# Patient Record
Sex: Male | Born: 1935
Health system: Southern US, Community
[De-identification: ages and names within clinical notes are randomized; demographics above are authoritative.]

## PROBLEM LIST (undated history)

## (undated) DIAGNOSIS — I5030 Unspecified diastolic (congestive) heart failure: Secondary | ICD-10-CM

## (undated) DIAGNOSIS — I10 Essential (primary) hypertension: Secondary | ICD-10-CM

## (undated) DIAGNOSIS — E119 Type 2 diabetes mellitus without complications: Secondary | ICD-10-CM

## (undated) DIAGNOSIS — G4733 Obstructive sleep apnea (adult) (pediatric): Secondary | ICD-10-CM

## (undated) DIAGNOSIS — E785 Hyperlipidemia, unspecified: Secondary | ICD-10-CM

## (undated) DIAGNOSIS — H269 Unspecified cataract: Secondary | ICD-10-CM

## (undated) DIAGNOSIS — N189 Chronic kidney disease, unspecified: Secondary | ICD-10-CM

## (undated) DIAGNOSIS — G4730 Sleep apnea, unspecified: Secondary | ICD-10-CM

## (undated) DIAGNOSIS — Z9989 Dependence on other enabling machines and devices: Secondary | ICD-10-CM

## (undated) DIAGNOSIS — H409 Unspecified glaucoma: Secondary | ICD-10-CM

## (undated) DIAGNOSIS — I639 Cerebral infarction, unspecified: Secondary | ICD-10-CM

## (undated) DIAGNOSIS — K219 Gastro-esophageal reflux disease without esophagitis: Secondary | ICD-10-CM

## (undated) DIAGNOSIS — I4510 Unspecified right bundle-branch block: Secondary | ICD-10-CM

## (undated) DIAGNOSIS — S88119A Complete traumatic amputation at level between knee and ankle, unspecified lower leg, initial encounter: Secondary | ICD-10-CM

## (undated) DIAGNOSIS — D649 Anemia, unspecified: Secondary | ICD-10-CM

## (undated) DIAGNOSIS — K5790 Diverticulosis of intestine, part unspecified, without perforation or abscess without bleeding: Secondary | ICD-10-CM

## (undated) DIAGNOSIS — E079 Disorder of thyroid, unspecified: Secondary | ICD-10-CM

## (undated) HISTORY — DX: Cerebral infarction, unspecified: I63.9

## (undated) HISTORY — DX: Hyperlipidemia, unspecified: E78.5

## (undated) HISTORY — DX: Essential (primary) hypertension: I10

## (undated) HISTORY — DX: Sleep apnea, unspecified: G47.30

## (undated) HISTORY — PX: EYE SURGERY: SHX253

## (undated) HISTORY — DX: Gastro-esophageal reflux disease without esophagitis: K21.9

## (undated) HISTORY — DX: Unspecified glaucoma: H40.9

## (undated) HISTORY — PX: LEG AMPUTATION BELOW KNEE: SHX694

## (undated) HISTORY — DX: Unspecified right bundle-branch block: I45.10

## (undated) HISTORY — DX: Disorder of thyroid, unspecified: E07.9

## (undated) HISTORY — DX: Type 2 diabetes mellitus without complications: E11.9

## (undated) HISTORY — DX: Anemia, unspecified: D64.9

## (undated) HISTORY — DX: Unspecified diastolic (congestive) heart failure: I50.30

## (undated) HISTORY — DX: Chronic kidney disease, unspecified: N18.9

## (undated) HISTORY — DX: Unspecified cataract: H26.9

## (undated) HISTORY — DX: Obstructive sleep apnea (adult) (pediatric): Z99.89

## (undated) HISTORY — DX: Obstructive sleep apnea (adult) (pediatric): G47.33

## (undated) HISTORY — DX: Complete traumatic amputation at level between knee and ankle, unspecified lower leg, initial encounter: S88.119A

---

## 1998-12-02 ENCOUNTER — Encounter: Admission: RE | Admit: 1998-12-02 | Discharge: 1998-12-31 | Payer: Self-pay | Admitting: Internal Medicine

## 1998-12-16 ENCOUNTER — Emergency Department (HOSPITAL_COMMUNITY): Admission: EM | Admit: 1998-12-16 | Discharge: 1998-12-16 | Payer: Self-pay | Admitting: Emergency Medicine

## 1998-12-18 ENCOUNTER — Inpatient Hospital Stay (HOSPITAL_COMMUNITY): Admission: EM | Admit: 1998-12-18 | Discharge: 1998-12-24 | Payer: Self-pay | Admitting: Emergency Medicine

## 1998-12-19 ENCOUNTER — Encounter: Payer: Self-pay | Admitting: General Surgery

## 1998-12-25 ENCOUNTER — Encounter: Admission: RE | Admit: 1998-12-25 | Discharge: 1999-03-25 | Payer: Self-pay

## 1999-01-01 ENCOUNTER — Encounter: Payer: Self-pay | Admitting: General Surgery

## 1999-01-01 ENCOUNTER — Inpatient Hospital Stay (HOSPITAL_COMMUNITY): Admission: RE | Admit: 1999-01-01 | Discharge: 1999-01-27 | Payer: Self-pay | Admitting: General Surgery

## 1999-01-05 ENCOUNTER — Encounter: Payer: Self-pay | Admitting: General Surgery

## 1999-01-13 ENCOUNTER — Encounter: Admission: RE | Admit: 1999-01-13 | Discharge: 1999-03-15 | Payer: Self-pay | Admitting: Internal Medicine

## 1999-01-26 ENCOUNTER — Encounter: Payer: Self-pay | Admitting: General Surgery

## 1999-03-01 ENCOUNTER — Inpatient Hospital Stay (HOSPITAL_COMMUNITY): Admission: RE | Admit: 1999-03-01 | Discharge: 1999-03-13 | Payer: Self-pay | Admitting: General Surgery

## 1999-03-09 ENCOUNTER — Encounter: Payer: Self-pay | Admitting: General Surgery

## 1999-03-10 ENCOUNTER — Encounter: Payer: Self-pay | Admitting: General Surgery

## 1999-03-29 ENCOUNTER — Encounter: Admission: RE | Admit: 1999-03-29 | Discharge: 1999-06-27 | Payer: Self-pay | Admitting: General Surgery

## 1999-03-31 ENCOUNTER — Encounter: Admission: RE | Admit: 1999-03-31 | Discharge: 1999-06-29 | Payer: Self-pay | Admitting: Orthopedic Surgery

## 1999-05-12 ENCOUNTER — Encounter: Admission: RE | Admit: 1999-05-12 | Discharge: 1999-08-10 | Payer: Self-pay | Admitting: Endocrinology

## 1999-05-27 ENCOUNTER — Encounter
Admission: RE | Admit: 1999-05-27 | Discharge: 1999-07-01 | Payer: Self-pay | Admitting: Physical Medicine and Rehabilitation

## 1999-06-29 ENCOUNTER — Encounter: Admission: RE | Admit: 1999-06-29 | Discharge: 1999-09-27 | Payer: Self-pay | Admitting: Internal Medicine

## 1999-09-28 ENCOUNTER — Encounter: Admission: RE | Admit: 1999-09-28 | Discharge: 1999-12-27 | Payer: Self-pay | Admitting: Internal Medicine

## 2000-01-05 ENCOUNTER — Encounter: Admission: RE | Admit: 2000-01-05 | Discharge: 2000-01-11 | Payer: Self-pay | Admitting: Internal Medicine

## 2000-04-12 ENCOUNTER — Encounter: Admission: RE | Admit: 2000-04-12 | Discharge: 2000-07-11 | Payer: Self-pay | Admitting: Orthopedic Surgery

## 2000-08-01 ENCOUNTER — Encounter: Admission: RE | Admit: 2000-08-01 | Discharge: 2000-08-07 | Payer: Self-pay | Admitting: Orthopedic Surgery

## 2000-08-09 ENCOUNTER — Encounter: Admission: RE | Admit: 2000-08-09 | Discharge: 2000-11-07 | Payer: Self-pay | Admitting: Endocrinology

## 2000-11-08 ENCOUNTER — Encounter: Admission: RE | Admit: 2000-11-08 | Discharge: 2000-11-13 | Payer: Self-pay | Admitting: Internal Medicine

## 2001-02-28 ENCOUNTER — Encounter: Admission: RE | Admit: 2001-02-28 | Discharge: 2001-03-05 | Payer: Self-pay | Admitting: Internal Medicine

## 2001-06-20 ENCOUNTER — Encounter: Admission: RE | Admit: 2001-06-20 | Discharge: 2001-06-25 | Payer: Self-pay | Admitting: Internal Medicine

## 2001-09-18 ENCOUNTER — Encounter (HOSPITAL_BASED_OUTPATIENT_CLINIC_OR_DEPARTMENT_OTHER): Admission: RE | Admit: 2001-09-18 | Discharge: 2001-09-24 | Payer: Self-pay | Admitting: Orthopedic Surgery

## 2001-12-21 ENCOUNTER — Encounter (HOSPITAL_BASED_OUTPATIENT_CLINIC_OR_DEPARTMENT_OTHER): Admission: RE | Admit: 2001-12-21 | Discharge: 2001-12-28 | Payer: Self-pay | Admitting: Internal Medicine

## 2002-03-28 ENCOUNTER — Encounter (HOSPITAL_BASED_OUTPATIENT_CLINIC_OR_DEPARTMENT_OTHER): Admission: RE | Admit: 2002-03-28 | Discharge: 2002-06-26 | Payer: Self-pay | Admitting: Internal Medicine

## 2002-07-16 ENCOUNTER — Encounter (HOSPITAL_BASED_OUTPATIENT_CLINIC_OR_DEPARTMENT_OTHER): Admission: RE | Admit: 2002-07-16 | Discharge: 2002-10-14 | Payer: Self-pay | Admitting: Internal Medicine

## 2002-10-17 ENCOUNTER — Encounter (HOSPITAL_BASED_OUTPATIENT_CLINIC_OR_DEPARTMENT_OTHER): Admission: RE | Admit: 2002-10-17 | Discharge: 2003-01-15 | Payer: Self-pay | Admitting: Internal Medicine

## 2003-01-17 ENCOUNTER — Encounter (HOSPITAL_BASED_OUTPATIENT_CLINIC_OR_DEPARTMENT_OTHER): Admission: RE | Admit: 2003-01-17 | Discharge: 2003-02-10 | Payer: Self-pay | Admitting: Internal Medicine

## 2003-05-15 ENCOUNTER — Encounter (HOSPITAL_BASED_OUTPATIENT_CLINIC_OR_DEPARTMENT_OTHER): Admission: RE | Admit: 2003-05-15 | Discharge: 2003-05-28 | Payer: Self-pay | Admitting: Internal Medicine

## 2003-08-21 ENCOUNTER — Encounter (HOSPITAL_BASED_OUTPATIENT_CLINIC_OR_DEPARTMENT_OTHER): Admission: RE | Admit: 2003-08-21 | Discharge: 2003-09-04 | Payer: Self-pay | Admitting: Internal Medicine

## 2003-11-25 ENCOUNTER — Encounter (HOSPITAL_BASED_OUTPATIENT_CLINIC_OR_DEPARTMENT_OTHER): Admission: RE | Admit: 2003-11-25 | Discharge: 2004-02-23 | Payer: Self-pay | Admitting: Internal Medicine

## 2004-03-03 ENCOUNTER — Encounter (HOSPITAL_BASED_OUTPATIENT_CLINIC_OR_DEPARTMENT_OTHER): Admission: RE | Admit: 2004-03-03 | Discharge: 2004-03-17 | Payer: Self-pay | Admitting: Internal Medicine

## 2004-06-16 ENCOUNTER — Encounter (HOSPITAL_BASED_OUTPATIENT_CLINIC_OR_DEPARTMENT_OTHER): Admission: RE | Admit: 2004-06-16 | Discharge: 2004-07-06 | Payer: Self-pay | Admitting: Internal Medicine

## 2009-09-18 HISTORY — PX: NM MYOCAR PERF WALL MOTION: HXRAD629

## 2010-06-13 ENCOUNTER — Emergency Department (HOSPITAL_COMMUNITY)
Admission: EM | Admit: 2010-06-13 | Discharge: 2010-06-13 | Payer: Self-pay | Source: Home / Self Care | Admitting: Emergency Medicine

## 2010-06-13 IMAGING — CR DG KNEE COMPLETE 4+V*L*
4 series · 4 of 4 positions shown · non-contrast
Comparison: None.

CLINICAL DATA: Left knee pain and swelling.

LEFT KNEE - COMPLETE 4+ VIEW

[t knee ap left]
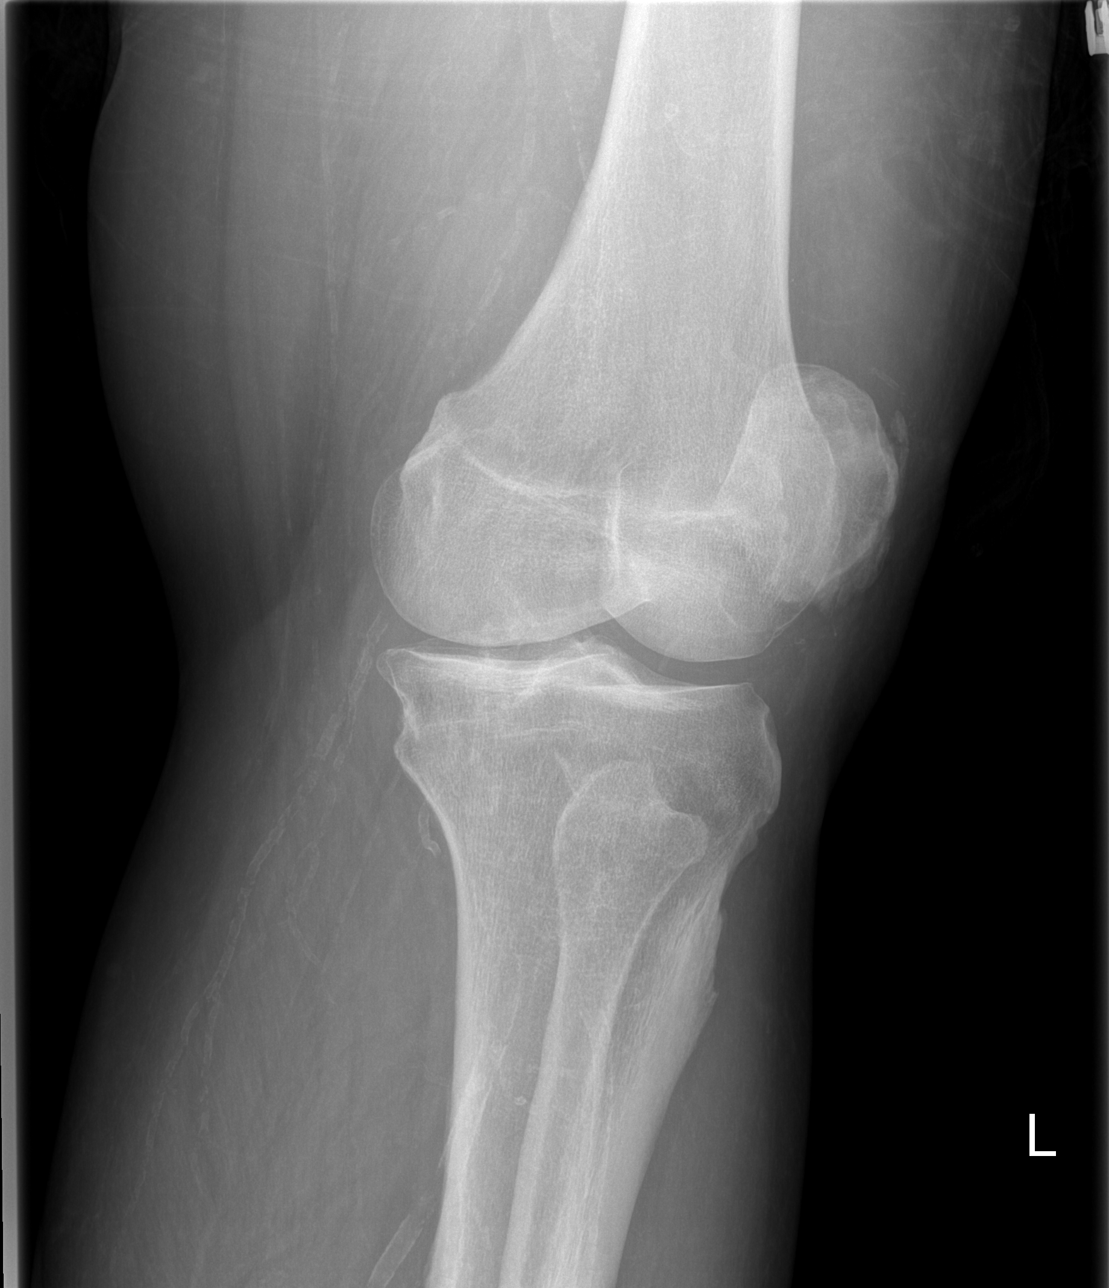

[t knee oblique left (1 of 2)]
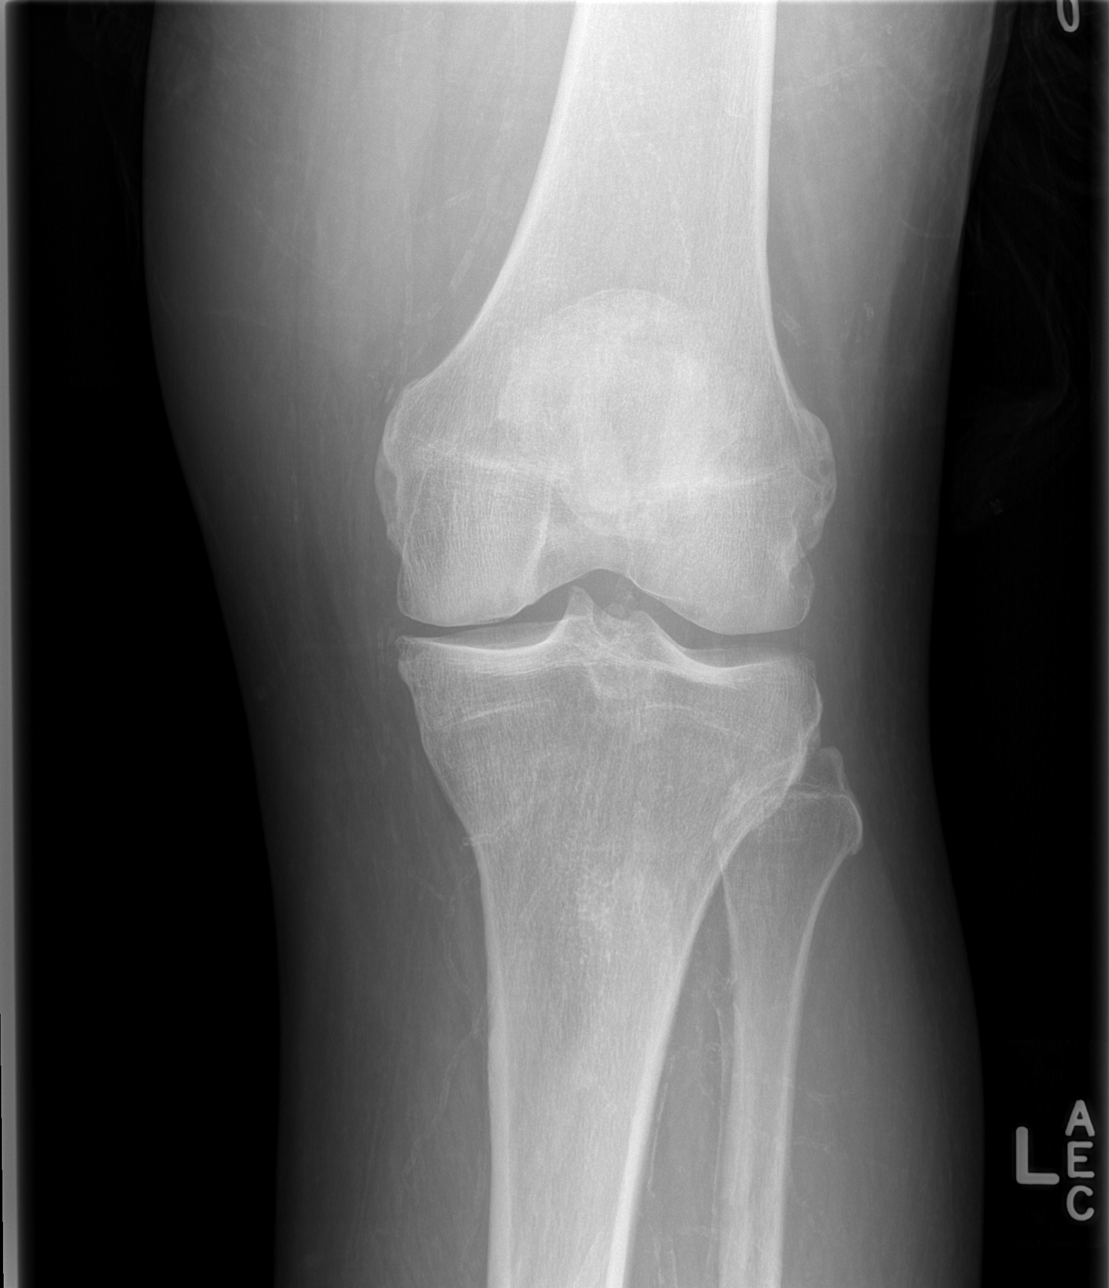

[t knee oblique left (2 of 2)]
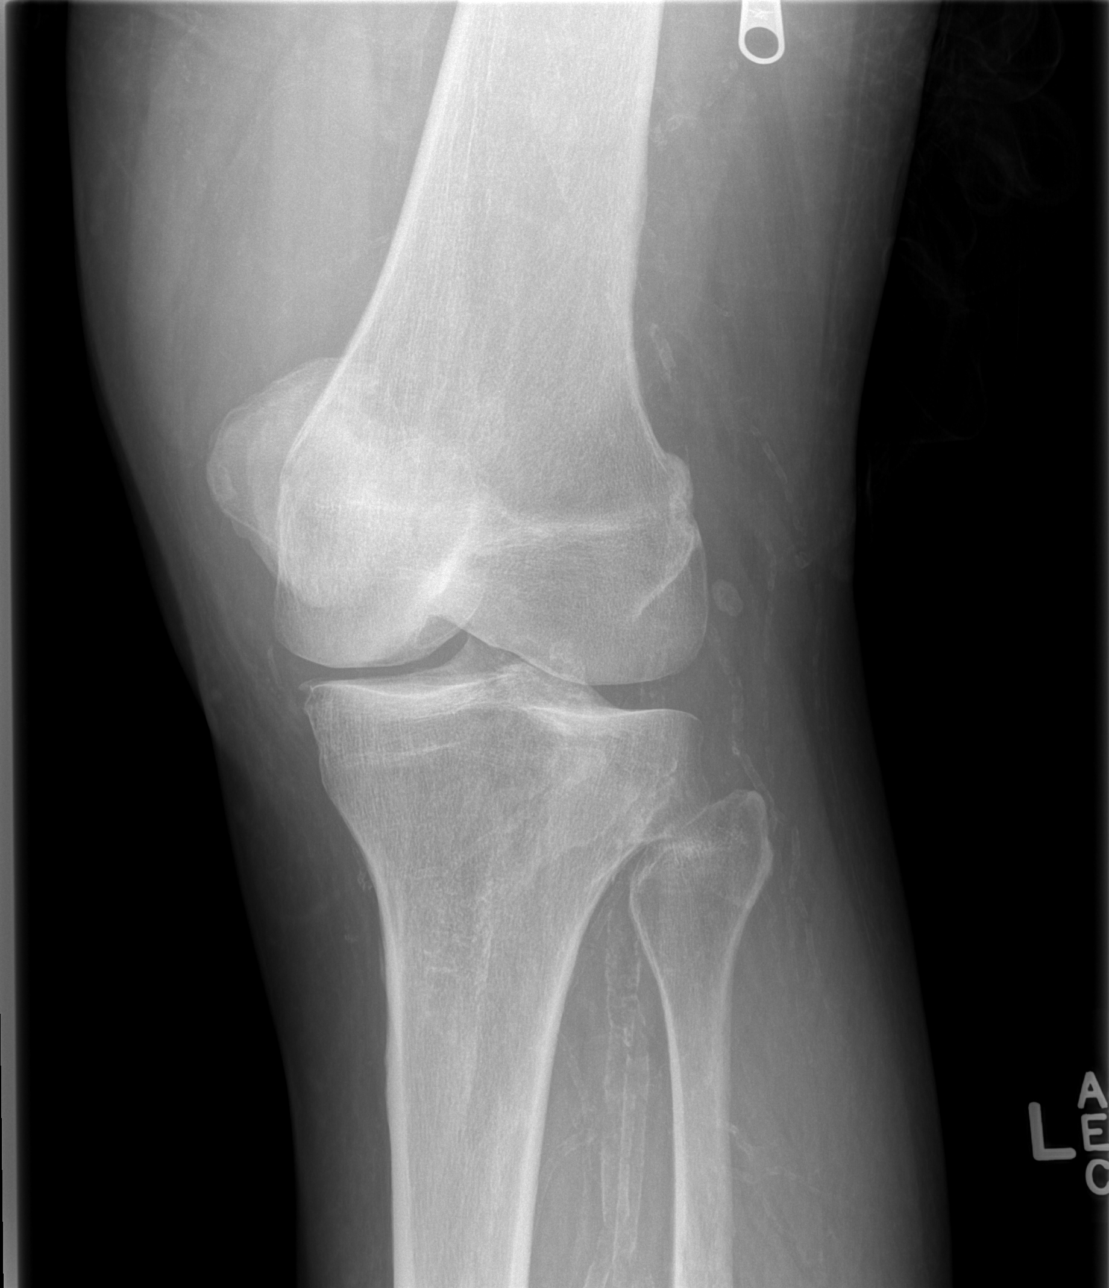

[view not recorded]
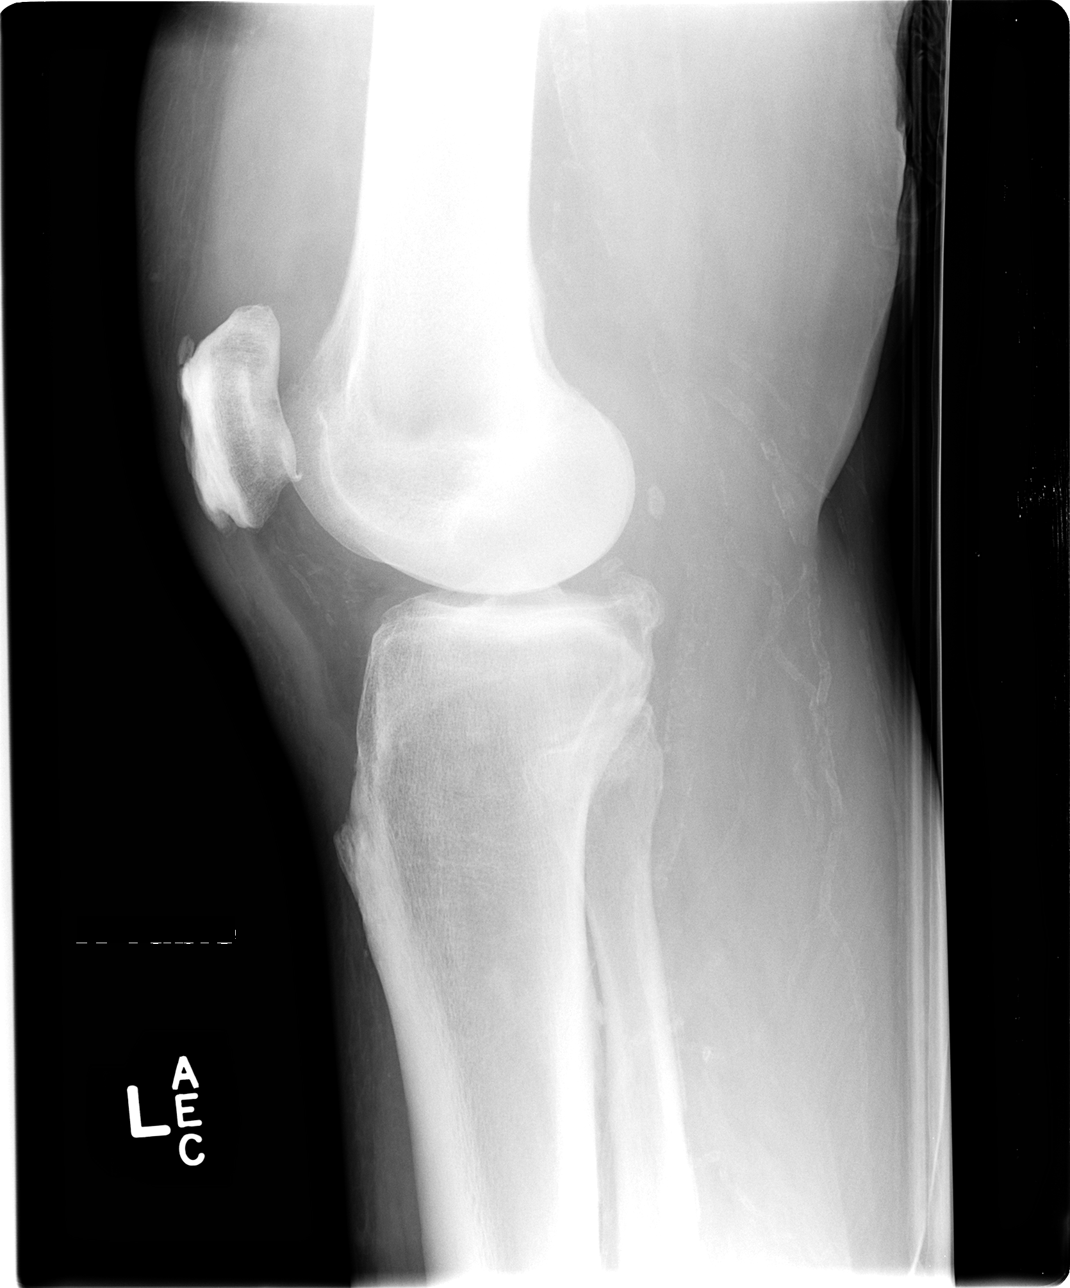

[4 of 4 positions shown; findings below may reference images not displayed]

FINDINGS: There is moderately severe medial joint space narrowing
with associated proliferative changes.  Patellofemoral
proliferative changes and proliferation of the tibial spines also
noted.  There is an associated suprapatellar joint effusion.
Findings are consistent with osteoarthritis.  No fracture,
dislocation or bony lesion is identified.  There is extensive
vascular calcification throughout the visualized soft tissues
likely consistent with calcifications related to underlying
diabetes.
IMPRESSION: Osteoarthritis primarily affecting the medial joint space.  There
also is patellofemoral disease and a suprapatellar joint effusion.

## 2011-01-08 ENCOUNTER — Inpatient Hospital Stay (HOSPITAL_COMMUNITY)
Admission: EM | Admit: 2011-01-08 | Discharge: 2011-01-14 | DRG: 811 | Disposition: A | Discharge status: Home / Self Care | Payer: Medicare Other | Attending: Internal Medicine | Admitting: Internal Medicine

## 2011-01-08 DIAGNOSIS — N179 Acute kidney failure, unspecified: Secondary | ICD-10-CM | POA: Diagnosis present

## 2011-01-08 DIAGNOSIS — R791 Abnormal coagulation profile: Secondary | ICD-10-CM | POA: Diagnosis present

## 2011-01-08 DIAGNOSIS — G4733 Obstructive sleep apnea (adult) (pediatric): Secondary | ICD-10-CM | POA: Diagnosis present

## 2011-01-08 DIAGNOSIS — T45515A Adverse effect of anticoagulants, initial encounter: Secondary | ICD-10-CM | POA: Diagnosis present

## 2011-01-08 DIAGNOSIS — D62 Acute posthemorrhagic anemia: Principal | ICD-10-CM | POA: Diagnosis present

## 2011-01-08 DIAGNOSIS — D126 Benign neoplasm of colon, unspecified: Secondary | ICD-10-CM | POA: Diagnosis present

## 2011-01-08 DIAGNOSIS — R195 Other fecal abnormalities: Secondary | ICD-10-CM | POA: Diagnosis present

## 2011-01-08 DIAGNOSIS — N183 Chronic kidney disease, stage 3 unspecified: Secondary | ICD-10-CM | POA: Diagnosis present

## 2011-01-08 DIAGNOSIS — M109 Gout, unspecified: Secondary | ICD-10-CM | POA: Diagnosis present

## 2011-01-08 DIAGNOSIS — K6381 Dieulafoy lesion of intestine: Secondary | ICD-10-CM | POA: Diagnosis present

## 2011-01-08 DIAGNOSIS — Z8673 Personal history of transient ischemic attack (TIA), and cerebral infarction without residual deficits: Secondary | ICD-10-CM

## 2011-01-08 DIAGNOSIS — R0602 Shortness of breath: Secondary | ICD-10-CM | POA: Diagnosis present

## 2011-01-08 DIAGNOSIS — I129 Hypertensive chronic kidney disease with stage 1 through stage 4 chronic kidney disease, or unspecified chronic kidney disease: Secondary | ICD-10-CM | POA: Diagnosis present

## 2011-01-08 DIAGNOSIS — E785 Hyperlipidemia, unspecified: Secondary | ICD-10-CM | POA: Diagnosis present

## 2011-01-08 HISTORY — DX: Diverticulosis of intestine, part unspecified, without perforation or abscess without bleeding: K57.90

## 2011-01-08 LAB — COMPREHENSIVE METABOLIC PANEL
ALT: 15 U/L (ref 0–53)
AST: 20 U/L (ref 0–37)
Albumin: 2.8 g/dL — ABNORMAL LOW (ref 3.5–5.2)
Alkaline Phosphatase: 43 U/L (ref 39–117)
BUN: 71 mg/dL — ABNORMAL HIGH (ref 6–23)
CO2: 23 mEq/L (ref 19–32)
Calcium: 9.3 mg/dL (ref 8.4–10.5)
Chloride: 107 mEq/L (ref 96–112)
Creatinine, Ser: 2.1 mg/dL — ABNORMAL HIGH (ref 0.50–1.35)
GFR calc Af Amer: 38 mL/min — ABNORMAL LOW (ref >60)
GFR calc non Af Amer: 31 mL/min — ABNORMAL LOW (ref >60)
Glucose, Bld: 254 mg/dL — ABNORMAL HIGH (ref 70–99)
Potassium: 4.1 mEq/L (ref 3.5–5.1)
Sodium: 137 mEq/L (ref 135–145)
Total Bilirubin: 0.2 mg/dL — ABNORMAL LOW (ref 0.3–1.2)
Total Protein: 6.3 g/dL (ref 6.0–8.3)

## 2011-01-08 LAB — URINALYSIS, ROUTINE W REFLEX MICROSCOPIC
Bilirubin Urine: NEGATIVE
Glucose, UA: 250 mg/dL — AB
Hgb urine dipstick: NEGATIVE
Ketones, ur: NEGATIVE mg/dL
Leukocytes, UA: NEGATIVE
Nitrite: NEGATIVE
Protein, ur: NEGATIVE mg/dL
Specific Gravity, Urine: 1.018 (ref 1.005–1.030)
Urobilinogen, UA: 0.2 mg/dL (ref 0.0–1.0)
pH: 5.5 (ref 5.0–8.0)

## 2011-01-08 LAB — ABO/RH: ABO/RH(D): B POS

## 2011-01-08 LAB — MRSA PCR SCREENING: MRSA by PCR: NEGATIVE

## 2011-01-08 LAB — CBC
HCT: 12.3 % — ABNORMAL LOW (ref 39.0–52.0)
Hemoglobin: 3.8 g/dL — CL (ref 13.0–17.0)
MCH: 26.4 pg (ref 26.0–34.0)
MCHC: 30.9 g/dL (ref 30.0–36.0)
MCV: 85.4 fL (ref 78.0–100.0)
Platelets: 197 10*3/uL (ref 150–400)
RBC: 1.44 MIL/uL — ABNORMAL LOW (ref 4.22–5.81)
RDW: 18.3 % — ABNORMAL HIGH (ref 11.5–15.5)
WBC: 8.9 10*3/uL (ref 4.0–10.5)

## 2011-01-08 LAB — DIFFERENTIAL
Basophils Absolute: 0 10*3/uL (ref 0.0–0.1)
Basophils Relative: 0 % (ref 0–1)
Eosinophils Absolute: 0.2 10*3/uL (ref 0.0–0.7)
Eosinophils Relative: 2 % (ref 0–5)
Lymphocytes Relative: 17 % (ref 12–46)
Lymphs Abs: 1.5 10*3/uL (ref 0.7–4.0)
Monocytes Absolute: 0.5 10*3/uL (ref 0.1–1.0)
Monocytes Relative: 6 % (ref 3–12)
Neutro Abs: 6.7 10*3/uL (ref 1.7–7.7)
Neutrophils Relative %: 75 % (ref 43–77)

## 2011-01-08 LAB — PROTIME-INR
INR: 6.09 (ref 0.00–1.49)
Prothrombin Time: 55 seconds — ABNORMAL HIGH (ref 11.6–15.2)

## 2011-01-09 ENCOUNTER — Inpatient Hospital Stay (HOSPITAL_COMMUNITY): Payer: Medicare Other

## 2011-01-09 DIAGNOSIS — I517 Cardiomegaly: Secondary | ICD-10-CM

## 2011-01-09 LAB — GLUCOSE, CAPILLARY
Glucose-Capillary: 194 mg/dL — ABNORMAL HIGH (ref 70–99)
Glucose-Capillary: 193 mg/dL — ABNORMAL HIGH (ref 70–99)

## 2011-01-09 LAB — CARDIAC PANEL(CRET KIN+CKTOT+MB+TROPI)
CK, MB: 3.6 ng/mL (ref 0.3–4.0)
CK, MB: 3.8 ng/mL (ref 0.3–4.0)
CK, MB: 4.7 ng/mL — ABNORMAL HIGH (ref 0.3–4.0)
Relative Index: 1.4 (ref 0.0–2.5)
Relative Index: 1.6 (ref 0.0–2.5)
Relative Index: 1.6 (ref 0.0–2.5)
Total CK: 240 U/L — ABNORMAL HIGH (ref 7–232)
Total CK: 255 U/L — ABNORMAL HIGH (ref 7–232)
Total CK: 296 U/L — ABNORMAL HIGH (ref 7–232)
Troponin I: <0.3 ng/mL (ref <0.30)
Troponin I: <0.3 ng/mL (ref <0.30)
Troponin I: <0.3 ng/mL (ref <0.30)

## 2011-01-09 LAB — CBC
HCT: 16.7 % — ABNORMAL LOW (ref 39.0–52.0)
HCT: 19.3 % — ABNORMAL LOW (ref 39.0–52.0)
Hemoglobin: 5.3 g/dL — CL (ref 13.0–17.0)
Hemoglobin: 6.1 g/dL — CL (ref 13.0–17.0)
MCH: 26.5 pg (ref 26.0–34.0)
MCH: 26.9 pg (ref 26.0–34.0)
MCHC: 31.6 g/dL (ref 30.0–36.0)
MCHC: 31.7 g/dL (ref 30.0–36.0)
MCV: 83.9 fL (ref 78.0–100.0)
MCV: 84.8 fL (ref 78.0–100.0)
Platelets: 177 10*3/uL (ref 150–400)
Platelets: 178 10*3/uL (ref 150–400)
RBC: 1.97 MIL/uL — ABNORMAL LOW (ref 4.22–5.81)
RBC: 2.3 MIL/uL — ABNORMAL LOW (ref 4.22–5.81)
RDW: 17.2 % — ABNORMAL HIGH (ref 11.5–15.5)
RDW: 17.3 % — ABNORMAL HIGH (ref 11.5–15.5)
WBC: 7.5 10*3/uL (ref 4.0–10.5)
WBC: 8 10*3/uL (ref 4.0–10.5)

## 2011-01-09 LAB — COMPREHENSIVE METABOLIC PANEL
ALT: 16 U/L (ref 0–53)
AST: 18 U/L (ref 0–37)
Albumin: 2.8 g/dL — ABNORMAL LOW (ref 3.5–5.2)
Alkaline Phosphatase: 41 U/L (ref 39–117)
BUN: 55 mg/dL — ABNORMAL HIGH (ref 6–23)
CO2: 25 mEq/L (ref 19–32)
Calcium: 9 mg/dL (ref 8.4–10.5)
Chloride: 109 mEq/L (ref 96–112)
Creatinine, Ser: 1.75 mg/dL — ABNORMAL HIGH (ref 0.50–1.35)
GFR calc Af Amer: 46 mL/min — ABNORMAL LOW (ref >60)
GFR calc non Af Amer: 38 mL/min — ABNORMAL LOW (ref >60)
Glucose, Bld: 170 mg/dL — ABNORMAL HIGH (ref 70–99)
Potassium: 3.6 mEq/L (ref 3.5–5.1)
Sodium: 141 mEq/L (ref 135–145)
Total Bilirubin: 0.4 mg/dL (ref 0.3–1.2)
Total Protein: 6.3 g/dL (ref 6.0–8.3)

## 2011-01-09 LAB — PREPARE RBC (CROSSMATCH)

## 2011-01-09 LAB — MAGNESIUM: Magnesium: 2 mg/dL (ref 1.5–2.5)

## 2011-01-09 LAB — PRO B NATRIURETIC PEPTIDE: Pro B Natriuretic peptide (BNP): 197.2 pg/mL — ABNORMAL HIGH (ref 0–125)

## 2011-01-09 LAB — HEMOGLOBIN AND HEMATOCRIT, BLOOD
HCT: 21.9 % — ABNORMAL LOW (ref 39.0–52.0)
Hemoglobin: 7.2 g/dL — ABNORMAL LOW (ref 13.0–17.0)

## 2011-01-09 LAB — HEMOGLOBIN A1C
Hgb A1c MFr Bld: 6.7 % — ABNORMAL HIGH (ref <5.7)
Mean Plasma Glucose: 146 mg/dL — ABNORMAL HIGH (ref <117)

## 2011-01-09 LAB — TSH: TSH: 2.817 u[IU]/mL (ref 0.350–4.500)

## 2011-01-09 LAB — PROTIME-INR
INR: 1.81 — ABNORMAL HIGH (ref 0.00–1.49)
Prothrombin Time: 21.3 seconds — ABNORMAL HIGH (ref 11.6–15.2)

## 2011-01-09 LAB — PHOSPHORUS: Phosphorus: 3.7 mg/dL (ref 2.3–4.6)

## 2011-01-09 IMAGING — CR DG CHEST 1V PORT
1 series · 1 of 1 positions shown · non-contrast
Comparison: None.

CLINICAL DATA: Shortness of breath

PORTABLE CHEST - 1 VIEW

[view not recorded]
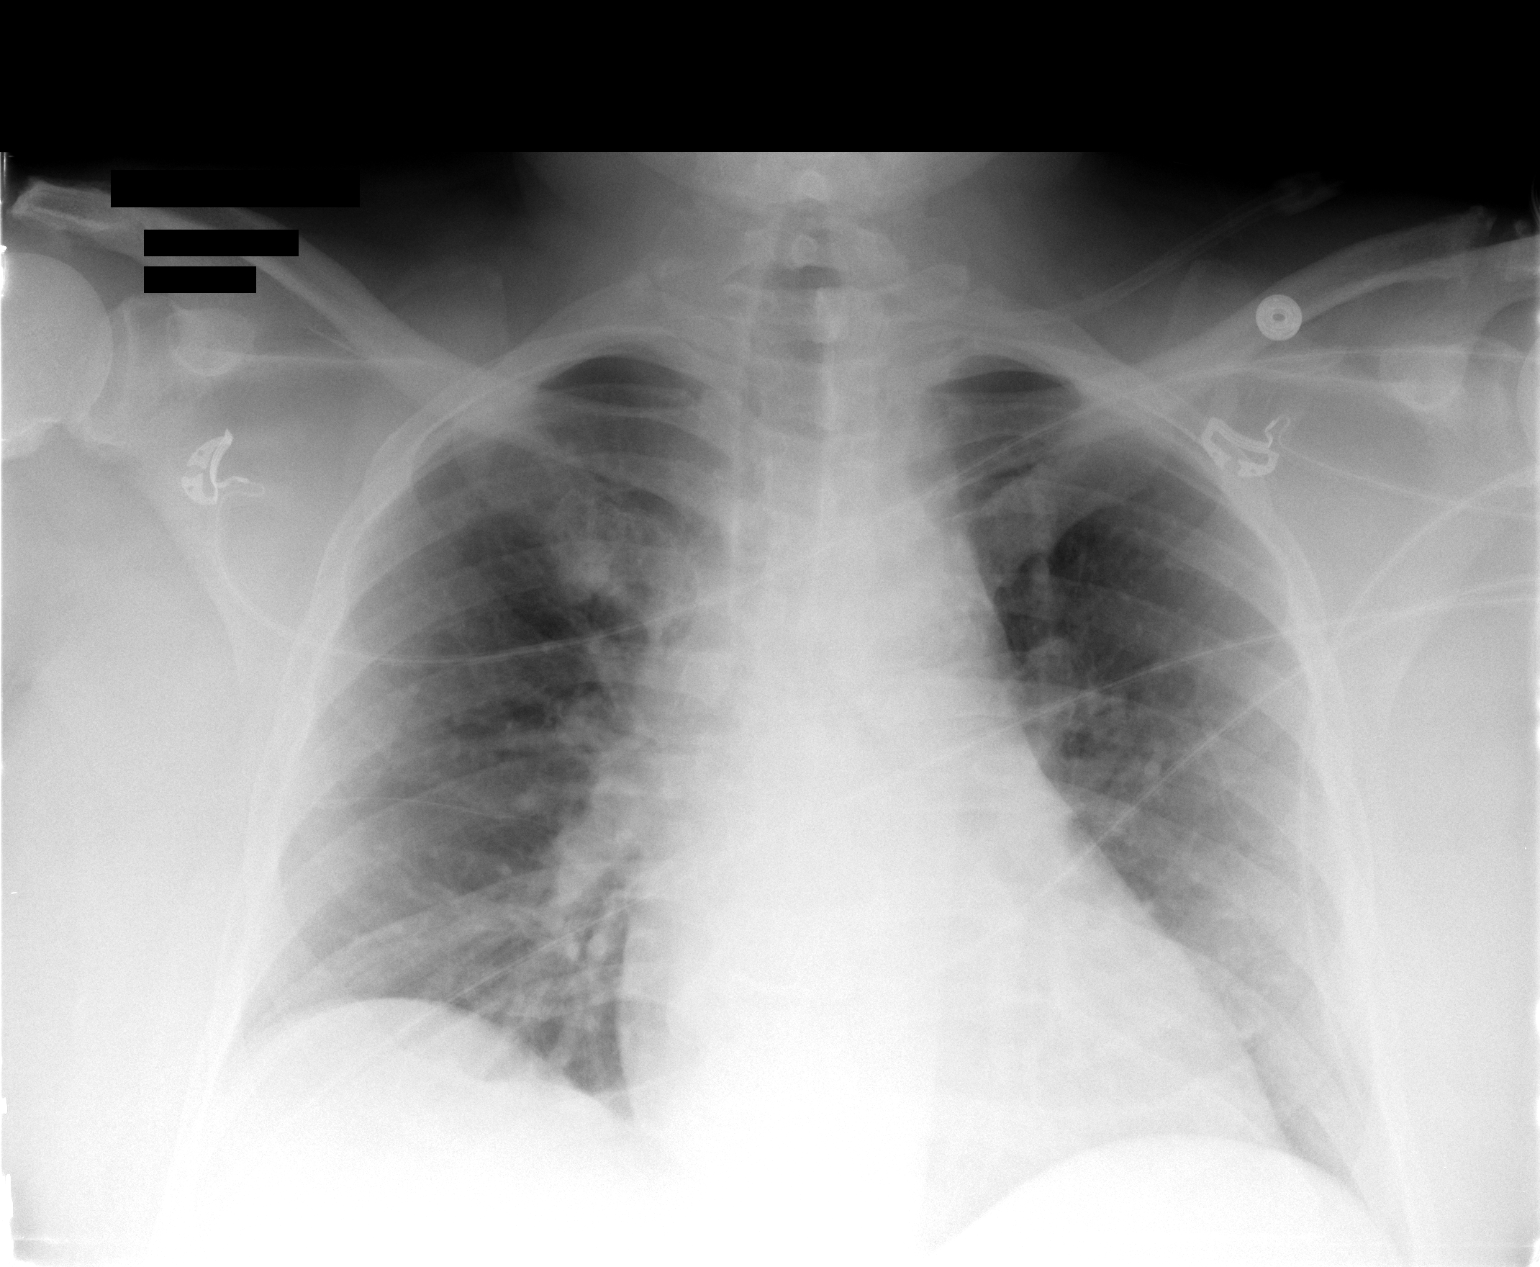

[1 of 1 positions shown; findings below may reference images not displayed]

FINDINGS: Borderline heart size and central vascular congestion.
Negative for CHF, definite pneumonia, significant effusion or
pneumothorax.
IMPRESSION: Negative for acute pneumonia or CHF.

## 2011-01-10 LAB — PREPARE FRESH FROZEN PLASMA
Unit division: 0
Unit division: 0

## 2011-01-10 LAB — TYPE AND SCREEN
ABO/RH(D): B POS
Antibody Screen: NEGATIVE
Unit division: 0
Unit division: 0
Unit division: 0
Unit division: 0

## 2011-01-10 LAB — BASIC METABOLIC PANEL
BUN: 40 mg/dL — ABNORMAL HIGH (ref 6–23)
CO2: 27 mEq/L (ref 19–32)
Calcium: 9 mg/dL (ref 8.4–10.5)
Chloride: 108 mEq/L (ref 96–112)
Creatinine, Ser: 1.53 mg/dL — ABNORMAL HIGH (ref 0.50–1.35)
GFR calc Af Amer: 54 mL/min — ABNORMAL LOW (ref >60)
GFR calc non Af Amer: 45 mL/min — ABNORMAL LOW (ref >60)
Glucose, Bld: 89 mg/dL (ref 70–99)
Potassium: 3.6 mEq/L (ref 3.5–5.1)
Sodium: 141 mEq/L (ref 135–145)

## 2011-01-10 LAB — GLUCOSE, CAPILLARY
Glucose-Capillary: 233 mg/dL — ABNORMAL HIGH (ref 70–99)
Glucose-Capillary: 137 mg/dL — ABNORMAL HIGH (ref 70–99)
Glucose-Capillary: 101 mg/dL — ABNORMAL HIGH (ref 70–99)
Glucose-Capillary: 122 mg/dL — ABNORMAL HIGH (ref 70–99)

## 2011-01-10 LAB — OCCULT BLOOD X 1 CARD TO LAB, STOOL: Fecal Occult Bld: POSITIVE

## 2011-01-10 LAB — HEMOGLOBIN AND HEMATOCRIT, BLOOD
HCT: 21.9 % — ABNORMAL LOW (ref 39.0–52.0)
HCT: 22.7 % — ABNORMAL LOW (ref 39.0–52.0)
Hemoglobin: 7.2 g/dL — ABNORMAL LOW (ref 13.0–17.0)
Hemoglobin: 7.3 g/dL — ABNORMAL LOW (ref 13.0–17.0)

## 2011-01-10 LAB — OCCULT BLOOD, POC DEVICE: Fecal Occult Bld: POSITIVE

## 2011-01-11 ENCOUNTER — Other Ambulatory Visit: Payer: Self-pay | Admitting: Gastroenterology

## 2011-01-11 LAB — GLUCOSE, CAPILLARY
Glucose-Capillary: 97 mg/dL (ref 70–99)
Glucose-Capillary: 99 mg/dL (ref 70–99)
Glucose-Capillary: 134 mg/dL — ABNORMAL HIGH (ref 70–99)
Glucose-Capillary: 130 mg/dL — ABNORMAL HIGH (ref 70–99)
Glucose-Capillary: 99 mg/dL (ref 70–99)

## 2011-01-11 LAB — CBC
HCT: 22.7 % — ABNORMAL LOW (ref 39.0–52.0)
Hemoglobin: 7.3 g/dL — ABNORMAL LOW (ref 13.0–17.0)
MCH: 27 pg (ref 26.0–34.0)
MCHC: 32.2 g/dL (ref 30.0–36.0)
MCV: 84.1 fL (ref 78.0–100.0)
Platelets: 199 10*3/uL (ref 150–400)
RBC: 2.7 MIL/uL — ABNORMAL LOW (ref 4.22–5.81)
RDW: 16.7 % — ABNORMAL HIGH (ref 11.5–15.5)
WBC: 8.5 10*3/uL (ref 4.0–10.5)

## 2011-01-11 LAB — BASIC METABOLIC PANEL
BUN: 23 mg/dL (ref 6–23)
CO2: 25 mEq/L (ref 19–32)
Calcium: 8.9 mg/dL (ref 8.4–10.5)
Chloride: 106 mEq/L (ref 96–112)
Creatinine, Ser: 1.42 mg/dL — ABNORMAL HIGH (ref 0.50–1.35)
GFR calc Af Amer: 59 mL/min — ABNORMAL LOW (ref >60)
GFR calc non Af Amer: 49 mL/min — ABNORMAL LOW (ref >60)
Glucose, Bld: 100 mg/dL — ABNORMAL HIGH (ref 70–99)
Potassium: 3.7 mEq/L (ref 3.5–5.1)
Sodium: 138 mEq/L (ref 135–145)

## 2011-01-12 ENCOUNTER — Encounter (HOSPITAL_COMMUNITY): Payer: Self-pay | Admitting: Radiology

## 2011-01-12 ENCOUNTER — Inpatient Hospital Stay (HOSPITAL_COMMUNITY): Payer: Medicare Other

## 2011-01-12 LAB — GLUCOSE, CAPILLARY
Glucose-Capillary: 112 mg/dL — ABNORMAL HIGH (ref 70–99)
Glucose-Capillary: 121 mg/dL — ABNORMAL HIGH (ref 70–99)
Glucose-Capillary: 236 mg/dL — ABNORMAL HIGH (ref 70–99)
Glucose-Capillary: 169 mg/dL — ABNORMAL HIGH (ref 70–99)

## 2011-01-12 LAB — BASIC METABOLIC PANEL
BUN: 27 mg/dL — ABNORMAL HIGH (ref 6–23)
CO2: 23 mEq/L (ref 19–32)
Calcium: 8.7 mg/dL (ref 8.4–10.5)
Chloride: 105 mEq/L (ref 96–112)
Creatinine, Ser: 1.87 mg/dL — ABNORMAL HIGH (ref 0.50–1.35)
GFR calc Af Amer: 43 mL/min — ABNORMAL LOW (ref >60)
GFR calc non Af Amer: 36 mL/min — ABNORMAL LOW (ref >60)
Glucose, Bld: 104 mg/dL — ABNORMAL HIGH (ref 70–99)
Potassium: 4 mEq/L (ref 3.5–5.1)
Sodium: 139 mEq/L (ref 135–145)

## 2011-01-12 LAB — CBC
HCT: 21.6 % — ABNORMAL LOW (ref 39.0–52.0)
Hemoglobin: 6.8 g/dL — CL (ref 13.0–17.0)
MCH: 26.5 pg (ref 26.0–34.0)
MCHC: 31.5 g/dL (ref 30.0–36.0)
MCV: 84 fL (ref 78.0–100.0)
Platelets: 173 10*3/uL (ref 150–400)
RBC: 2.57 MIL/uL — ABNORMAL LOW (ref 4.22–5.81)
RDW: 16.2 % — ABNORMAL HIGH (ref 11.5–15.5)
WBC: 10 10*3/uL (ref 4.0–10.5)

## 2011-01-12 LAB — HEMOGLOBIN AND HEMATOCRIT, BLOOD
HCT: 24.4 % — ABNORMAL LOW (ref 39.0–52.0)
Hemoglobin: 8.1 g/dL — ABNORMAL LOW (ref 13.0–17.0)

## 2011-01-12 IMAGING — CT CT ABD-PELV W/O CM
2 of 4 series · 16 of 46 positions shown, 18 images · non-contrast
Comparison: None.

CLINICAL DATA: 75-year-old with low hemoglobin and melanotic stool.
History of diverticulosis

CT ABDOMEN AND PELVIS WITHOUT CONTRAST
TECHNIQUE: Multidetector CT imaging of the abdomen and pelvis was
performed following the standard protocol without intravenous
contrast. Oral contrast was administered for this exam.

[Series 2: rtn ap without · axial · non-contrast · 0.85mm/px · z∈[+699,+1184]mm · 13 of 107 slices shown, 15 images]
[im 5/107  soft-tissue]
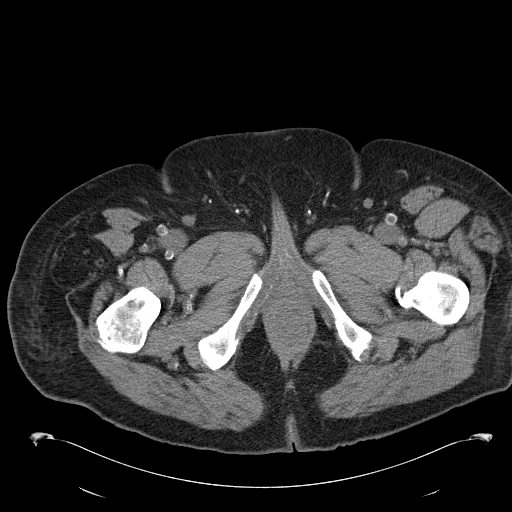
[im 5/107  bone]
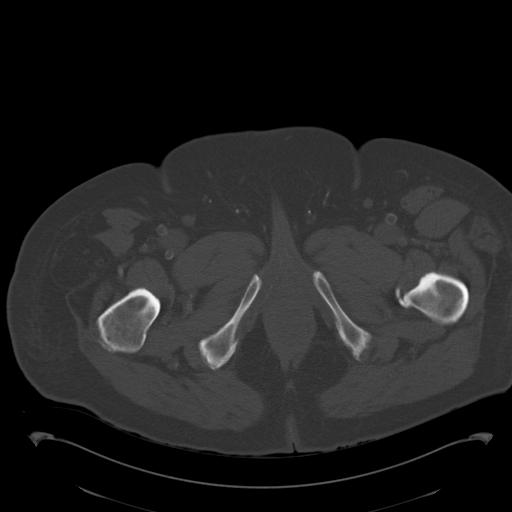
[im 13/107  soft-tissue]
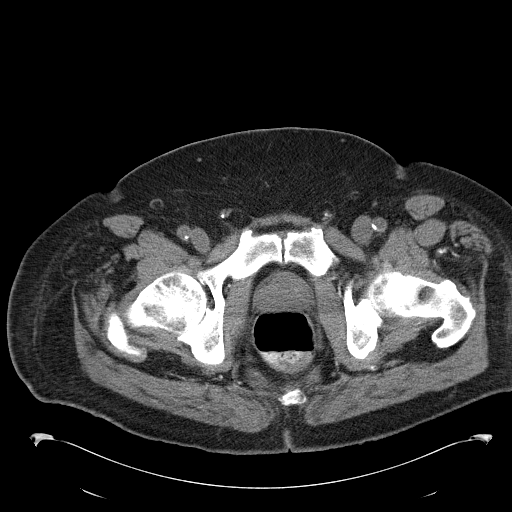
[im 22/107  soft-tissue]
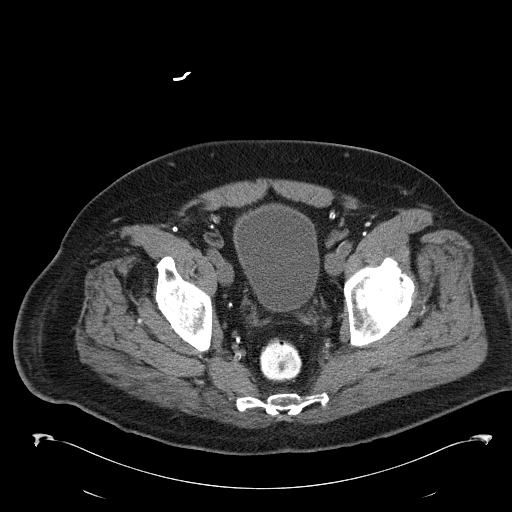
[im 30/107  soft-tissue]
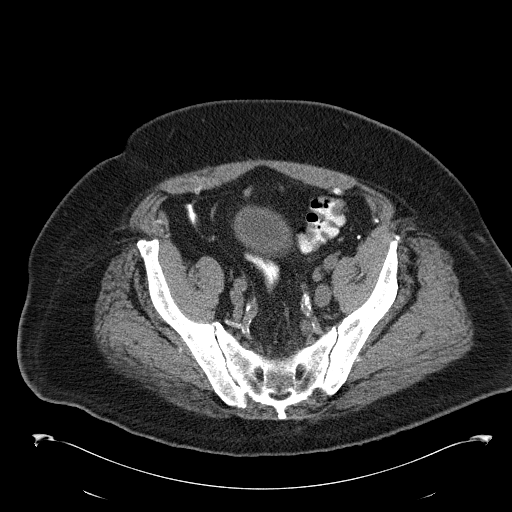
[im 39/107  soft-tissue]
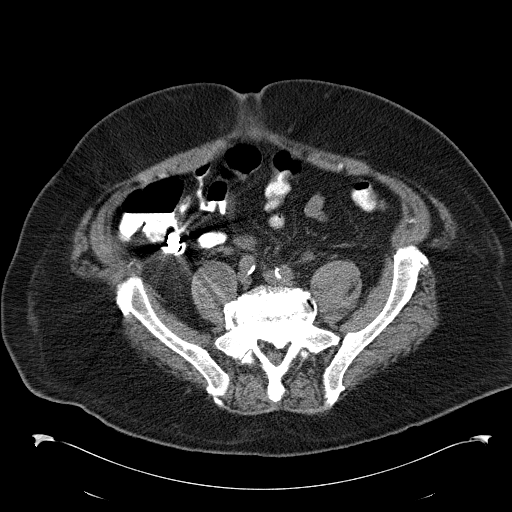
[im 47/107  soft-tissue]
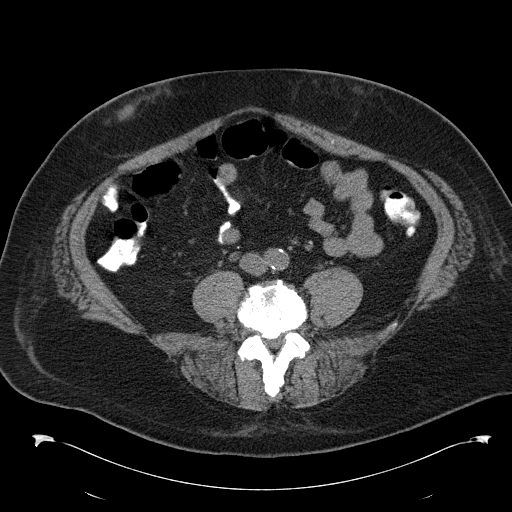
[im 56/107  soft-tissue]
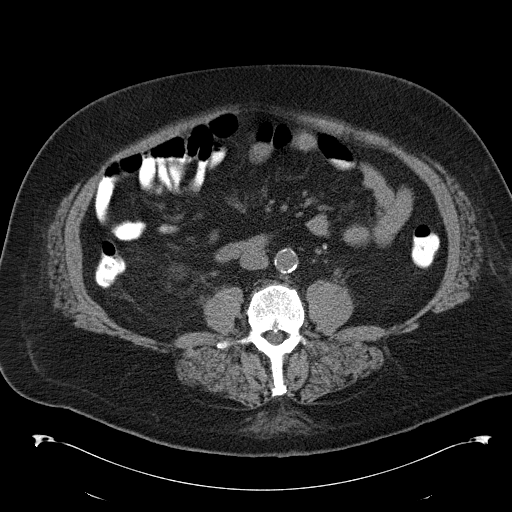
[im 60/107  soft-tissue]
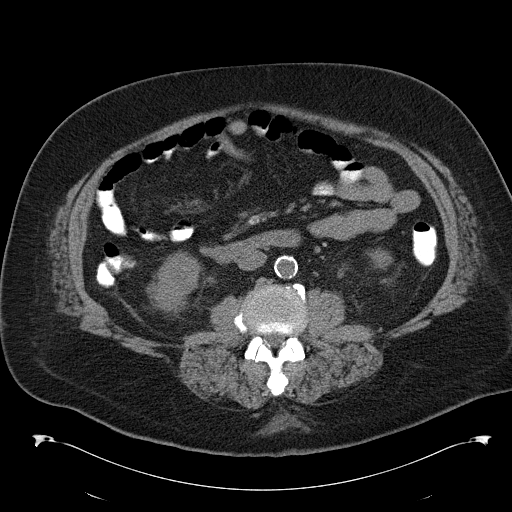
[im 68/107  soft-tissue]
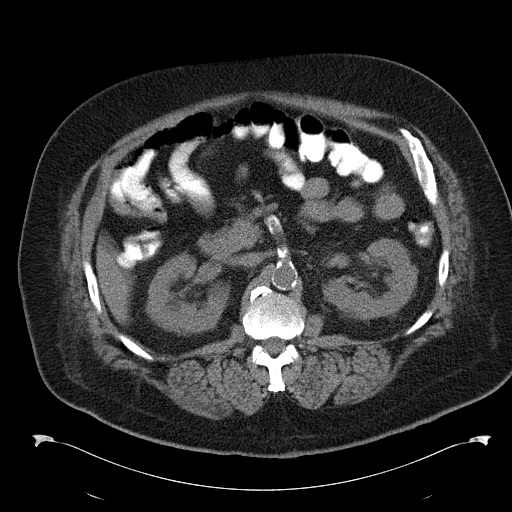
[im 68/107  bone]
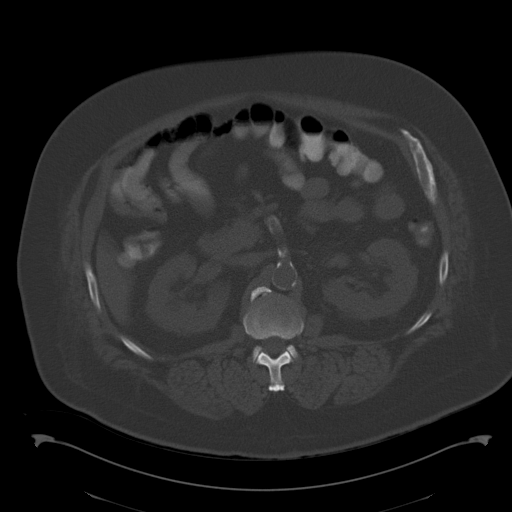
[im 77/107  soft-tissue]
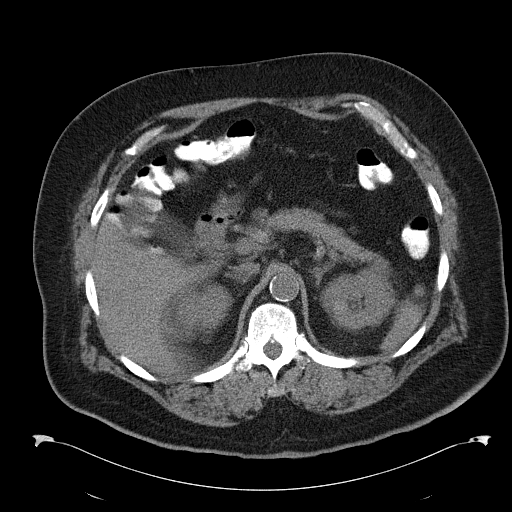
[im 85/107  soft-tissue]
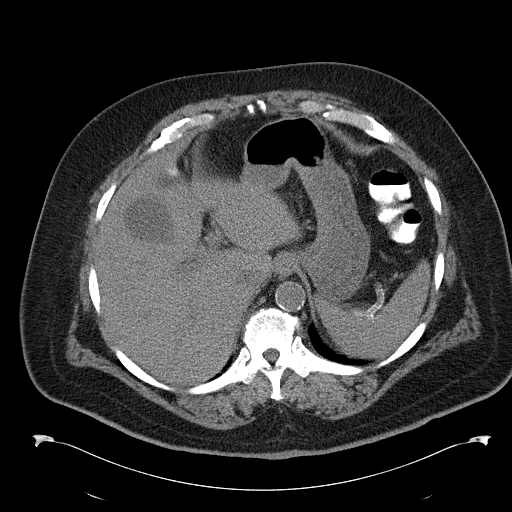
[im 94/107  soft-tissue]
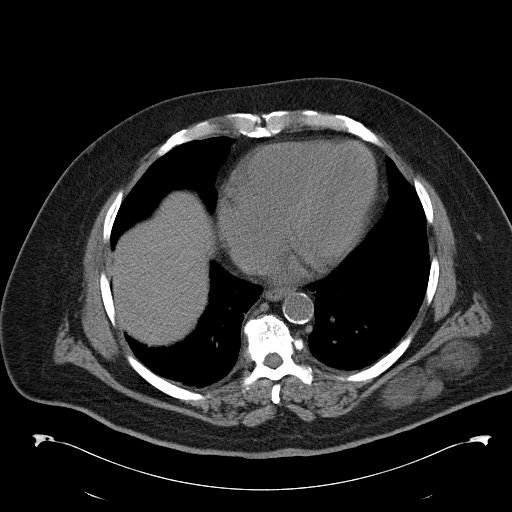
[im 102/107  soft-tissue]
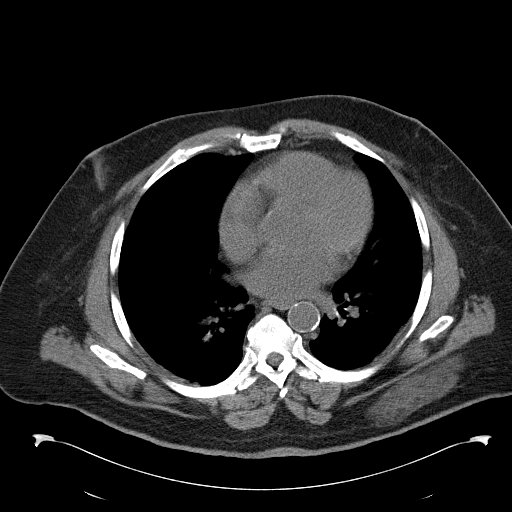

[Series 602: coronal images · coronal · 1.08mm/px · 3 of 111 slices shown]
[im 37/111  soft-tissue]
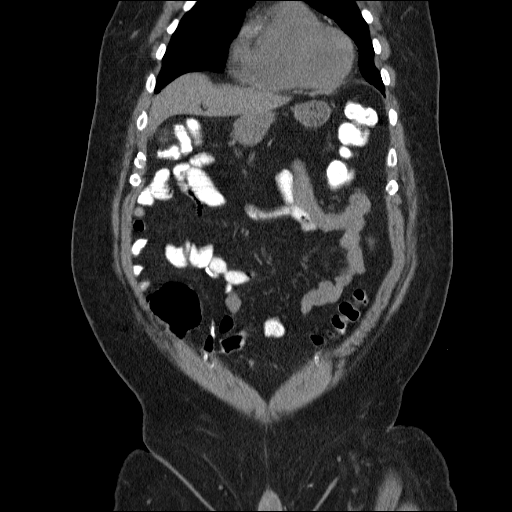
[im 49/111  soft-tissue]
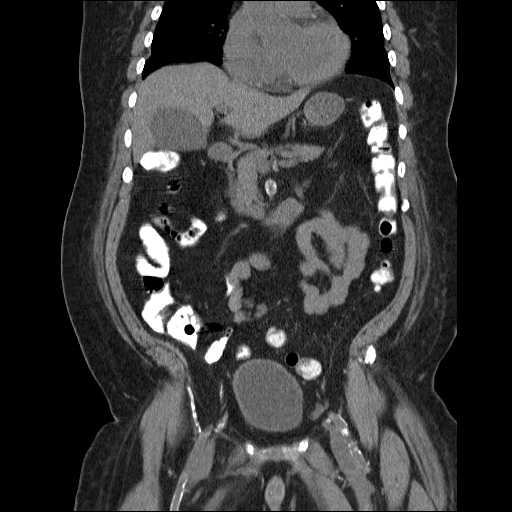
[im 62/111  soft-tissue]
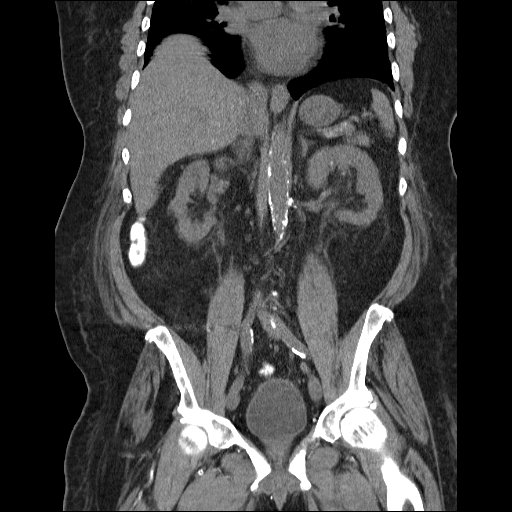

[16 of 46 positions shown; findings below may reference images not displayed]

FINDINGS: There is respiratory motion at the lung bases.  Minimal
atelectasis in the lower lobes.  No consolidation.

Heart size appears within normal limits.

Majority of the contrast is in the distal small bowel and the colon
at the time of the examination.  The stomach is decompressed and
does not contain oral contrast.  No focal abnormality the stomach
is identified on noncontrast imaging.  Small bowel loops are normal
in caliber and wall thickness.  The colon is normal in caliber and
wall thickness.  The rectum has normal wall thickness.  No
intraluminal masses are seen within the bowel loops. Evaluation for
intraluminal masses of the unopacified small bowel loops is
markedly limited.

No appendix is identified.  No evidence of acute appendicitis.
There is mild diverticulosis of the colon.  No evidence of acute
diverticulitis.

There is marked atherosclerotic calcification of the aorta, as well
as the superior mesenteric artery, inferior mesenteric artery,
splenic artery, internal iliac arteries systems, and origin of both
renal arteries, right greater than left.  There is mild scattered
atherosclerotic calcification of the external iliac arteries.
Extensive atherosclerotic calcification of the proximal femoral
arteries bilaterally. No evidence of aneurysm.

The noncontrast appearance of the liver, gallbladder, spleen,
adrenal glands, pancreas, and kidneys is within normal limits.  The
ureters are normal in caliber.  The urinary bladder, prostate
gland, and seminal vesicles appear within normal limits.

Negative for retroperitoneal or mesenteric lymphadenopathy.

Stranding in the anterior abdominal wall bilaterally likely
reflects injection sites.

Negative for free fluid or abscess.

Prominent posterior osseous spurring at L5-S1 causes spinal
stenosis, particularly on the left, where there is left neural
foraminal narrowing.  Vertebral bodies are normal in height and
alignment.  No suspicious bony abnormality.
IMPRESSION: 1. No mass, obstruction, or acute inflammatory changes of the
gastrointestinal tract identified on noncontrast CT
2.  Mild colonic diverticulosis.
3.  Extensive atherosclerosis as described above.
4.  Marked degenerative changes at L5-S1 with spinal stenosis and
narrowing of the left neural foramen.

## 2011-01-12 NOTE — Consult Note (Signed)
Micheal Walls, SUSMAN NO.:  [id number]  MEDICAL RECORD NO.:  [id number]  LOCATION:  52                         FACILITY:  [facility]  PHYSICIAN:  Ronald Lobo, M.D.   DATE OF BIRTH:  Jan 11, 1936  DATE OF CONSULTATION: DATE OF DISCHARGE:                                CONSULTATION   REFERRING PHYSICIAN:  Reyne Dumas, MD  Dr. [name] of the [facility] hospitalist asked [place] to see this 75 year old patient because of GI bleeding and profound anemia.  The patient is on aspirin and Coumadin, but has no prior history of GI bleeding.  He began having dark stools about a week ago and began feeling weak several days ago.  He never had any dyspeptic symptoms.  He came to the emergency room and was found to be profoundly anemic, with a hemoglobin of 3.8, MCV 85.  On top of that, his BUN was markedly elevated at 71 and his INR was out at 6.1.  In the emergency room, he received FFP 2 units, vitamin K 5 mg, and he is now on his second unit of blood.  He is hemodynamically stable.  His main symptom, other than weakness, was shortness of breath.  At this time, he does not appear to be short of breath nor is he having any pain.  PAST MEDICAL HISTORY:  No known allergies.  OUTPATIENT MEDICATIONS: 1. Aspirin 81 mg twice a day. 2. Coumadin. 3. Humulin insulin. 4. Diltiazem. 5. Ramipril. 6. Allopurinol. 7. Crestor. 8. Lasix.  OPERATIONS:  Right BKA and appendectomy.  CHRONIC MEDICAL ILLNESSES: 1. Diabetes of approximately 30 years' duration. 2. Hypertension. 3. History of CVA which is why he is on the Coumadin. 4. Gout. 5. Dyslipidemia. 6. No known cardiopulmonary disease.  HABITS:  Nonsmoker, nondrinker.  FAMILY HISTORY:  Negative for GI illnesses.  SOCIAL HISTORY:  The patient is married, but his wife lives up in [place].  A cousin lives with him.  He is retired from the [employer].  REVIEW OF SYSTEMS:  Note that the patient had a colonoscopy earlier  this year through our office and I believe it was negative.  He has good appetite and no active upper tract symptoms.  PHYSICAL EXAMINATION:  GENERAL:  Pertinent for strongly heme-positive stool.  A very pleasant, substantially overweight African American male, in no evident distress. HEENT:  He is anicteric. CHEST:  Clear. HEART:  Normal, without murmurs or arrhythmias. ABDOMEN:  Obese, but without guarding, mass effect, or tenderness. RECTAL:  Heme-positive stool in the emergency room by the admitting physician or the ER physician.  LABORATORY DATA:  White count 8900, hemoglobin 3.8, MCV 85, RDW elevated at 18, platelets 197,000.  Differential count normal.  Prothrombin time is 55 seconds with an INR of 6.1.  BUN 71, creatinine 2.0, glucose 254. Electrolytes normal.  Liver chemistries normal.  Albumin 2.8. Urinalysis clear.  IMPRESSION:  Subacute upper gastrointestinal bleed, characterized by melenic stools and rise in BUN, in a person on aspirin and Coumadin. The presumption is that he has an aspirin-induced ulcer or gastropathy, with bleeding magnified by over anticoagulation.  PLAN:  Endoscopic evaluation tomorrow after he has had further transfusion.  The hope is  that he will be medically stronger to undergo the procedure tomorrow.  The nature, purpose, and risks of the procedure were reviewed with the patient and his family and he is agreeable to proceed.          ______________________________ Ronald Lobo, M.D.     RB/MEDQ  D:  01/08/2011  T:  01/08/2011  Job:  [id number]  cc:   Theda Belfast. Baird Cancer, M.D. Fax: [phone]  Electronically Signed by Ronald Lobo M.D. on 01/12/2011 03:04:42 PM

## 2011-01-12 NOTE — Op Note (Signed)
Micheal Walls, Micheal Walls             ACCOUNT NO.:  [id number]  MEDICAL RECORD NO.:  [id number]  LOCATION:  4496                         FACILITY:  [facility]  PHYSICIAN:  Ronald Lobo, M.D.   DATE OF BIRTH:  [birth year]  DATE OF PROCEDURE:  01/10/2011 DATE OF DISCHARGE:                              OPERATIVE REPORT   PROCEDURE PERFORMED:  Colonoscopy with polypectomy.  INDICATIONS:  [age]-year-old gentleman, who was on aspirin and Coumadin and came into the hospital with melenic stool, weakness, and a hemoglobin of 3.8.  He underwent endoscopy, which was negative.  This is being done to look for an alternative source of bleeding, particularly in the proximal lower GI tract.  FINDINGS:  No blood or definite source of bleeding seen.  Diverticulosis and small polyps present.  DESCRIPTION OF PROCEDURE:  The nature, purpose and risks of the procedure had been discussed with the patient, who provided written consent.  The procedure was done at the bedside in the step-down unit. Sedation was fentanyl 25 mcg and Versed 5 mg IV without arrhythmias, desaturation or clinical instability during the course of the procedure. Digital exam of the prostate was normal.  The Pentax adult video colonoscope was advanced without significant difficulty around the colon to the area just above the cecum, using a little bit of external abdominal compression to get the tip of the scope to enter the base of the cecum, after which pullback was performed.  The terminal ileum was entered for short distance and appeared completely normal.  The quality of the prep was quite good and it is not felt that any significant lesions would have been missed, although small or focal lesions might have escaped detection due to the presence of small puddles of liquid stool.  There were two small cysts sessile or semipedunculated polyps identified and removed on this exam using cold snare technique, the first in  the proximal ascending colon, the second at about 20 cm from the external anal opening.  There was minimal blood loss with each snare.  The polyps were successfully retrieved for histologic analysis.  The patient had at least one diverticulum in the ascending colon and mild-to-moderate left-sided diverticulosis starting in the region of the splenic flexure.  Note that there was no blood whatsoever in the colonic lumen at the start of this exam nor was any prospective bleeding site identified. Retroflexion in the rectum was normal..  There was just a small amount of rectum obscured by liquid stool.  The patient tolerated the procedure well and there no apparent complications.  IMPRESSION: 1. No bleeding or blood in the colon at the time of this exam. 2. Two small polyps removed by cold snare technique. 3. Rare right-sided diverticulum and moderate left-sided     diverticulosis. 4. No definite source of the patient's recent gastrointestinal bleed     identified on this exam, although it is conceivable that it could     have been a diverticular hemorrhage, especially from the proximal     colon.  PLAN:  Await pathology on the polyps and proceed with capsule endoscopy in the morning to exclude a small bowel source of bleeding.  ______________________________ Ronald Lobo, M.D.     RB/MEDQ  D:  01/10/2011  T:  01/10/2011  Job:  [id number]  cc:   Theda Belfast. Baird Cancer, M.D. Fax: [phone]  Electronically Signed by Ronald Lobo M.D. on 01/12/2011 03:04:51 PM

## 2011-01-12 NOTE — Op Note (Signed)
  [name], Micheal Walls             ACCOUNT NO.:  [id number]  MEDICAL RECORD NO.:  [id number]  LOCATION:  1740                         FACILITY:  [facility]  PHYSICIAN:  Ronald Lobo, M.D.   DATE OF BIRTH:  Aug 24, 1935  DATE OF PROCEDURE:  01/09/2011 DATE OF DISCHARGE:                              OPERATIVE REPORT   PROCEDURE:  Upper endoscopy.  INDICATIONS:  A 75 year old gentleman on aspirin and Coumadin who presented to the hospital yesterday with a several-day history of dark stools and progressive shortness of breath and weakness and was strongly Hemoccult positive with a hemoglobin of 3.8 and an elevated BUN.  FINDINGS:  Normal exam.  PROCEDURE:  The nature, purpose, and risks of the procedure have been reviewed with the patient who provided written consent.  The procedure was done at the bedside in the step-down unit.  Sedation was Versed 6 mg IV (no fentanyl) with no problems with sleep apnea, desaturation, or clinical instability.  The Pentax adult video endoscope was passed under direct vision.  The vocal cords and larynx looked normal.  The esophagus was easily entered and was normal and its entirety.  Specifically, no Mallory-Weiss tear, reflux esophagitis, neoplasia, infection, varices, ring stricture or hiatal hernia were appreciated.  The stomach was entered.  It contained a small bilious residual, with absolutely no coffee-ground material or blood present.  The stomach was remarkably normal despite the patient's history of aspirin exposure.  There were a few dots of punctate erythema in the fundic region, not felt to be pathologic.  I did not see any gastritis, erosions, ulcers, polyps, or masses; and there was no evidence of pathology on retroflex viewing, including the absence of gastric varices or hiatal hernia.  The pylorus, duodenal bulb, and second duodenum looked normal.  The normal findings were somewhat of a surprise, so I reinspected all areas,  again encountering no pathology or source of bleeding.  The scope was then removed from the patient.  He tolerated the procedure well and without apparent complication.  IMPRESSION:  Normal upper endoscopy, without source of significant blood loss identified.  DISCUSSION:  The differential diagnosis of GI bleeding with a negative endoscopy would include a missed lesion, an evanescent lesion such as a Dieulafoy ulcer which has subsequently resolved, hemosuccus pancreaticus, hemobilia, bleeding from the small bowel such as vascular ectasia or diverticular change, or a proximal colonic source of bleeding, keeping in mind that the patient had a negative colonoscopy through our office earlier this year.  PLAN:  We will proceed for colonoscopic evaluation tomorrow to confirm the absence of proximal colonic pathology, such as a cecal ulceration from his aspirin.  If that is negative, I would then consider a small bowel capsule endoscopy and/or an abdominal CT scan.          ______________________________ Ronald Lobo, M.D.     RB/MEDQ  D:  01/09/2011  T:  01/09/2011  Job:  [id number]  cc:   Theda Belfast. Baird Cancer, M.D. Fax: [phone]  Electronically Signed by Ronald Lobo M.D. on 01/12/2011 03:04:46 PM

## 2011-01-13 LAB — CBC
HCT: 25.2 % — ABNORMAL LOW (ref 39.0–52.0)
Hemoglobin: 8.1 g/dL — ABNORMAL LOW (ref 13.0–17.0)
MCH: 26.6 pg (ref 26.0–34.0)
MCHC: 32.1 g/dL (ref 30.0–36.0)
MCV: 82.9 fL (ref 78.0–100.0)
Platelets: 184 10*3/uL (ref 150–400)
RBC: 3.04 MIL/uL — ABNORMAL LOW (ref 4.22–5.81)
RDW: 15.6 % — ABNORMAL HIGH (ref 11.5–15.5)
WBC: 10.4 10*3/uL (ref 4.0–10.5)

## 2011-01-13 LAB — CROSSMATCH
ABO/RH(D): B POS
Antibody Screen: NEGATIVE
Unit division: 0
Unit division: 0

## 2011-01-13 LAB — BASIC METABOLIC PANEL
BUN: 30 mg/dL — ABNORMAL HIGH (ref 6–23)
CO2: 25 mEq/L (ref 19–32)
Calcium: 8.5 mg/dL (ref 8.4–10.5)
Chloride: 105 mEq/L (ref 96–112)
Creatinine, Ser: 1.79 mg/dL — ABNORMAL HIGH (ref 0.50–1.35)
GFR calc Af Amer: 45 mL/min — ABNORMAL LOW (ref >60)
GFR calc non Af Amer: 37 mL/min — ABNORMAL LOW (ref >60)
Glucose, Bld: 138 mg/dL — ABNORMAL HIGH (ref 70–99)
Potassium: 4 mEq/L (ref 3.5–5.1)
Sodium: 137 mEq/L (ref 135–145)

## 2011-01-13 LAB — GLUCOSE, CAPILLARY
Glucose-Capillary: 135 mg/dL — ABNORMAL HIGH (ref 70–99)
Glucose-Capillary: 182 mg/dL — ABNORMAL HIGH (ref 70–99)
Glucose-Capillary: 171 mg/dL — ABNORMAL HIGH (ref 70–99)
Glucose-Capillary: 157 mg/dL — ABNORMAL HIGH (ref 70–99)

## 2011-01-14 LAB — GLUCOSE, CAPILLARY
Glucose-Capillary: 140 mg/dL — ABNORMAL HIGH (ref 70–99)
Glucose-Capillary: 198 mg/dL — ABNORMAL HIGH (ref 70–99)

## 2011-01-14 LAB — BASIC METABOLIC PANEL
BUN: 36 mg/dL — ABNORMAL HIGH (ref 6–23)
CO2: 23 mEq/L (ref 19–32)
Calcium: 8.4 mg/dL (ref 8.4–10.5)
Chloride: 106 mEq/L (ref 96–112)
Creatinine, Ser: 1.83 mg/dL — ABNORMAL HIGH (ref 0.50–1.35)
GFR calc Af Amer: 44 mL/min — ABNORMAL LOW (ref >60)
GFR calc non Af Amer: 36 mL/min — ABNORMAL LOW (ref >60)
Glucose, Bld: 139 mg/dL — ABNORMAL HIGH (ref 70–99)
Potassium: 4.1 mEq/L (ref 3.5–5.1)
Sodium: 138 mEq/L (ref 135–145)

## 2011-01-14 LAB — CBC
HCT: 24.7 % — ABNORMAL LOW (ref 39.0–52.0)
Hemoglobin: 7.8 g/dL — ABNORMAL LOW (ref 13.0–17.0)
MCH: 26.4 pg (ref 26.0–34.0)
MCHC: 31.6 g/dL (ref 30.0–36.0)
MCV: 83.4 fL (ref 78.0–100.0)
Platelets: 196 10*3/uL (ref 150–400)
RBC: 2.96 MIL/uL — ABNORMAL LOW (ref 4.22–5.81)
RDW: 15.6 % — ABNORMAL HIGH (ref 11.5–15.5)
WBC: 10.3 10*3/uL (ref 4.0–10.5)

## 2011-01-25 NOTE — Discharge Summary (Signed)
Micheal Walls, Micheal Walls             ACCOUNT NO.:  [id number]  MEDICAL RECORD NO.:  [id number]  LOCATION:  8242                         FACILITY:  [facility]  PHYSICIAN:  Kathie Dike, MD     DATE OF BIRTH:  Jan 03, 1936  DATE OF ADMISSION:  01/08/2011 DATE OF DISCHARGE:  01/14/2011                              DISCHARGE SUMMARY   PRIMARY CARE PHYSICIAN:  Micheal N. Baird Cancer M.D.  CARDIOLOGIST:  Micheal Klein MD from [facility].  NEPHROLOGIST:  Micheal Walls, M.D.  DISCHARGE DIAGNOSES: 1. Acute blood loss anemia, stable. 2. Dyspnea secondary to acute blood loss anemia, stable, improved. 3. Heme-positive stools with negative upper endoscopy, capsule     endoscopy and colonoscopy, possible source though to be transient     lesions such as Dieulafoy's ulcer. 4. Chronic kidney disease, stage III with baseline creatinine of 1.7     to 2. 5. Obstructive sleep apnea, on CPAP. 6. History of CVA in the past. 7. Coagulopathy secondary to Coumadin, reversed. 8. Hypertension. 9. Insulin-dependent type 2 diabetes. 10.Acute gout, improved. 11.Dyslipidemia.  DISCHARGE MEDICATIONS: 1. Protonix 40 mg p.o. daily. 2. Colchicine 0.6 mg p.o. b.i.d. 3. Allopurinol 100 mg p.o. daily. 4. Diltiazem CD XT 300 mg 1 capsule p.o. daily. 5. Crestor 10 mg p.o. daily. 6. Niaspan 500 mg 2 tablets p.o. q.h.s. 7. Humulin 70/30, 35 units subcutaneous b.i.d. 8. Lasix 40 mg p.o. b.i.d. 9. Enteric-coated aspirin 81 mg p.o. daily. 10.Multivitamins 1 tablet p.o. daily.  Medication stopped in the hospital: 1. Ramipril 2. Coumadin due to bleeding.  ADMISSION HISTORY:  This is a 75 year old African American gentleman who was brought to the hospital with shortness of breath and having dark stools for approximately 10 days prior to admission.  On ER evaluation, he was noted to have hemoglobin of 4.3, from baseline of 12.7 and in March 2012 he was asked to the ER for evaluation.  The  patient was on Coumadin due to his history of CVA, although he does not report any history of atrial fibrillation in the past.  In the ER he was evaluated and found to have an INR of 6 and hemoglobin of 3.8 and was subsequently admitted for further treatment.  For details, please refer to the history and physical per Dr. [name] on July 28.  HOSPITAL COURSE: 1. Acute blood loss anemia:  The patient's stool was positive for     occult blood.  He was seen in consultation by Dr. [name] from the     gastroenterology service.  He underwent transfusion of PRBCs     requiring a total of 5 units of PRBCs during his hospitalization.     He also received vitamin K to reverse his INR.  He underwent upper     endoscopy which was essentially normal without source of     significant blood loss identified.  Subsequently a colonoscopy was     done which showed no bleeding or blood in the colon at the time of     the exam.  Subsequently a capsule endoscopy was done which was     unremarkable as well.  The patient did not have any recurrence of  melena.  His hemoglobin has remained stable, most recent value is     7.8.  The patient is asymptomatic at this time.  Regarding the     source per Dr. [name], the patient may have had a transient lesion     such as a Dieulafoy's ulcer.  Due to lack of significant findings     on endoscopic evaluation, it was difficult to do a risk/benefit     assessment for resumption of Coumadin.  At this time, we will     restart aspirin for stroke prophylaxis with proton pump inhibitor     indefinitely.  The patient does not have a history of atrial     fibrillation and is currently in sinus rhythm.  This can of course     be reevaluated by the patient's primary care     physician/cardiologist, but again at this time due to his very     significant anemia and lack of cardiac arrhythmias, we will not     restart his Coumadin at this time.  The patient does understand his      risk for stroke and is agreeable. 2. Acute on chronic kidney disease:  The patient's hemoglobin ranges     from 1.7 to 2.  He is followed by Dr. Moshe Cipro and he is taking     Lasix as an outpatient.  We will resume this.  He did receive some     IV Lasix while here in the hospital.  He did have a significant     prerenal azotemia which was thought to be due to significant blood     loss that has since improved.  He may follow up with Dr.     Moshe Cipro as previously scheduled. 3. Diabetes:  This is controlled on his current diabetic regimen.  His     A1c was in good range. 4. The patient is appropriate for discharge at this time.  He is     asymptomatic and can follow up with his primary care for further     management.  CONSULTATIONS:  Gastroenterology, Dr. [name].  DIAGNOSTIC IMAGING:  CT scan of the abdomen and pelvis on August 1st, shows no mass obstruction or acute inflammatory change of the gastrointestinal track identified on noncontrast CT.  Colonic diverticulosis, extensive atherosclerosis as described above.  Marked degenerative changes at L5-S1.  There is mild stenosis and narrowing of the left neural foramen.  Chest x-ray on July 29th shows negative for acute pneumonia or CHF.  2-D echocardiogram done on July 29th, shows ejection fraction of 65%.  No regional wall motion abnormalities.  Left atriums was mildly dilated.  PROCEDURES: 1. Upper endoscopy done by Dr. [name] on July 29th, shows normal     upper endoscopy without source of significant blood loss     identified. 2. Colonoscopy on July 30th, shows no bleeding or blood in the colon     at the time of this exam.  2 small polyps removed by cold snare     technique.  Rare right-sided diverticulum and moderate left-sided     diverticulosis.  No definite source of the patient's recent     gastrointestinal bleed identified on this exam, although it is     conceivable that it could be diverticular hemorrhage  especially     from the proximal colon. 3. Capsule endoscopy on August 1st is unremarkable for small bowel     video capsule endoscopy without source of recent major  gastrointestinal bleed event.  DISCHARGE INSTRUCTIONS:  The patient will need to follow up with his primary care physician in the next 1 week and have a repeat CBC and basic metabolic panel drawn at that time.  He will also need his stools recheck for occult blood.  He may follow with Dr. [name] as needed and can follow up with Dr. Moshe Cipro and Dr. Sallyanne Kuster as previously scheduled.  He should continue on a heart-healthy low-calorie diet, conduct his activity as tolerated.  Condition at the time of discharge is  improved.     Kathie Dike, MD     JM/MEDQ  D:  01/14/2011  T:  01/15/2011  Job:  [id number]  cc:   Micheal Walls, M.D. Fax: [phone]  Theda Belfast. Baird Cancer, M.D. Fax: [phone]  Micheal Klein, MD Fax: [phone]  Electronically Signed by Kathie Dike  on 01/25/2011 02:11:54 PM

## 2011-01-25 NOTE — Op Note (Signed)
  NAMEJARICK, HARKINS             ACCOUNT NO.:  [id number]  MEDICAL RECORD NO.:  [id number]  LOCATION:  2035                         FACILITY:  [facility]  PHYSICIAN:  Ronald Lobo, M.D.   DATE OF BIRTH:  Apr 02, 1936  DATE OF PROCEDURE: DATE OF DISCHARGE:                              OPERATIVE REPORT   PROCEDURE:  Video small bowel capsule endoscopy.  INDICATIONS:  This gentleman presented with a GI bleed characterized by melenic stool and a hemoglobin of 3.8 several days ago.  Endoscopy at that time was negative.  Despite the fact, he had an elevated BUN suggesting an upper tract source.  Colonoscopy showed some scattered diverticulosis.  This is being done to exclude a small bowel source of the GI bleed.  FINDINGS:  Essentially normal exam.  PROCEDURE IN DETAIL:  The capsule was followed orally as an inpatient at [place] time to the duodenum was about 10 minutes, and to the cecum, transit time was about 4 hours.  This was essentially normal examination, with no blood present in the small intestinal lumen or in the observed portions of the colon during this several hours that the capsule to well on 12th in the colon prior to the completion of the study.  No small bowel diverticula, masses, polyps, inflammation, vascular ectasia, or other highly suspicious lesions were seen.  There were several areas of focal mucosal abnormality, characterized either by vascular prominence or the hint of the focal erosion with perhaps even a somewhat hemorrhagic base, but nothing that really looked pathologic or is thought to explain the patient's recent severe GI bleed.  IMPRESSION:  Unremarkable small bowel video capsule endoscopy, without source of recent major gastrointestinal bleed evident.  RECOMMENDATIONS:  The patient has now had a basically unrevealing endoscopy, colonoscopy, abdominal and pelvic CT, and small bowel capsule endoscopy.  My suspicion is that  the patient had an evanescent lesion such as a Dieulafoy ulcer to account for his bleeding, but we will probably never know for sure.  I do not think that any further workup is indicated at this time, as long as he remains free of further bleeding.          ______________________________ Ronald Lobo, M.D.     RB/MEDQ  D:  01/12/2011  T:  01/13/2011  Job:  [id number]  cc:   Theda Belfast. Baird Cancer, M.D. Fax: [phone]  Electronically Signed by Ronald Lobo M.D. on 01/25/2011 10:57:47 AM

## 2011-02-01 NOTE — H&P (Signed)
Micheal Walls, Micheal Walls             ACCOUNT NO.:  [id number]  MEDICAL RECORD NO.:  [id number]  LOCATION:  WLED                         FACILITY:  [facility]  PHYSICIAN:  Reyne Dumas, MD       DATE OF BIRTH:  01-Dec-1935  DATE OF ADMISSION:  01/08/2011 DATE OF DISCHARGE:                             HISTORY & PHYSICAL   PRIMARY CARE PHYSICIAN:  [name] Cancer, M.D.  CHIEF COMPLAINT:  Shortness of breath.  SUBJECTIVE:  This is a 75 year old male with a history of diabetes, who presents to the ER with a chief complaint of weakness for about a week, dizziness for about a week and shortness of breath for about one and half weeks.  He has also noticed grossly melanotic stools for about 10 days.  The patient was told that he had a hemoglobin of 4.3 from a baseline of 12.7 on August 23, 2010 and he was asked to come to the ER for further evaluation.  He also had a negative colonoscopy in February of this year.  The patient is on Coumadin because of a history of CVA. I am unaware if he has a history of atrial fibrillation or not.  The patient also denies any history of atrial fibrillation.  He denies any chest pain, but does complain of dyspnea on exertion.  He has also noticed left lower extremity edema.  He denies any recent weight loss. He denies any epigastric pain.  He denies any hematemesis.  Denies any orthopnea, paroxysmal nocturnal dyspnea.  He was found to have a hemoglobin of 3.8 and an INR of 6.0 in the ER.  PAST MEDICAL HISTORY: 1. History of diabetes. 2. Hypertension. 3. Cerebrovascular accident. 4. Gout. 5. Dyslipidemia.  PAST SURGICAL HISTORY: 1. Right below-knee amputation. 2. Appendectomy.  FAMILY HISTORY:  Father died of heart attack at the age of 3.  Mother died of diabetes mellitus.  SOCIAL HISTORY:  Denies any history of smoking or alcohol use.  Denies any history of drug use and currently retired.  ALLERGIES:  No known drug allergies.  HOME  MEDICATIONS: 1. Multivitamin. 2. Aspirin. 3. Lasix. 4. Humulin. 5. Niaspan. 6. Crestor. 7. Coumadin. 8. Ramipril. 9. Diltiazem. 10.Allopurinol.  PHYSICAL EXAMINATION:  VITAL SIGNS:  Blood pressure 122/54, pulse of 99, respirations 20, temperature 97.8. GENERAL:  Comfortable, in no acute cardiopulmonary distress. HEENT:  Pupils equal and reactive.  Extraocular movements intact. NECK:  Supple.  No JVD. LUNGS:  Clear to auscultation bilaterally.  No wheezes or crackles or rhonchi. CARDIOVASCULAR:  Regular rate and rhythm.  No murmurs, rubs or gallops. ABDOMEN:  Obese, soft, nontender, nondistended. EXTREMITIES:  Without cyanosis, clubbing or edema. NEUROLOGIC:  Cranial nerves II through XII grossly intact. PSYCHIATRIC:  Appropriate mood and affect.  LABORATORY DATA:  CBC:  WBC 8.9, hemoglobin 3.8, hematocrit 12.3 and platelet count of 197,000.  INR of 6.09.  Urinalysis negative. Comprehensive metabolic panel shows sodium of 137, potassium 4.1, chloride 107, bicarb 23, glucose 254, BUN 71, creatinine 2.1, total bilirubin of 0.2, AST 20, ALT 15, total protein of 6.3, calcium 9.3.  ASSESSMENT AND PLAN: 1. Shortness of breath, multifactorial, could be secondary to anemia     versus congestive  heart failure exacerbation secondary to anemia. 2. Chronic kidney disease, stage III baseline creatinine of 2, on     anticoagulation with supratherapeutic INR. 3. History of cerebrovascular accident.  PLAN:  The patient will be admitted to step-down unit.  We will transfuse him with 2 units, if the patient's hemoglobin remains low then the patient will be transfused with a third unit.  He has already received 2 units of fresh frozen plasma and vitamin K in the ER.  The patient received 10 units of IV vitamin K, 40 of IV Protonix in the ED. He is being transfused with first unit of packed red blood cells.  We will monitor his CBC q.8h. and transfuse him with a hemoglobin greater than 6 to  be able to stabilize him enough for the endoscopy Dr. [name] has been consulted and the patient will receive Carafate and will place him on the Protonix drip.  We will also give him some Lasix to help him with his CHF symptoms, which are most likely secondary to his underlying anemia.  We will obtain a 2-D echo, thyroid function tests.  He is a full code.     Reyne Dumas, MD     NA/MEDQ  D:  01/08/2011  T:  01/08/2011  Job:  [id number]  Electronically Signed by Reyne Dumas MD on 02/01/2011 10:45:17 PM

## 2011-07-28 DIAGNOSIS — E11339 Type 2 diabetes mellitus with moderate nonproliferative diabetic retinopathy without macular edema: Secondary | ICD-10-CM | POA: Diagnosis not present

## 2011-07-28 DIAGNOSIS — H35049 Retinal micro-aneurysms, unspecified, unspecified eye: Secondary | ICD-10-CM | POA: Diagnosis not present

## 2011-07-28 DIAGNOSIS — H31009 Unspecified chorioretinal scars, unspecified eye: Secondary | ICD-10-CM | POA: Diagnosis not present

## 2011-07-28 DIAGNOSIS — H4011X0 Primary open-angle glaucoma, stage unspecified: Secondary | ICD-10-CM | POA: Diagnosis not present

## 2011-08-01 DIAGNOSIS — B351 Tinea unguium: Secondary | ICD-10-CM | POA: Diagnosis not present

## 2011-08-01 DIAGNOSIS — M79609 Pain in unspecified limb: Secondary | ICD-10-CM | POA: Diagnosis not present

## 2011-08-26 DIAGNOSIS — N2581 Secondary hyperparathyroidism of renal origin: Secondary | ICD-10-CM | POA: Diagnosis not present

## 2011-08-26 DIAGNOSIS — N183 Chronic kidney disease, stage 3 unspecified: Secondary | ICD-10-CM | POA: Diagnosis not present

## 2011-08-26 DIAGNOSIS — I129 Hypertensive chronic kidney disease with stage 1 through stage 4 chronic kidney disease, or unspecified chronic kidney disease: Secondary | ICD-10-CM | POA: Diagnosis not present

## 2011-08-26 DIAGNOSIS — E119 Type 2 diabetes mellitus without complications: Secondary | ICD-10-CM | POA: Diagnosis not present

## 2011-08-26 DIAGNOSIS — D649 Anemia, unspecified: Secondary | ICD-10-CM | POA: Diagnosis not present

## 2011-09-20 DIAGNOSIS — E1129 Type 2 diabetes mellitus with other diabetic kidney complication: Secondary | ICD-10-CM | POA: Diagnosis not present

## 2011-09-20 DIAGNOSIS — E119 Type 2 diabetes mellitus without complications: Secondary | ICD-10-CM | POA: Diagnosis not present

## 2011-09-20 DIAGNOSIS — Z79899 Other long term (current) drug therapy: Secondary | ICD-10-CM | POA: Diagnosis not present

## 2011-09-20 DIAGNOSIS — N183 Chronic kidney disease, stage 3 unspecified: Secondary | ICD-10-CM | POA: Diagnosis not present

## 2011-11-03 DIAGNOSIS — B351 Tinea unguium: Secondary | ICD-10-CM | POA: Diagnosis not present

## 2011-11-03 DIAGNOSIS — M79609 Pain in unspecified limb: Secondary | ICD-10-CM | POA: Diagnosis not present

## 2012-01-20 DIAGNOSIS — Z79899 Other long term (current) drug therapy: Secondary | ICD-10-CM | POA: Diagnosis not present

## 2012-01-20 DIAGNOSIS — I10 Essential (primary) hypertension: Secondary | ICD-10-CM | POA: Diagnosis not present

## 2012-01-20 DIAGNOSIS — N183 Chronic kidney disease, stage 3 unspecified: Secondary | ICD-10-CM | POA: Diagnosis not present

## 2012-01-20 DIAGNOSIS — E1129 Type 2 diabetes mellitus with other diabetic kidney complication: Secondary | ICD-10-CM | POA: Diagnosis not present

## 2012-01-20 DIAGNOSIS — R0989 Other specified symptoms and signs involving the circulatory and respiratory systems: Secondary | ICD-10-CM | POA: Diagnosis not present

## 2012-01-20 DIAGNOSIS — E119 Type 2 diabetes mellitus without complications: Secondary | ICD-10-CM | POA: Diagnosis not present

## 2012-01-26 DIAGNOSIS — H35049 Retinal micro-aneurysms, unspecified, unspecified eye: Secondary | ICD-10-CM | POA: Diagnosis not present

## 2012-01-26 DIAGNOSIS — E1139 Type 2 diabetes mellitus with other diabetic ophthalmic complication: Secondary | ICD-10-CM | POA: Diagnosis not present

## 2012-01-26 DIAGNOSIS — H31009 Unspecified chorioretinal scars, unspecified eye: Secondary | ICD-10-CM | POA: Diagnosis not present

## 2012-01-26 DIAGNOSIS — E11339 Type 2 diabetes mellitus with moderate nonproliferative diabetic retinopathy without macular edema: Secondary | ICD-10-CM | POA: Diagnosis not present

## 2012-01-26 DIAGNOSIS — H4011X0 Primary open-angle glaucoma, stage unspecified: Secondary | ICD-10-CM | POA: Diagnosis not present

## 2012-02-02 DIAGNOSIS — B351 Tinea unguium: Secondary | ICD-10-CM | POA: Diagnosis not present

## 2012-02-17 DIAGNOSIS — D649 Anemia, unspecified: Secondary | ICD-10-CM | POA: Diagnosis not present

## 2012-02-17 DIAGNOSIS — E1142 Type 2 diabetes mellitus with diabetic polyneuropathy: Secondary | ICD-10-CM | POA: Diagnosis not present

## 2012-02-17 DIAGNOSIS — I129 Hypertensive chronic kidney disease with stage 1 through stage 4 chronic kidney disease, or unspecified chronic kidney disease: Secondary | ICD-10-CM | POA: Diagnosis not present

## 2012-02-17 DIAGNOSIS — N183 Chronic kidney disease, stage 3 unspecified: Secondary | ICD-10-CM | POA: Diagnosis not present

## 2012-03-07 DIAGNOSIS — N183 Chronic kidney disease, stage 3 unspecified: Secondary | ICD-10-CM | POA: Diagnosis not present

## 2012-03-07 DIAGNOSIS — I10 Essential (primary) hypertension: Secondary | ICD-10-CM | POA: Diagnosis not present

## 2012-03-07 DIAGNOSIS — E1129 Type 2 diabetes mellitus with other diabetic kidney complication: Secondary | ICD-10-CM | POA: Diagnosis not present

## 2012-03-07 DIAGNOSIS — Z8249 Family history of ischemic heart disease and other diseases of the circulatory system: Secondary | ICD-10-CM | POA: Diagnosis not present

## 2012-03-15 DIAGNOSIS — I10 Essential (primary) hypertension: Secondary | ICD-10-CM | POA: Diagnosis not present

## 2012-03-15 DIAGNOSIS — I509 Heart failure, unspecified: Secondary | ICD-10-CM | POA: Diagnosis not present

## 2012-03-28 DIAGNOSIS — H04219 Epiphora due to excess lacrimation, unspecified lacrimal gland: Secondary | ICD-10-CM | POA: Diagnosis not present

## 2012-03-28 DIAGNOSIS — H4011X0 Primary open-angle glaucoma, stage unspecified: Secondary | ICD-10-CM | POA: Diagnosis not present

## 2012-03-28 DIAGNOSIS — H409 Unspecified glaucoma: Secondary | ICD-10-CM | POA: Diagnosis not present

## 2012-03-28 DIAGNOSIS — H1045 Other chronic allergic conjunctivitis: Secondary | ICD-10-CM | POA: Diagnosis not present

## 2012-03-28 DIAGNOSIS — Z961 Presence of intraocular lens: Secondary | ICD-10-CM | POA: Diagnosis not present

## 2012-04-12 DIAGNOSIS — I2510 Atherosclerotic heart disease of native coronary artery without angina pectoris: Secondary | ICD-10-CM | POA: Diagnosis not present

## 2012-04-12 DIAGNOSIS — N183 Chronic kidney disease, stage 3 unspecified: Secondary | ICD-10-CM | POA: Diagnosis not present

## 2012-04-12 DIAGNOSIS — E1129 Type 2 diabetes mellitus with other diabetic kidney complication: Secondary | ICD-10-CM | POA: Diagnosis not present

## 2012-04-12 DIAGNOSIS — Z23 Encounter for immunization: Secondary | ICD-10-CM | POA: Diagnosis not present

## 2012-04-12 DIAGNOSIS — I10 Essential (primary) hypertension: Secondary | ICD-10-CM | POA: Diagnosis not present

## 2012-05-03 DIAGNOSIS — M79609 Pain in unspecified limb: Secondary | ICD-10-CM | POA: Diagnosis not present

## 2012-05-03 DIAGNOSIS — B351 Tinea unguium: Secondary | ICD-10-CM | POA: Diagnosis not present

## 2012-06-01 DIAGNOSIS — N183 Chronic kidney disease, stage 3 unspecified: Secondary | ICD-10-CM | POA: Diagnosis not present

## 2012-06-01 DIAGNOSIS — I10 Essential (primary) hypertension: Secondary | ICD-10-CM | POA: Diagnosis not present

## 2012-06-01 DIAGNOSIS — E1129 Type 2 diabetes mellitus with other diabetic kidney complication: Secondary | ICD-10-CM | POA: Diagnosis not present

## 2012-06-01 DIAGNOSIS — Z794 Long term (current) use of insulin: Secondary | ICD-10-CM | POA: Diagnosis not present

## 2012-06-07 DIAGNOSIS — H04129 Dry eye syndrome of unspecified lacrimal gland: Secondary | ICD-10-CM | POA: Diagnosis not present

## 2012-06-07 DIAGNOSIS — H4011X0 Primary open-angle glaucoma, stage unspecified: Secondary | ICD-10-CM | POA: Diagnosis not present

## 2012-06-07 DIAGNOSIS — H409 Unspecified glaucoma: Secondary | ICD-10-CM | POA: Diagnosis not present

## 2012-07-18 DIAGNOSIS — H409 Unspecified glaucoma: Secondary | ICD-10-CM | POA: Diagnosis not present

## 2012-07-18 DIAGNOSIS — H04129 Dry eye syndrome of unspecified lacrimal gland: Secondary | ICD-10-CM | POA: Diagnosis not present

## 2012-07-18 DIAGNOSIS — H4011X0 Primary open-angle glaucoma, stage unspecified: Secondary | ICD-10-CM | POA: Diagnosis not present

## 2012-07-18 DIAGNOSIS — H04209 Unspecified epiphora, unspecified lacrimal gland: Secondary | ICD-10-CM | POA: Diagnosis not present

## 2012-08-06 DIAGNOSIS — B351 Tinea unguium: Secondary | ICD-10-CM | POA: Diagnosis not present

## 2012-08-06 DIAGNOSIS — M79609 Pain in unspecified limb: Secondary | ICD-10-CM | POA: Diagnosis not present

## 2012-08-14 DIAGNOSIS — E1139 Type 2 diabetes mellitus with other diabetic ophthalmic complication: Secondary | ICD-10-CM | POA: Diagnosis not present

## 2012-08-14 DIAGNOSIS — E11339 Type 2 diabetes mellitus with moderate nonproliferative diabetic retinopathy without macular edema: Secondary | ICD-10-CM | POA: Diagnosis not present

## 2012-08-14 DIAGNOSIS — H4011X0 Primary open-angle glaucoma, stage unspecified: Secondary | ICD-10-CM | POA: Diagnosis not present

## 2012-08-14 DIAGNOSIS — E11349 Type 2 diabetes mellitus with severe nonproliferative diabetic retinopathy without macular edema: Secondary | ICD-10-CM | POA: Diagnosis not present

## 2012-10-02 DIAGNOSIS — H04209 Unspecified epiphora, unspecified lacrimal gland: Secondary | ICD-10-CM | POA: Diagnosis not present

## 2012-10-02 DIAGNOSIS — H04569 Stenosis of unspecified lacrimal punctum: Secondary | ICD-10-CM | POA: Insufficient documentation

## 2012-10-15 DIAGNOSIS — H4011X0 Primary open-angle glaucoma, stage unspecified: Secondary | ICD-10-CM | POA: Diagnosis not present

## 2012-10-15 DIAGNOSIS — H04209 Unspecified epiphora, unspecified lacrimal gland: Secondary | ICD-10-CM | POA: Diagnosis not present

## 2012-10-15 DIAGNOSIS — H409 Unspecified glaucoma: Secondary | ICD-10-CM | POA: Diagnosis not present

## 2012-10-15 DIAGNOSIS — H04129 Dry eye syndrome of unspecified lacrimal gland: Secondary | ICD-10-CM | POA: Diagnosis not present

## 2012-10-29 DIAGNOSIS — M79609 Pain in unspecified limb: Secondary | ICD-10-CM | POA: Diagnosis not present

## 2012-10-29 DIAGNOSIS — B351 Tinea unguium: Secondary | ICD-10-CM | POA: Diagnosis not present

## 2012-11-19 DIAGNOSIS — H4011X0 Primary open-angle glaucoma, stage unspecified: Secondary | ICD-10-CM | POA: Diagnosis not present

## 2012-11-19 DIAGNOSIS — H04129 Dry eye syndrome of unspecified lacrimal gland: Secondary | ICD-10-CM | POA: Diagnosis not present

## 2012-11-26 DIAGNOSIS — K219 Gastro-esophageal reflux disease without esophagitis: Secondary | ICD-10-CM | POA: Diagnosis not present

## 2012-11-26 DIAGNOSIS — N183 Chronic kidney disease, stage 3 unspecified: Secondary | ICD-10-CM | POA: Diagnosis not present

## 2012-11-26 DIAGNOSIS — Z0000 Encounter for general adult medical examination without abnormal findings: Secondary | ICD-10-CM | POA: Diagnosis not present

## 2012-11-26 DIAGNOSIS — E1129 Type 2 diabetes mellitus with other diabetic kidney complication: Secondary | ICD-10-CM | POA: Diagnosis not present

## 2012-12-03 DIAGNOSIS — R339 Retention of urine, unspecified: Secondary | ICD-10-CM | POA: Diagnosis not present

## 2012-12-03 DIAGNOSIS — I10 Essential (primary) hypertension: Secondary | ICD-10-CM | POA: Diagnosis not present

## 2012-12-03 DIAGNOSIS — E119 Type 2 diabetes mellitus without complications: Secondary | ICD-10-CM | POA: Diagnosis not present

## 2012-12-03 DIAGNOSIS — R062 Wheezing: Secondary | ICD-10-CM | POA: Diagnosis not present

## 2012-12-17 DIAGNOSIS — E1129 Type 2 diabetes mellitus with other diabetic kidney complication: Secondary | ICD-10-CM | POA: Diagnosis not present

## 2012-12-17 DIAGNOSIS — I10 Essential (primary) hypertension: Secondary | ICD-10-CM | POA: Diagnosis not present

## 2012-12-17 DIAGNOSIS — R339 Retention of urine, unspecified: Secondary | ICD-10-CM | POA: Diagnosis not present

## 2012-12-17 DIAGNOSIS — N183 Chronic kidney disease, stage 3 unspecified: Secondary | ICD-10-CM | POA: Diagnosis not present

## 2013-01-21 DIAGNOSIS — H409 Unspecified glaucoma: Secondary | ICD-10-CM | POA: Diagnosis not present

## 2013-01-21 DIAGNOSIS — H04129 Dry eye syndrome of unspecified lacrimal gland: Secondary | ICD-10-CM | POA: Diagnosis not present

## 2013-01-21 DIAGNOSIS — H4011X0 Primary open-angle glaucoma, stage unspecified: Secondary | ICD-10-CM | POA: Diagnosis not present

## 2013-01-21 DIAGNOSIS — H04229 Epiphora due to insufficient drainage, unspecified lacrimal gland: Secondary | ICD-10-CM | POA: Diagnosis not present

## 2013-01-28 DIAGNOSIS — B351 Tinea unguium: Secondary | ICD-10-CM | POA: Diagnosis not present

## 2013-01-28 DIAGNOSIS — M79609 Pain in unspecified limb: Secondary | ICD-10-CM | POA: Diagnosis not present

## 2013-02-04 DIAGNOSIS — I2510 Atherosclerotic heart disease of native coronary artery without angina pectoris: Secondary | ICD-10-CM | POA: Diagnosis not present

## 2013-02-04 DIAGNOSIS — E1129 Type 2 diabetes mellitus with other diabetic kidney complication: Secondary | ICD-10-CM | POA: Diagnosis not present

## 2013-02-04 DIAGNOSIS — R945 Abnormal results of liver function studies: Secondary | ICD-10-CM | POA: Diagnosis not present

## 2013-02-04 DIAGNOSIS — I10 Essential (primary) hypertension: Secondary | ICD-10-CM | POA: Diagnosis not present

## 2013-02-04 DIAGNOSIS — Z0000 Encounter for general adult medical examination without abnormal findings: Secondary | ICD-10-CM | POA: Diagnosis not present

## 2013-02-04 DIAGNOSIS — E7800 Pure hypercholesterolemia, unspecified: Secondary | ICD-10-CM | POA: Diagnosis not present

## 2013-02-04 DIAGNOSIS — K219 Gastro-esophageal reflux disease without esophagitis: Secondary | ICD-10-CM | POA: Diagnosis not present

## 2013-02-04 DIAGNOSIS — N183 Chronic kidney disease, stage 3 unspecified: Secondary | ICD-10-CM | POA: Diagnosis not present

## 2013-02-04 DIAGNOSIS — E119 Type 2 diabetes mellitus without complications: Secondary | ICD-10-CM | POA: Diagnosis not present

## 2013-02-04 DIAGNOSIS — R339 Retention of urine, unspecified: Secondary | ICD-10-CM | POA: Diagnosis not present

## 2013-02-21 DIAGNOSIS — I129 Hypertensive chronic kidney disease with stage 1 through stage 4 chronic kidney disease, or unspecified chronic kidney disease: Secondary | ICD-10-CM | POA: Diagnosis not present

## 2013-02-21 DIAGNOSIS — E119 Type 2 diabetes mellitus without complications: Secondary | ICD-10-CM | POA: Diagnosis not present

## 2013-02-21 DIAGNOSIS — E785 Hyperlipidemia, unspecified: Secondary | ICD-10-CM | POA: Diagnosis not present

## 2013-02-21 DIAGNOSIS — N183 Chronic kidney disease, stage 3 unspecified: Secondary | ICD-10-CM | POA: Diagnosis not present

## 2013-02-26 DIAGNOSIS — I129 Hypertensive chronic kidney disease with stage 1 through stage 4 chronic kidney disease, or unspecified chronic kidney disease: Secondary | ICD-10-CM | POA: Diagnosis not present

## 2013-02-26 DIAGNOSIS — I2510 Atherosclerotic heart disease of native coronary artery without angina pectoris: Secondary | ICD-10-CM | POA: Diagnosis not present

## 2013-02-26 DIAGNOSIS — N183 Chronic kidney disease, stage 3 unspecified: Secondary | ICD-10-CM | POA: Diagnosis not present

## 2013-02-26 DIAGNOSIS — E1129 Type 2 diabetes mellitus with other diabetic kidney complication: Secondary | ICD-10-CM | POA: Diagnosis not present

## 2013-03-05 DIAGNOSIS — E1139 Type 2 diabetes mellitus with other diabetic ophthalmic complication: Secondary | ICD-10-CM | POA: Diagnosis not present

## 2013-03-05 DIAGNOSIS — H353190 Nonexudative age-related macular degeneration, unspecified eye, stage unspecified: Secondary | ICD-10-CM | POA: Diagnosis not present

## 2013-03-05 DIAGNOSIS — H35379 Puckering of macula, unspecified eye: Secondary | ICD-10-CM | POA: Diagnosis not present

## 2013-03-05 DIAGNOSIS — E11349 Type 2 diabetes mellitus with severe nonproliferative diabetic retinopathy without macular edema: Secondary | ICD-10-CM | POA: Diagnosis not present

## 2013-03-10 ENCOUNTER — Encounter: Payer: Self-pay | Admitting: *Deleted

## 2013-03-14 ENCOUNTER — Encounter: Payer: Self-pay | Admitting: Cardiovascular Disease

## 2013-03-14 ENCOUNTER — Ambulatory Visit (INDEPENDENT_AMBULATORY_CARE_PROVIDER_SITE_OTHER): Payer: Medicare Other | Admitting: Cardiovascular Disease

## 2013-03-14 VITALS — BP 137/66 | HR 70 | Resp 16 | Ht 73.0 in | Wt 286.3 lb

## 2013-03-14 DIAGNOSIS — I10 Essential (primary) hypertension: Secondary | ICD-10-CM

## 2013-03-14 DIAGNOSIS — S88119A Complete traumatic amputation at level between knee and ankle, unspecified lower leg, initial encounter: Secondary | ICD-10-CM | POA: Diagnosis not present

## 2013-03-14 DIAGNOSIS — I798 Other disorders of arteries, arterioles and capillaries in diseases classified elsewhere: Secondary | ICD-10-CM

## 2013-03-14 DIAGNOSIS — K5730 Diverticulosis of large intestine without perforation or abscess without bleeding: Secondary | ICD-10-CM

## 2013-03-14 DIAGNOSIS — G4733 Obstructive sleep apnea (adult) (pediatric): Secondary | ICD-10-CM

## 2013-03-14 DIAGNOSIS — E1059 Type 1 diabetes mellitus with other circulatory complications: Secondary | ICD-10-CM | POA: Diagnosis not present

## 2013-03-14 DIAGNOSIS — D509 Iron deficiency anemia, unspecified: Secondary | ICD-10-CM

## 2013-03-14 DIAGNOSIS — M109 Gout, unspecified: Secondary | ICD-10-CM

## 2013-03-14 DIAGNOSIS — E1051 Type 1 diabetes mellitus with diabetic peripheral angiopathy without gangrene: Secondary | ICD-10-CM

## 2013-03-14 DIAGNOSIS — Z89511 Acquired absence of right leg below knee: Secondary | ICD-10-CM

## 2013-03-14 DIAGNOSIS — E1065 Type 1 diabetes mellitus with hyperglycemia: Secondary | ICD-10-CM

## 2013-03-14 DIAGNOSIS — E785 Hyperlipidemia, unspecified: Secondary | ICD-10-CM

## 2013-03-14 DIAGNOSIS — N183 Chronic kidney disease, stage 3 unspecified: Secondary | ICD-10-CM

## 2013-03-14 DIAGNOSIS — I4510 Unspecified right bundle-branch block: Secondary | ICD-10-CM

## 2013-03-14 NOTE — Patient Instructions (Addendum)
Your physician recommends that you schedule a follow-up appointment in: One year.  

## 2013-03-17 ENCOUNTER — Encounter: Payer: Self-pay | Admitting: Cardiovascular Disease

## 2013-03-17 DIAGNOSIS — I4510 Unspecified right bundle-branch block: Secondary | ICD-10-CM | POA: Insufficient documentation

## 2013-03-17 DIAGNOSIS — E782 Mixed hyperlipidemia: Secondary | ICD-10-CM | POA: Insufficient documentation

## 2013-03-17 DIAGNOSIS — Z89519 Acquired absence of unspecified leg below knee: Secondary | ICD-10-CM | POA: Insufficient documentation

## 2013-03-17 DIAGNOSIS — E1169 Type 2 diabetes mellitus with other specified complication: Secondary | ICD-10-CM | POA: Insufficient documentation

## 2013-03-17 DIAGNOSIS — D509 Iron deficiency anemia, unspecified: Secondary | ICD-10-CM | POA: Insufficient documentation

## 2013-03-17 DIAGNOSIS — I10 Essential (primary) hypertension: Secondary | ICD-10-CM | POA: Insufficient documentation

## 2013-03-17 DIAGNOSIS — G4733 Obstructive sleep apnea (adult) (pediatric): Secondary | ICD-10-CM | POA: Insufficient documentation

## 2013-03-17 DIAGNOSIS — E669 Obesity, unspecified: Secondary | ICD-10-CM | POA: Insufficient documentation

## 2013-03-17 DIAGNOSIS — K5730 Diverticulosis of large intestine without perforation or abscess without bleeding: Secondary | ICD-10-CM | POA: Insufficient documentation

## 2013-03-17 DIAGNOSIS — M109 Gout, unspecified: Secondary | ICD-10-CM | POA: Insufficient documentation

## 2013-03-17 DIAGNOSIS — N183 Chronic kidney disease, stage 3 unspecified: Secondary | ICD-10-CM | POA: Insufficient documentation

## 2013-03-17 NOTE — Progress Notes (Signed)
Patient ID: Micheal Walls, male   DOB: 1935-11-07, 77 y.o.   MRN: [id number]     Reason for office visit PAD, DM, Hyperlipidemia, OSA  Mr. Kulish returns for routine followup. There has been little, if any change in his medical status. He has a long-standing history of obesity, diabetes mellitus, hyperlipidemia and hypertension complicated by moderate chronic renal insufficiency and gangrene of the right lower extremity requiring a below the knee amputation.  He is wheelchair-bound. He has never been able to find a balance with a prosthesis.  He is compliant with CPAP therapy for sleep apnea in his blood pressure is usually well controlled.  His diabetes control is mediocre with a hemoglobin A1c that as usual is around 7.5%.  He has never had a cardiac catheterization. By noninvasive imaging has no evidence of nuclear perfusion abnormalities and has a normal left ventricular systolic function. Heart failure has not been an issue to date. He has chronic problems with iron deficiency anemia and has previously undergone upper endoscopy, colonoscopy and capsule enteroscopy without a clear source of bleeding other than some degree of right-sided colonic diverticulosis.  He has no complaints today.    No Known Allergies  Current Outpatient Prescriptions  Medication Sig Dispense Refill  . allopurinol (ZYLOPRIM) 100 MG tablet Take 100 mg by mouth daily.      Marland Kitchen aspirin 81 MG tablet Take 162 mg by mouth daily.      . colchicine 0.6 MG tablet Take 0.6 mg by mouth as needed.       . diltiazem (TIAZAC) 300 MG 24 hr capsule Take 300 mg by mouth daily.      . ferrous sulfate 325 (65 FE) MG tablet Take 325 mg by mouth daily with breakfast.      . fish oil-omega-3 fatty acids 1000 MG capsule Take 2 g by mouth daily.      . furosemide (LASIX) 40 MG tablet Take 40 mg by mouth daily.       . Insulin Detemir (LEVEMIR FLEXTOUCH) 100 UNIT/ML SOPN Inject 20 Units into the skin at bedtime.      Marland Kitchen losartan  (COZAAR) 50 MG tablet Take 50 mg by mouth daily.      . Multiple Vitamin (MULTIVITAMIN) tablet Take 1 tablet by mouth daily.      . niacin (NIASPAN) 500 MG CR tablet Take 1,000 mg by mouth at bedtime.      . pantoprazole (PROTONIX) 40 MG tablet Take 40 mg by mouth daily.      . rosuvastatin (CRESTOR) 10 MG tablet Take 10 mg by mouth daily.        No current facility-administered medications for this visit.    Past Medical History  Diagnosis Date  . Diverticulosis   . Gout   . OSA on CPAP   . Diastolic heart failure   . DM (diabetes mellitus)   . Hyperlipemia   . Systemic hypertension   . Amputated below knee     right  . Anemia   . RBBB     Past Surgical History  Procedure Laterality Date  . Leg amputation below knee      right  . Nm myocar perf wall motion  09/18/2009    no ischemia    Family History  Problem Relation Age of Onset  . Diabetes Mother   . Heart attack Father   . Cancer Sister     History   Social History  . Marital Status: Married  Spouse Name: N/A    Number of Children: N/A  . Years of Education: N/A   Occupational History  . Not on file.   Social History Main Topics  . Smoking status: Former Smoker    Types: Cigars    Quit date: 06/12/2001  . Smokeless tobacco: Not on file  . Alcohol Use: No  . Drug Use: No  . Sexual Activity: Not on file   Other Topics Concern  . Not on file   Social History Narrative  . No narrative on file    Review of systems: The patient specifically denies any chest pain at rest or with exertion, dyspnea at rest or with exertion, orthopnea, paroxysmal nocturnal dyspnea, syncope, palpitations, focal neurological deficits, intermittent claudication, lower extremity edema, unexplained weight gain, cough, hemoptysis or wheezing.  The patient also denies abdominal pain, nausea, vomiting, dysphagia, diarrhea, constipation, polyuria, polydipsia, dysuria, hematuria, frequency, urgency, abnormal bleeding or bruising,  fever, chills, unexpected weight changes, mood swings, change in skin or hair texture, change in voice quality, auditory or visual problems, allergic reactions or rashes, new musculoskeletal complaints other than usual "aches and pains".   PHYSICAL EXAM BP 156/68  Pulse 70  Resp 16  Ht $R'6\' 1"'ga$  (1.854 m)  Wt 286 lb 4.8 oz (129.865 kg)  BMI 37.78 kg/m2 Recheck BP 137/66  General: Alert, oriented x3, no distress Head: no evidence of trauma, PERRL, EOMI, no exophtalmos or lid lag, no myxedema, no xanthelasma; normal ears, nose and oropharynx Neck: normal jugular venous pulsations and no hepatojugular reflux; brisk carotid pulses without delay and no carotid bruits Chest: clear to auscultation, no signs of consolidation by percussion or palpation, normal fremitus, symmetrical and full respiratory excursions Cardiovascular: normal position and quality of the apical impulse, regular rhythm, normal first and widely split second heart sounds, no murmurs, rubs or gallops Abdomen: no tenderness or distention, no masses by palpation, no abnormal pulsatility or arterial bruits, normal bowel sounds, no hepatosplenomegaly Extremities: Status post right below-the-knee amputation; no clubbing, cyanosis or edema; 2+ radial, ulnar and brachial pulses bilaterally; 2+ right femoral,  pulses; 2+ left femoral, posterior tibial and dorsalis pedis pulses; no subclavian or femoral bruits Neurological: grossly nonfocal   EKG: Sinus rhythm with first degree AV block (260 ms) and chronic Right bundle branch block  Lipid Panel  Most recent results are not available  BMET    Component Value Date/Time   NA 138 01/14/2011 0512   K 4.1 01/14/2011 0512   CL 106 01/14/2011 0512   CO2 23 01/14/2011 0512   GLUCOSE 139* 01/14/2011 0512   BUN 36* 01/14/2011 0512   CREATININE 1.83* 01/14/2011 0512   CALCIUM 8.4 01/14/2011 0512   GFRNONAA 36* 01/14/2011 0512   GFRAA 44* 01/14/2011 0512     ASSESSMENT AND PLAN We will try to obtain  Everetts most recent labs from Dr. Tye Savoy and Dr. Moshe Cipro. He does not have any cardiovascular complaints at this time but he is at very high risk of serious coronary and peripheral vascular complications secondary to multiple risk factors, some of which are not well controlled, especially his diabetes. There is a high likelihood that he may have extensive coronary disease, but additional evaluation in the absence of symptoms and with a normal left ventricular ejection fraction does not appear to be justified. He is on appropriate medications for his hyperlipidemia. His target LDL cholesterol should be less than 100 mg/dL, preferably less than 70 mg/dL. His blood pressure control is good and he  takes an angiotensin receptor blocker in the setting of moderate chronic kidney disease likely due to diabetic nephropathy and hypertensive nephrosclerosis. He is on chronic iron supplements and we have not been able to identify the cause of his bleeding loss. He is able to tolerate low-dose aspirin without overt gastrointestinal bleeding.   Meds ordered this encounter  Medications  . Insulin Detemir (LEVEMIR FLEXTOUCH) 100 UNIT/ML SOPN    Sig: Inject 20 Units into the skin at bedtime.  . Multiple Vitamin (MULTIVITAMIN) tablet    Sig: Take 1 tablet by mouth daily.    Holli Humbles, MD, [place] [facility] [place] [phone] office [phone] pager

## 2013-03-18 ENCOUNTER — Encounter: Payer: Self-pay | Admitting: Cardiovascular Disease

## 2013-03-20 ENCOUNTER — Other Ambulatory Visit: Payer: Self-pay | Admitting: Cardiovascular Disease

## 2013-03-20 NOTE — Telephone Encounter (Signed)
Rx was sent to pharmacy electronically. 

## 2013-03-21 ENCOUNTER — Encounter: Payer: Self-pay | Admitting: Internal Medicine

## 2013-04-09 DIAGNOSIS — H04569 Stenosis of unspecified lacrimal punctum: Secondary | ICD-10-CM | POA: Diagnosis not present

## 2013-04-09 DIAGNOSIS — Z8679 Personal history of other diseases of the circulatory system: Secondary | ICD-10-CM | POA: Insufficient documentation

## 2013-04-09 DIAGNOSIS — D649 Anemia, unspecified: Secondary | ICD-10-CM | POA: Insufficient documentation

## 2013-04-09 DIAGNOSIS — E1139 Type 2 diabetes mellitus with other diabetic ophthalmic complication: Secondary | ICD-10-CM | POA: Diagnosis not present

## 2013-04-09 DIAGNOSIS — E11329 Type 2 diabetes mellitus with mild nonproliferative diabetic retinopathy without macular edema: Secondary | ICD-10-CM | POA: Diagnosis not present

## 2013-04-09 DIAGNOSIS — Z8673 Personal history of transient ischemic attack (TIA), and cerebral infarction without residual deficits: Secondary | ICD-10-CM | POA: Insufficient documentation

## 2013-04-09 DIAGNOSIS — E119 Type 2 diabetes mellitus without complications: Secondary | ICD-10-CM | POA: Insufficient documentation

## 2013-04-15 DIAGNOSIS — H4011X0 Primary open-angle glaucoma, stage unspecified: Secondary | ICD-10-CM | POA: Diagnosis not present

## 2013-04-15 DIAGNOSIS — H43819 Vitreous degeneration, unspecified eye: Secondary | ICD-10-CM | POA: Diagnosis not present

## 2013-04-15 DIAGNOSIS — H409 Unspecified glaucoma: Secondary | ICD-10-CM | POA: Diagnosis not present

## 2013-04-15 DIAGNOSIS — H35039 Hypertensive retinopathy, unspecified eye: Secondary | ICD-10-CM | POA: Diagnosis not present

## 2013-04-15 DIAGNOSIS — E11319 Type 2 diabetes mellitus with unspecified diabetic retinopathy without macular edema: Secondary | ICD-10-CM | POA: Diagnosis not present

## 2013-04-15 DIAGNOSIS — E119 Type 2 diabetes mellitus without complications: Secondary | ICD-10-CM | POA: Diagnosis not present

## 2013-04-15 DIAGNOSIS — H04129 Dry eye syndrome of unspecified lacrimal gland: Secondary | ICD-10-CM | POA: Diagnosis not present

## 2013-04-19 DIAGNOSIS — H04209 Unspecified epiphora, unspecified lacrimal gland: Secondary | ICD-10-CM | POA: Diagnosis not present

## 2013-04-19 DIAGNOSIS — Z87891 Personal history of nicotine dependence: Secondary | ICD-10-CM | POA: Diagnosis not present

## 2013-04-19 DIAGNOSIS — N189 Chronic kidney disease, unspecified: Secondary | ICD-10-CM | POA: Diagnosis not present

## 2013-04-19 DIAGNOSIS — R609 Edema, unspecified: Secondary | ICD-10-CM | POA: Diagnosis not present

## 2013-04-19 DIAGNOSIS — H04569 Stenosis of unspecified lacrimal punctum: Secondary | ICD-10-CM | POA: Diagnosis not present

## 2013-04-19 DIAGNOSIS — I129 Hypertensive chronic kidney disease with stage 1 through stage 4 chronic kidney disease, or unspecified chronic kidney disease: Secondary | ICD-10-CM | POA: Diagnosis not present

## 2013-04-19 DIAGNOSIS — G4733 Obstructive sleep apnea (adult) (pediatric): Secondary | ICD-10-CM | POA: Diagnosis not present

## 2013-04-19 DIAGNOSIS — H02109 Unspecified ectropion of unspecified eye, unspecified eyelid: Secondary | ICD-10-CM | POA: Diagnosis not present

## 2013-04-19 DIAGNOSIS — E119 Type 2 diabetes mellitus without complications: Secondary | ICD-10-CM | POA: Diagnosis not present

## 2013-04-19 DIAGNOSIS — K219 Gastro-esophageal reflux disease without esophagitis: Secondary | ICD-10-CM | POA: Diagnosis not present

## 2013-04-19 DIAGNOSIS — D649 Anemia, unspecified: Secondary | ICD-10-CM | POA: Diagnosis not present

## 2013-04-19 DIAGNOSIS — E785 Hyperlipidemia, unspecified: Secondary | ICD-10-CM | POA: Diagnosis not present

## 2013-04-22 ENCOUNTER — Encounter: Payer: Self-pay | Admitting: Podiatry

## 2013-04-22 ENCOUNTER — Ambulatory Visit (INDEPENDENT_AMBULATORY_CARE_PROVIDER_SITE_OTHER): Payer: Medicare Other | Admitting: Podiatry

## 2013-04-22 VITALS — BP 164/54 | HR 78 | Resp 16

## 2013-04-22 DIAGNOSIS — B351 Tinea unguium: Secondary | ICD-10-CM

## 2013-04-22 DIAGNOSIS — M79609 Pain in unspecified limb: Secondary | ICD-10-CM

## 2013-04-23 DIAGNOSIS — Z006 Encounter for examination for normal comparison and control in clinical research program: Secondary | ICD-10-CM | POA: Diagnosis not present

## 2013-04-23 DIAGNOSIS — D508 Other iron deficiency anemias: Secondary | ICD-10-CM | POA: Diagnosis not present

## 2013-04-23 DIAGNOSIS — E119 Type 2 diabetes mellitus without complications: Secondary | ICD-10-CM | POA: Diagnosis not present

## 2013-04-23 DIAGNOSIS — E559 Vitamin D deficiency, unspecified: Secondary | ICD-10-CM | POA: Diagnosis not present

## 2013-04-23 DIAGNOSIS — K219 Gastro-esophageal reflux disease without esophagitis: Secondary | ICD-10-CM | POA: Diagnosis not present

## 2013-04-23 DIAGNOSIS — R339 Retention of urine, unspecified: Secondary | ICD-10-CM | POA: Diagnosis not present

## 2013-04-23 DIAGNOSIS — N058 Unspecified nephritic syndrome with other morphologic changes: Secondary | ICD-10-CM | POA: Diagnosis not present

## 2013-04-23 DIAGNOSIS — N183 Chronic kidney disease, stage 3 unspecified: Secondary | ICD-10-CM | POA: Diagnosis not present

## 2013-04-23 DIAGNOSIS — I2510 Atherosclerotic heart disease of native coronary artery without angina pectoris: Secondary | ICD-10-CM | POA: Diagnosis not present

## 2013-04-23 DIAGNOSIS — I129 Hypertensive chronic kidney disease with stage 1 through stage 4 chronic kidney disease, or unspecified chronic kidney disease: Secondary | ICD-10-CM | POA: Diagnosis not present

## 2013-04-23 DIAGNOSIS — Z79899 Other long term (current) drug therapy: Secondary | ICD-10-CM | POA: Diagnosis not present

## 2013-04-23 DIAGNOSIS — E1129 Type 2 diabetes mellitus with other diabetic kidney complication: Secondary | ICD-10-CM | POA: Diagnosis not present

## 2013-04-23 DIAGNOSIS — E7800 Pure hypercholesterolemia, unspecified: Secondary | ICD-10-CM | POA: Diagnosis not present

## 2013-04-23 DIAGNOSIS — M109 Gout, unspecified: Secondary | ICD-10-CM | POA: Diagnosis not present

## 2013-04-23 NOTE — Progress Notes (Signed)
Subjective:     Patient ID: Micheal Walls, male   DOB: 06-16-1935, 77 y.o.   MRN: [id number]  HPI patient states cut my toenails I cannot do myself and they bother me and I already have lost one leg   Review of Systems     Objective:   Physical Exam Neurovascular status intact.   nail disease 1-5 left foot with pain Assessment:     Chronic nails and at risk vascular compromise patient    Plan:     Debrided nailbeds 1-5 left with no iatrogenic bleeding noted

## 2013-04-24 DIAGNOSIS — H04209 Unspecified epiphora, unspecified lacrimal gland: Secondary | ICD-10-CM | POA: Insufficient documentation

## 2013-04-24 DIAGNOSIS — H0259 Other disorders affecting eyelid function: Secondary | ICD-10-CM | POA: Insufficient documentation

## 2013-05-15 DIAGNOSIS — E1039 Type 1 diabetes mellitus with other diabetic ophthalmic complication: Secondary | ICD-10-CM | POA: Diagnosis not present

## 2013-05-15 DIAGNOSIS — H31019 Macula scars of posterior pole (postinflammatory) (post-traumatic), unspecified eye: Secondary | ICD-10-CM | POA: Diagnosis not present

## 2013-05-15 DIAGNOSIS — E11329 Type 2 diabetes mellitus with mild nonproliferative diabetic retinopathy without macular edema: Secondary | ICD-10-CM | POA: Diagnosis not present

## 2013-05-23 DIAGNOSIS — N182 Chronic kidney disease, stage 2 (mild): Secondary | ICD-10-CM | POA: Diagnosis not present

## 2013-05-23 DIAGNOSIS — E1129 Type 2 diabetes mellitus with other diabetic kidney complication: Secondary | ICD-10-CM | POA: Diagnosis not present

## 2013-05-23 DIAGNOSIS — I129 Hypertensive chronic kidney disease with stage 1 through stage 4 chronic kidney disease, or unspecified chronic kidney disease: Secondary | ICD-10-CM | POA: Diagnosis not present

## 2013-05-23 DIAGNOSIS — N058 Unspecified nephritic syndrome with other morphologic changes: Secondary | ICD-10-CM | POA: Diagnosis not present

## 2013-07-05 DIAGNOSIS — H2510 Age-related nuclear cataract, unspecified eye: Secondary | ICD-10-CM | POA: Diagnosis not present

## 2013-07-05 DIAGNOSIS — H409 Unspecified glaucoma: Secondary | ICD-10-CM | POA: Diagnosis not present

## 2013-07-05 DIAGNOSIS — H04129 Dry eye syndrome of unspecified lacrimal gland: Secondary | ICD-10-CM | POA: Diagnosis not present

## 2013-07-05 DIAGNOSIS — H04209 Unspecified epiphora, unspecified lacrimal gland: Secondary | ICD-10-CM | POA: Diagnosis not present

## 2013-07-05 DIAGNOSIS — H4011X0 Primary open-angle glaucoma, stage unspecified: Secondary | ICD-10-CM | POA: Diagnosis not present

## 2013-07-22 ENCOUNTER — Ambulatory Visit (INDEPENDENT_AMBULATORY_CARE_PROVIDER_SITE_OTHER): Payer: Medicare Other | Admitting: Podiatry

## 2013-07-22 VITALS — BP 174/54 | HR 76 | Resp 16 | Ht 72.0 in | Wt 285.0 lb

## 2013-07-22 DIAGNOSIS — B351 Tinea unguium: Secondary | ICD-10-CM

## 2013-07-22 DIAGNOSIS — M79609 Pain in unspecified limb: Secondary | ICD-10-CM | POA: Diagnosis not present

## 2013-07-23 NOTE — Progress Notes (Signed)
Subjective:     Patient ID: Micheal Walls, male   DOB: 10/22/1935, 78 y.o.   MRN: [id number]  HPI patient is found to have nail disease 1-5 left foot with significant risk factors and loss of right leg   Review of Systems     Objective:   Physical Exam Neurovascular status unchanged left with thick painful nail bed 1-5 left foot    Assessment:     Chronic mycotic nail infection 1-5 left    Plan:     Debridement painful nailbeds 1-5 left no iatrogenic bleeding noted

## 2013-07-24 DIAGNOSIS — N184 Chronic kidney disease, stage 4 (severe): Secondary | ICD-10-CM | POA: Diagnosis not present

## 2013-07-24 DIAGNOSIS — I129 Hypertensive chronic kidney disease with stage 1 through stage 4 chronic kidney disease, or unspecified chronic kidney disease: Secondary | ICD-10-CM | POA: Diagnosis not present

## 2013-07-24 DIAGNOSIS — Z79899 Other long term (current) drug therapy: Secondary | ICD-10-CM | POA: Diagnosis not present

## 2013-07-24 DIAGNOSIS — N182 Chronic kidney disease, stage 2 (mild): Secondary | ICD-10-CM | POA: Diagnosis not present

## 2013-07-24 DIAGNOSIS — R5381 Other malaise: Secondary | ICD-10-CM | POA: Diagnosis not present

## 2013-07-24 DIAGNOSIS — R5383 Other fatigue: Secondary | ICD-10-CM | POA: Diagnosis not present

## 2013-07-24 DIAGNOSIS — D508 Other iron deficiency anemias: Secondary | ICD-10-CM | POA: Diagnosis not present

## 2013-07-24 DIAGNOSIS — E1129 Type 2 diabetes mellitus with other diabetic kidney complication: Secondary | ICD-10-CM | POA: Diagnosis not present

## 2013-07-24 DIAGNOSIS — N058 Unspecified nephritic syndrome with other morphologic changes: Secondary | ICD-10-CM | POA: Diagnosis not present

## 2013-07-24 DIAGNOSIS — E7800 Pure hypercholesterolemia, unspecified: Secondary | ICD-10-CM | POA: Diagnosis not present

## 2013-10-03 DIAGNOSIS — E1139 Type 2 diabetes mellitus with other diabetic ophthalmic complication: Secondary | ICD-10-CM | POA: Diagnosis not present

## 2013-10-03 DIAGNOSIS — E11349 Type 2 diabetes mellitus with severe nonproliferative diabetic retinopathy without macular edema: Secondary | ICD-10-CM | POA: Diagnosis not present

## 2013-10-21 ENCOUNTER — Encounter: Payer: Self-pay | Admitting: Podiatry

## 2013-10-21 ENCOUNTER — Ambulatory Visit (INDEPENDENT_AMBULATORY_CARE_PROVIDER_SITE_OTHER): Payer: Medicare Other | Admitting: Podiatry

## 2013-10-21 DIAGNOSIS — B351 Tinea unguium: Secondary | ICD-10-CM

## 2013-10-21 DIAGNOSIS — M79609 Pain in unspecified limb: Secondary | ICD-10-CM | POA: Diagnosis not present

## 2013-10-21 NOTE — Patient Instructions (Signed)
Diabetes and Foot Care Diabetes may cause you to have problems because of poor blood supply (circulation) to your feet and legs. This may cause the skin on your feet to become thinner, break easier, and heal more slowly. Your skin may become dry, and the skin may peel and crack. You may also have nerve damage in your legs and feet causing decreased feeling in them. You may not notice minor injuries to your feet that could lead to infections or more serious problems. Taking care of your feet is one of the most important things you can do for yourself.  HOME CARE INSTRUCTIONS  Wear shoes at all times, even in the house. Do not go barefoot. Bare feet are easily injured.  Check your feet daily for blisters, cuts, and redness. If you cannot see the bottom of your feet, use a mirror or ask someone for help.  Wash your feet with warm water (do not use hot water) and mild soap. Then pat your feet and the areas between your toes until they are completely dry. Do not soak your feet as this can dry your skin.  Apply a moisturizing lotion or petroleum jelly (that does not contain alcohol and is unscented) to the skin on your feet and to dry, brittle toenails. Do not apply lotion between your toes.  Trim your toenails straight across. Do not dig under them or around the cuticle. File the edges of your nails with an emery board or nail file.  Do not cut corns or calluses or try to remove them with medicine.  Wear clean socks or stockings every day. Make sure they are not too tight. Do not wear knee-high stockings since they may decrease blood flow to your legs.  Wear shoes that fit properly and have enough cushioning. To break in new shoes, wear them for just a few hours a day. This prevents you from injuring your feet. Always look in your shoes before you put them on to be sure there are no objects inside.  Do not cross your legs. This may decrease the blood flow to your feet.  If you find a minor scrape,  cut, or break in the skin on your feet, keep it and the skin around it clean and dry. These areas may be cleansed with mild soap and water. Do not cleanse the area with peroxide, alcohol, or iodine.  When you remove an adhesive bandage, be sure not to damage the skin around it.  If you have a wound, look at it several times a day to make sure it is healing.  Do not use heating pads or hot water bottles. They may burn your skin. If you have lost feeling in your feet or legs, you may not know it is happening until it is too late.  Make sure your health care provider performs a complete foot exam at least annually or more often if you have foot problems. Report any cuts, sores, or bruises to your health care provider immediately. SEEK MEDICAL CARE IF:   You have an injury that is not healing.  You have cuts or breaks in the skin.  You have an ingrown nail.  You notice redness on your legs or feet.  You feel burning or tingling in your legs or feet.  You have pain or cramps in your legs and feet.  Your legs or feet are numb.  Your feet always feel cold. SEEK IMMEDIATE MEDICAL CARE IF:   There is increasing redness,   swelling, or pain in or around a wound.  There is a red line that goes up your leg.  Pus is coming from a wound.  You develop a fever or as directed by your health care provider.  You notice a bad smell coming from an ulcer or wound. Document Released: 05/27/2000 Document Revised: 01/30/2013 Document Reviewed: 11/06/2012 Exit[employer] Patient Information 2014 [employer].  

## 2013-10-21 NOTE — Progress Notes (Signed)
Subjective:     Patient ID: Micheal Walls, male   DOB: Jan 30, 1936, 78 y.o.   MRN: [id number]  HPI long-term diabetic with loss right leg and nail disease 1-5 left foot that are painful   Review of Systems     Objective:   Physical Exam Neurovascular unchanged with thick painful nailbeds 1-5 left foot    Assessment:     Mycotic nail infection with pain 1-5 left foot    Plan:     Debridement painful nailbeds 1-5 left foot no bleeding noted

## 2013-10-28 DIAGNOSIS — N058 Unspecified nephritic syndrome with other morphologic changes: Secondary | ICD-10-CM | POA: Diagnosis not present

## 2013-10-28 DIAGNOSIS — E1129 Type 2 diabetes mellitus with other diabetic kidney complication: Secondary | ICD-10-CM | POA: Diagnosis not present

## 2013-10-28 DIAGNOSIS — N183 Chronic kidney disease, stage 3 unspecified: Secondary | ICD-10-CM | POA: Diagnosis not present

## 2013-10-28 DIAGNOSIS — E7800 Pure hypercholesterolemia, unspecified: Secondary | ICD-10-CM | POA: Diagnosis not present

## 2013-10-28 DIAGNOSIS — M109 Gout, unspecified: Secondary | ICD-10-CM | POA: Diagnosis not present

## 2013-10-28 DIAGNOSIS — Z79899 Other long term (current) drug therapy: Secondary | ICD-10-CM | POA: Diagnosis not present

## 2013-10-28 DIAGNOSIS — I129 Hypertensive chronic kidney disease with stage 1 through stage 4 chronic kidney disease, or unspecified chronic kidney disease: Secondary | ICD-10-CM | POA: Diagnosis not present

## 2014-01-27 ENCOUNTER — Ambulatory Visit (INDEPENDENT_AMBULATORY_CARE_PROVIDER_SITE_OTHER): Payer: Medicare Other | Admitting: Podiatry

## 2014-01-27 DIAGNOSIS — M79673 Pain in unspecified foot: Secondary | ICD-10-CM

## 2014-01-27 DIAGNOSIS — M79609 Pain in unspecified limb: Secondary | ICD-10-CM | POA: Diagnosis not present

## 2014-01-27 DIAGNOSIS — B351 Tinea unguium: Secondary | ICD-10-CM

## 2014-01-27 NOTE — Patient Instructions (Signed)
Diabetes and Foot Care Diabetes may cause you to have problems because of poor blood supply (circulation) to your feet and legs. This may cause the skin on your feet to become thinner, break easier, and heal more slowly. Your skin may become dry, and the skin may peel and crack. You may also have nerve damage in your legs and feet causing decreased feeling in them. You may not notice minor injuries to your feet that could lead to infections or more serious problems. Taking care of your feet is one of the most important things you can do for yourself.  HOME CARE INSTRUCTIONS  Wear shoes at all times, even in the house. Do not go barefoot. Bare feet are easily injured.  Check your feet daily for blisters, cuts, and redness. If you cannot see the bottom of your feet, use a mirror or ask someone for help.  Wash your feet with warm water (do not use hot water) and mild soap. Then pat your feet and the areas between your toes until they are completely dry. Do not soak your feet as this can dry your skin.  Apply a moisturizing lotion or petroleum jelly (that does not contain alcohol and is unscented) to the skin on your feet and to dry, brittle toenails. Do not apply lotion between your toes.  Trim your toenails straight across. Do not dig under them or around the cuticle. File the edges of your nails with an emery board or nail file.  Do not cut corns or calluses or try to remove them with medicine.  Wear clean socks or stockings every day. Make sure they are not too tight. Do not wear knee-high stockings since they may decrease blood flow to your legs.  Wear shoes that fit properly and have enough cushioning. To break in new shoes, wear them for just a few hours a day. This prevents you from injuring your feet. Always look in your shoes before you put them on to be sure there are no objects inside.  Do not cross your legs. This may decrease the blood flow to your feet.  If you find a minor scrape,  cut, or break in the skin on your feet, keep it and the skin around it clean and dry. These areas may be cleansed with mild soap and water. Do not cleanse the area with peroxide, alcohol, or iodine.  When you remove an adhesive bandage, be sure not to damage the skin around it.  If you have a wound, look at it several times a day to make sure it is healing.  Do not use heating pads or hot water bottles. They may burn your skin. If you have lost feeling in your feet or legs, you may not know it is happening until it is too late.  Make sure your health care provider performs a complete foot exam at least annually or more often if you have foot problems. Report any cuts, sores, or bruises to your health care provider immediately. SEEK MEDICAL CARE IF:   You have an injury that is not healing.  You have cuts or breaks in the skin.  You have an ingrown nail.  You notice redness on your legs or feet.  You feel burning or tingling in your legs or feet.  You have pain or cramps in your legs and feet.  Your legs or feet are numb.  Your feet always feel cold. SEEK IMMEDIATE MEDICAL CARE IF:   There is increasing redness,   swelling, or pain in or around a wound.  There is a red line that goes up your leg.  Pus is coming from a wound.  You develop a fever or as directed by your health care provider.  You notice a bad smell coming from an ulcer or wound. Document Released: 05/27/2000 Document Revised: 01/30/2013 Document Reviewed: 11/06/2012 Exit[employer] Patient Information 2015 [employer]. This information is not intended to replace advice given to you by your health care provider. Make sure you discuss any questions you have with your health care provider.  

## 2014-01-27 NOTE — Progress Notes (Signed)
   Subjective:    Patient ID: Micheal Walls, male    DOB: [birth year], 78 y.o.   MRN: [id number]  HPI   Pt presents for debridement of nails 1-5 Review of Systems     Objective:   Physical Exam        Assessment & Plan:

## 2014-01-28 DIAGNOSIS — N058 Unspecified nephritic syndrome with other morphologic changes: Secondary | ICD-10-CM | POA: Diagnosis not present

## 2014-01-28 DIAGNOSIS — I129 Hypertensive chronic kidney disease with stage 1 through stage 4 chronic kidney disease, or unspecified chronic kidney disease: Secondary | ICD-10-CM | POA: Diagnosis not present

## 2014-01-28 DIAGNOSIS — E1129 Type 2 diabetes mellitus with other diabetic kidney complication: Secondary | ICD-10-CM | POA: Diagnosis not present

## 2014-01-28 DIAGNOSIS — Z79899 Other long term (current) drug therapy: Secondary | ICD-10-CM | POA: Diagnosis not present

## 2014-01-28 DIAGNOSIS — N183 Chronic kidney disease, stage 3 unspecified: Secondary | ICD-10-CM | POA: Diagnosis not present

## 2014-01-28 DIAGNOSIS — E1165 Type 2 diabetes mellitus with hyperglycemia: Secondary | ICD-10-CM | POA: Diagnosis not present

## 2014-01-29 NOTE — Progress Notes (Signed)
Subjective:     Patient ID: Micheal Walls, male   DOB: September 26, 1935, 78 y.o.   MRN: [id number]  HPI at risk patient with nail disease 1-5 left which are thick and painful when pressed after losing right leg   Review of Systems     Objective:   Physical Exam Neurovascular status left unchanged with nail disease thickness and incurvation 1-5 left    Assessment:     At risk patient with painful mycotic nailbeds 1-5 left foot    Plan:     Debridement nails 1-5 left and reappoint for regular appointment and let us any issues should occur

## 2014-03-05 ENCOUNTER — Ambulatory Visit (INDEPENDENT_AMBULATORY_CARE_PROVIDER_SITE_OTHER): Payer: Medicare Other | Admitting: Podiatry

## 2014-03-05 ENCOUNTER — Other Ambulatory Visit: Payer: Medicare Other

## 2014-03-05 ENCOUNTER — Encounter: Payer: Self-pay | Admitting: Podiatry

## 2014-03-05 VITALS — BP 186/78 | HR 83 | Resp 18

## 2014-03-05 DIAGNOSIS — B351 Tinea unguium: Secondary | ICD-10-CM

## 2014-03-05 DIAGNOSIS — M79609 Pain in unspecified limb: Secondary | ICD-10-CM

## 2014-03-05 DIAGNOSIS — M79675 Pain in left toe(s): Secondary | ICD-10-CM

## 2014-03-05 NOTE — Patient Instructions (Addendum)
Monitor for any signs/symptoms of infection. Call the office immediately if any occur or go directly to the emergency room. Call with any questions/concerns.  

## 2014-03-05 NOTE — Progress Notes (Signed)
Patient ID: SANTINO KINSELLA, male   DOB: 1935/06/18, 78 y.o.   MRN: [id number]  Subjective: Mr. Nilsson is into the office today for evaluation of left second digit nail. Patient states that he previously had some discomfort to the left second digit nail however he currently denies any pain around the site. States that the area has since resolved and is having no discomfort. Denies any redness surgery in around the nail site. No other complaints at this time. Since that he is diabetic his blood sugar typically run around 160.  Objective: AAO x3, NAD DP/PT pulse decreased Decreased protective sensation with SWMF S/p R BKA Left second digit nail with mild elongation and slight ingrowing along the very distal lateral border of the nail. No surrounding erythema, drainage, edema. Mild tenderness along the distal nail border with no pain along the lateral/proximal borders.  All nails with mild incurvation but no surrounding erythema or drainage. No open skin lesions or pre-ulcerative lesions. No leg pain, swelling, warmth.  Assessment: 78 year old male with mild distal lateral nail incurvation, no signs of infection  Plan: -Various treatment options discussed including alternatives, risks, complications. -Left second digit nail sharply debrided without complications to patient comfort. Upon debridement patient had relief of symptoms. Discussed signs and symptoms of infection and directed to call the office immediately if any are to occur or go directly to the emergency room. -Follow up in 2 months for nail debridement. In the meantime, call with any questions/concerns/change in symptoms.

## 2014-03-12 DIAGNOSIS — E118 Type 2 diabetes mellitus with unspecified complications: Secondary | ICD-10-CM | POA: Diagnosis not present

## 2014-03-12 DIAGNOSIS — N183 Chronic kidney disease, stage 3 unspecified: Secondary | ICD-10-CM | POA: Diagnosis not present

## 2014-03-12 DIAGNOSIS — I129 Hypertensive chronic kidney disease with stage 1 through stage 4 chronic kidney disease, or unspecified chronic kidney disease: Secondary | ICD-10-CM | POA: Diagnosis not present

## 2014-03-12 DIAGNOSIS — N3289 Other specified disorders of bladder: Secondary | ICD-10-CM | POA: Diagnosis not present

## 2014-04-03 DIAGNOSIS — E11359 Type 2 diabetes mellitus with proliferative diabetic retinopathy without macular edema: Secondary | ICD-10-CM | POA: Diagnosis not present

## 2014-04-03 DIAGNOSIS — H4011X3 Primary open-angle glaucoma, severe stage: Secondary | ICD-10-CM | POA: Diagnosis not present

## 2014-04-03 DIAGNOSIS — E11349 Type 2 diabetes mellitus with severe nonproliferative diabetic retinopathy without macular edema: Secondary | ICD-10-CM | POA: Diagnosis not present

## 2014-05-05 ENCOUNTER — Other Ambulatory Visit: Payer: Medicare Other

## 2014-05-07 ENCOUNTER — Ambulatory Visit (INDEPENDENT_AMBULATORY_CARE_PROVIDER_SITE_OTHER): Payer: Medicare Other | Admitting: Podiatry

## 2014-05-07 ENCOUNTER — Encounter: Payer: Self-pay | Admitting: Podiatry

## 2014-05-07 VITALS — BP 125/53 | HR 88 | Resp 18

## 2014-05-07 DIAGNOSIS — M79675 Pain in left toe(s): Secondary | ICD-10-CM

## 2014-05-07 DIAGNOSIS — B351 Tinea unguium: Secondary | ICD-10-CM

## 2014-05-07 DIAGNOSIS — L600 Ingrowing nail: Secondary | ICD-10-CM | POA: Diagnosis not present

## 2014-05-07 DIAGNOSIS — L988 Other specified disorders of the skin and subcutaneous tissue: Secondary | ICD-10-CM | POA: Diagnosis not present

## 2014-05-07 MED ORDER — CEPHALEXIN 500 MG PO CAPS: 500.0000 mg | ORAL_CAPSULE | Freq: Three times a day (TID) | ORAL | Status: DC

## 2014-05-07 NOTE — Patient Instructions (Signed)
Monitor the left big toe. Monitor for any signs/symptoms of infection. Call the office immediately if any occur or go directly to the emergency room. Call with any questions/concerns. Start Antibiotics.

## 2014-05-12 ENCOUNTER — Encounter: Payer: Self-pay | Admitting: Podiatry

## 2014-05-12 DIAGNOSIS — E780 Pure hypercholesterolemia: Secondary | ICD-10-CM | POA: Diagnosis not present

## 2014-05-12 DIAGNOSIS — I129 Hypertensive chronic kidney disease with stage 1 through stage 4 chronic kidney disease, or unspecified chronic kidney disease: Secondary | ICD-10-CM | POA: Diagnosis not present

## 2014-05-12 DIAGNOSIS — R635 Abnormal weight gain: Secondary | ICD-10-CM | POA: Diagnosis not present

## 2014-05-12 DIAGNOSIS — N183 Chronic kidney disease, stage 3 (moderate): Secondary | ICD-10-CM | POA: Diagnosis not present

## 2014-05-12 DIAGNOSIS — E1121 Type 2 diabetes mellitus with diabetic nephropathy: Secondary | ICD-10-CM | POA: Diagnosis not present

## 2014-05-12 DIAGNOSIS — I69059 Hemiplegia and hemiparesis following nontraumatic subarachnoid hemorrhage affecting unspecified side: Secondary | ICD-10-CM | POA: Diagnosis not present

## 2014-05-12 NOTE — Progress Notes (Signed)
Patient ID: Micheal Walls, male   DOB: Oct 20, 1935, 78 y.o.   MRN: [id number]  Subjective: 78 year old male returns the office today for diabetic risk assessment and for painful elongated nails. He states the nails are painful particularly with shoe gear. He denies any acute changes since last appointment. Denies any systemic complaints as fevers, chills, nausea, vomiting. No other complaints at this time.  Objective: AAO 3, NAD DP/PT pulses decreased Protective sensation decreased with Simms Weinstein monofilament Previous right below-knee amputation. Nails hypertrophic, dystrophic, elongated, brittle 5. The left hallux nail with mild incurvation distally on both the medial and lateral nail borders. Along the medial aspect of the hallux there is localized erythema and edema. There is no drainage or purulence identified. No ascending cellulitis. No tenderness to palpation over the area (patient states he has not noticed the redness/swelling and is unsure how long this has been present).  There is maceration within the first interspace on the left foot. Upon debridement there was no specific ulceration noted although the skin was raw and pink. No clinical signs of infection. No pain with calf compression, swelling, warmth, erythema.  Assessment: 78 year old male symptomatic onychomycosis; ingrown toenail left hallux with erythema  Plan: -Treatment options were discussed including alternatives, risks, complications. -Nail sharply debrided 5 without complications. -Hallux nail was sharply debrided of the offending nail borders. There is no drainage or purulence identified. Due to the erythema started the patient on Keflex. Given the history patients vascular disease we'll hold off on partial nail avulsion at this time. If symptoms continue will likely need to remove at least portion of the hallux nail. Either way, discussed with the patient that he is at high risk of amputation should the  infection worsen, or if we remove the nail and it does not heal.  -The first interspace maceration the area was debrided of nonviable tissue. After debridement there is raw, new skin in the interspace without any clinical signs of infection. Discussed with the patient to keep this area dry/clean. Monitor for any skin breakdown. -Follow-up in 1 week. In the meantime, call the office with any questions, concerns, change in symptoms. Monitoring clinical signs or symptoms of infection and directed to call the office immediately if any are to occur or go to the emergency room.

## 2014-05-14 ENCOUNTER — Encounter: Payer: Self-pay | Admitting: Podiatry

## 2014-05-14 ENCOUNTER — Ambulatory Visit (INDEPENDENT_AMBULATORY_CARE_PROVIDER_SITE_OTHER): Payer: Medicare Other | Admitting: Podiatry

## 2014-05-14 VITALS — BP 132/63 | HR 86 | Resp 18

## 2014-05-14 DIAGNOSIS — L600 Ingrowing nail: Secondary | ICD-10-CM

## 2014-05-14 NOTE — Patient Instructions (Signed)
Finish course of antibiotics.  Monitor for any signs/symptoms of infection. Call the office immediately if any occur or go directly to the emergency room. Call with any questions/concerns.

## 2014-05-14 NOTE — Progress Notes (Signed)
Patient ID: Micheal Walls, male   DOB: Mar 08, 1936, 78 y.o.   MRN: [id number]  Subjective: 78 year old male returns the office they for evaluation of erythema to the left hallux. He states that he has started the antibiotics he is continued on them. He doesn't the toe looks better compared to last appointment and there is no drainage from the nail. She's of the redness the swelling has decreased. He denies any systemic complaints as fevers, chills, nausea, vomiting. No other complaints at this time. No acute changes since last appointment.  Objective: AAO 3, NAD DP/PT pulses decreased Protective sensation decreased with Simms Weinstein monofilament,. Previous below-knee amputation right lower extremity There is decreased erythema and edema to the left hallux medial aspect. There is no areas of fluctuance or crepitus. There is mild ingrowing to the distal medial aspect of the nail nail. There is no drainage or purulence identified. No tenderness to palpation. No ascending cellulitis.  No pain with calf compression/swelling/warmth/erythema   Assessment: 78 year old male with resolving erythema left hallux.  Plan: -Treatment options discussed including alternatives, risks, complications. -At this time there is decreased erythema and edema after certain antibiotic. Recommend continue and finish the course of antibiotics. Continue monitor area for any increase in symptoms. There is any signs worsening signs or symptoms of infection and directed to go directly to the emergency room or call the office. The nail was again debrided to remove any offending nail borders which may be the source source however there is no significant ingrowing or drainage from around the nail. -Continue monitor area. -Patient directed to follow-up in 2 weeks of the area has not completely healed or to call sooner if there is any worsening symptoms. Otherwise I'll see him back in 3 months for his regular scheduled  appointment. Call any questions, concerns, change in symptoms.

## 2014-05-20 DIAGNOSIS — H4011X2 Primary open-angle glaucoma, moderate stage: Secondary | ICD-10-CM | POA: Diagnosis not present

## 2014-05-20 DIAGNOSIS — Z961 Presence of intraocular lens: Secondary | ICD-10-CM | POA: Diagnosis not present

## 2014-05-20 DIAGNOSIS — E119 Type 2 diabetes mellitus without complications: Secondary | ICD-10-CM | POA: Diagnosis not present

## 2014-05-20 DIAGNOSIS — E11319 Type 2 diabetes mellitus with unspecified diabetic retinopathy without macular edema: Secondary | ICD-10-CM | POA: Diagnosis not present

## 2014-06-04 DIAGNOSIS — E11341 Type 2 diabetes mellitus with severe nonproliferative diabetic retinopathy with macular edema: Secondary | ICD-10-CM | POA: Diagnosis not present

## 2014-06-10 DIAGNOSIS — E11341 Type 2 diabetes mellitus with severe nonproliferative diabetic retinopathy with macular edema: Secondary | ICD-10-CM | POA: Diagnosis not present

## 2014-07-15 DIAGNOSIS — D1809 Hemangioma of other sites: Secondary | ICD-10-CM | POA: Diagnosis not present

## 2014-07-25 DIAGNOSIS — H4011X2 Primary open-angle glaucoma, moderate stage: Secondary | ICD-10-CM | POA: Diagnosis not present

## 2014-08-13 ENCOUNTER — Encounter: Payer: Self-pay | Admitting: Podiatry

## 2014-08-13 ENCOUNTER — Ambulatory Visit (INDEPENDENT_AMBULATORY_CARE_PROVIDER_SITE_OTHER): Payer: Medicare Other | Admitting: Podiatry

## 2014-08-13 VITALS — BP 182/60 | HR 77 | Resp 16

## 2014-08-13 DIAGNOSIS — L97921 Non-pressure chronic ulcer of unspecified part of left lower leg limited to breakdown of skin: Secondary | ICD-10-CM

## 2014-08-13 DIAGNOSIS — E1149 Type 2 diabetes mellitus with other diabetic neurological complication: Secondary | ICD-10-CM

## 2014-08-13 DIAGNOSIS — I739 Peripheral vascular disease, unspecified: Secondary | ICD-10-CM | POA: Diagnosis not present

## 2014-08-13 DIAGNOSIS — E1140 Type 2 diabetes mellitus with diabetic neuropathy, unspecified: Secondary | ICD-10-CM

## 2014-08-13 DIAGNOSIS — B351 Tinea unguium: Secondary | ICD-10-CM | POA: Diagnosis not present

## 2014-08-13 NOTE — Patient Instructions (Signed)
Continue daily dressing changes to the wound on your right leg. If it has not healed in 2 weeks, or if there are any problems, call the office Monitor for any signs/symptoms of infection. Call the office immediately if any occur or go directly to the emergency room. Call with any questions/concerns.

## 2014-08-14 NOTE — Progress Notes (Signed)
Patient ID: Micheal Walls, male   DOB: 02/25/1936, 79 y.o.   MRN: [id number]  Subjective: 79 y.o.-year-old male returns the office today for elongated, thickened toenails which he is unable to trim himself. States he notices his nails rubbing on his shoes. Denies any redness or drainage around the nails. He states he has noticed a small wound over the last week form on the front of his left leg. Denies any drainage or purulence from the area and denies any surrounding redness or red streaking. Denies any acute changes since last appointment and no new complaints today. Denies any systemic complaints such as fevers, chills, nausea, vomiting.   Objective: R BKA AAO 3, NAD DP/PT pulses decreased Protective sensation decreased with Simms Weinstein monofilament Nails hypertrophic, dystrophic, elongated, brittle, discolored 5. There is no surrounding erythema or drainage along the nail sites. On the anterior aspect the left distal leg there is a small superficial wound measuring 1.0 x 0.3 cm with a granular wound base. Periwound is intact. There is no surrounding erythema, ascending cellulitis, fluctuance, crepitus, drainage/purulence, malodor. No other open lesions or pre-ulcerative lesions.  No pain with calf compression, swelling, warmth, erythema.  Assessment: Patient presents with symptomatic onychomycosis, left anterior leg ulcer  Plan: -Treatment options including alternatives, risks, complications were discussed -Nails sharply debrided 5 without complication/bleeding. -For the wound on the anterior aspect the left leg recommended continues to apply a small amount of a lack ointment and a Band-Aid over that area. If the wound is not healed within 2 weeks to call the office for follow-up appointment or sooner if there is any problems. -Discussed daily foot inspection. If there are any changes, to call the office immediately.  -Follow-up in 3 months or sooner if any problems are to arise.  In the meantime, encouraged to call the office with any questions, concerns, changes symptoms.

## 2014-08-18 DIAGNOSIS — E780 Pure hypercholesterolemia: Secondary | ICD-10-CM | POA: Diagnosis not present

## 2014-08-18 DIAGNOSIS — E1165 Type 2 diabetes mellitus with hyperglycemia: Secondary | ICD-10-CM | POA: Diagnosis not present

## 2014-08-18 DIAGNOSIS — N183 Chronic kidney disease, stage 3 (moderate): Secondary | ICD-10-CM | POA: Diagnosis not present

## 2014-08-18 DIAGNOSIS — N08 Glomerular disorders in diseases classified elsewhere: Secondary | ICD-10-CM | POA: Diagnosis not present

## 2014-08-18 DIAGNOSIS — I129 Hypertensive chronic kidney disease with stage 1 through stage 4 chronic kidney disease, or unspecified chronic kidney disease: Secondary | ICD-10-CM | POA: Diagnosis not present

## 2014-08-28 DIAGNOSIS — I129 Hypertensive chronic kidney disease with stage 1 through stage 4 chronic kidney disease, or unspecified chronic kidney disease: Secondary | ICD-10-CM | POA: Diagnosis not present

## 2014-08-28 DIAGNOSIS — N3289 Other specified disorders of bladder: Secondary | ICD-10-CM | POA: Diagnosis not present

## 2014-08-28 DIAGNOSIS — N183 Chronic kidney disease, stage 3 (moderate): Secondary | ICD-10-CM | POA: Diagnosis not present

## 2014-08-28 DIAGNOSIS — M109 Gout, unspecified: Secondary | ICD-10-CM | POA: Diagnosis not present

## 2014-09-29 DIAGNOSIS — E11341 Type 2 diabetes mellitus with severe nonproliferative diabetic retinopathy with macular edema: Secondary | ICD-10-CM | POA: Diagnosis not present

## 2014-09-29 DIAGNOSIS — E11359 Type 2 diabetes mellitus with proliferative diabetic retinopathy without macular edema: Secondary | ICD-10-CM | POA: Diagnosis not present

## 2014-09-29 DIAGNOSIS — H4011X3 Primary open-angle glaucoma, severe stage: Secondary | ICD-10-CM | POA: Diagnosis not present

## 2014-11-18 DIAGNOSIS — N08 Glomerular disorders in diseases classified elsewhere: Secondary | ICD-10-CM | POA: Diagnosis not present

## 2014-11-18 DIAGNOSIS — I69059 Hemiplegia and hemiparesis following nontraumatic subarachnoid hemorrhage affecting unspecified side: Secondary | ICD-10-CM | POA: Diagnosis not present

## 2014-11-18 DIAGNOSIS — E1121 Type 2 diabetes mellitus with diabetic nephropathy: Secondary | ICD-10-CM | POA: Diagnosis not present

## 2014-11-18 DIAGNOSIS — E780 Pure hypercholesterolemia: Secondary | ICD-10-CM | POA: Diagnosis not present

## 2014-11-18 DIAGNOSIS — N183 Chronic kidney disease, stage 3 (moderate): Secondary | ICD-10-CM | POA: Diagnosis not present

## 2014-11-18 DIAGNOSIS — I129 Hypertensive chronic kidney disease with stage 1 through stage 4 chronic kidney disease, or unspecified chronic kidney disease: Secondary | ICD-10-CM | POA: Diagnosis not present

## 2014-11-19 ENCOUNTER — Ambulatory Visit (INDEPENDENT_AMBULATORY_CARE_PROVIDER_SITE_OTHER): Payer: Medicare Other | Admitting: Podiatry

## 2014-11-19 ENCOUNTER — Encounter: Payer: Self-pay | Admitting: Podiatry

## 2014-11-19 DIAGNOSIS — B353 Tinea pedis: Secondary | ICD-10-CM | POA: Diagnosis not present

## 2014-11-19 DIAGNOSIS — I739 Peripheral vascular disease, unspecified: Secondary | ICD-10-CM | POA: Diagnosis not present

## 2014-11-19 DIAGNOSIS — B351 Tinea unguium: Secondary | ICD-10-CM | POA: Diagnosis not present

## 2014-11-19 MED ORDER — ECONAZOLE NITRATE 1 % EX CREA: TOPICAL_CREAM | Freq: Every day | CUTANEOUS | Status: DC

## 2014-11-19 NOTE — Patient Instructions (Signed)
Apply and I fungal cream between the first and second toes and second and third toes daily 30 days  Diabetes and Foot Care Diabetes may cause you to have problems because of poor blood supply (circulation) to your feet and legs. This may cause the skin on your feet to become thinner, break easier, and heal more slowly. Your skin may become dry, and the skin may peel and crack. You may also have nerve damage in your legs and feet causing decreased feeling in them. You may not notice minor injuries to your feet that could lead to infections or more serious problems. Taking care of your feet is one of the most important things you can do for yourself.  HOME CARE INSTRUCTIONS  Wear shoes at all times, even in the house. Do not go barefoot. Bare feet are easily injured.  Check your feet daily for blisters, cuts, and redness. If you cannot see the bottom of your feet, use a mirror or ask someone for help.  Wash your feet with warm water (do not use hot water) and mild soap. Then pat your feet and the areas between your toes until they are completely dry. Do not soak your feet as this can dry your skin.  Apply a moisturizing lotion or petroleum jelly (that does not contain alcohol and is unscented) to the skin on your feet and to dry, brittle toenails. Do not apply lotion between your toes.  Trim your toenails straight across. Do not dig under them or around the cuticle. File the edges of your nails with an emery board or nail file.  Do not cut corns or calluses or try to remove them with medicine.  Wear clean socks or stockings every day. Make sure they are not too tight. Do not wear knee-high stockings since they may decrease blood flow to your legs.  Wear shoes that fit properly and have enough cushioning. To break in new shoes, wear them for just a few hours a day. This prevents you from injuring your feet. Always look in your shoes before you put them on to be sure there are no objects  inside.  Do not cross your legs. This may decrease the blood flow to your feet.  If you find a minor scrape, cut, or break in the skin on your feet, keep it and the skin around it clean and dry. These areas may be cleansed with mild soap and water. Do not cleanse the area with peroxide, alcohol, or iodine.  When you remove an adhesive bandage, be sure not to damage the skin around it.  If you have a wound, look at it several times a day to make sure it is healing.  Do not use heating pads or hot water bottles. They may burn your skin. If you have lost feeling in your feet or legs, you may not know it is happening until it is too late.  Make sure your health care provider performs a complete foot exam at least annually or more often if you have foot problems. Report any cuts, sores, or bruises to your health care provider immediately. SEEK MEDICAL CARE IF:   You have an injury that is not healing.  You have cuts or breaks in the skin.  You have an ingrown nail.  You notice redness on your legs or feet.  You feel burning or tingling in your legs or feet.  You have pain or cramps in your legs and feet.  Your legs or  feet are numb.  Your feet always feel cold. SEEK IMMEDIATE MEDICAL CARE IF:   There is increasing redness, swelling, or pain in or around a wound.  There is a red line that goes up your leg.  Pus is coming from a wound.  You develop a fever or as directed by your health care provider.  You notice a bad smell coming from an ulcer or wound. Document Released: 05/27/2000 Document Revised: 01/30/2013 Document Reviewed: 11/06/2012 [facility] Patient Information 2015 [place], [place]. This information is not intended to replace advice given to you by your health care provider. Make sure you discuss any questions you have with your health care provider.

## 2014-11-20 NOTE — Progress Notes (Signed)
Patient ID: Micheal Walls, male   DOB: 20-Mar-1936, 79 y.o.   MRN: [id number]   Subjective: This patient presents requesting nail debridement on the left foot. He is unaware of any new problem since the visit of 08/13/2014  Objective: BK amputation right Diminished DP and PT pulse left First and second webspace left macerated, moist without any active drainage Toenails 1-5 are elongated, incurvated, discolored  Assessment: Tinea pedis first and second webspace left (new problem) Mycotic toenails 1-5 left Peripheral arterial disease  Plan: Debridement toenails 5 left without any bleeding(associated with peripheral arterial disease) Patient advised that he has a tinea infection on the right foot and to apply and I fungal cream to the web spaces 1-2 daily 30 days  Rx econazole 1% cream dispensed 30 g tube Apply to webspace first and second toes daily 30 days

## 2014-12-31 ENCOUNTER — Encounter: Payer: Self-pay | Admitting: Cardiovascular Disease

## 2015-01-12 ENCOUNTER — Encounter: Payer: Self-pay | Admitting: Cardiovascular Disease

## 2015-02-17 DIAGNOSIS — N183 Chronic kidney disease, stage 3 (moderate): Secondary | ICD-10-CM | POA: Diagnosis not present

## 2015-02-17 DIAGNOSIS — I129 Hypertensive chronic kidney disease with stage 1 through stage 4 chronic kidney disease, or unspecified chronic kidney disease: Secondary | ICD-10-CM | POA: Diagnosis not present

## 2015-02-17 DIAGNOSIS — N08 Glomerular disorders in diseases classified elsewhere: Secondary | ICD-10-CM | POA: Diagnosis not present

## 2015-02-17 DIAGNOSIS — R635 Abnormal weight gain: Secondary | ICD-10-CM | POA: Diagnosis not present

## 2015-02-17 DIAGNOSIS — E1121 Type 2 diabetes mellitus with diabetic nephropathy: Secondary | ICD-10-CM | POA: Diagnosis not present

## 2015-02-23 ENCOUNTER — Emergency Department (HOSPITAL_COMMUNITY)
Admission: EM | Admit: 2015-02-23 | Discharge: 2015-02-23 | Disposition: A | Discharge status: Home / Self Care | Payer: Medicare Other | Attending: Emergency Medicine | Admitting: Emergency Medicine

## 2015-02-23 ENCOUNTER — Encounter (HOSPITAL_COMMUNITY): Payer: Self-pay

## 2015-02-23 DIAGNOSIS — L988 Other specified disorders of the skin and subcutaneous tissue: Secondary | ICD-10-CM | POA: Diagnosis not present

## 2015-02-23 DIAGNOSIS — G4733 Obstructive sleep apnea (adult) (pediatric): Secondary | ICD-10-CM | POA: Diagnosis not present

## 2015-02-23 DIAGNOSIS — Z89511 Acquired absence of right leg below knee: Secondary | ICD-10-CM | POA: Diagnosis not present

## 2015-02-23 DIAGNOSIS — E669 Obesity, unspecified: Secondary | ICD-10-CM | POA: Insufficient documentation

## 2015-02-23 DIAGNOSIS — Z87891 Personal history of nicotine dependence: Secondary | ICD-10-CM | POA: Diagnosis not present

## 2015-02-23 DIAGNOSIS — I5030 Unspecified diastolic (congestive) heart failure: Secondary | ICD-10-CM | POA: Diagnosis not present

## 2015-02-23 DIAGNOSIS — D649 Anemia, unspecified: Secondary | ICD-10-CM | POA: Diagnosis not present

## 2015-02-23 DIAGNOSIS — Z79899 Other long term (current) drug therapy: Secondary | ICD-10-CM | POA: Insufficient documentation

## 2015-02-23 DIAGNOSIS — Z8719 Personal history of other diseases of the digestive system: Secondary | ICD-10-CM | POA: Insufficient documentation

## 2015-02-23 DIAGNOSIS — Z7982 Long term (current) use of aspirin: Secondary | ICD-10-CM | POA: Diagnosis not present

## 2015-02-23 DIAGNOSIS — M109 Gout, unspecified: Secondary | ICD-10-CM | POA: Diagnosis not present

## 2015-02-23 DIAGNOSIS — Z794 Long term (current) use of insulin: Secondary | ICD-10-CM | POA: Diagnosis not present

## 2015-02-23 DIAGNOSIS — L989 Disorder of the skin and subcutaneous tissue, unspecified: Secondary | ICD-10-CM | POA: Diagnosis not present

## 2015-02-23 DIAGNOSIS — I10 Essential (primary) hypertension: Secondary | ICD-10-CM | POA: Insufficient documentation

## 2015-02-23 DIAGNOSIS — Z9981 Dependence on supplemental oxygen: Secondary | ICD-10-CM | POA: Insufficient documentation

## 2015-02-23 DIAGNOSIS — E785 Hyperlipidemia, unspecified: Secondary | ICD-10-CM | POA: Insufficient documentation

## 2015-02-23 NOTE — ED Notes (Signed)
Pt alert x4 respirations easy non labored.  

## 2015-02-23 NOTE — ED Provider Notes (Signed)
CSN: [id number]     Arrival date & time 02/23/15  2725 History   First MD Initiated Contact with Patient 02/23/15 1002     Chief Complaint  Patient presents with  . skin discoloration      (Consider location/radiation/quality/duration/timing/severity/associated sxs/prior Treatment) HPI..... Patient presents with skin discoloration on his right anterior thigh for the past 24 hours. Status post right BKA in year 2000. No fever, chills, tenderness, erythema, swelling to stump.  He does not feel poorly.  Past Medical History  Diagnosis Date  . Diverticulosis   . Gout   . OSA on CPAP   . Diastolic heart failure   . DM (diabetes mellitus)   . Hyperlipemia   . Systemic hypertension   . Amputated below knee     right  . Anemia   . RBBB    Past Surgical History  Procedure Laterality Date  . Leg amputation below knee      right  . Nm myocar perf wall motion  09/18/2009    no ischemia   Family History  Problem Relation Age of Onset  . Diabetes Mother   . Heart attack Father   . Cancer Sister    Social History  Substance Use Topics  . Smoking status: Former Smoker    Types: Cigars    Quit date: 06/12/2001  . Smokeless tobacco: Never Used  . Alcohol Use: No    Review of Systems  All other systems reviewed and are negative.     Allergies  Review of patient's allergies indicates no known allergies.  Home Medications   Prior to Admission medications   Medication Sig Start Date End Date Taking? Authorizing Provider  allopurinol (ZYLOPRIM) 100 MG tablet Take 100 mg by mouth daily.   Yes Historical Provider, MD  aspirin 81 MG tablet Take 162 mg by mouth daily.   Yes Historical Provider, MD  colchicine 0.6 MG tablet Take 0.6 mg by mouth as needed (gout flare up).    Yes Historical Provider, MD  diltiazem (TIAZAC) 300 MG 24 hr capsule Take 300 mg by mouth daily.   Yes Historical Provider, MD  ferrous sulfate 325 (65 FE) MG tablet Take 325 mg by mouth daily with breakfast.    Yes Historical Provider, MD  fish oil-omega-3 fatty acids 1000 MG capsule Take 2 g by mouth daily.   Yes Historical Provider, MD  furosemide (LASIX) 40 MG tablet Take 1 tablet (40 mg total) by mouth 2 (two) times daily. 03/20/13  Yes [name], MD  Insulin Aspart (NOVOLOG [place]) Inject 42-48 Units into the skin 2 (two) times daily. 48 in the am and 42 at night.   Yes Historical Provider, MD  Insulin Detemir (LEVEMIR FLEXTOUCH) 100 UNIT/ML SOPN Inject 20 Units into the skin at bedtime.   Yes Historical Provider, MD  ketotifen (ALAWAY) 0.025 % ophthalmic solution Place 1 drop into both eyes 2 (two) times daily.   Yes Historical Provider, MD  losartan (COZAAR) 50 MG tablet Take 50 mg by mouth daily.   Yes Historical Provider, MD  Multiple Vitamin (MULTIVITAMIN) tablet Take 1 tablet by mouth daily.   Yes Historical Provider, MD  niacin (NIASPAN) 500 MG CR tablet Take 1,000 mg by mouth at bedtime.   Yes Historical Provider, MD  pantoprazole (PROTONIX) 40 MG tablet Take 40 mg by mouth daily.   Yes Historical Provider, MD  Polyvinyl Alcohol-Povidone (REFRESH OP) Apply 1 drop to eye 2 (two) times daily.   Yes Historical Provider, MD  PRESCRIPTION MEDICATION Place 1 drop into both eyes 2 (two) times daily. For glucoma   Yes Historical Provider, MD  rosuvastatin (CRESTOR) 10 MG tablet Take 10 mg by mouth daily.    Yes Historical Provider, MD  cephALEXin (KEFLEX) 500 MG capsule Take 1 capsule (500 mg total) by mouth 3 (three) times daily. Patient not taking: Reported on 02/23/2015 05/07/14   Trula Slade, DPM  econazole nitrate 1 % cream Apply topically daily. Apply daily to the labs of the first and second toes 30 days Patient not taking: Reported on 02/23/2015 11/19/14   Richard C Tuchman, DPM   BP 144/53 mmHg  Pulse 74  Temp(Src) 98 F (36.7 C) (Oral)  Resp 18  SpO2 97% Physical Exam  Constitutional: He is oriented to person, place, and time. He appears well-developed and well-nourished.  Obese,  no acute distress  HENT:  Head: Normocephalic and atraumatic.  Musculoskeletal:  Right BKA  Neurological: He is alert and oriented to person, place, and time.  Skin:  Right lower extremity: Status post BKA. Skin exam shows a macular light brown discoloration on his anterior thigh. No petechiae  Psychiatric: He has a normal mood and affect. His behavior is normal.    ED Course  Procedures (including critical care time) Labs Review Labs Reviewed - No data to display  Imaging Review No results found. I have personally reviewed and evaluated these images and lab results as part of my medical decision-making.   EKG Interpretation None      MDM   Final diagnoses:  Skin abnormalities    Patient is nontoxic. There is no evidence of cellulitis. No ecchymosis or petechiae.    Nat Christen, MD 02/26/15 1230

## 2015-02-23 NOTE — Discharge Instructions (Signed)
No treatment necessary at this time. Follow-up your primary care doctor.

## 2015-02-23 NOTE — ED Notes (Signed)
Pt with rt bka from 2000.  Noticing purple splotches to upper thigh and skin tissue at stump.  Started yesterday.  No pain.  Does take daily aspirin.  No new injury

## 2015-02-25 ENCOUNTER — Ambulatory Visit (INDEPENDENT_AMBULATORY_CARE_PROVIDER_SITE_OTHER): Payer: Medicare Other | Admitting: Podiatry

## 2015-02-25 ENCOUNTER — Encounter: Payer: Self-pay | Admitting: Podiatry

## 2015-02-25 DIAGNOSIS — B351 Tinea unguium: Secondary | ICD-10-CM

## 2015-02-25 DIAGNOSIS — M79675 Pain in left toe(s): Secondary | ICD-10-CM | POA: Diagnosis not present

## 2015-02-25 DIAGNOSIS — B353 Tinea pedis: Secondary | ICD-10-CM

## 2015-02-25 MED ORDER — ECONAZOLE NITRATE 1 % EX CREA: TOPICAL_CREAM | Freq: Two times a day (BID) | CUTANEOUS | Status: DC

## 2015-02-26 NOTE — Progress Notes (Signed)
Patient ID: SOLOMON SKOWRONEK, male   DOB: [birth year], 79 y.o.   MRN: [id number]  Subjective: This patient presents for scheduled visit of nail debridement on the left foot. On the visit of 11/19/2014 econazole cream was prescribed to apply to the web spaces daily for 30 days. Patient said he did use his cream on a daily basis  Objective: Mild residual scaling and maceration is first and second fourth left web spaces. Toenails 1-5 are elongated, brittle, incurvated BK amputation right  Assessment: Improving but residual tinea pedis webspaces left Mycotic toenails 1-5 left Peripheral arterial disease  Plan: Debridement toenails 5 mechanically and electrically without any bleeding Refill econazole 1% cream applied twice a day webspaces on the left foot  Reappoint 3 month

## 2015-03-10 DIAGNOSIS — N3289 Other specified disorders of bladder: Secondary | ICD-10-CM | POA: Diagnosis not present

## 2015-03-10 DIAGNOSIS — N183 Chronic kidney disease, stage 3 (moderate): Secondary | ICD-10-CM | POA: Diagnosis not present

## 2015-03-10 DIAGNOSIS — E118 Type 2 diabetes mellitus with unspecified complications: Secondary | ICD-10-CM | POA: Diagnosis not present

## 2015-03-10 DIAGNOSIS — I129 Hypertensive chronic kidney disease with stage 1 through stage 4 chronic kidney disease, or unspecified chronic kidney disease: Secondary | ICD-10-CM | POA: Diagnosis not present

## 2015-03-30 DIAGNOSIS — H35043 Retinal micro-aneurysms, unspecified, bilateral: Secondary | ICD-10-CM | POA: Diagnosis not present

## 2015-03-30 DIAGNOSIS — E113493 Type 2 diabetes mellitus with severe nonproliferative diabetic retinopathy without macular edema, bilateral: Secondary | ICD-10-CM | POA: Diagnosis not present

## 2015-03-30 DIAGNOSIS — H43813 Vitreous degeneration, bilateral: Secondary | ICD-10-CM | POA: Diagnosis not present

## 2015-05-14 DIAGNOSIS — K649 Unspecified hemorrhoids: Secondary | ICD-10-CM | POA: Diagnosis not present

## 2015-05-14 DIAGNOSIS — N08 Glomerular disorders in diseases classified elsewhere: Secondary | ICD-10-CM | POA: Diagnosis not present

## 2015-05-14 DIAGNOSIS — N183 Chronic kidney disease, stage 3 (moderate): Secondary | ICD-10-CM | POA: Diagnosis not present

## 2015-05-14 DIAGNOSIS — E1121 Type 2 diabetes mellitus with diabetic nephropathy: Secondary | ICD-10-CM | POA: Diagnosis not present

## 2015-05-27 ENCOUNTER — Ambulatory Visit (INDEPENDENT_AMBULATORY_CARE_PROVIDER_SITE_OTHER): Payer: Medicare Other | Admitting: Podiatry

## 2015-05-27 ENCOUNTER — Encounter: Payer: Self-pay | Admitting: Podiatry

## 2015-05-27 DIAGNOSIS — B351 Tinea unguium: Secondary | ICD-10-CM

## 2015-05-27 DIAGNOSIS — M79675 Pain in left toe(s): Secondary | ICD-10-CM

## 2015-05-27 NOTE — Patient Instructions (Signed)
Diabetes and Foot Care Diabetes may cause you to have problems because of poor blood supply (circulation) to your feet and legs. This may cause the skin on your feet to become thinner, break easier, and heal more slowly. Your skin may become dry, and the skin may peel and crack. You may also have nerve damage in your legs and feet causing decreased feeling in them. You may not notice minor injuries to your feet that could lead to infections or more serious problems. Taking care of your feet is one of the most important things you can do for yourself.  HOME CARE INSTRUCTIONS  Wear shoes at all times, even in the house. Do not go barefoot. Bare feet are easily injured.  Check your feet daily for blisters, cuts, and redness. If you cannot see the bottom of your feet, use a mirror or ask someone for help.  Wash your feet with warm water (do not use hot water) and mild soap. Then pat your feet and the areas between your toes until they are completely dry. Do not soak your feet as this can dry your skin.  Apply a moisturizing lotion or petroleum jelly (that does not contain alcohol and is unscented) to the skin on your feet and to dry, brittle toenails. Do not apply lotion between your toes.  Trim your toenails straight across. Do not dig under them or around the cuticle. File the edges of your nails with an emery board or nail file.  Do not cut corns or calluses or try to remove them with medicine.  Wear clean socks or stockings every day. Make sure they are not too tight. Do not wear knee-high stockings since they may decrease blood flow to your legs.  Wear shoes that fit properly and have enough cushioning. To break in new shoes, wear them for just a few hours a day. This prevents you from injuring your feet. Always look in your shoes before you put them on to be sure there are no objects inside.  Do not cross your legs. This may decrease the blood flow to your feet.  If you find a minor scrape,  cut, or break in the skin on your feet, keep it and the skin around it clean and dry. These areas may be cleansed with mild soap and water. Do not cleanse the area with peroxide, alcohol, or iodine.  When you remove an adhesive bandage, be sure not to damage the skin around it.  If you have a wound, look at it several times a day to make sure it is healing.  Do not use heating pads or hot water bottles. They may burn your skin. If you have lost feeling in your feet or legs, you may not know it is happening until it is too late.  Make sure your health care provider performs a complete foot exam at least annually or more often if you have foot problems. Report any cuts, sores, or bruises to your health care provider immediately. SEEK MEDICAL CARE IF:   You have an injury that is not healing.  You have cuts or breaks in the skin.  You have an ingrown nail.  You notice redness on your legs or feet.  You feel burning or tingling in your legs or feet.  You have pain or cramps in your legs and feet.  Your legs or feet are numb.  Your feet always feel cold. SEEK IMMEDIATE MEDICAL CARE IF:   There is increasing redness,   swelling, or pain in or around a wound.  There is a red line that goes up your leg.  Pus is coming from a wound.  You develop a fever or as directed by your health care provider.  You notice a bad smell coming from an ulcer or wound.   This information is not intended to replace advice given to you by your health care provider. Make sure you discuss any questions you have with your health care provider.   Document Released: 05/27/2000 Document Revised: 01/30/2013 Document Reviewed: 11/06/2012 [employer] Interactive Patient Education 2016 [employer]  

## 2015-05-28 NOTE — Progress Notes (Signed)
Patient ID: Micheal Walls, male   DOB: [birth year], 79 y.o.   MRN: [id number]   Subjective: This patient presents again for a scheduled visit complaining of thickened and elongated toenails that are uncomfortable with shoe wearing walking and request nail debridement  Objective: BK amputation right Toenails 1-5 right are elongated, brittle, deformed No open skin lesions right Resolve skin lesion fourth and second right webspace  Assessment: Mycotic toenails 5 left Peripheral arterial disease Diabetic  Plan: Debrided toenails 5 mechanically electrically without any bleeding  Reappoint 3 months

## 2015-07-03 DIAGNOSIS — G4733 Obstructive sleep apnea (adult) (pediatric): Secondary | ICD-10-CM | POA: Diagnosis not present

## 2015-07-03 DIAGNOSIS — K029 Dental caries, unspecified: Secondary | ICD-10-CM | POA: Diagnosis not present

## 2015-07-29 DIAGNOSIS — H35043 Retinal micro-aneurysms, unspecified, bilateral: Secondary | ICD-10-CM | POA: Diagnosis not present

## 2015-07-29 DIAGNOSIS — E113412 Type 2 diabetes mellitus with severe nonproliferative diabetic retinopathy with macular edema, left eye: Secondary | ICD-10-CM | POA: Diagnosis not present

## 2015-07-29 DIAGNOSIS — H35372 Puckering of macula, left eye: Secondary | ICD-10-CM | POA: Diagnosis not present

## 2015-07-29 DIAGNOSIS — E113411 Type 2 diabetes mellitus with severe nonproliferative diabetic retinopathy with macular edema, right eye: Secondary | ICD-10-CM | POA: Diagnosis not present

## 2015-08-12 DIAGNOSIS — E113412 Type 2 diabetes mellitus with severe nonproliferative diabetic retinopathy with macular edema, left eye: Secondary | ICD-10-CM | POA: Diagnosis not present

## 2015-08-13 DIAGNOSIS — I129 Hypertensive chronic kidney disease with stage 1 through stage 4 chronic kidney disease, or unspecified chronic kidney disease: Secondary | ICD-10-CM | POA: Diagnosis not present

## 2015-08-13 DIAGNOSIS — G4733 Obstructive sleep apnea (adult) (pediatric): Secondary | ICD-10-CM | POA: Diagnosis not present

## 2015-08-13 DIAGNOSIS — E1121 Type 2 diabetes mellitus with diabetic nephropathy: Secondary | ICD-10-CM | POA: Diagnosis not present

## 2015-08-13 DIAGNOSIS — N183 Chronic kidney disease, stage 3 (moderate): Secondary | ICD-10-CM | POA: Diagnosis not present

## 2015-08-13 DIAGNOSIS — Z79899 Other long term (current) drug therapy: Secondary | ICD-10-CM | POA: Diagnosis not present

## 2015-08-20 DIAGNOSIS — E113411 Type 2 diabetes mellitus with severe nonproliferative diabetic retinopathy with macular edema, right eye: Secondary | ICD-10-CM | POA: Diagnosis not present

## 2015-08-26 ENCOUNTER — Ambulatory Visit: Payer: Medicare Other | Admitting: Podiatry

## 2015-08-28 DIAGNOSIS — M109 Gout, unspecified: Secondary | ICD-10-CM | POA: Diagnosis not present

## 2015-08-28 DIAGNOSIS — N3289 Other specified disorders of bladder: Secondary | ICD-10-CM | POA: Diagnosis not present

## 2015-08-28 DIAGNOSIS — I129 Hypertensive chronic kidney disease with stage 1 through stage 4 chronic kidney disease, or unspecified chronic kidney disease: Secondary | ICD-10-CM | POA: Diagnosis not present

## 2015-08-28 DIAGNOSIS — N183 Chronic kidney disease, stage 3 (moderate): Secondary | ICD-10-CM | POA: Diagnosis not present

## 2015-08-28 DIAGNOSIS — E118 Type 2 diabetes mellitus with unspecified complications: Secondary | ICD-10-CM | POA: Diagnosis not present

## 2015-08-28 DIAGNOSIS — E785 Hyperlipidemia, unspecified: Secondary | ICD-10-CM | POA: Diagnosis not present

## 2015-09-16 ENCOUNTER — Ambulatory Visit (INDEPENDENT_AMBULATORY_CARE_PROVIDER_SITE_OTHER): Payer: Medicare Other | Admitting: Podiatry

## 2015-09-16 ENCOUNTER — Encounter: Payer: Self-pay | Admitting: Podiatry

## 2015-09-16 DIAGNOSIS — B351 Tinea unguium: Secondary | ICD-10-CM

## 2015-09-16 DIAGNOSIS — M79675 Pain in left toe(s): Secondary | ICD-10-CM

## 2015-09-16 NOTE — Patient Instructions (Signed)
Diabetes and Foot Care Diabetes may cause you to have problems because of poor blood supply (circulation) to your feet and legs. This may cause the skin on your feet to become thinner, break easier, and heal more slowly. Your skin may become dry, and the skin may peel and crack. You may also have nerve damage in your legs and feet causing decreased feeling in them. You may not notice minor injuries to your feet that could lead to infections or more serious problems. Taking care of your feet is one of the most important things you can do for yourself.  HOME CARE INSTRUCTIONS  Wear shoes at all times, even in the house. Do not go barefoot. Bare feet are easily injured.  Check your feet daily for blisters, cuts, and redness. If you cannot see the bottom of your feet, use a mirror or ask someone for help.  Wash your feet with warm water (do not use hot water) and mild soap. Then pat your feet and the areas between your toes until they are completely dry. Do not soak your feet as this can dry your skin.  Apply a moisturizing lotion or petroleum jelly (that does not contain alcohol and is unscented) to the skin on your feet and to dry, brittle toenails. Do not apply lotion between your toes.  Trim your toenails straight across. Do not dig under them or around the cuticle. File the edges of your nails with an emery board or nail file.  Do not cut corns or calluses or try to remove them with medicine.  Wear clean socks or stockings every day. Make sure they are not too tight. Do not wear knee-high stockings since they may decrease blood flow to your legs.  Wear shoes that fit properly and have enough cushioning. To break in new shoes, wear them for just a few hours a day. This prevents you from injuring your feet. Always look in your shoes before you put them on to be sure there are no objects inside.  Do not cross your legs. This may decrease the blood flow to your feet.  If you find a minor scrape,  cut, or break in the skin on your feet, keep it and the skin around it clean and dry. These areas may be cleansed with mild soap and water. Do not cleanse the area with peroxide, alcohol, or iodine.  When you remove an adhesive bandage, be sure not to damage the skin around it.  If you have a wound, look at it several times a day to make sure it is healing.  Do not use heating pads or hot water bottles. They may burn your skin. If you have lost feeling in your feet or legs, you may not know it is happening until it is too late.  Make sure your health care provider performs a complete foot exam at least annually or more often if you have foot problems. Report any cuts, sores, or bruises to your health care provider immediately. SEEK MEDICAL CARE IF:   You have an injury that is not healing.  You have cuts or breaks in the skin.  You have an ingrown nail.  You notice redness on your legs or feet.  You feel burning or tingling in your legs or feet.  You have pain or cramps in your legs and feet.  Your legs or feet are numb.  Your feet always feel cold. SEEK IMMEDIATE MEDICAL CARE IF:   There is increasing redness,   swelling, or pain in or around a wound.  There is a red line that goes up your leg.  Pus is coming from a wound.  You develop a fever or as directed by your health care provider.  You notice a bad smell coming from an ulcer or wound.   This information is not intended to replace advice given to you by your health care provider. Make sure you discuss any questions you have with your health care provider.   Document Released: 05/27/2000 Document Revised: 01/30/2013 Document Reviewed: 11/06/2012 [employer] Interactive Patient Education 2016 [employer]  

## 2015-09-17 ENCOUNTER — Encounter: Payer: Self-pay | Admitting: Cardiovascular Disease

## 2015-09-17 ENCOUNTER — Ambulatory Visit (INDEPENDENT_AMBULATORY_CARE_PROVIDER_SITE_OTHER): Payer: Medicare Other | Admitting: Cardiovascular Disease

## 2015-09-17 VITALS — BP 130/60 | HR 82 | Ht 71.0 in | Wt 264.4 lb

## 2015-09-17 DIAGNOSIS — N183 Chronic kidney disease, stage 3 unspecified: Secondary | ICD-10-CM

## 2015-09-17 DIAGNOSIS — G4733 Obstructive sleep apnea (adult) (pediatric): Secondary | ICD-10-CM | POA: Diagnosis not present

## 2015-09-17 DIAGNOSIS — E1051 Type 1 diabetes mellitus with diabetic peripheral angiopathy without gangrene: Secondary | ICD-10-CM

## 2015-09-17 DIAGNOSIS — I10 Essential (primary) hypertension: Secondary | ICD-10-CM | POA: Diagnosis not present

## 2015-09-17 DIAGNOSIS — Z6835 Body mass index (BMI) 35.0-35.9, adult: Secondary | ICD-10-CM

## 2015-09-17 DIAGNOSIS — E1065 Type 1 diabetes mellitus with hyperglycemia: Secondary | ICD-10-CM

## 2015-09-17 DIAGNOSIS — Z9989 Dependence on other enabling machines and devices: Secondary | ICD-10-CM

## 2015-09-17 DIAGNOSIS — Z89511 Acquired absence of right leg below knee: Secondary | ICD-10-CM

## 2015-09-17 DIAGNOSIS — IMO0002 Reserved for concepts with insufficient information to code with codable children: Secondary | ICD-10-CM

## 2015-09-17 DIAGNOSIS — E785 Hyperlipidemia, unspecified: Secondary | ICD-10-CM

## 2015-09-17 NOTE — Progress Notes (Signed)
Patient ID: Micheal Walls, male   DOB: [birth year], 80 y.o.   MRN: [id number]   Subjective: This patient presents again for a scheduled visit complaining of thickened and elongated toenails that are uncomfortable with shoe wearing walking and request nail debridement  Objective: BK amputation right Toenails 1-5 right are elongated, brittle, deformed No open skin lesions right Resolve skin lesion fourth and second right webspace  DP and PT pulses 1/l4eft  Patient 10 g monofilament wire 0/5 ref Vibratory sensation nonreactive left Ankle reflex is weak reactive left    Assessment: Mycotic toenails 5 left Peripheral arterial disease Peripheral neuropathy Diabetic  Plan: Debrided toenails 5 mechanically electrically without any bleeding  Reappoint 3 months

## 2015-09-17 NOTE — Patient Instructions (Signed)
Your physician recommends that you continue on your current medications as directed. Please refer to the Current Medication list given to you today.  Dr [name] recommends that you schedule a follow-up appointment in 1 year. You will receive a reminder letter in the mail two months in advance. If you don't receive a letter, please call our office to schedule the follow-up appointment.  If you need a refill on your cardiac medications before your next appointment, please call your pharmacy. 

## 2015-09-17 NOTE — Progress Notes (Signed)
Patient ID: Micheal Walls, male   DOB: 1936-02-25, 80 y.o.   MRN: [id number]    Cardiology Office Note    Date:  09/17/2015   ID:  Micheal Walls, DOB 09-27-1935, MRN [id number]  PCP:  Maximino Greenland, MD  Cardiologist:   [name], MD   Chief Complaint  Patient presents with  . Follow-up    no chest pain, no shortness or breath, edema, no pain cramping in legs, has cramping in hands, no lightheaded or dizziness    History of Present Illness:  Micheal Walls is a 80 y.o. male with a history of previous stroke, right below the knee amputation for diabetes-related peripheral arterial disease, insulin-requiring type 2 diabetes mellitus, hyperlipidemia, hypertension and chronic kidney disease as well as obstructive sleep apnea on chronic CPAP who returns for routine follow-up.  I'm not sure why, but his previous durable medical equipment provider is no longer helping with his CPAP. His most recent sleep study was performed in 2011 and confirmed sleep apnea. Will try to find a new equipment provider since he states he needs new tubing, mask and filters.  He reports labs performed with Dr. Baird Cancer not long ago showing that his glucose control lipids were "okay".  He denies angina and dyspnea, but is of course very sedentary. He has not had palpitations, syncope, leg edema or pain in his left leg. He wears a prosthesis on the right side. He has not had any new focal neurological problems. He denies daytime hypersomnolence.  He has never had a cardiac catheterization. By noninvasive imaging has no evidence of nuclear perfusion abnormalities and has a normal left ventricular systolic function. Heart failure has not been an issue to date. He has chronic problems with iron deficiency anemia and has previously undergone upper endoscopy, colonoscopy and capsule enteroscopy without a clear source of bleeding other than some degree of right-sided colonic diverticulosis.   Past Medical History   Diagnosis Date  . Diverticulosis   . Gout   . OSA on CPAP   . Diastolic heart failure ([place])   . DM (diabetes mellitus) ([place])   . Hyperlipemia   . Systemic hypertension   . Amputated below knee ([place])     right  . Anemia   . RBBB     Past Surgical History  Procedure Laterality Date  . Leg amputation below knee      right  . Nm myocar perf wall motion  09/18/2009    no ischemia    Current Medications: Outpatient Prescriptions Prior to Visit  Medication Sig Dispense Refill  . allopurinol (ZYLOPRIM) 100 MG tablet Take 100 mg by mouth daily.    Marland Kitchen aspirin 81 MG tablet Take 162 mg by mouth daily.    . cephALEXin (KEFLEX) 500 MG capsule Take 1 capsule (500 mg total) by mouth 3 (three) times daily. 30 capsule 2  . colchicine 0.6 MG tablet Take 0.6 mg by mouth as needed (gout flare up).     . diltiazem (TIAZAC) 300 MG 24 hr capsule Take 300 mg by mouth daily.    Marland Kitchen econazole nitrate 1 % cream Apply topically 2 (two) times daily. Apply twice daily to web spaces toes 30 days 30 g 0  . ferrous sulfate 325 (65 FE) MG tablet Take 325 mg by mouth daily with breakfast.    . fish oil-omega-3 fatty acids 1000 MG capsule Take 2 g by mouth daily.    . furosemide (LASIX) 40 MG tablet Take 1 tablet (  40 mg total) by mouth 2 (two) times daily. 180 tablet 3  . Insulin Aspart (NOVOLOG [place]) Inject 42-48 Units into the skin 2 (two) times daily. 48 in the am and 42 at night.    . Insulin Detemir (LEVEMIR FLEXTOUCH) 100 UNIT/ML SOPN Inject 20 Units into the skin at bedtime.    Marland Kitchen ketotifen (ALAWAY) 0.025 % ophthalmic solution Place 1 drop into both eyes 2 (two) times daily.    . Multiple Vitamin (MULTIVITAMIN) tablet Take 1 tablet by mouth daily.    . niacin (NIASPAN) 500 MG CR tablet Take 1,000 mg by mouth at bedtime.    . pantoprazole (PROTONIX) 40 MG tablet Take 40 mg by mouth daily.    . Polyvinyl Alcohol-Povidone (REFRESH OP) Apply 1 drop to eye 2 (two) times daily.    Marland Kitchen PRESCRIPTION MEDICATION Place 1  drop into both eyes 2 (two) times daily. For glucoma    . rosuvastatin (CRESTOR) 10 MG tablet Take 10 mg by mouth daily.     Marland Kitchen losartan (COZAAR) 50 MG tablet Take 50 mg by mouth daily.     No facility-administered medications prior to visit.     Allergies:   Review of patient's allergies indicates no known allergies.   Social History   Social History  . Marital Status: Married    Spouse Name: N/A  . Number of Children: N/A  . Years of Education: N/A   Social History Main Topics  . Smoking status: Former Smoker    Types: Cigars    Quit date: 06/12/2001  . Smokeless tobacco: Never Used  . Alcohol Use: No  . Drug Use: No  . Sexual Activity: Not Asked   Other Topics Concern  . None   Social History Narrative     Family History:  The patient's family history includes Cancer in his sister; Diabetes in his mother; Heart attack in his father.   ROS:   Please see the history of present illness.    ROS All other systems reviewed and are negative.   PHYSICAL EXAM:   VS:  BP 130/60 mmHg  Pulse 82  Ht $R'5\' 11"'YR$  (1.803 m)  Wt 119.92 kg (264 lb 6 oz)  BMI 36.89 kg/m2   General: Alert, oriented x3, no distress Head: no evidence of trauma, PERRL, EOMI, no exophtalmos or lid lag, no myxedema, no xanthelasma; normal ears, nose and oropharynx Neck: normal jugular venous pulsations and no hepatojugular reflux; brisk carotid pulses without delay and no carotid bruits Chest: clear to auscultation, no signs of consolidation by percussion or palpation, normal fremitus, symmetrical and full respiratory excursions Cardiovascular: normal position and quality of the apical impulse, regular rhythm, normal first and widely split second heart sounds, no murmurs, rubs or gallops Abdomen: no tenderness or distention, no masses by palpation, no abnormal pulsatility or arterial bruits, normal bowel sounds, no hepatosplenomegaly Extremities: Status post right below-the-knee amputation; no clubbing,  cyanosis or edema; 2+ radial, ulnar and brachial pulses bilaterally; 2+ right femoral, pulses; 2+ left femoral, posterior tibial and dorsalis pedis pulses; no subclavian or femoral bruits Neurological: grossly nonfocal  Wt Readings from Last 3 Encounters:  09/17/15 119.92 kg (264 lb 6 oz)  07/22/13 129.275 kg (285 lb)  03/14/13 129.865 kg (286 lb 4.8 oz)      Studies/Labs Reviewed:   EKG:  EKG is ordered today.  The ekg ordered today demonstrates Sinus rhythm with first-degree AV block and right bundle branch block (old)  Recent Labs: No results found  for requested labs within last 365 days.   Lipid Panel No results found for: CHOL, TRIG, HDL, CHOLHDL, VLDL, LDLCALC, LDLDIRECT   ASSESSMENT:    1. Essential hypertension   2. OSA on CPAP   3. Uncontrolled type 1 diabetes mellitus with peripheral vascular disease ([place])   4. CKD (chronic kidney disease) stage 3, GFR 30-59 ml/min   5. Severe obesity (BMI 35.0-39.9) with comorbidity ([place])   6. Hyperlipidemia   7. S/P unilateral BKA (below knee amputation), right ([place])      PLAN:  In order of problems listed above:  1. HTN: Well-controlled 2. OSA: Need to switch him from his previous durable medical equipment provider to [facility]. Try to submit the necessary paperwork today. 3. DM: Need to get hemoglobin A1c report from Dr. Baird Cancer 4. CKD: Followed by Dr. Moshe Cipro 5. Obesity: He has made no progress with weight loss 6. HLP: Labs from Dr. Baird Cancer 7. PAD s/p R BKA   Medication Adjustments/Labs and Tests Ordered: Current medicines are reviewed at length with the patient today.  Concerns regarding medicines are outlined above.  Medication changes, Labs and Tests ordered today are listed in the Patient Instructions below. Patient Instructions  Your physician recommends that you continue on your current medications as directed. Please refer to the Current Medication list given to you today.  Dr Sallyanne Kuster recommends  that you schedule a follow-up appointment in 1 year. You will receive a reminder letter in the mail two months in advance. If you don't receive a letter, please call our office to schedule the follow-up appointment.  If you need a refill on your cardiac medications before your next appointment, please call your pharmacy.    Mikael Spray, MD  09/17/2015 10:01 AM    [facility] [place], [place], [place]  [address] Phone: [phone]; Fax: [phone]

## 2015-09-21 ENCOUNTER — Other Ambulatory Visit: Payer: Self-pay | Admitting: *Deleted

## 2015-09-21 DIAGNOSIS — G4733 Obstructive sleep apnea (adult) (pediatric): Secondary | ICD-10-CM

## 2015-09-21 DIAGNOSIS — Z9989 Dependence on other enabling machines and devices: Principal | ICD-10-CM

## 2015-09-28 ENCOUNTER — Telehealth: Payer: Self-pay | Admitting: Cardiovascular Disease

## 2015-09-28 NOTE — Telephone Encounter (Signed)
New message     patient calling checking on CPAP orders.

## 2015-09-28 NOTE — Telephone Encounter (Signed)
SPOKE TO PATIENT  INFORMED PATIENT ( SPOKE TO Micheal Walls) STILL WORKING ON GETTING EQUIPMENT  FOR CPAP  PATIENT AWARE AND VERBALIZED UNDERSTANDING.

## 2015-09-30 ENCOUNTER — Telehealth: Payer: Self-pay | Admitting: *Deleted

## 2015-09-30 NOTE — Telephone Encounter (Signed)
Called patient to give him update on his CPAP supply request. I informed him that i will need to get his paper chart if the needed notes are not in the old computer system. If the notes are not available then he will need to have another sleep study.  Patient voiced his understanding of the issue with him getting supplies and is not against having another sleep study if needed. Records and/or chart has been requested from medical records.

## 2015-11-18 DIAGNOSIS — R202 Paresthesia of skin: Secondary | ICD-10-CM | POA: Diagnosis not present

## 2015-11-18 DIAGNOSIS — E1121 Type 2 diabetes mellitus with diabetic nephropathy: Secondary | ICD-10-CM | POA: Diagnosis not present

## 2015-11-18 DIAGNOSIS — N183 Chronic kidney disease, stage 3 (moderate): Secondary | ICD-10-CM | POA: Diagnosis not present

## 2015-11-18 DIAGNOSIS — I129 Hypertensive chronic kidney disease with stage 1 through stage 4 chronic kidney disease, or unspecified chronic kidney disease: Secondary | ICD-10-CM | POA: Diagnosis not present

## 2015-12-17 DIAGNOSIS — H35372 Puckering of macula, left eye: Secondary | ICD-10-CM | POA: Diagnosis not present

## 2015-12-17 DIAGNOSIS — H35361 Drusen (degenerative) of macula, right eye: Secondary | ICD-10-CM | POA: Diagnosis not present

## 2015-12-17 DIAGNOSIS — H35043 Retinal micro-aneurysms, unspecified, bilateral: Secondary | ICD-10-CM | POA: Diagnosis not present

## 2015-12-17 DIAGNOSIS — E113411 Type 2 diabetes mellitus with severe nonproliferative diabetic retinopathy with macular edema, right eye: Secondary | ICD-10-CM | POA: Diagnosis not present

## 2015-12-23 ENCOUNTER — Ambulatory Visit (INDEPENDENT_AMBULATORY_CARE_PROVIDER_SITE_OTHER): Payer: Medicare Other | Admitting: Podiatry

## 2015-12-23 ENCOUNTER — Encounter: Payer: Self-pay | Admitting: Podiatry

## 2015-12-23 DIAGNOSIS — M79675 Pain in left toe(s): Secondary | ICD-10-CM | POA: Diagnosis not present

## 2015-12-23 DIAGNOSIS — B351 Tinea unguium: Secondary | ICD-10-CM | POA: Diagnosis not present

## 2015-12-23 NOTE — Patient Instructions (Signed)
Diabetes and Foot Care Diabetes may cause you to have problems because of poor blood supply (circulation) to your feet and legs. This may cause the skin on your feet to become thinner, break easier, and heal more slowly. Your skin may become dry, and the skin may peel and crack. You may also have nerve damage in your legs and feet causing decreased feeling in them. You may not notice minor injuries to your feet that could lead to infections or more serious problems. Taking care of your feet is one of the most important things you can do for yourself.  HOME CARE INSTRUCTIONS  Wear shoes at all times, even in the house. Do not go barefoot. Bare feet are easily injured.  Check your feet daily for blisters, cuts, and redness. If you cannot see the bottom of your feet, use a mirror or ask someone for help.  Wash your feet with warm water (do not use hot water) and mild soap. Then pat your feet and the areas between your toes until they are completely dry. Do not soak your feet as this can dry your skin.  Apply a moisturizing lotion or petroleum jelly (that does not contain alcohol and is unscented) to the skin on your feet and to dry, brittle toenails. Do not apply lotion between your toes.  Trim your toenails straight across. Do not dig under them or around the cuticle. File the edges of your nails with an emery board or nail file.  Do not cut corns or calluses or try to remove them with medicine.  Wear clean socks or stockings every day. Make sure they are not too tight. Do not wear knee-high stockings since they may decrease blood flow to your legs.  Wear shoes that fit properly and have enough cushioning. To break in new shoes, wear them for just a few hours a day. This prevents you from injuring your feet. Always look in your shoes before you put them on to be sure there are no objects inside.  Do not cross your legs. This may decrease the blood flow to your feet.  If you find a minor scrape,  cut, or break in the skin on your feet, keep it and the skin around it clean and dry. These areas may be cleansed with mild soap and water. Do not cleanse the area with peroxide, alcohol, or iodine.  When you remove an adhesive bandage, be sure not to damage the skin around it.  If you have a wound, look at it several times a day to make sure it is healing.  Do not use heating pads or hot water bottles. They may burn your skin. If you have lost feeling in your feet or legs, you may not know it is happening until it is too late.  Make sure your health care provider performs a complete foot exam at least annually or more often if you have foot problems. Report any cuts, sores, or bruises to your health care provider immediately. SEEK MEDICAL CARE IF:   You have an injury that is not healing.  You have cuts or breaks in the skin.  You have an ingrown nail.  You notice redness on your legs or feet.  You feel burning or tingling in your legs or feet.  You have pain or cramps in your legs and feet.  Your legs or feet are numb.  Your feet always feel cold. SEEK IMMEDIATE MEDICAL CARE IF:   There is increasing redness,   swelling, or pain in or around a wound.  There is a red line that goes up your leg.  Pus is coming from a wound.  You develop a fever or as directed by your health care provider.  You notice a bad smell coming from an ulcer or wound.   This information is not intended to replace advice given to you by your health care provider. Make sure you discuss any questions you have with your health care provider.   Document Released: 05/27/2000 Document Revised: 01/30/2013 Document Reviewed: 11/06/2012 [employer] Interactive Patient Education 2016 [employer]  

## 2015-12-23 NOTE — Progress Notes (Signed)
Patient ID: Micheal Walls, male   DOB: [birth year], 80 y.o.   MRN: [id number]  Subjective: This patient presents again for a scheduled visit complaining of thickened and elongated toenails that are uncomfortable with shoe wearing walking and request nail debridement  Objective: Orientated 3 Patient transfers from motorized chair to treatment table BK amputation right Toenails 1-5 right are elongated, brittle, deformed No open skin lesions right Resolve skin lesion fourth and second right webspace DP and PT pulses 1/l4eft  Patient 10 g monofilament wire 0/5 ref Vibratory sensation nonreactive left Ankle reflex is weak reactive left    Assessment: Mycotic toenails 5 left Peripheral arterial disease Peripheral neuropathy Diabetic  Plan: Debrided toenails 5 mechanically electrically without any bleeding  Reappoint 3 months

## 2016-01-21 DIAGNOSIS — H401112 Primary open-angle glaucoma, right eye, moderate stage: Secondary | ICD-10-CM | POA: Diagnosis not present

## 2016-01-21 DIAGNOSIS — E113492 Type 2 diabetes mellitus with severe nonproliferative diabetic retinopathy without macular edema, left eye: Secondary | ICD-10-CM | POA: Diagnosis not present

## 2016-01-21 DIAGNOSIS — H401122 Primary open-angle glaucoma, left eye, moderate stage: Secondary | ICD-10-CM | POA: Diagnosis not present

## 2016-01-21 DIAGNOSIS — E113491 Type 2 diabetes mellitus with severe nonproliferative diabetic retinopathy without macular edema, right eye: Secondary | ICD-10-CM | POA: Diagnosis not present

## 2016-02-22 DIAGNOSIS — Z79899 Other long term (current) drug therapy: Secondary | ICD-10-CM | POA: Diagnosis not present

## 2016-02-22 DIAGNOSIS — E1121 Type 2 diabetes mellitus with diabetic nephropathy: Secondary | ICD-10-CM | POA: Diagnosis not present

## 2016-02-22 DIAGNOSIS — G4733 Obstructive sleep apnea (adult) (pediatric): Secondary | ICD-10-CM | POA: Diagnosis not present

## 2016-02-22 DIAGNOSIS — N183 Chronic kidney disease, stage 3 (moderate): Secondary | ICD-10-CM | POA: Diagnosis not present

## 2016-02-22 DIAGNOSIS — N08 Glomerular disorders in diseases classified elsewhere: Secondary | ICD-10-CM | POA: Diagnosis not present

## 2016-02-22 DIAGNOSIS — I129 Hypertensive chronic kidney disease with stage 1 through stage 4 chronic kidney disease, or unspecified chronic kidney disease: Secondary | ICD-10-CM | POA: Diagnosis not present

## 2016-02-22 DIAGNOSIS — Z0000 Encounter for general adult medical examination without abnormal findings: Secondary | ICD-10-CM | POA: Diagnosis not present

## 2016-03-03 DIAGNOSIS — M109 Gout, unspecified: Secondary | ICD-10-CM | POA: Diagnosis not present

## 2016-03-03 DIAGNOSIS — I129 Hypertensive chronic kidney disease with stage 1 through stage 4 chronic kidney disease, or unspecified chronic kidney disease: Secondary | ICD-10-CM | POA: Diagnosis not present

## 2016-03-03 DIAGNOSIS — N3289 Other specified disorders of bladder: Secondary | ICD-10-CM | POA: Diagnosis not present

## 2016-03-03 DIAGNOSIS — E118 Type 2 diabetes mellitus with unspecified complications: Secondary | ICD-10-CM | POA: Diagnosis not present

## 2016-03-03 DIAGNOSIS — N183 Chronic kidney disease, stage 3 (moderate): Secondary | ICD-10-CM | POA: Diagnosis not present

## 2016-03-03 DIAGNOSIS — Z6841 Body Mass Index (BMI) 40.0 and over, adult: Secondary | ICD-10-CM | POA: Diagnosis not present

## 2016-03-03 DIAGNOSIS — E785 Hyperlipidemia, unspecified: Secondary | ICD-10-CM | POA: Diagnosis not present

## 2016-03-29 ENCOUNTER — Encounter: Payer: Self-pay | Admitting: Podiatry

## 2016-03-29 ENCOUNTER — Ambulatory Visit (INDEPENDENT_AMBULATORY_CARE_PROVIDER_SITE_OTHER): Payer: Medicare Other | Admitting: Podiatry

## 2016-03-29 VITALS — BP 196/80 | HR 82 | Resp 16

## 2016-03-29 DIAGNOSIS — M79675 Pain in left toe(s): Secondary | ICD-10-CM | POA: Diagnosis not present

## 2016-03-29 DIAGNOSIS — B351 Tinea unguium: Secondary | ICD-10-CM

## 2016-03-29 NOTE — Progress Notes (Signed)
Patient ID: Micheal Walls, male   DOB: 11-16-1935, 80 y.o.   MRN: [id number]     Subjective: This patient presents again for a scheduled visit complaining of thickened and elongated toenails that are uncomfortable with shoe wearing walking and request nail debridement  Objective: Orientated 3 Patient transfers from motorized chair to treatment table BK amputation right Toenails 1-5 right are elongated, brittle, deformed No open skin lesions right Resolve skin lesion fourth and second right webspace DP and PT pulses 1/l4eft  Patient 10 g monofilament wire 0/5 ref Vibratory sensation nonreactive left Ankle reflex is weak reactive left    Assessment: Mycotic toenails 5 left Peripheral arterial disease Peripheral neuropathy Diabetic  Plan: Debrided toenails 5 mechanically electrically without any bleeding  Reappoint 3 months

## 2016-03-29 NOTE — Patient Instructions (Signed)
Diabetes and Foot Care Diabetes may cause you to have problems because of poor blood supply (circulation) to your feet and legs. This may cause the skin on your feet to become thinner, break easier, and heal more slowly. Your skin may become dry, and the skin may peel and crack. You may also have nerve damage in your legs and feet causing decreased feeling in them. You may not notice minor injuries to your feet that could lead to infections or more serious problems. Taking care of your feet is one of the most important things you can do for yourself.  HOME CARE INSTRUCTIONS  Wear shoes at all times, even in the house. Do not go barefoot. Bare feet are easily injured.  Check your feet daily for blisters, cuts, and redness. If you cannot see the bottom of your feet, use a mirror or ask someone for help.  Wash your feet with warm water (do not use hot water) and mild soap. Then pat your feet and the areas between your toes until they are completely dry. Do not soak your feet as this can dry your skin.  Apply a moisturizing lotion or petroleum jelly (that does not contain alcohol and is unscented) to the skin on your feet and to dry, brittle toenails. Do not apply lotion between your toes.  Trim your toenails straight across. Do not dig under them or around the cuticle. File the edges of your nails with an emery board or nail file.  Do not cut corns or calluses or try to remove them with medicine.  Wear clean socks or stockings every day. Make sure they are not too tight. Do not wear knee-high stockings since they may decrease blood flow to your legs.  Wear shoes that fit properly and have enough cushioning. To break in new shoes, wear them for just a few hours a day. This prevents you from injuring your feet. Always look in your shoes before you put them on to be sure there are no objects inside.  Do not cross your legs. This may decrease the blood flow to your feet.  If you find a minor scrape,  cut, or break in the skin on your feet, keep it and the skin around it clean and dry. These areas may be cleansed with mild soap and water. Do not cleanse the area with peroxide, alcohol, or iodine.  When you remove an adhesive bandage, be sure not to damage the skin around it.  If you have a wound, look at it several times a day to make sure it is healing.  Do not use heating pads or hot water bottles. They may burn your skin. If you have lost feeling in your feet or legs, you may not know it is happening until it is too late.  Make sure your health care provider performs a complete foot exam at least annually or more often if you have foot problems. Report any cuts, sores, or bruises to your health care provider immediately. SEEK MEDICAL CARE IF:   You have an injury that is not healing.  You have cuts or breaks in the skin.  You have an ingrown nail.  You notice redness on your legs or feet.  You feel burning or tingling in your legs or feet.  You have pain or cramps in your legs and feet.  Your legs or feet are numb.  Your feet always feel cold. SEEK IMMEDIATE MEDICAL CARE IF:   There is increasing redness,   swelling, or pain in or around a wound.  There is a red line that goes up your leg.  Pus is coming from a wound.  You develop a fever or as directed by your health care provider.  You notice a bad smell coming from an ulcer or wound.   This information is not intended to replace advice given to you by your health care provider. Make sure you discuss any questions you have with your health care provider.   Document Released: 05/27/2000 Document Revised: 01/30/2013 Document Reviewed: 11/06/2012 [employer] Interactive Patient Education 2016 [employer]  

## 2016-05-19 DIAGNOSIS — H43813 Vitreous degeneration, bilateral: Secondary | ICD-10-CM | POA: Diagnosis not present

## 2016-05-19 DIAGNOSIS — H35361 Drusen (degenerative) of macula, right eye: Secondary | ICD-10-CM | POA: Diagnosis not present

## 2016-05-19 DIAGNOSIS — H35043 Retinal micro-aneurysms, unspecified, bilateral: Secondary | ICD-10-CM | POA: Diagnosis not present

## 2016-05-19 DIAGNOSIS — E113411 Type 2 diabetes mellitus with severe nonproliferative diabetic retinopathy with macular edema, right eye: Secondary | ICD-10-CM | POA: Diagnosis not present

## 2016-05-19 DIAGNOSIS — E113412 Type 2 diabetes mellitus with severe nonproliferative diabetic retinopathy with macular edema, left eye: Secondary | ICD-10-CM | POA: Diagnosis not present

## 2016-05-30 DIAGNOSIS — E1121 Type 2 diabetes mellitus with diabetic nephropathy: Secondary | ICD-10-CM | POA: Diagnosis not present

## 2016-05-30 DIAGNOSIS — N183 Chronic kidney disease, stage 3 (moderate): Secondary | ICD-10-CM | POA: Diagnosis not present

## 2016-05-30 DIAGNOSIS — R252 Cramp and spasm: Secondary | ICD-10-CM | POA: Diagnosis not present

## 2016-05-30 DIAGNOSIS — I129 Hypertensive chronic kidney disease with stage 1 through stage 4 chronic kidney disease, or unspecified chronic kidney disease: Secondary | ICD-10-CM | POA: Diagnosis not present

## 2016-06-28 ENCOUNTER — Encounter: Payer: Self-pay | Admitting: Podiatry

## 2016-06-28 ENCOUNTER — Ambulatory Visit (INDEPENDENT_AMBULATORY_CARE_PROVIDER_SITE_OTHER): Payer: Medicare Other | Admitting: Podiatry

## 2016-06-28 VITALS — BP 174/84 | HR 84 | Resp 18

## 2016-06-28 DIAGNOSIS — I739 Peripheral vascular disease, unspecified: Secondary | ICD-10-CM | POA: Diagnosis not present

## 2016-06-28 DIAGNOSIS — B351 Tinea unguium: Secondary | ICD-10-CM

## 2016-06-28 DIAGNOSIS — E1149 Type 2 diabetes mellitus with other diabetic neurological complication: Secondary | ICD-10-CM

## 2016-06-28 NOTE — Patient Instructions (Signed)

## 2016-06-28 NOTE — Progress Notes (Signed)
Patient ID: Micheal Walls, male   DOB: [birth year], 81 y.o.   MRN: [id number]    Subjective: This patient presents again for a scheduled visit complaining of thickened and elongated toenails that are uncomfortable with shoe wearing walking and request nail debridement  Objective: Orientated 3 Patient transfers from motorized chair to treatment table BK amputation right Toenails 1-5 right are elongated, brittle, deformed No open skin lesions right Resolve skin lesion fourth and second right webspace DP and PT pulses 1/l4eft  Patient 10 g monofilament wire 0/5 ref Vibratory sensation nonreactive left Ankle reflex is weak reactive left    Assessment: Mycotic toenails 5 left Peripheral arterial disease Peripheral neuropathy Diabetic  Plan: Debrided toenails 5 mechanically electrically without any bleeding  Reappoint 3 months

## 2016-07-27 DIAGNOSIS — H01003 Unspecified blepharitis right eye, unspecified eyelid: Secondary | ICD-10-CM | POA: Diagnosis not present

## 2016-07-27 DIAGNOSIS — H04123 Dry eye syndrome of bilateral lacrimal glands: Secondary | ICD-10-CM | POA: Diagnosis not present

## 2016-07-27 DIAGNOSIS — H40052 Ocular hypertension, left eye: Secondary | ICD-10-CM | POA: Diagnosis not present

## 2016-07-27 DIAGNOSIS — H401132 Primary open-angle glaucoma, bilateral, moderate stage: Secondary | ICD-10-CM | POA: Diagnosis not present

## 2016-08-29 DIAGNOSIS — E1121 Type 2 diabetes mellitus with diabetic nephropathy: Secondary | ICD-10-CM | POA: Diagnosis not present

## 2016-08-29 DIAGNOSIS — N183 Chronic kidney disease, stage 3 (moderate): Secondary | ICD-10-CM | POA: Diagnosis not present

## 2016-08-29 DIAGNOSIS — N08 Glomerular disorders in diseases classified elsewhere: Secondary | ICD-10-CM | POA: Diagnosis not present

## 2016-08-29 DIAGNOSIS — I129 Hypertensive chronic kidney disease with stage 1 through stage 4 chronic kidney disease, or unspecified chronic kidney disease: Secondary | ICD-10-CM | POA: Diagnosis not present

## 2016-10-04 ENCOUNTER — Ambulatory Visit (INDEPENDENT_AMBULATORY_CARE_PROVIDER_SITE_OTHER): Payer: Medicare Other | Admitting: Podiatry

## 2016-10-04 ENCOUNTER — Encounter: Payer: Self-pay | Admitting: Podiatry

## 2016-10-04 DIAGNOSIS — B351 Tinea unguium: Secondary | ICD-10-CM

## 2016-10-04 NOTE — Progress Notes (Signed)
Patient ID: Micheal Walls, male   DOB: [birth year], 81 y.o.   MRN: [id number]   Patient left room and discharged himself prior to treatment

## 2016-10-05 DIAGNOSIS — M109 Gout, unspecified: Secondary | ICD-10-CM | POA: Diagnosis not present

## 2016-10-05 DIAGNOSIS — E785 Hyperlipidemia, unspecified: Secondary | ICD-10-CM | POA: Diagnosis not present

## 2016-10-05 DIAGNOSIS — N3289 Other specified disorders of bladder: Secondary | ICD-10-CM | POA: Diagnosis not present

## 2016-10-05 DIAGNOSIS — Z6841 Body Mass Index (BMI) 40.0 and over, adult: Secondary | ICD-10-CM | POA: Diagnosis not present

## 2016-10-05 DIAGNOSIS — I129 Hypertensive chronic kidney disease with stage 1 through stage 4 chronic kidney disease, or unspecified chronic kidney disease: Secondary | ICD-10-CM | POA: Diagnosis not present

## 2016-10-05 DIAGNOSIS — E118 Type 2 diabetes mellitus with unspecified complications: Secondary | ICD-10-CM | POA: Diagnosis not present

## 2016-10-05 DIAGNOSIS — N183 Chronic kidney disease, stage 3 (moderate): Secondary | ICD-10-CM | POA: Diagnosis not present

## 2016-10-11 DIAGNOSIS — Z1379 Encounter for other screening for genetic and chromosomal anomalies: Secondary | ICD-10-CM | POA: Diagnosis not present

## 2016-10-11 DIAGNOSIS — Z800 Family history of malignant neoplasm of digestive organs: Secondary | ICD-10-CM | POA: Diagnosis not present

## 2016-10-11 DIAGNOSIS — Z8041 Family history of malignant neoplasm of ovary: Secondary | ICD-10-CM | POA: Diagnosis not present

## 2016-10-27 DIAGNOSIS — H35361 Drusen (degenerative) of macula, right eye: Secondary | ICD-10-CM | POA: Diagnosis not present

## 2016-10-27 DIAGNOSIS — E113493 Type 2 diabetes mellitus with severe nonproliferative diabetic retinopathy without macular edema, bilateral: Secondary | ICD-10-CM | POA: Diagnosis not present

## 2016-10-27 DIAGNOSIS — H353132 Nonexudative age-related macular degeneration, bilateral, intermediate dry stage: Secondary | ICD-10-CM | POA: Diagnosis not present

## 2016-10-27 DIAGNOSIS — H35372 Puckering of macula, left eye: Secondary | ICD-10-CM | POA: Diagnosis not present

## 2016-11-08 ENCOUNTER — Ambulatory Visit (INDEPENDENT_AMBULATORY_CARE_PROVIDER_SITE_OTHER): Payer: Medicare Other | Admitting: Podiatry

## 2016-11-08 ENCOUNTER — Encounter: Payer: Self-pay | Admitting: Podiatry

## 2016-11-08 DIAGNOSIS — I739 Peripheral vascular disease, unspecified: Secondary | ICD-10-CM

## 2016-11-08 DIAGNOSIS — E1142 Type 2 diabetes mellitus with diabetic polyneuropathy: Secondary | ICD-10-CM

## 2016-11-08 DIAGNOSIS — B351 Tinea unguium: Secondary | ICD-10-CM | POA: Diagnosis not present

## 2016-11-08 NOTE — Patient Instructions (Signed)

## 2016-11-08 NOTE — Progress Notes (Signed)
Patient ID: Micheal Walls, male   DOB: 1936/05/09, 81 y.o.   MRN: [id number]    Subjective: This patient presents again for a scheduled visit complaining of thickened and elongated toenails that are uncomfortable. The patient's grandson is present to treatment room  Objective: Orientated 3 Patient transfers from motorized chair to treatment table BK amputation right Atrophic skin with absent hair growth bilaterally Toenails 1-5 right are elongated, brittle, deformed No open skin lesions right Resolve skin lesion fourth and second right webspace Mild peripheral pitting edema left DP and PT pulses 0/4 left Capillary reflex delayed left Patient 10 g monofilament wire 0/5 ref Vibratory sensation nonreactive left Ankle reflex is weak reactive left Dorsi flexion, plantar flexion 5/5 left    Assessment: Mycotic toenails 5 left Diabetic Peripheral arterial disease Diabetic Peripheral neuropathy   Plan: Debrided toenails 5 mechanically electrically without any bleeding  Reappoint 3 months

## 2016-11-28 DIAGNOSIS — N08 Glomerular disorders in diseases classified elsewhere: Secondary | ICD-10-CM | POA: Diagnosis not present

## 2016-11-28 DIAGNOSIS — Z794 Long term (current) use of insulin: Secondary | ICD-10-CM | POA: Diagnosis not present

## 2016-11-28 DIAGNOSIS — R42 Dizziness and giddiness: Secondary | ICD-10-CM | POA: Diagnosis not present

## 2016-11-28 DIAGNOSIS — E1121 Type 2 diabetes mellitus with diabetic nephropathy: Secondary | ICD-10-CM | POA: Diagnosis not present

## 2016-11-28 DIAGNOSIS — I129 Hypertensive chronic kidney disease with stage 1 through stage 4 chronic kidney disease, or unspecified chronic kidney disease: Secondary | ICD-10-CM | POA: Diagnosis not present

## 2016-11-28 DIAGNOSIS — Z993 Dependence on wheelchair: Secondary | ICD-10-CM | POA: Diagnosis not present

## 2017-02-02 DIAGNOSIS — H31013 Macula scars of posterior pole (postinflammatory) (post-traumatic), bilateral: Secondary | ICD-10-CM | POA: Diagnosis not present

## 2017-02-02 DIAGNOSIS — H35372 Puckering of macula, left eye: Secondary | ICD-10-CM | POA: Diagnosis not present

## 2017-02-02 DIAGNOSIS — H401132 Primary open-angle glaucoma, bilateral, moderate stage: Secondary | ICD-10-CM | POA: Diagnosis not present

## 2017-02-02 DIAGNOSIS — E113493 Type 2 diabetes mellitus with severe nonproliferative diabetic retinopathy without macular edema, bilateral: Secondary | ICD-10-CM | POA: Diagnosis not present

## 2017-02-06 ENCOUNTER — Encounter: Payer: Self-pay | Admitting: Podiatry

## 2017-02-06 ENCOUNTER — Ambulatory Visit (INDEPENDENT_AMBULATORY_CARE_PROVIDER_SITE_OTHER): Payer: Medicare Other | Admitting: Podiatry

## 2017-02-06 DIAGNOSIS — E1065 Type 1 diabetes mellitus with hyperglycemia: Secondary | ICD-10-CM | POA: Diagnosis not present

## 2017-02-06 DIAGNOSIS — E1051 Type 1 diabetes mellitus with diabetic peripheral angiopathy without gangrene: Secondary | ICD-10-CM

## 2017-02-06 DIAGNOSIS — B351 Tinea unguium: Secondary | ICD-10-CM | POA: Diagnosis not present

## 2017-02-06 DIAGNOSIS — E1142 Type 2 diabetes mellitus with diabetic polyneuropathy: Secondary | ICD-10-CM | POA: Diagnosis not present

## 2017-02-06 DIAGNOSIS — IMO0002 Reserved for concepts with insufficient information to code with codable children: Secondary | ICD-10-CM

## 2017-02-06 DIAGNOSIS — E1149 Type 2 diabetes mellitus with other diabetic neurological complication: Secondary | ICD-10-CM

## 2017-02-06 NOTE — Progress Notes (Signed)
Patient ID: Micheal Walls, male   DOB: 09-16-1935, 81 y.o.   MRN: [id number]    Subjective: This patient presents again for a scheduled visit complaining of thickened and elongated toenails that are uncomfortable. The patient's grandson is present to treatment room  Objective: Orientated 3 Patient transfers from motorized chair to treatment table BK amputation right Atrophic skin with absent hair growth bilaterally Toenails 1-5 right are elongated, brittle, deformed No open skin lesions right Resolve skin lesion fourth and second right webspace Mild peripheral pitting edema left DP and PT pulses 0/4 left Capillary reflex delayed left Patient 10 g monofilament wire 0/5 ref Vibratory sensation nonreactive left Ankle reflex is weak reactive left Dorsi flexion, plantar flexion 5/5 left    Assessment: Mycotic toenails 5 left Diabetic Peripheral arterial disease Diabetic Peripheral neuropathy   Plan: Debrided toenails 5 mechanically electrically without any bleeding  Reappoint 3 months

## 2017-02-06 NOTE — Patient Instructions (Signed)

## 2017-02-08 ENCOUNTER — Ambulatory Visit: Payer: Medicare Other | Admitting: Podiatry

## 2017-02-27 DIAGNOSIS — N183 Chronic kidney disease, stage 3 (moderate): Secondary | ICD-10-CM | POA: Diagnosis not present

## 2017-02-27 DIAGNOSIS — Z0000 Encounter for general adult medical examination without abnormal findings: Secondary | ICD-10-CM | POA: Diagnosis not present

## 2017-02-27 DIAGNOSIS — G4733 Obstructive sleep apnea (adult) (pediatric): Secondary | ICD-10-CM | POA: Diagnosis not present

## 2017-02-27 DIAGNOSIS — Z79899 Other long term (current) drug therapy: Secondary | ICD-10-CM | POA: Diagnosis not present

## 2017-02-27 DIAGNOSIS — I129 Hypertensive chronic kidney disease with stage 1 through stage 4 chronic kidney disease, or unspecified chronic kidney disease: Secondary | ICD-10-CM | POA: Diagnosis not present

## 2017-02-27 DIAGNOSIS — H3500 Unspecified background retinopathy: Secondary | ICD-10-CM | POA: Diagnosis not present

## 2017-02-27 DIAGNOSIS — Z899 Acquired absence of limb, unspecified: Secondary | ICD-10-CM | POA: Diagnosis not present

## 2017-02-27 DIAGNOSIS — I739 Peripheral vascular disease, unspecified: Secondary | ICD-10-CM | POA: Diagnosis not present

## 2017-02-27 DIAGNOSIS — E1121 Type 2 diabetes mellitus with diabetic nephropathy: Secondary | ICD-10-CM | POA: Diagnosis not present

## 2017-02-27 DIAGNOSIS — R351 Nocturia: Secondary | ICD-10-CM | POA: Diagnosis not present

## 2017-03-13 DIAGNOSIS — E118 Type 2 diabetes mellitus with unspecified complications: Secondary | ICD-10-CM | POA: Diagnosis not present

## 2017-03-13 DIAGNOSIS — I129 Hypertensive chronic kidney disease with stage 1 through stage 4 chronic kidney disease, or unspecified chronic kidney disease: Secondary | ICD-10-CM | POA: Diagnosis not present

## 2017-03-13 DIAGNOSIS — Z6841 Body Mass Index (BMI) 40.0 and over, adult: Secondary | ICD-10-CM | POA: Diagnosis not present

## 2017-03-13 DIAGNOSIS — E785 Hyperlipidemia, unspecified: Secondary | ICD-10-CM | POA: Diagnosis not present

## 2017-03-13 DIAGNOSIS — N3289 Other specified disorders of bladder: Secondary | ICD-10-CM | POA: Diagnosis not present

## 2017-03-13 DIAGNOSIS — N183 Chronic kidney disease, stage 3 (moderate): Secondary | ICD-10-CM | POA: Diagnosis not present

## 2017-03-13 DIAGNOSIS — M109 Gout, unspecified: Secondary | ICD-10-CM | POA: Diagnosis not present

## 2017-04-24 DIAGNOSIS — H353132 Nonexudative age-related macular degeneration, bilateral, intermediate dry stage: Secondary | ICD-10-CM | POA: Diagnosis not present

## 2017-04-24 DIAGNOSIS — H35372 Puckering of macula, left eye: Secondary | ICD-10-CM | POA: Diagnosis not present

## 2017-04-24 DIAGNOSIS — E113493 Type 2 diabetes mellitus with severe nonproliferative diabetic retinopathy without macular edema, bilateral: Secondary | ICD-10-CM | POA: Diagnosis not present

## 2017-04-24 DIAGNOSIS — H35043 Retinal micro-aneurysms, unspecified, bilateral: Secondary | ICD-10-CM | POA: Diagnosis not present

## 2017-05-08 ENCOUNTER — Ambulatory Visit (INDEPENDENT_AMBULATORY_CARE_PROVIDER_SITE_OTHER): Payer: Medicare Other | Admitting: Podiatry

## 2017-05-08 ENCOUNTER — Encounter: Payer: Self-pay | Admitting: Podiatry

## 2017-05-08 DIAGNOSIS — I739 Peripheral vascular disease, unspecified: Secondary | ICD-10-CM | POA: Diagnosis not present

## 2017-05-08 DIAGNOSIS — E1142 Type 2 diabetes mellitus with diabetic polyneuropathy: Secondary | ICD-10-CM | POA: Diagnosis not present

## 2017-05-08 DIAGNOSIS — B351 Tinea unguium: Secondary | ICD-10-CM | POA: Diagnosis not present

## 2017-05-08 NOTE — Patient Instructions (Signed)

## 2017-05-08 NOTE — Progress Notes (Signed)
Patient ID: Micheal Walls, male   DOB: 1936/04/11, 81 y.o.   MRN: [id number]    Subjective: This patient presents again for a scheduled visit complaining of thickened and elongated toenails that are uncomfortable.The patient's grandson is present to treatment room  Objective: Orientated 3 Patient transfers from motorized chair to treatment table BK amputation right Atrophic skin with absent hair growth bilaterally Toenails 1-5 right are elongated, brittle, deformed No open skin lesions right Resolve skin lesion fourth and second right webspace Mild peripheral pitting edema left DP and PT pulses 0/4 left Capillary reflex delayed left Patient 10 g monofilament wire 0/5 ref Vibratory sensation nonreactive left Ankle reflex is weak reactive left Dorsi flexion, plantar flexion 5/5 left    Assessment: Mycotic toenails 5 left Diabetic Peripheral arterial disease Diabetic Peripheral neuropathy   Plan: Debrided toenails 5 mechanically electrically without any bleeding  Reappoint 3 months

## 2017-05-16 ENCOUNTER — Telehealth: Payer: Self-pay | Admitting: Cardiovascular Disease

## 2017-05-16 NOTE — Telephone Encounter (Signed)
New message   Pt verbalized that he is calling to speak tot the rn he said that Dr.C handles pt sleep and his C-pap is not working properly
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
## 2017-05-17 NOTE — Telephone Encounter (Signed)
Spoke with [name] at [facility] does NOT need need sleep study, but will need OV to get new machine.    Called patient, machine is not working at all now.  Patient scheduled for tomorrow 12/6 with Dr. Claiborne Billings.

## 2017-05-18 ENCOUNTER — Ambulatory Visit (INDEPENDENT_AMBULATORY_CARE_PROVIDER_SITE_OTHER): Payer: Medicare Other | Admitting: Cardiovascular Disease

## 2017-05-18 ENCOUNTER — Encounter: Payer: Self-pay | Admitting: Cardiovascular Disease

## 2017-05-18 VITALS — BP 136/60 | HR 76 | Ht 73.0 in | Wt 301.0 lb

## 2017-05-18 DIAGNOSIS — Z89511 Acquired absence of right leg below knee: Secondary | ICD-10-CM | POA: Diagnosis not present

## 2017-05-18 DIAGNOSIS — N183 Chronic kidney disease, stage 3 unspecified: Secondary | ICD-10-CM

## 2017-05-18 DIAGNOSIS — Z9989 Dependence on other enabling machines and devices: Secondary | ICD-10-CM

## 2017-05-18 DIAGNOSIS — G4733 Obstructive sleep apnea (adult) (pediatric): Secondary | ICD-10-CM | POA: Diagnosis not present

## 2017-05-18 DIAGNOSIS — I10 Essential (primary) hypertension: Secondary | ICD-10-CM

## 2017-05-18 NOTE — Patient Instructions (Signed)
Medication Instructions:  Your physician recommends that you continue on your current medications as directed. Please refer to the Current Medication list given to you today.  Follow-Up: Your physician recommends that you schedule a follow-up appointment in: 3 months with Dr. Claiborne Billings (sleep clinic)   Any Other Special Instructions Will Be Listed Below (If Applicable).  We will fax orders to [facility] for a new CPAP machine-they will contact you to get you set up   If you need a refill on your cardiac medications before your next appointment, please call your pharmacy.

## 2017-05-19 ENCOUNTER — Encounter: Payer: Self-pay | Admitting: Cardiovascular Disease

## 2017-05-19 NOTE — Progress Notes (Signed)
Cardiology Office Note    Date:  05/19/2017   ID:  Micheal Walls, DOB 02/09/1936, MRN [id number]  PCP:  Glendale Chard, MD  Cardiologist:  Shelva Majestic, MD (sleep); Cr. Croituru  Chief Complaint  Patient presents with  . Follow-up   Initial sleep evaluation  History of Present Illness:  Micheal Walls is a 81 y.o. male who presents to the office today for evaluation of his sleep apnea and consideration for a new machine.  Micheal Walls is a 81 year old Afro-American male who has a history of hypertension, diastolic heart failure, diabetes mellitus, and hyperlipidemia.  In 2011.  He was referred for a sleep study due to concerns for obstructive sleep apnea.  At that time, he had loud snoring, witnessed apnea, gasping for breath, and nonrestorative sleep.  A diagnostic polysomnogram done on 09/22/2009 showed moderate obstructive sleep apnea overall with an AHI of 23.46 per hour.  Sleep apnea was severe during rim sleep at 57.39 per hour.  He had significant oxygen desaturation to 82% with non-REM sleep and 50% with rems sleep.  There was moderate snoring.  He underwent CPAP titration and was titrated up to 11 7 m water pressure on 10/19/2009.  He has been utilizing CPAP therapy ever since and admits to 100% compliance with use.  Over the last several months, his machine had begun to make intermittent noises and started to intermittently malfunction.  Apparently, on May 31, 2017, the machine completely died and is no longer turns on for CPAP therapy.  His DME company is [facility].  His old machine is arrest Brontex CPAP unit.  His start date was 12/02/2009.  Presently, he goes to bed at 11 PM and wakes up at 9 AM.  He has 2-3 episodes of nocturia per night.  He is added onto my schedule today since his machine is no longer functional and he is in need for a new CPAP machine as soon as possible.   An Epworth Sleepiness Scale score was recalculated in the office today and this was  elevated at 14 and was consistent with daytime sleepiness, particularly since his machine has been nonfunctional.  He denies any chest pain.  He denies any PND orthopnea.  He status post right BKA.  He has a history of chronic renal insufficiency.  Past Medical History:  Diagnosis Date  . Amputated below knee ([place])    right  . Anemia   . Diastolic heart failure ([place])   . Diverticulosis   . DM (diabetes mellitus) ([place])   . Gout   . Hyperlipemia   . OSA on CPAP   . RBBB   . Systemic hypertension     Past Surgical History:  Procedure Laterality Date  . LEG AMPUTATION BELOW KNEE     right  . NM MYOCAR PERF WALL MOTION  09/18/2009   no ischemia    Current Medications: Outpatient Medications Prior to Visit  Medication Sig Dispense Refill  . allopurinol (ZYLOPRIM) 100 MG tablet Take 100 mg by mouth daily.    Marland Kitchen aspirin 81 MG tablet Take 162 mg by mouth daily.    . cephALEXin (KEFLEX) 500 MG capsule Take 1 capsule (500 mg total) by mouth 3 (three) times daily. 30 capsule 2  . colchicine 0.6 MG tablet Take 0.6 mg by mouth as needed (gout flare up).     . diltiazem (TIAZAC) 300 MG 24 hr capsule Take 300 mg by mouth daily.    Marland Kitchen econazole nitrate 1 %  cream Apply topically 2 (two) times daily. Apply twice daily to web spaces toes 30 days 30 g 0  . ferrous sulfate 325 (65 FE) MG tablet Take 325 mg by mouth daily with breakfast.    . fish oil-omega-3 fatty acids 1000 MG capsule Take 2 g by mouth daily.    . furosemide (LASIX) 40 MG tablet Take 1 tablet (40 mg total) by mouth 2 (two) times daily. 180 tablet 3  . Insulin Aspart (NOVOLOG [place]) Inject 42-48 Units into the skin 2 (two) times daily. 48 in the am and 42 at night.    . Insulin Detemir (LEVEMIR FLEXTOUCH) 100 UNIT/ML SOPN Inject 20 Units into the skin at bedtime.    . ketotifen (ALAWAY) 0.025 % ophthalmic solution Place 1 drop into both eyes 2 (two) times daily.    . losartan (COZAAR) 100 MG tablet Take 1 tablet by mouth daily.    .  Multiple Vitamin (MULTIVITAMIN) tablet Take 1 tablet by mouth daily.    . niacin (NIASPAN) 500 MG CR tablet Take 1,000 mg by mouth at bedtime.    . pantoprazole (PROTONIX) 40 MG tablet Take 40 mg by mouth daily.    . Polyvinyl Alcohol-Povidone (REFRESH OP) Apply 1 drop to eye 2 (two) times daily.    . PRESCRIPTION MEDICATION Place 1 drop into both eyes 2 (two) times daily. For glucoma    . rosuvastatin (CRESTOR) 10 MG tablet Take 10 mg by mouth daily.     . tamsulosin (FLOMAX) 0.4 MG CAPS capsule Take 1 capsule by mouth daily.     No facility-administered medications prior to visit.      Allergies:   Patient has no known allergies.   Social History   Socioeconomic History  . Marital status: Married    Spouse name: None  . Number of children: None  . Years of education: None  . Highest education level: None  Social Needs  . Financial resource strain: None  . Food insecurity - worry: None  . Food insecurity - inability: None  . Transportation needs - medical: None  . Transportation needs - non-medical: None  Occupational History  . None  Tobacco Use  . Smoking status: Former Smoker    Types: Cigars    Last attempt to quit: 06/12/2001    Years since quitting: 15.9  . Smokeless tobacco: Never Used  Substance and Sexual Activity  . Alcohol use: No    Alcohol/week: 0.0 oz  . Drug use: No  . Sexual activity: None  Other Topics Concern  . None  Social History Narrative  . None     Family History:  The patient's family history includes Cancer in his sister; Diabetes in his mother; Heart attack in his father.   ROS General: Negative; No fevers, chills, or night sweats;  HEENT: Negative; No changes in vision or hearing, sinus congestion, difficulty swallowing Pulmonary: Negative; No cough, wheezing, shortness of breath, hemoptysis Cardiovascular: Negative; No chest pain, presyncope, syncope, palpitations GI: Negative; No nausea, vomiting, diarrhea, or abdominal pain GU:  Negative; No dysuria, hematuria, or difficulty voiding Musculoskeletal: Positive for right below the knee amputation Hematologic/Oncology: Negative; no easy bruising, bleeding Endocrine: Positive for diabetes mellitus Neuro: Negative; no changes in balance, headaches Skin: Negative; No rashes or skin lesions Psychiatric: Negative; No behavioral problems, depression Sleep: Positive for OSA, snoring, daytime sleepiness, hypersomnolence; No bruxism, restless legs, hypnogognic hallucinations, no cataplexy Other comprehensive 14 point system review is negative.   PHYSICAL EXAM:     VS:  BP 136/60   Pulse 76   Ht 6' 1" (1.854 m)   Wt (!) 301 lb (136.5 kg)   BMI 39.71 kg/m    Wt Readings from Last 3 Encounters:  05/18/17 (!) 301 lb (136.5 kg)  09/17/15 264 lb 6 oz (119.9 kg)  07/22/13 285 lb (129.3 kg)    General: Alert, oriented, no distress.  Skin: normal turgor, no rashes, warm and dry HEENT: Normocephalic, atraumatic. Pupils equal round and reactive to light; sclera anicteric; extraocular muscles intact; arcus senilis Nose without nasal septal hypertrophy Mouth/Parynx benign; Mallinpatti scale 3/4 Neck: No JVD, no carotid bruits; normal carotid upstroke Lungs: clear to ausculatation and percussion; no wheezing or rales Chest wall: without tenderness to palpitation Heart: PMI not displaced, RRR, s1 s2 normal, 1/6 systolic murmur, no diastolic murmur, no rubs, gallops, thrills, or heaves Abdomen: soft, nontender; no hepatosplenomehaly, BS+; abdominal aorta nontender and not dilated by palpation. Back: no CVA tenderness Pulses 2+ Musculoskeletal: Right below the knee amputation; 1+ edema, left ankle Extremities: no clubbing cyanosis or edema, Homan's sign negative  Neurologic: grossly nonfocal; Cranial nerves grossly wnl Psychologic: Normal mood and affect   Studies/Labs Reviewed:   EKG:  EKG is not ordered today.   Recent Labs: BMP Latest Ref Rng & Units 01/14/2011 01/13/2011  01/12/2011  Glucose 70 - 99 mg/dL 139(H) 138(H) 104(H)  BUN 6 - 23 mg/dL 36(H) 30(H) 27(H)  Creatinine 0.50 - 1.35 mg/dL 1.83(H) 1.79(H) 1.87(H)  Sodium 135 - 145 mEq/L 138 137 139  Potassium 3.5 - 5.1 mEq/L 4.1 4.0 4.0  Chloride 96 - 112 mEq/L 106 105 105  CO2 19 - 32 mEq/L 23 25 23  Calcium 8.4 - 10.5 mg/dL 8.4 8.5 8.7     Hepatic Function Latest Ref Rng & Units 01/09/2011 01/08/2011  Total Protein 6.0 - 8.3 g/dL 6.3 6.3  Albumin 3.5 - 5.2 g/dL 2.8(L) 2.8(L)  AST 0 - 37 U/L 18 20  ALT 0 - 53 U/L 16 15  Alk Phosphatase 39 - 117 U/L 41 43  Total Bilirubin 0.3 - 1.2 mg/dL 0.4 0.2(L)    CBC Latest Ref Rng & Units 01/14/2011 01/13/2011 01/12/2011  WBC 4.0 - 10.5 K/uL 10.3 10.4 -  Hemoglobin 13.0 - 17.0 g/dL 7.8(L) 8.1(L) 8.1(L)  Hematocrit 39.0 - 52.0 % 24.7(L) 25.2(L) 24.4(L)  Platelets 150 - 400 K/uL 196 184 -   Lab Results  Component Value Date   MCV 83.4 01/14/2011   MCV 82.9 01/13/2011   MCV 84.0 01/12/2011   Lab Results  Component Value Date   TSH 2.817 01/09/2011   Lab Results  Component Value Date   HGBA1C 6.7 (H) 01/09/2011     BNP No results found for: BNP  ProBNP    Component Value Date/Time   PROBNP 197.2 (H) 01/09/2011 0745     Lipid Panel  No results found for: CHOL, TRIG, HDL, CHOLHDL, VLDL, LDLCALC, LDLDIRECT   RADIOLOGY: No results found.   Additional studies/ records that were reviewed today include:  I reviewed the patient's diagnostic polysomnogram and CPAP titration trials from 2011.  I reviewed office records from Dr. [name].   ASSESSMENT:    1. OSA on CPAP   2. Essential hypertension   3. CKD (chronic kidney disease) stage 3, GFR 30-59 ml/min (HCC)   4. Severe obesity (BMI 35.0-39.9) with comorbidity (HCC)   5. S/P unilateral BKA (below knee amputation), right (HCC)      PLAN:  Micheal Walls is   a 81-year-old African-American male who has significant cardiovascular comorbidities including systemic hypertension, diastolic  heart failure, diabetes mellitus, hyperlipidemia, and is status post right BKA amputation.  He is been documented to have obstructive sleep apnea since his initial diagnostic polysomnogram in April 2011.  He has been on CPAP therapy since his set up date on 12/02/2009.  Overall, his AHI was 23.46 per hour but during REM sleep events were severe at 57.39 per hour.  He had significant oxygen desaturation to a nadir of 50% during REM sleep.  He admits to 100% compliance and has used CPAP consistently ever since.  His machine is now malfunction and over the past week is now completely nonfunctional.  I have recommended new CPAP ResMed AirSence 10 auto unit.  I will initiate pressure with a range from 8-20 cm of water.  Will try to expedite getting a new machine and if this could not be done within a week, possibly a loaner machine, given to him in the interim.  [facility] is his DME company.  I will see him in a sleep clinic within 90 days of CPAP reinstitution with his new machine.   Medication Adjustments/Labs and Tests Ordered: Current medicines are reviewed at length with the patient today.  Concerns regarding medicines are outlined above.  Medication changes, Labs and Tests ordered today are listed in the Patient Instructions below. Patient Instructions  Medication Instructions:  Your physician recommends that you continue on your current medications as directed. Please refer to the Current Medication list given to you today.  Follow-Up: Your physician recommends that you schedule a follow-up appointment in: 3 months with Dr.  (sleep clinic)   Any Other Special Instructions Will Be Listed Below (If Applicable).  We will fax orders to [facility] for a new CPAP machine-they will contact you to get you set up   If you need a refill on your cardiac medications before your next appointment, please call your pharmacy.      Signed,  , MD  05/19/2017 12:07 PM    [facility] [address], [address], [place], [place]  [address] Phone: [phone]    

## 2017-05-25 ENCOUNTER — Telehealth: Payer: Self-pay | Admitting: Cardiovascular Disease

## 2017-05-25 NOTE — Telephone Encounter (Signed)
New Message     Barbaraann Rondo from [employer] is f/u on fax that he sent requiring Dr. Claiborne Billings signature for a CPAP machine. He states that the portion unsigned is for the diagnosis. He stated he faxed in yesterday please fax [phone].

## 2017-05-25 NOTE — Telephone Encounter (Signed)
Called Micheal Walls at [facility] not receive fax.   Request to refax for Dr. Claiborne Billings to sign.   Advised Dr. Claiborne Billings out of office until next week.   Sleep study from 2011 is not signed (media).

## 2017-05-29 DIAGNOSIS — E1121 Type 2 diabetes mellitus with diabetic nephropathy: Secondary | ICD-10-CM | POA: Diagnosis not present

## 2017-05-29 DIAGNOSIS — N08 Glomerular disorders in diseases classified elsewhere: Secondary | ICD-10-CM | POA: Diagnosis not present

## 2017-05-29 DIAGNOSIS — I129 Hypertensive chronic kidney disease with stage 1 through stage 4 chronic kidney disease, or unspecified chronic kidney disease: Secondary | ICD-10-CM | POA: Diagnosis not present

## 2017-05-29 DIAGNOSIS — R05 Cough: Secondary | ICD-10-CM | POA: Diagnosis not present

## 2017-05-29 DIAGNOSIS — N183 Chronic kidney disease, stage 3 (moderate): Secondary | ICD-10-CM | POA: Diagnosis not present

## 2017-05-29 NOTE — Telephone Encounter (Signed)
Study signed by Dr. Claiborne Billings and returned to [facility].

## 2017-06-03 DIAGNOSIS — I129 Hypertensive chronic kidney disease with stage 1 through stage 4 chronic kidney disease, or unspecified chronic kidney disease: Secondary | ICD-10-CM | POA: Diagnosis not present

## 2017-06-03 DIAGNOSIS — E559 Vitamin D deficiency, unspecified: Secondary | ICD-10-CM | POA: Diagnosis not present

## 2017-06-03 DIAGNOSIS — E7800 Pure hypercholesterolemia, unspecified: Secondary | ICD-10-CM | POA: Diagnosis not present

## 2017-06-03 DIAGNOSIS — G4733 Obstructive sleep apnea (adult) (pediatric): Secondary | ICD-10-CM | POA: Diagnosis not present

## 2017-06-03 DIAGNOSIS — Z7982 Long term (current) use of aspirin: Secondary | ICD-10-CM | POA: Diagnosis not present

## 2017-06-03 DIAGNOSIS — N183 Chronic kidney disease, stage 3 (moderate): Secondary | ICD-10-CM | POA: Diagnosis not present

## 2017-06-03 DIAGNOSIS — Z89511 Acquired absence of right leg below knee: Secondary | ICD-10-CM | POA: Diagnosis not present

## 2017-06-03 DIAGNOSIS — I69354 Hemiplegia and hemiparesis following cerebral infarction affecting left non-dominant side: Secondary | ICD-10-CM | POA: Diagnosis not present

## 2017-06-03 DIAGNOSIS — Z794 Long term (current) use of insulin: Secondary | ICD-10-CM | POA: Diagnosis not present

## 2017-06-03 DIAGNOSIS — I2510 Atherosclerotic heart disease of native coronary artery without angina pectoris: Secondary | ICD-10-CM | POA: Diagnosis not present

## 2017-06-03 DIAGNOSIS — K219 Gastro-esophageal reflux disease without esophagitis: Secondary | ICD-10-CM | POA: Diagnosis not present

## 2017-06-03 DIAGNOSIS — M109 Gout, unspecified: Secondary | ICD-10-CM | POA: Diagnosis not present

## 2017-06-03 DIAGNOSIS — E1122 Type 2 diabetes mellitus with diabetic chronic kidney disease: Secondary | ICD-10-CM | POA: Diagnosis not present

## 2017-06-07 ENCOUNTER — Telehealth: Payer: Self-pay | Admitting: *Deleted

## 2017-06-07 NOTE — Telephone Encounter (Signed)
Notification from [facility]  Set up date: 06/02/17  Airsense 10 CPAP Auto 8-20 cm h20 Airfit P10 Nasal Pillow system, small  OV 3/28-needs to be within 90 days of set up date.  Will have scheduler reschedule at sooner date

## 2017-06-13 DIAGNOSIS — E7800 Pure hypercholesterolemia, unspecified: Secondary | ICD-10-CM | POA: Diagnosis not present

## 2017-06-13 DIAGNOSIS — I2510 Atherosclerotic heart disease of native coronary artery without angina pectoris: Secondary | ICD-10-CM | POA: Diagnosis not present

## 2017-06-13 DIAGNOSIS — I129 Hypertensive chronic kidney disease with stage 1 through stage 4 chronic kidney disease, or unspecified chronic kidney disease: Secondary | ICD-10-CM | POA: Diagnosis not present

## 2017-06-13 DIAGNOSIS — E1122 Type 2 diabetes mellitus with diabetic chronic kidney disease: Secondary | ICD-10-CM | POA: Diagnosis not present

## 2017-06-13 DIAGNOSIS — I69354 Hemiplegia and hemiparesis following cerebral infarction affecting left non-dominant side: Secondary | ICD-10-CM | POA: Diagnosis not present

## 2017-06-13 DIAGNOSIS — N183 Chronic kidney disease, stage 3 (moderate): Secondary | ICD-10-CM | POA: Diagnosis not present

## 2017-06-14 DIAGNOSIS — N183 Chronic kidney disease, stage 3 (moderate): Secondary | ICD-10-CM | POA: Diagnosis not present

## 2017-06-14 DIAGNOSIS — E1122 Type 2 diabetes mellitus with diabetic chronic kidney disease: Secondary | ICD-10-CM | POA: Diagnosis not present

## 2017-06-14 DIAGNOSIS — I69354 Hemiplegia and hemiparesis following cerebral infarction affecting left non-dominant side: Secondary | ICD-10-CM | POA: Diagnosis not present

## 2017-06-14 DIAGNOSIS — I129 Hypertensive chronic kidney disease with stage 1 through stage 4 chronic kidney disease, or unspecified chronic kidney disease: Secondary | ICD-10-CM | POA: Diagnosis not present

## 2017-06-14 DIAGNOSIS — E7800 Pure hypercholesterolemia, unspecified: Secondary | ICD-10-CM | POA: Diagnosis not present

## 2017-06-14 DIAGNOSIS — I2510 Atherosclerotic heart disease of native coronary artery without angina pectoris: Secondary | ICD-10-CM | POA: Diagnosis not present

## 2017-06-21 DIAGNOSIS — I2510 Atherosclerotic heart disease of native coronary artery without angina pectoris: Secondary | ICD-10-CM | POA: Diagnosis not present

## 2017-06-21 DIAGNOSIS — E7800 Pure hypercholesterolemia, unspecified: Secondary | ICD-10-CM | POA: Diagnosis not present

## 2017-06-21 DIAGNOSIS — I129 Hypertensive chronic kidney disease with stage 1 through stage 4 chronic kidney disease, or unspecified chronic kidney disease: Secondary | ICD-10-CM | POA: Diagnosis not present

## 2017-06-21 DIAGNOSIS — I69354 Hemiplegia and hemiparesis following cerebral infarction affecting left non-dominant side: Secondary | ICD-10-CM | POA: Diagnosis not present

## 2017-06-21 DIAGNOSIS — N183 Chronic kidney disease, stage 3 (moderate): Secondary | ICD-10-CM | POA: Diagnosis not present

## 2017-06-21 DIAGNOSIS — E1122 Type 2 diabetes mellitus with diabetic chronic kidney disease: Secondary | ICD-10-CM | POA: Diagnosis not present

## 2017-06-27 DIAGNOSIS — I2510 Atherosclerotic heart disease of native coronary artery without angina pectoris: Secondary | ICD-10-CM | POA: Diagnosis not present

## 2017-06-27 DIAGNOSIS — I129 Hypertensive chronic kidney disease with stage 1 through stage 4 chronic kidney disease, or unspecified chronic kidney disease: Secondary | ICD-10-CM | POA: Diagnosis not present

## 2017-06-27 DIAGNOSIS — I69354 Hemiplegia and hemiparesis following cerebral infarction affecting left non-dominant side: Secondary | ICD-10-CM | POA: Diagnosis not present

## 2017-06-27 DIAGNOSIS — N183 Chronic kidney disease, stage 3 (moderate): Secondary | ICD-10-CM | POA: Diagnosis not present

## 2017-06-27 DIAGNOSIS — E7800 Pure hypercholesterolemia, unspecified: Secondary | ICD-10-CM | POA: Diagnosis not present

## 2017-06-27 DIAGNOSIS — E1122 Type 2 diabetes mellitus with diabetic chronic kidney disease: Secondary | ICD-10-CM | POA: Diagnosis not present

## 2017-07-06 DIAGNOSIS — N183 Chronic kidney disease, stage 3 (moderate): Secondary | ICD-10-CM | POA: Diagnosis not present

## 2017-07-06 DIAGNOSIS — E7800 Pure hypercholesterolemia, unspecified: Secondary | ICD-10-CM | POA: Diagnosis not present

## 2017-07-06 DIAGNOSIS — E1122 Type 2 diabetes mellitus with diabetic chronic kidney disease: Secondary | ICD-10-CM | POA: Diagnosis not present

## 2017-07-06 DIAGNOSIS — I129 Hypertensive chronic kidney disease with stage 1 through stage 4 chronic kidney disease, or unspecified chronic kidney disease: Secondary | ICD-10-CM | POA: Diagnosis not present

## 2017-07-06 DIAGNOSIS — I69354 Hemiplegia and hemiparesis following cerebral infarction affecting left non-dominant side: Secondary | ICD-10-CM | POA: Diagnosis not present

## 2017-07-06 DIAGNOSIS — I2510 Atherosclerotic heart disease of native coronary artery without angina pectoris: Secondary | ICD-10-CM | POA: Diagnosis not present

## 2017-07-14 DIAGNOSIS — E1122 Type 2 diabetes mellitus with diabetic chronic kidney disease: Secondary | ICD-10-CM | POA: Diagnosis not present

## 2017-07-14 DIAGNOSIS — N183 Chronic kidney disease, stage 3 (moderate): Secondary | ICD-10-CM | POA: Diagnosis not present

## 2017-07-14 DIAGNOSIS — E7800 Pure hypercholesterolemia, unspecified: Secondary | ICD-10-CM | POA: Diagnosis not present

## 2017-07-14 DIAGNOSIS — I129 Hypertensive chronic kidney disease with stage 1 through stage 4 chronic kidney disease, or unspecified chronic kidney disease: Secondary | ICD-10-CM | POA: Diagnosis not present

## 2017-07-14 DIAGNOSIS — I2510 Atherosclerotic heart disease of native coronary artery without angina pectoris: Secondary | ICD-10-CM | POA: Diagnosis not present

## 2017-07-14 DIAGNOSIS — I69354 Hemiplegia and hemiparesis following cerebral infarction affecting left non-dominant side: Secondary | ICD-10-CM | POA: Diagnosis not present

## 2017-07-17 ENCOUNTER — Encounter: Payer: Self-pay | Admitting: Cardiovascular Disease

## 2017-07-17 ENCOUNTER — Ambulatory Visit (INDEPENDENT_AMBULATORY_CARE_PROVIDER_SITE_OTHER): Payer: Medicare Other | Admitting: Cardiovascular Disease

## 2017-07-17 VITALS — BP 154/73 | HR 83 | Ht 73.0 in | Wt 300.0 lb

## 2017-07-17 DIAGNOSIS — Z6839 Body mass index (BMI) 39.0-39.9, adult: Secondary | ICD-10-CM | POA: Diagnosis not present

## 2017-07-17 DIAGNOSIS — I739 Peripheral vascular disease, unspecified: Secondary | ICD-10-CM | POA: Insufficient documentation

## 2017-07-17 DIAGNOSIS — I10 Essential (primary) hypertension: Secondary | ICD-10-CM

## 2017-07-17 DIAGNOSIS — G4733 Obstructive sleep apnea (adult) (pediatric): Secondary | ICD-10-CM

## 2017-07-17 DIAGNOSIS — E669 Obesity, unspecified: Secondary | ICD-10-CM

## 2017-07-17 DIAGNOSIS — N184 Chronic kidney disease, stage 4 (severe): Secondary | ICD-10-CM

## 2017-07-17 DIAGNOSIS — E782 Mixed hyperlipidemia: Secondary | ICD-10-CM

## 2017-07-17 DIAGNOSIS — Z9989 Dependence on other enabling machines and devices: Secondary | ICD-10-CM

## 2017-07-17 DIAGNOSIS — E1169 Type 2 diabetes mellitus with other specified complication: Secondary | ICD-10-CM | POA: Diagnosis not present

## 2017-07-17 NOTE — Progress Notes (Signed)
Patient ID: Micheal Walls, male   DOB: 08-17-1935, 82 y.o.   MRN: [id number]    Cardiology Office Note    Date:  07/17/2017   ID:  Micheal Walls, DOB 1935-09-29, MRN [id number]  PCP:  Glendale Chard, MD  Cardiologist:   [name], MD   Follow up PAD and multiple vascular risk factors.   History of Present Illness:  Micheal Walls is a 82 y.o. male with a history of previous stroke, right below the knee amputation for diabetes-related peripheral arterial disease, insulin-requiring type 2 diabetes mellitus, hyperlipidemia, hypertension and chronic kidney disease as well as obstructive sleep apnea on chronic CPAP who returns for routine follow-up.  He has been well since his last appointment without major new health challenges.  He remains very obese.  He reports compliance with CPAP 100% of the time and benefits from this treatment.  He denies angina or dyspnea at rest.  He is very sedentary and does not walk since his amputation.  He occasionally has left ankle swelling but this always resolves by the morning after lying in bed.  He does not have orthopnea or PND.  He does not have daytime hypersomnolence.  He has not had any focal neurological complaints.  A visiting nurse checks his blood pressure at home weekly and it is "always good".  Most recent labs (May 29, 2017) show good control of his diabetes mellitus with a hemoglobin A1c of 6.8% and excellent cholesterol parameters with the exception of a mildly decreased HDL of 32.  She has significant chronic kidney disease with most recent creatinine of 2.7 (estimated GFR approximately 25).  He has never had a cardiac catheterization. By noninvasive imaging has no evidence of nuclear perfusion abnormalities and has a normal left ventricular systolic function. Heart failure has not been an issue to date. He has chronic problems with iron deficiency anemia and has previously undergone upper endoscopy, colonoscopy and capsule  enteroscopy without a clear source of bleeding other than some degree of right-sided colonic diverticulosis.   Past Medical History:  Diagnosis Date  . Amputated below knee ([place])    right  . Anemia   . Diastolic heart failure ([place])   . Diverticulosis   . DM (diabetes mellitus) ([place])   . Gout   . Hyperlipemia   . OSA on CPAP   . RBBB   . Systemic hypertension     Past Surgical History:  Procedure Laterality Date  . LEG AMPUTATION BELOW KNEE     right  . NM MYOCAR PERF WALL MOTION  09/18/2009   no ischemia    Current Medications: Outpatient Medications Prior to Visit  Medication Sig Dispense Refill  . allopurinol (ZYLOPRIM) 100 MG tablet Take 100 mg by mouth daily.    Marland Kitchen aspirin 81 MG tablet Take 162 mg by mouth daily.    . cephALEXin (KEFLEX) 500 MG capsule Take 1 capsule (500 mg total) by mouth 3 (three) times daily. 30 capsule 2  . colchicine 0.6 MG tablet Take 0.6 mg by mouth as needed (gout flare up).     . diltiazem (TIAZAC) 300 MG 24 hr capsule Take 300 mg by mouth daily.    Marland Kitchen econazole nitrate 1 % cream Apply topically 2 (two) times daily. Apply twice daily to web spaces toes 30 days 30 g 0  . ferrous sulfate 325 (65 FE) MG tablet Take 325 mg by mouth daily with breakfast.    . fish oil-omega-3 fatty acids 1000 MG  capsule Take 2 g by mouth daily.    . furosemide (LASIX) 40 MG tablet Take 1 tablet (40 mg total) by mouth 2 (two) times daily. 180 tablet 3  . Insulin Aspart (NOVOLOG [place]) Inject 42-48 Units into the skin 2 (two) times daily. 48 in the am and 42 at night.    . Insulin Detemir (LEVEMIR FLEXTOUCH) 100 UNIT/ML SOPN Inject 20 Units into the skin at bedtime.    Marland Kitchen ketotifen (ALAWAY) 0.025 % ophthalmic solution Place 1 drop into both eyes 2 (two) times daily.    Marland Kitchen losartan (COZAAR) 100 MG tablet Take 1 tablet by mouth daily.    . Multiple Vitamin (MULTIVITAMIN) tablet Take 1 tablet by mouth daily.    . niacin (NIASPAN) 500 MG CR tablet Take 1,000 mg by mouth at  bedtime.    . pantoprazole (PROTONIX) 40 MG tablet Take 40 mg by mouth daily.    . Polyvinyl Alcohol-Povidone (REFRESH [place]) Apply 1 drop to eye 2 (two) times daily.    Marland Kitchen PRESCRIPTION MEDICATION Place 1 drop into both eyes 2 (two) times daily. For glucoma    . rosuvastatin (CRESTOR) 10 MG tablet Take 10 mg by mouth daily.     . tamsulosin (FLOMAX) 0.4 MG CAPS capsule Take 1 capsule by mouth daily.     No facility-administered medications prior to visit.      Allergies:   Patient has no known allergies.   Social History   Socioeconomic History  . Marital status: Married    Spouse name: None  . Number of children: None  . Years of education: None  . Highest education level: None  Social Needs  . Financial resource strain: None  . Food insecurity - worry: None  . Food insecurity - inability: None  . Transportation needs - medical: None  . Transportation needs - non-medical: None  Occupational History  . None  Tobacco Use  . Smoking status: Former Smoker    Types: Cigars    Last attempt to quit: 06/12/2001    Years since quitting: 16.1  . Smokeless tobacco: Never Used  Substance and Sexual Activity  . Alcohol use: No    Alcohol/week: 0.0 oz  . Drug use: No  . Sexual activity: None  Other Topics Concern  . None  Social History Narrative  . None     Family History:  The patient's family history includes Cancer in his sister; Diabetes in his mother; Heart attack in his father.   ROS:   Please see the history of present illness.    ROS All other systems reviewed and are negative.   PHYSICAL EXAM:   VS:  BP (!) 154/73   Pulse 83   Ht $R'6\' 1"'qW$  (1.854 m)   Wt 300 lb (136.1 kg)   BMI 39.58 kg/m    Recheck blood pressure 128/71 mmHg  General: Alert, oriented x3, no distress, morbidly obese Head: no evidence of trauma, PERRL, EOMI, no exophtalmos or lid lag, no myxedema, no xanthelasma; normal ears, nose and oropharynx Neck: normal jugular venous pulsations and no  hepatojugular reflux; brisk carotid pulses without delay and no carotid bruits Chest: clear to auscultation, no signs of consolidation by percussion or palpation, normal fremitus, symmetrical and full respiratory excursions Cardiovascular: normal position and quality of the apical impulse, regular rhythm, normal first and widely split second heart sounds, no murmurs, rubs or gallops Abdomen: no tenderness or distention, no masses by palpation, no abnormal pulsatility or arterial bruits, normal bowel  sounds, no hepatosplenomegaly Extremities: no clubbing, cyanosis or edema; right below the knee amputation; 2+ radial, ulnar and brachial pulses bilaterally; ; 2+ left femoral, weak but present posterior tibial and dorsalis pedis pulses; no subclavian or femoral bruits Neurological: grossly nonfocal Psych: Normal mood and affect   Wt Readings from Last 3 Encounters:  07/17/17 300 lb (136.1 kg)  05/18/17 (!) 301 lb (136.5 kg)  09/17/15 264 lb 6 oz (119.9 kg)      Studies/Labs Reviewed:   EKG:  EKG is not ordered today.    Recent Labs: Most recent labs (May 29, 2017) show good control of his diabetes mellitus with a hemoglobin A1c of 6.8% and excellent cholesterol parameters with the exception of a mildly decreased HDL of 32.  She has significant chronic kidney disease with most recent creatinine of 2.7 (estimated GFR approximately 25).   ASSESSMENT:    1. Essential hypertension   2. OSA on CPAP   3. Diabetes mellitus type 2 in obese ([place])   4. CKD (chronic kidney disease) stage 4, GFR 15-29 ml/min (HCC)   5. Class 2 severe obesity due to excess calories with serious comorbidity and body mass index (BMI) of 39.0 to 39.9 in adult ([place])   6. Mixed hyperlipidemia   7. PAD (peripheral artery disease) (HCC)      PLAN:  In order of problems listed above:  1. HTN: Well-controlled.  Initial blood pressure was high, but after resting for several minutes it was well within target range of  less than 130/80 2. OSA: Compliant with CPAP, saw Dr. Claiborne Billings last December 3. DM: Good glycemic control 4. CKD: Followed by Dr. Moshe Cipro.  Recent labs showed GFR around 25 mL/minute 5. Obesity: He has made no progress with weight loss, but at least has not gained any weight since last year 6. HLP: All recent parameters within target range by recent labs, with the exception of the HDL cholesterol, which will not improve without substantial weight loss. 7. PAD s/p R BKA, very sedentary because of this   Medication Adjustments/Labs and Tests Ordered: Current medicines are reviewed at length with the patient today.  Concerns regarding medicines are outlined above.  Medication changes, Labs and Tests ordered today are listed in the Patient Instructions below. Patient Instructions  Dr Sallyanne Kuster recommends that you schedule a follow-up appointment in 12 months. You will receive a reminder letter in the mail two months in advance. If you don't receive a letter, please call our office to schedule the follow-up appointment.  If you need a refill on your cardiac medications before your next appointment, please call your pharmacy.    Signed, [name], MD  07/17/2017 12:12 PM    [address] [place], [place], [place]  [address] Phone: [phone]; Fax: [phone]

## 2017-07-17 NOTE — Patient Instructions (Signed)
Dr [name] recommends that you schedule a follow-up appointment in 12 months. You will receive a reminder letter in the mail two months in advance. If you don't receive a letter, please call our office to schedule the follow-up appointment.  If you need a refill on your cardiac medications before your next appointment, please call your pharmacy. 

## 2017-07-19 DIAGNOSIS — I129 Hypertensive chronic kidney disease with stage 1 through stage 4 chronic kidney disease, or unspecified chronic kidney disease: Secondary | ICD-10-CM | POA: Diagnosis not present

## 2017-07-19 DIAGNOSIS — E1122 Type 2 diabetes mellitus with diabetic chronic kidney disease: Secondary | ICD-10-CM | POA: Diagnosis not present

## 2017-07-19 DIAGNOSIS — I2510 Atherosclerotic heart disease of native coronary artery without angina pectoris: Secondary | ICD-10-CM | POA: Diagnosis not present

## 2017-07-19 DIAGNOSIS — E7800 Pure hypercholesterolemia, unspecified: Secondary | ICD-10-CM | POA: Diagnosis not present

## 2017-07-19 DIAGNOSIS — I69354 Hemiplegia and hemiparesis following cerebral infarction affecting left non-dominant side: Secondary | ICD-10-CM | POA: Diagnosis not present

## 2017-07-19 DIAGNOSIS — N183 Chronic kidney disease, stage 3 (moderate): Secondary | ICD-10-CM | POA: Diagnosis not present

## 2017-07-20 ENCOUNTER — Ambulatory Visit: Payer: Medicare Other | Admitting: Cardiovascular Disease

## 2017-07-25 DIAGNOSIS — H353132 Nonexudative age-related macular degeneration, bilateral, intermediate dry stage: Secondary | ICD-10-CM | POA: Diagnosis not present

## 2017-07-25 DIAGNOSIS — E113493 Type 2 diabetes mellitus with severe nonproliferative diabetic retinopathy without macular edema, bilateral: Secondary | ICD-10-CM | POA: Diagnosis not present

## 2017-07-25 DIAGNOSIS — H35372 Puckering of macula, left eye: Secondary | ICD-10-CM | POA: Diagnosis not present

## 2017-07-25 DIAGNOSIS — H35043 Retinal micro-aneurysms, unspecified, bilateral: Secondary | ICD-10-CM | POA: Diagnosis not present

## 2017-08-01 DIAGNOSIS — E7800 Pure hypercholesterolemia, unspecified: Secondary | ICD-10-CM | POA: Diagnosis not present

## 2017-08-01 DIAGNOSIS — E1122 Type 2 diabetes mellitus with diabetic chronic kidney disease: Secondary | ICD-10-CM | POA: Diagnosis not present

## 2017-08-01 DIAGNOSIS — I129 Hypertensive chronic kidney disease with stage 1 through stage 4 chronic kidney disease, or unspecified chronic kidney disease: Secondary | ICD-10-CM | POA: Diagnosis not present

## 2017-08-01 DIAGNOSIS — N183 Chronic kidney disease, stage 3 (moderate): Secondary | ICD-10-CM | POA: Diagnosis not present

## 2017-08-01 DIAGNOSIS — I2510 Atherosclerotic heart disease of native coronary artery without angina pectoris: Secondary | ICD-10-CM | POA: Diagnosis not present

## 2017-08-01 DIAGNOSIS — I69354 Hemiplegia and hemiparesis following cerebral infarction affecting left non-dominant side: Secondary | ICD-10-CM | POA: Diagnosis not present

## 2017-08-07 ENCOUNTER — Encounter: Payer: Self-pay | Admitting: Podiatry

## 2017-08-07 ENCOUNTER — Ambulatory Visit: Payer: Medicare Other | Admitting: Cardiovascular Disease

## 2017-08-07 ENCOUNTER — Ambulatory Visit (INDEPENDENT_AMBULATORY_CARE_PROVIDER_SITE_OTHER): Payer: Medicare Other | Admitting: Podiatry

## 2017-08-07 DIAGNOSIS — M79675 Pain in left toe(s): Secondary | ICD-10-CM | POA: Diagnosis not present

## 2017-08-07 DIAGNOSIS — E1142 Type 2 diabetes mellitus with diabetic polyneuropathy: Secondary | ICD-10-CM

## 2017-08-08 NOTE — Progress Notes (Signed)
Subjective:   Patient ID: Micheal Walls, male   DOB: 82 y.o.   MRN: [id number]   HPI Patient presents with nails 1-5 left that are incurvated with history of loss of right leg   ROS      Objective:  Physical Exam  At risk patient with incurvated nailbeds 1-5 left with loss of right leg secondary to infection     Assessment:  Mycotic nail infection with pain 1-5 left     Plan:  Debride painful nailbeds 1-5 left with no iatrogenic bleeding noted

## 2017-08-11 DIAGNOSIS — H01003 Unspecified blepharitis right eye, unspecified eyelid: Secondary | ICD-10-CM | POA: Diagnosis not present

## 2017-08-11 DIAGNOSIS — H04123 Dry eye syndrome of bilateral lacrimal glands: Secondary | ICD-10-CM | POA: Diagnosis not present

## 2017-08-11 DIAGNOSIS — H04213 Epiphora due to excess lacrimation, bilateral lacrimal glands: Secondary | ICD-10-CM | POA: Diagnosis not present

## 2017-08-11 DIAGNOSIS — H401132 Primary open-angle glaucoma, bilateral, moderate stage: Secondary | ICD-10-CM | POA: Diagnosis not present

## 2017-08-28 DIAGNOSIS — I129 Hypertensive chronic kidney disease with stage 1 through stage 4 chronic kidney disease, or unspecified chronic kidney disease: Secondary | ICD-10-CM | POA: Diagnosis not present

## 2017-08-28 DIAGNOSIS — E1121 Type 2 diabetes mellitus with diabetic nephropathy: Secondary | ICD-10-CM | POA: Diagnosis not present

## 2017-08-28 DIAGNOSIS — N184 Chronic kidney disease, stage 4 (severe): Secondary | ICD-10-CM | POA: Diagnosis not present

## 2017-08-28 DIAGNOSIS — Z89519 Acquired absence of unspecified leg below knee: Secondary | ICD-10-CM | POA: Diagnosis not present

## 2017-08-28 DIAGNOSIS — I739 Peripheral vascular disease, unspecified: Secondary | ICD-10-CM | POA: Diagnosis not present

## 2017-08-28 DIAGNOSIS — Z993 Dependence on wheelchair: Secondary | ICD-10-CM | POA: Diagnosis not present

## 2017-08-28 DIAGNOSIS — G4733 Obstructive sleep apnea (adult) (pediatric): Secondary | ICD-10-CM | POA: Diagnosis not present

## 2017-09-07 ENCOUNTER — Ambulatory Visit: Payer: Medicare Other | Admitting: Cardiovascular Disease

## 2017-09-07 ENCOUNTER — Ambulatory Visit (INDEPENDENT_AMBULATORY_CARE_PROVIDER_SITE_OTHER): Payer: Medicare Other | Admitting: Cardiovascular Disease

## 2017-09-07 ENCOUNTER — Encounter: Payer: Self-pay | Admitting: Cardiovascular Disease

## 2017-09-07 VITALS — BP 140/62 | HR 73 | Ht 73.0 in | Wt 300.0 lb

## 2017-09-07 DIAGNOSIS — I10 Essential (primary) hypertension: Secondary | ICD-10-CM

## 2017-09-07 DIAGNOSIS — Z9989 Dependence on other enabling machines and devices: Secondary | ICD-10-CM

## 2017-09-07 DIAGNOSIS — E669 Obesity, unspecified: Secondary | ICD-10-CM

## 2017-09-07 DIAGNOSIS — G4733 Obstructive sleep apnea (adult) (pediatric): Secondary | ICD-10-CM

## 2017-09-07 DIAGNOSIS — Z89511 Acquired absence of right leg below knee: Secondary | ICD-10-CM

## 2017-09-07 DIAGNOSIS — E1169 Type 2 diabetes mellitus with other specified complication: Secondary | ICD-10-CM

## 2017-09-07 NOTE — Patient Instructions (Signed)
  Follow-Up: Your physician wants you to follow-up in: 12 months with Dr. Claiborne Billings (sleep clinic). You will receive a reminder letter in the mail two months in advance. If you don't receive a letter, please call our office to schedule the follow-up appointment.

## 2017-09-07 NOTE — Progress Notes (Signed)
Cardiology Office Note    Date:  09/09/2017   ID:  Micheal Walls, DOB 03/10/1936, MRN [id number]  PCP:  Glendale Chard, MD  Cardiologist:  Shelva Majestic, MD (sleep); Cr. [place]  Chief Complaint  Patient presents with  . Follow-up    3 months   F/U sleep evaluation  History of Present Illness:  Micheal Walls is a 82 y.o. male who presents to the office today for re-evaluation of his sleep apnea after receiving a new CPAP machine.  Mr. Michael is a 82 year old Afro-American male who has a history of hypertension, diastolic heart failure, diabetes mellitus, and hyperlipidemia.  In 2011.  He was referred for a sleep study due to concerns for obstructive sleep apnea.  At that time, he had loud snoring, witnessed apnea, gasping for breath, and nonrestorative sleep.  A diagnostic polysomnogram done on 09/22/2009 showed moderate obstructive sleep apnea overall with an AHI of 23.46 per hour.  Sleep apnea was severe during rim sleep at 57.39 per hour.  He had significant oxygen desaturation to 82% with non-REM sleep and 50% with rems sleep.  There was moderate snoring.  He underwent CPAP titration and was titrated up to 11 7 m water pressure on 10/19/2009.  He has been utilizing CPAP therapy ever since and admits to 100% compliance with use.  Over the last several months, his machine had begun to make intermittent noises and started to intermittently malfunction.  Apparently, on May 20, 2017, the machine completely died and is no longer turns on for CPAP therapy.  His DME company is [employer].  His old machine is arrest Brontex CPAP unit.  His start date was 12/02/2009.  Presently, he goes to bed at 11 PM and wakes up at 9 AM.  He has 2-3 episodes of nocturia per night.  He is added onto my schedule today since his machine is no longer functional and he is in need for a new CPAP machine as soon as possible.   When I saw him in December 2018 an Epworth Sleepiness Scale score was  recalculated in the office today and this was elevated at 14 and was consistent with daytime sleepiness, particularly since his machine has been nonfunctional. He denied any chest pain, PND orthopnea.  He is  status post right BKA.  He has a history of chronic renal insufficiency.  He received a new ResMed air sense 10 AutoSet CPAP unit on June 02, 2017.  [employer] [facility] [employer] is his DME company.  Since receiving the new machine, he has resumed CPAP therapy with 100% compliance.  A download was obtained in the office today from August 07, 2017 through September 05, 2017.  Usage is 100% as is usage greater than 4 hours.  He is averaging 7 hours and 23 minutes of CPAP use per night.  AHI is 4.  His minimum pressure is set at 8 with a maximum of 20.  Review of his download indicates that he is having significant mask leak which may also be increasing his AHI.  He had been using very old nasal pillows.  As of yesterday he had received a replacement which is not reflective in this data.  He is unaware of residual snoring.  He denies bruxism.  He cannot exercise.  He often stays in a wheelchair.  He has a BKA.  As result at times he is sleepy during the day.  He calculated an Epworth score which shows sleepiness but I am not certain if  it was  completely accurate.  Past Medical History:  Diagnosis Date  . Amputated below knee ([place])    right  . Anemia   . Diastolic heart failure ([place])   . Diverticulosis   . DM (diabetes mellitus) ([place])   . Gout   . Hyperlipemia   . OSA on CPAP   . RBBB   . Systemic hypertension     Past Surgical History:  Procedure Laterality Date  . LEG AMPUTATION BELOW KNEE     right  . NM MYOCAR PERF WALL MOTION  09/18/2009   no ischemia    Current Medications: Outpatient Medications Prior to Visit  Medication Sig Dispense Refill  . allopurinol (ZYLOPRIM) 100 MG tablet Take 100 mg by mouth daily.    Marland Kitchen aspirin 81 MG tablet Take 162 mg by mouth daily.    . cephALEXin (KEFLEX)  500 MG capsule Take 1 capsule (500 mg total) by mouth 3 (three) times daily. 30 capsule 2  . colchicine 0.6 MG tablet Take 0.6 mg by mouth as needed (gout flare up).     . diltiazem (TIAZAC) 300 MG 24 hr capsule Take 300 mg by mouth daily.    Marland Kitchen econazole nitrate 1 % cream Apply topically 2 (two) times daily. Apply twice daily to web spaces toes 30 days 30 g 0  . ferrous sulfate 325 (65 FE) MG tablet Take 325 mg by mouth daily with breakfast.    . fish oil-omega-3 fatty acids 1000 MG capsule Take 2 g by mouth daily.    . furosemide (LASIX) 40 MG tablet Take 1 tablet (40 mg total) by mouth 2 (two) times daily. 180 tablet 3  . Insulin Aspart (NOVOLOG [place]) Inject 42-48 Units into the skin 2 (two) times daily. 48 in the am and 42 at night.    . Insulin Detemir (LEVEMIR FLEXTOUCH) 100 UNIT/ML SOPN Inject 20 Units into the skin at bedtime.    Marland Kitchen ketotifen (ALAWAY) 0.025 % ophthalmic solution Place 1 drop into both eyes 2 (two) times daily.    Marland Kitchen losartan (COZAAR) 100 MG tablet Take 1 tablet by mouth daily.    . Multiple Vitamin (MULTIVITAMIN) tablet Take 1 tablet by mouth daily.    . niacin (NIASPAN) 500 MG CR tablet Take 1,000 mg by mouth at bedtime.    . pantoprazole (PROTONIX) 40 MG tablet Take 40 mg by mouth daily.    . Polyvinyl Alcohol-Povidone (REFRESH OP) Apply 1 drop to eye 2 (two) times daily.    Marland Kitchen PRESCRIPTION MEDICATION Place 1 drop into both eyes 2 (two) times daily. For glucoma    . rosuvastatin (CRESTOR) 10 MG tablet Take 10 mg by mouth daily.     . tamsulosin (FLOMAX) 0.4 MG CAPS capsule Take 1 capsule by mouth daily.     No facility-administered medications prior to visit.      Allergies:   Patient has no known allergies.   Social History   Socioeconomic History  . Marital status: Married    Spouse name: Not on file  . Number of children: Not on file  . Years of education: Not on file  . Highest education level: Not on file  Occupational History  . Not on file  Social Needs    . Financial resource strain: Not on file  . Food insecurity:    Worry: Not on file    Inability: Not on file  . Transportation needs:    Medical: Not on file    Non-medical:  Not on file  Tobacco Use  . Smoking status: Former Smoker    Types: Cigars    Last attempt to quit: 06/12/2001    Years since quitting: 16.2  . Smokeless tobacco: Never Used  Substance and Sexual Activity  . Alcohol use: No    Alcohol/week: 0.0 oz  . Drug use: No  . Sexual activity: Not on file  Lifestyle  . Physical activity:    Days per week: Not on file    Minutes per session: Not on file  . Stress: Not on file  Relationships  . Social connections:    Talks on phone: Not on file    Gets together: Not on file    Attends religious service: Not on file    Active member of club or organization: Not on file    Attends meetings of clubs or organizations: Not on file    Relationship status: Not on file  Other Topics Concern  . Not on file  Social History Narrative  . Not on file     Family History:  The patient's family history includes Cancer in his sister; Diabetes in his mother; Heart attack in his father.   ROS General: Negative; No fevers, chills, or night sweats;  HEENT: Negative; No changes in vision or hearing, sinus congestion, difficulty swallowing Pulmonary: Negative; No cough, wheezing, shortness of breath, hemoptysis Cardiovascular: Negative; No chest pain, presyncope, syncope, palpitations GI: Negative; No nausea, vomiting, diarrhea, or abdominal pain GU: Negative; No dysuria, hematuria, or difficulty voiding Musculoskeletal: Positive for right below the knee amputation Hematologic/Oncology: Negative; no easy bruising, bleeding Endocrine: Positive for diabetes mellitus Neuro: Negative; no changes in balance, headaches Skin: Negative; No rashes or skin lesions Psychiatric: Negative; No behavioral problems, depression Sleep: Positive for OSA, snoring, daytime sleepiness,  hypersomnolence; No bruxism, restless legs, hypnogognic hallucinations, no cataplexy Other comprehensive 14 point system review is negative.   PHYSICAL EXAM:   VS:  BP 140/62 (BP Location: Left Arm, Patient Position: Sitting, Cuff Size: Large)   Pulse 73   Ht 6' 1"  (1.854 m)   Wt 300 lb (136.1 kg)   BMI 39.58 kg/m     Repeat blood pressure by me was 140/70.  Wt Readings from Last 3 Encounters:  09/07/17 300 lb (136.1 kg)  07/17/17 300 lb (136.1 kg)  05/18/17 (!) 301 lb (136.5 kg)    .General: Alert, oriented, no distress.  Skin: normal turgor, no rashes, warm and dry HEENT: Normocephalic, atraumatic. Pupils equal round and reactive to light; sclera anicteric; extraocular muscles intact; arcus senilis Nose without nasal septal hypertrophy Mouth/Parynx benign; Mallinpatti scale 4;  Neck: Thick neck ; no JVD, no carotid bruits; normal carotid upstroke Lungs: clear to ausculatation and percussion; no wheezing or rales Chest wall: without tenderness to palpitation Heart: PMI not displaced, RRR, s1 s2 normal, 1/6 systolic murmur, no diastolic murmur, no rubs, gallops, thrills, or heaves Abdomen: soft, nontender; no hepatosplenomehaly, BS+; abdominal aorta nontender and not dilated by palpation. Back: no CVA tenderness Pulses 2+ Musculoskeletal: full range of motion, normal strength, no joint deformities Extremities: Right BKA; trace edema left ankle no clubbing cyanosis, Homan's sign negative  Neurologic: grossly nonfocal; Cranial nerves grossly wnl Psychologic: Normal mood and affect    Studies/Labs Reviewed:   ECG (independently read by me): Normal sinus rhythm at 73 bpm.  Right bundle branch block with repolarization changes.  First-degree AV block with a PR interval at 276 ms.  Recent Labs: BMP Latest Ref Rng & Units  01/14/2011 01/13/2011 01/12/2011  Glucose 70 - 99 mg/dL 139(H) 138(H) 104(H)  BUN 6 - 23 mg/dL 36(H) 30(H) 27(H)  Creatinine 0.50 - 1.35 mg/dL 1.83(H) 1.79(H)  1.87(H)  Sodium 135 - 145 mEq/L 138 137 139  Potassium 3.5 - 5.1 mEq/L 4.1 4.0 4.0  Chloride 96 - 112 mEq/L 106 105 105  CO2 19 - 32 mEq/L 23 25 23   Calcium 8.4 - 10.5 mg/dL 8.4 8.5 8.7     Hepatic Function Latest Ref Rng & Units 01/09/2011 01/08/2011  Total Protein 6.0 - 8.3 g/dL 6.3 6.3  Albumin 3.5 - 5.2 g/dL 2.8(L) 2.8(L)  AST 0 - 37 U/L 18 20  ALT 0 - 53 U/L 16 15  Alk Phosphatase 39 - 117 U/L 41 43  Total Bilirubin 0.3 - 1.2 mg/dL 0.4 0.2(L)    CBC Latest Ref Rng & Units 01/14/2011 01/13/2011 01/12/2011  WBC 4.0 - 10.5 K/uL 10.3 10.4 -  Hemoglobin 13.0 - 17.0 g/dL 7.8(L) 8.1(L) 8.1(L)  Hematocrit 39.0 - 52.0 % 24.7(L) 25.2(L) 24.4(L)  Platelets 150 - 400 K/uL 196 184 -   Lab Results  Component Value Date   MCV 83.4 01/14/2011   MCV 82.9 01/13/2011   MCV 84.0 01/12/2011   Lab Results  Component Value Date   TSH 2.817 01/09/2011   Lab Results  Component Value Date   HGBA1C 6.7 (H) 01/09/2011     BNP No results found for: BNP  ProBNP    Component Value Date/Time   PROBNP 197.2 (H) 01/09/2011 0745     Lipid Panel  No results found for: CHOL, TRIG, HDL, CHOLHDL, VLDL, LDLCALC, LDLDIRECT   RADIOLOGY: No results found.   Additional studies/ records that were reviewed today include:  I reviewed the patient's diagnostic polysomnogram and CPAP titration trials from 2011.  I reviewed office records from Dr. Sallyanne Kuster.   I reviewed his most recent download.  ASSESSMENT:    1. OSA on CPAP   2. Essential hypertension   3. Diabetes mellitus type 2 in obese ([place])   4. Severe obesity (BMI 35.0-39.9) with comorbidity ([place])   5. S/P unilateral BKA (below knee amputation), right [facility])      PLAN:  Mr. Townsend Cudworth is a 82 year old African-American male who has significant cardiovascular comorbidities including systemic hypertension, diastolic heart failure, diabetes mellitus, hyperlipidemia, and is status post right BKA amputation.  He is been documented to have  obstructive sleep apnea since his initial diagnostic polysomnogram in April 2011.  He has been on CPAP therapy since his set up date on 12/02/2009.  On his initial evaluation, his overall AHI was 23.46/h but during REM sleep he had severe sleep apnea with an AHI of 57.39/h.  He had significant oxygen desaturation to a nadir of 50% during REM sleep.  His old machine had malfunction.  He recently received a new ResMed air sense 10 AutoSet CPAP unit with set up date June 02, 2017.  He has noticed marked improvement with his new machine compared to his old one.  I reviewed his download after obtaining this in the office.  He is 100% compliant.  AHI is good at 4 but I suspect this may be artifactually elevated due to his daily mask leak.   His old nasal pillows were extremely soft and undoubtedly contributed to a significant leak.  He just received new pillows yesterday.  A new download will be obtained in 1 month to see if there is improvement.  Of just about 40 he  is diabetic.  His blood pressure today is controlled on losartan 100 mg.  He continues to be on rosuvastatin and niacin for hyperlipidemia.  I stressed the importance of weight loss as well as opportunity to try to get some type of activity.  He remains fairly sedentary.   He is morbidly obese with a BMI at approximately 40.  He is diabetic.  His blood pressure is controlled on diltiazem 300 mg, losartan 100 mg.  He is on combination therapy with rosuvastatin in addition to niacin.  I will see him in 1 year or sooner problems arise.   Evaluate medication Adjustments/Labs and Tests Ordered: Current medicines are reviewed at length with the patient today.  Concerns regarding medicines are outlined above.  Medication changes, Labs and Tests ordered today are listed in the Patient Instructions below. Patient Instructions   Follow-Up: Your physician wants you to follow-up in: 12 months with Dr. Claiborne Billings (sleep clinic). You will receive a reminder letter in  the mail two months in advance. If you don't receive a letter, please call our office to schedule the follow-up appointment.       Signed, Shelva Majestic, MD  09/09/2017 5:08 PM    [facility] [address] [place], [place], [place]  [address] Phone: [phone]

## 2017-09-08 ENCOUNTER — Other Ambulatory Visit: Payer: Self-pay | Admitting: Cardiovascular Disease

## 2017-09-08 DIAGNOSIS — Z9989 Dependence on other enabling machines and devices: Principal | ICD-10-CM

## 2017-09-08 DIAGNOSIS — G4733 Obstructive sleep apnea (adult) (pediatric): Secondary | ICD-10-CM

## 2017-09-09 ENCOUNTER — Encounter: Payer: Self-pay | Admitting: Cardiovascular Disease

## 2017-09-20 ENCOUNTER — Telehealth: Payer: Self-pay | Admitting: *Deleted

## 2017-09-20 NOTE — Telephone Encounter (Deleted)
Faxed nasal pioow

## 2017-09-20 NOTE — Telephone Encounter (Signed)
Faxed nasal pillow order to [facility].

## 2017-10-25 ENCOUNTER — Ambulatory Visit: Payer: Medicare Other | Attending: Internal Medicine | Admitting: Physical Therapy

## 2017-10-25 DIAGNOSIS — M6281 Muscle weakness (generalized): Secondary | ICD-10-CM | POA: Diagnosis not present

## 2017-10-25 DIAGNOSIS — R2689 Other abnormalities of gait and mobility: Secondary | ICD-10-CM | POA: Diagnosis not present

## 2017-10-25 DIAGNOSIS — R2681 Unsteadiness on feet: Secondary | ICD-10-CM | POA: Insufficient documentation

## 2017-10-26 ENCOUNTER — Other Ambulatory Visit: Payer: Self-pay

## 2017-10-26 ENCOUNTER — Encounter: Payer: Self-pay | Admitting: Physical Therapy

## 2017-10-26 NOTE — Therapy (Signed)
[facility] [address] [place], [place], [address] Phone: [phone]   Fax:  [phone]  Physical Therapy Evaluation  Patient Details  Name: Micheal Walls MRN: [id number] Date of Birth: 04/19/1936 Referring Provider: [name], [name]   Encounter Date: 10/25/2017  PT End of Session - 10/26/17 1139    Visit Number  1    Authorization Type  Medicare/ [employer]    Authorization Time Period  10-25-17 - 11-25-17    PT Start Time  1355    PT Stop Time  1506    PT Time Calculation (min)  71 min       Past Medical History:  Diagnosis Date  . Amputated below knee ([place])    right  . Anemia   . Diastolic heart failure ([place])   . Diverticulosis   . DM (diabetes mellitus) ([place])   . Gout   . Hyperlipemia   . OSA on CPAP   . RBBB   . Systemic hypertension     Past Surgical History:  Procedure Laterality Date  . LEG AMPUTATION BELOW KNEE     right  . NM MYOCAR PERF WALL MOTION  09/18/2009   no ischemia    There were no vitals filed for this visit.   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  No/denies         [facility] PT Assessment - 10/26/17 0001      Assessment   Medical Diagnosis  s/p Rt Transtibial amputation; s/p CVA with Lt hemiparesis    Referring Provider  [name], [name]    Onset Date/Surgical Date  02/26/13 for CVA:      Precautions   Precautions  Fall      Balance Screen   Has the patient fallen in the past 6 months  No    Has the patient had a decrease in activity level because of a fear of falling?   No    Is the patient reluctant to leave their home because of a fear  of falling?   No      Prior Function   Level of Independence  Independent with basic ADLs;Other (comment) uses power wheelchair in home for mobility                Objective measurements completed on examination: See above findings.                           Plan - 10/26/17 1151    Clinical Impression Statement  Pt is an 66 yr gentleman s/p Rt BKA, h/o CVA, and type 2 IDDM who presents for power wheelchair evaluation.  [employer], [employer] with [employer] present for eval. He has recommended power wheelchair with power tilt and recline for independence with pressure relief and to assist in decreasing edema in LLE.      History and Personal Factors relevant to plan of care:  h/o CVA; Rt BKA 2000    Clinical Presentation due to:  Rt BKA 2000    PT Frequency  One time visit w/chair eval  only    PT Treatment/Interventions  Other (comment) wheelchair management    Consulted and Agree with Plan of Care  Patient       Patient will benefit from skilled therapeutic intervention in order to improve the following deficits and impairments:  Difficulty walking, Decreased balance, Dizziness, Hypomobility  Visit Diagnosis: Unsteadiness on feet - Plan: PT plan of care cert/re-cert  Muscle weakness (generalized) - Plan: PT plan of care cert/re-cert  Other abnormalities of gait and mobility - Plan: PT plan of care cert/re-cert     Problem List Patient Active Problem List   Diagnosis Date Noted  . CKD (chronic kidney disease) stage 4, GFR 15-29 ml/min (HCC) 07/17/2017  . PAD (peripheral artery disease) ([place]) 07/17/2017  . 110.1 10/21/2013  . S/P unilateral BKA (below knee amputation) ([place]) 03/17/2013  . Diabetes mellitus type 2 in obese ([place]) 03/17/2013  . Hyperlipidemia 03/17/2013  . HTN (hypertension) 03/17/2013  . Gout 03/17/2013  . CKD (chronic kidney disease) stage 3, GFR 30-59 ml/min (HCC) 03/17/2013  . OSA on CPAP 03/17/2013  . Obesity with serious  comorbidity 03/17/2013  . Anemia, iron deficiency 03/17/2013  . Diverticulosis of colon 03/17/2013  . RBBB 03/17/2013    Alda Lea, PT 10/26/2017, 12:07 PM  [facility] [address] [place], [place], [address] Phone: [phone]   Fax:  [phone]  Name: Micheal Walls MRN: [id number] Date of Birth: 1935/07/05

## 2017-11-07 ENCOUNTER — Other Ambulatory Visit: Payer: 59

## 2017-11-10 ENCOUNTER — Ambulatory Visit (INDEPENDENT_AMBULATORY_CARE_PROVIDER_SITE_OTHER): Payer: Medicare Other | Admitting: Podiatry

## 2017-11-10 ENCOUNTER — Encounter: Payer: Self-pay | Admitting: Podiatry

## 2017-11-10 DIAGNOSIS — M79675 Pain in left toe(s): Secondary | ICD-10-CM

## 2017-11-10 DIAGNOSIS — B351 Tinea unguium: Secondary | ICD-10-CM | POA: Diagnosis not present

## 2017-11-13 NOTE — Progress Notes (Signed)
Subjective:   Patient ID: Micheal Walls, male   DOB: 82 y.o.   MRN: [id number]   HPI Patient presents with thickened nails 1-5 left after having had amputation of the right leg with high risk patient   ROS      Objective:  Physical Exam  Thick yellow brittle nailbeds 1-5 left that are incurvated and tender with history of amputation right leg     Assessment:  Chronic mycotic nail infection 1-5 left     Plan:  Debrided nailbeds 1-5 left with no iatrogenic bleeding noted

## 2017-11-17 DIAGNOSIS — H04123 Dry eye syndrome of bilateral lacrimal glands: Secondary | ICD-10-CM | POA: Diagnosis not present

## 2017-11-17 DIAGNOSIS — H04213 Epiphora due to excess lacrimation, bilateral lacrimal glands: Secondary | ICD-10-CM | POA: Diagnosis not present

## 2017-11-17 DIAGNOSIS — H401132 Primary open-angle glaucoma, bilateral, moderate stage: Secondary | ICD-10-CM | POA: Diagnosis not present

## 2017-11-17 DIAGNOSIS — H01003 Unspecified blepharitis right eye, unspecified eyelid: Secondary | ICD-10-CM | POA: Diagnosis not present

## 2017-11-21 DIAGNOSIS — H35372 Puckering of macula, left eye: Secondary | ICD-10-CM | POA: Diagnosis not present

## 2017-11-21 DIAGNOSIS — E113493 Type 2 diabetes mellitus with severe nonproliferative diabetic retinopathy without macular edema, bilateral: Secondary | ICD-10-CM | POA: Diagnosis not present

## 2017-11-21 DIAGNOSIS — H353132 Nonexudative age-related macular degeneration, bilateral, intermediate dry stage: Secondary | ICD-10-CM | POA: Diagnosis not present

## 2017-11-21 DIAGNOSIS — H35361 Drusen (degenerative) of macula, right eye: Secondary | ICD-10-CM | POA: Diagnosis not present

## 2017-11-27 DIAGNOSIS — E1121 Type 2 diabetes mellitus with diabetic nephropathy: Secondary | ICD-10-CM | POA: Diagnosis not present

## 2017-11-27 DIAGNOSIS — Z89519 Acquired absence of unspecified leg below knee: Secondary | ICD-10-CM | POA: Diagnosis not present

## 2017-11-27 DIAGNOSIS — N184 Chronic kidney disease, stage 4 (severe): Secondary | ICD-10-CM | POA: Diagnosis not present

## 2017-11-27 DIAGNOSIS — R209 Unspecified disturbances of skin sensation: Secondary | ICD-10-CM | POA: Diagnosis not present

## 2017-11-27 DIAGNOSIS — I129 Hypertensive chronic kidney disease with stage 1 through stage 4 chronic kidney disease, or unspecified chronic kidney disease: Secondary | ICD-10-CM | POA: Diagnosis not present

## 2017-11-27 DIAGNOSIS — N08 Glomerular disorders in diseases classified elsewhere: Secondary | ICD-10-CM | POA: Diagnosis not present

## 2017-11-27 DIAGNOSIS — Z1389 Encounter for screening for other disorder: Secondary | ICD-10-CM | POA: Diagnosis not present

## 2017-12-18 DIAGNOSIS — H04123 Dry eye syndrome of bilateral lacrimal glands: Secondary | ICD-10-CM | POA: Diagnosis not present

## 2017-12-18 DIAGNOSIS — H401132 Primary open-angle glaucoma, bilateral, moderate stage: Secondary | ICD-10-CM | POA: Diagnosis not present

## 2017-12-18 DIAGNOSIS — H40053 Ocular hypertension, bilateral: Secondary | ICD-10-CM | POA: Diagnosis not present

## 2017-12-18 DIAGNOSIS — H04213 Epiphora due to excess lacrimation, bilateral lacrimal glands: Secondary | ICD-10-CM | POA: Diagnosis not present

## 2017-12-18 DIAGNOSIS — H40051 Ocular hypertension, right eye: Secondary | ICD-10-CM | POA: Diagnosis not present

## 2017-12-18 DIAGNOSIS — H401112 Primary open-angle glaucoma, right eye, moderate stage: Secondary | ICD-10-CM | POA: Diagnosis not present

## 2017-12-20 DIAGNOSIS — H401132 Primary open-angle glaucoma, bilateral, moderate stage: Secondary | ICD-10-CM | POA: Diagnosis not present

## 2017-12-20 DIAGNOSIS — H40053 Ocular hypertension, bilateral: Secondary | ICD-10-CM | POA: Diagnosis not present

## 2017-12-20 DIAGNOSIS — H04213 Epiphora due to excess lacrimation, bilateral lacrimal glands: Secondary | ICD-10-CM | POA: Diagnosis not present

## 2017-12-20 DIAGNOSIS — H04123 Dry eye syndrome of bilateral lacrimal glands: Secondary | ICD-10-CM | POA: Diagnosis not present

## 2017-12-25 DIAGNOSIS — N183 Chronic kidney disease, stage 3 (moderate): Secondary | ICD-10-CM | POA: Diagnosis not present

## 2017-12-25 DIAGNOSIS — I129 Hypertensive chronic kidney disease with stage 1 through stage 4 chronic kidney disease, or unspecified chronic kidney disease: Secondary | ICD-10-CM | POA: Diagnosis not present

## 2017-12-25 DIAGNOSIS — Z6841 Body Mass Index (BMI) 40.0 and over, adult: Secondary | ICD-10-CM | POA: Diagnosis not present

## 2017-12-25 DIAGNOSIS — E118 Type 2 diabetes mellitus with unspecified complications: Secondary | ICD-10-CM | POA: Diagnosis not present

## 2017-12-25 DIAGNOSIS — M109 Gout, unspecified: Secondary | ICD-10-CM | POA: Diagnosis not present

## 2017-12-25 DIAGNOSIS — E785 Hyperlipidemia, unspecified: Secondary | ICD-10-CM | POA: Diagnosis not present

## 2017-12-25 DIAGNOSIS — N3289 Other specified disorders of bladder: Secondary | ICD-10-CM | POA: Diagnosis not present

## 2018-01-01 DIAGNOSIS — H40052 Ocular hypertension, left eye: Secondary | ICD-10-CM | POA: Diagnosis not present

## 2018-01-01 DIAGNOSIS — H401122 Primary open-angle glaucoma, left eye, moderate stage: Secondary | ICD-10-CM | POA: Diagnosis not present

## 2018-01-11 ENCOUNTER — Ambulatory Visit (INDEPENDENT_AMBULATORY_CARE_PROVIDER_SITE_OTHER): Payer: Medicare Other | Admitting: Neurology

## 2018-01-11 ENCOUNTER — Encounter (INDEPENDENT_AMBULATORY_CARE_PROVIDER_SITE_OTHER): Payer: Self-pay | Admitting: Neurology

## 2018-01-11 DIAGNOSIS — Z0289 Encounter for other administrative examinations: Secondary | ICD-10-CM

## 2018-01-11 DIAGNOSIS — M79673 Pain in unspecified foot: Secondary | ICD-10-CM

## 2018-01-16 NOTE — Progress Notes (Signed)
Patient with HTN PAD, DM2, CKD, right left BKA here for evaluation of sensory changes in his left foot which is significantly swollen, erythema. Could not perform emg/ncs, leg extremely edematous. Would be technically difficult to get accurrate NCS, and EMG needle exam would be difficult due to excessive fluid leakage on needle exam and would place patient at risk for infections given erythema and skin findings. Discussed with patient's referring physician, may consider his edema and chronic medical conditions as causes for his sensory changes in his foot which doesn't need an emg/ncs to verify. Also patient denied radicular symptoms or pain.

## 2018-02-02 DIAGNOSIS — H40053 Ocular hypertension, bilateral: Secondary | ICD-10-CM | POA: Diagnosis not present

## 2018-02-02 DIAGNOSIS — E113493 Type 2 diabetes mellitus with severe nonproliferative diabetic retinopathy without macular edema, bilateral: Secondary | ICD-10-CM | POA: Diagnosis not present

## 2018-02-02 DIAGNOSIS — H35372 Puckering of macula, left eye: Secondary | ICD-10-CM | POA: Diagnosis not present

## 2018-02-02 DIAGNOSIS — H401132 Primary open-angle glaucoma, bilateral, moderate stage: Secondary | ICD-10-CM | POA: Diagnosis not present

## 2018-02-02 LAB — HM DIABETES EYE EXAM

## 2018-02-09 ENCOUNTER — Ambulatory Visit: Payer: 59 | Admitting: Podiatry

## 2018-02-16 ENCOUNTER — Ambulatory Visit: Payer: 59 | Admitting: Podiatry

## 2018-02-27 LAB — CBC AND DIFFERENTIAL
Neutrophils Absolute: 3
Platelets: 156 (ref 150–399)
WBC: 6.1

## 2018-02-27 LAB — BASIC METABOLIC PANEL
BUN: 67 — AB (ref 4–21)
Creatinine: 2.8 — AB (ref 0.6–1.3)
Glucose: 158
Potassium: 5.2 (ref 3.4–5.3)
Sodium: 144 (ref 137–147)

## 2018-02-27 LAB — HEPATIC FUNCTION PANEL
ALT: 20 (ref 10–40)
AST: 22 (ref 14–40)
Alkaline Phosphatase: 64 (ref 25–125)

## 2018-02-27 LAB — VITAMIN B12: Vitamin B-12: 957

## 2018-02-27 LAB — LIPID PANEL
Cholesterol: 102 (ref 0–200)
HDL: 36 (ref 35–70)
LDL Cholesterol: 54
LDl/HDL Ratio: 1.5
Triglycerides: 61 (ref 40–160)

## 2018-02-27 LAB — HEMOGLOBIN A1C: Hgb A1c MFr Bld: 6.7 — AB (ref 4.0–6.0)

## 2018-02-27 LAB — TSH: TSH: 3.24 (ref 0.41–5.90)

## 2018-02-28 ENCOUNTER — Encounter: Payer: Self-pay | Admitting: Nurse Practitioner

## 2018-02-28 DIAGNOSIS — R209 Unspecified disturbances of skin sensation: Secondary | ICD-10-CM | POA: Insufficient documentation

## 2018-02-28 DIAGNOSIS — N289 Disorder of kidney and ureter, unspecified: Secondary | ICD-10-CM | POA: Insufficient documentation

## 2018-03-15 ENCOUNTER — Ambulatory Visit (INDEPENDENT_AMBULATORY_CARE_PROVIDER_SITE_OTHER): Payer: Medicare Other

## 2018-03-15 ENCOUNTER — Other Ambulatory Visit: Payer: Self-pay

## 2018-03-15 ENCOUNTER — Encounter: Payer: Self-pay | Admitting: Nurse Practitioner

## 2018-03-15 ENCOUNTER — Ambulatory Visit (INDEPENDENT_AMBULATORY_CARE_PROVIDER_SITE_OTHER): Payer: Medicare Other | Admitting: Nurse Practitioner

## 2018-03-15 ENCOUNTER — Other Ambulatory Visit: Payer: Self-pay | Admitting: Nurse Practitioner

## 2018-03-15 VITALS — BP 158/62 | HR 83 | Temp 97.9°F | Ht 73.0 in | Wt 300.0 lb

## 2018-03-15 VITALS — BP 158/62 | HR 83 | Temp 97.9°F | Ht 73.0 in

## 2018-03-15 DIAGNOSIS — N183 Chronic kidney disease, stage 3 unspecified: Secondary | ICD-10-CM

## 2018-03-15 DIAGNOSIS — E669 Obesity, unspecified: Secondary | ICD-10-CM

## 2018-03-15 DIAGNOSIS — G4733 Obstructive sleep apnea (adult) (pediatric): Secondary | ICD-10-CM

## 2018-03-15 DIAGNOSIS — Z79899 Other long term (current) drug therapy: Secondary | ICD-10-CM | POA: Diagnosis not present

## 2018-03-15 DIAGNOSIS — E782 Mixed hyperlipidemia: Secondary | ICD-10-CM

## 2018-03-15 DIAGNOSIS — I739 Peripheral vascular disease, unspecified: Secondary | ICD-10-CM

## 2018-03-15 DIAGNOSIS — N289 Disorder of kidney and ureter, unspecified: Secondary | ICD-10-CM | POA: Diagnosis not present

## 2018-03-15 DIAGNOSIS — Z9989 Dependence on other enabling machines and devices: Secondary | ICD-10-CM

## 2018-03-15 DIAGNOSIS — Z0000 Encounter for general adult medical examination without abnormal findings: Secondary | ICD-10-CM

## 2018-03-15 DIAGNOSIS — I10 Essential (primary) hypertension: Secondary | ICD-10-CM | POA: Diagnosis not present

## 2018-03-15 DIAGNOSIS — M1A40X0 Other secondary chronic gout, unspecified site, without tophus (tophi): Secondary | ICD-10-CM

## 2018-03-15 DIAGNOSIS — I129 Hypertensive chronic kidney disease with stage 1 through stage 4 chronic kidney disease, or unspecified chronic kidney disease: Secondary | ICD-10-CM | POA: Diagnosis not present

## 2018-03-15 DIAGNOSIS — E1169 Type 2 diabetes mellitus with other specified complication: Secondary | ICD-10-CM

## 2018-03-15 DIAGNOSIS — Z89511 Acquired absence of right leg below knee: Secondary | ICD-10-CM | POA: Diagnosis not present

## 2018-03-15 DIAGNOSIS — Z89519 Acquired absence of unspecified leg below knee: Secondary | ICD-10-CM

## 2018-03-15 LAB — POCT URINALYSIS DIPSTICK
Bilirubin, UA: NEGATIVE
Blood, UA: NEGATIVE
Glucose, UA: NEGATIVE
Ketones, UA: NEGATIVE
Leukocytes, UA: NEGATIVE
Nitrite, UA: NEGATIVE
Protein, UA: NEGATIVE
Spec Grav, UA: 1.015 (ref 1.010–1.025)
Urobilinogen, UA: 0.2 E.U./dL
pH, UA: 5.5 (ref 5.0–8.0)

## 2018-03-15 LAB — POCT UA - MICROALBUMIN
Albumin/Creatinine Ratio, Urine, POC: <30
Creatinine, POC: 100 mg/dL
Microalbumin Ur, POC: 30 mg/L

## 2018-03-15 MED ORDER — PANTOPRAZOLE SODIUM 40 MG PO TBEC: 40.0000 mg | DELAYED_RELEASE_TABLET | Freq: Every day | ORAL | 1 refills | Status: DC

## 2018-03-15 MED ORDER — ROSUVASTATIN CALCIUM 10 MG PO TABS: 10.0000 mg | ORAL_TABLET | Freq: Every day | ORAL | 1 refills | Status: DC

## 2018-03-15 NOTE — Patient Instructions (Signed)
Micheal Walls , Thank you for taking time to come for your Medicare Wellness Visit. I appreciate your ongoing commitment to your health goals. Please review the following plan we discussed and let me know if I can assist you in the future.   Screening recommendations/referrals: Colonoscopy: no longer required Recommended yearly ophthalmology/optometry visit for glaucoma screening and checkup Recommended yearly dental visit for hygiene and checkup  Vaccinations: Influenza vaccine: declined Pneumococcal vaccine: up to date Tdap vaccine: up to date, due 01/2023 Shingles vaccine: declined    Advanced directives: Yes. Asked to bring in copy   Conditions/risks identified: Obesity. Non-ambulatory  Next appointment: 03/15/2018 at 9:30a  Preventive Care 65 Years and Older, Male Preventive care refers to lifestyle choices and visits with your health care provider that can promote health and wellness. What does preventive care include?  A yearly physical exam. This is also called an annual well check.  Dental exams once or twice a year.  Routine eye exams. Ask your health care provider how often you should have your eyes checked.  Personal lifestyle choices, including:  Daily care of your teeth and gums.  Regular physical activity.  Eating a healthy diet.  Avoiding tobacco and drug use.  Limiting alcohol use.  Practicing safe sex.  Taking low doses of aspirin every day.  Taking vitamin and mineral supplements as recommended by your health care provider. What happens during an annual well check? The services and screenings done by your health care provider during your annual well check will depend on your age, overall health, lifestyle risk factors, and family history of disease. Counseling  Your health care provider may ask you questions about your:  Alcohol use.  Tobacco use.  Drug use.  Emotional well-being.  Home and relationship well-being.  Sexual  activity.  Eating habits.  History of falls.  Memory and ability to understand (cognition).  Work and work Statistician. Screening  You may have the following tests or measurements:  Height, weight, and BMI.  Blood pressure.  Lipid and cholesterol levels. These may be checked every 5 years, or more frequently if you are over 66 years old.  Skin check.  Lung cancer screening. You may have this screening every year starting at age 74 if you have a 30-pack-year history of smoking and currently smoke or have quit within the past 15 years.  Fecal occult blood test (FOBT) of the stool. You may have this test every year starting at age 84.  Flexible sigmoidoscopy or colonoscopy. You may have a sigmoidoscopy every 5 years or a colonoscopy every 10 years starting at age 67.  Prostate cancer screening. Recommendations will vary depending on your family history and other risks.  Hepatitis C blood test.  Hepatitis B blood test.  Sexually transmitted disease (STD) testing.  Diabetes screening. This is done by checking your blood sugar (glucose) after you have not eaten for a while (fasting). You may have this done every 1-3 years.  Abdominal aortic aneurysm (AAA) screening. You may need this if you are a current or former smoker.  Osteoporosis. You may be screened starting at age 12 if you are at high risk. Talk with your health care provider about your test results, treatment options, and if necessary, the need for more tests. Vaccines  Your health care provider may recommend certain vaccines, such as:  Influenza vaccine. This is recommended every year.  Tetanus, diphtheria, and acellular pertussis (Tdap, Td) vaccine. You may need a Td booster every 10 years.  Zoster vaccine. You may need this after age 1.  Pneumococcal 13-valent conjugate (PCV13) vaccine. One dose is recommended after age 105.  Pneumococcal polysaccharide (PPSV23) vaccine. One dose is recommended after age  40. Talk to your health care provider about which screenings and vaccines you need and how often you need them. This information is not intended to replace advice given to you by your health care provider. Make sure you discuss any questions you have with your health care provider. Document Released: 06/26/2015 Document Revised: 02/17/2016 Document Reviewed: 03/31/2015 Elsevier Interactive Patient Education  2017 [place] Prevention in the Home Falls can cause injuries. They can happen to people of all ages. There are many things you can do to make your home safe and to help prevent falls. What can I do on the outside of my home?  Regularly fix the edges of walkways and driveways and fix any cracks.  Remove anything that might make you trip as you walk through a door, such as a raised step or threshold.  Trim any bushes or trees on the path to your home.  Use bright outdoor lighting.  Clear any walking paths of anything that might make someone trip, such as rocks or tools.  Regularly check to see if handrails are loose or broken. Make sure that both sides of any steps have handrails.  Any raised decks and porches should have guardrails on the edges.  Have any leaves, snow, or ice cleared regularly.  Use sand or salt on walking paths during winter.  Clean up any spills in your garage right away. This includes oil or grease spills. What can I do in the bathroom?  Use night lights.  Install grab bars by the toilet and in the tub and shower. Do not use towel bars as grab bars.  Use non-skid mats or decals in the tub or shower.  If you need to sit down in the shower, use a plastic, non-slip stool.  Keep the floor dry. Clean up any water that spills on the floor as soon as it happens.  Remove soap buildup in the tub or shower regularly.  Attach bath mats securely with double-sided non-slip rug tape.  Do not have throw rugs and other things on the floor that can make  you trip. What can I do in the bedroom?  Use night lights.  Make sure that you have a light by your bed that is easy to reach.  Do not use any sheets or blankets that are too big for your bed. They should not hang down onto the floor.  Have a firm chair that has side arms. You can use this for support while you get dressed.  Do not have throw rugs and other things on the floor that can make you trip. What can I do in the kitchen?  Clean up any spills right away.  Avoid walking on wet floors.  Keep items that you use a lot in easy-to-reach places.  If you need to reach something above you, use a strong step stool that has a grab bar.  Keep electrical cords out of the way.  Do not use floor polish or wax that makes floors slippery. If you must use wax, use non-skid floor wax.  Do not have throw rugs and other things on the floor that can make you trip. What can I do with my stairs?  Do not leave any items on the stairs.  Make sure that there are  handrails on both sides of the stairs and use them. Fix handrails that are broken or loose. Make sure that handrails are as long as the stairways.  Check any carpeting to make sure that it is firmly attached to the stairs. Fix any carpet that is loose or worn.  Avoid having throw rugs at the top or bottom of the stairs. If you do have throw rugs, attach them to the floor with carpet tape.  Make sure that you have a light switch at the top of the stairs and the bottom of the stairs. If you do not have them, ask someone to add them for you. What else can I do to help prevent falls?  Wear shoes that:  Do not have high heels.  Have rubber bottoms.  Are comfortable and fit you well.  Are closed at the toe. Do not wear sandals.  If you use a stepladder:  Make sure that it is fully opened. Do not climb a closed stepladder.  Make sure that both sides of the stepladder are locked into place.  Ask someone to hold it for you, if  possible.  Clearly mark and make sure that you can see:  Any grab bars or handrails.  First and last steps.  Where the edge of each step is.  Use tools that help you move around (mobility aids) if they are needed. These include:  Canes.  Walkers.  Scooters.  Crutches.  Turn on the lights when you go into a dark area. Replace any light bulbs as soon as they burn out.  Set up your furniture so you have a clear path. Avoid moving your furniture around.  If any of your floors are uneven, fix them.  If there are any pets around you, be aware of where they are.  Review your medicines with your doctor. Some medicines can make you feel dizzy. This can increase your chance of falling. Ask your doctor what other things that you can do to help prevent falls. This information is not intended to replace advice given to you by your health care provider. Make sure you discuss any questions you have with your health care provider. Document Released: 03/26/2009 Document Revised: 11/05/2015 Document Reviewed: 07/04/2014 Elsevier Interactive Patient Education  2017 [employer].

## 2018-03-15 NOTE — Progress Notes (Signed)
Subjective:     Patient ID: Micheal Walls , male    DOB: 1935-10-03 , 82 y.o.   MRN: [id number]   Diabetes  He presents for his follow-up diabetic visit. He has type 2 diabetes mellitus. No MedicAlert identification noted. Pertinent negatives for diabetes include no chest pain. Diabetic complications include PVD. He is following a diabetic diet. When asked about meal planning, he reported none. He has not had a previous visit with a dietitian. He rarely participates in exercise. His bedtime blood glucose range is generally 90-110 mg/dl. His overall blood glucose range is 110-130 mg/dl. (This am blood sugar was 77 and 90 last pm) He sees a podiatrist (Next appt tomorrow).Eye exam is current (Last eye exam in July - signed for medical release for copy of records.).  Hypertension  This is a chronic problem. The current episode started more than 1 year ago. The problem is unchanged. The problem is controlled. Pertinent negatives include no chest pain or peripheral edema. There are no associated agents to hypertension. Risk factors for coronary artery disease include diabetes mellitus, dyslipidemia, obesity and sedentary lifestyle. Past treatments include beta blockers. There are no compliance problems.  Hypertensive end-organ damage includes PVD. There is no history of angina. Identifiable causes of hypertension include chronic renal disease and sleep apnea.     Past Medical History:  Diagnosis Date  . Amputated below knee ([place])    right  . Anemia   . Diastolic heart failure ([place])   . Diverticulosis   . DM (diabetes mellitus) ([place])   . Gout   . Hyperlipemia   . OSA on CPAP   . RBBB   . Systemic hypertension       Current Outpatient Medications:  .  allopurinol (ZYLOPRIM) 100 MG tablet, Take 100 mg by mouth daily., Disp: , Rfl:  .  antiseptic oral rinse (BIOTENE) LIQD, 15 mLs by Mouth Rinse route as needed for dry mouth., Disp: , Rfl:  .  aspirin 81 MG tablet, Take 162 mg by mouth  daily., Disp: , Rfl:  .  colchicine 0.6 MG tablet, Take 0.6 mg by mouth as needed (gout flare up). , Disp: , Rfl:  .  diltiazem (TIAZAC) 300 MG 24 hr capsule, Take 300 mg by mouth daily., Disp: , Rfl:  .  econazole nitrate 1 % cream, Apply topically 2 (two) times daily. Apply twice daily to web spaces toes 30 days, Disp: 30 g, Rfl: 0 .  ferrous sulfate 325 (65 FE) MG tablet, Take 325 mg by mouth daily with breakfast., Disp: , Rfl:  .  fish oil-omega-3 fatty acids 1000 MG capsule, Take 2 g by mouth daily., Disp: , Rfl:  .  furosemide (LASIX) 40 MG tablet, Take 1 tablet (40 mg total) by mouth 2 (two) times daily., Disp: 180 tablet, Rfl: 3 .  hydrocortisone (ANUSOL-HC) 25 MG suppository, Place 25 mg rectally 2 (two) times daily as needed for hemorrhoids or anal itching., Disp: , Rfl:  .  Insulin Aspart (NOVOLOG [place]), Inject 42-48 Units into the skin 2 (two) times daily. 48 in the am and 42 at night., Disp: , Rfl:  .  Insulin Detemir (LEVEMIR FLEXTOUCH) 100 UNIT/ML SOPN, Inject 20 Units into the skin at bedtime., Disp: , Rfl:  .  ketotifen (ALAWAY) 0.025 % ophthalmic solution, Place 1 drop into both eyes 2 (two) times daily., Disp: , Rfl:  .  Lancet Devices (MICROLET NEXT LANCING DEVICE) MISC, by Does not apply route. Check blood sugar  3 times per day, Disp: , Rfl:  .  losartan (COZAAR) 100 MG tablet, Take 1 tablet by mouth daily., Disp: , Rfl:  .  Multiple Vitamin (MULTIVITAMIN) tablet, Take 1 tablet by mouth daily., Disp: , Rfl:  .  niacin (NIASPAN) 500 MG CR tablet, Take 1,000 mg by mouth at bedtime., Disp: , Rfl:  .  pantoprazole (PROTONIX) 40 MG tablet, Take 40 mg by mouth daily., Disp: , Rfl:  .  Polyvinyl Alcohol-Povidone (REFRESH OP), Apply 1 drop to eye 2 (two) times daily., Disp: , Rfl:  .  pramoxine-hydrocortisone (PRAMOSONE) cream, Apply topically 3 (three) times daily. Apply topically to hemorrhoids four times daily as needed after bowel movements, for external use only, Disp: , Rfl:  .   PRESCRIPTION MEDICATION, Place 1 drop into both eyes 2 (two) times daily. For glucoma, Disp: , Rfl:  .  rosuvastatin (CRESTOR) 10 MG tablet, Take 10 mg by mouth daily. , Disp: , Rfl:  .  Semaglutide,0.25 or 0.5MG/DOS, (OZEMPIC, 0.25 OR 0.5 MG/DOSE,) 2 MG/1.5ML SOPN, Inject into the skin. Inject 0.10m by subcutaneous route every week on the same day of each week in the abdomen,thighs or upper arm sites, Disp: , Rfl:  .  tamsulosin (FLOMAX) 0.4 MG CAPS capsule, Take 1 capsule by mouth daily., Disp: , Rfl:    Review of Systems  Constitutional: Negative.   HENT: Positive for hearing loss.        Wears hearing aid to right ear and unable to hear anything in left ear thought to be related to work.  Respiratory: Negative.   Cardiovascular: Negative.  Negative for chest pain.  Gastrointestinal: Negative.   Endocrine: Negative.   Genitourinary: Negative.   Musculoskeletal: Positive for arthralgias.       Left shoulder pain with reaching out.    Skin: Negative.   Allergic/Immunologic: Negative.   Neurological: Negative.   Hematological: Negative.   Psychiatric/Behavioral: Negative.      Today's Vitals   03/15/18 1015  BP: (!) 158/62  Pulse: 83  Temp: 97.9 F (36.6 C)  TempSrc: Oral  SpO2: 94%  Height: 6' 1"  (1.854 m)  PainSc: 0-No pain   Body mass index is 39.58 kg/m.   Objective:  Physical Exam  Constitutional: He appears well-developed and well-nourished.  Morbid obese  HENT:  Head: Normocephalic.  Right Ear: External ear normal. No decreased hearing is noted.  Left Ear: External ear normal. No decreased hearing is noted.  Right ear with hearing aid present  Eyes: Lids are normal.  Neck: Neck supple. Carotid bruit is not present. Edema present.  Cardiovascular: Normal rate, regular rhythm, S1 normal, S2 normal and normal pulses.  Pulmonary/Chest: Effort normal and breath sounds normal. No respiratory distress.  Abdominal: Soft. Normal appearance and bowel sounds are normal.   Musculoskeletal:       Left shoulder: He exhibits decreased range of motion, tenderness and pain. He exhibits no swelling, no crepitus and normal strength.  Feet:  Right Foot: amputated       Assessment And Plan:     1. Diabetes mellitus type 2 in obese ([facility]  Chronic  Doing well with ozempic, tolerating well  Blood sugars are improved per patient  Continue current medications  Patient declined influenza vaccination at this time. Patient is aware that influenza vaccine prevents illness in 70% of healthy people, and reduces hospitalizations to 30-70% in elderly. This vaccine is recommended annually. Pt is willing to accept risk associated with refusing vaccination. - CBC with  Differential/Platelet - MBO4+QTTC - Liver Profile - Hemoglobin A1c - Lipid Profile - TSH  2. Mixed hyperlipidemia  Chronic, stable  Continue current medications - CBC with Differential/Platelet - BMP8+eGFR - Liver Profile - Hemoglobin A1c - Lipid Profile - TSH  3. Essential hypertension  Chronic, fair control  Continue current medications - CBC with Differential/Platelet - BMP8+eGFR - Liver Profile - Hemoglobin A1c - Lipid Profile - TSH  4. CKD (chronic kidney disease) stage 3, GFR 30-59 ml/min ([facility])  Likely related to long term hypertension and diabetes - CBC with Differential/Platelet - BMP8+eGFR - Liver Profile - Hemoglobin A1c - Lipid Profile - TSH  5. 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 OSA on CPAP  Chronic, wears nightly and benefits from using CPAP  - CBC with Differential/Platelet - BMP8+eGFR - Liver Profile - Hemoglobin A1c - Lipid Profile - TSH  8. S/P unilateral BKA  (below knee amputation) (HCC)  Related to PAD - CBC with Differential/Platelet - BMP8+eGFR - Liver Profile - Hemoglobin A1c - Lipid Profile - TSH  9. PAD (peripheral artery disease) ([facility])  Taking ASA daily, this is likely what causes his foot discomfort - CBC with Differential/Platelet - BMP8+eGFR - Liver Profile - Hemoglobin A1c - Lipid Profile - TSH  10. Encounter for long-term (current) use of medications  - CBC with Differential/Platelet - BMP8+eGFR - Liver Profile - Hemoglobin A1c - Lipid Profile - TSH    [name], [name]

## 2018-03-15 NOTE — Patient Instructions (Signed)
1. Patient declined influenza vaccination at this time. Patient is aware that influenza vaccine prevents illness in 70% of healthy people, and reduces hospitalizations to 30-70% in elderly. This vaccine is recommended annually. Pt is willing to accept risk associated with refusing vaccination.

## 2018-03-15 NOTE — Progress Notes (Signed)
Subjective:   Micheal Walls is a 82 y.o. male who presents for Medicare Annual/Subsequent preventive examination.  Review of Systems:  N/A Cardiac Risk Factors include: advanced age (>83men, >84 women);diabetes mellitus;hypertension;sedentary lifestyle;obesity (BMI >30kg/m2)     Objective:    Vitals: BP (!) 158/62 (BP Location: Left Arm)   Pulse 83   Temp 97.9 F (36.6 C)   Ht $R'6\' 1"'Od$  (1.854 m)   Wt 300 lb (136.1 kg)   SpO2 94%   BMI 39.58 kg/m   Body mass index is 39.58 kg/m.  Advanced Directives 03/15/2018 02/23/2015  Does Patient Have a Medical Advance Directive? Yes No  Type of Advance Directive Living will -  Does patient want to make changes to medical advance directive? No - Patient declined -  Would patient like information on creating a medical advance directive? - No - patient declined information    Tobacco Social History   Tobacco Use  Smoking Status Former Smoker  . Types: Cigars  . Last attempt to quit: 06/12/2001  . Years since quitting: 16.7  Smokeless Tobacco Never Used     Counseling given: Not Answered   Clinical Intake:  Pre-visit preparation completed: Yes  Pain : No/denies pain Pain Score: 0-No pain     Nutritional Status: BMI > 30  Obese Nutritional Risks: None Diabetes: Yes CBG done?: No Did pt. bring in CBG monitor from home?: No  How often do you need to have someone help you when you read instructions, pamphlets, or other written materials from your doctor or pharmacy?: 1 - Never What is the last grade level you completed in school?: 12 th grade  Interpreter Needed?: No  Information entered by :: NAllen LPN  Past Medical History:  Diagnosis Date  . 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 Anemia   . Diastolic heart failure ([place])   . Diverticulosis   . DM (diabetes mellitus) ([place])   . Gout   . Hyperlipemia   . OSA on CPAP   . RBBB   . Systemic hypertension    Past Surgical History:  Procedure Laterality Date  .  LEG AMPUTATION BELOW KNEE     right  . NM MYOCAR PERF WALL MOTION  09/18/2009   no ischemia   Family History  Problem Relation Age of Onset  . Diabetes Mother   . Heart attack Father   . Cancer Sister    Social History   Socioeconomic History  . Marital status: Married    Spouse name: Not on file  . Number of children: Not on file  . Years of education: Not on file  . Highest education level: Not on file  Occupational History  . Occupation: retired  Scientific laboratory technician  . Financial resource strain: Not hard at all  . Food insecurity:    Worry: Never true    Inability: Never true  . Transportation needs:    Medical: No    Non-medical: No  Tobacco Use  . Smoking status: Former Smoker    Types: Cigars    Last attempt to quit: 06/12/2001    Years since quitting: 16.7  . Smokeless tobacco: Never Used  Substance and Sexual Activity  . Alcohol use: No    Alcohol/week: 0.0 standard drinks  . Drug use: No  . Sexual activity: Yes  Lifestyle  . Physical activity:    Days per week: 0 days    Minutes per session: 0 min  . Stress: Not at  all  Relationships  . Social connections:    Talks on phone: Not on file    Gets together: Not on file    Attends religious service: Not on file    Active member of club or organization: Not on file    Attends meetings of clubs or organizations: Not on file    Relationship status: Not on file  Other Topics Concern  . Not on file  Social History Narrative  . Not on file    Outpatient Encounter Medications as of 03/15/2018  Medication Sig  . allopurinol (ZYLOPRIM) 100 MG tablet Take 100 mg by mouth daily.  Marland Kitchen antiseptic oral rinse (BIOTENE) LIQD 15 mLs by Mouth Rinse route as needed for dry mouth.  Marland Kitchen aspirin 81 MG tablet Take 162 mg by mouth daily.  . colchicine 0.6 MG tablet Take 0.6 mg by mouth as needed (gout flare up).   . diltiazem (TIAZAC) 300 MG 24 hr capsule Take 300 mg by mouth daily.  Marland Kitchen econazole nitrate 1 % cream Apply topically 2  (two) times daily. Apply twice daily to web spaces toes 30 days  . ferrous sulfate 325 (65 FE) MG tablet Take 325 mg by mouth daily with breakfast.  . fish oil-omega-3 fatty acids 1000 MG capsule Take 2 g by mouth daily.  . furosemide (LASIX) 40 MG tablet Take 1 tablet (40 mg total) by mouth 2 (two) times daily.  . hydrocortisone (ANUSOL-HC) 25 MG suppository Place 25 mg rectally 2 (two) times daily as needed for hemorrhoids or anal itching.  . Insulin Aspart (NOVOLOG [place]) Inject 42-48 Units into the skin 2 (two) times daily. 48 in the am and 42 at night.  . Insulin Detemir (LEVEMIR FLEXTOUCH) 100 UNIT/ML SOPN Inject 20 Units into the skin at bedtime.  Marland Kitchen ketotifen (ALAWAY) 0.025 % ophthalmic solution Place 1 drop into both eyes 2 (two) times daily.  Elmore Guise Devices ([place] NEXT LANCING DEVICE) MISC by Does not apply route. Check blood sugar 3 times per day  . losartan (COZAAR) 100 MG tablet Take 1 tablet by mouth daily.  . Multiple Vitamin (MULTIVITAMIN) tablet Take 1 tablet by mouth daily.  . niacin (NIASPAN) 500 MG CR tablet Take 1,000 mg by mouth at bedtime.  . Polyvinyl Alcohol-Povidone (REFRESH OP) Apply 1 drop to eye 2 (two) times daily.  Marland Kitchen PRESCRIPTION MEDICATION Place 1 drop into both eyes 2 (two) times daily. For glucoma  . Semaglutide,0.25 or 0.5MG /DOS, (OZEMPIC, 0.25 OR 0.5 MG/DOSE,) 2 MG/1.5ML SOPN Inject into the skin. Inject 0.5mg  by subcutaneous route every week on the same day of each week in the abdomen,thighs or upper arm sites  . tamsulosin (FLOMAX) 0.4 MG CAPS capsule Take 1 capsule by mouth daily.  . [DISCONTINUED] cephALEXin (KEFLEX) 500 MG capsule Take 1 capsule (500 mg total) by mouth 3 (three) times daily.  . [DISCONTINUED] pantoprazole (PROTONIX) 40 MG tablet Take 40 mg by mouth daily.  . [DISCONTINUED] rosuvastatin (CRESTOR) 10 MG tablet Take 10 mg by mouth daily.   . pramoxine-hydrocortisone (PRAMOSONE) cream Apply topically 3 (three) times daily. Apply topically to  hemorrhoids four times daily as needed after bowel movements, for external use only   No facility-administered encounter medications on file as of 03/15/2018.     Activities of Daily Living In your present state of health, do you have any difficulty performing the following activities: 03/15/2018  Hearing? N  Vision? Y  Comment Trouble seeing far away  Difficulty concentrating or making decisions? N  Walking or climbing stairs? Y  Comment Non Ambulatory. uses wheel chair  Dressing or bathing? N  Doing errands, shopping? N  Preparing Food and eating ? N  Using the Toilet? N  In the past six months, have you accidently leaked urine? N  Do you have problems with loss of bowel control? N  Managing your Medications? N  Managing your Finances? N  Housekeeping or managing your Housekeeping? N  Some recent data might be hidden    Patient Care Team: Glendale Chard, MD as PCP - General (Internal Medicine)   Assessment:   This is a routine wellness examination for [place].  Exercise Activities and Dietary recommendations Current Exercise Habits: The patient does not participate in regular exercise at present, Exercise limited by: orthopedic condition(s)  Goals   None     Fall Risk Fall Risk  03/15/2018  Falls in the past year? Exclusion - non ambulatory  Risk for fall due to : Impaired mobility;Impaired balance/gait;Medication side effect   Is the patient's home free of loose throw rugs in walkways, pet beds, electrical cords, etc?   yes      Grab bars in the bathroom? no      Handrails on the stairs?   n/a      Adequate lighting?   yes  Timed Get Up and Go Performed: N/A  Depression Screen PHQ 2/9 Scores 03/15/2018  PHQ - 2 Score 0  PHQ- 9 Score 2    Cognitive Function     6CIT Screen 03/15/2018  What Year? 0 points  What month? 0 points  What time? 0 points  Count back from 20 0 points  Months in reverse 0 points  Repeat phrase 2 points  Total Score 2     Immunization History  Administered Date(s) Administered  . Tdap 02/04/2013    Qualifies for Shingles Vaccine? declines  Screening Tests Health Maintenance  Topic Date Due  . OPHTHALMOLOGY EXAM  10/25/1945  . PNA vac Low Risk Adult (1 of 2 - PCV13) 10/25/2002  . INFLUENZA VACCINE  03/16/2019 (Originally 01/11/2018)  . HEMOGLOBIN A1C  08/28/2018  . FOOT EXAM  03/16/2019  . TETANUS/TDAP  02/05/2023   Cancer Screenings: Lung: Low Dose CT Chest recommended if Age 49-80 years, 30 pack-year currently smoking OR have quit w/in 15years. Patient does not qualify. Colorectal: up to date  Additional Screenings:  Hepatitis C Screening:n/a      Plan:    Patient had an eye exam in July. Patient does not have any goals for himself.  I have personally reviewed and noted the following in the patient's chart:   . Medical and social history . Use of alcohol, tobacco or illicit drugs  . Current medications and supplements . Functional ability and status . Nutritional status . Physical activity . Advanced directives . List of other physicians . Hospitalizations, surgeries, and ER visits in previous 12 months . Vitals . Screenings to include cognitive, depression, and falls . Referrals and appointments  In addition, I have reviewed and discussed with patient certain preventive protocols, quality metrics, and best practice recommendations. A written personalized care plan for preventive services as well as general preventive health recommendations were provided to patient.     Kellie Simmering, LPN  16/06/958

## 2018-03-16 ENCOUNTER — Encounter: Payer: Self-pay | Admitting: Podiatry

## 2018-03-16 ENCOUNTER — Ambulatory Visit (INDEPENDENT_AMBULATORY_CARE_PROVIDER_SITE_OTHER): Payer: Medicare Other | Admitting: Podiatry

## 2018-03-16 DIAGNOSIS — B351 Tinea unguium: Secondary | ICD-10-CM

## 2018-03-16 DIAGNOSIS — B353 Tinea pedis: Secondary | ICD-10-CM | POA: Diagnosis not present

## 2018-03-16 DIAGNOSIS — E1151 Type 2 diabetes mellitus with diabetic peripheral angiopathy without gangrene: Secondary | ICD-10-CM

## 2018-03-16 DIAGNOSIS — E1142 Type 2 diabetes mellitus with diabetic polyneuropathy: Secondary | ICD-10-CM

## 2018-03-16 LAB — TSH: TSH: 2.64 u[IU]/mL (ref 0.450–4.500)

## 2018-03-16 LAB — HEPATIC FUNCTION PANEL
ALT: 23 IU/L (ref 0–44)
AST: 23 IU/L (ref 0–40)
Albumin: 3.7 g/dL (ref 3.5–4.7)
Alkaline Phosphatase: 63 IU/L (ref 39–117)
Bilirubin Total: 0.4 mg/dL (ref 0.0–1.2)
Bilirubin, Direct: 0.17 mg/dL (ref 0.00–0.40)
Total Protein: 6.7 g/dL (ref 6.0–8.5)

## 2018-03-16 LAB — BMP8+EGFR
BUN/Creatinine Ratio: 26 — ABNORMAL HIGH (ref 10–24)
BUN: 54 mg/dL — ABNORMAL HIGH (ref 8–27)
CO2: 21 mmol/L (ref 20–29)
Calcium: 10 mg/dL (ref 8.6–10.2)
Chloride: 104 mmol/L (ref 96–106)
Creatinine, Ser: 2.11 mg/dL — ABNORMAL HIGH (ref 0.76–1.27)
GFR calc Af Amer: 33 mL/min/{1.73_m2} — ABNORMAL LOW (ref >59)
GFR calc non Af Amer: 29 mL/min/{1.73_m2} — ABNORMAL LOW (ref >59)
Glucose: 91 mg/dL (ref 65–99)
Potassium: 4 mmol/L (ref 3.5–5.2)
Sodium: 140 mmol/L (ref 134–144)

## 2018-03-16 LAB — CBC WITH DIFFERENTIAL/PLATELET
Basophils Absolute: 0 10*3/uL (ref 0.0–0.2)
Basos: 0 %
EOS (ABSOLUTE): 0.2 10*3/uL (ref 0.0–0.4)
Eos: 4 %
Hematocrit: 32.5 % — ABNORMAL LOW (ref 37.5–51.0)
Hemoglobin: 10.6 g/dL — ABNORMAL LOW (ref 13.0–17.7)
Immature Grans (Abs): 0 10*3/uL (ref 0.0–0.1)
Immature Granulocytes: 0 %
Lymphocytes Absolute: 1.8 10*3/uL (ref 0.7–3.1)
Lymphs: 38 %
MCH: 29.4 pg (ref 26.6–33.0)
MCHC: 32.6 g/dL (ref 31.5–35.7)
MCV: 90 fL (ref 79–97)
Monocytes Absolute: 0.3 10*3/uL (ref 0.1–0.9)
Monocytes: 7 %
Neutrophils Absolute: 2.5 10*3/uL (ref 1.4–7.0)
Neutrophils: 51 %
Platelets: 139 10*3/uL — ABNORMAL LOW (ref 150–450)
RBC: 3.61 x10E6/uL — ABNORMAL LOW (ref 4.14–5.80)
RDW: 16.5 % — ABNORMAL HIGH (ref 12.3–15.4)
WBC: 4.8 10*3/uL (ref 3.4–10.8)

## 2018-03-16 LAB — LIPID PANEL
Chol/HDL Ratio: 2 ratio (ref 0.0–5.0)
Cholesterol, Total: 81 mg/dL — ABNORMAL LOW (ref 100–199)
HDL: 40 mg/dL (ref >39)
LDL Calculated: 33 mg/dL (ref 0–99)
Triglycerides: 39 mg/dL (ref 0–149)
VLDL Cholesterol Cal: 8 mg/dL (ref 5–40)

## 2018-03-16 LAB — HEMOGLOBIN A1C
Est. average glucose Bld gHb Est-mCnc: 128 mg/dL
Hgb A1c MFr Bld: 6.1 % — ABNORMAL HIGH (ref 4.8–5.6)

## 2018-03-16 MED ORDER — CASTELLANI PAINT MODIFIED 1.5 % EX LIQD: CUTANEOUS | 0 refills | Status: DC

## 2018-03-16 NOTE — Patient Instructions (Signed)
Athlete's Foot Athlete's foot (tinea pedis) is a fungal infection of the skin on the feet. It often occurs on the skin that is between or underneath the toes. It can also occur on the soles of the feet. The infection can spread from person to person (is contagious). What are the causes? Athlete's foot is caused by a fungus. This fungus grows in warm, moist places. Most people get athlete's foot by sharing shower stalls, towels, and wet floors with someone who is infected. Not washing your feet or changing your socks often enough can contribute to athlete's foot. What increases the risk? This condition is more likely to develop in:  Men.  People who have a weak body defense system (immune system).  People who have diabetes.  People who use public showers, such as at a gym.  People who wear heavy-duty shoes, such as Environmental manager.  Seasons with warm, humid weather.  What are the signs or symptoms? Symptoms of this condition include:  Itchy areas between the toes or on the soles of the feet.  White, flaky, or scaly areas between the toes or on the soles of the feet.  Very itchy small blisters between the toes or on the soles of the feet.  Small cuts on the skin. These cuts can become infected.  Thick or discolored toenails.  How is this diagnosed? This condition is diagnosed with a medical history and physical exam. Your health care provider may also take a skin or toenail sample to be examined. How is this treated? Treatment for this condition includes antifungal medicines. These may be applied as powders, ointments, or creams. In severe cases, an oral antifungal medicine may be given. Follow these instructions at home:  Apply or take over-the-counter and prescription medicines only as told by your health care provider.  Keep all follow-up visits as told by your health care provider. This is important.  Do not scratch your feet.  Keep your feet dry: ? Wear  cotton or wool socks. Change your socks every day or if they become wet. ? Wear shoes that allow air to circulate, such as sandals or canvas tennis shoes.  Wash and dry your feet: ? Every day or as told by your health care provider. ? After exercising. ? Including the area between your toes.  Do not share towels, nail clippers, or other personal items that touch your feet with others.  If you have diabetes, keep your blood sugar under control. How is this prevented?  Do not share towels.  Wear sandals in wet areas, such as locker rooms and shared showers.  Keep your feet dry: ? Wear cotton or wool socks. Change your socks every day or if they become wet. ? Wear shoes that allow air to circulate, such as sandals or canvas tennis shoes.  Wash and dry your feet after exercising. Pay attention to the area between your toes. Contact a health care provider if:  You have a fever.  You have swelling, soreness, warmth, or redness in your foot.  You are not getting better with treatment.  Your symptoms get worse.  You have new symptoms. This information is not intended to replace advice given to you by your health care provider. Make sure you discuss any questions you have with your health care provider. Document Released: 05/27/2000 Document Revised: 11/05/2015 Document Reviewed: 12/01/2014 Elsevier Interactive Patient Education  2018 [employer].

## 2018-03-16 NOTE — Progress Notes (Signed)
Subjective: Micheal Walls presents to clinic today for at-risk diabetic foot care. He has h/o below knee amputation right LE with diabetes, diabetic neuropathy and PAD .  He is here for preventative foot care  with cc of painful, discolored, thick toenails left foot. Pain is relieved with periodic professional debridement.   Patient also states he is out of "cream" for moist area between his toes on the left foot. He denies any breaks in skin, drainage, redness or pain. Denies any open wounds.   Objective: Vascular Examination: Capillary refill time delayed left foot Dorsalis pedis and Posterior tibial pulses absent left LE No digital hair x 5 digits left foot Skin temperature warm to cool b/l  Dermatological Examination: Skin thin,  atrophic left LE Toenails 1-5 left foot discolored, thick, dystrophic with subungual debris and pain with palpation to nailbeds due to thickness of nails. Interdigital maceration noted webspaces 2, 3, 4 left foot. No breaks in skin, no drainage, no blistering, no peeling. No open wounds nor signs of deep space infection.    Musculoskeletal: Utilized motorized chair for mobility. Able to self transfer. Muscle strength 5/5 to all LE muscle groups  Neurological: Sensation diminished with 10 gram monofilament. Vibratory sensation diminished  Assessment: 1. Painful onychomycosis toenails 1-5 left foot 2. Interdigital tinea pedis left foot 3. NIDDM with Peripheral arterial disease 4. Diabetic neuropathy  Plan: 1. Discuss tinea pedis and treatment options. Educational Literature dispensed to patient. Castellanis Paint applied to webspaces 2, 3, 4 left foot to assist in drying macerated webspaces.  Rx sent to pharmacy for modified Castellani's Paint to be applied between toes 2, 3, 4, 5 left foot once daily. 2. Toenails 1-5 left foot were debrided in length and girth without iatrogenic bleeding. 3. Patient to continue soft, supportive shoe  gear 4. Patient to report any pedal injuries to medical professional. 5.  Follow up 3 weels. Patient/POA to call should there be a concern in the interim.

## 2018-03-20 DIAGNOSIS — E113493 Type 2 diabetes mellitus with severe nonproliferative diabetic retinopathy without macular edema, bilateral: Secondary | ICD-10-CM | POA: Diagnosis not present

## 2018-03-20 DIAGNOSIS — H35372 Puckering of macula, left eye: Secondary | ICD-10-CM | POA: Diagnosis not present

## 2018-03-20 DIAGNOSIS — H353132 Nonexudative age-related macular degeneration, bilateral, intermediate dry stage: Secondary | ICD-10-CM | POA: Diagnosis not present

## 2018-03-20 DIAGNOSIS — H35361 Drusen (degenerative) of macula, right eye: Secondary | ICD-10-CM | POA: Diagnosis not present

## 2018-04-05 ENCOUNTER — Other Ambulatory Visit: Payer: Self-pay | Admitting: Nurse Practitioner

## 2018-04-09 ENCOUNTER — Ambulatory Visit: Payer: Medicare Other | Admitting: Podiatry

## 2018-04-12 ENCOUNTER — Encounter: Payer: Self-pay | Admitting: Podiatry

## 2018-04-12 ENCOUNTER — Ambulatory Visit (INDEPENDENT_AMBULATORY_CARE_PROVIDER_SITE_OTHER): Payer: Medicare Other | Admitting: Podiatry

## 2018-04-12 DIAGNOSIS — Z89511 Acquired absence of right leg below knee: Secondary | ICD-10-CM

## 2018-04-12 DIAGNOSIS — B353 Tinea pedis: Secondary | ICD-10-CM | POA: Diagnosis not present

## 2018-04-12 DIAGNOSIS — B351 Tinea unguium: Secondary | ICD-10-CM

## 2018-04-12 DIAGNOSIS — E1151 Type 2 diabetes mellitus with diabetic peripheral angiopathy without gangrene: Secondary | ICD-10-CM | POA: Diagnosis not present

## 2018-04-12 MED ORDER — CASTELLANI PAINT MODIFIED 1.5 % EX LIQD: CUTANEOUS | 1 refills | Status: DC

## 2018-04-12 NOTE — Patient Instructions (Signed)
Athlete's Foot Athlete's foot (tinea pedis) is a fungal infection of the skin on the feet. It often occurs on the skin that is between or underneath the toes. It can also occur on the soles of the feet. The infection can spread from person to person (is contagious). What are the causes? Athlete's foot is caused by a fungus. This fungus grows in warm, moist places. Most people get athlete's foot by sharing shower stalls, towels, and wet floors with someone who is infected. Not washing your feet or changing your socks often enough can contribute to athlete's foot. What increases the risk? This condition is more likely to develop in:  Men.  People who have a weak body defense system (immune system).  People who have diabetes.  People who use public showers, such as at a gym.  People who wear heavy-duty shoes, such as Environmental manager.  Seasons with warm, humid weather.  What are the signs or symptoms? Symptoms of this condition include:  Itchy areas between the toes or on the soles of the feet.  White, flaky, or scaly areas between the toes or on the soles of the feet.  Very itchy small blisters between the toes or on the soles of the feet.  Small cuts on the skin. These cuts can become infected.  Thick or discolored toenails.  How is this diagnosed? This condition is diagnosed with a medical history and physical exam. Your health care provider may also take a skin or toenail sample to be examined. How is this treated? Treatment for this condition includes antifungal medicines. These may be applied as powders, ointments, or creams. In severe cases, an oral antifungal medicine may be given. Follow these instructions at home:  Apply or take over-the-counter and prescription medicines only as told by your health care provider.  Keep all follow-up visits as told by your health care provider. This is important.  Do not scratch your feet.  Keep your feet dry: ? Wear  cotton or wool socks. Change your socks every day or if they become wet. ? Wear shoes that allow air to circulate, such as sandals or canvas tennis shoes.  Wash and dry your feet: ? Every day or as told by your health care provider. ? After exercising. ? Including the area between your toes.  Do not share towels, nail clippers, or other personal items that touch your feet with others.  If you have diabetes, keep your blood sugar under control. How is this prevented?  Do not share towels.  Wear sandals in wet areas, such as locker rooms and shared showers.  Keep your feet dry: ? Wear cotton or wool socks. Change your socks every day or if they become wet. ? Wear shoes that allow air to circulate, such as sandals or canvas tennis shoes.  Wash and dry your feet after exercising. Pay attention to the area between your toes. Contact a health care provider if:  You have a fever.  You have swelling, soreness, warmth, or redness in your foot.  You are not getting better with treatment.  Your symptoms get worse.  You have new symptoms. This information is not intended to replace advice given to you by your health care provider. Make sure you discuss any questions you have with your health care provider. Document Released: 05/27/2000 Document Revised: 11/05/2015 Document Reviewed: 12/01/2014 Elsevier Interactive Patient Education  2018 [employer].

## 2018-04-20 ENCOUNTER — Other Ambulatory Visit: Payer: Self-pay

## 2018-04-20 MED ORDER — INSULIN DETEMIR 100 UNIT/ML FLEXPEN: 20.0000 [IU] | PEN_INJECTOR | Freq: Every day | SUBCUTANEOUS | 1 refills | Status: DC

## 2018-04-23 ENCOUNTER — Other Ambulatory Visit: Payer: Self-pay | Admitting: Nurse Practitioner

## 2018-04-24 DIAGNOSIS — H04123 Dry eye syndrome of bilateral lacrimal glands: Secondary | ICD-10-CM | POA: Diagnosis not present

## 2018-04-24 DIAGNOSIS — H40053 Ocular hypertension, bilateral: Secondary | ICD-10-CM | POA: Diagnosis not present

## 2018-04-24 DIAGNOSIS — H04213 Epiphora due to excess lacrimation, bilateral lacrimal glands: Secondary | ICD-10-CM | POA: Diagnosis not present

## 2018-04-24 DIAGNOSIS — H401132 Primary open-angle glaucoma, bilateral, moderate stage: Secondary | ICD-10-CM | POA: Diagnosis not present

## 2018-04-26 ENCOUNTER — Other Ambulatory Visit: Payer: Self-pay

## 2018-04-30 NOTE — Progress Notes (Signed)
Subjective: Micheal Walls presents today for preventative foot care follow-up.  He has history of below-knee amputation of the right lower extremity, diabetic neuropathy, and PAD.  He is seen routinely for debridement of painful, mycotic toenails.  Pain is aggravated when wearing enclosed shoe gear and relieved with periodic professional debridement.   Objective: . 82 year old male in no acute distress.  Alert awake and oriented x3  Vascular Examination: Capillary refill time noted to be delayed to the left foot Dorsalis pedis and posterior tibial pulses are absent left lower extremity no digital hair x5 digits of the left lower extremity.  Skin temperature gradient warm to cool left lower extremity  Dermatological Examination: Skin of the left lower extremity is noted to be thin and atrophic. Toenails 1-5 left foot discolored, thick, dystrophic with subungual debris and pain with palpation to nailbeds due to thickness of nails. Interdigital macerations noted which is slightly improved from last visit webspaces 2, 3, 4 left foot.  No breaks in skin, no drainage, no blistering, no peeling.  No open wounds or signs of deep space infection noted  Musculoskeletal: Muscle strength 5/5, muscle groups of left lower extremity Utilizes motorized chair for mobility.  He is able to transfer on his own.  Neurological: Sensation with 10 gram monofilament is diminished Vibratory sensation is diminished  Assessment: 1. Painful onychomycosis toenails 1 through 5 left foot 2. Interdigital tinea pedis slightly improved left foot 3. NIDDM with peripheral arterial disease and peripheral neuropathy 4. Status post right below-knee amputation  Plan: 1. Discussed tinea pedis.  He has had some improvement in the degree of his interdigital maceration.  I have refilled his Castellani's pain and he is to continue to apply between the toes of the left foot once daily. 2. Toenails 1-5 left foot were debrided  in length and girth without iatrogenic bleeding. 3. Patient to continue soft, supportive shoe gear 4. Patient to report any pedal injuries to medical professional  5. Follow up 3 months. Patient/POA to call should there be a concern in the interim.

## 2018-05-08 ENCOUNTER — Telehealth: Payer: Self-pay

## 2018-05-08 MED ORDER — NIACIN ER (ANTIHYPERLIPIDEMIC) 500 MG PO TBCR: 1000.0000 mg | EXTENDED_RELEASE_TABLET | Freq: Every day | ORAL | 1 refills | Status: DC

## 2018-05-08 MED ORDER — FUROSEMIDE 40 MG PO TABS: 40.0000 mg | ORAL_TABLET | Freq: Two times a day (BID) | ORAL | 1 refills | Status: DC

## 2018-05-09 NOTE — Telephone Encounter (Signed)
Patient refills faxed to [facility].

## 2018-05-22 ENCOUNTER — Other Ambulatory Visit: Payer: Self-pay | Admitting: Nurse Practitioner

## 2018-05-23 ENCOUNTER — Other Ambulatory Visit: Payer: Self-pay

## 2018-06-18 ENCOUNTER — Encounter: Payer: Self-pay | Admitting: Nurse Practitioner

## 2018-06-18 ENCOUNTER — Other Ambulatory Visit: Payer: Self-pay

## 2018-06-18 ENCOUNTER — Ambulatory Visit (INDEPENDENT_AMBULATORY_CARE_PROVIDER_SITE_OTHER): Payer: Medicare Other | Admitting: Nurse Practitioner

## 2018-06-18 VITALS — BP 158/70 | HR 86 | Temp 98.0°F | Ht 73.0 in | Wt 300.0 lb

## 2018-06-18 DIAGNOSIS — Z79899 Other long term (current) drug therapy: Secondary | ICD-10-CM

## 2018-06-18 DIAGNOSIS — Z89511 Acquired absence of right leg below knee: Secondary | ICD-10-CM

## 2018-06-18 DIAGNOSIS — Z794 Long term (current) use of insulin: Secondary | ICD-10-CM

## 2018-06-18 DIAGNOSIS — E782 Mixed hyperlipidemia: Secondary | ICD-10-CM

## 2018-06-18 DIAGNOSIS — R7989 Other specified abnormal findings of blood chemistry: Secondary | ICD-10-CM | POA: Diagnosis not present

## 2018-06-18 DIAGNOSIS — I739 Peripheral vascular disease, unspecified: Secondary | ICD-10-CM

## 2018-06-18 DIAGNOSIS — E1159 Type 2 diabetes mellitus with other circulatory complications: Secondary | ICD-10-CM | POA: Diagnosis not present

## 2018-06-18 DIAGNOSIS — Z7982 Long term (current) use of aspirin: Secondary | ICD-10-CM | POA: Diagnosis not present

## 2018-06-18 DIAGNOSIS — I10 Essential (primary) hypertension: Secondary | ICD-10-CM

## 2018-06-18 DIAGNOSIS — G629 Polyneuropathy, unspecified: Secondary | ICD-10-CM

## 2018-06-18 LAB — COMPREHENSIVE METABOLIC PANEL
ALT: 23 IU/L (ref 0–44)
AST: 26 IU/L (ref 0–40)
Albumin/Globulin Ratio: 1.4 (ref 1.2–2.2)
Albumin: 4.1 g/dL (ref 3.5–4.7)
Alkaline Phosphatase: 72 IU/L (ref 39–117)
BUN/Creatinine Ratio: 29 — ABNORMAL HIGH (ref 10–24)
BUN: 61 mg/dL — ABNORMAL HIGH (ref 8–27)
Bilirubin Total: 0.5 mg/dL (ref 0.0–1.2)
CO2: 22 mmol/L (ref 20–29)
Calcium: 10.2 mg/dL (ref 8.6–10.2)
Chloride: 105 mmol/L (ref 96–106)
Creatinine, Ser: 2.1 mg/dL — ABNORMAL HIGH (ref 0.76–1.27)
GFR calc Af Amer: 33 mL/min/{1.73_m2} — ABNORMAL LOW (ref >59)
GFR calc non Af Amer: 29 mL/min/{1.73_m2} — ABNORMAL LOW (ref >59)
Globulin, Total: 3 g/dL (ref 1.5–4.5)
Glucose: 93 mg/dL (ref 65–99)
Potassium: 4.5 mmol/L (ref 3.5–5.2)
Sodium: 139 mmol/L (ref 134–144)
Total Protein: 7.1 g/dL (ref 6.0–8.5)

## 2018-06-18 LAB — LIPID PANEL
Chol/HDL Ratio: 2.2 ratio (ref 0.0–5.0)
Cholesterol, Total: 91 mg/dL — ABNORMAL LOW (ref 100–199)
HDL: 42 mg/dL (ref >39)
LDL Calculated: 38 mg/dL (ref 0–99)
Triglycerides: 53 mg/dL (ref 0–149)
VLDL Cholesterol Cal: 11 mg/dL (ref 5–40)

## 2018-06-18 LAB — HEMOGLOBIN A1C
Est. average glucose Bld gHb Est-mCnc: 117 mg/dL
Hgb A1c MFr Bld: 5.7 % — ABNORMAL HIGH (ref 4.8–5.6)

## 2018-06-18 MED ORDER — GABAPENTIN 100 MG PO CAPS: 100.0000 mg | ORAL_CAPSULE | Freq: Three times a day (TID) | ORAL | 2 refills | Status: DC

## 2018-06-18 MED ORDER — PANTOPRAZOLE SODIUM 40 MG PO TBEC: 40.0000 mg | DELAYED_RELEASE_TABLET | Freq: Every day | ORAL | 1 refills | Status: DC

## 2018-06-18 MED ORDER — LOSARTAN POTASSIUM 100 MG PO TABS: 100.0000 mg | ORAL_TABLET | Freq: Every day | ORAL | 1 refills | Status: DC

## 2018-06-18 MED ORDER — FREESTYLE LIBRE READER DEVI: 1.0000 | Freq: Three times a day (TID) | 0 refills | Status: DC

## 2018-06-18 MED ORDER — ALLOPURINOL 100 MG PO TABS: 100.0000 mg | ORAL_TABLET | Freq: Every day | ORAL | 1 refills | Status: DC

## 2018-06-18 MED ORDER — FREESTYLE LIBRE 14 DAY SENSOR MISC: 1.0000 | 6 refills | Status: DC

## 2018-06-18 MED ORDER — ROSUVASTATIN CALCIUM 10 MG PO TABS: 10.0000 mg | ORAL_TABLET | Freq: Every day | ORAL | 1 refills | Status: DC

## 2018-06-18 NOTE — Progress Notes (Addendum)
Subjective:     Patient ID: Micheal Walls , male    DOB: December 07, 1935 , 84 y.o.   MRN: [id number]   Chief Complaint  Patient presents with  . Diabetes    HPI  Diabetes  He presents for his follow-up diabetic visit. He has type 2 diabetes mellitus. His disease course has been stable. Pertinent negatives for hypoglycemia include no confusion, dizziness, headaches or nervousness/anxiousness. Pertinent negatives for diabetes include no blurred vision, no polydipsia, no polyphagia and no polyuria. Symptoms are stable. There are no known risk factors for coronary artery disease. Current diabetic treatment includes oral agent (dual therapy). He is compliant with treatment all of the time. His weight is stable. He has not had a previous visit with a dietitian. His overall blood glucose range is 110-130 mg/dl. An ACE inhibitor/angiotensin II receptor blocker is being taken. He does not see a podiatrist.Eye exam is current.  Hypertension  This is a chronic problem. The current episode started more than 1 year ago. The problem is unchanged. The problem is controlled. Pertinent negatives include no blurred vision or headaches. There are no associated agents to hypertension. There are no known risk factors for coronary artery disease. Past treatments include ACE inhibitors. The current treatment provides no improvement. There are no compliance problems.  There is no history of CAD/MI.     Past Medical History:  Diagnosis Date  . 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 Anemia   . Diastolic heart failure ([place])   . Diverticulosis   . DM (diabetes mellitus) ([place])   . Gout   . Hyperlipemia   . OSA on CPAP   . RBBB   . Systemic hypertension      Family History  Problem Relation Age of Onset  . Diabetes Mother   . Heart attack Father   . Cancer Sister      Current Outpatient Medications:  .  allopurinol (ZYLOPRIM) 100 MG tablet, Take 100 mg by mouth daily., Disp: , Rfl:  .  antiseptic oral  rinse (BIOTENE) LIQD, 15 mLs by Mouth Rinse route as needed for dry mouth., Disp: , Rfl:  .  aspirin 81 MG tablet, Take 162 mg by mouth daily., Disp: , Rfl:  .  Castellani Paint Modified 1.5 % LIQD, Use a cotton tip applicator. Paint between toes 2, 3, 4, 5 both feet once daily, Disp: 1 Bottle, Rfl: 1 .  colchicine 0.6 MG tablet, Take 0.6 mg by mouth as needed (gout flare up). , Disp: , Rfl:  .  diltiazem (TIAZAC) 300 MG 24 hr capsule, Take 300 mg by mouth daily., Disp: , Rfl:  .  ferrous sulfate 325 (65 FE) MG tablet, Take 325 mg by mouth daily with breakfast., Disp: , Rfl:  .  fish oil-omega-3 fatty acids 1000 MG capsule, Take 2 g by mouth daily., Disp: , Rfl:  .  furosemide (LASIX) 40 MG tablet, Take 1 tablet by mouth twice a day, Disp: 180 tablet, Rfl: 0 .  hydrocortisone (ANUSOL-HC) 25 MG suppository, Place 25 mg rectally 2 (two) times daily as needed for hemorrhoids or anal itching., Disp: , Rfl:  .  Insulin Aspart (NOVOLOG [place]), Inject 42-48 Units into the skin 2 (two) times daily. 48 in the am and 42 at night., Disp: , Rfl:  .  Insulin Detemir (LEVEMIR FLEXTOUCH) 100 UNIT/ML Pen, Inject 20 Units into the skin at bedtime., Disp: 30 mL, Rfl: 1 .  ketotifen (ALAWAY) 0.025 % ophthalmic solution,  Place 1 drop into both eyes 2 (two) times daily., Disp: , Rfl:  .  Lancet Devices (MICROLET NEXT LANCING DEVICE) MISC, by Does not apply route. Check blood sugar 3 times per day, Disp: , Rfl:  .  losartan (COZAAR) 100 MG tablet, Take 1 tablet by mouth every day, Disp: 90 tablet, Rfl: 0 .  Multiple Vitamin (MULTIVITAMIN) tablet, Take 1 tablet by mouth daily., Disp: , Rfl:  .  niacin (NIASPAN) 500 MG CR tablet, Take 2 tablets (1,000 mg total) by mouth at bedtime., Disp: 180 tablet, Rfl: 1 .  NOVOLOG FLEXPEN 100 UNIT/ML FlexPen, Inject subcutaneously 42 units every morning and 42 units every evening (max dose), Disp: 5 pen, Rfl: 1 .  pantoprazole (PROTONIX) 40 MG tablet, Take 1 tablet (40 mg total) by mouth  daily., Disp: 90 tablet, Rfl: 1 .  Polyvinyl Alcohol-Povidone (REFRESH OP), Apply 1 drop to eye 2 (two) times daily., Disp: , Rfl:  .  pramoxine-hydrocortisone (PRAMOSONE) cream, Apply topically 3 (three) times daily. Apply topically to hemorrhoids four times daily as needed after bowel movements, for external use only, Disp: , Rfl:  .  PRESCRIPTION MEDICATION, Place 1 drop into both eyes 2 (two) times daily. For glucoma, Disp: , Rfl:  .  rosuvastatin (CRESTOR) 10 MG tablet, Take 1 tablet (10 mg total) by mouth daily., Disp: 90 tablet, Rfl: 1 .  Semaglutide,0.25 or 0.5MG /DOS, (OZEMPIC, 0.25 OR 0.5 MG/DOSE,) 2 MG/1.5ML SOPN, Inject into the skin. Inject 0.5mg  by subcutaneous route every week on the same day of each week in the abdomen,thighs or upper arm sites, Disp: , Rfl:  .  tamsulosin (FLOMAX) 0.4 MG CAPS capsule, Take 1 capsule by mouth daily., Disp: , Rfl:    No Known Allergies   Review of Systems  Constitutional: Negative.   Eyes: Negative for blurred vision.  Respiratory: Negative.   Cardiovascular: Negative.   Gastrointestinal: Negative.   Endocrine: Negative for polydipsia, polyphagia and polyuria.  Skin: Negative.   Neurological: Negative for dizziness, syncope, light-headedness and headaches.  Psychiatric/Behavioral: Negative for confusion. The patient is not nervous/anxious.      Today's Vitals   06/18/18 1007  BP: (!) 158/70  Pulse: 86  Temp: 98 F (36.7 C)  TempSrc: Oral  SpO2: 96%  Weight: 300 lb (136.1 kg)  Height: 6\' 1"  (1.854 m)  PainSc: 0-No pain   Body mass index is 39.58 kg/m.   Objective:  Physical Exam Vitals signs reviewed.  Constitutional:      Appearance: Normal appearance.  Cardiovascular:     Rate and Rhythm: Normal rate and regular rhythm.     Pulses: Normal pulses.     Heart sounds: No murmur.  Pulmonary:     Effort: Pulmonary effort is normal.     Breath sounds: Normal breath sounds.  Musculoskeletal:     Left ankle: He exhibits  swelling and abnormal pulse (PAD). No tenderness.     Comments: Right BKA  Skin:    General: Skin is warm and dry.     Capillary Refill: Capillary refill takes less than 2 seconds.  Neurological:     General: No focal deficit present.     Mental Status: He is alert.  Psychiatric:        Mood and Affect: Mood normal.         Assessment And Plan:     1. PAD (peripheral artery disease) (HCC)  History of PAD taking ASA daily - gabapentin (NEURONTIN) 100 MG capsule; Take 1 capsule (  100 mg total) by mouth 3 (three) times daily.  Dispense: 90 capsule; Refill: 2  2. Neuropathy  Will treat with gabapentin as needed  Decreased sensation to left foot - gabapentin (NEURONTIN) 100 MG capsule; Take 1 capsule (100 mg total) by mouth 3 (three) times daily.  Dispense: 90 capsule; Refill: 2  3. Type 2 diabetes mellitus with other circulatory complication, with long-term current use of insulin (HCC)  Chronic, better controlled  I would like to have him to decrease his Novolog since he is stabilizing, I will set him up with the Diabetic Educator Colvin Caroli  The goal is to get him transitioned to [place] and decrease the Novolog.   Continue with current medications  Encouraged to limit intake of sugary foods and drinks  Encouraged to increase physical activity to 150 minutes per week - Comprehensive metabolic panel - Hemoglobin A1c - Diabetes foot exam - Continuous Blood Gluc Receiver (FREESTYLE LIBRE READER) DEVI; 1 each by Does not apply route 4 (four) times daily -  before meals and at bedtime.  Dispense: 1 Device; Refill: 0 - Continuous Blood Gluc Sensor (FREESTYLE LIBRE 14 DAY SENSOR) MISC; 1 each by Does not apply route every 14 (fourteen) days.  Dispense: 2 each; Refill: 6  4. Mixed hyperlipidemia  Chronic, controlled  Continue with current medications - Lipid panel  5. Elevated TSH  Elevated at last visit will recheck today  No previous history of elevated TSH - TSH  6.  Other long term (current) drug therapy  - TSH  7. Essential hypertension . B/P is fairly controlled, slightly elevated today . CMP ordered to check renal function.  . The importance of regular exercise and dietary modification was stressed to the patient.  . Stressed importance of losing ten percent of her body weight to help with B/P control.  . The weight loss would help with decreasing cardiac and cancer risk as well.      [name], [name]

## 2018-06-18 NOTE — Patient Instructions (Addendum)
Diabetic Neuropathy Diabetic neuropathy refers to nerve damage that is caused by diabetes (diabetes mellitus). Over time, people with diabetes can develop nerve damage throughout the body. There are several types of diabetic neuropathy:  Peripheral neuropathy. This is the most common type of diabetic neuropathy. It causes damage to nerves that carry signals between the spinal cord and other parts of the body (peripheral nerves). This usually affects nerves in the feet and legs first, and may eventually affect the hands and arms. The damage affects the ability to sense touch or temperature.  Autonomic neuropathy. This type causes damage to nerves that control involuntary functions (autonomic nerves). These nerves carry signals that control: ? Heartbeat. ? Body temperature. ? Blood pressure. ? Urination. ? Digestion. ? Sweating. ? Sexual function. ? Response to changing blood sugar (glucose) levels.  Focal neuropathy. This type of nerve damage affects one area of the body, such as an arm, a leg, or the face. The injury may involve one nerve or a small group of nerves. Focal neuropathy can be painful and unpredictable, and occurs most often in older adults with diabetes. This often develops suddenly, but usually improves over time and does not cause long-term problems.  Proximal neuropathy. This type of nerve damage affects the nerves of the thighs, hips, buttocks, or legs. It causes severe pain, weakness, and muscle death (atrophy), usually in the thigh muscles. It is more common among older men and people who have type 2 diabetes. The length of recovery time may vary. What are the causes? Peripheral, autonomic, and focal neuropathies are caused by diabetes that is not well controlled with treatment. The cause of proximal neuropathy is not known, but it may be caused by inflammation related to uncontrolled blood glucose levels. What are the signs or symptoms? Peripheral neuropathy Peripheral  neuropathy develops slowly over time. When the nerves of the feet and legs no longer work, you may experience:  Burning, stabbing, or aching pain in the legs or feet.  Pain or cramping in the legs or feet.  Loss of feeling (numbness) and inability to feel pressure or pain in the feet. This can lead to: ? Thick calluses or sores on areas of constant pressure. ? Ulcers. ? Reduced ability to feel temperature changes.  Foot deformities.  Muscle weakness.  Loss of balance or coordination. Autonomic neuropathy The symptoms of autonomic neuropathy vary depending on which nerves are affected. Symptoms may include:  Problems with digestion, such as: ? Nausea or vomiting. ? Poor appetite. ? Bloating. ? Diarrhea or constipation. ? Trouble swallowing. ? Losing weight without trying to.  Problems with the heart, blood and lungs, such as: ? Dizziness, especially when standing up. ? Fainting. ? Shortness of breath. ? Irregular heartbeat.  Bladder problems, such as: ? Trouble starting or stopping urination. ? Leaking urine. ? Trouble emptying the bladder. ? Urinary tract infections (UTIs).  Problems with other body functions, such as: ? Sweat. You may sweat too much or too little. ? Temperature. You might get hot easily. Or, you might feel cold more than usual. ? Sexual function. Men may not be able to get or maintain an erection. Women may have vaginal dryness and difficulty with arousal. Focal neuropathy Symptoms affect only one area of the body. Common symptoms include:  Numbness.  Tingling.  Burning pain.  Prickling feeling.  Very sensitive skin.  Weakness.  Inability to move (paralysis).  Muscle twitching.  Muscles getting smaller (wasting).  Poor coordination.  Double or blurred vision.  Proximal neuropathy  Sudden, severe pain in the hip, thigh, or buttocks. Pain may spread from the back into the legs (sciatica).  Pain and numbness in the arms and  legs.  Tingling.  Loss of bladder control or bowel control.  Weakness and wasting of thigh muscles.  Difficulty getting up from a seated position.  Abdominal swelling.  Unexplained weight loss. How is this diagnosed? Diagnosis usually involves reviewing your medical history and any symptoms you have. Diagnosis varies depending on the type of neuropathy your health care provider suspects. Peripheral neuropathy Your health care provider will check areas that are affected by your nervous system (neurologic exam), such as your reflexes, how you move, and what you can feel. You may have other tests, such as:  Blood tests.  Removal and examination of fluid that surrounds the spinal cord (lumbar puncture).  CT scan.  MRI.  A test to check the nerves that control muscles (electromyogram, EMG).  Tests of how quickly messages pass through your nerves (nerve conduction velocity tests).  Removal of a small piece of nerve to be examined under a microscope (biopsy). Autonomic neuropathy You may have tests, such as:  Tests to measure your blood pressure and heart rate. This may include monitoring you while you are safely secured to an exam table that moves you from a lying position to an upright position (table tilt test).  Breathing tests to check your lungs.  Tests to check how food moves through the digestive system (gastric emptying tests).  Blood, sweat, or urine tests.  Ultrasound of your bladder.  Spinal fluid tests. Focal neuropathy This condition may be diagnosed with:  A neurologic exam.  CT scan.  MRI.  EMG.  Nerve conduction velocity tests. Proximal neuropathy There is no test to diagnose this type of neuropathy. You may have tests to rule out other possible causes of this type of neuropathy. Tests may include:  X-rays of your spine and lumbar region.  Lumbar puncture.  MRI. How is this treated? The goal of treatment is to keep nerve damage from getting  worse. The most important part of treatment is keeping your blood glucose level and your A1C level within your target range by following your diabetes management plan. Over time, maintaining lower blood glucose levels helps lessen symptoms. In some cases, you may need prescription pain medicine. Follow these instructions at home:  Lifestyle   Do not use any products that contain nicotine or tobacco, such as cigarettes and e-cigarettes. If you need help quitting, ask your health care provider.  Be physically active every day. Include strength training and balance exercises.  Follow a healthy meal plan.  Work with your health care provider to manage your blood pressure. General instructions  Follow your diabetes management plan as directed. ? Check your blood glucose levels as directed by your health care provider. ? Keep your blood glucose in your target range as directed by your health care provider. ? Have your A1C level checked at least two times a year, or as often as told by your health care provider.  Take over the counter and prescription medicines only as told by your health care provider. This includes insulin and diabetes medicine.  Do not drive or use heavy machinery while taking prescription pain medicines.  Check your skin and feet every day for cuts, bruises, redness, blisters, or sores.  Keep all follow up visits as told by your health care provider. This is important. Contact a health care provider if:  You have burning, stabbing, or aching pain in your legs or feet.  You are unable to feel pressure or pain in your feet.  You develop problems with digestion, such as: ? Nausea. ? Vomiting. ? Bloating. ? Constipation. ? Diarrhea. ? Abdominal pain.  You have difficulty with urination, such as inability: ? To control when you urinate (incontinence). ? To completely empty the bladder (retention).  You have palpitations.  You feel dizzy, weak, or faint when you  stand up. Get help right away if:  You cannot urinate.  You have sudden weakness or loss of coordination.  You have trouble speaking.  You have pain or pressure in your chest.  You have an irregular heart beat.  You have sudden inability to move a part of your body. Summary  Diabetic neuropathy refers to nerve damage that is caused by diabetes. It can affect nerves throughout the entire body, causing numbness and pain in the arms, legs, digestive tract, heart, and other body systems.  Keep your blood glucose level and your blood pressure in your target range, as directed by your health care provider. This can help prevent neuropathy from getting worse.  Check your skin and feet every day for cuts, bruises, redness, blisters, or sores.  Do not use any products that contain nicotine or tobacco, such as cigarettes and e-cigarettes. If you need help quitting, ask your health care provider. This information is not intended to replace advice given to you by your health care provider. Make sure you discuss any questions you have with your health care provider. Document Released: 08/08/2001 Document Revised: 07/12/2017 Document Reviewed: 07/04/2016 Elsevier Interactive Patient Education  2019 [place].  Diabetes Basics  Diabetes (diabetes mellitus) is a long-term (chronic) disease. It occurs when the body does not properly use sugar (glucose) that is released from food after you eat. Diabetes may be caused by one or both of these problems:  Your pancreas does not make enough of a hormone called insulin.  Your body does not react in a normal way to insulin that it makes. Insulin lets sugars (glucose) go into cells in your body. This gives you energy. If you have diabetes, sugars cannot get into cells. This causes high blood sugar (hyperglycemia). Follow these instructions at home: How is diabetes treated? You may need to take insulin or other diabetes medicines daily to keep your  blood sugar in balance. Take your diabetes medicines every day as told by your doctor. List your diabetes medicines here: Diabetes medicines  Name of medicine: ______________________________ ? Amount (dose): _______________ Time (a.m./p.m.): _______________ Notes: ___________________________________  Name of medicine: ______________________________ ? Amount (dose): _______________ Time (a.m./p.m.): _______________ Notes: ___________________________________  Name of medicine: ______________________________ ? Amount (dose): _______________ Time (a.m./p.m.): _______________ Notes: ___________________________________ If you use insulin, you will learn how to give yourself insulin by injection. You may need to adjust the amount based on the food that you eat. List the types of insulin you use here: Insulin  Insulin type: ______________________________ ? Amount (dose): _______________ Time (a.m./p.m.): _______________ Notes: ___________________________________  Insulin type: ______________________________ ? Amount (dose): _______________ Time (a.m./p.m.): _______________ Notes: ___________________________________  Insulin type: ______________________________ ? Amount (dose): _______________ Time (a.m./p.m.): _______________ Notes: ___________________________________  Insulin type: ______________________________ ? Amount (dose): _______________ Time (a.m./p.m.): _______________ Notes: ___________________________________  Insulin type: ______________________________ ? Amount (dose): _______________ Time (a.m./p.m.): _______________ Notes: ___________________________________ How do I manage my blood sugar?  Check your blood sugar levels using a blood glucose monitor as directed by your doctor. Your doctor  will set treatment goals for you. Generally, you should have these blood sugar levels:  Before meals (preprandial): 80-130 mg/dL (4.3-0.4 mmol/L).  After meals (postprandial): below 180  mg/dL (10 mmol/L).  A1c level: less than 7%. Write down the times that you will check your blood sugar levels: Blood sugar checks  Time: _______________ Notes: ___________________________________  Time: _______________ Notes: ___________________________________  Time: _______________ Notes: ___________________________________  Time: _______________ Notes: ___________________________________  Time: _______________ Notes: ___________________________________  Time: _______________ Notes: ___________________________________  What do I need to know about low blood sugar? Low blood sugar is called hypoglycemia. This is when blood sugar is at or below 70 mg/dL (3.9 mmol/L). Symptoms may include:  Feeling: ? Hungry. ? Worried or nervous (anxious). ? Sweaty and clammy. ? Confused. ? Dizzy. ? Sleepy. ? Sick to your stomach (nauseous).  Having: ? A fast heartbeat. ? A headache. ? A change in your vision. ? Tingling or no feeling (numbness) around the mouth, lips, or tongue. ? Jerky movements that you cannot control (seizure).  Having trouble with: ? Moving (coordination). ? Sleeping. ? Passing out (fainting). ? Getting upset easily (irritability). Treating low blood sugar To treat low blood sugar, eat or drink something sugary right away. If you can think clearly and swallow safely, follow the 15:15 rule:  Take 15 grams of a fast-acting carb (carbohydrate). Talk with your doctor about how much you should take.  Some fast-acting carbs are: ? Sugar tablets (glucose pills). Take 3-4 glucose pills. ? 6-8 pieces of hard candy. ? 4-6 oz (120-150 mL) of fruit juice. ? 4-6 oz (120-150 mL) of regular (not diet) soda. ? 1 Tbsp (15 mL) honey or sugar.  Check your blood sugar 15 minutes after you take the carb.  If your blood sugar is still at or below 70 mg/dL (3.9 mmol/L), take 15 grams of a carb again.  If your blood sugar does not go above 70 mg/dL (3.9 mmol/L) after 3 tries,  get help right away.  After your blood sugar goes back to normal, eat a meal or a snack within 1 hour. Treating very low blood sugar If your blood sugar is at or below 54 mg/dL (3 mmol/L), you have very low blood sugar (severe hypoglycemia). This is an emergency. Do not wait to see if the symptoms will go away. Get medical help right away. Call your local emergency services (911 in the [place] Do not drive yourself to the hospital. Questions to ask your health care provider  Do I need to meet with a diabetes educator?  What equipment will I need to care for myself at home?  What diabetes medicines do I need? When should I take them?  How often do I need to check my blood sugar?  What number can I call if I have questions?  When is my next doctor's visit?  Where can I find a support group for people with diabetes? Where to find more information  American Diabetes Association: www.diabetes.org  American Association of Diabetes Educators: www.diabeteseducator.org/patient-resources Contact a doctor if:  Your blood sugar is at or above 240 mg/dL (10.2 mmol/L) for 2 days in a row.  You have been sick or have had a fever for 2 days or more, and you are not getting better.  You have any of these problems for more than 6 hours: ? You cannot eat or drink. ? You feel sick to your stomach (nauseous). ? You throw up (vomit). ? You have watery poop (diarrhea). Get help  right away if:  Your blood sugar is lower than 54 mg/dL (3 mmol/L).  You get confused.  You have trouble: ? Thinking clearly. ? Breathing. Summary  Diabetes (diabetes mellitus) is a long-term (chronic) disease. It occurs when the body does not properly use sugar (glucose) that is released from food after digestion.  Take insulin and diabetes medicines as told.  Check your blood sugar every day, as often as told.  Keep all follow-up visits as told by your doctor. This is important. This information is not intended  to replace advice given to you by your health care provider. Make sure you discuss any questions you have with your health care provider. Document Released: 09/01/2017 Document Revised: 11/20/2017 Document Reviewed: 09/01/2017 Elsevier Interactive Patient Education  2019 [employer].

## 2018-06-20 ENCOUNTER — Other Ambulatory Visit: Payer: Self-pay

## 2018-06-20 DIAGNOSIS — E1159 Type 2 diabetes mellitus with other circulatory complications: Secondary | ICD-10-CM

## 2018-06-20 DIAGNOSIS — Z794 Long term (current) use of insulin: Secondary | ICD-10-CM

## 2018-06-20 MED ORDER — FREESTYLE LIBRE 14 DAY SENSOR MISC: 1.0000 | 3 refills | Status: DC

## 2018-06-27 LAB — TSH: TSH: 4.94 u[IU]/mL — ABNORMAL HIGH (ref 0.450–4.500)

## 2018-07-03 DIAGNOSIS — E113493 Type 2 diabetes mellitus with severe nonproliferative diabetic retinopathy without macular edema, bilateral: Secondary | ICD-10-CM | POA: Diagnosis not present

## 2018-07-06 ENCOUNTER — Encounter: Payer: Self-pay | Admitting: Nurse Practitioner

## 2018-07-06 ENCOUNTER — Ambulatory Visit (INDEPENDENT_AMBULATORY_CARE_PROVIDER_SITE_OTHER): Payer: Medicare Other | Admitting: Nurse Practitioner

## 2018-07-06 ENCOUNTER — Other Ambulatory Visit: Payer: Self-pay

## 2018-07-06 VITALS — BP 146/82 | HR 92 | Temp 98.0°F | Resp 18 | Ht 73.0 in | Wt 300.0 lb

## 2018-07-06 DIAGNOSIS — E1169 Type 2 diabetes mellitus with other specified complication: Secondary | ICD-10-CM

## 2018-07-06 DIAGNOSIS — E669 Obesity, unspecified: Secondary | ICD-10-CM

## 2018-07-06 DIAGNOSIS — I739 Peripheral vascular disease, unspecified: Secondary | ICD-10-CM | POA: Diagnosis not present

## 2018-07-06 DIAGNOSIS — Z87891 Personal history of nicotine dependence: Secondary | ICD-10-CM

## 2018-07-06 DIAGNOSIS — Z89519 Acquired absence of unspecified leg below knee: Secondary | ICD-10-CM

## 2018-07-06 DIAGNOSIS — Z89511 Acquired absence of right leg below knee: Secondary | ICD-10-CM

## 2018-07-06 DIAGNOSIS — Z7982 Long term (current) use of aspirin: Secondary | ICD-10-CM | POA: Diagnosis not present

## 2018-07-06 NOTE — Progress Notes (Addendum)
Subjective:     Patient ID: Micheal Walls , male    DOB: 12-15-1935 , 83 y.o.   MRN: [id number]   Chief Complaint  Patient presents with  . Peripheral Artery Disease    3 week follow-up     HPI  Here for evaluation of his prosthesis, has been going to the [facility].  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  Previous history of smoking for 40 years, quit smoking in 1997.  Also has a history of diabetes which is controlled at this time. Having the prosthesis allows him to move, shower, get in and out of car.  Ambulation, some meal prep and basic house care needs.  No recent falls.    He lives with 2 grandsons.  Denies pain to his leg.      Past Medical History:  Diagnosis Date  . Amputated below knee ([place])    right  . Anemia   . Diastolic heart failure ([place])   . Diverticulosis   . DM (diabetes mellitus) ([place])   . Gout   . Hyperlipemia   . OSA on CPAP   . RBBB   . Systemic hypertension      Family History  Problem Relation Age of Onset  . Diabetes Mother   . Heart attack Father   . Cancer Sister      Current Outpatient Medications:  .  acetaminophen-codeine (TYLENOL #3) 300-30 MG tablet, TAKE 1 TABLET BY MOUTH EVERY 6 HOURS AS NEEDED FOR DISCOMFORT, Disp: , Rfl:  .  allopurinol (ZYLOPRIM) 100 MG tablet, Take 1 tablet (100 mg total) by mouth daily., Disp: 90 tablet, Rfl: 1 .  antiseptic oral rinse (BIOTENE) LIQD, 15 mLs by Mouth Rinse route as needed for dry mouth., Disp: , Rfl:  .  aspirin 81 MG tablet, Take 162 mg by mouth daily., Disp: , Rfl:  .  Castellani Paint Modified 1.5 % LIQD, Use a cotton tip applicator. Paint between toes 2, 3, 4, 5 both feet once daily, Disp: 1 Bottle, Rfl: 1 .  colchicine 0.6 MG tablet, Take 0.6 mg by mouth as needed (gout flare up). , Disp: , Rfl:  .  Continuous Blood Gluc Receiver (FREESTYLE LIBRE READER) DEVI, 1 each by Does not apply route 4 (four) times daily  -  before meals and at bedtime., Disp: 1 Device, Rfl: 0 .  Continuous Blood Gluc Sensor (FREESTYLE LIBRE 14 DAY SENSOR) MISC, 1 each by Does not apply route every 14 (fourteen) days., Disp: 6 each, Rfl: 3 .  diltiazem (TIAZAC) 300 MG 24 hr capsule, Take 300 mg by mouth daily., Disp: , Rfl:  .  dorzolamide (TRUSOPT) 2 % ophthalmic solution, Place 1 drop into both eyes 2 (two) times daily., Disp: , Rfl:  .  ferrous sulfate 325 (65 FE) MG tablet, Take 325 mg by mouth daily with breakfast., Disp: , Rfl:  .  fish oil-omega-3 fatty acids 1000 MG capsule, Take 2 g by mouth daily., Disp: , Rfl:  .  furosemide (LASIX) 40 MG tablet, Take 1 tablet by mouth twice a day, Disp: 180 tablet, Rfl: 0 .  gabapentin (NEURONTIN) 100 MG capsule, Take 1 capsule (100 mg total) by mouth 3 (three) times daily., Disp: 90 capsule, Rfl: 2 .  hydrocortisone (ANUSOL-HC) 25 MG suppository, Place 25 mg rectally 2 (two) times daily as needed for hemorrhoids or anal itching., Disp: , Rfl:  .  Insulin  Aspart (NOVOLOG [place]), Inject 42-48 Units into the skin 2 (two) times daily. 48 in the am and 42 at night., Disp: , Rfl:  .  Insulin Detemir (LEVEMIR FLEXTOUCH) 100 UNIT/ML Pen, Inject 20 Units into the skin at bedtime., Disp: 30 mL, Rfl: 1 .  ketotifen (ALAWAY) 0.025 % ophthalmic solution, Place 1 drop into both eyes 2 (two) times daily., Disp: , Rfl:  .  Lancet Devices (MICROLET NEXT LANCING DEVICE) MISC, by Does not apply route. Check blood sugar 3 times per day, Disp: , Rfl:  .  losartan (COZAAR) 100 MG tablet, Take 1 tablet (100 mg total) by mouth daily., Disp: 90 tablet, Rfl: 1 .  Multiple Vitamin (MULTIVITAMIN) tablet, Take 1 tablet by mouth daily., Disp: , Rfl:  .  niacin (NIASPAN) 500 MG CR tablet, Take 2 tablets (1,000 mg total) by mouth at bedtime., Disp: 180 tablet, Rfl: 1 .  NOVOLOG FLEXPEN 100 UNIT/ML FlexPen, Inject subcutaneously 42 units every morning and 42 units every evening (max dose), Disp: 5 pen, Rfl: 1 .   pantoprazole (PROTONIX) 40 MG tablet, Take 1 tablet (40 mg total) by mouth daily., Disp: 90 tablet, Rfl: 1 .  Polyvinyl Alcohol-Povidone (REFRESH OP), Apply 1 drop to eye 2 (two) times daily., Disp: , Rfl:  .  pramoxine-hydrocortisone (PRAMOSONE) cream, Apply topically 3 (three) times daily. Apply topically to hemorrhoids four times daily as needed after bowel movements, for external use only, Disp: , Rfl:  .  PRESCRIPTION MEDICATION, Place 1 drop into both eyes 2 (two) times daily. For glucoma, Disp: , Rfl:  .  rosuvastatin (CRESTOR) 10 MG tablet, Take 1 tablet (10 mg total) by mouth daily., Disp: 90 tablet, Rfl: 1 .  Semaglutide,0.25 or 0.5MG /DOS, (OZEMPIC, 0.25 OR 0.5 MG/DOSE,) 2 MG/1.5ML SOPN, Inject into the skin. Inject 0.5mg  by subcutaneous route every week on the same day of each week in the abdomen,thighs or upper arm sites, Disp: , Rfl:  .  tamsulosin (FLOMAX) 0.4 MG CAPS capsule, Take 1 capsule by mouth daily., Disp: , Rfl:    No Known Allergies   Review of Systems  Constitutional: Negative for fatigue.  Respiratory: Negative.   Cardiovascular: Negative.   Musculoskeletal: Negative.        Right prosthesis for right above the knee amputation  Neurological: Negative.      Today's Vitals   07/06/18 1411  BP: (!) 146/82  Pulse: 92  Resp: 18  Temp: 98 F (36.7 C)  TempSrc: Oral  SpO2: 94%  Weight: 300 lb (136.1 kg)  Height: 6\' 1"  (1.854 m)   Body mass index is 39.58 kg/m.   Objective:  Physical Exam Cardiovascular:     Pulses: Normal pulses.     Heart sounds: Normal heart sounds. No murmur.  Pulmonary:     Effort: Pulmonary effort is normal.     Breath sounds: Normal breath sounds.  Musculoskeletal:     Comments: Right below the knee amputation.  Left lower extremity with 1+ -2+ nonpitting edema.  Skin:    General: Skin is warm and dry.  Neurological:     General: No focal deficit present.  Psychiatric:        Mood and Affect: Mood normal.          Assessment And Plan:    1. PAD (peripheral artery disease) ([place])  Has a long history of PAD which resulted in right lower extremity below the knee amputation  2. Diabetes mellitus type 2 in obese ([place])  Chronic,improving.  Continue with current medications  Encouraged to limit intake of sugary foods and drinks  Encouraged to increase physical activity (chair exercises) to 150 minutes per week as tolerated.  Discussed his labs during this office visit  3. S/P unilateral BKA (below knee amputation) ([facility])  Right BKA prosthesis  He uses a prosthesis - he is a limited community ambulation - this patient is limited community ambulator living in a home that has entrance stairs as well as stairs, steps and floor rugs causing uneven surfaces inside the home.     [name], [name]

## 2018-07-06 NOTE — Patient Instructions (Signed)
° ° ° °  If you have lab work done today you will be contacted with your lab results within the next 2 weeks.  If you have not heard from us then please contact us. The fastest way to get your results is to register for My Chart. ° ° °IF you received an x-ray today, you will receive an invoice from [facility]. Please contact [facility] at [phone] with questions or concerns regarding your invoice.  ° °IF you received labwork today, you will receive an invoice from [facility]. Please contact [facility] at [phone] with questions or concerns regarding your invoice.  ° °Our billing staff will not be able to assist you with questions regarding bills from these companies. ° °You will be contacted with the lab results as soon as they are available. The fastest way to get your results is to activate your My Chart account. Instructions are located on the last page of this paperwork. If you have not heard from us regarding the results in 2 weeks, please contact this office. °  ° ° ° °

## 2018-07-09 DIAGNOSIS — H04123 Dry eye syndrome of bilateral lacrimal glands: Secondary | ICD-10-CM | POA: Diagnosis not present

## 2018-07-09 DIAGNOSIS — H01003 Unspecified blepharitis right eye, unspecified eyelid: Secondary | ICD-10-CM | POA: Diagnosis not present

## 2018-07-09 DIAGNOSIS — H04213 Epiphora due to excess lacrimation, bilateral lacrimal glands: Secondary | ICD-10-CM | POA: Diagnosis not present

## 2018-07-09 DIAGNOSIS — H401132 Primary open-angle glaucoma, bilateral, moderate stage: Secondary | ICD-10-CM | POA: Diagnosis not present

## 2018-07-10 ENCOUNTER — Encounter: Payer: Self-pay | Admitting: Podiatry

## 2018-07-10 ENCOUNTER — Ambulatory Visit (INDEPENDENT_AMBULATORY_CARE_PROVIDER_SITE_OTHER): Payer: Medicare Other | Admitting: Podiatry

## 2018-07-10 DIAGNOSIS — E1151 Type 2 diabetes mellitus with diabetic peripheral angiopathy without gangrene: Secondary | ICD-10-CM

## 2018-07-10 DIAGNOSIS — B353 Tinea pedis: Secondary | ICD-10-CM | POA: Diagnosis not present

## 2018-07-10 DIAGNOSIS — B351 Tinea unguium: Secondary | ICD-10-CM

## 2018-07-10 DIAGNOSIS — M79675 Pain in left toe(s): Secondary | ICD-10-CM

## 2018-07-10 DIAGNOSIS — R7989 Other specified abnormal findings of blood chemistry: Secondary | ICD-10-CM | POA: Insufficient documentation

## 2018-07-11 NOTE — Progress Notes (Signed)
Subjective: Micheal Walls presents today for preventative foot care follow-up.  He has history of below-knee amputation of the right lower extremity, diabetic neuropathy, and PAD.    He relates he has picked up his last refill of modified Castellani's paint.   Objective: . 83 year old male in no acute distress.  Alert awake and oriented x3  Vascular Examination: Capillary refill time noted to be delayed to the left foot  Dorsalis pedis and posterior tibial pulses are absent left lower extremity no digital hair x5 digits of the left lower extremity.  Skin temperature gradient warm to cool left lower extremity  Dermatological Examination: Skin of the left lower extremity is noted to be thin and atrophic.  Toenails 1-5 left foot discolored, thick, dystrophic with subungual debris and pain with palpation to nailbeds due to thickness of nails.   Interdigital macerations noted which is resolved webspaces 2, 3 left foot.  He still has some mild maceration noted to the fourth webspace left foot.  From last visit webspaces 2, 3, 4 left foot.  No breaks in skin, no drainage, no blistering, no peeling.  No open wounds or signs of deep space infection noted.  Musculoskeletal: Muscle strength 5/5, muscle groups of left lower extremity  Utilizes motorized chair for mobility.  He is able to transfer on his own.  Neurological: Sensation with 10 gram monofilament is diminished Vibratory sensation is diminished  Assessment: 1. Painful onychomycosis toenails 1 through 5 left foot 2. Interdigital tinea pedis resolved webspaces 2 and 3 left foot 3. Interdigital tinea pedis remains fourth webspace left foot 4. NIDDM with peripheral arterial disease and peripheral neuropathy 5. Status post right below-knee amputation  Plan: 1. Discussed tinea pedis.  He has resolution webspaces 2 or 3 left foot.  He is only to apply the modified Castellani's Paint to the fourth webspace of the left foot once  daily.  Toenails 1-5 left foot were debrided in length and girth without iatrogenic bleeding. 2. Patient to continue soft, supportive shoe gear. 3. Patient to report any pedal injuries to medical professional immediately. 4. Follow up 3 months. Patient/POA to call should there be a concern in the interim.

## 2018-07-12 ENCOUNTER — Ambulatory Visit: Payer: 59 | Admitting: Podiatry

## 2018-07-13 ENCOUNTER — Other Ambulatory Visit: Payer: Self-pay | Admitting: Nurse Practitioner

## 2018-07-17 ENCOUNTER — Encounter: Payer: Self-pay | Admitting: Podiatry

## 2018-07-17 ENCOUNTER — Ambulatory Visit: Payer: Medicare Other | Admitting: Cardiovascular Disease

## 2018-07-17 DIAGNOSIS — E113492 Type 2 diabetes mellitus with severe nonproliferative diabetic retinopathy without macular edema, left eye: Secondary | ICD-10-CM | POA: Diagnosis not present

## 2018-07-17 DIAGNOSIS — E113491 Type 2 diabetes mellitus with severe nonproliferative diabetic retinopathy without macular edema, right eye: Secondary | ICD-10-CM | POA: Diagnosis not present

## 2018-07-18 ENCOUNTER — Ambulatory Visit: Payer: Medicare Other | Admitting: Cardiovascular Disease

## 2018-07-19 ENCOUNTER — Ambulatory Visit (INDEPENDENT_AMBULATORY_CARE_PROVIDER_SITE_OTHER): Payer: Medicare Other | Admitting: Physician Assistant

## 2018-07-19 VITALS — BP 126/60 | HR 88 | Ht 73.0 in | Wt 300.0 lb

## 2018-07-19 DIAGNOSIS — Z9989 Dependence on other enabling machines and devices: Secondary | ICD-10-CM | POA: Diagnosis not present

## 2018-07-19 DIAGNOSIS — R600 Localized edema: Secondary | ICD-10-CM | POA: Diagnosis not present

## 2018-07-19 DIAGNOSIS — N184 Chronic kidney disease, stage 4 (severe): Secondary | ICD-10-CM | POA: Diagnosis not present

## 2018-07-19 DIAGNOSIS — Z794 Long term (current) use of insulin: Secondary | ICD-10-CM | POA: Diagnosis not present

## 2018-07-19 DIAGNOSIS — G4733 Obstructive sleep apnea (adult) (pediatric): Secondary | ICD-10-CM | POA: Diagnosis not present

## 2018-07-19 DIAGNOSIS — Z8673 Personal history of transient ischemic attack (TIA), and cerebral infarction without residual deficits: Secondary | ICD-10-CM

## 2018-07-19 DIAGNOSIS — Z89511 Acquired absence of right leg below knee: Secondary | ICD-10-CM

## 2018-07-19 DIAGNOSIS — E119 Type 2 diabetes mellitus without complications: Secondary | ICD-10-CM

## 2018-07-19 DIAGNOSIS — E785 Hyperlipidemia, unspecified: Secondary | ICD-10-CM

## 2018-07-19 DIAGNOSIS — I10 Essential (primary) hypertension: Secondary | ICD-10-CM

## 2018-07-19 NOTE — Progress Notes (Signed)
Cardiology Office Note    Date:  07/21/2018   ID:  Micheal Walls, DOB 1936-03-27, MRN [id number]  PCP:  Glendale Chard, MD  Cardiologist: Dr. Sallyanne Kuster  Chief Complaint  Patient presents with  . Follow-up    Patient says that his hands hurt and cramp up at times.    History of Present Illness:  Micheal Walls is a 83 y.o. male with past medical history of CVA, right BKA for diabetes related PAD, DM II on insulin, HLD, HTN, OSA on CPAP and CKD followed by Dr. Corliss Parish.  He does not have prior history of CAD.  Myoview performed on 09/18/2009 showed EF 68%, normal perfusion, overall low risk study.  Patient presents today for cardiology office visit.  He is still compliant with his CPAP therapy.  He has been noticing some increased left lower extremity edema for the past few weeks, on physical exam, it is hard to tell how much edema he has given his body size.  His renal function is stable with last creatinine around 2.1.  I am hesitant to aggressively increase his diuretic dosage, therefore I asked him to take 80 mg a.m. and 40 mg p.m. of Lasix for the next 3 days before going back to 40 mg twice daily.  Note, despite the fact he is not on any potassium supplement, his last potassium level seems to be quite normal.  He has upcoming visit with Dr. Corliss Parish who is his nephrologist.   Past Medical History:  Diagnosis Date  . Amputated below knee ([place])    right  . Anemia   . Diastolic heart failure ([place])   . Diverticulosis   . DM (diabetes mellitus) ([place])   . Gout   . Hyperlipemia   . OSA on CPAP   . RBBB   . Systemic hypertension     Past Surgical History:  Procedure Laterality Date  . LEG AMPUTATION BELOW KNEE     right  . NM MYOCAR PERF WALL MOTION  09/18/2009   no ischemia    Current Medications: Outpatient Medications Prior to Visit  Medication Sig Dispense Refill  . allopurinol (ZYLOPRIM) 100 MG tablet Take 1 tablet (100 mg total) by mouth  daily. (Patient taking differently: Take 100 mg by mouth 2 (two) times daily. ) 90 tablet 1  . antiseptic oral rinse (BIOTENE) LIQD 15 mLs by Mouth Rinse route as needed for dry mouth.    Marland Kitchen aspirin 81 MG tablet Take 162 mg by mouth daily.    [name] Use a cotton tip applicator. Paint between toes 2, 3, 4, 5 both feet once daily 1 Bottle 1  . colchicine 0.6 MG tablet Take 0.6 mg by mouth as needed (gout flare up).     . Continuous Blood Gluc Receiver (FREESTYLE LIBRE READER) DEVI 1 each by Does not apply route 4 (four) times daily -  before meals and at bedtime. 1 Device 0  . Continuous Blood Gluc Sensor (FREESTYLE LIBRE 14 DAY SENSOR) MISC 1 each by Does not apply route every 14 (fourteen) days. 6 each 3  . diltiazem (CARDIZEM CD) 300 MG 24 hr capsule TAKE ONE CAPSULE EACH DAY 90 capsule 1  . dorzolamide (TRUSOPT) 2 % ophthalmic solution Place 1 drop into both eyes 2 (two) times daily.    . ferrous sulfate 325 (65 FE) MG tablet Take 325 mg by mouth daily with breakfast.    . fish oil-omega-3 fatty acids  1000 MG capsule Take 2 g by mouth daily.    . furosemide (LASIX) 40 MG tablet Take 1 tablet by mouth twice a day 180 tablet 0  . gabapentin (NEURONTIN) 100 MG capsule Take 1 capsule (100 mg total) by mouth 3 (three) times daily. 90 capsule 2  . hydrocortisone (ANUSOL-HC) 25 MG suppository Place 25 mg rectally 2 (two) times daily as needed for hemorrhoids or anal itching.    . Insulin Aspart (NOVOLOG [place]) Inject 42-48 Units into the skin 2 (two) times daily. 48 in the am and 42 at night.    . Insulin Detemir (LEVEMIR FLEXTOUCH) 100 UNIT/ML Pen Inject 20 Units into the skin at bedtime. 30 mL 1  . ketotifen (ALAWAY) 0.025 % ophthalmic solution Place 1 drop into both eyes 2 (two) times daily.    Elmore Guise Devices ([place] NEXT LANCING DEVICE) MISC by Does not apply route. Check blood sugar 3 times per day    . losartan (COZAAR) 100 MG tablet Take 1 tablet (100 mg total) by  mouth daily. 90 tablet 1  . Multiple Vitamin (MULTIVITAMIN) tablet Take 1 tablet by mouth daily.    Marland Kitchen NOVOLOG FLEXPEN 100 UNIT/ML FlexPen Inject subcutaneously 42 units every morning and 42 units every evening (max dose) 5 pen 1  . pantoprazole (PROTONIX) 40 MG tablet Take 1 tablet (40 mg total) by mouth daily. 90 tablet 1  . Polyvinyl Alcohol-Povidone (REFRESH OP) Apply 1 drop to eye 2 (two) times daily.    . pramoxine-hydrocortisone (PRAMOSONE) cream Apply topically 3 (three) times daily. Apply topically to hemorrhoids four times daily as needed after bowel movements, for external use only    . PRESCRIPTION MEDICATION Place 1 drop into both eyes 2 (two) times daily. For glucoma    . rosuvastatin (CRESTOR) 10 MG tablet Take 1 tablet (10 mg total) by mouth daily. 90 tablet 1  . Semaglutide,0.25 or 0.5MG /DOS, (OZEMPIC, 0.25 OR 0.5 MG/DOSE,) 2 MG/1.5ML SOPN Inject into the skin. Inject 0.5mg  by subcutaneous route every week on the same day of each week in the abdomen,thighs or upper arm sites    . acetaminophen-codeine (TYLENOL #3) 300-30 MG tablet TAKE 1 TABLET BY MOUTH EVERY 6 HOURS AS NEEDED FOR DISCOMFORT    . diltiazem (TIAZAC) 300 MG 24 hr capsule Take 300 mg by mouth daily.    . niacin (NIASPAN) 500 MG CR tablet Take 2 tablets (1,000 mg total) by mouth at bedtime. 180 tablet 1  . tamsulosin (FLOMAX) 0.4 MG CAPS capsule Take 1 capsule by mouth daily.     No facility-administered medications prior to visit.      Allergies:   Patient has no known allergies.   Social History   Socioeconomic History  . Marital status: Married    Spouse name: Not on file  . Number of children: Not on file  . Years of education: Not on file  . Highest education level: Not on file  Occupational History  . Occupation: retired  Scientific laboratory technician  . Financial resource strain: Not hard at all  . Food insecurity:    Worry: Never true    Inability: Never true  . Transportation needs:    Medical: No     Non-medical: No  Tobacco Use  . Smoking status: Former Smoker    Types: Cigars    Last attempt to quit: 06/12/2001    Years since quitting: 17.1  . Smokeless tobacco: Never Used  Substance and Sexual Activity  . Alcohol use: No  Alcohol/week: 0.0 standard drinks  . Drug use: No  . Sexual activity: Yes  Lifestyle  . Physical activity:    Days per week: 0 days    Minutes per session: 0 min  . Stress: Not at all  Relationships  . Social connections:    Talks on phone: Not on file    Gets together: Not on file    Attends religious service: Not on file    Active member of club or organization: Not on file    Attends meetings of clubs or organizations: Not on file    Relationship status: Not on file  Other Topics Concern  . Not on file  Social History Narrative  . Not on file     Family History:  The patient's family history includes Cancer in his sister; Diabetes in his mother; Heart attack in his father.   ROS:   Please see the history of present illness.    ROS All other systems reviewed and are negative.   PHYSICAL EXAM:   VS:  BP 126/60 (BP Location: Left Arm, Patient Position: Sitting, Cuff Size: Large)   Pulse 88   Ht $R'6\' 1"'lp$  (1.854 m)   Wt 300 lb (136.1 kg) Comment: PER PATIENT.  BMI 39.58 kg/m    GEN: Well nourished, well developed, in no acute distress  HEENT: normal  Neck: no JVD, carotid bruits, or masses Cardiac: RRR; no murmurs, rubs, or gallops. 1+ pitting edema  Respiratory:  clear to auscultation bilaterally, normal work of breathing GI: soft, nontender, nondistended, + BS MS: R BKA Skin: warm and dry, no rash Neuro:  Alert and Oriented x 3, Strength and sensation are intact Psych: euthymic mood, full affect  Wt Readings from Last 3 Encounters:  07/19/18 300 lb (136.1 kg)  07/06/18 300 lb (136.1 kg)  06/18/18 300 lb (136.1 kg)      Studies/Labs Reviewed:   EKG:  EKG is ordered today.  The ekg ordered today demonstrates normal sinus rhythm  with right bundle branch block  Recent Labs: 03/15/2018: Hemoglobin 10.6; Platelets 139 06/18/2018: ALT 23; BUN 61; Creatinine, Ser 2.10; Potassium 4.5; Sodium 139; TSH 4.940   Lipid Panel    Component Value Date/Time   CHOL 91 (L) 06/18/2018 1048   TRIG 53 06/18/2018 1048   HDL 42 06/18/2018 1048   CHOLHDL 2.2 06/18/2018 1048   LDLCALC 38 06/18/2018 1048    Additional studies/ records that were reviewed today include:   Myoview 09/18/2009     ASSESSMENT:    1. Leg edema   2. Essential hypertension   3. OSA on CPAP   4. H/O: CVA (cerebrovascular accident)   5. Hx of right BKA ([place])   6. Controlled type 2 diabetes mellitus without complication, with long-term current use of insulin ([place])   7. Hyperlipidemia LDL goal <70   8. Chronic kidney disease (CKD), stage IV (severe) ([place])   9. Morbid obesity ([place])      PLAN:  In order of problems listed above:  1. Bilateral lower extremity edema: He is currently on 40 mg twice daily of Lasix.  Despite the fact that he is not on any potassium supplement, his renal function is quite stable.  I asked him to increase the Lasix to 80 mg a.m. and 40 mg p.m. for the next 3 days before going back to 40 mg twice daily again.  I am hesitant to permanently increase his diuretic given his poor renal function  2. Morbid obesity: Weight  loss is quite important in controlling his swelling, however his functional ability is limited by right BKA.  Functional ability is extremely limited despite leg prosthesis  3. Hypertension: Blood pressure well controlled  4. Obstructive sleep apnea: He has been compliant with CPAP therapy  5. Stage IV CKD: Followed by nephrology service.  6. DM2: Managed by primary care provider  7. History of CVA: No recurrence.  8. Hyperlipidemia: On Crestor 10 mg daily    Medication Adjustments/Labs and Tests Ordered: Current medicines are reviewed at length with the patient today.  Concerns regarding medicines are  outlined above.  Medication changes, Labs and Tests ordered today are listed in the Patient Instructions below. Patient Instructions  Medication Instructions:   INCREASE LASIX 80 MG in the  MORNINGS AND 40 MG in the EVENINGS FOR 3 DAYS-STARTING Friday 07-20-2018 then back to 40 mg 2 times a day  If you need a refill on your cardiac medications before your next appointment, please call your pharmacy.   Lab work:  NONE  If you have labs (blood work) drawn today and your tests are completely normal, you will receive your results only by: Marland Kitchen MyChart Message (if you have MyChart) OR . A paper copy in the mail If you have any lab test that is abnormal or we need to change your treatment, we will call you to review the results.  Testing/Procedures:  NONE  Follow-Up:  Your physician recommends that you follow-up in April 2020 with Dr. Shelva Majestic for CPAP management and to follow-up with Dr. Sallyanne Kuster in 12 months (February 2021) please call our office in December 2020 to get that appointment scheduled.        Hilbert Corrigan, [place]  07/21/2018 11:34 PM    [facility] [address], [place], [place]  [address] Phone: [phone]; Fax: [phone]

## 2018-07-19 NOTE — Patient Instructions (Signed)
Medication Instructions:   INCREASE LASIX 80 MG in the  MORNINGS AND 40 MG in the EVENINGS FOR 3 DAYS-STARTING Friday 07-20-2018 then back to 40 mg 2 times a day  If you need a refill on your cardiac medications before your next appointment, please call your pharmacy.   Lab work:  NONE  If you have labs (blood work) drawn today and your tests are completely normal, you will receive your results only by: Marland Kitchen MyChart Message (if you have MyChart) OR . A paper copy in the mail If you have any lab test that is abnormal or we need to change your treatment, we will call you to review the results.  Testing/Procedures:  NONE  Follow-Up:  Your physician recommends that you follow-up in April 2020 with Dr. Shelva Majestic for CPAP management and to follow-up with Dr. Sallyanne Kuster in 12 months (February 2021) please call our office in December 2020 to get that appointment scheduled.

## 2018-07-21 ENCOUNTER — Encounter: Payer: Self-pay | Admitting: Physician Assistant

## 2018-07-22 ENCOUNTER — Encounter: Payer: Self-pay | Admitting: Nurse Practitioner

## 2018-07-24 DIAGNOSIS — E118 Type 2 diabetes mellitus with unspecified complications: Secondary | ICD-10-CM | POA: Diagnosis not present

## 2018-07-24 DIAGNOSIS — Z6841 Body Mass Index (BMI) 40.0 and over, adult: Secondary | ICD-10-CM | POA: Diagnosis not present

## 2018-07-24 DIAGNOSIS — E785 Hyperlipidemia, unspecified: Secondary | ICD-10-CM | POA: Diagnosis not present

## 2018-07-24 DIAGNOSIS — N183 Chronic kidney disease, stage 3 (moderate): Secondary | ICD-10-CM | POA: Diagnosis not present

## 2018-07-24 DIAGNOSIS — M109 Gout, unspecified: Secondary | ICD-10-CM | POA: Diagnosis not present

## 2018-07-24 DIAGNOSIS — I129 Hypertensive chronic kidney disease with stage 1 through stage 4 chronic kidney disease, or unspecified chronic kidney disease: Secondary | ICD-10-CM | POA: Diagnosis not present

## 2018-07-24 DIAGNOSIS — N3289 Other specified disorders of bladder: Secondary | ICD-10-CM | POA: Diagnosis not present

## 2018-08-07 DIAGNOSIS — E113491 Type 2 diabetes mellitus with severe nonproliferative diabetic retinopathy without macular edema, right eye: Secondary | ICD-10-CM | POA: Diagnosis not present

## 2018-08-13 ENCOUNTER — Telehealth: Payer: Self-pay | Admitting: Nurse Practitioner

## 2018-08-13 NOTE — Telephone Encounter (Signed)
Faxed copy of notes and prescription to [facility], fax number [phone]

## 2018-08-27 DIAGNOSIS — E113492 Type 2 diabetes mellitus with severe nonproliferative diabetic retinopathy without macular edema, left eye: Secondary | ICD-10-CM | POA: Diagnosis not present

## 2018-09-11 ENCOUNTER — Telehealth: Payer: Self-pay | Admitting: Cardiovascular Disease

## 2018-09-12 ENCOUNTER — Emergency Department (HOSPITAL_COMMUNITY)
Admission: EM | Admit: 2018-09-12 | Discharge: 2018-09-12 | Disposition: A | Discharge status: Home / Self Care | Payer: Medicare Other | Attending: Emergency Medicine | Admitting: Emergency Medicine

## 2018-09-12 ENCOUNTER — Encounter (HOSPITAL_COMMUNITY): Payer: Self-pay | Admitting: Emergency Medicine

## 2018-09-12 ENCOUNTER — Other Ambulatory Visit: Payer: Self-pay

## 2018-09-12 DIAGNOSIS — Z7982 Long term (current) use of aspirin: Secondary | ICD-10-CM | POA: Diagnosis not present

## 2018-09-12 DIAGNOSIS — N183 Chronic kidney disease, stage 3 (moderate): Secondary | ICD-10-CM | POA: Diagnosis not present

## 2018-09-12 DIAGNOSIS — Z89511 Acquired absence of right leg below knee: Secondary | ICD-10-CM | POA: Diagnosis not present

## 2018-09-12 DIAGNOSIS — L97909 Non-pressure chronic ulcer of unspecified part of unspecified lower leg with unspecified severity: Secondary | ICD-10-CM | POA: Diagnosis not present

## 2018-09-12 DIAGNOSIS — Z79899 Other long term (current) drug therapy: Secondary | ICD-10-CM | POA: Insufficient documentation

## 2018-09-12 DIAGNOSIS — L03116 Cellulitis of left lower limb: Secondary | ICD-10-CM | POA: Insufficient documentation

## 2018-09-12 DIAGNOSIS — I5030 Unspecified diastolic (congestive) heart failure: Secondary | ICD-10-CM | POA: Insufficient documentation

## 2018-09-12 DIAGNOSIS — E1122 Type 2 diabetes mellitus with diabetic chronic kidney disease: Secondary | ICD-10-CM | POA: Diagnosis not present

## 2018-09-12 DIAGNOSIS — Z87891 Personal history of nicotine dependence: Secondary | ICD-10-CM | POA: Insufficient documentation

## 2018-09-12 DIAGNOSIS — I130 Hypertensive heart and chronic kidney disease with heart failure and stage 1 through stage 4 chronic kidney disease, or unspecified chronic kidney disease: Secondary | ICD-10-CM | POA: Diagnosis not present

## 2018-09-12 DIAGNOSIS — N184 Chronic kidney disease, stage 4 (severe): Secondary | ICD-10-CM | POA: Insufficient documentation

## 2018-09-12 DIAGNOSIS — Z794 Long term (current) use of insulin: Secondary | ICD-10-CM | POA: Insufficient documentation

## 2018-09-12 DIAGNOSIS — R2242 Localized swelling, mass and lump, left lower limb: Secondary | ICD-10-CM | POA: Diagnosis present

## 2018-09-12 DIAGNOSIS — Z8673 Personal history of transient ischemic attack (TIA), and cerebral infarction without residual deficits: Secondary | ICD-10-CM | POA: Insufficient documentation

## 2018-09-12 DIAGNOSIS — E119 Type 2 diabetes mellitus without complications: Secondary | ICD-10-CM | POA: Diagnosis not present

## 2018-09-12 DIAGNOSIS — Z6839 Body mass index (BMI) 39.0-39.9, adult: Secondary | ICD-10-CM | POA: Diagnosis not present

## 2018-09-12 LAB — CBC WITH DIFFERENTIAL/PLATELET
Abs Immature Granulocytes: 0.01 10*3/uL (ref 0.00–0.07)
Basophils Absolute: 0 10*3/uL (ref 0.0–0.1)
Basophils Relative: 0 %
Eosinophils Absolute: 0.2 10*3/uL (ref 0.0–0.5)
Eosinophils Relative: 4 %
HCT: 35.8 % — ABNORMAL LOW (ref 39.0–52.0)
Hemoglobin: 11.4 g/dL — ABNORMAL LOW (ref 13.0–17.0)
Immature Granulocytes: 0 %
Lymphocytes Relative: 34 %
Lymphs Abs: 2 10*3/uL (ref 0.7–4.0)
MCH: 30.6 pg (ref 26.0–34.0)
MCHC: 31.8 g/dL (ref 30.0–36.0)
MCV: 96 fL (ref 80.0–100.0)
Monocytes Absolute: 0.6 10*3/uL (ref 0.1–1.0)
Monocytes Relative: 10 %
Neutro Abs: 3.1 10*3/uL (ref 1.7–7.7)
Neutrophils Relative %: 52 %
Platelets: 150 10*3/uL (ref 150–400)
RBC: 3.73 MIL/uL — ABNORMAL LOW (ref 4.22–5.81)
RDW: 15.6 % — ABNORMAL HIGH (ref 11.5–15.5)
WBC: 6 10*3/uL (ref 4.0–10.5)
nRBC: 0 % (ref 0.0–0.2)

## 2018-09-12 LAB — BASIC METABOLIC PANEL
Anion gap: 11 (ref 5–15)
BUN: 64 mg/dL — ABNORMAL HIGH (ref 8–23)
CO2: 22 mmol/L (ref 22–32)
Calcium: 10.1 mg/dL (ref 8.9–10.3)
Chloride: 106 mmol/L (ref 98–111)
Creatinine, Ser: 2.71 mg/dL — ABNORMAL HIGH (ref 0.61–1.24)
GFR calc Af Amer: 25 mL/min — ABNORMAL LOW (ref >60)
GFR calc non Af Amer: 21 mL/min — ABNORMAL LOW (ref >60)
Glucose, Bld: 126 mg/dL — ABNORMAL HIGH (ref 70–99)
Potassium: 4.3 mmol/L (ref 3.5–5.1)
Sodium: 139 mmol/L (ref 135–145)

## 2018-09-12 MED ORDER — CEPHALEXIN 250 MG PO CAPS
500.0000 mg | ORAL_CAPSULE | Freq: Once | ORAL | Status: AC
  Administered 2018-09-12: 500 mg via ORAL
  Filled 2018-09-12: qty 2

## 2018-09-12 MED ORDER — CEPHALEXIN 500 MG PO CAPS: 500.0000 mg | ORAL_CAPSULE | Freq: Two times a day (BID) | ORAL | 0 refills | Status: DC

## 2018-09-12 MED ORDER — DOXYCYCLINE HYCLATE 100 MG PO TABS
100.0000 mg | ORAL_TABLET | Freq: Once | ORAL | Status: AC
  Administered 2018-09-12: 100 mg via ORAL
  Filled 2018-09-12: qty 1

## 2018-09-12 MED ORDER — DOXYCYCLINE HYCLATE 100 MG PO CAPS: 100.0000 mg | ORAL_CAPSULE | Freq: Two times a day (BID) | ORAL | 0 refills | Status: DC

## 2018-09-12 NOTE — ED Triage Notes (Signed)
Pt reports 2 wounds to L lower anterior leg x 2 weeks. Pt denies fever, drainage from wound, pain. Pt was seen by UC today who told him to come here. Pt is diabetic with prosthetic to R leg.

## 2018-09-12 NOTE — ED Provider Notes (Signed)
[place] EMERGENCY DEPARTMENT Provider Note   CSN: [id number] Arrival date & time: 09/12/18  1715    History   Chief Complaint Chief Complaint  Patient presents with  . Wound Check    HPI Micheal Walls is a 84 y.o. male.     The history is provided by the patient.  Wound Check  This is a new problem. The current episode started more than 1 week ago. The problem occurs daily. The problem has been gradually worsening. Pertinent negatives include no chest pain, no abdominal pain and no shortness of breath. Nothing aggravates the symptoms. Nothing relieves the symptoms. He has tried nothing for the symptoms.  Patient with history of right AKA, diabetes, obesity, hyperlipidemia presents with wounds to his left lower leg.  He reports this started approximately 2 weeks ago.  No known trauma.  Denies fever/vomiting.  He has had some drainage from the wound. Patient reports his blood sugars have been ranging from 100-200. Denies Cough or shortness of breath He uses a wheelchair at baseline Seen in an urgent care, reports he had an x-ray and labs done and sent here for evaluation for possible infection. Does not know the results of that testing.  Patient reports he has had swelling in the leg for over a month, it is improving with recent start of furosemide Past Medical History:  Diagnosis Date  . Amputated below knee ([place])    right  . Anemia   . Diastolic heart failure ([place])   . Diverticulosis   . DM (diabetes mellitus) ([place])   . Gout   . Hyperlipemia   . OSA on CPAP   . RBBB   . Systemic hypertension     Patient Active Problem List   Diagnosis Date Noted  . Elevated TSH 07/10/2018  . Nephropathy 02/28/2018  . Disturbance of skin sensation 02/28/2018  . CKD (chronic kidney disease) stage 4, GFR 15-29 ml/min (HCC) 07/17/2017  . PAD (peripheral artery disease) ([place]) 07/17/2017  . 110.1 10/21/2013  . Epiphora 04/24/2013  . Laxity of eyelid 04/24/2013   . Anemia 04/09/2013  . DM (diabetes mellitus) ([place]) 04/09/2013  . History of peripheral vascular disease 04/09/2013  . History of stroke 04/09/2013  . S/P unilateral BKA (below knee amputation) ([place]) 03/17/2013  . Diabetes mellitus type 2 in obese ([place]) 03/17/2013  . Hyperlipidemia 03/17/2013  . Essential hypertension 03/17/2013  . Gout 03/17/2013  . CKD (chronic kidney disease) stage 3, GFR 30-59 ml/min (HCC) 03/17/2013  . OSA on CPAP 03/17/2013  . Obesity with serious comorbidity 03/17/2013  . Anemia, iron deficiency 03/17/2013  . Diverticulosis of colon 03/17/2013  . RBBB 03/17/2013  . Punctal stenosis, acquired 10/02/2012    Past Surgical History:  Procedure Laterality Date  . LEG AMPUTATION BELOW KNEE     right  . NM MYOCAR PERF WALL MOTION  09/18/2009   no ischemia        Home Medications    Prior to Admission medications   Medication Sig Start Date End Date Taking? Authorizing Provider  allopurinol (ZYLOPRIM) 100 MG tablet Take 1 tablet (100 mg total) by mouth daily. Patient taking differently: Take 100 mg by mouth 2 (two) times daily.  06/18/18   [name], [name]  antiseptic oral rinse (BIOTENE) LIQD 15 mLs by Mouth Rinse route as needed for dry mouth.    [provider]  aspirin 81 MG tablet Take 162 mg by mouth daily.    [provider]  Castellani Paint Modified 1.5 % LIQD Use a cotton tip applicator. Paint between toes 2, 3, 4, 5 both feet once daily 04/12/18   [name], DPM  colchicine 0.6 MG tablet Take 0.6 mg by mouth as needed (gout flare up).     [provider]  Continuous Blood Gluc Receiver (FREESTYLE LIBRE READER) DEVI 1 each by Does not apply route 4 (four) times daily -  before meals and at bedtime. 06/18/18   [name], [name]  Continuous Blood Gluc Sensor (FREESTYLE LIBRE 14 DAY SENSOR) MISC 1 each by Does not apply route every 14 (fourteen) days. 06/20/18   [name], [name]  diltiazem (CARDIZEM CD) 300 MG 24 hr  capsule TAKE ONE CAPSULE EACH DAY 07/16/18   [name], [name]  dorzolamide (TRUSOPT) 2 % ophthalmic solution Place 1 drop into both eyes 2 (two) times daily. 06/20/18   [provider]  ferrous sulfate 325 (65 FE) MG tablet Take 325 mg by mouth daily with breakfast.    [provider]  fish oil-omega-3 fatty acids 1000 MG capsule Take 2 g by mouth daily.    [provider]  furosemide (LASIX) 40 MG tablet Take 1 tablet by mouth twice a day 05/23/18   [name], [name]  gabapentin (NEURONTIN) 100 MG capsule Take 1 capsule (100 mg total) by mouth 3 (three) times daily. 06/18/18 06/18/19  [name], [name]  hydrocortisone (ANUSOL-HC) 25 MG suppository Place 25 mg rectally 2 (two) times daily as needed for hemorrhoids or anal itching.    [provider]  Insulin Aspart (NOVOLOG [place]) Inject 42-48 Units into the skin 2 (two) times daily. 48 in the am and 42 at night.    [provider]  Insulin Detemir (LEVEMIR FLEXTOUCH) 100 UNIT/ML Pen Inject 20 Units into the skin at bedtime. 04/20/18   [name], [name]  ketotifen (ALAWAY) 0.025 % ophthalmic solution Place 1 drop into both eyes 2 (two) times daily.    [provider]  Lancet Devices ([place] NEXT LANCING DEVICE) MISC by Does not apply route. Check blood sugar 3 times per day    [provider]  losartan (COZAAR) 100 MG tablet Take 1 tablet (100 mg total) by mouth daily. 06/18/18   [name], [name]  Multiple Vitamin (MULTIVITAMIN) tablet Take 1 tablet by mouth daily.    [provider]  NOVOLOG FLEXPEN 100 UNIT/ML FlexPen Inject subcutaneously 42 units every morning and 42 units every evening (max dose) 04/17/18   [name], [name]  pantoprazole (PROTONIX) 40 MG tablet Take 1 tablet (40 mg total) by mouth daily. 06/18/18   [name], [name]  Polyvinyl Alcohol-Povidone (REFRESH OP) Apply 1 drop to eye 2 (two) times daily.    [provider]  pramoxine-hydrocortisone (PRAMOSONE)  cream Apply topically 3 (three) times daily. Apply topically to hemorrhoids four times daily as needed after bowel movements, for external use only    [provider]  PRESCRIPTION MEDICATION Place 1 drop into both eyes 2 (two) times daily. For glucoma    [provider]  rosuvastatin (CRESTOR) 10 MG tablet Take 1 tablet (10 mg total) by mouth daily. 06/18/18   [name], [name]  Semaglutide,0.25 or 0.5MG /DOS, (OZEMPIC, 0.25 OR 0.5 MG/DOSE,) 2 MG/1.5ML SOPN Inject into the skin. Inject 0.5mg  by subcutaneous route every week on the same day of each week in the abdomen,thighs or upper arm sites    [provider]    Family History Family History  Problem  Relation Age of Onset  . Diabetes Mother   . Heart attack Father   . Cancer Sister     Social History Social History   Tobacco Use  . Smoking status: Former Smoker    Types: Cigars    Last attempt to quit: 06/12/2001    Years since quitting: 17.2  . Smokeless tobacco: Never Used  Substance Use Topics  . Alcohol use: No    Alcohol/week: 0.0 standard drinks  . Drug use: No     Allergies   Patient has no known allergies.   Review of Systems Review of Systems  Constitutional: Negative for fever.  Respiratory: Negative for cough and shortness of breath.   Cardiovascular: Negative for chest pain.  Gastrointestinal: Negative for abdominal pain.  Skin: Positive for wound.  All other systems reviewed and are negative.    Physical Exam Updated Vital Signs BP (!) 152/78   Pulse (!) 102   Temp 98.1 F (36.7 C) (Oral)   Resp 15   SpO2 97%   Physical Exam CONSTITUTIONAL: Elderly, no acute distress HEAD: Normocephalic/atraumatic EYES: EOMI ENMT: Mucous membranes moist NECK: supple no meningeal signs SPINE/BACK:entire spine nontender CV: S1/S2 noted LUNGS: Lungs are clear to auscultation bilaterally, no apparent distress ABDOMEN: soft, nontender, no rebound or guarding, bowel sounds noted  throughout abdomen GU:no cva tenderness NEURO: Pt is awake/alert/appropriate, moves all extremitiesx4.  No facial droop.   EXTREMITIES: Right BKA/ prosthetic noted Left leg-see photos below.  No crepitus.  No calf tenderness.  No wounds noted to left foot on plantar surface or in the webspaces. He has full range of motion of left knee and left ankle without difficulty. No Signs of trauma. SKIN: warm, color normal PSYCH: no abnormalities of mood noted, alert and oriented to situation      Patient gave verbal permission to utilize photo for medical documentation only The image was not stored on any personal device ED Treatments / Results  Labs (all labs ordered are listed, but only abnormal results are displayed) Labs Reviewed  BASIC METABOLIC PANEL - Abnormal; Notable for the following components:      Result Value   Glucose, Bld 126 (*)    BUN 64 (*)    Creatinine, Ser 2.71 (*)    GFR calc non Af Amer 21 (*)    GFR calc Af Amer 25 (*)    All other components within normal limits  CBC WITH DIFFERENTIAL/PLATELET - Abnormal; Notable for the following components:   RBC 3.73 (*)    Hemoglobin 11.4 (*)    HCT 35.8 (*)    RDW 15.6 (*)    All other components within normal limits    EKG None  Radiology No results found.  Procedures Procedures   Medications Ordered in ED Medications  cephALEXin (KEFLEX) capsule 500 mg (500 mg Oral Given 09/12/18 1909)  doxycycline (VIBRA-TABS) tablet 100 mg (100 mg Oral Given 09/12/18 1909)     Initial Impression / Assessment and Plan / ED Course  I have reviewed the triage vital signs and the nursing notes.  Pertinent labs  results that were available during my care of the patient were reviewed by me and considered in my medical decision making (see chart for details).        6:37 PM Patient stable and not septic appearing. Plan to check labs and reassess.  No signs of trauma.  I do not see any indication to do any imaging.  Low  suspicion for DVT as  he had swelling for a while and has been improving with furosemide Attempted to call family member at his request, but they did not answer and voicemail full 7:26 PM Labs Near baseline.  Patient is otherwise in no acute distress. BP 128/70   Pulse 95   Temp 98.1 F (36.7 C) (Oral)   Resp 13   SpO2 94%  He is appropriate for outpatient management. Discussed the case with his goddaughter via phone with patient permission. She reports the urgent care indicated he may have an issue with his blood vessels in his leg.  I am assuming that means a DVT, but there is no corroborating paperwork from the urgent care. I have low suspicion for DVT as this is been ongoing for over a month.  However will order a next-day ultrasound study for the patient. Patient and goddaughter understand to arrive tomorrow morning for an outpatient DVT study We will also place patient on Keflex and doxycycline for potential cellulitis with MRSA involvement No signs of any joint involvement.  No crepitus.  Patient is nontoxic and well-appearing.   Final Clinical Impressions(s) / ED Diagnoses   Final diagnoses:  Cellulitis of left leg    ED Discharge Orders         Ordered    LE VENOUS     09/12/18 1920    cephALEXin (KEFLEX) 500 MG capsule  2 times daily     09/12/18 1922    doxycycline (VIBRAMYCIN) 100 MG capsule  2 times daily     09/12/18 Okey Regal, MD 09/12/18 1927

## 2018-09-13 ENCOUNTER — Ambulatory Visit (HOSPITAL_COMMUNITY)
Admission: RE | Admit: 2018-09-13 | Discharge: 2018-09-13 | Disposition: A | Discharge status: Home / Self Care | Payer: Medicare Other | Source: Ambulatory Visit | Attending: Emergency Medicine | Admitting: Emergency Medicine

## 2018-09-13 DIAGNOSIS — R52 Pain, unspecified: Secondary | ICD-10-CM

## 2018-09-13 DIAGNOSIS — M79605 Pain in left leg: Secondary | ICD-10-CM | POA: Insufficient documentation

## 2018-09-13 DIAGNOSIS — M7989 Other specified soft tissue disorders: Secondary | ICD-10-CM | POA: Insufficient documentation

## 2018-09-13 NOTE — Progress Notes (Signed)
Left lower extremity venous duplex has been completed. Preliminary results can be found in CV Proc through chart review.   09/13/18 9:43 AM Carlos Levering RVT

## 2018-09-14 ENCOUNTER — Ambulatory Visit: Payer: Medicare Other | Admitting: Cardiovascular Disease

## 2018-09-17 ENCOUNTER — Ambulatory Visit (INDEPENDENT_AMBULATORY_CARE_PROVIDER_SITE_OTHER): Payer: Medicare Other | Admitting: Nurse Practitioner

## 2018-09-17 ENCOUNTER — Encounter: Payer: Self-pay | Admitting: Nurse Practitioner

## 2018-09-17 ENCOUNTER — Ambulatory Visit: Payer: Medicare Other | Admitting: Nurse Practitioner

## 2018-09-17 ENCOUNTER — Other Ambulatory Visit: Payer: Self-pay

## 2018-09-17 VITALS — BP 150/82 | HR 88 | Temp 98.3°F

## 2018-09-17 DIAGNOSIS — E782 Mixed hyperlipidemia: Secondary | ICD-10-CM

## 2018-09-17 DIAGNOSIS — Z794 Long term (current) use of insulin: Secondary | ICD-10-CM

## 2018-09-17 DIAGNOSIS — I10 Essential (primary) hypertension: Secondary | ICD-10-CM | POA: Diagnosis not present

## 2018-09-17 DIAGNOSIS — I739 Peripheral vascular disease, unspecified: Secondary | ICD-10-CM

## 2018-09-17 DIAGNOSIS — E1159 Type 2 diabetes mellitus with other circulatory complications: Secondary | ICD-10-CM

## 2018-09-17 DIAGNOSIS — Z89511 Acquired absence of right leg below knee: Secondary | ICD-10-CM | POA: Diagnosis not present

## 2018-09-17 NOTE — Progress Notes (Addendum)
Subjective:     Patient ID: Micheal Walls , male    DOB: 06-11-1936 , 83 y.o.   MRN: [id number]   Chief Complaint  Patient presents with  . Diabetes    HPI  Novolog - 32 units twice a day and Levemir 20 units at bedtime.     Left lower extremity ulcer.  Seen at ER last week for   Diabetes  He presents for his follow-up diabetic visit. He has type 2 diabetes mellitus. His disease course has been improving. Pertinent negatives for hypoglycemia include no confusion, dizziness, headaches or nervousness/anxiousness. Pertinent negatives for diabetes include no blurred vision, no fatigue, no polydipsia, no polyphagia and no polyuria. There are no hypoglycemic complications. Symptoms are improving. Current diabetic treatment includes oral agent (dual therapy) (he is using [place]). He is compliant with treatment most of the time. He is following a diabetic diet. He rarely participates in exercise. His home blood glucose trend is decreasing steadily. (Blood sugar ranging 70- 186 (only once) otherwise less than 130.  ) An ACE inhibitor/angiotensin II receptor blocker is being taken. He does not see a podiatrist.Eye exam is current (Dr. Radene Ou - [place]).  Hypertension  This is a chronic problem. The current episode started more than 1 year ago. The problem is unchanged. The problem is uncontrolled. Associated symptoms include peripheral edema (left lower extremity). Pertinent negatives include no anxiety, blurred vision or headaches. There are no associated agents to hypertension. Risk factors for coronary artery disease include obesity and sedentary lifestyle. Past treatments include ACE inhibitors. There are no compliance problems.  Hypertensive end-organ damage includes kidney disease. There is no history of angina. There is no history of chronic renal disease.     Past Medical History:  Diagnosis Date  . Amputated below knee ([place])    right  . Anemia   . Diastolic heart failure ([place])    . Diverticulosis   . DM (diabetes mellitus) ([place])   . Gout   . Hyperlipemia   . OSA on CPAP   . RBBB   . Systemic hypertension      Family History  Problem Relation Age of Onset  . Diabetes Mother   . Heart attack Father   . Cancer Sister      Current Outpatient Medications:  .  allopurinol (ZYLOPRIM) 100 MG tablet, Take 1 tablet (100 mg total) by mouth daily. (Patient taking differently: Take 100 mg by mouth 2 (two) times daily. ), Disp: 90 tablet, Rfl: 1 .  antiseptic oral rinse (BIOTENE) LIQD, 15 mLs by Mouth Rinse route as needed for dry mouth., Disp: , Rfl:  .  aspirin 81 MG tablet, Take 162 mg by mouth daily., Disp: , Rfl:  .  Castellani Paint Modified 1.5 % LIQD, Use a cotton tip applicator. Paint between toes 2, 3, 4, 5 both feet once daily, Disp: 1 Bottle, Rfl: 1 .  cephALEXin (KEFLEX) 500 MG capsule, Take 1 capsule (500 mg total) by mouth 2 (two) times daily., Disp: 14 capsule, Rfl: 0 .  colchicine 0.6 MG tablet, Take 0.6 mg by mouth as needed (gout flare up). , Disp: , Rfl:  .  Continuous Blood Gluc Receiver (FREESTYLE LIBRE READER) DEVI, 1 each by Does not apply route 4 (four) times daily -  before meals and at bedtime., Disp: 1 Device, Rfl: 0 .  Continuous Blood Gluc Sensor (FREESTYLE LIBRE 14 DAY SENSOR) MISC, 1 each by Does not apply route every 14 (fourteen) days., Disp:  6 each, Rfl: 3 .  diltiazem (CARDIZEM CD) 300 MG 24 hr capsule, TAKE ONE CAPSULE EACH DAY, Disp: 90 capsule, Rfl: 1 .  dorzolamide (TRUSOPT) 2 % ophthalmic solution, Place 1 drop into both eyes 2 (two) times daily., Disp: , Rfl:  .  doxycycline (VIBRAMYCIN) 100 MG capsule, Take 1 capsule (100 mg total) by mouth 2 (two) times daily. One po bid x 7 days, Disp: 14 capsule, Rfl: 0 .  ferrous sulfate 325 (65 FE) MG tablet, Take 325 mg by mouth daily with breakfast., Disp: , Rfl:  .  fish oil-omega-3 fatty acids 1000 MG capsule, Take 2 g by mouth daily., Disp: , Rfl:  .  furosemide (LASIX) 40 MG tablet,  Take 1 tablet by mouth twice a day, Disp: 180 tablet, Rfl: 0 .  gabapentin (NEURONTIN) 100 MG capsule, Take 1 capsule (100 mg total) by mouth 3 (three) times daily., Disp: 90 capsule, Rfl: 2 .  hydrocortisone (ANUSOL-HC) 25 MG suppository, Place 25 mg rectally 2 (two) times daily as needed for hemorrhoids or anal itching., Disp: , Rfl:  .  Insulin Aspart (NOVOLOG ), Inject 42-48 Units into the skin 2 (two) times daily. 48 in the am and 42 at night., Disp: , Rfl:  .  Insulin Detemir (LEVEMIR FLEXTOUCH) 100 UNIT/ML Pen, Inject 20 Units into the skin at bedtime., Disp: 30 mL, Rfl: 1 .  ketotifen (ALAWAY) 0.025 % ophthalmic solution, Place 1 drop into both eyes 2 (two) times daily., Disp: , Rfl:  .  Lancet Devices ([place] NEXT LANCING DEVICE) MISC, by Does not apply route. Check blood sugar 3 times per day, Disp: , Rfl:  .  losartan (COZAAR) 100 MG tablet, Take 1 tablet (100 mg total) by mouth daily., Disp: 90 tablet, Rfl: 1 .  Multiple Vitamin (MULTIVITAMIN) tablet, Take 1 tablet by mouth daily., Disp: , Rfl:  .  NOVOLOG FLEXPEN 100 UNIT/ML FlexPen, Inject subcutaneously 42 units every morning and 42 units every evening (max dose), Disp: 5 pen, Rfl: 1 .  pantoprazole (PROTONIX) 40 MG tablet, Take 1 tablet (40 mg total) by mouth daily., Disp: 90 tablet, Rfl: 1 .  Polyvinyl Alcohol-Povidone (REFRESH OP), Apply 1 drop to eye 2 (two) times daily., Disp: , Rfl:  .  pramoxine-hydrocortisone (PRAMOSONE) cream, Apply topically 3 (three) times daily. Apply topically to hemorrhoids four times daily as needed after bowel movements, for external use only, Disp: , Rfl:  .  PRESCRIPTION MEDICATION, Place 1 drop into both eyes 2 (two) times daily. For glucoma, Disp: , Rfl:  .  rosuvastatin (CRESTOR) 10 MG tablet, Take 1 tablet (10 mg total) by mouth daily., Disp: 90 tablet, Rfl: 1 .  Semaglutide,0.25 or 0.5MG /DOS, (OZEMPIC, 0.25 OR 0.5 MG/DOSE,) 2 MG/1.5ML SOPN, Inject into the skin. Inject 0.5mg  by subcutaneous  route every week on the same day of each week in the abdomen,thighs or upper arm sites, Disp: , Rfl:    No Known Allergies   Review of Systems  Constitutional: Negative.  Negative for fatigue.  Eyes: Negative for blurred vision.  Respiratory: Negative.   Cardiovascular: Negative.   Gastrointestinal: Negative.   Endocrine: Negative for polydipsia, polyphagia and polyuria.  Musculoskeletal: Negative.        Right prosthesis for right above the knee amputation  Skin: Negative.   Neurological: Negative.  Negative for dizziness, syncope, light-headedness and headaches.  Psychiatric/Behavioral: Negative for confusion. The patient is not nervous/anxious.      Today's Vitals   09/17/18 0902  BP: Marland Kitchen)  150/82  Pulse: 88  Temp: 98.3 F (36.8 C)  TempSrc: Oral  SpO2: 96%   There is no height or weight on file to calculate BMI.   Objective:  Physical Exam Cardiovascular:     Rate and Rhythm: Normal rate and regular rhythm.     Pulses: Normal pulses.     Heart sounds: Normal heart sounds. No murmur.  Pulmonary:     Effort: Pulmonary effort is normal.     Breath sounds: Normal breath sounds.  Musculoskeletal:        General: No tenderness.     Comments: Right above the knee amputation.  Left lower extremity with 1+ nonpitting edema.  Skin:    General: Skin is warm and dry.     Capillary Refill: Capillary refill takes less than 2 seconds.     Findings: Lesion (2 open lesions to left lower extremity) present.  Neurological:     General: No focal deficit present.  Psychiatric:        Mood and Affect: Mood normal.         Assessment And Plan:     1. Type 2 diabetes mellitus with other circulatory complication, with long-term current use of insulin (HCC)  Chronic, blood sugars are improving controlled  Continue with current medications  Encouraged to limit intake of sugary foods and drinks  Encouraged to increase physical activity to 150 minutes per week - Lipid panel -  CMP14 + Anion Gap - Hemoglobin A1c - TSH  2. Mixed hyperlipidemia  Chronic, controlled  Continue with current medications - Lipid panel - CMP14 + Anion Gap - Hemoglobin A1c - TSH  3. Essential hypertension . B/P is poorly controlled, slightly elevated today . Encouraged to make sure he is staying well hydrated  . CMP ordered to check renal function.  . The importance of regular exercise and dietary modification was stressed to the patient.  . Stressed importance of losing ten percent of her body weight to help with B/P control.  . The weight loss would help with decreasing cardiac and cancer risk as well.   4. PAD (peripheral artery disease) ([place])  Will refer back vein and vascular  He has 2 open vascular wounds to left lower extremity, cleansed with normal saline, dressed with non stick dressing.        [name], [name]    THE PATIENT IS ENCOURAGED TO PRACTICE SOCIAL DISTANCING DUE TO THE COVID-19 PANDEMIC.

## 2018-09-18 LAB — HEMOGLOBIN A1C
Est. average glucose Bld gHb Est-mCnc: 126 mg/dL
Hgb A1c MFr Bld: 6 % — ABNORMAL HIGH (ref 4.8–5.6)

## 2018-09-18 LAB — CMP14 + ANION GAP
ALT: 20 IU/L (ref 0–44)
AST: 24 IU/L (ref 0–40)
Albumin/Globulin Ratio: 1.4 (ref 1.2–2.2)
Albumin: 3.9 g/dL (ref 3.7–4.7)
Alkaline Phosphatase: 71 IU/L (ref 39–117)
Anion Gap: 14 mmol/L (ref 10.0–18.0)
BUN/Creatinine Ratio: 24 (ref 10–24)
BUN: 60 mg/dL — ABNORMAL HIGH (ref 8–27)
Bilirubin Total: 0.5 mg/dL (ref 0.0–1.2)
CO2: 22 mmol/L (ref 20–29)
Calcium: 10.2 mg/dL (ref 8.6–10.2)
Chloride: 103 mmol/L (ref 96–106)
Creatinine, Ser: 2.51 mg/dL — ABNORMAL HIGH (ref 0.76–1.27)
GFR calc Af Amer: 27 mL/min/{1.73_m2} — ABNORMAL LOW (ref >59)
GFR calc non Af Amer: 23 mL/min/{1.73_m2} — ABNORMAL LOW (ref >59)
Globulin, Total: 2.8 g/dL (ref 1.5–4.5)
Glucose: 135 mg/dL — ABNORMAL HIGH (ref 65–99)
Potassium: 4.8 mmol/L (ref 3.5–5.2)
Sodium: 139 mmol/L (ref 134–144)
Total Protein: 6.7 g/dL (ref 6.0–8.5)

## 2018-09-18 LAB — LIPID PANEL
Chol/HDL Ratio: 2.3 ratio (ref 0.0–5.0)
Cholesterol, Total: 84 mg/dL — ABNORMAL LOW (ref 100–199)
HDL: 36 mg/dL — ABNORMAL LOW (ref >39)
LDL Calculated: 37 mg/dL (ref 0–99)
Triglycerides: 57 mg/dL (ref 0–149)
VLDL Cholesterol Cal: 11 mg/dL (ref 5–40)

## 2018-09-18 LAB — TSH: TSH: 4.31 u[IU]/mL (ref 0.450–4.500)

## 2018-09-19 NOTE — Progress Notes (Signed)
Please fax them a copy of the labs thank you

## 2018-09-26 DIAGNOSIS — N183 Chronic kidney disease, stage 3 (moderate): Secondary | ICD-10-CM | POA: Diagnosis not present

## 2018-09-26 DIAGNOSIS — L089 Local infection of the skin and subcutaneous tissue, unspecified: Secondary | ICD-10-CM | POA: Diagnosis not present

## 2018-09-26 DIAGNOSIS — Z6838 Body mass index (BMI) 38.0-38.9, adult: Secondary | ICD-10-CM | POA: Diagnosis not present

## 2018-09-26 DIAGNOSIS — Z89619 Acquired absence of unspecified leg above knee: Secondary | ICD-10-CM | POA: Diagnosis not present

## 2018-09-26 DIAGNOSIS — E119 Type 2 diabetes mellitus without complications: Secondary | ICD-10-CM | POA: Diagnosis not present

## 2018-09-26 DIAGNOSIS — L97909 Non-pressure chronic ulcer of unspecified part of unspecified lower leg with unspecified severity: Secondary | ICD-10-CM | POA: Diagnosis not present

## 2018-09-30 ENCOUNTER — Encounter: Payer: Self-pay | Admitting: Nurse Practitioner

## 2018-10-03 ENCOUNTER — Ambulatory Visit: Payer: Self-pay

## 2018-10-03 DIAGNOSIS — I10 Essential (primary) hypertension: Secondary | ICD-10-CM

## 2018-10-03 DIAGNOSIS — Z794 Long term (current) use of insulin: Secondary | ICD-10-CM

## 2018-10-03 DIAGNOSIS — N183 Chronic kidney disease, stage 3 unspecified: Secondary | ICD-10-CM

## 2018-10-03 DIAGNOSIS — E782 Mixed hyperlipidemia: Secondary | ICD-10-CM

## 2018-10-03 DIAGNOSIS — E1159 Type 2 diabetes mellitus with other circulatory complications: Secondary | ICD-10-CM

## 2018-10-03 NOTE — Patient Instructions (Signed)
Social Worker Visit Information    Materials provided: No: Patient declined  Mr. Odwyer was given information about Chronic Care Management services today including:  1. CCM service includes personalized support from designated clinical staff supervised by his physician, including individualized plan of care and coordination with other care providers 2. 24/7 contact phone numbers for assistance for urgent and routine care needs. 3. Service will only be billed when office clinical staff spend 20 minutes or more in a month to coordinate care. 4. Only one practitioner may furnish and bill the service in a calendar month. 5. The patient may stop CCM services at any time (effective at the end of the month) by phone call to the office staff. 6. The patient will be responsible for cost sharing (co-pay) of up to 20% of the service fee (after annual deductible is met).  Patient did not agree to services and does not wish to consider at this time.  The patient verbalized understanding of instructions provided today and declined a print copy of patient instruction materials.   Follow up plan: No further follow up needed at this time.   [name], [name], CDP [facility] Care Management Social Worker [phone]

## 2018-10-03 NOTE — Chronic Care Management (AMB) (Signed)
  Chronic Care Management   Telephone Outreach Note  10/03/2018 Name: Micheal Walls MRN: [id number] DOB: 04-07-1936  Referred by: patient's health plan.   I reached out to Mr. [name] today by phone in response to a referral sent by Mr. Earna Coder Pattillo's health plan. Mr. FIDEL CAGGIANO and I briefly discussed care management needs related to DMII.  Mr. Hlavaty was given information about Chronic Care Management services today including:  1. CCM service includes personalized support from designated clinical staff supervised by his physician, including individualized plan of care and coordination with other care providers 2. 24/7 contact phone numbers for assistance for urgent and routine care needs. 3. Service will only be billed when office clinical staff spend 20 minutes or more in a month to coordinate care. 4. Only one practitioner may furnish and bill the service in a calendar month. 5. The patient may stop CCM services at any time (effective at the end of the month) by phone call to the office staff. 6. The patient will be responsible for cost sharing (co-pay) of up to 20% of the service fee (after annual deductible is met).  Patient did not agree to services and does not wish to consider at this time. The patient reports "I am already enrolled in a program for my diabetes".  No further follow up required: SW encouraged the patient to discuss future referral to the CCM program if desired.   [name], [name] has been notified of this outreach and Mr. Ernst Cumpston [facility] decision and plan.   [name], [name], CDP [facility] Care Management Social Worker [phone]  Total time spent performing care coordination and/or care management activities with the patient by phone or face to face = 10 minutes.

## 2018-10-05 ENCOUNTER — Other Ambulatory Visit: Payer: Self-pay

## 2018-10-05 DIAGNOSIS — I739 Peripheral vascular disease, unspecified: Secondary | ICD-10-CM

## 2018-10-08 ENCOUNTER — Other Ambulatory Visit: Payer: Self-pay

## 2018-10-08 DIAGNOSIS — I739 Peripheral vascular disease, unspecified: Secondary | ICD-10-CM

## 2018-10-09 ENCOUNTER — Ambulatory Visit (INDEPENDENT_AMBULATORY_CARE_PROVIDER_SITE_OTHER): Payer: Medicare Other | Admitting: Podiatry

## 2018-10-09 ENCOUNTER — Other Ambulatory Visit: Payer: Self-pay

## 2018-10-09 ENCOUNTER — Encounter: Payer: Self-pay | Admitting: Podiatry

## 2018-10-09 DIAGNOSIS — M79676 Pain in unspecified toe(s): Secondary | ICD-10-CM

## 2018-10-09 DIAGNOSIS — Z89511 Acquired absence of right leg below knee: Secondary | ICD-10-CM

## 2018-10-09 DIAGNOSIS — B351 Tinea unguium: Secondary | ICD-10-CM | POA: Diagnosis not present

## 2018-10-09 DIAGNOSIS — E1151 Type 2 diabetes mellitus with diabetic peripheral angiopathy without gangrene: Secondary | ICD-10-CM

## 2018-10-09 DIAGNOSIS — B353 Tinea pedis: Secondary | ICD-10-CM

## 2018-10-09 MED ORDER — CASTELLANI PAINT MODIFIED 1.5 % EX LIQD: CUTANEOUS | 1 refills | Status: DC

## 2018-10-09 NOTE — Patient Instructions (Addendum)
Athlete's Foot  Athlete's foot (tinea pedis) is a fungal infection of the skin on your feet. It often occurs on the skin that is between or underneath the toes. It can also occur on the soles of your feet. The infection can spread from person to person (is contagious). It can also spread when a person's bare feet come in contact with the fungus on shower floors or on items such as shoes. What are the causes? This condition is caused by a fungus that grows in warm, moist places. You can get athlete's foot by sharing shoes, shower stalls, towels, and wet floors with someone who is infected. Not washing your feet or changing your socks often enough can also lead to athlete's foot. What increases the risk? This condition is more likely to develop in:  Men.  People who have a weak body defense system (immune system).  People who have diabetes.  People who use public showers, such as at a gym.  People who wear heavy-duty shoes, such as Environmental manager.  Seasons with warm, humid weather. What are the signs or symptoms? Symptoms of this condition include:  Itchy areas between your toes or on the soles of your feet.  White, flaky, or scaly areas between your toes or on the soles of your feet.  Very itchy small blisters between your toes or on the soles of your feet.  Small cuts in your skin. These cuts can become infected.  Thick or discolored toenails. How is this diagnosed? This condition may be diagnosed with a physical exam and a review of your medical history. Your health care provider may also take a skin or toenail sample to examine under a microscope. How is this treated? This condition is treated with antifungal medicines. These may be applied as powders, ointments, or creams. In severe cases, an oral antifungal medicine may be given. Follow these instructions at home: Medicines  Apply or take over-the-counter and prescription medicines only as told by your health  care provider.  Apply your antifungal medicine as told by your health care provider. Do not stop using the antifungal even if your condition improves. Foot care  Do not scratch your feet.  Keep your feet dry: ? Wear cotton or wool socks. Change your socks every day or if they become wet. ? Wear shoes that allow air to flow, such as sandals or canvas tennis shoes.  Wash and dry your feet, including the area between your toes. Also, wash and dry your feet: ? Every day or as told by your health care provider. ? After exercising. General instructions  Do not let others use towels, shoes, nail clippers, or other personal items that touch your feet.  Protect your feet by wearing sandals in wet areas, such as locker rooms and shared showers.  Keep all follow-up visits as told by your health care provider. This is important.  If you have diabetes, keep your blood sugar under control. Contact a health care provider if:  You have a fever.  You have swelling, soreness, warmth, or redness in your foot.  Your feet are not getting better with treatment.  Your symptoms get worse.  You have new symptoms. Summary  Athlete's foot (tinea pedis) is a fungal infection of the skin on your feet. It often occurs on skin that is between or underneath the toes.  This condition is caused by a fungus that grows in warm, moist places.  Symptoms include white, flaky, or scaly areas between  your toes or on the soles of your feet.  This condition is treated with antifungal medicines.  Keep your feet clean. Always dry them thoroughly. This information is not intended to replace advice given to you by your health care provider. Make sure you discuss any questions you have with your health care provider. Document Released: 05/27/2000 Document Revised: 03/20/2017 Document Reviewed: 03/20/2017 Elsevier Interactive Patient Education  2019 [employer]   Onychomycosis/Fungal Toenails  WHAT IS IT? An  infection that lies within the keratin of your nail plate that is caused by a fungus.  WHY ME? Fungal infections affect all ages, sexes, races, and creeds.  There may be many factors that predispose you to a fungal infection such as age, coexisting medical conditions such as diabetes, or an autoimmune disease; stress, medications, fatigue, genetics, etc.  Bottom line: fungus thrives in a warm, moist environment and your shoes offer such a location.  IS IT CONTAGIOUS? Theoretically, yes.  You do not want to share shoes, nail clippers or files with someone who has fungal toenails.  Walking around barefoot in the same room or sleeping in the same bed is unlikely to transfer the organism.  It is important to realize, however, that fungus can spread easily from one nail to the next on the same foot.  HOW DO WE TREAT THIS?  There are several ways to treat this condition.  Treatment may depend on many factors such as age, medications, pregnancy, liver and kidney conditions, etc.  It is best to ask your doctor which options are available to you.  1. No treatment.   Unlike many other medical concerns, you can live with this condition.  However for many people this can be a painful condition and may lead to ingrown toenails or a bacterial infection.  It is recommended that you keep the nails cut short to help reduce the amount of fungal nail. 2. Topical treatment.  These range from herbal remedies to prescription strength nail lacquers.  About 40-50% effective, topicals require twice daily application for approximately 9 to 12 months or until an entirely new nail has grown out.  The most effective topicals are medical grade medications available through physicians offices. 3. Oral antifungal medications.  With an 80-90% cure rate, the most common oral medication requires 3 to 4 months of therapy and stays in your system for a year as the new nail grows out.  Oral antifungal medications do require blood work to make  sure it is a safe drug for you.  A liver function panel will be performed prior to starting the medication and after the first month of treatment.  It is important to have the blood work performed to avoid any harmful side effects.  In general, this medication safe but blood work is required. 4. Laser Therapy.  This treatment is performed by applying a specialized laser to the affected nail plate.  This therapy is noninvasive, fast, and non-painful.  It is not covered by insurance and is therefore, out of pocket.  The results have been very good with a 80-95% cure rate.  [facility] is the only practice in the area to offer this therapy. 5. Permanent Nail Avulsion.  Removing the entire nail so that a new nail will not grow back.  Diabetes Mellitus and Foot Care Foot care is an important part of your health, especially when you have diabetes. Diabetes may cause you to have problems because of poor blood flow (circulation) to your  feet and legs, which can cause your skin to:  Become thinner and drier.  Break more easily.  Heal more slowly.  Peel and crack. You may also have nerve damage (neuropathy) in your legs and feet, causing decreased feeling in them. This means that you may not notice minor injuries to your feet that could lead to more serious problems. Noticing and addressing any potential problems early is the best way to prevent future foot problems. How to care for your feet Foot hygiene  Wash your feet daily with warm water and mild soap. Do not use hot water. Then, pat your feet and the areas between your toes until they are completely dry. Do not soak your feet as this can dry your skin.  Trim your toenails straight across. Do not dig under them or around the cuticle. File the edges of your nails with an emery board or nail file.  Apply a moisturizing lotion or petroleum jelly to the skin on your feet and to dry, brittle toenails. Use lotion that does not contain alcohol  and is unscented. Do not apply lotion between your toes. Shoes and socks  Wear clean socks or stockings every day. Make sure they are not too tight. Do not wear knee-high stockings since they may decrease blood flow to your legs.  Wear shoes that fit properly and have enough cushioning. Always look in your shoes before you put them on to be sure there are no objects inside.  To break in new shoes, wear them for just a few hours a day. This prevents injuries on your feet. Wounds, scrapes, corns, and calluses  Check your feet daily for blisters, cuts, bruises, sores, and redness. If you cannot see the bottom of your feet, use a mirror or ask someone for help.  Do not cut corns or calluses or try to remove them with medicine.  If you find a minor scrape, cut, or break in the skin on your feet, keep it and the skin around it clean and dry. You may clean these areas with mild soap and water. Do not clean the area with peroxide, alcohol, or iodine.  If you have a wound, scrape, corn, or callus on your foot, look at it several times a day to make sure it is healing and not infected. Check for: ? Redness, swelling, or pain. ? Fluid or blood. ? Warmth. ? Pus or a bad smell. General instructions  Do not cross your legs. This may decrease blood flow to your feet.  Do not use heating pads or hot water bottles on your feet. They may burn your skin. If you have lost feeling in your feet or legs, you may not know this is happening until it is too late.  Protect your feet from hot and cold by wearing shoes, such as at the beach or on hot pavement.  Schedule a complete foot exam at least once a year (annually) or more often if you have foot problems. If you have foot problems, report any cuts, sores, or bruises to your health care provider immediately. Contact a health care provider if:  You have a medical condition that increases your risk of infection and you have any cuts, sores, or bruises on your  feet.  You have an injury that is not healing.  You have redness on your legs or feet.  You feel burning or tingling in your legs or feet.  You have pain or cramps in your legs and feet.  Your legs or feet are numb.  Your feet always feel cold.  You have pain around a toenail. Get help right away if:  You have a wound, scrape, corn, or callus on your foot and: ? You have pain, swelling, or redness that gets worse. ? You have fluid or blood coming from the wound, scrape, corn, or callus. ? Your wound, scrape, corn, or callus feels warm to the touch. ? You have pus or a bad smell coming from the wound, scrape, corn, or callus. ? You have a fever. ? You have a red line going up your leg. Summary  Check your feet every day for cuts, sores, red spots, swelling, and blisters.  Moisturize feet and legs daily.  Wear shoes that fit properly and have enough cushioning.  If you have foot problems, report any cuts, sores, or bruises to your health care provider immediately.  Schedule a complete foot exam at least once a year (annually) or more often if you have foot problems. This information is not intended to replace advice given to you by your health care provider. Make sure you discuss any questions you have with your health care provider. Document Released: 05/27/2000 Document Revised: 07/12/2017 Document Reviewed: 07/01/2016 Elsevier Interactive Patient Education  2019 [employer]  Diabetic Neuropathy Diabetic neuropathy refers to nerve damage that is caused by diabetes (diabetes mellitus). Over time, people with diabetes can develop nerve damage throughout the body. There are several types of diabetic neuropathy:  Peripheral neuropathy. This is the most common type of diabetic neuropathy. It causes damage to nerves that carry signals between the spinal cord and other parts of the body (peripheral nerves). This usually affects nerves in the feet and legs first, and may  eventually affect the hands and arms. The damage affects the ability to sense touch or temperature.  Autonomic neuropathy. This type causes damage to nerves that control involuntary functions (autonomic nerves). These nerves carry signals that control: ? Heartbeat. ? Body temperature. ? Blood pressure. ? Urination. ? Digestion. ? Sweating. ? Sexual function. ? Response to changing blood sugar (glucose) levels.  Focal neuropathy. This type of nerve damage affects one area of the body, such as an arm, a leg, or the face. The injury may involve one nerve or a small group of nerves. Focal neuropathy can be painful and unpredictable, and occurs most often in older adults with diabetes. This often develops suddenly, but usually improves over time and does not cause long-term problems.  Proximal neuropathy. This type of nerve damage affects the nerves of the thighs, hips, buttocks, or legs. It causes severe pain, weakness, and muscle death (atrophy), usually in the thigh muscles. It is more common among older men and people who have type 2 diabetes. The length of recovery time may vary. What are the causes? Peripheral, autonomic, and focal neuropathies are caused by diabetes that is not well controlled with treatment. The cause of proximal neuropathy is not known, but it may be caused by inflammation related to uncontrolled blood glucose levels. What are the signs or symptoms? Peripheral neuropathy Peripheral neuropathy develops slowly over time. When the nerves of the feet and legs no longer work, you may experience:  Burning, stabbing, or aching pain in the legs or feet.  Pain or cramping in the legs or feet.  Loss of feeling (numbness) and inability to feel pressure or pain in the feet. This can lead to: ? Thick calluses or sores on areas of constant pressure. ? Ulcers. ?  Reduced ability to feel temperature changes.  Foot deformities.  Muscle weakness.  Loss of balance or coordination.  Autonomic neuropathy The symptoms of autonomic neuropathy vary depending on which nerves are affected. Symptoms may include:  Problems with digestion, such as: ? Nausea or vomiting. ? Poor appetite. ? Bloating. ? Diarrhea or constipation. ? Trouble swallowing. ? Losing weight without trying to.  Problems with the heart, blood and lungs, such as: ? Dizziness, especially when standing up. ? Fainting. ? Shortness of breath. ? Irregular heartbeat.  Bladder problems, such as: ? Trouble starting or stopping urination. ? Leaking urine. ? Trouble emptying the bladder. ? Urinary tract infections (UTIs).  Problems with other body functions, such as: ? Sweat. You may sweat too much or too little. ? Temperature. You might get hot easily. Or, you might feel cold more than usual. ? Sexual function. Men may not be able to get or maintain an erection. Women may have vaginal dryness and difficulty with arousal. Focal neuropathy Symptoms affect only one area of the body. Common symptoms include:  Numbness.  Tingling.  Burning pain.  Prickling feeling.  Very sensitive skin.  Weakness.  Inability to move (paralysis).  Muscle twitching.  Muscles getting smaller (wasting).  Poor coordination.  Double or blurred vision. Proximal neuropathy  Sudden, severe pain in the hip, thigh, or buttocks. Pain may spread from the back into the legs (sciatica).  Pain and numbness in the arms and legs.  Tingling.  Loss of bladder control or bowel control.  Weakness and wasting of thigh muscles.  Difficulty getting up from a seated position.  Abdominal swelling.  Unexplained weight loss. How is this diagnosed? Diagnosis usually involves reviewing your medical history and any symptoms you have. Diagnosis varies depending on the type of neuropathy your health care provider suspects. Peripheral neuropathy Your health care provider will check areas that are affected by your nervous system  (neurologic exam), such as your reflexes, how you move, and what you can feel. You may have other tests, such as:  Blood tests.  Removal and examination of fluid that surrounds the spinal cord (lumbar puncture).  CT scan.  MRI.  A test to check the nerves that control muscles (electromyogram, EMG).  Tests of how quickly messages pass through your nerves (nerve conduction velocity tests).  Removal of a small piece of nerve to be examined under a microscope (biopsy). Autonomic neuropathy You may have tests, such as:  Tests to measure your blood pressure and heart rate. This may include monitoring you while you are safely secured to an exam table that moves you from a lying position to an upright position (table tilt test).  Breathing tests to check your lungs.  Tests to check how food moves through the digestive system (gastric emptying tests).  Blood, sweat, or urine tests.  Ultrasound of your bladder.  Spinal fluid tests. Focal neuropathy This condition may be diagnosed with:  A neurologic exam.  CT scan.  MRI.  EMG.  Nerve conduction velocity tests. Proximal neuropathy There is no test to diagnose this type of neuropathy. You may have tests to rule out other possible causes of this type of neuropathy. Tests may include:  X-rays of your spine and lumbar region.  Lumbar puncture.  MRI. How is this treated? The goal of treatment is to keep nerve damage from getting worse. The most important part of treatment is keeping your blood glucose level and your A1C level within your target range by following your diabetes  management plan. Over time, maintaining lower blood glucose levels helps lessen symptoms. In some cases, you may need prescription pain medicine. Follow these instructions at home:  Lifestyle   Do not use any products that contain nicotine or tobacco, such as cigarettes and e-cigarettes. If you need help quitting, ask your health care provider.  Be  physically active every day. Include strength training and balance exercises.  Follow a healthy meal plan.  Work with your health care provider to manage your blood pressure. General instructions  Follow your diabetes management plan as directed. ? Check your blood glucose levels as directed by your health care provider. ? Keep your blood glucose in your target range as directed by your health care provider. ? Have your A1C level checked at least two times a year, or as often as told by your health care provider.  Take over the counter and prescription medicines only as told by your health care provider. This includes insulin and diabetes medicine.  Do not drive or use heavy machinery while taking prescription pain medicines.  Check your skin and feet every day for cuts, bruises, redness, blisters, or sores.  Keep all follow up visits as told by your health care provider. This is important. Contact a health care provider if:  You have burning, stabbing, or aching pain in your legs or feet.  You are unable to feel pressure or pain in your feet.  You develop problems with digestion, such as: ? Nausea. ? Vomiting. ? Bloating. ? Constipation. ? Diarrhea. ? Abdominal pain.  You have difficulty with urination, such as inability: ? To control when you urinate (incontinence). ? To completely empty the bladder (retention).  You have palpitations.  You feel dizzy, weak, or faint when you stand up. Get help right away if:  You cannot urinate.  You have sudden weakness or loss of coordination.  You have trouble speaking.  You have pain or pressure in your chest.  You have an irregular heart beat.  You have sudden inability to move a part of your body. Summary  Diabetic neuropathy refers to nerve damage that is caused by diabetes. It can affect nerves throughout the entire body, causing numbness and pain in the arms, legs, digestive tract, heart, and other body systems.   Keep your blood glucose level and your blood pressure in your target range, as directed by your health care provider. This can help prevent neuropathy from getting worse.  Check your skin and feet every day for cuts, bruises, redness, blisters, or sores.  Do not use any products that contain nicotine or tobacco, such as cigarettes and e-cigarettes. If you need help quitting, ask your health care provider. This information is not intended to replace advice given to you by your health care provider. Make sure you discuss any questions you have with your health care provider. Document Released: 08/08/2001 Document Revised: 07/12/2017 Document Reviewed: 07/04/2016 [employer]vier Interactive Patient Education  2019 [employer].

## 2018-10-10 ENCOUNTER — Encounter: Payer: Self-pay | Admitting: Vascular Surgery

## 2018-10-10 ENCOUNTER — Ambulatory Visit (INDEPENDENT_AMBULATORY_CARE_PROVIDER_SITE_OTHER): Payer: Medicare Other | Admitting: Vascular Surgery

## 2018-10-10 ENCOUNTER — Other Ambulatory Visit: Payer: Self-pay

## 2018-10-10 ENCOUNTER — Ambulatory Visit (HOSPITAL_COMMUNITY)
Admission: RE | Admit: 2018-10-10 | Discharge: 2018-10-10 | Disposition: A | Discharge status: Home / Self Care | Payer: Medicare Other | Source: Ambulatory Visit | Attending: Vascular Surgery | Admitting: Vascular Surgery

## 2018-10-10 VITALS — BP 128/59 | HR 89 | Resp 20 | Ht 73.0 in | Wt 300.0 lb

## 2018-10-10 DIAGNOSIS — I70299 Other atherosclerosis of native arteries of extremities, unspecified extremity: Secondary | ICD-10-CM

## 2018-10-10 DIAGNOSIS — I739 Peripheral vascular disease, unspecified: Secondary | ICD-10-CM | POA: Insufficient documentation

## 2018-10-10 DIAGNOSIS — L97909 Non-pressure chronic ulcer of unspecified part of unspecified lower leg with unspecified severity: Secondary | ICD-10-CM | POA: Diagnosis not present

## 2018-10-10 NOTE — Progress Notes (Signed)
REASON FOR CONSULT:    Peripheral vascular disease with a left lower extremity ulcer.  The consult is requested by [name], [name].  ASSESSMENT & PLAN:   PERIPHERAL VASCULAR DISEASE WITH ULCERATION: This patient has a healing venous ulcer of the left leg with underlying infrainguinal arterial occlusive disease.  Given that the toe pressure is 60 mmHg, I think that the patient does have adequate circulation to heal this wound although the perfusion is somewhat borderline.  As long as the wound is gradually getting better I would not recommend arteriography.  In addition, he has stage IV chronic kidney disease and thus we would likely have to do a CO2 arteriogram which gives poor visualization.  Fortunately, a toe pressure of greater than 50 is generally adequate for healing.  Fortunately he is not a smoker.  He is on aspirin and is on a statin.  I will be happy to see him back at any time if the ulcer fails to heal.  CHRONIC VENOUS INSUFFICIENCY: This patient has CEAP C5 venous disease with an essentially healed venous ulcer of the left leg.  Patient has moderate left lower extremity swelling.  I have discussed with him the importance of intermittent leg elevation and the proper positioning for this.  Once the swelling is down some I have also written him a prescription for knee-high compression stockings with a mild gradient of 15 to 20 mmHg.  I think it would be harder for him to get on a tighter stocking.  We have also discussed the importance of keeping the skin well lubricated.  I will be happy to see him back at any time if the ulcers fail to heal.   Deitra Mayo, MD, FACS Bee[place] [phone] Office: [phone]   HPI:   Micheal Walls is a pleasant 83 y.o. male, who noted and some ulceration on the anterior aspect of his left leg in early April.  He does not remember any specific injury to the leg but he has noted swelling in the left leg over the last several months.  He appears to  have chronic venous insufficiency.  He denies any previous history of DVT or phlebitis.  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  He had a stroke associated with left-sided weakness in 2002 also and for this reason he is nonambulatory.  Thus I do not get any history of claudication.  He denies any history of rest pain.  He does elevate his legs some and this does not make his pain worse.  He denies any fever or chills.  I have reviewed the records from the referring office.  The patient was seen on 09/17/2018 with a left lower extremity ulcer.  The patient has type 2 diabetes.  His diabetes has been under reasonably good control.  Patient also has hypertension which is a chronic problem.  The problem has not been under good control.  In addition the patient has mixed hyperlipidemia.  At the time of this visit the patient was noted to have 2 open wounds on the left leg and therefore sent for vascular consultation.  Past Medical History:  Diagnosis Date  . Amputated below knee ([place])    right  . Anemia   . Diastolic heart failure ([place])   . Diverticulosis   . DM (diabetes mellitus) ([place])   . Gout   . Hyperlipemia   . OSA on CPAP   . RBBB   .  Systemic hypertension     Family History  Problem Relation Age of Onset  . Diabetes Mother   . Heart attack Father   . Cancer Sister     SOCIAL HISTORY: Social History   Socioeconomic History  . Marital status: Married    Spouse name: Not on file  . Number of children: Not on file  . Years of education: Not on file  . Highest education level: Not on file  Occupational History  . Occupation: retired  Scientific laboratory technician  . Financial resource strain: Not hard at all  . Food insecurity:    Worry: Never true    Inability: Never true  . Transportation needs:    Medical: No    Non-medical: No  Tobacco Use  . Smoking status: Former Smoker    Types: Cigars    Last attempt to quit: 06/12/2001    Years  since quitting: 17.3  . Smokeless tobacco: Never Used  Substance and Sexual Activity  . Alcohol use: No    Alcohol/week: 0.0 standard drinks  . Drug use: No  . Sexual activity: Yes  Lifestyle  . Physical activity:    Days per week: 0 days    Minutes per session: 0 min  . Stress: Not at all  Relationships  . Social connections:    Talks on phone: Not on file    Gets together: Not on file    Attends religious service: Not on file    Active member of club or organization: Not on file    Attends meetings of clubs or organizations: Not on file    Relationship status: Not on file  . Intimate partner violence:    Fear of current or ex partner: Not on file    Emotionally abused: No    Physically abused: No    Forced sexual activity: No  Other Topics Concern  . Not on file  Social History Narrative  . Not on file    No Known Allergies  Current Outpatient Medications  Medication Sig Dispense Refill  . allopurinol (ZYLOPRIM) 100 MG tablet Take 1 tablet (100 mg total) by mouth daily. (Patient taking differently: Take 100 mg by mouth 2 (two) times daily. ) 90 tablet 1  . antiseptic oral rinse (BIOTENE) LIQD 15 mLs by Mouth Rinse route as needed for dry mouth.    Marland Kitchen aspirin 81 MG tablet Take 162 mg by mouth daily.    [name] Use a cotton tip applicator. Paint between toes of left foot as needed once daily. 1 Bottle 1  . cephALEXin (KEFLEX) 500 MG capsule Take 1 capsule (500 mg total) by mouth 2 (two) times daily. 14 capsule 0  . colchicine 0.6 MG tablet Take 0.6 mg by mouth as needed (gout flare up).     . Continuous Blood Gluc Receiver (FREESTYLE LIBRE READER) DEVI 1 each by Does not apply route 4 (four) times daily -  before meals and at bedtime. 1 Device 0  . Continuous Blood Gluc Sensor (FREESTYLE LIBRE 14 DAY SENSOR) MISC 1 each by Does not apply route every 14 (fourteen) days. 6 each 3  . diltiazem (CARDIZEM CD) 300 MG 24 hr capsule TAKE ONE CAPSULE  EACH DAY 90 capsule 1  . dorzolamide (TRUSOPT) 2 % ophthalmic solution Place 1 drop into both eyes 2 (two) times daily.    . ferrous sulfate 325 (65 FE) MG tablet Take 325 mg by mouth daily with breakfast.    .  fish oil-omega-3 fatty acids 1000 MG capsule Take 2 g by mouth daily.    . furosemide (LASIX) 40 MG tablet Take 1 tablet by mouth twice a day 180 tablet 0  . gabapentin (NEURONTIN) 100 MG capsule Take 1 capsule (100 mg total) by mouth 3 (three) times daily. 90 capsule 2  . hydrocortisone (ANUSOL-HC) 25 MG suppository Place 25 mg rectally 2 (two) times daily as needed for hemorrhoids or anal itching.    . Insulin Aspart (NOVOLOG [place]) Inject 42-48 Units into the skin 2 (two) times daily. 48 in the am and 42 at night.    . Insulin Detemir (LEVEMIR FLEXTOUCH) 100 UNIT/ML Pen Inject 20 Units into the skin at bedtime. 30 mL 1  . ketotifen (ALAWAY) 0.025 % ophthalmic solution Place 1 drop into both eyes 2 (two) times daily.    Elmore Guise Devices ([place] NEXT LANCING DEVICE) MISC by Does not apply route. Check blood sugar 3 times per day    . losartan (COZAAR) 100 MG tablet Take 1 tablet (100 mg total) by mouth daily. 90 tablet 1  . Multiple Vitamin (MULTIVITAMIN) tablet Take 1 tablet by mouth daily.    Marland Kitchen NOVOLOG FLEXPEN 100 UNIT/ML FlexPen Inject subcutaneously 42 units every morning and 42 units every evening (max dose) 5 pen 1  . pantoprazole (PROTONIX) 40 MG tablet Take 1 tablet (40 mg total) by mouth daily. 90 tablet 1  . Polyvinyl Alcohol-Povidone (REFRESH OP) Apply 1 drop to eye 2 (two) times daily.    . pramoxine-hydrocortisone (PRAMOSONE) cream Apply topically 3 (three) times daily. Apply topically to hemorrhoids four times daily as needed after bowel movements, for external use only    . PRESCRIPTION MEDICATION Place 1 drop into both eyes 2 (two) times daily. For glucoma    . rosuvastatin (CRESTOR) 10 MG tablet Take 1 tablet (10 mg total) by mouth daily. 90 tablet 1  . Semaglutide,0.25 or  0.$Rem'5MG'obwl$ /DOS, (OZEMPIC, 0.25 OR 0.5 MG/DOSE,) 2 MG/1.5ML SOPN Inject into the skin. Inject 0.$RemoveBefore'5mg'kowlyJrHAJtvh$  by subcutaneous route every week on the same day of each week in the abdomen,thighs or upper arm sites     No current facility-administered medications for this visit.     REVIEW OF SYSTEMS:  $RemoveB'[X]'uexltedQ$  denotes positive finding, $RemoveBeforeDEI'[ ]'PhzWwwQAFhKitoJp$  denotes negative finding Cardiac  Comments:  Chest pain or chest pressure:    Shortness of breath upon exertion:    Short of breath when lying flat:    Irregular heart rhythm:        Vascular    Pain in calf, thigh, or hip brought on by ambulation:    Pain in feet at night that wakes you up from your sleep:  x   Blood clot in your veins:    Leg swelling:  x       Pulmonary    Oxygen at home: x   Productive cough:     Wheezing:         Neurologic    Sudden weakness in arms or legs:     Sudden numbness in arms or legs:     Sudden onset of difficulty speaking or slurred speech:    Temporary loss of vision in one eye:  x   Problems with dizziness:  x       Gastrointestinal    Blood in stool:     Vomited blood:         Genitourinary    Burning when urinating:     Blood in urine:  Psychiatric    Major depression:         Hematologic    Bleeding problems:    Problems with blood clotting too easily:        Skin    Rashes or ulcers: x       Constitutional    Fever or chills:     PHYSICAL EXAM:   Vitals:   10/10/18 1238  BP: (!) 128/59  Pulse: 89  Resp: 20  Weight: 300 lb (136.1 kg)  Height: $Remove'6\' 1"'CCKRPrY$  (1.854 m)   Body mass index is 39.58 kg/m.  GENERAL: The patient is a well-nourished male, in no acute distress. The vital signs are documented above. CARDIAC: There is a regular rate and rhythm.  VASCULAR: I do not detect carotid bruits. He is in a wheelchair and was unable to get up on the bed but with some difficulty I was able to palpate femoral pulses. On the left side he has a monophasic posterior tibial signal with a biphasic dorsalis  pedis signal. He has moderate left lower extremity swelling with hyperpigmentation consistent with chronic venous insufficiency. PULMONARY: There is good air exchange bilaterally without wheezing or rales. ABDOMEN: Soft and non-tender with normal pitched bowel sounds.  MUSCULOSKELETAL: There are no major deformities or cyanosis. NEUROLOGIC: No focal weakness or paresthesias are detected. SKIN: He has an essentially healed venous ulcer on the anterior aspect of his left leg which measures about 5 mm in diameter.   PSYCHIATRIC: The patient has a normal affect.  DATA:    ARTERIAL DOPPLER STUDY: I have independently interpreted his arterial Doppler study today.  On the left side he has a monophasic posterior tibial signal with a biphasic dorsalis pedis signal.  The arteries are calcified and noncompressible thus an ABI could not be obtained.  However his toe pressure was 60 mmHg.  LABS: His GFR is 27.  Creatinine is 2.5.

## 2018-10-11 ENCOUNTER — Other Ambulatory Visit: Payer: Self-pay

## 2018-10-11 MED ORDER — SEMAGLUTIDE(0.25 OR 0.5MG/DOS) 2 MG/1.5ML ~~LOC~~ SOPN: 1.0000 mg | PEN_INJECTOR | SUBCUTANEOUS | 1 refills | Status: DC

## 2018-10-11 MED ORDER — SEMAGLUTIDE (1 MG/DOSE) 2 MG/1.5ML ~~LOC~~ SOPN: 1.0000 mg | PEN_INJECTOR | SUBCUTANEOUS | 1 refills | Status: DC

## 2018-10-11 MED ORDER — INSULIN DEGLUDEC 100 UNIT/ML ~~LOC~~ SOPN: 10.0000 [IU] | PEN_INJECTOR | Freq: Every day | SUBCUTANEOUS | 1 refills | Status: DC

## 2018-10-12 ENCOUNTER — Telehealth: Payer: Self-pay

## 2018-10-12 NOTE — Telephone Encounter (Signed)
Pharmacy called to see if we could change his rx for ozempic to a 90 day supply. Gave them the verbal ok. YRL,RMA

## 2018-10-15 ENCOUNTER — Encounter: Payer: Self-pay | Admitting: Podiatry

## 2018-10-15 NOTE — Progress Notes (Signed)
Subjective: Patient presents today for at risk foot care. Micheal Walls has h/o right BKA which requires periodic preventative treatment.  Micheal Brine, [name] is his PCP and last visit was 09/17/2018.   Current Outpatient Medications:  .  allopurinol (ZYLOPRIM) 100 MG tablet, Take 1 tablet (100 mg total) by mouth daily. (Patient taking differently: Take 100 mg by mouth 2 (two) times daily. ), Disp: 90 tablet, Rfl: 1 .  antiseptic oral rinse (BIOTENE) LIQD, 15 mLs by Mouth Rinse route as needed for dry mouth., Disp: , Rfl:  .  aspirin 81 MG tablet, Take 162 mg by mouth daily., Disp: , Rfl:  .  Castellani Paint Modified 1.5 % LIQD, Use a cotton tip applicator. Paint between toes of left foot as needed once daily., Disp: 1 Bottle, Rfl: 1 .  cephALEXin (KEFLEX) 500 MG capsule, Take 1 capsule (500 mg total) by mouth 2 (two) times daily., Disp: 14 capsule, Rfl: 0 .  colchicine 0.6 MG tablet, Take 0.6 mg by mouth as needed (gout flare up). , Disp: , Rfl:  .  Continuous Blood Gluc Receiver (FREESTYLE LIBRE READER) DEVI, 1 each by Does not apply route 4 (four) times daily -  before meals and at bedtime., Disp: 1 Device, Rfl: 0 .  Continuous Blood Gluc Sensor (FREESTYLE LIBRE 14 DAY SENSOR) MISC, 1 each by Does not apply route every 14 (fourteen) days., Disp: 6 each, Rfl: 3 .  diltiazem (CARDIZEM CD) 300 MG 24 hr capsule, TAKE ONE CAPSULE EACH DAY, Disp: 90 capsule, Rfl: 1 .  dorzolamide (TRUSOPT) 2 % ophthalmic solution, Place 1 drop into both eyes 2 (two) times daily., Disp: , Rfl:  .  ferrous sulfate 325 (65 FE) MG tablet, Take 325 mg by mouth daily with breakfast., Disp: , Rfl:  .  fish oil-omega-3 fatty acids 1000 MG capsule, Take 2 g by mouth daily., Disp: , Rfl:  .  furosemide (LASIX) 40 MG tablet, Take 1 tablet by mouth twice a day, Disp: 180 tablet, Rfl: 0 .  gabapentin (NEURONTIN) 100 MG capsule, Take 1 capsule (100 mg total) by mouth 3 (three) times daily., Disp: 90 capsule, Rfl: 2 .   hydrocortisone (ANUSOL-HC) 25 MG suppository, Place 25 mg rectally 2 (two) times daily as needed for hemorrhoids or anal itching., Disp: , Rfl:  .  Insulin Aspart (NOVOLOG [place]), Inject 42-48 Units into the skin 2 (two) times daily. 48 in the am and 42 at night., Disp: , Rfl:  .  Insulin Detemir (LEVEMIR FLEXTOUCH) 100 UNIT/ML Pen, Inject 20 Units into the skin at bedtime., Disp: 30 mL, Rfl: 1 .  ketotifen (ALAWAY) 0.025 % ophthalmic solution, Place 1 drop into both eyes 2 (two) times daily., Disp: , Rfl:  .  Lancet Devices ([place] NEXT LANCING DEVICE) MISC, by Does not apply route. Check blood sugar 3 times per day, Disp: , Rfl:  .  losartan (COZAAR) 100 MG tablet, Take 1 tablet (100 mg total) by mouth daily., Disp: 90 tablet, Rfl: 1 .  Multiple Vitamin (MULTIVITAMIN) tablet, Take 1 tablet by mouth daily., Disp: , Rfl:  .  NOVOLOG FLEXPEN 100 UNIT/ML FlexPen, Inject subcutaneously 42 units every morning and 42 units every evening (max dose), Disp: 5 pen, Rfl: 1 .  pantoprazole (PROTONIX) 40 MG tablet, Take 1 tablet (40 mg total) by mouth daily., Disp: 90 tablet, Rfl: 1 .  Polyvinyl Alcohol-Povidone (REFRESH OP), Apply 1 drop to eye 2 (two) times daily., Disp: , Rfl:  .  pramoxine-hydrocortisone (PRAMOSONE)  cream, Apply topically 3 (three) times daily. Apply topically to hemorrhoids four times daily as needed after bowel movements, for external use only, Disp: , Rfl:  .  PRESCRIPTION MEDICATION, Place 1 drop into both eyes 2 (two) times daily. For glucoma, Disp: , Rfl:  .  rosuvastatin (CRESTOR) 10 MG tablet, Take 1 tablet (10 mg total) by mouth daily., Disp: 90 tablet, Rfl: 1 .  insulin degludec (TRESIBA FLEXTOUCH) 100 UNIT/ML SOPN FlexTouch Pen, Inject 0.1 mLs (10 Units total) into the skin daily., Disp: 15 pen, Rfl: 1 .  Semaglutide, [name], (OZEMPIC, [name],) 2 MG/1.5ML SOPN, Inject 1 mg into the skin once a week., Disp: 6 pen, Rfl: 1   No Known Allergies   Objective: Oral temperature  97.5 F Vascular Examination: Capillary refill time delay x 5 digit left foot.   Dorsalis pedis pulse nonpalpable left lower extremity.  Posterior tibial pulses nonpalpable left lower extremity.  Digital hair absent x5 digits left foot.  Skin temperature gradient warm to cool left lower extremity.  Dermatological Examination: Skin thin, shiny and atrophic left lower extremity.  Toenails 1-5 left foot discolored, thick, dystrophic with subungual debris and pain with palpation to nailbeds due to thickness of nails.  No interdigital macerations left foot.  No open wounds noted left foot.  Musculoskeletal: Muscle strength 5/5 to all LE muscle groups left lower extremity.  Below-knee amputation right lower extremity.  Patient uses motorized chair for mobility.  He is still able to transfer on his own.  Neurological: Sensation diminished with 10 gram monofilament left lower extremity.  Assessment: 1. Painful onychomycosis toenails 1-5 left foot  2. NIDDM with Peripheral arterial disease 3. Status post right below-knee amputation  Plan: 1. Toenails 1-5 left foot were debrided in length and girth without iatrogenic bleeding. 2. Interdigital tinea pedis is resolved left foot.  I did refill his modified Castellani paint and he is to use it as needed should the macerations reappear. 3. Patient to continue soft, supportive shoe gear daily. 4. Patient to report any pedal injuries to medical professional immediately. 5. Follow up 3 months. 6. Patient/POA to call should there be a concern in the interim.

## 2018-10-23 ENCOUNTER — Telehealth: Payer: Self-pay

## 2018-10-23 ENCOUNTER — Other Ambulatory Visit: Payer: Self-pay

## 2018-10-23 MED ORDER — PEN NEEDLES 32G X 4 MM MISC: 1.0000 | Freq: Two times a day (BID) | 2 refills | Status: DC

## 2018-10-23 NOTE — Telephone Encounter (Signed)
I called pt to see if he would like to do Cardiovascular generic screening and pt stated no he is being followed by his a heart doctor and he is doing well. YRL,RMA

## 2018-10-24 ENCOUNTER — Other Ambulatory Visit: Payer: Self-pay

## 2018-10-24 MED ORDER — INSULIN PEN NEEDLE 32G X 6 MM MISC: 1 refills | Status: DC

## 2018-10-24 NOTE — Telephone Encounter (Signed)
Okay; thanks.

## 2018-10-29 DIAGNOSIS — H04123 Dry eye syndrome of bilateral lacrimal glands: Secondary | ICD-10-CM | POA: Diagnosis not present

## 2018-10-29 DIAGNOSIS — H0102B Squamous blepharitis left eye, upper and lower eyelids: Secondary | ICD-10-CM | POA: Diagnosis not present

## 2018-10-29 DIAGNOSIS — H401132 Primary open-angle glaucoma, bilateral, moderate stage: Secondary | ICD-10-CM | POA: Diagnosis not present

## 2018-10-29 DIAGNOSIS — H0102A Squamous blepharitis right eye, upper and lower eyelids: Secondary | ICD-10-CM | POA: Diagnosis not present

## 2018-10-31 ENCOUNTER — Other Ambulatory Visit: Payer: Self-pay | Admitting: Nurse Practitioner

## 2018-11-14 ENCOUNTER — Other Ambulatory Visit: Payer: Self-pay | Admitting: Nurse Practitioner

## 2018-11-20 ENCOUNTER — Telehealth: Payer: Self-pay | Admitting: *Deleted

## 2018-11-20 NOTE — Telephone Encounter (Signed)
Received request from [place] [facility] for insulin needles prescription clarification. Faxed form back to pharmacy with signed note from Dr. Jaynee Eagles to have pt contact PCP. Received a receipt of confirmation.  Dr. Jaynee Eagles does not prescribe medications for this patient.

## 2018-11-26 ENCOUNTER — Encounter: Payer: Self-pay | Admitting: Nurse Practitioner

## 2018-11-26 DIAGNOSIS — H35372 Puckering of macula, left eye: Secondary | ICD-10-CM | POA: Diagnosis not present

## 2018-11-26 DIAGNOSIS — H43813 Vitreous degeneration, bilateral: Secondary | ICD-10-CM | POA: Diagnosis not present

## 2018-11-26 DIAGNOSIS — H353132 Nonexudative age-related macular degeneration, bilateral, intermediate dry stage: Secondary | ICD-10-CM | POA: Diagnosis not present

## 2018-11-26 DIAGNOSIS — E113593 Type 2 diabetes mellitus with proliferative diabetic retinopathy without macular edema, bilateral: Secondary | ICD-10-CM | POA: Diagnosis not present

## 2018-11-26 LAB — HM DIABETES EYE EXAM

## 2018-12-13 ENCOUNTER — Encounter: Payer: Self-pay | Admitting: Cardiovascular Disease

## 2018-12-13 ENCOUNTER — Other Ambulatory Visit: Payer: Self-pay

## 2018-12-13 ENCOUNTER — Ambulatory Visit (INDEPENDENT_AMBULATORY_CARE_PROVIDER_SITE_OTHER): Payer: Medicare Other | Admitting: Cardiovascular Disease

## 2018-12-13 VITALS — BP 145/74 | HR 87 | Temp 97.0°F | Wt 290.4 lb

## 2018-12-13 DIAGNOSIS — R600 Localized edema: Secondary | ICD-10-CM | POA: Diagnosis not present

## 2018-12-13 DIAGNOSIS — E782 Mixed hyperlipidemia: Secondary | ICD-10-CM | POA: Diagnosis not present

## 2018-12-13 DIAGNOSIS — N184 Chronic kidney disease, stage 4 (severe): Secondary | ICD-10-CM

## 2018-12-13 DIAGNOSIS — Z9989 Dependence on other enabling machines and devices: Secondary | ICD-10-CM | POA: Diagnosis not present

## 2018-12-13 DIAGNOSIS — I739 Peripheral vascular disease, unspecified: Secondary | ICD-10-CM | POA: Diagnosis not present

## 2018-12-13 DIAGNOSIS — G4733 Obstructive sleep apnea (adult) (pediatric): Secondary | ICD-10-CM | POA: Diagnosis not present

## 2018-12-13 DIAGNOSIS — I10 Essential (primary) hypertension: Secondary | ICD-10-CM

## 2018-12-13 DIAGNOSIS — Z89511 Acquired absence of right leg below knee: Secondary | ICD-10-CM

## 2018-12-13 NOTE — Progress Notes (Signed)
Cardiology Office Note    Date:  12/15/2018   ID:  KIVON APREA, DOB 1936-03-16, MRN [id number]  PCP:  [name], [name]  Cardiologist:  Shelva Majestic, MD (sleep); Cr. Croituru  No chief complaint on file.  F/U sleep evaluation  History of Present Illness:  SYLVANUS TELFORD is a 83 y.o. male who presents to the office today for a 2-monthfollow-up sleep evaluation.  Mr. HCarpenterhas a history of hypertension, diastolic heart failure, diabetes mellitus, and hyperlipidemia.  In 2011 he was referred for a sleep study due to concerns for obstructive sleep apnea.  At that time, he had loud snoring, witnessed apnea, gasping for breath, and nonrestorative sleep.  A diagnostic polysomnogram done on 09/22/2009 showed moderate obstructive sleep apnea overall with an AHI of 23.46 per hour.  Sleep apnea was severe during rim sleep at 57.39 per hour.  He had significant oxygen desaturation to 82% with non-REM sleep and 50% with rems sleep.  There was moderate snoring.  He underwent CPAP titration and was titrated up to 11 7 m water pressure on 10/19/2009.  He has been utilizing CPAP therapy ever since and admits to 100% compliance with use.  In 2018 his machine  started to intermittently malfunction.  Apparently, on 112/02/2017 the machine completely died and is no longer turns on for CPAP therapy.  His DME company: [facility] [employer].  His old machine was arrest Brontex CPAP unit.  His start date was 12/02/2009.  He goes to bed at 11 PM and wakes up at 9 AM.  He has 2-3 episodes of nocturia per night.  He is added onto my schedule in December 2018 since his machine was no longer functional and he was  in need for a new CPAP machine as soon as possible.   When I saw him in December 2018 an Epworth Sleepiness Scale score was was elevated at 14, consistent with daytime sleepiness, particularly since his machine has been nonfunctional. He denied any chest pain, PND orthopnea.  He is  status post right BKA.   He has a history of chronic renal insufficiency.  He received a new ResMed air sense 10 AutoSet CPAP unit on June 02, 2017.  [facility] is his DME company.  Since receiving the new machine, he resumed CPAP therapy with 100% compliance.  I saw him for follow-up evaluation in March 2019.  A download was obtained in the office  from August 07, 2017 through September 05, 2017.  Usage 100% as was usage greater than 4 hours; averaging 7 hours and 23 minutes of CPAP use per night.  AHI  4.  His minimum pressure is set at 8 with a maximum of 20.  Review of his download indicates that he is having significant mask leak which may also be increasing his AHI.  He had been using very old nasal pillows.   I have not seen him since March 2019 although he was seen by HMammie Russian in February 2020.  He is wheelchair-bound.  He has a past history of CVA, right BKA with diabetes related peripheral vascular disease, hypertension, hyperlipidemia, and is chronic kidney disease followed by Dr. GMoshe Cipro  Presently, he denies any chest pain.  He continues to be compliant CPAP therapy.  [employer] is now his new DME company who recently purchased [facility].  He typically goes to bed between midnight and 1 AM and wakes up between 10 and 11 AM.  A download from  June 1 through December 11, 2018 was obtained.  Compliance was 93% of usage days the 2 days he did not use treatment was because he had significant nasal irritation from his nasal pillows.  Apparently he has been on a minimum pressure ranging from 8 to a maximum pressure of 20.  95th percentile pressure is 12 with a maximum average pressure of 13.  AHI is 7.4 with an apnea index of 5.5.  He continues to be sleepy during the day.  An Epworth scale was recalculated and this endorsed to 23.  He sees Dr. Orene Desanctis for his cardiology care.  He denies any anginal symptoms.  Last creatinine was 2.5 in April 2020.  He presents for evaluation.  Past Medical History:   Diagnosis Date  . Amputated below knee ([place])    right  . Anemia   . Diastolic heart failure ([place])   . Diverticulosis   . DM (diabetes mellitus) ([place])   . Gout   . Hyperlipemia   . OSA on CPAP   . RBBB   . Systemic hypertension     Past Surgical History:  Procedure Laterality Date  . LEG AMPUTATION BELOW KNEE     right  . NM MYOCAR PERF WALL MOTION  09/18/2009   no ischemia    Current Medications: Outpatient Medications Prior to Visit  Medication Sig Dispense Refill  . allopurinol (ZYLOPRIM) 100 MG tablet Take 1 tablet (100 mg total) by mouth daily. (Patient taking differently: Take 100 mg by mouth 2 (two) times daily. ) 90 tablet 1  . antiseptic oral rinse (BIOTENE) LIQD 15 mLs by Mouth Rinse route as needed for dry mouth.    Marland Kitchen aspirin 81 MG tablet Take 162 mg by mouth daily.    [name] Use a cotton tip applicator. Paint between toes of left foot as needed once daily. 1 Bottle 1  . cephALEXin (KEFLEX) 500 MG capsule Take 1 capsule (500 mg total) by mouth 2 (two) times daily. 14 capsule 0  . colchicine 0.6 MG tablet Take 0.6 mg by mouth as needed (gout flare up).     . Continuous Blood Gluc Receiver (FREESTYLE LIBRE READER) DEVI 1 each by Does not apply route 4 (four) times daily -  before meals and at bedtime. 1 Device 0  . Continuous Blood Gluc Sensor (FREESTYLE LIBRE 14 DAY SENSOR) MISC 1 each by Does not apply route every 14 (fourteen) days. 6 each 3  . diltiazem (CARDIZEM CD) 300 MG 24 hr capsule TAKE ONE CAPSULE EACH DAY 90 capsule 1  . dorzolamide (TRUSOPT) 2 % ophthalmic solution Place 1 drop into both eyes 2 (two) times daily.    . ferrous sulfate 325 (65 FE) MG tablet Take 325 mg by mouth daily with breakfast.    . fish oil-omega-3 fatty acids 1000 MG capsule Take 2 g by mouth daily.    . furosemide (LASIX) 40 MG tablet Take 1 tablet by mouth twice a day 180 tablet 0  . gabapentin (NEURONTIN) 100 MG capsule Take 1 capsule (100 mg total) by  mouth 3 (three) times daily. 90 capsule 2  . hydrocortisone (ANUSOL-HC) 25 MG suppository Place 25 mg rectally 2 (two) times daily as needed for hemorrhoids or anal itching.    . insulin degludec (TRESIBA FLEXTOUCH) 100 UNIT/ML SOPN FlexTouch Pen Inject 0.1 mLs (10 Units total) into the skin daily. 15 pen 1  . Insulin Pen Needle (NOVOFINE) 32G X 6 MM MISC ADMINISTER  INSULIN 3 TIMES DAILY BEFORE MEALS AND AT BEDTIME AS DIRECTED 300 each 1  . ketotifen (ALAWAY) 0.025 % ophthalmic solution Place 1 drop into both eyes 2 (two) times daily.    Elmore Guise Devices ([place] NEXT LANCING DEVICE) MISC by Does not apply route. Check blood sugar 3 times per day    . losartan (COZAAR) 100 MG tablet Take 1 tablet (100 mg total) by mouth daily. 90 tablet 1  . Multiple Vitamin (MULTIVITAMIN) tablet Take 1 tablet by mouth daily.    . niacin (NIASPAN) 500 MG CR tablet TAKE 2 TABLETS AT BEDTIME 180 tablet 1  . NOVOLOG FLEXPEN 100 UNIT/ML FlexPen Inject subcutaneously 42 units every morning and 42 units every evening (max dose) 5 pen 1  . pantoprazole (PROTONIX) 40 MG tablet Take 1 tablet (40 mg total) by mouth daily. 90 tablet 1  . Polyvinyl Alcohol-Povidone (REFRESH OP) Apply 1 drop to eye 2 (two) times daily.    . pramoxine-hydrocortisone (PRAMOSONE) cream Apply topically 3 (three) times daily. Apply topically to hemorrhoids four times daily as needed after bowel movements, for external use only    . PRESCRIPTION MEDICATION Place 1 drop into both eyes 2 (two) times daily. For glucoma    . rosuvastatin (CRESTOR) 10 MG tablet Take 1 tablet (10 mg total) by mouth daily. 90 tablet 1  . Semaglutide, [name], (OZEMPIC, [name],) 2 MG/1.5ML SOPN Inject 1 mg into the skin once a week. 6 pen 1  . Insulin Aspart (NOVOLOG [place]) Inject 42-48 Units into the skin 2 (two) times daily. 48 in the am and 42 at night.    . Insulin Detemir (LEVEMIR FLEXTOUCH) 100 UNIT/ML Pen Inject 20 Units into the skin at bedtime. (Patient not  taking: Reported on 12/13/2018) 30 mL 1   No facility-administered medications prior to visit.      Allergies:   Patient has no known allergies.   Social History   Socioeconomic History  . Marital status: Married    Spouse name: Not on file  . Number of children: Not on file  . Years of education: Not on file  . Highest education level: Not on file  Occupational History  . Occupation: retired  Scientific laboratory technician  . Financial resource strain: Not hard at all  . Food insecurity    Worry: Never true    Inability: Never true  . Transportation needs    Medical: No    Non-medical: No  Tobacco Use  . Smoking status: Former Smoker    Types: Cigars    Quit date: 06/12/2001    Years since quitting: 17.5  . Smokeless tobacco: Never Used  Substance and Sexual Activity  . Alcohol use: No    Alcohol/week: 0.0 standard drinks  . Drug use: No  . Sexual activity: Yes  Lifestyle  . Physical activity    Days per week: 0 days    Minutes per session: 0 min  . Stress: Not at all  Relationships  . Social Herbalist on phone: Not on file    Gets together: Not on file    Attends religious service: Not on file    Active member of club or organization: Not on file    Attends meetings of clubs or organizations: Not on file    Relationship status: Not on file  Other Topics Concern  . Not on file  Social History Narrative  . Not on file     Family History:  The  patient's family history includes Cancer in his sister; Diabetes in his mother; Heart attack in his father.   ROS General: Negative; No fevers, chills, or night sweats;  HEENT: Negative; No changes in vision or hearing, sinus congestion, difficulty swallowing Pulmonary: Negative; No cough, wheezing, shortness of breath, hemoptysis Cardiovascular: Negative; No chest pain, presyncope, syncope, palpitations GI: Negative; No nausea, vomiting, diarrhea, or abdominal pain GU: Negative; No dysuria, hematuria, or difficulty voiding  Musculoskeletal: Positive for right below the knee amputation Hematologic/Oncology: Negative; no easy bruising, bleeding Endocrine: Positive for diabetes mellitus Neuro: Negative; no changes in balance, headaches Skin: Negative; No rashes or skin lesions Psychiatric: Negative; No behavioral problems, depression Sleep: Positive for OSA, snoring, daytime sleepiness, hypersomnolence; No bruxism, restless legs, hypnogognic hallucinations, no cataplexy Other comprehensive 14 point system review is negative.   PHYSICAL EXAM:   VS:  BP (!) 145/74   Pulse 87   Temp (!) 97 F (36.1 C)   Wt 290 lb 6.4 oz (131.7 kg)   SpO2 99%   BMI 38.31 kg/m     Repeat blood pressure by me 138/76  Wt Readings from Last 3 Encounters:  12/13/18 290 lb 6.4 oz (131.7 kg)  10/10/18 300 lb (136.1 kg)  07/19/18 300 lb (136.1 kg)   General: Alert, oriented, no distress.  Skin: normal turgor, no rashes, warm and dry HEENT: Normocephalic, atraumatic. Pupils equal round and reactive to light; sclera anicteric; extraocular muscles intact;  Nose without nasal septal hypertrophy Mouth/Parynx benign; Mallinpatti scale previously noted to be 4 Neck: No JVD, no carotid bruits; normal carotid upstroke Lungs: clear to ausculatation and percussion; no wheezing or rales Chest wall: without tenderness to palpitation Heart: PMI not displaced, RRR, s1 s2 normal, 1/6 systolic murmur, no diastolic murmur, no rubs, gallops, thrills, or heaves Abdomen: soft, nontender; no hepatosplenomehaly, BS+; abdominal aorta nontender and not dilated by palpation. Back: no CVA tenderness Pulses 2+ Musculoskeletal: Right BKA Extremities: no clubbing cyanosis; 1-2+ left ankle edema, Homan's sign negative  Neurologic: grossly nonfocal; Cranial nerves grossly wnl Psychologic: Normal mood and affect   Studies/Labs Reviewed:   ECG (independently read by me): Normal sinus rhythm 85 bpm, first-degree AV block with a PR interval 236 ms.  Right  bundle branch block with repolarization changes.  Isolated PVC.  September 07, 2017 ECG (independently read by me): Normal sinus rhythm at 73 bpm.  Right bundle branch block with repolarization changes.  First-degree AV block with a PR interval at 276 ms.  Recent Labs: BMP Latest Ref Rng & Units 09/17/2018 09/12/2018 06/18/2018  Glucose 65 - 99 mg/dL 135(H) 126(H) 93  BUN 8 - 27 mg/dL 60(H) 64(H) 61(H)  Creatinine 0.76 - 1.27 mg/dL 2.51(H) 2.71(H) 2.10(H)  BUN/Creat Ratio 10 - 24 24 - 29(H)  Sodium 134 - 144 mmol/L 139 139 139  Potassium 3.5 - 5.2 mmol/L 4.8 4.3 4.5  Chloride 96 - 106 mmol/L 103 106 105  CO2 20 - 29 mmol/L 22 22 22   Calcium 8.6 - 10.2 mg/dL 10.2 10.1 10.2     Hepatic Function Latest Ref Rng & Units 09/17/2018 06/18/2018 03/15/2018  Total Protein 6.0 - 8.5 g/dL 6.7 7.1 6.7  Albumin 3.7 - 4.7 g/dL 3.9 4.1 3.7  AST 0 - 40 IU/L 24 26 23   ALT 0 - 44 IU/L 20 23 23   Alk Phosphatase 39 - 117 IU/L 71 72 63  Total Bilirubin 0.0 - 1.2 mg/dL 0.5 0.5 0.4  Bilirubin, Direct 0.00 - 0.40 mg/dL - - 0.17  CBC Latest Ref Rng & Units 09/12/2018 03/15/2018 02/27/2018  WBC 4.0 - 10.5 K/uL 6.0 4.8 6.1  Hemoglobin 13.0 - 17.0 g/dL 11.4(L) 10.6(L) -  Hematocrit 39.0 - 52.0 % 35.8(L) 32.5(L) -  Platelets 150 - 400 K/uL 150 139(L) 156   Lab Results  Component Value Date   MCV 96.0 09/12/2018   MCV 90 03/15/2018   MCV 83.4 01/14/2011   Lab Results  Component Value Date   TSH 4.310 09/17/2018   Lab Results  Component Value Date   HGBA1C 6.0 (H) 09/17/2018     BNP No results found for: BNP  ProBNP    Component Value Date/Time   PROBNP 197.2 (H) 01/09/2011 0745     Lipid Panel     Component Value Date/Time   CHOL 84 (L) 09/17/2018 0949   TRIG 57 09/17/2018 0949   HDL 36 (L) 09/17/2018 0949   CHOLHDL 2.3 09/17/2018 0949   LDLCALC 37 09/17/2018 0949     RADIOLOGY: No results found.   Additional studies/ records that were reviewed today include:  I reviewed the patient's  diagnostic polysomnogram and CPAP titration trials from 2011.  I reviewed office records from Dr. Sallyanne Kuster.    Download was obtained from November 12, 2018 through December 11, 2018  ASSESSMENT:    1. OSA on CPAP   2. Essential hypertension   3. PAD (peripheral artery disease) ([place])   4. Hx of right BKA ([place])   5. Chronic kidney disease (CKD), stage IV (severe) ([place])   6. Morbid obesity ([facility])   7. Leg edema   8. Mixed hyperlipidemia     PLAN:  Mr. Ella Guillotte is a 83 year old African-American male who has significant cardiovascular comorbidities including systemic hypertension, diastolic heart failure, diabetes mellitus, hyperlipidemia, and is status post right BKA amputation.  He is been documented to have obstructive sleep apnea since his initial diagnostic polysomnogram in April 2011.  He has been on CPAP therapy since his set up date on 12/02/2009.  On his initial evaluation, his overall AHI was 23.46/h but during REM sleep he had severe sleep apnea with an AHI of 57.39/h.  He had significant oxygen desaturation to a nadir of 50% during REM sleep.  After his initial machine had malfunctioned, he received a new ResMed air sense 10 AutoSet CPAP unit with set up date June 02, 2017.  He has noticed marked improvement with his new machine compared to his old one.  He continues to feel significantly improved since initiating therapy.  He has been using nasal pillows but sometimes this creates significant irritation to his nares and on 2 days over the past month he was unable to use therapy.  His most recent download shows an AHI of 7.4 with his minimum pressure set at 8 with maximum up to 20.  95th percentile pressure was 12.0 with a maximum average pressure of 13.0.  I am changing his settings to increase his minimal pressure set up at 10 and changes maximum up to 18.  I am changing his mask to a ResMed air fit N 30i which should not cause nasal irritation and the tubing will come from the crown of  his head.  His blood pressure based on new hypertensive guidelines was elevated.  He has continued to be on furosemide 40 mg twice a day, diltiazem 300 mg daily and losartan 100 mg daily for blood pressure control.  He is on rosuvastatin for hyperlipidemia and is tolerating this.  He is diabetic  on insulin.  He has a history of gout on allopurinol but is not had recent episodes.  He will follow-up with Dr. Sallyanne Kuster for his cardiology care with potential medication adjustment if necessary.  I will obtain a download in 2 to 3 months in follow-up of these CPAP changes.  I will see him in 1 year per Medicare requirements or sooner if necessary.   Evaluate medication Adjustments/Labs and Tests Ordered: Current medicines are reviewed at length with the patient today.  Concerns regarding medicines are outlined above.  Medication changes, Labs and Tests ordered today are listed in the Patient Instructions below. Patient Instructions  Medication Instructions:  The current medical regimen is effective;  continue present plan and medications.  If you need a refill on your cardiac medications before your next appointment, please call your pharmacy.   Follow-Up: At [facility], you and your health needs are our priority.  As part of our continuing mission to provide you with exceptional heart care, we have created designated Provider Care Teams.  These Care Teams include your primary Cardiologist (physician) and Advanced Practice Providers (APPs -  Physician Assistants and Nurse Practitioners) who all work together to provide you with the care you need, when you need it. You will need a follow up appointment in 12 months.  Please call our office 2 months in advance to schedule this appointment.  You may see Dr.Aza Dantes (sleep) or one of the following Advanced Practice Providers on your designated Care Team: Almyra Deforest, [place] . Fabian Sharp, PA-C       Signed, Shelva Majestic, MD  12/15/2018 1:27 PM    [facility] [address], [place], [place], [place]  [address] Phone: [phone]

## 2018-12-13 NOTE — Patient Instructions (Signed)
Medication Instructions:  The current medical regimen is effective;  continue present plan and medications.  If you need a refill on your cardiac medications before your next appointment, please call your pharmacy.   Follow-Up: At [facility], you and your health needs are our priority.  As part of our continuing mission to provide you with exceptional heart care, we have created designated Provider Care Teams.  These Care Teams include your primary Cardiologist (physician) and Advanced Practice Providers (APPs -  Physician Assistants and Nurse Practitioners) who all work together to provide you with the care you need, when you need it. You will need a follow up appointment in 12 months.  Please call our office 2 months in advance to schedule this appointment.  You may see Dr.Kelly (sleep) or one of the following Advanced Practice Providers on your designated Care Team: Hao Meng, PA-C . Angela Duke, PA-C    

## 2018-12-15 ENCOUNTER — Encounter: Payer: Self-pay | Admitting: Cardiovascular Disease

## 2018-12-17 ENCOUNTER — Encounter: Payer: Self-pay | Admitting: Nurse Practitioner

## 2018-12-17 ENCOUNTER — Ambulatory Visit: Payer: Medicare Other | Admitting: Nurse Practitioner

## 2018-12-17 ENCOUNTER — Other Ambulatory Visit: Payer: Self-pay

## 2018-12-17 ENCOUNTER — Ambulatory Visit (INDEPENDENT_AMBULATORY_CARE_PROVIDER_SITE_OTHER): Payer: Medicare Other | Admitting: Nurse Practitioner

## 2018-12-17 VITALS — BP 122/74 | HR 81 | Temp 98.2°F

## 2018-12-17 DIAGNOSIS — N183 Chronic kidney disease, stage 3 unspecified: Secondary | ICD-10-CM

## 2018-12-17 DIAGNOSIS — Z794 Long term (current) use of insulin: Secondary | ICD-10-CM

## 2018-12-17 DIAGNOSIS — I739 Peripheral vascular disease, unspecified: Secondary | ICD-10-CM | POA: Diagnosis not present

## 2018-12-17 DIAGNOSIS — I10 Essential (primary) hypertension: Secondary | ICD-10-CM | POA: Diagnosis not present

## 2018-12-17 DIAGNOSIS — I129 Hypertensive chronic kidney disease with stage 1 through stage 4 chronic kidney disease, or unspecified chronic kidney disease: Secondary | ICD-10-CM

## 2018-12-17 DIAGNOSIS — G629 Polyneuropathy, unspecified: Secondary | ICD-10-CM

## 2018-12-17 DIAGNOSIS — Z89611 Acquired absence of right leg above knee: Secondary | ICD-10-CM

## 2018-12-17 DIAGNOSIS — E782 Mixed hyperlipidemia: Secondary | ICD-10-CM | POA: Diagnosis not present

## 2018-12-17 DIAGNOSIS — E1159 Type 2 diabetes mellitus with other circulatory complications: Secondary | ICD-10-CM

## 2018-12-17 MED ORDER — PANTOPRAZOLE SODIUM 40 MG PO TBEC: 40.0000 mg | DELAYED_RELEASE_TABLET | Freq: Every day | ORAL | 1 refills | Status: DC

## 2018-12-17 MED ORDER — LOSARTAN POTASSIUM 100 MG PO TABS: 100.0000 mg | ORAL_TABLET | Freq: Every day | ORAL | 1 refills | Status: DC

## 2018-12-17 MED ORDER — ROSUVASTATIN CALCIUM 10 MG PO TABS: 10.0000 mg | ORAL_TABLET | Freq: Every day | ORAL | 1 refills | Status: DC

## 2018-12-17 MED ORDER — GABAPENTIN 100 MG PO CAPS: 100.0000 mg | ORAL_CAPSULE | Freq: Three times a day (TID) | ORAL | 0 refills | Status: DC

## 2018-12-17 NOTE — Progress Notes (Signed)
Subjective:     Patient ID: Micheal Walls , male    DOB: [birth year] , 83 y.o.   MRN: [id number]   Chief Complaint  Patient presents with  . Diabetes    HPI  He feels like he has lost about 4 lbs.   Diabetes He presents for his follow-up diabetic visit. He has type 2 diabetes mellitus. His disease course has been improving. Pertinent negatives for hypoglycemia include no confusion, dizziness, headaches or nervousness/anxiousness. Pertinent negatives for diabetes include no blurred vision, no fatigue, no polydipsia, no polyphagia and no polyuria. There are no hypoglycemic complications. Symptoms are improving. Current diabetic treatment includes oral agent (dual therapy) and insulin injections (he is using Elenor Legato it has fallen off early). He is compliant with treatment most of the time. He is following a diabetic diet. He rarely participates in exercise. His home blood glucose trend is decreasing steadily. (Blood sugar ranging 66 - 130 average.  He did have some lows below 70 with some shakiness.  Continues to take ozempic $RemoveBefo'1mg'mtmmxASyfVF$  and 10 units of Tresiba.  ) An ACE inhibitor/angiotensin II receptor blocker is being taken. He does not see a podiatrist.Eye exam is current (Dr. Radene Ou - March 2020).  Hypertension This is a chronic problem. The current episode started more than 1 year ago. The problem is unchanged. The problem is uncontrolled. Associated symptoms include peripheral edema (left lower extremity). Pertinent negatives include no anxiety, blurred vision or headaches. There are no associated agents to hypertension. Risk factors for coronary artery disease include obesity and sedentary lifestyle. Past treatments include ACE inhibitors. There are no compliance problems.  Hypertensive end-organ damage includes kidney disease. There is no history of angina. Identifiable causes of hypertension include chronic renal disease.     Past Medical History:  Diagnosis Date  . 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 Anemia   . Diastolic heart failure ([place])   . Diverticulosis   . DM (diabetes mellitus) ([place])   . Gout   . Hyperlipemia   . OSA on CPAP   . RBBB   . Systemic hypertension      Family History  Problem Relation Age of Onset  . Diabetes Mother   . Heart attack Father   . Cancer Sister      Current Outpatient Medications:  .  allopurinol (ZYLOPRIM) 100 MG tablet, Take 1 tablet (100 mg total) by mouth daily. (Patient taking differently: Take 100 mg by mouth 2 (two) times daily. ), Disp: 90 tablet, Rfl: 1 .  antiseptic oral rinse (BIOTENE) LIQD, 15 mLs by Mouth Rinse route as needed for dry mouth., Disp: , Rfl:  .  aspirin 81 MG tablet, Take 162 mg by mouth daily., Disp: , Rfl:  .  Castellani Paint Modified 1.5 % LIQD, Use a cotton tip applicator. Paint between toes of left foot as needed once daily., Disp: 1 Bottle, Rfl: 1 .  cephALEXin (KEFLEX) 500 MG capsule, Take 1 capsule (500 mg total) by mouth 2 (two) times daily., Disp: 14 capsule, Rfl: 0 .  colchicine 0.6 MG tablet, Take 0.6 mg by mouth as needed (gout flare up). , Disp: , Rfl:  .  Continuous Blood Gluc Receiver (FREESTYLE LIBRE READER) DEVI, 1 each by Does not apply route 4 (four) times daily -  before meals and at bedtime., Disp: 1 Device, Rfl: 0 .  Continuous Blood Gluc Sensor (FREESTYLE LIBRE 14 DAY SENSOR) MISC, 1 each by Does not apply route every 14 (  fourteen) days., Disp: 6 each, Rfl: 3 .  diltiazem (CARDIZEM CD) 300 MG 24 hr capsule, TAKE ONE CAPSULE EACH DAY, Disp: 90 capsule, Rfl: 1 .  dorzolamide (TRUSOPT) 2 % ophthalmic solution, Place 1 drop into both eyes 2 (two) times daily., Disp: , Rfl:  .  ferrous sulfate 325 (65 FE) MG tablet, Take 325 mg by mouth daily with breakfast., Disp: , Rfl:  .  fish oil-omega-3 fatty acids 1000 MG capsule, Take 2 g by mouth daily., Disp: , Rfl:  .  furosemide (LASIX) 40 MG tablet, Take 1 tablet by mouth twice a day, Disp: 180 tablet, Rfl: 0 .  hydrocortisone (ANUSOL-HC) 25  MG suppository, Place 25 mg rectally 2 (two) times daily as needed for hemorrhoids or anal itching., Disp: , Rfl:  .  insulin degludec (TRESIBA FLEXTOUCH) 100 UNIT/ML SOPN FlexTouch Pen, Inject 0.1 mLs (10 Units total) into the skin daily., Disp: 15 pen, Rfl: 1 .  ketotifen (ALAWAY) 0.025 % ophthalmic solution, Place 1 drop into both eyes 2 (two) times daily., Disp: , Rfl:  .  Lancet Devices (MICROLET NEXT LANCING DEVICE) MISC, by Does not apply route. Check blood sugar 3 times per day, Disp: , Rfl:  .  Multiple Vitamin (MULTIVITAMIN) tablet, Take 1 tablet by mouth daily., Disp: , Rfl:  .  NOVOLOG FLEXPEN 100 UNIT/ML FlexPen, Inject subcutaneously 42 units every morning and 42 units every evening (max dose), Disp: 5 pen, Rfl: 1 .  Polyvinyl Alcohol-Povidone (REFRESH OP), Apply 1 drop to eye 2 (two) times daily., Disp: , Rfl:  .  pramoxine-hydrocortisone (PRAMOSONE) cream, Apply topically 3 (three) times daily. Apply topically to hemorrhoids four times daily as needed after bowel movements, for external use only, Disp: , Rfl:  .  PRESCRIPTION MEDICATION, Place 1 drop into both eyes 2 (two) times daily. For glucoma, Disp: , Rfl:  .  Semaglutide, [name], (OZEMPIC, [name],) 2 MG/1.5ML SOPN, Inject 1 mg into the skin once a week., Disp: 6 pen, Rfl: 1 .  gabapentin (NEURONTIN) 100 MG capsule, Take 1 capsule (100 mg total) by mouth 3 (three) times daily., Disp: 90 capsule, Rfl: 0 .  Insulin Aspart (NOVOLOG [place]), Inject 42-48 Units into the skin 2 (two) times daily. 48 in the am and 42 at night., Disp: , Rfl:  .  Insulin Detemir (LEVEMIR FLEXTOUCH) 100 UNIT/ML Pen, Inject 20 Units into the skin at bedtime. (Patient not taking: Reported on 12/13/2018), Disp: 30 mL, Rfl: 1 .  Insulin Pen Needle (NOVOFINE) 32G X 6 MM MISC, ADMINISTER INSULIN 3 TIMES DAILY BEFORE MEALS AND AT BEDTIME AS DIRECTED (Patient not taking: Reported on 12/17/2018), Disp: 300 each, Rfl: 1 .  losartan (COZAAR) 100 MG tablet, Take 1 tablet  (100 mg total) by mouth daily., Disp: 90 tablet, Rfl: 1 .  niacin (NIASPAN) 500 MG CR tablet, TAKE 2 TABLETS AT BEDTIME (Patient not taking: Reported on 12/17/2018), Disp: 180 tablet, Rfl: 1 .  pantoprazole (PROTONIX) 40 MG tablet, Take 1 tablet (40 mg total) by mouth daily., Disp: 90 tablet, Rfl: 1 .  rosuvastatin (CRESTOR) 10 MG tablet, Take 1 tablet (10 mg total) by mouth daily., Disp: 90 tablet, Rfl: 1   No Known Allergies   Review of Systems  Constitutional: Negative.  Negative for fatigue.  Eyes: Negative for blurred vision.  Respiratory: Negative.   Cardiovascular: Negative.   Gastrointestinal: Negative.   Endocrine: Negative for polydipsia, polyphagia and polyuria.  Musculoskeletal: Negative.  Right prosthesis for right above the knee amputation  Skin: Negative.   Neurological: Negative.  Negative for dizziness, syncope, light-headedness and headaches.  Psychiatric/Behavioral: Negative for confusion. The patient is not nervous/anxious.      Today's Vitals   12/17/18 0847  BP: 122/74  Pulse: 81  Temp: 98.2 F (36.8 C)  TempSrc: Oral  PainSc: 0-No pain   There is no height or weight on file to calculate BMI.   Objective:  Physical Exam Cardiovascular:     Rate and Rhythm: Normal rate and regular rhythm.     Pulses: Normal pulses.     Heart sounds: Normal heart sounds. No murmur.  Pulmonary:     Effort: Pulmonary effort is normal.     Breath sounds: Normal breath sounds.  Musculoskeletal:        General: No tenderness.     Left lower leg: Edema present.     Comments: Right above the knee amputation.  Left lower extremity with trace - 1+ edema  Skin:    General: Skin is warm and dry.     Capillary Refill: Capillary refill takes less than 2 seconds.     Findings: No lesion (open areas are healed, small reddened area to left mediall and anterior calf).  Neurological:     General: No focal deficit present.  Psychiatric:        Mood and Affect: Mood normal.         Behavior: Behavior normal.        Thought Content: Thought content normal.        Judgment: Judgment normal.         Assessment And Plan:     1. Type 2 diabetes mellitus with other circulatory complication, with long-term current use of insulin (HCC)  Chronic, blood sugars are improving controlled  Continue with current medications, he is on Tresiba 10 units daily and Ozempic $RemoveBef'1mg'JvJvxSlyJV$  weekly  Encouraged to limit intake of sugary foods and drinks  He has been in contact with Genelle, diabetic educator from [facility] - Lipid panel - CMP14 + Anion Gap - Hemoglobin A1c - TSH  2. Mixed hyperlipidemia  Chronic, controlled  Continue with current medications - Lipid panel - CMP14 + Anion Gap - Hemoglobin A1c - TSH  3. Essential hypertension . B/P is poorly controlled, slightly elevated today . Encouraged to make sure he is staying well hydrated  . CMP ordered to check renal function.  . The importance of regular exercise and dietary modification was stressed to the patient.  . Stressed importance of losing ten percent of her body weight to help with B/P control.  . The weight loss would help with decreasing cardiac and cancer risk as well.   4. PAD (peripheral artery disease) ([place])  Has seen Vascular and the wounds have healed to left lower extremity  Will bring a copy of his pneumonia vaccine [facility]      [name], [name]    THE PATIENT IS ENCOURAGED TO PRACTICE SOCIAL DISTANCING DUE TO THE COVID-19 PANDEMIC.

## 2018-12-17 NOTE — Patient Instructions (Signed)
Bring a copy of your immunization record from [facility] with pneumonia vaccine.

## 2018-12-18 LAB — CMP14 + ANION GAP
ALT: 26 IU/L (ref 0–44)
AST: 25 IU/L (ref 0–40)
Albumin/Globulin Ratio: 1.1 — ABNORMAL LOW (ref 1.2–2.2)
Albumin: 4 g/dL (ref 3.6–4.6)
Alkaline Phosphatase: 86 IU/L (ref 39–117)
Anion Gap: 15 mmol/L (ref 10.0–18.0)
BUN/Creatinine Ratio: 26 — ABNORMAL HIGH (ref 10–24)
BUN: 52 mg/dL — ABNORMAL HIGH (ref 8–27)
Bilirubin Total: 0.3 mg/dL (ref 0.0–1.2)
CO2: 23 mmol/L (ref 20–29)
Calcium: 10.2 mg/dL (ref 8.6–10.2)
Chloride: 103 mmol/L (ref 96–106)
Creatinine, Ser: 2 mg/dL — ABNORMAL HIGH (ref 0.76–1.27)
GFR calc Af Amer: 35 mL/min/{1.73_m2} — ABNORMAL LOW (ref >59)
GFR calc non Af Amer: 30 mL/min/{1.73_m2} — ABNORMAL LOW (ref >59)
Globulin, Total: 3.6 g/dL (ref 1.5–4.5)
Glucose: 143 mg/dL — ABNORMAL HIGH (ref 65–99)
Potassium: 4.3 mmol/L (ref 3.5–5.2)
Sodium: 141 mmol/L (ref 134–144)
Total Protein: 7.6 g/dL (ref 6.0–8.5)

## 2018-12-18 LAB — LIPID PANEL
Chol/HDL Ratio: 2.1 ratio (ref 0.0–5.0)
Cholesterol, Total: 73 mg/dL — ABNORMAL LOW (ref 100–199)
HDL: 35 mg/dL — ABNORMAL LOW (ref >39)
LDL Calculated: 26 mg/dL (ref 0–99)
Triglycerides: 58 mg/dL (ref 0–149)
VLDL Cholesterol Cal: 12 mg/dL (ref 5–40)

## 2018-12-18 LAB — HEMOGLOBIN A1C
Est. average glucose Bld gHb Est-mCnc: 151 mg/dL
Hgb A1c MFr Bld: 6.9 % — ABNORMAL HIGH (ref 4.8–5.6)

## 2018-12-19 ENCOUNTER — Other Ambulatory Visit: Payer: Self-pay

## 2018-12-19 DIAGNOSIS — G629 Polyneuropathy, unspecified: Secondary | ICD-10-CM

## 2018-12-19 DIAGNOSIS — I739 Peripheral vascular disease, unspecified: Secondary | ICD-10-CM

## 2018-12-19 MED ORDER — GABAPENTIN 100 MG PO CAPS: 100.0000 mg | ORAL_CAPSULE | Freq: Three times a day (TID) | ORAL | 1 refills | Status: DC

## 2018-12-24 DIAGNOSIS — H31013 Macula scars of posterior pole (postinflammatory) (post-traumatic), bilateral: Secondary | ICD-10-CM | POA: Diagnosis not present

## 2018-12-24 DIAGNOSIS — H401132 Primary open-angle glaucoma, bilateral, moderate stage: Secondary | ICD-10-CM | POA: Diagnosis not present

## 2018-12-24 DIAGNOSIS — E103293 Type 1 diabetes mellitus with mild nonproliferative diabetic retinopathy without macular edema, bilateral: Secondary | ICD-10-CM | POA: Diagnosis not present

## 2018-12-24 DIAGNOSIS — H35372 Puckering of macula, left eye: Secondary | ICD-10-CM | POA: Diagnosis not present

## 2018-12-24 LAB — HM DIABETES EYE EXAM

## 2018-12-25 ENCOUNTER — Encounter: Payer: Self-pay | Admitting: Nurse Practitioner

## 2019-01-08 ENCOUNTER — Telehealth: Payer: Self-pay | Admitting: *Deleted

## 2019-01-08 ENCOUNTER — Ambulatory Visit: Payer: Medicare Other

## 2019-01-08 ENCOUNTER — Encounter: Payer: Self-pay | Admitting: Podiatry

## 2019-01-08 ENCOUNTER — Other Ambulatory Visit: Payer: Self-pay

## 2019-01-08 ENCOUNTER — Ambulatory Visit (INDEPENDENT_AMBULATORY_CARE_PROVIDER_SITE_OTHER): Payer: Medicare Other

## 2019-01-08 ENCOUNTER — Ambulatory Visit (INDEPENDENT_AMBULATORY_CARE_PROVIDER_SITE_OTHER): Payer: Medicare Other | Admitting: Podiatry

## 2019-01-08 VITALS — Temp 97.6°F

## 2019-01-08 DIAGNOSIS — E1151 Type 2 diabetes mellitus with diabetic peripheral angiopathy without gangrene: Secondary | ICD-10-CM | POA: Diagnosis not present

## 2019-01-08 DIAGNOSIS — L97529 Non-pressure chronic ulcer of other part of left foot with unspecified severity: Secondary | ICD-10-CM | POA: Diagnosis not present

## 2019-01-08 DIAGNOSIS — E11621 Type 2 diabetes mellitus with foot ulcer: Secondary | ICD-10-CM

## 2019-01-08 DIAGNOSIS — Z89511 Acquired absence of right leg below knee: Secondary | ICD-10-CM | POA: Diagnosis not present

## 2019-01-08 MED ORDER — CEPHALEXIN 500 MG PO CAPS: 500.0000 mg | ORAL_CAPSULE | Freq: Two times a day (BID) | ORAL | 0 refills | Status: DC

## 2019-01-08 NOTE — Progress Notes (Signed)
Subjective: Patient presents today for at risk foot care.  He has history of below-knee amputation of the right lower extremity.  He relates he has had an episode of gout since his last visit.  He relates his foot feels better now.  [name], [name] is his PCP.  Medications reviewed.  No Known Allergies   Objective: Vitals:   01/08/19 0857  Temp: 97.6 F (36.4 C)    Vascular Examination: Capillary refill time delayed x5 digits left foot.  Dorsalis pedis pulse nonpalpable left lower extremity.  Posterior tibial pulse nonpalpable left lower extremity.  Digital hair absent on left lower extremity.  Skin temperature gradient within normal limits left lower extremity.     Dermatological Examination: Skin thin, shiny and atrophic b/l.  Toenails 2-5 left foot noted to be discolored, thick, dystrophic with subungual debris and pain with palpation to nailbeds due to thickness of nails.   Left hallux nail plate noted to be loose at the distal one half of the nail bed.  There is sanguinous drainage expressed from this area.  There is a superficial ulceration noted at the medial aspect of the nail bed.  Ulceration measures 1.0 x 1.0 cm and is superficial in nature.  There is mild malodor noted.  There is mild erythema noted distal to IPJ,  no edema, no drainage, no flocculence noted.  He has a healing abrasion noted on the medial aspect of his hallux.  There is no edema, no erythema, no drainage, no flocculence noted with this area.      Musculoskeletal: Muscle strength 5/5 to all LE left LE.  Below-knee amputation right lower extremity.  Motorized wheelchair use for mobility.  Neurological: Sensation diminished with 10 g monofilament left lower extremity  X-ray left foot reveals no evidence of bone erosion of the hallux no gas in tissues.  Assessment: 1. Onychomycosis toenails 1-5 b/l 2. NIDDM with Peripheral arterial disease 3.   Diabetic ulceration left hallux   Plan: 1.  Toenails 1-5 left foot were debrided in length and girth without iatrogenic bleeding. 2. Ulcer was cleansed with wound cleanser.  Povidone ointment was applied to base of wound with light dressing. 3. Xray of the left foot performed and reviewed with patient   4. Prescription written for Keflex 500 mg.  Patient is to take 1 capsule by mouth twice daily. 5. Patient was given instructions on daily dressing changes.  He is to apply povidone ointment to the area once daily and cover with a Band-Aid.  6. We will refer him back to his vascular physician for assessment of the digit.  We will also refer him to wound care for evaluation and treatment. 7. Patient instructed to report to emergency department with worsening appearance of ulcer/toe/foot, increased pain, foul odor, increased redness, swelling, drainage, fever, chills, nightsweats, nausea, vomiting, increased blood sugar.  8. Patient/POA related understanding. 9. Patient/POA to call should there be a concern in the interim.

## 2019-01-08 NOTE — Telephone Encounter (Signed)
Dr. Elisha Ponder assistant, Alric Quan, CMA placed referral for pt with [facility]. Required form, demographics and clinicals faxed to [facility].

## 2019-01-08 NOTE — Patient Instructions (Signed)
DRESSING CHANGES LEFT FOOT:  WEAR SURGICAL SHOE AT ALL TIMES    1. KEEP LEFT  FOOT DRY AT ALL TIMES!!!!  2. CLEANSE ULCER WITH SALINE.  3. DAB DRY WITH GAUZE SPONGE.  4. APPLY A LIGHT AMOUNT OF POVIDINE OINTMENT TO BASE OF ULCER.  5. APPLY OUTER DRESSING/BAND-AID AS INSTRUCTED.  6. WEAR SURGICAL SHOE DAILY AT ALL TIMES.  7. DO NOT WALK BAREFOOT!!!  8.  IF YOU EXPERIENCE ANY FEVER, CHILLS, NIGHTSWEATS, NAUSEA OR VOMITING, ELEVATED OR LOW BLOOD SUGARS, REPORT TO EMERGENCY ROOM.  9. IF YOU EXPERIENCE INCREASED REDNESS, PAIN, SWELLING, DISCOLORATION, ODOR, PUS, DRAINAGE OR WARMTH OF YOUR FOOT, REPORT TO EMERGENCY ROOM.

## 2019-01-10 ENCOUNTER — Telehealth: Payer: Self-pay

## 2019-01-10 NOTE — Telephone Encounter (Signed)
Lizzie from [facility] called regarding pt she needed a call back she stated it was urgent.[phone]  I RETURNED HER CALL AND SPOKE WITH SOMEONE SINCE SHE WAS UNAVAILABLE AND SHE STATED IT WAS REGARDING THE PAPERWORK AND I HAVE NOTIFIED HER THAT WE HAVE FAXED THEM OVER. YRL,RMA

## 2019-01-17 ENCOUNTER — Other Ambulatory Visit: Payer: Self-pay | Admitting: Nurse Practitioner

## 2019-01-23 DIAGNOSIS — E118 Type 2 diabetes mellitus with unspecified complications: Secondary | ICD-10-CM | POA: Diagnosis not present

## 2019-01-23 DIAGNOSIS — N183 Chronic kidney disease, stage 3 (moderate): Secondary | ICD-10-CM | POA: Diagnosis not present

## 2019-01-23 DIAGNOSIS — E785 Hyperlipidemia, unspecified: Secondary | ICD-10-CM | POA: Diagnosis not present

## 2019-01-23 DIAGNOSIS — M109 Gout, unspecified: Secondary | ICD-10-CM | POA: Diagnosis not present

## 2019-01-23 DIAGNOSIS — Z6841 Body Mass Index (BMI) 40.0 and over, adult: Secondary | ICD-10-CM | POA: Diagnosis not present

## 2019-01-23 DIAGNOSIS — I129 Hypertensive chronic kidney disease with stage 1 through stage 4 chronic kidney disease, or unspecified chronic kidney disease: Secondary | ICD-10-CM | POA: Diagnosis not present

## 2019-01-23 DIAGNOSIS — N3289 Other specified disorders of bladder: Secondary | ICD-10-CM | POA: Diagnosis not present

## 2019-02-01 ENCOUNTER — Other Ambulatory Visit: Payer: Self-pay

## 2019-02-01 DIAGNOSIS — I739 Peripheral vascular disease, unspecified: Secondary | ICD-10-CM

## 2019-02-06 ENCOUNTER — Ambulatory Visit (HOSPITAL_COMMUNITY)
Admission: RE | Admit: 2019-02-06 | Discharge: 2019-02-06 | Disposition: A | Discharge status: Home / Self Care | Payer: Medicare Other | Source: Ambulatory Visit | Attending: Vascular Surgery | Admitting: Vascular Surgery

## 2019-02-06 ENCOUNTER — Other Ambulatory Visit: Payer: Self-pay

## 2019-02-06 ENCOUNTER — Encounter: Payer: Self-pay | Admitting: Vascular Surgery

## 2019-02-06 ENCOUNTER — Ambulatory Visit (INDEPENDENT_AMBULATORY_CARE_PROVIDER_SITE_OTHER): Payer: Medicare Other | Admitting: Vascular Surgery

## 2019-02-06 VITALS — BP 130/69 | HR 81 | Temp 97.6°F | Resp 20 | Ht 73.0 in | Wt 290.0 lb

## 2019-02-06 DIAGNOSIS — I739 Peripheral vascular disease, unspecified: Secondary | ICD-10-CM

## 2019-02-06 DIAGNOSIS — L97909 Non-pressure chronic ulcer of unspecified part of unspecified lower leg with unspecified severity: Secondary | ICD-10-CM | POA: Diagnosis not present

## 2019-02-06 DIAGNOSIS — I70299 Other atherosclerosis of native arteries of extremities, unspecified extremity: Secondary | ICD-10-CM

## 2019-02-06 DIAGNOSIS — I872 Venous insufficiency (chronic) (peripheral): Secondary | ICD-10-CM

## 2019-02-06 NOTE — Progress Notes (Signed)
Patient name: Micheal Walls MRN: [id number] DOB: [birth year] Sex: male  REASON FOR VISIT:   Follow-up of peripheral vascular disease and left lower extremity ulcer.  HPI:   Micheal Walls is a pleasant 83 y.o. male who I saw in consultation on 10/10/2018 with peripheral vascular disease and a left lower extremity ulcer.  The patient had combined peripheral vascular disease and chronic venous insufficiency.  At the time of the last visit the toe pressure was 60 suggesting adequate circulation to heal the wound.  I felt that as long as the wound was gradually healing we would not need to proceed with arteriography.  The patient also has stage IV chronic kidney disease and so arteriography would have to be done with CO2.  He comes in for a follow-up visit.  Since I saw him last he denies significant pain in the left leg.  Of note he has a below the knee amputation on the right.  His activity is very limited.  He spends most of the time in a wheelchair.  He tells me that the wound on the left leg has healed.  He denies rest pain.  He has been elevating his leg.  Past Medical History:  Diagnosis Date  . Amputated below knee ([place])    right  . Anemia   . Diastolic heart failure ([place])   . Diverticulosis   . DM (diabetes mellitus) ([place])   . Gout   . Hyperlipemia   . OSA on CPAP   . RBBB   . Systemic hypertension     Family History  Problem Relation Age of Onset  . Diabetes Mother   . Heart attack Father   . Cancer Sister     SOCIAL HISTORY: Social History   Tobacco Use  . Smoking status: Former Smoker    Types: Cigars    Quit date: 06/12/2001    Years since quitting: 17.6  . Smokeless tobacco: Never Used  Substance Use Topics  . Alcohol use: No    Alcohol/week: 0.0 standard drinks    No Known Allergies  Current Outpatient Medications  Medication Sig Dispense Refill  . allopurinol (ZYLOPRIM) 100 MG tablet Take 1 tablet (100 mg total) by mouth daily. (Patient taking  differently: Take 100 mg by mouth 2 (two) times daily. ) 90 tablet 1  . aspirin 81 MG tablet Take 162 mg by mouth daily.    [name] Use a cotton tip applicator. Paint between toes of left foot as needed once daily. 1 Bottle 1  . cephALEXin (KEFLEX) 500 MG capsule Take 1 capsule (500 mg total) by mouth 2 (two) times daily. 20 capsule 0  . colchicine 0.6 MG tablet Take 0.6 mg by mouth as needed (gout flare up).     . Continuous Blood Gluc Receiver (FREESTYLE LIBRE READER) DEVI 1 each by Does not apply route 4 (four) times daily -  before meals and at bedtime. 1 Device 0  . Continuous Blood Gluc Sensor (FREESTYLE LIBRE 14 DAY SENSOR) MISC 1 each by Does not apply route every 14 (fourteen) days. 6 each 3  . diltiazem (CARDIZEM CD) 300 MG 24 hr capsule TAKE ONE CAPSULE EACH DAY 90 capsule 1  . diltiazem (CARDIZEM) 120 MG tablet diltiazem 120 mg tablet  Take 1 tablet 3 times a day by oral route.    . dorzolamide (TRUSOPT) 2 % ophthalmic solution Place 1 drop into both eyes 2 (two) times daily.    Marland Kitchen  doxycycline (VIBRAMYCIN) 100 MG capsule doxycycline hyclate 100 mg capsule  Take 1 capsule twice a day by oral route with meals.    . ferrous sulfate 325 (65 FE) MG tablet Take 325 mg by mouth daily with breakfast.    . fish oil-omega-3 fatty acids 1000 MG capsule Take 2 g by mouth daily.    . furosemide (LASIX) 40 MG tablet Take 1 tablet by mouth twice a day 180 tablet 0  . gabapentin (NEURONTIN) 100 MG capsule Take 1 capsule (100 mg total) by mouth 3 (three) times daily. 270 capsule 1  . hydrocortisone (ANUSOL-HC) 25 MG suppository Place 25 mg rectally 2 (two) times daily as needed for hemorrhoids or anal itching.    . Insulin Aspart (NOVOLOG [place]) Inject 42-48 Units into the skin 2 (two) times daily. 48 in the am and 42 at night.    . insulin degludec (TRESIBA FLEXTOUCH) 100 UNIT/ML SOPN FlexTouch Pen Inject 0.1 mLs (10 Units total) into the skin daily. 15 pen 1  . Insulin Pen  Needle (NOVOFINE) 32G X 6 MM MISC ADMINISTER INSULIN 3 TIMES DAILY BEFORE MEALS AND AT BEDTIME AS DIRECTED 300 each 1  . ketotifen (ALAWAY) 0.025 % ophthalmic solution Place 1 drop into both eyes 2 (two) times daily.    Elmore Guise Devices ([place] NEXT LANCING DEVICE) MISC by Does not apply route. Check blood sugar 3 times per day    . latanoprost (XALATAN) 0.005 % ophthalmic solution     . losartan (COZAAR) 100 MG tablet Take 1 tablet (100 mg total) by mouth daily. 90 tablet 1  . Multiple Vitamin (MULTIVITAMIN) tablet Take 1 tablet by mouth daily.    . niacin (NIASPAN) 500 MG CR tablet TAKE 2 TABLETS AT BEDTIME 180 tablet 1  . pantoprazole (PROTONIX) 40 MG tablet Take 1 tablet (40 mg total) by mouth daily. 90 tablet 1  . Polyvinyl Alcohol-Povidone (REFRESH OP) Apply 1 drop to eye 2 (two) times daily.    . pramoxine-hydrocortisone (PRAMOSONE) cream Apply topically 3 (three) times daily. Apply topically to hemorrhoids four times daily as needed after bowel movements, for external use only    . PRESCRIPTION MEDICATION Place 1 drop into both eyes 2 (two) times daily. For glucoma    . rosuvastatin (CRESTOR) 10 MG tablet Take 1 tablet (10 mg total) by mouth daily. 90 tablet 1  . Semaglutide, [name], (OZEMPIC, [name],) 2 MG/1.5ML SOPN Inject 1 mg into the skin once a week. 6 pen 1  . tamsulosin (FLOMAX) 0.4 MG CAPS capsule     . antiseptic oral rinse (BIOTENE) LIQD 15 mLs by Mouth Rinse route as needed for dry mouth.    . Insulin Detemir (LEVEMIR FLEXTOUCH) 100 UNIT/ML Pen Inject 20 Units into the skin at bedtime. (Patient not taking: Reported on 02/06/2019) 30 mL 1  . NOVOLOG FLEXPEN 100 UNIT/ML FlexPen Inject subcutaneously 42 units every morning and 42 units every evening (max dose) (Patient not taking: Reported on 02/06/2019) 5 pen 1   No current facility-administered medications for this visit.     REVIEW OF SYSTEMS:  $RemoveB'[X]'McCygQDg$  denotes positive finding, $RemoveBeforeDEI'[ ]'UqVBzSJnPSdoNPxL$  denotes negative finding Cardiac   Comments:  Chest pain or chest pressure:    Shortness of breath upon exertion:    Short of breath when lying flat:    Irregular heart rhythm:        Vascular    Pain in calf, thigh, or hip brought on by ambulation:    Pain in  feet at night that wakes you up from your sleep:     Blood clot in your veins:    Leg swelling:         Pulmonary    Oxygen at home: x   Productive cough:     Wheezing:         Neurologic    Sudden weakness in arms or legs:     Sudden numbness in arms or legs:     Sudden onset of difficulty speaking or slurred speech:    Temporary loss of vision in one eye:     Problems with dizziness:  x       Gastrointestinal    Blood in stool:     Vomited blood:         Genitourinary    Burning when urinating:     Blood in urine:        Psychiatric    Major depression:         Hematologic    Bleeding problems:    Problems with blood clotting too easily:        Skin    Rashes or ulcers:        Constitutional    Fever or chills:     PHYSICAL EXAM:   Vitals:   02/06/19 1501  BP: 130/69  Pulse: 81  Resp: 20  Temp: 97.6 F (36.4 C)  SpO2: 97%  Weight: 290 lb (131.5 kg)  Height: $Remove'6\' 1"'BeDqbyy$  (1.854 m)    GENERAL: The patient is a well-nourished male, in no acute distress. The vital signs are documented above. CARDIAC: There is a regular rate and rhythm.  VASCULAR: I do not detect carotid bruits. The wound on the left leg has healed.    In the left leg he has significant left lower extremity swelling which is chronic. He has a monophasic posterior tibial signal with the Doppler and a biphasic dorsalis pedis signal. PULMONARY: There is good air exchange bilaterally without wheezing or rales. ABDOMEN: Soft and non-tender with normal pitched bowel sounds.  MUSCULOSKELETAL: He has a right below the knee amputation. NEUROLOGIC: No focal weakness or paresthesias are detected. SKIN:   PSYCHIATRIC: The patient has a normal affect.  DATA:    ARTERIAL  DUPLEX: I have independently interpreted the arterial duplex of the left lower extremity.  There is triphasic flow noted throughout the common femoral artery, superficial femoral artery and popliteal artery.  There is monophasic flow noted in the tibial peroneal trunk anterior tibial artery and distal posterior tibial artery.  The proximal posterior tibial artery is occluded in the peroneal artery is not visualized.  MEDICAL ISSUES:   COMBINED PERIPHERAL VASCULAR DISEASE AND CHRONIC VENOUS INSUFFICIENCY: This patient has evidence of infrainguinal arterial occlusive disease on the left and also chronic venous insufficiency.  Fortunately he is healed the wound on the left leg.  I have instructed him to continue to elevate his leg to help with the swelling.  I suggested 1-2 pillows given that he has underlying peripheral vascular disease and may develop breast pain if he elevates his leg significantly higher than that.  I have instructed him to keep the skin well lubricated especially in the winter when the air is dry.  I have ordered follow-up ABIs in 1 year and I will see him back at that time.  He knows to call sooner if he has problems.  Deitra Mayo [facility] [phone]

## 2019-03-18 ENCOUNTER — Telehealth: Payer: Self-pay | Admitting: Nurse Practitioner

## 2019-03-18 NOTE — Telephone Encounter (Signed)
I spoke with the patient to remind him of his appointment with Janece on 10/8 and to reschedule his AWV w/ Pamala Hurry.  He asked if he will be able to get the 65+ flu shot when he comes on Thursday.  I told him that I would send a note stating that he would like to have it. VDM (DD)

## 2019-03-18 NOTE — Telephone Encounter (Signed)
Thank you we will ask him when he comes in for his visit

## 2019-03-21 ENCOUNTER — Other Ambulatory Visit: Payer: Self-pay

## 2019-03-21 ENCOUNTER — Ambulatory Visit: Payer: 59

## 2019-03-21 ENCOUNTER — Ambulatory Visit (INDEPENDENT_AMBULATORY_CARE_PROVIDER_SITE_OTHER): Payer: Medicare Other | Admitting: Nurse Practitioner

## 2019-03-21 ENCOUNTER — Encounter: Payer: Self-pay | Admitting: Nurse Practitioner

## 2019-03-21 VITALS — BP 122/60 | HR 86 | Temp 98.4°F | Wt 290.0 lb

## 2019-03-21 DIAGNOSIS — I739 Peripheral vascular disease, unspecified: Secondary | ICD-10-CM

## 2019-03-21 DIAGNOSIS — Z79899 Other long term (current) drug therapy: Secondary | ICD-10-CM

## 2019-03-21 DIAGNOSIS — E1159 Type 2 diabetes mellitus with other circulatory complications: Secondary | ICD-10-CM

## 2019-03-21 DIAGNOSIS — Z23 Encounter for immunization: Secondary | ICD-10-CM | POA: Diagnosis not present

## 2019-03-21 DIAGNOSIS — Z89611 Acquired absence of right leg above knee: Secondary | ICD-10-CM

## 2019-03-21 DIAGNOSIS — I10 Essential (primary) hypertension: Secondary | ICD-10-CM

## 2019-03-21 DIAGNOSIS — E782 Mixed hyperlipidemia: Secondary | ICD-10-CM

## 2019-03-21 DIAGNOSIS — Z794 Long term (current) use of insulin: Secondary | ICD-10-CM

## 2019-03-21 NOTE — Progress Notes (Signed)
Subjective:     Patient ID: Micheal Walls , male    DOB: [birth year] , 83 y.o.   MRN: [id number]   Chief Complaint  Patient presents with  . Diabetes    HPI  Diabetes He presents for his follow-up diabetic visit. He has type 2 diabetes mellitus. His disease course has been improving. Pertinent negatives for hypoglycemia include no confusion, dizziness, headaches or nervousness/anxiousness. Pertinent negatives for diabetes include no blurred vision, no fatigue, no polydipsia, no polyphagia and no polyuria. There are no hypoglycemic complications. Symptoms are improving. There are no diabetic complications. Risk factors for coronary artery disease include sedentary lifestyle and obesity. Current diabetic treatment includes oral agent (dual therapy) and insulin injections (He is administering 10 units of Tresiba). He is compliant with treatment all of the time. He is following a diabetic diet. He rarely participates in exercise. His home blood glucose trend is decreasing steadily. (Blood sugars are ranging in the 120's to 130's.  ) An ACE inhibitor/angiotensin II receptor blocker is being taken. He does not see a podiatrist.Eye exam is current (Dr. Barbaraann Barthel - March 2020).  Hypertension This is a chronic problem. The current episode started more than 1 year ago. The problem is unchanged. The problem is uncontrolled. Associated symptoms include peripheral edema (left lower extremity). Pertinent negatives include no anxiety, blurred vision or headaches. There are no associated agents to hypertension. Risk factors for coronary artery disease include obesity and sedentary lifestyle. Past treatments include ACE inhibitors. There are no compliance problems.  Hypertensive end-organ damage includes kidney disease. There is no history of angina. Identifiable causes of hypertension include chronic renal disease.     Past Medical History:  Diagnosis Date  . Amputated below knee (HCC)    right  . Anemia   .  Diastolic heart failure (HCC)   . Diverticulosis   . DM (diabetes mellitus) (HCC)   . Gout   . Hyperlipemia   . OSA on CPAP   . RBBB   . Systemic hypertension      Family History  Problem Relation Age of Onset  . Diabetes Mother   . Heart attack Father   . Cancer Sister      Current Outpatient Medications:  .  allopurinol (ZYLOPRIM) 100 MG tablet, Take 1 tablet (100 mg total) by mouth daily. (Patient taking differently: Take 100 mg by mouth 2 (two) times daily. ), Disp: 90 tablet, Rfl: 1 .  antiseptic oral rinse (BIOTENE) LIQD, 15 mLs by Mouth Rinse route as needed for dry mouth., Disp: , Rfl:  .  aspirin 81 MG tablet, Take 162 mg by mouth daily., Disp: , Rfl:  .  [place] Paint Modified 1.5 % LIQD, Use a cotton tip applicator. Paint between toes of left foot as needed once daily., Disp: 1 Bottle, Rfl: 1 .  cephALEXin (KEFLEX) 500 MG capsule, Take 1 capsule (500 mg total) by mouth 2 (two) times daily., Disp: 20 capsule, Rfl: 0 .  colchicine 0.6 MG tablet, Take 0.6 mg by mouth as needed (gout flare up). , Disp: , Rfl:  .  Continuous Blood Gluc Receiver (FREESTYLE LIBRE READER) DEVI, 1 each by Does not apply route 4 (four) times daily -  before meals and at bedtime., Disp: 1 Device, Rfl: 0 .  Continuous Blood Gluc Sensor (FREESTYLE LIBRE 14 DAY SENSOR) MISC, 1 each by Does not apply route every 14 (fourteen) days., Disp: 6 each, Rfl: 3 .  diltiazem (CARDIZEM CD) 300 MG 24 hr capsule,  TAKE ONE CAPSULE EACH DAY, Disp: 90 capsule, Rfl: 1 .  diltiazem (CARDIZEM) 120 MG tablet, diltiazem 120 mg tablet  Take 1 tablet 3 times a day by oral route., Disp: , Rfl:  .  dorzolamide (TRUSOPT) 2 % ophthalmic solution, Place 1 drop into both eyes 2 (two) times daily., Disp: , Rfl:  .  doxycycline (VIBRAMYCIN) 100 MG capsule, doxycycline hyclate 100 mg capsule  Take 1 capsule twice a day by oral route with meals., Disp: , Rfl:  .  ferrous sulfate 325 (65 FE) MG tablet, Take 325 mg by mouth daily with  breakfast., Disp: , Rfl:  .  fish oil-omega-3 fatty acids 1000 MG capsule, Take 2 g by mouth daily., Disp: , Rfl:  .  furosemide (LASIX) 40 MG tablet, Take 1 tablet by mouth twice a day, Disp: 180 tablet, Rfl: 0 .  gabapentin (NEURONTIN) 100 MG capsule, Take 1 capsule (100 mg total) by mouth 3 (three) times daily., Disp: 270 capsule, Rfl: 1 .  hydrocortisone (ANUSOL-HC) 25 MG suppository, Place 25 mg rectally 2 (two) times daily as needed for hemorrhoids or anal itching., Disp: , Rfl:  .  Insulin Aspart (NOVOLOG [place]), Inject 42-48 Units into the skin 2 (two) times daily. 48 in the am and 42 at night., Disp: , Rfl:  .  insulin degludec (TRESIBA FLEXTOUCH) 100 UNIT/ML SOPN FlexTouch Pen, Inject 0.1 mLs (10 Units total) into the skin daily., Disp: 15 pen, Rfl: 1 .  Insulin Detemir (LEVEMIR FLEXTOUCH) 100 UNIT/ML Pen, Inject 20 Units into the skin at bedtime., Disp: 30 mL, Rfl: 1 .  Insulin Pen Needle (NOVOFINE) 32G X 6 MM MISC, ADMINISTER INSULIN 3 TIMES DAILY BEFORE MEALS AND AT BEDTIME AS DIRECTED, Disp: 300 each, Rfl: 1 .  ketotifen (ALAWAY) 0.025 % ophthalmic solution, Place 1 drop into both eyes 2 (two) times daily., Disp: , Rfl:  .  Lancet Devices (MICROLET NEXT LANCING DEVICE) MISC, by Does not apply route. Check blood sugar 3 times per day, Disp: , Rfl:  .  latanoprost (XALATAN) 0.005 % ophthalmic solution, , Disp: , Rfl:  .  losartan (COZAAR) 100 MG tablet, Take 1 tablet (100 mg total) by mouth daily., Disp: 90 tablet, Rfl: 1 .  Multiple Vitamin (MULTIVITAMIN) tablet, Take 1 tablet by mouth daily., Disp: , Rfl:  .  niacin (NIASPAN) 500 MG CR tablet, TAKE 2 TABLETS AT BEDTIME, Disp: 180 tablet, Rfl: 1 .  NOVOLOG FLEXPEN 100 UNIT/ML FlexPen, Inject subcutaneously 42 units every morning and 42 units every evening (max dose), Disp: 5 pen, Rfl: 1 .  pantoprazole (PROTONIX) 40 MG tablet, Take 1 tablet (40 mg total) by mouth daily., Disp: 90 tablet, Rfl: 1 .  Polyvinyl Alcohol-Povidone (REFRESH OP),  Apply 1 drop to eye 2 (two) times daily., Disp: , Rfl:  .  pramoxine-hydrocortisone (PRAMOSONE) cream, Apply topically 3 (three) times daily. Apply topically to hemorrhoids four times daily as needed after bowel movements, for external use only, Disp: , Rfl:  .  PRESCRIPTION MEDICATION, Place 1 drop into both eyes 2 (two) times daily. For glucoma, Disp: , Rfl:  .  rosuvastatin (CRESTOR) 10 MG tablet, Take 1 tablet (10 mg total) by mouth daily., Disp: 90 tablet, Rfl: 1 .  Semaglutide, [name], (OZEMPIC, [name],) 2 MG/1.5ML SOPN, Inject 1 mg into the skin once a week., Disp: 6 pen, Rfl: 1 .  tamsulosin (FLOMAX) 0.4 MG CAPS capsule, , Disp: , Rfl:    No Known Allergies   Review  of Systems  Constitutional: Negative.  Negative for fatigue.  Eyes: Negative.  Negative for blurred vision.  Respiratory: Negative.   Cardiovascular: Negative.   Gastrointestinal: Negative.   Endocrine: Negative for polydipsia, polyphagia and polyuria.  Musculoskeletal: Negative.        Right prosthesis for right above the knee amputation  Skin: Negative.   Neurological: Negative.  Negative for dizziness, syncope, light-headedness and headaches.  Psychiatric/Behavioral: Negative for confusion. The patient is not nervous/anxious.      Today's Vitals   03/21/19 0902  BP: 122/60  Pulse: 86  Temp: 98.4 F (36.9 C)  TempSrc: Oral  Weight: 290 lb (131.5 kg)  PainSc: 0-No pain   Body mass index is 38.26 kg/m.   Objective:  Physical Exam Constitutional:      General: He is not in acute distress.    Appearance: Normal appearance. He is obese.  Cardiovascular:     Rate and Rhythm: Normal rate and regular rhythm.     Pulses: Normal pulses.     Heart sounds: Normal heart sounds. No murmur.  Pulmonary:     Effort: Pulmonary effort is normal. No respiratory distress.     Breath sounds: Normal breath sounds.  Musculoskeletal:        General: No tenderness.     Left lower leg: Edema present.     Comments:  Right above the knee amputation.  Left lower extremity with trace - 1+ edema  Skin:    General: Skin is warm and dry.     Capillary Refill: Capillary refill takes less than 2 seconds.     Findings: No erythema or lesion (healed).  Neurological:     General: No focal deficit present.     Mental Status: He is alert.  Psychiatric:        Mood and Affect: Mood normal.        Behavior: Behavior normal.        Thought Content: Thought content normal.        Judgment: Judgment normal.         Assessment And Plan:     1. Type 2 diabetes mellitus with other circulatory complication, with long-term current use of insulin (HCC)  Chronic, blood sugars are improving controlled  He continues to take [place] 10 units daily and Ozempic $RemoveBef'1mg'[id number]$  weekly  Encouraged to limit intake of sugary foods and drinks  He has been in contact with [place], diabetic educator from [place] - Lipid panel - CMP14 + Anion Gap - Hemoglobin A1c - TSH  2. Mixed hyperlipidemia  Chronic, controlled  Continue with current medications - Lipid panel - CMP14 + Anion Gap - Hemoglobin A1c - TSH  3. Essential hypertension . B/P is poorly controlled, slightly elevated today . Encouraged to make sure he is staying well hydrated  . CMP ordered to check renal function.  . The importance of regular exercise and dietary modification was stressed to the patient.  . Stressed importance of losing ten percent of her body weight to help with B/P control.  . The weight loss would help with decreasing cardiac and cancer risk as well.   4. PAD (peripheral artery disease) ([place])  Has seen Vascular and the wounds have healed to left lower extremity  5. Other long term (current) drug therapy  - TSH  6. Need for influenza vaccination  Influenza vaccine given in office  Advised to take Tylenol as needed for muscle aches or fever - Flu vaccine HIGH DOSE PF (Fluzone High  dose)       [name], [name]    THE PATIENT  IS ENCOURAGED TO PRACTICE SOCIAL DISTANCING DUE TO THE COVID-19 PANDEMIC.

## 2019-03-22 LAB — CMP14 + ANION GAP
ALT: 20 IU/L (ref 0–44)
AST: 22 IU/L (ref 0–40)
Albumin/Globulin Ratio: 1.3 (ref 1.2–2.2)
Albumin: 3.9 g/dL (ref 3.6–4.6)
Alkaline Phosphatase: 85 IU/L (ref 39–117)
Anion Gap: 15 mmol/L (ref 10.0–18.0)
BUN/Creatinine Ratio: 24 (ref 10–24)
BUN: 60 mg/dL — ABNORMAL HIGH (ref 8–27)
Bilirubin Total: 0.4 mg/dL (ref 0.0–1.2)
CO2: 21 mmol/L (ref 20–29)
Calcium: 9.8 mg/dL (ref 8.6–10.2)
Chloride: 103 mmol/L (ref 96–106)
Creatinine, Ser: 2.53 mg/dL — ABNORMAL HIGH (ref 0.76–1.27)
GFR calc Af Amer: 26 mL/min/{1.73_m2} — ABNORMAL LOW (ref >59)
GFR calc non Af Amer: 23 mL/min/{1.73_m2} — ABNORMAL LOW (ref >59)
Globulin, Total: 2.9 g/dL (ref 1.5–4.5)
Glucose: 160 mg/dL — ABNORMAL HIGH (ref 65–99)
Potassium: 4.6 mmol/L (ref 3.5–5.2)
Sodium: 139 mmol/L (ref 134–144)
Total Protein: 6.8 g/dL (ref 6.0–8.5)

## 2019-03-22 LAB — LIPID PANEL
Chol/HDL Ratio: 2.3 ratio (ref 0.0–5.0)
Cholesterol, Total: 79 mg/dL — ABNORMAL LOW (ref 100–199)
HDL: 34 mg/dL — ABNORMAL LOW (ref >39)
LDL Chol Calc (NIH): 32 mg/dL (ref 0–99)
Triglycerides: 49 mg/dL (ref 0–149)
VLDL Cholesterol Cal: 13 mg/dL (ref 5–40)

## 2019-03-22 LAB — TSH: TSH: 2.76 u[IU]/mL (ref 0.450–4.500)

## 2019-03-22 LAB — HEMOGLOBIN A1C
Est. average glucose Bld gHb Est-mCnc: 166 mg/dL
Hgb A1c MFr Bld: 7.4 % — ABNORMAL HIGH (ref 4.8–5.6)

## 2019-04-10 ENCOUNTER — Other Ambulatory Visit: Payer: Self-pay

## 2019-04-10 ENCOUNTER — Encounter: Payer: Self-pay | Admitting: Podiatry

## 2019-04-10 ENCOUNTER — Ambulatory Visit (INDEPENDENT_AMBULATORY_CARE_PROVIDER_SITE_OTHER): Payer: Medicare Other | Admitting: Podiatry

## 2019-04-10 ENCOUNTER — Ambulatory Visit: Payer: 59

## 2019-04-10 DIAGNOSIS — Z89511 Acquired absence of right leg below knee: Secondary | ICD-10-CM

## 2019-04-10 DIAGNOSIS — B353 Tinea pedis: Secondary | ICD-10-CM

## 2019-04-10 DIAGNOSIS — B351 Tinea unguium: Secondary | ICD-10-CM | POA: Diagnosis not present

## 2019-04-10 DIAGNOSIS — E1151 Type 2 diabetes mellitus with diabetic peripheral angiopathy without gangrene: Secondary | ICD-10-CM

## 2019-04-10 MED ORDER — OZEMPIC (1 MG/DOSE) 2 MG/1.5ML ~~LOC~~ SOPN: 1.0000 mg | PEN_INJECTOR | SUBCUTANEOUS | 1 refills | Status: DC

## 2019-04-10 NOTE — Patient Instructions (Signed)
Diabetes Mellitus and Foot Care Foot care is an important part of your health, especially when you have diabetes. Diabetes may cause you to have problems because of poor blood flow (circulation) to your feet and legs, which can cause your skin to:  Become thinner and drier.  Break more easily.  Heal more slowly.  Peel and crack. You may also have nerve damage (neuropathy) in your legs and feet, causing decreased feeling in them. This means that you may not notice minor injuries to your feet that could lead to more serious problems. Noticing and addressing any potential problems early is the best way to prevent future foot problems. How to care for your feet Foot hygiene  Wash your feet daily with warm water and mild soap. Do not use hot water. Then, pat your feet and the areas between your toes until they are completely dry. Do not soak your feet as this can dry your skin.  Trim your toenails straight across. Do not dig under them or around the cuticle. File the edges of your nails with an emery board or nail file.  Apply a moisturizing lotion or petroleum jelly to the skin on your feet and to dry, brittle toenails. Use lotion that does not contain alcohol and is unscented. Do not apply lotion between your toes. Shoes and socks  Wear clean socks or stockings every day. Make sure they are not too tight. Do not wear knee-high stockings since they may decrease blood flow to your legs.  Wear shoes that fit properly and have enough cushioning. Always look in your shoes before you put them on to be sure there are no objects inside.  To break in new shoes, wear them for just a few hours a day. This prevents injuries on your feet. Wounds, scrapes, corns, and calluses  Check your feet daily for blisters, cuts, bruises, sores, and redness. If you cannot see the bottom of your feet, use a mirror or ask someone for help.  Do not cut corns or calluses or try to remove them with medicine.  If you  find a minor scrape, cut, or break in the skin on your feet, keep it and the skin around it clean and dry. You may clean these areas with mild soap and water. Do not clean the area with peroxide, alcohol, or iodine.  If you have a wound, scrape, corn, or callus on your foot, look at it several times a day to make sure it is healing and not infected. Check for: ? Redness, swelling, or pain. ? Fluid or blood. ? Warmth. ? Pus or a bad smell. General instructions  Do not cross your legs. This may decrease blood flow to your feet.  Do not use heating pads or hot water bottles on your feet. They may burn your skin. If you have lost feeling in your feet or legs, you may not know this is happening until it is too late.  Protect your feet from hot and cold by wearing shoes, such as at the beach or on hot pavement.  Schedule a complete foot exam at least once a year (annually) or more often if you have foot problems. If you have foot problems, report any cuts, sores, or bruises to your health care provider immediately. Contact a health care provider if:  You have a medical condition that increases your risk of infection and you have any cuts, sores, or bruises on your feet.  You have an injury that is not   healing.  You have redness on your legs or feet.  You feel burning or tingling in your legs or feet.  You have pain or cramps in your legs and feet.  Your legs or feet are numb.  Your feet always feel cold.  You have pain around a toenail. Get help right away if:  You have a wound, scrape, corn, or callus on your foot and: ? You have pain, swelling, or redness that gets worse. ? You have fluid or blood coming from the wound, scrape, corn, or callus. ? Your wound, scrape, corn, or callus feels warm to the touch. ? You have pus or a bad smell coming from the wound, scrape, corn, or callus. ? You have a fever. ? You have a red line going up your leg. Summary  Check your feet every day  for cuts, sores, red spots, swelling, and blisters.  Moisturize feet and legs daily.  Wear shoes that fit properly and have enough cushioning.  If you have foot problems, report any cuts, sores, or bruises to your health care provider immediately.  Schedule a complete foot exam at least once a year (annually) or more often if you have foot problems. This information is not intended to replace advice given to you by your health care provider. Make sure you discuss any questions you have with your health care provider. Document Released: 05/27/2000 Document Revised: 07/12/2017 Document Reviewed: 07/01/2016 Elsevier Patient Education  2020 [employer]  

## 2019-04-12 NOTE — Progress Notes (Signed)
Subjective: Micheal Walls is a 83 y.o. y.o. male who presents today for follow up  of mycotic toenails which interfere with daily activities. Pain is aggravated when wearing enclosed shoe gear and relieved with periodic professional debridement.  Micheal Walls has h/o BKA of RLE.  He relates his left great toe has healed.  Micheal Brine, [name] is his PCP.   He states he still has a full bottle of Nystatin Powder left.  Current Outpatient Medications on File Prior to Visit  Medication Sig Dispense Refill  . allopurinol (ZYLOPRIM) 100 MG tablet Take 1 tablet (100 mg total) by mouth daily. (Patient taking differently: Take 100 mg by mouth 2 (two) times daily. ) 90 tablet 1  . antiseptic oral rinse (BIOTENE) LIQD 15 mLs by Mouth Rinse route as needed for dry mouth.    Mar[place] Kitchen aspirin 81 MG tablet Take 162 mg by mouth daily.    [name] Use a cotton tip applicator. Paint between toes of left foot as needed once daily. 1 Bottle 1  . colchicine 0.6 MG tablet Take 0.6 mg by mouth as needed (gout flare up).     . Continuous Blood Gluc Receiver (FREESTYLE LIBRE READER) DEVI 1 each by Does not apply route 4 (four) times daily -  before meals and at bedtime. 1 Device 0  . Continuous Blood Gluc Sensor (FREESTYLE LIBRE 14 DAY SENSOR) MISC 1 each by Does not apply route every 14 (fourteen) days. 6 each 3  . diltiazem (CARDIZEM CD) 300 MG 24 hr capsule TAKE ONE CAPSULE EACH DAY 90 capsule 1  . diltiazem (CARDIZEM) 120 MG tablet diltiazem 120 mg tablet  Take 1 tablet 3 times a day by oral route.    . diltiazem (TIAZAC) 300 MG 24 hr capsule Take 300 mg by mouth as directed.    . dorzolamide (TRUSOPT) 2 % ophthalmic solution Place 1 drop into both eyes 2 (two) times daily.    . ferrous sulfate 325 (65 FE) MG tablet Take 325 mg by mouth daily with breakfast.    . fish oil-omega-3 fatty acids 1000 MG capsule Take 2 g by mouth daily.    . furosemide (LASIX) 40 MG tablet Take 1 tablet by  mouth twice a day 180 tablet 0  . gabapentin (NEURONTIN) 100 MG capsule Take 1 capsule (100 mg total) by mouth 3 (three) times daily. 270 capsule 1  . hydrocortisone (ANUSOL-HC) 25 MG suppository Place 25 mg rectally 2 (two) times daily as needed for hemorrhoids or anal itching.    . Insulin Aspart (NOVOLOG [place]) Inject 42-48 Units into the skin 2 (two) times daily. 48 in the am and 42 at night.    . insulin degludec (TRESIBA FLEXTOUCH) 100 UNIT/ML SOPN FlexTouch Pen Inject 0.1 mLs (10 Units total) into the skin daily. 15 pen 1  . Insulin Pen Needle (NOVOFINE) 32G X 6 MM MISC ADMINISTER INSULIN 3 TIMES DAILY BEFORE MEALS AND AT BEDTIME AS DIRECTED 300 each 1  . ketotifen (ALAWAY) 0.025 % ophthalmic solution Place 1 drop into both eyes 2 (two) times daily.    [place]more Guise Devices ([place] NEXT LANCING DEVICE) MISC by Does not apply route. Check blood sugar 3 times per day    . latanoprost (XALATAN) 0.005 % ophthalmic solution     . losartan (COZAAR) 100 MG tablet Take 1 tablet (100 mg total) by mouth daily. 90 tablet 1  . losartan (COZAAR) 50 MG tablet Take 50 mg by  mouth as directed.    . Multiple Vitamin (MULTIVITAMIN) tablet Take 1 tablet by mouth daily.    . niacin (NIASPAN) 500 MG CR tablet TAKE 2 TABLETS AT BEDTIME 180 tablet 1  . pantoprazole (PROTONIX) 40 MG tablet Take 1 tablet (40 mg total) by mouth daily. 90 tablet 1  . Polyvinyl Alcohol-Povidone (REFRESH OP) Apply 1 drop to eye 2 (two) times daily.    . pramoxine-hydrocortisone (PRAMOSONE) cream Apply topically 3 (three) times daily. Apply topically to hemorrhoids four times daily as needed after bowel movements, for external use only    . PRESCRIPTION MEDICATION Place 1 drop into both eyes 2 (two) times daily. For glucoma    . REFRESH PLUS 0.5 % SOLN Apply 1 drop to eye 2 (two) times daily.    . rosuvastatin (CRESTOR) 10 MG tablet Take 1 tablet (10 mg total) by mouth daily. 90 tablet 1  . tamsulosin (FLOMAX) 0.4 MG CAPS capsule      No  current facility-administered medications on file prior to visit.     No Known Allergies  Objective: There were no vitals filed for this visit.  Vascular Examination: Capillary refill time less than 3 seconds left foot.   Dorsalis pedis pulses nonpalpable b/l.  Posterior tibial pulses nonpalpable b/l.  Digital hair absent left foot.  Skin temperature gradient WNL left LE.  Dermatological Examination: Skin with normal turgor, texture and tone left LE.  Toenails 1-5 left foot discolored, thick, dystrophic with subungual debris.  Left great toe ulceration healed.    +Interdigital maceration noted webspaces 1-4 left foot. No open wounds, no erythema, no edema, no drainage. No signs of deep space infection.  Musculoskeletal: Muscle strength 5/5 to all LE muscle groups of LLE.  BKA of RLE.  Neurological: Sensation diminished LLE with 10 gram monofilament.  Vibratory sensation diminished LLE.  Assessment: 1. Onychomycosis toenails 1-5 left foot 2. Healed ulceration left great toe 3. S/p BKA RLE 4.  Interdigital tinea pedis left foot 5. NIDDM with PAD  Plan: 1. Continue diabetic foot care principles. Literature dispensed on today. 2. Toenails 1-5 left foot were debrided in length and girth without iatrogenic bleeding. 3. Restart Nystatin Powder to webspaces daily for interdigital tinea pedis.  4. Patient to continue soft, supportive shoe gear daily. 5. Patient to report any pedal injuries to medical professional immediately. 6. Follow up 3 months.  7. Patient/POA to call should there be a concern in the interim.

## 2019-04-29 DIAGNOSIS — E113593 Type 2 diabetes mellitus with proliferative diabetic retinopathy without macular edema, bilateral: Secondary | ICD-10-CM | POA: Diagnosis not present

## 2019-04-29 DIAGNOSIS — H353132 Nonexudative age-related macular degeneration, bilateral, intermediate dry stage: Secondary | ICD-10-CM | POA: Diagnosis not present

## 2019-04-29 DIAGNOSIS — H35043 Retinal micro-aneurysms, unspecified, bilateral: Secondary | ICD-10-CM | POA: Diagnosis not present

## 2019-04-29 DIAGNOSIS — H35372 Puckering of macula, left eye: Secondary | ICD-10-CM | POA: Diagnosis not present

## 2019-05-06 DIAGNOSIS — H401132 Primary open-angle glaucoma, bilateral, moderate stage: Secondary | ICD-10-CM | POA: Diagnosis not present

## 2019-05-07 ENCOUNTER — Other Ambulatory Visit: Payer: Self-pay | Admitting: Nurse Practitioner

## 2019-05-15 ENCOUNTER — Other Ambulatory Visit: Payer: Self-pay

## 2019-05-15 ENCOUNTER — Ambulatory Visit (INDEPENDENT_AMBULATORY_CARE_PROVIDER_SITE_OTHER): Payer: Medicare Other

## 2019-05-15 VITALS — BP 138/64 | HR 86 | Temp 98.8°F

## 2019-05-15 DIAGNOSIS — E1169 Type 2 diabetes mellitus with other specified complication: Secondary | ICD-10-CM

## 2019-05-15 DIAGNOSIS — Z0000 Encounter for general adult medical examination without abnormal findings: Secondary | ICD-10-CM

## 2019-05-15 DIAGNOSIS — Z23 Encounter for immunization: Secondary | ICD-10-CM | POA: Diagnosis not present

## 2019-05-15 DIAGNOSIS — E669 Obesity, unspecified: Secondary | ICD-10-CM | POA: Diagnosis not present

## 2019-05-15 LAB — POCT URINALYSIS DIPSTICK
Bilirubin, UA: NEGATIVE
Blood, UA: NEGATIVE
Glucose, UA: NEGATIVE
Ketones, UA: NEGATIVE
Leukocytes, UA: NEGATIVE
Nitrite, UA: NEGATIVE
Protein, UA: POSITIVE — AB
Spec Grav, UA: 1.015 (ref 1.010–1.025)
Urobilinogen, UA: 0.2 E.U./dL
pH, UA: 7 (ref 5.0–8.0)

## 2019-05-15 LAB — POCT UA - MICROALBUMIN
Creatinine, POC: 50 mg/dL
Microalbumin Ur, POC: 80 mg/L

## 2019-05-15 NOTE — Progress Notes (Signed)
This visit occurred during the SARS-CoV-2 public health emergency.  Safety protocols were in place, including screening questions prior to the visit, additional usage of staff PPE, and extensive cleaning of exam room while observing appropriate contact time as indicated for disinfecting solutions.  Subjective:   Micheal Walls is a 83 y.o. male who presents for Medicare Annual/Subsequent preventive examination.  Review of Systems:  n/a Cardiac Risk Factors include: advanced age (>48men, >51 women);diabetes mellitus;hypertension;male gender;sedentary lifestyle;obesity (BMI >30kg/m2)     Objective:    Vitals: BP 138/64 (BP Location: Left Arm, Patient Position: Sitting, Cuff Size: Large)   Pulse 86   Temp 98.8 F (37.1 C) (Oral)   SpO2 96%   There is no height or weight on file to calculate BMI.  Advanced Directives 05/15/2019 02/06/2019 10/10/2018 03/15/2018 02/23/2015  Does Patient Have a Medical Advance Directive? No No No Yes No  Type of Advance Directive - - - Living will -  Does patient want to make changes to medical advance directive? - - - No - Patient declined -  Would patient like information on creating a medical advance directive? - No - Patient declined No - Patient declined - No - patient declined information    Tobacco Social History   Tobacco Use  Smoking Status Former Smoker  . Types: Cigars  . Quit date: 06/12/2001  . Years since quitting: 17.9  Smokeless Tobacco Never Used     Counseling given: Not Answered   Clinical Intake:  Pre-visit preparation completed: Yes  Pain : No/denies pain     Diabetes: Yes CBG done?: No Did pt. bring in CBG monitor from home?: No  How often do you need to have someone help you when you read instructions, pamphlets, or other written materials from your doctor or pharmacy?: 1 - Never What is the last grade level you completed in school?: 12th grade  Interpreter Needed?: No  Information entered by :: NAllen LPN   Past Medical History:  Diagnosis Date  . 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 Anemia   . Diastolic heart failure ([place])   . Diverticulosis   . DM (diabetes mellitus) ([place])   . Gout   . Hyperlipemia   . OSA on CPAP   . RBBB   . Systemic hypertension    Past Surgical History:  Procedure Laterality Date  . LEG AMPUTATION BELOW KNEE     right  . NM MYOCAR PERF WALL MOTION  09/18/2009   no ischemia   Family History  Problem Relation Age of Onset  . Diabetes Mother   . Heart attack Father   . Cancer Sister    Social History   Socioeconomic History  . Marital status: Married    Spouse name: Not on file  . Number of children: Not on file  . Years of education: Not on file  . Highest education level: Not on file  Occupational History  . Occupation: retired  Scientific laboratory technician  . Financial resource strain: Not hard at all  . Food insecurity    Worry: Never true    Inability: Never true  . Transportation needs    Medical: No    Non-medical: No  Tobacco Use  . Smoking status: Former Smoker    Types: Cigars    Quit date: 06/12/2001    Years since quitting: 17.9  . Smokeless tobacco: Never Used  Substance and Sexual Activity  . Alcohol use: No    Alcohol/week: 0.0 standard  drinks  . Drug use: No  . Sexual activity: Not Currently  Lifestyle  . Physical activity    Days per week: 2 days    Minutes per session: 10 min  . Stress: Not at all  Relationships  . Social Herbalist on phone: Not on file    Gets together: Not on file    Attends religious service: Not on file    Active member of club or organization: Not on file    Attends meetings of clubs or organizations: Not on file    Relationship status: Not on file  Other Topics Concern  . Not on file  Social History Narrative  . Not on file    Outpatient Encounter Medications as of 05/15/2019  Medication Sig  . allopurinol (ZYLOPRIM) 100 MG tablet Take 1 tablet (100 mg total) by mouth daily. (Patient  taking differently: Take 100 mg by mouth 2 (two) times daily. )  . antiseptic oral rinse (BIOTENE) LIQD 15 mLs by Mouth Rinse route as needed for dry mouth.  Marland Kitchen aspirin 81 MG tablet Take 162 mg by mouth daily.  [name] Use a cotton tip applicator. Paint between toes of left foot as needed once daily.  . colchicine 0.6 MG tablet Take 0.6 mg by mouth as needed (gout flare up).   . Continuous Blood Gluc Receiver (FREESTYLE LIBRE READER) DEVI 1 each by Does not apply route 4 (four) times daily -  before meals and at bedtime.  . Continuous Blood Gluc Sensor (FREESTYLE LIBRE 14 DAY SENSOR) MISC 1 each by Does not apply route every 14 (fourteen) days.  Marland Kitchen diltiazem (CARDIZEM CD) 300 MG 24 hr capsule TAKE ONE CAPSULE EACH DAY  . dorzolamide (TRUSOPT) 2 % ophthalmic solution Place 1 drop into both eyes 2 (two) times daily.  . ferrous sulfate 325 (65 FE) MG tablet Take 325 mg by mouth daily with breakfast.  . fish oil-omega-3 fatty acids 1000 MG capsule Take 2 g by mouth daily.  . furosemide (LASIX) 40 MG tablet Take 1 tablet by mouth twice a day  . gabapentin (NEURONTIN) 100 MG capsule Take 1 capsule (100 mg total) by mouth 3 (three) times daily.  . hydrocortisone (ANUSOL-HC) 25 MG suppository Place 25 mg rectally 2 (two) times daily as needed for hemorrhoids or anal itching.  . insulin degludec (TRESIBA FLEXTOUCH) 100 UNIT/ML SOPN FlexTouch Pen Inject 0.1 mLs (10 Units total) into the skin daily.  . Insulin Pen Needle (NOVOFINE) 32G X 6 MM MISC ADMINISTER INSULIN 3 TIMES DAILY BEFORE MEALS AND AT BEDTIME AS DIRECTED  . ketotifen (ALAWAY) 0.025 % ophthalmic solution Place 1 drop into both eyes 2 (two) times daily.  Elmore Guise Devices ([place] NEXT LANCING DEVICE) MISC by Does not apply route. Check blood sugar 3 times per day  . latanoprost (XALATAN) 0.005 % ophthalmic solution   . losartan (COZAAR) 100 MG tablet Take 1 tablet (100 mg total) by mouth daily.  . Multiple Vitamin  (MULTIVITAMIN) tablet Take 1 tablet by mouth daily.  . niacin (NIASPAN) 500 MG CR tablet TAKE 2 TABLETS AT BEDTIME  . pantoprazole (PROTONIX) 40 MG tablet Take 1 tablet (40 mg total) by mouth daily.  . Polyvinyl Alcohol-Povidone (REFRESH OP) Apply 1 drop to eye 2 (two) times daily.  Marland Kitchen PRESCRIPTION MEDICATION Place 1 drop into both eyes 2 (two) times daily. For glucoma  . REFRESH PLUS 0.5 % SOLN Apply 1 drop to eye 2 (  two) times daily.  . rosuvastatin (CRESTOR) 10 MG tablet Take 1 tablet (10 mg total) by mouth daily.  . Semaglutide, [name], (OZEMPIC, [name],) 2 MG/1.5ML SOPN Inject 1 mg into the skin once a week.  . diltiazem (CARDIZEM) 120 MG tablet diltiazem 120 mg tablet  Take 1 tablet 3 times a day by oral route.  . diltiazem (TIAZAC) 300 MG 24 hr capsule Take 300 mg by mouth as directed.  . Insulin Aspart (NOVOLOG [place]) Inject 42-48 Units into the skin 2 (two) times daily. 48 in the am and 42 at night.  . losartan (COZAAR) 50 MG tablet Take 50 mg by mouth as directed.  . pramoxine-hydrocortisone (PRAMOSONE) cream Apply topically 3 (three) times daily. Apply topically to hemorrhoids four times daily as needed after bowel movements, for external use only  . tamsulosin (FLOMAX) 0.4 MG CAPS capsule   . [DISCONTINUED] niacin (NIASPAN) 500 MG CR tablet TAKE 2 TABLETS AT BEDTIME  . [DISCONTINUED] Semaglutide, [name], (OZEMPIC, [name],) 2 MG/1.5ML SOPN Inject 1 mg into the skin once a week.   No facility-administered encounter medications on file as of 05/15/2019.     Activities of Daily Living In your present state of health, do you have any difficulty performing the following activities: 05/15/2019  Hearing? Y  Comment hard of hearing left ear, wears a hearing aide  Vision? Y  Comment blurry  Difficulty concentrating or making decisions? Y  Comment some remembering  Walking or climbing stairs? Y  Dressing or bathing? N  Doing errands, shopping? N  Preparing Food and eating ? N   Using the Toilet? N  In the past six months, have you accidently leaked urine? N  Do you have problems with loss of bowel control? Y  Comment once ina a awhile  Managing your Medications? N  Managing your Finances? N  Housekeeping or managing your Housekeeping? N  Some recent data might be hidden    Patient Care Team: [name], [name] as PCP - General (General Practice)   Assessment:   This is a routine wellness examination for [place].  Exercise Activities and Dietary recommendations Current Exercise Habits: Home exercise routine, Time (Minutes): 10, Frequency (Times/Week): 2, Weekly Exercise (Minutes/Week): 20  Goals    . Patient Stated     05/15/2019, no goals       Fall Risk Fall Risk  05/15/2019 03/21/2019 12/17/2018 09/17/2018 07/06/2018  Falls in the past year? 0 0 0 0 0  Injury with Fall? - - - - 0  Risk for fall due to : Medication side effect;Impaired mobility;Impaired balance/gait - - - -   Is the patient's home free of loose throw rugs in walkways, pet beds, electrical cords, etc?   yes      Grab bars in the bathroom? no      Handrails on the stairs?   n/a      Adequate lighting?   yes  Timed Get Up and Go Performed: n/a  Depression Screen PHQ 2/9 Scores 05/15/2019 03/21/2019 12/17/2018 09/17/2018  PHQ - 2 Score 0 0 0 0  PHQ- 9 Score 3 - - -    Cognitive Function     6CIT Screen 05/15/2019 03/15/2018  What Year? 0 points 0 points  What month? 0 points 0 points  What time? 0 points 0 points  Count back from 20 0 points 0 points  Months in reverse 0 points 0 points  Repeat phrase 2 points 2 points  Total  Score 2 2    Immunization History  Administered Date(s) Administered  . Influenza, High Dose Seasonal PF 03/21/2019  . Pneumococcal Conjugate-13 02/23/2016  . Pneumococcal Polysaccharide-23 05/15/2019  . Tdap 02/04/2013    Qualifies for Shingles Vaccine? yes  Screening Tests Health Maintenance  Topic Date Due  . FOOT EXAM  06/19/2019  . HEMOGLOBIN  A1C  09/19/2019  . OPHTHALMOLOGY EXAM  12/24/2019  . TETANUS/TDAP  02/05/2023  . INFLUENZA VACCINE  Completed  . PNA vac Low Risk Adult  Completed   Cancer Screenings: Lung: Low Dose CT Chest recommended if Age 20-80 years, 30 pack-year currently smoking OR have quit w/in 15years. Patient does not qualify. Colorectal: not required  Additional Screenings:  Hepatitis C Screening:n/a      Plan:    patient has no goals set at this time. Patient is unable to stand so height and weight were not captured.  I have personally reviewed and noted the following in the patient's chart:   . Medical and social history . Use of alcohol, tobacco or illicit drugs  . Current medications and supplements . Functional ability and status . Nutritional status . Physical activity . Advanced directives . List of other physicians . Hospitalizations, surgeries, and ER visits in previous 12 months . Vitals . Screenings to include cognitive, depression, and falls . Referrals and appointments  In addition, I have reviewed and discussed with patient certain preventive protocols, quality metrics, and best practice recommendations. A written personalized care plan for preventive services as well as general preventive health recommendations were provided to patient.     Kellie Simmering, LPN  [id number]/12/2548

## 2019-05-15 NOTE — Patient Instructions (Signed)
Micheal Walls , Thank you for taking time to come for your Medicare Wellness Visit. I appreciate your ongoing commitment to your health goals. Please review the following plan we discussed and let me know if I can assist you in the future.   Screening recommendations/referrals: Colonoscopy: not required Recommended yearly ophthalmology/optometry visit for glaucoma screening and checkup Recommended yearly dental visit for hygiene and checkup  Vaccinations: Influenza vaccine: 03/2019 Pneumococcal vaccine: today Tdap vaccine: 01/2013 Shingles vaccine: discussed    Advanced directives: Advance directive discussed with you today. Even though you declined this today please call our office should you change your mind and we can give you the proper paperwork for you to fill out.   Conditions/risks identified: HTN, non ambulatory  Next appointment: 06/25/2019 at 10:00  Preventive Care 65 Years and Older, Male Preventive care refers to lifestyle choices and visits with your health care provider that can promote health and wellness. What does preventive care include?  A yearly physical exam. This is also called an annual well check.  Dental exams once or twice a year.  Routine eye exams. Ask your health care provider how often you should have your eyes checked.  Personal lifestyle choices, including:  Daily care of your teeth and gums.  Regular physical activity.  Eating a healthy diet.  Avoiding tobacco and drug use.  Limiting alcohol use.  Practicing safe sex.  Taking low doses of aspirin every day.  Taking vitamin and mineral supplements as recommended by your health care provider. What happens during an annual well check? The services and screenings done by your health care provider during your annual well check will depend on your age, overall health, lifestyle risk factors, and family history of disease. Counseling  Your health care provider may ask you questions about your:   Alcohol use.  Tobacco use.  Drug use.  Emotional well-being.  Home and relationship well-being.  Sexual activity.  Eating habits.  History of falls.  Memory and ability to understand (cognition).  Work and work Statistician. Screening  You may have the following tests or measurements:  Height, weight, and BMI.  Blood pressure.  Lipid and cholesterol levels. These may be checked every 5 years, or more frequently if you are over 23 years old.  Skin check.  Lung cancer screening. You may have this screening every year starting at age 52 if you have a 30-pack-year history of smoking and currently smoke or have quit within the past 15 years.  Fecal occult blood test (FOBT) of the stool. You may have this test every year starting at age 62.  Flexible sigmoidoscopy or colonoscopy. You may have a sigmoidoscopy every 5 years or a colonoscopy every 10 years starting at age 75.  Prostate cancer screening. Recommendations will vary depending on your family history and other risks.  Hepatitis C blood test.  Hepatitis B blood test.  Sexually transmitted disease (STD) testing.  Diabetes screening. This is done by checking your blood sugar (glucose) after you have not eaten for a while (fasting). You may have this done every 1-3 years.  Abdominal aortic aneurysm (AAA) screening. You may need this if you are a current or former smoker.  Osteoporosis. You may be screened starting at age 91 if you are at high risk. Talk with your health care provider about your test results, treatment options, and if necessary, the need for more tests. Vaccines  Your health care provider may recommend certain vaccines, such as:  Influenza vaccine. This is  recommended every year.  Tetanus, diphtheria, and acellular pertussis (Tdap, Td) vaccine. You may need a Td booster every 10 years.  Zoster vaccine. You may need this after age 28.  Pneumococcal 13-valent conjugate (PCV13) vaccine. One dose  is recommended after age 78.  Pneumococcal polysaccharide (PPSV23) vaccine. One dose is recommended after age 44. Talk to your health care provider about which screenings and vaccines you need and how often you need them. This information is not intended to replace advice given to you by your health care provider. Make sure you discuss any questions you have with your health care provider. Document Released: 06/26/2015 Document Revised: 02/17/2016 Document Reviewed: 03/31/2015 Elsevier Interactive Patient Education  2017 [place] Prevention in the Home Falls can cause injuries. They can happen to people of all ages. There are many things you can do to make your home safe and to help prevent falls. What can I do on the outside of my home?  Regularly fix the edges of walkways and driveways and fix any cracks.  Remove anything that might make you trip as you walk through a door, such as a raised step or threshold.  Trim any bushes or trees on the path to your home.  Use bright outdoor lighting.  Clear any walking paths of anything that might make someone trip, such as rocks or tools.  Regularly check to see if handrails are loose or broken. Make sure that both sides of any steps have handrails.  Any raised decks and porches should have guardrails on the edges.  Have any leaves, snow, or ice cleared regularly.  Use sand or salt on walking paths during winter.  Clean up any spills in your garage right away. This includes oil or grease spills. What can I do in the bathroom?  Use night lights.  Install grab bars by the toilet and in the tub and shower. Do not use towel bars as grab bars.  Use non-skid mats or decals in the tub or shower.  If you need to sit down in the shower, use a plastic, non-slip stool.  Keep the floor dry. Clean up any water that spills on the floor as soon as it happens.  Remove soap buildup in the tub or shower regularly.  Attach bath mats  securely with double-sided non-slip rug tape.  Do not have throw rugs and other things on the floor that can make you trip. What can I do in the bedroom?  Use night lights.  Make sure that you have a light by your bed that is easy to reach.  Do not use any sheets or blankets that are too big for your bed. They should not hang down onto the floor.  Have a firm chair that has side arms. You can use this for support while you get dressed.  Do not have throw rugs and other things on the floor that can make you trip. What can I do in the kitchen?  Clean up any spills right away.  Avoid walking on wet floors.  Keep items that you use a lot in easy-to-reach places.  If you need to reach something above you, use a strong step stool that has a grab bar.  Keep electrical cords out of the way.  Do not use floor polish or wax that makes floors slippery. If you must use wax, use non-skid floor wax.  Do not have throw rugs and other things on the floor that can make you trip.  What can I do with my stairs?  Do not leave any items on the stairs.  Make sure that there are handrails on both sides of the stairs and use them. Fix handrails that are broken or loose. Make sure that handrails are as long as the stairways.  Check any carpeting to make sure that it is firmly attached to the stairs. Fix any carpet that is loose or worn.  Avoid having throw rugs at the top or bottom of the stairs. If you do have throw rugs, attach them to the floor with carpet tape.  Make sure that you have a light switch at the top of the stairs and the bottom of the stairs. If you do not have them, ask someone to add them for you. What else can I do to help prevent falls?  Wear shoes that:  Do not have high heels.  Have rubber bottoms.  Are comfortable and fit you well.  Are closed at the toe. Do not wear sandals.  If you use a stepladder:  Make sure that it is fully opened. Do not climb a closed  stepladder.  Make sure that both sides of the stepladder are locked into place.  Ask someone to hold it for you, if possible.  Clearly mark and make sure that you can see:  Any grab bars or handrails.  First and last steps.  Where the edge of each step is.  Use tools that help you move around (mobility aids) if they are needed. These include:  Canes.  Walkers.  Scooters.  Crutches.  Turn on the lights when you go into a dark area. Replace any light bulbs as soon as they burn out.  Set up your furniture so you have a clear path. Avoid moving your furniture around.  If any of your floors are uneven, fix them.  If there are any pets around you, be aware of where they are.  Review your medicines with your doctor. Some medicines can make you feel dizzy. This can increase your chance of falling. Ask your doctor what other things that you can do to help prevent falls. This information is not intended to replace advice given to you by your health care provider. Make sure you discuss any questions you have with your health care provider. Document Released: 03/26/2009 Document Revised: 11/05/2015 Document Reviewed: 07/04/2014 Elsevier Interactive Patient Education  2017 [employer].

## 2019-05-29 ENCOUNTER — Other Ambulatory Visit: Payer: Self-pay

## 2019-05-29 DIAGNOSIS — E1159 Type 2 diabetes mellitus with other circulatory complications: Secondary | ICD-10-CM

## 2019-05-29 DIAGNOSIS — Z794 Long term (current) use of insulin: Secondary | ICD-10-CM

## 2019-05-29 MED ORDER — FREESTYLE LIBRE 14 DAY SENSOR MISC: 1.0000 | 3 refills | Status: DC

## 2019-06-25 ENCOUNTER — Other Ambulatory Visit: Payer: Self-pay

## 2019-06-25 ENCOUNTER — Encounter: Payer: Self-pay | Admitting: Nurse Practitioner

## 2019-06-25 ENCOUNTER — Ambulatory Visit (INDEPENDENT_AMBULATORY_CARE_PROVIDER_SITE_OTHER): Payer: Medicare Other | Admitting: Nurse Practitioner

## 2019-06-25 VITALS — BP 130/70 | HR 84 | Temp 98.2°F

## 2019-06-25 DIAGNOSIS — I739 Peripheral vascular disease, unspecified: Secondary | ICD-10-CM

## 2019-06-25 DIAGNOSIS — Z89611 Acquired absence of right leg above knee: Secondary | ICD-10-CM | POA: Diagnosis not present

## 2019-06-25 DIAGNOSIS — E782 Mixed hyperlipidemia: Secondary | ICD-10-CM | POA: Diagnosis not present

## 2019-06-25 DIAGNOSIS — Z6839 Body mass index (BMI) 39.0-39.9, adult: Secondary | ICD-10-CM | POA: Diagnosis not present

## 2019-06-25 DIAGNOSIS — I129 Hypertensive chronic kidney disease with stage 1 through stage 4 chronic kidney disease, or unspecified chronic kidney disease: Secondary | ICD-10-CM

## 2019-06-25 DIAGNOSIS — Z794 Long term (current) use of insulin: Secondary | ICD-10-CM

## 2019-06-25 DIAGNOSIS — I10 Essential (primary) hypertension: Secondary | ICD-10-CM

## 2019-06-25 DIAGNOSIS — G629 Polyneuropathy, unspecified: Secondary | ICD-10-CM

## 2019-06-25 DIAGNOSIS — N184 Chronic kidney disease, stage 4 (severe): Secondary | ICD-10-CM | POA: Diagnosis not present

## 2019-06-25 DIAGNOSIS — E1159 Type 2 diabetes mellitus with other circulatory complications: Secondary | ICD-10-CM | POA: Diagnosis not present

## 2019-06-25 MED ORDER — LOSARTAN POTASSIUM 50 MG PO TABS: 50.0000 mg | ORAL_TABLET | ORAL | 1 refills | Status: DC

## 2019-06-25 MED ORDER — GABAPENTIN 100 MG PO CAPS: 100.0000 mg | ORAL_CAPSULE | Freq: Three times a day (TID) | ORAL | 1 refills | Status: DC

## 2019-06-25 MED ORDER — PANTOPRAZOLE SODIUM 40 MG PO TBEC: 40.0000 mg | DELAYED_RELEASE_TABLET | Freq: Every day | ORAL | 1 refills | Status: DC

## 2019-06-25 NOTE — Progress Notes (Addendum)
Subjective:     Patient ID: Micheal Walls , male    DOB: July 07, 1935 , 84 y.o.   MRN: [id number]   Chief Complaint  Patient presents with  . Diabetes    HPI  Diabetes He presents for his follow-up diabetic visit. He has type 2 diabetes mellitus. His disease course has been improving. Pertinent negatives for hypoglycemia include no confusion, dizziness, headaches or nervousness/anxiousness. Pertinent negatives for diabetes include no blurred vision, no fatigue, no polydipsia, no polyphagia and no polyuria. There are no hypoglycemic complications. Symptoms are improving. There are no diabetic complications. Risk factors for coronary artery disease include sedentary lifestyle and obesity. Current diabetic treatment includes oral agent (monotherapy) (stopped [place] around the Dec 7th.  ). He is compliant with treatment all of the time. He is following a diabetic diet. When asked about meal planning, he reported none. He rarely participates in exercise. His home blood glucose trend is decreasing steadily. (States "it has been good") An ACE inhibitor/angiotensin II receptor blocker is being taken. He does not see a podiatrist.Eye exam is current (Dr. Radene Ou - March 2020).  Hypertension This is a chronic problem. The current episode started more than 1 year ago. The problem is unchanged. The problem is uncontrolled. Associated symptoms include peripheral edema (left lower extremity). Pertinent negatives include no anxiety, blurred vision or headaches. There are no associated agents to hypertension. Risk factors for coronary artery disease include obesity and sedentary lifestyle. Past treatments include ACE inhibitors. There are no compliance problems.  Hypertensive end-organ damage includes kidney disease. There is no history of angina. Identifiable causes of hypertension include chronic renal disease.     Past Medical History:  Diagnosis Date  . Amputated below knee ([place])    right  . Anemia   .  Diastolic heart failure ([place])   . Diverticulosis   . DM (diabetes mellitus) ([place])   . Gout   . Hyperlipemia   . OSA on CPAP   . RBBB   . Systemic hypertension      Family History  Problem Relation Age of Onset  . Diabetes Mother   . Heart attack Father   . Cancer Sister      Current Outpatient Medications:  .  allopurinol (ZYLOPRIM) 100 MG tablet, Take 1 tablet (100 mg total) by mouth daily. (Patient taking differently: Take 100 mg by mouth 2 (two) times daily. ), Disp: 90 tablet, Rfl: 1 .  antiseptic oral rinse (BIOTENE) LIQD, 15 mLs by Mouth Rinse route as needed for dry mouth., Disp: , Rfl:  .  aspirin 81 MG tablet, Take 162 mg by mouth daily., Disp: , Rfl:  .  Castellani Paint Modified 1.5 % LIQD, Use a cotton tip applicator. Paint between toes of left foot as needed once daily., Disp: 1 Bottle, Rfl: 1 .  colchicine 0.6 MG tablet, Take 0.6 mg by mouth as needed (gout flare up). , Disp: , Rfl:  .  Continuous Blood Gluc Receiver (FREESTYLE LIBRE READER) DEVI, 1 each by Does not apply route 4 (four) times daily -  before meals and at bedtime., Disp: 1 Device, Rfl: 0 .  Continuous Blood Gluc Sensor (FREESTYLE LIBRE 14 DAY SENSOR) MISC, 1 each by Does not apply route every 14 (fourteen) days., Disp: 6 each, Rfl: 3 .  diltiazem (CARDIZEM CD) 300 MG 24 hr capsule, TAKE ONE CAPSULE EACH DAY, Disp: 90 capsule, Rfl: 1 .  diltiazem (CARDIZEM) 120 MG tablet, diltiazem 120 mg tablet  Take 1 tablet  3 times a day by oral route., Disp: , Rfl:  .  diltiazem (TIAZAC) 300 MG 24 hr capsule, Take 300 mg by mouth as directed., Disp: , Rfl:  .  dorzolamide (TRUSOPT) 2 % ophthalmic solution, Place 1 drop into both eyes 2 (two) times daily., Disp: , Rfl:  .  ferrous sulfate 325 (65 FE) MG tablet, Take 325 mg by mouth daily with breakfast., Disp: , Rfl:  .  fish oil-omega-3 fatty acids 1000 MG capsule, Take 2 g by mouth daily., Disp: , Rfl:  .  furosemide (LASIX) 40 MG tablet, Take 1 tablet by mouth twice  a day, Disp: 180 tablet, Rfl: 0 .  gabapentin (NEURONTIN) 100 MG capsule, Take 1 capsule (100 mg total) by mouth 3 (three) times daily., Disp: 270 capsule, Rfl: 1 .  hydrocortisone (ANUSOL-HC) 25 MG suppository, Place 25 mg rectally 2 (two) times daily as needed for hemorrhoids or anal itching., Disp: , Rfl:  .  Insulin Aspart (NOVOLOG [place]), Inject 42-48 Units into the skin 2 (two) times daily. 48 in the am and 42 at night., Disp: , Rfl:  .  Insulin Pen Needle (NOVOFINE) 32G X 6 MM MISC, ADMINISTER INSULIN 3 TIMES DAILY BEFORE MEALS AND AT BEDTIME AS DIRECTED, Disp: 300 each, Rfl: 1 .  ketotifen (ALAWAY) 0.025 % ophthalmic solution, Place 1 drop into both eyes 2 (two) times daily., Disp: , Rfl:  .  Lancet Devices (MICROLET NEXT LANCING DEVICE) MISC, by Does not apply route. Check blood sugar 3 times per day, Disp: , Rfl:  .  latanoprost (XALATAN) 0.005 % ophthalmic solution, , Disp: , Rfl:  .  losartan (COZAAR) 100 MG tablet, Take 1 tablet (100 mg total) by mouth daily., Disp: 90 tablet, Rfl: 1 .  losartan (COZAAR) 50 MG tablet, Take 50 mg by mouth as directed., Disp: , Rfl:  .  Multiple Vitamin (MULTIVITAMIN) tablet, Take 1 tablet by mouth daily., Disp: , Rfl:  .  niacin (NIASPAN) 500 MG CR tablet, TAKE 2 TABLETS AT BEDTIME, Disp: 180 tablet, Rfl: 1 .  pantoprazole (PROTONIX) 40 MG tablet, Take 1 tablet (40 mg total) by mouth daily., Disp: 90 tablet, Rfl: 1 .  Polyvinyl Alcohol-Povidone (REFRESH OP), Apply 1 drop to eye 2 (two) times daily., Disp: , Rfl:  .  pramoxine-hydrocortisone (PRAMOSONE) cream, Apply topically 3 (three) times daily. Apply topically to hemorrhoids four times daily as needed after bowel movements, for external use only, Disp: , Rfl:  .  PRESCRIPTION MEDICATION, Place 1 drop into both eyes 2 (two) times daily. For glucoma, Disp: , Rfl:  .  REFRESH PLUS 0.5 % SOLN, Apply 1 drop to eye 2 (two) times daily., Disp: , Rfl:  .  rosuvastatin (CRESTOR) 10 MG tablet, Take 1 tablet (10 mg  total) by mouth daily., Disp: 90 tablet, Rfl: 1 .  Semaglutide, [name], (OZEMPIC, [name],) 2 MG/1.5ML SOPN, Inject 1 mg into the skin once a week., Disp: 6 pen, Rfl: 1 .  tamsulosin (FLOMAX) 0.4 MG CAPS capsule, , Disp: , Rfl:  .  insulin degludec (TRESIBA FLEXTOUCH) 100 UNIT/ML SOPN FlexTouch Pen, Inject 0.1 mLs (10 Units total) into the skin daily. (Patient not taking: Reported on 06/25/2019), Disp: 15 pen, Rfl: 1   No Known Allergies   Review of Systems  Constitutional: Negative.  Negative for fatigue.  Eyes: Negative.  Negative for blurred vision.  Respiratory: Negative.   Cardiovascular: Negative.   Gastrointestinal: Negative.   Endocrine: Negative for polydipsia, polyphagia and  polyuria.  Musculoskeletal: Negative.        Right prosthesis for right above the knee amputation  Skin: Negative.   Neurological: Negative.  Negative for dizziness, syncope, light-headedness and headaches.  Psychiatric/Behavioral: Negative for confusion. The patient is not nervous/anxious.      Today's Vitals   06/25/19 0914  BP: 130/70  Pulse: 84  Temp: 98.2 F (36.8 C)  TempSrc: Oral  PainSc: 0-No pain   There is no height or weight on file to calculate BMI.   Objective:  Physical Exam Constitutional:      General: He is not in acute distress.    Appearance: Normal appearance. He is obese.  Cardiovascular:     Rate and Rhythm: Normal rate and regular rhythm.     Pulses: Normal pulses.     Heart sounds: Normal heart sounds. No murmur.  Pulmonary:     Effort: Pulmonary effort is normal. No respiratory distress.     Breath sounds: Normal breath sounds.  Musculoskeletal:        General: No tenderness.     Left lower leg: Edema present.     Comments: Right above the knee amputation.  Left lower extremity with 1+ edema  Skin:    General: Skin is warm and dry.     Capillary Refill: Capillary refill takes less than 2 seconds.     Findings: No erythema or lesion (healed).  Neurological:      General: No focal deficit present.     Mental Status: He is alert.  Psychiatric:        Mood and Affect: Mood normal.        Behavior: Behavior normal.        Thought Content: Thought content normal.        Judgment: Judgment normal.         Assessment And Plan:     1. Type 2 diabetes mellitus with other circulatory complication, with long-term current use of insulin (HCC)  Chronic, blood sugars are improving controlled  No longer taking tresiba will check HgbA1c. - Lipid panel - CMP14 + Anion Gap - Hemoglobin A1c - TSH  2. Mixed hyperlipidemia  Chronic, controlled  Continue with current medications - Lipid panel - CMP14 + Anion Gap - Hemoglobin A1c - TSH  3. Essential hypertension . B/P is well controlled, slightly elevated today . Encouraged to make sure he is staying well hydrated  . CMP ordered to check renal function.  . The importance of regular exercise and dietary modification was stressed to the patient.   4. PAD (peripheral artery disease) ([place])  Has seen Vascular and the wounds have healed to left lower extremity  4. Stage 4 chronic kidney disease (HCC) Stable, continue with follow up with nephrology  5. PAD (peripheral artery disease) (HCC)  Will check lipid profile  Continue with aspirin daily - Lipid Profile  6. Class 2 severe obesity due to excess calories with serious comorbidity and body mass index (BMI) of 39.0 to 39.9 in adult [facility])  Chronic  Discussed healthy diet and regular exercise options   Encouraged to exercise at least 150 minutes per week with 2 days of strength training  7.Right above knee amputee  Doing well uses motorized wheel chair       [name], [name]    THE PATIENT IS ENCOURAGED TO PRACTICE SOCIAL DISTANCING DUE TO THE COVID-19 PANDEMIC.

## 2019-06-26 LAB — LIPID PANEL
Chol/HDL Ratio: 3.5 ratio (ref 0.0–5.0)
Cholesterol, Total: 123 mg/dL (ref 100–199)
HDL: 35 mg/dL — ABNORMAL LOW (ref >39)
LDL Chol Calc (NIH): 67 mg/dL (ref 0–99)
Triglycerides: 111 mg/dL (ref 0–149)
VLDL Cholesterol Cal: 21 mg/dL (ref 5–40)

## 2019-06-26 LAB — CMP14+EGFR
ALT: 24 IU/L (ref 0–44)
AST: 25 IU/L (ref 0–40)
Albumin/Globulin Ratio: 1.4 (ref 1.2–2.2)
Albumin: 3.8 g/dL (ref 3.6–4.6)
Alkaline Phosphatase: 73 IU/L (ref 39–117)
BUN/Creatinine Ratio: 29 — ABNORMAL HIGH (ref 10–24)
BUN: 56 mg/dL — ABNORMAL HIGH (ref 8–27)
Bilirubin Total: 0.4 mg/dL (ref 0.0–1.2)
CO2: 24 mmol/L (ref 20–29)
Calcium: 10.3 mg/dL — ABNORMAL HIGH (ref 8.6–10.2)
Chloride: 102 mmol/L (ref 96–106)
Creatinine, Ser: 1.91 mg/dL — ABNORMAL HIGH (ref 0.76–1.27)
GFR calc Af Amer: 37 mL/min/{1.73_m2} — ABNORMAL LOW (ref >59)
GFR calc non Af Amer: 32 mL/min/{1.73_m2} — ABNORMAL LOW (ref >59)
Globulin, Total: 2.8 g/dL (ref 1.5–4.5)
Glucose: 145 mg/dL — ABNORMAL HIGH (ref 65–99)
Potassium: 3.8 mmol/L (ref 3.5–5.2)
Sodium: 140 mmol/L (ref 134–144)
Total Protein: 6.6 g/dL (ref 6.0–8.5)

## 2019-06-26 LAB — HEMOGLOBIN A1C
Est. average glucose Bld gHb Est-mCnc: 151 mg/dL
Hgb A1c MFr Bld: 6.9 % — ABNORMAL HIGH (ref 4.8–5.6)

## 2019-07-03 ENCOUNTER — Encounter: Payer: Self-pay | Admitting: Nurse Practitioner

## 2019-07-10 ENCOUNTER — Telehealth: Payer: Self-pay | Admitting: *Deleted

## 2019-07-10 NOTE — Telephone Encounter (Signed)
Called patient to inform him that per CPAP download dated 06/09/20 to 07/09/19 shows his AHI is 5.6.  It is also showing a mask leak. Patient states when asked he uses nasal pillows. When asked if he keeps his facial hair shaved he replied he does not keep his mustache shaved because when he does the nasal mask irritates his nose, patient informed that if he doesn't shave it off completely he will still need to keep it trimmed. He voiced verbal understanding. Pressure changes were made as ordered remotely as ordered by Dr Claiborne Billings.

## 2019-07-15 ENCOUNTER — Encounter: Payer: Self-pay | Admitting: Podiatry

## 2019-07-15 ENCOUNTER — Ambulatory Visit (INDEPENDENT_AMBULATORY_CARE_PROVIDER_SITE_OTHER): Payer: Medicare Other | Admitting: Podiatry

## 2019-07-15 ENCOUNTER — Other Ambulatory Visit: Payer: Self-pay

## 2019-07-15 DIAGNOSIS — E1151 Type 2 diabetes mellitus with diabetic peripheral angiopathy without gangrene: Secondary | ICD-10-CM

## 2019-07-15 DIAGNOSIS — Z89511 Acquired absence of right leg below knee: Secondary | ICD-10-CM

## 2019-07-15 DIAGNOSIS — B351 Tinea unguium: Secondary | ICD-10-CM

## 2019-07-15 NOTE — Progress Notes (Signed)
Subjective: Micheal Walls presents today for follow up of at risk foot care. Patient has h/o below knee amputation right lower extremity.:  No Known Allergies   Objective: There were no vitals filed for this visit.  Vascular Examination:  Nonpalpable pedal pulses left lower extremity, pedal hair absent LLE, skin temperature gradient within normal limits LLE and nonpitting edema noted LLE  Dermatological Examination: Pedal skin is thin shiny, atrophic LLE no open wounds LLE,  no interdigital macerations LLE and toenails 2-5 LLE elongated, dystrophic, thickened, crumbly with subungual debris. There is noted onchyolysis of entire nailplate of the left hallux.  The nailbed remains intact. There is no erythema, no edema, no drainage, no underlying flocculence.  Musculoskeletal: Normal muscle strength 5/5 to all lower extremity muscle groups LLE,  no gross bony deformities LLE, no pain crepitus or joint limitation noted with ROM LLE and there is a  below knee amputation of the RLE.  Shoe Inspection: White leather extra depth shoes without insoles which do not accommodate his edema. He will need new shoes.  Neurological: Protective sensation absent with 10g monofilament LLE and vibratory sensation decreased LLE  Assessment: 1. Onychomycosis   2. Status post below-knee amputation of right lower extremity (Micheal Walls)   3. Type II diabetes mellitus with peripheral circulatory disorder (HCC)      Plan: -Toenails 1-5 LLE were debrided in length and girth without iatrogenic bleeding. -Patient to continue soft, supportive shoe gear daily. Start procedure for diabetic shoes. Patient qualifies based on diagnoses. Measured for shoes on today's visit. Micheal Walls will need to see Micheal Walls for foot examination for certification of his diabetic shoes. Liliane Channel or [employer] for our O+P Dept will call him when his shoes arrive. -Patient to report any pedal injuries to medical professional immediately. -Patient/POA  to call should there be question/concern in the interim.  Return in about 3 months (around 10/12/2019) for diabetic nail trim.

## 2019-07-15 NOTE — Patient Instructions (Signed)
Diabetes Mellitus and Foot Care Foot care is an important part of your health, especially when you have diabetes. Diabetes may cause you to have problems because of poor blood flow (circulation) to your feet and legs, which can cause your skin to:  Become thinner and drier.  Break more easily.  Heal more slowly.  Peel and crack. You may also have nerve damage (neuropathy) in your legs and feet, causing decreased feeling in them. This means that you may not notice minor injuries to your feet that could lead to more serious problems. Noticing and addressing any potential problems early is the best way to prevent future foot problems. How to care for your feet Foot hygiene  Wash your feet daily with warm water and mild soap. Do not use hot water. Then, pat your feet and the areas between your toes until they are completely dry. Do not soak your feet as this can dry your skin.  Trim your toenails straight across. Do not dig under them or around the cuticle. File the edges of your nails with an emery board or nail file.  Apply a moisturizing lotion or petroleum jelly to the skin on your feet and to dry, brittle toenails. Use lotion that does not contain alcohol and is unscented. Do not apply lotion between your toes. Shoes and socks  Wear clean socks or stockings every day. Make sure they are not too tight. Do not wear knee-high stockings since they may decrease blood flow to your legs.  Wear shoes that fit properly and have enough cushioning. Always look in your shoes before you put them on to be sure there are no objects inside.  To break in new shoes, wear them for just a few hours a day. This prevents injuries on your feet. Wounds, scrapes, corns, and calluses  Check your feet daily for blisters, cuts, bruises, sores, and redness. If you cannot see the bottom of your feet, use a mirror or ask someone for help.  Do not cut corns or calluses or try to remove them with medicine.  If you  find a minor scrape, cut, or break in the skin on your feet, keep it and the skin around it clean and dry. You may clean these areas with mild soap and water. Do not clean the area with peroxide, alcohol, or iodine.  If you have a wound, scrape, corn, or callus on your foot, look at it several times a day to make sure it is healing and not infected. Check for: ? Redness, swelling, or pain. ? Fluid or blood. ? Warmth. ? Pus or a bad smell. General instructions  Do not cross your legs. This may decrease blood flow to your feet.  Do not use heating pads or hot water bottles on your feet. They may burn your skin. If you have lost feeling in your feet or legs, you may not know this is happening until it is too late.  Protect your feet from hot and cold by wearing shoes, such as at the beach or on hot pavement.  Schedule a complete foot exam at least once a year (annually) or more often if you have foot problems. If you have foot problems, report any cuts, sores, or bruises to your health care provider immediately. Contact a health care provider if:  You have a medical condition that increases your risk of infection and you have any cuts, sores, or bruises on your feet.  You have an injury that is not   healing.  You have redness on your legs or feet.  You feel burning or tingling in your legs or feet.  You have pain or cramps in your legs and feet.  Your legs or feet are numb.  Your feet always feel cold.  You have pain around a toenail. Get help right away if:  You have a wound, scrape, corn, or callus on your foot and: ? You have pain, swelling, or redness that gets worse. ? You have fluid or blood coming from the wound, scrape, corn, or callus. ? Your wound, scrape, corn, or callus feels warm to the touch. ? You have pus or a bad smell coming from the wound, scrape, corn, or callus. ? You have a fever. ? You have a red line going up your leg. Summary  Check your feet every day  for cuts, sores, red spots, swelling, and blisters.  Moisturize feet and legs daily.  Wear shoes that fit properly and have enough cushioning.  If you have foot problems, report any cuts, sores, or bruises to your health care provider immediately.  Schedule a complete foot exam at least once a year (annually) or more often if you have foot problems. This information is not intended to replace advice given to you by your health care provider. Make sure you discuss any questions you have with your health care provider. Document Revised: 02/20/2019 Document Reviewed: 07/01/2016 [employer] Patient Education  2020 [employer]  

## 2019-07-19 ENCOUNTER — Ambulatory Visit: Payer: Medicare Other | Admitting: Orthotics

## 2019-07-19 ENCOUNTER — Other Ambulatory Visit: Payer: Self-pay

## 2019-07-24 ENCOUNTER — Encounter: Payer: Self-pay | Admitting: Cardiovascular Disease

## 2019-07-24 ENCOUNTER — Ambulatory Visit (INDEPENDENT_AMBULATORY_CARE_PROVIDER_SITE_OTHER): Payer: Medicare Other | Admitting: Cardiovascular Disease

## 2019-07-24 ENCOUNTER — Other Ambulatory Visit: Payer: Self-pay

## 2019-07-24 VITALS — BP 124/68 | HR 90 | Ht 73.0 in | Wt 291.0 lb

## 2019-07-24 DIAGNOSIS — I739 Peripheral vascular disease, unspecified: Secondary | ICD-10-CM | POA: Diagnosis not present

## 2019-07-24 DIAGNOSIS — N1832 Chronic kidney disease, stage 3b: Secondary | ICD-10-CM

## 2019-07-24 DIAGNOSIS — E669 Obesity, unspecified: Secondary | ICD-10-CM

## 2019-07-24 DIAGNOSIS — E785 Hyperlipidemia, unspecified: Secondary | ICD-10-CM

## 2019-07-24 DIAGNOSIS — G4733 Obstructive sleep apnea (adult) (pediatric): Secondary | ICD-10-CM | POA: Diagnosis not present

## 2019-07-24 DIAGNOSIS — I10 Essential (primary) hypertension: Secondary | ICD-10-CM | POA: Diagnosis not present

## 2019-07-24 DIAGNOSIS — Z89511 Acquired absence of right leg below knee: Secondary | ICD-10-CM | POA: Diagnosis not present

## 2019-07-24 DIAGNOSIS — E1169 Type 2 diabetes mellitus with other specified complication: Secondary | ICD-10-CM

## 2019-07-24 NOTE — Patient Instructions (Signed)

## 2019-07-24 NOTE — Progress Notes (Signed)
Patient ID: Micheal Walls, male   DOB: 01-May-1936, 84 y.o.   MRN: [id number]    Cardiology Office Note    Date:  07/31/2019   ID:  Micheal Walls, DOB 1935/07/21, MRN [id number]  PCP:  [name], [name]  Cardiologist:   [name], MD   Follow up PAD and multiple vascular risk factors.   History of Present Illness:  Micheal Walls is a 84 y.o. male with a history of previous stroke, right below the knee amputation for diabetes-related peripheral arterial disease, insulin-requiring type 2 diabetes mellitus, hyperlipidemia, hypertension and chronic kidney disease as well as obstructive sleep apnea on chronic CPAP who returns for routine follow-up.  He is generally doing well and does not have any cardiovascular complaints other than mild swelling in his left ankle.  He does not have angina or dyspnea with his light activity and denies focal neurological events, syncope or palpitations.  He is unaware of ectopy that I hear on today's exam.  He has had an amputation on the right.    He reports 100% compliance with CPAP and he denies daytime hypersomnolence.  Although he does have a prosthesis, he remains very sedentary and is severely obese. He is now seeing Dr. Moshe Cipro at [facility].  A recent creatinine was 1.91, which is actually a significant improvement.  Glycemic control is good with a hemoglobin A1c of 6.9% and his most recent LDL cholesterol was 26, but as always his HDL is low at 35.  He has never had a cardiac catheterization. By noninvasive imaging has no evidence of nuclear perfusion abnormalities and has a normal left ventricular systolic function. Heart failure has not been an issue to date. He has chronic problems with iron deficiency anemia and has previously undergone upper endoscopy, colonoscopy and capsule enteroscopy without a clear source of bleeding other than some degree of right-sided colonic diverticulosis.  His renal dysfunction and anemia have been a  disincentive to perform any angiography based procedures.   Past Medical History:  Diagnosis Date  . Amputated below knee ([place])    right  . Anemia   . Diastolic heart failure ([place])   . Diverticulosis   . DM (diabetes mellitus) ([place])   . Gout   . Hyperlipemia   . OSA on CPAP   . RBBB   . Systemic hypertension     Past Surgical History:  Procedure Laterality Date  . LEG AMPUTATION BELOW KNEE     right  . NM MYOCAR PERF WALL MOTION  09/18/2009   no ischemia    Current Medications: Outpatient Medications Prior to Visit  Medication Sig Dispense Refill  . allopurinol (ZYLOPRIM) 100 MG tablet Take 1 tablet (100 mg total) by mouth daily. (Patient taking differently: Take 100 mg by mouth 2 (two) times daily. ) 90 tablet 1  . antiseptic oral rinse (BIOTENE) LIQD 15 mLs by Mouth Rinse route as needed for dry mouth.    Marland Kitchen aspirin 81 MG tablet Take 162 mg by mouth daily.    [name] Use a cotton tip applicator. Paint between toes of left foot as needed once daily. 1 Bottle 1  . colchicine 0.6 MG tablet Take 0.6 mg by mouth as needed (gout flare up).     . diltiazem (CARDIZEM CD) 300 MG 24 hr capsule TAKE ONE CAPSULE EACH DAY 90 capsule 1  . dorzolamide (TRUSOPT) 2 % ophthalmic solution Place 1 drop into both eyes 2 (two) times  daily.    . ferrous sulfate 325 (65 FE) MG tablet Take 325 mg by mouth daily with breakfast.    . fish oil-omega-3 fatty acids 1000 MG capsule Take 2 g by mouth daily.    . furosemide (LASIX) 40 MG tablet Take 1 tablet by mouth twice a day 180 tablet 0  . gabapentin (NEURONTIN) 100 MG capsule Take 1 capsule (100 mg total) by mouth 3 (three) times daily. 270 capsule 1  . hydrocortisone (ANUSOL-HC) 25 MG suppository Place 25 mg rectally 2 (two) times daily as needed for hemorrhoids or anal itching.    Marland Kitchen ketotifen (ALAWAY) 0.025 % ophthalmic solution Place 1 drop into both eyes 2 (two) times daily.    Marland Kitchen latanoprost (XALATAN) 0.005 %  ophthalmic solution     . losartan (COZAAR) 100 MG tablet Take 1 tablet (100 mg total) by mouth daily. 90 tablet 1  . Multiple Vitamin (MULTIVITAMIN) tablet Take 1 tablet by mouth daily.    . pantoprazole (PROTONIX) 40 MG tablet Take 1 tablet (40 mg total) by mouth daily. 90 tablet 1  . Polyvinyl Alcohol-Povidone (REFRESH OP) Apply 1 drop to eye 2 (two) times daily.    . pramoxine-hydrocortisone (PRAMOSONE) cream Apply topically 3 (three) times daily. Apply topically to hemorrhoids four times daily as needed after bowel movements, for external use only    . PRESCRIPTION MEDICATION Place 1 drop into both eyes 2 (two) times daily. For glucoma    . REFRESH PLUS 0.5 % SOLN Apply 1 drop to eye 2 (two) times daily.    . rosuvastatin (CRESTOR) 10 MG tablet Take 1 tablet (10 mg total) by mouth daily. (Patient taking differently: Take 10 mg by mouth once a week. ) 90 tablet 1  . Semaglutide, [name], (OZEMPIC, [name],) 2 MG/1.5ML SOPN Inject 1 mg into the skin once a week. 6 pen 1  . Continuous Blood Gluc Receiver (FREESTYLE LIBRE READER) DEVI 1 each by Does not apply route 4 (four) times daily -  before meals and at bedtime. 1 Device 0  . Continuous Blood Gluc Sensor (FREESTYLE LIBRE 14 DAY SENSOR) MISC 1 each by Does not apply route every 14 (fourteen) days. 6 each 3  . diltiazem (CARDIZEM) 120 MG tablet diltiazem 120 mg tablet  Take 1 tablet 3 times a day by oral route.    . diltiazem (TIAZAC) 300 MG 24 hr capsule Take 300 mg by mouth as directed.    . Insulin Aspart (NOVOLOG [place]) Inject 42-48 Units into the skin 2 (two) times daily. 48 in the am and 42 at night.    . insulin degludec (TRESIBA FLEXTOUCH) 100 UNIT/ML SOPN FlexTouch Pen Inject 0.1 mLs (10 Units total) into the skin daily. (Patient not taking: Reported on 06/25/2019) 15 pen 1  . Insulin Pen Needle (NOVOFINE) 32G X 6 MM MISC ADMINISTER INSULIN 3 TIMES DAILY BEFORE MEALS AND AT BEDTIME AS DIRECTED 300 each 1  . Lancet Devices (MICROLET  NEXT LANCING DEVICE) MISC by Does not apply route. Check blood sugar 3 times per day    . losartan (COZAAR) 50 MG tablet Take 1 tablet (50 mg total) by mouth as directed. (Patient not taking: Reported on 07/24/2019) 90 tablet 1  . niacin (NIASPAN) 500 MG CR tablet TAKE 2 TABLETS AT BEDTIME 180 tablet 1  . senna (SENOKOT) 8.6 MG tablet Take by mouth.    . tamsulosin (FLOMAX) 0.4 MG CAPS capsule      No facility-administered medications prior to  visit.     Allergies:   Patient has no known allergies.   Social History   Socioeconomic History  . Marital status: Married    Spouse name: Not on file  . Number of children: Not on file  . Years of education: Not on file  . Highest education level: Not on file  Occupational History  . Occupation: retired  Tobacco Use  . Smoking status: Former Smoker    Types: Cigars    Quit date: 06/12/2001    Years since quitting: 18.1  . Smokeless tobacco: Never Used  Substance and Sexual Activity  . Alcohol use: No    Alcohol/week: 0.0 standard drinks  . Drug use: No  . Sexual activity: Not Currently  Other Topics Concern  . Not on file  Social History Narrative  . Not on file   Social Determinants of Health   Financial Resource Strain:   . Difficulty of Paying Living Expenses: Not on file  Food Insecurity:   . Worried About Charity fundraiser in the Last Year: Not on file  . Ran Out of Food in the Last Year: Not on file  Transportation Needs:   . Lack of Transportation (Medical): Not on file  . Lack of Transportation (Non-Medical): Not on file  Physical Activity: Insufficiently Active  . Days of Exercise per Week: 2 days  . Minutes of Exercise per Session: 10 min  Stress:   . Feeling of Stress : Not on file  Social Connections:   . Frequency of Communication with Friends and Family: Not on file  . Frequency of Social Gatherings with Friends and Family: Not on file  . Attends Religious Services: Not on file  . Active Member of Clubs  or Organizations: Not on file  . Attends Archivist Meetings: Not on file  . Marital Status: Not on file     Family History:  The patient's family history includes Cancer in his sister; Diabetes in his mother; Heart attack in his father.   ROS:   Please see the history of present illness.    ROS All other systems are reviewed and are negative.   PHYSICAL EXAM:   VS:  BP 124/68   Pulse 90   Ht $R'6\' 1"'bO$  (1.854 m)   Wt 291 lb (132 kg)   BMI 38.39 kg/m    Recheck blood pressure 124/68, was 144/66 on presentation   General: Alert, oriented x3, no distress, severely obese (if he had not had a BKA he probably be morbidly obese by BMI) Head: no evidence of trauma, PERRL, EOMI, no exophtalmos or lid lag, no myxedema, no xanthelasma; normal ears, nose and oropharynx Neck: normal jugular venous pulsations and no hepatojugular reflux; brisk carotid pulses without delay and no carotid bruits Chest: clear to auscultation, no signs of consolidation by percussion or palpation, normal fremitus, symmetrical and full respiratory excursions Cardiovascular: normal position and quality of the apical impulse, regular rhythm, normal first and widely split second heart sounds, no murmurs, rubs or gallops Abdomen: no tenderness or distention, no masses by palpation, no abnormal pulsatility or arterial bruits, normal bowel sounds, no hepatosplenomegaly Extremities: no clubbing, cyanosis or edema; 2+ radial, ulnar and brachial pulses bilaterally; right BKA, 1+ left ankle edema, thready pulses in the left foot neurological: grossly nonfocal Psych: Normal mood and affect    Wt Readings from Last 3 Encounters:  07/24/19 291 lb (132 kg)  03/21/19 290 lb (131.5 kg)  02/06/19 290 lb (131.5 kg)  Studies/Labs Reviewed:   EKG:  EKG is ordered today.  It shows sinus rhythm with first-degree AV block, right bundle branch block (old).  No acute repolarization abnormalities.  QTc 486 ms  Recent  Labs: Lipid Panel     Component Value Date/Time   CHOL 123 06/25/2019 1012   TRIG 111 06/25/2019 1012   HDL 35 (L) 06/25/2019 1012   CHOLHDL 3.5 06/25/2019 1012   LDLCALC 67 06/25/2019 1012   LABVLDL 21 06/25/2019 1012   BMET    Component Value Date/Time   NA 140 06/25/2019 1012   K 3.8 06/25/2019 1012   CL 102 06/25/2019 1012   CO2 24 06/25/2019 1012   GLUCOSE 145 (H) 06/25/2019 1012   GLUCOSE 126 (H) 09/12/2018 1803   BUN 56 (H) 06/25/2019 1012   CREATININE 1.91 (H) 06/25/2019 1012   CALCIUM 10.3 (H) 06/25/2019 1012   GFRNONAA 32 (L) 06/25/2019 1012   GFRAA 37 (L) 06/25/2019 1012      ASSESSMENT:    1. Essential hypertension   2. OSA (obstructive sleep apnea)   3. Diabetes mellitus type 2 in obese (HCC)   4. Stage 3b chronic kidney disease   5. Severe obesity (BMI 35.0-39.9) with comorbidity ([place])   6. Dyslipidemia (high LDL; low HDL)   7. PAD (peripheral artery disease) ([place])   8. Status post below-knee amputation of right lower extremity (HCC)      PLAN:  In order of problems listed above:  1. HTN: Adequate control, within target less than 130/80 2. OSA: Reports compliance with CPAP and denies daytime hypersomnolence 3. DM: Excellent glycemic control 4. CKD: Followed by Dr. Moshe Cipro.  Improved renal function with a GFR now around 35. 5. Obesity: Borders on morbid obesity.  Very sedentary. 6. HLP: Excellent LDL cholesterol.  Do not expect improvement in HDL without substantial weight loss which would mean a big change in his lifestyle.. 7. PAD s/p R BKA, remains very sedentary although he does have a prosthesis   Medication Adjustments/Labs and Tests Ordered: Current medicines are reviewed at length with the patient today.  Concerns regarding medicines are outlined above.  Medication changes, Labs and Tests ordered today are listed in the Patient Instructions below. Patient Instructions  Medication Instructions:  No changes *If you need a refill on  your cardiac medications before your next appointment, please call your pharmacy*  Lab Work: None ordered If you have labs (blood work) drawn today and your tests are completely normal, you will receive your results only by: Marland Kitchen MyChart Message (if you have MyChart) OR . A paper copy in the mail If you have any lab test that is abnormal or we need to change your treatment, we will call you to review the results.  Testing/Procedures: None ordered  Follow-Up: At [facility], you and your health needs are our priority.  As part of our continuing mission to provide you with exceptional heart care, we have created designated Provider Care Teams.  These Care Teams include your primary Cardiologist (physician) and Advanced Practice Providers (APPs -  Physician Assistants and Nurse Practitioners) who all work together to provide you with the care you need, when you need it.  Your next appointment:   12 month(s)  The format for your next appointment:   In Person  Provider:   [name], MD      Signed, [name], MD  07/31/2019 6:38 PM    [address] [place], [place]  [address] Phone: [phone]; Fax: [phone]

## 2019-07-30 ENCOUNTER — Other Ambulatory Visit: Payer: Self-pay | Admitting: Nurse Practitioner

## 2019-07-30 DIAGNOSIS — E1159 Type 2 diabetes mellitus with other circulatory complications: Secondary | ICD-10-CM

## 2019-07-30 DIAGNOSIS — Z794 Long term (current) use of insulin: Secondary | ICD-10-CM

## 2019-07-30 MED ORDER — FREESTYLE PRECISION NEO TEST VI STRP: ORAL_STRIP | 12 refills | Status: AC

## 2019-07-31 ENCOUNTER — Encounter: Payer: Self-pay | Admitting: Cardiovascular Disease

## 2019-08-05 ENCOUNTER — Telehealth: Payer: Self-pay

## 2019-08-05 NOTE — Telephone Encounter (Signed)
I called patient to notify him his placard form is ready for pick up. Micheal Walls,Micheal Walls

## 2019-08-09 ENCOUNTER — Other Ambulatory Visit: Payer: Self-pay | Admitting: Nurse Practitioner

## 2019-08-31 ENCOUNTER — Ambulatory Visit: Payer: Medicare Other | Attending: Internal Medicine

## 2019-08-31 DIAGNOSIS — Z23 Encounter for immunization: Secondary | ICD-10-CM

## 2019-08-31 NOTE — Progress Notes (Signed)
   Covid-19 Vaccination Clinic  Name:  [name]    MRN: [id number] DOB: 1935/11/16  08/31/2019  Micheal Walls was observed post Covid-19 immunization for 15 minutes without incident. He was provided with Vaccine Information Sheet and instruction to access the V-Safe system.   Micheal Walls was instructed to call 911 with any severe reactions post vaccine: Marland Kitchen Difficulty breathing  . Swelling of face and throat  . A fast heartbeat  . A bad rash all over body  . Dizziness and weakness   Immunizations Administered    Name Date Dose VIS Date Route   Pfizer COVID-19 Vaccine 08/31/2019 12:30 PM 0.3 mL 05/24/2019 Intramuscular   Manufacturer: [place]   Lot: [id number]   [place]: [address]-2

## 2019-09-02 DIAGNOSIS — H401132 Primary open-angle glaucoma, bilateral, moderate stage: Secondary | ICD-10-CM | POA: Diagnosis not present

## 2019-09-09 DIAGNOSIS — E118 Type 2 diabetes mellitus with unspecified complications: Secondary | ICD-10-CM | POA: Diagnosis not present

## 2019-09-09 DIAGNOSIS — M109 Gout, unspecified: Secondary | ICD-10-CM | POA: Diagnosis not present

## 2019-09-09 DIAGNOSIS — I129 Hypertensive chronic kidney disease with stage 1 through stage 4 chronic kidney disease, or unspecified chronic kidney disease: Secondary | ICD-10-CM | POA: Diagnosis not present

## 2019-09-09 DIAGNOSIS — Z6841 Body Mass Index (BMI) 40.0 and over, adult: Secondary | ICD-10-CM | POA: Diagnosis not present

## 2019-09-09 DIAGNOSIS — N183 Chronic kidney disease, stage 3 unspecified: Secondary | ICD-10-CM | POA: Diagnosis not present

## 2019-09-09 DIAGNOSIS — N3289 Other specified disorders of bladder: Secondary | ICD-10-CM | POA: Diagnosis not present

## 2019-09-09 DIAGNOSIS — E785 Hyperlipidemia, unspecified: Secondary | ICD-10-CM | POA: Diagnosis not present

## 2019-09-24 ENCOUNTER — Other Ambulatory Visit: Payer: Self-pay

## 2019-09-24 ENCOUNTER — Ambulatory Visit (INDEPENDENT_AMBULATORY_CARE_PROVIDER_SITE_OTHER): Payer: Medicare Other | Admitting: Nurse Practitioner

## 2019-09-24 ENCOUNTER — Encounter: Payer: Self-pay | Admitting: Nurse Practitioner

## 2019-09-24 VITALS — BP 140/82 | HR 86 | Temp 97.7°F | Wt 285.0 lb

## 2019-09-24 DIAGNOSIS — I10 Essential (primary) hypertension: Secondary | ICD-10-CM

## 2019-09-24 DIAGNOSIS — E1159 Type 2 diabetes mellitus with other circulatory complications: Secondary | ICD-10-CM

## 2019-09-24 DIAGNOSIS — E782 Mixed hyperlipidemia: Secondary | ICD-10-CM | POA: Diagnosis not present

## 2019-09-24 DIAGNOSIS — Z794 Long term (current) use of insulin: Secondary | ICD-10-CM | POA: Diagnosis not present

## 2019-09-24 DIAGNOSIS — N184 Chronic kidney disease, stage 4 (severe): Secondary | ICD-10-CM | POA: Diagnosis not present

## 2019-09-24 MED ORDER — OZEMPIC (1 MG/DOSE) 2 MG/1.5ML ~~LOC~~ SOPN: 1.0000 mg | PEN_INJECTOR | SUBCUTANEOUS | 1 refills | Status: DC

## 2019-09-24 NOTE — Progress Notes (Addendum)
Subjective:     Patient ID: Micheal Walls , male    DOB: 1935-07-28 , 84 y.o.   MRN: [id number]   Chief Complaint  Patient presents with  . Diabetes  . Hypertension    HPI  He will get his second covid vaccine tomorrow.  Diabetes He presents for his follow-up diabetic visit. He has type 2 diabetes mellitus. His disease course has been improving. Pertinent negatives for hypoglycemia include no confusion, dizziness, headaches or nervousness/anxiousness. Pertinent negatives for diabetes include no blurred vision, no fatigue, no polydipsia, no polyphagia and no polyuria. There are no hypoglycemic complications. Symptoms are improving. There are no diabetic complications. Risk factors for coronary artery disease include sedentary lifestyle and obesity. Current diabetic treatment includes oral agent (monotherapy) (no longer on Tresiba since December). He is compliant with treatment all of the time. He is following a diabetic diet. When asked about meal planning, he reported none. He has not had a previous visit with a dietitian. He rarely participates in exercise. His home blood glucose trend is decreasing steadily. (Blood sugars range 119-178) An ACE inhibitor/angiotensin II receptor blocker is being taken. He sees a podiatrist (Dr Elisha Ponder).Eye exam is current (Dr. Radene Ou - March 2020).  Hypertension This is a chronic problem. The current episode started more than 1 year ago. The problem is unchanged. The problem is uncontrolled. Associated symptoms include peripheral edema (left lower extremity). Pertinent negatives include no anxiety, blurred vision or headaches. There are no associated agents to hypertension. Risk factors for coronary artery disease include obesity and sedentary lifestyle. Past treatments include ACE inhibitors. There are no compliance problems.  Hypertensive end-organ damage includes kidney disease. There is no history of angina. Identifiable causes of hypertension include  chronic renal disease.     Past Medical History:  Diagnosis Date  . Amputated below knee ([place])    right  . Anemia   . Diastolic heart failure ([place])   . Diverticulosis   . DM (diabetes mellitus) ([place])   . Gout   . Hyperlipemia   . OSA on CPAP   . RBBB   . Systemic hypertension      Family History  Problem Relation Age of Onset  . Diabetes Mother   . Heart attack Father   . Cancer Sister      Current Outpatient Medications:  .  allopurinol (ZYLOPRIM) 100 MG tablet, Take 1 tablet (100 mg total) by mouth daily. (Patient taking differently: Take 100 mg by mouth 2 (two) times daily. ), Disp: 90 tablet, Rfl: 1 .  antiseptic oral rinse (BIOTENE) LIQD, 15 mLs by Mouth Rinse route as needed for dry mouth., Disp: , Rfl:  .  aspirin 81 MG tablet, Take 162 mg by mouth daily., Disp: , Rfl:  .  [place] Paint Modified 1.5 % LIQD, Use a cotton tip applicator. Paint between toes of left foot as needed once daily., Disp: 1 Bottle, Rfl: 1 .  colchicine 0.6 MG tablet, Take 0.6 mg by mouth as needed (gout flare up). , Disp: , Rfl:  .  diltiazem (CARDIZEM CD) 300 MG 24 hr capsule, TAKE ONE CAPSULE EACH DAY, Disp: 90 capsule, Rfl: 1 .  ferrous sulfate 325 (65 FE) MG tablet, Take 325 mg by mouth daily with breakfast., Disp: , Rfl:  .  fish oil-omega-3 fatty acids 1000 MG capsule, Take 2 g by mouth daily., Disp: , Rfl:  .  furosemide (LASIX) 40 MG tablet, Take 1 tablet by mouth twice a day, Disp: 180  tablet, Rfl: 0 .  gabapentin (NEURONTIN) 100 MG capsule, Take 1 capsule (100 mg total) by mouth 3 (three) times daily., Disp: 270 capsule, Rfl: 1 .  glucose blood (FREESTYLE PRECISION NEO TEST) test strip, Use as instructed, Disp: 100 each, Rfl: 12 .  insulin degludec (TRESIBA FLEXTOUCH) 100 UNIT/ML FlexTouch Pen, Inject 10 Units into the skin at bedtime., Disp: , Rfl:  .  ketotifen (ALAWAY) 0.025 % ophthalmic solution, Place 1 drop into both eyes 2 (two) times daily., Disp: , Rfl:  .  latanoprost  (XALATAN) 0.005 % ophthalmic solution, , Disp: , Rfl:  .  losartan (COZAAR) 100 MG tablet, Take 1 tablet (100 mg total) by mouth daily., Disp: 90 tablet, Rfl: 1 .  Multiple Vitamin (MULTIVITAMIN) tablet, Take 1 tablet by mouth daily., Disp: , Rfl:  .  pantoprazole (PROTONIX) 40 MG tablet, Take 1 tablet (40 mg total) by mouth daily., Disp: 90 tablet, Rfl: 1 .  Polyvinyl Alcohol-Povidone (REFRESH OP), Apply 1 drop to eye 2 (two) times daily., Disp: , Rfl:  .  PRESCRIPTION MEDICATION, Place 1 drop into both eyes 2 (two) times daily. For glucoma, Disp: , Rfl:  .  REFRESH PLUS 0.5 % SOLN, Apply 1 drop to eye 2 (two) times daily., Disp: , Rfl:  .  rosuvastatin (CRESTOR) 10 MG tablet, Take 1 tablet (10 mg total) by mouth daily. (Patient taking differently: Take 10 mg by mouth once a week. ), Disp: 90 tablet, Rfl: 1 .  Semaglutide, [name], (OZEMPIC, [name],) 2 MG/1.5ML SOPN, Inject 1 mg into the skin once a week., Disp: 6 pen, Rfl: 1 .  dorzolamide (TRUSOPT) 2 % ophthalmic solution, Place 1 drop into both eyes 2 (two) times daily., Disp: , Rfl:  .  hydrocortisone (ANUSOL-HC) 25 MG suppository, Place 25 mg rectally 2 (two) times daily as needed for hemorrhoids or anal itching., Disp: , Rfl:  .  pramoxine-hydrocortisone (PRAMOSONE) cream, Apply topically 3 (three) times daily. Apply topically to hemorrhoids four times daily as needed after bowel movements, for external use only, Disp: , Rfl:    No Known Allergies   Review of Systems  Constitutional: Negative.  Negative for fatigue.  Eyes: Negative.  Negative for blurred vision.  Respiratory: Negative.   Cardiovascular: Negative.   Gastrointestinal: Negative.   Endocrine: Negative for polydipsia, polyphagia and polyuria.  Musculoskeletal: Negative.        Right prosthesis for right above the knee amputation  Uses motorized chair  Skin: Negative.   Neurological: Negative.  Negative for dizziness, syncope, light-headedness and headaches.   Psychiatric/Behavioral: Negative for confusion. The patient is not nervous/anxious.      Today's Vitals   09/24/19 0846  BP: 140/82  Pulse: 86  Temp: 97.7 F (36.5 C)  TempSrc: Oral  Weight: 285 lb (129.3 kg)  PainSc: 0-No pain   Body mass index is 37.6 kg/m.   Objective:  Physical Exam Constitutional:      General: He is not in acute distress.    Appearance: Normal appearance. He is obese.  Cardiovascular:     Rate and Rhythm: Normal rate and regular rhythm.     Pulses: Normal pulses.     Heart sounds: Normal heart sounds. No murmur.  Pulmonary:     Effort: Pulmonary effort is normal. No respiratory distress.     Breath sounds: Normal breath sounds.  Musculoskeletal:     Left lower leg: Edema (1+ left lower extremity) present.     Comments: Right above the  knee amputation.  Left lower extremity with 1+ edema  Skin:    General: Skin is warm and dry.     Capillary Refill: Capillary refill takes less than 2 seconds.     Findings: No erythema (medial left toe appears to be from pressure) or lesion.  Neurological:     General: No focal deficit present.     Mental Status: He is alert and oriented to person, place, and time.     Cranial Nerves: No cranial nerve deficit.  Psychiatric:        Mood and Affect: Mood normal.        Behavior: Behavior normal.        Thought Content: Thought content normal.        Judgment: Judgment normal.       Assessment And Plan:     1. Type 2 diabetes mellitus with other circulatory complication, with long-term current use of insulin (HCC)  Chronic, doing well  Diabetic foot exam done decreased sensation to left foot with monofilament. He has a right BKA. - Lipid panel - CMP14 + Anion Gap - Hemoglobin A1c - TSH  2. Mixed hyperlipidemia  Chronic, controlled  Continue with current medications - Lipid panel - CMP14 + Anion Gap - Hemoglobin A1c - TSH  3. Essential hypertension . B/P is well controlled, slightly elevated  today . Encouraged to make sure he is staying well hydrated  . CMP ordered to check renal function.   4. Stage 4 chronic kidney disease ([place])  Stable, continue with follow up with nephrology      [name], [name]    THE PATIENT IS ENCOURAGED TO PRACTICE SOCIAL DISTANCING DUE TO THE COVID-19 PANDEMIC.

## 2019-09-25 ENCOUNTER — Ambulatory Visit: Payer: Medicare Other | Attending: Internal Medicine

## 2019-09-25 DIAGNOSIS — Z23 Encounter for immunization: Secondary | ICD-10-CM

## 2019-09-25 LAB — BMP8+EGFR
BUN/Creatinine Ratio: 21 (ref 10–24)
BUN: 52 mg/dL — ABNORMAL HIGH (ref 8–27)
CO2: 21 mmol/L (ref 20–29)
Calcium: 10.3 mg/dL — ABNORMAL HIGH (ref 8.6–10.2)
Chloride: 105 mmol/L (ref 96–106)
Creatinine, Ser: 2.46 mg/dL — ABNORMAL HIGH (ref 0.76–1.27)
GFR calc Af Amer: 27 mL/min/{1.73_m2} — ABNORMAL LOW (ref >59)
GFR calc non Af Amer: 24 mL/min/{1.73_m2} — ABNORMAL LOW (ref >59)
Glucose: 142 mg/dL — ABNORMAL HIGH (ref 65–99)
Potassium: 4.5 mmol/L (ref 3.5–5.2)
Sodium: 142 mmol/L (ref 134–144)

## 2019-09-25 LAB — HEMOGLOBIN A1C
Est. average glucose Bld gHb Est-mCnc: 151 mg/dL
Hgb A1c MFr Bld: 6.9 % — ABNORMAL HIGH (ref 4.8–5.6)

## 2019-09-25 LAB — LIPID PANEL
Chol/HDL Ratio: 3.9 ratio (ref 0.0–5.0)
Cholesterol, Total: 138 mg/dL (ref 100–199)
HDL: 35 mg/dL — ABNORMAL LOW (ref >39)
LDL Chol Calc (NIH): 85 mg/dL (ref 0–99)
Triglycerides: 96 mg/dL (ref 0–149)
VLDL Cholesterol Cal: 18 mg/dL (ref 5–40)

## 2019-09-25 NOTE — Progress Notes (Signed)
   Covid-19 Vaccination Clinic  Name:  KAIEN PEZZULLO    MRN: [id number] DOB: 1935-11-20  09/25/2019  Mr. Dearmas was observed post Covid-19 immunization for 15 minutes without incident. He was provided with Vaccine Information Sheet and instruction to access the V-Safe system. This Probation officer called patient to verify that he was ok following his vaccine, he understands S&S of adverse reactions.  Mr. Dralle was instructed to call 911 with any severe reactions post vaccine: Marland Kitchen Difficulty breathing  . Swelling of face and throat  . A fast heartbeat  . A bad rash all over body  . Dizziness and weakness   Immunizations Administered    Name Date Dose VIS Date Route   Pfizer COVID-19 Vaccine 09/25/2019  3:45 PM 0.3 mL 05/24/2019 Intramuscular   Manufacturer: [employer]   Lot: [id number]   [place]: [id number]

## 2019-10-08 NOTE — Progress Notes (Signed)
Does he have a kidney specialist?

## 2019-10-14 ENCOUNTER — Ambulatory Visit (INDEPENDENT_AMBULATORY_CARE_PROVIDER_SITE_OTHER): Payer: Medicare Other | Admitting: Podiatry

## 2019-10-14 ENCOUNTER — Other Ambulatory Visit: Payer: Self-pay

## 2019-10-14 ENCOUNTER — Encounter: Payer: Self-pay | Admitting: Podiatry

## 2019-10-14 VITALS — Temp 97.8°F

## 2019-10-14 DIAGNOSIS — E1151 Type 2 diabetes mellitus with diabetic peripheral angiopathy without gangrene: Secondary | ICD-10-CM | POA: Diagnosis not present

## 2019-10-14 DIAGNOSIS — B351 Tinea unguium: Secondary | ICD-10-CM

## 2019-10-14 DIAGNOSIS — T148XXA Other injury of unspecified body region, initial encounter: Secondary | ICD-10-CM | POA: Diagnosis not present

## 2019-10-14 DIAGNOSIS — Z89511 Acquired absence of right leg below knee: Secondary | ICD-10-CM | POA: Diagnosis not present

## 2019-10-14 NOTE — Progress Notes (Signed)
Subjective: Micheal Walls presents today for follow up of at risk foot care. 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 Pain interferes with ambulation. Aggravating factors include wearing enclosed shoe gear. Pain is relieved with periodic professional debridement.   Pt is inquiring about the status of his diabetic shoes.   No Known Allergies   Objective: Vitals:   10/14/19 0939  Temp: 97.8 F (36.6 C)    Pt is a pleasant 84 y.o. year old AA male morbidly obese in NAD. AAO x 3.   Vascular Examination:  Capillary refill time to remaining digits <3 seconds. DP pulse nonpalpable left foot. PT pulse nonpalpable left foot. Pedal hair absent LLE. Skin temperature gradient WNL LLE. Unilateral edema left LE.  Dermatological Examination: Pedal skin with normal turgor, texture and tone bilaterally. No open wounds bilaterally. No interdigital macerations bilaterally. Toenails L 2nd toe, L 3rd toe, L 4th toe and L 5th toe elongated, dystrophic, thickened, and crumbly with subungual debris and tenderness to dorsal palpation. Healing blood blister noted at IPJ of left hallux. No surrounding erythema, no edema, no drainage, no flocculence. Left hallux nailplate adequate length.     Musculoskeletal: Normal muscle strength 5/5 to all lower extremity muscle groups of LLE. Hammertoes noted to the L hallux, L 2nd toe, L 3rd toe, L 4th toe and L 5th toe. Utilizes motorized chair for mobility assistance. BKA RLE.  Neurological: Protective sensation diminished with 10 gram monofilament left foot. Vibratory sensation diminished left foot.  Assessment: 1. Onychomycosis   2. Blood blister   3. Status post below knee amputation, right ([place])   4. Type II diabetes mellitus with peripheral circulatory disorder (HCC)    Plan: -Continue diabetic foot care principles. Literature dispensed on today.  -Toenails L 2nd  toe, L 3rd toe, L 4th toe and L 5th toe debrided in length and girth without iatrogenic bleeding with sterile nail nipper and dremel.  -Patient instructed to protect blister area with band-aid daily. Remove when bathing/showering. Call office for immediate appointment if he notices any adverse changes in digit such as redness, swelling, drainage, pus.  -Patient to continue soft, supportive shoe gear daily. -Patient to report any pedal injuries to medical professional immediately. -Patient/POA to call should there be question/concern in the interim.  Return in about 3 months (around 01/14/2020) for diabetic nail trim.  [name], DPM

## 2019-10-14 NOTE — Patient Instructions (Signed)
Diabetes Mellitus and Foot Care Foot care is an important part of your health, especially when you have diabetes. Diabetes may cause you to have problems because of poor blood flow (circulation) to your feet and legs, which can cause your skin to:  Become thinner and drier.  Break more easily.  Heal more slowly.  Peel and crack. You may also have nerve damage (neuropathy) in your legs and feet, causing decreased feeling in them. This means that you may not notice minor injuries to your feet that could lead to more serious problems. Noticing and addressing any potential problems early is the best way to prevent future foot problems. How to care for your feet Foot hygiene  Wash your feet daily with warm water and mild soap. Do not use hot water. Then, pat your feet and the areas between your toes until they are completely dry. Do not soak your feet as this can dry your skin.  Trim your toenails straight across. Do not dig under them or around the cuticle. File the edges of your nails with an emery board or nail file.  Apply a moisturizing lotion or petroleum jelly to the skin on your feet and to dry, brittle toenails. Use lotion that does not contain alcohol and is unscented. Do not apply lotion between your toes. Shoes and socks  Wear clean socks or stockings every day. Make sure they are not too tight. Do not wear knee-high stockings since they may decrease blood flow to your legs.  Wear shoes that fit properly and have enough cushioning. Always look in your shoes before you put them on to be sure there are no objects inside.  To break in new shoes, wear them for just a few hours a day. This prevents injuries on your feet. Wounds, scrapes, corns, and calluses  Check your feet daily for blisters, cuts, bruises, sores, and redness. If you cannot see the bottom of your feet, use a mirror or ask someone for help.  Do not cut corns or calluses or try to remove them with medicine.  If you  find a minor scrape, cut, or break in the skin on your feet, keep it and the skin around it clean and dry. You may clean these areas with mild soap and water. Do not clean the area with peroxide, alcohol, or iodine.  If you have a wound, scrape, corn, or callus on your foot, look at it several times a day to make sure it is healing and not infected. Check for: ? Redness, swelling, or pain. ? Fluid or blood. ? Warmth. ? Pus or a bad smell. General instructions  Do not cross your legs. This may decrease blood flow to your feet.  Do not use heating pads or hot water bottles on your feet. They may burn your skin. If you have lost feeling in your feet or legs, you may not know this is happening until it is too late.  Protect your feet from hot and cold by wearing shoes, such as at the beach or on hot pavement.  Schedule a complete foot exam at least once a year (annually) or more often if you have foot problems. If you have foot problems, report any cuts, sores, or bruises to your health care provider immediately. Contact a health care provider if:  You have a medical condition that increases your risk of infection and you have any cuts, sores, or bruises on your feet.  You have an injury that is not  healing.  You have redness on your legs or feet.  You feel burning or tingling in your legs or feet.  You have pain or cramps in your legs and feet.  Your legs or feet are numb.  Your feet always feel cold.  You have pain around a toenail. Get help right away if:  You have a wound, scrape, corn, or callus on your foot and: ? You have pain, swelling, or redness that gets worse. ? You have fluid or blood coming from the wound, scrape, corn, or callus. ? Your wound, scrape, corn, or callus feels warm to the touch. ? You have pus or a bad smell coming from the wound, scrape, corn, or callus. ? You have a fever. ? You have a red line going up your leg. Summary  Check your feet every day  for cuts, sores, red spots, swelling, and blisters.  Moisturize feet and legs daily.  Wear shoes that fit properly and have enough cushioning.  If you have foot problems, report any cuts, sores, or bruises to your health care provider immediately.  Schedule a complete foot exam at least once a year (annually) or more often if you have foot problems. This information is not intended to replace advice given to you by your health care provider. Make sure you discuss any questions you have with your health care provider. Document Revised: 02/20/2019 Document Reviewed: 07/01/2016 Elsevier Patient Education  [employer].  Peripheral Vascular Disease Peripheral vascular disease (PVD) is a disease of the blood vessels. A simple term for PVD is poor circulation. In most cases, PVD narrows the blood vessels that carry blood from your heart to the rest of your body. This can result in a decreased supply of blood to your arms, legs, and internal organs, like your stomach or kidneys. However, it most often affects a person's lower legs and feet. There are two types of PVD.  Organic PVD. This is the more common type. It is caused by damage to the structure of blood vessels.  Functional PVD. This is caused by conditions that make blood vessels contract and tighten (spasm). Without treatment, PVD tends to get worse over time. PVD can also lead to acute limb ischemia. This is when an arm or leg suddenly has trouble getting enough blood. This is a medical emergency. What are the causes?  Each type of PVD has many different causes. The most common cause of PVD is buildup of a fatty material (plaque) inside your arteries (atherosclerosis). Small amounts of plaque can break off from the walls of the blood vessels and become lodged in a smaller artery. This blocks blood flow and can cause acute limb ischemia. Other common causes of PVD include:  Blood clots that form inside of blood vessels.  Injuries  to blood vessels.  Diseases that cause inflammation of blood vessels or cause blood vessel spasms.  Health behaviors and health history that increase your risk of developing PVD. What increases the risk? You are more likely to develop this condition if:  You have a family history of PVD.  You have certain medical conditions, including: ? High cholesterol. ? Diabetes. ? High blood pressure (hypertension). ? Coronary heart disease. ? Past problems with blood clots. ? Past injury, such as burns or a broken bone. These may have damaged blood vessels in your limbs. ? Buerger disease. This is caused by inflamed blood vessels in your hands and feet. ? Some forms of arthritis. ? Rare birth defects  that affect the arteries in your legs. ? Kidney disease.  You use tobacco or smoke.  You do not get enough exercise.  You are obese.  You are age 82 or older. What are the signs or symptoms? This condition may cause different symptoms. Your symptoms depend on what part of your body is not getting enough blood. Some common signs and symptoms include:  Cramps in your lower legs. This may be a symptom of poor leg circulation (claudication).  Pain and weakness in your legs. This happens while you are physically active but goes away when you rest (intermittent claudication).  Leg pain when at rest.  Leg numbness, tingling, or weakness.  Coldness in a leg or foot, especially when compared with the other leg.  Skin or hair changes. These can include: ? Hair loss. ? Shiny skin. ? Pale or bluish skin. ? Thick toenails.  Inability to get or maintain an erection (erectile dysfunction).  Fatigue. People with PVD are more likely to develop ulcers and sores on their toes, feet, or legs. These may take longer than normal to heal. How is this diagnosed? This condition is diagnosed based on:  Your signs and symptoms.  A physical exam and your medical history.  Other tests to find out what  is causing your PVD and to determine its severity. Tests may include: ? Blood pressure recordings from your arms and legs and measurements of the strength of your pulses (pulse volume recordings). ? Imaging studies using sound waves to take pictures of the blood flow through your blood vessels (Doppler ultrasound). ? Injecting a dye into your blood vessels before having imaging studies using:  X-rays (angiogram or arteriogram).  Computer-generated X-rays (CT angiogram).  A powerful electromagnetic field and a computer (magnetic resonance angiogram or MRA). How is this treated? Treatment for PVD depends on the cause of your condition and how severe your symptoms are. It also depends on your age. Underlying causes need to be treated and controlled. These include long-term (chronic) conditions, such as diabetes, high cholesterol, and high blood pressure. Treatment includes:  Lifestyle changes, such as: ? Quitting smoking. ? Exercising regularly. ? Following a low-fat, low-cholesterol diet.  Taking medicines, such as: ? Blood thinners to prevent blood clots. ? Medicines to improve blood flow. ? Medicines to improve your blood cholesterol levels.  Surgical procedures, such as: ? A procedure that uses an inflated balloon to open a blocked artery and improve blood flow (angioplasty). ? A procedure to put in a wire mesh tube to keep a blocked artery open (stent implant). ? Surgery to reroute blood flow around a blocked artery (peripheral bypass surgery). ? Surgery to remove dead tissue from an infected wound on the affected limb. ? Amputation. This is surgical removal of the affected limb. It may be necessary in cases of acute limb ischemia where there has been no improvement through medical or surgical treatments. Follow these instructions at home: Lifestyle  Do not use any products that contain nicotine or tobacco, such as cigarettes and e-cigarettes. If you need help quitting, ask your  health care provider.  Lose weight if you are overweight, and maintain a healthy weight as discussed by your health care provider.  Eat a diet that is low in fat and cholesterol. If you need help, ask your health care provider.  Exercise regularly. Ask your health care provider to suggest some good activities for you. General instructions  Take over-the-counter and prescription medicines only as told by your health  care provider.  Take good care of your feet: ? Wear comfortable shoes that fit well. ? Check your feet often for any cuts or sores.  Keep all follow-up visits as told by your health care provider. This is important. Contact a health care provider if:  You have cramps in your legs while walking.  You have leg pain when you are at rest.  You have coldness in a leg or foot.  Your skin changes.  You have erectile dysfunction.  You have cuts or sores on your feet that are not healing. Get help right away if:  Your arm or leg turns cold, numb, and blue.  Your arms or legs become red, warm, swollen, painful, or numb.  You have chest pain or trouble breathing.  You suddenly have weakness in your face, arm, or leg.  You become very confused or lose the ability to speak.  You suddenly have a very bad headache or lose your vision. Summary  Peripheral vascular disease (PVD) is a disease of the blood vessels.  In most cases, PVD narrows the blood vessels that carry blood from your heart to the rest of your body.  PVD may cause different symptoms. Your symptoms depend on what part of your body is not getting enough blood.  Treatment for PVD depends on the cause of your condition and how severe your symptoms are. This information is not intended to replace advice given to you by your health care provider. Make sure you discuss any questions you have with your health care provider. Document Revised: 05/12/2017 Document Reviewed: 07/07/2016 Elsevier Patient Education   2020 [employer].

## 2019-10-29 ENCOUNTER — Other Ambulatory Visit: Payer: Self-pay | Admitting: Nurse Practitioner

## 2019-10-31 ENCOUNTER — Ambulatory Visit: Payer: Medicare Other | Admitting: Cardiovascular Disease

## 2019-10-31 ENCOUNTER — Other Ambulatory Visit: Payer: Self-pay

## 2019-10-31 ENCOUNTER — Encounter: Payer: Self-pay | Admitting: Cardiovascular Disease

## 2019-11-02 ENCOUNTER — Other Ambulatory Visit: Payer: Self-pay

## 2019-11-02 ENCOUNTER — Emergency Department (HOSPITAL_COMMUNITY): Payer: Medicare Other

## 2019-11-02 ENCOUNTER — Inpatient Hospital Stay (HOSPITAL_COMMUNITY)
Admission: EM | Admit: 2019-11-02 | Discharge: 2019-11-08 | DRG: 871 | Disposition: A | Discharge status: Home / Self Care | Payer: Medicare Other | Attending: Internal Medicine | Admitting: Internal Medicine

## 2019-11-02 ENCOUNTER — Inpatient Hospital Stay (HOSPITAL_COMMUNITY): Payer: Medicare Other

## 2019-11-02 DIAGNOSIS — E669 Obesity, unspecified: Secondary | ICD-10-CM | POA: Diagnosis present

## 2019-11-02 DIAGNOSIS — R531 Weakness: Secondary | ICD-10-CM

## 2019-11-02 DIAGNOSIS — Z20822 Contact with and (suspected) exposure to covid-19: Secondary | ICD-10-CM | POA: Diagnosis present

## 2019-11-02 DIAGNOSIS — Z7401 Bed confinement status: Secondary | ICD-10-CM | POA: Diagnosis not present

## 2019-11-02 DIAGNOSIS — A419 Sepsis, unspecified organism: Secondary | ICD-10-CM | POA: Diagnosis not present

## 2019-11-02 DIAGNOSIS — R6520 Severe sepsis without septic shock: Secondary | ICD-10-CM | POA: Diagnosis present

## 2019-11-02 DIAGNOSIS — R000 Tachycardia, unspecified: Secondary | ICD-10-CM | POA: Diagnosis not present

## 2019-11-02 DIAGNOSIS — Z89511 Acquired absence of right leg below knee: Secondary | ICD-10-CM

## 2019-11-02 DIAGNOSIS — L03032 Cellulitis of left toe: Secondary | ICD-10-CM | POA: Diagnosis present

## 2019-11-02 DIAGNOSIS — I4510 Unspecified right bundle-branch block: Secondary | ICD-10-CM | POA: Diagnosis present

## 2019-11-02 DIAGNOSIS — Z809 Family history of malignant neoplasm, unspecified: Secondary | ICD-10-CM | POA: Diagnosis not present

## 2019-11-02 DIAGNOSIS — I5032 Chronic diastolic (congestive) heart failure: Secondary | ICD-10-CM | POA: Diagnosis present

## 2019-11-02 DIAGNOSIS — Z833 Family history of diabetes mellitus: Secondary | ICD-10-CM | POA: Diagnosis not present

## 2019-11-02 DIAGNOSIS — Z6837 Body mass index (BMI) 37.0-37.9, adult: Secondary | ICD-10-CM

## 2019-11-02 DIAGNOSIS — E782 Mixed hyperlipidemia: Secondary | ICD-10-CM | POA: Diagnosis present

## 2019-11-02 DIAGNOSIS — N289 Disorder of kidney and ureter, unspecified: Secondary | ICD-10-CM | POA: Diagnosis not present

## 2019-11-02 DIAGNOSIS — I110 Hypertensive heart disease with heart failure: Secondary | ICD-10-CM | POA: Diagnosis present

## 2019-11-02 DIAGNOSIS — E785 Hyperlipidemia, unspecified: Secondary | ICD-10-CM | POA: Diagnosis present

## 2019-11-02 DIAGNOSIS — E1151 Type 2 diabetes mellitus with diabetic peripheral angiopathy without gangrene: Secondary | ICD-10-CM | POA: Diagnosis present

## 2019-11-02 DIAGNOSIS — J690 Pneumonitis due to inhalation of food and vomit: Secondary | ICD-10-CM | POA: Diagnosis present

## 2019-11-02 DIAGNOSIS — I130 Hypertensive heart and chronic kidney disease with heart failure and stage 1 through stage 4 chronic kidney disease, or unspecified chronic kidney disease: Secondary | ICD-10-CM | POA: Diagnosis present

## 2019-11-02 DIAGNOSIS — E1122 Type 2 diabetes mellitus with diabetic chronic kidney disease: Secondary | ICD-10-CM | POA: Diagnosis present

## 2019-11-02 DIAGNOSIS — N179 Acute kidney failure, unspecified: Secondary | ICD-10-CM | POA: Diagnosis not present

## 2019-11-02 DIAGNOSIS — L0390 Cellulitis, unspecified: Secondary | ICD-10-CM | POA: Diagnosis not present

## 2019-11-02 DIAGNOSIS — I959 Hypotension, unspecified: Secondary | ICD-10-CM | POA: Diagnosis not present

## 2019-11-02 DIAGNOSIS — K5790 Diverticulosis of intestine, part unspecified, without perforation or abscess without bleeding: Secondary | ICD-10-CM | POA: Diagnosis present

## 2019-11-02 DIAGNOSIS — Z8249 Family history of ischemic heart disease and other diseases of the circulatory system: Secondary | ICD-10-CM | POA: Diagnosis not present

## 2019-11-02 DIAGNOSIS — R0902 Hypoxemia: Secondary | ICD-10-CM | POA: Diagnosis not present

## 2019-11-02 DIAGNOSIS — M2550 Pain in unspecified joint: Secondary | ICD-10-CM | POA: Diagnosis not present

## 2019-11-02 DIAGNOSIS — M19072 Primary osteoarthritis, left ankle and foot: Secondary | ICD-10-CM | POA: Diagnosis not present

## 2019-11-02 DIAGNOSIS — W19XXXA Unspecified fall, initial encounter: Secondary | ICD-10-CM | POA: Diagnosis not present

## 2019-11-02 DIAGNOSIS — E872 Acidosis: Secondary | ICD-10-CM | POA: Diagnosis present

## 2019-11-02 DIAGNOSIS — N1832 Chronic kidney disease, stage 3b: Secondary | ICD-10-CM | POA: Diagnosis present

## 2019-11-02 DIAGNOSIS — Z794 Long term (current) use of insulin: Secondary | ICD-10-CM

## 2019-11-02 DIAGNOSIS — Z87891 Personal history of nicotine dependence: Secondary | ICD-10-CM

## 2019-11-02 DIAGNOSIS — R918 Other nonspecific abnormal finding of lung field: Secondary | ICD-10-CM | POA: Diagnosis not present

## 2019-11-02 DIAGNOSIS — J189 Pneumonia, unspecified organism: Secondary | ICD-10-CM | POA: Diagnosis not present

## 2019-11-02 DIAGNOSIS — Z7982 Long term (current) use of aspirin: Secondary | ICD-10-CM

## 2019-11-02 DIAGNOSIS — G4733 Obstructive sleep apnea (adult) (pediatric): Secondary | ICD-10-CM | POA: Diagnosis present

## 2019-11-02 DIAGNOSIS — M109 Gout, unspecified: Secondary | ICD-10-CM | POA: Diagnosis present

## 2019-11-02 HISTORY — DX: Sepsis, unspecified organism: A41.9

## 2019-11-02 HISTORY — DX: Pneumonitis due to inhalation of food and vomit: J69.0

## 2019-11-02 LAB — CBC WITH DIFFERENTIAL/PLATELET
Abs Immature Granulocytes: 0.29 10*3/uL — ABNORMAL HIGH (ref 0.00–0.07)
Basophils Absolute: 0 10*3/uL (ref 0.0–0.1)
Basophils Relative: 0 %
Eosinophils Absolute: 0 10*3/uL (ref 0.0–0.5)
Eosinophils Relative: 0 %
HCT: 35.1 % — ABNORMAL LOW (ref 39.0–52.0)
Hemoglobin: 11.2 g/dL — ABNORMAL LOW (ref 13.0–17.0)
Immature Granulocytes: 2 %
Lymphocytes Relative: 3 %
Lymphs Abs: 0.6 10*3/uL — ABNORMAL LOW (ref 0.7–4.0)
MCH: 31.7 pg (ref 26.0–34.0)
MCHC: 31.9 g/dL (ref 30.0–36.0)
MCV: 99.4 fL (ref 80.0–100.0)
Monocytes Absolute: 0.8 10*3/uL (ref 0.1–1.0)
Monocytes Relative: 4 %
Neutro Abs: 17.2 10*3/uL — ABNORMAL HIGH (ref 1.7–7.7)
Neutrophils Relative %: 91 %
Platelets: 146 10*3/uL — ABNORMAL LOW (ref 150–400)
RBC: 3.53 MIL/uL — ABNORMAL LOW (ref 4.22–5.81)
RDW: 15.9 % — ABNORMAL HIGH (ref 11.5–15.5)
WBC: 18.9 10*3/uL — ABNORMAL HIGH (ref 4.0–10.5)
nRBC: 0 % (ref 0.0–0.2)

## 2019-11-02 LAB — PROTIME-INR
INR: 1.2 (ref 0.8–1.2)
Prothrombin Time: 14.3 seconds (ref 11.4–15.2)

## 2019-11-02 LAB — COMPREHENSIVE METABOLIC PANEL
ALT: 23 U/L (ref 0–44)
AST: 44 U/L — ABNORMAL HIGH (ref 15–41)
Albumin: 3 g/dL — ABNORMAL LOW (ref 3.5–5.0)
Alkaline Phosphatase: 47 U/L (ref 38–126)
Anion gap: 12 (ref 5–15)
BUN: 62 mg/dL — ABNORMAL HIGH (ref 8–23)
CO2: 21 mmol/L — ABNORMAL LOW (ref 22–32)
Calcium: 9.9 mg/dL (ref 8.9–10.3)
Chloride: 105 mmol/L (ref 98–111)
Creatinine, Ser: 3.71 mg/dL — ABNORMAL HIGH (ref 0.61–1.24)
GFR calc Af Amer: 17 mL/min — ABNORMAL LOW (ref >60)
GFR calc non Af Amer: 14 mL/min — ABNORMAL LOW (ref >60)
Glucose, Bld: 222 mg/dL — ABNORMAL HIGH (ref 70–99)
Potassium: 5.2 mmol/L — ABNORMAL HIGH (ref 3.5–5.1)
Sodium: 138 mmol/L (ref 135–145)
Total Bilirubin: 0.9 mg/dL (ref 0.3–1.2)
Total Protein: 6.1 g/dL — ABNORMAL LOW (ref 6.5–8.1)

## 2019-11-02 LAB — CBG MONITORING, ED: Glucose-Capillary: 174 mg/dL — ABNORMAL HIGH (ref 70–99)

## 2019-11-02 LAB — APTT: aPTT: 28 seconds (ref 24–36)

## 2019-11-02 LAB — LACTIC ACID, PLASMA
Lactic Acid, Venous: 4.4 mmol/L (ref 0.5–1.9)
Lactic Acid, Venous: 2 mmol/L (ref 0.5–1.9)

## 2019-11-02 LAB — SARS CORONAVIRUS 2 BY RT PCR (HOSPITAL ORDER, PERFORMED IN ~~LOC~~ HOSPITAL LAB): SARS Coronavirus 2: NEGATIVE

## 2019-11-02 IMAGING — US US RENAL
1 series · 14 of 25 positions shown · non-contrast
Comparison: Prior CT from [DATE].

CLINICAL DATA: Initial evaluation for acute renal insufficiency.

EXAM:
RENAL / URINARY TRACT ULTRASOUND COMPLETE

[Series 1: us renal · 14 of 29 slices shown]
[im 1/29]
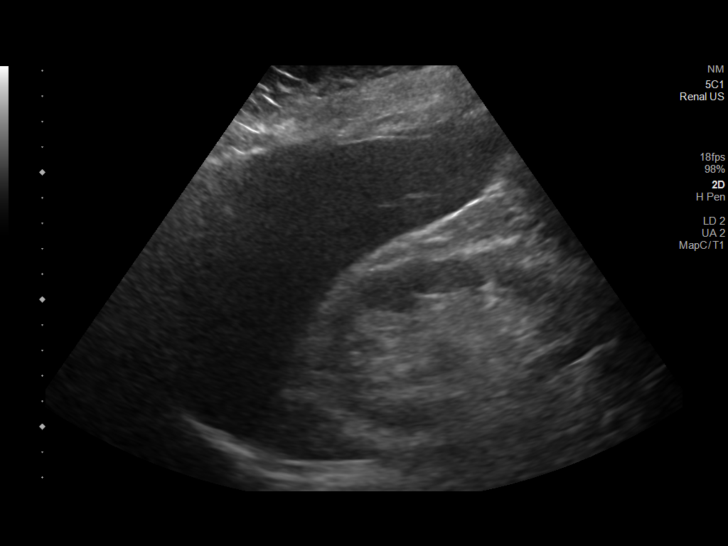
[im 3/29]
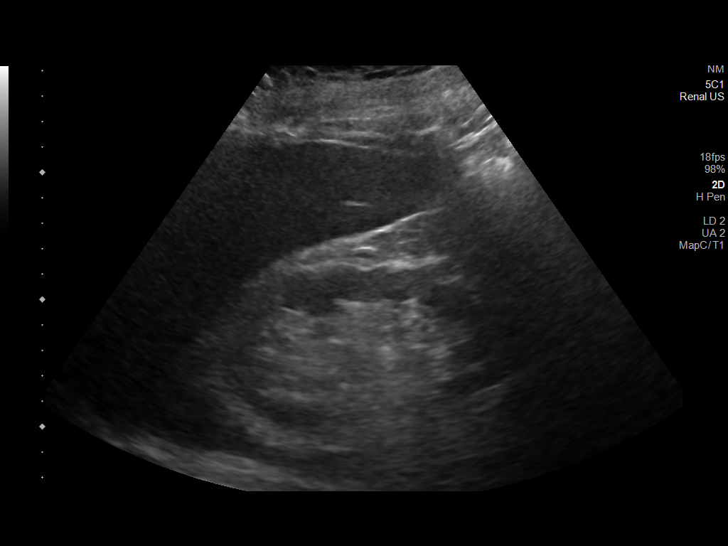
[im 5/29]
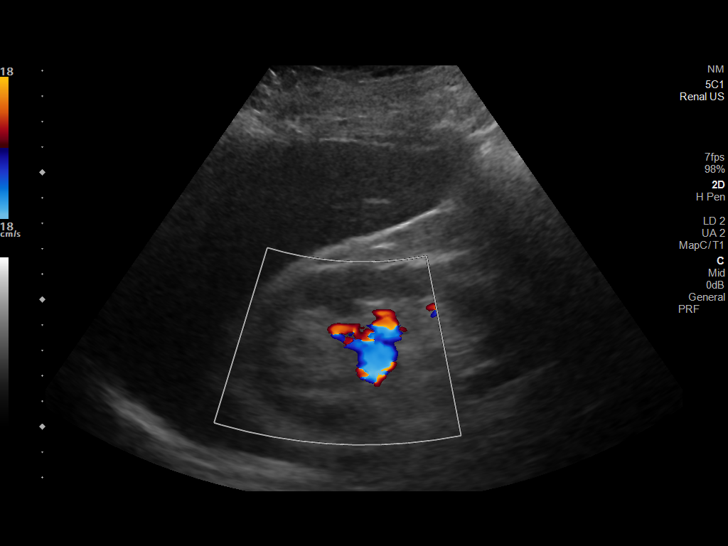
[im 8/29]
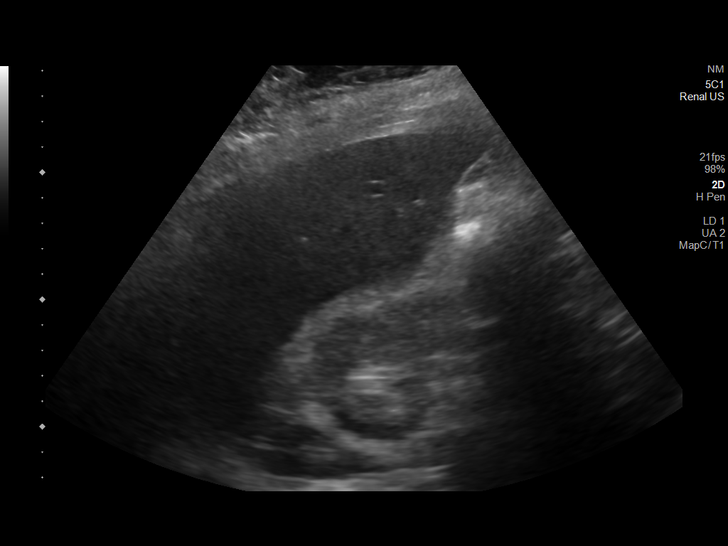
[im 10/29]
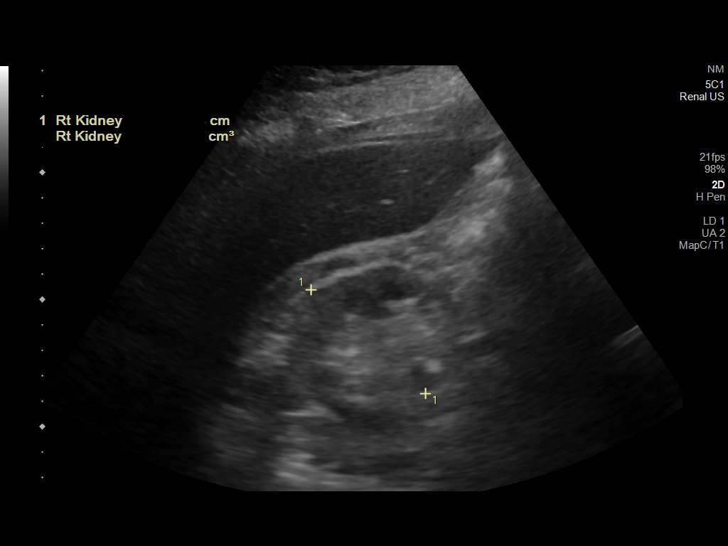
[im 11/29]
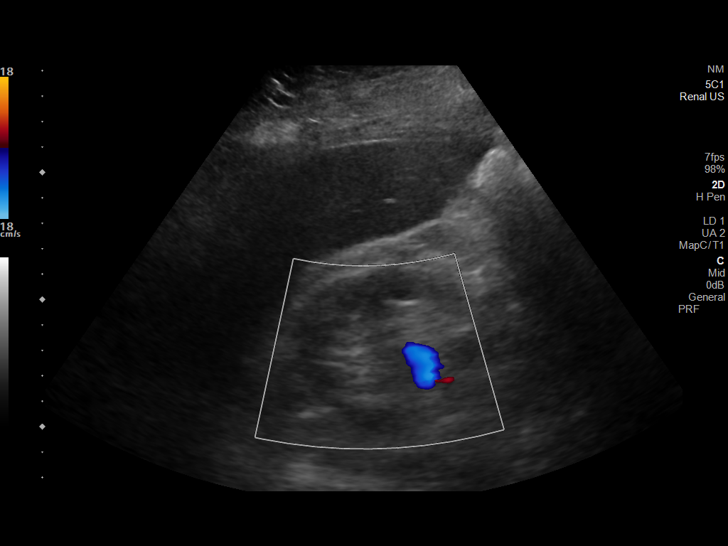
[im 13/29]
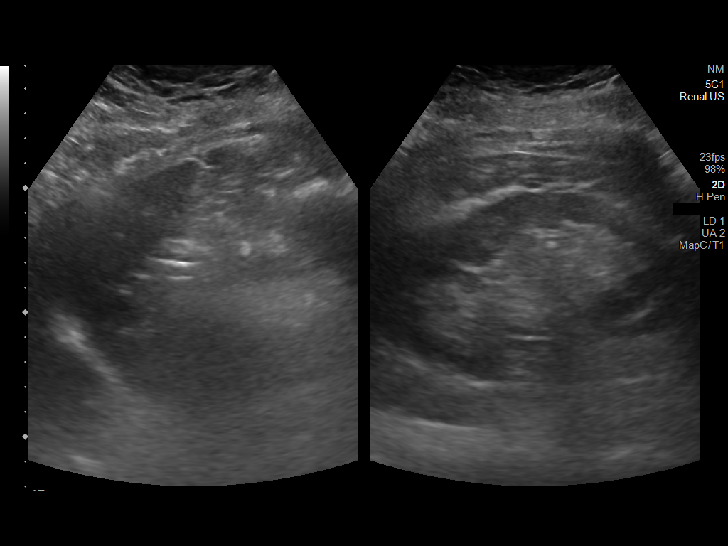
[im 16/29]
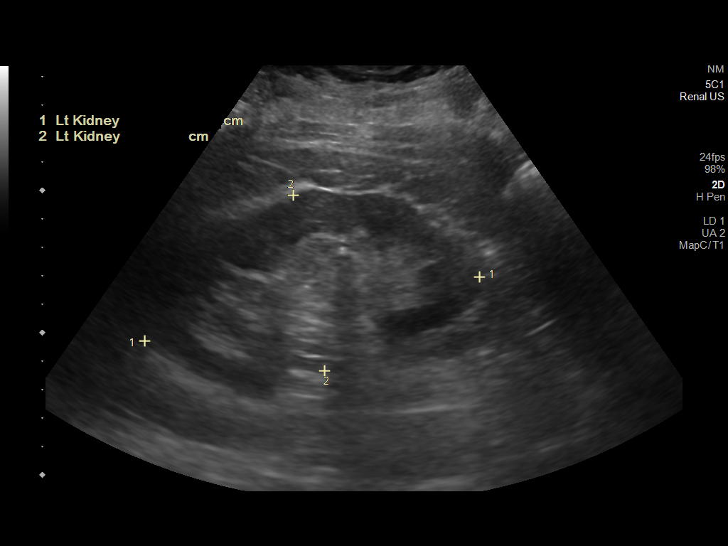
[im 18/29]
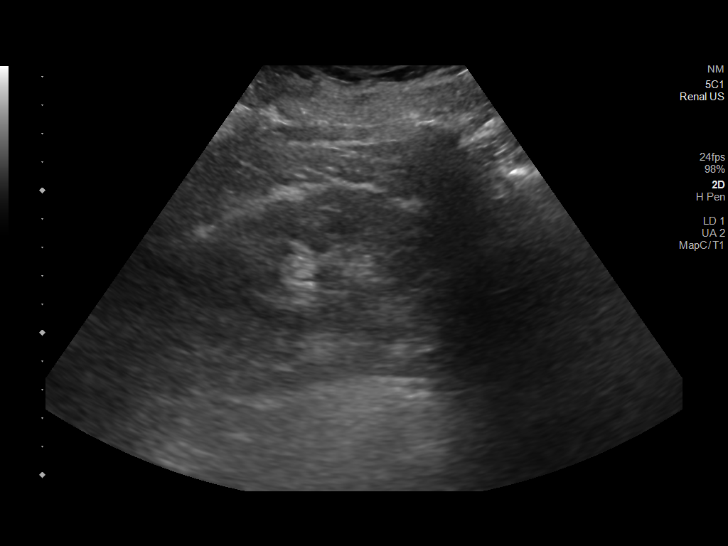
[im 19/29]
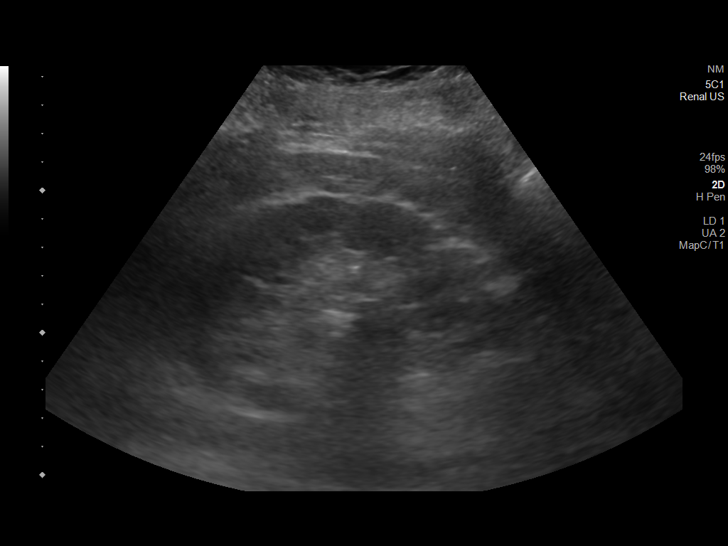
[im 22/29]
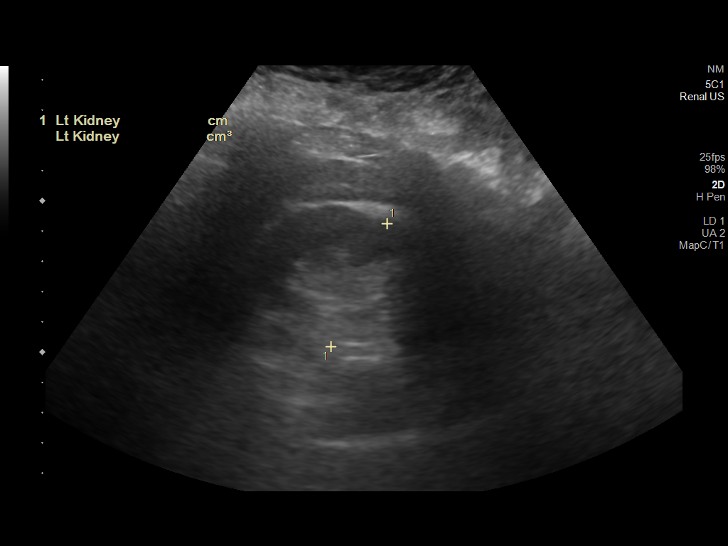
[im 24/29]
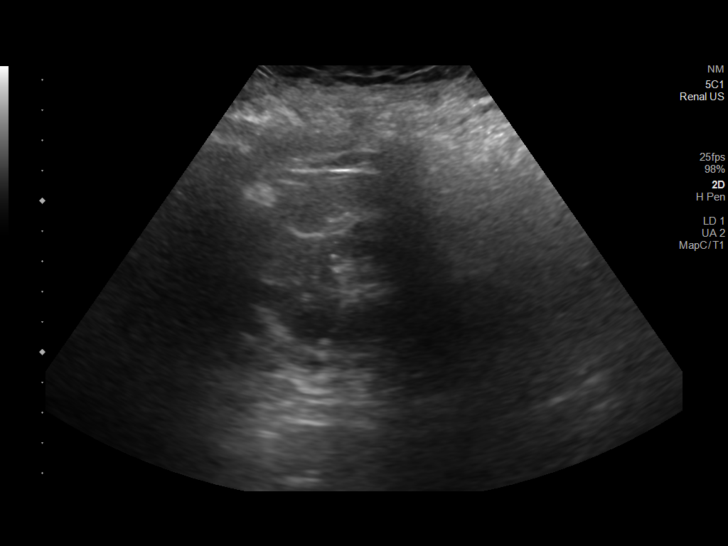
[im 26/29]
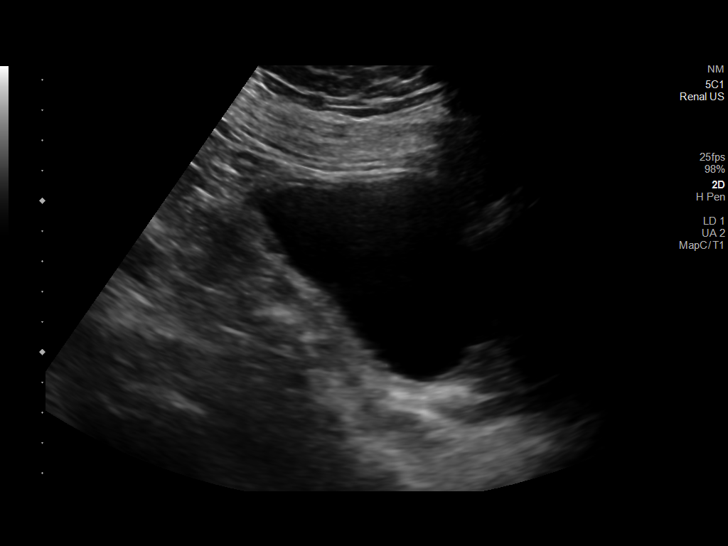
[im 29/29]
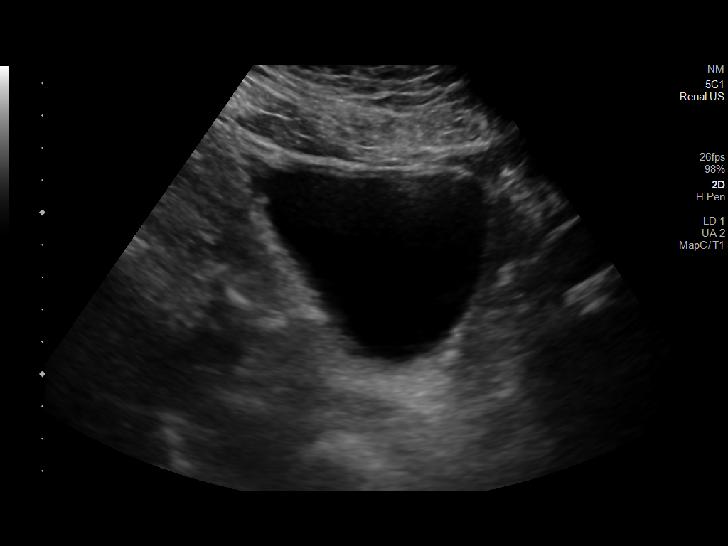

[14 of 25 positions shown; findings below may reference images not displayed]

FINDINGS: Right Kidney:

Renal measurements: 10.8 x 5.9 x 6.1 cm = volume: 200.5 mL. Mild
diffuse cortical thinning seen about the right kidney. Renal
echogenicity mildly increased. No nephrolithiasis or hydronephrosis.
No focal renal mass.

Left Kidney:

Renal measurements: 12.0 x 6.3 x 4.5 cm = volume: 175.8 mL. Diffuse
cortical thinning with mildly increased echogenicity within the
renal parenchyma. No nephrolithiasis or hydronephrosis. No focal
renal mass.

Bladder:

Small amount of layering echogenic debris noted within the bladder
lumen.

Other:

None.
IMPRESSION: 1. Diffuse cortical thinning with mildly increased echogenicity
within the renal parenchyma, consistent with chronic medical renal
disease.
2. No hydronephrosis.
3. Small amount of layering echogenic material/debris within the
bladder lumen. Correlation with urinalysis recommended.

## 2019-11-02 IMAGING — DX DG FOOT COMPLETE 3+V*L*
3 series · 3 of 3 positions shown · non-contrast
Comparison: None.

CLINICAL DATA: Left foot and left ankle swelling.

EXAM:
LEFT FOOT - COMPLETE 3+ VIEW

[foot ap]
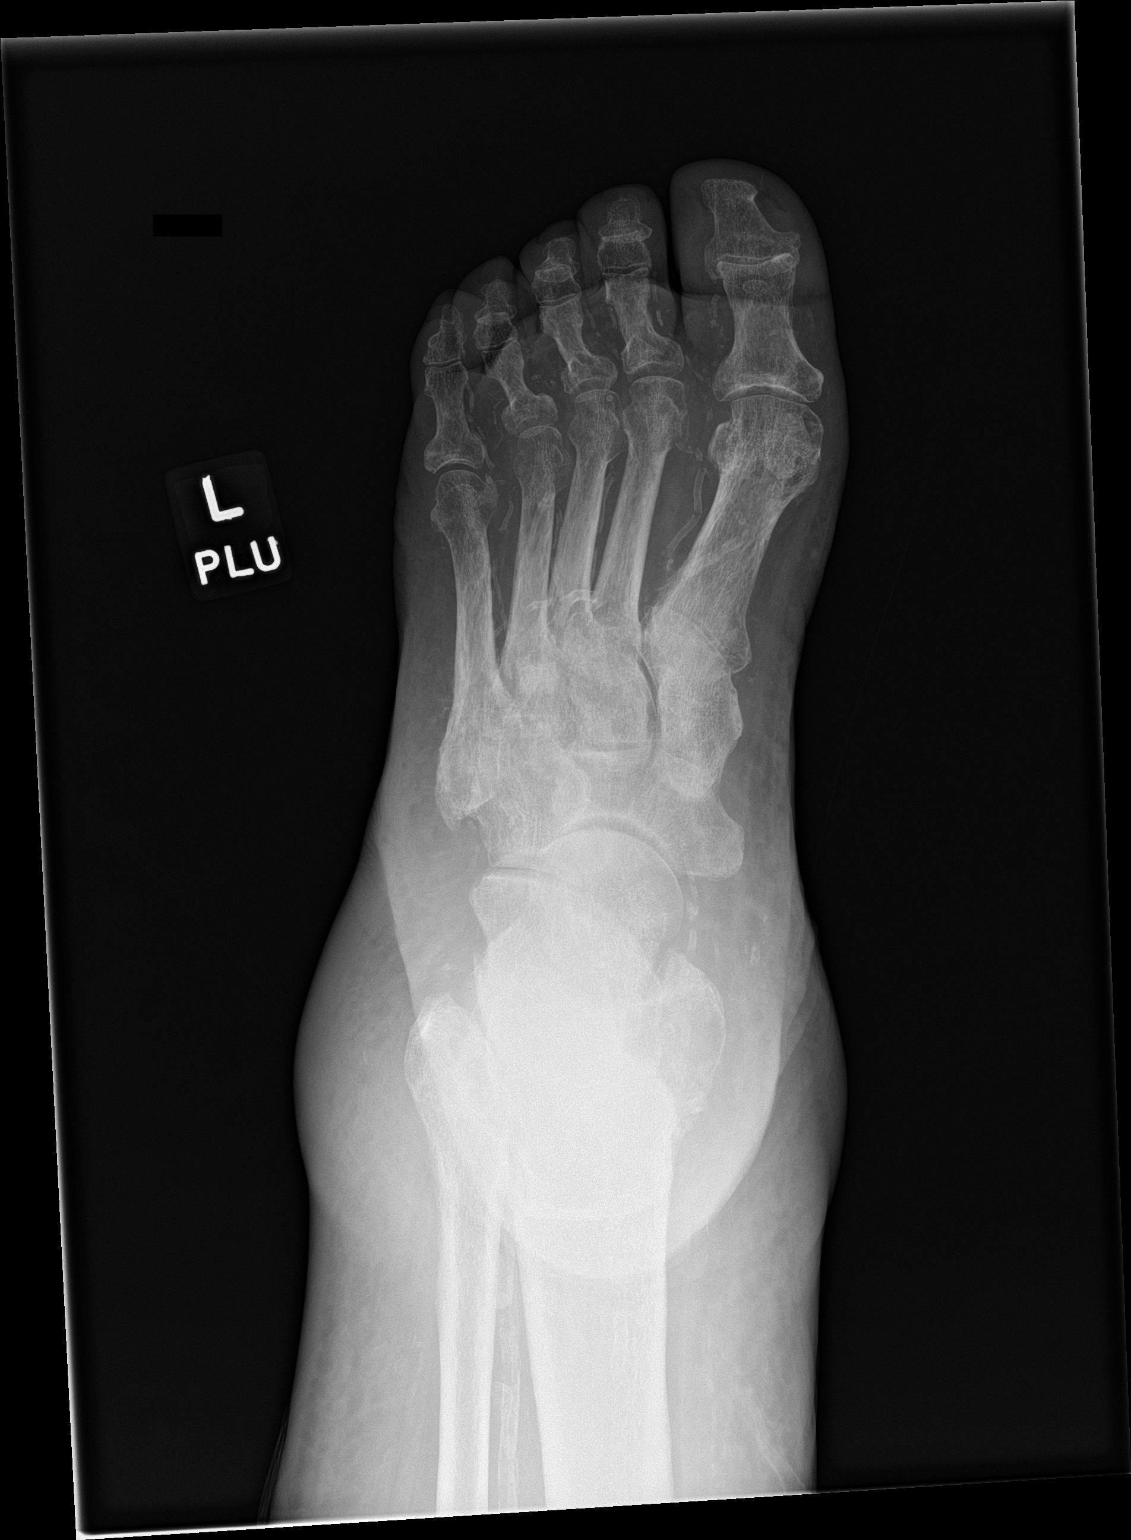

[foot obl]
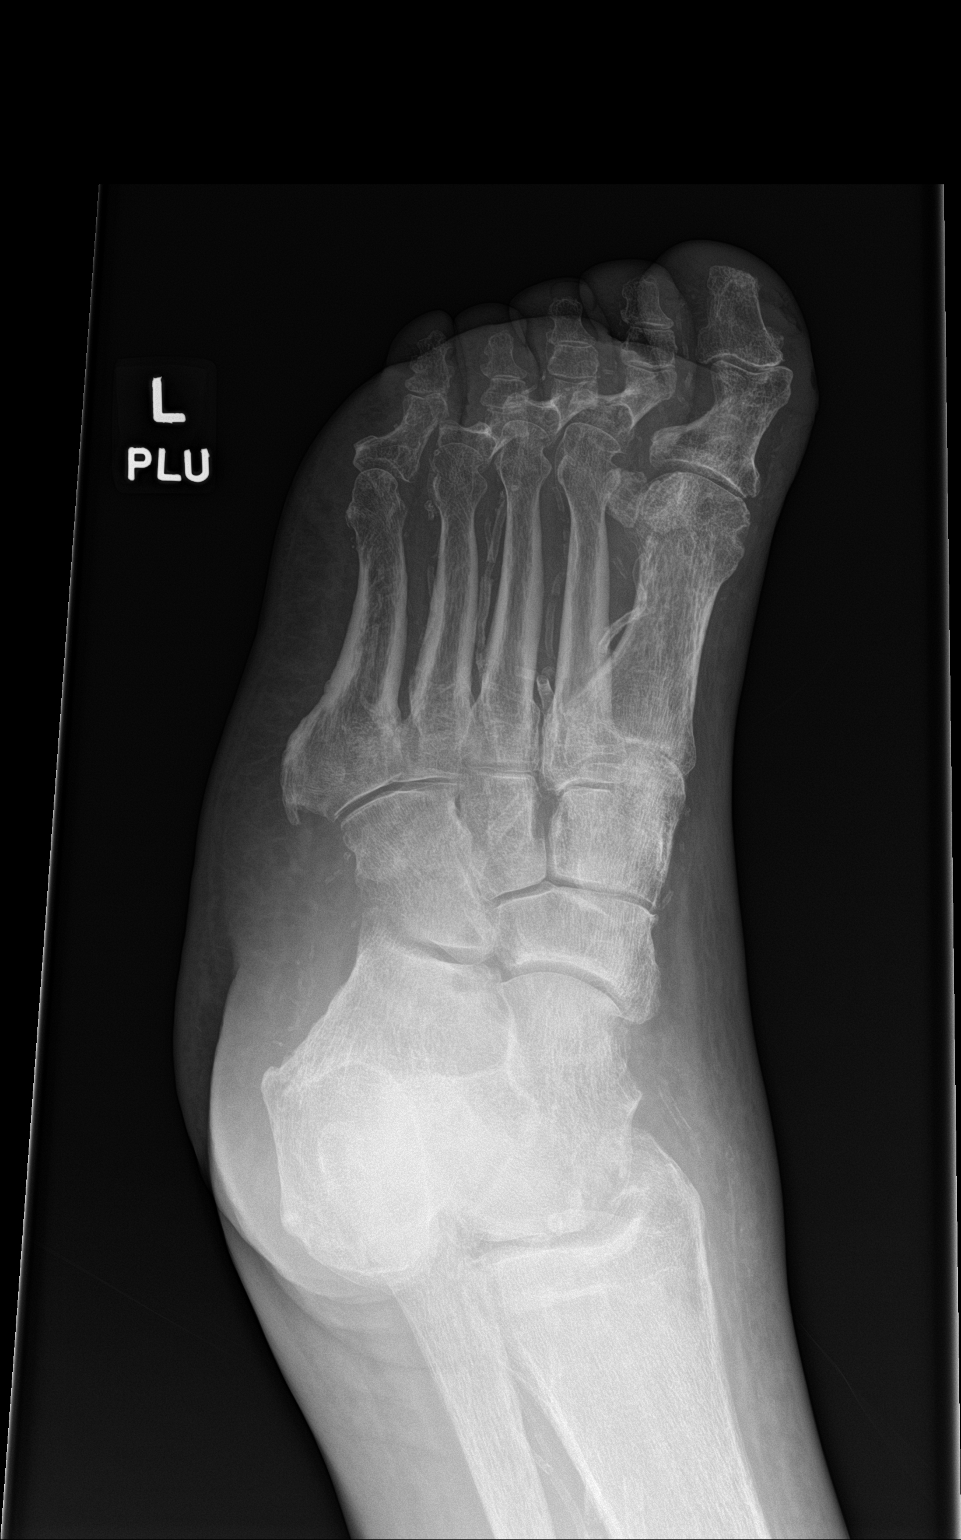

[foot lat]
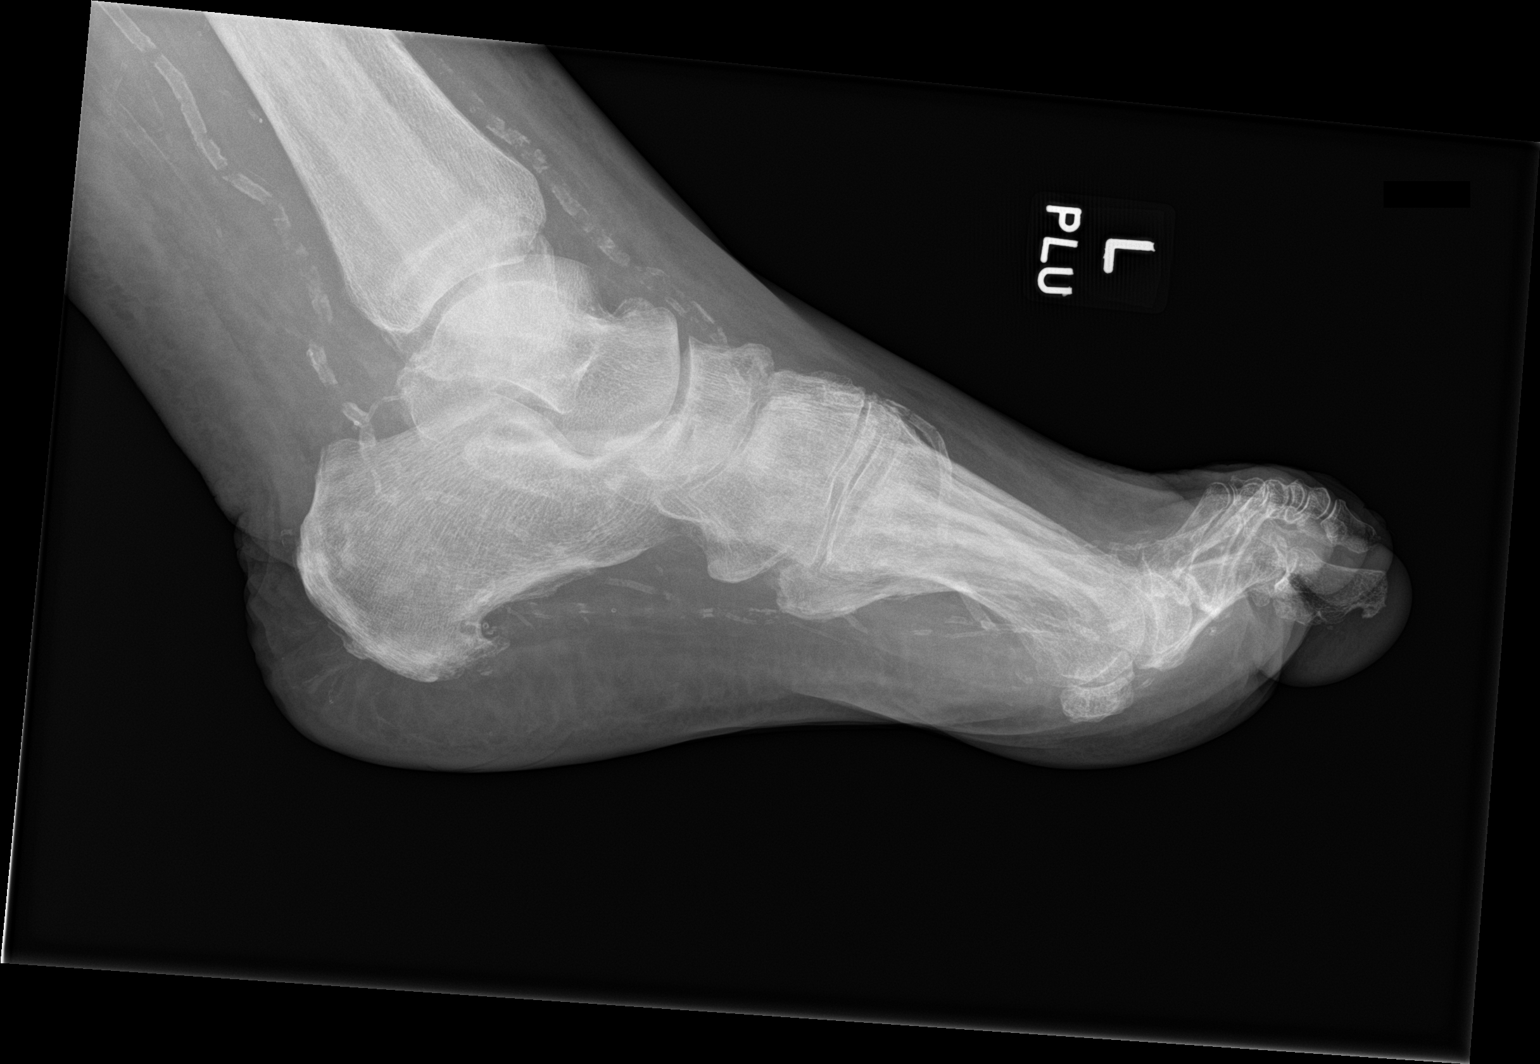

[3 of 3 positions shown; findings below may reference images not displayed]

FINDINGS: There is no evidence of acute fracture or dislocation. Moderate
severity degenerative changes seen along the dorsal aspect of the
mid left foot. A moderate to large plantar calcaneal spur is seen.
Marked severity vascular calcification is noted. Mild to moderate
severity diffuse soft tissue swelling is seen.
IMPRESSION: Moderate severity degenerative changes, with diffuse soft tissue
swelling.

## 2019-11-02 IMAGING — DX DG CHEST 1V PORT
1 series · 1 of 1 positions shown · non-contrast
Comparison: [DATE]

CLINICAL DATA: Fever.

EXAM:
PORTABLE CHEST 1 VIEW

[chest ap]
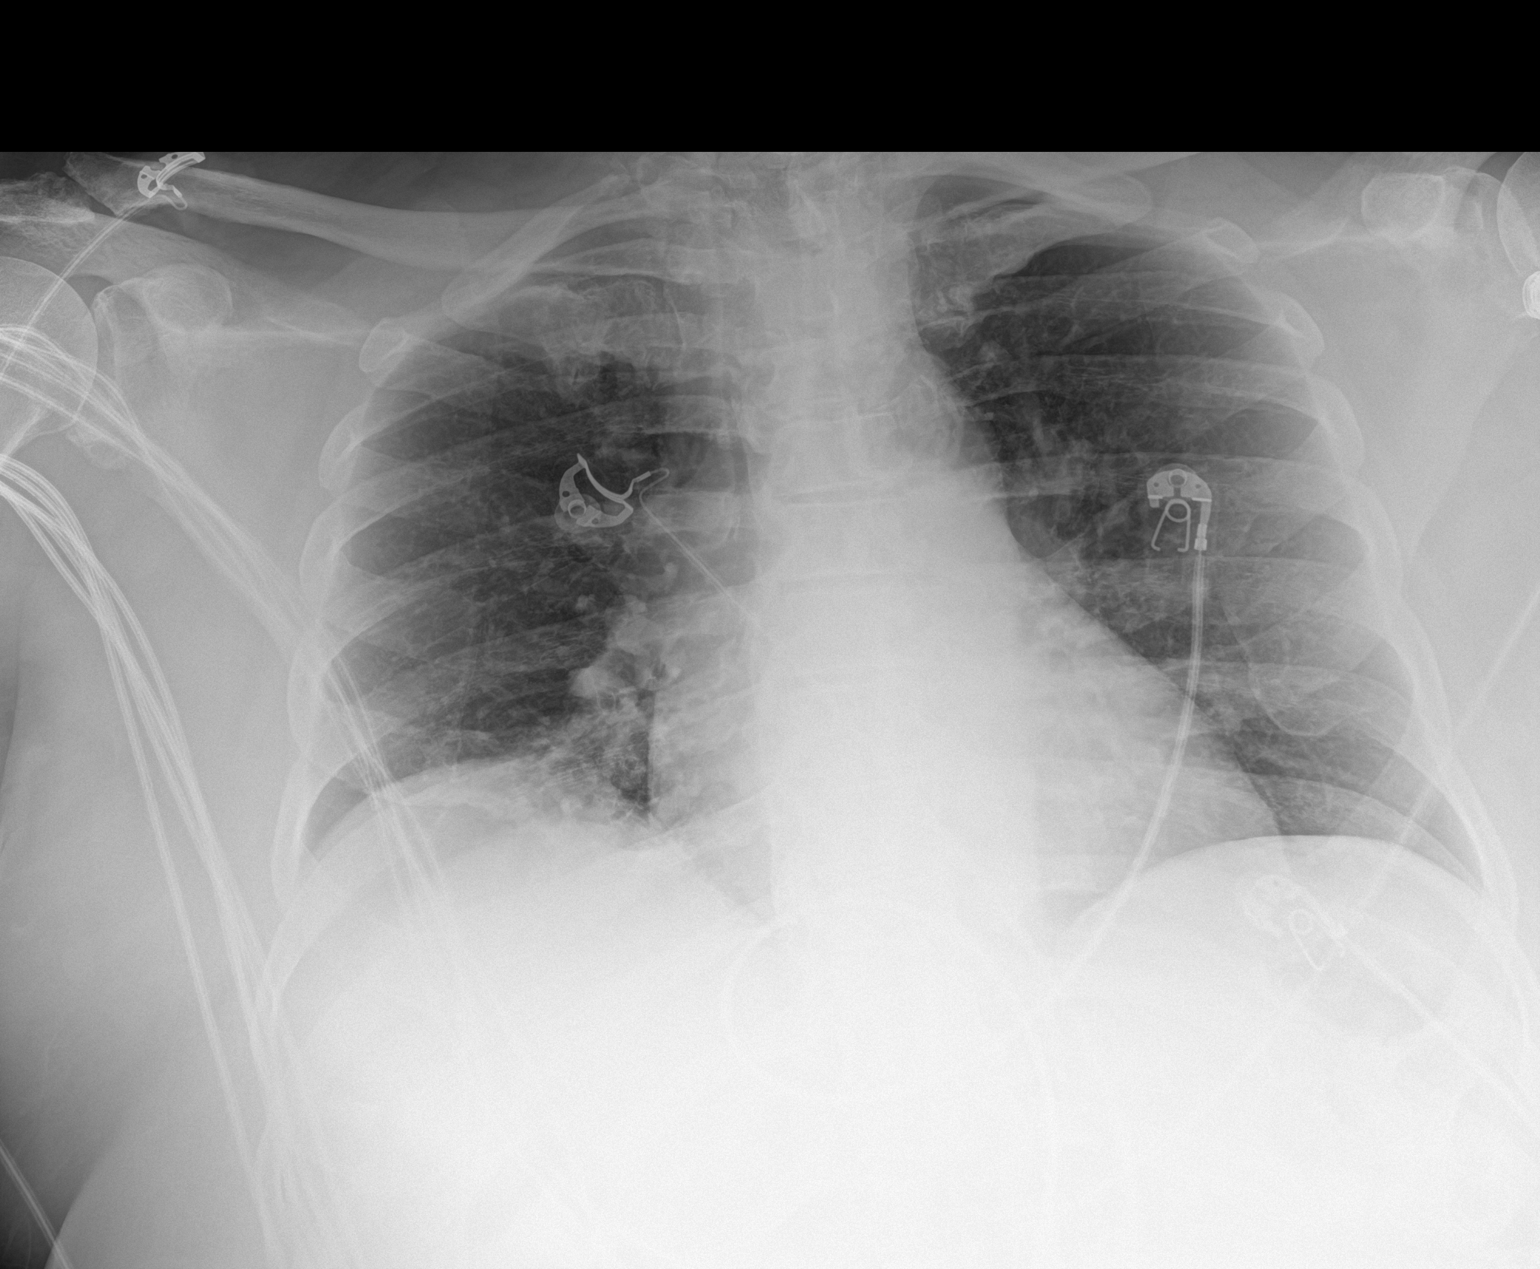

[1 of 1 positions shown; findings below may reference images not displayed]

FINDINGS: There is a patchy airspace opacity involving the medial right lung
base. There is no pneumothorax. No large pleural effusion. The heart
size is stable from prior study. There is no acute osseous
abnormality. Aortic calcifications are noted.
IMPRESSION: Patchy airspace opacity involving the medial right lung base,
suspicious for pneumonia.

## 2019-11-02 MED ORDER — ONDANSETRON HCL 4 MG/2ML IJ SOLN: 4.0000 mg | Freq: Four times a day (QID) | INTRAMUSCULAR | Status: DC | PRN

## 2019-11-02 MED ORDER — ACETAMINOPHEN 650 MG RE SUPP: 650.0000 mg | Freq: Four times a day (QID) | RECTAL | Status: DC | PRN

## 2019-11-02 MED ORDER — SODIUM CHLORIDE 0.9 % IV BOLUS
500.0000 mL | Freq: Once | INTRAVENOUS | Status: AC
  Administered 2019-11-02: 500 mL via INTRAVENOUS

## 2019-11-02 MED ORDER — ACETAMINOPHEN 325 MG PO TABS: 650.0000 mg | ORAL_TABLET | Freq: Four times a day (QID) | ORAL | Status: DC | PRN

## 2019-11-02 MED ORDER — HEPARIN SODIUM (PORCINE) 5000 UNIT/ML IJ SOLN
5000.0000 [IU] | Freq: Three times a day (TID) | INTRAMUSCULAR | Status: DC
  Administered 2019-11-02 – 2019-11-07 (×15): 5000 [IU] via SUBCUTANEOUS
  Filled 2019-11-02 (×16): qty 1

## 2019-11-02 MED ORDER — SODIUM CHLORIDE 0.9 % IV SOLN: 500.0000 mg | INTRAVENOUS | Status: DC

## 2019-11-02 MED ORDER — INSULIN ASPART 100 UNIT/ML ~~LOC~~ SOLN
0.0000 [IU] | Freq: Three times a day (TID) | SUBCUTANEOUS | Status: DC
  Administered 2019-11-03: 3 [IU] via SUBCUTANEOUS
  Administered 2019-11-04: 5 [IU] via SUBCUTANEOUS
  Administered 2019-11-04 – 2019-11-05 (×4): 2 [IU] via SUBCUTANEOUS
  Administered 2019-11-05: 3 [IU] via SUBCUTANEOUS
  Administered 2019-11-06: 2 [IU] via SUBCUTANEOUS
  Administered 2019-11-06 – 2019-11-07 (×4): 3 [IU] via SUBCUTANEOUS
  Administered 2019-11-07: 5 [IU] via SUBCUTANEOUS
  Administered 2019-11-08: 3 [IU] via SUBCUTANEOUS

## 2019-11-02 MED ORDER — SODIUM CHLORIDE 0.9 % IV SOLN
1.0000 g | Freq: Once | INTRAVENOUS | Status: AC
  Administered 2019-11-02: 1 g via INTRAVENOUS
  Filled 2019-11-02: qty 10

## 2019-11-02 MED ORDER — LINEZOLID 600 MG/300ML IV SOLN
600.0000 mg | Freq: Two times a day (BID) | INTRAVENOUS | Status: DC
  Filled 2019-11-02: qty 300

## 2019-11-02 MED ORDER — SODIUM CHLORIDE 0.9 % IV SOLN
500.0000 mg | Freq: Once | INTRAVENOUS | Status: AC
  Administered 2019-11-02: 500 mg via INTRAVENOUS
  Filled 2019-11-02: qty 500

## 2019-11-02 MED ORDER — INSULIN ASPART 100 UNIT/ML ~~LOC~~ SOLN
0.0000 [IU] | Freq: Every day | SUBCUTANEOUS | Status: DC
  Administered 2019-11-07: 2 [IU] via SUBCUTANEOUS

## 2019-11-02 MED ORDER — SODIUM CHLORIDE 0.9 % IV SOLN: 2.0000 g | INTRAVENOUS | Status: DC

## 2019-11-02 MED ORDER — ACETAMINOPHEN 325 MG PO TABS
650.0000 mg | ORAL_TABLET | Freq: Once | ORAL | Status: AC
  Administered 2019-11-02: 650 mg via ORAL
  Filled 2019-11-02: qty 2

## 2019-11-02 MED ORDER — SODIUM CHLORIDE 0.9 % IV BOLUS
1000.0000 mL | Freq: Once | INTRAVENOUS | Status: AC
  Administered 2019-11-02: 1000 mL via INTRAVENOUS

## 2019-11-02 MED ORDER — SODIUM CHLORIDE 0.9 % IV SOLN
3.0000 g | Freq: Two times a day (BID) | INTRAVENOUS | Status: DC
  Administered 2019-11-03 – 2019-11-06 (×8): 3 g via INTRAVENOUS
  Filled 2019-11-02 (×4): qty 3
  Filled 2019-11-02: qty 8
  Filled 2019-11-02 (×4): qty 3

## 2019-11-02 NOTE — ED Provider Notes (Signed)
[facility] EMERGENCY DEPARTMENT Provider Note   CSN: [id number] Arrival date & time: 11/02/19  1847     History Chief Complaint  Patient presents with  . Weakness    Micheal Walls is a 84 y.o. male.  The history is provided by the patient.  Weakness Severity:  Mild Onset quality:  Gradual Timing:  Constant Progression:  Unchanged Chronicity:  New Context comment:  Diarrhea and nausea today, left leg gave out on him today after using bathroom. Having chills as well. Fever with ems. No cough, no UTI symtpoms. Vaccinated from covid. No recent abx. One episode of loose stools. Blood pressure in 80s with EMS. GIven 500cc Relieved by:  Rest Worsened by:  Nothing Associated symptoms: diarrhea and nausea   Associated symptoms: no abdominal pain, no arthralgias, no chest pain, no cough, no dysuria, no fever, no seizures, no shortness of breath and no vomiting   Risk factors: congestive heart failure and diabetes        Past Medical History:  Diagnosis Date  . Amputated below knee ([place])    right  . Anemia   . Diastolic heart failure ([place])   . Diverticulosis   . DM (diabetes mellitus) ([place])   . Gout   . Hyperlipemia   . OSA on CPAP   . RBBB   . Systemic hypertension     Patient Active Problem List   Diagnosis Date Noted  . Elevated TSH 07/10/2018  . Nephropathy 02/28/2018  . Disturbance of skin sensation 02/28/2018  . PAD (peripheral artery disease) ([place]) 07/17/2017  . 110.1 10/21/2013  . Epiphora 04/24/2013  . Laxity of eyelid 04/24/2013  . Anemia 04/09/2013  . History of peripheral vascular disease 04/09/2013  . History of stroke 04/09/2013  . S/P unilateral BKA (below knee amputation) ([place]) 03/17/2013  . Diabetes mellitus type 2 in obese ([place]) 03/17/2013  . Mixed hyperlipidemia 03/17/2013  . Essential hypertension 03/17/2013  . Gout 03/17/2013  . CKD (chronic kidney disease) stage 3, GFR 30-59 ml/min 03/17/2013  . OSA on CPAP 03/17/2013   . Morbid obesity ([place]) 03/17/2013  . Anemia, iron deficiency 03/17/2013  . Diverticulosis of colon 03/17/2013  . RBBB 03/17/2013  . Punctal stenosis, acquired 10/02/2012    Past Surgical History:  Procedure Laterality Date  . LEG AMPUTATION BELOW KNEE     right  . NM MYOCAR PERF WALL MOTION  09/18/2009   no ischemia       Family History  Problem Relation Age of Onset  . Diabetes Mother   . Heart attack Father   . Cancer Sister     Social History   Tobacco Use  . Smoking status: Former Smoker    Types: Cigars    Quit date: 06/12/2001    Years since quitting: 18.4  . Smokeless tobacco: Never Used  Substance Use Topics  . Alcohol use: No    Alcohol/week: 0.0 standard drinks  . Drug use: No    Home Medications Prior to Admission medications   Medication Sig Start Date End Date Taking? Authorizing Provider  allopurinol (ZYLOPRIM) 100 MG tablet Take 1 tablet (100 mg total) by mouth daily. Patient taking differently: Take 100 mg by mouth 2 (two) times daily.  06/18/18   [name], [name]  antiseptic oral rinse (BIOTENE) LIQD 15 mLs by Mouth Rinse route as needed for dry mouth.    [provider]  aspirin 81 MG tablet Take 162 mg by mouth daily.    [provider]  [name] Use a cotton tip applicator. Paint between toes of left foot as needed once daily. 10/09/18   [name], DPM  colchicine 0.6 MG tablet Take 0.6 mg by mouth as needed (gout flare up).     [provider]  diltiazem (CARDIZEM CD) 300 MG 24 hr capsule TAKE ONE CAPSULE EACH DAY 08/12/19   [name], [name]  diltiazem (TIAZAC) 300 MG 24 hr capsule Take 300 mg by mouth as directed. 09/02/19   [provider]  dorzolamide (TRUSOPT) 2 % ophthalmic solution Place 1 drop into both eyes 2 (two) times daily. 06/20/18   [provider]  ferrous sulfate 325 (65 FE) MG tablet Take 325 mg by mouth daily with breakfast.    [provider]  fish oil-omega-3 fatty acids 1000 MG capsule Take 2 g by mouth daily.    [provider]  furosemide (LASIX) 40 MG tablet Take 1 tablet by mouth twice a day 05/23/18   [name], [name]  gabapentin (NEURONTIN) 100 MG capsule Take 1 capsule (100 mg total) by mouth 3 (three) times daily. 06/25/19 06/24/20  [name], [name]  glucose blood (FREESTYLE PRECISION NEO TEST) test strip Use as instructed 07/30/19   [name], [name]  hydrocortisone (ANUSOL-HC) 25 MG suppository Place 25 mg rectally 2 (two) times daily as needed for hemorrhoids or anal itching.    [provider]  insulin degludec (TRESIBA FLEXTOUCH) 100 UNIT/ML FlexTouch Pen Inject 10 Units into the skin at bedtime.    [provider]  ketotifen (ALAWAY) 0.025 % ophthalmic solution Place 1 drop into both eyes 2 (two) times daily.    [provider]  latanoprost (XALATAN) 0.005 % ophthalmic solution  10/12/18   [provider]  losartan (COZAAR) 100 MG tablet Take 1 tablet (100 mg total) by mouth daily. 12/17/18   [name], [name]  losartan (COZAAR) 50 MG tablet SMARTSIG:1 Tablet(s) By Mouth Every Evening 09/02/19   [provider]  Multiple Vitamin (MULTIVITAMIN) tablet Take 1 tablet by mouth daily.    [provider]  niacin (NIASPAN) 500 MG CR tablet TAKE 2 TABLETS AT BEDTIME 10/29/19   [name], [name]  pantoprazole (PROTONIX) 40 MG tablet Take 1 tablet (40 mg total) by mouth daily. 06/25/19   [name], [name]  Polyvinyl Alcohol-Povidone (REFRESH OP) Apply 1 drop to eye 2 (two) times daily.    [provider]  pramoxine-hydrocortisone (PRAMOSONE) cream Apply topically 3 (three) times daily. Apply topically to hemorrhoids four times daily as needed after bowel movements, for external use only    [provider]  PRESCRIPTION MEDICATION Place 1 drop into both eyes 2 (two) times daily. For glucoma    [provider]  REFRESH PLUS 0.5 % SOLN Apply 1  drop to eye 2 (two) times daily. 12/24/18   [provider]  rosuvastatin (CRESTOR) 10 MG tablet Take 1 tablet (10 mg total) by mouth daily. Patient taking differently: Take 10 mg by mouth once a week.  12/17/18   [name], [name]  Semaglutide, [name], (OZEMPIC, [name],) 2 MG/1.5ML SOPN Inject 1 mg into the skin once a week. 09/24/19   [name], [name]    Allergies    Patient has no known allergies.  Review of Systems   Review of Systems  Constitutional: Negative for chills and fever.  HENT: Negative for ear pain and sore throat.   Eyes: Negative for pain  and visual disturbance.  Respiratory: Negative for cough and shortness of breath.   Cardiovascular: Negative for chest pain and palpitations.  Gastrointestinal: Positive for diarrhea and nausea. Negative for abdominal pain and vomiting.  Genitourinary: Negative for dysuria and hematuria.  Musculoskeletal: Negative for arthralgias and back pain.  Skin: Negative for color change and rash.  Neurological: Positive for weakness. Negative for seizures and syncope.  All other systems reviewed and are negative.   Physical Exam Updated Vital Signs  ED Triage Vitals  Enc Vitals Group     BP 11/02/19 1900 (!) 113/54     Pulse Rate 11/02/19 1900 (!) 105     Resp 11/02/19 1900 (!) 27     Temp 11/02/19 1901 100.1 F (37.8 C)     Temp Source 11/02/19 1901 Oral     SpO2 11/02/19 1900 96 %     Weight 11/02/19 1915 285 lb (129.3 kg)     Height 11/02/19 1915 $RemoveBefor'6\' 1"'[id number]$  (1.854 m)     Head Circumference --      Peak Flow --      Pain Score 11/02/19 1903 0     Pain Loc --      Pain Edu? --      Excl. in [place]? --     Physical Exam Vitals and nursing note reviewed.  Constitutional:      General: He is not in acute distress.    Appearance: He is well-developed. He is not ill-appearing.  HENT:     Head: Normocephalic and atraumatic.     Nose: Nose normal.     Mouth/Throat:     Mouth: Mucous membranes are moist.  Eyes:      Extraocular Movements: Extraocular movements intact.     Conjunctiva/sclera: Conjunctivae normal.     Pupils: Pupils are equal, round, and reactive to light.  Cardiovascular:     Rate and Rhythm: Normal rate and regular rhythm.     Pulses: Normal pulses.     Heart sounds: Normal heart sounds. No murmur.  Pulmonary:     Effort: Pulmonary effort is normal. No respiratory distress.     Breath sounds: Normal breath sounds.  Abdominal:     Palpations: Abdomen is soft.     Tenderness: There is no abdominal tenderness.  Musculoskeletal:     Cervical back: Normal range of motion and neck supple.     Comments: Right BKA  Skin:    General: Skin is warm and dry.     Capillary Refill: Capillary refill takes less than 2 seconds.     Comments: Some skin breakdown to the left big toe with area of ulceration and mild purulence but no significant erythema or crepitus around this area  Neurological:     General: No focal deficit present.     Mental Status: He is alert.  Psychiatric:        Mood and Affect: Mood normal.     ED Results / Procedures / Treatments   Labs (all labs ordered are listed, but only abnormal results are displayed) Labs Reviewed  LACTIC ACID, PLASMA - Abnormal; Notable for the following components:      Result Value   Lactic Acid, Venous 4.4 (*)    All other components within normal limits  COMPREHENSIVE METABOLIC PANEL - Abnormal; Notable for the following components:   Potassium 5.2 (*)    CO2 21 (*)    Glucose, Bld 222 (*)    BUN 62 (*)    Creatinine,  Ser 3.71 (*)    Total Protein 6.1 (*)    Albumin 3.0 (*)    AST 44 (*)    GFR calc non Af Amer 14 (*)    GFR calc Af Amer 17 (*)    All other components within normal limits  CBC WITH DIFFERENTIAL/PLATELET - Abnormal; Notable for the following components:   WBC 18.9 (*)    RBC 3.53 (*)    Hemoglobin 11.2 (*)    HCT 35.1 (*)    RDW 15.9 (*)    Platelets 146 (*)    Neutro Abs 17.2 (*)    Lymphs Abs 0.6 (*)     Abs Immature Granulocytes 0.29 (*)    All other components within normal limits  CULTURE, BLOOD (ROUTINE X 2)  CULTURE, BLOOD (ROUTINE X 2)  URINE CULTURE  SARS CORONAVIRUS 2 BY RT PCR (HOSPITAL ORDER, [facility])  APTT  PROTIME-INR  LACTIC ACID, PLASMA  URINALYSIS, ROUTINE W REFLEX MICROSCOPIC    EKG EKG Interpretation  Date/Time:  Saturday Nov 02 2019 18:54:30 EDT Ventricular Rate:  107 PR Interval:    QRS Duration: 96 QT Interval:  336 QTC Calculation: 449 R Axis:   171 Text Interpretation: Sinus or ectopic atrial tachycardia Prolonged PR interval Right ventricular hypertrophy No significant change was found Confirmed by Lennice Sites [phone]) on 11/02/2019 6:57:12 PM   Radiology DG Chest Port 1 View  Result Date: 11/02/2019 CLINICAL DATA:  Fever. EXAM: PORTABLE CHEST 1 VIEW COMPARISON:  January 09, 2011 FINDINGS: There is a patchy airspace opacity involving the medial right lung base. There is no pneumothorax. No large pleural effusion. The heart size is stable from prior study. There is no acute osseous abnormality. Aortic calcifications are noted. IMPRESSION: Patchy airspace opacity involving the medial right lung base, suspicious for pneumonia. Electronically Signed   By: Constance Holster M.D.   On: 11/02/2019 19:59   DG Foot Complete Left  Result Date: 11/02/2019 CLINICAL DATA:  Left foot and left ankle swelling. EXAM: LEFT FOOT - COMPLETE 3+ VIEW COMPARISON:  None. FINDINGS: There is no evidence of acute fracture or dislocation. Moderate severity degenerative changes seen along the dorsal aspect of the mid left foot. A moderate to large plantar calcaneal spur is seen. Marked severity vascular calcification is noted. Mild to moderate severity diffuse soft tissue swelling is seen. IMPRESSION: Moderate severity degenerative changes, with diffuse soft tissue swelling. Electronically Signed   By: Virgina Norfolk M.D.   On: 11/02/2019 20:21     Procedures .Critical Care Performed by: Lennice Sites, DO Authorized by: Lennice Sites, DO   Critical care provider statement:    Critical care time (minutes):  40   Critical care was necessary to treat or prevent imminent or life-threatening deterioration of the following conditions:  Sepsis   Critical care was time spent personally by me on the following activities:  Blood draw for specimens, development of treatment plan with patient or surrogate, discussions with primary provider, evaluation of patient's response to treatment, examination of patient, obtaining history from patient or surrogate, ordering and performing treatments and interventions, ordering and review of laboratory studies, ordering and review of radiographic studies, pulse oximetry, re-evaluation of patient's condition and review of old charts   I assumed direction of critical care for this patient from another provider in my specialty: no     (including critical care time)  Medications Ordered in ED Medications  linezolid (ZYVOX) IVPB 600 mg (has no administration  in time range)  acetaminophen (TYLENOL) tablet 650 mg (650 mg Oral Given 11/02/19 1921)  sodium chloride 0.9 % bolus 500 mL (500 mLs Intravenous New Bag/Given 11/02/19 1938)  azithromycin (ZITHROMAX) 500 mg in sodium chloride 0.9 % 250 mL IVPB (0 mg Intravenous Stopped 11/02/19 2052)  cefTRIAXone (ROCEPHIN) 1 g in sodium chloride 0.9 % 100 mL IVPB (0 g Intravenous Stopped 11/02/19 2005)  sodium chloride 0.9 % bolus 1,000 mL (1,000 mLs Intravenous New Bag/Given 11/02/19 2006)    ED Course  I have reviewed the triage vital signs and the nursing notes.  Pertinent labs & imaging results that were available during my care of the patient were reviewed by me and considered in my medical decision making (see chart for details).    MDM Rules/Calculators/A&P                      EVANS LEVEE is an 84 year old male with history of diabetes, high  cholesterol, heart failure who presents to the ED with generalized weakness.  Had some nausea earlier today and had one episode of diarrhea.  He states that he had a chronic cough.  Denies any pain with urination.  No abdominal pain.  Upon arrival patient has fever and tachycardia although very mild.  Blood pressure however with EMS was in the 80s and he got 500 cc of fluid and blood pressure is now normal.  Felt okay yesterday.  Has been following with podiatry for left chronic wound of his left big toe.  Has a BKA on the right.  Overall he appears well.  No abdominal tenderness on exam.  Left big toe has area of ulceration with some swelling around but no obvious thick purulent drainage.  X-ray showed some soft tissue swelling but no evidence of osteomyelitis.  Possibly a source for his infection.  Chest x-ray showed possible pneumonia.  Overall possibly could have this is a source of infection as well but denies any sputum production.  Cough seems mostly chronic.  He is vaccinated against coronavirus.  White count was about 19, lactic acid was 4.4.  Patient was given 2 L of normal saline given his heart failure history and given that his vital signs were stable after 500 cc of fluid in the field.  Broad-spectrum IV antibiotics were started.  Creatinine mildly elevated above baseline at 3.71.  Otherwise no significant anemia.  Blood sugar is 222.  Overall suspect sepsis from possible pneumonia versus cellulitis.  Urinalysis has not been provided yet.  Hemodynamically patient is stable at this time.  To be admitted to medicine for further sepsis care.  This chart was dictated using voice recognition software.  Despite best efforts to proofread,  errors can occur which can change the documentation meaning.   Final Clinical Impression(s) / ED Diagnoses Final diagnoses:  Sepsis, due to unspecified organism, unspecified whether acute organ dysfunction present [facility])  Community acquired pneumonia, unspecified  laterality  Cellulitis, unspecified cellulitis site    Rx / DC Orders ED Discharge Orders    None       Lennice Sites, DO 11/02/19 2107

## 2019-11-02 NOTE — H&P (Addendum)
History and Physical    Micheal Walls TMA:[id number] DOB: [birth year] DOA: 11/02/2019  PCP: [name], [name] Patient coming from: Home  Chief Complaint: Generalized weakness, hypotension  HPI: Micheal Walls is a 84 y.o. 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  His blood pressure was 82/48 with EMS.  Improved with a 500 cc fluid bolus.  Febrile with temperature 100.3 F.  Patient states he has been having chills since last night.  He vomited twice yesterday evening and once this morning.  No abdominal pain or diarrhea.  Today while in the bathroom he was so weak that he fell.  States his son was there at that time and he did not fall hard or injure himself.  Denies hitting his head or sustaining any other injuries from the fall.  Reports having a chronic cough.  Denies shortness of breath or chest pain.  States he has had an infection of his left big toe for a month for which he is seen by podiatry.  Patient states he has received both doses of his Covid vaccine.  ED Course: Blood pressure 113/54 on arrival to the ED.  T-max 100.7 F.  Slightly tachycardic and tachypneic.  Labs showing leukocytosis with WBC count 18.9.  Lactic acid 4.4.  Hemoglobin 11.2, stable compared to prior labs.  BUN 62, creatinine 3.7.  Creatinine previously ranging between 1.9-2.4.  UA and urine culture pending.  Blood cultures pending.  SARS-CoV-2 PCR test pending.  Left big toe noted to have an area of ulceration with some swelling.  This is felt to be a possible source of infection.  X-ray showing soft tissue swelling but no evidence of osteomyelitis.  Chest x-ray showing patchy airspace opacity involving the medial right lung base suspicious for pneumonia.    Patient was given ceftriaxone, azithromycin, 1.5 L fluid boluses, and Tylenol.  Blood pressure improved to 112/64 after fluid boluses.  Review of Systems:  All systems reviewed and apart from history of presenting illness, are negative.  Past Medical History:  Diagnosis Date  . Amputated below knee ([place])    right  . Anemia   . Diastolic heart failure ([place])   . Diverticulosis   . DM (diabetes mellitus) ([place])   . Gout   . Hyperlipemia   . OSA on CPAP   . RBBB   . Systemic hypertension     Past Surgical History:  Procedure Laterality Date  . LEG AMPUTATION BELOW KNEE     right  . NM MYOCAR PERF WALL MOTION  09/18/2009   no ischemia     reports that he quit smoking about 18 years ago. His smoking use included cigars. He has never used smokeless tobacco. He reports that he does not drink alcohol or use drugs.  No Known Allergies  Family History  Problem Relation Age of Onset  . Diabetes Mother   . Heart attack Father   . Cancer Sister     Prior to Admission medications   Medication Sig Start Date End Date Taking? Authorizing Provider  allopurinol (ZYLOPRIM) 100 MG tablet Take 1 tablet (100 mg total) by mouth daily. Patient taking differently: Take 100 mg by mouth 2 (two) times daily.  06/18/18   [name], [name]  antiseptic oral rinse (BIOTENE) LIQD 15 mLs by Mouth Rinse  route as needed for dry mouth.    [provider]  aspirin 81 MG tablet Take 162 mg by mouth daily.    [provider]  [name] Use a cotton tip applicator. Paint between toes of left foot as needed once daily. 10/09/18   [name], DPM  colchicine 0.6 MG tablet Take 0.6 mg by mouth as needed (gout flare up).     [provider]  diltiazem (CARDIZEM CD) 300 MG 24 hr capsule TAKE ONE CAPSULE EACH DAY 08/12/19   [name], [name]  diltiazem (TIAZAC) 300 MG 24 hr capsule Take 300 mg by mouth as directed. 09/02/19   [provider]   dorzolamide (TRUSOPT) 2 % ophthalmic solution Place 1 drop into both eyes 2 (two) times daily. 06/20/18   [provider]  ferrous sulfate 325 (65 FE) MG tablet Take 325 mg by mouth daily with breakfast.    [provider]  fish oil-omega-3 fatty acids 1000 MG capsule Take 2 g by mouth daily.    [provider]  furosemide (LASIX) 40 MG tablet Take 1 tablet by mouth twice a day 05/23/18   [name], [name]  gabapentin (NEURONTIN) 100 MG capsule Take 1 capsule (100 mg total) by mouth 3 (three) times daily. 06/25/19 06/24/20  [name], [name]  glucose blood (FREESTYLE PRECISION NEO TEST) test strip Use as instructed 07/30/19   [name], [name]  hydrocortisone (ANUSOL-HC) 25 MG suppository Place 25 mg rectally 2 (two) times daily as needed for hemorrhoids or anal itching.    [provider]  insulin degludec (TRESIBA FLEXTOUCH) 100 UNIT/ML FlexTouch Pen Inject 10 Units into the skin at bedtime.    [provider]  ketotifen (ALAWAY) 0.025 % ophthalmic solution Place 1 drop into both eyes 2 (two) times daily.    [provider]  latanoprost (XALATAN) 0.005 % ophthalmic solution  10/12/18   [provider]  losartan (COZAAR) 100 MG tablet Take 1 tablet (100 mg total) by mouth daily. 12/17/18   [name], [name]  losartan (COZAAR) 50 MG tablet SMARTSIG:1 Tablet(s) By Mouth Every Evening 09/02/19   [provider]  Multiple Vitamin (MULTIVITAMIN) tablet Take 1 tablet by mouth daily.    [provider]  niacin (NIASPAN) 500 MG CR tablet TAKE 2 TABLETS AT BEDTIME 10/29/19   [name], [name]  pantoprazole (PROTONIX) 40 MG tablet Take 1 tablet (40 mg total) by mouth daily. 06/25/19   [name], [name]  Polyvinyl Alcohol-Povidone (REFRESH OP) Apply 1 drop to eye 2 (two) times daily.    [provider]  pramoxine-hydrocortisone (PRAMOSONE) cream Apply topically 3 (three) times daily. Apply topically to hemorrhoids four times  daily as needed after bowel movements, for external use only    [provider]  PRESCRIPTION MEDICATION Place 1 drop into both eyes 2 (two) times daily. For glucoma    [provider]  REFRESH PLUS 0.5 % SOLN Apply 1 drop to eye 2 (two) times daily. 12/24/18   [provider]  rosuvastatin (CRESTOR) 10 MG tablet Take 1 tablet (10 mg total) by mouth daily. Patient taking differently: Take 10 mg by mouth once a week.  12/17/18   [name], [name]  Semaglutide, [name], (OZEMPIC, [name],) 2 MG/1.5ML SOPN Inject 1 mg into the skin once a week. 09/24/19   [name], [name]    Physical Exam: Vitals:   11/02/19 1915 11/02/19 1930 11/02/19 2000 11/02/19 2100  BP:  (!) 115/52 112/64 (!) 129/57  Pulse:  (!) 102 98 94  Resp:  (!) 21 18 (!) 22  Temp:      TempSrc:      SpO2:  94% 96% 96%  Weight: 129.3 kg     Height: $Remove'6\' 1"'yriRHYY$  (1.854 m)       Physical Exam  Constitutional: He is oriented to person, place, and time. He appears well-developed and well-nourished. No distress.  HENT:  Head: Normocephalic.  Eyes: Right eye exhibits no discharge. Left eye exhibits no discharge.  Cardiovascular: Normal rate, regular rhythm and intact distal pulses.  Pulmonary/Chest: Effort normal and breath sounds normal. No respiratory distress. He has no wheezes. He has no rales.  Abdominal: Soft. Bowel sounds are normal. He exhibits no distension. There is no abdominal tenderness. There is no guarding.  Musculoskeletal:        General: Edema present.     Cervical back: Neck supple.     Comments: +4 pitting edema of the left lower extremity Right BKA  Neurological: He is alert and oriented to person, place, and time.  Skin: Skin is warm and dry. He is not diaphoretic.  Left great toe appears swollen with an ulcer on the dorsum with no obvious purulent drainage and very mild surrounding erythema.    Labs on Admission: I have personally reviewed following labs and imaging  studies  CBC: Recent Labs  Lab 11/02/19 1900  WBC 18.9*  NEUTROABS 17.2*  HGB 11.2*  HCT 35.1*  MCV 99.4  PLT 765*   Basic Metabolic Panel: Recent Labs  Lab 11/02/19 1900  NA 138  K 5.2*  CL 105  CO2 21*  GLUCOSE 222*  BUN 62*  CREATININE 3.71*  CALCIUM 9.9   GFR: Estimated Creatinine Clearance: 21.6 mL/min (A) (by C-G formula based on SCr of 3.71 mg/dL (H)). Liver Function Tests: Recent Labs  Lab 11/02/19 1900  AST 44*  ALT 23  ALKPHOS 47  BILITOT 0.9  PROT 6.1*  ALBUMIN 3.0*   No results for input(s): LIPASE, AMYLASE in the last 168 hours. No results for input(s): AMMONIA in the last 168 hours. Coagulation Profile: Recent Labs  Lab 11/02/19 1900  INR 1.2   Cardiac Enzymes: No results for input(s): CKTOTAL, CKMB, CKMBINDEX, TROPONINI in the last 168 hours. BNP (last 3 results) No results for input(s): PROBNP in the last 8760 hours. HbA1C: No results for input(s): HGBA1C in the last 72 hours. CBG: Recent Labs  Lab 11/02/19 2144  GLUCAP 174*   Lipid Profile: No results for input(s): CHOL, HDL, LDLCALC, TRIG, CHOLHDL, LDLDIRECT in the last 72 hours. Thyroid Function Tests: No results for input(s): TSH, T4TOTAL, FREET4, T3FREE, THYROIDAB in the last 72 hours. Anemia Panel: No results for input(s): VITAMINB12, FOLATE, FERRITIN, TIBC, IRON, RETICCTPCT in the last 72 hours. Urine analysis:    Component Value Date/Time   COLORURINE YELLOW 01/08/2011 1558   APPEARANCEUR CLEAR 01/08/2011 1558   LABSPEC 1.018 01/08/2011 1558   PHURINE 5.5 01/08/2011 1558   GLUCOSEU 250 (A) 01/08/2011 1558   HGBUR NEGATIVE 01/08/2011 1558   BILIRUBINUR negative 05/15/2019 1029   KETONESUR NEGATIVE 01/08/2011 1558   PROTEINUR Positive (A) 05/15/2019 1029   PROTEINUR NEGATIVE 01/08/2011 1558   UROBILINOGEN 0.2 05/15/2019 1029   UROBILINOGEN 0.2 01/08/2011 1558   NITRITE negative 05/15/2019 1029   NITRITE NEGATIVE 01/08/2011 1558   LEUKOCYTESUR Negative 05/15/2019  1029    Radiological Exams on Admission: DG Chest [facility]  Result Date: 11/02/2019 CLINICAL DATA:  Fever. EXAM: PORTABLE CHEST 1 VIEW COMPARISON:  January 09, 2011 FINDINGS: There is a patchy airspace opacity involving the medial right lung base. There is no pneumothorax. No large pleural effusion. The heart size is stable from prior study. There is no acute osseous abnormality. Aortic calcifications are noted. IMPRESSION: Patchy airspace opacity involving the medial right lung base, suspicious for pneumonia. Electronically Signed   By: Constance Holster M.D.   On: 11/02/2019 19:59   DG Foot Complete Left  Result Date: 11/02/2019 CLINICAL DATA:  Left foot and left ankle swelling. EXAM: LEFT FOOT - COMPLETE 3+ VIEW COMPARISON:  None. FINDINGS: There is no evidence of acute fracture or dislocation. Moderate severity degenerative changes seen along the dorsal aspect of the mid left foot. A moderate to large plantar calcaneal spur is seen. Marked severity vascular calcification is noted. Mild to moderate severity diffuse soft tissue swelling is seen. IMPRESSION: Moderate severity degenerative changes, with diffuse soft tissue swelling. Electronically Signed   By: Virgina Norfolk M.D.   On: 11/02/2019 20:21    EKG: Independently reviewed.  Sinus or ectopic atrial tachycardia.  Rate increased compared to prior tracing.  Assessment/Plan Principal Problem:   Aspiration pneumonia (HCC) Active Problems:   Severe sepsis (HCC)   AKI (acute kidney injury) ([place])   Cellulitis   Generalized weakness   Severe sepsis secondary to suspected aspiration pneumonia: Febrile, slightly tachycardic, and tachypneic on arrival.  Hypotensive with EMS.  Now improved after a total of 2 L fluid boluses.  Labs showing leukocytosis and lactic acidosis.  Chest x-ray showing patchy airspace opacity involving the medial right lung base suspicious for pneumonia.  Concern for aspiration as patient reports vomiting.  SARS-CoV-2  PCR test negative.  No increased work of breathing on exam at present and not hypoxic. -Continue antibiotic coverage with Unasyn.  Tylenol as needed for fevers.  Check procalcitonin level.  Blood cultures pending.  Continue to monitor WBC count and trend lactate.  Continuous pulse ox, supplemental oxygen as needed.  Mild cellulitis of the left great toe: On exam, has left great toe appears swollen with an ulcer on the dorsum with no obvious purulent drainage and very mild surrounding erythema.  X-ray showing soft tissue swelling without evidence of osteomyelitis.  Feel severe sepsis is most likely due to pneumonia. -Continue Unasyn  AKI on CKD stage III: Likely prerenal due to severe sepsis/hypotension. BUN 62, creatinine 3.7.  Creatinine previously ranging between 1.9-2.4. -Patient received 2 L fluid boluses. Will avoid giving additional fluid at this time given significant peripheral edema.  Repeat BMP in a.m. to assess renal function and order additional fluid if needed.  Avoid nephrotoxic agents/contrast.  Monitor urine output.  Check urine sodium and creatinine.  Order renal ultrasound. ADDENDUM: On Lasix and losartan at home which is likely contributing.  Hold at this time.  OSA: Continue CPAP at night  Well-controlled insulin-dependent diabetes: A1c 6.9 on 09/24/2019 -Order sliding scale insulin and CBG checks.  Resume home basal insulin after pharmacy med rec is done.  Generalized weakness: Suspect related to acute illness. -PT evaluation  Emesis: Patient reports 3 episodes of emesis since yesterday.  No abdominal pain or diarrhea.  Abdominal exam benign.  No significant elevation of LFTs.  He has not vomited since he has been in the ED and denies any nausea at present. -Check lipase level.  Antiemetic as needed.  Chronic diastolic CHF: Lungs clear on exam.  Status post right BKA  and has significant pitting edema of the left lower extremity which is also likely related to chronic venous  stasis/immobility. -Hold giving diuretic at this time in the setting of severe sepsis/hypotension/AKI  Pharmacy med rec pending.  DVT prophylaxis: Subcutaneous heparin Code Status: Patient wishes to be full code. Family Communication: No family available at this time. Disposition Plan: Status is: Inpatient  Remains inpatient appropriate because:IV treatments appropriate due to intensity of illness or inability to take PO and Inpatient level of care appropriate due to severity of illness   Dispo: The patient is from: Home              Anticipated d/c is to: SNF              Anticipated d/c date is: 3 days              Patient currently is not medically stable to d/c.  The medical decision making on this patient was of high complexity and the patient is at high risk for clinical deterioration, therefore this is a level 3 visit.  Shela Leff MD [facility]  If 7PM-7AM, please contact night-coverage [email or web address]  11/02/2019, 10:37 PM

## 2019-11-02 NOTE — ED Notes (Signed)
Unable to obtain second set of cultures. MD notified.

## 2019-11-02 NOTE — ED Triage Notes (Signed)
EMS called  To Pt home because Pt was to weak to get off toilet . Pt reports several days of loose stools and chills. Pt uses WC but is able to transfer indepedently from W/C to bed. EMS reported Pt initial BP was 82/48. EMS inserted 18 g. IV and gave 513ml NS IV. BP rechecked  102/64  ,CBG 212 and Temp 100.3

## 2019-11-03 ENCOUNTER — Encounter (HOSPITAL_COMMUNITY): Payer: Self-pay | Admitting: Internal Medicine

## 2019-11-03 DIAGNOSIS — L0390 Cellulitis, unspecified: Secondary | ICD-10-CM

## 2019-11-03 LAB — PROCALCITONIN: Procalcitonin: 68.6 ng/mL

## 2019-11-03 LAB — CBC
HCT: 33.1 % — ABNORMAL LOW (ref 39.0–52.0)
Hemoglobin: 10.7 g/dL — ABNORMAL LOW (ref 13.0–17.0)
MCH: 31.5 pg (ref 26.0–34.0)
MCHC: 32.3 g/dL (ref 30.0–36.0)
MCV: 97.4 fL (ref 80.0–100.0)
Platelets: 125 10*3/uL — ABNORMAL LOW (ref 150–400)
RBC: 3.4 MIL/uL — ABNORMAL LOW (ref 4.22–5.81)
RDW: 15.8 % — ABNORMAL HIGH (ref 11.5–15.5)
WBC: 17.8 10*3/uL — ABNORMAL HIGH (ref 4.0–10.5)
nRBC: 0 % (ref 0.0–0.2)

## 2019-11-03 LAB — BASIC METABOLIC PANEL
Anion gap: 8 (ref 5–15)
BUN: 64 mg/dL — ABNORMAL HIGH (ref 8–23)
CO2: 23 mmol/L (ref 22–32)
Calcium: 9.3 mg/dL (ref 8.9–10.3)
Chloride: 110 mmol/L (ref 98–111)
Creatinine, Ser: 3.16 mg/dL — ABNORMAL HIGH (ref 0.61–1.24)
GFR calc Af Amer: 20 mL/min — ABNORMAL LOW (ref >60)
GFR calc non Af Amer: 17 mL/min — ABNORMAL LOW (ref >60)
Glucose, Bld: 151 mg/dL — ABNORMAL HIGH (ref 70–99)
Potassium: 3.6 mmol/L (ref 3.5–5.1)
Sodium: 141 mmol/L (ref 135–145)

## 2019-11-03 LAB — GLUCOSE, CAPILLARY
Glucose-Capillary: 160 mg/dL — ABNORMAL HIGH (ref 70–99)
Glucose-Capillary: 140 mg/dL — ABNORMAL HIGH (ref 70–99)
Glucose-Capillary: 122 mg/dL — ABNORMAL HIGH (ref 70–99)
Glucose-Capillary: 116 mg/dL — ABNORMAL HIGH (ref 70–99)

## 2019-11-03 LAB — LIPASE, BLOOD: Lipase: 27 U/L (ref 11–51)

## 2019-11-03 MED ORDER — ASPIRIN 81 MG PO CHEW
162.0000 mg | CHEWABLE_TABLET | Freq: Every day | ORAL | Status: DC
  Administered 2019-11-03 – 2019-11-08 (×6): 162 mg via ORAL
  Filled 2019-11-03 (×7): qty 2

## 2019-11-03 MED ORDER — GABAPENTIN 100 MG PO CAPS
100.0000 mg | ORAL_CAPSULE | Freq: Three times a day (TID) | ORAL | Status: DC
  Administered 2019-11-03 – 2019-11-08 (×15): 100 mg via ORAL
  Filled 2019-11-03 (×14): qty 1

## 2019-11-03 MED ORDER — DORZOLAMIDE HCL 2 % OP SOLN
1.0000 [drp] | Freq: Two times a day (BID) | OPHTHALMIC | Status: DC
  Administered 2019-11-04 – 2019-11-08 (×9): 1 [drp] via OPHTHALMIC
  Filled 2019-11-03: qty 10

## 2019-11-03 MED ORDER — LATANOPROST 0.005 % OP SOLN
1.0000 [drp] | Freq: Every day | OPHTHALMIC | Status: DC
  Administered 2019-11-04 – 2019-11-07 (×4): 1 [drp] via OPHTHALMIC
  Filled 2019-11-03: qty 2.5

## 2019-11-03 MED ORDER — ROSUVASTATIN CALCIUM 5 MG PO TABS
10.0000 mg | ORAL_TABLET | Freq: Every day | ORAL | Status: DC
  Administered 2019-11-03 – 2019-11-08 (×6): 10 mg via ORAL
  Filled 2019-11-03 (×7): qty 2

## 2019-11-03 MED ORDER — DILTIAZEM HCL ER COATED BEADS 180 MG PO CP24
300.0000 mg | ORAL_CAPSULE | Freq: Every day | ORAL | Status: DC
  Administered 2019-11-04 – 2019-11-08 (×5): 300 mg via ORAL
  Filled 2019-11-03 (×7): qty 1

## 2019-11-03 MED ORDER — PANTOPRAZOLE SODIUM 40 MG PO TBEC
40.0000 mg | DELAYED_RELEASE_TABLET | Freq: Every day | ORAL | Status: DC
  Administered 2019-11-03 – 2019-11-08 (×6): 40 mg via ORAL
  Filled 2019-11-03 (×6): qty 1

## 2019-11-03 MED ORDER — ALLOPURINOL 100 MG PO TABS
100.0000 mg | ORAL_TABLET | Freq: Every day | ORAL | Status: DC
  Administered 2019-11-03 – 2019-11-08 (×6): 100 mg via ORAL
  Filled 2019-11-03 (×6): qty 1

## 2019-11-03 MED ORDER — OMEGA-3-ACID ETHYL ESTERS 1 G PO CAPS
2.0000 g | ORAL_CAPSULE | Freq: Every day | ORAL | Status: DC
  Administered 2019-11-03 – 2019-11-08 (×6): 2 g via ORAL
  Filled 2019-11-03 (×6): qty 2

## 2019-11-03 NOTE — Progress Notes (Signed)
RT placed patient on CPAP. Patient tolerating well at this time. RT will monitor as needed. 

## 2019-11-03 NOTE — NC FL2 (Signed)
[place] MEDICAID FL2 LEVEL OF CARE SCREENING TOOL     IDENTIFICATION  Patient Name: Micheal Walls Birthdate: 1935-08-17 Sex: male Admission Date (Current Location): 11/02/2019  [facility] and [place] Number:  Herbalist and Address:  [facility] [place] [place] [address], [place], [place] [address]      Provider Number: [id number]  Attending Physician Name and Address:  Elgergawy, Silver Huguenin, MD  Relative Name and Phone Number:  [name]    Current Level of Care: Hospital Recommended Level of Care: [place] Prior Approval Number:    Date Approved/Denied:   PASRR Number: [id number]  Discharge Plan: SNF    Current Diagnoses: Patient Active Problem List   Diagnosis Date Noted  . Aspiration pneumonia ([place]) 11/02/2019  . Severe sepsis ([place]) 11/02/2019  . AKI (acute kidney injury) ([place]) 11/02/2019  . Cellulitis 11/02/2019  . Generalized weakness 11/02/2019  . Elevated TSH 07/10/2018  . Nephropathy 02/28/2018  . Disturbance of skin sensation 02/28/2018  . PAD (peripheral artery disease) ([place]) 07/17/2017  . 110.1 10/21/2013  . Epiphora 04/24/2013  . Laxity of eyelid 04/24/2013  . Anemia 04/09/2013  . History of peripheral vascular disease 04/09/2013  . History of stroke 04/09/2013  . S/P unilateral BKA (below knee amputation) ([place]) 03/17/2013  . Diabetes mellitus type 2 in obese ([place]) 03/17/2013  . Mixed hyperlipidemia 03/17/2013  . Essential hypertension 03/17/2013  . Gout 03/17/2013  . CKD (chronic kidney disease) stage 3, GFR 30-59 ml/min 03/17/2013  . OSA on CPAP 03/17/2013  . Morbid obesity ([place]) 03/17/2013  . Anemia, iron deficiency 03/17/2013  . Diverticulosis of colon 03/17/2013  . RBBB 03/17/2013  . Punctal stenosis, acquired 10/02/2012    Orientation RESPIRATION BLADDER Height & Weight     Self, Time, Situation, Place  Normal Continent Weight: 285 lb (129.3 kg) Height:  $Remove'6\' 1"'FSHjOBR$  (185.4 cm)  BEHAVIORAL  SYMPTOMS/MOOD NEUROLOGICAL BOWEL NUTRITION STATUS      Continent Diet(See discharge summary)  AMBULATORY STATUS COMMUNICATION OF NEEDS Skin   Extensive Assist Verbally Normal                       Personal Care Assistance Level of Assistance  Bathing, Dressing, Feeding Bathing Assistance: Limited assistance Feeding assistance: Limited assistance Dressing Assistance: Limited assistance     Functional Limitations Info  Sight, Hearing, Speech Sight Info: Adequate Hearing Info: Impaired Speech Info: Adequate    SPECIAL CARE FACTORS FREQUENCY  PT (By licensed PT), OT (By licensed OT)     PT Frequency: 5x a week OT Frequency: 5x a week            Contractures Contractures Info: Not present    Additional Factors Info  Code Status, Allergies Code Status Info: Full Allergies Info: NKA           Current Medications (11/03/2019):  This is the current hospital active medication list Current Facility-Administered Medications  Medication Dose Route Frequency Provider Last Rate Last Admin  . acetaminophen (TYLENOL) tablet 650 mg  650 mg Oral Q6H PRN Shela Leff, MD       Or  . acetaminophen (TYLENOL) suppository 650 mg  650 mg Rectal Q6H PRN Shela Leff, MD      . allopurinol (ZYLOPRIM) tablet 100 mg  100 mg Oral Daily Elgergawy, Silver Huguenin, MD   100 mg at 11/03/19 1719  . Ampicillin-Sulbactam (UNASYN) 3 g in sodium chloride 0.9 % 100 mL IVPB  3 g  Intravenous Q12H Shela Leff, MD 200 mL/hr at 11/03/19 1230 3 g at 11/03/19 1230  . aspirin chewable tablet 162 mg  162 mg Oral Daily Elgergawy, Silver Huguenin, MD   162 mg at 11/03/19 1719  . [START ON 11/04/2019] diltiazem (CARDIZEM CD) 24 hr capsule 300 mg  300 mg Oral Daily Elgergawy, Dawood S, MD      . dorzolamide (TRUSOPT) 2 % ophthalmic solution 1 drop  1 drop Both Eyes BID Elgergawy, Silver Huguenin, MD      . gabapentin (NEURONTIN) capsule 100 mg  100 mg Oral TID Elgergawy, Silver Huguenin, MD   100 mg at 11/03/19 1719  .  heparin injection 5,000 Units  5,000 Units Subcutaneous Q8H Shela Leff, MD   5,000 Units at 11/03/19 1231  . insulin aspart (novoLOG) injection 0-15 Units  0-15 Units Subcutaneous TID WC Shela Leff, MD   3 Units at 11/03/19 1103  . insulin aspart (novoLOG) injection 0-5 Units  0-5 Units Subcutaneous QHS Shela Leff, MD      . latanoprost (XALATAN) 0.005 % ophthalmic solution 1 drop  1 drop Both Eyes QHS Elgergawy, Dawood S, MD      . omega-3 acid ethyl esters (LOVAZA) capsule 2 g  2 g Oral Daily Elgergawy, Silver Huguenin, MD   2 g at 11/03/19 1720  . ondansetron (ZOFRAN) injection 4 mg  4 mg Intravenous Q6H PRN Shela Leff, MD      . pantoprazole (PROTONIX) EC tablet 40 mg  40 mg Oral Daily Elgergawy, Silver Huguenin, MD   40 mg at 11/03/19 1719  . rosuvastatin (CRESTOR) tablet 10 mg  10 mg Oral Daily Elgergawy, Silver Huguenin, MD   10 mg at 11/03/19 1719     Discharge Medications: Please see discharge summary for a list of discharge medications.  Relevant Imaging Results:  Relevant Lab Results:   Additional Information SSN [id number]  Neysa Hotter [place], [place]

## 2019-11-03 NOTE — Progress Notes (Signed)
PROGRESS NOTE                                                                                                                                                                                                             Patient Demographics:    Micheal Walls, is a 84 y.o. male, DOB - [birth year], JIZ:[id number]  Admit date - 11/02/2019   Admitting Physician Shela Leff, MD  Outpatient Primary MD for the patient is [name], [place]  LOS - 1   Chief Complaint  Patient presents with  . Weakness       Brief Narrative    Micheal Walls is a 84 y.o. 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  His blood pressure was 82/48 with EMS.  Improved with a 500 cc fluid bolus.  Febrile with temperature 100.3 F.  Patient states he has been having chills since last night.  He vomited twice yesterday evening and once this morning.  No abdominal pain or diarrhea.  Today while in the bathroom he was so weak that he fell.  States his son was there at that time and he did not fall hard or injure himself.  Denies hitting his head or sustaining any other injuries from the fall.  Reports having a chronic cough.  Denies shortness of breath or chest pain.  States he has had an infection of his left big toe for a month for which he is seen by podiatry.  Patient states he has received both doses of his Covid vaccine.  ED Course: Blood pressure 113/54 on arrival to the ED.  T-max 100.7 F.  Slightly tachycardic and tachypneic.  Labs showing leukocytosis with WBC count 18.9.  Lactic acid 4.4.  Hemoglobin 11.2, stable compared to prior labs.  BUN 62, creatinine 3.7.  Creatinine previously ranging between 1.9-2.4.  UA and urine  culture pending.  Blood cultures pending.  SARS-CoV-2 PCR test pending.  Left big toe noted to have an area of ulceration with some swelling.  This is felt to be a possible source of infection.  X-ray showing soft tissue swelling but no evidence of osteomyelitis.  Chest x-ray showing  patchy airspace opacity involving the medial right lung base suspicious for pneumonia.   Patient was given ceftriaxone, azithromycin, 1.5 L fluid boluses, and Tylenol.  Blood pressure improved to 112/64 after fluid boluses   Subjective:    Micheal Walls today has, No headache, No chest pain, No abdominal pain - No Nausea, he does report some generalized weakness and fatigue, as well he reports cough but overall this is more chronic than acute, he does report dyspnea .   Assessment  & Plan :    Principal Problem:   Aspiration pneumonia (HCC) Active Problems:   Severe sepsis (HCC)   AKI (acute kidney injury) (HCC)   Cellulitis   Generalized weakness  Severe sepsis secondary to suspected aspiration pneumonia:  - Febrile, slightly tachycardic, and tachypneic on arrival.  Hypotensive with EMS.  improved after a total of 2 L fluid boluses.  Labs showing leukocytosis and lactic acidosis.  Chest x-ray showing patchy airspace opacity involving the medial right lung base suspicious for pneumonia.  -Given its right lung opacity, concern for aspiration pneumonia even though patient denies any dysphagia or aspiration symptoms, will continue with IV Unasyn for now given his sepsis. - SARS-CoV-2 PCR test negative.    Mild cellulitis of the left great toe:  -Used to be mild, x-ray showing no evidence of osteomyelitis, should be covered with current regimen .  AKI on CKD stage III:  -Likely prerenal due to severe sepsis/hypotension. BUN 62, creatinine 3.7.  Creatinine previously ranging between 1.9-2.4. -Has improved with IV fluids, -Continue to hold Lasix and losartan.  OSA: Continue CPAP at  night  Well-controlled insulin-dependent diabetes: A1c 6.9 on 09/24/2019 -on  sliding scale insulin and CBG checks.  Resume home basal insulin after pharmacy med rec is done.  Generalized weakness: Suspect related to acute illness. -PT evaluation  Emesis:  -Patient reports 3 episodes of emesis since yesterday.  No abdominal pain or diarrhea.  Abdominal exam benign.  No significant elevation of LFTs.,  No recurrence since admission, continue with as needed antiemetics.  Chronic diastolic CHF: Lungs clear on exam.  Status post right BKA and has significant pitting edema of the left lower extremity which is also likely related to chronic venous stasis/immobility. -Hold giving diuretic at this time in the setting of severe sepsis/hypotension/AKI  Still awaiting medication reconciliation to be done to resume home meds  COVID-19 Labs  No results for input(s): DDIMER, FERRITIN, LDH, CRP in the last 72 hours.  Lab Results  Component Value Date   SARSCOV2NAA NEGATIVE 11/02/2019     Code Status : Full  Family Communication  : None at bedside  Disposition Plan  :  Status is: Inpatient  Remains inpatient appropriate because:IV treatments appropriate due to intensity of illness or inability to take PO   Dispo: The patient is from: SNF              Anticipated d/c is to: SNF              Anticipated d/c date is: 3 days              Patient currently is not medically stable to d/c.         Consults  :  None  Procedures  : None  DVT Prophylaxis  :  [place]  Lab Results  Component Value Date   PLT 125 (L) 11/03/2019    Antibiotics  :    Anti-infectives (From admission, onward)   Start  Dose/Rate Route Frequency Ordered Stop   11/03/19 1915  azithromycin (ZITHROMAX) 500 mg in sodium chloride 0.9 % 250 mL IVPB  Status:  Discontinued     500 mg 250 mL/hr over 60 Minutes Intravenous Every 24 hours 11/02/19 2139 11/02/19 2208   11/03/19 1915  cefTRIAXone  (ROCEPHIN) 2 g in sodium chloride 0.9 % 100 mL IVPB  Status:  Discontinued     2 g 200 mL/hr over 30 Minutes Intravenous Every 24 hours 11/02/19 2139 11/02/19 2208   11/02/19 2300  Ampicillin-Sulbactam (UNASYN) 3 g in sodium chloride 0.9 % 100 mL IVPB     3 g 200 mL/hr over 30 Minutes Intravenous Every 12 hours 11/02/19 2223     11/02/19 2100  linezolid (ZYVOX) IVPB 600 mg  Status:  Discontinued     600 mg 300 mL/hr over 60 Minutes Intravenous Every 12 hours 11/02/19 2046 11/02/19 2125   11/02/19 1915  azithromycin (ZITHROMAX) 500 mg in sodium chloride 0.9 % 250 mL IVPB     500 mg 250 mL/hr over 60 Minutes Intravenous  Once 11/02/19 1907 11/02/19 2052   11/02/19 1915  cefTRIAXone (ROCEPHIN) 1 g in sodium chloride 0.9 % 100 mL IVPB     1 g 200 mL/hr over 30 Minutes Intravenous  Once 11/02/19 1907 11/02/19 2005        Objective:   Vitals:   11/02/19 2300 11/02/19 2338 11/03/19 0019 11/03/19 0728  BP: 118/61 (!) 117/57  138/62  Pulse: 92 88 86 90  Resp: $Remo'14 16 16 18  'TKETZ$ Temp:  98.3 F (36.8 C)  97.6 F (36.4 C)  TempSrc:  Oral    SpO2: 96% 97% 95%   Weight:      Height:        Wt Readings from Last 3 Encounters:  11/02/19 129.3 kg  10/31/19 129.3 kg  09/24/19 129.3 kg     Intake/Output Summary (Last 24 hours) at 11/03/2019 1331 Last data filed at 11/03/2019 0300 Gross per 24 hour  Intake 1650 ml  Output --  Net 1650 ml     Physical Exam  Awake Alert, Oriented X 3, No new F.N deficits, Normal affect Symmetrical Chest wall movement, Good air movement bilaterally, CTAB RRR,No Gallops,Rubs or new Murmurs, No Parasternal Heave +ve B.Sounds, Abd Soft, No tenderness, No rebound - guarding or rigidity. No Cyanosis, Clubbing, right BKA, Left great toe mild erythema.    Data Review:    CBC Recent Labs  Lab 11/02/19 1900 11/03/19 0625  WBC 18.9* 17.8*  HGB 11.2* 10.7*  HCT 35.1* 33.1*  PLT 146* 125*  MCV 99.4 97.4  MCH 31.7 31.5  MCHC 31.9 32.3  RDW 15.9* 15.8*   LYMPHSABS 0.6*  --   MONOABS 0.8  --   EOSABS 0.0  --   BASOSABS 0.0  --     Chemistries  Recent Labs  Lab 11/02/19 1900 11/03/19 0625  NA 138 141  K 5.2* 3.6  CL 105 110  CO2 21* 23  GLUCOSE 222* 151*  BUN 62* 64*  CREATININE 3.71* 3.16*  CALCIUM 9.9 9.3  AST 44*  --   ALT 23  --   ALKPHOS 47  --   BILITOT 0.9  --    ------------------------------------------------------------------------------------------------------------------ No results for input(s): CHOL, HDL, LDLCALC, TRIG, CHOLHDL, LDLDIRECT in the last 72 hours.  Lab Results  Component Value Date   HGBA1C 6.9 (H) 09/24/2019   ------------------------------------------------------------------------------------------------------------------ No results for input(s): TSH, T4TOTAL, T3FREE, THYROIDAB in the last  72 hours.  Invalid input(s): FREET3 ------------------------------------------------------------------------------------------------------------------ No results for input(s): VITAMINB12, FOLATE, FERRITIN, TIBC, IRON, RETICCTPCT in the last 72 hours.  Coagulation profile Recent Labs  Lab 11/02/19 1900  INR 1.2    No results for input(s): DDIMER in the last 72 hours.  Cardiac Enzymes No results for input(s): CKMB, TROPONINI, MYOGLOBIN in the last 168 hours.  Invalid input(s): CK ------------------------------------------------------------------------------------------------------------------ No results found for: BNP  Inpatient Medications  Scheduled Meds: . heparin  5,000 Units Subcutaneous Q8H  . insulin aspart  0-15 Units Subcutaneous TID WC  . insulin aspart  0-5 Units Subcutaneous QHS   Continuous Infusions: . ampicillin-sulbactam (UNASYN) IV 3 g (11/03/19 1230)   PRN Meds:.acetaminophen **OR** acetaminophen, ondansetron (ZOFRAN) IV  Micro Results Recent Results (from the past 240 hour(s))  Blood Culture (routine x 2)     Status: None (Preliminary result)   Collection Time:  11/02/19  7:00 PM   Specimen: BLOOD RIGHT HAND  Result Value Ref Range Status   Specimen Description BLOOD RIGHT HAND  Final   Special Requests   Final    BOTTLES DRAWN AEROBIC AND ANAEROBIC Blood Culture results may not be optimal due to an excessive volume of blood received in culture bottles   Culture   Final    NO GROWTH < 24 HOURS Performed at [facility], [place] [address], [place], [place] [address]    Report Status PENDING  Incomplete  SARS Coronavirus 2 by RT PCR (hospital order, performed in [facility] hospital lab) Nasopharyngeal Nasopharyngeal Swab     Status: None   Collection Time: 11/02/19  8:03 PM   Specimen: Nasopharyngeal Swab  Result Value Ref Range Status   SARS Coronavirus 2 NEGATIVE NEGATIVE Final    Comment: (NOTE) SARS-CoV-2 target nucleic acids are NOT DETECTED. The SARS-CoV-2 RNA is generally detectable in upper and lower respiratory specimens during the acute phase of infection. The lowest concentration of SARS-CoV-2 viral copies this assay can detect is 250 copies / mL. A negative result does not preclude SARS-CoV-2 infection and should not be used as the sole basis for treatment or other patient management decisions.  A negative result may occur with improper specimen collection / handling, submission of specimen other than nasopharyngeal swab, presence of viral mutation(s) within the areas targeted by this assay, and inadequate number of viral copies (<250 copies / mL). A negative result must be combined with clinical observations, patient history, and epidemiological information. Fact Sheet for Patients:   StrictlyIdeas.no Fact Sheet for Healthcare Providers: BankingDealers.co.za This test is not yet approved or cleared  by the [place] FDA and has been authorized for detection and/or diagnosis of SARS-CoV-2 by FDA under an Emergency Use Authorization (EUA).  This EUA will remain in effect  (meaning this test can be used) for the duration of the COVID-19 declaration under Section 564(b)(1) of the Act, 21 U.S.C. section 360bbb-3(b)(1), unless the authorization is terminated or revoked sooner. Performed at [facility], [place] [address], [place], [place] [address]   Blood Culture (routine x 2)     Status: None (Preliminary result)   Collection Time: 11/02/19 10:05 PM   Specimen: BLOOD  Result Value Ref Range Status   Specimen Description BLOOD SITE NOT SPECIFIED  Final   Special Requests   Final    BOTTLES DRAWN AEROBIC AND ANAEROBIC Blood Culture adequate volume   Culture   Final    NO GROWTH < 24 HOURS Performed at [facility],  [address], [place], [place] [address]    Report Status PENDING  Incomplete    Radiology Reports US RENAL  Result Date: 11/02/2019 CLINICAL DATA:  Initial evaluation for acute renal insufficiency. EXAM: RENAL / URINARY TRACT ULTRASOUND COMPLETE COMPARISON:  Prior CT from 01/12/2011. FINDINGS: Right Kidney: Renal measurements: 10.8 x 5.9 x 6.1 cm = volume: 200.5 mL. Mild diffuse cortical thinning seen about the right kidney. Renal echogenicity mildly increased. No nephrolithiasis or hydronephrosis. No focal renal mass. Left Kidney: Renal measurements: 12.0 x 6.3 x 4.5 cm = volume: 175.8 mL. Diffuse cortical thinning with mildly increased echogenicity within the renal parenchyma. No nephrolithiasis or hydronephrosis. No focal renal mass. Bladder: Small amount of layering echogenic debris noted within the bladder lumen. Other: None. IMPRESSION: 1. Diffuse cortical thinning with mildly increased echogenicity within the renal parenchyma, consistent with chronic medical renal disease. 2. No hydronephrosis. 3. Small amount of layering echogenic material/debris within the bladder lumen. Correlation with urinalysis recommended. Electronically Signed   By: Jeannine Boga M.D.   On: 11/02/2019 22:45   DG Chest Port 1 View  Result Date:  11/02/2019 CLINICAL DATA:  Fever. EXAM: PORTABLE CHEST 1 VIEW COMPARISON:  January 09, 2011 FINDINGS: There is a patchy airspace opacity involving the medial right lung base. There is no pneumothorax. No large pleural effusion. The heart size is stable from prior study. There is no acute osseous abnormality. Aortic calcifications are noted. IMPRESSION: Patchy airspace opacity involving the medial right lung base, suspicious for pneumonia. Electronically Signed   By: Constance Holster M.D.   On: 11/02/2019 19:59   DG Foot Complete Left  Result Date: 11/02/2019 CLINICAL DATA:  Left foot and left ankle swelling. EXAM: LEFT FOOT - COMPLETE 3+ VIEW COMPARISON:  None. FINDINGS: There is no evidence of acute fracture or dislocation. Moderate severity degenerative changes seen along the dorsal aspect of the mid left foot. A moderate to large plantar calcaneal spur is seen. Marked severity vascular calcification is noted. Mild to moderate severity diffuse soft tissue swelling is seen. IMPRESSION: Moderate severity degenerative changes, with diffuse soft tissue swelling. Electronically Signed   By: Virgina Norfolk M.D.   On: 11/02/2019 20:21     Phillips Climes M.D on 11/03/2019 at 1:31 PM  Between 7am to 7pm - Pager - [phone]  After 7pm go to [email or web address] - password [facility]  [facility] -  Office  [phone]

## 2019-11-03 NOTE — TOC Initial Note (Signed)
Transition of Care [facility]) - Initial/Assessment Note    Patient Details  Name: Micheal Walls MRN: [id number] Date of Birth: 1936-02-22  Transition of Care [facility] CM/SW Contact:    Jacquelynn Cree Phone Number: 11/03/2019, 5:40 PM  Clinical Narrative:                 CSW received consult for possible SNF placement at time of discharge. CSW met bedside with patient and his son Tannor Pyon. who was also present. Patient provided permission to speak with his son and daughter [name]. Patient's son contacted patient's daughter on speaker phone.   CSW went over PT recommendation with daughter. Daughter expressed she believes that is the best option for patient and agreed to have patient faxed out. CSW emailed daughter SNF ratings for review. TOC team will continue to follow.    Expected Discharge Plan: Skilled Nursing Facility Barriers to Discharge: Continued Medical Work up   Patient Goals and CMS Choice   CMS Medicare.gov Compare Post Acute Care list provided to:: Patient Represenative (must comment) Choice offered to / list presented to : Adult Children(Marilyn [name])  Expected Discharge Plan and Services Expected Discharge Plan: [place] In-house Referral: Clinical Social Work     Living arrangements for the past 2 months: Single Family Home                                      Prior Living Arrangements/Services Living arrangements for the past 2 months: Single Family Home Lives with:: Self Patient language and need for interpreter reviewed:: Yes Do you feel safe going back to the place where you live?: Yes      Need for Family Participation in Patient Care: No (Comment) Care giver support system in place?: Yes (comment)   Criminal Activity/Legal Involvement Pertinent to Current Situation/Hospitalization: No - Comment as needed  Activities of Daily Living Home Assistive Devices/Equipment: CPAP, Wheelchair ADL Screening (condition at  time of admission) Patient's cognitive ability adequate to safely complete daily activities?: Yes Is the patient deaf or have difficulty hearing?: Yes Does the patient have difficulty seeing, even when wearing glasses/contacts?: No Does the patient have difficulty concentrating, remembering, or making decisions?: No Patient able to express need for assistance with ADLs?: Yes Does the patient have difficulty dressing or bathing?: Yes Independently performs ADLs?: Yes (appropriate for developmental age) Does the patient have difficulty walking or climbing stairs?: Yes Weakness of Legs: None Weakness of Arms/Hands: None  Permission Sought/Granted Permission sought to share information with : Facility Sport and exercise psychologist, Family Supports Permission granted to share information with : Yes, Verbal Permission Granted  Share Information with NAME: [name]  Permission granted to share info w AGENCY: SNFs  Permission granted to share info w Relationship: Daughter  Permission granted to share info w Contact Information: [phone]  Emotional Assessment Appearance:: Appears stated age   Affect (typically observed): Unable to Assess Orientation: : Oriented to Self, Oriented to Place, Oriented to  Time, Oriented to Situation Alcohol / Substance Use: Not Applicable Psych Involvement: No (comment)  Admission diagnosis:  CAP (community acquired pneumonia) [J18.9] AKI (acute kidney injury) ([place]) [N17.9] Cellulitis, unspecified cellulitis site [L03.90] Community acquired pneumonia, unspecified laterality [J18.9] Sepsis, due to unspecified organism, unspecified whether acute organ dysfunction present [facility]) [A41.9] Patient Active Problem List   Diagnosis Date Noted  . Aspiration pneumonia ([place]) 11/02/2019  . Severe sepsis ([place])  11/02/2019  . AKI (acute kidney injury) ([place]) 11/02/2019  . Cellulitis 11/02/2019  . Generalized weakness 11/02/2019  . Elevated TSH 07/10/2018  . Nephropathy  02/28/2018  . Disturbance of skin sensation 02/28/2018  . PAD (peripheral artery disease) ([place]) 07/17/2017  . 110.1 10/21/2013  . Epiphora 04/24/2013  . Laxity of eyelid 04/24/2013  . Anemia 04/09/2013  . History of peripheral vascular disease 04/09/2013  . History of stroke 04/09/2013  . S/P unilateral BKA (below knee amputation) ([place]) 03/17/2013  . Diabetes mellitus type 2 in obese ([place]) 03/17/2013  . Mixed hyperlipidemia 03/17/2013  . Essential hypertension 03/17/2013  . Gout 03/17/2013  . CKD (chronic kidney disease) stage 3, GFR 30-59 ml/min 03/17/2013  . OSA on CPAP 03/17/2013  . Morbid obesity ([place]) 03/17/2013  . Anemia, iron deficiency 03/17/2013  . Diverticulosis of colon 03/17/2013  . RBBB 03/17/2013  . Punctal stenosis, acquired 10/02/2012   PCP:  [name], [facility]:   [place], [place] - [address] [place] [address] Phone: [phone] Fax: [phone]  [place] ([place], [place] [address] Phone: [phone] Fax: [phone]     Social Determinants of Health (SDOH) Interventions    Readmission Risk Interventions No flowsheet data found.

## 2019-11-03 NOTE — Evaluation (Signed)
Physical Therapy Evaluation Patient Details Name: Micheal Walls MRN: [id number] DOB: 1936-03-17 Today's Date: 11/03/2019   History of Present Illness  84 y.o. 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    Clinical Impression  Pt admitted with above diagnosis. PTA pt lived at home with his grandson. Pt modified independent mobility at w/c level using RLE prosthesis. On eval, he required min assist bed mobility and mod assist sit to stand. Stedy utilized for transfer bed <> BSC. Assist required EOB for don/doff RLE prosthesis. Pt presenting with deficits in strength and activity tolerance. Pt currently with functional limitations due to the deficits listed below (see PT Problem List). Pt will benefit from skilled PT to increase their independence and safety with mobility to allow discharge to the venue listed below.       Follow Up Recommendations SNF;Supervision/Assistance - 24 hour    Equipment Recommendations  None recommended by PT    Recommendations for Other Services       Precautions / Restrictions Precautions Precautions: Fall      Mobility  Bed Mobility Overal bed mobility: Needs Assistance Bed Mobility: Supine to Sit;Sit to Supine     Supine to sit: Min assist;HOB elevated Sit to supine: Min assist;HOB elevated   General bed mobility comments: +rail, assist to elevate trunk and for LLE back to bed  Transfers Overall transfer level: Needs assistance Equipment used: Rolling walker (2 wheeled) Transfers: Sit to/from Stand Sit to Stand: Mod assist         General transfer comment: Sit to stand with RW from EOB. Pt unable to safely take steps for pivot transfer. Pt, therefore, returned to sitting EOB. Stedy retrieved  and utilized for bed <> BSC transfer. Mod assist to power up to stand in stedy x 4 trials.  Ambulation/Gait             General Gait Details: nonambulatory at baseline  Stairs            Wheelchair Mobility    Modified Rankin (Stroke Patients Only)       Balance Overall balance assessment: Needs assistance Sitting-balance support: No upper extremity supported;Feet supported Sitting balance-Leahy Scale: Good     Standing balance support: Bilateral upper extremity supported;During functional activity Standing balance-Leahy Scale: Poor Standing balance comment: reliant on UE support                             Pertinent Vitals/Pain Pain Assessment: No/denies pain    Home Living Family/patient expects to be discharged to:: Private residence Living Arrangements: Other relatives(grandson) Available Help at Discharge: Family;Available PRN/intermittently Type of Home: House Home Access: Ramped entrance     Home Layout: One level Home Equipment: Wheelchair - manual;Shower seat      Prior Function Level of Independence: Independent with assistive device(s)         Comments: Independent at wheelchair level. Dons RLE prosthesis for transfers. Reports hasn't ambulated since 2002. Mod I ADLs (per pt).     Hand Dominance        Extremity/Trunk Assessment   Upper Extremity Assessment Upper Extremity Assessment: Generalized weakness    Lower Extremity Assessment Lower Extremity Assessment: Generalized weakness;RLE deficits/detail;LLE deficits/detail RLE Deficits / Details: h/o BKA, wears prosthesis LLE Deficits /  Details: pitting edema noted       Communication   Communication: HOH  Cognition Arousal/Alertness: Awake/alert Behavior During Therapy: WFL for tasks assessed/performed Overall Cognitive Status: Within Functional Limits for tasks assessed                                 General Comments: Very pleasant and  cooperative.      General Comments General comments (skin integrity, edema, etc.): max HR 111 during mobility    Exercises     Assessment/Plan    PT Assessment Patient needs continued PT services  PT Problem List Decreased strength;Decreased mobility;Decreased activity tolerance;Decreased balance       PT Treatment Interventions DME instruction;Therapeutic activities;Therapeutic exercise;Patient/family education;Balance training;Functional mobility training    PT Goals (Current goals can be found in the Care Plan section)  Acute Rehab PT Goals Patient Stated Goal: get stronger PT Goal Formulation: With patient Time For Goal Achievement: 11/17/19 Potential to Achieve Goals: Good    Frequency Min 2X/week   Barriers to discharge        Co-evaluation               AM-PAC PT "6 Clicks" Mobility  Outcome Measure Help needed turning from your back to your side while in a flat bed without using bedrails?: A Little Help needed moving from lying on your back to sitting on the side of a flat bed without using bedrails?: A Little Help needed moving to and from a bed to a chair (including a wheelchair)?: Total Help needed standing up from a chair using your arms (e.g., wheelchair or bedside chair)?: A Lot Help needed to walk in hospital room?: Total Help needed climbing 3-5 steps with a railing? : Total 6 Click Score: 11    End of Session Equipment Utilized During Treatment: Gait belt Activity Tolerance: Patient tolerated treatment well Patient left: in bed;with call bell/phone within reach;with bed alarm set Nurse Communication: Mobility status;Need for lift equipment PT Visit Diagnosis: Other abnormalities of gait and mobility (R26.89);Muscle weakness (generalized) (M62.81)    Time: 5436-0677 PT Time Calculation (min) (ACUTE ONLY): 41 min   Charges:   PT Evaluation $PT Eval Moderate Complexity: 1 Mod PT Treatments $Therapeutic Activity: 23-37 mins         Lorrin Goodell, PT  Office # [phone] Pager [phone]   Lorriane Shire 11/03/2019, 10:44 AM

## 2019-11-04 ENCOUNTER — Telehealth: Payer: Self-pay | Admitting: Podiatry

## 2019-11-04 LAB — BASIC METABOLIC PANEL
Anion gap: 10 (ref 5–15)
BUN: 54 mg/dL — ABNORMAL HIGH (ref 8–23)
CO2: 24 mmol/L (ref 22–32)
Calcium: 9.9 mg/dL (ref 8.9–10.3)
Chloride: 110 mmol/L (ref 98–111)
Creatinine, Ser: 2.57 mg/dL — ABNORMAL HIGH (ref 0.61–1.24)
GFR calc Af Amer: 26 mL/min — ABNORMAL LOW (ref >60)
GFR calc non Af Amer: 22 mL/min — ABNORMAL LOW (ref >60)
Glucose, Bld: 117 mg/dL — ABNORMAL HIGH (ref 70–99)
Potassium: 3.9 mmol/L (ref 3.5–5.1)
Sodium: 144 mmol/L (ref 135–145)

## 2019-11-04 LAB — CBC
HCT: 33.7 % — ABNORMAL LOW (ref 39.0–52.0)
Hemoglobin: 11 g/dL — ABNORMAL LOW (ref 13.0–17.0)
MCH: 31.6 pg (ref 26.0–34.0)
MCHC: 32.6 g/dL (ref 30.0–36.0)
MCV: 96.8 fL (ref 80.0–100.0)
Platelets: 136 10*3/uL — ABNORMAL LOW (ref 150–400)
RBC: 3.48 MIL/uL — ABNORMAL LOW (ref 4.22–5.81)
RDW: 15.9 % — ABNORMAL HIGH (ref 11.5–15.5)
WBC: 15 10*3/uL — ABNORMAL HIGH (ref 4.0–10.5)
nRBC: 0 % (ref 0.0–0.2)

## 2019-11-04 LAB — GLUCOSE, CAPILLARY
Glucose-Capillary: 121 mg/dL — ABNORMAL HIGH (ref 70–99)
Glucose-Capillary: 148 mg/dL — ABNORMAL HIGH (ref 70–99)
Glucose-Capillary: 201 mg/dL — ABNORMAL HIGH (ref 70–99)
Glucose-Capillary: 124 mg/dL — ABNORMAL HIGH (ref 70–99)

## 2019-11-04 MED ORDER — FUROSEMIDE 40 MG PO TABS
40.0000 mg | ORAL_TABLET | Freq: Two times a day (BID) | ORAL | Status: DC
  Administered 2019-11-05 – 2019-11-07 (×5): 40 mg via ORAL
  Filled 2019-11-04 (×5): qty 1

## 2019-11-04 MED ORDER — NIACIN ER (ANTIHYPERLIPIDEMIC) 500 MG PO TBCR
1000.0000 mg | EXTENDED_RELEASE_TABLET | Freq: Every day | ORAL | Status: DC
  Administered 2019-11-04 – 2019-11-07 (×4): 1000 mg via ORAL
  Filled 2019-11-04 (×4): qty 2

## 2019-11-04 MED ORDER — HYDROCORTISONE ACETATE 25 MG RE SUPP
25.0000 mg | Freq: Two times a day (BID) | RECTAL | Status: DC | PRN
  Filled 2019-11-04: qty 1

## 2019-11-04 MED ORDER — FERROUS SULFATE 325 (65 FE) MG PO TABS
325.0000 mg | ORAL_TABLET | Freq: Every day | ORAL | Status: DC
  Administered 2019-11-05 – 2019-11-08 (×4): 325 mg via ORAL
  Filled 2019-11-04 (×4): qty 1

## 2019-11-04 MED ORDER — KETOTIFEN FUMARATE 0.025 % OP SOLN
1.0000 [drp] | Freq: Two times a day (BID) | OPHTHALMIC | Status: DC
  Administered 2019-11-04 – 2019-11-08 (×8): 1 [drp] via OPHTHALMIC
  Filled 2019-11-04: qty 5

## 2019-11-04 NOTE — Telephone Encounter (Signed)
Pt left message Friday asking about diabetic shoes.   Returned call and left message that we have not received the paperwork back from Dr Baird [facility] and that is what is holding the shoes/inserts up.. I did refax it today

## 2019-11-04 NOTE — Progress Notes (Signed)
PROGRESS NOTE                                                                                                                                                                                                             Patient Demographics:    Micheal Walls, is a 84 y.o. male, DOB - [birth year], QVZ:[id number]  Admit date - 11/02/2019   Admitting Physician Shela Leff, MD  Outpatient Primary MD for the patient is [name], [place]  LOS - 2   Chief Complaint  Patient presents with  . Weakness       Brief Narrative    Micheal Walls is a 84 y.o. 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  His blood pressure was 82/48 with EMS.  Improved with a 500 cc fluid bolus.  Febrile with temperature 100.3 F.  Patient states he has been having chills since last night.  He vomited twice yesterday evening and once this morning.  No abdominal pain or diarrhea.  Today while in the bathroom he was so weak that he fell.  States his son was there at that time and he did not fall hard or injure himself.  Denies hitting his head or sustaining any other injuries from the fall.  Reports having a chronic cough.  Denies shortness of breath or chest pain.  States he has had an infection of his left big toe for a month for which he is seen by podiatry.  Patient states he has received both doses of his Covid vaccine.  ED Course: Blood pressure 113/54 on arrival to the ED.  T-max 100.7 F.  Slightly tachycardic and tachypneic.  Labs showing leukocytosis with WBC count 18.9.  Lactic acid 4.4.  Hemoglobin 11.2, stable compared to prior labs.  BUN 62, creatinine 3.7.  Creatinine previously ranging between 1.9-2.4.  UA and urine  culture pending.  Blood cultures pending.  SARS-CoV-2 PCR test pending.  Left big toe noted to have an area of ulceration with some swelling.  This is felt to be a possible source of infection.  X-ray showing soft tissue swelling but no evidence of osteomyelitis.  Chest x-ray showing  patchy airspace opacity involving the medial right lung base suspicious for pneumonia.   Patient was given ceftriaxone, azithromycin, 1.5 L fluid boluses, and Tylenol.  Blood pressure improved to 112/64 after fluid boluses   Subjective:    [name] today denies any chest pain, headache, abdominal pain or nausea, reports still some generalized weakness and fatigue, as well reports some dyspnea, but overall feeling some improvement.   Assessment  & Plan :    Principal Problem:   Aspiration pneumonia ([place]) Active Problems:   Severe sepsis ([place])   AKI (acute kidney injury) ([place])   Cellulitis   Generalized weakness  Severe sepsis secondary to suspected aspiration pneumonia:  - Febrile, slightly tachycardic, and tachypneic on arrival.  Hypotensive with EMS.  improved after a total of 2 L fluid boluses.  Labs showing leukocytosis and lactic acidosis.  Chest x-ray showing patchy airspace opacity involving the medial right lung base suspicious for pneumonia.  -Given its right lung opacity, concern for aspiration pneumonia even though patient denies any dysphagia or aspiration symptoms, will continue with IV Unasyn for now given his sepsis. - SARS-CoV-2 PCR test negative.    Mild cellulitis of the left great toe:  -appears to be mild, x-ray showing no evidence of osteomyelitis, should be covered with current regimen .  AKI on CKD stage III:  -Likely prerenal due to severe sepsis/hypotension. BUN 62, creatinine 3.7.  Creatinine previously ranging between 1.9-2.4. -Has improved with IV fluids, -Continue to hold Lasix and losartan.  OSA: Continue CPAP at night  Well-controlled insulin-dependent  diabetes: A1c 6.9 on 09/24/2019 -on  sliding scale insulin and CBG checks.  Resume home basal insulin after pharmacy med rec is done.  Generalized weakness: Suspect related to acute illness. -PT evaluation  Emesis:  -Patient reports 3 episodes of emesis since yesterday.  No abdominal pain or diarrhea.  Abdominal exam benign.  No significant elevation of LFTs.,  No recurrence since admission, continue with as needed antiemetics.  Chronic diastolic CHF: Lungs clear on exam.  Status post right BKA and has significant pitting edema of the left lower extremity which is also likely related to chronic venous stasis/immobility. -Hold giving diuretic at this time in the setting of severe sepsis/hypotension/AKI  Hypertension -Continue to hold losartan, will resume Lasix tomorrow  COVID-19 Labs  No results for input(s): DDIMER, FERRITIN, LDH, CRP in the last 72 hours.  Lab Results  Component Value Date   [place] NEGATIVE 11/02/2019     Code Status : Full  Family Communication  : None at bedside  Disposition Plan  :  Status is: Inpatient  Remains inpatient appropriate because:IV treatments appropriate due to intensity of illness or inability to take PO   Dispo: The patient is from: SNF              Anticipated d/c is to: SNF              Anticipated d/c date is: 2 days              Patient currently is not medically stable to d/c.         Consults  :  None  Procedures  : None  DVT Prophylaxis  :   heaprin  Lab Results  Component Value Date   PLT 136 (L) 11/04/2019    Antibiotics  :    Anti-infectives (From admission, onward)   Start     Dose/Rate Route Frequency Ordered Stop   11/03/19 1915  azithromycin (ZITHROMAX)  500 mg in sodium chloride 0.9 % 250 mL IVPB  Status:  Discontinued     500 mg 250 mL/hr over 60 Minutes Intravenous Every 24 hours 11/02/19 2139 11/02/19 2208   11/03/19 1915  cefTRIAXone (ROCEPHIN) 2 g in sodium chloride 0.9 % 100 mL IVPB   Status:  Discontinued     2 g 200 mL/hr over 30 Minutes Intravenous Every 24 hours 11/02/19 2139 11/02/19 2208   11/02/19 2300  Ampicillin-Sulbactam (UNASYN) 3 g in sodium chloride 0.9 % 100 mL IVPB     3 g 200 mL/hr over 30 Minutes Intravenous Every 12 hours 11/02/19 2223     11/02/19 2100  linezolid (ZYVOX) IVPB 600 mg  Status:  Discontinued     600 mg 300 mL/hr over 60 Minutes Intravenous Every 12 hours 11/02/19 2046 11/02/19 2125   11/02/19 1915  azithromycin (ZITHROMAX) 500 mg in sodium chloride 0.9 % 250 mL IVPB     500 mg 250 mL/hr over 60 Minutes Intravenous  Once 11/02/19 1907 11/02/19 2052   11/02/19 1915  cefTRIAXone (ROCEPHIN) 1 g in sodium chloride 0.9 % 100 mL IVPB     1 g 200 mL/hr over 30 Minutes Intravenous  Once 11/02/19 1907 11/02/19 2005        Objective:   Vitals:   11/03/19 2029 11/03/19 2219 11/04/19 0750 11/04/19 1600  BP: (!) 146/65  (!) 141/68 (!) 137/54  Pulse: 96 95 95 78  Resp: $Remo'20 16 19 19  'xmxzJ$ Temp: 98.4 F (36.9 C)  97.9 F (36.6 C) 97.7 F (36.5 C)  TempSrc:      SpO2: 97% 99% 97% 99%  Weight:      Height:        Wt Readings from Last 3 Encounters:  11/02/19 129.3 kg  10/31/19 129.3 kg  09/24/19 129.3 kg     Intake/Output Summary (Last 24 hours) at 11/04/2019 1710 Last data filed at 11/04/2019 1050 Gross per 24 hour  Intake --  Output 1050 ml  Net -1050 ml     Physical Exam  Awake Alert, Oriented X 3, No new F.N deficits, Normal affect Symmetrical Chest wall movement, Good air movement bilaterally, CTAB RRR,No Gallops,Rubs or new Murmurs, No Parasternal Heave +ve B.Sounds, Abd Soft, No tenderness, No rebound - guarding or rigidity. No Cyanosis, Clubbing, right BKA, Left great toe mild erythema.    Data Review:    CBC Recent Labs  Lab 11/02/19 1900 11/03/19 0625 11/04/19 0312  WBC 18.9* 17.8* 15.0*  HGB 11.2* 10.7* 11.0*  HCT 35.1* 33.1* 33.7*  PLT 146* 125* 136*  MCV 99.4 97.4 96.8  MCH 31.7 31.5 31.6  MCHC 31.9 32.3  32.6  RDW 15.9* 15.8* 15.9*  LYMPHSABS 0.6*  --   --   MONOABS 0.8  --   --   EOSABS 0.0  --   --   BASOSABS 0.0  --   --     Chemistries  Recent Labs  Lab 11/02/19 1900 11/03/19 0625 11/04/19 0312  NA 138 141 144  K 5.2* 3.6 3.9  CL 105 110 110  CO2 21* 23 24  GLUCOSE 222* 151* 117*  BUN 62* 64* 54*  CREATININE 3.71* 3.16* 2.57*  CALCIUM 9.9 9.3 9.9  AST 44*  --   --   ALT 23  --   --   ALKPHOS 47  --   --   BILITOT 0.9  --   --    ------------------------------------------------------------------------------------------------------------------ No results for input(s): CHOL,  HDL, LDLCALC, TRIG, CHOLHDL, LDLDIRECT in the last 72 hours.  Lab Results  Component Value Date   HGBA1C 6.9 (H) 09/24/2019   ------------------------------------------------------------------------------------------------------------------ No results for input(s): TSH, T4TOTAL, T3FREE, THYROIDAB in the last 72 hours.  Invalid input(s): FREET3 ------------------------------------------------------------------------------------------------------------------ No results for input(s): VITAMINB12, FOLATE, FERRITIN, TIBC, IRON, RETICCTPCT in the last 72 hours.  Coagulation profile Recent Labs  Lab 11/02/19 1900  INR 1.2    No results for input(s): DDIMER in the last 72 hours.  Cardiac Enzymes No results for input(s): CKMB, TROPONINI, MYOGLOBIN in the last 168 hours.  Invalid input(s): CK ------------------------------------------------------------------------------------------------------------------ No results found for: BNP  Inpatient Medications  Scheduled Meds: . allopurinol  100 mg Oral Daily  . aspirin  162 mg Oral Daily  . diltiazem  300 mg Oral Daily  . dorzolamide  1 drop Both Eyes BID  . gabapentin  100 mg Oral TID  . heparin  5,000 Units Subcutaneous Q8H  . insulin aspart  0-15 Units Subcutaneous TID WC  . insulin aspart  0-5 Units Subcutaneous QHS  . latanoprost  1 drop  Both Eyes QHS  . omega-3 acid ethyl esters  2 g Oral Daily  . pantoprazole  40 mg Oral Daily  . rosuvastatin  10 mg Oral Daily   Continuous Infusions: . ampicillin-sulbactam (UNASYN) IV 3 g (11/04/19 1008)   PRN Meds:.acetaminophen **OR** acetaminophen, ondansetron (ZOFRAN) IV  Micro Results Recent Results (from the past 240 hour(s))  Blood Culture (routine x 2)     Status: None (Preliminary result)   Collection Time: 11/02/19  7:00 PM   Specimen: BLOOD RIGHT HAND  Result Value Ref Range Status   Specimen Description BLOOD RIGHT HAND  Final   Special Requests   Final    BOTTLES DRAWN AEROBIC AND ANAEROBIC Blood Culture results may not be optimal due to an excessive volume of blood received in culture bottles   Culture   Final    NO GROWTH 2 DAYS Performed at [facility], [place] [address], [place], [place] [address]    Report Status PENDING  Incomplete  SARS Coronavirus 2 by RT PCR (hospital order, performed in [facility] hospital lab) Nasopharyngeal Nasopharyngeal Swab     Status: None   Collection Time: 11/02/19  8:03 PM   Specimen: Nasopharyngeal Swab  Result Value Ref Range Status   SARS Coronavirus 2 NEGATIVE NEGATIVE Final    Comment: (NOTE) SARS-CoV-2 target nucleic acids are NOT DETECTED. The SARS-CoV-2 RNA is generally detectable in upper and lower respiratory specimens during the acute phase of infection. The lowest concentration of SARS-CoV-2 viral copies this assay can detect is 250 copies / mL. A negative result does not preclude SARS-CoV-2 infection and should not be used as the sole basis for treatment or other patient management decisions.  A negative result may occur with improper specimen collection / handling, submission of specimen other than nasopharyngeal swab, presence of viral mutation(s) within the areas targeted by this assay, and inadequate number of viral copies (<250 copies / mL). A negative result must be combined with clinical observations,  patient history, and epidemiological information. Fact Sheet for Patients:   StrictlyIdeas.no Fact Sheet for Healthcare Providers: BankingDealers.co.za This test is not yet approved or cleared  by the [place] FDA and has been authorized for detection and/or diagnosis of SARS-CoV-2 by FDA under an Emergency Use Authorization (EUA).  This EUA will remain in effect (meaning this test can be used) for the duration of the COVID-19  declaration under Section 564(b)(1) of the Act, 21 U.S.C. section 360bbb-3(b)(1), unless the authorization is terminated or revoked sooner. Performed at [facility], [place] [address] [place], [place] [address]   Blood Culture (routine x 2)     Status: None (Preliminary result)   Collection Time: 11/02/19 10:05 PM   Specimen: BLOOD  Result Value Ref Range Status   Specimen Description BLOOD SITE NOT SPECIFIED  Final   Special Requests   Final    BOTTLES DRAWN AEROBIC AND ANAEROBIC Blood Culture adequate volume   Culture   Final    NO GROWTH 2 DAYS Performed at [place] [address], [name], [place] [address]    Report Status PENDING  Incomplete    Radiology Reports US RENAL  Result Date: 11/02/2019 CLINICAL DATA:  Initial evaluation for acute renal insufficiency. EXAM: RENAL / URINARY TRACT ULTRASOUND COMPLETE COMPARISON:  Prior CT from 01/12/2011. FINDINGS: Right Kidney: Renal measurements: 10.8 x 5.9 x 6.1 cm = volume: 200.5 mL. Mild diffuse cortical thinning seen about the right kidney. Renal echogenicity mildly increased. No nephrolithiasis or hydronephrosis. No focal renal mass. Left Kidney: Renal measurements: 12.0 x 6.3 x 4.5 cm = volume: 175.8 mL. Diffuse cortical thinning with mildly increased echogenicity within the renal parenchyma. No nephrolithiasis or hydronephrosis. No focal renal mass. Bladder: Small amount of layering echogenic debris noted within the bladder lumen. Other:  None. IMPRESSION: 1. Diffuse cortical thinning with mildly increased echogenicity within the renal parenchyma, consistent with chronic medical renal disease. 2. No hydronephrosis. 3. Small amount of layering echogenic material/debris within the bladder lumen. Correlation with urinalysis recommended. Electronically Signed   By: Jeannine Boga M.D.   On: 11/02/2019 22:45   DG Chest Port 1 View  Result Date: 11/02/2019 CLINICAL DATA:  Fever. EXAM: PORTABLE CHEST 1 VIEW COMPARISON:  January 09, 2011 FINDINGS: There is a patchy airspace opacity involving the medial right lung base. There is no pneumothorax. No large pleural effusion. The heart size is stable from prior study. There is no acute osseous abnormality. Aortic calcifications are noted. IMPRESSION: Patchy airspace opacity involving the medial right lung base, suspicious for pneumonia. Electronically Signed   By: Constance Holster M.D.   On: 11/02/2019 19:59   DG Foot Complete Left  Result Date: 11/02/2019 CLINICAL DATA:  Left foot and left ankle swelling. EXAM: LEFT FOOT - COMPLETE 3+ VIEW COMPARISON:  None. FINDINGS: There is no evidence of acute fracture or dislocation. Moderate severity degenerative changes seen along the dorsal aspect of the mid left foot. A moderate to large plantar calcaneal spur is seen. Marked severity vascular calcification is noted. Mild to moderate severity diffuse soft tissue swelling is seen. IMPRESSION: Moderate severity degenerative changes, with diffuse soft tissue swelling. Electronically Signed   By: Virgina Norfolk M.D.   On: 11/02/2019 20:21     Phillips Climes M.D on 11/04/2019 at 5:10 PM  Between 7am to 7pm - Pager - [phone]  After 7pm go to [email or web address] - password [facility]  [facility] -  Office  [phone]

## 2019-11-04 NOTE — TOC Progression Note (Signed)
Transition of Care [facility]) - Progression Note    Patient Details  Name: Micheal Walls MRN: [id number] Date of Birth: Oct 20, 1935  Transition of Care [facility]) CM/SW [place], [place] Phone Number: 11/04/2019, 4:58 PM  Clinical Narrative:     Met with pt and presented SNF bed offers. Pt chooses [facility] because it is close to his home. CSW attempted to call pts daughter [name] at [phone] but unable to reach. Pt states he will call his daughter and let her know of his choice. Will follow up with [facility].  Expected Discharge Plan: [place] Barriers to Discharge: Continued Medical Work up  Expected Discharge Plan and Services Expected Discharge Plan: [place] In-house Referral: Clinical Social Work     Living arrangements for the past 2 months: Single Family Home                                       Social Determinants of Health (SDOH) Interventions    Readmission Risk Interventions No flowsheet data found.

## 2019-11-05 ENCOUNTER — Telehealth: Payer: Self-pay

## 2019-11-05 DIAGNOSIS — R531 Weakness: Secondary | ICD-10-CM

## 2019-11-05 LAB — BASIC METABOLIC PANEL
Anion gap: 8 (ref 5–15)
BUN: 44 mg/dL — ABNORMAL HIGH (ref 8–23)
CO2: 23 mmol/L (ref 22–32)
Calcium: 10.3 mg/dL (ref 8.9–10.3)
Chloride: 112 mmol/L — ABNORMAL HIGH (ref 98–111)
Creatinine, Ser: 2.03 mg/dL — ABNORMAL HIGH (ref 0.61–1.24)
GFR calc Af Amer: 34 mL/min — ABNORMAL LOW (ref >60)
GFR calc non Af Amer: 30 mL/min — ABNORMAL LOW (ref >60)
Glucose, Bld: 134 mg/dL — ABNORMAL HIGH (ref 70–99)
Potassium: 4.5 mmol/L (ref 3.5–5.1)
Sodium: 143 mmol/L (ref 135–145)

## 2019-11-05 LAB — GLUCOSE, CAPILLARY
Glucose-Capillary: 150 mg/dL — ABNORMAL HIGH (ref 70–99)
Glucose-Capillary: 152 mg/dL — ABNORMAL HIGH (ref 70–99)
Glucose-Capillary: 171 mg/dL — ABNORMAL HIGH (ref 70–99)
Glucose-Capillary: 148 mg/dL — ABNORMAL HIGH (ref 70–99)

## 2019-11-05 LAB — CBC
HCT: 34.7 % — ABNORMAL LOW (ref 39.0–52.0)
Hemoglobin: 11.3 g/dL — ABNORMAL LOW (ref 13.0–17.0)
MCH: 31.4 pg (ref 26.0–34.0)
MCHC: 32.6 g/dL (ref 30.0–36.0)
MCV: 96.4 fL (ref 80.0–100.0)
Platelets: 125 10*3/uL — ABNORMAL LOW (ref 150–400)
RBC: 3.6 MIL/uL — ABNORMAL LOW (ref 4.22–5.81)
RDW: 15.8 % — ABNORMAL HIGH (ref 11.5–15.5)
WBC: 9.3 10*3/uL (ref 4.0–10.5)
nRBC: 0 % (ref 0.0–0.2)

## 2019-11-05 MED ORDER — GUAIFENESIN-DM 100-10 MG/5ML PO SYRP
5.0000 mL | ORAL_SOLUTION | ORAL | Status: DC | PRN
  Administered 2019-11-05 – 2019-11-08 (×2): 5 mL via ORAL
  Filled 2019-11-05 (×2): qty 5

## 2019-11-05 MED ORDER — LOSARTAN POTASSIUM 50 MG PO TABS
50.0000 mg | ORAL_TABLET | Freq: Every day | ORAL | Status: DC
  Administered 2019-11-05 – 2019-11-07 (×3): 50 mg via ORAL
  Filled 2019-11-05 (×3): qty 1

## 2019-11-05 NOTE — Telephone Encounter (Signed)
Pt called stating he was suppose to receive Diabetic Shoes and has not yet to receive them. Per JM form was sent over but has to be signed by RS as well. Another form was sent over 11/04/2019 Last note was printed, JM is currently working on it .

## 2019-11-05 NOTE — Telephone Encounter (Signed)
Notes/forms faxed to [place] and [place] [phone] conformation received

## 2019-11-05 NOTE — TOC Progression Note (Signed)
Transition of Care [facility]) - Progression Note    Patient Details  Name: Micheal Walls MRN: [id number] Date of Birth: 1936/01/01  Transition of Care [facility]) CM/SW [place], [place] Phone Number:  11/05/2019, 1:39 PM  Clinical Narrative:    CSW spoke with pt daughter Micheal Walls at [phone]. Introduced self, role, reason for call. Pt daughter had contacted me to let me know that previous CSW calls had been blocked by her spam filters on her phone. We discussed recommendations, SNF vs CIR and CSW reviewed list of SNF offers. Pt daughter expressed she does not want [facility] but would like to talk to liaison from [facility]. All information sent to [facility], admissions liaison with [facility] and she will be in touch with pt daughter. New COVID requested from Dr. [facility], hand off left for CSW covering pt rest of the week to text pt daughter cell so she can call them.   Expected Discharge Plan: [place] Barriers to Discharge: Continued Medical Work up  Expected Discharge Plan and Services Expected Discharge Plan: [place] In-house Referral: Clinical Social Work Living arrangements for the past 2 months: Single Family Home      Readmission Risk Interventions No flowsheet data found.

## 2019-11-05 NOTE — Progress Notes (Signed)
PROGRESS NOTE                                                                                                                                                                                                             Patient Demographics:    Micheal Walls, is a 84 y.o. male, DOB - 11-Dec-1935, NAT:[id number]  Admit date - 11/02/2019   Admitting Physician Shela Leff, MD  Outpatient Primary MD for the patient is [name], [place]  LOS - 3   Chief Complaint  Patient presents with  . Weakness       Brief Narrative    Micheal Walls is a 84 y.o. 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    As well he was noted to be febrile 100.7, with leukocytosis, work-up significant for pneumonia, he was admitted for further work-up .    Subjective:    Micheal Walls today denies any chest pain, headache, abdominal pain or nausea, poor generalized weakness has improved, reports dyspnea has improved as well, but reports some persistent cough .   Assessment  & Plan :    Principal Problem:   Aspiration pneumonia ([place]) Active Problems:   Severe sepsis ([place])   AKI (acute kidney injury) ([place])   Cellulitis   Generalized weakness  Severe sepsis secondary to suspected aspiration pneumonia:  - Febrile, slightly tachycardic, and tachypneic on arrival.  Hypotensive with EMS.  improved after a total of 2 L fluid boluses.  Labs showing leukocytosis and lactic acidosis.  Chest x-ray showing patchy airspace opacity involving the medial right lung base suspicious for pneumonia.  -Given its right lung opacity, concern for aspiration pneumonia even though patient denies any dysphagia or aspiration symptoms, will  continue with IV Unasyn for now given his sepsis. -Patient was encouraged use incentive spirometry, flutter valve, discussed with staff, they will try to get out of bed to chair as well. - SARS-CoV-2 PCR test negative.    Mild cellulitis of the left great toe:  -appears to be mild, x-ray showing no evidence of osteomyelitis, should be covered with current regimen .  AKI on CKD stage III:  -Likely prerenal  due to severe sepsis/hypotension. BUN 62, creatinine 3.7.  Creatinine previously ranging between 1.9-2.4. -Resolved, it is back to baseline -Continue to hold Lasix and losartan.  OSA: Continue CPAP at night  Well-controlled insulin-dependent diabetes: A1c 6.9 on 09/24/2019 -BG's are controlled on insulin sliding scale.  Generalized weakness: -  Suspect related to acute illness.  As well he is with right BKA, PT has seen patient, recommendation for SNF.  Emesis:  -Patient reports 3 episodes of emesis since yesterday.  No abdominal pain or diarrhea.  Abdominal exam benign.  No significant elevation of LFTs.,  No recurrence since admission, continue with as needed antiemetics.  Chronic diastolic CHF: Lungs clear on exam.  Status post right BKA and has significant pitting edema of the left lower extremity which is also likely related to chronic venous stasis/immobility. -Hold giving diuretic at this time in the setting of severe sepsis/hypotension/AKI  Hypertension -Back on Lasix and Cardizem blood pressure is acceptable, given blood pressure started to increase and stable renal function will resume back on home dose losartan.  COVID-19 Labs  No results for input(s): DDIMER, FERRITIN, LDH, CRP in the last 72 hours.  Lab Results  Component Value Date   [place] NEGATIVE 11/02/2019     Code Status : Full  Family Communication  : None at bedside,Tried to call wife, unable to leave a voicemail  Disposition Plan  :  Status is: Inpatient  Remains inpatient appropriate  because:IV treatments appropriate due to intensity of illness or inability to take PO   Dispo: The patient is from: SNF              Anticipated d/c is to: SNF              Anticipated d/c date is: 2 days              Patient currently is not medically stable to d/c.   will be ready for discharge in 1 to 2 days to SNF on oral ABX.         Consults  :  None  Procedures  : None  DVT Prophylaxis  :  Berino heaprin  Lab Results  Component Value Date   PLT 125 (L) 11/05/2019    Antibiotics  :    Anti-infectives (From admission, onward)   Start     Dose/Rate Walls Frequency Ordered Stop   11/03/19 1915  azithromycin (ZITHROMAX) 500 mg in sodium chloride 0.9 % 250 mL IVPB  Status:  Discontinued     500 mg 250 mL/hr over 60 Minutes Intravenous Every 24 hours 11/02/19 2139 11/02/19 2208   11/03/19 1915  cefTRIAXone (ROCEPHIN) 2 g in sodium chloride 0.9 % 100 mL IVPB  Status:  Discontinued     2 g 200 mL/hr over 30 Minutes Intravenous Every 24 hours 11/02/19 2139 11/02/19 2208   11/02/19 2300  Ampicillin-Sulbactam (UNASYN) 3 g in sodium chloride 0.9 % 100 mL IVPB     3 g 200 mL/hr over 30 Minutes Intravenous Every 12 hours 11/02/19 2223     11/02/19 2100  linezolid (ZYVOX) IVPB 600 mg  Status:  Discontinued     600 mg 300 mL/hr over 60 Minutes Intravenous Every 12 hours 11/02/19 2046 11/02/19 2125   11/02/19 1915  azithromycin (ZITHROMAX) 500 mg in sodium chloride 0.9 % 250 mL IVPB     500 mg 250 mL/hr over 60 Minutes Intravenous  Once 11/02/19 1907 11/02/19 2052   11/02/19 1915  cefTRIAXone (ROCEPHIN) 1  g in sodium chloride 0.9 % 100 mL IVPB     1 g 200 mL/hr over 30 Minutes Intravenous  Once 11/02/19 1907 11/02/19 2005        Objective:   Vitals:   11/04/19 1600 11/04/19 2153 11/04/19 2243 11/05/19 0729  BP: (!) 137/54  (!) 159/65 (!) 153/78  Pulse: 78 76 83 79  Resp: $Remo'19 16 20 17  'OwECS$ Temp: 97.7 F (36.5 C)  98.3 F (36.8 C) 98.2 F (36.8 C)  TempSrc:      SpO2: 99% 96%  99% 94%  Weight:      Height:        Wt Readings from Last 3 Encounters:  11/02/19 129.3 kg  10/31/19 129.3 kg  09/24/19 129.3 kg     Intake/Output Summary (Last 24 hours) at 11/05/2019 1337 Last data filed at 11/05/2019 0900 Gross per 24 hour  Intake 540 ml  Output 1000 ml  Net -460 ml     Physical Exam  Awake Alert, Oriented X 3, No new F.N deficits, Normal affect Symmetrical Chest wall movement, Good air movement bilaterally, CTAB RRR,No Gallops,Rubs or new Murmurs, No Parasternal Heave +ve B.Sounds, Abd Soft, No tenderness, No rebound - guarding or rigidity. No Cyanosis, Clubbing, right BKA, Left great toe mild erythema.    Data Review:    CBC Recent Labs  Lab 11/02/19 1900 11/03/19 0625 11/04/19 0312 11/05/19 0337  WBC 18.9* 17.8* 15.0* 9.3  HGB 11.2* 10.7* 11.0* 11.3*  HCT 35.1* 33.1* 33.7* 34.7*  PLT 146* 125* 136* 125*  MCV 99.4 97.4 96.8 96.4  MCH 31.7 31.5 31.6 31.4  MCHC 31.9 32.3 32.6 32.6  RDW 15.9* 15.8* 15.9* 15.8*  LYMPHSABS 0.6*  --   --   --   MONOABS 0.8  --   --   --   EOSABS 0.0  --   --   --   BASOSABS 0.0  --   --   --     Chemistries  Recent Labs  Lab 11/02/19 1900 11/03/19 0625 11/04/19 0312 11/05/19 0337  NA 138 141 144 143  K 5.2* 3.6 3.9 4.5  CL 105 110 110 112*  CO2 21* $Remov'23 24 23  'BOIcYk$ GLUCOSE 222* 151* 117* 134*  BUN 62* 64* 54* 44*  CREATININE 3.71* 3.16* 2.57* 2.03*  CALCIUM 9.9 9.3 9.9 10.3  AST 44*  --   --   --   ALT 23  --   --   --   ALKPHOS 47  --   --   --   BILITOT 0.9  --   --   --    ------------------------------------------------------------------------------------------------------------------ No results for input(s): CHOL, HDL, LDLCALC, TRIG, CHOLHDL, LDLDIRECT in the last 72 hours.  Lab Results  Component Value Date   HGBA1C 6.9 (H) 09/24/2019   ------------------------------------------------------------------------------------------------------------------ No results for input(s): TSH, T4TOTAL,  T3FREE, THYROIDAB in the last 72 hours.  Invalid input(s): FREET3 ------------------------------------------------------------------------------------------------------------------ No results for input(s): VITAMINB12, FOLATE, FERRITIN, TIBC, IRON, RETICCTPCT in the last 72 hours.  Coagulation profile Recent Labs  Lab 11/02/19 1900  INR 1.2    No results for input(s): DDIMER in the last 72 hours.  Cardiac Enzymes No results for input(s): CKMB, TROPONINI, MYOGLOBIN in the last 168 hours.  Invalid input(s): CK ------------------------------------------------------------------------------------------------------------------ No results found for: BNP  Inpatient Medications  Scheduled Meds: . allopurinol  100 mg Oral Daily  . aspirin  162 mg Oral Daily  . diltiazem  300 mg Oral Daily  .  dorzolamide  1 drop Both Eyes BID  . ferrous sulfate  325 mg Oral Q breakfast  . furosemide  40 mg Oral BID  . gabapentin  100 mg Oral TID  . heparin  5,000 Units Subcutaneous Q8H  . insulin aspart  0-15 Units Subcutaneous TID WC  . insulin aspart  0-5 Units Subcutaneous QHS  . ketotifen  1 drop Both Eyes BID  . latanoprost  1 drop Both Eyes QHS  . niacin  1,000 mg Oral QHS  . omega-3 acid ethyl esters  2 g Oral Daily  . pantoprazole  40 mg Oral Daily  . rosuvastatin  10 mg Oral Daily   Continuous Infusions: . ampicillin-sulbactam (UNASYN) IV 3 g (11/05/19 1114)   PRN Meds:.acetaminophen **OR** acetaminophen, guaiFENesin-dextromethorphan, hydrocortisone, ondansetron (ZOFRAN) IV  Micro Results Recent Results (from the past 240 hour(s))  Blood Culture (routine x 2)     Status: None (Preliminary result)   Collection Time: 11/02/19  7:00 PM   Specimen: BLOOD RIGHT HAND  Result Value Ref Range Status   Specimen Description BLOOD RIGHT HAND  Final   Special Requests   Final    BOTTLES DRAWN AEROBIC AND ANAEROBIC Blood Culture results may not be optimal due to an excessive volume of blood  received in culture bottles   Culture   Final    NO GROWTH 2 DAYS Performed at [facility], [place] [address], [place], [place] [address]    Report Status PENDING  Incomplete  SARS Coronavirus 2 by RT PCR (hospital order, performed in [facility] hospital lab) Nasopharyngeal Nasopharyngeal Swab     Status: None   Collection Time: 11/02/19  8:03 PM   Specimen: Nasopharyngeal Swab  Result Value Ref Range Status   SARS Coronavirus 2 NEGATIVE NEGATIVE Final    Comment: (NOTE) SARS-CoV-2 target nucleic acids are NOT DETECTED. The SARS-CoV-2 RNA is generally detectable in upper and lower respiratory specimens during the acute phase of infection. The lowest concentration of SARS-CoV-2 viral copies this assay can detect is 250 copies / mL. A negative result does not preclude SARS-CoV-2 infection and should not be used as the sole basis for treatment or other patient management decisions.  A negative result may occur with improper specimen collection / handling, submission of specimen other than nasopharyngeal swab, presence of viral mutation(s) within the areas targeted by this assay, and inadequate number of viral copies (<250 copies / mL). A negative result must be combined with clinical observations, patient history, and epidemiological information. Fact Sheet for Patients:   StrictlyIdeas.no Fact Sheet for Healthcare Providers: BankingDealers.co.za This test is not yet approved or cleared  by the [place] FDA and has been authorized for detection and/or diagnosis of SARS-CoV-2 by FDA under an Emergency Use Authorization (EUA).  This EUA will remain in effect (meaning this test can be used) for the duration of the COVID-19 declaration under Section 564(b)(1) of the Act, 21 U.S.C. section 360bbb-3(b)(1), unless the authorization is terminated or revoked sooner. Performed at [facility], [place] [address], [place],  [place] [address]   Blood Culture (routine x 2)     Status: None (Preliminary result)   Collection Time: 11/02/19 10:05 PM   Specimen: BLOOD  Result Value Ref Range Status   Specimen Description BLOOD SITE NOT SPECIFIED  Final   Special Requests   Final    BOTTLES DRAWN AEROBIC AND ANAEROBIC Blood Culture adequate volume   Culture   Final    NO  GROWTH 2 DAYS Performed at [facility], [place] [address], [place], [place] [address]    Report Status PENDING  Incomplete    Radiology Reports US RENAL  Result Date: 11/02/2019 CLINICAL DATA:  Initial evaluation for acute renal insufficiency. EXAM: RENAL / URINARY TRACT ULTRASOUND COMPLETE COMPARISON:  Prior CT from 01/12/2011. FINDINGS: Right Kidney: Renal measurements: 10.8 x 5.9 x 6.1 cm = volume: 200.5 mL. Mild diffuse cortical thinning seen about the right kidney. Renal echogenicity mildly increased. No nephrolithiasis or hydronephrosis. No focal renal mass. Left Kidney: Renal measurements: 12.0 x 6.3 x 4.5 cm = volume: 175.8 mL. Diffuse cortical thinning with mildly increased echogenicity within the renal parenchyma. No nephrolithiasis or hydronephrosis. No focal renal mass. Bladder: Small amount of layering echogenic debris noted within the bladder lumen. Other: None. IMPRESSION: 1. Diffuse cortical thinning with mildly increased echogenicity within the renal parenchyma, consistent with chronic medical renal disease. 2. No hydronephrosis. 3. Small amount of layering echogenic material/debris within the bladder lumen. Correlation with urinalysis recommended. Electronically Signed   By: Jeannine Boga M.D.   On: 11/02/2019 22:45   DG Chest Port 1 View  Result Date: 11/02/2019 CLINICAL DATA:  Fever. EXAM: PORTABLE CHEST 1 VIEW COMPARISON:  January 09, 2011 FINDINGS: There is a patchy airspace opacity involving the medial right lung base. There is no pneumothorax. No large pleural effusion. The heart size is stable from prior study. There is no acute  osseous abnormality. Aortic calcifications are noted. IMPRESSION: Patchy airspace opacity involving the medial right lung base, suspicious for pneumonia. Electronically Signed   By: Constance Holster M.D.   On: 11/02/2019 19:59   DG Foot Complete Left  Result Date: 11/02/2019 CLINICAL DATA:  Left foot and left ankle swelling. EXAM: LEFT FOOT - COMPLETE 3+ VIEW COMPARISON:  None. FINDINGS: There is no evidence of acute fracture or dislocation. Moderate severity degenerative changes seen along the dorsal aspect of the mid left foot. A moderate to large plantar calcaneal spur is seen. Marked severity vascular calcification is noted. Mild to moderate severity diffuse soft tissue swelling is seen. IMPRESSION: Moderate severity degenerative changes, with diffuse soft tissue swelling. Electronically Signed   By: Virgina Norfolk M.D.   On: 11/02/2019 20:21     Phillips Climes M.D on 11/05/2019 at 1:37 PM  Between 7am to 7pm - Pager - [phone]  After 7pm go to [email or web address] - password [facility]  [facility] -  Office  [phone]

## 2019-11-05 NOTE — Progress Notes (Signed)
Physical Therapy Treatment Patient Details Name: Micheal Walls MRN: [id number] DOB: 04/07/1936 Today's Date: 11/05/2019    History of Present Illness 84 y.o. 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    PT Comments    Pt's family wishes pt to put more effort into walking, but pt is generally satisfied with mobility at a w/c level and will likely not put forth the effort or allow himself to work toward gaining comfort in standing/walking.  Emphasis though was on standing activity.   Follow Up Recommendations  SNF;Supervision/Assistance - 24 hour     Equipment Recommendations  None recommended by PT    Recommendations for Other Services       Precautions / Restrictions Precautions Precautions: Fall    Mobility  Bed Mobility               General bed mobility comments: up in the chair on arrival  Transfers Overall transfer level: Needs assistance Equipment used: Rolling walker (2 wheeled) Transfers: Sit to/from Stand Sit to Stand: Mod assist;+2 safety/equipment         General transfer comment: x3 with cues for hand placement and assist to come forward before assis to boost.  Ambulation/Gait Ambulation/Gait assistance: Mod assist;+2 safety/equipment Gait Distance (Feet): 3 Feet(then 5' x2 with rest in between.) Assistive device: Rolling walker (2 wheeled) Gait Pattern/deviations: Step-through pattern;Decreased step length - right;Decreased step length - left;Decreased stance time - right;Decreased stance time - left;Decreased stride length   Gait velocity interpretation: <1.31 ft/sec, indicative of household ambulator General Gait Details: worked on w/shift and stepping, stay upright and sequencing with the RW.   Pt needing stability and w/shift assist plus help with stay at correct proximity to the RW.Marland Kitchen  Pt's R LE in prosthesis with ER imbalance causing pt some difficulty   Stairs             Wheelchair Mobility    Modified Rankin (Stroke Patients Only)       Balance Overall balance assessment: Needs assistance Sitting-balance support: No upper extremity supported;Feet supported Sitting balance-Leahy Scale: Good     Standing balance support: Bilateral upper extremity supported;During functional activity Standing balance-Leahy Scale: Poor Standing balance comment: reliant on UE support                            Cognition Arousal/Alertness: Awake/alert Behavior During Therapy: WFL for tasks assessed/performed Overall Cognitive Status: Within Functional Limits for tasks assessed                                 General Comments: Very pleasant and cooperative.      Exercises      General Comments        Pertinent Vitals/Pain Pain Assessment: No/denies pain    Home Living                      Prior Function            PT Goals (current goals can now be found in the care plan section) Acute Rehab PT Goals Patient Stated Goal: get stronger PT Goal Formulation: With patient Time For Goal Achievement: 11/17/19 Potential to Achieve Goals: Good  Progress towards PT goals: Progressing toward goals    Frequency    Min 2X/week      PT Plan Current plan remains appropriate    Co-evaluation              AM-PAC PT "6 Clicks" Mobility   Outcome Measure  Help needed turning from your back to your side while in a flat bed without using bedrails?: A Little Help needed moving from lying on your back to sitting on the side of a flat bed without using bedrails?: A Little Help needed moving to and from a bed to a chair (including a wheelchair)?: A Lot Help needed standing up from a chair using your arms (e.g., wheelchair or bedside  chair)?: A Lot Help needed to walk in hospital room?: Total Help needed climbing 3-5 steps with a railing? : Total 6 Click Score: 12    End of Session   Activity Tolerance: Patient tolerated treatment well Patient left: in chair;with call bell/phone within reach Nurse Communication: Mobility status PT Visit Diagnosis: Other abnormalities of gait and mobility (R26.89);Difficulty in walking, not elsewhere classified (R26.2);Muscle weakness (generalized) (M62.81)     Time: 2026-6916 PT Time Calculation (min) (ACUTE ONLY): 33 min  Charges:  $Gait Training: 8-22 mins $Therapeutic Activity: 8-22 mins                     11/05/2019  Micheal Walls., PT Acute Rehabilitation Services [phone]  (pager) [phone]  (office)   Micheal Walls Micheal Walls 11/05/2019, 2:42 PM

## 2019-11-06 DIAGNOSIS — N179 Acute kidney failure, unspecified: Secondary | ICD-10-CM

## 2019-11-06 LAB — CBC
HCT: 35.5 % — ABNORMAL LOW (ref 39.0–52.0)
Hemoglobin: 11.5 g/dL — ABNORMAL LOW (ref 13.0–17.0)
MCH: 31.3 pg (ref 26.0–34.0)
MCHC: 32.4 g/dL (ref 30.0–36.0)
MCV: 96.7 fL (ref 80.0–100.0)
Platelets: 127 10*3/uL — ABNORMAL LOW (ref 150–400)
RBC: 3.67 MIL/uL — ABNORMAL LOW (ref 4.22–5.81)
RDW: 15.8 % — ABNORMAL HIGH (ref 11.5–15.5)
WBC: 7.8 10*3/uL (ref 4.0–10.5)
nRBC: 0 % (ref 0.0–0.2)

## 2019-11-06 LAB — BASIC METABOLIC PANEL
Anion gap: 9 (ref 5–15)
BUN: 41 mg/dL — ABNORMAL HIGH (ref 8–23)
CO2: 23 mmol/L (ref 22–32)
Calcium: 10.6 mg/dL — ABNORMAL HIGH (ref 8.9–10.3)
Chloride: 111 mmol/L (ref 98–111)
Creatinine, Ser: 1.97 mg/dL — ABNORMAL HIGH (ref 0.61–1.24)
GFR calc Af Amer: 36 mL/min — ABNORMAL LOW (ref >60)
GFR calc non Af Amer: 31 mL/min — ABNORMAL LOW (ref >60)
Glucose, Bld: 134 mg/dL — ABNORMAL HIGH (ref 70–99)
Potassium: 4.7 mmol/L (ref 3.5–5.1)
Sodium: 143 mmol/L (ref 135–145)

## 2019-11-06 LAB — GLUCOSE, CAPILLARY
Glucose-Capillary: 168 mg/dL — ABNORMAL HIGH (ref 70–99)
Glucose-Capillary: 137 mg/dL — ABNORMAL HIGH (ref 70–99)
Glucose-Capillary: 172 mg/dL — ABNORMAL HIGH (ref 70–99)
Glucose-Capillary: 149 mg/dL — ABNORMAL HIGH (ref 70–99)

## 2019-11-06 MED ORDER — AMOXICILLIN-POT CLAVULANATE 875-125 MG PO TABS: 1.0000 | ORAL_TABLET | Freq: Two times a day (BID) | ORAL | Status: DC

## 2019-11-06 MED ORDER — AMOXICILLIN-POT CLAVULANATE 875-125 MG PO TABS
1.0000 | ORAL_TABLET | Freq: Two times a day (BID) | ORAL | Status: DC
  Administered 2019-11-06 – 2019-11-08 (×4): 1 via ORAL
  Filled 2019-11-06 (×4): qty 1

## 2019-11-06 NOTE — Progress Notes (Signed)
PROGRESS NOTE                                                                                                                                                                                                             Patient Demographics:    Micheal Walls, is a 84 y.o. male, DOB - 1936/01/30, IHK:[id number]  Admit date - 11/02/2019   Admitting Physician Shela Leff, MD  Outpatient Primary MD for the patient is [name], [place]  LOS - 4   Chief Complaint  Patient presents with  . Weakness       Brief Narrative    Micheal Walls is a 84 y.o. 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    As well he was noted to be febrile 100.7, with leukocytosis, work-up significant for pneumonia, he was admitted for further work-up .    Subjective:   Patient mentions that he is feeling better.  Still has a cough with only minimal expectoration.  Denies any shortness of breath.  No chest pain.  Denies any nausea or vomiting.   Assessment  & Plan :    Severe sepsis secondary to suspected aspiration pneumonia:  - Febrile, slightly tachycardic, and tachypneic on arrival.  Hypotensive with EMS.  improved after a total of 2 L fluid boluses.  Labs showing leukocytosis and lactic acidosis.  Chest x-ray showing patchy airspace opacity involving the medial right lung base suspicious for pneumonia.  -Given its right lung opacity, concern for aspiration pneumonia even though patient denies any dysphagia or aspiration symptoms, will continue with IV Unasyn for now given his sepsis. -Patient was encouraged use incentive spirometry, flutter valve, discussed with staff, they will try to get out of bed to chair as  well. - SARS-CoV-2 PCR test negative.   Patient seems to be improving.  Will change to oral antibiotics today.  Mild cellulitis of the left great toe:  -appears to be mild, x-ray showing no evidence of osteomyelitis, should be covered with current regimen .  AKI on CKD stage III:  -Likely prerenal due to severe sepsis/hypotension. BUN 62, creatinine 3.7.  Creatinine previously ranging between 1.9-2.4. -Resolved  with IV hydration. -Continue to hold losartan.  OSA: Continue CPAP at night  Well-controlled insulin-dependent diabetes: A1c 6.9 on 09/24/2019 -BG's are controlled on insulin sliding scale.  Generalized weakness: -  Suspect related to acute illness.  As well he is with right BKA, PT has seen patient, recommendation for SNF.  Emesis:  Now resolved.  Chronic diastolic CHF:  Stable.  Well compensated.  Back on diuretics.  Essential hypertension Stable currently.  Occasional high readings noted.   Code Status : Full Family Communication  : None at bedside,Tried to call wife, unable to leave a voicemail Disposition Plan  :  Status is: Inpatient  Remains inpatient appropriate because:IV treatments appropriate due to intensity of illness or inability to take PO   Dispo: The patient is from: SNF              Anticipated d/c is to: SNF              Anticipated d/c date is: May 27              Patient currently is not medically stable to d/c.   will be ready for discharge in 1 to 2 days to SNF on oral ABX.      Consults  :  None  Procedures  : None  DVT Prophylaxis  :  Stanton heaprin  Lab Results  Component Value Date   PLT 127 (L) 11/06/2019    Antibiotics  :    Anti-infectives (From admission, onward)   Start     Dose/Rate Route Frequency Ordered Stop   11/03/19 1915  azithromycin (ZITHROMAX) 500 mg in sodium chloride 0.9 % 250 mL IVPB  Status:  Discontinued     500 mg 250 mL/hr over 60 Minutes Intravenous Every 24 hours 11/02/19 2139 11/02/19 2208    11/03/19 1915  cefTRIAXone (ROCEPHIN) 2 g in sodium chloride 0.9 % 100 mL IVPB  Status:  Discontinued     2 g 200 mL/hr over 30 Minutes Intravenous Every 24 hours 11/02/19 2139 11/02/19 2208   11/02/19 2300  Ampicillin-Sulbactam (UNASYN) 3 g in sodium chloride 0.9 % 100 mL IVPB     3 g 200 mL/hr over 30 Minutes Intravenous Every 12 hours 11/02/19 2223     11/02/19 2100  linezolid (ZYVOX) IVPB 600 mg  Status:  Discontinued     600 mg 300 mL/hr over 60 Minutes Intravenous Every 12 hours 11/02/19 2046 11/02/19 2125   11/02/19 1915  azithromycin (ZITHROMAX) 500 mg in sodium chloride 0.9 % 250 mL IVPB     500 mg 250 mL/hr over 60 Minutes Intravenous  Once 11/02/19 1907 11/02/19 2052   11/02/19 1915  cefTRIAXone (ROCEPHIN) 1 g in sodium chloride 0.9 % 100 mL IVPB     1 g 200 mL/hr over 30 Minutes Intravenous  Once 11/02/19 1907 11/02/19 2005        Objective:   Vitals:   11/05/19 0729 11/05/19 1642 11/05/19 2242 11/06/19 0852  BP: (!) 153/78 (!) 141/74 (!) 180/79 131/69  Pulse: 79 74 80 87  Resp: $Remo'17 16  20  'wcUuR$ Temp: 98.2 F (36.8 C) 97.9 F (36.6 C) 98.3 F (36.8 C) 97.9 F (36.6 C)  TempSrc:      SpO2: 94% 97% 97% 99%  Weight:      Height:        Wt Readings from Last 3 Encounters:  11/02/19 129.3 kg  10/31/19 129.3 kg  09/24/19 129.3 kg  Intake/Output Summary (Last 24 hours) at 11/06/2019 1300 Last data filed at 11/06/2019 0700 Gross per 24 hour  Intake 200 ml  Output 1700 ml  Net -1500 ml     Physical Exam  General appearance: Awake alert.  In no distress Resp: Clear to auscultation bilaterally.  Normal effort Cardio: S1-S2 is normal regular.  No S3-S4.  No rubs murmurs or bruit GI: Abdomen is soft.  Nontender nondistended.  Bowel sounds are present normal.  No masses organomegaly Extremities: No edema.  Full range of motion of lower extremities. Neurologic: Alert and oriented x3.  No focal neurological deficits.      Data Review:    CBC Recent Labs  Lab  11/02/19 1900 11/03/19 0625 11/04/19 0312 11/05/19 0337 11/06/19 0314  WBC 18.9* 17.8* 15.0* 9.3 7.8  HGB 11.2* 10.7* 11.0* 11.3* 11.5*  HCT 35.1* 33.1* 33.7* 34.7* 35.5*  PLT 146* 125* 136* 125* 127*  MCV 99.4 97.4 96.8 96.4 96.7  MCH 31.7 31.5 31.6 31.4 31.3  MCHC 31.9 32.3 32.6 32.6 32.4  RDW 15.9* 15.8* 15.9* 15.8* 15.8*  LYMPHSABS 0.6*  --   --   --   --   MONOABS 0.8  --   --   --   --   EOSABS 0.0  --   --   --   --   BASOSABS 0.0  --   --   --   --     Chemistries  Recent Labs  Lab 11/02/19 1900 11/03/19 0625 11/04/19 0312 11/05/19 0337 11/06/19 0314  NA 138 141 144 143 143  K 5.2* 3.6 3.9 4.5 4.7  CL 105 110 110 112* 111  CO2 21* $Remov'23 24 23 23  'aACbhU$ GLUCOSE 222* 151* 117* 134* 134*  BUN 62* 64* 54* 44* 41*  CREATININE 3.71* 3.16* 2.57* 2.03* 1.97*  CALCIUM 9.9 9.3 9.9 10.3 10.6*  AST 44*  --   --   --   --   ALT 23  --   --   --   --   ALKPHOS 47  --   --   --   --   BILITOT 0.9  --   --   --   --     Coagulation profile Recent Labs  Lab 11/02/19 1900  INR 1.2     Inpatient Medications  Scheduled Meds: . allopurinol  100 mg Oral Daily  . aspirin  162 mg Oral Daily  . diltiazem  300 mg Oral Daily  . dorzolamide  1 drop Both Eyes BID  . ferrous sulfate  325 mg Oral Q breakfast  . furosemide  40 mg Oral BID  . gabapentin  100 mg Oral TID  . heparin  5,000 Units Subcutaneous Q8H  . insulin aspart  0-15 Units Subcutaneous TID WC  . insulin aspart  0-5 Units Subcutaneous QHS  . ketotifen  1 drop Both Eyes BID  . latanoprost  1 drop Both Eyes QHS  . losartan  50 mg Oral Daily  . niacin  1,000 mg Oral QHS  . omega-3 acid ethyl esters  2 g Oral Daily  . pantoprazole  40 mg Oral Daily  . rosuvastatin  10 mg Oral Daily   Continuous Infusions: . ampicillin-sulbactam (UNASYN) IV 3 g (11/06/19 0940)   PRN Meds:.acetaminophen **OR** acetaminophen, guaiFENesin-dextromethorphan, hydrocortisone, ondansetron (ZOFRAN) IV  Micro Results Recent Results (from  the past 240 hour(s))  Blood Culture (routine x 2)     Status: None (Preliminary result)  Collection Time: 11/02/19  7:00 PM   Specimen: BLOOD RIGHT HAND  Result Value Ref Range Status   Specimen Description BLOOD RIGHT HAND  Final   Special Requests   Final    BOTTLES DRAWN AEROBIC AND ANAEROBIC Blood Culture results may not be optimal due to an excessive volume of blood received in culture bottles   Culture   Final    NO GROWTH 3 DAYS Performed at [facility], [place] [address] [place], [place] [address]    Report Status PENDING  Incomplete  SARS Coronavirus 2 by RT PCR (hospital order, performed in [facility] hospital lab) Nasopharyngeal Nasopharyngeal Swab     Status: None   Collection Time: 11/02/19  8:03 PM   Specimen: Nasopharyngeal Swab  Result Value Ref Range Status   SARS Coronavirus 2 NEGATIVE NEGATIVE Final    Comment: (NOTE) SARS-CoV-2 target nucleic acids are NOT DETECTED. The SARS-CoV-2 RNA is generally detectable in upper and lower respiratory specimens during the acute phase of infection. The lowest concentration of SARS-CoV-2 viral copies this assay can detect is 250 copies / mL. A negative result does not preclude SARS-CoV-2 infection and should not be used as the sole basis for treatment or other patient management decisions.  A negative result may occur with improper specimen collection / handling, submission of specimen other than nasopharyngeal swab, presence of viral mutation(s) within the areas targeted by this assay, and inadequate number of viral copies (<250 copies / mL). A negative result must be combined with clinical observations, patient history, and epidemiological information. Fact Sheet for Patients:   StrictlyIdeas.no Fact Sheet for Healthcare Providers: BankingDealers.co.za This test is not yet approved or cleared  by the [place] FDA and has been authorized for detection and/or  diagnosis of SARS-CoV-2 by FDA under an Emergency Use Authorization (EUA).  This EUA will remain in effect (meaning this test can be used) for the duration of the COVID-19 declaration under Section 564(b)(1) of the Act, 21 U.S.C. section 360bbb-3(b)(1), unless the authorization is terminated or revoked sooner. Performed at [facility], [place] [address], [place], [place] [address]   Blood Culture (routine x 2)     Status: None (Preliminary result)   Collection Time: 11/02/19 10:05 PM   Specimen: BLOOD  Result Value Ref Range Status   Specimen Description BLOOD SITE NOT SPECIFIED  Final   Special Requests   Final    BOTTLES DRAWN AEROBIC AND ANAEROBIC Blood Culture adequate volume   Culture   Final    NO GROWTH 3 DAYS Performed at [facility], [address] [place], [place] [address]    Report Status PENDING  Incomplete    Radiology Reports US RENAL  Result Date: 11/02/2019 CLINICAL DATA:  Initial evaluation for acute renal insufficiency. EXAM: RENAL / URINARY TRACT ULTRASOUND COMPLETE COMPARISON:  Prior CT from 01/12/2011. FINDINGS: Right Kidney: Renal measurements: 10.8 x 5.9 x 6.1 cm = volume: 200.5 mL. Mild diffuse cortical thinning seen about the right kidney. Renal echogenicity mildly increased. No nephrolithiasis or hydronephrosis. No focal renal mass. Left Kidney: Renal measurements: 12.0 x 6.3 x 4.5 cm = volume: 175.8 mL. Diffuse cortical thinning with mildly increased echogenicity within the renal parenchyma. No nephrolithiasis or hydronephrosis. No focal renal mass. Bladder: Small amount of layering echogenic debris noted within the bladder lumen. Other: None. IMPRESSION: 1. Diffuse cortical thinning with mildly increased echogenicity within the renal parenchyma, consistent with chronic medical renal disease. 2. No hydronephrosis. 3.  Small amount of layering echogenic material/debris within the bladder lumen. Correlation with urinalysis recommended. Electronically  Signed   By: Jeannine Boga M.D.   On: 11/02/2019 22:45   DG Chest Port 1 View  Result Date: 11/02/2019 CLINICAL DATA:  Fever. EXAM: PORTABLE CHEST 1 VIEW COMPARISON:  January 09, 2011 FINDINGS: There is a patchy airspace opacity involving the medial right lung base. There is no pneumothorax. No large pleural effusion. The heart size is stable from prior study. There is no acute osseous abnormality. Aortic calcifications are noted. IMPRESSION: Patchy airspace opacity involving the medial right lung base, suspicious for pneumonia. Electronically Signed   By: Constance Holster M.D.   On: 11/02/2019 19:59   DG Foot Complete Left  Result Date: 11/02/2019 CLINICAL DATA:  Left foot and left ankle swelling. EXAM: LEFT FOOT - COMPLETE 3+ VIEW COMPARISON:  None. FINDINGS: There is no evidence of acute fracture or dislocation. Moderate severity degenerative changes seen along the dorsal aspect of the mid left foot. A moderate to large plantar calcaneal spur is seen. Marked severity vascular calcification is noted. Mild to moderate severity diffuse soft tissue swelling is seen. IMPRESSION: Moderate severity degenerative changes, with diffuse soft tissue swelling. Electronically Signed   By: Virgina Norfolk M.D.   On: 11/02/2019 20:21     Bonnielee Haff M.D on 11/06/2019 at 1:00 PM  Pager: [email or web address]   [facility] -  Office  [phone]

## 2019-11-06 NOTE — TOC Progression Note (Addendum)
Transition of Care [facility]) - Progression Note    Patient Details  Name: Micheal Walls MRN: [id number] Date of Birth: 22-Apr-1936  Transition of Care [facility]) CM/SW [place], [place] Phone Number: 11/06/2019, 8:58 AM  Clinical Narrative:     Spoke with pt daughter Micheal Walls. Her phone is still blocking calls and she states texting her will still come through and she can call back. Family is choosing between [facility] and [facility]. Daughter is still waiting on info from [facility] and will call back with decision later in the day.   1555: Called daughter. States she still needs to discuss with her brother and that they will decide later in the evening.   Expected Discharge Plan: [place] Barriers to Discharge: Continued Medical Work up  Expected Discharge Plan and Services Expected Discharge Plan: [place] In-house Referral: Clinical Social Work     Living arrangements for the past 2 months: Single Family Home                                       Social Determinants of Health (SDOH) Interventions    Readmission Risk Interventions No flowsheet data found.

## 2019-11-07 LAB — CBC
HCT: 35.4 % — ABNORMAL LOW (ref 39.0–52.0)
Hemoglobin: 11.4 g/dL — ABNORMAL LOW (ref 13.0–17.0)
MCH: 31.1 pg (ref 26.0–34.0)
MCHC: 32.2 g/dL (ref 30.0–36.0)
MCV: 96.7 fL (ref 80.0–100.0)
Platelets: 142 10*3/uL — ABNORMAL LOW (ref 150–400)
RBC: 3.66 MIL/uL — ABNORMAL LOW (ref 4.22–5.81)
RDW: 15.7 % — ABNORMAL HIGH (ref 11.5–15.5)
WBC: 7 10*3/uL (ref 4.0–10.5)
nRBC: 0 % (ref 0.0–0.2)

## 2019-11-07 LAB — CULTURE, BLOOD (ROUTINE X 2)
Culture: NO GROWTH
Culture: NO GROWTH
Special Requests: ADEQUATE

## 2019-11-07 LAB — BASIC METABOLIC PANEL
Anion gap: 11 (ref 5–15)
BUN: 45 mg/dL — ABNORMAL HIGH (ref 8–23)
CO2: 22 mmol/L (ref 22–32)
Calcium: 10.3 mg/dL (ref 8.9–10.3)
Chloride: 107 mmol/L (ref 98–111)
Creatinine, Ser: 2.15 mg/dL — ABNORMAL HIGH (ref 0.61–1.24)
GFR calc Af Amer: 32 mL/min — ABNORMAL LOW (ref >60)
GFR calc non Af Amer: 28 mL/min — ABNORMAL LOW (ref >60)
Glucose, Bld: 142 mg/dL — ABNORMAL HIGH (ref 70–99)
Potassium: 4.5 mmol/L (ref 3.5–5.1)
Sodium: 140 mmol/L (ref 135–145)

## 2019-11-07 LAB — GLUCOSE, CAPILLARY
Glucose-Capillary: 168 mg/dL — ABNORMAL HIGH (ref 70–99)
Glucose-Capillary: 162 mg/dL — ABNORMAL HIGH (ref 70–99)
Glucose-Capillary: 155 mg/dL — ABNORMAL HIGH (ref 70–99)
Glucose-Capillary: 232 mg/dL — ABNORMAL HIGH (ref 70–99)

## 2019-11-07 LAB — SARS CORONAVIRUS 2 BY RT PCR (HOSPITAL ORDER, PERFORMED IN ~~LOC~~ HOSPITAL LAB): SARS Coronavirus 2: NEGATIVE

## 2019-11-07 MED ORDER — AMOXICILLIN-POT CLAVULANATE 875-125 MG PO TABS: 1.0000 | ORAL_TABLET | Freq: Two times a day (BID) | ORAL | 0 refills | Status: AC

## 2019-11-07 MED ORDER — FUROSEMIDE 40 MG PO TABS
40.0000 mg | ORAL_TABLET | Freq: Every day | ORAL | Status: DC
  Administered 2019-11-08: 40 mg via ORAL
  Filled 2019-11-07: qty 1

## 2019-11-07 MED ORDER — FUROSEMIDE 40 MG PO TABS: 40.0000 mg | ORAL_TABLET | Freq: Every day | ORAL | 0 refills | Status: DC

## 2019-11-07 MED ORDER — ROSUVASTATIN CALCIUM 10 MG PO TABS: 10.0000 mg | ORAL_TABLET | Freq: Every day | ORAL | 1 refills | Status: DC

## 2019-11-07 NOTE — TOC Transition Note (Signed)
Transition of Care [facility]) - CM/SW Discharge Note   Patient Details  Name: Micheal Walls MRN: [id number] Date of Birth: [birth year]  Transition of Care [facility]) CM/SW Contact:  Bethann Berkshire, [place] Phone Number: 11/07/2019, 4:01 PM   Clinical Narrative:     Patient will DC to: [facility] Anticipated DC date: 11/07/19 Family notified: [name]  Transport by: Corey Harold   Per MD patient ready for DC to [facility] . RN, patient, patient's family, and facility notified of DC. Discharge Summary and FL2 sent to facility. RN to call report prior to discharge (Room 117 [phone]). DC packet on chart. Ambulance transport to be requested for patient.   CSW will sign off for now as social work intervention is no longer needed. Please consult [place] again if new needs arise.   Final next level of care: Skilled Nursing Facility Barriers to Discharge: No Barriers Identified   Patient Goals and CMS Choice   CMS Medicare.gov Compare Post Acute Care list provided to:: Patient Choice offered to / list presented to : Adult Children(Marilyn [name])  Discharge Placement              Patient chooses bed at: [facility] Patient to be transferred to facility by: [place] Name of family member notified: [name] Patient and family notified of of transfer: 11/07/19  Discharge Plan and Services In-house Referral: Clinical Social Work                                   Social Determinants of Health ([place]) Interventions     Readmission Risk Interventions No flowsheet data found.

## 2019-11-07 NOTE — Progress Notes (Signed)
RT placed pt on CPAP dream station for the night on his home setting of 8 cmH2O w/no oxygen bled into the unit. Pt respiratory status is stable at this time. No distress noted. RT will continue to monitor.

## 2019-11-07 NOTE — Discharge Instructions (Signed)
Aspiration Pneumonia Aspiration pneumonia is an infection in the lungs. It occurs when saliva or liquid contaminated with bacteria is inhaled (aspirated) into the lungs. When these things get into the lungs, swelling (inflammation) and infection can occur. This can make it difficult to breathe. Aspiration pneumonia is a serious condition and can be life threatening. What are the causes? This condition is caused when saliva or liquid from the mouth, throat, or stomach is inhaled into the lungs, and when those fluids are contaminated with bacteria. What increases the risk? The following factors may make you more likely to develop this condition:  A narrowing of the tube that carries food to the stomach (esophageal narrowing).  Having gastroesophageal reflux disease (GERD).  Having a weak immune system.  Having diabetes.  Having poor oral hygiene.  Being malnourished. The condition is more likely to occur when a person's cough (gag) reflex, or ability to swallow, has decreased. Some things that can cause this decrease include:  Having a brain injury or disease, such as stroke, seizures, Parkinson disease, dementia, or amyotrophic lateral sclerosis (ALS).  Being given a general anesthetic for procedures.  Drinking too much alcohol. If a person passes out and vomits, vomit can be inhaled into the lungs.  Taking certain medicines, such as tranquilizers or sedatives. What are the signs or symptoms? Symptoms of this condition include:  Fever.  A cough with secretions that are yellow, tan, or green.  Breathing problems, such as wheezing or shortness of breath.  Chest pain.  Being more tired than usual (fatigue).  Having a history of coughing while eating or drinking.  Bad breath.  Bluish color to the lips, skin, or fingers. How is this diagnosed? This condition may be diagnosed based on:  A physical exam.  Tests, such as: ? Chest X-ray. ? Sputum culture. Saliva and mucus  (sputum) are collected from the lungs or the tubes that carry air to the lungs (bronchi). The sputum is then tested for bacteria. ? Oximetry. A sensor or clip is placed on areas such as a finger, earlobe, or toe to measure the oxygen level in your blood. ? Blood tests. ? Swallowing study. This test looks at how food is swallowed and whether it goes into your breathing tube (trachea) or esophagus. ? Bronchoscopy. This test uses a flexible tube (bronchoscope) to see inside the lungs. How is this treated? This condition may be treated with:  Medicines. Antibiotic medicine will be given to kill the pneumonia bacteria. Other medicines may also be used to reduce fever or pain.  Breathing assistance and oxygen therapy. Depending on how well you are breathing, you may need to be given oxygen, or you may need breathing support from a breathing machine (ventilator).  Thoracentesis. This is a procedure to remove fluid that has built up in the space between the linings of the chest wall and the lungs.  Feeding tube and diet change. For people who have difficulty swallowing, a feeding tube might be placed in the stomach, or they may be asked to avoid certain food textures or liquids when eating. Follow these instructions at home: Medicines  Take over-the-counter and prescription medicines only as told by your health care provider. ? If you were prescribed an antibiotic medicine, take it as told by your health care provider. Do not stop taking the antibiotic even if you start to feel better. ? Take cough medicine only if you are losing sleep. Cough medicine can prevent your body's natural ability to remove mucus  from your lungs. General instructions  Carefully follow any eating instructions you were given, such as avoiding certain food textures or thickening your liquids. Thickening liquids reduces the risk of developing aspiration pneumonia again.  Use breathing exercises such as postural drainage, deep  breathing, and incentive spirometry to help expel secretions.  Rest as instructed by your health care provider.  Sleep in a semi-upright position at night. Try to sleep in a reclining chair, or place a few pillows under your head.  Do not use any products that contain nicotine or tobacco, such as cigarettes and e-cigarettes. If you need help quitting, ask your health care provider.  Keep all follow-up visits as told by your health care provider. This is important. Contact a health care provider if:  You have a fever.  You have a worsening cough with yellow, tan, or green secretions.  You have coughing while eating or drinking. Get help right away if:  You have worsening shortness of breath, wheezing, or difficulty breathing.  You have chest pain. Summary  Aspiration pneumonia is an infection in the lungs. It is caused when saliva or liquid from the mouth, throat, or stomach is inhaled into the lungs.  Aspiration pneumonia is more likely to occur when a person's cough reflex or ability to swallow has decreased.  Symptoms of aspiration pneumonia include coughing, breathing problems, fever, and chest pain.  Aspiration pneumonia may be treated with antibiotic medicine, other medicines to reduce pain or fever, and breathing assistance or oxygen therapy. This information is not intended to replace advice given to you by your health care provider. Make sure you discuss any questions you have with your health care provider. Document Revised: 05/12/2017 Document Reviewed: 07/05/2016 Elsevier Patient Education  2020 [employer].

## 2019-11-07 NOTE — Discharge Summary (Signed)
[facility]  Physician Discharge Summary   Patient ID: Micheal Walls MRN: [id number] DOB/AGE: 82-Sep-1937 82 y.o.  Admit date: 11/02/2019 Discharge date: 11/07/2019  PCP: [name], [name]  DISCHARGE DIAGNOSES:  Aspiration pneumonia Severe sepsis, resolved Mild cellulitis of the left great toe, resolved Chronic kidney disease stage III Acute kidney injury, resolved Obstructive sleep apnea Insulin-dependent diabetes mellitus, controlled Chronic diastolic CHF, well compensated Essential hypertension  RECOMMENDATIONS FOR OUTPATIENT FOLLOW UP: 1. Please continue with CPAP nightly 2. Check CBC and basic metabolic panel in 1 week 3. Monitor CBGs    CODE STATUS: Full code  DISCHARGE CONDITION: fair  Diet recommendation: Modified carbohydrate  INITIAL HISTORY: Micheal Bathe Huntleyis a 84 y.o.malewith medical history significant ofanemia, chronic diastolic CHF, insulin-dependent diabetes, CKD 3, gout, hyperlipidemia, diverticulosis, OSA on CPAP, chronic left big toe wound followed by podiatry, history of right BKA presenting for evaluation of generalized weakness and hypotension.  As well he was noted to be febrile 100.7, with leukocytosis, work-up significant for pneumonia, he was admitted for further work-up .  Consultations:  None  Procedures:  None  HOSPITAL COURSE:   Severe sepsis secondary tosuspected aspiration pneumonia: - Febrile, slightly tachycardic, and tachypneic on arrival. Hypotensive with EMS.  improved after a total of 2 L fluid boluses. Labs showing leukocytosis and lactic acidosis. Chest x-ray showing patchy airspace opacity involving the medial right lung base suspicious for pneumonia.  -Given its right lung opacity, concern for aspiration pneumonia even though patient denies any dysphagia or aspiration symptoms, will continue with IV Unasyn for now given his sepsis. -Patient was encouraged use incentive spirometry, flutter valve,  discussed with staff, they will try to get out of bed to chair as well. -SARS-CoV-2 PCR test negative.  Patient was transitioned to oral antibiotics.  Patient has improved.  Saturating normal on room air.  Mildcellulitisof the left great toe: -appears to be mild, x-ray showing no evidence of osteomyelitis, should be covered with current regimen .  AKI on CKD stage III:  -Likely prerenal due to severe sepsis/hypotension.BUN 62, creatinine 3.7. Creatinine previously ranging between 1.9-2.4. -Resolved with IV hydration. -Continue to hold losartan.  Check renal function at the skilled nursing facility in 1 week. Diuretics being continued at a lower dose.  OSA: Continue CPAP at night  Well-controlled insulin-dependent diabetes:  A1c 6.9 on 09/24/2019 Continue home medication regimen.  Generalized weakness: -  Suspect related to acute illness.  He is also status post right BKA, PT has seen patient, recommendation for SNF.  Emesis:  Now resolved.  Chronic diastolic CHF: Stable.  Well compensated.  Back on diuretics at a lower dose.  Essential hypertension Stable currently.  Occasional high readings noted.  Obesity Estimated body mass index is 37.6 kg/m as calculated from the following:   Height as of this encounter: $RemoveBeforeD'6\' 1"'LSLuhUhyoyFTeU$  (1.660 m).   Weight as of this encounter: 129.3 kg.   Overall stable.  Okay for discharge to skilled nursing facility.   PERTINENT LABS:  The results of significant diagnostics from this hospitalization (including imaging, microbiology, ancillary and laboratory) are listed below for reference.    Microbiology: Recent Results (from the past 240 hour(s))  Blood Culture (routine x 2)     Status: None   Collection Time: 11/02/19  7:00 PM   Specimen: BLOOD RIGHT HAND  Result Value Ref Range Status   Specimen Description BLOOD RIGHT HAND  Final   Special Requests   Final    BOTTLES DRAWN AEROBIC AND ANAEROBIC Blood  Culture results may not be  optimal due to an excessive volume of blood received in culture bottles   Culture   Final    NO GROWTH 5 DAYS Performed at [facility], [place] [address], [place], [place] [address]    Report Status 11/07/2019 FINAL  Final  SARS Coronavirus 2 by RT PCR (hospital order, performed in [facility] hospital lab) Nasopharyngeal Nasopharyngeal Swab     Status: None   Collection Time: 11/02/19  8:03 PM   Specimen: Nasopharyngeal Swab  Result Value Ref Range Status   SARS Coronavirus 2 NEGATIVE NEGATIVE Final    Comment: (NOTE) SARS-CoV-2 target nucleic acids are NOT DETECTED. The SARS-CoV-2 RNA is generally detectable in upper and lower respiratory specimens during the acute phase of infection. The lowest concentration of SARS-CoV-2 viral copies this assay can detect is 250 copies / mL. A negative result does not preclude SARS-CoV-2 infection and should not be used as the sole basis for treatment or other patient management decisions.  A negative result may occur with improper specimen collection / handling, submission of specimen other than nasopharyngeal swab, presence of viral mutation(s) within the areas targeted by this assay, and inadequate number of viral copies (<250 copies / mL). A negative result must be combined with clinical observations, patient history, and epidemiological information. Fact Sheet for Patients:   StrictlyIdeas.no Fact Sheet for Healthcare Providers: BankingDealers.co.za This test is not yet approved or cleared  by the [place] FDA and has been authorized for detection and/or diagnosis of SARS-CoV-2 by FDA under an Emergency Use Authorization (EUA).  This EUA will remain in effect (meaning this test can be used) for the duration of the COVID-19 declaration under Section 564(b)(1) of the Act, 21 U.S.C. section 360bbb-3(b)(1), unless the authorization is terminated or revoked sooner. Performed at [facility], [place] [address] [place], [place] [address]   Blood Culture (routine x 2)     Status: None   Collection Time: 11/02/19 10:05 PM   Specimen: BLOOD  Result Value Ref Range Status   Specimen Description BLOOD SITE NOT SPECIFIED  Final   Special Requests   Final    BOTTLES DRAWN AEROBIC AND ANAEROBIC Blood Culture adequate volume   Culture   Final    NO GROWTH 5 DAYS Performed at [facility], [address], [place], [place] [address]    Report Status 11/07/2019 FINAL  Final     Labs:    Basic Metabolic Panel: Recent Labs  Lab 11/03/19 0625 11/04/19 0312 11/05/19 0337 11/06/19 0314 11/07/19 0410  NA 141 144 143 143 140  K 3.6 3.9 4.5 4.7 4.5  CL 110 110 112* 111 107  CO2 $Re'23 24 23 23 22  'hAJ$ GLUCOSE 151* 117* 134* 134* 142*  BUN 64* 54* 44* 41* 45*  CREATININE 3.16* 2.57* 2.03* 1.97* 2.15*  CALCIUM 9.3 9.9 10.3 10.6* 10.3   Liver Function Tests: Recent Labs  Lab 11/02/19 1900  AST 44*  ALT 23  ALKPHOS 47  BILITOT 0.9  PROT 6.1*  ALBUMIN 3.0*   Recent Labs  Lab 11/03/19 0625  LIPASE 27   CBC: Recent Labs  Lab 11/02/19 1900 11/02/19 1900 11/03/19 0625 11/04/19 0312 11/05/19 0337 11/06/19 0314 11/07/19 0410  WBC 18.9*   < > 17.8* 15.0* 9.3 7.8 7.0  NEUTROABS 17.2*  --   --   --   --   --   --   HGB 11.2*   < >  10.7* 11.0* 11.3* 11.5* 11.4*  HCT 35.1*   < > 33.1* 33.7* 34.7* 35.5* 35.4*  MCV 99.4   < > 97.4 96.8 96.4 96.7 96.7  PLT 146*   < > 125* 136* 125* 127* 142*   < > = values in this interval not displayed.    CBG: Recent Labs  Lab 11/06/19 0850 11/06/19 1204 11/06/19 1608 11/06/19 2239 11/07/19 0826  GLUCAP 168* 137* 172* 149* 168*     IMAGING STUDIES US RENAL  Result Date: 11/02/2019 CLINICAL DATA:  Initial evaluation for acute renal insufficiency. EXAM: RENAL / URINARY TRACT ULTRASOUND COMPLETE COMPARISON:  Prior CT from 01/12/2011. FINDINGS: Right Kidney: Renal measurements: 10.8 x 5.9 x 6.1 cm = volume: 200.5 mL.  Mild diffuse cortical thinning seen about the right kidney. Renal echogenicity mildly increased. No nephrolithiasis or hydronephrosis. No focal renal mass. Left Kidney: Renal measurements: 12.0 x 6.3 x 4.5 cm = volume: 175.8 mL. Diffuse cortical thinning with mildly increased echogenicity within the renal parenchyma. No nephrolithiasis or hydronephrosis. No focal renal mass. Bladder: Small amount of layering echogenic debris noted within the bladder lumen. Other: None. IMPRESSION: 1. Diffuse cortical thinning with mildly increased echogenicity within the renal parenchyma, consistent with chronic medical renal disease. 2. No hydronephrosis. 3. Small amount of layering echogenic material/debris within the bladder lumen. Correlation with urinalysis recommended. Electronically Signed   By: Jeannine Boga M.D.   On: 11/02/2019 22:45   DG Chest Port 1 View  Result Date: 11/02/2019 CLINICAL DATA:  Fever. EXAM: PORTABLE CHEST 1 VIEW COMPARISON:  January 09, 2011 FINDINGS: There is a patchy airspace opacity involving the medial right lung base. There is no pneumothorax. No large pleural effusion. The heart size is stable from prior study. There is no acute osseous abnormality. Aortic calcifications are noted. IMPRESSION: Patchy airspace opacity involving the medial right lung base, suspicious for pneumonia. Electronically Signed   By: Constance Holster M.D.   On: 11/02/2019 19:59   DG Foot Complete Left  Result Date: 11/02/2019 CLINICAL DATA:  Left foot and left ankle swelling. EXAM: LEFT FOOT - COMPLETE 3+ VIEW COMPARISON:  None. FINDINGS: There is no evidence of acute fracture or dislocation. Moderate severity degenerative changes seen along the dorsal aspect of the mid left foot. A moderate to large plantar calcaneal spur is seen. Marked severity vascular calcification is noted. Mild to moderate severity diffuse soft tissue swelling is seen. IMPRESSION: Moderate severity degenerative changes, with diffuse soft  tissue swelling. Electronically Signed   By: Virgina Norfolk M.D.   On: 11/02/2019 20:21    DISCHARGE EXAMINATION: Vitals:   11/06/19 0852 11/06/19 1612 11/06/19 2237 11/07/19 0856  BP: 131/69 118/60 (!) 150/48 112/65  Pulse: 87 75 79 82  Resp: $Remo'20 18 20 16  'OUshE$ Temp: 97.9 F (36.6 C) 98 F (36.7 C) 97.8 F (36.6 C) 97.8 F (36.6 C)  TempSrc:   Oral Oral  SpO2: 99% 98% 99% 97%  Weight:      Height:       General appearance: Awake alert.  In no distress Resp: Clear to auscultation bilaterally.  Normal effort Cardio: S1-S2 is normal regular.  No S3-S4.  No rubs murmurs or bruit GI: Abdomen is soft.  Nontender nondistended.  Bowel sounds are present normal.  No masses organomegaly    DISPOSITION: SNF  Discharge Instructions    Call MD for:  difficulty breathing, headache or visual disturbances   Complete by: As directed    Call MD for:  extreme fatigue   Complete by: As directed    Call MD for:  persistant dizziness or light-headedness   Complete by: As directed    Call MD for:  persistant nausea and vomiting   Complete by: As directed    Call MD for:  severe uncontrolled pain   Complete by: As directed    Call MD for:  temperature >100.4   Complete by: As directed    Discharge instructions   Complete by: As directed    Please review instructions on the discharge summary.  You were cared for by a hospitalist during your hospital stay. If you have any questions about your discharge medications or the care you received while you were in the hospital after you are discharged, you can call the unit and asked to speak with the hospitalist on call if the hospitalist that took care of you is not available. Once you are discharged, your primary care physician will handle any further medical issues. Please note that NO REFILLS for any discharge medications will be authorized once you are discharged, as it is imperative that you return to your primary care physician (or establish a  relationship with a primary care physician if you do not have one) for your aftercare needs so that they can reassess your need for medications and monitor your lab values. If you do not have a primary care physician, you can call [phone] for a physician referral.   Increase activity slowly   Complete by: As directed         Allergies as of 11/07/2019   No Known Allergies     Medication List    STOP taking these medications   losartan 100 MG tablet Commonly known as: COZAAR   losartan 50 MG tablet Commonly known as: COZAAR     TAKE these medications   Alaway 0.025 % ophthalmic solution Generic drug: ketotifen Place 1 drop into both eyes 2 (two) times daily.   allopurinol 100 MG tablet Commonly known as: ZYLOPRIM Take 1 tablet (100 mg total) by mouth daily. What changed: when to take this   amoxicillin-clavulanate 875-125 MG tablet Commonly known as: AUGMENTIN Take 1 tablet by mouth every 12 (twelve) hours for 3 days.   antiseptic oral rinse Liqd 15 mLs by Mouth Rinse route as needed for dry mouth.   aspirin 81 MG tablet Take 162 mg by mouth daily.   Castellani Paint Modified 1.5 % Liqd Use a cotton tip applicator. Paint between toes of left foot as needed once daily.   colchicine 0.6 MG tablet Take 0.6 mg by mouth as needed (gout flare up).   diltiazem 300 MG 24 hr capsule Commonly known as: CARDIZEM CD TAKE ONE CAPSULE EACH DAY What changed: See the new instructions.   dorzolamide 2 % ophthalmic solution Commonly known as: TRUSOPT Place 1 drop into both eyes 2 (two) times daily.   ferrous sulfate 325 (65 FE) MG tablet Take 325 mg by mouth daily with breakfast.   fish oil-omega-3 fatty acids 1000 MG capsule Take 2 g by mouth daily.   FreeStyle Precision Neo Test test strip Generic drug: glucose blood Use as instructed   furosemide 40 MG tablet Commonly known as: LASIX Take 1 tablet (40 mg total) by mouth daily. What changed: when to take this     gabapentin 100 MG capsule Commonly known as: Neurontin Take 1 capsule (100 mg total) by mouth 3 (three) times daily.   hydrocortisone 25 MG suppository Commonly known  as: ANUSOL-HC Place 25 mg rectally 2 (two) times daily as needed for hemorrhoids or anal itching.   latanoprost 0.005 % ophthalmic solution Commonly known as: XALATAN Place 1 drop into both eyes at bedtime.   multivitamin tablet Take 1 tablet by mouth daily.   niacin 500 MG CR tablet Commonly known as: NIASPAN TAKE 2 TABLETS AT BEDTIME   Ozempic ([name]) 2 MG/1.5ML Sopn Generic drug: Semaglutide ([name]) Inject 1 mg into the skin once a week.   pantoprazole 40 MG tablet Commonly known as: PROTONIX Take 1 tablet (40 mg total) by mouth daily.   Pramosone cream Generic drug: pramoxine-hydrocortisone Apply topically 3 (three) times daily. Apply topically to hemorrhoids four times daily as needed after bowel movements, for external use only   REFRESH OP Apply 1 drop to eye 2 (two) times daily.   rosuvastatin 10 MG tablet Commonly known as: CRESTOR Take 1 tablet (10 mg total) by mouth daily. What changed: when to take this        Follow-up Information    [name], [place]. Schedule an appointment as soon as possible for a visit in 2 week(s).   Specialty: General Practice Contact information: [address] [place] [address] [phone]           TOTAL DISCHARGE TIME: 46 minutes  [place]  [facility] Pager on [email or web address]  11/07/2019, 10:29 AM

## 2019-11-07 NOTE — Progress Notes (Signed)
Physical Therapy Treatment Patient Details Name: Micheal Walls MRN: [id number] DOB: 04/06/1936 Today's Date: 11/07/2019    History of Present Illness 84 y.o. 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    PT Comments    Pt able to follow cues and direction to work on sequencing and gait stability in the RW much more successfully today.    Follow Up Recommendations  SNF;Supervision/Assistance - 24 hour     Equipment Recommendations  None recommended by PT    Recommendations for Other Services       Precautions / Restrictions Precautions Precautions: Fall Restrictions Weight Bearing Restrictions: Yes LUE Weight Bearing: Weight bearing as tolerated    Mobility  Bed Mobility               General bed mobility comments: up in the chair on arrival  Transfers Overall transfer level: Needs assistance Equipment used: Rolling walker (2 wheeled) Transfers: Sit to/from Stand Sit to Stand: Mod assist;+2 safety/equipment         General transfer comment: cues for hand placement,  assist to come forward and boost.  Ambulation/Gait Ambulation/Gait assistance: Mod assist;+2 safety/equipment Gait Distance (Feet): 8 Feet(then 10' and 12' with rest in between) Assistive device: Rolling walker (2 wheeled) Gait Pattern/deviations: Step-through pattern;Decreased step length - right;Decreased step length - left;Decreased stance time - left;Decreased stance time - right;Decreased stride length     General Gait Details: worked on w/shift and stepping, stay upright and sequencing with the RW.  Pt needing stability and w/shift assist.  Pt was more easily assisted to sequence and stay upright.   Stairs             Wheelchair  Mobility    Modified Rankin (Stroke Patients Only)       Balance     Sitting balance-Leahy Scale: Good       Standing balance-Leahy Scale: Poor                              Cognition Arousal/Alertness: Awake/alert Behavior During Therapy: WFL for tasks assessed/performed Overall Cognitive Status: Within Functional Limits for tasks assessed                                 General Comments: Very pleasant and cooperative.      Exercises      General Comments        Pertinent Vitals/Pain Pain Assessment: No/denies pain    Home Living                      Prior Function            PT Goals (current goals can now be found in the care plan section) Acute Rehab PT Goals PT Goal Formulation: With patient Time For Goal Achievement: 11/17/19 Potential to Achieve Goals: Good Progress towards PT goals: Progressing toward goals    Frequency    Min 2X/week      PT Plan Current plan remains appropriate    Co-evaluation              AM-PAC PT "6 Clicks" Mobility   Outcome Measure  Help needed turning from your  back to your side while in a flat bed without using bedrails?: A Little Help needed moving from lying on your back to sitting on the side of a flat bed without using bedrails?: A Little Help needed moving to and from a bed to a chair (including a wheelchair)?: A Lot Help needed standing up from a chair using your arms (e.g., wheelchair or bedside chair)?: A Lot Help needed to walk in hospital room?: A Lot Help needed climbing 3-5 steps with a railing? : Total 6 Click Score: 13    End of Session   Activity Tolerance: Patient tolerated treatment well Patient left: in chair;with call bell/phone within reach Nurse Communication: Mobility status PT Visit Diagnosis: Other abnormalities of gait and mobility (R26.89);Difficulty in walking, not elsewhere classified (R26.2);Muscle weakness (generalized) (M62.81)      Time: 1597-3312 PT Time Calculation (min) (ACUTE ONLY): 22 min  Charges:  $Gait Training: 8-22 mins                     11/07/2019  Ginger Carne., PT Acute Rehabilitation Services [phone]  (pager) [phone]  (office)   [name] 11/07/2019, 3:05 PM

## 2019-11-07 NOTE — Progress Notes (Signed)
Unable to discharge patient or provide transport through [facility] due to patient medical equipment (CPAP) was not delivered to facility by family. Spoke with [name] (wife) via phone. She stated that family is  unable to provide needed equipment this evening. Dr Sidney Ace notified. Patient remains in hospital to receive CPAP tx overnight.

## 2019-11-08 LAB — GLUCOSE, CAPILLARY: Glucose-Capillary: 156 mg/dL — ABNORMAL HIGH (ref 70–99)

## 2019-11-08 LAB — CBC
HCT: 35.5 % — ABNORMAL LOW (ref 39.0–52.0)
Hemoglobin: 11.5 g/dL — ABNORMAL LOW (ref 13.0–17.0)
MCH: 31.9 pg (ref 26.0–34.0)
MCHC: 32.4 g/dL (ref 30.0–36.0)
MCV: 98.3 fL (ref 80.0–100.0)
Platelets: 127 10*3/uL — ABNORMAL LOW (ref 150–400)
RBC: 3.61 MIL/uL — ABNORMAL LOW (ref 4.22–5.81)
RDW: 15.9 % — ABNORMAL HIGH (ref 11.5–15.5)
WBC: 7.2 10*3/uL (ref 4.0–10.5)
nRBC: 0 % (ref 0.0–0.2)

## 2019-11-08 LAB — BASIC METABOLIC PANEL
Anion gap: 9 (ref 5–15)
BUN: 53 mg/dL — ABNORMAL HIGH (ref 8–23)
CO2: 25 mmol/L (ref 22–32)
Calcium: 10.7 mg/dL — ABNORMAL HIGH (ref 8.9–10.3)
Chloride: 109 mmol/L (ref 98–111)
Creatinine, Ser: 2.34 mg/dL — ABNORMAL HIGH (ref 0.61–1.24)
GFR calc Af Amer: 29 mL/min — ABNORMAL LOW (ref >60)
GFR calc non Af Amer: 25 mL/min — ABNORMAL LOW (ref >60)
Glucose, Bld: 138 mg/dL — ABNORMAL HIGH (ref 70–99)
Potassium: 4.8 mmol/L (ref 3.5–5.1)
Sodium: 143 mmol/L (ref 135–145)

## 2019-11-08 NOTE — TOC Transition Note (Signed)
Transition of Care [facility]) - CM/SW Discharge Note   Patient Details  Name: Micheal Walls MRN: [id number] Date of Birth: [birth year]  [facility]) CM/SW Contact:  Bethann Berkshire, [place] Phone Number: 11/08/2019, 4:13 PM   Clinical Narrative:     CSW informed that pt did not transfer previous night due to facility not having CPAP and unable to accept CPAP from pt family.   CSW coordinated with Juliann Pulse at [facility] and pt daughter and son to have CPAP and belongings delivered. Once belongings were delivered, [facility] called [facility] transport for pt to [facility].   TOC Sign off.   Final next level of care: Skilled Nursing Facility Barriers to Discharge: No Barriers Identified   Patient Goals and CMS Choice   CMS Medicare.gov Compare Post Acute Care list provided to:: Patient Choice offered to / list presented to : Adult Children(Marilyn [name])  Discharge Placement              Patient chooses bed at: [facility] Patient to be transferred to facility by: [facility] Name of family member notified: [name] Patient and family notified of of transfer: 11/07/19  Discharge Plan and Services In-house Referral: Clinical Social Work                                   Social Determinants of Health ([place]) Interventions     Readmission Risk Interventions No flowsheet data found.

## 2019-11-08 NOTE — Progress Notes (Signed)
Patient could not go to his skilled nursing facility yesterday as family could not transport his CPAP machine in time.  So he stayed here overnight.  No new complaints this morning.  Continues to have some cough.  He was reassured.  Otherwise he is noted to be stable.  Feels well.  Ready to go to rehab.  See discharge summary from yesterday.  No changes.  No charge note.  Bonnielee Haff 11/08/2019

## 2019-11-08 NOTE — Care Management Important Message (Signed)
Important Message  Patient Details  Name: Micheal Walls MRN: [id number] Date of Birth: Jan 24, 1936   Medicare Important Message Given:  Yes  Patient left prior to IM delivery.  IM mailed to the patient home.    [name] 11/08/2019, 2:56 PM

## 2019-11-18 ENCOUNTER — Ambulatory Visit: Payer: Medicare Other | Admitting: Cardiovascular Disease

## 2019-11-25 DIAGNOSIS — I5032 Chronic diastolic (congestive) heart failure: Secondary | ICD-10-CM | POA: Diagnosis not present

## 2019-11-25 DIAGNOSIS — L03116 Cellulitis of left lower limb: Secondary | ICD-10-CM | POA: Diagnosis not present

## 2019-11-25 DIAGNOSIS — E119 Type 2 diabetes mellitus without complications: Secondary | ICD-10-CM | POA: Diagnosis not present

## 2019-11-25 DIAGNOSIS — H409 Unspecified glaucoma: Secondary | ICD-10-CM | POA: Diagnosis not present

## 2019-11-25 DIAGNOSIS — M1A3721 Chronic gout due to renal impairment, left ankle and foot, with tophus (tophi): Secondary | ICD-10-CM | POA: Diagnosis not present

## 2019-11-25 DIAGNOSIS — N1831 Chronic kidney disease, stage 3a: Secondary | ICD-10-CM | POA: Diagnosis not present

## 2019-11-25 DIAGNOSIS — I10 Essential (primary) hypertension: Secondary | ICD-10-CM | POA: Diagnosis not present

## 2019-11-25 DIAGNOSIS — M6281 Muscle weakness (generalized): Secondary | ICD-10-CM | POA: Diagnosis not present

## 2019-11-25 DIAGNOSIS — G4733 Obstructive sleep apnea (adult) (pediatric): Secondary | ICD-10-CM | POA: Diagnosis not present

## 2019-11-25 DIAGNOSIS — D649 Anemia, unspecified: Secondary | ICD-10-CM | POA: Diagnosis not present

## 2019-11-27 ENCOUNTER — Ambulatory Visit: Payer: Medicare Other | Admitting: Orthotics

## 2019-11-27 ENCOUNTER — Other Ambulatory Visit: Payer: Self-pay

## 2019-11-27 DIAGNOSIS — E1151 Type 2 diabetes mellitus with diabetic peripheral angiopathy without gangrene: Secondary | ICD-10-CM

## 2019-11-27 DIAGNOSIS — Z89431 Acquired absence of right foot: Secondary | ICD-10-CM

## 2019-11-28 DIAGNOSIS — J690 Pneumonitis due to inhalation of food and vomit: Secondary | ICD-10-CM | POA: Diagnosis not present

## 2019-12-02 ENCOUNTER — Telehealth: Payer: Self-pay

## 2019-12-02 ENCOUNTER — Encounter (INDEPENDENT_AMBULATORY_CARE_PROVIDER_SITE_OTHER): Payer: Medicare Other | Admitting: Ophthalmology

## 2019-12-02 NOTE — Telephone Encounter (Signed)
Micheal Walls with [facility] called requesting verbal orders for 1 time a week for 1 week and 2 times a week for 7 weeks to work on balance,gait,strength,endurance  and fall prevention ok to leave v/m [phone]  I returned his call and gave verbal orders ok per JM DNP,[name]-BC. Tyler Deis

## 2019-12-02 NOTE — Telephone Encounter (Signed)
Joelene Millin occupational therapist with [name] called requesting verbal orders for home health OT 1 week 1, 2 week 3 and 1 week 2. [phone]  I RETURNED HER CALL AND GAVE VERBAL ORDERS OK. Tyler Deis

## 2019-12-10 ENCOUNTER — Telehealth: Payer: Self-pay

## 2019-12-10 ENCOUNTER — Telehealth (INDEPENDENT_AMBULATORY_CARE_PROVIDER_SITE_OTHER): Payer: Medicare Other | Admitting: Nurse Practitioner

## 2019-12-10 ENCOUNTER — Other Ambulatory Visit: Payer: Self-pay

## 2019-12-10 ENCOUNTER — Encounter: Payer: Self-pay | Admitting: Nurse Practitioner

## 2019-12-10 DIAGNOSIS — Z89611 Acquired absence of right leg above knee: Secondary | ICD-10-CM | POA: Diagnosis not present

## 2019-12-10 DIAGNOSIS — E11621 Type 2 diabetes mellitus with foot ulcer: Secondary | ICD-10-CM | POA: Diagnosis not present

## 2019-12-10 DIAGNOSIS — J690 Pneumonitis due to inhalation of food and vomit: Secondary | ICD-10-CM | POA: Diagnosis not present

## 2019-12-10 DIAGNOSIS — L97529 Non-pressure chronic ulcer of other part of left foot with unspecified severity: Secondary | ICD-10-CM

## 2019-12-10 NOTE — Telephone Encounter (Signed)
Patient consented to a virtual appointment. YL,RMA

## 2019-12-10 NOTE — Progress Notes (Addendum)
Virtual Visit via Video using email   This visit type was conducted due to national recommendations for restrictions regarding the COVID-19 Pandemic (e.g. social distancing) in an effort to limit this patient's exposure and mitigate transmission in our community.  Due to his co-morbid illnesses, this patient is at least at moderate risk for complications without adequate follow up.  This format is felt to be most appropriate for this patient at this time.  All issues noted in this document were discussed and addressed.  A limited physical exam was performed with this format.    This visit type was conducted due to national recommendations for restrictions regarding the COVID-19 Pandemic (e.g. social distancing) in an effort to limit this patient's exposure and mitigate transmission in our community.  Patients identity confirmed using two different identifiers.  This format is felt to be most appropriate for this patient at this time.  All issues noted in this document were discussed and addressed.  No physical exam was performed (except for noted visual exam findings with Video Visits).    Date:  01/21/2020   ID:  Micheal Walls, DOB 08/24/1935, MRN [id number]  Patient Location: Home - spoke with Micheal Walls   Provider location:   Office    Chief Complaint: Hospital admission follow up    History of Present Illness:    Micheal Walls is a 84 y.o. male who presents via video conferencing for a telehealth visit today.    The patient does not have symptoms concerning for COVID-19 infection (fever, chills, cough, or new shortness of breath).   Hospital follow up admission after having generalized weakness and hypotension, he was diagnosed with Sepsis and has a chronic left big toe wound followed by podiatry.  He was noted to be febrile of 100.7, with leukocytosis and a significant work up significant for pneumonia.  He was discharged to a rehab facility ([facility])for 2 weeks from  5/28 - 6/14 and is now at home.  He does have episodes of coughing after eating.    His daughter reports oozing pus on Sunday, the nurse will come on Wednesday or Thursday.  Has OT mon and wed, PT Tue and Thu.   His blood sugar has been up to 160 since being home with the lowest of 98.  He is taking the Ozempic regularly.    The only time his blood pressure was elevated was when he had a toothache. His wife and grandson are at home with him.  He is taking furosemide 2 tabs in morning and 1 at night.  Does improve with the swelling after he wakes up in the morning. His daughter is looking into meal plans for diabetics.  He is losing the weight and doing good. His daughter is encouraging.    Micheal Walls - [phone] - daughter lives in [place]    Past Medical History:  Diagnosis Date  . 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 Anemia   . Diastolic heart failure ([place])   . Diverticulosis   . DM (diabetes mellitus) ([place])   . Gout   . Hyperlipemia   . OSA on CPAP   . RBBB   . Systemic hypertension    Past Surgical History:  Procedure Laterality Date  . LEG AMPUTATION BELOW KNEE     right  . NM MYOCAR PERF WALL MOTION  09/18/2009   no ischemia     Current Meds  Medication Sig  . allopurinol (ZYLOPRIM) 100 MG  tablet Take 1 tablet (100 mg total) by mouth daily. (Patient taking differently: Take 100 mg by mouth 2 (two) times daily. )  . antiseptic oral rinse (BIOTENE) LIQD 15 mLs by Mouth Rinse Walls as needed for dry mouth.  Marland Kitchen aspirin 81 MG tablet Take 162 mg by mouth daily.  [name] Use a cotton tip applicator. Paint between toes of left foot as needed once daily. (Patient taking differently: See admin instructions. Use a cotton tip applicator. Paint between toes of left foot once daily.)  . colchicine 0.6 MG tablet Take 0.6 mg by mouth daily.   Marland Kitchen diltiazem (CARDIZEM CD) 300 MG 24 hr capsule TAKE ONE CAPSULE EACH DAY (Patient taking differently: Take  300 mg by mouth daily. )  . dorzolamide (TRUSOPT) 2 % ophthalmic solution Place 1 drop into both eyes 2 (two) times daily.  . ferrous sulfate 325 (65 FE) MG tablet Take 325 mg by mouth daily with breakfast.  . fish oil-omega-3 fatty acids 1000 MG capsule Take 1 g by mouth 2 (two) times daily.   . furosemide (LASIX) 40 MG tablet Take 1 tablet (40 mg total) by mouth daily. (Patient taking differently: Take 40 mg by mouth 2 (two) times daily. )  . glucose blood (FREESTYLE PRECISION NEO TEST) test strip Use as instructed  . hydrocortisone (ANUSOL-HC) 25 MG suppository Place 25 mg rectally 2 (two) times daily as needed for hemorrhoids or anal itching.  Marland Kitchen ketotifen (ALAWAY) 0.025 % ophthalmic solution Place 1 drop into both eyes 2 (two) times daily.  Marland Kitchen latanoprost (XALATAN) 0.005 % ophthalmic solution Place 1 drop into both eyes at bedtime.   . Multiple Vitamin (MULTIVITAMIN) tablet Take 1 tablet by mouth daily.  . niacin (NIASPAN) 500 MG CR tablet TAKE 2 TABLETS AT BEDTIME  . pramoxine-hydrocortisone (PRAMOSONE) cream Apply topically 3 (three) times daily. Apply topically to hemorrhoids four times daily as needed after bowel movements, for external use only  . rosuvastatin (CRESTOR) 10 MG tablet Take 1 tablet (10 mg total) by mouth daily. (Patient taking differently: Take 10 mg by mouth every 7 (seven) days. )  . Semaglutide, [name], (OZEMPIC, [name],) 2 MG/1.5ML SOPN Inject 1 mg into the skin once a week.  . [DISCONTINUED] gabapentin (NEURONTIN) 100 MG capsule Take 1 capsule (100 mg total) by mouth 3 (three) times daily.  . [DISCONTINUED] pantoprazole (PROTONIX) 40 MG tablet Take 1 tablet (40 mg total) by mouth daily.  . [DISCONTINUED] Polyvinyl Alcohol-Povidone (REFRESH OP) Apply 1 drop to eye 2 (two) times daily. (Patient not taking: Reported on 01/16/2020)     Allergies:   Patient has no known allergies.   Social History   Tobacco Use  . Smoking status: Former Smoker    Types: Cigars     Quit date: 06/12/2001    Years since quitting: 18.6  . Smokeless tobacco: Never Used  Vaping Use  . Vaping Use: Former  Substance Use Topics  . Alcohol use: No    Alcohol/week: 0.0 standard drinks  . Drug use: No     Family Hx: The patient's family history includes Cancer in his sister; Diabetes in his mother; Heart attack in his father.  ROS:   Please see the history of present illness.    Review of Systems  Constitutional: Negative.   Respiratory: Negative.   Cardiovascular: Negative.   Skin:       Reports he has the open wound on his left toe.  Neurological: Negative for dizziness and tingling.  Psychiatric/Behavioral: Negative.     All other systems reviewed and are negative.   Labs/Other Tests and Data Reviewed:    Recent Labs: 03/21/2019: TSH 2.760 01/17/2020: ALT 20 01/19/2020: BUN 39; Creatinine, Ser 1.80; Hemoglobin 10.3; Platelets 161; Potassium 4.0; Sodium 140   Recent Lipid Panel Lab Results  Component Value Date/Time   CHOL 138 09/24/2019 09:36 AM   TRIG 96 09/24/2019 09:36 AM   HDL 35 (L) 09/24/2019 09:36 AM   CHOLHDL 3.9 09/24/2019 09:36 AM   LDLCALC 85 09/24/2019 09:36 AM    Wt Readings from Last 3 Encounters:  01/20/20 278 lb 3.5 oz (126.2 kg)  11/02/19 285 lb (129.3 kg)  10/31/19 285 lb (129.3 kg)     Exam:    Vital Signs:  There were no vitals taken for this visit.    Physical Exam Vitals reviewed.  Constitutional:      General: He is not in acute distress.    Appearance: Normal appearance. He is obese.  Pulmonary:     Effort: Pulmonary effort is normal. No respiratory distress.  Skin:    Comments: Virtual visit  Neurological:     General: No focal deficit present.     Mental Status: He is alert and oriented to person, place, and time.     Cranial Nerves: No cranial nerve deficit.  Psychiatric:        Mood and Affect: Mood normal.        Behavior: Behavior normal.        Thought Content: Thought content normal.        Judgment:  Judgment normal.     ASSESSMENT & PLAN:    1. Aspiration pneumonia of right lower lobe, unspecified aspiration pneumonia type [facility]) He was admitted from 5/22-5/27 for severe sepsis which has resolved.  TCM Performed. A member of the clinical team spoke with the patient upon dischare. Discharge summary was reviewed in full detail during the visit. Meds reconciled and compared to discharge meds. Medication list is updated and reviewed with the patient.  Greater than 50% face to face time was spent in counseling an coordination of care.  All questions were answered to the satisfaction of the patient.  He is to go for a repeat CXR in 4 weeks - DG Chest 2 View; Future  2. Diabetic ulcer of left great toe ([place])  Continue follow up with podiatry and home health will continue to dress and monitor.  Addendum  3. Right above-knee amputee [facility])  He has lost approximately 30 lbs and is now having pain and discomfort to his stump.  Will send order to [facility] clinic for re-fitting.  Also, due to his left foot ulcer this can affect his mobility and increase his risk for falls.    COVID-19 Education: The signs and symptoms of COVID-19 were discussed with the patient and how to seek care for testing (follow up with PCP or arrange E-visit).  The importance of social distancing was discussed today.  Patient Risk:   After full review of this patients clinical status, I feel that they are at least moderate risk at this time.  Time:   Today, I have spent 23 minutes/ seconds with the patient with telehealth technology discussing above diagnoses.     Medication Adjustments/Labs and Tests Ordered: Current medicines are reviewed at length with the patient today.  Concerns regarding medicines are outlined above.   Tests Ordered: Orders Placed This Encounter  Procedures  .  DG Chest 2 View    Medication Changes: No orders of the defined types were placed in this encounter.   Disposition:  Follow up  prn  Signed, [name], [name]

## 2019-12-12 ENCOUNTER — Other Ambulatory Visit: Payer: Self-pay

## 2019-12-12 ENCOUNTER — Encounter: Payer: Self-pay | Admitting: Podiatry

## 2019-12-12 ENCOUNTER — Telehealth: Payer: Self-pay

## 2019-12-12 ENCOUNTER — Ambulatory Visit (INDEPENDENT_AMBULATORY_CARE_PROVIDER_SITE_OTHER): Payer: Medicare Other | Admitting: Podiatry

## 2019-12-12 DIAGNOSIS — L97522 Non-pressure chronic ulcer of other part of left foot with fat layer exposed: Secondary | ICD-10-CM

## 2019-12-12 DIAGNOSIS — Z89511 Acquired absence of right leg below knee: Secondary | ICD-10-CM | POA: Diagnosis not present

## 2019-12-12 DIAGNOSIS — E1151 Type 2 diabetes mellitus with diabetic peripheral angiopathy without gangrene: Secondary | ICD-10-CM | POA: Diagnosis not present

## 2019-12-12 DIAGNOSIS — E1142 Type 2 diabetes mellitus with diabetic polyneuropathy: Secondary | ICD-10-CM | POA: Diagnosis not present

## 2019-12-12 NOTE — Telephone Encounter (Signed)
Joelene Millin with [facility] called wanting verbal orders for social work eval. [phone]  I returned her call and gave verbal order ok. YL,RMA

## 2019-12-12 NOTE — Patient Instructions (Addendum)
DRESSING CHANGES LEFT FOOT:  WEAR SURGICAL SHOE AT ALL TIMES    1. AFTER BATHING WITH ANTIBACTERIAL OR GENTLE SOAP (KEEP WOUND COVERED), DRY COMPLETELY WITH CLEAN GAUZE  2. CLEANSE ULCER WITH SALINE.  3. DAB DRY WITH GAUZE SPONGE.  4. APPLY A LIGHT AMOUNT OF IODOSORB OINTMENT TO BASE OF ULCER.  5. APPLY OUTER DRESSING AS INSTRUCTED.  6. WEAR SURGICAL SHOE DAILY AT ALL TIMES.  7. DO NOT WALK BAREFOOT!!!  8.  IF YOU EXPERIENCE ANY FEVER, CHILLS, NIGHTSWEATS, NAUSEA OR VOMITING, ELEVATED OR LOW BLOOD SUGARS, REPORT TO EMERGENCY ROOM.  9. IF YOU EXPERIENCE INCREASED REDNESS, PAIN, SWELLING, DISCOLORATION, ODOR, PUS, DRAINAGE OR WARMTH OF YOUR FOOT, REPORT TO EMERGENCY ROOM.    Diabetes Mellitus and Foot Care Foot care is an important part of your health, especially when you have diabetes. Diabetes may cause you to have problems because of poor blood flow (circulation) to your feet and legs, which can cause your skin to:  Become thinner and drier.  Break more easily.  Heal more slowly.  Peel and crack. You may also have nerve damage (neuropathy) in your legs and feet, causing decreased feeling in them. This means that you may not notice minor injuries to your feet that could lead to more serious problems. Noticing and addressing any potential problems early is the best way to prevent future foot problems. How to care for your feet Foot hygiene  Wash your feet daily with warm water and mild soap. Do not use hot water. Then, pat your feet and the areas between your toes until they are completely dry. Do not soak your feet as this can dry your skin.  Trim your toenails straight across. Do not dig under them or around the cuticle. File the edges of your nails with an emery board or nail file.  Apply a moisturizing lotion or petroleum jelly to the skin on your feet and to dry, brittle toenails. Use lotion that does not contain alcohol and is unscented. Do not apply lotion between your  toes. Shoes and socks  Wear clean socks or stockings every day. Make sure they are not too tight. Do not wear knee-high stockings since they may decrease blood flow to your legs.  Wear shoes that fit properly and have enough cushioning. Always look in your shoes before you put them on to be sure there are no objects inside.  To break in new shoes, wear them for just a few hours a day. This prevents injuries on your feet. Wounds, scrapes, corns, and calluses  Check your feet daily for blisters, cuts, bruises, sores, and redness. If you cannot see the bottom of your feet, use a mirror or ask someone for help.  Do not cut corns or calluses or try to remove them with medicine.  If you find a minor scrape, cut, or break in the skin on your feet, keep it and the skin around it clean and dry. You may clean these areas with mild soap and water. Do not clean the area with peroxide, alcohol, or iodine.  If you have a wound, scrape, corn, or callus on your foot, look at it several times a day to make sure it is healing and not infected. Check for: ? Redness, swelling, or pain. ? Fluid or blood. ? Warmth. ? Pus or a bad smell. General instructions  Do not cross your legs. This may decrease blood flow to your feet.  Do not use heating pads or hot water  bottles on your feet. They may burn your skin. If you have lost feeling in your feet or legs, you may not know this is happening until it is too late.  Protect your feet from hot and cold by wearing shoes, such as at the beach or on hot pavement.  Schedule a complete foot exam at least once a year (annually) or more often if you have foot problems. If you have foot problems, report any cuts, sores, or bruises to your health care provider immediately. Contact a health care provider if:  You have a medical condition that increases your risk of infection and you have any cuts, sores, or bruises on your feet.  You have an injury that is not  healing.  You have redness on your legs or feet.  You feel burning or tingling in your legs or feet.  You have pain or cramps in your legs and feet.  Your legs or feet are numb.  Your feet always feel cold.  You have pain around a toenail. Get help right away if:  You have a wound, scrape, corn, or callus on your foot and: ? You have pain, swelling, or redness that gets worse. ? You have fluid or blood coming from the wound, scrape, corn, or callus. ? Your wound, scrape, corn, or callus feels warm to the touch. ? You have pus or a bad smell coming from the wound, scrape, corn, or callus. ? You have a fever. ? You have a red line going up your leg. Summary  Check your feet every day for cuts, sores, red spots, swelling, and blisters.  Moisturize feet and legs daily.  Wear shoes that fit properly and have enough cushioning.  If you have foot problems, report any cuts, sores, or bruises to your health care provider immediately.  Schedule a complete foot exam at least once a year (annually) or more often if you have foot problems. This information is not intended to replace advice given to you by your health care provider. Make sure you discuss any questions you have with your health care provider. Document Revised: 02/20/2019 Document Reviewed: 07/01/2016 Elsevier Patient Education  [place].

## 2019-12-12 NOTE — Progress Notes (Signed)
  Subjective:  Patient ID: Micheal Walls, male    DOB: 1936-04-14,  MRN: [id number]  Chief Complaint  Patient presents with  . Nail Problem    LFT GREAT TOE OOZING, SWEELING //DIABETIC// GALAWAY PT     84 y.o. male presents with the above complaint. History confirmed with patient.  He is here today with his daughter.  At the last visit he had a blood blister from his shoe rubbing which had healed.  He noticed worsening drainage from a new ulceration over the last couple weeks.  He has a history of a below-knee amputation on the right side  Objective:  Physical Exam: warm, good capillary refill, DP reduced left, PT reduced left, reduced sensation at distal toe pulps and soles of feet and +3 pitting edema left lower extremity. Left Foot: 8 mm x 6 mm x 2 mm full-thickness ulceration over the dorsal medial distal phalanx left hallux.  Fibrotic base.  No exposed fascia, tendon, bone no purulence, no cellulitis, no malodor      Assessment:   1. Skin ulcer of second toe of left foot with fat layer exposed ([place])   2. Type II diabetes mellitus with peripheral circulatory disorder (HCC)   3. Diabetic peripheral neuropathy associated with type 2 diabetes mellitus ([place])   4. Status post below-knee amputation of right lower extremity ([place])      Plan:  Patient was evaluated and treated and all questions answered.  Neuropathic ulceration left hallux -Dressing applied consisting of  -Offload ulcer with surgical shoe -Surgical shoe dispensed -Wound cleansed and debrided -Follow-up with me in 1 week -Dressing instructions provided.  They will apply Iodosorb and a dry gauze dressing.  Okay to bathe with wound covered and then apply clean bandage after.  Procedure: Excisional Debridement of Wound Indication: Removal of non-viable soft tissue from the wound to promote healing.  Anesthesia: none Pre-Debridement Wound Measurements: 0.6 cm x 0.5 cm x 0.1 cm  Post-Debridement Wound Measurements:  0.8 cm x 0.6 cm x 0.2 cm  Type of Debridement: Sharp Excisional Tissue Removed: Non-viable soft tissue Instrumentation: 15 blade and tissue nipper Depth of Debridement: subcutaneous tissue. Technique: Sharp excisional debridement to bleeding, viable wound base.  Dressing: Dry, sterile, compression dressing. Disposition: Patient tolerated procedure well. Patient to return in 1 week for follow-up.    Return in about 1 week (around 12/19/2019) for diabetic ulcer left.

## 2019-12-17 ENCOUNTER — Ambulatory Visit
Admission: RE | Admit: 2019-12-17 | Discharge: 2019-12-17 | Disposition: A | Discharge status: Home / Self Care | Payer: Medicare Other | Source: Ambulatory Visit | Attending: Nurse Practitioner | Admitting: Nurse Practitioner

## 2019-12-17 ENCOUNTER — Other Ambulatory Visit: Payer: Self-pay

## 2019-12-17 DIAGNOSIS — R918 Other nonspecific abnormal finding of lung field: Secondary | ICD-10-CM | POA: Diagnosis not present

## 2019-12-17 DIAGNOSIS — M47814 Spondylosis without myelopathy or radiculopathy, thoracic region: Secondary | ICD-10-CM | POA: Diagnosis not present

## 2019-12-17 DIAGNOSIS — M419 Scoliosis, unspecified: Secondary | ICD-10-CM | POA: Diagnosis not present

## 2019-12-17 DIAGNOSIS — J690 Pneumonitis due to inhalation of food and vomit: Secondary | ICD-10-CM

## 2019-12-17 IMAGING — CR DG CHEST 2V
2 series · 2 of 2 positions shown · non-contrast
Comparison: [DATE].

CLINICAL DATA: Pneumonia follow-up.

EXAM:
CHEST - 2 VIEW

[w chest lat]
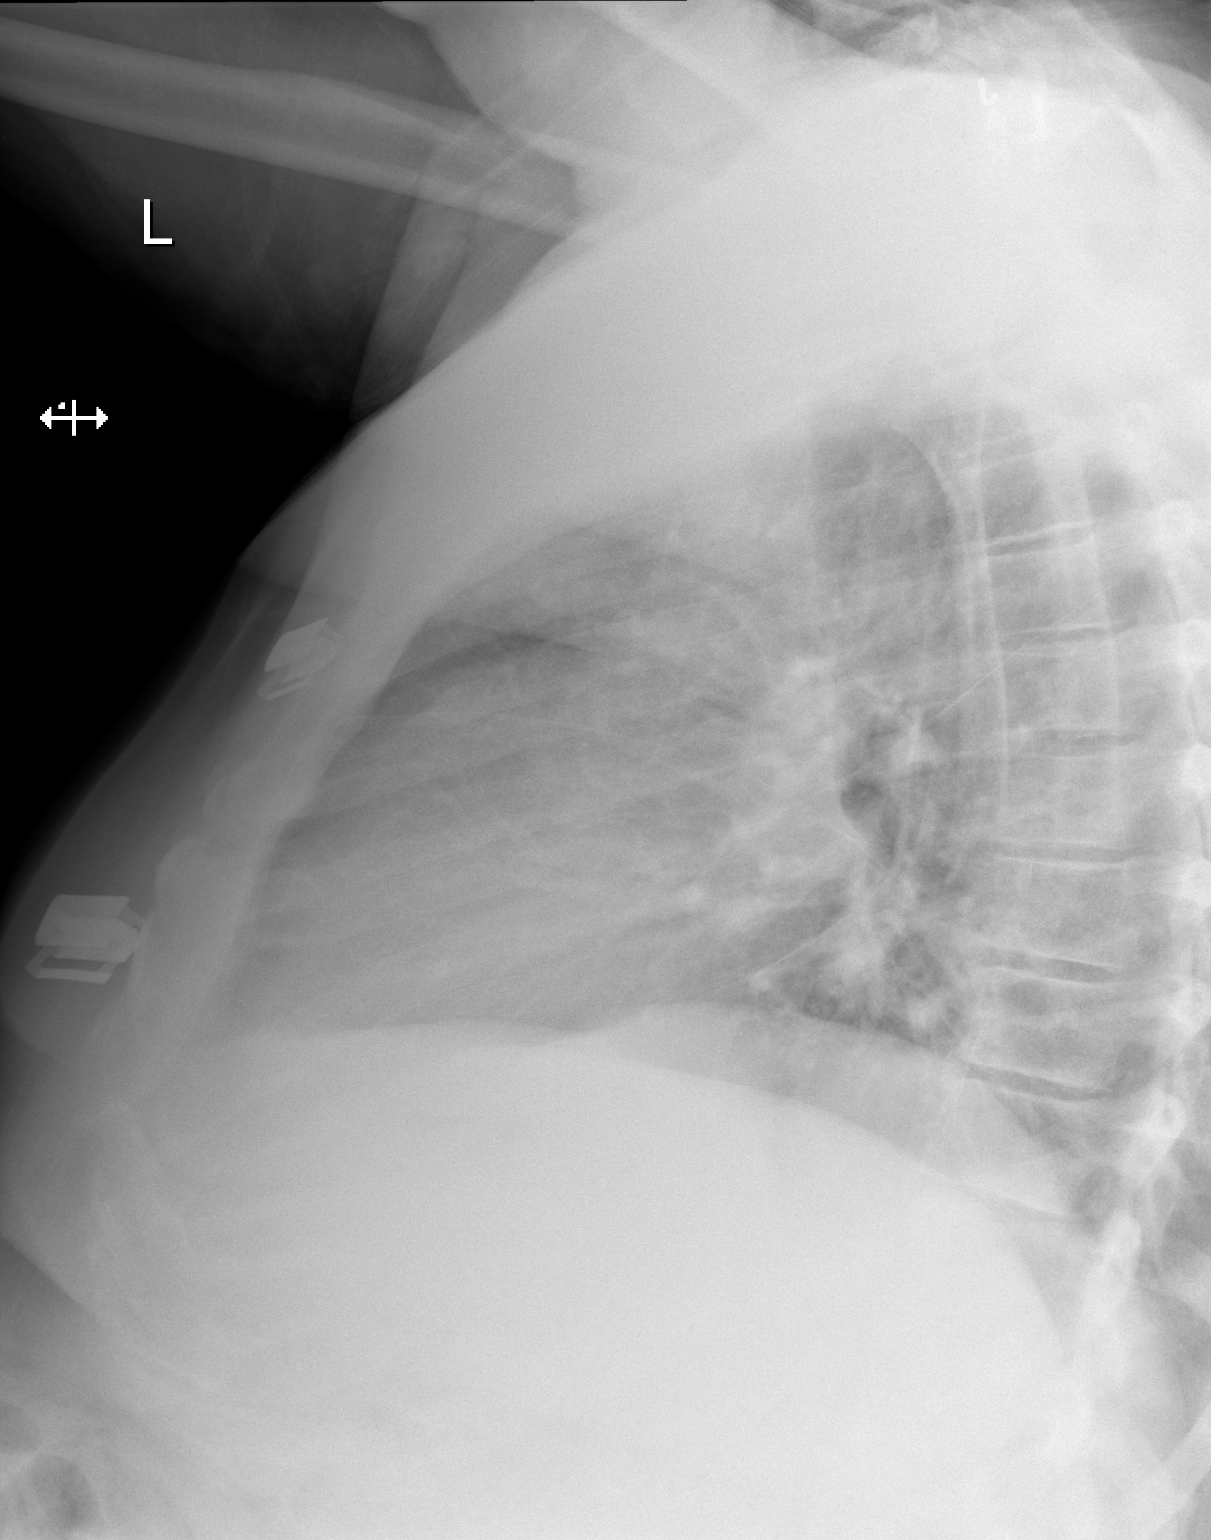

[x chest ap]
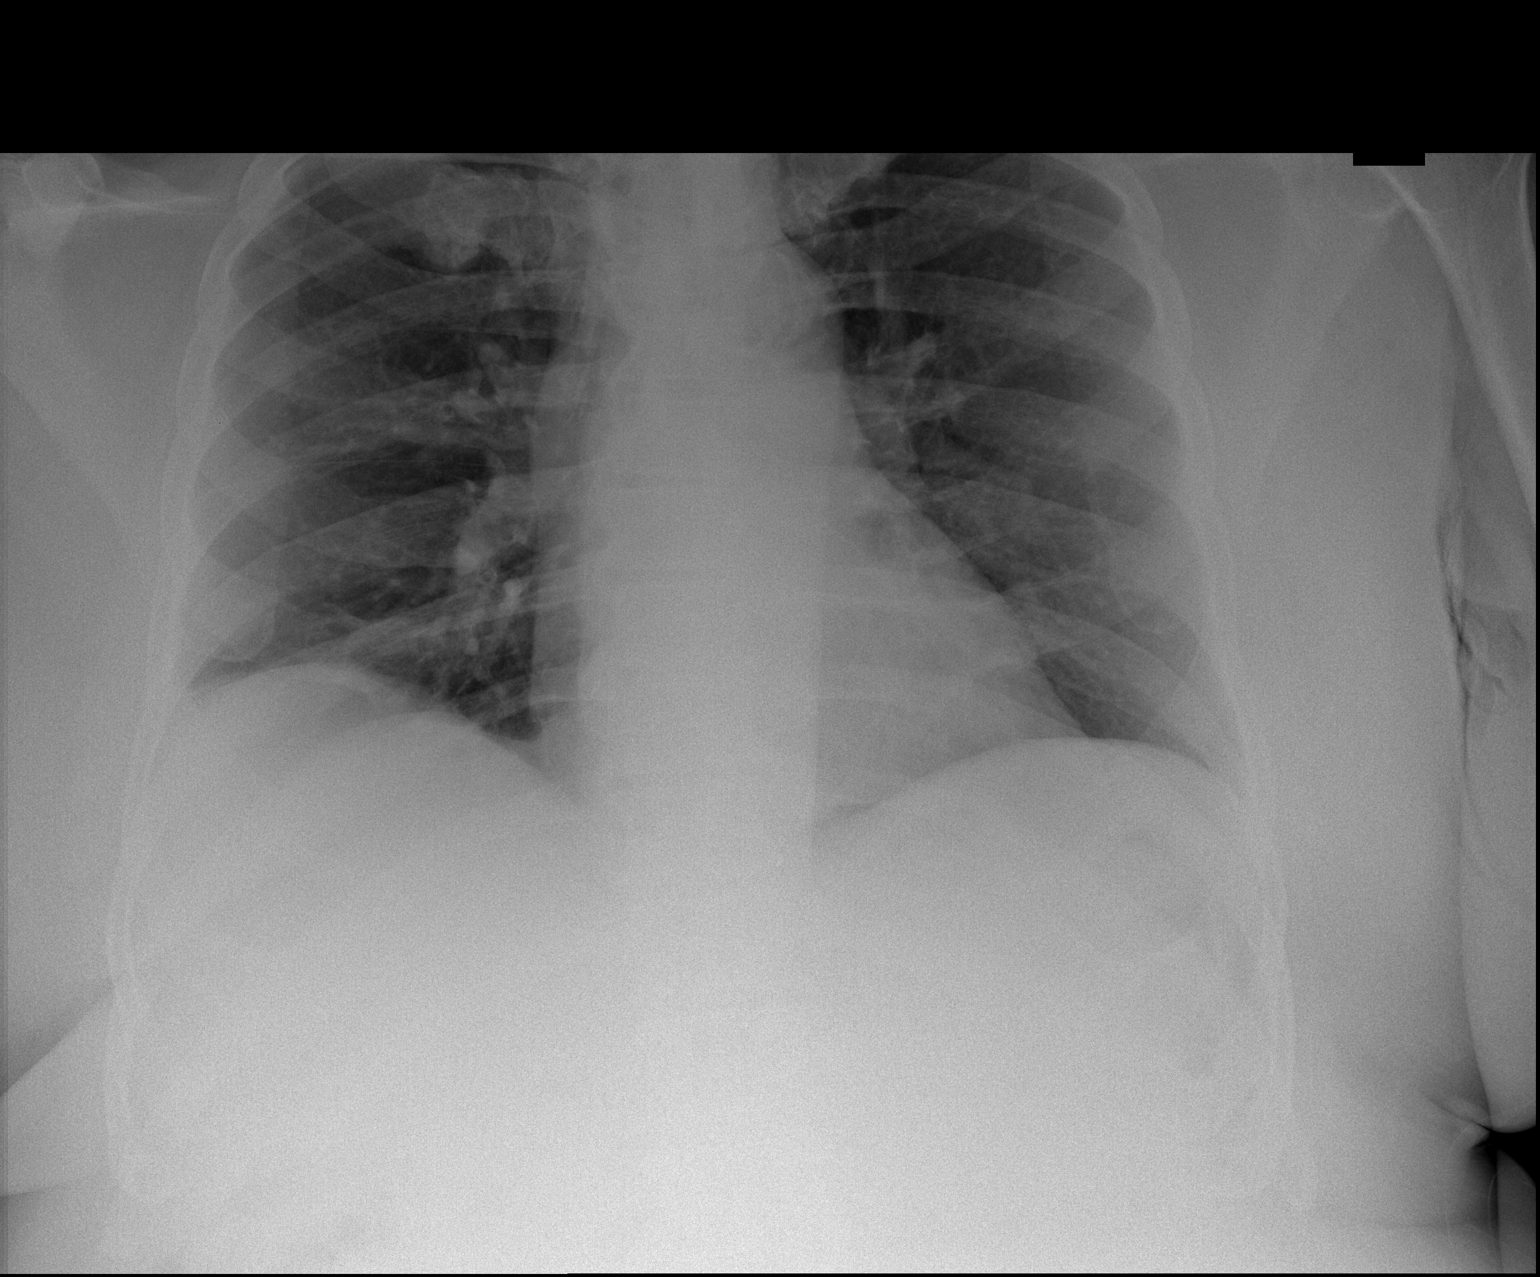

[2 of 2 positions shown; findings below may reference images not displayed]

FINDINGS: Mediastinum and hilar structures normal. Heart size normal. Low lung
volumes. No focal infiltrate. No pleural effusion or pneumothorax.
Degenerative changes scoliosis thoracic spine.
IMPRESSION: No acute cardiopulmonary disease.

## 2019-12-19 DIAGNOSIS — Z89519 Acquired absence of unspecified leg below knee: Secondary | ICD-10-CM | POA: Diagnosis not present

## 2019-12-19 DIAGNOSIS — L97522 Non-pressure chronic ulcer of other part of left foot with fat layer exposed: Secondary | ICD-10-CM | POA: Diagnosis not present

## 2019-12-19 DIAGNOSIS — E1159 Type 2 diabetes mellitus with other circulatory complications: Secondary | ICD-10-CM | POA: Diagnosis not present

## 2019-12-19 DIAGNOSIS — E1042 Type 1 diabetes mellitus with diabetic polyneuropathy: Secondary | ICD-10-CM | POA: Diagnosis not present

## 2019-12-20 ENCOUNTER — Other Ambulatory Visit: Payer: Self-pay

## 2019-12-20 ENCOUNTER — Ambulatory Visit: Payer: Medicare Other | Admitting: Podiatry

## 2019-12-26 ENCOUNTER — Ambulatory Visit: Payer: Medicare Other | Admitting: Nurse Practitioner

## 2019-12-26 ENCOUNTER — Telehealth: Payer: Self-pay

## 2019-12-26 NOTE — Telephone Encounter (Signed)
Micheal Walls called from [place] called regarding pt. His prosthetic leg socket is too big given he has lost 30-40lbs these last few months. They called hangers and they told him that pt needs a note with documentation from a office visit so a new socket can be made for him. Patient doesn't have a tight fit or stability. He wants to know if the pt needs to come in for a visit. [place]

## 2019-12-27 ENCOUNTER — Other Ambulatory Visit: Payer: Self-pay

## 2019-12-27 ENCOUNTER — Ambulatory Visit (INDEPENDENT_AMBULATORY_CARE_PROVIDER_SITE_OTHER): Payer: Medicare Other | Admitting: Podiatry

## 2019-12-27 ENCOUNTER — Encounter: Payer: Self-pay | Admitting: Podiatry

## 2019-12-27 DIAGNOSIS — Z89511 Acquired absence of right leg below knee: Secondary | ICD-10-CM | POA: Diagnosis not present

## 2019-12-27 DIAGNOSIS — L97522 Non-pressure chronic ulcer of other part of left foot with fat layer exposed: Secondary | ICD-10-CM | POA: Diagnosis not present

## 2019-12-27 DIAGNOSIS — B353 Tinea pedis: Secondary | ICD-10-CM | POA: Diagnosis not present

## 2019-12-27 DIAGNOSIS — B351 Tinea unguium: Secondary | ICD-10-CM | POA: Diagnosis not present

## 2019-12-27 DIAGNOSIS — E1151 Type 2 diabetes mellitus with diabetic peripheral angiopathy without gangrene: Secondary | ICD-10-CM

## 2019-12-27 NOTE — Progress Notes (Signed)
  Subjective:  Patient ID: Micheal Walls, male    DOB: April 12, 1936,  MRN: [id number]  Chief Complaint  Patient presents with  . Foot Ulcer    Pt states wound is doing a bit better, has been applying iodosorb. Denies fever/chills/nausea/vomiting.    84 y.o. male presents with the above complaint. History confirmed with patient.  Was unable to see him last week as his transportation company had to take him back home early.  States he has been doing well, his daughter has returned to her home and he is taking care of the wound himself.  Denies fevers, chills, nausea, vomiting.  Objective:  Physical Exam: warm, good capillary refill, normal DP and PT pulses and ulceration at left hallux as noted below.  +3 pitting edema throughout the left lower extremity. Left Foot: Hallux ulceration dorsal medial 5 mm x 5 mm x 4 mm.  Surrounding hyperkeratosis and fibrotic base.  No signs of infection Right Foot: Previous BKA     Assessment:   1. Skin ulcer of second toe of left foot with fat layer exposed ([place])   2. Type II diabetes mellitus with peripheral circulatory disorder (HCC)   3. Status post below-knee amputation of right lower extremity ([place])   4. Onychomycosis   5. Tinea pedis of left foot      Plan:  Patient was evaluated and treated and all questions answered.  -Reviewed treatment options for edema again today including elevating the legs while seated at home  -Tinea pedis stable  -His onychomycosis is stable and he will be returning in 2 weeks with Dr. Adah Perl for debridement.  I will examine his wound at that time as well.  He can then see me again in 2 weeks for further wound care  -Dressing applied consisting of prisma -Wound cleansed and debrided Procedure: Selective Debridement of Wound Rationale: Removal of devitalized tissue from the wound to promote healing.  Pre-Debridement Wound Measurements: 0.5 cm x 0.5 cm x 0.4 cm  Post-Debridement Wound Measurements: same as  pre-debridement. Type of Debridement: sharp selective Tissue Removed: Devitalized soft-tissue Dressing: Dry, sterile, compression dressing. Disposition: Patient tolerated procedure well. Patient to return in 1 week for follow-up.   No follow-ups on file.

## 2020-01-02 DIAGNOSIS — E1042 Type 1 diabetes mellitus with diabetic polyneuropathy: Secondary | ICD-10-CM | POA: Diagnosis not present

## 2020-01-02 DIAGNOSIS — E1159 Type 2 diabetes mellitus with other circulatory complications: Secondary | ICD-10-CM | POA: Diagnosis not present

## 2020-01-02 DIAGNOSIS — Z79899 Other long term (current) drug therapy: Secondary | ICD-10-CM | POA: Diagnosis not present

## 2020-01-06 ENCOUNTER — Ambulatory Visit (INDEPENDENT_AMBULATORY_CARE_PROVIDER_SITE_OTHER): Payer: Medicare Other | Admitting: Ophthalmology

## 2020-01-06 ENCOUNTER — Other Ambulatory Visit: Payer: Self-pay

## 2020-01-06 ENCOUNTER — Encounter (INDEPENDENT_AMBULATORY_CARE_PROVIDER_SITE_OTHER): Payer: Self-pay | Admitting: Ophthalmology

## 2020-01-06 DIAGNOSIS — E113553 Type 2 diabetes mellitus with stable proliferative diabetic retinopathy, bilateral: Secondary | ICD-10-CM | POA: Insufficient documentation

## 2020-01-06 DIAGNOSIS — H353132 Nonexudative age-related macular degeneration, bilateral, intermediate dry stage: Secondary | ICD-10-CM | POA: Diagnosis not present

## 2020-01-06 DIAGNOSIS — H35361 Drusen (degenerative) of macula, right eye: Secondary | ICD-10-CM

## 2020-01-06 DIAGNOSIS — H35372 Puckering of macula, left eye: Secondary | ICD-10-CM

## 2020-01-06 DIAGNOSIS — E113593 Type 2 diabetes mellitus with proliferative diabetic retinopathy without macular edema, bilateral: Secondary | ICD-10-CM | POA: Diagnosis not present

## 2020-01-06 DIAGNOSIS — E113591 Type 2 diabetes mellitus with proliferative diabetic retinopathy without macular edema, right eye: Secondary | ICD-10-CM | POA: Insufficient documentation

## 2020-01-06 NOTE — Progress Notes (Signed)
01/06/2020     CHIEF COMPLAINT Patient presents for Retina Follow Up   HISTORY OF PRESENT ILLNESS: Micheal Walls is a 84 y.o. male who presents to the clinic today for:   HPI    Retina Follow Up    Patient presents with  Diabetic Retinopathy.  In both eyes.  Severity is moderate.  Duration of 8 months.  Since onset it is stable.  I, the attending physician,  performed the HPI with the patient and updated documentation appropriately.          Comments    8 Month Daibetic f\u OU. OCT  Pt states no changes in vision. Using gtts as directed. BGL: 118 A1C: 6.9       Last edited by Tilda Franco on 01/06/2020  9:13 AM. (History)      Referring physician: [name], [place] [address] [place],  [place] [address]  HISTORICAL INFORMATION:   Selected notes from the MEDICAL RECORD NUMBER    Lab Results  Component Value Date   HGBA1C 6.9 (H) 09/24/2019     CURRENT MEDICATIONS: Current Outpatient Medications (Ophthalmic Drugs)  Medication Sig  . dorzolamide (TRUSOPT) 2 % ophthalmic solution Place 1 drop into both eyes 2 (two) times daily.  Marland Kitchen ketotifen (ALAWAY) 0.025 % ophthalmic solution Place 1 drop into both eyes 2 (two) times daily.  Marland Kitchen latanoprost (XALATAN) 0.005 % ophthalmic solution Place 1 drop into both eyes at bedtime.   . Polyvinyl Alcohol-Povidone (REFRESH OP) Apply 1 drop to eye 2 (two) times daily.   No current facility-administered medications for this visit. (Ophthalmic Drugs)   Current Outpatient Medications (Other)  Medication Sig  . allopurinol (ZYLOPRIM) 100 MG tablet Take 1 tablet (100 mg total) by mouth daily. (Patient taking differently: Take 100 mg by mouth 2 (two) times daily. )  . antiseptic oral rinse (BIOTENE) LIQD 15 mLs by Mouth Rinse route as needed for dry mouth.  Marland Kitchen aspirin 81 MG tablet Take 162 mg by mouth daily.  [name] Use a cotton tip applicator. Paint between toes of left foot as  needed once daily.  . colchicine 0.6 MG tablet Take 0.6 mg by mouth as needed (gout flare up).   . diltiazem (CARDIZEM CD) 300 MG 24 hr capsule TAKE ONE CAPSULE EACH DAY (Patient taking differently: Take 300 mg by mouth daily. )  . ferrous sulfate 325 (65 FE) MG tablet Take 325 mg by mouth daily with breakfast.  . fish oil-omega-3 fatty acids 1000 MG capsule Take 2 g by mouth daily.  . furosemide (LASIX) 40 MG tablet Take 1 tablet (40 mg total) by mouth daily.  Marland Kitchen gabapentin (NEURONTIN) 100 MG capsule Take 1 capsule (100 mg total) by mouth 3 (three) times daily.  Marland Kitchen glucose blood (FREESTYLE PRECISION NEO TEST) test strip Use as instructed  . hydrocortisone (ANUSOL-HC) 25 MG suppository Place 25 mg rectally 2 (two) times daily as needed for hemorrhoids or anal itching.  . Multiple Vitamin (MULTIVITAMIN) tablet Take 1 tablet by mouth daily.  . niacin (NIASPAN) 500 MG CR tablet TAKE 2 TABLETS AT BEDTIME  . pantoprazole (PROTONIX) 40 MG tablet Take 1 tablet (40 mg total) by mouth daily.  . pramoxine-hydrocortisone (PRAMOSONE) cream Apply topically 3 (three) times daily. Apply topically to hemorrhoids four times daily as needed after bowel movements, for external use only  . rosuvastatin (CRESTOR) 10 MG tablet Take 1 tablet (10 mg total) by mouth daily.  Marland Kitchen  Semaglutide, [name], (OZEMPIC, [name],) 2 MG/1.5ML SOPN Inject 1 mg into the skin once a week.   No current facility-administered medications for this visit. (Other)      REVIEW OF SYSTEMS:    ALLERGIES No Known Allergies  PAST MEDICAL HISTORY Past Medical History:  Diagnosis Date  . 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 Anemia   . Diastolic heart failure ([place])   . Diverticulosis   . DM (diabetes mellitus) ([place])   . Gout   . Hyperlipemia   . OSA on CPAP   . RBBB   . Systemic hypertension    Past Surgical History:  Procedure Laterality Date  . LEG AMPUTATION BELOW KNEE     right  . NM MYOCAR PERF WALL MOTION  09/18/2009     no ischemia    FAMILY HISTORY Family History  Problem Relation Age of Onset  . Diabetes Mother   . Heart attack Father   . Cancer Sister     SOCIAL HISTORY Social History   Tobacco Use  . Smoking status: Former Smoker    Types: Cigars    Quit date: 06/12/2001    Years since quitting: 18.5  . Smokeless tobacco: Never Used  Vaping Use  . Vaping Use: Former  Substance Use Topics  . Alcohol use: No    Alcohol/week: 0.0 standard drinks  . Drug use: No         OPHTHALMIC EXAM:  Base Eye Exam    Visual Acuity (Snellen - Linear)      Right Left   Dist cc 20/40 -2 20/80   Dist ph cc 20/30 20/60       Tonometry (Tonopen, 9:18 AM)      Right Left   Pressure 19 20       Pupils      Pupils Dark Light Shape React APD   Right PERRL 2.5 2 Round Brisk None   Left PERRL 2.5 2 Round Brisk None       Visual Fields (Counting fingers)      Left Right    Full Full       Neuro/Psych    Oriented x3: Yes   Mood/Affect: Normal       Dilation    Both eyes: 1.0% Mydriacyl, 2.5% Phenylephrine @ 9:18 AM        Slit Lamp and Fundus Exam    External Exam      Right Left   External Normal Normal       Slit Lamp Exam      Right Left   Lens Centered posterior chamber intraocular lens Centered posterior chamber intraocular lens          IMAGING AND PROCEDURES  Imaging and Procedures for 01/06/20  OCT, Retina - OU - Both Eyes       Right Eye Quality was good. Scan locations included subfoveal. Central Foveal Thickness: 206. Progression has been stable. Findings include abnormal foveal contour, central retinal atrophy, outer retinal atrophy.   Left Eye Quality was good. Scan locations included subfoveal. Central Foveal Thickness: 212. Progression has been stable. Findings include abnormal foveal contour, central retinal atrophy, outer retinal atrophy.   Notes No active CSME OU                ASSESSMENT/PLAN:  Controlled type 2 diabetes mellitus  with stable proliferative retinopathy of both eyes, with long-term current use of insulin (HCC) The nature of regressed proliferative diabetic retinopathy  was discussed with the patient. The patient was advised to maintain good glucose, blood pressure, monitor kidney function and serum lipid control as advised by personal physician. Rare risk for reactivation of progression exist with untreated severe anemia, untreated renal failure, untreated heart failure, and smoking. Complete avoidance of smoking was recommended. The chance of recurrent proliferative diabetic retinopathy was discussed as well as the chance of vitreous hemorrhage for which further treatments may be necessary.   Explained to the patient that the quiescent  proliferative diabetic retinopathy disease is unlikely to ever worsen.  Worsening factors would include however severe anemia, hypertension out-of-control or impending renal failure.      ICD-10-CM   1. Controlled type 2 diabetes mellitus with both eyes affected by proliferative retinopathy without macular edema, unspecified whether long term insulin use (HCC)  E11.3593 OCT, Retina - OU - Both Eyes  2. Intermediate stage nonexudative age-related macular degeneration of both eyes  H35.3132 OCT, Retina - OU - Both Eyes  3. Left epiretinal membrane  H35.372   4. [place] of right macula  H35.361     1.  Previous PRP OU has stabilized ocular condition.  No active maculopathy.  Acuity stable.  2.  3.  Ophthalmic Meds Ordered this visit:  No orders of the defined types were placed in this encounter.      Return in about 8 months (around 09/05/2020) for DILATE OU, COLOR FP.  There are no Patient Instructions on file for this visit.   Explained the diagnoses, plan, and follow up with the patient and they expressed understanding.  Patient expressed understanding of the importance of proper follow up care.   Clent Demark Aleta Manternach M.D. Diseases & Surgery of the Retina and  Vitreous Retina & Diabetic [place] 01/06/20     Abbreviations: M myopia (nearsighted); A astigmatism; H hyperopia (farsighted); P presbyopia; Mrx spectacle prescription;  CTL contact lenses; OD right eye; OS left eye; OU both eyes  XT exotropia; ET esotropia; PEK punctate epithelial keratitis; PEE punctate epithelial erosions; DES dry eye syndrome; MGD meibomian gland dysfunction; ATs artificial tears; PFAT's preservative free artificial tears; [place] nuclear sclerotic cataract; PSC posterior subcapsular cataract; ERM epi-retinal membrane; PVD posterior vitreous detachment; RD retinal detachment; DM diabetes mellitus; DR diabetic retinopathy; NPDR non-proliferative diabetic retinopathy; PDR proliferative diabetic retinopathy; CSME clinically significant macular edema; DME diabetic macular edema; dbh dot blot hemorrhages; CWS cotton wool spot; POAG primary open angle glaucoma; C/D cup-to-disc ratio; HVF humphrey visual field; GVF goldmann visual field; OCT optical coherence tomography; IOP intraocular pressure; BRVO Branch retinal vein occlusion; CRVO central retinal vein occlusion; CRAO central retinal artery occlusion; BRAO branch retinal artery occlusion; RT retinal tear; SB scleral buckle; PPV pars plana vitrectomy; VH Vitreous hemorrhage; PRP panretinal laser photocoagulation; IVK intravitreal kenalog; VMT vitreomacular traction; MH Macular hole;  NVD neovascularization of the disc; NVE neovascularization elsewhere; AREDS age related eye disease study; ARMD age related macular degeneration; POAG primary open angle glaucoma; EBMD epithelial/anterior basement membrane dystrophy; ACIOL anterior chamber intraocular lens; IOL intraocular lens; PCIOL posterior chamber intraocular lens; Phaco/IOL phacoemulsification with intraocular lens placement; [place] photorefractive keratectomy; LASIK laser assisted in situ keratomileusis; HTN hypertension; DM diabetes mellitus; COPD chronic obstructive pulmonary disease

## 2020-01-06 NOTE — Assessment & Plan Note (Signed)

## 2020-01-08 ENCOUNTER — Ambulatory Visit: Payer: Medicare Other | Admitting: Cardiovascular Disease

## 2020-01-08 DIAGNOSIS — H35372 Puckering of macula, left eye: Secondary | ICD-10-CM | POA: Diagnosis not present

## 2020-01-08 DIAGNOSIS — E103293 Type 1 diabetes mellitus with mild nonproliferative diabetic retinopathy without macular edema, bilateral: Secondary | ICD-10-CM | POA: Diagnosis not present

## 2020-01-08 DIAGNOSIS — H31013 Macula scars of posterior pole (postinflammatory) (post-traumatic), bilateral: Secondary | ICD-10-CM | POA: Diagnosis not present

## 2020-01-08 DIAGNOSIS — H401132 Primary open-angle glaucoma, bilateral, moderate stage: Secondary | ICD-10-CM | POA: Diagnosis not present

## 2020-01-08 DIAGNOSIS — E119 Type 2 diabetes mellitus without complications: Secondary | ICD-10-CM | POA: Diagnosis not present

## 2020-01-08 DIAGNOSIS — R208 Other disturbances of skin sensation: Secondary | ICD-10-CM | POA: Diagnosis not present

## 2020-01-08 LAB — HM DIABETES EYE EXAM

## 2020-01-09 ENCOUNTER — Encounter: Payer: Self-pay | Admitting: Nurse Practitioner

## 2020-01-09 ENCOUNTER — Telehealth: Payer: Self-pay | Admitting: *Deleted

## 2020-01-09 ENCOUNTER — Telehealth: Payer: Self-pay

## 2020-01-09 DIAGNOSIS — R1312 Dysphagia, oropharyngeal phase: Secondary | ICD-10-CM | POA: Diagnosis not present

## 2020-01-09 NOTE — Telephone Encounter (Signed)
Change daily with iodosorb and adhesive bandage. If you need an Epic order I can place one. Thanks!

## 2020-01-09 NOTE — Telephone Encounter (Signed)
Micheal Walls from [facility] called requesting verbal orders for 1 week 1, 2 week 2 for dysphagia speech therapy [phone]  I called her and was unable to leave a v/m due to it being full. YL,RMA

## 2020-01-09 NOTE — Telephone Encounter (Signed)
Allyne Gee requested wound care orders and last office note.

## 2020-01-10 NOTE — Telephone Encounter (Signed)
I spoke with Laureen Ochs and she recommended orders to be faxed to [phone]. Faxed Dr. Maxie Barb 01/09/2020 6:10pm orders to [facility].

## 2020-01-14 ENCOUNTER — Encounter: Payer: Self-pay | Admitting: Podiatry

## 2020-01-14 ENCOUNTER — Telehealth: Payer: Self-pay

## 2020-01-14 ENCOUNTER — Other Ambulatory Visit: Payer: Self-pay

## 2020-01-14 ENCOUNTER — Ambulatory Visit (INDEPENDENT_AMBULATORY_CARE_PROVIDER_SITE_OTHER): Payer: Medicare Other | Admitting: Podiatry

## 2020-01-14 ENCOUNTER — Other Ambulatory Visit: Payer: Self-pay | Admitting: Nurse Practitioner

## 2020-01-14 DIAGNOSIS — E1142 Type 2 diabetes mellitus with diabetic polyneuropathy: Secondary | ICD-10-CM | POA: Diagnosis not present

## 2020-01-14 DIAGNOSIS — G629 Polyneuropathy, unspecified: Secondary | ICD-10-CM

## 2020-01-14 DIAGNOSIS — I739 Peripheral vascular disease, unspecified: Secondary | ICD-10-CM

## 2020-01-14 DIAGNOSIS — Z89511 Acquired absence of right leg below knee: Secondary | ICD-10-CM

## 2020-01-14 DIAGNOSIS — B351 Tinea unguium: Secondary | ICD-10-CM | POA: Diagnosis not present

## 2020-01-14 NOTE — Telephone Encounter (Signed)
Patient notified that his kidney functions and A1C are both stable and he can continue his current meds per JM. YL,RMA

## 2020-01-14 NOTE — Progress Notes (Addendum)
Subjective: Micheal Walls presents today for follow up of at risk foot care. 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 Pain interferes with ambulation. Aggravating factors include wearing enclosed shoe gear. Pain is relieved with periodic professional debridement.   He is currently seeing Dr. Sherryle Lis for ulceration of left hallux.   No Known Allergies   Objective: There were no vitals filed for this visit.  Pt is a pleasant 84 y.o. year old AA male morbidly obese in NAD. AAO x 3.   Vascular Examination:  Capillary refill time to remaining digits <3 seconds. DP pulse nonpalpable left foot. PT pulse nonpalpable left foot. Pedal hair absent LLE. Skin temperature gradient WNL LLE. Unilateral edema left LE.  Dermatological Examination: Pedal skin with normal turgor, texture and tone bilaterally. No open wounds bilaterally. No interdigital macerations bilaterally. Toenails L 2nd toe, L 3rd toe, L 4th toe and L 5th toe elongated, dystrophic, thickened, and crumbly with subungual debris and tenderness to dorsal palpation. Healing blood blister noted at IPJ of left hallux. No surrounding erythema, no edema, no drainage, no flocculence. Left hallux nailplate adequate length.          Musculoskeletal: Normal muscle strength 5/5 to all lower extremity muscle groups of LLE. Hammertoes noted to the L hallux, L 2nd toe, L 3rd toe, L 4th toe and L 5th toe. Utilizes motorized chair for mobility assistance. BKA RLE.  Neurological: Protective sensation diminished with 10 gram monofilament left foot. Vibratory sensation diminished left foot.  Assessment: 1. Onychomycosis   2. Status post below knee amputation, right ([place])   3. Diabetic peripheral neuropathy associated with type 2 diabetes mellitus ([place])     Plan: -Continue diabetic foot care principles. -Toenails L 2nd toe, L 3rd toe, L 4th toe and L  5th toe debrided in length and girth without iatrogenic bleeding with sterile nail nipper and dremel.  -Patient currently seeing Dr. Sherryle Lis for left hallux ulceration. Will see Dr. Sherryle Lis today as well for follow-up of ulcer left hallux. -Patient to continue Darco shoe per Dr. Sherryle Lis. -Patient to report any pedal injuries to medical professional immediately. -Patient/POA to call should there be question/concern in the interim.  Return in about 3 months (around 04/15/2020) for 3 months for Dr. Elisha Ponder for nail care.  [name], DPM   Ulcer left hallux -Agree with Dr. Heber Arthur plan and assessment above -Debridement as below. -Dressed with Prisma, DSD. -Continue off-loading with surgical shoe.  Procedure: Excisional Debridement of Wound Rationale: Removal of non-viable soft tissue from the wound to promote healing.  Anesthesia: none Pre-Debridement Wound Measurements: 0.3 cm x 0.3 cm x 0.3 cm  Post-Debridement Wound Measurements: 0.3 cm x 0.3 cm x 0.3 cm  Type of Debridement: Sharp Excisional Tissue Removed: Non-viable soft tissue Depth of Debridement: subcutaneous tissue. Technique: Sharp excisional debridement to bleeding, viable wound base.  Dressing: Dry, sterile, compression dressing. Disposition: Patient tolerated procedure well. Patient to return in 2 weeks with myself for follow-up wound care.  [name], DPM 01/14/2020    Return in about 3 months (around 04/15/2020) for 3 months for Dr. Elisha Ponder for nail care.

## 2020-01-16 ENCOUNTER — Encounter (HOSPITAL_COMMUNITY): Payer: Self-pay | Admitting: Emergency Medicine

## 2020-01-16 ENCOUNTER — Inpatient Hospital Stay (HOSPITAL_COMMUNITY)
Admission: EM | Admit: 2020-01-16 | Discharge: 2020-01-20 | DRG: 871 | Disposition: A | Discharge status: Home Health | Payer: Medicare Other | Attending: Family Medicine | Admitting: Family Medicine

## 2020-01-16 ENCOUNTER — Inpatient Hospital Stay (HOSPITAL_COMMUNITY): Payer: Medicare Other

## 2020-01-16 ENCOUNTER — Emergency Department (HOSPITAL_COMMUNITY): Payer: Medicare Other

## 2020-01-16 ENCOUNTER — Other Ambulatory Visit: Payer: Self-pay

## 2020-01-16 DIAGNOSIS — E1151 Type 2 diabetes mellitus with diabetic peripheral angiopathy without gangrene: Secondary | ICD-10-CM | POA: Diagnosis present

## 2020-01-16 DIAGNOSIS — M109 Gout, unspecified: Secondary | ICD-10-CM | POA: Diagnosis present

## 2020-01-16 DIAGNOSIS — L03116 Cellulitis of left lower limb: Secondary | ICD-10-CM | POA: Diagnosis present

## 2020-01-16 DIAGNOSIS — R6520 Severe sepsis without septic shock: Secondary | ICD-10-CM | POA: Diagnosis present

## 2020-01-16 DIAGNOSIS — G9341 Metabolic encephalopathy: Secondary | ICD-10-CM

## 2020-01-16 DIAGNOSIS — Z87891 Personal history of nicotine dependence: Secondary | ICD-10-CM | POA: Diagnosis not present

## 2020-01-16 DIAGNOSIS — G4733 Obstructive sleep apnea (adult) (pediatric): Secondary | ICD-10-CM | POA: Diagnosis present

## 2020-01-16 DIAGNOSIS — G9340 Encephalopathy, unspecified: Secondary | ICD-10-CM

## 2020-01-16 DIAGNOSIS — E11621 Type 2 diabetes mellitus with foot ulcer: Secondary | ICD-10-CM | POA: Diagnosis present

## 2020-01-16 DIAGNOSIS — R6510 Systemic inflammatory response syndrome (SIRS) of non-infectious origin without acute organ dysfunction: Secondary | ICD-10-CM | POA: Diagnosis present

## 2020-01-16 DIAGNOSIS — E1122 Type 2 diabetes mellitus with diabetic chronic kidney disease: Secondary | ICD-10-CM | POA: Diagnosis present

## 2020-01-16 DIAGNOSIS — R4182 Altered mental status, unspecified: Secondary | ICD-10-CM | POA: Diagnosis not present

## 2020-01-16 DIAGNOSIS — A419 Sepsis, unspecified organism: Secondary | ICD-10-CM | POA: Diagnosis present

## 2020-01-16 DIAGNOSIS — E1165 Type 2 diabetes mellitus with hyperglycemia: Secondary | ICD-10-CM | POA: Diagnosis not present

## 2020-01-16 DIAGNOSIS — I213 ST elevation (STEMI) myocardial infarction of unspecified site: Secondary | ICD-10-CM | POA: Diagnosis not present

## 2020-01-16 DIAGNOSIS — Z8249 Family history of ischemic heart disease and other diseases of the circulatory system: Secondary | ICD-10-CM | POA: Diagnosis not present

## 2020-01-16 DIAGNOSIS — Z79899 Other long term (current) drug therapy: Secondary | ICD-10-CM

## 2020-01-16 DIAGNOSIS — I10 Essential (primary) hypertension: Secondary | ICD-10-CM | POA: Diagnosis present

## 2020-01-16 DIAGNOSIS — L97509 Non-pressure chronic ulcer of other part of unspecified foot with unspecified severity: Secondary | ICD-10-CM

## 2020-01-16 DIAGNOSIS — L97522 Non-pressure chronic ulcer of other part of left foot with fat layer exposed: Secondary | ICD-10-CM | POA: Diagnosis present

## 2020-01-16 DIAGNOSIS — I5032 Chronic diastolic (congestive) heart failure: Secondary | ICD-10-CM | POA: Diagnosis present

## 2020-01-16 DIAGNOSIS — A4152 Sepsis due to Pseudomonas: Principal | ICD-10-CM | POA: Diagnosis present

## 2020-01-16 DIAGNOSIS — E669 Obesity, unspecified: Secondary | ICD-10-CM | POA: Diagnosis present

## 2020-01-16 DIAGNOSIS — R404 Transient alteration of awareness: Secondary | ICD-10-CM | POA: Diagnosis not present

## 2020-01-16 DIAGNOSIS — Z89511 Acquired absence of right leg below knee: Secondary | ICD-10-CM | POA: Diagnosis not present

## 2020-01-16 DIAGNOSIS — Z7982 Long term (current) use of aspirin: Secondary | ICD-10-CM

## 2020-01-16 DIAGNOSIS — E785 Hyperlipidemia, unspecified: Secondary | ICD-10-CM | POA: Diagnosis present

## 2020-01-16 DIAGNOSIS — Z6836 Body mass index (BMI) 36.0-36.9, adult: Secondary | ICD-10-CM

## 2020-01-16 DIAGNOSIS — R0689 Other abnormalities of breathing: Secondary | ICD-10-CM | POA: Diagnosis not present

## 2020-01-16 DIAGNOSIS — N183 Chronic kidney disease, stage 3 unspecified: Secondary | ICD-10-CM | POA: Diagnosis present

## 2020-01-16 DIAGNOSIS — Z794 Long term (current) use of insulin: Secondary | ICD-10-CM

## 2020-01-16 DIAGNOSIS — Z833 Family history of diabetes mellitus: Secondary | ICD-10-CM

## 2020-01-16 DIAGNOSIS — R0902 Hypoxemia: Secondary | ICD-10-CM | POA: Diagnosis not present

## 2020-01-16 DIAGNOSIS — I130 Hypertensive heart and chronic kidney disease with heart failure and stage 1 through stage 4 chronic kidney disease, or unspecified chronic kidney disease: Secondary | ICD-10-CM | POA: Diagnosis present

## 2020-01-16 DIAGNOSIS — N184 Chronic kidney disease, stage 4 (severe): Secondary | ICD-10-CM | POA: Diagnosis present

## 2020-01-16 DIAGNOSIS — E113591 Type 2 diabetes mellitus with proliferative diabetic retinopathy without macular edema, right eye: Secondary | ICD-10-CM

## 2020-01-16 DIAGNOSIS — E113553 Type 2 diabetes mellitus with stable proliferative diabetic retinopathy, bilateral: Secondary | ICD-10-CM | POA: Diagnosis present

## 2020-01-16 DIAGNOSIS — M19072 Primary osteoarthritis, left ankle and foot: Secondary | ICD-10-CM | POA: Diagnosis not present

## 2020-01-16 DIAGNOSIS — Z20822 Contact with and (suspected) exposure to covid-19: Secondary | ICD-10-CM | POA: Diagnosis present

## 2020-01-16 DIAGNOSIS — R600 Localized edema: Secondary | ICD-10-CM | POA: Diagnosis not present

## 2020-01-16 DIAGNOSIS — L97529 Non-pressure chronic ulcer of other part of left foot with unspecified severity: Secondary | ICD-10-CM | POA: Diagnosis present

## 2020-01-16 DIAGNOSIS — E113593 Type 2 diabetes mellitus with proliferative diabetic retinopathy without macular edema, bilateral: Secondary | ICD-10-CM | POA: Diagnosis present

## 2020-01-16 HISTORY — DX: Metabolic encephalopathy: G93.41

## 2020-01-16 LAB — CBC WITH DIFFERENTIAL/PLATELET
Abs Immature Granulocytes: 0.08 10*3/uL — ABNORMAL HIGH (ref 0.00–0.07)
Basophils Absolute: 0 10*3/uL (ref 0.0–0.1)
Basophils Relative: 0 %
Eosinophils Absolute: 0 10*3/uL (ref 0.0–0.5)
Eosinophils Relative: 0 %
HCT: 37.9 % — ABNORMAL LOW (ref 39.0–52.0)
Hemoglobin: 12.2 g/dL — ABNORMAL LOW (ref 13.0–17.0)
Immature Granulocytes: 1 %
Lymphocytes Relative: 4 %
Lymphs Abs: 0.5 10*3/uL — ABNORMAL LOW (ref 0.7–4.0)
MCH: 31 pg (ref 26.0–34.0)
MCHC: 32.2 g/dL (ref 30.0–36.0)
MCV: 96.4 fL (ref 80.0–100.0)
Monocytes Absolute: 0.7 10*3/uL (ref 0.1–1.0)
Monocytes Relative: 6 %
Neutro Abs: 10.7 10*3/uL — ABNORMAL HIGH (ref 1.7–7.7)
Neutrophils Relative %: 89 %
Platelets: 163 10*3/uL (ref 150–400)
RBC: 3.93 MIL/uL — ABNORMAL LOW (ref 4.22–5.81)
RDW: 15.1 % (ref 11.5–15.5)
WBC: 12 10*3/uL — ABNORMAL HIGH (ref 4.0–10.5)
nRBC: 0 % (ref 0.0–0.2)

## 2020-01-16 LAB — PROTIME-INR
INR: 1.1 (ref 0.8–1.2)
Prothrombin Time: 14 seconds (ref 11.4–15.2)

## 2020-01-16 LAB — APTT: aPTT: 24 seconds (ref 24–36)

## 2020-01-16 LAB — COMPREHENSIVE METABOLIC PANEL
ALT: 23 U/L (ref 0–44)
AST: 29 U/L (ref 15–41)
Albumin: 3.7 g/dL (ref 3.5–5.0)
Alkaline Phosphatase: 66 U/L (ref 38–126)
Anion gap: 11 (ref 5–15)
BUN: 56 mg/dL — ABNORMAL HIGH (ref 8–23)
CO2: 22 mmol/L (ref 22–32)
Calcium: 10.4 mg/dL — ABNORMAL HIGH (ref 8.9–10.3)
Chloride: 102 mmol/L (ref 98–111)
Creatinine, Ser: 2.8 mg/dL — ABNORMAL HIGH (ref 0.61–1.24)
GFR calc Af Amer: 23 mL/min — ABNORMAL LOW (ref >60)
GFR calc non Af Amer: 20 mL/min — ABNORMAL LOW (ref >60)
Glucose, Bld: 239 mg/dL — ABNORMAL HIGH (ref 70–99)
Potassium: 4.2 mmol/L (ref 3.5–5.1)
Sodium: 135 mmol/L (ref 135–145)
Total Bilirubin: 0.8 mg/dL (ref 0.3–1.2)
Total Protein: 7 g/dL (ref 6.5–8.1)

## 2020-01-16 LAB — SARS CORONAVIRUS 2 BY RT PCR (HOSPITAL ORDER, PERFORMED IN ~~LOC~~ HOSPITAL LAB): SARS Coronavirus 2: NEGATIVE

## 2020-01-16 LAB — PROCALCITONIN: Procalcitonin: 7.73 ng/mL

## 2020-01-16 LAB — CBG MONITORING, ED: Glucose-Capillary: 202 mg/dL — ABNORMAL HIGH (ref 70–99)

## 2020-01-16 LAB — LACTIC ACID, PLASMA: Lactic Acid, Venous: 1.9 mmol/L (ref 0.5–1.9)

## 2020-01-16 IMAGING — CR DG FOOT 2V*L*
2 series · 2 of 2 positions shown · non-contrast
Comparison: Foot radiograph [DATE]

CLINICAL DATA: Left foot swelling and ulcers.

EXAM:
LEFT FOOT - 2 VIEW

[foot lat]
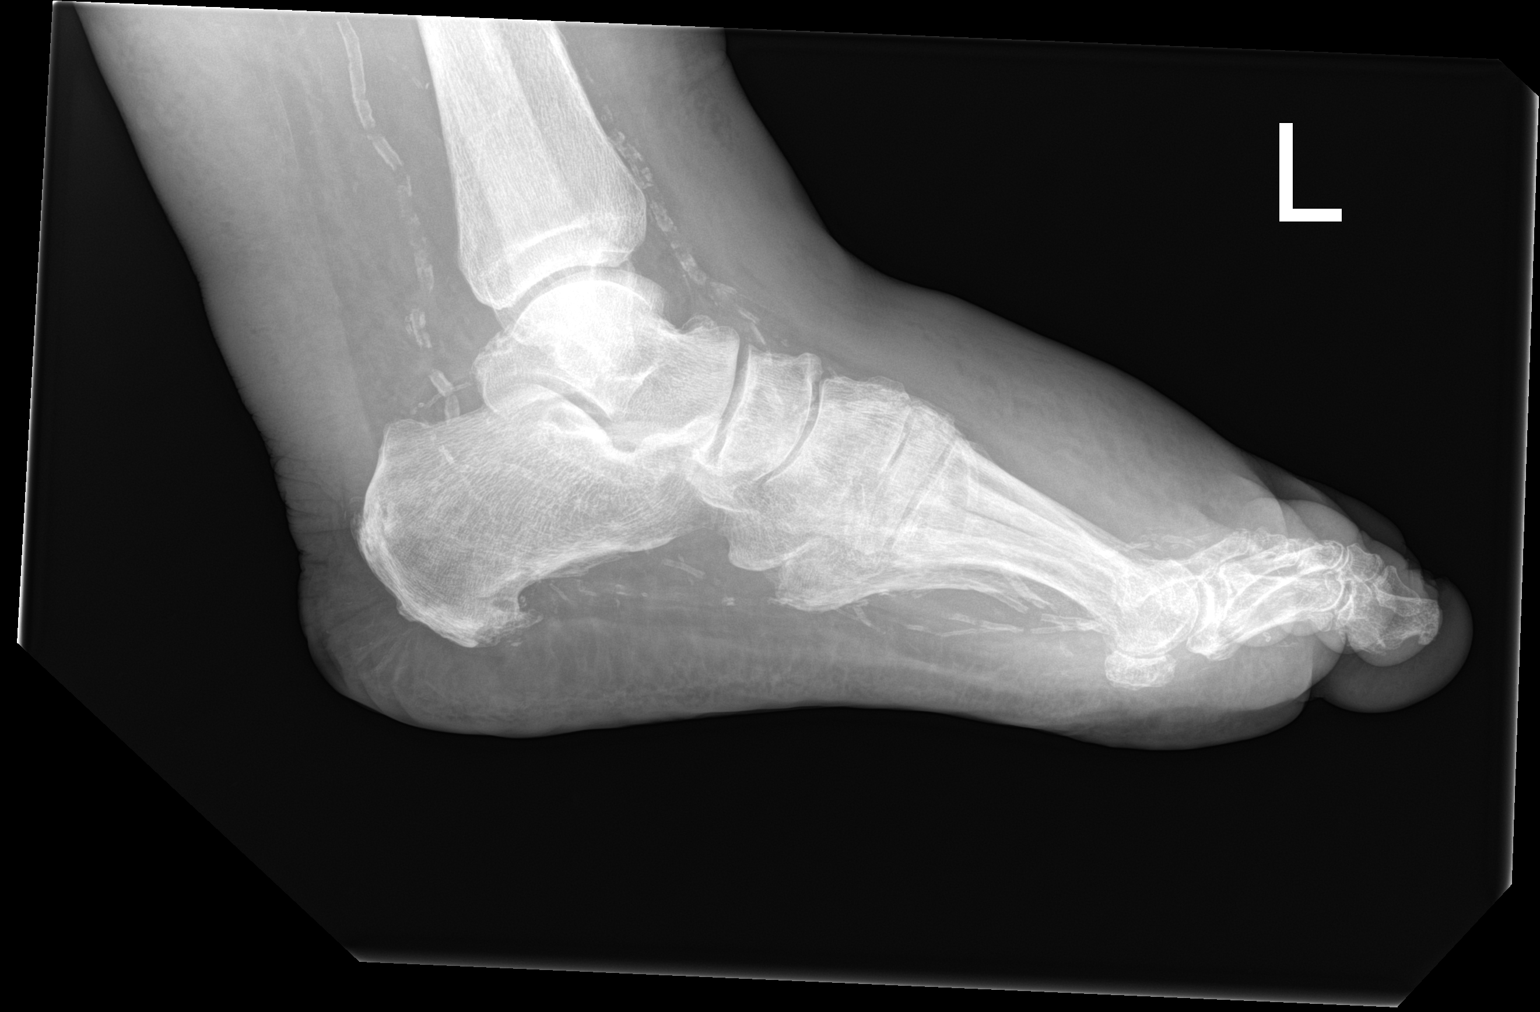

[foot ap]
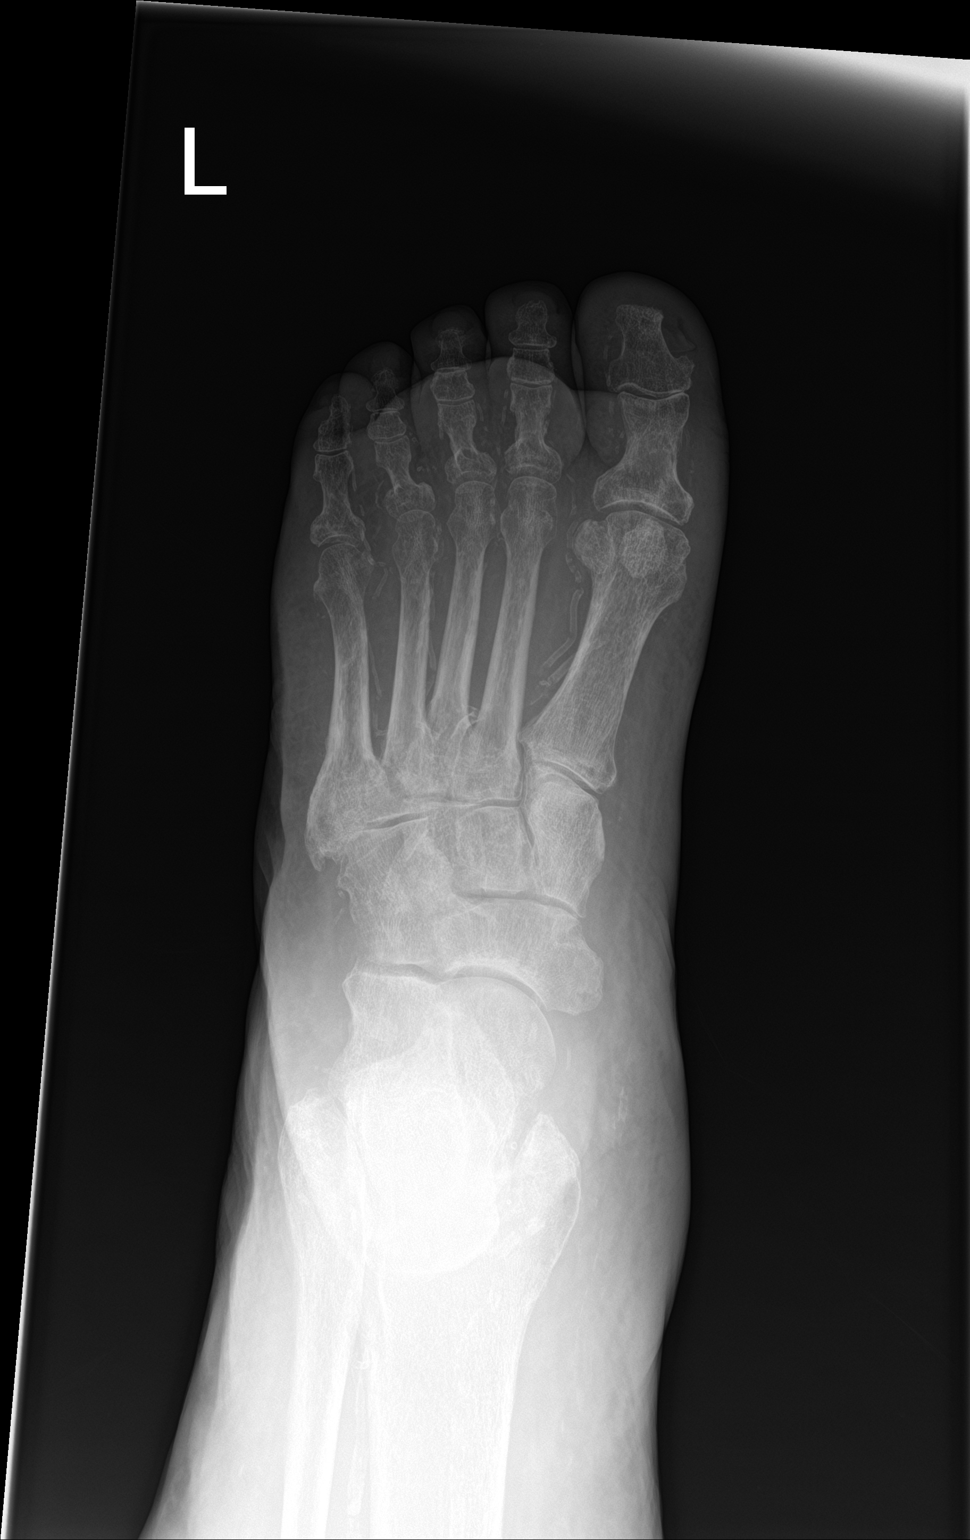

[2 of 2 positions shown; findings below may reference images not displayed]

FINDINGS: Prominent dorsal soft tissue edema that is progressed from prior
exam. No visualized deep soft tissue ulcer. No bony destruction or
periosteal reaction to suggest osteomyelitis. Unchanged mid and
hindfoot osteoarthritis and plantar calcaneal spur. No osseous
erosions. Advanced vascular calcifications.
IMPRESSION: 1. Prominent dorsal soft tissue edema that has progressed from
radiographs and may. No visualized deep soft tissue ulcer. No
evidence of osteomyelitis.
2. Unchanged mid and hindfoot osteoarthritis.
3. Advanced vascular calcifications.

## 2020-01-16 IMAGING — DX DG CHEST 1V PORT
1 series · 1 of 1 positions shown · non-contrast
Comparison: [DATE]

CLINICAL DATA: Altered mental status.

EXAM:
PORTABLE CHEST 1 VIEW

[chest ap]
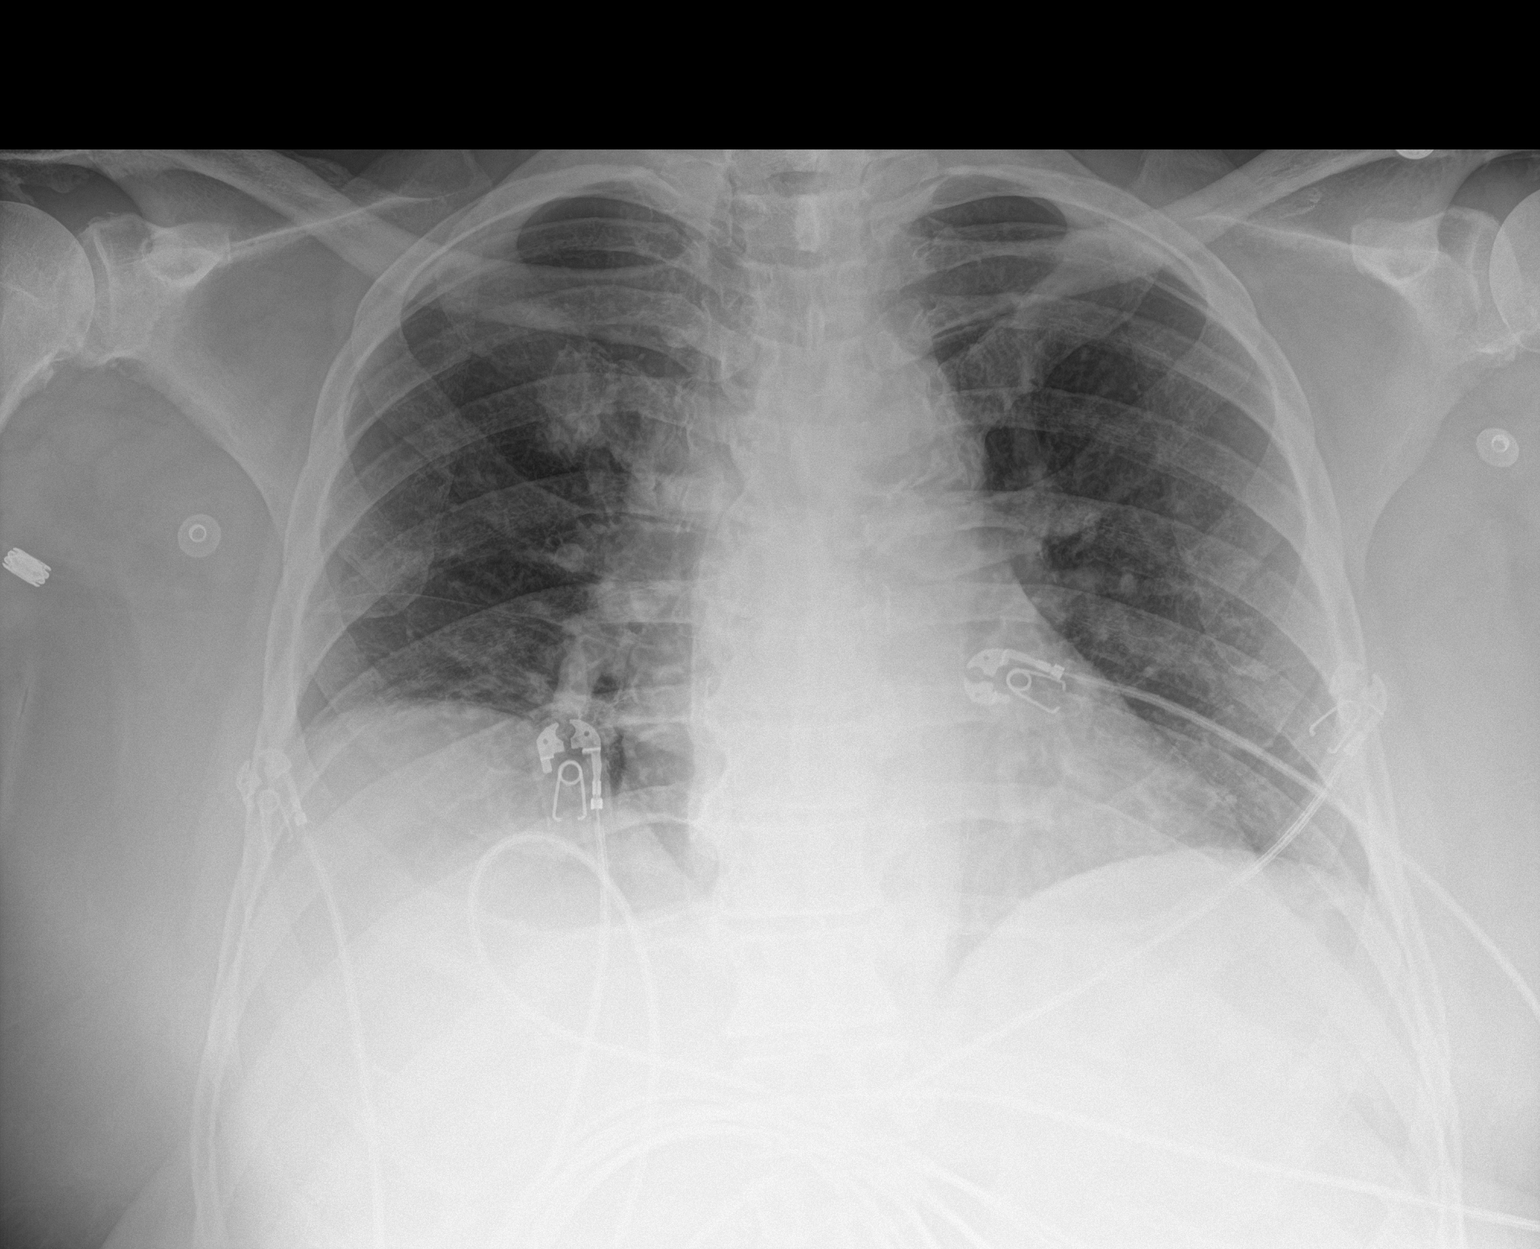

[1 of 1 positions shown; findings below may reference images not displayed]

FINDINGS: There is no evidence of acute infiltrate, pleural effusion or
pneumothorax. The heart size and mediastinal contours are within
normal limits. There is mild to moderate severity calcification of
the aortic arch. The visualized skeletal structures are
unremarkable.
IMPRESSION: No active disease.

## 2020-01-16 MED ORDER — INSULIN ASPART 100 UNIT/ML ~~LOC~~ SOLN
0.0000 [IU] | Freq: Every day | SUBCUTANEOUS | Status: DC
  Administered 2020-01-16 – 2020-01-18 (×2): 2 [IU] via SUBCUTANEOUS

## 2020-01-16 MED ORDER — NIACIN ER (ANTIHYPERLIPIDEMIC) 500 MG PO TBCR
1000.0000 mg | EXTENDED_RELEASE_TABLET | Freq: Every day | ORAL | Status: DC
  Administered 2020-01-16 – 2020-01-19 (×4): 1000 mg via ORAL
  Filled 2020-01-16 (×4): qty 2

## 2020-01-16 MED ORDER — LATANOPROST 0.005 % OP SOLN
1.0000 [drp] | Freq: Every day | OPHTHALMIC | Status: DC
  Administered 2020-01-17 – 2020-01-19 (×3): 1 [drp] via OPHTHALMIC
  Filled 2020-01-16: qty 2.5

## 2020-01-16 MED ORDER — ACETAMINOPHEN 325 MG PO TABS: 650.0000 mg | ORAL_TABLET | Freq: Four times a day (QID) | ORAL | Status: DC | PRN

## 2020-01-16 MED ORDER — ACETAMINOPHEN 500 MG PO TABS
1000.0000 mg | ORAL_TABLET | Freq: Once | ORAL | Status: AC
  Administered 2020-01-16: 1000 mg via ORAL
  Filled 2020-01-16: qty 2

## 2020-01-16 MED ORDER — ASPIRIN 81 MG PO CHEW
162.0000 mg | CHEWABLE_TABLET | Freq: Every day | ORAL | Status: DC
  Administered 2020-01-17 – 2020-01-20 (×4): 162 mg via ORAL
  Filled 2020-01-16 (×4): qty 2

## 2020-01-16 MED ORDER — PANTOPRAZOLE SODIUM 40 MG PO TBEC
40.0000 mg | DELAYED_RELEASE_TABLET | Freq: Every day | ORAL | Status: DC
  Administered 2020-01-16 – 2020-01-20 (×5): 40 mg via ORAL
  Filled 2020-01-16 (×5): qty 1

## 2020-01-16 MED ORDER — DORZOLAMIDE HCL 2 % OP SOLN
1.0000 [drp] | Freq: Two times a day (BID) | OPHTHALMIC | Status: DC
  Administered 2020-01-17 – 2020-01-20 (×7): 1 [drp] via OPHTHALMIC
  Filled 2020-01-16 (×2): qty 10

## 2020-01-16 MED ORDER — ONDANSETRON HCL 4 MG/2ML IJ SOLN: 4.0000 mg | Freq: Four times a day (QID) | INTRAMUSCULAR | Status: DC | PRN

## 2020-01-16 MED ORDER — VANCOMYCIN HCL 1500 MG/300ML IV SOLN: 1500.0000 mg | INTRAVENOUS | Status: DC

## 2020-01-16 MED ORDER — ACETAMINOPHEN 650 MG RE SUPP: 650.0000 mg | Freq: Four times a day (QID) | RECTAL | Status: DC | PRN

## 2020-01-16 MED ORDER — LACTATED RINGERS IV SOLN: INTRAVENOUS | Status: DC

## 2020-01-16 MED ORDER — INSULIN ASPART 100 UNIT/ML ~~LOC~~ SOLN
0.0000 [IU] | Freq: Three times a day (TID) | SUBCUTANEOUS | Status: DC
  Administered 2020-01-17 – 2020-01-19 (×9): 2 [IU] via SUBCUTANEOUS

## 2020-01-16 MED ORDER — SODIUM CHLORIDE 0.9 % IV SOLN
2.0000 g | Freq: Once | INTRAVENOUS | Status: AC
  Administered 2020-01-16: 2 g via INTRAVENOUS
  Filled 2020-01-16: qty 2

## 2020-01-16 MED ORDER — FERROUS SULFATE 325 (65 FE) MG PO TABS
325.0000 mg | ORAL_TABLET | Freq: Every day | ORAL | Status: DC
  Administered 2020-01-17 – 2020-01-20 (×4): 325 mg via ORAL
  Filled 2020-01-16 (×4): qty 1

## 2020-01-16 MED ORDER — VANCOMYCIN HCL IN DEXTROSE 1-5 GM/200ML-% IV SOLN
1000.0000 mg | Freq: Once | INTRAVENOUS | Status: AC
  Administered 2020-01-16: 1000 mg via INTRAVENOUS
  Filled 2020-01-16: qty 200

## 2020-01-16 MED ORDER — ALLOPURINOL 100 MG PO TABS
100.0000 mg | ORAL_TABLET | Freq: Two times a day (BID) | ORAL | Status: DC
  Administered 2020-01-16 – 2020-01-20 (×8): 100 mg via ORAL
  Filled 2020-01-16 (×8): qty 1

## 2020-01-16 MED ORDER — CASTELLANI PAINT MODIFIED 1.5 % EX LIQD: CUTANEOUS | Status: DC

## 2020-01-16 MED ORDER — SODIUM CHLORIDE 0.9 % IV SOLN
2.0000 g | INTRAVENOUS | Status: DC
  Administered 2020-01-17: 2 g via INTRAVENOUS
  Filled 2020-01-16 (×2): qty 2

## 2020-01-16 MED ORDER — LACTATED RINGERS IV BOLUS
1000.0000 mL | Freq: Once | INTRAVENOUS | Status: AC
  Administered 2020-01-16: 1000 mL via INTRAVENOUS

## 2020-01-16 MED ORDER — OMEGA-3-ACID ETHYL ESTERS 1 G PO CAPS
1.0000 g | ORAL_CAPSULE | Freq: Two times a day (BID) | ORAL | Status: DC
  Administered 2020-01-16 – 2020-01-20 (×8): 1 g via ORAL
  Filled 2020-01-16 (×8): qty 1

## 2020-01-16 MED ORDER — ENOXAPARIN SODIUM 40 MG/0.4ML ~~LOC~~ SOLN
40.0000 mg | Freq: Every day | SUBCUTANEOUS | Status: DC
  Administered 2020-01-16 – 2020-01-19 (×4): 40 mg via SUBCUTANEOUS
  Filled 2020-01-16 (×4): qty 0.4

## 2020-01-16 MED ORDER — OMEGA-3 FATTY ACIDS 1000 MG PO CAPS: 1.0000 g | ORAL_CAPSULE | Freq: Two times a day (BID) | ORAL | Status: DC

## 2020-01-16 MED ORDER — METRONIDAZOLE IN NACL 5-0.79 MG/ML-% IV SOLN
500.0000 mg | Freq: Three times a day (TID) | INTRAVENOUS | Status: DC
  Administered 2020-01-16 – 2020-01-17 (×3): 500 mg via INTRAVENOUS
  Filled 2020-01-16 (×3): qty 100

## 2020-01-16 MED ORDER — COLCHICINE 0.6 MG PO TABS
0.6000 mg | ORAL_TABLET | Freq: Every day | ORAL | Status: DC
  Administered 2020-01-17 – 2020-01-20 (×4): 0.6 mg via ORAL
  Filled 2020-01-16 (×4): qty 1

## 2020-01-16 MED ORDER — GABAPENTIN 100 MG PO CAPS
100.0000 mg | ORAL_CAPSULE | Freq: Three times a day (TID) | ORAL | Status: DC
  Administered 2020-01-16 – 2020-01-20 (×11): 100 mg via ORAL
  Filled 2020-01-16 (×11): qty 1

## 2020-01-16 MED ORDER — KETOTIFEN FUMARATE 0.025 % OP SOLN
1.0000 [drp] | Freq: Two times a day (BID) | OPHTHALMIC | Status: DC
  Administered 2020-01-17 – 2020-01-20 (×7): 1 [drp] via OPHTHALMIC
  Filled 2020-01-16 (×2): qty 5

## 2020-01-16 NOTE — ED Provider Notes (Signed)
[facility] EMERGENCY DEPARTMENT Provider Note   CSN: [id number] Arrival date & time: 01/16/20  1806     History Chief Complaint  Patient presents with  . Altered Mental Status    Micheal Walls is a 84 y.o. male.  HPI   84yM with altered mental status. Coming from home. Family called EMS because pt confused and glucose elevated. Unsure when exact onset was. Febrile for EMS. EMS concerned that pre-hospital EKG may be a STEMI so Code STEMI activated. He denies and CP or dyspnea though. He is awake but somewhat slow to respond to questioning. His main complaint is fatigue. Subjective fevers. Pain in L foot but this is not acute. Swelling in LLE is chronic as well. No dysuria. Occasional cough.   Past Medical History:  Diagnosis Date  . Amputated below knee ([place])    right  . Anemia   . Diastolic heart failure ([place])   . Diverticulosis   . DM (diabetes mellitus) ([place])   . Gout   . Hyperlipemia   . OSA on CPAP   . RBBB   . Systemic hypertension    Patient Active Problem List   Diagnosis Date Noted  . Controlled type 2 diabetes mellitus with stable proliferative retinopathy of both eyes, with long-term current use of insulin ([place]) 01/06/2020  . Intermediate stage nonexudative age-related macular degeneration of both eyes 01/06/2020  . Left epiretinal membrane 01/06/2020  . [place] of right macula 01/06/2020  . Aspiration pneumonia ([place]) 11/02/2019  . Severe sepsis ([place]) 11/02/2019  . AKI (acute kidney injury) ([place]) 11/02/2019  . Cellulitis 11/02/2019  . Generalized weakness 11/02/2019  . Elevated TSH 07/10/2018  . Nephropathy 02/28/2018  . Disturbance of skin sensation 02/28/2018  . PAD (peripheral artery disease) ([place]) 07/17/2017  . 110.1 10/21/2013  . Epiphora 04/24/2013  . Laxity of eyelid 04/24/2013  . Anemia 04/09/2013  . History of peripheral vascular disease 04/09/2013  . History of stroke 04/09/2013  . S/P unilateral BKA (below knee  amputation) ([place]) 03/17/2013  . Diabetes mellitus type 2 in obese ([place]) 03/17/2013  . Mixed hyperlipidemia 03/17/2013  . Essential hypertension 03/17/2013  . Gout 03/17/2013  . CKD (chronic kidney disease) stage 3, GFR 30-59 ml/min 03/17/2013  . OSA on CPAP 03/17/2013  . Morbid obesity ([place]) 03/17/2013  . Anemia, iron deficiency 03/17/2013  . Diverticulosis of colon 03/17/2013  . RBBB 03/17/2013  . Punctal stenosis, acquired 10/02/2012   Past Surgical History:  Procedure Laterality Date  . LEG AMPUTATION BELOW KNEE     right  . NM MYOCAR PERF WALL MOTION  09/18/2009   no ischemia     Family History  Problem Relation Age of Onset  . Diabetes Mother   . Heart attack Father   . Cancer Sister    Social History   Tobacco Use  . Smoking status: Former Smoker    Types: Cigars    Quit date: 06/12/2001    Years since quitting: 18.6  . Smokeless tobacco: Never Used  Vaping Use  . Vaping Use: Former  Substance Use Topics  . Alcohol use: No    Alcohol/week: 0.0 standard drinks  . Drug use: No   Home Medications Prior to Admission medications   Medication Sig Start Date End Date Taking? Authorizing Provider  allopurinol (ZYLOPRIM) 100 MG tablet Take 1 tablet (100 mg total) by mouth daily. Patient taking differently: Take 100 mg by mouth 2 (two) times daily.  06/18/18   [name], [place]  antiseptic oral rinse (BIOTENE) LIQD 15 mLs by Mouth Rinse route as needed for dry mouth.    [provider]  aspirin 81 MG tablet Take 162 mg by mouth daily.    [provider]  [name] Use a cotton tip applicator. Paint between toes of left foot as needed once daily. 10/09/18   [name], DPM  colchicine 0.6 MG tablet Take 0.6 mg by mouth as needed (gout flare up).     [provider]  diltiazem (CARDIZEM CD) 300 MG 24 hr capsule TAKE ONE CAPSULE EACH DAY Patient taking differently: Take 300 mg by mouth daily.  08/12/19   [name], [name]  dorzolamide (TRUSOPT) 2 % ophthalmic solution Place 1 drop into both eyes 2 (two) times daily. 06/20/18   [provider]  ferrous sulfate 325 (65 FE) MG tablet Take 325 mg by mouth daily with breakfast.    [provider]  fish oil-omega-3 fatty acids 1000 MG capsule Take 2 g by mouth daily.    [provider]  furosemide (LASIX) 40 MG tablet Take 1 tablet (40 mg total) by mouth daily. 11/07/19   Bonnielee Haff, MD  gabapentin (NEURONTIN) 100 MG capsule Take 1 capsule  by mouth 3  times daily. 01/14/20   [name], [name]  glucose blood (FREESTYLE PRECISION NEO TEST) test strip Use as instructed 07/30/19   [name], [name]  hydrocortisone (ANUSOL-HC) 25 MG suppository Place 25 mg rectally 2 (two) times daily as needed for hemorrhoids or anal itching.    [provider]  ketotifen (ALAWAY) 0.025 % ophthalmic solution Place 1 drop into both eyes 2 (two) times daily.    [provider]  latanoprost (XALATAN) 0.005 % ophthalmic solution Place 1 drop into both eyes at bedtime.  10/12/18   [provider]  losartan (COZAAR) 50 MG tablet Take 1 tablet  by mouth as directed. 01/14/20   [name], [name]  Multiple Vitamin (MULTIVITAMIN) tablet Take 1 tablet by mouth daily.    [provider]  niacin (NIASPAN) 500 MG CR tablet TAKE 2 TABLETS AT BEDTIME 10/29/19   [name], [name]  pantoprazole (PROTONIX) 40 MG tablet Take 1 tablet  by mouth daily. 01/14/20   [name], [name]  Polyvinyl Alcohol-Povidone (REFRESH OP) Apply 1 drop to eye 2 (two) times daily.    [provider]  pramoxine-hydrocortisone (PRAMOSONE) cream Apply topically 3 (three) times daily. Apply topically to hemorrhoids four times daily as needed after bowel movements, for external use only    [provider]  rosuvastatin (CRESTOR) 10 MG tablet Take 1 tablet (10 mg total) by mouth daily. 11/07/19   Bonnielee Haff, MD  Semaglutide, [name], (OZEMPIC, 1  MG/DOSE,) 2 MG/1.5ML SOPN Inject 1 mg into the skin once a week. 09/24/19   [name], [name]    Allergies    Patient has no known allergies.  Review of Systems   Review of Systems All systems reviewed and negative, other than as noted in HPI.  Physical Exam Updated Vital Signs BP (!) 113/52 (BP Location: Right Arm)   Pulse (!) 114   Temp (!) 103.1 F (39.5 C) (Oral)   Resp 18   SpO2 95%   Physical Exam Vitals and nursing note reviewed.  Constitutional:      General: He is not in acute distress.    Appearance: He is well-developed. He is obese.     Comments: Sitting in bed.  Appears tired but not distressed.   HENT:     Head: Normocephalic and atraumatic.  Eyes:     General:        Right eye: No discharge.        Left eye: No discharge.     Conjunctiva/sclera: Conjunctivae normal.  Cardiovascular:     Rate and Rhythm: Regular rhythm. Tachycardia present.     Heart sounds: Normal heart sounds. No murmur heard.  No friction rub. No gallop.   Pulmonary:     Effort: Pulmonary effort is normal. No respiratory distress.     Breath sounds: Normal breath sounds.  Abdominal:     General: There is no distension.     Palpations: Abdomen is soft.     Tenderness: There is no abdominal tenderness.  Musculoskeletal:        General: Swelling present.     Cervical back: Neck supple.     Comments: Healing ulceration L big toe. Swelling and erythema of foot extending into lower leg.   Skin:    General: Skin is warm and dry.  Neurological:     Mental Status: He is alert.  Psychiatric:        Behavior: Behavior normal.        Thought Content: Thought content normal.    ED Results / Procedures / Treatments   Labs (all labs ordered are listed, but only abnormal results are displayed) Labs Reviewed  CBC WITH DIFFERENTIAL/PLATELET - Abnormal; Notable for the following components:      Result Value   WBC 12.0 (*)    RBC 3.93 (*)    Hemoglobin 12.2 (*)    HCT 37.9 (*)    Neutro  Abs 10.7 (*)    Lymphs Abs 0.5 (*)    Abs Immature Granulocytes 0.08 (*)    All other components within normal limits  COMPREHENSIVE METABOLIC PANEL - Abnormal; Notable for the following components:   Glucose, Bld 239 (*)    BUN 56 (*)    Creatinine, Ser 2.80 (*)    Calcium 10.4 (*)    GFR calc non Af Amer 20 (*)    GFR calc Af Amer 23 (*)    All other components within normal limits  CULTURE, BLOOD (ROUTINE X 2)  CULTURE, BLOOD (ROUTINE X 2)  URINE CULTURE  SARS CORONAVIRUS 2 BY RT PCR (HOSPITAL ORDER, [place] [facility])  LACTIC ACID, PLASMA  PROTIME-INR  APTT  PROCALCITONIN  LACTIC ACID, PLASMA  URINALYSIS, COMPLETE (UACMP) WITH MICROSCOPIC   EKG EKG Interpretation  Date/Time:  Thursday January 16 2020 18:10:24 EDT Ventricular Rate:  123 PR Interval:    QRS Duration: 156 QT Interval:  396 QTC Calculation: 567 R Axis:   -63 Text Interpretation: sinus tachcyardia Right bundle branch block Confirmed by Virgel Manifold [phone]) on 01/16/2020 6:58:43 PM  Radiology DG Chest Port 1 View  Result Date: 01/16/2020 CLINICAL DATA:  Altered mental status. EXAM: PORTABLE CHEST 1 VIEW COMPARISON:  December 17, 2019 FINDINGS: There is no evidence of acute infiltrate, pleural effusion or pneumothorax. The heart size and mediastinal contours are within normal limits. There is mild to moderate severity calcification of the aortic arch. The visualized skeletal structures are unremarkable. IMPRESSION: No active disease. Electronically Signed   By: Virgina Norfolk M.D.   On: 01/16/2020 18:52   Procedures Procedures (including critical care time)  Medications Ordered in ED Medications  metroNIDAZOLE (FLAGYL) IVPB 500 mg (500 mg Intravenous New Bag/Given 01/16/20 1926)  vancomycin (VANCOCIN) IVPB 1000 mg/200 mL premix (1,000 mg Intravenous New Bag/Given 01/16/20 1910)  vancomycin (VANCOCIN) IVPB 1000 mg/200 mL premix (has no administration in time range)  vancomycin (VANCOREADY) IVPB  1500 mg/300 mL (has no administration in time range)  ceFEPIme (MAXIPIME) 2 g in sodium chloride 0.9 % 100 mL IVPB (has no administration in time range)  ceFEPIme (MAXIPIME) 2 g in sodium chloride 0.9 % 100 mL IVPB (0 g Intravenous Stopped 01/16/20 1945)  lactated ringers bolus 1,000 mL (1,000 mLs Intravenous New Bag/Given 01/16/20 1906)   ED Course  I have reviewed the triage vital signs and the nursing notes.  Pertinent labs & imaging results that were available during my care of the patient were reviewed by me and considered in my medical decision making (see chart for details).    MDM Rules/Calculators/A&P  84yM with encephalopathy. Likely infectious although I don't have a clear source. Possibly cellulitis of LLE versus venous stasis. Abdomen benign. CXR clear. UA and COVID pending.    Final Clinical Impression(s) / ED Diagnoses Final diagnoses:  Sepsis [facility])    Rx / DC Orders ED Discharge Orders    None       Virgel Manifold, MD 01/20/20 0005

## 2020-01-16 NOTE — ED Notes (Signed)
Pt states he sleeps with a CPAP machine, placed on 2L [place] for comfort.

## 2020-01-16 NOTE — Progress Notes (Signed)
Pharmacy Antibiotic Note  Micheal Walls is a 84 y.o. male admitted on 01/16/2020 with sepsis.  Pharmacy has been consulted for cefepime and vancomycin dosing. The patient is also on metronidazole per MD.  Plan: - Vancomycin IV 2000 mg x1 dose then 1500 q48h thereafter - Obtain vancomycin troughs at steady state when necessary  - Goal trough of 15-20 mcg/mL - Cefepime IV 2 g q24h - Monitor renal function, clinical status, cultures and length of therapy - Deescalate therapy as clinically indicated  Temp (24hrs), Avg:103.1 F (39.5 C), Min:103.1 F (39.5 C), Max:103.1 F (39.5 C)  Recent Labs  Lab 01/16/20 1821 01/16/20 1822  WBC 12.0*  --   CREATININE 2.80*  --   LATICACIDVEN  --  1.9    CrCl cannot be calculated (Unknown ideal weight.).    No Known Allergies  Antimicrobials this admission: 8/5 vancomycin >> 8/5 cefepime >> 8/5 metronidazole >>  Microbiology results: 8/5 UCx: sent 8/5 BCx: sent  Thank you for allowing pharmacy to be a part of this patient's care.  Shauna Hugh, PharmD, [place]  PGY-1 Pharmacy Resident 01/16/2020 7:21 PM  Please check [email or web address] for unit-specific pharmacy phone numbers.

## 2020-01-16 NOTE — ED Notes (Signed)
Called to cancel stemi per dr Wilson Singer call by dee

## 2020-01-16 NOTE — ED Notes (Addendum)
Micheal Walls, son, is leaving bedside & would like updates as able. Phone number: [phone]

## 2020-01-16 NOTE — ED Triage Notes (Signed)
Pt BIB [facility] from home, family called EMS due to pt being altered from his baseline and that his blood sugars have been elevated. Pt also found to be febrile, being treated for an infection. Pt alert, answering questions at this time. Pt activated as a code STEMI in the field, cancelled by Dr. Wilson Singer on arrival to ED.

## 2020-01-16 NOTE — H&P (Signed)
History and Physical    Micheal Walls MVE:[id number] DOB: [birth year] DOA: 01/16/2020  PCP: [name], [name]  Patient coming from: Home  I have personally briefly reviewed patient's old medical records in [place]  Chief Complaint: AMS  HPI: Micheal Walls is a 84 y.o. male with medical history significant of DM2, HTN, gout, R BKA.  Pt presents to ED with 1 day h/o AMS, confusion, lethargy.  Unclear when exact onset was though presumably with in past day or two as nothing of this was mentioned on 8/3 at podiatry office visit.  Pt awake but somewhat slow to respond to questioning.  Main complaint is fatigue.  Subjective fevers, pain in L foot but this isnt acute.  Has chronic swelling in LLE.  No dysuria, occasional cough, no SOB, no CP, no headache nor meningismus.  No abd pain, no N/V/D.   ED Course: Tm 103.1, HR 106, WBC 12.  Nl lactate.  Creat 2.8 (runs in the mid 2s at baseline)  BP 118/57.  BGL 239.   Review of Systems: As per HPI, otherwise all review of systems negative.  Past Medical History:  Diagnosis Date  . Amputated below knee ([place])    right  . Anemia   . Diastolic heart failure ([place])   . Diverticulosis   . DM (diabetes mellitus) ([place])   . Gout   . Hyperlipemia   . OSA on CPAP   . RBBB   . Systemic hypertension     Past Surgical History:  Procedure Laterality Date  . LEG AMPUTATION BELOW KNEE     right  . NM MYOCAR PERF WALL MOTION  09/18/2009   no ischemia     reports that he quit smoking about 18 years ago. His smoking use included cigars. He has never used smokeless tobacco. He reports that he does not drink alcohol and does not use drugs.  No Known Allergies  Family History  Problem Relation Age of Onset  . Diabetes Mother   . Heart attack Father   . Cancer Sister      Prior to Admission medications   Medication Sig Start Date End Date Taking? Authorizing Provider  allopurinol (ZYLOPRIM) 100 MG tablet Take 1 tablet  (100 mg total) by mouth daily. Patient taking differently: Take 100 mg by mouth 2 (two) times daily.  06/18/18  Yes [name], [name]  aspirin 81 MG tablet Take 162 mg by mouth daily.   Yes [provider]  [name] Use a cotton tip applicator. Paint between toes of left foot as needed once daily. Patient taking differently: See admin instructions. Use a cotton tip applicator. Paint between toes of left foot once daily. 10/09/18  Yes [name], DPM  colchicine 0.6 MG tablet Take 0.6 mg by mouth daily.    Yes [provider]  diltiazem (CARDIZEM CD) 300 MG 24 hr capsule TAKE ONE CAPSULE EACH DAY Patient taking differently: Take 300 mg by mouth daily.  08/12/19  Yes [name], [name]  dorzolamide (TRUSOPT) 2 % ophthalmic solution Place 1 drop into both eyes 2 (two) times daily. 06/20/18  Yes [provider]  ferrous sulfate 325 (65 FE) MG tablet Take 325 mg by mouth daily with breakfast.   Yes [provider]  fish oil-omega-3 fatty acids 1000 MG capsule Take 1 g by mouth 2 (two) times daily.    Yes [provider]  furosemide (LASIX) 40 MG tablet Take 1  tablet (40 mg total) by mouth daily. Patient taking differently: Take 40 mg by mouth 2 (two) times daily.  11/07/19  Yes Bonnielee Haff, MD  gabapentin (NEURONTIN) 100 MG capsule Take 1 capsule  by mouth 3  times daily. Patient taking differently: Take 100 mg by mouth 3 (three) times daily.  01/14/20  Yes [name], [name]  Glycerin-Polysorbate 80 (REFRESH DRY EYE THERAPY OP) Place 1 drop into both eyes in the morning and at bedtime.   Yes [provider]  Insulin Degludec (TRESIBA FLEXTOUCH [employer]) Inject 10 mg into the skin at bedtime.   Yes [provider]  ketotifen (ALAWAY) 0.025 % ophthalmic solution Place 1 drop into both eyes 2 (two) times daily.   Yes [provider]  latanoprost (XALATAN) 0.005 % ophthalmic solution Place 1 drop into both  eyes at bedtime.  10/12/18  Yes [provider]  losartan (COZAAR) 100 MG tablet Take 100 mg by mouth at bedtime.   Yes [provider]  pantoprazole (PROTONIX) 40 MG tablet Take 1 tablet  by mouth daily. Patient taking differently: Take 40 mg by mouth daily.  01/14/20  Yes [name], [name]  rosuvastatin (CRESTOR) 10 MG tablet Take 1 tablet (10 mg total) by mouth daily. Patient taking differently: Take 10 mg by mouth every 7 (seven) days.  11/07/19  Yes Bonnielee Haff, MD  Semaglutide, [name], (OZEMPIC, [name],) 2 MG/1.5ML SOPN Inject 1 mg into the skin once a week. 09/24/19  Yes [name], [name]  antiseptic oral rinse (BIOTENE) LIQD 15 mLs by Mouth Rinse route as needed for dry mouth.    [provider]  glucose blood (FREESTYLE PRECISION NEO TEST) test strip Use as instructed 07/30/19   [name], [name]  hydrocortisone (ANUSOL-HC) 25 MG suppository Place 25 mg rectally 2 (two) times daily as needed for hemorrhoids or anal itching.    [provider]  losartan (COZAAR) 50 MG tablet Take 1 tablet  by mouth as directed. Patient not taking: Reported on 01/16/2020 01/14/20   [name], [name]  Multiple Vitamin (MULTIVITAMIN) tablet Take 1 tablet by mouth daily.    [provider]  niacin (NIASPAN) 500 MG CR tablet TAKE 2 TABLETS AT BEDTIME 10/29/19   [name], [name]  Polyvinyl Alcohol-Povidone (REFRESH OP) Apply 1 drop to eye 2 (two) times daily. Patient not taking: Reported on 01/16/2020    [provider]  pramoxine-hydrocortisone (PRAMOSONE) cream Apply topically 3 (three) times daily. Apply topically to hemorrhoids four times daily as needed after bowel movements, for external use only    [provider]    Physical Exam: Vitals:   01/16/20 1810 01/16/20 1910 01/16/20 1930 01/16/20 2045  BP:  (!) 113/52 117/62 (!) 118/57  Pulse:  (!) 114 (!) 110 (!) 106  Resp:  $Remo'18 16 19  'ZwoFr$ Temp: (!) 103.1 F (39.5 C)     TempSrc: Oral       SpO2:  95% 94% 94%    Constitutional: NAD, calm, comfortable Eyes: PERRL, lids and conjunctivae normal ENMT: Mucous membranes are moist. Posterior pharynx clear of any exudate or lesions.Normal dentition.  Neck: normal, supple, no masses, no thyromegaly Respiratory: clear to auscultation bilaterally, no wheezing, no crackles. Normal respiratory effort. No accessory muscle use.  Cardiovascular: Regular rate and rhythm, no murmurs / rubs / gallops. No extremity edema. 2+ pedal pulses. No carotid bruits.  Abdomen: no tenderness, no masses palpated. No hepatosplenomegaly. Bowel sounds positive.  Musculoskeletal: RLE amputation Skin: Ulcer  to L great toe actually looks improved compared to images from podiatry office earlier in July.  L foot looks more edematous though than images. Neurologic: CN 2-12 grossly intact. Sensation intact, DTR normal. Strength 5/5 in all 4.  Psychiatric: Sleepy, slow to wake up and answer questions.   Labs on Admission: I have personally reviewed following labs and imaging studies  CBC: Recent Labs  Lab 01/16/20 1821  WBC 12.0*  NEUTROABS 10.7*  HGB 12.2*  HCT 37.9*  MCV 96.4  PLT 833   Basic Metabolic Panel: Recent Labs  Lab 01/16/20 1821  NA 135  K 4.2  CL 102  CO2 22  GLUCOSE 239*  BUN 56*  CREATININE 2.80*  CALCIUM 10.4*   GFR: CrCl cannot be calculated (Unknown ideal weight.). Liver Function Tests: Recent Labs  Lab 01/16/20 1821  AST 29  ALT 23  ALKPHOS 66  BILITOT 0.8  PROT 7.0  ALBUMIN 3.7   No results for input(s): LIPASE, AMYLASE in the last 168 hours. No results for input(s): AMMONIA in the last 168 hours. Coagulation Profile: Recent Labs  Lab 01/16/20 1821  INR 1.1   Cardiac Enzymes: No results for input(s): CKTOTAL, CKMB, CKMBINDEX, TROPONINI in the last 168 hours. BNP (last 3 results) No results for input(s): PROBNP in the last 8760 hours. HbA1C: No results for input(s): HGBA1C in the last 72 hours. CBG: No  results for input(s): GLUCAP in the last 168 hours. Lipid Profile: No results for input(s): CHOL, HDL, LDLCALC, TRIG, CHOLHDL, LDLDIRECT in the last 72 hours. Thyroid Function Tests: No results for input(s): TSH, T4TOTAL, FREET4, T3FREE, THYROIDAB in the last 72 hours. Anemia Panel: No results for input(s): VITAMINB12, FOLATE, FERRITIN, TIBC, IRON, RETICCTPCT in the last 72 hours. Urine analysis:    Component Value Date/Time   COLORURINE YELLOW 01/08/2011 1558   APPEARANCEUR CLEAR 01/08/2011 1558   LABSPEC 1.018 01/08/2011 1558   PHURINE 5.5 01/08/2011 1558   GLUCOSEU 250 (A) 01/08/2011 1558   HGBUR NEGATIVE 01/08/2011 1558   BILIRUBINUR negative 05/15/2019 1029   KETONESUR NEGATIVE 01/08/2011 1558   PROTEINUR Positive (A) 05/15/2019 1029   PROTEINUR NEGATIVE 01/08/2011 1558   UROBILINOGEN 0.2 05/15/2019 1029   UROBILINOGEN 0.2 01/08/2011 1558   NITRITE negative 05/15/2019 1029   NITRITE NEGATIVE 01/08/2011 1558   LEUKOCYTESUR Negative 05/15/2019 1029    Radiological Exams on Admission: DG Chest Port 1 View  Result Date: 01/16/2020 CLINICAL DATA:  Altered mental status. EXAM: PORTABLE CHEST 1 VIEW COMPARISON:  December 17, 2019 FINDINGS: There is no evidence of acute infiltrate, pleural effusion or pneumothorax. The heart size and mediastinal contours are within normal limits. There is mild to moderate severity calcification of the aortic arch. The visualized skeletal structures are unremarkable. IMPRESSION: No active disease. Electronically Signed   By: Virgina Norfolk M.D.   On: 01/16/2020 18:52    EKG: Independently reviewed.  Assessment/Plan Principal Problem:   Sepsis ([place]) Active Problems:   Essential hypertension   CKD (chronic kidney disease) stage 4, GFR 15-29 ml/min (HCC)   Controlled type 2 diabetes mellitus with stable proliferative retinopathy of both eyes, with long-term current use of insulin (HCC)   Diabetic ulcer of left great toe ([place])    1. Sepsis - with  acute encephalopathy 1. ? Foot as source ? 1. Ulcer looks better, but foot very edematous, ? cellulitis 2. Getting x ray of foot 3. BCx pending 4. Sepsis pathway 5. IVF: 1L LR bolus and LR at 125 cc/hr for  now 6. Tele monitor 7. Repeat CBC/CMP in AM 8. Procalcitonin is elevated 9. Lactate 1.9 10. Cefepime, flagyl, vanc 2. Diabetic ulcer of L great toe - 1. Very small at this point, mostly healed, but now foot swelling noted and sepsis.  ? Cellulitis 3. CKD 4 - 1. Chronic, at or near baseline 4. HTN - 1. Holding home BP meds in setting of sepsis 5. DM2 - 1. Holding home hypoglycemics 2. Mod scale SSI AC/HS  DVT prophylaxis: Lovenox Code Status: Full Family Communication: Family at bedside Disposition Plan: Home after sepsis resolved Consults called: None Admission status: Admit to inpatient  Severity of Illness: The appropriate patient status for this patient is INPATIENT. Inpatient status is judged to be reasonable and necessary in order to provide the required intensity of service to ensure the patient's safety. The patient's presenting symptoms, physical exam findings, and initial radiographic and laboratory data in the context of their chronic comorbidities is felt to place them at high risk for further clinical deterioration. Furthermore, it is not anticipated that the patient will be medically stable for discharge from the hospital within 2 midnights of admission. The following factors support the patient status of inpatient.   IP status due to sepsis with associated AMS.   * I certify that at the point of admission it is my clinical judgment that the patient will require inpatient hospital care spanning beyond 2 midnights from the point of admission due to high intensity of service, high risk for further deterioration and high frequency of surveillance required.*    Kiondra Caicedo M. DO [facility]  How to contact the [facility] Attending or Consulting provider [place]  or covering provider during after hours [place], for this patient?  1. Check the care team in [facility] and look for a) attending/consulting [facility] provider listed and b) the [facility] team listed 2. Log into [email or web address]  Amion Physician Scheduling and messaging for groups and whole hospitals  On call and physician scheduling software for group practices, residents, hospitalists and other medical providers for call, clinic, rotation and shift schedules. On[employer] is a hospital-wide system for scheduling doctors and paging doctors on call. EasyPlot is for scientific plotting and data analysis.  [email or web address]  and use [place]'s universal password to access. If you do not have the password, please contact the hospital operator.  3. Locate the [facility] provider you are looking for under [facility] and page to a number that you can be directly reached. 4. If you still have difficulty reaching the provider, please page the [facility] (Director on Call) for the Hospitalists listed on amion for assistance.  01/16/2020, 9:01 PM

## 2020-01-16 NOTE — ED Notes (Signed)
Pt transported to Xray via stretcher

## 2020-01-17 LAB — COMPREHENSIVE METABOLIC PANEL
ALT: 20 U/L (ref 0–44)
AST: 26 U/L (ref 15–41)
Albumin: 3.2 g/dL — ABNORMAL LOW (ref 3.5–5.0)
Alkaline Phosphatase: 61 U/L (ref 38–126)
Anion gap: 11 (ref 5–15)
BUN: 53 mg/dL — ABNORMAL HIGH (ref 8–23)
CO2: 24 mmol/L (ref 22–32)
Calcium: 10 mg/dL (ref 8.9–10.3)
Chloride: 102 mmol/L (ref 98–111)
Creatinine, Ser: 2.65 mg/dL — ABNORMAL HIGH (ref 0.61–1.24)
GFR calc Af Amer: 25 mL/min — ABNORMAL LOW (ref >60)
GFR calc non Af Amer: 21 mL/min — ABNORMAL LOW (ref >60)
Glucose, Bld: 174 mg/dL — ABNORMAL HIGH (ref 70–99)
Potassium: 4.1 mmol/L (ref 3.5–5.1)
Sodium: 137 mmol/L (ref 135–145)
Total Bilirubin: 0.9 mg/dL (ref 0.3–1.2)
Total Protein: 6.8 g/dL (ref 6.5–8.1)

## 2020-01-17 LAB — BLOOD CULTURE ID PANEL (REFLEXED) - BCID2
A.calcoaceticus-baumannii: NOT DETECTED
A.calcoaceticus-baumannii: NOT DETECTED
Bacteroides fragilis: NOT DETECTED
Bacteroides fragilis: NOT DETECTED
CTX-M ESBL: NOT DETECTED
CTX-M ESBL: NOT DETECTED
Candida albicans: NOT DETECTED
Candida albicans: NOT DETECTED
Candida auris: NOT DETECTED
Candida auris: NOT DETECTED
Candida glabrata: NOT DETECTED
Candida glabrata: NOT DETECTED
Candida krusei: NOT DETECTED
Candida krusei: NOT DETECTED
Candida parapsilosis: NOT DETECTED
Candida parapsilosis: NOT DETECTED
Candida tropicalis: NOT DETECTED
Candida tropicalis: NOT DETECTED
Carbapenem resistance IMP: NOT DETECTED
Carbapenem resistance IMP: NOT DETECTED
Carbapenem resistance KPC: NOT DETECTED
Carbapenem resistance KPC: NOT DETECTED
Carbapenem resistance NDM: NOT DETECTED
Carbapenem resistance NDM: NOT DETECTED
Carbapenem resistance VIM: NOT DETECTED
Carbapenem resistance VIM: NOT DETECTED
Cryptococcus neoformans/gattii: NOT DETECTED
Cryptococcus neoformans/gattii: NOT DETECTED
Enterobacter cloacae complex: NOT DETECTED
Enterobacter cloacae complex: NOT DETECTED
Enterobacterales: NOT DETECTED
Enterobacterales: NOT DETECTED
Enterococcus Faecium: NOT DETECTED
Enterococcus Faecium: NOT DETECTED
Enterococcus faecalis: NOT DETECTED
Enterococcus faecalis: NOT DETECTED
Escherichia coli: NOT DETECTED
Escherichia coli: NOT DETECTED
Haemophilus influenzae: NOT DETECTED
Haemophilus influenzae: NOT DETECTED
Klebsiella aerogenes: NOT DETECTED
Klebsiella aerogenes: NOT DETECTED
Klebsiella oxytoca: NOT DETECTED
Klebsiella oxytoca: NOT DETECTED
Klebsiella pneumoniae: NOT DETECTED
Klebsiella pneumoniae: NOT DETECTED
Listeria monocytogenes: NOT DETECTED
Listeria monocytogenes: NOT DETECTED
Neisseria meningitidis: NOT DETECTED
Neisseria meningitidis: NOT DETECTED
Proteus species: NOT DETECTED
Proteus species: NOT DETECTED
Pseudomonas aeruginosa: DETECTED — AB
Pseudomonas aeruginosa: DETECTED — AB
Salmonella species: NOT DETECTED
Salmonella species: NOT DETECTED
Serratia marcescens: NOT DETECTED
Serratia marcescens: NOT DETECTED
Staphylococcus aureus (BCID): NOT DETECTED
Staphylococcus aureus (BCID): NOT DETECTED
Staphylococcus epidermidis: NOT DETECTED
Staphylococcus epidermidis: NOT DETECTED
Staphylococcus lugdunensis: NOT DETECTED
Staphylococcus lugdunensis: NOT DETECTED
Staphylococcus species: NOT DETECTED
Staphylococcus species: DETECTED — AB
Stenotrophomonas maltophilia: NOT DETECTED
Stenotrophomonas maltophilia: NOT DETECTED
Streptococcus agalactiae: NOT DETECTED
Streptococcus agalactiae: NOT DETECTED
Streptococcus pneumoniae: NOT DETECTED
Streptococcus pneumoniae: NOT DETECTED
Streptococcus pyogenes: NOT DETECTED
Streptococcus pyogenes: NOT DETECTED
Streptococcus species: NOT DETECTED
Streptococcus species: NOT DETECTED

## 2020-01-17 LAB — URINALYSIS, COMPLETE (UACMP) WITH MICROSCOPIC
Bilirubin Urine: NEGATIVE
Glucose, UA: NEGATIVE mg/dL
Hgb urine dipstick: NEGATIVE
Ketones, ur: NEGATIVE mg/dL
Leukocytes,Ua: NEGATIVE
Nitrite: NEGATIVE
Protein, ur: 30 mg/dL — AB
Specific Gravity, Urine: 1.009 (ref 1.005–1.030)
pH: 6 (ref 5.0–8.0)

## 2020-01-17 LAB — CBC
HCT: 37 % — ABNORMAL LOW (ref 39.0–52.0)
Hemoglobin: 12 g/dL — ABNORMAL LOW (ref 13.0–17.0)
MCH: 30.8 pg (ref 26.0–34.0)
MCHC: 32.4 g/dL (ref 30.0–36.0)
MCV: 95.1 fL (ref 80.0–100.0)
Platelets: 151 10*3/uL (ref 150–400)
RBC: 3.89 MIL/uL — ABNORMAL LOW (ref 4.22–5.81)
RDW: 15.3 % (ref 11.5–15.5)
WBC: 15.1 10*3/uL — ABNORMAL HIGH (ref 4.0–10.5)
nRBC: 0 % (ref 0.0–0.2)

## 2020-01-17 LAB — GLUCOSE, CAPILLARY
Glucose-Capillary: 143 mg/dL — ABNORMAL HIGH (ref 70–99)
Glucose-Capillary: 147 mg/dL — ABNORMAL HIGH (ref 70–99)
Glucose-Capillary: 150 mg/dL — ABNORMAL HIGH (ref 70–99)
Glucose-Capillary: 158 mg/dL — ABNORMAL HIGH (ref 70–99)

## 2020-01-17 LAB — LACTIC ACID, PLASMA: Lactic Acid, Venous: 1.4 mmol/L (ref 0.5–1.9)

## 2020-01-17 LAB — PROTIME-INR
INR: 1.1 (ref 0.8–1.2)
Prothrombin Time: 13.8 seconds (ref 11.4–15.2)

## 2020-01-17 LAB — HEMOGLOBIN A1C
Hgb A1c MFr Bld: 6.8 % — ABNORMAL HIGH (ref 4.8–5.6)
Mean Plasma Glucose: 148.46 mg/dL

## 2020-01-17 LAB — PROCALCITONIN: Procalcitonin: 15.96 ng/mL

## 2020-01-17 LAB — CORTISOL-AM, BLOOD: Cortisol - AM: 19 ug/dL (ref 6.7–22.6)

## 2020-01-17 MED ORDER — INSULIN GLARGINE 100 UNIT/ML ~~LOC~~ SOLN
10.0000 [IU] | Freq: Every day | SUBCUTANEOUS | Status: DC
  Administered 2020-01-17 – 2020-01-19 (×3): 10 [IU] via SUBCUTANEOUS
  Filled 2020-01-17 (×5): qty 0.1

## 2020-01-17 MED ORDER — DILTIAZEM HCL ER COATED BEADS 300 MG PO CP24
300.0000 mg | ORAL_CAPSULE | Freq: Every day | ORAL | Status: DC
  Administered 2020-01-17 – 2020-01-20 (×4): 300 mg via ORAL
  Filled 2020-01-17 (×4): qty 1

## 2020-01-17 MED ORDER — HYDROCORTISONE ACETATE 25 MG RE SUPP: 25.0000 mg | Freq: Two times a day (BID) | RECTAL | Status: DC | PRN

## 2020-01-17 MED ORDER — ROSUVASTATIN CALCIUM 5 MG PO TABS
10.0000 mg | ORAL_TABLET | ORAL | Status: DC
  Administered 2020-01-20: 10 mg via ORAL
  Filled 2020-01-17 (×2): qty 2

## 2020-01-17 MED ORDER — PRAMOXINE-HC 1-2.5 % EX CREA: TOPICAL_CREAM | Freq: Three times a day (TID) | CUTANEOUS | Status: DC

## 2020-01-17 MED ORDER — HYDROCORTISONE (PERIANAL) 2.5 % EX CREA
TOPICAL_CREAM | Freq: Four times a day (QID) | CUTANEOUS | Status: DC | PRN
  Filled 2020-01-17: qty 28.35

## 2020-01-17 MED ORDER — FUROSEMIDE 40 MG PO TABS
40.0000 mg | ORAL_TABLET | Freq: Every day | ORAL | Status: DC
  Administered 2020-01-17 – 2020-01-20 (×4): 40 mg via ORAL
  Filled 2020-01-17 (×3): qty 1

## 2020-01-17 MED ORDER — INSULIN DEGLUDEC 100 UNIT/ML ~~LOC~~ SOPN: 10.0000 [IU] | PEN_INJECTOR | Freq: Every day | SUBCUTANEOUS | Status: DC

## 2020-01-17 MED ORDER — SODIUM CHLORIDE 0.9 % IV SOLN
INTRAVENOUS | Status: DC | PRN
  Administered 2020-01-17 – 2020-01-19 (×2): 1000 mL via INTRAVENOUS

## 2020-01-17 NOTE — Progress Notes (Signed)
PHARMACY - PHYSICIAN COMMUNICATION CRITICAL VALUE ALERT - BLOOD CULTURE IDENTIFICATION (BCID)  Micheal Walls is an 84 y.o. male who presented to [facility] on 01/16/2020 with a chief complaint of sepsis secondary to cellulitis  Assessment:  1 aerobic bottle positive for GNR - Pseudamonas and 1 anaerobic bottle positive for GPC - Staph species no resistance Likely source cellilitis  Name of physician (or Provider) Contacted: Dr. Doristine Bosworth  Current antibiotics: Cefepime  Changes to prescribed antibiotics recommended:  Patient is on recommended antibiotics - No changes needed  Results for orders placed or performed during the hospital encounter of 01/16/20  Blood Culture ID Panel (Reflexed) (Collected: 01/16/2020  6:21 PM)  Result Value Ref Range   Enterococcus faecalis NOT DETECTED NOT DETECTED   Enterococcus Faecium NOT DETECTED NOT DETECTED   Listeria monocytogenes NOT DETECTED NOT DETECTED   Staphylococcus species NOT DETECTED NOT DETECTED   Staphylococcus aureus (BCID) NOT DETECTED NOT DETECTED   Staphylococcus epidermidis NOT DETECTED NOT DETECTED   Staphylococcus lugdunensis NOT DETECTED NOT DETECTED   Streptococcus species NOT DETECTED NOT DETECTED   Streptococcus agalactiae NOT DETECTED NOT DETECTED   Streptococcus pneumoniae NOT DETECTED NOT DETECTED   Streptococcus pyogenes NOT DETECTED NOT DETECTED   A.calcoaceticus-baumannii NOT DETECTED NOT DETECTED   Bacteroides fragilis NOT DETECTED NOT DETECTED   Enterobacterales NOT DETECTED NOT DETECTED   Enterobacter cloacae complex NOT DETECTED NOT DETECTED   Escherichia coli NOT DETECTED NOT DETECTED   Klebsiella aerogenes NOT DETECTED NOT DETECTED   Klebsiella oxytoca NOT DETECTED NOT DETECTED   Klebsiella pneumoniae NOT DETECTED NOT DETECTED   Proteus species NOT DETECTED NOT DETECTED   Salmonella species NOT DETECTED NOT DETECTED   Serratia marcescens NOT DETECTED NOT DETECTED   Haemophilus influenzae NOT DETECTED NOT  DETECTED   Neisseria meningitidis NOT DETECTED NOT DETECTED   Pseudomonas aeruginosa DETECTED (A) NOT DETECTED   Stenotrophomonas maltophilia NOT DETECTED NOT DETECTED   Candida albicans NOT DETECTED NOT DETECTED   Candida auris NOT DETECTED NOT DETECTED   Candida glabrata NOT DETECTED NOT DETECTED   Candida krusei NOT DETECTED NOT DETECTED   Candida parapsilosis NOT DETECTED NOT DETECTED   Candida tropicalis NOT DETECTED NOT DETECTED   Cryptococcus neoformans/gattii NOT DETECTED NOT DETECTED   CTX-M ESBL NOT DETECTED NOT DETECTED   Carbapenem resistance IMP NOT DETECTED NOT DETECTED   Carbapenem resistance KPC NOT DETECTED NOT DETECTED   Carbapenem resistance NDM NOT DETECTED NOT DETECTED   Carbapenem resistance VIM NOT DETECTED NOT DETECTED    [name] 01/17/2020  6:20 PM

## 2020-01-17 NOTE — Progress Notes (Signed)
Patient's daughter, Dwana Melena called this RN requesting and update on patient. Dwana Melena was updated on plan of care. All questions and concerns were answered.

## 2020-01-17 NOTE — Progress Notes (Signed)
PROGRESS NOTE    Micheal Walls  EUM:[id number] DOB: Dec 05, 1935 DOA: 01/16/2020 PCP: [name], [name]   Brief Narrative:  HPI: Micheal Walls is a 84 y.o. male with medical history significant of DM2, HTN, gout, R BKA.  Pt presents to ED with 1 day h/o AMS, confusion, lethargy.  Unclear when exact onset was though presumably with in past day or two as nothing of this was mentioned on 8/3 at podiatry office visit.  Pt awake but somewhat slow to respond to questioning.  Main complaint is fatigue.  Subjective fevers, pain in L foot but this isnt acute.  Has chronic swelling in LLE.  No dysuria, occasional cough, no SOB, no CP, no headache nor meningismus.  No abd pain, no N/V/D.   ED Course: Tm 103.1, HR 106, WBC 12.  Nl lactate.  Creat 2.8 (runs in the mid 2s at baseline)  BP 118/57.  BGL 239.   Assessment & Plan:   Principal Problem:   Sepsis ([place]) Active Problems:   Essential hypertension   CKD (chronic kidney disease) stage 4, GFR 15-29 ml/min (HCC)   Controlled type 2 diabetes mellitus with stable proliferative retinopathy of both eyes, with long-term current use of insulin (HCC)   Acute metabolic encephalopathy   Diabetic ulcer of left great toe (HCC)  Sepsis secondary to left lower extremity cellulitis, POA: Patient met sepsis criteria based on fever 103.1, leukocytosis, tachycardia.  He was presumed to have sepsis secondary to foot ulcer however on my examination, he seems to have left lower extremity cellulitis.  His toe ulcer has not changed.  Is not edematous and nontender with no erythema.  He has elevated temperature and tenderness of the left lower extremity.  Picture as below.  No evidence of osteomyelitis on x-ray.  Nonpurulent.  Do not suspect MRSA.  Discontinue vancomycin and Flagyl and continue only cefepime.  Follow labs.  Lactic acid normal.  CKD stage IV: At baseline.  Monitor.  Essential hypertension: Blood pressure controlled.  Resume  diltiazem but hold losartan.  Type 2 diabetes mellitus: Hold oral hypoglycemic agents.  Resume long-acting insulin at night from home and continue SSI.  DVT prophylaxis: enoxaparin (LOVENOX) injection 40 mg Start: 01/16/20 2200   Code Status: Full Code  Family Communication: None present at bedside.  Plan of care discussed with patient in length and he verbalized understanding and agreed with it.  Status is: Inpatient  Remains inpatient appropriate because:Inpatient level of care appropriate due to severity of illness   Dispo: The patient is from: Home              Anticipated d/c is to: Home              Anticipated d/c date is: 1 day              Patient currently is not medically stable to d/c.        Estimated body mass index is 35.78 kg/m as calculated from the following:   Height as of this encounter: 6' 1" (1.854 m).   Weight as of this encounter: 123 kg.      Nutritional status:               Consultants:   None  Procedures:   None  Antimicrobials:  Anti-infectives (From admission, onward)   Start     Dose/Rate Route Frequency Ordered Stop   01/18/20 1900  vancomycin (VANCOREADY) IVPB 1500 mg/300 mL     Discontinue  1,500 mg 150 mL/hr over 120 Minutes Intravenous Every 48 hours 01/16/20 1929     01/17/20 1900  ceFEPIme (MAXIPIME) 2 g in sodium chloride 0.9 % 100 mL IVPB     Discontinue     2 g 200 mL/hr over 30 Minutes Intravenous Every 24 hours 01/16/20 1929     01/16/20 2000  vancomycin (VANCOCIN) IVPB 1000 mg/200 mL premix        1,000 mg 200 mL/hr over 60 Minutes Intravenous  Once 01/16/20 1929 01/16/20 2129   01/16/20 1830  ceFEPIme (MAXIPIME) 2 g in sodium chloride 0.9 % 100 mL IVPB        2 g 200 mL/hr over 30 Minutes Intravenous  Once 01/16/20 1819 01/16/20 1945   01/16/20 1830  metroNIDAZOLE (FLAGYL) IVPB 500 mg     Discontinue     500 mg 100 mL/hr over 60 Minutes Intravenous Every 8 hours 01/16/20 1819     01/16/20 1830   vancomycin (VANCOCIN) IVPB 1000 mg/200 mL premix        1,000 mg 200 mL/hr over 60 Minutes Intravenous  Once 01/16/20 1819 01/16/20 2021         Subjective: Patient seen and examined.  Feels better.  No specific complaint.  Objective: Vitals:   01/17/20 0059 01/17/20 0435 01/17/20 0754 01/17/20 1113  BP: (!) 119/53 (!) 134/59 135/68 116/64  Pulse: 88 92 (!) 110 96  Resp: _0 Temp: 98.3 F (36.8 C) 97.9 F (36.6 C) (!) 97.5 F (36.4 C) 98.2 F (36.8 C)  TempSrc: Oral Oral Oral Oral  SpO2: 98% 99% 100% 100%  Weight: 123 kg     Height: 6' 1" (1.854 m)       Intake/Output Summary (Last 24 hours) at 01/17/2020 1249 Last data filed at 01/17/2020 1200 Gross per 24 hour  Intake 910.62 ml  Output 1500 ml  Net -589.38 ml   Filed Weights   01/17/20 0059  Weight: 123 kg    Examination:  General exam: Appears calm and comfortable  Respiratory system: Clear to auscultation. Respiratory effort normal. Cardiovascular system: S1 & S2 heard, RRR. No JVD, murmurs, rubs, gallops or clicks. No pedal edema. Gastrointestinal system: Abdomen is nondistended, soft and nontender. No organomegaly or masses felt. Normal bowel sounds heard. Central nervous system: Alert and oriented. No focal neurological deficits. Extremities: Symmetric 5 x 5 power. Skin: Erythema involving left lower extremity, pictured below.  Dry diabetic foot ulcer on the toe left foot Psychiatry: Judgement and insight appear normal. Mood & affect appropriate.       Data Reviewed: I have personally reviewed following labs and imaging studies  CBC: Recent Labs  Lab 01/16/20 1821 01/17/20 0624  WBC 12.0* 15.1*  NEUTROABS 10.7*  --   HGB 12.2* 12.0*  HCT 37.9* 37.0*  MCV 96.4 95.1  PLT 163 638   Basic Metabolic Panel: Recent Labs  Lab 01/16/20 1821 01/17/20 0624  NA 135 137  K 4.2 4.1  CL 102 102  CO2 22 24  GLUCOSE 239* 174*  BUN 56* 53*  CREATININE 2.80* 2.65*  CALCIUM 10.4* 10.0    GFR: Estimated Creatinine Clearance: 29.5 mL/min (A) (by C-G formula based on SCr of 2.65 mg/dL (H)). Liver Function Tests: Recent Labs  Lab 01/16/20 1821 01/17/20 0624  AST 29 26  ALT 23 20  ALKPHOS 66 61  BILITOT 0.8 0.9  PROT 7.0 6.8  ALBUMIN 3.7 3.2*   No results for input(s): LIPASE, AMYLASE  in the last 168 hours. No results for input(s): AMMONIA in the last 168 hours. Coagulation Profile: Recent Labs  Lab 01/16/20 1821 01/17/20 0624  INR 1.1 1.1   Cardiac Enzymes: No results for input(s): CKTOTAL, CKMB, CKMBINDEX, TROPONINI in the last 168 hours. BNP (last 3 results) No results for input(s): PROBNP in the last 8760 hours. HbA1C: Recent Labs    01/17/20 0624  HGBA1C 6.8*   CBG: Recent Labs  Lab 01/16/20 2205 01/17/20 0640 01/17/20 1110  GLUCAP 202* 143* 147*   Lipid Profile: No results for input(s): CHOL, HDL, LDLCALC, TRIG, CHOLHDL, LDLDIRECT in the last 72 hours. Thyroid Function Tests: No results for input(s): TSH, T4TOTAL, FREET4, T3FREE, THYROIDAB in the last 72 hours. Anemia Panel: No results for input(s): VITAMINB12, FOLATE, FERRITIN, TIBC, IRON, RETICCTPCT in the last 72 hours. Sepsis Labs: Recent Labs  Lab 01/16/20 1821 01/16/20 1822 01/17/20 0624  PROCALCITON 7.73  --  15.96  LATICACIDVEN  --  1.9 1.4    Recent Results (from the past 240 hour(s))  SARS Coronavirus 2 by RT PCR (hospital order, performed in [facility] hospital lab) Nasopharyngeal Nasopharyngeal Swab     Status: None   Collection Time: 01/16/20  6:23 PM   Specimen: Nasopharyngeal Swab  Result Value Ref Range Status   SARS Coronavirus 2 NEGATIVE NEGATIVE Final    Comment: (NOTE) SARS-CoV-2 target nucleic acids are NOT DETECTED.  The SARS-CoV-2 RNA is generally detectable in upper and lower respiratory specimens during the acute phase of infection. The lowest concentration of SARS-CoV-2 viral copies this assay can detect is 250 copies / mL. A negative result does not  preclude SARS-CoV-2 infection and should not be used as the sole basis for treatment or other patient management decisions.  A negative result may occur with improper specimen collection / handling, submission of specimen other than nasopharyngeal swab, presence of viral mutation(s) within the areas targeted by this assay, and inadequate number of viral copies (<250 copies / mL). A negative result must be combined with clinical observations, patient history, and epidemiological information.  Fact Sheet for Patients:   StrictlyIdeas.no  Fact Sheet for Healthcare Providers: BankingDealers.co.za  This test is not yet approved or  cleared by the [place] FDA and has been authorized for detection and/or diagnosis of SARS-CoV-2 by FDA under an Emergency Use Authorization (EUA).  This EUA will remain in effect (meaning this test can be used) for the duration of the COVID-19 declaration under Section 564(b)(1) of the Act, 21 U.S.C. section 360bbb-3(b)(1), unless the authorization is terminated or revoked sooner.  Performed at [facility], [place] [address] [place], [place] [address]       Radiology Studies: DG Chest Port 1 View  Result Date: 01/16/2020 CLINICAL DATA:  Altered mental status. EXAM: PORTABLE CHEST 1 VIEW COMPARISON:  December 17, 2019 FINDINGS: There is no evidence of acute infiltrate, pleural effusion or pneumothorax. The heart size and mediastinal contours are within normal limits. There is mild to moderate severity calcification of the aortic arch. The visualized skeletal structures are unremarkable. IMPRESSION: No active disease. Electronically Signed   By: Virgina Norfolk M.D.   On: 01/16/2020 18:52   DG Foot 2 Views Left  Result Date: 01/16/2020 CLINICAL DATA:  Left foot swelling and ulcers. EXAM: LEFT FOOT - 2 VIEW COMPARISON:  Foot radiograph 11/02/2019 FINDINGS: Prominent dorsal soft tissue edema that is progressed  from prior exam. No visualized deep soft tissue ulcer. No bony destruction or periosteal reaction  to suggest osteomyelitis. Unchanged mid and hindfoot osteoarthritis and plantar calcaneal spur. No osseous erosions. Advanced vascular calcifications. IMPRESSION: 1. Prominent dorsal soft tissue edema that has progressed from radiographs and may. No visualized deep soft tissue ulcer. No evidence of osteomyelitis. 2. Unchanged mid and hindfoot osteoarthritis. 3. Advanced vascular calcifications. Electronically Signed   By: Keith Rake M.D.   On: 01/16/2020 21:17    Scheduled Meds: . allopurinol  100 mg Oral BID  . aspirin  162 mg Oral Daily  . colchicine  0.6 mg Oral Daily  . dorzolamide  1 drop Both Eyes BID  . enoxaparin (LOVENOX) injection  40 mg Subcutaneous QHS  . ferrous sulfate  325 mg Oral Q breakfast  . gabapentin  100 mg Oral TID  . insulin aspart  0-15 Units Subcutaneous TID WC  . insulin aspart  0-5 Units Subcutaneous QHS  . ketotifen  1 drop Both Eyes BID  . latanoprost  1 drop Both Eyes QHS  . niacin  1,000 mg Oral QHS  . omega-3 acid ethyl esters  1 g Oral BID  . pantoprazole  40 mg Oral Daily   Continuous Infusions: . ceFEPime (MAXIPIME) IV    . lactated ringers 125 mL/hr at 01/16/20 2218  . metronidazole 500 mg (01/17/20 1021)  . [START ON 01/18/2020] vancomycin       LOS: 1 day   Time spent: 35 minutes   Darliss Cheney, MD [facility]  01/17/2020, 12:49 PM   To contact the attending provider between 7A-7P or the covering provider during after hours 7P-7A, please log into the web site [email or web address].

## 2020-01-17 NOTE — Consult Note (Signed)
   [facility] Inpatient Consult   01/17/2020  Micheal Walls February 18, 1936 [id number]  [employer] [ACO] Patient: Medicare  Patient was screened for [facility] services. Patient will have the transition of care call conducted by the primary care provider. This patient is also in an Embedded practice which has a chronic disease management Embedded Care Management team.  Plan:Will follow for progress and check for needs for Embedded Chronic Care Management with [facility] if appropriate.  Please contact for further questions,  Natividad Brood, RN BSN [facility] Liaison  [phone] business mobile phone Toll free office [phone]  Fax number: [phone] [email or web address]

## 2020-01-17 NOTE — Progress Notes (Signed)
RT note. Pt. Refused CPAP tonight. Pt. On 2LNC sat 99%. RT will continue to monitor.

## 2020-01-17 NOTE — Progress Notes (Signed)
RT note. Pt. Refused CPAP tonight. Pt. On 2LNC sat 98%. RT will continue to monitor.

## 2020-01-18 LAB — GLUCOSE, CAPILLARY
Glucose-Capillary: 142 mg/dL — ABNORMAL HIGH (ref 70–99)
Glucose-Capillary: 139 mg/dL — ABNORMAL HIGH (ref 70–99)
Glucose-Capillary: 150 mg/dL — ABNORMAL HIGH (ref 70–99)
Glucose-Capillary: 204 mg/dL — ABNORMAL HIGH (ref 70–99)

## 2020-01-18 LAB — URINE CULTURE: Culture: NO GROWTH

## 2020-01-18 LAB — CBC WITH DIFFERENTIAL/PLATELET
Abs Immature Granulocytes: 0.11 10*3/uL — ABNORMAL HIGH (ref 0.00–0.07)
Basophils Absolute: 0 10*3/uL (ref 0.0–0.1)
Basophils Relative: 0 %
Eosinophils Absolute: 0.2 10*3/uL (ref 0.0–0.5)
Eosinophils Relative: 1 %
HCT: 31.1 % — ABNORMAL LOW (ref 39.0–52.0)
Hemoglobin: 10 g/dL — ABNORMAL LOW (ref 13.0–17.0)
Immature Granulocytes: 1 %
Lymphocytes Relative: 11 %
Lymphs Abs: 1.4 10*3/uL (ref 0.7–4.0)
MCH: 30.5 pg (ref 26.0–34.0)
MCHC: 32.2 g/dL (ref 30.0–36.0)
MCV: 94.8 fL (ref 80.0–100.0)
Monocytes Absolute: 0.7 10*3/uL (ref 0.1–1.0)
Monocytes Relative: 6 %
Neutro Abs: 10.3 10*3/uL — ABNORMAL HIGH (ref 1.7–7.7)
Neutrophils Relative %: 81 %
Platelets: 122 10*3/uL — ABNORMAL LOW (ref 150–400)
RBC: 3.28 MIL/uL — ABNORMAL LOW (ref 4.22–5.81)
RDW: 15.3 % (ref 11.5–15.5)
WBC: 12.7 10*3/uL — ABNORMAL HIGH (ref 4.0–10.5)
nRBC: 0 % (ref 0.0–0.2)

## 2020-01-18 LAB — BASIC METABOLIC PANEL
Anion gap: 8 (ref 5–15)
BUN: 49 mg/dL — ABNORMAL HIGH (ref 8–23)
CO2: 24 mmol/L (ref 22–32)
Calcium: 9.4 mg/dL (ref 8.9–10.3)
Chloride: 104 mmol/L (ref 98–111)
Creatinine, Ser: 2.16 mg/dL — ABNORMAL HIGH (ref 0.61–1.24)
GFR calc Af Amer: 32 mL/min — ABNORMAL LOW (ref >60)
GFR calc non Af Amer: 28 mL/min — ABNORMAL LOW (ref >60)
Glucose, Bld: 145 mg/dL — ABNORMAL HIGH (ref 70–99)
Potassium: 3.9 mmol/L (ref 3.5–5.1)
Sodium: 136 mmol/L (ref 135–145)

## 2020-01-18 MED ORDER — GUAIFENESIN-DM 100-10 MG/5ML PO SYRP
5.0000 mL | ORAL_SOLUTION | ORAL | Status: DC | PRN
  Administered 2020-01-18: 5 mL via ORAL
  Filled 2020-01-18: qty 5

## 2020-01-18 MED ORDER — SODIUM CHLORIDE 0.9 % IV SOLN
2.0000 g | Freq: Two times a day (BID) | INTRAVENOUS | Status: DC
  Administered 2020-01-18 – 2020-01-20 (×5): 2 g via INTRAVENOUS
  Filled 2020-01-18 (×6): qty 2

## 2020-01-18 NOTE — Progress Notes (Signed)
PROGRESS NOTE    [name]  QPR:[id number] DOB: Oct 12, 1935 DOA: 01/16/2020 PCP: [name], [name]   Brief Narrative:  Micheal Walls is a 84 y.o. male with medical history significant of DM2, HTN, gout, R BKA who presented to ED with 1 day h/o AMS, confusion, lethargy.  Unclear when exact onset was though presumably with in past day or two as nothing of this was mentioned on 8/3 at podiatry office visit. Has chronic swelling in LLE.  No dysuria, occasional cough, no SOB, no CP, no headache nor meningismus.  No abd pain, no N/V/D.  Upon arrival to ED his temperature was 103.1, heart rate 106 and white blood cells 12.  Lactic acid was normal.  Creatinine was 2.8.  Blood pressure was normal.  Patient was diagnosed with sepsis initially presumed to be due to chronic left foot ulcer however later he was diagnosed with left lower extremity cellulitis.  Assessment & Plan:   Principal Problem:   Sepsis ([place]) Active Problems:   Essential hypertension   CKD (chronic kidney disease) stage 4, GFR 15-29 ml/min (HCC)   Controlled type 2 diabetes mellitus with stable proliferative retinopathy of both eyes, with long-term current use of insulin (HCC)   Acute metabolic encephalopathy   Diabetic ulcer of left great toe (HCC)  Sepsis secondary to left lower extremity cellulitis/gram-positive and gram-negative bacteremia, POA: Patient met sepsis criteria based on fever 103.1, leukocytosis, tachycardia.  He was presumed to have sepsis secondary to foot ulcer however on my examination on 01/17/2020, he seemed to have left lower extremity cellulitis.  His toe ulcer has not changed.  Is not edematous and nontender with no erythema. No evidence of osteomyelitis on x-ray.  Nonpurulent.  Do not suspect MRSA.  Blood culture now growing gram-positive cocci and Pseudomonas.  Cefepime will cover both of them.  We will continue this and follow final sensitivities.  Blood culture repeated yesterday.  CKD stage IV:  At baseline.  Monitor.  Essential hypertension: Blood pressure controlled.  Continue diltiazem but hold losartan.  Type 2 diabetes mellitus: Hold oral hypoglycemic agents.  Blood sugar controlled.  Continue current Lantus of 10 units and SSI.    DVT prophylaxis: enoxaparin (LOVENOX) injection 40 mg Start: 01/16/20 2200   Code Status: Full Code  Family Communication: None present at bedside.  Plan of care discussed with patient in length and he verbalized understanding and agreed with it.  I also tried to call patient's daughter Micheal Walls and left a voicemail.  Status is: Inpatient  Remains inpatient appropriate because:Inpatient level of care appropriate due to severity of illness   Dispo: The patient is from: Home              Anticipated d/c is to: Home              Anticipated d/c date is: 1 day              Patient currently is not medically stable to d/c.        Estimated body mass index is 35.78 kg/m as calculated from the following:   Height as of this encounter: _0  (1.854 m).   Weight as of this encounter: 123 kg.      Nutritional status:               Consultants:   None  Procedures:   None  Antimicrobials:  Anti-infectives (From admission, onward)   Start     Dose/Rate Route Frequency Ordered  Stop   01/18/20 1900  vancomycin (VANCOREADY) IVPB 1500 mg/300 mL  Status:  Discontinued        1,500 mg 150 mL/hr over 120 Minutes Intravenous Every 48 hours 01/16/20 1929 01/17/20 1252   01/18/20 1100  ceFEPIme (MAXIPIME) 2 g in sodium chloride 0.9 % 100 mL IVPB     Discontinue     2 g 200 mL/hr over 30 Minutes Intravenous Every 12 hours 01/18/20 1030     01/17/20 1800  ceFEPIme (MAXIPIME) 2 g in sodium chloride 0.9 % 100 mL IVPB  Status:  Discontinued        2 g 200 mL/hr over 30 Minutes Intravenous Every 24 hours 01/16/20 1929 01/18/20 1030   01/16/20 2000  vancomycin (VANCOCIN) IVPB 1000 mg/200 mL premix        1,000 mg 200 mL/hr over 60 Minutes  Intravenous  Once 01/16/20 1929 01/16/20 2129   01/16/20 1830  ceFEPIme (MAXIPIME) 2 g in sodium chloride 0.9 % 100 mL IVPB        2 g 200 mL/hr over 30 Minutes Intravenous  Once 01/16/20 1819 01/16/20 1945   01/16/20 1830  metroNIDAZOLE (FLAGYL) IVPB 500 mg  Status:  Discontinued        500 mg 100 mL/hr over 60 Minutes Intravenous Every 8 hours 01/16/20 1819 01/17/20 1252   01/16/20 1830  vancomycin (VANCOCIN) IVPB 1000 mg/200 mL premix        1,000 mg 200 mL/hr over 60 Minutes Intravenous  Once 01/16/20 1819 01/16/20 2021         Subjective: Patient seen and examined.  He states that he feels better than yesterday.  No complaints.  Objective: Vitals:   01/17/20 2018 01/18/20 0136 01/18/20 0427 01/18/20 0831  BP: (!) 111/55 (!) 106/50 (!) 117/52 (!) 118/48  Pulse: 83 90 88 85  Resp: _0 Temp: 98.8 F (37.1 C) 98.8 F (37.1 C) 98.9 F (37.2 C) 98.4 F (36.9 C)  TempSrc: Oral Oral Oral Oral  SpO2: 100% 99% 100% 98%  Weight:      Height:        Intake/Output Summary (Last 24 hours) at 01/18/2020 1247 Last data filed at 01/18/2020 0600 Gross per 24 hour  Intake 3287.41 ml  Output 1525 ml  Net 1762.41 ml   Filed Weights   01/17/20 0059  Weight: 123 kg    Examination:  General exam: Appears calm and comfortable  Respiratory system: Clear to auscultation. Respiratory effort normal. Cardiovascular system: S1 & S2 heard, RRR. No JVD, murmurs, rubs, gallops or clicks. No pedal edema. Gastrointestinal system: Abdomen is nondistended, soft and nontender. No organomegaly or masses felt. Normal bowel sounds heard. Central nervous system: Alert and oriented. No focal neurological deficits. Extremities: Symmetric 5 x 5 power. Skin: Erythema in the left lower extremity has improved compared to yesterday. Psychiatry: Judgement and insight appear normal. Mood & affect appropriate.        Data Reviewed: I have personally reviewed following labs and imaging  studies  CBC: Recent Labs  Lab 01/16/20 1821 01/17/20 0624 01/18/20 0640  WBC 12.0* 15.1* 12.7*  NEUTROABS 10.7*  --  10.3*  HGB 12.2* 12.0* 10.0*  HCT 37.9* 37.0* 31.1*  MCV 96.4 95.1 94.8  PLT 163 151 638*   Basic Metabolic Panel: Recent Labs  Lab 01/16/20 1821 01/17/20 0624 01/18/20 0640  NA 135 137 136  K 4.2 4.1 3.9  CL 102 102 104  CO2 22 24 24  GLUCOSE 239* 174* 145*  BUN 56* 53* 49*  CREATININE 2.80* 2.65* 2.16*  CALCIUM 10.4* 10.0 9.4   GFR: Estimated Creatinine Clearance: 36.2 mL/min (A) (by C-G formula based on SCr of 2.16 mg/dL (H)). Liver Function Tests: Recent Labs  Lab 01/16/20 1821 01/17/20 0624  AST 29 26  ALT 23 20  ALKPHOS 66 61  BILITOT 0.8 0.9  PROT 7.0 6.8  ALBUMIN 3.7 3.2*   No results for input(s): LIPASE, AMYLASE in the last 168 hours. No results for input(s): AMMONIA in the last 168 hours. Coagulation Profile: Recent Labs  Lab 01/16/20 1821 01/17/20 0624  INR 1.1 1.1   Cardiac Enzymes: No results for input(s): CKTOTAL, CKMB, CKMBINDEX, TROPONINI in the last 168 hours. BNP (last 3 results) No results for input(s): PROBNP in the last 8760 hours. HbA1C: Recent Labs    01/17/20 0624  HGBA1C 6.8*   CBG: Recent Labs  Lab 01/17/20 1110 01/17/20 1642 01/17/20 2144 01/18/20 0614 01/18/20 1159  GLUCAP 147* 150* 158* 142* 139*   Lipid Profile: No results for input(s): CHOL, HDL, LDLCALC, TRIG, CHOLHDL, LDLDIRECT in the last 72 hours. Thyroid Function Tests: No results for input(s): TSH, T4TOTAL, FREET4, T3FREE, THYROIDAB in the last 72 hours. Anemia Panel: No results for input(s): VITAMINB12, FOLATE, FERRITIN, TIBC, IRON, RETICCTPCT in the last 72 hours. Sepsis Labs: Recent Labs  Lab 01/16/20 1821 01/16/20 1822 01/17/20 0624  PROCALCITON 7.73  --  15.96  LATICACIDVEN  --  1.9 1.4    Recent Results (from the past 240 hour(s))  Culture, blood (x 2)     Status: Abnormal (Preliminary result)   Collection Time:  01/16/20  6:21 PM   Specimen: BLOOD  Result Value Ref Range Status   Specimen Description BLOOD RIGHT ANTECUBITAL  Final   Special Requests   Final    BOTTLES DRAWN AEROBIC AND ANAEROBIC Blood Culture adequate volume   Culture  Setup Time   Final    GRAM NEGATIVE RODS AEROBIC BOTTLE ONLY Organism ID to follow CRITICAL RESULT CALLED TO, READ BACK BY AND VERIFIED WITH: Thelma Comp PharmD 16:00 01/27/20 ([email or web address]) GRAM POSITIVE COCCI GRAM NEGATIVE RODS ANAEROBIC BOTTLE ONLY CRITICAL RESULT CALLED TO, READ BACK BY AND VERIFIED WITH: K,PIERCE _0  01/17/20 EB    Culture (A)  Final    PSEUDOMONAS AERUGINOSA SUSCEPTIBILITIES TO FOLLOW STAPHYLOCOCCUS SPECIES (COAGULASE NEGATIVE) THE SIGNIFICANCE OF ISOLATING THIS ORGANISM FROM A SINGLE SET OF BLOOD CULTURES WHEN MULTIPLE SETS ARE DRAWN IS UNCERTAIN. PLEASE NOTIFY THE MICROBIOLOGY DEPARTMENT WITHIN ONE WEEK IF SPECIATION AND SENSITIVITIES ARE REQUIRED. Performed at [facility], [place] [address] [place], [place] [address]    Report Status PENDING  Incomplete  Blood Culture ID Panel (Reflexed)     Status: Abnormal   Collection Time: 01/16/20  6:21 PM  Result Value Ref Range Status   Enterococcus faecalis NOT DETECTED NOT DETECTED Final   Enterococcus Faecium NOT DETECTED NOT DETECTED Final   Listeria monocytogenes NOT DETECTED NOT DETECTED Final   Staphylococcus species DETECTED (A) NOT DETECTED Final    Comment: RESULT CALLED TO, READ BACK BY AND VERIFIED WITH: K,PIERCE _1  01/17/20 EB    Staphylococcus aureus (BCID) NOT DETECTED NOT DETECTED Final   Staphylococcus epidermidis NOT DETECTED NOT DETECTED Final   Staphylococcus lugdunensis NOT DETECTED NOT DETECTED Final   Streptococcus species NOT DETECTED NOT DETECTED Final   Streptococcus agalactiae NOT DETECTED NOT DETECTED Final   Streptococcus pneumoniae NOT DETECTED NOT DETECTED Final   Streptococcus pyogenes NOT  DETECTED NOT DETECTED Final   A.calcoaceticus-baumannii NOT  DETECTED NOT DETECTED Final   Bacteroides fragilis NOT DETECTED NOT DETECTED Final   Enterobacterales NOT DETECTED NOT DETECTED Final   Enterobacter cloacae complex NOT DETECTED NOT DETECTED Final   Escherichia coli NOT DETECTED NOT DETECTED Final   Klebsiella aerogenes NOT DETECTED NOT DETECTED Final   Klebsiella oxytoca NOT DETECTED NOT DETECTED Final   Klebsiella pneumoniae NOT DETECTED NOT DETECTED Final   Proteus species NOT DETECTED NOT DETECTED Final   Salmonella species NOT DETECTED NOT DETECTED Final   Serratia marcescens NOT DETECTED NOT DETECTED Final   Haemophilus influenzae NOT DETECTED NOT DETECTED Final   Neisseria meningitidis NOT DETECTED NOT DETECTED Final   Pseudomonas aeruginosa DETECTED (A) NOT DETECTED Final    Comment: RESULT CALLED TO, READ BACK BY AND VERIFIED WITH: K,PIERCE _0  01/17/20 EB    Stenotrophomonas maltophilia NOT DETECTED NOT DETECTED Final   Candida albicans NOT DETECTED NOT DETECTED Final   Candida auris NOT DETECTED NOT DETECTED Final   Candida glabrata NOT DETECTED NOT DETECTED Final   Candida krusei NOT DETECTED NOT DETECTED Final   Candida parapsilosis NOT DETECTED NOT DETECTED Final   Candida tropicalis NOT DETECTED NOT DETECTED Final   Cryptococcus neoformans/gattii NOT DETECTED NOT DETECTED Final   CTX-M ESBL NOT DETECTED NOT DETECTED Final   Carbapenem resistance IMP NOT DETECTED NOT DETECTED Final   Carbapenem resistance KPC NOT DETECTED NOT DETECTED Final   Carbapenem resistance NDM NOT DETECTED NOT DETECTED Final   Carbapenem resistance VIM NOT DETECTED NOT DETECTED Final    Comment: Performed at [facility], [address], [place], [place] [address]  Blood Culture ID Panel (Reflexed)     Status: Abnormal   Collection Time: 01/16/20  6:21 PM  Result Value Ref Range Status   Enterococcus faecalis NOT DETECTED NOT DETECTED Final   Enterococcus Faecium NOT DETECTED NOT DETECTED Final   Listeria monocytogenes NOT DETECTED NOT  DETECTED Final   Staphylococcus species NOT DETECTED NOT DETECTED Final   Staphylococcus aureus (BCID) NOT DETECTED NOT DETECTED Final   Staphylococcus epidermidis NOT DETECTED NOT DETECTED Final   Staphylococcus lugdunensis NOT DETECTED NOT DETECTED Final   Streptococcus species NOT DETECTED NOT DETECTED Final   Streptococcus agalactiae NOT DETECTED NOT DETECTED Final   Streptococcus pneumoniae NOT DETECTED NOT DETECTED Final   Streptococcus pyogenes NOT DETECTED NOT DETECTED Final   A.calcoaceticus-baumannii NOT DETECTED NOT DETECTED Final   Bacteroides fragilis NOT DETECTED NOT DETECTED Final   Enterobacterales NOT DETECTED NOT DETECTED Final   Enterobacter cloacae complex NOT DETECTED NOT DETECTED Final   Escherichia coli NOT DETECTED NOT DETECTED Final   Klebsiella aerogenes NOT DETECTED NOT DETECTED Final   Klebsiella oxytoca NOT DETECTED NOT DETECTED Final   Klebsiella pneumoniae NOT DETECTED NOT DETECTED Final   Proteus species NOT DETECTED NOT DETECTED Final   Salmonella species NOT DETECTED NOT DETECTED Final   Serratia marcescens NOT DETECTED NOT DETECTED Final   Haemophilus influenzae NOT DETECTED NOT DETECTED Final   Neisseria meningitidis NOT DETECTED NOT DETECTED Final   Pseudomonas aeruginosa DETECTED (A) NOT DETECTED Final    Comment: CRITICAL RESULT CALLED TO, READ BACK BY AND VERIFIED WITH: Thelma Comp PharmD 16:00 01/17/20 ([email or web address])    Stenotrophomonas maltophilia NOT DETECTED NOT DETECTED Final   Candida albicans NOT DETECTED NOT DETECTED Final   Candida auris NOT DETECTED NOT DETECTED Final   Candida glabrata NOT DETECTED NOT DETECTED Final   Candida krusei NOT  DETECTED NOT DETECTED Final   Candida parapsilosis NOT DETECTED NOT DETECTED Final   Candida tropicalis NOT DETECTED NOT DETECTED Final   Cryptococcus neoformans/gattii NOT DETECTED NOT DETECTED Final   CTX-M ESBL NOT DETECTED NOT DETECTED Final   Carbapenem resistance IMP NOT DETECTED NOT DETECTED  Final   Carbapenem resistance KPC NOT DETECTED NOT DETECTED Final   Carbapenem resistance NDM NOT DETECTED NOT DETECTED Final   Carbapenem resistance VIM NOT DETECTED NOT DETECTED Final    Comment: Performed at [facility], [place] [address], [place], [place] [address]  SARS Coronavirus 2 by RT PCR (hospital order, performed in [facility] hospital lab) Nasopharyngeal Nasopharyngeal Swab     Status: None   Collection Time: 01/16/20  6:23 PM   Specimen: Nasopharyngeal Swab  Result Value Ref Range Status   SARS Coronavirus 2 NEGATIVE NEGATIVE Final    Comment: (NOTE) SARS-CoV-2 target nucleic acids are NOT DETECTED.  The SARS-CoV-2 RNA is generally detectable in upper and lower respiratory specimens during the acute phase of infection. The lowest concentration of SARS-CoV-2 viral copies this assay can detect is 250 copies / mL. A negative result does not preclude SARS-CoV-2 infection and should not be used as the sole basis for treatment or other patient management decisions.  A negative result may occur with improper specimen collection / handling, submission of specimen other than nasopharyngeal swab, presence of viral mutation(s) within the areas targeted by this assay, and inadequate number of viral copies (<250 copies / mL). A negative result must be combined with clinical observations, patient history, and epidemiological information.  Fact Sheet for Patients:   StrictlyIdeas.no  Fact Sheet for Healthcare Providers: BankingDealers.co.za  This test is not yet approved or  cleared by the [place] FDA and has been authorized for detection and/or diagnosis of SARS-CoV-2 by FDA under an Emergency Use Authorization (EUA).  This EUA will remain in effect (meaning this test can be used) for the duration of the COVID-19 declaration under Section 564(b)(1) of the Act, 21 U.S.C. section 360bbb-3(b)(1), unless the authorization is  terminated or revoked sooner.  Performed at [facility], [place] [address], [place], [place] [address]   Urine culture     Status: None   Collection Time: 01/17/20  5:01 AM   Specimen: Urine, Catheterized  Result Value Ref Range Status   Specimen Description URINE, CATHETERIZED  Final   Special Requests NONE  Final   Culture   Final    NO GROWTH Performed at [place] [address] [place], [place] [address]    Report Status 01/18/2020 FINAL  Final  Culture, blood (Routine X 2) w Reflex to ID Panel     Status: None (Preliminary result)   Collection Time: 01/17/20  6:32 AM   Specimen: BLOOD  Result Value Ref Range Status   Specimen Description BLOOD LEFT ANTECUBITAL  Final   Special Requests   Final    BOTTLES DRAWN AEROBIC AND ANAEROBIC Blood Culture results may not be optimal due to an excessive volume of blood received in culture bottles   Culture   Final    NO GROWTH 1 DAY Performed at [facility], [place] [address] [place], [place] [address]    Report Status PENDING  Incomplete      Radiology Studies: DG Chest Port 1 View  Result Date: 01/16/2020 CLINICAL DATA:  Altered mental status. EXAM: PORTABLE CHEST 1 VIEW COMPARISON:  December 17, 2019 FINDINGS: There is  no evidence of acute infiltrate, pleural effusion or pneumothorax. The heart size and mediastinal contours are within normal limits. There is mild to moderate severity calcification of the aortic arch. The visualized skeletal structures are unremarkable. IMPRESSION: No active disease. Electronically Signed   By: Virgina Norfolk M.D.   On: 01/16/2020 18:52   DG Foot 2 Views Left  Result Date: 01/16/2020 CLINICAL DATA:  Left foot swelling and ulcers. EXAM: LEFT FOOT - 2 VIEW COMPARISON:  Foot radiograph 11/02/2019 FINDINGS: Prominent dorsal soft tissue edema that is progressed from prior exam. No visualized deep soft tissue ulcer. No bony destruction or periosteal reaction to suggest osteomyelitis.  Unchanged mid and hindfoot osteoarthritis and plantar calcaneal spur. No osseous erosions. Advanced vascular calcifications. IMPRESSION: 1. Prominent dorsal soft tissue edema that has progressed from radiographs and may. No visualized deep soft tissue ulcer. No evidence of osteomyelitis. 2. Unchanged mid and hindfoot osteoarthritis. 3. Advanced vascular calcifications. Electronically Signed   By: Keith Rake M.D.   On: 01/16/2020 21:17    Scheduled Meds: . allopurinol  100 mg Oral BID  . aspirin  162 mg Oral Daily  . colchicine  0.6 mg Oral Daily  . diltiazem  300 mg Oral Daily  . dorzolamide  1 drop Both Eyes BID  . enoxaparin (LOVENOX) injection  40 mg Subcutaneous QHS  . ferrous sulfate  325 mg Oral Q breakfast  . furosemide  40 mg Oral Daily  . gabapentin  100 mg Oral TID  . insulin aspart  0-15 Units Subcutaneous TID WC  . insulin aspart  0-5 Units Subcutaneous QHS  . insulin glargine  10 Units Subcutaneous QHS  . ketotifen  1 drop Both Eyes BID  . latanoprost  1 drop Both Eyes QHS  . niacin  1,000 mg Oral QHS  . omega-3 acid ethyl esters  1 g Oral BID  . pantoprazole  40 mg Oral Daily  . [START ON 01/20/2020] rosuvastatin  10 mg Oral Q7 days   Continuous Infusions: . sodium chloride 1,000 mL (01/17/20 1655)  . ceFEPime (MAXIPIME) IV 2 g (01/18/20 1231)  . lactated ringers 125 mL/hr at 01/18/20 0854     LOS: 2 days   Time spent: 30 minutes   Darliss Cheney, MD [facility]  01/18/2020, 12:47 PM   To contact the attending provider between 7A-7P or the covering provider during after hours 7P-7A, please log into the web site [email or web address].

## 2020-01-18 NOTE — Plan of Care (Signed)
  Problem: Health Behavior/Discharge Planning: Goal: Ability to manage health-related needs will improve Outcome: Progressing   Problem: Clinical Measurements: Goal: Ability to maintain clinical measurements within normal limits will improve Outcome: Progressing   Problem: Elimination: Goal: Will not experience complications related to urinary retention Outcome: Progressing

## 2020-01-18 NOTE — Progress Notes (Signed)
Pharmacy Antibiotic Note  Micheal Walls is a 84 y.o. male admitted on 01/16/2020 with sepsis.  Pharmacy has been consulted for cefepime dosing.   Renal function improving, meets criteria for increased dosing frequency. 2 of 2 blood cultures (8/5) positive for pseudomonas. Cefepime is clinically appropriate. 1 of 2 blood cultures (8/5) staph species likely contaminant.   Plan: - Cefepime IV 2 g q12h - Monitor renal function, clinical status, cultures and length of therapy - Deescalate therapy as clinically indicated  Temp (24hrs), Avg:98.6 F (37 C), Min:98.2 F (36.8 C), Max:98.9 F (37.2 C)  Recent Labs  Lab 01/16/20 1821 01/16/20 1822 01/17/20 0624 01/18/20 0640  WBC 12.0*  --  15.1* 12.7*  CREATININE 2.80*  --  2.65* 2.16*  LATICACIDVEN  --  1.9 1.4  --     Estimated Creatinine Clearance: 36.2 mL/min (A) (by C-G formula based on SCr of 2.16 mg/dL (H)).    No Known Allergies  Antimicrobials this admission: 8/5 cefepime >> 8/5 vancomycin >> 8/5 8/5 metronidazole >> 8/6  Microbiology results: 8/5 UCx: sent 8/5 BCx: 2/2 Pseudomonas, 1/2 staph species.  8/6 BCx: pending, no growth to date  Thank you for allowing pharmacy to be a part of this patient's care.  Norina Buzzard, PharmD PGY1 Pharmacy Resident 01/18/2020 10:34 AM  Please check [email or web address] for unit-specific pharmacy phone numbers.

## 2020-01-19 LAB — CBC WITH DIFFERENTIAL/PLATELET
Abs Immature Granulocytes: 0.05 10*3/uL (ref 0.00–0.07)
Basophils Absolute: 0 10*3/uL (ref 0.0–0.1)
Basophils Relative: 0 %
Eosinophils Absolute: 0.3 10*3/uL (ref 0.0–0.5)
Eosinophils Relative: 3 %
HCT: 31.4 % — ABNORMAL LOW (ref 39.0–52.0)
Hemoglobin: 10.3 g/dL — ABNORMAL LOW (ref 13.0–17.0)
Immature Granulocytes: 1 %
Lymphocytes Relative: 12 %
Lymphs Abs: 1.2 10*3/uL (ref 0.7–4.0)
MCH: 31.2 pg (ref 26.0–34.0)
MCHC: 32.8 g/dL (ref 30.0–36.0)
MCV: 95.2 fL (ref 80.0–100.0)
Monocytes Absolute: 0.6 10*3/uL (ref 0.1–1.0)
Monocytes Relative: 6 %
Neutro Abs: 7.4 10*3/uL (ref 1.7–7.7)
Neutrophils Relative %: 78 %
Platelets: 161 10*3/uL (ref 150–400)
RBC: 3.3 MIL/uL — ABNORMAL LOW (ref 4.22–5.81)
RDW: 15.3 % (ref 11.5–15.5)
WBC: 9.4 10*3/uL (ref 4.0–10.5)
nRBC: 0 % (ref 0.0–0.2)

## 2020-01-19 LAB — BASIC METABOLIC PANEL
Anion gap: 12 (ref 5–15)
BUN: 39 mg/dL — ABNORMAL HIGH (ref 8–23)
CO2: 23 mmol/L (ref 22–32)
Calcium: 9.5 mg/dL (ref 8.9–10.3)
Chloride: 105 mmol/L (ref 98–111)
Creatinine, Ser: 1.8 mg/dL — ABNORMAL HIGH (ref 0.61–1.24)
GFR calc Af Amer: 40 mL/min — ABNORMAL LOW (ref >60)
GFR calc non Af Amer: 34 mL/min — ABNORMAL LOW (ref >60)
Glucose, Bld: 166 mg/dL — ABNORMAL HIGH (ref 70–99)
Potassium: 4 mmol/L (ref 3.5–5.1)
Sodium: 140 mmol/L (ref 135–145)

## 2020-01-19 LAB — CULTURE, BLOOD (ROUTINE X 2): Special Requests: ADEQUATE

## 2020-01-19 LAB — GLUCOSE, CAPILLARY
Glucose-Capillary: 145 mg/dL — ABNORMAL HIGH (ref 70–99)
Glucose-Capillary: 178 mg/dL — ABNORMAL HIGH (ref 70–99)
Glucose-Capillary: 131 mg/dL — ABNORMAL HIGH (ref 70–99)
Glucose-Capillary: 190 mg/dL — ABNORMAL HIGH (ref 70–99)

## 2020-01-19 MED ORDER — BISACODYL 10 MG RE SUPP
10.0000 mg | Freq: Once | RECTAL | Status: AC
  Administered 2020-01-19: 10 mg via RECTAL
  Filled 2020-01-19: qty 1

## 2020-01-19 MED ORDER — INSULIN ASPART 100 UNIT/ML ~~LOC~~ SOLN
0.0000 [IU] | Freq: Three times a day (TID) | SUBCUTANEOUS | Status: DC
  Administered 2020-01-20: 3 [IU] via SUBCUTANEOUS

## 2020-01-19 NOTE — Progress Notes (Signed)
PROGRESS NOTE    Micheal Walls  CWU:[id number] DOB: Jan 19, 1936 DOA: 01/16/2020 PCP: [name], [name]   Brief Narrative:  Micheal Walls is a 84 y.o. male with medical history significant of DM2, HTN, gout, R BKA who presented to ED with 1 day h/o AMS, confusion, lethargy.  Unclear when exact onset was though presumably with in past day or two as nothing of this was mentioned on 8/3 at podiatry office visit. Has chronic swelling in LLE.  No dysuria, occasional cough, no SOB, no CP, no headache nor meningismus.  No abd pain, no N/V/D.  Upon arrival to ED his temperature was 103.1, heart rate 106 and white blood cells 12.  Lactic acid was normal.  Creatinine was 2.8.  Blood pressure was normal.  Patient was diagnosed with sepsis initially presumed to be due to chronic left foot ulcer however later he was diagnosed with left lower extremity cellulitis.  Assessment & Plan:   Principal Problem:   Sepsis ([place]) Active Problems:   Essential hypertension   CKD (chronic kidney disease) stage 4, GFR 15-29 ml/min (HCC)   Controlled type 2 diabetes mellitus with stable proliferative retinopathy of both eyes, with long-term current use of insulin (HCC)   Acute metabolic encephalopathy   Diabetic ulcer of left great toe (HCC)  Sepsis secondary to left lower extremity cellulitis/gram-positive and gram-negative bacteremia, POA: Patient met sepsis criteria based on fever 103.1, leukocytosis, tachycardia.  He was presumed to have sepsis secondary to foot ulcer however on my examination on 01/17/2020, he seemed to have left lower extremity cellulitis.  His toe ulcer has not changed.  Is not edematous and nontender with no erythema. No evidence of osteomyelitis on x-ray.  Nonpurulent.  Do not suspect MRSA.  Blood culture now growing coagulase-negative gram-positive cocci (which is likely contaminant ) and Pseudomonas.  Final sensitivities still pending.  Continue cefepime.  Repeat blood culture from 01/17/2020  are negative so far  CKD stage IV: At baseline.  Monitor.  Essential hypertension: Blood pressure controlled.  Continue diltiazem but hold losartan.  Type 2 diabetes mellitus: Hold oral hypoglycemic agents.  Blood sugar controlled.  Continue current Lantus of 10 units and SSI.    DVT prophylaxis: enoxaparin (LOVENOX) injection 40 mg Start: 01/16/20 2200   Code Status: Full Code  Family Communication: None present at bedside.  Plan of care discussed with patient in length and he verbalized understanding and agreed with it.  I called his daughter [name] yesterday and today and left voicemail both times.  Status is: Inpatient  Remains inpatient appropriate because:Inpatient level of care appropriate due to severity of illness   Dispo: The patient is from: Home              Anticipated d/c is to: Home              Anticipated d/c date is: 1 day              Patient currently is not medically stable to d/c.        Estimated body mass index is 36.5 kg/m as calculated from the following:   Height as of this encounter: 6' 1"  (1.854 m).   Weight as of this encounter: 125.5 kg.      Nutritional status:               Consultants:   None  Procedures:   None  Antimicrobials:  Anti-infectives (From admission, onward)   Start  Dose/Rate Route Frequency Ordered Stop   01/18/20 1900  vancomycin (VANCOREADY) IVPB 1500 mg/300 mL  Status:  Discontinued        1,500 mg 150 mL/hr over 120 Minutes Intravenous Every 48 hours 01/16/20 1929 01/17/20 1252   01/18/20 1100  ceFEPIme (MAXIPIME) 2 g in sodium chloride 0.9 % 100 mL IVPB     Discontinue     2 g 200 mL/hr over 30 Minutes Intravenous Every 12 hours 01/18/20 1030     01/17/20 1800  ceFEPIme (MAXIPIME) 2 g in sodium chloride 0.9 % 100 mL IVPB  Status:  Discontinued        2 g 200 mL/hr over 30 Minutes Intravenous Every 24 hours 01/16/20 1929 01/18/20 1030   01/16/20 2000  vancomycin (VANCOCIN) IVPB 1000 mg/200 mL  premix        1,000 mg 200 mL/hr over 60 Minutes Intravenous  Once 01/16/20 1929 01/16/20 2129   01/16/20 1830  ceFEPIme (MAXIPIME) 2 g in sodium chloride 0.9 % 100 mL IVPB        2 g 200 mL/hr over 30 Minutes Intravenous  Once 01/16/20 1819 01/16/20 1945   01/16/20 1830  metroNIDAZOLE (FLAGYL) IVPB 500 mg  Status:  Discontinued        500 mg 100 mL/hr over 60 Minutes Intravenous Every 8 hours 01/16/20 1819 01/17/20 1252   01/16/20 1830  vancomycin (VANCOCIN) IVPB 1000 mg/200 mL premix        1,000 mg 200 mL/hr over 60 Minutes Intravenous  Once 01/16/20 1819 01/16/20 2021         Subjective: Patient seen and examined.  No complaints.  Objective: Vitals:   01/18/20 1657 01/18/20 2023 01/19/20 0507 01/19/20 0723  BP: (!) 120/55 (!) 127/53 139/62 (!) 109/58  Pulse: 76 79 76 74  Resp: 20 18 18 20   Temp: 98 F (36.7 C) 98.5 F (36.9 C) 98.4 F (36.9 C) 98.1 F (36.7 C)  TempSrc: Oral Oral Oral Oral  SpO2: 99% 98% 99% 99%  Weight:   125.5 kg   Height:        Intake/Output Summary (Last 24 hours) at 01/19/2020 1054 Last data filed at 01/19/2020 7824 Gross per 24 hour  Intake 2730.44 ml  Output 2425 ml  Net 305.44 ml   Filed Weights   01/17/20 0059 01/19/20 0507  Weight: 123 kg 125.5 kg    Examination:  General exam: Appears calm and comfortable  Respiratory system: Clear to auscultation. Respiratory effort normal. Cardiovascular system: S1 & S2 heard, RRR. No JVD, murmurs, rubs, gallops or clicks. No pedal edema. Gastrointestinal system: Abdomen is nondistended, soft and nontender. No organomegaly or masses felt. Normal bowel sounds heard. Central nervous system: Alert and oriented. No focal neurological deficits. Extremities: Right BKA Skin: Minimal erythema left lower extremity, significantly improved. Psychiatry: Judgement and insight appear poor. Mood & affect appropriate.        Data Reviewed: I have personally reviewed following labs and imaging  studies  CBC: Recent Labs  Lab 01/16/20 1821 01/17/20 0624 01/18/20 0640 01/19/20 0925  WBC 12.0* 15.1* 12.7* 9.4  NEUTROABS 10.7*  --  10.3* 7.4  HGB 12.2* 12.0* 10.0* 10.3*  HCT 37.9* 37.0* 31.1* 31.4*  MCV 96.4 95.1 94.8 95.2  PLT 163 151 122* 235   Basic Metabolic Panel: Recent Labs  Lab 01/16/20 1821 01/17/20 0624 01/18/20 0640 01/19/20 0925  NA 135 137 136 140  K 4.2 4.1 3.9 4.0  CL 102 102 104 105  CO2 22 24 24 23   GLUCOSE 239* 174* 145* 166*  BUN 56* 53* 49* 39*  CREATININE 2.80* 2.65* 2.16* 1.80*  CALCIUM 10.4* 10.0 9.4 9.5   GFR: Estimated Creatinine Clearance: 43.9 mL/min (A) (by C-G formula based on SCr of 1.8 mg/dL (H)). Liver Function Tests: Recent Labs  Lab 01/16/20 1821 01/17/20 0624  AST 29 26  ALT 23 20  ALKPHOS 66 61  BILITOT 0.8 0.9  PROT 7.0 6.8  ALBUMIN 3.7 3.2*   No results for input(s): LIPASE, AMYLASE in the last 168 hours. No results for input(s): AMMONIA in the last 168 hours. Coagulation Profile: Recent Labs  Lab 01/16/20 1821 01/17/20 0624  INR 1.1 1.1   Cardiac Enzymes: No results for input(s): CKTOTAL, CKMB, CKMBINDEX, TROPONINI in the last 168 hours. BNP (last 3 results) No results for input(s): PROBNP in the last 8760 hours. HbA1C: Recent Labs    01/17/20 0624  HGBA1C 6.8*   CBG: Recent Labs  Lab 01/18/20 0614 01/18/20 1159 01/18/20 1651 01/18/20 2119 01/19/20 0609  GLUCAP 142* 139* 150* 204* 145*   Lipid Profile: No results for input(s): CHOL, HDL, LDLCALC, TRIG, CHOLHDL, LDLDIRECT in the last 72 hours. Thyroid Function Tests: No results for input(s): TSH, T4TOTAL, FREET4, T3FREE, THYROIDAB in the last 72 hours. Anemia Panel: No results for input(s): VITAMINB12, FOLATE, FERRITIN, TIBC, IRON, RETICCTPCT in the last 72 hours. Sepsis Labs: Recent Labs  Lab 01/16/20 1821 01/16/20 1822 01/17/20 0624  PROCALCITON 7.73  --  15.96  LATICACIDVEN  --  1.9 1.4    Recent Results (from the past 240  hour(s))  Culture, blood (x 2)     Status: Abnormal   Collection Time: 01/16/20  6:21 PM   Specimen: BLOOD  Result Value Ref Range Status   Specimen Description BLOOD RIGHT ANTECUBITAL  Final   Special Requests   Final    BOTTLES DRAWN AEROBIC AND ANAEROBIC Blood Culture adequate volume   Culture  Setup Time   Final    GRAM NEGATIVE RODS AEROBIC BOTTLE ONLY Organism ID to follow CRITICAL RESULT CALLED TO, READ BACK BY AND VERIFIED WITH: Thelma Comp PharmD 16:00 01/27/20 ([email or web address]) GRAM POSITIVE COCCI GRAM NEGATIVE RODS ANAEROBIC BOTTLE ONLY CRITICAL RESULT CALLED TO, READ BACK BY AND VERIFIED WITH: K,PIERCE @1751  01/17/20 EB    Culture (A)  Final    PSEUDOMONAS AERUGINOSA STAPHYLOCOCCUS SPECIES (COAGULASE NEGATIVE) THE SIGNIFICANCE OF ISOLATING THIS ORGANISM FROM A SINGLE SET OF BLOOD CULTURES WHEN MULTIPLE SETS ARE DRAWN IS UNCERTAIN. PLEASE NOTIFY THE MICROBIOLOGY DEPARTMENT WITHIN ONE WEEK IF SPECIATION AND SENSITIVITIES ARE REQUIRED. Performed at [facility], [place] [address] [place], [place] [address]    Report Status 01/19/2020 FINAL  Final   Organism ID, Bacteria PSEUDOMONAS AERUGINOSA  Final      Susceptibility   Pseudomonas aeruginosa - MIC*    CEFTAZIDIME <=1 SENSITIVE Sensitive     CIPROFLOXACIN <=0.25 SENSITIVE Sensitive     GENTAMICIN <=1 SENSITIVE Sensitive     IMIPENEM 8 INTERMEDIATE Intermediate     PIP/TAZO 8 SENSITIVE Sensitive     CEFEPIME 2 SENSITIVE Sensitive     * PSEUDOMONAS AERUGINOSA  Blood Culture ID Panel (Reflexed)     Status: Abnormal   Collection Time: 01/16/20  6:21 PM  Result Value Ref Range Status   Enterococcus faecalis NOT DETECTED NOT DETECTED Final   Enterococcus Faecium NOT DETECTED NOT DETECTED Final   Listeria monocytogenes NOT DETECTED NOT DETECTED Final   Staphylococcus species DETECTED (A)  NOT DETECTED Final    Comment: RESULT CALLED TO, READ BACK BY AND VERIFIED WITH: K,PIERCE @1751  01/17/20 EB    Staphylococcus aureus (BCID)  NOT DETECTED NOT DETECTED Final   Staphylococcus epidermidis NOT DETECTED NOT DETECTED Final   Staphylococcus lugdunensis NOT DETECTED NOT DETECTED Final   Streptococcus species NOT DETECTED NOT DETECTED Final   Streptococcus agalactiae NOT DETECTED NOT DETECTED Final   Streptococcus pneumoniae NOT DETECTED NOT DETECTED Final   Streptococcus pyogenes NOT DETECTED NOT DETECTED Final   A.calcoaceticus-baumannii NOT DETECTED NOT DETECTED Final   Bacteroides fragilis NOT DETECTED NOT DETECTED Final   Enterobacterales NOT DETECTED NOT DETECTED Final   Enterobacter cloacae complex NOT DETECTED NOT DETECTED Final   Escherichia coli NOT DETECTED NOT DETECTED Final   Klebsiella aerogenes NOT DETECTED NOT DETECTED Final   Klebsiella oxytoca NOT DETECTED NOT DETECTED Final   Klebsiella pneumoniae NOT DETECTED NOT DETECTED Final   Proteus species NOT DETECTED NOT DETECTED Final   Salmonella species NOT DETECTED NOT DETECTED Final   Serratia marcescens NOT DETECTED NOT DETECTED Final   Haemophilus influenzae NOT DETECTED NOT DETECTED Final   Neisseria meningitidis NOT DETECTED NOT DETECTED Final   Pseudomonas aeruginosa DETECTED (A) NOT DETECTED Final    Comment: RESULT CALLED TO, READ BACK BY AND VERIFIED WITH: K,PIERCE @1751  01/17/20 EB    Stenotrophomonas maltophilia NOT DETECTED NOT DETECTED Final   Candida albicans NOT DETECTED NOT DETECTED Final   Candida auris NOT DETECTED NOT DETECTED Final   Candida glabrata NOT DETECTED NOT DETECTED Final   Candida krusei NOT DETECTED NOT DETECTED Final   Candida parapsilosis NOT DETECTED NOT DETECTED Final   Candida tropicalis NOT DETECTED NOT DETECTED Final   Cryptococcus neoformans/gattii NOT DETECTED NOT DETECTED Final   CTX-M ESBL NOT DETECTED NOT DETECTED Final   Carbapenem resistance IMP NOT DETECTED NOT DETECTED Final   Carbapenem resistance KPC NOT DETECTED NOT DETECTED Final   Carbapenem resistance NDM NOT DETECTED NOT DETECTED Final    Carbapenem resistance VIM NOT DETECTED NOT DETECTED Final    Comment: Performed at [facility], [address] [place], [place] [address]  Blood Culture ID Panel (Reflexed)     Status: Abnormal   Collection Time: 01/16/20  6:21 PM  Result Value Ref Range Status   Enterococcus faecalis NOT DETECTED NOT DETECTED Final   Enterococcus Faecium NOT DETECTED NOT DETECTED Final   Listeria monocytogenes NOT DETECTED NOT DETECTED Final   Staphylococcus species NOT DETECTED NOT DETECTED Final   Staphylococcus aureus (BCID) NOT DETECTED NOT DETECTED Final   Staphylococcus epidermidis NOT DETECTED NOT DETECTED Final   Staphylococcus lugdunensis NOT DETECTED NOT DETECTED Final   Streptococcus species NOT DETECTED NOT DETECTED Final   Streptococcus agalactiae NOT DETECTED NOT DETECTED Final   Streptococcus pneumoniae NOT DETECTED NOT DETECTED Final   Streptococcus pyogenes NOT DETECTED NOT DETECTED Final   A.calcoaceticus-baumannii NOT DETECTED NOT DETECTED Final   Bacteroides fragilis NOT DETECTED NOT DETECTED Final   Enterobacterales NOT DETECTED NOT DETECTED Final   Enterobacter cloacae complex NOT DETECTED NOT DETECTED Final   Escherichia coli NOT DETECTED NOT DETECTED Final   Klebsiella aerogenes NOT DETECTED NOT DETECTED Final   Klebsiella oxytoca NOT DETECTED NOT DETECTED Final   Klebsiella pneumoniae NOT DETECTED NOT DETECTED Final   Proteus species NOT DETECTED NOT DETECTED Final   Salmonella species NOT DETECTED NOT DETECTED Final   Serratia marcescens NOT DETECTED NOT DETECTED Final   Haemophilus influenzae NOT DETECTED NOT DETECTED  Final   Neisseria meningitidis NOT DETECTED NOT DETECTED Final   Pseudomonas aeruginosa DETECTED (A) NOT DETECTED Final    Comment: CRITICAL RESULT CALLED TO, READ BACK BY AND VERIFIED WITH: Thelma Comp PharmD 16:00 01/17/20 ([email or web address])    Stenotrophomonas maltophilia NOT DETECTED NOT DETECTED Final   Candida albicans NOT DETECTED NOT DETECTED Final    Candida auris NOT DETECTED NOT DETECTED Final   Candida glabrata NOT DETECTED NOT DETECTED Final   Candida krusei NOT DETECTED NOT DETECTED Final   Candida parapsilosis NOT DETECTED NOT DETECTED Final   Candida tropicalis NOT DETECTED NOT DETECTED Final   Cryptococcus neoformans/gattii NOT DETECTED NOT DETECTED Final   CTX-M ESBL NOT DETECTED NOT DETECTED Final   Carbapenem resistance IMP NOT DETECTED NOT DETECTED Final   Carbapenem resistance KPC NOT DETECTED NOT DETECTED Final   Carbapenem resistance NDM NOT DETECTED NOT DETECTED Final   Carbapenem resistance VIM NOT DETECTED NOT DETECTED Final    Comment: Performed at [facility], [place] [address] [place], [place] [address]  SARS Coronavirus 2 by RT PCR (hospital order, performed in [facility] hospital lab) Nasopharyngeal Nasopharyngeal Swab     Status: None   Collection Time: 01/16/20  6:23 PM   Specimen: Nasopharyngeal Swab  Result Value Ref Range Status   SARS Coronavirus 2 NEGATIVE NEGATIVE Final    Comment: (NOTE) SARS-CoV-2 target nucleic acids are NOT DETECTED.  The SARS-CoV-2 RNA is generally detectable in upper and lower respiratory specimens during the acute phase of infection. The lowest concentration of SARS-CoV-2 viral copies this assay can detect is 250 copies / mL. A negative result does not preclude SARS-CoV-2 infection and should not be used as the sole basis for treatment or other patient management decisions.  A negative result may occur with improper specimen collection / handling, submission of specimen other than nasopharyngeal swab, presence of viral mutation(s) within the areas targeted by this assay, and inadequate number of viral copies (<250 copies / mL). A negative result must be combined with clinical observations, patient history, and epidemiological information.  Fact Sheet for Patients:   StrictlyIdeas.no  Fact Sheet for Healthcare  Providers: BankingDealers.co.za  This test is not yet approved or  cleared by the [place] FDA and has been authorized for detection and/or diagnosis of SARS-CoV-2 by FDA under an Emergency Use Authorization (EUA).  This EUA will remain in effect (meaning this test can be used) for the duration of the COVID-19 declaration under Section 564(b)(1) of the Act, 21 U.S.C. section 360bbb-3(b)(1), unless the authorization is terminated or revoked sooner.  Performed at [facility], [place] [address] [place], [place] [address]   Urine culture     Status: None   Collection Time: 01/17/20  5:01 AM   Specimen: Urine, Catheterized  Result Value Ref Range Status   Specimen Description URINE, CATHETERIZED  Final   Special Requests NONE  Final   Culture   Final    NO GROWTH Performed at [place] [address] [place], [place] [address]    Report Status 01/18/2020 FINAL  Final  Culture, blood (Routine X 2) w Reflex to ID Panel     Status: None (Preliminary result)   Collection Time: 01/17/20  6:32 AM   Specimen: BLOOD  Result Value Ref Range Status   Specimen Description BLOOD LEFT ANTECUBITAL  Final   Special Requests   Final    BOTTLES DRAWN AEROBIC AND ANAEROBIC Blood Culture results may not be  optimal due to an excessive volume of blood received in culture bottles   Culture   Final    NO GROWTH 2 DAYS Performed at [facility], [place] [address] [place], [place] [address]    Report Status PENDING  Incomplete      Radiology Studies: No results found.  Scheduled Meds: . allopurinol  100 mg Oral BID  . aspirin  162 mg Oral Daily  . bisacodyl  10 mg Rectal Once  . colchicine  0.6 mg Oral Daily  . diltiazem  300 mg Oral Daily  . dorzolamide  1 drop Both Eyes BID  . enoxaparin (LOVENOX) injection  40 mg Subcutaneous QHS  . ferrous sulfate  325 mg Oral Q breakfast  . furosemide  40 mg Oral Daily  . gabapentin  100 mg Oral TID  .  insulin aspart  0-15 Units Subcutaneous TID WC  . insulin aspart  0-5 Units Subcutaneous QHS  . insulin glargine  10 Units Subcutaneous QHS  . ketotifen  1 drop Both Eyes BID  . latanoprost  1 drop Both Eyes QHS  . niacin  1,000 mg Oral QHS  . omega-3 acid ethyl esters  1 g Oral BID  . pantoprazole  40 mg Oral Daily  . [START ON 01/20/2020] rosuvastatin  10 mg Oral Q7 days   Continuous Infusions: . sodium chloride 1,000 mL (01/17/20 1655)  . ceFEPime (MAXIPIME) IV 2 g (01/18/20 2156)     LOS: 3 days   Time spent: 28 minutes   Darliss Cheney, MD [facility]  01/19/2020, 10:54 AM   To contact the attending provider between 7A-7P or the covering provider during after hours 7P-7A, please log into the web site [email or web address].

## 2020-01-19 NOTE — Evaluation (Signed)
Physical Therapy Evaluation Patient Details Name: Micheal Walls MRN: [id number] DOB: 1935/12/09 Today's Date: 01/19/2020   History of Present Illness  Patient is a 84 y/o male who presents with  AMS, confusion, lethargy. Found to have sepsis secondary to LLE cellulitis and gram-positive and gram-negative bacteremia. PMH includes chronic diastolic CHF. DM, CKD, gout, HLD, OSA on CPAP, Right BKA.  Clinical Impression  Patient presents with generalized weakness, impaired cognition and impaired mobility s/p above. Pt lives at home with wife and grandson and reports being Mod I for transfers and a w/c level PTA. Does his own ADLs and reports he works with PT/OT and has been initiating gait at home. Today, pt with heavy reliance on bed rails and elevated HOB to get to EOB and Min A for lateral scoot transfers towards right per pt request. Increased time and difficulty due to weakness and with perfect circumstances- downhill using bed. Recommend maxi move to transfer back to bed, RN aware. Pt adamant about wanting to return home- "when I don't have these lines, I will be fine." Would benefit from SNF to maximize independence and mobility prior to return home. Will follow acutely.     Follow Up Recommendations SNF;Supervision for mobility/OOB;Supervision/Assistance - 24 hour    Equipment Recommendations  None recommended by PT    Recommendations for Other Services       Precautions / Restrictions Precautions Precautions: Fall Precaution Comments: prosthesis in room Restrictions Weight Bearing Restrictions: No      Mobility  Bed Mobility Overal bed mobility: Needs Assistance Bed Mobility: Rolling;Sidelying to Sit Rolling: Min guard Sidelying to sit: HOB elevated;Min guard       General bed mobility comments: Increased time and heavy use of rail to get to EOB.  Transfers Overall transfer level: Needs assistance Equipment used: None Transfers: Lateral/Scoot Transfers           Lateral/Scoot Transfers: Min assist;From elevated surface General transfer comment: Increased time, performed from elevated bed height and towards right per pt request. Difficulty scooting due to weakness and pain in left hand from IV.  Ambulation/Gait             General Gait Details: Unable  Stairs            Wheelchair Mobility    Modified Rankin (Stroke Patients Only)       Balance Overall balance assessment: Needs assistance Sitting-balance support: Feet supported;Single extremity supported (foot supported) Sitting balance-Leahy Scale: Good Sitting balance - Comments: Able to donn prosthesis with increased time and some difficulty, needs Min A. "it does not want to cooperate today."                                     Pertinent Vitals/Pain Pain Assessment: No/denies pain    Home Living Family/patient expects to be discharged to:: Private residence Living Arrangements:  (grandson) Available Help at Discharge: Family;Available PRN/intermittently Type of Home: House Home Access: Ramped entrance     Home Layout: One level Home Equipment: Wheelchair - Insurance claims handler - 2 wheels      Prior Function Level of Independence: Independent with assistive device(s)         Comments: Independent at wheelchair level. Dons RLE prosthesis for transfers. Reports hasn't ambulated since 2002. Mod I ADLs (per pt).  Does cooking. Has been getting HHPT/OT and reports has started gait training again.     Hand Dominance  Dominant Hand: Right    Extremity/Trunk Assessment   Upper Extremity Assessment Upper Extremity Assessment: Defer to OT evaluation    Lower Extremity Assessment Lower Extremity Assessment: RLE deficits/detail;Generalized weakness;LLE deficits/detail RLE Deficits / Details: BKA. LLE Deficits / Details: erythema, redness and swelling distal LLE. LLE Sensation: decreased light touch       Communication   Communication:  HOH  Cognition Arousal/Alertness: Awake/alert Behavior During Therapy: WFL for tasks assessed/performed Overall Cognitive Status: No family/caregiver present to determine baseline cognitive functioning Area of Impairment: Safety/judgement                         Safety/Judgement: Decreased awareness of deficits;Decreased awareness of safety     General Comments: poor awareness of deficits/weakness. "without these lines, i will be good at home" with regards to moving around. HOH.      General Comments      Exercises     Assessment/Plan    PT Assessment Patient needs continued PT services  PT Problem List Decreased strength;Decreased mobility;Decreased safety awareness;Obesity;Decreased skin integrity;Decreased cognition       PT Treatment Interventions Therapeutic activities;Therapeutic exercise;Patient/family education;Wheelchair mobility training;Functional mobility training    PT Goals (Current goals can be found in the Care Plan section)  Acute Rehab PT Goals Patient Stated Goal: to go home tomorrow PT Goal Formulation: With patient Time For Goal Achievement: 02/02/20 Potential to Achieve Goals: Fair    Frequency Min 3X/week   Barriers to discharge Decreased caregiver support      Co-evaluation               AM-PAC PT "6 Clicks" Mobility  Outcome Measure Help needed turning from your back to your side while in a flat bed without using bedrails?: A Little Help needed moving from lying on your back to sitting on the side of a flat bed without using bedrails?: A Little Help needed moving to and from a bed to a chair (including a wheelchair)?: A Little Help needed standing up from a chair using your arms (e.g., wheelchair or bedside chair)?: Total Help needed to walk in hospital room?: Total Help needed climbing 3-5 steps with a railing? : Total 6 Click Score: 12    End of Session   Activity Tolerance: Patient tolerated treatment well Patient  left: in chair;with call bell/phone within reach Nurse Communication: Mobility status;Need for lift equipment PT Visit Diagnosis: Muscle weakness (generalized) (M62.81)    Time: 1020-1049 PT Time Calculation (min) (ACUTE ONLY): 29 min   Charges:   PT Evaluation $PT Eval Moderate Complexity: 1 Mod PT Treatments $Therapeutic Activity: 8-22 mins        Marisa Severin, PT, DPT Acute Rehabilitation Services Pager [phone] Office [place] 01/19/2020, 10:57 AM

## 2020-01-19 NOTE — TOC Initial Note (Addendum)
Transition of Care [facility]) - Initial/Assessment Note    Patient Details  Name: Micheal Walls MRN: [id number] Date of Birth: 17-Aug-1935  Transition of Care [facility]) CM/SW Contact:    Norina Buzzard, RN Phone Number: 01/19/2020, 4:38 PM  Clinical Narrative:  Pt admitted with AMS, confusion, lethargy. Found to have sepsis secondary to LLE cellulitis and gram-positive and gram-negative bacteremia. PMH includes chronic diastolic CHF, DM, CKD, gout, HLD, OSA on CPAP, and R BKA.  PT is recommending SNF vs HH.  Met with pt. He is alert and oriented x 3. Discussed PT recommendations. He declined SNF. He reports that he lives with his wife and grandson and he has good family support. He has W/C, electrical W/C, elevated toilet set, and a RW (old walker from 2002). He reports that he needs a RW. Contacted Keon at [employer] for DME referral. Bertrum Sol reports that pt declined the RW because he wants a  heavy duty, but he doesn't qualify for a heavy duty RW due to his weight. His weight has to be at least 300 lbs.  He reports that he is currently receiving [place] services (RN, PT, OT) with [facility] Contacted Drew with [facility] and he confirmed that pt is active with them for nursing, PT and OT.  Expected Discharge Plan: [place] Barriers to Discharge: No Barriers Identified   Patient Goals and CMS Choice        Expected Discharge Plan and Services Expected Discharge Plan: [place]   Discharge Planning Services: CM Consult   Living arrangements for the past 2 months: Single Family Home                 DME Arranged: Walker rolling DME Agency: [employer] Date DME Agency Contacted: 01/19/20 Time DME Agency Contacted: [phone] Representative spoke with at DME Agency: [place]            Prior Living Arrangements/Services Living arrangements for the past 2 months: [place] with:: Spouse, Other (Comment) Patient  language and need for interpreter reviewed:: Yes Do you feel safe going back to the place where you live?: Yes          Current home services: Home OT, Home PT, Home RN    Activities of Daily Living Home Assistive Devices/Equipment: Prosthesis ADL Screening (condition at time of admission) Patient's cognitive ability adequate to safely complete daily activities?: Yes Is the patient deaf or have difficulty hearing?: Yes Does the patient have difficulty seeing, even when wearing glasses/contacts?: No Does the patient have difficulty concentrating, remembering, or making decisions?: No Patient able to express need for assistance with ADLs?: Yes Does the patient have difficulty dressing or bathing?: No Independently performs ADLs?: Yes (appropriate for developmental age) Does the patient have difficulty walking or climbing stairs?: Yes Weakness of Legs: Both Weakness of Arms/Hands: Both  Permission Sought/Granted Permission sought to share information with : Case Manager                Emotional Assessment Appearance:: Appears stated age, Well-Groomed Attitude/Demeanor/Rapport: Self-Confident, Engaged Affect (typically observed): Pleasant, Calm, Appropriate Orientation: : Oriented to Self, Oriented to Place, Oriented to  Time, Oriented to Situation      Admission diagnosis:  Toe ulcer ([place]) [L97.509] Sepsis ([place]) [A41.9] Patient Active Problem List   Diagnosis Date Noted  . Acute metabolic encephalopathy 48/88/9169  . Diabetic ulcer of left great toe ([place]) 01/16/2020  . Controlled type 2 diabetes mellitus  with stable proliferative retinopathy of both eyes, with long-term current use of insulin ([place]) 01/06/2020  . Intermediate stage nonexudative age-related macular degeneration of both eyes 01/06/2020  . Left epiretinal membrane 01/06/2020  . [place] of right macula 01/06/2020  . Aspiration pneumonia ([place]) 11/02/2019  . Sepsis ([place]) 11/02/2019  . AKI (acute kidney injury)  ([place]) 11/02/2019  . Cellulitis 11/02/2019  . Generalized weakness 11/02/2019  . Elevated TSH 07/10/2018  . Nephropathy 02/28/2018  . Disturbance of skin sensation 02/28/2018  . CKD (chronic kidney disease) stage 4, GFR 15-29 ml/min (HCC) 07/17/2017  . PAD (peripheral artery disease) ([place]) 07/17/2017  . 110.1 10/21/2013  . Epiphora 04/24/2013  . Laxity of eyelid 04/24/2013  . Anemia 04/09/2013  . History of peripheral vascular disease 04/09/2013  . History of stroke 04/09/2013  . S/P unilateral BKA (below knee amputation) ([place]) 03/17/2013  . Diabetes mellitus type 2 in obese ([place]) 03/17/2013  . Mixed hyperlipidemia 03/17/2013  . Essential hypertension 03/17/2013  . Gout 03/17/2013  . OSA on CPAP 03/17/2013  . Morbid obesity ([place]) 03/17/2013  . Anemia, iron deficiency 03/17/2013  . Diverticulosis of colon 03/17/2013  . RBBB 03/17/2013  . Punctal stenosis, acquired 10/02/2012   PCP:  [name], [facility]:   [place], [place] - [address] [place] [address] Phone: [phone] Fax: [phone]  [place] ([place], [place] [address] Phone: [phone] Fax: [phone]  [id number] [place] [address]Phone: [phone]: [phone]    Social Determinants of Health (SDOH) Interventions    Readmission Risk Interventions No flowsheet data found.

## 2020-01-19 NOTE — Evaluation (Signed)
Occupational Therapy Evaluation Patient Details Name: Micheal Walls MRN: [id number] DOB: Nov 06, 1935 Today's Date: 01/19/2020    History of Present Illness Patient is a 84 y/o male who presents with  AMS, confusion, lethargy. Found to have sepsis secondary to LLE cellulitis and gram-positive and gram-negative bacteremia. PMH includes chronic diastolic CHF. DM, CKD, gout, HLD, OSA on CPAP, Right BKA.   Clinical Impression   Pt admitted with above. He demonstrates the below listed deficits and will benefit from continued OT to maximize safety and independence with BADLs.  Pt presents to OT with generalized weakness, decreased activity tolerance, decreased safety awareness.  He requires mod A, overall for ADLs.  He reports he lives with his wife, and adult grandson, and that he is mod I with ADLs at home at w/c level.  IV location in bil. UEs impedes functional use of his arms to assist with transfers and ADLs.  Anticipate he will require SNF level rehab, although he may refuse..       Follow Up Recommendations  SNF;Supervision/Assistance - 24 hour    Equipment Recommendations  None recommended by OT    Recommendations for Other Services       Precautions / Restrictions Precautions Precautions: Fall Precaution Comments: prosthesis in room Restrictions Weight Bearing Restrictions: No      Mobility Bed Mobility Overal bed mobility: Needs Assistance Bed Mobility: Rolling;Sidelying to Sit Rolling: Min guard Sidelying to sit: HOB elevated;Min guard       General bed mobility comments: pt sititng in chair   Transfers Overall transfer level: Needs assistance Equipment used: None Transfers: Lateral/Scoot Transfers          Lateral/Scoot Transfers: Min assist;From elevated surface General transfer comment: attempted to move sit to partial stand, but unable to do so as pt unable tolerate pushing through UEs due to IV placement LT hand limiting wrist extension    Balance  Overall balance assessment: Needs assistance Sitting-balance support: Feet supported;Single extremity supported Sitting balance-Leahy Scale: Good Sitting balance - Comments: Able to donn prosthesis with increased time and some difficulty, needs Min A. "it does not want to cooperate today."                                   ADL either performed or assessed with clinical judgement   ADL Overall ADL's : Needs assistance/impaired Eating/Feeding: Independent   Grooming: Wash/dry hands;Wash/dry face;Oral care;Brushing hair;Set up;Sitting   Upper Body Bathing: Set up;Sitting   Lower Body Bathing: Sitting/lateral leans;Bed level;Moderate assistance   Upper Body Dressing : Minimal assistance;Sitting   Lower Body Dressing: Moderate assistance;Sitting/lateral leans Lower Body Dressing Details (indicate cue type and reason): Pt able to don/doff sock in sitting  Toilet Transfer: Moderate assistance;+2 for safety/equipment;Squat-pivot (scoot )   Toileting- Clothing Manipulation and Hygiene: Maximal assistance;Sitting/lateral lean       Functional mobility during ADLs: Moderate assistance;+2 for safety/equipment General ADL Comments: Pt limited due to edema, weakness, and IV placement bil. UEs which limits his ability to push through UEs to assist with transferws      Vision Baseline Vision/History: Wears glasses Wears Glasses: At all times Patient Visual Report: No change from baseline Vision Assessment?: No apparent visual deficits     Perception     Praxis      Pertinent Vitals/Pain Pain Assessment: No/denies pain     Hand Dominance Right   Extremity/Trunk Assessment Upper Extremity Assessment Upper Extremity Assessment:  Generalized weakness   Lower Extremity Assessment Lower Extremity Assessment: Defer to PT evaluation RLE Deficits / Details: BKA. LLE Deficits / Details: erythema, redness and swelling distal LLE. LLE Sensation: decreased light touch        Communication Communication Communication: HOH   Cognition Arousal/Alertness: Awake/alert Behavior During Therapy: WFL for tasks assessed/performed Overall Cognitive Status: No family/caregiver present to determine baseline cognitive functioning Area of Impairment: Safety/judgement                         Safety/Judgement: Decreased awareness of deficits;Decreased awareness of safety     General Comments: poor awareness of deficits/weakness. "without these lines, i will be good at home" with regards to moving around. HOH.   General Comments       Exercises     Shoulder Instructions      Home Living Family/patient expects to be discharged to:: Private residence Living Arrangements: Spouse/significant other;Children;Other (Comment) (grandson ) Available Help at Discharge: Family;Available PRN/intermittently Type of Home: House Home Access: Ramped entrance     Home Layout: One level     Bathroom Shower/Tub: Producer, television/film/video: Handicapped height Bathroom Accessibility: Yes   Home Equipment: Wheelchair - Economist - 2 wheels          Prior Functioning/Environment Level of Independence: Independent with assistive device(s)        Comments: Independent at wheelchair level. Dons RLE prosthesis for transfers. Reports hasn't ambulated since 2002. Mod I ADLs (per pt).  Does cooking. Has been getting HHPT/OT and reports has started gait training again.        OT Problem List: Decreased strength;Decreased activity tolerance;Impaired balance (sitting and/or standing);Decreased cognition;Decreased safety awareness;Decreased knowledge of use of DME or AE;Impaired UE functional use;Obesity;Pain      OT Treatment/Interventions: Self-care/ADL training;DME and/or AE instruction;Therapeutic activities;Cognitive remediation/compensation;Patient/family education;Balance training    OT Goals(Current goals can be found in the care plan  section) Acute Rehab OT Goals Patient Stated Goal: to go home  OT Goal Formulation: With patient Time For Goal Achievement: 02/02/20 Potential to Achieve Goals: Good ADL Goals Pt Will Perform Lower Body Bathing: (P) with min assist;sitting/lateral leans Pt Will Perform Lower Body Dressing: (P) with min assist;sitting/lateral leans Pt Will Transfer to Toilet: (P) with min assist;squat pivot transfer;bedside commode;grab bars Pt Will Perform Toileting - Clothing Manipulation and hygiene: (P) with min assist;sitting/lateral leans  OT Frequency: Min 2X/week   Barriers to D/C:            Co-evaluation              AM-PAC OT "6 Clicks" Daily Activity     Outcome Measure Help from another person eating meals?: None Help from another person taking care of personal grooming?: A Little Help from another person toileting, which includes using toliet, bedpan, or urinal?: A Lot Help from another person bathing (including washing, rinsing, drying)?: A Lot Help from another person to put on and taking off regular upper body clothing?: A Little Help from another person to put on and taking off regular lower body clothing?: A Lot 6 Click Score: 16   End of Session    Activity Tolerance: Patient tolerated treatment well Patient left: in chair;with call bell/phone within reach  OT Visit Diagnosis: Unsteadiness on feet (R26.81);Muscle weakness (generalized) (M62.81)                Time: 1499-6924 OT Time Calculation (min): 11 min Charges:  OT General Charges $OT Visit: 1 Visit  Nilsa Nutting., OTR/L Acute Rehabilitation Services Pager [phone] Office [phone]   Lucille Passy M 01/19/2020, 12:13 PM

## 2020-01-20 ENCOUNTER — Other Ambulatory Visit (HOSPITAL_COMMUNITY): Payer: Medicare Other

## 2020-01-20 ENCOUNTER — Encounter: Payer: Self-pay | Admitting: Nurse Practitioner

## 2020-01-20 LAB — GLUCOSE, CAPILLARY: Glucose-Capillary: 170 mg/dL — ABNORMAL HIGH (ref 70–99)

## 2020-01-20 MED ORDER — LEVOFLOXACIN 750 MG PO TABS
750.0000 mg | ORAL_TABLET | ORAL | 0 refills | Status: AC
Start: 2020-01-20 — End: 2020-01-27

## 2020-01-20 NOTE — Progress Notes (Signed)
OT Cancellation Note  Patient Details Name: Micheal Walls MRN: [id number] DOB: 1936-02-14   Cancelled Treatment:    Reason Eval/Treat Not Completed: Other (comment) (Patient d/c'd home). Attempted to see patient but upon entry to room, patient was dressed and getting into transport chair in prep for d/c.   Gloris Manchester OTR/L Supplemental OT, Department of rehab services [phone]  Kathleen Likins R H. 01/20/2020, 10:37 AM

## 2020-01-20 NOTE — Care Management Important Message (Signed)
Important Message  Patient Details  Name: NOVAH GOZA MRN: [id number] Date of Birth: 1936-06-07   Medicare Important Message Given:  Yes     Shelda Altes 01/20/2020, 9:59 AM

## 2020-01-20 NOTE — Discharge Instructions (Signed)
Sepsis, Diagnosis, Adult Sepsis is a serious bodily reaction to an infection. The infection that triggers sepsis may be from a bacteria, virus, or fungus. Sepsis can result from an infection in any part of your body. Infections that commonly lead to sepsis include skin, lung, and urinary tract infections. Sepsis is a medical emergency that must be treated right away in a hospital. In severe cases, it can lead to septic shock. Septic shock can weaken your heart and cause your blood pressure to drop. This can cause your central nervous system and your body's organs to stop working. What are the causes? This condition is caused by a severe reaction to infections from bacteria, viruses, or fungus. The germs that most often lead to sepsis include:  Escherichia coli (E. coli) bacteria.  Staphylococcus aureus (staph) bacteria.  Some types of Streptococcus bacteria. The most common infections affect these organs:  The lung (pneumonia).  The kidneys or bladder (urinary tract infection).  The skin (cellulitis).  The bowel, gallbladder, or pancreas. What increases the risk? You are more likely to develop this condition if:  Your body's disease-fighting system (immune system) is weakened.  You are age 92 or older.  You are male.  You had surgery or you have been hospitalized.  You have these devices inserted into your body: ? A small, thin tube (catheter). ? IV line. ? Breathing tube. ? Drainage tube.  You are not getting enough nutrients from food (malnourished).  You have a long-term (chronic) disease, such as cancer, lung disease, kidney disease, or diabetes.  You are African American. What are the signs or symptoms? Symptoms of this condition may include:  Fever.  Chills or feeling very cold.  Confusion or anxiety.  Fatigue.  Muscle aches.  Shortness of breath.  Nausea and vomiting.  Urinating much less than usual.  Fast heart rate (tachycardia).  Rapid  breathing (hyperventilation).  Changes in skin color. Your skin may look blotchy, pale, or blue.  Cool, clammy, or sweaty skin.  Skin rash. Other symptoms depend on the source of your infection. How is this diagnosed? This condition is diagnosed based on:  Your symptoms.  Your medical history.  A physical exam. Other tests may also be done to find out the cause of the infection and how severe the sepsis is. These tests may include:  Blood tests.  Urine tests.  Swabs from other areas of your body that may have an infection. These samples may be tested (cultured) to find out what type of bacteria is causing the infection.  Chest X-ray to check for pneumonia. Other imaging tests, such as a CT scan, may also be done.  Lumbar puncture. This removes a small amount of the fluid that surrounds your brain and spinal cord. The fluid is then examined for infection. How is this treated? This condition must be treated in a hospital. Based on the cause of your infection, you may be given an antibiotic, antiviral, or antifungal medicine. You may also receive:  Fluids through an IV.  Oxygen and breathing assistance.  Medicines to increase your blood pressure.  Kidney dialysis. This process cleans your blood if your kidneys have failed.  Surgery to remove infected tissue.  Blood transfusion if needed.  Medicine to prevent blood clots.  Nutrients to correct imbalances in basic body function (metabolism). You may: ? Receive important salts and minerals (electrolytes) through an IV. ? Have your blood sugar level adjusted. Follow these instructions at home: Medicines   Take over-the-counter and  prescription medicines only as told by your health care provider.  If you were prescribed an antibiotic, antiviral, or antifungal medicine, take it as told by your health care provider. Do not stop taking the medicine even if you start to feel better. General instructions  If you have a  catheter or other indwelling device, ask to have it removed as soon as possible.  Keep all follow-up visits as told by your health care provider. This is important. Contact a health care provider if:  You do not feel like you are getting better or regaining strength.  You are having trouble coping with your recovery.  You frequently feel tired.  You feel worse or do not seem to get better after surgery.  You think you may have an infection after surgery. Get help right away if:  You have any symptoms of sepsis.  You have difficulty breathing.  You have a rapid or skipping heartbeat.  You become confused or disoriented.  You have a high fever.  Your skin becomes blotchy, pale, or blue.  You have an infection that is getting worse or not getting better. These symptoms may represent a serious problem that is an emergency. Do not wait to see if the symptoms will go away. Get medical help right away. Call your local emergency services (911 in the [place] Do not drive yourself to the hospital. Summary  Sepsis is a medical emergency that requires immediate treatment in a hospital.  This condition is caused by a severe reaction to infections from bacteria, viruses, or fungus.  Based on the cause of your infection, you may be given an antibiotic, antiviral, or antifungal medicine.  Treatment may also include IV fluids, breathing assistance, and kidney dialysis. This information is not intended to replace advice given to you by your health care provider. Make sure you discuss any questions you have with your health care provider. Document Revised: 01/05/2018 Document Reviewed: 01/05/2018 [employer] Patient Education  [place].

## 2020-01-20 NOTE — Progress Notes (Signed)
D/C instructions given and reviewed. No questions voiced at this time. Encourage to call for any questions. Tele and IV's removed. Tolerated well. Calling family for transport at this time.

## 2020-01-20 NOTE — Discharge Summary (Signed)
Physician Discharge Summary  DAHLTON HINDE EKC:[id number] DOB: 01/13/1936 DOA: 01/16/2020  PCP: [name], [name]  Admit date: 01/16/2020 Discharge date: 01/20/2020  Admitted From: Home Disposition: Home  Recommendations for Outpatient Follow-up:  1. Follow up with PCP in 1-2 weeks 2. Please obtain BMP/CBC in one week 3. Please follow up with your PCP on the following pending results: Unresulted Labs (From admission, onward) Comment          Start     Ordered   01/21/20 0500  Creatinine, serum  Tomorrow morning,   R       Question:  Specimen collection method  Answer:  Lab=Lab collect   01/20/20 0847           Home Health: Yes Equipment/Devices: Walker  Discharge Condition: Stable CODE STATUS: Full code Diet recommendation: Cardiac  Subjective: Seen and examined.  No complaints.  Brief/Interim Summary: Warnell Rasnic Huntleyis a [age] y.o.malewith medical history significant ofDM2, HTN, gout, R BKA who presented to ED with 1 day h/o AMS, confusion, lethargy. Unclear when exact onset was though presumably with in past day or two as nothing of this was mentioned on 8/3 at podiatry office visit. Has chronic swelling in LLE. No dysuria, occasional cough, no SOB, no CP, no headache nor meningismus. No abd pain, no N/V/D.  Upon arrival to ED his temperature was 103.1, heart rate 106 and white blood cells 12.  Lactic acid was normal.  Creatinine was 2.8.  Blood pressure was normal.  Foot x-ray was negative for any osteomyelitis. Patient was diagnosed with sepsis initially presumed to be due to chronic left foot ulcer however later, next morning when I saw this patient for the first time, he was diagnosed with left lower extremity cellulitis.  Broad-spectrum antibiotics were continued.  Patient quickly defervesced after starting antibiotics.  He remained afebrile for the rest of his hospitalization except presenting febrile initially.  Patient then grew Pseudomonas in his blood culture as  well as coagulase-negative gram-positive cocci which was likely contaminant.  Patient CKD and blood pressure as well as diabetes remained under control.  He was continued on cefepime.  recent blood culture drawn on 01/17/2020 have remained negative thus far.  Pseudomonas was mostly sensitive and based on that he is going to be discharged on Levaquin 750 mg p.o. every 48 hours based on his renal function for total 4 doses and that will he will complete at least 10 days of total antibiotic therapy.  He is being discharged in stable condition.  Discharge Diagnoses:  Principal Problem:   Sepsis ([place]) Active Problems:   Essential hypertension   CKD (chronic kidney disease) stage 4, GFR 15-29 ml/min (HCC)   Controlled type 2 diabetes mellitus with stable proliferative retinopathy of both eyes, with long-term current use of insulin (HCC)   Acute metabolic encephalopathy   Diabetic ulcer of left great toe [facility])    Discharge Instructions   Allergies as of 01/20/2020   No Known Allergies     Medication List    TAKE these medications   Alaway 0.025 % ophthalmic solution Generic drug: ketotifen Place 1 drop into both eyes 2 (two) times daily.   allopurinol 100 MG tablet Commonly known as: ZYLOPRIM Take 1 tablet (100 mg total) by mouth daily. What changed: when to take this   antiseptic oral rinse Liqd 15 mLs by Mouth Rinse route as needed for dry mouth.   aspirin 81 MG tablet Take 162 mg by mouth daily.   [name]  Paint Modified 1.5 % Liqd Use a cotton tip applicator. Paint between toes of left foot as needed once daily. What changed:   when to take this  additional instructions   colchicine 0.6 MG tablet Take 0.6 mg by mouth daily.   diltiazem 300 MG 24 hr capsule Commonly known as: CARDIZEM CD TAKE ONE CAPSULE EACH DAY What changed: See the new instructions.   dorzolamide 2 % ophthalmic solution Commonly known as: TRUSOPT Place 1 drop into both eyes 2 (two) times daily.    ferrous sulfate 325 (65 FE) MG tablet Take 325 mg by mouth daily with breakfast.   fish oil-omega-3 fatty acids 1000 MG capsule Take 1 g by mouth 2 (two) times daily.   FreeStyle Precision Neo Test test strip Generic drug: glucose blood Use as instructed   furosemide 40 MG tablet Commonly known as: LASIX Take 1 tablet (40 mg total) by mouth daily. What changed: when to take this   gabapentin 100 MG capsule Commonly known as: NEURONTIN Take 1 capsule  by mouth 3  times daily.   hydrocortisone 25 MG suppository Commonly known as: ANUSOL-HC Place 25 mg rectally 2 (two) times daily as needed for hemorrhoids or anal itching.   latanoprost 0.005 % ophthalmic solution Commonly known as: XALATAN Place 1 drop into both eyes at bedtime.   levofloxacin 750 MG tablet Commonly known as: Levaquin Take 1 tablet (750 mg total) by mouth every other day for 4 doses.   losartan 100 MG tablet Commonly known as: COZAAR Take 100 mg by mouth at bedtime.   multivitamin tablet Take 1 tablet by mouth daily.   niacin 500 MG CR tablet Commonly known as: NIASPAN TAKE 2 TABLETS AT BEDTIME   Ozempic ([name]) 2 MG/1.5ML Sopn Generic drug: Semaglutide ([name]) Inject 1 mg into the skin once a week.   pantoprazole 40 MG tablet Commonly known as: PROTONIX Take 1 tablet  by mouth daily.   Pramosone cream Generic drug: pramoxine-hydrocortisone Apply topically 3 (three) times daily. Apply topically to hemorrhoids four times daily as needed after bowel movements, for external use only   PRESCRIPTION MEDICATION See admin instructions. CPAP- At bedtime   REFRESH DRY EYE THERAPY OP Place 1 drop into both eyes in the morning and at bedtime.   rosuvastatin 10 MG tablet Commonly known as: CRESTOR Take 1 tablet (10 mg total) by mouth daily. What changed: when to take this   TRESIBA FLEXTOUCH [place] Inject 10 mg into the skin at bedtime.            Durable Medical Equipment  (From  admission, onward)         Start     Ordered   01/19/20 1550  For home use only DME Walker rolling  Once       Question Answer Comment  Walker: With 5 Inch Wheels   Patient needs a walker to treat with the following condition Balance problem      01/19/20 1549          Follow-up Information    [name], [name].   Specialty: General Practice Why: The office will call patient Contact information: [address] [place] [address] [phone]              No Known Allergies  Consultations: None   Procedures/Studies: DG Chest Port 1 View  Result Date: 01/16/2020 CLINICAL DATA:  Altered mental status. EXAM: PORTABLE CHEST 1 VIEW COMPARISON:  December 17, 2019 FINDINGS:  There is no evidence of acute infiltrate, pleural effusion or pneumothorax. The heart size and mediastinal contours are within normal limits. There is mild to moderate severity calcification of the aortic arch. The visualized skeletal structures are unremarkable. IMPRESSION: No active disease. Electronically Signed   By: Virgina Norfolk M.D.   On: 01/16/2020 18:52   DG Foot 2 Views Left  Result Date: 01/16/2020 CLINICAL DATA:  Left foot swelling and ulcers. EXAM: LEFT FOOT - 2 VIEW COMPARISON:  Foot radiograph 11/02/2019 FINDINGS: Prominent dorsal soft tissue edema that is progressed from prior exam. No visualized deep soft tissue ulcer. No bony destruction or periosteal reaction to suggest osteomyelitis. Unchanged mid and hindfoot osteoarthritis and plantar calcaneal spur. No osseous erosions. Advanced vascular calcifications. IMPRESSION: 1. Prominent dorsal soft tissue edema that has progressed from radiographs and may. No visualized deep soft tissue ulcer. No evidence of osteomyelitis. 2. Unchanged mid and hindfoot osteoarthritis. 3. Advanced vascular calcifications. Electronically Signed   By: Keith Rake M.D.   On: 01/16/2020 21:17   OCT, Retina - OU - Both Eyes  Result Date:  01/06/2020 Right Eye Quality was good. Scan locations included subfoveal. Central Foveal Thickness: 206. Progression has been stable. Findings include abnormal foveal contour, central retinal atrophy, outer retinal atrophy. Left Eye Quality was good. Scan locations included subfoveal. Central Foveal Thickness: 212. Progression has been stable. Findings include abnormal foveal contour, central retinal atrophy, outer retinal atrophy. Notes No active CSME OU     Discharge Exam: Vitals:   01/19/20 2031 01/20/20 0444  BP: (!) 128/56 135/61  Pulse: 79 82  Resp: 19 18  Temp: 98.4 F (36.9 C) 99.1 F (37.3 C)  SpO2: 98% 97%   Vitals:   01/19/20 1353 01/19/20 2031 01/20/20 0124 01/20/20 0444  BP: (!) 143/57 (!) 128/56  135/61  Pulse: 83 79  82  Resp: $Remo'20 19  18  'cCAzv$ Temp: 97.9 F (36.6 C) 98.4 F (36.9 C)  99.1 F (37.3 C)  TempSrc: Oral Oral  Oral  SpO2: 96% 98%  97%  Weight:   126.2 kg   Height:        General: Pt is alert, awake, not in acute distress Cardiovascular: RRR, S1/S2 +, no rubs, no gallops Respiratory: CTA bilaterally, no wheezing, no rhonchi Abdominal: Soft, NT, ND, bowel sounds + Extremities: no edema, no cyanosis, very minimal to none erythema left lower extremity.  No open sores.  Chronic toe ulcer on the left foot.  Which is dry.    The results of significant diagnostics from this hospitalization (including imaging, microbiology, ancillary and laboratory) are listed below for reference.     Microbiology: Recent Results (from the past 240 hour(s))  Culture, blood (x 2)     Status: Abnormal   Collection Time: 01/16/20  6:21 PM   Specimen: BLOOD  Result Value Ref Range Status   Specimen Description BLOOD RIGHT ANTECUBITAL  Final   Special Requests   Final    BOTTLES DRAWN AEROBIC AND ANAEROBIC Blood Culture adequate volume   Culture  Setup Time   Final    GRAM NEGATIVE RODS AEROBIC BOTTLE ONLY Organism ID to follow CRITICAL RESULT CALLED TO, READ BACK BY AND  VERIFIED WITH: Thelma Comp PharmD 16:00 01/27/20 ([email or web address]) GRAM POSITIVE COCCI GRAM NEGATIVE RODS ANAEROBIC BOTTLE ONLY CRITICAL RESULT CALLED TO, READ BACK BY AND VERIFIED WITH: K,PIERCE $RemoveBefore'@1751'[id number]$  01/17/20 EB    Culture (A)  Final    PSEUDOMONAS AERUGINOSA STAPHYLOCOCCUS SPECIES (COAGULASE NEGATIVE) THE SIGNIFICANCE OF ISOLATING  THIS ORGANISM FROM A SINGLE SET OF BLOOD CULTURES WHEN MULTIPLE SETS ARE DRAWN IS UNCERTAIN. PLEASE NOTIFY THE MICROBIOLOGY DEPARTMENT WITHIN ONE WEEK IF SPECIATION AND SENSITIVITIES ARE REQUIRED. Performed at [facility], [place] [address] [place], [place] [address]    Report Status 01/19/2020 FINAL  Final   Organism ID, Bacteria PSEUDOMONAS AERUGINOSA  Final      Susceptibility   Pseudomonas aeruginosa - MIC*    CEFTAZIDIME <=1 SENSITIVE Sensitive     CIPROFLOXACIN <=0.25 SENSITIVE Sensitive     GENTAMICIN <=1 SENSITIVE Sensitive     IMIPENEM 8 INTERMEDIATE Intermediate     PIP/TAZO 8 SENSITIVE Sensitive     CEFEPIME 2 SENSITIVE Sensitive     * PSEUDOMONAS AERUGINOSA  Blood Culture ID Panel (Reflexed)     Status: Abnormal   Collection Time: 01/16/20  6:21 PM  Result Value Ref Range Status   Enterococcus faecalis NOT DETECTED NOT DETECTED Final   Enterococcus Faecium NOT DETECTED NOT DETECTED Final   Listeria monocytogenes NOT DETECTED NOT DETECTED Final   Staphylococcus species DETECTED (A) NOT DETECTED Final    Comment: RESULT CALLED TO, READ BACK BY AND VERIFIED WITH: K,PIERCE $RemoveBefore'@1751'[email or web address]$  01/17/20 EB    Staphylococcus aureus (BCID) NOT DETECTED NOT DETECTED Final   Staphylococcus epidermidis NOT DETECTED NOT DETECTED Final   Staphylococcus lugdunensis NOT DETECTED NOT DETECTED Final   Streptococcus species NOT DETECTED NOT DETECTED Final   Streptococcus agalactiae NOT DETECTED NOT DETECTED Final   Streptococcus pneumoniae NOT DETECTED NOT DETECTED Final   Streptococcus pyogenes NOT DETECTED NOT DETECTED Final   A.calcoaceticus-baumannii NOT DETECTED NOT  DETECTED Final   Bacteroides fragilis NOT DETECTED NOT DETECTED Final   Enterobacterales NOT DETECTED NOT DETECTED Final   Enterobacter cloacae complex NOT DETECTED NOT DETECTED Final   Escherichia coli NOT DETECTED NOT DETECTED Final   Klebsiella aerogenes NOT DETECTED NOT DETECTED Final   Klebsiella oxytoca NOT DETECTED NOT DETECTED Final   Klebsiella pneumoniae NOT DETECTED NOT DETECTED Final   Proteus species NOT DETECTED NOT DETECTED Final   Salmonella species NOT DETECTED NOT DETECTED Final   Serratia marcescens NOT DETECTED NOT DETECTED Final   Haemophilus influenzae NOT DETECTED NOT DETECTED Final   Neisseria meningitidis NOT DETECTED NOT DETECTED Final   Pseudomonas aeruginosa DETECTED (A) NOT DETECTED Final    Comment: RESULT CALLED TO, READ BACK BY AND VERIFIED WITH: K,PIERCE $RemoveBefore'@1751'[email or web address]$  01/17/20 EB    Stenotrophomonas maltophilia NOT DETECTED NOT DETECTED Final   Candida albicans NOT DETECTED NOT DETECTED Final   Candida auris NOT DETECTED NOT DETECTED Final   Candida glabrata NOT DETECTED NOT DETECTED Final   Candida krusei NOT DETECTED NOT DETECTED Final   Candida parapsilosis NOT DETECTED NOT DETECTED Final   Candida tropicalis NOT DETECTED NOT DETECTED Final   Cryptococcus neoformans/gattii NOT DETECTED NOT DETECTED Final   CTX-M ESBL NOT DETECTED NOT DETECTED Final   Carbapenem resistance IMP NOT DETECTED NOT DETECTED Final   Carbapenem resistance KPC NOT DETECTED NOT DETECTED Final   Carbapenem resistance NDM NOT DETECTED NOT DETECTED Final   Carbapenem resistance VIM NOT DETECTED NOT DETECTED Final    Comment: Performed at [facility], [place] [address] [place], [place] [address]  Blood Culture ID Panel (Reflexed)     Status: Abnormal   Collection Time: 01/16/20  6:21 PM  Result Value Ref Range Status   Enterococcus faecalis NOT DETECTED NOT DETECTED Final   Enterococcus Faecium NOT DETECTED NOT DETECTED Final   Listeria  monocytogenes NOT DETECTED NOT DETECTED  Final   Staphylococcus species NOT DETECTED NOT DETECTED Final   Staphylococcus aureus (BCID) NOT DETECTED NOT DETECTED Final   Staphylococcus epidermidis NOT DETECTED NOT DETECTED Final   Staphylococcus lugdunensis NOT DETECTED NOT DETECTED Final   Streptococcus species NOT DETECTED NOT DETECTED Final   Streptococcus agalactiae NOT DETECTED NOT DETECTED Final   Streptococcus pneumoniae NOT DETECTED NOT DETECTED Final   Streptococcus pyogenes NOT DETECTED NOT DETECTED Final   A.calcoaceticus-baumannii NOT DETECTED NOT DETECTED Final   Bacteroides fragilis NOT DETECTED NOT DETECTED Final   Enterobacterales NOT DETECTED NOT DETECTED Final   Enterobacter cloacae complex NOT DETECTED NOT DETECTED Final   Escherichia coli NOT DETECTED NOT DETECTED Final   Klebsiella aerogenes NOT DETECTED NOT DETECTED Final   Klebsiella oxytoca NOT DETECTED NOT DETECTED Final   Klebsiella pneumoniae NOT DETECTED NOT DETECTED Final   Proteus species NOT DETECTED NOT DETECTED Final   Salmonella species NOT DETECTED NOT DETECTED Final   Serratia marcescens NOT DETECTED NOT DETECTED Final   Haemophilus influenzae NOT DETECTED NOT DETECTED Final   Neisseria meningitidis NOT DETECTED NOT DETECTED Final   Pseudomonas aeruginosa DETECTED (A) NOT DETECTED Final    Comment: CRITICAL RESULT CALLED TO, READ BACK BY AND VERIFIED WITH: Thelma Comp PharmD 16:00 01/17/20 ([email or web address])    Stenotrophomonas maltophilia NOT DETECTED NOT DETECTED Final   Candida albicans NOT DETECTED NOT DETECTED Final   Candida auris NOT DETECTED NOT DETECTED Final   Candida glabrata NOT DETECTED NOT DETECTED Final   Candida krusei NOT DETECTED NOT DETECTED Final   Candida parapsilosis NOT DETECTED NOT DETECTED Final   Candida tropicalis NOT DETECTED NOT DETECTED Final   Cryptococcus neoformans/gattii NOT DETECTED NOT DETECTED Final   CTX-M ESBL NOT DETECTED NOT DETECTED Final   Carbapenem resistance IMP NOT DETECTED NOT DETECTED Final    Carbapenem resistance KPC NOT DETECTED NOT DETECTED Final   Carbapenem resistance NDM NOT DETECTED NOT DETECTED Final   Carbapenem resistance VIM NOT DETECTED NOT DETECTED Final    Comment: Performed at [facility], [address] [place], [place] [address]  SARS Coronavirus 2 by RT PCR (hospital order, performed in [facility] hospital lab) Nasopharyngeal Nasopharyngeal Swab     Status: None   Collection Time: 01/16/20  6:23 PM   Specimen: Nasopharyngeal Swab  Result Value Ref Range Status   SARS Coronavirus 2 NEGATIVE NEGATIVE Final    Comment: (NOTE) SARS-CoV-2 target nucleic acids are NOT DETECTED.  The SARS-CoV-2 RNA is generally detectable in upper and lower respiratory specimens during the acute phase of infection. The lowest concentration of SARS-CoV-2 viral copies this assay can detect is 250 copies / mL. A negative result does not preclude SARS-CoV-2 infection and should not be used as the sole basis for treatment or other patient management decisions.  A negative result may occur with improper specimen collection / handling, submission of specimen other than nasopharyngeal swab, presence of viral mutation(s) within the areas targeted by this assay, and inadequate number of viral copies (<250 copies / mL). A negative result must be combined with clinical observations, patient history, and epidemiological information.  Fact Sheet for Patients:   StrictlyIdeas.no  Fact Sheet for Healthcare Providers: BankingDealers.co.za  This test is not yet approved or  cleared by the [place] FDA and has been authorized for detection and/or diagnosis of SARS-CoV-2 by FDA under an Emergency Use Authorization (EUA).  This EUA will remain in effect (meaning this test can  be used) for the duration of the COVID-19 declaration under Section 564(b)(1) of the Act, 21 U.S.C. section 360bbb-3(b)(1), unless the authorization is terminated  or revoked sooner.  Performed at [facility], [place] [address] [place], [place] [address]   Urine culture     Status: None   Collection Time: 01/17/20  5:01 AM   Specimen: Urine, Catheterized  Result Value Ref Range Status   Specimen Description URINE, CATHETERIZED  Final   Special Requests NONE  Final   Culture   Final    NO GROWTH Performed at [place] [address], [place], [place] [address]    Report Status 01/18/2020 FINAL  Final  Culture, blood (Routine X 2) w Reflex to ID Panel     Status: None (Preliminary result)   Collection Time: 01/17/20  6:32 AM   Specimen: BLOOD  Result Value Ref Range Status   Specimen Description BLOOD LEFT ANTECUBITAL  Final   Special Requests   Final    BOTTLES DRAWN AEROBIC AND ANAEROBIC Blood Culture results may not be optimal due to an excessive volume of blood received in culture bottles   Culture   Final    NO GROWTH 3 DAYS Performed at [facility], [place] [address] [place], [place] [address]    Report Status PENDING  Incomplete     Labs: BNP (last 3 results) No results for input(s): BNP in the last 8760 hours. Basic Metabolic Panel: Recent Labs  Lab 01/16/20 1821 01/17/20 0624 01/18/20 0640 01/19/20 0925  NA 135 137 136 140  K 4.2 4.1 3.9 4.0  CL 102 102 104 105  CO2 $Re'22 24 24 23  'Qjx$ GLUCOSE 239* 174* 145* 166*  BUN 56* 53* 49* 39*  CREATININE 2.80* 2.65* 2.16* 1.80*  CALCIUM 10.4* 10.0 9.4 9.5   Liver Function Tests: Recent Labs  Lab 01/16/20 1821 01/17/20 0624  AST 29 26  ALT 23 20  ALKPHOS 66 61  BILITOT 0.8 0.9  PROT 7.0 6.8  ALBUMIN 3.7 3.2*   No results for input(s): LIPASE, AMYLASE in the last 168 hours. No results for input(s): AMMONIA in the last 168 hours. CBC: Recent Labs  Lab 01/16/20 1821 01/17/20 0624 01/18/20 0640 01/19/20 0925  WBC 12.0* 15.1* 12.7* 9.4  NEUTROABS 10.7*  --  10.3* 7.4  HGB 12.2* 12.0* 10.0* 10.3*  HCT 37.9* 37.0* 31.1* 31.4*  MCV 96.4 95.1 94.8 95.2   PLT 163 151 122* 161   Cardiac Enzymes: No results for input(s): CKTOTAL, CKMB, CKMBINDEX, TROPONINI in the last 168 hours. BNP: Invalid input(s): POCBNP CBG: Recent Labs  Lab 01/19/20 0609 01/19/20 1136 01/19/20 1630 01/19/20 2139 01/20/20 0559  GLUCAP 145* 178* 131* 190* 170*   D-Dimer No results for input(s): DDIMER in the last 72 hours. Hgb A1c No results for input(s): HGBA1C in the last 72 hours. Lipid Profile No results for input(s): CHOL, HDL, LDLCALC, TRIG, CHOLHDL, LDLDIRECT in the last 72 hours. Thyroid function studies No results for input(s): TSH, T4TOTAL, T3FREE, THYROIDAB in the last 72 hours.  Invalid input(s): FREET3 Anemia work up No results for input(s): VITAMINB12, FOLATE, FERRITIN, TIBC, IRON, RETICCTPCT in the last 72 hours. Urinalysis    Component Value Date/Time   COLORURINE YELLOW 01/17/2020 [place] 01/17/2020 0452   LABSPEC 1.009 01/17/2020 0452   PHURINE 6.0 01/17/2020 [place] 01/17/2020 0452   HGBUR NEGATIVE 01/17/2020 [place] 01/17/2020 9147  BILIRUBINUR negative 05/15/2019 1029   KETONESUR NEGATIVE 01/17/2020 0452   PROTEINUR 30 (A) 01/17/2020 0452   UROBILINOGEN 0.2 05/15/2019 1029   UROBILINOGEN 0.2 01/08/2011 1558   NITRITE NEGATIVE 01/17/2020 0452   LEUKOCYTESUR NEGATIVE 01/17/2020 0452   Sepsis Labs Invalid input(s): PROCALCITONIN,  WBC,  LACTICIDVEN Microbiology Recent Results (from the past 240 hour(s))  Culture, blood (x 2)     Status: Abnormal   Collection Time: 01/16/20  6:21 PM   Specimen: BLOOD  Result Value Ref Range Status   Specimen Description BLOOD RIGHT ANTECUBITAL  Final   Special Requests   Final    BOTTLES DRAWN AEROBIC AND ANAEROBIC Blood Culture adequate volume   Culture  Setup Time   Final    GRAM NEGATIVE RODS AEROBIC BOTTLE ONLY Organism ID to follow CRITICAL RESULT CALLED TO, READ BACK BY AND VERIFIED WITH: Thelma Comp PharmD 16:00 01/27/20  ([email or web address]) GRAM POSITIVE COCCI GRAM NEGATIVE RODS ANAEROBIC BOTTLE ONLY CRITICAL RESULT CALLED TO, READ BACK BY AND VERIFIED WITH: K,PIERCE $RemoveBefore'@1751'[id number]$  01/17/20 EB    Culture (A)  Final    PSEUDOMONAS AERUGINOSA STAPHYLOCOCCUS SPECIES (COAGULASE NEGATIVE) THE SIGNIFICANCE OF ISOLATING THIS ORGANISM FROM A SINGLE SET OF BLOOD CULTURES WHEN MULTIPLE SETS ARE DRAWN IS UNCERTAIN. PLEASE NOTIFY THE MICROBIOLOGY DEPARTMENT WITHIN ONE WEEK IF SPECIATION AND SENSITIVITIES ARE REQUIRED. Performed at [facility], [place] [address] [place], [place] [address]    Report Status 01/19/2020 FINAL  Final   Organism ID, Bacteria PSEUDOMONAS AERUGINOSA  Final      Susceptibility   Pseudomonas aeruginosa - MIC*    CEFTAZIDIME <=1 SENSITIVE Sensitive     CIPROFLOXACIN <=0.25 SENSITIVE Sensitive     GENTAMICIN <=1 SENSITIVE Sensitive     IMIPENEM 8 INTERMEDIATE Intermediate     PIP/TAZO 8 SENSITIVE Sensitive     CEFEPIME 2 SENSITIVE Sensitive     * PSEUDOMONAS AERUGINOSA  Blood Culture ID Panel (Reflexed)     Status: Abnormal   Collection Time: 01/16/20  6:21 PM  Result Value Ref Range Status   Enterococcus faecalis NOT DETECTED NOT DETECTED Final   Enterococcus Faecium NOT DETECTED NOT DETECTED Final   Listeria monocytogenes NOT DETECTED NOT DETECTED Final   Staphylococcus species DETECTED (A) NOT DETECTED Final    Comment: RESULT CALLED TO, READ BACK BY AND VERIFIED WITH: K,PIERCE $RemoveBefore'@1751'[email or web address]$  01/17/20 EB    Staphylococcus aureus (BCID) NOT DETECTED NOT DETECTED Final   Staphylococcus epidermidis NOT DETECTED NOT DETECTED Final   Staphylococcus lugdunensis NOT DETECTED NOT DETECTED Final   Streptococcus species NOT DETECTED NOT DETECTED Final   Streptococcus agalactiae NOT DETECTED NOT DETECTED Final   Streptococcus pneumoniae NOT DETECTED NOT DETECTED Final   Streptococcus pyogenes NOT DETECTED NOT DETECTED Final   A.calcoaceticus-baumannii NOT DETECTED NOT DETECTED Final   Bacteroides fragilis NOT DETECTED  NOT DETECTED Final   Enterobacterales NOT DETECTED NOT DETECTED Final   Enterobacter cloacae complex NOT DETECTED NOT DETECTED Final   Escherichia coli NOT DETECTED NOT DETECTED Final   Klebsiella aerogenes NOT DETECTED NOT DETECTED Final   Klebsiella oxytoca NOT DETECTED NOT DETECTED Final   Klebsiella pneumoniae NOT DETECTED NOT DETECTED Final   Proteus species NOT DETECTED NOT DETECTED Final   Salmonella species NOT DETECTED NOT DETECTED Final   Serratia marcescens NOT DETECTED NOT DETECTED Final   Haemophilus influenzae NOT DETECTED NOT DETECTED Final   Neisseria meningitidis NOT DETECTED NOT DETECTED Final   Pseudomonas aeruginosa DETECTED (A) NOT DETECTED Final    Comment:  RESULT CALLED TO, READ BACK BY AND VERIFIED WITH: K,PIERCE $RemoveBefore'@1751'[id number]$  01/17/20 EB    Stenotrophomonas maltophilia NOT DETECTED NOT DETECTED Final   Candida albicans NOT DETECTED NOT DETECTED Final   Candida auris NOT DETECTED NOT DETECTED Final   Candida glabrata NOT DETECTED NOT DETECTED Final   Candida krusei NOT DETECTED NOT DETECTED Final   Candida parapsilosis NOT DETECTED NOT DETECTED Final   Candida tropicalis NOT DETECTED NOT DETECTED Final   Cryptococcus neoformans/gattii NOT DETECTED NOT DETECTED Final   CTX-M ESBL NOT DETECTED NOT DETECTED Final   Carbapenem resistance IMP NOT DETECTED NOT DETECTED Final   Carbapenem resistance KPC NOT DETECTED NOT DETECTED Final   Carbapenem resistance NDM NOT DETECTED NOT DETECTED Final   Carbapenem resistance VIM NOT DETECTED NOT DETECTED Final    Comment: Performed at [facility] [address] [place], [place] [address]  Blood Culture ID Panel (Reflexed)     Status: Abnormal   Collection Time: 01/16/20  6:21 PM  Result Value Ref Range Status   Enterococcus faecalis NOT DETECTED NOT DETECTED Final   Enterococcus Faecium NOT DETECTED NOT DETECTED Final   Listeria monocytogenes NOT DETECTED NOT DETECTED Final   Staphylococcus species NOT DETECTED NOT  DETECTED Final   Staphylococcus aureus (BCID) NOT DETECTED NOT DETECTED Final   Staphylococcus epidermidis NOT DETECTED NOT DETECTED Final   Staphylococcus lugdunensis NOT DETECTED NOT DETECTED Final   Streptococcus species NOT DETECTED NOT DETECTED Final   Streptococcus agalactiae NOT DETECTED NOT DETECTED Final   Streptococcus pneumoniae NOT DETECTED NOT DETECTED Final   Streptococcus pyogenes NOT DETECTED NOT DETECTED Final   A.calcoaceticus-baumannii NOT DETECTED NOT DETECTED Final   Bacteroides fragilis NOT DETECTED NOT DETECTED Final   Enterobacterales NOT DETECTED NOT DETECTED Final   Enterobacter cloacae complex NOT DETECTED NOT DETECTED Final   Escherichia coli NOT DETECTED NOT DETECTED Final   Klebsiella aerogenes NOT DETECTED NOT DETECTED Final   Klebsiella oxytoca NOT DETECTED NOT DETECTED Final   Klebsiella pneumoniae NOT DETECTED NOT DETECTED Final   Proteus species NOT DETECTED NOT DETECTED Final   Salmonella species NOT DETECTED NOT DETECTED Final   Serratia marcescens NOT DETECTED NOT DETECTED Final   Haemophilus influenzae NOT DETECTED NOT DETECTED Final   Neisseria meningitidis NOT DETECTED NOT DETECTED Final   Pseudomonas aeruginosa DETECTED (A) NOT DETECTED Final    Comment: CRITICAL RESULT CALLED TO, READ BACK BY AND VERIFIED WITH: Thelma Comp PharmD 16:00 01/17/20 ([email or web address])    Stenotrophomonas maltophilia NOT DETECTED NOT DETECTED Final   Candida albicans NOT DETECTED NOT DETECTED Final   Candida auris NOT DETECTED NOT DETECTED Final   Candida glabrata NOT DETECTED NOT DETECTED Final   Candida krusei NOT DETECTED NOT DETECTED Final   Candida parapsilosis NOT DETECTED NOT DETECTED Final   Candida tropicalis NOT DETECTED NOT DETECTED Final   Cryptococcus neoformans/gattii NOT DETECTED NOT DETECTED Final   CTX-M ESBL NOT DETECTED NOT DETECTED Final   Carbapenem resistance IMP NOT DETECTED NOT DETECTED Final   Carbapenem resistance KPC NOT DETECTED NOT DETECTED  Final   Carbapenem resistance NDM NOT DETECTED NOT DETECTED Final   Carbapenem resistance VIM NOT DETECTED NOT DETECTED Final    Comment: Performed at [facility], [address] [place], [place] [address]  SARS Coronavirus 2 by RT PCR (hospital order, performed in [facility] hospital lab) Nasopharyngeal Nasopharyngeal Swab     Status: None   Collection Time: 01/16/20  6:23 PM   Specimen: Nasopharyngeal  Swab  Result Value Ref Range Status   SARS Coronavirus 2 NEGATIVE NEGATIVE Final    Comment: (NOTE) SARS-CoV-2 target nucleic acids are NOT DETECTED.  The SARS-CoV-2 RNA is generally detectable in upper and lower respiratory specimens during the acute phase of infection. The lowest concentration of SARS-CoV-2 viral copies this assay can detect is 250 copies / mL. A negative result does not preclude SARS-CoV-2 infection and should not be used as the sole basis for treatment or other patient management decisions.  A negative result may occur with improper specimen collection / handling, submission of specimen other than nasopharyngeal swab, presence of viral mutation(s) within the areas targeted by this assay, and inadequate number of viral copies (<250 copies / mL). A negative result must be combined with clinical observations, patient history, and epidemiological information.  Fact Sheet for Patients:   StrictlyIdeas.no  Fact Sheet for Healthcare Providers: BankingDealers.co.za  This test is not yet approved or  cleared by the [place] FDA and has been authorized for detection and/or diagnosis of SARS-CoV-2 by FDA under an Emergency Use Authorization (EUA).  This EUA will remain in effect (meaning this test can be used) for the duration of the COVID-19 declaration under Section 564(b)(1) of the Act, 21 U.S.C. section 360bbb-3(b)(1), unless the authorization is terminated or revoked sooner.  Performed at [facility], [place] [address] [place], [place] [address]   Urine culture     Status: None   Collection Time: 01/17/20  5:01 AM   Specimen: Urine, Catheterized  Result Value Ref Range Status   Specimen Description URINE, CATHETERIZED  Final   Special Requests NONE  Final   Culture   Final    NO GROWTH Performed at [facility] [address], [place], [place] [address]    Report Status 01/18/2020 FINAL  Final  Culture, blood (Routine X 2) w Reflex to ID Panel     Status: None (Preliminary result)   Collection Time: 01/17/20  6:32 AM   Specimen: BLOOD  Result Value Ref Range Status   Specimen Description BLOOD LEFT ANTECUBITAL  Final   Special Requests   Final    BOTTLES DRAWN AEROBIC AND ANAEROBIC Blood Culture results may not be optimal due to an excessive volume of blood received in culture bottles   Culture   Final    NO GROWTH 3 DAYS Performed at [facility], [place] [address], [place],  [address]    Report Status PENDING  Incomplete     Time coordinating discharge: Over 30 minutes  SIGNED:   Darliss Cheney, MD  [facility] 01/20/2020, 9:16 AM  If 7PM-7AM, please contact night-coverage [email or web address]

## 2020-01-20 NOTE — TOC Transition Note (Signed)
Transition of Care [facility]) - CM/SW Discharge Note   Patient Details  Name: Micheal Walls MRN: [id number] Date of Birth: 04/13/1936  [facility]) CM/SW Contact:  Bartholomew Crews, RN Phone Number: [phone] 01/20/2020, 9:03 AM   Clinical Narrative:     Notified by nursing of patient readiness to transition home. [place] orders updated to include RN, PT, OT per previous NCM note - MD notified of update for resumption of services. [facility] notified of transition home today. No further TOC needs.    Final next level of care: Home w Home Health Services Barriers to Discharge: No Barriers Identified   Patient Goals and CMS Choice   CMS Medicare.gov Compare Post Acute Care list provided to:: Patient    Discharge Placement                       Discharge Plan and Services   Discharge Planning Services: CM Consult            DME Arranged: N/A DME Agency: NA Date DME Agency Contacted: 01/19/20 Time DME Agency Contacted: [id number] Representative spoke with at DME Agency: [place]: RN, PT, OT [place] Agency: [place] Date [place]: 01/20/20 Time [place]: [phone] Representative spoke with at [place]: [place] ([place]) Interventions     Readmission Risk Interventions No flowsheet data found.

## 2020-01-21 ENCOUNTER — Ambulatory Visit: Payer: Medicare Other

## 2020-01-21 ENCOUNTER — Encounter: Payer: Self-pay | Admitting: Nurse Practitioner

## 2020-01-21 ENCOUNTER — Telehealth: Payer: Self-pay

## 2020-01-21 DIAGNOSIS — E1159 Type 2 diabetes mellitus with other circulatory complications: Secondary | ICD-10-CM

## 2020-01-21 DIAGNOSIS — I10 Essential (primary) hypertension: Secondary | ICD-10-CM

## 2020-01-21 DIAGNOSIS — Z794 Long term (current) use of insulin: Secondary | ICD-10-CM

## 2020-01-21 NOTE — Chronic Care Management (AMB) (Signed)
Chronic Care Management   Telephone Outreach Note  01/21/2020 Name: Micheal Walls MRN: [id number] DOB: 04-13-1936  Referred by: Walls Liaison   I reached out to Micheal Walls today by phone in response to a referral sent by Walls liaison. Micheal Walls and I briefly discussed care management needs related to HTN and DMII  SDOH (Social Determinants of Health) assessments performed: No See Care Plan activities for detailed interventions related to Micheal Walls)      Outpatient Encounter Medications as of 01/21/2020  Medication Sig  . allopurinol (ZYLOPRIM) 100 MG tablet Take 1 tablet (100 mg total) by mouth daily. (Patient taking differently: Take 100 mg by mouth 2 (two) times daily. )  . antiseptic oral rinse (BIOTENE) LIQD 15 mLs by Mouth Rinse route as needed for dry mouth.  Marland Kitchen aspirin 81 MG tablet Take 162 mg by mouth daily.  [name] Use a cotton tip applicator. Paint between toes of left foot as needed once daily. (Patient taking differently: See admin instructions. Use a cotton tip applicator. Paint between toes of left foot once daily.)  . colchicine 0.6 MG tablet Take 0.6 mg by mouth daily.   Marland Kitchen diltiazem (CARDIZEM CD) 300 MG 24 hr capsule TAKE ONE CAPSULE EACH DAY (Patient taking differently: Take 300 mg by mouth daily. )  . dorzolamide (TRUSOPT) 2 % ophthalmic solution Place 1 drop into both eyes 2 (two) times daily.  . ferrous sulfate 325 (65 FE) MG tablet Take 325 mg by mouth daily with breakfast.  . fish oil-omega-3 fatty acids 1000 MG capsule Take 1 g by mouth 2 (two) times daily.   . furosemide (LASIX) 40 MG tablet Take 1 tablet (40 mg total) by mouth daily. (Patient taking differently: Take 40 mg by mouth 2 (two) times daily. )  . gabapentin (NEURONTIN) 100 MG capsule Take 1 capsule  by mouth 3  times daily. (Patient taking differently: Take 100 mg by mouth 3 (three) times daily. )  . glucose blood (FREESTYLE PRECISION NEO TEST)  test strip Use as instructed  . Glycerin-Polysorbate 80 (REFRESH DRY EYE THERAPY OP) Place 1 drop into both eyes in the morning and at bedtime.  . hydrocortisone (ANUSOL-HC) 25 MG suppository Place 25 mg rectally 2 (two) times daily as needed for hemorrhoids or anal itching.  . Insulin Degludec (TRESIBA FLEXTOUCH [place]) Inject 10 mg into the skin at bedtime.  Marland Kitchen ketotifen (ALAWAY) 0.025 % ophthalmic solution Place 1 drop into both eyes 2 (two) times daily.  Marland Kitchen latanoprost (XALATAN) 0.005 % ophthalmic solution Place 1 drop into both eyes at bedtime.   Marland Kitchen levofloxacin (LEVAQUIN) 750 MG tablet Take 1 tablet (750 mg total) by mouth every other day for 4 doses.  Marland Kitchen losartan (COZAAR) 100 MG tablet Take 100 mg by mouth at bedtime.  . Multiple Vitamin (MULTIVITAMIN) tablet Take 1 tablet by mouth daily.  . niacin (NIASPAN) 500 MG CR tablet TAKE 2 TABLETS AT BEDTIME  . pantoprazole (PROTONIX) 40 MG tablet Take 1 tablet  by mouth daily. (Patient taking differently: Take 40 mg by mouth daily. )  . pramoxine-hydrocortisone (PRAMOSONE) cream Apply topically 3 (three) times daily. Apply topically to hemorrhoids four times daily as needed after bowel movements, [name], [name], (OZEMPIC, [name],) 2 MG/1.5ML SOPN Inject 1 mg into the skin once a week.   No facility-administered encounter medications on file as of 01/21/2020.    Micheal Walls was given information about Chronic Care Management services today including:  1. CCM service includes personalized support from designated clinical staff supervised by his physician, [name], [name] has been notified of this outreach and Micheal Walls Micheal Walls decision and plan.   [name], [name], [name], [name]

## 2020-01-21 NOTE — Telephone Encounter (Signed)
Transition Care Management Follow-up Telephone Call  Date of discharge and from where: [facility] 8/9/2  How have you been since you were released from the hospital? He is doing ok  Any questions or concerns? no  Items Reviewed:  Did the pt receive and understand the discharge instructions provided? yes  Medications obtained and verified? yes  Any new allergies since your discharge? no  Dietary orders reviewed? no  Do you have support at home? yes  Other (ie: DME, Home Health, etc) yes   Functional Questionnaire: (I = Independent and D = Dependent)  Bathing/Dressing- I   Meal Prep- I  Eating- I  Maintaining continence- I  Transferring/Ambulation- D  Managing Meds- I   Follow up appointments reviewed:    PCP Hospital f/u appt confirmed? scheduled to see [name] DNP,[name]-BC on 01/23/20 at [facility] f/u appt confirmed?  no  Are transportation arrangements needed? He is going to get a family member to bring him  If their condition worsens, is the pt aware to call  their PCP or go to the ED? yes  Was the patient provided with contact information for the PCP's office or ED? yes  Was the pt encouraged to call back with questions or concerns? yes

## 2020-01-22 LAB — CULTURE, BLOOD (ROUTINE X 2): Culture: NO GROWTH

## 2020-01-23 ENCOUNTER — Encounter: Payer: Self-pay | Admitting: Nurse Practitioner

## 2020-01-23 ENCOUNTER — Ambulatory Visit (INDEPENDENT_AMBULATORY_CARE_PROVIDER_SITE_OTHER): Payer: Medicare Other | Admitting: Nurse Practitioner

## 2020-01-23 ENCOUNTER — Other Ambulatory Visit: Payer: Self-pay

## 2020-01-23 VITALS — BP 140/76 | HR 91 | Temp 98.7°F

## 2020-01-23 DIAGNOSIS — I10 Essential (primary) hypertension: Secondary | ICD-10-CM

## 2020-01-23 DIAGNOSIS — M79605 Pain in left leg: Secondary | ICD-10-CM

## 2020-01-23 DIAGNOSIS — Z794 Long term (current) use of insulin: Secondary | ICD-10-CM

## 2020-01-23 DIAGNOSIS — E1159 Type 2 diabetes mellitus with other circulatory complications: Secondary | ICD-10-CM

## 2020-01-23 DIAGNOSIS — Z89611 Acquired absence of right leg above knee: Secondary | ICD-10-CM

## 2020-01-23 DIAGNOSIS — R05 Cough: Secondary | ICD-10-CM | POA: Diagnosis not present

## 2020-01-23 DIAGNOSIS — R6889 Other general symptoms and signs: Secondary | ICD-10-CM

## 2020-01-23 DIAGNOSIS — N184 Chronic kidney disease, stage 4 (severe): Secondary | ICD-10-CM

## 2020-01-23 DIAGNOSIS — R059 Cough, unspecified: Secondary | ICD-10-CM

## 2020-01-23 MED ORDER — DICLOFENAC SODIUM 1 % EX GEL: 2.0000 g | Freq: Four times a day (QID) | CUTANEOUS | 2 refills | Status: DC

## 2020-01-23 MED ORDER — BENZONATATE 100 MG PO CAPS: 100.0000 mg | ORAL_CAPSULE | Freq: Four times a day (QID) | ORAL | 1 refills | Status: DC | PRN

## 2020-01-23 MED ORDER — FUROSEMIDE 40 MG PO TABS: ORAL_TABLET | ORAL | 0 refills | Status: DC

## 2020-01-23 NOTE — Progress Notes (Signed)
I,Yamilka Roman [employer] as a Education administrator for [employer], [name].,have documented all relevant documentation on the behalf of [name], [name],as directed by  [name], [name] while in the presence of [name], [place]. This visit occurred during the SARS-CoV-2 public health emergency.  Safety protocols were in place, including screening questions prior to the visit, additional usage of staff PPE, and extensive cleaning of exam room while observing appropriate contact time as indicated for disinfecting solutions.  Subjective:     Patient ID: Micheal Walls , male    DOB: 07-Jun-1936 , 84 y.o.   MRN: [id number]   Chief Complaint  Patient presents with  . Hospitalization Follow-up    HPI  He is here today for hospital admission for sepsis related to foot wound.  He reports the wound has healed.    He does sit at home with his left leg on a foot stool. He reports at night he will elevate his foot on a pillow and the leg will go down.  The home health nurse is coming to see him at home and obtaining blood two times a week. She was there Wednesday. He is starting back with his physical therapy tomorrow.  Denies falls since being home.    Diabetes He presents for his follow-up diabetic visit. He has type 2 diabetes mellitus. His disease course has been improving. Pertinent negatives for hypoglycemia include no confusion, dizziness, headaches or nervousness/anxiousness. There are no diabetic associated symptoms. Pertinent negatives for diabetes include no blurred vision, no fatigue, no polydipsia, no polyphagia and no polyuria. There are no hypoglycemic complications. Symptoms are improving. There are no diabetic complications. Risk factors for coronary artery disease include sedentary lifestyle and obesity. Current diabetic treatment includes oral agent (monotherapy) (no longer on Tresiba since December). He is compliant with treatment all of the time. He is following a diabetic diet. When asked  about meal planning, he reported none. He has not had a previous visit with a dietitian. He rarely participates in exercise. His home blood glucose trend is decreasing steadily. (Blood sugar was 135 this morning. ) An ACE inhibitor/angiotensin II receptor blocker is being taken. He sees a podiatrist (Dr Elisha Ponder).Eye exam is current (Dr. Radene Ou - March 2020).  Hypertension This is a chronic problem. The current episode started more than 1 year ago. The problem is unchanged. The problem is uncontrolled. Associated symptoms include peripheral edema (left lower extremity). Pertinent negatives include no anxiety, blurred vision or headaches. There are no associated agents to hypertension. Risk factors for coronary artery disease include obesity and sedentary lifestyle. Past treatments include ACE inhibitors. There are no compliance problems.  Hypertensive end-organ damage includes kidney disease. There is no history of angina. Identifiable causes of hypertension include chronic renal disease.     Past Medical History:  Diagnosis Date  . Amputated below knee ([place])    right  . Anemia   . Diastolic heart failure ([place])   . Diverticulosis   . DM (diabetes mellitus) ([place])   . Gout   . Hyperlipemia   . OSA on CPAP   . RBBB   . Systemic hypertension      Family History  Problem Relation Age of Onset  . Diabetes Mother   . Heart attack Father   . Cancer Sister      Current Outpatient Medications:  .  allopurinol (ZYLOPRIM) 100 MG tablet, Take 1 tablet (100 mg total) by mouth daily. (Patient taking differently: Take 100 mg by mouth 2 (two)  times daily. ), Disp: 90 tablet, Rfl: 1 .  antiseptic oral rinse (BIOTENE) LIQD, 15 mLs by Mouth Rinse route as needed for dry mouth., Disp: , Rfl:  .  aspirin 81 MG tablet, Take 162 mg by mouth daily., Disp: , Rfl:  .  Castellani Paint Modified 1.5 % LIQD, Use a cotton tip applicator. Paint between toes of left foot as needed once daily. (Patient taking  differently: See admin instructions. Use a cotton tip applicator. Paint between toes of left foot once daily.), Disp: 1 Bottle, Rfl: 1 .  colchicine 0.6 MG tablet, Take 0.6 mg by mouth daily. , Disp: , Rfl:  .  diltiazem (CARDIZEM CD) 300 MG 24 hr capsule, TAKE ONE CAPSULE EACH DAY (Patient taking differently: Take 300 mg by mouth daily. ), Disp: 90 capsule, Rfl: 1 .  dorzolamide (TRUSOPT) 2 % ophthalmic solution, Place 1 drop into both eyes 2 (two) times daily., Disp: , Rfl:  .  ferrous sulfate 325 (65 FE) MG tablet, Take 325 mg by mouth daily with breakfast., Disp: , Rfl:  .  fish oil-omega-3 fatty acids 1000 MG capsule, Take 1 g by mouth 2 (two) times daily. , Disp: , Rfl:  .  furosemide (LASIX) 40 MG tablet, Take 2 tabs in morning and 1 tab evening, Disp: 180 tablet, Rfl: 0 .  gabapentin (NEURONTIN) 100 MG capsule, Take 1 capsule  by mouth 3  times daily. (Patient taking differently: Take 100 mg by mouth 3 (three) times daily. ), Disp: 270 capsule, Rfl: 0 .  glucose blood (FREESTYLE PRECISION NEO TEST) test strip, Use as instructed, Disp: 100 each, Rfl: 12 .  Glycerin-Polysorbate 80 (REFRESH DRY EYE THERAPY OP), Place 1 drop into both eyes in the morning and at bedtime., Disp: , Rfl:  .  hydrocortisone (ANUSOL-HC) 25 MG suppository, Place 25 mg rectally 2 (two) times daily as needed for hemorrhoids or anal itching., Disp: , Rfl:  .  Insulin Degludec (TRESIBA FLEXTOUCH ), Inject 10 mg into the skin at bedtime., Disp: , Rfl:  .  ketotifen (ALAWAY) 0.025 % ophthalmic solution, Place 1 drop into both eyes 2 (two) times daily., Disp: , Rfl:  .  latanoprost (XALATAN) 0.005 % ophthalmic solution, Place 1 drop into both eyes at bedtime. , Disp: , Rfl:  .  losartan (COZAAR) 100 MG tablet, Take 100 mg by mouth at bedtime., Disp: , Rfl:  .  Multiple Vitamin (MULTIVITAMIN) tablet, Take 1 tablet by mouth daily., Disp: , Rfl:  .  niacin (NIASPAN) 500 MG CR tablet, TAKE 2 TABLETS AT BEDTIME, Disp: 180 tablet,  Rfl: 1 .  pantoprazole (PROTONIX) 40 MG tablet, Take 1 tablet  by mouth daily. (Patient taking differently: Take 40 mg by mouth daily. ), Disp: 90 tablet, Rfl: 0 .  pramoxine-hydrocortisone (PRAMOSONE) cream, Apply topically 3 (three) times daily. Apply topically to hemorrhoids four times daily as needed after bowel movements, for external use only, Disp: , Rfl:  .  PRESCRIPTION MEDICATION, See admin instructions. CPAP- At bedtime, Disp: , Rfl:  .  rosuvastatin (CRESTOR) 10 MG tablet, Take 1 tablet (10 mg total) by mouth daily. (Patient taking differently: Take 10 mg by mouth once a week. ), Disp: 90 tablet, Rfl: 1 .  Semaglutide, [name], (OZEMPIC, [name],) 2 MG/1.5ML SOPN, Inject 1 mg into the skin once a week., Disp: 6 pen, Rfl: 1 .  benzonatate (TESSALON PERLES) 100 MG capsule, Take 1 capsule (100 mg total) by mouth every 6 (six) hours as  needed., Disp: 30 capsule, Rfl: 1 .  diclofenac Sodium (VOLTAREN) 1 % GEL, Apply 2 g topically 4 (four) times daily., Disp: 100 g, Rfl: 2 .  furosemide (LASIX) 20 MG tablet, Take 1 tab midday for 3 days only., Disp: 30 tablet, Rfl: 0   No Known Allergies   Review of Systems  Constitutional: Negative for fatigue.  Eyes: Negative for blurred vision.  Endocrine: Negative for polydipsia, polyphagia and polyuria.  Musculoskeletal:       Right stump having pain from his prosthesis.  Neurological: Negative for dizziness and headaches.  Psychiatric/Behavioral: Negative for confusion. The patient is not nervous/anxious.      Today's Vitals   01/23/20 1411  BP: 140/76  Pulse: 91  Temp: 98.7 F (37.1 C)  TempSrc: Oral  PainSc: 0-No pain   There is no height or weight on file to calculate BMI.   Objective:  Physical Exam Constitutional:      General: He is not in acute distress.    Appearance: Normal appearance.  Cardiovascular:     Rate and Rhythm: Normal rate and regular rhythm.     Pulses: Normal pulses.     Heart sounds: Normal heart  sounds. No murmur heard.   Pulmonary:     Effort: Pulmonary effort is normal. No respiratory distress.     Breath sounds: Normal breath sounds.  Skin:    Capillary Refill: Capillary refill takes less than 2 seconds.  Neurological:     General: No focal deficit present.     Mental Status: He is alert and oriented to person, place, and time.     Cranial Nerves: No cranial nerve deficit.  Psychiatric:        Mood and Affect: Mood normal.        Behavior: Behavior normal.        Thought Content: Thought content normal.        Judgment: Judgment normal.         Assessment And Plan:     1. Type 2 diabetes mellitus with other circulatory complication, with long-term current use of insulin (HCC)  Chronic, controlled  Continue with current medications  Encouraged to limit intake of sugary foods and drinks - Hemoglobin A1c  2. Essential hypertension . B/P is fairly controlled.  Marland Kitchen BMP ordered to check renal function.  . The importance of regular exercise and dietary modification was stressed to the patient.  - BMP8+eGFR  3. Stage 4 chronic kidney disease ([place]) - CBC  4. Cough - benzonatate (TESSALON PERLES) 100 MG capsule; Take 1 capsule (100 mg total) by mouth every 6 (six) hours as needed.  Dispense: 30 capsule; Refill: 1  5. Right above-knee amputee ([place])  6. Sensation of feeling cold  Will check metabolic causes, feet and legs are not cold to touch - TSH  7. Left leg pain  He has significant swelling to his lower extremity and concern about intermittent wounds likely vascular related TCM Performed. A member of the clinical team spoke with the patient upon dischare. Discharge summary was reviewed in full detail during the visit. Meds reconciled and compared to discharge meds. Medication list is updated and reviewed with the patient.  Greater than 50% face to face time was spent in counseling an coordination of care.  All questions were answered to the satisfaction of the  patient.   - diclofenac Sodium (VOLTAREN) 1 % GEL; Apply 2 g topically 4 (four) times daily.  Dispense: 100 g; Refill: 2  Patient was given opportunity to ask questions. Patient verbalized understanding of the plan and was able to repeat key elements of the plan. All questions were answered to their satisfaction.  [name], [name]   I, [name], [name], have reviewed all documentation for this visit. The documentation on 02/26/20 for the exam, diagnosis, procedures, and orders are all accurate and complete.  THE PATIENT IS ENCOURAGED TO PRACTICE SOCIAL DISTANCING DUE TO THE COVID-19 PANDEMIC.

## 2020-01-24 LAB — TSH: TSH: 2.34 u[IU]/mL (ref 0.450–4.500)

## 2020-01-24 LAB — BMP8+EGFR
BUN/Creatinine Ratio: 18 (ref 10–24)
BUN: 35 mg/dL — ABNORMAL HIGH (ref 8–27)
CO2: 24 mmol/L (ref 20–29)
Calcium: 10.3 mg/dL — ABNORMAL HIGH (ref 8.6–10.2)
Chloride: 101 mmol/L (ref 96–106)
Creatinine, Ser: 1.97 mg/dL — ABNORMAL HIGH (ref 0.76–1.27)
GFR calc Af Amer: 36 mL/min/{1.73_m2} — ABNORMAL LOW (ref >59)
GFR calc non Af Amer: 31 mL/min/{1.73_m2} — ABNORMAL LOW (ref >59)
Glucose: 141 mg/dL — ABNORMAL HIGH (ref 65–99)
Potassium: 4.3 mmol/L (ref 3.5–5.2)
Sodium: 136 mmol/L (ref 134–144)

## 2020-01-24 LAB — CBC
Hematocrit: 32.8 % — ABNORMAL LOW (ref 37.5–51.0)
Hemoglobin: 10.8 g/dL — ABNORMAL LOW (ref 13.0–17.7)
MCH: 29.8 pg (ref 26.6–33.0)
MCHC: 32.9 g/dL (ref 31.5–35.7)
MCV: 91 fL (ref 79–97)
Platelets: 146 10*3/uL — ABNORMAL LOW (ref 150–450)
RBC: 3.62 x10E6/uL — ABNORMAL LOW (ref 4.14–5.80)
RDW: 14.3 % (ref 11.6–15.4)
WBC: 5.8 10*3/uL (ref 3.4–10.8)

## 2020-01-24 LAB — HEMOGLOBIN A1C
Est. average glucose Bld gHb Est-mCnc: 148 mg/dL
Hgb A1c MFr Bld: 6.8 % — ABNORMAL HIGH (ref 4.8–5.6)

## 2020-01-27 ENCOUNTER — Telehealth: Payer: Self-pay

## 2020-01-27 DIAGNOSIS — A4152 Sepsis due to Pseudomonas: Secondary | ICD-10-CM | POA: Diagnosis not present

## 2020-01-27 NOTE — Telephone Encounter (Signed)
Per JM that will be fine   kimberly from [facility] called requesting verbal orders for [facility] 1 week 1, 2 week 2 and 1 week 2. [phone]

## 2020-01-28 ENCOUNTER — Telehealth: Payer: Self-pay

## 2020-01-28 NOTE — Telephone Encounter (Signed)
Micheal Walls PT with [facility] called requesting verbal orders for 2 times a week for 6 weeks and once a week for 2 weeks to continue his progress with strength,balance, gait and endurance. [phone]   I returned his call and gave verbal orders okay. YL,RMA

## 2020-01-30 ENCOUNTER — Ambulatory Visit (INDEPENDENT_AMBULATORY_CARE_PROVIDER_SITE_OTHER): Payer: Medicare Other | Admitting: Podiatry

## 2020-01-30 ENCOUNTER — Other Ambulatory Visit: Payer: Self-pay

## 2020-01-30 DIAGNOSIS — M7989 Other specified soft tissue disorders: Secondary | ICD-10-CM

## 2020-01-30 DIAGNOSIS — E1142 Type 2 diabetes mellitus with diabetic polyneuropathy: Secondary | ICD-10-CM

## 2020-01-30 DIAGNOSIS — L97522 Non-pressure chronic ulcer of other part of left foot with fat layer exposed: Secondary | ICD-10-CM

## 2020-01-30 DIAGNOSIS — M79662 Pain in left lower leg: Secondary | ICD-10-CM

## 2020-01-30 NOTE — Patient Instructions (Addendum)
Your ultrasound is tomorrow 8/20 at 10:00AM at :  [address], [place], [place] [address]

## 2020-01-30 NOTE — Progress Notes (Signed)
  Subjective:  Patient ID: Micheal Walls, male    DOB: 18-Feb-1936,  MRN: [id number]  Chief Complaint  Patient presents with  . Diabetic Ulcer    left great toe, s/p BKA right, A1C  6.8  . Chronic Kidney Disease    stage 4  . PAD    84 y.o. male presents with the above complaint. History confirmed with patient.  He was recently hospitalized with sepsis and Pseudomonas bacteremia, he was discharged on Levaquin.  He notes increased leg swelling today  Objective:  Physical Exam: warm, good capillary refill, normal DP and PT pulses and ulceration at left hallux as noted below.  Severe pitting edema, the worst I have seen him with weeping blisters on the lower medial and lateral leg, he has pain with calf compression calf is warm and red, and he has a positive Homans' sign Left Foot: Hallux ulceration dorsal medial 3 mm x 3 mm x 3 mm.  Surrounding hyperkeratosis and fibrotic base.  No signs of infection.  His ulcer has improved Right Foot: Previous BKA    Assessment:   1. Pain and swelling of left lower leg   2. Diabetic peripheral neuropathy associated with type 2 diabetes mellitus (Micheal Walls)   3. Skin ulcer of second toe of left foot with fat layer exposed (Micheal Walls)      Plan:  Patient was evaluated and treated and all questions answered.   Today he has severe leg swelling, lymphedema and weeping blisters on the leg.  He is at risk for developing venous ulceration.  My primary concern right now is that he has a high risk and symptoms of deep venous thrombosis.  I recommended he get a stat DVT ultrasound study, this to be scheduled at [facility] for 10 AM tomorrow and care of this was coordinated with the imaging center and the patient.   -Dressing applied consisting of antibiotic ointment and adhesive bandage -Wound cleansed and debrided Procedure: Selective Debridement of Wound Rationale: Removal of devitalized tissue from the wound to promote  healing.  Pre-Debridement Wound Measurements: 0. 3 cm x 0. 3 cm x 0. 3 cm  Post-Debridement Wound Measurements: same as pre-debridement. Type of Debridement: sharp selective Tissue Removed: Devitalized soft-tissue Dressing: Dry, sterile, compression dressing. Disposition: Patient tolerated procedure well. Patient to return in 1 week for follow-up.   Return in about 1 month (around 03/01/2020) for wound re-check.

## 2020-01-31 ENCOUNTER — Ambulatory Visit (HOSPITAL_COMMUNITY)
Admission: RE | Admit: 2020-01-31 | Discharge: 2020-01-31 | Disposition: A | Discharge status: Home / Self Care | Payer: Medicare Other | Source: Ambulatory Visit | Attending: Podiatry | Admitting: Podiatry

## 2020-01-31 DIAGNOSIS — M7989 Other specified soft tissue disorders: Secondary | ICD-10-CM | POA: Diagnosis not present

## 2020-01-31 DIAGNOSIS — M79662 Pain in left lower leg: Secondary | ICD-10-CM | POA: Insufficient documentation

## 2020-02-03 ENCOUNTER — Telehealth: Payer: Self-pay

## 2020-02-03 NOTE — Telephone Encounter (Signed)
Pt has been commenting to the nurse about pain in left lower leg. Nurse would like to see if the pt could get something for prn. Pain is 7/10. Please advise.

## 2020-02-04 ENCOUNTER — Telehealth: Payer: Self-pay

## 2020-02-04 NOTE — Telephone Encounter (Signed)
Tylenol '1000mg'$  q6h would be best, he can't take NSAIDs and I'd prefer to not prescribe a narcotic for edema/arthritis pain he's been having. He's on gabapentin managed by his PCP and should discuss with them about further management of his neuropathic pain. Thanks!

## 2020-02-04 NOTE — Telephone Encounter (Signed)
Micheal Walls from [facility] called concerned about pt wound. The wound is much larger and has drainage. Pt is now completing of pain and she would like for this patient to be seen sooner than 02/27/20. Please advise. schedulers ae attached if you would like to get this pt ASAP.

## 2020-02-05 ENCOUNTER — Encounter: Payer: Self-pay | Admitting: Nurse Practitioner

## 2020-02-05 ENCOUNTER — Other Ambulatory Visit: Payer: Self-pay | Admitting: Cardiovascular Disease

## 2020-02-05 ENCOUNTER — Ambulatory Visit (INDEPENDENT_AMBULATORY_CARE_PROVIDER_SITE_OTHER): Payer: Medicare Other | Admitting: Cardiovascular Disease

## 2020-02-05 ENCOUNTER — Telehealth: Payer: Self-pay | Admitting: *Deleted

## 2020-02-05 ENCOUNTER — Ambulatory Visit (INDEPENDENT_AMBULATORY_CARE_PROVIDER_SITE_OTHER): Payer: Medicare Other | Admitting: Nurse Practitioner

## 2020-02-05 ENCOUNTER — Other Ambulatory Visit: Payer: Self-pay

## 2020-02-05 VITALS — BP 120/78 | HR 69 | Temp 98.1°F

## 2020-02-05 VITALS — BP 134/66 | HR 87 | Wt 275.8 lb

## 2020-02-05 DIAGNOSIS — Z89511 Acquired absence of right leg below knee: Secondary | ICD-10-CM

## 2020-02-05 DIAGNOSIS — E1169 Type 2 diabetes mellitus with other specified complication: Secondary | ICD-10-CM | POA: Diagnosis not present

## 2020-02-05 DIAGNOSIS — G4733 Obstructive sleep apnea (adult) (pediatric): Secondary | ICD-10-CM | POA: Diagnosis not present

## 2020-02-05 DIAGNOSIS — N184 Chronic kidney disease, stage 4 (severe): Secondary | ICD-10-CM

## 2020-02-05 DIAGNOSIS — I739 Peripheral vascular disease, unspecified: Secondary | ICD-10-CM | POA: Diagnosis not present

## 2020-02-05 DIAGNOSIS — Z9989 Dependence on other enabling machines and devices: Secondary | ICD-10-CM

## 2020-02-05 DIAGNOSIS — L03116 Cellulitis of left lower limb: Secondary | ICD-10-CM | POA: Diagnosis not present

## 2020-02-05 DIAGNOSIS — S81802A Unspecified open wound, left lower leg, initial encounter: Secondary | ICD-10-CM | POA: Diagnosis not present

## 2020-02-05 DIAGNOSIS — R600 Localized edema: Secondary | ICD-10-CM

## 2020-02-05 DIAGNOSIS — I10 Essential (primary) hypertension: Secondary | ICD-10-CM

## 2020-02-05 DIAGNOSIS — E669 Obesity, unspecified: Secondary | ICD-10-CM | POA: Diagnosis not present

## 2020-02-05 MED ORDER — FUROSEMIDE 20 MG PO TABS: ORAL_TABLET | ORAL | 0 refills | Status: DC

## 2020-02-05 MED ORDER — CEFTRIAXONE SODIUM 500 MG IJ SOLR
500.0000 mg | Freq: Once | INTRAMUSCULAR | Status: AC
  Administered 2020-02-05: 500 mg via INTRAMUSCULAR

## 2020-02-05 NOTE — Telephone Encounter (Signed)
Called pt and care taker @ [facility] to call and schedule appt with mcdonald on friday

## 2020-02-05 NOTE — Telephone Encounter (Signed)
I can see him Friday AM if she's talking about his foot wound. He's developing lymphedema and a leg ulcer as well but that's going to be beyond our ability to treat effectively. I can refer him to the wound care center but it may be a while before they can get him in

## 2020-02-05 NOTE — Patient Instructions (Signed)
Medication Instructions:  CONTINUE WITH CURRENT MEDICATIONS. NO CHANGES.  *If you need a refill on your cardiac medications before your next appointment, please call your pharmacy*    Follow-Up: At [facility], you and your health needs are our priority.  As part of our continuing mission to provide you with exceptional heart care, we have created designated Provider Care Teams.  These Care Teams include your primary Cardiologist (physician) and Advanced Practice Providers (APPs -  Physician Assistants and Nurse Practitioners) who all work together to provide you with the care you need, when you need it.  We recommend signing up for the patient portal called "MyChart".  Sign up information is provided on this After Visit Summary.  MyChart is used to connect with patients for Virtual Visits (Telemedicine).  Patients are able to view lab/test results, encounter notes, upcoming appointments, etc.  Non-urgent messages can be sent to your provider as well.   To learn more about what you can do with MyChart, go to [email or web address].    Your next appointment:   12 month(s)  The format for your next appointment:   In Person  Provider:   Shelva Majestic, MD   New mask: [id number] mask

## 2020-02-05 NOTE — Progress Notes (Signed)
I,Yamilka Roman [employer] as a Education administrator for [employer], [name].,have documented all relevant documentation on the behalf of [name], [name],as directed by  [name], [name] while in the presence of [name], [place].  This visit occurred during the SARS-CoV-2 public health emergency.  Safety protocols were in place, including screening questions prior to the visit, additional usage of staff PPE, and extensive cleaning of exam room while observing appropriate contact time as indicated for disinfecting solutions.  Subjective:     Patient ID: Micheal Walls , male    DOB: 04/20/1936 , 84 y.o.   MRN: [id number]   Chief Complaint  Patient presents with  . Leg Pain    patient stated he has been hurting him    HPI  Here today due to having redness to his left lower extremity and has 2 superficial wounds with dried skin. He feels the are is worse.     Past Medical History:  Diagnosis Date  . Amputated below knee ([place])    right  . Anemia   . Diastolic heart failure ([place])   . Diverticulosis   . DM (diabetes mellitus) ([place])   . Gout   . Hyperlipemia   . OSA on CPAP   . RBBB   . Systemic hypertension      Family History  Problem Relation Age of Onset  . Diabetes Mother   . Heart attack Father   . Cancer Sister      Current Outpatient Medications:  .  allopurinol (ZYLOPRIM) 100 MG tablet, Take 1 tablet (100 mg total) by mouth daily. (Patient taking differently: Take 100 mg by mouth 2 (two) times daily. ), Disp: 90 tablet, Rfl: 1 .  antiseptic oral rinse (BIOTENE) LIQD, 15 mLs by Mouth Rinse route as needed for dry mouth., Disp: , Rfl:  .  aspirin 81 MG tablet, Take 162 mg by mouth daily., Disp: , Rfl:  .  benzonatate (TESSALON PERLES) 100 MG capsule, Take 1 capsule (100 mg total) by mouth every 6 (six) hours as needed., Disp: 30 capsule, Rfl: 1 .  Castellani Paint Modified 1.5 % LIQD, Use a cotton tip applicator. Paint between toes of left foot as needed once daily. (Patient  taking differently: See admin instructions. Use a cotton tip applicator. Paint between toes of left foot once daily.), Disp: 1 Bottle, Rfl: 1 .  colchicine 0.6 MG tablet, Take 0.6 mg by mouth daily. , Disp: , Rfl:  .  diclofenac Sodium (VOLTAREN) 1 % GEL, Apply 2 g topically 4 (four) times daily., Disp: 100 g, Rfl: 2 .  diltiazem (CARDIZEM CD) 300 MG 24 hr capsule, TAKE ONE CAPSULE EACH DAY (Patient taking differently: Take 300 mg by mouth daily. ), Disp: 90 capsule, Rfl: 1 .  dorzolamide (TRUSOPT) 2 % ophthalmic solution, Place 1 drop into both eyes 2 (two) times daily., Disp: , Rfl:  .  ferrous sulfate 325 (65 FE) MG tablet, Take 325 mg by mouth daily with breakfast., Disp: , Rfl:  .  fish oil-omega-3 fatty acids 1000 MG capsule, Take 1 g by mouth 2 (two) times daily. , Disp: , Rfl:  .  furosemide (LASIX) 40 MG tablet, Take 2 tabs in morning and 1 tab evening, Disp: 180 tablet, Rfl: 0 .  gabapentin (NEURONTIN) 100 MG capsule, Take 1 capsule  by mouth 3  times daily. (Patient taking differently: Take 100 mg by mouth 3 (three) times daily. ), Disp: 270 capsule, Rfl: 0 .  glucose blood (FREESTYLE PRECISION  NEO TEST) test strip, Use as instructed, Disp: 100 each, Rfl: 12 .  Glycerin-Polysorbate 80 (REFRESH DRY EYE THERAPY OP), Place 1 drop into both eyes in the morning and at bedtime., Disp: , Rfl:  .  hydrocortisone (ANUSOL-HC) 25 MG suppository, Place 25 mg rectally 2 (two) times daily as needed for hemorrhoids or anal itching., Disp: , Rfl:  .  Insulin Degludec (TRESIBA FLEXTOUCH [place]), Inject 10 mg into the skin at bedtime., Disp: , Rfl:  .  ketotifen (ALAWAY) 0.025 % ophthalmic solution, Place 1 drop into both eyes 2 (two) times daily., Disp: , Rfl:  .  latanoprost (XALATAN) 0.005 % ophthalmic solution, Place 1 drop into both eyes at bedtime. , Disp: , Rfl:  .  losartan (COZAAR) 100 MG tablet, Take 100 mg by mouth at bedtime., Disp: , Rfl:  .  Multiple Vitamin (MULTIVITAMIN) tablet, Take 1 tablet by  mouth daily., Disp: , Rfl:  .  niacin (NIASPAN) 500 MG CR tablet, TAKE 2 TABLETS AT BEDTIME, Disp: 180 tablet, Rfl: 1 .  pantoprazole (PROTONIX) 40 MG tablet, Take 1 tablet  by mouth daily. (Patient taking differently: Take 40 mg by mouth daily. ), Disp: 90 tablet, Rfl: 0 .  pramoxine-hydrocortisone (PRAMOSONE) cream, Apply topically 3 (three) times daily. Apply topically to hemorrhoids four times daily as needed after bowel movements, for external use only, Disp: , Rfl:  .  PRESCRIPTION MEDICATION, See admin instructions. CPAP- At bedtime, Disp: , Rfl:  .  rosuvastatin (CRESTOR) 10 MG tablet, Take 1 tablet (10 mg total) by mouth daily. (Patient taking differently: Take 10 mg by mouth once a week. ), Disp: 90 tablet, Rfl: 1 .  Semaglutide, [name], (OZEMPIC, [name],) 2 MG/1.5ML SOPN, Inject 1 mg into the skin once a week., Disp: 6 pen, Rfl: 1 .  furosemide (LASIX) 20 MG tablet, Take 1 tab midday for 3 days only., Disp: 30 tablet, Rfl: 0   No Known Allergies   Review of Systems  Constitutional: Negative.   Respiratory: Negative.   Cardiovascular: Negative.   Gastrointestinal: Negative.   Skin: Positive for rash.  Psychiatric/Behavioral: Negative.      Today's Vitals   02/05/20 1503  BP: 120/78  Pulse: 69  Temp: 98.1 F (36.7 C)  TempSrc: Oral  PainSc: 0-No pain   There is no height or weight on file to calculate BMI.   Objective:  Physical Exam Constitutional:      Appearance: Normal appearance.  Cardiovascular:     Rate and Rhythm: Normal rate.  Skin:    General: Skin is warm and dry.     Capillary Refill: Capillary refill takes less than 2 seconds.     Comments: Wound superficial present to right lower extremity, no drainage. Also has erythema around his leg  Neurological:     General: No focal deficit present.     Mental Status: He is alert and oriented to person, place, and time.     Cranial Nerves: No cranial nerve deficit.  Psychiatric:        Mood and Affect:  Mood normal.        Behavior: Behavior normal.        Thought Content: Thought content normal.        Judgment: Judgment normal.         Assessment And Plan:     1. Wound of left lower extremity, initial encounter  Healing vascular wound present to left lower extremity  Will refer to pain clinic and  vascular surgery for further evaluation, history of PAD - Ambulatory referral to Pain Clinic - Ambulatory referral to Vascular Surgery  2. Cellulitis of left lower extremity  Erythema to left lower extremity will treat with ceftriaxone - Ambulatory referral to Pain Clinic - cefTRIAXone (ROCEPHIN) injection 500 mg  3. Localized edema  He is to continue his furosemide as directed by nephrology  Encouraged to keep left lower extremity elevated. - Ambulatory referral to Pain Clinic - furosemide (LASIX) 20 MG tablet; Take 1 tab midday for 3 days only.  Dispense: 30 tablet; Refill: 0  4. PAD (peripheral artery disease) ([place]) - Ambulatory referral to Vascular Surgery     Patient was given opportunity to ask questions. Patient verbalized understanding of the plan and was able to repeat key elements of the plan. All questions were answered to their satisfaction.   Teola Bradley, [name], have reviewed all documentation for this visit. The documentation on 02/25/20 for the exam, diagnosis, procedures, and orders are all accurate and complete.   THE PATIENT IS ENCOURAGED TO PRACTICE SOCIAL DISTANCING DUE TO THE COVID-19 PANDEMIC.

## 2020-02-05 NOTE — Telephone Encounter (Signed)
Can he come in the AM? I don't have surgery this Friday so I'm available if you have staff/space for me. In fact if its possible to move more of the afternoon to the AM that would be great if possible. [place] [facility] [place] walkthrough but if I can get out sooner I'll have a very happy fiancee

## 2020-02-05 NOTE — Telephone Encounter (Signed)
I scheduled the pt for an appointment for this Friday 02/07/20 at 2:15 pm.

## 2020-02-05 NOTE — Telephone Encounter (Signed)
Hey we do not have any available space in the office on Friday morning. He has another appointment in the am.

## 2020-02-05 NOTE — Progress Notes (Signed)
Cardiology Office Note    Date:  02/07/2020   ID:  Micheal Walls, DOB September 26, 1935, MRN [id number]  PCP:  [name], [name]  Cardiologist:  Shelva Majestic, MD (sleep); Cr. Croituru  No chief complaint on file.  F/U sleep evaluation  History of Present Illness:  Micheal Walls is a 84 y.o. male who presents to the office today for a 72-monthfollow-up sleep evaluation.  Micheal Walls a history of hypertension, diastolic heart failure, diabetes mellitus, and hyperlipidemia.  In 2011 he was referred for a sleep study due to concerns for obstructive sleep apnea.  At that time, he had loud snoring, witnessed apnea, gasping for breath, and nonrestorative sleep.  A diagnostic polysomnogram done on 09/22/2009 showed moderate obstructive sleep apnea overall with an AHI of 23.46 per hour.  Sleep apnea was severe during rim sleep at 57.39 per hour.  He had significant oxygen desaturation to 82% with non-REM sleep and 50% with rems sleep.  There was moderate snoring.  He underwent CPAP titration and was titrated up to 11 7 m water pressure on 10/19/2009.  He has been utilizing CPAP therapy ever since and admits to 100% compliance with use.  In 2018 his machine  started to intermittently malfunction.  Apparently, on 112/03/2017 the machine completely died and is no longer turns on for CPAP therapy.  His DME company: [facility].  His old machine was arrest Brontex CPAP unit.  His start date was 12/02/2009.  He goes to bed at 11 PM and wakes up at 9 AM.  He has 2-3 episodes of nocturia per night.  He is added onto my schedule in December 2018 since his machine was no longer functional and he was  in need for a new CPAP machine as soon as possible.   When I saw him in December 2018 an Epworth Sleepiness Scale score was was elevated at 14, consistent with daytime sleepiness, particularly since his machine has been nonfunctional. He denied any chest pain, PND orthopnea.  He is  status post right BKA.   He has a history of chronic renal insufficiency.  He received a new ResMed air sense 10 AutoSet CPAP unit on June 02, 2017.  [facility] is his DME company.  Since receiving the new machine, he resumed CPAP therapy with 100% compliance.  I saw him for follow-up evaluation in March 2019.  A download was obtained in the office  from August 07, 2017 through September 05, 2017.  Usage 100% as was usage greater than 4 hours; averaging 7 hours and 23 minutes of CPAP use per night.  AHI  4.  His minimum pressure is set at 8 with a maximum of 20.  Review of his download indicates that he is having significant mask leak which may also be increasing his AHI.  He had been using very old nasal pillows.   I had not seen him since March 2019 although he was seen by [place] February 2020.  I last saw him in July 2020.  He is wheelchair-bound.  He has a past history of CVA, right BKA with diabetes related peripheral vascular disease, hypertension, hyperlipidemia, and is chronic kidney disease followed by Dr. GMoshe Cipro  Presently, he denies any chest pain.  He continues to be compliant CPAP therapy.  [facility] [employer] is now his new DME company who recently purchased [facility].  He typically goes to bed between midnight and 1 AM and wakes up  between 10 and 11 AM.  A download from June 1 through December 11, 2018 was obtained.  Compliance was 93% of usage days the 2 days he did not use treatment was because he had significant nasal irritation from his nasal pillows.  Apparently he has been on a minimum pressure ranging from 8 to a maximum pressure of 20.  95th percentile pressure is 12 with a maximum average pressure of 13.  AHI is 7.4 with an apnea index of 5.5.  He continued to be sleepy during the day.  An Epworth scale was recalculated and this endorsed to 23.  During that evaluation, I changed his CPAP settings and increase his minimal pressure up to 10 and maximum pressure up to 18 cm of water.  I  recommended changing his mask to a ResMed AirFit N 30i which should not cause nasal irritation with the tubing coming from the crown of his head.  His blood pressure at that time was elevated.  Over the past year, Micheal Walls has continued to use CPAP therapy.  [employer] is now his DME company.  I obtained a new download from January 05, 2020 through February 03, 2020.  He is meeting compliance standards with average usage at 6 hours and 35 minutes.  Apparently his settings are now at a minimum of 7 with a maximum of 14 and is 95th percentile pressure was 10.8.  AHI is 3.7.  Typically goes to bed between 11 and midnight and wakes up around 9 AM.  Sometimes he gets up at 7 AM with home care.  He presents for evaluation.  Past Medical History:  Diagnosis Date  . Amputated below knee ([place])    right  . Anemia   . Diastolic heart failure ([place])   . Diverticulosis   . DM (diabetes mellitus) ([place])   . Gout   . Hyperlipemia   . OSA on CPAP   . RBBB   . Systemic hypertension     Past Surgical History:  Procedure Laterality Date  . LEG AMPUTATION BELOW KNEE     right  . NM MYOCAR PERF WALL MOTION  09/18/2009   no ischemia    Current Medications: Outpatient Medications Prior to Visit  Medication Sig Dispense Refill  . allopurinol (ZYLOPRIM) 100 MG tablet Take 1 tablet (100 mg total) by mouth daily. (Patient taking differently: Take 100 mg by mouth 2 (two) times daily. ) 90 tablet 1  . antiseptic oral rinse (BIOTENE) LIQD 15 mLs by Mouth Rinse route as needed for dry mouth.    Marland Kitchen aspirin 81 MG tablet Take 162 mg by mouth daily.    . benzonatate (TESSALON PERLES) 100 MG capsule Take 1 capsule (100 mg total) by mouth every 6 (six) hours as needed. 30 capsule 1  . Castellani Paint Modified 1.5 % LIQD Use a cotton tip applicator. Paint between toes of left foot as needed once daily. (Patient taking differently: See admin instructions. Use a cotton tip applicator. Paint between toes of left foot once daily.) 1  Bottle 1  . colchicine 0.6 MG tablet Take 0.6 mg by mouth daily.     . diclofenac Sodium (VOLTAREN) 1 % GEL Apply 2 g topically 4 (four) times daily. 100 g 2  . diltiazem (CARDIZEM CD) 300 MG 24 hr capsule TAKE ONE CAPSULE EACH DAY (Patient taking differently: Take 300 mg by mouth daily. ) 90 capsule 1  . dorzolamide (TRUSOPT) 2 % ophthalmic solution Place 1 drop into both eyes 2 (  two) times daily.    . ferrous sulfate 325 (65 FE) MG tablet Take 325 mg by mouth daily with breakfast.    . fish oil-omega-3 fatty acids 1000 MG capsule Take 1 g by mouth 2 (two) times daily.     . furosemide (LASIX) 40 MG tablet Take 2 tabs in morning and 1 tab evening 180 tablet 0  . gabapentin (NEURONTIN) 100 MG capsule Take 1 capsule  by mouth 3  times daily. (Patient taking differently: Take 100 mg by mouth 3 (three) times daily. ) 270 capsule 0  . glucose blood (FREESTYLE PRECISION NEO TEST) test strip Use as instructed 100 each 12  . Glycerin-Polysorbate 80 (REFRESH DRY EYE THERAPY OP) Place 1 drop into both eyes in the morning and at bedtime.    . hydrocortisone (ANUSOL-HC) 25 MG suppository Place 25 mg rectally 2 (two) times daily as needed for hemorrhoids or anal itching.    . Insulin Degludec (TRESIBA FLEXTOUCH [place]) Inject 10 mg into the skin at bedtime.    Marland Kitchen ketotifen (ALAWAY) 0.025 % ophthalmic solution Place 1 drop into both eyes 2 (two) times daily.    Marland Kitchen latanoprost (XALATAN) 0.005 % ophthalmic solution Place 1 drop into both eyes at bedtime.     Marland Kitchen losartan (COZAAR) 100 MG tablet Take 100 mg by mouth at bedtime.    . Multiple Vitamin (MULTIVITAMIN) tablet Take 1 tablet by mouth daily.    . niacin (NIASPAN) 500 MG CR tablet TAKE 2 TABLETS AT BEDTIME 180 tablet 1  . pantoprazole (PROTONIX) 40 MG tablet Take 1 tablet  by mouth daily. (Patient taking differently: Take 40 mg by mouth daily. ) 90 tablet 0  . pramoxine-hydrocortisone (PRAMOSONE) cream Apply topically 3 (three) times daily. Apply topically to  hemorrhoids four times daily as needed after bowel movements, for external use only    . PRESCRIPTION MEDICATION See admin instructions. CPAP- At bedtime    . rosuvastatin (CRESTOR) 10 MG tablet Take 1 tablet (10 mg total) by mouth daily. (Patient taking differently: Take 10 mg by mouth once a week. ) 90 tablet 1  . Semaglutide, [name], (OZEMPIC, [name],) 2 MG/1.5ML SOPN Inject 1 mg into the skin once a week. 6 pen 1   No facility-administered medications prior to visit.     Allergies:   Patient has no known allergies.   Social History   Socioeconomic History  . Marital status: Married    Spouse name: Not on file  . Number of children: Not on file  . Years of education: Not on file  . Highest education level: Not on file  Occupational History  . Occupation: retired  Tobacco Use  . Smoking status: Former Smoker    Types: Cigars    Quit date: 06/12/2001    Years since quitting: 18.6  . Smokeless tobacco: Never Used  Vaping Use  . Vaping Use: Former  Substance and Sexual Activity  . Alcohol use: No    Alcohol/week: 0.0 standard drinks  . Drug use: No  . Sexual activity: Not Currently  Other Topics Concern  . Not on file  Social History Narrative  . Not on file   Social Determinants of Health   Financial Resource Strain:   . Difficulty of Paying Living Expenses: Not on file  Food Insecurity:   . Worried About Charity fundraiser in the Last Year: Not on file  . Ran Out of Food in the Last Year: Not on file  Transportation  Needs:   . Lack of Transportation (Medical): Not on file  . Lack of Transportation (Non-Medical): Not on file  Physical Activity: Insufficiently Active  . Days of Exercise per Week: 2 days  . Minutes of Exercise per Session: 10 min  Stress:   . Feeling of Stress : Not on file  Social Connections:   . Frequency of Communication with Friends and Family: Not on file  . Frequency of Social Gatherings with Friends and Family: Not on file  .  Attends Religious Services: Not on file  . Active Member of Clubs or Organizations: Not on file  . Attends Archivist Meetings: Not on file  . Marital Status: Not on file     Family History:  The patient's family history includes Cancer in his sister; Diabetes in his mother; Heart attack in his father.   ROS General: Negative; No fevers, chills, or night sweats;  HEENT: Negative; No changes in vision or hearing, sinus congestion, difficulty swallowing Pulmonary: Recent pneumonia in May 2021 Cardiovascular: Negative; No chest pain, presyncope, syncope, palpitations GI: Negative; No nausea, vomiting, diarrhea, or abdominal pain GU: Negative; No dysuria, hematuria, or difficulty voiding Musculoskeletal: Positive for right below the knee amputation Hematologic/Oncology: Negative; no easy bruising, bleeding Endocrine: Positive for diabetes mellitus Neuro: Negative; no changes in balance, headaches Skin: Negative; No rashes or skin lesions Psychiatric: Negative; No behavioral problems, depression Sleep: Positive for OSA, snoring, daytime sleepiness, hypersomnolence; No bruxism, restless legs, hypnogognic hallucinations, no cataplexy Other comprehensive 14 point system review is negative.   PHYSICAL EXAM:   VS:  BP 134/66   Pulse 87   Wt 275 lb 12.8 oz (125.1 kg)   SpO2 98%   BMI 36.39 kg/m     Repeat blood pressure was 130/62  Wt Readings from Last 3 Encounters:  02/07/20 275 lb (124.7 kg)  02/05/20 275 lb 12.8 oz (125.1 kg)  01/20/20 278 lb 3.5 oz (126.2 kg)   General: Alert, oriented, no distress.  Skin: normal turgor, no rashes, warm and dry HEENT: Normocephalic, atraumatic. Pupils equal round and reactive to light; sclera anicteric; extraocular muscles intact;  Nose without nasal septal hypertrophy Mouth/Parynx benign; Mallinpatti scale 4 Neck: No JVD, no carotid bruits; normal carotid upstroke Lungs: clear to ausculatation and percussion; no wheezing or  rales Chest wall: without tenderness to palpitation Heart: PMI not displaced, RRR, s1 s2 normal, 1/6 systolic murmur, no diastolic murmur, no rubs, gallops, thrills, or heaves Abdomen: soft, nontender; no hepatosplenomehaly, BS+; abdominal aorta nontender and not dilated by palpation. Back: no CVA tenderness Pulses 2+ Musculoskeletal: Right BKA;  full range of motion, normal strength, no joint deformities Extremities: Left leg swelling;no clubbing cyanosis , Homan's sign negative  Neurologic: grossly nonfocal; Cranial nerves grossly wnl Psychologic: Normal mood and affect    Studies/Labs Reviewed:   July 2020 ECG (independently read by me): Normal sinus rhythm 85 bpm, first-degree AV block with a PR interval 236 ms.  Right bundle branch block with repolarization changes.  Isolated PVC.  September 07, 2017 ECG (independently read by me): Normal sinus rhythm at 73 bpm.  Right bundle branch block with repolarization changes.  First-degree AV block with a PR interval at 276 ms.  Recent Labs: BMP Latest Ref Rng & Units 01/23/2020 01/19/2020 01/18/2020  Glucose 65 - 99 mg/dL 141(H) 166(H) 145(H)  BUN 8 - 27 mg/dL 35(H) 39(H) 49(H)  Creatinine 0.76 - 1.27 mg/dL 1.97(H) 1.80(H) 2.16(H)  BUN/Creat Ratio 10 - 24 18 - -  Sodium 134 - 144 mmol/L 136 140 136  Potassium 3.5 - 5.2 mmol/L 4.3 4.0 3.9  Chloride 96 - 106 mmol/L 101 105 104  CO2 20 - 29 mmol/L 24 23 24   Calcium 8.6 - 10.2 mg/dL 10.3(H) 9.5 9.4     Hepatic Function Latest Ref Rng & Units 01/17/2020 01/16/2020 11/02/2019  Total Protein 6.5 - 8.1 g/dL 6.8 7.0 6.1(L)  Albumin 3.5 - 5.0 g/dL 3.2(L) 3.7 3.0(L)  AST 15 - 41 U/L 26 29 44(H)  ALT 0 - 44 U/L 20 23 23   Alk Phosphatase 38 - 126 U/L 61 66 47  Total Bilirubin 0.3 - 1.2 mg/dL 0.9 0.8 0.9  Bilirubin, Direct 0.00 - 0.40 mg/dL - - -    CBC Latest Ref Rng & Units 01/23/2020 01/19/2020 01/18/2020  WBC 3.4 - 10.8 x10E3/uL 5.8 9.4 12.7(H)  Hemoglobin 13.0 - 17.7 g/dL 10.8(L) 10.3(L) 10.0(L)   Hematocrit 37.5 - 51.0 % 32.8(L) 31.4(L) 31.1(L)  Platelets 150 - 450 x10E3/uL 146(L) 161 122(L)   Lab Results  Component Value Date   MCV 91 01/23/2020   MCV 95.2 01/19/2020   MCV 94.8 01/18/2020   Lab Results  Component Value Date   TSH 2.340 01/23/2020   Lab Results  Component Value Date   HGBA1C 6.8 (H) 01/23/2020     BNP No results found for: BNP  ProBNP    Component Value Date/Time   PROBNP 197.2 (H) 01/09/2011 0745     Lipid Panel     Component Value Date/Time   CHOL 138 09/24/2019 0936   TRIG 96 09/24/2019 0936   HDL 35 (L) 09/24/2019 0936   CHOLHDL 3.9 09/24/2019 0936   LDLCALC 85 09/24/2019 0936     RADIOLOGY: DG Chest Port 1 View  Result Date: 01/16/2020 CLINICAL DATA:  Altered mental status. EXAM: PORTABLE CHEST 1 VIEW COMPARISON:  December 17, 2019 FINDINGS: There is no evidence of acute infiltrate, pleural effusion or pneumothorax. The heart size and mediastinal contours are within normal limits. There is mild to moderate severity calcification of the aortic arch. The visualized skeletal structures are unremarkable. IMPRESSION: No active disease. Electronically Signed   By: Virgina Norfolk M.D.   On: 01/16/2020 18:52   DG Foot 2 Views Left  Result Date: 01/16/2020 CLINICAL DATA:  Left foot swelling and ulcers. EXAM: LEFT FOOT - 2 VIEW COMPARISON:  Foot radiograph 11/02/2019 FINDINGS: Prominent dorsal soft tissue edema that is progressed from prior exam. No visualized deep soft tissue ulcer. No bony destruction or periosteal reaction to suggest osteomyelitis. Unchanged mid and hindfoot osteoarthritis and plantar calcaneal spur. No osseous erosions. Advanced vascular calcifications. IMPRESSION: 1. Prominent dorsal soft tissue edema that has progressed from radiographs and may. No visualized deep soft tissue ulcer. No evidence of osteomyelitis. 2. Unchanged mid and hindfoot osteoarthritis. 3. Advanced vascular calcifications. Electronically Signed   By: Keith Rake M.D.   On: 01/16/2020 21:17   VAS [place] LOWER EXTREMITY VENOUS (DVT)  Result Date: 01/31/2020  Lower Venous DVTStudy Indications: Pain, and Swelling. Other Indications: Patient complains of left calf swelling for seven months. He                    states over the past few weeks his calf has been more tender.                    He denies any shortness of breath. Risk Factors: None identified. Performing Technologist: Wilkie Aye RVT  Examination  Guidelines: A complete evaluation includes B-mode imaging, spectral Doppler, color Doppler, and power Doppler as needed of all accessible portions of each vessel. Bilateral testing is considered an integral part of a complete examination. Limited examinations for reoccurring indications may be performed as noted. The reflux portion of the exam is performed with the patient in reverse Trendelenburg.  +-----+---------------+---------+-----------+----------+--------------+ RIGHTCompressibilityPhasicitySpontaneityPropertiesThrombus Aging +-----+---------------+---------+-----------+----------+--------------+ CFV  Full           Yes      Yes                                 +-----+---------------+---------+-----------+----------+--------------+   +---------+---------------+---------+-----------+----------+--------------+ LEFT     CompressibilityPhasicitySpontaneityPropertiesThrombus Aging +---------+---------------+---------+-----------+----------+--------------+ CFV      Full           Yes      Yes                                 +---------+---------------+---------+-----------+----------+--------------+ SFJ      Full           Yes      Yes                                 +---------+---------------+---------+-----------+----------+--------------+ FV Prox  Full           Yes      Yes                                 +---------+---------------+---------+-----------+----------+--------------+ FV Mid   Full           Yes      Yes                                  +---------+---------------+---------+-----------+----------+--------------+ FV DistalFull           Yes      Yes                                 +---------+---------------+---------+-----------+----------+--------------+ PFV      Full                                                        +---------+---------------+---------+-----------+----------+--------------+ POP      Full           Yes      Yes                                 +---------+---------------+---------+-----------+----------+--------------+ PTV      Full           Yes      Yes                                 +---------+---------------+---------+-----------+----------+--------------+ PERO     Full           Yes  Yes                                 +---------+---------------+---------+-----------+----------+--------------+ Soleal                           Yes                                 +---------+---------------+---------+-----------+----------+--------------+ Gastroc  Full                                                        +---------+---------------+---------+-----------+----------+--------------+ GSV      Full           Yes      Yes                                 +---------+---------------+---------+-----------+----------+--------------+  Summary: RIGHT: - No evidence of common femoral vein obstruction.  LEFT: - No evidence of deep vein thrombosis in the lower extremity. No indirect evidence of obstruction proximal to the inguinal ligament. - No cystic structure found in the popliteal fossa. Interstitial fluid throughout the calf.  *See table(s) above for measurements and observations. Electronically signed by Quay Burow MD on 01/31/2020 at 12:36:20 PM.    Final      Additional studies/ records that were reviewed today include:  I reviewed the patient's diagnostic polysomnogram and CPAP titration trials from 2011.  I reviewed office records  from Dr. Sallyanne Kuster.    Download was obtained from November 12, 2018 through December 11, 2018  ASSESSMENT:    1. OSA (obstructive sleep apnea)   2. Essential hypertension   3. Diabetes mellitus type 2 in obese ([place])   4. Hx of right BKA ([place])   5. Chronic kidney disease (CKD), stage IV (severe) ([facility])   6. Leg edema     PLAN:  Micheal Walls is a 84 year old African-American male who has significant cardiovascular comorbidities including systemic hypertension, diastolic heart failure, diabetes mellitus, hyperlipidemia, and is status post right BKA amputation.  He is been documented to have obstructive sleep apnea since his initial diagnostic polysomnogram in April 2011.  He has been on CPAP therapy since his set up date on 12/02/2009.  On his initial evaluation, his overall AHI was 23.46/h but during REM sleep he had severe sleep apnea with an AHI of 57.39/h.  He had significant oxygen desaturation to a nadir of 50% during REM sleep.  After his initial machine had malfunctioned, he received a new ResMed air sense 10 AutoSet CPAP unit with set up date June 02, 2017.  He noticed marked improvement with his new machine compared to his old one.  He continues to feel significantly improved since initiating therapy.  He has been using nasal pillows but sometimes this creates significant irritation to his nares and on 2 days over the past month he was unable to use therapy.  Past year, he has continued to use CPAP therapy and is meeting compliance standards.  Current AHI is 3.7 and apparently he has been at a minimum pressure of 7 with maximum pressure 14 but  his 95th percentile pressure was 10.8 with maximum average pressure of 11.8.  I have changed his settings to a minimum pressure of 9 and maximum pressure of 16.  I have recommended a new mask with a written prescription for ResMed air fit N 30i.  He still has residual daytime sleepiness and I recalculated Epworth scale was still elevated at 18.  I  discussed the importance of improved sleep duration with ideal CPAP use 8 hours per night.  His blood pressure today on repeat by me is well controlled on his regimen consisting of diltiazem 300 mg, furosemide and losartan 100 mg daily.  Apparently has been taking furosemide 80 mg in the morning and 40 mg at night.  I have recommended he take change his afternoon dose to 4 PM to reduce evening nocturia.  He continues to be on rosuvastatin for hyperlipidemia.  He is diabetic on therapy.  He will follow up with Dr. Sallyanne Kuster for his primary cardiology care.  I will see him in 1 year for follow-up sleep evaluation or sooner as needed.    Evaluate medication Adjustments/Labs and Tests Ordered: Current medicines are reviewed at length with the patient today.  Concerns regarding medicines are outlined above.  Medication changes, Labs and Tests ordered today are listed in the Patient Instructions below. Patient Instructions  Medication Instructions:  CONTINUE WITH CURRENT MEDICATIONS. NO CHANGES.  *If you need a refill on your cardiac medications before your next appointment, please call your pharmacy*    Follow-Up: At [facility], you and your health needs are our priority.  As part of our continuing mission to provide you with exceptional heart care, we have created designated Provider Care Teams.  These Care Teams include your primary Cardiologist (physician) and Advanced Practice Providers (APPs -  Physician Assistants and Nurse Practitioners) who all work together to provide you with the care you need, when you need it.  We recommend signing up for the patient portal called "MyChart".  Sign up information is provided on this After Visit Summary.  MyChart is used to connect with patients for Virtual Visits (Telemedicine).  Patients are able to view lab/test results, encounter notes, upcoming appointments, etc.  Non-urgent messages can be sent to your provider as well.   To learn more about what you  can do with MyChart, go to [email or web address].    Your next appointment:   12 month(s)  The format for your next appointment:   In Person  Provider:   Shelva Majestic, MD   New mask: N30i mask     Signed, Shelva Majestic, MD  02/07/2020 6:31 PM    [facility] [address] [place], [place], [place]  [address] Phone: [phone]

## 2020-02-05 NOTE — Patient Instructions (Addendum)
You can purchase normal saline at the store if needed and clean around the wound

## 2020-02-05 NOTE — Telephone Encounter (Signed)
Order for ResMed Airfit N30i mask ordered to [employer] via Epic per Dr Claiborne Billings. Community message sent to [employer] and [place] New order submitted.

## 2020-02-07 ENCOUNTER — Encounter: Payer: Self-pay | Admitting: Cardiovascular Disease

## 2020-02-07 ENCOUNTER — Ambulatory Visit (INDEPENDENT_AMBULATORY_CARE_PROVIDER_SITE_OTHER): Payer: Medicare Other | Admitting: Cardiovascular Disease

## 2020-02-07 ENCOUNTER — Telehealth: Payer: Self-pay | Admitting: Podiatry

## 2020-02-07 ENCOUNTER — Other Ambulatory Visit: Payer: Self-pay

## 2020-02-07 ENCOUNTER — Ambulatory Visit (INDEPENDENT_AMBULATORY_CARE_PROVIDER_SITE_OTHER): Payer: Medicare Other | Admitting: Podiatry

## 2020-02-07 VITALS — BP 129/69 | HR 80 | Ht 73.0 in | Wt 275.0 lb

## 2020-02-07 DIAGNOSIS — N184 Chronic kidney disease, stage 4 (severe): Secondary | ICD-10-CM | POA: Diagnosis not present

## 2020-02-07 DIAGNOSIS — M79662 Pain in left lower leg: Secondary | ICD-10-CM | POA: Diagnosis not present

## 2020-02-07 DIAGNOSIS — E1169 Type 2 diabetes mellitus with other specified complication: Secondary | ICD-10-CM

## 2020-02-07 DIAGNOSIS — E1142 Type 2 diabetes mellitus with diabetic polyneuropathy: Secondary | ICD-10-CM | POA: Diagnosis not present

## 2020-02-07 DIAGNOSIS — I739 Peripheral vascular disease, unspecified: Secondary | ICD-10-CM | POA: Diagnosis not present

## 2020-02-07 DIAGNOSIS — Z9989 Dependence on other enabling machines and devices: Secondary | ICD-10-CM | POA: Diagnosis not present

## 2020-02-07 DIAGNOSIS — Z6835 Body mass index (BMI) 35.0-35.9, adult: Secondary | ICD-10-CM

## 2020-02-07 DIAGNOSIS — L98499 Non-pressure chronic ulcer of skin of other sites with unspecified severity: Secondary | ICD-10-CM | POA: Diagnosis not present

## 2020-02-07 DIAGNOSIS — E11621 Type 2 diabetes mellitus with foot ulcer: Secondary | ICD-10-CM | POA: Diagnosis not present

## 2020-02-07 DIAGNOSIS — G4733 Obstructive sleep apnea (adult) (pediatric): Secondary | ICD-10-CM

## 2020-02-07 DIAGNOSIS — L97529 Non-pressure chronic ulcer of other part of left foot with unspecified severity: Secondary | ICD-10-CM

## 2020-02-07 DIAGNOSIS — I890 Lymphedema, not elsewhere classified: Secondary | ICD-10-CM | POA: Diagnosis not present

## 2020-02-07 DIAGNOSIS — E785 Hyperlipidemia, unspecified: Secondary | ICD-10-CM

## 2020-02-07 DIAGNOSIS — M7989 Other specified soft tissue disorders: Secondary | ICD-10-CM | POA: Diagnosis not present

## 2020-02-07 DIAGNOSIS — Z89519 Acquired absence of unspecified leg below knee: Secondary | ICD-10-CM | POA: Diagnosis not present

## 2020-02-07 DIAGNOSIS — I10 Essential (primary) hypertension: Secondary | ICD-10-CM | POA: Diagnosis not present

## 2020-02-07 DIAGNOSIS — E669 Obesity, unspecified: Secondary | ICD-10-CM

## 2020-02-07 NOTE — Patient Instructions (Signed)
Medication Instructions:  No changes *If you need a refill on your cardiac medications before your next appointment, please call your pharmacy*   Lab Work: None ordered If you have labs (blood work) drawn today and your tests are completely normal, you will receive your results only by: Marland Kitchen MyChart Message (if you have MyChart) OR . A paper copy in the mail If you have any lab test that is abnormal or we need to change your treatment, we will call you to review the results.   Testing/Procedures: None ordered   Follow-Up: At [facility], you and your health needs are our priority.  As part of our continuing mission to provide you with exceptional heart care, we have created designated Provider Care Teams.  These Care Teams include your primary Cardiologist (physician) and Advanced Practice Providers (APPs -  Physician Assistants and Nurse Practitioners) who all work together to provide you with the care you need, when you need it.  We recommend signing up for the patient portal called "MyChart".  Sign up information is provided on this After Visit Summary.  MyChart is used to connect with patients for Virtual Visits (Telemedicine).  Patients are able to view lab/test results, encounter notes, upcoming appointments, etc.  Non-urgent messages can be sent to your provider as well.   To learn more about what you can do with MyChart, go to [email or web address].    Your next appointment:   12 month(s)  The format for your next appointment:   In Person  Provider:   You may see [name], MD or one of the following Advanced Practice Providers on your designated Care Team:    Almyra Deforest, PA-C  Fabian Sharp, [place] or   Roby Lofts, [place]    Other Instructions A referral has been placed to the [facility]. They will be in touch to make the appointment.

## 2020-02-07 NOTE — Patient Instructions (Addendum)
Keep the leg elevated as much as possible. Wear the ACE wrap when out and about or the leg is hanging down  Go to your wound care appointment on 02/28/20. They will be able to control the swelling best  Monitor for signs of infection of the foot or the leg

## 2020-02-07 NOTE — Progress Notes (Signed)
Patient ID: Micheal Walls, male   DOB: [birth year], 84 y.o.   MRN: [id number]    Cardiology Office Note    Date:  02/09/2020   ID:  Micheal Walls, DOB Jun 13, 1936, MRN [id number]  PCP:  [name], [name]  Cardiologist:   [name], MD   Chief complaint: Edema   History of Present Illness:  Micheal Walls is a 84 y.o. male with a history of previous stroke, right below the knee amputation for diabetes-related peripheral arterial disease, insulin-requiring type 2 diabetes mellitus, hyperlipidemia, hypertension and chronic kidney disease as well as obstructive sleep apnea on chronic CPAP.  He was hospitalized from August 5 to August 9 due to cellulitis of the left lower extremity.  He has a couple of large nonhealing ulcers on the pretibial area, one anterior, above the medial malleolus.  His blood cultures actually grew Pseudomonas.  He has completed antibiotic therapy.  He continues to have 3+ edema of the left lower extremity (amputation on the right).    He denies any problems with shortness of breath at rest or with activity, orthopnea, PND, abdominal distention, palpitations, syncope, dizziness, chest pain at rest or with activity.  He saw Dr. Claiborne Billings in clinic on August 25. He reports 100% compliance with CPAP and he denies daytime hypersomnolence.    Although he does have a prosthesis, he remains very sedentary and is severely obese. He is now seeing Dr. Moshe Cipro at [facility].  His most recent creatinine was 1.97 on August 12, after discharge from the hospital.    Glycemic control is good with a hemoglobin A1c of 6.8 %.  His LDL cholesterol is in desirable range, but he continues have a low HDL at 35.  He has never had a cardiac catheterization. By noninvasive imaging has no evidence of nuclear perfusion abnormalities and has a normal left ventricular systolic function. Heart failure has not been an issue to date. He has chronic problems with iron deficiency anemia  and has previously undergone upper endoscopy, colonoscopy and capsule enteroscopy without a clear source of bleeding other than some degree of right-sided colonic diverticulosis.  His renal dysfunction and anemia have been a disincentive to perform any angiography based procedures.   Past Medical History:  Diagnosis Date  . Amputated below knee ([place])    right  . Anemia   . Diastolic heart failure ([place])   . Diverticulosis   . DM (diabetes mellitus) ([place])   . Gout   . Hyperlipemia   . OSA on CPAP   . RBBB   . Systemic hypertension     Past Surgical History:  Procedure Laterality Date  . LEG AMPUTATION BELOW KNEE     right  . NM MYOCAR PERF WALL MOTION  09/18/2009   no ischemia    Current Medications: Outpatient Medications Prior to Visit  Medication Sig Dispense Refill  . allopurinol (ZYLOPRIM) 100 MG tablet Take 1 tablet (100 mg total) by mouth daily. (Patient taking differently: Take 100 mg by mouth 2 (two) times daily. ) 90 tablet 1  . antiseptic oral rinse (BIOTENE) LIQD 15 mLs by Mouth Rinse route as needed for dry mouth.    Marland Kitchen aspirin 81 MG tablet Take 162 mg by mouth daily.    . benzonatate (TESSALON PERLES) 100 MG capsule Take 1 capsule (100 mg total) by mouth every 6 (six) hours as needed. 30 capsule 1  . Castellani Paint Modified 1.5 % LIQD Use a cotton tip applicator. Paint between toes  of left foot as needed once daily. (Patient taking differently: See admin instructions. Use a cotton tip applicator. Paint between toes of left foot once daily.) 1 Bottle 1  . colchicine 0.6 MG tablet Take 0.6 mg by mouth daily.     . diclofenac Sodium (VOLTAREN) 1 % GEL Apply 2 g topically 4 (four) times daily. 100 g 2  . diltiazem (CARDIZEM CD) 300 MG 24 hr capsule TAKE ONE CAPSULE EACH DAY (Patient taking differently: Take 300 mg by mouth daily. ) 90 capsule 1  . dorzolamide (TRUSOPT) 2 % ophthalmic solution Place 1 drop into both eyes 2 (two) times daily.    . ferrous sulfate 325 (65  FE) MG tablet Take 325 mg by mouth daily with breakfast.    . fish oil-omega-3 fatty acids 1000 MG capsule Take 1 g by mouth 2 (two) times daily.     . furosemide (LASIX) 20 MG tablet Take 1 tab midday for 3 days only. 30 tablet 0  . furosemide (LASIX) 40 MG tablet Take 2 tabs in morning and 1 tab evening 180 tablet 0  . gabapentin (NEURONTIN) 100 MG capsule Take 1 capsule  by mouth 3  times daily. (Patient taking differently: Take 100 mg by mouth 3 (three) times daily. ) 270 capsule 0  . glucose blood (FREESTYLE PRECISION NEO TEST) test strip Use as instructed 100 each 12  . Glycerin-Polysorbate 80 (REFRESH DRY EYE THERAPY OP) Place 1 drop into both eyes in the morning and at bedtime.    . hydrocortisone (ANUSOL-HC) 25 MG suppository Place 25 mg rectally 2 (two) times daily as needed for hemorrhoids or anal itching.    . Insulin Degludec (TRESIBA FLEXTOUCH [place]) Inject 10 mg into the skin at bedtime.    Marland Kitchen ketotifen (ALAWAY) 0.025 % ophthalmic solution Place 1 drop into both eyes 2 (two) times daily.    Marland Kitchen latanoprost (XALATAN) 0.005 % ophthalmic solution Place 1 drop into both eyes at bedtime.     Marland Kitchen losartan (COZAAR) 100 MG tablet Take 100 mg by mouth at bedtime.    . Multiple Vitamin (MULTIVITAMIN) tablet Take 1 tablet by mouth daily.    . niacin (NIASPAN) 500 MG CR tablet TAKE 2 TABLETS AT BEDTIME 180 tablet 1  . pantoprazole (PROTONIX) 40 MG tablet Take 1 tablet  by mouth daily. (Patient taking differently: Take 40 mg by mouth daily. ) 90 tablet 0  . pramoxine-hydrocortisone (PRAMOSONE) cream Apply topically 3 (three) times daily. Apply topically to hemorrhoids four times daily as needed after bowel movements, for external use only    . PRESCRIPTION MEDICATION See admin instructions. CPAP- At bedtime    . rosuvastatin (CRESTOR) 10 MG tablet Take 1 tablet (10 mg total) by mouth daily. (Patient taking differently: Take 10 mg by mouth once a week. ) 90 tablet 1  . Semaglutide, [name], (OZEMPIC, 1  MG/DOSE,) 2 MG/1.5ML SOPN Inject 1 mg into the skin once a week. 6 pen 1   No facility-administered medications prior to visit.     Allergies:   Patient has no known allergies.   Social History   Socioeconomic History  . Marital status: Married    Spouse name: Not on file  . Number of children: Not on file  . Years of education: Not on file  . Highest education level: Not on file  Occupational History  . Occupation: retired  Tobacco Use  . Smoking status: Former Smoker    Types: Cigars    Quit  date: 06/12/2001    Years since quitting: 18.6  . Smokeless tobacco: Never Used  Vaping Use  . Vaping Use: Former  Substance and Sexual Activity  . Alcohol use: No    Alcohol/week: 0.0 standard drinks  . Drug use: No  . Sexual activity: Not Currently  Other Topics Concern  . Not on file  Social History Narrative  . Not on file   Social Determinants of Health   Financial Resource Strain:   . Difficulty of Paying Living Expenses: Not on file  Food Insecurity:   . Worried About Programme researcher, broadcasting/film/video in the Last Year: Not on file  . Ran Out of Food in the Last Year: Not on file  Transportation Needs:   . Lack of Transportation (Medical): Not on file  . Lack of Transportation (Non-Medical): Not on file  Physical Activity: Insufficiently Active  . Days of Exercise per Week: 2 days  . Minutes of Exercise per Session: 10 min  Stress:   . Feeling of Stress : Not on file  Social Connections:   . Frequency of Communication with Friends and Family: Not on file  . Frequency of Social Gatherings with Friends and Family: Not on file  . Attends Religious Services: Not on file  . Active Member of Clubs or Organizations: Not on file  . Attends Banker Meetings: Not on file  . Marital Status: Not on file     Family History:  The patient's family history includes Cancer in his sister; Diabetes in his mother; Heart attack in his father.   ROS:   Please see the history of  present illness.    ROS  All other systems are reviewed and are negative.   PHYSICAL EXAM:   VS:  BP 129/69   Pulse 80   Ht 6\' 1"  (1.854 m)   Wt 275 lb (124.7 kg)   SpO2 99%   BMI 36.28 kg/m      General: Alert, oriented x3, no distress, obese Head: no evidence of trauma, PERRL, EOMI, no exophtalmos or lid lag, no myxedema, no xanthelasma; normal ears, nose and oropharynx Neck: normal jugular venous pulsations and no hepatojugular reflux; brisk carotid pulses without delay and no carotid bruits Chest: clear to auscultation, no signs of consolidation by percussion or palpation, normal fremitus, symmetrical and full respiratory excursions Cardiovascular: normal position and quality of the apical impulse, regular rhythm, normal first and widely split second heart sounds, no murmurs, rubs or gallops Abdomen: no tenderness or distention, no masses by palpation, no abnormal pulsatility or arterial bruits, normal bowel sounds, no hepatosplenomegaly Extremities: s/p R bKA with prosthesis, 3+ pitting edema of the left lower extremity almost to the knee, 2 large superficial ulcerations with scabbing crusting, each about 7-8 cm in diameter, 1 just above the medial malleolus, the other one anterior just above the ankle joint Neurological: grossly nonfocal Psych: Normal mood and affect  Wt Readings from Last 3 Encounters:  02/07/20 275 lb (124.7 kg)  02/05/20 275 lb 12.8 oz (125.1 kg)  01/20/20 278 lb 3.5 oz (126.2 kg)      Studies/Labs Reviewed:   EKG:  EKG is ordered today.  It has not changed from previous tracings.  Show sinus rhythm with first-degree AV block, right bundle branch block is old.  No ischemic repolarization abnormalities  Arterial duplex of the left lower extremity October 10, 2018:  Unable to obtain ABI (noncompressible vessels) Left: Patent common femoral, superficial femoral, profunda femoral, and  popliteal arteries without evidence of stenosis.  Tibial vessels  difficult to identify and evaluate due to edema, however,  numerous collateralls noted throughout the calf, suggesting tibial  occlusive disease.   Recent Labs: Lipid Panel     Component Value Date/Time   CHOL 138 09/24/2019 0936   TRIG 96 09/24/2019 0936   HDL 35 (L) 09/24/2019 0936   CHOLHDL 3.9 09/24/2019 0936   LDLCALC 85 09/24/2019 0936   LABVLDL 18 09/24/2019 0936   BMET    Component Value Date/Time   NA 136 01/23/2020 1454   K 4.3 01/23/2020 1454   CL 101 01/23/2020 1454   CO2 24 01/23/2020 1454   GLUCOSE 141 (H) 01/23/2020 1454   GLUCOSE 166 (H) 01/19/2020 0925   BUN 35 (H) 01/23/2020 1454   CREATININE 1.97 (H) 01/23/2020 1454   CALCIUM 10.3 (H) 01/23/2020 1454   GFRNONAA 31 (L) 01/23/2020 1454   GFRAA 36 (L) 01/23/2020 1454      ASSESSMENT:    1. Nonhealing skin ulcer, unspecified ulcer stage (HCC)   2. Pain and swelling of left lower leg   3. Essential hypertension   4. OSA on CPAP   5. Diabetes mellitus type 2 in obese ([place])   6. CKD (chronic kidney disease) stage 4, GFR 15-29 ml/min (HCC)   7. Severe obesity (BMI 35.0-35.9 with comorbidity) ([place])   8. Dyslipidemia (high LDL; low HDL)   9. PAD (peripheral artery disease) ([place])   10. S/P unilateral BKA (below knee amputation) ([place])      PLAN:  In order of problems listed above:  1. Venous stasis ulcers: Recently complicated by cellulitis.  Leg edema is probably worsened by the presence of right heart failure, but I suspect that the peripheral venous insufficiency is the major cause.  Encouraged him to keep the legs elevated as much as possible.  Wear compression stockings on the left leg.  Referred him to the wound center.   2. HTN: Adequate control, within target less than 130/80 3. OSA: He reports compliance with CPAP.  He denies symptoms of daytime hypersomnolence. 4. DM: Hemoglobin A1c in target range.  He has retinopathy, nephropathy and PAD. 5. CKD: Followed by Dr. Moshe Cipro.  Stable renal  function with creatinine around 1.9 and GFR just under 30. 6. Obesity: Probably is morbidly obese, once one accounts for the missing weight from his amputation. 7. HLP: Surprising increase in LDL cholesterol from last year when it was only 35.  Recommended continued compliance with statin on a daily basis.Marland Kitchen 8. PAD s/p R BKA, remains very sedentary although he does have a prosthesis, he does not have claudication in the opposite limb.  He does not have obstruction in the major vessels in the pelvis or thigh, but appears to have occlusive disease in the tibial arteries.   Medication Adjustments/Labs and Tests Ordered: Current medicines are reviewed at length with the patient today.  Concerns regarding medicines are outlined above.  Medication changes, Labs and Tests ordered today are listed in the Patient Instructions below. Patient Instructions  Medication Instructions:  No changes *If you need a refill on your cardiac medications before your next appointment, please call your pharmacy*   Lab Work: None ordered If you have labs (blood work) drawn today and your tests are completely normal, you will receive your results only by: Marland Kitchen MyChart Message (if you have MyChart) OR . A paper copy in the mail If you have any lab test that is abnormal or we need  to change your treatment, we will call you to review the results.   Testing/Procedures: None ordered   Follow-Up: At [facility], you and your health needs are our priority.  As part of our continuing mission to provide you with exceptional heart care, we have created designated Provider Care Teams.  These Care Teams include your primary Cardiologist (physician) and Advanced Practice Providers (APPs -  Physician Assistants and Nurse Practitioners) who all work together to provide you with the care you need, when you need it.  We recommend signing up for the patient portal called "MyChart".  Sign up information is provided on this After Visit  Summary.  MyChart is used to connect with patients for Virtual Visits (Telemedicine).  Patients are able to view lab/test results, encounter notes, upcoming appointments, etc.  Non-urgent messages can be sent to your provider as well.   To learn more about what you can do with MyChart, go to [email or web address].    Your next appointment:   12 month(s)  The format for your next appointment:   In Person  Provider:   You may see [name], MD or one of the following Advanced Practice Providers on your designated Care Team:    Almyra Deforest, PA-C  Fabian Sharp, [place] or   Roby Lofts, [place]    Other Instructions A referral has been placed to the [facility]. They will be in touch to make the appointment.      Signed, [name], MD  02/09/2020 4:46 PM    [facility] [address], [place], [place]  [address] Phone: [phone]; Fax: [phone]

## 2020-02-07 NOTE — Telephone Encounter (Signed)
Patients nurse called Anissa [phone]) asking for verbal orders for how to take care of the wound. I read the instructions from the AVS but apparently there were detailed orders for the actual ulcer. Can you please call her to give verbal orders? Thanks.

## 2020-02-08 NOTE — Progress Notes (Signed)
  Subjective:  Patient ID: Micheal Walls, male    DOB: May 23, 1936,  MRN: [id number]  Chief Complaint  Patient presents with  . Wound Check    Pt states no new concerns, denies fever/nausea/vomiting "I do usually have chills". Pt states clear drainage,    84 y.o. male presents with the above complaint. History confirmed with patient.  His wound care nurse was concerned that he had an infection and the leg was worsening and sent him in for evaluation.  Objective:  Physical Exam: warm, good capillary refill, normal DP and PT pulses and ulceration at left hallux as noted below.  Blistering on leg with weeping, no signs of infection Left Foot: hallux ulceration has improved Right Foot: Previous BKA  US DVT: no clot noted  Assessment:   1. Pain and swelling of left lower leg   2. Diabetic peripheral neuropathy associated with type 2 diabetes mellitus ([place])   3. Diabetic ulcer of left great toe ([place])   4. Lymphedema      Plan:  Patient was evaluated and treated and all questions answered.   Today his hallux ulcer has healed. His primary issue now is edema and weeping blisters on the left leg. This is beyond what we can care for here at his office and luckily he has a wound care appointment upcoming on 9/17, I encouraged him to make this appointment and they can care for this further as well as the hallux ulceration should it recur. Continue to monitor for S/S of DVT.  [name], DPM 02/08/2020      No follow-ups on file.

## 2020-02-09 ENCOUNTER — Encounter: Payer: Self-pay | Admitting: Cardiovascular Disease

## 2020-02-10 DIAGNOSIS — N183 Chronic kidney disease, stage 3 unspecified: Secondary | ICD-10-CM | POA: Diagnosis not present

## 2020-02-10 DIAGNOSIS — I130 Hypertensive heart and chronic kidney disease with heart failure and stage 1 through stage 4 chronic kidney disease, or unspecified chronic kidney disease: Secondary | ICD-10-CM | POA: Diagnosis not present

## 2020-02-10 LAB — COMPREHENSIVE METABOLIC PANEL
Albumin: 3.9 (ref 3.5–5.0)
Calcium: 10.4 (ref 8.7–10.7)
GFR calc Af Amer: 42
GFR calc non Af Amer: 36
Globulin: 3.1

## 2020-02-10 LAB — HEPATIC FUNCTION PANEL
ALT: 17 (ref 10–40)
AST: 21 (ref 14–40)
Alkaline Phosphatase: 84 (ref 25–125)
Bilirubin, Total: 0.3

## 2020-02-10 LAB — BASIC METABOLIC PANEL
BUN: 49 — AB (ref 4–21)
Chloride: 104 (ref 99–108)
Creatinine: 1.7 — AB (ref 0.6–1.3)
Glucose: 167
Potassium: 4.7 (ref 3.4–5.3)
Sodium: 140 (ref 137–147)

## 2020-02-11 ENCOUNTER — Ambulatory Visit: Payer: Medicare Other

## 2020-02-11 DIAGNOSIS — I10 Essential (primary) hypertension: Secondary | ICD-10-CM

## 2020-02-11 DIAGNOSIS — E1159 Type 2 diabetes mellitus with other circulatory complications: Secondary | ICD-10-CM

## 2020-02-11 DIAGNOSIS — N184 Chronic kidney disease, stage 4 (severe): Secondary | ICD-10-CM

## 2020-02-11 DIAGNOSIS — Z794 Long term (current) use of insulin: Secondary | ICD-10-CM

## 2020-02-11 NOTE — Chronic Care Management (AMB) (Signed)
Care Management Note   Micheal Walls is a 84 y.o. year old male who is a primary care patient of [name], [place] . The CM team was consulted for assistance with HTN, DM II, CKD.   Review of patient status, including review of consultants reports, rand collaboration with appropriate care team members and the patient's provider was performed as part of comprehensive patient evaluation and provision of care management services. Telephone outreach to patient today to introduce CM services.   SDOH (Social Determinants of Health) assessments performed: No    Outpatient Encounter Medications as of 02/11/2020  Medication Sig  . allopurinol (ZYLOPRIM) 100 MG tablet Take 1 tablet (100 mg total) by mouth daily. (Patient taking differently: Take 100 mg by mouth 2 (two) times daily. )  . antiseptic oral rinse (BIOTENE) LIQD 15 mLs by Mouth Rinse route as needed for dry mouth.  Marland Kitchen aspirin 81 MG tablet Take 162 mg by mouth daily.  . benzonatate (TESSALON PERLES) 100 MG capsule Take 1 capsule (100 mg total) by mouth every 6 (six) hours as needed.  [name] Use a cotton tip applicator. Paint between toes of left foot as needed once daily. (Patient taking differently: See admin instructions. Use a cotton tip applicator. Paint between toes of left foot once daily.)  . colchicine 0.6 MG tablet Take 0.6 mg by mouth daily.   . diclofenac Sodium (VOLTAREN) 1 % GEL Apply 2 g topically 4 (four) times daily.  Marland Kitchen diltiazem (CARDIZEM CD) 300 MG 24 hr capsule TAKE ONE CAPSULE EACH DAY (Patient taking differently: Take 300 mg by mouth daily. )  . dorzolamide (TRUSOPT) 2 % ophthalmic solution Place 1 drop into both eyes 2 (two) times daily.  . ferrous sulfate 325 (65 FE) MG tablet Take 325 mg by mouth daily with breakfast.  . fish oil-omega-3 fatty acids 1000 MG capsule Take 1 g by mouth 2 (two) times daily.   . furosemide (LASIX) 20 MG tablet Take 1 tab midday for 3 days only.  .  furosemide (LASIX) 40 MG tablet Take 2 tabs in morning and 1 tab evening  . gabapentin (NEURONTIN) 100 MG capsule Take 1 capsule  by mouth 3  times daily. (Patient taking differently: Take 100 mg by mouth 3 (three) times daily. )  . glucose blood (FREESTYLE PRECISION NEO TEST) test strip Use as instructed  . Glycerin-Polysorbate 80 (REFRESH DRY EYE THERAPY OP) Place 1 drop into both eyes in the morning and at bedtime.  . hydrocortisone (ANUSOL-HC) 25 MG suppository Place 25 mg rectally 2 (two) times daily as needed for hemorrhoids or anal itching.  . Insulin Degludec (TRESIBA FLEXTOUCH [place]) Inject 10 mg into the skin at bedtime.  Marland Kitchen ketotifen (ALAWAY) 0.025 % ophthalmic solution Place 1 drop into both eyes 2 (two) times daily.  Marland Kitchen latanoprost (XALATAN) 0.005 % ophthalmic solution Place 1 drop into both eyes at bedtime.   Marland Kitchen losartan (COZAAR) 100 MG tablet Take 100 mg by mouth at bedtime.  . Multiple Vitamin (MULTIVITAMIN) tablet Take 1 tablet by mouth daily.  . niacin (NIASPAN) 500 MG CR tablet TAKE 2 TABLETS AT BEDTIME  . pantoprazole (PROTONIX) 40 MG tablet Take 1 tablet  by mouth daily. (Patient taking differently: Take 40 mg by mouth daily. )  . pramoxine-hydrocortisone (PRAMOSONE) cream Apply topically 3 (three) times daily. Apply topically to hemorrhoids four times daily as needed after bowel movements, for external use only  . PRESCRIPTION MEDICATION See admin  instructions. CPAP- At bedtime  . rosuvastatin (CRESTOR) 10 MG tablet Take 1 tablet (10 mg total) by mouth daily. (Patient taking differently: Take 10 mg by mouth once a week. )  . Semaglutide, [name], (OZEMPIC, [name],) 2 MG/1.5ML SOPN Inject 1 mg into the skin once a week.   No facility-administered encounter medications on file as of 02/11/2020.    I reached out to [name] by phone today.   Micheal Walls was given information about Chronic Care Management services today including:  1. CCM service includes personalized  support from designated clinical staff supervised by his physician, including individualized plan of care and coordination with other care providers 2. 24/7 contact phone numbers for assistance for urgent and routine care needs. 3. Service will only be billed when office clinical staff spend 20 minutes or more in a month to coordinate care. 4. Only one practitioner may furnish and bill the service in a calendar month. 5. The patient may stop CCM services at any time (effective at the end of the month) by phone call to the office staff. 6. The patient will be responsible for cost sharing (co-pay) of up to 20% of the service fee (after annual deductible is met).   Patient did not agree to enrollment in care management services and does not wish to consider at this time.   Follow Up Plan: No SW follow up planned at this time. The patient has been contacted twice over the last 4 weeks regarding enrollment and has declined.   [name], [name], [name], Certified Dementia Practitioner [place] [facility] [phone]

## 2020-02-19 ENCOUNTER — Encounter (HOSPITAL_BASED_OUTPATIENT_CLINIC_OR_DEPARTMENT_OTHER): Payer: Medicare Other | Attending: Internal Medicine | Admitting: Physician Assistant

## 2020-02-19 DIAGNOSIS — N184 Chronic kidney disease, stage 4 (severe): Secondary | ICD-10-CM | POA: Insufficient documentation

## 2020-02-19 DIAGNOSIS — E1169 Type 2 diabetes mellitus with other specified complication: Secondary | ICD-10-CM | POA: Insufficient documentation

## 2020-02-19 DIAGNOSIS — Z89511 Acquired absence of right leg below knee: Secondary | ICD-10-CM | POA: Insufficient documentation

## 2020-02-19 DIAGNOSIS — I7389 Other specified peripheral vascular diseases: Secondary | ICD-10-CM | POA: Diagnosis not present

## 2020-02-19 DIAGNOSIS — I130 Hypertensive heart and chronic kidney disease with heart failure and stage 1 through stage 4 chronic kidney disease, or unspecified chronic kidney disease: Secondary | ICD-10-CM | POA: Diagnosis not present

## 2020-02-19 DIAGNOSIS — I890 Lymphedema, not elsewhere classified: Secondary | ICD-10-CM | POA: Diagnosis not present

## 2020-02-19 DIAGNOSIS — E1122 Type 2 diabetes mellitus with diabetic chronic kidney disease: Secondary | ICD-10-CM | POA: Insufficient documentation

## 2020-02-19 DIAGNOSIS — E1151 Type 2 diabetes mellitus with diabetic peripheral angiopathy without gangrene: Secondary | ICD-10-CM | POA: Diagnosis not present

## 2020-02-19 DIAGNOSIS — I5032 Chronic diastolic (congestive) heart failure: Secondary | ICD-10-CM | POA: Insufficient documentation

## 2020-02-19 DIAGNOSIS — I739 Peripheral vascular disease, unspecified: Secondary | ICD-10-CM | POA: Diagnosis not present

## 2020-02-19 LAB — IRON AND TIBC
Iron Saturation: 12 % — ABNORMAL LOW (ref 15–55)
Iron: 24 ug/dL — ABNORMAL LOW (ref 38–169)
Total Iron Binding Capacity: 202 ug/dL — ABNORMAL LOW (ref 250–450)
UIBC: 178 ug/dL (ref 111–343)

## 2020-02-19 LAB — FERRITIN: Ferritin: 434 ng/mL — ABNORMAL HIGH (ref 30–400)

## 2020-02-19 LAB — SPECIMEN STATUS REPORT

## 2020-02-19 NOTE — Progress Notes (Signed)
Micheal Walls, Micheal Walls ([id number]) Visit Report for 02/19/2020 Allergy List Details Patient Name: Date of Service: Micheal Walls, Micheal Walls 02/19/2020 1:15 PM Medical Record Number: [id number] Patient Account Number: [id number] Date of Birth/Sex: Treating RN: 09-Jul-1935 (84 y.o. Hessie Diener Primary Care Keilen Kahl: [name] Other Clinician: Referring Gretna Bergin: Treating Nichoals Heyde/Extender: Moody Bruins, Doreene Burke Weeks in Treatment: 0 Allergies Active Allergies No Known Drug Allergies Allergy Notes Electronic Signature(s) Signed: 02/19/2020 5:39:04 PM By: Deon Pilling Entered By: Deon Pilling on 02/19/2020 13:28:19 -------------------------------------------------------------------------------- Arrival Information Details Patient Name: Date of Service: Micheal Quails D. 02/19/2020 1:15 PM Medical Record Number: [id number] Patient Account Number: [id number] Date of Birth/Sex: Treating RN: [birth year] (84 y.o. Hessie Diener Primary Care Mileydi Milsap: [name] Other Clinician: Referring Kemoni Quesenberry: Treating Aiya Keach/Extender: Edythe Lynn in Treatment: 0 Visit Information Patient Arrived: Wheel Chair Arrival Time: 13:27 Accompanied By: daughter Transfer Assistance: None Patient Identification Verified: Yes Secondary Verification Process Completed: Yes Patient Requires Transmission-Based Precautions: No Patient Has Alerts: No Electronic Signature(s) Signed: 02/19/2020 5:39:04 PM By: Deon Pilling Entered By: Deon Pilling on 02/19/2020 13:28:09 -------------------------------------------------------------------------------- Clinic Level of Care Assessment Details Patient Name: Date of Service: Micheal Walls, Micheal Walls 02/19/2020 1:15 PM Medical Record Number: [id number] Patient Account Number: [id number] Date of Birth/Sex: Treating RN: October 01, 1935 (84 y.o. [name] Primary Care Salima Rumer: [name] Other Clinician: Referring Mattia Liford: Treating  Yassir Enis/Extender: Brett Albino Weeks in Treatment: 0 Clinic Level of Care Assessment Items TOOL 1 Quantity Score []  - 0 Use when EandM and Procedure is performed on INITIAL visit ASSESSMENTS - Nursing Assessment / Reassessment X- 1 20 General Physical Exam (combine w/ comprehensive assessment (listed just below) when performed on new pt. evals) X- 1 25 Comprehensive Assessment (HX, ROS, Risk Assessments, Wounds Hx, etc.) ASSESSMENTS - Wound and Skin Assessment / Reassessment []  - 0 Dermatologic / Skin Assessment (not related to wound area) ASSESSMENTS - Ostomy and/or Continence Assessment and Care []  - 0 Incontinence Assessment and Management []  - 0 Ostomy Care Assessment and Management (repouching, etc.) PROCESS - Coordination of Care X - Simple Patient / Family Education for ongoing care 1 15 []  - 0 Complex (extensive) Patient / Family Education for ongoing care X- 1 10 Staff obtains Programmer, systems, Records, T Results / Process Orders est []  - 0 Staff telephones HHA, Nursing Homes / Clarify orders / etc []  - 0 Routine Transfer to another Facility (non-emergent condition) []  - 0 Routine Hospital Admission (non-emergent condition) X- 1 15 New Admissions / Biomedical engineer / Ordering NPWT Apligraf, etc. , []  - 0 Emergency Hospital Admission (emergent condition) PROCESS - Special Needs []  - 0 Pediatric / Minor Patient Management []  - 0 Isolation Patient Management []  - 0 Hearing / Language / Visual special needs []  - 0 Assessment of Community assistance (transportation, D/C planning, etc.) []  - 0 Additional assistance / Altered mentation []  - 0 Support Surface(s) Assessment (bed, cushion, seat, etc.) INTERVENTIONS - Miscellaneous []  - 0 External ear exam []  - 0 Patient Transfer (multiple staff / Civil Service fast streamer / Similar devices) []  - 0 Simple Staple / Suture removal (25 or less) []  - 0 Complex Staple / Suture removal (26 or more) []  -  0 Hypo/Hyperglycemic Management (do not check if billed separately) []  - 0 Ankle / Brachial Index (ABI) - do not check if billed separately Has the patient been seen at the hospital within the last three years: Yes Total Score: 85 Level Of Care: New/Established -  Level 3 Electronic Signature(s) Signed: 02/19/2020 5:28:47 PM By: Baruch Gouty RN, BSN Entered By: Baruch Gouty on 02/19/2020 14:34:47 -------------------------------------------------------------------------------- Compression Therapy Details Patient Name: Date of Service: Micheal Quails D. 02/19/2020 1:15 PM Medical Record Number: [id number] Patient Account Number: [id number] Date of Birth/Sex: Treating RN: 24-Jan-1936 (84 y.o. [name] Primary Care Annarae Macnair: [name] Other Clinician: Referring Marlin Brys: Treating Sameerah Nachtigal/Extender: Brett Albino Weeks in Treatment: 0 Compression Therapy Performed for Wound Assessment: NonWound Condition Lymphedema - Left Leg Performed By: Clinician Carlene Coria, RN Compression Type: Three Layer Post Procedure Diagnosis Same as Pre-procedure Electronic Signature(s) Signed: 02/19/2020 5:28:47 PM By: Baruch Gouty RN, BSN Entered By: Baruch Gouty on 02/19/2020 14:37:11 -------------------------------------------------------------------------------- Encounter Discharge Information Details Patient Name: Date of Service: Micheal Quails D. 02/19/2020 1:15 PM Medical Record Number: [id number] Patient Account Number: [id number] Date of Birth/Sex: Treating RN: 05-Mar-1936 (84 y.o. Hessie Diener Primary Care Brewer Hitchman: [name] Other Clinician: Referring Aranza Geddes: Treating Torez Beauregard/Extender: Edythe Lynn in Treatment: 0 Encounter Discharge Information Items Discharge Condition: Stable Ambulatory Status: Wheelchair Discharge Destination: Home Transportation: Private Auto Accompanied By: daughter Schedule Follow-up  Appointment: Yes Clinical Summary of Care: Electronic Signature(s) Signed: 02/19/2020 5:39:04 PM By: Deon Pilling Entered By: Deon Pilling on 02/19/2020 14:53:24 -------------------------------------------------------------------------------- Lower Extremity Assessment Details Patient Name: Date of Service: Micheal Walls, Micheal Walls 02/19/2020 1:15 PM Medical Record Number: [id number] Patient Account Number: [id number] Date of Birth/Sex: Treating RN: 1936-03-17 (84 y.o. Hessie Diener Primary Care Yuuki Skeens: [name] Other Clinician: Referring Savien Mamula: Treating Lorijean Husser/Extender: Brett Albino Weeks in Treatment: 0 Edema Assessment Assessed: [Left: Yes] [Right: No] Edema: [Left: Ye] [Right: s] Calf Left: Right: Point of Measurement: 29 cm From Medial Instep 44 cm cm Ankle Left: Right: Point of Measurement: 10 cm From Medial Instep 34 cm cm Vascular Assessment Pulses: Dorsalis Pedis Palpable: [Left:Yes] Electronic Signature(s) Signed: 02/19/2020 5:39:04 PM By: Deon Pilling Entered By: Deon Pilling on 02/19/2020 13:44:51 -------------------------------------------------------------------------------- Multi-Disciplinary Care Plan Details Patient Name: Date of Service: Micheal Quails D. 02/19/2020 1:15 PM Medical Record Number: [id number] Patient Account Number: [id number] Date of Birth/Sex: Treating RN: [birth year] (84 y.o. [name] Primary Care Couper Juncaj: [name] Other Clinician: Referring Breyer Tejera: Treating Hermine Feria/Extender: Brett Albino Weeks in Treatment: 0 Active Inactive Venous Leg Ulcer Nursing Diagnoses: Knowledge deficit related to disease process and management Goals: Patient will maintain optimal edema control Date Initiated: 02/19/2020 Target Resolution Date: 03/18/2020 Goal Status: Active Interventions: Assess peripheral edema status every visit. Compression as ordered Provide education on venous  insufficiency Treatment Activities: Therapeutic compression applied : 02/19/2020 Notes: Electronic Signature(s) Signed: 02/19/2020 5:28:47 PM By: Baruch Gouty RN, BSN Entered By: Baruch Gouty on 02/19/2020 14:33:32 -------------------------------------------------------------------------------- Pain Assessment Details Patient Name: Date of Service: Micheal Quails D. 02/19/2020 1:15 PM Medical Record Number: [id number] Patient Account Number: [id number] Date of Birth/Sex: Treating RN: [birth year] (84 y.o. Hessie Diener Primary Care Angelise Petrich: [name] Other Clinician: Referring Cayson Kalb: Treating Kamaiyah Uselton/Extender: Brett Albino Weeks in Treatment: 0 Active Problems Location of Pain Severity and Description of Pain Patient Has Paino No Site Locations Rate the pain. Current Pain Level: 0 Pain Management and Medication Current Pain Management: Medication: No Cold Application: No Rest: No Massage: No Activity: No T.E.N.S.: No Heat Application: No Leg drop or elevation: No Is the Current Pain Management Adequate: Adequate How does your wound impact your activities of daily livingo Sleep: No Bathing: No Appetite: No Relationship  With Others: No Bladder Continence: No Emotions: No Bowel Continence: No Work: No Toileting: No Drive: No Dressing: No Hobbies: No Electronic Signature(s) Signed: 02/19/2020 5:39:04 PM By: Deon Pilling Entered By: Deon Pilling on 02/19/2020 13:33:14 -------------------------------------------------------------------------------- Patient/Caregiver Education Details Patient Name: Date of Service: Eugenie Birks 9/8/2021andnbsp1:15 PM Medical Record Number: [id number] Patient Account Number: [id number] Date of Birth/Gender: Treating RN: [birth year] (84 y.o. [name] Primary Care Physician: [name] Other Clinician: Referring Physician: Treating Physician/Extender: Edythe Lynn in Treatment: 0 Education Assessment Education Provided To: Patient Education Topics Provided Venous: Handouts: Controlling Swelling with Compression Stockings Methods: Explain/Verbal, Printed Responses: Reinforcements needed, State content correctly [place]: o Handouts: Welcome T [facility] [place] o Methods: Explain/Verbal, Printed Responses: Reinforcements needed, State content correctly Wound/Skin Impairment: Methods: Explain/Verbal Responses: Reinforcements needed, State content correctly Electronic Signature(s) Signed: 02/19/2020 5:28:47 PM By: Baruch Gouty RN, BSN Entered By: Baruch Gouty on 02/19/2020 14:29:24 -------------------------------------------------------------------------------- [facility] Details Patient Name: Date of Service: Micheal Quails D. 02/19/2020 1:15 PM Medical Record Number: [id number] Patient Account Number: [id number] Date of Birth/Sex: Treating RN: 07-31-1935 (84 y.o. Hessie Diener Primary Care Marquelle Balow: [name] Other Clinician: Referring Miking Usrey: Treating Yug Loria/Extender: Brett Albino Weeks in Treatment: 0 Vital Signs Time Taken: 13:30 Temperature (F): 97.7 Height (in): 73 Pulse (bpm): 93 Source: Stated Respiratory Rate (breaths/min): 20 Weight (lbs): 276 Blood Pressure (mmHg): 150/73 Source: Stated Reference Range: 80 - 120 mg / dl Body Mass Index (BMI): 36.4 Electronic Signature(s) Signed: 02/19/2020 5:39:04 PM By: Deon Pilling Entered By: Deon Pilling on 02/19/2020 13:33:56

## 2020-02-19 NOTE — Progress Notes (Signed)
JACQUEL, MCCAMISH ([id number]) Visit Report for 02/19/2020 Chief Complaint Document Details Patient Name: Date of Service: Micheal Walls, Micheal Walls 02/19/2020 1:15 PM Medical Record Number: [id number] Patient Account Number: [id number] Date of Birth/Sex: Treating RN: [birth year] (84 y.o. Micheal Walls Primary Care Provider: [name] Other Clinician: Referring Provider: Treating Provider/Extender: Brett Albino Weeks in Treatment: 0 Information Obtained from: Patient Chief Complaint Bilateral LE Lymphedema Electronic Signature(s) Signed: 02/19/2020 2:17:56 PM By: Worthy Keeler PA-C Entered By: Worthy Keeler on 02/19/2020 14:17:56 -------------------------------------------------------------------------------- HPI Details Patient Name: Date of Service: Micheal Quails D. 02/19/2020 1:15 PM Medical Record Number: [id number] Patient Account Number: [id number] Date of Birth/Sex: Treating RN: February 23, 1936 (84 y.o. Micheal Walls Primary Care Provider: [name] Other Clinician: Referring Provider: Treating Provider/Extender: Brett Albino Weeks in Treatment: 0 History of Present Illness HPI Description: 02/19/2020 upon evaluation today patient presents today for initial evaluation in clinic concerning issues that has been having with his bilateral lower extremities. He apparently also had a wound on his great toe. With that being said he does have a history of diabetes mellitus type 2, lymphedema, peripheral vascular disease, congestive heart failure, hypertension, right below-knee amputation, and chronic kidney disease stage IV according to records reviewed with his creatinine clearance being below 30. With that being said the legs have been actually worse than what they are currently he does not seem to have any open wounds at this point significantly he does have a small leaking area on the left leg at this point. With that being said the majority  of what I see is a lot of skin changes secondary to the chronic lymphedema to be honest. There does not appear to be any signs of systemic infection or local infection based on what I see today. The patient is doing much better with regard to the wound on his toe as well according to the daughter. He has been seeing podiatry. They do have questions about whether or not he can perform physical therapy to have no limitations on physical therapy with regard to his wounds to be honest. With that being said he questions having some pain in the ankle region which again I am not really the specialist to comment on that therefore I recommended that he probably needs to discuss this with his podiatrist. Electronic Signature(s) Signed: 02/19/2020 5:33:59 PM By: Worthy Keeler PA-C Entered By: Worthy Keeler on 02/19/2020 17:33:58 -------------------------------------------------------------------------------- Physical Exam Details Patient Name: Date of Service: Micheal Quails D. 02/19/2020 1:15 PM Medical Record Number: [id number] Patient Account Number: [id number] Date of Birth/Sex: Treating RN: December 16, 1935 (84 y.o. Micheal Walls Primary Care Provider: [name] Other Clinician: Referring Provider: Treating Provider/Extender: Brett Albino Weeks in Treatment: 0 Constitutional patient is hypertensive.. pulse regular and within target range for patient.Marland Kitchen respirations regular, non-labored and within target range for patient.Marland Kitchen temperature within target range for patient.. Well-nourished and well-hydrated in no acute distress. Eyes conjunctiva clear no eyelid edema noted. pupils equal round and reactive to light and accommodation. Ears, Nose, Mouth, and Throat no gross abnormality of ear auricles or external auditory canals. normal hearing noted during conversation. mucus membranes moist. Respiratory normal breathing without difficulty. Cardiovascular 1+ dorsalis  pedis/posterior tibialis pulses. Patient has bilateral stage III lymphedema. Musculoskeletal normal gait and posture. no significant deformity or arthritic changes, no loss or range of motion, no clubbing. Psychiatric this patient is able to make decisions and demonstrates good insight  into disease process. Alert and Oriented x 3. pleasant and cooperative. Notes Upon inspection patient's wound bed actually showed signs of fairly good granulation at this time and epithelization which I see minimal opening actually there was just a small pinpoint area on the left leg which is weeping just a little bit from the spot. Nonetheless I think we can probably get this under control fairly quickly with a compression wrap for short amount of time may be a week or so. Also think that he really needs to have some compression socks which are newer as his a year and a half old he tells me. This is something he really should be wearing every day to keep his edema under good control. Electronic Signature(s) Signed: 02/19/2020 5:35:35 PM By: Worthy Keeler PA-C Entered By: Worthy Keeler on 02/19/2020 17:35:34 -------------------------------------------------------------------------------- Physician Orders Details Patient Name: Date of Service: Micheal Quails D. 02/19/2020 1:15 PM Medical Record Number: [id number] Patient Account Number: [id number] Date of Birth/Sex: Treating RN: April 02, 1936 (84 y.o. Micheal Walls Primary Care Provider: [name] Other Clinician: Referring Provider: Treating Provider/Extender: Brett Albino Weeks in Treatment: 0 Verbal / Phone Orders: No Diagnosis Coding ICD-10 Coding Code Description E11.69 Type 2 diabetes mellitus with other specified complication W38.8 Lymphedema, not elsewhere classified I73.89 Other specified peripheral vascular diseases E28.00 Chronic diastolic (congestive) heart failure I10 Essential (primary) hypertension Z89.511  Acquired absence of right leg below knee N18.4 Chronic kidney disease, stage 4 (severe) Follow-up Appointments Return Appointment in 1 week. Dressing Change Frequency Do not change entire dressing for one week. Skin Barriers/Peri-Wound Care Moisturizing lotion - to left leg under wrap Wound Cleansing May shower with protection. Secondary Dressing Dry Gauze - to weeping area left lower leg Edema Control 3 Layer Compression System - Left Lower Extremity Avoid standing for long periods of time Elevate legs to the level of the heart or above for 30 minutes daily and/or when sitting, a frequency of: - throughout the day Exercise regularly - check with podiatry if able to resume Physical therapy Support Garment 20-30 mm/Hg pressure to: [facility] for wound care. [name] Electronic Signature(s) Signed: 02/19/2020 5:28:47 PM By: Baruch Gouty RN, BSN Signed: 02/19/2020 5:46:37 PM By: Worthy Keeler PA-C Entered By: Baruch Gouty on 02/19/2020 14:42:01 -------------------------------------------------------------------------------- Problem List Details Patient Name: Date of Service: Micheal Quails D. 02/19/2020 1:15 PM Medical Record Number: [id number] Patient Account Number: [id number] Date of Birth/Sex: Treating RN: 1936-06-06 (84 y.o. Micheal Walls Primary Care Provider: [name] Other Clinician: Referring Provider: Treating Provider/Extender: Brett Albino Weeks in Treatment: 0 Active Problems ICD-10 Encounter Code Description Active Date MDM Diagnosis E11.69 Type 2 diabetes mellitus with other specified complication 10/16/9792 No Yes I89.0 Lymphedema, not elsewhere classified 02/19/2020 No Yes I73.89 Other specified peripheral vascular diseases 02/19/2020 No Yes I01.65 Chronic diastolic (congestive) heart failure 02/19/2020 No Yes I10 Essential (primary) hypertension 02/19/2020 No Yes Z89.511 Acquired absence of  right leg below knee 02/19/2020 No Yes N18.4 Chronic kidney disease, stage 4 (severe) 02/19/2020 No Yes Inactive Problems Resolved Problems Electronic Signature(s) Signed: 02/19/2020 2:17:31 PM By: Worthy Keeler PA-C Entered By: Worthy Keeler on 02/19/2020 14:17:30 -------------------------------------------------------------------------------- Progress Note Details Patient Name: Date of Service: Micheal Quails D. 02/19/2020 1:15 PM Medical Record Number: [id number] Patient Account Number: [id number] Date of Birth/Sex: Treating RN: Jan 26, 1936 (84 y.o. Micheal Walls Primary Care Provider: [name] Other Clinician: Referring Provider: Treating  Provider/Extender: Brett Albino Weeks in Treatment: 0 Subjective Chief Complaint Information obtained from Patient Bilateral LE Lymphedema History of Present Illness (HPI) 02/19/2020 upon evaluation today patient presents today for initial evaluation in clinic concerning issues that has been having with his bilateral lower extremities. He apparently also had a wound on his great toe. With that being said he does have a history of diabetes mellitus type 2, lymphedema, peripheral vascular disease, congestive heart failure, hypertension, right below-knee amputation, and chronic kidney disease stage IV according to records reviewed with his creatinine clearance being below 30. With that being said the legs have been actually worse than what they are currently he does not seem to have any open wounds at this point significantly he does have a small leaking area on the left leg at this point. With that being said the majority of what I see is a lot of skin changes secondary to the chronic lymphedema to be honest. There does not appear to be any signs of systemic infection or local infection based on what I see today. The patient is doing much better with regard to the wound on his toe as well according to the daughter. He has been  seeing podiatry. They do have questions about whether or not he can perform physical therapy to have no limitations on physical therapy with regard to his wounds to be honest. With that being said he questions having some pain in the ankle region which again I am not really the specialist to comment on that therefore I recommended that he probably needs to discuss this with his podiatrist. Patient History Information obtained from Patient. Allergies No Known Drug Allergies Family History Cancer - Siblings, Diabetes - Mother,Siblings, Hypertension - Siblings, No family history of Heart Disease, Hereditary Spherocytosis, Kidney Disease, Lung Disease, Seizures, Stroke, Thyroid Problems, Tuberculosis. Social History Never smoker, Marital Status - Married, Alcohol Use - Rarely, Drug Use - No History, Caffeine Use - Never. Medical History Eyes Patient has history of Cataracts - removed Denies history of Glaucoma, Optic Neuritis Ear/Nose/Mouth/Throat Denies history of Chronic sinus problems/congestion, Middle ear problems Hematologic/Lymphatic Patient has history of Anemia, Lymphedema Denies history of Hemophilia, Human Immunodeficiency Virus, Sickle Cell Disease Respiratory Patient has history of Sleep Apnea - CPAP Denies history of Aspiration, Asthma, Chronic Obstructive Pulmonary Disease (COPD), Pneumothorax, Tuberculosis Cardiovascular Patient has history of Congestive Heart Failure, Hypertension, Peripheral Venous Disease Denies history of Angina, Arrhythmia, Hypotension, Myocardial Infarction, Peripheral Arterial Disease, Phlebitis, Vasculitis Gastrointestinal Denies history of Cirrhosis , Colitis, Crohnoos, Hepatitis A, Hepatitis B, Hepatitis C Endocrine Patient has history of Type II Diabetes Denies history of Type I Diabetes Genitourinary Patient has history of 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 Blood sugar is tested. Hospitalization/Surgery History - Stroke 2002. - right BKA 2000. - Anemia 2012. - Fall 10/2019. Medical A Surgical History Notes nd Constitutional Symptoms (General Health) Stroke 2002 Ear/Nose/Mouth/Throat coughing attacks at times related to swallowing issues at times. Review of Systems (ROS) Constitutional Symptoms (General Health) Denies complaints or symptoms of Fatigue, Fever, Chills, Marked Weight Change. Eyes Complains or has symptoms of Glasses /  Contacts - glasses. Denies complaints or symptoms of Dry Eyes, Vision Changes. Ear/Nose/Mouth/Throat Denies complaints or symptoms of Chronic sinus problems or rhinitis. Respiratory Denies complaints or symptoms of Chronic or frequent coughs, Shortness of Breath. Cardiovascular Denies complaints or symptoms of Chest pain. Gastrointestinal Denies complaints or symptoms of Frequent diarrhea, Nausea, Vomiting. Genitourinary Denies complaints or symptoms of Frequent urination. Integumentary (Skin) Denies complaints or symptoms of Wounds. Musculoskeletal Denies complaints or symptoms of Muscle Pain, Muscle Weakness. Neurologic Denies complaints or symptoms of Numbness/parasthesias. Psychiatric Denies complaints or symptoms of Claustrophobia, Suicidal. Objective Constitutional patient is hypertensive.. pulse regular and within target range for patient.Marland Kitchen respirations regular, non-labored and within target range for patient.Marland Kitchen temperature within  target range for patient.. Well-nourished and well-hydrated in no acute distress. Vitals Time Taken: 1:30 PM, Height: 73 in, Source: Stated, Weight: 276 lbs, Source: Stated, BMI: 36.4, Temperature: 97.7 F, Pulse: 93 bpm, Respiratory Rate: 20 breaths/min, Blood Pressure: 150/73 mmHg. Eyes conjunctiva clear no eyelid edema noted. pupils equal round and reactive to light and accommodation. Ears, Nose, Mouth, and Throat no gross abnormality of ear auricles or external auditory canals. normal hearing noted during conversation. mucus membranes moist. Respiratory normal breathing without difficulty. Cardiovascular 1+ dorsalis pedis/posterior tibialis pulses. Patient has bilateral stage III lymphedema. Musculoskeletal normal gait and posture. no significant deformity or arthritic changes, no loss or range of motion, no clubbing. Psychiatric this patient is able to make decisions and demonstrates good insight into disease process. Alert and Oriented x 3. pleasant and cooperative. General Notes: Upon inspection patient's wound bed actually showed signs of fairly good granulation at this time and epithelization which I see minimal opening actually there was just a small pinpoint area on the left leg which is weeping just a little bit from the spot. Nonetheless I think we can probably get this under control fairly quickly with a compression wrap for short amount of time may be a week or so. Also think that he really needs to have some compression socks which are newer as his a year and a half old he tells me. This is something he really should be wearing every day to keep his edema under good control. Assessment Active Problems ICD-10 Type 2 diabetes mellitus with other specified complication Lymphedema, not elsewhere classified Other specified peripheral vascular diseases Chronic diastolic (congestive) heart failure Essential (primary) hypertension Acquired absence of right leg below knee Chronic  kidney disease, stage 4 (severe) Procedures There was a Three Layer Compression Therapy Procedure by Carlene Coria, RN. Post procedure Diagnosis Wound #: Same as Pre-Procedure Plan Follow-up Appointments: Return Appointment in 1 week. Dressing Change Frequency: Do not change entire dressing for one week. Skin Barriers/Peri-Wound Care: Moisturizing lotion - to left leg under wrap Wound Cleansing: May shower with protection. Secondary Dressing: Dry Gauze - to weeping area left lower leg Edema Control: 3 Layer Compression System - Left Lower Extremity Avoid standing for long periods of time Elevate legs to the level of the heart or above for 30 minutes daily and/or when sitting, a frequency of: - throughout the day Exercise regularly - check with podiatry if able to resume Physical therapy Support Garment 20-30 mm/Hg pressure to: Home Health: [facility] for wound care. - [place] [facility]. I would recommend at this point that we go ahead and initiate a compression wrap for the patient. I would recommend a 3 layer compression wrap to left lower extremity try to keep the edema under control and help seal up the  last small leaking area at this point. 2. I am also can recommend at this time that the patient elevate his legs is much as possible to the level of the heart. Obviously the more that he can elevate the better. 3. I am also going to suggest that the patient contact podiatry to inquire about physical therapy with regard to the pain in his left ankle and proximal foot region. Again I explained I am not the specialist to be inquired about as far as whether he can resume physical therapy or not in that regard. I have no limitations in physical therapy for him as far as the wounds are concerned. We will see patient back for reevaluation in 1 week here in the clinic. If anything worsens or changes patient will contact our office for  additional recommendations. Electronic Signature(s) Signed: 02/19/2020 5:38:57 PM By: Worthy Keeler PA-C Entered By: Worthy Keeler on 02/19/2020 17:38:56 -------------------------------------------------------------------------------- HxROS Details Patient Name: Date of Service: Micheal Quails D. 02/19/2020 1:15 PM Medical Record Number: [id number] Patient Account Number: [id number] Date of Birth/Sex: Treating RN: Jul 31, 1935 (84 y.o. Hessie Diener Primary Care Provider: [name] Other Clinician: Referring Provider: Treating Provider/Extender: Brett Albino Weeks in Treatment: 0 Information Obtained From Patient Constitutional Symptoms (General Health) Complaints and Symptoms: Negative for: Fatigue; Fever; Chills; Marked Weight Change Medical History: Past Medical History Notes: Stroke 2002 Eyes Complaints and Symptoms: Positive for: Glasses / Contacts - glasses Negative for: Dry Eyes; Vision Changes Medical History: Positive for: Cataracts - removed Negative for: Glaucoma; Optic Neuritis Ear/Nose/Mouth/Throat Complaints and Symptoms: Negative for: Chronic sinus problems or rhinitis Medical History: Negative for: Chronic sinus problems/congestion; Middle ear problems Past Medical History Notes: coughing attacks at times related to swallowing issues at times. Respiratory Complaints and Symptoms: Negative for: Chronic or frequent coughs; Shortness of Breath Medical History: Positive for: Sleep Apnea - CPAP Negative for: Aspiration; Asthma; Chronic Obstructive Pulmonary Disease (COPD); Pneumothorax; Tuberculosis Cardiovascular Complaints and Symptoms: Negative for: Chest pain Medical History: Positive for: Congestive Heart Failure; Hypertension; Peripheral Venous Disease Negative for: Angina; Arrhythmia; Hypotension; Myocardial Infarction; Peripheral Arterial Disease; Phlebitis; Vasculitis Gastrointestinal Complaints and Symptoms: Negative  for: Frequent diarrhea; Nausea; Vomiting Medical History: Negative for: Cirrhosis ; Colitis; Crohns; Hepatitis A; Hepatitis B; Hepatitis C Genitourinary Complaints and Symptoms: Negative for: Frequent urination Medical History: Positive for: End Stage Renal Disease - stage IIII Integumentary (Skin) Complaints and Symptoms: Negative for: Wounds Medical History: Negative for: History of Burn Musculoskeletal Complaints and Symptoms: Negative for: Muscle Pain; Muscle Weakness Medical History: Positive for: Gout Negative for: Rheumatoid Arthritis; Osteoarthritis; Osteomyelitis Neurologic Complaints and Symptoms: Negative for: Numbness/parasthesias Medical History: Positive for: Neuropathy Negative for: Dementia; Quadriplegia; Paraplegia; Seizure Disorder Psychiatric Complaints and Symptoms: Negative for: Claustrophobia; Suicidal Medical History: Negative for: Anorexia/bulimia; Confinement Anxiety Hematologic/Lymphatic Medical History: Positive for: Anemia; Lymphedema Negative for: Hemophilia; Human Immunodeficiency Virus; Sickle Cell Disease Endocrine Medical History: Positive for: Type II Diabetes Negative for: Type I Diabetes Time with diabetes: 1980s Treated with: Insulin Blood sugar tested every day: Yes Tested : Immunological Medical History: Negative for: Lupus Erythematosus; Raynauds; Scleroderma Oncologic Medical History: Negative for: Received Chemotherapy; Received Radiation HBO Extended History Items Eyes: Cataracts Immunizations Pneumococcal Vaccine: Received Pneumococcal Vaccination: Yes Implantable Devices None Hospitalization / Surgery History Type of Hospitalization/Surgery Stroke 2002 right BKA 2000 Anemia 2012 Fall 10/2019 Family and Social History Cancer: Yes - Siblings; Diabetes: Yes - Mother,Siblings; Heart Disease: No; Hereditary Spherocytosis: No; Hypertension: Yes - Siblings; Kidney Disease:  No; Lung Disease: No; Seizures: No; Stroke:  No; Thyroid Problems: No; Tuberculosis: No; Never smoker; Marital Status - Married; Alcohol Use: Rarely; Drug Use: No History; Caffeine Use: Never; Financial Concerns: No; Food, Clothing or Shelter Needs: No; Support System Lacking: No; Transportation Concerns: No Electronic Signature(s) Signed: 02/19/2020 5:39:04 PM By: Deon Pilling Signed: 02/19/2020 5:46:37 PM By: Worthy Keeler PA-C Entered By: Deon Pilling on 02/19/2020 14:27:38 -------------------------------------------------------------------------------- SuperBill Details Patient Name: Date of Service: Micheal Quails D. 02/19/2020 Medical Record Number: [id number] Patient Account Number: [id number] Date of Birth/Sex: Treating RN: November 29, 1935 (84 y.o. Micheal Walls Primary Care Provider: [name] Other Clinician: Referring Provider: Treating Provider/Extender: Brett Albino Weeks in Treatment: 0 Diagnosis Coding ICD-10 Codes Code Description E11.69 Type 2 diabetes mellitus with other specified complication U07.2 Lymphedema, not elsewhere classified I73.89 Other specified peripheral vascular diseases T82.88 Chronic diastolic (congestive) heart failure I10 Essential (primary) hypertension Z89.511 Acquired absence of right leg below knee N18.4 Chronic kidney disease, stage 4 (severe) Facility Procedures CPT4 Code: [id number] Description: 60479 - WOUND CARE VISIT-LEV 3 EST PT Modifier: 25 Quantity: 1 CPT4 Code: [id number] Description: (Facility Use Only) 29581LT - Helper LWR LT LEG Modifier: Quantity: 1 Physician Procedures : CPT4 Code Description Modifier [id number] H. Rivera Colon PHYS LEVEL 3 NEW PT ICD-10 Diagnosis Description E11.69 Type 2 diabetes mellitus with other specified complication T92.7 Lymphedema, not elsewhere classified I73.89 Other specified peripheral vascular diseases  G39.43 Chronic diastolic (congestive) heart failure Quantity: 1 Electronic Signature(s) Signed: 02/19/2020 5:39:55  PM By: Worthy Keeler PA-C Previous Signature: 02/19/2020 5:28:47 PM Version By: Baruch Gouty RN, BSN Entered By: Worthy Keeler on 02/19/2020 17:39:55

## 2020-02-19 NOTE — Progress Notes (Signed)
AVETT, REINECK ([id number]) Visit Report for 02/19/2020 Abuse/Suicide Risk Screen Details Patient Name: Date of Service: Micheal Walls, Micheal Walls 02/19/2020 1:15 PM Medical Record Number: [id number] Patient Account Number: [id number] Date of Birth/Sex: Treating RN: 29-May-1936 (84 y.o. Hessie Diener Primary Care Aiza Vollrath: [name] Other Clinician: Referring Janece Laidlaw: Treating Jacson Rapaport/Extender: Brett Albino Weeks in Treatment: 0 Abuse/Suicide Risk Screen Items Answer ABUSE RISK SCREEN: Has anyone close to you tried to hurt or harm you recentlyo No Do you feel uncomfortable with anyone in your familyo No Has anyone forced you do things that you didnt want to doo No Electronic Signature(s) Signed: 02/19/2020 5:39:04 PM By: Deon Pilling Entered By: Deon Pilling on 02/19/2020 13:28:28 -------------------------------------------------------------------------------- Activities of Daily Living Details Patient Name: Date of Service: Micheal Walls, Micheal Walls 02/19/2020 1:15 PM Medical Record Number: [id number] Patient Account Number: [id number] Date of Birth/Sex: Treating RN: September 30, 1935 (84 y.o. Hessie Diener Primary Care Shalika Arntz: [name] Other Clinician: Referring Dewan Emond: Treating Louella Medaglia/Extender: Brett Albino Weeks in Treatment: 0 Activities of Daily Living Items Answer Activities of Daily Living (Please select one for each item) Drive Automobile Not Able T Medications ake Completely Able Use T elephone Completely Able Care for Appearance Completely Able Use T oilet Completely Able Bath / Shower Completely Able Dress Self Completely Able Feed Self Completely Able Walk Not Able Get In / Out Bed Completely Able Housework Completely Able Prepare Meals Completely [place] for Self Need Assistance Electronic Signature(s) Signed: 02/19/2020 5:39:04 PM By: Deon Pilling Entered By: Deon Pilling on 02/19/2020  13:29:04 -------------------------------------------------------------------------------- Education Screening Details Patient Name: Date of Service: Micheal Quails D. 02/19/2020 1:15 PM Medical Record Number: [id number] Patient Account Number: [id number] Date of Birth/Sex: Treating RN: Dec 16, 1935 (84 y.o. Hessie Diener Primary Care Kwaku Mostafa: [name] Other Clinician: Referring Leviathan Macera: Treating Paulanthony Gleaves/Extender: Edythe Lynn in Treatment: 0 Primary Learner Assessed: Patient Learning Preferences/Education Level/Primary Language Learning Preference: Explanation, Demonstration, Printed Material Highest Education Level: High School Preferred Language: English Cognitive Barrier Language Barrier: No Translator Needed: No Memory Deficit: No Emotional Barrier: No Cultural/Religious Beliefs Affecting Medical Care: No Physical Barrier Impaired Vision: Yes Glasses Impaired Hearing: Yes Hearing Aid, right hearing aid Decreased Hand dexterity: No Knowledge/Comprehension Knowledge Level: High Comprehension Level: High Ability to understand written instructions: High Ability to understand verbal instructions: High Motivation Anxiety Level: Calm Cooperation: Cooperative Education Importance: Acknowledges Need Interest in Health Problems: Asks Questions Perception: Coherent Willingness to Engage in Self-Management High Activities: Readiness to Engage in Self-Management High Activities: Electronic Signature(s) Signed: 02/19/2020 5:39:04 PM By: Deon Pilling Entered By: Deon Pilling on 02/19/2020 13:30:01 -------------------------------------------------------------------------------- Fall Risk Assessment Details Patient Name: Date of Service: Micheal Quails D. 02/19/2020 1:15 PM Medical Record Number: [id number] Patient Account Number: [id number] Date of Birth/Sex: Treating RN: [birth year] (84 y.o. Hessie Diener Primary Care Wilfredo Canterbury: [name] Other Clinician: Referring Kemesha Mosey: Treating Nikitta Sobiech/Extender: Brett Albino Weeks in Treatment: 0 Fall Risk Assessment Items Have you had 2 or more falls in the last 12 monthso 0 No Have you had any fall that resulted in injury in the last 12 monthso 0 No FALLS RISK SCREEN History of falling - immediate or within 3 months 0 No Secondary diagnosis (Do you have 2 or more medical diagnoseso) 0 No Ambulatory aid None/bed rest/wheelchair/nurse 0 Yes Crutches/cane/walker 0 No Furniture 0 No Intravenous therapy Access/Saline/Heparin Lock 0 No Gait/Transferring Normal/ bed rest/ wheelchair 0 Yes  Weak (short steps with or without shuffle, stooped but able to lift head while walking, may seek 0 No support from furniture) Impaired (short steps with shuffle, may have difficulty arising from chair, head down, impaired 0 No balance) Mental Status Oriented to own ability 0 Yes Electronic Signature(s) Signed: 02/19/2020 5:39:04 PM By: Deon Pilling Entered By: Deon Pilling on 02/19/2020 13:31:29 -------------------------------------------------------------------------------- Foot Assessment Details Patient Name: Date of Service: Micheal Quails D. 02/19/2020 1:15 PM Medical Record Number: [id number] Patient Account Number: [id number] Date of Birth/Sex: Treating RN: Oct 12, 1935 (84 y.o. Hessie Diener Primary Care Aliha Diedrich: [name] Other Clinician: Referring Wadsworth Skolnick: Treating Evanee Lubrano/Extender: Brett Albino Weeks in Treatment: 0 Foot Assessment Items Site Locations + = Sensation present, - = Sensation absent, C = Callus, U = Ulcer R = Redness, W = Warmth, M = Maceration, PU = Pre-ulcerative lesion F = Fissure, S = Swelling, D = Dryness Assessment Right: Left: Other Deformity: No No Prior Foot Ulcer: No No Prior Amputation: Yes No Charcot Joint: No No Ambulatory Status: Non-ambulatory Assistance Device: Wheelchair Gait:  Administrator, arts) Signed: 02/19/2020 5:39:04 PM By: Deon Pilling Entered By: Deon Pilling on 02/19/2020 13:44:34 -------------------------------------------------------------------------------- Nutrition Risk Screening Details Patient Name: Date of Service: Micheal Walls, Micheal Walls 02/19/2020 1:15 PM Medical Record Number: [id number] Patient Account Number: [id number] Date of Birth/Sex: Treating RN: 05-28-1936 (84 y.o. Hessie Diener Primary Care Nurah Petrides: [name] Other Clinician: Referring Lovie Agresta: Treating Creedon Danielski/Extender: Brett Albino Weeks in Treatment: 0 Height (in): Weight (lbs): Body Mass Index (BMI): Nutrition Risk Screening Items Score Screening NUTRITION RISK SCREEN: I have an illness or condition that made me change the kind and/or amount of food I eat 2 Yes I eat fewer than two meals per day 0 No I eat few fruits and vegetables, or milk products 0 No I have three or more drinks of beer, liquor or wine almost every day 0 No I have tooth or mouth problems that make it hard for me to eat 0 No I don't always have enough money to buy the food I need 0 No I eat alone most of the time 0 No I take three or more different prescribed or over-the-counter drugs a day 1 Yes Without wanting to, I have lost or gained 10 pounds in the last six months 0 No I am not always physically able to shop, cook and/or feed myself 0 No Nutrition Protocols Good Risk Protocol Provide education on elevated blood Moderate Risk Protocol 0 sugars and impact on wound healing, as applicable High Risk Proctocol Risk Level: Moderate Risk Score: 3 Electronic Signature(s) Signed: 02/19/2020 5:39:04 PM By: Deon Pilling Entered By: Deon Pilling on 02/19/2020 13:32:38

## 2020-02-25 ENCOUNTER — Telehealth: Payer: Self-pay | Admitting: Podiatry

## 2020-02-25 ENCOUNTER — Encounter: Payer: Self-pay | Admitting: Nurse Practitioner

## 2020-02-25 NOTE — Telephone Encounter (Signed)
Returned call, left HIPAA compliant VM. If he calls back, from our standpoint he can continue/start PT at any time. He is being seen at the [facility] now for his leg issues and if they have further recommendations he should discuss with them as well.

## 2020-02-25 NOTE — Telephone Encounter (Signed)
Micheal Walls and stated that he had a pending PT order and would like to know when to continue. Please call

## 2020-02-26 ENCOUNTER — Other Ambulatory Visit: Payer: Self-pay

## 2020-02-26 ENCOUNTER — Encounter (HOSPITAL_BASED_OUTPATIENT_CLINIC_OR_DEPARTMENT_OTHER): Payer: Medicare Other | Admitting: Physician Assistant

## 2020-02-26 DIAGNOSIS — I130 Hypertensive heart and chronic kidney disease with heart failure and stage 1 through stage 4 chronic kidney disease, or unspecified chronic kidney disease: Secondary | ICD-10-CM | POA: Diagnosis not present

## 2020-02-26 DIAGNOSIS — E1151 Type 2 diabetes mellitus with diabetic peripheral angiopathy without gangrene: Secondary | ICD-10-CM | POA: Diagnosis not present

## 2020-02-26 DIAGNOSIS — E1122 Type 2 diabetes mellitus with diabetic chronic kidney disease: Secondary | ICD-10-CM | POA: Diagnosis not present

## 2020-02-26 DIAGNOSIS — I5032 Chronic diastolic (congestive) heart failure: Secondary | ICD-10-CM | POA: Diagnosis not present

## 2020-02-26 DIAGNOSIS — E1169 Type 2 diabetes mellitus with other specified complication: Secondary | ICD-10-CM | POA: Diagnosis not present

## 2020-02-26 DIAGNOSIS — A4152 Sepsis due to Pseudomonas: Secondary | ICD-10-CM | POA: Diagnosis not present

## 2020-02-26 DIAGNOSIS — L97819 Non-pressure chronic ulcer of other part of right lower leg with unspecified severity: Secondary | ICD-10-CM | POA: Diagnosis not present

## 2020-02-26 DIAGNOSIS — I890 Lymphedema, not elsewhere classified: Secondary | ICD-10-CM | POA: Diagnosis not present

## 2020-02-26 DIAGNOSIS — L97829 Non-pressure chronic ulcer of other part of left lower leg with unspecified severity: Secondary | ICD-10-CM | POA: Diagnosis not present

## 2020-02-26 NOTE — Progress Notes (Addendum)
Micheal Walls ([id number]) Visit Report for 02/26/2020 Chief Complaint Document Details Patient Name: Date of Service: Micheal Walls 02/26/2020 9:45 A M Medical Record Number: [id number] Patient Account Number: [id number] Date of Birth/Sex: Treating RN: [birth year] (84 y.o. [name] Primary Care Provider: [name] Other Clinician: Referring Provider: Treating Provider/Extender: Brett Albino Weeks in Treatment: 1 Information Obtained from: Patient Chief Complaint Bilateral LE Lymphedema Electronic Signature(s) Signed: 02/26/2020 10:43:21 AM By: Worthy Keeler PA-C Entered By: Worthy Keeler on 02/26/2020 10:43:21 -------------------------------------------------------------------------------- HPI Details Patient Name: Date of Service: Micheal Quails D. 02/26/2020 9:45 A M Medical Record Number: [id number] Patient Account Number: [id number] Date of Birth/Sex: Treating RN: [birth year] (84 y.o. [name] Primary Care Provider: [name] Other Clinician: Referring Provider: Treating Provider/Extender: Brett Albino Weeks in Treatment: 1 History of Present Illness HPI Description: 02/19/2020 upon evaluation today patient presents today for initial evaluation in clinic concerning issues that has been having with his bilateral lower extremities. He apparently also had a wound on his great toe. With that being said he does have a history of diabetes mellitus type 2, lymphedema, peripheral vascular disease, congestive heart failure, hypertension, right below-knee amputation, and chronic kidney disease stage IV according to records reviewed with his creatinine clearance being below 30. With that being said the legs have been actually worse than what they are currently he does not seem to have any open wounds at this point significantly he does have a small leaking area on the left leg at this point. With that being said the  majority of what I see is a lot of skin changes secondary to the chronic lymphedema to be honest. There does not appear to be any signs of systemic infection or local infection based on what I see today. The patient is doing much better with regard to the wound on his toe as well according to the daughter. He has been seeing podiatry. They do have questions about whether or not he can perform physical therapy to have no limitations on physical therapy with regard to his wounds to be honest. With that being said he questions having some pain in the ankle region which again I am not really the specialist to comment on that therefore I recommended that he probably needs to discuss this with his podiatrist. 02/26/2020 upon evaluation today patient appears to actually be doing extremely well he is completely healed and overall is looking excellent. They did get the compression stockings ordered yesterday so they should be on the way hopefully arriving if not tomorrow than Friday. In the meantime we may just utilize a Ace wrap to try to keep the edema under as much control as possible until he gets his compression stockings. Electronic Signature(s) Signed: 02/26/2020 11:00:43 AM By: Worthy Keeler PA-C Entered By: Worthy Keeler on 02/26/2020 11:00:43 -------------------------------------------------------------------------------- Physical Exam Details Patient Name: Date of Service: [name], [name] 02/26/2020 9:45 A M Medical Record Number: [id number] Patient Account Number: [id number] Date of Birth/Sex: Treating RN: 11-06-1935 (84 y.o. [name] Primary Care Provider: [name] Other Clinician: Referring Provider: Treating Provider/Extender: Brett Albino Weeks in Treatment: 1 Constitutional Well-nourished and well-hydrated in no acute distress. Respiratory normal breathing without difficulty. Psychiatric this patient is able to make decisions and demonstrates  good insight into disease process. Alert and Oriented x 3. pleasant and cooperative. Notes Upon inspection today patient's wounds actually are completely closed there  is no weeping or leaking and overall I feel like he is doing quite well. I think that the compression socks are really what he needs fortunately there on the way and should be to him shortly. Electronic Signature(s) Signed: 02/26/2020 11:01:03 AM By: Worthy Keeler PA-C Entered By: Worthy Keeler on 02/26/2020 11:01:03 -------------------------------------------------------------------------------- Physician Orders Details Patient Name: Date of Service: Micheal Quails D. 02/26/2020 9:45 A M Medical Record Number: [id number] Patient Account Number: [id number] Date of Birth/Sex: Treating RN: 1936/05/06 (84 y.o. [name] Primary Care Provider: [name] Other Clinician: Referring Provider: Treating Provider/Extender: Edythe Lynn in Treatment: 1 Verbal / Phone Orders: No Diagnosis Coding ICD-10 Coding Code Description E11.69 Type 2 diabetes mellitus with other specified complication K93.2 Lymphedema, not elsewhere classified I73.89 Other specified peripheral vascular diseases I71.24 Chronic diastolic (congestive) heart failure I10 Essential (primary) hypertension Z89.511 Acquired absence of right leg below knee N18.4 Chronic kidney disease, stage 4 (severe) Discharge From [facility] Discharge from [place] Moisturizing lotion - to left leg daily Wound Cleansing May shower and wash wound with soap and water. Edema Control Avoid standing for long periods of time Elevate legs to the level of the heart or above for 30 minutes daily and/or when sitting, a frequency of: - throughout the day Exercise regularly Support Garment 20-30 mm/Hg pressure to: - compression stockings to both legs daily Other: - ace wrap left leg, change daily until  stockings available [facility] for wound care. [name] Electronic Signature(s) Signed: 02/26/2020 6:07:28 PM By: Baruch Gouty RN, BSN Signed: 02/26/2020 6:26:36 PM By: Worthy Keeler PA-C Entered By: Baruch Gouty on 02/26/2020 11:00:45 -------------------------------------------------------------------------------- Problem List Details Patient Name: Date of Service: Micheal Quails D. 02/26/2020 9:45 A M Medical Record Number: [id number] Patient Account Number: [id number] Date of Birth/Sex: Treating RN: 1935/08/16 (84 y.o. [name] Primary Care Provider: [name] Other Clinician: Referring Provider: Treating Provider/Extender: Brett Albino Weeks in Treatment: 1 Active Problems ICD-10 Encounter Code Description Active Date MDM Diagnosis E11.69 Type 2 diabetes mellitus with other specified complication 07/18/537 No Yes I89.0 Lymphedema, not elsewhere classified 02/19/2020 No Yes I73.89 Other specified peripheral vascular diseases 02/19/2020 No Yes J67.34 Chronic diastolic (congestive) heart failure 02/19/2020 No Yes I10 Essential (primary) hypertension 02/19/2020 No Yes Z89.511 Acquired absence of right leg below knee 02/19/2020 No Yes N18.4 Chronic kidney disease, stage 4 (severe) 02/19/2020 No Yes Inactive Problems Resolved Problems Electronic Signature(s) Signed: 02/26/2020 10:43:15 AM By: Worthy Keeler PA-C Entered By: Worthy Keeler on 02/26/2020 10:43:15 -------------------------------------------------------------------------------- Progress Note Details Patient Name: Date of Service: Micheal Quails D. 02/26/2020 9:45 A M Medical Record Number: [id number] Patient Account Number: [id number] Date of Birth/Sex: Treating RN: [birth year] (84 y.o. [name] Primary Care Provider: [name] Other Clinician: Referring Provider: Treating Provider/Extender: Brett Albino Weeks  in Treatment: 1 Subjective Chief Complaint Information obtained from Patient Bilateral LE Lymphedema History of Present Illness (HPI) 02/19/2020 upon evaluation today patient presents today for initial evaluation in clinic concerning issues that has been having with his bilateral lower extremities. He apparently also had a wound on his great toe. With that being said he does have a history of diabetes mellitus type 2, lymphedema, peripheral vascular disease, congestive heart failure, hypertension, right below-knee amputation, and chronic kidney disease stage IV according to records reviewed with his creatinine clearance being below 30. With  that being said the legs have been actually worse than what they are currently he does not seem to have any open wounds at this point significantly he does have a small leaking area on the left leg at this point. With that being said the majority of what I see is a lot of skin changes secondary to the chronic lymphedema to be honest. There does not appear to be any signs of systemic infection or local infection based on what I see today. The patient is doing much better with regard to the wound on his toe as well according to the daughter. He has been seeing podiatry. They do have questions about whether or not he can perform physical therapy to have no limitations on physical therapy with regard to his wounds to be honest. With that being said he questions having some pain in the ankle region which again I am not really the specialist to comment on that therefore I recommended that he probably needs to discuss this with his podiatrist. 02/26/2020 upon evaluation today patient appears to actually be doing extremely well he is completely healed and overall is looking excellent. They did get the compression stockings ordered yesterday so they should be on the way hopefully arriving if not tomorrow than Friday. In the meantime we may just utilize a Ace wrap to try to  keep the edema under as much control as possible until he gets his compression stockings. Objective Constitutional Well-nourished and well-hydrated in no acute distress. Vitals Time Taken: 9:56 AM, Height: 73 in, Weight: 276 lbs, BMI: 36.4, Temperature: 97.9 F, Pulse: 83 bpm, Respiratory Rate: 18 breaths/min, Blood Pressure: 164/73 mmHg. Respiratory normal breathing without difficulty. Psychiatric this patient is able to make decisions and demonstrates good insight into disease process. Alert and Oriented x 3. pleasant and cooperative. General Notes: Upon inspection today patient's wounds actually are completely closed there is no weeping or leaking and overall I feel like he is doing quite well. I think that the compression socks are really what he needs fortunately there on the way and should be to him shortly. Assessment Active Problems ICD-10 Type 2 diabetes mellitus with other specified complication Lymphedema, not elsewhere classified Other specified peripheral vascular diseases Chronic diastolic (congestive) heart failure Essential (primary) hypertension Acquired absence of right leg below knee Chronic kidney disease, stage 4 (severe) Plan Discharge From [facility]: Discharge from [place] Skin Barriers/Peri-Wound Care: Moisturizing lotion - to left leg daily Wound Cleansing: May shower and wash wound with soap and water. Edema Control: Avoid standing for long periods of time Elevate legs to the level of the heart or above for 30 minutes daily and/or when sitting, a frequency of: - throughout the day Exercise regularly Support Garment 20-30 mm/Hg pressure to: - compression stockings to both legs daily Other: - ace wrap left leg, change daily until stockings available Home Health: [facility] for wound care. - [facility] 1. I would recommend currently that we go ahead and continue with the wound care measures as before specifically with  regard to compression obviously he has no open wounds but he still needs compression we can use an Ace wrap for now also does not get his compression socks that is good to be the thing to put on. 2. I do recommend he elevate his legs much as possible try to keep the edema under good control he should be sleeping in the bed at night as well as it is he gets  his compression socks and start using those and discontinue the Ace wrap. We will see him back for follow-up visit as needed. Electronic Signature(s) Signed: 02/26/2020 11:01:46 AM By: Worthy Keeler PA-C Entered By: Worthy Keeler on 02/26/2020 11:01:45 -------------------------------------------------------------------------------- SuperBill Details Patient Name: Date of Service: Micheal Quails D. 02/26/2020 Medical Record Number: [id number] Patient Account Number: [id number] Date of Birth/Sex: Treating RN: 1936/01/31 (84 y.o. [name] Primary Care Provider: [name] Other Clinician: Referring Provider: Treating Provider/Extender: Brett Albino Weeks in Treatment: 1 Diagnosis Coding ICD-10 Codes Code Description E11.69 Type 2 diabetes mellitus with other specified complication O99.6 Lymphedema, not elsewhere classified I73.89 Other specified peripheral vascular diseases L24.93 Chronic diastolic (congestive) heart failure I10 Essential (primary) hypertension Z89.511 Acquired absence of right leg below knee N18.4 Chronic kidney disease, stage 4 (severe) Facility Procedures CPT4 Code: [id number] Description: 45848 - WOUND CARE VISIT-LEV 2 EST PT Modifier: Quantity: 1 Physician Procedures Electronic Signature(s) Signed: 02/26/2020 11:01:57 AM By: Worthy Keeler PA-C Entered By: Worthy Keeler on 02/26/2020 11:01:56

## 2020-02-27 ENCOUNTER — Ambulatory Visit: Payer: Medicare Other | Admitting: Podiatry

## 2020-02-27 DIAGNOSIS — Z794 Long term (current) use of insulin: Secondary | ICD-10-CM

## 2020-02-27 DIAGNOSIS — E113553 Type 2 diabetes mellitus with stable proliferative diabetic retinopathy, bilateral: Secondary | ICD-10-CM

## 2020-02-27 DIAGNOSIS — N179 Acute kidney failure, unspecified: Secondary | ICD-10-CM

## 2020-02-27 DIAGNOSIS — N184 Chronic kidney disease, stage 4 (severe): Secondary | ICD-10-CM

## 2020-02-27 DIAGNOSIS — J690 Pneumonitis due to inhalation of food and vomit: Secondary | ICD-10-CM

## 2020-02-28 ENCOUNTER — Encounter (HOSPITAL_BASED_OUTPATIENT_CLINIC_OR_DEPARTMENT_OTHER): Payer: Medicare Other | Admitting: Internal Medicine

## 2020-03-02 DIAGNOSIS — N183 Chronic kidney disease, stage 3 unspecified: Secondary | ICD-10-CM | POA: Diagnosis not present

## 2020-03-02 DIAGNOSIS — I129 Hypertensive chronic kidney disease with stage 1 through stage 4 chronic kidney disease, or unspecified chronic kidney disease: Secondary | ICD-10-CM | POA: Diagnosis not present

## 2020-03-02 DIAGNOSIS — N3289 Other specified disorders of bladder: Secondary | ICD-10-CM | POA: Diagnosis not present

## 2020-03-02 DIAGNOSIS — Z6841 Body Mass Index (BMI) 40.0 and over, adult: Secondary | ICD-10-CM | POA: Diagnosis not present

## 2020-03-02 DIAGNOSIS — M109 Gout, unspecified: Secondary | ICD-10-CM | POA: Diagnosis not present

## 2020-03-02 DIAGNOSIS — E785 Hyperlipidemia, unspecified: Secondary | ICD-10-CM | POA: Diagnosis not present

## 2020-03-02 DIAGNOSIS — E118 Type 2 diabetes mellitus with unspecified complications: Secondary | ICD-10-CM | POA: Diagnosis not present

## 2020-03-02 NOTE — Progress Notes (Signed)
Micheal, Walls ([id number]) Visit Report for 02/26/2020 Arrival Information Details Patient Name: Date of Service: Micheal, Walls 02/26/2020 9:45 A M Medical Record Number: [id number] Patient Account Number: [id number] Date of Birth/Sex: Treating RN: 1935/08/07 (84 y.o. Micheal Walls) Carlene Coria Primary Care Micheal Walls: [name] Other Clinician: Referring Remigio Mcmillon: Treating Ronie Fleeger/Extender: Brett Albino Weeks in Treatment: 1 Visit Information History Since Last Visit All ordered tests and consults were completed: No Patient Arrived: Wheel Chair Added or deleted any medications: No Arrival Time: 09:55 Any new allergies or adverse reactions: No Accompanied By: grandson Had a fall or experienced change in No Transfer Assistance: None activities of daily living that may affect Patient Identification Verified: Yes risk of falls: Secondary Verification Process Completed: Yes Signs or symptoms of abuse/neglect since last visito No Patient Requires Transmission-Based Precautions: No Hospitalized since last visit: No Patient Has Alerts: No Implantable device outside of the clinic excluding No cellular tissue based products placed in the center since last visit: Has Dressing in Place as Prescribed: Yes Has Compression in Place as Prescribed: Yes Pain Present Now: No Electronic Signature(s) Signed: 03/02/2020 1:15:48 PM By: Carlene Coria RN Entered By: Carlene Coria on 02/26/2020 09:56:08 -------------------------------------------------------------------------------- Clinic Level of Care Assessment Details Patient Name: Date of Service: Micheal Walls 02/26/2020 9:45 A M Medical Record Number: [id number] Patient Account Number: [id number] Date of Birth/Sex: Treating RN: 1936/05/20 (84 y.o. Micheal Walls Primary Care Karmel Patricelli: [name] Other Clinician: Referring Micheal Walls: Treating Micheal Walls/Extender: Edythe Lynn in Treatment:  1 Clinic Level of Care Assessment Items TOOL 4 Quantity Score []  - 0 Use when only an EandM is performed on FOLLOW-UP visit ASSESSMENTS - Nursing Assessment / Reassessment X- 1 10 Reassessment of Co-morbidities (includes updates in patient status) X- 1 5 Reassessment of Adherence to Treatment Plan ASSESSMENTS - Wound and Skin A ssessment / Reassessment []  - 0 Simple Wound Assessment / Reassessment - one wound []  - 0 Complex Wound Assessment / Reassessment - multiple wounds X- 1 10 Dermatologic / Skin Assessment (not related to wound area) ASSESSMENTS - Focused Assessment X- 1 5 Circumferential Edema Measurements - multi extremities []  - 0 Nutritional Assessment / Counseling / Intervention X- 1 5 Lower Extremity Assessment (monofilament, tuning fork, pulses) []  - 0 Peripheral Arterial Disease Assessment (using hand held doppler) ASSESSMENTS - Ostomy and/or Continence Assessment and Care []  - 0 Incontinence Assessment and Management []  - 0 Ostomy Care Assessment and Management (repouching, etc.) PROCESS - Coordination of Care []  - 0 Simple Patient / Family Education for ongoing care []  - 0 Complex (extensive) Patient / Family Education for ongoing care X- 1 10 Staff obtains Programmer, systems, Records, T Results / Process Orders est []  - 0 Staff telephones HHA, Nursing Homes / Clarify orders / etc []  - 0 Routine Transfer to another Facility (non-emergent condition) []  - 0 Routine Hospital Admission (non-emergent condition) []  - 0 New Admissions / Biomedical engineer / Ordering NPWT Apligraf, etc. , []  - 0 Emergency Hospital Admission (emergent condition) X- 1 10 Simple Discharge Coordination []  - 0 Complex (extensive) Discharge Coordination PROCESS - Special Needs []  - 0 Pediatric / Minor Patient Management []  - 0 Isolation Patient Management []  - 0 Hearing / Language / Visual special needs []  - 0 Assessment of Community assistance (transportation, D/C planning,  etc.) []  - 0 Additional assistance / Altered mentation []  - 0 Support Surface(s) Assessment (bed, cushion, seat, etc.) INTERVENTIONS - Wound Cleansing / Measurement []  -  0 Simple Wound Cleansing - one wound $RemoveB'[]'mNrUSDXe$  - 0 Complex Wound Cleansing - multiple wounds $RemoveBeforeD'[]'MCLqraDieraFeu$  - 0 Wound Imaging (photographs - any number of wounds) $RemoveBe'[]'zsdGIOrAB$  - 0 Wound Tracing (instead of photographs) $RemoveBeforeD'[]'ndSmywMqlyWGxH$  - 0 Simple Wound Measurement - one wound $RemoveB'[]'xGzOwWpg$  - 0 Complex Wound Measurement - multiple wounds INTERVENTIONS - Wound Dressings X - Small Wound Dressing one or multiple wounds 1 10 $Re'[]'Ama$  - 0 Medium Wound Dressing one or multiple wounds $RemoveBeforeD'[]'OXkyHsbUCFoxkX$  - 0 Large Wound Dressing one or multiple wounds $RemoveBeforeD'[]'yKpBJwuBsUJoOY$  - 0 Application of Medications - topical $RemoveB'[]'eiZIsbtk$  - 0 Application of Medications - injection INTERVENTIONS - Miscellaneous $RemoveBeforeD'[]'FusZVtYwUJqSHx$  - 0 External ear exam $Remove'[]'IRpBOiF$  - 0 Specimen Collection (cultures, biopsies, blood, body fluids, etc.) $RemoveBefor'[]'bPixeDswIaXu$  - 0 Specimen(s) / Culture(s) sent or taken to Lab for analysis $RemoveBefo'[]'NXtPhYzWaHp$  - 0 Patient Transfer (multiple staff / Civil Service fast streamer / Similar devices) $RemoveBeforeDE'[]'AwqhbTFLbaTgpGI$  - 0 Simple Staple / Suture removal (25 or less) $Remove'[]'eMUYTMO$  - 0 Complex Staple / Suture removal (26 or more) $Remove'[]'jYCiBQm$  - 0 Hypo / Hyperglycemic Management (close monitor of Blood Glucose) $RemoveBefore'[]'HNRtUuqPOdObF$  - 0 Ankle / Brachial Index (ABI) - do not check if billed separately X- 1 5 Vital Signs Has the patient been seen at the hospital within the last three years: Yes Total Score: 70 Level Of Care: New/Established - Level 2 Electronic Signature(s) Signed: 02/26/2020 6:07:28 PM By: Baruch Gouty RN, BSN Entered By: Baruch Gouty on 02/26/2020 10:58:15 -------------------------------------------------------------------------------- Encounter Discharge Information Details Patient Name: Date of Service: Micheal Quails D. 02/26/2020 9:45 A M Medical Record Number: [id number] Patient Account Number: [id number] Date of Birth/Sex: Treating RN: [birth year] (84 y.o. Micheal Walls Primary Care Lynnea Vandervoort: [name] Other Clinician: Referring Homer Miller: Treating Windsor Goeken/Extender: Brett Albino Weeks in Treatment: 1 Encounter Discharge Information Items Discharge Condition: Stable Ambulatory Status: Wheelchair Discharge Destination: Home Transportation: Private Auto Accompanied By: grandson Schedule Follow-up Appointment: Yes Clinical Summary of Care: Patient Declined Electronic Signature(s) Signed: 03/02/2020 1:15:48 PM By: Carlene Coria RN Entered By: Carlene Coria on 02/26/2020 11:08:31 -------------------------------------------------------------------------------- Lower Extremity Assessment Details Patient Name: Date of Service: Micheal, Walls 02/26/2020 9:45 A M Medical Record Number: [id number] Patient Account Number: [id number] Date of Birth/Sex: Treating RN: 05-17-1936 (84 y.o. Micheal Walls Primary Care Sitlali Koerner: [name] Other Clinician: Referring Angelo Caroll: Treating Addysyn Fern/Extender: Brett Albino Weeks in Treatment: 1 Edema Assessment Assessed: [Left: Yes] [Right: No] Edema: [Left: Ye] [Right: s] Calf Left: Right: Point of Measurement: 29 cm From Medial Instep 42 cm cm Ankle Left: Right: Point of Measurement: 10 cm From Medial Instep 32.5 cm cm Vascular Assessment Pulses: Dorsalis Pedis Palpable: [Left:Yes] Electronic Signature(s) Signed: 02/26/2020 5:18:19 PM By: Deon Pilling Entered By: Deon Pilling on 02/26/2020 10:01:32 -------------------------------------------------------------------------------- [place] Details Patient Name: Date of Service: Micheal Quails D. 02/26/2020 9:45 A M Medical Record Number: [id number] Patient Account Number: [id number] Date of Birth/Sex: Treating RN: 08/28/1935 (84 y.o. Micheal Walls Primary Care Crystin Lechtenberg: [name] Other Clinician: Referring Brendaliz Kuk: Treating Orvis Stann/Extender: Brett Albino Weeks in Treatment: 1 Active  Inactive Electronic Signature(s) Signed: 02/26/2020 6:07:28 PM By: Baruch Gouty RN, BSN Entered By: Baruch Gouty on 02/26/2020 11:03:09 -------------------------------------------------------------------------------- Pain Assessment Details Patient Name: Date of Service: Micheal Quails D. 02/26/2020 9:45 A M Medical Record Number: [id number] Patient Account Number: [id number] Date of Birth/Sex: Treating RN: 1935-06-19 (84 y.o. Micheal Walls Primary Care Marishka Rentfrow: [name] Other Clinician: Referring Keamber Macfadden: Treating Iyana Topor/Extender: Moody Bruins, Doreene Burke  Weeks in Treatment: 1 Active Problems Location of Pain Severity and Description of Pain Patient Has Paino No Site Locations Pain Management and Medication Current Pain Management: Electronic Signature(s) Signed: 03/02/2020 1:15:48 PM By: Carlene Coria RN Entered By: Carlene Coria on 02/26/2020 09:56:38 -------------------------------------------------------------------------------- Patient/Caregiver Education Details Patient Name: Date of Service: Micheal Walls 9/15/2021andnbsp9:45 A M Medical Record Number: [id number] Patient Account Number: [id number] Date of Birth/Gender: Treating RN: [birth year] (84 y.o. Micheal Walls Primary Care Physician: [name] Other Clinician: Referring Physician: Treating Physician/Extender: Edythe Lynn in Treatment: 1 Education Assessment Education Provided To: Patient Education Topics Provided Venous: Methods: Explain/Verbal Responses: Reinforcements needed, State content correctly Electronic Signature(s) Signed: 02/26/2020 6:07:28 PM By: Baruch Gouty RN, BSN Entered By: Baruch Gouty on 02/26/2020 10:57:23 -------------------------------------------------------------------------------- [place] Details Patient Name: Date of Service: Micheal Quails D. 02/26/2020 9:45 A M Medical Record Number: [id number] Patient  Account Number: [id number] Date of Birth/Sex: Treating RN: 02-07-1936 (84 y.o. Micheal Walls) Carlene Coria Primary Care Dillard Pascal: [name] Other Clinician: Referring Sami Froh: Treating Leor Whyte/Extender: Brett Albino Weeks in Treatment: 1 Vital Signs Time Taken: 09:56 Temperature (F): 97.9 Height (in): 73 Pulse (bpm): 83 Weight (lbs): 276 Respiratory Rate (breaths/min): 18 Body Mass Index (BMI): 36.4 Blood Pressure (mmHg): 164/73 Reference Range: 80 - 120 mg / dl Electronic Signature(s) Signed: 03/02/2020 1:15:48 PM By: Carlene Coria RN Entered By: Carlene Coria on 02/26/2020 09:56:32

## 2020-03-10 ENCOUNTER — Telehealth: Payer: Self-pay

## 2020-03-10 NOTE — Telephone Encounter (Signed)
Shanon Brow PT with [facility] called requesting an extension to pt orders for 2 times a week for 3 weeks. Pt is getting his new prothesis on Thurs and he is hoping to progress ambulation with that. [phone]  I returned his call and gave verbal orders ok per JM. YL,RMA

## 2020-03-25 ENCOUNTER — Other Ambulatory Visit: Payer: Self-pay | Admitting: Nurse Practitioner

## 2020-04-03 ENCOUNTER — Encounter (HOSPITAL_BASED_OUTPATIENT_CLINIC_OR_DEPARTMENT_OTHER): Payer: Medicare Other | Attending: Internal Medicine | Admitting: Internal Medicine

## 2020-04-03 ENCOUNTER — Other Ambulatory Visit: Payer: Self-pay

## 2020-04-03 DIAGNOSIS — Z23 Encounter for immunization: Secondary | ICD-10-CM | POA: Diagnosis not present

## 2020-04-03 DIAGNOSIS — L97521 Non-pressure chronic ulcer of other part of left foot limited to breakdown of skin: Secondary | ICD-10-CM | POA: Diagnosis not present

## 2020-04-03 DIAGNOSIS — Z89511 Acquired absence of right leg below knee: Secondary | ICD-10-CM | POA: Insufficient documentation

## 2020-04-03 DIAGNOSIS — E11621 Type 2 diabetes mellitus with foot ulcer: Secondary | ICD-10-CM | POA: Insufficient documentation

## 2020-04-03 DIAGNOSIS — E1151 Type 2 diabetes mellitus with diabetic peripheral angiopathy without gangrene: Secondary | ICD-10-CM | POA: Diagnosis not present

## 2020-04-03 DIAGNOSIS — L97522 Non-pressure chronic ulcer of other part of left foot with fat layer exposed: Secondary | ICD-10-CM | POA: Diagnosis not present

## 2020-04-03 NOTE — Progress Notes (Signed)
KEANEN, DOHSE ([id number]) Visit Report for 04/03/2020 Allergy List Details Patient Name: Date of Service: Micheal Walls, Micheal Walls 04/03/2020 9:00 A M Medical Record Number: [id number] Patient Account Number: [id number] Date of Birth/Sex: Treating RN: [birth year] (84 y.o. Micheal Walls Primary Care Lenah Messenger: [name] Other Clinician: Referring Kaileen Bronkema: Treating Beryl Balz/Extender: Orlie Pollen Weeks in Treatment: 0 Allergies Active Allergies No Known Drug Allergies Allergy Notes Electronic Signature(s) Signed: 04/03/2020 5:00:52 PM By: Kela Millin Entered By: Kela Millin on 04/03/2020 09:22:34 -------------------------------------------------------------------------------- Arrival Information Details Patient Name: Date of Service: Micheal Quails D. 04/03/2020 9:00 A M Medical Record Number: [id number] Patient Account Number: [id number] Date of Birth/Sex: Treating RN: 1936-06-11 (84 y.o. Micheal Walls Primary Care Kiley Torrence: [name] Other Clinician: Referring Leaha Cuervo: Treating Saray Capasso/Extender: Lynden Ang in Treatment: 0 Visit Information Patient Arrived: Wheel Chair Arrival Time: 09:10 Accompanied By: self Transfer Assistance: None Patient Identification Verified: Yes Secondary Verification Process Completed: Yes History Since Last Visit Electronic Signature(s) Signed: 04/03/2020 5:00:52 PM By: Kela Millin Entered By: Kela Millin on 04/03/2020 09:21:38 -------------------------------------------------------------------------------- Clinic Level of Care Assessment Details Patient Name: Date of Service: Micheal Walls, Micheal D. 04/03/2020 9:00 A M Medical Record Number: [id number] Patient Account Number: [id number] Date of Birth/Sex: Treating RN: 21-Jun-1935 (84 y.o. Micheal Walls Primary Care Quinta Eimer: [name] Other Clinician: Referring Ilyana Manuele: Treating  Donica Derouin/Extender: Lynden Ang in Treatment: 0 Clinic Level of Care Assessment Items TOOL 1 Quantity Score []  - 0 Use when EandM and Procedure is performed on INITIAL visit ASSESSMENTS - Nursing Assessment / Reassessment X- 1 20 General Physical Exam (combine w/ comprehensive assessment (listed just below) when performed on new pt. evals) X- 1 25 Comprehensive Assessment (HX, ROS, Risk Assessments, Wounds Hx, etc.) ASSESSMENTS - Wound and Skin Assessment / Reassessment []  - 0 Dermatologic / Skin Assessment (not related to wound area) ASSESSMENTS - Ostomy and/or Continence Assessment and Care []  - 0 Incontinence Assessment and Management []  - 0 Ostomy Care Assessment and Management (repouching, etc.) PROCESS - Coordination of Care X - Simple Patient / Family Education for ongoing care 1 15 []  - 0 Complex (extensive) Patient / Family Education for ongoing care X- 1 10 Staff obtains Programmer, systems, Records, T Results / Process Orders est []  - 0 Staff telephones HHA, Nursing Homes / Clarify orders / etc []  - 0 Routine Transfer to another Facility (non-emergent condition) []  - 0 Routine Hospital Admission (non-emergent condition) X- 1 15 New Admissions / Biomedical engineer / Ordering NPWT Apligraf, etc. , []  - 0 Emergency Hospital Admission (emergent condition) PROCESS - Special Needs []  - 0 Pediatric / Minor Patient Management []  - 0 Isolation Patient Management []  - 0 Hearing / Language / Visual special needs []  - 0 Assessment of Community assistance (transportation, D/C planning, etc.) []  - 0 Additional assistance / Altered mentation []  - 0 Support Surface(s) Assessment (bed, cushion, seat, etc.) INTERVENTIONS - Miscellaneous []  - 0 External ear exam []  - 0 Patient Transfer (multiple staff / Civil Service fast streamer / Similar devices) []  - 0 Simple Staple / Suture removal (25 or less) []  - 0 Complex Staple / Suture removal (26 or more) []  -  0 Hypo/Hyperglycemic Management (do not check if billed separately) X- 1 15 Ankle / Brachial Index (ABI) - do not check if billed separately Has the patient been seen at the hospital within the last three years: Yes Total Score: 100 Level Of Care: New/Established - Level 3 Electronic Signature(s) Signed:  04/03/2020 5:21:31 PM By: Baruch Gouty RN, BSN Entered By: Baruch Gouty on 04/03/2020 31:49:70 -------------------------------------------------------------------------------- Encounter Discharge Information Details Patient Name: Date of Service: Micheal Quails D. 04/03/2020 9:00 A M Medical Record Number: [id number] Patient Account Number: [id number] Date of Birth/Sex: Treating RN: December 27, 1935 (84 y.o. Micheal Walls Primary Care Hyde Sires: [name] Other Clinician: Referring Auri Jahnke: Treating Javione Gunawan/Extender: Lynden Ang in Treatment: 0 Encounter Discharge Information Items Post Procedure Vitals Discharge Condition: Stable Temperature (F): 98.1 Ambulatory Status: Wheelchair Pulse (bpm): 111 Discharge Destination: Home Respiratory Rate (breaths/min): 19 Transportation: Private Auto Blood Pressure (mmHg): 138/73 Accompanied By: self Schedule Follow-up Appointment: Yes Clinical Summary of Care: Patient Declined Electronic Signature(s) Signed: 04/03/2020 5:00:52 PM By: Kela Millin Entered By: Kela Millin on 04/03/2020 10:27:14 -------------------------------------------------------------------------------- Lower Extremity Assessment Details Patient Name: Date of Service: Micheal Quails D. 04/03/2020 9:00 A M Medical Record Number: [id number] Patient Account Number: [id number] Date of Birth/Sex: Treating RN: 25-Nov-1935 (84 y.o. Micheal Walls Primary Care Chasity Outten: [name] Other Clinician: Referring Alayla Dethlefs: Treating Laryn Venning/Extender: Lynden Ang in Treatment: 0 Edema  Assessment Assessed: Shirlyn Goltz: No] [Right: No] E[Left: dema] [Right: :] Calf Left: Right: Point of Measurement: 40 cm From Medial Instep 44.5 cm Ankle Left: Right: Point of Measurement: 13 cm From Medial Instep 33.5 cm Vascular Assessment Pulses: Dorsalis Pedis Palpable: [Left:No] Blood Pressure: Brachial: [Left:138] Ankle: [Left:Dorsalis Pedis: 72 0.52] Electronic Signature(s) Signed: 04/03/2020 5:00:52 PM By: Kela Millin Entered By: Kela Millin on 04/03/2020 09:45:16 -------------------------------------------------------------------------------- Multi Wound Chart Details Patient Name: Date of Service: Micheal Quails D. 04/03/2020 9:00 A M Medical Record Number: [id number] Patient Account Number: [id number] Date of Birth/Sex: Treating RN: 12-08-1935 (84 y.o. Micheal Walls Primary Care Kenzlee Fishburn: [name] Other Clinician: Referring Frankie Zito: Treating Olanna Percifield/Extender: Lynden Ang in Treatment: 0 Vital Signs Height(in): 73 Capillary Blood Glucose(mg/dl): 98 Weight(lbs): 275 Pulse(bpm): 111 Body Mass Index(BMI): 36 Blood Pressure(mmHg): 138/73 Temperature(F): 98.1 Respiratory Rate(breaths/min): 19 Photos: [1:No Photos Left T Great oe] [N/A:N/A N/A] Wound Location: [1:Blister] [N/A:N/A] Wounding Event: [1:Diabetic Wound/Ulcer of the Lower] [N/A:N/A] Primary Etiology: [1:Extremity Cataracts, Anemia, Lymphedema,] [N/A:N/A] Comorbid History: [1:Sleep Apnea, Congestive Heart Failure, Hypertension, Peripheral Arterial Disease, Peripheral Venous Disease, Type II Diabetes, End Stage Renal Disease, Gout, Neuropathy 03/27/2020] [N/A:N/A] Date Acquired: [1:0] [N/A:N/A] Weeks of Treatment: [1:Open] [N/A:N/A] Wound Status: [1:0.2x0.2x0.6] [N/A:N/A] Measurements L x W x D (cm) [1:0.031] [N/A:N/A] A (cm) : rea [1:0.019] [N/A:N/A] Volume (cm) : [1:12] Starting Position 1 (o'clock): [1:12] Ending Position 1 (o'clock):  [1:0.8] Maximum Distance 1 (cm): [1:Yes] [N/A:N/A] Undermining: [1:Grade 2] [N/A:N/A] Classification: [1:Medium] [N/A:N/A] Exudate A mount: [1:Serosanguineous] [N/A:N/A] Exudate Type: [1:red, brown] [N/A:N/A] Exudate Color: [1:Well defined, not attached] [N/A:N/A] Wound Margin: [1:Large (67-100%)] [N/A:N/A] Granulation A mount: [1:Pink] [N/A:N/A] Granulation Quality: [1:Small (1-33%)] [N/A:N/A] Necrotic A mount: [1:Fat Layer (Subcutaneous Tissue): Yes N/A] Exposed Structures: [1:Fascia: No Tendon: No Muscle: No Joint: No Bone: No None] [N/A:N/A] Epithelialization: [1:Debridement - Excisional] [N/A:N/A] Debridement: Pre-procedure Verification/Time Out 10:15 [N/A:N/A] Taken: [1:Subcutaneous, Slough] [N/A:N/A] Tissue Debrided: [1:Skin/Subcutaneous Tissue] [N/A:N/A] Level: [1:0.42] [N/A:N/A] Debridement A (sq cm): [1:rea Curette] [N/A:N/A] Instrument: [1:Minimum] [N/A:N/A] Bleeding: [1:Pressure] [N/A:N/A] Hemostasis A chieved: [1:0] [N/A:N/A] Procedural Pain: [1:0] [N/A:N/A] Post Procedural Pain: [1:Procedure was tolerated well] [N/A:N/A] Debridement Treatment Response: [1:0.4x0.5x0.1] [N/A:N/A] Post Debridement Measurements L x W x D (cm) [1:0.016] [N/A:N/A] Post Debridement Volume: (cm) [1:Debridement] [N/A:N/A] Treatment Notes Electronic Signature(s) Signed: 04/03/2020 4:52:36 PM By: Linton Ham MD Signed: 04/03/2020 5:21:31 PM By: Baruch Gouty RN, BSN Entered  By: Linton Ham on 04/03/2020 10:24:06 -------------------------------------------------------------------------------- Multi-Disciplinary Care Plan Details Patient Name: Date of Service: Micheal Walls, Micheal Walls 04/03/2020 9:00 A M Medical Record Number: [id number] Patient Account Number: [id number] Date of Birth/Sex: Treating RN: 1936-04-08 (84 y.o. Micheal Walls Primary Care Sukhraj Esquivias: [name] Other Clinician: Referring Savannah Morford: Treating Grason Brailsford/Extender: Lynden Ang  in Treatment: 0 Active Inactive Nutrition Nursing Diagnoses: Impaired glucose control: actual or potential Potential for alteratiion in Nutrition/Potential for imbalanced nutrition Goals: Patient/caregiver will maintain therapeutic glucose control Date Initiated: 04/03/2020 Target Resolution Date: 05/01/2020 Goal Status: Active Interventions: Assess HgA1c results as ordered upon admission and as needed Provide education on elevated blood sugars and impact on wound healing Treatment Activities: Patient referred to Primary Care Physician for further nutritional evaluation : 04/03/2020 Notes: Wound/Skin Impairment Nursing Diagnoses: Impaired tissue integrity Knowledge deficit related to ulceration/compromised skin integrity Goals: Patient/caregiver will verbalize understanding of skin care regimen Date Initiated: 04/03/2020 Target Resolution Date: 05/01/2020 Goal Status: Active Ulcer/skin breakdown will have a volume reduction of 50% by week 8 Date Initiated: 04/03/2020 Target Resolution Date: 05/01/2020 Goal Status: Active Interventions: Assess patient/caregiver ability to obtain necessary supplies Assess patient/caregiver ability to perform ulcer/skin care regimen upon admission and as needed Assess ulceration(s) every visit Provide education on ulcer and skin care Treatment Activities: Skin care regimen initiated : 04/03/2020 Topical wound management initiated : 04/03/2020 Notes: Electronic Signature(s) Signed: 04/03/2020 5:21:31 PM By: Baruch Gouty RN, BSN Entered By: Baruch Gouty on 04/03/2020 10:14:38 -------------------------------------------------------------------------------- Pain Assessment Details Patient Name: Date of Service: Micheal Quails D. 04/03/2020 9:00 [place] Record Number: [id number] Patient Account Number: [id number] Date of Birth/Sex: Treating RN: 1935-10-19 (84 y.o. Micheal Walls Primary Care Arbutus Nelligan: [name] Other  Clinician: Referring Nelva Hauk: Treating Elad Macphail/Extender: Lynden Ang in Treatment: 0 Active Problems Location of Pain Severity and Description of Pain Patient Has Paino No Site Locations Pain Management and Medication Current Pain Management: Electronic Signature(s) Signed: 04/03/2020 5:00:52 PM By: Kela Millin Entered By: Kela Millin on 04/03/2020 09:33:40 -------------------------------------------------------------------------------- Patient/Caregiver Education Details Patient Name: Date of Service: Micheal Walls 10/22/2021andnbsp9:00 A M Medical Record Number: [id number] Patient Account Number: [id number] Date of Birth/Gender: Treating RN: 1936/06/13 (84 y.o. Micheal Walls Primary Care Physician: [name] Other Clinician: Referring Physician: Treating Physician/Extender: Lynden Ang in Treatment: 0 Education Assessment Education Provided To: Patient Education Topics Provided Elevated Blood Sugar/ Impact on Healing: Methods: Explain/Verbal Responses: Reinforcements needed, State content correctly Wound/Skin Impairment: Methods: Explain/Verbal Responses: Reinforcements needed, State content correctly Electronic Signature(s) Signed: 04/03/2020 5:21:31 PM By: Baruch Gouty RN, BSN Entered By: Baruch Gouty on 04/03/2020 10:16:02 -------------------------------------------------------------------------------- Wound Assessment Details Patient Name: Date of Service: Micheal Quails D. 04/03/2020 9:00 A M Medical Record Number: [id number] Patient Account Number: [id number] Date of Birth/Sex: Treating RN: Jun 15, 1935 (84 y.o. Micheal Walls Primary Care Elic Vencill: [name] Other Clinician: Referring Nance Mccombs: Treating Nyesha Cliff/Extender: Lynden Ang in Treatment: 0 Wound Status Wound Number: 1 Primary Diabetic Wound/Ulcer of the Lower  Extremity Etiology: Wound Location: Left T Great oe Wound Open Wounding Event: Blister Status: Date Acquired: 03/27/2020 Comorbid Cataracts, Anemia, Lymphedema, Sleep Apnea, Congestive Heart Weeks Of Treatment: 0 History: Failure, Hypertension, Peripheral Arterial Disease, Peripheral Venous Clustered Wound: No Disease, Type II Diabetes, End Stage Renal Disease, Gout, Neuropathy Wound Measurements Length: (cm) 0.2 Width: (cm) 0.2 Depth: (cm) 0.6 Area: (cm) 0.031 Volume: (cm) 0.019 % Reduction in Area: % Reduction in Volume: Epithelialization: None  Tunneling: No Undermining: Yes Starting Position (o'clock): 12 Ending Position (o'clock): 12 Maximum Distance: (cm) 0.8 Wound Description Classification: Grade 2 Wound Margin: Well defined, not attached Exudate Amount: Medium Exudate Type: Serosanguineous Exudate Color: red, brown Foul Odor After Cleansing: No Slough/Fibrino Yes Wound Bed Granulation Amount: Large (67-100%) Exposed Structure Granulation Quality: Pink Fascia Exposed: No Necrotic Amount: Small (1-33%) Fat Layer (Subcutaneous Tissue) Exposed: Yes Necrotic Quality: Adherent Slough Tendon Exposed: No Muscle Exposed: No Joint Exposed: No Bone Exposed: No Treatment Notes Wound #1 (Left Toe Great) 1. Cleanse With Wound Cleanser 2. Periwound Care Skin Prep 3. Primary Dressing Applied Collegen [employer] 4. Secondary Dressing Dry Gauze Roll Gauze 5. Secured With Tape Notes moisten collagen with saline. netting Electronic Signature(s) Signed: 04/03/2020 5:00:52 PM By: Kela Millin Entered By: Kela Millin on 04/03/2020 09:33:30 -------------------------------------------------------------------------------- Vitals Details Patient Name: Date of Service: Micheal Quails D. 04/03/2020 9:00 A M Medical Record Number: [id number] Patient Account Number: [id number] Date of Birth/Sex: Treating RN: 1936-04-24 (84 y.o. Micheal Walls Primary Care  Kara Mierzejewski: [name] Other Clinician: Referring Keontay Vora: Treating Delaynie Stetzer/Extender: Lynden Ang in Treatment: 0 Vital Signs Time Taken: 09:20 Temperature (F): 98.1 Height (in): 73 Pulse (bpm): 111 Source: Stated Respiratory Rate (breaths/min): 19 Weight (lbs): 275 Blood Pressure (mmHg): 138/73 Source: Stated Capillary Blood Glucose (mg/dl): 98 Body Mass Index (BMI): 36.3 Reference Range: 80 - 120 mg / dl Notes patient stated CBG this morning was 98 Electronic Signature(s) Signed: 04/03/2020 5:00:52 PM By: Kela Millin Entered By: Kela Millin on 04/03/2020 09:22:26

## 2020-04-03 NOTE — Progress Notes (Signed)
KINNEY, SACKMANN ([id number]) Visit Report for 04/03/2020 Abuse/Suicide Risk Screen Details Patient Name: Date of Service: Micheal Walls, Micheal Walls 04/03/2020 9:00 A M Medical Record Number: [id number] Patient Account Number: [id number] Date of Birth/Sex: Treating RN: 1936-04-15 (84 y.o. Marvis Repress Primary Care Jaeden Westbay: [name] Other Clinician: Referring Evangelos Paulino: Treating Kashmere Staffa/Extender: Lynden Ang in Treatment: 0 Abuse/Suicide Risk Screen Items Answer ABUSE RISK SCREEN: Has anyone close to you tried to hurt or harm you recentlyo No Do you feel uncomfortable with anyone in your familyo No Has anyone forced you do things that you didnt want to doo No Electronic Signature(s) Signed: 04/03/2020 5:00:52 PM By: Kela Millin Entered By: Kela Millin on 04/03/2020 09:26:01 -------------------------------------------------------------------------------- Activities of Daily Living Details Patient Name: Date of Service: Micheal, Walls 04/03/2020 9:00 A M Medical Record Number: [id number] Patient Account Number: [id number] Date of Birth/Sex: Treating RN: 23-Sep-1935 (84 y.o. Marvis Repress Primary Care Hermilo Dutter: [name] Other Clinician: Referring Zissel [name]: Treating Vipul Cafarelli/Extender: Lynden Ang in Treatment: 0 Activities of Daily Living Items Answer Activities of Daily Living (Please select one for each item) Drive Automobile Not Able T Medications ake Completely Able Use T elephone Completely Able Care for Appearance Need Assistance Use T oilet Need Assistance Bath / Shower Need Assistance Dress Self Need Assistance Feed Self Completely Able Walk Need Assistance Get In / Out Bed Need Assistance Housework Need Assistance Prepare Meals Need Assistance Handle Money Completely Able Shop for Self Need Assistance Electronic Signature(s) Signed: 04/03/2020 5:00:52 PM By: Kela Millin Entered By: Kela Millin on 04/03/2020 09:27:09 -------------------------------------------------------------------------------- Education Screening Details Patient Name: Date of Service: Micheal Quails D. 04/03/2020 9:00 A M Medical Record Number: [id number] Patient Account Number: [id number] Date of Birth/Sex: Treating RN: 1935-08-24 (84 y.o. Marvis Repress Primary Care Kamarri Lovvorn: [name] Other Clinician: Referring Sui Kasparek: Treating Jaque Dacy/Extender: Lynden Ang in Treatment: 0 Primary Learner Assessed: Patient Learning Preferences/Education Level/Primary Language Learning Preference: Explanation Preferred Language: English Cognitive Barrier Language Barrier: No Translator Needed: No Memory Deficit: No Emotional Barrier: No Cultural/Religious Beliefs Affecting Medical Care: No Physical Barrier Impaired Vision: No Impaired Hearing: No Decreased Hand dexterity: No Knowledge/Comprehension Knowledge Level: High Comprehension Level: High Ability to understand written instructions: High Ability to understand verbal instructions: High Motivation Anxiety Level: Calm Cooperation: Cooperative Education Importance: Acknowledges Need Interest in Health Problems: Asks Questions Perception: Coherent Willingness to Engage in Self-Management High Activities: Readiness to Engage in Self-Management High Activities: Electronic Signature(s) Signed: 04/03/2020 5:00:52 PM By: Kela Millin Entered By: Kela Millin on 04/03/2020 09:27:36 -------------------------------------------------------------------------------- Fall Risk Assessment Details Patient Name: Date of Service: Micheal Quails D. 04/03/2020 9:00 A M Medical Record Number: [id number] Patient Account Number: [id number] Date of Birth/Sex: Treating RN: 09-May-1936 (84 y.o. Marvis Repress Primary Care Kyrin Gratz: [name] Other Clinician: Referring  Jeyla Bulger: Treating Ruford Dudzinski/Extender: Lynden Ang in Treatment: 0 Fall Risk Assessment Items Have you had 2 or more falls in the last 12 monthso 0 No Have you had any fall that resulted in injury in the last 12 monthso 0 No FALLS RISK SCREEN History of falling - immediate or within 3 months 0 No Secondary diagnosis (Do you have 2 or more medical diagnoseso) 15 Yes Ambulatory aid None/bed rest/wheelchair/nurse 0 Yes Crutches/cane/walker 0 No Furniture 0 No Intravenous therapy Access/Saline/Heparin Lock 0 No Gait/Transferring Normal/ bed rest/ wheelchair 0 Yes Weak (short steps with or without shuffle, stooped but able to lift head while  walking, may seek 0 No support from furniture) Impaired (short steps with shuffle, may have difficulty arising from chair, head down, impaired 0 No balance) Mental Status Oriented to own ability 0 Yes Electronic Signature(s) Signed: 04/03/2020 5:00:52 PM By: Kela Millin Entered By: Kela Millin on 04/03/2020 09:28:04 -------------------------------------------------------------------------------- Foot Assessment Details Patient Name: Date of Service: Micheal Quails D. 04/03/2020 9:00 A M Medical Record Number: [id number] Patient Account Number: [id number] Date of Birth/Sex: Treating RN: 1936/05/26 (84 y.o. Marvis Repress Primary Care Ellison Rieth: [name] Other Clinician: Referring Tyshauna Finkbiner: Treating Magon Croson/Extender: Lynden Ang in Treatment: 0 Foot Assessment Items Site Locations + = Sensation present, - = Sensation absent, C = Callus, U = Ulcer R = Redness, W = Warmth, M = Maceration, PU = Pre-ulcerative lesion F = Fissure, S = Swelling, D = Dryness Assessment Right: Left: Other Deformity: No No Prior Foot Ulcer: No Yes Prior Amputation: Yes No Charcot Joint: No No Ambulatory Status: Ambulatory With Help Assistance Device: Wheelchair Gait: Buyer, retail  Signature(s) Signed: 04/03/2020 5:00:52 PM By: Kela Millin Entered By: Kela Millin on 04/03/2020 09:29:33 -------------------------------------------------------------------------------- Nutrition Risk Screening Details Patient Name: Date of Service: Micheal, Walls D. 04/03/2020 9:00 A M Medical Record Number: [id number] Patient Account Number: [id number] Date of Birth/Sex: Treating RN: 1936/03/23 (84 y.o. Marvis Repress Primary Care Eldo Umanzor: [name] Other Clinician: Referring Decari Duggar: Treating Essie Lagunes/Extender: Lynden Ang in Treatment: 0 Height (in): 73 Weight (lbs): 275 Body Mass Index (BMI): 36.3 Nutrition Risk Screening Items Score Screening NUTRITION RISK SCREEN: I have an illness or condition that made me change the kind and/or amount of food I eat 2 Yes I eat fewer than two meals per day 0 No I eat few fruits and vegetables, or milk products 0 No I have three or more drinks of beer, liquor or wine almost every day 0 No I have tooth or mouth problems that make it hard for me to eat 0 No I don't always have enough money to buy the food I need 0 No I eat alone most of the time 0 No I take three or more different prescribed or over-the-counter drugs a day 1 Yes Without wanting to, I have lost or gained 10 pounds in the last six months 0 No I am not always physically able to shop, cook and/or feed myself 0 No Nutrition Protocols Good Risk Protocol Moderate Risk Protocol 0 Provide education on nutrition High Risk Proctocol Risk Level: Moderate Risk Score: 3 Electronic Signature(s) Signed: 04/03/2020 5:00:52 PM By: Kela Millin Entered By: Kela Millin on 04/03/2020 09:32:67

## 2020-04-06 NOTE — Progress Notes (Signed)
Micheal Walls, [place] ([id number]) Visit Report for 04/03/2020 Chief Complaint Document Details Patient Name: Date of Service: DRYDEN, TAPLEY 04/03/2020 9:00 A M Medical Record Number: [id number] Patient Account Number: [id number] Date of Birth/Sex: Treating RN: 11-30-1935 (84 y.o. [name] Primary Care Provider: [name] Other Clinician: Referring Provider: Treating Provider/Extender: Lynden Ang in Treatment: 0 Information Obtained from: Patient Chief Complaint Bilateral LE Lymphedema 04/03/2020 patient is here for review of wound over the inter phalangeal joint of the left great toe Electronic Signature(s) Signed: 04/03/2020 4:52:36 PM By: Linton Ham MD Entered By: Linton Ham on 04/03/2020 10:24:34 -------------------------------------------------------------------------------- Debridement Details Patient Name: Date of Service: [name] 04/03/2020 9:00 A M Medical Record Number: [id number] Patient Account Number: [id number] Date of Birth/Sex: Treating RN: 23-Feb-1936 (84 y.o. Ulyses Amor, Vaughan Basta Primary Care Provider: [name] Other Clinician: Referring Provider: Treating Provider/Extender: Lynden Ang in Treatment: 0 Debridement Performed for Assessment: Wound #1 Left T Great oe Performed By: Physician Ricard Dillon., MD Debridement Type: Debridement Severity of Tissue Pre Debridement: Fat layer exposed Level of Consciousness (Pre-procedure): Awake and Alert Pre-procedure Verification/Time Out Yes - 10:15 Taken: Start Time: 10:15 T Area Debrided (L x W): otal 0.6 (cm) x 0.7 (cm) = 0.42 (cm) Tissue and other material debrided: Viable, Non-Viable, Slough, Subcutaneous, Skin: Epidermis, Slough Level: Skin/Subcutaneous Tissue Debridement Description: Excisional Instrument: Curette Bleeding: Minimum Hemostasis Achieved: Pressure End Time: 10:17 Procedural Pain: 0 Post  Procedural Pain: 0 Response to Treatment: Procedure was tolerated well Level of Consciousness (Post- Awake and Alert procedure): Post Debridement Measurements of Total Wound Length: (cm) 0.4 Width: (cm) 0.5 Depth: (cm) 0.1 Volume: (cm) 0.016 Character of Wound/Ulcer Post Debridement: Improved Severity of Tissue Post Debridement: Fat layer exposed Post Procedure Diagnosis Same as Pre-procedure Electronic Signature(s) Signed: 04/03/2020 4:52:36 PM By: Linton Ham MD Signed: 04/03/2020 5:21:31 PM By: Baruch Gouty RN, BSN Entered By: Baruch Gouty on 04/03/2020 10:19:20 -------------------------------------------------------------------------------- HPI Details Patient Name: Date of Service: [name] 04/03/2020 9:00 A M Medical Record Number: [id number] Patient Account Number: [id number] Date of Birth/Sex: Treating RN: [birth year] (84 y.o. [name] Primary Care Provider: [name] Other Clinician: Referring Provider: Treating Provider/Extender: Lynden Ang in Treatment: 0 History of Present Illness HPI Description: 02/19/2020 upon evaluation today patient presents today for initial evaluation in clinic concerning issues that has been having with his bilateral lower extremities. He apparently also had a wound on his great toe. With that being said he does have a history of diabetes mellitus type 2, lymphedema, peripheral vascular disease, congestive heart failure, hypertension, right below-knee amputation, and chronic kidney disease stage IV according to records reviewed with his creatinine clearance being below 30. With that being said the legs have been actually worse than what they are currently he does not seem to have any open wounds at this point significantly he does have a small leaking area on the left leg at this point. With that being said the majority of what I see is a lot of skin changes secondary to the chronic  lymphedema to be honest. There does not appear to be any signs of systemic infection or local infection based on what I see today. The patient is doing much better with regard to the wound on his toe as well according to the daughter. He has been seeing podiatry. They do have questions about whether or not he can perform physical therapy to have no limitations  on physical therapy with regard to his wounds to be honest. With that being said he questions having some pain in the ankle region which again I am not really the specialist to comment on that therefore I recommended that he probably needs to discuss this with his podiatrist. 02/26/2020 upon evaluation today patient appears to actually be doing extremely well he is completely healed and overall is looking excellent. They did get the compression stockings ordered yesterday so they should be on the way hopefully arriving if not tomorrow than Friday. In the meantime we may just utilize a Ace wrap to try to keep the edema under as much control as possible until he gets his compression stockings. READMISSION 04/03/2021 This is an 84 year old man we had for 2 visits in the clinic in September seen by Jeri Cos. Patient has chronic venous insufficiency with secondary lymphedema. He had wounds on the left anterior leg these healed out. He is also known to Dr. Doren Custard last seen in August 2020. He is known to have an occluded PTA with monophasic waveforms has an 8 occluded tibioperoneal trunk. At the time he was not felt to be a candidate for angiography because of his stage IV chronic renal failure and his wounds were closed. If he was to require angiography he would need a CO2 angiogram. His last arterial studies were in August 2020 as well. He had triphasic waveforms down to the mid popliteal biphasic distally and then monophasic at the dorsalis of the tibioperoneal trunk, ATA occluded PTA monophasic distal PTA his peroneal was not visualized. He wears  compression stockings but did not bring 1 in today He thinks the wound currently we are looking at started about 2 weeks ago. He thinks it is because of abrasion on his foot wear. He has not been wearing his shoe he switched into a left surgical sandal. He has been cleaning this with saline and wrapping it with gauze. ABI in our clinic was 0.52. Please visit previous arterial work-up by Dr. Doren Custard in August 2020 as noted. 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 04/03/2020 9:00 A M Medical Record Number: [id number] Patient Account Number: [id number] Date of Birth/Sex: Treating RN: [birth year] (84 y.o. [name] Primary Care Provider: [name] Other Clinician: Referring Provider: Treating Provider/Extender: Lynden Ang in Treatment: 0 Constitutional Sitting or standing Blood Pressure is within target range for patient.. Pulse regular and within target range for patient.Marland Kitchen Respirations regular, non-labored and within target range.. Temperature is normal and within the target range for the patient.Marland Kitchen Appears in no distress. Cardiovascular Popliteal pulses briskly palpable on the left.. Pedal pulses his dorsalis pedis may be faintly palpable. I could not feel a posterior tibial.. Significant left leg nonpitting edema with hemosiderin staining. Musculoskeletal . Neurological Diabetic insensate neuropathy. Notes Wound exam; he has a small punched out area on the inner phalangeal joint of the left great toe. This is a dorsal wound. Some callus around the circumference and undermining. I remove this with a #3 curette there was minimal to  no bleeding but the wound surface does clean up quite nicely seems to be a dry surface there is no evidence of infection no tenderness over the inner phalangeal joint although he is reasonably insensate Electronic Signature(s) Signed: 04/03/2020  4:52:36 PM By: Linton Ham MD Entered By: Linton Ham on 04/03/2020 10:30:16 -------------------------------------------------------------------------------- Physician Orders Details Patient Name: Date of Service: [name] 04/03/2020 9:00 A M Medical Record Number: [id number] Patient Account Number: [id number] Date of Birth/Sex: Treating RN: Apr 20, 1936 (84 y.o. [name] Primary Care Provider: [name] Other Clinician: Referring Provider: Treating Provider/Extender: Lynden Ang in Treatment: 0 Verbal / Phone Orders: No Diagnosis Coding Follow-up Appointments Return Appointment in 2 weeks. Dressing Change Frequency Wound #1 Left T Great oe Change Dressing every other day. Skin Barriers/Peri-Wound Care Moisturizing lotion - to leg daily Wound Cleansing May shower with protection. May shower and wash wound with soap and water. - on days dressing is changed Primary Wound Dressing Wound #1 Left T Great oe Silver Collagen - moisten with saline Secondary Dressing Wound #1 Left T Great oe Kerlix/Rolled Gauze Dry Gauze Edema Control Patient to wear own compression stockings - left leg daily Elevate legs to the level of the heart or above for 30 minutes daily and/or when sitting, a frequency of: - throughout the day Off-Loading Open toe surgical shoe to: - left foot daily Electronic Signature(s) Signed: 04/03/2020 4:52:36 PM By: Linton Ham MD Signed: 04/03/2020 5:21:31 PM By: Baruch Gouty RN, BSN Entered By: Baruch Gouty on 04/03/2020 10:21:37 -------------------------------------------------------------------------------- Problem List Details Patient Name: Date of  Service: [name] 04/03/2020 9:00 A M Medical Record Number: [id number] Patient Account Number: [id number] Date of Birth/Sex: Treating RN: 1936/03/09 (84 y.o. [name] Primary Care Provider: [name] Other Clinician: Referring Provider: Treating Provider/Extender: Lynden Ang in Treatment: 0 Active Problems ICD-10 Encounter Code Description Active Date MDM Diagnosis E11.621 Type 2 diabetes mellitus with foot ulcer 04/03/2020 No Yes E11.51 Type 2 diabetes mellitus with diabetic peripheral angiopathy without gangrene 04/03/2020 No Yes L97.521 Non-pressure chronic ulcer of other part of left foot limited to breakdown of 04/03/2020 No Yes skin Inactive Problems Resolved Problems Electronic Signature(s) Signed: 04/03/2020 4:52:36 PM By: Linton Ham MD Entered By: Linton Ham on 04/03/2020 10:23:54 -------------------------------------------------------------------------------- Progress Note Details Patient Name: Date of Service: [name] 04/03/2020 9:00 A M Medical Record Number: [id number] Patient Account Number: [id number] Date of Birth/Sex: Treating RN: [birth year] (84 y.o. [name] Primary Care Provider: [name] Other Clinician: Referring Provider: Treating Provider/Extender: Lynden Ang in Treatment: 0 Subjective Chief Complaint Information obtained from Patient Bilateral LE Lymphedema 04/03/2020 patient is here for review of wound over the inter phalangeal joint of the left great toe History of Present Illness (HPI) 02/19/2020 upon evaluation today patient presents today for initial evaluation in clinic concerning issues that has been having with his bilateral lower extremities. He apparently also had a wound on his great toe. With that being said he does have a history of diabetes mellitus type 2, lymphedema, peripheral vascular disease, congestive heart failure,  hypertension, right below-knee amputation, and chronic kidney disease stage IV according to records reviewed with his creatinine clearance being below 30. With that being said the legs have been actually worse than what they are currently he does not seem to have any open wounds at this point significantly he does have a small leaking area on the left leg at this point. With that being said the majority of what I see is a lot of skin changes secondary to the chronic lymphedema to be honest. There does not appear to be any signs of systemic infection or local infection based  on what I see today. The patient is doing much better with regard to the wound on his toe as well according to the daughter. He has been seeing podiatry. They do have questions about whether or not he can perform physical therapy to have no limitations on physical therapy with regard to his wounds to be honest. With that being said he questions having some pain in the ankle region which again I am not really the specialist to comment on that therefore I recommended that he probably needs to discuss this with his podiatrist. 02/26/2020 upon evaluation today patient appears to actually be doing extremely well he is completely healed and overall is looking excellent. They did get the compression stockings ordered yesterday so they should be on the way hopefully arriving if not tomorrow than Friday. In the meantime we may just utilize a Ace wrap to try to keep the edema under as much control as possible until he gets his compression stockings. READMISSION 04/03/2021 This is an 84 year old man we had for 2 visits in the clinic in September seen by Jeri Cos. Patient has chronic venous insufficiency with secondary lymphedema. He had wounds on the left anterior leg these healed out. He is also known to Dr. Doren Custard last seen in August 2020. He is known to have an occluded PTA with monophasic waveforms has an 8 occluded tibioperoneal trunk. At  the time he was not felt to be a candidate for angiography because of his stage IV chronic renal failure and his wounds were closed. If he was to require angiography he would need a CO2 angiogram. His last arterial studies were in August 2020 as well. He had triphasic waveforms down to the mid popliteal biphasic distally and then monophasic at the dorsalis of the tibioperoneal trunk, ATA occluded PTA monophasic distal PTA his peroneal was not visualized. He wears compression stockings but did not bring 1 in today He thinks the wound currently we are looking at started about 2 weeks ago. He thinks it is because of abrasion on his foot wear. He has not been wearing his shoe he switched into a left surgical sandal. He has been cleaning this with saline and wrapping it with gauze. ABI in our clinic was 0.52. Please visit previous arterial work-up by Dr. Doren Custard in August 2020 as noted. 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 Allergies No Known Drug Allergies Family History Cancer - Siblings, Diabetes - Mother,Siblings, Hypertension - Siblings, No family history of Heart Disease, Hereditary Spherocytosis, Kidney Disease, Lung Disease, Seizures, Stroke, Thyroid Problems, Tuberculosis. Social History Never smoker, Marital Status - Married, Alcohol Use - Rarely, Drug Use - No History, Caffeine Use - Never. Medical History Eyes Patient has history of Cataracts - removed Denies history of Glaucoma, Optic Neuritis Ear/Nose/Mouth/Throat Denies history of Chronic sinus problems/congestion, Middle ear problems Hematologic/Lymphatic Patient has history of Anemia, Lymphedema Denies history of Hemophilia, Human Immunodeficiency Virus, Sickle Cell Disease Respiratory Patient has history of Sleep Apnea - CPAP Denies history of Aspiration, Asthma, Chronic Obstructive Pulmonary Disease (COPD), Pneumothorax,  Tuberculosis Cardiovascular Patient has history of Congestive Heart Failure, Hypertension, Peripheral Arterial Disease, Peripheral Venous Disease Denies history of Angina, Arrhythmia, Hypotension, Myocardial Infarction, Phlebitis, Vasculitis Gastrointestinal Denies history of Cirrhosis , Colitis, Crohnoos, Hepatitis A, Hepatitis B, Hepatitis C Endocrine Patient has history of Type II Diabetes Denies history of Type I Diabetes Genitourinary 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 - right BKA 2000. - Anemia 2012. - Fall 10/2019. Medical A Surgical History Notes nd Constitutional Symptoms (General Health) Stroke 2002 Ear/Nose/Mouth/Throat coughing attacks at times related to swallowing issues at times. Review of Systems (ROS) Gastrointestinal Denies complaints or symptoms of Frequent diarrhea, Nausea, Vomiting. Integumentary (Skin) Denies complaints or symptoms of Wounds. Psychiatric Denies complaints or symptoms of Claustrophobia, Suicidal. Objective Constitutional Sitting or standing Blood Pressure is within target range for patient.. Pulse regular and within target range for patient.Marland Kitchen Respirations regular, non-labored and within target range.. Temperature is normal and within the target range for the patient.Marland Kitchen Appears in no distress. Vitals Time Taken: 9:20 AM, Height: 73 in, Source: Stated, Weight: 275 lbs, Source:  Stated, BMI: 36.3, Temperature: 98.1 F, Pulse: 111 bpm, Respiratory Rate: 19 breaths/min, Blood Pressure: 138/73 mmHg, Capillary Blood Glucose: 98 mg/dl. General Notes: patient stated CBG this morning was 98 Cardiovascular Popliteal pulses briskly palpable on the left.. Pedal pulses his dorsalis pedis may be faintly palpable. I could not feel a posterior tibial.. Significant left leg nonpitting edema with hemosiderin staining. Neurological Diabetic insensate neuropathy. General Notes: Wound exam; he has a small punched out area on the inner phalangeal joint of the left great toe. This is a dorsal wound. Some callus around the circumference and undermining. I remove this with a #3 curette there was minimal to no bleeding but the wound surface does clean up quite nicely seems to be a dry surface there is no evidence of infection no tenderness over the inner phalangeal joint although he is reasonably insensate Integumentary (Hair, Skin) Wound #1 status is Open. Original cause of wound was Blister. The wound is located on the Left T Great. The wound measures 0.2cm length x 0.2cm width x oe 0.6cm depth; 0.031cm^2 area and 0.019cm^3 volume. There is Fat Layer (Subcutaneous Tissue) exposed. There is no tunneling noted, however, there is undermining starting at 12:00 and ending at 12:00 with a maximum distance of 0.8cm. There is a medium amount of serosanguineous drainage noted. The wound margin is well defined and not attached to the wound base. There is large (67-100%) pink granulation within the wound bed. There is a small (1-33%) amount of necrotic tissue within the wound bed including Adherent Slough. Assessment Active Problems ICD-10 Type 2 diabetes mellitus with foot ulcer Type 2 diabetes mellitus with diabetic peripheral angiopathy without gangrene Non-pressure chronic ulcer of other part of left foot limited to breakdown of skin Procedures Wound #1 Pre-procedure diagnosis of Wound #1 is  a Diabetic Wound/Ulcer of the Lower Extremity located on the Left T Great .Severity of Tissue Pre Debridement is: oe Fat layer exposed. There was a Excisional Skin/Subcutaneous Tissue Debridement with a total area of 0.42 sq cm performed by Ricard Dillon., MD. With the following instrument(s): Curette to remove Viable and Non-Viable tissue/material. Material removed includes Subcutaneous Tissue, Slough, and Skin: Epidermis. No specimens were taken. A time out was conducted at 10:15, prior to the start of the procedure. A Minimum amount of bleeding was controlled with Pressure. The procedure was tolerated well with a pain level of 0 throughout and a pain  level of 0 following the procedure. Post Debridement Measurements: 0.4cm length x 0.5cm width x 0.1cm depth; 0.016cm^3 volume. Character of Wound/Ulcer Post Debridement is improved. Severity of Tissue Post Debridement is: Fat layer exposed. Post procedure Diagnosis Wound #1: Same as Pre-Procedure Plan Follow-up Appointments: Return Appointment in 2 weeks. Dressing Change Frequency: Wound #1 Left T Great: oe Change Dressing every other day. Skin Barriers/Peri-Wound Care: Moisturizing lotion - to leg daily Wound Cleansing: May shower with protection. May shower and wash wound with soap and water. - on days dressing is changed Primary Wound Dressing: Wound #1 Left T Great: oe Silver Collagen - moisten with saline Secondary Dressing: Wound #1 Left T Great: oe Kerlix/Rolled Gauze Dry Gauze Edema Control: Patient to wear own compression stockings - left leg daily Elevate legs to the level of the heart or above for 30 minutes daily and/or when sitting, a frequency of: - throughout the day Off-Loading: Open toe surgical shoe to: - left foot daily 1. Dry wound surface therefore I elected to use silver collagen change every 2 2. I like the idea of him wearing his surgical shoe and I told him this. Fortunately does not seem to have the  usual balance issues of somebody you as of BKA on the other side and is wearing a running shoe 3. I see no evidence of infection and for now no cultures or x-rays were done 4. The patient does have severe PAD with tibial artery disease. I am hopeful to get this wound to close without involving vein and vascular but if this deteriorates of course that may be necessary. The comment Dr. Doren Custard made was he would need a CO2 angiogram if it came to that. I spent 35 minutes in review of this patient's past medical history face-to-face evaluation and preparation of this record Electronic Signature(s) Signed: 04/03/2020 4:52:36 PM By: Linton Ham MD Entered By: Linton Ham on 04/03/2020 10:31:54 -------------------------------------------------------------------------------- HxROS Details Patient Name: Date of Service: [name] 04/03/2020 9:00 A M Medical Record Number: [id number] Patient Account Number: [id number] Date of Birth/Sex: Treating RN: 01-Jan-1936 (84 y.o. Marvis Repress Primary Care Provider: [name] Other Clinician: Referring Provider: Treating Provider/Extender: Lynden Ang in Treatment: 0 Information Obtained From Patient Gastrointestinal Complaints and Symptoms: Negative for: Frequent diarrhea; Nausea; Vomiting Medical History: Negative for: Cirrhosis ; Colitis; Crohns; Hepatitis A; Hepatitis B; Hepatitis C Integumentary (Skin) Complaints and Symptoms: Negative for: Wounds Medical History: Negative for: History of Burn Psychiatric Complaints and Symptoms: Negative for: Claustrophobia; Suicidal Medical History: Negative for: Anorexia/bulimia; Confinement Anxiety Constitutional Symptoms (General Health) Medical History: Past Medical History Notes: Stroke 2002 Eyes Medical History: Positive for: Cataracts - removed Negative for: Glaucoma; Optic Neuritis Ear/Nose/Mouth/Throat Medical History: Negative for: Chronic  sinus problems/congestion; Middle ear problems Past Medical History Notes: coughing attacks at times related to swallowing issues at times. Hematologic/Lymphatic Medical History: Positive for: Anemia; Lymphedema Negative for: Hemophilia; Human Immunodeficiency Virus; Sickle Cell Disease Respiratory Medical History: Positive for: Sleep Apnea - CPAP Negative for: Aspiration; Asthma; Chronic Obstructive Pulmonary Disease (COPD); Pneumothorax; Tuberculosis Cardiovascular Medical History: Positive for: Congestive Heart Failure; Hypertension; Peripheral Arterial Disease; Peripheral Venous Disease Negative for: Angina; Arrhythmia; Hypotension; Myocardial Infarction; Phlebitis; Vasculitis Endocrine Medical History: Positive for: Type II Diabetes Negative for: Type I Diabetes Time with diabetes: 1980s Treated with: Insulin Blood sugar tested every day: Yes Tested : Genitourinary Medical History: Positive for: End Stage Renal Disease - stage IIII Immunological Medical History: Negative for: Lupus Erythematosus; Raynauds;  Scleroderma Musculoskeletal Medical History: Positive for: Gout Negative for: Rheumatoid Arthritis; Osteoarthritis; Osteomyelitis Neurologic Medical History: Positive for: Neuropathy Negative for: Dementia; Quadriplegia; Paraplegia; Seizure Disorder Oncologic Medical History: Negative for: Received Chemotherapy; Received Radiation HBO Extended History Items Eyes: Cataracts Immunizations Pneumococcal Vaccine: Received Pneumococcal Vaccination: Yes Implantable Devices None Hospitalization / Surgery History Type of Hospitalization/Surgery Stroke 2002 right BKA 2000 Anemia 2012 Fall 10/2019 Family and Social History Cancer: Yes - Siblings; Diabetes: Yes - Mother,Siblings; Heart Disease: No; Hereditary Spherocytosis: No; Hypertension: Yes - Siblings; Kidney Disease: No; Lung Disease: No; Seizures: No; Stroke: No; Thyroid Problems: No; Tuberculosis: No; Never  smoker; Marital Status - Married; Alcohol Use: Rarely; Drug Use: No History; Caffeine Use: Never; Financial Concerns: No; Food, Clothing or Shelter Needs: No; Support System Lacking: No; Transportation Concerns: No Electronic Signature(s) Signed: 04/03/2020 4:52:36 PM By: Linton Ham MD Signed: 04/03/2020 5:00:52 PM By: Kela Millin Entered By: Kela Millin on 04/03/2020 09:23:42 -------------------------------------------------------------------------------- SuperBill Details Patient Name: Date of Service: [name] 04/03/2020 Medical Record Number: [id number] Patient Account Number: [id number] Date of Birth/Sex: Treating RN: 01-03-1936 (84 y.o. [name] Primary Care Provider: [name] Other Clinician: Referring Provider: Treating Provider/Extender: Lynden Ang in Treatment: 0 Diagnosis Coding ICD-10 Codes Code Description [phone] Type 2 diabetes mellitus with foot ulcer E11.51 Type 2 diabetes mellitus with diabetic peripheral angiopathy without gangrene L97.521 Non-pressure chronic ulcer of other part of left foot limited to breakdown of skin Facility Procedures CPT4 Code: [id number] Description: [facility] VISIT-LEV 3 EST PT Modifier: 25 Quantity: 1 CPT4 Code: [id number] Description: 11042 - DEB SUBQ TISSUE 20 SQ CM/< ICD-10 Diagnosis Description E11.621 Type 2 diabetes mellitus with foot ulcer E11.51 Type 2 diabetes mellitus with diabetic peripheral angiopathy without gangrene L97.521 Non-pressure chronic ulcer of other  part of left foot limited to breakdown of sk Modifier: in Quantity: 1 Physician Procedures : CPT4 Code Description Modifier [id number] - WC PHYS LEVEL 4 - EST PT ICD-10 Diagnosis Description E11.621 Type 2 diabetes mellitus with foot ulcer E11.51 Type 2 diabetes mellitus with diabetic peripheral angiopathy without gangrene L97.521  Non-pressure chronic ulcer of other part of left foot  limited to breakdown of skin Quantity: 1 Electronic Signature(s) Signed: 04/03/2020 5:21:31 PM By: Baruch Gouty RN, BSN Signed: 04/06/2020 7:47:50 AM By: Linton Ham MD Previous Signature: 04/03/2020 4:52:36 PM Version By: Linton Ham MD Entered By: Baruch Gouty on 04/03/2020 17:06:47

## 2020-04-15 ENCOUNTER — Ambulatory Visit (INDEPENDENT_AMBULATORY_CARE_PROVIDER_SITE_OTHER): Payer: Medicare Other | Admitting: Podiatry

## 2020-04-15 ENCOUNTER — Other Ambulatory Visit: Payer: Self-pay

## 2020-04-15 ENCOUNTER — Encounter: Payer: Self-pay | Admitting: Podiatry

## 2020-04-15 DIAGNOSIS — E1142 Type 2 diabetes mellitus with diabetic polyneuropathy: Secondary | ICD-10-CM | POA: Diagnosis not present

## 2020-04-15 DIAGNOSIS — Z89511 Acquired absence of right leg below knee: Secondary | ICD-10-CM

## 2020-04-15 DIAGNOSIS — B351 Tinea unguium: Secondary | ICD-10-CM | POA: Diagnosis not present

## 2020-04-17 ENCOUNTER — Other Ambulatory Visit: Payer: Self-pay

## 2020-04-17 ENCOUNTER — Encounter (HOSPITAL_BASED_OUTPATIENT_CLINIC_OR_DEPARTMENT_OTHER): Payer: Medicare Other | Attending: Internal Medicine | Admitting: Internal Medicine

## 2020-04-17 DIAGNOSIS — I890 Lymphedema, not elsewhere classified: Secondary | ICD-10-CM | POA: Diagnosis not present

## 2020-04-17 DIAGNOSIS — L97521 Non-pressure chronic ulcer of other part of left foot limited to breakdown of skin: Secondary | ICD-10-CM | POA: Diagnosis not present

## 2020-04-17 DIAGNOSIS — I872 Venous insufficiency (chronic) (peripheral): Secondary | ICD-10-CM | POA: Insufficient documentation

## 2020-04-17 DIAGNOSIS — E11621 Type 2 diabetes mellitus with foot ulcer: Secondary | ICD-10-CM | POA: Insufficient documentation

## 2020-04-17 DIAGNOSIS — E1151 Type 2 diabetes mellitus with diabetic peripheral angiopathy without gangrene: Secondary | ICD-10-CM | POA: Diagnosis not present

## 2020-04-17 DIAGNOSIS — Z89511 Acquired absence of right leg below knee: Secondary | ICD-10-CM | POA: Insufficient documentation

## 2020-04-17 DIAGNOSIS — L97522 Non-pressure chronic ulcer of other part of left foot with fat layer exposed: Secondary | ICD-10-CM | POA: Diagnosis not present

## 2020-04-18 NOTE — Progress Notes (Signed)
Subjective: Micheal Walls presents today for follow up of at risk foot care. 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   Patient states he is now going to the wound care clinic for the left great toe wound.    No Known Allergies   Objective: There were no vitals filed for this visit.  Pt is a pleasant 84 y.o. year old AA male morbidly obese in NAD. AAO x 3.   Vascular Examination:  Capillary refill time to remaining digits <3 seconds. DP pulse nonpalpable left foot. PT pulse nonpalpable left foot. Pedal hair absent LLE. Skin temperature gradient WNL LLE. Unilateral edema left LE.  Dermatological Examination: Pedal skin warm and supple.  No interdigital macerations left foot. Toenails L 2nd toe, L 3rd toe, L 4th toe and L 5th toe elongated, dystrophic, thickened, and crumbly with subungual debris and tenderness to dorsal palpation. Left 2nd digit with healing abrasion medial border. No erythema, no edema, no drainage, no fluctuance. Dressing noted on left hallux and it is clean, dry and intact.    Musculoskeletal: Normal muscle strength 5/5 to all lower extremity muscle groups of LLE. Hammertoes noted to the L hallux, L 2nd toe, L 3rd toe, L 4th toe and L 5th toe. Utilizes motorized chair for mobility assistance. BKA RLE.  Neurological: Protective sensation diminished with 10 gram monofilament left foot. Vibratory sensation diminished left foot.  Assessment: 1. Onychomycosis   2. Diabetic peripheral neuropathy associated with type 2 diabetes mellitus ([place])   3. Status post below knee amputation, right ([place])     Plan: -Continue diabetic foot care principles. -Toenails L 2nd toe, L 3rd toe, L 4th toe and L 5th toe debrided in length and girth without iatrogenic bleeding with sterile nail nipper and dremel.  -Patient currently seeing Wound Care for  left hallux ulceration.  -Patient to  continue Darco shoe per Dr. Sherryle Lis. -Patient to report any pedal injuries to medical professional immediately. -Patient/POA to call should there be question/concern in the interim.  Return in about 3 months (around 07/16/2020).  [name], DPM

## 2020-04-20 NOTE — Progress Notes (Signed)
Micheal Walls, Micheal Walls ([id number]) Visit Report for 04/17/2020 Arrival Information Details Patient Name: Date of Service: Micheal Walls, Micheal Walls 04/17/2020 9:30 A M Medical Record Number: [id number] Patient Account Number: [id number] Date of Birth/Sex: Treating RN: [birth year] (84 y.o. [name] Primary Care Debbie Bellucci: [name] Other Clinician: Referring Kupono Marling: Treating Ephram Kornegay/Extender: Lynden Ang in Treatment: 2 Visit Information History Since Last Visit Added or deleted any medications: No Patient Arrived: Wheel Chair Any new allergies or adverse reactions: No Arrival Time: 09:41 Had a fall or experienced change in No Accompanied By: self activities of daily living that may affect Transfer Assistance: None risk of falls: Patient Identification Verified: Yes Signs or symptoms of abuse/neglect since last visito No Secondary Verification Process Completed: Yes Hospitalized since last visit: No Implantable device outside of the clinic excluding No cellular tissue based products placed in the center since last visit: Has Dressing in Place as Prescribed: Yes Pain Present Now: No Electronic Signature(s) Signed: 04/17/2020 2:44:54 PM By: Sandre Kitty Entered By: Sandre Kitty on 04/17/2020 09:43:29 -------------------------------------------------------------------------------- Encounter Discharge Information Details Patient Name: Date of Service: Micheal Quails D. 04/17/2020 9:30 A M Medical Record Number: [id number] Patient Account Number: [id number] Date of Birth/Sex: Treating RN: 09-25-1935 (84 y.o. [name] Primary Care Dontavius Keim: [name] Other Clinician: Referring Kristion Holifield: Treating Gennie Eisinger/Extender: Lynden Ang in Treatment: 2 Encounter Discharge Information Items Post Procedure Vitals Discharge Condition: Stable Temperature (F): 97.7 Ambulatory Status: Wheelchair Pulse (bpm):  91 Discharge Destination: Home Respiratory Rate (breaths/min): 19 Transportation: Private Auto Blood Pressure (mmHg): 120/66 Accompanied By: alone Schedule Follow-up Appointment: Yes Clinical Summary of Care: Patient Declined Electronic Signature(s) Signed: 04/20/2020 6:16:40 PM By: Levan Hurst RN, BSN Entered By: Levan Hurst on 04/17/2020 14:38:34 -------------------------------------------------------------------------------- Lower Extremity Assessment Details Patient Name: Date of Service: Micheal Quails D. 04/17/2020 9:30 A M Medical Record Number: [id number] Patient Account Number: [id number] Date of Birth/Sex: Treating RN: [birth year] (84 y.o. Hessie Diener Primary Care Keyonta Madrid: [name] Other Clinician: Referring Meshach Perry: Treating Raya Mckinstry/Extender: Lynden Ang in Treatment: 2 Edema Assessment Assessed: Shirlyn Goltz: Yes] Patrice Paradise: No] Edema: [Left: Ye] [Right: s] Calf Left: Right: Point of Measurement: 40 cm From Medial Instep 44.5 cm Ankle Left: Right: Point of Measurement: 13 cm From Medial Instep 33.5 cm Vascular Assessment Pulses: Dorsalis Pedis Palpable: [Left:Yes] Electronic Signature(s) Signed: 04/17/2020 5:41:13 PM By: Deon Pilling Entered By: Deon Pilling on 04/17/2020 09:46:45 -------------------------------------------------------------------------------- Multi Wound Chart Details Patient Name: Date of Service: Micheal Quails D. 04/17/2020 9:30 A M Medical Record Number: [id number] Patient Account Number: [id number] Date of Birth/Sex: Treating RN: Sep 27, 1935 (84 y.o. [name] Primary Care Kyanna Mahrt: [name] Other Clinician: Referring Destine Zirkle: Treating Aldric Wenzler/Extender: Lynden Ang in Treatment: 2 Vital Signs Height(in): 34 Pulse(bpm): 45 Weight(lbs): 275 Blood Pressure(mmHg): 120/66 Body Mass Index(BMI): 36 Temperature(F): 97.7 Respiratory Rate(breaths/min):  19 Photos: [1:No Photos Left T Great oe] [N/A:N/A N/A] Wound Location: [1:Blister] [N/A:N/A] Wounding Event: [1:Diabetic Wound/Ulcer of the Lower] [N/A:N/A] Primary Etiology: [1:Extremity Cataracts, Anemia, Lymphedema,] [N/A:N/A] Comorbid History: [1:Sleep Apnea, Congestive Heart Failure, Hypertension, Peripheral Arterial Disease, Peripheral Venous Disease, Type II Diabetes, End Stage Renal Disease, Gout, Neuropathy 03/27/2020] [N/A:N/A] Date Acquired: [1:2] [N/A:N/A] Weeks of Treatment: [1:Open] [N/A:N/A] Wound Status: [1:0.5x0.8x0.3] [N/A:N/A] Measurements L x W x D (cm) [1:0.314] [N/A:N/A] A (cm) : rea [1:0.094] [N/A:N/A] Volume (cm) : [1:-912.90%] [N/A:N/A] % Reduction in A [1:rea: -394.70%] [N/A:N/A] % Reduction in Volume: [1:Grade 2] [N/A:N/A] Classification: [1:Medium] [N/A:N/A] Exudate  A mount: [1:Serosanguineous] [N/A:N/A] Exudate Type: [1:red, brown] [N/A:N/A] Exudate Color: [1:Well defined, not attached] [N/A:N/A] Wound Margin: [1:Large (67-100%)] [N/A:N/A] Granulation A mount: [1:Pink] [N/A:N/A] Granulation Quality: [1:Small (1-33%)] [N/A:N/A] Necrotic A mount: [1:Fat Layer (Subcutaneous Tissue): Yes N/A] Exposed Structures: [1:Fascia: No Tendon: No Muscle: No Joint: No Bone: No Small (1-33%)] [N/A:N/A] Epithelialization: [1:Debridement - Excisional] [N/A:N/A] Debridement: Pre-procedure Verification/Time Out 10:33 [N/A:N/A] Taken: [1:Other] [N/A:N/A] Pain Control: [1:Subcutaneous, Slough] [N/A:N/A] Tissue Debrided: [1:Skin/Subcutaneous Tissue] [N/A:N/A] Level: [1:0.48] [N/A:N/A] Debridement A (sq cm): [1:rea Curette] [N/A:N/A] Instrument: [1:Minimum] [N/A:N/A] Bleeding: [1:Pressure] [N/A:N/A] Hemostasis A chieved: [1:0] [N/A:N/A] Procedural Pain: [1:0] [N/A:N/A] Post Procedural Pain: [1:Procedure was tolerated well] [N/A:N/A] Debridement Treatment Response: [1:0.6x0.8x0.3] [N/A:N/A] Post Debridement Measurements L x W x D (cm) [1:0.113] [N/A:N/A] Post  Debridement Volume: (cm) [1:Debridement] [N/A:N/A] Treatment Notes Electronic Signature(s) Signed: 04/17/2020 6:06:29 PM By: Baruch Gouty RN, BSN Signed: 04/20/2020 1:46:42 PM By: Linton Ham MD Entered By: Linton Ham on 04/17/2020 10:38:19 -------------------------------------------------------------------------------- Multi-Disciplinary Care Plan Details Patient Name: Date of Service: Micheal Quails D. 04/17/2020 9:30 A M Medical Record Number: [id number] Patient Account Number: [id number] Date of Birth/Sex: Treating RN: 11-16-1935 (84 y.o. [name] Primary Care Haze Antillon: [name] Other Clinician: Referring Myiah Petkus: Treating Malloree Raboin/Extender: Lynden Ang in Treatment: 2 Active Inactive Nutrition Nursing Diagnoses: Impaired glucose control: actual or potential Potential for alteratiion in Nutrition/Potential for imbalanced nutrition Goals: Patient/caregiver will maintain therapeutic glucose control Date Initiated: 04/03/2020 Target Resolution Date: 05/01/2020 Goal Status: Active Interventions: Assess HgA1c results as ordered upon admission and as needed Provide education on elevated blood sugars and impact on wound healing Treatment Activities: Patient referred to Primary Care Physician for further nutritional evaluation : 04/03/2020 Notes: Wound/Skin Impairment Nursing Diagnoses: Impaired tissue integrity Knowledge deficit related to ulceration/compromised skin integrity Goals: Patient/caregiver will verbalize understanding of skin care regimen Date Initiated: 04/03/2020 Target Resolution Date: 05/01/2020 Goal Status: Active Ulcer/skin breakdown will have a volume reduction of 50% by week 8 Date Initiated: 04/03/2020 Target Resolution Date: 05/01/2020 Goal Status: Active Interventions: Assess patient/caregiver ability to obtain necessary supplies Assess patient/caregiver ability to perform ulcer/skin care  regimen upon admission and as needed Assess ulceration(s) every visit Provide education on ulcer and skin care Treatment Activities: Skin care regimen initiated : 04/03/2020 Topical wound management initiated : 04/03/2020 Notes: Electronic Signature(s) Signed: 04/17/2020 6:06:29 PM By: Baruch Gouty RN, BSN Entered By: Baruch Gouty on 04/17/2020 09:39:01 -------------------------------------------------------------------------------- Pain Assessment Details Patient Name: Date of Service: Micheal Quails D. 04/17/2020 9:30 A M Medical Record Number: [id number] Patient Account Number: [id number] Date of Birth/Sex: Treating RN: [birth year] (84 y.o. [name] Primary Care Reganne Messerschmidt: [name] Other Clinician: Referring Markeya Mincy: Treating Eldine Rencher/Extender: Lynden Ang in Treatment: 2 Active Problems Location of Pain Severity and Description of Pain Patient Has Paino No Site Locations Pain Management and Medication Current Pain Management: Electronic Signature(s) Signed: 04/17/2020 2:44:54 PM By: Sandre Kitty Signed: 04/17/2020 6:06:29 PM By: Baruch Gouty RN, BSN Entered By: Sandre Kitty on 04/17/2020 09:44:02 -------------------------------------------------------------------------------- Patient/Caregiver Education Details Patient Name: Date of Service: Micheal Walls 11/5/2021andnbsp9:30 A M Medical Record Number: [id number] Patient Account Number: [id number] Date of Birth/Gender: Treating RN: 07/29/1935 (84 y.o. [name] Primary Care Physician: [name] Other Clinician: Referring Physician: Treating Physician/Extender: Lynden Ang in Treatment: 2 Education Assessment Education Provided To: Patient Education Topics Provided Elevated Blood Sugar/ Impact on Healing: Methods: Explain/Verbal Responses: Reinforcements needed, State content correctly Wound/Skin  Impairment: Methods: Explain/Verbal Responses: Reinforcements  needed, State content correctly Electronic Signature(s) Signed: 04/17/2020 6:06:29 PM By: Baruch Gouty RN, BSN Entered By: Baruch Gouty on 04/17/2020 09:39:32 -------------------------------------------------------------------------------- Wound Assessment Details Patient Name: Date of Service: Micheal Quails D. 04/17/2020 9:30 A M Medical Record Number: [id number] Patient Account Number: [id number] Date of Birth/Sex: Treating RN: 1935/07/26 (84 y.o. Hessie Diener Primary Care Fredricka Kohrs: [name] Other Clinician: Referring Tarik Teixeira: Treating Shihab States/Extender: Lynden Ang in Treatment: 2 Wound Status Wound Number: 1 Primary Diabetic Wound/Ulcer of the Lower Extremity Etiology: Wound Location: Left T Great oe Wound Open Wounding Event: Blister Status: Date Acquired: 03/27/2020 Comorbid Cataracts, Anemia, Lymphedema, Sleep Apnea, Congestive Heart Weeks Of Treatment: 2 History: Failure, Hypertension, Peripheral Arterial Disease, Peripheral Venous Clustered Wound: No Disease, Type II Diabetes, End Stage Renal Disease, Gout, Neuropathy Wound Measurements Length: (cm) 0.5 Width: (cm) 0.8 Depth: (cm) 0.3 Area: (cm) 0.314 Volume: (cm) 0.094 % Reduction in Area: -912.9% % Reduction in Volume: -394.7% Epithelialization: Small (1-33%) Tunneling: No Wound Description Classification: Grade 2 Wound Margin: Well defined, not attached Exudate Amount: Medium Exudate Type: Serosanguineous Exudate Color: red, brown Foul Odor After Cleansing: No Slough/Fibrino Yes Wound Bed Granulation Amount: Large (67-100%) Exposed Structure Granulation Quality: Pink Fascia Exposed: No Necrotic Amount: Small (1-33%) Fat Layer (Subcutaneous Tissue) Exposed: Yes Necrotic Quality: Adherent Slough Tendon Exposed: No Muscle Exposed: No Joint Exposed: No Bone Exposed: No Treatment Notes Wound  #1 (Left Toe Great) 1. Cleanse With Wound Cleanser 3. Primary Dressing Applied [employer] 4. Secondary Dressing Dry Gauze Roll Gauze 5. Secured With Recruitment consultant) Signed: 04/17/2020 5:41:13 PM By: Deon Pilling Entered By: Deon Pilling on 04/17/2020 09:47:13 -------------------------------------------------------------------------------- Vitals Details Patient Name: Date of Service: Micheal Quails D. 04/17/2020 9:30 A M Medical Record Number: [id number] Patient Account Number: [id number] Date of Birth/Sex: Treating RN: 07/20/1935 (84 y.o. [name] Primary Care Florentine Diekman: [name] Other Clinician: Referring Arlee Bossard: Treating Elanor Cale/Extender: Lynden Ang in Treatment: 2 Vital Signs Time Taken: 09:43 Temperature (F): 97.7 Height (in): 73 Pulse (bpm): 91 Weight (lbs): 275 Respiratory Rate (breaths/min): 19 Body Mass Index (BMI): 36.3 Blood Pressure (mmHg): 120/66 Reference Range: 80 - 120 mg / dl Electronic Signature(s) Signed: 04/17/2020 2:44:54 PM By: Sandre Kitty Entered By: Sandre Kitty on 04/17/2020 09:43:56

## 2020-04-20 NOTE — Progress Notes (Signed)
Micheal Walls, Micheal Walls ([id number]) Visit Report for 04/17/2020 Debridement Details Patient Name: Date of Service: Micheal Walls, Micheal Walls 04/17/2020 9:30 A M Medical Record Number: [id number] Patient Account Number: [id number] Date of Birth/Sex: Treating RN: May 05, 1936 (84 y.o. Micheal Walls Primary Care Provider: [name] Other Clinician: Referring Provider: Treating Provider/Extender: Lynden Ang in Treatment: 2 Debridement Performed for Assessment: Wound #1 Left T Great oe Performed By: Physician Ricard Dillon., MD Debridement Type: Debridement Severity of Tissue Pre Debridement: Fat layer exposed Level of Consciousness (Pre-procedure): Awake and Alert Pre-procedure Verification/Time Out Yes - 10:33 Taken: Start Time: 10:33 Pain Control: Other : benzocaine 20% spray T Area Debrided (L x W): otal 0.6 (cm) x 0.8 (cm) = 0.48 (cm) Tissue and other material debrided: Viable, Non-Viable, Slough, Subcutaneous, Slough Level: Skin/Subcutaneous Tissue Debridement Description: Excisional Instrument: Curette Bleeding: Minimum Hemostasis Achieved: Pressure Procedural Pain: 0 Post Procedural Pain: 0 Response to Treatment: Procedure was tolerated well Level of Consciousness (Post- Awake and Alert procedure): Post Debridement Measurements of Total Wound Length: (cm) 0.6 Width: (cm) 0.8 Depth: (cm) 0.3 Volume: (cm) 0.113 Character of Wound/Ulcer Post Debridement: Improved Severity of Tissue Post Debridement: Fat layer exposed Post Procedure Diagnosis Same as Pre-procedure Electronic Signature(s) Signed: 04/17/2020 6:06:29 PM By: Baruch Gouty RN, BSN Signed: 04/20/2020 1:46:42 PM By: Linton Ham MD Entered By: Linton Ham on 04/17/2020 10:39:47 -------------------------------------------------------------------------------- HPI Details Patient Name: Date of Service: Micheal Walls D. 04/17/2020 9:30 A M Medical Record Number:  [id number] Patient Account Number: [id number] Date of Birth/Sex: Treating RN: 1935-10-13 (84 y.o. Micheal Walls Primary Care Provider: [name] Other Clinician: Referring Provider: Treating Provider/Extender: Lynden Ang in Treatment: 2 History of Present Illness HPI Description: 02/19/2020 upon evaluation today patient presents today for initial evaluation in clinic concerning issues that has been having with his bilateral lower extremities. He apparently also had a wound on his great toe. With that being said he does have a history of diabetes mellitus type 2, lymphedema, peripheral vascular disease, congestive heart failure, hypertension, right below-knee amputation, and chronic kidney disease stage IV according to records reviewed with his creatinine clearance being below 30. With that being said the legs have been actually worse than what they are currently he does not seem to have any open wounds at this point significantly he does have a small leaking area on the left leg at this point. With that being said the majority of what I see is a lot of skin changes secondary to the chronic lymphedema to be honest. There does not appear to be any signs of systemic infection or local infection based on what I see today. The patient is doing much better with regard to the wound on his toe as well according to the daughter. He has been seeing podiatry. They do have questions about whether or not he can perform physical therapy to have no limitations on physical therapy with regard to his wounds to be honest. With that being said he questions having some pain in the ankle region which again I am not really the specialist to comment on that therefore I recommended that he probably needs to discuss this with his podiatrist. 02/26/2020 upon evaluation today patient appears to actually be doing extremely well he is completely healed and overall is looking excellent. They did  get the compression stockings ordered yesterday so they should be on the way hopefully arriving if not tomorrow than Friday. In the meantime we may just  utilize a Ace wrap to try to keep the edema under as much control as possible until he gets his compression stockings. READMISSION 04/03/2021 This is an 84 year old man we had for 2 visits in the clinic in September seen by Jeri Cos. Patient has chronic venous insufficiency with secondary lymphedema. He had wounds on the left anterior leg these healed out. He is also known to Dr. Doren Custard last seen in August 2020. He is known to have an occluded PTA with monophasic waveforms has an 8 occluded tibioperoneal trunk. At the time he was not felt to be a candidate for angiography because of his stage IV chronic renal failure and his wounds were closed. If he was to require angiography he would need a CO2 angiogram. His last arterial studies were in August 2020 as well. He had triphasic waveforms down to the mid popliteal biphasic distally and then monophasic at the dorsalis of the tibioperoneal trunk, ATA occluded PTA monophasic distal PTA his peroneal was not visualized. He wears compression stockings but did not bring 1 in today He thinks the wound currently we are looking at started about 2 weeks ago. He thinks it is because of abrasion on his foot wear. He has not been wearing his shoe he switched into a left surgical sandal. He has been cleaning this with saline and wrapping it with gauze. ABI in our clinic was 0.52. Please visit previous arterial work-up by Dr. Doren Custard in August 2020 as noted. 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 We use silver collagen. He has known PAD and very significant lymphedema. I have not ordered arterial studies or vascular consult unless this area  worsens Electronic Signature(s) Signed: 04/20/2020 1:46:42 PM By: Linton Ham MD Entered By: Linton Ham on 04/17/2020 10:41:05 -------------------------------------------------------------------------------- Physical Exam Details Patient Name: Date of Service: Micheal Walls D. 04/17/2020 9:30 A M Medical Record Number: [id number] Patient Account Number: [id number] Date of Birth/Sex: Treating RN: 1935/11/18 (84 y.o. Micheal Walls Primary Care Provider: [name] Other Clinician: Referring Provider: Treating Provider/Extender: Lynden Ang in Treatment: 2 Notes Wound exam; small punched-out area on the interphalangeal joint of the left great toe dorsally. This has a probing sinus distally. I could see this when I first went in the room I removed callus and overhanging skin and subcutaneous tissue to fully expose the wound. There is a deeper area here proximally although it does not go to bone. Bleeding was actually quite brisk which in this case is gratifying to see Electronic Signature(s) Signed: 04/20/2020 1:46:42 PM By: Linton Ham MD Entered By: Linton Ham on 04/17/2020 10:42:15 -------------------------------------------------------------------------------- Physician Orders Details Patient Name: Date of Service: Micheal Walls D. 04/17/2020 9:30 A M Medical Record Number: [id number] Patient Account Number: [id number] Date of Birth/Sex: Treating RN: 1935/09/11 (84 y.o. Micheal Walls Primary Care Provider: [name] Other Clinician: Referring Provider: Treating Provider/Extender: Lynden Ang in Treatment: 2 Verbal / Phone Orders: No Diagnosis Coding ICD-10 Coding Code Description E11.621 Type 2 diabetes mellitus with foot ulcer E11.51 Type 2 diabetes mellitus with diabetic peripheral angiopathy without gangrene L97.521 Non-pressure chronic ulcer of other part of left foot limited to breakdown  of skin Follow-up Appointments Return Appointment in 1 week. Dressing Change Frequency Wound #1 Left T Great oe Change Dressing every other day.  Skin Barriers/Peri-Wound Care Moisturizing lotion - to leg daily Wound Cleansing May shower with protection. May shower and wash wound with soap and water. - on days dressing is changed Primary Wound Dressing Wound #1 Left T Great oe Silver Collagen - moisten with saline Secondary Dressing Wound #1 Left T Great oe Kerlix/Rolled Gauze Dry Gauze Edema Control Patient to wear own compression stockings - left leg daily Elevate legs to the level of the heart or above for 30 minutes daily and/or when sitting, a frequency of: - throughout the day Exercise regularly Support Garment 20-30 mm/Hg pressure to: - compression stocking to left leg daily Off-Loading Open toe surgical shoe to: - left foot daily Electronic Signature(s) Signed: 04/17/2020 6:06:29 PM By: Baruch Gouty RN, BSN Signed: 04/20/2020 1:46:42 PM By: Linton Ham MD Entered By: Baruch Gouty on 04/17/2020 10:37:23 -------------------------------------------------------------------------------- Problem List Details Patient Name: Date of Service: Micheal Walls D. 04/17/2020 9:30 A M Medical Record Number: [id number] Patient Account Number: [id number] Date of Birth/Sex: Treating RN: 1935-10-31 (84 y.o. Micheal Walls Primary Care Provider: [name] Other Clinician: Referring Provider: Treating Provider/Extender: Lynden Ang in Treatment: 2 Active Problems ICD-10 Encounter Code Description Active Date MDM Diagnosis E11.621 Type 2 diabetes mellitus with foot ulcer 04/03/2020 No Yes E11.51 Type 2 diabetes mellitus with diabetic peripheral angiopathy without gangrene 04/03/2020 No Yes L97.521 Non-pressure chronic ulcer of other part of left foot limited to breakdown of 04/03/2020 No Yes skin Inactive Problems Resolved  Problems Electronic Signature(s) Signed: 04/20/2020 1:46:42 PM By: Linton Ham MD Entered By: Linton Ham on 04/17/2020 10:38:14 -------------------------------------------------------------------------------- Progress Note Details Patient Name: Date of Service: Micheal Walls D. 04/17/2020 9:30 A M Medical Record Number: [id number] Patient Account Number: [id number] Date of Birth/Sex: Treating RN: Dec 12, 1935 (84 y.o. Micheal Walls Primary Care Provider: [name] Other Clinician: Referring Provider: Treating Provider/Extender: Lynden Ang in Treatment: 2 Subjective History of Present Illness (HPI) 02/19/2020 upon evaluation today patient presents today for initial evaluation in clinic concerning issues that has been having with his bilateral lower extremities. He apparently also had a wound on his great toe. With that being said he does have a history of diabetes mellitus type 2, lymphedema, peripheral vascular disease, congestive heart failure, hypertension, right below-knee amputation, and chronic kidney disease stage IV according to records reviewed with his creatinine clearance being below 30. With that being said the legs have been actually worse than what they are currently he does not seem to have any open wounds at this point significantly he does have a small leaking area on the left leg at this point. With that being said the majority of what I see is a lot of skin changes secondary to the chronic lymphedema to be honest. There does not appear to be any signs of systemic infection or local infection based on what I see today. The patient is doing much better with regard to the wound on his toe as well according to the daughter. He has been seeing podiatry. They do have questions about whether or not he can perform physical therapy to have no limitations on physical therapy with regard to his wounds to be honest. With that being said he  questions having some pain in the ankle region which again I am not really the specialist to comment on that therefore I recommended that he probably needs to discuss this with his podiatrist. 02/26/2020 upon evaluation today patient appears to actually be doing extremely well  he is completely healed and overall is looking excellent. They did get the compression stockings ordered yesterday so they should be on the way hopefully arriving if not tomorrow than Friday. In the meantime we may just utilize a Ace wrap to try to keep the edema under as much control as possible until he gets his compression stockings. READMISSION 04/03/2021 This is an 84 year old man we had for 2 visits in the clinic in September seen by Jeri Cos. Patient has chronic venous insufficiency with secondary lymphedema. He had wounds on the left anterior leg these healed out. He is also known to Dr. Doren Custard last seen in August 2020. He is known to have an occluded PTA with monophasic waveforms has an 8 occluded tibioperoneal trunk. At the time he was not felt to be a candidate for angiography because of his stage IV chronic renal failure and his wounds were closed. If he was to require angiography he would need a CO2 angiogram. His last arterial studies were in August 2020 as well. He had triphasic waveforms down to the mid popliteal biphasic distally and then monophasic at the dorsalis of the tibioperoneal trunk, ATA occluded PTA monophasic distal PTA his peroneal was not visualized. He wears compression stockings but did not bring 1 in today He thinks the wound currently we are looking at started about 2 weeks ago. He thinks it is because of abrasion on his foot wear. He has not been wearing his shoe he switched into a left surgical sandal. He has been cleaning this with saline and wrapping it with gauze. ABI in our clinic was 0.52. Please visit previous arterial work-up by Dr. Doren Custard in August 2020 as noted. He was felt to have  tibial vessel disease with numerous collaterals throughout the calf 11/5; patient we readmitted the clinic last week he has an area over the inner phalangeal joint of his left great toe dorsally. We use silver collagen. He has known PAD and very significant lymphedema. I have not ordered arterial studies or vascular consult unless this area worsens Objective Constitutional Vitals Time Taken: 9:43 AM, Height: 73 in, Weight: 275 lbs, BMI: 36.3, Temperature: 97.7 F, Pulse: 91 bpm, Respiratory Rate: 19 breaths/min, Blood Pressure: 120/66 mmHg. Integumentary (Hair, Skin) Wound #1 status is Open. Original cause of wound was Blister. The wound is located on the Left T Great. The wound measures 0.5cm length x 0.8cm width x oe 0.3cm depth; 0.314cm^2 area and 0.094cm^3 volume. There is Fat Layer (Subcutaneous Tissue) exposed. There is no tunneling noted. There is a medium amount of serosanguineous drainage noted. The wound margin is well defined and not attached to the wound base. There is large (67-100%) pink granulation within the wound bed. There is a small (1-33%) amount of necrotic tissue within the wound bed including Adherent Slough. Assessment Active Problems ICD-10 Type 2 diabetes mellitus with foot ulcer Type 2 diabetes mellitus with diabetic peripheral angiopathy without gangrene Non-pressure chronic ulcer of other part of left foot limited to breakdown of skin Procedures Wound #1 Pre-procedure diagnosis of Wound #1 is a Diabetic Wound/Ulcer of the Lower Extremity located on the Left T Great .Severity of Tissue Pre Debridement is: oe Fat layer exposed. There was a Excisional Skin/Subcutaneous Tissue Debridement with a total area of 0.48 sq cm performed by Ricard Dillon., MD. With the following instrument(s): Curette to remove Viable and Non-Viable tissue/material. Material removed includes Subcutaneous Tissue and Slough and after achieving pain control using Other (benzocaine 20%  spray). No specimens  were taken. A time out was conducted at 10:33, prior to the start of the procedure. A Minimum amount of bleeding was controlled with Pressure. The procedure was tolerated well with a pain level of 0 throughout and a pain level of 0 following the procedure. Post Debridement Measurements: 0.6cm length x 0.8cm width x 0.3cm depth; 0.113cm^3 volume. Character of Wound/Ulcer Post Debridement is improved. Severity of Tissue Post Debridement is: Fat layer exposed. Post procedure Diagnosis Wound #1: Same as Pre-Procedure Plan Follow-up Appointments: Return Appointment in 1 week. Dressing Change Frequency: Wound #1 Left T Great: oe Change Dressing every other day. Skin Barriers/Peri-Wound Care: Moisturizing lotion - to leg daily Wound Cleansing: May shower with protection. May shower and wash wound with soap and water. - on days dressing is changed Primary Wound Dressing: Wound #1 Left T Great: oe Silver Collagen - moisten with saline Secondary Dressing: Wound #1 Left T Great: oe Kerlix/Rolled Gauze Dry Gauze Edema Control: Patient to wear own compression stockings - left leg daily Elevate legs to the level of the heart or above for 30 minutes daily and/or when sitting, a frequency of: - throughout the day Exercise regularly Support Garment 20-30 mm/Hg pressure to: - compression stocking to left leg daily Off-Loading: Open toe surgical shoe to: - left foot daily 1. I continued with the silver collagen post debridement 2. I keep hoping that we will not have to involve vascular surgery in this. With the debridement he actually bled quite briskly Electronic Signature(s) Signed: 04/20/2020 1:46:42 PM By: Linton Ham MD Entered By: Linton Ham on 04/17/2020 10:42:46 -------------------------------------------------------------------------------- SuperBill Details Patient Name: Date of Service: Micheal Walls D. 04/17/2020 Medical Record Number:  [id number] Patient Account Number: [id number] Date of Birth/Sex: Treating RN: 01/22/1936 (84 y.o. Ulyses Amor, Vaughan Basta Primary Care Provider: [name] Other Clinician: Referring Provider: Treating Provider/Extender: Lynden Ang in Treatment: 2 Diagnosis Coding ICD-10 Codes Code Description [id number] Type 2 diabetes mellitus with foot ulcer E11.51 Type 2 diabetes mellitus with diabetic peripheral angiopathy without gangrene L97.521 Non-pressure chronic ulcer of other part of left foot limited to breakdown of skin Facility Procedures CPT4 Code: [id number] Description: 15868 - DEB SUBQ TISSUE 20 SQ CM/< ICD-10 Diagnosis Description L97.521 Non-pressure chronic ulcer of other part of left foot limited to breakdown of Modifier: skin Quantity: 1 Physician Procedures : CPT4 Code Description Modifier [id number] - WC PHYS SUBQ TISS 20 SQ CM ICD-10 Diagnosis Description L97.521 Non-pressure chronic ulcer of other part of left foot limited to breakdown of skin Quantity: 1 Electronic Signature(s) Signed: 04/20/2020 1:46:42 PM By: Linton Ham MD Entered By: Linton Ham on 04/17/2020 10:42:57

## 2020-04-21 ENCOUNTER — Other Ambulatory Visit: Payer: Self-pay

## 2020-04-21 DIAGNOSIS — G629 Polyneuropathy, unspecified: Secondary | ICD-10-CM

## 2020-04-21 DIAGNOSIS — I739 Peripheral vascular disease, unspecified: Secondary | ICD-10-CM

## 2020-04-21 MED ORDER — GABAPENTIN 100 MG PO CAPS: 100.0000 mg | ORAL_CAPSULE | Freq: Three times a day (TID) | ORAL | 0 refills | Status: DC

## 2020-04-22 ENCOUNTER — Encounter (HOSPITAL_BASED_OUTPATIENT_CLINIC_OR_DEPARTMENT_OTHER): Payer: Medicare Other | Admitting: Physician Assistant

## 2020-04-24 ENCOUNTER — Other Ambulatory Visit: Payer: Self-pay | Admitting: Podiatry

## 2020-04-24 ENCOUNTER — Encounter (HOSPITAL_BASED_OUTPATIENT_CLINIC_OR_DEPARTMENT_OTHER): Payer: Medicare Other | Admitting: Internal Medicine

## 2020-04-24 ENCOUNTER — Other Ambulatory Visit: Payer: Self-pay

## 2020-04-24 DIAGNOSIS — E1151 Type 2 diabetes mellitus with diabetic peripheral angiopathy without gangrene: Secondary | ICD-10-CM | POA: Diagnosis not present

## 2020-04-24 DIAGNOSIS — B353 Tinea pedis: Secondary | ICD-10-CM

## 2020-04-24 DIAGNOSIS — E11621 Type 2 diabetes mellitus with foot ulcer: Secondary | ICD-10-CM | POA: Diagnosis not present

## 2020-04-24 DIAGNOSIS — I872 Venous insufficiency (chronic) (peripheral): Secondary | ICD-10-CM | POA: Diagnosis not present

## 2020-04-24 DIAGNOSIS — L97521 Non-pressure chronic ulcer of other part of left foot limited to breakdown of skin: Secondary | ICD-10-CM | POA: Diagnosis not present

## 2020-04-24 DIAGNOSIS — I890 Lymphedema, not elsewhere classified: Secondary | ICD-10-CM | POA: Diagnosis not present

## 2020-04-24 DIAGNOSIS — Z89511 Acquired absence of right leg below knee: Secondary | ICD-10-CM | POA: Diagnosis not present

## 2020-04-24 NOTE — Telephone Encounter (Signed)
Please advise 

## 2020-04-27 NOTE — Progress Notes (Signed)
Micheal Walls, Micheal Walls ([id number]) Visit Report for 04/24/2020 HPI Details Patient Name: Date of Service: Micheal Walls, Micheal Walls 04/24/2020 9:00 A M Medical Record Number: [id number] Patient Account Number: [id number] Date of Birth/Sex: Treating RN: 05/26/1936 (84 y.o. [name] Primary Care Provider: [name] Other Clinician: Referring Provider: Treating Provider/Extender: Lynden Ang in Treatment: 3 History of Present Illness HPI Description: 02/19/2020 upon evaluation today patient presents today for initial evaluation in clinic concerning issues that has been having with his bilateral lower extremities. He apparently also had a wound on his great toe. With that being said he does have a history of diabetes mellitus type 2, lymphedema, peripheral vascular disease, congestive heart failure, hypertension, right below-knee amputation, and chronic kidney disease stage IV according to records reviewed with his creatinine clearance being below 30. With that being said the legs have been actually worse than what they are currently he does not seem to have any open wounds at this point significantly he does have a small leaking area on the left leg at this point. With that being said the majority of what I see is a lot of skin changes secondary to the chronic lymphedema to be honest. There does not appear to be any signs of systemic infection or local infection based on what I see today. The patient is doing much better with regard to the wound on his toe as well according to the daughter. He has been seeing podiatry. They do have questions about whether or not he can perform physical therapy to have no limitations on physical therapy with regard to his wounds to be honest. With that being said he questions having some pain in the ankle region which again I am not really the specialist to comment on that therefore I recommended that he probably needs to discuss this  with his podiatrist. 02/26/2020 upon evaluation today patient appears to actually be doing extremely well he is completely healed and overall is looking excellent. They did get the compression stockings ordered yesterday so they should be on the way hopefully arriving if not tomorrow than Friday. In the meantime we may just utilize a Ace wrap to try to keep the edema under as much control as possible until he gets his compression stockings. READMISSION 04/03/2021 This is an 84 year old man we had for 2 visits in the clinic in September seen by Jeri Cos. Patient has chronic venous insufficiency with secondary lymphedema. He had wounds on the left anterior leg these healed out. He is also known to Dr. Doren Custard last seen in August 2020. He is known to have an occluded PTA with monophasic waveforms has an 8 occluded tibioperoneal trunk. At the time he was not felt to be a candidate for angiography because of his stage IV chronic renal failure and his wounds were closed. If he was to require angiography he would need a CO2 angiogram. His last arterial studies were in August 2020 as well. He had triphasic waveforms down to the mid popliteal biphasic distally and then monophasic at the dorsalis of the tibioperoneal trunk, ATA occluded PTA monophasic distal PTA his peroneal was not visualized. He wears compression stockings but did not bring 1 in today He thinks the wound currently we are looking at started about 2 weeks ago. He thinks it is because of abrasion on his foot wear. He has not been wearing his shoe he switched into a left surgical sandal. He has been cleaning this with saline and wrapping it  with gauze. ABI in our clinic was 0.52. Please visit previous arterial work-up by Dr. Doren Custard in August 2020 as noted. 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 We use silver collagen. He has known PAD and very significant lymphedema. I have not ordered arterial studies or vascular consult unless this area worsens 11/12; this is a patient with a punched-out wound over the interphalangeal joint of his left great toe dorsally. He has severe PAD which is not listed is being amenable to revascularization or angiography because of his stage IV chronic renal failure. We have been using collagen Electronic Signature(s) Signed: 04/27/2020 9:07:52 AM By: Linton Ham MD Entered By: Linton Ham on 04/24/2020 10:12:30 -------------------------------------------------------------------------------- Physical Exam Details Patient Name: Date of Service: Micheal Quails D. 04/24/2020 9:00 A M Medical Record Number: [id number] Patient Account Number: [id number] Date of Birth/Sex: Treating RN: Aug 05, 1935 (84 y.o. [name] Primary Care Provider: [name] Other Clinician: Referring Provider: Treating Provider/Extender: Lynden Ang in Treatment: 3 Constitutional Sitting or standing Blood Pressure is within target range for patient.. Pulse regular and within target range for patient.Marland Kitchen Respirations regular, non-labored and within target range.. Temperature is normal and within the target range for the patient.Marland Kitchen Appears in no distress. Notes Wound exam; this is a small punched-out area on the inner phalangeal joint of the left great toe. Although the orifice is small. This goes down 0.3 cm and probes laterally. I could not identify any palpable bone. I removed callus and overhanging skin and subcutaneous tissue last time he was here. This does not seem to have helped. Electronic Signature(s) Signed: 04/27/2020 9:07:52 AM By: Linton Ham MD Entered By: Linton Ham on 04/24/2020 10:13:26 -------------------------------------------------------------------------------- Physician Orders  Details Patient Name: Date of Service: Micheal Quails D. 04/24/2020 9:00 A M Medical Record Number: [id number] Patient Account Number: [id number] Date of Birth/Sex: Treating RN: 08/22/1935 (84 y.o. [name] Primary Care Provider: [name] Other Clinician: Referring Provider: Treating Provider/Extender: Lynden Ang in Treatment: 3 Verbal / Phone Orders: No Diagnosis Coding ICD-10 Coding Code Description E11.621 Type 2 diabetes mellitus with foot ulcer E11.51 Type 2 diabetes mellitus with diabetic peripheral angiopathy without gangrene L97.521 Non-pressure chronic ulcer of other part of left foot limited to breakdown of skin Follow-up Appointments Return appointment in 3 weeks. Dressing Change Frequency Wound #1 Left T Great oe Change Dressing every other day. Skin Barriers/Peri-Wound Care Moisturizing lotion - to leg daily Wound Cleansing May shower with protection. May shower and wash wound with soap and water. - on days dressing is changed Primary Wound Dressing Wound #1 Left T Great oe Endoform - be sure to tuck into undermining, moisten with saline Secondary Dressing Wound #1 Left T Great oe Kerlix/Rolled Gauze Dry Gauze Edema Control Patient to wear own compression stockings - left leg daily Elevate legs to the level of the heart or above for 30 minutes daily and/or when sitting, a frequency of: - throughout the day Exercise regularly Support Garment 20-30 mm/Hg pressure to: - compression stocking to left leg daily Off-Loading Open toe surgical shoe to: - left foot daily Electronic Signature(s) Signed: 04/24/2020 5:58:22 PM By: Baruch Gouty RN, BSN Signed: 04/27/2020 9:07:52 AM By: Linton Ham MD Entered By: Baruch Gouty on 04/24/2020 10:08:36 -------------------------------------------------------------------------------- Problem List Details Patient Name: Date  of Service: Micheal Walls, Micheal D. 04/24/2020  9:00 A M Medical Record Number: [id number] Patient Account Number: [id number] Date of Birth/Sex: Treating RN: [birth year] (84 y.o. [name] Primary Care Provider: [name] Other Clinician: Referring Provider: Treating Provider/Extender: Lynden Ang in Treatment: 3 Active Problems ICD-10 Encounter Code Description Active Date MDM Diagnosis E11.621 Type 2 diabetes mellitus with foot ulcer 04/03/2020 No Yes E11.51 Type 2 diabetes mellitus with diabetic peripheral angiopathy without gangrene 04/03/2020 No Yes L97.521 Non-pressure chronic ulcer of other part of left foot limited to breakdown of 04/03/2020 No Yes skin Inactive Problems Resolved Problems Electronic Signature(s) Signed: 04/27/2020 9:07:52 AM By: Linton Ham MD Entered By: Linton Ham on 04/24/2020 10:10:16 -------------------------------------------------------------------------------- Progress Note Details Patient Name: Date of Service: Micheal Quails D. 04/24/2020 9:00 A M Medical Record Number: [id number] Patient Account Number: [id number] Date of Birth/Sex: Treating RN: December 14, 1935 (84 y.o. [name] Primary Care Provider: [name] Other Clinician: Referring Provider: Treating Provider/Extender: Lynden Ang in Treatment: 3 Subjective History of Present Illness (HPI) 02/19/2020 upon evaluation today patient presents today for initial evaluation in clinic concerning issues that has been having with his bilateral lower extremities. He apparently also had a wound on his great toe. With that being said he does have a history of diabetes mellitus type 2, lymphedema, peripheral vascular disease, congestive heart failure, hypertension, right below-knee amputation, and chronic kidney disease stage IV according to records reviewed with his creatinine clearance being below 30. With that being said the legs have been actually worse than what they  are currently he does not seem to have any open wounds at this point significantly he does have a small leaking area on the left leg at this point. With that being said the majority of what I see is a lot of skin changes secondary to the chronic lymphedema to be honest. There does not appear to be any signs of systemic infection or local infection based on what I see today. The patient is doing much better with regard to the wound on his toe as well according to the daughter. He has been seeing podiatry. They do have questions about whether or not he can perform physical therapy to have no limitations on physical therapy with regard to his wounds to be honest. With that being said he questions having some pain in the ankle region which again I am not really the specialist to comment on that therefore I recommended that he probably needs to discuss this with his podiatrist. 02/26/2020 upon evaluation today patient appears to actually be doing extremely well he is completely healed and overall is looking excellent. They did get the compression stockings ordered yesterday so they should be on the way hopefully arriving if not tomorrow than Friday. In the meantime we may just utilize a Ace wrap to try to keep the edema under as much control as possible until he gets his compression stockings. READMISSION 04/03/2021 This is an 84 year old man we had for 2 visits in the clinic in September seen by Jeri Cos. Patient has chronic venous insufficiency with secondary lymphedema. He had wounds on the left anterior leg these healed out. He is also known to Dr. Doren Custard last seen in August 2020. He is known to have an occluded PTA with monophasic waveforms has an 8 occluded tibioperoneal trunk. At the time he was not felt to be a candidate for angiography because of his stage IV chronic renal failure and his  wounds were closed. If he was to require angiography he would need a CO2 angiogram. His last arterial studies  were in August 2020 as well. He had triphasic waveforms down to the mid popliteal biphasic distally and then monophasic at the dorsalis of the tibioperoneal trunk, ATA occluded PTA monophasic distal PTA his peroneal was not visualized. He wears compression stockings but did not bring 1 in today He thinks the wound currently we are looking at started about 2 weeks ago. He thinks it is because of abrasion on his foot wear. He has not been wearing his shoe he switched into a left surgical sandal. He has been cleaning this with saline and wrapping it with gauze. ABI in our clinic was 0.52. Please visit previous arterial work-up by Dr. Doren Custard in August 2020 as noted. 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 We use silver collagen. He has known PAD and very significant lymphedema. I have not ordered arterial studies or vascular consult unless this area worsens 11/12; this is a patient with a punched-out wound over the interphalangeal joint of his left great toe dorsally. He has severe PAD which is not listed is being amenable to revascularization or angiography because of his stage IV chronic renal failure. We have been using collagen Objective Constitutional Sitting or standing Blood Pressure is within target range for patient.. Pulse regular and within target range for patient.Marland Kitchen Respirations regular, non-labored and within target range.. Temperature is normal and within the target range for the patient.Marland Kitchen Appears in no distress. Vitals Time Taken: 9:50 AM, Height: 73 in, Weight: 275 lbs, BMI: 36.3, Temperature: 97.8 F, Pulse: 85 bpm, Respiratory Rate: 19 breaths/min, Blood Pressure: 124/67 mmHg. General Notes: Wound exam; this is a small punched-out area on the inner phalangeal joint of the left great toe. Although the orifice is  small. This goes down 0.3 cm and probes laterally. I could not identify any palpable bone. I removed callus and overhanging skin and subcutaneous tissue last time he was here. This does not seem to have helped. Integumentary (Hair, Skin) Wound #1 status is Open. Original cause of wound was Blister. The wound is located on the Left T Great. The wound measures 0.3cm length x 0.3cm width x oe 0.3cm depth; 0.071cm^2 area and 0.021cm^3 volume. There is Fat Layer (Subcutaneous Tissue) exposed. There is no tunneling noted, however, there is undermining starting at 12:00 and ending at 12:00 with a maximum distance of 0.3cm. There is a small amount of serosanguineous drainage noted. The wound margin is well defined and not attached to the wound base. There is large (67-100%) pink, pale granulation within the wound bed. There is a small (1-33%) amount of necrotic tissue within the wound bed including Adherent Slough. Assessment Active Problems ICD-10 Type 2 diabetes mellitus with foot ulcer Type 2 diabetes mellitus with diabetic peripheral angiopathy without gangrene Non-pressure chronic ulcer of other part of left foot limited to breakdown of skin Plan Follow-up Appointments: Return appointment in 3 weeks. Dressing Change Frequency: Wound #1 Left T Great: oe Change Dressing every other day. Skin Barriers/Peri-Wound Care: Moisturizing lotion - to leg daily Wound Cleansing: May shower with protection. May shower and wash wound with soap and water. - on days dressing is changed Primary Wound Dressing: Wound #1 Left T Great: oe Endoform - be sure to tuck  into undermining, moisten with saline Secondary Dressing: Wound #1 Left T Great: oe Kerlix/Rolled Gauze Dry Gauze Edema Control: Patient to wear own compression stockings - left leg daily Elevate legs to the level of the heart or above for 30 minutes daily and/or when sitting, a frequency of: - throughout the day Exercise  regularly Support Garment 20-30 mm/Hg pressure to: - compression stocking to left leg daily Off-Loading: Open toe surgical shoe to: - left foot daily 1. I change the primary dressing to endoform change every 2 2. I'm hopeful to stimulate some granulation before doing any more debridement of the overhanging skin and subcutaneous tissue 3. This does not go to bone 4. He was reviewed by Dr. Doren Custard in 2020 with regards to his arterial status. If we cannot stimulate any healing here we may have to return him there. I also wondered about [employer]) Signed: 04/27/2020 9:07:52 AM By: Linton Ham MD Entered By: Linton Ham on 04/24/2020 10:14:32 -------------------------------------------------------------------------------- SuperBill Details Patient Name: Date of Service: Micheal Quails D. 04/24/2020 Medical Record Number: [id number] Patient Account Number: [id number] Date of Birth/Sex: Treating RN: 06-Aug-1935 (84 y.o. [name] Primary Care Provider: [name] Other Clinician: Referring Provider: Treating Provider/Extender: Lynden Ang in Treatment: 3 Diagnosis Coding ICD-10 Codes Code Description [id number] Type 2 diabetes mellitus with foot ulcer E11.51 Type 2 diabetes mellitus with diabetic peripheral angiopathy without gangrene L97.521 Non-pressure chronic ulcer of other part of left foot limited to breakdown of skin Facility Procedures CPT4 Code: [id number] Description: 99213 - WOUND CARE VISIT-LEV 3 EST PT Modifier: Quantity: 1 Physician Procedures : CPT4 Code Description Modifier [id number] - WC PHYS LEVEL 3 - EST PT ICD-10 Diagnosis Description E11.621 Type 2 diabetes mellitus with foot ulcer E11.51 Type 2 diabetes mellitus with diabetic peripheral angiopathy without gangrene L97.521  Non-pressure chronic ulcer of other part of left foot limited to breakdown of skin Quantity: 1 Electronic Signature(s) Signed:  04/27/2020 9:07:52 AM By: Linton Ham MD Entered By: Linton Ham on 04/24/2020 10:14:52

## 2020-04-27 NOTE — Progress Notes (Signed)
Micheal Walls ([id number]) Visit Report for 04/24/2020 Arrival Information Details Patient Name: Date of Service: Micheal Walls, Micheal Walls 04/24/2020 9:00 A M Medical Record Number: [id number] Patient Account Number: [id number] Date of Birth/Sex: Treating RN: 02/19/1936 (84 y.o. [name] Primary Care Anae Hams: [name] Other Clinician: Referring Antinio Sanderfer: Treating Latana Colin/Extender: Lynden Ang in Treatment: 3 Visit Information History Since Last Visit Added or deleted any medications: No Patient Arrived: Wheel Chair Any new allergies or adverse reactions: No Arrival Time: 09:50 Had a fall or experienced change in No Accompanied By: grandson activities of daily living that may affect Transfer Assistance: None risk of falls: Patient Identification Verified: Yes Signs or symptoms of abuse/neglect since last visito No Secondary Verification Process Completed: Yes Hospitalized since last visit: No Implantable device outside of the clinic excluding No cellular tissue based products placed in the center since last visit: Has Dressing in Place as Prescribed: Yes Pain Present Now: No Electronic Signature(s) Signed: 04/24/2020 10:09:56 AM By: Sandre Kitty Entered By: Sandre Kitty on 04/24/2020 09:50:47 -------------------------------------------------------------------------------- Encounter Discharge Information Details Patient Name: Date of Service: Micheal Quails D. 04/24/2020 9:00 A M Medical Record Number: [id number] Patient Account Number: [id number] Date of Birth/Sex: Treating RN: 1935-09-07 (84 y.o. Hessie Diener Primary Care Steward Sames: [name] Other Clinician: Referring Tersa Fotopoulos: Treating Paras Kreider/Extender: Lynden Ang in Treatment: 3 Encounter Discharge Information Items Discharge Condition: Stable Ambulatory Status: Wheelchair Discharge Destination: Home Transportation: Private  Auto Accompanied By: self Schedule Follow-up Appointment: Yes Clinical Summary of Care: Electronic Signature(s) Signed: 04/24/2020 5:09:42 PM By: Deon Pilling Entered By: Deon Pilling on 04/24/2020 12:49:34 -------------------------------------------------------------------------------- Lower Extremity Assessment Details Patient Name: Date of Service: Micheal Walls, Micheal D. 04/24/2020 9:00 A M Medical Record Number: [id number] Patient Account Number: [id number] Date of Birth/Sex: Treating RN: [birth year] (84 y.o. [name] Primary Care Micah Galeno: [name] Other Clinician: Referring Yazen Rosko: Treating Noel Henandez/Extender: Lynden Ang in Treatment: 3 Edema Assessment Assessed: [Left: No] [Right: No] Edema: [Left: Ye] [Right: s] Calf Left: Right: Point of Measurement: 40 cm From Medial Instep 42.5 cm Ankle Left: Right: Point of Measurement: 13 cm From Medial Instep 34.7 cm Vascular Assessment Pulses: Dorsalis Pedis Palpable: [Left:No] Electronic Signature(s) Signed: 04/24/2020 5:58:22 PM By: Baruch Gouty RN, BSN Entered By: Baruch Gouty on 04/24/2020 09:59:55 -------------------------------------------------------------------------------- Multi Wound Chart Details Patient Name: Date of Service: Micheal Quails D. 04/24/2020 9:00 A M Medical Record Number: [id number] Patient Account Number: [id number] Date of Birth/Sex: Treating RN: 1935/07/16 (84 y.o. [name] Primary Care Benicia Bergevin: [name] Other Clinician: Referring Gurshan Settlemire: Treating Helton Oleson/Extender: Lynden Ang in Treatment: 3 Vital Signs Height(in): 38 Pulse(bpm): 45 Weight(lbs): 275 Blood Pressure(mmHg): 124/67 Body Mass Index(BMI): 36 Temperature(F): 97.8 Respiratory Rate(breaths/min): 19 Photos: [1:No Photos Left T Great oe] [N/A:N/A N/A] Wound Location: [1:Blister] [N/A:N/A] Wounding Event: [1:Diabetic Wound/Ulcer of  the Lower] [N/A:N/A] Primary Etiology: [1:Extremity Cataracts, Anemia, Lymphedema,] [N/A:N/A] Comorbid History: [1:Sleep Apnea, Congestive Heart Failure, Hypertension, Peripheral Arterial Disease, Peripheral Venous Disease, Type II Diabetes, End Stage Renal Disease, Gout, Neuropathy 03/27/2020] [N/A:N/A] Date Acquired: [1:3] [N/A:N/A] Weeks of Treatment: [1:Open] [N/A:N/A] Wound Status: [1:0.3x0.3x0.3] [N/A:N/A] Measurements L x W x D (cm) [1:0.071] [N/A:N/A] A (cm) : rea [1:0.021] [N/A:N/A] Volume (cm) : [1:-129.00%] [N/A:N/A] % Reduction in A rea: [1:-10.50%] [N/A:N/A] % Reduction in Volume: [1:12] Starting Position 1 (o'clock): [1:12] Ending Position 1 (o'clock): [1:0.3] Maximum Distance 1 (cm): [1:Yes] [N/A:N/A] Undermining: [1:Grade 2] [N/A:N/A] Classification: [1:Small] [N/A:N/A] Exudate A  mount: [1:Serosanguineous] [N/A:N/A] Exudate Type: [1:red, brown] [N/A:N/A] Exudate Color: [1:Well defined, not attached] [N/A:N/A] Wound Margin: [1:Large (67-100%)] [N/A:N/A] Granulation A mount: [1:Pink, Pale] [N/A:N/A] Granulation Quality: [1:Small (1-33%)] [N/A:N/A] Necrotic A mount: [1:Fat Layer (Subcutaneous Tissue): Yes N/A] Exposed Structures: [1:Fascia: No Tendon: No Muscle: No Joint: No Bone: No Small (1-33%)] [N/A:N/A] Treatment Notes Electronic Signature(s) Signed: 04/24/2020 5:58:22 PM By: Baruch Gouty RN, BSN Signed: 04/27/2020 9:07:52 AM By: Linton Ham MD Entered By: Linton Ham on 04/24/2020 10:11:30 -------------------------------------------------------------------------------- Multi-Disciplinary Care Plan Details Patient Name: Date of Service: Micheal Quails D. 04/24/2020 9:00 A M Medical Record Number: [id number] Patient Account Number: [id number] Date of Birth/Sex: Treating RN: [birth year] (84 y.o. [name] Primary Care Naftula Donahue: [name] Other Clinician: Referring Cailyn Houdek: Treating Evelena Masci/Extender: Lynden Ang in Treatment: 3 Active Inactive Nutrition Nursing Diagnoses: Impaired glucose control: actual or potential Potential for alteratiion in Nutrition/Potential for imbalanced nutrition Goals: Patient/caregiver will maintain therapeutic glucose control Date Initiated: 04/03/2020 Target Resolution Date: 05/01/2020 Goal Status: Active Interventions: Assess HgA1c results as ordered upon admission and as needed Provide education on elevated blood sugars and impact on wound healing Treatment Activities: Patient referred to Primary Care Physician for further nutritional evaluation : 04/03/2020 Notes: Wound/Skin Impairment Nursing Diagnoses: Impaired tissue integrity Knowledge deficit related to ulceration/compromised skin integrity Goals: Patient/caregiver will verbalize understanding of skin care regimen Date Initiated: 04/03/2020 Target Resolution Date: 05/01/2020 Goal Status: Active Ulcer/skin breakdown will have a volume reduction of 50% by week 8 Date Initiated: 04/03/2020 Target Resolution Date: 05/01/2020 Goal Status: Active Interventions: Assess patient/caregiver ability to obtain necessary supplies Assess patient/caregiver ability to perform ulcer/skin care regimen upon admission and as needed Assess ulceration(s) every visit Provide education on ulcer and skin care Treatment Activities: Skin care regimen initiated : 04/03/2020 Topical wound management initiated : 04/03/2020 Notes: Electronic Signature(s) Signed: 04/24/2020 5:58:22 PM By: Baruch Gouty RN, BSN Entered By: Baruch Gouty on 04/24/2020 10:00:41 -------------------------------------------------------------------------------- Pain Assessment Details Patient Name: Date of Service: Micheal Quails D. 04/24/2020 9:00 A M Medical Record Number: [id number] Patient Account Number: [id number] Date of Birth/Sex: Treating RN: 1935-08-27 (84 y.o. [name] Primary Care Falicity Sheets: [name] Other Clinician: Referring Mairely Foxworth: Treating Anjelica Gorniak/Extender: Lynden Ang in Treatment: 3 Active Problems Location of Pain Severity and Description of Pain Patient Has Paino No Site Locations Pain Management and Medication Current Pain Management: Electronic Signature(s) Signed: 04/24/2020 10:09:56 AM By: Sandre Kitty Signed: 04/24/2020 5:58:22 PM By: Baruch Gouty RN, BSN Entered By: Sandre Kitty on 04/24/2020 09:51:09 -------------------------------------------------------------------------------- Patient/Caregiver Education Details Patient Name: Date of Service: Micheal Walls 11/12/2021andnbsp9:00 A M Medical Record Number: [id number] Patient Account Number: [id number] Date of Birth/Gender: Treating RN: 1935/12/10 (84 y.o. [name] Primary Care Physician: [name] Other Clinician: Referring Physician: Treating Physician/Extender: Lynden Ang in Treatment: 3 Education Assessment Education Provided To: Patient Education Topics Provided Elevated Blood Sugar/ Impact on Healing: Methods: Explain/Verbal Responses: Reinforcements needed, State content correctly Wound/Skin Impairment: Methods: Explain/Verbal Responses: Reinforcements needed, State content correctly Electronic Signature(s) Signed: 04/24/2020 5:58:22 PM By: Baruch Gouty RN, BSN Entered By: Baruch Gouty on 04/24/2020 10:00:59 -------------------------------------------------------------------------------- Wound Assessment Details Patient Name: Date of Service: Micheal Quails D. 04/24/2020 9:00 A M Medical Record Number: [id number] Patient Account Number: [id number] Date of Birth/Sex: Treating RN: 11-07-1935 (84 y.o. [name] Primary Care Nicoles Sedlacek: [name] Other Clinician: Referring Keyontae Huckeby: Treating Marlaine Arey/Extender: Lynden Ang in Treatment: 3 Wound Status Wound  Number: 1 Primary Diabetic Wound/Ulcer of the Lower Extremity Etiology: Wound Location: Left T Great oe Wound Open Wounding Event: Blister Status: Date Acquired: 03/27/2020 Comorbid Cataracts, Anemia, Lymphedema, Sleep Apnea, Congestive Heart Weeks Of Treatment: 3 History: Failure, Hypertension, Peripheral Arterial Disease, Peripheral Venous Clustered Wound: No Disease, Type II Diabetes, End Stage Renal Disease, Gout, Neuropathy Wound Measurements Length: (cm) 0.3 Width: (cm) 0.3 Depth: (cm) 0.3 Area: (cm) 0.071 Volume: (cm) 0.021 % Reduction in Area: -129% % Reduction in Volume: -10.5% Epithelialization: Small (1-33%) Tunneling: No Undermining: Yes Starting Position (o'clock): 12 Ending Position (o'clock): 12 Maximum Distance: (cm) 0.3 Wound Description Classification: Grade 2 Wound Margin: Well defined, not attached Exudate Amount: Small Exudate Type: Serosanguineous Exudate Color: red, brown Foul Odor After Cleansing: No Slough/Fibrino Yes Wound Bed Granulation Amount: Large (67-100%) Exposed Structure Granulation Quality: Pink, Pale Fascia Exposed: No Necrotic Amount: Small (1-33%) Fat Layer (Subcutaneous Tissue) Exposed: Yes Necrotic Quality: Adherent Slough Tendon Exposed: No Muscle Exposed: No Joint Exposed: No Bone Exposed: No Treatment Notes Wound #1 (Left Toe Great) 1. Cleanse With Wound Cleanser Soap and water 3. Primary Dressing Applied Endoform 4. Secondary Dressing Dry Gauze Roll Gauze 5. Secured With Medipore tape 7. Footwear/Offloading device applied Surgical shoe Electronic Signature(s) Signed: 04/24/2020 5:58:22 PM By: Baruch Gouty RN, BSN Entered By: Baruch Gouty on 04/24/2020 10:00:23 -------------------------------------------------------------------------------- Vitals Details Patient Name: Date of Service: Micheal Quails D. 04/24/2020 9:00 A M Medical Record Number: [id number] Patient Account Number: [id number] Date  of Birth/Sex: Treating RN: Sep 25, 1935 (84 y.o. [name] Primary Care Indalecio Malmstrom: [name] Other Clinician: Referring Arminta Gamm: Treating Carsin Randazzo/Extender: Lynden Ang in Treatment: 3 Vital Signs Time Taken: 09:50 Temperature (F): 97.8 Height (in): 73 Pulse (bpm): 85 Weight (lbs): 275 Respiratory Rate (breaths/min): 19 Body Mass Index (BMI): 36.3 Blood Pressure (mmHg): 124/67 Reference Range: 80 - 120 mg / dl Electronic Signature(s) Signed: 04/24/2020 10:09:56 AM By: Sandre Kitty Entered By: Sandre Kitty on 04/24/2020 09:51:03

## 2020-04-30 ENCOUNTER — Other Ambulatory Visit: Payer: Self-pay

## 2020-04-30 MED ORDER — FREESTYLE LIBRE 14 DAY SENSOR MISC: 2 refills | Status: DC

## 2020-04-30 MED ORDER — LOSARTAN POTASSIUM 100 MG PO TABS
50.0000 mg | ORAL_TABLET | Freq: Every day | ORAL | 0 refills | Status: DC
Start: 2020-04-30 — End: 2020-07-15

## 2020-05-11 ENCOUNTER — Other Ambulatory Visit: Payer: Self-pay

## 2020-05-11 MED ORDER — FREESTYLE LIBRE 14 DAY SENSOR MISC: 2 refills | Status: DC

## 2020-05-13 ENCOUNTER — Other Ambulatory Visit: Payer: Self-pay | Admitting: Nurse Practitioner

## 2020-05-13 ENCOUNTER — Other Ambulatory Visit: Payer: Self-pay

## 2020-05-13 MED ORDER — FREESTYLE LIBRE 14 DAY SENSOR MISC: 1 refills | Status: DC

## 2020-05-15 ENCOUNTER — Ambulatory Visit (HOSPITAL_COMMUNITY)
Admission: RE | Admit: 2020-05-15 | Discharge: 2020-05-15 | Disposition: A | Discharge status: Home / Self Care | Payer: Medicare Other | Source: Ambulatory Visit | Attending: Internal Medicine | Admitting: Internal Medicine

## 2020-05-15 ENCOUNTER — Other Ambulatory Visit: Payer: Self-pay

## 2020-05-15 ENCOUNTER — Emergency Department (HOSPITAL_COMMUNITY)
Admission: EM | Admit: 2020-05-15 | Discharge: 2020-05-15 | Discharge status: Against Medical Advice | Payer: Medicare Other

## 2020-05-15 ENCOUNTER — Other Ambulatory Visit (HOSPITAL_COMMUNITY): Payer: Self-pay | Admitting: Internal Medicine

## 2020-05-15 ENCOUNTER — Encounter (HOSPITAL_BASED_OUTPATIENT_CLINIC_OR_DEPARTMENT_OTHER): Payer: Medicare Other | Attending: Internal Medicine | Admitting: Internal Medicine

## 2020-05-15 ENCOUNTER — Other Ambulatory Visit (HOSPITAL_COMMUNITY)
Admission: RE | Admit: 2020-05-15 | Discharge: 2020-05-15 | Disposition: A | Discharge status: Home / Self Care | Payer: Medicare Other | Attending: Internal Medicine | Admitting: Internal Medicine

## 2020-05-15 DIAGNOSIS — N186 End stage renal disease: Secondary | ICD-10-CM | POA: Insufficient documentation

## 2020-05-15 DIAGNOSIS — L97522 Non-pressure chronic ulcer of other part of left foot with fat layer exposed: Secondary | ICD-10-CM | POA: Diagnosis not present

## 2020-05-15 DIAGNOSIS — E1140 Type 2 diabetes mellitus with diabetic neuropathy, unspecified: Secondary | ICD-10-CM | POA: Insufficient documentation

## 2020-05-15 DIAGNOSIS — E1151 Type 2 diabetes mellitus with diabetic peripheral angiopathy without gangrene: Secondary | ICD-10-CM | POA: Diagnosis not present

## 2020-05-15 DIAGNOSIS — L97521 Non-pressure chronic ulcer of other part of left foot limited to breakdown of skin: Secondary | ICD-10-CM | POA: Insufficient documentation

## 2020-05-15 DIAGNOSIS — I872 Venous insufficiency (chronic) (peripheral): Secondary | ICD-10-CM | POA: Insufficient documentation

## 2020-05-15 DIAGNOSIS — I890 Lymphedema, not elsewhere classified: Secondary | ICD-10-CM | POA: Insufficient documentation

## 2020-05-15 DIAGNOSIS — Z89511 Acquired absence of right leg below knee: Secondary | ICD-10-CM | POA: Diagnosis not present

## 2020-05-15 DIAGNOSIS — E1122 Type 2 diabetes mellitus with diabetic chronic kidney disease: Secondary | ICD-10-CM | POA: Insufficient documentation

## 2020-05-15 DIAGNOSIS — I132 Hypertensive heart and chronic kidney disease with heart failure and with stage 5 chronic kidney disease, or end stage renal disease: Secondary | ICD-10-CM | POA: Diagnosis not present

## 2020-05-15 DIAGNOSIS — Z8249 Family history of ischemic heart disease and other diseases of the circulatory system: Secondary | ICD-10-CM | POA: Diagnosis not present

## 2020-05-15 DIAGNOSIS — L089 Local infection of the skin and subcutaneous tissue, unspecified: Secondary | ICD-10-CM | POA: Insufficient documentation

## 2020-05-15 DIAGNOSIS — E11621 Type 2 diabetes mellitus with foot ulcer: Secondary | ICD-10-CM | POA: Diagnosis not present

## 2020-05-15 DIAGNOSIS — I10 Essential (primary) hypertension: Secondary | ICD-10-CM | POA: Diagnosis not present

## 2020-05-15 DIAGNOSIS — I6350 Cerebral infarction due to unspecified occlusion or stenosis of unspecified cerebral artery: Secondary | ICD-10-CM | POA: Diagnosis not present

## 2020-05-15 DIAGNOSIS — I509 Heart failure, unspecified: Secondary | ICD-10-CM | POA: Insufficient documentation

## 2020-05-15 DIAGNOSIS — I429 Cardiomyopathy, unspecified: Secondary | ICD-10-CM | POA: Diagnosis not present

## 2020-05-15 DIAGNOSIS — L97529 Non-pressure chronic ulcer of other part of left foot with unspecified severity: Secondary | ICD-10-CM | POA: Diagnosis not present

## 2020-05-15 IMAGING — CR DG FOOT COMPLETE 3+V*L*
3 series · 3 of 3 positions shown · non-contrast
Comparison: None.

CLINICAL DATA: Diabetic foot ulcer on left great toe. The ulcers
along the medial aspect of the great toe for 2 months. History of
diabetes.

EXAM:
LEFT FOOT - COMPLETE 3+ VIEW

[x foot ap left]
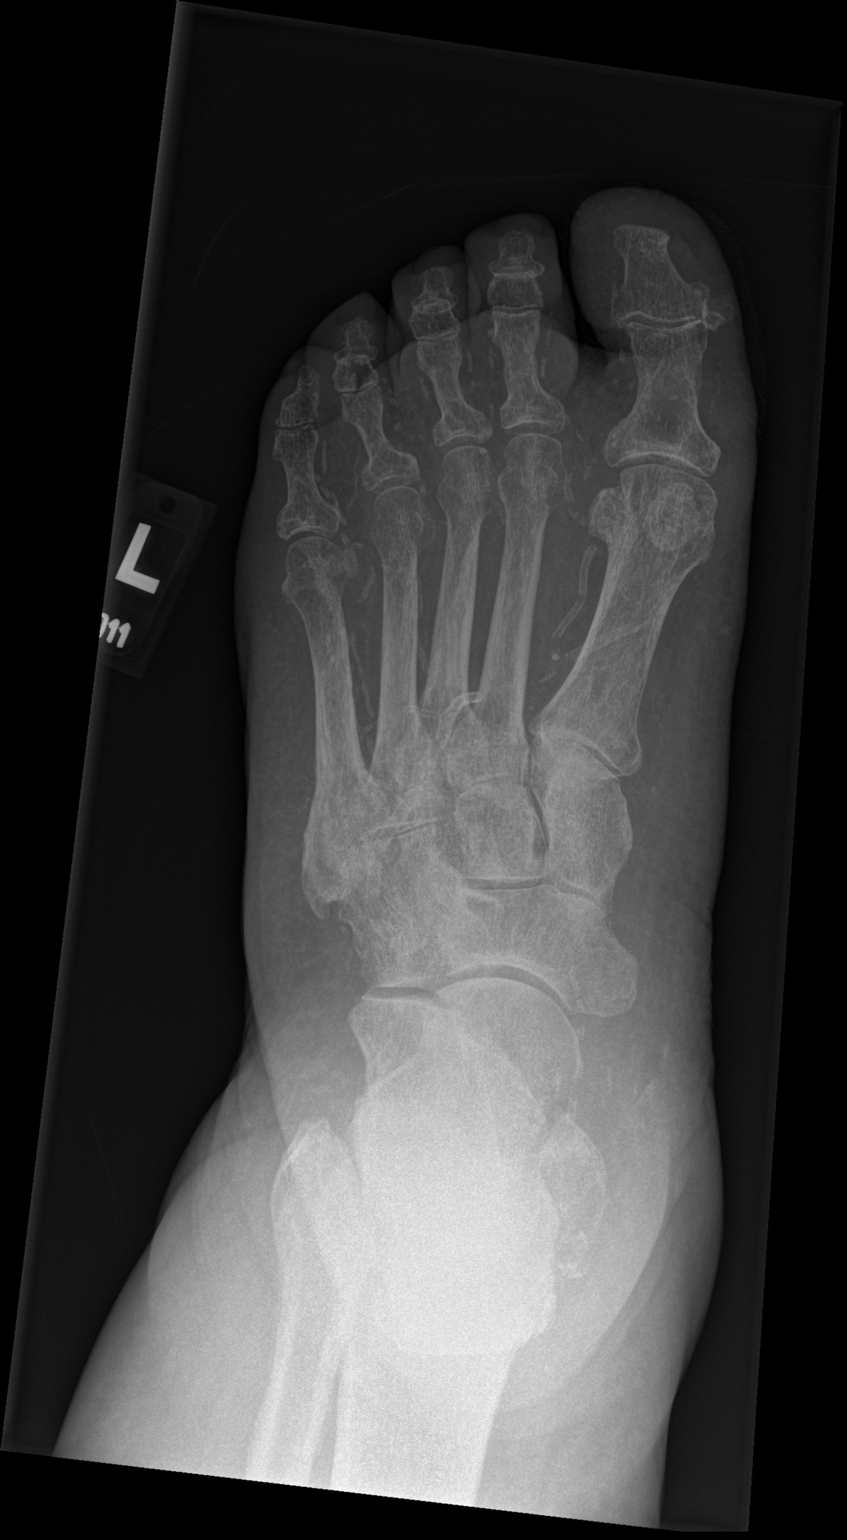

[x foot obl left]
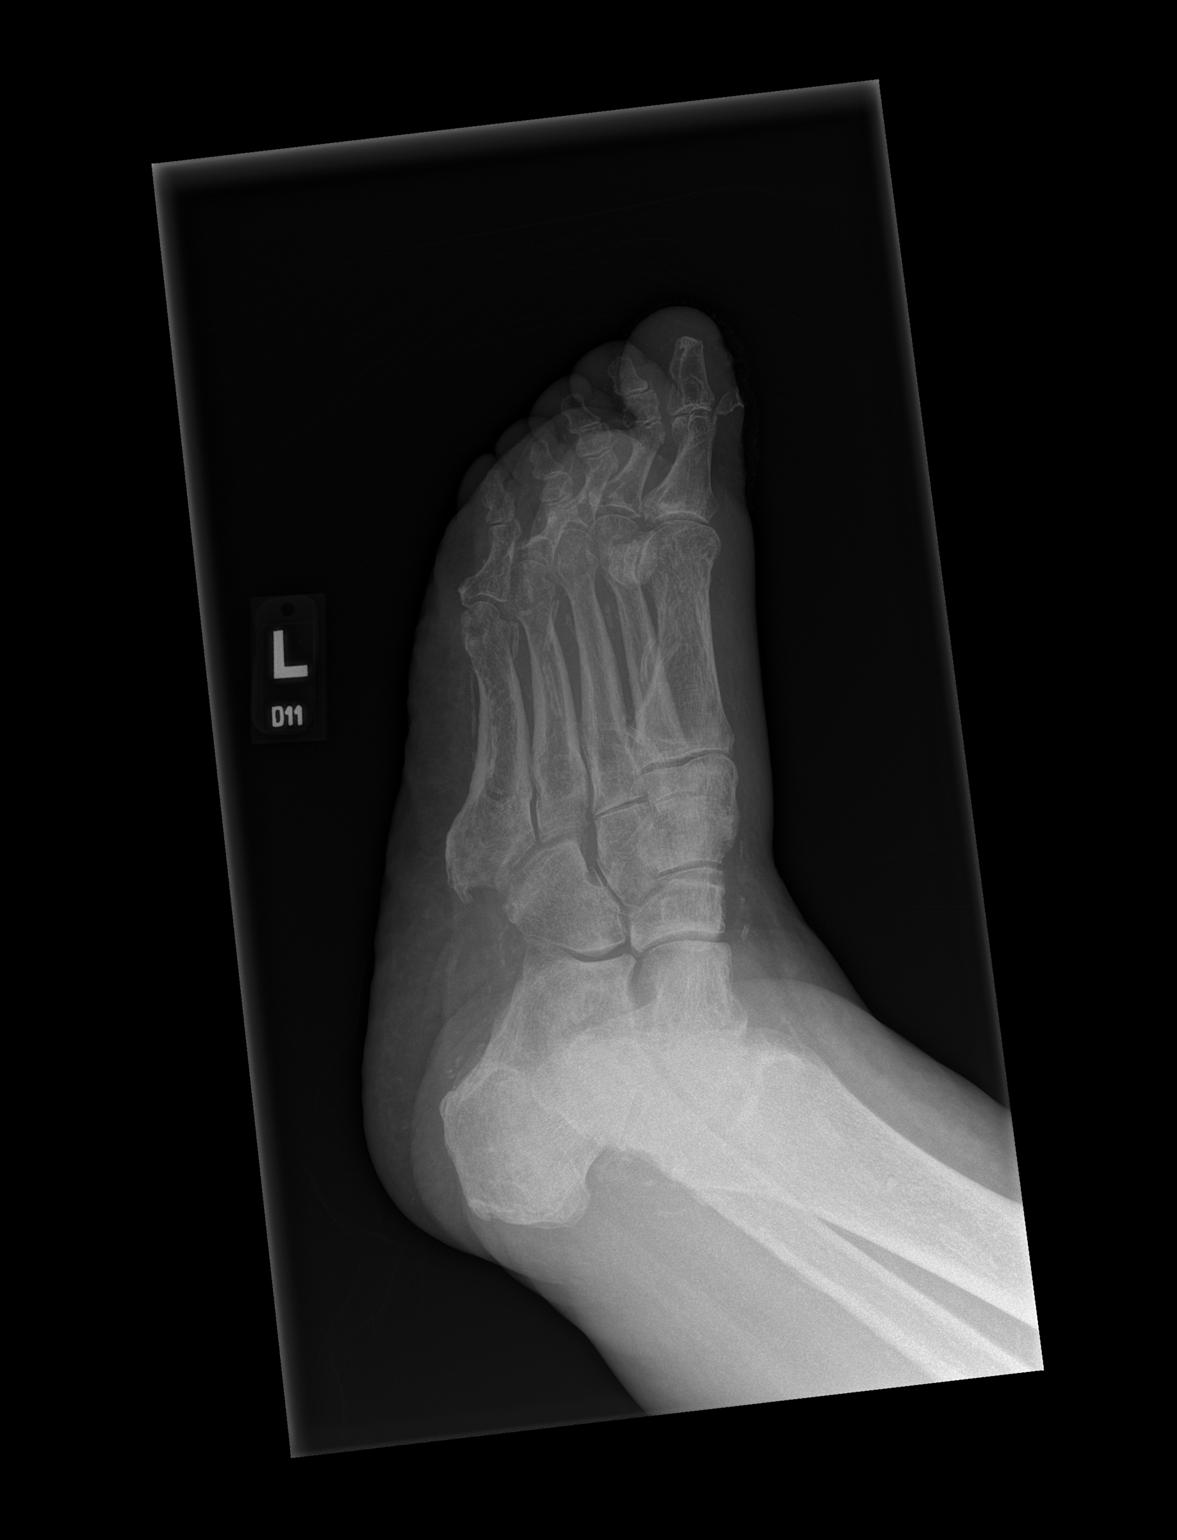

[x foot lat left]
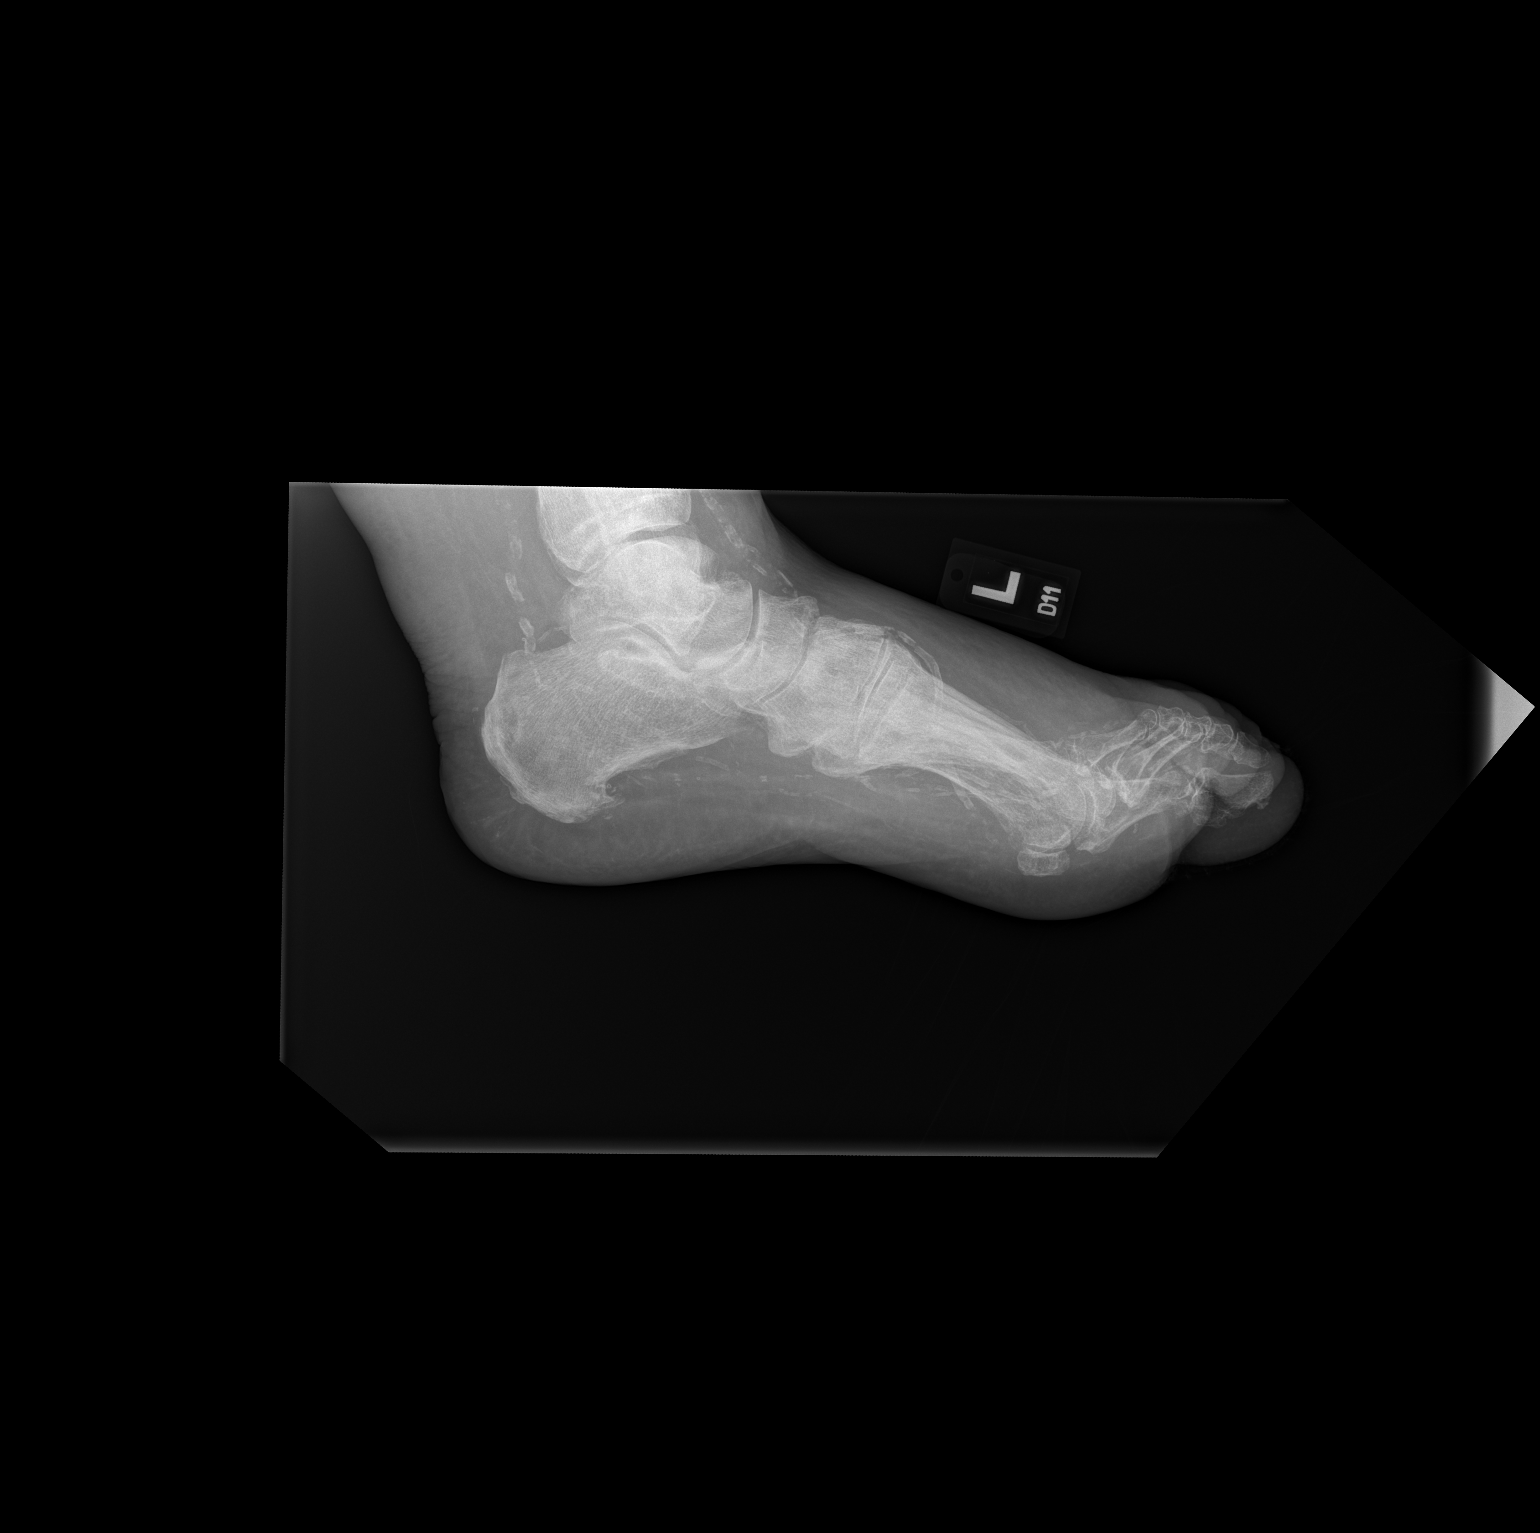

[3 of 3 positions shown; findings below may reference images not displayed]

FINDINGS: Vascular calcifications. Degenerative changes. No soft tissue gas
identified. No fractures. No evidence of osteomyelitis.
IMPRESSION: No evidence of osteomyelitis.  Vascular calcifications.

## 2020-05-15 NOTE — Progress Notes (Signed)
Micheal Walls ([id number]) Visit Report for 05/15/2020 Debridement Details Patient Name: Date of Service: Micheal Walls, Micheal Walls 05/15/2020 9:00 A M Medical Record Number: [id number] Patient Account Number: [id number] Date of Birth/Sex: Treating RN: [birth year] (84 y.o. Micheal Walls Primary Care Provider: [name] Other Clinician: Referring Provider: Treating Provider/Extender: Lynden Ang in Treatment: 6 Debridement Performed for Assessment: Wound #1 Left T Great oe Performed By: Physician Ricard Dillon., MD Debridement Type: Debridement Severity of Tissue Pre Debridement: Fat layer exposed Level of Consciousness (Pre-procedure): Awake and Alert Pre-procedure Verification/Time Out Yes - 09:55 Taken: Start Time: 09:56 Pain Control: Other : benzocaine 20% spray.5 T Area Debrided (L x W): otal 0.5 (cm) x 1 (cm) = 0.5 (cm) Tissue and other material debrided: Viable, Non-Viable, Subcutaneous, Skin: Epidermis Level: Skin/Subcutaneous Tissue Debridement Description: Excisional Instrument: Blade, Forceps Specimen: Swab, Number of Specimens T aken: 1 Bleeding: Minimum Hemostasis Achieved: Silver Nitrate End Time: 09:59 Procedural Pain: 0 Post Procedural Pain: 0 Response to Treatment: Procedure was tolerated well Level of Consciousness (Post- Awake and Alert procedure): Post Debridement Measurements of Total Wound Length: (cm) 0.5 Width: (cm) 0.4 Depth: (cm) 0.4 Volume: (cm) 0.063 Character of Wound/Ulcer Post Debridement: Improved Severity of Tissue Post Debridement: Fat layer exposed Post Procedure Diagnosis Same as Pre-procedure Electronic Signature(s) Signed: 05/15/2020 12:44:20 PM By: Linton Ham MD Signed: 05/15/2020 5:01:53 PM By: Baruch Gouty RN, BSN Entered By: Linton Ham on 05/15/2020 10:07:32 -------------------------------------------------------------------------------- HPI Details Patient Name: Date of  Service: Micheal Quails D. 05/15/2020 9:00 A M Medical Record Number: [id number] Patient Account Number: [id number] Date of Birth/Sex: Treating RN: 13-Jun-1936 (84 y.o. Micheal Walls Primary Care Provider: [name] Other Clinician: Referring Provider: Treating Provider/Extender: Lynden Ang in Treatment: 6 History of Present Illness HPI Description: 02/19/2020 upon evaluation today patient presents today for initial evaluation in clinic concerning issues that has been having with his bilateral lower extremities. He apparently also had a wound on his great toe. With that being said he does have a history of diabetes mellitus type 2, lymphedema, peripheral vascular disease, congestive heart failure, hypertension, right below-knee amputation, and chronic kidney disease stage IV according to records reviewed with his creatinine clearance being below 30. With that being said the legs have been actually worse than what they are currently he does not seem to have any open wounds at this point significantly he does have a small leaking area on the left leg at this point. With that being said the majority of what I see is a lot of skin changes secondary to the chronic lymphedema to be honest. There does not appear to be any signs of systemic infection or local infection based on what I see today. The patient is doing much better with regard to the wound on his toe as well according to the daughter. He has been seeing podiatry. They do have questions about whether or not he can perform physical therapy to have no limitations on physical therapy with regard to his wounds to be honest. With that being said he questions having some pain in the ankle region which again I am not really the specialist to comment on that therefore I recommended that he probably needs to discuss this with his podiatrist. 02/26/2020 upon evaluation today patient appears to actually be doing extremely  well he is completely healed and overall is looking excellent. They did get the compression stockings ordered yesterday so they should be on the way  hopefully arriving if not tomorrow than Friday. In the meantime we may just utilize a Ace wrap to try to keep the edema under as much control as possible until he gets his compression stockings. READMISSION 04/03/2021 This is an 84 year old man we had for 2 visits in the clinic in September seen by Jeri Cos. Patient has chronic venous insufficiency with secondary lymphedema. He had wounds on the left anterior leg these healed out. He is also known to Dr. Doren Custard last seen in August 2020. He is known to have an occluded PTA with monophasic waveforms has an 8 occluded tibioperoneal trunk. At the time he was not felt to be a candidate for angiography because of his stage IV chronic renal failure and his wounds were closed. If he was to require angiography he would need a CO2 angiogram. His last arterial studies were in August 2020 as well. He had triphasic waveforms down to the mid popliteal biphasic distally and then monophasic at the dorsalis of the tibioperoneal trunk, ATA occluded PTA monophasic distal PTA his peroneal was not visualized. He wears compression stockings but did not bring 1 in today He thinks the wound currently we are looking at started about 2 weeks ago. He thinks it is because of abrasion on his foot wear. He has not been wearing his shoe he switched into a left surgical sandal. He has been cleaning this with saline and wrapping it with gauze. ABI in our clinic was 0.52. Please visit previous arterial work-up by Dr. Doren Custard in August 2020 as noted. 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 We use silver collagen. He has known PAD and very significant lymphedema. I  have not ordered arterial studies or vascular consult unless this area worsens 11/12; this is a patient with a punched-out wound over the interphalangeal joint of his left great toe dorsally. He has severe PAD which is not listed is being amenable to revascularization or angiography because of his stage IV chronic renal failure. We have been using collagen 12/3; punched-out area over the inner phalangeal joint of the left great toe in the setting of known PAD. He was not felt previously to be a good revascularization candidate because of renal insufficiency. We have been using collagen. The patient states he noticed this draining about 2 to 3 days ago. When he arrived in clinic our intake nurse noted purulent drainage and wound a lot larger especially in depth of 0.7 cm Electronic Signature(s) Signed: 05/15/2020 12:44:20 PM By: Linton Ham MD Entered By: Linton Ham on 05/15/2020 10:10:03 -------------------------------------------------------------------------------- Physical Exam Details Patient Name: Date of Service: Micheal Quails D. 05/15/2020 9:00 A M Medical Record Number: [id number] Patient Account Number: [id number] Date of Birth/Sex: Treating RN: [birth year] (84 y.o. Micheal Walls Primary Care Provider: [name] Other Clinician: Referring Provider: Treating Provider/Extender: Lynden Ang in Treatment: 6 Constitutional Sitting or standing Blood Pressure is within target range for patient.. Pulse regular and within target range for patient.Marland Kitchen Respirations regular, non-labored and within target range.. Temperature is normal and within the target range for the patient.Marland Kitchen Appears in no distress. Cardiovascular Pedal pulses are absent. Notes Wound exam; there is certainly more depth here. Wound bed necrotic tissue and overhanging skin. I removed the overhanging skin and subcutaneous tissue with pickups and a scalpel. This has 0.7  cm of direct  depth I cannot exactly feel bone but it can make it cannot be far off this. There is not obviously a joint effusion over the inner phalangeal joint clinically. He has no pain but I think the area is neuropathy. Some swelling of the toe Electronic Signature(s) Signed: 05/15/2020 12:44:20 PM By: Linton Ham MD Entered By: Linton Ham on 05/15/2020 10:11:51 -------------------------------------------------------------------------------- Physician Orders Details Patient Name: Date of Service: Micheal Quails D. 05/15/2020 9:00 A M Medical Record Number: [id number] Patient Account Number: [id number] Date of Birth/Sex: Treating RN: 26-Dec-1935 (84 y.o. Micheal Walls Primary Care Provider: [name] Other Clinician: Referring Provider: Treating Provider/Extender: Lynden Ang in Treatment: 6 Verbal / Phone Orders: No Diagnosis Coding ICD-10 Coding Code Description E11.621 Type 2 diabetes mellitus with foot ulcer E11.51 Type 2 diabetes mellitus with diabetic peripheral angiopathy without gangrene L97.521 Non-pressure chronic ulcer of other part of left foot limited to breakdown of skin Follow-up Appointments Return Appointment in 1 week. Bathing/ Shower/ Hygiene May shower and wash wound with soap and water. Edema Control - Lymphedema / SCD / Other Bilateral Lower Extremities Elevate legs to the level of the heart or above for 30 minutes daily and/or when sitting, a frequency of: Avoid standing for long periods of time. Patient to wear own compression stockings every day. - both legs Exercise regularly Moisturize legs daily. Wound Treatment Wound #1 - T Great oe Wound Laterality: Left Cleanser: Soap and Water Every Other Day/15 Days Discharge Instructions: May shower and wash wound with dial antibacterial soap and water prior to dressing change. Prim Dressing: KerraCel Ag Gelling Fiber Dressing, 2x2 in (silver alginate) Every Other Day/15  Days ary Discharge Instructions: Apply silver alginate to wound bed pack lightly into hole. Secondary Dressing: Woven Gauze Sponges 2x2 in Every Other Day/15 Days Discharge Instructions: Apply over primary dressing as directed. Secured With: Child psychotherapist, Sterile 2x75 (in/in) Every Other Day/15 Days Discharge Instructions: Secure with stretch gauze as directed. Laboratory naerobe culture (MICRO) - left great toe Bacteria identified in Unspecified specimen by A LOINC Code: 409-8 Convenience Name: Anerobic culture Radiology X-ray, foot left complete view - diabetic foot ulcer left great toe, r/o osteomyolitis CPT 73630 - (ICD10 L97.521 - Non-pressure chronic ulcer of other part of left foot limited to breakdown of skin) Patient Medications llergies: No Known Drug Allergies A Notifications Medication Indication Start End wound infection 05/15/2020 doxycycline monohydrate DOSE oral 100 mg capsule - 1 capsule oral bid for 7 days Electronic Signature(s) Signed: 05/15/2020 10:14:05 AM By: Linton Ham MD Entered By: Linton Ham on 05/15/2020 10:14:04 Prescription 05/15/2020 -------------------------------------------------------------------------------- Roma Kayser MD Patient Name: Provider: Aug 21, 1935 [id number] Date of Birth: NPI#[id number] Mages [id number] Sex: DEA #: [phone] [id number] Phone #: License #: [place] Patient Address: [address] [place], [place] [address] [place], [place] [address] [phone] Allergies No Known Drug Allergies Provider's Orders X-ray, foot left complete view - ICD10: L97.521 - diabetic foot ulcer left great toe, r/o osteomyolitis CPT 73630 Hand Signature: Date(s): Electronic Signature(s) Signed: 05/15/2020 12:44:20 PM By: Linton Ham MD Entered By: Linton Ham on 05/15/2020  10:14:06 -------------------------------------------------------------------------------- Problem List Details Patient Name: Date of Service: Micheal Quails D. 05/15/2020 9:00 A M Medical Record Number: [id number] Patient Account Number: [id number] Date of Birth/Sex: Treating RN: [birth year] (84 y.o. Micheal Walls Primary Care Provider: [name] Other Clinician: Referring Provider: Treating Provider/Extender: Dellia Nims  Ileene Rubens, Doreene Burke Weeks in Treatment: 6 Active Problems ICD-10 Encounter Code Description Active Date MDM Diagnosis E11.621 Type 2 diabetes mellitus with foot ulcer 04/03/2020 No Yes E11.51 Type 2 diabetes mellitus with diabetic peripheral angiopathy without gangrene 04/03/2020 No Yes L97.521 Non-pressure chronic ulcer of other part of left foot limited to breakdown of 04/03/2020 No Yes skin Inactive Problems Resolved Problems Electronic Signature(s) Signed: 05/15/2020 12:44:20 PM By: Linton Ham MD Entered By: Linton Ham on 05/15/2020 10:07:13 -------------------------------------------------------------------------------- Progress Note Details Patient Name: Date of Service: Micheal Quails D. 05/15/2020 9:00 A M Medical Record Number: [id number] Patient Account Number: [id number] Date of Birth/Sex: Treating RN: 08-30-1935 (84 y.o. Micheal Walls Primary Care Provider: [name] Other Clinician: Referring Provider: Treating Provider/Extender: Lynden Ang in Treatment: 6 Subjective History of Present Illness (HPI) 02/19/2020 upon evaluation today patient presents today for initial evaluation in clinic concerning issues that has been having with his bilateral lower extremities. He apparently also had a wound on his great toe. With that being said he does have a history of diabetes mellitus type 2, lymphedema, peripheral vascular disease, congestive heart failure, hypertension, right below-knee amputation, and  chronic kidney disease stage IV according to records reviewed with his creatinine clearance being below 30. With that being said the legs have been actually worse than what they are currently he does not seem to have any open wounds at this point significantly he does have a small leaking area on the left leg at this point. With that being said the majority of what I see is a lot of skin changes secondary to the chronic lymphedema to be honest. There does not appear to be any signs of systemic infection or local infection based on what I see today. The patient is doing much better with regard to the wound on his toe as well according to the daughter. He has been seeing podiatry. They do have questions about whether or not he can perform physical therapy to have no limitations on physical therapy with regard to his wounds to be honest. With that being said he questions having some pain in the ankle region which again I am not really the specialist to comment on that therefore I recommended that he probably needs to discuss this with his podiatrist. 02/26/2020 upon evaluation today patient appears to actually be doing extremely well he is completely healed and overall is looking excellent. They did get the compression stockings ordered yesterday so they should be on the way hopefully arriving if not tomorrow than Friday. In the meantime we may just utilize a Ace wrap to try to keep the edema under as much control as possible until he gets his compression stockings. READMISSION 04/03/2021 This is an 84 year old man we had for 2 visits in the clinic in September seen by Jeri Cos. Patient has chronic venous insufficiency with secondary lymphedema. He had wounds on the left anterior leg these healed out. He is also known to Dr. Doren Custard last seen in August 2020. He is known to have an occluded PTA with monophasic waveforms has an 8 occluded tibioperoneal trunk. At the time he was not felt to be a candidate for  angiography because of his stage IV chronic renal failure and his wounds were closed. If he was to require angiography he would need a CO2 angiogram. His last arterial studies were in August 2020 as well. He had triphasic waveforms down to the mid popliteal biphasic distally and then monophasic at the  dorsalis of the tibioperoneal trunk, ATA occluded PTA monophasic distal PTA his peroneal was not visualized. He wears compression stockings but did not bring 1 in today He thinks the wound currently we are looking at started about 2 weeks ago. He thinks it is because of abrasion on his foot wear. He has not been wearing his shoe he switched into a left surgical sandal. He has been cleaning this with saline and wrapping it with gauze. ABI in our clinic was 0.52. Please visit previous arterial work-up by Dr. Doren Custard in August 2020 as noted. 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 We use silver collagen. He has known PAD and very significant lymphedema. I have not ordered arterial studies or vascular consult unless this area worsens 11/12; this is a patient with a punched-out wound over the interphalangeal joint of his left great toe dorsally. He has severe PAD which is not listed is being amenable to revascularization or angiography because of his stage IV chronic renal failure. We have been using collagen 12/3; punched-out area over the inner phalangeal joint of the left great toe in the setting of known PAD. He was not felt previously to be a good revascularization candidate because of renal insufficiency. We have been using collagen. The patient states he noticed this draining about 2 to 3 days ago. When he arrived in clinic our intake nurse noted purulent drainage and wound a lot larger especially in depth of 0.7  cm Objective Constitutional Sitting or standing Blood Pressure is within target range for patient.. Pulse regular and within target range for patient.Marland Kitchen Respirations regular, non-labored and within target range.. Temperature is normal and within the target range for the patient.Marland Kitchen Appears in no distress. Vitals Time Taken: 9:26 AM, Height: 73 in, Weight: 275 lbs, BMI: 36.3, Temperature: 98.4 F, Pulse: 85 bpm, Respiratory Rate: 18 breaths/min, Blood Pressure: 145/69 mmHg. Cardiovascular Pedal pulses are absent. General Notes: Wound exam; there is certainly more depth here. Wound bed necrotic tissue and overhanging skin. I removed the overhanging skin and subcutaneous tissue with pickups and a scalpel. This has 0.7 cm of direct depth I cannot exactly feel bone but it can make it cannot be far off this. There is not obviously a joint effusion over the inner phalangeal joint clinically. He has no pain but I think the area is neuropathy. Some swelling of the toe Integumentary (Hair, Skin) Wound #1 status is Open. Original cause of wound was Blister. The wound is located on the Left T Great. The wound measures 0.6cm length x 0.4cm width x oe 0.4cm depth; 0.188cm^2 area and 0.075cm^3 volume. There is Fat Layer (Subcutaneous Tissue) exposed. There is no tunneling noted, however, there is undermining starting at 9:00 and ending at 3:00 with a maximum distance of 0.7cm. There is a medium amount of purulent drainage noted. The wound margin is well defined and not attached to the wound base. There is medium (34-66%) pink, pale granulation within the wound bed. There is a medium (34-66%) amount of necrotic tissue within the wound bed including Adherent Slough. Assessment Active Problems ICD-10 Type 2 diabetes mellitus with foot ulcer Type 2 diabetes mellitus with diabetic peripheral angiopathy without gangrene Non-pressure chronic ulcer of other part of left foot limited to breakdown of  skin Procedures Wound #1 Pre-procedure diagnosis of Wound #1 is  a Diabetic Wound/Ulcer of the Lower Extremity located on the Left T Great .Severity of Tissue Pre Debridement is: oe Fat layer exposed. There was a Excisional Skin/Subcutaneous Tissue Debridement with a total area of 0.5 sq cm performed by Ricard Dillon., MD. With the following instrument(s): Blade, and Forceps to remove Viable and Non-Viable tissue/material. Material removed includes Subcutaneous Tissue and Skin: Epidermis and after achieving pain control using Other (benzocaine 20% spray.5). 1 specimen was taken by a Swab and sent to the lab per facility protocol. A time out was conducted at 09:55, prior to the start of the procedure. A Minimum amount of bleeding was controlled with Silver Nitrate. The procedure was tolerated well with a pain level of 0 throughout and a pain level of 0 following the procedure. Post Debridement Measurements: 0.5cm length x 0.4cm width x 0.4cm depth; 0.063cm^3 volume. Character of Wound/Ulcer Post Debridement is improved. Severity of Tissue Post Debridement is: Fat layer exposed. Post procedure Diagnosis Wound #1: Same as Pre-Procedure Plan Follow-up Appointments: Return Appointment in 1 week. Bathing/ Shower/ Hygiene: May shower and wash wound with soap and water. Edema Control - Lymphedema / SCD / Other: Elevate legs to the level of the heart or above for 30 minutes daily and/or when sitting, a frequency of: Avoid standing for long periods of time. Patient to wear own compression stockings every day. - both legs Exercise regularly Moisturize legs daily. Laboratory ordered were: Anerobic culture - left great toe Radiology ordered were: X-ray, foot left complete view - diabetic foot ulcer left great toe, r/o osteomyolitis CPT [phone] The following medication(s) was prescribed: doxycycline monohydrate oral 100 mg capsule 1 capsule oral bid for 7 days for wound infection starting  05/15/2020 WOUND #1: - T Great Wound Laterality: Left oe Cleanser: Soap and Water Every Other Day/15 Days Discharge Instructions: May shower and wash wound with dial antibacterial soap and water prior to dressing change. Prim Dressing: KerraCel Ag Gelling Fiber Dressing, 2x2 in (silver alginate) Every Other Day/15 Days ary Discharge Instructions: Apply silver alginate to wound bed pack lightly into hole. Secondary Dressing: Woven Gauze Sponges 2x2 in Every Other Day/15 Days Discharge Instructions: Apply over primary dressing as directed. Secured With: Child psychotherapist, Sterile 2x75 (in/in) Every Other Day/15 Days Discharge Instructions: Secure with stretch gauze as directed. 1 I put him on doxycycline 100 twice daily while I wait for the culture result 2. Change the primary dressing to silver alginate 3. X-ray of the foot. I compared this with the one in August that certainly did not show osteomyelitis 4. The last creatinine I see was 1.7 with an estimated GFR of 43 I am not sure that this qualifies as stage IV chronic renal insufficiency perhaps stage III. Not sure this would be an absolute contraindication but again I may not have complete information. 5. This will marks a marked deterioration in this wound today. Electronic Signature(s) Signed: 05/15/2020 12:44:20 PM By: Linton Ham MD Entered By: Linton Ham on 05/15/2020 10:15:54 -------------------------------------------------------------------------------- SuperBill Details Patient Name: Date of Service: Micheal Quails D. 05/15/2020 Medical Record Number: [id number] Patient Account Number: [id number] Date of Birth/Sex: Treating RN: 31-Mar-1936 (84 y.o. Micheal Walls Primary Care Provider: [name] Other Clinician: Referring Provider: Treating Provider/Extender: Lynden Ang in Treatment: 6 Diagnosis Coding ICD-10 Codes Code Description [phone] Type 2 diabetes  mellitus with foot ulcer E11.51 Type 2 diabetes mellitus with diabetic peripheral angiopathy without gangrene L97.521 Non-pressure chronic ulcer of other part of left  foot limited to breakdown of skin Facility Procedures CPT4 Code: [id number] Description: 50158 - DEB SUBQ TISSUE 20 SQ CM/< ICD-10 Diagnosis Description L97.521 Non-pressure chronic ulcer of other part of left foot limited to breakdown of Modifier: skin Quantity: 1 Physician Procedures : CPT4 Code Description Modifier [id number] 11042 - WC PHYS SUBQ TISS 20 SQ CM ICD-10 Diagnosis Description L97.521 Non-pressure chronic ulcer of other part of left foot limited to breakdown of skin Quantity: 1 Electronic Signature(s) Signed: 05/15/2020 12:44:20 PM By: Linton Ham MD Entered By: Linton Ham on 05/15/2020 10:16:08

## 2020-05-15 NOTE — Progress Notes (Signed)
FAROUK, VIVERO ([id number]) Visit Report for 05/15/2020 Arrival Information Details Patient Name: Date of Service: Micheal Walls, Micheal Walls 05/15/2020 9:00 A M Medical Record Number: [id number] Patient Account Number: [id number] Date of Birth/Sex: Treating RN: 11-Nov-1935 (84 y.o. Jerilynn Mages) Carlene Coria Primary Care Onyekachi Gathright: [name] Other Clinician: Referring Roni Friberg: Treating Jenesis Martin/Extender: Lynden Ang in Treatment: 6 Visit Information History Since Last Visit All ordered tests and consults were completed: No Patient Arrived: Other Added or deleted any medications: No Arrival Time: 09:26 Any new allergies or adverse reactions: No Accompanied By: self Had a fall or experienced change in No Transfer Assistance: None activities of daily living that may affect Patient Identification Verified: Yes risk of falls: Secondary Verification Process Completed: Yes Signs or symptoms of abuse/neglect since last visito No Patient Requires Transmission-Based Precautions: No Hospitalized since last visit: No Patient Has Alerts: No Implantable device outside of the clinic excluding No cellular tissue based products placed in the center since last visit: Has Dressing in Place as Prescribed: Yes Pain Present Now: No Electronic Signature(s) Signed: 05/15/2020 5:16:12 PM By: Carlene Coria RN Entered By: Carlene Coria on 05/15/2020 09:26:50 -------------------------------------------------------------------------------- Encounter Discharge Information Details Patient Name: Date of Service: Micheal Quails D. 05/15/2020 9:00 A M Medical Record Number: [id number] Patient Account Number: [id number] Date of Birth/Sex: Treating RN: 28-Nov-1935 (84 y.o. Hessie Diener Primary Care Stephonie Wilcoxen: [name] Other Clinician: Referring Shamar Engelmann: Treating Ryett Hamman/Extender: Lynden Ang in Treatment: 6 Encounter Discharge Information Items Post Procedure  Vitals Discharge Condition: Stable Temperature (F): 98.4 Ambulatory Status: Wheelchair Pulse (bpm): 85 Discharge Destination: Home Respiratory Rate (breaths/min): 18 Transportation: Private Auto Blood Pressure (mmHg): 145/69 Accompanied By: self Schedule Follow-up Appointment: Yes Clinical Summary of Care: Electronic Signature(s) Signed: 05/15/2020 5:19:29 PM By: Deon Pilling Entered By: Deon Pilling on 05/15/2020 10:23:24 -------------------------------------------------------------------------------- Lower Extremity Assessment Details Patient Name: Date of Service: Micheal Quails D. 05/15/2020 9:00 A M Medical Record Number: [id number] Patient Account Number: [id number] Date of Birth/Sex: Treating RN: 1936/04/06 (84 y.o. Jerilynn Mages) Carlene Coria Primary Care Finnean Cerami: [name] Other Clinician: Referring Leonce Bale: Treating Adilynne Fitzwater/Extender: Orlie Pollen Weeks in Treatment: 6 Edema Assessment Assessed: Shirlyn Goltz: No] [Right: No] Edema: [Left: Ye] [Right: s] Calf Left: Right: Point of Measurement: 40 cm From Medial Instep 42 cm Ankle Left: Right: Point of Measurement: 13 cm From Medial Instep 34 cm Electronic Signature(s) Signed: 05/15/2020 5:16:12 PM By: Carlene Coria RN Entered By: Carlene Coria on 05/15/2020 09:33:31 -------------------------------------------------------------------------------- Multi Wound Chart Details Patient Name: Date of Service: Micheal Quails D. 05/15/2020 9:00 A M Medical Record Number: [id number] Patient Account Number: [id number] Date of Birth/Sex: Treating RN: Sep 10, 1935 (84 y.o. [name] Primary Care Kyro Joswick: [name] Other Clinician: Referring Nieko Clarin: Treating Dontea Corlew/Extender: Lynden Ang in Treatment: 6 Vital Signs Height(in): 71 Pulse(bpm): 45 Weight(lbs): 275 Blood Pressure(mmHg): 145/69 Body Mass Index(BMI): 36 Temperature(F): 98.4 Respiratory Rate(breaths/min):  18 Photos: [1:No Photos Left T Great oe] [N/A:N/A N/A] Wound Location: [1:Blister] [N/A:N/A] Wounding Event: [1:Diabetic Wound/Ulcer of the Lower] [N/A:N/A] Primary Etiology: [1:Extremity Cataracts, Anemia, Lymphedema,] [N/A:N/A] Comorbid History: [1:Sleep Apnea, Congestive Heart Failure, Hypertension, Peripheral Arterial Disease, Peripheral Venous Disease, Type II Diabetes, End Stage Renal Disease, Gout, Neuropathy 03/27/2020] [N/A:N/A] Date Acquired: [1:6] [N/A:N/A] Weeks of Treatment: [1:Open] [N/A:N/A] Wound Status: [1:0.6x0.4x0.4] [N/A:N/A] Measurements L x W x D (cm) [1:0.188] [N/A:N/A] A (cm) : rea [1:0.075] [N/A:N/A] Volume (cm) : [1:-506.50%] [N/A:N/A] % Reduction in Area: [1:-294.70%] [N/A:N/A] % Reduction in Volume: [1:9]  Starting Position 1 (o'clock): [1:3] Ending Position 1 (o'clock): [1:0.7] Maximum Distance 1 (cm): [1:Yes] [N/A:N/A] Undermining: [1:Grade 2] [N/A:N/A] Classification: [1:Medium] [N/A:N/A] Exudate A mount: [1:Purulent] [N/A:N/A] Exudate Type: [1:yellow, brown, green] [N/A:N/A] Exudate Color: [1:Well defined, not attached] [N/A:N/A] Wound Margin: [1:Medium (34-66%)] [N/A:N/A] Granulation A mount: [1:Pink, Pale] [N/A:N/A] Granulation Quality: [1:Medium (34-66%)] [N/A:N/A] Necrotic A mount: [1:Fat Layer (Subcutaneous Tissue): Yes N/A] Exposed Structures: [1:Fascia: No Tendon: No Muscle: No Joint: No Bone: No Small (1-33%)] [N/A:N/A] Epithelialization: [1:Debridement - Excisional] [N/A:N/A] Debridement: Pre-procedure Verification/Time Out 09:55 [N/A:N/A] Taken: [1:Other] [N/A:N/A] Pain Control: [1:Subcutaneous] [N/A:N/A] Tissue Debrided: [1:Skin/Subcutaneous Tissue] [N/A:N/A] Level: [1:0.5] [N/A:N/A] Debridement A (sq cm): [1:rea Blade, Forceps] [N/A:N/A] Instrument: [1:Swab] [N/A:N/A] Specimen: [1:1] [N/A:N/A] Number of Specimens Taken: [1:Minimum] [N/A:N/A] Bleeding: [1:Silver Nitrate] [N/A:N/A] Hemostasis A chieved: [1:0] [N/A:N/A] Procedural  Pain: [1:0] [N/A:N/A] Post Procedural Pain: [1:Procedure was tolerated well] [N/A:N/A] Debridement Treatment Response: [1:0.5x0.4x0.4] [N/A:N/A] Post Debridement Measurements L x W x D (cm) [1:0.063] [N/A:N/A] Post Debridement Volume: (cm) [1:Debridement] [N/A:N/A] Treatment Notes Electronic Signature(s) Signed: 05/15/2020 12:44:20 PM By: Linton Ham MD Signed: 05/15/2020 5:01:53 PM By: Baruch Gouty RN, BSN Entered By: Linton Ham on 05/15/2020 10:07:22 -------------------------------------------------------------------------------- Multi-Disciplinary Care Plan Details Patient Name: Date of Service: Micheal Quails D. 05/15/2020 9:00 A M Medical Record Number: [id number] Patient Account Number: [id number] Date of Birth/Sex: Treating RN: 03/13/1936 (84 y.o. [name] Primary Care Ross Bender: [name] Other Clinician: Referring Truitt Cruey: Treating Akua Blethen/Extender: Lynden Ang in Treatment: 6 Active Inactive Nutrition Nursing Diagnoses: Impaired glucose control: actual or potential Potential for alteratiion in Nutrition/Potential for imbalanced nutrition Goals: Patient/caregiver will maintain therapeutic glucose control Date Initiated: 04/03/2020 Target Resolution Date: 05/29/2020 Goal Status: Active Interventions: Assess HgA1c results as ordered upon admission and as needed Provide education on elevated blood sugars and impact on wound healing Treatment Activities: Patient referred to Primary Care Physician for further nutritional evaluation : 04/03/2020 Notes: Wound/Skin Impairment Nursing Diagnoses: Impaired tissue integrity Knowledge deficit related to ulceration/compromised skin integrity Goals: Patient/caregiver will verbalize understanding of skin care regimen Date Initiated: 04/03/2020 Target Resolution Date: 05/29/2020 Goal Status: Active Ulcer/skin breakdown will have a volume reduction of 50% by week 8 Date  Initiated: 04/03/2020 Date Inactivated: 05/15/2020 Target Resolution Date: 05/01/2020 Goal Status: Unmet Unmet Reason: offloading issue Ulcer/skin breakdown will have a volume reduction of 80% by week 12 Date Initiated: 05/15/2020 Target Resolution Date: 05/29/2020 Goal Status: Active Interventions: Assess patient/caregiver ability to obtain necessary supplies Assess patient/caregiver ability to perform ulcer/skin care regimen upon admission and as needed Assess ulceration(s) every visit Provide education on ulcer and skin care Treatment Activities: Skin care regimen initiated : 04/03/2020 Topical wound management initiated : 04/03/2020 Notes: Electronic Signature(s) Signed: 05/15/2020 5:01:53 PM By: Baruch Gouty RN, BSN Entered By: Baruch Gouty on 05/15/2020 09:41:05 -------------------------------------------------------------------------------- Pain Assessment Details Patient Name: Date of Service: Micheal Quails D. 05/15/2020 9:00 A M Medical Record Number: [id number] Patient Account Number: [id number] Date of Birth/Sex: Treating RN: 21-Jan-1936 (84 y.o. Oval Linsey Primary Care Zafira Munos: [name] Other Clinician: Referring Buel Molder: Treating Audine Mangione/Extender: Orlie Pollen Weeks in Treatment: 6 Active Problems Location of Pain Severity and Description of Pain Patient Has Paino No Site Locations Pain Management and Medication Current Pain Management: Electronic Signature(s) Signed: 05/15/2020 5:16:12 PM By: Carlene Coria RN Entered By: Carlene Coria on 05/15/2020 09:31:10 -------------------------------------------------------------------------------- Patient/Caregiver Education Details Patient Name: Date of Service: Micheal Walls 12/3/2021andnbsp9:00 [place] Record Number: [id number] Patient Account Number: [id number] Date of Birth/Gender:  Treating RN: [birth year] (84 y.o. [name] Primary Care Physician: [name] Other Clinician: Referring Physician: Treating Physician/Extender: Lynden Ang in Treatment: 6 Education Assessment Education Provided To: Patient Education Topics Provided Elevated Blood Sugar/ Impact on Healing: Methods: Explain/Verbal Responses: Reinforcements needed, State content correctly Infection: Methods: Explain/Verbal Responses: Reinforcements needed, State content correctly Wound/Skin Impairment: Methods: Explain/Verbal Responses: Reinforcements needed, State content correctly Electronic Signature(s) Signed: 05/15/2020 5:01:53 PM By: Baruch Gouty RN, BSN Entered By: Baruch Gouty on 05/15/2020 09:56:03 -------------------------------------------------------------------------------- Wound Assessment Details Patient Name: Date of Service: Micheal Quails D. 05/15/2020 9:00 A M Medical Record Number: [id number] Patient Account Number: [id number] Date of Birth/Sex: Treating RN: 01-23-1936 (84 y.o. Jerilynn Mages) Carlene Coria Primary Care Ege Muckey: [name] Other Clinician: Referring Taelon Bendorf: Treating Amee Boothe/Extender: Lynden Ang in Treatment: 6 Wound Status Wound Number: 1 Primary Diabetic Wound/Ulcer of the Lower Extremity Etiology: Wound Location: Left T Great oe Wound Open Wounding Event: Blister Status: Date Acquired: 03/27/2020 Comorbid Cataracts, Anemia, Lymphedema, Sleep Apnea, Congestive Heart Weeks Of Treatment: 6 History: Failure, Hypertension, Peripheral Arterial Disease, Peripheral Venous Clustered Wound: No Disease, Type II Diabetes, End Stage Renal Disease, Gout, Neuropathy Wound Measurements Length: (cm) 0.6 Width: (cm) 0.4 Depth: (cm) 0.4 Area: (cm) 0.188 Volume: (cm) 0.075 % Reduction in Area: -506.5% % Reduction in Volume: -294.7% Epithelialization: Small (1-33%) Tunneling: No Undermining: Yes Starting Position (o'clock): 9 Ending Position (o'clock): 3 Maximum Distance:  (cm) 0.7 Wound Description Classification: Grade 2 Wound Margin: Well defined, not attached Exudate Amount: Medium Exudate Type: Purulent Exudate Color: yellow, brown, green Foul Odor After Cleansing: No Slough/Fibrino Yes Wound Bed Granulation Amount: Medium (34-66%) Exposed Structure Granulation Quality: Pink, Pale Fascia Exposed: No Necrotic Amount: Medium (34-66%) Fat Layer (Subcutaneous Tissue) Exposed: Yes Necrotic Quality: Adherent Slough Tendon Exposed: No Muscle Exposed: No Joint Exposed: No Bone Exposed: No Treatment Notes Wound #1 (Toe Great) Wound Laterality: Left Cleanser Soap and Water Discharge Instruction: May shower and wash wound with dial antibacterial soap and water prior to dressing change. Peri-Wound Care Topical Primary Dressing KerraCel Ag Gelling Fiber Dressing, 2x2 in (silver alginate) Discharge Instruction: Apply silver alginate to wound bed pack lightly into hole. Secondary Dressing Woven Gauze Sponges 2x2 in Discharge Instruction: Apply over primary dressing as directed. Secured With Conforming Stretch Gauze Bandage, Sterile 2x75 (in/in) Discharge Instruction: Secure with stretch gauze as directed. Compression Wrap Compression Stockings Add-Ons Electronic Signature(s) Signed: 05/15/2020 5:16:12 PM By: Carlene Coria RN Entered By: Carlene Coria on 05/15/2020 09:33:18 -------------------------------------------------------------------------------- Vitals Details Patient Name: Date of Service: Micheal Quails D. 05/15/2020 9:00 A M Medical Record Number: [id number] Patient Account Number: [id number] Date of Birth/Sex: Treating RN: 07/03/1935 (84 y.o. Jerilynn Mages) Carlene Coria Primary Care Kebrina Friend: [name] Other Clinician: Referring Lynnmarie Lovett: Treating Keelyn Monjaras/Extender: Lynden Ang in Treatment: 6 Vital Signs Time Taken: 09:26 Temperature (F): 98.4 Height (in): 73 Pulse (bpm): 85 Weight (lbs): 275 Respiratory  Rate (breaths/min): 18 Body Mass Index (BMI): 36.3 Blood Pressure (mmHg): 145/69 Reference Range: 80 - 120 mg / dl Electronic Signature(s) Signed: 05/15/2020 5:16:12 PM By: Carlene Coria RN Entered By: Carlene Coria on 05/15/2020 09:31:03

## 2020-05-19 LAB — AEROBIC CULTURE W GRAM STAIN (SUPERFICIAL SPECIMEN): Gram Stain: NONE SEEN

## 2020-05-20 ENCOUNTER — Other Ambulatory Visit: Payer: Self-pay

## 2020-05-20 ENCOUNTER — Ambulatory Visit (INDEPENDENT_AMBULATORY_CARE_PROVIDER_SITE_OTHER): Payer: Medicare Other

## 2020-05-20 ENCOUNTER — Ambulatory Visit (INDEPENDENT_AMBULATORY_CARE_PROVIDER_SITE_OTHER): Payer: Medicare Other | Admitting: Nurse Practitioner

## 2020-05-20 ENCOUNTER — Encounter: Payer: Self-pay | Admitting: Nurse Practitioner

## 2020-05-20 VITALS — BP 130/72 | HR 81 | Temp 97.8°F

## 2020-05-20 DIAGNOSIS — H9193 Unspecified hearing loss, bilateral: Secondary | ICD-10-CM

## 2020-05-20 DIAGNOSIS — E1159 Type 2 diabetes mellitus with other circulatory complications: Secondary | ICD-10-CM | POA: Diagnosis not present

## 2020-05-20 DIAGNOSIS — S91102D Unspecified open wound of left great toe without damage to nail, subsequent encounter: Secondary | ICD-10-CM | POA: Diagnosis not present

## 2020-05-20 DIAGNOSIS — Z974 Presence of external hearing-aid: Secondary | ICD-10-CM | POA: Diagnosis not present

## 2020-05-20 DIAGNOSIS — Z23 Encounter for immunization: Secondary | ICD-10-CM

## 2020-05-20 DIAGNOSIS — I129 Hypertensive chronic kidney disease with stage 1 through stage 4 chronic kidney disease, or unspecified chronic kidney disease: Secondary | ICD-10-CM | POA: Diagnosis not present

## 2020-05-20 DIAGNOSIS — Z0000 Encounter for general adult medical examination without abnormal findings: Secondary | ICD-10-CM | POA: Diagnosis not present

## 2020-05-20 DIAGNOSIS — E782 Mixed hyperlipidemia: Secondary | ICD-10-CM | POA: Diagnosis not present

## 2020-05-20 DIAGNOSIS — Z794 Long term (current) use of insulin: Secondary | ICD-10-CM | POA: Diagnosis not present

## 2020-05-20 DIAGNOSIS — I739 Peripheral vascular disease, unspecified: Secondary | ICD-10-CM

## 2020-05-20 DIAGNOSIS — N184 Chronic kidney disease, stage 4 (severe): Secondary | ICD-10-CM

## 2020-05-20 LAB — CMP14+EGFR
ALT: 16 IU/L (ref 0–44)
AST: 18 IU/L (ref 0–40)
Albumin/Globulin Ratio: 1.3 (ref 1.2–2.2)
Albumin: 3.9 g/dL (ref 3.6–4.6)
Alkaline Phosphatase: 75 IU/L (ref 44–121)
BUN/Creatinine Ratio: 29 — ABNORMAL HIGH (ref 10–24)
BUN: 50 mg/dL — ABNORMAL HIGH (ref 8–27)
Bilirubin Total: 0.5 mg/dL (ref 0.0–1.2)
CO2: 22 mmol/L (ref 20–29)
Calcium: 10.7 mg/dL — ABNORMAL HIGH (ref 8.6–10.2)
Chloride: 104 mmol/L (ref 96–106)
Creatinine, Ser: 1.73 mg/dL — ABNORMAL HIGH (ref 0.76–1.27)
GFR calc Af Amer: 42 mL/min/{1.73_m2} — ABNORMAL LOW (ref >59)
GFR calc non Af Amer: 36 mL/min/{1.73_m2} — ABNORMAL LOW (ref >59)
Globulin, Total: 3 g/dL (ref 1.5–4.5)
Glucose: 113 mg/dL — ABNORMAL HIGH (ref 65–99)
Potassium: 4 mmol/L (ref 3.5–5.2)
Sodium: 140 mmol/L (ref 134–144)
Total Protein: 6.9 g/dL (ref 6.0–8.5)

## 2020-05-20 LAB — LIPID PANEL
Chol/HDL Ratio: 3.2 ratio (ref 0.0–5.0)
Cholesterol, Total: 129 mg/dL (ref 100–199)
HDL: 40 mg/dL (ref >39)
LDL Chol Calc (NIH): 70 mg/dL (ref 0–99)
Triglycerides: 102 mg/dL (ref 0–149)
VLDL Cholesterol Cal: 19 mg/dL (ref 5–40)

## 2020-05-20 LAB — HEMOGLOBIN A1C
Est. average glucose Bld gHb Est-mCnc: 131 mg/dL
Hgb A1c MFr Bld: 6.2 % — ABNORMAL HIGH (ref 4.8–5.6)

## 2020-05-20 NOTE — Progress Notes (Signed)
This visit occurred during the SARS-CoV-2 public health emergency.  Safety protocols were in place, including screening questions prior to the visit, additional usage of staff PPE, and extensive cleaning of exam room while observing appropriate contact time as indicated for disinfecting solutions.  Subjective:   Micheal Walls is a 84 y.o. male who presents for Medicare Annual/Subsequent preventive examination.  Review of Systems     Cardiac Risk Factors include: advanced age (>18men, >35 women);diabetes mellitus;male gender;sedentary lifestyle     Objective:    Today's Vitals   05/20/20 0902  BP: 130/72  Pulse: 81  Temp: 97.8 F (36.6 C)  TempSrc: Oral   There is no height or weight on file to calculate BMI.  Advanced Directives 05/20/2020 01/17/2020 11/02/2019 05/15/2019 02/06/2019 10/10/2018 03/15/2018  Does Patient Have a Medical Advance Directive? Yes No No No No No Yes  Type of Paramedic of [place];Living will - - - - - Living will  Does patient want to make changes to medical advance directive? - - - - - - No - Patient declined  Copy of [place] in Chart? No - copy requested - - - - - -  Would patient like information on creating a medical advance directive? - No - Patient declined Yes (ED - Information included in AVS) - No - Patient declined No - Patient declined -    Current Medications (verified) Outpatient Encounter Medications as of 05/20/2020  Medication Sig  . allopurinol (ZYLOPRIM) 100 MG tablet Take 1 tablet (100 mg total) by mouth daily. (Patient taking differently: Take 100 mg by mouth 2 (two) times daily. )  . antiseptic oral rinse (BIOTENE) LIQD 15 mLs by Mouth Rinse route as needed for dry mouth.  Marland Kitchen aspirin 81 MG tablet Take 162 mg by mouth daily.  . benzonatate (TESSALON PERLES) 100 MG capsule Take 1 capsule (100 mg total) by mouth every 6 (six) hours as needed.  [name] USE A COTTON  TIP APPLICATOR- PAINT BETWEEN TOES OF LEFT FOOT ONCE A DAY AS NEEDED  . colchicine 0.6 MG tablet Take 0.6 mg by mouth daily.   . Continuous Blood Gluc Sensor (FREESTYLE LIBRE 14 DAY SENSOR) MISC Use as directed to check blood sugars  . diclofenac Sodium (VOLTAREN) 1 % GEL Apply 2 g topically 4 (four) times daily.  Marland Kitchen diltiazem (CARDIZEM CD) 300 MG 24 hr capsule Take 1 capsule (300 mg total) by mouth daily.  . dorzolamide (TRUSOPT) 2 % ophthalmic solution Place 1 drop into both eyes 2 (two) times daily.  . ferrous sulfate 325 (65 FE) MG tablet Take 325 mg by mouth daily with breakfast.  . fish oil-omega-3 fatty acids 1000 MG capsule Take 1 g by mouth 2 (two) times daily.   . furosemide (LASIX) 20 MG tablet Take 1 tab midday for 3 days only.  . furosemide (LASIX) 40 MG tablet Take 2 tabs in morning and 1 tab evening  . gabapentin (NEURONTIN) 100 MG capsule Take 1 capsule (100 mg total) by mouth 3 (three) times daily.  Marland Kitchen glucose blood (FREESTYLE PRECISION NEO TEST) test strip Use as instructed  . Glycerin-Polysorbate 80 (REFRESH DRY EYE THERAPY OP) Place 1 drop into both eyes in the morning and at bedtime.  . hydrocortisone (ANUSOL-HC) 25 MG suppository Place 25 mg rectally 2 (two) times daily as needed for hemorrhoids or anal itching.  . Insulin Degludec (TRESIBA FLEXTOUCH [place]) Inject 10 mg into the skin  at bedtime.  Marland Kitchen ketotifen (ALAWAY) 0.025 % ophthalmic solution Place 1 drop into both eyes 2 (two) times daily.  Marland Kitchen latanoprost (XALATAN) 0.005 % ophthalmic solution Place 1 drop into both eyes at bedtime.   Marland Kitchen losartan (COZAAR) 100 MG tablet Take 0.5 tablets (50 mg total) by mouth at bedtime.  . Multiple Vitamin (MULTIVITAMIN) tablet Take 1 tablet by mouth daily.  . niacin (NIASPAN) 500 MG CR tablet TAKE 2 TABLETS AT BEDTIME  . pantoprazole (PROTONIX) 40 MG tablet Take 1 tablet  by mouth daily. (Patient taking differently: Take 40 mg by mouth daily. )  . pramoxine-hydrocortisone (PRAMOSONE) cream  Apply topically 3 (three) times daily. Apply topically to hemorrhoids four times daily as needed after bowel movements, for external use only  . PRESCRIPTION MEDICATION See admin instructions. CPAP- At bedtime  . rosuvastatin (CRESTOR) 10 MG tablet Take 1 tablet (10 mg total) by mouth daily. (Patient taking differently: Take 10 mg by mouth once a week. )  . Semaglutide, [name], (OZEMPIC, [name],) 2 MG/1.5ML SOPN Inject 1 mg into the skin once a week.   No facility-administered encounter medications on file as of 05/20/2020.    Allergies (verified) Patient has no known allergies.   History: Past Medical History:  Diagnosis Date  . Acute metabolic encephalopathy 01/11/8562  . 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 Anemia   . Aspiration pneumonia ([place]) 11/02/2019  . Diastolic heart failure ([place])   . Diverticulosis   . DM (diabetes mellitus) ([place])   . Gout   . Hyperlipemia   . OSA on CPAP   . RBBB   . Sepsis ([place]) 11/02/2019  . Systemic hypertension    Past Surgical History:  Procedure Laterality Date  . LEG AMPUTATION BELOW KNEE     right  . NM MYOCAR PERF WALL MOTION  09/18/2009   no ischemia   Family History  Problem Relation Age of Onset  . Diabetes Mother   . Heart attack Father   . Cancer Sister    Social History   Socioeconomic History  . Marital status: Married    Spouse name: Not on file  . Number of children: Not on file  . Years of education: Not on file  . Highest education level: Not on file  Occupational History  . Occupation: retired  Tobacco Use  . Smoking status: Former Smoker    Types: Cigars    Quit date: 06/12/2001    Years since quitting: 18.9  . Smokeless tobacco: Never Used  Vaping Use  . Vaping Use: Former  Substance and Sexual Activity  . Alcohol use: No    Alcohol/week: 0.0 standard drinks  . Drug use: No  . Sexual activity: Not Currently  Other Topics Concern  . Not on file  Social History Narrative  . Not on file   Social  Determinants of Health   Financial Resource Strain: Low Risk   . Difficulty of Paying Living Expenses: Not hard at all  Food Insecurity: No Food Insecurity  . Worried About Charity fundraiser in the Last Year: Never true  . Ran Out of Food in the Last Year: Never true  Transportation Needs: No Transportation Needs  . Lack of Transportation (Medical): No  . Lack of Transportation (Non-Medical): No  Physical Activity: Inactive  . Days of Exercise per Week: 0 days  . Minutes of Exercise per Session: 0 min  Stress: No Stress Concern Present  . Feeling of Stress :  Not at all  Social Connections:   . Frequency of Communication with Friends and Family: Not on file  . Frequency of Social Gatherings with Friends and Family: Not on file  . Attends Religious Services: Not on file  . Active Member of Clubs or Organizations: Not on file  . Attends Archivist Meetings: Not on file  . Marital Status: Not on file    Tobacco Counseling Counseling given: Not Answered   Clinical Intake:  Pre-visit preparation completed: Yes  Pain : No/denies pain     Nutritional Risks: None Diabetes: Yes  How often do you need to have someone help you when you read instructions, pamphlets, or other written materials from your doctor or pharmacy?: 1 - Never What is the last grade level you completed in school?: 12th grade  Diabetic? Yes Nutrition Risk Assessment:  Has the patient had any N/V/D within the last 2 months?  No  Does the patient have any non-healing wounds?  Yes  Has the patient had any unintentional weight loss or weight gain?  Yes   Diabetes:  Is the patient diabetic?  Yes  If diabetic, was a CBG obtained today?  No  Did the patient bring in their glucometer from home?  No  How often do you monitor your CBG's? twice daily.   Financial Strains and Diabetes Management:  Are you having any financial strains with the device, your supplies or your medication? No .  Does the  patient want to be seen by Chronic Care Management for management of their diabetes?  No  Would the patient like to be referred to a Nutritionist or for Diabetic Management?  No   Diabetic Exams:  Diabetic Eye Exam: Completed 01/08/2020 Diabetic Foot Exam: Completed 09/24/2019   Interpreter Needed?: No  Information entered by :: NAllen LPN   Activities of Daily Living In your present state of health, do you have any difficulty performing the following activities: 05/20/2020 01/17/2020  Hearing? Y Y  Comment hearing aide in right ear -  Vision? Y N  Comment some blurriness -  Difficulty concentrating or making decisions? N N  Walking or climbing stairs? Y Y  Dressing or bathing? N N  Doing errands, shopping? Aggie Moats  Comment grand son shops -  Conservation officer, nature and eating ? N -  Using the Toilet? N -  In the past six months, have you accidently leaked urine? N -  Do you have problems with loss of bowel control? N -  Managing your Medications? N -  Managing your Finances? N -  Housekeeping or managing your Housekeeping? N -  Some recent data might be hidden    Patient Care Team: [name], [name] as PCP - General ([place]) [name], [name], MD as PCP - Cardiology (Cardiology)  Indicate any recent Medical Services you may have received from other than [facility] providers in the past year (date may be approximate).     Assessment:   This is a routine wellness examination for [place].  Hearing/Vision screen  Hearing Screening   '125Hz'$  $Remo'250Hz'mLvcX$'500Hz'$'1000Hz'$'2000Hz'$'3000Hz'$'4000Hz'$'6000Hz'$'8000Hz'$   Right ear:           Left ear:           Vision Screening Comments: Regular eye exams, Dr. Zadie Rhine, Dr. Herbert Deaner  Dietary issues and exercise activities discussed: Current Exercise Habits: The patient does not participate in regular exercise at present  Goals    . Patient Stated  05/15/2019, no goals    . Patient Stated     05/20/2020, no goals      Depression Screen PHQ 2/9 Scores  05/20/2020 09/24/2019 06/25/2019 05/15/2019 03/21/2019 12/17/2018 09/17/2018  PHQ - 2 Score 0 0 0 0 0 0 0  PHQ- 9 Score - - - 3 - - -    Fall Risk Fall Risk  05/20/2020 09/24/2019 06/25/2019 05/15/2019 03/21/2019  Falls in the past year? 1 0 0 0 0  Comment had no energy - - - -  Number falls in past yr: 0 - - - -  Injury with Fall? 1 - - - -  Risk for fall due to : Impaired mobility;Medication side effect - - Medication side effect;Impaired mobility;Impaired balance/gait -  Follow up Falls evaluation completed;Education provided;Falls prevention discussed - - - -    FALL RISK PREVENTION PERTAINING TO THE HOME:  Any stairs in or around the home? No  If so, are there any without handrails? n/a Home free of loose throw rugs in walkways, pet beds, electrical cords, etc? Yes  Adequate lighting in your home to reduce risk of falls? Yes   ASSISTIVE DEVICES UTILIZED TO PREVENT FALLS:  Life alert? No  Use of a cane, walker or w/c? Yes  Grab bars in the bathroom? Yes  Shower chair or bench in shower? Yes  Elevated toilet seat or a handicapped toilet? Yes   TIMED UP AND GO:  Was the test performed? No .     Cognitive Function:     6CIT Screen 05/20/2020 05/15/2019 03/15/2018  What Year? 0 points 0 points 0 points  What month? 0 points 0 points 0 points  What time? 0 points 0 points 0 points  Count back from 20 2 points 0 points 0 points  Months in reverse 0 points 0 points 0 points  Repeat phrase 0 points 2 points 2 points  Total Score $RemoveBef'2 2 2    'CodFqHvSLe$ Immunizations Immunization History  Administered Date(s) Administered  . Influenza, High Dose Seasonal PF 03/21/2019  . PFIZER SARS-COV-2 Vaccination 08/31/2019, 09/25/2019  . Pneumococcal Conjugate-13 02/23/2016  . Pneumococcal Polysaccharide-23 05/15/2019  . Tdap 02/04/2013    TDAP status: Up to date  Flu Vaccine status: Completed at today's visit  Pneumococcal vaccine status: Up to date  Covid-19 vaccine status: Completed  vaccines  Qualifies for Shingles Vaccine? Yes   Zostavax completed Yes   Shingrix Completed?: No.    Education has been provided regarding the importance of this vaccine. Patient has been advised to call insurance company to determine out of pocket expense if they have not yet received this vaccine. Advised may also receive vaccine at local pharmacy or Health Dept. Verbalized acceptance and understanding.  Screening Tests Health Maintenance  Topic Date Due  . INFLUENZA VACCINE  01/12/2020  . HEMOGLOBIN A1C  07/25/2020  . FOOT EXAM  09/23/2020  . OPHTHALMOLOGY EXAM  01/07/2021  . TETANUS/TDAP  02/05/2023  . COVID-19 Vaccine  Completed  . PNA vac Low Risk Adult  Completed    Health Maintenance  Health Maintenance Due  Topic Date Due  . INFLUENZA VACCINE  01/12/2020    Colorectal cancer screening: No longer required.   Lung Cancer Screening: (Low Dose CT Chest recommended if Age 43-80 years, 30 pack-year currently smoking OR have quit w/in 15years.) does not qualify.   Lung Cancer Screening Referral: no  Additional Screening:  Hepatitis C Screening: does not qualify;  Vision Screening: Recommended annual ophthalmology exams for early  detection of glaucoma and other disorders of the eye. Is the patient up to date with their annual eye exam?  Yes  Who is the provider or what is the name of the office in which the patient attends annual eye exams? Dr. Zadie Rhine, Dr. Herbert Deaner If pt is not established with a provider, would they like to be referred to a provider to establish care? No .   Dental Screening: Recommended annual dental exams for proper oral hygiene  Community Resource Referral / Chronic Care Management: CRR required this visit?  No   CCM required this visit?  No      Plan:     I have personally reviewed and noted the following in the patient's chart:   . Medical and social history . Use of alcohol, tobacco or illicit drugs  . Current medications and  supplements . Functional ability and status . Nutritional status . Physical activity . Advanced directives . List of other physicians . Hospitalizations, surgeries, and ER visits in previous 12 months . Vitals . Screenings to include cognitive, depression, and falls . Referrals and appointments  In addition, I have reviewed and discussed with patient certain preventive protocols, quality metrics, and best practice recommendations. A written personalized care plan for preventive services as well as general preventive health recommendations were provided to patient.     Kellie Simmering, LPN   [id number]   Nurse Notes:

## 2020-05-20 NOTE — Patient Instructions (Signed)
[name] , Thank you for taking time to come for your Medicare Wellness Visit. I appreciate your ongoing commitment to your health goals. Please review the following plan we discussed and let me know if I can assist you in the future.   Screening recommendations/referrals: Colonoscopy: not required Recommended yearly ophthalmology/optometry visit for glaucoma screening and checkup Recommended yearly dental visit for hygiene and checkup  Vaccinations: Influenza vaccine: today Pneumococcal vaccine: completed 05/15/2019 Tdap vaccine: completed 02/04/2013 Shingles vaccine: discussed   Covid-19:  09/25/2019, 08/31/2019  Advanced directives: Please bring a copy of your POA (Power of Attorney) and/or Living Will to your next appointment.   Conditions/risks identified: none  Next appointment: Follow up in one year for your annual wellness visit.   Preventive Care 82 Years and Older, Male Preventive care refers to lifestyle choices and visits with your health care provider that can promote health and wellness. What does preventive care include?  A yearly physical exam. This is also called an annual well check.  Dental exams once or twice a year.  Routine eye exams. Ask your health care provider how often you should have your eyes checked.  Personal lifestyle choices, including:  Daily care of your teeth and gums.  Regular physical activity.  Eating a healthy diet.  Avoiding tobacco and drug use.  Limiting alcohol use.  Practicing safe sex.  Taking low doses of aspirin every day.  Taking vitamin and mineral supplements as recommended by your health care provider. What happens during an annual well check? The services and screenings done by your health care provider during your annual well check will depend on your age, overall health, lifestyle risk factors, and family history of disease. Counseling  Your health care provider may ask you questions about your:  Alcohol  use.  Tobacco use.  Drug use.  Emotional well-being.  Home and relationship well-being.  Sexual activity.  Eating habits.  History of falls.  Memory and ability to understand (cognition).  Work and work Statistician. Screening  You may have the following tests or measurements:  Height, weight, and BMI.  Blood pressure.  Lipid and cholesterol levels. These may be checked every 5 years, or more frequently if you are over 82 years old.  Skin check.  Lung cancer screening. You may have this screening every year starting at age 82 if you have a 30-pack-year history of smoking and currently smoke or have quit within the past 15 years.  Fecal occult blood test (FOBT) of the stool. You may have this test every year starting at age 5.  Flexible sigmoidoscopy or colonoscopy. You may have a sigmoidoscopy every 5 years or a colonoscopy every 10 years starting at age 82.  Prostate cancer screening. Recommendations will vary depending on your family history and other risks.  Hepatitis C blood test.  Hepatitis B blood test.  Sexually transmitted disease (STD) testing.  Diabetes screening. This is done by checking your blood sugar (glucose) after you have not eaten for a while (fasting). You may have this done every 1-3 years.  Abdominal aortic aneurysm (AAA) screening. You may need this if you are a current or former smoker.  Osteoporosis. You may be screened starting at age 6 if you are at high risk. Talk with your health care provider about your test results, treatment options, and if necessary, the need for more tests. Vaccines  Your health care provider may recommend certain vaccines, such as:  Influenza vaccine. This is recommended every year.  Tetanus,  diphtheria, and acellular pertussis (Tdap, Td) vaccine. You may need a Td booster every 10 years.  Zoster vaccine. You may need this after age 82.  Pneumococcal 13-valent conjugate (PCV13) vaccine. One dose is  recommended after age 82.  Pneumococcal polysaccharide (PPSV23) vaccine. One dose is recommended after age 82. Talk to your health care provider about which screenings and vaccines you need and how often you need them. This information is not intended to replace advice given to you by your health care provider. Make sure you discuss any questions you have with your health care provider. Document Released: 06/26/2015 Document Revised: 02/17/2016 Document Reviewed: 03/31/2015 Elsevier Interactive Patient Education  2017 [place] Prevention in the Home Falls can cause injuries. They can happen to people of all ages. There are many things you can do to make your home safe and to help prevent falls. What can I do on the outside of my home?  Regularly fix the edges of walkways and driveways and fix any cracks.  Remove anything that might make you trip as you walk through a door, such as a raised step or threshold.  Trim any bushes or trees on the path to your home.  Use bright outdoor lighting.  Clear any walking paths of anything that might make someone trip, such as rocks or tools.  Regularly check to see if handrails are loose or broken. Make sure that both sides of any steps have handrails.  Any raised decks and porches should have guardrails on the edges.  Have any leaves, snow, or ice cleared regularly.  Use sand or salt on walking paths during winter.  Clean up any spills in your garage right away. This includes oil or grease spills. What can I do in the bathroom?  Use night lights.  Install grab bars by the toilet and in the tub and shower. Do not use towel bars as grab bars.  Use non-skid mats or decals in the tub or shower.  If you need to sit down in the shower, use a plastic, non-slip stool.  Keep the floor dry. Clean up any water that spills on the floor as soon as it happens.  Remove soap buildup in the tub or shower regularly.  Attach bath mats  securely with double-sided non-slip rug tape.  Do not have throw rugs and other things on the floor that can make you trip. What can I do in the bedroom?  Use night lights.  Make sure that you have a light by your bed that is easy to reach.  Do not use any sheets or blankets that are too big for your bed. They should not hang down onto the floor.  Have a firm chair that has side arms. You can use this for support while you get dressed.  Do not have throw rugs and other things on the floor that can make you trip. What can I do in the kitchen?  Clean up any spills right away.  Avoid walking on wet floors.  Keep items that you use a lot in easy-to-reach places.  If you need to reach something above you, use a strong step stool that has a grab bar.  Keep electrical cords out of the way.  Do not use floor polish or wax that makes floors slippery. If you must use wax, use non-skid floor wax.  Do not have throw rugs and other things on the floor that can make you trip. What can I do with  my stairs?  Do not leave any items on the stairs.  Make sure that there are handrails on both sides of the stairs and use them. Fix handrails that are broken or loose. Make sure that handrails are as long as the stairways.  Check any carpeting to make sure that it is firmly attached to the stairs. Fix any carpet that is loose or worn.  Avoid having throw rugs at the top or bottom of the stairs. If you do have throw rugs, attach them to the floor with carpet tape.  Make sure that you have a light switch at the top of the stairs and the bottom of the stairs. If you do not have them, ask someone to add them for you. What else can I do to help prevent falls?  Wear shoes that:  Do not have high heels.  Have rubber bottoms.  Are comfortable and fit you well.  Are closed at the toe. Do not wear sandals.  If you use a stepladder:  Make sure that it is fully opened. Do not climb a closed  stepladder.  Make sure that both sides of the stepladder are locked into place.  Ask someone to hold it for you, if possible.  Clearly mark and make sure that you can see:  Any grab bars or handrails.  First and last steps.  Where the edge of each step is.  Use tools that help you move around (mobility aids) if they are needed. These include:  Canes.  Walkers.  Scooters.  Crutches.  Turn on the lights when you go into a dark area. Replace any light bulbs as soon as they burn out.  Set up your furniture so you have a clear path. Avoid moving your furniture around.  If any of your floors are uneven, fix them.  If there are any pets around you, be aware of where they are.  Review your medicines with your doctor. Some medicines can make you feel dizzy. This can increase your chance of falling. Ask your doctor what other things that you can do to help prevent falls. This information is not intended to replace advice given to you by your health care provider. Make sure you discuss any questions you have with your health care provider. Document Released: 03/26/2009 Document Revised: 11/05/2015 Document Reviewed: 07/04/2014 Elsevier Interactive Patient Education  2017 [employer].

## 2020-05-20 NOTE — Progress Notes (Signed)
I,Yamilka Roman [employer] as a Education administrator for [employer], [name].,have documented all relevant documentation on the behalf of [name], [name],as directed by  [name], [name] while in the presence of [name], [place]. This visit occurred during the SARS-CoV-2 public health emergency.  Safety protocols were in place, including screening questions prior to the visit, additional usage of staff PPE, and extensive cleaning of exam room while observing appropriate contact time as indicated for disinfecting solutions.  Subjective:     Patient ID: Micheal Walls , male    DOB: 1936/03/19 , 84 y.o.   MRN: [id number]   Chief Complaint  Patient presents with  . Diabetes  . Hypertension    HPI  Patient here for a f/u on his blood pressure and diabetes. Reports his blood sugars have been doing good. He is currently being treated for a wound to his left foot with doxycycline last Friday. He is going once a week for treatment.   Diabetes He presents for his follow-up diabetic visit. He has type 2 diabetes mellitus. His disease course has been improving. Pertinent negatives for hypoglycemia include no confusion, dizziness, headaches or nervousness/anxiousness. There are no diabetic associated symptoms. Pertinent negatives for diabetes include no blurred vision, no fatigue, no polydipsia, no polyphagia and no polyuria. There are no hypoglycemic complications. Symptoms are improving. There are no diabetic complications. Risk factors for coronary artery disease include sedentary lifestyle and obesity. Current diabetic treatment includes oral agent (monotherapy) (no longer on Tresiba since December). He is compliant with treatment all of the time. His weight is stable. He is following a diabetic diet. When asked about meal planning, he reported none. He has not had a previous visit with a dietitian. He rarely participates in exercise. His home blood glucose trend is decreasing steadily. (Blood sugar was 103  this morning.  After review of his meter ranges 92-141.  ) An ACE inhibitor/angiotensin II receptor blocker is being taken. He sees a podiatrist (Dr Adah Perl).Eye exam is current (Dr. Radene Ou - 01/01/2020).  Hypertension This is a chronic problem. The current episode started more than 1 year ago. The problem is unchanged. The problem is uncontrolled. Associated symptoms include peripheral edema (left lower extremity). Pertinent negatives include no anxiety, blurred vision or headaches. There are no associated agents to hypertension. Risk factors for coronary artery disease include obesity and sedentary lifestyle. Past treatments include ACE inhibitors. There are no compliance problems.  Hypertensive end-organ damage includes kidney disease. There is no history of angina. Identifiable causes of hypertension include chronic renal disease.     Past Medical History:  Diagnosis Date  . Acute metabolic encephalopathy 07/18/537  . 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 Anemia   . Aspiration pneumonia ([place]) 11/02/2019  . Diastolic heart failure ([place])   . Diverticulosis   . DM (diabetes mellitus) ([place])   . Gout   . Hyperlipemia   . OSA on CPAP   . RBBB   . Sepsis ([place]) 11/02/2019  . Systemic hypertension      Family History  Problem Relation Age of Onset  . Diabetes Mother   . Heart attack Father   . Cancer Sister      Current Outpatient Medications:  .  allopurinol (ZYLOPRIM) 100 MG tablet, Take 1 tablet (100 mg total) by mouth daily. (Patient taking differently: Take 100 mg by mouth 2 (two) times daily. ), Disp: 90 tablet, Rfl: 1 .  antiseptic oral rinse (BIOTENE) LIQD, 15 mLs by Mouth  Rinse route as needed for dry mouth., Disp: , Rfl:  .  aspirin 81 MG tablet, Take 162 mg by mouth daily., Disp: , Rfl:  .  benzonatate (TESSALON PERLES) 100 MG capsule, Take 1 capsule (100 mg total) by mouth every 6 (six) hours as needed., Disp: 30 capsule, Rfl: 1 .  Castellani Paint Modified 1.5 % LIQD, USE A  COTTON TIP APPLICATOR- PAINT BETWEEN TOES OF LEFT FOOT ONCE A DAY AS NEEDED, Disp: 29.57 mL, Rfl: 0 .  colchicine 0.6 MG tablet, Take 0.6 mg by mouth daily. , Disp: , Rfl:  .  Continuous Blood Gluc Sensor (FREESTYLE LIBRE 14 DAY SENSOR) MISC, Use as directed to check blood sugars, Disp: 6 each, Rfl: 1 .  diclofenac Sodium (VOLTAREN) 1 % GEL, Apply 2 g topically 4 (four) times daily., Disp: 100 g, Rfl: 2 .  diltiazem (CARDIZEM CD) 300 MG 24 hr capsule, Take 1 capsule (300 mg total) by mouth daily., Disp: 90 capsule, Rfl: 1 .  dorzolamide (TRUSOPT) 2 % ophthalmic solution, Place 1 drop into both eyes 2 (two) times daily., Disp: , Rfl:  .  ferrous sulfate 325 (65 FE) MG tablet, Take 325 mg by mouth daily with breakfast., Disp: , Rfl:  .  fish oil-omega-3 fatty acids 1000 MG capsule, Take 1 g by mouth 2 (two) times daily. , Disp: , Rfl:  .  furosemide (LASIX) 20 MG tablet, Take 1 tab midday for 3 days only., Disp: 30 tablet, Rfl: 0 .  furosemide (LASIX) 40 MG tablet, Take 2 tabs in morning and 1 tab evening, Disp: 180 tablet, Rfl: 0 .  gabapentin (NEURONTIN) 100 MG capsule, Take 1 capsule (100 mg total) by mouth 3 (three) times daily., Disp: 270 capsule, Rfl: 0 .  glucose blood (FREESTYLE PRECISION NEO TEST) test strip, Use as instructed, Disp: 100 each, Rfl: 12 .  Glycerin-Polysorbate 80 (REFRESH DRY EYE THERAPY OP), Place 1 drop into both eyes in the morning and at bedtime., Disp: , Rfl:  .  hydrocortisone (ANUSOL-HC) 25 MG suppository, Place 25 mg rectally 2 (two) times daily as needed for hemorrhoids or anal itching., Disp: , Rfl:  .  Insulin Degludec (TRESIBA FLEXTOUCH [place]), Inject 10 mg into the skin at bedtime., Disp: , Rfl:  .  ketotifen (ALAWAY) 0.025 % ophthalmic solution, Place 1 drop into both eyes 2 (two) times daily., Disp: , Rfl:  .  latanoprost (XALATAN) 0.005 % ophthalmic solution, Place 1 drop into both eyes at bedtime. , Disp: , Rfl:  .  losartan (COZAAR) 100 MG tablet, Take 0.5  tablets (50 mg total) by mouth at bedtime., Disp: 90 tablet, Rfl: 0 .  Multiple Vitamin (MULTIVITAMIN) tablet, Take 1 tablet by mouth daily., Disp: , Rfl:  .  niacin (NIASPAN) 500 MG CR tablet, TAKE 2 TABLETS AT BEDTIME, Disp: 180 tablet, Rfl: 1 .  pantoprazole (PROTONIX) 40 MG tablet, Take 1 tablet  by mouth daily. (Patient taking differently: Take 40 mg by mouth daily. ), Disp: 90 tablet, Rfl: 0 .  pramoxine-hydrocortisone (PRAMOSONE) cream, Apply topically 3 (three) times daily. Apply topically to hemorrhoids four times daily as needed after bowel movements, for external use only, Disp: , Rfl:  .  PRESCRIPTION MEDICATION, See admin instructions. CPAP- At bedtime, Disp: , Rfl:  .  rosuvastatin (CRESTOR) 10 MG tablet, Take 1 tablet (10 mg total) by mouth daily. (Patient taking differently: Take 10 mg by mouth once a week. ), Disp: 90 tablet, Rfl: 1 .  Semaglutide, 1  MG/DOSE, (OZEMPIC, [name],) 2 MG/1.5ML SOPN, Inject 1 mg into the skin once a week., Disp: 6 pen, Rfl: 1   No Known Allergies   Review of Systems  Constitutional: Negative for fatigue.  Eyes: Negative for blurred vision.  Respiratory: Negative.   Cardiovascular: Negative.   Endocrine: Negative for polydipsia, polyphagia and polyuria.  Skin:       Wound left great toe being managed at [facility] weekly - currently taking an antibiotic  Neurological: Negative for dizziness and headaches.  Psychiatric/Behavioral: Negative for confusion. The patient is not nervous/anxious.      Today's Vitals   05/20/20 0849  BP: 130/72  Pulse: 81  Temp: 97.8 F (36.6 C)  PainSc: 0-No pain   There is no height or weight on file to calculate BMI.   Objective:  Physical Exam Vitals reviewed.  Constitutional:      General: He is not in acute distress.    Appearance: Normal appearance.  HENT:     Ears:     Comments: Hearing aid is present to right ear. Reports left ear is completely deaf Cardiovascular:     Rate and Rhythm: Normal  rate and regular rhythm.     Pulses: Normal pulses.     Heart sounds: Normal heart sounds. No murmur heard.   Pulmonary:     Effort: Pulmonary effort is normal. No respiratory distress.     Breath sounds: Normal breath sounds.  Skin:    Capillary Refill: Capillary refill takes less than 2 seconds.     Comments: He has a dressing to his left foot that is being managed by the wound center  Neurological:     General: No focal deficit present.     Mental Status: He is alert and oriented to person, place, and time.     Cranial Nerves: No cranial nerve deficit.  Psychiatric:        Mood and Affect: Mood normal.        Behavior: Behavior normal.        Thought Content: Thought content normal.        Judgment: Judgment normal.         Assessment And Plan:     1. Type 2 diabetes mellitus with other circulatory complication, with long-term current use of insulin (HCC)  Chronic, fair control  Continue with current medications  Blood sugars are mostly in good range - CMP14+EGFR - Hemoglobin A1c  2. 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 Need for influenza vaccination  Influenza vaccine administered  Encouraged to take Tylenol as needed for fever or muscle aches. - Flu Vaccine QUAD High Dose(Fluad)  4. PAD (peripheral artery disease) (HCC)  Chronic, stable  5. Mixed hyperlipidemia  Chronic, stable,  Continue with current medications, which is tolerating well - Lipid panel  6. Bilateral hearing loss, unspecified hearing loss type  Wears hearing aid to right ear  7. Does use hearing aid  Right ear  8. Open wound of left great toe, subsequent encounter  Dressing is intact and is being followed by [facility]  Currently taking antibiotic for his wound    Patient was given opportunity to ask questions. Patient verbalized understanding of  the plan and was able to repeat key elements of the plan. All questions were answered to their satisfaction.    Teola Bradley, [name], have reviewed all documentation  for this visit. The documentation on 05/20/20 for the exam, diagnosis, procedures, and orders are all accurate and complete.  THE PATIENT IS ENCOURAGED TO PRACTICE SOCIAL DISTANCING DUE TO THE COVID-19 PANDEMIC.

## 2020-05-20 NOTE — Patient Instructions (Signed)

## 2020-05-22 ENCOUNTER — Encounter (HOSPITAL_BASED_OUTPATIENT_CLINIC_OR_DEPARTMENT_OTHER): Payer: Medicare Other | Admitting: Internal Medicine

## 2020-05-22 ENCOUNTER — Other Ambulatory Visit: Payer: Self-pay

## 2020-05-22 DIAGNOSIS — E1151 Type 2 diabetes mellitus with diabetic peripheral angiopathy without gangrene: Secondary | ICD-10-CM | POA: Diagnosis not present

## 2020-05-22 DIAGNOSIS — L97521 Non-pressure chronic ulcer of other part of left foot limited to breakdown of skin: Secondary | ICD-10-CM | POA: Diagnosis not present

## 2020-05-22 DIAGNOSIS — E11621 Type 2 diabetes mellitus with foot ulcer: Secondary | ICD-10-CM | POA: Diagnosis not present

## 2020-05-22 DIAGNOSIS — E1122 Type 2 diabetes mellitus with diabetic chronic kidney disease: Secondary | ICD-10-CM | POA: Diagnosis not present

## 2020-05-22 DIAGNOSIS — L97526 Non-pressure chronic ulcer of other part of left foot with bone involvement without evidence of necrosis: Secondary | ICD-10-CM | POA: Diagnosis not present

## 2020-05-22 DIAGNOSIS — I890 Lymphedema, not elsewhere classified: Secondary | ICD-10-CM | POA: Diagnosis not present

## 2020-05-22 DIAGNOSIS — I872 Venous insufficiency (chronic) (peripheral): Secondary | ICD-10-CM | POA: Diagnosis not present

## 2020-05-28 NOTE — Progress Notes (Signed)
Micheal Walls ([id number]) Visit Report for 05/22/2020 Debridement Details Patient Name: Date of Service: Micheal Walls 05/22/2020 9:15 A M Medical Record Number: [id number] Patient Account Number: [id number] Date of Birth/Sex: Treating RN: 08/02/1935 (84 y.o. Micheal Walls Primary Care Provider: [name] Other Clinician: Referring Provider: Treating Provider/Extender: Lynden Ang in Treatment: 7 Debridement Performed for Assessment: Wound #1 Left T Great oe Performed By: Physician Ricard Dillon., MD Debridement Type: Debridement Severity of Tissue Pre Debridement: Bone involvement without necrosis Level of Consciousness (Pre-procedure): Awake and Alert Pre-procedure Verification/Time Out Yes - 10:30 Taken: Start Time: 10:31 Pain Control: Other : benzocaine 20% spray T Area Debrided (L x W): otal 0.5 (cm) x 0.5 (cm) = 0.25 (cm) Tissue and other material debrided: Viable, Non-Viable, Slough, Subcutaneous, Slough Level: Skin/Subcutaneous Tissue Debridement Description: Excisional Instrument: Curette Bleeding: Minimum Hemostasis Achieved: Pressure End Time: 10:33 Procedural Pain: 0 Post Procedural Pain: 0 Response to Treatment: Procedure was tolerated well Level of Consciousness (Post- Awake and Alert procedure): Post Debridement Measurements of Total Wound Length: (cm) 0.5 Width: (cm) 0.5 Depth: (cm) 0.4 Volume: (cm) 0.079 Character of Wound/Ulcer Post Debridement: Improved Severity of Tissue Post Debridement: Bone involvement without necrosis Post Procedure Diagnosis Same as Pre-procedure Electronic Signature(s) Signed: 05/22/2020 5:15:15 PM By: Baruch Gouty RN, BSN Signed: 05/28/2020 8:09:40 AM By: Linton Ham MD Entered By: Baruch Gouty on 05/22/2020 10:33:05 -------------------------------------------------------------------------------- HPI Details Patient Name: Date of Service: Micheal Walls D.  05/22/2020 9:15 A M Medical Record Number: [id number] Patient Account Number: [id number] Date of Birth/Sex: Treating RN: Feb 20, 1936 (84 y.o. Micheal Walls Primary Care Provider: [name] Other Clinician: Referring Provider: Treating Provider/Extender: Lynden Ang in Treatment: 7 History of Present Illness HPI Description: 02/19/2020 upon evaluation today patient presents today for initial evaluation in clinic concerning issues that has been having with his bilateral lower extremities. He apparently also had a wound on his great toe. With that being said he does have a history of diabetes mellitus type 2, lymphedema, peripheral vascular disease, congestive heart failure, hypertension, right below-knee amputation, and chronic kidney disease stage IV according to records reviewed with his creatinine clearance being below 30. With that being said the legs have been actually worse than what they are currently he does not seem to have any open wounds at this point significantly he does have a small leaking area on the left leg at this point. With that being said the majority of what I see is a lot of skin changes secondary to the chronic lymphedema to be honest. There does not appear to be any signs of systemic infection or local infection based on what I see today. The patient is doing much better with regard to the wound on his toe as well according to the daughter. He has been seeing podiatry. They do have questions about whether or not he can perform physical therapy to have no limitations on physical therapy with regard to his wounds to be honest. With that being said he questions having some pain in the ankle region which again I am not really the specialist to comment on that therefore I recommended that he probably needs to discuss this with his podiatrist. 02/26/2020 upon evaluation today patient appears to actually be doing extremely well he is completely healed  and overall is looking excellent. They did get the compression stockings ordered yesterday so they should be on the way hopefully arriving if not tomorrow than Friday. In  the meantime we may just utilize a Ace wrap to try to keep the edema under as much control as possible until he gets his compression stockings. READMISSION 04/03/2021 This is an 84 year old man we had for 2 visits in the clinic in September seen by Jeri Cos. Patient has chronic venous insufficiency with secondary lymphedema. He had wounds on the left anterior leg these healed out. He is also known to Dr. Doren Custard last seen in August 2020. He is known to have an occluded PTA with monophasic waveforms has an 8 occluded tibioperoneal trunk. At the time he was not felt to be a candidate for angiography because of his stage IV chronic renal failure and his wounds were closed. If he was to require angiography he would need a CO2 angiogram. His last arterial studies were in August 2020 as well. He had triphasic waveforms down to the mid popliteal biphasic distally and then monophasic at the dorsalis of the tibioperoneal trunk, ATA occluded PTA monophasic distal PTA his peroneal was not visualized. He wears compression stockings but did not bring 1 in today He thinks the wound currently we are looking at started about 2 weeks ago. He thinks it is because of abrasion on his foot wear. He has not been wearing his shoe he switched into a left surgical sandal. He has been cleaning this with saline and wrapping it with gauze. ABI in our clinic was 0.52. Please visit previous arterial work-up by Dr. Doren Custard in August 2020 as noted. 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 We use silver collagen. He has known PAD and very significant lymphedema. I have not ordered arterial  studies or vascular consult unless this area worsens 11/12; this is a patient with a punched-out wound over the interphalangeal joint of his left great toe dorsally. He has severe PAD which is not listed is being amenable to revascularization or angiography because of his stage IV chronic renal failure. We have been using collagen 12/3; punched-out area over the inner phalangeal joint of the left great toe in the setting of known PAD. He was not felt previously to be a good revascularization candidate because of renal insufficiency. We have been using collagen. The patient states he noticed this draining about 2 to 3 days ago. When he arrived in clinic our intake nurse noted purulent drainage and wound a lot larger especially in depth of 0.7 cm 12/10; patient with a wound on the dorsal part in the inner phalangeal joint of the left great toe. This deteriorated quite a bit the last time he was here. A culture I did showed Staphylococcus lugdunensis. I put him on doxycycline and I will extend this today. X-ray showed no evidence of osteomyelitis. Really not any better today there is exposed bone at the bottom of this undermining that is hard to measure Electronic Signature(s) Signed: 05/28/2020 8:09:40 AM By: Linton Ham MD Entered By: Linton Ham on 05/22/2020 10:52:02 -------------------------------------------------------------------------------- Physical Exam Details Patient Name: Date of Service: Micheal Walls D. 05/22/2020 9:15 A M Medical Record Number: [id number] Patient Account Number: [id number] Date of Birth/Sex: Treating RN: 07-19-1935 (84 y.o. Micheal Walls Primary Care Provider: [name] Other Clinician: Referring Provider: Treating Provider/Extender: Lynden Ang in Treatment: 7 Constitutional Sitting or standing Blood Pressure is within target range for patient.. Pulse  regular and within target range for patient.Marland Kitchen Respirations regular,  non-labored and within target range.. Temperature is normal and within the target range for the patient.Marland Kitchen Appears in no distress. Notes Wound exam; this goes to bone. I used a #3 curette to remove necrotic debris from the surface hemostasis with direct pressure this undermines but it is a small hole and hard to measure. There is no erythema surrounding the wound. No obvious effusion in the inner phalangeal joint Electronic Signature(s) Signed: 05/28/2020 8:09:40 AM By: Linton Ham MD Entered By: Linton Ham on 05/22/2020 10:52:55 -------------------------------------------------------------------------------- Physician Orders Details Patient Name: Date of Service: Micheal Walls D. 05/22/2020 9:15 A M Medical Record Number: [id number] Patient Account Number: [id number] Date of Birth/Sex: Treating RN: 1936/03/25 (84 y.o. Micheal Walls Primary Care Provider: [name] Other Clinician: Referring Provider: Treating Provider/Extender: Lynden Ang in Treatment: 7 Verbal / Phone Orders: No Diagnosis Coding ICD-10 Coding Code Description E11.621 Type 2 diabetes mellitus with foot ulcer E11.51 Type 2 diabetes mellitus with diabetic peripheral angiopathy without gangrene L97.521 Non-pressure chronic ulcer of other part of left foot limited to breakdown of skin Follow-up Appointments Return Appointment in 1 week. Bathing/ Shower/ Hygiene May shower and wash wound with soap and water. Edema Control - Lymphedema / SCD / Other Bilateral Lower Extremities Elevate legs to the level of the heart or above for 30 minutes daily and/or when sitting, a frequency of: Avoid standing for long periods of time. Patient to wear own compression stockings every day. - both legs Exercise regularly Moisturize legs daily. Wound Treatment Wound #1 - T Great oe Wound Laterality: Left Cleanser: Soap and Water Every Other Day/15 Days Discharge Instructions: May  shower and wash wound with dial antibacterial soap and water prior to dressing change. Prim Dressing: Promogran Prisma Matrix, 4.34 (sq in) (silver collagen) (DME) (Dispense As Written) Every Other Day/15 Days ary Discharge Instructions: Moisten collagen with saline or hydrogel Secondary Dressing: Woven Gauze Sponges 2x2 in Every Other Day/15 Days Discharge Instructions: Apply over primary dressing as directed. Secured With: Child psychotherapist, Sterile 2x75 (in/in) Every Other Day/15 Days Discharge Instructions: Secure with stretch gauze as directed. Patient Medications llergies: No Known Drug Allergies A Notifications Medication Indication Start End prior to debridement 05/22/2020 benzocaine DOSE topical 20 % aerosol - aerosol topical wound infection doxycycline monohydrate DOSE oral 75 mg capsule - 1 capsule oral bid for 7 days (continuing rx) Electronic Signature(s) Signed: 05/22/2020 10:57:21 AM By: Linton Ham MD Entered By: Linton Ham on 05/22/2020 10:57:20 -------------------------------------------------------------------------------- Problem List Details Patient Name: Date of Service: Micheal Walls D. 05/22/2020 9:15 A M Medical Record Number: [id number] Patient Account Number: [id number] Date of Birth/Sex: Treating RN: 1936/02/13 (84 y.o. Micheal Walls Primary Care Provider: [name] Other Clinician: Referring Provider: Treating Provider/Extender: Lynden Ang in Treatment: 7 Active Problems ICD-10 Encounter Code Description Active Date MDM Diagnosis E11.621 Type 2 diabetes mellitus with foot ulcer 04/03/2020 No Yes E11.51 Type 2 diabetes mellitus with diabetic peripheral angiopathy without gangrene 04/03/2020 No Yes L97.521 Non-pressure chronic ulcer of other part of left foot limited to breakdown of 04/03/2020 No Yes skin Inactive Problems Resolved Problems Electronic Signature(s) Signed: 05/28/2020  8:09:40 AM By: Linton Ham MD Entered By: Linton Ham on 05/22/2020 10:50:48 -------------------------------------------------------------------------------- Progress Note Details Patient Name: Date of Service: Micheal Walls D. 05/22/2020 9:15 A M Medical Record Number: [id number] Patient Account Number: [id number] Date of Birth/Sex: Treating RN: 1936-05-20 (84 y.o. M)  Baruch Gouty Primary Care Provider: [name] Other Clinician: Referring Provider: Treating Provider/Extender: Lynden Ang in Treatment: 7 Subjective History of Present Illness (HPI) 02/19/2020 upon evaluation today patient presents today for initial evaluation in clinic concerning issues that has been having with his bilateral lower extremities. He apparently also had a wound on his great toe. With that being said he does have a history of diabetes mellitus type 2, lymphedema, peripheral vascular disease, congestive heart failure, hypertension, right below-knee amputation, and chronic kidney disease stage IV according to records reviewed with his creatinine clearance being below 30. With that being said the legs have been actually worse than what they are currently he does not seem to have any open wounds at this point significantly he does have a small leaking area on the left leg at this point. With that being said the majority of what I see is a lot of skin changes secondary to the chronic lymphedema to be honest. There does not appear to be any signs of systemic infection or local infection based on what I see today. The patient is doing much better with regard to the wound on his toe as well according to the daughter. He has been seeing podiatry. They do have questions about whether or not he can perform physical therapy to have no limitations on physical therapy with regard to his wounds to be honest. With that being said he questions having some pain in the ankle region which again  I am not really the specialist to comment on that therefore I recommended that he probably needs to discuss this with his podiatrist. 02/26/2020 upon evaluation today patient appears to actually be doing extremely well he is completely healed and overall is looking excellent. They did get the compression stockings ordered yesterday so they should be on the way hopefully arriving if not tomorrow than Friday. In the meantime we may just utilize a Ace wrap to try to keep the edema under as much control as possible until he gets his compression stockings. READMISSION 04/03/2021 This is an 84 year old man we had for 2 visits in the clinic in September seen by Jeri Cos. Patient has chronic venous insufficiency with secondary lymphedema. He had wounds on the left anterior leg these healed out. He is also known to Dr. Doren Custard last seen in August 2020. He is known to have an occluded PTA with monophasic waveforms has an 8 occluded tibioperoneal trunk. At the time he was not felt to be a candidate for angiography because of his stage IV chronic renal failure and his wounds were closed. If he was to require angiography he would need a CO2 angiogram. His last arterial studies were in August 2020 as well. He had triphasic waveforms down to the mid popliteal biphasic distally and then monophasic at the dorsalis of the tibioperoneal trunk, ATA occluded PTA monophasic distal PTA his peroneal was not visualized. He wears compression stockings but did not bring 1 in today He thinks the wound currently we are looking at started about 2 weeks ago. He thinks it is because of abrasion on his foot wear. He has not been wearing his shoe he switched into a left surgical sandal. He has been cleaning this with saline and wrapping it with gauze. ABI in our clinic was 0.52. Please visit previous arterial work-up by Dr. Doren Custard in August 2020 as noted. He was felt to have tibial vessel disease with numerous collaterals throughout the  calf 11/5; patient we readmitted  the clinic last week he has an area over the inner phalangeal joint of his left great toe dorsally. We use silver collagen. He has known PAD and very significant lymphedema. I have not ordered arterial studies or vascular consult unless this area worsens 11/12; this is a patient with a punched-out wound over the interphalangeal joint of his left great toe dorsally. He has severe PAD which is not listed is being amenable to revascularization or angiography because of his stage IV chronic renal failure. We have been using collagen 12/3; punched-out area over the inner phalangeal joint of the left great toe in the setting of known PAD. He was not felt previously to be a good revascularization candidate because of renal insufficiency. We have been using collagen. The patient states he noticed this draining about 2 to 3 days ago. When he arrived in clinic our intake nurse noted purulent drainage and wound a lot larger especially in depth of 0.7 cm 12/10; patient with a wound on the dorsal part in the inner phalangeal joint of the left great toe. This deteriorated quite a bit the last time he was here. A culture I did showed Staphylococcus lugdunensis. I put him on doxycycline and I will extend this today. X-ray showed no evidence of osteomyelitis. Really not any better today there is exposed bone at the bottom of this undermining that is hard to measure Objective Constitutional Sitting or standing Blood Pressure is within target range for patient.. Pulse regular and within target range for patient.Marland Kitchen Respirations regular, non-labored and within target range.. Temperature is normal and within the target range for the patient.Marland Kitchen Appears in no distress. Vitals Time Taken: 9:45 AM, Height: 73 in, Weight: 275 lbs, BMI: 36.3, Temperature: 97.4 F, Pulse: 80 bpm, Respiratory Rate: 18 breaths/min, Blood Pressure: 119/64 mmHg. General Notes: Wound exam; this goes to bone. I used a  #3 curette to remove necrotic debris from the surface hemostasis with direct pressure this undermines but it is a small hole and hard to measure. There is no erythema surrounding the wound. No obvious effusion in the inner phalangeal joint Integumentary (Hair, Skin) Wound #1 status is Open. Original cause of wound was Blister. The wound is located on the Left T Great. The wound measures 0.5cm length x 0.5cm width x oe 0.4cm depth; 0.196cm^2 area and 0.079cm^3 volume. There is Fat Layer (Subcutaneous Tissue) exposed. There is no tunneling or undermining noted. There is a medium amount of serosanguineous drainage noted. The wound margin is well defined and not attached to the wound base. There is medium (34-66%) pink, pale granulation within the wound bed. There is a medium (34-66%) amount of necrotic tissue within the wound bed including Adherent Slough. Assessment Active Problems ICD-10 Type 2 diabetes mellitus with foot ulcer Type 2 diabetes mellitus with diabetic peripheral angiopathy without gangrene Non-pressure chronic ulcer of other part of left foot limited to breakdown of skin Procedures Wound #1 Pre-procedure diagnosis of Wound #1 is a Diabetic Wound/Ulcer of the Lower Extremity located on the Left T Great .Severity of Tissue Pre Debridement is: oe Bone involvement without necrosis. There was a Excisional Skin/Subcutaneous Tissue Debridement with a total area of 0.25 sq cm performed by Ricard Dillon., MD. With the following instrument(s): Curette to remove Viable and Non-Viable tissue/material. Material removed includes Subcutaneous Tissue and Slough and after achieving pain control using Other (benzocaine 20% spray). No specimens were taken. A time out was conducted at 10:30, prior to the start of the procedure. A  Minimum amount of bleeding was controlled with Pressure. The procedure was tolerated well with a pain level of 0 throughout and a pain level of 0 following the  procedure. Post Debridement Measurements: 0.5cm length x 0.5cm width x 0.4cm depth; 0.079cm^3 volume. Character of Wound/Ulcer Post Debridement is improved. Severity of Tissue Post Debridement is: Bone involvement without necrosis. Post procedure Diagnosis Wound #1: Same as Pre-Procedure Plan Follow-up Appointments: Return Appointment in 1 week. Bathing/ Shower/ Hygiene: May shower and wash wound with soap and water. Edema Control - Lymphedema / SCD / Other: Elevate legs to the level of the heart or above for 30 minutes daily and/or when sitting, a frequency of: Avoid standing for long periods of time. Patient to wear own compression stockings every day. - both legs Exercise regularly Moisturize legs daily. The following medication(s) was prescribed: benzocaine topical 20 % aerosol aerosol topical for prior to debridement was prescribed at facility doxycycline monohydrate oral 75 mg capsule 1 capsule oral bid for 7 days (continuing rx) for wound infection WOUND #1: - T Great Wound Laterality: Left oe Cleanser: Soap and Water Every Other Day/15 Days Discharge Instructions: May shower and wash wound with dial antibacterial soap and water prior to dressing change. Prim Dressing: Promogran Prisma Matrix, 4.34 (sq in) (silver collagen) (DME) (Dispense As Written) Every Other Day/15 Days ary Discharge Instructions: Moisten collagen with saline or hydrogel Secondary Dressing: Woven Gauze Sponges 2x2 in Every Other Day/15 Days Discharge Instructions: Apply over primary dressing as directed. Secured With: Child psychotherapist, Sterile 2x75 (in/in) Every Other Day/15 Days Discharge Instructions: Secure with stretch gauze as directed. 1. I am going to change the dressing to silver collagen here we had been using silver alginate 2. Another week of doxycycline 3. I think he is going to need an MRI of this toe. We have not been able to make any progress 4. He has PAD and is previously  seen vascular. They did not wish to proceed with a standard angiogram because of the severity of his chronic renal failure. Truthfully with debridement there is a lot of bleeding here so I am not really sure of the status of the vascular supply to this area. Electronic Signature(s) Signed: 05/22/2020 10:57:40 AM By: Linton Ham MD Entered By: Linton Ham on 05/22/2020 10:57:39 -------------------------------------------------------------------------------- SuperBill Details Patient Name: Date of Service: Micheal Walls D. 05/22/2020 Medical Record Number: [id number] Patient Account Number: [id number] Date of Birth/Sex: Treating RN: 08-31-1935 (84 y.o. Micheal Walls Primary Care Provider: [name] Other Clinician: Referring Provider: Treating Provider/Extender: Lynden Ang in Treatment: 7 Diagnosis Coding ICD-10 Codes Code Description E11.621 Type 2 diabetes mellitus with foot ulcer E11.51 Type 2 diabetes mellitus with diabetic peripheral angiopathy without gangrene L97.521 Non-pressure chronic ulcer of other part of left foot limited to breakdown of skin Facility Procedures CPT4 Code: [id number] Description: 11552 - DEB SUBQ TISSUE 20 SQ CM/< ICD-10 Diagnosis Description L97.521 Non-pressure chronic ulcer of other part of left foot limited to breakdown of Modifier: skin Quantity: 1 Physician Procedures Electronic Signature(s) Signed: 05/28/2020 8:09:40 AM By: Linton Ham MD Entered By: Linton Ham on 05/22/2020 10:55:28

## 2020-05-28 NOTE — Progress Notes (Signed)
Micheal Walls, Micheal Walls ([id number]) Visit Report for 05/22/2020 Arrival Information Details Patient Name: Date of Service: Micheal Walls, Micheal Walls 05/22/2020 9:15 A M Medical Record Number: [id number] Patient Account Number: [id number] Date of Birth/Sex: Treating RN: 1935-12-21 (84 y.o. Ulyses Amor, Vaughan Basta Primary Care Kinsleigh Ludolph: [name] Other Clinician: Referring Courtney Bellizzi: Treating Annelise Mccoy/Extender: Lynden Ang in Treatment: 7 Visit Information History Since Last Visit Added or deleted any medications: No Patient Arrived: Ambulatory Any new allergies or adverse reactions: No Arrival Time: 09:43 Had a fall or experienced change in No Accompanied By: self activities of daily living that may affect Transfer Assistance: None risk of falls: Patient Identification Verified: Yes Signs or symptoms of abuse/neglect since last visito No Secondary Verification Process Completed: Yes Hospitalized since last visit: No Patient Requires Transmission-Based Precautions: No Implantable device outside of the clinic excluding No Patient Has Alerts: No cellular tissue based products placed in the center since last visit: Has Dressing in Place as Prescribed: Yes Pain Present Now: No Electronic Signature(s) Signed: 05/22/2020 9:52:05 AM By: Sandre Kitty Entered By: Sandre Kitty on 05/22/2020 09:45:46 -------------------------------------------------------------------------------- Encounter Discharge Information Details Patient Name: Date of Service: Micheal Quails D. 05/22/2020 9:15 A M Medical Record Number: [id number] Patient Account Number: [id number] Date of Birth/Sex: Treating RN: 09/30/1935 (84 y.o. Hessie Diener Primary Care Reegan Mctighe: [name] Other Clinician: Referring Georgie Eduardo: Treating Jung Yurchak/Extender: Lynden Ang in Treatment: 7 Encounter Discharge Information Items Post Procedure Vitals Discharge Condition:  Stable Temperature (F): 97.4 Ambulatory Status: Wheelchair Pulse (bpm): 80 Discharge Destination: Home Respiratory Rate (breaths/min): 18 Transportation: Private Auto Blood Pressure (mmHg): 119/64 Accompanied By: self Schedule Follow-up Appointment: Yes Clinical Summary of Care: Electronic Signature(s) Signed: 05/22/2020 2:12:11 PM By: Deon Pilling Entered By: Deon Pilling on 05/22/2020 11:54:19 -------------------------------------------------------------------------------- Lower Extremity Assessment Details Patient Name: Date of Service: Micheal Walls, Micheal Walls 05/22/2020 9:15 A M Medical Record Number: [id number] Patient Account Number: [id number] Date of Birth/Sex: Treating RN: [birth year] (84 y.o. Jerilynn Mages) Carlene Coria Primary Care Anaia Frith: [name] Other Clinician: Referring Kieley Akter: Treating Silus Lanzo/Extender: Orlie Pollen Weeks in Treatment: 7 Edema Assessment Assessed: Shirlyn Goltz: No] [Right: No] Edema: [Left: Ye] [Right: s] Calf Left: Right: Point of Measurement: 40 cm From Medial Instep 42 cm Ankle Left: Right: Point of Measurement: 13 cm From Medial Instep 34 cm Electronic Signature(s) Signed: 05/22/2020 2:11:24 PM By: Carlene Coria RN Entered By: Carlene Coria on 05/22/2020 10:05:33 -------------------------------------------------------------------------------- Multi Wound Chart Details Patient Name: Date of Service: Micheal Quails D. 05/22/2020 9:15 A M Medical Record Number: [id number] Patient Account Number: [id number] Date of Birth/Sex: Treating RN: September 16, 1935 (84 y.o. [name] Primary Care Jaiceon Collister: [name] Other Clinician: Referring Willamina Grieshop: Treating Shabrea Weldin/Extender: Lynden Ang in Treatment: 7 Vital Signs Height(in): 80 Pulse(bpm): 91 Weight(lbs): 275 Blood Pressure(mmHg): 119/64 Body Mass Index(BMI): 36 Temperature(F): 97.4 Respiratory Rate(breaths/min): 18 Photos: [1:No Photos  Left T Great oe] [N/A:N/A N/A] Wound Location: [1:Blister] [N/A:N/A] Wounding Event: [1:Diabetic Wound/Ulcer of the Lower] [N/A:N/A] Primary Etiology: [1:Extremity Cataracts, Anemia, Lymphedema,] [N/A:N/A] Comorbid History: [1:Sleep Apnea, Congestive Heart Failure, Hypertension, Peripheral Arterial Disease, Peripheral Venous Disease, Type II Diabetes, End Stage Renal Disease, Gout, Neuropathy 03/27/2020] [N/A:N/A] Date Acquired: [1:7] [N/A:N/A] Weeks of Treatment: [1:Open] [N/A:N/A] Wound Status: [1:0.5x0.5x0.4] [N/A:N/A] Measurements L x W x D (cm) [1:0.196] [N/A:N/A] A (cm) : rea [1:0.079] [N/A:N/A] Volume (cm) : [1:-532.30%] [N/A:N/A] % Reduction in A rea: [1:-315.80%] [N/A:N/A] % Reduction in Volume: [1:Grade 2] [N/A:N/A] Classification: [1:Medium] [N/A:N/A] Exudate A mount: [  1:Serosanguineous] [N/A:N/A] Exudate Type: [1:red, brown] [N/A:N/A] Exudate Color: [1:Well defined, not attached] [N/A:N/A] Wound Margin: [1:Medium (34-66%)] [N/A:N/A] Granulation A mount: [1:Pink, Pale] [N/A:N/A] Granulation Quality: [1:Medium (34-66%)] [N/A:N/A] Necrotic A mount: [1:Fat Layer (Subcutaneous Tissue): Yes N/A] Exposed Structures: [1:Fascia: No Tendon: No Muscle: No Joint: No Bone: No Small (1-33%)] [N/A:N/A] Epithelialization: [1:Debridement - Excisional] [N/A:N/A] Debridement: Pre-procedure Verification/Time Out 10:30 [N/A:N/A] Taken: [1:Other] [N/A:N/A] Pain Control: [1:Subcutaneous, Slough] [N/A:N/A] Tissue Debrided: [1:Skin/Subcutaneous Tissue] [N/A:N/A] Level: [1:0.25] [N/A:N/A] Debridement A (sq cm): [1:rea Curette] [N/A:N/A] Instrument: [1:Minimum] [N/A:N/A] Bleeding: [1:Pressure] [N/A:N/A] Hemostasis A chieved: [1:0] [N/A:N/A] Procedural Pain: [1:0] [N/A:N/A] Post Procedural Pain: [1:Procedure was tolerated well] [N/A:N/A] Debridement Treatment Response: [1:0.5x0.5x0.4] [N/A:N/A] Post Debridement Measurements L x W x D (cm) [1:0.079] [N/A:N/A] Post Debridement Volume: (cm)  [1:Debridement] [N/A:N/A] Treatment Notes Electronic Signature(s) Signed: 05/22/2020 5:15:15 PM By: Baruch Gouty RN, BSN Signed: 05/28/2020 8:09:40 AM By: Linton Ham MD Entered By: Linton Ham on 05/22/2020 10:50:55 -------------------------------------------------------------------------------- Multi-Disciplinary Care Plan Details Patient Name: Date of Service: Micheal Quails D. 05/22/2020 9:15 A M Medical Record Number: [id number] Patient Account Number: [id number] Date of Birth/Sex: Treating RN: 05-30-1936 (84 y.o. [name] Primary Care Isaiahs Chancy: [name] Other Clinician: Referring Steele Ledonne: Treating Adalind Weitz/Extender: Lynden Ang in Treatment: 7 Active Inactive Nutrition Nursing Diagnoses: Impaired glucose control: actual or potential Potential for alteratiion in Nutrition/Potential for imbalanced nutrition Goals: Patient/caregiver will maintain therapeutic glucose control Date Initiated: 04/03/2020 Target Resolution Date: 05/29/2020 Goal Status: Active Interventions: Assess HgA1c results as ordered upon admission and as needed Provide education on elevated blood sugars and impact on wound healing Treatment Activities: Patient referred to Primary Care Physician for further nutritional evaluation : 04/03/2020 Notes: Wound/Skin Impairment Nursing Diagnoses: Impaired tissue integrity Knowledge deficit related to ulceration/compromised skin integrity Goals: Patient/caregiver will verbalize understanding of skin care regimen Date Initiated: 04/03/2020 Target Resolution Date: 05/29/2020 Goal Status: Active Ulcer/skin breakdown will have a volume reduction of 50% by week 8 Date Initiated: 04/03/2020 Date Inactivated: 05/15/2020 Target Resolution Date: 05/01/2020 Goal Status: Unmet Unmet Reason: offloading issue Ulcer/skin breakdown will have a volume reduction of 80% by week 12 Date Initiated: 05/15/2020 Target  Resolution Date: 05/29/2020 Goal Status: Active Interventions: Assess patient/caregiver ability to obtain necessary supplies Assess patient/caregiver ability to perform ulcer/skin care regimen upon admission and as needed Assess ulceration(s) every visit Provide education on ulcer and skin care Treatment Activities: Skin care regimen initiated : 04/03/2020 Topical wound management initiated : 04/03/2020 Notes: Electronic Signature(s) Signed: 05/22/2020 5:15:15 PM By: Baruch Gouty RN, BSN Entered By: Baruch Gouty on 05/22/2020 10:29:26 -------------------------------------------------------------------------------- Pain Assessment Details Patient Name: Date of Service: Micheal Quails D. 05/22/2020 9:15 A M Medical Record Number: [id number] Patient Account Number: [id number] Date of Birth/Sex: Treating RN: [birth year] (84 y.o. [name] Primary Care Onis Markoff: [name] Other Clinician: Referring Maleyah Evans: Treating Brinly Maietta/Extender: Lynden Ang in Treatment: 7 Active Problems Location of Pain Severity and Description of Pain Patient Has Paino No Site Locations Pain Management and Medication Current Pain Management: Electronic Signature(s) Signed: 05/22/2020 9:52:05 AM By: Sandre Kitty Signed: 05/22/2020 5:15:15 PM By: Baruch Gouty RN, BSN Entered By: Sandre Kitty on 05/22/2020 09:46:08 -------------------------------------------------------------------------------- Patient/Caregiver Education Details Patient Name: Date of Service: Micheal Walls 12/10/2021andnbsp9:15 A M Medical Record Number: [id number] Patient Account Number: [id number] Date of Birth/Gender: Treating RN: 07/26/1935 (84 y.o. [name] Primary Care Physician: [name] Other Clinician: Referring Physician: Treating Physician/Extender: Lynden Ang in Treatment: 7 Education Assessment  Education Provided  To: Patient Education Topics Provided Elevated Blood Sugar/ Impact on Healing: Methods: Explain/Verbal Responses: Reinforcements needed, State content correctly Offloading: Methods: Explain/Verbal Responses: Reinforcements needed, State content correctly Wound/Skin Impairment: Methods: Explain/Verbal Responses: Reinforcements needed, State content correctly Electronic Signature(s) Signed: 05/22/2020 5:15:15 PM By: Baruch Gouty RN, BSN Entered By: Baruch Gouty on 05/22/2020 10:29:56 -------------------------------------------------------------------------------- Wound Assessment Details Patient Name: Date of Service: Micheal Quails D. 05/22/2020 9:15 A M Medical Record Number: [id number] Patient Account Number: [id number] Date of Birth/Sex: Treating RN: [birth year] (84 y.o. [name] Primary Care Velencia Lenart: [name] Other Clinician: Referring Koy Lamp: Treating Olean Sangster/Extender: Lynden Ang in Treatment: 7 Wound Status Wound Number: 1 Primary Diabetic Wound/Ulcer of the Lower Extremity Etiology: Wound Location: Left T Great oe Wound Open Wounding Event: Blister Status: Date Acquired: 03/27/2020 Date Acquired: 03/27/2020 Comorbid Cataracts, Anemia, Lymphedema, Sleep Apnea, Congestive Heart Weeks Of Treatment: 7 History: Failure, Hypertension, Peripheral Arterial Disease, Peripheral Venous Clustered Wound: No Disease, Type II Diabetes, End Stage Renal Disease, Gout, Neuropathy Photos Photo Uploaded By: Mikeal Hawthorne on 05/27/2020 13:16:52 Wound Measurements Length: (cm) 0.5 Width: (cm) 0.5 Depth: (cm) 0.4 Area: (cm) 0.196 Volume: (cm) 0.079 % Reduction in Area: -532.3% % Reduction in Volume: -315.8% Epithelialization: Small (1-33%) Tunneling: No Undermining: No Wound Description Classification: Grade 2 Wound Margin: Well defined, not attached Exudate Amount: Medium Exudate Type: Serosanguineous Exudate Color:  red, brown Foul Odor After Cleansing: No Slough/Fibrino Yes Wound Bed Granulation Amount: Medium (34-66%) Exposed Structure Granulation Quality: Pink, Pale Fascia Exposed: No Necrotic Amount: Medium (34-66%) Fat Layer (Subcutaneous Tissue) Exposed: Yes Necrotic Quality: Adherent Slough Tendon Exposed: No Muscle Exposed: No Joint Exposed: No Bone Exposed: No Treatment Notes Wound #1 (Toe Great) Wound Laterality: Left Cleanser Soap and Water Discharge Instruction: May shower and wash wound with dial antibacterial soap and water prior to dressing change. Peri-Wound Care Topical Primary Dressing Promogran Prisma Matrix, 4.34 (sq in) (silver collagen) Discharge Instruction: Moisten collagen with saline or hydrogel Secondary Dressing Woven Gauze Sponges 2x2 in Discharge Instruction: Apply over primary dressing as directed. Secured With Conforming Stretch Gauze Bandage, Sterile 2x75 (in/in) Discharge Instruction: Secure with stretch gauze as directed. Compression Wrap Compression Stockings Add-Ons Electronic Signature(s) Signed: 05/22/2020 2:11:24 PM By: Carlene Coria RN Signed: 05/22/2020 5:15:15 PM By: Baruch Gouty RN, BSN Signed: 05/22/2020 5:15:15 PM By: Baruch Gouty RN, BSN Previous Signature: 05/22/2020 9:52:05 AM Version By: Sandre Kitty Entered By: Carlene Coria on 05/22/2020 10:05:19 -------------------------------------------------------------------------------- Vitals Details Patient Name: Date of Service: Micheal Quails D. 05/22/2020 9:15 A M Medical Record Number: [id number] Patient Account Number: [id number] Date of Birth/Sex: Treating RN: 10/03/1935 (84 y.o. [name] Primary Care Danyah Guastella: [name] Other Clinician: Referring Brayleigh Rybacki: Treating Tyion Boylen/Extender: Lynden Ang in Treatment: 7 Vital Signs Time Taken: 09:45 Temperature (F): 97.4 Height (in): 73 Pulse (bpm): 80 Weight (lbs):  275 Respiratory Rate (breaths/min): 18 Body Mass Index (BMI): 36.3 Blood Pressure (mmHg): 119/64 Reference Range: 80 - 120 mg / dl Electronic Signature(s) Signed: 05/22/2020 9:52:05 AM By: Sandre Kitty Entered By: Sandre Kitty on 05/22/2020 09:46:02

## 2020-05-29 ENCOUNTER — Encounter (HOSPITAL_BASED_OUTPATIENT_CLINIC_OR_DEPARTMENT_OTHER): Payer: Medicare Other | Admitting: Internal Medicine

## 2020-05-29 ENCOUNTER — Other Ambulatory Visit: Payer: Self-pay

## 2020-05-29 DIAGNOSIS — I890 Lymphedema, not elsewhere classified: Secondary | ICD-10-CM | POA: Diagnosis not present

## 2020-05-29 DIAGNOSIS — E11621 Type 2 diabetes mellitus with foot ulcer: Secondary | ICD-10-CM | POA: Diagnosis not present

## 2020-05-29 DIAGNOSIS — E1122 Type 2 diabetes mellitus with diabetic chronic kidney disease: Secondary | ICD-10-CM | POA: Diagnosis not present

## 2020-05-29 DIAGNOSIS — L97522 Non-pressure chronic ulcer of other part of left foot with fat layer exposed: Secondary | ICD-10-CM | POA: Diagnosis not present

## 2020-05-29 DIAGNOSIS — E1151 Type 2 diabetes mellitus with diabetic peripheral angiopathy without gangrene: Secondary | ICD-10-CM | POA: Diagnosis not present

## 2020-05-29 DIAGNOSIS — I872 Venous insufficiency (chronic) (peripheral): Secondary | ICD-10-CM | POA: Diagnosis not present

## 2020-05-29 DIAGNOSIS — L97521 Non-pressure chronic ulcer of other part of left foot limited to breakdown of skin: Secondary | ICD-10-CM | POA: Diagnosis not present

## 2020-05-29 NOTE — Progress Notes (Signed)
Micheal Walls ([id number]) Visit Report for 05/29/2020 Arrival Information Details Patient Name: Date of Service: Micheal Walls, Micheal Walls 05/29/2020 10:00 A M Medical Record Number: [id number] Patient Account Number: [id number] Date of Birth/Sex: Treating RN: Oct 31, 1935 (84 y.o. Micheal Walls) Carlene Coria Primary Care Patton Swisher: [name] Other Clinician: Referring Gizel Riedlinger: Treating Ladaja Yusupov/Extender: Lynden Ang in Treatment: 8 Visit Information History Since Last Visit All ordered tests and consults were completed: No Patient Arrived: Wheel Chair Added or deleted any medications: No Arrival Time: 10:25 Any new allergies or adverse reactions: No Accompanied By: self Had a fall or experienced change in No Transfer Assistance: None activities of daily living that may affect Patient Identification Verified: Yes risk of falls: Secondary Verification Process Completed: Yes Signs or symptoms of abuse/neglect since last visito No Patient Requires Transmission-Based Precautions: No Hospitalized since last visit: No Patient Has Alerts: No Implantable device outside of the clinic excluding No cellular tissue based products placed in the center since last visit: Has Dressing in Place as Prescribed: Yes Pain Present Now: No Electronic Signature(s) Signed: 05/29/2020 5:23:39 PM By: Carlene Coria RN Entered By: Carlene Coria on 05/29/2020 10:25:26 -------------------------------------------------------------------------------- Clinic Level of Care Assessment Details Patient Name: Date of Service: Micheal Walls 05/29/2020 10:00 A M Medical Record Number: [id number] Patient Account Number: [id number] Date of Birth/Sex: Treating RN: 1935/08/17 (84 y.o. Micheal Walls Primary Care Singleton Hickox: [name] Other Clinician: Referring Paeton Studer: Treating Micheal Walls/Extender: Lynden Ang in Treatment: 8 Clinic Level of Care Assessment  Items TOOL 4 Quantity Score X- 1 0 Use when only an EandM is performed on FOLLOW-UP visit ASSESSMENTS - Nursing Assessment / Reassessment X- 1 10 Reassessment of Co-morbidities (includes updates in patient status) X- 1 5 Reassessment of Adherence to Treatment Plan ASSESSMENTS - Wound and Skin A ssessment / Reassessment X - Simple Wound Assessment / Reassessment - one wound 1 5 []  - 0 Complex Wound Assessment / Reassessment - multiple wounds []  - 0 Dermatologic / Skin Assessment (not related to wound area) ASSESSMENTS - Focused Assessment []  - 0 Circumferential Edema Measurements - multi extremities []  - 0 Nutritional Assessment / Counseling / Intervention X- 1 5 Lower Extremity Assessment (monofilament, tuning fork, pulses) []  - 0 Peripheral Arterial Disease Assessment (using hand held doppler) ASSESSMENTS - Ostomy and/or Continence Assessment and Care []  - 0 Incontinence Assessment and Management []  - 0 Ostomy Care Assessment and Management (repouching, etc.) PROCESS - Coordination of Care X - Simple Patient / Family Education for ongoing care 1 15 []  - 0 Complex (extensive) Patient / Family Education for ongoing care X- 1 10 Staff obtains Programmer, systems, Records, T Results / Process Orders est []  - 0 Staff telephones HHA, Nursing Homes / Clarify orders / etc []  - 0 Routine Transfer to another Facility (non-emergent condition) []  - 0 Routine Hospital Admission (non-emergent condition) []  - 0 New Admissions / Biomedical engineer / Ordering NPWT Apligraf, etc. , []  - 0 Emergency Hospital Admission (emergent condition) X- 1 10 Simple Discharge Coordination []  - 0 Complex (extensive) Discharge Coordination PROCESS - Special Needs []  - 0 Pediatric / Minor Patient Management []  - 0 Isolation Patient Management []  - 0 Hearing / Language / Visual special needs []  - 0 Assessment of Community assistance (transportation, D/C planning, etc.) []  - 0 Additional  assistance / Altered mentation []  - 0 Support Surface(s) Assessment (bed, cushion, seat, etc.) INTERVENTIONS - Wound Cleansing / Measurement X - Simple Wound Cleansing - one wound 1  5 '[]'$  - 0 Complex Wound Cleansing - multiple wounds X- 1 5 Wound Imaging (photographs - any number of wounds) $RemoveBe'[]'znmDkcszt$  - 0 Wound Tracing (instead of photographs) X- 1 5 Simple Wound Measurement - one wound $RemoveB'[]'XjVqaFSu$  - 0 Complex Wound Measurement - multiple wounds INTERVENTIONS - Wound Dressings X - Small Wound Dressing one or multiple wounds 1 10 $Re'[]'xls$  - 0 Medium Wound Dressing one or multiple wounds $RemoveBeforeD'[]'fhuIvlEZAFxIAZ$  - 0 Large Wound Dressing one or multiple wounds $RemoveBeforeD'[]'WKNJIGSGmTMSqh$  - 0 Application of Medications - topical $RemoveB'[]'dlVKUqCf$  - 0 Application of Medications - injection INTERVENTIONS - Miscellaneous $RemoveBeforeD'[]'wGWRngBudmExcZ$  - 0 External ear exam $Remove'[]'Eyjueqk$  - 0 Specimen Collection (cultures, biopsies, blood, body fluids, etc.) $RemoveBefor'[]'DCyghkpqnSAv$  - 0 Specimen(s) / Culture(s) sent or taken to Lab for analysis $RemoveBefo'[]'gJvTbDRhMkd$  - 0 Patient Transfer (multiple staff / Civil Service fast streamer / Similar devices) $RemoveBeforeDE'[]'zMYqCGHFSpyIVBP$  - 0 Simple Staple / Suture removal (25 or less) $Remove'[]'gTYNzxw$  - 0 Complex Staple / Suture removal (26 or more) $Remove'[]'wTESjCt$  - 0 Hypo / Hyperglycemic Management (close monitor of Blood Glucose) $RemoveBefore'[]'KQecKtrUbTXGd$  - 0 Ankle / Brachial Index (ABI) - do not check if billed separately X- 1 5 Vital Signs Has the patient been seen at the hospital within the last three years: Yes Total Score: 90 Level Of Care: New/Established - Level 3 Electronic Signature(s) Signed: 05/29/2020 5:46:53 PM By: Levan Hurst RN, BSN Entered By: Levan Hurst on 05/29/2020 17:29:38 -------------------------------------------------------------------------------- Encounter Discharge Information Details Patient Name: Date of Service: Micheal Quails D. 05/29/2020 10:00 A M Medical Record Number: [id number] Patient Account Number: [id number] Date of Birth/Sex: Treating RN: 1935-06-30 (84 y.o. [name], [name] Primary Care Eivan Gallina: [name] Other  Clinician: Referring Davontae Prusinski: Treating Tela Kotecki/Extender: Lynden Ang in Treatment: 8 Encounter Discharge Information Items Discharge Condition: Stable Ambulatory Status: Wheelchair Discharge Destination: Home Transportation: Private Auto Accompanied By: self Schedule Follow-up Appointment: Yes Clinical Summary of Care: Patient Declined Electronic Signature(s) Signed: 05/29/2020 5:28:37 PM By: Rhae Hammock RN Entered By: Rhae Hammock on 05/29/2020 14:27:43 -------------------------------------------------------------------------------- Lower Extremity Assessment Details Patient Name: Date of Service: Micheal Quails D. 05/29/2020 10:00 A M Medical Record Number: [id number] Patient Account Number: [id number] Date of Birth/Sex: Treating RN: [birth year] (84 y.o. Micheal Walls) Carlene Coria Primary Care Tyrianna Lightle: [name] Other Clinician: Referring Pervis Macintyre: Treating Lubna Stegeman/Extender: Orlie Pollen Weeks in Treatment: 8 Edema Assessment Assessed: Shirlyn Goltz: No] [Right: No] Edema: [Left: Ye] [Right: s] Calf Left: Right: Point of Measurement: 40 cm From Medial Instep 42 cm Ankle Left: Right: Point of Measurement: 13 cm From Medial Instep 34 cm Electronic Signature(s) Signed: 05/29/2020 5:23:39 PM By: Carlene Coria RN Entered By: Carlene Coria on 05/29/2020 10:26:01 -------------------------------------------------------------------------------- Multi Wound Chart Details Patient Name: Date of Service: Micheal Quails D. 05/29/2020 10:00 A M Medical Record Number: [id number] Patient Account Number: [id number] Date of Birth/Sex: Treating RN: 12/31/1935 (84 y.o. Micheal Walls [facility] Care [facility]: [name] Other Clinician: Referring Emryn Flanery: Treating Rakan Soffer/Extender: Lynden Ang in Treatment: 8 Vital Signs Height(in): 25 Pulse(bpm): 24 Weight(lbs): 275 Blood Pressure(mmHg): 113/66 Body Mass  Index(BMI): 36 Temperature(F): 98.3 Respiratory Rate(breaths/min): 18 Photos: [1:No Photos Left T Great oe] [N/A:N/A N/A] Wound Location: [1:Blister] [N/A:N/A] Wounding Event: [1:Diabetic Wound/Ulcer of the Lower] [N/A:N/A] Primary Etiology: [1:Extremity Cataracts, Anemia, Lymphedema,] [N/A:N/A] Comorbid History: [1:Sleep Apnea, Congestive Heart Failure, Hypertension, Peripheral Arterial Disease, Peripheral Venous Disease, Type II Diabetes, End Stage Renal Disease, Gout, Neuropathy 03/27/2020] [N/A:N/A] Date Acquired: [1:8] [N/A:N/A] Weeks of Treatment: [1:Open] [N/A:N/A] Wound Status: [1:0.3x0.7x0.2] [N/A:N/A] Measurements L x  W x D (cm) [1:0.165] [N/A:N/A] A (cm) : rea [1:0.033] [N/A:N/A] Volume (cm) : [1:-432.30%] [N/A:N/A] % Reduction in A rea: [1:-73.70%] [N/A:N/A] % Reduction in Volume: [1:12] Starting Position 1 (o'clock): [1:12] Ending Position 1 (o'clock): [1:0.2] Maximum Distance 1 (cm): [1:Yes] [N/A:N/A] Undermining: [1:Grade 2] [N/A:N/A] Classification: [1:Medium] [N/A:N/A] Exudate A mount: [1:Serosanguineous] [N/A:N/A] Exudate Type: [1:red, brown] [N/A:N/A] Exudate Color: [1:Well defined, not attached] [N/A:N/A] Wound Margin: [1:Medium (34-66%)] [N/A:N/A] Granulation A mount: [1:Pink, Pale] [N/A:N/A] Granulation Quality: [1:Medium (34-66%)] [N/A:N/A] Necrotic A mount: [1:Fat Layer (Subcutaneous Tissue): Yes N/A] Exposed Structures: [1:Fascia: No Tendon: No Muscle: No Joint: No Bone: No Small (1-33%)] [N/A:N/A] Treatment Notes Electronic Signature(s) Signed: 05/29/2020 4:58:57 PM By: Linton Ham MD Signed: 05/29/2020 5:46:53 PM By: Levan Hurst RN, BSN Entered By: Linton Ham on 05/29/2020 11:23:39 -------------------------------------------------------------------------------- Multi-Disciplinary Care Plan Details Patient Name: Date of Service: Micheal Quails D. 05/29/2020 10:00 A M Medical Record Number: [id number] Patient Account Number:  [id number] Date of Birth/Sex: Treating RN: 25-May-1936 (84 y.o. Micheal Walls Primary Care Lisa-Marie Rueger: [name] Other Clinician: Referring Mordecai Tindol: Treating Lawerance Matsuo/Extender: Lynden Ang in Treatment: 8 Active Inactive Wound/Skin Impairment Nursing Diagnoses: Impaired tissue integrity Knowledge deficit related to ulceration/compromised skin integrity Goals: Patient/caregiver will verbalize understanding of skin care regimen Date Initiated: 04/03/2020 Target Resolution Date: 06/26/2020 Goal Status: Active Ulcer/skin breakdown will have a volume reduction of 50% by week 8 Date Initiated: 04/03/2020 Date Inactivated: 05/15/2020 Target Resolution Date: 05/01/2020 Goal Status: Unmet Unmet Reason: offloading issue Ulcer/skin breakdown will have a volume reduction of 80% by week 12 Date Initiated: 05/15/2020 Target Resolution Date: 06/26/2020 Goal Status: Active Interventions: Assess patient/caregiver ability to obtain necessary supplies Assess patient/caregiver ability to perform ulcer/skin care regimen upon admission and as needed Assess ulceration(s) every visit Provide education on ulcer and skin care Treatment Activities: Skin care regimen initiated : 04/03/2020 Topical wound management initiated : 04/03/2020 Notes: Electronic Signature(s) Signed: 05/29/2020 5:46:53 PM By: Levan Hurst RN, BSN Entered By: Levan Hurst on 05/29/2020 17:29:06 -------------------------------------------------------------------------------- Pain Assessment Details Patient Name: Date of Service: Micheal Quails D. 05/29/2020 10:00 A M Medical Record Number: [id number] Patient Account Number: [id number] Date of Birth/Sex: Treating RN: 03-Sep-1935 (84 y.o. Micheal Walls) Carlene Coria Primary Care Cashe Gatt: [name] Other Clinician: Referring Nyaisha Simao: Treating Inga Noller/Extender: Orlie Pollen Weeks in Treatment: 8 Active Problems Location of Pain  Severity and Description of Pain Patient Has Paino No Site Locations Pain Management and Medication Current Pain Management: Electronic Signature(s) Signed: 05/29/2020 5:23:39 PM By: Carlene Coria RN Entered By: Carlene Coria on 05/29/2020 10:25:56 -------------------------------------------------------------------------------- Patient/Caregiver Education Details Patient Name: Date of Service: Goings, EV ERETT D. 12/17/2021andnbsp10:00 A M Medical Record Number: [id number] Patient Account Number: [id number] Date of Birth/Gender: Treating RN: Nov 22, 1935 (84 y.o. Micheal Walls Primary Care Physician: [name] Other Clinician: Referring Physician: Treating Physician/Extender: Lynden Ang in Treatment: 8 Education Assessment Education Provided To: Patient Education Topics Provided Wound/Skin Impairment: Methods: Explain/Verbal Responses: State content correctly Electronic Signature(s) Signed: 05/29/2020 5:46:53 PM By: Levan Hurst RN, BSN Entered By: Levan Hurst on 05/29/2020 17:29:16 -------------------------------------------------------------------------------- Wound Assessment Details Patient Name: Date of Service: Micheal Quails D. 05/29/2020 10:00 A M Medical Record Number: [id number] Patient Account Number: [id number] Date of Birth/Sex: Treating RN: [birth year] (84 y.o. Oval Linsey Primary Care Khalia Gong: [name] Other Clinician: Referring Ottavio Norem: Treating Kadiatou Oplinger/Extender: Lynden Ang in Treatment: 8 Wound Status Wound Number: 1 Primary Diabetic Wound/Ulcer of the Lower Extremity Etiology: Wound Location:  Left T Great oe Wound Open Wounding Event: Blister Status: Date Acquired: 03/27/2020 Comorbid Cataracts, Anemia, Lymphedema, Sleep Apnea, Congestive Heart Weeks Of Treatment: 8 History: Failure, Hypertension, Peripheral Arterial Disease, Peripheral Venous Clustered Wound: No  Disease, Type II Diabetes, End Stage Renal Disease, Gout, Neuropathy Wound Measurements Length: (cm) 0.3 Width: (cm) 0.7 Depth: (cm) 0.2 Area: (cm) 0.165 Volume: (cm) 0.033 % Reduction in Area: -432.3% % Reduction in Volume: -73.7% Epithelialization: Small (1-33%) Tunneling: No Undermining: Yes Starting Position (o'clock): 12 Ending Position (o'clock): 12 Maximum Distance: (cm) 0.2 Wound Description Classification: Grade 2 Wound Margin: Well defined, not attached Exudate Amount: Medium Exudate Type: Serosanguineous Exudate Color: red, brown Foul Odor After Cleansing: No Slough/Fibrino Yes Wound Bed Granulation Amount: Medium (34-66%) Exposed Structure Granulation Quality: Pink, Pale Fascia Exposed: No Necrotic Amount: Medium (34-66%) Fat Layer (Subcutaneous Tissue) Exposed: Yes Necrotic Quality: Adherent Slough Tendon Exposed: No Muscle Exposed: No Joint Exposed: No Bone Exposed: No Treatment Notes Wound #1 (Toe Great) Wound Laterality: Left Cleanser Soap and Water Discharge Instruction: May shower and wash wound with dial antibacterial soap and water prior to dressing change. Peri-Wound Care Topical Primary Dressing Promogran Prisma Matrix, 4.34 (sq in) (silver collagen) Discharge Instruction: Moisten collagen with saline or hydrogel Secondary Dressing Woven Gauze Sponges 2x2 in Discharge Instruction: Apply over primary dressing as directed. Secured With Conforming Stretch Gauze Bandage, Sterile 2x75 (in/in) Discharge Instruction: Secure with stretch gauze as directed. Paper Tape, 1x10 (in/yd) Discharge Instruction: Secure dressing with tape as directed. Compression Wrap Compression Stockings Add-Ons Electronic Signature(s) Signed: 05/29/2020 5:23:39 PM By: Carlene Coria RN Entered By: Carlene Coria on 05/29/2020 10:26:27 -------------------------------------------------------------------------------- Vitals Details Patient Name: Date of  Service: Micheal Quails D. 05/29/2020 10:00 A M Medical Record Number: [id number] Patient Account Number: [id number] Date of Birth/Sex: Treating RN: Mar 13, 1936 (84 y.o. Micheal Walls) Carlene Coria Primary Care Macarena Langseth: [name] Other Clinician: Referring Chrsitopher Wik: Treating Jezebel Pollet/Extender: Lynden Ang in Treatment: 8 Vital Signs Time Taken: 10:25 Temperature (F): 98.3 Height (in): 73 Pulse (bpm): 80 Weight (lbs): 275 Respiratory Rate (breaths/min): 18 Body Mass Index (BMI): 36.3 Blood Pressure (mmHg): 113/66 Reference Range: 80 - 120 mg / dl Electronic Signature(s) Signed: 05/29/2020 5:23:39 PM By: Carlene Coria RN Entered By: Carlene Coria on 05/29/2020 10:25:49

## 2020-06-01 NOTE — Progress Notes (Signed)
Micheal Walls ([id number]) Visit Report for 05/29/2020 HPI Details Patient Name: Date of Service: Micheal Walls, Micheal Walls 05/29/2020 10:00 A M Medical Record Number: [id number] Patient Account Number: [id number] Date of Birth/Sex: Treating RN: Micheal Walls (84 y.o. Micheal Walls Primary Care Provider: [name] Other Clinician: Referring Provider: Treating Provider/Extender: Lynden Ang in Treatment: 8 History of Present Illness HPI Description: 02/19/2020 upon evaluation today patient presents today for initial evaluation in clinic concerning issues that has been having with his bilateral lower extremities. He apparently also had a wound on his great toe. With that being said he does have a history of diabetes mellitus type 2, lymphedema, peripheral vascular disease, congestive heart failure, hypertension, right below-knee amputation, and chronic kidney disease stage IV according to records reviewed with his creatinine clearance being below 30. With that being said the legs have been actually worse than what they are currently he does not seem to have any open wounds at this point significantly he does have a small leaking area on the left leg at this point. With that being said the majority of what I see is a lot of skin changes secondary to the chronic lymphedema to be honest. There does not appear to be any signs of systemic infection or local infection based on what I see today. The patient is doing much better with regard to the wound on his toe as well according to the daughter. He has been seeing podiatry. They do have questions about whether or not he can perform physical therapy to have no limitations on physical therapy with regard to his wounds to be honest. With that being said he questions having some pain in the ankle region which again I am not really the specialist to comment on that therefore I recommended that he probably needs to discuss this  with his podiatrist. 02/26/2020 upon evaluation today patient appears to actually be doing extremely well he is completely healed and overall is looking excellent. They did get the compression stockings ordered yesterday so they should be on the way hopefully arriving if not tomorrow than Friday. In the meantime we may just utilize a Ace wrap to try to keep the edema under as much control as possible until he gets his compression stockings. READMISSION 04/03/2021 This is an 84 year old man we had for 2 visits in the clinic in September seen by Jeri Cos. Patient has chronic venous insufficiency with secondary lymphedema. He had wounds on the left anterior leg these healed out. He is also known to Dr. Doren Custard last seen in August 2020. He is known to have an occluded PTA with monophasic waveforms has an 8 occluded tibioperoneal trunk. At the time he was not felt to be a candidate for angiography because of his stage IV chronic renal failure and his wounds were closed. If he was to require angiography he would need a CO2 angiogram. His last arterial studies were in August 2020 as well. He had triphasic waveforms down to the mid popliteal biphasic distally and then monophasic at the dorsalis of the tibioperoneal trunk, ATA occluded PTA monophasic distal PTA his peroneal was not visualized. He wears compression stockings but did not bring 1 in today He thinks the wound currently we are looking at started about 2 weeks ago. He thinks it is because of abrasion on his foot wear. He has not been wearing his shoe he switched into a left surgical sandal. He has been cleaning this with saline and wrapping it  with gauze. ABI in our clinic was 0.52. Please visit previous arterial work-up by Dr. Doren Custard in August 2020 as noted. 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 We use silver collagen. He has known PAD and very significant lymphedema. I have not ordered arterial studies or vascular consult unless this area worsens 11/12; this is a patient with a punched-out wound over the interphalangeal joint of his left great toe dorsally. He has severe PAD which is not listed is being amenable to revascularization or angiography because of his stage IV chronic renal failure. We have been using collagen 12/3; punched-out area over the inner phalangeal joint of the left great toe in the setting of known PAD. He was not felt previously to be a good revascularization candidate because of renal insufficiency. We have been using collagen. The patient states he noticed this draining about 2 to 3 days ago. When he arrived in clinic our intake nurse noted purulent drainage and wound a lot larger especially in depth of 0.7 cm 12/10; patient with a wound on the dorsal part in the inner phalangeal joint of the left great toe. This deteriorated quite a bit the last time he was here. A culture I did showed Staphylococcus lugdunensis. I put him on doxycycline and I will extend this today. X-ray showed no evidence of osteomyelitis. Really not any better today there is exposed bone at the bottom of this undermining that is hard to measure 12/17; is a patient with a wound on the dorsal part of the anterior phalangeal joint of his left great toe. I have him on antibiotics and I gave him a 2-week course of doxycycline this seems to have close down the wound quite a bit and there is no longer palpable bone. Electronic Signature(s) Signed: 05/29/2020 4:58:57 PM By: Micheal Ham MD Entered By: Micheal Walls on 05/29/2020 11:24:33 -------------------------------------------------------------------------------- Physical Exam Details Patient Name: Date of Service: Micheal Quails D. 05/29/2020 10:00 A M Medical Record Number: [id number] Patient Account Number:  [id number] Date of Birth/Sex: Treating RN: [birth year] (84 y.o. Micheal Walls Primary Care Provider: [name] Other Clinician: Referring Provider: Treating Provider/Extender: Lynden Ang in Treatment: 8 Constitutional Sitting or standing Blood Pressure is within target range for patient.. Pulse regular and within target range for patient.Marland Kitchen Respirations regular, non-labored and within target range.. Temperature is normal and within the target range for the patient.Marland Kitchen Appears in no distress. Notes Wound exam; this did not go to bone today versus last week. There is undermining circumferentially but no palpable bone. There is no surrounding erythema Electronic Signature(s) Signed: 05/29/2020 4:58:57 PM By: Micheal Ham MD Entered By: Micheal Walls on 05/29/2020 11:25:37 -------------------------------------------------------------------------------- Physician Orders Details Patient Name: Date of Service: Micheal Quails D. 05/29/2020 10:00 A M Medical Record Number: [id number] Patient Account Number: [id number] Date of Birth/Sex: Treating RN: [birth year] (84 y.o. Micheal Walls Primary Care Provider: [name] Other Clinician: Referring Provider: Treating Provider/Extender: Lynden Ang in Treatment: 8 Verbal / Phone Orders: No Diagnosis Coding ICD-10 Coding Code Description E11.621 Type 2 diabetes mellitus with foot ulcer E11.51 Type 2 diabetes mellitus with diabetic peripheral angiopathy without gangrene L97.521 Non-pressure chronic ulcer of other part of left foot limited to breakdown of skin Follow-up Appointments ppointment in 2 weeks. - Tuesday 12/28 Return  A Bathing/ Shower/ Hygiene May shower and wash wound with soap and water. Edema Control - Lymphedema / SCD / Other Bilateral Lower Extremities Elevate legs to the level of the heart or above for 30 minutes daily and/or when sitting, a frequency of: -  throughout the day Avoid standing for long periods of time. Patient to wear own compression stockings every day. - both legs Exercise regularly Moisturize legs daily. Wound Treatment Wound #1 - T Great oe Wound Laterality: Left Cleanser: Soap and Water Every Other Day/15 Days Discharge Instructions: May shower and wash wound with dial antibacterial soap and water prior to dressing change. Prim Dressing: Promogran Prisma Matrix, 4.34 (sq in) (silver collagen) (Dispense As Written) Every Other Day/15 Days ary Discharge Instructions: Moisten collagen with saline or hydrogel Secondary Dressing: Woven Gauze Sponges 2x2 in Every Other Day/15 Days Discharge Instructions: Apply over primary dressing as directed. Secured With: Child psychotherapist, Sterile 2x75 (in/in) Every Other Day/15 Days Discharge Instructions: Secure with stretch gauze as directed. Secured With: Paper Tape, 1x10 (in/yd) Every Other Day/15 Days Discharge Instructions: Secure dressing with tape as directed. Electronic Signature(s) Signed: 05/29/2020 4:58:57 PM By: Micheal Ham MD Signed: 05/29/2020 5:46:53 PM By: Levan Hurst RN, BSN Entered By: Levan Hurst on 05/29/2020 11:07:03 -------------------------------------------------------------------------------- Problem List Details Patient Name: Date of Service: Micheal Quails D. 05/29/2020 10:00 A M Medical Record Number: [id number] Patient Account Number: [id number] Date of Birth/Sex: Treating RN: [birth year]Walls (84 y.o. Micheal Walls Primary Care Provider: [name] Other Clinician: Referring Provider: Treating Provider/Extender: Lynden Ang in Treatment: 8 Active Problems ICD-10 Encounter Code Description Active Date MDM Diagnosis E11.621 Type 2 diabetes mellitus with foot ulcer 04/03/2020 No Yes E11.51 Type 2 diabetes mellitus with diabetic peripheral angiopathy without gangrene 04/03/2020 No Yes L97.521  Non-pressure chronic ulcer of other part of left foot limited to breakdown of 04/03/2020 No Yes skin Inactive Problems Resolved Problems Electronic Signature(s) Signed: 05/29/2020 4:58:57 PM By: Micheal Ham MD Entered By: Micheal Walls on 05/29/2020 11:23:29 -------------------------------------------------------------------------------- Progress Note Details Patient Name: Date of Service: Micheal Quails D. 05/29/2020 10:00 A M Medical Record Number: [id number] Patient Account Number: [id number] Date of Birth/Sex: Treating RN: [birth year] Walls (84 y.o. Micheal Walls Primary Care Provider: [name] Other Clinician: Referring Provider: Treating Provider/Extender: Lynden Ang in Treatment: 8 Subjective History of Present Illness (HPI) 02/19/2020 upon evaluation today patient presents today for initial evaluation in clinic concerning issues that has been having with his bilateral lower extremities. He apparently also had a wound on his great toe. With that being said he does have a history of diabetes mellitus type 2, lymphedema, peripheral vascular disease, congestive heart failure, hypertension, right below-knee amputation, and chronic kidney disease stage IV according to records reviewed with his creatinine clearance being below 30. With that being said the legs have been actually worse than what they are currently he does not seem to have any open wounds at this point significantly he does have a small leaking area on the left leg at this point. With that being said the majority of what I see is a lot of skin changes secondary to the chronic lymphedema to be honest. There does not appear to be any signs of systemic infection or local infection based on what I see today. The patient is doing much better with regard to the wound on his toe as well according to the daughter. He has been seeing podiatry. They do have questions about  whether or not he can  perform physical therapy to have no limitations on physical therapy with regard to his wounds to be honest. With that being said he questions having some pain in the ankle region which again I am not really the specialist to comment on that therefore I recommended that he probably needs to discuss this with his podiatrist. 02/26/2020 upon evaluation today patient appears to actually be doing extremely well he is completely healed and overall is looking excellent. They did get the compression stockings ordered yesterday so they should be on the way hopefully arriving if not tomorrow than Friday. In the meantime we may just utilize a Ace wrap to try to keep the edema under as much control as possible until he gets his compression stockings. READMISSION 04/03/2021 This is an 84 year old man we had for 2 visits in the clinic in September seen by Jeri Cos. Patient has chronic venous insufficiency with secondary lymphedema. He had wounds on the left anterior leg these healed out. He is also known to Dr. Doren Custard last seen in August 2020. He is known to have an occluded PTA with monophasic waveforms has an 8 occluded tibioperoneal trunk. At the time he was not felt to be a candidate for angiography because of his stage IV chronic renal failure and his wounds were closed. If he was to require angiography he would need a CO2 angiogram. His last arterial studies were in August 2020 as well. He had triphasic waveforms down to the mid popliteal biphasic distally and then monophasic at the dorsalis of the tibioperoneal trunk, ATA occluded PTA monophasic distal PTA his peroneal was not visualized. He wears compression stockings but did not bring 1 in today He thinks the wound currently we are looking at started about 2 weeks ago. He thinks it is because of abrasion on his foot wear. He has not been wearing his shoe he switched into a left surgical sandal. He has been cleaning this with saline and wrapping it with  gauze. ABI in our clinic was 0.52. Please visit previous arterial work-up by Dr. Doren Custard in August 2020 as noted. 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 We use silver collagen. He has known PAD and very significant lymphedema. I have not ordered arterial studies or vascular consult unless this area worsens 11/12; this is a patient with a punched-out wound over the interphalangeal joint of his left great toe dorsally. He has severe PAD which is not listed is being amenable to revascularization or angiography because of his stage IV chronic renal failure. We have been using collagen 12/3; punched-out area over the inner phalangeal joint of the left great toe in the setting of known PAD. He was not felt previously to be a good revascularization candidate because of renal insufficiency. We have been using collagen. The patient states he noticed this draining about 2 to 3 days ago. When he arrived in clinic our intake nurse noted purulent drainage and wound a lot larger especially in depth of 0.7 cm 12/10; patient with a wound on the dorsal part in the inner phalangeal joint of the left great toe. This deteriorated quite a bit the last time he was here. A culture I did showed Staphylococcus lugdunensis. I put him on doxycycline and I will extend this today. X-ray showed no evidence of  osteomyelitis. Really not any better today there is exposed bone at the bottom of this undermining that is hard to measure 12/17; is a patient with a wound on the dorsal part of the anterior phalangeal joint of his left great toe. I have him on antibiotics and I gave him a 2-week course of doxycycline this seems to have close down the wound quite a bit and there is no longer palpable bone. Objective Constitutional Sitting or standing Blood Pressure is  within target range for patient.. Pulse regular and within target range for patient.Marland Kitchen Respirations regular, non-labored and within target range.. Temperature is normal and within the target range for the patient.Marland Kitchen Appears in no distress. Vitals Time Taken: 10:25 AM, Height: 73 in, Weight: 275 lbs, BMI: 36.3, Temperature: 98.3 F, Pulse: 80 bpm, Respiratory Rate: 18 breaths/min, Blood Pressure: 113/66 mmHg. General Notes: Wound exam; this did not go to bone today versus last week. There is undermining circumferentially but no palpable bone. There is no surrounding erythema Integumentary (Hair, Skin) Wound #1 status is Open. Original cause of wound was Blister. The wound is located on the Left T Great. The wound measures 0.3cm length x 0.7cm width x oe 0.2cm depth; 0.165cm^2 area and 0.033cm^3 volume. There is Fat Layer (Subcutaneous Tissue) exposed. There is no tunneling noted, however, there is undermining starting at 12:00 and ending at 12:00 with a maximum distance of 0.2cm. There is a medium amount of serosanguineous drainage noted. The wound margin is well defined and not attached to the wound base. There is medium (34-66%) pink, pale granulation within the wound bed. There is a medium (34- 66%) amount of necrotic tissue within the wound bed including Adherent Slough. Assessment Active Problems ICD-10 Type 2 diabetes mellitus with foot ulcer Type 2 diabetes mellitus with diabetic peripheral angiopathy without gangrene Non-pressure chronic ulcer of other part of left foot limited to breakdown of skin Plan Follow-up Appointments: Return Appointment in 2 weeks. - Tuesday 12/28 Bathing/ Shower/ Hygiene: May shower and wash wound with soap and water. Edema Control - Lymphedema / SCD / Other: Elevate legs to the level of the heart or above for 30 minutes daily and/or when sitting, a frequency of: - throughout the day Avoid standing for long periods of time. Patient to wear own compression  stockings every day. - both legs Exercise regularly Moisturize legs daily. WOUND #1: - T Great Wound Laterality: Left oe Cleanser: Soap and Water Every Other Day/15 Days Discharge Instructions: May shower and wash wound with dial antibacterial soap and water prior to dressing change. Prim Dressing: Promogran Prisma Matrix, 4.34 (sq in) (silver collagen) (Dispense As Written) Every Other Day/15 Days ary Discharge Instructions: Moisten collagen with saline or hydrogel Secondary Dressing: Woven Gauze Sponges 2x2 in Every Other Day/15 Days Discharge Instructions: Apply over primary dressing as directed. Secured With: Child psychotherapist, Sterile 2x75 (in/in) Every Other Day/15 Days Discharge Instructions: Secure with stretch gauze as directed. Secured With: Paper T ape, 1x10 (in/yd) Every Other Day/15 Days Discharge Instructions: Secure dressing with tape as directed. 1. The patient has completed 2 weeks of doxycycline and things look a little better. There is no exposed bone 2. Undermining is present but I did not debride this today I will monitor the progress next week. 3. Continue with silver collagen as the primary dressing surprisingly the patient is changing this himself 4. No evidence of worrisome infection 5. He does have known PAD. I am considering referral back if this wound deteriorates again  Electronic Signature(s) Signed: 05/29/2020 4:58:57 PM By: Micheal Ham MD Entered By: Micheal Walls on 05/29/2020 11:28:24 -------------------------------------------------------------------------------- SuperBill Details Patient Name: Date of Service: Micheal Quails D. 05/29/2020 Medical Record Number: [id number] Patient Account Number: [id number] Date of Birth/Sex: Treating RN: [birth year] Walls (84 y.o. Micheal Walls Primary Care Provider: [name] Other Clinician: Referring Provider: Treating Provider/Extender: Lynden Ang in  Treatment: 8 Diagnosis Coding ICD-10 Codes Code Description [phone] Type 2 diabetes mellitus with foot ulcer E11.51 Type 2 diabetes mellitus with diabetic peripheral angiopathy without gangrene L97.521 Non-pressure chronic ulcer of other part of left foot limited to breakdown of skin Facility Procedures CPT4 Code: [id number] Description: 99213 - WOUND CARE VISIT-LEV 3 EST PT Modifier: Quantity: 1 Physician Procedures : CPT4 Code Description Modifier [id number] - WC PHYS LEVEL 3 - EST PT ICD-10 Diagnosis Description E11.621 Type 2 diabetes mellitus with foot ulcer E11.51 Type 2 diabetes mellitus with diabetic peripheral angiopathy without gangrene L97.521  Non-pressure chronic ulcer of other part of left foot limited to breakdown of skin Quantity: 1 Electronic Signature(s) Signed: 05/29/2020 5:46:53 PM By: Levan Hurst RN, BSN Signed: 06/01/2020 7:23:52 PM By: Micheal Ham MD Previous Signature: 05/29/2020 4:58:57 PM Version By: Micheal Ham MD Entered By: Levan Hurst on 05/29/2020 17:29:46

## 2020-06-09 ENCOUNTER — Other Ambulatory Visit: Payer: Self-pay

## 2020-06-09 ENCOUNTER — Encounter (HOSPITAL_BASED_OUTPATIENT_CLINIC_OR_DEPARTMENT_OTHER): Payer: Medicare Other | Admitting: Internal Medicine

## 2020-06-09 DIAGNOSIS — E11621 Type 2 diabetes mellitus with foot ulcer: Secondary | ICD-10-CM | POA: Diagnosis not present

## 2020-06-09 DIAGNOSIS — L97522 Non-pressure chronic ulcer of other part of left foot with fat layer exposed: Secondary | ICD-10-CM | POA: Diagnosis not present

## 2020-06-09 DIAGNOSIS — E1151 Type 2 diabetes mellitus with diabetic peripheral angiopathy without gangrene: Secondary | ICD-10-CM | POA: Diagnosis not present

## 2020-06-09 DIAGNOSIS — I890 Lymphedema, not elsewhere classified: Secondary | ICD-10-CM | POA: Diagnosis not present

## 2020-06-09 DIAGNOSIS — E1122 Type 2 diabetes mellitus with diabetic chronic kidney disease: Secondary | ICD-10-CM | POA: Diagnosis not present

## 2020-06-09 DIAGNOSIS — L97521 Non-pressure chronic ulcer of other part of left foot limited to breakdown of skin: Secondary | ICD-10-CM | POA: Diagnosis not present

## 2020-06-09 DIAGNOSIS — I872 Venous insufficiency (chronic) (peripheral): Secondary | ICD-10-CM | POA: Diagnosis not present

## 2020-06-10 NOTE — Progress Notes (Signed)
Micheal Walls ([id number]) Visit Report for 06/09/2020 Debridement Details Patient Name: Date of Service: Micheal Walls, Micheal Walls 06/09/2020 10:00 A M Medical Record Number: [id number] Patient Account Number: [id number] Date of Birth/Sex: Treating RN: 03-14-1936 (84 y.o. [name] Primary Care Provider: [name] Other Clinician: Referring Provider: Treating Provider/Extender: Lynden Ang in Treatment: 9 Debridement Performed for Assessment: Wound #1 Left T Great oe Performed By: Physician Ricard Dillon., MD Debridement Type: Debridement Severity of Tissue Pre Debridement: Fat layer exposed Level of Consciousness (Pre-procedure): Awake and Alert Pre-procedure Verification/Time Out Yes - 10:00 Taken: Start Time: 10:02 Pain Control: Other : benzocaine 20% spray T Area Debrided (L x W): otal 1.5 (cm) x 1.1 (cm) = 1.65 (cm) Tissue and other material debrided: Viable, Non-Viable, Callus, Slough, Subcutaneous, Skin: Epidermis, Slough Level: Skin/Subcutaneous Tissue Debridement Description: Excisional Instrument: Curette Bleeding: Minimum Hemostasis Achieved: Pressure End Time: 10:05 Procedural Pain: 0 Post Procedural Pain: 0 Response to Treatment: Procedure was tolerated well Level of Consciousness (Post- Awake and Alert procedure): Post Debridement Measurements of Total Wound Length: (cm) 1.5 Width: (cm) 1.1 Depth: (cm) 0.2 Volume: (cm) 0.259 Character of Wound/Ulcer Post Debridement: Improved Severity of Tissue Post Debridement: Fat layer exposed Post Procedure Diagnosis Same as Pre-procedure Electronic Signature(s) Signed: 06/09/2020 6:35:32 PM By: Baruch Gouty RN, BSN Signed: 06/10/2020 12:07:59 PM By: Linton Ham MD Entered By: Linton Ham on 06/09/2020 10:09:05 -------------------------------------------------------------------------------- HPI Details Patient Name: Date of Service: Micheal Quails D.  06/09/2020 10:00 A M Medical Record Number: [id number] Patient Account Number: [id number] Date of Birth/Sex: Treating RN: 1935/08/24 (84 y.o. [name] Primary Care Provider: [name] Other Clinician: Referring Provider: Treating Provider/Extender: Lynden Ang in Treatment: 9 History of Present Illness HPI Description: 02/19/2020 upon evaluation today patient presents today for initial evaluation in clinic concerning issues that has been having with his bilateral lower extremities. He apparently also had a wound on his great toe. With that being said he does have a history of diabetes mellitus type 2, lymphedema, peripheral vascular disease, congestive heart failure, hypertension, right below-knee amputation, and chronic kidney disease stage IV according to records reviewed with his creatinine clearance being below 30. With that being said the legs have been actually worse than what they are currently he does not seem to have any open wounds at this point significantly he does have a small leaking area on the left leg at this point. With that being said the majority of what I see is a lot of skin changes secondary to the chronic lymphedema to be honest. There does not appear to be any signs of systemic infection or local infection based on what I see today. The patient is doing much better with regard to the wound on his toe as well according to the daughter. He has been seeing podiatry. They do have questions about whether or not he can perform physical therapy to have no limitations on physical therapy with regard to his wounds to be honest. With that being said he questions having some pain in the ankle region which again I am not really the specialist to comment on that therefore I recommended that he probably needs to discuss this with his podiatrist. 02/26/2020 upon evaluation today patient appears to actually be doing extremely well he is completely healed  and overall is looking excellent. They did get the compression stockings ordered yesterday so they should be on the way hopefully arriving if not tomorrow than Friday.  In the meantime we may just utilize a Ace wrap to try to keep the edema under as much control as possible until he gets his compression stockings. READMISSION 04/03/2021 This is an 84 year old man we had for 2 visits in the clinic in September seen by Jeri Cos. Patient has chronic venous insufficiency with secondary lymphedema. He had wounds on the left anterior leg these healed out. He is also known to Dr. Doren Custard last seen in August 2020. He is known to have an occluded PTA with monophasic waveforms has an 8 occluded tibioperoneal trunk. At the time he was not felt to be a candidate for angiography because of his stage IV chronic renal failure and his wounds were closed. If he was to require angiography he would need a CO2 angiogram. His last arterial studies were in August 2020 as well. He had triphasic waveforms down to the mid popliteal biphasic distally and then monophasic at the dorsalis of the tibioperoneal trunk, ATA occluded PTA monophasic distal PTA his peroneal was not visualized. He wears compression stockings but did not bring 1 in today He thinks the wound currently we are looking at started about 2 weeks ago. He thinks it is because of abrasion on his foot wear. He has not been wearing his shoe he switched into a left surgical sandal. He has been cleaning this with saline and wrapping it with gauze. ABI in our clinic was 0.52. Please visit previous arterial work-up by Dr. Doren Custard in August 2020 as noted. 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 We use silver collagen. He has known PAD and very significant lymphedema. I have not ordered arterial  studies or vascular consult unless this area worsens 11/12; this is a patient with a punched-out wound over the interphalangeal joint of his left great toe dorsally. He has severe PAD which is not listed is being amenable to revascularization or angiography because of his stage IV chronic renal failure. We have been using collagen 12/3; punched-out area over the inner phalangeal joint of the left great toe in the setting of known PAD. He was not felt previously to be a good revascularization candidate because of renal insufficiency. We have been using collagen. The patient states he noticed this draining about 2 to 3 days ago. When he arrived in clinic our intake nurse noted purulent drainage and wound a lot larger especially in depth of 0.7 cm 12/10; patient with a wound on the dorsal part in the inner phalangeal joint of the left great toe. This deteriorated quite a bit the last time he was here. A culture I did showed Staphylococcus lugdunensis. I put him on doxycycline and I will extend this today. X-ray showed no evidence of osteomyelitis. Really not any better today there is exposed bone at the bottom of this undermining that is hard to measure 12/17; is a patient with a wound on the dorsal part of the inter phalangeal joint of his left great toe. I have him on antibiotics and I gave him a 2-week course of doxycycline this seems to have close down the wound quite a bit and there is no longer palpable bone. 12/28; dorsal part of the interphalangeal joint of the left great toe. There is no palpable bone which is improvement. He completed antibiotics. Has been using silver collagen Electronic Signature(s) Signed:  06/10/2020 12:07:59 PM By: Linton Ham MD Entered By: Linton Ham on 06/09/2020 10:10:11 -------------------------------------------------------------------------------- Physical Exam Details Patient Name: Date of Service: Micheal Quails D. 06/09/2020 10:00 A M Medical  Record Number: [id number] Patient Account Number: [id number] Date of Birth/Sex: Treating RN: 1935/07/17 (84 y.o. [name] Primary Care Provider: [name] Other Clinician: Referring Provider: Treating Provider/Extender: Lynden Ang in Treatment: 9 Constitutional Sitting or standing Blood Pressure is within target range for patient.. Pulse regular and within target range for patient.Marland Kitchen Respirations regular, non-labored and within target range.. Temperature is normal and within the target range for the patient.Marland Kitchen Appears in no distress. Cardiovascular Patient has lymphedema in the left leg and dorsal foot making his pedal pulses difficult to feel. Notes Wound exam; came in today with callus skin and subcutaneous tissue and significant undermining around the wound edge itself. I used a #5 curette to remove all of the overhanging tissue. Minimal bleeding noted which in itself is a bit of a problem. There is no exposed bone Electronic Signature(s) Signed: 06/10/2020 12:07:59 PM By: Linton Ham MD Entered By: Linton Ham on 06/09/2020 10:12:04 -------------------------------------------------------------------------------- Physician Orders Details Patient Name: Date of Service: Micheal Quails D. 06/09/2020 10:00 A M Medical Record Number: [id number] Patient Account Number: [id number] Date of Birth/Sex: Treating RN: Dec 29, 1935 (84 y.o. [name] Primary Care Provider: [name] Other Clinician: Referring Provider: Treating Provider/Extender: Lynden Ang in Treatment: 9 Verbal / Phone Orders: No Diagnosis Coding ICD-10 Coding Code Description E11.621 Type 2 diabetes mellitus with foot ulcer E11.51 Type 2 diabetes mellitus with diabetic peripheral angiopathy without gangrene L97.521 Non-pressure chronic ulcer of other part of left foot limited to breakdown of skin Follow-up Appointments Return Appointment  in 2 weeks. Bathing/ Shower/ Hygiene May shower and wash wound with soap and water. Edema Control - Lymphedema / SCD / Other Bilateral Lower Extremities Elevate legs to the level of the heart or above for 30 minutes daily and/or when sitting, a frequency of: - throughout the day Avoid standing for long periods of time. Patient to wear own compression stockings every day. - both legs Exercise regularly Moisturize legs daily. Wound Treatment Wound #1 - T Great oe Wound Laterality: Left Cleanser: Soap and Water Every Other Day/15 Days Discharge Instructions: May shower and wash wound with dial antibacterial soap and water prior to dressing change. Prim Dressing: Hydrofera Blue Ready Foam, 4x5 in Every Other Day/15 Days ary Discharge Instructions: Apply to wound bed as instructed Secondary Dressing: Woven Gauze Sponges 2x2 in Every Other Day/15 Days Discharge Instructions: Apply over primary dressing as directed. Secured With: Child psychotherapist, Sterile 2x75 (in/in) Every Other Day/15 Days Discharge Instructions: Secure with stretch gauze as directed. Secured With: Paper Tape, 1x10 (in/yd) Every Other Day/15 Days Discharge Instructions: Secure dressing with tape as directed. Patient Medications llergies: No Known Drug Allergies A Notifications Medication Indication Start End prior to debridement 06/09/2020 benzocaine DOSE topical 20 % aerosol - aerosol topical Electronic Signature(s) Signed: 06/09/2020 6:35:32 PM By: Baruch Gouty RN, BSN Signed: 06/10/2020 12:07:59 PM By: Linton Ham MD Entered By: Baruch Gouty on 06/09/2020 10:10:25 -------------------------------------------------------------------------------- Problem List Details Patient Name: Date of Service: Micheal Quails D. 06/09/2020 10:00 A M Medical Record Number: [id number] Patient Account Number: [id number] Date of Birth/Sex: Treating RN: 1935/08/16 (84 y.o. [name] Primary Care  Provider: [name] Other Clinician: Referring Provider: Treating Provider/Extender: Lynden Ang in Treatment: 9 Active Problems ICD-10  Encounter Code Description Active Date MDM Diagnosis E11.621 Type 2 diabetes mellitus with foot ulcer 04/03/2020 No Yes E11.51 Type 2 diabetes mellitus with diabetic peripheral angiopathy without gangrene 04/03/2020 No Yes L97.521 Non-pressure chronic ulcer of other part of left foot limited to breakdown of 04/03/2020 No Yes skin Inactive Problems Resolved Problems Electronic Signature(s) Signed: 06/10/2020 12:07:59 PM By: Linton Ham MD Entered By: Linton Ham on 06/09/2020 10:08:37 -------------------------------------------------------------------------------- Progress Note Details Patient Name: Date of Service: Micheal Quails D. 06/09/2020 10:00 A M Medical Record Number: [id number] Patient Account Number: [id number] Date of Birth/Sex: Treating RN: Nov 14, 1935 (84 y.o. [name] Primary Care Provider: [name] Other Clinician: Referring Provider: Treating Provider/Extender: Lynden Ang in Treatment: 9 Subjective History of Present Illness (HPI) 02/19/2020 upon evaluation today patient presents today for initial evaluation in clinic concerning issues that has been having with his bilateral lower extremities. He apparently also had a wound on his great toe. With that being said he does have a history of diabetes mellitus type 2, lymphedema, peripheral vascular disease, congestive heart failure, hypertension, right below-knee amputation, and chronic kidney disease stage IV according to records reviewed with his creatinine clearance being below 30. With that being said the legs have been actually worse than what they are currently he does not seem to have any open wounds at this point significantly he does have a small leaking area on the left leg at this point. With that  being said the majority of what I see is a lot of skin changes secondary to the chronic lymphedema to be honest. There does not appear to be any signs of systemic infection or local infection based on what I see today. The patient is doing much better with regard to the wound on his toe as well according to the daughter. He has been seeing podiatry. They do have questions about whether or not he can perform physical therapy to have no limitations on physical therapy with regard to his wounds to be honest. With that being said he questions having some pain in the ankle region which again I am not really the specialist to comment on that therefore I recommended that he probably needs to discuss this with his podiatrist. 02/26/2020 upon evaluation today patient appears to actually be doing extremely well he is completely healed and overall is looking excellent. They did get the compression stockings ordered yesterday so they should be on the way hopefully arriving if not tomorrow than Friday. In the meantime we may just utilize a Ace wrap to try to keep the edema under as much control as possible until he gets his compression stockings. READMISSION 04/03/2021 This is an 84 year old man we had for 2 visits in the clinic in September seen by Jeri Cos. Patient has chronic venous insufficiency with secondary lymphedema. He had wounds on the left anterior leg these healed out. He is also known to Dr. Doren Custard last seen in August 2020. He is known to have an occluded PTA with monophasic waveforms has an 8 occluded tibioperoneal trunk. At the time he was not felt to be a candidate for angiography because of his stage IV chronic renal failure and his wounds were closed. If he was to require angiography he would need a CO2 angiogram. His last arterial studies were in August 2020 as well. He had triphasic waveforms down to the mid popliteal biphasic distally and then monophasic at the dorsalis of the tibioperoneal  trunk, ATA occluded PTA monophasic distal  PTA his peroneal was not visualized. He wears compression stockings but did not bring 1 in today He thinks the wound currently we are looking at started about 2 weeks ago. He thinks it is because of abrasion on his foot wear. He has not been wearing his shoe he switched into a left surgical sandal. He has been cleaning this with saline and wrapping it with gauze. ABI in our clinic was 0.52. Please visit previous arterial work-up by Dr. Doren Custard in August 2020 as noted. 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 We use silver collagen. He has known PAD and very significant lymphedema. I have not ordered arterial studies or vascular consult unless this area worsens 11/12; this is a patient with a punched-out wound over the interphalangeal joint of his left great toe dorsally. He has severe PAD which is not listed is being amenable to revascularization or angiography because of his stage IV chronic renal failure. We have been using collagen 12/3; punched-out area over the inner phalangeal joint of the left great toe in the setting of known PAD. He was not felt previously to be a good revascularization candidate because of renal insufficiency. We have been using collagen. The patient states he noticed this draining about 2 to 3 days ago. When he arrived in clinic our intake nurse noted purulent drainage and wound a lot larger especially in depth of 0.7 cm 12/10; patient with a wound on the dorsal part in the inner phalangeal joint of the left great toe. This deteriorated quite a bit the last time he was here. A culture I did showed Staphylococcus lugdunensis. I put him on doxycycline and I will extend this today. X-ray showed no evidence of osteomyelitis. Really not any better today there is  exposed bone at the bottom of this undermining that is hard to measure 12/17; is a patient with a wound on the dorsal part of the inter phalangeal joint of his left great toe. I have him on antibiotics and I gave him a 2-week course of doxycycline this seems to have close down the wound quite a bit and there is no longer palpable bone. 12/28; dorsal part of the interphalangeal joint of the left great toe. There is no palpable bone which is improvement. He completed antibiotics. Has been using silver collagen Objective Constitutional Sitting or standing Blood Pressure is within target range for patient.. Pulse regular and within target range for patient.Marland Kitchen Respirations regular, non-labored and within target range.. Temperature is normal and within the target range for the patient.Marland Kitchen Appears in no distress. Vitals Time Taken: 9:43 AM, Height: 73 in, Weight: 275 lbs, BMI: 36.3, Temperature: 98.1 F, Pulse: 94 bpm, Respiratory Rate: 18 breaths/min, Blood Pressure: 146/77 mmHg. Cardiovascular Patient has lymphedema in the left leg and dorsal foot making his pedal pulses difficult to feel. General Notes: Wound exam; came in today with callus skin and subcutaneous tissue and significant undermining around the wound edge itself. I used a #5 curette to remove all of the overhanging tissue. Minimal bleeding noted which in itself is a bit of a problem. There is no exposed bone Integumentary (Hair, Skin) Wound #1 status is Open. Original cause of wound was Blister. The wound is located on the Left T Great. The wound measures 0.3cm length x 0.6cm width x oe 0.2cm depth; 0.141cm^2  area and 0.028cm^3 volume. There is Fat Layer (Subcutaneous Tissue) exposed. There is no tunneling noted, however, there is undermining starting at 12:00 and ending at 1:00 with a maximum distance of 0.5cm. There is a medium amount of serosanguineous drainage noted. The wound margin is well defined and not attached to the wound base.  There is large (67-100%) pink, pale granulation within the wound bed. There is a small (1-33%) amount of necrotic tissue within the wound bed including Adherent Slough. Assessment Active Problems ICD-10 Type 2 diabetes mellitus with foot ulcer Type 2 diabetes mellitus with diabetic peripheral angiopathy without gangrene Non-pressure chronic ulcer of other part of left foot limited to breakdown of skin Procedures Wound #1 Pre-procedure diagnosis of Wound #1 is a Diabetic Wound/Ulcer of the Lower Extremity located on the Left T Great .Severity of Tissue Pre Debridement is: oe Fat layer exposed. There was a Excisional Skin/Subcutaneous Tissue Debridement with a total area of 1.65 sq cm performed by Ricard Dillon., MD. With the following instrument(s): Curette to remove Viable and Non-Viable tissue/material. Material removed includes Callus, Subcutaneous Tissue, Slough, and Skin: Epidermis after achieving pain control using Other (benzocaine 20% spray). No specimens were taken. A time out was conducted at 10:00, prior to the start of the procedure. A Minimum amount of bleeding was controlled with Pressure. The procedure was tolerated well with a pain level of 0 throughout and a pain level of 0 following the procedure. Post Debridement Measurements: 1.5cm length x 1.1cm width x 0.2cm depth; 0.259cm^3 volume. Character of Wound/Ulcer Post Debridement is improved. Severity of Tissue Post Debridement is: Fat layer exposed. Post procedure Diagnosis Wound #1: Same as Pre-Procedure Plan Follow-up Appointments: Return Appointment in 2 weeks. Bathing/ Shower/ Hygiene: May shower and wash wound with soap and water. Edema Control - Lymphedema / SCD / Other: Elevate legs to the level of the heart or above for 30 minutes daily and/or when sitting, a frequency of: - throughout the day Avoid standing for long periods of time. Patient to wear own compression stockings every day. - both legs Exercise  regularly Moisturize legs daily. The following medication(s) was prescribed: benzocaine topical 20 % aerosol aerosol topical for prior to debridement was prescribed at facility WOUND #1: - T Great Wound Laterality: Left oe Cleanser: Soap and Water Every Other Day/15 Days Discharge Instructions: May shower and wash wound with dial antibacterial soap and water prior to dressing change. Prim Dressing: Hydrofera Blue Ready Foam, 4x5 in Every Other Day/15 Days ary Discharge Instructions: Apply to wound bed as instructed Secondary Dressing: Woven Gauze Sponges 2x2 in Every Other Day/15 Days Discharge Instructions: Apply over primary dressing as directed. Secured With: Child psychotherapist, Sterile 2x75 (in/in) Every Other Day/15 Days Discharge Instructions: Secure with stretch gauze as directed. Secured With: Paper T ape, 1x10 (in/yd) Every Other Day/15 Days Discharge Instructions: Secure dressing with tape as directed. 1. I change the dressing to Hydrofera Blue from silver collagen #2 May need to send him back to vascular in the new year for repeat arterial studies. Electronic Signature(s) Signed: 06/10/2020 12:07:59 PM By: Linton Ham MD Entered By: Linton Ham on 06/09/2020 10:12:56 -------------------------------------------------------------------------------- SuperBill Details Patient Name: Date of Service: Micheal Quails D. 06/09/2020 Medical Record Number: [id number] Patient Account Number: [id number] Date of Birth/Sex: Treating RN: Apr 15, 1936 (84 y.o. [name] Primary Care Provider: [name] Other Clinician: Referring Provider: Treating Provider/Extender: Lynden Ang in Treatment: 9 Diagnosis Coding ICD-10 Codes Code Description [phone] Type  2 diabetes mellitus with foot ulcer E11.51 Type 2 diabetes mellitus with diabetic peripheral angiopathy without gangrene L97.521 Non-pressure chronic ulcer of other part of  left foot limited to breakdown of skin Facility Procedures CPT4 Code: [id number] Description: 92763 - DEB SUBQ TISSUE 20 SQ CM/< ICD-10 Diagnosis Description L97.521 Non-pressure chronic ulcer of other part of left foot limited to breakdown of Modifier: skin Quantity: 1 Physician Procedures : CPT4 Code Description Modifier [id number] - WC PHYS SUBQ TISS 20 SQ CM 1 ICD-10 Diagnosis Description L97.521 Non-pressure chronic ulcer of other part of left foot limited to breakdown of skin Quantity: Electronic Signature(s) Signed: 06/10/2020 12:07:59 PM By: Linton Ham MD Entered By: Linton Ham on 06/09/2020 10:13:09

## 2020-06-10 NOTE — Progress Notes (Signed)
ULISSES, VONDRAK ([id number]) Visit Report for 06/09/2020 Arrival Information Details Patient Name: Date of Service: Micheal Walls, Micheal Walls 06/09/2020 10:00 A M Medical Record Number: [id number] Patient Account Number: [id number] Date of Birth/Sex: Treating RN: April 24, 1936 (84 y.o. [name], [name] Primary Care Diamante Rubin: [name] Other Clinician: Referring Massimiliano Rohleder: Treating Demeco Ducksworth/Extender: Lynden Ang in Treatment: 9 Visit Information History Since Last Visit Added or deleted any medications: No Patient Arrived: Wheel Chair Any new allergies or adverse reactions: No Arrival Time: 09:41 Had a fall or experienced change in No Accompanied By: self activities of daily living that may affect Transfer Assistance: None risk of falls: Patient Identification Verified: Yes Signs or symptoms of abuse/neglect since last visito No Secondary Verification Process Completed: Yes Hospitalized since last visit: No Patient Requires Transmission-Based Precautions: No Implantable device outside of the clinic excluding No Patient Has Alerts: No cellular tissue based products placed in the center since last visit: Has Dressing in Place as Prescribed: Yes Pain Present Now: No Electronic Signature(s) Signed: 06/09/2020 12:40:32 PM By: Sandre Kitty Entered By: Sandre Kitty on 06/09/2020 09:43:20 -------------------------------------------------------------------------------- Encounter Discharge Information Details Patient Name: Date of Service: Micheal Quails D. 06/09/2020 10:00 A M Medical Record Number: [id number] Patient Account Number: [id number] Date of Birth/Sex: Treating RN: 28-Nov-1935 (84 y.o. Hessie Diener Primary Care Cristofer Yaffe: [name] Other Clinician: Referring Navi Erber: Treating Jere Bostrom/Extender: Lynden Ang in Treatment: 9 Encounter Discharge Information Items Post Procedure Vitals Discharge Condition:  Stable Temperature (F): 98.1 Ambulatory Status: Wheelchair Pulse (bpm): 94 Discharge Destination: Home Respiratory Rate (breaths/min): 18 Transportation: Private Auto Blood Pressure (mmHg): 146/77 Accompanied By: self Schedule Follow-up Appointment: Yes Clinical Summary of Care: Electronic Signature(s) Signed: 06/09/2020 6:26:47 PM By: Deon Pilling Entered By: Deon Pilling on 06/09/2020 10:22:38 -------------------------------------------------------------------------------- Lower Extremity Assessment Details Patient Name: Date of Service: Micheal Quails D. 06/09/2020 10:00 A M Medical Record Number: [id number] Patient Account Number: [id number] Date of Birth/Sex: Treating RN: 06-26-1935 (84 y.o. Hessie Diener Primary Care Tristram Milian: [name] Other Clinician: Referring Chiyoko Torrico: Treating Mitsuye Schrodt/Extender: Lynden Ang in Treatment: 9 Edema Assessment Assessed: Shirlyn Goltz: Yes] Patrice Paradise: No] Edema: [Left: Ye] [Right: s] Calf Left: Right: Point of Measurement: 40 cm From Medial Instep 40 cm Ankle Left: Right: Point of Measurement: 13 cm From Medial Instep 35 cm Vascular Assessment Pulses: Dorsalis Pedis Palpable: [Left:Yes] Electronic Signature(s) Signed: 06/09/2020 6:26:47 PM By: Deon Pilling Entered By: Deon Pilling on 06/09/2020 09:52:44 -------------------------------------------------------------------------------- Multi Wound Chart Details Patient Name: Date of Service: Micheal Quails D. 06/09/2020 10:00 A M Medical Record Number: [id number] Patient Account Number: [id number] Date of Birth/Sex: Treating RN: 1935-12-21 (84 y.o. [name] Primary Care Charonda Hefter: [name] Other Clinician: Referring Carnell Casamento: Treating Kinnick Maus/Extender: Lynden Ang in Treatment: 9 Vital Signs Height(in): 72 Pulse(bpm): 86 Weight(lbs): 275 Blood Pressure(mmHg): 146/77 Body Mass Index(BMI):  36 Temperature(F): 98.1 Respiratory Rate(breaths/min): 18 Photos: [1:No Photos Left T Great oe] [N/A:N/A N/A] Wound Location: [1:Blister] [N/A:N/A] Wounding Event: [1:Diabetic Wound/Ulcer of the Lower] [N/A:N/A] Primary Etiology: [1:Extremity Cataracts, Anemia, Lymphedema,] [N/A:N/A] Comorbid History: [1:Sleep Apnea, Congestive Heart Failure, Hypertension, Peripheral Arterial Disease, Peripheral Venous Disease, Type II Diabetes, End Stage Renal Disease, Gout, Neuropathy 03/27/2020] [N/A:N/A] Date Acquired: [1:9] [N/A:N/A] Weeks of Treatment: [1:Open] [N/A:N/A] Wound Status: [1:0.3x0.6x0.2] [N/A:N/A] Measurements L x W x D (cm) [1:0.141] [N/A:N/A] A (cm) : rea [1:0.028] [N/A:N/A] Volume (cm) : [1:-354.80%] [N/A:N/A] % Reduction in A [1:rea: -47.40%] [N/A:N/A] % Reduction in Volume: [1:12] Starting  Position 1 (o'clock): [1:1] Ending Position 1 (o'clock): [1:0.5] Maximum Distance 1 (cm): [1:Yes] [N/A:N/A] Undermining: [1:Grade 2] [N/A:N/A] Classification: [1:Medium] [N/A:N/A] Exudate A mount: [1:Serosanguineous] [N/A:N/A] Exudate Type: [1:red, brown] [N/A:N/A] Exudate Color: [1:Well defined, not attached] [N/A:N/A] Wound Margin: [1:Large (67-100%)] [N/A:N/A] Granulation A mount: [1:Pink, Pale] [N/A:N/A] Granulation Quality: [1:Small (1-33%)] [N/A:N/A] Necrotic A mount: [1:Fat Layer (Subcutaneous Tissue): Yes N/A] Exposed Structures: [1:Fascia: No Tendon: No Muscle: No Joint: No Bone: No Small (1-33%)] [N/A:N/A] Epithelialization: [1:Debridement - Excisional] [N/A:N/A] Debridement: Pre-procedure Verification/Time Out 10:00 [N/A:N/A] Taken: [1:Other] [N/A:N/A] Pain Control: [1:Callus, Subcutaneous, Slough] [N/A:N/A] Tissue Debrided: [1:Skin/Subcutaneous Tissue] [N/A:N/A] Level: [1:1.65] [N/A:N/A] Debridement A (sq cm): [1:rea Curette] [N/A:N/A] Instrument: [1:Minimum] [N/A:N/A] Bleeding: [1:Pressure] [N/A:N/A] Hemostasis A chieved: [1:0] [N/A:N/A] Procedural Pain: [1:0]  [N/A:N/A] Post Procedural Pain: [1:Procedure was tolerated well] [N/A:N/A] Debridement Treatment Response: [1:1.5x1.1x0.2] [N/A:N/A] Post Debridement Measurements L x W x D (cm) [1:0.259] [N/A:N/A] Post Debridement Volume: (cm) [1:Debridement] [N/A:N/A] Treatment Notes Electronic Signature(s) Signed: 06/09/2020 6:35:32 PM By: Baruch Gouty RN, BSN Signed: 06/10/2020 12:07:59 PM By: Linton Ham MD Entered By: Linton Ham on 06/09/2020 10:08:49 -------------------------------------------------------------------------------- Multi-Disciplinary Care Plan Details Patient Name: Date of Service: Micheal Quails D. 06/09/2020 10:00 A M Medical Record Number: [id number] Patient Account Number: [id number] Date of Birth/Sex: Treating RN: 1935-10-23 (84 y.o. [name] Primary Care Lashawnda Hancox: [name] Other Clinician: Referring Saadiq Poche: Treating Messi Twedt/Extender: Lynden Ang in Treatment: 9 Active Inactive Wound/Skin Impairment Nursing Diagnoses: Impaired tissue integrity Knowledge deficit related to ulceration/compromised skin integrity Goals: Patient/caregiver will verbalize understanding of skin care regimen Date Initiated: 04/03/2020 Target Resolution Date: 06/26/2020 Goal Status: Active Ulcer/skin breakdown will have a volume reduction of 50% by week 8 Date Initiated: 04/03/2020 Date Inactivated: 05/15/2020 Target Resolution Date: 05/01/2020 Goal Status: Unmet Unmet Reason: offloading issue Ulcer/skin breakdown will have a volume reduction of 80% by week 12 Date Initiated: 05/15/2020 Target Resolution Date: 06/26/2020 Goal Status: Active Interventions: Assess patient/caregiver ability to obtain necessary supplies Assess patient/caregiver ability to perform ulcer/skin care regimen upon admission and as needed Assess ulceration(s) every visit Provide education on ulcer and skin care Treatment Activities: Skin care regimen  initiated : 04/03/2020 Topical wound management initiated : 04/03/2020 Notes: Electronic Signature(s) Signed: 06/09/2020 6:35:32 PM By: Baruch Gouty RN, BSN Entered By: Baruch Gouty on 06/09/2020 10:03:51 -------------------------------------------------------------------------------- Pain Assessment Details Patient Name: Date of Service: Micheal Quails D. 06/09/2020 10:00 A M Medical Record Number: [id number] Patient Account Number: [id number] Date of Birth/Sex: Treating RN: 1935/10/23 (84 y.o. [name], [name] Primary Care Pammie Chirino: [name] Other Clinician: Referring Lanijah Warzecha: Treating Deaven Urwin/Extender: Lynden Ang in Treatment: 9 Active Problems Location of Pain Severity and Description of Pain Patient Has Paino No Site Locations Pain Management and Medication Current Pain Management: Electronic Signature(s) Signed: 06/09/2020 12:40:32 PM By: Sandre Kitty Signed: 06/10/2020 2:09:35 PM By: Rhae Hammock RN Entered By: Sandre Kitty on 06/09/2020 09:43:42 -------------------------------------------------------------------------------- Patient/Caregiver Education Details Patient Name: Date of Service: Micheal Walls, Micheal ERETT D. 12/28/2021andnbsp10:00 A M Medical Record Number: [id number] Patient Account Number: [id number] Date of Birth/Gender: Treating RN: 1935-09-24 (84 y.o. [name] Primary Care Physician: [name] Other Clinician: Referring Physician: Treating Physician/Extender: Lynden Ang in Treatment: 9 Education Assessment Education Provided To: Patient Education Topics Provided Wound/Skin Impairment: Methods: Explain/Verbal Responses: Reinforcements needed, State content correctly [employer]) Signed: 06/09/2020 6:35:32 PM By: Baruch Gouty RN, BSN Entered By: Baruch Gouty on 06/09/2020  10:04:06 -------------------------------------------------------------------------------- Wound Assessment Details Patient Name: Date of Service: Micheal Walls,  Micheal ERETT D. 06/09/2020 10:00 A M Medical Record Number: [id number] Patient Account Number: [id number] Date of Birth/Sex: Treating RN: 21-Sep-1935 (84 y.o. [name], [name] Primary Care Eliyah Mcshea: [name] Other Clinician: Referring Klaudia Beirne: Treating Zakk Borgen/Extender: Lynden Ang in Treatment: 9 Wound Status Wound Number: 1 Primary Diabetic Wound/Ulcer of the Lower Extremity Etiology: Wound Location: Left T Great oe Wound Open Wounding Event: Blister Status: Date Acquired: 03/27/2020 Comorbid Cataracts, Anemia, Lymphedema, Sleep Apnea, Congestive Heart Weeks Of Treatment: 9 History: Failure, Hypertension, Peripheral Arterial Disease, Peripheral Venous Clustered Wound: No Disease, Type II Diabetes, End Stage Renal Disease, Gout, Neuropathy Wound Measurements Length: (cm) 0.3 Width: (cm) 0.6 Depth: (cm) 0.2 Area: (cm) 0.141 Volume: (cm) 0.028 Wound Description Classification: Grade 2 Wound Margin: Well defined, not attached Exudate Amount: Medium Exudate Type: Serosanguineous Exudate Color: red, brown Foul Odor After Cleansing: N Slough/Fibrino Y % Reduction in Area: -354.8% % Reduction in Volume: -47.4% Epithelialization: Small (1-33%) Tunneling: No Undermining: Yes Starting Position (o'clock): 12 Ending Position (o'clock): 1 Maximum Distance: (cm) 0.5 o es Wound Bed Granulation Amount: Large (67-100%) Exposed Structure Granulation Quality: Pink, Pale Fascia Exposed: No Necrotic Amount: Small (1-33%) Fat Layer (Subcutaneous Tissue) Exposed: Yes Necrotic Quality: Adherent Slough Tendon Exposed: No Muscle Exposed: No Joint Exposed: No Bone Exposed: No Treatment Notes Wound #1 (Toe Great) Wound Laterality: Left Cleanser Soap and Water Discharge Instruction: May shower  and wash wound with dial antibacterial soap and water prior to dressing change. Peri-Wound Care Topical Primary Dressing Hydrofera Blue Ready Foam, 4x5 in Discharge Instruction: Apply to wound bed as instructed Secondary Dressing Woven Gauze Sponges 2x2 in Discharge Instruction: Apply over primary dressing as directed. Secured With Conforming Stretch Gauze Bandage, Sterile 2x75 (in/in) Discharge Instruction: Secure with stretch gauze as directed. Paper Tape, 1x10 (in/yd) Discharge Instruction: Secure dressing with tape as directed. Compression Wrap Compression Stockings Add-Ons Electronic Signature(s) Signed: 06/09/2020 6:26:47 PM By: Deon Pilling Signed: 06/10/2020 2:09:35 PM By: Rhae Hammock RN Entered By: Deon Pilling on 06/09/2020 09:53:10 -------------------------------------------------------------------------------- Vitals Details Patient Name: Date of Service: Micheal Quails D. 06/09/2020 10:00 A M Medical Record Number: [id number] Patient Account Number: [id number] Date of Birth/Sex: Treating RN: 07/23/1935 (84 y.o. [name], [name] Primary Care Fredi Geiler: [name] Other Clinician: Referring Annalis Kaczmarczyk: Treating Tyaisha Cullom/Extender: Lynden Ang in Treatment: 9 Vital Signs Time Taken: 09:43 Temperature (F): 98.1 Height (in): 73 Pulse (bpm): 94 Weight (lbs): 275 Respiratory Rate (breaths/min): 18 Body Mass Index (BMI): 36.3 Blood Pressure (mmHg): 146/77 Reference Range: 80 - 120 mg / dl Electronic Signature(s) Signed: 06/09/2020 12:40:32 PM By: Sandre Kitty Entered By: Sandre Kitty on 06/09/2020 09:43:35

## 2020-06-23 ENCOUNTER — Other Ambulatory Visit: Payer: Self-pay

## 2020-06-23 ENCOUNTER — Encounter (HOSPITAL_BASED_OUTPATIENT_CLINIC_OR_DEPARTMENT_OTHER): Payer: Medicare Other | Attending: Internal Medicine | Admitting: Internal Medicine

## 2020-06-23 DIAGNOSIS — Z89511 Acquired absence of right leg below knee: Secondary | ICD-10-CM | POA: Insufficient documentation

## 2020-06-23 DIAGNOSIS — N184 Chronic kidney disease, stage 4 (severe): Secondary | ICD-10-CM | POA: Insufficient documentation

## 2020-06-23 DIAGNOSIS — L97521 Non-pressure chronic ulcer of other part of left foot limited to breakdown of skin: Secondary | ICD-10-CM | POA: Diagnosis not present

## 2020-06-23 DIAGNOSIS — E11621 Type 2 diabetes mellitus with foot ulcer: Secondary | ICD-10-CM | POA: Diagnosis not present

## 2020-06-23 DIAGNOSIS — I509 Heart failure, unspecified: Secondary | ICD-10-CM | POA: Insufficient documentation

## 2020-06-23 DIAGNOSIS — E1122 Type 2 diabetes mellitus with diabetic chronic kidney disease: Secondary | ICD-10-CM | POA: Diagnosis not present

## 2020-06-23 DIAGNOSIS — I130 Hypertensive heart and chronic kidney disease with heart failure and stage 1 through stage 4 chronic kidney disease, or unspecified chronic kidney disease: Secondary | ICD-10-CM | POA: Insufficient documentation

## 2020-06-23 DIAGNOSIS — E1151 Type 2 diabetes mellitus with diabetic peripheral angiopathy without gangrene: Secondary | ICD-10-CM | POA: Diagnosis not present

## 2020-06-23 DIAGNOSIS — Z1611 Resistance to penicillins: Secondary | ICD-10-CM | POA: Diagnosis not present

## 2020-06-23 DIAGNOSIS — E1140 Type 2 diabetes mellitus with diabetic neuropathy, unspecified: Secondary | ICD-10-CM | POA: Diagnosis not present

## 2020-06-23 DIAGNOSIS — L97529 Non-pressure chronic ulcer of other part of left foot with unspecified severity: Secondary | ICD-10-CM | POA: Diagnosis not present

## 2020-06-23 NOTE — Progress Notes (Signed)
Micheal Walls ([id number]) Visit Report for 06/23/2020 Arrival Information Details Patient Name: Date of Service: Micheal Walls, Micheal Walls 06/23/2020 10:00 A M Medical Record Number: [id number] Patient Account Number: [id number] Date of Birth/Sex: Treating RN: 1936/05/29 (85 y.o. Micheal Walls Primary Care Micheal Walls: [name] Other Clinician: Referring Raney Antwine: Treating Nusaiba Guallpa/Extender: Lynden Ang in Treatment: 11 Visit Information History Since Last Visit Added or deleted any medications: No Patient Arrived: Wheel Chair Any new allergies or adverse reactions: No Arrival Time: 10:01 Had a fall or experienced change in No Accompanied By: self activities of daily living that may affect Transfer Assistance: None risk of falls: Patient Identification Verified: Yes Signs or symptoms of abuse/neglect since No Secondary Verification Process Completed: Yes last visito Patient Requires Transmission-Based Precautions: No Hospitalized since last visit: No Patient Has Alerts: No Implantable device outside of the clinic No excluding cellular tissue based products placed in the center since last visit: Has Dressing in Place as Prescribed: Yes Has Footwear/Offloading in Place as Yes Prescribed: Left: Surgical Shoe with Pressure Relief Insole Pain Present Now: No Electronic Signature(s) Signed: 06/23/2020 6:04:09 PM By: Baruch Gouty RN, BSN Entered By: Baruch Gouty on 06/23/2020 10:03:55 -------------------------------------------------------------------------------- Encounter Discharge Information Details Patient Name: Date of Service: Micheal Quails D. 06/23/2020 10:00 A M Medical Record Number: [id number] Patient Account Number: [id number] Date of Birth/Sex: Treating RN: 11-21-1935 (85 y.o. Hessie Diener Primary Care Kalley Nicholl: [name] Other Clinician: Referring Sherrise Liberto: Treating Myca Perno/Extender: Lynden Ang in Treatment: 11 Encounter Discharge Information Items Post Procedure Vitals Discharge Condition: Stable Temperature (F): 97.9 Ambulatory Status: Wheelchair Pulse (bpm): 81 Discharge Destination: Home Respiratory Rate (breaths/min): 18 Transportation: Private Auto Blood Pressure (mmHg): 142/58 Accompanied By: self Schedule Follow-up Appointment: Yes Clinical Summary of Care: Electronic Signature(s) Signed: 06/23/2020 2:51:26 PM By: Deon Pilling Entered By: Deon Pilling on 06/23/2020 11:24:50 -------------------------------------------------------------------------------- Lower Extremity Assessment Details Patient Name: Date of Service: [name], [name] 06/23/2020 10:00 A M Medical Record Number: [id number] Patient Account Number: [id number] Date of Birth/Sex: Treating RN: 1936/01/23 (85 y.o. Micheal Walls Primary Care Dquan Cortopassi: [name] Other Clinician: Referring Boston Cookson: Treating Mariyah Upshaw/Extender: Lynden Ang in Treatment: 11 Edema Assessment Assessed: Shirlyn Goltz: No] Patrice Paradise: No] Edema: [Left: Ye] [Right: s] Calf Left: Right: Point of Measurement: 40 cm From Medial Instep 42 cm Ankle Left: Right: Point of Measurement: 13 cm From Medial Instep 38.3 cm Vascular Assessment Pulses: Dorsalis Pedis Palpable: [Left:Yes] Electronic Signature(s) Signed: 06/23/2020 6:04:09 PM By: Baruch Gouty RN, BSN Entered By: Baruch Gouty on 06/23/2020 10:10:32 -------------------------------------------------------------------------------- Multi Wound Chart Details Patient Name: Date of Service: Micheal Quails D. 06/23/2020 10:00 A M Medical Record Number: [id number] Patient Account Number: [id number] Date of Birth/Sex: Treating RN: 03/25/1936 (85 y.o. [name], [name] Primary Care Ailanie Ruttan: [name] Other Clinician: Referring Choua Ikner: Treating Brycelynn Stampley/Extender: Lynden Ang in Treatment:  11 Vital Signs Height(in): 73 Capillary Blood Glucose(mg/dl): 126 Weight(lbs): 275 Pulse(bpm): 17 Body Mass Index(BMI): 36 Blood Pressure(mmHg): 142/58 Temperature(F): 97.9 Respiratory Rate(breaths/min): 18 Photos: [1:No Photos Left T Great oe] [N/A:N/A N/A] Wound Location: [1:Blister] [N/A:N/A] Wounding Event: [1:Diabetic Wound/Ulcer of the Lower] [N/A:N/A] Primary Etiology: [1:Extremity Cataracts, Anemia, Lymphedema,] [N/A:N/A] Comorbid History: [1:Sleep Apnea, Congestive Heart Failure, Hypertension, Peripheral Arterial Disease, Peripheral Venous Disease, Type II Diabetes, End Stage Renal Disease, Gout, Neuropathy 03/27/2020] [N/A:N/A] Date Acquired: [1:11] [N/A:N/A] Weeks of Treatment: [1:Open] [N/A:N/A] Wound Status: [1:0.6x0.6x0.3] [N/A:N/A] Measurements L x W x D (cm) [1:0.283] [N/A:N/A] A (cm) :  rea [1:0.085] [N/A:N/A] Volume (cm) : [1:-812.90%] [N/A:N/A] % Reduction in A [1:rea: -347.40%] [N/A:N/A] % Reduction in Volume: [1:12] Starting Position 1 (o'clock): [1:6] Ending Position 1 (o'clock): [1:0.2] Maximum Distance 1 (cm): [1:Yes] [N/A:N/A] Undermining: [1:Grade 2] [N/A:N/A] Classification: [1:Small] [N/A:N/A] Exudate A mount: [1:Serosanguineous] [N/A:N/A] Exudate Type: [1:red, brown] [N/A:N/A] Exudate Color: [1:Well defined, not attached] [N/A:N/A] Wound Margin: [1:Medium (34-66%)] [N/A:N/A] Granulation A mount: [1:Pink, Pale] [N/A:N/A] Granulation Quality: [1:Small (1-33%)] [N/A:N/A] Necrotic A mount: [1:Fat Layer (Subcutaneous Tissue): Yes N/A] Exposed Structures: [1:Bone: Yes Fascia: No Tendon: No Muscle: No Joint: No None] [N/A:N/A] Epithelialization: [1:Debridement - Excisional] [N/A:N/A] Debridement: Pre-procedure Verification/Time Out 10:31 [N/A:N/A] Taken: [1:Lidocaine] [N/A:N/A] Pain Control: [1:Bone, Subcutaneous] [N/A:N/A] Tissue Debrided: [1:Skin/Subcutaneous] [N/A:N/A] Level: [1:Tissue/Muscle/Bone 0.36] [N/A:N/A] Debridement A (sq cm): [1:rea  Curette] [N/A:N/A] Instrument: [1:Minimum] [N/A:N/A] Bleeding: [1:Pressure] [N/A:N/A] Hemostasis Achieved: [1:0] [N/A:N/A] Procedural Pain: [1:0] [N/A:N/A] Post Procedural Pain: Debridement Treatment Response: Procedure was tolerated well [N/A:N/A] Post Debridement Measurements L x 0.6x0.6x0.3 [N/A:N/A] W x D (cm) [1:0.085] [N/A:N/A] Post Debridement Volume: (cm) [1:Debridement] [N/A:N/A] Treatment Notes Electronic Signature(s) Signed: 06/23/2020 5:07:05 PM By: Linton Ham MD Signed: 06/23/2020 5:35:07 PM By: Rhae Hammock RN Entered By: Linton Ham on 06/23/2020 10:38:07 -------------------------------------------------------------------------------- Multi-Disciplinary Care Plan Details Patient Name: Date of Service: Micheal Quails D. 06/23/2020 10:00 A M Medical Record Number: [id number] Patient Account Number: [id number] Date of Birth/Sex: Treating RN: 05-30-1936 (85 y.o. [name], [name] Primary Care Kajal Scalici: [name] Other Clinician: Referring Porfiria Heinrich: Treating Maryetta Shafer/Extender: Lynden Ang in Treatment: 11 Active Inactive Wound/Skin Impairment Nursing Diagnoses: Impaired tissue integrity Knowledge deficit related to ulceration/compromised skin integrity Goals: Patient/caregiver will verbalize understanding of skin care regimen Date Initiated: 04/03/2020 Target Resolution Date: 06/26/2020 Goal Status: Active Ulcer/skin breakdown will have a volume reduction of 50% by week 8 Date Initiated: 04/03/2020 Date Inactivated: 05/15/2020 Target Resolution Date: 05/01/2020 Goal Status: Unmet Unmet Reason: offloading issue Ulcer/skin breakdown will have a volume reduction of 80% by week 12 Date Initiated: 05/15/2020 Target Resolution Date: 06/26/2020 Goal Status: Active Interventions: Assess patient/caregiver ability to obtain necessary supplies Assess patient/caregiver ability to perform ulcer/skin care regimen upon admission  and as needed Assess ulceration(s) every visit Provide education on ulcer and skin care Treatment Activities: Skin care regimen initiated : 04/03/2020 Topical wound management initiated : 04/03/2020 Notes: Electronic Signature(s) Signed: 06/23/2020 5:35:07 PM By: Rhae Hammock RN Entered By: Rhae Hammock on 06/23/2020 10:34:48 -------------------------------------------------------------------------------- Pain Assessment Details Patient Name: Date of Service: Micheal Quails D. 06/23/2020 10:00 A M Medical Record Number: [id number] Patient Account Number: [id number] Date of Birth/Sex: Treating RN: Aug 10, 1935 (85 y.o. Micheal Walls Primary Care Carma Dwiggins: [name] Other Clinician: Referring Amelio Brosky: Treating Jesselyn Rask/Extender: Lynden Ang in Treatment: 11 Active Problems Location of Pain Severity and Description of Pain Patient Has Paino No Site Locations Rate the pain. Current Pain Level: 0 Pain Management and Medication Current Pain Management: Electronic Signature(s) Signed: 06/23/2020 6:04:09 PM By: Baruch Gouty RN, BSN Entered By: Baruch Gouty on 06/23/2020 10:09:14 -------------------------------------------------------------------------------- Patient/Caregiver Education Details Patient Name: Date of Service: Micheal Walls, Micheal ERETT D. 1/11/2022andnbsp10:00 [place] Record Number: [id number] Patient Account Number: [id number] Date of Birth/Gender: Treating RN: Nov 09, 1935 (84 y.o. [name] Primary Care Physician: [name] Other Clinician: Referring Physician: Treating Physician/Extender: Lynden Ang in Treatment: 11 Education Assessment Education Provided To: Patient Education Topics Provided Wound/Skin Impairment: Methods: Explain/Verbal Responses: State content correctly [employer]) Signed: 06/23/2020 5:35:07 PM By: Rhae Hammock RN Entered By:  Hollie Salk  [name] on 06/23/2020 10:35:07 -------------------------------------------------------------------------------- Wound Assessment Details Patient Name: Date of Service: Micheal Walls, HYLAND. 06/23/2020 10:00 A M Medical Record Number: [id number] Patient Account Number: [id number] Date of Birth/Sex: Treating RN: 1935/11/23 (85 y.o. Micheal Walls Primary Care Shley Dolby: [name] Other Clinician: Referring Ellah Otte: Treating Eniyah Eastmond/Extender: Lynden Ang in Treatment: 11 Wound Status Wound Number: 1 Primary Diabetic Wound/Ulcer of the Lower Extremity Etiology: Wound Location: Left T Great oe Wound Open Wounding Event: Blister Status: Date Acquired: 03/27/2020 Comorbid Cataracts, Anemia, Lymphedema, Sleep Apnea, Congestive Heart Weeks Of Treatment: 11 History: Failure, Hypertension, Peripheral Arterial Disease, Peripheral Venous Clustered Wound: No Disease, Type II Diabetes, End Stage Renal Disease, Gout, Neuropathy Wound Measurements Length: (cm) 0.6 Width: (cm) 0.6 Depth: (cm) 0.3 Area: (cm) 0.283 Volume: (cm) 0.085 Wound Description Classification: Grade 2 Wound Margin: Well defined, not attached Exudate Amount: Small Exudate Type: Serosanguineous Exudate Color: red, brown Foul Odor After Cleansing: No Slough/Fibrino Yes % Reduction in Area: -812.9% % Reduction in Volume: -347.4% Epithelialization: None Tunneling: No Undermining: Yes Starting Position (o'clock): 12 Ending Position (o'clock): 6 Maximum Distance: (cm) 0.2 Wound Bed Granulation Amount: Medium (34-66%) Exposed Structure Granulation Quality: Pink, Pale Fascia Exposed: No Necrotic Amount: Small (1-33%) Fat Layer (Subcutaneous Tissue) Exposed: Yes Necrotic Quality: Adherent Slough Tendon Exposed: No Muscle Exposed: No Joint Exposed: No Bone Exposed: Yes Treatment Notes Wound #1 (Toe Great) Wound Laterality: Left Cleanser Soap and Water Discharge  Instruction: May shower and wash wound with dial antibacterial soap and water prior to dressing change. Peri-Wound Care Topical Primary Dressing Hydrofera Blue Ready Foam, 4x5 in Discharge Instruction: Apply to wound bed as instructed Secondary Dressing Woven Gauze Sponges 2x2 in Discharge Instruction: Apply over primary dressing as directed. Secured With Conforming Stretch Gauze Bandage, Sterile 2x75 (in/in) Discharge Instruction: Secure with stretch gauze as directed. Paper Tape, 1x10 (in/yd) Discharge Instruction: Secure dressing with tape as directed. Compression Wrap Compression Stockings Add-Ons Notes explained the orders, tests, and referral to patient. patient in agreement. Electronic Signature(s) Signed: 06/23/2020 6:04:09 PM By: Baruch Gouty RN, BSN Entered By: Baruch Gouty on 06/23/2020 10:11:29 -------------------------------------------------------------------------------- Vitals Details Patient Name: Date of Service: Micheal Quails D. 06/23/2020 10:00 A M Medical Record Number: [id number] Patient Account Number: [id number] Date of Birth/Sex: Treating RN: Oct 12, 1935 (85 y.o. Micheal Walls Primary Care Lidiya Reise: [name] Other Clinician: Referring Sye Schroepfer: Treating Abigayl Hor/Extender: Lynden Ang in Treatment: 11 Vital Signs Time Taken: 10:08 Temperature (F): 97.9 Height (in): 73 Pulse (bpm): 81 Source: Stated Respiratory Rate (breaths/min): 18 Weight (lbs): 275 Blood Pressure (mmHg): 142/58 Source: Stated Capillary Blood Glucose (mg/dl): 126 Body Mass Index (BMI): 36.3 Reference Range: 80 - 120 mg / dl Notes glucose per pt report this am Electronic Signature(s) Signed: 06/23/2020 6:04:09 PM By: Baruch Gouty RN, BSN Entered By: Baruch Gouty on 06/23/2020 10:09:06

## 2020-06-23 NOTE — Progress Notes (Signed)
Micheal Walls, Micheal Walls ([id number]) Visit Report for 06/23/2020 Debridement Details Patient Name: Date of Service: Micheal, Walls 06/23/2020 10:00 A M Medical Record Number: [id number] Patient Account Number: [id number] Date of Birth/Sex: Treating RN: Dec 03, 1935 (85 y.o. Micheal Walls, Micheal Walls Primary Care Provider: [name] Other Clinician: Referring Provider: Treating Provider/Extender: Lynden Ang in Treatment: 11 Debridement Performed for Assessment: Wound #1 Left T Great oe Performed By: Physician Ricard Dillon., MD Debridement Type: Debridement Severity of Tissue Pre Debridement: Bone involvement without necrosis Level of Consciousness (Pre-procedure): Awake and Alert Pre-procedure Verification/Time Out Yes - 10:31 Taken: Start Time: 10:31 Pain Control: Lidocaine T Area Debrided (L x W): otal 0.6 (cm) x 0.6 (cm) = 0.36 (cm) Tissue and other material debrided: Viable, Non-Viable, Bone, Subcutaneous Level: Skin/Subcutaneous Tissue/Muscle/Bone Debridement Description: Excisional Instrument: Curette Specimen: Tissue Culture Number of Specimens T aken: 1 Bleeding: Minimum Hemostasis Achieved: Pressure End Time: 10:32 Procedural Pain: 0 Post Procedural Pain: 0 Response to Treatment: Procedure was tolerated well Level of Consciousness (Post- Awake and Alert procedure): Post Debridement Measurements of Total Wound Length: (cm) 0.6 Width: (cm) 0.6 Depth: (cm) 0.3 Volume: (cm) 0.085 Character of Wound/Ulcer Post Debridement: Improved Severity of Tissue Post Debridement: Bone involvement without necrosis Post Procedure Diagnosis Same as Pre-procedure Electronic Signature(s) Signed: 06/23/2020 5:07:05 PM By: Linton Ham MD Signed: 06/23/2020 5:35:07 PM By: Rhae Hammock RN Entered By: Linton Ham on 06/23/2020 10:38:32 -------------------------------------------------------------------------------- HPI Details Patient Name: Date  of Service: Micheal Walls D. 06/23/2020 10:00 A M Medical Record Number: [id number] Patient Account Number: [id number] Date of Birth/Sex: Treating RN: 26-Dec-1935 (85 y.o. Micheal Walls Primary Care Provider: [name] Other Clinician: Referring Provider: Treating Provider/Extender: Lynden Ang in Treatment: 11 History of Present Illness HPI Description: 02/19/2020 upon evaluation today patient presents today for initial evaluation in clinic concerning issues that has been having with his bilateral lower extremities. He apparently also had a wound on his great toe. With that being said he does have a history of diabetes mellitus type 2, lymphedema, peripheral vascular disease, congestive heart failure, hypertension, right below-knee amputation, and chronic kidney disease stage IV according to records reviewed with his creatinine clearance being below 30. With that being said the legs have been actually worse than what they are currently he does not seem to have any open wounds at this point significantly he does have a small leaking area on the left leg at this point. With that being said the majority of what I see is a lot of skin changes secondary to the chronic lymphedema to be honest. There does not appear to be any signs of systemic infection or local infection based on what I see today. The patient is doing much better with regard to the wound on his toe as well according to the daughter. He has been seeing podiatry. They do have questions about whether or not he can perform physical therapy to have no limitations on physical therapy with regard to his wounds to be honest. With that being said he questions having some pain in the ankle region which again I am not really the specialist to comment on that therefore I recommended that he probably needs to discuss this with his podiatrist. 02/26/2020 upon evaluation today patient appears to actually be doing  extremely well he is completely healed and overall is looking excellent. They did get the compression stockings ordered yesterday so they should be on the way hopefully arriving if not tomorrow  than Friday. In the meantime we may just utilize a Ace wrap to try to keep the edema under as much control as possible until he gets his compression stockings. READMISSION 04/03/2021 This is an 85 year old man we had for 2 visits in the clinic in September seen by Jeri Cos. Patient has chronic venous insufficiency with secondary lymphedema. He had wounds on the left anterior leg these healed out. He is also known to Dr. Doren Custard last seen in August 2020. He is known to have an occluded PTA with monophasic waveforms has an 8 occluded tibioperoneal trunk. At the time he was not felt to be a candidate for angiography because of his stage IV chronic renal failure and his wounds were closed. If he was to require angiography he would need a CO2 angiogram. His last arterial studies were in August 2020 as well. He had triphasic waveforms down to the mid popliteal biphasic distally and then monophasic at the dorsalis of the tibioperoneal trunk, ATA occluded PTA monophasic distal PTA his peroneal was not visualized. He wears compression stockings but did not bring 1 in today He thinks the wound currently we are looking at started about 2 weeks ago. He thinks it is because of abrasion on his foot wear. He has not been wearing his shoe he switched into a left surgical sandal. He has been cleaning this with saline and wrapping it with gauze. ABI in our clinic was 0.52. Please visit previous arterial work-up by Dr. Doren Custard in August 2020 as noted. 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 We use silver collagen. He has known PAD and very significant  lymphedema. I have not ordered arterial studies or vascular consult unless this area worsens 11/12; this is a patient with a punched-out wound over the interphalangeal joint of his left great toe dorsally. He has severe PAD which is not listed is being amenable to revascularization or angiography because of his stage IV chronic renal failure. We have been using collagen 12/3; punched-out area over the inner phalangeal joint of the left great toe in the setting of known PAD. He was not felt previously to be a good revascularization candidate because of renal insufficiency. We have been using collagen. The patient states he noticed this draining about 2 to 3 days ago. When he arrived in clinic our intake nurse noted purulent drainage and wound a lot larger especially in depth of 0.7 cm 12/10; patient with a wound on the dorsal part in the inner phalangeal joint of the left great toe. This deteriorated quite a bit the last time he was here. A culture I did showed Staphylococcus lugdunensis. I put him on doxycycline and I will extend this today. X-ray showed no evidence of osteomyelitis. Really not any better today there is exposed bone at the bottom of this undermining that is hard to measure 12/17; is a patient with a wound on the dorsal part of the inter phalangeal joint of his left great toe. I have him on antibiotics and I gave him a 2-week course of doxycycline this seems to have close down the wound quite a bit and there is no longer palpable bone. 12/28; dorsal part of the interphalangeal joint of the left great toe. There is no palpable bone which is improvement. He completed antibiotics. Has been using silver collagen 1/11;  dorsal part of the interphalangeal joint of the left great toe. Again today there was exposed bone. This is not particularly new but had not been obvious over the last 2 weeks. We have been using Hydrofera Blue. X-ray I did last month was negative for osteomyelitis Electronic  Signature(s) Signed: 06/23/2020 5:07:05 PM By: Linton Ham MD Entered By: Linton Ham on 06/23/2020 10:41:44 -------------------------------------------------------------------------------- Physical Exam Details Patient Name: Date of Service: Micheal Walls D. 06/23/2020 10:00 A M Medical Record Number: [id number] Patient Account Number: [id number] Date of Birth/Sex: Treating RN: 10-Sep-1935 (85 y.o. Micheal Walls Primary Care Provider: [name] Other Clinician: Referring Provider: Treating Provider/Extender: Lynden Ang in Treatment: 11 Constitutional Sitting or standing Blood Pressure is within target range for patient.. Pulse regular and within target range for patient.Marland Kitchen Respirations regular, non-labored and within target range.. Temperature is normal and within the target range for the patient.Marland Kitchen Appears in no distress. Cardiovascular Pedal pulses are not palpable nor have they been since he has been coming here. Notes Wound exam; open wound on the anterior phalangeal joint of the left great toe. Easily exposed bone today which is a deterioration I used a #3 curette to do a bone scraping for PCR. There is no evidence of surrounding infection. I am concerned about the condition of the toe itself looks somewhat dusky at the tip especially medially Electronic Signature(s) Signed: 06/23/2020 5:07:05 PM By: Linton Ham MD Entered By: Linton Ham on 06/23/2020 10:43:14 -------------------------------------------------------------------------------- Physician Orders Details Patient Name: Date of Service: Micheal Walls D. 06/23/2020 10:00 A M Medical Record Number: [id number] Patient Account Number: [id number] Date of Birth/Sex: Treating RN: 03/11/1936 (85 y.o. Micheal Walls Primary Care Provider: [name] Other Clinician: Referring Provider: Treating Provider/Extender: Lynden Ang in Treatment:  11 Verbal / Phone Orders: No Diagnosis Coding Follow-up Appointments Return Appointment in 1 week. Bathing/ Shower/ Hygiene May shower and wash wound with soap and water. Edema Control - Lymphedema / SCD / Other Bilateral Lower Extremities Elevate legs to the level of the heart or above for 30 minutes daily and/or when sitting, a frequency of: - throughout the day Avoid standing for long periods of time. Patient to wear own compression stockings every day. - both legs Exercise regularly Moisturize legs daily. Wound Treatment Wound #1 - T Great oe Wound Laterality: Left Cleanser: Soap and Water Every Other Day/15 Days Discharge Instructions: May shower and wash wound with dial antibacterial soap and water prior to dressing change. Prim Dressing: Hydrofera Blue Ready Foam, 4x5 in Every Other Day/15 Days ary Discharge Instructions: Apply to wound bed as instructed Secondary Dressing: Woven Gauze Sponges 2x2 in Every Other Day/15 Days Discharge Instructions: Apply over primary dressing as directed. Secured With: Child psychotherapist, Sterile 2x75 (in/in) Every Other Day/15 Days Discharge Instructions: Secure with stretch gauze as directed. Secured With: Paper Tape, 1x10 (in/yd) Every Other Day/15 Days Discharge Instructions: Secure dressing with tape as directed. Consults Vascular - vascular consult ordered Laboratory naerobe culture (MICRO) - PCR culture with bone Bacteria identified in Unspecified specimen by A LOINC Code: 643-3 Convenience Name: Anerobic culture Electronic Signature(s) Signed: 06/23/2020 5:07:05 PM By: Linton Ham MD Signed: 06/23/2020 5:35:07 PM By: Rhae Hammock RN Entered By: Rhae Hammock on 06/23/2020 10:35:57 -------------------------------------------------------------------------------- Problem List Details Patient Name: Date of Service: Micheal Walls D. 06/23/2020 10:00 A M Medical Record Number: [id number] Patient Account  Number: [id number] Date of Birth/Sex: Treating RN: 01/18/1936 (85 y.o. Micheal Walls,  Micheal Walls Primary Care Provider: [name] Other Clinician: Referring Provider: Treating Provider/Extender: Lynden Ang in Treatment: 11 Active Problems ICD-10 Encounter Code Description Active Date MDM Diagnosis E11.621 Type 2 diabetes mellitus with foot ulcer 04/03/2020 No Yes E11.51 Type 2 diabetes mellitus with diabetic peripheral angiopathy without gangrene 04/03/2020 No Yes L97.521 Non-pressure chronic ulcer of other part of left foot limited to breakdown of 04/03/2020 No Yes skin Inactive Problems Resolved Problems Electronic Signature(s) Signed: 06/23/2020 5:07:05 PM By: Linton Ham MD Entered By: Linton Ham on 06/23/2020 10:37:59 -------------------------------------------------------------------------------- Progress Note Details Patient Name: Date of Service: Micheal Walls D. 06/23/2020 10:00 A M Medical Record Number: [id number] Patient Account Number: [id number] Date of Birth/Sex: Treating RN: 1935/10/02 (85 y.o. Micheal Walls, Micheal Walls Primary Care Provider: [name] Other Clinician: Referring Provider: Treating Provider/Extender: Lynden Ang in Treatment: 11 Subjective History of Present Illness (HPI) 02/19/2020 upon evaluation today patient presents today for initial evaluation in clinic concerning issues that has been having with his bilateral lower extremities. He apparently also had a wound on his great toe. With that being said he does have a history of diabetes mellitus type 2, lymphedema, peripheral vascular disease, congestive heart failure, hypertension, right below-knee amputation, and chronic kidney disease stage IV according to records reviewed with his creatinine clearance being below 30. With that being said the legs have been actually worse than what they are currently he does not seem to have any open wounds  at this point significantly he does have a small leaking area on the left leg at this point. With that being said the majority of what I see is a lot of skin changes secondary to the chronic lymphedema to be honest. There does not appear to be any signs of systemic infection or local infection based on what I see today. The patient is doing much better with regard to the wound on his toe as well according to the daughter. He has been seeing podiatry. They do have questions about whether or not he can perform physical therapy to have no limitations on physical therapy with regard to his wounds to be honest. With that being said he questions having some pain in the ankle region which again I am not really the specialist to comment on that therefore I recommended that he probably needs to discuss this with his podiatrist. 02/26/2020 upon evaluation today patient appears to actually be doing extremely well he is completely healed and overall is looking excellent. They did get the compression stockings ordered yesterday so they should be on the way hopefully arriving if not tomorrow than Friday. In the meantime we may just utilize a Ace wrap to try to keep the edema under as much control as possible until he gets his compression stockings. READMISSION 04/03/2021 This is an 85 year old man we had for 2 visits in the clinic in September seen by Jeri Cos. Patient has chronic venous insufficiency with secondary lymphedema. He had wounds on the left anterior leg these healed out. He is also known to Dr. Doren Custard last seen in August 2020. He is known to have an occluded PTA with monophasic waveforms has an 8 occluded tibioperoneal trunk. At the time he was not felt to be a candidate for angiography because of his stage IV chronic renal failure and his wounds were closed. If he was to require angiography he would need a CO2 angiogram. His last arterial studies were in August 2020 as well. He had triphasic waveforms  down to the mid popliteal biphasic distally and then monophasic at the dorsalis of the tibioperoneal trunk, ATA occluded PTA monophasic distal PTA his peroneal was not visualized. He wears compression stockings but did not bring 1 in today He thinks the wound currently we are looking at started about 2 weeks ago. He thinks it is because of abrasion on his foot wear. He has not been wearing his shoe he switched into a left surgical sandal. He has been cleaning this with saline and wrapping it with gauze. ABI in our clinic was 0.52. Please visit previous arterial work-up by Dr. Doren Custard in August 2020 as noted. 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 We use silver collagen. He has known PAD and very significant lymphedema. I have not ordered arterial studies or vascular consult unless this area worsens 11/12; this is a patient with a punched-out wound over the interphalangeal joint of his left great toe dorsally. He has severe PAD which is not listed is being amenable to revascularization or angiography because of his stage IV chronic renal failure. We have been using collagen 12/3; punched-out area over the inner phalangeal joint of the left great toe in the setting of known PAD. He was not felt previously to be a good revascularization candidate because of renal insufficiency. We have been using collagen. The patient states he noticed this draining about 2 to 3 days ago. When he arrived in clinic our intake nurse noted purulent drainage and wound a lot larger especially in depth of 0.7 cm 12/10; patient with a wound on the dorsal part in the inner phalangeal joint of the left great toe. This deteriorated quite a bit the last time he was here. A culture I did showed Staphylococcus lugdunensis. I put him on doxycycline and I will  extend this today. X-ray showed no evidence of osteomyelitis. Really not any better today there is exposed bone at the bottom of this undermining that is hard to measure 12/17; is a patient with a wound on the dorsal part of the inter phalangeal joint of his left great toe. I have him on antibiotics and I gave him a 2-week course of doxycycline this seems to have close down the wound quite a bit and there is no longer palpable bone. 12/28; dorsal part of the interphalangeal joint of the left great toe. There is no palpable bone which is improvement. He completed antibiotics. Has been using silver collagen 1/11; dorsal part of the interphalangeal joint of the left great toe. Again today there was exposed bone. This is not particularly new but had not been obvious over the last 2 weeks. We have been using Hydrofera Blue. X-ray I did last month was negative for osteomyelitis Objective Constitutional Sitting or standing Blood Pressure is within target range for patient.. Pulse regular and within target range for patient.Marland Kitchen Respirations regular, non-labored and within target range.. Temperature is normal and within the target range for the patient.Marland Kitchen Appears in no distress. Vitals Time Taken: 10:08 AM, Height: 73 in, Source: Stated, Weight: 275 lbs, Source: Stated, BMI: 36.3, Temperature: 97.9 F, Pulse: 81 bpm, Respiratory Rate: 18 breaths/min, Blood Pressure: 142/58 mmHg, Capillary Blood Glucose: 126 mg/dl. General Notes: glucose per pt report this am Cardiovascular Pedal pulses are not palpable nor have they been since he has been coming here. General  Notes: Wound exam; open wound on the anterior phalangeal joint of the left great toe. Easily exposed bone today which is a deterioration I used a #3 curette to do a bone scraping for PCR. There is no evidence of surrounding infection. I am concerned about the condition of the toe itself looks somewhat dusky at the tip especially medially Integumentary  (Hair, Skin) Wound #1 status is Open. Original cause of wound was Blister. The wound is located on the Left T Great. The wound measures 0.6cm length x 0.6cm width x oe 0.3cm depth; 0.283cm^2 area and 0.085cm^3 volume. There is bone and Fat Layer (Subcutaneous Tissue) exposed. There is no tunneling noted, however, there is undermining starting at 12:00 and ending at 6:00 with a maximum distance of 0.2cm. There is a small amount of serosanguineous drainage noted. The wound margin is well defined and not attached to the wound base. There is medium (34-66%) pink, pale granulation within the wound bed. There is a small (1-33%) amount of necrotic tissue within the wound bed including Adherent Slough. Assessment Active Problems ICD-10 Type 2 diabetes mellitus with foot ulcer Type 2 diabetes mellitus with diabetic peripheral angiopathy without gangrene Non-pressure chronic ulcer of other part of left foot limited to breakdown of skin Procedures Wound #1 Pre-procedure diagnosis of Wound #1 is a Diabetic Wound/Ulcer of the Lower Extremity located on the Left T Great .Severity of Tissue Pre Debridement is: oe Bone involvement without necrosis. There was a Excisional Skin/Subcutaneous Tissue/Muscle/Bone Debridement with a total area of 0.36 sq cm performed by Ricard Dillon., MD. With the following instrument(s): Curette to remove Viable and Non-Viable tissue/material. Material removed includes Bone,Subcutaneous Tissue and after achieving pain control using Lidocaine. 1 specimen was taken by a Tissue Culture and sent to the lab per facility protocol. A time out was conducted at 10:31, prior to the start of the procedure. A Minimum amount of bleeding was controlled with Pressure. The procedure was tolerated well with a pain level of 0 throughout and a pain level of 0 following the procedure. Post Debridement Measurements: 0.6cm length x 0.6cm width x 0.3cm depth; 0.085cm^3 volume. Character of  Wound/Ulcer Post Debridement is improved. Severity of Tissue Post Debridement is: Bone involvement without necrosis. Post procedure Diagnosis Wound #1: Same as Pre-Procedure Plan Follow-up Appointments: Return Appointment in 1 week. Bathing/ Shower/ Hygiene: May shower and wash wound with soap and water. Edema Control - Lymphedema / SCD / Other: Elevate legs to the level of the heart or above for 30 minutes daily and/or when sitting, a frequency of: - throughout the day Avoid standing for long periods of time. Patient to wear own compression stockings every day. - both legs Exercise regularly Moisturize legs daily. Laboratory ordered were: Anerobic culture - PCR culture with bone Consults ordered were: Vascular - vascular consult ordered WOUND #1: - T Great Wound Laterality: Left oe Cleanser: Soap and Water Every Other Day/15 Days Discharge Instructions: May shower and wash wound with dial antibacterial soap and water prior to dressing change. Prim Dressing: Hydrofera Blue Ready Foam, 4x5 in Every Other Day/15 Days ary Discharge Instructions: Apply to wound bed as instructed Secondary Dressing: Woven Gauze Sponges 2x2 in Every Other Day/15 Days Discharge Instructions: Apply over primary dressing as directed. Secured With: Child psychotherapist, Sterile 2x75 (in/in) Every Other Day/15 Days Discharge Instructions: Secure with stretch gauze as directed. Secured With: Paper T ape, 1x10 (in/yd) Every Other Day/15 Days Discharge Instructions: Secure dressing with tape as directed. 1.  Await PCR culture, no empiric antibiotics 2. Continue with the [facility] for now 3. Not really happy with the condition of the left great toe especially at the tip. We know he has some ongoing ischemia. I am going to refer him back to vein and vascular to have another look at this part of the equation. He has chronic renal insufficiency. He was last worked up by Dr. Doren Custard in August 2020.  ABIs were done at that point. He was not felt to be a candidate for angiography based on the chronic renal failure also the lack of a distal wound at that time. Electronic Signature(s) Signed: 06/23/2020 5:07:05 PM By: Linton Ham MD Entered By: Linton Ham on 06/23/2020 10:46:10 -------------------------------------------------------------------------------- SuperBill Details Patient Name: Date of Service: Micheal Walls D. 06/23/2020 Medical Record Number: [id number] Patient Account Number: [id number] Date of Birth/Sex: Treating RN: 1935-07-06 (85 y.o. 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

## 2020-06-30 ENCOUNTER — Encounter (HOSPITAL_BASED_OUTPATIENT_CLINIC_OR_DEPARTMENT_OTHER): Payer: Medicare Other | Admitting: Internal Medicine

## 2020-07-07 ENCOUNTER — Other Ambulatory Visit: Payer: Self-pay

## 2020-07-07 ENCOUNTER — Encounter (HOSPITAL_BASED_OUTPATIENT_CLINIC_OR_DEPARTMENT_OTHER): Payer: Medicare Other | Admitting: Internal Medicine

## 2020-07-07 DIAGNOSIS — I130 Hypertensive heart and chronic kidney disease with heart failure and stage 1 through stage 4 chronic kidney disease, or unspecified chronic kidney disease: Secondary | ICD-10-CM | POA: Diagnosis not present

## 2020-07-07 DIAGNOSIS — L97521 Non-pressure chronic ulcer of other part of left foot limited to breakdown of skin: Secondary | ICD-10-CM | POA: Diagnosis not present

## 2020-07-07 DIAGNOSIS — E1122 Type 2 diabetes mellitus with diabetic chronic kidney disease: Secondary | ICD-10-CM | POA: Diagnosis not present

## 2020-07-07 DIAGNOSIS — N184 Chronic kidney disease, stage 4 (severe): Secondary | ICD-10-CM | POA: Diagnosis not present

## 2020-07-07 DIAGNOSIS — E11621 Type 2 diabetes mellitus with foot ulcer: Secondary | ICD-10-CM | POA: Diagnosis not present

## 2020-07-07 DIAGNOSIS — E1151 Type 2 diabetes mellitus with diabetic peripheral angiopathy without gangrene: Secondary | ICD-10-CM | POA: Diagnosis not present

## 2020-07-07 DIAGNOSIS — L97528 Non-pressure chronic ulcer of other part of left foot with other specified severity: Secondary | ICD-10-CM | POA: Diagnosis not present

## 2020-07-08 NOTE — Progress Notes (Signed)
Micheal Walls, Micheal Walls ([id number]) Visit Report for 07/07/2020 Arrival Information Details Patient Name: Date of Service: Micheal Walls, Micheal Walls 07/07/2020 9:30 A M Medical Record Number: [id number] Patient Account Number: [id number] Date of Birth/Sex: Treating RN: 1936-06-12 (85 y.o. [name], [name] Primary Care Naheim Burgen: [name] Other Clinician: Referring Raquel Racey: Treating Hydia Copelin/Extender: Lynden Ang in Treatment: 2 Visit Information History Since Last Visit Added or deleted any medications: No Patient Arrived: Wheel Chair Any new allergies or adverse reactions: No Arrival Time: 09:20 Had a fall or experienced change in No Accompanied By: self activities of daily living that may affect Transfer Assistance: Manual risk of falls: Patient Identification Verified: Yes Signs or symptoms of abuse/neglect since last visito No Secondary Verification Process Completed: Yes Hospitalized since last visit: No Patient Requires Transmission-Based Precautions: No Implantable device outside of the clinic excluding No Patient Has Alerts: No cellular tissue based products placed in the center since last visit: Has Dressing in Place as Prescribed: Yes Pain Present Now: No Electronic Signature(s) Signed: 07/07/2020 11:41:47 AM By: Sandre Kitty Entered By: Sandre Kitty on 07/07/2020 09:22:19 -------------------------------------------------------------------------------- Clinic Level of Care Assessment Details Patient Name: Date of Service: Micheal Walls, Micheal Walls 07/07/2020 9:30 A M Medical Record Number: [id number] Patient Account Number: [id number] Date of Birth/Sex: Treating RN: 1935-08-29 (85 y.o. [name], [name] Primary Care Carlinda Ohlson: [name] Other Clinician: Referring Khaliya Golinski: Treating Mayline Dragon/Extender: Lynden Ang in Treatment: 13 Clinic Level of Care Assessment Items TOOL 4 Quantity Score X- 1 0 Use when  only an EandM is performed on FOLLOW-UP visit ASSESSMENTS - Nursing Assessment / Reassessment X- 1 10 Reassessment of Co-morbidities (includes updates in patient status) X- 1 5 Reassessment of Adherence to Treatment Plan ASSESSMENTS - Wound and Skin A ssessment / Reassessment X - Simple Wound Assessment / Reassessment - one wound 1 5 []  - 0 Complex Wound Assessment / Reassessment - multiple wounds X- 1 10 Dermatologic / Skin Assessment (not related to wound area) ASSESSMENTS - Focused Assessment X- 1 5 Circumferential Edema Measurements - multi extremities X- 1 10 Nutritional Assessment / Counseling / Intervention X- 1 5 Lower Extremity Assessment (monofilament, tuning fork, pulses) []  - 0 Peripheral Arterial Disease Assessment (using hand held doppler) ASSESSMENTS - Ostomy and/or Continence Assessment and Care []  - 0 Incontinence Assessment and Management []  - 0 Ostomy Care Assessment and Management (repouching, etc.) PROCESS - Coordination of Care X - Simple Patient / Family Education for ongoing care 1 15 []  - 0 Complex (extensive) Patient / Family Education for ongoing care X- 1 10 Staff obtains Programmer, systems, Records, T Results / Process Orders est []  - 0 Staff telephones HHA, Nursing Homes / Clarify orders / etc []  - 0 Routine Transfer to another Facility (non-emergent condition) []  - 0 Routine Hospital Admission (non-emergent condition) []  - 0 New Admissions / Biomedical engineer / Ordering NPWT Apligraf, etc. , []  - 0 Emergency Hospital Admission (emergent condition) X- 1 10 Simple Discharge Coordination []  - 0 Complex (extensive) Discharge Coordination PROCESS - Special Needs []  - 0 Pediatric / Minor Patient Management []  - 0 Isolation Patient Management []  - 0 Hearing / Language / Visual special needs []  - 0 Assessment of Community assistance (transportation, D/C planning, etc.) []  - 0 Additional assistance / Altered mentation []  - 0 Support  Surface(s) Assessment (bed, cushion, seat, etc.) INTERVENTIONS - Wound Cleansing / Measurement X - Simple Wound Cleansing - one wound 1 5 []  - 0 Complex Wound Cleansing - multiple  wounds X- 1 5 Wound Imaging (photographs - any number of wounds) $RemoveBe'[]'sKkuMqTWA$  - 0 Wound Tracing (instead of photographs) X- 1 5 Simple Wound Measurement - one wound $RemoveB'[]'CxmUPkIy$  - 0 Complex Wound Measurement - multiple wounds INTERVENTIONS - Wound Dressings X - Small Wound Dressing one or multiple wounds 1 10 $Re'[]'tnh$  - 0 Medium Wound Dressing one or multiple wounds $RemoveBeforeD'[]'hIzMqorlrnYmlX$  - 0 Large Wound Dressing one or multiple wounds X- 1 5 Application of Medications - topical $RemoveB'[]'PseLNadN$  - 0 Application of Medications - injection INTERVENTIONS - Miscellaneous $RemoveBeforeD'[]'JQywSiuSjupEog$  - 0 External ear exam $Remove'[]'hNGcmBj$  - 0 Specimen Collection (cultures, biopsies, blood, body fluids, etc.) $RemoveBefor'[]'ZpouFwxjuzhc$  - 0 Specimen(s) / Culture(s) sent or taken to Lab for analysis $RemoveBefo'[]'qbSIfECQngq$  - 0 Patient Transfer (multiple staff / Harrel Lemon Lift / Similar devices) $RemoveBeforeDE'[]'bJbppASRiMdcGmt$  - 0 Simple Staple / Suture removal (25 or less) $Remove'[]'yPAbxiU$  - 0 Complex Staple / Suture removal (26 or more) $Remove'[]'fCQGIWP$  - 0 Hypo / Hyperglycemic Management (close monitor of Blood Glucose) $RemoveBefore'[]'DywXyRyUArOiW$  - 0 Ankle / Brachial Index (ABI) - do not check if billed separately X- 1 5 Vital Signs Has the patient been seen at the hospital within the last three years: Yes Total Score: 120 Level Of Care: New/Established - Level 4 Electronic Signature(s) Signed: 07/08/2020 5:56:51 PM By: Rhae Hammock RN Entered By: Rhae Hammock on 07/07/2020 10:19:55 -------------------------------------------------------------------------------- Encounter Discharge Information Details Patient Name: Date of Service: Micheal Quails D. 07/07/2020 9:30 A M Medical Record Number: [id number] Patient Account Number: [id number] Date of Birth/Sex: Treating RN: May 28, 1936 (85 y.o. Hessie Diener Primary Care Darinda [place]: [name] Other Clinician: Referring Dorian Duval: Treating Landon Bassford/Extender:  Lynden Ang in Treatment: 13 Encounter Discharge Information Items Discharge Condition: Stable Ambulatory Status: Wheelchair Discharge Destination: Home Transportation: Private Auto Accompanied By: self Schedule Follow-up Appointment: Yes Clinical Summary of Care: Electronic Signature(s) Signed: 07/07/2020 6:32:06 PM By: Deon Pilling Entered By: Deon Pilling on 07/07/2020 17:41:12 -------------------------------------------------------------------------------- Lower Extremity Assessment Details Patient Name: Date of Service: Micheal Walls, Micheal Walls 07/07/2020 9:30 A M Medical Record Number: [id number] Patient Account Number: [id number] Date of Birth/Sex: Treating RN: Nov 06, 1935 (85 y.o. [name] Primary Care Exavier Lina: [name] Other Clinician: Referring Undrea Archbold: Treating Xyla Leisner/Extender: Lynden Ang in Treatment: 13 Edema Assessment Assessed: Shirlyn Goltz: No] Patrice Paradise: No] Edema: [Left: Ye] [Right: s] Calf Left: Right: Point of Measurement: 40 cm From Medial Instep 45.5 cm Ankle Left: Right: Point of Measurement: 13 cm From Medial Instep 36 cm Vascular Assessment Pulses: Dorsalis Pedis Palpable: [Left:No] Electronic Signature(s) Signed: 07/07/2020 5:42:27 PM By: Baruch Gouty RN, BSN Entered By: Baruch Gouty on 07/07/2020 09:41:28 -------------------------------------------------------------------------------- Multi Wound Chart Details Patient Name: Date of Service: Micheal Quails D. 07/07/2020 9:30 A M Medical Record Number: [id number] Patient Account Number: [id number] Date of Birth/Sex: Treating RN: 1935-07-31 (85 y.o. [name], [name] Primary Care Ellanie Oppedisano: [name] Other Clinician: Referring Sophie Quiles: Treating Brittley Regner/Extender: Lynden Ang in Treatment: 13 Vital Signs Height(in): 54 Capillary Blood Glucose(mg/dl): 130 Weight(lbs): 275 Pulse(bpm): 35 Body Mass  Index(BMI): 48 Blood Pressure(mmHg): 113/62 Temperature(F): 97.6 Respiratory Rate(breaths/min): 18 Photos: [1:No Photos Left T Great oe] [N/A:N/A N/A] Wound Location: [1:Blister] [N/A:N/A] Wounding Event: [1:Diabetic Wound/Ulcer of the Lower] [N/A:N/A] Primary Etiology: [1:Extremity Cataracts, Anemia, Lymphedema,] [N/A:N/A] Comorbid History: [1:Sleep Apnea, Congestive Heart Failure, Hypertension, Peripheral Arterial Disease, Peripheral Venous Disease, Type II Diabetes, End Stage Renal Disease, Gout, Neuropathy 03/27/2020] [N/A:N/A] Date Acquired: [1:13] [N/A:N/A] Weeks of Treatment: [1:Open] [N/A:N/A] Wound Status: [1:0.4x0.3x0.3] [N/A:N/A] Measurements L x W  x D (cm) [1:0.094] [N/A:N/A] A (cm) : rea [1:0.028] [N/A:N/A] Volume (cm) : [1:-203.20%] [N/A:N/A] % Reduction in A rea: [1:-47.40%] [N/A:N/A] % Reduction in Volume: [1:12] Starting Position 1 (o'clock): [1:12] Ending Position 1 (o'clock): [1:0.3] Maximum Distance 1 (cm): [1:Yes] [N/A:N/A] Undermining: [1:Grade 2] [N/A:N/A] Classification: [1:Small] [N/A:N/A] Exudate A mount: [1:Serosanguineous] [N/A:N/A] Exudate Type: [1:red, brown] [N/A:N/A] Exudate Color: [1:Well defined, not attached] [N/A:N/A] Wound Margin: [1:Medium (34-66%)] [N/A:N/A] Granulation A mount: [1:Red] [N/A:N/A] Granulation Quality: [1:Small (1-33%)] [N/A:N/A] Necrotic A mount: [1:Fat Layer (Subcutaneous Tissue): Yes N/A] Exposed Structures: [1:Bone: Yes Fascia: No Tendon: No Muscle: No Joint: No None] [N/A:N/A] Treatment Notes Electronic Signature(s) Signed: 07/07/2020 5:14:25 PM By: Linton Ham MD Signed: 07/08/2020 5:56:51 PM By: Rhae Hammock RN Entered By: Linton Ham on 07/07/2020 10:18:33 -------------------------------------------------------------------------------- Multi-Disciplinary Care Plan Details Patient Name: Date of Service: Micheal Quails D. 07/07/2020 9:30 A M Medical Record Number: [id number] Patient Account Number:  [id number] Date of Birth/Sex: Treating RN: [birth year] (85 y.o. [name], [name] Primary Care Zell Doucette: [name] Other Clinician: Referring Hawkins Seaman: Treating Taeshawn Helfman/Extender: Lynden Ang in Treatment: 13 Active Inactive Wound/Skin Impairment Nursing Diagnoses: Impaired tissue integrity Knowledge deficit related to ulceration/compromised skin integrity Goals: Patient/caregiver will verbalize understanding of skin care regimen Date Initiated: 04/03/2020 Target Resolution Date: 08/14/2020 Goal Status: Active Ulcer/skin breakdown will have a volume reduction of 50% by week 8 Date Initiated: 04/03/2020 Date Inactivated: 05/15/2020 Target Resolution Date: 05/01/2020 Goal Status: Unmet Unmet Reason: offloading issue Ulcer/skin breakdown will have a volume reduction of 80% by week 12 Date Initiated: 05/15/2020 Target Resolution Date: 08/14/2020 Goal Status: Active Interventions: Assess patient/caregiver ability to obtain necessary supplies Assess patient/caregiver ability to perform ulcer/skin care regimen upon admission and as needed Assess ulceration(s) every visit Provide education on ulcer and skin care Treatment Activities: Skin care regimen initiated : 04/03/2020 Topical wound management initiated : 04/03/2020 Notes: Electronic Signature(s) Signed: 07/08/2020 5:56:51 PM By: Rhae Hammock RN Entered By: Rhae Hammock on 07/07/2020 10:18:31 -------------------------------------------------------------------------------- Pain Assessment Details Patient Name: Date of Service: Micheal Quails D. 07/07/2020 9:30 A M Medical Record Number: [id number] Patient Account Number: [id number] Date of Birth/Sex: Treating RN: [birth year] (85 y.o. [name], [name] Primary Care Meagon Duskin: [name] Other Clinician: Referring Renold Kozar: Treating Shalah Estelle/Extender: Lynden Ang in Treatment: 13 Active Problems Location of  Pain Severity and Description of Pain Patient Has Paino No Site Locations Pain Management and Medication Current Pain Management: Electronic Signature(s) Signed: 07/07/2020 11:41:47 AM By: Sandre Kitty Signed: 07/08/2020 5:56:51 PM By: Rhae Hammock RN Entered By: Sandre Kitty on 07/07/2020 09:22:47 -------------------------------------------------------------------------------- Patient/Caregiver Education Details Patient Name: Date of Service: Micheal Walls 1/25/2022andnbsp9:30 A M Medical Record Number: [id number] Patient Account Number: [id number] Date of Birth/Gender: Treating RN: 20-Jun-1935 (85 y.o. Micheal Walls Primary Care Physician: [name] Other Clinician: Referring Physician: Treating Physician/Extender: Lynden Ang in Treatment: 13 Education Assessment Education Provided To: Patient Education Topics Provided Wound/Skin Impairment: Handouts: Caring for Your Ulcer Methods: Explain/Verbal Responses: State content correctly Electronic Signature(s) Signed: 07/08/2020 5:56:51 PM By: Rhae Hammock RN Entered By: Rhae Hammock on 07/07/2020 10:18:49 -------------------------------------------------------------------------------- Wound Assessment Details Patient Name: Date of Service: Micheal Quails D. 07/07/2020 9:30 A M Medical Record Number: [id number] Patient Account Number: [id number] Date of Birth/Sex: Treating RN: 1935/11/15 (85 y.o. Micheal Walls Primary Care Dail Lerew: [name] Other Clinician: Referring Nadira Single: Treating Sloka Volante/Extender: Lynden Ang in Treatment: 13 Wound Status Wound Number: 1 Primary Diabetic Wound/Ulcer of  the Lower Extremity Etiology: Wound Location: Left T Great oe Wound Open Wounding Event: Blister Status: Date Acquired: 03/27/2020 Comorbid Cataracts, Anemia, Lymphedema, Sleep Apnea, Congestive Heart Weeks Of Treatment:  13 History: Failure, Hypertension, Peripheral Arterial Disease, Peripheral Venous Clustered Wound: No Disease, Type II Diabetes, End Stage Renal Disease, Gout, Neuropathy Wound Measurements Length: (cm) 0.4 Width: (cm) 0.3 Depth: (cm) 0.3 Area: (cm) 0.094 Volume: (cm) 0.028 % Reduction in Area: -203.2% % Reduction in Volume: -47.4% Epithelialization: None Tunneling: No Undermining: Yes Starting Position (o'clock): 12 Ending Position (o'clock): 12 Maximum Distance: (cm) 0.3 Wound Description Classification: Grade 2 Wound Margin: Well defined, not attached Exudate Amount: Small Exudate Type: Serosanguineous Exudate Color: red, brown Foul Odor After Cleansing: No Slough/Fibrino Yes Wound Bed Granulation Amount: Medium (34-66%) Exposed Structure Granulation Quality: Red Fascia Exposed: No Necrotic Amount: Small (1-33%) Fat Layer (Subcutaneous Tissue) Exposed: Yes Necrotic Quality: Adherent Slough Tendon Exposed: No Muscle Exposed: No Joint Exposed: No Bone Exposed: Yes Treatment Notes Wound #1 (Toe Great) Wound Laterality: Left Cleanser Normal Saline Discharge Instruction: Cleanse the wound with Normal Saline prior to applying a clean dressing using gauze sponges, not tissue or cotton balls. Soap and Water Discharge Instruction: May shower and wash wound with dial antibacterial soap and water prior to dressing change. Peri-Wound Care Topical Primary Dressing Promogran Prisma Matrix, 4.34 (sq in) (silver collagen) Discharge Instruction: Moisten collagen with saline or hydrogel Secondary Dressing Woven Gauze Sponge, Non-Sterile 4x4 in Discharge Instruction: Apply over primary dressing as directed. Woven Gauze Sponges 2x2 in Discharge Instruction: Apply over primary dressing as directed. Secured With Conforming Stretch Gauze Bandage, Sterile 2x75 (in/in) Discharge Instruction: Secure with stretch gauze as directed. Paper Tape, 1x10 (in/yd) Discharge Instruction:  Secure dressing with tape as directed. Compression Wrap Compression Stockings Add-Ons Electronic Signature(s) Signed: 07/07/2020 5:42:27 PM By: Baruch Gouty RN, BSN Signed: 07/08/2020 5:56:51 PM By: Rhae Hammock RN Entered By: Baruch Gouty on 07/07/2020 09:46:37 -------------------------------------------------------------------------------- Vitals Details Patient Name: Date of Service: Micheal Quails D. 07/07/2020 9:30 A M Medical Record Number: [id number] Patient Account Number: [id number] Date of Birth/Sex: Treating RN: [birth year] (85 y.o. [name], [name] Primary Care Bowman Higbie: [name] Other Clinician: Referring Jurline Folger: Treating Magaby Rumberger/Extender: Lynden Ang in Treatment: 13 Vital Signs Time Taken: 09:22 Temperature (F): 97.6 Height (in): 73 Pulse (bpm): 93 Weight (lbs): 275 Respiratory Rate (breaths/min): 18 Body Mass Index (BMI): 36.3 Blood Pressure (mmHg): 113/62 Capillary Blood Glucose (mg/dl): 130 Reference Range: 80 - 120 mg / dl Electronic Signature(s) Signed: 07/07/2020 11:41:47 AM By: Sandre Kitty Entered By: Sandre Kitty on 07/07/2020 09:22:38

## 2020-07-08 NOTE — Progress Notes (Signed)
MAXIE, SLOVACEK ([id number]) Visit Report for 07/07/2020 HPI Details Patient Name: Date of Service: Micheal Walls, Micheal Walls 07/07/2020 9:30 A M Medical Record Number: [id number] Patient Account Number: [id number] Date of Birth/Sex: Treating RN: 1936/03/16 (85 y.o. [name] Primary Care Provider: [name] Other Clinician: Referring Provider: Treating Provider/Extender: Lynden Ang in Treatment: 13 History of Present Illness HPI Description: 02/19/2020 upon evaluation today patient presents today for initial evaluation in clinic concerning issues that has been having with his bilateral lower extremities. He apparently also had a wound on his great toe. With that being said he does have a history of diabetes mellitus type 2, lymphedema, peripheral vascular disease, congestive heart failure, hypertension, right below-knee amputation, and chronic kidney disease stage IV according to records reviewed with his creatinine clearance being below 30. With that being said the legs have been actually worse than what they are currently he does not seem to have any open wounds at this point significantly he does have a small leaking area on the left leg at this point. With that being said the majority of what I see is a lot of skin changes secondary to the chronic lymphedema to be honest. There does not appear to be any signs of systemic infection or local infection based on what I see today. The patient is doing much better with regard to the wound on his toe as well according to the daughter. He has been seeing podiatry. They do have questions about whether or not he can perform physical therapy to have no limitations on physical therapy with regard to his wounds to be honest. With that being said he questions having some pain in the ankle region which again I am not really the specialist to comment on that therefore I recommended that he probably needs to discuss this  with his podiatrist. 02/26/2020 upon evaluation today patient appears to actually be doing extremely well he is completely healed and overall is looking excellent. They did get the compression stockings ordered yesterday so they should be on the way hopefully arriving if not tomorrow than Friday. In the meantime we may just utilize a Ace wrap to try to keep the edema under as much control as possible until he gets his compression stockings. READMISSION 04/03/2021 This is an 85 year old man we had for 2 visits in the clinic in September seen by Jeri Cos. Patient has chronic venous insufficiency with secondary lymphedema. He had wounds on the left anterior leg these healed out. He is also known to Dr. Doren Custard last seen in August 2020. He is known to have an occluded PTA with monophasic waveforms has an 8 occluded tibioperoneal trunk. At the time he was not felt to be a candidate for angiography because of his stage IV chronic renal failure and his wounds were closed. If he was to require angiography he would need a CO2 angiogram. His last arterial studies were in August 2020 as well. He had triphasic waveforms down to the mid popliteal biphasic distally and then monophasic at the dorsalis of the tibioperoneal trunk, ATA occluded PTA monophasic distal PTA his peroneal was not visualized. He wears compression stockings but did not bring 1 in today He thinks the wound currently we are looking at started about 2 weeks ago. He thinks it is because of abrasion on his foot wear. He has not been wearing his shoe he switched into a left surgical sandal. He has been cleaning this with saline and wrapping it  with gauze. ABI in our clinic was 0.52. Please visit previous arterial work-up by Dr. Doren Custard in August 2020 as noted. 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 We use silver collagen. He has known PAD and very significant lymphedema. I have not ordered arterial studies or vascular consult unless this area worsens 11/12; this is a patient with a punched-out wound over the interphalangeal joint of his left great toe dorsally. He has severe PAD which is not listed is being amenable to revascularization or angiography because of his stage IV chronic renal failure. We have been using collagen 12/3; punched-out area over the inner phalangeal joint of the left great toe in the setting of known PAD. He was not felt previously to be a good revascularization candidate because of renal insufficiency. We have been using collagen. The patient states he noticed this draining about 2 to 3 days ago. When he arrived in clinic our intake nurse noted purulent drainage and wound a lot larger especially in depth of 0.7 cm 12/10; patient with a wound on the dorsal part in the inner phalangeal joint of the left great toe. This deteriorated quite a bit the last time he was here. A culture I did showed Staphylococcus lugdunensis. I put him on doxycycline and I will extend this today. X-ray showed no evidence of osteomyelitis. Really not any better today there is exposed bone at the bottom of this undermining that is hard to measure 12/17; is a patient with a wound on the dorsal part of the inter phalangeal joint of his left great toe. I have him on antibiotics and I gave him a 2-week course of doxycycline this seems to have close down the wound quite a bit and there is no longer palpable bone. 12/28; dorsal part of the interphalangeal joint of the left great toe. There is no palpable bone which is improvement. He completed antibiotics. Has been using silver collagen 1/11; dorsal part of the interphalangeal joint of the left great toe. Again today there was exposed bone. This is not particularly new but had not been obvious over the last 2 weeks. We have been  using Hydrofera Blue. X-ray I did last month was negative for osteomyelitis 1/25; dorsal part of the inner phalangeal joint of the left great toe. BONE SCRAPING for PCR I did last time was completely negative, no pathogens identified. We have been using Hydrofera Blue with no improvement Electronic Signature(s) Signed: 07/07/2020 5:14:25 PM By: Linton Ham MD Entered By: Linton Ham on 07/07/2020 10:21:11 -------------------------------------------------------------------------------- Physical Exam Details Patient Name: Date of Service: Micheal Quails D. 07/07/2020 9:30 A M Medical Record Number: [id number] Patient Account Number: [id number] Date of Birth/Sex: Treating RN: Jul 30, 1935 (85 y.o. [name] Primary Care Provider: [name] Other Clinician: Referring Provider: Treating Provider/Extender: Lynden Ang in Treatment: 13 Constitutional Sitting or standing Blood Pressure is within target range for patient.. Pulse regular and within target range for patient.Marland Kitchen Respirations regular, non-labored and within target range.. Temperature is normal and within the target range for the patient.Marland Kitchen Appears in no distress. Cardiovascular Pulses in the left foot are not palpable. Bilateral lymphedema. Notes Wound exam; open wound on the dorsal interphalangeal joint of the left great toe. This does not probe to bone may be slight improvement but the surface does not  look particularly healthy. I palpated carefully over the interphalangeal joint he does not complain of pain but he also has neuropathy. Electronic Signature(s) Signed: 07/07/2020 5:14:25 PM By: Linton Ham MD Entered By: Linton Ham on 07/07/2020 10:23:09 -------------------------------------------------------------------------------- Physician Orders Details Patient Name: Date of Service: Micheal Quails D. 07/07/2020 9:30 A M Medical Record Number: [id number] Patient Account  Number: [id number] Date of Birth/Sex: Treating RN: 1935-11-12 (85 y.o. [name] Primary Care Provider: [name] Other Clinician: Referring Provider: Treating Provider/Extender: Lynden Ang in Treatment: [phone] Verbal / Phone Orders: No Diagnosis Coding Follow-up Appointments Return Appointment in 1 week. Bathing/ Shower/ Hygiene May shower and wash wound with soap and water. Edema Control - Lymphedema / SCD / Other Bilateral Lower Extremities Elevate legs to the level of the heart or above for 30 minutes daily and/or when sitting, a frequency of: - throughout the day Avoid standing for long periods of time. Patient to wear own compression stockings every day. - both legs Exercise regularly Moisturize legs daily. Wound Treatment Wound #1 - T Great oe Wound Laterality: Left Cleanser: Normal Saline (DME) (Generic) Every Other Day/15 Days Discharge Instructions: Cleanse the wound with Normal Saline prior to applying a clean dressing using gauze sponges, not tissue or cotton balls. Cleanser: Soap and Water Every Other Day/15 Days Discharge Instructions: May shower and wash wound with dial antibacterial soap and water prior to dressing change. Prim Dressing: Promogran Prisma Matrix, 4.34 (sq in) (silver collagen) (DME) (Generic) Every Other Day/15 Days ary Discharge Instructions: Moisten collagen with saline or hydrogel Secondary Dressing: Woven Gauze Sponge, Non-Sterile 4x4 in (DME) (Generic) Every Other Day/15 Days Discharge Instructions: Apply over primary dressing as directed. Secondary Dressing: Woven Gauze Sponges 2x2 in (DME) (Generic) Every Other Day/15 Days Discharge Instructions: Apply over primary dressing as directed. Secured With: Child psychotherapist, Sterile 2x75 (in/in) (DME) (Generic) Every Other Day/15 Days Discharge Instructions: Secure with stretch gauze as directed. Secured With: Paper Tape, 1x10 (in/yd) (DME)  (Generic) Every Other Day/15 Days Discharge Instructions: Secure dressing with tape as directed. Electronic Signature(s) Signed: 07/07/2020 5:14:25 PM By: Linton Ham MD Signed: 07/08/2020 5:56:51 PM By: Rhae Hammock RN Entered By: Rhae Hammock on 07/07/2020 10:18:16 -------------------------------------------------------------------------------- Problem List Details Patient Name: Date of Service: Micheal Quails D. 07/07/2020 9:30 A M Medical Record Number: [id number] Patient Account Number: [id number] Date of Birth/Sex: Treating RN: Dec 27, 1935 (85 y.o. [name], [name] Primary Care Provider: [name] Other Clinician: Referring Provider: Treating Provider/Extender: Lynden Ang in Treatment: 13 Active Problems ICD-10 Encounter Code Description Active Date MDM Diagnosis E11.621 Type 2 diabetes mellitus with foot ulcer 04/03/2020 No Yes E11.51 Type 2 diabetes mellitus with diabetic peripheral angiopathy without gangrene 04/03/2020 No Yes L97.528 Non-pressure chronic ulcer of other part of left foot with other specified 07/07/2020 No Yes severity Inactive Problems Resolved Problems Electronic Signature(s) Signed: 07/07/2020 5:14:25 PM By: Linton Ham MD Entered By: Linton Ham on 07/07/2020 10:18:26 -------------------------------------------------------------------------------- Progress Note Details Patient Name: Date of Service: Micheal Quails D. 07/07/2020 9:30 A M Medical Record Number: [id number] Patient Account Number: [id number] Date of Birth/Sex: Treating RN: 01-Jan-1936 (85 y.o. [name], [name] Primary Care Provider: [name] Other Clinician: Referring Provider: Treating Provider/Extender: Lynden Ang in Treatment: 13 Subjective History of Present Illness (HPI) 02/19/2020 upon evaluation today patient presents today for initial evaluation in clinic concerning issues that has been  having with his bilateral lower extremities. He apparently also had a  wound on his great toe. With that being said he does have a history of diabetes mellitus type 2, lymphedema, peripheral vascular disease, congestive heart failure, hypertension, right below-knee amputation, and chronic kidney disease stage IV according to records reviewed with his creatinine clearance being below 30. With that being said the legs have been actually worse than what they are currently he does not seem to have any open wounds at this point significantly he does have a small leaking area on the left leg at this point. With that being said the majority of what I see is a lot of skin changes secondary to the chronic lymphedema to be honest. There does not appear to be any signs of systemic infection or local infection based on what I see today. The patient is doing much better with regard to the wound on his toe as well according to the daughter. He has been seeing podiatry. They do have questions about whether or not he can perform physical therapy to have no limitations on physical therapy with regard to his wounds to be honest. With that being said he questions having some pain in the ankle region which again I am not really the specialist to comment on that therefore I recommended that he probably needs to discuss this with his podiatrist. 02/26/2020 upon evaluation today patient appears to actually be doing extremely well he is completely healed and overall is looking excellent. They did get the compression stockings ordered yesterday so they should be on the way hopefully arriving if not tomorrow than Friday. In the meantime we may just utilize a Ace wrap to try to keep the edema under as much control as possible until he gets his compression stockings. READMISSION 04/03/2021 This is an 85 year old man we had for 2 visits in the clinic in September seen by Jeri Cos. Patient has chronic venous insufficiency with  secondary lymphedema. He had wounds on the left anterior leg these healed out. He is also known to Dr. Doren Custard last seen in August 2020. He is known to have an occluded PTA with monophasic waveforms has an 8 occluded tibioperoneal trunk. At the time he was not felt to be a candidate for angiography because of his stage IV chronic renal failure and his wounds were closed. If he was to require angiography he would need a CO2 angiogram. His last arterial studies were in August 2020 as well. He had triphasic waveforms down to the mid popliteal biphasic distally and then monophasic at the dorsalis of the tibioperoneal trunk, ATA occluded PTA monophasic distal PTA his peroneal was not visualized. He wears compression stockings but did not bring 1 in today He thinks the wound currently we are looking at started about 2 weeks ago. He thinks it is because of abrasion on his foot wear. He has not been wearing his shoe he switched into a left surgical sandal. He has been cleaning this with saline and wrapping it with gauze. ABI in our clinic was 0.52. Please visit previous arterial work-up by Dr. Doren Custard in August 2020 as noted. 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 We use silver collagen. He has known PAD and very significant lymphedema. I have not ordered arterial studies or vascular consult unless this area worsens 11/12; this is a patient with a punched-out wound over the interphalangeal  joint of his left great toe dorsally. He has severe PAD which is not listed is being amenable to revascularization or angiography because of his stage IV chronic renal failure. We have been using collagen 12/3; punched-out area over the inner phalangeal joint of the left great toe in the setting of known PAD. He was not felt previously to be a  good revascularization candidate because of renal insufficiency. We have been using collagen. The patient states he noticed this draining about 2 to 3 days ago. When he arrived in clinic our intake nurse noted purulent drainage and wound a lot larger especially in depth of 0.7 cm 12/10; patient with a wound on the dorsal part in the inner phalangeal joint of the left great toe. This deteriorated quite a bit the last time he was here. A culture I did showed Staphylococcus lugdunensis. I put him on doxycycline and I will extend this today. X-ray showed no evidence of osteomyelitis. Really not any better today there is exposed bone at the bottom of this undermining that is hard to measure 12/17; is a patient with a wound on the dorsal part of the inter phalangeal joint of his left great toe. I have him on antibiotics and I gave him a 2-week course of doxycycline this seems to have close down the wound quite a bit and there is no longer palpable bone. 12/28; dorsal part of the interphalangeal joint of the left great toe. There is no palpable bone which is improvement. He completed antibiotics. Has been using silver collagen 1/11; dorsal part of the interphalangeal joint of the left great toe. Again today there was exposed bone. This is not particularly new but had not been obvious over the last 2 weeks. We have been using Hydrofera Blue. X-ray I did last month was negative for osteomyelitis 1/25; dorsal part of the inner phalangeal joint of the left great toe. BONE SCRAPING for PCR I did last time was completely negative, no pathogens identified. We have been using Hydrofera Blue with no improvement Objective Constitutional Sitting or standing Blood Pressure is within target range for patient.. Pulse regular and within target range for patient.Marland Kitchen Respirations regular, non-labored and within target range.. Temperature is normal and within the target range for the patient.Marland Kitchen Appears in no distress. Vitals  Time Taken: 9:22 AM, Height: 73 in, Weight: 275 lbs, BMI: 36.3, Temperature: 97.6 F, Pulse: 93 bpm, Respiratory Rate: 18 breaths/min, Blood Pressure: 113/62 mmHg, Capillary Blood Glucose: 130 mg/dl. Cardiovascular Pulses in the left foot are not palpable. Bilateral lymphedema. General Notes: Wound exam; open wound on the dorsal interphalangeal joint of the left great toe. This does not probe to bone may be slight improvement but the surface does not look particularly healthy. I palpated carefully over the interphalangeal joint he does not complain of pain but he also has neuropathy. Integumentary (Hair, Skin) Wound #1 status is Open. Original cause of wound was Blister. The wound is located on the Left T Great. The wound measures 0.4cm length x 0.3cm width x oe 0.3cm depth; 0.094cm^2 area and 0.028cm^3 volume. There is bone and Fat Layer (Subcutaneous Tissue) exposed. There is no tunneling noted, however, there is undermining starting at 12:00 and ending at 12:00 with a maximum distance of 0.3cm. There is a small amount of serosanguineous drainage noted. The wound margin is well defined and not attached to the wound base. There is medium (34-66%) red granulation within the wound bed. There is a small (1-33%) amount of necrotic tissue  within the wound bed including Adherent Slough. Assessment Active Problems ICD-10 Type 2 diabetes mellitus with foot ulcer Type 2 diabetes mellitus with diabetic peripheral angiopathy without gangrene Non-pressure chronic ulcer of other part of left foot with other specified severity Plan Follow-up Appointments: Return Appointment in 1 week. Bathing/ Shower/ Hygiene: May shower and wash wound with soap and water. Edema Control - Lymphedema / SCD / Other: Elevate legs to the level of the heart or above for 30 minutes daily and/or when sitting, a frequency of: - throughout the day Avoid standing for long periods of time. Patient to wear own compression  stockings every day. - both legs Exercise regularly Moisturize legs daily. WOUND #1: - T Great Wound Laterality: Left oe Cleanser: Normal Saline (DME) (Generic) Every Other Day/15 Days Discharge Instructions: Cleanse the wound with Normal Saline prior to applying a clean dressing using gauze sponges, not tissue or cotton balls. Cleanser: Soap and Water Every Other Day/15 Days Discharge Instructions: May shower and wash wound with dial antibacterial soap and water prior to dressing change. Prim Dressing: Promogran Prisma Matrix, 4.34 (sq in) (silver collagen) (DME) (Generic) Every Other Day/15 Days ary Discharge Instructions: Moisten collagen with saline or hydrogel Secondary Dressing: Woven Gauze Sponge, Non-Sterile 4x4 in (DME) (Generic) Every Other Day/15 Days Discharge Instructions: Apply over primary dressing as directed. Secondary Dressing: Woven Gauze Sponges 2x2 in (DME) (Generic) Every Other Day/15 Days Discharge Instructions: Apply over primary dressing as directed. Secured With: Child psychotherapist, Sterile 2x75 (in/in) (DME) (Generic) Every Other Day/15 Days Discharge Instructions: Secure with stretch gauze as directed. Secured With: Paper T ape, 1x10 (in/yd) (DME) (Generic) Every Other Day/15 Days Discharge Instructions: Secure dressing with tape as directed. 1. I changed his primary dressing to moistened silver collagen change every 2 2. Has a follow-up with vascular on March 2 at our request 3. Consider an MRI without contrast of the left foot. 4. A bone scraping for PCR that I did last time was completely negative, this was surprising. I wonder today whether there is some connection with this wound with the inner phalangeal joint but I could not prove that at the bedside Electronic Signature(s) Signed: 07/07/2020 5:14:25 PM By: Linton Ham MD Entered By: Linton Ham on 07/07/2020  10:24:40 -------------------------------------------------------------------------------- SuperBill Details Patient Name: Date of Service: Micheal Quails D. 07/07/2020 Medical Record Number: [id number] Patient Account Number: [id number] Date of Birth/Sex: Treating RN: [birth year] (85 y.o. [name], [name] Primary Care Provider: [name] Other Clinician: Referring Provider: Treating Provider/Extender: Lynden Ang in Treatment: 13 Diagnosis Coding ICD-10 Codes Code Description E11.621 Type 2 diabetes mellitus with foot ulcer E11.51 Type 2 diabetes mellitus with diabetic peripheral angiopathy without gangrene L97.528 Non-pressure chronic ulcer of other part of left foot with other specified severity Facility Procedures Physician Procedures : CPT4 Code Description Modifier [id number] - WC PHYS LEVEL 4 - EST PT ICD-10 Diagnosis Description E11.621 Type 2 diabetes mellitus with foot ulcer E11.51 Type 2 diabetes mellitus with diabetic peripheral angiopathy without gangrene L97.528  Non-pressure chronic ulcer of other part of left foot with other specified severity Quantity: 1 Electronic Signature(s) Signed: 07/07/2020 5:14:25 PM By: Linton Ham MD Entered By: Linton Ham on 07/07/2020 10:25:04

## 2020-07-10 ENCOUNTER — Encounter (INDEPENDENT_AMBULATORY_CARE_PROVIDER_SITE_OTHER): Payer: Self-pay

## 2020-07-10 DIAGNOSIS — H401132 Primary open-angle glaucoma, bilateral, moderate stage: Secondary | ICD-10-CM | POA: Diagnosis not present

## 2020-07-10 LAB — HM DIABETES EYE EXAM

## 2020-07-13 ENCOUNTER — Encounter: Payer: Self-pay | Admitting: Nurse Practitioner

## 2020-07-14 ENCOUNTER — Encounter (HOSPITAL_BASED_OUTPATIENT_CLINIC_OR_DEPARTMENT_OTHER): Payer: Medicare Other | Attending: Internal Medicine | Admitting: Internal Medicine

## 2020-07-14 ENCOUNTER — Other Ambulatory Visit: Payer: Self-pay

## 2020-07-14 ENCOUNTER — Other Ambulatory Visit: Payer: Self-pay | Admitting: Nurse Practitioner

## 2020-07-14 DIAGNOSIS — N186 End stage renal disease: Secondary | ICD-10-CM | POA: Diagnosis not present

## 2020-07-14 DIAGNOSIS — E1122 Type 2 diabetes mellitus with diabetic chronic kidney disease: Secondary | ICD-10-CM | POA: Diagnosis not present

## 2020-07-14 DIAGNOSIS — E1140 Type 2 diabetes mellitus with diabetic neuropathy, unspecified: Secondary | ICD-10-CM | POA: Insufficient documentation

## 2020-07-14 DIAGNOSIS — E11621 Type 2 diabetes mellitus with foot ulcer: Secondary | ICD-10-CM | POA: Diagnosis not present

## 2020-07-14 DIAGNOSIS — I509 Heart failure, unspecified: Secondary | ICD-10-CM | POA: Diagnosis not present

## 2020-07-14 DIAGNOSIS — I872 Venous insufficiency (chronic) (peripheral): Secondary | ICD-10-CM | POA: Insufficient documentation

## 2020-07-14 DIAGNOSIS — Z833 Family history of diabetes mellitus: Secondary | ICD-10-CM | POA: Insufficient documentation

## 2020-07-14 DIAGNOSIS — Z89511 Acquired absence of right leg below knee: Secondary | ICD-10-CM | POA: Insufficient documentation

## 2020-07-14 DIAGNOSIS — E1151 Type 2 diabetes mellitus with diabetic peripheral angiopathy without gangrene: Secondary | ICD-10-CM | POA: Insufficient documentation

## 2020-07-14 DIAGNOSIS — Z8673 Personal history of transient ischemic attack (TIA), and cerebral infarction without residual deficits: Secondary | ICD-10-CM | POA: Insufficient documentation

## 2020-07-14 DIAGNOSIS — I890 Lymphedema, not elsewhere classified: Secondary | ICD-10-CM | POA: Insufficient documentation

## 2020-07-14 DIAGNOSIS — I132 Hypertensive heart and chronic kidney disease with heart failure and with stage 5 chronic kidney disease, or end stage renal disease: Secondary | ICD-10-CM | POA: Insufficient documentation

## 2020-07-14 DIAGNOSIS — Z8249 Family history of ischemic heart disease and other diseases of the circulatory system: Secondary | ICD-10-CM | POA: Diagnosis not present

## 2020-07-14 DIAGNOSIS — I739 Peripheral vascular disease, unspecified: Secondary | ICD-10-CM

## 2020-07-14 DIAGNOSIS — L97522 Non-pressure chronic ulcer of other part of left foot with fat layer exposed: Secondary | ICD-10-CM | POA: Diagnosis not present

## 2020-07-14 DIAGNOSIS — G629 Polyneuropathy, unspecified: Secondary | ICD-10-CM

## 2020-07-18 ENCOUNTER — Emergency Department (HOSPITAL_COMMUNITY): Payer: Medicare Other

## 2020-07-18 ENCOUNTER — Encounter (HOSPITAL_COMMUNITY): Payer: Self-pay

## 2020-07-18 ENCOUNTER — Inpatient Hospital Stay (HOSPITAL_COMMUNITY)
Admission: EM | Admit: 2020-07-18 | Discharge: 2020-07-22 | DRG: 871 | Disposition: A | Discharge status: Home Health | Payer: Medicare Other | Attending: Internal Medicine | Admitting: Internal Medicine

## 2020-07-18 ENCOUNTER — Other Ambulatory Visit: Payer: Self-pay

## 2020-07-18 DIAGNOSIS — N179 Acute kidney failure, unspecified: Secondary | ICD-10-CM | POA: Diagnosis not present

## 2020-07-18 DIAGNOSIS — Z794 Long term (current) use of insulin: Secondary | ICD-10-CM | POA: Diagnosis not present

## 2020-07-18 DIAGNOSIS — E113553 Type 2 diabetes mellitus with stable proliferative diabetic retinopathy, bilateral: Secondary | ICD-10-CM | POA: Diagnosis not present

## 2020-07-18 DIAGNOSIS — E785 Hyperlipidemia, unspecified: Secondary | ICD-10-CM

## 2020-07-18 DIAGNOSIS — I5032 Chronic diastolic (congestive) heart failure: Secondary | ICD-10-CM | POA: Diagnosis present

## 2020-07-18 DIAGNOSIS — R000 Tachycardia, unspecified: Secondary | ICD-10-CM | POA: Diagnosis not present

## 2020-07-18 DIAGNOSIS — R609 Edema, unspecified: Secondary | ICD-10-CM

## 2020-07-18 DIAGNOSIS — I130 Hypertensive heart and chronic kidney disease with heart failure and stage 1 through stage 4 chronic kidney disease, or unspecified chronic kidney disease: Secondary | ICD-10-CM | POA: Diagnosis not present

## 2020-07-18 DIAGNOSIS — I213 ST elevation (STEMI) myocardial infarction of unspecified site: Secondary | ICD-10-CM | POA: Diagnosis not present

## 2020-07-18 DIAGNOSIS — E11621 Type 2 diabetes mellitus with foot ulcer: Secondary | ICD-10-CM | POA: Diagnosis present

## 2020-07-18 DIAGNOSIS — E1122 Type 2 diabetes mellitus with diabetic chronic kidney disease: Secondary | ICD-10-CM | POA: Diagnosis present

## 2020-07-18 DIAGNOSIS — I6330 Cerebral infarction due to thrombosis of unspecified cerebral artery: Secondary | ICD-10-CM | POA: Insufficient documentation

## 2020-07-18 DIAGNOSIS — Z89519 Acquired absence of unspecified leg below knee: Secondary | ICD-10-CM

## 2020-07-18 DIAGNOSIS — N183 Chronic kidney disease, stage 3 unspecified: Secondary | ICD-10-CM | POA: Diagnosis not present

## 2020-07-18 DIAGNOSIS — S91102A Unspecified open wound of left great toe without damage to nail, initial encounter: Secondary | ICD-10-CM | POA: Diagnosis not present

## 2020-07-18 DIAGNOSIS — L97529 Non-pressure chronic ulcer of other part of left foot with unspecified severity: Secondary | ICD-10-CM | POA: Diagnosis present

## 2020-07-18 DIAGNOSIS — E1142 Type 2 diabetes mellitus with diabetic polyneuropathy: Secondary | ICD-10-CM | POA: Diagnosis present

## 2020-07-18 DIAGNOSIS — L0889 Other specified local infections of the skin and subcutaneous tissue: Secondary | ICD-10-CM | POA: Diagnosis not present

## 2020-07-18 DIAGNOSIS — I6509 Occlusion and stenosis of unspecified vertebral artery: Secondary | ICD-10-CM

## 2020-07-18 DIAGNOSIS — I6782 Cerebral ischemia: Secondary | ICD-10-CM | POA: Diagnosis not present

## 2020-07-18 DIAGNOSIS — Z7982 Long term (current) use of aspirin: Secondary | ICD-10-CM | POA: Diagnosis not present

## 2020-07-18 DIAGNOSIS — I6381 Other cerebral infarction due to occlusion or stenosis of small artery: Secondary | ICD-10-CM | POA: Diagnosis not present

## 2020-07-18 DIAGNOSIS — I739 Peripheral vascular disease, unspecified: Secondary | ICD-10-CM | POA: Diagnosis present

## 2020-07-18 DIAGNOSIS — N184 Chronic kidney disease, stage 4 (severe): Secondary | ICD-10-CM | POA: Diagnosis not present

## 2020-07-18 DIAGNOSIS — Z6836 Body mass index (BMI) 36.0-36.9, adult: Secondary | ICD-10-CM

## 2020-07-18 DIAGNOSIS — A419 Sepsis, unspecified organism: Secondary | ICD-10-CM | POA: Diagnosis not present

## 2020-07-18 DIAGNOSIS — M6258 Muscle wasting and atrophy, not elsewhere classified, other site: Secondary | ICD-10-CM | POA: Diagnosis not present

## 2020-07-18 DIAGNOSIS — D696 Thrombocytopenia, unspecified: Secondary | ICD-10-CM | POA: Diagnosis not present

## 2020-07-18 DIAGNOSIS — L089 Local infection of the skin and subcutaneous tissue, unspecified: Secondary | ICD-10-CM

## 2020-07-18 DIAGNOSIS — I639 Cerebral infarction, unspecified: Secondary | ICD-10-CM

## 2020-07-18 DIAGNOSIS — Z87891 Personal history of nicotine dependence: Secondary | ICD-10-CM | POA: Diagnosis not present

## 2020-07-18 DIAGNOSIS — D6959 Other secondary thrombocytopenia: Secondary | ICD-10-CM | POA: Diagnosis present

## 2020-07-18 DIAGNOSIS — L03116 Cellulitis of left lower limb: Secondary | ICD-10-CM | POA: Diagnosis not present

## 2020-07-18 DIAGNOSIS — H409 Unspecified glaucoma: Secondary | ICD-10-CM | POA: Diagnosis present

## 2020-07-18 DIAGNOSIS — K219 Gastro-esophageal reflux disease without esophagitis: Secondary | ICD-10-CM | POA: Diagnosis not present

## 2020-07-18 DIAGNOSIS — I10 Essential (primary) hypertension: Secondary | ICD-10-CM | POA: Diagnosis not present

## 2020-07-18 DIAGNOSIS — E113591 Type 2 diabetes mellitus with proliferative diabetic retinopathy without macular edema, right eye: Secondary | ICD-10-CM

## 2020-07-18 DIAGNOSIS — R6520 Severe sepsis without septic shock: Secondary | ICD-10-CM

## 2020-07-18 DIAGNOSIS — I6389 Other cerebral infarction: Secondary | ICD-10-CM | POA: Diagnosis not present

## 2020-07-18 DIAGNOSIS — I4510 Unspecified right bundle-branch block: Secondary | ICD-10-CM | POA: Diagnosis present

## 2020-07-18 DIAGNOSIS — Z79899 Other long term (current) drug therapy: Secondary | ICD-10-CM | POA: Diagnosis not present

## 2020-07-18 DIAGNOSIS — L0390 Cellulitis, unspecified: Secondary | ICD-10-CM | POA: Diagnosis not present

## 2020-07-18 DIAGNOSIS — E782 Mixed hyperlipidemia: Secondary | ICD-10-CM | POA: Diagnosis present

## 2020-07-18 DIAGNOSIS — R600 Localized edema: Secondary | ICD-10-CM | POA: Diagnosis not present

## 2020-07-18 DIAGNOSIS — M1A40X0 Other secondary chronic gout, unspecified site, without tophus (tophi): Secondary | ICD-10-CM | POA: Diagnosis not present

## 2020-07-18 DIAGNOSIS — L97519 Non-pressure chronic ulcer of other part of right foot with unspecified severity: Secondary | ICD-10-CM

## 2020-07-18 DIAGNOSIS — E11628 Type 2 diabetes mellitus with other skin complications: Secondary | ICD-10-CM

## 2020-07-18 DIAGNOSIS — Z89511 Acquired absence of right leg below knee: Secondary | ICD-10-CM

## 2020-07-18 DIAGNOSIS — E1151 Type 2 diabetes mellitus with diabetic peripheral angiopathy without gangrene: Secondary | ICD-10-CM | POA: Diagnosis present

## 2020-07-18 DIAGNOSIS — Z20822 Contact with and (suspected) exposure to covid-19: Secondary | ICD-10-CM | POA: Diagnosis present

## 2020-07-18 DIAGNOSIS — I5030 Unspecified diastolic (congestive) heart failure: Secondary | ICD-10-CM | POA: Diagnosis not present

## 2020-07-18 DIAGNOSIS — R29810 Facial weakness: Secondary | ICD-10-CM | POA: Diagnosis not present

## 2020-07-18 DIAGNOSIS — R4781 Slurred speech: Secondary | ICD-10-CM | POA: Diagnosis not present

## 2020-07-18 DIAGNOSIS — E669 Obesity, unspecified: Secondary | ICD-10-CM | POA: Diagnosis present

## 2020-07-18 DIAGNOSIS — M109 Gout, unspecified: Secondary | ICD-10-CM | POA: Diagnosis not present

## 2020-07-18 DIAGNOSIS — R4182 Altered mental status, unspecified: Secondary | ICD-10-CM | POA: Diagnosis not present

## 2020-07-18 DIAGNOSIS — I6501 Occlusion and stenosis of right vertebral artery: Secondary | ICD-10-CM

## 2020-07-18 DIAGNOSIS — M86679 Other chronic osteomyelitis, unspecified ankle and foot: Secondary | ICD-10-CM | POA: Diagnosis not present

## 2020-07-18 DIAGNOSIS — L97522 Non-pressure chronic ulcer of other part of left foot with fat layer exposed: Secondary | ICD-10-CM | POA: Diagnosis present

## 2020-07-18 DIAGNOSIS — G4733 Obstructive sleep apnea (adult) (pediatric): Secondary | ICD-10-CM | POA: Diagnosis present

## 2020-07-18 DIAGNOSIS — M86172 Other acute osteomyelitis, left ankle and foot: Secondary | ICD-10-CM | POA: Diagnosis not present

## 2020-07-18 DIAGNOSIS — E1165 Type 2 diabetes mellitus with hyperglycemia: Secondary | ICD-10-CM | POA: Diagnosis not present

## 2020-07-18 DIAGNOSIS — R29818 Other symptoms and signs involving the nervous system: Secondary | ICD-10-CM | POA: Diagnosis not present

## 2020-07-18 DIAGNOSIS — R0902 Hypoxemia: Secondary | ICD-10-CM | POA: Diagnosis not present

## 2020-07-18 LAB — COMPREHENSIVE METABOLIC PANEL
ALT: 20 U/L (ref 0–44)
AST: 27 U/L (ref 15–41)
Albumin: 3.7 g/dL (ref 3.5–5.0)
Alkaline Phosphatase: 52 U/L (ref 38–126)
Anion gap: 12 (ref 5–15)
BUN: 66 mg/dL — ABNORMAL HIGH (ref 8–23)
CO2: 24 mmol/L (ref 22–32)
Calcium: 10.2 mg/dL (ref 8.9–10.3)
Chloride: 103 mmol/L (ref 98–111)
Creatinine, Ser: 2.65 mg/dL — ABNORMAL HIGH (ref 0.61–1.24)
GFR, Estimated: 23 mL/min — ABNORMAL LOW (ref >60)
Glucose, Bld: 187 mg/dL — ABNORMAL HIGH (ref 70–99)
Potassium: 4 mmol/L (ref 3.5–5.1)
Sodium: 139 mmol/L (ref 135–145)
Total Bilirubin: 1.1 mg/dL (ref 0.3–1.2)
Total Protein: 7 g/dL (ref 6.5–8.1)

## 2020-07-18 LAB — URINALYSIS, ROUTINE W REFLEX MICROSCOPIC
Bilirubin Urine: NEGATIVE
Glucose, UA: NEGATIVE mg/dL
Hgb urine dipstick: NEGATIVE
Ketones, ur: NEGATIVE mg/dL
Leukocytes,Ua: NEGATIVE
Nitrite: NEGATIVE
Protein, ur: NEGATIVE mg/dL
Specific Gravity, Urine: 1.008 (ref 1.005–1.030)
pH: 6 (ref 5.0–8.0)

## 2020-07-18 LAB — CBC WITH DIFFERENTIAL/PLATELET
Abs Immature Granulocytes: 0.06 10*3/uL (ref 0.00–0.07)
Basophils Absolute: 0 10*3/uL (ref 0.0–0.1)
Basophils Relative: 0 %
Eosinophils Absolute: 0 10*3/uL (ref 0.0–0.5)
Eosinophils Relative: 0 %
HCT: 34.5 % — ABNORMAL LOW (ref 39.0–52.0)
Hemoglobin: 11.4 g/dL — ABNORMAL LOW (ref 13.0–17.0)
Immature Granulocytes: 1 %
Lymphocytes Relative: 4 %
Lymphs Abs: 0.5 10*3/uL — ABNORMAL LOW (ref 0.7–4.0)
MCH: 31.4 pg (ref 26.0–34.0)
MCHC: 33 g/dL (ref 30.0–36.0)
MCV: 95 fL (ref 80.0–100.0)
Monocytes Absolute: 0.9 10*3/uL (ref 0.1–1.0)
Monocytes Relative: 7 %
Neutro Abs: 10.3 10*3/uL — ABNORMAL HIGH (ref 1.7–7.7)
Neutrophils Relative %: 88 %
Platelets: 123 10*3/uL — ABNORMAL LOW (ref 150–400)
RBC: 3.63 MIL/uL — ABNORMAL LOW (ref 4.22–5.81)
RDW: 17 % — ABNORMAL HIGH (ref 11.5–15.5)
WBC: 11.8 10*3/uL — ABNORMAL HIGH (ref 4.0–10.5)
nRBC: 0 % (ref 0.0–0.2)

## 2020-07-18 LAB — PROTIME-INR
INR: 1.2 (ref 0.8–1.2)
Prothrombin Time: 14.4 seconds (ref 11.4–15.2)

## 2020-07-18 LAB — APTT: aPTT: <20 seconds — ABNORMAL LOW (ref 24–36)

## 2020-07-18 LAB — LACTIC ACID, PLASMA
Lactic Acid, Venous: 2.4 mmol/L (ref 0.5–1.9)
Lactic Acid, Venous: 1.2 mmol/L (ref 0.5–1.9)

## 2020-07-18 LAB — SARS CORONAVIRUS 2 BY RT PCR (HOSPITAL ORDER, PERFORMED IN ~~LOC~~ HOSPITAL LAB): SARS Coronavirus 2: NEGATIVE

## 2020-07-18 IMAGING — MR MR HEAD WO/W CM
13 of 14 series · 37 of 48 positions shown · IV contrast (gadavist)
Comparison: Same day head CT.
COMPARISON: Same day head CT.

Addendum:
CLINICAL DATA: Neuro deficit, acute stroke suspected.

EXAM:
MRI HEAD WITHOUT CONTRAST
MRA HEAD WITHOUT CONTRAST
TECHNIQUE: Multiplanar, multiecho pulse sequences of the brain and surrounding
structures were obtained without intravenous contrast. Angiographic
images of the Circle of Willis were obtained using MRA technique
without intravenous contrast.
CONTRAST:  10mL GADAVIST GADOBUTROL 1 MMOL/ML IV SOLN

[Series 5: DWI · axial · 3.0mm · 1.36mm/px · z∈[-26,+128]mm · 7 of 108 slices shown (1 of 4)]
[im 1/108]
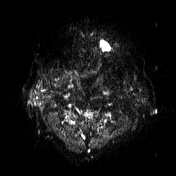
[im 18/108]
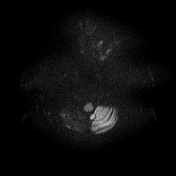
[im 36/108]
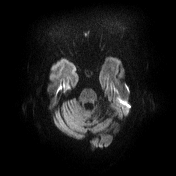
[im 54/108]
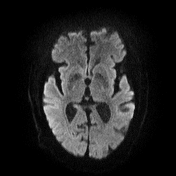
[im 72/108]
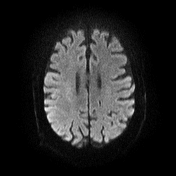
[im 90/108]
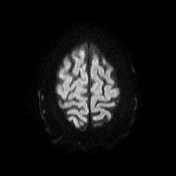
[im 108/108]
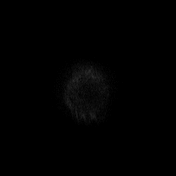

[Series 6: DWI · axial · 3.0mm · 1.36mm/px · z∈[-26,+128]mm · 3 of 54 slices shown (2 of 4)]
[im 1/54]
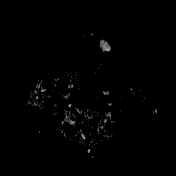
[im 27/54]
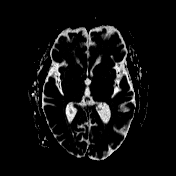
[im 54/54]
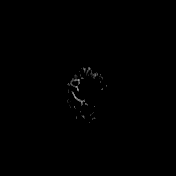

[Series 7: T1 · sagittal · 5.0mm · 0.78mm/px · 2 of 26 slices shown (1 of 2)]
[im 1/26]
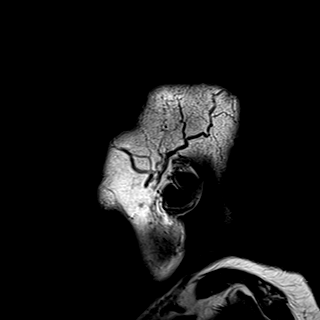
[im 26/26]
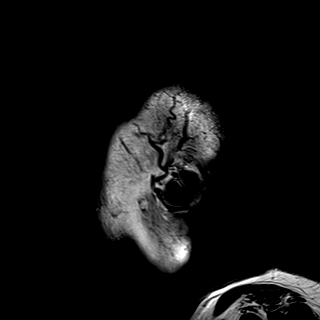

[Series 8: T2 · axial · 5.0mm · 0.62mm/px · z∈[-28,+129]mm · 2 of 26 slices shown]
[im 1/26]
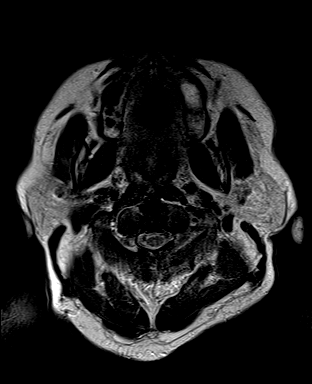
[im 26/26]
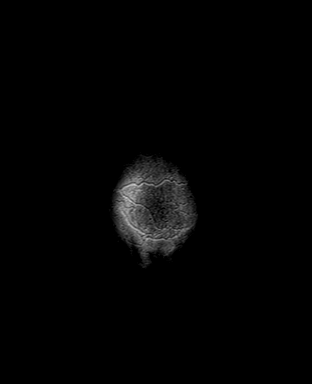

[Series 9: GRE · axial · 3.0mm · 0.45mm/px · z∈[-31,+129]mm · 3 of 56 slices shown]
[im 1/56]
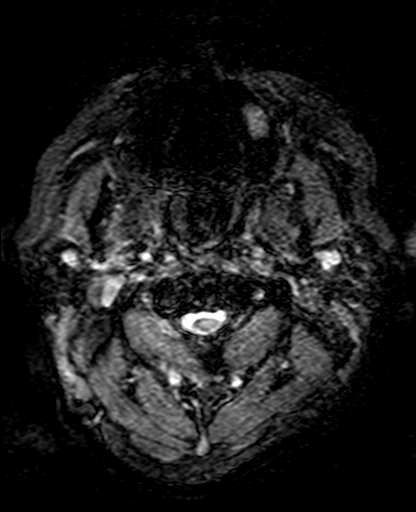
[im 28/56]
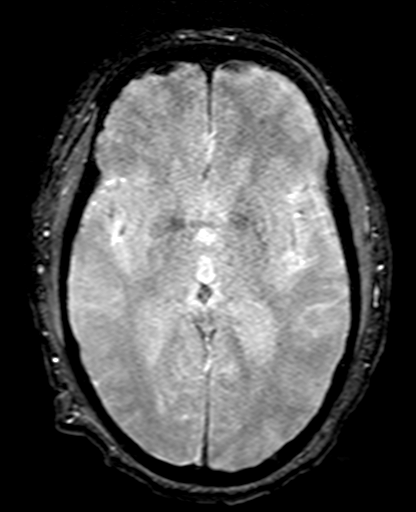
[im 56/56]
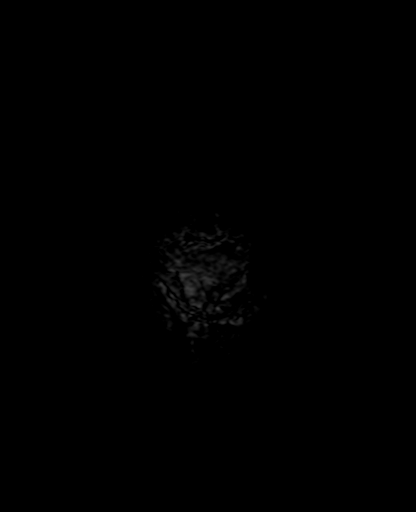

[Series 10: FLAIR · axial · 3.0mm · 0.86mm/px · z∈[-32,+127]mm · 3 of 56 slices shown]
[im 1/56]
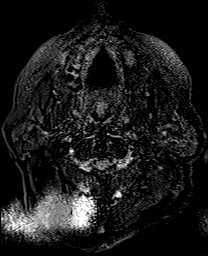
[im 28/56]
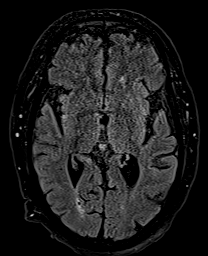
[im 56/56]
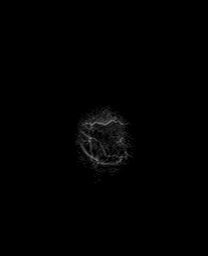

[Series 15: T1 · axial · 3.0mm · 0.45mm/px · z∈[-31,+129]mm · 3 of 56 slices shown (2 of 2)]
[im 1/56]
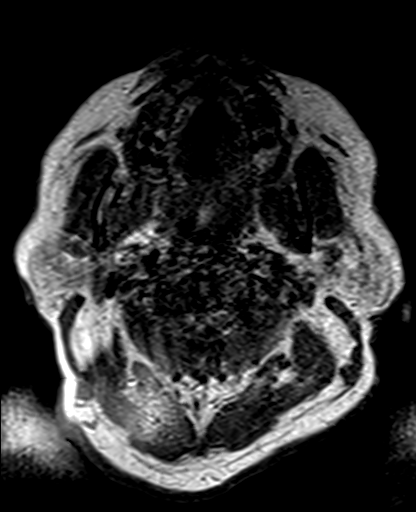
[im 28/56]
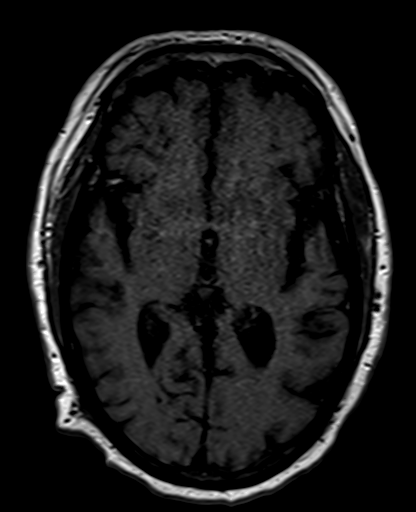
[im 56/56]
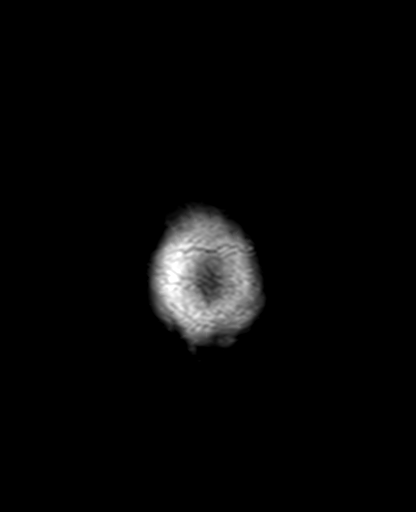

[Series 16: DWI · coronal · 5.0mm · 1.31mm/px · 4 of 64 slices shown (3 of 4)]
[im 1/64]
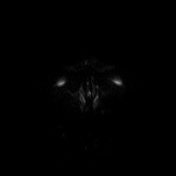
[im 22/64]
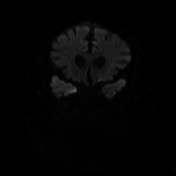
[im 43/64]
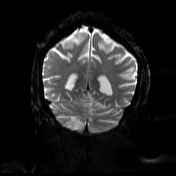
[im 64/64]
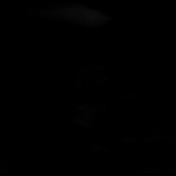

[Series 17: DWI · coronal · 5.0mm · 1.31mm/px · 2 of 32 slices shown (4 of 4)]
[im 1/32]
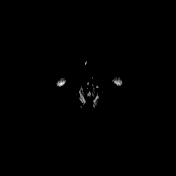
[im 32/32]
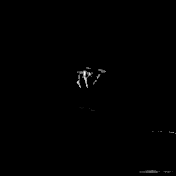

[Series 18: T2 post-contrast · coronal · 5.0mm · 0.86mm/px · 2 of 32 slices shown]
[im 1/32]
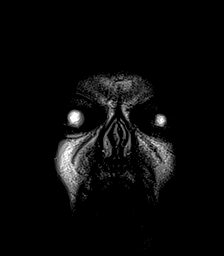
[im 32/32]
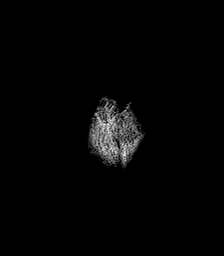

[Series 19: T1 post-contrast · axial · 3.0mm · 0.45mm/px · z∈[-30,+127]mm · 3 of 55 slices shown (1 of 3)]
[im 1/55]
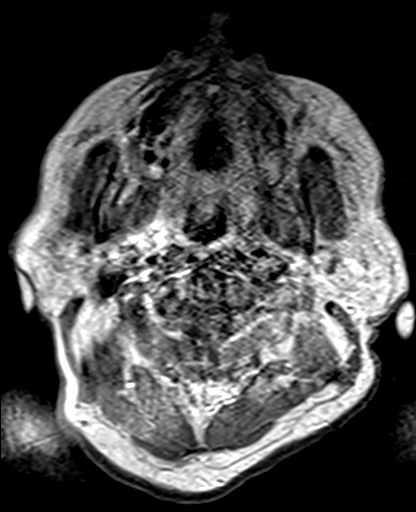
[im 28/55]
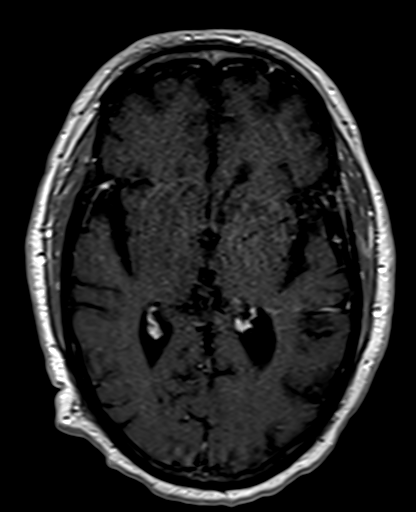
[im 55/55]
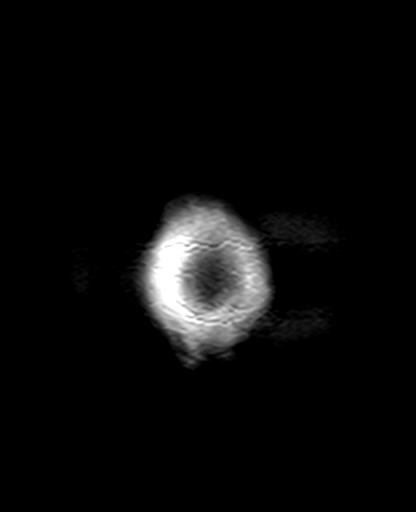

[Series 20: T1 post-contrast · coronal · 5.0mm · 0.43mm/px · 2 of 32 slices shown (2 of 3)]
[im 1/32]
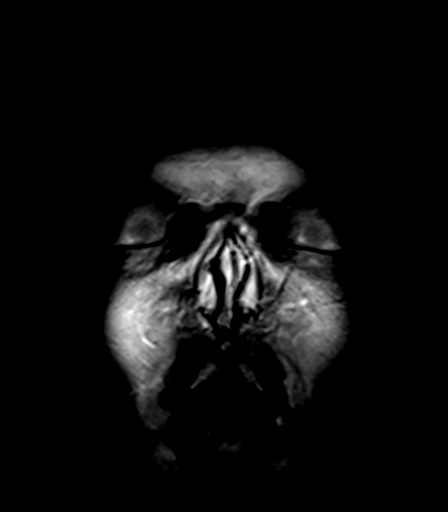
[im 32/32]
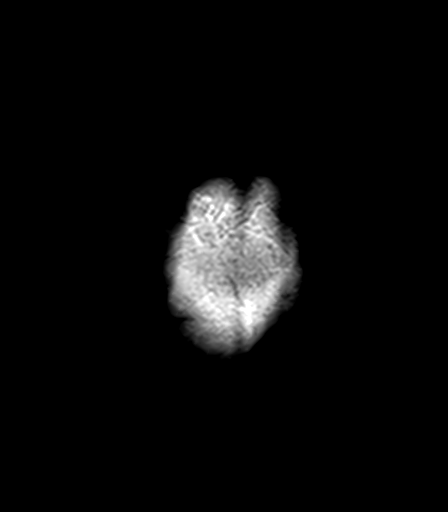

[Series 21: T1 post-contrast · sagittal · 5.0mm · 0.98mm/px · 1 of 24 slices shown (3 of 3)]
[im 1/24]
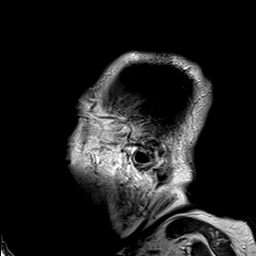

[37 of 48 positions shown; findings below may reference images not displayed]

FINDINGS: MRI HEAD FINDINGS

Brain: Remote large bilateral cerebellar infarcts with
encephalomalacia. Remote right paramidline pontine infarct. Small
area of DWI hyperintensity without definite ADC correlate in the
right frontal lobe. Mild associated edema without mass effect.
Additional T2/FLAIR hyperintensities within the white matter, mild
for age in likely related to chronic microvascular ischemic disease.
No acute hemorrhage. No hydrocephalus. No mass lesion or abnormal
mass effect. No extra-axial fluid collection. Moderate cerebral
atrophy.

Vascular: Poor flow void within the imaged intradural right
vertebral artery

Skull and upper cervical spine: Diffusely T1 hypointense marrow
signal in the upper cervical spine.

Sinuses/Orbits: Left maxillary sinus mucosal thickening and mild
mucosal thickening of scattered ethmoid air cells. No air-fluid
levels. Unremarkable orbits.

Other: No mastoid effusions.

MRA HEAD FINDINGS

Significantly limited MRA due to patient motion. Within this
limitation:

Anterior circulation: Bilateral internal carotid arteries are patent
through the carotid siphons. Bilateral M1 and proximal M2 MCA
branches are patent without evidence of hemodynamically significant
proximal stenosis. Bilateral ACAs are patent. No visible aneurysm.

Posterior circulation: The intradural vertebral arteries are poorly
visualized due to motion; however, there is suspected severe
stenosis or occlusion of the right distal intradural vertebral
artery and possible left intradural vertebral artery stenosis.
Diminutive vertebrobasilar system with bilateral fetal type PCAs.
There is suspected mild stenosis of the basilar artery. Bilateral
PCAs are patent without evidence of hemodynamically significant
proximal stenosis. No visible aneurysm.
IMPRESSION: MRI head:

1. Small area of DWI hyperintensity without definite ADC correlate
in the right frontal lobe, either T2 shine through/artifact or
subacute infarct.
2. Remote large bilateral cerebellar infarcts and a remote pontine
infarct.
3. Diffusely T1 hypointense marrow signal in the upper cervical
spine, which is nonspecific and could relate to chronic anemia,
chronic hypoxia (such as in smokers), or a lymphoproliferative
disorder.

MRA:

1. Significantly limited MRA secondary to patient motion.
2. The intradural vertebral arteries are poorly visualized; however,
there is suspected severe stenosis or occlusion of the right distal
intradural vertebral artery and possible left vertebral artery
stenosis. A neck CTA could further characterize if clinically
indicated.
3. Diminutive vertebrobasilar system with bilateral fetal type PCAs
and likely mild narrowing of the basilar artery.
4. No large vessel occlusion or evidence of hemodynamically
significant proximal stenosis in the anterior circulation

ADDENDUM:
On further review, while limited by motion there appears to be
absent flow related signal within the right intradural vertebral
artery except for distally, concerning for age-indeterminant
occlusion or high-grade stenosis of the proximal right vertebral
artery. CTA could further characterize if clinically indicated.

Findings in the report and addendum were discussed with TIGER, PA
via telephone at [DATE] via telephone.

*** End of Addendum ***
FINDINGS: MRI HEAD FINDINGS

Brain: Remote large bilateral cerebellar infarcts with
encephalomalacia. Remote right paramidline pontine infarct. Small
area of DWI hyperintensity without definite ADC correlate in the
right frontal lobe. Mild associated edema without mass effect.
Additional T2/FLAIR hyperintensities within the white matter, mild
for age in likely related to chronic microvascular ischemic disease.
No acute hemorrhage. No hydrocephalus. No mass lesion or abnormal
mass effect. No extra-axial fluid collection. Moderate cerebral
atrophy.

Vascular: Poor flow void within the imaged intradural right
vertebral artery

Skull and upper cervical spine: Diffusely T1 hypointense marrow
signal in the upper cervical spine.

Sinuses/Orbits: Left maxillary sinus mucosal thickening and mild
mucosal thickening of scattered ethmoid air cells. No air-fluid
levels. Unremarkable orbits.

Other: No mastoid effusions.

MRA HEAD FINDINGS

Significantly limited MRA due to patient motion. Within this
limitation:

Anterior circulation: Bilateral internal carotid arteries are patent
through the carotid siphons. Bilateral M1 and proximal M2 MCA
branches are patent without evidence of hemodynamically significant
proximal stenosis. Bilateral ACAs are patent. No visible aneurysm.

Posterior circulation: The intradural vertebral arteries are poorly
visualized due to motion; however, there is suspected severe
stenosis or occlusion of the right distal intradural vertebral
artery and possible left intradural vertebral artery stenosis.
Diminutive vertebrobasilar system with bilateral fetal type PCAs.
There is suspected mild stenosis of the basilar artery. Bilateral
PCAs are patent without evidence of hemodynamically significant
proximal stenosis. No visible aneurysm.
IMPRESSION: MRI head:

1. Small area of DWI hyperintensity without definite ADC correlate
in the right frontal lobe, either T2 shine through/artifact or
subacute infarct.
2. Remote large bilateral cerebellar infarcts and a remote pontine
infarct.
3. Diffusely T1 hypointense marrow signal in the upper cervical
spine, which is nonspecific and could relate to chronic anemia,
chronic hypoxia (such as in smokers), or a lymphoproliferative
disorder.

MRA:

1. Significantly limited MRA secondary to patient motion.
2. The intradural vertebral arteries are poorly visualized; however,
there is suspected severe stenosis or occlusion of the right distal
intradural vertebral artery and possible left vertebral artery
stenosis. A neck CTA could further characterize if clinically
indicated.
3. Diminutive vertebrobasilar system with bilateral fetal type PCAs
and likely mild narrowing of the basilar artery.
4. No large vessel occlusion or evidence of hemodynamically
significant proximal stenosis in the anterior circulation

## 2020-07-18 IMAGING — CT CT HEAD W/O CM
3 series · 14 of 47 positions shown, 16 images · non-contrast
Comparison: None.

CLINICAL DATA: Mental status change.

EXAM:
CT HEAD WITHOUT CONTRAST
TECHNIQUE: Contiguous axial images were obtained from the base of the skull
through the vertex without intravenous contrast.

[Series 2: head wo · axial · 0.47mm/px · z∈[-192,-57]mm · 8 of 33 slices shown, 10 images]
[im 3/33  brain]
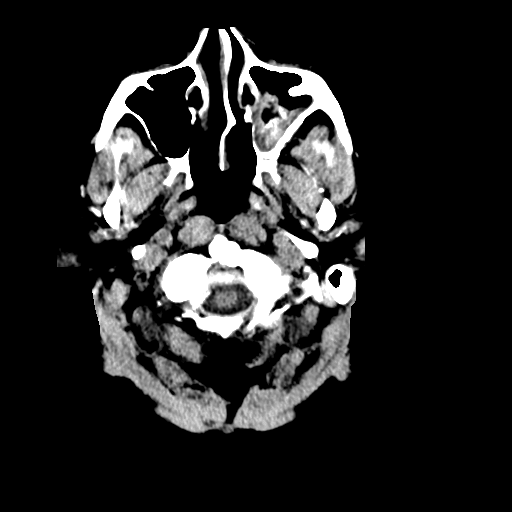
[im 3/33  bone]
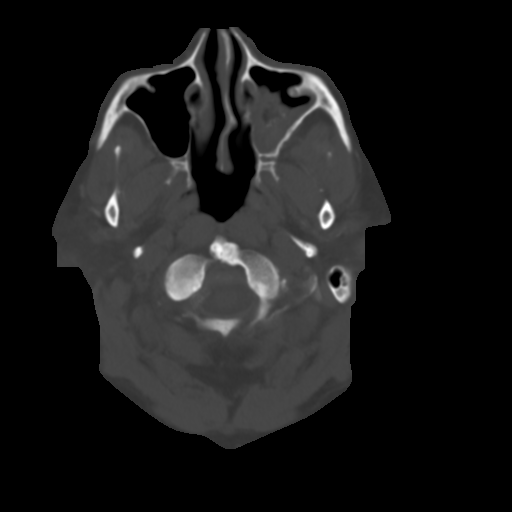
[im 7/33  brain]
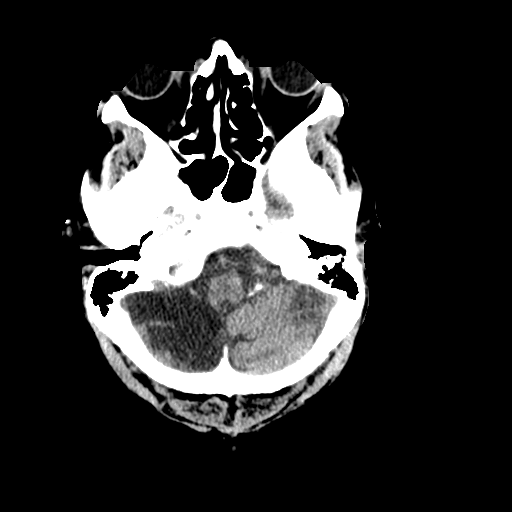
[im 10/33  brain]
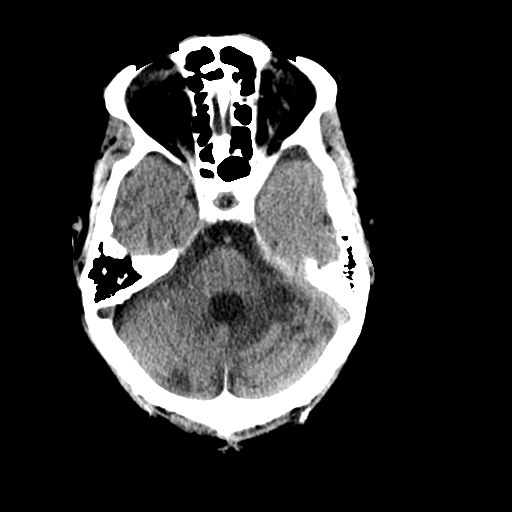
[im 15/33  brain]
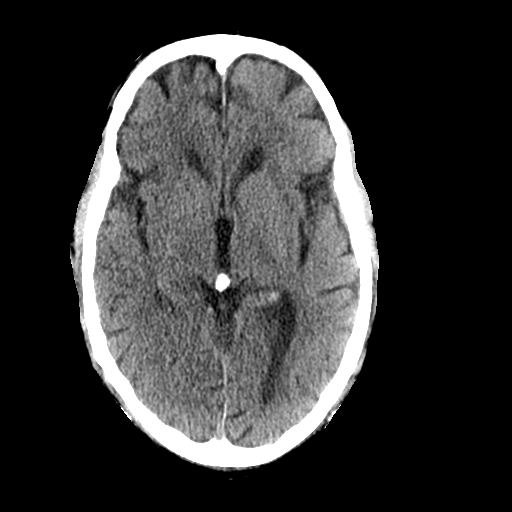
[im 18/33  brain]
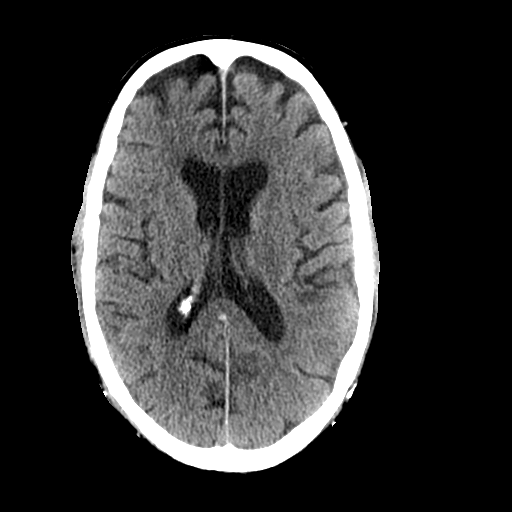
[im 18/33  bone]
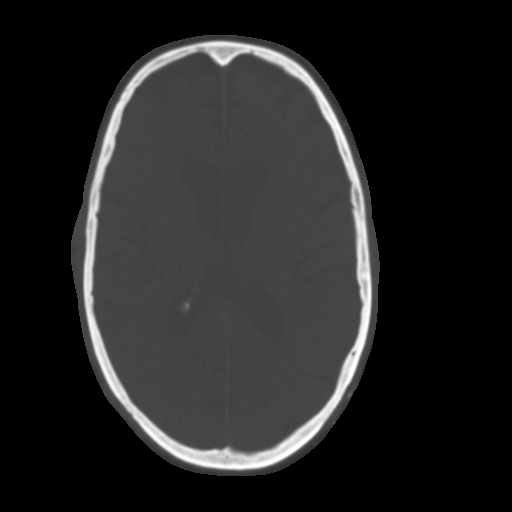
[im 23/33  brain]
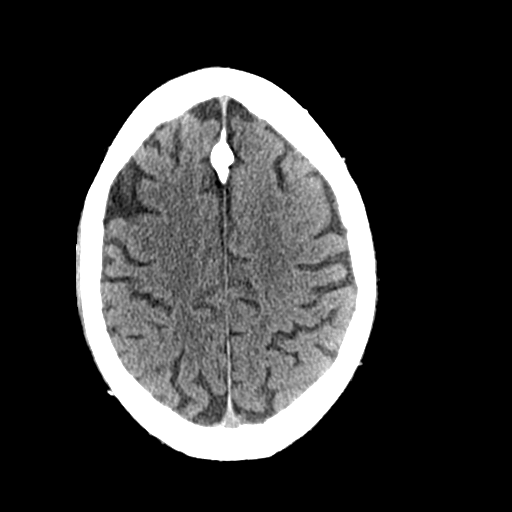
[im 26/33  brain]
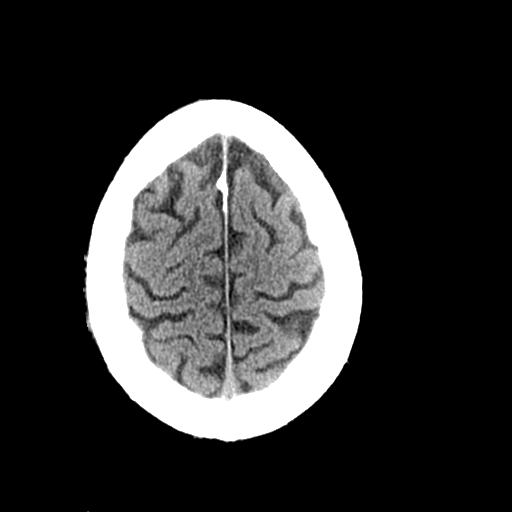
[im 30/33  brain]
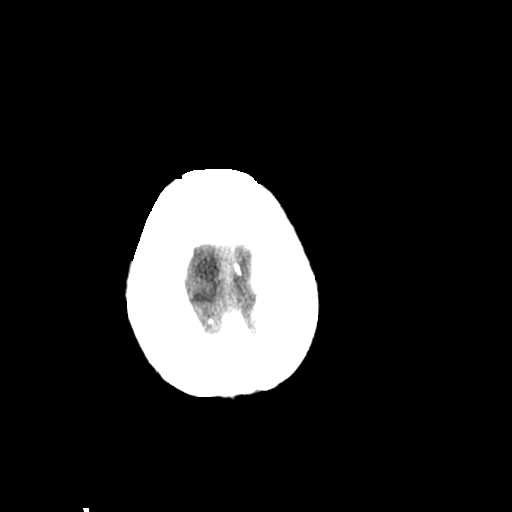

[Series 5: coronal soft tissue · coronal · 0.37mm/px · 3 of 77 slices shown]
[im 26/77  brain]
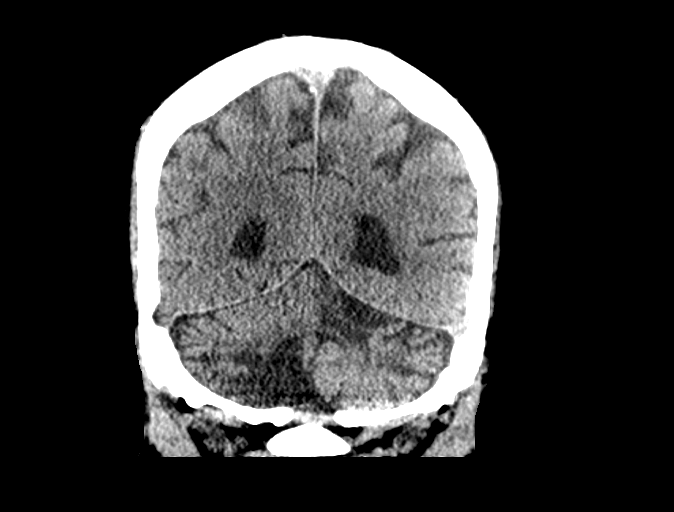
[im 34/77  brain]
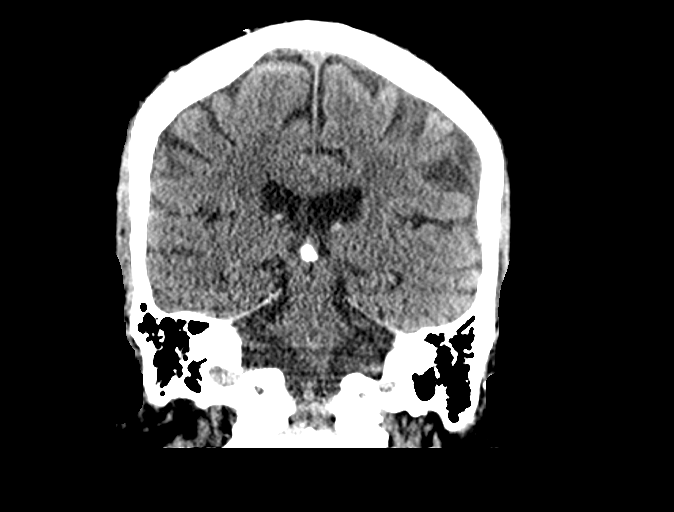
[im 43/77  brain]
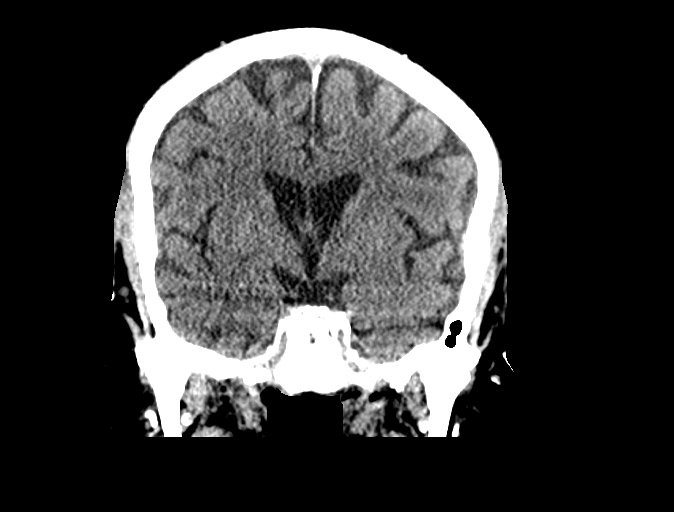

[Series 6: sagittal soft tissue · sagittal · 0.37mm/px · 3 of 58 slices shown]
[im 20/58  brain]
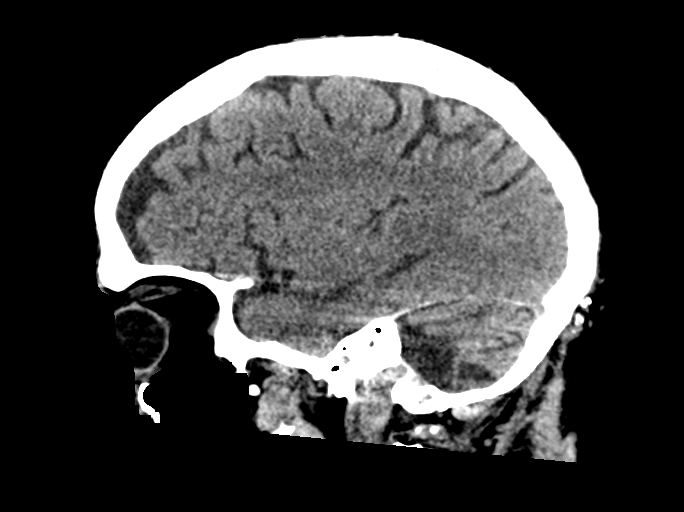
[im 29/58  brain]
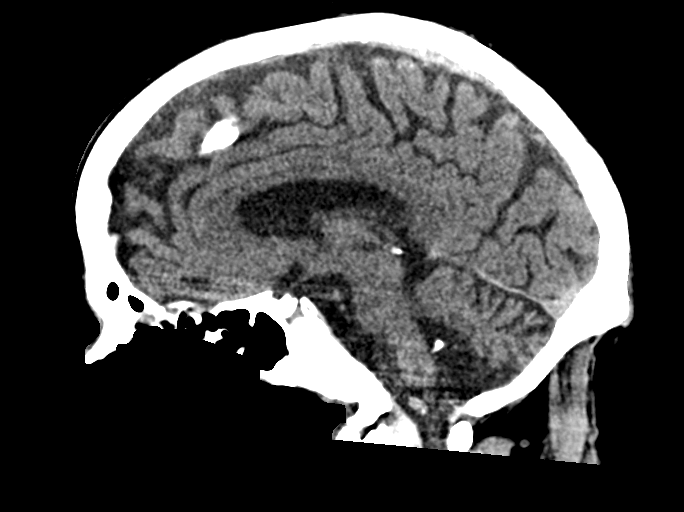
[im 39/58  brain]
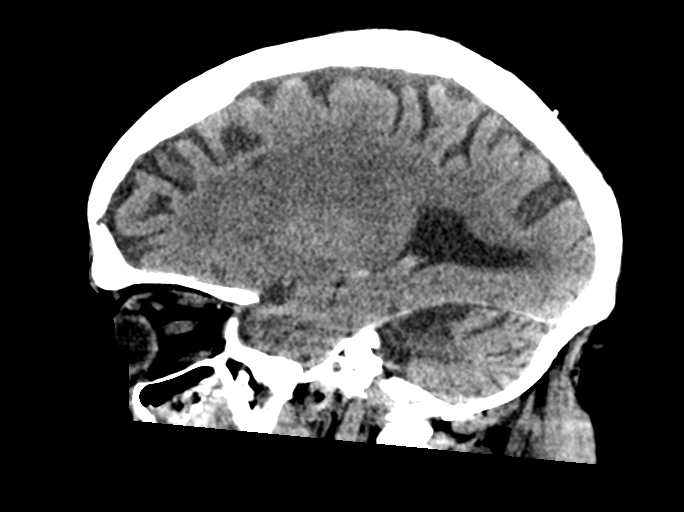

[14 of 47 positions shown; findings below may reference images not displayed]

FINDINGS: Brain: Large area of hypoattenuation in the right cerebellar
cortex. More subtle area of hypoattenuation in the superior left
cerebellum. No evidence of mass effect or midline shift. No evidence
of acute hemorrhage.

Vascular: Heavy calcific atherosclerotic disease of the intra
cavernous carotid arteries.

Skull: Normal. Negative for fracture or focal lesion.

Sinuses/Orbits: Dense consolidation in the left maxillary sinus with
high density material, query fungal sinusitis. The remainder of the
paranasal sinuses and mastoid air cells are normal.

Other: None.
IMPRESSION: 1. Large area of hypoattenuation in the right cerebellar cortex.
More subtle area of hypoattenuation in the superior left cerebellum.
These findings may represent areas of acute/subacute infarction or
other destructive brain process.
2. No evidence of acute hemorrhage.
3. Dense consolidation in the left maxillary sinus with high density
material, query fungal sinusitis.
4. Evaluation with brain MRI is recommended.

## 2020-07-18 IMAGING — DX DG CHEST 1V PORT
1 series · 1 of 1 positions shown · non-contrast
Comparison: [DATE].

CLINICAL DATA: Sepsis.

EXAM:
PORTABLE CHEST 1 VIEW

[chest ap]
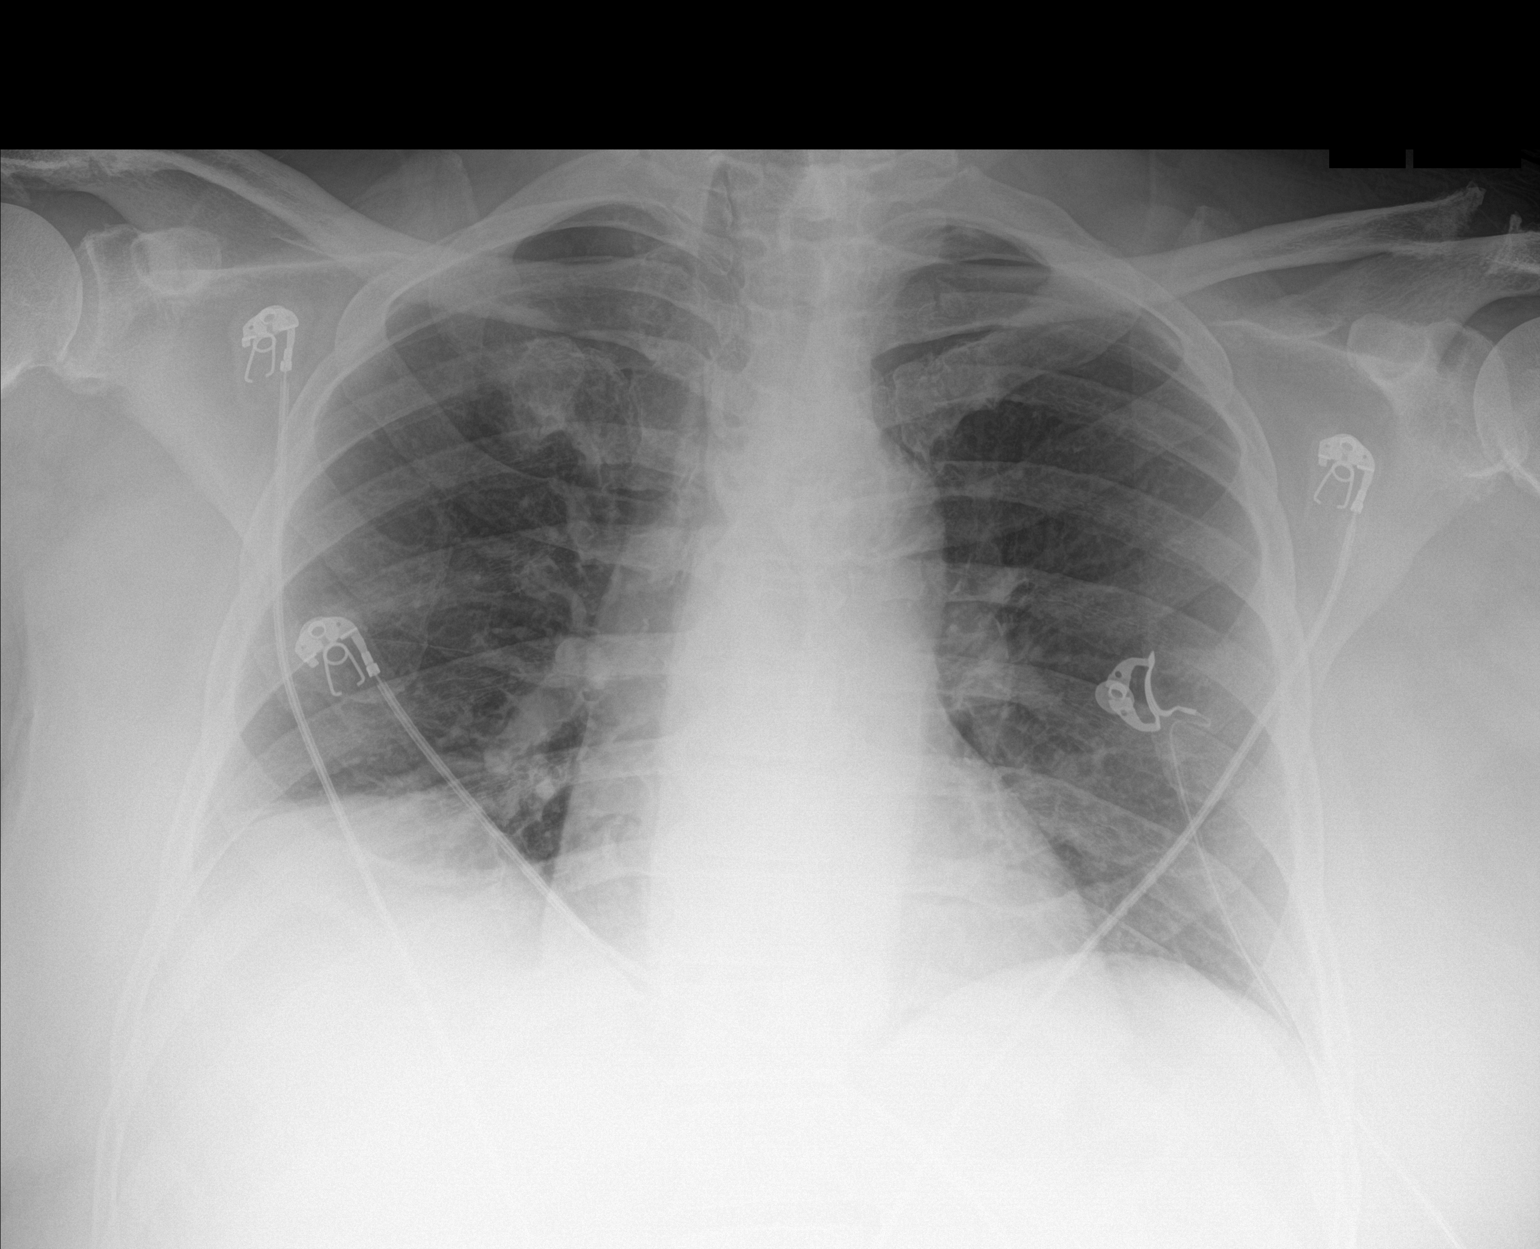

[1 of 1 positions shown; findings below may reference images not displayed]

FINDINGS: The heart size and mediastinal contours are within normal limits.
Both lungs are clear. The visualized skeletal structures are
unremarkable.
IMPRESSION: No active disease.

## 2020-07-18 MED ORDER — LINEZOLID 600 MG/300ML IV SOLN
600.0000 mg | Freq: Two times a day (BID) | INTRAVENOUS | Status: DC
  Administered 2020-07-19: 600 mg via INTRAVENOUS
  Filled 2020-07-18 (×2): qty 300

## 2020-07-18 MED ORDER — DORZOLAMIDE HCL 2 % OP SOLN
1.0000 [drp] | Freq: Two times a day (BID) | OPHTHALMIC | Status: DC
  Administered 2020-07-19 – 2020-07-22 (×6): 1 [drp] via OPHTHALMIC
  Filled 2020-07-18 (×2): qty 10

## 2020-07-18 MED ORDER — CLOPIDOGREL BISULFATE 75 MG PO TABS
75.0000 mg | ORAL_TABLET | Freq: Once | ORAL | Status: AC
  Administered 2020-07-18: 75 mg via ORAL
  Filled 2020-07-18: qty 1

## 2020-07-18 MED ORDER — LINEZOLID 600 MG/300ML IV SOLN
600.0000 mg | Freq: Once | INTRAVENOUS | Status: AC
  Administered 2020-07-18: 600 mg via INTRAVENOUS
  Filled 2020-07-18: qty 300

## 2020-07-18 MED ORDER — LACTATED RINGERS IV BOLUS (SEPSIS)
1000.0000 mL | Freq: Once | INTRAVENOUS | Status: AC
  Administered 2020-07-18: 1000 mL via INTRAVENOUS

## 2020-07-18 MED ORDER — LATANOPROST 0.005 % OP SOLN
1.0000 [drp] | Freq: Every day | OPHTHALMIC | Status: DC
  Administered 2020-07-19 – 2020-07-21 (×3): 1 [drp] via OPHTHALMIC
  Filled 2020-07-18 (×2): qty 2.5

## 2020-07-18 MED ORDER — DIPHENHYDRAMINE HCL 50 MG/ML IJ SOLN
12.5000 mg | Freq: Once | INTRAMUSCULAR | Status: AC
  Administered 2020-07-18: 12.5 mg via INTRAVENOUS
  Filled 2020-07-18: qty 1

## 2020-07-18 MED ORDER — ROSUVASTATIN CALCIUM 5 MG PO TABS
10.0000 mg | ORAL_TABLET | ORAL | Status: DC
  Administered 2020-07-20 – 2020-07-22 (×2): 10 mg via ORAL
  Filled 2020-07-18 (×2): qty 2

## 2020-07-18 MED ORDER — ASPIRIN EC 81 MG PO TBEC
81.0000 mg | DELAYED_RELEASE_TABLET | Freq: Every day | ORAL | Status: DC
  Administered 2020-07-19 – 2020-07-22 (×4): 81 mg via ORAL
  Filled 2020-07-18 (×4): qty 1

## 2020-07-18 MED ORDER — LACTATED RINGERS IV BOLUS (SEPSIS)
500.0000 mL | Freq: Once | INTRAVENOUS | Status: AC
  Administered 2020-07-18: 500 mL via INTRAVENOUS

## 2020-07-18 MED ORDER — ACETAMINOPHEN 325 MG PO TABS: 650.0000 mg | ORAL_TABLET | ORAL | Status: DC | PRN

## 2020-07-18 MED ORDER — ACETAMINOPHEN 160 MG/5ML PO SOLN: 650.0000 mg | ORAL | Status: DC | PRN

## 2020-07-18 MED ORDER — SODIUM CHLORIDE 0.9 % IV SOLN: 1.0000 g | INTRAVENOUS | Status: DC

## 2020-07-18 MED ORDER — GADOBUTROL 1 MMOL/ML IV SOLN
10.0000 mL | Freq: Once | INTRAVENOUS | Status: AC | PRN
  Administered 2020-07-18: 10 mL via INTRAVENOUS

## 2020-07-18 MED ORDER — METRONIDAZOLE IN NACL 5-0.79 MG/ML-% IV SOLN
500.0000 mg | Freq: Three times a day (TID) | INTRAVENOUS | Status: DC
  Administered 2020-07-19 (×4): 500 mg via INTRAVENOUS
  Filled 2020-07-18 (×4): qty 100

## 2020-07-18 MED ORDER — OMEGA-3 FATTY ACIDS 1000 MG PO CAPS: 2.0000 g | ORAL_CAPSULE | Freq: Every day | ORAL | Status: DC

## 2020-07-18 MED ORDER — ACETAMINOPHEN 650 MG RE SUPP: 650.0000 mg | RECTAL | Status: DC | PRN

## 2020-07-18 MED ORDER — STROKE: EARLY STAGES OF RECOVERY BOOK
Freq: Once | Status: DC
  Filled 2020-07-18 (×2): qty 1

## 2020-07-18 MED ORDER — PANTOPRAZOLE SODIUM 40 MG PO TBEC
40.0000 mg | DELAYED_RELEASE_TABLET | Freq: Every day | ORAL | Status: DC
  Administered 2020-07-19 – 2020-07-21 (×4): 40 mg via ORAL
  Filled 2020-07-18 (×5): qty 1

## 2020-07-18 MED ORDER — METRONIDAZOLE IN NACL 5-0.79 MG/ML-% IV SOLN
500.0000 mg | Freq: Once | INTRAVENOUS | Status: AC
Start: 2020-07-18 — End: 2020-07-18
  Administered 2020-07-18: 500 mg via INTRAVENOUS
  Filled 2020-07-18: qty 100

## 2020-07-18 MED ORDER — ADULT MULTIVITAMIN W/MINERALS CH
1.0000 | ORAL_TABLET | Freq: Every day | ORAL | Status: DC
  Administered 2020-07-19 – 2020-07-22 (×4): 1 via ORAL
  Filled 2020-07-18 (×4): qty 1

## 2020-07-18 MED ORDER — VANCOMYCIN HCL 2000 MG/400ML IV SOLN
2000.0000 mg | Freq: Once | INTRAVENOUS | Status: DC
  Administered 2020-07-18: 2000 mg via INTRAVENOUS
  Filled 2020-07-18: qty 400

## 2020-07-18 MED ORDER — OMEGA-3-ACID ETHYL ESTERS 1 G PO CAPS
2.0000 g | ORAL_CAPSULE | Freq: Every day | ORAL | Status: DC
  Administered 2020-07-19 – 2020-07-22 (×4): 2 g via ORAL
  Filled 2020-07-18 (×4): qty 2

## 2020-07-18 MED ORDER — BIOTIN 5000 MCG PO TABS: 5000.0000 ug | ORAL_TABLET | Freq: Every day | ORAL | Status: DC

## 2020-07-18 MED ORDER — INSULIN ASPART 100 UNIT/ML ~~LOC~~ SOLN
0.0000 [IU] | Freq: Three times a day (TID) | SUBCUTANEOUS | Status: DC
  Administered 2020-07-19 – 2020-07-22 (×4): 1 [IU] via SUBCUTANEOUS
  Filled 2020-07-18: qty 0.06

## 2020-07-18 MED ORDER — FAMOTIDINE IN NACL 20-0.9 MG/50ML-% IV SOLN
20.0000 mg | Freq: Once | INTRAVENOUS | Status: AC
  Administered 2020-07-18: 20 mg via INTRAVENOUS
  Filled 2020-07-18: qty 50

## 2020-07-18 MED ORDER — SODIUM CHLORIDE 0.9 % IV SOLN
2.0000 g | INTRAVENOUS | Status: DC
  Administered 2020-07-19: 2 g via INTRAVENOUS
  Filled 2020-07-18: qty 2

## 2020-07-18 MED ORDER — SODIUM CHLORIDE 0.9 % IV SOLN: INTRAVENOUS | Status: DC

## 2020-07-18 MED ORDER — DILTIAZEM HCL ER COATED BEADS 180 MG PO CP24
300.0000 mg | ORAL_CAPSULE | Freq: Every day | ORAL | Status: DC
  Administered 2020-07-19 – 2020-07-22 (×4): 300 mg via ORAL
  Filled 2020-07-18 (×4): qty 1

## 2020-07-18 MED ORDER — SODIUM CHLORIDE 0.9 % IV SOLN
2.0000 g | Freq: Once | INTRAVENOUS | Status: AC
  Administered 2020-07-18: 2 g via INTRAVENOUS
  Filled 2020-07-18: qty 2

## 2020-07-18 MED ORDER — VANCOMYCIN HCL IN DEXTROSE 1-5 GM/200ML-% IV SOLN
1000.0000 mg | Freq: Once | INTRAVENOUS | Status: DC
Start: 2020-07-18 — End: 2020-07-18
  Filled 2020-07-18: qty 200

## 2020-07-18 MED ORDER — ASPIRIN 81 MG PO CHEW
81.0000 mg | CHEWABLE_TABLET | Freq: Once | ORAL | Status: AC
  Administered 2020-07-18: 81 mg via ORAL
  Filled 2020-07-18: qty 1

## 2020-07-18 MED ORDER — ACETAMINOPHEN 325 MG PO TABS
650.0000 mg | ORAL_TABLET | Freq: Once | ORAL | Status: AC
  Administered 2020-07-18: 650 mg via ORAL
  Filled 2020-07-18: qty 2

## 2020-07-18 NOTE — Progress Notes (Addendum)
Pharmacy Antibiotic Note  Micheal Walls is a 85 y.o. male admitted on 07/18/2020 with sepsis.  Pharmacy has been consulted for Cefepime dosing. AKI noted- NCrCl ~ 64ml/min  Plan: Cefepime 2gm IV q24h Monitor renal function and cx data    Height: $Remove'6\' 1"'[id number]$  (185.4 cm) Weight: 124.7 kg (275 lb) IBW/kg (Calculated) : 79.9  Temp (24hrs), Avg:100.1 F (37.8 C), Min:99.2 F (37.3 C), Max:100.9 F (38.3 C)  Recent Labs  Lab 07/18/20 1413 07/18/20 1525  WBC 11.8*  --   CREATININE 2.65*  --   LATICACIDVEN 2.4* 1.2    Estimated Creatinine Clearance: 29.7 mL/min (A) (by C-G formula based on SCr of 2.65 mg/dL (H)).    Allergies  Allergen Reactions  . Vancomycin Rash    Antimicrobials this admission: 2/5 Cefepime >>  2/5 Zyvox >>  2/5 Flagyl >> 2/5 Vancomycin x1  Dose adjustments this admission:  Microbiology results: 2/5 BCx:  2/5 UCx:   2/5 COVID: negative   Thank you for allowing pharmacy to be a part of this patient's care.  Netta Cedars PharmD 07/18/2020 11:43 PM

## 2020-07-18 NOTE — ED Triage Notes (Signed)
Pt BIB EMS from home. Pt reports pt was acting abnormal around 1030 when he woke up this morning. Pt was not answering questions or moving around as much as normal. Family reports mild slurred speech. Pt reports feeling some weakness that began today. Pt denies chest pain, SHOB, and N/V/D. No recent falls or injuries.  . CBG 374 BP 126/72 95% RA Temp 98.7 RR 24 Hr 105

## 2020-07-18 NOTE — Progress Notes (Signed)
A consult was received from an ED provider for cefepime and vancomycin per pharmacy dosing.  The patient's profile has been reviewed for ht/wt/allergies/indication/available labs.   Cefepime 2 g IV x 1 already entered by provider. A one time order has been placed for vancomycin 2 g.  Further antibiotics/pharmacy consults should be ordered by admitting physician if indicated.                       Thank you, Efraim Kaufmann, PharmD, BCPS 07/18/2020  1:50 PM

## 2020-07-18 NOTE — H&P (Signed)
[facility] History and Physical  Micheal Walls MCS:[id number] DOB: August 13, 1935 DOA: 07/18/2020  Referring physician: Army Melia, PA-C PCP: [name], [name]   Chief Complaint: weakness  HPI: Micheal Walls is a 85 y.o. male with history of of diastolic heart failure, diabetes, hypertension, right BKA, anemia, who presents with weakness.  Patient reports that he woke up this morning suddenly feeling very weak like he could not get out of bed so his family called EMS. He lives at home with his wife and grandson. He denies any numbness, tingling, or weakness anywhere in his body. He endorses having had a little fever this morning, his only other symptom that he can recall is a cough. When asked how long he has had this cough he says that it has been since he quit smoking in 1996. He denies chest pain, shortness of breath, abdominal pain, burning or pain with urination, diarrhea, or any rash.  In the ED show vital signs notable for fever to 100.9 Fahrenheit and borderline tachycardia, as well as borderline tachypnea with respirations of 21 and persistent mild hypertension. Patient satting normally on room air. CBC with mildly elevated white count 11.8 but otherwise at baseline, CMP shows AKI with creatinine elevated at 2.6 from baseline of 1.7, CMP otherwise unremarkable. Lactic acid elevated on arrival at 2.4, down trended to normal with fluids. UA was unremarkable. Covid test was negative. Chest x-ray was unremarkable. Based on initial lab work-up sepsis protocol was initiated, given 2 L LR bolus, and started on vancomycin, Flagyl, cefepime. Due to allergic skin reaction to vancomycin this was changed to linezolid.  Per ED providers discussion with family patient had also recently been confused which prompted ordering of CT head. The read of the CT head was concerning for possibly right-sided acute versus subacute infarction, along with a question of fungal sinusitis in the left  maxillary sinus for which MRI was recommended. MRI was subsequently performed and showed no fungal sinusitis but did show possible subacute right frontal infarct and severe stenosis of the right vertebral artery. Discussed with neuro hospitalist with recommendation for Plavix and aspirin as well as hospitalist admit due to presence of AKI.  Patient was admitted for further work-up of possible stroke and management of AKI.  Review of Systems:  Pertinent positives and negative per HPI, all others reviewed and negative  Past Medical History:  Diagnosis Date  . Acute metabolic encephalopathy 01/16/2020  . Amputated below knee (HCC)    right  . Anemia   . Aspiration pneumonia (HCC) 11/02/2019  . Diastolic heart failure (HCC)   . Diverticulosis   . DM (diabetes mellitus) (HCC)   . Gout   . Hyperlipemia   . OSA on CPAP   . RBBB   . Sepsis (HCC) 11/02/2019  . Systemic hypertension    Past Surgical History:  Procedure Laterality Date  . LEG AMPUTATION BELOW KNEE     right  . NM MYOCAR PERF WALL MOTION  09/18/2009   no ischemia   Social History:  reports that he quit smoking about 19 years ago. His smoking use included cigars. He has never used smokeless tobacco. He reports that he does not drink alcohol and does not use drugs.  Allergies  Allergen Reactions  . Vancomycin Rash    Family History  Problem Relation Age of Onset  . Diabetes Mother   . Heart attack Father   . Cancer Sister      Prior to Admission medications  Medication Sig Start Date End Date Taking? Authorizing Provider  allopurinol (ZYLOPRIM) 100 MG tablet Take 1 tablet (100 mg total) by mouth daily. Patient taking differently: Take 100 mg by mouth 2 (two) times daily. 06/18/18  Yes [name], [name]  aspirin EC 81 MG tablet Take 81 mg by mouth daily. Swallow whole.   Yes [provider]  Biotin 5000 MCG TABS Take 5,000 mcg by mouth daily.   Yes [provider]  colchicine 0.6 MG tablet Take 0.6 mg  by mouth daily as needed (gout flare up).   Yes [provider]  diltiazem (CARDIZEM CD) 300 MG 24 hr capsule Take 1 capsule (300 mg total) by mouth daily. 03/25/20  Yes [name], [name]  dorzolamide (TRUSOPT) 2 % ophthalmic solution Place 1 drop into both eyes 2 (two) times daily. 06/20/18  Yes [provider]  ferrous sulfate 325 (65 FE) MG tablet Take 325 mg by mouth every Monday, Wednesday, and Friday.   Yes [provider]  fish oil-omega-3 fatty acids 1000 MG capsule Take 2 g by mouth daily.   Yes [provider]  furosemide (LASIX) 40 MG tablet Take 2 tabs in morning and 1 tab evening Patient taking differently: Take 80 mg by mouth daily. 01/23/20  Yes [name], [name]  gabapentin (NEURONTIN) 100 MG capsule Take 1 capsule by mouth 3 times a day Patient taking differently: Take 300 mg by mouth daily. 07/15/20  Yes [name], [name]  hydrocortisone (ANUSOL-HC) 25 MG suppository Place 25 mg rectally 2 (two) times daily as needed for hemorrhoids or anal itching.   Yes [provider]  latanoprost (XALATAN) 0.005 % ophthalmic solution Place 1 drop into both eyes at bedtime.  10/12/18  Yes [provider]  losartan (COZAAR) 100 MG tablet Take 1/2 tablet by mouth at bedtime (New directions) Patient taking differently: Take 50 mg by mouth at bedtime. 07/15/20  Yes [name], [name]  Multiple Vitamin (MULTIVITAMIN WITH MINERALS) TABS tablet Take 1 tablet by mouth daily.   Yes [provider]  pantoprazole (PROTONIX) 40 MG tablet Take 1 tablet  by mouth daily. Patient taking differently: Take 40 mg by mouth at bedtime. 07/15/20  Yes [name], [name]  Polyvinyl Alcohol-Povidone (REFRESH OP) Place 1 drop into both eyes daily as needed (dry eyes).   Yes [provider]  PRESCRIPTION MEDICATION Inhale into the lungs See admin instructions. CPAP- At bedtime   Yes [provider]  rosuvastatin (CRESTOR) 10 MG tablet Take 1 tablet  (10 mg total) by mouth daily. Patient taking differently: Take 10 mg by mouth every Monday, Wednesday, and Friday. 11/07/19  Yes Bonnielee Haff, MD  Semaglutide, [name], (OZEMPIC, [name],) 2 MG/1.5ML SOPN Inject 1 mg into the skin once a week. Patient taking differently: Inject 1 mg into the skin every Wednesday. 09/24/19  Yes [name], [name]  Continuous Blood Gluc Sensor (FREESTYLE LIBRE 14 DAY SENSOR) MISC Use as directed to check blood sugars 05/13/20   [name], [name]  diclofenac Sodium (VOLTAREN) 1 % GEL Apply 2 g topically 4 (four) times daily. Patient not taking: No sig reported 01/23/20   [name], [name]  glucose blood (FREESTYLE PRECISION NEO TEST) test strip Use as instructed 07/30/19   [name], [name]  niacin (NIASPAN) 500 MG CR tablet TAKE 2 TABLETS AT BEDTIME Patient not taking: No sig reported 05/13/20   [name], [name]   Physical Exam: Vitals:   07/18/20 1839 07/18/20 1900 07/18/20 1930  07/18/20 2000  BP: (!) 143/68 (!) 161/72 (!) 167/95 (!) 180/73  Pulse: 92 96 96 99  Resp: (!) 23 16 (!) 21 (!) 21  Temp:      TempSrc:      SpO2: 98% 100% 96% 97%  Weight:      Height:        Wt Readings from Last 3 Encounters:  07/18/20 124.7 kg  02/07/20 124.7 kg  02/05/20 125.1 kg     . General:  Appears calm and comfortable . Eyes: b/l arcus senilis, EOM intact . ENT: somewhat hard of hearing, normal lips & tongue . Neck: no masses  . Cardiovascular: RRR, no m/r/g. R BKA, LLE with 1-2+ pitting edema . Telemetry: SR, no arrhythmias  . Respiratory: CTA bilaterally, no w/r/r. Normal respiratory effort. . Abdomen: soft, ntnd . Skin: LLE with venous stasis changes. L great toe with ulceration present . Musculoskeletal: grossly normal tone BUE/BLE . Psychiatric: grossly normal mood and affect, speech fluent and appropriate . Neurologic: Mildly dysarthric but otherwise non focal with strength 5/5 in b/l UE/LE          Labs on Admission:  Basic Metabolic  Panel: Recent Labs  Lab 07/18/20 1413  NA 139  K 4.0  CL 103  CO2 24  GLUCOSE 187*  BUN 66*  CREATININE 2.65*  CALCIUM 10.2   Liver Function Tests: Recent Labs  Lab 07/18/20 1413  AST 27  ALT 20  ALKPHOS 52  BILITOT 1.1  PROT 7.0  ALBUMIN 3.7   No results for input(s): LIPASE, AMYLASE in the last 168 hours. No results for input(s): AMMONIA in the last 168 hours. CBC: Recent Labs  Lab 07/18/20 1413  WBC 11.8*  NEUTROABS 10.3*  HGB 11.4*  HCT 34.5*  MCV 95.0  PLT 123*   Cardiac Enzymes: No results for input(s): CKTOTAL, CKMB, CKMBINDEX, TROPONINI in the last 168 hours.  BNP (last 3 results) No results for input(s): BNP in the last 8760 hours.  ProBNP (last 3 results) No results for input(s): PROBNP in the last 8760 hours.  CBG: No results for input(s): GLUCAP in the last 168 hours.  Radiological Exams on Admission: CT Head Wo Contrast  Result Date: 07/18/2020 CLINICAL DATA:  Mental status change. EXAM: CT HEAD WITHOUT CONTRAST TECHNIQUE: Contiguous axial images were obtained from the base of the skull through the vertex without intravenous contrast. COMPARISON:  None. FINDINGS: Brain: Large area of hypoattenuation in the right cerebellar cortex. More subtle area of hypoattenuation in the superior left cerebellum. No evidence of mass effect or midline shift. No evidence of acute hemorrhage. Vascular: Heavy calcific atherosclerotic disease of the intra cavernous carotid arteries. Skull: Normal. Negative for fracture or focal lesion. Sinuses/Orbits: Dense consolidation in the left maxillary sinus with high density material, query fungal sinusitis. The remainder of the paranasal sinuses and mastoid air cells are normal. Other: None. IMPRESSION: 1. Large area of hypoattenuation in the right cerebellar cortex. More subtle area of hypoattenuation in the superior left cerebellum. These findings may represent areas of acute/subacute infarction or other destructive brain  process. 2. No evidence of acute hemorrhage. 3. Dense consolidation in the left maxillary sinus with high density material, query fungal sinusitis. 4. Evaluation with brain MRI is recommended. Electronically Signed   By: Fidela Salisbury M.D.   On: 07/18/2020 14:52   MR ANGIO HEAD WO CONTRAST  Addendum Date: 07/18/2020   ADDENDUM REPORT: 07/18/2020 19:37 ADDENDUM: On further review, while limited by motion there appears  to be absent flow related signal within the right intradural vertebral artery except for distally, concerning for age-indeterminant occlusion or high-grade stenosis of the proximal right vertebral artery. CTA could further characterize if clinically indicated. Findings in the report and addendum were discussed with Percell Miller, [place] via telephone at 7:32 PM via telephone. Electronically Signed   By: Margaretha Sheffield MD   On: 07/18/2020 19:37   Result Date: 07/18/2020 CLINICAL DATA:  Neuro deficit, acute stroke suspected. EXAM: MRI HEAD WITHOUT CONTRAST MRA HEAD WITHOUT CONTRAST TECHNIQUE: Multiplanar, multiecho pulse sequences of the brain and surrounding structures were obtained without intravenous contrast. Angiographic images of the Circle of Willis were obtained using MRA technique without intravenous contrast. CONTRAST:  46mL GADAVIST GADOBUTROL 1 MMOL/ML IV SOLN COMPARISON:  Same day head CT. FINDINGS: MRI HEAD FINDINGS Brain: Remote large bilateral cerebellar infarcts with encephalomalacia. Remote right paramidline pontine infarct. Small area of DWI hyperintensity without definite ADC correlate in the right frontal lobe. Mild associated edema without mass effect. Additional T2/FLAIR hyperintensities within the white matter, mild for age in likely related to chronic microvascular ischemic disease. No acute hemorrhage. No hydrocephalus. No mass lesion or abnormal mass effect. No extra-axial fluid collection. Moderate cerebral atrophy. Vascular: Poor flow void within the imaged intradural right  vertebral artery Skull and upper cervical spine: Diffusely T1 hypointense marrow signal in the upper cervical spine. Sinuses/Orbits: Left maxillary sinus mucosal thickening and mild mucosal thickening of scattered ethmoid air cells. No air-fluid levels. Unremarkable orbits. Other: No mastoid effusions. MRA HEAD FINDINGS Significantly limited MRA due to patient motion. Within this limitation: Anterior circulation: Bilateral internal carotid arteries are patent through the carotid siphons. Bilateral M1 and proximal M2 MCA branches are patent without evidence of hemodynamically significant proximal stenosis. Bilateral ACAs are patent. No visible aneurysm. Posterior circulation: The intradural vertebral arteries are poorly visualized due to motion; however, there is suspected severe stenosis or occlusion of the right distal intradural vertebral artery and possible left intradural vertebral artery stenosis. Diminutive vertebrobasilar system with bilateral fetal type PCAs. There is suspected mild stenosis of the basilar artery. Bilateral PCAs are patent without evidence of hemodynamically significant proximal stenosis. No visible aneurysm. IMPRESSION: MRI head: 1. Small area of DWI hyperintensity without definite ADC correlate in the right frontal lobe, either T2 shine through/artifact or subacute infarct. 2. Remote large bilateral cerebellar infarcts and a remote pontine infarct. 3. Diffusely T1 hypointense marrow signal in the upper cervical spine, which is nonspecific and could relate to chronic anemia, chronic hypoxia (such as in smokers), or a lymphoproliferative disorder. MRA: 1. Significantly limited MRA secondary to patient motion. 2. The intradural vertebral arteries are poorly visualized; however, there is suspected severe stenosis or occlusion of the right distal intradural vertebral artery and possible left vertebral artery stenosis. A neck CTA could further characterize if clinically indicated. 3. Diminutive  vertebrobasilar system with bilateral fetal type PCAs and likely mild narrowing of the basilar artery. 4. No large vessel occlusion or evidence of hemodynamically significant proximal stenosis in the anterior circulation Electronically Signed: By: Margaretha Sheffield MD On: 07/18/2020 19:25   MR Brain W and Lottie Dawson Contrast  Addendum Date: 07/18/2020   ADDENDUM REPORT: 07/18/2020 19:37 ADDENDUM: On further review, while limited by motion there appears to be absent flow related signal within the right intradural vertebral artery except for distally, concerning for age-indeterminant occlusion or high-grade stenosis of the proximal right vertebral artery. CTA could further characterize if clinically indicated. Findings in the report and addendum were  discussed with Percell Miller, [place] via telephone at 7:32 PM via telephone. Electronically Signed   By: Margaretha Sheffield MD   On: 07/18/2020 19:37   Result Date: 07/18/2020 CLINICAL DATA:  Neuro deficit, acute stroke suspected. EXAM: MRI HEAD WITHOUT CONTRAST MRA HEAD WITHOUT CONTRAST TECHNIQUE: Multiplanar, multiecho pulse sequences of the brain and surrounding structures were obtained without intravenous contrast. Angiographic images of the Circle of Willis were obtained using MRA technique without intravenous contrast. CONTRAST:  25mL GADAVIST GADOBUTROL 1 MMOL/ML IV SOLN COMPARISON:  Same day head CT. FINDINGS: MRI HEAD FINDINGS Brain: Remote large bilateral cerebellar infarcts with encephalomalacia. Remote right paramidline pontine infarct. Small area of DWI hyperintensity without definite ADC correlate in the right frontal lobe. Mild associated edema without mass effect. Additional T2/FLAIR hyperintensities within the white matter, mild for age in likely related to chronic microvascular ischemic disease. No acute hemorrhage. No hydrocephalus. No mass lesion or abnormal mass effect. No extra-axial fluid collection. Moderate cerebral atrophy. Vascular: Poor flow void within the  imaged intradural right vertebral artery Skull and upper cervical spine: Diffusely T1 hypointense marrow signal in the upper cervical spine. Sinuses/Orbits: Left maxillary sinus mucosal thickening and mild mucosal thickening of scattered ethmoid air cells. No air-fluid levels. Unremarkable orbits. Other: No mastoid effusions. MRA HEAD FINDINGS Significantly limited MRA due to patient motion. Within this limitation: Anterior circulation: Bilateral internal carotid arteries are patent through the carotid siphons. Bilateral M1 and proximal M2 MCA branches are patent without evidence of hemodynamically significant proximal stenosis. Bilateral ACAs are patent. No visible aneurysm. Posterior circulation: The intradural vertebral arteries are poorly visualized due to motion; however, there is suspected severe stenosis or occlusion of the right distal intradural vertebral artery and possible left intradural vertebral artery stenosis. Diminutive vertebrobasilar system with bilateral fetal type PCAs. There is suspected mild stenosis of the basilar artery. Bilateral PCAs are patent without evidence of hemodynamically significant proximal stenosis. No visible aneurysm. IMPRESSION: MRI head: 1. Small area of DWI hyperintensity without definite ADC correlate in the right frontal lobe, either T2 shine through/artifact or subacute infarct. 2. Remote large bilateral cerebellar infarcts and a remote pontine infarct. 3. Diffusely T1 hypointense marrow signal in the upper cervical spine, which is nonspecific and could relate to chronic anemia, chronic hypoxia (such as in smokers), or a lymphoproliferative disorder. MRA: 1. Significantly limited MRA secondary to patient motion. 2. The intradural vertebral arteries are poorly visualized; however, there is suspected severe stenosis or occlusion of the right distal intradural vertebral artery and possible left vertebral artery stenosis. A neck CTA could further characterize if clinically  indicated. 3. Diminutive vertebrobasilar system with bilateral fetal type PCAs and likely mild narrowing of the basilar artery. 4. No large vessel occlusion or evidence of hemodynamically significant proximal stenosis in the anterior circulation Electronically Signed: By: Margaretha Sheffield MD On: 07/18/2020 19:25   DG Chest Port 1 View  Result Date: 07/18/2020 CLINICAL DATA:  Sepsis. EXAM: PORTABLE CHEST 1 VIEW COMPARISON:  January 16, 2020. FINDINGS: The heart size and mediastinal contours are within normal limits. Both lungs are clear. The visualized skeletal structures are unremarkable. IMPRESSION: No active disease. Electronically Signed   By: Marijo Conception M.D.   On: 07/18/2020 14:20    EKG: Independently reviewed. Sinus tach, questionable first-degree block though difficult to distinguish P waves, right bundle branch block. No significant change compared to prior from August 2021.  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 No motor deficits appreciated on basic neuro exam. Per ED discussion w Neuro Hospitalist transfer to [facility] for further workup, will proceed with empiric stroke workup pending their formal recommendations.  - PT consult - OT consult -Swallow eval - TTE -Carotid ultrasound - tele -Aspirin and Plavix -Consult neuro in a.m. -Check A1c and lipid panel  #Sepsis Patient presenting with mild fever, tachycardia, tachypnea, and elevated white count.  Source not totally clear, UA is unremarkable, MRI with no suggestion of CNS infection, chest x-ray is unremarkable as well.  Does have left great toe diabetic ulcer, as well as  erythematous left lower extremity though suspect this is all due to chronic venous stasis and not cellulitis.  We will maintain him on broad-spectrum antibiotics pending reevaluation in a.m. -Cefepime per pharmacy protocol -Linezolid per pharmacy protocol -Flagyl per pharmacy protocol -Follow-up cultures  #AKI Likely prerenal, status post IV fluids, recheck CMP in the a.m.  #Known medical problems Gout: Hold allopurinol in setting of AKI Hypertension: Continue diltiazem, hold losartan in setting of AKI GERD: Continue PPI Hyperlipidemia: Continue Crestor Glaucoma: Continue dorzolamide, latanoprost Lower extremity edema: Hold Lasix in setting of AKI Neuropathy: Hold gabapentin in setting of AKI DM2: Hold semaglutide. Accucheck QID with ISS  Code Status: Full Code DVT Prophylaxis: SCDs Family Communication: None Disposition Plan: Inpatient, Med-Surg at [facility]   Time spent: 52 min  Clarnce Flock MD/MPH [facility]

## 2020-07-18 NOTE — ED Provider Notes (Signed)
Assumed care at change of shift, generalized weakness and fatigue. Sepsis order set initiated.  Abnormal head CT finding, concern for infection. Recommended MRI (pending). May lead to neurosurgery consult.  Bilateral dysmetria, bilateral nystagmus, no weakness.  Physical Exam  BP (!) 165/79   Pulse 92   Temp 99.2 F (37.3 C) (Oral)   Resp 18   Ht 6\' 1"  (1.854 m)   Wt 124.7 kg   SpO2 91%   BMI 36.28 kg/m   Physical Exam  ED Course/Procedures     Procedures  MDM  MRI returns with vertebral artery stenosis, subacute infarct possible. Case discussed with neuro hospitalist, Dr. Lorrin Goodell, recommends Plavix (75mg ) and ASA (81mg ), requests hospitalist to admit, transfer to [facility] for admission for neuro work-up. Discussed with hospitalist service who will arrange for transfer to [facility] for admission.  Discussed findings and plan of care with patient and son (son will be headed home, is available by phone).     Tacy Learn, PA-C 07/18/20 2031    Drenda Freeze, MD 07/18/20 2223

## 2020-07-18 NOTE — ED Provider Notes (Addendum)
[facility] DEPT Provider Note   CSN: [id number] Arrival date & time: 07/18/20  1217     History Chief Complaint  Patient presents with  . Weakness    Micheal Walls is a 85 y.o. male.  HPI   85 year old male with history of acute metabolic encephalopathy, right BKA, anemia, aspiration pneumonia, heart failure, diverticulosis, diabetes, gout, hyperlipidemia, sleep apnea, right bundle branch block, sepsis, hypertension, who presents today the emergency department today for evaluation of generalized weakness.  States he woke up this morning and felt very fatigued and generally weak.  He feels like he has had chills/sweats as well.  He denies any sore throat, chest pain, shortness of breath, abdominal pain, vomiting, diarrhea or urinary complaints but he does report that he has had a cough for the last year that seems to be worse over the last 2-week it has now become productive.  Per ems pt was not acting himself upon waking this AM. multiple attempts at contacting family were made and there was no answer when calling wife or daughter for further information.  Past Medical History:  Diagnosis Date  . Acute metabolic encephalopathy 0/07/3341  . Amputated below knee ([place])    right  . Anemia   . Aspiration pneumonia ([place]) 11/02/2019  . Diastolic heart failure ([place])   . Diverticulosis   . DM (diabetes mellitus) ([place])   . Gout   . Hyperlipemia   . OSA on CPAP   . RBBB   . Sepsis ([place]) 11/02/2019  . Systemic hypertension     Patient Active Problem List   Diagnosis Date Noted  . Cerebral ischemia 07/18/2020  . Bilateral hearing loss 05/20/2020  . Does use hearing aid 05/20/2020  . Diabetic ulcer of left great toe ([place]) 01/16/2020  . Controlled type 2 diabetes mellitus with stable proliferative retinopathy of both eyes, with long-term current use of insulin ([place]) 01/06/2020  . Intermediate stage nonexudative age-related macular degeneration of both  eyes 01/06/2020  . Left epiretinal membrane 01/06/2020  . [place] of right macula 01/06/2020  . Cellulitis 11/02/2019  . Elevated TSH 07/10/2018  . Nephropathy 02/28/2018  . Disturbance of skin sensation 02/28/2018  . CKD (chronic kidney disease) stage 4, GFR 15-29 ml/min (HCC) 07/17/2017  . PAD (peripheral artery disease) ([place]) 07/17/2017  . 110.1 10/21/2013  . Epiphora 04/24/2013  . Laxity of eyelid 04/24/2013  . Anemia 04/09/2013  . History of peripheral vascular disease 04/09/2013  . History of stroke 04/09/2013  . S/P unilateral BKA (below knee amputation) ([place]) 03/17/2013  . Diabetes mellitus type 2 in obese ([place]) 03/17/2013  . Mixed hyperlipidemia 03/17/2013  . Essential hypertension 03/17/2013  . Gout 03/17/2013  . OSA on CPAP 03/17/2013  . Morbid obesity ([place]) 03/17/2013  . Anemia, iron deficiency 03/17/2013  . Diverticulosis of colon 03/17/2013  . RBBB 03/17/2013  . Punctal stenosis, acquired 10/02/2012    Past Surgical History:  Procedure Laterality Date  . LEG AMPUTATION BELOW KNEE     right  . NM MYOCAR PERF WALL MOTION  09/18/2009   no ischemia       Family History  Problem Relation Age of Onset  . Diabetes Mother   . Heart attack Father   . Cancer Sister     Social History   Tobacco Use  . Smoking status: Former Smoker    Types: Cigars    Quit date: 06/12/2001    Years since quitting: 19.1  . Smokeless tobacco: Never Used  Vaping Use  . Vaping Use: Former  Substance Use Topics  . Alcohol use: No    Alcohol/week: 0.0 standard drinks  . Drug use: No    Home Medications Prior to Admission medications   Medication Sig Start Date End Date Taking? Authorizing Provider  allopurinol (ZYLOPRIM) 100 MG tablet Take 1 tablet (100 mg total) by mouth daily. Patient taking differently: Take 100 mg by mouth 2 (two) times daily. 06/18/18  Yes [name], [name]  aspirin EC 81 MG tablet Take 81 mg by mouth daily. Swallow whole.   Yes [provider]   Biotin 5000 MCG TABS Take 5,000 mcg by mouth daily.   Yes [provider]  colchicine 0.6 MG tablet Take 0.6 mg by mouth daily as needed (gout flare up).   Yes [provider]  diltiazem (CARDIZEM CD) 300 MG 24 hr capsule Take 1 capsule (300 mg total) by mouth daily. 03/25/20  Yes [name], [name]  dorzolamide (TRUSOPT) 2 % ophthalmic solution Place 1 drop into both eyes 2 (two) times daily. 06/20/18  Yes [provider]  ferrous sulfate 325 (65 FE) MG tablet Take 325 mg by mouth every Monday, Wednesday, and Friday.   Yes [provider]  fish oil-omega-3 fatty acids 1000 MG capsule Take 2 g by mouth daily.   Yes [provider]  furosemide (LASIX) 40 MG tablet Take 2 tabs in morning and 1 tab evening Patient taking differently: Take 80 mg by mouth daily. 01/23/20  Yes [name], [name]  gabapentin (NEURONTIN) 100 MG capsule Take 1 capsule by mouth 3 times a day Patient taking differently: Take 300 mg by mouth daily. 07/15/20  Yes [name], [name]  hydrocortisone (ANUSOL-HC) 25 MG suppository Place 25 mg rectally 2 (two) times daily as needed for hemorrhoids or anal itching.   Yes [provider]  latanoprost (XALATAN) 0.005 % ophthalmic solution Place 1 drop into both eyes at bedtime.  10/12/18  Yes [provider]  losartan (COZAAR) 100 MG tablet Take 1/2 tablet by mouth at bedtime (New directions) Patient taking differently: Take 50 mg by mouth at bedtime. 07/15/20  Yes [name], [name]  Multiple Vitamin (MULTIVITAMIN WITH MINERALS) TABS tablet Take 1 tablet by mouth daily.   Yes [provider]  pantoprazole (PROTONIX) 40 MG tablet Take 1 tablet  by mouth daily. Patient taking differently: Take 40 mg by mouth at bedtime. 07/15/20  Yes [name], [name]  Polyvinyl Alcohol-Povidone (REFRESH OP) Place 1 drop into both eyes daily as needed (dry eyes).   Yes [provider]  PRESCRIPTION MEDICATION Inhale into the lungs  See admin instructions. CPAP- At bedtime   Yes [provider]  rosuvastatin (CRESTOR) 10 MG tablet Take 1 tablet (10 mg total) by mouth daily. Patient taking differently: Take 10 mg by mouth every Monday, Wednesday, and Friday. 11/07/19  Yes Bonnielee Haff, MD  Semaglutide, [name], (OZEMPIC, [name],) 2 MG/1.5ML SOPN Inject 1 mg into the skin once a week. Patient taking differently: Inject 1 mg into the skin every Wednesday. 09/24/19  Yes [name], [name]  Continuous Blood Gluc Sensor (FREESTYLE LIBRE 14 DAY SENSOR) MISC Use as directed to check blood sugars 05/13/20   [name], [name]  diclofenac Sodium (VOLTAREN) 1 % GEL Apply 2 g topically 4 (four) times daily. Patient not taking: No sig reported 01/23/20   [name], [name]  glucose blood (FREESTYLE PRECISION NEO TEST) test strip Use as instructed 07/30/19   Laurance Flatten,  Doreene Burke, [name]  niacin (NIASPAN) 500 MG CR tablet TAKE 2 TABLETS AT BEDTIME Patient not taking: No sig reported 05/13/20   [name], [name]    Allergies    Vancomycin  Review of Systems   Review of Systems  Constitutional: Positive for chills, diaphoresis and fatigue. Negative for fever.  HENT: Negative for ear pain and sore throat.   Eyes: Negative for visual disturbance.  Respiratory: Negative for cough and shortness of breath.   Cardiovascular: Negative for chest pain.  Gastrointestinal: Negative for abdominal pain, constipation, diarrhea, nausea and vomiting.  Genitourinary: Negative for dysuria and hematuria.  Musculoskeletal: Negative for back pain.  Skin: Negative for rash.  Neurological: Positive for weakness (generalized). Negative for seizures, syncope and headaches.  All other systems reviewed and are negative.   Physical Exam Updated Vital Signs BP (!) 142/68   Pulse 88   Temp 99.2 F (37.3 C) (Oral)   Resp 17   Ht $R'6\' 1"'zJ$  (1.854 m)   Wt 124.7 kg   SpO2 96%   BMI 36.28 kg/m   Physical Exam Vitals and nursing note reviewed.   Constitutional:      Appearance: He is well-developed and well-nourished.  HENT:     Head: Normocephalic and atraumatic.     Mouth/Throat:     Mouth: Mucous membranes are dry.  Eyes:     Extraocular Movements: Extraocular movements intact.     Conjunctiva/sclera: Conjunctivae normal.     Pupils: Pupils are equal, round, and reactive to light.     Comments: bilat horizontal nystagmus  Cardiovascular:     Rate and Rhythm: Regular rhythm. Tachycardia present.     Heart sounds: No murmur heard.   Pulmonary:     Effort: Pulmonary effort is normal. No respiratory distress.     Breath sounds: Rales present.  Abdominal:     General: Bowel sounds are normal.     Palpations: Abdomen is soft.     Tenderness: There is no abdominal tenderness. There is no guarding or rebound.  Musculoskeletal:        General: No edema.     Cervical back: Neck supple.     Comments: Right BKA. LLE with 2+ edema (chronic per patient). Chronic wound to right great toe which appears to be well healing.  Skin:    General: Skin is warm and dry.  Neurological:     Mental Status: He is alert.     Comments: Mental Status:  Alert, thought content appropriate, able to give a coherent history. Speech fluent without evidence of aphasia. Able to follow 2 step commands without difficulty.  Cranial Nerves:  II:   pupils equal, round, reactive to light III,IV, VI: ptosis not present, extra-ocular motions intact bilaterally  V,VII: smile symmetric, facial light touch sensation equal VIII: hearing grossly normal to voice  X: uvula elevates symmetrically  XI: bilateral shoulder shrug symmetric and strong XII: midline tongue extension without fassiculations Motor:  Normal tone. 5/5 strength of BUE and BLE major muscle groups including strong and equal grip strength  Sensory: light touch normal in all extremities. Cerebellar: dysmetria bilat with finger to nose Gait: not assessed   Psychiatric:        Mood and Affect:  Mood and affect normal.     ED Results / Procedures / Treatments   Labs (all labs ordered are listed, but only abnormal results are displayed) Labs Reviewed  LACTIC ACID, PLASMA - Abnormal; Notable for the following components:  Result Value   Lactic Acid, Venous 2.4 (*)    All other components within normal limits  COMPREHENSIVE METABOLIC PANEL - Abnormal; Notable for the following components:   Glucose, Bld 187 (*)    BUN 66 (*)    Creatinine, Ser 2.65 (*)    GFR, Estimated 23 (*)    All other components within normal limits  CBC WITH DIFFERENTIAL/PLATELET - Abnormal; Notable for the following components:   WBC 11.8 (*)    RBC 3.63 (*)    Hemoglobin 11.4 (*)    HCT 34.5 (*)    RDW 17.0 (*)    Platelets 123 (*)    Neutro Abs 10.3 (*)    Lymphs Abs 0.5 (*)    All other components within normal limits  APTT - Abnormal; Notable for the following components:   aPTT <20 (*)    All other components within normal limits  HEMOGLOBIN A1C - Abnormal; Notable for the following components:   Hgb A1c MFr Bld 6.1 (*)    All other components within normal limits  LIPID PANEL - Abnormal; Notable for the following components:   HDL 38 (*)    All other components within normal limits  CBG MONITORING, ED - Abnormal; Notable for the following components:   Glucose-Capillary 159 (*)    All other components within normal limits  SARS CORONAVIRUS 2 BY RT PCR (HOSPITAL ORDER, [facility])  CULTURE, BLOOD (ROUTINE X 2)  CULTURE, BLOOD (ROUTINE X 2)  URINE CULTURE  LACTIC ACID, PLASMA  PROTIME-INR  URINALYSIS, ROUTINE W REFLEX MICROSCOPIC    EKG EKG Interpretation  Date/Time:  Saturday July 18 2020 12:30:51 EST Ventricular Rate:  104 PR Interval:    QRS Duration: 151 QT Interval:  329 QTC Calculation: 433 R Axis:   47 Text Interpretation: Sinus or ectopic atrial tachycardia Right bundle branch block No sig change from Aug 2021 No STEMI Confirmed by  Octaviano Glow [phone]) on 07/18/2020 1:58:54 PM   Radiology CT Head Wo Contrast  Result Date: 07/18/2020 CLINICAL DATA:  Mental status change. EXAM: CT HEAD WITHOUT CONTRAST TECHNIQUE: Contiguous axial images were obtained from the base of the skull through the vertex without intravenous contrast. COMPARISON:  None. FINDINGS: Brain: Large area of hypoattenuation in the right cerebellar cortex. More subtle area of hypoattenuation in the superior left cerebellum. No evidence of mass effect or midline shift. No evidence of acute hemorrhage. Vascular: Heavy calcific atherosclerotic disease of the intra cavernous carotid arteries. Skull: Normal. Negative for fracture or focal lesion. Sinuses/Orbits: Dense consolidation in the left maxillary sinus with high density material, query fungal sinusitis. The remainder of the paranasal sinuses and mastoid air cells are normal. Other: None. IMPRESSION: 1. Large area of hypoattenuation in the right cerebellar cortex. More subtle area of hypoattenuation in the superior left cerebellum. These findings may represent areas of acute/subacute infarction or other destructive brain process. 2. No evidence of acute hemorrhage. 3. Dense consolidation in the left maxillary sinus with high density material, query fungal sinusitis. 4. Evaluation with brain MRI is recommended. Electronically Signed   By: Fidela Salisbury M.D.   On: 07/18/2020 14:52   MR ANGIO HEAD WO CONTRAST  Addendum Date: 07/18/2020   ADDENDUM REPORT: 07/18/2020 19:37 ADDENDUM: On further review, while limited by motion there appears to be absent flow related signal within the right intradural vertebral artery except for distally, concerning for age-indeterminant occlusion or high-grade stenosis of the proximal right vertebral artery. CTA could  further characterize if clinically indicated. Findings in the report and addendum were discussed with Percell Miller, [place] via telephone at 7:32 PM via telephone. Electronically  Signed   By: Margaretha Sheffield MD   On: 07/18/2020 19:37   Result Date: 07/18/2020 CLINICAL DATA:  Neuro deficit, acute stroke suspected. EXAM: MRI HEAD WITHOUT CONTRAST MRA HEAD WITHOUT CONTRAST TECHNIQUE: Multiplanar, multiecho pulse sequences of the brain and surrounding structures were obtained without intravenous contrast. Angiographic images of the Circle of Willis were obtained using MRA technique without intravenous contrast. CONTRAST:  23mL GADAVIST GADOBUTROL 1 MMOL/ML IV SOLN COMPARISON:  Same day head CT. FINDINGS: MRI HEAD FINDINGS Brain: Remote large bilateral cerebellar infarcts with encephalomalacia. Remote right paramidline pontine infarct. Small area of DWI hyperintensity without definite ADC correlate in the right frontal lobe. Mild associated edema without mass effect. Additional T2/FLAIR hyperintensities within the white matter, mild for age in likely related to chronic microvascular ischemic disease. No acute hemorrhage. No hydrocephalus. No mass lesion or abnormal mass effect. No extra-axial fluid collection. Moderate cerebral atrophy. Vascular: Poor flow void within the imaged intradural right vertebral artery Skull and upper cervical spine: Diffusely T1 hypointense marrow signal in the upper cervical spine. Sinuses/Orbits: Left maxillary sinus mucosal thickening and mild mucosal thickening of scattered ethmoid air cells. No air-fluid levels. Unremarkable orbits. Other: No mastoid effusions. MRA HEAD FINDINGS Significantly limited MRA due to patient motion. Within this limitation: Anterior circulation: Bilateral internal carotid arteries are patent through the carotid siphons. Bilateral M1 and proximal M2 MCA branches are patent without evidence of hemodynamically significant proximal stenosis. Bilateral ACAs are patent. No visible aneurysm. Posterior circulation: The intradural vertebral arteries are poorly visualized due to motion; however, there is suspected severe stenosis or occlusion  of the right distal intradural vertebral artery and possible left intradural vertebral artery stenosis. Diminutive vertebrobasilar system with bilateral fetal type PCAs. There is suspected mild stenosis of the basilar artery. Bilateral PCAs are patent without evidence of hemodynamically significant proximal stenosis. No visible aneurysm. IMPRESSION: MRI head: 1. Small area of DWI hyperintensity without definite ADC correlate in the right frontal lobe, either T2 shine through/artifact or subacute infarct. 2. Remote large bilateral cerebellar infarcts and a remote pontine infarct. 3. Diffusely T1 hypointense marrow signal in the upper cervical spine, which is nonspecific and could relate to chronic anemia, chronic hypoxia (such as in smokers), or a lymphoproliferative disorder. MRA: 1. Significantly limited MRA secondary to patient motion. 2. The intradural vertebral arteries are poorly visualized; however, there is suspected severe stenosis or occlusion of the right distal intradural vertebral artery and possible left vertebral artery stenosis. A neck CTA could further characterize if clinically indicated. 3. Diminutive vertebrobasilar system with bilateral fetal type PCAs and likely mild narrowing of the basilar artery. 4. No large vessel occlusion or evidence of hemodynamically significant proximal stenosis in the anterior circulation Electronically Signed: By: Margaretha Sheffield MD On: 07/18/2020 19:25   MR Brain W and Lottie Dawson Contrast  Addendum Date: 07/18/2020   ADDENDUM REPORT: 07/18/2020 19:37 ADDENDUM: On further review, while limited by motion there appears to be absent flow related signal within the right intradural vertebral artery except for distally, concerning for age-indeterminant occlusion or high-grade stenosis of the proximal right vertebral artery. CTA could further characterize if clinically indicated. Findings in the report and addendum were discussed with Percell Miller, [place] via telephone at 7:32 PM via  telephone. Electronically Signed   By: Margaretha Sheffield MD   On: 07/18/2020 19:37   Result Date:  07/18/2020 CLINICAL DATA:  Neuro deficit, acute stroke suspected. EXAM: MRI HEAD WITHOUT CONTRAST MRA HEAD WITHOUT CONTRAST TECHNIQUE: Multiplanar, multiecho pulse sequences of the brain and surrounding structures were obtained without intravenous contrast. Angiographic images of the Circle of Willis were obtained using MRA technique without intravenous contrast. CONTRAST:  44mL GADAVIST GADOBUTROL 1 MMOL/ML IV SOLN COMPARISON:  Same day head CT. FINDINGS: MRI HEAD FINDINGS Brain: Remote large bilateral cerebellar infarcts with encephalomalacia. Remote right paramidline pontine infarct. Small area of DWI hyperintensity without definite ADC correlate in the right frontal lobe. Mild associated edema without mass effect. Additional T2/FLAIR hyperintensities within the white matter, mild for age in likely related to chronic microvascular ischemic disease. No acute hemorrhage. No hydrocephalus. No mass lesion or abnormal mass effect. No extra-axial fluid collection. Moderate cerebral atrophy. Vascular: Poor flow void within the imaged intradural right vertebral artery Skull and upper cervical spine: Diffusely T1 hypointense marrow signal in the upper cervical spine. Sinuses/Orbits: Left maxillary sinus mucosal thickening and mild mucosal thickening of scattered ethmoid air cells. No air-fluid levels. Unremarkable orbits. Other: No mastoid effusions. MRA HEAD FINDINGS Significantly limited MRA due to patient motion. Within this limitation: Anterior circulation: Bilateral internal carotid arteries are patent through the carotid siphons. Bilateral M1 and proximal M2 MCA branches are patent without evidence of hemodynamically significant proximal stenosis. Bilateral ACAs are patent. No visible aneurysm. Posterior circulation: The intradural vertebral arteries are poorly visualized due to motion; however, there is suspected  severe stenosis or occlusion of the right distal intradural vertebral artery and possible left intradural vertebral artery stenosis. Diminutive vertebrobasilar system with bilateral fetal type PCAs. There is suspected mild stenosis of the basilar artery. Bilateral PCAs are patent without evidence of hemodynamically significant proximal stenosis. No visible aneurysm. IMPRESSION: MRI head: 1. Small area of DWI hyperintensity without definite ADC correlate in the right frontal lobe, either T2 shine through/artifact or subacute infarct. 2. Remote large bilateral cerebellar infarcts and a remote pontine infarct. 3. Diffusely T1 hypointense marrow signal in the upper cervical spine, which is nonspecific and could relate to chronic anemia, chronic hypoxia (such as in smokers), or a lymphoproliferative disorder. MRA: 1. Significantly limited MRA secondary to patient motion. 2. The intradural vertebral arteries are poorly visualized; however, there is suspected severe stenosis or occlusion of the right distal intradural vertebral artery and possible left vertebral artery stenosis. A neck CTA could further characterize if clinically indicated. 3. Diminutive vertebrobasilar system with bilateral fetal type PCAs and likely mild narrowing of the basilar artery. 4. No large vessel occlusion or evidence of hemodynamically significant proximal stenosis in the anterior circulation Electronically Signed: By: Margaretha Sheffield MD On: 07/18/2020 19:25   DG Chest Port 1 View  Result Date: 07/18/2020 CLINICAL DATA:  Sepsis. EXAM: PORTABLE CHEST 1 VIEW COMPARISON:  January 16, 2020. FINDINGS: The heart size and mediastinal contours are within normal limits. Both lungs are clear. The visualized skeletal structures are unremarkable. IMPRESSION: No active disease. Electronically Signed   By: Marijo Conception M.D.   On: 07/18/2020 14:20    Procedures Procedures   CRITICAL CARE Performed by: Rodney Booze   Total critical care  time: 38 minutes  Critical care time was exclusive of separately billable procedures and treating other patients.  Critical care was necessary to treat or prevent imminent or life-threatening deterioration.  Critical care was time spent personally by me on the following activities: development of treatment plan with patient and/or surrogate as well as nursing, discussions with  consultants, evaluation of patient's response to treatment, examination of patient, obtaining history from patient or surrogate, ordering and performing treatments and interventions, ordering and review of laboratory studies, ordering and review of radiographic studies, pulse oximetry and re-evaluation of patient's condition.   Medications Ordered in ED Medications  aspirin EC tablet 81 mg (has no administration in time range)  diltiazem (CARDIZEM CD) 24 hr capsule 300 mg (has no administration in time range)  rosuvastatin (CRESTOR) tablet 10 mg (has no administration in time range)  pantoprazole (PROTONIX) EC tablet 40 mg (40 mg Oral Given 07/19/20 0004)  multivitamin with minerals tablet 1 tablet (has no administration in time range)  dorzolamide (TRUSOPT) 2 % ophthalmic solution 1 drop (has no administration in time range)  latanoprost (XALATAN) 0.005 % ophthalmic solution 1 drop (has no administration in time range)   stroke: mapping our early stages of recovery book ( Does not apply Not Given 07/18/20 2338)  0.9 %  sodium chloride infusion ( Intravenous New Bag/Given 07/19/20 0003)  acetaminophen (TYLENOL) tablet 650 mg (has no administration in time range)    Or  acetaminophen (TYLENOL) 160 MG/5ML solution 650 mg (has no administration in time range)    Or  acetaminophen (TYLENOL) suppository 650 mg (has no administration in time range)  metroNIDAZOLE (FLAGYL) IVPB 500 mg (500 mg Intravenous New Bag/Given 07/19/20 0003)  linezolid (ZYVOX) IVPB 600 mg (600 mg Intravenous New Bag/Given 07/19/20 0449)  insulin aspart  (novoLOG) injection 0-6 Units (has no administration in time range)  omega-3 acid ethyl esters (LOVAZA) capsule 2 g (has no administration in time range)  ceFEPIme (MAXIPIME) 2 g in sodium chloride 0.9 % 100 mL IVPB (has no administration in time range)  lactated ringers bolus 500 mL (0 mLs Intravenous Stopped 07/18/20 1648)  ceFEPIme (MAXIPIME) 2 g in sodium chloride 0.9 % 100 mL IVPB (0 g Intravenous Stopped 07/18/20 1445)  metroNIDAZOLE (FLAGYL) IVPB 500 mg (0 mg Intravenous Stopped 07/18/20 1522)  acetaminophen (TYLENOL) tablet 650 mg (650 mg Oral Given 07/18/20 1358)  lactated ringers bolus 1,000 mL (0 mLs Intravenous Stopped 07/18/20 1648)    And  lactated ringers bolus 500 mL (0 mLs Intravenous Stopped 07/18/20 1510)  diphenhydrAMINE (BENADRYL) injection 12.5 mg (12.5 mg Intravenous Given 07/18/20 1439)  famotidine (PEPCID) IVPB 20 mg premix (0 mg Intravenous Stopped 07/18/20 1510)  linezolid (ZYVOX) IVPB 600 mg (0 mg Intravenous Stopped 07/18/20 1934)  gadobutrol (GADAVIST) 1 MMOL/ML injection 10 mL (10 mLs Intravenous Contrast Given 07/18/20 1802)  clopidogrel (PLAVIX) tablet 75 mg (75 mg Oral Given 07/18/20 2127)  aspirin chewable tablet 81 mg (81 mg Oral Given 07/18/20 2127)    ED Course  I have reviewed the triage vital signs and the nursing notes.  Pertinent labs & imaging results that were available during my care of the patient were reviewed by me and considered in my medical decision making (see chart for details).    MDM Rules/Calculators/A&P                          85 y/o M presents to the ED today for eval of generalized weakness  Reviewed/interpreted labs CBC with mild leukocytosis, mild anemia which appears stable CMP with AKI on CKD, elevated BUN, likely prerenal, electrolytes and lfts wnl COVID neg UA pending on admission  Coags wnl Lactic elevated  Blood cultures obtained  EKG with sinus or ectopic atrial tachycardia, rbbb, no change from aug 2021, no stemi  Imaging  reviewed/interpreted  CXR - No active disease. CT head -  1. Large area of hypoattenuation in the right cerebellar cortex. More subtle area of hypoattenuation in the superior left cerebellum. These findings may represent areas of acute/subacute infarction or other destructive brain process. 2. No evidence of acute hemorrhage. 3. Dense consolidation in the left maxillary sinus with high density material, query fungal sinusitis. 4. Evaluation with brain MRI is recommended  - discussed case with radiologist, Dr. Fidela Salisbury who recommends MRI w and w/o contrast  Pt presenting with tachycardia, tachypnea, fever, with concern for possible infection. Code sepsis initiated. Broad spectrum abx ordered. 30 cc/kg bolus given based on ideal body weight.  At shift change, care transitioned to Suella Broad, PA-C with plan to f/u on MRI and admit.   Final Clinical Impression(s) / ED Diagnoses Final diagnoses:  Sepsis, due to unspecified organism, unspecified whether acute organ dysfunction present ([place])  AKI (acute kidney injury) ([place])  Vertebral artery stenosis, unspecified laterality  Cerebrovascular accident (CVA), unspecified mechanism [facility])    Rx / DC Orders ED Discharge Orders    None       Bishop Dublin 07/18/20 1746    Wyvonnia Dusky, MD 07/18/20 1946    Rodney Booze, PA-C 07/19/20 6004    Wyvonnia Dusky, MD 07/19/20 [phone]

## 2020-07-18 NOTE — ED Notes (Signed)
Patient began itching his left arm shortly after Vancomycin was started. Upon assessment patient has mild rash to the left forearm. Patient denies any other symptoms or rash anywhere else.

## 2020-07-18 NOTE — Sepsis Progress Note (Signed)
elink is monitoring this code sepsis. Thank you.

## 2020-07-18 NOTE — ED Notes (Signed)
Pt transported to MRI 

## 2020-07-19 ENCOUNTER — Inpatient Hospital Stay (HOSPITAL_COMMUNITY): Payer: Medicare Other

## 2020-07-19 DIAGNOSIS — N183 Chronic kidney disease, stage 3 unspecified: Secondary | ICD-10-CM

## 2020-07-19 DIAGNOSIS — L0390 Cellulitis, unspecified: Secondary | ICD-10-CM

## 2020-07-19 DIAGNOSIS — I739 Peripheral vascular disease, unspecified: Secondary | ICD-10-CM | POA: Diagnosis not present

## 2020-07-19 DIAGNOSIS — I6381 Other cerebral infarction due to occlusion or stenosis of small artery: Secondary | ICD-10-CM | POA: Diagnosis not present

## 2020-07-19 DIAGNOSIS — M1A40X0 Other secondary chronic gout, unspecified site, without tophus (tophi): Secondary | ICD-10-CM

## 2020-07-19 DIAGNOSIS — D696 Thrombocytopenia, unspecified: Secondary | ICD-10-CM

## 2020-07-19 DIAGNOSIS — A419 Sepsis, unspecified organism: Secondary | ICD-10-CM

## 2020-07-19 DIAGNOSIS — I6389 Other cerebral infarction: Secondary | ICD-10-CM

## 2020-07-19 DIAGNOSIS — N179 Acute kidney failure, unspecified: Secondary | ICD-10-CM | POA: Diagnosis not present

## 2020-07-19 DIAGNOSIS — I639 Cerebral infarction, unspecified: Secondary | ICD-10-CM | POA: Diagnosis not present

## 2020-07-19 DIAGNOSIS — E113553 Type 2 diabetes mellitus with stable proliferative diabetic retinopathy, bilateral: Secondary | ICD-10-CM | POA: Diagnosis not present

## 2020-07-19 DIAGNOSIS — I10 Essential (primary) hypertension: Secondary | ICD-10-CM

## 2020-07-19 LAB — LIPID PANEL
Cholesterol: 90 mg/dL (ref 0–200)
HDL: 38 mg/dL — ABNORMAL LOW (ref >40)
LDL Cholesterol: 43 mg/dL (ref 0–99)
Total CHOL/HDL Ratio: 2.4 RATIO
Triglycerides: 44 mg/dL (ref <150)
VLDL: 9 mg/dL (ref 0–40)

## 2020-07-19 LAB — HEMOGLOBIN A1C
Hgb A1c MFr Bld: 6.1 % — ABNORMAL HIGH (ref 4.8–5.6)
Mean Plasma Glucose: 128.37 mg/dL

## 2020-07-19 LAB — CBG MONITORING, ED
Glucose-Capillary: 159 mg/dL — ABNORMAL HIGH (ref 70–99)
Glucose-Capillary: 122 mg/dL — ABNORMAL HIGH (ref 70–99)
Glucose-Capillary: 125 mg/dL — ABNORMAL HIGH (ref 70–99)
Glucose-Capillary: 153 mg/dL — ABNORMAL HIGH (ref 70–99)

## 2020-07-19 LAB — GLUCOSE, CAPILLARY: Glucose-Capillary: 127 mg/dL — ABNORMAL HIGH (ref 70–99)

## 2020-07-19 LAB — ECHOCARDIOGRAM COMPLETE
Area-P 1/2: 4.15 cm2
Height: 73 in
Weight: 4400 oz

## 2020-07-19 IMAGING — CR DG TOE GREAT 2+V*L*
3 series · 3 of 3 positions shown · non-contrast
Comparison: [DATE]

CLINICAL DATA: Cellulitis of the foot. Left great toe wound for 8
weeks.

EXAM:
LEFT GREAT TOE

[x toes ap left]
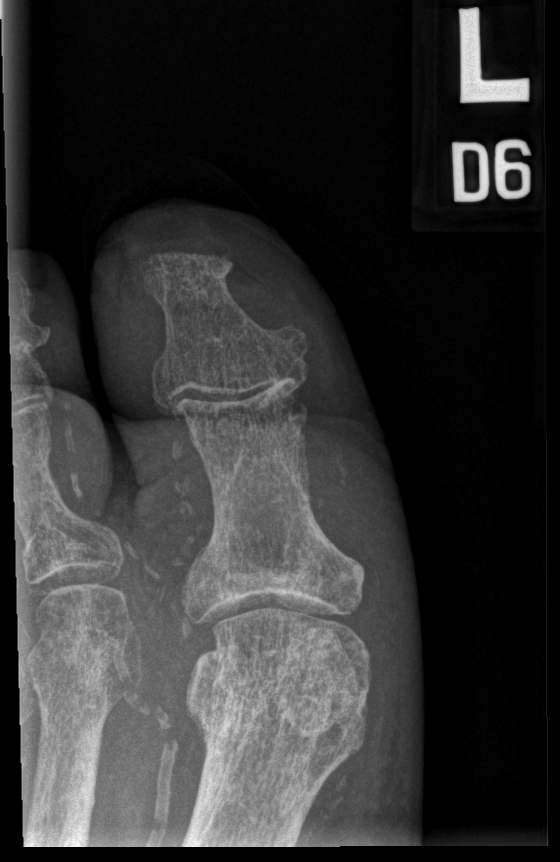

[x toes obl left]
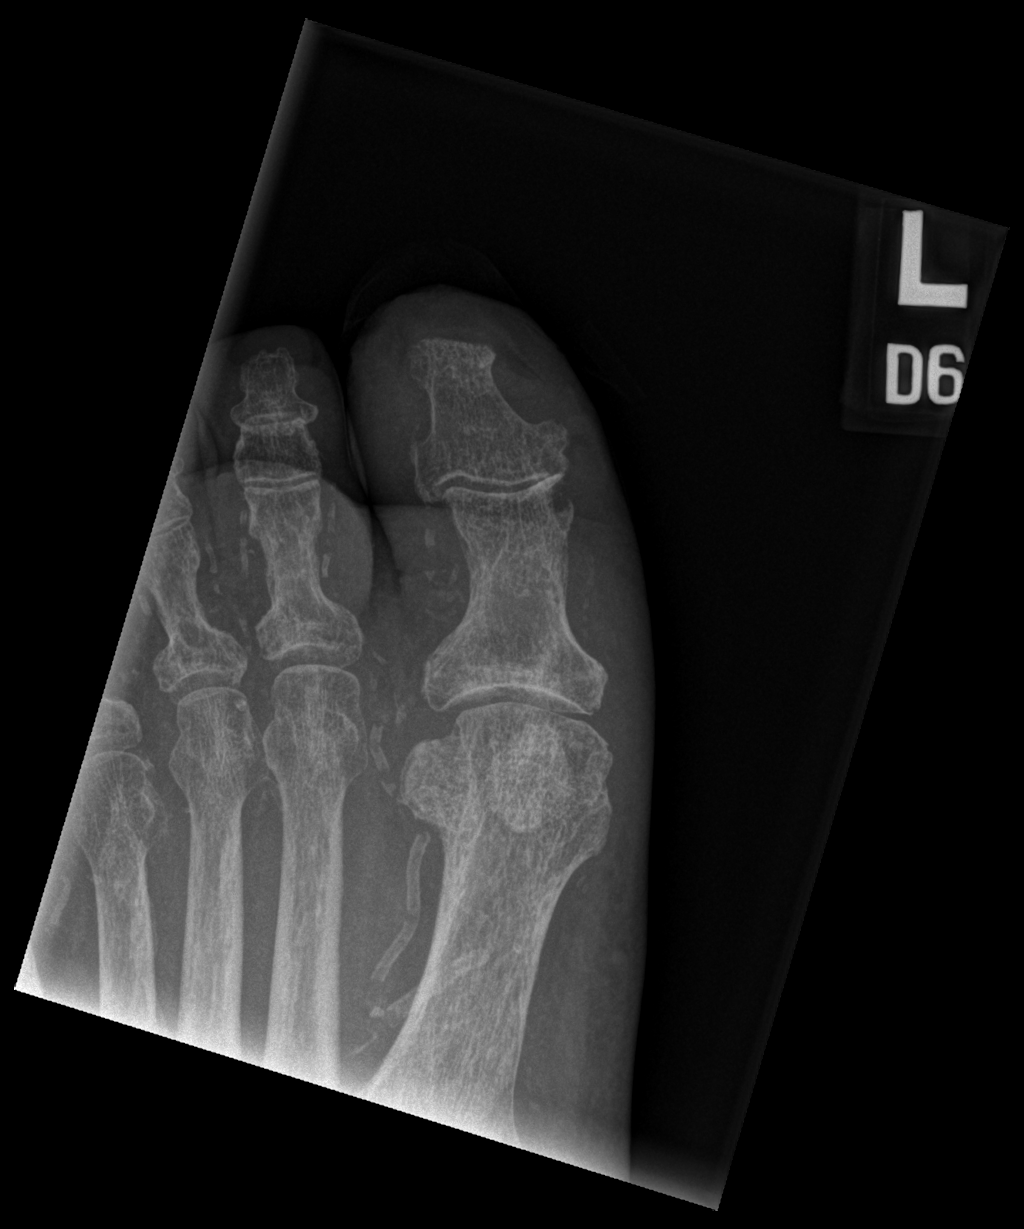

[x toes lat left]
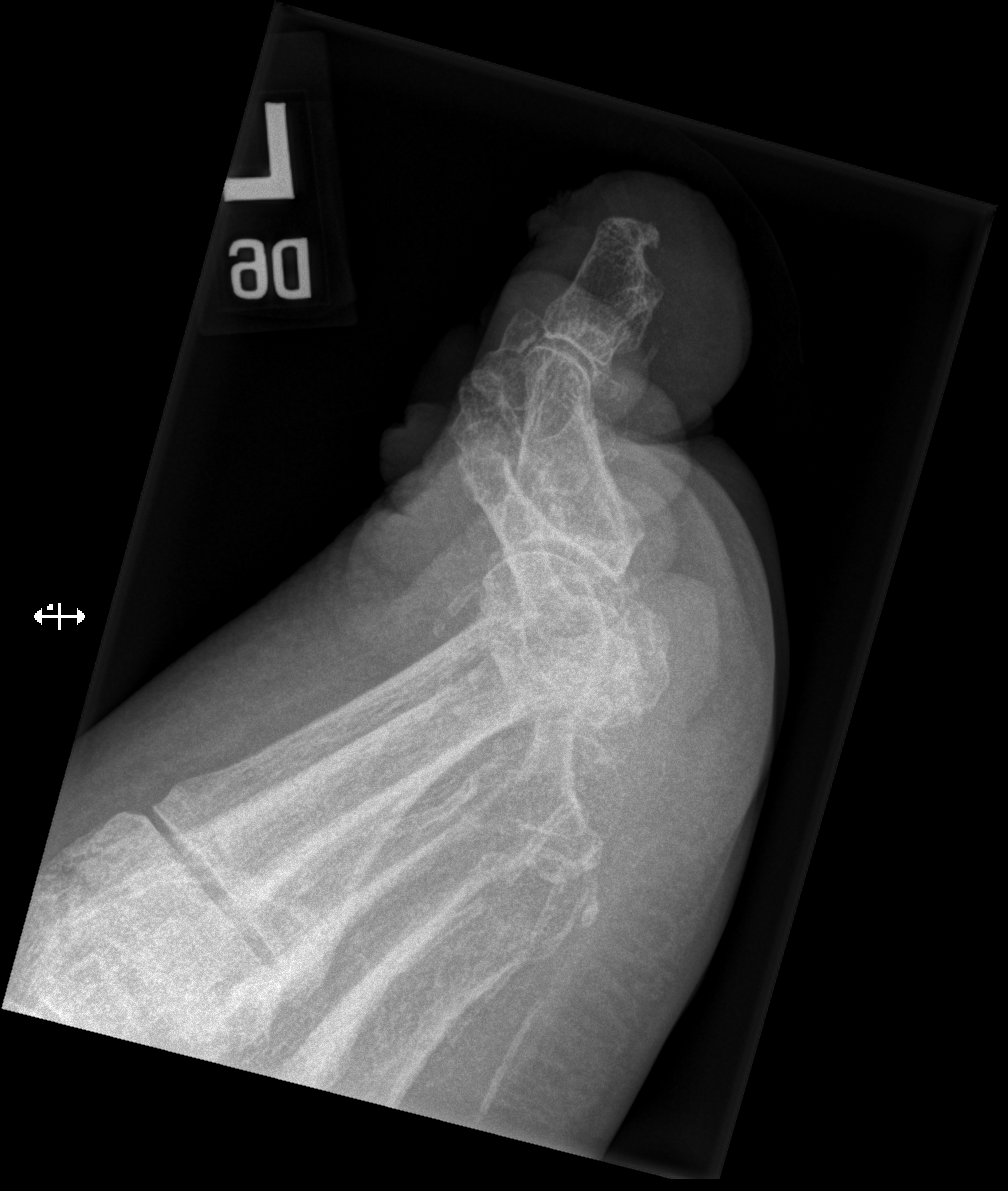

[3 of 3 positions shown; findings below may reference images not displayed]

FINDINGS: No acute fracture or dislocation. Generalized osteopenia. Erosion of
the distal medial corner of the first proximal phalanx new compared
with the prior examination of [DATE] concerning for
osteomyelitis versus a crystalline arthropathy such as gout.

Soft tissue are unremarkable. No radiopaque foreign body or soft
tissue emphysema. Peripheral vascular atherosclerotic disease.
IMPRESSION: 1. No acute osseous injury of the left great toe.
2. Erosion of the distal medial corner of the first proximal phalanx
new compared with the prior examination of [DATE] concerning for
osteomyelitis versus a crystalline arthropathy such as gout.

## 2020-07-19 MED ORDER — ENOXAPARIN SODIUM 40 MG/0.4ML ~~LOC~~ SOLN
40.0000 mg | Freq: Every day | SUBCUTANEOUS | Status: DC
  Administered 2020-07-19 – 2020-07-22 (×4): 40 mg via SUBCUTANEOUS
  Filled 2020-07-19 (×4): qty 0.4

## 2020-07-19 MED ORDER — SODIUM CHLORIDE 0.9 % IV SOLN
2.0000 g | INTRAVENOUS | Status: DC
  Administered 2020-07-19 – 2020-07-20 (×2): 2 g via INTRAVENOUS
  Filled 2020-07-19: qty 20
  Filled 2020-07-19: qty 0.1
  Filled 2020-07-19: qty 2

## 2020-07-19 MED ORDER — CLOPIDOGREL BISULFATE 75 MG PO TABS
75.0000 mg | ORAL_TABLET | Freq: Every day | ORAL | Status: DC
  Administered 2020-07-19 – 2020-07-22 (×4): 75 mg via ORAL
  Filled 2020-07-19 (×4): qty 1

## 2020-07-19 NOTE — Progress Notes (Signed)
  Echocardiogram 2D Echocardiogram has been performed.  Micheal Walls 07/19/2020, 9:46 AM

## 2020-07-19 NOTE — Hospital Course (Addendum)
85 year old man PMH diabetes mellitus with peripheral vascular disease status post BKA, diastolic CHF presented to emergency department after waking up feeling weak.  Admitted for SIRS, suspected cerebral infarct, right vertebral artery stenosis, acute kidney injury  A & P  Subacute right frontal stroke, severe stenosis versus occlusion right distal intradural vertebral artery.  Thought secondary to poorly controlled diabetes, hypertension --Neurology recommended aspirin and Plavix for 3 weeks then aspirin 81 mg daily alone, continue statin, follow-up TTE, DVT prophylaxis and requested transfer to [facility]. --Per nursing cleared swallow screen.  Start diet.  Sepsis secondary to diabetic left foot infection --Ulcer on distal tip of left toe appears to be infected.  Foot otherwise appears uninvolved.  Perfusion appears intact.  Lower extremity wound protocol initiated.  Check ABI. --Narrow antibiotics.  Wound care consult.  AKI superimposed on CKD, likely stage IIIb --Gentle IV fluids.  Hold diuretic.  Repeat BMP in a.m.  Thrombocytopenia, seen also May 2021. --Etiology unclear.  Could be connected to sepsis or may be chronic. --Trend CBC.  Diabetes mellitus type 2 with peripheral vascular disease, status post right BKA, peripheral neuropathy --Hold gabapentin in setting of acute kidney injury.  Hold semaglutide. --Sliding scale insulin.  Gout --Allopurinol on hold in the setting of AKI  Essential hypertension.  BP meds on hold, permissive hypertension.  Glaucoma --Continue eye gtts

## 2020-07-19 NOTE — Progress Notes (Signed)
Carotid duplex complete   Please see CV Proc for preliminary results.   Vonzell Schlatter, RVT

## 2020-07-19 NOTE — Progress Notes (Signed)
PROGRESS NOTE  Micheal Walls GLO:[id number] DOB: [birth year] DOA: 07/18/2020 PCP: [name], [name]  Brief History   85 year old man PMH diabetes mellitus with peripheral vascular disease status post BKA, diastolic CHF presented to emergency department after waking up feeling weak.  Admitted for SIRS, suspected cerebral infarct, right vertebral artery stenosis, acute kidney injury  A & P  Subacute right frontal stroke, severe stenosis versus occlusion right distal intradural vertebral artery.  Thought secondary to poorly controlled diabetes, hypertension --Neurology recommended aspirin and Plavix for 3 weeks then aspirin 81 mg daily alone, continue statin, follow-up TTE, DVT prophylaxis and requested transfer to [facility]. --Per nursing cleared swallow screen.  Start diet.  Sepsis secondary to diabetic left foot infection --Ulcer on distal tip of left toe appears to be infected.  Foot otherwise appears uninvolved.  Perfusion appears intact.  Lower extremity wound protocol initiated.  Check ABI. --Narrow antibiotics.  Wound care consult.  AKI superimposed on CKD, likely stage IIIb --Gentle IV fluids.  Hold diuretic.  Repeat BMP in a.m.  Thrombocytopenia, seen also May 2021. --Etiology unclear.  Could be connected to sepsis or may be chronic. --Trend CBC.  Diabetes mellitus type 2 with peripheral vascular disease, status post right BKA, peripheral neuropathy --Hold gabapentin in setting of acute kidney injury.  Hold semaglutide. --Sliding scale insulin.  Gout --Allopurinol on hold in the setting of AKI  Essential hypertension.  BP meds on hold, permissive hypertension.  Glaucoma --Continue eye gtts  Nutritional Assessment: Body mass index is 36.28 kg/m.Marland Kitchen Seen by dietician.  I agree with the assessment and plan as outlined below: Nutrition Status:       Skin Assessment: I have examined the patient's skin and I agree with the wound assessment as performed by the wound care  RN as outlined below:     Disposition Plan:  Discussion: Appears to be stabilizing.  Narrow antibiotics.  Further evaluate wound with x-ray, ABIs, wound care consult.  Further evaluation per neurology.  Status is: Inpatient  Remains inpatient appropriate because:IV treatments appropriate due to intensity of illness or inability to take PO and Inpatient level of care appropriate due to severity of illness   Dispo: The patient is from: Home              Anticipated d/c is to: TBD              Anticipated d/c date is: 2 days              Patient currently is not medically stable to d/c.   Difficult to place patient No   DVT prophylaxis: enoxaparin (LOVENOX) injection 40 mg Start: 07/19/20 1445 SCD's Start: 07/18/20 2319   Code Status: Full Code Level of care: Telemetry Medical Family Communication:   Murray Hodgkins, MD  [facility] Direct contact: see [email or web address] (further directions at bottom of note if needed) 7PM-7AM contact night coverage as at bottom of note 07/19/2020, 2:45 PM  LOS: 1 day   Significant Hospital Events   . 2/5 admit for sepsis, stroke   Consults:  . Neurology    Procedures:  .   Significant Diagnostic Tests:   2/5 chest x-ray no acute disease . 2/5 CT head possible acute/subacute infarction right cerebellar cortex, left cerebellum.  No hemorrhage.  Dense consolidation left maxillary sinus. . 2/5 MRI brain remote large bilateral cerebellar infarcts, remote pontine infarct.  Subacute infarct.  MRA negative for large vessel occlusion.   Micro Data:  Urinalysis negative 2/5 urine  culture 2/5 blood culture    Antimicrobials:  . Cefepime 2/5 . Metronidazole 2/5  Linezolid 2/5  Interval History/Subjective  CC: f/u stroke  Feels well, no complaints.  Reports ulcer on left great toe which he has been going to the wound care clinic for, this is on the top of the toe.  Notes recent ulcer on the end of the toe.  Objective   Vitals:  Vitals:    07/19/20 1415 07/19/20 1430  BP: (!) 167/71 (!) 177/87  Pulse: 86 84  Resp: 16 13  Temp:    SpO2: 96% 95%    Exam:  Constitutional:   . Appears calm and comfortable in ED room Eyes:  Marland Kitchen Appear grossly normal ENMT:  . grossly normal hearing  . Lips appear normal . Tongue appears normal Respiratory:  . CTA bilaterally, no w/r/r.  . Respiratory effort normal.  Cardiovascular:  . RRR, no m/r/g . No LLE extremity edema, no right stump edema   Abdomen:  . Soft ntnd Musculoskeletal:  . RUE, LUE, RLE, LLE   . strength and tone grossly normal Neurologic:  . CN 2-12 grossly intact Psychiatric:  . Mental status o Mood, affect appropriate  I have personally reviewed the following:   Today's Data  . CBG stable Creatinine elevated 2.65, remainder CMP unremarkable LDL 43 Platelets 123, stable.  Remainder CBC notable only for WBC of 11.8. Covid negative. Urinalysis negative.   Hemoglobin A1c 6.1. Blood cultures urine culture pending   Scheduled Meds: .  stroke: mapping our early stages of recovery book   Does not apply Once  . aspirin EC  81 mg Oral Daily  . clopidogrel  75 mg Oral Daily  . diltiazem  300 mg Oral Daily  . dorzolamide  1 drop Both Eyes BID  . enoxaparin (LOVENOX) injection  40 mg Subcutaneous Q24H  . insulin aspart  0-6 Units Subcutaneous TID WC  . latanoprost  1 drop Both Eyes QHS  . multivitamin with minerals  1 tablet Oral Daily  . omega-3 acid ethyl esters  2 g Oral Daily  . pantoprazole  40 mg Oral QHS  . [START ON 07/20/2020] rosuvastatin  10 mg Oral Q M,W,F   Continuous Infusions: . sodium chloride 50 mL/hr at 07/19/20 0928  . cefTRIAXone (ROCEPHIN)  IV    . metronidazole Stopped (07/19/20 1103)    Principal Problem:   Sepsis ([place]) Active Problems:   S/P unilateral BKA (below knee amputation) ([place])   Essential hypertension   Gout   CKD (chronic kidney disease), stage III (HCC)   RBBB   CKD (chronic kidney disease) stage 4, GFR 15-29  ml/min (HCC)   PAD (peripheral artery disease) (HCC)   AKI (acute kidney injury) ([place])   Controlled type 2 diabetes mellitus with stable proliferative retinopathy of both eyes, with long-term current use of insulin ([place])   Diabetic ulcer of left great toe ([place])   Cerebral ischemia   Thrombocytopenia ([place])   LOS: 1 day   How to contact the [facility] Attending or Consulting provider 7A - 7P or covering provider during after hours 7P -7A, for this patient?  1. Check the care team in [facility] and look for a) attending/consulting TR[facility] provider listed and b) the [facility] team listed 2. Log into [email or web address] and use [facility]'s universal password to access. If you do not have the password, please contact the hospital operator. 3. Locate the [facility] provider you are looking for under [facility] and page to a  number that you can be directly reached. 4. If you still have difficulty reaching the provider, please page the [facility] (Director on Call) for the Hospitalists listed on amion for assistance.

## 2020-07-19 NOTE — Progress Notes (Signed)
ABI  Completed   Please see CV Proc for preliminary results.   Vonzell Schlatter, RVT

## 2020-07-19 NOTE — Consult Note (Signed)
NEUROLOGY CONSULTATION NOTE   Date of service: July 19, 2020 Patient Name: Micheal Walls MRN:  [id number] DOB:  10/05/1935 Reason for consult: "Stroke on MRI" _ _ _   _ __   _ __ _ _  __ __   _ __   __ _  History of Present Illness  Micheal Walls is a 85 y.o. male with PMH significant for right below knee amputation, DM2, gout, HLD, OSA on CPAP, HTN who presents with generalized weakness. He reports he had chills all night when he went to bed day before yesterday. He could not sleep at all and was shivering all night. He woke in the morning on 07/18/20 feeling tired and weak all over and could not get out of the bed. He was brought in to the ED where he had fever to 100.9. Labs with AKI, elevated lactate. CT Head was obtained as part of the workup and demonstrated BL cerebellar strokes. MRI Brain was obtained that shows that he had remote BL cerebellar strokes along with a remote pontine stroke. He does however, have a subacute small R frontal stroke with no ADC correlate. MR angio demonstrated diminutive vertebrobasilar system with BL fetal PCAs and a sever stenosis vs occlusion of the R distal intradural vertebral artery.  Neurology was consulted to assisst with management.  He endorses prior hs of strokes and has had some slurring of speech since earlier 200s. He also has had trouble with coordination on both sides that is old and his R side has always been worse than his left, none of this is new.   ROS   Constitutional Denies weight loss, fever and chills.   HEENT Denies changes in vision and hearing.   Respiratory Denies SOB and cough.   CV Denies palpitations and CP   GI Denies abdominal pain, nausea, vomiting and diarrhea.   GU Denies dysuria and urinary frequency.   MSK Denies myalgia and joint pain.   Skin Denies rash and pruritus.   Neurological Denies headache and syncope.   Psychiatric Denies recent changes in mood. Denies anxiety and depression.    Past History    Past Medical History:  Diagnosis Date  . Acute metabolic encephalopathy 12/11/2456  . Amputated below knee ([place])    right  . Anemia   . Aspiration pneumonia ([place]) 11/02/2019  . Diastolic heart failure ([place])   . Diverticulosis   . DM (diabetes mellitus) ([place])   . Gout   . Hyperlipemia   . OSA on CPAP   . RBBB   . Sepsis ([place]) 11/02/2019  . Systemic hypertension    Past Surgical History:  Procedure Laterality Date  . LEG AMPUTATION BELOW KNEE     right  . NM MYOCAR PERF WALL MOTION  09/18/2009   no ischemia   Family History  Problem Relation Age of Onset  . Diabetes Mother   . Heart attack Father   . Cancer Sister    Social History   Socioeconomic History  . Marital status: Married    Spouse name: Not on file  . Number of children: Not on file  . Years of education: Not on file  . Highest education level: Not on file  Occupational History  . Occupation: retired  Tobacco Use  . Smoking status: Former Smoker    Types: Cigars    Quit date: 06/12/2001    Years since quitting: 19.1  . Smokeless tobacco: Never Used  Vaping Use  . Vaping Use: Former  Substance and Sexual Activity  . Alcohol use: No    Alcohol/week: 0.0 standard drinks  . Drug use: No  . Sexual activity: Not Currently  Other Topics Concern  . Not on file  Social History Narrative  . Not on file   Social Determinants of Health   Financial Resource Strain: Low Risk   . Difficulty of Paying Living Expenses: Not hard at all  Food Insecurity: No Food Insecurity  . Worried About Charity fundraiser in the Last Year: Never true  . Ran Out of Food in the Last Year: Never true  Transportation Needs: No Transportation Needs  . Lack of Transportation (Medical): No  . Lack of Transportation (Non-Medical): No  Physical Activity: Inactive  . Days of Exercise per Week: 0 days  . Minutes of Exercise per Session: 0 min  Stress: No Stress Concern Present  . Feeling of Stress : Not at all  Social  Connections: Not on file   Allergies  Allergen Reactions  . Vancomycin Rash    Medications  (Not in a hospital admission)    Vitals   Vitals:   07/18/20 1900 07/18/20 1930 07/18/20 2000 07/18/20 2300  BP: (!) 161/72 (!) 167/95 (!) 180/73 (!) 166/91  Pulse: 96 96 99 93  Resp: 16 (!) 21 (!) 21 18  Temp:      TempSrc:      SpO2: 100% 96% 97% 98%  Weight:      Height:         Body mass index is 36.28 kg/m.  Physical Exam   General: Laying comfortably in bed; in no acute distress.  HENT: Normal oropharynx and mucosa. Normal external appearance of ears and nose.  Neck: Supple, no pain or tenderness  CV: No JVD. No peripheral edema.  Pulmonary: Symmetric Chest rise. Normal respiratory effort.  Abdomen: Soft to touch, non-tender.  Ext: No cyanosis, edema, or deformity  Skin: No rash. Normal palpation of skin.   Musculoskeletal: Normal digits and nails by inspection. No clubbing.   Neurologic Examination  Mental status/Cognition: Alert, oriented to self, place, month and year, good attention. Unable to do simple calculations. Speech/language: Mild dysarthria, fluent, comprehension intact, object naming intact, repetition intact.  Cranial nerves:   CN II Pupils equal and reactive to light, no VF deficits    CN III,IV,VI EOM intact, no gaze preference or deviation, no nystagmus    CN V normal sensation in V1, V2, and V3 segments bilaterally    CN VII no asymmetry, no nasolabial fold flattening   CN VIII normal hearing to speech   CN IX & X normal palatal elevation, no uvular deviation   CN XI 5/5 head turn and 5/5 shoulder shrug bilaterally   CN XII midline tongue protrusion   Motor:  Muscle bulk: normal, tone normal, pronator drift none tremor yes, intention tremor. Mvmt Root Nerve  Muscle Right Left Comments  SA C5/6 Ax Deltoid 5 5   EF C5/6 Mc Biceps 5 5   EE C6/7/8 Rad Triceps 5 5   WF C6/7 Med FCR 5 5   WE C7/8 PIN ECU 5 5   F Ab C8/T1 U ADM/FDI 5 5   HF L1/2/3  Fem Illopsoas 5 5   KE L2/3/4 Fem Quad 5 5   DF L4/5 D Peron Tib Ant  5 R below knee amputation.  PF S1/2 Tibial Grc/Sol  5    Reflexes:  Right Left Comments  Pectoralis  Biceps (C5/6) 2 2   Brachioradialis (C5/6) 2 2    Triceps (C6/7) 2 2    Patellar (L3/4) 2 2    Achilles (S1)      Hoffman      Plantar     Jaw jerk    Sensation:  Light touch Intact throughout   Pin prick    Temperature    Vibration   Proprioception    Coordination/Complex Motor:  - Finger to Nose with ataxia and past pointing BL with R worse than left. - Heel to shin unable to do given amputation - Rapid alternating movement are slowed throughout - Gait: Deferred.  Labs   CBC:  Recent Labs  Lab 07/18/20 1413  WBC 11.8*  NEUTROABS 10.3*  HGB 11.4*  HCT 34.5*  MCV 95.0  PLT 123*    Basic Metabolic Panel:  Lab Results  Component Value Date   NA 139 07/18/2020   K 4.0 07/18/2020   CO2 24 07/18/2020   GLUCOSE 187 (H) 07/18/2020   BUN 66 (H) 07/18/2020   CREATININE 2.65 (H) 07/18/2020   CALCIUM 10.2 07/18/2020   GFRNONAA 23 (L) 07/18/2020   GFRAA 42 (L) 05/20/2020   Lipid Panel:  Lab Results  Component Value Date   LDLCALC 70 05/20/2020   HgbA1c:  Lab Results  Component Value Date   HGBA1C 6.2 (H) 05/20/2020   Urine Drug Screen: No results found for: LABOPIA, COCAINSCRNUR, LABBENZ, AMPHETMU, THCU, LABBARB  Alcohol Level No results found for: [facility]   MR angio Head and Carotid Duplex: R vert significant stenosis vs occlusion.  MRI Brain  Small area of DWI hyperintensity without definite ADC correlate in the right frontal lobe, either T2 shine through/artifact or subacute infarct.  Impression   Micheal Walls is a 85 y.o. male with PMH significant for for right below knee amputation, DM2, gout, HLD, OSA on CPAP, HTN who presents with generalized weakness. He was incidentally found to have a small R frontal subcortical white matter stroke. His neurologic examination is  notable for Ataxia BL with Right worse than left. I do not think that the stroke would explain the noted generalized weakness.  Suspect that this stroke is likely due to poorly controlled risk factors including Diabetes, HTN, OSA. Okay to get basic stroke workup at this time.  Recommendations   Small R frontal subcortical stroke: - MRI Brain with a small R frontal subcortical infarct. - Vascular imaging with MRA Angio Head without contrast and US Carotid doppler has been ordered and pending. - TTE pending - Lipid panel is pending - Please increase Rosuvastatin to $RemoveBeforeD'20mg'[id number]$  once daily if LDL > 70 - HbA1c is pending. - Aspirin and plavix for 21 days, then aspirin $RemoveBefo'81mg'MsxeHSLCIDI$  daily alone - Recommend DVT ppx - SBP goal - gradual normotension. - Recommend Telemetry monitoring for arrythmia - Stroke education booklet - Recommend PT/OT/SLP consult   _______________________________________________________________   Thank you for the opportunity to take part in the care of this patient. If you have any further questions, please contact the neurology consultation attending.  Signed,  [place] Pager Number [phone] _ _ _   _ __   _ __ _ _  __ __   _ __   __ _

## 2020-07-19 NOTE — Progress Notes (Signed)
PT Cancellation Note  Patient Details Name: Micheal Walls MRN: [id number] DOB: [birth year]   Cancelled Treatment:     PT order received and eval attempted but deferred - Echo in progress and RN reports pt to transfer to [place].  Will follow.   Tanica Gaige 07/19/2020, 4:10 PM

## 2020-07-20 ENCOUNTER — Telehealth: Payer: Self-pay | Admitting: Podiatry

## 2020-07-20 ENCOUNTER — Inpatient Hospital Stay (HOSPITAL_COMMUNITY): Payer: Medicare Other

## 2020-07-20 DIAGNOSIS — A419 Sepsis, unspecified organism: Secondary | ICD-10-CM | POA: Diagnosis not present

## 2020-07-20 DIAGNOSIS — R6520 Severe sepsis without septic shock: Secondary | ICD-10-CM | POA: Diagnosis not present

## 2020-07-20 DIAGNOSIS — I6509 Occlusion and stenosis of unspecified vertebral artery: Secondary | ICD-10-CM | POA: Diagnosis not present

## 2020-07-20 DIAGNOSIS — I6330 Cerebral infarction due to thrombosis of unspecified cerebral artery: Secondary | ICD-10-CM | POA: Diagnosis not present

## 2020-07-20 DIAGNOSIS — M86679 Other chronic osteomyelitis, unspecified ankle and foot: Secondary | ICD-10-CM

## 2020-07-20 LAB — GLUCOSE, CAPILLARY
Glucose-Capillary: 108 mg/dL — ABNORMAL HIGH (ref 70–99)
Glucose-Capillary: 126 mg/dL — ABNORMAL HIGH (ref 70–99)
Glucose-Capillary: 116 mg/dL — ABNORMAL HIGH (ref 70–99)
Glucose-Capillary: 146 mg/dL — ABNORMAL HIGH (ref 70–99)

## 2020-07-20 LAB — SEDIMENTATION RATE: Sed Rate: 29 mm/hr — ABNORMAL HIGH (ref 0–16)

## 2020-07-20 LAB — BASIC METABOLIC PANEL
Anion gap: 11 (ref 5–15)
BUN: 49 mg/dL — ABNORMAL HIGH (ref 8–23)
CO2: 21 mmol/L — ABNORMAL LOW (ref 22–32)
Calcium: 9.9 mg/dL (ref 8.9–10.3)
Chloride: 109 mmol/L (ref 98–111)
Creatinine, Ser: 2.04 mg/dL — ABNORMAL HIGH (ref 0.61–1.24)
GFR, Estimated: 32 mL/min — ABNORMAL LOW (ref >60)
Glucose, Bld: 163 mg/dL — ABNORMAL HIGH (ref 70–99)
Potassium: 3.8 mmol/L (ref 3.5–5.1)
Sodium: 141 mmol/L (ref 135–145)

## 2020-07-20 LAB — URINE CULTURE

## 2020-07-20 LAB — C-REACTIVE PROTEIN: CRP: 21.8 mg/dL — ABNORMAL HIGH (ref <1.0)

## 2020-07-20 IMAGING — MR MR FOOT*L* W/O CM
4 of 6 series · 19 of 40 positions shown · non-contrast
Comparison: [DATE] radiographs

CLINICAL DATA: Osteomyelitis, great toe draining wound.

EXAM:
MRI OF THE LEFT FOOT WITHOUT CONTRAST
TECHNIQUE: Multiplanar, multisequence MR imaging of the left forefoot was
performed. No intravenous contrast was administered.

[Series 4: T1 · oblique · 4.0mm · 0.29mm/px · 9 of 30 slices shown (1 of 2)]
[im 1/30]
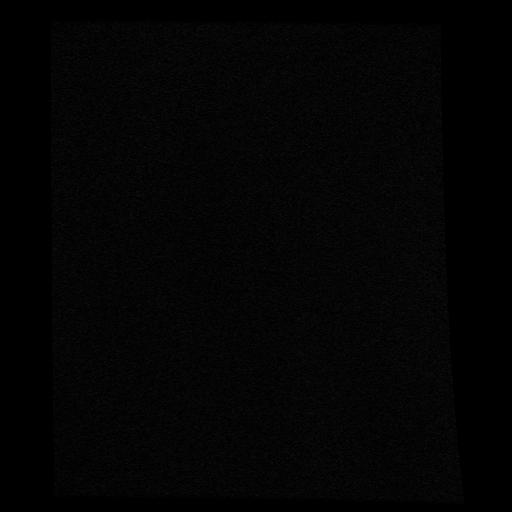
[im 4/30]
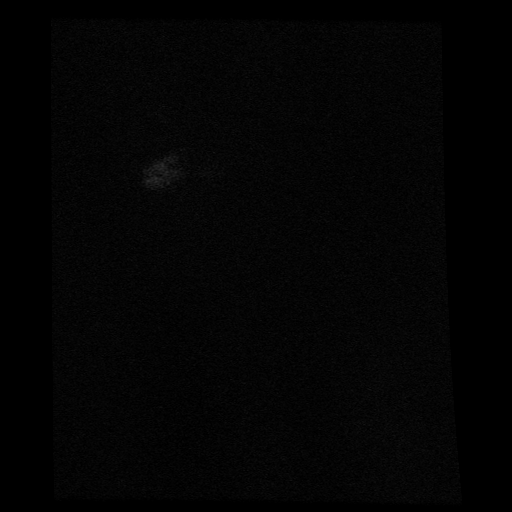
[im 8/30]
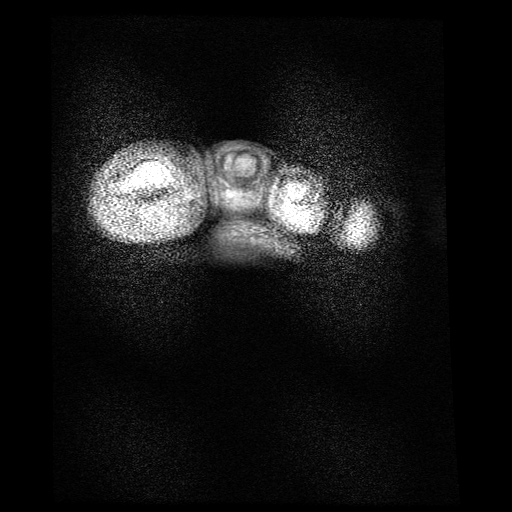
[im 11/30]
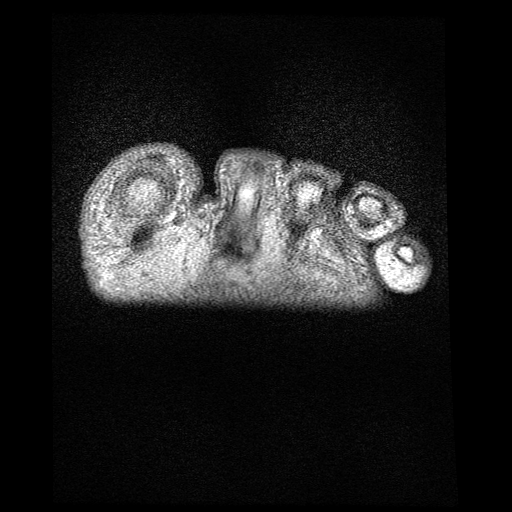
[im 15/30]
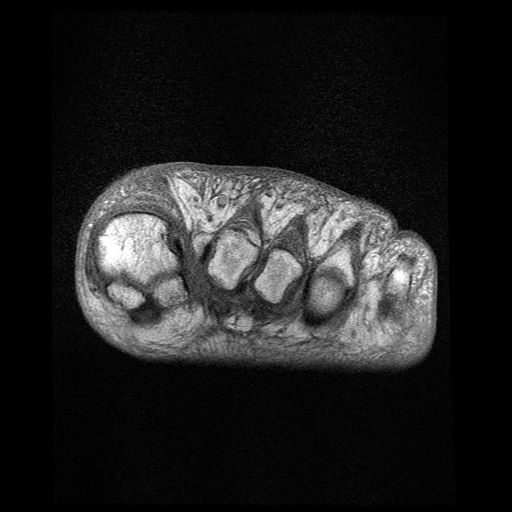
[im 19/30]
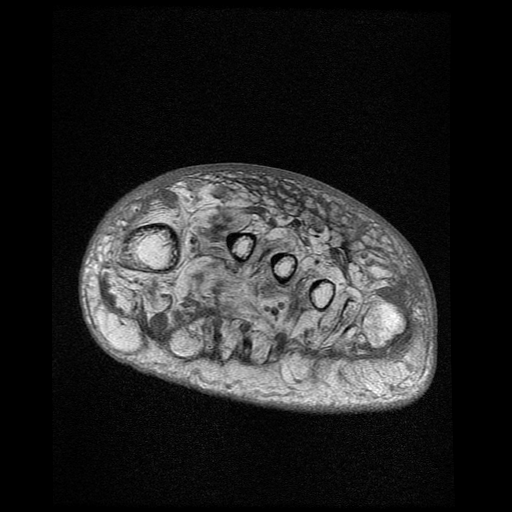
[im 22/30]
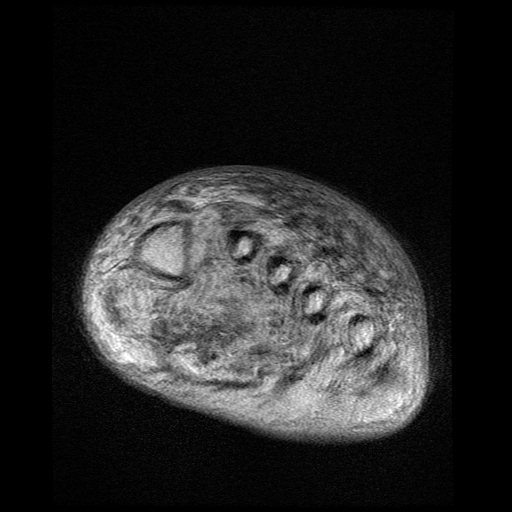
[im 26/30]
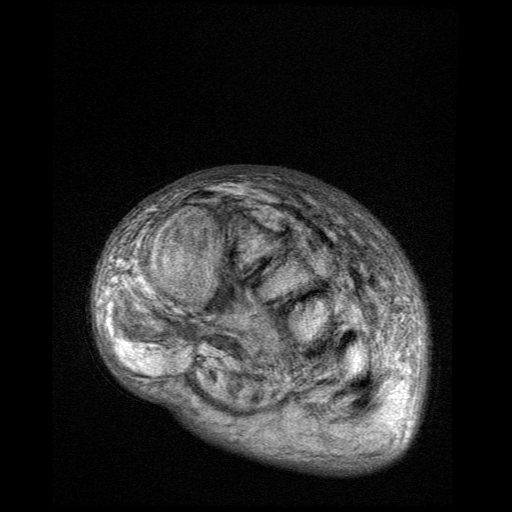
[im 30/30]
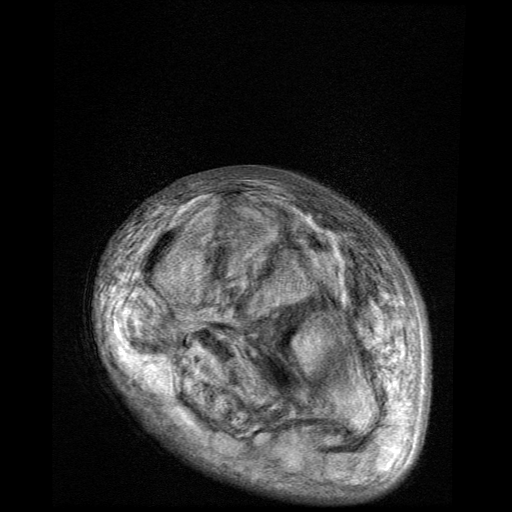

[Series 5: T2 · oblique · 4.0mm · 0.29mm/px · 3 of 32 slices shown (1 of 2)]
[im 4/32]
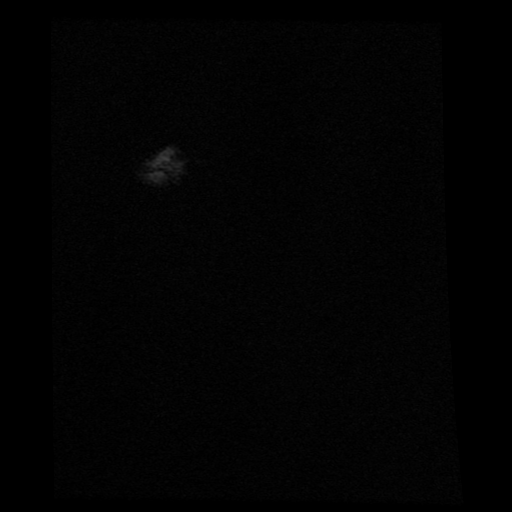
[im 16/32]
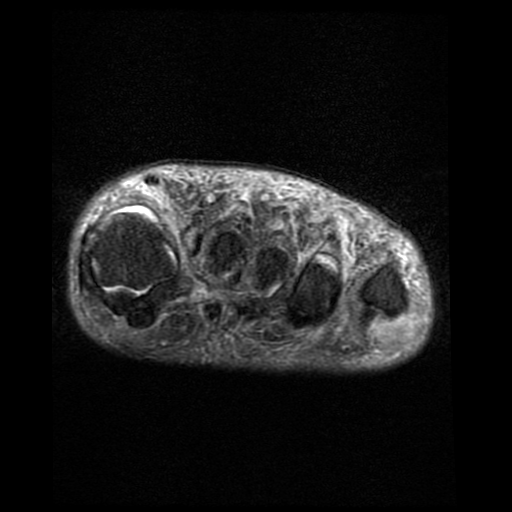
[im 28/32]
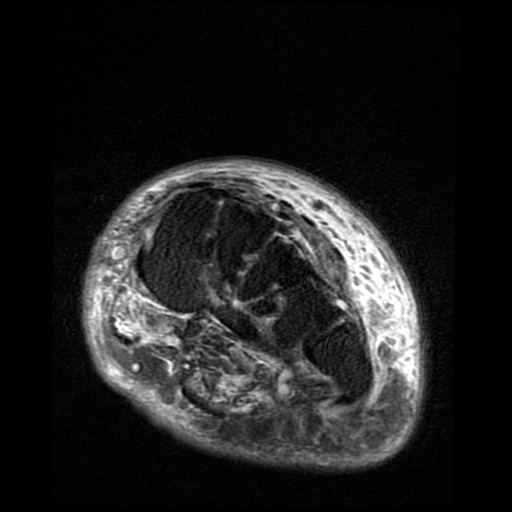

[Series 6: T1 · oblique · 4.0mm · 0.31mm/px · 4 of 18 slices shown (2 of 2)]
[im 1/18]
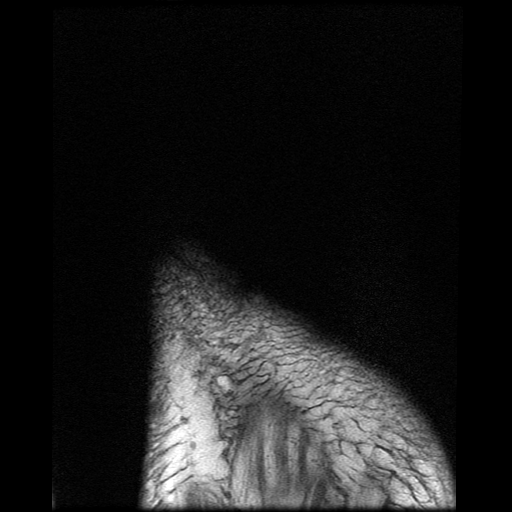
[im 5/18]
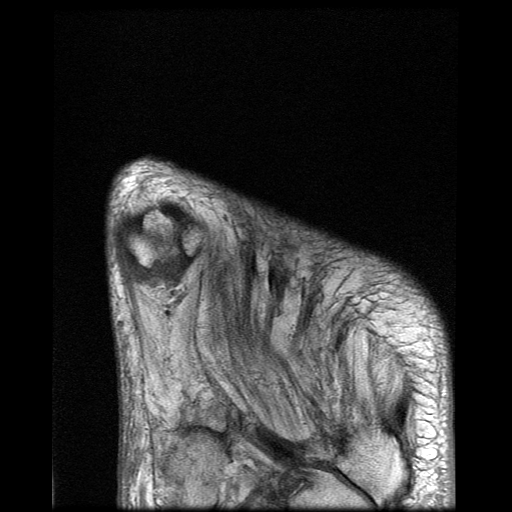
[im 9/18]
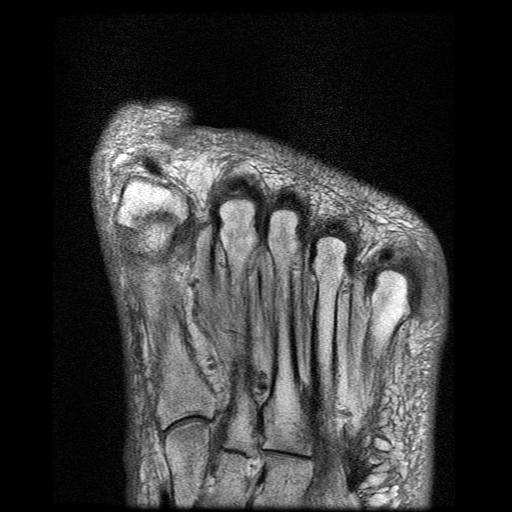
[im 18/18]
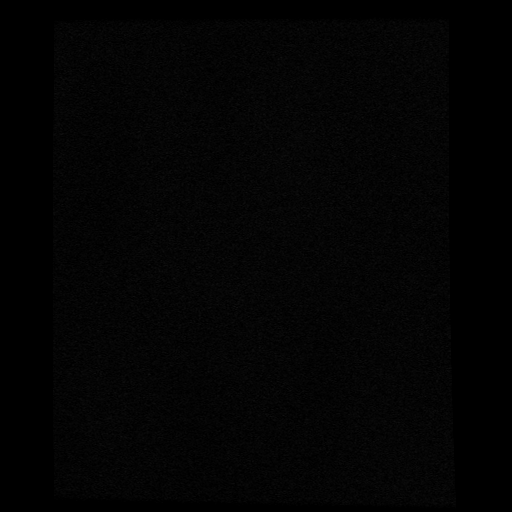

[Series 7: T2 · oblique · 4.0mm · 0.31mm/px · 3 of 18 slices shown (2 of 2)]
[im 1/18]
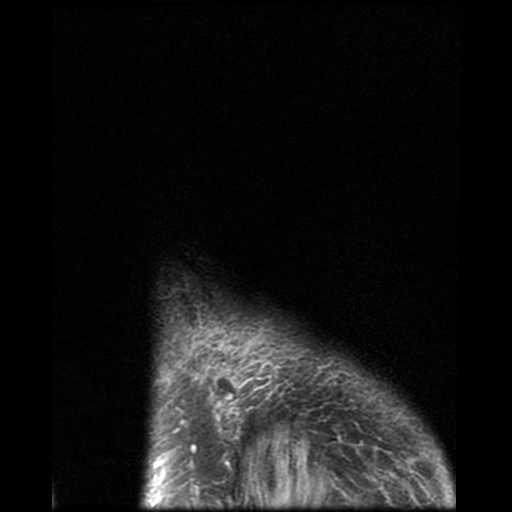
[im 9/18]
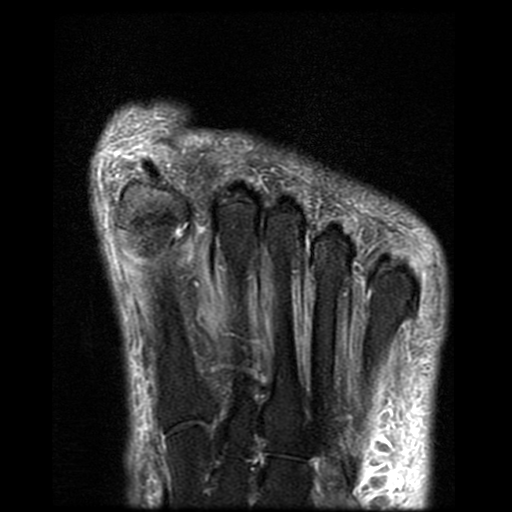
[im 18/18]
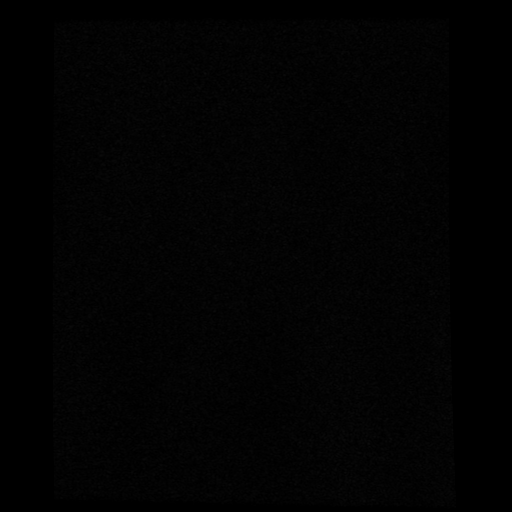

[19 of 40 positions shown; findings below may reference images not displayed]

FINDINGS: Bones/Joint/Cartilage

Abnormal marrow edema in the distal and proximal phalanges of the
great toe centered at the interphalangeal joint. The patient has a
known new medial erosion of the head of the proximal phalanx of the
great toe. There is no joint effusion. No other significant abnormal
osseous edema along the forefoot.

Ligaments

The Lisfranc ligament appears intact.

Muscles and Tendons

Low-grade edema signal along the plantar musculature of the foot,
probably neurogenic. Muscular atrophy.

Soft tissues

Considerable subcutaneous edema in the forefoot, especially
dorsally, extending into the toes. No visible drainable abscess.
IMPRESSION: 1. Abnormal marrow edema in the distal and proximal phalanges of the
great toe centered at the interphalangeal joint. The patient has a
known new medial erosion of the head of the proximal phalanx of the
great toe. Gout or osteomyelitis could cause this imaging
appearance; presence of the wound along the great toe tends to favor
infection, and if there is drainage then the likelihood of infection
increases further.
2. Subcutaneous edema in the forefoot, especially dorsally,
extending into the toes. No visible drainable abscess.
3. Low-grade edema signal along the plantar musculature of the foot,
probably neurogenic.

## 2020-07-20 MED ORDER — ENSURE MAX PROTEIN PO LIQD
11.0000 [oz_av] | Freq: Three times a day (TID) | ORAL | Status: DC
  Administered 2020-07-20 – 2020-07-22 (×7): 11 [oz_av] via ORAL
  Filled 2020-07-20 (×9): qty 330

## 2020-07-20 MED ORDER — METRONIDAZOLE 500 MG PO TABS
500.0000 mg | ORAL_TABLET | Freq: Three times a day (TID) | ORAL | Status: DC
  Administered 2020-07-20 – 2020-07-21 (×4): 500 mg via ORAL
  Filled 2020-07-20 (×4): qty 1

## 2020-07-20 MED ORDER — PROSOURCE PLUS PO LIQD
30.0000 mL | Freq: Three times a day (TID) | ORAL | Status: DC
  Administered 2020-07-20 – 2020-07-22 (×5): 30 mL via ORAL
  Filled 2020-07-20 (×3): qty 30

## 2020-07-20 NOTE — Progress Notes (Addendum)
STROKE TEAM PROGRESS NOTE   INTERVAL HISTORY No acute events in past 24 hours. Podiatry consulted to evaluate for possible osteomyelitis of left first toe.  Patient sitting up in bed, watching tv without distress. He denies new concerns or symptoms but does continue to feel generally weak, although he describes it as improved.  No visitors at bedside.   Vitals:   07/20/20 0321 07/20/20 0721 07/20/20 0855 07/20/20 1205  BP: (!) 119/52 (!) 125/50 (!) 125/55 135/67  Pulse: 90 87  94  Resp: $Remo'17 16  18  'kgCHz$ Temp: 98.4 F (36.9 C) 98.1 F (36.7 C)  98.4 F (36.9 C)  TempSrc: Oral Oral  Oral  SpO2: 98% 97%  98%  Weight:      Height:       CBC:  Recent Labs  Lab 07/18/20 1413  WBC 11.8*  NEUTROABS 10.3*  HGB 11.4*  HCT 34.5*  MCV 95.0  PLT 426*   Basic Metabolic Panel:  Recent Labs  Lab 07/18/20 1413 07/20/20 0950  NA 139 141  K 4.0 3.8  CL 103 109  CO2 24 21*  GLUCOSE 187* 163*  BUN 66* 49*  CREATININE 2.65* 2.04*  CALCIUM 10.2 9.9   Lipid Panel:  Recent Labs  Lab 07/19/20 0330  CHOL 90  TRIG 44  HDL 38*  CHOLHDL 2.4  VLDL 9  LDLCALC 43   HgbA1c:  Recent Labs  Lab 07/19/20 0330  HGBA1C 6.1*   Urine Drug Screen: No results for input(s): LABOPIA, COCAINSCRNUR, LABBENZ, AMPHETMU, THCU, LABBARB in the last 168 hours.  Alcohol Level No results for input(s): ETH in the last 168 hours.  IMAGING past 24 hours  MR FOOT LEFT WO CONTRAST  Result Date: 07/20/2020 CLINICAL DATA:  Osteomyelitis, great toe draining wound. EXAM: MRI OF THE LEFT FOOT WITHOUT CONTRAST TECHNIQUE: Multiplanar, multisequence MR imaging of the left forefoot was performed. No intravenous contrast was administered. COMPARISON:  07/19/2020 radiographs FINDINGS: Bones/Joint/Cartilage Abnormal marrow edema in the distal and proximal phalanges of the great toe centered at the interphalangeal joint. The patient has a known new medial erosion of the head of the proximal phalanx of the great toe. There is  no joint effusion. No other significant abnormal osseous edema along the forefoot. Ligaments The Lisfranc ligament appears intact. Muscles and Tendons Low-grade edema signal along the plantar musculature of the foot, probably neurogenic. Muscular atrophy. Soft tissues Considerable subcutaneous edema in the forefoot, especially dorsally, extending into the toes. No visible drainable abscess. IMPRESSION: 1. Abnormal marrow edema in the distal and proximal phalanges of the great toe centered at the interphalangeal joint. The patient has a known new medial erosion of the head of the proximal phalanx of the great toe. Gout or osteomyelitis could cause this imaging appearance; presence of the wound along the great toe tends to favor infection, and if there is drainage then the likelihood of infection increases further. 2. Subcutaneous edema in the forefoot, especially dorsally, extending into the toes. No visible drainable abscess. 3. Low-grade edema signal along the plantar musculature of the foot, probably neurogenic. Electronically Signed   By: Van Clines M.D.   On: 07/20/2020 13:13   DG Toe Great Left  Result Date: 07/19/2020 CLINICAL DATA:  Cellulitis of the foot. Left great toe wound for 8 weeks. EXAM: LEFT GREAT TOE COMPARISON:  05/15/2020 FINDINGS: No acute fracture or dislocation. Generalized osteopenia. Erosion of the distal medial corner of the first proximal phalanx new compared with the prior examination of 05/15/2020  concerning for osteomyelitis versus a crystalline arthropathy such as gout. Soft tissue are unremarkable. No radiopaque foreign body or soft tissue emphysema. Peripheral vascular atherosclerotic disease. IMPRESSION: 1. No acute osseous injury of the left great toe. 2. Erosion of the distal medial corner of the first proximal phalanx new compared with the prior examination of 05/15/2020 concerning for osteomyelitis versus a crystalline arthropathy such as gout. Electronically Signed    By: Kathreen Devoid   On: 07/19/2020 15:30   VAS [place] ABI WITH/WO TBI  Result Date: 07/19/2020 LOWER EXTREMITY DOPPLER STUDY Indications: Ulceration, and peripheral artery disease. High Risk         Hypertension, hyperlipidemia, Diabetes, past history of Factors:          smoking.  Vascular Interventions: Prev RLE BKA. Limitations: Today's exam was limited due to an open wound and bandages. Comparison Study: Prev 09/2018 Non-compressible Performing Technologist: Vonzell Schlatter RVT  Examination Guidelines: A complete evaluation includes at minimum, Doppler waveform signals and systolic blood pressure reading at the level of bilateral brachial, anterior tibial, and posterior tibial arteries, when vessel segments are accessible. Bilateral testing is considered an integral part of a complete examination. Photoelectric Plethysmograph (PPG) waveforms and toe systolic pressure readings are included as required and additional duplex testing as needed. Limited examinations for reoccurring indications may be performed as noted.  ABI Findings: +--------+------------------+-----+---------+-------+ Left    Lt Pressure (mmHg)IndexWaveform Comment +--------+------------------+-----+---------+-------+ [id number]                    triphasic        +--------+------------------+-----+---------+-------+ PTA     254               1.54 biphasic         +--------+------------------+-----+---------+-------+ DP      254               1.54 biphasic         +--------+------------------+-----+---------+-------+ +-------+-----------+-----------+------------+------------+ ABI/TBIToday's ABIToday's TBIPrevious ABIPrevious TBI +-------+-----------+-----------+------------+------------+ Right  1.5 [place]                Non-comp                 +-------+-----------+-----------+------------+------------+ Left   1.5 [place]                Non-comp                 +-------+-----------+-----------+------------+------------+  Arterial wall calcification precludes accurate ankle pressures and ABIs.  Summary: Left: Resting left ankle-brachial index indicates noncompressible left lower extremity arteries. ABIs are unreliable. Open wound left great toe.  *See table(s) above for measurements and observations.    Preliminary    PHYSICAL EXAM  Temp:  [98 F (36.7 C)-98.4 F (36.9 C)] 98.4 F (36.9 C) (02/07 1205) Pulse Rate:  [85-94] 94 (02/07 1205) Resp:  [16-20] 18 (02/07 1205) BP: (119-160)/(50-76) 135/67 (02/07 1205) SpO2:  [95 %-100 %] 98 % (02/07 1205)  General - Well developed obese male sitting up in bed watching tv in no apparent distress  Mental Status -  Alert, oriented x4, calm, cooperative and conversant. Language including expression, naming, repetition, comprehension was assessed and found intact. Attention span and concentration were normal. Cranial Nerves II - XII - CN II Pupils equal and reactive to light, no VF deficits    CN III,IV,VI EOM intact, no gaze preference or deviation, no nystagmus    CN V Normal sensation in V1, V2, and V3 segments bilaterally  to light touch   CN VII No asymmetry, no nasolabial fold flattening   CN VIII Hard of hearing    CN IX & X Normal palatal elevation, no uvular deviation   CN XI 5/5 head turn and 5/5 shoulder shrug bilaterally   CN XII midline tongue protrusion   Motor Strength -  Bilat UE 5/5 throughout  LLE 5/5 strength S/p RBKA   Motor Tone -  Sensory - Light touch bilat UE and to thigh bilaterally were symmetric.   Coordination - Ataxia on finger to nose persists R>L. RAMs slowed R>L.    Gait and Station - deferred.  ASSESSMENT/PLAN Mr. CHIRAG KRUEGER is a 85 y.o. male with history of diabetes mellitus with peripheral vascular disease status post Right BKA, diastolic CHF presenting with generalized weakness, chills, fever, and acute on chronic kidney disease.     Stroke: incidental right frontal lobe subacute infarct secondary to small vessel  disease source  CT head right > left subacute cerebellar infarcts.   MRI subacute punctate infarct of right frontal lobe, vs. T2 shine through per my read. Old bilateral cerebellar and pontine infarct.  MRA right vertebral artery not seen possible occlusion or high-grade stenosis.   Carotid Doppler no significant stenosis  2D Echo EF 55-60%, no SOE  LDL 43  HgbA1c 6.1  VTE prophylaxis - Lovenox $RemoveB'40mg'xKwGsZHo$  daily  aspirin 81 mg daily prior to admission, now on aspirin 81 mg daily and clopidogrel 75 mg daily. DAPT for 21 days then Aspirin alone  Therapy recommendations: home health OT and PT  Disposition: pending  Sepsis / osteomyelitis ?  Fever on admission 100.9 - chills shivering at home  WBC 11.8  Likely due to diabetic left great toe infection  On ceftriaxone, flagyl  Podiatry following  Hypertension  Stable, diastolic BP on the lower side  Avoid low BP . Long-term BP goal normotensive  Hyperlipidemia  Home meds:  crestor $Remove'10mg'RsIosrL$  M W F, resumed in hospital  LDL 43, goal < 70  High intensity statin not indicated as patient is at goal  Continue statin at discharge  Diabetes type II Controlled  HgbA1c 6.1, goal < 7.0  CBGs  SSI  PCP follow up closely  AKI on CKD stage 4  Cre 2.65, baseline 1.73  On IVF  BMP monitoring  Management per primary team  Other Stroke Risk Factors  Advanced Age >/= 25   Cigarette smoker: former smoker   Obesity, Body mass index is 36.28 kg/m., BMI >/= 30 associated with increased stroke risk, recommend weight loss, diet and exercise as appropriate   Remote Hx of stroke (bilateral cerebellar and pontine infarct)  Obstructive sleep apnea, on CPAP at home  Diastolic congestive heart failure  PVD - s/p BKA  Other Active Problems  Gout  Thrombocytopenia platelet 123  RBBB  Hospital day # 2  Neurology will sign off. Please call with questions. Pt will follow up with stroke clinic NP at [facility] in about 4 weeks.  Thanks for the consult.  Rosalin Hawking, MD PhD Stroke Neurology 07/20/2020 4:34 PM    To contact Stroke Continuity provider, please refer to [email or web address] After hours, contact General Neurology

## 2020-07-20 NOTE — Consult Note (Addendum)
Podiatry consult  Consult to: Dr. Dwyane Dee  Consult from: Dr. Cannon Kettle (podiatry, concern for left 1st toe osteomyelitis history of open wound)  HPI: Micheal Walls is a 85 y.o. male patient seen at bedside, resting comfortably in no acute distress.  Patient reports that he has had this wound for several weeks and has help from his wife and nurses once weekly to change the dressing.  Patient reports wound on the left first toe appears to be healing well.  Denies pain to the toe, denies nausea, vomiting, fever, chills.  No other issues noted.   Patient Active Problem List   Diagnosis Date Noted  . Cerebral thrombosis with cerebral infarction 07/20/2020  . Sepsis ([place]) 07/19/2020  . Thrombocytopenia ([place]) 07/19/2020  . Cerebral ischemia 07/18/2020  . Bilateral hearing loss 05/20/2020  . Does use hearing aid 05/20/2020  . Diabetic ulcer of left great toe ([place]) 01/16/2020  . Controlled type 2 diabetes mellitus with stable proliferative retinopathy of both eyes, with long-term current use of insulin ([place]) 01/06/2020  . Intermediate stage nonexudative age-related macular degeneration of both eyes 01/06/2020  . Left epiretinal membrane 01/06/2020  . [place] of right macula 01/06/2020  . AKI (acute kidney injury) ([place]) 11/02/2019  . Cellulitis 11/02/2019  . Elevated TSH 07/10/2018  . Nephropathy 02/28/2018  . Disturbance of skin sensation 02/28/2018  . CKD (chronic kidney disease) stage 4, GFR 15-29 ml/min (HCC) 07/17/2017  . PAD (peripheral artery disease) ([place]) 07/17/2017  . 110.1 10/21/2013  . Epiphora 04/24/2013  . Laxity of eyelid 04/24/2013  . Anemia 04/09/2013  . History of peripheral vascular disease 04/09/2013  . History of stroke 04/09/2013  . S/P unilateral BKA (below knee amputation) ([place]) 03/17/2013  . Diabetes mellitus type 2 in obese ([place]) 03/17/2013  . Mixed hyperlipidemia 03/17/2013  . Essential hypertension 03/17/2013  . Gout 03/17/2013  . CKD (chronic kidney disease),  stage III ([place]) 03/17/2013  . OSA on CPAP 03/17/2013  . Anemia, iron deficiency 03/17/2013  . Diverticulosis of colon 03/17/2013  . RBBB 03/17/2013  . Punctal stenosis, acquired 10/02/2012   Patient admits to a history of a right lower leg amputation in 2000 secondary to infection  Admits to a history of stroke currently on anticoagulant therapy  Admits to a history of diabetes sugars at home up and down per patient lacking control.  However A1c is 6.1 as of yesterday.  Admits to a history of gout with last flare unknown   Current Facility-Administered Medications:  .   stroke: mapping our early stages of recovery book, , Does not apply, Once, Clarnce Flock, MD .  0.9 %  sodium chloride infusion, , Intravenous, Continuous, Clarnce Flock, MD, Last Rate: 50 mL/hr at 07/20/20 0543, Infusion Verify at 07/20/20 0543 .  acetaminophen (TYLENOL) tablet 650 mg, 650 mg, Oral, Q4H PRN **OR** acetaminophen (TYLENOL) 160 MG/5ML solution 650 mg, 650 mg, Per Tube, Q4H PRN **OR** acetaminophen (TYLENOL) suppository 650 mg, 650 mg, Rectal, Q4H PRN, Clarnce Flock, MD .  aspirin EC tablet 81 mg, 81 mg, Oral, Daily, Clarnce Flock, MD, 81 mg at 07/20/20 0854 .  cefTRIAXone (ROCEPHIN) 2 g in sodium chloride 0.9 % 100 mL IVPB, 2 g, Intravenous, Q24H, Samuella Cota, MD, Stopped at 07/19/20 2228 .  clopidogrel (PLAVIX) tablet 75 mg, 75 mg, Oral, Daily, Samuella Cota, MD, 75 mg at 07/20/20 0859 .  diltiazem (CARDIZEM CD) 24 hr capsule 300 mg, 300 mg, Oral, Daily, Dione Plover, Annice Needy, MD,  300 mg at 07/20/20 0855 .  dorzolamide (TRUSOPT) 2 % ophthalmic solution 1 drop, 1 drop, Both Eyes, BID, Clarnce Flock, MD, 1 drop at 07/20/20 0857 .  enoxaparin (LOVENOX) injection 40 mg, 40 mg, Subcutaneous, Daily, Samuella Cota, MD, 40 mg at 07/20/20 0857 .  insulin aspart (novoLOG) injection 0-6 Units, 0-6 Units, Subcutaneous, TID WC, Clarnce Flock, MD, 1 Units at 07/19/20 1625 .   latanoprost (XALATAN) 0.005 % ophthalmic solution 1 drop, 1 drop, Both Eyes, QHS, Clarnce Flock, MD, 1 drop at 07/19/20 2313 .  metroNIDAZOLE (FLAGYL) tablet 500 mg, 500 mg, Oral, Q8H, Pham, Minh Q, RPH-CPP .  multivitamin with minerals tablet 1 tablet, 1 tablet, Oral, Daily, Clarnce Flock, MD, 1 tablet at 07/20/20 [phone] .  omega-3 acid ethyl esters (LOVAZA) capsule 2 g, 2 g, Oral, Daily, Clarnce Flock, MD, 2 g at 07/20/20 0854 .  pantoprazole (PROTONIX) EC tablet 40 mg, 40 mg, Oral, QHS, Clarnce Flock, MD, 40 mg at 07/19/20 2156 .  rosuvastatin (CRESTOR) tablet 10 mg, 10 mg, Oral, Q M,W,F, Clarnce Flock, MD, 10 mg at 07/20/20 2947  Allergies  Allergen Reactions  . Vancomycin Rash     Objective: Today's Vitals   07/19/20 2313 07/20/20 0321 07/20/20 0721 07/20/20 0855  BP: (!) 119/54 (!) 119/52 (!) 125/50 (!) 125/55  Pulse: 90 90 87   Resp: $Remo'17 17 16   'DzvAb$ Temp: 98.3 F (36.8 C) 98.4 F (36.9 C) 98.1 F (36.7 C)   TempSrc: Oral Oral Oral   SpO2: 100% 98% 97%   Weight:      Height:      PainSc:        General: No acute distress  Left Lower extremity: Dressing to left great toe clean dry and intact.  Upon removal there is a pinpoint fibrogranular ulceration noted at the dorsal hallux medial IPJ that measures less than 0.2 cm probes to fatty tissue with no active drainage there is also a dry blood blister noted to the distal left hallux tuft with no significant erythema, no active drainage, no redness, or warmth.  DP pedal pulse on left 1 out of 4, PT pedal pulses on left 0 out of 4, 1+ pitting edema difficult to palpate pulse.  No pain to palpation to left foot or calf.  Pes planus foot type.    Status post right lower extremity amputation.    Xray, Left foot: IMPRESSION: 1. No acute osseous injury of the left great toe. 2. Erosion of the distal medial corner of the first proximal phalanx new compared with the prior examination of 05/15/2020 concerning  for osteomyelitis versus a crystalline arthropathy such as gout.   ABIs Summary:  Left: Resting left ankle-brachial index indicates noncompressible left  lower extremity arteries. ABIs are unreliable.   Open wound left great toe.    Assessment and Plan:  Problem List Items Addressed This Visit      Genitourinary   AKI (acute kidney injury) ([place]) - Primary     Other   * (Principal) Sepsis ([place])    Other Visit Diagnoses    Vertebral artery stenosis, unspecified laterality       Relevant Medications   aspirin EC 81 MG tablet   aspirin chewable tablet 81 mg (Completed)   aspirin EC tablet 81 mg   diltiazem (CARDIZEM CD) 24 hr capsule 300 mg   rosuvastatin (CRESTOR) tablet 10 mg   omega-3 acid ethyl esters (LOVAZA) capsule 2 g  enoxaparin (LOVENOX) injection 40 mg   Cerebrovascular accident (CVA), unspecified mechanism (HCC)       Relevant Medications   aspirin EC 81 MG tablet   aspirin chewable tablet 81 mg (Completed)   aspirin EC tablet 81 mg   diltiazem (CARDIZEM CD) 24 hr capsule 300 mg   rosuvastatin (CRESTOR) tablet 10 mg   omega-3 acid ethyl esters (LOVAZA) capsule 2 g   enoxaparin (LOVENOX) injection 40 mg   Wound cellulitis       Relevant Orders   DG Toe Great Left (Completed)      -Patient seen and evaluated at bedside -Wound assessment performed -Chart review performed -Recommend at this time for patient to continue to proceed to get MRI of left foot to help with definitive diagnosis of osteomyelitis versus gout -May continue with broad-spectrum antibiotics of Rocephin -May continue with treatment of gout with use of colchicine -Pending results of MRI we will discuss treatment/possible intervention however at this time patient may not be a good candidate for surgery due to PVD and stroke history -Recommend to continue with vascular follow-up -Continue with local wound care of Betadine and dry dressing to first toe on left -Recommend continue with PT OT  and weightbearing as tolerated with a surgical shoe that does not rub great toe on the left -Consult appreciated -Podiatry to follow  Landis Martins, DPM [facility] [phone] office [phone] cell  Estimated time with patient and encounter: 33mins

## 2020-07-20 NOTE — Telephone Encounter (Signed)
Hospital consultation has been requested for the patient. Patient has open wound-left foot great toe possible osteomyelitis. Patient is currently receiving care for stroke, located at [facility] [id number].  Patients Contact [phone]  Dr. Dwyane Dee [phone]

## 2020-07-20 NOTE — Consult Note (Signed)
WOC Nurse Consult Note: Patient receiving care in [facility] [phone]. Reason for Consult: LE wound Wound type: Patient has been followed at the [facility] by Dr. Dellia Nims.  Per doctor Robson's note from end of last year, "punched-out wound over the interphalangeal joint of his left great toe dorsally. He has severe PAD which is not listed is being amenable to revascularization or angiography because of his stage IV chronic renal failure. It has been being treated with collagen, then hydrofera blue most recently. [facility] does not have hydrofera blue in its formulary. Pressure Injury POA: Yes/No/NA Measurement: 0.4 cm x 0.3 cm x no measureable depth Wound bed: pink Drainage (amount, consistency, odor) none Periwound: intact.  However, there is an intact area on the plantar surface of the tip of the left great toe that is likely a trauma injury from a shoe. Dressing procedure/placement/frequency: BID application of iodine from the swabsticks or swab pads from clean utility to both areas; allow to air dry. Cover with a bandage.  Monitor the wound area(s) for worsening of condition such as: Signs/symptoms of infection,  Increase in size,  Development of or worsening of odor, Development of pain, or increased pain at the affected locations.  Notify the medical team if any of these develop.  Thank you for the consult.  Discussed plan of care with the patient.  [place] nurse will not follow at this time.  Please re-consult the [facility] team if needed.  Val Riles, RN, MSN, CWOCN, CNS-BC, pager [phone]

## 2020-07-20 NOTE — Progress Notes (Signed)
PROGRESS NOTE    Micheal Walls  GUR:[id number] DOB: 1935-09-04 DOA: 07/18/2020 PCP: [name], [name]   Brief Narrative:  This 85 year old man PMH diabetes mellitus with peripheral vascular disease status post Right BKA, diastolic CHF presented to emergency department after waking up feeling weak.  Admitted for SIRS, suspected cerebral infarct, right vertebral artery stenosis, acute kidney injury.  Patient is transferred from [facility] for a stroke work-up.  Neurology recommended aspirin and Plavix for 3 weeks followed by aspirin alone.  Patient is found to have a wound on left great toe,  MRI is ordered and Podiatry was consulted.  Assessment & Plan:   Principal Problem:   Sepsis ([place]) Active Problems:   S/P unilateral BKA (below knee amputation) (HCC)   Essential hypertension   Gout   CKD (chronic kidney disease), stage III (HCC)   RBBB   CKD (chronic kidney disease) stage 4, GFR 15-29 ml/min (HCC)   PAD (peripheral artery disease) (HCC)   AKI (acute kidney injury) ([place])   Controlled type 2 diabetes mellitus with stable proliferative retinopathy of both eyes, with long-term current use of insulin (HCC)   Diabetic ulcer of left great toe (HCC)   Cerebral ischemia   Thrombocytopenia (HCC)   Cerebral thrombosis with cerebral infarction  Subacute right frontal stroke, severe stenosis versus occlusion right distal intradural vertebral artery.   Patient presented with generalized weakness, is found to have subacute right frontal stroke on MRI. Thought secondary to poorly controlled diabetes, hypertension Neurology recommended aspirin and Plavix for 3 weeks then aspirin 81 mg daily alone, continue statin, follow-up TTE, DVT prophylaxis  --Per nursing cleared swallow screen.  Resumed diet..  Sepsis secondary to diabetic left foot infection: --Ulcer on distal tip of left toe appears to be infected.   Foot otherwise appears uninvolved.  Perfusion appears intact.   Lower extremity  wound protocol initiated.  Check ABI. -Narrow antibiotics.  Wound care consult. - MRI of left foot ordered, report pending. -Podiatry consulted, poor surgical candidate given recent stroke and peripheral artery disease.  AKI superimposed on CKD, likely stage IIIb >> > Improving --Gentle IV fluids.  Hold diuretic.  Repeat BMP in a.m.  Thrombocytopenia, seen also May 2021. --Etiology unclear.  Could be connected to sepsis or may be chronic. --Trend CBC.  Diabetes mellitus type 2 with peripheral vascular disease, status post right BKA, peripheral neuropathy --Hold gabapentin in setting of acute kidney injury.  Hold semaglutide. --Sliding scale insulin.  Gout --Allopurinol on hold in the setting of AKI. - resume colchicine  Essential hypertension.  BP meds on hold, permissive hypertension.  Glaucoma --Continue eye gtts   DVT prophylaxis:  Lovenox Code Status: Full code Family Communication: No family at bed side. Disposition Plan:   Status is: Inpatient  Remains inpatient appropriate because:Inpatient level of care appropriate due to severity of illness   Dispo: The patient is from: Home              Anticipated d/c is to: TBD              Anticipated d/c date is: 3 days              Patient currently is not medically stable to d/c.   Difficult to place patient No   Consultants:   Neurology  Podiatry  Procedures:  Antimicrobials:  Anti-infectives (From admission, onward)   Start     Dose/Rate Route Frequency Ordered Stop   07/20/20 1015  metroNIDAZOLE (FLAGYL) tablet 500  mg        500 mg Oral Every 8 hours 07/20/20 0921     07/19/20 2200  cefTRIAXone (ROCEPHIN) 2 g in sodium chloride 0.9 % 100 mL IVPB        2 g 200 mL/hr over 30 Minutes Intravenous Every 24 hours 07/19/20 1441     07/19/20 1400  ceFEPIme (MAXIPIME) 1 g in sodium chloride 0.9 % 100 mL IVPB  Status:  Discontinued        1 g 200 mL/hr over 30 Minutes Intravenous Every 24 hours 07/18/20 2353  07/18/20 2356   07/19/20 1400  ceFEPIme (MAXIPIME) 2 g in sodium chloride 0.9 % 100 mL IVPB  Status:  Discontinued        2 g 200 mL/hr over 30 Minutes Intravenous Every 24 hours 07/18/20 2356 07/19/20 1441   07/19/20 0500  linezolid (ZYVOX) IVPB 600 mg  Status:  Discontinued        600 mg 300 mL/hr over 60 Minutes Intravenous Every 12 hours 07/18/20 2318 07/19/20 1441   07/19/20 0000  metroNIDAZOLE (FLAGYL) IVPB 500 mg  Status:  Discontinued        500 mg 100 mL/hr over 60 Minutes Intravenous Every 8 hours 07/18/20 2318 07/20/20 0921   07/18/20 1545  linezolid (ZYVOX) IVPB 600 mg        600 mg 300 mL/hr over 60 Minutes Intravenous  Once 07/18/20 1535 07/18/20 1934   07/18/20 1400  vancomycin (VANCOREADY) IVPB 2000 mg/400 mL  Status:  Discontinued        2,000 mg 200 mL/hr over 120 Minutes Intravenous  Once 07/18/20 1343 07/18/20 1423   07/18/20 1330  ceFEPIme (MAXIPIME) 2 g in sodium chloride 0.9 % 100 mL IVPB        2 g 200 mL/hr over 30 Minutes Intravenous  Once 07/18/20 1325 07/18/20 1445   07/18/20 1330  metroNIDAZOLE (FLAGYL) IVPB 500 mg        500 mg 100 mL/hr over 60 Minutes Intravenous  Once 07/18/20 1325 07/18/20 1522   07/18/20 1330  vancomycin (VANCOCIN) IVPB 1000 mg/200 mL premix  Status:  Discontinued        1,000 mg 200 mL/hr over 60 Minutes Intravenous  Once 07/18/20 1325 07/18/20 1340      Subjective: Patient was seen and examined at bedside.  Overnight events noted.   Patient was alert and oriented x 3,  Patient is s/p right BKA.  Denies any concerns.  Objective: Vitals:   07/20/20 0321 07/20/20 0721 07/20/20 0855 07/20/20 1205  BP: (!) 119/52 (!) 125/50 (!) 125/55 135/67  Pulse: 90 87  94  Resp: $Remo'17 16  18  'AJjax$ Temp: 98.4 F (36.9 C) 98.1 F (36.7 C)  98.4 F (36.9 C)  TempSrc: Oral Oral  Oral  SpO2: 98% 97%  98%  Weight:      Height:        Intake/Output Summary (Last 24 hours) at 07/20/2020 1323 Last data filed at 07/20/2020 7124 Gross per 24 hour   Intake 740 ml  Output 500 ml  Net 240 ml   Filed Weights   07/18/20 1417  Weight: 124.7 kg    Examination:  General exam: Appears calm and comfortable, not in any acute distress. Respiratory system: Clear to auscultation. Respiratory effort normal. Cardiovascular system: S1 & S2 heard, RRR. No JVD, murmurs, rubs, gallops or clicks. No pedal edema. Gastrointestinal system: Abdomen is nondistended, soft and nontender. No organomegaly or masses felt. Normal bowel  sounds heard. Central nervous system: Alert and oriented. No focal neurological deficits. Extremities: Symmetric 5 x 5 power. Right BKA, left great toe wound  Skin: No rashes, lesions or ulcers Psychiatry: Judgement and insight appear normal. Mood & affect appropriate.     Data Reviewed: I have personally reviewed following labs and imaging studies  CBC: Recent Labs  Lab 07/18/20 1413  WBC 11.8*  NEUTROABS 10.3*  HGB 11.4*  HCT 34.5*  MCV 95.0  PLT 123*   Basic Metabolic Panel: Recent Labs  Lab 07/18/20 1413 07/20/20 0950  NA 139 141  K 4.0 3.8  CL 103 109  CO2 24 21*  GLUCOSE 187* 163*  BUN 66* 49*  CREATININE 2.65* 2.04*  CALCIUM 10.2 9.9   GFR: Estimated Creatinine Clearance: 38.6 mL/min (A) (by C-G formula based on SCr of 2.04 mg/dL (H)). Liver Function Tests: Recent Labs  Lab 07/18/20 1413  AST 27  ALT 20  ALKPHOS 52  BILITOT 1.1  PROT 7.0  ALBUMIN 3.7   No results for input(s): LIPASE, AMYLASE in the last 168 hours. No results for input(s): AMMONIA in the last 168 hours. Coagulation Profile: Recent Labs  Lab 07/18/20 1413  INR 1.2   Cardiac Enzymes: No results for input(s): CKTOTAL, CKMB, CKMBINDEX, TROPONINI in the last 168 hours. BNP (last 3 results) No results for input(s): PROBNP in the last 8760 hours. HbA1C: Recent Labs    07/19/20 0330  HGBA1C 6.1*   CBG: Recent Labs  Lab 07/19/20 1212 07/19/20 1622 07/19/20 2105 07/20/20 0609 07/20/20 1200  GLUCAP 125* 153*  127* 108* 126*   Lipid Profile: Recent Labs    07/19/20 0330  CHOL 90  HDL 38*  LDLCALC 43  TRIG 44  CHOLHDL 2.4   Thyroid Function Tests: No results for input(s): TSH, T4TOTAL, FREET4, T3FREE, THYROIDAB in the last 72 hours. Anemia Panel: No results for input(s): VITAMINB12, FOLATE, FERRITIN, TIBC, IRON, RETICCTPCT in the last 72 hours. Sepsis Labs: Recent Labs  Lab 07/18/20 1413 07/18/20 1525  LATICACIDVEN 2.4* 1.2    Recent Results (from the past 240 hour(s))  Blood Culture (routine x 2)     Status: None (Preliminary result)   Collection Time: 07/18/20  2:12 PM   Specimen: BLOOD  Result Value Ref Range Status   Specimen Description   Final    BLOOD RIGHT ANTECUBITAL Performed at [facility], [address] [place], [place] [address]    Special Requests   Final    BOTTLES DRAWN AEROBIC AND ANAEROBIC Blood Culture results may not be optimal due to an excessive volume of blood received in culture bottles Performed at [facility], [address], [place], [place] [address]    Culture   Final    NO GROWTH 2 DAYS Performed at [facility], [address], [place], [place] [address]    Report Status PENDING  Incomplete  Blood Culture (routine x 2)     Status: None (Preliminary result)   Collection Time: 07/18/20  2:13 PM   Specimen: BLOOD  Result Value Ref Range Status   Specimen Description   Final    BLOOD LEFT ANTECUBITAL Performed at [facility], [address], [place], [place] [address]    Special Requests   Final    BOTTLES DRAWN AEROBIC AND ANAEROBIC Blood Culture adequate volume Performed at [facility], [address], [place], [place] [address]    Culture   Final  NO GROWTH 2 DAYS Performed at [facility], [address] [place], [place] [address]    Report Status PENDING  Incomplete  SARS Coronavirus 2 by RT PCR (hospital order, performed in [facility]  hospital lab) Nasopharyngeal Nasopharyngeal Swab     Status: None   Collection Time: 07/18/20  2:20 PM   Specimen: Nasopharyngeal Swab  Result Value Ref Range Status   SARS Coronavirus 2 NEGATIVE NEGATIVE Final    Comment: (NOTE) SARS-CoV-2 target nucleic acids are NOT DETECTED.  The SARS-CoV-2 RNA is generally detectable in upper and lower respiratory specimens during the acute phase of infection. The lowest concentration of SARS-CoV-2 viral copies this assay can detect is 250 copies / mL. A negative result does not preclude SARS-CoV-2 infection and should not be used as the sole basis for treatment or other patient management decisions.  A negative result may occur with improper specimen collection / handling, submission of specimen other than nasopharyngeal swab, presence of viral mutation(s) within the areas targeted by this assay, and inadequate number of viral copies (<250 copies / mL). A negative result must be combined with clinical observations, patient history, and epidemiological information.  Fact Sheet for Patients:   BoilerBrush.com.cy  Fact Sheet for Healthcare Providers: https://pope.com/  This test is not yet approved or  cleared by the [place] FDA and has been authorized for detection and/or diagnosis of SARS-CoV-2 by FDA under an Emergency Use Authorization (EUA).  This EUA will remain in effect (meaning this test can be used) for the duration of the COVID-19 declaration under Section 564(b)(1) of the Act, 21 U.S.C. section 360bbb-3(b)(1), unless the authorization is terminated or revoked sooner.  Performed at [facility], [address], [place], [place] [address]   Urine culture     Status: Abnormal   Collection Time: 07/18/20  7:06 PM   Specimen: In/Out Cath Urine  Result Value Ref Range Status   Specimen Description   Final    IN/OUT CATH URINE Performed at [facility], [address] [place], [place] [address]    Special Requests   Final    NONE Performed at [facility], [address] [place], [place] [address]    Culture MULTIPLE SPECIES PRESENT, SUGGEST RECOLLECTION (A)  Final   Report Status 07/20/2020 FINAL  Final    Radiology Studies: CT Head Wo Contrast  Result Date: 07/18/2020 CLINICAL DATA:  Mental status change. EXAM: CT HEAD WITHOUT CONTRAST TECHNIQUE: Contiguous axial images were obtained from the base of the skull through the vertex without intravenous contrast. COMPARISON:  None. FINDINGS: Brain: Large area of hypoattenuation in the right cerebellar cortex. More subtle area of hypoattenuation in the superior left cerebellum. No evidence of mass effect or midline shift. No evidence of acute hemorrhage. Vascular: Heavy calcific atherosclerotic disease of the intra cavernous carotid arteries. Skull: Normal. Negative for fracture or focal lesion. Sinuses/Orbits: Dense consolidation in the left maxillary sinus with high density material, query fungal sinusitis. The remainder of the paranasal sinuses and mastoid air cells are normal. Other: None. IMPRESSION: 1. Large area of hypoattenuation in the right cerebellar cortex. More subtle area of hypoattenuation in the superior left cerebellum. These findings may represent areas of acute/subacute infarction or other destructive brain process. 2. No evidence of acute hemorrhage. 3. Dense consolidation in the left maxillary sinus with high density material, query fungal sinusitis. 4. Evaluation with brain MRI is recommended. Electronically Signed   By: Sharlyne Pacas  Dimitrova M.D.   On: 07/18/2020 14:52   MR ANGIO HEAD WO CONTRAST  Addendum Date: 07/18/2020   ADDENDUM REPORT: 07/18/2020 19:37 ADDENDUM: On further review, while limited by motion there appears to be absent flow related signal within the right intradural vertebral artery except for distally, concerning for age-indeterminant  occlusion or high-grade stenosis of the proximal right vertebral artery. CTA could further characterize if clinically indicated. Findings in the report and addendum were discussed with Percell Miller, [place] via telephone at 7:32 PM via telephone. Electronically Signed   By: Margaretha Sheffield MD   On: 07/18/2020 19:37   Result Date: 07/18/2020 CLINICAL DATA:  Neuro deficit, acute stroke suspected. EXAM: MRI HEAD WITHOUT CONTRAST MRA HEAD WITHOUT CONTRAST TECHNIQUE: Multiplanar, multiecho pulse sequences of the brain and surrounding structures were obtained without intravenous contrast. Angiographic images of the Circle of Willis were obtained using MRA technique without intravenous contrast. CONTRAST:  67mL GADAVIST GADOBUTROL 1 MMOL/ML IV SOLN COMPARISON:  Same day head CT. FINDINGS: MRI HEAD FINDINGS Brain: Remote large bilateral cerebellar infarcts with encephalomalacia. Remote right paramidline pontine infarct. Small area of DWI hyperintensity without definite ADC correlate in the right frontal lobe. Mild associated edema without mass effect. Additional T2/FLAIR hyperintensities within the white matter, mild for age in likely related to chronic microvascular ischemic disease. No acute hemorrhage. No hydrocephalus. No mass lesion or abnormal mass effect. No extra-axial fluid collection. Moderate cerebral atrophy. Vascular: Poor flow void within the imaged intradural right vertebral artery Skull and upper cervical spine: Diffusely T1 hypointense marrow signal in the upper cervical spine. Sinuses/Orbits: Left maxillary sinus mucosal thickening and mild mucosal thickening of scattered ethmoid air cells. No air-fluid levels. Unremarkable orbits. Other: No mastoid effusions. MRA HEAD FINDINGS Significantly limited MRA due to patient motion. Within this limitation: Anterior circulation: Bilateral internal carotid arteries are patent through the carotid siphons. Bilateral M1 and proximal M2 MCA branches are patent without evidence  of hemodynamically significant proximal stenosis. Bilateral ACAs are patent. No visible aneurysm. Posterior circulation: The intradural vertebral arteries are poorly visualized due to motion; however, there is suspected severe stenosis or occlusion of the right distal intradural vertebral artery and possible left intradural vertebral artery stenosis. Diminutive vertebrobasilar system with bilateral fetal type PCAs. There is suspected mild stenosis of the basilar artery. Bilateral PCAs are patent without evidence of hemodynamically significant proximal stenosis. No visible aneurysm. IMPRESSION: MRI head: 1. Small area of DWI hyperintensity without definite ADC correlate in the right frontal lobe, either T2 shine through/artifact or subacute infarct. 2. Remote large bilateral cerebellar infarcts and a remote pontine infarct. 3. Diffusely T1 hypointense marrow signal in the upper cervical spine, which is nonspecific and could relate to chronic anemia, chronic hypoxia (such as in smokers), or a lymphoproliferative disorder. MRA: 1. Significantly limited MRA secondary to patient motion. 2. The intradural vertebral arteries are poorly visualized; however, there is suspected severe stenosis or occlusion of the right distal intradural vertebral artery and possible left vertebral artery stenosis. A neck CTA could further characterize if clinically indicated. 3. Diminutive vertebrobasilar system with bilateral fetal type PCAs and likely mild narrowing of the basilar artery. 4. No large vessel occlusion or evidence of hemodynamically significant proximal stenosis in the anterior circulation Electronically Signed: By: Margaretha Sheffield MD On: 07/18/2020 19:25   MR Brain W and Lottie Dawson Contrast  Addendum Date: 07/18/2020   ADDENDUM REPORT: 07/18/2020 19:37 ADDENDUM: On further review, while limited by motion there appears to be absent flow related signal within the  right intradural vertebral artery except for distally, concerning  for age-indeterminant occlusion or high-grade stenosis of the proximal right vertebral artery. CTA could further characterize if clinically indicated. Findings in the report and addendum were discussed with Percell Miller, [place] via telephone at 7:32 PM via telephone. Electronically Signed   By: Margaretha Sheffield MD   On: 07/18/2020 19:37   Result Date: 07/18/2020 CLINICAL DATA:  Neuro deficit, acute stroke suspected. EXAM: MRI HEAD WITHOUT CONTRAST MRA HEAD WITHOUT CONTRAST TECHNIQUE: Multiplanar, multiecho pulse sequences of the brain and surrounding structures were obtained without intravenous contrast. Angiographic images of the Circle of Willis were obtained using MRA technique without intravenous contrast. CONTRAST:  63mL GADAVIST GADOBUTROL 1 MMOL/ML IV SOLN COMPARISON:  Same day head CT. FINDINGS: MRI HEAD FINDINGS Brain: Remote large bilateral cerebellar infarcts with encephalomalacia. Remote right paramidline pontine infarct. Small area of DWI hyperintensity without definite ADC correlate in the right frontal lobe. Mild associated edema without mass effect. Additional T2/FLAIR hyperintensities within the white matter, mild for age in likely related to chronic microvascular ischemic disease. No acute hemorrhage. No hydrocephalus. No mass lesion or abnormal mass effect. No extra-axial fluid collection. Moderate cerebral atrophy. Vascular: Poor flow void within the imaged intradural right vertebral artery Skull and upper cervical spine: Diffusely T1 hypointense marrow signal in the upper cervical spine. Sinuses/Orbits: Left maxillary sinus mucosal thickening and mild mucosal thickening of scattered ethmoid air cells. No air-fluid levels. Unremarkable orbits. Other: No mastoid effusions. MRA HEAD FINDINGS Significantly limited MRA due to patient motion. Within this limitation: Anterior circulation: Bilateral internal carotid arteries are patent through the carotid siphons. Bilateral M1 and proximal M2 MCA branches are  patent without evidence of hemodynamically significant proximal stenosis. Bilateral ACAs are patent. No visible aneurysm. Posterior circulation: The intradural vertebral arteries are poorly visualized due to motion; however, there is suspected severe stenosis or occlusion of the right distal intradural vertebral artery and possible left intradural vertebral artery stenosis. Diminutive vertebrobasilar system with bilateral fetal type PCAs. There is suspected mild stenosis of the basilar artery. Bilateral PCAs are patent without evidence of hemodynamically significant proximal stenosis. No visible aneurysm. IMPRESSION: MRI head: 1. Small area of DWI hyperintensity without definite ADC correlate in the right frontal lobe, either T2 shine through/artifact or subacute infarct. 2. Remote large bilateral cerebellar infarcts and a remote pontine infarct. 3. Diffusely T1 hypointense marrow signal in the upper cervical spine, which is nonspecific and could relate to chronic anemia, chronic hypoxia (such as in smokers), or a lymphoproliferative disorder. MRA: 1. Significantly limited MRA secondary to patient motion. 2. The intradural vertebral arteries are poorly visualized; however, there is suspected severe stenosis or occlusion of the right distal intradural vertebral artery and possible left vertebral artery stenosis. A neck CTA could further characterize if clinically indicated. 3. Diminutive vertebrobasilar system with bilateral fetal type PCAs and likely mild narrowing of the basilar artery. 4. No large vessel occlusion or evidence of hemodynamically significant proximal stenosis in the anterior circulation Electronically Signed: By: Margaretha Sheffield MD On: 07/18/2020 19:25   MR FOOT LEFT WO CONTRAST  Result Date: 07/20/2020 CLINICAL DATA:  Osteomyelitis, great toe draining wound. EXAM: MRI OF THE LEFT FOOT WITHOUT CONTRAST TECHNIQUE: Multiplanar, multisequence MR imaging of the left forefoot was performed. No  intravenous contrast was administered. COMPARISON:  07/19/2020 radiographs FINDINGS: Bones/Joint/Cartilage Abnormal marrow edema in the distal and proximal phalanges of the great toe centered at the interphalangeal joint. The patient has a known new medial erosion of the  head of the proximal phalanx of the great toe. There is no joint effusion. No other significant abnormal osseous edema along the forefoot. Ligaments The Lisfranc ligament appears intact. Muscles and Tendons Low-grade edema signal along the plantar musculature of the foot, probably neurogenic. Muscular atrophy. Soft tissues Considerable subcutaneous edema in the forefoot, especially dorsally, extending into the toes. No visible drainable abscess. IMPRESSION: 1. Abnormal marrow edema in the distal and proximal phalanges of the great toe centered at the interphalangeal joint. The patient has a known new medial erosion of the head of the proximal phalanx of the great toe. Gout or osteomyelitis could cause this imaging appearance; presence of the wound along the great toe tends to favor infection, and if there is drainage then the likelihood of infection increases further. 2. Subcutaneous edema in the forefoot, especially dorsally, extending into the toes. No visible drainable abscess. 3. Low-grade edema signal along the plantar musculature of the foot, probably neurogenic. Electronically Signed   By: Van Clines M.D.   On: 07/20/2020 13:13   DG Chest Port 1 View  Result Date: 07/18/2020 CLINICAL DATA:  Sepsis. EXAM: PORTABLE CHEST 1 VIEW COMPARISON:  January 16, 2020. FINDINGS: The heart size and mediastinal contours are within normal limits. Both lungs are clear. The visualized skeletal structures are unremarkable. IMPRESSION: No active disease. Electronically Signed   By: Marijo Conception M.D.   On: 07/18/2020 14:20   DG Toe Great Left  Result Date: 07/19/2020 CLINICAL DATA:  Cellulitis of the foot. Left great toe wound for 8 weeks. EXAM:  LEFT GREAT TOE COMPARISON:  05/15/2020 FINDINGS: No acute fracture or dislocation. Generalized osteopenia. Erosion of the distal medial corner of the first proximal phalanx new compared with the prior examination of 05/15/2020 concerning for osteomyelitis versus a crystalline arthropathy such as gout. Soft tissue are unremarkable. No radiopaque foreign body or soft tissue emphysema. Peripheral vascular atherosclerotic disease. IMPRESSION: 1. No acute osseous injury of the left great toe. 2. Erosion of the distal medial corner of the first proximal phalanx new compared with the prior examination of 05/15/2020 concerning for osteomyelitis versus a crystalline arthropathy such as gout. Electronically Signed   By: Kathreen Devoid   On: 07/19/2020 15:30   VAS [place] ABI WITH/WO TBI  Result Date: 07/19/2020 LOWER EXTREMITY DOPPLER STUDY Indications: Ulceration, and peripheral artery disease. High Risk         Hypertension, hyperlipidemia, Diabetes, past history of Factors:          smoking.  Vascular Interventions: Prev RLE BKA. Limitations: Today's exam was limited due to an open wound and bandages. Comparison Study: Prev 09/2018 Non-compressible Performing Technologist: Vonzell Schlatter RVT  Examination Guidelines: A complete evaluation includes at minimum, Doppler waveform signals and systolic blood pressure reading at the level of bilateral brachial, anterior tibial, and posterior tibial arteries, when vessel segments are accessible. Bilateral testing is considered an integral part of a complete examination. Photoelectric Plethysmograph (PPG) waveforms and toe systolic pressure readings are included as required and additional duplex testing as needed. Limited examinations for reoccurring indications may be performed as noted.  ABI Findings: +--------+------------------+-----+---------+-------+ Left    Lt Pressure (mmHg)IndexWaveform Comment +--------+------------------+-----+---------+-------+ [id number]                     triphasic        +--------+------------------+-----+---------+-------+ PTA     254               1.54 biphasic         +--------+------------------+-----+---------+-------+  DP      254               1.54 biphasic         +--------+------------------+-----+---------+-------+ +-------+-----------+-----------+------------+------------+ ABI/TBIToday's ABIToday's TBIPrevious ABIPrevious TBI +-------+-----------+-----------+------------+------------+ Right  1.5 [place]                Non-comp                 +-------+-----------+-----------+------------+------------+ Left   1.5                 Non-comp                 +-------+-----------+-----------+------------+------------+ Arterial wall calcification precludes accurate ankle pressures and ABIs.  Summary: Left: Resting left ankle-brachial index indicates noncompressible left lower extremity arteries. ABIs are unreliable. Open wound left great toe.  *See table(s) above for measurements and observations.    Preliminary    ECHOCARDIOGRAM COMPLETE  Result Date: 07/19/2020    ECHOCARDIOGRAM REPORT   Patient Name:   MERRICK MAGGIO Date of Exam: 07/19/2020 Medical Rec #:  [id number]         Height:       73.0 in Accession #:    [id number]        Weight:       275.0 lb Date of Birth:  1935-11-26         BSA:          2.463 m Patient Age:    84 years          BP:           141/72 mmHg Patient Gender: M                 HR:           88 bpm. Exam Location:  Inpatient Procedure: 2D Echo, Cardiac Doppler and Color Doppler Indications:    Stroke 434.91 / I163.9  History:        Patient has no prior history of Echocardiogram examinations.                 CHF, Arrythmias:RBBB; Risk Factors:Hypertension, Dyslipidemia,                 Diabetes and Sleep Apnea.  Sonographer:    Elmarie Shiley Dance Referring Phys: [id number] MATTHEW M ECKSTAT IMPRESSIONS  1. Left ventricular ejection fraction, by estimation, is 55 to 60%. The left ventricle has normal  function. The left ventricle has no regional wall motion abnormalities. There is moderate left ventricular hypertrophy. Indeterminate diastolic filling due  to E-A fusion.  2. Right ventricular systolic function is normal. The right ventricular size is normal.  3. The pericardial effusion is posterior to the left ventricle.  4. The mitral valve is abnormal. Trivial mitral valve regurgitation.  5. The aortic valve is tricuspid. Aortic valve regurgitation is not visualized.  6. Aortic dilatation noted. There is mild dilatation of the aortic root, measuring 42 mm.  7. The inferior vena cava is dilated in size with >50% respiratory variability, suggesting right atrial pressure of 8 mmHg. Conclusion(s)/Recommendation(s): No intracardiac source of embolism detected on this transthoracic study. A transesophageal echocardiogram is recommended to exclude cardiac source of embolism if clinically indicated. FINDINGS  Left Ventricle: Left ventricular ejection fraction, by estimation, is 55 to 60%. The left ventricle has normal function. The left ventricle has no regional wall motion abnormalities. The left ventricular internal cavity size was normal in size. There is  moderate left ventricular hypertrophy. Indeterminate diastolic filling due to E-A fusion. Right Ventricle: The right ventricular size is normal. No increase in right ventricular wall thickness. Right ventricular systolic function is normal. Left Atrium: Left atrial size was normal in size. Right Atrium: Right atrial size was normal in size. Pericardium: Trivial pericardial effusion is present. The pericardial effusion is posterior to the left ventricle. Mitral Valve: The mitral valve is abnormal. Mild mitral annular calcification. Trivial mitral valve regurgitation. Tricuspid Valve: The tricuspid valve is grossly normal. Tricuspid valve regurgitation is trivial. Aortic Valve: The aortic valve is tricuspid. Aortic valve regurgitation is not visualized. Pulmonic  Valve: The pulmonic valve was normal in structure. Pulmonic valve regurgitation is not visualized. Aorta: Aortic dilatation noted. There is mild dilatation of the aortic root, measuring 42 mm. Venous: The inferior vena cava is dilated in size with greater than 50% respiratory variability, suggesting right atrial pressure of 8 mmHg. IAS/Shunts: No atrial level shunt detected by color flow Doppler.  LEFT VENTRICLE PLAX 2D LVIDd:         4.20 cm  Diastology LV PW:         1.20 cm  LV e' medial:    11.30 cm/s LV IVS:        1.40 cm  LV E/e' medial:  8.0 LVOT diam:     2.30 cm  LV e' lateral:   10.20 cm/s LV SV:         66       LV E/e' lateral: 8.8 LV SV Index:   27 LVOT Area:     4.15 cm  RIGHT VENTRICLE             IVC RV Basal diam:  2.10 cm     IVC diam: 2.00 cm RV S prime:     12.80 cm/s TAPSE (M-mode): 1.6 cm LEFT ATRIUM             Index       RIGHT ATRIUM           Index LA diam:        3.40 cm 1.38 cm/m  RA Area:     11.60 cm LA Vol (A2C):   70.5 ml 28.62 ml/m RA Volume:   22.90 ml  9.30 ml/m LA Vol (A4C):   56.3 ml 22.85 ml/m LA Biplane Vol: 64.4 ml 26.14 ml/m  AORTIC VALVE LVOT Vmax:   85.80 cm/s LVOT Vmean:  56.800 cm/s LVOT VTI:    0.160 m  AORTA Ao Root diam: 4.20 cm Ao Asc diam:  3.10 cm MITRAL VALVE MV Area (PHT): 4.15 cm    SHUNTS MV Decel Time: 183 msec    Systemic VTI:  0.16 m MV E velocity: 90.15 cm/s  Systemic Diam: 2.30 cm MV A velocity: 72.20 cm/s MV E/A ratio:  1.25 Lyman Bishop MD Electronically signed by Lyman Bishop MD Signature Date/Time: 07/19/2020/12:39:29 PM    Final    VAS US CAROTID (at [facility] and [facility] only)  Result Date: 07/19/2020 Carotid Arterial Duplex Study Indications:       CVA. Risk Factors:      Hypertension, hyperlipidemia, Diabetes, PAD. Comparison Study:  No previous exam Performing Technologist: Vonzell Schlatter RVT  Examination Guidelines: A complete evaluation includes B-mode imaging, spectral Doppler, color Doppler, and power Doppler as needed of all accessible portions  of each vessel. Bilateral testing is considered an integral part of a complete examination. Limited examinations for reoccurring indications may be performed as noted.  Right Carotid Findings: +----------+--------+--------+--------+------------------+-----------------+           PSV cm/sEDV cm/sStenosisPlaque DescriptionComments          +----------+--------+--------+--------+------------------+-----------------+ CCA Prox  89      14                                                  +----------+--------+--------+--------+------------------+-----------------+ CCA Distal77      17                                                  +----------+--------+--------+--------+------------------+-----------------+ ICA Prox  87      18      1-39%   heterogenous      Buld plaque noted +----------+--------+--------+--------+------------------+-----------------+ ICA Distal126     23                                                  +----------+--------+--------+--------+------------------+-----------------+ ECA       121                                                         +----------+--------+--------+--------+------------------+-----------------+ +----------+--------+-------+--------+-------------------+           PSV cm/sEDV cmsDescribeArm Pressure (mmHG) +----------+--------+-------+--------+-------------------+ Subclavian220                                        +----------+--------+-------+--------+-------------------+  Left Carotid Findings: +----------+--------+--------+--------+------------------+-----------------+           PSV cm/sEDV cm/sStenosisPlaque DescriptionComments          +----------+--------+--------+--------+------------------+-----------------+ CCA Prox  110     18                                                  +----------+--------+--------+--------+------------------+-----------------+ CCA Distal81      12                                                   +----------+--------+--------+--------+------------------+-----------------+ ICA Prox  85      17      1-39%   heterogenous      Bulb plaque noted +----------+--------+--------+--------+------------------+-----------------+ ICA Distal63      13                                                  +----------+--------+--------+--------+------------------+-----------------+ ECA       104     16                                                  +----------+--------+--------+--------+------------------+-----------------+ +----------+--------+--------+--------+-------------------+  PSV cm/sEDV cm/sDescribeArm Pressure (mmHG) +----------+--------+--------+--------+-------------------+ [id number]                                         +----------+--------+--------+--------+-------------------+ +---------+--------+--+--------+ VertebralPSV cm/s51EDV cm/s +---------+--------+--+--------+   Summary: Right Carotid: Velocities in the right ICA are consistent with a 1-39% stenosis. Left Carotid: Velocities in the left ICA are consistent with a 1-39% stenosis. Vertebrals:  Bilateral vertebral arteries demonstrate antegrade flow. Subclavians: Normal flow hemodynamics were seen in bilateral subclavian              arteries. *See table(s) above for measurements and observations.  Electronically signed by Monica Martinez MD on 07/19/2020 at 2:27:52 PM.    Final     Scheduled Meds: .  stroke: mapping our early stages of recovery book   Does not apply Once  . aspirin EC  81 mg Oral Daily  . clopidogrel  75 mg Oral Daily  . diltiazem  300 mg Oral Daily  . dorzolamide  1 drop Both Eyes BID  . enoxaparin (LOVENOX) injection  40 mg Subcutaneous Daily  . insulin aspart  0-6 Units Subcutaneous TID WC  . latanoprost  1 drop Both Eyes QHS  . metroNIDAZOLE  500 mg Oral Q8H  . multivitamin with minerals  1 tablet Oral Daily  . omega-3 acid ethyl esters  2 g Oral  Daily  . pantoprazole  40 mg Oral QHS  . rosuvastatin  10 mg Oral Q M,W,F   Continuous Infusions: . sodium chloride 50 mL/hr at 07/20/20 0543  . cefTRIAXone (ROCEPHIN)  IV Stopped (07/19/20 2228)     LOS: 2 days    Time spent: 35 mins    Andrej Spagnoli, MD [facility]   If 7PM-7AM, please contact night-coverage

## 2020-07-20 NOTE — Plan of Care (Signed)
  Problem: Ischemic Stroke/TIA Tissue Perfusion: Goal: Complications of ischemic stroke/TIA will be minimized Outcome: Progressing   Problem: Safety: Goal: Ability to remain free from injury will improve Outcome: Progressing   Problem: Coping: Goal: Level of anxiety will decrease Outcome: Progressing   Problem: Nutrition: Goal: Adequate nutrition will be maintained Outcome: Progressing

## 2020-07-20 NOTE — Evaluation (Signed)
Occupational Therapy Evaluation Patient Details Name: Micheal Walls MRN: [id number] DOB: [birth year] Today's Date: 07/20/2020    History of Present Illness 85 y.o. male presenting with generalized weakness. Imaging suspicious for R sided cerebral infarct of unclear age. MRI (+) remote large bilateral cerebellar infarcts anda  remote pontine infarct. Patient also found to have sepsis of unclear source and AKI. PMHx significant for diastolic CHF, DM, HTN, R BKA, L great toe diabetic ulcer, and anemia.   Clinical Impression   PTA patient was living in a private residence with his wife and adult grandson and was completing mobility with independent at wc level with intermittent use of standard walker for ambulation to bathroom. Patient reports wearing a R prosthetic since BKA in 2000 and recently has worn a surgical shoe 2/2 diabetic ulcer on L great toe (healing). Shoe not in room. RN notified. Patient does not drive but notes that his grandson provides transportation in a.m. before work. Patient able to don RLE prosthetic with set-up assist, complete lateral scoot transfer to recliner on R with Min guard +2 for safety and increased time/effort, and complete sit to stand transfer from low recliner to RW with Min A. Patient also endorses feeling slightly weaker than at his baseline with noted increased L-sided weakness LLE>LUE.  Patient would benefit from continued acute OT services in prep for safe d/c home with wife who is able to provide supervision but limited physical assist.    Follow Up Recommendations  Home health OT    Equipment Recommendations  Other (comment) (RW)    Recommendations for Other Services       Precautions / Restrictions Precautions Precautions: Fall Precaution Comments: R BKA with prosthesis, h/o old R CVA with mild L weakness Required Braces or Orthoses: Other Brace Other Brace: pt reports he came in with surgical shoe on L foot. Shoe is not in his belongings bag. RN  notified. He will need a new one if lost in ED Restrictions Weight Bearing Restrictions: No      Mobility Bed Mobility Overal bed mobility: Modified Independent             General bed mobility comments: HOB elevated and Mod I with increased time. Completed anterior scoots toward EOB with increased time/effort but not external assist.    Transfers Overall transfer level: Needs assistance Equipment used: Rolling walker (2 wheeled) Transfers: Sit to/from Stand;Lateral/Scoot Transfers Sit to Stand: Min assist;+2 safety/equipment        Lateral/Scoot Transfers: Min guard;+2 safety/equipment General transfer comment: min-guard to scoot laterally to R from bed to recliner, took increased time and effort and pt had some difficulty with positioning self. Pt later relayed that he usually performs a SPT at home. Sit<>stand from recliner with use of arm rests required min A +2 for fwd wt shift and stabilization of RW.    Balance Overall balance assessment: Needs assistance Sitting-balance support: Feet supported;No upper extremity supported Sitting balance-Leahy Scale: Good     Standing balance support: Bilateral upper extremity supported Standing balance-Leahy Scale: Poor Standing balance comment: heavily reliant on UE's for support                           ADL either performed or assessed with clinical judgement   ADL Overall ADL's : Needs assistance/impaired                     Lower Body Dressing: Set up Lower Body  Dressing Details (indicate cue type and reason): Set-up assist to don RLE prosthetic. Assist to don L sock for time management. Toilet Transfer: Minimal Production designer, theatre/television/film Details (indicate cue type and reason): Simulated with sit to stand from low recliner with cues for hand placement.                 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 4-/5 LUE Sensation: WNL LUE Coordination: decreased fine motor (Mild (chronic) from previous CVA.)   Lower Extremity Assessment Lower Extremity Assessment: RLE deficits/detail;LLE deficits/detail;Generalized weakness RLE Deficits / Details: hip flex 3+/5, knee ext 3+/5 RLE Sensation: decreased proprioception;history of peripheral neuropathy RLE Coordination: decreased gross motor LLE Deficits / Details: hip flex 2/5, knee ext 2+/5 LLE Sensation: decreased proprioception;history of peripheral neuropathy LLE Coordination: decreased gross motor   Cervical / Trunk Assessment Cervical / Trunk Assessment: Normal   Communication Communication Communication: HOH (Wears hearing aids (none present). Mild slurring of speech, near or at baseline.)   Cognition Arousal/Alertness: Awake/alert Behavior During Therapy: WFL for tasks assessed/performed Overall Cognitive Status: Within Functional Limits for tasks assessed                                     General Comments  pt donned prosthesis independently    Exercises Exercises: General Lower Extremity General Exercises - Lower Extremity Ankle Circles/Pumps: AROM;Left;10 reps;Seated Quad Sets: AROM;Left;10 reps;Seated Straight Leg Raises: AROM;Left;5 reps;Seated   Shoulder Instructions      Home Living Family/patient expects to be discharged to:: Private residence Living Arrangements: Spouse/significant other;Children Available Help at Discharge: Family;Available 24 hours/day Type of Home: House Home Access: Ramped entrance     Home Layout: One level      Bathroom Shower/Tub: Producer, television/film/video: Handicapped height Bathroom Accessibility: Yes   Home Equipment: Wheelchair - Economist - standard   Additional Comments: pt lives with wife who can provide supervision and grandson who drives them to morning appts but then goes to work      Prior Functioning/Environment Level of Independence: Independent with assistive device(s)        Comments: Patient uses wc for household and community mobility. Can self-propel. Can ambulate to bathroom with use of standard walker. Has not been putting much weight on LLE 2/2 diabetic ulcer.        OT Problem List: Decreased strength;Impaired balance (sitting and/or standing)      OT Treatment/Interventions: Self-care/ADL training;Therapeutic exercise;Energy conservation;DME and/or AE instruction;Therapeutic activities;Patient/family education;Balance training    OT Goals(Current goals can be found in the care plan section) Acute Rehab OT Goals Patient Stated Goal: return home OT Goal Formulation: With patient Time For Goal Achievement: 08/03/20 Potential to Achieve Goals: Good ADL Goals Pt Will Perform Lower Body Dressing: with supervision;sit to/from stand Pt Will Transfer to Toilet: with supervision;stand pivot transfer Pt Will Perform Toileting - Clothing Manipulation and hygiene: with supervision;sitting/lateral leans;sit to/from stand Pt Will Perform Tub/Shower Transfer: with supervision;Squat pivot transfer;Stand pivot transfer;shower seat;rolling walker  OT Frequency: Min 2X/week   Barriers to D/C:            Co-evaluation   Reason for Co-Treatment: Complexity of the patient's impairments (multi-system involvement);For patient/therapist safety PT goals addressed during session: Mobility/safety with mobility;Balance;Proper use of DME;Strengthening/ROM        AM-PAC OT "6 Clicks" Daily Activity     Outcome Measure Help from another person eating  meals?: None Help from another person taking care of personal grooming?: None Help from another person toileting, which includes using toliet, bedpan, or urinal?: A Little Help from another person bathing (including washing, rinsing, drying)?: A Lot Help from another person to put on and taking off regular upper body clothing?: A Little Help from another person to put on and taking off regular lower body clothing?: A Little 6 Click Score: 19   End of Session Equipment Utilized During Treatment: Gait belt;Rolling walker Nurse Communication: Mobility status  Activity Tolerance: Patient tolerated treatment well Patient left: in chair;with call bell/phone within reach;with chair alarm set  OT Visit Diagnosis: Unsteadiness on feet (R26.81);Muscle weakness (generalized) (M62.81)                Time: 3943-2003 OT Time Calculation (min): 39 min Charges:  OT General Charges $OT Visit: 1 Visit OT Evaluation $OT Eval Moderate Complexity: 1 Mod OT Treatments $Self Care/Home Management : 8-22 mins  Decorian Schuenemann H. OTR/L Supplemental OT, Department of rehab services [phone]  Ladarrius Bogdanski R H. 07/20/2020, 12:41 PM

## 2020-07-20 NOTE — Progress Notes (Signed)
PHARMACIST - PHYSICIAN COMMUNICATION DR:   Dwyane Dee CONCERNING: Antibiotic IV to Oral Route Change Policy  RECOMMENDATION: This patient is receiving Flagyl by the intravenous route.  Based on criteria approved by the Pharmacy and Therapeutics Committee, the antibiotic(s) is/are being converted to the equivalent oral dose form(s).   DESCRIPTION: These criteria include:  Patient being treated for a respiratory tract infection, urinary tract infection, cellulitis or clostridium difficile associated diarrhea if on metronidazole  The patient is not neutropenic and does not exhibit a GI malabsorption state  The patient is eating (either orally or via tube) and/or has been taking other orally administered medications for a least 24 hours  The patient is improving clinically and has a Tmax < 100.5  If you have questions about this conversion, please contact the Pharmacy Department  $RemoveBef'[]'goGBkUZceF$   [phone] )  Micheal Walls $RemoveBefo'[]'AHJBBuoQlVj$   [phone] )  Micheal Walls $RemoveBe'[x]'ecVsIjkAz$   [phone] )  Micheal Walls $RemoveBeforeD'[]'ANlnhzHkCodRGY$   [phone] )  Micheal Walls $RemoveBeforeDEI'[]'NEzTxAKeIpPqwhsR$   [phone] )  [facility]

## 2020-07-20 NOTE — Telephone Encounter (Signed)
I have already seen the patient -Dr Chauncey Cruel

## 2020-07-20 NOTE — Progress Notes (Signed)
Initial Nutrition Assessment  DOCUMENTATION CODES:   Obesity unspecified  INTERVENTION:   Ensure Max po TID, each supplement provides 150 kcal and 30 grams of protein  19ml Prosource Plus po TID, each supplement provides 100 kcals and 15 grams of protein  MVI with minerals daily   NUTRITION DIAGNOSIS:   Increased nutrient needs related to wound healing as evidenced by estimated needs.    GOAL:   Patient will meet greater than or equal to 90% of their needs    MONITOR:   PO intake,Supplement acceptance,Labs,Weight trends,I & O's,Skin  REASON FOR ASSESSMENT:   Consult Wound healing  ASSESSMENT:   Pt admitted for subacute R frontal stroke, AKI (improving), and sepsis 2/2 diabetic L foot infection. Pt with a PMH significant for CHF, HTN, DM w/ PVD s/p R BKA, anemia.  Podiatry was consulted and noted that pt is a poor surgical candidate given recent stroke and PVD.   Pt unavailable at time of RD visit.   No PO intake documented. Will order oral nutrition supplements given pt with increased nutrient needs.   Reviewed weight history. No significant weight changes noted.   UOP: 512ml documented today.   Medications: ss novolog TID with meals, mvi with minerals, lovaza Labs: Cr 2.04 (H, down from 2/5) CBGs 108-126  Diet Order:   Diet Order            Diet Heart Room service appropriate? Yes; Fluid consistency: Thin  Diet effective now                 EDUCATION NEEDS:   No education needs have been identified at this time  Skin:  Skin Assessment: Skin Integrity Issues: Skin Integrity Issues:: Other (Comment) Other: diabetic foot infection L foot anterior toe  Last BM:  PTA  Height:   Ht Readings from Last 1 Encounters:  07/18/20 $RemoveB'6\' 1"'ObCrISjB$  (1.854 m)    Weight:   Wt Readings from Last 1 Encounters:  07/18/20 124.7 kg   BMI:  Body mass index is 36.28 kg/m.  Estimated Nutritional Needs:   Kcal:  2500-2700  Protein:  155-165 grams  Fluid:   >2L    Larkin Ina, MS, RD, LDN RD pager number and weekend/on-call pager number located in [place].

## 2020-07-20 NOTE — Evaluation (Signed)
Physical Therapy Evaluation Patient Details Name: Micheal Walls MRN: [id number] DOB: May 29, 1936 Today's Date: 07/20/2020   History of Present Illness  85 y.o. male presenting with generalized weakness. Imaging suspicious for R sided cerebral infarct of unclear age. MRI (+) remote large bilateral cerebellar infarcts anda  remote pontine infarct. Patient also found to have sepsis of unclear source and AKI. PMHx significant for diastolic CHF, DM, HTN, R BKA, L great toe diabetic ulcer, and anemia.  Clinical Impression  Pt admitted with above diagnosis. Pt presents with LLE weakness with function that he reports is a bit worse than his baseline since past R CVA. Pt came to EOB with OT, mod I. He was able to don prosthesis independently EOB. Pt scoot laterally into recliner with min-guard A and increased time. He needed extra time for all mobility today. Pt practiced sit<>stand from recliner with min A for fwd wt shift and to steady RW. Pt has been using very old standard walker at home and would benefit from a bariatric RW. He relays that he came in with a post op shoe on L foot and it is not currently in his belongings bag. RN notified. If not found in ED, he will need a new one. Due to transportation limitations, recommend HHPT at d/c.  Pt currently with functional limitations due to the deficits listed below (see PT Problem List). Pt will benefit from skilled PT to increase their independence and safety with mobility to allow discharge to the venue listed below.       Follow Up Recommendations Home health PT    Equipment Recommendations  Rolling walker with 5" wheels;Other (comment) (bariatric)    Recommendations for Other Services       Precautions / Restrictions Precautions Precautions: Fall Precaution Comments: R BKA with prosthesis, h/o old R CVA with mild L weakness Required Braces or Orthoses: Other Brace Other Brace: pt reports he came in with surgical shoe on L foot. Shoe is not in  his belongings bag. RN notified. He will need a new one if lost in ED Restrictions Weight Bearing Restrictions: No      Mobility  Bed Mobility Overal bed mobility: Modified Independent             General bed mobility comments: mod I with OT. See OT note for further details    Transfers Overall transfer level: Needs assistance Equipment used: Rolling walker (2 wheeled) Transfers: Sit to/from Stand;Lateral/Scoot Transfers Sit to Stand: Min assist;+2 safety/equipment        Lateral/Scoot Transfers: Min guard;+2 safety/equipment General transfer comment: min-guard to scoot laterally to R from bed to recliner, took increased time and effort and pt had some difficulty with positioning self. Pt later relayed that he usually performs a SPT at home. Sit<>stand from recliner with use of arm rests required min A +2 for fwd wt shift and stabilization of RW.  Ambulation/Gait             General Gait Details: non ambulatory at baseline  Stairs            Wheelchair Mobility    Modified Rankin (Stroke Patients Only) Modified Rankin (Stroke Patients Only) Pre-Morbid Rankin Score: Moderately severe disability Modified Rankin: Moderately severe disability     Balance Overall balance assessment: Needs assistance Sitting-balance support: Feet supported;No upper extremity supported Sitting balance-Leahy Scale: Good     Standing balance support: Bilateral upper extremity supported Standing balance-Leahy Scale: Poor Standing balance comment: heavily reliant on UE's for  support                             Pertinent Vitals/Pain Pain Assessment: No/denies pain    Home Living Family/patient expects to be discharged to:: Private residence Living Arrangements: Spouse/significant other;Children Available Help at Discharge: Family;Available 24 hours/day Type of Home: House Home Access: Ramped entrance     Home Layout: One level Home Equipment: Wheelchair -  Insurance claims handler - standard Additional Comments: pt lives with wife who can provide supervision and grandson who drives them to morning appts but then goes to work    Prior Function Level of Independence: Independent with assistive device(s)         Comments: uses w/c as primary mobility since amputation. Transfers independently     Hand Dominance   Dominant Hand: Right    Extremity/Trunk Assessment   Upper Extremity Assessment Upper Extremity Assessment: Defer to OT evaluation    Lower Extremity Assessment Lower Extremity Assessment: RLE deficits/detail;LLE deficits/detail;Generalized weakness RLE Deficits / Details: hip flex 3+/5, knee ext 3+/5 RLE Sensation: decreased proprioception;history of peripheral neuropathy RLE Coordination: decreased gross motor LLE Deficits / Details: hip flex 2/5, knee ext 2+/5 LLE Sensation: decreased proprioception;history of peripheral neuropathy LLE Coordination: decreased gross motor    Cervical / Trunk Assessment Cervical / Trunk Assessment: Normal  Communication   Communication: HOH (mild slurring of speech, near or at baseline)  Cognition Arousal/Alertness: Awake/alert Behavior During Therapy: WFL for tasks assessed/performed Overall Cognitive Status: Within Functional Limits for tasks assessed                                        General Comments General comments (skin integrity, edema, etc.): pt donned prosthesis independently    Exercises General Exercises - Lower Extremity Ankle Circles/Pumps: AROM;Left;10 reps;Seated Quad Sets: AROM;Left;10 reps;Seated Straight Leg Raises: AROM;Left;5 reps;Seated   Assessment/Plan    PT Assessment Patient needs continued PT services  PT Problem List Decreased strength;Decreased activity tolerance;Decreased balance;Decreased mobility;Decreased coordination;Decreased knowledge of use of DME;Decreased knowledge of precautions;Obesity       PT Treatment  Interventions DME instruction;Functional mobility training;Therapeutic activities;Therapeutic exercise;Balance training;Neuromuscular re-education;Cognitive remediation;Patient/family education    PT Goals (Current goals can be found in the Care Plan section)  Acute Rehab PT Goals Patient Stated Goal: return home PT Goal Formulation: With patient Time For Goal Achievement: 08/03/20 Potential to Achieve Goals: Good    Frequency Min 4X/week   Barriers to discharge        Co-evaluation PT/OT/SLP Co-Evaluation/Treatment: Yes Reason for Co-Treatment: Complexity of the patient's impairments (multi-system involvement);For patient/therapist safety PT goals addressed during session: Mobility/safety with mobility;Balance;Proper use of DME;Strengthening/ROM         AM-PAC PT "6 Clicks" Mobility  Outcome Measure Help needed turning from your back to your side while in a flat bed without using bedrails?: None Help needed moving from lying on your back to sitting on the side of a flat bed without using bedrails?: None Help needed moving to and from a bed to a chair (including a wheelchair)?: A Little Help needed standing up from a chair using your arms (e.g., wheelchair or bedside chair)?: A Little Help needed to walk in hospital room?: Total Help needed climbing 3-5 steps with a railing? : Total 6 Click Score: 16    End of Session Equipment Utilized During Treatment: Gait belt  Activity Tolerance: Patient tolerated treatment well Patient left: in chair;with call bell/phone within reach Nurse Communication: Mobility status (missing op shoe) PT Visit Diagnosis: Muscle weakness (generalized) (M62.81);Unsteadiness on feet (R26.81)    Time: 1947-1252 PT Time Calculation (min) (ACUTE ONLY): 27 min   Charges:   PT Evaluation $PT Eval Moderate Complexity: [place], PT  Acute Rehab Services  Pager [phone] Office [place] 07/20/2020,  10:52 AM

## 2020-07-20 NOTE — Evaluation (Signed)
Speech Language Pathology Evaluation Patient Details Name: Micheal Walls MRN: [id number] DOB: 02-25-1936 Today's Date: 07/20/2020 Time: 7322-0254 SLP Time Calculation (min) (ACUTE ONLY): 16 min  Problem List:  Patient Active Problem List   Diagnosis Date Noted  . Cerebral thrombosis with cerebral infarction 07/20/2020  . Sepsis ([place]) 07/19/2020  . Thrombocytopenia ([place]) 07/19/2020  . Cerebral ischemia 07/18/2020  . Bilateral hearing loss 05/20/2020  . Does use hearing aid 05/20/2020  . Diabetic ulcer of left great toe ([place]) 01/16/2020  . Controlled type 2 diabetes mellitus with stable proliferative retinopathy of both eyes, with long-term current use of insulin ([place]) 01/06/2020  . Intermediate stage nonexudative age-related macular degeneration of both eyes 01/06/2020  . Left epiretinal membrane 01/06/2020  . [place] of right macula 01/06/2020  . AKI (acute kidney injury) ([place]) 11/02/2019  . Cellulitis 11/02/2019  . Elevated TSH 07/10/2018  . Nephropathy 02/28/2018  . Disturbance of skin sensation 02/28/2018  . CKD (chronic kidney disease) stage 4, GFR 15-29 ml/min ([facility]) 07/17/2017  . PAD (peripheral artery disease) ([place]) 07/17/2017  . 110.1 10/21/2013  . Epiphora 04/24/2013  . Laxity of eyelid 04/24/2013  . Anemia 04/09/2013  . History of peripheral vascular disease 04/09/2013  . History of stroke 04/09/2013  . S/P unilateral BKA (below knee amputation) ([place]) 03/17/2013  . Diabetes mellitus type 2 in obese ([place]) 03/17/2013  . Mixed hyperlipidemia 03/17/2013  . Essential hypertension 03/17/2013  . Gout 03/17/2013  . CKD (chronic kidney disease), stage III ([place]) 03/17/2013  . OSA on CPAP 03/17/2013  . Anemia, iron deficiency 03/17/2013  . Diverticulosis of colon 03/17/2013  . RBBB 03/17/2013  . Punctal stenosis, acquired 10/02/2012   Past Medical History:  Past Medical History:  Diagnosis Date  . Acute metabolic encephalopathy 07/20/621  . Amputated below knee ([place])     right  . Anemia   . Aspiration pneumonia ([place]) 11/02/2019  . Diastolic heart failure ([place])   . Diverticulosis   . DM (diabetes mellitus) ([place])   . Gout   . Hyperlipemia   . OSA on CPAP   . RBBB   . Sepsis ([place]) 11/02/2019  . Systemic hypertension    Past Surgical History:  Past Surgical History:  Procedure Laterality Date  . LEG AMPUTATION BELOW KNEE     right  . NM MYOCAR PERF WALL MOTION  09/18/2009   no ischemia   HPI:  Pt is an 85 y.o. male with history of of diastolic heart failure, diabetes, hypertension, right BKA, anemia, who presented with weakness. MRI Brain with a small R frontal subcortical infarct and remote large bilateral cerebellar infarcts and remote pontine infarct.   Assessment / Plan / Recommendation Clinical Impression  Pt participated in speech/language evaluation. Pt reported that he lives with his wife and that he usually manages the medications and finances. Pt reported baseline dysarthria from a prior CVA. Per the pt, he has some difficulty with memory and compensates for this by writing important dates on a calendar. His language skills are currently within normal limits, but repetition was necessary due to hearing impairment. Mild dysarthria was demonstrated, but speech was intelligible. His cognitive-linguistic skills were within functional limits with some difficulty with memory and the need for additional processing time for higher-level executive function which pt reported are both at baseline. Further skilled SLP services are not clinically indicated at this time. Pt was educated regarding results and recommendations; pt verbalized understanding as well as agreement with plan of care.    SLP  Assessment  SLP Recommendation/Assessment: Patient does not need any further Speech Lanaguage Pathology Services SLP Visit Diagnosis: Cognitive communication deficit (R41.841)    Follow Up Recommendations  None    Frequency and Duration           SLP  Evaluation Cognition  Overall Cognitive Status: Within Functional Limits for tasks assessed Arousal/Alertness: Awake/alert Orientation Level: Oriented X4 Attention: Focused;Sustained Focused Attention: Appears intact Sustained Attention: Appears intact Memory: Impaired Memory Impairment: Retrieval deficit;Decreased recall of new information (Immediate: 4/5; delayed:) Awareness: Appears intact Problem Solving: Appears intact Executive Function: Reasoning;Sequencing;Organizing Reasoning: Appears intact (with additional processing time) Sequencing: Appears intact       Comprehension  Auditory Comprehension Overall Auditory Comprehension: Appears within functional limits for tasks assessed Yes/No Questions: Within Functional Limits Commands: Within Functional Limits Conversation: Complex    Expression Expression Primary Mode of Expression: Verbal Verbal Expression Overall Verbal Expression: Appears within functional limits for tasks assessed Initiation: No impairment Level of Generative/Spontaneous Verbalization: Conversation Repetition: No impairment Naming: No impairment Pragmatics: No impairment Written Expression Dominant Hand: Right   Oral / Motor  Oral Motor/Sensory Function Overall Oral Motor/Sensory Function: Within functional limits Motor Speech Overall Motor Speech: Appears within functional limits for tasks assessed Respiration: Within functional limits Phonation: Normal Resonance: Within functional limits Articulation: Within functional limitis Intelligibility: Intelligible Motor Planning: Witnin functional limits Motor Speech Errors: Not applicable   Micheal Walls I. Hardin Negus, [place], [place] Office number [phone] Pager [place] 07/20/2020, 1:48 PM

## 2020-07-20 NOTE — Progress Notes (Signed)
LAYKEN, DOENGES ([id number]) Visit Report for 07/14/2020 Debridement Details Patient Name: Date of Service: Micheal Walls, Micheal Walls 07/14/2020 9:45 A M Medical Record Number: [id number] Patient Account Number: [id number] Date of Birth/Sex: Treating RN: [birth year] (85 y.o. [name], [name] Primary Care Provider: [name] Other Clinician: Referring Provider: Treating Provider/Extender: Lynden Ang in Treatment: 14 Debridement Performed for Assessment: Wound #1 Left T Great oe Performed By: Physician Ricard Dillon., MD Debridement Type: Debridement Severity of Tissue Pre Debridement: Fat layer exposed Level of Consciousness (Pre-procedure): Awake and Alert Pre-procedure Verification/Time Out Yes - 10:52 Taken: Start Time: 10:52 Pain Control: Lidocaine T Area Debrided (L x W): otal 0.3 (cm) x 0.3 (cm) = 0.09 (cm) Tissue and other material debrided: Viable, Non-Viable, Callus, Slough, Subcutaneous, Skin: Dermis , Skin: Epidermis, Slough Level: Skin/Subcutaneous Tissue Debridement Description: Excisional Instrument: Curette Bleeding: Minimum Hemostasis Achieved: Pressure End Time: 10:53 Procedural Pain: 0 Post Procedural Pain: 0 Response to Treatment: Procedure was tolerated well Level of Consciousness (Post- Awake and Alert procedure): Post Debridement Measurements of Total Wound Length: (cm) 0.3 Width: (cm) 0.3 Depth: (cm) 0.3 Volume: (cm) 0.021 Character of Wound/Ulcer Post Debridement: Improved Severity of Tissue Post Debridement: Fat layer exposed Post Procedure Diagnosis Same as Pre-procedure Electronic Signature(s) Signed: 07/14/2020 4:57:44 PM By: Linton Ham MD Signed: 07/20/2020 9:09:54 AM By: Rhae Hammock RN Entered By: Linton Ham on 07/14/2020 11:16:03 -------------------------------------------------------------------------------- Debridement Details Patient Name: Date of Service: Micheal Quails D. 07/14/2020  9:45 A M Medical Record Number: [id number] Patient Account Number: [id number] Date of Birth/Sex: Treating RN: 1935-12-13 (85 y.o. [name], [name] Primary Care Provider: [name] Other Clinician: Referring Provider: Treating Provider/Extender: Lynden Ang in Treatment: 14 Debridement Performed for Assessment: Wound #2 Left,Plantar T Great oe Performed By: Physician Ricard Dillon., MD Debridement Type: Debridement Severity of Tissue Pre Debridement: Fat layer exposed Level of Consciousness (Pre-procedure): Awake and Alert Pre-procedure Verification/Time Out Yes - 10:53 Taken: Start Time: 10:53 Pain Control: Lidocaine T Area Debrided (L x W): otal 0.6 (cm) x 0.3 (cm) = 0.18 (cm) Tissue and other material debrided: Viable, Non-Viable, Callus, Slough, Subcutaneous, Skin: Dermis , Skin: Epidermis, Slough Level: Skin/Subcutaneous Tissue Debridement Description: Excisional Instrument: Curette Bleeding: Minimum Hemostasis Achieved: Pressure End Time: 10:53 Procedural Pain: 0 Post Procedural Pain: 0 Response to Treatment: Procedure was tolerated well Level of Consciousness (Post- Awake and Alert procedure): Post Debridement Measurements of Total Wound Length: (cm) 0.6 Width: (cm) 0.3 Depth: (cm) 0.1 Volume: (cm) 0.014 Character of Wound/Ulcer Post Debridement: Improved Severity of Tissue Post Debridement: Fat layer exposed Post Procedure Diagnosis Same as Pre-procedure Electronic Signature(s) Signed: 07/14/2020 4:57:44 PM By: Linton Ham MD Signed: 07/20/2020 9:09:54 AM By: Rhae Hammock RN Entered By: Linton Ham on 07/14/2020 11:16:21 -------------------------------------------------------------------------------- HPI Details Patient Name: Date of Service: Micheal Quails D. 07/14/2020 9:45 A M Medical Record Number: [id number] Patient Account Number: [id number] Date of Birth/Sex: Treating RN: 03/24/1936 (85 y.o. [name] Primary Care Provider: [name] Other Clinician: Referring Provider: Treating Provider/Extender: Lynden Ang in Treatment: 14 History of Present Illness HPI Description: 02/19/2020 upon evaluation today patient presents today for initial evaluation in clinic concerning issues that has been having with his bilateral lower extremities. He apparently also had a wound on his great toe. With that being said he does have a history of diabetes mellitus type 2, lymphedema, peripheral vascular disease, congestive heart failure, hypertension, right below-knee amputation, and chronic kidney disease stage  IV according to records reviewed with his creatinine clearance being below 30. With that being said the legs have been actually worse than what they are currently he does not seem to have any open wounds at this point significantly he does have a small leaking area on the left leg at this point. With that being said the majority of what I see is a lot of skin changes secondary to the chronic lymphedema to be honest. There does not appear to be any signs of systemic infection or local infection based on what I see today. The patient is doing much better with regard to the wound on his toe as well according to the daughter. He has been seeing podiatry. They do have questions about whether or not he can perform physical therapy to have no limitations on physical therapy with regard to his wounds to be honest. With that being said he questions having some pain in the ankle region which again I am not really the specialist to comment on that therefore I recommended that he probably needs to discuss this with his podiatrist. 02/26/2020 upon evaluation today patient appears to actually be doing extremely well he is completely healed and overall is looking excellent. They did get the compression stockings ordered yesterday so they should be on the way hopefully arriving if not  tomorrow than Friday. In the meantime we may just utilize a Ace wrap to try to keep the edema under as much control as possible until he gets his compression stockings. READMISSION 04/03/2021 This is an 85 year old man we had for 2 visits in the clinic in September seen by Jeri Cos. Patient has chronic venous insufficiency with secondary lymphedema. He had wounds on the left anterior leg these healed out. He is also known to Dr. Doren Custard last seen in August 2020. He is known to have an occluded PTA with monophasic waveforms has an 8 occluded tibioperoneal trunk. At the time he was not felt to be a candidate for angiography because of his stage IV chronic renal failure and his wounds were closed. If he was to require angiography he would need a CO2 angiogram. His last arterial studies were in August 2020 as well. He had triphasic waveforms down to the mid popliteal biphasic distally and then monophasic at the dorsalis of the tibioperoneal trunk, ATA occluded PTA monophasic distal PTA his peroneal was not visualized. He wears compression stockings but did not bring 1 in today He thinks the wound currently we are looking at started about 2 weeks ago. He thinks it is because of abrasion on his foot wear. He has not been wearing his shoe he switched into a left surgical sandal. He has been cleaning this with saline and wrapping it with gauze. ABI in our clinic was 0.52. Please visit previous arterial work-up by Dr. Doren Custard in August 2020 as noted. 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 We use silver collagen. He has known PAD and very significant lymphedema. I have not ordered arterial studies or vascular consult unless this area worsens 11/12; this is a patient with a punched-out wound over the interphalangeal joint of his left great  toe dorsally. He has severe PAD which is not listed is being amenable to revascularization or angiography because of his stage IV chronic renal failure. We have been using  collagen 12/3; punched-out area over the inner phalangeal joint of the left great toe in the setting of known PAD. He was not felt previously to be a good revascularization candidate because of renal insufficiency. We have been using collagen. The patient states he noticed this draining about 2 to 3 days ago. When he arrived in clinic our intake nurse noted purulent drainage and wound a lot larger especially in depth of 0.7 cm 12/10; patient with a wound on the dorsal part in the inner phalangeal joint of the left great toe. This deteriorated quite a bit the last time he was here. A culture I did showed Staphylococcus lugdunensis. I put him on doxycycline and I will extend this today. X-ray showed no evidence of osteomyelitis. Really not any better today there is exposed bone at the bottom of this undermining that is hard to measure 12/17; is a patient with a wound on the dorsal part of the inter phalangeal joint of his left great toe. I have him on antibiotics and I gave him a 2-week course of doxycycline this seems to have close down the wound quite a bit and there is no longer palpable bone. 12/28; dorsal part of the interphalangeal joint of the left great toe. There is no palpable bone which is improvement. He completed antibiotics. Has been using silver collagen 1/11; dorsal part of the interphalangeal joint of the left great toe. Again today there was exposed bone. This is not particularly new but had not been obvious over the last 2 weeks. We have been using Hydrofera Blue. X-ray I did last month was negative for osteomyelitis 1/25; dorsal part of the inner phalangeal joint of the left great toe. BONE SCRAPING for PCR I did last time was completely negative, no pathogens identified. We have been using Hydrofera Blue with no  improvement 2/1; dorsal part of the interphalangeal joint. He also has a new area on the plantar part of the left great toe. We have not nearly been making much improvement here. He does have known PAD Electronic Signature(s) Signed: 07/14/2020 4:57:44 PM By: Linton Ham MD Entered By: Linton Ham on 07/14/2020 11:20:18 -------------------------------------------------------------------------------- Physical Exam Details Patient Name: Date of Service: Micheal Quails D. 07/14/2020 9:45 A M Medical Record Number: [id number] Patient Account Number: [id number] Date of Birth/Sex: Treating RN: [birth year] (85 y.o. [name] Primary Care Provider: [name] Other Clinician: Referring Provider: Treating Provider/Extender: Lynden Ang in Treatment: 14 Constitutional Sitting or standing Blood Pressure is within target range for patient.. Pulse regular and within target range for patient.Marland Kitchen Respirations regular, non-labored and within target range.. Temperature is normal and within the target range for the patient.Marland Kitchen Appears in no distress. Notes Wound exam; open wound on the dorsal interphalangeal joint of the left great toe. Once again requiring debridement with a #3 curette completely nonviable surface. He has a new denuded area on the plantar aspect of the toe with a boggy tissue underneath. This concerns me. There is no gross infection that I see. He has stage III lymphedema but I do not think this has anything to do with the wounds he has in the plantar toe. Electronic Signature(s) Signed: 07/14/2020 4:57:44 PM By: Linton Ham MD Entered By: Linton Ham on 07/14/2020 11:22:22 -------------------------------------------------------------------------------- Physician Orders Details Patient Name: Date of Service: Micheal Quails D. 07/14/2020 9:45 A M Medical Record Number: [id number] Patient Account Number: [id number] Date of Birth/Sex: Treating  RN: Dec 29, 1935 (85 y.o. [name] Primary Care  Provider: [name] Other Clinician: Referring Provider: Treating Provider/Extender: Lynden Ang in Treatment: [phone] Verbal / Phone Orders: No Diagnosis Coding Follow-up Appointments Return Appointment in 1 week. Bathing/ Shower/ Hygiene May shower and wash wound with soap and water. Edema Control - Lymphedema / SCD / Other Bilateral Lower Extremities Elevate legs to the level of the heart or above for 30 minutes daily and/or when sitting, a frequency of: - throughout the day Avoid standing for long periods of time. Patient to wear own compression stockings every day. - both legs Exercise regularly Moisturize legs daily. Off-Loading Wound #1 Left T Great oe Other: - use felt for offloading Wound #2 Left,Plantar T Great oe Other: - use felt for offloading Wound Treatment Wound #1 - T Great oe Wound Laterality: Left Cleanser: Normal Saline (Generic) Every Other Day/15 Days Discharge Instructions: Cleanse the wound with Normal Saline prior to applying a clean dressing using gauze sponges, not tissue or cotton balls. Cleanser: Soap and Water Every Other Day/15 Days Discharge Instructions: May shower and wash wound with dial antibacterial soap and water prior to dressing change. Prim Dressing: KerraCel Ag Gelling Fiber Dressing, 4x5 in (silver alginate) Every Other Day/15 Days ary Discharge Instructions: Apply silver alginate to wound bed as instructed Secondary Dressing: Woven Gauze Sponge, Non-Sterile 4x4 in (Generic) Every Other Day/15 Days Discharge Instructions: Apply over primary dressing as directed. Secondary Dressing: Woven Gauze Sponges 2x2 in (Generic) Every Other Day/15 Days Discharge Instructions: Apply over primary dressing as directed. Secured With: Child psychotherapist, Sterile 2x75 (in/in) (Generic) Every Other Day/15 Days Discharge Instructions: Secure with stretch  gauze as directed. Secured With: Paper Tape, 1x10 (in/yd) (Generic) Every Other Day/15 Days Discharge Instructions: Secure dressing with tape as directed. Wound #2 - T Great oe Wound Laterality: Plantar, Left Cleanser: Normal Saline (Generic) Every Other Day/15 Days Discharge Instructions: Cleanse the wound with Normal Saline prior to applying a clean dressing using gauze sponges, not tissue or cotton balls. Cleanser: Soap and Water Every Other Day/15 Days Discharge Instructions: May shower and wash wound with dial antibacterial soap and water prior to dressing change. Prim Dressing: KerraCel Ag Gelling Fiber Dressing, 4x5 in (silver alginate) Every Other Day/15 Days ary Discharge Instructions: Apply silver alginate to wound bed as instructed Secondary Dressing: Woven Gauze Sponge, Non-Sterile 4x4 in (Generic) Every Other Day/15 Days Discharge Instructions: Apply over primary dressing as directed. Secondary Dressing: Woven Gauze Sponges 2x2 in (Generic) Every Other Day/15 Days Discharge Instructions: Apply over primary dressing as directed. Secured With: Child psychotherapist, Sterile 2x75 (in/in) (Generic) Every Other Day/15 Days Discharge Instructions: Secure with stretch gauze as directed. Secured With: Paper Tape, 1x10 (in/yd) (Generic) Every Other Day/15 Days Discharge Instructions: Secure dressing with tape as directed. Electronic Signature(s) Signed: 07/14/2020 4:57:44 PM By: Linton Ham MD Signed: 07/20/2020 9:09:54 AM By: Rhae Hammock RN Entered By: Rhae Hammock on 07/14/2020 11:02:40 -------------------------------------------------------------------------------- Problem List Details Patient Name: Date of Service: Micheal Quails D. 07/14/2020 9:45 A M Medical Record Number: [id number] Patient Account Number: [id number] Date of Birth/Sex: Treating RN: Oct 22, 1935 (85 y.o. [name], [name] Primary Care Provider: [name] Other Clinician: Referring  Provider: Treating Provider/Extender: Lynden Ang in Treatment: 14 Active Problems ICD-10 Encounter Code Description Active Date MDM Diagnosis E11.621 Type 2 diabetes mellitus with foot ulcer 04/03/2020 No Yes E11.51 Type 2 diabetes mellitus with diabetic peripheral angiopathy without gangrene 04/03/2020 No Yes L97.528 Non-pressure chronic ulcer of other part of left foot  with other specified 07/07/2020 No Yes severity Inactive Problems Resolved Problems Electronic Signature(s) Signed: 07/14/2020 4:57:44 PM By: Linton Ham MD Entered By: Linton Ham on 07/14/2020 11:13:56 -------------------------------------------------------------------------------- Progress Note Details Patient Name: Date of Service: Micheal Quails D. 07/14/2020 9:45 A M Medical Record Number: [id number] Patient Account Number: [id number] Date of Birth/Sex: Treating RN: 05/21/1936 (85 y.o. [name], [name] Primary Care Provider: [name] Other Clinician: Referring Provider: Treating Provider/Extender: Lynden Ang in Treatment: 14 Subjective History of Present Illness (HPI) 02/19/2020 upon evaluation today patient presents today for initial evaluation in clinic concerning issues that has been having with his bilateral lower extremities. He apparently also had a wound on his great toe. With that being said he does have a history of diabetes mellitus type 2, lymphedema, peripheral vascular disease, congestive heart failure, hypertension, right below-knee amputation, and chronic kidney disease stage IV according to records reviewed with his creatinine clearance being below 30. With that being said the legs have been actually worse than what they are currently he does not seem to have any open wounds at this point significantly he does have a small leaking area on the left leg at this point. With that being said the majority of what I see is a lot of  skin changes secondary to the chronic lymphedema to be honest. There does not appear to be any signs of systemic infection or local infection based on what I see today. The patient is doing much better with regard to the wound on his toe as well according to the daughter. He has been seeing podiatry. They do have questions about whether or not he can perform physical therapy to have no limitations on physical therapy with regard to his wounds to be honest. With that being said he questions having some pain in the ankle region which again I am not really the specialist to comment on that therefore I recommended that he probably needs to discuss this with his podiatrist. 02/26/2020 upon evaluation today patient appears to actually be doing extremely well he is completely healed and overall is looking excellent. They did get the compression stockings ordered yesterday so they should be on the way hopefully arriving if not tomorrow than Friday. In the meantime we may just utilize a Ace wrap to try to keep the edema under as much control as possible until he gets his compression stockings. READMISSION 04/03/2021 This is an 85 year old man we had for 2 visits in the clinic in September seen by Jeri Cos. Patient has chronic venous insufficiency with secondary lymphedema. He had wounds on the left anterior leg these healed out. He is also known to Dr. Doren Custard last seen in August 2020. He is known to have an occluded PTA with monophasic waveforms has an 8 occluded tibioperoneal trunk. At the time he was not felt to be a candidate for angiography because of his stage IV chronic renal failure and his wounds were closed. If he was to require angiography he would need a CO2 angiogram. His last arterial studies were in August 2020 as well. He had triphasic waveforms down to the mid popliteal biphasic distally and then monophasic at the dorsalis of the tibioperoneal trunk, ATA occluded PTA monophasic distal PTA his  peroneal was not visualized. He wears compression stockings but did not bring 1 in today He thinks the wound currently we are looking at started about 2 weeks ago. He thinks it is because of abrasion on his foot wear. He  has not been wearing his shoe he switched into a left surgical sandal. He has been cleaning this with saline and wrapping it with gauze. ABI in our clinic was 0.52. Please visit previous arterial work-up by Dr. Doren Custard in August 2020 as noted. 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 We use silver collagen. He has known PAD and very significant lymphedema. I have not ordered arterial studies or vascular consult unless this area worsens 11/12; this is a patient with a punched-out wound over the interphalangeal joint of his left great toe dorsally. He has severe PAD which is not listed is being amenable to revascularization or angiography because of his stage IV chronic renal failure. We have been using collagen 12/3; punched-out area over the inner phalangeal joint of the left great toe in the setting of known PAD. He was not felt previously to be a good revascularization candidate because of renal insufficiency. We have been using collagen. The patient states he noticed this draining about 2 to 3 days ago. When he arrived in clinic our intake nurse noted purulent drainage and wound a lot larger especially in depth of 0.7 cm 12/10; patient with a wound on the dorsal part in the inner phalangeal joint of the left great toe. This deteriorated quite a bit the last time he was here. A culture I did showed Staphylococcus lugdunensis. I put him on doxycycline and I will extend this today. X-ray showed no evidence of osteomyelitis. Really not any better today there is exposed bone at the bottom of this undermining that is  hard to measure 12/17; is a patient with a wound on the dorsal part of the inter phalangeal joint of his left great toe. I have him on antibiotics and I gave him a 2-week course of doxycycline this seems to have close down the wound quite a bit and there is no longer palpable bone. 12/28; dorsal part of the interphalangeal joint of the left great toe. There is no palpable bone which is improvement. He completed antibiotics. Has been using silver collagen 1/11; dorsal part of the interphalangeal joint of the left great toe. Again today there was exposed bone. This is not particularly new but had not been obvious over the last 2 weeks. We have been using Hydrofera Blue. X-ray I did last month was negative for osteomyelitis 1/25; dorsal part of the inner phalangeal joint of the left great toe. BONE SCRAPING for PCR I did last time was completely negative, no pathogens identified. We have been using Hydrofera Blue with no improvement 2/1; dorsal part of the interphalangeal joint. He also has a new area on the plantar part of the left great toe. We have not nearly been making much improvement here. He does have known PAD Objective Constitutional Sitting or standing Blood Pressure is within target range for patient.. Pulse regular and within target range for patient.Marland Kitchen Respirations regular, non-labored and within target range.. Temperature is normal and within the target range for the patient.Marland Kitchen Appears in no distress. Vitals Time Taken: 9:30 AM, Height: 73 in, Weight: 275 lbs, BMI: 36.3, Temperature: 97.6 F, Pulse: 84 bpm, Respiratory Rate: 18 breaths/min, Blood Pressure: 110/63 mmHg, Capillary Blood Glucose: 160 mg/dl. General Notes: Wound exam; open wound on the dorsal interphalangeal joint of the left great toe. Once again requiring debridement with a #  3 curette completely nonviable surface. He has a new denuded area on the plantar aspect of the toe with a boggy tissue underneath. This concerns me.  There is no gross infection that I see. He has stage III lymphedema but I do not think this has anything to do with the wounds he has in the plantar toe. Integumentary (Hair, Skin) Wound #1 status is Open. Original cause of wound was Blister. The wound is located on the Left T Great. The wound measures 0.3cm length x 0.3cm width x oe 0.3cm depth; 0.071cm^2 area and 0.021cm^3 volume. There is Fat Layer (Subcutaneous Tissue) exposed. There is no tunneling noted, however, there is undermining starting at 12:00 and ending at 12:00 with a maximum distance of 0.2cm. There is a medium amount of serosanguineous drainage noted. The wound margin is well defined and not attached to the wound base. There is large (67-100%) pink, pale granulation within the wound bed. There is no necrotic tissue within the wound bed. Wound #2 status is Open. Original cause of wound was Pressure Injury. The wound is located on the [employer]. The wound measures 0.6cm length oe x 0.3cm width x 0.1cm depth; 0.141cm^2 area and 0.014cm^3 volume. There is Fat Layer (Subcutaneous Tissue) exposed. There is no tunneling or undermining noted. There is a medium amount of serosanguineous drainage noted. The wound margin is flat and intact. There is large (67-100%) pink, hyper - granulation within the wound bed. There is no necrotic tissue within the wound bed. Assessment Active Problems ICD-10 Type 2 diabetes mellitus with foot ulcer Type 2 diabetes mellitus with diabetic peripheral angiopathy without gangrene Non-pressure chronic ulcer of other part of left foot with other specified severity Procedures Wound #1 Pre-procedure diagnosis of Wound #1 is a Diabetic Wound/Ulcer of the Lower Extremity located on the Left T Great .Severity of Tissue Pre Debridement is: oe Fat layer exposed. There was a Excisional Skin/Subcutaneous Tissue Debridement with a total area of 0.09 sq cm performed by Ricard Dillon., MD. With the  following instrument(s): Curette to remove Viable and Non-Viable tissue/material. Material removed includes Callus, Subcutaneous Tissue, Slough, Skin: Dermis, and Skin: Epidermis after achieving pain control using Lidocaine. No specimens were taken. A time out was conducted at 10:52, prior to the start of the procedure. A Minimum amount of bleeding was controlled with Pressure. The procedure was tolerated well with a pain level of 0 throughout and a pain level of 0 following the procedure. Post Debridement Measurements: 0.3cm length x 0.3cm width x 0.3cm depth; 0.021cm^3 volume. Character of Wound/Ulcer Post Debridement is improved. Severity of Tissue Post Debridement is: Fat layer exposed. Post procedure Diagnosis Wound #1: Same as Pre-Procedure Wound #2 Pre-procedure diagnosis of Wound #2 is a Diabetic Wound/Ulcer of the Lower Extremity located on the [place] .Severity of Tissue Pre oe Debridement is: Fat layer exposed. There was a Excisional Skin/Subcutaneous Tissue Debridement with a total area of 0.18 sq cm performed by Ricard Dillon., MD. With the following instrument(s): Curette to remove Viable and Non-Viable tissue/material. Material removed includes Callus, Subcutaneous Tissue, Slough, Skin: Dermis, and Skin: Epidermis after achieving pain control using Lidocaine. No specimens were taken. A time out was conducted at 10:53, prior to the start of the procedure. A Minimum amount of bleeding was controlled with Pressure. The procedure was tolerated well with a pain level of 0 throughout and a pain level of 0 following the procedure. Post Debridement Measurements: 0.6cm length x 0.3cm width x 0.1cm  depth; 0.014cm^3 volume. Character of Wound/Ulcer Post Debridement is improved. Severity of Tissue Post Debridement is: Fat layer exposed. Post procedure Diagnosis Wound #2: Same as Pre-Procedure Plan Follow-up Appointments: Return Appointment in 1 week. Bathing/ Shower/  Hygiene: May shower and wash wound with soap and water. Edema Control - Lymphedema / SCD / Other: Elevate legs to the level of the heart or above for 30 minutes daily and/or when sitting, a frequency of: - throughout the day Avoid standing for long periods of time. Patient to wear own compression stockings every day. - both legs Exercise regularly Moisturize legs daily. Off-Loading: Wound #1 Left T Great: oe Other: - use felt for offloading Wound #2 Left,Plantar T Great: oe Other: - use felt for offloading WOUND #1: - T Great Wound Laterality: Left oe Cleanser: Normal Saline (Generic) Every Other Day/15 Days Discharge Instructions: Cleanse the wound with Normal Saline prior to applying a clean dressing using gauze sponges, not tissue or cotton balls. Cleanser: Soap and Water Every Other Day/15 Days Discharge Instructions: May shower and wash wound with dial antibacterial soap and water prior to dressing change. Prim Dressing: KerraCel Ag Gelling Fiber Dressing, 4x5 in (silver alginate) Every Other Day/15 Days ary Discharge Instructions: Apply silver alginate to wound bed as instructed Secondary Dressing: Woven Gauze Sponge, Non-Sterile 4x4 in (Generic) Every Other Day/15 Days Discharge Instructions: Apply over primary dressing as directed. Secondary Dressing: Woven Gauze Sponges 2x2 in (Generic) Every Other Day/15 Days Discharge Instructions: Apply over primary dressing as directed. Secured With: Child psychotherapist, Sterile 2x75 (in/in) (Generic) Every Other Day/15 Days Discharge Instructions: Secure with stretch gauze as directed. Secured With: Paper T ape, 1x10 (in/yd) (Generic) Every Other Day/15 Days Discharge Instructions: Secure dressing with tape as directed. WOUND #2: - T Great Wound Laterality: Plantar, Left oe Cleanser: Normal Saline (Generic) Every Other Day/15 Days Discharge Instructions: Cleanse the wound with Normal Saline prior to applying a clean  dressing using gauze sponges, not tissue or cotton balls. Cleanser: Soap and Water Every Other Day/15 Days Discharge Instructions: May shower and wash wound with dial antibacterial soap and water prior to dressing change. Prim Dressing: KerraCel Ag Gelling Fiber Dressing, 4x5 in (silver alginate) Every Other Day/15 Days ary Discharge Instructions: Apply silver alginate to wound bed as instructed Secondary Dressing: Woven Gauze Sponge, Non-Sterile 4x4 in (Generic) Every Other Day/15 Days Discharge Instructions: Apply over primary dressing as directed. Secondary Dressing: Woven Gauze Sponges 2x2 in (Generic) Every Other Day/15 Days Discharge Instructions: Apply over primary dressing as directed. Secured With: Child psychotherapist, Sterile 2x75 (in/in) (Generic) Every Other Day/15 Days Discharge Instructions: Secure with stretch gauze as directed. Secured With: Paper T ape, 1x10 (in/yd) (Generic) Every Other Day/15 Days Discharge Instructions: Secure dressing with tape as directed. #1 Berry dressing to silver alginate on both wound areas 2. I saw no evidence of infection 3. The patient does have PAD. He has follow-up arterial studies and a consult with vein and vascular on 08/12/2020 Electronic Signature(s) Signed: 07/14/2020 4:57:44 PM By: Linton Ham MD Signed: 07/14/2020 4:57:44 PM By: Linton Ham MD Entered By: Linton Ham on 07/14/2020 11:23:15 -------------------------------------------------------------------------------- SuperBill Details Patient Name: Date of Service: Micheal Quails D. 07/14/2020 Medical Record Number: [id number] Patient Account Number: [id number] Date of Birth/Sex: Treating RN: March 17, 1936 (85 y.o. [name] Primary Care Provider: [name] Other Clinician: Referring Provider: Treating Provider/Extender: Lynden Ang in Treatment: 14 Diagnosis Coding ICD-10 Codes Code Description [phone] Type 2  diabetes  mellitus with foot ulcer E11.51 Type 2 diabetes mellitus with diabetic peripheral angiopathy without gangrene L97.528 Non-pressure chronic ulcer of other part of left foot with other specified severity Facility Procedures CPT4 Code: [id number] Description: 85631 - DEB SUBQ TISSUE 20 SQ CM/< ICD-10 Diagnosis Description L97.528 Non-pressure chronic ulcer of other part of left foot with other specified seve Modifier: rity Quantity: 1 Physician Procedures : CPT4 Code Description Modifier [id number] - WC PHYS SUBQ TISS 20 SQ CM ICD-10 Diagnosis Description L97.528 Non-pressure chronic ulcer of other part of left foot with other specified severity Quantity: 1 Electronic Signature(s) Signed: 07/14/2020 4:57:44 PM By: Linton Ham MD Entered By: Linton Ham on 07/14/2020 11:23:27

## 2020-07-20 NOTE — Progress Notes (Signed)
Micheal, Walls ([id number]) Visit Report for 07/14/2020 Arrival Information Details Patient Name: Date of Service: Micheal, Walls 07/14/2020 9:45 A M Medical Record Number: [id number] Patient Account Number: [id number] Date of Birth/Sex: Treating RN: 1935-10-17 (85 y.o. [name], [name] Primary Care Tina Gruner: [name] Other Clinician: Referring Steadman Prosperi: Treating Caisley Baxendale/Extender: Lynden Ang in Treatment: 14 Visit Information History Since Last Visit Added or deleted any medications: No Patient Arrived: Wheel Chair Any new allergies or adverse reactions: No Arrival Time: 09:29 Had a fall or experienced change in No Accompanied By: self activities of daily living that may affect Transfer Assistance: Manual risk of falls: Patient Identification Verified: Yes Signs or symptoms of abuse/neglect since last visito No Secondary Verification Process Completed: Yes Hospitalized since last visit: No Patient Requires Transmission-Based Precautions: No Implantable device outside of the clinic excluding No Patient Has Alerts: No cellular tissue based products placed in the center since last visit: Has Dressing in Place as Prescribed: Yes Pain Present Now: No Electronic Signature(s) Signed: 07/14/2020 12:48:25 PM By: Sandre Kitty Entered By: Sandre Kitty on 07/14/2020 09:30:43 -------------------------------------------------------------------------------- Complex / Palliative Patient Assessment Details Patient Name: Date of Service: Micheal Quails D. 07/14/2020 9:45 A M Medical Record Number: [id number] Patient Account Number: [id number] Date of Birth/Sex: Treating RN: [birth year] (85 y.o. [name] Primary Care Kaliya Shreiner: [name] Other Clinician: Referring Baldo Hufnagle: Treating Shiesha Jahn/Extender: Lynden Ang in Treatment: 14 Palliative Management Criteria Complex Wound Management Criteria Patient has  remarkable or complex co-morbidities requiring medications or treatments that extend wound healing times. Examples: Diabetes mellitus with chronic renal failure or end stage renal disease requiring dialysis Advanced or poorly controlled rheumatoid arthritis Diabetes mellitus and end stage chronic obstructive pulmonary disease Active cancer with current chemo- or radiation therapy Type 2 Diabetes, PAD, CHF, CKD stg 4 Care Approach Wound Care Plan: Complex Wound Management Electronic Signature(s) Signed: 07/17/2020 4:22:00 PM By: Linton Ham MD Signed: 07/17/2020 4:51:44 PM By: Levan Hurst RN, BSN Entered By: Levan Hurst on 07/17/2020 09:58:54 -------------------------------------------------------------------------------- Encounter Discharge Information Details Patient Name: Date of Service: Micheal Quails D. 07/14/2020 9:45 A M Medical Record Number: [id number] Patient Account Number: [id number] Date of Birth/Sex: Treating RN: 1935-11-11 (85 y.o. [name] Primary Care Montrel Donahoe: [name] Other Clinician: Referring Kiyona Mcnall: Treating Raphaella Larkin/Extender: Lynden Ang in Treatment: 14 Encounter Discharge Information Items Post Procedure Vitals Discharge Condition: Stable Temperature (F): 97.6 Ambulatory Status: Wheelchair Pulse (bpm): 84 Discharge Destination: Home Respiratory Rate (breaths/min): 18 Transportation: Private Auto Blood Pressure (mmHg): 110/63 Accompanied By: alone Schedule Follow-up Appointment: Yes Clinical Summary of Care: Electronic Signature(s) Signed: 07/14/2020 5:18:27 PM By: Levan Hurst RN, BSN Entered By: Levan Hurst on 07/14/2020 11:34:01 -------------------------------------------------------------------------------- Lower Extremity Assessment Details Patient Name: Date of Service: Micheal Quails D. 07/14/2020 9:45 A M Medical Record Number: [id number] Patient Account Number: [id number] Date of  Birth/Sex: Treating RN: 1936-01-27 (85 y.o. [name] Primary Care Bralynn Donado: [name] Other Clinician: Referring Manraj Yeo: Treating Rayhan Groleau/Extender: Lynden Ang in Treatment: 14 Edema Assessment Assessed: Shirlyn Goltz: No] Patrice Paradise: No] Edema: [Left: Ye] [Right: s] Calf Left: Right: Point of Measurement: 40 cm From Medial Instep 38 cm Ankle Left: Right: Point of Measurement: 13 cm From Medial Instep 34 cm Vascular Assessment Pulses: Dorsalis Pedis Palpable: [Left:Yes] Electronic Signature(s) Signed: 07/14/2020 5:18:27 PM By: Levan Hurst RN, BSN Entered By: Levan Hurst on 07/14/2020 10:21:09 -------------------------------------------------------------------------------- Multi Wound Chart Details Patient Name: Date of Service: Micheal Walls, Micheal  ERETT D. 07/14/2020 9:45 A M Medical Record Number: [id number] Patient Account Number: [id number] Date of Birth/Sex: Treating RN: [birth year] (85 y.o. [name], [name] Primary Care Alexius Ellington: [name] Other Clinician: Referring Niajah Sipos: Treating Fred Hammes/Extender: Lynden Ang in Treatment: 14 Vital Signs Height(in): 42 Capillary Blood Glucose(mg/dl): 160 Weight(lbs): 275 Pulse(bpm): 53 Body Mass Index(BMI): 87 Blood Pressure(mmHg): 110/63 Temperature(F): 97.6 Respiratory Rate(breaths/min): 18 Photos: [1:No Photos Left T Great oe] [2:No Photos Left, Plantar T Great oe] [N/A:N/A N/A] Wound Location: [1:Blister] [2:Pressure Injury] [N/A:N/A] Wounding Event: [1:Diabetic Wound/Ulcer of the Lower] [2:Diabetic Wound/Ulcer of the Lower] [N/A:N/A] Primary Etiology: [1:Extremity Cataracts, Anemia, Lymphedema,] [2:Extremity Cataracts, Anemia, Lymphedema,] [N/A:N/A] Comorbid History: [1:Sleep Apnea, Congestive Heart Failure, Hypertension, Peripheral Arterial Disease, Peripheral Venous Arterial Disease, Peripheral Venous Disease, Type II Diabetes, End Stage Disease, Type II  Diabetes, End Stage Renal Disease, Gout,  Neuropathy 03/27/2020] [2:Sleep Apnea, Congestive Heart Failure, Hypertension, Peripheral Renal Disease, Gout, Neuropathy 07/14/2020] [N/A:N/A] Date Acquired: [1:14] [2:0] [N/A:N/A] Weeks of Treatment: [1:Open] [2:Open] [N/A:N/A] Wound Status: [1:0.3x0.3x0.3] [2:0.6x0.3x0.1] [N/A:N/A] Measurements L x W x D (cm) [1:0.071] [2:0.141] [N/A:N/A] A (cm) : rea [1:0.021] [2:0.014] [N/A:N/A] Volume (cm) : [1:-129.00%] [2:0.00%] [N/A:N/A] % Reduction in A [1:rea: -10.50%] [2:0.00%] [N/A:N/A] % Reduction in Volume: [1:12] Starting Position 1 (o'clock): [1:12] Ending Position 1 (o'clock): [1:0.2] Maximum Distance 1 (cm): [1:Yes] [2:No] [N/A:N/A] Undermining: [1:Grade 2] [2:Grade 2] [N/A:N/A] Classification: [1:Medium] [2:Medium] [N/A:N/A] Exudate A mount: [1:Serosanguineous] [2:Serosanguineous] [N/A:N/A] Exudate Type: [1:red, brown] [2:red, brown] [N/A:N/A] Exudate Color: [1:Well defined, not attached] [2:Flat and Intact] [N/A:N/A] Wound Margin: [1:Large (67-100%)] [2:Large (67-100%)] [N/A:N/A] Granulation A mount: [1:Pink, Pale] [2:Pink, Hyper-granulation] [N/A:N/A] Granulation Quality: [1:None Present (0%)] [2:None Present (0%)] [N/A:N/A] Necrotic A mount: [1:Fat Layer (Subcutaneous Tissue): Yes Fat Layer (Subcutaneous Tissue): Yes N/A] Exposed Structures: [1:Fascia: No Tendon: No Muscle: No Joint: No Bone: No Medium (34-66%)] [2:Fascia: No Tendon: No Muscle: No Joint: No Bone: No None] [N/A:N/A] Epithelialization: [1:Debridement - Excisional] [2:Debridement - Excisional] [N/A:N/A] Debridement: Pre-procedure Verification/Time Out 10:52 [2:10:53] [N/A:N/A] Taken: [1:Lidocaine] [2:Lidocaine] [N/A:N/A] Pain Control: [1:Callus, Subcutaneous, Slough] [2:Callus, Subcutaneous, Slough] [N/A:N/A] Tissue Debrided: [1:Skin/Subcutaneous Tissue] [2:Skin/Subcutaneous Tissue] [N/A:N/A] Level: [1:0.09] [2:0.18] [N/A:N/A] Debridement A (sq cm): [1:rea Curette]  [2:Curette] [N/A:N/A] Instrument: [1:Minimum] [2:Minimum] [N/A:N/A] Bleeding: [1:Pressure] [2:Pressure] [N/A:N/A] Hemostasis A chieved: [1:0] [2:0] [N/A:N/A] Procedural Pain: [1:0] [2:0] [N/A:N/A] Post Procedural Pain: [1:Procedure was tolerated well] [2:Procedure was tolerated well] [N/A:N/A] Debridement Treatment Response: [1:0.3x0.3x0.3] [2:0.6x0.3x0.1] [N/A:N/A] Post Debridement Measurements L x W x D (cm) [1:0.021] [2:0.014] [N/A:N/A] Post Debridement Volume: (cm) [1:Debridement] [2:Debridement] [N/A:N/A] Treatment Notes Electronic Signature(s) Signed: 07/14/2020 4:57:44 PM By: Linton Ham MD Signed: 07/20/2020 9:09:54 AM By: Rhae Hammock RN Entered By: Linton Ham on 07/14/2020 11:14:13 -------------------------------------------------------------------------------- Multi-Disciplinary Care Plan Details Patient Name: Date of Service: Micheal Quails D. 07/14/2020 9:45 A M Medical Record Number: [id number] Patient Account Number: [id number] Date of Birth/Sex: Treating RN: July 20, 1935 (85 y.o. [name], [name] Primary Care Aamina Skiff: [name] Other Clinician: Referring Faiga Stones: Treating Alaiza Yau/Extender: Lynden Ang in Treatment: 14 Active Inactive Wound/Skin Impairment Nursing Diagnoses: Impaired tissue integrity Knowledge deficit related to ulceration/compromised skin integrity Goals: Patient/caregiver will verbalize understanding of skin care regimen Date Initiated: 04/03/2020 Target Resolution Date: 08/14/2020 Goal Status: Active Ulcer/skin breakdown will have a volume reduction of 50% by week 8 Date Initiated: 04/03/2020 Date Inactivated: 05/15/2020 Target Resolution Date: 05/01/2020 Goal Status: Unmet Unmet Reason: offloading issue Ulcer/skin breakdown will have a volume reduction of 80% by week 12 Date Initiated: 05/15/2020 Target Resolution Date: 08/14/2020 Goal  Status: Active Interventions: Assess patient/caregiver  ability to obtain necessary supplies Assess patient/caregiver ability to perform ulcer/skin care regimen upon admission and as needed Assess ulceration(s) every visit Provide education on ulcer and skin care Treatment Activities: Skin care regimen initiated : 04/03/2020 Topical wound management initiated : 04/03/2020 Notes: Electronic Signature(s) Signed: 07/20/2020 9:09:54 AM By: Rhae Hammock RN Entered By: Rhae Hammock on 07/14/2020 11:02:57 -------------------------------------------------------------------------------- Pain Assessment Details Patient Name: Date of Service: Micheal Quails D. 07/14/2020 9:45 A M Medical Record Number: [id number] Patient Account Number: [id number] Date of Birth/Sex: Treating RN: 18-Sep-1935 (85 y.o. [name], [name] Primary Care Wisam Siefring: [name] Other Clinician: Referring Maryum Batterson: Treating Shepherd Finnan/Extender: Lynden Ang in Treatment: 14 Active Problems Location of Pain Severity and Description of Pain Patient Has Paino No Site Locations Pain Management and Medication Current Pain Management: Electronic Signature(s) Signed: 07/14/2020 12:48:25 PM By: Sandre Kitty Signed: 07/20/2020 9:09:54 AM By: Rhae Hammock RN Entered By: Sandre Kitty on 07/14/2020 09:31:10 -------------------------------------------------------------------------------- Patient/Caregiver Education Details Patient Name: Date of Service: Micheal Walls 2/1/2022andnbsp9:45 A M Medical Record Number: [id number] Patient Account Number: [id number] Date of Birth/Gender: Treating RN: [birth year] (85 y.o. [name] Primary Care Physician: [name] Other Clinician: Referring Physician: Treating Physician/Extender: Lynden Ang in Treatment: 14 Education Assessment Education Provided To: Patient Education Topics Provided Basic Hygiene: Methods: Explain/Verbal Responses: State  content correctly Electronic Signature(s) Signed: 07/20/2020 9:09:54 AM By: Rhae Hammock RN Entered By: Rhae Hammock on 07/14/2020 11:03:11 -------------------------------------------------------------------------------- Wound Assessment Details Patient Name: Date of Service: Micheal Quails D. 07/14/2020 9:45 A M Medical Record Number: [id number] Patient Account Number: [id number] Date of Birth/Sex: Treating RN: 11-May-1936 (85 y.o. [name], [place] Primary Care Starletta Houchin: [name] Other Clinician: Referring Tylesha Gibeault: Treating Ilea Hilton/Extender: Lynden Ang in Treatment: 14 Wound Status Wound Number: 1 Primary Diabetic Wound/Ulcer of the Lower Extremity Etiology: Wound Location: Left T Great oe Wound Open Wounding Event: Blister Status: Date Acquired: 03/27/2020 Comorbid Cataracts, Anemia, Lymphedema, Sleep Apnea, Congestive Heart Weeks Of Treatment: 14 History: Failure, Hypertension, Peripheral Arterial Disease, Peripheral Venous Clustered Wound: No Disease, Type II Diabetes, End Stage Renal Disease, Gout, Neuropathy Photos Photo Uploaded By: Mikeal Hawthorne on 07/17/2020 13:49:04 Wound Measurements Length: (cm) 0.3 Width: (cm) 0.3 Depth: (cm) 0.3 Area: (cm) 0.071 Volume: (cm) 0.021 % Reduction in Area: -129% % Reduction in Volume: -10.5% Epithelialization: Medium (34-66%) Tunneling: No Undermining: Yes Starting Position (o'clock): 12 Ending Position (o'clock): 12 Maximum Distance: (cm) 0.2 Wound Description Classification: Grade 2 Wound Margin: Well defined, not attached Exudate Amount: Medium Exudate Type: Serosanguineous Exudate Color: red, brown Foul Odor After Cleansing: No Slough/Fibrino Yes Wound Bed Granulation Amount: Large (67-100%) Exposed Structure Granulation Quality: Pink, Pale Fascia Exposed: No Necrotic Amount: None Present (0%) Fat Layer (Subcutaneous Tissue) Exposed: Yes Tendon Exposed: No Muscle  Exposed: No Joint Exposed: No Bone Exposed: No Treatment Notes Wound #1 (Toe Great) Wound Laterality: Left Cleanser Normal Saline Discharge Instruction: Cleanse the wound with Normal Saline prior to applying a clean dressing using gauze sponges, not tissue or cotton balls. Soap and Water Discharge Instruction: May shower and wash wound with dial antibacterial soap and water prior to dressing change. Peri-Wound Care Topical Primary Dressing KerraCel Ag Gelling Fiber Dressing, 4x5 in (silver alginate) Discharge Instruction: Apply silver alginate to wound bed as instructed Secondary Dressing Woven Gauze Sponge, Non-Sterile 4x4 in Discharge Instruction: Apply over primary dressing as directed. Woven Gauze Sponges 2x2 in Discharge Instruction: Apply over primary  dressing as directed. Secured With Conforming Stretch Gauze Bandage, Sterile 2x75 (in/in) Discharge Instruction: Secure with stretch gauze as directed. Paper Tape, 1x10 (in/yd) Discharge Instruction: Secure dressing with tape as directed. Compression Wrap Compression Stockings Add-Ons Electronic Signature(s) Signed: 07/14/2020 5:18:27 PM By: Levan Hurst RN, BSN Signed: 07/20/2020 9:09:54 AM By: Rhae Hammock RN Entered By: Levan Hurst on 07/14/2020 10:19:25 -------------------------------------------------------------------------------- Wound Assessment Details Patient Name: Date of Service: Micheal Quails D. 07/14/2020 9:45 A M Medical Record Number: [id number] Patient Account Number: [id number] Date of Birth/Sex: Treating RN: [birth year] (85 y.o. [name], [name] Primary Care Adell Panek: [name] Other Clinician: Referring Arnold Kester: Treating Dashanna Kinnamon/Extender: Lynden Ang in Treatment: 14 Wound Status Wound Number: 2 Primary Diabetic Wound/Ulcer of the Lower Extremity Etiology: Wound Location: Left, Plantar T Great oe Wound Open Wounding Event: Pressure Injury Status: Date  Acquired: 07/14/2020 Comorbid Cataracts, Anemia, Lymphedema, Sleep Apnea, Congestive Heart Weeks Of Treatment: 0 History: Failure, Hypertension, Peripheral Arterial Disease, Peripheral Venous Clustered Wound: No Disease, Type II Diabetes, End Stage Renal Disease, Gout, Neuropathy Wound Measurements Length: (cm) 0.6 Width: (cm) 0.3 Depth: (cm) 0.1 Area: (cm) 0.141 Volume: (cm) 0.014 % Reduction in Area: 0% % Reduction in Volume: 0% Epithelialization: None Tunneling: No Undermining: No Wound Description Classification: Grade 2 Wound Margin: Flat and Intact Exudate Amount: Medium Exudate Type: Serosanguineous Exudate Color: red, brown Foul Odor After Cleansing: No Slough/Fibrino No Wound Bed Granulation Amount: Large (67-100%) Exposed Structure Granulation Quality: Pink, Hyper-granulation Fascia Exposed: No Necrotic Amount: None Present (0%) Fat Layer (Subcutaneous Tissue) Exposed: Yes Tendon Exposed: No Muscle Exposed: No Joint Exposed: No Bone Exposed: No Treatment Notes Wound #2 (Toe Great) Wound Laterality: Plantar, Left Cleanser Normal Saline Discharge Instruction: Cleanse the wound with Normal Saline prior to applying a clean dressing using gauze sponges, not tissue or cotton balls. Soap and Water Discharge Instruction: May shower and wash wound with dial antibacterial soap and water prior to dressing change. Peri-Wound Care Topical Primary Dressing KerraCel Ag Gelling Fiber Dressing, 4x5 in (silver alginate) Discharge Instruction: Apply silver alginate to wound bed as instructed Secondary Dressing Woven Gauze Sponge, Non-Sterile 4x4 in Discharge Instruction: Apply over primary dressing as directed. Woven Gauze Sponges 2x2 in Discharge Instruction: Apply over primary dressing as directed. Secured With Conforming Stretch Gauze Bandage, Sterile 2x75 (in/in) Discharge Instruction: Secure with stretch gauze as directed. Paper Tape, 1x10 (in/yd) Discharge  Instruction: Secure dressing with tape as directed. Compression Wrap Compression Stockings Add-Ons Electronic Signature(s) Signed: 07/14/2020 5:18:27 PM By: Levan Hurst RN, BSN Signed: 07/20/2020 9:09:54 AM By: Rhae Hammock RN Entered By: Levan Hurst on 07/14/2020 10:20:19 -------------------------------------------------------------------------------- Vitals Details Patient Name: Date of Service: Micheal Quails D. 07/14/2020 9:45 A M Medical Record Number: [id number] Patient Account Number: [id number] Date of Birth/Sex: Treating RN: 04/14/1936 (85 y.o. [name], [name] Primary Care Riggin Cuttino: [name] Other Clinician: Referring Micaela Stith: Treating Ryleah Miramontes/Extender: Lynden Ang in Treatment: 14 Vital Signs Time Taken: 09:30 Temperature (F): 97.6 Height (in): 73 Pulse (bpm): 84 Weight (lbs): 275 Respiratory Rate (breaths/min): 18 Body Mass Index (BMI): 36.3 Blood Pressure (mmHg): 110/63 Capillary Blood Glucose (mg/dl): 160 Reference Range: 80 - 120 mg / dl Electronic Signature(s) Signed: 07/14/2020 12:48:25 PM By: Sandre Kitty Entered By: Sandre Kitty on 07/14/2020 09:31:05

## 2020-07-21 ENCOUNTER — Ambulatory Visit (HOSPITAL_BASED_OUTPATIENT_CLINIC_OR_DEPARTMENT_OTHER): Payer: Medicare Other | Admitting: Internal Medicine

## 2020-07-21 DIAGNOSIS — E113553 Type 2 diabetes mellitus with stable proliferative diabetic retinopathy, bilateral: Secondary | ICD-10-CM

## 2020-07-21 DIAGNOSIS — R6520 Severe sepsis without septic shock: Secondary | ICD-10-CM

## 2020-07-21 DIAGNOSIS — E11628 Type 2 diabetes mellitus with other skin complications: Secondary | ICD-10-CM

## 2020-07-21 DIAGNOSIS — N184 Chronic kidney disease, stage 4 (severe): Secondary | ICD-10-CM

## 2020-07-21 DIAGNOSIS — N179 Acute kidney failure, unspecified: Secondary | ICD-10-CM

## 2020-07-21 DIAGNOSIS — E11621 Type 2 diabetes mellitus with foot ulcer: Secondary | ICD-10-CM

## 2020-07-21 DIAGNOSIS — A419 Sepsis, unspecified organism: Principal | ICD-10-CM

## 2020-07-21 DIAGNOSIS — L97529 Non-pressure chronic ulcer of other part of left foot with unspecified severity: Secondary | ICD-10-CM

## 2020-07-21 DIAGNOSIS — Z794 Long term (current) use of insulin: Secondary | ICD-10-CM

## 2020-07-21 DIAGNOSIS — L089 Local infection of the skin and subcutaneous tissue, unspecified: Secondary | ICD-10-CM

## 2020-07-21 DIAGNOSIS — L0889 Other specified local infections of the skin and subcutaneous tissue: Secondary | ICD-10-CM

## 2020-07-21 LAB — COMPREHENSIVE METABOLIC PANEL
ALT: 16 U/L (ref 0–44)
AST: 35 U/L (ref 15–41)
Albumin: 2.6 g/dL — ABNORMAL LOW (ref 3.5–5.0)
Alkaline Phosphatase: 39 U/L (ref 38–126)
Anion gap: 9 (ref 5–15)
BUN: 47 mg/dL — ABNORMAL HIGH (ref 8–23)
CO2: 22 mmol/L (ref 22–32)
Calcium: 9.7 mg/dL (ref 8.9–10.3)
Chloride: 111 mmol/L (ref 98–111)
Creatinine, Ser: 1.88 mg/dL — ABNORMAL HIGH (ref 0.61–1.24)
GFR, Estimated: 35 mL/min — ABNORMAL LOW (ref >60)
Glucose, Bld: 119 mg/dL — ABNORMAL HIGH (ref 70–99)
Potassium: 4.2 mmol/L (ref 3.5–5.1)
Sodium: 142 mmol/L (ref 135–145)
Total Bilirubin: 0.8 mg/dL (ref 0.3–1.2)
Total Protein: 5.8 g/dL — ABNORMAL LOW (ref 6.5–8.1)

## 2020-07-21 LAB — GLUCOSE, CAPILLARY
Glucose-Capillary: 156 mg/dL — ABNORMAL HIGH (ref 70–99)
Glucose-Capillary: 145 mg/dL — ABNORMAL HIGH (ref 70–99)
Glucose-Capillary: 154 mg/dL — ABNORMAL HIGH (ref 70–99)
Glucose-Capillary: 166 mg/dL — ABNORMAL HIGH (ref 70–99)

## 2020-07-21 LAB — CBC
HCT: 31 % — ABNORMAL LOW (ref 39.0–52.0)
Hemoglobin: 10 g/dL — ABNORMAL LOW (ref 13.0–17.0)
MCH: 30.4 pg (ref 26.0–34.0)
MCHC: 32.3 g/dL (ref 30.0–36.0)
MCV: 94.2 fL (ref 80.0–100.0)
Platelets: 123 10*3/uL — ABNORMAL LOW (ref 150–400)
RBC: 3.29 MIL/uL — ABNORMAL LOW (ref 4.22–5.81)
RDW: 17.2 % — ABNORMAL HIGH (ref 11.5–15.5)
WBC: 6.7 10*3/uL (ref 4.0–10.5)
nRBC: 0 % (ref 0.0–0.2)

## 2020-07-21 LAB — PHOSPHORUS: Phosphorus: 2.6 mg/dL (ref 2.5–4.6)

## 2020-07-21 LAB — MAGNESIUM: Magnesium: 2.2 mg/dL (ref 1.7–2.4)

## 2020-07-21 MED ORDER — AMOXICILLIN-POT CLAVULANATE 875-125 MG PO TABS
1.0000 | ORAL_TABLET | Freq: Two times a day (BID) | ORAL | Status: DC
  Administered 2020-07-21 – 2020-07-22 (×3): 1 via ORAL
  Filled 2020-07-21 (×3): qty 1

## 2020-07-21 MED ORDER — DOXYCYCLINE HYCLATE 100 MG PO TABS
100.0000 mg | ORAL_TABLET | Freq: Two times a day (BID) | ORAL | Status: DC
Start: 2020-07-21 — End: 2020-07-22
  Administered 2020-07-21 – 2020-07-22 (×3): 100 mg via ORAL
  Filled 2020-07-21 (×3): qty 1

## 2020-07-21 NOTE — Consult Note (Signed)
Podiatry  Progress note  Subjective:  Micheal Walls is a 85 y.o. male patient seen this AM for toe check and for discussion of MRI results. Patient denies any overnight events.  Denies pain to the toe, denies nausea, vomiting, fever, chills or any other constitutional symptoms.   Patient Active Problem List   Diagnosis Date Noted  . Cerebral thrombosis with cerebral infarction 07/20/2020  . Sepsis ([place]) 07/19/2020  . Thrombocytopenia ([place]) 07/19/2020  . Cerebral ischemia 07/18/2020  . Bilateral hearing loss 05/20/2020  . Does use hearing aid 05/20/2020  . Diabetic ulcer of left great toe ([place]) 01/16/2020  . Controlled type 2 diabetes mellitus with stable proliferative retinopathy of both eyes, with long-term current use of insulin ([place]) 01/06/2020  . Intermediate stage nonexudative age-related macular degeneration of both eyes 01/06/2020  . Left epiretinal membrane 01/06/2020  . Drusen of right macula 01/06/2020  . AKI (acute kidney injury) ([place]) 11/02/2019  . Cellulitis 11/02/2019  . Elevated TSH 07/10/2018  . Nephropathy 02/28/2018  . Disturbance of skin sensation 02/28/2018  . CKD (chronic kidney disease) stage 4, GFR 15-29 ml/min (HCC) 07/17/2017  . PAD (peripheral artery disease) ([place]) 07/17/2017  . 110.1 10/21/2013  . Epiphora 04/24/2013  . Laxity of eyelid 04/24/2013  . Anemia 04/09/2013  . History of peripheral vascular disease 04/09/2013  . History of stroke 04/09/2013  . S/P unilateral BKA (below knee amputation) ([place]) 03/17/2013  . Diabetes mellitus type 2 in obese ([place]) 03/17/2013  . Mixed hyperlipidemia 03/17/2013  . Essential hypertension 03/17/2013  . Gout 03/17/2013  . CKD (chronic kidney disease), stage III ([place]) 03/17/2013  . OSA on CPAP 03/17/2013  . Anemia, iron deficiency 03/17/2013  . Diverticulosis of colon 03/17/2013  . RBBB 03/17/2013  . Punctal stenosis, acquired 10/02/2012     Current Facility-Administered Medications:  .   stroke: mapping  our early stages of recovery book, , Does not apply, Once, Clarnce Flock, MD .  (feeding supplement) PROSource Plus liquid 30 mL, 30 mL, Oral, TID with meals, Shawna Clamp, MD, 30 mL at 07/20/20 1847 .  0.9 %  sodium chloride infusion, , Intravenous, Continuous, Clarnce Flock, MD, Last Rate: 50 mL/hr at 07/20/20 2123, New Bag at 07/20/20 2123 .  acetaminophen (TYLENOL) tablet 650 mg, 650 mg, Oral, Q4H PRN **OR** acetaminophen (TYLENOL) 160 MG/5ML solution 650 mg, 650 mg, Per Tube, Q4H PRN **OR** acetaminophen (TYLENOL) suppository 650 mg, 650 mg, Rectal, Q4H PRN, Clarnce Flock, MD .  aspirin EC tablet 81 mg, 81 mg, Oral, Daily, Clarnce Flock, MD, 81 mg at 07/20/20 0854 .  cefTRIAXone (ROCEPHIN) 2 g in sodium chloride 0.9 % 100 mL IVPB, 2 g, Intravenous, Q24H, Samuella Cota, MD, Last Rate: 200 mL/hr at 07/20/20 2126, 2 g at 07/20/20 2126 .  clopidogrel (PLAVIX) tablet 75 mg, 75 mg, Oral, Daily, Samuella Cota, MD, 75 mg at 07/20/20 0859 .  diltiazem (CARDIZEM CD) 24 hr capsule 300 mg, 300 mg, Oral, Daily, Clarnce Flock, MD, 300 mg at 07/20/20 0855 .  dorzolamide (TRUSOPT) 2 % ophthalmic solution 1 drop, 1 drop, Both Eyes, BID, Clarnce Flock, MD, 1 drop at 07/20/20 2127 .  enoxaparin (LOVENOX) injection 40 mg, 40 mg, Subcutaneous, Daily, Samuella Cota, MD, 40 mg at 07/20/20 0857 .  insulin aspart (novoLOG) injection 0-6 Units, 0-6 Units, Subcutaneous, TID WC, Clarnce Flock, MD, 1 Units at 07/21/20 [phone] .  latanoprost (XALATAN) 0.005 % ophthalmic solution 1 drop, 1  drop, Both Eyes, QHS, Clarnce Flock, MD, 1 drop at 07/20/20 2127 .  metroNIDAZOLE (FLAGYL) tablet 500 mg, 500 mg, Oral, Q8H, Pham, Minh Q, RPH-CPP, 500 mg at 07/21/20 0601 .  multivitamin with minerals tablet 1 tablet, 1 tablet, Oral, Daily, Clarnce Flock, MD, 1 tablet at 07/20/20 [phone] .  omega-3 acid ethyl esters (LOVAZA) capsule 2 g, 2 g, Oral, Daily, Clarnce Flock, MD, 2 g at  07/20/20 0854 .  pantoprazole (PROTONIX) EC tablet 40 mg, 40 mg, Oral, QHS, Clarnce Flock, MD, 40 mg at 07/20/20 2127 .  protein supplement (ENSURE MAX) liquid, 11 oz, Oral, TID BM, Shawna Clamp, MD, 11 oz at 07/20/20 2016 .  rosuvastatin (CRESTOR) tablet 10 mg, 10 mg, Oral, Q M,W,F, Clarnce Flock, MD, 10 mg at 07/20/20 3154  Allergies  Allergen Reactions  . Vancomycin Rash     Objective: Today's Vitals   07/20/20 1533 07/20/20 1926 07/20/20 2320 07/21/20 0333  BP: (!) 122/59 (!) 108/54 (!) 113/47 (!) 133/59  Pulse: 88 93 89 86  Resp: $Remo'16 17 20 17  'PcSDg$ Temp: 98.1 F (36.7 C) 98 F (36.7 C) 98.5 F (36.9 C) 98.3 F (36.8 C)  TempSrc: Oral Oral Oral Oral  SpO2: 96% 97% 97% 100%  Weight:      Height:      PainSc:  0-No pain      General: No acute distress  Left Lower extremity: Great toe there is a dry pinpoint fibrogranular ulceration noted at the dorsal hallux medial IPJ that measures less than 0.2 cm no active drainage and dry blood blister to tip of hallux, no active drainage, no significant redness, or warmth.  DP pedal pulse on left 1 out of 4, PT pedal pulses on left 0 out of 4, 1+ pitting edema difficult to palpate pulse unchanged prior.  No pain to palpation to left foot or calf.  Pes planus foot type.    Status post right lower extremity amputation.    MRI Left foot IMPRESSION: 1. Abnormal marrow edema in the distal and proximal phalanges of the great toe centered at the interphalangeal joint. The patient has a known new medial erosion of the head of the proximal phalanx of the great toe. Gout or osteomyelitis could cause this imaging appearance; presence of the wound along the great toe tends to favor infection, and if there is drainage then the likelihood of infection increases further. 2. Subcutaneous edema in the forefoot, especially dorsally, extending into the toes. No visible drainable abscess. 3. Low-grade edema signal along the plantar musculature  of the foot, probably neurogenic.    Assessment and Plan:  Problem List Items Addressed This Visit      Genitourinary   AKI (acute kidney injury) ([place]) - Primary     Other   * (Principal) Sepsis ([place])    Other Visit Diagnoses    Vertebral artery stenosis, unspecified laterality       Relevant Medications   aspirin EC 81 MG tablet   aspirin chewable tablet 81 mg (Completed)   aspirin EC tablet 81 mg   diltiazem (CARDIZEM CD) 24 hr capsule 300 mg   rosuvastatin (CRESTOR) tablet 10 mg   omega-3 acid ethyl esters (LOVAZA) capsule 2 g   enoxaparin (LOVENOX) injection 40 mg   Cerebrovascular accident (CVA), unspecified mechanism (HCC)       Relevant Medications   aspirin EC 81 MG tablet   aspirin chewable tablet 81 mg (Completed)   aspirin EC  tablet 81 mg   diltiazem (CARDIZEM CD) 24 hr capsule 300 mg   rosuvastatin (CRESTOR) tablet 10 mg   omega-3 acid ethyl esters (LOVAZA) capsule 2 g   enoxaparin (LOVENOX) injection 40 mg   Wound cellulitis       Relevant Orders   DG Toe Great Left (Completed)      -Patient seen and evaluated at bedside -Wound check performed; no active drainage unable to obtain sufficient wound culture swab due to dry nature of wound -MRI results reviewed with patient  -Do not recommend surgery at this time due to risks. Recommend continue with empiric antibiotic therapy long term 4 weeks; WBC down trending  -Continue with local wound care of Betadine and dry dressing to first toe on left -Continue with gout management  -Recommend continue with PT OT and weightbearing as tolerated with a surgical shoe that does not rub great toe on the left -No podiatric surgical needs at this time; podiatry to sign off; re-consult if new issues arise or if toe worsens.  Landis Martins, DPM [facility] [phone] office [phone] cell

## 2020-07-21 NOTE — Progress Notes (Signed)
Physical Therapy Treatment Patient Details Name: Micheal Walls MRN: [id number] DOB: 08/01/1935 Today's Date: 07/21/2020    History of Present Illness 85 y.o. male presenting with generalized weakness. Imaging suspicious for R sided cerebral infarct of unclear age. MRI (+) remote large bilateral cerebellar infarcts anda  remote pontine infarct. Patient also found to have sepsis of unclear source and AKI. PMHx significant for diastolic CHF, DM, HTN, R BKA, L great toe diabetic ulcer, and anemia.    PT Comments    Pt mobilizing well today with new bariatric RW in room. Min A to stand from elevated surface. He is unable to stand from surface where his hips are at or below his knees but he is aware of this and has adjusted his seating at home accordingly. Pt able to ambulate 3' to chair with RW and min A. This is close to baseline for him. Continue to recommend HHPT at d/c. PT will continue to follow.    Follow Up Recommendations  Home health PT     Equipment Recommendations  Rolling walker with 5" wheels;Other (comment) (bariatric)    Recommendations for Other Services       Precautions / Restrictions Precautions Precautions: Fall Precaution Comments: R BKA with prosthesis, h/o old R CVA with mild L weakness Required Braces or Orthoses: Other Brace Other Brace: pt reports he came in with surgical shoe on L foot. Shoe is not in his belongings bag. RN notified. He will need a new one if lost in ED Restrictions Weight Bearing Restrictions: No Other Position/Activity Restrictions: WBAT per podiatrist    Mobility  Bed Mobility Overal bed mobility: Modified Independent                Transfers Overall transfer level: Needs assistance Equipment used: Rolling walker (2 wheeled) Transfers: Sit to/from Stand Sit to Stand: Min assist Stand pivot transfers: Min assist       General transfer comment: pt has difficulty rising from low surfaces. With bed elevated, can rise to RW  with min A and increased time. At home he has stable surfaces that he pulls on such as his dresser. Pt able to step feet bed to recliner with min A and vc's for proper use of RW  Ambulation/Gait Ambulation/Gait assistance: Min assist Gait Distance (Feet): 3 Feet Assistive device: Rolling walker (2 wheeled) Gait Pattern/deviations: Step-to pattern     General Gait Details: has difficulty stepping L foot   Stairs             Wheelchair Mobility    Modified Rankin (Stroke Patients Only) Modified Rankin (Stroke Patients Only) Pre-Morbid Rankin Score: Moderately severe disability Modified Rankin: Moderately severe disability     Balance Overall balance assessment: Needs assistance Sitting-balance support: Feet supported;No upper extremity supported Sitting balance-Leahy Scale: Good Sitting balance - Comments: pt able to maintain balance while donning RLE prosthesis   Standing balance support: Bilateral upper extremity supported Standing balance-Leahy Scale: Poor Standing balance comment: heavily reliant on UE's for support                            Cognition Arousal/Alertness: Awake/alert Behavior During Therapy: WFL for tasks assessed/performed Overall Cognitive Status: Within Functional Limits for tasks assessed  Exercises      General Comments General comments (skin integrity, edema, etc.): VSS. Post op shoe ordered with RN after session per note from podiatrist      Pertinent Vitals/Pain Pain Assessment: No/denies pain    Home Living                      Prior Function            PT Goals (current goals can now be found in the care plan section) Acute Rehab PT Goals Patient Stated Goal: return home PT Goal Formulation: With patient Time For Goal Achievement: 08/03/20 Potential to Achieve Goals: Good Progress towards PT goals: Progressing toward goals    Frequency    Min  4X/week      PT Plan Current plan remains appropriate    Co-evaluation              AM-PAC PT "6 Clicks" Mobility   Outcome Measure  Help needed turning from your back to your side while in a flat bed without using bedrails?: None Help needed moving from lying on your back to sitting on the side of a flat bed without using bedrails?: None Help needed moving to and from a bed to a chair (including a wheelchair)?: A Little Help needed standing up from a chair using your arms (e.g., wheelchair or bedside chair)?: A Little Help needed to walk in hospital room?: A Lot Help needed climbing 3-5 steps with a railing? : Total 6 Click Score: 17    End of Session Equipment Utilized During Treatment: Gait belt Activity Tolerance: Patient tolerated treatment well Patient left: in chair;with call bell/phone within reach;with chair alarm set Nurse Communication: Mobility status PT Visit Diagnosis: Muscle weakness (generalized) (M62.81);Unsteadiness on feet (R26.81)     Time: 4492-0100 PT Time Calculation (min) (ACUTE ONLY): 29 min  Charges:  $Gait Training: 8-22 mins $Therapeutic Activity: 8-22 mins                     Leighton Roach, PT  Acute Rehab Services  Pager [phone] Office [place] 07/21/2020, 3:30 PM

## 2020-07-21 NOTE — Progress Notes (Signed)
Orthopedic Tech Progress Note Patient Details:  Micheal Walls [birth year] [id number]  Ortho Devices Type of Ortho Device: Postop shoe/boot Ortho Device/Splint Location: LLE Ortho Device/Splint Interventions: Application,Adjustment,Ordered   Post Interventions Patient Tolerated: Well Instructions Provided: Care of [place] 07/21/2020, 3:42 PM

## 2020-07-21 NOTE — Care Management (Signed)
Due to patients body habitus the patient will need a wide walker. He would be unsafe with a normal width walker.

## 2020-07-21 NOTE — Progress Notes (Addendum)
PROGRESS NOTE    Micheal Walls  DDU:[id number] DOB: 19-Feb-1936 DOA: 07/18/2020 PCP: [name], [name]   Brief Narrative:  This 85 year old man with PMH diabetes mellitus with peripheral vascular disease status post Right BKA, diastolic CHF presented to emergency department after waking up feeling weak.  Admitted for SIRS, suspected cerebral infarct, right vertebral artery stenosis, acute kidney injury.  Patient is transferred from [facility] for stroke work-up.  Neurology recommended aspirin and Plavix for 3 weeks followed by aspirin alone.  Patient is found to have a wound on left great toe,  MRI Left foot consistent with Infection.  Podiatry consulted states patient is not a surgical candidate given recent stroke and other comorbid conditions,  recommended 4 weeks of empiric antibiotics.  Infectious disease consulted for antibiotics regimen.  Assessment & Plan:   Principal Problem:   Sepsis ([place]) Active Problems:   S/P unilateral BKA (below knee amputation) (HCC)   Essential hypertension   Gout   CKD (chronic kidney disease), stage III (HCC)   RBBB   CKD (chronic kidney disease) stage 4, GFR 15-29 ml/min (HCC)   PAD (peripheral artery disease) (HCC)   AKI (acute kidney injury) ([place])   Controlled type 2 diabetes mellitus with stable proliferative retinopathy of both eyes, with long-term current use of insulin (HCC)   Diabetic ulcer of left great toe (HCC)   Cerebral ischemia   Thrombocytopenia (HCC)   Cerebral thrombosis with cerebral infarction  Subacute right frontal stroke, severe stenosis versus occlusion right distal intradural vertebral artery.   Patient presented with generalized weakness, is found to have subacute right frontal stroke on MRI. Thought secondary to poorly controlled diabetes, hypertension - TTE : LVEF 50-55%, No intracardiac source of emboli found. -Per nursing cleared swallow screen.  Resumed diet. -Neurology recommended aspirin and Plavix for 3 weeks  then aspirin 81 mg daily alone, Continue statin, DVT prophylaxis.  Sepsis secondary to diabetic left foot infection: -Ulcer on distal tip of left toe appears to be infected.   Foot otherwise appears uninvolved.  Perfusion appears intact.   Lower extremity wound protocol initiated.   ABI unremarkable. -Narrow antibiotics.  Wound care consult. - MRI of left foot ordered, consistent with infection. -Podiatry consulted, states poor surgical candidate given recent stroke and peripheral artery disease. -Recommended empirical antibiotics for 4 weeks. -Infectious disease consulted for antibiotics regimen.  AKI superimposed on CKD, likely stage IIIb >> > Improving --Gentle IV fluids.  Hold diuretic.  Repeat BMP in a.m.  Thrombocytopenia, seen also May 2021. --Etiology unclear.  Could be connected to sepsis or may be chronic. --Trend CBC.  Diabetes mellitus type 2 with peripheral vascular disease, status post right BKA, peripheral neuropathy --Hold gabapentin in setting of acute kidney injury.  Hold semaglutide. --Sliding scale insulin.  Gout --Allopurinol on hold in the setting of AKI.   Essential hypertension.   BP meds on hold, permissive hypertension.  Glaucoma --Continue eye gtts   DVT prophylaxis:  Lovenox Code Status: Full code Family Communication: No family at bed side. Disposition Plan:   Status is: Inpatient  Remains inpatient appropriate because:Inpatient level of care appropriate due to severity of illness   Dispo: The patient is from: Home              Anticipated d/c is to: TBD              Anticipated d/c date is: 3 days              Patient  currently is not medically stable to d/c.   Difficult to place patient No   Consultants:   Neurology  Podiatry  Procedures:  Antimicrobials:  Anti-infectives (From admission, onward)   Start     Dose/Rate Route Frequency Ordered Stop   07/20/20 1015  metroNIDAZOLE (FLAGYL) tablet 500 mg        500 mg Oral  Every 8 hours 07/20/20 0921     07/19/20 2200  cefTRIAXone (ROCEPHIN) 2 g in sodium chloride 0.9 % 100 mL IVPB        2 g 200 mL/hr over 30 Minutes Intravenous Every 24 hours 07/19/20 1441     07/19/20 1400  ceFEPIme (MAXIPIME) 1 g in sodium chloride 0.9 % 100 mL IVPB  Status:  Discontinued        1 g 200 mL/hr over 30 Minutes Intravenous Every 24 hours 07/18/20 2353 07/18/20 2356   07/19/20 1400  ceFEPIme (MAXIPIME) 2 g in sodium chloride 0.9 % 100 mL IVPB  Status:  Discontinued        2 g 200 mL/hr over 30 Minutes Intravenous Every 24 hours 07/18/20 2356 07/19/20 1441   07/19/20 0500  linezolid (ZYVOX) IVPB 600 mg  Status:  Discontinued        600 mg 300 mL/hr over 60 Minutes Intravenous Every 12 hours 07/18/20 2318 07/19/20 1441   07/19/20 0000  metroNIDAZOLE (FLAGYL) IVPB 500 mg  Status:  Discontinued        500 mg 100 mL/hr over 60 Minutes Intravenous Every 8 hours 07/18/20 2318 07/20/20 0921   07/18/20 1545  linezolid (ZYVOX) IVPB 600 mg        600 mg 300 mL/hr over 60 Minutes Intravenous  Once 07/18/20 1535 07/18/20 1934   07/18/20 1400  vancomycin (VANCOREADY) IVPB 2000 mg/400 mL  Status:  Discontinued        2,000 mg 200 mL/hr over 120 Minutes Intravenous  Once 07/18/20 1343 07/18/20 1423   07/18/20 1330  ceFEPIme (MAXIPIME) 2 g in sodium chloride 0.9 % 100 mL IVPB        2 g 200 mL/hr over 30 Minutes Intravenous  Once 07/18/20 1325 07/18/20 1445   07/18/20 1330  metroNIDAZOLE (FLAGYL) IVPB 500 mg        500 mg 100 mL/hr over 60 Minutes Intravenous  Once 07/18/20 1325 07/18/20 1522   07/18/20 1330  vancomycin (VANCOCIN) IVPB 1000 mg/200 mL premix  Status:  Discontinued        1,000 mg 200 mL/hr over 60 Minutes Intravenous  Once 07/18/20 1325 07/18/20 1340      Subjective: Patient was seen and examined at bedside.  Overnight events noted.   Patient was alert and oriented x 3,  Patient is s/p right BKA.  He reports feeling better denies any pain.  States he slept good  throughout night.  Objective: Vitals:   07/20/20 1926 07/20/20 2320 07/21/20 0333 07/21/20 0745  BP: (!) 108/54 (!) 113/47 (!) 133/59 138/62  Pulse: 93 89 86 83  Resp: $Remo'17 20 17 'wqvPp$ (!) 24  Temp: 98 F (36.7 C) 98.5 F (36.9 C) 98.3 F (36.8 C) 98.6 F (37 C)  TempSrc: Oral Oral Oral Oral  SpO2: 97% 97% 100% 98%  Weight:      Height:        Intake/Output Summary (Last 24 hours) at 07/21/2020 1131 Last data filed at 07/21/2020 0300 Gross per 24 hour  Intake 2375.35 ml  Output 650 ml  Net 1725.35 ml  Filed Weights   07/18/20 1417  Weight: 124.7 kg    Examination:  General exam: Appears calm and comfortable, not in any acute distress. Respiratory system: Clear to auscultation. Respiratory effort normal. Cardiovascular system: S1 & S2 heard, RRR. No JVD, murmurs, rubs, gallops or clicks. No pedal edema. Gastrointestinal system: Abdomen is nondistended, soft and nontender. No organomegaly or masses felt. Normal bowel sounds heard. Central nervous system: Alert and oriented. No focal neurological deficits. Extremities: Symmetric 5 x 5 power. Right BKA, left great toe wound  Skin: No rashes, lesions or ulcers Psychiatry: Judgement and insight appear normal. Mood & affect appropriate.     Data Reviewed: I have personally reviewed following labs and imaging studies  CBC: Recent Labs  Lab 07/18/20 1413 07/21/20 0055  WBC 11.8* 6.7  NEUTROABS 10.3*  --   HGB 11.4* 10.0*  HCT 34.5* 31.0*  MCV 95.0 94.2  PLT 123* 419*   Basic Metabolic Panel: Recent Labs  Lab 07/18/20 1413 07/20/20 0950 07/21/20 0055  NA 139 141 142  K 4.0 3.8 4.2  CL 103 109 111  CO2 24 21* 22  GLUCOSE 187* 163* 119*  BUN 66* 49* 47*  CREATININE 2.65* 2.04* 1.88*  CALCIUM 10.2 9.9 9.7  MG  --   --  2.2  PHOS  --   --  2.6   GFR: Estimated Creatinine Clearance: 41.9 mL/min (A) (by C-G formula based on SCr of 1.88 mg/dL (H)). Liver Function Tests: Recent Labs  Lab 07/18/20 1413 07/21/20 0055   AST 27 35  ALT 20 16  ALKPHOS 52 39  BILITOT 1.1 0.8  PROT 7.0 5.8*  ALBUMIN 3.7 2.6*   No results for input(s): LIPASE, AMYLASE in the last 168 hours. No results for input(s): AMMONIA in the last 168 hours. Coagulation Profile: Recent Labs  Lab 07/18/20 1413  INR 1.2   Cardiac Enzymes: No results for input(s): CKTOTAL, CKMB, CKMBINDEX, TROPONINI in the last 168 hours. BNP (last 3 results) No results for input(s): PROBNP in the last 8760 hours. HbA1C: Recent Labs    07/19/20 0330  HGBA1C 6.1*   CBG: Recent Labs  Lab 07/20/20 0609 07/20/20 1200 07/20/20 1603 07/20/20 2109 07/21/20 0618  GLUCAP 108* 126* 116* 146* 156*   Lipid Profile: Recent Labs    07/19/20 0330  CHOL 90  HDL 38*  LDLCALC 43  TRIG 44  CHOLHDL 2.4   Thyroid Function Tests: No results for input(s): TSH, T4TOTAL, FREET4, T3FREE, THYROIDAB in the last 72 hours. Anemia Panel: No results for input(s): VITAMINB12, FOLATE, FERRITIN, TIBC, IRON, RETICCTPCT in the last 72 hours. Sepsis Labs: Recent Labs  Lab 07/18/20 1413 07/18/20 1525  LATICACIDVEN 2.4* 1.2    Recent Results (from the past 240 hour(s))  Blood Culture (routine x 2)     Status: None (Preliminary result)   Collection Time: 07/18/20  2:12 PM   Specimen: BLOOD  Result Value Ref Range Status   Specimen Description   Final    BLOOD RIGHT ANTECUBITAL Performed at [facility] [address] [place], [place] [address]    Special Requests   Final    BOTTLES DRAWN AEROBIC AND ANAEROBIC Blood Culture results may not be optimal due to an excessive volume of blood received in culture bottles Performed at [facility] [address], [place], [place] [address]    Culture   Final    NO GROWTH 3 DAYS Performed at [facility], [place]  [address] [place], [place] [address]    Report Status PENDING  Incomplete  Blood Culture (routine x 2)     Status: None (Preliminary result)   Collection  Time: 07/18/20  2:13 PM   Specimen: BLOOD  Result Value Ref Range Status   Specimen Description   Final    BLOOD LEFT ANTECUBITAL Performed at [name] [address], [place], [place] [address]    Special Requests   Final    BOTTLES DRAWN AEROBIC AND ANAEROBIC Blood Culture adequate volume Performed at [facility] [address], [place], [place] [address]    Culture   Final    NO GROWTH 3 DAYS Performed at [facility], [place] [address] [place], [place] [address]    Report Status PENDING  Incomplete  SARS Coronavirus 2 by RT PCR (hospital order, performed in [facility] hospital lab) Nasopharyngeal Nasopharyngeal Swab     Status: None   Collection Time: 07/18/20  2:20 PM   Specimen: Nasopharyngeal Swab  Result Value Ref Range Status   SARS Coronavirus 2 NEGATIVE NEGATIVE Final    Comment: (NOTE) SARS-CoV-2 target nucleic acids are NOT DETECTED.  The SARS-CoV-2 RNA is generally detectable in upper and lower respiratory specimens during the acute phase of infection. The lowest concentration of SARS-CoV-2 viral copies this assay can detect is 250 copies / mL. A negative result does not preclude SARS-CoV-2 infection and should not be used as the sole basis for treatment or other patient management decisions.  A negative result may occur with improper specimen collection / handling, submission of specimen other than nasopharyngeal swab, presence of viral mutation(s) within the areas targeted by this assay, and inadequate number of viral copies (<250 copies / mL). A negative result must be combined with clinical observations, patient history, and epidemiological information.  Fact Sheet for Patients:   StrictlyIdeas.no  Fact Sheet for Healthcare Providers: BankingDealers.co.za  This test is not yet approved or  cleared by the [place] FDA and has been authorized for detection  and/or diagnosis of SARS-CoV-2 by FDA under an Emergency Use Authorization (EUA).  This EUA will remain in effect (meaning this test can be used) for the duration of the COVID-19 declaration under Section 564(b)(1) of the Act, 21 U.S.C. section 360bbb-3(b)(1), unless the authorization is terminated or revoked sooner.  Performed at [facility], [place] [address], [place], [place] [address]   Urine culture     Status: Abnormal   Collection Time: 07/18/20  7:06 PM   Specimen: In/Out Cath Urine  Result Value Ref Range Status   Specimen Description   Final    IN/OUT CATH URINE Performed at [facility] [address], [place], [place] [address]    Special Requests   Final    NONE Performed at [facility], [place] [address], [place], [place] [address]    Culture MULTIPLE SPECIES PRESENT, SUGGEST RECOLLECTION (A)  Final   Report Status 07/20/2020 FINAL  Final    Radiology Studies: MR FOOT LEFT WO CONTRAST  Result Date: 07/20/2020 CLINICAL DATA:  Osteomyelitis, great toe draining wound. EXAM: MRI OF THE LEFT FOOT WITHOUT CONTRAST TECHNIQUE: Multiplanar, multisequence MR imaging of the left forefoot was performed. No intravenous contrast was administered. COMPARISON:  07/19/2020 radiographs FINDINGS: Bones/Joint/Cartilage Abnormal marrow edema in the distal and proximal phalanges of the great toe centered at the interphalangeal joint. The patient has a known new medial erosion of the head of the  proximal phalanx of the great toe. There is no joint effusion. No other significant abnormal osseous edema along the forefoot. Ligaments The Lisfranc ligament appears intact. Muscles and Tendons Low-grade edema signal along the plantar musculature of the foot, probably neurogenic. Muscular atrophy. Soft tissues Considerable subcutaneous edema in the forefoot, especially dorsally, extending into the toes. No visible drainable abscess. IMPRESSION: 1.  Abnormal marrow edema in the distal and proximal phalanges of the great toe centered at the interphalangeal joint. The patient has a known new medial erosion of the head of the proximal phalanx of the great toe. Gout or osteomyelitis could cause this imaging appearance; presence of the wound along the great toe tends to favor infection, and if there is drainage then the likelihood of infection increases further. 2. Subcutaneous edema in the forefoot, especially dorsally, extending into the toes. No visible drainable abscess. 3. Low-grade edema signal along the plantar musculature of the foot, probably neurogenic. Electronically Signed   By: Van Clines M.D.   On: 07/20/2020 13:13   DG Toe Great Left  Result Date: 07/19/2020 CLINICAL DATA:  Cellulitis of the foot. Left great toe wound for 8 weeks. EXAM: LEFT GREAT TOE COMPARISON:  05/15/2020 FINDINGS: No acute fracture or dislocation. Generalized osteopenia. Erosion of the distal medial corner of the first proximal phalanx new compared with the prior examination of 05/15/2020 concerning for osteomyelitis versus a crystalline arthropathy such as gout. Soft tissue are unremarkable. No radiopaque foreign body or soft tissue emphysema. Peripheral vascular atherosclerotic disease. IMPRESSION: 1. No acute osseous injury of the left great toe. 2. Erosion of the distal medial corner of the first proximal phalanx new compared with the prior examination of 05/15/2020 concerning for osteomyelitis versus a crystalline arthropathy such as gout. Electronically Signed   By: Kathreen Devoid   On: 07/19/2020 15:30   VAS [place] ABI WITH/WO TBI  Result Date: 07/20/2020 LOWER EXTREMITY DOPPLER STUDY Indications: Ulceration, and peripheral artery disease. High Risk         Hypertension, hyperlipidemia, Diabetes, past history of Factors:          smoking.  Vascular Interventions: Prev RLE BKA. Limitations: Today's exam was limited due to an open wound and bandages. Comparison Study:  Prev 09/2018 Non-compressible Performing Technologist: Vonzell Schlatter RVT  Examination Guidelines: A complete evaluation includes at minimum, Doppler waveform signals and systolic blood pressure reading at the level of bilateral brachial, anterior tibial, and posterior tibial arteries, when vessel segments are accessible. Bilateral testing is considered an integral part of a complete examination. Photoelectric Plethysmograph (PPG) waveforms and toe systolic pressure readings are included as required and additional duplex testing as needed. Limited examinations for reoccurring indications may be performed as noted.  ABI Findings: +--------+------------------+-----+---------+-------+ Left    Lt Pressure (mmHg)IndexWaveform Comment +--------+------------------+-----+---------+-------+ [id number]                    triphasic        +--------+------------------+-----+---------+-------+ PTA     254               1.54 biphasic         +--------+------------------+-----+---------+-------+ DP      254               1.54 biphasic         +--------+------------------+-----+---------+-------+ +-------+-----------+-----------+------------+------------+ ABI/TBIToday's ABIToday's TBIPrevious ABIPrevious TBI +-------+-----------+-----------+------------+------------+ Right  1.5 [place]                Non-comp                 +-------+-----------+-----------+------------+------------+  Left   1.5 [place]                Non-comp                 +-------+-----------+-----------+------------+------------+ Arterial wall calcification precludes accurate ankle pressures and ABIs.  Summary: Left: Resting left ankle-brachial index indicates noncompressible left lower extremity arteries. ABIs are unreliable. Open wound left great toe.  *See table(s) above for measurements and observations.  Electronically signed by Jamelle Haring on 07/20/2020 at 5:06:55 PM.   Final    VAS US CAROTID (at [facility] only)  Result  Date: 07/19/2020 Carotid Arterial Duplex Study Indications:       CVA. Risk Factors:      Hypertension, hyperlipidemia, Diabetes, PAD. Comparison Study:  No previous exam Performing Technologist: Vonzell Schlatter RVT  Examination Guidelines: A complete evaluation includes B-mode imaging, spectral Doppler, color Doppler, and power Doppler as needed of all accessible portions of each vessel. Bilateral testing is considered an integral part of a complete examination. Limited examinations for reoccurring indications may be performed as noted.  Right Carotid Findings: +----------+--------+--------+--------+------------------+-----------------+           PSV cm/sEDV cm/sStenosisPlaque DescriptionComments          +----------+--------+--------+--------+------------------+-----------------+ CCA Prox  89      14                                                  +----------+--------+--------+--------+------------------+-----------------+ CCA Distal77      17                                                  +----------+--------+--------+--------+------------------+-----------------+ ICA Prox  87      18      1-39%   heterogenous      Buld plaque noted +----------+--------+--------+--------+------------------+-----------------+ ICA Distal126     23                                                  +----------+--------+--------+--------+------------------+-----------------+ ECA       121                                                         +----------+--------+--------+--------+------------------+-----------------+ +----------+--------+-------+--------+-------------------+           PSV cm/sEDV cmsDescribeArm Pressure (mmHG) +----------+--------+-------+--------+-------------------+ Subclavian220                                        +----------+--------+-------+--------+-------------------+  Left Carotid Findings:  +----------+--------+--------+--------+------------------+-----------------+           PSV cm/sEDV cm/sStenosisPlaque DescriptionComments          +----------+--------+--------+--------+------------------+-----------------+ CCA Prox  110     18                                                  +----------+--------+--------+--------+------------------+-----------------+  CCA Distal81      12                                                  +----------+--------+--------+--------+------------------+-----------------+ ICA Prox  85      17      1-39%   heterogenous      Bulb plaque noted +----------+--------+--------+--------+------------------+-----------------+ ICA Distal63      13                                                  +----------+--------+--------+--------+------------------+-----------------+ ECA       104     16                                                  +----------+--------+--------+--------+------------------+-----------------+ +----------+--------+--------+--------+-------------------+           PSV cm/sEDV cm/sDescribeArm Pressure (mmHG) +----------+--------+--------+--------+-------------------+ [id number]                                         +----------+--------+--------+--------+-------------------+ +---------+--------+--+--------+ VertebralPSV cm/s51EDV cm/s +---------+--------+--+--------+   Summary: Right Carotid: Velocities in the right ICA are consistent with a 1-39% stenosis. Left Carotid: Velocities in the left ICA are consistent with a 1-39% stenosis. Vertebrals:  Bilateral vertebral arteries demonstrate antegrade flow. Subclavians: Normal flow hemodynamics were seen in bilateral subclavian              arteries. *See table(s) above for measurements and observations.  Electronically signed by Monica Martinez MD on 07/19/2020 at 2:27:52 PM.    Final     Scheduled Meds: .  stroke: mapping our early stages of recovery  book   Does not apply Once  . (feeding supplement) PROSource Plus  30 mL Oral TID with meals  . aspirin EC  81 mg Oral Daily  . clopidogrel  75 mg Oral Daily  . diltiazem  300 mg Oral Daily  . dorzolamide  1 drop Both Eyes BID  . enoxaparin (LOVENOX) injection  40 mg Subcutaneous Daily  . insulin aspart  0-6 Units Subcutaneous TID WC  . latanoprost  1 drop Both Eyes QHS  . metroNIDAZOLE  500 mg Oral Q8H  . multivitamin with minerals  1 tablet Oral Daily  . omega-3 acid ethyl esters  2 g Oral Daily  . pantoprazole  40 mg Oral QHS  . Ensure Max Protein  11 oz Oral TID BM  . rosuvastatin  10 mg Oral Q M,W,F   Continuous Infusions: . sodium chloride 50 mL/hr at 07/20/20 2123  . cefTRIAXone (ROCEPHIN)  IV 2 g (07/20/20 2126)     LOS: 3 days    Time spent: 25 mins    Hilmar Moldovan, MD [facility]   If 7PM-7AM, please contact night-coverage

## 2020-07-21 NOTE — Plan of Care (Signed)
  Problem: Clinical Measurements: Goal: Ability to maintain clinical measurements within normal limits will improve Outcome: Progressing Goal: Will remain free from infection Outcome: Progressing Goal: Diagnostic test results will improve Outcome: Progressing Goal: Respiratory complications will improve Outcome: Progressing Goal: Cardiovascular complication will be avoided Outcome: Progressing   Problem: Safety: Goal: Ability to remain free from injury will improve Outcome: Progressing   Problem: Ischemic Stroke/TIA Tissue Perfusion: Goal: Complications of ischemic stroke/TIA will be minimized Outcome: Progressing

## 2020-07-21 NOTE — Consult Note (Signed)
[facility]    Date of Admission:  07/18/2020     Reason for Consult: left foot hallux ulcer    Referring Provider: Dwyane Dee   Abx: 2/06-c ceftriaxone 2/05-c metronidazole  2/05-06 cefepime 2/05 vanc/linezolid         Assessment: 84 yo male pvd, dm2, s/p right bka, chronic left hallux ulcer admitted 2/06 for stroke, now with mri left foot showing possible osteomyelitis hallux  Agree with mri imaging, but caveat is its lack of specificity. Also with dry gangrene the bone could have sclerosis and potentially look like this and could self amputate. At this time I would give another 7 days amox-clav for skin soft tissue infection as he had low grade fever this admission (although stroke is a confounder). Will also use doxy for the history of oxacillin resistant staph lugdenensis  I will follow him clinically and potentially repeat imaging. If risign inflammatory marker off abx along with more clinical/imaging evidence progression of bony involvement, we could treat at that time  Of note, he has  Recently treated for ssi early 05/2020. He also had bone scraping where the ulcer exposed, and pcr was negative. So given this evidence and the way it looks and severe PVD not amenable to revascularization, I would like to avoid treating him long term.     Plan: 1. Stop iv ceftriaxone/flagyl 2. Start amox-clav 875 mg po bid, and doxycycline 100 mg po bid; aim for  7 days until 07/28/2020 3. Will f/u in clinic with me: appointment is 3/02 @ 2pm 4. Will check inflammatory markers and see what his wound looks like 5. He'll also need to f/u with dr Dellia Nims in wound care  ID will sign off   [facility] [address], [place], [place] [address] Phone: [phone]   Principal Problem:   Sepsis [facility]) Active Problems:   S/P unilateral BKA (below knee amputation) ([place])   Essential hypertension   Gout   CKD (chronic kidney disease), stage III (HCC)    RBBB   CKD (chronic kidney disease) stage 4, GFR 15-29 ml/min (HCC)   PAD (peripheral artery disease) ([place])   AKI (acute kidney injury) ([place])   Controlled type 2 diabetes mellitus with stable proliferative retinopathy of both eyes, with long-term current use of insulin ([place])   Diabetic ulcer of left great toe (HCC)   Cerebral ischemia   Thrombocytopenia (HCC)   Cerebral thrombosis with cerebral infarction   Scheduled Meds: .  stroke: mapping our early stages of recovery book   Does not apply Once  . (feeding supplement) PROSource Plus  30 mL Oral TID with meals  . aspirin EC  81 mg Oral Daily  . clopidogrel  75 mg Oral Daily  . diltiazem  300 mg Oral Daily  . dorzolamide  1 drop Both Eyes BID  . enoxaparin (LOVENOX) injection  40 mg Subcutaneous Daily  . insulin aspart  0-6 Units Subcutaneous TID WC  . latanoprost  1 drop Both Eyes QHS  . metroNIDAZOLE  500 mg Oral Q8H  . multivitamin with minerals  1 tablet Oral Daily  . omega-3 acid ethyl esters  2 g Oral Daily  . pantoprazole  40 mg Oral QHS  . Ensure Max Protein  11 oz Oral TID BM  . rosuvastatin  10 mg Oral Q M,W,F   Continuous Infusions: . sodium chloride 50 mL/hr at 07/20/20 2123  . cefTRIAXone (ROCEPHIN)  IV  2 g (07/20/20 2126)   PRN Meds:.acetaminophen **OR** acetaminophen (TYLENOL) oral liquid 160 mg/5 mL **OR** acetaminophen  HPI: Micheal Walls is a 85 y.o. male dm2, pvd s/p right bka, venous stasis, chronic left big toe ulcer admitted 2/5 for sirs and stroke, found to have imaging mri om left hallux  He initially was seen at [facility] long on 2/5 after acutely couldn't get out of bed (ems activated), but without numbness/focal weakness.   Stroke protocol activated. Brain mri/mra limited due to motion but small area of dwi hyperintensity right frontal lobe and remote bilateral large cerebellar infarct/pontine infarct; severe stenosis/occlusion right distal intradural vertebral artery and possible left vertebral  artery stenosis.; he is now on aspirin/plavix  He denies focal weakness/numbness still today  He had chronic left hallux ulcer for 4-5 months. Followed at wound clinic. Denies it getting larger. Denies home fever, chill, swelling of the toe/redness/pus. He had a low grade temp 100.4 on admission here. bcx negative. Mri left foot show some tissue swelling along with hallux marrow edema/erosion  On 05/2020 there was a superficial swab of the ulcer which showed staph lugdenensis. Dr Dellia Nims put him on a short course of doxycycline for 2 weeks  On 07/07/20 he had a bone scraping and pcr was negative for any organism  He reports havign recent angiogram with angioplasty for LLE although I couldn't find record of it and dr Robson's note mention patient not revascularization candidate given his ckd (baseline cr around 2s). His last vascular surgery visit was 01/2019  He is on ceftriaxone now along with metronidazole His crp was elevated but esr relatively stable   Review of Systems: ROS Other ros negative  Past Medical History:  Diagnosis Date  . Acute metabolic encephalopathy 01/12/5002  . Amputated below knee ([place])    right  . Anemia   . Aspiration pneumonia ([place]) 11/02/2019  . Diastolic heart failure ([place])   . Diverticulosis   . DM (diabetes mellitus) ([place])   . Gout   . Hyperlipemia   . OSA on CPAP   . RBBB   . Sepsis ([place]) 11/02/2019  . Systemic hypertension     Social History   Tobacco Use  . Smoking status: Former Smoker    Types: Cigars    Quit date: 06/12/2001    Years since quitting: 19.1  . Smokeless tobacco: Never Used  Vaping Use  . Vaping Use: Former  Substance Use Topics  . Alcohol use: No    Alcohol/week: 0.0 standard drinks  . Drug use: No    Family History  Problem Relation Age of Onset  . Diabetes Mother   . Heart attack Father   . Cancer Sister    Allergies  Allergen Reactions  . Vancomycin Rash    OBJECTIVE: Blood pressure 138/62, pulse 83,  temperature 98.6 F (37 C), temperature source Oral, resp. rate (!) 24, height 6' 1"  (1.854 m), weight 124.7 kg, SpO2 98 %.  Physical Exam Obese, no distress, conversant Heent: normocephalic; per; conj clear; eomi Neck supple cv rrr no mrg Lungs clear abd s/nt Ext 1+ edema LLE to below knee; s/p right bka Msk: bka stump no ulcer; left hallux small 0.5 cm ulcer with dry escar dorsum lateral left hallux, and similar size medially; no swellign/tenderness/purulence Neuro strength symmetric bilateral upper ext 5/5; cn2-12 intact Psych alert/oriented  Lab Results Lab Results  Component Value Date   WBC 6.7 07/21/2020   HGB 10.0 (L) 07/21/2020   HCT 31.0 (L) 07/21/2020  MCV 94.2 07/21/2020   PLT 123 (L) 07/21/2020    Lab Results  Component Value Date   CREATININE 1.88 (H) 07/21/2020   BUN 47 (H) 07/21/2020   NA 142 07/21/2020   K 4.2 07/21/2020   CL 111 07/21/2020   CO2 22 07/21/2020    Lab Results  Component Value Date   ALT 16 07/21/2020   AST 35 07/21/2020   ALKPHOS 39 07/21/2020   BILITOT 0.8 07/21/2020     Microbiology: Recent Results (from the past 240 hour(s))  Blood Culture (routine x 2)     Status: None (Preliminary result)   Collection Time: 07/18/20  2:12 PM   Specimen: BLOOD  Result Value Ref Range Status   Specimen Description   Final    BLOOD RIGHT ANTECUBITAL Performed at [facility], [place] [name] [place], [place] [address]    Special Requests   Final    BOTTLES DRAWN AEROBIC AND ANAEROBIC Blood Culture results may not be optimal due to an excessive volume of blood received in culture bottles Performed at [facility] [address], [place], [place] [address]    Culture   Final    NO GROWTH 3 DAYS Performed at [facility], [place] [address], [place], [place] [address]    Report Status PENDING  Incomplete  Blood Culture (routine x 2)     Status: None (Preliminary result)   Collection Time: 07/18/20   2:13 PM   Specimen: BLOOD  Result Value Ref Range Status   Specimen Description   Final    BLOOD LEFT ANTECUBITAL Performed at [facility] [address] [place], [place] [address]    Special Requests   Final    BOTTLES DRAWN AEROBIC AND ANAEROBIC Blood Culture adequate volume Performed at [facility] [address] [place], [place] [address]    Culture   Final    NO GROWTH 3 DAYS Performed at [facility], [place] [address] [place], [place] [address]    Report Status PENDING  Incomplete  SARS Coronavirus 2 by RT PCR (hospital order, performed in [facility] hospital lab) Nasopharyngeal Nasopharyngeal Swab     Status: None   Collection Time: 07/18/20  2:20 PM   Specimen: Nasopharyngeal Swab  Result Value Ref Range Status   SARS Coronavirus 2 NEGATIVE NEGATIVE Final    Comment: (NOTE) SARS-CoV-2 target nucleic acids are NOT DETECTED.  The SARS-CoV-2 RNA is generally detectable in upper and lower respiratory specimens during the acute phase of infection. The lowest concentration of SARS-CoV-2 viral copies this assay can detect is 250 copies / mL. A negative result does not preclude SARS-CoV-2 infection and should not be used as the sole basis for treatment or other patient management decisions.  A negative result may occur with improper specimen collection / handling, submission of specimen other than nasopharyngeal swab, presence of viral mutation(s) within the areas targeted by this assay, and inadequate number of viral copies (<250 copies / mL). A negative result must be combined with clinical observations, patient history, and epidemiological information.  Fact Sheet for Patients:   StrictlyIdeas.no  Fact Sheet for Healthcare Providers: BankingDealers.co.za  This test is not yet approved or  cleared by the [place] FDA and has been authorized for detection and/or diagnosis  of SARS-CoV-2 by FDA under an Emergency Use Authorization (EUA).  This EUA will remain in effect (meaning this test can be used) for the duration of  the COVID-19 declaration under Section 564(b)(1) of the Act, 21 U.S.C. section 360bbb-3(b)(1), unless the authorization is terminated or revoked sooner.  Performed at [facility], [place] [address] [place], [place] [address]   Urine culture     Status: Abnormal   Collection Time: 07/18/20  7:06 PM   Specimen: In/Out Cath Urine  Result Value Ref Range Status   Specimen Description   Final    IN/OUT CATH URINE Performed at [facility] [address], [place], [place] [address]    Special Requests   Final    NONE Performed at [facility], [place] [address], [place], [place] [address]    Culture MULTIPLE SPECIES PRESENT, SUGGEST RECOLLECTION (A)  Final   Report Status 07/20/2020 FINAL  Final   Crp/esr 2/07    21.8/29  Micro: 2/05 bcx negative 2/05 ucx multiple bacterial species 12/3 swab cx left toe ulcer Staph lugdenensis R oxacillin; S doxy/bactrim Propionibacterium propionicus  Imaging: 2/7 mr left foot 1. Abnormal marrow edema in the distal and proximal phalanges of the great toe centered at the interphalangeal joint. The patient has a known new medial erosion of the head of the proximal phalanx of the great toe. Gout or osteomyelitis could cause this imaging appearance; presence of the wound along the great toe tends to favor infection, and if there is drainage then the likelihood of infection increases further. 2. Subcutaneous edema in the forefoot, especially dorsally, extending into the toes. No visible drainable abscess. 3. Low-grade edema signal along the plantar musculature of the foot, probably neurogenic.   2/6 abi Left: Resting left ankle-brachial index indicates noncompressible left  lower extremity arteries. ABIs are unreliable.   Open wound left  great toe.     Jabier Mutton, [facility] for Infectious [place] [phone] pager    07/21/2020, 11:24 AM

## 2020-07-22 ENCOUNTER — Other Ambulatory Visit: Payer: Self-pay | Admitting: Internal Medicine

## 2020-07-22 ENCOUNTER — Ambulatory Visit: Payer: Medicare Other | Admitting: Podiatry

## 2020-07-22 DIAGNOSIS — I10 Essential (primary) hypertension: Secondary | ICD-10-CM | POA: Diagnosis not present

## 2020-07-22 LAB — BASIC METABOLIC PANEL
Anion gap: 9 (ref 5–15)
BUN: 41 mg/dL — ABNORMAL HIGH (ref 8–23)
CO2: 20 mmol/L — ABNORMAL LOW (ref 22–32)
Calcium: 9.7 mg/dL (ref 8.9–10.3)
Chloride: 114 mmol/L — ABNORMAL HIGH (ref 98–111)
Creatinine, Ser: 1.55 mg/dL — ABNORMAL HIGH (ref 0.61–1.24)
GFR, Estimated: 44 mL/min — ABNORMAL LOW (ref >60)
Glucose, Bld: 154 mg/dL — ABNORMAL HIGH (ref 70–99)
Potassium: 4 mmol/L (ref 3.5–5.1)
Sodium: 143 mmol/L (ref 135–145)

## 2020-07-22 LAB — GLUCOSE, CAPILLARY
Glucose-Capillary: 127 mg/dL — ABNORMAL HIGH (ref 70–99)
Glucose-Capillary: 171 mg/dL — ABNORMAL HIGH (ref 70–99)

## 2020-07-22 MED ORDER — CLOPIDOGREL BISULFATE 75 MG PO TABS: 75.0000 mg | ORAL_TABLET | Freq: Every day | ORAL | 0 refills | Status: AC

## 2020-07-22 MED ORDER — AMOXICILLIN-POT CLAVULANATE 875-125 MG PO TABS: 1.0000 | ORAL_TABLET | Freq: Two times a day (BID) | ORAL | 0 refills | Status: DC

## 2020-07-22 MED ORDER — AMOXICILLIN-POT CLAVULANATE 875-125 MG PO TABS: 1.0000 | ORAL_TABLET | Freq: Two times a day (BID) | ORAL | 0 refills | Status: AC

## 2020-07-22 MED ORDER — CLOPIDOGREL BISULFATE 75 MG PO TABS: 75.0000 mg | ORAL_TABLET | Freq: Every day | ORAL | 0 refills | Status: DC

## 2020-07-22 MED ORDER — DOXYCYCLINE HYCLATE 100 MG PO TABS: 100.0000 mg | ORAL_TABLET | Freq: Two times a day (BID) | ORAL | 0 refills | Status: AC

## 2020-07-22 MED ORDER — DOXYCYCLINE HYCLATE 100 MG PO TABS: 100.0000 mg | ORAL_TABLET | Freq: Two times a day (BID) | ORAL | 0 refills | Status: DC

## 2020-07-22 MED FILL — CLOPIDOGREL 75 MG TABLET: 75 | 30 days supply | Qty: 30 | Fill #0

## 2020-07-22 MED FILL — AMOX-CLAV 875-125 MG TABLET: 875-125 | 6 days supply | Qty: 12 | Fill #0

## 2020-07-22 MED FILL — DOXYCYCLINE HYCLATE 100 MG: 100 | 6 days supply | Qty: 12 | Fill #0

## 2020-07-22 NOTE — Progress Notes (Signed)
Occupational Therapy Treatment Patient Details Name: Micheal Walls MRN: [id number] DOB: 05/27/1936 Today's Date: 07/22/2020    History of present illness 85 y.o. male presenting with generalized weakness. Imaging suspicious for R sided cerebral infarct of unclear age. MRI (+) remote large bilateral cerebellar infarcts anda  remote pontine infarct. Patient also found to have sepsis of unclear source and AKI. PMHx significant for diastolic CHF, DM, HTN, R BKA, L great toe diabetic ulcer, and anemia.   OT comments  Pt making steady progress towards OT goals this session. Pt continues to present with impaired balance, generalized deconditioning and decreased ability to care for self. Pt currently requiring, MIN A +1 for stand pivot transfer with RW and up to min guard assist for LB dressing tasks from EOB. Pt would continue to benefit from skilled occupational therapy while admitted and after d/c to address the below listed limitations in order to improve overall functional mobility and facilitate independence with BADL participation. DC plan remains appropriate, will follow acutely per POC.     Follow Up Recommendations  Home health OT    Equipment Recommendations  Other (comment) (RW)    Recommendations for Other Services      Precautions / Restrictions Precautions Precautions: Fall Precaution Comments: R BKA with prosthesis, h/o old R CVA with mild L weakness Required Braces or Orthoses: Other Brace Other Brace: darco shoe on LLE, prosthetic in room for RLE Restrictions Weight Bearing Restrictions: No Other Position/Activity Restrictions: WBAT per podiatrist       Mobility Bed Mobility Overal bed mobility: Needs Assistance Bed Mobility: Supine to Sit     Supine to sit: Min guard;HOB elevated     General bed mobility comments: pt required cues to sequence task specifically to reach across for bed rail with RUE when transitioning into sitting, min guard for  safety  Transfers Overall transfer level: Needs assistance Equipment used: Rolling walker (2 wheeled) Transfers: Sit to/from [facility] Sit to Stand: Min assist Stand pivot transfers: Min assist;From elevated surface       General transfer comment: pt able to sit<>stand from elevated EOB with MIN A to rise, MIN A for initial steadying assist, pt completed stand pivot transfer from EOB>recliner with assist to manage RW, cues for safety awareness when descending into chair    Balance Overall balance assessment: Needs assistance Sitting-balance support: Feet supported;No upper extremity supported Sitting balance-Leahy Scale: Good Sitting balance - Comments: pt able to complete dynamic balance tasks from EOB with no UE support or LOB   Standing balance support: Bilateral upper extremity supported Standing balance-Leahy Scale: Poor Standing balance comment: reliant on BUE support                           ADL either performed or assessed with clinical judgement   ADL Overall ADL's : Needs assistance/impaired     Grooming: Wash/dry hands;Sitting;Set up Grooming Details (indicate cue type and reason): from recliner, pt request to wash hands, set- up assist             Lower Body Dressing: Set up;Min guard;Sitting/lateral leans Lower Body Dressing Details (indicate cue type and reason): pt able to sit EOB to don RLE prosthetic and L darco shoe with set- up assist, minguard for balance when reaching out of BOS Toilet Transfer: Minimal assistance;RW;Stand-pivot;Cueing for safety Toilet Transfer Details (indicate cue type and reason): pt able to complete stand pivot from EOB>recliner with RW and MIN A +  1, cues for safety needing when descending into recliner         Functional mobility during ADLs: Minimal assistance;Rolling walker General ADL Comments: pt continues to present with impaired balance, generalized deconditioning and decreased ability to  care for self impacting pts ability to complete ADLs independently.     Vision     Perception     Praxis      Cognition Arousal/Alertness: Awake/alert Behavior During Therapy: WFL for tasks assessed/performed Overall Cognitive Status: Within Functional Limits for tasks assessed                                          Exercises     Shoulder Instructions       General Comments pt confirm family able to assist at home, specifically pts grandson who is 20 y/o.    Pertinent Vitals/ Pain       Pain Assessment: No/denies pain  Home Living                                          Prior Functioning/Environment              Frequency  Min 2X/week        Progress Toward Goals  OT Goals(current goals can now be found in the care plan section)  Progress towards OT goals: Progressing toward goals  Acute Rehab OT Goals Patient Stated Goal: return home OT Goal Formulation: With patient Time For Goal Achievement: 08/03/20 Potential to Achieve Goals: Good  Plan Discharge plan remains appropriate;Frequency remains appropriate    Co-evaluation                 AM-PAC OT "6 Clicks" Daily Activity     Outcome Measure   Help from another person eating meals?: None Help from another person taking care of personal grooming?: A Little Help from another person toileting, which includes using toliet, bedpan, or urinal?: A Lot Help from another person bathing (including washing, rinsing, drying)?: A Lot Help from another person to put on and taking off regular upper body clothing?: A Little Help from another person to put on and taking off regular lower body clothing?: A Little 6 Click Score: 17    End of Session Equipment Utilized During Treatment: Rolling walker  OT Visit Diagnosis: Unsteadiness on feet (R26.81);Muscle weakness (generalized) (M62.81)   Activity Tolerance Patient tolerated treatment well   Patient Left in  chair;with call bell/phone within reach;with chair alarm set;with nursing/sitter in room   Nurse Communication Mobility status        Time: 0902-0920 OT Time Calculation (min): 18 min  Charges: OT General Charges $OT Visit: 1 Visit OT Treatments $Self Care/Home Management : 8-22 mins  Micheal Walls., COTA/L Acute Rehabilitation Services [phone]    [name] Haws 07/22/2020, 11:17 AM

## 2020-07-22 NOTE — Discharge Instructions (Signed)
aspirin and Plavix for 3 weeks then aspirin 81 mg daily alone afterwards

## 2020-07-22 NOTE — Discharge Summary (Signed)
Physician Discharge Summary  VAL SCHIAVO CBS:[id number] DOB: 10-20-1935 DOA: 07/18/2020  PCP: [name], [name]  Admit date: 07/18/2020 Discharge date: 07/22/2020  Admitted From: Home Disposition:  Home  Recommendations for Outpatient Follow-up:  1. Follow up with PCP in 1-2 weeks 2. Follow up with Neurology as scheduled  Home Health:PT and OT   Discharge Condition:Stable CODE STATUS:Full Diet recommendation: Diabetic, heart healthy   Brief/Interim Summary: 85 year old man with PMH diabetes mellitus with peripheral vascular disease status post Right BKA, diastolic CHF presented to emergency department after waking up feeling weak. Admitted for SIRS, suspected cerebral infarct, right vertebral artery stenosis, acute kidney injury.  Patient is transferred from [facility] for stroke work-up.  Neurology recommended aspirin and Plavix for 3 weeks followed by aspirin alone.  Patient is found to have a wound on left great toe,  MRI Left foot consistent with Infection.  Podiatry consulted states patient is not a surgical candidate given recent stroke and other comorbid conditions,  recommended 4 weeks of empiric antibiotics.  Infectious disease consulted for antibiotics regimen.  Discharge Diagnoses:  Principal Problem:   Sepsis ([place]) Active Problems:   S/P unilateral BKA (below knee amputation) ([place])   Essential hypertension   Gout   CKD (chronic kidney disease), stage III (HCC)   RBBB   CKD (chronic kidney disease) stage 4, GFR 15-29 ml/min (HCC)   PAD (peripheral artery disease) (HCC)   AKI (acute kidney injury) ([place])   Controlled type 2 diabetes mellitus with stable proliferative retinopathy of both eyes, with long-term current use of insulin (HCC)   Diabetic ulcer of left great toe (HCC)   Cerebral ischemia   Thrombocytopenia (HCC)   Cerebral thrombosis with cerebral infarction   Diabetic foot infection ([place])  Subacute right frontal stroke, severe stenosis versus occlusion  right distal intradural vertebral artery.  Patient presented with generalized weakness, is found to have subacute right frontal stroke on MRI. Thought secondary to poorly controlled diabetes, hypertension - TTE : LVEF 50-55%, No intracardiac source of emboli found. -Per nursing cleared swallow screen. Resumed diet. -Neurology recommended aspirin and Plavix for 3 weeks then aspirin 81 mg daily alone, Continue statin, DVT prophylaxis.  Sepsis secondary to diabetic left foot infection: -Ulcer on distal tip of left toe appears to be infected.  Foot otherwise appears uninvolved. Perfusion appears intact.  Lower extremity wound protocol initiated.  ABI unremarkable. -Narrow antibiotics. Wound care consult. - MRI of left foot ordered, consistent with infection. -Podiatry consulted, states poor surgical candidate given recent stroke and peripheral artery disease. -Recommended course of augmentin and doxycycline through 2/15 per ID recs  AKI superimposed on CKD, likely stage IIIb >> > Improving --Gentle IV fluids were given this admit. Held diuretic. -Resume meds on d/c  Thrombocytopenia, seen also May 2021. --Etiology unclear. Could be connected to sepsis or may be chronic. --Plts improved  Diabetes mellitus type 2 with peripheral vascular disease, status post right BKA, peripheral neuropathy --Hold gabapentin in setting of acute kidney injury. Hold semaglutide. --Sliding scale insulin while in hospital  Gout --Allopurinol on hold in the setting of AKI.   Essential hypertension.  BP meds on hold, permissive hypertension.  Glaucoma --Continue eye gtts   Discharge Instructions   Allergies as of 07/22/2020      Reactions   Vancomycin Rash      Medication List    STOP taking these medications   allopurinol 100 MG tablet Commonly known as: ZYLOPRIM   diclofenac Sodium 1 % Gel Commonly  known as: VOLTAREN   niacin 500 MG CR tablet Commonly known as:  NIASPAN     TAKE these medications   amoxicillin-clavulanate 875-125 MG tablet Commonly known as: AUGMENTIN Take 1 tablet by mouth every 12 (twelve) hours for 6 days.   aspirin EC 81 MG tablet Take 81 mg by mouth daily. Swallow whole.   Biotin 5000 MCG Tabs Take 5,000 mcg by mouth daily.   clopidogrel 75 MG tablet Commonly known as: PLAVIX Take 1 tablet (75 mg total) by mouth daily. Start taking on: July 23, 2020   colchicine 0.6 MG tablet Take 0.6 mg by mouth daily as needed (gout flare up).   diltiazem 300 MG 24 hr capsule Commonly known as: CARDIZEM CD Take 1 capsule (300 mg total) by mouth daily.   dorzolamide 2 % ophthalmic solution Commonly known as: TRUSOPT Place 1 drop into both eyes 2 (two) times daily.   doxycycline 100 MG tablet Commonly known as: VIBRA-TABS Take 1 tablet (100 mg total) by mouth every 12 (twelve) hours for 6 days.   ferrous sulfate 325 (65 FE) MG tablet Take 325 mg by mouth every Monday, Wednesday, and Friday.   fish oil-omega-3 fatty acids 1000 MG capsule Take 2 g by mouth daily.   FreeStyle Libre 14 Day Sensor Misc Use as directed to check blood sugars   FreeStyle Precision Neo Test test strip Generic drug: glucose blood Use as instructed   furosemide 40 MG tablet Commonly known as: LASIX Take 2 tabs in morning and 1 tab evening What changed:   how much to take  how to take this  when to take this  additional instructions   gabapentin 100 MG capsule Commonly known as: NEURONTIN Take 1 capsule by mouth 3 times a day What changed:   how much to take  when to take this   hydrocortisone 25 MG suppository Commonly known as: ANUSOL-HC Place 25 mg rectally 2 (two) times daily as needed for hemorrhoids or anal itching.   latanoprost 0.005 % ophthalmic solution Commonly known as: XALATAN Place 1 drop into both eyes at bedtime.   losartan 100 MG tablet Commonly known as: COZAAR Take 1/2 tablet by mouth at bedtime  (New directions) What changed: See the new instructions.   multivitamin with minerals Tabs tablet Take 1 tablet by mouth daily.   Ozempic ([name]) 2 MG/1.5ML Sopn Generic drug: Semaglutide ([name]) Inject 1 mg into the skin once a week. What changed: when to take this   pantoprazole 40 MG tablet Commonly known as: PROTONIX Take 1 tablet  by mouth daily. What changed: when to take this   Clint into the lungs See admin instructions. CPAP- At bedtime   REFRESH OP Place 1 drop into both eyes daily as needed (dry eyes).   rosuvastatin 10 MG tablet Commonly known as: CRESTOR Take 1 tablet (10 mg total) by mouth daily. What changed: when to take this            Durable Medical Equipment  (From admission, onward)         Start     Ordered   07/21/20 1203  For home use only DME Walker wide  Once       Question:  Patient needs a walker to treat with the following condition  Answer:  Cellulitis of left leg   07/21/20 1202          Follow-up Information    [facility]. Schedule an appointment  as soon as possible for a visit in 4 week(s).   Specialty: Neurology Contact information: [address] [place], [facility] Follow up.   Specialty: Home Health Services Why: The home health agency will contact you for the first home visit. Contact information: [place] [address] [phone]              Allergies  Allergen Reactions  . Vancomycin Rash    Consultations:  Neurology  ID  Podiatry  Procedures/Studies: CT Head Wo Contrast  Result Date: 07/18/2020 CLINICAL DATA:  Mental status change. EXAM: CT HEAD WITHOUT CONTRAST TECHNIQUE: Contiguous axial images were obtained from the base of the skull through the vertex without intravenous contrast. COMPARISON:  None. FINDINGS: Brain: Large area of  hypoattenuation in the right cerebellar cortex. More subtle area of hypoattenuation in the superior left cerebellum. No evidence of mass effect or midline shift. No evidence of acute hemorrhage. Vascular: Heavy calcific atherosclerotic disease of the intra cavernous carotid arteries. Skull: Normal. Negative for fracture or focal lesion. Sinuses/Orbits: Dense consolidation in the left maxillary sinus with high density material, query fungal sinusitis. The remainder of the paranasal sinuses and mastoid air cells are normal. Other: None. IMPRESSION: 1. Large area of hypoattenuation in the right cerebellar cortex. More subtle area of hypoattenuation in the superior left cerebellum. These findings may represent areas of acute/subacute infarction or other destructive brain process. 2. No evidence of acute hemorrhage. 3. Dense consolidation in the left maxillary sinus with high density material, query fungal sinusitis. 4. Evaluation with brain MRI is recommended. Electronically Signed   By: Fidela Salisbury M.D.   On: 07/18/2020 14:52   MR ANGIO HEAD WO CONTRAST  Addendum Date: 07/18/2020   ADDENDUM REPORT: 07/18/2020 19:37 ADDENDUM: On further review, while limited by motion there appears to be absent flow related signal within the right intradural vertebral artery except for distally, concerning for age-indeterminant occlusion or high-grade stenosis of the proximal right vertebral artery. CTA could further characterize if clinically indicated. Findings in the report and addendum were discussed with Percell Miller, [place] via telephone at 7:32 PM via telephone. Electronically Signed   By: Margaretha Sheffield MD   On: 07/18/2020 19:37   Result Date: 07/18/2020 CLINICAL DATA:  Neuro deficit, acute stroke suspected. EXAM: MRI HEAD WITHOUT CONTRAST MRA HEAD WITHOUT CONTRAST TECHNIQUE: Multiplanar, multiecho pulse sequences of the brain and surrounding structures were obtained without intravenous contrast. Angiographic images of the  Circle of Willis were obtained using MRA technique without intravenous contrast. CONTRAST:  6mL GADAVIST GADOBUTROL 1 MMOL/ML IV SOLN COMPARISON:  Same day head CT. FINDINGS: MRI HEAD FINDINGS Brain: Remote large bilateral cerebellar infarcts with encephalomalacia. Remote right paramidline pontine infarct. Small area of DWI hyperintensity without definite ADC correlate in the right frontal lobe. Mild associated edema without mass effect. Additional T2/FLAIR hyperintensities within the white matter, mild for age in likely related to chronic microvascular ischemic disease. No acute hemorrhage. No hydrocephalus. No mass lesion or abnormal mass effect. No extra-axial fluid collection. Moderate cerebral atrophy. Vascular: Poor flow void within the imaged intradural right vertebral artery Skull and upper cervical spine: Diffusely T1 hypointense marrow signal in the upper cervical spine. Sinuses/Orbits: Left maxillary sinus mucosal thickening and mild mucosal thickening of scattered ethmoid air cells. No air-fluid levels. Unremarkable orbits. Other: No mastoid effusions. MRA HEAD FINDINGS Significantly limited MRA due to patient motion. Within this limitation:  Anterior circulation: Bilateral internal carotid arteries are patent through the carotid siphons. Bilateral M1 and proximal M2 MCA branches are patent without evidence of hemodynamically significant proximal stenosis. Bilateral ACAs are patent. No visible aneurysm. Posterior circulation: The intradural vertebral arteries are poorly visualized due to motion; however, there is suspected severe stenosis or occlusion of the right distal intradural vertebral artery and possible left intradural vertebral artery stenosis. Diminutive vertebrobasilar system with bilateral fetal type PCAs. There is suspected mild stenosis of the basilar artery. Bilateral PCAs are patent without evidence of hemodynamically significant proximal stenosis. No visible aneurysm. IMPRESSION: MRI  head: 1. Small area of DWI hyperintensity without definite ADC correlate in the right frontal lobe, either T2 shine through/artifact or subacute infarct. 2. Remote large bilateral cerebellar infarcts and a remote pontine infarct. 3. Diffusely T1 hypointense marrow signal in the upper cervical spine, which is nonspecific and could relate to chronic anemia, chronic hypoxia (such as in smokers), or a lymphoproliferative disorder. MRA: 1. Significantly limited MRA secondary to patient motion. 2. The intradural vertebral arteries are poorly visualized; however, there is suspected severe stenosis or occlusion of the right distal intradural vertebral artery and possible left vertebral artery stenosis. A neck CTA could further characterize if clinically indicated. 3. Diminutive vertebrobasilar system with bilateral fetal type PCAs and likely mild narrowing of the basilar artery. 4. No large vessel occlusion or evidence of hemodynamically significant proximal stenosis in the anterior circulation Electronically Signed: By: Margaretha Sheffield MD On: 07/18/2020 19:25   MR Brain W and Lottie Dawson Contrast  Addendum Date: 07/18/2020   ADDENDUM REPORT: 07/18/2020 19:37 ADDENDUM: On further review, while limited by motion there appears to be absent flow related signal within the right intradural vertebral artery except for distally, concerning for age-indeterminant occlusion or high-grade stenosis of the proximal right vertebral artery. CTA could further characterize if clinically indicated. Findings in the report and addendum were discussed with Percell Miller, [place] via telephone at 7:32 PM via telephone. Electronically Signed   By: Margaretha Sheffield MD   On: 07/18/2020 19:37   Result Date: 07/18/2020 CLINICAL DATA:  Neuro deficit, acute stroke suspected. EXAM: MRI HEAD WITHOUT CONTRAST MRA HEAD WITHOUT CONTRAST TECHNIQUE: Multiplanar, multiecho pulse sequences of the brain and surrounding structures were obtained without intravenous contrast.  Angiographic images of the Circle of Willis were obtained using MRA technique without intravenous contrast. CONTRAST:  67mL GADAVIST GADOBUTROL 1 MMOL/ML IV SOLN COMPARISON:  Same day head CT. FINDINGS: MRI HEAD FINDINGS Brain: Remote large bilateral cerebellar infarcts with encephalomalacia. Remote right paramidline pontine infarct. Small area of DWI hyperintensity without definite ADC correlate in the right frontal lobe. Mild associated edema without mass effect. Additional T2/FLAIR hyperintensities within the white matter, mild for age in likely related to chronic microvascular ischemic disease. No acute hemorrhage. No hydrocephalus. No mass lesion or abnormal mass effect. No extra-axial fluid collection. Moderate cerebral atrophy. Vascular: Poor flow void within the imaged intradural right vertebral artery Skull and upper cervical spine: Diffusely T1 hypointense marrow signal in the upper cervical spine. Sinuses/Orbits: Left maxillary sinus mucosal thickening and mild mucosal thickening of scattered ethmoid air cells. No air-fluid levels. Unremarkable orbits. Other: No mastoid effusions. MRA HEAD FINDINGS Significantly limited MRA due to patient motion. Within this limitation: Anterior circulation: Bilateral internal carotid arteries are patent through the carotid siphons. Bilateral M1 and proximal M2 MCA branches are patent without evidence of hemodynamically significant proximal stenosis. Bilateral ACAs are patent. No visible aneurysm. Posterior circulation: The intradural vertebral arteries are  poorly visualized due to motion; however, there is suspected severe stenosis or occlusion of the right distal intradural vertebral artery and possible left intradural vertebral artery stenosis. Diminutive vertebrobasilar system with bilateral fetal type PCAs. There is suspected mild stenosis of the basilar artery. Bilateral PCAs are patent without evidence of hemodynamically significant proximal stenosis. No visible  aneurysm. IMPRESSION: MRI head: 1. Small area of DWI hyperintensity without definite ADC correlate in the right frontal lobe, either T2 shine through/artifact or subacute infarct. 2. Remote large bilateral cerebellar infarcts and a remote pontine infarct. 3. Diffusely T1 hypointense marrow signal in the upper cervical spine, which is nonspecific and could relate to chronic anemia, chronic hypoxia (such as in smokers), or a lymphoproliferative disorder. MRA: 1. Significantly limited MRA secondary to patient motion. 2. The intradural vertebral arteries are poorly visualized; however, there is suspected severe stenosis or occlusion of the right distal intradural vertebral artery and possible left vertebral artery stenosis. A neck CTA could further characterize if clinically indicated. 3. Diminutive vertebrobasilar system with bilateral fetal type PCAs and likely mild narrowing of the basilar artery. 4. No large vessel occlusion or evidence of hemodynamically significant proximal stenosis in the anterior circulation Electronically Signed: By: Margaretha Sheffield MD On: 07/18/2020 19:25   MR FOOT LEFT WO CONTRAST  Result Date: 07/20/2020 CLINICAL DATA:  Osteomyelitis, great toe draining wound. EXAM: MRI OF THE LEFT FOOT WITHOUT CONTRAST TECHNIQUE: Multiplanar, multisequence MR imaging of the left forefoot was performed. No intravenous contrast was administered. COMPARISON:  07/19/2020 radiographs FINDINGS: Bones/Joint/Cartilage Abnormal marrow edema in the distal and proximal phalanges of the great toe centered at the interphalangeal joint. The patient has a known new medial erosion of the head of the proximal phalanx of the great toe. There is no joint effusion. No other significant abnormal osseous edema along the forefoot. Ligaments The Lisfranc ligament appears intact. Muscles and Tendons Low-grade edema signal along the plantar musculature of the foot, probably neurogenic. Muscular atrophy. Soft tissues  Considerable subcutaneous edema in the forefoot, especially dorsally, extending into the toes. No visible drainable abscess. IMPRESSION: 1. Abnormal marrow edema in the distal and proximal phalanges of the great toe centered at the interphalangeal joint. The patient has a known new medial erosion of the head of the proximal phalanx of the great toe. Gout or osteomyelitis could cause this imaging appearance; presence of the wound along the great toe tends to favor infection, and if there is drainage then the likelihood of infection increases further. 2. Subcutaneous edema in the forefoot, especially dorsally, extending into the toes. No visible drainable abscess. 3. Low-grade edema signal along the plantar musculature of the foot, probably neurogenic. Electronically Signed   By: Van Clines M.D.   On: 07/20/2020 13:13   DG Chest Port 1 View  Result Date: 07/18/2020 CLINICAL DATA:  Sepsis. EXAM: PORTABLE CHEST 1 VIEW COMPARISON:  January 16, 2020. FINDINGS: The heart size and mediastinal contours are within normal limits. Both lungs are clear. The visualized skeletal structures are unremarkable. IMPRESSION: No active disease. Electronically Signed   By: Marijo Conception M.D.   On: 07/18/2020 14:20   DG Toe Great Left  Result Date: 07/19/2020 CLINICAL DATA:  Cellulitis of the foot. Left great toe wound for 8 weeks. EXAM: LEFT GREAT TOE COMPARISON:  05/15/2020 FINDINGS: No acute fracture or dislocation. Generalized osteopenia. Erosion of the distal medial corner of the first proximal phalanx new compared with the prior examination of 05/15/2020 concerning for osteomyelitis versus a crystalline  arthropathy such as gout. Soft tissue are unremarkable. No radiopaque foreign body or soft tissue emphysema. Peripheral vascular atherosclerotic disease. IMPRESSION: 1. No acute osseous injury of the left great toe. 2. Erosion of the distal medial corner of the first proximal phalanx new compared with the prior  examination of 05/15/2020 concerning for osteomyelitis versus a crystalline arthropathy such as gout. Electronically Signed   By: Kathreen Devoid   On: 07/19/2020 15:30   VAS [place] ABI WITH/WO TBI  Result Date: 07/20/2020 LOWER EXTREMITY DOPPLER STUDY Indications: Ulceration, and peripheral artery disease. High Risk         Hypertension, hyperlipidemia, Diabetes, past history of Factors:          smoking.  Vascular Interventions: Prev RLE BKA. Limitations: Today's exam was limited due to an open wound and bandages. Comparison Study: Prev 09/2018 Non-compressible Performing Technologist: Vonzell Schlatter RVT  Examination Guidelines: A complete evaluation includes at minimum, Doppler waveform signals and systolic blood pressure reading at the level of bilateral brachial, anterior tibial, and posterior tibial arteries, when vessel segments are accessible. Bilateral testing is considered an integral part of a complete examination. Photoelectric Plethysmograph (PPG) waveforms and toe systolic pressure readings are included as required and additional duplex testing as needed. Limited examinations for reoccurring indications may be performed as noted.  ABI Findings: +--------+------------------+-----+---------+-------+ Left    Lt Pressure (mmHg)IndexWaveform Comment +--------+------------------+-----+---------+-------+ [id number]                    triphasic        +--------+------------------+-----+---------+-------+ PTA     254               1.54 biphasic         +--------+------------------+-----+---------+-------+ DP      254               1.54 biphasic         +--------+------------------+-----+---------+-------+ +-------+-----------+-----------+------------+------------+ ABI/TBIToday's ABIToday's TBIPrevious ABIPrevious TBI +-------+-----------+-----------+------------+------------+ Right  1.5 [employer]                Non-comp                  +-------+-----------+-----------+------------+------------+ Left   1.5                 Non-comp                 +-------+-----------+-----------+------------+------------+ Arterial wall calcification precludes accurate ankle pressures and ABIs.  Summary: Left: Resting left ankle-brachial index indicates noncompressible left lower extremity arteries. ABIs are unreliable. Open wound left great toe.  *See table(s) above for measurements and observations.  Electronically signed by Jamelle Haring on 07/20/2020 at 5:06:55 PM.   Final    ECHOCARDIOGRAM COMPLETE  Result Date: 07/19/2020    ECHOCARDIOGRAM REPORT   Patient Name:   Micheal Walls Date of Exam: 07/19/2020 Medical Rec #:  [id number]         Height:       73.0 in Accession #:    [id number]        Weight:       275.0 lb Date of Birth:  June 20, 1935         BSA:          2.463 m Patient Age:    85 years          BP:           141/72 mmHg Patient Gender: M  HR:           88 bpm. Exam Location:  Inpatient Procedure: 2D Echo, Cardiac Doppler and Color Doppler Indications:    Stroke 434.91 / I163.9  History:        Patient has no prior history of Echocardiogram examinations.                 CHF, Arrythmias:RBBB; Risk Factors:Hypertension, Dyslipidemia,                 Diabetes and Sleep Apnea.  Sonographer:    Elmarie Shiley Dance Referring Phys: [id number] MATTHEW M ECKSTAT IMPRESSIONS  1. Left ventricular ejection fraction, by estimation, is 55 to 60%. The left ventricle has normal function. The left ventricle has no regional wall motion abnormalities. There is moderate left ventricular hypertrophy. Indeterminate diastolic filling due  to E-A fusion.  2. Right ventricular systolic function is normal. The right ventricular size is normal.  3. The pericardial effusion is posterior to the left ventricle.  4. The mitral valve is abnormal. Trivial mitral valve regurgitation.  5. The aortic valve is tricuspid. Aortic valve regurgitation is not visualized.  6.  Aortic dilatation noted. There is mild dilatation of the aortic root, measuring 42 mm.  7. The inferior vena cava is dilated in size with >50% respiratory variability, suggesting right atrial pressure of 8 mmHg. Conclusion(s)/Recommendation(s): No intracardiac source of embolism detected on this transthoracic study. A transesophageal echocardiogram is recommended to exclude cardiac source of embolism if clinically indicated. FINDINGS  Left Ventricle: Left ventricular ejection fraction, by estimation, is 55 to 60%. The left ventricle has normal function. The left ventricle has no regional wall motion abnormalities. The left ventricular internal cavity size was normal in size. There is  moderate left ventricular hypertrophy. Indeterminate diastolic filling due to E-A fusion. Right Ventricle: The right ventricular size is normal. No increase in right ventricular wall thickness. Right ventricular systolic function is normal. Left Atrium: Left atrial size was normal in size. Right Atrium: Right atrial size was normal in size. Pericardium: Trivial pericardial effusion is present. The pericardial effusion is posterior to the left ventricle. Mitral Valve: The mitral valve is abnormal. Mild mitral annular calcification. Trivial mitral valve regurgitation. Tricuspid Valve: The tricuspid valve is grossly normal. Tricuspid valve regurgitation is trivial. Aortic Valve: The aortic valve is tricuspid. Aortic valve regurgitation is not visualized. Pulmonic Valve: The pulmonic valve was normal in structure. Pulmonic valve regurgitation is not visualized. Aorta: Aortic dilatation noted. There is mild dilatation of the aortic root, measuring 42 mm. Venous: The inferior vena cava is dilated in size with greater than 50% respiratory variability, suggesting right atrial pressure of 8 mmHg. IAS/Shunts: No atrial level shunt detected by color flow Doppler.  LEFT VENTRICLE PLAX 2D LVIDd:         4.20 cm  Diastology LV PW:         1.20 cm   LV e' medial:    11.30 cm/s LV IVS:        1.40 cm  LV E/e' medial:  8.0 LVOT diam:     2.30 cm  LV e' lateral:   10.20 cm/s LV SV:         66       LV E/e' lateral: 8.8 LV SV Index:   27 LVOT Area:     4.15 cm  RIGHT VENTRICLE             IVC RV Basal diam:  2.10 cm  IVC diam: 2.00 cm RV S prime:     12.80 cm/s TAPSE (M-mode): 1.6 cm LEFT ATRIUM             Index       RIGHT ATRIUM           Index LA diam:        3.40 cm 1.38 cm/m  RA Area:     11.60 cm LA Vol (A2C):   70.5 ml 28.62 ml/m RA Volume:   22.90 ml  9.30 ml/m LA Vol (A4C):   56.3 ml 22.85 ml/m LA Biplane Vol: 64.4 ml 26.14 ml/m  AORTIC VALVE LVOT Vmax:   85.80 cm/s LVOT Vmean:  56.800 cm/s LVOT VTI:    0.160 m  AORTA Ao Root diam: 4.20 cm Ao Asc diam:  3.10 cm MITRAL VALVE MV Area (PHT): 4.15 cm    SHUNTS MV Decel Time: 183 msec    Systemic VTI:  0.16 m MV E velocity: 90.15 cm/s  Systemic Diam: 2.30 cm MV A velocity: 72.20 cm/s MV E/A ratio:  1.25 Lyman Bishop MD Electronically signed by Lyman Bishop MD Signature Date/Time: 07/19/2020/12:39:29 PM    Final    VAS US CAROTID (at [facility] and [facility] only)  Result Date: 07/19/2020 Carotid Arterial Duplex Study Indications:       CVA. Risk Factors:      Hypertension, hyperlipidemia, Diabetes, PAD. Comparison Study:  No previous exam Performing Technologist: Vonzell Schlatter RVT  Examination Guidelines: A complete evaluation includes B-mode imaging, spectral Doppler, color Doppler, and power Doppler as needed of all accessible portions of each vessel. Bilateral testing is considered an integral part of a complete examination. Limited examinations for reoccurring indications may be performed as noted.  Right Carotid Findings: +----------+--------+--------+--------+------------------+-----------------+           PSV cm/sEDV cm/sStenosisPlaque DescriptionComments          +----------+--------+--------+--------+------------------+-----------------+ CCA Prox  89      14                                                   +----------+--------+--------+--------+------------------+-----------------+ CCA Distal77      17                                                  +----------+--------+--------+--------+------------------+-----------------+ ICA Prox  87      18      1-39%   heterogenous      Buld plaque noted +----------+--------+--------+--------+------------------+-----------------+ ICA Distal126     23                                                  +----------+--------+--------+--------+------------------+-----------------+ ECA       121                                                         +----------+--------+--------+--------+------------------+-----------------+ +----------+--------+-------+--------+-------------------+  PSV cm/sEDV cmsDescribeArm Pressure (mmHG) +----------+--------+-------+--------+-------------------+ Subclavian220                                        +----------+--------+-------+--------+-------------------+  Left Carotid Findings: +----------+--------+--------+--------+------------------+-----------------+           PSV cm/sEDV cm/sStenosisPlaque DescriptionComments          +----------+--------+--------+--------+------------------+-----------------+ CCA Prox  110     18                                                  +----------+--------+--------+--------+------------------+-----------------+ CCA Distal81      12                                                  +----------+--------+--------+--------+------------------+-----------------+ ICA Prox  85      17      1-39%   heterogenous      Bulb plaque noted +----------+--------+--------+--------+------------------+-----------------+ ICA Distal63      13                                                  +----------+--------+--------+--------+------------------+-----------------+ ECA       104     16                                                   +----------+--------+--------+--------+------------------+-----------------+ +----------+--------+--------+--------+-------------------+           PSV cm/sEDV cm/sDescribeArm Pressure (mmHG) +----------+--------+--------+--------+-------------------+ [id number]                                         +----------+--------+--------+--------+-------------------+ +---------+--------+--+--------+ VertebralPSV cm/s51EDV cm/s +---------+--------+--+--------+   Summary: Right Carotid: Velocities in the right ICA are consistent with a 1-39% stenosis. Left Carotid: Velocities in the left ICA are consistent with a 1-39% stenosis. Vertebrals:  Bilateral vertebral arteries demonstrate antegrade flow. Subclavians: Normal flow hemodynamics were seen in bilateral subclavian              arteries. *See table(s) above for measurements and observations.  Electronically signed by Monica Martinez MD on 07/19/2020 at 2:27:52 PM.    Final      Subjective: Eager to go home  Discharge Exam: Vitals:   07/22/20 0742 07/22/20 1055  BP: (!) 156/81 132/76  Pulse: 90 89  Resp: 17 18  Temp: 98.3 F (36.8 C) 98 F (36.7 C)  SpO2: 98% 100%   Vitals:   07/21/20 2347 07/22/20 0404 07/22/20 0742 07/22/20 1055  BP: 136/66 (!) 143/62 (!) 156/81 132/76  Pulse: 87 91 90 89  Resp: $Remo'18 19 17 18  'ezein$ Temp: 97.9 F (36.6 C) 97.8 F (36.6 C) 98.3 F (36.8 C) 98 F (36.7 C)  TempSrc: Oral Oral Oral Oral  SpO2: 96% 97% 98% 100%  Weight:      Height:        General: Pt is alert, awake, not in acute distress Cardiovascular: RRR, S1/S2 +, no rubs, no gallops Respiratory: CTA bilaterally, no wheezing, no rhonchi Abdominal: Soft, NT, ND, bowel sounds + Extremities: no edema, no cyanosis   The results of significant diagnostics from this hospitalization (including imaging, microbiology, ancillary and laboratory) are listed below for reference.     Microbiology: Recent Results (from the past 240 hour(s))   Blood Culture (routine x 2)     Status: None (Preliminary result)   Collection Time: 07/18/20  2:12 PM   Specimen: BLOOD  Result Value [place] Status   Specimen Description   Final    BLOOD RIGHT ANTECUBITAL Performed at [facility] [address] [place], [place] [address]    Special Requests   Final    BOTTLES DRAWN AEROBIC AND ANAEROBIC Blood Culture results may not be optimal due to an excessive volume of blood received in culture bottles Performed at [place] [facility] [address], [place], [place] [address]    Culture   Final    NO GROWTH 4 DAYS Performed at [facility], [place] [address] [place], [place] [address]    Report Status PENDING  Incomplete  Blood Culture (routine x 2)     Status: None (Preliminary result)   Collection Time: 07/18/20  2:13 PM   Specimen: BLOOD  Result Value Ref [place] Status   Specimen Description   Final    BLOOD LEFT ANTECUBITAL Performed at [facility] [address], [place], [place] [address]    Special Requests   Final    BOTTLES DRAWN AEROBIC AND ANAEROBIC Blood Culture adequate volume Performed at [place] [address] [place], [place] [address]    Culture   Final    NO GROWTH 4 DAYS Performed at [facility], [place] [address] [place],  [address]    Report Status PENDING  Incomplete  SARS Coronavirus 2 by RT PCR (hospital order, performed in [facility] hospital lab) Nasopharyngeal Nasopharyngeal Swab     Status: None   Collection Time: 07/18/20  2:20 PM   Specimen: Nasopharyngeal Swab  Result Value [place] Status   SARS Coronavirus 2 NEGATIVE NEGATIVE Final    Comment: (NOTE) SARS-CoV-2 target nucleic acids are NOT DETECTED.  The SARS-CoV-2 RNA is generally detectable in upper and lower respiratory specimens during the acute phase of infection. The lowest concentration of SARS-CoV-2 viral copies this assay can detect is  250 copies / mL. A negative result does not preclude SARS-CoV-2 infection and should not be used as the sole basis for treatment or other patient management decisions.  A negative result may occur with improper specimen collection / handling, submission of specimen other than nasopharyngeal swab, presence of viral mutation(s) within the areas targeted by this assay, and inadequate number of viral copies (<250 copies / mL). A negative result must be combined with clinical observations, patient history, and epidemiological information.  Fact Sheet for Patients:   StrictlyIdeas.no  Fact Sheet for Healthcare Providers: BankingDealers.co.za  This test is not yet approved or  cleared by the [place] FDA and has been authorized for detection and/or diagnosis of SARS-CoV-2 by FDA under an Emergency Use Authorization (EUA).  This EUA will remain in effect (meaning this test can be used) for the duration of the COVID-19 declaration under Section 564(b)(1) of the  Act, 21 U.S.C. section 360bbb-3(b)(1), unless the authorization is terminated or revoked sooner.  Performed at [facility], [place] [address] [place], [place] [address]   Urine culture     Status: Abnormal   Collection Time: 07/18/20  7:06 PM   Specimen: In/Out Cath Urine  Result Value Ref [place] Status   Specimen Description   Final    IN/OUT CATH URINE Performed at [place] [address] [place], [place] [address]    Special Requests   Final    NONE Performed at [facility], [place] [address] [place],  [address]    Culture MULTIPLE SPECIES PRESENT, SUGGEST RECOLLECTION (A)  Final   Report Status 07/20/2020 FINAL  Final     Labs: BNP (last 3 results) No results for input(s): BNP in the last 8760 hours. Basic Metabolic Panel: Recent Labs  Lab 07/18/20 1413 07/20/20 0950 07/21/20 0055 07/22/20 0353   NA 139 141 142 143  K 4.0 3.8 4.2 4.0  CL 103 109 111 114*  CO2 24 21* 22 20*  GLUCOSE 187* 163* 119* 154*  BUN 66* 49* 47* 41*  CREATININE 2.65* 2.04* 1.88* 1.55*  CALCIUM 10.2 9.9 9.7 9.7  MG  --   --  2.2  --   PHOS  --   --  2.6  --    Liver Function Tests: Recent Labs  Lab 07/18/20 1413 07/21/20 0055  AST 27 35  ALT 20 16  ALKPHOS 52 39  BILITOT 1.1 0.8  PROT 7.0 5.8*  ALBUMIN 3.7 2.6*   No results for input(s): LIPASE, AMYLASE in the last 168 hours. No results for input(s): AMMONIA in the last 168 hours. CBC: Recent Labs  Lab 07/18/20 1413 07/21/20 0055  WBC 11.8* 6.7  NEUTROABS 10.3*  --   HGB 11.4* 10.0*  HCT 34.5* 31.0*  MCV 95.0 94.2  PLT 123* 123*   Cardiac Enzymes: No results for input(s): CKTOTAL, CKMB, CKMBINDEX, TROPONINI in the last 168 hours. BNP: Invalid input(s): POCBNP CBG: Recent Labs  Lab 07/21/20 1148 07/21/20 1545 07/21/20 2140 07/22/20 0642 07/22/20 1145  GLUCAP 145* 154* 166* 127* 171*   D-Dimer No results for input(s): DDIMER in the last 72 hours. Hgb A1c No results for input(s): HGBA1C in the last 72 hours. Lipid Profile No results for input(s): CHOL, HDL, LDLCALC, TRIG, CHOLHDL, LDLDIRECT in the last 72 hours. Thyroid function studies No results for input(s): TSH, T4TOTAL, T3FREE, THYROIDAB in the last 72 hours.  Invalid input(s): FREET3 Anemia work up No results for input(s): VITAMINB12, FOLATE, FERRITIN, TIBC, IRON, RETICCTPCT in the last 72 hours. Urinalysis    Component Value Date/Time   COLORURINE YELLOW 07/18/2020 1906   APPEARANCEUR CLEAR 07/18/2020 1906   LABSPEC 1.008 07/18/2020 1906   PHURINE 6.0 07/18/2020 1906   GLUCOSEU NEGATIVE 07/18/2020 1906   HGBUR NEGATIVE 07/18/2020 1906   BILIRUBINUR NEGATIVE 07/18/2020 1906   BILIRUBINUR negative 05/15/2019 [place] 07/18/2020 1906   PROTEINUR NEGATIVE 07/18/2020 1906   UROBILINOGEN 0.2 05/15/2019 1029   UROBILINOGEN 0.2 01/08/2011 1558    NITRITE NEGATIVE 07/18/2020 1906   LEUKOCYTESUR NEGATIVE 07/18/2020 1906   Sepsis Labs Invalid input(s): PROCALCITONIN,  WBC,  LACTICIDVEN Microbiology Recent Results (from the past 240 hour(s))  Blood Culture (routine x 2)     Status: None (Preliminary result)   Collection Time: 07/18/20  2:12 PM   Specimen: BLOOD  Result Value [place] Status   Specimen Description  Final    BLOOD RIGHT ANTECUBITAL Performed at [facility], [address], [place], [place] [address]    Special Requests   Final    BOTTLES DRAWN AEROBIC AND ANAEROBIC Blood Culture results may not be optimal due to an excessive volume of blood received in culture bottles Performed at [facility], [address], [place], [place] [address]    Culture   Final    NO GROWTH 4 DAYS Performed at [facility], [address], [place], [place] [address]    Report Status PENDING  Incomplete  Blood Culture (routine x 2)     Status: None (Preliminary result)   Collection Time: 07/18/20  2:13 PM   Specimen: BLOOD  Result Value Ref [place] Status   Specimen Description   Final    BLOOD LEFT ANTECUBITAL Performed at [facility], [address] [place], [place] [address]    Special Requests   Final    BOTTLES DRAWN AEROBIC AND ANAEROBIC Blood Culture adequate volume Performed at [facility], [address], [place], [place] [address]    Culture   Final    NO GROWTH 4 DAYS Performed at [facility], [address], [place], [place] [address]    Report Status PENDING  Incomplete  SARS Coronavirus 2 by RT PCR (hospital order, performed in [facility] hospital lab) Nasopharyngeal Nasopharyngeal Swab     Status: None   Collection Time: 07/18/20  2:20 PM   Specimen: Nasopharyngeal Swab  Result Value Ref [place] Status   SARS Coronavirus 2 NEGATIVE NEGATIVE Final    Comment: (NOTE) SARS-CoV-2 target nucleic acids are NOT  DETECTED.  The SARS-CoV-2 RNA is generally detectable in upper and lower respiratory specimens during the acute phase of infection. The lowest concentration of SARS-CoV-2 viral copies this assay can detect is 250 copies / mL. A negative result does not preclude SARS-CoV-2 infection and should not be used as the sole basis for treatment or other patient management decisions.  A negative result may occur with improper specimen collection / handling, submission of specimen other than nasopharyngeal swab, presence of viral mutation(s) within the areas targeted by this assay, and inadequate number of viral copies (<250 copies / mL). A negative result must be combined with clinical observations, patient history, and epidemiological information.  Fact Sheet for Patients:   BoilerBrush.com.cy  Fact Sheet for Healthcare Providers: https://pope.com/  This test is not yet approved or  cleared by the [place] FDA and has been authorized for detection and/or diagnosis of SARS-CoV-2 by FDA under an Emergency Use Authorization (EUA).  This EUA will remain in effect (meaning this test can be used) for the duration of the COVID-19 declaration under Section 564(b)(1) of the Act, 21 U.S.C. section 360bbb-3(b)(1), unless the authorization is terminated or revoked sooner.  Performed at [facility], [address] [place], [place] [address]   Urine culture     Status: Abnormal   Collection Time: 07/18/20  7:06 PM   Specimen: In/Out Cath Urine  Result Value Ref [place] Status   Specimen Description   Final    IN/OUT CATH URINE Performed at [facility], [address], [place], [place] [address]    Special Requests   Final    NONE Performed at [facility], [address], [place], [place] [address]    Culture MULTIPLE SPECIES PRESENT, SUGGEST RECOLLECTION (A)  Final  Report Status  07/20/2020 FINAL  Final    Time spent: 30 min  SIGNED:   Marylu Lund, MD  [facility] 07/22/2020, 2:24 PM  If 7PM-7AM, please contact night-coverage

## 2020-07-22 NOTE — Care Management Important Message (Signed)
Important Message  Patient Details  Name: Micheal Walls MRN: [id number] Date of Birth: [birth year]   Medicare Important Message Given:  Yes     Orbie Pyo 07/22/2020, 3:27 PM

## 2020-07-22 NOTE — TOC Transition Note (Signed)
Transition of Care [facility]) - CM/SW Discharge Note   Patient Details  Name: Micheal Walls MRN: [id number] Date of Birth: [birth year]  Transition of Care [facility]) CM/SW Contact:  Pollie Friar, RN Phone Number: 07/22/2020, 2:35 PM   Clinical Narrative:    Pt discharging home with Home health services through [place]. Cory with [facility] accepted the referral. Wide walker delivered to the room per [employer].  Pt has supervision at home and transportation to home.   Final next level of care: Home w Home Health Services Barriers to Discharge: No Barriers Identified   Patient Goals and CMS Choice   CMS Medicare.gov Compare Post Acute Care list provided to:: Patient Choice offered to / list presented to : Patient  Discharge Placement                       Discharge Plan and Services                DME Arranged: [place] Rile wide DME Agency: [employer] Date DME Agency Contacted: 07/21/20   Representative spoke with at DME Agency: [employer]: PT,OT [facility] Agency: [place] Date [place]: 07/22/20   Representative spoke with at [place]: [place] ([place]) Interventions     Readmission Risk Interventions No flowsheet data found.

## 2020-07-23 ENCOUNTER — Telehealth: Payer: Self-pay

## 2020-07-23 DIAGNOSIS — M19072 Primary osteoarthritis, left ankle and foot: Secondary | ICD-10-CM | POA: Diagnosis not present

## 2020-07-23 DIAGNOSIS — G9341 Metabolic encephalopathy: Secondary | ICD-10-CM | POA: Diagnosis not present

## 2020-07-23 DIAGNOSIS — Z7982 Long term (current) use of aspirin: Secondary | ICD-10-CM | POA: Diagnosis not present

## 2020-07-23 DIAGNOSIS — K5790 Diverticulosis of intestine, part unspecified, without perforation or abscess without bleeding: Secondary | ICD-10-CM | POA: Diagnosis not present

## 2020-07-23 DIAGNOSIS — H9190 Unspecified hearing loss, unspecified ear: Secondary | ICD-10-CM | POA: Diagnosis not present

## 2020-07-23 DIAGNOSIS — M1030 Gout due to renal impairment, unspecified site: Secondary | ICD-10-CM | POA: Diagnosis not present

## 2020-07-23 DIAGNOSIS — R000 Tachycardia, unspecified: Secondary | ICD-10-CM | POA: Diagnosis not present

## 2020-07-23 DIAGNOSIS — H409 Unspecified glaucoma: Secondary | ICD-10-CM | POA: Diagnosis not present

## 2020-07-23 DIAGNOSIS — D631 Anemia in chronic kidney disease: Secondary | ICD-10-CM | POA: Diagnosis not present

## 2020-07-23 DIAGNOSIS — K219 Gastro-esophageal reflux disease without esophagitis: Secondary | ICD-10-CM | POA: Diagnosis not present

## 2020-07-23 DIAGNOSIS — I4510 Unspecified right bundle-branch block: Secondary | ICD-10-CM | POA: Diagnosis not present

## 2020-07-23 DIAGNOSIS — I878 Other specified disorders of veins: Secondary | ICD-10-CM | POA: Diagnosis not present

## 2020-07-23 DIAGNOSIS — Z9981 Dependence on supplemental oxygen: Secondary | ICD-10-CM | POA: Diagnosis not present

## 2020-07-23 DIAGNOSIS — D509 Iron deficiency anemia, unspecified: Secondary | ICD-10-CM | POA: Diagnosis not present

## 2020-07-23 DIAGNOSIS — I130 Hypertensive heart and chronic kidney disease with heart failure and stage 1 through stage 4 chronic kidney disease, or unspecified chronic kidney disease: Secondary | ICD-10-CM | POA: Diagnosis not present

## 2020-07-23 DIAGNOSIS — G319 Degenerative disease of nervous system, unspecified: Secondary | ICD-10-CM | POA: Diagnosis not present

## 2020-07-23 DIAGNOSIS — E785 Hyperlipidemia, unspecified: Secondary | ICD-10-CM | POA: Diagnosis not present

## 2020-07-23 DIAGNOSIS — E1122 Type 2 diabetes mellitus with diabetic chronic kidney disease: Secondary | ICD-10-CM | POA: Diagnosis not present

## 2020-07-23 DIAGNOSIS — E1140 Type 2 diabetes mellitus with diabetic neuropathy, unspecified: Secondary | ICD-10-CM | POA: Diagnosis not present

## 2020-07-23 DIAGNOSIS — G4733 Obstructive sleep apnea (adult) (pediatric): Secondary | ICD-10-CM | POA: Diagnosis not present

## 2020-07-23 DIAGNOSIS — E113553 Type 2 diabetes mellitus with stable proliferative diabetic retinopathy, bilateral: Secondary | ICD-10-CM | POA: Diagnosis not present

## 2020-07-23 DIAGNOSIS — A419 Sepsis, unspecified organism: Secondary | ICD-10-CM | POA: Diagnosis not present

## 2020-07-23 DIAGNOSIS — N179 Acute kidney failure, unspecified: Secondary | ICD-10-CM | POA: Diagnosis not present

## 2020-07-23 DIAGNOSIS — I5032 Chronic diastolic (congestive) heart failure: Secondary | ICD-10-CM | POA: Diagnosis not present

## 2020-07-23 DIAGNOSIS — N184 Chronic kidney disease, stage 4 (severe): Secondary | ICD-10-CM | POA: Diagnosis not present

## 2020-07-23 LAB — CULTURE, BLOOD (ROUTINE X 2)
Culture: NO GROWTH
Culture: NO GROWTH
Special Requests: ADEQUATE

## 2020-07-23 NOTE — Telephone Encounter (Signed)
Transition Care Management Unsuccessful Follow-up Telephone Call  Date of discharge and from where:  07/22/2020 [facility]  Attempts:  1st Attempt  Reason for unsuccessful TCM follow-up call:  Left voice message

## 2020-07-24 ENCOUNTER — Telehealth: Payer: Self-pay

## 2020-07-24 ENCOUNTER — Telehealth (HOSPITAL_COMMUNITY): Payer: Self-pay

## 2020-07-24 NOTE — Telephone Encounter (Signed)
Transition Care Management Unsuccessful Follow-up Telephone Call  Date of discharge and from where:  [facility]/2020 [facility]  Attempts:  2nd Attempt  Reason for unsuccessful TCM follow-up call:  Left voice message

## 2020-07-24 NOTE — Telephone Encounter (Signed)
Telephone call to assess patient status post discharge of hospitalization. Approved contact Elvia Collum (goddaughter) states patient is doing well. He continues on his antibiotics as ordered from the hospital. Goddaughter says she is unsure if [facility] has started yet or if he has a follow up appointment scheduled yet with his PCP or podiatry but that she will be going to see patient and will inquire and call Navigator back. Goddaughter has contact information for Navigator.

## 2020-07-27 ENCOUNTER — Telehealth: Payer: Medicare Other

## 2020-07-27 ENCOUNTER — Other Ambulatory Visit: Payer: Self-pay | Admitting: *Deleted

## 2020-07-27 ENCOUNTER — Ambulatory Visit: Payer: Self-pay

## 2020-07-27 DIAGNOSIS — I70299 Other atherosclerosis of native arteries of extremities, unspecified extremity: Secondary | ICD-10-CM

## 2020-07-27 DIAGNOSIS — L97909 Non-pressure chronic ulcer of unspecified part of unspecified lower leg with unspecified severity: Secondary | ICD-10-CM

## 2020-07-27 DIAGNOSIS — I739 Peripheral vascular disease, unspecified: Secondary | ICD-10-CM

## 2020-07-27 NOTE — Patient Outreach (Signed)
[facility]) Care Management  07/27/2020  CHISUM HABENICHT 07-03-1935 [id number]   RED ON EMMI ALERT - Stroke Day # 3 Date: 2/13 Red Alert Reason: Other questions/problems with medications   Outreach attempt #1, successful.  Identity verified.  This care manager introduced self and stated purpose of call.  [facility] care management services explained.    Member report he is doing well, lives with his wife who is supportive in his recovery.  Daughter is also active in his health management.  He is independent, uses a wheelchair for mobility.  He was questioning his Plavix and Augmentin that he was discharged on.  Instructions provided per discharge note, encouraged to take all Augmentin until out to promote recovery from infection.  He has follow up appointments with wound care on 2/15, with vascular on 3/2, and with ID on 3/2.  Also has appointment with PCP on 3/8.  Uses [facility] for transportation.  Does not have office visit scheduled with neurology yet, offered to place conference call today, he declines, state he will call on his own time.  He has not have [facility] start services yet, state his daughter has reached out to PCP office to change from [place] to [place] as he was using them prior to hospitalization.    Denies any further questions, state all needs are met.  He does agree to follow up for reassessment of needs.  Plan: RN CM will send outreach letter and this care manager's contact information.  Will follow up within the next week to inquire about ongoing [facility] services.  If agrees, will complete assessment including individualized care plan at that time.  Valente David, [place], MSN [place] [phone]

## 2020-07-27 NOTE — Chronic Care Management (AMB) (Signed)
Care Management    RN Visit Note  07/27/2020 Name: Micheal Walls MRN: [id number] DOB: 08/04/1935  Subjective: Micheal Walls is a 85 y.o. year old male who is a primary care patient of Micheal Felts, [name]. The care management team was consulted for assistance with disease management and care coordination needs.    Collaboration with Micheal Walls [name]  for Case Collaboration  in response to provider referral for case management and/or care coordination services.   Assessment: Review of patient past medical history, allergies, medications, health status, including review of consultants reports, laboratory and other test data, was performed as part of comprehensive evaluation and provision of chronic care management services.   SDOH (Social Determinants of Health) assessments and interventions performed:  No  Received the following RED EMMI Alert;   Yesterday's Red Flags: 1   STROKE [facility]-3W PROGRESSIVE CARE  Encounter ID: [id number] Patient ID: [id number]  Series Access Code: [id number] Enrolled Date: 07/23/20  Call Language: English Start Date: 07/24/20  Details Sun 07/26/20 Fri 07/24/20  [phone] Day # 3 Day # 1  PATIENT STROKE ANTIPLATELETS AND ANTICOAGULANTS STROKE FOLLOW-UP AND MEDICATION  Time of Last Call Attempt 4:01 PM 1:01 PM  Who reached Patient Patient  Questions/problems with meds? Yes No  Emmi Program: STROKE - PREVENTING SECONDARY STROKE   Issued    Per chart review in preparation to contact patient, noted Micheal Walls is not currently enrolled in the embedded CCM program. He was contacted and offered enrollment on 01/21/20 but declined services at that time. Noted [facility] Case Manager Micheal Durie, RN completed a successful call to Micheal Walls earlier today to follow up on this RED EMMI Alert.   Collaboration with embedded [name] Micheal Walls via email requesting outreach to Micheal Walls to offer CCM services at her next available appointment at which time if he agrees this RN  CM will follow up with Micheal Walls to assess for chronic care needs and establish a CCM plan of care.   Allergies  Allergen Reactions  . Vancomycin Rash    Outpatient Encounter Medications as of 07/27/2020  Medication Sig  . amoxicillin-clavulanate (AUGMENTIN) 875-125 MG tablet Take 1 tablet by mouth every 12 (twelve) hours for 6 days.  Marland Kitchen aspirin EC 81 MG tablet Take 81 mg by mouth daily. Swallow whole.  . Biotin 5000 MCG TABS Take 5,000 mcg by mouth daily.  . clopidogrel (PLAVIX) 75 MG tablet Take 1 tablet (75 mg total) by mouth daily.  . colchicine 0.6 MG tablet Take 0.6 mg by mouth daily as needed (gout flare up).  . Continuous Blood Gluc Sensor (FREESTYLE LIBRE 14 DAY SENSOR) MISC Use as directed to check blood sugars  . diltiazem (CARDIZEM CD) 300 MG 24 hr capsule Take 1 capsule (300 mg total) by mouth daily.  . dorzolamide (TRUSOPT) 2 % ophthalmic solution Place 1 drop into both eyes 2 (two) times daily.  Marland Kitchen doxycycline (VIBRA-TABS) 100 MG tablet Take 1 tablet (100 mg total) by mouth every 12 (twelve) hours for 6 days.  . ferrous sulfate 325 (65 FE) MG tablet Take 325 mg by mouth every Monday, Wednesday, and Friday.  . fish oil-omega-3 fatty acids 1000 MG capsule Take 2 g by mouth daily.  . furosemide (LASIX) 40 MG tablet Take 2 tabs in morning and 1 tab evening (Patient taking differently: Take 80 mg by mouth daily.)  . gabapentin (NEURONTIN) 100 MG capsule Take 1 capsule by mouth 3 times a day (Patient taking differently:  Take 300 mg by mouth daily.)  . glucose blood (FREESTYLE PRECISION NEO TEST) test strip Use as instructed  . hydrocortisone (ANUSOL-HC) 25 MG suppository Place 25 mg rectally 2 (two) times daily as needed for hemorrhoids or anal itching.  . latanoprost (XALATAN) 0.005 % ophthalmic solution Place 1 drop into both eyes at bedtime.   Marland Kitchen losartan (COZAAR) 100 MG tablet Take 1/2 tablet by mouth at bedtime (New directions) (Patient taking differently: Take 50 mg by mouth at  bedtime.)  . Multiple Vitamin (MULTIVITAMIN WITH MINERALS) TABS tablet Take 1 tablet by mouth daily.  . pantoprazole (PROTONIX) 40 MG tablet Take 1 tablet  by mouth daily. (Patient taking differently: Take 40 mg by mouth at bedtime.)  . Polyvinyl Alcohol-Povidone (REFRESH OP) Place 1 drop into both eyes daily as needed (dry eyes).  Marland Kitchen PRESCRIPTION MEDICATION Inhale into the lungs See admin instructions. CPAP- At bedtime  . rosuvastatin (CRESTOR) 10 MG tablet Take 1 tablet (10 mg total) by mouth daily. (Patient taking differently: Take 10 mg by mouth every Monday, Wednesday, and Friday.)  . Semaglutide, [name], (OZEMPIC, [name],) 2 MG/1.5ML SOPN Inject 1 mg into the skin once a week. (Patient taking differently: Inject 1 mg into the skin every Wednesday.)   No facility-administered encounter medications on file as of 07/27/2020.    Patient Active Problem List   Diagnosis Date Noted  . Diabetic foot infection ([place])   . Cerebral thrombosis with cerebral infarction 07/20/2020  . Sepsis ([place]) 07/19/2020  . Thrombocytopenia ([place]) 07/19/2020  . Cerebral ischemia 07/18/2020  . Bilateral hearing loss 05/20/2020  . Does use hearing aid 05/20/2020  . Diabetic ulcer of left great toe ([place]) 01/16/2020  . Controlled type 2 diabetes mellitus with stable proliferative retinopathy of both eyes, with long-term current use of insulin ([place]) 01/06/2020  . Intermediate stage nonexudative age-related macular degeneration of both eyes 01/06/2020  . Left epiretinal membrane 01/06/2020  . [place] of right macula 01/06/2020  . AKI (acute kidney injury) ([place]) 11/02/2019  . Cellulitis 11/02/2019  . Elevated TSH 07/10/2018  . Nephropathy 02/28/2018  . Disturbance of skin sensation 02/28/2018  . CKD (chronic kidney disease) stage 4, GFR 15-29 ml/min (HCC) 07/17/2017  . PAD (peripheral artery disease) ([place]) 07/17/2017  . 110.1 10/21/2013  . Epiphora 04/24/2013  . Laxity of eyelid 04/24/2013  . Anemia  04/09/2013  . History of peripheral vascular disease 04/09/2013  . History of stroke 04/09/2013  . S/P unilateral BKA (below knee amputation) ([place]) 03/17/2013  . Diabetes mellitus type 2 in obese ([place]) 03/17/2013  . Mixed hyperlipidemia 03/17/2013  . Essential hypertension 03/17/2013  . Gout 03/17/2013  . CKD (chronic kidney disease), stage III ([place]) 03/17/2013  . OSA on CPAP 03/17/2013  . Anemia, iron deficiency 03/17/2013  . Diverticulosis of colon 03/17/2013  . RBBB 03/17/2013  . Punctal stenosis, acquired 10/02/2012    Conditions to be addressed/monitored: Essential Hypertension, CKD stage IV, PAD, AKI, Cerebral ischemia, Sepsis, Thrombocytopenia, Cerebral thrombosis with cerebral infarction, Diabetic foot infection   There are no care plans that you recently modified to display for this patient.   Plan: The care management team will reach out to the patient again over the next 7-14 days.  Barb Merino, RN, BSN, CCM Care Management Coordinator [facility]  Direct Phone: [phone]

## 2020-07-28 ENCOUNTER — Other Ambulatory Visit: Payer: Self-pay

## 2020-07-28 ENCOUNTER — Telehealth (HOSPITAL_COMMUNITY): Payer: Self-pay

## 2020-07-28 ENCOUNTER — Encounter (HOSPITAL_BASED_OUTPATIENT_CLINIC_OR_DEPARTMENT_OTHER): Payer: Medicare Other | Admitting: Internal Medicine

## 2020-07-28 DIAGNOSIS — E1140 Type 2 diabetes mellitus with diabetic neuropathy, unspecified: Secondary | ICD-10-CM | POA: Diagnosis not present

## 2020-07-28 DIAGNOSIS — N186 End stage renal disease: Secondary | ICD-10-CM | POA: Diagnosis not present

## 2020-07-28 DIAGNOSIS — I509 Heart failure, unspecified: Secondary | ICD-10-CM | POA: Diagnosis not present

## 2020-07-28 DIAGNOSIS — I132 Hypertensive heart and chronic kidney disease with heart failure and with stage 5 chronic kidney disease, or end stage renal disease: Secondary | ICD-10-CM | POA: Diagnosis not present

## 2020-07-28 DIAGNOSIS — E1122 Type 2 diabetes mellitus with diabetic chronic kidney disease: Secondary | ICD-10-CM | POA: Diagnosis not present

## 2020-07-28 DIAGNOSIS — Z8673 Personal history of transient ischemic attack (TIA), and cerebral infarction without residual deficits: Secondary | ICD-10-CM | POA: Diagnosis not present

## 2020-07-28 DIAGNOSIS — Z89511 Acquired absence of right leg below knee: Secondary | ICD-10-CM | POA: Diagnosis not present

## 2020-07-28 DIAGNOSIS — I890 Lymphedema, not elsewhere classified: Secondary | ICD-10-CM | POA: Diagnosis not present

## 2020-07-28 DIAGNOSIS — Z833 Family history of diabetes mellitus: Secondary | ICD-10-CM | POA: Diagnosis not present

## 2020-07-28 DIAGNOSIS — E11621 Type 2 diabetes mellitus with foot ulcer: Secondary | ICD-10-CM | POA: Diagnosis not present

## 2020-07-28 DIAGNOSIS — E1151 Type 2 diabetes mellitus with diabetic peripheral angiopathy without gangrene: Secondary | ICD-10-CM | POA: Diagnosis not present

## 2020-07-28 DIAGNOSIS — L97522 Non-pressure chronic ulcer of other part of left foot with fat layer exposed: Secondary | ICD-10-CM | POA: Diagnosis not present

## 2020-07-28 DIAGNOSIS — I872 Venous insufficiency (chronic) (peripheral): Secondary | ICD-10-CM | POA: Diagnosis not present

## 2020-07-28 DIAGNOSIS — Z8249 Family history of ischemic heart disease and other diseases of the circulatory system: Secondary | ICD-10-CM | POA: Diagnosis not present

## 2020-07-28 NOTE — Telephone Encounter (Signed)
Pharmacy Transitions of Care Follow-up Telephone Call  Date of discharge: 07/22/20 Discharge Diagnosis: AKI,CVA  How have you been since you were released from the hospital? Patient says he's been doing fine. He's almost done with course of antibiotics. Knows s/sx of bleeding to watch for  Medication changes made at discharge: yes  Medication changes obtained and verified? yes    Medication Accessibility:  Home Pharmacy: not discussed. No refills on file but has follow up scheduled at internal medicine so he can get refills at that visit sent to home pharmacy.    Medication Review:  CLOPIDOGREL (PLAVIX) Clopidogrel 75 mg once daily.  - Educated patient on expected duration of therapy of  with clopidogrel. Advised patient that aspirin will be continued indefinitely.  - Advised patient of medications to avoid (NSAIDs, ASA)  - Educated that Tylenol (acetaminophen) will be the preferred analgesic to prevent risk of bleeding  - Emphasized importance of monitoring for signs and symptoms of bleeding (abnormal bruising, prolonged bleeding, nose bleeds, bleeding from gums, discolored urine, black tarry stools)  - Advised patient to alert all providers of anticoagulation therapy prior to starting a new medication or having a procedure    Follow-up Appointments:  Patient has follow up with internal medicine on 08/18/20 with [name]. Has infectious disease f/u on 3/2. Has already had f/u with wound care and IM since his discharge   If their condition worsens, is the pt aware to call PCP or go to the Emergency Dept.? yes  Final Patient Assessment: Patient is doing well. Has follow ups scheduled and know to get refills at next follow up.

## 2020-07-28 NOTE — Progress Notes (Incomplete)
n

## 2020-07-31 DIAGNOSIS — E113553 Type 2 diabetes mellitus with stable proliferative diabetic retinopathy, bilateral: Secondary | ICD-10-CM | POA: Diagnosis not present

## 2020-07-31 DIAGNOSIS — I5030 Unspecified diastolic (congestive) heart failure: Secondary | ICD-10-CM | POA: Diagnosis not present

## 2020-07-31 DIAGNOSIS — Z7982 Long term (current) use of aspirin: Secondary | ICD-10-CM | POA: Diagnosis not present

## 2020-07-31 DIAGNOSIS — I130 Hypertensive heart and chronic kidney disease with heart failure and stage 1 through stage 4 chronic kidney disease, or unspecified chronic kidney disease: Secondary | ICD-10-CM | POA: Diagnosis not present

## 2020-07-31 DIAGNOSIS — Z79899 Other long term (current) drug therapy: Secondary | ICD-10-CM | POA: Diagnosis not present

## 2020-07-31 DIAGNOSIS — A419 Sepsis, unspecified organism: Secondary | ICD-10-CM | POA: Diagnosis not present

## 2020-07-31 DIAGNOSIS — N184 Chronic kidney disease, stage 4 (severe): Secondary | ICD-10-CM | POA: Diagnosis not present

## 2020-07-31 DIAGNOSIS — R531 Weakness: Secondary | ICD-10-CM | POA: Diagnosis not present

## 2020-07-31 DIAGNOSIS — I69398 Other sequelae of cerebral infarction: Secondary | ICD-10-CM | POA: Diagnosis not present

## 2020-07-31 DIAGNOSIS — E1122 Type 2 diabetes mellitus with diabetic chronic kidney disease: Secondary | ICD-10-CM | POA: Diagnosis not present

## 2020-07-31 DIAGNOSIS — Z9181 History of falling: Secondary | ICD-10-CM | POA: Diagnosis not present

## 2020-07-31 DIAGNOSIS — Z7902 Long term (current) use of antithrombotics/antiplatelets: Secondary | ICD-10-CM | POA: Diagnosis not present

## 2020-07-31 DIAGNOSIS — E1151 Type 2 diabetes mellitus with diabetic peripheral angiopathy without gangrene: Secondary | ICD-10-CM | POA: Diagnosis not present

## 2020-07-31 NOTE — Progress Notes (Signed)
Micheal Walls, Micheal Walls ([id number]) Visit Report for 07/28/2020 Debridement Details Patient Name: Date of Service: Micheal Walls, Micheal Walls 07/28/2020 9:00 A M Medical Record Number: [id number] Patient Account Number: [id number] Date of Birth/Sex: Treating RN: 1936/03/04 (85 y.o. [name], [name] Primary Care Provider: [name] Other Clinician: Referring Provider: Treating Provider/Extender: Lynden Ang in Treatment: 16 Debridement Performed for Assessment: Wound #2 Left,Plantar T Great oe Performed By: Physician Ricard Dillon., MD Debridement Type: Debridement Severity of Tissue Pre Debridement: Fat layer exposed Level of Consciousness (Pre-procedure): Awake and Alert Pre-procedure Verification/Time Out Yes - 08:49 Taken: Start Time: 08:49 Pain Control: Lidocaine T Area Debrided (L x W): otal 1.5 (cm) x 1.5 (cm) = 2.25 (cm) Tissue and other material debrided: Viable, Non-Viable, Slough, Skin: Dermis , Skin: Epidermis, Slough Level: Skin/Epidermis Debridement Description: Selective/Open Wound Instrument: Curette Bleeding: Minimum Hemostasis Achieved: Pressure End Time: 08:50 Procedural Pain: 0 Post Procedural Pain: 0 Response to Treatment: Procedure was tolerated well Level of Consciousness (Post- Awake and Alert procedure): Post Debridement Measurements of Total Wound Length: (cm) 1.5 Width: (cm) 1.5 Depth: (cm) 0.1 Volume: (cm) 0.177 Character of Wound/Ulcer Post Debridement: Improved Severity of Tissue Post Debridement: Fat layer exposed Post Procedure Diagnosis Same as Pre-procedure Electronic Signature(s) Signed: 07/28/2020 5:22:09 PM By: Linton Ham MD Signed: 07/31/2020 5:09:47 PM By: Rhae Hammock RN Entered By: Linton Ham on 07/28/2020 08:55:58 -------------------------------------------------------------------------------- HPI Details Patient Name: Date of Service: Micheal Quails D. 07/28/2020 9:00 A M Medical  Record Number: [id number] Patient Account Number: [id number] Date of Birth/Sex: Treating RN: [birth year] (85 y.o. [name] Primary Care Provider: [name] Other Clinician: Referring Provider: Treating Provider/Extender: Lynden Ang in Treatment: 16 History of Present Illness HPI Description: 02/19/2020 upon evaluation today patient presents today for initial evaluation in clinic concerning issues that has been having with his bilateral lower extremities. He apparently also had a wound on his great toe. With that being said he does have a history of diabetes mellitus type 2, lymphedema, peripheral vascular disease, congestive heart failure, hypertension, right below-knee amputation, and chronic kidney disease stage IV according to records reviewed with his creatinine clearance being below 30. With that being said the legs have been actually worse than what they are currently he does not seem to have any open wounds at this point significantly he does have a small leaking area on the left leg at this point. With that being said the majority of what I see is a lot of skin changes secondary to the chronic lymphedema to be honest. There does not appear to be any signs of systemic infection or local infection based on what I see today. The patient is doing much better with regard to the wound on his toe as well according to the daughter. He has been seeing podiatry. They do have questions about whether or not he can perform physical therapy to have no limitations on physical therapy with regard to his wounds to be honest. With that being said he questions having some pain in the ankle region which again I am not really the specialist to comment on that therefore I recommended that he probably needs to discuss this with his podiatrist. 02/26/2020 upon evaluation today patient appears to actually be doing extremely well he is completely healed and overall is looking  excellent. They did get the compression stockings ordered yesterday so they should be on the way hopefully arriving if not tomorrow than Friday. In the meantime we  may just utilize a Ace wrap to try to keep the edema under as much control as possible until he gets his compression stockings. READMISSION 04/03/2021 This is an 85 year old man we had for 2 visits in the clinic in September seen by Jeri Cos. Patient has chronic venous insufficiency with secondary lymphedema. He had wounds on the left anterior leg these healed out. He is also known to Dr. Doren Custard last seen in August 2020. He is known to have an occluded PTA with monophasic waveforms has an 8 occluded tibioperoneal trunk. At the time he was not felt to be a candidate for angiography because of his stage IV chronic renal failure and his wounds were closed. If he was to require angiography he would need a CO2 angiogram. His last arterial studies were in August 2020 as well. He had triphasic waveforms down to the mid popliteal biphasic distally and then monophasic at the dorsalis of the tibioperoneal trunk, ATA occluded PTA monophasic distal PTA his peroneal was not visualized. He wears compression stockings but did not bring 1 in today He thinks the wound currently we are looking at started about 2 weeks ago. He thinks it is because of abrasion on his foot wear. He has not been wearing his shoe he switched into a left surgical sandal. He has been cleaning this with saline and wrapping it with gauze. ABI in our clinic was 0.52. Please visit previous arterial work-up by Dr. Doren Custard in August 2020 as noted. 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 We use silver collagen. He has known PAD and very significant lymphedema. I have not ordered arterial studies or vascular consult  unless this area worsens 11/12; this is a patient with a punched-out wound over the interphalangeal joint of his left great toe dorsally. He has severe PAD which is not listed is being amenable to revascularization or angiography because of his stage IV chronic renal failure. We have been using collagen 12/3; punched-out area over the inner phalangeal joint of the left great toe in the setting of known PAD. He was not felt previously to be a good revascularization candidate because of renal insufficiency. We have been using collagen. The patient states he noticed this draining about 2 to 3 days ago. When he arrived in clinic our intake nurse noted purulent drainage and wound a lot larger especially in depth of 0.7 cm 12/10; patient with a wound on the dorsal part in the inner phalangeal joint of the left great toe. This deteriorated quite a bit the last time he was here. A culture I did showed Staphylococcus lugdunensis. I put him on doxycycline and I will extend this today. X-ray showed no evidence of osteomyelitis. Really not any better today there is exposed bone at the bottom of this undermining that is hard to measure 12/17; is a patient with a wound on the dorsal part of the inter phalangeal joint of his left great toe. I have him on antibiotics and I gave him a 2-week course of doxycycline this seems to have close down the wound quite a bit and there is no longer palpable bone. 12/28; dorsal part of the interphalangeal joint of the left great toe. There is no palpable bone which is improvement. He completed antibiotics. Has been using silver collagen 1/11; dorsal part of the interphalangeal joint  of the left great toe. Again today there was exposed bone. This is not particularly new but had not been obvious over the last 2 weeks. We have been using Hydrofera Blue. X-ray I did last month was negative for osteomyelitis 1/25; dorsal part of the inner phalangeal joint of the left great toe. BONE  SCRAPING for PCR I did last time was completely negative, no pathogens identified. We have been using Hydrofera Blue with no improvement 2/1; dorsal part of the interphalangeal joint. He also has a new area on the plantar part of the left great toe. We have not nearly been making much improvement here. He does have known PAD 2/15; since the patient was last here he was admitted to hospital from 07/18/2020 through 07/22/2020. He was initially felt to have a possible CVA right vertebral artery stenosis and acute kidney injury. Neurology recommended aspirin and Plavix. They noted the wound on the left great toe felt that he had sepsis secondary to a diabetic left foot infection. An MRI was ordered that showed abnormal marrow edema in the distal and proximal phalanx of the great toe centered at the interphalangeal joint the patient has an erosion at the head of the proximal phalanx of the great toe there was no joint effusion no other significant abnormalities. It was felt that this might represent either gout or osteomyelitis. The patient was seen by Dr. Gale Journey of infectious disease. He did receive ceftriaxone and Flagyl which was converted to Augmentin and doxycycline until today. He also has severe PAD. He has a follow-up with Dr. Gale Journey in early March on March 2 at 2 PM. At that point inflammatory markers assessment to see if he needs additional antibiotic therapy. Electronic Signature(s) Signed: 07/28/2020 5:22:09 PM By: Linton Ham MD Entered By: Linton Ham on 07/28/2020 08:58:51 -------------------------------------------------------------------------------- Physical Exam Details Patient Name: Date of Service: Micheal Quails D. 07/28/2020 9:00 A M Medical Record Number: [id number] Patient Account Number: [id number] Date of Birth/Sex: Treating RN: April 11, 1936 (85 y.o. [name] Primary Care Provider: [name] Other Clinician: Referring Provider: Treating Provider/Extender:  Lynden Ang in Treatment: 16 Constitutional Sitting or standing Blood Pressure is within target range for patient.. Pulse regular and within target range for patient.Marland Kitchen Respirations regular, non-labored and within target range.. Temperature is normal and within the target range for the patient.Marland Kitchen Appears in no distress. Notes Wound exam; open wound at the dorsal interphalangeal joint of the left great toe seems closed. On the plantar surface now he has a denuded area that was present last time. I remove this. There is no gross infection this all seems superficial. Electronic Signature(s) Signed: 07/28/2020 5:22:09 PM By: Linton Ham MD Entered By: Linton Ham on 07/28/2020 08:59:36 -------------------------------------------------------------------------------- Physician Orders Details Patient Name: Date of Service: Micheal Quails D. 07/28/2020 9:00 A M Medical Record Number: [id number] Patient Account Number: [id number] Date of Birth/Sex: Treating RN: [birth year] (85 y.o. [name] Primary Care Provider: [name] Other Clinician: Referring Provider: Treating Provider/Extender: Lynden Ang in Treatment: [phone] Verbal / Phone Orders: No Diagnosis Coding Follow-up Appointments Return Appointment in 1 week. Bathing/ Shower/ Hygiene May shower and wash wound with soap and water. Edema Control - Lymphedema / SCD / Other Bilateral Lower Extremities Elevate legs to the level of the heart or above for 30 minutes daily and/or when sitting, a frequency of: - throughout the day Avoid standing for long periods of time. Patient to wear own compression stockings every day. -  both legs Exercise regularly Moisturize legs daily. Off-Loading Wound #1 Left T Great oe Other: - use felt for offloading Wound #2 Left,Plantar T Great oe Other: - use felt for offloading Home Health Admit to [facility] for wound care. May utilize  formulary equivalent dressing for wound treatment orders unless otherwise specified. New wound care orders this week; continue Home Health for wound care. May utilize formulary equivalent dressing for wound treatment orders unless otherwise specified. - [facility] Wound Treatment Wound #1 - T Great oe Wound Laterality: Left Cleanser: Soap and Water Every Other Day/15 Days Discharge Instructions: May shower and wash wound with dial antibacterial soap and water prior to dressing change. Cleanser: Wound Cleanser Every Other Day/15 Days Discharge Instructions: Cleanse the wound with wound cleanser prior to applying a clean dressing using gauze sponges, not tissue or cotton balls. Cleanser: Normal Saline (Generic) Every Other Day/15 Days Discharge Instructions: Cleanse the wound with Normal Saline prior to applying a clean dressing using gauze sponges, not tissue or cotton balls. Prim Dressing: KerraCel Ag Gelling Fiber Dressing, 4x5 in (silver alginate) Every Other Day/15 Days ary Discharge Instructions: Apply silver alginate to wound bed as instructed Secondary Dressing: Woven Gauze Sponge, Non-Sterile 4x4 in (Generic) Every Other Day/15 Days Discharge Instructions: Apply over primary dressing as directed. Secondary Dressing: Woven Gauze Sponges 2x2 in (Generic) Every Other Day/15 Days Discharge Instructions: Apply over primary dressing as directed. Secondary Dressing: Optifoam Non-Adhesive Dressing, 4x4 in Every Other Day/15 Days Discharge Instructions: Use foam donut for offloading Secured With: Conforming Stretch Gauze Bandage, Sterile 2x75 (in/in) (Generic) Every Other Day/15 Days Discharge Instructions: Secure with stretch gauze as directed. Secured With: Paper Tape, 1x10 (in/yd) Every Other Day/15 Days Discharge Instructions: Secure dressing with tape as directed. Wound #2 - T Great oe Wound Laterality: Plantar, Left Cleanser: Soap and Water Every Other Day/15 Days Discharge  Instructions: May shower and wash wound with dial antibacterial soap and water prior to dressing change. Cleanser: Wound Cleanser Every Other Day/15 Days Discharge Instructions: Cleanse the wound with wound cleanser prior to applying a clean dressing using gauze sponges, not tissue or cotton balls. Cleanser: Normal Saline Every Other Day/15 Days Discharge Instructions: Cleanse the wound with Normal Saline prior to applying a clean dressing using gauze sponges, not tissue or cotton balls. Prim Dressing: KerraCel Ag Gelling Fiber Dressing, 4x5 in (silver alginate) Every Other Day/15 Days ary Discharge Instructions: Apply silver alginate to wound bed as instructed Secondary Dressing: Woven Gauze Sponge, Non-Sterile 4x4 in (Generic) Every Other Day/15 Days Discharge Instructions: Apply over primary dressing as directed. Secondary Dressing: Woven Gauze Sponges 2x2 in (Generic) Every Other Day/15 Days Discharge Instructions: Apply over primary dressing as directed. Secondary Dressing: Optifoam Non-Adhesive Dressing, 4x4 in Every Other Day/15 Days Discharge Instructions: Use foam donut for offloading Secured With: Conforming Stretch Gauze Bandage, Sterile 2x75 (in/in) (Generic) Every Other Day/15 Days Discharge Instructions: Secure with stretch gauze as directed. Secured With: Paper Tape, 1x10 (in/yd) (Generic) Every Other Day/15 Days Discharge Instructions: Secure dressing with tape as directed. Electronic Signature(s) Signed: 07/28/2020 5:22:09 PM By: Linton Ham MD Signed: 07/31/2020 5:09:47 PM By: Rhae Hammock RN Entered By: Rhae Hammock on 07/28/2020 08:56:25 -------------------------------------------------------------------------------- Problem List Details Patient Name: Date of Service: Micheal Quails D. 07/28/2020 9:00 A M Medical Record Number: [id number] Patient Account Number: [id number] Date of Birth/Sex: Treating RN: [birth year] (85 y.o. [name] Primary  Care Provider: [name] Other Clinician: Referring Provider: Treating Provider/Extender: Lynden Ang in Treatment:  16 Active Problems ICD-10 Encounter Code Description Active Date MDM Diagnosis E11.621 Type 2 diabetes mellitus with foot ulcer 04/03/2020 No Yes E11.51 Type 2 diabetes mellitus with diabetic peripheral angiopathy without gangrene 04/03/2020 No Yes L97.528 Non-pressure chronic ulcer of other part of left foot with other specified 07/07/2020 No Yes severity Inactive Problems Resolved Problems Electronic Signature(s) Signed: 07/28/2020 5:22:09 PM By: Linton Ham MD Entered By: Linton Ham on 07/28/2020 08:55:36 -------------------------------------------------------------------------------- Progress Note Details Patient Name: Date of Service: Micheal Quails D. 07/28/2020 9:00 A M Medical Record Number: [id number] Patient Account Number: [id number] Date of Birth/Sex: Treating RN: Apr 30, 1936 (85 y.o. [name], [name] Primary Care Provider: [name] Other Clinician: Referring Provider: Treating Provider/Extender: Lynden Ang in Treatment: 16 Subjective History of Present Illness (HPI) 02/19/2020 upon evaluation today patient presents today for initial evaluation in clinic concerning issues that has been having with his bilateral lower extremities. He apparently also had a wound on his great toe. With that being said he does have a history of diabetes mellitus type 2, lymphedema, peripheral vascular disease, congestive heart failure, hypertension, right below-knee amputation, and chronic kidney disease stage IV according to records reviewed with his creatinine clearance being below 30. With that being said the legs have been actually worse than what they are currently he does not seem to have any open wounds at this point significantly he does have a small leaking area on the left leg at this point. With  that being said the majority of what I see is a lot of skin changes secondary to the chronic lymphedema to be honest. There does not appear to be any signs of systemic infection or local infection based on what I see today. The patient is doing much better with regard to the wound on his toe as well according to the daughter. He has been seeing podiatry. They do have questions about whether or not he can perform physical therapy to have no limitations on physical therapy with regard to his wounds to be honest. With that being said he questions having some pain in the ankle region which again I am not really the specialist to comment on that therefore I recommended that he probably needs to discuss this with his podiatrist. 02/26/2020 upon evaluation today patient appears to actually be doing extremely well he is completely healed and overall is looking excellent. They did get the compression stockings ordered yesterday so they should be on the way hopefully arriving if not tomorrow than Friday. In the meantime we may just utilize a Ace wrap to try to keep the edema under as much control as possible until he gets his compression stockings. READMISSION 04/03/2021 This is an 85 year old man we had for 2 visits in the clinic in September seen by Jeri Cos. Patient has chronic venous insufficiency with secondary lymphedema. He had wounds on the left anterior leg these healed out. He is also known to Dr. Doren Custard last seen in August 2020. He is known to have an occluded PTA with monophasic waveforms has an 8 occluded tibioperoneal trunk. At the time he was not felt to be a candidate for angiography because of his stage IV chronic renal failure and his wounds were closed. If he was to require angiography he would need a CO2 angiogram. His last arterial studies were in August 2020 as well. He had triphasic waveforms down to the mid popliteal biphasic distally and then monophasic at the dorsalis of the  tibioperoneal trunk, ATA occluded  PTA monophasic distal PTA his peroneal was not visualized. He wears compression stockings but did not bring 1 in today He thinks the wound currently we are looking at started about 2 weeks ago. He thinks it is because of abrasion on his foot wear. He has not been wearing his shoe he switched into a left surgical sandal. He has been cleaning this with saline and wrapping it with gauze. ABI in our clinic was 0.52. Please visit previous arterial work-up by Dr. Doren Custard in August 2020 as noted. 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 We use silver collagen. He has known PAD and very significant lymphedema. I have not ordered arterial studies or vascular consult unless this area worsens 11/12; this is a patient with a punched-out wound over the interphalangeal joint of his left great toe dorsally. He has severe PAD which is not listed is being amenable to revascularization or angiography because of his stage IV chronic renal failure. We have been using collagen 12/3; punched-out area over the inner phalangeal joint of the left great toe in the setting of known PAD. He was not felt previously to be a good revascularization candidate because of renal insufficiency. We have been using collagen. The patient states he noticed this draining about 2 to 3 days ago. When he arrived in clinic our intake nurse noted purulent drainage and wound a lot larger especially in depth of 0.7 cm 12/10; patient with a wound on the dorsal part in the inner phalangeal joint of the left great toe. This deteriorated quite a bit the last time he was here. A culture I did showed Staphylococcus lugdunensis. I put him on doxycycline and I will extend this today. X-ray showed no evidence of osteomyelitis. Really not any better today  there is exposed bone at the bottom of this undermining that is hard to measure 12/17; is a patient with a wound on the dorsal part of the inter phalangeal joint of his left great toe. I have him on antibiotics and I gave him a 2-week course of doxycycline this seems to have close down the wound quite a bit and there is no longer palpable bone. 12/28; dorsal part of the interphalangeal joint of the left great toe. There is no palpable bone which is improvement. He completed antibiotics. Has been using silver collagen 1/11; dorsal part of the interphalangeal joint of the left great toe. Again today there was exposed bone. This is not particularly new but had not been obvious over the last 2 weeks. We have been using Hydrofera Blue. X-ray I did last month was negative for osteomyelitis 1/25; dorsal part of the inner phalangeal joint of the left great toe. BONE SCRAPING for PCR I did last time was completely negative, no pathogens identified. We have been using Hydrofera Blue with no improvement 2/1; dorsal part of the interphalangeal joint. He also has a new area on the plantar part of the left great toe. We have not nearly been making much improvement here. He does have known PAD 2/15; since the patient was last here he was admitted to hospital from 07/18/2020 through 07/22/2020. He was initially felt to have a possible CVA right vertebral artery stenosis and acute kidney injury. Neurology recommended aspirin and Plavix. They noted the wound on the left great toe felt that he had  sepsis secondary to a diabetic left foot infection. An MRI was ordered that showed abnormal marrow edema in the distal and proximal phalanx of the great toe centered at the interphalangeal joint the patient has an erosion at the head of the proximal phalanx of the great toe there was no joint effusion no other significant abnormalities. It was felt that this might represent either gout or osteomyelitis. The patient was seen by  Dr. Gale Journey of infectious disease. He did receive ceftriaxone and Flagyl which was converted to Augmentin and doxycycline until today. He also has severe PAD. He has a follow-up with Dr. Gale Journey in early March on March 2 at 2 PM. At that point inflammatory markers assessment to see if he needs additional antibiotic therapy. Patient History Information obtained from Patient. Family History Cancer - Siblings, Diabetes - Mother,Siblings, Hypertension - Siblings, No family history of Heart Disease, Hereditary Spherocytosis, Kidney Disease, Lung Disease, Seizures, Stroke, Thyroid Problems, Tuberculosis. Social History Never smoker, Marital Status - Married, Alcohol Use - Rarely, Drug Use - No History, Caffeine Use - Never. Medical History Eyes Patient has history of Cataracts - removed Denies history of Glaucoma, Optic Neuritis Ear/Nose/Mouth/Throat Denies history of Chronic sinus problems/congestion, Middle ear problems Hematologic/Lymphatic Patient has history of Anemia, Lymphedema Denies history of Hemophilia, Human Immunodeficiency Virus, Sickle Cell Disease Respiratory Patient has history of Sleep Apnea - CPAP Denies history of Aspiration, Asthma, Chronic Obstructive Pulmonary Disease (COPD), Pneumothorax, Tuberculosis Cardiovascular Patient has history of Congestive Heart Failure, Hypertension, Peripheral Arterial Disease, Peripheral Venous Disease Denies history of Angina, Arrhythmia, Hypotension, Myocardial Infarction, Phlebitis, Vasculitis Gastrointestinal Denies history of Cirrhosis , Colitis, Crohnoos, Hepatitis A, Hepatitis B, Hepatitis C Endocrine Patient has history of Type II Diabetes Denies history of Type I Diabetes Genitourinary Patient has history of 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 - right BKA 2000. - Anemia 2012. - Fall 10/2019. - CVA, Sepsis 07/2020. Medical A Surgical History Notes nd Constitutional Symptoms (General Health) Stroke 2002 Ear/Nose/Mouth/Throat coughing attacks at times related to swallowing issues at times. Objective Constitutional Sitting or standing Blood Pressure is within target range for patient.. Pulse regular and within target range for patient.Marland Kitchen Respirations regular, non-labored and within target range.. Temperature is normal and within the target range for the patient.Marland Kitchen Appears in no distress. Vitals Time Taken: 8:25 AM, Height: 73 in, Weight: 275 lbs, BMI: 36.3, Temperature: 97.7 F, Pulse: 96 bpm, Respiratory Rate: 18 breaths/min, Blood Pressure: 112/48 mmHg, Capillary Blood Glucose: 120 mg/dl. General Notes: glucose per pt report General Notes: Wound exam; open wound at the dorsal interphalangeal joint of the left great toe seems closed. On the plantar surface now he has a denuded area that was present last time. I remove this. There is no gross infection this all seems superficial. Integumentary (Hair, Skin) Wound #1 status is Open. Original cause of wound was Blister. The wound is located on the Left T Great. The wound measures 0.4cm length x 0.4cm width x oe 0.1cm depth; 0.126cm^2 area and 0.013cm^3 volume. There is Fat Layer (Subcutaneous Tissue) exposed. There is no tunneling or undermining noted. There is a medium amount of  serosanguineous drainage noted. The wound margin is thickened. There is large (67-100%) pink, pale granulation within the wound bed. There is no  necrotic tissue within the wound bed. Wound #2 status is Open. Original cause of wound was Pressure Injury. The wound is located on the [employer]. The wound measures 1.5cm length oe x 1.5cm width x 0.1cm depth; 1.767cm^2 area and 0.177cm^3 volume. There is Fat Layer (Subcutaneous Tissue) exposed. There is no tunneling or undermining noted. There is a medium amount of serosanguineous drainage noted. The wound margin is flat and intact. There is large (67-100%) red granulation within the wound bed. There is no necrotic tissue within the wound bed. Assessment Active Problems ICD-10 Type 2 diabetes mellitus with foot ulcer Type 2 diabetes mellitus with diabetic peripheral angiopathy without gangrene Non-pressure chronic ulcer of other part of left foot with other specified severity Procedures Wound #2 Pre-procedure diagnosis of Wound #2 is a Diabetic Wound/Ulcer of the Lower Extremity located on the [place] .Severity of Tissue Pre oe Debridement is: Fat layer exposed. There was a Selective/Open Wound Skin/Epidermis Debridement with a total area of 2.25 sq cm performed by Ricard Dillon., MD. With the following instrument(s): Curette to remove Viable and Non-Viable tissue/material. Material removed includes Slough, Skin: Dermis, and Skin: Epidermis after achieving pain control using Lidocaine. No specimens were taken. A time out was conducted at 08:49, prior to the start of the procedure. A Minimum amount of bleeding was controlled with Pressure. The procedure was tolerated well with a pain level of 0 throughout and a pain level of 0 following the procedure. Post Debridement Measurements: 1.5cm length x 1.5cm width x 0.1cm depth; 0.177cm^3 volume. Character of Wound/Ulcer Post Debridement is improved. Severity of Tissue Post Debridement is: Fat layer exposed. Post procedure Diagnosis Wound #2: Same as Pre-Procedure Plan Follow-up Appointments: Return Appointment in 1  week. Bathing/ Shower/ Hygiene: May shower and wash wound with soap and water. Edema Control - Lymphedema / SCD / Other: Elevate legs to the level of the heart or above for 30 minutes daily and/or when sitting, a frequency of: - throughout the day Avoid standing for long periods of time. Patient to wear own compression stockings every day. - both legs Exercise regularly Moisturize legs daily. Off-Loading: Wound #1 Left T Great: oe Other: - use felt for offloading Wound #2 Left,Plantar T Great: oe Other: - use felt for offloading Home Health: Admit to [facility] for wound care. May utilize formulary equivalent dressing for wound treatment orders unless otherwise specified. New wound care orders this week; continue Home Health for wound care. May utilize formulary equivalent dressing for wound treatment orders unless otherwise specified. - [facility] WOUND #1: - T Great Wound Laterality: Left oe Cleanser: Soap and Water Every Other Day/15 Days Discharge Instructions: May shower and wash wound with dial antibacterial soap and water prior to dressing change. Cleanser: Wound Cleanser Every Other Day/15 Days Discharge Instructions: Cleanse the wound with wound cleanser prior to applying a clean dressing using gauze sponges, not tissue or cotton balls. Cleanser: Normal Saline (Generic) Every Other Day/15 Days Discharge Instructions: Cleanse the wound with Normal Saline prior to applying a clean dressing using gauze sponges, not tissue or cotton balls. Prim Dressing: KerraCel Ag Gelling Fiber Dressing, 4x5 in (silver alginate) Every Other Day/15 Days ary Discharge Instructions: Apply silver alginate to wound bed as instructed Secondary Dressing: Woven Gauze Sponge, Non-Sterile 4x4 in (Generic) Every Other Day/15 Days Discharge Instructions: Apply over primary dressing  as directed. Secondary Dressing: Woven Gauze Sponges 2x2 in (Generic) Every Other Day/15 Days Discharge Instructions: Apply  over primary dressing as directed. Secondary Dressing: Optifoam Non-Adhesive Dressing, 4x4 in Every Other Day/15 Days Discharge Instructions: Use foam donut for offloading Secured With: Conforming Stretch Gauze Bandage, Sterile 2x75 (in/in) (Generic) Every Other Day/15 Days Discharge Instructions: Secure with stretch gauze as directed. Secured With: Paper T ape, 1x10 (in/yd) Every Other Day/15 Days Discharge Instructions: Secure dressing with tape as directed. WOUND #2: - T Great Wound Laterality: Plantar, Left oe Cleanser: Soap and Water Every Other Day/15 Days Discharge Instructions: May shower and wash wound with dial antibacterial soap and water prior to dressing change. Cleanser: Wound Cleanser Every Other Day/15 Days Discharge Instructions: Cleanse the wound with wound cleanser prior to applying a clean dressing using gauze sponges, not tissue or cotton balls. Cleanser: Normal Saline Every Other Day/15 Days Discharge Instructions: Cleanse the wound with Normal Saline prior to applying a clean dressing using gauze sponges, not tissue or cotton balls. Prim Dressing: KerraCel Ag Gelling Fiber Dressing, 4x5 in (silver alginate) Every Other Day/15 Days ary Discharge Instructions: Apply silver alginate to wound bed as instructed Secondary Dressing: Woven Gauze Sponge, Non-Sterile 4x4 in (Generic) Every Other Day/15 Days Discharge Instructions: Apply over primary dressing as directed. Secondary Dressing: Woven Gauze Sponges 2x2 in (Generic) Every Other Day/15 Days Discharge Instructions: Apply over primary dressing as directed. Secondary Dressing: Optifoam Non-Adhesive Dressing, 4x4 in Every Other Day/15 Days Discharge Instructions: Use foam donut for offloading Secured With: Conforming Stretch Gauze Bandage, Sterile 2x75 (in/in) (Generic) Every Other Day/15 Days Discharge Instructions: Secure with stretch gauze as directed. Secured With: Paper Tape, 1x10 (in/yd) (Generic) Every Other Day/15  Days Discharge Instructions: Secure dressing with tape as directed. 1. The patient was in hospital felt to possibly have sepsis from his left first toe. 2. MRI suggested possible osteomyelitis may be gout. Interestingly the original wound which was over the interphalangeal joint of the left great toe is closed over. The body area that it I noted last time is open but it is superficial. 3. At this point he is completing the oral antibiotics that were prescribed when he left the hospital and I really agree with the idea of withholding antibiotics at this point. There seems little evidence clinically to support osteomyelitis although it seems reasonable to repeat his inflammatory markers and the condition of his toe in 2 weeks. 4. We are going to try and offload the toe is much as possible. He has home health coming out on Friday Electronic Signature(s) Signed: 07/28/2020 5:22:09 PM By: Linton Ham MD Entered By: Linton Ham on 07/28/2020 09:01:49 -------------------------------------------------------------------------------- HxROS Details Patient Name: Date of Service: Micheal Quails D. 07/28/2020 9:00 A M Medical Record Number: [id number] Patient Account Number: [id number] Date of Birth/Sex: Treating RN: [birth year] (85 y.o. [name] Primary Care Provider: [name] Other Clinician: Referring Provider: Treating Provider/Extender: Lynden Ang in Treatment: 16 Information Obtained From Patient Constitutional Symptoms (General Health) Medical History: Past Medical History Notes: Stroke 2002 Eyes Medical History: Positive for: Cataracts - removed Negative for: Glaucoma; Optic Neuritis Ear/Nose/Mouth/Throat Medical History: Negative for: Chronic sinus problems/congestion; Middle ear problems Past Medical History Notes: coughing attacks at times related to swallowing issues at times. Hematologic/Lymphatic Medical History: Positive for:  Anemia; Lymphedema Negative for: Hemophilia; Human Immunodeficiency Virus; Sickle Cell Disease Respiratory Medical History: Positive for: Sleep Apnea - CPAP Negative for: Aspiration; Asthma; Chronic Obstructive Pulmonary Disease (COPD); Pneumothorax; Tuberculosis  Cardiovascular Medical History: Positive for: Congestive Heart Failure; Hypertension; Peripheral Arterial Disease; Peripheral Venous Disease Negative for: Angina; Arrhythmia; Hypotension; Myocardial Infarction; Phlebitis; Vasculitis Gastrointestinal Medical History: Negative for: Cirrhosis ; Colitis; Crohns; Hepatitis A; Hepatitis B; Hepatitis C Endocrine Medical History: Positive for: Type II Diabetes Negative for: Type I Diabetes Time with diabetes: 1980s Treated with: Insulin Blood sugar tested every day: Yes Tested : Genitourinary Medical History: Positive for: End Stage Renal Disease - stage IIII Immunological Medical History: Negative for: Lupus Erythematosus; Raynauds; Scleroderma Integumentary (Skin) Medical History: Negative for: History of Burn Musculoskeletal Medical History: Positive for: Gout Negative for: Rheumatoid Arthritis; Osteoarthritis; Osteomyelitis Neurologic Medical History: Positive for: Neuropathy Negative for: Dementia; Quadriplegia; Paraplegia; Seizure Disorder Oncologic Medical History: Negative for: Received Chemotherapy; Received Radiation Psychiatric Medical History: Negative for: Anorexia/bulimia; Confinement Anxiety HBO Extended History Items Eyes: Cataracts Immunizations Pneumococcal Vaccine: Received Pneumococcal Vaccination: Yes Implantable Devices None Hospitalization / Surgery History Type of Hospitalization/Surgery Stroke 2002 right BKA 2000 Anemia 2012 Fall 10/2019 CVA, Sepsis 07/2020 Family and Social History Cancer: Yes - Siblings; Diabetes: Yes - Mother,Siblings; Heart Disease: No; Hereditary Spherocytosis: No; Hypertension: Yes - Siblings; Kidney  Disease: No; Lung Disease: No; Seizures: No; Stroke: No; Thyroid Problems: No; Tuberculosis: No; Never smoker; Marital Status - Married; Alcohol Use: Rarely; Drug Use: No History; Caffeine Use: Never; Financial Concerns: No; Food, Clothing or Shelter Needs: No; Support System Lacking: No; Transportation Concerns: No Electronic Signature(s) Signed: 07/28/2020 5:22:09 PM By: Linton Ham MD Signed: 07/28/2020 5:45:50 PM By: Levan Hurst RN, BSN Entered By: Levan Hurst on 07/28/2020 08:30:33 -------------------------------------------------------------------------------- SuperBill Details Patient Name: Date of Service: Micheal Quails D. 07/28/2020 Medical Record Number: [id number] Patient Account Number: [id number] Date of Birth/Sex: Treating RN: 29-Jan-1936 (85 y.o. [name] Primary Care Provider: [name] Other Clinician: Referring Provider: Treating Provider/Extender: Lynden Ang in Treatment: 16 Diagnosis Coding ICD-10 Codes Code Description E11.621 Type 2 diabetes mellitus with foot ulcer E11.51 Type 2 diabetes mellitus with diabetic peripheral angiopathy without gangrene L97.528 Non-pressure chronic ulcer of other part of left foot with other specified severity Facility Procedures CPT4 Code: [id number] Description: [phone] - [facility] 1ST 20 SQ CM OR < ICD-10 Diagnosis Description L97.528 Non-pressure chronic ulcer of other part of left foot with other specified severi Modifier: ty Quantity: 1 Physician Procedures : CPT4 Code Description Modifier [id number] - WC PHYS DEBR WO ANESTH 20 SQ CM ICD-10 Diagnosis Description L97.528 Non-pressure chronic ulcer of other part of left foot with other specified severity Quantity: 1 Electronic Signature(s) Signed: 07/28/2020 5:22:09 PM By: Linton Ham MD Entered By: Linton Ham on 07/28/2020 09:02:01

## 2020-07-31 NOTE — Progress Notes (Signed)
[name], [name] ([id number]) Visit Report for 07/28/2020 Arrival Information Details Patient Name: Date of Service: Micheal Walls, Micheal Walls 07/28/2020 9:00 A M Medical Record Number: [id number] Patient Account Number: [id number] Date of Birth/Sex: Treating RN: 27-May-1936 (85 y.o. [name] Primary Care Delva Derden: [name] Other Clinician: Referring Georga Stys: Treating Zeola Brys/Extender: Lynden Ang in Treatment: 36 Visit Information History Since Last Visit Added or deleted any medications: Yes Patient Arrived: Wheel Chair Any new allergies or adverse reactions: No Arrival Time: 08:17 Had a fall or experienced change in No Accompanied By: alone activities of daily living that may affect Transfer Assistance: None risk of falls: Patient Identification Verified: Yes Signs or symptoms of abuse/neglect since last visito No Secondary Verification Process Completed: Yes Hospitalized since last visit: Yes Patient Requires Transmission-Based Precautions: No Implantable device outside of the clinic excluding No Patient Has Alerts: No cellular tissue based products placed in the center since last visit: Has Dressing in Place as Prescribed: Yes Pain Present Now: No Electronic Signature(s) Signed: 07/28/2020 5:45:50 PM By: Levan Hurst RN, BSN Entered By: Levan Hurst on 07/28/2020 08:36:01 -------------------------------------------------------------------------------- Encounter Discharge Information Details Patient Name: Date of Service: Micheal Quails D. 07/28/2020 9:00 A M Medical Record Number: [id number] Patient Account Number: [id number] Date of Birth/Sex: Treating RN: 08-22-1935 (85 y.o. [name] Primary Care Kadija Cruzen: [name] Other Clinician: Referring Trayveon Beckford: Treating Emmit Oriley/Extender: Lynden Ang in Treatment: 16 Encounter Discharge Information Items Post Procedure Vitals Discharge Condition:  Stable Temperature (F): 97.7 Ambulatory Status: Wheelchair Pulse (bpm): 96 Discharge Destination: Home Respiratory Rate (breaths/min): 18 Transportation: Private Auto Blood Pressure (mmHg): 112/48 Accompanied By: alone Schedule Follow-up Appointment: Yes Clinical Summary of Care: Patient Declined Electronic Signature(s) Signed: 07/28/2020 5:45:50 PM By: Levan Hurst RN, BSN Entered By: Levan Hurst on 07/28/2020 17:14:24 -------------------------------------------------------------------------------- Lower Extremity Assessment Details Patient Name: Date of Service: Micheal Quails D. 07/28/2020 9:00 A M Medical Record Number: [id number] Patient Account Number: [id number] Date of Birth/Sex: Treating RN: 10-01-1935 (85 y.o. [name] Primary Care Emry Tobin: [name] Other Clinician: Referring Aunna Snooks: Treating Najai Waszak/Extender: Lynden Ang in Treatment: 16 Edema Assessment Assessed: Shirlyn Goltz: No] Patrice Paradise: No] Edema: [Left: Ye] [Right: s] Calf Left: Right: Point of Measurement: 40 cm From Medial Instep 38 cm Ankle Left: Right: Point of Measurement: 13 cm From Medial Instep 34 cm Vascular Assessment Pulses: Dorsalis Pedis Palpable: [Left:Yes] Electronic Signature(s) Signed: 07/28/2020 5:45:50 PM By: Levan Hurst RN, BSN Entered By: Levan Hurst on 07/28/2020 08:34:49 -------------------------------------------------------------------------------- Multi Wound Chart Details Patient Name: Date of Service: Micheal Quails D. 07/28/2020 9:00 A M Medical Record Number: [id number] Patient Account Number: [id number] Date of Birth/Sex: Treating RN: [birth year] (85 y.o. Micheal Walls, [name] Primary Care Rini Moffit: [name] Other Clinician: Referring Christina Gintz: Treating Kalayla Shadden/Extender: Lynden Ang in Treatment: 16 Vital Signs Height(in): 63 Capillary Blood Glucose(mg/dl): 120 Weight(lbs):  275 Pulse(bpm): 24 Body Mass Index(BMI): 62 Blood Pressure(mmHg): 112/48 Temperature(F): 97.7 Respiratory Rate(breaths/min): 18 Photos: [1:No Photos Left T Great oe] [2:No Photos Left, Plantar T Great oe] [N/A:N/A N/A] Wound Location: [1:Blister] [2:Pressure Injury] [N/A:N/A] Wounding Event: [1:Diabetic Wound/Ulcer of the Lower] [2:Diabetic Wound/Ulcer of the Lower] [N/A:N/A] Primary Etiology: [1:Extremity Cataracts, Anemia, Lymphedema,] [2:Extremity Cataracts, Anemia, Lymphedema,] [N/A:N/A] Comorbid History: [1:Sleep Apnea, Congestive Heart Failure, Hypertension, Peripheral Arterial Disease, Peripheral Venous Disease, Type II Diabetes, End Stage Disease, Type II Diabetes, End Stage Renal Disease, Gout, Neuropathy 03/27/2020] [2:Sleep Apnea,  Congestive Heart Failure, Hypertension, Peripheral Arterial Disease, Peripheral  Venous Renal Disease, Gout, Neuropathy 07/14/2020] [N/A:N/A] Date Acquired: [1:16] [2:2] [N/A:N/A] Weeks of Treatment: [1:Open] [2:Open] [N/A:N/A] Wound Status: [1:0.4x0.4x0.1] [2:1.5x1.5x0.1] [N/A:N/A] Measurements L x W x D (cm) [1:0.126] [2:1.767] [N/A:N/A] A (cm) : rea [1:0.013] [2:0.177] [N/A:N/A] Volume (cm) : [1:-306.50%] [2:-1153.20%] [N/A:N/A] % Reduction in A [1:rea: 31.60%] [2:-1164.30%] [N/A:N/A] % Reduction in Volume: [1:Grade 2] [2:Grade 2] [N/A:N/A] Classification: [1:Medium] [2:Medium] [N/A:N/A] Exudate A mount: [1:Serosanguineous] [2:Serosanguineous] [N/A:N/A] Exudate Type: [1:red, brown] [2:red, brown] [N/A:N/A] Exudate Color: [1:Thickened] [2:Flat and Intact] [N/A:N/A] Wound Margin: [1:Large (67-100%)] [2:Large (67-100%)] [N/A:N/A] Granulation A mount: [1:Pink, Pale] [2:Red] [N/A:N/A] Granulation Quality: [1:None Present (0%)] [2:None Present (0%)] [N/A:N/A] Necrotic A mount: [1:Fat Layer (Subcutaneous Tissue): Yes Fat Layer (Subcutaneous Tissue): Yes N/A] Exposed Structures: [1:Fascia: No Tendon: No Muscle: No Joint: No Bone: No Medium (34-66%)]  [2:Fascia: No Tendon: No Muscle: No Joint: No Bone: No None] [N/A:N/A] Epithelialization: [1:N/A] [2:Debridement - Excisional] [N/A:N/A] Debridement: Pre-procedure Verification/Time Out N/A [2:08:49] [N/A:N/A] Taken: [1:N/A] [2:Lidocaine] [N/A:N/A] Pain Control: [1:N/A] [2:Subcutaneous, Slough] [N/A:N/A] Tissue Debrided: [1:N/A] [2:Skin/Subcutaneous Tissue] [N/A:N/A] Level: [1:N/A] [2:2.25] [N/A:N/A] Debridement A (sq cm): [1:rea N/A] [2:Curette] [N/A:N/A] Instrument: [1:N/A] [2:Minimum] [N/A:N/A] Bleeding: [1:N/A] [2:Pressure] [N/A:N/A] Hemostasis A chieved: [1:N/A] [2:0] [N/A:N/A] Procedural Pain: [1:N/A] [2:0] [N/A:N/A] Post Procedural Pain: [1:N/A] [2:Procedure was tolerated well] [N/A:N/A] Debridement Treatment Response: [1:N/A] [2:1.5x1.5x0.1] [N/A:N/A] Post Debridement Measurements L x W x D (cm) [1:N/A] [2:0.177] [N/A:N/A] Post Debridement Volume: (cm) [1:N/A] [2:Debridement] [N/A:N/A] Treatment Notes Electronic Signature(s) Signed: 07/28/2020 5:22:09 PM By: Linton Ham MD Signed: 07/31/2020 5:09:47 PM By: Rhae Hammock RN Entered By: Linton Ham on 07/28/2020 08:55:46 -------------------------------------------------------------------------------- Multi-Disciplinary Care Plan Details Patient Name: Date of Service: Micheal Quails D. 07/28/2020 9:00 A M Medical Record Number: [id number] Patient Account Number: [id number] Date of Birth/Sex: Treating RN: 03-13-1936 (85 y.o. Micheal Walls, [name] Primary Care Tran Arzuaga: [name] Other Clinician: Referring Herman Mell: Treating Dietra Stokely/Extender: Lynden Ang in Treatment: 16 Active Inactive Wound/Skin Impairment Nursing Diagnoses: Impaired tissue integrity Knowledge deficit related to ulceration/compromised skin integrity Goals: Patient/caregiver will verbalize understanding of skin care regimen Date Initiated: 04/03/2020 Target Resolution Date: 08/14/2020 Goal Status:  Active Ulcer/skin breakdown will have a volume reduction of 50% by week 8 Date Initiated: 04/03/2020 Date Inactivated: 05/15/2020 Target Resolution Date: 05/01/2020 Goal Status: Unmet Unmet Reason: offloading issue Ulcer/skin breakdown will have a volume reduction of 80% by week 12 Date Initiated: 05/15/2020 Target Resolution Date: 08/14/2020 Goal Status: Active Interventions: Assess patient/caregiver ability to obtain necessary supplies Assess patient/caregiver ability to perform ulcer/skin care regimen upon admission and as needed Assess ulceration(s) every visit Provide education on ulcer and skin care Treatment Activities: Skin care regimen initiated : 04/03/2020 Topical wound management initiated : 04/03/2020 Notes: Electronic Signature(s) Signed: 07/31/2020 5:09:47 PM By: Rhae Hammock RN Entered By: Rhae Hammock on 07/28/2020 08:56:41 -------------------------------------------------------------------------------- Pain Assessment Details Patient Name: Date of Service: Micheal Quails D. 07/28/2020 9:00 A M Medical Record Number: [id number] Patient Account Number: [id number] Date of Birth/Sex: Treating RN: 03/08/1936 (85 y.o. [name] Primary Care Amandy Chubbuck: [name] Other Clinician: Referring Machai Desmith: Treating Jhanae Jaskowiak/Extender: Lynden Ang in Treatment: 16 Active Problems Location of Pain Severity and Description of Pain Patient Has Paino No Site Locations Pain Management and Medication Current Pain Management: Electronic Signature(s) Signed: 07/28/2020 5:45:50 PM By: Levan Hurst RN, BSN Entered By: Levan Hurst on 07/28/2020 08:26:27 -------------------------------------------------------------------------------- Patient/Caregiver Education Details Patient Name: Date of Service: Micheal Walls, Micheal ERETT D. 2/15/2022andnbsp9:00 A M Medical Record Number: [id number] Patient Account Number: [id number] Date  of  Birth/Gender: Treating RN: 04-18-1936 (85 y.o. [name] Primary Care Physician: [name] Other Clinician: Referring Physician: Treating Physician/Extender: Lynden Ang in Treatment: 16 Education Assessment Education Provided To: Patient Education Topics Provided Wound/Skin Impairment: Electronic Signature(s) Signed: 07/31/2020 5:09:47 PM By: Rhae Hammock RN Entered By: Rhae Hammock on 07/28/2020 08:57:05 -------------------------------------------------------------------------------- Wound Assessment Details Patient Name: Date of Service: Micheal Quails D. 07/28/2020 9:00 A M Medical Record Number: [id number] Patient Account Number: [id number] Date of Birth/Sex: Treating RN: 01-02-1936 (85 y.o. [name] Primary Care Waldine Zenz: [name] Other Clinician: Referring Edwyn Inclan: Treating Kamira Mellette/Extender: Lynden Ang in Treatment: 16 Wound Status Wound Number: 1 Primary Diabetic Wound/Ulcer of the Lower Extremity Etiology: Wound Location: Left T Great oe Wound Open Wounding Event: Blister Status: Date Acquired: 03/27/2020 Comorbid Cataracts, Anemia, Lymphedema, Sleep Apnea, Congestive Heart Weeks Of Treatment: 16 History: Failure, Hypertension, Peripheral Arterial Disease, Peripheral Venous Clustered Wound: No Disease, Type II Diabetes, End Stage Renal Disease, Gout, Neuropathy Photos Photo Uploaded By: Mikeal Hawthorne on 07/29/2020 14:30:22 Wound Measurements Length: (cm) 0.4 Width: (cm) 0.4 Depth: (cm) 0.1 Area: (cm) 0.126 Volume: (cm) 0.013 % Reduction in Area: -306.5% % Reduction in Volume: 31.6% Epithelialization: Medium (34-66%) Tunneling: No Undermining: No Wound Description Classification: Grade 2 Wound Margin: Thickened Exudate Amount: Medium Exudate Type: Serosanguineous Exudate Color: red, brown Foul Odor After Cleansing: No Slough/Fibrino No Wound  Bed Granulation Amount: Large (67-100%) Exposed Structure Granulation Quality: Pink, Pale Fascia Exposed: No Necrotic Amount: None Present (0%) Fat Layer (Subcutaneous Tissue) Exposed: Yes Tendon Exposed: No Muscle Exposed: No Joint Exposed: No Bone Exposed: No Treatment Notes Wound #1 (Toe Great) Wound Laterality: Left Cleanser Soap and Water Discharge Instruction: May shower and wash wound with dial antibacterial soap and water prior to dressing change. Wound Cleanser Discharge Instruction: Cleanse the wound with wound cleanser prior to applying a clean dressing using gauze sponges, not tissue or cotton balls. Normal Saline Discharge Instruction: Cleanse the wound with Normal Saline prior to applying a clean dressing using gauze sponges, not tissue or cotton balls. Peri-Wound Care Topical Primary Dressing KerraCel Ag Gelling Fiber Dressing, 4x5 in (silver alginate) Discharge Instruction: Apply silver alginate to wound bed as instructed Secondary Dressing Woven Gauze Sponge, Non-Sterile 4x4 in Discharge Instruction: Apply over primary dressing as directed. Woven Gauze Sponges 2x2 in Discharge Instruction: Apply over primary dressing as directed. Optifoam Non-Adhesive Dressing, 4x4 in Discharge Instruction: Use foam donut for offloading Secured With Conforming Stretch Gauze Bandage, Sterile 2x75 (in/in) Discharge Instruction: Secure with stretch gauze as directed. Paper Tape, 1x10 (in/yd) Discharge Instruction: Secure dressing with tape as directed. Compression Wrap Compression Stockings Add-Ons Electronic Signature(s) Signed: 07/28/2020 5:45:50 PM By: Levan Hurst RN, BSN Entered By: Levan Hurst on 07/28/2020 08:35:36 -------------------------------------------------------------------------------- Wound Assessment Details Patient Name: Date of Service: Micheal Quails D. 07/28/2020 9:00 A M Medical Record Number: [id number] Patient Account Number: [id number] Date  of Birth/Sex: Treating RN: 09-Jun-1936 (85 y.o. [name] Primary Care Nyzir Dubois: [name] Other Clinician: Referring Binh Doten: Treating Mandell Pangborn/Extender: Lynden Ang in Treatment: 16 Wound Status Wound Number: 2 Primary Diabetic Wound/Ulcer of the Lower Extremity Etiology: Wound Location: Left, Plantar T Great oe Wound Open Wounding Event: Pressure Injury Status: Date Acquired: 07/14/2020 Comorbid Cataracts, Anemia, Lymphedema, Sleep Apnea, Congestive Heart Weeks Of Treatment: 2 History: Failure, Hypertension, Peripheral Arterial Disease, Peripheral Venous Clustered Wound: No Disease, Type II Diabetes, End Stage Renal Disease, Gout, Neuropathy Photos Photo Uploaded By: Ronnald Ramp,  [place] on 07/29/2020 14:30:06 Wound Measurements Length: (cm) 1.5 Width: (cm) 1.5 Depth: (cm) 0.1 Area: (cm) 1.767 Volume: (cm) 0.177 % Reduction in Area: -1153.2% % Reduction in Volume: -1164.3% Epithelialization: None Tunneling: No Undermining: No Wound Description Classification: Grade 2 Wound Margin: Flat and Intact Exudate Amount: Medium Exudate Type: Serosanguineous Exudate Color: red, brown Foul Odor After Cleansing: No Slough/Fibrino No Wound Bed Granulation Amount: Large (67-100%) Exposed Structure Granulation Quality: Red Fascia Exposed: No Necrotic Amount: None Present (0%) Fat Layer (Subcutaneous Tissue) Exposed: Yes Tendon Exposed: No Muscle Exposed: No Joint Exposed: No Bone Exposed: No Treatment Notes Wound #2 (Toe Great) Wound Laterality: Plantar, Left Cleanser Soap and Water Discharge Instruction: May shower and wash wound with dial antibacterial soap and water prior to dressing change. Wound Cleanser Discharge Instruction: Cleanse the wound with wound cleanser prior to applying a clean dressing using gauze sponges, not tissue or cotton balls. Normal Saline Discharge Instruction: Cleanse the wound with Normal Saline prior to  applying a clean dressing using gauze sponges, not tissue or cotton balls. Peri-Wound Care Topical Primary Dressing KerraCel Ag Gelling Fiber Dressing, 4x5 in (silver alginate) Discharge Instruction: Apply silver alginate to wound bed as instructed Secondary Dressing Woven Gauze Sponge, Non-Sterile 4x4 in Discharge Instruction: Apply over primary dressing as directed. Woven Gauze Sponges 2x2 in Discharge Instruction: Apply over primary dressing as directed. Optifoam Non-Adhesive Dressing, 4x4 in Discharge Instruction: Use foam donut for offloading Secured With Conforming Stretch Gauze Bandage, Sterile 2x75 (in/in) Discharge Instruction: Secure with stretch gauze as directed. Paper Tape, 1x10 (in/yd) Discharge Instruction: Secure dressing with tape as directed. Compression Wrap Compression Stockings Add-Ons Electronic Signature(s) Signed: 07/28/2020 5:45:50 PM By: Levan Hurst RN, BSN Entered By: Levan Hurst on 07/28/2020 08:35:47 -------------------------------------------------------------------------------- Vitals Details Patient Name: Date of Service: Micheal Quails D. 07/28/2020 9:00 A M Medical Record Number: [id number] Patient Account Number: [id number] Date of Birth/Sex: Treating RN: April 10, 1936 (85 y.o. [name] Primary Care Zuleika Gallus: [name] Other Clinician: Referring Correna Meacham: Treating Viraat Vanpatten/Extender: Lynden Ang in Treatment: 16 Vital Signs Time Taken: 08:25 Temperature (F): 97.7 Height (in): 73 Pulse (bpm): 96 Weight (lbs): 275 Respiratory Rate (breaths/min): 18 Body Mass Index (BMI): 36.3 Blood Pressure (mmHg): 112/48 Capillary Blood Glucose (mg/dl): 120 Reference Range: 80 - 120 mg / dl Notes glucose per pt report Electronic Signature(s) Signed: 07/28/2020 5:45:50 PM By: Levan Hurst RN, BSN Entered By: Levan Hurst on 07/28/2020 61:53:79

## 2020-08-03 ENCOUNTER — Telehealth: Payer: Self-pay

## 2020-08-03 ENCOUNTER — Telehealth: Payer: Medicare Other

## 2020-08-03 NOTE — Telephone Encounter (Signed)
  Chronic Care Management   Outreach Note  08/03/2020 Name: Micheal Walls MRN: [id number] DOB: 01-19-1936  Referred by: [name], [name] Reason for referral : Care Coordination   An unsuccessful telephone outreach was attempted today. The patient was referred to the case management team for assistance with care management and care coordination.     Follow Up Plan: A HIPAA compliant phone message was left for the patient providing contact information and requesting a return call.  The care management team will reach out to the patient again over the next 10 days.   [name], [name], [name], Certified Dementia Practitioner [place] [facility] [place] [facility] [phone]

## 2020-08-04 ENCOUNTER — Other Ambulatory Visit: Payer: Self-pay | Admitting: *Deleted

## 2020-08-04 ENCOUNTER — Encounter (HOSPITAL_BASED_OUTPATIENT_CLINIC_OR_DEPARTMENT_OTHER): Payer: Medicare Other | Admitting: Internal Medicine

## 2020-08-04 NOTE — Patient Outreach (Signed)
[facility]) Care Management  08/04/2020  [name] 1936-02-20 [id number]   Patient is being transitioned to Chronic Care Management with the primary provider office, case closed.  Valente David, [place], MSN [place] [phone]

## 2020-08-05 DIAGNOSIS — I69398 Other sequelae of cerebral infarction: Secondary | ICD-10-CM | POA: Diagnosis not present

## 2020-08-05 DIAGNOSIS — E1122 Type 2 diabetes mellitus with diabetic chronic kidney disease: Secondary | ICD-10-CM | POA: Diagnosis not present

## 2020-08-05 DIAGNOSIS — R531 Weakness: Secondary | ICD-10-CM | POA: Diagnosis not present

## 2020-08-05 DIAGNOSIS — I5030 Unspecified diastolic (congestive) heart failure: Secondary | ICD-10-CM | POA: Diagnosis not present

## 2020-08-05 DIAGNOSIS — A419 Sepsis, unspecified organism: Secondary | ICD-10-CM | POA: Diagnosis not present

## 2020-08-05 DIAGNOSIS — I130 Hypertensive heart and chronic kidney disease with heart failure and stage 1 through stage 4 chronic kidney disease, or unspecified chronic kidney disease: Secondary | ICD-10-CM | POA: Diagnosis not present

## 2020-08-07 ENCOUNTER — Encounter: Payer: Self-pay | Admitting: Podiatry

## 2020-08-07 ENCOUNTER — Ambulatory Visit (INDEPENDENT_AMBULATORY_CARE_PROVIDER_SITE_OTHER): Payer: Medicare Other | Admitting: Podiatry

## 2020-08-07 ENCOUNTER — Other Ambulatory Visit: Payer: Self-pay

## 2020-08-07 DIAGNOSIS — Z89511 Acquired absence of right leg below knee: Secondary | ICD-10-CM

## 2020-08-07 DIAGNOSIS — L988 Other specified disorders of the skin and subcutaneous tissue: Secondary | ICD-10-CM | POA: Diagnosis not present

## 2020-08-07 DIAGNOSIS — E1151 Type 2 diabetes mellitus with diabetic peripheral angiopathy without gangrene: Secondary | ICD-10-CM

## 2020-08-07 DIAGNOSIS — B351 Tinea unguium: Secondary | ICD-10-CM

## 2020-08-08 MED ORDER — CASTELLANI PAINT MODIFIED 1.5 % EX LIQD: CUTANEOUS | 3 refills | Status: DC

## 2020-08-08 NOTE — Progress Notes (Signed)
  Subjective:  Patient ID: Micheal Walls, male    DOB: Oct 30, 1935,  MRN: [id number]  85 y.o. male presents with at risk foot care. 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    He states he is going back to the [facility] for management of his left hallux ulcer.  PCP: [name], [name] and last visit was: 05/20/2020.  He is requesting refill of modified Castellani's Paint for interdigital maceration. He uses 2-3 times/week for prevention of maceration.  Review of Systems: Negative except as noted in the HPI.   Allergies  Allergen Reactions  . Vancomycin Rash    Objective:  There were no vitals filed for this visit. Constitutional Patient is a pleasant 85 y.o. African American male morbidly obese in NAD. AAO x 3.  Vascular Capillary refill time to digits <4 seconds left lower extremity. Nonpalpable DP pulse(s) left lower extremity. Nonpalpable PT pulse(s) left lower extremity. Pedal hair absent. Lower extremity skin temperature gradient within normal limits. No pain with calf compression b/l. +2 pitting edema left lower extremity. Evidence of chronic venous insufficiency left lower extremity. No cyanosis or clubbing noted.  Neurologic Normal speech. Protective sensation diminished with 10 gram monofilament left lower extremity. Vibratory sensation diminished left lower extremity.  Dermatologic Pedal skin is thin shiny, atrophic left lower extremity. Dressing noted on left hallux which is clean, dry and intact. . No interdigital macerations LLE. Toenails 2-5  left lower extremity elongated, discolored, dystrophic, thickened, and crumbly with subungual debris.  Orthopedic: Muscle strength 5/5 to all LE muscle groups of left lower extremity. Lower extremity amputation(s): below knee amputation right lower extremity. Utilizes motorized chair for mobility assistance.   Hemoglobin A1C Latest Ref Rng & Units 07/19/2020 05/20/2020 01/23/2020 01/17/2020 09/24/2019   HGBA1C 4.8 - 5.6 % 6.1(H) 6.2(H) 6.8(H) 6.8(H) 6.9(H)  Some recent data might be hidden   Assessment:   1. Onychomycosis   2. Skin maceration   3. Status post below-knee amputation of right lower extremity ([place])   4. Type II diabetes mellitus with peripheral circulatory disorder [facility])    Plan:  Patient was evaluated and treated and all questions answered.  Onychomycosis with pain -Nails palliatively debridement as below. -Educated on self-care  Procedure: Nail Debridement Rationale: Pain Type of Debridement: manual, sharp debridement. Instrumentation: Nail nipper, rotary burr. Number of Nails: 4  -Examined patient. -Continue diabetic foot care principles. -Patient to continue soft, supportive shoe gear daily. -Toenails L 2nd toe, L 3rd toe, L 4th toe and L 5th toe debrided in length and girth without iatrogenic bleeding with sterile nail nipper and dremel.  -Patient to report any pedal injuries to medical professional immediately. -Refill of modified Castellani's Paint sent to [facility] [facility] for management of interdigital maceration. He is to use as needed when maceration develops. -Patient/POA to call should there be question/concern in the interim.  Return in about 3 months (around 11/04/2020).  [name], DPM

## 2020-08-09 ENCOUNTER — Encounter (HOSPITAL_COMMUNITY): Payer: Medicare Other

## 2020-08-09 ENCOUNTER — Encounter (HOSPITAL_COMMUNITY): Payer: Self-pay | Admitting: *Deleted

## 2020-08-09 ENCOUNTER — Other Ambulatory Visit: Payer: Self-pay

## 2020-08-09 ENCOUNTER — Inpatient Hospital Stay (HOSPITAL_COMMUNITY): Payer: Medicare Other

## 2020-08-09 ENCOUNTER — Emergency Department (HOSPITAL_COMMUNITY): Payer: Medicare Other

## 2020-08-09 ENCOUNTER — Inpatient Hospital Stay (HOSPITAL_COMMUNITY)
Admission: EM | Admit: 2020-08-09 | Discharge: 2020-08-13 | DRG: 853 | Disposition: A | Discharge status: Home Health | Payer: Medicare Other | Attending: Internal Medicine | Admitting: Internal Medicine

## 2020-08-09 DIAGNOSIS — L03116 Cellulitis of left lower limb: Secondary | ICD-10-CM | POA: Diagnosis not present

## 2020-08-09 DIAGNOSIS — M25472 Effusion, left ankle: Secondary | ICD-10-CM | POA: Diagnosis not present

## 2020-08-09 DIAGNOSIS — L0390 Cellulitis, unspecified: Secondary | ICD-10-CM | POA: Diagnosis not present

## 2020-08-09 DIAGNOSIS — A419 Sepsis, unspecified organism: Secondary | ICD-10-CM | POA: Diagnosis not present

## 2020-08-09 DIAGNOSIS — G4733 Obstructive sleep apnea (adult) (pediatric): Secondary | ICD-10-CM | POA: Diagnosis present

## 2020-08-09 DIAGNOSIS — M7989 Other specified soft tissue disorders: Secondary | ICD-10-CM | POA: Diagnosis not present

## 2020-08-09 DIAGNOSIS — M25476 Effusion, unspecified foot: Secondary | ICD-10-CM

## 2020-08-09 DIAGNOSIS — D509 Iron deficiency anemia, unspecified: Secondary | ICD-10-CM | POA: Diagnosis present

## 2020-08-09 DIAGNOSIS — Z7982 Long term (current) use of aspirin: Secondary | ICD-10-CM

## 2020-08-09 DIAGNOSIS — W06XXXA Fall from bed, initial encounter: Secondary | ICD-10-CM | POA: Diagnosis present

## 2020-08-09 DIAGNOSIS — Z833 Family history of diabetes mellitus: Secondary | ICD-10-CM

## 2020-08-09 DIAGNOSIS — E1151 Type 2 diabetes mellitus with diabetic peripheral angiopathy without gangrene: Secondary | ICD-10-CM | POA: Diagnosis present

## 2020-08-09 DIAGNOSIS — Z7902 Long term (current) use of antithrombotics/antiplatelets: Secondary | ICD-10-CM

## 2020-08-09 DIAGNOSIS — E860 Dehydration: Secondary | ICD-10-CM | POA: Diagnosis present

## 2020-08-09 DIAGNOSIS — I130 Hypertensive heart and chronic kidney disease with heart failure and stage 1 through stage 4 chronic kidney disease, or unspecified chronic kidney disease: Secondary | ICD-10-CM | POA: Diagnosis present

## 2020-08-09 DIAGNOSIS — Z8673 Personal history of transient ischemic attack (TIA), and cerebral infarction without residual deficits: Secondary | ICD-10-CM | POA: Diagnosis not present

## 2020-08-09 DIAGNOSIS — Z8249 Family history of ischemic heart disease and other diseases of the circulatory system: Secondary | ICD-10-CM

## 2020-08-09 DIAGNOSIS — M791 Myalgia, unspecified site: Secondary | ICD-10-CM | POA: Diagnosis not present

## 2020-08-09 DIAGNOSIS — Z89511 Acquired absence of right leg below knee: Secondary | ICD-10-CM

## 2020-08-09 DIAGNOSIS — M86172 Other acute osteomyelitis, left ankle and foot: Secondary | ICD-10-CM | POA: Diagnosis not present

## 2020-08-09 DIAGNOSIS — M869 Osteomyelitis, unspecified: Secondary | ICD-10-CM | POA: Diagnosis present

## 2020-08-09 DIAGNOSIS — Z20822 Contact with and (suspected) exposure to covid-19: Secondary | ICD-10-CM | POA: Diagnosis present

## 2020-08-09 DIAGNOSIS — E1122 Type 2 diabetes mellitus with diabetic chronic kidney disease: Secondary | ICD-10-CM | POA: Diagnosis present

## 2020-08-09 DIAGNOSIS — Z792 Long term (current) use of antibiotics: Secondary | ICD-10-CM | POA: Diagnosis not present

## 2020-08-09 DIAGNOSIS — N1832 Chronic kidney disease, stage 3b: Secondary | ICD-10-CM | POA: Diagnosis present

## 2020-08-09 DIAGNOSIS — Y92009 Unspecified place in unspecified non-institutional (private) residence as the place of occurrence of the external cause: Secondary | ICD-10-CM | POA: Diagnosis not present

## 2020-08-09 DIAGNOSIS — I5032 Chronic diastolic (congestive) heart failure: Secondary | ICD-10-CM | POA: Diagnosis present

## 2020-08-09 DIAGNOSIS — L97529 Non-pressure chronic ulcer of other part of left foot with unspecified severity: Secondary | ICD-10-CM | POA: Diagnosis present

## 2020-08-09 DIAGNOSIS — E861 Hypovolemia: Secondary | ICD-10-CM | POA: Diagnosis present

## 2020-08-09 DIAGNOSIS — I739 Peripheral vascular disease, unspecified: Secondary | ICD-10-CM | POA: Diagnosis not present

## 2020-08-09 DIAGNOSIS — Z89519 Acquired absence of unspecified leg below knee: Secondary | ICD-10-CM

## 2020-08-09 DIAGNOSIS — R531 Weakness: Secondary | ICD-10-CM | POA: Diagnosis not present

## 2020-08-09 DIAGNOSIS — E1169 Type 2 diabetes mellitus with other specified complication: Secondary | ICD-10-CM | POA: Diagnosis present

## 2020-08-09 DIAGNOSIS — R609 Edema, unspecified: Secondary | ICD-10-CM

## 2020-08-09 DIAGNOSIS — E1140 Type 2 diabetes mellitus with diabetic neuropathy, unspecified: Secondary | ICD-10-CM | POA: Diagnosis present

## 2020-08-09 DIAGNOSIS — R231 Pallor: Secondary | ICD-10-CM | POA: Diagnosis not present

## 2020-08-09 DIAGNOSIS — K219 Gastro-esophageal reflux disease without esophagitis: Secondary | ICD-10-CM | POA: Diagnosis present

## 2020-08-09 DIAGNOSIS — M6258 Muscle wasting and atrophy, not elsewhere classified, other site: Secondary | ICD-10-CM | POA: Diagnosis not present

## 2020-08-09 DIAGNOSIS — Z888 Allergy status to other drugs, medicaments and biological substances status: Secondary | ICD-10-CM

## 2020-08-09 DIAGNOSIS — M25473 Effusion, unspecified ankle: Secondary | ICD-10-CM

## 2020-08-09 DIAGNOSIS — R29810 Facial weakness: Secondary | ICD-10-CM | POA: Diagnosis not present

## 2020-08-09 DIAGNOSIS — I878 Other specified disorders of veins: Secondary | ICD-10-CM | POA: Diagnosis present

## 2020-08-09 DIAGNOSIS — R4182 Altered mental status, unspecified: Secondary | ICD-10-CM | POA: Diagnosis not present

## 2020-08-09 DIAGNOSIS — R6520 Severe sepsis without septic shock: Secondary | ICD-10-CM | POA: Diagnosis present

## 2020-08-09 DIAGNOSIS — E782 Mixed hyperlipidemia: Secondary | ICD-10-CM | POA: Diagnosis present

## 2020-08-09 DIAGNOSIS — N184 Chronic kidney disease, stage 4 (severe): Secondary | ICD-10-CM | POA: Diagnosis present

## 2020-08-09 DIAGNOSIS — G9341 Metabolic encephalopathy: Secondary | ICD-10-CM | POA: Diagnosis present

## 2020-08-09 DIAGNOSIS — N179 Acute kidney failure, unspecified: Secondary | ICD-10-CM | POA: Diagnosis present

## 2020-08-09 DIAGNOSIS — M19072 Primary osteoarthritis, left ankle and foot: Secondary | ICD-10-CM | POA: Diagnosis not present

## 2020-08-09 DIAGNOSIS — E11621 Type 2 diabetes mellitus with foot ulcer: Secondary | ICD-10-CM | POA: Diagnosis present

## 2020-08-09 DIAGNOSIS — R000 Tachycardia, unspecified: Secondary | ICD-10-CM | POA: Diagnosis not present

## 2020-08-09 DIAGNOSIS — Z79899 Other long term (current) drug therapy: Secondary | ICD-10-CM | POA: Diagnosis not present

## 2020-08-09 DIAGNOSIS — I10 Essential (primary) hypertension: Secondary | ICD-10-CM | POA: Diagnosis present

## 2020-08-09 DIAGNOSIS — I959 Hypotension, unspecified: Secondary | ICD-10-CM

## 2020-08-09 DIAGNOSIS — R52 Pain, unspecified: Secondary | ICD-10-CM

## 2020-08-09 DIAGNOSIS — N189 Chronic kidney disease, unspecified: Secondary | ICD-10-CM | POA: Diagnosis not present

## 2020-08-09 DIAGNOSIS — E669 Obesity, unspecified: Secondary | ICD-10-CM

## 2020-08-09 DIAGNOSIS — Z87891 Personal history of nicotine dependence: Secondary | ICD-10-CM

## 2020-08-09 DIAGNOSIS — S91102A Unspecified open wound of left great toe without damage to nail, initial encounter: Secondary | ICD-10-CM | POA: Diagnosis not present

## 2020-08-09 LAB — CBC WITH DIFFERENTIAL/PLATELET
Abs Immature Granulocytes: 0.13 10*3/uL — ABNORMAL HIGH (ref 0.00–0.07)
Basophils Absolute: 0 10*3/uL (ref 0.0–0.1)
Basophils Relative: 0 %
Eosinophils Absolute: 0 10*3/uL (ref 0.0–0.5)
Eosinophils Relative: 0 %
HCT: 31.9 % — ABNORMAL LOW (ref 39.0–52.0)
Hemoglobin: 10 g/dL — ABNORMAL LOW (ref 13.0–17.0)
Immature Granulocytes: 1 %
Lymphocytes Relative: 6 %
Lymphs Abs: 0.6 10*3/uL — ABNORMAL LOW (ref 0.7–4.0)
MCH: 30.8 pg (ref 26.0–34.0)
MCHC: 31.3 g/dL (ref 30.0–36.0)
MCV: 98.2 fL (ref 80.0–100.0)
Monocytes Absolute: 1 10*3/uL (ref 0.1–1.0)
Monocytes Relative: 10 %
Neutro Abs: 8.2 10*3/uL — ABNORMAL HIGH (ref 1.7–7.7)
Neutrophils Relative %: 83 %
Platelets: 219 10*3/uL (ref 150–400)
RBC: 3.25 MIL/uL — ABNORMAL LOW (ref 4.22–5.81)
RDW: 16.8 % — ABNORMAL HIGH (ref 11.5–15.5)
WBC: 9.9 10*3/uL (ref 4.0–10.5)
nRBC: 0 % (ref 0.0–0.2)

## 2020-08-09 LAB — COMPREHENSIVE METABOLIC PANEL
ALT: 20 U/L (ref 0–44)
AST: 28 U/L (ref 15–41)
Albumin: 3.1 g/dL — ABNORMAL LOW (ref 3.5–5.0)
Alkaline Phosphatase: 42 U/L (ref 38–126)
Anion gap: 15 (ref 5–15)
BUN: 104 mg/dL — ABNORMAL HIGH (ref 8–23)
CO2: 17 mmol/L — ABNORMAL LOW (ref 22–32)
Calcium: 9.6 mg/dL (ref 8.9–10.3)
Chloride: 108 mmol/L (ref 98–111)
Creatinine, Ser: 2.85 mg/dL — ABNORMAL HIGH (ref 0.61–1.24)
GFR, Estimated: 21 mL/min — ABNORMAL LOW (ref >60)
Glucose, Bld: 175 mg/dL — ABNORMAL HIGH (ref 70–99)
Potassium: 3.9 mmol/L (ref 3.5–5.1)
Sodium: 140 mmol/L (ref 135–145)
Total Bilirubin: 0.9 mg/dL (ref 0.3–1.2)
Total Protein: 6.4 g/dL — ABNORMAL LOW (ref 6.5–8.1)

## 2020-08-09 LAB — BLOOD GAS, VENOUS
Acid-base deficit: 5.5 mmol/L — ABNORMAL HIGH (ref 0.0–2.0)
Bicarbonate: 19.8 mmol/L — ABNORMAL LOW (ref 20.0–28.0)
Drawn by: 47107
FIO2: 21
O2 Saturation: 78.6 %
Patient temperature: 37
pCO2, Ven: 41.5 mmHg — ABNORMAL LOW (ref 44.0–60.0)
pH, Ven: 7.301 (ref 7.250–7.430)

## 2020-08-09 LAB — I-STAT CHEM 8, ED
BUN: 102 mg/dL — ABNORMAL HIGH (ref 8–23)
Calcium, Ion: 1.29 mmol/L (ref 1.15–1.40)
Chloride: 108 mmol/L (ref 98–111)
Creatinine, Ser: 2.8 mg/dL — ABNORMAL HIGH (ref 0.61–1.24)
Glucose, Bld: 171 mg/dL — ABNORMAL HIGH (ref 70–99)
HCT: 31 % — ABNORMAL LOW (ref 39.0–52.0)
Hemoglobin: 10.5 g/dL — ABNORMAL LOW (ref 13.0–17.0)
Potassium: 3.9 mmol/L (ref 3.5–5.1)
Sodium: 143 mmol/L (ref 135–145)
TCO2: 19 mmol/L — ABNORMAL LOW (ref 22–32)

## 2020-08-09 LAB — URINALYSIS, ROUTINE W REFLEX MICROSCOPIC
Bacteria, UA: NONE SEEN
Bilirubin Urine: NEGATIVE
Glucose, UA: NEGATIVE mg/dL
Hgb urine dipstick: NEGATIVE
Ketones, ur: NEGATIVE mg/dL
Leukocytes,Ua: NEGATIVE
Nitrite: NEGATIVE
Protein, ur: 30 mg/dL — AB
Specific Gravity, Urine: 1.012 (ref 1.005–1.030)
pH: 5 (ref 5.0–8.0)

## 2020-08-09 LAB — LACTIC ACID, PLASMA
Lactic Acid, Venous: 4.1 mmol/L (ref 0.5–1.9)
Lactic Acid, Venous: 2.8 mmol/L (ref 0.5–1.9)

## 2020-08-09 LAB — APTT: aPTT: 27 seconds (ref 24–36)

## 2020-08-09 LAB — LIPASE, BLOOD: Lipase: 69 U/L — ABNORMAL HIGH (ref 11–51)

## 2020-08-09 LAB — CK: Total CK: 362 U/L (ref 49–397)

## 2020-08-09 LAB — PROTIME-INR
INR: 1.2 (ref 0.8–1.2)
Prothrombin Time: 14.9 seconds (ref 11.4–15.2)

## 2020-08-09 LAB — MAGNESIUM: Magnesium: 2.1 mg/dL (ref 1.7–2.4)

## 2020-08-09 LAB — TROPONIN I (HIGH SENSITIVITY)
Troponin I (High Sensitivity): 112 ng/L (ref <18)
Troponin I (High Sensitivity): 114 ng/L (ref <18)

## 2020-08-09 LAB — RESP PANEL BY RT-PCR (FLU A&B, COVID) ARPGX2
Influenza A by PCR: NEGATIVE
Influenza B by PCR: NEGATIVE
SARS Coronavirus 2 by RT PCR: NEGATIVE

## 2020-08-09 LAB — GLUCOSE, CAPILLARY: Glucose-Capillary: 103 mg/dL — ABNORMAL HIGH (ref 70–99)

## 2020-08-09 IMAGING — CT CT HEAD W/O CM
4 series · 16 of 47 positions shown, 18 images · non-contrast
Comparison: [DATE]

CLINICAL DATA: Altered mental status

EXAM:
CT HEAD WITHOUT CONTRAST
TECHNIQUE: Contiguous axial images were obtained from the base of the skull
through the vertex without intravenous contrast.

[Series 3: head without · axial · non-contrast · 0.44mm/px · z∈[-98,+22]mm · 7 of 34 slices shown, 9 images]
[im 5/34  brain]
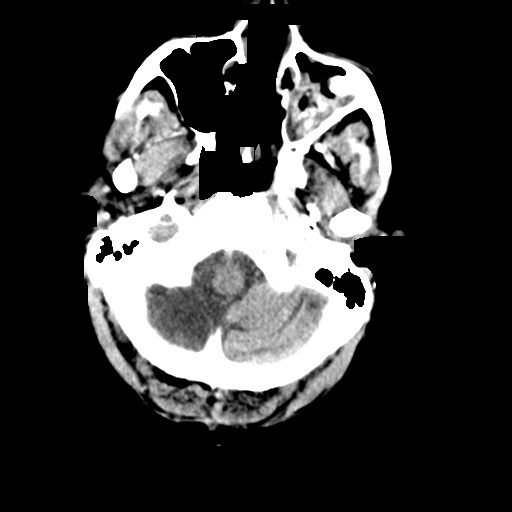
[im 5/34  bone]
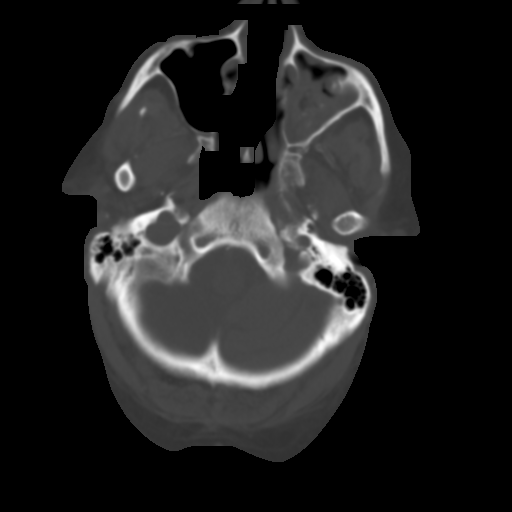
[im 9/34  brain]
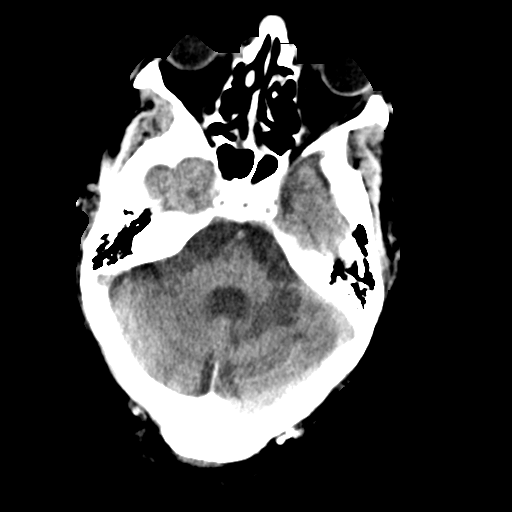
[im 13/34  brain]
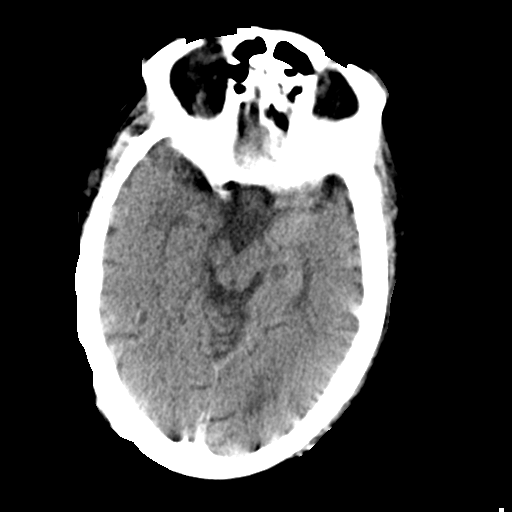
[im 17/34  brain]
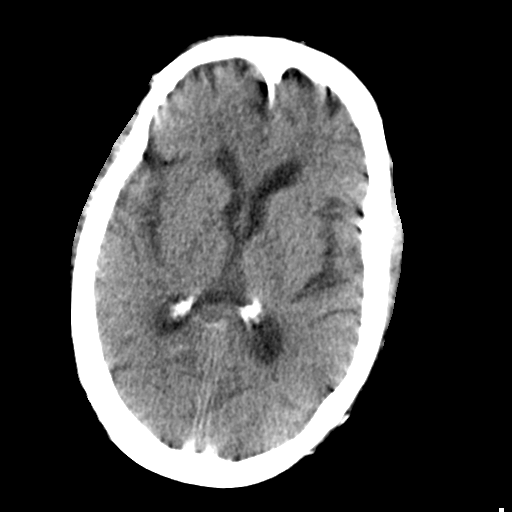
[im 21/34  brain]
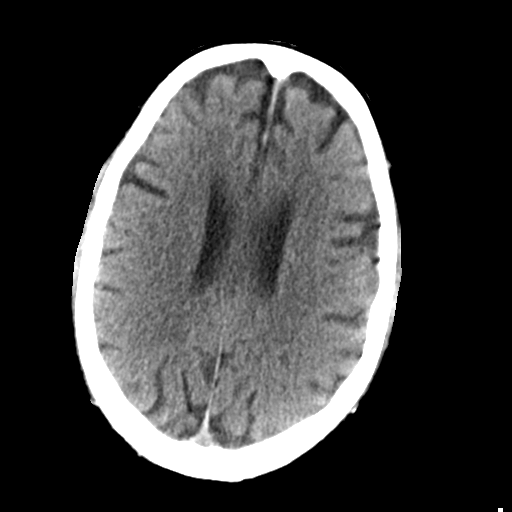
[im 21/34  bone]
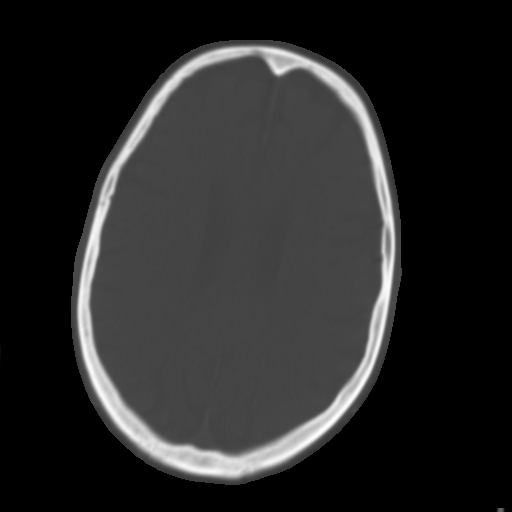
[im 25/34  brain]
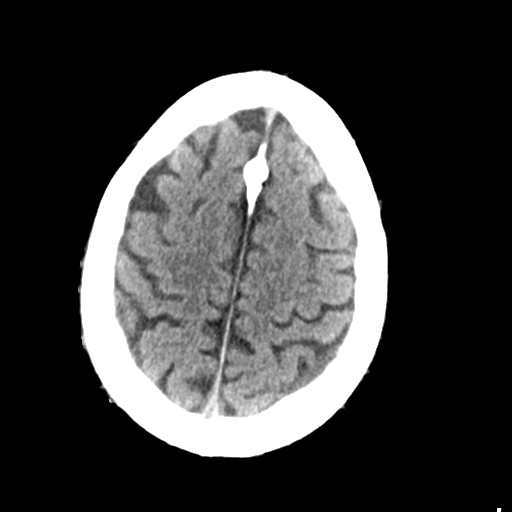
[im 29/34  brain]
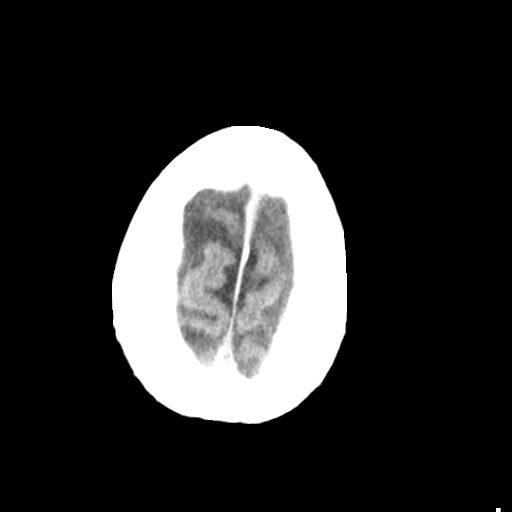

[Series 4: head bone · axial · 0.44mm/px · z∈[-102,-68]mm · 3 of 85 slices shown]
[im 9/85  bone]
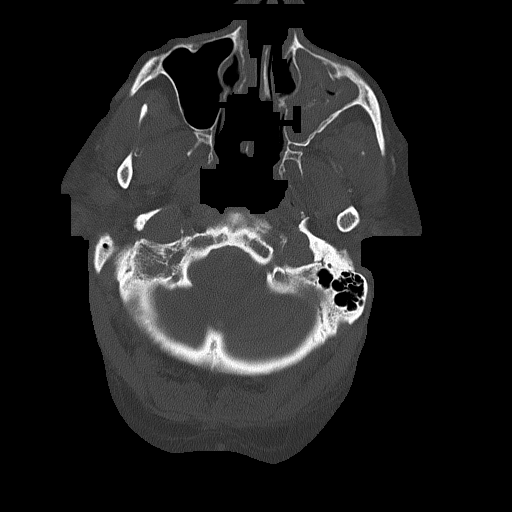
[im 17/85  bone]
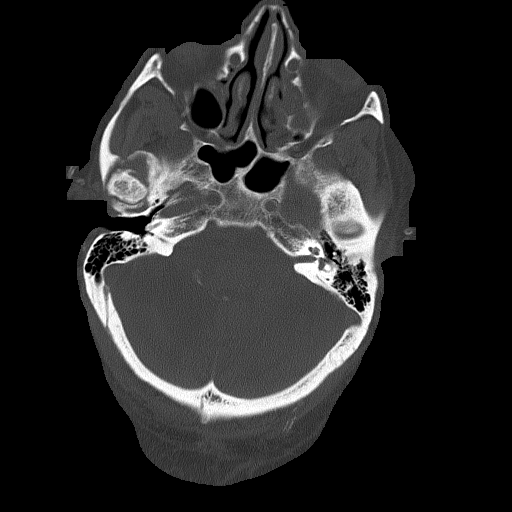
[im 26/85  bone]
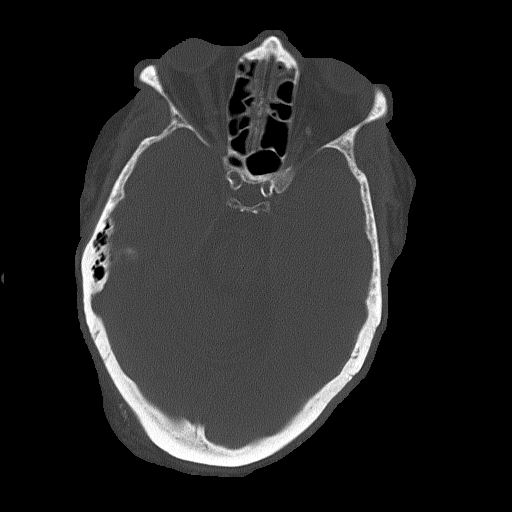

[Series 5: head without cor · coronal · non-contrast · 0.37mm/px · 3 of 76 slices shown]
[im 26/76  brain]
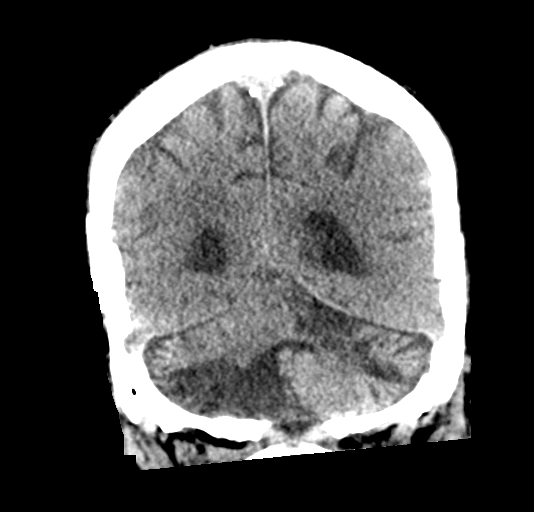
[im 34/76  brain]
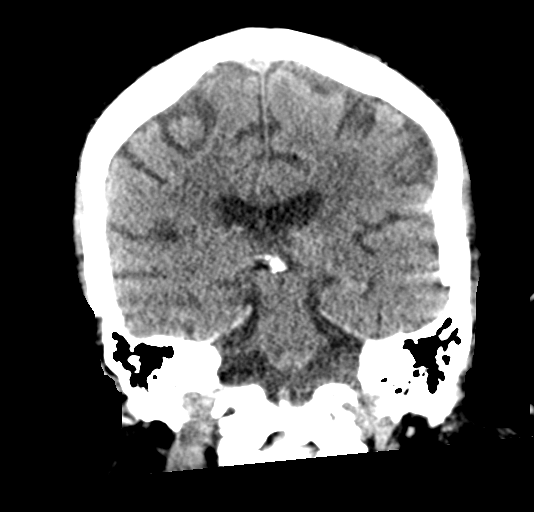
[im 42/76  brain]
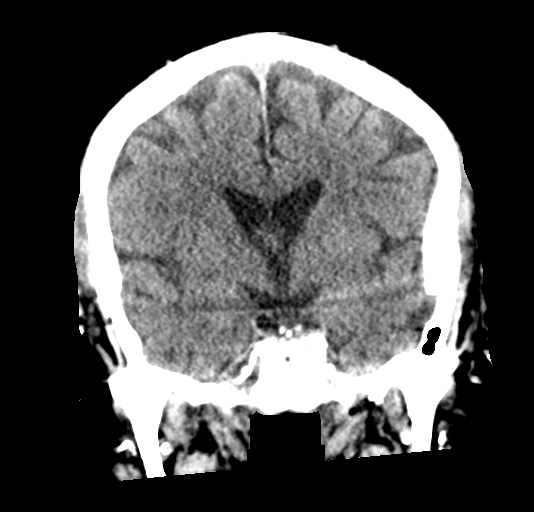

[Series 6: head without sag · sagittal · non-contrast · 0.37mm/px · 3 of 64 slices shown]
[im 25/64  brain]
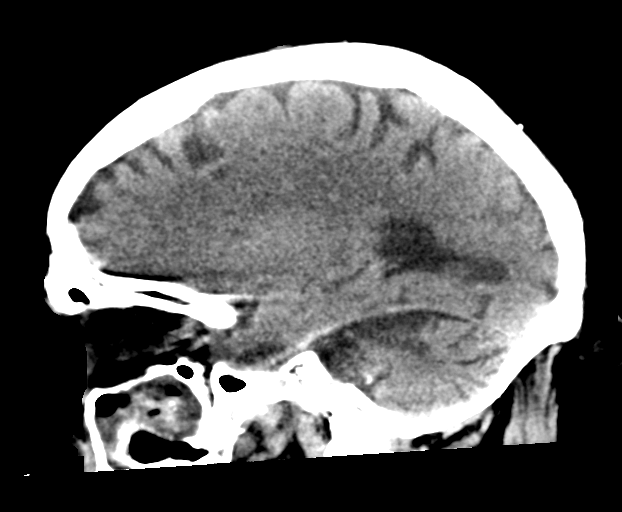
[im 33/64  brain]
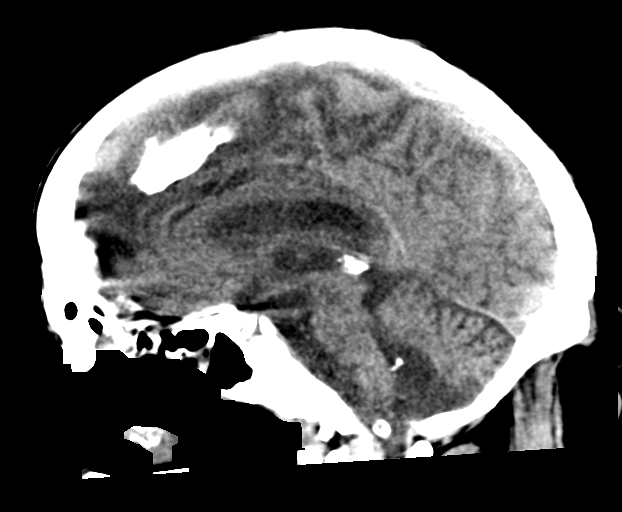
[im 42/64  brain]
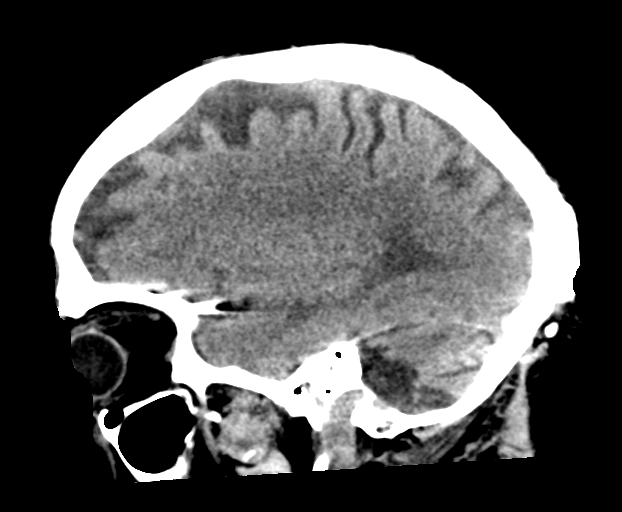

[16 of 47 positions shown; findings below may reference images not displayed]

FINDINGS: Evaluation is limited secondary to patient motion.

Brain: Revisualization of sequela of remote prior infarction of the
pons and bilateral cerebellum. No definitive acute infarction within
the limitations of this mildly motion degraded exam. No acute
hemorrhage. Periventricular white matter hypodensities consistent
with sequela of chronic microvascular ischemic disease.

Vascular: Vascular calcifications.

Skull: No acute fracture within the limitations of the exam.

Sinuses/Orbits: Near complete opacification of the LEFT maxillary
sinus with osseous sequela of chronic sinusitis. Internal
calcifications and high density likely reflecting chronic fungal
colonization.

Other: None.
IMPRESSION: 1. No definitive acute intracranial abnormality within the
limitations of this mildly motion degraded exam.
2. Revisualization of sequela of remote prior infarction of the pons
and bilateral cerebellum.
3. Chronic LEFT maxillary sinusitis with likely chronic fungal
colonization.

## 2020-08-09 IMAGING — DX DG CHEST 1V PORT
1 series · 1 of 1 positions shown · non-contrast
Comparison: [DATE]

CLINICAL DATA: Sepsis

EXAM:
PORTABLE CHEST 1 VIEW

[chest ap]
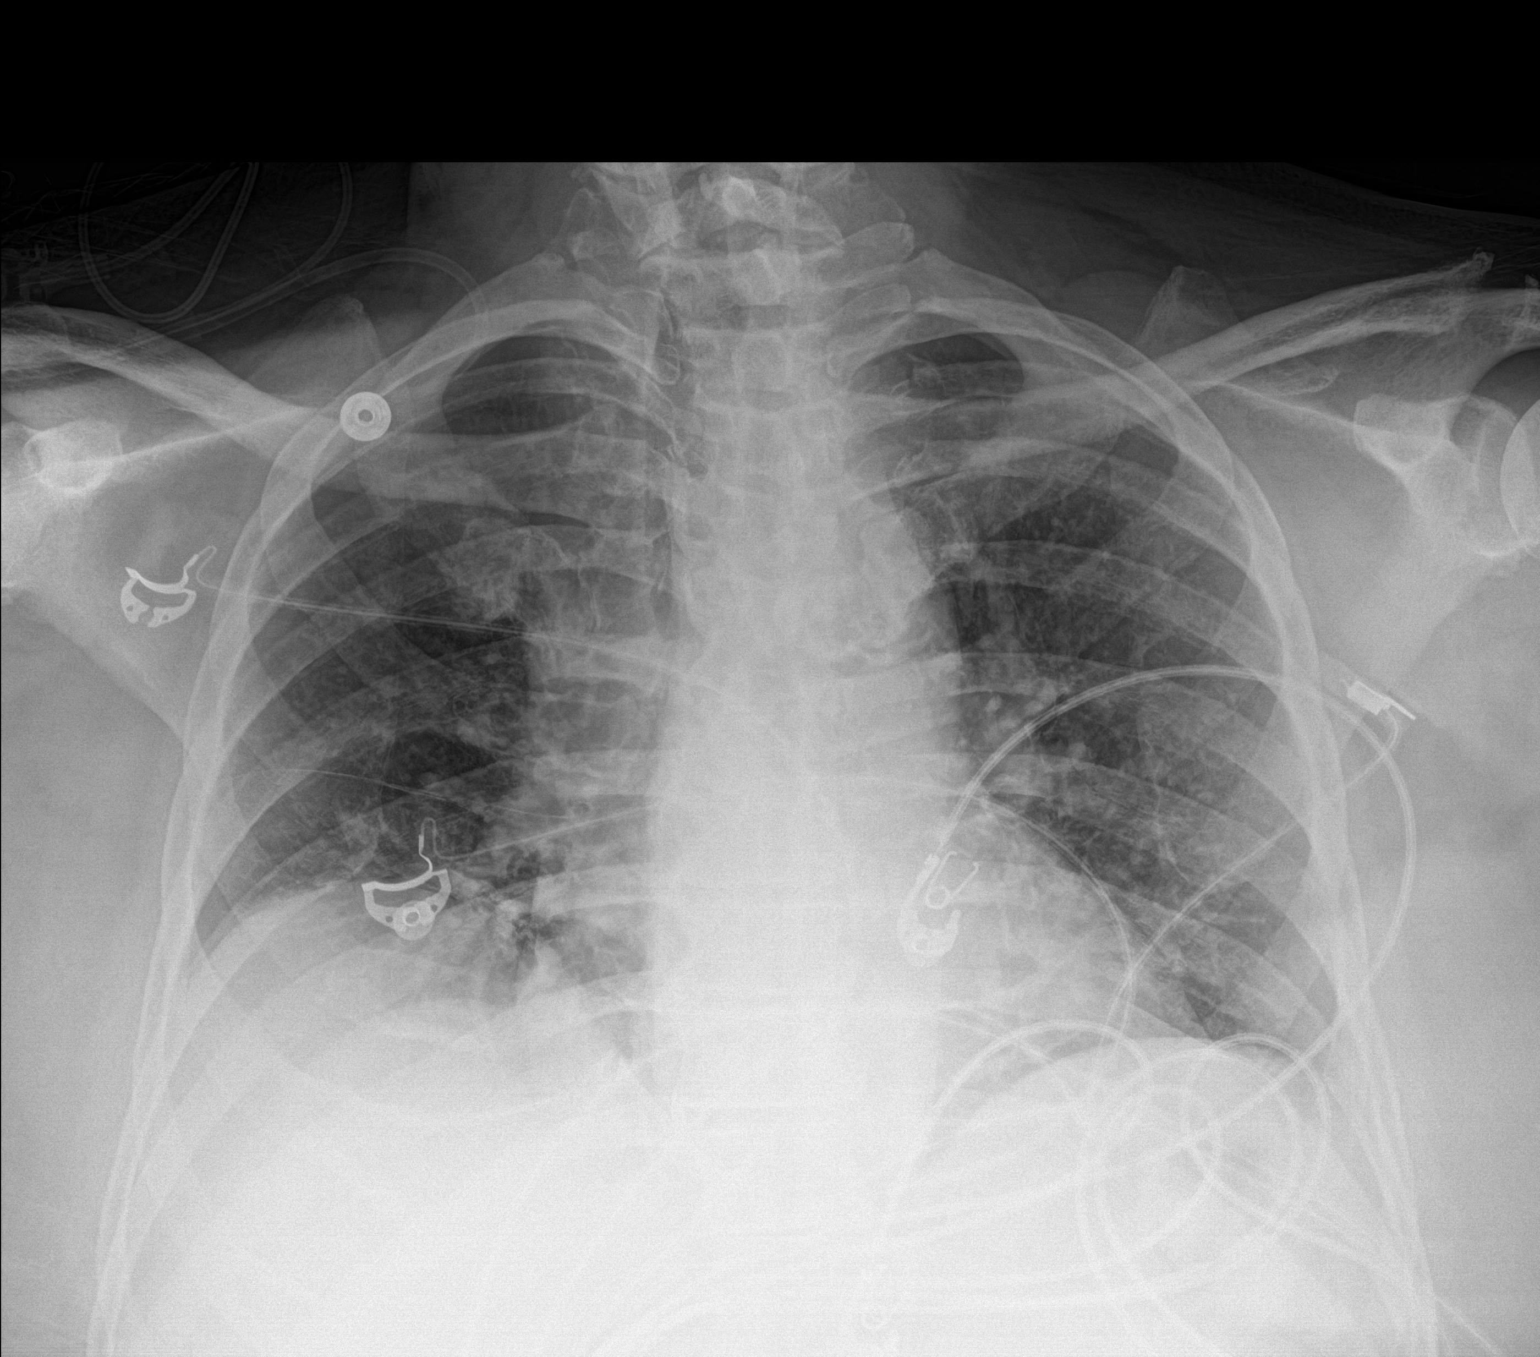

[1 of 1 positions shown; findings below may reference images not displayed]

FINDINGS: The cardiomediastinal silhouette is unchanged in contour when
accounting for patient rotation.Atherosclerotic calcifications of
the RIGHT carotid. No pleural effusion. No pneumothorax. No acute
pleuroparenchymal abnormality. Visualized abdomen is unremarkable.
Multilevel degenerative changes of the thoracic spine.
IMPRESSION: No acute cardiopulmonary abnormality.

## 2020-08-09 IMAGING — DX DG FOOT 2V*L*
2 series · 2 of 2 positions shown · non-contrast
Comparison: None.

CLINICAL DATA: Left great toe wound.

EXAM:
LEFT FOOT - 2 VIEW

[foot ap]
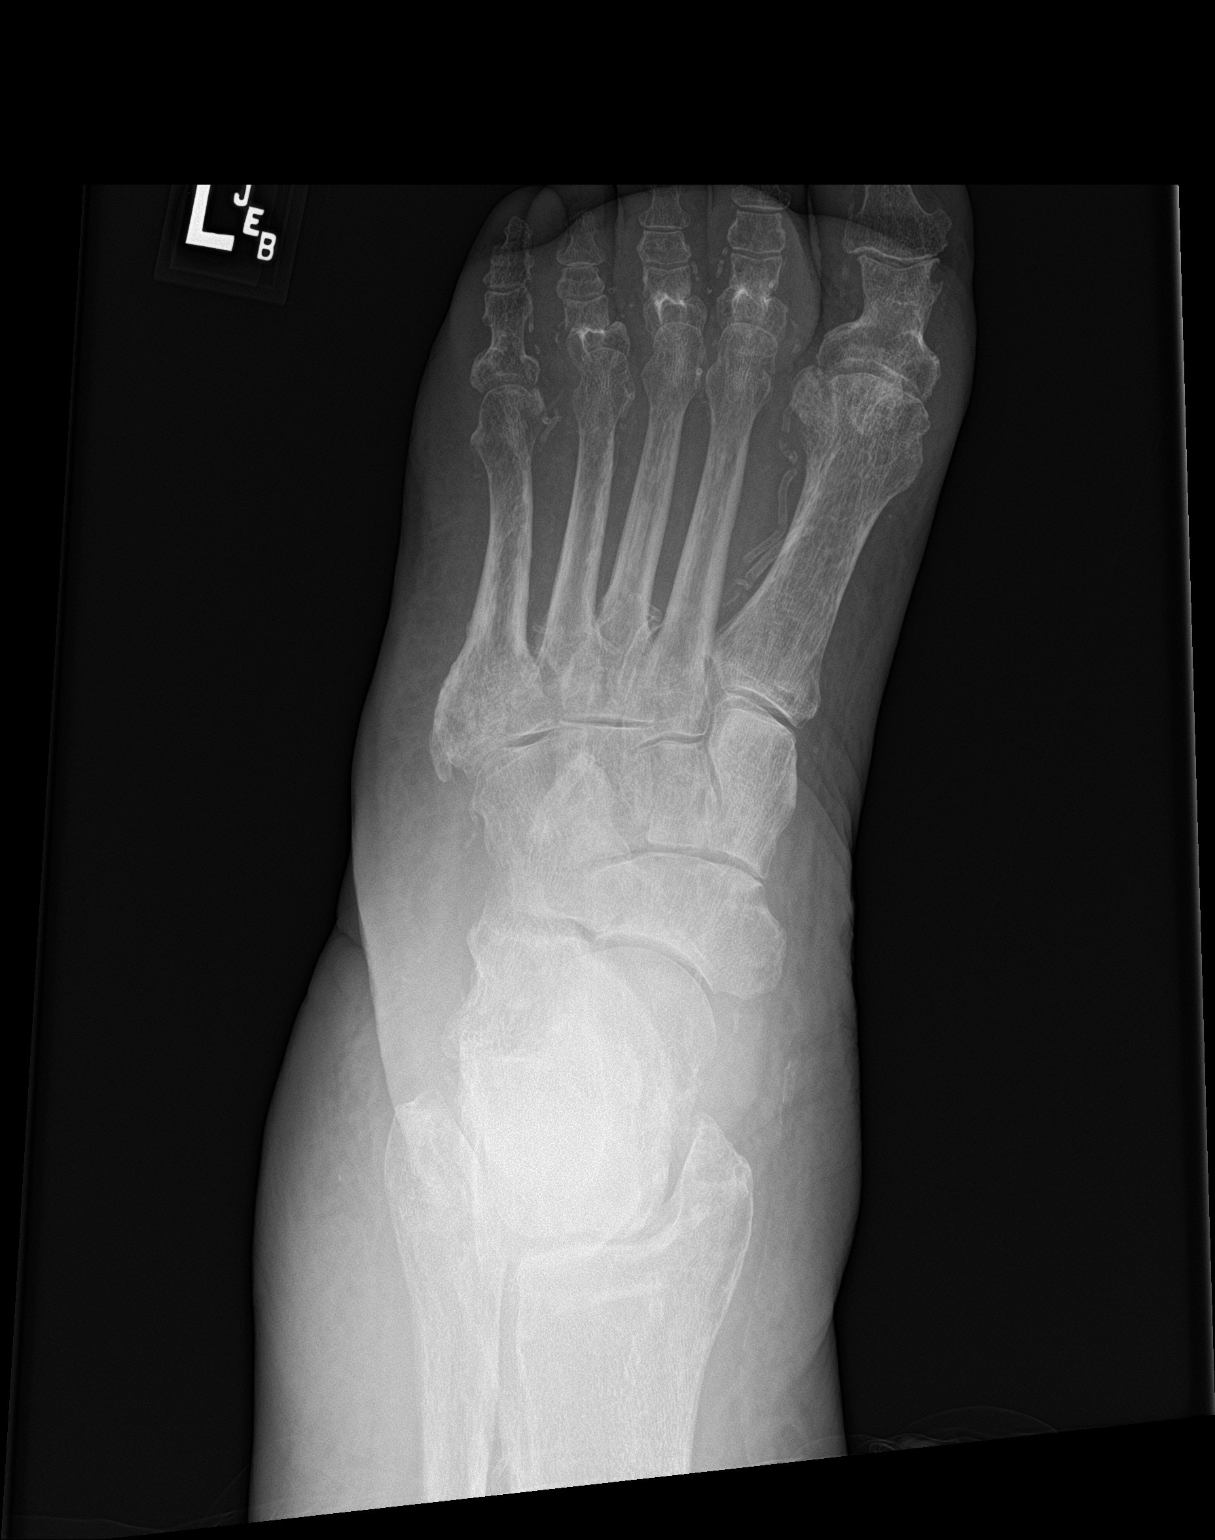

[foot lat]
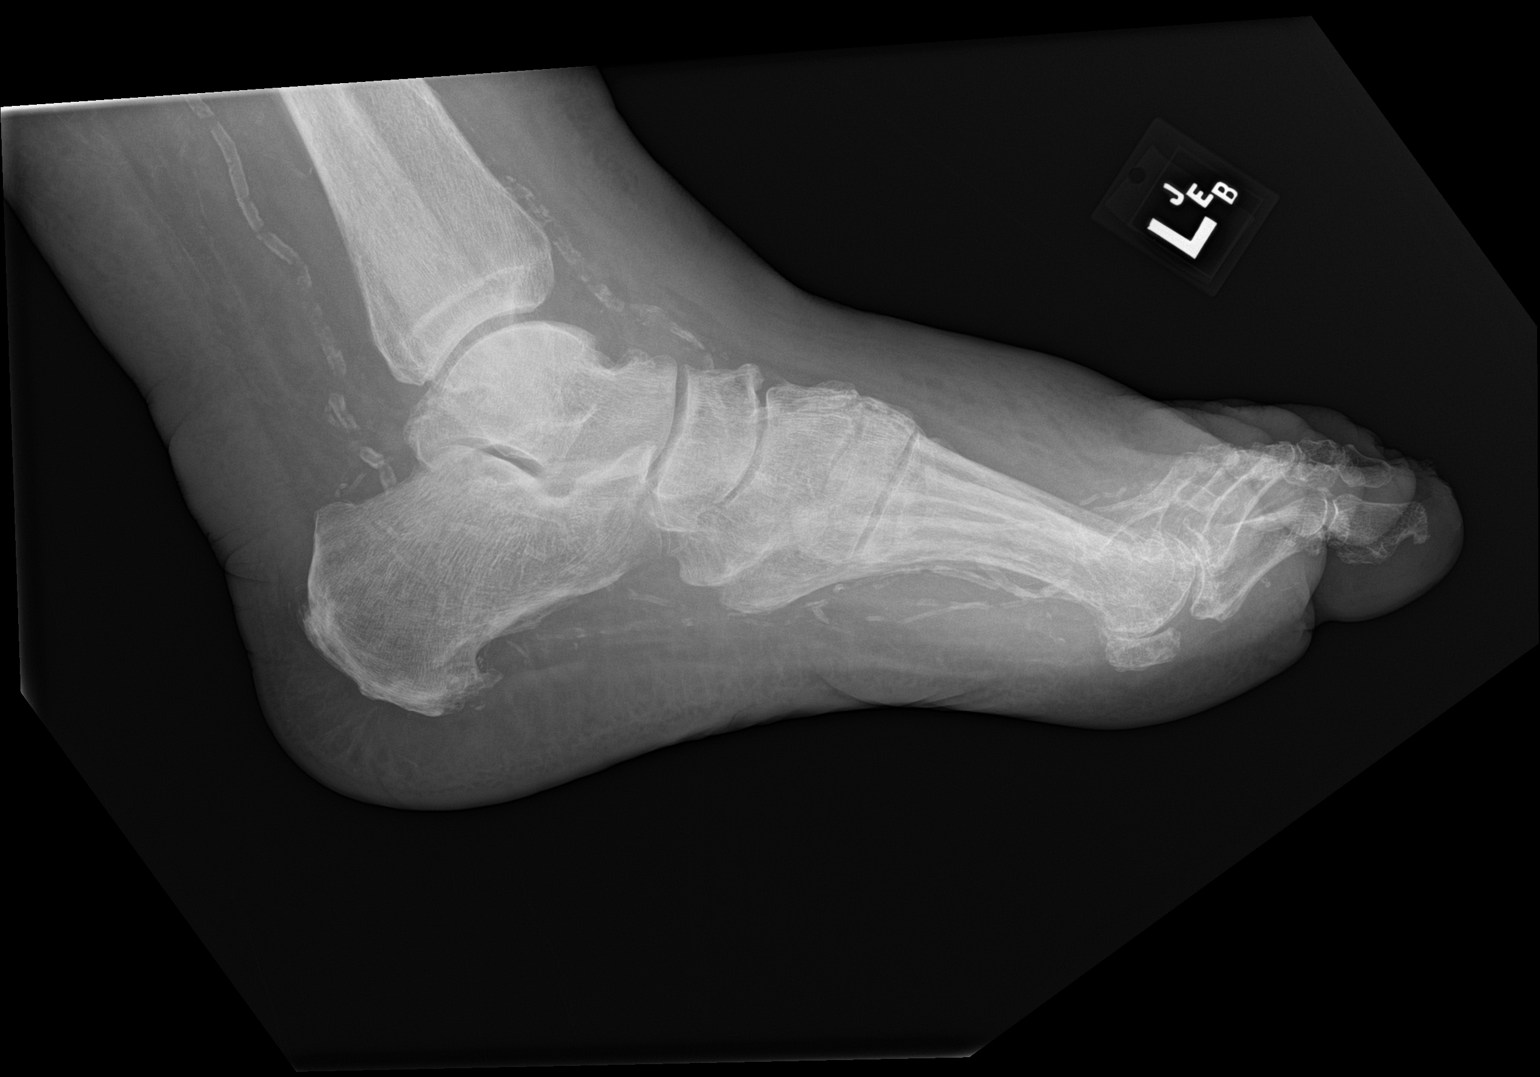

[2 of 2 positions shown; findings below may reference images not displayed]

FINDINGS: There is no evidence of fracture or dislocation. There is no
evidence of cortical destructive changes. Left first digit tuft
wound. Diffuse soft tissue swelling, particularly dorsally.
IMPRESSION: 1. No acute fracture or dislocation identified about the left foot.
2. Left first digit tuft wound.
3. Diffuse soft tissue swelling, particularly dorsally.

## 2020-08-09 IMAGING — DX DG TIBIA/FIBULA 2V*L*
4 series · 4 of 4 positions shown · non-contrast
Comparison: None.

CLINICAL DATA: Cellulitis.

EXAM:
LEFT TIBIA AND FIBULA - 2 VIEW

[tibia ap (1 of 2)]
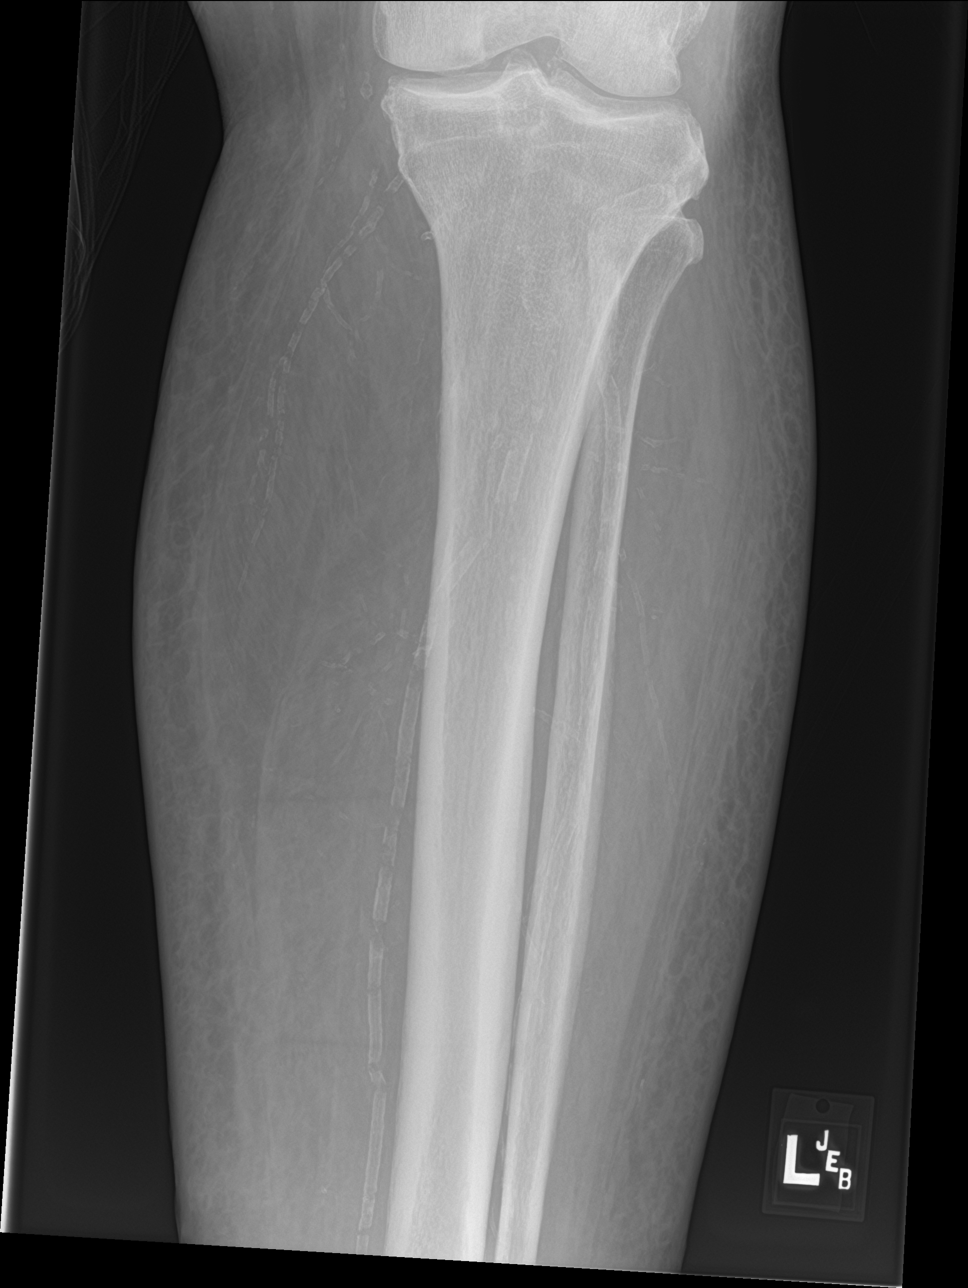

[tibia ap (2 of 2)]
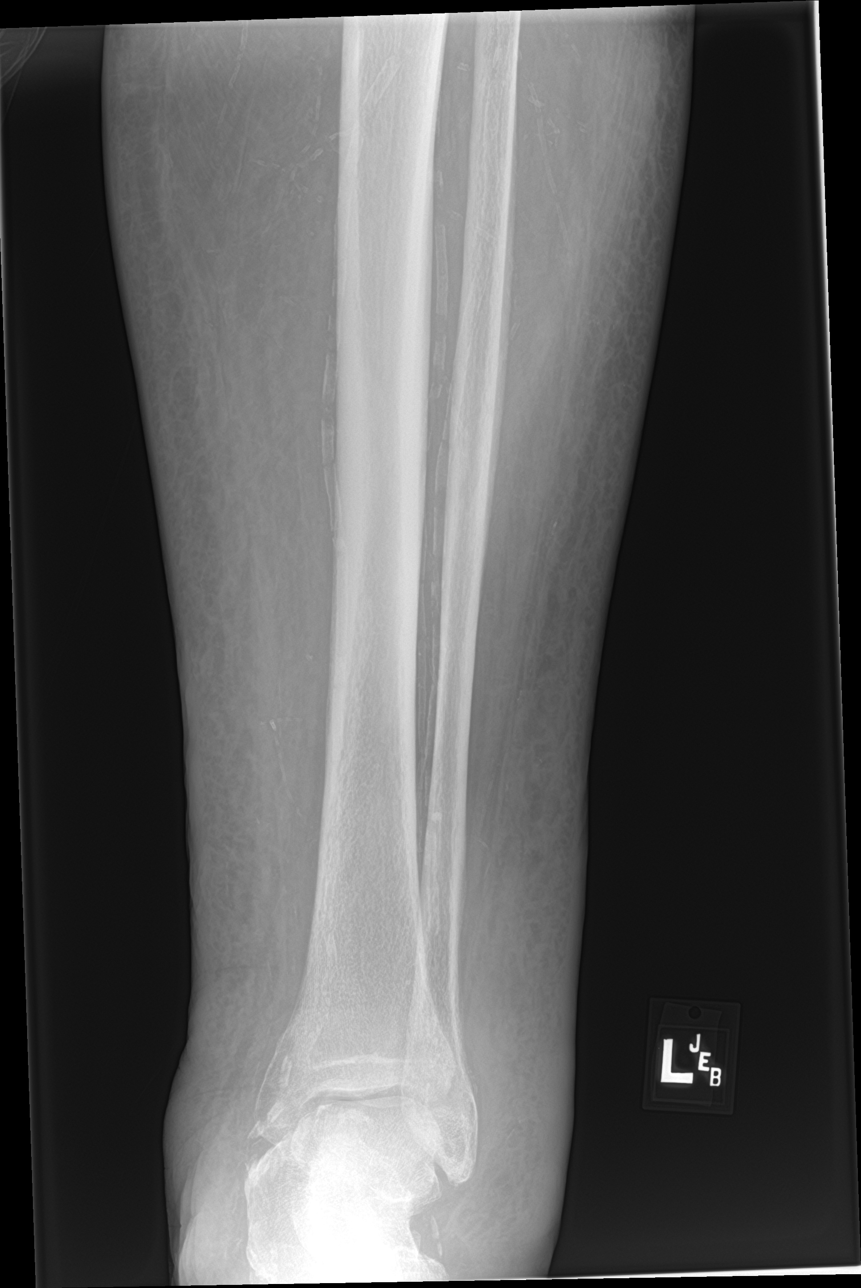

[tibia lat (1 of 2)]
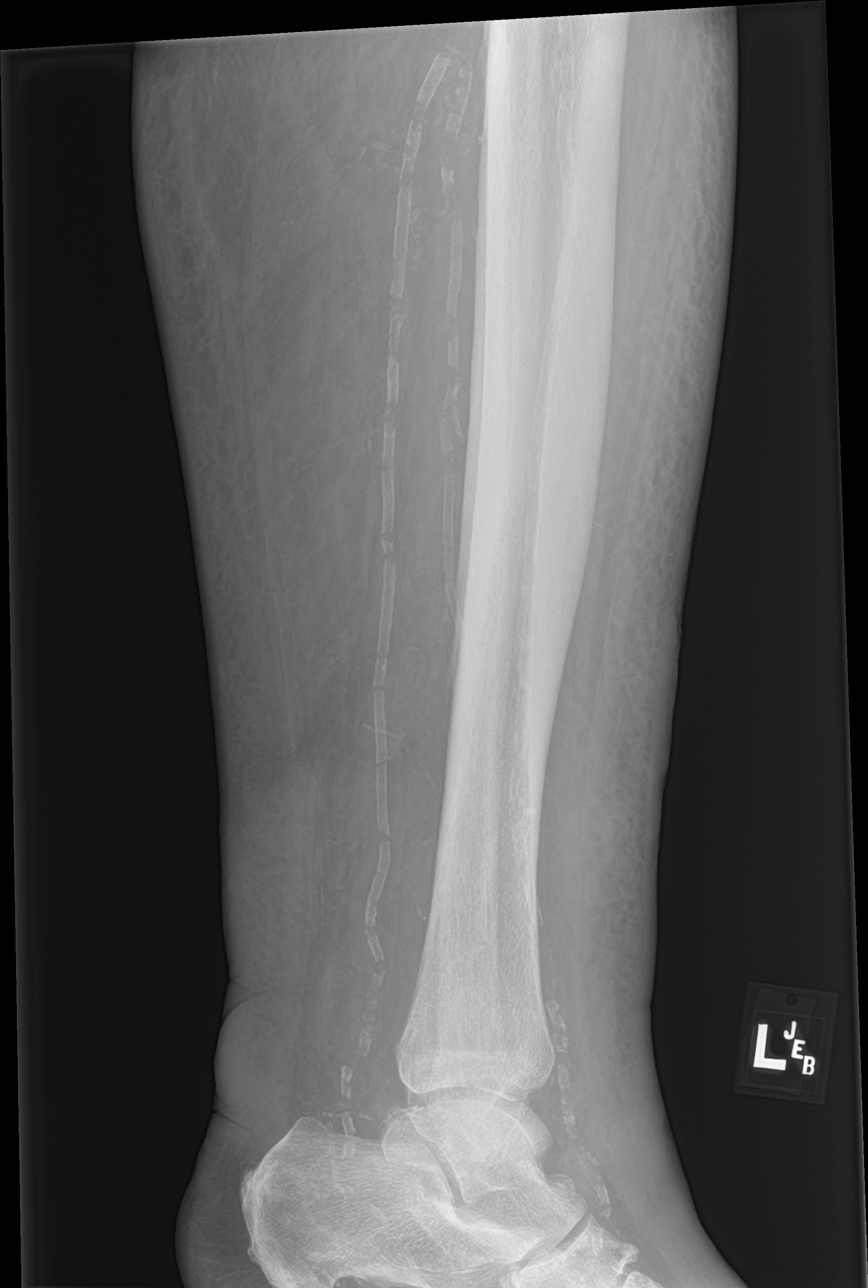

[tibia lat (2 of 2)]
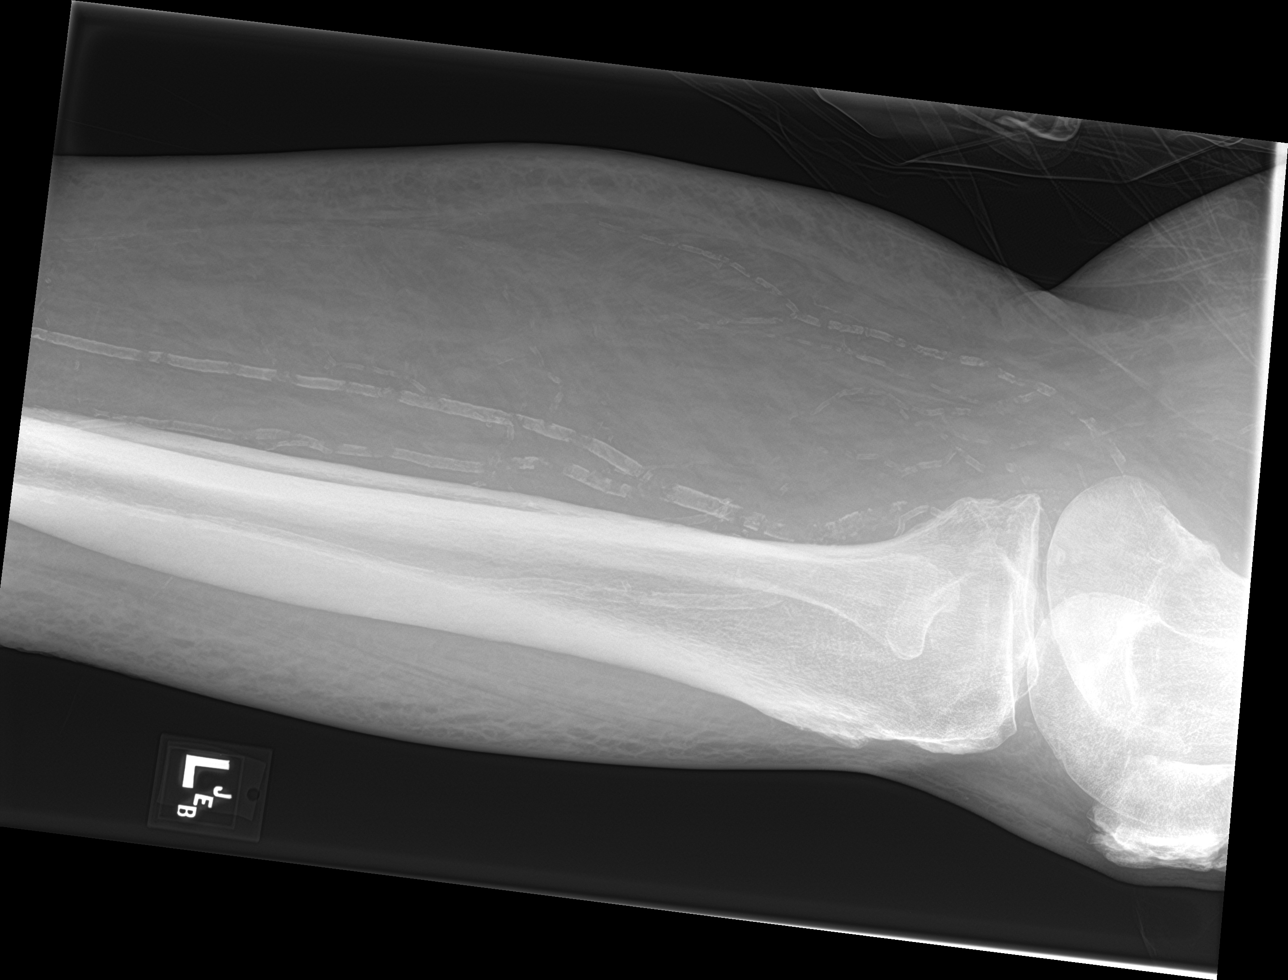

[4 of 4 positions shown; findings below may reference images not displayed]

FINDINGS: There is no evidence of fracture or other focal bone lesions.
Diffuse soft tissue swelling of the left lower extremity. No soft
tissue emphysema seen. Heavy vascular calcifications.
IMPRESSION: 1. No acute fracture or dislocation identified about the left
tibia/fibula.
2. Diffuse soft tissue swelling.

## 2020-08-09 MED ORDER — GABAPENTIN 100 MG PO CAPS: 100.0000 mg | ORAL_CAPSULE | Freq: Three times a day (TID) | ORAL | Status: DC

## 2020-08-09 MED ORDER — PANTOPRAZOLE SODIUM 40 MG PO TBEC
40.0000 mg | DELAYED_RELEASE_TABLET | Freq: Every day | ORAL | Status: DC
  Administered 2020-08-09 – 2020-08-12 (×4): 40 mg via ORAL
  Filled 2020-08-09 (×4): qty 1

## 2020-08-09 MED ORDER — CLOPIDOGREL BISULFATE 75 MG PO TABS
75.0000 mg | ORAL_TABLET | Freq: Every day | ORAL | Status: DC
  Administered 2020-08-10 – 2020-08-13 (×4): 75 mg via ORAL
  Filled 2020-08-09 (×4): qty 1

## 2020-08-09 MED ORDER — SODIUM CHLORIDE 0.9 % IV BOLUS (SEPSIS)
1000.0000 mL | Freq: Once | INTRAVENOUS | Status: AC
  Administered 2020-08-09: 1000 mL via INTRAVENOUS

## 2020-08-09 MED ORDER — ASPIRIN EC 81 MG PO TBEC
81.0000 mg | DELAYED_RELEASE_TABLET | Freq: Every day | ORAL | Status: DC
  Administered 2020-08-10 – 2020-08-13 (×4): 81 mg via ORAL
  Filled 2020-08-09 (×5): qty 1

## 2020-08-09 MED ORDER — HYDROMORPHONE HCL 1 MG/ML IJ SOLN
0.5000 mg | INTRAMUSCULAR | Status: DC | PRN
  Filled 2020-08-09: qty 1

## 2020-08-09 MED ORDER — ONDANSETRON HCL 4 MG/2ML IJ SOLN: 4.0000 mg | Freq: Four times a day (QID) | INTRAMUSCULAR | Status: DC | PRN

## 2020-08-09 MED ORDER — SODIUM CHLORIDE 0.9 % IV BOLUS
1000.0000 mL | Freq: Once | INTRAVENOUS | Status: AC
  Administered 2020-08-09: 1000 mL via INTRAVENOUS

## 2020-08-09 MED ORDER — LACTATED RINGERS IV SOLN: INTRAVENOUS | Status: DC

## 2020-08-09 MED ORDER — HEPARIN SODIUM (PORCINE) 5000 UNIT/ML IJ SOLN
5000.0000 [IU] | Freq: Two times a day (BID) | INTRAMUSCULAR | Status: DC
  Administered 2020-08-09 – 2020-08-13 (×8): 5000 [IU] via SUBCUTANEOUS
  Filled 2020-08-09 (×9): qty 1

## 2020-08-09 MED ORDER — LINEZOLID 600 MG/300ML IV SOLN
600.0000 mg | Freq: Two times a day (BID) | INTRAVENOUS | Status: DC
  Administered 2020-08-09 – 2020-08-10 (×4): 600 mg via INTRAVENOUS
  Filled 2020-08-09 (×8): qty 300

## 2020-08-09 MED ORDER — SODIUM CHLORIDE 0.9 % IV SOLN
2.0000 g | Freq: Once | INTRAVENOUS | Status: AC
  Administered 2020-08-09: 2 g via INTRAVENOUS
  Filled 2020-08-09: qty 2

## 2020-08-09 MED ORDER — METRONIDAZOLE IN NACL 5-0.79 MG/ML-% IV SOLN
500.0000 mg | Freq: Once | INTRAVENOUS | Status: AC
  Administered 2020-08-09: 500 mg via INTRAVENOUS
  Filled 2020-08-09: qty 100

## 2020-08-09 MED ORDER — ACETAMINOPHEN 325 MG PO TABS
650.0000 mg | ORAL_TABLET | Freq: Four times a day (QID) | ORAL | Status: DC | PRN
  Administered 2020-08-10: 650 mg via ORAL
  Filled 2020-08-09: qty 2

## 2020-08-09 MED ORDER — SENNOSIDES-DOCUSATE SODIUM 8.6-50 MG PO TABS: 1.0000 | ORAL_TABLET | Freq: Every evening | ORAL | Status: DC | PRN

## 2020-08-09 MED ORDER — INSULIN ASPART 100 UNIT/ML ~~LOC~~ SOLN
0.0000 [IU] | Freq: Three times a day (TID) | SUBCUTANEOUS | Status: DC
  Administered 2020-08-10: 1 [IU] via SUBCUTANEOUS
  Administered 2020-08-12 – 2020-08-13 (×3): 2 [IU] via SUBCUTANEOUS

## 2020-08-09 MED ORDER — SODIUM CHLORIDE 0.9 % IV SOLN: INTRAVENOUS | Status: DC

## 2020-08-09 MED ORDER — ROSUVASTATIN CALCIUM 5 MG PO TABS
10.0000 mg | ORAL_TABLET | Freq: Every day | ORAL | Status: DC
  Administered 2020-08-10 – 2020-08-13 (×4): 10 mg via ORAL
  Filled 2020-08-09 (×4): qty 2

## 2020-08-09 MED ORDER — FERROUS SULFATE 325 (65 FE) MG PO TABS
325.0000 mg | ORAL_TABLET | ORAL | Status: DC
  Administered 2020-08-10 – 2020-08-12 (×2): 325 mg via ORAL
  Filled 2020-08-09 (×2): qty 1

## 2020-08-09 MED ORDER — GABAPENTIN 100 MG PO CAPS
100.0000 mg | ORAL_CAPSULE | Freq: Two times a day (BID) | ORAL | Status: DC
  Administered 2020-08-10 – 2020-08-13 (×6): 100 mg via ORAL
  Filled 2020-08-09 (×6): qty 1

## 2020-08-09 MED ORDER — ACETAMINOPHEN 650 MG RE SUPP: 650.0000 mg | Freq: Four times a day (QID) | RECTAL | Status: DC | PRN

## 2020-08-09 MED ORDER — LATANOPROST 0.005 % OP SOLN
1.0000 [drp] | Freq: Every day | OPHTHALMIC | Status: DC
  Administered 2020-08-09 – 2020-08-12 (×4): 1 [drp] via OPHTHALMIC
  Filled 2020-08-09: qty 2.5

## 2020-08-09 MED ORDER — SODIUM CHLORIDE 0.9 % IV SOLN
2.0000 g | INTRAVENOUS | Status: DC
  Administered 2020-08-10: 2 g via INTRAVENOUS
  Filled 2020-08-09: qty 2

## 2020-08-09 MED ORDER — ONDANSETRON HCL 4 MG PO TABS: 4.0000 mg | ORAL_TABLET | Freq: Four times a day (QID) | ORAL | Status: DC | PRN

## 2020-08-09 NOTE — ED Provider Notes (Signed)
I received the patient in signout from Dr. Billy Fischer, briefly the patient is a 85 year old male with a chief complaints of possible sepsis.  Was recently admitted for similar presentation.  Initial lactate elevation had some improvement with IV fluids.  Persistently hypotensive and is awaiting critical care evaluation.  Local care has seen the patient and feels that his blood pressures have improved and could go to the hospitalist service.  I discussed case with the hospitalist.   Deno Etienne, DO 08/09/20 1609

## 2020-08-09 NOTE — Consult Note (Signed)
WOC Nurse Consult Note: Reason for Consult: Left toe wound, chronic, nonhealing.  Patient is followed in the community by Dr. Laurene Footman of the outpatient wound care center (last seen on 07/27/20) and was seen by Podiatry, Dr. Adela Ports two days ago on 08/07/20 for toenail debridement on the left foot, digits 2-5 and has diagnosis of onychomycosis. 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 The additional diagnosis of suspected osteomyelitis is outside the scope of [place].  Patient last seen by our department and my associate S. Doty on 07/20/20.  Wound type: full thickness Pressure Injury POA: N/A Dressing procedure/placement/frequency: Wound has been treated most recently with either hydrofera blue (an antimicrobial) and collagen.  I will implement a conservative POC today using twice daily xeroform (an antimicrobial nonadherent).  Recommend consultation with Podiatry while an inpatient or Vascular Surgery if you feel it would be more beneficial.  [place] nursing team will not follow, but will remain available to this patient, the nursing and medical teams.  Please re-consult if needed. Thanks, Maudie Flakes, MSN, RN, [place], Arther Abbott  Pager# [phone]

## 2020-08-09 NOTE — ED Notes (Signed)
Assisted with urinal to attempt to get urine spec. 1 watch , 2 gold colored necklaces placed in a bag and placed in a patient belonging bag.

## 2020-08-09 NOTE — Progress Notes (Signed)
Pharmacy Antibiotic Note  Micheal Walls is a 85 y.o. male admitted on 08/09/2020 with sepsis.  Pharmacy has been consulted for Cefepime dosing.   Height: $Remove'6\' 1"'VKgBZZz$  (185.4 cm) Weight: 127 kg (280 lb) IBW/kg (Calculated) : 79.9  Temp (24hrs), Avg:98.5 F (36.9 C), Min:98.5 F (36.9 C), Max:98.5 F (36.9 C)  Recent Labs  Lab 08/09/20 0858 08/09/20 0954  WBC 9.9  --   CREATININE  --  2.80*    Estimated Creatinine Clearance: 28.4 mL/min (A) (by C-G formula based on SCr of 2.8 mg/dL (H)).    Allergies  Allergen Reactions  . Vancomycin Rash    Antimicrobials this admission: 2/27 Cefepime >>  2/27 Zyvox >>  Dose adjustments this admission: N/a  Microbiology results: Pending   Plan:  - Cefepime 2g IV q24h - Monitor patients renal function and urine output  - De-escalate ABX when appropriate   Thank you for allowing pharmacy to be a part of this patient's care.  Duanne Limerick PharmD. BCPS 08/09/2020 10:47 AM

## 2020-08-09 NOTE — Consult Note (Addendum)
NAME:  Micheal Walls, MRN:  [id number], DOB:  Oct 05, 1935, LOS: 0 ADMISSION DATE:  08/09/2020, CONSULTATION DATE:  08/09/20 REFERRING MD: Billy Fischer - EM , CHIEF COMPLAINT:  Weakness  Brief History:  85 yo M recently admitted for AKI, sepsis in setting of L foot wound, weakness, who presents to ED with very similar picture: weakness, cellulitis, AKI, borderline hypotension.  History of Present Illness:  85 yo M PMH prior CVA, DM, s/p R BKA, recent skin/soft tissue infection, non-healing foot wound with concern for possible osteomyelitis (recently admitted 2/5-2/9) who presents to ED 2/27 with CC weakness and myalgia. Pt slid from bed this morning to the ground and felt too weak to get himself off the ground, remaining on the ground x1 hr. Endorses associated generalized myalgia, malaise and very poor PO intake. Feels like this is "just like before," referencing recent hospitalization. In ED, pt hypotensive and with concern for sepsis, administered 4L NS with improvement in BP. On interview with PCCM, pt with mildly slurred speech, which son feels is slightly worse than baseline.    Presenting labs significant for: Na 140, Glu 175 Cr 2.85 CO2 17, CK 362, WBC 4.1, WBC 9.9  CCM consulted for possible admission in setting of hypotension   Past Medical History:  R BKA Anemia Diastolic HF Diverticulitis DM Gout HLD OSA on CPAP RBBB L hallux ulcer Subacute right frontal infarct (CVA)  AKI HTN   Significant Hospital Events:  2/27 presented to ED. Received 4L IVF in ED for hypotension, PCCM consulted. BP has improved  Consults:  CCM  Procedures:    Significant Diagnostic Tests:  2/27VAS [place] LLE DVT: no evidence of DVT  Micro Data:  COVID neg   Antimicrobials:   2/27 flagyl 2/27 cefepime> 2/27 linezolid>  Interim History / Subjective:  BP has improved after 4L fluid resusc  Discussed appearance of LLE with pts son at bedside, and pts daughter via phone (who recently  helped with dressing change). Erythema and warmth is new.  Edema has reportedly actually improved Decreases L foot sensation is not new.   Objective   Blood pressure (!) 106/57, pulse 96, temperature 98.5 F (36.9 C), temperature source Oral, resp. rate (!) 22, height $RemoveBe'6\' 1"'EsSWlluJe$  (1.854 m), weight 127 kg, SpO2 99 %.        Intake/Output Summary (Last 24 hours) at 08/09/2020 1554 Last data filed at 08/09/2020 1156 Gross per 24 hour  Intake --  Output 250 ml  Net -250 ml   Filed Weights   08/09/20 0909  Weight: 127 kg    Examination: General: Chronically and acutely ill appearing older adult M, supine in ED bed NAD  HENT: NCAT. Pink dry mucus membranes with dry tongue. Anicteric sclera  Lungs: Symmetrical chest expansion, even unlabored respirations Cardiovascular: rrr s1s2 cap refill brisk  Abdomen: Soft obese ndnt + bowel sounds Extremities: R BKA. LLE erythema, edema, warmth, as well as chronic dark brown discoloration, dry skin on feet and dry, cracked non-healing wound on L hallux.  Neuro: PERRL. Midline tongue protrusion. mildly dysarthric. 5/5 shrug bilaterally. 5/5 BUE strength. 3/5 LLE 4/5 RLE. No facial droop. Diminished sensation to LLE.  GU: clear, yellow urine   Resolved Hospital Problem list     Assessment & Plan:   Acute encephalopathy  Dysarthria (son feels this is slightly increased from baseline, but I do see in neuro note 2/6 that pt stated this has been ongoing since early 2000s -suspect this is metabolic in etiology given neuro  exam. In speaking with pts son, it also sounds like presentation was similar with recent infection and AKI -had stroke workup in earlier Feb admission -- possible subacute R frontal infarct, severe stenosis or R vertebral artery -- put on ASA Plavix  P -correct metabolic abnormalities as able -follow neuro exam closely -- low threshold to repeat neuro imaging  -takes home asa plavix  Sepsis due to left lower extremity  cellulitis -non-healing L hallux wound, recently admitted and tx with course of abx for possible osteo and skin/soft tissue infection (amox/clav and doxy for 7 days per ID rec) -LLE without evidence of DVT 2/27 P -started on linezolid (vanc allergy) and cefepime in ED -was scheduled to see ID 3/2  -- would recommend ID consult early this admission   Hypovolemia -borderline hypotension in setting of poor PO intake as well as sepsis -got 4L NS in ED P -Adding LR infusion. Clinically still looks dry (especially mucus membranes)   AKI -suspect due to hypovolemia -making good urine, CK was 362 (was on ground for about an hour -- r/o rhabdo)  P -trend renal indices, UOP -mIVF as above  Myalgia  Weakness -suspect this is likely in setting of Sepsis, hypovolemia/dehydration -is on a statin at home, but doesn't look like this is new -would definitely rec PT   Thank you for consulting PCCM. At this time the patient does not require ICU care. Recommend TRH admission to SDU. PCCM will sign off. Please re-engage if the patient's clinical status changes or if we can be of further assistance.    Best practice (evaluated daily)  Diet: per primary Pain/Anxiety/Delirium protocol (if indicated): na VAP protocol (if indicated): na DVT prophylaxis: per primary GI prophylaxis: per primary Glucose control:per primary Mobility: PT Disposition: Recommend SDU  Goals of Care:  Last date of multidisciplinary goals of care discussion:-- Family and staff present: -- Summary of discussion: -- Follow up goals of care discussion due: -- Code Status: Full   Labs   CBC: Recent Labs  Lab 08/09/20 0858 08/09/20 0954  WBC 9.9  --   NEUTROABS 8.2*  --   HGB 10.0* 10.5*  HCT 31.9* 31.0*  MCV 98.2  --   PLT 219  --     Basic Metabolic Panel: Recent Labs  Lab 08/09/20 0858 08/09/20 0954  NA 140 143  K 3.9 3.9  CL 108 108  CO2 17*  --   GLUCOSE 175* 171*  BUN 104* 102*  CREATININE 2.85*  2.80*  CALCIUM 9.6  --   MG 2.1  --    GFR: Estimated Creatinine Clearance: 28.4 mL/min (A) (by C-G formula based on SCr of 2.8 mg/dL (H)). Recent Labs  Lab 08/09/20 0858 08/09/20 1123  WBC 9.9  --   LATICACIDVEN 4.1* 2.8*    Liver Function Tests: Recent Labs  Lab 08/09/20 0858  AST 28  ALT 20  ALKPHOS 42  BILITOT 0.9  PROT 6.4*  ALBUMIN 3.1*   Recent Labs  Lab 08/09/20 0858  LIPASE 69*   No results for input(s): AMMONIA in the last 168 hours.  ABG    Component Value Date/Time   TCO2 19 (L) 08/09/2020 0954     Coagulation Profile: Recent Labs  Lab 08/09/20 0858  INR 1.2    Cardiac Enzymes: Recent Labs  Lab 08/09/20 0858  CKTOTAL 362    HbA1C: Hgb A1c MFr Bld  Date/Time Value Ref Range Status  07/19/2020 03:30 AM 6.1 (H) 4.8 - 5.6 % Final  Comment:    (NOTE) Pre diabetes:          5.7%-6.4%  Diabetes:              >6.4%  Glycemic control for   <7.0% adults with diabetes   05/20/2020 09:51 AM 6.2 (H) 4.8 - 5.6 % Final    Comment:             Prediabetes: 5.7 - 6.4          Diabetes: >6.4          Glycemic control for adults with diabetes: <7.0     CBG: No results for input(s): GLUCAP in the last 168 hours.  Review of Systems:   + weakness + myalgias + thirst + poor po intake + fatigue + changes to skin (references non-healing L foot wound) + LLE edema + decreased sensation to L foot   12pt review of system performed and otherwise negative   Past Medical History:  He,  has a past medical history of Acute metabolic encephalopathy (01/14/2548), Amputated below knee ([place]), Anemia, Aspiration pneumonia ([place]) (02/05/4157), Diastolic heart failure ([place]), Diverticulosis, DM (diabetes mellitus) ([place]), Gout, Hyperlipemia, OSA on CPAP, RBBB, Sepsis ([place]) (11/02/2019), and Systemic hypertension.   Surgical History:   Past Surgical History:  Procedure Laterality Date  . LEG AMPUTATION BELOW KNEE     right  . NM MYOCAR PERF WALL MOTION  09/18/2009    no ischemia     Social History:   reports that he quit smoking about 19 years ago. His smoking use included cigars. He has never used smokeless tobacco. He reports that he does not drink alcohol and does not use drugs.   Family History:  His family history includes Cancer in his sister; Diabetes in his mother; Heart attack in his father.   Allergies Allergies  Allergen Reactions  . Vancomycin Rash     Home Medications  Prior to Admission medications   Medication Sig Start Date End Date Taking? Authorizing Provider  amoxicillin-clavulanate (AUGMENTIN) 875-125 MG tablet Take 1 tablet by mouth 2 (two) times daily. Started on 07-23-19 for 6 days. Unknown per family if he finished course of therapy.   Yes [provider]  aspirin EC 81 MG tablet Take 81 mg by mouth daily. Swallow whole.   Yes [provider]  Biotin 5000 MCG TABS Take 5,000 mcg by mouth daily.   Yes [provider]  [name] Apply between toes as needed for maceration. 08/08/20  Yes [name], DPM  clopidogrel (PLAVIX) 75 MG tablet Take 1 tablet (75 mg total) by mouth daily. 07/23/20 08/22/20 Yes Donne Hazel, MD  colchicine 0.6 MG tablet Take 0.6 mg by mouth daily as needed (gout flare up).   Yes [provider]  Continuous Blood Gluc Sensor (FREESTYLE LIBRE 14 DAY SENSOR) MISC Use as directed to check blood sugars 05/13/20  Yes [name], [name]  doxycycline (VIBRAMYCIN) 100 MG capsule Take 100 mg by mouth 2 (two) times daily. For 6 days. Patient was supposed to start on 07-25-19 however patient picked up RX on 08-04-19. Unknown how many doses have been took.   Yes [provider]  ferrous sulfate 325 (65 FE) MG tablet Take 325 mg by mouth every Monday, Wednesday, and Friday.   Yes [provider]  fish oil-omega-3 fatty acids 1000 MG capsule Take 2 g by mouth daily.   Yes [provider]  furosemide (LASIX) 40  MG tablet Take  2 tabs in morning and 1 tab evening Patient taking differently: Take 40-80 mg by mouth 2 (two) times daily. 80 mg in the morning and 40 mg at night 01/23/20  Yes [name], [name]  gabapentin (NEURONTIN) 100 MG capsule Take 1 capsule by mouth 3 times a day Patient taking differently: Take 100-200 mg by mouth 2 (two) times daily. 200 mg in the morning and 100 mg at bedtime. Total dose of 300 mg 07/15/20  Yes [name], [name]  glucose blood (FREESTYLE PRECISION NEO TEST) test strip Use as instructed 07/30/19  Yes [name], [name]  hydrocortisone (ANUSOL-HC) 25 MG suppository Place 25 mg rectally 2 (two) times daily as needed for hemorrhoids or anal itching.   Yes [provider]  latanoprost (XALATAN) 0.005 % ophthalmic solution Place 1 drop into both eyes at bedtime.  10/12/18  Yes [provider]  Multiple Vitamin (MULTIVITAMIN WITH MINERALS) TABS tablet Take 1 tablet by mouth daily.   Yes [provider]  pantoprazole (PROTONIX) 40 MG tablet Take 1 tablet  by mouth daily. Patient taking differently: Take 40 mg by mouth at bedtime. 07/15/20  Yes [name], [name]  Polyvinyl Alcohol-Povidone (REFRESH OP) Place 1 drop into both eyes daily as needed (dry eyes).   Yes [provider]  PRESCRIPTION MEDICATION Inhale into the lungs See admin instructions. CPAP- At bedtime   Yes [provider]  Semaglutide, [name], (OZEMPIC, [name],) 2 MG/1.5ML SOPN Inject 1 mg into the skin once a week. Patient taking differently: Inject 1 mg into the skin every Wednesday. 09/24/19  Yes [name], [name]  rosuvastatin (CRESTOR) 10 MG tablet Take 1 tablet (10 mg total) by mouth daily. Patient taking differently: Take 10 mg by mouth every 7 (seven) days. Wednesday of each week 11/07/19   Bonnielee Haff, MD    Critical care time: [place] MSN, AGACNP-BC [place] [phone] If no answer, [phone] 08/09/2020, 3:54  PM   Pulmonary critical care attending:  This is an 85 year old gentleman past medical history of CVA diabetes, right BKA, recent soft tissue skin infection, history of nonhealing wounds, possible osteo of the left toe.  Recently admitted to the hospital the beginning of February.  Was found at home week had been on the ground for at least an hour per patient.  Had fatigue malaise poor p.o. intake for the past several days.  Not feeling like he needed to urinate.  Mouth dry.  Patient was found to be slightly hypotensive concern for sepsis in the emergency department patient was given fluid resuscitation and broad-spectrum antibiotics.  Found to have a more warm red swollen left leg in comparison to previous.  Right leg status post below the knee amputation.  Lactate was 4 after resuscitation dropped to 2.  Critical care was consulted for hypotension and concern for ICU admission needs.  BP (!) 100/46   Pulse 95   Temp 98.5 F (36.9 C) (Oral)   Resp 15   Ht $R'6\' 1"'gi$  (1.854 m)   Wt 127 kg   SpO2 99%   BMI 36.94 kg/m   General: Elderly obese male resting in bed, some slurred/confused speech which per son is new but has happened in the past. HEENT: Tracking appropriately, mouth very dry Heart: Tachycardic regular S1-S2 Lungs: Clear to auscultation bilaterally Abdomen: Obese soft nontender to palpation Extremities: No significant edema in the left leg redness and warmth also has chronic  venous stasis changes.  Right leg status post BKA Neuro mild dysarthria weakness in the upper extremities.  Labs: Lactic acid improving  Assessment: Sepsis secondary to left lower extremity cellulitis, skin changes consistent with this. 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 I suspect some of these neuro components are related to metabolic acidosis. Would add additional maintenance fluids with lactated Ringer's at this time. BP is better status post fluid resuscitation Would consider admission to [facility] with stepdown unit. If patient's hypotension returns or gets worse would consider institution of IV norepinephrine to maintain mean arterial pressure greater than 65 mmhg  Consider lower extremity Doppler.  This patient is critically ill with multiple organ system failure; which, requires frequent high complexity decision making, assessment, support, evaluation, and titration of therapies. This was completed through the application of advanced monitoring technologies and extensive interpretation of multiple databases. During this encounter critical care time was devoted to patient care services described in this note for 32 minutes.  [facility] 08/09/2020 4:55 PM

## 2020-08-09 NOTE — Progress Notes (Signed)
Consult received. Discussed with Dr. Roosevelt Locks. Recommended MRI. Orders were placed. Podiatry will see the patient tomorrow and Dr. Roosevelt Locks is OK with that.

## 2020-08-09 NOTE — ED Provider Notes (Signed)
[facility] EMERGENCY DEPARTMENT Provider Note   CSN: [id number] Arrival date & time: 08/09/20  8589     History Chief Complaint  Patient presents with  . Generalized Body Aches    Micheal Walls is a 85 y.o. male.  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  History is limited by mental status. Is sleepy, slow to answer questions. Woke up this AM feeling unwell, generalized weakness, generalized aching. Reports aching throughout body including arms, legs, chest and abdomen.  Denies dyspnea, cough, fever, rash or skin issues other than wound on left toe.  Reports chronic difficulty urinating which is unchanged. Denies diarrhea, vomiting. No medication changes.    Past Medical History:  Diagnosis Date  . Acute metabolic encephalopathy 01/16/2020  . Amputated below knee (HCC)    right  . Anemia   . Aspiration pneumonia (HCC) 11/02/2019  . Diastolic heart failure (HCC)   . Diverticulosis   . DM (diabetes mellitus) (HCC)   . Gout   . Hyperlipemia   . OSA on CPAP   . RBBB   . Sepsis (HCC) 11/02/2019  . Systemic hypertension     Patient Active Problem List   Diagnosis Date Noted  . Diabetic foot infection (HCC)   . Cerebral thrombosis with cerebral infarction 07/20/2020  . Sepsis (HCC) 07/19/2020  . Thrombocytopenia (HCC) 07/19/2020  . Cerebral ischemia 07/18/2020  . Bilateral hearing loss 05/20/2020  . Does use hearing aid 05/20/2020  . Diabetic ulcer of left great toe (HCC) 01/16/2020  . Controlled type 2 diabetes mellitus with stable proliferative retinopathy of both eyes, with long-term current use of insulin (HCC) 01/06/2020  . Intermediate stage nonexudative age-related  macular degeneration of both eyes 01/06/2020  . Left epiretinal membrane 01/06/2020  . Drusen of right macula 01/06/2020  . AKI (acute kidney injury) (HCC) 11/02/2019  . Cellulitis 11/02/2019  . Elevated TSH 07/10/2018  . Nephropathy 02/28/2018  . Disturbance of skin sensation 02/28/2018  . CKD (chronic kidney disease) stage 4, GFR 15-29 ml/min (HCC) 07/17/2017  . PAD (peripheral artery disease) (HCC) 07/17/2017  . 110.1 10/21/2013  . Epiphora 04/24/2013  . Laxity of eyelid 04/24/2013  . Anemia 04/09/2013  . History of peripheral vascular disease 04/09/2013  . History of stroke 04/09/2013  . S/P unilateral BKA (below knee amputation) (HCC) 03/17/2013  . Diabetes mellitus type 2 in obese (HCC) 03/17/2013  . Mixed hyperlipidemia 03/17/2013  . Essential hypertension 03/17/2013  . Gout 03/17/2013  . CKD (chronic kidney disease), stage III (HCC) 03/17/2013  . OSA on CPAP 03/17/2013  . Anemia, iron deficiency 03/17/2013  . Diverticulosis of colon 03/17/2013  . RBBB 03/17/2013  . Punctal stenosis, acquired 10/02/2012    Past Surgical History:  Procedure Laterality Date  . LEG AMPUTATION BELOW KNEE     right  . NM MYOCAR PERF WALL MOTION  09/18/2009   no ischemia       Family History  Problem Relation Age of Onset  . Diabetes Mother   . Heart attack Father   . Cancer Sister     Social History   Tobacco Use  . Smoking status: Former Smoker    Types: Cigars    Quit date: 06/12/2001  Years since quitting: 19.1  . Smokeless tobacco: Never Used  Vaping Use  . Vaping Use: Former  Substance Use Topics  . Alcohol use: No    Alcohol/week: 0.0 standard drinks  . Drug use: No    Home Medications Prior to Admission medications   Medication Sig Start Date End Date Taking? Authorizing Provider  amoxicillin-clavulanate (AUGMENTIN) 875-125 MG tablet Take 1 tablet by mouth 2 (two) times daily. Started on 07-23-19 for 6 days. Unknown per family if he finished course of therapy.    Yes [provider]  aspirin EC 81 MG tablet Take 81 mg by mouth daily. Swallow whole.   Yes [provider]  Biotin 5000 MCG TABS Take 5,000 mcg by mouth daily.   Yes [provider]  [name] Apply between toes as needed for maceration. 08/08/20  Yes [name], DPM  clopidogrel (PLAVIX) 75 MG tablet Take 1 tablet (75 mg total) by mouth daily. 07/23/20 08/22/20 Yes Donne Hazel, MD  colchicine 0.6 MG tablet Take 0.6 mg by mouth daily as needed (gout flare up).   Yes [provider]  Continuous Blood Gluc Sensor (FREESTYLE LIBRE 14 DAY SENSOR) MISC Use as directed to check blood sugars 05/13/20  Yes [name], [name]  doxycycline (VIBRAMYCIN) 100 MG capsule Take 100 mg by mouth 2 (two) times daily. For 6 days. Patient was supposed to start on 07-25-19 however patient picked up RX on 08-04-19. Unknown how many doses have been took.   Yes [provider]  ferrous sulfate 325 (65 FE) MG tablet Take 325 mg by mouth every Monday, Wednesday, and Friday.   Yes [provider]  fish oil-omega-3 fatty acids 1000 MG capsule Take 2 g by mouth daily.   Yes [provider]  furosemide (LASIX) 40 MG tablet Take 2 tabs in morning and 1 tab evening Patient taking differently: Take 40-80 mg by mouth 2 (two) times daily. 80 mg in the morning and 40 mg at night 01/23/20  Yes [name], [name]  gabapentin (NEURONTIN) 100 MG capsule Take 1 capsule by mouth 3 times a day Patient taking differently: Take 100-200 mg by mouth 2 (two) times daily. 200 mg in the morning and 100 mg at bedtime. Total dose of 300 mg 07/15/20  Yes [name], [name]  glucose blood (FREESTYLE PRECISION NEO TEST) test strip Use as instructed 07/30/19  Yes [name], [name]  hydrocortisone (ANUSOL-HC) 25 MG suppository Place 25 mg rectally 2 (two) times daily as needed for hemorrhoids or anal itching.   Yes [provider]  latanoprost (XALATAN)  0.005 % ophthalmic solution Place 1 drop into both eyes at bedtime.  10/12/18  Yes [provider]  Multiple Vitamin (MULTIVITAMIN WITH MINERALS) TABS tablet Take 1 tablet by mouth daily.   Yes [provider]  pantoprazole (PROTONIX) 40 MG tablet Take 1 tablet  by mouth daily. Patient taking differently: Take 40 mg by mouth at bedtime. 07/15/20  Yes [name], [name]  Polyvinyl Alcohol-Povidone (REFRESH OP) Place 1 drop into both eyes daily as needed (dry eyes).   Yes [provider]  PRESCRIPTION MEDICATION Inhale into the lungs See admin instructions. CPAP- At bedtime   Yes [provider]  Semaglutide, [name], (OZEMPIC, [name],) 2 MG/1.5ML SOPN Inject 1 mg into the skin once a week. Patient taking differently: Inject 1 mg into the skin every Wednesday. 09/24/19  Yes [name], [name]  rosuvastatin (CRESTOR) 10  MG tablet Take 1 tablet (10 mg total) by mouth daily. Patient taking differently: Take 10 mg by mouth every 7 (seven) days. Wednesday of each week 11/07/19   Bonnielee Haff, MD    Allergies    Vancomycin  Review of Systems   Review of Systems  Unable to perform ROS: Mental status change  Constitutional: Positive for activity change and fatigue. Negative for fever.  Respiratory: Negative for cough and shortness of breath.   Cardiovascular: Positive for chest pain.  Gastrointestinal: Positive for abdominal distention. Negative for diarrhea, nausea and vomiting.  Genitourinary: Positive for difficulty urinating.  Musculoskeletal: Positive for arthralgias and myalgias.  Skin: Positive for wound. Negative for rash.  Neurological: Negative for headaches.    Physical Exam Updated Vital Signs BP (!) 106/57   Pulse 96   Temp 98.5 F (36.9 C) (Oral)   Resp (!) 22   Ht $R'6\' 1"'GF$  (1.854 m)   Wt 127 kg   SpO2 99%   BMI 36.94 kg/m   Physical Exam Vitals and nursing note reviewed.  Constitutional:      General: He is sleeping. He is not in  acute distress.    Appearance: He is well-developed and well-nourished. He is ill-appearing and toxic-appearing. He is not diaphoretic.  HENT:     Head: Normocephalic and atraumatic.  Eyes:     Extraocular Movements: EOM normal.     Conjunctiva/sclera: Conjunctivae normal.  Cardiovascular:     Rate and Rhythm: Normal rate and regular rhythm.     Pulses: Intact distal pulses.     Heart sounds: Normal heart sounds. No murmur heard. No friction rub. No gallop.   Pulmonary:     Effort: Pulmonary effort is normal. No respiratory distress.     Breath sounds: Normal breath sounds. No wheezing or rales.  Abdominal:     General: There is no distension.     Palpations: Abdomen is soft.     Tenderness: There is no abdominal tenderness. There is no guarding.  Musculoskeletal:     Cervical back: Normal range of motion.     Left lower leg: Edema present.     Comments: Wound left great toe, no significant surrounding erythema BKA right   Skin:    General: Skin is warm and dry.  Neurological:     Mental Status: He is oriented to person, place, and time.     ED Results / Procedures / Treatments   Labs (all labs ordered are listed, but only abnormal results are displayed) Labs Reviewed  LACTIC ACID, PLASMA - Abnormal; Notable for the following components:      Result Value   Lactic Acid, Venous 4.1 (*)    All other components within normal limits  LACTIC ACID, PLASMA - Abnormal; Notable for the following components:   Lactic Acid, Venous 2.8 (*)    All other components within normal limits  COMPREHENSIVE METABOLIC PANEL - Abnormal; Notable for the following components:   CO2 17 (*)    Glucose, Bld 175 (*)    BUN 104 (*)    Creatinine, Ser 2.85 (*)    Total Protein 6.4 (*)    Albumin 3.1 (*)    GFR, Estimated 21 (*)    All other components within normal limits  CBC WITH DIFFERENTIAL/PLATELET - Abnormal; Notable for the following components:   RBC 3.25 (*)    Hemoglobin 10.0 (*)     HCT 31.9 (*)    RDW 16.8 (*)    Neutro Abs  8.2 (*)    Lymphs Abs 0.6 (*)    Abs Immature Granulocytes 0.13 (*)    All other components within normal limits  URINALYSIS, ROUTINE W REFLEX MICROSCOPIC - Abnormal; Notable for the following components:   APPearance HAZY (*)    Protein, ur 30 (*)    All other components within normal limits  LIPASE, BLOOD - Abnormal; Notable for the following components:   Lipase 69 (*)    All other components within normal limits  I-STAT CHEM 8, ED - Abnormal; Notable for the following components:   BUN 102 (*)    Creatinine, Ser 2.80 (*)    Glucose, Bld 171 (*)    TCO2 19 (*)    Hemoglobin 10.5 (*)    HCT 31.0 (*)    All other components within normal limits  TROPONIN I (HIGH SENSITIVITY) - Abnormal; Notable for the following components:   Troponin I (High Sensitivity) 112 (*)    All other components within normal limits  TROPONIN I (HIGH SENSITIVITY) - Abnormal; Notable for the following components:   Troponin I (High Sensitivity) 114 (*)    All other components within normal limits  RESP PANEL BY RT-PCR (FLU A&B, COVID) ARPGX2  CULTURE, BLOOD (ROUTINE X 2)  CULTURE, BLOOD (ROUTINE X 2)  URINE CULTURE  PROTIME-INR  APTT  MAGNESIUM  CK    EKG EKG Interpretation  Date/Time:  Sunday August 09 2020 09:19:46 EST Ventricular Rate:  109 PR Interval:    QRS Duration: 157 QT Interval:  362 QTC Calculation: 488 R Axis:   21 Text Interpretation: Sinus or ectopic atrial tachycardia Right bundle branch block No significant change since last tracing Confirmed by Gareth Morgan [phone]) on 08/09/2020 9:29:12 AM Also confirmed by Gareth Morgan [phone]), editor Hattie Perch [phone])  on 08/09/2020 2:34:53 PM   Radiology DG Chest Port 1 View  Result Date: 08/09/2020 CLINICAL DATA:  Sepsis EXAM: PORTABLE CHEST 1 VIEW COMPARISON:  July 18, 2020 FINDINGS: The cardiomediastinal silhouette is unchanged in contour when accounting for patient  rotation.Atherosclerotic calcifications of the RIGHT carotid. No pleural effusion. No pneumothorax. No acute pleuroparenchymal abnormality. Visualized abdomen is unremarkable. Multilevel degenerative changes of the thoracic spine. IMPRESSION: No acute cardiopulmonary abnormality. Electronically Signed   By: Valentino Saxon MD   On: 08/09/2020 09:39   VAS [place] LOWER EXTREMITY VENOUS (DVT) (ONLY [facility] & [facility])  Result Date: 08/09/2020  Lower Venous DVT Study Indications: Edema.  Comparison Study: Prior negative Left lower extremity venous duplex done                   01/31/20 Performing Technologist: Sharion Dove RVS  Examination Guidelines: A complete evaluation includes B-mode imaging, spectral Doppler, color Doppler, and power Doppler as needed of all accessible portions of each vessel. Bilateral testing is considered an integral part of a complete examination. Limited examinations for reoccurring indications may be performed as noted. The reflux portion of the exam is performed with the patient in reverse Trendelenburg.  +-----+---------------+---------+-----------+----------+--------------+ RIGHTCompressibilityPhasicitySpontaneityPropertiesThrombus Aging +-----+---------------+---------+-----------+----------+--------------+ CFV  Full           Yes      Yes                                 +-----+---------------+---------+-----------+----------+--------------+   +---------+---------------+---------+-----------+----------+--------------+ LEFT     CompressibilityPhasicitySpontaneityPropertiesThrombus Aging +---------+---------------+---------+-----------+----------+--------------+ CFV      Full  Yes      Yes                                 +---------+---------------+---------+-----------+----------+--------------+ SFJ      Full                                                        +---------+---------------+---------+-----------+----------+--------------+ FV Prox  Full                                                         +---------+---------------+---------+-----------+----------+--------------+ FV Mid   Full                                                        +---------+---------------+---------+-----------+----------+--------------+ FV DistalFull                                                        +---------+---------------+---------+-----------+----------+--------------+ PFV      Full                                                        +---------+---------------+---------+-----------+----------+--------------+ POP      Full           Yes      Yes                                 +---------+---------------+---------+-----------+----------+--------------+ PTV      Full                                                        +---------+---------------+---------+-----------+----------+--------------+ PERO     Full                                                        +---------+---------------+---------+-----------+----------+--------------+     Summary: RIGHT: - No evidence of common femoral vein obstruction.  LEFT: - There is no evidence of deep vein thrombosis in the lower extremity.  - Ultrasound characteristics of enlarged lymph nodes noted in the groin. interstitial fluid noted throughout calf.  *See table(s) above for measurements and observations.    Preliminary     Procedures .Critical Care Performed by: Gareth Morgan, MD Authorized by:  Gareth Morgan, MD   Critical care provider statement:    Critical care time (minutes):  60   Critical care was time spent personally by me on the following activities:  Discussions with consultants, evaluation of patient's response to treatment, examination of patient, ordering and performing treatments and interventions, ordering and review of laboratory studies, ordering and review of radiographic studies, pulse oximetry, re-evaluation of patient's condition,  obtaining history from patient or surrogate and review of old charts     Medications Ordered in ED Medications  linezolid (ZYVOX) IVPB 600 mg (0 mg Intravenous Stopped 08/09/20 1222)  ceFEPIme (MAXIPIME) 2 g in sodium chloride 0.9 % 100 mL IVPB (has no administration in time range)  lactated ringers infusion (has no administration in time range)  sodium chloride 0.9 % bolus 1,000 mL (0 mLs Intravenous Stopped 08/09/20 1108)  ceFEPIme (MAXIPIME) 2 g in sodium chloride 0.9 % 100 mL IVPB (0 g Intravenous Stopped 08/09/20 1038)  metroNIDAZOLE (FLAGYL) IVPB 500 mg (0 mg Intravenous Stopped 08/09/20 1138)  sodium chloride 0.9 % bolus 1,000 mL (0 mLs Intravenous Stopped 08/09/20 1231)  sodium chloride 0.9 % bolus 1,000 mL (0 mLs Intravenous Stopped 08/09/20 1339)  sodium chloride 0.9 % bolus 1,000 mL (0 mLs Intravenous Stopped 08/09/20 1416)    ED Course  I have reviewed the triage vital signs and the nursing notes.  Pertinent labs & imaging results that were available during my care of the patient were reviewed by me and considered in my medical decision making (see chart for details).    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  Presents with encephalopathy, sleepiness, BP 115/55, tachycardic.  Concern for possible sepsis and in setting of recent sepsis/wound infection/osteomyelitis ordered broad coverage with abx linezolid (vanc allergy), cefepime and flagyl.  DDx also includes PE, ACS, electrolyte abnormalities/worsening renal failure, COVID 19.   EKG unchanged from prior. Labwork shows repeat AKI with Cr 2.8, stable anemia.  Troponin elevated to  112 (no previous)-repeat stable, doubt ACS.  UA, CXR without signs of infection or edema.  Possible sepsis related to bacteremia/osteomyelitis however wound does not appear cellulitic or draining at this time.  Has left shift but no leukocytosis.   Lactic acid returned greater than 4. Initially given 1L given unclear if sepsis however subsequently ordered full 30cc/kg.  (4L) Lactic acid decreased to 2.8.  Renal function not appropriate for CTA for PE evaluation. Will evaluate with DVT US given LE edema, negative for DVT.  Denies medication changes. Denies focal neurologic changes, headache or new trauma.  Will admit to hospitalist for further care.  While awaiting call, his blood pressures continued to decrease to 42A systolic.  His 4 L of fluid is running and BP still in 90s, and given worsening blood pressure after significant fluid resuscitation consulted PCCM for admission and to likely begin pressors if blood pressures continue to decrease.      Final Clinical Impression(s) / ED Diagnoses Final diagnoses:  Generalized weakness  Body aches  Tachycardia  Hypotension, unspecified hypotension type    Rx / DC Orders ED  Discharge Orders    None       Gareth Morgan, MD 08/09/20 1600

## 2020-08-09 NOTE — ED Triage Notes (Signed)
Patient presents to ed via [facility] from home c/o generalized bodyaches  Onset this am. States  He is currently followed by wound center for wound on left great toe. Swelling to left lower ext. Patient has a right BKA. Alert oriented.

## 2020-08-09 NOTE — H&P (Signed)
History and Physical    Micheal Walls FMB:[id number] DOB: 07-11-1935 DOA: 08/09/2020  PCP: [name], [name] (Confirm with patient/family/NH records and if not entered, this has to be entered at [facility] point of entry) Patient coming from: Home  I have personally briefly reviewed patient's old medical records in [place]  Chief Complaint: Not feeling well.  HPI: Micheal Walls is a 85 y.o. male with medical history significant of HTN, CKD stage III, chronic diastolic CHF, IIDM, recent stroke, recent left great toe osteomyelitis on long-term p.o. antibiotics Augmentin and doxycycline, diabetic neuropathy, presented with increasing left leg pain and altered mentations.  Patient lethargic, and confused, most history provided by patient brother at bedside.  Patient was recently diagnosed with left big toe osteomyelitis and has been taking p.o. antibiotics including doxycycline and Augmentin for the infection since 07/22/2020.  Patient also has been following with wound care.  According to patient's brother, patient has been stable, but continues to have left leg swelling, but yesterday, patient a new rash on the left shin area and complaining about severe pain.  Family did not noticed any fever.  Patient denied any urinary symptoms or diarrhea. ED Course: Blood pressure low, patient was sleepy on arrival, blood work showed lactic acid 4.1, patient received a total of 4 L of IV boluses and blood pressure improved and tachycardia improved, patient was evaluated by ICU attending and recommend hospitalist admission.  Foley was placed.  Review of Systems: Unable to perform, patient very sleepy.  Past Medical History:  Diagnosis Date  . Acute metabolic encephalopathy 7/0/1779  . 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 Anemia   . Aspiration pneumonia ([place]) 11/02/2019  . Diastolic heart failure ([place])   . Diverticulosis   . DM (diabetes mellitus) ([place])   . Gout   . Hyperlipemia   . OSA on  CPAP   . RBBB   . Sepsis ([place]) 11/02/2019  . Systemic hypertension     Past Surgical History:  Procedure Laterality Date  . LEG AMPUTATION BELOW KNEE     right  . NM MYOCAR PERF WALL MOTION  09/18/2009   no ischemia     reports that he quit smoking about 19 years ago. His smoking use included cigars. He has never used smokeless tobacco. He reports that he does not drink alcohol and does not use drugs.  Allergies  Allergen Reactions  . Vancomycin Rash    Family History  Problem Relation Age of Onset  . Diabetes Mother   . Heart attack Father   . Cancer Sister      Prior to Admission medications   Medication Sig Start Date End Date Taking? Authorizing Provider  amoxicillin-clavulanate (AUGMENTIN) 875-125 MG tablet Take 1 tablet by mouth 2 (two) times daily. Started on 07-23-19 for 6 days. Unknown per family if he finished course of therapy.   Yes [provider]  aspirin EC 81 MG tablet Take 81 mg by mouth daily. Swallow whole.   Yes [provider]  Biotin 5000 MCG TABS Take 5,000 mcg by mouth daily.   Yes [provider]  [name] Apply between toes as needed for maceration. 08/08/20  Yes [name], DPM  clopidogrel (PLAVIX) 75 MG tablet Take 1 tablet (75 mg total) by mouth daily. 07/23/20 08/22/20 Yes Donne Hazel, MD  colchicine 0.6 MG tablet Take 0.6 mg by mouth daily as needed (gout flare up).  Yes [provider]  Continuous Blood Gluc Sensor (FREESTYLE LIBRE 14 DAY SENSOR) MISC Use as directed to check blood sugars 05/13/20  Yes [name], [name]  doxycycline (VIBRAMYCIN) 100 MG capsule Take 100 mg by mouth 2 (two) times daily. For 6 days. Patient was supposed to start on 07-25-19 however patient picked up RX on 08-04-19. Unknown how many doses have been took.   Yes [provider]  ferrous sulfate 325 (65 FE) MG tablet Take 325 mg by mouth every Monday, Wednesday, and Friday.   Yes [provider]  fish oil-omega-3 fatty acids 1000 MG capsule Take 2 g by mouth daily.   Yes [provider]  furosemide (LASIX) 40 MG tablet Take 2 tabs in morning and 1 tab evening Patient taking differently: Take 40-80 mg by mouth 2 (two) times daily. 80 mg in the morning and 40 mg at night 01/23/20  Yes [name], [name]  gabapentin (NEURONTIN) 100 MG capsule Take 1 capsule by mouth 3 times a day Patient taking differently: Take 100-200 mg by mouth 2 (two) times daily. 200 mg in the morning and 100 mg at bedtime. Total dose of 300 mg 07/15/20  Yes [name], [name]  glucose blood (FREESTYLE PRECISION NEO TEST) test strip Use as instructed 07/30/19  Yes [name], [name]  hydrocortisone (ANUSOL-HC) 25 MG suppository Place 25 mg rectally 2 (two) times daily as needed for hemorrhoids or anal itching.   Yes [provider]  latanoprost (XALATAN) 0.005 % ophthalmic solution Place 1 drop into both eyes at bedtime.  10/12/18  Yes [provider]  Multiple Vitamin (MULTIVITAMIN WITH MINERALS) TABS tablet Take 1 tablet by mouth daily.   Yes [provider]  pantoprazole (PROTONIX) 40 MG tablet Take 1 tablet  by mouth daily. Patient taking differently: Take 40 mg by mouth at bedtime. 07/15/20  Yes [name], [name]  Polyvinyl Alcohol-Povidone (REFRESH OP) Place 1 drop into both eyes daily as needed (dry eyes).   Yes [provider]  PRESCRIPTION MEDICATION Inhale into the lungs See admin instructions. CPAP- At bedtime   Yes [provider]  Semaglutide, [name], (OZEMPIC, [name],) 2 MG/1.5ML SOPN Inject 1 mg into the skin once a week. Patient taking differently: Inject 1 mg into the skin every Wednesday. 09/24/19  Yes [name], [name]  rosuvastatin (CRESTOR) 10 MG tablet Take 1 tablet (10 mg total) by mouth daily. Patient taking differently: Take 10 mg by mouth every 7 (seven) days. Wednesday of each week 11/07/19   Bonnielee Haff, MD    Physical  Exam: Vitals:   08/09/20 1500 08/09/20 1530 08/09/20 1545 08/09/20 1600  BP: (!) 98/45 (!) 106/57 (!) 103/54 (!) 100/46  Pulse: 98 96 95 95  Resp: 13 (!) $Remo'22 15 15  'IQlgA$ Temp:      TempSrc:      SpO2: 97% 99% 99% 99%  Weight:      Height:        Constitutional: NAD, calm, comfortable Vitals:   08/09/20 1500 08/09/20 1530 08/09/20 1545 08/09/20 1600  BP: (!) 98/45 (!) 106/57 (!) 103/54 (!) 100/46  Pulse: 98 96 95 95  Resp: 13 (!) $Remo'22 15 15  'cRLgN$ Temp:      TempSrc:      SpO2: 97% 99% 99% 99%  Weight:      Height:       Eyes: PERRL, lids and conjunctivae normal ENMT: Mucous membranes are dry. Posterior pharynx clear of any exudate or  lesions.Normal dentition.  Neck: normal, supple, no masses, no thyromegaly Respiratory: clear to auscultation bilaterally, no wheezing, no crackles. Normal respiratory effort. No accessory muscle use.  Cardiovascular: Regular rate and rhythm, no murmurs / rubs / gallops.  2+ extremity edema. 2+ pedal pulses. No carotid bruits.  Abdomen: no tenderness, no masses palpated. No hepatosplenomegaly. Bowel sounds positive.  Musculoskeletal: no clubbing / cyanosis. No joint deformity upper and lower extremities. Good ROM, no contractures. Normal muscle tone.  Skin: 2+ pitting edema on left ankle and shin area, large area of cellulitis-like changes on left shin with rash and tenderness, left big toe ulcer appeared to be clean.  Chronic right-sided BKA. Neurologic: No facial droops, moving all limbs, following simple commands Psychiatric: Awake, appears to be very sleepy   Labs on Admission: I have personally reviewed following labs and imaging studies  CBC: Recent Labs  Lab 08/09/20 0858 08/09/20 0954  WBC 9.9  --   NEUTROABS 8.2*  --   HGB 10.0* 10.5*  HCT 31.9* 31.0*  MCV 98.2  --   PLT 219  --    Basic Metabolic Panel: Recent Labs  Lab 08/09/20 0858 08/09/20 0954  NA 140 143  K 3.9 3.9  CL 108 108  CO2 17*  --   GLUCOSE 175* 171*  BUN 104* 102*   CREATININE 2.85* 2.80*  CALCIUM 9.6  --   MG 2.1  --    GFR: Estimated Creatinine Clearance: 28.4 mL/min (A) (by C-G formula based on SCr of 2.8 mg/dL (H)). Liver Function Tests: Recent Labs  Lab 08/09/20 0858  AST 28  ALT 20  ALKPHOS 42  BILITOT 0.9  PROT 6.4*  ALBUMIN 3.1*   Recent Labs  Lab 08/09/20 0858  LIPASE 69*   No results for input(s): AMMONIA in the last 168 hours. Coagulation Profile: Recent Labs  Lab 08/09/20 0858  INR 1.2   Cardiac Enzymes: Recent Labs  Lab 08/09/20 0858  CKTOTAL 362   BNP (last 3 results) No results for input(s): PROBNP in the last 8760 hours. HbA1C: No results for input(s): HGBA1C in the last 72 hours. CBG: No results for input(s): GLUCAP in the last 168 hours. Lipid Profile: No results for input(s): CHOL, HDL, LDLCALC, TRIG, CHOLHDL, LDLDIRECT in the last 72 hours. Thyroid Function Tests: No results for input(s): TSH, T4TOTAL, FREET4, T3FREE, THYROIDAB in the last 72 hours. Anemia Panel: No results for input(s): VITAMINB12, FOLATE, FERRITIN, TIBC, IRON, RETICCTPCT in the last 72 hours. Urine analysis:    Component Value Date/Time   COLORURINE YELLOW 08/09/2020 1159   APPEARANCEUR HAZY (A) 08/09/2020 1159   LABSPEC 1.012 08/09/2020 1159   PHURINE 5.0 08/09/2020 1159   GLUCOSEU NEGATIVE 08/09/2020 1159   HGBUR NEGATIVE 08/09/2020 1159   BILIRUBINUR NEGATIVE 08/09/2020 1159   BILIRUBINUR negative 05/15/2019 1029   KETONESUR NEGATIVE 08/09/2020 1159   PROTEINUR 30 (A) 08/09/2020 1159   UROBILINOGEN 0.2 05/15/2019 1029   UROBILINOGEN 0.2 01/08/2011 1558   NITRITE NEGATIVE 08/09/2020 1159   LEUKOCYTESUR NEGATIVE 08/09/2020 1159    Radiological Exams on Admission: DG Chest Port 1 View  Result Date: 08/09/2020 CLINICAL DATA:  Sepsis EXAM: PORTABLE CHEST 1 VIEW COMPARISON:  July 18, 2020 FINDINGS: The cardiomediastinal silhouette is unchanged in contour when accounting for patient rotation.Atherosclerotic  calcifications of the RIGHT carotid. No pleural effusion. No pneumothorax. No acute pleuroparenchymal abnormality. Visualized abdomen is unremarkable. Multilevel degenerative changes of the thoracic spine. IMPRESSION: No acute cardiopulmonary abnormality. Electronically Signed   By: Colletta Maryland  Peacock MD   On: 08/09/2020 09:39   VAS [place] LOWER EXTREMITY VENOUS (DVT) (ONLY [facility] & [facility])  Result Date: 08/09/2020  Lower Venous DVT Study Indications: Edema.  Comparison Study: Prior negative Left lower extremity venous duplex done                   01/31/20 Performing Technologist: Sharion Dove RVS  Examination Guidelines: A complete evaluation includes B-mode imaging, spectral Doppler, color Doppler, and power Doppler as needed of all accessible portions of each vessel. Bilateral testing is considered an integral part of a complete examination. Limited examinations for reoccurring indications may be performed as noted. The reflux portion of the exam is performed with the patient in reverse Trendelenburg.  +-----+---------------+---------+-----------+----------+--------------+ RIGHTCompressibilityPhasicitySpontaneityPropertiesThrombus Aging +-----+---------------+---------+-----------+----------+--------------+ CFV  Full           Yes      Yes                                 +-----+---------------+---------+-----------+----------+--------------+   +---------+---------------+---------+-----------+----------+--------------+ LEFT     CompressibilityPhasicitySpontaneityPropertiesThrombus Aging +---------+---------------+---------+-----------+----------+--------------+ CFV      Full           Yes      Yes                                 +---------+---------------+---------+-----------+----------+--------------+ SFJ      Full                                                        +---------+---------------+---------+-----------+----------+--------------+ FV Prox  Full                                                         +---------+---------------+---------+-----------+----------+--------------+ FV Mid   Full                                                        +---------+---------------+---------+-----------+----------+--------------+ FV DistalFull                                                        +---------+---------------+---------+-----------+----------+--------------+ PFV      Full                                                        +---------+---------------+---------+-----------+----------+--------------+ POP      Full           Yes      Yes                                 +---------+---------------+---------+-----------+----------+--------------+  PTV      Full                                                        +---------+---------------+---------+-----------+----------+--------------+ PERO     Full                                                        +---------+---------------+---------+-----------+----------+--------------+     Summary: RIGHT: - No evidence of common femoral vein obstruction.  LEFT: - There is no evidence of deep vein thrombosis in the lower extremity.  - Ultrasound characteristics of enlarged lymph nodes noted in the groin. interstitial fluid noted throughout calf.  *See table(s) above for measurements and observations.    Preliminary     EKG: Independently reviewed.  Chronic RBBB  Assessment/Plan Active Problems:   * No active hospital problems. *  (please populate well all problems here in Problem List. (For example, if patient is on BP meds at home and you resume or decide to hold them, it is a problem that needs to be her. Same for CAD, COPD, HLD and so on)  Sepsis severe, POA -Evidenced by hypotension, elevated lactate, endorgan damage of encephalopathy and AKI, source likely is left leg cellulitis. -Secondary to left leg cellulitis, appears to be a breakthrough infection given the patient already  on Augmentin and doxycycline, in this case suspect resistant organism, no recent culture to refer to, agreed with broaden coverage of Zyvox and Cefepime for now. -DVT study pending -Ordered left leg and foot x-ray -Left toe ulcer appears to be clean, will not repeat MRI for now, but if condition not improving, will consider repeat MRI imaging. -Blood culture x2.  AKI secondary to sepsis -Appears to be still be hypovolemia -Received total 4 L IV boluses, continue gentle hydration given patient's history of CHF. -Continue Foley today for I/O.  Acute metabolic encephalopathy -Likely secondary to sepsis -CT head given recent history of stroke  [place] metabolic acidosis -Likely secondary to AKI -Check VBG  Chronic diastolic CHF -Severe dehydration and AKI, hold Lasix and BP meds for today.  Diabetic neuropathy -Decrease gabapentin and start from tomorrow to avoid oversedation.  IIDM -Sliding scale.  Recent stroke -Continue aspirin Plavix and statin.  DVT prophylaxis: Heparin subcu Code Status: Full Code Family Communication: Brother at bedside, called wife x1 not picking up. Disposition Plan: Sick with severe sepsis, expect more than 2 midnight hospital stay. Consults called: ICU Admission status: PCU   Lequita Halt MD [facility] Pager [phone]  08/09/2020, 4:17 PM

## 2020-08-09 NOTE — Progress Notes (Signed)
D/W podiatry, recommend MRI and ABI studies, all ordered.

## 2020-08-09 NOTE — Progress Notes (Signed)
VASCULAR LAB    Bilateral lower extremity venous duplex has been performed.  See CV proc for preliminary results.   Messaged results to Dr. Billy Fischer via secure chat.  Micheal Walls, RVT 08/09/2020, 1:24 PM

## 2020-08-10 ENCOUNTER — Telehealth: Payer: Medicare Other

## 2020-08-10 ENCOUNTER — Inpatient Hospital Stay (HOSPITAL_COMMUNITY): Payer: Medicare Other

## 2020-08-10 ENCOUNTER — Ambulatory Visit: Payer: Self-pay

## 2020-08-10 DIAGNOSIS — M25472 Effusion, left ankle: Secondary | ICD-10-CM

## 2020-08-10 DIAGNOSIS — L03116 Cellulitis of left lower limb: Secondary | ICD-10-CM | POA: Diagnosis not present

## 2020-08-10 DIAGNOSIS — M869 Osteomyelitis, unspecified: Secondary | ICD-10-CM | POA: Diagnosis not present

## 2020-08-10 DIAGNOSIS — I129 Hypertensive chronic kidney disease with stage 1 through stage 4 chronic kidney disease, or unspecified chronic kidney disease: Secondary | ICD-10-CM

## 2020-08-10 DIAGNOSIS — I739 Peripheral vascular disease, unspecified: Secondary | ICD-10-CM

## 2020-08-10 DIAGNOSIS — N184 Chronic kidney disease, stage 4 (severe): Secondary | ICD-10-CM

## 2020-08-10 DIAGNOSIS — E1159 Type 2 diabetes mellitus with other circulatory complications: Secondary | ICD-10-CM

## 2020-08-10 DIAGNOSIS — M86172 Other acute osteomyelitis, left ankle and foot: Secondary | ICD-10-CM

## 2020-08-10 DIAGNOSIS — A419 Sepsis, unspecified organism: Secondary | ICD-10-CM | POA: Diagnosis not present

## 2020-08-10 DIAGNOSIS — Z794 Long term (current) use of insulin: Secondary | ICD-10-CM

## 2020-08-10 LAB — URINE CULTURE: Culture: NO GROWTH

## 2020-08-10 LAB — BASIC METABOLIC PANEL
Anion gap: 12 (ref 5–15)
BUN: 86 mg/dL — ABNORMAL HIGH (ref 8–23)
CO2: 19 mmol/L — ABNORMAL LOW (ref 22–32)
Calcium: 9 mg/dL (ref 8.9–10.3)
Chloride: 111 mmol/L (ref 98–111)
Creatinine, Ser: 2.03 mg/dL — ABNORMAL HIGH (ref 0.61–1.24)
GFR, Estimated: 32 mL/min — ABNORMAL LOW (ref >60)
Glucose, Bld: 109 mg/dL — ABNORMAL HIGH (ref 70–99)
Potassium: 3.8 mmol/L (ref 3.5–5.1)
Sodium: 142 mmol/L (ref 135–145)

## 2020-08-10 LAB — GLUCOSE, CAPILLARY
Glucose-Capillary: 100 mg/dL — ABNORMAL HIGH (ref 70–99)
Glucose-Capillary: 126 mg/dL — ABNORMAL HIGH (ref 70–99)
Glucose-Capillary: 143 mg/dL — ABNORMAL HIGH (ref 70–99)
Glucose-Capillary: 103 mg/dL — ABNORMAL HIGH (ref 70–99)

## 2020-08-10 LAB — CBC
HCT: 27.4 % — ABNORMAL LOW (ref 39.0–52.0)
Hemoglobin: 9.5 g/dL — ABNORMAL LOW (ref 13.0–17.0)
MCH: 32.5 pg (ref 26.0–34.0)
MCHC: 34.7 g/dL (ref 30.0–36.0)
MCV: 93.8 fL (ref 80.0–100.0)
Platelets: 205 10*3/uL (ref 150–400)
RBC: 2.92 MIL/uL — ABNORMAL LOW (ref 4.22–5.81)
RDW: 16.9 % — ABNORMAL HIGH (ref 11.5–15.5)
WBC: 12.1 10*3/uL — ABNORMAL HIGH (ref 4.0–10.5)
nRBC: 0 % (ref 0.0–0.2)

## 2020-08-10 LAB — C-REACTIVE PROTEIN: CRP: 26.4 mg/dL — ABNORMAL HIGH (ref <1.0)

## 2020-08-10 LAB — SEDIMENTATION RATE: Sed Rate: 55 mm/hr — ABNORMAL HIGH (ref 0–16)

## 2020-08-10 IMAGING — MR MR FOOT*L* W/O CM
4 of 6 series · 19 of 40 positions shown · non-contrast
Comparison: Radiographs [DATE] and MRI from [DATE]

CLINICAL DATA: Great toe osteomyelitis, long-term antibiotics.
Chronic kidney disease.

EXAM:
MRI OF THE LEFT FOOT WITHOUT CONTRAST
TECHNIQUE: Multiplanar, multisequence MR imaging of the left forefoot was
performed. No intravenous contrast was administered.

[Series 3: T1 · coronal · 3.0mm · 0.27mm/px · 4 of 49 slices shown (1 of 2)]
[im 1/49]
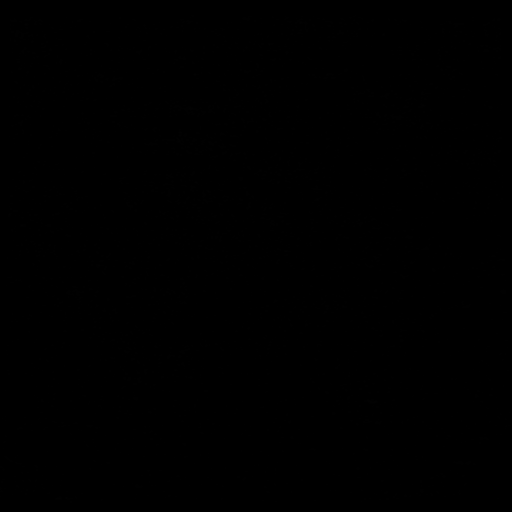
[im 7/49]
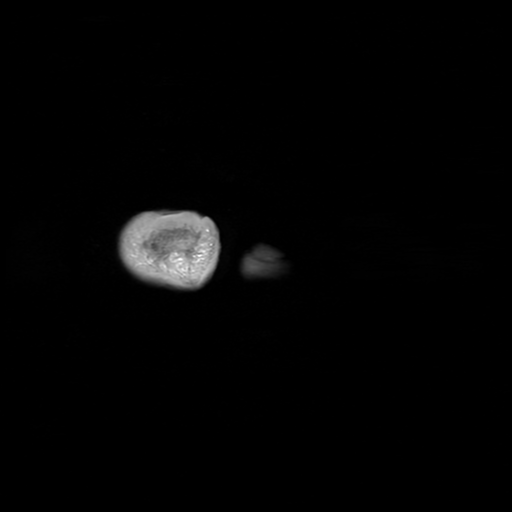
[im 28/49]
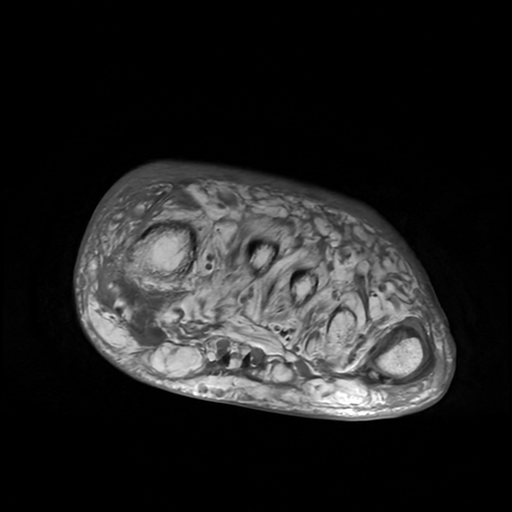
[im 42/49]
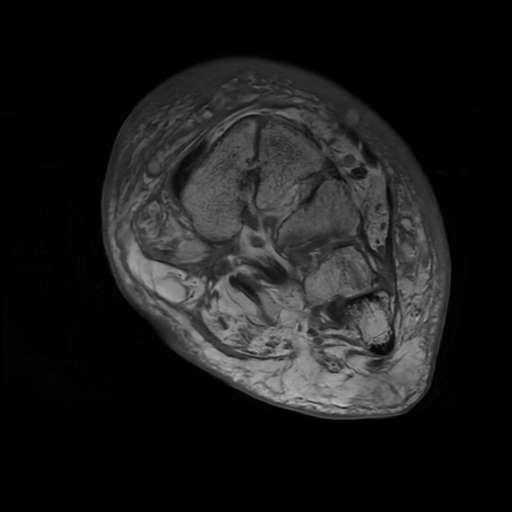

[Series 4: T1 fat-sat · coronal · non-contrast · 3.0mm · 0.27mm/px · 3 of 49 slices shown]
[im 7/49]
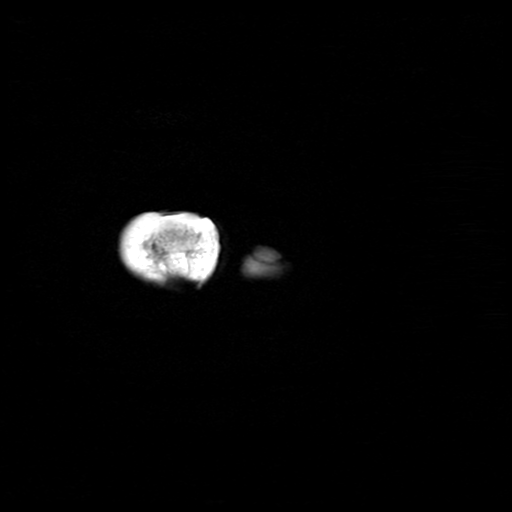
[im 25/49]
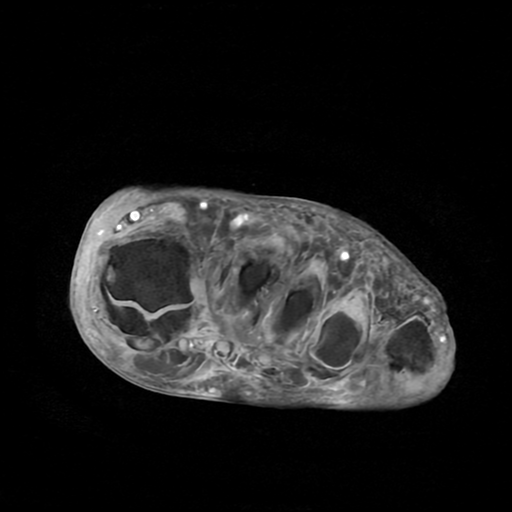
[im 43/49]
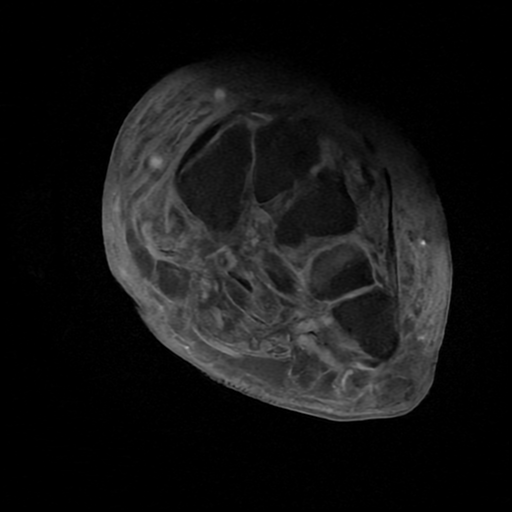

[Series 6: T2 fat-sat · coronal · 3.0mm · 0.27mm/px · 9 of 49 slices shown]
[im 1/49]
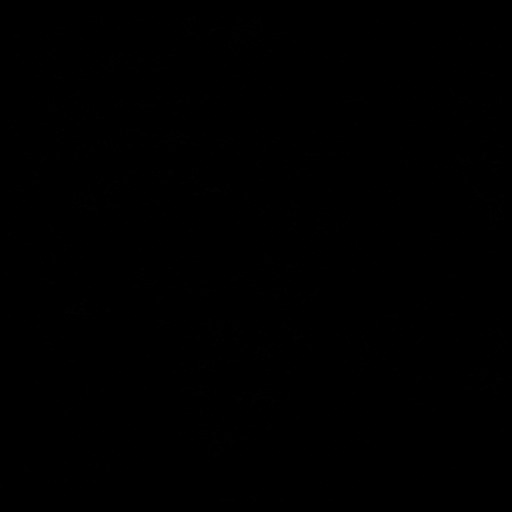
[im 7/49]
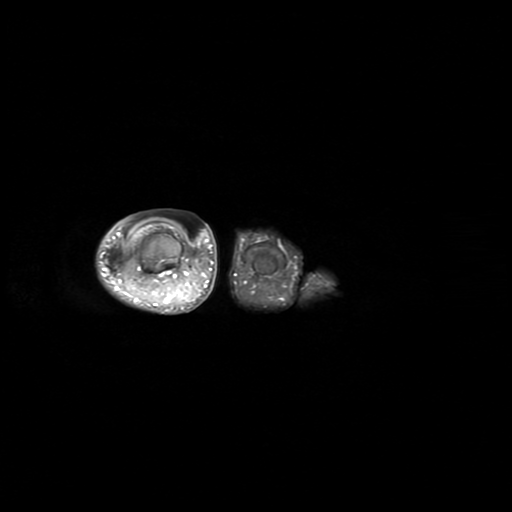
[im 13/49]
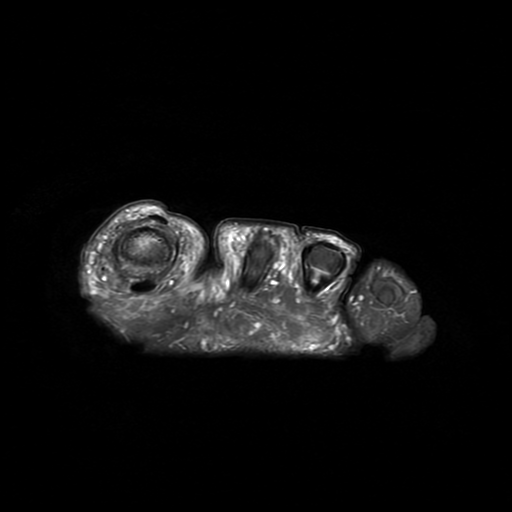
[im 19/49]
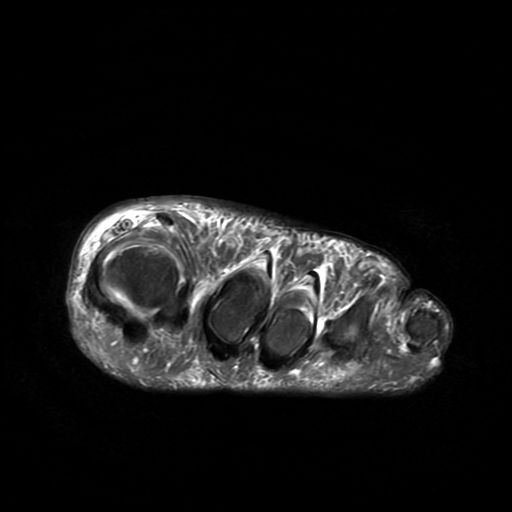
[im 25/49]
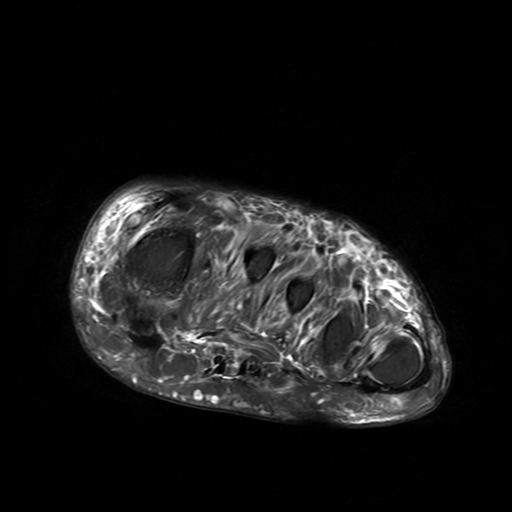
[im 31/49]
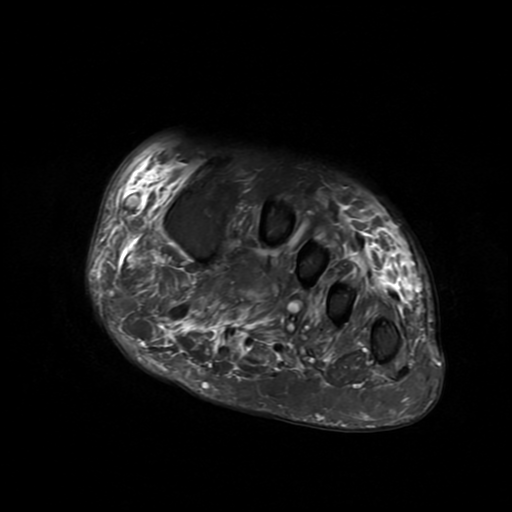
[im 37/49]
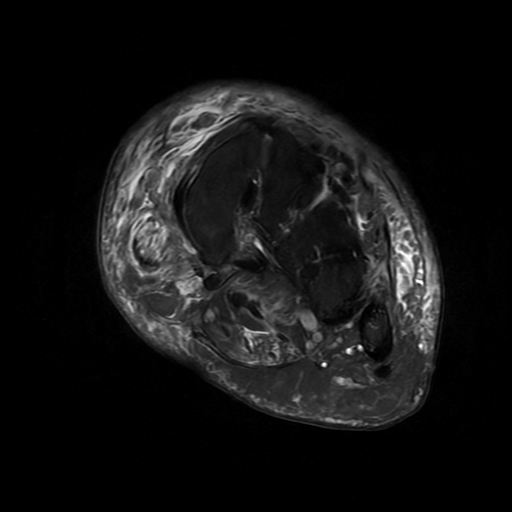
[im 43/49]
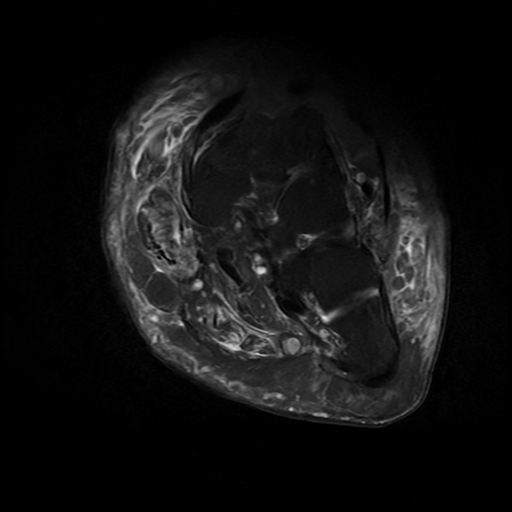
[im 49/49]
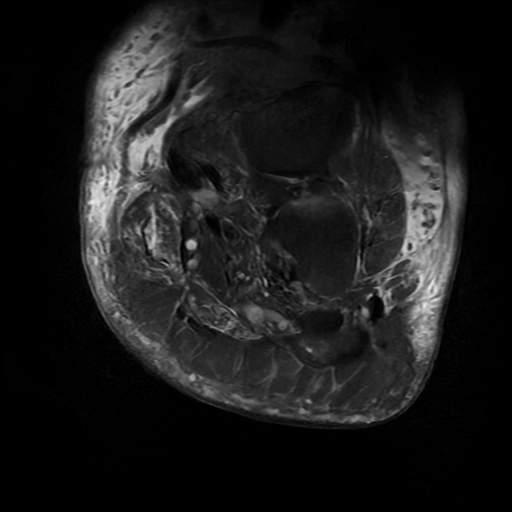

[Series 8: T1 · axial · 3.0mm · 0.29mm/px · z∈[-43,+17]mm · 3 of 20 slices shown (2 of 2)]
[im 1/20]
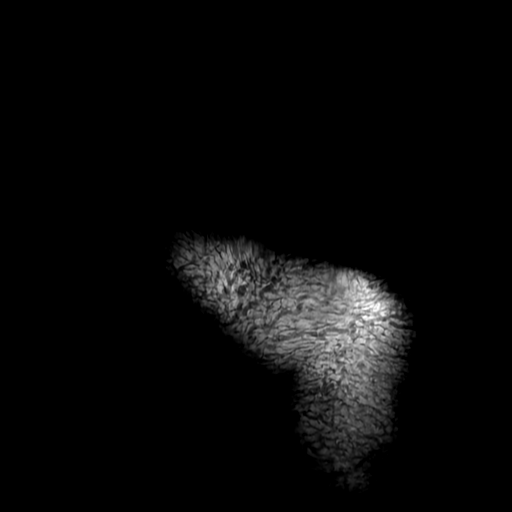
[im 13/20]
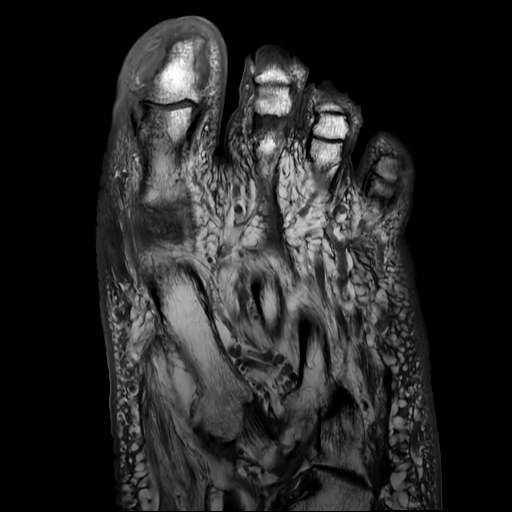
[im 20/20]
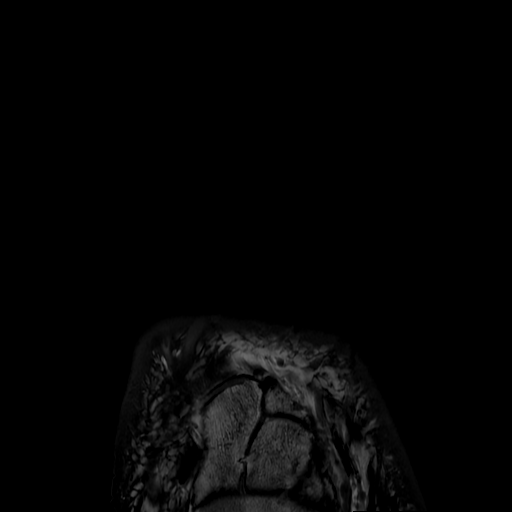

[19 of 40 positions shown; findings below may reference images not displayed]

FINDINGS: Bones/Joint/Cartilage

Reduction in the edema signal in the middle and distal phalanges of
the great toe likely reflecting improvement in osteomyelitis. No
interphalangeal joint effusion. Stable medial erosion of the head of
the proximal phalanx great toe, image 8 series 8. No other regions
of significant abnormal osseous edema are identified in the
forefoot.

Ligaments

Lisfranc ligament intact.

Muscles and Tendons

Atrophic regional musculature.

Soft tissues

Dorsal subcutaneous edema in the forefoot, similar to previous
laterally but with some increased dorsal edema medially compared to
previous.
IMPRESSION: 1. Reduction in edema signal in the middle and distal phalanges of
the great toe likely reflecting improvement in osteomyelitis.
2. Stable medial erosion of the head of the proximal phalanx great
toe.
3. Dorsal subcutaneous edema in the forefoot, similar to previous
laterally but with some increased dorsal edema medially compared to
previous.

## 2020-08-10 IMAGING — CR DG FOOT COMPLETE 3+V*L*
3 series · 3 of 3 positions shown · non-contrast
Comparison: [DATE]

CLINICAL DATA: Left foot and ankle pain

EXAM:
LEFT FOOT - COMPLETE 3+ VIEW; LEFT ANKLE COMPLETE - 3+ VIEW

[foot ap]
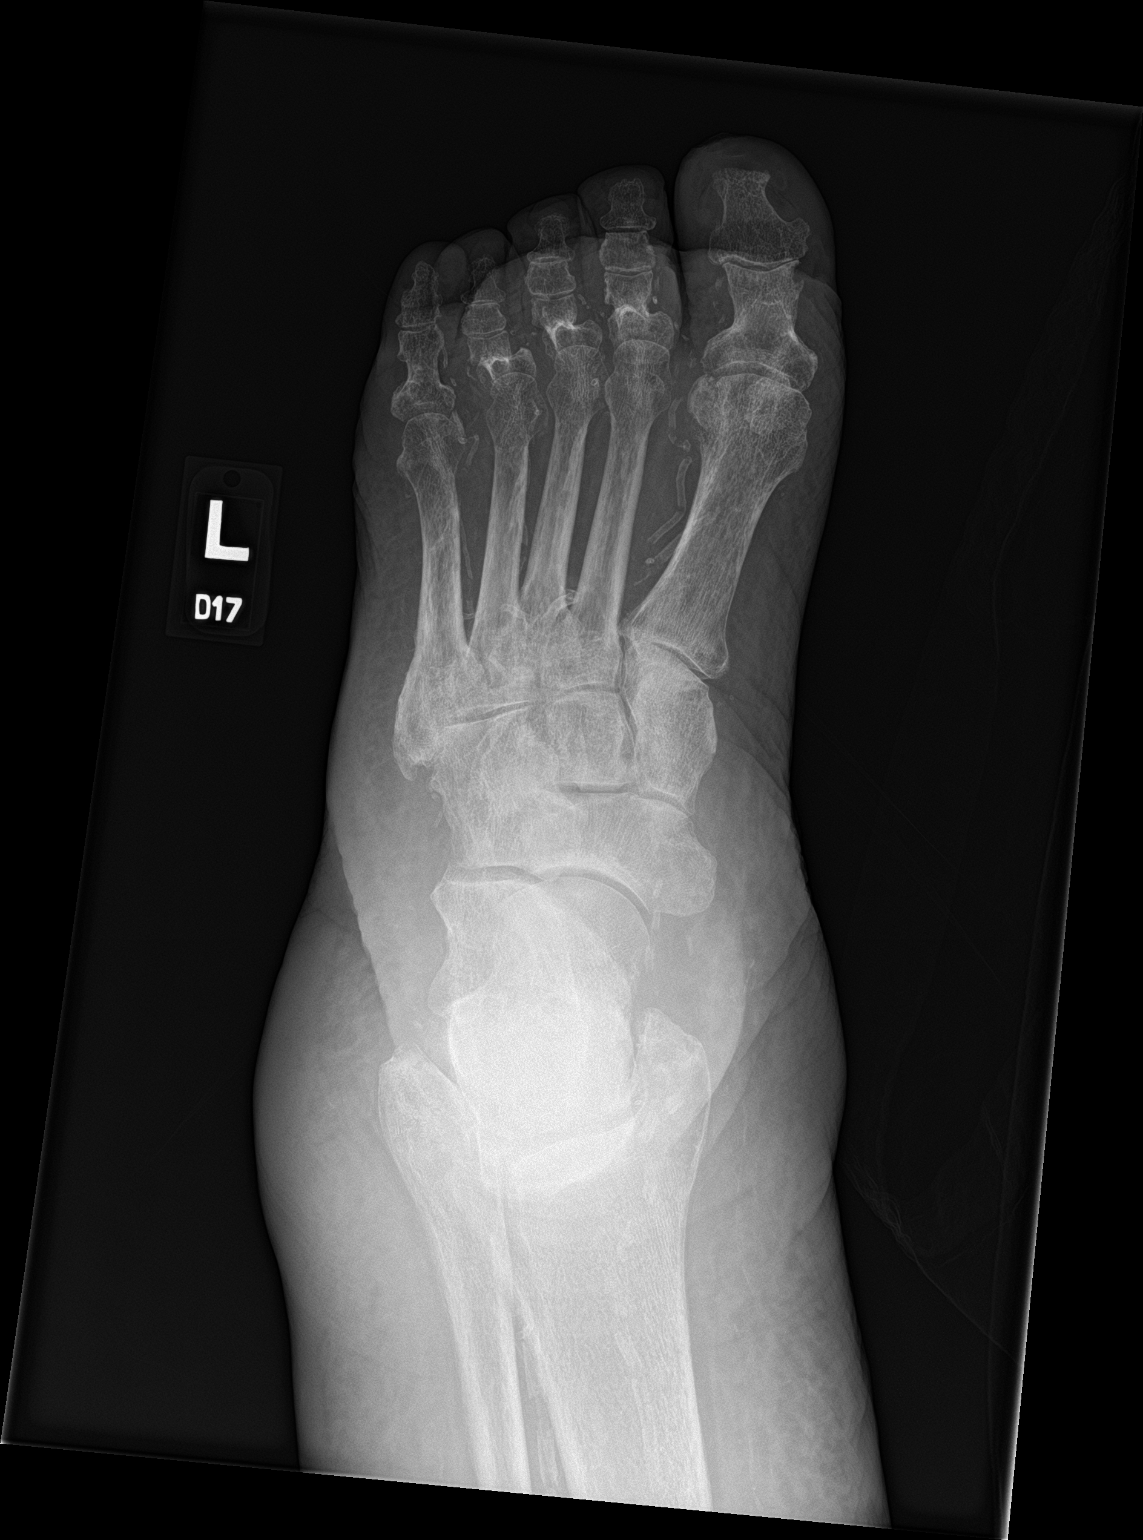

[foot obl]
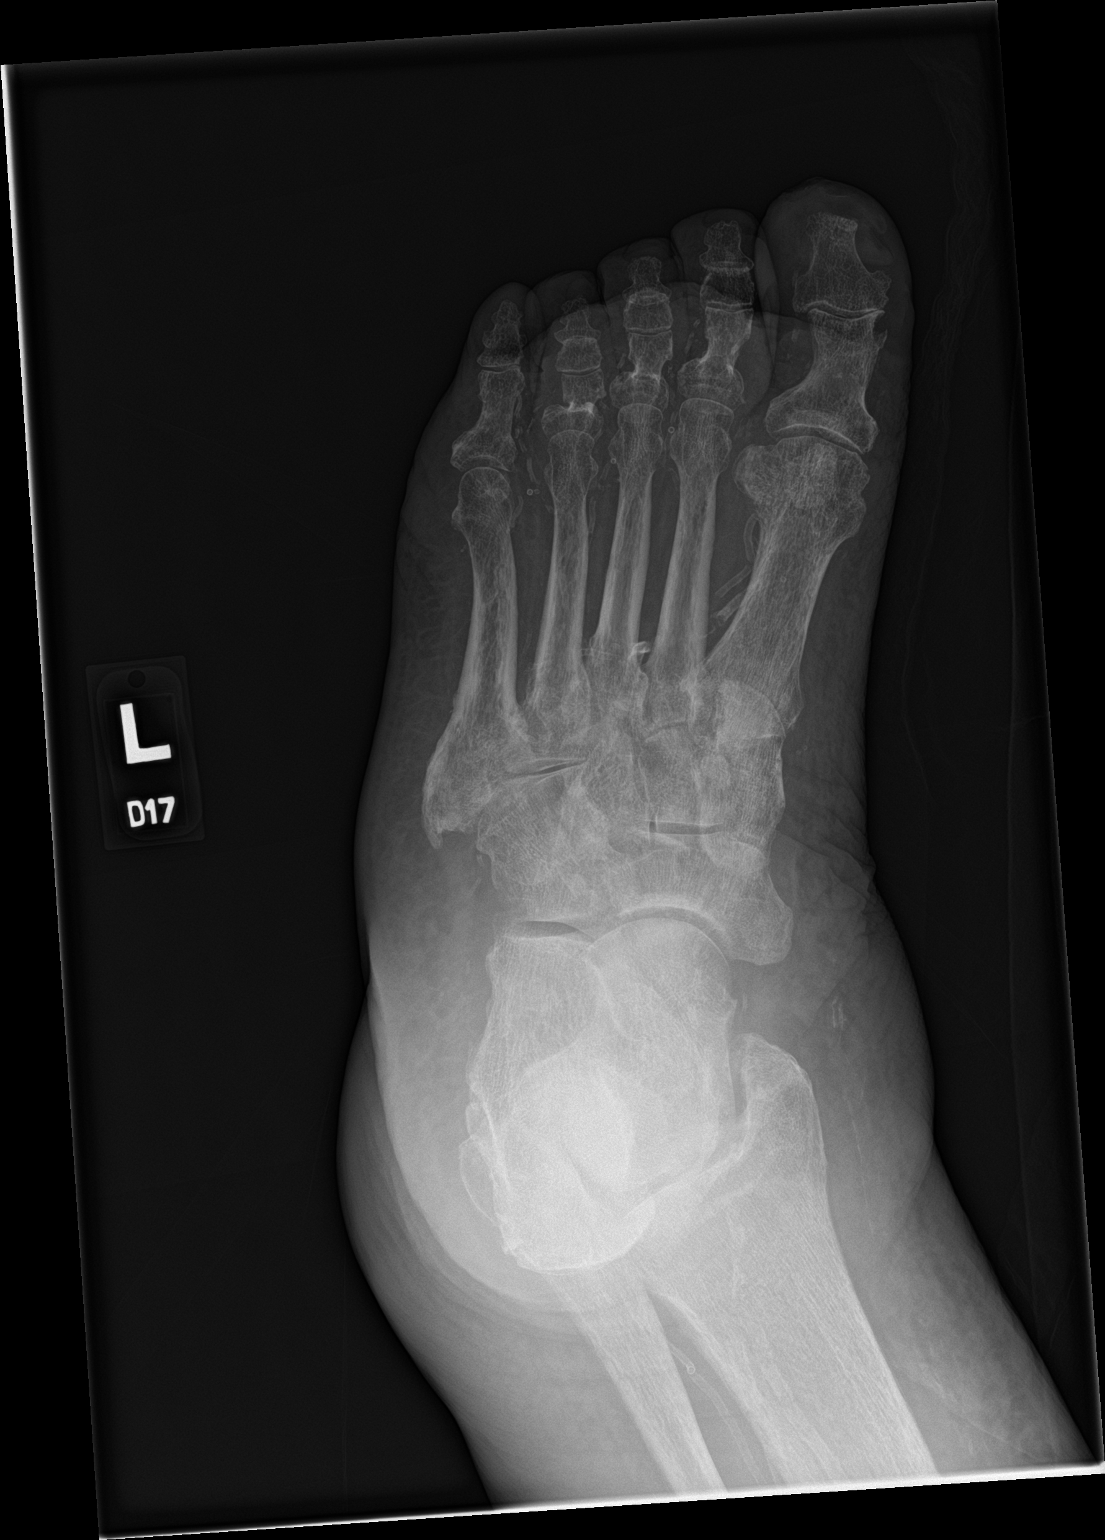

[foot lat]
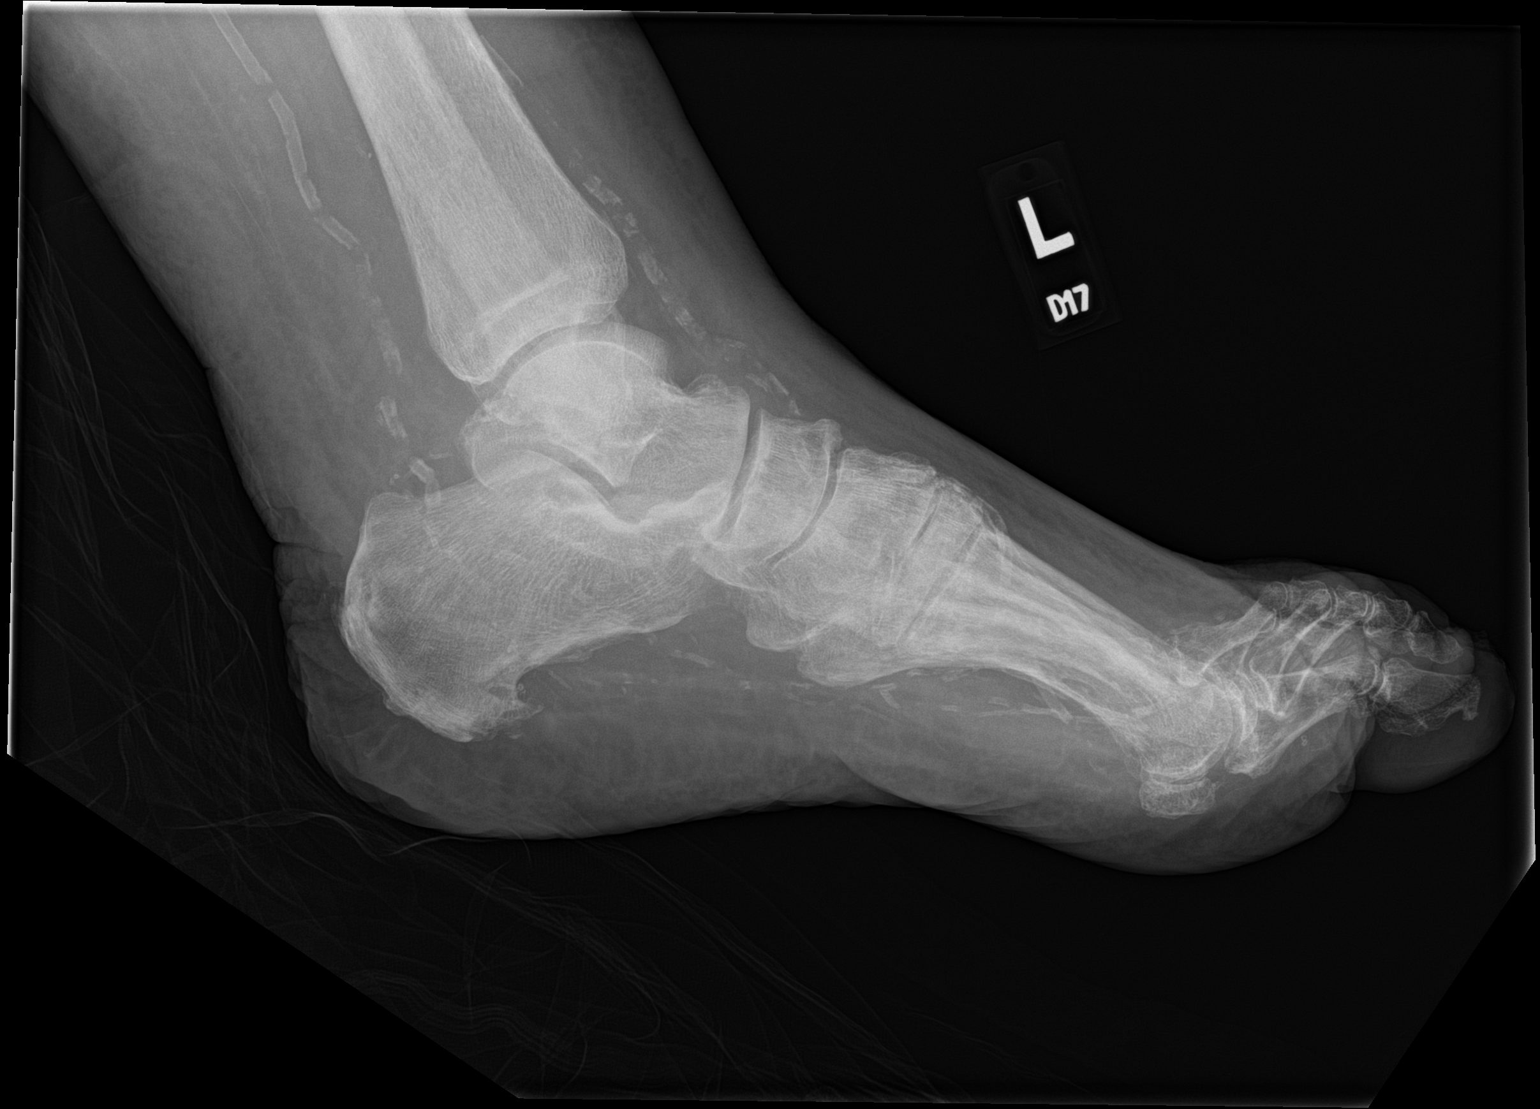

[3 of 3 positions shown; findings below may reference images not displayed]

FINDINGS: There is extensive soft tissue swelling about the patient's foot and
ankle. There is no acute displaced fracture or dislocation. Advanced
vascular calcifications are noted. There is a moderate-sized plantar
calcaneal spur. There are degenerative changes of the first
metatarsophalangeal joint. There is a persistent erosion at the head
of the proximal phalanx of the first digit.
IMPRESSION: 1. No acute displaced fracture or dislocation.
2. Persistent soft tissue swelling is noted about the ankle and
foot.
3. Unchanged erosion involving the head of the proximal phalanx of
the first digit.
4. Persistent vascular calcifications are noted.

## 2020-08-10 IMAGING — CR DG ANKLE COMPLETE 3+V*L*
3 series · 3 of 3 positions shown · non-contrast
Comparison: [DATE]

CLINICAL DATA: Left foot and ankle pain

EXAM:
LEFT FOOT - COMPLETE 3+ VIEW; LEFT ANKLE COMPLETE - 3+ VIEW

[ankle ap]
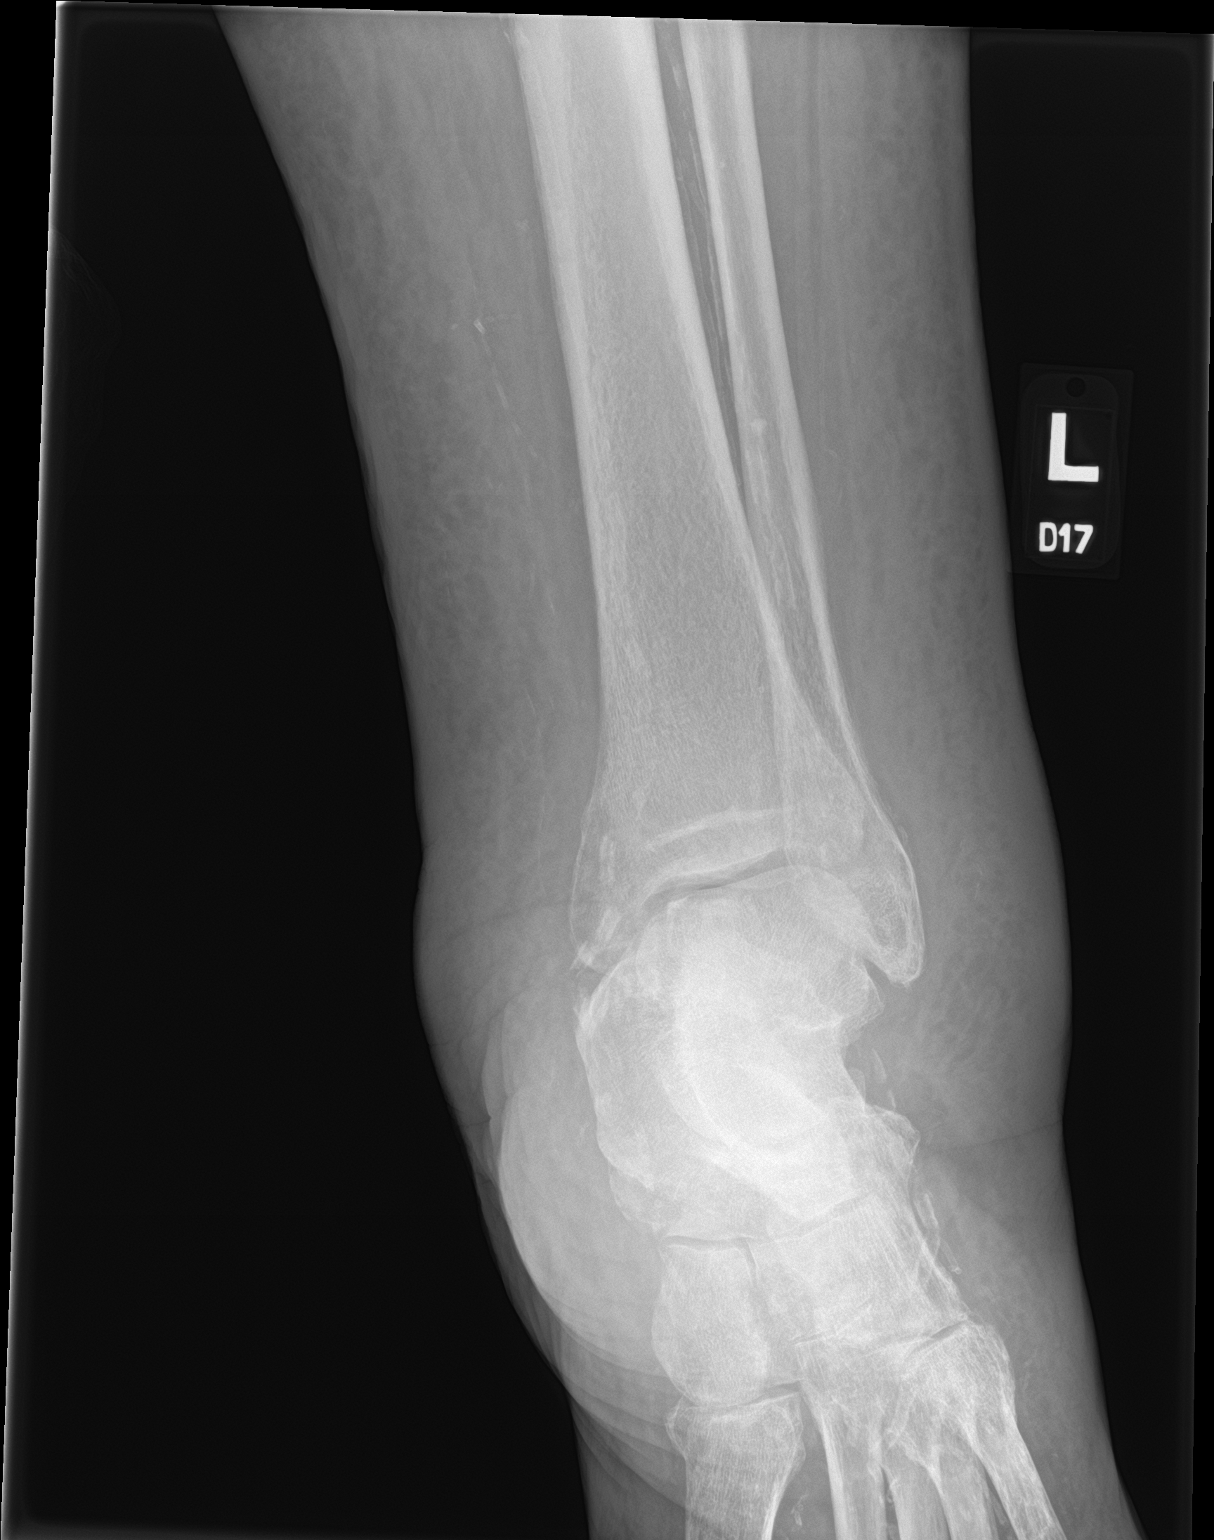

[ankle obl]
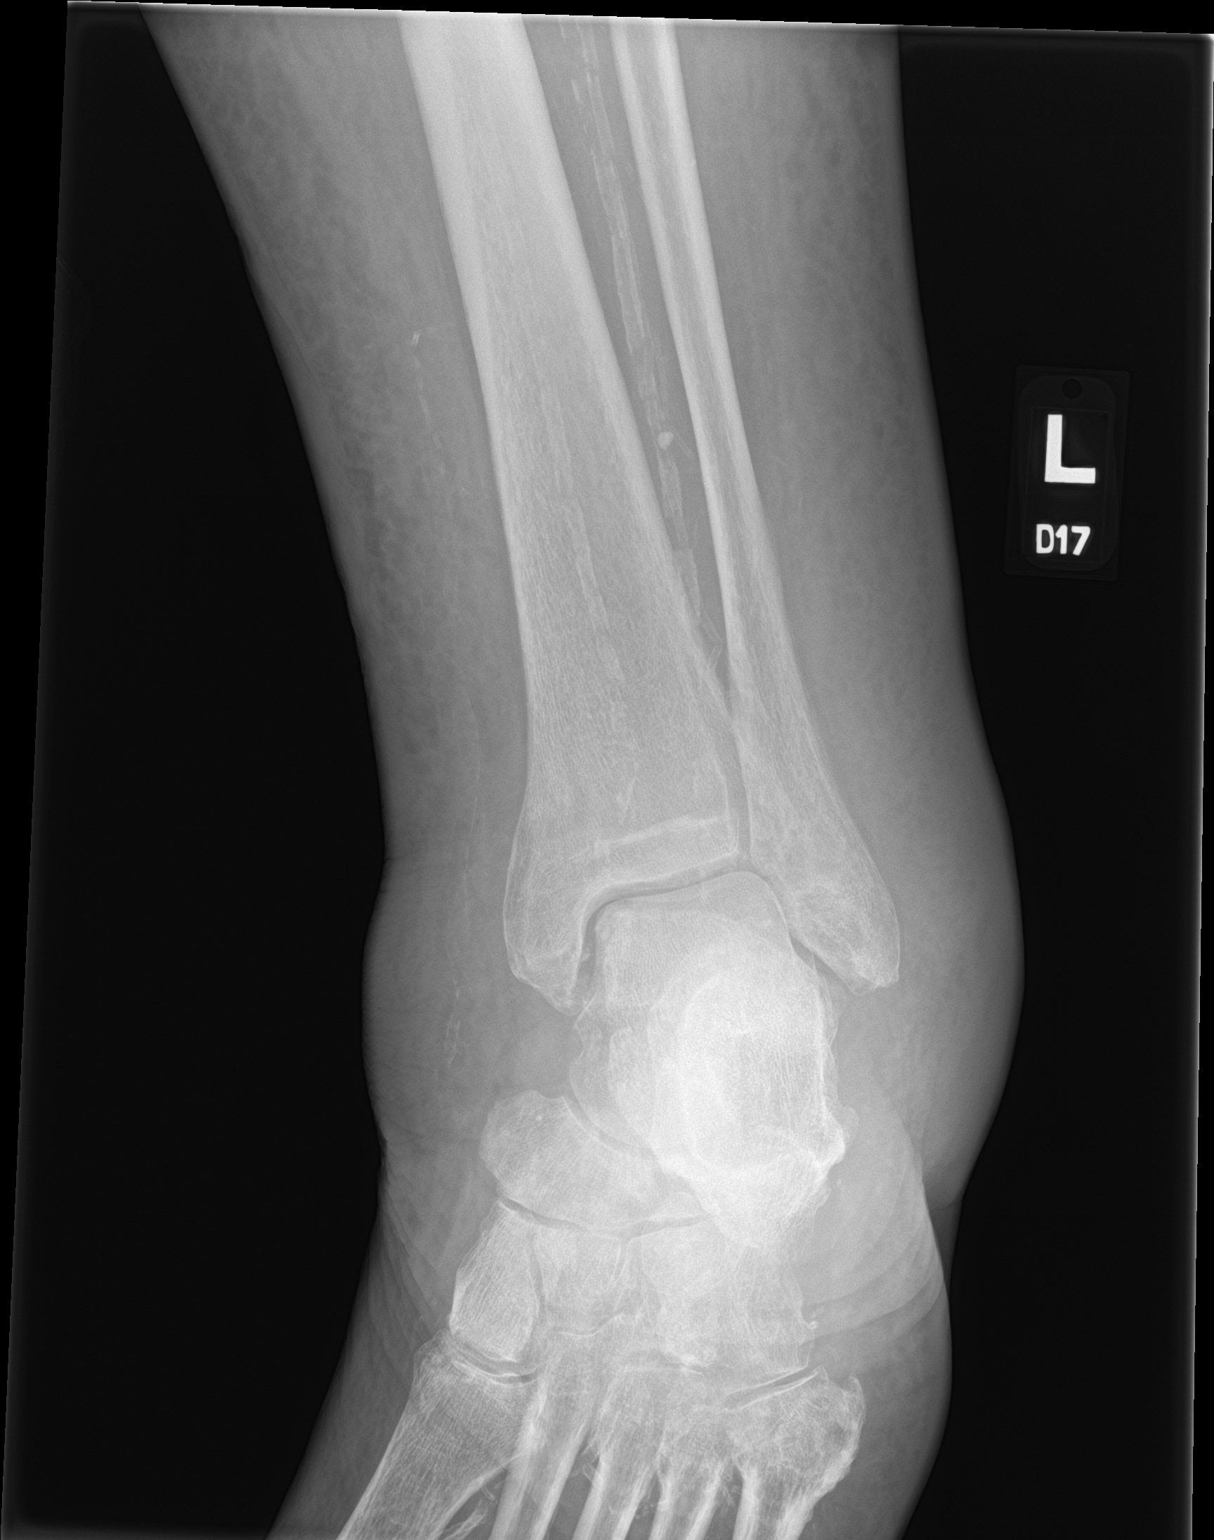

[ankle lat]
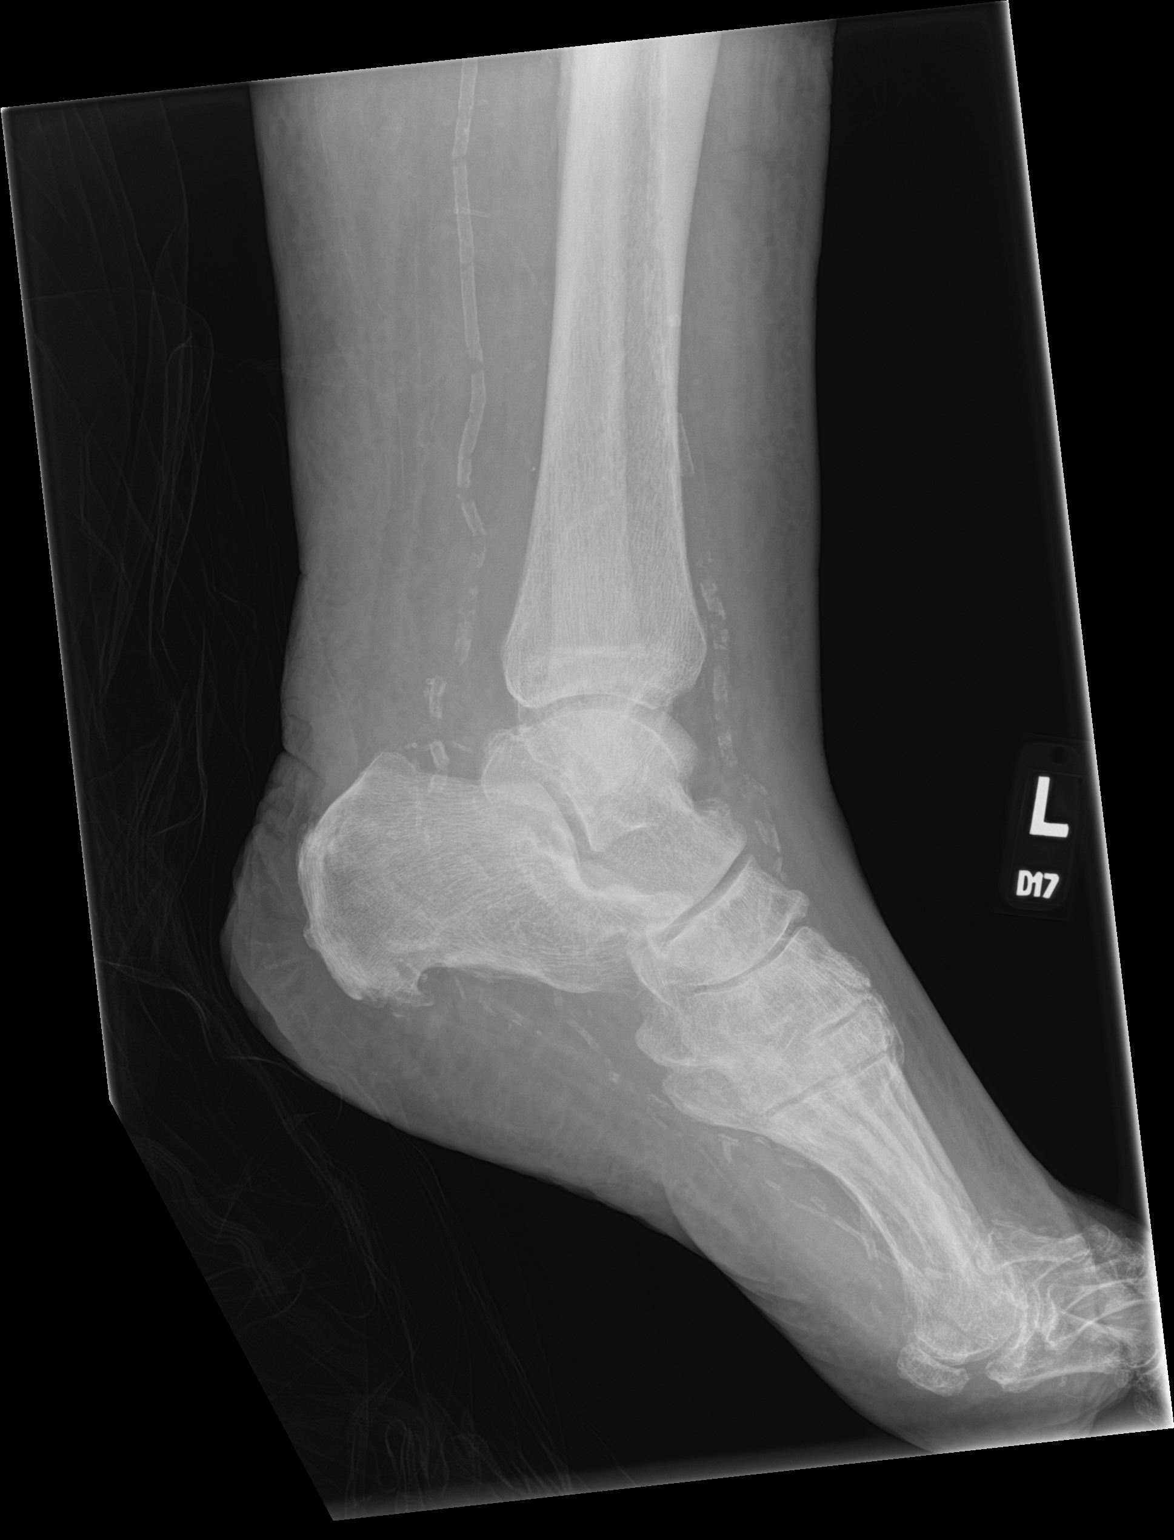

[3 of 3 positions shown; findings below may reference images not displayed]

FINDINGS: There is extensive soft tissue swelling about the patient's foot and
ankle. There is no acute displaced fracture or dislocation. Advanced
vascular calcifications are noted. There is a moderate-sized plantar
calcaneal spur. There are degenerative changes of the first
metatarsophalangeal joint. There is a persistent erosion at the head
of the proximal phalanx of the first digit.
IMPRESSION: 1. No acute displaced fracture or dislocation.
2. Persistent soft tissue swelling is noted about the ankle and
foot.
3. Unchanged erosion involving the head of the proximal phalanx of
the first digit.
4. Persistent vascular calcifications are noted.

## 2020-08-10 MED ORDER — HYDRALAZINE HCL 25 MG PO TABS: 25.0000 mg | ORAL_TABLET | Freq: Three times a day (TID) | ORAL | Status: DC | PRN

## 2020-08-10 MED ORDER — CHLORHEXIDINE GLUCONATE CLOTH 2 % EX PADS
6.0000 | MEDICATED_PAD | Freq: Every day | CUTANEOUS | Status: DC
  Administered 2020-08-10 – 2020-08-12 (×3): 6 via TOPICAL

## 2020-08-10 MED ORDER — SODIUM CHLORIDE 0.9 % IV SOLN
2.0000 g | Freq: Two times a day (BID) | INTRAVENOUS | Status: DC
  Administered 2020-08-10 – 2020-08-11 (×2): 2 g via INTRAVENOUS
  Filled 2020-08-10 (×2): qty 2

## 2020-08-10 NOTE — Consult Note (Signed)
Micheal Walls for Infectious Disease    Date of Admission:  08/09/2020     Reason for Consult: sepsis, ssi, hx left hallux ischemic gangrene with OM    Referring Provider: [place] Eric    Abx: 2/27-c linezolid 2/27-c cefepime  Previously doxy/augmentin since 07/22/20        Assessment: 85 yo male pvd, dm2, s/p right bka, chronic left hallux ulcer recently admitted 2/06 for stroke, with mri left foot showing possible osteomyelitis hallux (no cx/podiatry intervention at that time), placed on abx doxy/augmentin empiric left hallux om, but readmitted for sepsis with cellulitic changes around the left toe despite on abx  Severe sepsis with aki now improving Repeat mri left foot this admission actually suggest improving om changes of the left hallux. Clinically it also doensn't appear the hallux OM is doing worse. the cellulitic region is rather away from the ischemic toe. Surprised it occurs on abx; ?mrsa not amenable to doxy  2/27 bcx ngtd  Of note, he has a lle abi u/s on 2/06 which was indeterminate. I suspect his main issue os severe PVD, in which case iv/po abx won't matter long term if vascularization not optimized. Potentially (but less likely) an mdro bacteria not caught previously   Plan: 1. Keep on current abx for 7 days (at leat the linezolid), then resume previous doxy/augmentin on 08/18/2019 2. Repeat inflammatory markers 3. Please consult vascular surgery for evaluation vascular status in LLE 4. Will reschedule outpatient id clinic f/ua ppointment  Active Problems:   Sepsis ([place])   Scheduled Meds: . aspirin EC  81 mg Oral Daily  . Chlorhexidine Gluconate Cloth  6 each Topical Daily  . clopidogrel  75 mg Oral Daily  . ferrous sulfate  325 mg Oral Q M,W,F  . gabapentin  100 mg Oral BID  . heparin  5,000 Units Subcutaneous Q12H  . insulin aspart  0-9 Units Subcutaneous TID WC  . latanoprost  1 drop Both Eyes QHS  . pantoprazole  40 mg Oral QHS  .  rosuvastatin  10 mg Oral Daily   Continuous Infusions: . ceFEPime (MAXIPIME) IV    . lactated ringers 75 mL/hr at 08/09/20 1840  . linezolid (ZYVOX) IV 600 mg (08/10/20 1005)   PRN Meds:.acetaminophen **OR** acetaminophen, HYDROmorphone (DILAUDID) injection, ondansetron **OR** ondansetron (ZOFRAN) IV, senna-docusate  HPI: Micheal Walls is a 85 y.o. male known to me from previous admission on 07/19/2020  He was admitted for stroke. He had a chronic left foot ulcer. An mri was obtained then and suggested hallux om.   I recommended doxy/augmentin with which he was discharged on.  He initially did well but the last 1-2 days new redness/pain left foot progressive. Mentation decreasing in this setting.   He has been following with wound care as well outpatient. Family reports stable swellling left foot, but new rash/pain there up to left shin 1-2 days priort o admission  He presented with severe sepsis with aki, but no fever Since admission, there is some improvement in swelling  He appears to have chronic venous stasis LLE   Review of Systems: ROS Other ros negative  Past Medical History:  Diagnosis Date  . Acute metabolic encephalopathy 0/0/3491  . Amputated below knee ([place])    right  . Anemia   . Aspiration pneumonia ([place]) 11/02/2019  . Diastolic heart failure ([place])   . Diverticulosis   . DM (diabetes mellitus) ([place])   . Gout   .  Hyperlipemia   . OSA on CPAP   . RBBB   . Sepsis ([place]) 11/02/2019  . Systemic hypertension     Social History   Tobacco Use  . Smoking status: Former Smoker    Types: Cigars    Quit date: 06/12/2001    Years since quitting: 19.1  . Smokeless tobacco: Never Used  Vaping Use  . Vaping Use: Former  Substance Use Topics  . Alcohol use: No    Alcohol/week: 0.0 standard drinks  . Drug use: No    Family History  Problem Relation Age of Onset  . Diabetes Mother   . Heart attack Father   . Cancer Sister    Allergies  Allergen  Reactions  . Vancomycin Rash    OBJECTIVE: Blood pressure (!) 121/54, pulse 87, temperature 97.8 F (36.6 C), temperature source Oral, resp. rate 20, height $RemoveBe'6\' 1"'rTAohexPy$  (1.854 m), weight 127 kg, SpO2 99 %.  Physical Exam No distress, conversant Heent: normocephalic; per; conj clear; eomi Neck supple cv rrr no mrg Lungs clear; normal respiratory effort abd s/nt Ext 1-2 plus edema LLE; right bka stump nontender Skin large vessicular changes around distal RLE not involving the foot; tender and warm to palpation; wrinkling skin noted msk no synovitis; the distal part of the left hallux appears to have dry gangrene without discharge/redness/swelling Neuro nonfocal; alert/oriented Psych mood normal  Lab Results Lab Results  Component Value Date   WBC 12.1 (H) 08/10/2020   HGB 9.5 (L) 08/10/2020   HCT 27.4 (L) 08/10/2020   MCV 93.8 08/10/2020   PLT 205 08/10/2020    Lab Results  Component Value Date   CREATININE 2.03 (H) 08/10/2020   BUN 86 (H) 08/10/2020   NA 142 08/10/2020   K 3.8 08/10/2020   CL 111 08/10/2020   CO2 19 (L) 08/10/2020    Lab Results  Component Value Date   ALT 20 08/09/2020   AST 28 08/09/2020   ALKPHOS 42 08/09/2020   BILITOT 0.9 08/09/2020     Microbiology: Recent Results (from the past 240 hour(s))  Resp Panel by RT-PCR (Flu A&B, Covid) Nasopharyngeal Swab     Status: None   Collection Time: 08/09/20  9:26 AM   Specimen: Nasopharyngeal Swab; Nasopharyngeal(NP) swabs in vial transport medium  Result Value Ref Range Status   SARS Coronavirus 2 by RT PCR NEGATIVE NEGATIVE Final    Comment: (NOTE) SARS-CoV-2 target nucleic acids are NOT DETECTED.  The SARS-CoV-2 RNA is generally detectable in upper respiratory specimens during the acute phase of infection. The lowest concentration of SARS-CoV-2 viral copies this assay can detect is 138 copies/mL. A negative result does not preclude SARS-Cov-2 infection and should not be used as the sole basis for  treatment or other patient management decisions. A negative result may occur with  improper specimen collection/handling, submission of specimen other than nasopharyngeal swab, presence of viral mutation(s) within the areas targeted by this assay, and inadequate number of viral copies(<138 copies/mL). A negative result must be combined with clinical observations, patient history, and epidemiological information. The expected result is Negative.  Fact Sheet for Patients:  EntrepreneurPulse.com.au  Fact Sheet for Healthcare Providers:  IncredibleEmployment.be  This test is no t yet approved or cleared by the [place] FDA and  has been authorized for detection and/or diagnosis of SARS-CoV-2 by FDA under an Emergency Use Authorization (EUA). This EUA will remain  in effect (meaning this test can be used) for the duration of the COVID-19 declaration under  Section 564(b)(1) of the Act, 21 U.S.C.section 360bbb-3(b)(1), unless the authorization is terminated  or revoked sooner.       Influenza A by PCR NEGATIVE NEGATIVE Final   Influenza B by PCR NEGATIVE NEGATIVE Final    Comment: (NOTE) The Xpert Xpress SARS-CoV-2/FLU/RSV plus assay is intended as an aid in the diagnosis of influenza from Nasopharyngeal swab specimens and should not be used as a sole basis for treatment. Nasal washings and aspirates are unacceptable for Xpert Xpress SARS-CoV-2/FLU/RSV testing.  Fact Sheet for Patients: EntrepreneurPulse.com.au  Fact Sheet for Healthcare Providers: IncredibleEmployment.be  This test is not yet approved or cleared by the [place] FDA and has been authorized for detection and/or diagnosis of SARS-CoV-2 by FDA under an Emergency Use Authorization (EUA). This EUA will remain in effect (meaning this test can be used) for the duration of the COVID-19 declaration under Section 564(b)(1) of the Act, 21  U.S.C. section 360bbb-3(b)(1), unless the authorization is terminated or revoked.  Performed at [facility], [place] [address], [place], [place] [address]   Urine culture     Status: None   Collection Time: 08/09/20 11:59 AM   Specimen: In/Out Cath Urine  Result Value Ref Range Status   Specimen Description IN/OUT CATH URINE  Final   Special Requests NONE  Final   Culture   Final    NO GROWTH Performed at [facility], [place] [address], [place], [place] [address]    Report Status 08/10/2020 FINAL  Final     Serology:   Imaging: If present, new imagings (plain films, ct scans, and mri) have been personally visualized and interpreted; radiology reports have been reviewed. Decision making incorporated into the Impression / Recommendations.  2/28 mri left foot 1. Reduction in edema signal in the middle and distal phalanges of the great toe likely reflecting improvement in osteomyelitis. 2. Stable medial erosion of the head of the proximal phalanx great toe. 3. Dorsal subcutaneous edema in the forefoot, similar to previous laterally but with some increased dorsal edema medially compared to previous.  Jabier Mutton, [place] [facility] [place] [phone] pager    08/10/2020, 12:15 PM

## 2020-08-10 NOTE — Chronic Care Management (AMB) (Addendum)
  Chronic Care Management   Inpatient Admit Review Note  08/10/2020 Name: Micheal Walls MRN: [id number] DOB: 11-23-1935  Micheal Walls is a 85 y.o. year old male who is a primary care patient of [name], [place]. Micheal Walls is eligible to work with the embedded care management team in the primary care practice. SW schedule to contact the patient to introduce embedded care management program for assistance with disease management and care coordination needs related to HTN and DM.   Micheal Walls is currently admitted to the hospital for evaluation and treatment of Tachycardia, Hypotension, Generalized Weakness, and Cellulitis..   Plan: CM team will collaborate with [facility]iaison to inform of patient eligibility for referral to [place] with PCP who has OV scheduled for 08/18/20.   [name], [name], [name], Certified Dementia Practitioner [place] [facility] [phone]

## 2020-08-10 NOTE — Consult Note (Signed)
Reason for Consult: Sepsis, left hallux wound Referring Physician: Grandville Silos, DO  Micheal Walls is an 85 y.o. male.  HPI: He is well-known to me from the outpatient setting from his chronic ulceration.  He is recently being followed by Dr. Dellia Nims in the wound care center.  According to his son and his daughter who is available telephone and the patient self, this continues to wax and wane, he has been hospitalized a few times recently with severe swelling and redness in the leg.  The wound has been about the same appearance, they do not recall any purulent drainage or depreciation of the wound itself.  Past Medical History:  Diagnosis Date  . Acute metabolic encephalopathy 11/18/3417  . 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 Anemia   . Aspiration pneumonia ([place]) 11/02/2019  . Diastolic heart failure ([place])   . Diverticulosis   . DM (diabetes mellitus) ([place])   . Gout   . Hyperlipemia   . OSA on CPAP   . RBBB   . Sepsis ([place]) 11/02/2019  . Systemic hypertension     Past Surgical History:  Procedure Laterality Date  . LEG AMPUTATION BELOW KNEE     right  . NM MYOCAR PERF WALL MOTION  09/18/2009   no ischemia    Family History  Problem Relation Age of Onset  . Diabetes Mother   . Heart attack Father   . Cancer Sister     Social History:  reports that he quit smoking about 19 years ago. His smoking use included cigars. He has never used smokeless tobacco. He reports that he does not drink alcohol and does not use drugs.  Allergies:  Allergies  Allergen Reactions  . Vancomycin Rash    Medications: I have reviewed the patient's current medications.  Results for orders placed or performed during the hospital encounter of 08/09/20 (from the past 48 hour(s))  Lactic acid, plasma     Status: Abnormal   Collection Time: 08/09/20  8:58 AM  Result Value Ref Range   Lactic Acid, Venous 4.1 (HH) 0.5 - 1.9 mmol/L    Comment: CRITICAL RESULT CALLED TO, READ BACK BY AND  VERIFIED WITH: KIM SPRANGERS RN.$RemoveBeforeDE'@[id number]$  ON 2.27.22 BY TCALDWELL MT. Performed at [facility], [address] [place], [place] [address]   Comprehensive metabolic panel     Status: Abnormal   Collection Time: 08/09/20  8:58 AM  Result Value Ref Range   Sodium 140 135 - 145 mmol/L   Potassium 3.9 3.5 - 5.1 mmol/L   Chloride 108 98 - 111 mmol/L   CO2 17 (L) 22 - 32 mmol/L   Glucose, Bld 175 (H) 70 - 99 mg/dL    Comment: Glucose reference range applies only to samples taken after fasting for at least 8 hours.   BUN 104 (H) 8 - 23 mg/dL   Creatinine, Ser 2.85 (H) 0.61 - 1.24 mg/dL   Calcium 9.6 8.9 - 10.3 mg/dL   Total Protein 6.4 (L) 6.5 - 8.1 g/dL   Albumin 3.1 (L) 3.5 - 5.0 g/dL   AST 28 15 - 41 U/L   ALT 20 0 - 44 U/L   Alkaline Phosphatase 42 38 - 126 U/L   Total Bilirubin 0.9 0.3 - 1.2 mg/dL   GFR, Estimated 21 (L) >60 mL/min    Comment: (NOTE) Calculated using the CKD-EPI Creatinine Equation (2021)    Anion gap 15 5 - 15    Comment: Performed  at [facility], [place] [address], [place], [place] [address]  CBC WITH DIFFERENTIAL     Status: Abnormal   Collection Time: 08/09/20  8:58 AM  Result Value Ref Range   WBC 9.9 4.0 - 10.5 K/uL   RBC 3.25 (L) 4.22 - 5.81 MIL/uL   Hemoglobin 10.0 (L) 13.0 - 17.0 g/dL   HCT 31.9 (L) 39.0 - 52.0 %   MCV 98.2 80.0 - 100.0 fL   MCH 30.8 26.0 - 34.0 pg   MCHC 31.3 30.0 - 36.0 g/dL   RDW 16.8 (H) 11.5 - 15.5 %   Platelets 219 150 - 400 K/uL   nRBC 0.0 0.0 - 0.2 %   Neutrophils Relative % 83 %   Neutro Abs 8.2 (H) 1.7 - 7.7 K/uL   Lymphocytes Relative 6 %   Lymphs Abs 0.6 (L) 0.7 - 4.0 K/uL   Monocytes Relative 10 %   Monocytes Absolute 1.0 0.1 - 1.0 K/uL   Eosinophils Relative 0 %   Eosinophils Absolute 0.0 0.0 - 0.5 K/uL   Basophils Relative 0 %   Basophils Absolute 0.0 0.0 - 0.1 K/uL   Immature Granulocytes 1 %   Abs Immature Granulocytes 0.13 (H) 0.00 - 0.07 K/uL    Comment: Performed at [facility], [address] [place], [place] [address]  Protime-INR     Status: None   Collection Time: 08/09/20  8:58 AM  Result Value Ref Range   Prothrombin Time 14.9 11.4 - 15.2 seconds   INR 1.2 0.8 - 1.2    Comment: (NOTE) INR goal varies based on device and disease states. Performed at [facility], [place] [address], [place], [place] [address]   APTT     Status: None   Collection Time: 08/09/20  8:58 AM  Result Value Ref Range   aPTT 27 24 - 36 seconds    Comment: Performed at [facility] [address] [place], [place] [address]  Troponin I (High Sensitivity)     Status: Abnormal   Collection Time: 08/09/20  8:58 AM  Result Value Ref Range   Troponin I (High Sensitivity) 112 (HH) <18 ng/L    Comment: CRITICAL RESULT CALLED TO, READ BACK BY AND VERIFIED WITH: KIM SPRANGERS RN.$RemoveBeforeDE'@[id number]$  ON 2.27.22 BY TCALDWELL MT. (NOTE) Elevated high sensitivity troponin I (hsTnI) values and significant  changes across serial measurements may suggest ACS but many other  chronic and acute conditions are known to elevate hsTnI results.  Refer to the Links section for chest pain algorithms and additional  guidance. Performed at [facility], [place] [address] [place], [place] [address]   Magnesium     Status: None   Collection Time: 08/09/20  8:58 AM  Result Value Ref Range   Magnesium 2.1 1.7 - 2.4 mg/dL    Comment: Performed at [facility] [address] [place], [place] [address]  CK     Status: None   Collection Time: 08/09/20  8:58 AM  Result Value Ref Range   Total CK 362 49 - 397 U/L    Comment: Performed at [facility], [place] [address] [place], [place] [address]  Lipase, blood     Status: Abnormal   Collection Time: 08/09/20  8:58 AM  Result Value Ref Range   Lipase 69 (H) 11 - 51 U/L    Comment: Performed at [facility] [address] [place], [place] [address]  Resp Panel by RT-PCR (Flu A&B, Covid) Nasopharyngeal Swab     Status: None   Collection Time:  08/09/20  9:26 AM   Specimen: Nasopharyngeal Swab; Nasopharyngeal(NP) swabs in vial transport medium  Result Value Ref Range   SARS Coronavirus 2 by RT PCR NEGATIVE NEGATIVE    Comment: (NOTE) SARS-CoV-2 target nucleic acids are NOT DETECTED.  The SARS-CoV-2 RNA is generally detectable in upper respiratory specimens during the acute phase of infection. The lowest concentration of SARS-CoV-2 viral copies this assay can detect is 138 copies/mL. A negative result does not preclude SARS-Cov-2 infection and should not be used as the sole basis for treatment or other patient management decisions. A negative result may occur with  improper specimen collection/handling, submission of specimen other than nasopharyngeal swab, presence of viral mutation(s) within the areas targeted by this assay, and inadequate number of viral copies(<138 copies/mL). A negative result must be combined with clinical observations, patient history, and epidemiological information. The expected result is Negative.  Fact Sheet for Patients:  BloggerCourse.com  Fact Sheet for Healthcare Providers:  SeriousBroker.it  This test is no t yet approved or cleared by the [place] FDA and  has been authorized for detection and/or diagnosis of SARS-CoV-2 by FDA under an Emergency Use Authorization (EUA). This EUA will remain  in effect (meaning this test can be used) for the duration of the COVID-19 declaration under Section 564(b)(1) of the Act, 21 U.S.C.section 360bbb-3(b)(1), unless the authorization is terminated  or revoked sooner.       Influenza A by PCR NEGATIVE NEGATIVE   Influenza B by PCR NEGATIVE NEGATIVE    Comment: (NOTE) The Xpert Xpress SARS-CoV-2/FLU/RSV plus assay is intended as an aid in the diagnosis of influenza from Nasopharyngeal swab specimens and should not be used as a sole basis for treatment. Nasal washings and aspirates are  unacceptable for Xpert Xpress SARS-CoV-2/FLU/RSV testing.  Fact Sheet for Patients: BloggerCourse.com  Fact Sheet for Healthcare Providers: SeriousBroker.it  This test is not yet approved or cleared by the [place] FDA and has been authorized for detection and/or diagnosis of SARS-CoV-2 by FDA under an Emergency Use Authorization (EUA). This EUA will remain in effect (meaning this test can be used) for the duration of the COVID-19 declaration under Section 564(b)(1) of the Act, 21 U.S.C. section 360bbb-3(b)(1), unless the authorization is terminated or revoked.  Performed at [facility], [address], [place], [place] [address]   Blood Culture (routine x 2)     Status: None (Preliminary result)   Collection Time: 08/09/20  9:26 AM   Specimen: BLOOD  Result Value Ref Range   Specimen Description BLOOD RIGHT ANTECUBITAL    Special Requests      BOTTLES DRAWN AEROBIC AND ANAEROBIC Blood Culture adequate volume   Culture      NO GROWTH 1 DAY Performed at [facility], [address] [place], [place] [address]    Report Status PENDING   Blood Culture (routine x 2)     Status: None (Preliminary result)   Collection Time: 08/09/20  9:31 AM   Specimen: BLOOD  Result Value Ref Range   Specimen Description BLOOD RIGHT ANTECUBITAL    Special Requests      BOTTLES DRAWN AEROBIC AND ANAEROBIC Blood Culture adequate volume   Culture      NO GROWTH 1 DAY Performed at [facility], [address], [place], [place] [address]    Report Status PENDING  I-stat chem 8, ED (not at [facility] or [facility])     Status: Abnormal   Collection Time: 08/09/20  9:54 AM  Result Value Ref Range   Sodium 143 135 - 145 mmol/L   Potassium 3.9 3.5 - 5.1 mmol/L   Chloride 108 98 - 111 mmol/L   BUN 102 (H) 8 - 23 mg/dL   Creatinine, Ser 2.80 (H) 0.61 - 1.24 mg/dL   Glucose, Bld 171 (H) 70 - 99 mg/dL    Comment: Glucose reference range  applies only to samples taken after fasting for at least 8 hours.   Calcium, Ion 1.29 1.15 - 1.40 mmol/L   TCO2 19 (L) 22 - 32 mmol/L   Hemoglobin 10.5 (L) 13.0 - 17.0 g/dL   HCT 31.0 (L) 39.0 - 52.0 %  Lactic acid, plasma     Status: Abnormal   Collection Time: 08/09/20 11:23 AM  Result Value Ref Range   Lactic Acid, Venous 2.8 (HH) 0.5 - 1.9 mmol/L    Comment: CRITICAL VALUE NOTED.  VALUE IS CONSISTENT WITH PREVIOUSLY REPORTED AND CALLED VALUE. Performed at [facility], [place] [address], [place], [place] [address]   Troponin I (High Sensitivity)     Status: Abnormal   Collection Time: 08/09/20 11:23 AM  Result Value Ref Range   Troponin I (High Sensitivity) 114 (HH) <18 ng/L    Comment: CRITICAL VALUE NOTED.  VALUE IS CONSISTENT WITH PREVIOUSLY REPORTED AND CALLED VALUE. (NOTE) Elevated high sensitivity troponin I (hsTnI) values and significant  changes across serial measurements may suggest ACS but many other  chronic and acute conditions are known to elevate hsTnI results.  Refer to the Links section for chest pain algorithms and additional  guidance. Performed at [facility], [place] [address] [place], [place] [address]   Urinalysis, Routine w reflex microscopic Urine, Catheterized     Status: Abnormal   Collection Time: 08/09/20 11:59 AM  Result Value Ref Range   Color, Urine YELLOW YELLOW   APPearance HAZY (A) CLEAR   Specific Gravity, Urine 1.012 1.005 - 1.030   pH 5.0 5.0 - 8.0   Glucose, UA NEGATIVE NEGATIVE mg/dL   Hgb urine dipstick NEGATIVE NEGATIVE   Bilirubin Urine NEGATIVE NEGATIVE   Ketones, ur NEGATIVE NEGATIVE mg/dL   Protein, ur 30 (A) NEGATIVE mg/dL   Nitrite NEGATIVE NEGATIVE   Leukocytes,Ua NEGATIVE NEGATIVE   RBC / HPF 0-5 0 - 5 RBC/hpf   WBC, UA 0-5 0 - 5 WBC/hpf   Bacteria, UA NONE SEEN NONE SEEN   Squamous Epithelial / LPF 0-5 0 - 5   Mucus PRESENT    Hyaline Casts, UA PRESENT     Comment: Performed at [facility], [place]  [address] [place], [place] [address]  Urine culture     Status: None   Collection Time: 08/09/20 11:59 AM   Specimen: In/Out Cath Urine  Result Value Ref Range   Specimen Description IN/OUT CATH URINE    Special Requests NONE    Culture      NO GROWTH Performed at [facility], [place] [address] [place], [place] [address]    Report Status 08/10/2020 FINAL   Blood gas, venous     Status: Abnormal   Collection Time: 08/09/20  6:51 PM  Result Value Ref Range   FIO2 21.00    pH, Ven 7.301 7.250 - 7.430   pCO2, Ven 41.5 (L) 44.0 - 60.0 mmHg   Bicarbonate 19.8 (L)  20.0 - 28.0 mmol/L   Acid-base deficit 5.5 (H) 0.0 - 2.0 mmol/L   O2 Saturation 78.6 %   Patient temperature 37.0    Collection site RIGHT ANTECUBITAL    Drawn by [phone]    Sample type VENOUS     Comment: Performed at [facility], [place] [address] [place], [place] [address]  Glucose, capillary     Status: Abnormal   Collection Time: 08/09/20  9:29 PM  Result Value Ref Range   Glucose-Capillary 103 (H) 70 - 99 mg/dL    Comment: Glucose reference range applies only to samples taken after fasting for at least 8 hours.  Basic metabolic panel     Status: Abnormal   Collection Time: 08/10/20 12:59 AM  Result Value Ref Range   Sodium 142 135 - 145 mmol/L   Potassium 3.8 3.5 - 5.1 mmol/L   Chloride 111 98 - 111 mmol/L   CO2 19 (L) 22 - 32 mmol/L   Glucose, Bld 109 (H) 70 - 99 mg/dL    Comment: Glucose reference range applies only to samples taken after fasting for at least 8 hours.   BUN 86 (H) 8 - 23 mg/dL   Creatinine, Ser 2.03 (H) 0.61 - 1.24 mg/dL   Calcium 9.0 8.9 - 10.3 mg/dL   GFR, Estimated 32 (L) >60 mL/min    Comment: (NOTE) Calculated using the CKD-EPI Creatinine Equation (2021)    Anion gap 12 5 - 15    Comment: Performed at [facility] [address] [place], [place] [address]  CBC     Status: Abnormal   Collection Time: 08/10/20 12:59 AM  Result Value Ref Range   WBC 12.1 (H) 4.0 - 10.5 K/uL    RBC 2.92 (L) 4.22 - 5.81 MIL/uL   Hemoglobin 9.5 (L) 13.0 - 17.0 g/dL   HCT 27.4 (L) 39.0 - 52.0 %   MCV 93.8 80.0 - 100.0 fL   MCH 32.5 26.0 - 34.0 pg   MCHC 34.7 30.0 - 36.0 g/dL   RDW 16.9 (H) 11.5 - 15.5 %   Platelets 205 150 - 400 K/uL   nRBC 0.0 0.0 - 0.2 %    Comment: Performed at [facility], [place] [address], [place], [place] [address]  Glucose, capillary     Status: Abnormal   Collection Time: 08/10/20  6:33 AM  Result Value Ref Range   Glucose-Capillary 100 (H) 70 - 99 mg/dL    Comment: Glucose reference range applies only to samples taken after fasting for at least 8 hours.  Glucose, capillary     Status: Abnormal   Collection Time: 08/10/20 11:24 AM  Result Value Ref Range   Glucose-Capillary 126 (H) 70 - 99 mg/dL    Comment: Glucose reference range applies only to samples taken after fasting for at least 8 hours.  Sedimentation rate     Status: Abnormal   Collection Time: 08/10/20 11:50 AM  Result Value Ref Range   Sed Rate 55 (H) 0 - 16 mm/hr    Comment: Performed at [facility] [address] [place], [place] [address]  C-reactive protein     Status: Abnormal   Collection Time: 08/10/20 11:50 AM  Result Value Ref Range   CRP 26.4 (H) <1.0 mg/dL    Comment: Performed at [facility] [address] [place], [place] [address]  Glucose, capillary     Status: Abnormal   Collection Time: 08/10/20  4:43  PM  Result Value Ref Range   Glucose-Capillary 143 (H) 70 - 99 mg/dL    Comment: Glucose reference range applies only to samples taken after fasting for at least 8 hours.  Glucose, capillary     Status: Abnormal   Collection Time: 08/10/20  9:56 PM  Result Value Ref Range   Glucose-Capillary 103 (H) 70 - 99 mg/dL    Comment: Glucose reference range applies only to samples taken after fasting for at least 8 hours.    DG Tibia/Fibula Left  Result Date: 08/09/2020 CLINICAL DATA:  Cellulitis. EXAM: LEFT TIBIA AND FIBULA - 2 VIEW COMPARISON:  None.  FINDINGS: There is no evidence of fracture or other focal bone lesions. Diffuse soft tissue swelling of the left lower extremity. No soft tissue emphysema seen. Heavy vascular calcifications. IMPRESSION: 1. No acute fracture or dislocation identified about the left tibia/fibula. 2. Diffuse soft tissue swelling. Electronically Signed   By: Fidela Salisbury M.D.   On: 08/09/2020 17:27   CT HEAD WO CONTRAST  Result Date: 08/09/2020 CLINICAL DATA:  Altered mental status EXAM: CT HEAD WITHOUT CONTRAST TECHNIQUE: Contiguous axial images were obtained from the base of the skull through the vertex without intravenous contrast. COMPARISON:  July 18, 2020 FINDINGS: Evaluation is limited secondary to patient motion. 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 No definitive acute infarction within the limitations of this mildly motion degraded exam. No acute hemorrhage. Periventricular white matter hypodensities consistent with sequela of chronic microvascular ischemic disease. Vascular: Vascular calcifications. Skull: No acute fracture within the limitations of the exam. Sinuses/Orbits: Near complete opacification of the LEFT maxillary sinus with osseous sequela of chronic sinusitis. Internal calcifications and high density likely reflecting chronic fungal colonization. Other: None. IMPRESSION: 1. No definitive acute intracranial abnormality within the limitations of this mildly motion degraded exam. 2. 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 3. 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 Electronically Signed   By: Valentino Saxon MD   On: 08/09/2020 17:19   MR FOOT LEFT WO CONTRAST  Result Date: 08/10/2020 CLINICAL DATA:  Great toe osteomyelitis, long-term antibiotics. Chronic kidney disease. EXAM: MRI OF THE LEFT FOOT WITHOUT CONTRAST TECHNIQUE: Multiplanar, multisequence  MR imaging of the left forefoot was performed. No intravenous contrast was administered. COMPARISON:  Radiographs 08/09/2020 and MRI from 07/20/2020 FINDINGS: Bones/Joint/Cartilage Reduction in the edema signal in the middle and distal phalanges of the great toe likely reflecting improvement in osteomyelitis. No interphalangeal joint effusion. Stable medial erosion of the head of the proximal phalanx great toe, image 8 series 8. No other regions of significant abnormal osseous edema are identified in the forefoot. Ligaments Lisfranc ligament intact. Muscles and Tendons Atrophic regional musculature. Soft tissues Dorsal subcutaneous edema in the forefoot, similar to previous laterally but with some increased dorsal edema medially compared to previous. IMPRESSION: 1. Reduction in edema signal in the middle and distal phalanges of the great toe likely reflecting improvement in osteomyelitis. 2. Stable medial erosion of the head of the proximal phalanx great toe. 3. Dorsal subcutaneous edema in the forefoot, similar to previous laterally but with some increased dorsal edema medially compared to previous. Electronically Signed   By: Van Clines M.D.   On: 08/10/2020 08:00   DG Chest Port 1 View  Result Date: 08/09/2020 CLINICAL DATA:  Sepsis EXAM: PORTABLE CHEST 1 VIEW COMPARISON:  July 18, 2020 FINDINGS: The cardiomediastinal silhouette is unchanged  in contour when accounting for patient rotation.Atherosclerotic calcifications of the RIGHT carotid. No pleural effusion. No pneumothorax. No acute pleuroparenchymal abnormality. Visualized abdomen is unremarkable. Multilevel degenerative changes of the thoracic spine. IMPRESSION: No acute cardiopulmonary abnormality. Electronically Signed   By: Valentino Saxon MD   On: 08/09/2020 09:39   DG Foot 2 Views Left  Result Date: 08/09/2020 CLINICAL DATA:  Left great toe wound. EXAM: LEFT FOOT - 2 VIEW COMPARISON:  None. FINDINGS: There is no evidence of  fracture or dislocation. There is no evidence of cortical destructive changes. Left first digit tuft wound. Diffuse soft tissue swelling, particularly dorsally. IMPRESSION: 1. No acute fracture or dislocation identified about the left foot. 2. Left first digit tuft wound. 3. Diffuse soft tissue swelling, particularly dorsally. Electronically Signed   By: Fidela Salisbury M.D.   On: 08/09/2020 17:26   VAS [place] LOWER EXTREMITY VENOUS (DVT) (ONLY [facility] & [facility])  Result Date: 08/10/2020  Lower Venous DVT Study Indications: Edema.  Comparison Study: Prior negative Left lower extremity venous duplex done                   01/31/20 Performing Technologist: Sharion Dove RVS  Examination Guidelines: A complete evaluation includes B-mode imaging, spectral Doppler, color Doppler, and power Doppler as needed of all accessible portions of each vessel. Bilateral testing is considered an integral part of a complete examination. Limited examinations for reoccurring indications may be performed as noted. The reflux portion of the exam is performed with the patient in reverse Trendelenburg.  +-----+---------------+---------+-----------+----------+--------------+ RIGHTCompressibilityPhasicitySpontaneityPropertiesThrombus Aging +-----+---------------+---------+-----------+----------+--------------+ CFV  Full           Yes      Yes                                 +-----+---------------+---------+-----------+----------+--------------+   +---------+---------------+---------+-----------+----------+--------------+ LEFT     CompressibilityPhasicitySpontaneityPropertiesThrombus Aging +---------+---------------+---------+-----------+----------+--------------+ CFV      Full           Yes      Yes                                 +---------+---------------+---------+-----------+----------+--------------+ SFJ      Full                                                         +---------+---------------+---------+-----------+----------+--------------+ FV Prox  Full                                                        +---------+---------------+---------+-----------+----------+--------------+ FV Mid   Full                                                        +---------+---------------+---------+-----------+----------+--------------+ FV DistalFull                                                        +---------+---------------+---------+-----------+----------+--------------+  PFV      Full                                                        +---------+---------------+---------+-----------+----------+--------------+ POP      Full           Yes      Yes                                 +---------+---------------+---------+-----------+----------+--------------+ PTV      Full                                                        +---------+---------------+---------+-----------+----------+--------------+ PERO     Full                                                        +---------+---------------+---------+-----------+----------+--------------+     Summary: RIGHT: - No evidence of common femoral vein obstruction.  LEFT: - There is no evidence of deep vein thrombosis in the lower extremity.  - Ultrasound characteristics of enlarged lymph nodes noted in the groin. interstitial fluid noted throughout calf.  *See table(s) above for measurements and observations. Electronically signed by Servando Snare MD on 08/10/2020 at 5:59:30 PM.    Final     Review of Systems  Constitutional: Negative.  Negative for chills, fever and malaise/fatigue.  HENT: Negative.   Eyes: Negative.   Respiratory: Negative for cough and hemoptysis.   Cardiovascular: Positive for leg swelling. Negative for chest pain.  Gastrointestinal: Negative.   Genitourinary: Negative.   Musculoskeletal: Negative for myalgias.  Skin: Negative.   Neurological: Negative.    All other systems reviewed and are negative.  Blood pressure (!) 150/70, pulse 98, temperature 98.3 F (36.8 C), temperature source Oral, resp. rate 18, height $RemoveBe'6\' 1"'QWwEDjyoo$  (1.854 m), weight 127 kg, SpO2 98 %.  Vitals:   08/10/20 1637 08/10/20 1900  BP: 129/63 (!) 150/70  Pulse: 94 98  Resp: 20 18  Temp: 98.5 F (36.9 C) 98.3 F (36.8 C)  SpO2: 98% 98%    General AA&O x3. Normal mood and affect.  Vascular Dorsalis pedis and posterior tibial pulses  absent left  Capillary refill normal to all digits. Pedal hair growth diminished.  Neurologic Epicritic sensation grossly absent.  Dermatologic (Wound) Wound Location: Left hallux Wound Base: Mixed Granular/Fibrotic Peri-wound: Normal Exudate: None: wound tissue dry  Orthopedic: Motor intact BLE.    Assessment/Plan:  Left hallux ulcer, leg swelling, concern for sepsis -Imaging: Studies independently reviewed.  I did order a new ankle and foot x-ray, there is no evidence of Charcot arthropathy causing the leg swelling.  No effusion visible on his x-ray, currently no evidence of septic arthritis. -Discussed this case with Dr. Gale Journey from infectious disease, she thinks and I agree with her that this likely is more related to his peripheral vascular disease.  He is scheduled for angiography tomorrow with Dr. Trula Slade.  We will monitor his progress accordingly following revascularization.  I also discussed with her it will be beneficial for biopsy or culture of the bone to evaluate for persistent osteomyelitis.  This would likely not provide accurate results given his antibiotic suppression over the last few months.  If we do think this is cellulitis or infection from persistent recurrent osteomyelitis in the hallux, we could consider stopping his antibiotics and return as an outpatient for a biopsy. -If he does not improve significantly after his revascularization, we could consider an MRI or joint aspiration of the ankle joint to evaluate for septic  arthritis.  He did not have clinical findings of this today such as pain with micromotion, most of his pain and swelling is proximal to this in the leg and favor cellulitis. -DVT study is negative -Antibiotics: Per infectious disease -WB Status: WBAT left lower extremity -Wound Care: Silver alginate dressings every other day -Surgical Plan: No definite plans at this point, will follow his progress  [name] 08/10/2020, 10:02 PM   Best available via secure chat for questions or concerns.

## 2020-08-10 NOTE — Consult Note (Signed)
   [facility] Inpatient Consult   08/10/2020  KAGE WILLMANN 12-Jan-1936 [id number]   [place] [facility] [ACO] Patient: Medicare  Patient was screened for [place] [facility] services. Patient will have the transition of care call conducted by the primary care provider at [facility], this is an Embedded practice. Chronic Care Management is an Teacher, English as a foreign language.  Plan: Will continue to follow with Embedded Care Management and made aware of above needs.   Please contact for further questions,  Natividad Brood, RN BSN [facility] Liaison  [phone] business mobile phone Toll free office [phone]  Fax number: [phone] [email or web address]

## 2020-08-10 NOTE — Progress Notes (Addendum)
PROGRESS NOTE    Micheal Walls  KCL:[id number] DOB: Feb 02, 1936 DOA: 08/09/2020 PCP: [name], [name]    Brief Narrative:  Micheal Walls is a 85 year old male with past medical history significant for essential hypertension, CKD stage IIIb, chronic diastolic congestive heart failure, type 2 diabetes mellitus, history of recent CVA, recent left great toe osteomyelitis on oral antibiotics with Augmentin/doxycycline outpatient, diabetic neuropathy who presented to the ED with progressive left lower extremity pain, swelling, redness and confusion. Patient history was obtained by his brother who is present at bedside. Recently diagnosed with left great toe osteomyelitis and on oral antibiotics with doxycycline/Augmentin since 07/22/2020. Patient also follows with wound care outpatient. Brother concerned about new redness to the left shin and worsening pain.  In the ED, temperature 98.5 F, HR 111, RR 20, BP 115/55, SPO2 90% on 2 L nasal cannula. Sodium 140, potassium 3.9, chloride 108, CO2 17, glucose 175, BUN 104, creatinine 2.5, AST 28, ALT 20, total bilirubin 0.9. CK 362, high-sensitivity troponin 112>114. Lactic acid 4.1. WBC 9.9, hemoglobin 10.0, platelets 219. INR 1.2. Covid-19/influenza A/B PCR negative. Urinalysis unrevealing. CT head with no definitive acute intracranial normality with revisualization of sequela of remote prior infarction pons and bilateral cerebellum. Blood cultures x2 and urine culture: Pending. Patient referred by EDP for admission for further evaluation and management of confusion secondary to severe sepsis from left lower extremity cellulitis/great toe osteomyelitis.   Assessment & Plan:   Active Problems:   Sepsis ([place])   Severe sepsis, POA Left great toe osteomyelitis Patient presenting to the ED with progressive left lower extremity edema associated with pain and redness. Recently discharged with outpatient antibiotics for treatment of left great toe  osteomyelitis. Patient with elevated lactic acid of 4.1. X-ray left foot with no acute fracture or dislocation with notable left first digit tuft wound with diffuse soft tissue swelling. Left lower extremity vascular ultrasound negative for DVT. --ID/podiatry following, appreciate assistance --Lactic acid 4.1>2.8 --ESR: 55 (29 on 07/20/20) --CRP: 26.4 (21.8 on 07/20/20) --Blood cultures x2: Pending --Continue empiric antibiotics with Zyvox 654m IV q12h and cefepime 2g q12h --Vascular surgery evaluation pending as below for history of PVD and need for further evaluation/intervention --Follow CBC daily  Acute metabolic encephalopathy, POA Etiology likely related to acute infectious process as above as well as acute on chronic renal failure. CT head without contrast with no acute intracranial findings. Mentation now appears to be back to his normal baseline. --Supportive care  Acute renal failure on CKD stage IIIb Baseline creatinine 1.7. Creatinine on admission 2.80. Etiology likely secondary to underlying severe sepsis and volume depletion as above. --Cr 2.80>2.03 --LR at 749mhr --Avoid nephrotoxins, renal dose all medications --Follow BMP daily  Peripheral vascular disease Left lower extremity ABI on 07/19/2020 nonconclusive. Has been previously followed by vascular surgery, Dr. DiScot Dock--Vascular surgery consulted for further recommendations --Continue Plavix 75 mg p.o. daily --Aspirin 81 mg p.o. daily --Crestor 10 mg p.o. daily  Essential hypertension On furosemide 40 mg qAm and 8048mPM at home. TTE 07/19/2020 with LVEF 55-60%, moderate LVH, trivial MR and IVC dilated. --Hold home furosemide secondary to AKI as above --Hydralazine 81m13m q8h prn for SBP >170 or DBP >110 --Continue to monitor BP closely  Type 2 diabetes mellitus Home regimen includes semaglutide 1 mg subcutaneously weekly. A1c 6.1 on 07/19/2020, well controlled. --Insulin sliding scale for coverage --CBGs  QAC/HS  Peripheral neuropathy: Gabapentin 100 mg BID  GERD: Continue PPI   DVT prophylaxis: Heparin  Code Status: Full Code Family Communication: No family present at bedside  Disposition Plan:  Level of care: Progressive Status is: Inpatient  Remains inpatient appropriate because:Ongoing diagnostic testing needed not appropriate for outpatient work up, Unsafe d/c plan, IV treatments appropriate due to intensity of illness or inability to take PO and Inpatient level of care appropriate due to severity of illness   Dispo: The patient is from: Home              Anticipated d/c is to: Home              Patient currently is not medically stable to d/c.   Difficult to place patient No   Consultants:   Podiatry  Infectious disease, Dr. Gale Journey  Vascular surgery  Procedures:   None  Antimicrobials:   Zyvox 2/27  Cefepime 2/27>>  Metronidazole 2/27 - 2/27    Subjective: Patient seen and examined bedside, resting comfortably. Continues with left lower extremity swelling and pain, slightly improved since admission. Patient appears less confused and mentation now at baseline. No family present at bedside. No other complaints or concerns at this time. Denies headache, no visual changes, no chest pain, no palpitations, no shortness of breath, no abdominal pain, no weakness. No acute events overnight per nursing staff.  Objective: Vitals:   08/10/20 0317 08/10/20 0629 08/10/20 0839 08/10/20 1100  BP: (!) 106/52 (!) 106/51 (!) 104/51 (!) 121/54  Pulse: 92 60 87 87  Resp: 17 18 19 20   Temp: 99.1 F (37.3 C) 98.8 F (37.1 C) 98 F (36.7 C) 97.8 F (36.6 C)  TempSrc: Oral Oral Oral Oral  SpO2:  98% 99% 99%  Weight:      Height:        Intake/Output Summary (Last 24 hours) at 08/10/2020 1503 Last data filed at 08/10/2020 0300 Gross per 24 hour  Intake --  Output 1100 ml  Net -1100 ml   Filed Weights   08/09/20 0909  Weight: 127 kg    Examination:  General exam:  Appears calm and comfortable, chronically ill in appearance Respiratory system: Clear to auscultation. Respiratory effort normal. Oxygenating well on room air Cardiovascular system: S1 & S2 heard, RRR. No JVD, murmurs, rubs, gallops or clicks. 2+ pitting edema left lower extremity to mid shin  Gastrointestinal system: Abdomen is nondistended, soft and nontender. No organomegaly or masses felt. Normal bowel sounds heard. Central nervous system: Alert and oriented. No focal neurological deficits. Extremities: Noted right BKA, Skin: cellulitic changes left distal lower extremity that is tender to palpation, left great toe ulceration noted; see pictures below Psychiatry: Judgement and insight appear normal. Mood & affect appropriate.           Data Reviewed: I have personally reviewed following labs and imaging studies  CBC: Recent Labs  Lab 08/09/20 0858 08/09/20 0954 08/10/20 0059  WBC 9.9  --  12.1*  NEUTROABS 8.2*  --   --   HGB 10.0* 10.5* 9.5*  HCT 31.9* 31.0* 27.4*  MCV 98.2  --  93.8  PLT 219  --  350   Basic Metabolic Panel: Recent Labs  Lab 08/09/20 0858 08/09/20 0954 08/10/20 0059  NA 140 143 142  K 3.9 3.9 3.8  CL 108 108 111  CO2 17*  --  19*  GLUCOSE 175* 171* 109*  BUN 104* 102* 86*  CREATININE 2.85* 2.80* 2.03*  CALCIUM 9.6  --  9.0  MG 2.1  --   --    GFR:  Estimated Creatinine Clearance: 39.2 mL/min (A) (by C-G formula based on SCr of 2.03 mg/dL (H)). Liver Function Tests: Recent Labs  Lab 08/09/20 0858  AST 28  ALT 20  ALKPHOS 42  BILITOT 0.9  PROT 6.4*  ALBUMIN 3.1*   Recent Labs  Lab 08/09/20 0858  LIPASE 69*   No results for input(s): AMMONIA in the last 168 hours. Coagulation Profile: Recent Labs  Lab 08/09/20 0858  INR 1.2   Cardiac Enzymes: Recent Labs  Lab 08/09/20 0858  CKTOTAL 362   BNP (last 3 results) No results for input(s): PROBNP in the last 8760 hours. HbA1C: No results for input(s): HGBA1C in the last 72  hours. CBG: Recent Labs  Lab 08/09/20 2129 08/10/20 0633 08/10/20 1124  GLUCAP 103* 100* 126*   Lipid Profile: No results for input(s): CHOL, HDL, LDLCALC, TRIG, CHOLHDL, LDLDIRECT in the last 72 hours. Thyroid Function Tests: No results for input(s): TSH, T4TOTAL, FREET4, T3FREE, THYROIDAB in the last 72 hours. Anemia Panel: No results for input(s): VITAMINB12, FOLATE, FERRITIN, TIBC, IRON, RETICCTPCT in the last 72 hours. Sepsis Labs: Recent Labs  Lab 08/09/20 0858 08/09/20 1123  LATICACIDVEN 4.1* 2.8*    Recent Results (from the past 240 hour(s))  Resp Panel by RT-PCR (Flu A&B, Covid) Nasopharyngeal Swab     Status: None   Collection Time: 08/09/20  9:26 AM   Specimen: Nasopharyngeal Swab; Nasopharyngeal(NP) swabs in vial transport medium  Result Value Ref Range Status   SARS Coronavirus 2 by RT PCR NEGATIVE NEGATIVE Final    Comment: (NOTE) SARS-CoV-2 target nucleic acids are NOT DETECTED.  The SARS-CoV-2 RNA is generally detectable in upper respiratory specimens during the acute phase of infection. The lowest concentration of SARS-CoV-2 viral copies this assay can detect is 138 copies/mL. A negative result does not preclude SARS-Cov-2 infection and should not be used as the sole basis for treatment or other patient management decisions. A negative result may occur with  improper specimen collection/handling, submission of specimen other than nasopharyngeal swab, presence of viral mutation(s) within the areas targeted by this assay, and inadequate number of viral copies(<138 copies/mL). A negative result must be combined with clinical observations, patient history, and epidemiological information. The expected result is Negative.  Fact Sheet for Patients:  EntrepreneurPulse.com.au  Fact Sheet for Healthcare Providers:  IncredibleEmployment.be  This test is no t yet approved or cleared by the [place] FDA and  has been  authorized for detection and/or diagnosis of SARS-CoV-2 by FDA under an Emergency Use Authorization (EUA). This EUA will remain  in effect (meaning this test can be used) for the duration of the COVID-19 declaration under Section 564(b)(1) of the Act, 21 U.S.C.section 360bbb-3(b)(1), unless the authorization is terminated  or revoked sooner.       Influenza A by PCR NEGATIVE NEGATIVE Final   Influenza B by PCR NEGATIVE NEGATIVE Final    Comment: (NOTE) The Xpert Xpress SARS-CoV-2/FLU/RSV plus assay is intended as an aid in the diagnosis of influenza from Nasopharyngeal swab specimens and should not be used as a sole basis for treatment. Nasal washings and aspirates are unacceptable for Xpert Xpress SARS-CoV-2/FLU/RSV testing.  Fact Sheet for Patients: EntrepreneurPulse.com.au  Fact Sheet for Healthcare Providers: IncredibleEmployment.be  This test is not yet approved or cleared by the [place] FDA and has been authorized for detection and/or diagnosis of SARS-CoV-2 by FDA under an Emergency Use Authorization (EUA). This EUA will remain in effect (meaning this test can be used) for the  duration of the COVID-19 declaration under Section 564(b)(1) of the Act, 21 U.S.C. section 360bbb-3(b)(1), unless the authorization is terminated or revoked.  Performed at [facility], [place] [address], [place], [place] [address]   Blood Culture (routine x 2)     Status: None (Preliminary result)   Collection Time: 08/09/20  9:26 AM   Specimen: BLOOD  Result Value Ref Range Status   Specimen Description BLOOD RIGHT ANTECUBITAL  Final   Special Requests   Final    BOTTLES DRAWN AEROBIC AND ANAEROBIC Blood Culture adequate volume   Culture   Final    NO GROWTH 1 DAY Performed at [facility], [place] [address], [place], [place] [address]    Report Status PENDING  Incomplete  Blood Culture (routine x 2)     Status: None (Preliminary result)    Collection Time: 08/09/20  9:31 AM   Specimen: BLOOD  Result Value Ref Range Status   Specimen Description BLOOD RIGHT ANTECUBITAL  Final   Special Requests   Final    BOTTLES DRAWN AEROBIC AND ANAEROBIC Blood Culture adequate volume   Culture   Final    NO GROWTH 1 DAY Performed at [facility], [place] [name], [place], [place] [address]    Report Status PENDING  Incomplete  Urine culture     Status: None   Collection Time: 08/09/20 11:59 AM   Specimen: In/Out Cath Urine  Result Value Ref Range Status   Specimen Description IN/OUT CATH URINE  Final   Special Requests NONE  Final   Culture   Final    NO GROWTH Performed at [facility], [place] [address] [place], [place] [address]    Report Status 08/10/2020 FINAL  Final         Radiology Studies: DG Tibia/Fibula Left  Result Date: 08/09/2020 CLINICAL DATA:  Cellulitis. EXAM: LEFT TIBIA AND FIBULA - 2 VIEW COMPARISON:  None. FINDINGS: There is no evidence of fracture or other focal bone lesions. Diffuse soft tissue swelling of the left lower extremity. No soft tissue emphysema seen. Heavy vascular calcifications. IMPRESSION: 1. No acute fracture or dislocation identified about the left tibia/fibula. 2. Diffuse soft tissue swelling. Electronically Signed   By: Fidela Salisbury M.D.   On: 08/09/2020 17:27   CT HEAD WO CONTRAST  Result Date: 08/09/2020 CLINICAL DATA:  Altered mental status EXAM: CT HEAD WITHOUT CONTRAST TECHNIQUE: Contiguous axial images were obtained from the base of the skull through the vertex without intravenous contrast. COMPARISON:  July 18, 2020 FINDINGS: Evaluation is limited secondary to patient motion. 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 No definitive acute infarction within the limitations of this mildly motion degraded exam. No acute hemorrhage. Periventricular white matter hypodensities consistent with sequela of chronic  microvascular ischemic disease. Vascular: Vascular calcifications. Skull: No acute fracture within the limitations of the exam. Sinuses/Orbits: Near complete opacification of the LEFT maxillary sinus with osseous sequela of chronic sinusitis. Internal calcifications and high density likely reflecting chronic fungal colonization. Other: None. IMPRESSION: 1. No definitive acute intracranial abnormality within the limitations of this mildly motion degraded exam. 2. 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 3. 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 Electronically Signed   By: Valentino Saxon MD   On: 08/09/2020 17:19   MR FOOT LEFT WO CONTRAST  Result Date: 08/10/2020 CLINICAL DATA:  Great toe  osteomyelitis, long-term antibiotics. Chronic kidney disease. EXAM: MRI OF THE LEFT FOOT WITHOUT CONTRAST TECHNIQUE: Multiplanar, multisequence MR imaging of the left forefoot was performed. No intravenous contrast was administered. COMPARISON:  Radiographs 08/09/2020 and MRI from 07/20/2020 FINDINGS: Bones/Joint/Cartilage Reduction in the edema signal in the middle and distal phalanges of the great toe likely reflecting improvement in osteomyelitis. No interphalangeal joint effusion. Stable medial erosion of the head of the proximal phalanx great toe, image 8 series 8. No other regions of significant abnormal osseous edema are identified in the forefoot. Ligaments Lisfranc ligament intact. Muscles and Tendons Atrophic regional musculature. Soft tissues Dorsal subcutaneous edema in the forefoot, similar to previous laterally but with some increased dorsal edema medially compared to previous. IMPRESSION: 1. Reduction in edema signal in the middle and distal phalanges of the great toe likely reflecting improvement in osteomyelitis. 2. Stable medial erosion of the head of the proximal phalanx great toe. 3. Dorsal subcutaneous edema in the  forefoot, similar to previous laterally but with some increased dorsal edema medially compared to previous. Electronically Signed   By: Van Clines M.D.   On: 08/10/2020 08:00   DG Chest Port 1 View  Result Date: 08/09/2020 CLINICAL DATA:  Sepsis EXAM: PORTABLE CHEST 1 VIEW COMPARISON:  July 18, 2020 FINDINGS: The cardiomediastinal silhouette is unchanged in contour when accounting for patient rotation.Atherosclerotic calcifications of the RIGHT carotid. No pleural effusion. No pneumothorax. No acute pleuroparenchymal abnormality. Visualized abdomen is unremarkable. Multilevel degenerative changes of the thoracic spine. IMPRESSION: No acute cardiopulmonary abnormality. Electronically Signed   By: Valentino Saxon MD   On: 08/09/2020 09:39   DG Foot 2 Views Left  Result Date: 08/09/2020 CLINICAL DATA:  Left great toe wound. EXAM: LEFT FOOT - 2 VIEW COMPARISON:  None. FINDINGS: There is no evidence of fracture or dislocation. There is no evidence of cortical destructive changes. Left first digit tuft wound. Diffuse soft tissue swelling, particularly dorsally. IMPRESSION: 1. No acute fracture or dislocation identified about the left foot. 2. Left first digit tuft wound. 3. Diffuse soft tissue swelling, particularly dorsally. Electronically Signed   By: Fidela Salisbury M.D.   On: 08/09/2020 17:26   VAS [place] LOWER EXTREMITY VENOUS (DVT) (ONLY [facility] & [facility])  Result Date: 08/09/2020  Lower Venous DVT Study Indications: Edema.  Comparison Study: Prior negative Left lower extremity venous duplex done                   01/31/20 Performing Technologist: Sharion Dove RVS  Examination Guidelines: A complete evaluation includes B-mode imaging, spectral Doppler, color Doppler, and power Doppler as needed of all accessible portions of each vessel. Bilateral testing is considered an integral part of a complete examination. Limited examinations for reoccurring indications may be performed as noted. The reflux  portion of the exam is performed with the patient in reverse Trendelenburg.  +-----+---------------+---------+-----------+----------+--------------+ RIGHTCompressibilityPhasicitySpontaneityPropertiesThrombus Aging +-----+---------------+---------+-----------+----------+--------------+ CFV  Full           Yes      Yes                                 +-----+---------------+---------+-----------+----------+--------------+   +---------+---------------+---------+-----------+----------+--------------+ LEFT     CompressibilityPhasicitySpontaneityPropertiesThrombus Aging +---------+---------------+---------+-----------+----------+--------------+ CFV      Full           Yes      Yes                                 +---------+---------------+---------+-----------+----------+--------------+  SFJ      Full                                                        +---------+---------------+---------+-----------+----------+--------------+ FV Prox  Full                                                        +---------+---------------+---------+-----------+----------+--------------+ FV Mid   Full                                                        +---------+---------------+---------+-----------+----------+--------------+ FV DistalFull                                                        +---------+---------------+---------+-----------+----------+--------------+ PFV      Full                                                        +---------+---------------+---------+-----------+----------+--------------+ POP      Full           Yes      Yes                                 +---------+---------------+---------+-----------+----------+--------------+ PTV      Full                                                        +---------+---------------+---------+-----------+----------+--------------+ PERO     Full                                                         +---------+---------------+---------+-----------+----------+--------------+     Summary: RIGHT: - No evidence of common femoral vein obstruction.  LEFT: - There is no evidence of deep vein thrombosis in the lower extremity.  - Ultrasound characteristics of enlarged lymph nodes noted in the groin. interstitial fluid noted throughout calf.  *See table(s) above for measurements and observations.    Preliminary         Scheduled Meds: . aspirin EC  81 mg Oral Daily  . Chlorhexidine Gluconate Cloth  6 each Topical Daily  . clopidogrel  75 mg Oral Daily  . ferrous sulfate  325 mg Oral Q M,W,F  . gabapentin  100 mg Oral BID  .  heparin  5,000 Units Subcutaneous Q12H  . insulin aspart  0-9 Units Subcutaneous TID WC  . latanoprost  1 drop Both Eyes QHS  . pantoprazole  40 mg Oral QHS  . rosuvastatin  10 mg Oral Daily   Continuous Infusions: . ceFEPime (MAXIPIME) IV    . lactated ringers 75 mL/hr at 08/09/20 1840  . linezolid (ZYVOX) IV 600 mg (08/10/20 1005)     LOS: 1 day    Time spent: 41 minutes spent on chart review, discussion with nursing staff, consultants, updating family and interview/physical exam; more than 50% of that time was spent in counseling and/or coordination of care.    Saagar Tortorella J [place] DO [facility] Available via Epic secure chat 7am-7pm After these hours, please refer to coverage provider listed on [email or web address] 08/10/2020, 3:03 PM

## 2020-08-10 NOTE — Consult Note (Addendum)
Hospital Consult    Reason for Consult:  PAD Requesting Physician:  [place] MRN #:  [id number]  History of Present Illness: This is a 85 y.o. male who presented to the hospital lethargic and confused and left leg pain.  He was recently diagnosed with left big toe osteomyelitis and has been taking po abx including doxycycline and augmentin since 07/22/2020 and has been followed by wound care.  He was also having some left leg swelling and venous duplex was obtained, which revealed no evidence of DVT.    Pt has hx of HTN, CKD III, chronic diastolic CHF, DM, recent stroke, neuropathy.  He has a hx of GIB in 2012.  Pt was seen by Dr. Scot Dock in 2020 for left foot wound.  In April 2020, he had a healing venous ulcer of the left leg with underlying infrainguinal arterial occlusive disease.  He did have a toe pressure of 71mmHg and it was felt he had adequate circulation to heal the wound.  He would consider arteriogram with CO2 if the wound did not heal but fortunately, it did heal.  He was found to have CEAP C5 venous disease with moderate LLE swelling.  He discussed with him the importance of intermittent leg elevation and proper positioning.  He was given rx for 15--20 knee high compression socks.    He has hx of right BKA.  He states this was done in the 2000's and he did have a wound at that time.    [facility] is consulted for left toe osteomyelitis and PAD.  Pt states he has the wound on the left great toe that has been present for about 15 weeks.   He states that he wears his compression socks and does elevate his legs.  He does not have pain in his foot with elevation.  He does walk with a prosthesis and he does not have pain in his leg when he walks.  He also uses a wheelchair.  He denies fever but states he has had some chills.  The pt is on a statin for cholesterol management.  The pt is on a daily aspirin.   Other AC:  Plavix; sq heparin for DVT prophylaxis The pt is currently not on medication  for hypertension.   The pt is diabetic.   Tobacco hx:  Former  There is no family hx of AAA.  Past Medical History:  Diagnosis Date  . Acute metabolic encephalopathy 7/0/0174  . Amputated below knee ([place])    right  . Anemia   . Aspiration pneumonia ([place]) 11/02/2019  . Diastolic heart failure ([place])   . Diverticulosis   . DM (diabetes mellitus) ([place])   . Gout   . Hyperlipemia   . OSA on CPAP   . RBBB   . Sepsis ([place]) 11/02/2019  . Systemic hypertension     Past Surgical History:  Procedure Laterality Date  . LEG AMPUTATION BELOW KNEE     right  . NM MYOCAR PERF WALL MOTION  09/18/2009   no ischemia    Allergies  Allergen Reactions  . Vancomycin Rash    Prior to Admission medications   Medication Sig Start Date End Date Taking? Authorizing Provider  amoxicillin-clavulanate (AUGMENTIN) 875-125 MG tablet Take 1 tablet by mouth 2 (two) times daily. Started on 07-23-19 for 6 days. Unknown per family if he finished course of therapy.   Yes [provider]  aspirin EC 81 MG tablet Take 81 mg by mouth daily. Swallow whole.  Yes [provider]  Biotin 5000 MCG TABS Take 5,000 mcg by mouth daily.   Yes [provider]  [name] Apply between toes as needed for maceration. 08/08/20  Yes [name], DPM  clopidogrel (PLAVIX) 75 MG tablet Take 1 tablet (75 mg total) by mouth daily. 07/23/20 08/22/20 Yes Donne Hazel, MD  colchicine 0.6 MG tablet Take 0.6 mg by mouth daily as needed (gout flare up).   Yes [provider]  Continuous Blood Gluc Sensor (FREESTYLE LIBRE 14 DAY SENSOR) MISC Use as directed to check blood sugars 05/13/20  Yes [name], [name]  doxycycline (VIBRAMYCIN) 100 MG capsule Take 100 mg by mouth 2 (two) times daily. For 6 days. Patient was supposed to start on 07-25-19 however patient picked up RX on 08-04-19. Unknown how many doses have been took.   Yes [provider]  ferrous sulfate 325  (65 FE) MG tablet Take 325 mg by mouth every Monday, Wednesday, and Friday.   Yes [provider]  fish oil-omega-3 fatty acids 1000 MG capsule Take 2 g by mouth daily.   Yes [provider]  furosemide (LASIX) 40 MG tablet Take 2 tabs in morning and 1 tab evening Patient taking differently: Take 40-80 mg by mouth 2 (two) times daily. 80 mg in the morning and 40 mg at night 01/23/20  Yes [name], [name]  gabapentin (NEURONTIN) 100 MG capsule Take 1 capsule by mouth 3 times a day Patient taking differently: Take 100-200 mg by mouth 2 (two) times daily. 200 mg in the morning and 100 mg at bedtime. Total dose of 300 mg 07/15/20  Yes [name], [name]  glucose blood (FREESTYLE PRECISION NEO TEST) test strip Use as instructed 07/30/19  Yes [name], [name]  hydrocortisone (ANUSOL-HC) 25 MG suppository Place 25 mg rectally 2 (two) times daily as needed for hemorrhoids or anal itching.   Yes [provider]  latanoprost (XALATAN) 0.005 % ophthalmic solution Place 1 drop into both eyes at bedtime.  10/12/18  Yes [provider]  Multiple Vitamin (MULTIVITAMIN WITH MINERALS) TABS tablet Take 1 tablet by mouth daily.   Yes [provider]  pantoprazole (PROTONIX) 40 MG tablet Take 1 tablet  by mouth daily. Patient taking differently: Take 40 mg by mouth at bedtime. 07/15/20  Yes [name], [name]  Polyvinyl Alcohol-Povidone (REFRESH OP) Place 1 drop into both eyes daily as needed (dry eyes).   Yes [provider]  PRESCRIPTION MEDICATION Inhale into the lungs See admin instructions. CPAP- At bedtime   Yes [provider]  Semaglutide, [name], (OZEMPIC, [name],) 2 MG/1.5ML SOPN Inject 1 mg into the skin once a week. Patient taking differently: Inject 1 mg into the skin every Wednesday. 09/24/19  Yes [name], [name]  rosuvastatin (CRESTOR) 10 MG tablet Take 1 tablet (10 mg total) by mouth daily. Patient taking differently: Take 10 mg by mouth  every 7 (seven) days. Wednesday of each week 11/07/19   Bonnielee Haff, MD    Social History   Socioeconomic History  . Marital status: Married    Spouse name: Not on file  . Number of children: Not on file  . Years of education: Not on file  . Highest education level: Not on file  Occupational History  . Occupation: retired  Tobacco Use  . Smoking status: Former Smoker    Types: Cigars    Quit date: 06/12/2001    Years since  quitting: 19.1  . Smokeless tobacco: Never Used  Vaping Use  . Vaping Use: Former  Substance and Sexual Activity  . Alcohol use: No    Alcohol/week: 0.0 standard drinks  . Drug use: No  . Sexual activity: Not Currently  Other Topics Concern  . Not on file  Social History Narrative  . Not on file   Social Determinants of Health   Financial Resource Strain: Low Risk   . Difficulty of Paying Living Expenses: Not hard at all  Food Insecurity: No Food Insecurity  . Worried About Charity fundraiser in the Last Year: Never true  . Ran Out of Food in the Last Year: Never true  Transportation Needs: No Transportation Needs  . Lack of Transportation (Medical): No  . Lack of Transportation (Non-Medical): No  Physical Activity: Inactive  . Days of Exercise per Week: 0 days  . Minutes of Exercise per Session: 0 min  Stress: No Stress Concern Present  . Feeling of Stress : Not at all  Social Connections: Not on file  Intimate Partner Violence: Not on file     Family History  Problem Relation Age of Onset  . Diabetes Mother   . Heart attack Father   . Cancer Sister     ROS: [x]  Positive   [ ]  Negative   [ ]  All sytems reviewed and are negative  Cardiac: [x]  heart failure  Vascular: []  pain in legs while walking []  pain in legs at rest []  pain in legs at night [x]  non-healing ulcers []  hx of DVT [x]  swelling in legs  Pulmonary: []  asthma/wheezing []  home O2  Neurologic: []  hx of CVA []  mini stroke   Hematologic: []  hx of  cancer  Endocrine:   [x]  diabetes []  thyroid disease  GI [x]  hx GIB   GU: [x]  CKD/renal failure []  HD--[]  M/W/F or []  T/T/S  Psychiatric: []  anxiety []  depression  Musculoskeletal: []  arthritis []  joint pain  Integumentary: []  rashes []  ulcers  Constitutional: []  fever  [x]  chills  Physical Examination  Vitals:   08/10/20 0839 08/10/20 1100  BP: (!) 104/51 (!) 121/54  Pulse: 87 87  Resp: 19 20  Temp: 98 F (36.7 C) 97.8 F (36.6 C)  SpO2: 99% 99%   Body mass index is 36.94 kg/m.  General:  WDWN in NAD Gait: Not observed HENT: WNL, normocephalic Pulmonary: normal non-labored breathing Cardiac: regular, without Murmur; without carotid bruits Abdomen: obese, soft, NT/ND; aortic pulse is not palpable Skin: without rashes Vascular Exam/Pulses:  Right Left  Radial 2+ (normal) 2+ (normal)  Femoral Unable to palpate due to body habitus  Unable to palpate due to body habitus  DP BKA monophasic  PT BKA absent  Peroneal BKA monophasic   Extremities: LLE swelling; left great toe wound       Musculoskeletal: no muscle wasting or atrophy  Neurologic: A&O X 3; speech is fluent/normal Psychiatric:  The pt has Normal affect.  CBC    Component Value Date/Time   WBC 12.1 (H) 08/10/2020 0059   RBC 2.92 (L) 08/10/2020 0059   HGB 9.5 (L) 08/10/2020 0059   HGB 10.8 (L) 01/23/2020 1454   HCT 27.4 (L) 08/10/2020 0059   HCT 32.8 (L) 01/23/2020 1454   PLT 205 08/10/2020 0059   PLT 146 (L) 01/23/2020 1454   MCV 93.8 08/10/2020 0059   MCV 91 01/23/2020 1454   MCH 32.5 08/10/2020 0059   MCHC 34.7 08/10/2020 0059   RDW 16.9 (H)  08/10/2020 0059   RDW 14.3 01/23/2020 1454   LYMPHSABS 0.6 (L) 08/09/2020 0858   LYMPHSABS 1.8 03/15/2018 1022   MONOABS 1.0 08/09/2020 0858   EOSABS 0.0 08/09/2020 0858   EOSABS 0.2 03/15/2018 1022   BASOSABS 0.0 08/09/2020 0858   BASOSABS 0.0 03/15/2018 1022    BMET    Component Value Date/Time   NA 142 08/10/2020 0059   NA  140 05/20/2020 0951   K 3.8 08/10/2020 0059   CL 111 08/10/2020 0059   CO2 19 (L) 08/10/2020 0059   GLUCOSE 109 (H) 08/10/2020 0059   BUN 86 (H) 08/10/2020 0059   BUN 50 (H) 05/20/2020 0951   CREATININE 2.03 (H) 08/10/2020 0059   CALCIUM 9.0 08/10/2020 0059   GFRNONAA 32 (L) 08/10/2020 0059   GFRAA 42 (L) 05/20/2020 0951    COAGS: Lab Results  Component Value Date   INR 1.2 08/09/2020   INR 1.2 07/18/2020   INR 1.1 01/17/2020     Non-Invasive Vascular Imaging:   LLE venous duplex 08/09/2020: RIGHT:  - No evidence of common femoral vein obstruction.  LEFT:  - There is no evidence of deep vein thrombosis in the lower extremity.    - Ultrasound characteristics of enlarged lymph nodes noted in the groin.  interstitial fluid noted throughout calf.  ABI 07/19/2020: Left is [place]  Carotid duplex 07/19/2020 1-39% bilateral ICA stenosis   ASSESSMENT/PLAN: This is a 85 y.o. male with left great toe wound   -pt has hx of mixed PAD with venous disease.  He does not have rest pain or claudication.   His doppler signals are monophasic in the left DP/peroneal.  He has had this wound for about 15 weeks.  He has known renal insufficiency and his creatinine is 2.03.   He was seen by Dr. Scot Dock in 2020 for a venous wound that was non healing but this did subsequently heal.  He had discussed doing arteriogram with CO2, which he is probably going to need for this toe wound.  Dr. Scot Dock had also prescribed 15-20 mmHg knee high compression, which he wears during the day and elevates his leg.   -discussed padding his foot so that he does not have pressure on it from the foot of the bed.    Dr. Donzetta Matters will be by to evaluate the pt later today.  Continue asa/statin/plavix   Leontine Locket, PA-C [facility] [phone]  I have independently interviewed and examined patient and agree with PA assessment and plan above.  Easily palpable left popliteal pulse consistent with  previous duplex suggesting tibial disease.  Plan for angiography and will need CO2 tomorrow.  N.p.o. past midnight  Richele Strand C. Donzetta Matters, MD [facility] [place] Office: [phone] Pager: [phone]

## 2020-08-10 NOTE — Progress Notes (Signed)
Pharmacy Antibiotic Note  Micheal Walls is a 85 y.o. male admitted on 08/09/2020 with L foot osteomyelitis .  Pharmacy has been consulted for Cefepime dosing. -WBC= 12.1, afebrile, SCr= 2.0, CrCL ~ 40 -cultures- ngtd   Height: $Remove'6\' 1"'yYwvkGa$  (185.4 cm) Weight: 127 kg (280 lb) IBW/kg (Calculated) : 79.9  Temp (24hrs), Avg:98.6 F (37 C), Min:98 F (36.7 C), Max:99.1 F (37.3 C)  Recent Labs  Lab 08/09/20 0858 08/09/20 0954 08/09/20 1123 08/10/20 0059  WBC 9.9  --   --  12.1*  CREATININE 2.85* 2.80*  --  2.03*  LATICACIDVEN 4.1*  --  2.8*  --     Estimated Creatinine Clearance: 39.2 mL/min (A) (by C-G formula based on SCr of 2.03 mg/dL (H)).    Allergies  Allergen Reactions  . Vancomycin Rash    Antimicrobials this admission: 2/27 Cefepime >>  2/27 Zyvox >>  Dose adjustments this admission: N/a  Microbiology results: 2/27 urine - neg 2/27 blood x2   Plan:  - Change Cefepime to 2g IV q12h - Monitor patients renal function and urine output  - De-escalate ABX when appropriate   Thank you for allowing pharmacy to be a part of this patient's care.  Hildred Laser, PharmD Clinical Pharmacist **Pharmacist phone directory can now be found on [email or web address] ([email or web address]).  Listed under [place].

## 2020-08-11 ENCOUNTER — Ambulatory Visit (HOSPITAL_COMMUNITY): Admit: 2020-08-11 | Payer: Medicare Other | Admitting: Surgery

## 2020-08-11 ENCOUNTER — Encounter (HOSPITAL_BASED_OUTPATIENT_CLINIC_OR_DEPARTMENT_OTHER): Payer: Medicare Other | Admitting: Internal Medicine

## 2020-08-11 ENCOUNTER — Encounter (HOSPITAL_COMMUNITY)
Admission: EM | Disposition: A | Discharge status: Home Health | Payer: Self-pay | Source: Home / Self Care | Attending: Internal Medicine

## 2020-08-11 DIAGNOSIS — L03116 Cellulitis of left lower limb: Secondary | ICD-10-CM | POA: Diagnosis not present

## 2020-08-11 DIAGNOSIS — R6520 Severe sepsis without septic shock: Secondary | ICD-10-CM | POA: Diagnosis not present

## 2020-08-11 DIAGNOSIS — M869 Osteomyelitis, unspecified: Secondary | ICD-10-CM | POA: Diagnosis not present

## 2020-08-11 DIAGNOSIS — A419 Sepsis, unspecified organism: Secondary | ICD-10-CM | POA: Diagnosis not present

## 2020-08-11 DIAGNOSIS — M25472 Effusion, left ankle: Secondary | ICD-10-CM | POA: Diagnosis not present

## 2020-08-11 HISTORY — PX: PERIPHERAL VASCULAR BALLOON ANGIOPLASTY: CATH118281

## 2020-08-11 HISTORY — PX: ABDOMINAL AORTOGRAM W/LOWER EXTREMITY: CATH118223

## 2020-08-11 LAB — CBC
HCT: 27 % — ABNORMAL LOW (ref 39.0–52.0)
Hemoglobin: 9.4 g/dL — ABNORMAL LOW (ref 13.0–17.0)
MCH: 32 pg (ref 26.0–34.0)
MCHC: 34.8 g/dL (ref 30.0–36.0)
MCV: 91.8 fL (ref 80.0–100.0)
Platelets: 171 10*3/uL (ref 150–400)
RBC: 2.94 MIL/uL — ABNORMAL LOW (ref 4.22–5.81)
RDW: 17 % — ABNORMAL HIGH (ref 11.5–15.5)
WBC: 13 10*3/uL — ABNORMAL HIGH (ref 4.0–10.5)
nRBC: 0 % (ref 0.0–0.2)

## 2020-08-11 LAB — SURGICAL PCR SCREEN
MRSA, PCR: NEGATIVE
Staphylococcus aureus: NEGATIVE

## 2020-08-11 LAB — BASIC METABOLIC PANEL
Anion gap: 7 (ref 5–15)
BUN: 66 mg/dL — ABNORMAL HIGH (ref 8–23)
CO2: 22 mmol/L (ref 22–32)
Calcium: 9.2 mg/dL (ref 8.9–10.3)
Chloride: 112 mmol/L — ABNORMAL HIGH (ref 98–111)
Creatinine, Ser: 1.8 mg/dL — ABNORMAL HIGH (ref 0.61–1.24)
GFR, Estimated: 37 mL/min — ABNORMAL LOW (ref >60)
Glucose, Bld: 109 mg/dL — ABNORMAL HIGH (ref 70–99)
Potassium: 3.9 mmol/L (ref 3.5–5.1)
Sodium: 141 mmol/L (ref 135–145)

## 2020-08-11 LAB — GLUCOSE, CAPILLARY
Glucose-Capillary: 110 mg/dL — ABNORMAL HIGH (ref 70–99)
Glucose-Capillary: 110 mg/dL — ABNORMAL HIGH (ref 70–99)
Glucose-Capillary: 89 mg/dL (ref 70–99)
Glucose-Capillary: 178 mg/dL — ABNORMAL HIGH (ref 70–99)

## 2020-08-11 LAB — LACTIC ACID, PLASMA: Lactic Acid, Venous: 1.2 mmol/L (ref 0.5–1.9)

## 2020-08-11 LAB — MAGNESIUM: Magnesium: 2.2 mg/dL (ref 1.7–2.4)

## 2020-08-11 SURGERY — ABDOMINAL AORTOGRAM W/LOWER EXTREMITY
Anesthesia: LOCAL | Laterality: Left

## 2020-08-11 MED ORDER — SODIUM CHLORIDE 0.9 % IV SOLN: 250.0000 mL | INTRAVENOUS | Status: DC | PRN

## 2020-08-11 MED ORDER — SODIUM CHLORIDE 0.9% FLUSH
3.0000 mL | Freq: Two times a day (BID) | INTRAVENOUS | Status: DC
  Administered 2020-08-12 – 2020-08-13 (×3): 3 mL via INTRAVENOUS

## 2020-08-11 MED ORDER — MIDAZOLAM HCL 5 MG/5ML IJ SOLN
INTRAMUSCULAR | Status: AC
  Filled 2020-08-11: qty 5

## 2020-08-11 MED ORDER — IODIXANOL 320 MG/ML IV SOLN
INTRAVENOUS | Status: DC | PRN
  Administered 2020-08-11: 21 mL

## 2020-08-11 MED ORDER — HEPARIN (PORCINE) IN NACL 1000-0.9 UT/500ML-% IV SOLN
INTRAVENOUS | Status: AC
  Filled 2020-08-11: qty 1000

## 2020-08-11 MED ORDER — SODIUM CHLORIDE 0.9 % IV SOLN: INTRAVENOUS | Status: DC

## 2020-08-11 MED ORDER — LABETALOL HCL 5 MG/ML IV SOLN: 10.0000 mg | INTRAVENOUS | Status: DC | PRN

## 2020-08-11 MED ORDER — MIDAZOLAM HCL 2 MG/2ML IJ SOLN
INTRAMUSCULAR | Status: DC | PRN
  Administered 2020-08-11: 1 mg via INTRAVENOUS

## 2020-08-11 MED ORDER — FENTANYL CITRATE (PF) 100 MCG/2ML IJ SOLN
INTRAMUSCULAR | Status: DC | PRN
  Administered 2020-08-11: 50 ug via INTRAVENOUS

## 2020-08-11 MED ORDER — HEPARIN (PORCINE) IN NACL 1000-0.9 UT/500ML-% IV SOLN
INTRAVENOUS | Status: DC | PRN
  Administered 2020-08-11 (×2): 500 mL

## 2020-08-11 MED ORDER — ACETAMINOPHEN 325 MG PO TABS: 650.0000 mg | ORAL_TABLET | ORAL | Status: DC | PRN

## 2020-08-11 MED ORDER — LIDOCAINE HCL (PF) 1 % IJ SOLN
INTRAMUSCULAR | Status: AC
  Filled 2020-08-11: qty 30

## 2020-08-11 MED ORDER — LIDOCAINE HCL (PF) 1 % IJ SOLN
INTRAMUSCULAR | Status: DC | PRN
  Administered 2020-08-11: 15 mL

## 2020-08-11 MED ORDER — SODIUM CHLORIDE 0.9% FLUSH: 3.0000 mL | INTRAVENOUS | Status: DC | PRN

## 2020-08-11 MED ORDER — FENTANYL CITRATE (PF) 100 MCG/2ML IJ SOLN
INTRAMUSCULAR | Status: AC
  Filled 2020-08-11: qty 2

## 2020-08-11 MED ORDER — HEPARIN SODIUM (PORCINE) 1000 UNIT/ML IJ SOLN
INTRAMUSCULAR | Status: AC
  Filled 2020-08-11: qty 1

## 2020-08-11 MED ORDER — ONDANSETRON HCL 4 MG/2ML IJ SOLN: 4.0000 mg | Freq: Four times a day (QID) | INTRAMUSCULAR | Status: DC | PRN

## 2020-08-11 MED ORDER — HYDRALAZINE HCL 20 MG/ML IJ SOLN: 5.0000 mg | INTRAMUSCULAR | Status: DC | PRN

## 2020-08-11 MED ORDER — HEPARIN SODIUM (PORCINE) 1000 UNIT/ML IJ SOLN
INTRAMUSCULAR | Status: DC | PRN
  Administered 2020-08-11: 10000 [IU] via INTRAVENOUS

## 2020-08-11 MED ORDER — LINEZOLID 600 MG PO TABS
600.0000 mg | ORAL_TABLET | Freq: Two times a day (BID) | ORAL | Status: DC
  Administered 2020-08-11 – 2020-08-13 (×4): 600 mg via ORAL
  Filled 2020-08-11 (×6): qty 1

## 2020-08-11 MED ORDER — AMOXICILLIN-POT CLAVULANATE 875-125 MG PO TABS
1.0000 | ORAL_TABLET | Freq: Two times a day (BID) | ORAL | Status: DC
  Administered 2020-08-11 – 2020-08-13 (×4): 1 via ORAL
  Filled 2020-08-11 (×4): qty 1

## 2020-08-11 MED ORDER — SODIUM CHLORIDE 0.9 % WEIGHT BASED INFUSION: 1.0000 mL/kg/h | INTRAVENOUS | Status: DC

## 2020-08-11 SURGICAL SUPPLY — 25 items
CATH MUSTANG 3X60X135 (BALLOONS) ×2 IMPLANT
CATH OMNI FLUSH 5F 65CM (CATHETERS) ×2 IMPLANT
CATH TEMPO AQUA 5F 100CM (CATHETERS) ×2 IMPLANT
CLOSURE MYNX CONTROL 6F/7F (Vascular Products) ×2 IMPLANT
FILTER CO2 0.2 MICRON (VASCULAR PRODUCTS) ×2 IMPLANT
GLIDEWIRE ADV .035X260CM (WIRE) ×2 IMPLANT
GLIDEWIRE NITREX 0.018X80X5 (WIRE) ×2
GUIDEWIRE NITREX 0.018X80X5 (WIRE) ×1 IMPLANT
KIT ENCORE 26 ADVANTAGE (KITS) ×2 IMPLANT
KIT MICROPUNCTURE NIT STIFF (SHEATH) ×2 IMPLANT
KIT PV (KITS) ×2 IMPLANT
MAT PREVALON FULL STRYKER (MISCELLANEOUS) ×2 IMPLANT
RESERVOIR CO2 (VASCULAR PRODUCTS) ×2 IMPLANT
SET FLUSH CO2 (MISCELLANEOUS) ×2 IMPLANT
SHEATH HIGHFLEX ANSEL 6FRX55 (SHEATH) ×2 IMPLANT
SHEATH PINNACLE 5F 10CM (SHEATH) ×2 IMPLANT
SHEATH PINNACLE 6F 10CM (SHEATH) ×2 IMPLANT
SHEATH PROBE COVER 6X72 (BAG) ×2 IMPLANT
STOPCOCK MORSE 400PSI 3WAY (MISCELLANEOUS) ×2 IMPLANT
SYR MEDRAD MARK 7 150ML (SYRINGE) ×2 IMPLANT
TRANSDUCER W/STOPCOCK (MISCELLANEOUS) ×2 IMPLANT
TRAY PV CATH (CUSTOM PROCEDURE TRAY) ×2 IMPLANT
TUBING CIL FLEX 10 FLL-RA (TUBING) ×2 IMPLANT
WIRE G V18X300CM (WIRE) ×2 IMPLANT
WIRE STARTER BENTSON 035X150 (WIRE) ×2 IMPLANT

## 2020-08-11 NOTE — Progress Notes (Signed)
PROGRESS NOTE    Micheal Walls  QBH:[id number] DOB: [birth year] DOA: 08/09/2020 PCP: [name], [name]    Brief Narrative:  Micheal Walls is a 85 year old male with past medical history significant for essential hypertension, CKD stage IIIb, chronic diastolic congestive heart failure, type 2 diabetes mellitus, history of recent CVA, recent left great toe osteomyelitis on oral antibiotics with Augmentin/doxycycline outpatient, diabetic neuropathy who presented to the ED with progressive left lower extremity pain, swelling, redness and confusion. Patient history was obtained by his brother who is present at bedside. Recently diagnosed with left great toe osteomyelitis and on oral antibiotics with doxycycline/Augmentin since 07/22/2020. Patient also follows with wound care outpatient. Brother concerned about new redness to the left shin and worsening pain.  In the ED, temperature 98.5 F, HR 111, RR 20, BP 115/55, SPO2 90% on 2 L nasal cannula. Sodium 140, potassium 3.9, chloride 108, CO2 17, glucose 175, BUN 104, creatinine 2.5, AST 28, ALT 20, total bilirubin 0.9. CK 362, high-sensitivity troponin 112>114. Lactic acid 4.1. WBC 9.9, hemoglobin 10.0, platelets 219. INR 1.2. Covid-19/influenza A/B PCR negative. Urinalysis unrevealing. CT head with no definitive acute intracranial normality with revisualization of sequela of remote prior infarction pons and bilateral cerebellum. Blood cultures x2 and urine culture: Pending. Patient referred by EDP for admission for further evaluation and management of confusion secondary to severe sepsis from left lower extremity cellulitis/great toe osteomyelitis.   Assessment & Plan:   Principal Problem:   Severe sepsis ([place]) Active Problems:   S/P unilateral BKA (below knee amputation) (HCC)   Mixed hyperlipidemia   Essential hypertension   Anemia, iron deficiency   CKD (chronic kidney disease) stage 4, GFR 15-29 ml/min (HCC)   History of stroke   Left  hallux osteomyelitis (HCC)   Left ankle swelling   Severe sepsis, POA Left great toe osteomyelitis Patient presenting to the ED with progressive left lower extremity edema associated with pain and redness. Recently discharged with outpatient antibiotics for treatment of left great toe osteomyelitis. Patient with elevated lactic acid of 4.1. X-ray left foot with no acute fracture or dislocation with notable left first digit tuft wound with diffuse soft tissue swelling. Left lower extremity vascular ultrasound negative for DVT. --ID/podiatry/vascular surgery following, appreciate assistance --Lactic acid 4.1>2.8>1.2 --ESR: 55 (29 on 07/20/20) --CRP: 26.4 (21.8 on 07/20/20) --Blood cultures x2: No growth x1 day --Continue empiric antibiotics with Zyvox 610m IV q12h and cefepime 2g q12h --Vascular surgery plans abdominal aortogram with lower extremity today  Acute metabolic encephalopathy, POA Etiology likely related to acute infectious process as above as well as acute on chronic renal failure. CT head without contrast with no acute intracranial findings. Mentation now appears to be back to his normal baseline. --Supportive care  Acute renal failure on CKD stage IIIb Baseline creatinine 1.7. Creatinine on admission 2.80. Etiology likely secondary to underlying severe sepsis and volume depletion as above. --Cr 2.80>2.03>1.80 --Avoid nephrotoxins, renal dose all medications --Follow BMP daily  Peripheral vascular disease Left lower extremity ABI on 07/19/2020 nonconclusive. Has been previously followed by vascular surgery, Dr. DScot Dock --Vascular following w/ planned abdominal aortogram with lower extremity today --Continue Plavix 75 mg p.o. daily --Aspirin 81 mg p.o. daily --Crestor 10 mg p.o. daily  Essential hypertension On furosemide 40 mg qAm and 858mqPM at home. TTE 07/19/2020 with LVEF 55-60%, moderate LVH, trivial MR and IVC dilated. --Hold home furosemide secondary to AKI as  above --Hydralazine 2533mO q8h prn for SBP >170 or DBP >  110 --Continue to monitor BP closely  Type 2 diabetes mellitus Home regimen includes semaglutide 1 mg subcutaneously weekly. A1c 6.1 on 07/19/2020, well controlled. --Insulin sliding scale for coverage --CBGs QAC/HS  Peripheral neuropathy: Gabapentin 100 mg BID  GERD: Continue PPI   DVT prophylaxis: Heparin   Code Status: Full Code Family Communication: Updated patient extensively at bedside, attempted to update patient's daughter [name] via telephone, went straight to voicemail.   Disposition Plan:  Level of care: Telemetry Medical Status is: Inpatient  Remains inpatient appropriate because:Ongoing diagnostic testing needed not appropriate for outpatient work up, Unsafe d/c plan, IV treatments appropriate due to intensity of illness or inability to take PO and Inpatient level of care appropriate due to severity of illness   Dispo: The patient is from: Home              Anticipated d/c is to: Home              Patient currently is not medically stable to d/c.   Difficult to place patient No   Consultants:   Podiatry  Infectious disease, Dr. Gale Journey  Vascular surgery  Procedures:   None  Antimicrobials:   Zyvox 2/27  Cefepime 2/27>>  Metronidazole 2/27 - 2/27    Subjective: Patient seen and examined bedside, resting comfortably. Awaiting abdominal aortogram planned by vascular surgery later this morning. No other questions or concerns at this time. Denies headache, no visual changes, no chest pain, no palpitations, no shortness of breath, no abdominal pain, no weakness. No acute events overnight per nursing staff.  Objective: Vitals:   08/11/20 0700 08/11/20 0815 08/11/20 0900 08/11/20 1100  BP: 103/87 116/62  (!) 159/77  Pulse: 88   90  Resp: 20 (!) 26 20 20   Temp: 98 F (36.7 C) 99 F (37.2 C)    TempSrc: Oral Oral  Oral  SpO2: 99% 100%  97%  Weight:      Height:        Intake/Output Summary  (Last 24 hours) at 08/11/2020 1249 Last data filed at 08/11/2020 0651 Gross per 24 hour  Intake --  Output 3850 ml  Net -3850 ml   Filed Weights   08/09/20 0909  Weight: 127 kg    Examination:  General exam: Appears calm and comfortable, chronically ill in appearance Respiratory system: Clear to auscultation. Respiratory effort normal. Oxygenating well on room air Cardiovascular system: S1 & S2 heard, RRR. No JVD, murmurs, rubs, gallops or clicks. 2+ pitting edema left lower extremity to mid shin  Gastrointestinal system: Abdomen is nondistended, soft and nontender. No organomegaly or masses felt. Normal bowel sounds heard. Central nervous system: Alert and oriented. No focal neurological deficits. Extremities: Noted right BKA, Skin: cellulitic changes left distal lower extremity that is tender to palpation, left great toe ulceration noted; see pictures below Psychiatry: Judgement and insight appear normal. Mood & affect appropriate.           Data Reviewed: I have personally reviewed following labs and imaging studies  CBC: Recent Labs  Lab 08/09/20 0858 08/09/20 0954 08/10/20 0059 08/11/20 0130  WBC 9.9  --  12.1* 13.0*  NEUTROABS 8.2*  --   --   --   HGB 10.0* 10.5* 9.5* 9.4*  HCT 31.9* 31.0* 27.4* 27.0*  MCV 98.2  --  93.8 91.8  PLT 219  --  205 655   Basic Metabolic Panel: Recent Labs  Lab 08/09/20 0858 08/09/20 0954 08/10/20 0059 08/11/20 0130  NA 140 143  142 141  K 3.9 3.9 3.8 3.9  CL 108 108 111 112*  CO2 17*  --  19* 22  GLUCOSE 175* 171* 109* 109*  BUN 104* 102* 86* 66*  CREATININE 2.85* 2.80* 2.03* 1.80*  CALCIUM 9.6  --  9.0 9.2  MG 2.1  --   --  2.2   GFR: Estimated Creatinine Clearance: 44.2 mL/min (A) (by C-G formula based on SCr of 1.8 mg/dL (H)). Liver Function Tests: Recent Labs  Lab 08/09/20 0858  AST 28  ALT 20  ALKPHOS 42  BILITOT 0.9  PROT 6.4*  ALBUMIN 3.1*   Recent Labs  Lab 08/09/20 0858  LIPASE 69*   No results  for input(s): AMMONIA in the last 168 hours. Coagulation Profile: Recent Labs  Lab 08/09/20 0858  INR 1.2   Cardiac Enzymes: Recent Labs  Lab 08/09/20 0858  CKTOTAL 362   BNP (last 3 results) No results for input(s): PROBNP in the last 8760 hours. HbA1C: No results for input(s): HGBA1C in the last 72 hours. CBG: Recent Labs  Lab 08/10/20 1124 08/10/20 1643 08/10/20 2156 08/11/20 0614 08/11/20 1140  GLUCAP 126* 143* 103* 110* 110*   Lipid Profile: No results for input(s): CHOL, HDL, LDLCALC, TRIG, CHOLHDL, LDLDIRECT in the last 72 hours. Thyroid Function Tests: No results for input(s): TSH, T4TOTAL, FREET4, T3FREE, THYROIDAB in the last 72 hours. Anemia Panel: No results for input(s): VITAMINB12, FOLATE, FERRITIN, TIBC, IRON, RETICCTPCT in the last 72 hours. Sepsis Labs: Recent Labs  Lab 08/09/20 0858 08/09/20 1123 08/11/20 0130  LATICACIDVEN 4.1* 2.8* 1.2    Recent Results (from the past 240 hour(s))  Resp Panel by RT-PCR (Flu A&B, Covid) Nasopharyngeal Swab     Status: None   Collection Time: 08/09/20  9:26 AM   Specimen: Nasopharyngeal Swab; Nasopharyngeal(NP) swabs in vial transport medium  Result Value Ref Range Status   SARS Coronavirus 2 by RT PCR NEGATIVE NEGATIVE Final    Comment: (NOTE) SARS-CoV-2 target nucleic acids are NOT DETECTED.  The SARS-CoV-2 RNA is generally detectable in upper respiratory specimens during the acute phase of infection. The lowest concentration of SARS-CoV-2 viral copies this assay can detect is 138 copies/mL. A negative result does not preclude SARS-Cov-2 infection and should not be used as the sole basis for treatment or other patient management decisions. A negative result may occur with  improper specimen collection/handling, submission of specimen other than nasopharyngeal swab, presence of viral mutation(s) within the areas targeted by this assay, and inadequate number of viral copies(<138 copies/mL). A negative  result must be combined with clinical observations, patient history, and epidemiological information. The expected result is Negative.  Fact Sheet for Patients:  EntrepreneurPulse.com.au  Fact Sheet for Healthcare Providers:  IncredibleEmployment.be  This test is no t yet approved or cleared by the [place] FDA and  has been authorized for detection and/or diagnosis of SARS-CoV-2 by FDA under an Emergency Use Authorization (EUA). This EUA will remain  in effect (meaning this test can be used) for the duration of the COVID-19 declaration under Section 564(b)(1) of the Act, 21 U.S.C.section 360bbb-3(b)(1), unless the authorization is terminated  or revoked sooner.       Influenza A by PCR NEGATIVE NEGATIVE Final   Influenza B by PCR NEGATIVE NEGATIVE Final    Comment: (NOTE) The Xpert Xpress SARS-CoV-2/FLU/RSV plus assay is intended as an aid in the diagnosis of influenza from Nasopharyngeal swab specimens and should not be used as a sole basis for treatment.  Nasal washings and aspirates are unacceptable for Xpert Xpress SARS-CoV-2/FLU/RSV testing.  Fact Sheet for Patients: EntrepreneurPulse.com.au  Fact Sheet for Healthcare Providers: IncredibleEmployment.be  This test is not yet approved or cleared by the [place] FDA and has been authorized for detection and/or diagnosis of SARS-CoV-2 by FDA under an Emergency Use Authorization (EUA). This EUA will remain in effect (meaning this test can be used) for the duration of the COVID-19 declaration under Section 564(b)(1) of the Act, 21 U.S.C. section 360bbb-3(b)(1), unless the authorization is terminated or revoked.  Performed at [facility], [place] [address] [place], [place] [address]   Blood Culture (routine x 2)     Status: None (Preliminary result)   Collection Time: 08/09/20  9:26 AM   Specimen: BLOOD  Result Value Ref Range Status    Specimen Description BLOOD RIGHT ANTECUBITAL  Final   Special Requests   Final    BOTTLES DRAWN AEROBIC AND ANAEROBIC Blood Culture adequate volume   Culture   Final    NO GROWTH 2 DAYS Performed at [facility], [place] [address] [place], [place] [address]    Report Status PENDING  Incomplete  Blood Culture (routine x 2)     Status: None (Preliminary result)   Collection Time: 08/09/20  9:31 AM   Specimen: BLOOD  Result Value Ref Range Status   Specimen Description BLOOD RIGHT ANTECUBITAL  Final   Special Requests   Final    BOTTLES DRAWN AEROBIC AND ANAEROBIC Blood Culture adequate volume   Culture   Final    NO GROWTH 2 DAYS Performed at [facility], [place] [address] [place], [place] [address]    Report Status PENDING  Incomplete  Urine culture     Status: None   Collection Time: 08/09/20 11:59 AM   Specimen: In/Out Cath Urine  Result Value Ref Range Status   Specimen Description IN/OUT CATH URINE  Final   Special Requests NONE  Final   Culture   Final    NO GROWTH Performed at [facility], [place] [address] [place], [place] [address]    Report Status 08/10/2020 FINAL  Final  Surgical PCR screen     Status: None   Collection Time: 08/11/20  2:00 AM   Specimen: Nasal Mucosa; Nasal Swab  Result Value Ref Range Status   MRSA, PCR NEGATIVE NEGATIVE Final   Staphylococcus aureus NEGATIVE NEGATIVE Final    Comment: (NOTE) The Xpert SA Assay (FDA approved for NASAL specimens in patients 73 years of age and older), is one component of a comprehensive surveillance program. It is not intended to diagnose infection nor to guide or monitor treatment. Performed at [facility], [place] [address], [place],  [address]          Radiology Studies: DG Tibia/Fibula Left  Result Date: 08/09/2020 CLINICAL DATA:  Cellulitis. EXAM: LEFT TIBIA AND FIBULA - 2 VIEW COMPARISON:  None. FINDINGS: There is no evidence of fracture or other focal bone lesions.  Diffuse soft tissue swelling of the left lower extremity. No soft tissue emphysema seen. Heavy vascular calcifications. IMPRESSION: 1. No acute fracture or dislocation identified about the left tibia/fibula. 2. Diffuse soft tissue swelling. Electronically Signed   By: Fidela Salisbury M.D.   On: 08/09/2020 17:27   DG Ankle Complete Left  Result Date: 08/10/2020 CLINICAL DATA:  Left foot and ankle pain EXAM: LEFT FOOT - COMPLETE 3+ VIEW; LEFT ANKLE COMPLETE - 3+ VIEW COMPARISON:  08/09/2020 FINDINGS: There is extensive soft tissue swelling about the patient's foot and ankle. There is no acute displaced fracture or dislocation. Advanced vascular calcifications are noted. There is a moderate-sized plantar calcaneal spur. There are degenerative changes of the first metatarsophalangeal joint. There is a persistent erosion at the head of the proximal phalanx of the first digit. IMPRESSION: 1. No acute displaced fracture or dislocation. 2. Persistent soft tissue swelling is noted about the ankle and foot. 3. Unchanged erosion involving the head of the proximal phalanx of the first digit. 4. Persistent vascular calcifications are noted. Electronically Signed   By: Constance Holster M.D.   On: 08/10/2020 19:15   CT HEAD WO CONTRAST  Result Date: 08/09/2020 CLINICAL DATA:  Altered mental status EXAM: CT HEAD WITHOUT CONTRAST TECHNIQUE: Contiguous axial images were obtained from the base of the skull through the vertex without intravenous contrast. COMPARISON:  July 18, 2020 FINDINGS: Evaluation is limited secondary to patient motion. 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 No definitive acute infarction within the limitations of this mildly motion degraded exam. No acute hemorrhage. Periventricular white matter hypodensities consistent with sequela of chronic microvascular ischemic disease. Vascular: Vascular calcifications. Skull: No acute fracture  within the limitations of the exam. Sinuses/Orbits: Near complete opacification of the LEFT maxillary sinus with osseous sequela of chronic sinusitis. Internal calcifications and high density likely reflecting chronic fungal colonization. Other: None. IMPRESSION: 1. No definitive acute intracranial abnormality within the limitations of this mildly motion degraded exam. 2. 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 3. 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 Electronically Signed   By: Valentino Saxon MD   On: 08/09/2020 17:19   MR FOOT LEFT WO CONTRAST  Result Date: 08/10/2020 CLINICAL DATA:  Great toe osteomyelitis, long-term antibiotics. Chronic kidney disease. EXAM: MRI OF THE LEFT FOOT WITHOUT CONTRAST TECHNIQUE: Multiplanar, multisequence MR imaging of the left forefoot was performed. No intravenous contrast was administered. COMPARISON:  Radiographs 08/09/2020 and MRI from 07/20/2020 FINDINGS: Bones/Joint/Cartilage Reduction in the edema signal in the middle and distal phalanges of the great toe likely reflecting improvement in osteomyelitis. No interphalangeal joint effusion. Stable medial erosion of the head of the proximal phalanx great toe, image 8 series 8. No other regions of significant abnormal osseous edema are identified in the forefoot. Ligaments Lisfranc ligament intact. Muscles and Tendons Atrophic regional musculature. Soft tissues Dorsal subcutaneous edema in the forefoot, similar to previous laterally but with some increased dorsal edema medially compared to previous. IMPRESSION: 1. Reduction in edema signal in the middle and distal phalanges of the great toe likely reflecting improvement in osteomyelitis. 2. Stable medial erosion of the head of the proximal phalanx great toe. 3. Dorsal subcutaneous edema in the forefoot, similar to previous laterally but with some increased dorsal edema medially compared  to previous. Electronically Signed   By: Van Clines M.D.   On: 08/10/2020 08:00   DG Foot 2 Views Left  Result Date: 08/09/2020 CLINICAL DATA:  Left great toe wound. EXAM: LEFT FOOT - 2 VIEW COMPARISON:  None. FINDINGS: There is no evidence of fracture or dislocation. There is no evidence of cortical destructive changes. Left first digit tuft wound. Diffuse soft tissue swelling, particularly dorsally. IMPRESSION: 1. No acute fracture or dislocation identified about the left foot. 2. Left first digit tuft wound. 3. Diffuse soft tissue swelling, particularly dorsally. Electronically Signed   By: Thomas Hoff  Dimitrova M.D.   On: 08/09/2020 17:26   DG Foot Complete Left  Result Date: 08/10/2020 CLINICAL DATA:  Left foot and ankle pain EXAM: LEFT FOOT - COMPLETE 3+ VIEW; LEFT ANKLE COMPLETE - 3+ VIEW COMPARISON:  08/09/2020 FINDINGS: There is extensive soft tissue swelling about the patient's foot and ankle. There is no acute displaced fracture or dislocation. Advanced vascular calcifications are noted. There is a moderate-sized plantar calcaneal spur. There are degenerative changes of the first metatarsophalangeal joint. There is a persistent erosion at the head of the proximal phalanx of the first digit. IMPRESSION: 1. No acute displaced fracture or dislocation. 2. Persistent soft tissue swelling is noted about the ankle and foot. 3. Unchanged erosion involving the head of the proximal phalanx of the first digit. 4. Persistent vascular calcifications are noted. Electronically Signed   By: Constance Holster M.D.   On: 08/10/2020 19:15   VAS [place] LOWER EXTREMITY VENOUS (DVT) (ONLY [facility] & [facility])  Result Date: 08/10/2020  Lower Venous DVT Study Indications: Edema.  Comparison Study: Prior negative Left lower extremity venous duplex done                   01/31/20 Performing Technologist: Sharion Dove RVS  Examination Guidelines: A complete evaluation includes B-mode imaging, spectral Doppler, color Doppler,  and power Doppler as needed of all accessible portions of each vessel. Bilateral testing is considered an integral part of a complete examination. Limited examinations for reoccurring indications may be performed as noted. The reflux portion of the exam is performed with the patient in reverse Trendelenburg.  +-----+---------------+---------+-----------+----------+--------------+ RIGHTCompressibilityPhasicitySpontaneityPropertiesThrombus Aging +-----+---------------+---------+-----------+----------+--------------+ CFV  Full           Yes      Yes                                 +-----+---------------+---------+-----------+----------+--------------+   +---------+---------------+---------+-----------+----------+--------------+ LEFT     CompressibilityPhasicitySpontaneityPropertiesThrombus Aging +---------+---------------+---------+-----------+----------+--------------+ CFV      Full           Yes      Yes                                 +---------+---------------+---------+-----------+----------+--------------+ SFJ      Full                                                        +---------+---------------+---------+-----------+----------+--------------+ FV Prox  Full                                                        +---------+---------------+---------+-----------+----------+--------------+ FV Mid   Full                                                        +---------+---------------+---------+-----------+----------+--------------+ FV DistalFull                                                        +---------+---------------+---------+-----------+----------+--------------+  PFV      Full                                                        +---------+---------------+---------+-----------+----------+--------------+ POP      Full           Yes      Yes                                  +---------+---------------+---------+-----------+----------+--------------+ PTV      Full                                                        +---------+---------------+---------+-----------+----------+--------------+ PERO     Full                                                        +---------+---------------+---------+-----------+----------+--------------+     Summary: RIGHT: - No evidence of common femoral vein obstruction.  LEFT: - There is no evidence of deep vein thrombosis in the lower extremity.  - Ultrasound characteristics of enlarged lymph nodes noted in the groin. interstitial fluid noted throughout calf.  *See table(s) above for measurements and observations. Electronically signed by Servando Snare MD on 08/10/2020 at 5:59:30 PM.    Final         Scheduled Meds: . aspirin EC  81 mg Oral Daily  . Chlorhexidine Gluconate Cloth  6 each Topical Daily  . clopidogrel  75 mg Oral Daily  . ferrous sulfate  325 mg Oral Q M,W,F  . gabapentin  100 mg Oral BID  . heparin  5,000 Units Subcutaneous Q12H  . insulin aspart  0-9 Units Subcutaneous TID WC  . latanoprost  1 drop Both Eyes QHS  . pantoprazole  40 mg Oral QHS  . rosuvastatin  10 mg Oral Daily   Continuous Infusions: . sodium chloride 100 mL/hr at 08/11/20 0614  . ceFEPime (MAXIPIME) IV 2 g (08/11/20 0826)  . lactated ringers 75 mL/hr at 08/09/20 1840  . linezolid (ZYVOX) IV 600 mg (08/10/20 2232)     LOS: 2 days    Time spent: 36 minutes spent on chart review, discussion with nursing staff, consultants, updating family and interview/physical exam; more than 50% of that time was spent in counseling and/or coordination of care.    Nicole Defino J [place] DO [facility] Available via Epic secure chat 7am-7pm After these hours, please refer to coverage provider listed on [email or web address] 08/11/2020, 12:49 PM

## 2020-08-11 NOTE — Op Note (Signed)
Patient name: Micheal Walls MRN: [id number] DOB: April 13, 1936 Sex: male  08/11/2020 Pre-operative Diagnosis: Left toe ulcer Post-operative diagnosis:  Same Surgeon:  Annamarie Major Procedure Performed:  1.  Ultrasound-guided access, right femoral artery  2.  Abdominal aortogram with CO2  3.  Left lower extremity runoff  4.  Angioplasty, left anterior tibial artery  5.  Conscious sedation, 89 minutes  6.  Closure device, Mynx     Indications: This is an 85 year old gentleman who has undergone right leg amputation who now has a wound on his left foot.  He comes in today for angiographic evaluation and possible invention  Procedure:  The patient was identified in the holding area and taken to room 8.  The patient was then placed supine on the table and prepped and draped in the usual sterile fashion.  A time out was called.  Conscious sedation was administered with the use of IV fentanyl and Versed under continuous physician and nurse monitoring.  Heart rate, blood pressure, and oxygen saturation were continuously monitored.  Total sedation time was 89 minutes.  Ultrasound was used to evaluate the right common femoral artery.  It was patent .  A digital ultrasound image was acquired.  A micropuncture needle was used to access the right common femoral artery under ultrasound guidance.  An 018 wire was advanced without resistance and a micropuncture sheath was placed.  The 018 wire was removed and a benson wire was placed.  The micropuncture sheath was exchanged for a 5 french sheath.  An omniflush catheter was advanced over the wire to the level of L-1.  An abdominal angiogram with CO2 was obtained.  Next, using the omniflush catheter and a benson wire, the aortic bifurcation was crossed and the catheter was placed into theleft external iliac artery and left runoff was obtained.   Findings:   Aortogram: No significant renal artery stenosis was identified.  The infrarenal abdominal aorta is widely  patent.  Bilateral common and external iliac arteries are widely patent.  Right Lower Extremity: Not evaluated  Left Lower Extremity: Left common femoral and profundofemoral artery are widely patent.  The superficial femoral artery is widely patent without stenosis.  The popliteal artery is patent throughout its course.  The dominant runoff is the anterior tibial artery which crosses the ankle however in the distal leg there are tandem lesions >72%. the peroneal artery is patent down in the lower leg.  The posterior tibial artery is occluded.  Intervention: After the above images were acquired the decision was made to proceed with intervention.  A 6 French 65 cm sheath was inserted in the left superficial femoral artery and the patient was fully heparinized.  I used a Glidewire advantage to select the anterior tibial artery.  A 3 x 60 Mustang balloon was then inserted.  It was advanced across the lesion and balloon angioplasty of lesions of the anterior tibial artery were performed at nominal pressure for 2 minutes.  Completion imaging revealed resolution of the stenosis.  At this point catheters and wires were removed.  The groin was closed with a minx.  There are no immediate complications.  Impression:  #1  No significant aortoiliac occlusive disease.  #2  No significant left leg outflow stenosis  #3  Two-vessel runoff via the anterior tibial and peroneal artery.  There is tandem stenoses within the anterior tibial artery and the lower leg, greater than 70%.  These were successfully treated with balloon angioplasty using a 3 mm  balloon and no residual stenosis.     Theotis Burrow, M.D., [facility] [place] Office: [phone] Pager:  [phone]

## 2020-08-11 NOTE — Progress Notes (Signed)
  Subjective:  Patient ID: Micheal Walls, male    DOB: 1936/05/03,  MRN: [id number]   Patient seen resting at bedside comfortably. He is doing well s/p revascularization with Dr Trula Slade today.   Negative for chest pain and shortness of breath Objective:   Vitals:   08/11/20 1538 08/11/20 2118  BP: (!) 143/75 (!) 145/73  Pulse: 100 95  Resp: 20 20  Temp: 98.2 F (36.8 C) 98.1 F (36.7 C)  SpO2: 98% 98%   General AA&O x3. Normal mood and affect.  Vascular Pulses dopplerable. Still with edema. Warm foot  Neurologic Epicritic sensation grossly reduced.  Dermatologic Stable hallux ulcer, no purulence or malodor. Erythema in leg improving, edema improving, less tenderness  Orthopedic: MMT 5/5    Assessment & Plan:  Patient was evaluated and treated and all questions answered.   -Leg seems to be improving -Continue abx per infectious disease -Will follow day to day to see if he keeps improving s/p revascularization  [name], DPM  Accessible via secure chat for questions or concerns.

## 2020-08-11 NOTE — Progress Notes (Signed)
  Progress Note    08/11/2020 10:47 AM * No surgery date entered *  Subjective: No overnight complaints  Vitals:   08/11/20 0815 08/11/20 0900  BP: 116/62   Pulse:    Resp: (!) 26 20  Temp: 99 F (37.2 C)   SpO2: 100%     Physical Exam: Awake alert oriented Palpable left popliteal pulse Stable gangrenous changes left great toe  CBC    Component Value Date/Time   WBC 13.0 (H) 08/11/2020 0130   RBC 2.94 (L) 08/11/2020 0130   HGB 9.4 (L) 08/11/2020 0130   HGB 10.8 (L) 01/23/2020 1454   HCT 27.0 (L) 08/11/2020 0130   HCT 32.8 (L) 01/23/2020 1454   PLT 171 08/11/2020 0130   PLT 146 (L) 01/23/2020 1454   MCV 91.8 08/11/2020 0130   MCV 91 01/23/2020 1454   MCH 32.0 08/11/2020 0130   MCHC 34.8 08/11/2020 0130   RDW 17.0 (H) 08/11/2020 0130   RDW 14.3 01/23/2020 1454   LYMPHSABS 0.6 (L) 08/09/2020 0858   LYMPHSABS 1.8 03/15/2018 1022   MONOABS 1.0 08/09/2020 0858   EOSABS 0.0 08/09/2020 0858   EOSABS 0.2 03/15/2018 1022   BASOSABS 0.0 08/09/2020 0858   BASOSABS 0.0 03/15/2018 1022    BMET    Component Value Date/Time   NA 141 08/11/2020 0130   NA 140 05/20/2020 0951   K 3.9 08/11/2020 0130   CL 112 (H) 08/11/2020 0130   CO2 22 08/11/2020 0130   GLUCOSE 109 (H) 08/11/2020 0130   BUN 66 (H) 08/11/2020 0130   BUN 50 (H) 05/20/2020 0951   CREATININE 1.80 (H) 08/11/2020 0130   CALCIUM 9.2 08/11/2020 0130   GFRNONAA 37 (L) 08/11/2020 0130   GFRAA 42 (L) 05/20/2020 0951    INR    Component Value Date/Time   INR 1.2 08/09/2020 0858     Intake/Output Summary (Last 24 hours) at 08/11/2020 1047 Last data filed at 08/11/2020 0651 Gross per 24 hour  Intake --  Output 3850 ml  Net -3850 ml     Assessment:  85 y.o. male is admitted with sepsis and left great toe ulcer.  Plan: Angiogram today using CO2 possible tibial intervention  Plan for toe per podiatry   Rachard Isidro C. Donzetta Matters, MD [facility] [place] Office: [phone] Pager:  [phone]  08/11/2020 10:47 AM

## 2020-08-11 NOTE — Progress Notes (Signed)
[facility]  Date of Admission:  08/09/2020      Abx: 2/27-c linezolid 2/27-c cefepime  Previously doxy/augmentin since 07/22/20                                                      Assessment: 85 yo male pvd, dm2, s/p right bka, chronic left hallux ulcer recently admitted 2/06 for stroke, with mri left foot showing possible osteomyelitis hallux (no cx/podiatry intervention at that time), placed on abx doxy/augmentin empiric left hallux om, but readmitted for sepsis with cellulitic changes around the left toe despite on abx  #Severe sepsis with aki improved #cellulitis left LE #hx left hallux OM Repeat mri left foot this admission actually suggest improving om changes of the left hallux. Clinically it also doensn't appear the hallux OM is doing worse. the cellulitic region is rather away from the ischemic toe. Surprised it occurs on abx; ?mrsa not amenable to doxy Previously planned 6 weeks doxy/amox-clav from 2/09 2/27 bcx ngtd Don't think he needs cefepime Clinically improving  #pvd Angiogram planned with vascular surgery  Plan: 1. Stop cefepime; continue linezolid; restart amox-clav  2. Anticipate 10-14 days linezolid then switch back to doxy and continue amox-clav  Principal Problem:   Severe sepsis (HCC) Active Problems:   S/P unilateral BKA (below knee amputation) (HCC)   Mixed hyperlipidemia   Essential hypertension   Anemia, iron deficiency   CKD (chronic kidney disease) stage 4, GFR 15-29 ml/min (HCC)   History of stroke   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HCC)   Left ankle swelling   Scheduled Meds: . aspirin EC  81 mg Oral Daily  . Chlorhexidine Gluconate Cloth  6 each Topical Daily  . clopidogrel  75 mg Oral Daily  . ferrous sulfate  325 mg Oral Q M,W,F  . gabapentin  100 mg Oral BID  . heparin  5,000 Units Subcutaneous Q12H  . insulin aspart  0-9 Units Subcutaneous TID WC  . latanoprost  1 drop Both Eyes QHS  .  pantoprazole  40 mg Oral QHS  . rosuvastatin  10 mg Oral Daily   Continuous Infusions: . sodium chloride 100 mL/hr at 08/11/20 0614  . ceFEPime (MAXIPIME) IV 2 g (08/11/20 0826)  . lactated ringers 75 mL/hr at 08/09/20 1840  . linezolid (ZYVOX) IV 600 mg (08/10/20 2232)   PRN Meds:.acetaminophen **OR** acetaminophen, hydrALAZINE, HYDROmorphone (DILAUDID) injection, ondansetron **OR** ondansetron (ZOFRAN) IV, senna-docusate   SUBJECTIVE: Await angio Swelling/pain improving left distal tibia No n/v/diarrhea afebrile  Review of Systems: ROS  Allergies  Allergen Reactions  . Vancomycin Rash    OBJECTIVE: Vitals:   08/11/20 0700 08/11/20 0815 08/11/20 0900 08/11/20 1100  BP: 103/87 116/62  (!) 159/77  Pulse: 88   90  Resp: 20 (!) $Remo'26 20 20  'KVqbJ$ Temp: 98 F (36.7 C) 99 F (37.2 C)    TempSrc: Oral Oral  Oral  SpO2: 99% 100%  97%  Weight:      Height:       Body mass index is 36.94 kg/m.  Physical Exam No distress, conversant Heent: normocephalic; per; conj clear; eomi Neck supple cv rrr no mrg Lungs clear; normal respiratory effort abd s/nt Ext 1-2 plus edema LLE; right bka stump nontender Skin large vessicular changes around distal RLE  not involving the foot; tender and warm to palpation; wrinkling skin noted --> overall continues to improve today msk no synovitis; the distal part of the left hallux appears to have dry gangrene without discharge/redness/swelling Neuro nonfocal; alert/oriented Psych mood normal  Lab Results Lab Results  Component Value Date   WBC 13.0 (H) 08/11/2020   HGB 9.4 (L) 08/11/2020   HCT 27.0 (L) 08/11/2020   MCV 91.8 08/11/2020   PLT 171 08/11/2020    Lab Results  Component Value Date   CREATININE 1.80 (H) 08/11/2020   BUN 66 (H) 08/11/2020   NA 141 08/11/2020   K 3.9 08/11/2020   CL 112 (H) 08/11/2020   CO2 22 08/11/2020    Lab Results  Component Value Date   ALT 20 08/09/2020   AST 28 08/09/2020   ALKPHOS 42 08/09/2020    BILITOT 0.9 08/09/2020     Microbiology: Recent Results (from the past 240 hour(s))  Resp Panel by RT-PCR (Flu A&B, Covid) Nasopharyngeal Swab     Status: None   Collection Time: 08/09/20  9:26 AM   Specimen: Nasopharyngeal Swab; Nasopharyngeal(NP) swabs in vial transport medium  Result Value Ref Range Status   SARS Coronavirus 2 by RT PCR NEGATIVE NEGATIVE Final    Comment: (NOTE) SARS-CoV-2 target nucleic acids are NOT DETECTED.  The SARS-CoV-2 RNA is generally detectable in upper respiratory specimens during the acute phase of infection. The lowest concentration of SARS-CoV-2 viral copies this assay can detect is 138 copies/mL. A negative result does not preclude SARS-Cov-2 infection and should not be used as the sole basis for treatment or other patient management decisions. A negative result may occur with  improper specimen collection/handling, submission of specimen other than nasopharyngeal swab, presence of viral mutation(s) within the areas targeted by this assay, and inadequate number of viral copies(<138 copies/mL). A negative result must be combined with clinical observations, patient history, and epidemiological information. The expected result is Negative.  Fact Sheet for Patients:  EntrepreneurPulse.com.au  Fact Sheet for Healthcare Providers:  IncredibleEmployment.be  This test is no t yet approved or cleared by the [place] FDA and  has been authorized for detection and/or diagnosis of SARS-CoV-2 by FDA under an Emergency Use Authorization (EUA). This EUA will remain  in effect (meaning this test can be used) for the duration of the COVID-19 declaration under Section 564(b)(1) of the Act, 21 U.S.C.section 360bbb-3(b)(1), unless the authorization is terminated  or revoked sooner.       Influenza A by PCR NEGATIVE NEGATIVE Final   Influenza B by PCR NEGATIVE NEGATIVE Final    Comment: (NOTE) The Xpert Xpress  SARS-CoV-2/FLU/RSV plus assay is intended as an aid in the diagnosis of influenza from Nasopharyngeal swab specimens and should not be used as a sole basis for treatment. Nasal washings and aspirates are unacceptable for Xpert Xpress SARS-CoV-2/FLU/RSV testing.  Fact Sheet for Patients: EntrepreneurPulse.com.au  Fact Sheet for Healthcare Providers: IncredibleEmployment.be  This test is not yet approved or cleared by the [place] FDA and has been authorized for detection and/or diagnosis of SARS-CoV-2 by FDA under an Emergency Use Authorization (EUA). This EUA will remain in effect (meaning this test can be used) for the duration of the COVID-19 declaration under Section 564(b)(1) of the Act, 21 U.S.C. section 360bbb-3(b)(1), unless the authorization is terminated or revoked.  Performed at [facility], [place] [address] [place], [place] [address]   Blood Culture (routine x 2)     Status: None (  Preliminary result)   Collection Time: 08/09/20  9:26 AM   Specimen: BLOOD  Result Value Ref Range Status   Specimen Description BLOOD RIGHT ANTECUBITAL  Final   Special Requests   Final    BOTTLES DRAWN AEROBIC AND ANAEROBIC Blood Culture adequate volume   Culture   Final    NO GROWTH 2 DAYS Performed at [facility], [address], [place], [place] [address]    Report Status PENDING  Incomplete  Blood Culture (routine x 2)     Status: None (Preliminary result)   Collection Time: 08/09/20  9:31 AM   Specimen: BLOOD  Result Value Ref Range Status   Specimen Description BLOOD RIGHT ANTECUBITAL  Final   Special Requests   Final    BOTTLES DRAWN AEROBIC AND ANAEROBIC Blood Culture adequate volume   Culture   Final    NO GROWTH 2 DAYS Performed at [facility], [place] [address] [place], [place] [address]    Report Status PENDING  Incomplete  Urine culture     Status: None   Collection Time: 08/09/20 11:59 AM   Specimen: In/Out  Cath Urine  Result Value Ref Range Status   Specimen Description IN/OUT CATH URINE  Final   Special Requests NONE  Final   Culture   Final    NO GROWTH Performed at [facility], [place] [address] [place], [place] [address]    Report Status 08/10/2020 FINAL  Final  Surgical PCR screen     Status: None   Collection Time: 08/11/20  2:00 AM   Specimen: Nasal Mucosa; Nasal Swab  Result Value Ref Range Status   MRSA, PCR NEGATIVE NEGATIVE Final   Staphylococcus aureus NEGATIVE NEGATIVE Final    Comment: (NOTE) The Xpert SA Assay (FDA approved for NASAL specimens in patients 63 years of age and older), is one component of a comprehensive surveillance program. It is not intended to diagnose infection nor to guide or monitor treatment. Performed at [facility], [place] [address] [place], [place] [address]     Serology:  Imaging: If present, new imagings (plain films, ct scans, and mri) have been personally visualized and interpreted; radiology reports have been reviewed. Decision making incorporated into the Impression / Recommendations.  2/28 mri left foot 1. Reduction in edema signal in the middle and distal phalanges of the great toe likely reflecting improvement in osteomyelitis. 2. Stable medial erosion of the head of the proximal phalanx great toe. 3. Dorsal subcutaneous edema in the forefoot, similar to previous laterally but with some increased dorsal edema medially compared to previous.  Jabier Mutton, [facility] [place] [phone] pager    08/11/2020, 1:20 PM

## 2020-08-11 NOTE — Interval H&P Note (Signed)
History and Physical Interval Note:  08/11/2020 11:33 AM  Micheal Walls  has presented today for surgery, with the diagnosis of pad.  The various methods of treatment have been discussed with the patient and family. After consideration of risks, benefits and other options for treatment, the patient has consented to  Procedure(s): ABDOMINAL AORTOGRAM W/LOWER EXTREMITY (N/A) as a surgical intervention.  The patient's history has been reviewed, patient examined, no change in status, stable for surgery.  I have reviewed the patient's chart and labs.  Questions were answered to the patient's satisfaction.     Annamarie Major

## 2020-08-11 NOTE — Op Note (Deleted)
tg

## 2020-08-11 NOTE — H&P (View-Only) (Signed)
  Progress Note    08/11/2020 10:47 AM * No surgery date entered *  Subjective: No overnight complaints  Vitals:   08/11/20 0815 08/11/20 0900  BP: 116/62   Pulse:    Resp: (!) 26 20  Temp: 99 F (37.2 C)   SpO2: 100%     Physical Exam: Awake alert oriented Palpable left popliteal pulse Stable gangrenous changes left great toe  CBC    Component Value Date/Time   WBC 13.0 (H) 08/11/2020 0130   RBC 2.94 (L) 08/11/2020 0130   HGB 9.4 (L) 08/11/2020 0130   HGB 10.8 (L) 01/23/2020 1454   HCT 27.0 (L) 08/11/2020 0130   HCT 32.8 (L) 01/23/2020 1454   PLT 171 08/11/2020 0130   PLT 146 (L) 01/23/2020 1454   MCV 91.8 08/11/2020 0130   MCV 91 01/23/2020 1454   MCH 32.0 08/11/2020 0130   MCHC 34.8 08/11/2020 0130   RDW 17.0 (H) 08/11/2020 0130   RDW 14.3 01/23/2020 1454   LYMPHSABS 0.6 (L) 08/09/2020 0858   LYMPHSABS 1.8 03/15/2018 1022   MONOABS 1.0 08/09/2020 0858   EOSABS 0.0 08/09/2020 0858   EOSABS 0.2 03/15/2018 1022   BASOSABS 0.0 08/09/2020 0858   BASOSABS 0.0 03/15/2018 1022    BMET    Component Value Date/Time   NA 141 08/11/2020 0130   NA 140 05/20/2020 0951   K 3.9 08/11/2020 0130   CL 112 (H) 08/11/2020 0130   CO2 22 08/11/2020 0130   GLUCOSE 109 (H) 08/11/2020 0130   BUN 66 (H) 08/11/2020 0130   BUN 50 (H) 05/20/2020 0951   CREATININE 1.80 (H) 08/11/2020 0130   CALCIUM 9.2 08/11/2020 0130   GFRNONAA 37 (L) 08/11/2020 0130   GFRAA 42 (L) 05/20/2020 0951    INR    Component Value Date/Time   INR 1.2 08/09/2020 0858     Intake/Output Summary (Last 24 hours) at 08/11/2020 1047 Last data filed at 08/11/2020 0651 Gross per 24 hour  Intake -  Output 3850 ml  Net -3850 ml     Assessment:  85 y.o. male is admitted with sepsis and left great toe ulcer.  Plan: Angiogram today using CO2 possible tibial intervention  Plan for toe per podiatry   Kyrah Schiro C. Donzetta Matters, MD [facility] Office: [phone] Pager:  [phone]  08/11/2020 10:47 AM

## 2020-08-12 ENCOUNTER — Encounter (HOSPITAL_COMMUNITY): Payer: Self-pay | Admitting: Surgery

## 2020-08-12 ENCOUNTER — Inpatient Hospital Stay: Payer: Medicare Other | Admitting: Internal Medicine

## 2020-08-12 ENCOUNTER — Encounter (HOSPITAL_COMMUNITY): Payer: Medicare Other

## 2020-08-12 ENCOUNTER — Ambulatory Visit: Payer: Medicare Other | Admitting: Vascular Surgery

## 2020-08-12 DIAGNOSIS — A419 Sepsis, unspecified organism: Secondary | ICD-10-CM | POA: Diagnosis not present

## 2020-08-12 DIAGNOSIS — R6520 Severe sepsis without septic shock: Secondary | ICD-10-CM | POA: Diagnosis not present

## 2020-08-12 DIAGNOSIS — L03116 Cellulitis of left lower limb: Secondary | ICD-10-CM | POA: Diagnosis not present

## 2020-08-12 DIAGNOSIS — M869 Osteomyelitis, unspecified: Secondary | ICD-10-CM | POA: Diagnosis not present

## 2020-08-12 LAB — BASIC METABOLIC PANEL
Anion gap: 8 (ref 5–15)
BUN: 48 mg/dL — ABNORMAL HIGH (ref 8–23)
CO2: 20 mmol/L — ABNORMAL LOW (ref 22–32)
Calcium: 9.4 mg/dL (ref 8.9–10.3)
Chloride: 113 mmol/L — ABNORMAL HIGH (ref 98–111)
Creatinine, Ser: 1.42 mg/dL — ABNORMAL HIGH (ref 0.61–1.24)
GFR, Estimated: 49 mL/min — ABNORMAL LOW (ref >60)
Glucose, Bld: 111 mg/dL — ABNORMAL HIGH (ref 70–99)
Potassium: 4.2 mmol/L (ref 3.5–5.1)
Sodium: 141 mmol/L (ref 135–145)

## 2020-08-12 LAB — GLUCOSE, CAPILLARY
Glucose-Capillary: 127 mg/dL — ABNORMAL HIGH (ref 70–99)
Glucose-Capillary: 128 mg/dL — ABNORMAL HIGH (ref 70–99)
Glucose-Capillary: 157 mg/dL — ABNORMAL HIGH (ref 70–99)
Glucose-Capillary: 163 mg/dL — ABNORMAL HIGH (ref 70–99)

## 2020-08-12 LAB — CBC
HCT: 28.1 % — ABNORMAL LOW (ref 39.0–52.0)
Hemoglobin: 9.6 g/dL — ABNORMAL LOW (ref 13.0–17.0)
MCH: 31.7 pg (ref 26.0–34.0)
MCHC: 34.2 g/dL (ref 30.0–36.0)
MCV: 92.7 fL (ref 80.0–100.0)
Platelets: 171 10*3/uL (ref 150–400)
RBC: 3.03 MIL/uL — ABNORMAL LOW (ref 4.22–5.81)
RDW: 17.1 % — ABNORMAL HIGH (ref 11.5–15.5)
WBC: 14.2 10*3/uL — ABNORMAL HIGH (ref 4.0–10.5)
nRBC: 0 % (ref 0.0–0.2)

## 2020-08-12 MED ORDER — DOXYCYCLINE HYCLATE 100 MG PO TABS: 100.0000 mg | ORAL_TABLET | Freq: Two times a day (BID) | ORAL | Status: DC

## 2020-08-12 NOTE — Progress Notes (Signed)
  Progress Note    08/12/2020 2:46 PM 1 Day Post-Op  Subjective:  No new complaints  Vitals:   08/12/20 1101 08/12/20 1300  BP: 138/68 (!) 145/77  Pulse: 90 93  Resp: 19 18  Temp: 97.6 F (36.4 C) 98.1 F (36.7 C)  SpO2: 100% 100%    Physical Exam: aaox3 Left AT biphasic signal at ankle  CBC    Component Value Date/Time   WBC 14.2 (H) 08/12/2020 0130   RBC 3.03 (L) 08/12/2020 0130   HGB 9.6 (L) 08/12/2020 0130   HGB 10.8 (L) 01/23/2020 1454   HCT 28.1 (L) 08/12/2020 0130   HCT 32.8 (L) 01/23/2020 1454   PLT 171 08/12/2020 0130   PLT 146 (L) 01/23/2020 1454   MCV 92.7 08/12/2020 0130   MCV 91 01/23/2020 1454   MCH 31.7 08/12/2020 0130   MCHC 34.2 08/12/2020 0130   RDW 17.1 (H) 08/12/2020 0130   RDW 14.3 01/23/2020 1454   LYMPHSABS 0.6 (L) 08/09/2020 0858   LYMPHSABS 1.8 03/15/2018 1022   MONOABS 1.0 08/09/2020 0858   EOSABS 0.0 08/09/2020 0858   EOSABS 0.2 03/15/2018 1022   BASOSABS 0.0 08/09/2020 0858   BASOSABS 0.0 03/15/2018 1022    BMET    Component Value Date/Time   NA 141 08/12/2020 0130   NA 140 05/20/2020 0951   K 4.2 08/12/2020 0130   CL 113 (H) 08/12/2020 0130   CO2 20 (L) 08/12/2020 0130   GLUCOSE 111 (H) 08/12/2020 0130   BUN 48 (H) 08/12/2020 0130   BUN 50 (H) 05/20/2020 0951   CREATININE 1.42 (H) 08/12/2020 0130   CALCIUM 9.4 08/12/2020 0130   GFRNONAA 49 (L) 08/12/2020 0130   GFRAA 42 (L) 05/20/2020 0951    INR    Component Value Date/Time   INR 1.2 08/09/2020 0858     Intake/Output Summary (Last 24 hours) at 08/12/2020 1446 Last data filed at 08/12/2020 0700 Gross per 24 hour  Intake 1000 ml  Output 3950 ml  Net -2950 ml     Assessment:  85 y.o. male is s/p left AT pta for L great toe ulcer. Excellent result by bedside doppler.   Plan: Will f/u in our office in 3 to 4 weeks with left lower extremity duplex and ABIs  Management of left great toe ulcer by podiatry   Vascular surgery will be available as  needed   Anabelen Kaminsky C. Donzetta Matters, MD [facility] [place] Office: [phone] Pager: [phone]  08/12/2020 2:46 PM

## 2020-08-12 NOTE — Progress Notes (Signed)
PROGRESS NOTE    Micheal Walls  TIR:[id number] DOB: [birth year] DOA: 08/09/2020 PCP: [name], [name]    Brief Narrative:  Micheal Walls is a 85 year old male with past medical history significant for essential hypertension, CKD stage IIIb, chronic diastolic congestive heart failure, type 2 diabetes mellitus, history of recent CVA, recent left great toe osteomyelitis on oral antibiotics with Augmentin/doxycycline outpatient, diabetic neuropathy who presented to the ED with progressive left lower extremity pain, swelling, redness and confusion. Patient history was obtained by his brother who is present at bedside. Recently diagnosed with left great toe osteomyelitis and on oral antibiotics with doxycycline/Augmentin since 07/22/2020. Patient also follows with wound care outpatient. Brother concerned about new redness to the left shin and worsening pain.  In the ED, temperature 98.5 F, HR 111, RR 20, BP 115/55, SPO2 90% on 2 L nasal cannula. Sodium 140, potassium 3.9, chloride 108, CO2 17, glucose 175, BUN 104, creatinine 2.5, AST 28, ALT 20, total bilirubin 0.9. CK 362, high-sensitivity troponin 112>114. Lactic acid 4.1. WBC 9.9, hemoglobin 10.0, platelets 219. INR 1.2. Covid-19/influenza A/B PCR negative. Urinalysis unrevealing. CT head with no definitive acute intracranial normality with revisualization of sequela of remote prior infarction pons and bilateral cerebellum. Blood cultures x2 and urine culture: Pending. Patient referred by EDP for admission for further evaluation and management of confusion secondary to severe sepsis from left lower extremity cellulitis/great toe osteomyelitis.   Assessment & Plan:   Principal Problem:   Severe sepsis ([place]) Active Problems:   S/P unilateral BKA (below knee amputation) (HCC)   Mixed hyperlipidemia   Essential hypertension   Anemia, iron deficiency   CKD (chronic kidney disease) stage 4, GFR 15-29 ml/min (HCC)   History of stroke   Left  hallux osteomyelitis (HCC)   Left ankle swelling   Severe sepsis, POA Left great toe osteomyelitis Patient presenting to the ED with progressive left lower extremity edema associated with pain and redness. Recently discharged with outpatient antibiotics for treatment of left great toe osteomyelitis. Patient with elevated lactic acid of 4.1. X-ray left foot with no acute fracture or dislocation with notable left first digit tuft wound with diffuse soft tissue swelling. Left lower extremity vascular ultrasound negative for DVT.  Patient underwent abdominal aortogram with balloon angioplasty left lower extremity anterior tibialis by Dr. Trula Slade on 08/11/2020. --ID/podiatry/vascular surgery following, appreciate assistance --Lactic acid 4.1>2.8>1.2 --ESR: 55 (29 on 07/20/20) --CRP: 26.4 (21.8 on 07/20/20) --Blood cultures x2: No growth x 3 days --Zyvox 613m PO q12h and Augmentin 875-1279mBID until 08/19/20 then will resume doxy/augmentin until 08/29/20 --outpatient f/u ID, Dr. VuGale Journey/17 at [id number] metabolic encephalopathy, POA Etiology likely related to acute infectious process as above as well as acute on chronic renal failure. CT head without contrast with no acute intracranial findings. Mentation now appears to be back to his normal baseline. --Supportive care  Acute renal failure on CKD stage IIIb Baseline creatinine 1.7. Creatinine on admission 2.80. Etiology likely secondary to underlying severe sepsis and volume depletion as above. --Cr 2.80>2.03>1.80>1.43 --Avoid nephrotoxins, renal dose all medications --Follow BMP daily  Peripheral vascular disease Left lower extremity ABI on 07/19/2020 nonconclusive. Has been previously followed by vascular surgery, Dr. DiScot Dock--Vascular following w/ planned abdominal aortogram with lower extremity today --Continue Plavix 75 mg p.o. daily --Aspirin 81 mg p.o. daily --Crestor 10 mg p.o. daily  Essential hypertension On furosemide 40 mg qAm and 8054mqPM at home. TTE 07/19/2020 with LVEF 55-60%, moderate LVH, trivial  MR and IVC dilated. --Hold home furosemide secondary to AKI as above --Hydralazine 19m PO q8h prn for SBP >170 or DBP >110 --Continue to monitor BP closely  Type 2 diabetes mellitus Home regimen includes semaglutide 1 mg subcutaneously weekly. A1c 6.1 on 07/19/2020, well controlled. --Insulin sliding scale for coverage --CBGs QAC/HS  Peripheral neuropathy: Gabapentin 100 mg BID  GERD: Continue PPI  Weakness/debility/deconditioning: --Pending PT/OT evaluation   DVT prophylaxis: Heparin   Code Status: Full Code Family Communication: Updated patient extensively at bedside, attempted to update patient's daughter MLeda Gauzevia telephone, went straight to voicemail.   Disposition Plan:  Level of care: Telemetry Medical Status is: Inpatient  Remains inpatient appropriate because:Ongoing diagnostic testing needed not appropriate for outpatient work up, Unsafe d/c plan, IV treatments appropriate due to intensity of illness or inability to take PO and Inpatient level of care appropriate due to severity of illness   Dispo: The patient is from: Home              Anticipated d/c is to: Home              Patient currently is not medically stable to d/c.   Difficult to place patient No   Consultants:   Podiatry  Infectious disease, Dr. VGale Journey signed off 3/2  Vascular surgery  Procedures:   Abdominal aortogram with balloon angioplasty left lower extremity anterior tibialis 08/11/2020, Dr. BTrula Slade Antimicrobials:   Zyvox 2/27>>  Augmentin 3/1>>  Cefepime 2/27 - 3/1  Metronidazole 2/27 - 2/27    Subjective: Patient seen and examined bedside, resting comfortably.  Family present.  No other questions or concerns at this time.  Denies headache, no visual changes, no chest pain, no palpitations, no shortness of breath, no abdominal pain, no weakness. No acute events overnight per nursing staff.  Objective: Vitals:    08/12/20 0700 08/12/20 0800 08/12/20 1000 08/12/20 1101  BP: 138/71 (!) 135/95 132/65 138/68  Pulse: 83 84 89 90  Resp: (!) 22 20 20 19   Temp: 98.2 F (36.8 C)   97.6 F (36.4 C)  TempSrc: Oral   Oral  SpO2: 98% 98% 99% 100%  Weight:      Height:        Intake/Output Summary (Last 24 hours) at 08/12/2020 1331 Last data filed at 08/12/2020 0700 Gross per 24 hour  Intake 1000 ml  Output 3950 ml  Net -2950 ml   Filed Weights   08/09/20 0909  Weight: 127 kg    Examination:  General exam: Appears calm and comfortable, chronically ill in appearance Respiratory system: Clear to auscultation. Respiratory effort normal. Oxygenating well on room air Cardiovascular system: S1 & S2 heard, RRR. No JVD, murmurs, rubs, gallops or clicks. 2+ pitting edema left lower extremity to mid shin  Gastrointestinal system: Abdomen is nondistended, soft and nontender. No organomegaly or masses felt. Normal bowel sounds heard. Central nervous system: Alert and oriented. No focal neurological deficits. Extremities: Noted right BKA, Skin: cellulitic changes left distal lower extremity that is tender to palpation, left great toe ulceration noted; see pictures below Psychiatry: Judgement and insight appear normal. Mood & affect appropriate.           Data Reviewed: I have personally reviewed following labs and imaging studies  CBC: Recent Labs  Lab 08/09/20 0858 08/09/20 0954 08/10/20 0059 08/11/20 0130 08/12/20 0130  WBC 9.9  --  12.1* 13.0* 14.2*  NEUTROABS 8.2*  --   --   --   --  HGB 10.0* 10.5* 9.5* 9.4* 9.6*  HCT 31.9* 31.0* 27.4* 27.0* 28.1*  MCV 98.2  --  93.8 91.8 92.7  PLT 219  --  205 171 212   Basic Metabolic Panel: Recent Labs  Lab 08/09/20 0858 08/09/20 0954 08/10/20 0059 08/11/20 0130 08/12/20 0130  NA 140 143 142 141 141  K 3.9 3.9 3.8 3.9 4.2  CL 108 108 111 112* 113*  CO2 17*  --  19* 22 20*  GLUCOSE 175* 171* 109* 109* 111*  BUN 104* 102* 86* 66* 48*   CREATININE 2.85* 2.80* 2.03* 1.80* 1.42*  CALCIUM 9.6  --  9.0 9.2 9.4  MG 2.1  --   --  2.2  --    GFR: Estimated Creatinine Clearance: 56 mL/min (A) (by C-G formula based on SCr of 1.42 mg/dL (H)). Liver Function Tests: Recent Labs  Lab 08/09/20 0858  AST 28  ALT 20  ALKPHOS 42  BILITOT 0.9  PROT 6.4*  ALBUMIN 3.1*   Recent Labs  Lab 08/09/20 0858  LIPASE 69*   No results for input(s): AMMONIA in the last 168 hours. Coagulation Profile: Recent Labs  Lab 08/09/20 0858  INR 1.2   Cardiac Enzymes: Recent Labs  Lab 08/09/20 0858  CKTOTAL 362   BNP (last 3 results) No results for input(s): PROBNP in the last 8760 hours. HbA1C: No results for input(s): HGBA1C in the last 72 hours. CBG: Recent Labs  Lab 08/11/20 1140 08/11/20 1605 08/11/20 2123 08/12/20 0639 08/12/20 1102  GLUCAP 110* 89 178* 128* 163*   Lipid Profile: No results for input(s): CHOL, HDL, LDLCALC, TRIG, CHOLHDL, LDLDIRECT in the last 72 hours. Thyroid Function Tests: No results for input(s): TSH, T4TOTAL, FREET4, T3FREE, THYROIDAB in the last 72 hours. Anemia Panel: No results for input(s): VITAMINB12, FOLATE, FERRITIN, TIBC, IRON, RETICCTPCT in the last 72 hours. Sepsis Labs: Recent Labs  Lab 08/09/20 0858 08/09/20 1123 08/11/20 0130  LATICACIDVEN 4.1* 2.8* 1.2    Recent Results (from the past 240 hour(s))  Resp Panel by RT-PCR (Flu A&B, Covid) Nasopharyngeal Swab     Status: None   Collection Time: 08/09/20  9:26 AM   Specimen: Nasopharyngeal Swab; Nasopharyngeal(NP) swabs in vial transport medium  Result Value Ref Range Status   SARS Coronavirus 2 by RT PCR NEGATIVE NEGATIVE Final    Comment: (NOTE) SARS-CoV-2 target nucleic acids are NOT DETECTED.  The SARS-CoV-2 RNA is generally detectable in upper respiratory specimens during the acute phase of infection. The lowest concentration of SARS-CoV-2 viral copies this assay can detect is 138 copies/mL. A negative result does not  preclude SARS-Cov-2 infection and should not be used as the sole basis for treatment or other patient management decisions. A negative result may occur with  improper specimen collection/handling, submission of specimen other than nasopharyngeal swab, presence of viral mutation(s) within the areas targeted by this assay, and inadequate number of viral copies(<138 copies/mL). A negative result must be combined with clinical observations, patient history, and epidemiological information. The expected result is Negative.  Fact Sheet for Patients:  EntrepreneurPulse.com.au  Fact Sheet for Healthcare Providers:  IncredibleEmployment.be  This test is no t yet approved or cleared by the [place] FDA and  has been authorized for detection and/or diagnosis of SARS-CoV-2 by FDA under an Emergency Use Authorization (EUA). This EUA will remain  in effect (meaning this test can be used) for the duration of the COVID-19 declaration under Section 564(b)(1) of the Act, 21 U.S.C.section 360bbb-3(b)(1), unless the  authorization is terminated  or revoked sooner.       Influenza A by PCR NEGATIVE NEGATIVE Final   Influenza B by PCR NEGATIVE NEGATIVE Final    Comment: (NOTE) The Xpert Xpress SARS-CoV-2/FLU/RSV plus assay is intended as an aid in the diagnosis of influenza from Nasopharyngeal swab specimens and should not be used as a sole basis for treatment. Nasal washings and aspirates are unacceptable for Xpert Xpress SARS-CoV-2/FLU/RSV testing.  Fact Sheet for Patients: EntrepreneurPulse.com.au  Fact Sheet for Healthcare Providers: IncredibleEmployment.be  This test is not yet approved or cleared by the [place] FDA and has been authorized for detection and/or diagnosis of SARS-CoV-2 by FDA under an Emergency Use Authorization (EUA). This EUA will remain in effect (meaning this test can be used) for the  duration of the COVID-19 declaration under Section 564(b)(1) of the Act, 21 U.S.C. section 360bbb-3(b)(1), unless the authorization is terminated or revoked.  Performed at [facility], [place] [address] [place], [place] [address]   Blood Culture (routine x 2)     Status: None (Preliminary result)   Collection Time: 08/09/20  9:26 AM   Specimen: BLOOD  Result Value Ref Range Status   Specimen Description BLOOD RIGHT ANTECUBITAL  Final   Special Requests   Final    BOTTLES DRAWN AEROBIC AND ANAEROBIC Blood Culture adequate volume   Culture   Final    NO GROWTH 3 DAYS Performed at [facility], [place] [address] [place], [place] [address]    Report Status PENDING  Incomplete  Blood Culture (routine x 2)     Status: None (Preliminary result)   Collection Time: 08/09/20  9:31 AM   Specimen: BLOOD  Result Value Ref Range Status   Specimen Description BLOOD RIGHT ANTECUBITAL  Final   Special Requests   Final    BOTTLES DRAWN AEROBIC AND ANAEROBIC Blood Culture adequate volume   Culture   Final    NO GROWTH 3 DAYS Performed at [facility], [place] [address] [place], [place] [address]    Report Status PENDING  Incomplete  Urine culture     Status: None   Collection Time: 08/09/20 11:59 AM   Specimen: In/Out Cath Urine  Result Value Ref Range Status   Specimen Description IN/OUT CATH URINE  Final   Special Requests NONE  Final   Culture   Final    NO GROWTH Performed at [place] [facility], [place] [address] [place], [place] [address]    Report Status 08/10/2020 FINAL  Final  Surgical PCR screen     Status: None   Collection Time: 08/11/20  2:00 AM   Specimen: Nasal Mucosa; Nasal Swab  Result Value Ref Range Status   MRSA, PCR NEGATIVE NEGATIVE Final   Staphylococcus aureus NEGATIVE NEGATIVE Final    Comment: (NOTE) The Xpert SA Assay (FDA approved for NASAL specimens in patients 20 years of age and older), is one component of a comprehensive surveillance program.  It is not intended to diagnose infection nor to guide or monitor treatment. Performed at [facility], [place] [address] [place], [place] [address]          Radiology Studies: DG Ankle Complete Left  Result Date: 08/10/2020 CLINICAL DATA:  Left foot and ankle pain EXAM: LEFT FOOT - COMPLETE 3+ VIEW; LEFT ANKLE COMPLETE - 3+ VIEW COMPARISON:  08/09/2020 FINDINGS: There is extensive soft tissue swelling about the patient's foot and ankle. There is no acute  displaced fracture or dislocation. Advanced vascular calcifications are noted. There is a moderate-sized plantar calcaneal spur. There are degenerative changes of the first metatarsophalangeal joint. There is a persistent erosion at the head of the proximal phalanx of the first digit. IMPRESSION: 1. No acute displaced fracture or dislocation. 2. Persistent soft tissue swelling is noted about the ankle and foot. 3. Unchanged erosion involving the head of the proximal phalanx of the first digit. 4. Persistent vascular calcifications are noted. Electronically Signed   By: Constance Holster M.D.   On: 08/10/2020 19:15   PERIPHERAL VASCULAR CATHETERIZATION  Result Date: 08/11/2020 Patient name: TRISTRAM MILIAN MRN: [id number] DOB: [birth year] Sex: male 08/11/2020 Pre-operative Diagnosis: Left toe ulcer Post-operative diagnosis:  Same Surgeon:  Annamarie Major Procedure Performed:  1.  Ultrasound-guided access, right femoral artery  2.  Abdominal aortogram with CO2  3.  Left lower extremity runoff  4.  Angioplasty, left anterior tibial artery  5.  Conscious sedation, 89 minutes  6.  Closure device, Mynx  Indications: This is an 85 year old gentleman who has undergone right leg amputation who now has a wound on his left foot.  He comes in today for angiographic evaluation and possible invention Procedure:  The patient was identified in the holding area and taken to room 8.  The patient was then placed supine on the table and prepped and draped in the usual  sterile fashion.  A time out was called.  Conscious sedation was administered with the use of IV fentanyl and Versed under continuous physician and nurse monitoring.  Heart rate, blood pressure, and oxygen saturation were continuously monitored.  Total sedation time was 89 minutes.  Ultrasound was used to evaluate the right common femoral artery.  It was patent .  A digital ultrasound image was acquired.  A micropuncture needle was used to access the right common femoral artery under ultrasound guidance.  An 018 wire was advanced without resistance and a micropuncture sheath was placed.  The 018 wire was removed and a benson wire was placed.  The micropuncture sheath was exchanged for a 5 french sheath.  An omniflush catheter was advanced over the wire to the level of L-1.  An abdominal angiogram with CO2 was obtained.  Next, using the omniflush catheter and a benson wire, the aortic bifurcation was crossed and the catheter was placed into theleft external iliac artery and left runoff was obtained.  Findings:  Aortogram: No significant renal artery stenosis was identified.  The infrarenal abdominal aorta is widely patent.  Bilateral common and external iliac arteries are widely patent.  Right Lower Extremity: Not evaluated  Left Lower Extremity: Left common femoral and profundofemoral artery are widely patent.  The superficial femoral artery is widely patent without stenosis.  The popliteal artery is patent throughout its course.  The dominant runoff is the anterior tibial artery which crosses the ankle however in the distal leg there are tandem lesions >72%. the peroneal artery is patent down in the lower leg.  The posterior tibial artery is occluded. Intervention: After the above images were acquired the decision was made to proceed with intervention.  A 6 French 65 cm sheath was inserted in the left superficial femoral artery and the patient was fully heparinized.  I used a Glidewire advantage to select the  anterior tibial artery.  A 3 x 60 Mustang balloon was then inserted.  It was advanced across the lesion and balloon angioplasty of lesions of the anterior tibial artery were performed at nominal  pressure for 2 minutes.  Completion imaging revealed resolution of the stenosis.  At this point catheters and wires were removed.  The groin was closed with a minx.  There are no immediate complications. Impression:  #1  No significant aortoiliac occlusive disease.  #2  No significant left leg outflow stenosis  #3  Two-vessel runoff via the anterior tibial and peroneal artery.  There is tandem stenoses within the anterior tibial artery and the lower leg, greater than 70%.  These were successfully treated with balloon angioplasty using a 3 mm balloon and no residual stenosis.  Theotis Burrow, M.D., [facility] [place] Office: [phone] Pager:  [phone]  DG Foot Complete Left  Result Date: 08/10/2020 CLINICAL DATA:  Left foot and ankle pain EXAM: LEFT FOOT - COMPLETE 3+ VIEW; LEFT ANKLE COMPLETE - 3+ VIEW COMPARISON:  08/09/2020 FINDINGS: There is extensive soft tissue swelling about the patient's foot and ankle. There is no acute displaced fracture or dislocation. Advanced vascular calcifications are noted. There is a moderate-sized plantar calcaneal spur. There are degenerative changes of the first metatarsophalangeal joint. There is a persistent erosion at the head of the proximal phalanx of the first digit. IMPRESSION: 1. No acute displaced fracture or dislocation. 2. Persistent soft tissue swelling is noted about the ankle and foot. 3. Unchanged erosion involving the head of the proximal phalanx of the first digit. 4. Persistent vascular calcifications are noted. Electronically Signed   By: Constance Holster M.D.   On: 08/10/2020 19:15        Scheduled Meds: . amoxicillin-clavulanate  1 tablet Oral Q12H  . aspirin EC  81 mg Oral Daily  . Chlorhexidine Gluconate Cloth  6  each Topical Daily  . clopidogrel  75 mg Oral Daily  . [START ON 08/19/2020] doxycycline  100 mg Oral Q12H  . ferrous sulfate  325 mg Oral Q M,W,F  . gabapentin  100 mg Oral BID  . heparin  5,000 Units Subcutaneous Q12H  . insulin aspart  0-9 Units Subcutaneous TID WC  . latanoprost  1 drop Both Eyes QHS  . linezolid  600 mg Oral Q12H  . pantoprazole  40 mg Oral QHS  . rosuvastatin  10 mg Oral Daily  . sodium chloride flush  3 mL Intravenous Q12H   Continuous Infusions: . sodium chloride 100 mL/hr at 08/11/20 0614  . sodium chloride    . lactated ringers 75 mL/hr at 08/09/20 1840     LOS: 3 days    Time spent: 35 minutes spent on chart review, discussion with nursing staff, consultants, updating family and interview/physical exam; more than 50% of that time was spent in counseling and/or coordination of care.    Alta Goding J [place] DO [facility] Available via Epic secure chat 7am-7pm After these hours, please refer to coverage provider listed on [email or web address] 08/12/2020, 1:31 PM

## 2020-08-12 NOTE — Consult Note (Signed)
Hospital Consult    Reason for Consult:  PAD Requesting Physician:  [place] MRN #:  [id number]  History of Present Illness: This is a 85 y.o. male who presented to the hospital lethargic and confused and left leg pain.  He was recently diagnosed with left big toe osteomyelitis and has been taking po abx including doxycycline and augmentin since 07/22/2020 and has been followed by wound care.  He was also having some left leg swelling and venous duplex was obtained, which revealed no evidence of DVT.    Pt has hx of HTN, CKD III, chronic diastolic CHF, DM, recent stroke, neuropathy.  He has a hx of GIB in 2012.  Pt was seen by Dr. Scot Dock in 2020 for left foot wound.  In April 2020, he had a healing venous ulcer of the left leg with underlying infrainguinal arterial occlusive disease.  He did have a toe pressure of 30mmHg and it was felt he had adequate circulation to heal the wound.  He would consider arteriogram with CO2 if the wound did not heal but fortunately, it did heal.  He was found to have CEAP C5 venous disease with moderate LLE swelling.  He discussed with him the importance of intermittent leg elevation and proper positioning.  He was given rx for 15--20 knee high compression socks.    He has hx of right BKA.  He states this was done in the 2000's and he did have a wound at that time.    [facility] is consulted for left toe osteomyelitis and PAD.  Pt states he has the wound on the left great toe that has been present for about 15 weeks.   He states that he wears his compression socks and does elevate his legs.  He does not have pain in his foot with elevation.  He does walk with a prosthesis and he does not have pain in his leg when he walks.  He also uses a wheelchair.  He denies fever but states he has had some chills.  The pt is on a statin for cholesterol management.  The pt is on a daily aspirin.   Other AC:  Plavix; sq heparin for DVT prophylaxis The pt is currently not on medication  for hypertension.   The pt is diabetic.   Tobacco hx:  Former  There is no family hx of AAA.  Past Medical History:  Diagnosis Date  . Acute metabolic encephalopathy 06/22/5091  . Amputated below knee ([place])    right  . Anemia   . Aspiration pneumonia ([place]) 11/02/2019  . Diastolic heart failure ([place])   . Diverticulosis   . DM (diabetes mellitus) ([place])   . Gout   . Hyperlipemia   . OSA on CPAP   . RBBB   . Sepsis ([place]) 11/02/2019  . Systemic hypertension     Past Surgical History:  Procedure Laterality Date  . LEG AMPUTATION BELOW KNEE     right  . NM MYOCAR PERF WALL MOTION  09/18/2009   no ischemia    Allergies  Allergen Reactions  . Vancomycin Rash    Prior to Admission medications   Medication Sig Start Date End Date Taking? Authorizing Provider  amoxicillin-clavulanate (AUGMENTIN) 875-125 MG tablet Take 1 tablet by mouth 2 (two) times daily. Started on 07-23-19 for 6 days. Unknown per family if he finished course of therapy.   Yes [provider]  aspirin EC 81 MG tablet Take 81 mg by mouth daily. Swallow whole.  Yes [provider]  Biotin 5000 MCG TABS Take 5,000 mcg by mouth daily.   Yes [provider]  [name] Apply between toes as needed for maceration. 08/08/20  Yes [name], DPM  clopidogrel (PLAVIX) 75 MG tablet Take 1 tablet (75 mg total) by mouth daily. 07/23/20 08/22/20 Yes Donne Hazel, MD  colchicine 0.6 MG tablet Take 0.6 mg by mouth daily as needed (gout flare up).   Yes [provider]  Continuous Blood Gluc Sensor (FREESTYLE LIBRE 14 DAY SENSOR) MISC Use as directed to check blood sugars 05/13/20  Yes [name], [name]  doxycycline (VIBRAMYCIN) 100 MG capsule Take 100 mg by mouth 2 (two) times daily. For 6 days. Patient was supposed to start on 07-25-19 however patient picked up RX on 08-04-19. Unknown how many doses have been took.   Yes [provider]  ferrous sulfate 325  (65 FE) MG tablet Take 325 mg by mouth every Monday, Wednesday, and Friday.   Yes [provider]  fish oil-omega-3 fatty acids 1000 MG capsule Take 2 g by mouth daily.   Yes [provider]  furosemide (LASIX) 40 MG tablet Take 2 tabs in morning and 1 tab evening Patient taking differently: Take 40-80 mg by mouth 2 (two) times daily. 80 mg in the morning and 40 mg at night 01/23/20  Yes [name], [name]  gabapentin (NEURONTIN) 100 MG capsule Take 1 capsule by mouth 3 times a day Patient taking differently: Take 100-200 mg by mouth 2 (two) times daily. 200 mg in the morning and 100 mg at bedtime. Total dose of 300 mg 07/15/20  Yes [name], [name]  glucose blood (FREESTYLE PRECISION NEO TEST) test strip Use as instructed 07/30/19  Yes [name], [name]  hydrocortisone (ANUSOL-HC) 25 MG suppository Place 25 mg rectally 2 (two) times daily as needed for hemorrhoids or anal itching.   Yes [provider]  latanoprost (XALATAN) 0.005 % ophthalmic solution Place 1 drop into both eyes at bedtime.  10/12/18  Yes [provider]  Multiple Vitamin (MULTIVITAMIN WITH MINERALS) TABS tablet Take 1 tablet by mouth daily.   Yes [provider]  pantoprazole (PROTONIX) 40 MG tablet Take 1 tablet  by mouth daily. Patient taking differently: Take 40 mg by mouth at bedtime. 07/15/20  Yes [name], [name]  Polyvinyl Alcohol-Povidone (REFRESH OP) Place 1 drop into both eyes daily as needed (dry eyes).   Yes [provider]  PRESCRIPTION MEDICATION Inhale into the lungs See admin instructions. CPAP- At bedtime   Yes [provider]  Semaglutide, [name], (OZEMPIC, [name],) 2 MG/1.5ML SOPN Inject 1 mg into the skin once a week. Patient taking differently: Inject 1 mg into the skin every Wednesday. 09/24/19  Yes [name], [name]  rosuvastatin (CRESTOR) 10 MG tablet Take 1 tablet (10 mg total) by mouth daily. Patient taking differently: Take 10 mg by mouth  every 7 (seven) days. Wednesday of each week 11/07/19   Bonnielee Haff, MD    Social History   Socioeconomic History  . Marital status: Married    Spouse name: Not on file  . Number of children: Not on file  . Years of education: Not on file  . Highest education level: Not on file  Occupational History  . Occupation: retired  Tobacco Use  . Smoking status: Former Smoker    Types: Cigars    Quit date: 06/12/2001    Years since  quitting: 19.1  . Smokeless tobacco: Never Used  Vaping Use  . Vaping Use: Former  Substance and Sexual Activity  . Alcohol use: No    Alcohol/week: 0.0 standard drinks  . Drug use: No  . Sexual activity: Not Currently  Other Topics Concern  . Not on file  Social History Narrative  . Not on file   Social Determinants of Health   Financial Resource Strain: Low Risk   . Difficulty of Paying Living Expenses: Not hard at all  Food Insecurity: No Food Insecurity  . Worried About Charity fundraiser in the Last Year: Never true  . Ran Out of Food in the Last Year: Never true  Transportation Needs: No Transportation Needs  . Lack of Transportation (Medical): No  . Lack of Transportation (Non-Medical): No  Physical Activity: Inactive  . Days of Exercise per Week: 0 days  . Minutes of Exercise per Session: 0 min  Stress: No Stress Concern Present  . Feeling of Stress : Not at all  Social Connections: Not on file  Intimate Partner Violence: Not on file     Family History  Problem Relation Age of Onset  . Diabetes Mother   . Heart attack Father   . Cancer Sister     ROS: [x]  Positive   [ ]  Negative   [ ]  All sytems reviewed and are negative  Cardiac: [x]  heart failure  Vascular: []  pain in legs while walking []  pain in legs at rest []  pain in legs at night [x]  non-healing ulcers []  hx of DVT [x]  swelling in legs  Pulmonary: []  asthma/wheezing []  home O2  Neurologic: []  hx of CVA []  mini stroke   Hematologic: []  hx of  cancer  Endocrine:   [x]  diabetes []  thyroid disease  GI [x]  hx GIB   GU: [x]  CKD/renal failure []  HD--[]  M/W/F or []  T/T/S  Psychiatric: []  anxiety []  depression  Musculoskeletal: []  arthritis []  joint pain  Integumentary: []  rashes []  ulcers  Constitutional: []  fever  [x]  chills  Physical Examination  Vitals:   08/10/20 0839 08/10/20 1100  BP: (!) 104/51 (!) 121/54  Pulse: 87 87  Resp: 19 20  Temp: 98 F (36.7 C) 97.8 F (36.6 C)  SpO2: 99% 99%   Body mass index is 36.94 kg/m.  General:  WDWN in NAD Gait: Not observed HENT: WNL, normocephalic Pulmonary: normal non-labored breathing Cardiac: regular, without Murmur; without carotid bruits Abdomen: obese, soft, NT/ND; aortic pulse is not palpable Skin: without rashes Vascular Exam/Pulses:  Right Left  Radial 2+ (normal) 2+ (normal)  Femoral Unable to palpate due to body habitus  Unable to palpate due to body habitus  DP BKA monophasic  PT BKA absent  Peroneal BKA monophasic   Extremities: LLE swelling; left great toe wound       Musculoskeletal: no muscle wasting or atrophy  Neurologic: A&O X 3; speech is fluent/normal Psychiatric:  The pt has Normal affect.  CBC    Component Value Date/Time   WBC 12.1 (H) 08/10/2020 0059   RBC 2.92 (L) 08/10/2020 0059   HGB 9.5 (L) 08/10/2020 0059   HGB 10.8 (L) 01/23/2020 1454   HCT 27.4 (L) 08/10/2020 0059   HCT 32.8 (L) 01/23/2020 1454   PLT 205 08/10/2020 0059   PLT 146 (L) 01/23/2020 1454   MCV 93.8 08/10/2020 0059   MCV 91 01/23/2020 1454   MCH 32.5 08/10/2020 0059   MCHC 34.7 08/10/2020 0059   RDW 16.9 (H)  08/10/2020 0059   RDW 14.3 01/23/2020 1454   LYMPHSABS 0.6 (L) 08/09/2020 0858   LYMPHSABS 1.8 03/15/2018 1022   MONOABS 1.0 08/09/2020 0858   EOSABS 0.0 08/09/2020 0858   EOSABS 0.2 03/15/2018 1022   BASOSABS 0.0 08/09/2020 0858   BASOSABS 0.0 03/15/2018 1022    BMET    Component Value Date/Time   NA 142 08/10/2020 0059   NA  140 05/20/2020 0951   K 3.8 08/10/2020 0059   CL 111 08/10/2020 0059   CO2 19 (L) 08/10/2020 0059   GLUCOSE 109 (H) 08/10/2020 0059   BUN 86 (H) 08/10/2020 0059   BUN 50 (H) 05/20/2020 0951   CREATININE 2.03 (H) 08/10/2020 0059   CALCIUM 9.0 08/10/2020 0059   GFRNONAA 32 (L) 08/10/2020 0059   GFRAA 42 (L) 05/20/2020 0951    COAGS: Lab Results  Component Value Date   INR 1.2 08/09/2020   INR 1.2 07/18/2020   INR 1.1 01/17/2020     Non-Invasive Vascular Imaging:   LLE venous duplex 08/09/2020: RIGHT:  - No evidence of common femoral vein obstruction.  LEFT:  - There is no evidence of deep vein thrombosis in the lower extremity.    - Ultrasound characteristics of enlarged lymph nodes noted in the groin.  interstitial fluid noted throughout calf.  ABI 07/19/2020: Left is [place]  Carotid duplex 07/19/2020 1-39% bilateral ICA stenosis   ASSESSMENT/PLAN: This is a 85 y.o. male with left great toe wound   -pt has hx of mixed PAD with venous disease.  He does not have rest pain or claudication.   His doppler signals are monophasic in the left DP/peroneal.  He has had this wound for about 15 weeks.  He has known renal insufficiency and his creatinine is 2.03.   He was seen by Dr. Scot Dock in 2020 for a venous wound that was non healing but this did subsequently heal.  He had discussed doing arteriogram with CO2, which he is probably going to need for this toe wound.  Dr. Scot Dock had also prescribed 15-20 mmHg knee high compression, which he wears during the day and elevates his leg.   -discussed padding his foot so that he does not have pressure on it from the foot of the bed.    Dr. Donzetta Matters will be by to evaluate the pt later today.  Continue asa/statin/plavix   Leontine Locket, PA-C [facility] [phone]  I have independently interviewed and examined patient and agree with PA assessment and plan above.  Easily palpable left popliteal pulse consistent with  previous duplex suggesting tibial disease.  Plan for angiography and will need CO2 tomorrow.  N.p.o. past midnight  Brandon C. Donzetta Matters, MD [facility] [place] Office: [phone] Pager: [phone]

## 2020-08-12 NOTE — Progress Notes (Signed)
Patient sitting up in bed watching tv upon my arrival.  Patient is in stable condition and states no pain at this time.  Dressing to left great toe is clean, dry, and intact.  Urinary catheter site is clean, dry, and intact. Urine is clear and yellow/straw color.  Catheter bag emptied with 550 ml output. Vitals stable.  Client stated he does not need anything at this time.  Will continue to follow prescribed orders.

## 2020-08-12 NOTE — Evaluation (Signed)
Physical Therapy Evaluation Patient Details Name: Micheal Walls MRN: [id number] DOB: 1935/10/28 Today's Date: 08/12/2020   History of Present Illness  Micheal Walls is a 85 y.o. male with medical history significant of HTN, CKD stage III, chronic diastolic CHF, IIDM, recent stroke, recent left great toe osteomyelitis on long-term p.o. antibiotics Augmentin and doxycycline, diabetic neuropathy, presented with increasing left leg pain and altered mentations.  Clinical Impression  Pt admitted with/for L LE cellulitis with pain/ osteomyelitis.  Pt needing minimal assist in the hospital environment with less than optimal simulation of pt's home environment and equipment.  Pt does report not quite at his baseline function..  Pt currently limited functionally due to the problems listed below.  (see problems list.)  Pt will benefit from PT to maximize function and safety to be able to get home safely with available assist.     Follow Up Recommendations Home health PT    Equipment Recommendations  None recommended by PT    Recommendations for Other Services       Precautions / Restrictions Precautions Precautions: Fall Precaution Comments: R BKA with prosthesis, h/o old R CVA with mild L weakness      Mobility  Bed Mobility               General bed mobility comments: OOB in chair on arrival    Transfers Overall transfer level: Needs assistance Equipment used: Rolling walker (2 wheeled) Transfers: Sit to/from [facility] Sit to Stand: Min assist;Mod assist (mod from lower surface) Stand pivot transfers: Min assist;From elevated surface       General transfer comment: slow, guarded pivot with RW and stability assist.  Unable to simulate heights for transfers to power w/c  Ambulation/Gait             General Gait Details: pivot chair to/from bed with prosthesis and RW  Stairs            Wheelchair Mobility    Modified Rankin (Stroke  Patients Only) Modified Rankin (Stroke Patients Only) Pre-Morbid Rankin Score: Moderately severe disability Modified Rankin: Moderately severe disability     Balance Overall balance assessment: Needs assistance Sitting-balance support: Feet supported;No upper extremity supported Sitting balance-Leahy Scale: Fair     Standing balance support: Bilateral upper extremity supported Standing balance-Leahy Scale: Poor Standing balance comment: reliant on BUE support                             Pertinent Vitals/Pain Pain Assessment: No/denies pain    Home Living Family/patient expects to be discharged to:: Private residence Living Arrangements: Spouse/significant other;Children Available Help at Discharge: Family;Available 24 hours/day Type of Home: House Home Access: Ramped entrance     Home Layout: One level Home Equipment: Wheelchair - Insurance claims handler - standard;Wheelchair - power Additional Comments: pt lives with wife who can provide supervision and grandson who drives them to morning appts but then goes to work    Prior Function Level of Independence: Independent with assistive device(s)               Hand Dominance   Dominant Hand: Right    Extremity/Trunk Assessment   Upper Extremity Assessment LUE Deficits / Details: Residual L hemiparesis from previous CVA but generally functional    Lower Extremity Assessment RLE Deficits / Details: hipflexion/knee ext 3/5 RLE Sensation: decreased proprioception RLE Coordination: decreased gross motor LLE Deficits / Details: hip flexion 2+, knee ext  3/5 LLE Coordination: decreased gross motor       Communication   Communication: HOH  Cognition Arousal/Alertness: Awake/alert Behavior During Therapy: WFL for tasks assessed/performed Overall Cognitive Status: Within Functional Limits for tasks assessed                                        General Comments      Exercises      Assessment/Plan    PT Assessment Patient needs continued PT services  PT Problem List Decreased strength;Decreased activity tolerance;Decreased balance;Decreased mobility;Decreased coordination;Decreased knowledge of use of DME       PT Treatment Interventions DME instruction;Gait training;Functional mobility training;Therapeutic activities;Balance training;Patient/family education    PT Goals (Current goals can be found in the Care Plan section)  Acute Rehab PT Goals Patient Stated Goal: get home PT Goal Formulation: With patient Time For Goal Achievement: 08/26/20 Potential to Achieve Goals: Good    Frequency Min 4X/week   Barriers to discharge        Co-evaluation               AM-PAC PT "6 Clicks" Mobility  Outcome Measure Help needed turning from your back to your side while in a flat bed without using bedrails?: None Help needed moving from lying on your back to sitting on the side of a flat bed without using bedrails?: None Help needed moving to and from a bed to a chair (including a wheelchair)?: A Little Help needed standing up from a chair using your arms (e.g., wheelchair or bedside chair)?: A Little Help needed to walk in hospital room?: A Little Help needed climbing 3-5 steps with a railing? : Total 6 Click Score: 18    End of Session   Activity Tolerance: Patient tolerated treatment well Patient left: in chair;with call bell/phone within reach;with chair alarm set Nurse Communication: Mobility status PT Visit Diagnosis: Muscle weakness (generalized) (M62.81);Other abnormalities of gait and mobility (R26.89)    Time: 2761-4709 PT Time Calculation (min) (ACUTE ONLY): 32 min   Charges:   PT Evaluation $PT Eval Moderate Complexity: 1 Mod PT Treatments $Therapeutic Activity: 8-22 mins        08/12/2020  Ginger Carne., PT Acute Rehabilitation Services [phone]  (pager) [phone]  (office)  Tessie Fass [name] 08/12/2020, 4:08 PM

## 2020-08-12 NOTE — Progress Notes (Signed)
Dressing to left great toe changed as ordered. Will monitor. Alyona Romack, Bettina Gavia RN

## 2020-08-12 NOTE — Progress Notes (Signed)
Patient assisted to get into chair after bath, patient placed on prosthesis to right lower extremity and using steady and nursing staff patient placed in recliner. Call bell with in reach. Will monitor patient.Jerrel Tiberio, Bettina Gavia  rN

## 2020-08-12 NOTE — Progress Notes (Signed)
Patient sitting up in the chair watching tv and states he does not have any pain at this time.  Vital signs checked and stable. Patient states he does not need anything.  Patient up with Physical Therapy while in the room and tolerated well.

## 2020-08-12 NOTE — Progress Notes (Signed)
  Subjective:  Patient ID: Micheal Walls, male    DOB: [birth year],  MRN: [id number]   Feeling a bit better, pain improving  Negative for chest pain and shortness of breath Objective:   Vitals:   08/12/20 1711 08/12/20 2043  BP: (!) 155/86 (!) 147/71  Pulse: 93 90  Resp: 18 18  Temp: (!) 97.2 F (36.2 C) 97.8 F (36.6 C)  SpO2: 97% 99%   General AA&O x3. Normal mood and affect.  Vascular Foot is now WWP, capillary fill time intact  Neurologic Epicritic sensation grossly asbent.  Dermatologic Ulcer stable, clean and dressed. Cellulitis appears now to be more of a ?petechial rash?  Orthopedic: MMT 5/5       Assessment & Plan:  Patient was evaluated and treated and all questions answered.  ?cellulitis vs PAD -Today seems more to be closely resembling a petechial rash. Unclear if this is evolving from his PAD, venous insufficiency -Ulcer remains benign appearing -Per ID, plan to continue abx -Family is reticent to discharge since he has had this recurrent issue -We discussed possibility of toe amputation for definitive surgical management of OM.  -I recommend we allow the antibiotics to work further now that he has been revascularized. If it recurs, or toe worsens then toe amputation as outpatient -Discussed with son and daughter  [name], DPM  Accessible via secure chat for questions or concerns.

## 2020-08-12 NOTE — Progress Notes (Signed)
[facility]  Date of Admission:  08/09/2020      Abx: 2/27-c linezolid 2/27-c cefepime  Previously doxy/augmentin since 07/22/20                                                      Assessment: 85 yo male pvd, dm2, s/p right bka, chronic left hallux ulcer recently admitted 2/06 for stroke, with mri left foot showing possible osteomyelitis hallux (no cx/podiatry intervention at that time), placed on abx doxy/augmentin empiric left hallux om, but readmitted for sepsis with cellulitic changes around the left toe despite on abx  #Severe sepsis with aki improved #cellulitis left LE #hx left hallux OM Repeat mri left foot this admission actually suggest improving om changes of the left hallux. Clinically it also doensn't appear the hallux OM is doing worse. the cellulitic region is rather away from the ischemic toe. Surprised it occurs on abx; ?mrsa not amenable to doxy Previously planned 6 weeks doxy/amox-clav from 2/09 2/27 bcx ngtd Clinically improving  #pvd Angiogram done with vascular surgeryon 3/01; s/p angioplasty LLE anterior tibialis   Plan: 1. continue linezolid and amox-clav  2. Can resume doxy/amox-clav on 3/09, until 08/29/2020 3. Id f/u as below 4. Id will sign off   Clinic Follow Up Appt: 3/17 @ 230 with dr Bob Eastwood  @  [facility] [id number], [place], [place] [address] Phone: [phone]   Principal Problem:   Severe sepsis [facility]) Active Problems:   S/P unilateral BKA (below knee amputation) ([place])   Mixed hyperlipidemia   Essential hypertension   Anemia, iron deficiency   CKD (chronic kidney disease) stage 4, GFR 15-29 ml/min (HCC)   History of stroke   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HCC)   Left ankle swelling   Scheduled Meds: . amoxicillin-clavulanate  1 tablet Oral Q12H  . aspirin EC  81 mg Oral Daily  . Chlorhexidine Gluconate Cloth  6 each Topical Daily  . clopidogrel  75 mg Oral Daily  . ferrous  sulfate  325 mg Oral Q M,W,F  . gabapentin  100 mg Oral BID  . heparin  5,000 Units Subcutaneous Q12H  . insulin aspart  0-9 Units Subcutaneous TID WC  . latanoprost  1 drop Both Eyes QHS  . linezolid  600 mg Oral Q12H  . pantoprazole  40 mg Oral QHS  . rosuvastatin  10 mg Oral Daily  . sodium chloride flush  3 mL Intravenous Q12H   Continuous Infusions: . sodium chloride 100 mL/hr at 08/11/20 0614  . sodium chloride    . lactated ringers 75 mL/hr at 08/09/20 1840   PRN Meds:.sodium chloride, acetaminophen, hydrALAZINE, hydrALAZINE, HYDROmorphone (DILAUDID) injection, labetalol, ondansetron (ZOFRAN) IV, ondansetron **OR** [DISCONTINUED] ondansetron (ZOFRAN) IV, senna-docusate, sodium chloride flush   SUBJECTIVE: Angio done 3/01; reviewed Swelling/pain improving left distal tibia No n/v/diarrhea afebrile  Review of Systems: ROS Other ros negative  Allergies  Allergen Reactions  . Vancomycin Rash    OBJECTIVE: Vitals:   08/12/20 0700 08/12/20 0800 08/12/20 1000 08/12/20 1101  BP: 138/71 (!) 135/95 132/65 138/68  Pulse: 83 84 89 90  Resp: (!) $RemoveB'22 20 20 19  'aaxjIgDJ$ Temp: 98.2 F (36.8 C)   97.6 F (36.4 C)  TempSrc: Oral   Oral  SpO2: 98% 98% 99%  100%  Weight:      Height:       Body mass index is 36.94 kg/m.  Physical Exam No distress, conversant Heent: normocephalic; per; conj clear; eomi Neck supple cv rrr no mrg Lungs clear; normal respiratory effort abd s/nt Ext improved 1 plus edema LLE; right bka stump nontender Skin improved vessicular and tender changes in skin distal LLE wrinkling skin noted --> overall continues to improve today msk no synovitis; the distal part of the left hallux appears to have dry gangrene without discharge/redness/swelling Neuro nonfocal; alert/oriented Psych mood normal  Lab Results Lab Results  Component Value Date   WBC 14.2 (H) 08/12/2020   HGB 9.6 (L) 08/12/2020   HCT 28.1 (L) 08/12/2020   MCV 92.7 08/12/2020   PLT 171  08/12/2020    Lab Results  Component Value Date   CREATININE 1.42 (H) 08/12/2020   BUN 48 (H) 08/12/2020   NA 141 08/12/2020   K 4.2 08/12/2020   CL 113 (H) 08/12/2020   CO2 20 (L) 08/12/2020    Lab Results  Component Value Date   ALT 20 08/09/2020   AST 28 08/09/2020   ALKPHOS 42 08/09/2020   BILITOT 0.9 08/09/2020     Microbiology: Recent Results (from the past 240 hour(s))  Resp Panel by RT-PCR (Flu A&B, Covid) Nasopharyngeal Swab     Status: None   Collection Time: 08/09/20  9:26 AM   Specimen: Nasopharyngeal Swab; Nasopharyngeal(NP) swabs in vial transport medium  Result Value Ref Range Status   SARS Coronavirus 2 by RT PCR NEGATIVE NEGATIVE Final    Comment: (NOTE) SARS-CoV-2 target nucleic acids are NOT DETECTED.  The SARS-CoV-2 RNA is generally detectable in upper respiratory specimens during the acute phase of infection. The lowest concentration of SARS-CoV-2 viral copies this assay can detect is 138 copies/mL. A negative result does not preclude SARS-Cov-2 infection and should not be used as the sole basis for treatment or other patient management decisions. A negative result may occur with  improper specimen collection/handling, submission of specimen other than nasopharyngeal swab, presence of viral mutation(s) within the areas targeted by this assay, and inadequate number of viral copies(<138 copies/mL). A negative result must be combined with clinical observations, patient history, and epidemiological information. The expected result is Negative.  Fact Sheet for Patients:  EntrepreneurPulse.com.au  Fact Sheet for Healthcare Providers:  IncredibleEmployment.be  This test is no t yet approved or cleared by the [place] FDA and  has been authorized for detection and/or diagnosis of SARS-CoV-2 by FDA under an Emergency Use Authorization (EUA). This EUA will remain  in effect (meaning this test can be used) for the  duration of the COVID-19 declaration under Section 564(b)(1) of the Act, 21 U.S.C.section 360bbb-3(b)(1), unless the authorization is terminated  or revoked sooner.       Influenza A by PCR NEGATIVE NEGATIVE Final   Influenza B by PCR NEGATIVE NEGATIVE Final    Comment: (NOTE) The Xpert Xpress SARS-CoV-2/FLU/RSV plus assay is intended as an aid in the diagnosis of influenza from Nasopharyngeal swab specimens and should not be used as a sole basis for treatment. Nasal washings and aspirates are unacceptable for Xpert Xpress SARS-CoV-2/FLU/RSV testing.  Fact Sheet for Patients: EntrepreneurPulse.com.au  Fact Sheet for Healthcare Providers: IncredibleEmployment.be  This test is not yet approved or cleared by the [place] FDA and has been authorized for detection and/or diagnosis of SARS-CoV-2 by FDA under an Emergency Use Authorization (EUA). This EUA will remain in  effect (meaning this test can be used) for the duration of the COVID-19 declaration under Section 564(b)(1) of the Act, 21 U.S.C. section 360bbb-3(b)(1), unless the authorization is terminated or revoked.  Performed at [facility], [place] [address], [place], [place] [address]   Blood Culture (routine x 2)     Status: None (Preliminary result)   Collection Time: 08/09/20  9:26 AM   Specimen: BLOOD  Result Value Ref Range Status   Specimen Description BLOOD RIGHT ANTECUBITAL  Final   Special Requests   Final    BOTTLES DRAWN AEROBIC AND ANAEROBIC Blood Culture adequate volume   Culture   Final    NO GROWTH 3 DAYS Performed at [facility], [place] [address], [place], [place] [address]    Report Status PENDING  Incomplete  Blood Culture (routine x 2)     Status: None (Preliminary result)   Collection Time: 08/09/20  9:31 AM   Specimen: BLOOD  Result Value Ref Range Status   Specimen Description BLOOD RIGHT ANTECUBITAL  Final   Special Requests   Final    BOTTLES  DRAWN AEROBIC AND ANAEROBIC Blood Culture adequate volume   Culture   Final    NO GROWTH 3 DAYS Performed at [facility], [place] [address], [place], [place] [address]    Report Status PENDING  Incomplete  Urine culture     Status: None   Collection Time: 08/09/20 11:59 AM   Specimen: In/Out Cath Urine  Result Value Ref Range Status   Specimen Description IN/OUT CATH URINE  Final   Special Requests NONE  Final   Culture   Final    NO GROWTH Performed at [facility], [place] [address], [place], [place] [address]    Report Status 08/10/2020 FINAL  Final  Surgical PCR screen     Status: None   Collection Time: 08/11/20  2:00 AM   Specimen: Nasal Mucosa; Nasal Swab  Result Value Ref Range Status   MRSA, PCR NEGATIVE NEGATIVE Final   Staphylococcus aureus NEGATIVE NEGATIVE Final    Comment: (NOTE) The Xpert SA Assay (FDA approved for NASAL specimens in patients 68 years of age and older), is one component of a comprehensive surveillance program. It is not intended to diagnose infection nor to guide or monitor treatment. Performed at [facility], [place] [address] [place],  [address]     Serology:  Imaging: If present, new imagings (plain films, ct scans, and mri) have been personally visualized and interpreted; radiology reports have been reviewed. Decision making incorporated into the Impression / Recommendations.  2/28 mri left foot 1. Reduction in edema signal in the middle and distal phalanges of the great toe likely reflecting improvement in osteomyelitis. 2. Stable medial erosion of the head of the proximal phalanx great toe. 3. Dorsal subcutaneous edema in the forefoot, similar to previous laterally but with some increased dorsal edema medially compared to previous.  Jabier Mutton, [facility] [place] [phone] pager    08/12/2020, 11:25 AM

## 2020-08-13 ENCOUNTER — Other Ambulatory Visit: Payer: Self-pay | Admitting: Internal Medicine

## 2020-08-13 LAB — GLUCOSE, CAPILLARY
Glucose-Capillary: 104 mg/dL — ABNORMAL HIGH (ref 70–99)
Glucose-Capillary: 162 mg/dL — ABNORMAL HIGH (ref 70–99)

## 2020-08-13 LAB — BASIC METABOLIC PANEL
Anion gap: 7 (ref 5–15)
BUN: 34 mg/dL — ABNORMAL HIGH (ref 8–23)
CO2: 21 mmol/L — ABNORMAL LOW (ref 22–32)
Calcium: 9.7 mg/dL (ref 8.9–10.3)
Chloride: 112 mmol/L — ABNORMAL HIGH (ref 98–111)
Creatinine, Ser: 1.24 mg/dL (ref 0.61–1.24)
GFR, Estimated: 58 mL/min — ABNORMAL LOW (ref >60)
Glucose, Bld: 119 mg/dL — ABNORMAL HIGH (ref 70–99)
Potassium: 4.3 mmol/L (ref 3.5–5.1)
Sodium: 140 mmol/L (ref 135–145)

## 2020-08-13 LAB — CBC
HCT: 28.9 % — ABNORMAL LOW (ref 39.0–52.0)
Hemoglobin: 9.6 g/dL — ABNORMAL LOW (ref 13.0–17.0)
MCH: 30.8 pg (ref 26.0–34.0)
MCHC: 33.2 g/dL (ref 30.0–36.0)
MCV: 92.6 fL (ref 80.0–100.0)
Platelets: 144 10*3/uL — ABNORMAL LOW (ref 150–400)
RBC: 3.12 MIL/uL — ABNORMAL LOW (ref 4.22–5.81)
RDW: 17.1 % — ABNORMAL HIGH (ref 11.5–15.5)
WBC: 8.2 10*3/uL (ref 4.0–10.5)
nRBC: 0 % (ref 0.0–0.2)

## 2020-08-13 LAB — C-REACTIVE PROTEIN: CRP: 16.1 mg/dL — ABNORMAL HIGH (ref <1.0)

## 2020-08-13 MED ORDER — DOXYCYCLINE HYCLATE 100 MG PO TABS: 100.0000 mg | ORAL_TABLET | Freq: Two times a day (BID) | ORAL | 0 refills | Status: AC

## 2020-08-13 MED ORDER — LINEZOLID 600 MG PO TABS: 600.0000 mg | ORAL_TABLET | Freq: Two times a day (BID) | ORAL | 0 refills | Status: AC

## 2020-08-13 MED ORDER — AMOXICILLIN-POT CLAVULANATE 875-125 MG PO TABS: 1.0000 | ORAL_TABLET | Freq: Two times a day (BID) | ORAL | 0 refills | Status: AC

## 2020-08-13 MED FILL — LINEZOLID 600 MG TABS: 600 | 7 days supply | Qty: 14 | Fill #0

## 2020-08-13 MED FILL — DOXYCYCLINE HYCLATE 100 MG: 100 | 9 days supply | Qty: 18 | Fill #0

## 2020-08-13 MED FILL — AMOX-CLAV 875-125 MG TABLET: 875-125 | 16 days supply | Qty: 32 | Fill #0

## 2020-08-13 NOTE — Progress Notes (Signed)
Dressing to left great toe as ordered. Will continue to monitor.

## 2020-08-13 NOTE — Progress Notes (Signed)
Patient had voided post foley removal. 184ml. Will monitor patient. Felder Lebeda, Bettina Gavia RN

## 2020-08-13 NOTE — Evaluation (Signed)
Occupational Therapy Evaluation Patient Details Name: Micheal Walls MRN: [id number] DOB: 1935/11/02 Today's Date: 08/13/2020    History of Present Illness Micheal Walls is a 85 y.o. male with medical history significant of HTN, CKD stage III, chronic diastolic CHF, IIDM, recent stroke, recent left great toe osteomyelitis on long-term p.o. antibiotics Augmentin and doxycycline, diabetic neuropathy, presented with increasing left leg pain and altered mentations.   Clinical Impression   Patient admitted for the diagnosis above.  He is presenting at or near his baseline function for ADL and toileting.  He generally completes stand pivot transfers at home to his power wheelchair.  He has assist as needed via spouse and family.  No follow up OT indicated, he is planning on discharge home this date.      Follow Up Recommendations  No OT follow up    Equipment Recommendations  None recommended by OT    Recommendations for Other Services       Precautions / Restrictions Precautions Precaution Comments: R BKA with prosthesis, h/o old R CVA with mild L weakness Other Brace: prosthetic in room for RLE Restrictions Weight Bearing Restrictions: No      Mobility Bed Mobility               General bed mobility comments: OOB in chair on arrival    Transfers Overall transfer level: Needs assistance Equipment used: Rolling walker (2 wheeled) Transfers: Sit to/from [facility] Sit to Stand: Min assist;Mod assist Stand pivot transfers: Min assist;From elevated surface            Balance Overall balance assessment: Needs assistance Sitting-balance support: Feet supported;No upper extremity supported Sitting balance-Leahy Scale: Good     Standing balance support: Bilateral upper extremity supported Standing balance-Leahy Scale: Poor Standing balance comment: reliant on BUE support                           ADL either performed or assessed with  clinical judgement   ADL Overall ADL's : At baseline                                       General ADL Comments: patient has assist as needed for ADL and IADL.  Generally completes his own ADL seated with increased tme and effort.     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 but generally functional   Lower Extremity Assessment Lower Extremity Assessment: Defer to PT evaluation   Cervical / Trunk Assessment Cervical / Trunk Assessment: Normal   Communication Communication Communication: HOH   Cognition Arousal/Alertness: Awake/alert Behavior During Therapy: WFL for tasks assessed/performed Overall Cognitive Status: Within Functional Limits for tasks assessed                                       Home Living Family/patient expects to be discharged to:: Private residence Living Arrangements: Spouse/significant  other;Children Available Help at Discharge: Family;Available 24 hours/day Type of Home: House Home Access: Ramped entrance     Home Layout: One level     Bathroom Shower/Tub: Occupational psychologist: Handicapped height Bathroom Accessibility: Yes How Accessible: Accessible via wheelchair Home Equipment: Wheelchair - manual;Shower seat;Walker - standard;Wheelchair - power   Additional Comments: pt lives with wife who can provide supervision and grandson who drives them to morning appts but then goes to work  Lives With: Spouse    Prior Functioning/Environment Level of Independence: Independent with assistive device(s)                  OT Problem List: Decreased strength;Impaired balance (sitting and/or standing)      OT Treatment/Interventions: Self-care/ADL training;Therapeutic exercise;Energy conservation;DME and/or AE instruction;Therapeutic activities;Patient/family education;Balance training    OT Goals(Current goals can be found in the care plan section) Acute Rehab OT Goals Patient Stated Goal: Patient states he is going home today. OT Goal Formulation: With patient Time For Goal Achievement: 08/13/20 Potential to Achieve Goals: Good  OT Frequency:     Barriers to D/C:  none noted          Co-evaluation              AM-PAC OT "6 Clicks" Daily Activity     Outcome Measure Help from another person eating meals?: None Help from another person taking care of personal grooming?: A Little Help from another person toileting, which includes using toliet, bedpan, or urinal?: A Little Help from another person bathing (including washing, rinsing, drying)?: A Little Help from another person to put on and taking off regular upper body clothing?: None Help from another person to put on and taking off regular lower body clothing?: A Little 6 Click Score: 20   End of Session Equipment Utilized During Treatment: Rolling walker  Activity Tolerance: Patient tolerated treatment well Patient left: in chair;with call bell/phone within reach;with chair alarm set;with nursing/sitter in room  OT Visit Diagnosis: Unsteadiness on feet (R26.81);Muscle weakness (generalized) (M62.81)                Time: 1030-1050 OT Time Calculation (min): 20 min Charges:  OT General Charges $OT Visit: 1 Visit OT Evaluation $OT Eval Moderate Complexity: 1 Mod  08/13/2020  Rich, OTR/L  Acute Rehabilitation Services  Office:  [phone]   Metta Clines 08/13/2020, 10:55 AM

## 2020-08-13 NOTE — Discharge Summary (Signed)
Physician Discharge Summary  Micheal Walls XQJ:[id number] DOB: 09/01/1935 DOA: 08/09/2020  PCP: [name], [name]  Admit date: 08/09/2020 Discharge date: 08/13/2020  Admitted From: Home Disposition: Home  Recommendations for Outpatient Follow-up:  1. Follow up with PCP in 1-2 weeks 2. Follow-up with vascular surgery in 3-4 weeks for repeat lower extremity duplex ultrasound with ABI 3. Follow-up with podiatry as scheduled 4. Follow-up with infectious disease, Dr. Gale Journey on 3/17 5. Continue antibiotics with Zyvox/Augmentin until 08/19/2020 followed by doxycycline/Augmentin until 08/27/2020 6. Please obtain BMP/CBC in one week  Home Health: PT Equipment/Devices: None  Discharge Condition: Stable CODE STATUS: Full code Diet recommendation:   History of present illness:  Micheal Walls is a 85 year old male with past medical history significant for essential hypertension, CKD stage IIIb, chronic diastolic congestive heart failure, type 2 diabetes mellitus, history of recent CVA, recent left great toe osteomyelitis on oral antibiotics with Augmentin/doxycycline outpatient, diabetic neuropathy who presented to the ED with progressive left lower extremity pain, swelling, redness and confusion. Patient history was obtained by his brother who is present at bedside. Recently diagnosed with left great toe osteomyelitis and on oral antibiotics with doxycycline/Augmentin since 07/22/2020. Patient also follows with wound care outpatient. Brother concerned about new redness to the left shin and worsening pain.  In the ED, temperature 98.5 F, HR 111, RR 20, BP 115/55, SPO2 90% on 2 L nasal cannula. Sodium 140, potassium 3.9, chloride 108, CO2 17, glucose 175, BUN 104, creatinine 2.5, AST 28, ALT 20, total bilirubin 0.9. CK 362, high-sensitivity troponin 112>114. Lactic acid 4.1. WBC 9.9, hemoglobin 10.0, platelets 219. INR 1.2. Covid-19/influenza A/B PCR negative. Urinalysis unrevealing. CT head with no  definitive acute intracranial normality with revisualization of sequela of remote prior infarction pons and bilateral cerebellum. Blood cultures x2 and urine culture: Pending. Patient referred by EDP for admission for further evaluation and management of confusion secondary to severe sepsis from left lower extremity cellulitis/great toe osteomyelitis.  Hospital course:  Severe sepsis, POA Left great toe osteomyelitis Patient presenting to the ED with progressive left lower extremity edema associated with pain and redness. Recently discharged with outpatient antibiotics for treatment of left great toe osteomyelitis. Patient with elevated lactic acid of 4.1. X-ray left foot with no acute fracture or dislocation with notable left first digit tuft wound with diffuse soft tissue swelling. Left lower extremity vascular ultrasound negative for DVT.   ESR elevated fifty-five, CRP 26.4. Patient underwent abdominal aortogram with balloon angioplasty left lower extremity anterior tibialis by Dr. Trula Slade on 08/11/2020.  Infectious disease was consulted and followed during hospital course.  Patient will discharge on Zyvox 600 mg p.o. every 12 hours and Augmentin 875-125 mg twice daily until 08/19/2020 then will resume doxycycline and Augmentin until 08/29/2020.  Outpatient follow-up with ID, Dr. Gale Journey 3/17 at 1430.  Continue wound care left great toe with dressing changes once daily.  Acute metabolic encephalopathy, POA Etiology likely related to acute infectious process as above as well as acute on chronic renal failure. CT head without contrast with no acute intracranial findings. Mentation now appears to be back to his normal baseline.  Acute renal failure on CKD stage IIIb Baseline creatinine 1.7. Creatinine on admission 2.80. Etiology likely secondary to underlying severe sepsis and volume depletion as above.  Patient's creatinine improved during hospitalization to 1.24 at time of discharge.  Peripheral vascular  disease Left lower extremity ABI on 07/19/2020 nonconclusive. Has been previously followed by vascular surgery, Dr. Scot Dock.  Vascular surgery  was consulted and followed during hospital course.  Patient underwent abdominal aortogram with balloon angioplasty left lower extremity anterior tibialis by Dr. Trula Slade on 08/11/2020.  Continue Plavix 75 mg p.o. daily, Aspirin 81 mg p.o. daily, Crestor 10 mg p.o. daily.  Outpatient follow-up with vascular surgery in 3-4 weeks for repeat vascular duplex ultrasound with ABI.  Essential hypertension On furosemide 40 mg qAm and 72m qPM at home. TTE 07/19/2020 with LVEF 55-60%, moderate LVH, trivial MR and IVC dilated.  Type 2 diabetes mellitus Home regimen includes semaglutide 1 mg subcutaneously weekly. A1c 6.1 on 07/19/2020, well controlled.  Peripheral neuropathy: Gabapentin 100 mg BID  GERD: Continue PPI  Weakness/debility/deconditioning: Home health PT on discharge  Discharge Diagnoses:  Active Problems:   S/P unilateral BKA (below knee amputation) (HCC)   Mixed hyperlipidemia   Essential hypertension   Anemia, iron deficiency   CKD (chronic kidney disease) stage 4, GFR 15-29 ml/min (HCC)   History of stroke   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HCC)   Left ankle swelling    Discharge Instructions  Discharge Instructions    Call MD for:  difficulty breathing, headache or visual disturbances   Complete by: As directed    Call MD for:  extreme fatigue   Complete by: As directed    Call MD for:  persistant dizziness or light-headedness   Complete by: As directed    Call MD for:  persistant nausea and vomiting   Complete by: As directed    Call MD for:  redness, tenderness, or signs of infection (pain, swelling, redness, odor or green/yellow discharge around incision site)   Complete by: As directed    Call MD for:  severe uncontrolled pain   Complete by: As directed    Call MD for:  temperature >100.4   Complete by: As directed    Diet - low  sodium heart healthy   Complete by: As directed    Discharge wound care:   Complete by: As directed    Wound care to left great toe wound:  Cleanse with NS, pat dry. Cover wound with xeroform gauze, top with dry gauze and secure with conform bandaging.  Change daily. Elevate foot.   Increase activity slowly   Complete by: As directed      Allergies as of 08/13/2020      Reactions   Vancomycin Rash      Medication List    STOP taking these medications   doxycycline 100 MG capsule Commonly known as: VIBRAMYCIN Replaced by: doxycycline 100 MG tablet     TAKE these medications   amoxicillin-clavulanate 875-125 MG tablet Commonly known as: AUGMENTIN Take 1 tablet by mouth every 12 (twelve) hours for 16 days. What changed:   when to take this  additional instructions   aspirin EC 81 MG tablet Take 81 mg by mouth daily. Swallow whole.   Biotin 5000 MCG Tabs Take 5,000 mcg by mouth daily.   Castellani Paint Modified 1.5 % Liqd Apply between toes as needed for maceration.   clopidogrel 75 MG tablet Commonly known as: PLAVIX Take 1 tablet (75 mg total) by mouth daily.   colchicine 0.6 MG tablet Take 0.6 mg by mouth daily as needed (gout flare up).   doxycycline 100 MG tablet Commonly known as: VIBRA-TABS Take 1 tablet (100 mg total) by mouth every 12 (twelve) hours for 9 days. Start taking on: August 20, 2020 Replaces: doxycycline 100 MG capsule   ferrous sulfate 325 (65 FE) MG tablet Take 325  mg by mouth every Monday, Wednesday, and Friday.   fish oil-omega-3 fatty acids 1000 MG capsule Take 2 g by mouth daily.   FreeStyle Libre 14 Day Sensor Misc Use as directed to check blood sugars   FreeStyle Precision Neo Test test strip Generic drug: glucose blood Use as instructed   furosemide 40 MG tablet Commonly known as: LASIX Take 2 tabs in morning and 1 tab evening What changed:   how much to take  how to take this  when to take this  additional  instructions   gabapentin 100 MG capsule Commonly known as: NEURONTIN Take 1 capsule by mouth 3 times a day What changed:   how much to take  when to take this  additional instructions   hydrocortisone 25 MG suppository Commonly known as: ANUSOL-HC Place 25 mg rectally 2 (two) times daily as needed for hemorrhoids or anal itching.   latanoprost 0.005 % ophthalmic solution Commonly known as: XALATAN Place 1 drop into both eyes at bedtime.   linezolid 600 MG tablet Commonly known as: ZYVOX Take 1 tablet (600 mg total) by mouth every 12 (twelve) hours for 7 days.   multivitamin with minerals Tabs tablet Take 1 tablet by mouth daily.   Ozempic ([name]) 2 MG/1.5ML Sopn Generic drug: Semaglutide ([name]) Inject 1 mg into the skin once a week. What changed: when to take this   pantoprazole 40 MG tablet Commonly known as: PROTONIX Take 1 tablet  by mouth daily. What changed: when to take this   [place] into the lungs See admin instructions. CPAP- At bedtime   REFRESH OP Place 1 drop into both eyes daily as needed (dry eyes).   rosuvastatin 10 MG tablet Commonly known as: CRESTOR Take 1 tablet (10 mg total) by mouth daily. What changed:   when to take this  additional instructions            Discharge Care Instructions  (From admission, onward)         Start     Ordered   08/13/20 0000  Discharge wound care:       Comments: Wound care to left great toe wound:  Cleanse with NS, pat dry. Cover wound with xeroform gauze, top with dry gauze and secure with conform bandaging.  Change daily. Elevate foot.   08/13/20 1151          Follow-up Information    [name], [name]. Schedule an appointment as soon as possible for a visit in 1 week(s).   Specialty: General Practice Contact information: [address] [place] [address] [phone]        [name], MD .   Specialty: Cardiology Contact  information: [address] [place] [address] [phone]        [facility] [place]. Schedule an appointment as soon as possible for a visit in 3 week(s).   Specialty: Vascular Surgery Why: 3 to 4 weeks with left lower extremity duplex and ABIs Contact information: [address] [place]       Jabier Mutton, MD. Go on 08/27/2020.   Specialty: Infectious Diseases Contact information: [address] [place] [address] [phone]        Trula Slade, DPM Follow up.   Specialty: Health visitor information: [place] [address] [phone]  Allergies  Allergen Reactions  . Vancomycin Rash    Consultations:  Zyvox 2/27>>  Augmentin 3/1>>  Cefepime 2/27 - 3/1  Metronidazole 2/27 - 2/27   Procedures/Studies: DG Tibia/Fibula Left  Result Date: 08/09/2020 CLINICAL DATA:  Cellulitis. EXAM: LEFT TIBIA AND FIBULA - 2 VIEW COMPARISON:  None. FINDINGS: There is no evidence of fracture or other focal bone lesions. Diffuse soft tissue swelling of the left lower extremity. No soft tissue emphysema seen. Heavy vascular calcifications. IMPRESSION: 1. No acute fracture or dislocation identified about the left tibia/fibula. 2. Diffuse soft tissue swelling. Electronically Signed   By: Fidela Salisbury M.D.   On: 08/09/2020 17:27   DG Ankle Complete Left  Result Date: 08/10/2020 CLINICAL DATA:  Left foot and ankle pain EXAM: LEFT FOOT - COMPLETE 3+ VIEW; LEFT ANKLE COMPLETE - 3+ VIEW COMPARISON:  08/09/2020 FINDINGS: There is extensive soft tissue swelling about the patient's foot and ankle. There is no acute displaced fracture or dislocation. Advanced vascular calcifications are noted. There is a moderate-sized plantar calcaneal spur. There are degenerative changes of the first metatarsophalangeal joint. There is a persistent  erosion at the head of the proximal phalanx of the first digit. IMPRESSION: 1. No acute displaced fracture or dislocation. 2. Persistent soft tissue swelling is noted about the ankle and foot. 3. Unchanged erosion involving the head of the proximal phalanx of the first digit. 4. Persistent vascular calcifications are noted. Electronically Signed   By: Constance Holster M.D.   On: 08/10/2020 19:15   CT HEAD WO CONTRAST  Result Date: 08/09/2020 CLINICAL DATA:  Altered mental status EXAM: CT HEAD WITHOUT CONTRAST TECHNIQUE: Contiguous axial images were obtained from the base of the skull through the vertex without intravenous contrast. COMPARISON:  July 18, 2020 FINDINGS: Evaluation is limited secondary to patient motion. 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 No definitive acute infarction within the limitations of this mildly motion degraded exam. No acute hemorrhage. Periventricular white matter hypodensities consistent with sequela of chronic microvascular ischemic disease. Vascular: Vascular calcifications. Skull: No acute fracture within the limitations of the exam. Sinuses/Orbits: Near complete opacification of the LEFT maxillary sinus with osseous sequela of chronic sinusitis. Internal calcifications and high density likely reflecting chronic fungal colonization. Other: None. IMPRESSION: 1. No definitive acute intracranial abnormality within the limitations of this mildly motion degraded exam. 2. 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 3. 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 Electronically Signed   By: Valentino Saxon MD   On: 08/09/2020 17:19   CT Head Wo Contrast  Result Date: 07/18/2020 CLINICAL DATA:  Mental status change. EXAM: CT HEAD WITHOUT CONTRAST TECHNIQUE: Contiguous axial images were obtained from the base of the skull through the vertex  without intravenous contrast. COMPARISON:  None. FINDINGS: Brain: Large area of hypoattenuation in the right cerebellar cortex. More subtle area of hypoattenuation in the superior left cerebellum. No evidence of mass effect or midline shift. No evidence of acute hemorrhage. Vascular: Heavy calcific atherosclerotic disease of the intra cavernous carotid arteries. Skull: Normal. Negative for fracture or focal lesion. Sinuses/Orbits: Dense consolidation in the left maxillary sinus with high density material, query fungal sinusitis. The remainder of the paranasal sinuses and mastoid air cells are normal. Other: None. IMPRESSION: 1. Large area of hypoattenuation in the right cerebellar cortex. More subtle area of hypoattenuation in the superior left cerebellum. These findings may represent areas of  acute/subacute infarction or other destructive brain process. 2. No evidence of acute hemorrhage. 3. Dense consolidation in the left maxillary sinus with high density material, query fungal sinusitis. 4. Evaluation with brain MRI is recommended. Electronically Signed   By: Fidela Salisbury M.D.   On: 07/18/2020 14:52   MR ANGIO HEAD WO CONTRAST  Addendum Date: 07/18/2020   ADDENDUM REPORT: 07/18/2020 19:37 ADDENDUM: On further review, while limited by motion there appears to be absent flow related signal within the right intradural vertebral artery except for distally, concerning for age-indeterminant occlusion or high-grade stenosis of the proximal right vertebral artery. CTA could further characterize if clinically indicated. Findings in the report and addendum were discussed with Percell Miller, [place] via telephone at 7:32 PM via telephone. Electronically Signed   By: Margaretha Sheffield MD   On: 07/18/2020 19:37   Result Date: 07/18/2020 CLINICAL DATA:  Neuro deficit, acute stroke suspected. EXAM: MRI HEAD WITHOUT CONTRAST MRA HEAD WITHOUT CONTRAST TECHNIQUE: Multiplanar, multiecho pulse sequences of the brain and surrounding  structures were obtained without intravenous contrast. Angiographic images of the Circle of Willis were obtained using MRA technique without intravenous contrast. CONTRAST:  76m GADAVIST GADOBUTROL 1 MMOL/ML IV SOLN COMPARISON:  Same day head CT. FINDINGS: MRI HEAD FINDINGS Brain: Remote large bilateral cerebellar infarcts with encephalomalacia. Remote right paramidline pontine infarct. Small area of DWI hyperintensity without definite ADC correlate in the right frontal lobe. Mild associated edema without mass effect. Additional T2/FLAIR hyperintensities within the white matter, mild for age in likely related to chronic microvascular ischemic disease. No acute hemorrhage. No hydrocephalus. No mass lesion or abnormal mass effect. No extra-axial fluid collection. Moderate cerebral atrophy. Vascular: Poor flow void within the imaged intradural right vertebral artery Skull and upper cervical spine: Diffusely T1 hypointense marrow signal in the upper cervical spine. Sinuses/Orbits: Left maxillary sinus mucosal thickening and mild mucosal thickening of scattered ethmoid air cells. No air-fluid levels. Unremarkable orbits. Other: No mastoid effusions. MRA HEAD FINDINGS Significantly limited MRA due to patient motion. Within this limitation: Anterior circulation: Bilateral internal carotid arteries are patent through the carotid siphons. Bilateral M1 and proximal M2 MCA branches are patent without evidence of hemodynamically significant proximal stenosis. Bilateral ACAs are patent. No visible aneurysm. Posterior circulation: The intradural vertebral arteries are poorly visualized due to motion; however, there is suspected severe stenosis or occlusion of the right distal intradural vertebral artery and possible left intradural vertebral artery stenosis. Diminutive vertebrobasilar system with bilateral fetal type PCAs. There is suspected mild stenosis of the basilar artery. Bilateral PCAs are patent without evidence of  hemodynamically significant proximal stenosis. No visible aneurysm. IMPRESSION: MRI head: 1. Small area of DWI hyperintensity without definite ADC correlate in the right frontal lobe, either T2 shine through/artifact or subacute infarct. 2. Remote large bilateral cerebellar infarcts and a remote pontine infarct. 3. Diffusely T1 hypointense marrow signal in the upper cervical spine, which is nonspecific and could relate to chronic anemia, chronic hypoxia (such as in smokers), or a lymphoproliferative disorder. MRA: 1. Significantly limited MRA secondary to patient motion. 2. The intradural vertebral arteries are poorly visualized; however, there is suspected severe stenosis or occlusion of the right distal intradural vertebral artery and possible left vertebral artery stenosis. A neck CTA could further characterize if clinically indicated. 3. Diminutive vertebrobasilar system with bilateral fetal type PCAs and likely mild narrowing of the basilar artery. 4. No large vessel occlusion or evidence of hemodynamically significant proximal stenosis in the anterior circulation Electronically Signed: By: FAlbertina Parr  Adah Salvage MD On: 07/18/2020 19:25   MR Brain W and Wo Contrast  Addendum Date: 07/18/2020   ADDENDUM REPORT: 07/18/2020 19:37 ADDENDUM: On further review, while limited by motion there appears to be absent flow related signal within the right intradural vertebral artery except for distally, concerning for age-indeterminant occlusion or high-grade stenosis of the proximal right vertebral artery. CTA could further characterize if clinically indicated. Findings in the report and addendum were discussed with Percell Miller, [place] via telephone at 7:32 PM via telephone. Electronically Signed   By: Margaretha Sheffield MD   On: 07/18/2020 19:37   Result Date: 07/18/2020 CLINICAL DATA:  Neuro deficit, acute stroke suspected. EXAM: MRI HEAD WITHOUT CONTRAST MRA HEAD WITHOUT CONTRAST TECHNIQUE: Multiplanar, multiecho pulse sequences of  the brain and surrounding structures were obtained without intravenous contrast. Angiographic images of the Circle of Willis were obtained using MRA technique without intravenous contrast. CONTRAST:  40m GADAVIST GADOBUTROL 1 MMOL/ML IV SOLN COMPARISON:  Same day head CT. FINDINGS: MRI HEAD FINDINGS Brain: Remote large bilateral cerebellar infarcts with encephalomalacia. Remote right paramidline pontine infarct. Small area of DWI hyperintensity without definite ADC correlate in the right frontal lobe. Mild associated edema without mass effect. Additional T2/FLAIR hyperintensities within the white matter, mild for age in likely related to chronic microvascular ischemic disease. No acute hemorrhage. No hydrocephalus. No mass lesion or abnormal mass effect. No extra-axial fluid collection. Moderate cerebral atrophy. Vascular: Poor flow void within the imaged intradural right vertebral artery Skull and upper cervical spine: Diffusely T1 hypointense marrow signal in the upper cervical spine. Sinuses/Orbits: Left maxillary sinus mucosal thickening and mild mucosal thickening of scattered ethmoid air cells. No air-fluid levels. Unremarkable orbits. Other: No mastoid effusions. MRA HEAD FINDINGS Significantly limited MRA due to patient motion. Within this limitation: Anterior circulation: Bilateral internal carotid arteries are patent through the carotid siphons. Bilateral M1 and proximal M2 MCA branches are patent without evidence of hemodynamically significant proximal stenosis. Bilateral ACAs are patent. No visible aneurysm. Posterior circulation: The intradural vertebral arteries are poorly visualized due to motion; however, there is suspected severe stenosis or occlusion of the right distal intradural vertebral artery and possible left intradural vertebral artery stenosis. Diminutive vertebrobasilar system with bilateral fetal type PCAs. There is suspected mild stenosis of the basilar artery. Bilateral PCAs are patent  without evidence of hemodynamically significant proximal stenosis. No visible aneurysm. IMPRESSION: MRI head: 1. Small area of DWI hyperintensity without definite ADC correlate in the right frontal lobe, either T2 shine through/artifact or subacute infarct. 2. Remote large bilateral cerebellar infarcts and a remote pontine infarct. 3. Diffusely T1 hypointense marrow signal in the upper cervical spine, which is nonspecific and could relate to chronic anemia, chronic hypoxia (such as in smokers), or a lymphoproliferative disorder. MRA: 1. Significantly limited MRA secondary to patient motion. 2. The intradural vertebral arteries are poorly visualized; however, there is suspected severe stenosis or occlusion of the right distal intradural vertebral artery and possible left vertebral artery stenosis. A neck CTA could further characterize if clinically indicated. 3. Diminutive vertebrobasilar system with bilateral fetal type PCAs and likely mild narrowing of the basilar artery. 4. No large vessel occlusion or evidence of hemodynamically significant proximal stenosis in the anterior circulation Electronically Signed: By: FMargaretha SheffieldMD On: 07/18/2020 19:25   MR FOOT LEFT WO CONTRAST  Result Date: 08/10/2020 CLINICAL DATA:  Great toe osteomyelitis, long-term antibiotics. Chronic kidney disease. EXAM: MRI OF THE LEFT FOOT WITHOUT CONTRAST TECHNIQUE: Multiplanar, multisequence MR imaging of  the left forefoot was performed. No intravenous contrast was administered. COMPARISON:  Radiographs 08/09/2020 and MRI from 07/20/2020 FINDINGS: Bones/Joint/Cartilage Reduction in the edema signal in the middle and distal phalanges of the great toe likely reflecting improvement in osteomyelitis. No interphalangeal joint effusion. Stable medial erosion of the head of the proximal phalanx great toe, image 8 series 8. No other regions of significant abnormal osseous edema are identified in the forefoot. Ligaments Lisfranc ligament  intact. Muscles and Tendons Atrophic regional musculature. Soft tissues Dorsal subcutaneous edema in the forefoot, similar to previous laterally but with some increased dorsal edema medially compared to previous. IMPRESSION: 1. Reduction in edema signal in the middle and distal phalanges of the great toe likely reflecting improvement in osteomyelitis. 2. Stable medial erosion of the head of the proximal phalanx great toe. 3. Dorsal subcutaneous edema in the forefoot, similar to previous laterally but with some increased dorsal edema medially compared to previous. Electronically Signed   By: Van Clines M.D.   On: 08/10/2020 08:00   MR FOOT LEFT WO CONTRAST  Result Date: 07/20/2020 CLINICAL DATA:  Osteomyelitis, great toe draining wound. EXAM: MRI OF THE LEFT FOOT WITHOUT CONTRAST TECHNIQUE: Multiplanar, multisequence MR imaging of the left forefoot was performed. No intravenous contrast was administered. COMPARISON:  07/19/2020 radiographs FINDINGS: Bones/Joint/Cartilage Abnormal marrow edema in the distal and proximal phalanges of the great toe centered at the interphalangeal joint. The patient has a known new medial erosion of the head of the proximal phalanx of the great toe. There is no joint effusion. No other significant abnormal osseous edema along the forefoot. Ligaments The Lisfranc ligament appears intact. Muscles and Tendons Low-grade edema signal along the plantar musculature of the foot, probably neurogenic. Muscular atrophy. Soft tissues Considerable subcutaneous edema in the forefoot, especially dorsally, extending into the toes. No visible drainable abscess. IMPRESSION: 1. Abnormal marrow edema in the distal and proximal phalanges of the great toe centered at the interphalangeal joint. The patient has a known new medial erosion of the head of the proximal phalanx of the great toe. Gout or osteomyelitis could cause this imaging appearance; presence of the wound along the great toe tends to  favor infection, and if there is drainage then the likelihood of infection increases further. 2. Subcutaneous edema in the forefoot, especially dorsally, extending into the toes. No visible drainable abscess. 3. Low-grade edema signal along the plantar musculature of the foot, probably neurogenic. Electronically Signed   By: Van Clines M.D.   On: 07/20/2020 13:13   PERIPHERAL VASCULAR CATHETERIZATION  Result Date: 08/11/2020 Patient name: NIKOLAOS MADDOCKS MRN: [id number] DOB: 1935/09/17 Sex: male 08/11/2020 Pre-operative Diagnosis: Left toe ulcer Post-operative diagnosis:  Same Surgeon:  Annamarie Major Procedure Performed:  1.  Ultrasound-guided access, right femoral artery  2.  Abdominal aortogram with CO2  3.  Left lower extremity runoff  4.  Angioplasty, left anterior tibial artery  5.  Conscious sedation, 89 minutes  6.  Closure device, Mynx  Indications: This is an 85 year old gentleman who has undergone right leg amputation who now has a wound on his left foot.  He comes in today for angiographic evaluation and possible invention Procedure:  The patient was identified in the holding area and taken to room 8.  The patient was then placed supine on the table and prepped and draped in the usual sterile fashion.  A time out was called.  Conscious sedation was administered with the use of IV fentanyl and Versed under continuous physician and  nurse monitoring.  Heart rate, blood pressure, and oxygen saturation were continuously monitored.  Total sedation time was 89 minutes.  Ultrasound was used to evaluate the right common femoral artery.  It was patent .  A digital ultrasound image was acquired.  A micropuncture needle was used to access the right common femoral artery under ultrasound guidance.  An 018 wire was advanced without resistance and a micropuncture sheath was placed.  The 018 wire was removed and a benson wire was placed.  The micropuncture sheath was exchanged for a 5 french sheath.  An  omniflush catheter was advanced over the wire to the level of L-1.  An abdominal angiogram with CO2 was obtained.  Next, using the omniflush catheter and a benson wire, the aortic bifurcation was crossed and the catheter was placed into theleft external iliac artery and left runoff was obtained.  Findings:  Aortogram: No significant renal artery stenosis was identified.  The infrarenal abdominal aorta is widely patent.  Bilateral common and external iliac arteries are widely patent.  Right Lower Extremity: Not evaluated  Left Lower Extremity: Left common femoral and profundofemoral artery are widely patent.  The superficial femoral artery is widely patent without stenosis.  The popliteal artery is patent throughout its course.  The dominant runoff is the anterior tibial artery which crosses the ankle however in the distal leg there are tandem lesions >72%. the peroneal artery is patent down in the lower leg.  The posterior tibial artery is occluded. Intervention: After the above images were acquired the decision was made to proceed with intervention.  A 6 French 65 cm sheath was inserted in the left superficial femoral artery and the patient was fully heparinized.  I used a Glidewire advantage to select the anterior tibial artery.  A 3 x 60 Mustang balloon was then inserted.  It was advanced across the lesion and balloon angioplasty of lesions of the anterior tibial artery were performed at nominal pressure for 2 minutes.  Completion imaging revealed resolution of the stenosis.  At this point catheters and wires were removed.  The groin was closed with a minx.  There are no immediate complications. Impression:  #1  No significant aortoiliac occlusive disease.  #2  No significant left leg outflow stenosis  #3  Two-vessel runoff via the anterior tibial and peroneal artery.  There is tandem stenoses within the anterior tibial artery and the lower leg, greater than 70%.  These were successfully treated with balloon  angioplasty using a 3 mm balloon and no residual stenosis.  Theotis Burrow, M.D., FACS [facility] Office: [phone] Pager:  [phone]  DG Chest Port 1 View  Result Date: 08/09/2020 CLINICAL DATA:  Sepsis EXAM: PORTABLE CHEST 1 VIEW COMPARISON:  July 18, 2020 FINDINGS: The cardiomediastinal silhouette is unchanged in contour when accounting for patient rotation.Atherosclerotic calcifications of the RIGHT carotid. No pleural effusion. No pneumothorax. No acute pleuroparenchymal abnormality. Visualized abdomen is unremarkable. Multilevel degenerative changes of the thoracic spine. IMPRESSION: No acute cardiopulmonary abnormality. Electronically Signed   By: Valentino Saxon MD   On: 08/09/2020 09:39   DG Chest Port 1 View  Result Date: 07/18/2020 CLINICAL DATA:  Sepsis. EXAM: PORTABLE CHEST 1 VIEW COMPARISON:  January 16, 2020. FINDINGS: The heart size and mediastinal contours are within normal limits. Both lungs are clear. The visualized skeletal structures are unremarkable. IMPRESSION: No active disease. Electronically Signed   By: Marijo Conception M.D.   On: 07/18/2020 14:20  DG Foot 2 Views Left  Result Date: 08/09/2020 CLINICAL DATA:  Left great toe wound. EXAM: LEFT FOOT - 2 VIEW COMPARISON:  None. FINDINGS: There is no evidence of fracture or dislocation. There is no evidence of cortical destructive changes. Left first digit tuft wound. Diffuse soft tissue swelling, particularly dorsally. IMPRESSION: 1. No acute fracture or dislocation identified about the left foot. 2. Left first digit tuft wound. 3. Diffuse soft tissue swelling, particularly dorsally. Electronically Signed   By: Fidela Salisbury M.D.   On: 08/09/2020 17:26   DG Foot Complete Left  Result Date: 08/10/2020 CLINICAL DATA:  Left foot and ankle pain EXAM: LEFT FOOT - COMPLETE 3+ VIEW; LEFT ANKLE COMPLETE - 3+ VIEW COMPARISON:  08/09/2020 FINDINGS: There is extensive soft tissue swelling  about the patient's foot and ankle. There is no acute displaced fracture or dislocation. Advanced vascular calcifications are noted. There is a moderate-sized plantar calcaneal spur. There are degenerative changes of the first metatarsophalangeal joint. There is a persistent erosion at the head of the proximal phalanx of the first digit. IMPRESSION: 1. No acute displaced fracture or dislocation. 2. Persistent soft tissue swelling is noted about the ankle and foot. 3. Unchanged erosion involving the head of the proximal phalanx of the first digit. 4. Persistent vascular calcifications are noted. Electronically Signed   By: Constance Holster M.D.   On: 08/10/2020 19:15   DG Toe Great Left  Result Date: 07/19/2020 CLINICAL DATA:  Cellulitis of the foot. Left great toe wound for 8 weeks. EXAM: LEFT GREAT TOE COMPARISON:  05/15/2020 FINDINGS: No acute fracture or dislocation. Generalized osteopenia. Erosion of the distal medial corner of the first proximal phalanx new compared with the prior examination of 05/15/2020 concerning for osteomyelitis versus a crystalline arthropathy such as gout. Soft tissue are unremarkable. No radiopaque foreign body or soft tissue emphysema. Peripheral vascular atherosclerotic disease. IMPRESSION: 1. No acute osseous injury of the left great toe. 2. Erosion of the distal medial corner of the first proximal phalanx new compared with the prior examination of 05/15/2020 concerning for osteomyelitis versus a crystalline arthropathy such as gout. Electronically Signed   By: Kathreen Devoid   On: 07/19/2020 15:30   VAS [place] ABI WITH/WO TBI  Result Date: 07/20/2020 LOWER EXTREMITY DOPPLER STUDY Indications: Ulceration, and peripheral artery disease. High Risk         Hypertension, hyperlipidemia, Diabetes, past history of Factors:          smoking.  Vascular Interventions: Prev RLE BKA. Limitations: Today's exam was limited due to an open wound and bandages. Comparison Study: Prev 09/2018  Non-compressible Performing Technologist: Vonzell Schlatter RVT  Examination Guidelines: A complete evaluation includes at minimum, Doppler waveform signals and systolic blood pressure reading at the level of bilateral brachial, anterior tibial, and posterior tibial arteries, when vessel segments are accessible. Bilateral testing is considered an integral part of a complete examination. Photoelectric Plethysmograph (PPG) waveforms and toe systolic pressure readings are included as required and additional duplex testing as needed. Limited examinations for reoccurring indications may be performed as noted.  ABI Findings: +--------+------------------+-----+---------+-------+ Left    Lt Pressure (mmHg)IndexWaveform Comment +--------+------------------+-----+---------+-------+ [id number]                    triphasic        +--------+------------------+-----+---------+-------+ PTA     254               1.54 biphasic         +--------+------------------+-----+---------+-------+  DP      254               1.54 biphasic         +--------+------------------+-----+---------+-------+ +-------+-----------+-----------+------------+------------+ ABI/TBIToday's ABIToday's TBIPrevious ABIPrevious TBI +-------+-----------+-----------+------------+------------+ Right  1.5 [place]                Non-comp                 +-------+-----------+-----------+------------+------------+ Left   1.5 [place]                Non-comp                 +-------+-----------+-----------+------------+------------+ Arterial wall calcification precludes accurate ankle pressures and ABIs.  Summary: Left: Resting left ankle-brachial index indicates noncompressible left lower extremity arteries. ABIs are unreliable. Open wound left great toe.  *See table(s) above for measurements and observations.  Electronically signed by Jamelle Haring on 07/20/2020 at 5:06:55 PM.   Final    ECHOCARDIOGRAM COMPLETE  Result Date: 07/19/2020     ECHOCARDIOGRAM REPORT   Patient Name:   TAYLER LASSEN Date of Exam: 07/19/2020 Medical Rec #:  [id number]         Height:       73.0 in Accession #:    [id number]        Weight:       275.0 lb Date of Birth:  24-Jan-1936         BSA:          2.463 m Patient Age:    69 years          BP:           141/72 mmHg Patient Gender: M                 HR:           88 bpm. Exam Location:  Inpatient Procedure: 2D Echo, Cardiac Doppler and Color Doppler Indications:    Stroke 434.91 / I163.9  History:        Patient has no prior history of Echocardiogram examinations.                 CHF, Arrythmias:RBBB; Risk Factors:Hypertension, Dyslipidemia,                 Diabetes and Sleep Apnea.  Sonographer:    Jonelle Sidle Dance Referring Phys: [id number] Lebam M ECKSTAT IMPRESSIONS  1. Left ventricular ejection fraction, by estimation, is 55 to 60%. The left ventricle has normal function. The left ventricle has no regional wall motion abnormalities. There is moderate left ventricular hypertrophy. Indeterminate diastolic filling due  to E-A fusion.  2. Right ventricular systolic function is normal. The right ventricular size is normal.  3. The pericardial effusion is posterior to the left ventricle.  4. The mitral valve is abnormal. Trivial mitral valve regurgitation.  5. The aortic valve is tricuspid. Aortic valve regurgitation is not visualized.  6. Aortic dilatation noted. There is mild dilatation of the aortic root, measuring 42 mm.  7. The inferior vena cava is dilated in size with >50% respiratory variability, suggesting right atrial pressure of 8 mmHg. Conclusion(s)/Recommendation(s): No intracardiac source of embolism detected on this transthoracic study. A transesophageal echocardiogram is recommended to exclude cardiac source of embolism if clinically indicated. FINDINGS  Left Ventricle: Left ventricular ejection fraction, by estimation, is 55 to 60%. The left ventricle has normal function. The left ventricle has no regional wall  motion abnormalities. The left  ventricular internal cavity size was normal in size. There is  moderate left ventricular hypertrophy. Indeterminate diastolic filling due to E-A fusion. Right Ventricle: The right ventricular size is normal. No increase in right ventricular wall thickness. Right ventricular systolic function is normal. Left Atrium: Left atrial size was normal in size. Right Atrium: Right atrial size was normal in size. Pericardium: Trivial pericardial effusion is present. The pericardial effusion is posterior to the left ventricle. Mitral Valve: The mitral valve is abnormal. Mild mitral annular calcification. Trivial mitral valve regurgitation. Tricuspid Valve: The tricuspid valve is grossly normal. Tricuspid valve regurgitation is trivial. Aortic Valve: The aortic valve is tricuspid. Aortic valve regurgitation is not visualized. Pulmonic Valve: The pulmonic valve was normal in structure. Pulmonic valve regurgitation is not visualized. Aorta: Aortic dilatation noted. There is mild dilatation of the aortic root, measuring 42 mm. Venous: The inferior vena cava is dilated in size with greater than 50% respiratory variability, suggesting right atrial pressure of 8 mmHg. IAS/Shunts: No atrial level shunt detected by color flow Doppler.  LEFT VENTRICLE PLAX 2D LVIDd:         4.20 cm  Diastology LV PW:         1.20 cm  LV e' medial:    11.30 cm/s LV IVS:        1.40 cm  LV E/e' medial:  8.0 LVOT diam:     2.30 cm  LV e' lateral:   10.20 cm/s LV SV:         66       LV E/e' lateral: 8.8 LV SV Index:   27 LVOT Area:     4.15 cm  RIGHT VENTRICLE             IVC RV Basal diam:  2.10 cm     IVC diam: 2.00 cm RV S prime:     12.80 cm/s TAPSE (M-mode): 1.6 cm LEFT ATRIUM             Index       RIGHT ATRIUM           Index LA diam:        3.40 cm 1.38 cm/m  RA Area:     11.60 cm LA Vol (A2C):   70.5 ml 28.62 ml/m RA Volume:   22.90 ml  9.30 ml/m LA Vol (A4C):   56.3 ml 22.85 ml/m LA Biplane Vol: 64.4 ml 26.14  ml/m  AORTIC VALVE LVOT Vmax:   85.80 cm/s LVOT Vmean:  56.800 cm/s LVOT VTI:    0.160 m  AORTA Ao Root diam: 4.20 cm Ao Asc diam:  3.10 cm MITRAL VALVE MV Area (PHT): 4.15 cm    SHUNTS MV Decel Time: 183 msec    Systemic VTI:  0.16 m MV E velocity: 90.15 cm/s  Systemic Diam: 2.30 cm MV A velocity: 72.20 cm/s MV E/A ratio:  1.25 Lyman Bishop MD Electronically signed by Lyman Bishop MD Signature Date/Time: 07/19/2020/12:39:29 PM    Final    VAS US CAROTID (at [facility] and [facility] only)  Result Date: 07/19/2020 Carotid Arterial Duplex Study Indications:       CVA. Risk Factors:      Hypertension, hyperlipidemia, Diabetes, PAD. Comparison Study:  No previous exam Performing Technologist: Vonzell Schlatter RVT  Examination Guidelines: A complete evaluation includes B-mode imaging, spectral Doppler, color Doppler, and power Doppler as needed of all accessible portions of each vessel. Bilateral testing is considered an integral part of a complete examination.  Limited examinations for reoccurring indications may be performed as noted.  Right Carotid Findings: +----------+--------+--------+--------+------------------+-----------------+           PSV cm/sEDV cm/sStenosisPlaque DescriptionComments          +----------+--------+--------+--------+------------------+-----------------+ CCA Prox  89      14                                                  +----------+--------+--------+--------+------------------+-----------------+ CCA Distal77      17                                                  +----------+--------+--------+--------+------------------+-----------------+ ICA Prox  87      18      1-39%   heterogenous      Buld plaque noted +----------+--------+--------+--------+------------------+-----------------+ ICA Distal126     23                                                  +----------+--------+--------+--------+------------------+-----------------+ ECA       121                                                          +----------+--------+--------+--------+------------------+-----------------+ +----------+--------+-------+--------+-------------------+           PSV cm/sEDV cmsDescribeArm Pressure (mmHG) +----------+--------+-------+--------+-------------------+ Subclavian220                                        +----------+--------+-------+--------+-------------------+  Left Carotid Findings: +----------+--------+--------+--------+------------------+-----------------+           PSV cm/sEDV cm/sStenosisPlaque DescriptionComments          +----------+--------+--------+--------+------------------+-----------------+ CCA Prox  110     18                                                  +----------+--------+--------+--------+------------------+-----------------+ CCA Distal81      12                                                  +----------+--------+--------+--------+------------------+-----------------+ ICA Prox  85      17      1-39%   heterogenous      Bulb plaque noted +----------+--------+--------+--------+------------------+-----------------+ ICA Distal63      13                                                  +----------+--------+--------+--------+------------------+-----------------+ ECA  104     16                                                  +----------+--------+--------+--------+------------------+-----------------+ +----------+--------+--------+--------+-------------------+           PSV cm/sEDV cm/sDescribeArm Pressure (mmHG) +----------+--------+--------+--------+-------------------+ [id number]                                         +----------+--------+--------+--------+-------------------+ +---------+--------+--+--------+ VertebralPSV cm/s51EDV cm/s +---------+--------+--+--------+   Summary: Right Carotid: Velocities in the right ICA are consistent with a 1-39% stenosis. Left Carotid: Velocities in  the left ICA are consistent with a 1-39% stenosis. Vertebrals:  Bilateral vertebral arteries demonstrate antegrade flow. Subclavians: Normal flow hemodynamics were seen in bilateral subclavian              arteries. *See table(s) above for measurements and observations.  Electronically signed by Monica Martinez MD on 07/19/2020 at 2:27:52 PM.    Final    VAS [place] LOWER EXTREMITY VENOUS (DVT) (ONLY [facility] & [facility])  Result Date: 08/10/2020  Lower Venous DVT Study Indications: Edema.  Comparison Study: Prior negative Left lower extremity venous duplex done                   01/31/20 Performing Technologist: Sharion Dove RVS  Examination Guidelines: A complete evaluation includes B-mode imaging, spectral Doppler, color Doppler, and power Doppler as needed of all accessible portions of each vessel. Bilateral testing is considered an integral part of a complete examination. Limited examinations for reoccurring indications may be performed as noted. The reflux portion of the exam is performed with the patient in reverse Trendelenburg.  +-----+---------------+---------+-----------+----------+--------------+ RIGHTCompressibilityPhasicitySpontaneityPropertiesThrombus Aging +-----+---------------+---------+-----------+----------+--------------+ CFV  Full           Yes      Yes                                 +-----+---------------+---------+-----------+----------+--------------+   +---------+---------------+---------+-----------+----------+--------------+ LEFT     CompressibilityPhasicitySpontaneityPropertiesThrombus Aging +---------+---------------+---------+-----------+----------+--------------+ CFV      Full           Yes      Yes                                 +---------+---------------+---------+-----------+----------+--------------+ SFJ      Full                                                        +---------+---------------+---------+-----------+----------+--------------+ FV Prox   Full                                                        +---------+---------------+---------+-----------+----------+--------------+ FV Mid   Full                                                        +---------+---------------+---------+-----------+----------+--------------+  FV DistalFull                                                        +---------+---------------+---------+-----------+----------+--------------+ PFV      Full                                                        +---------+---------------+---------+-----------+----------+--------------+ POP      Full           Yes      Yes                                 +---------+---------------+---------+-----------+----------+--------------+ PTV      Full                                                        +---------+---------------+---------+-----------+----------+--------------+ PERO     Full                                                        +---------+---------------+---------+-----------+----------+--------------+     Summary: RIGHT: - No evidence of common femoral vein obstruction.  LEFT: - There is no evidence of deep vein thrombosis in the lower extremity.  - Ultrasound characteristics of enlarged lymph nodes noted in the groin. interstitial fluid noted throughout calf.  *See table(s) above for measurements and observations. Electronically signed by Servando Snare MD on 08/10/2020 at 5:59:30 PM.    Final       Subjective: Patient seen and examined at bedside, resting comfortably.  No family present.  No specific complaints this morning.  Ready for discharge home.  Denies headache, no fever/chills/night sweats, no nausea/vomiting/diarrhea, no chest pain, palpitations, no shortness of breath, no abdominal pain, no weakness, no fatigue, no paresthesias.  No acute events overnight per nursing staff.  Discharge Exam: Vitals:   08/13/20 0736 08/13/20 1125  BP: 124/60 (!) 152/94  Pulse:  88 90  Resp: 20 18  Temp: 98.1 F (36.7 C) 98 F (36.7 C)  SpO2: 98% 99%   Vitals:   08/12/20 2350 08/13/20 0417 08/13/20 0736 08/13/20 1125  BP:  (!) 146/71 124/60 (!) 152/94  Pulse: 87 90 88 90  Resp: _0 Temp: 98.5 F (36.9 C) 98.1 F (36.7 C) 98.1 F (36.7 C) 98 F (36.7 C)  TempSrc: Oral Oral Oral Oral  SpO2: 98% 97% 98% 99%  Weight:      Height:        General: Pt is alert, awake, not in acute distress Cardiovascular: RRR, S1/S2 +, no rubs, no gallops Respiratory: CTA bilaterally, no wheezing, no rhonchi, oxygenating well on room air Abdominal: Soft, NT, ND, bowel sounds + Extremities: no edema, no cyanosis, noted cellulitic changes left distal lower extremity with  great toe ulceration      We will get above above skin avulsion   The results of significant diagnostics from this hospitalization (including imaging, microbiology, ancillary and laboratory) are listed below for reference.     Microbiology: Recent Results (from the past 240 hour(s))  Resp Panel by RT-PCR (Flu A&B, Covid) Nasopharyngeal Swab     Status: None   Collection Time: 08/09/20  9:26 AM   Specimen: Nasopharyngeal Swab; Nasopharyngeal(NP) swabs in vial transport medium  Result Value Ref Range Status   SARS Coronavirus 2 by RT PCR NEGATIVE NEGATIVE Final    Comment: (NOTE) SARS-CoV-2 target nucleic acids are NOT DETECTED.  The SARS-CoV-2 RNA is generally detectable in upper respiratory specimens during the acute phase of infection. The lowest concentration of SARS-CoV-2 viral copies this assay can detect is 138 copies/mL. A negative result does not preclude SARS-Cov-2 infection and should not be used as the sole basis for treatment or other patient management decisions. A negative result may occur with  improper specimen collection/handling, submission of specimen other than nasopharyngeal swab, presence of viral mutation(s) within the areas targeted by this assay, and inadequate  number of viral copies(<138 copies/mL). A negative result must be combined with clinical observations, patient history, and epidemiological information. The expected result is Negative.  Fact Sheet for Patients:  EntrepreneurPulse.com.au  Fact Sheet for Healthcare Providers:  IncredibleEmployment.be  This test is no t yet approved or cleared by the [place] FDA and  has been authorized for detection and/or diagnosis of SARS-CoV-2 by FDA under an Emergency Use Authorization (EUA). This EUA will remain  in effect (meaning this test can be used) for the duration of the COVID-19 declaration under Section 564(b)(1) of the Act, 21 U.S.C.section 360bbb-3(b)(1), unless the authorization is terminated  or revoked sooner.       Influenza A by PCR NEGATIVE NEGATIVE Final   Influenza B by PCR NEGATIVE NEGATIVE Final    Comment: (NOTE) The Xpert Xpress SARS-CoV-2/FLU/RSV plus assay is intended as an aid in the diagnosis of influenza from Nasopharyngeal swab specimens and should not be used as a sole basis for treatment. Nasal washings and aspirates are unacceptable for Xpert Xpress SARS-CoV-2/FLU/RSV testing.  Fact Sheet for Patients: EntrepreneurPulse.com.au  Fact Sheet for Healthcare Providers: IncredibleEmployment.be  This test is not yet approved or cleared by the [place] FDA and has been authorized for detection and/or diagnosis of SARS-CoV-2 by FDA under an Emergency Use Authorization (EUA). This EUA will remain in effect (meaning this test can be used) for the duration of the COVID-19 declaration under Section 564(b)(1) of the Act, 21 U.S.C. section 360bbb-3(b)(1), unless the authorization is terminated or revoked.  Performed at [facility], [place] [address], [place], [place] [address]   Blood Culture (routine x 2)     Status: None (Preliminary result)   Collection Time: 08/09/20  9:26 AM    Specimen: BLOOD  Result Value Ref Range Status   Specimen Description BLOOD RIGHT ANTECUBITAL  Final   Special Requests   Final    BOTTLES DRAWN AEROBIC AND ANAEROBIC Blood Culture adequate volume   Culture   Final    NO GROWTH 3 DAYS Performed at [facility], [place] [address] [place], [place] [address]    Report Status PENDING  Incomplete  Blood Culture (routine x 2)     Status: None (Preliminary result)   Collection Time: 08/09/20  9:31 AM   Specimen: BLOOD  Result Value Ref Range  Status   Specimen Description BLOOD RIGHT ANTECUBITAL  Final   Special Requests   Final    BOTTLES DRAWN AEROBIC AND ANAEROBIC Blood Culture adequate volume   Culture   Final    NO GROWTH 3 DAYS Performed at [facility], [address] [place], [place] [address]    Report Status PENDING  Incomplete  Urine culture     Status: None   Collection Time: 08/09/20 11:59 AM   Specimen: In/Out Cath Urine  Result Value Ref Range Status   Specimen Description IN/OUT CATH URINE  Final   Special Requests NONE  Final   Culture   Final    NO GROWTH Performed at [facility], [place] [address], [place], [place] [address]    Report Status 08/10/2020 FINAL  Final  Surgical PCR screen     Status: None   Collection Time: 08/11/20  2:00 AM   Specimen: Nasal Mucosa; Nasal Swab  Result Value Ref Range Status   MRSA, PCR NEGATIVE NEGATIVE Final   Staphylococcus aureus NEGATIVE NEGATIVE Final    Comment: (NOTE) The Xpert SA Assay (FDA approved for NASAL specimens in patients 74 years of age and older), is one component of a comprehensive surveillance program. It is not intended to diagnose infection nor to guide or monitor treatment. Performed at [facility], [place] [address] [place], [place] [address]      Labs: BNP (last 3 results) No results for input(s): BNP in the last 8760 hours. Basic Metabolic Panel: Recent Labs  Lab 08/09/20 0858 08/09/20 0954 08/10/20 0059 08/11/20 0130  08/12/20 0130 08/13/20 0505  NA 140 143 142 141 141 140  K 3.9 3.9 3.8 3.9 4.2 4.3  CL 108 108 111 112* 113* 112*  CO2 17*  --  19* 22 20* 21*  GLUCOSE 175* 171* 109* 109* 111* 119*  BUN 104* 102* 86* 66* 48* 34*  CREATININE 2.85* 2.80* 2.03* 1.80* 1.42* 1.24  CALCIUM 9.6  --  9.0 9.2 9.4 9.7  MG 2.1  --   --  2.2  --   --    Liver Function Tests: Recent Labs  Lab 08/09/20 0858  AST 28  ALT 20  ALKPHOS 42  BILITOT 0.9  PROT 6.4*  ALBUMIN 3.1*   Recent Labs  Lab 08/09/20 0858  LIPASE 69*   No results for input(s): AMMONIA in the last 168 hours. CBC: Recent Labs  Lab 08/09/20 0858 08/09/20 0954 08/10/20 0059 08/11/20 0130 08/12/20 0130 08/13/20 0505  WBC 9.9  --  12.1* 13.0* 14.2* 8.2  NEUTROABS 8.2*  --   --   --   --   --   HGB 10.0* 10.5* 9.5* 9.4* 9.6* 9.6*  HCT 31.9* 31.0* 27.4* 27.0* 28.1* 28.9*  MCV 98.2  --  93.8 91.8 92.7 92.6  PLT 219  --  205 171 171 144*   Cardiac Enzymes: Recent Labs  Lab 08/09/20 0858  CKTOTAL 362   BNP: Invalid input(s): POCBNP CBG: Recent Labs  Lab 08/12/20 1102 08/12/20 1628 08/12/20 2349 08/13/20 0635 08/13/20 1123  GLUCAP 163* 157* 127* 104* 162*   D-Dimer No results for input(s): DDIMER in the last 72 hours. Hgb A1c No results for input(s): HGBA1C in the last 72 hours. Lipid Profile No results for input(s): CHOL, HDL, LDLCALC, TRIG, CHOLHDL, LDLDIRECT in the last 72 hours. Thyroid function studies No results for input(s): TSH, T4TOTAL, T3FREE, THYROIDAB in the last 72 hours.  Invalid input(s):  FREET3 Anemia work up No results for input(s): VITAMINB12, FOLATE, FERRITIN, TIBC, IRON, RETICCTPCT in the last 72 hours. Urinalysis    Component Value Date/Time   COLORURINE YELLOW 08/09/2020 1159   APPEARANCEUR HAZY (A) 08/09/2020 1159   LABSPEC 1.012 08/09/2020 1159   PHURINE 5.0 08/09/2020 1159   GLUCOSEU NEGATIVE 08/09/2020 1159   HGBUR NEGATIVE 08/09/2020 1159   BILIRUBINUR NEGATIVE 08/09/2020 1159    BILIRUBINUR negative 05/15/2019 1029   KETONESUR NEGATIVE 08/09/2020 1159   PROTEINUR 30 (A) 08/09/2020 1159   UROBILINOGEN 0.2 05/15/2019 1029   UROBILINOGEN 0.2 01/08/2011 1558   NITRITE NEGATIVE 08/09/2020 1159   LEUKOCYTESUR NEGATIVE 08/09/2020 1159   Sepsis Labs Invalid input(s): PROCALCITONIN,  WBC,  LACTICIDVEN Microbiology Recent Results (from the past 240 hour(s))  Resp Panel by RT-PCR (Flu A&B, Covid) Nasopharyngeal Swab     Status: None   Collection Time: 08/09/20  9:26 AM   Specimen: Nasopharyngeal Swab; Nasopharyngeal(NP) swabs in vial transport medium  Result Value Ref Range Status   SARS Coronavirus 2 by RT PCR NEGATIVE NEGATIVE Final    Comment: (NOTE) SARS-CoV-2 target nucleic acids are NOT DETECTED.  The SARS-CoV-2 RNA is generally detectable in upper respiratory specimens during the acute phase of infection. The lowest concentration of SARS-CoV-2 viral copies this assay can detect is 138 copies/mL. A negative result does not preclude SARS-Cov-2 infection and should not be used as the sole basis for treatment or other patient management decisions. A negative result may occur with  improper specimen collection/handling, submission of specimen other than nasopharyngeal swab, presence of viral mutation(s) within the areas targeted by this assay, and inadequate number of viral copies(<138 copies/mL). A negative result must be combined with clinical observations, patient history, and epidemiological information. The expected result is Negative.  Fact Sheet for Patients:  EntrepreneurPulse.com.au  Fact Sheet for Healthcare Providers:  IncredibleEmployment.be  This test is no t yet approved or cleared by the [place] FDA and  has been authorized for detection and/or diagnosis of SARS-CoV-2 by FDA under an Emergency Use Authorization (EUA). This EUA will remain  in effect (meaning this test can be used) for the duration of  the COVID-19 declaration under Section 564(b)(1) of the Act, 21 U.S.C.section 360bbb-3(b)(1), unless the authorization is terminated  or revoked sooner.       Influenza A by PCR NEGATIVE NEGATIVE Final   Influenza B by PCR NEGATIVE NEGATIVE Final    Comment: (NOTE) The Xpert Xpress SARS-CoV-2/FLU/RSV plus assay is intended as an aid in the diagnosis of influenza from Nasopharyngeal swab specimens and should not be used as a sole basis for treatment. Nasal washings and aspirates are unacceptable for Xpert Xpress SARS-CoV-2/FLU/RSV testing.  Fact Sheet for Patients: EntrepreneurPulse.com.au  Fact Sheet for Healthcare Providers: IncredibleEmployment.be  This test is not yet approved or cleared by the [place] FDA and has been authorized for detection and/or diagnosis of SARS-CoV-2 by FDA under an Emergency Use Authorization (EUA). This EUA will remain in effect (meaning this test can be used) for the duration of the COVID-19 declaration under Section 564(b)(1) of the Act, 21 U.S.C. section 360bbb-3(b)(1), unless the authorization is terminated or revoked.  Performed at [facility], [place] [address] [place], [place] [address]   Blood Culture (routine x 2)     Status: None (Preliminary result)   Collection Time: 08/09/20  9:26 AM   Specimen: BLOOD  Result Value Ref Range Status   Specimen Description BLOOD RIGHT ANTECUBITAL  Final  Special Requests   Final    BOTTLES DRAWN AEROBIC AND ANAEROBIC Blood Culture adequate volume   Culture   Final    NO GROWTH 3 DAYS Performed at [facility], [place] [address] [place], [place] [address]    Report Status PENDING  Incomplete  Blood Culture (routine x 2)     Status: None (Preliminary result)   Collection Time: 08/09/20  9:31 AM   Specimen: BLOOD  Result Value Ref Range Status   Specimen Description BLOOD RIGHT ANTECUBITAL  Final   Special Requests   Final    BOTTLES DRAWN  AEROBIC AND ANAEROBIC Blood Culture adequate volume   Culture   Final    NO GROWTH 3 DAYS Performed at [facility], [place] [address] [place], [place] [address]    Report Status PENDING  Incomplete  Urine culture     Status: None   Collection Time: 08/09/20 11:59 AM   Specimen: In/Out Cath Urine  Result Value Ref Range Status   Specimen Description IN/OUT CATH URINE  Final   Special Requests NONE  Final   Culture   Final    NO GROWTH Performed at [facility], [place] [address], [place], [place] [address]    Report Status 08/10/2020 FINAL  Final  Surgical PCR screen     Status: None   Collection Time: 08/11/20  2:00 AM   Specimen: Nasal Mucosa; Nasal Swab  Result Value Ref Range Status   MRSA, PCR NEGATIVE NEGATIVE Final   Staphylococcus aureus NEGATIVE NEGATIVE Final    Comment: (NOTE) The Xpert SA Assay (FDA approved for NASAL specimens in patients 31 years of age and older), is one component of a comprehensive surveillance program. It is not intended to diagnose infection nor to guide or monitor treatment. Performed at [facility], [place] [address], [place], [place] [address]      Time coordinating discharge: Over 30 minutes  SIGNED:   Rondal Vandevelde J [place] DO  [facility] 08/13/2020, 11:56 AM

## 2020-08-13 NOTE — TOC Transition Note (Addendum)
Transition of Care (TOC) - CM/SW Discharge Note Marvetta Gibbons RN, BSN Transitions of Care Unit 4E- RN Case Manager See Treatment Team for direct phone #    Patient Details  Name: Micheal Walls MRN: [id number] Date of Birth: [birth year]  Transition of Care [facility]) CM/SW Contact:  Dawayne Patricia, RN Phone Number: 08/13/2020, 1:36 PM   Clinical Narrative:    Pt stable for transition home today, orders placed for [facility] needs- CM spoke with pt at bedside regarding transition needs, list provided for [facility] choice Per CMS guidelines from medicare.gov website with star ratings (copy placed in shadow chart)- per pt he is already active with [facility] and would like to continue services with them. Pt reports he has needed DME at home (w/c and RW), family to provide transportation home.   Call made to [facility] with [name]- confirmed pt is active with them for [facility] services- RN/PT- HH orders in place for resumption of care needs. [name] will see pt within 48 hr post discharge.    Final next level of care: [place] Barriers to Discharge: No Barriers Identified   Patient Goals and CMS Choice Patient states their goals for this hospitalization and ongoing recovery are:: return home and get more strength CMS Medicare.gov Compare Post Acute Care list provided to:: Patient Choice offered to / list presented to : Patient  Discharge Placement               Home with [facility]        Discharge Plan and Services   Discharge Planning Services: CM Consult Post Acute Care Choice: Home Health,Resumption of Svcs/PTA Provider          DME Arranged: N/A DME Agency: NA       HH Arranged: PT,RN [place]: [place] Date [place]: 08/13/20 Time [place]: [id number] Representative spoke with at [place]: [place] ([place]) Interventions     Readmission Risk Interventions Readmission Risk Prevention Plan  08/13/2020  Transportation Screening Complete  PCP or Specialist Appt within 3-5 Days Complete  HRI or [place] Complete  Social Work Consult for [place] Planning/Counseling Complete  Palliative Care Screening Not Applicable  Medication Review Press photographer) Complete  Some recent data might be hidden

## 2020-08-13 NOTE — Progress Notes (Signed)
Physical Therapy Treatment Patient Details Name: DEJOUR VOS MRN: [id number] DOB: [birth year] Today's Date: 08/13/2020    History of Present Illness AXELL TRIGUEROS is a 85 y.o. male with medical history significant of HTN, CKD stage III, chronic diastolic CHF, IIDM, recent stroke, recent left great toe osteomyelitis on long-term p.o. antibiotics Augmentin and doxycycline, diabetic neuropathy, presented with increasing left leg pain and altered mentations.    PT Comments    Pt received in chair, agreeable to therapy session and with good participation and tolerance for mobility. Pt continues to demonstrate difficulty standing from lower chair height to RW (modA) and has some difficulty managing RW during stand pivot transfer. Reviewed safety with car transfer technique and sit<>stand transfers with him and pt given gait belt for safety and caregiver assist with transfers as he plans to discharge today. Pt continues to benefit from PT services to progress toward functional mobility goals. Continue to recommend HHPT.  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   Unable to simulate heights for transfers to power w/c but reviewed safety with car transfer technique as he plans to DC  Ambulation/Gait             General Gait Details: pivot chair to/from bed with prosthesis and RW   Stairs             Wheelchair Mobility    Modified Rankin (Stroke Patients Only)       Balance Overall balance assessment: Needs assistance Sitting-balance support: Feet supported;No upper extremity supported Sitting balance-Leahy Scale: Fair     Standing balance support: Bilateral upper extremity supported Standing balance-Leahy Scale: Poor Standing balance comment: reliant on BUE support                            Cognition Arousal/Alertness: Awake/alert Behavior During Therapy: WFL for tasks assessed/performed Overall Cognitive Status: Within Functional Limits for tasks assessed                                 General Comments: at times slow processing (vs drowsiness) but good command following      Exercises      General Comments General comments (skin integrity, edema, etc.): pt doffed prosthesis independently; reviewed car sequencing transfer and pt given gait belt to take home for ease of caregiver assist with transfers      Pertinent Vitals/Pain Pain Assessment: No/denies pain    Home Living  Prior Function            PT Goals (current goals can now be found in the care plan section) Acute Rehab PT Goals Patient Stated Goal: get home PT Goal Formulation: With patient Time For Goal Achievement: 08/26/20 Potential to Achieve Goals: Good Progress towards PT goals: Progressing toward goals    Frequency    Min 4X/week      PT Plan Current plan remains appropriate    Co-evaluation              AM-PAC PT "6 Clicks" Mobility   Outcome Measure  Help needed turning from your back to your side while in a flat bed without using bedrails?: None Help needed  moving from lying on your back to sitting on the side of a flat bed without using bedrails?: A Little Help needed moving to and from a bed to a chair (including a wheelchair)?: A Little Help needed standing up from a chair using your arms (e.g., wheelchair or bedside chair)?: A Lot Help needed to walk in hospital room?: A Little Help needed climbing 3-5 steps with a railing? : Total 6 Click Score: 16    End of Session Equipment Utilized During Treatment: Gait belt Activity Tolerance: Patient tolerated treatment well Patient left: with call bell/phone within reach;in bed (bed alarm not working (neither touch screen working), Print production planner) Nurse Communication: Mobility status PT Visit Diagnosis: Muscle weakness (generalized) (M62.81);Other abnormalities of gait and mobility (R26.89)     Time: 1431-1445 PT Time Calculation (min) (ACUTE ONLY): 14 min  Charges:  $Therapeutic Activity: 8-22 mins                     Arafat Cocuzza P., PTA Acute Rehabilitation Services Pager: [phone] Office: [place] 08/13/2020, 2:57 PM

## 2020-08-13 NOTE — Consult Note (Signed)
   [facility] Inpatient Consult   08/13/2020  ETHELBERT THAIN 03-09-1936 [id number]  [facility] [ACO] Patient: Medicare DCE  Patient was screened for high risk and less than 30 days readmission. . Patient will have the transition of care call conducted by the [facility] office. This patient is also in an Embedded practice which has a chronic disease management Embedded Care Management team. Spoke with patient via hospital bedside phone.  Patient consents to post hospital follow up with CCM team. HIPAA confirmed. Best contact number is [phone] confirmed.  Plan: Notification sent to the [place] Management and made aware of above needs.   Please contact for further questions,  Natividad Brood, RN BSN [facility] Liaison  [phone] business mobile phone Toll free office [phone]  Fax number: [phone] [email or web address]

## 2020-08-13 NOTE — Progress Notes (Signed)
Patient given discharge instructions medication list and follow up appointments. IV dcd. All questions answered. Will discharge home as ordered. Transported to exit via wheel chair and nursing staff. Rudell Ortman, Bettina Gavia RN

## 2020-08-14 ENCOUNTER — Telehealth: Payer: Self-pay

## 2020-08-14 ENCOUNTER — Inpatient Hospital Stay: Payer: Medicare Other | Admitting: Internal Medicine

## 2020-08-14 DIAGNOSIS — E1122 Type 2 diabetes mellitus with diabetic chronic kidney disease: Secondary | ICD-10-CM | POA: Diagnosis not present

## 2020-08-14 DIAGNOSIS — A419 Sepsis, unspecified organism: Secondary | ICD-10-CM | POA: Diagnosis not present

## 2020-08-14 DIAGNOSIS — I130 Hypertensive heart and chronic kidney disease with heart failure and stage 1 through stage 4 chronic kidney disease, or unspecified chronic kidney disease: Secondary | ICD-10-CM | POA: Diagnosis not present

## 2020-08-14 DIAGNOSIS — I5030 Unspecified diastolic (congestive) heart failure: Secondary | ICD-10-CM | POA: Diagnosis not present

## 2020-08-14 DIAGNOSIS — I69398 Other sequelae of cerebral infarction: Secondary | ICD-10-CM | POA: Diagnosis not present

## 2020-08-14 DIAGNOSIS — R531 Weakness: Secondary | ICD-10-CM | POA: Diagnosis not present

## 2020-08-14 LAB — CULTURE, BLOOD (ROUTINE X 2)
Culture: NO GROWTH
Culture: NO GROWTH
Special Requests: ADEQUATE
Special Requests: ADEQUATE

## 2020-08-14 NOTE — Telephone Encounter (Signed)
Transition Care Management Follow-up Telephone Call  Date of discharge and from where:  08/13/2020 Micheal Walls  How have you been since you were released from the hospital? Feeling better  Any questions or concerns? No  Items Reviewed:  Did the pt receive and understand the discharge instructions provided? Yes   Medications obtained and verified? Yes   Other? No   Any new allergies since your discharge? No   Dietary orders reviewed? Yes  Do you have support at home? Yes   Home Care and Equipment/Supplies: Were home health services ordered? yes If so, what is the name of the agency? ?  Has the agency set up a time to come to the patient's home? no Were any new equipment or medical supplies ordered?  No What is the name of the medical supply agency? n/a Were you able to get the supplies/equipment? not applicable Do you have any questions related to the use of the equipment or supplies? No  Functional Questionnaire: (I = Independent and D = Dependent) ADLs: I  Bathing/Dressing- I  Meal Prep- I  Eating- I  Maintaining continence- I  Transferring/Ambulation- I  Managing Meds- I  Follow up appointments reviewed:   PCP Hospital f/u appt confirmed? Yes  Scheduled to see [name] [name] on 08/17/2020 @ 9:00.  Are transportation arrangements needed? No   If their condition worsens, is the pt aware to call PCP or go to the Emergency Dept.? Yes  Was the patient provided with contact information for the PCP's office or ED? Yes  Was to pt encouraged to call back with questions or concerns? Yes

## 2020-08-17 ENCOUNTER — Other Ambulatory Visit: Payer: Self-pay

## 2020-08-17 ENCOUNTER — Encounter: Payer: Self-pay | Admitting: Nurse Practitioner

## 2020-08-17 ENCOUNTER — Telehealth: Payer: Self-pay | Admitting: *Deleted

## 2020-08-17 ENCOUNTER — Ambulatory Visit (INDEPENDENT_AMBULATORY_CARE_PROVIDER_SITE_OTHER): Payer: Medicare Other | Admitting: Nurse Practitioner

## 2020-08-17 VITALS — BP 124/70 | HR 76 | Temp 98.3°F | Wt 280.0 lb

## 2020-08-17 DIAGNOSIS — M25472 Effusion, left ankle: Secondary | ICD-10-CM | POA: Diagnosis not present

## 2020-08-17 DIAGNOSIS — Z79899 Other long term (current) drug therapy: Secondary | ICD-10-CM

## 2020-08-17 DIAGNOSIS — L03116 Cellulitis of left lower limb: Secondary | ICD-10-CM | POA: Diagnosis not present

## 2020-08-17 DIAGNOSIS — G629 Polyneuropathy, unspecified: Secondary | ICD-10-CM | POA: Diagnosis not present

## 2020-08-17 DIAGNOSIS — M869 Osteomyelitis, unspecified: Secondary | ICD-10-CM | POA: Diagnosis not present

## 2020-08-17 DIAGNOSIS — I739 Peripheral vascular disease, unspecified: Secondary | ICD-10-CM | POA: Diagnosis not present

## 2020-08-17 MED ORDER — GABAPENTIN 100 MG PO CAPS
ORAL_CAPSULE | ORAL | 0 refills | Status: DC
Start: 2020-08-17 — End: 2020-10-15

## 2020-08-17 NOTE — Patient Instructions (Signed)
Take your prescription for the blood pressure cuff to [facility] or [facility] or any supply store.   Change your gabapentin to 2 capsules in am, 1 midday and 1 evening.

## 2020-08-17 NOTE — Progress Notes (Signed)
I,Yamilka Roman [employer] as a Education administrator for [employer], [name].,have documented all relevant documentation on the behalf of [name], [name],as directed by  [name], [name] while in the presence of [name], [place].  This visit occurred during the SARS-CoV-2 public health emergency.  Safety protocols were in place, including screening questions prior to the visit, additional usage of staff PPE, and extensive cleaning of exam room while observing appropriate contact time as indicated for disinfecting solutions.  Subjective:     Patient ID: Micheal Walls , male    DOB: [birth year] , 85 y.o.   MRN: [id number]   Chief Complaint  Patient presents with  . hospital f/u    Patient stated he has been feeling some pain in his leg. He stated it feels like pins and needles. He would like some medicine to help with his pain.     HPI  Patient presents today for a hospital f/u. He is here with his daughter.  He stated he still has some pain in his left ankle it feels like pins and needles.   [facility] is going to [facility] for his left foot wound. Has had some stents placed.   He is having sharp and tingling pains to the back of his left achilles rating 9/10. Lasting 2-3 seconds at a time.  He had 2 tylenol yesterday.  Pain occurs depending on the activity, when he moves he will have pain.  When he is repositioning he will have pain.    [name] (daughter) - would like a comprehensive review. He is not drinking enough water throughout the day.   He has a HHN on Friday and will start PT once he is cleared from podiatry he is limited with the amount of walking on his foot.    Wt Readings from Last 3 Encounters: 08/17/20 : 280 lb (127 kg) 08/09/20 : 280 lb (127 kg) 07/18/20 : 275 lb (124.7 kg)  Diabetes He presents for his follow-up diabetic visit. He has type 2 diabetes mellitus. There are no hypoglycemic associated symptoms. There are no diabetic associated symptoms. There are no  hypoglycemic complications. Symptoms are stable. There are no diabetic complications. Risk factors for coronary artery disease include diabetes mellitus, sedentary lifestyle, obesity, male sex and hypertension. Current diabetic treatment includes oral agent (dual therapy).     Past Medical History:  Diagnosis Date  . Acute metabolic encephalopathy 12/19/2954  . 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 Anemia   . Aspiration pneumonia ([place]) 11/02/2019  . Diastolic heart failure ([place])   . Diverticulosis   . DM (diabetes mellitus) ([place])   . Gout   . Hyperlipemia   . OSA on CPAP   . RBBB   . Sepsis ([place]) 11/02/2019  . Systemic hypertension      Family History  Problem Relation Age of Onset  . Diabetes Mother   . Heart attack Father   . Cancer Sister      Current Outpatient Medications:  .  amoxicillin-clavulanate (AUGMENTIN) 875-125 MG tablet, Take 1 tablet by mouth every 12 (twelve) hours for 16 days., Disp: 32 tablet, Rfl: 0 .  aspirin EC 81 MG tablet, Take 81 mg by mouth daily. Swallow whole., Disp: , Rfl:  .  Biotin 5000 MCG TABS, Take 5,000 mcg by mouth daily., Disp: , Rfl:  .  clopidogrel (PLAVIX) 75 MG tablet, Take 1 tablet (75 mg total) by mouth daily., Disp: 30 tablet, Rfl: 0 .  colchicine 0.6 MG tablet, Take 0.6 mg by mouth daily as needed (gout flare up)., Disp: , Rfl:  .  Continuous Blood Gluc Sensor (FREESTYLE LIBRE 14 DAY SENSOR) MISC, Use as directed to check blood sugars, Disp: 6 each, Rfl: 1 .  doxycycline (VIBRA-TABS) 100 MG tablet, Take 1 tablet (100 mg total) by mouth every 12 (twelve) hours for 9 days., Disp: 18 tablet, Rfl: 0 .  ferrous sulfate 325 (65 FE) MG tablet, Take 325 mg by mouth every Monday, Wednesday, and Friday., Disp: , Rfl:  .  fish oil-omega-3 fatty acids 1000 MG capsule, Take 2 g by mouth daily., Disp: , Rfl:  .  furosemide (LASIX) 40 MG tablet, Take 2 tabs in morning and 1 tab evening (Patient taking differently: Take 40-80 mg by mouth 2 (two) times  daily. 80 mg in the morning and 40 mg at night), Disp: 180 tablet, Rfl: 0 .  glucose blood (FREESTYLE PRECISION NEO TEST) test strip, Use as instructed, Disp: 100 each, Rfl: 12 .  hydrocortisone (ANUSOL-HC) 25 MG suppository, Place 25 mg rectally 2 (two) times daily as needed for hemorrhoids or anal itching., Disp: , Rfl:  .  latanoprost (XALATAN) 0.005 % ophthalmic solution, Place 1 drop into both eyes at bedtime. , Disp: , Rfl:  .  Multiple Vitamin (MULTIVITAMIN WITH MINERALS) TABS tablet, Take 1 tablet by mouth daily., Disp: , Rfl:  .  pantoprazole (PROTONIX) 40 MG tablet, Take 1 tablet  by mouth daily. (Patient taking differently: Take 40 mg by mouth at bedtime.), Disp: 90 tablet, Rfl: 0 .  Polyvinyl Alcohol-Povidone (REFRESH OP), Place 1 drop into both eyes daily as needed (dry eyes)., Disp: , Rfl:  .  PRESCRIPTION MEDICATION, Inhale into the lungs See admin instructions. CPAP- At bedtime, Disp: , Rfl:  .  rosuvastatin (CRESTOR) 10 MG tablet, Take 1 tablet (10 mg total) by mouth daily. (Patient taking differently: Take 10 mg by mouth every 7 (seven) days. Wednesday of each week), Disp: 90 tablet, Rfl: 1 .  Semaglutide, [name], (OZEMPIC, [name],) 2 MG/1.5ML SOPN, Inject 1 mg into the skin once a week. (Patient taking differently: Inject 1 mg into the skin every Wednesday.), Disp: 6 pen, Rfl: 1 .  Castellani Paint Modified 1.5 % LIQD, Apply between toes as needed for maceration. (Patient not taking: Reported on 08/17/2020), Disp: 30 mL, Rfl: 3 .  gabapentin (NEURONTIN) 100 MG capsule, Take 2 capsules in am 1 capsule mid day and 1 capsule in evening, Disp: 270 capsule, Rfl: 0   Allergies  Allergen Reactions  . Vancomycin Rash     Review of Systems  Constitutional: Negative.   HENT: Negative.   Eyes: Negative.   Respiratory: Negative.   Cardiovascular: Negative.   Gastrointestinal: Negative.   Endocrine: Negative.   Genitourinary: Negative.   Neurological: Negative.   Hematological:  Negative.   Psychiatric/Behavioral: Negative.      Today's Vitals   08/17/20 0901  BP: 124/70  Pulse: 76  Temp: 98.3 F (36.8 C)  TempSrc: Oral  Weight: 280 lb (127 kg)  PainSc: 9   PainLoc: Leg   Body mass index is 36.94 kg/m.   Objective:  Physical Exam Constitutional:      General: He is not in acute distress.    Appearance: Normal appearance. He is obese.  Cardiovascular:     Rate and Rhythm: Normal rate and regular rhythm.     Pulses: Normal pulses.     Heart sounds: Normal heart sounds. No  murmur heard.   Pulmonary:     Effort: Pulmonary effort is normal. No respiratory distress.     Breath sounds: Normal breath sounds.  Musculoskeletal:        General: No swelling or tenderness. Normal range of motion.     Left lower leg: Edema (trace) present.  Skin:    General: Skin is warm and dry.     Capillary Refill: Capillary refill takes less than 2 seconds.     Comments: Dressing intact to left foot  Neurological:     General: No focal deficit present.     Mental Status: He is alert and oriented to person, place, and time.     Cranial Nerves: No cranial nerve deficit.  Psychiatric:        Mood and Affect: Mood normal.        Behavior: Behavior normal.        Thought Content: Thought content normal.        Judgment: Judgment normal.         Assessment And Plan:     1. PAD (peripheral artery disease) ([place]) TCM Performed. A member of the clinical team spoke with the patient upon dischare. Discharge summary was reviewed in full detail during the visit. Meds reconciled and compared to discharge meds. Medication list is updated and reviewed with the patient.  Greater than 50% face to face time was spent in counseling an coordination of care.  All questions were answered to the satisfaction of the patient.    He is to follow up with Vascular surgery in 3-4 weeks to repeat his lower extremity doppler with ABI  He is to start PT after his foot heels more - gabapentin  (NEURONTIN) 100 MG capsule; Take 2 capsules in am 1 capsule mid day and 1 capsule in evening  Dispense: 270 capsule; Refill: 0 - CMP14+EGFR - AMB Referral to [facility] Coordin[facility] - CBC no Diff  2. 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[place])  He is to follow up with podiatry and Infectious disease on 3/17  Continue antibiotics as ordered  - CBC no Diff  3. Cellulitis of left lower extremity  Improving continue with current medications  4. Neuropathy  Will increase his gabapentin to add a dose in the middle of the day - gabapentin (NEURONTIN) 100 MG capsule; Take 2 capsules in am 1 capsule mid day and 1 capsule in evening  Dispense: 270 capsule; Refill: 0  5. Left ankle swelling  Swelling is better   Encouraged to make sure he keeps his lower extremity elevated when at home  6. Other long term (current) drug therapy     Patient was given opportunity to ask questions. Patient verbalized understanding of the plan and was able to repeat key elements of the plan. All questions were answered to their satisfaction.  [name], [name]   I, [name], [name], have reviewed all documentation for this visit. The documentation on 08/21/20 for the exam, diagnosis, procedures, and orders are all accurate and complete.   THE PATIENT IS ENCOURAGED TO PRACTICE SOCIAL DISTANCING DUE TO THE COVID-19 PANDEMIC.

## 2020-08-17 NOTE — Chronic Care Management (AMB) (Signed)
  Chronic Care Management   Note  08/17/2020 Name: Micheal Walls MRN: [id number] DOB: [birth year]  Micheal Walls is a 86 y.o. year old male who is a primary care patient of [name], [place]. I reached out to Micheal Walls by phone today in response to a referral sent by Micheal Walls's PCP, [name], [name].     Micheal Walls was given information about Chronic Care Management services today including:  1. CCM service includes personalized support from designated clinical staff supervised by his physician, including individualized plan of care and coordination with other care providers 2. 24/7 contact phone numbers for assistance for urgent and routine care needs. 3. Service will only be billed when office clinical staff spend 20 minutes or more in a month to coordinate care. 4. Only one practitioner may furnish and bill the service in a calendar month. 5. The patient may stop CCM services at any time (effective at the end of the month) by phone call to the office staff. 6. The patient will be responsible for cost sharing (co-pay) of up to 20% of the service fee (after annual deductible is met).  Patient agreed to services and verbal consent obtained.   Follow up plan: Telephone appointment with care management team member scheduled for:  [facility] 08/19/2020 [name] 08/24/2020  [place] [facility]  Direct Dial: [phone]

## 2020-08-18 ENCOUNTER — Ambulatory Visit: Payer: Medicare Other | Admitting: Nurse Practitioner

## 2020-08-18 ENCOUNTER — Telehealth: Payer: Medicare Other

## 2020-08-18 ENCOUNTER — Encounter: Payer: Self-pay | Admitting: Podiatry

## 2020-08-18 ENCOUNTER — Ambulatory Visit (INDEPENDENT_AMBULATORY_CARE_PROVIDER_SITE_OTHER): Payer: Medicare Other | Admitting: Podiatry

## 2020-08-18 DIAGNOSIS — E1151 Type 2 diabetes mellitus with diabetic peripheral angiopathy without gangrene: Secondary | ICD-10-CM

## 2020-08-18 DIAGNOSIS — L97524 Non-pressure chronic ulcer of other part of left foot with necrosis of bone: Secondary | ICD-10-CM

## 2020-08-18 DIAGNOSIS — I739 Peripheral vascular disease, unspecified: Secondary | ICD-10-CM

## 2020-08-18 DIAGNOSIS — E1142 Type 2 diabetes mellitus with diabetic polyneuropathy: Secondary | ICD-10-CM

## 2020-08-18 LAB — CMP14+EGFR
ALT: 57 IU/L — ABNORMAL HIGH (ref 0–44)
AST: 42 IU/L — ABNORMAL HIGH (ref 0–40)
Albumin/Globulin Ratio: 1.1 — ABNORMAL LOW (ref 1.2–2.2)
Albumin: 3.6 g/dL (ref 3.6–4.6)
Alkaline Phosphatase: 80 IU/L (ref 44–121)
BUN/Creatinine Ratio: 27 — ABNORMAL HIGH (ref 10–24)
BUN: 57 mg/dL — ABNORMAL HIGH (ref 8–27)
Bilirubin Total: 0.3 mg/dL (ref 0.0–1.2)
CO2: 18 mmol/L — ABNORMAL LOW (ref 20–29)
Calcium: 9.7 mg/dL (ref 8.6–10.2)
Chloride: 103 mmol/L (ref 96–106)
Creatinine, Ser: 2.13 mg/dL — ABNORMAL HIGH (ref 0.76–1.27)
Globulin, Total: 3.2 g/dL (ref 1.5–4.5)
Glucose: 107 mg/dL — ABNORMAL HIGH (ref 65–99)
Potassium: 4.4 mmol/L (ref 3.5–5.2)
Sodium: 137 mmol/L (ref 134–144)
Total Protein: 6.8 g/dL (ref 6.0–8.5)
eGFR: 30 mL/min/{1.73_m2} — ABNORMAL LOW (ref >59)

## 2020-08-18 LAB — CBC
Hematocrit: 29.3 % — ABNORMAL LOW (ref 37.5–51.0)
Hemoglobin: 9.6 g/dL — ABNORMAL LOW (ref 13.0–17.7)
MCH: 30.4 pg (ref 26.6–33.0)
MCHC: 32.8 g/dL (ref 31.5–35.7)
MCV: 93 fL (ref 79–97)
Platelets: 135 10*3/uL — ABNORMAL LOW (ref 150–450)
RBC: 3.16 x10E6/uL — ABNORMAL LOW (ref 4.14–5.80)
RDW: 16.3 % — ABNORMAL HIGH (ref 11.6–15.4)
WBC: 8.8 10*3/uL (ref 3.4–10.8)

## 2020-08-18 NOTE — Progress Notes (Deleted)
Chronic Care Management Pharmacy Note  08/18/2020 Name:  Micheal Walls MRN:  [id number] DOB:  [birth year]  Subjective: Micheal Walls is an 85 y.o. year old male who is a primary patient of Micheal Walls, [place].  The CCM team was consulted for assistance with disease management and care coordination needs.    {CCMTELEPHONEFACETOFACE:21091510} for {CCMINITIALFOLLOWUPCHOICE:21091511} in response to provider referral for pharmacy case management and/or care coordination services.   Consent to Services:  The patient was given the following information about Chronic Care Management services today, agreed to services, and gave verbal consent: 1. CCM service includes personalized support from designated clinical staff supervised by the primary care provider, including individualized plan of care and coordination with other care providers 2. 24/7 contact phone numbers for assistance for urgent and routine care needs. 3. Service will only be billed when office clinical staff spend 20 minutes or more in a month to coordinate care. 4. Only one practitioner may furnish and bill the service in a calendar month. 5.The patient may stop CCM services at any time (effective at the end of the month) by phone call to the office staff. 6. The patient will be responsible for cost sharing (co-pay) of up to 20% of the service fee (after annual deductible is met). Patient agreed to services and consent obtained.  Patient Care Team: Micheal Brine, [name] as PCP - General (General Practice) [name], [name], MD as PCP - Cardiology (Cardiology) Micheal Walls Micheal Stapler, RN as [place] as [place], Micheal Walls, [facility] (Pharmacist)  Recent office visits: 08/17/2020 PCP OV   Recent consult visits: 08/18/2020 Podiatry ulcer of great big toe consult - He should continue his antibiotics per their plan.  Hospital visits:   Objective:  Lab Results   Component Value Date   CREATININE 2.13 (H) 08/17/2020   BUN 57 (H) 08/17/2020   GFRNONAA 58 (L) 08/13/2020   GFRAA 42 (L) 05/20/2020   NA 137 08/17/2020   K 4.4 08/17/2020   CALCIUM 9.7 08/17/2020   CO2 18 (L) 08/17/2020    Lab Results  Component Value Date/Time   HGBA1C 6.1 (H) 07/19/2020 03:30 AM   HGBA1C 6.2 (H) 05/20/2020 09:51 AM   MICROALBUR 80 05/15/2019 10:28 AM   MICROALBUR 30 03/15/2018 02:06 PM    Last diabetic Eye exam:  Lab Results  Component Value Date/Time   HMDIABEYEEXA Retinopathy (A) 07/10/2020 12:00 AM    Last diabetic Foot exam: No results found for: HMDIABFOOTEX   Lab Results  Component Value Date   CHOL 90 07/19/2020   HDL 38 (L) 07/19/2020   LDLCALC 43 07/19/2020   TRIG 44 07/19/2020   CHOLHDL 2.4 07/19/2020    Hepatic Function Latest Ref Rng & Units 08/17/2020 08/09/2020 07/21/2020  Total Protein 6.0 - 8.5 g/dL 6.8 6.4(L) 5.8(L)  Albumin 3.6 - 4.6 g/dL 3.6 3.1(L) 2.6(L)  AST 0 - 40 IU/L 42(H) 28 35  ALT 0 - 44 IU/L 57(H) 20 16  Alk Phosphatase 44 - 121 IU/L 80 42 39  Total Bilirubin 0.0 - 1.2 mg/dL 0.3 0.9 0.8  Bilirubin, Direct 0.00 - 0.40 mg/dL - - -    Lab Results  Component Value Date/Time   TSH 2.340 01/23/2020 02:54 PM   TSH 2.760 03/21/2019 10:35 AM    CBC Latest Ref Rng & Units 08/17/2020 08/13/2020 08/12/2020  WBC 3.4 - 10.8 x10E3/uL 8.8 8.2 14.2(H)  Hemoglobin 13.0 - 17.7 g/dL 9.6(L) 9.6(L) 9.6(L)  Hematocrit 37.5 - 51.0 % 29.3(L) 28.9(L) 28.1(L)  Platelets 150 - 450 x10E3/uL 135(L) 144(L) 171    No results found for: VD25OH  Clinical ASCVD: Yes  The ASCVD Risk score Micheal Walls DC Jr., et al., 2013) failed to calculate for the following reasons:   The 2013 ASCVD risk score is only valid for ages 8 to 53   The patient has a prior MI or stroke diagnosis    Depression screen [facility] 2/9 05/20/2020 09/24/2019 06/25/2019  Decreased Interest 0 0 0  Down, Depressed, Hopeless 0 0 0  PHQ - 2 Score 0 0 0  Altered sleeping - - -  Tired,  decreased energy - - -  Change in appetite - - -  Feeling bad or failure about yourself  - - -  Trouble concentrating - - -  Moving slowly or fidgety/restless - - -  Suicidal thoughts - - -  PHQ-9 Score - - -  Difficult doing work/chores - - -       Social History   Tobacco Use  Smoking Status Former Smoker  . Types: Cigars  . Quit date: 06/12/2001  . Years since quitting: 19.1  Smokeless Tobacco Never Used   BP Readings from Last 3 Encounters:  08/17/20 124/70  08/13/20 (!) 152/94  07/22/20 132/76   Pulse Readings from Last 3 Encounters:  08/17/20 76  08/13/20 90  07/22/20 89   Wt Readings from Last 3 Encounters:  08/17/20 280 lb (127 kg)  08/09/20 280 lb (127 kg)  07/18/20 275 lb (124.7 kg)    Assessment/Interventions: Review of patient past medical history, allergies, medications, health status, including review of consultants reports, laboratory and other test data, was performed as part of comprehensive evaluation and provision of chronic care management services.   SDOH:  (Social Determinants of Health) assessments and interventions performed: {yes/no:20286}   CCM Care Plan  Allergies  Allergen Reactions  . Vancomycin Rash    Medications Reviewed Today    Reviewed by [name], DPM (Physician) on 08/18/20 at [place] List Status: <None>  Medication Order Taking? Sig Documenting Provider Last Dose Status Informant  amoxicillin-clavulanate (AUGMENTIN) 875-125 MG tablet [id number] No Take 1 tablet by mouth every 12 (twelve) hours for 16 days. [place] Eric J, DO Taking Active   aspirin EC 81 MG tablet [id number] No Take 81 mg by mouth daily. Swallow whole. [provider] Taking Active Family Member  Biotin 5000 MCG TABS [id number] No Take 5,000 mcg by mouth daily. [provider] Taking Active Family Member  [facility] Modified 1.5 % LIQD [id number] No Apply between toes as needed for maceration.  Patient not taking: Reported on  08/17/2020   [name], DPM Not Taking Active Family Member  clopidogrel (PLAVIX) 75 MG tablet [id number] No Take 1 tablet (75 mg total) by mouth daily. Donne Hazel, MD Taking Active Family Member  colchicine 0.6 MG tablet [id number] No Take 0.6 mg by mouth daily as needed (gout flare up). [provider] Taking Active Family Member  Continuous Blood Gluc Sensor (FREESTYLE LIBRE 14 DAY SENSOR) [place] [id number] No Use as directed to check blood sugars Micheal Brine, [name] Taking Active Family Member  doxycycline (VIBRA-TABS) 100 MG tablet [id number] No Take 1 tablet (100 mg total) by mouth every 12 (twelve) hours for 9 days. [place] Eric J, DO Taking Active   ferrous sulfate 325 (65 FE) MG tablet [id number] No Take 325 mg by mouth every Monday, Wednesday, and Friday.  [provider] Taking Active Family Member  fish oil-omega-3 fatty acids 1000 MG capsule [id number] No Take 2 g by mouth daily. [provider] Taking Active Family Member  furosemide (LASIX) 40 MG tablet [id number] No Take 2 tabs in morning and 1 tab evening  Patient taking differently: Take 40-80 mg by mouth 2 (two) times daily. 80 mg in the morning and 40 mg at night   Micheal Brine, [name] Taking Active Family Member           Med Note Sunday Corn Aug 09, 2020 12:30 PM) 80 mg in the morning & 40 at bedtime   gabapentin (NEURONTIN) 100 MG capsule [id number]  Take 2 capsules in am 1 capsule mid day and 1 capsule in evening Micheal Brine, [name]  Active   glucose blood (FREESTYLE PRECISION NEO TEST) test strip [id number] No Use as instructed Micheal Brine, [name] Taking Active Family Member  hydrocortisone (ANUSOL-HC) 25 MG suppository [id number] No Place 25 mg rectally 2 (two) times daily as needed for hemorrhoids or anal itching. [provider] Taking Active Family Member  latanoprost (XALATAN) 0.005 % ophthalmic solution [id number] No Place 1 drop into both eyes at bedtime.  [provider] Taking Active Family Member  linezolid (ZYVOX) 600 MG tablet [id number] No Take 1 tablet (600 mg total) by mouth every 12 (twelve) hours for 7 days. [place] Donnamarie Poag, DO Taking Active   Multiple Vitamin (MULTIVITAMIN WITH MINERALS) TABS tablet [id number] No Take 1 tablet by mouth daily. [provider] Taking Active Family Member  pantoprazole (PROTONIX) 40 MG tablet [id number] No Take 1 tablet  by mouth daily.  Patient taking differently: Take 40 mg by mouth at bedtime.   Micheal Brine, [name] Taking Active   Polyvinyl Alcohol-Povidone [facility] OP) [id number] No Place 1 drop into both eyes daily as needed (dry eyes). [provider] Taking Active Family Member  PRESCRIPTION MEDICATION [id number] No Inhale into the lungs See admin instructions. CPAP- At bedtime [provider] Taking Active Family Member  rosuvastatin (CRESTOR) 10 MG tablet [id number] No Take 1 tablet (10 mg total) by mouth daily.  Patient taking differently: Take 10 mg by mouth every 7 (seven) days. Wednesday of each week   Bonnielee Haff, MD Taking Active   Semaglutide, [name], (OZEMPIC, [name],) 2 MG/1.5ML SOPN [id number] No Inject 1 mg into the skin once a week.  Patient taking differently: Inject 1 mg into the skin every Wednesday.   Micheal Brine, [name] Taking Active            Med Note Duffy Bruce, Para [place] Jan 16, 2020  8:46 PM)    Med List Note Dessie Coma, CPhT 01/16/20 2037): [place] [facility]) [phone]          Patient Active Problem List   Diagnosis Date Noted  . 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[place])   . Left ankle swelling   . Diabetic foot infection ([place])   . Cerebral thrombosis with cerebral infarction 07/20/2020  . Thrombocytopenia ([place]) 07/19/2020  . Cerebral ischemia 07/18/2020  . Bilateral hearing loss 05/20/2020  . Does use hearing aid 05/20/2020  . Diabetic ulcer of left great toe ([place]) 01/16/2020  . Controlled type 2 diabetes mellitus with stable proliferative  retinopathy of both eyes, with long-term current use of insulin ([place]) 01/06/2020  . Intermediate stage nonexudative age-related macular degeneration of both eyes 01/06/2020  . Left epiretinal membrane 01/06/2020  . [place]en of right macula  01/06/2020  . AKI (acute kidney injury) ([place]) 11/02/2019  . Cellulitis 11/02/2019  . Elevated TSH 07/10/2018  . Nephropathy 02/28/2018  . Disturbance of skin sensation 02/28/2018  . CKD (chronic kidney disease) stage 4, GFR 15-29 ml/min (HCC) 07/17/2017  . PAD (peripheral artery disease) ([place]) 07/17/2017  . 110.1 10/21/2013  . Epiphora 04/24/2013  . Laxity of eyelid 04/24/2013  . Anemia 04/09/2013  . History of peripheral vascular disease 04/09/2013  . History of stroke 04/09/2013  . S/P unilateral BKA (below knee amputation) ([place]) 03/17/2013  . Diabetes mellitus type 2 in obese ([place]) 03/17/2013  . Mixed hyperlipidemia 03/17/2013  . Essential hypertension 03/17/2013  . Gout 03/17/2013  . CKD (chronic kidney disease), stage III ([place]) 03/17/2013  . OSA on CPAP 03/17/2013  . Anemia, iron deficiency 03/17/2013  . Diverticulosis of colon 03/17/2013  . RBBB 03/17/2013  . Punctal stenosis, acquired 10/02/2012    Immunization History  Administered Date(s) Administered  . Fluad Quad(high Dose 65+) 05/20/2020  . Influenza, High Dose Seasonal PF 03/21/2019  . PFIZER(Purple Top)SARS-COV-2 Vaccination 08/31/2019, 09/25/2019, 04/03/2020  . Pneumococcal Conjugate-13 02/23/2016  . Pneumococcal Polysaccharide-23 05/15/2019  . Tdap 02/04/2013    Conditions to be addressed/monitored:  Hypertension, Hyperlipidemia and Diabetes  There are no care plans that you recently modified to display for this patient.    Medication Assistance: {MEDASSISTANCEINFO:25044}  Patient's preferred pharmacy is:  [id number] [place] [address]Phone: [phone]: [phone] [facility]  [place] [address]Phone: [phone]: [phone] [facility] [place]- [address] [place]Phone: [phone]: [phone] Uses pill box? {Yes or If no, why not?:20788} Pt endorses ***% compliance  We discussed: {Pharmacy options:24294} Patient decided to: {US Pharmacy Plan:23885}  Care Plan and Follow Up Patient Decision:  {FOLLOWUP:24991}  Plan: {CM FOLLOW UP PLAN:25073}  ***   Current Barriers:  . {pharmacybarriers:24917} . ***  Pharmacist Clinical Goal(s):  .Marland KitchenOver the next *** days, patient will {PHARMACYGOALCHOICES:24921} through collaboration with PharmD and provider.  . ***  Interventions: . 1:1 collaboration with MMinette Brine [name] regarding development and update of comprehensive plan of care as evidenced by provider attestation and co-signature . Inter-disciplinary care team collaboration (see longitudinal plan of care) . Comprehensive medication review performed; medication list updated in electronic medical record  Hypertension (BP goal <140/90) -Controlled -Current treatment: . *** -Medications previously tried: ***  -Current home readings: *** -Current dietary habits: *** -Current exercise habits: *** -{ACTIONS;DENIES/REPORTS:21021675::"Denies"} hypotensive/hypertensive symptoms -Educated on {CCM BP Counseling:25124} -Counseled to monitor BP at home ***, document, and provide log at future appointments -{CCMPHARMDINTERVENTION:25122}  Hyperlipidemia: (LDL goal < 70) -{US controlled/uncontrolled:25276} -Current treatment: . *** -Medications previously tried: ***  -Current dietary patterns: *** -Current exercise habits: *** -Educated on {CCM HLD Counseling:25126} -{CCMPHARMDINTERVENTION:25122}  Diabetes (A1c goal {A1c goals:23924}) -{US controlled/uncontrolled:25276} -Current medications: . *** -Medications  previously tried: ***  -Current home glucose readings . fasting glucose: *** . post prandial glucose: *** -{ACTIONS;DENIES/REPORTS:21021675::"Denies"} hypoglycemic/hyperglycemic symptoms -Current meal patterns:  . breakfast: ***  . lunch: ***  . dinner: *** . snacks: *** . drinks: *** -Current exercise: *** -Educated on {CCM DM COUNSELING:25123} -Counseled to check feet daily and get yearly eye exams -{CCMPHARMDINTERVENTION:25122}  CKD  (Goal: ***) -{US controlled/uncontrolled:25276} -Current treatment  . *** -Medications previously tried: ***  -{  CCMPHARMDINTERVENTION:25122}  *** (Goal: ***) -{US controlled/uncontrolled:25276} -Current treatment  . *** -Medications previously tried: ***  -{CCMPHARMDINTERVENTION:25122}  Health Maintenance -Vaccine gaps: *** -Current therapy:  . *** -Educated on {ccm supplement counseling:25128} -{CCM Patient satisfied:25129} -{CCMPHARMDINTERVENTION:25122}   Patient Goals/Self-Care Activities . Over the next *** days, patient will:  - {pharmacypatientgoals:24919}  Follow Up Plan: {CM FOLLOW UP XNTZ:00174}

## 2020-08-18 NOTE — Patient Instructions (Addendum)
Follow up with the wound care center as scheduled  As we discussed: Lets give the toe 6-12 weeks to try to heal. If it gets worse, or you end up in the hospital again with an infection we may need to proceed with amputation  If it remains stable and you feel well, we could continue with wound care

## 2020-08-18 NOTE — Progress Notes (Signed)
  Subjective:  Patient ID: Micheal Walls, male    DOB: [birth year],  MRN: [id number]  Chief Complaint  Patient presents with  . Wound Check    Left hallux Pt stated that he is doing okay he does have some soreness and stated that he sometimes gets some sharp pains that feels like pins and needles     85 y.o. male presents with the above complaint. History confirmed with patient.  He is here for follow-up with me from hospital discharge.  He said his leg is feeling better than it had been.  He is here with his daughter.  Objective:  Physical Exam: His left foot is warm to the level of the mid forefoot, cooler in the toes, he has a full-thickness ulceration measuring 0.5 x 0.3 x 0.3 cm on the dorsal medial hallux, partial thickness ulcerations on the tip of the toe.  Dystrophic nail.  His rash on the lower leg is improving, he still has lymphedematous changes in the leg as well     Assessment:   1. 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 Type II diabetes mellitus with peripheral circulatory disorder (HCC)   3. Diabetic peripheral neuropathy associated with type 2 diabetes mellitus ([place])   4. Peripheral arterial disease ([place])      Plan:  Patient was evaluated and treated and all questions answered.  Ulcer left hallux -Debridement as below. -Dressed with Prisma collagen dressing, DSD. -Continue off-loading with surgical shoe. -Discussed with the patient and his daughter that he seems to be improving.  He does have follow-up upcoming with both the wound care center as well as infectious disease.  He should continue his antibiotics per their plan.  I think would be reasonable for [place] to allow this 6 to 12 weeks to see if it is going to improve or potentially heal.  I discussed with him that if he has readmissions for cellulitis or infection or the wound deteriorates significantly we should proceed with amputation.  This would carry the risk of nonhealing  of the amputation site, this hopefully is now reduced with his recent revascularization.  Alternatively it may be preferable for him to keep his limb with a chronic wound that would essentially be in a palliative care state to prevent worsening infection or spread but allow him to ambulate on his limb as opposed to another major limb amputation which she has on the contralateral side. -Follow-up in 6 weeks for reevaluation regarding amputation  Procedure: Excisional Debridement of Wound Rationale: Removal of non-viable soft tissue from the wound to promote healing.  Anesthesia: none Pre-Debridement Wound Measurements: Unmeasurable due to overlying callus and scar tissue Post-Debridement Wound Measurements:0.5 x 0.3 x 0.3 cm  Type of Debridement: Sharp Excisional Tissue Removed: Non-viable soft tissue Depth of Debridement: subcutaneous tissue. Technique: Sharp excisional debridement to bleeding, viable wound base.  Dressing: Dry, sterile, dressing. Disposition: Patient tolerated procedure well.    Return in about 6 weeks (around 09/29/2020).

## 2020-08-19 ENCOUNTER — Encounter: Payer: Self-pay | Admitting: Nurse Practitioner

## 2020-08-19 ENCOUNTER — Telehealth: Payer: Medicare Other

## 2020-08-19 ENCOUNTER — Telehealth: Payer: Self-pay

## 2020-08-19 DIAGNOSIS — I69398 Other sequelae of cerebral infarction: Secondary | ICD-10-CM | POA: Diagnosis not present

## 2020-08-19 DIAGNOSIS — I130 Hypertensive heart and chronic kidney disease with heart failure and stage 1 through stage 4 chronic kidney disease, or unspecified chronic kidney disease: Secondary | ICD-10-CM | POA: Diagnosis not present

## 2020-08-19 DIAGNOSIS — E1122 Type 2 diabetes mellitus with diabetic chronic kidney disease: Secondary | ICD-10-CM | POA: Diagnosis not present

## 2020-08-19 DIAGNOSIS — A419 Sepsis, unspecified organism: Secondary | ICD-10-CM | POA: Diagnosis not present

## 2020-08-19 DIAGNOSIS — I5030 Unspecified diastolic (congestive) heart failure: Secondary | ICD-10-CM | POA: Diagnosis not present

## 2020-08-19 DIAGNOSIS — R531 Weakness: Secondary | ICD-10-CM | POA: Diagnosis not present

## 2020-08-19 NOTE — Telephone Encounter (Signed)
  Chronic Care Management   Outreach Note  08/19/2020 Name: Micheal Walls MRN: [id number] DOB: 08-21-1935  Referred by: [name], [name] Reason for referral : No chief complaint on file.   A second unsuccessful telephone outreach was attempted today. The patient was referred to pharmacist for assistance with care management and care coordination.  Follow Up Plan:  Will try to contact patient again later.   [name], PharmD Clinical Pharmacist [facility] [phone]

## 2020-08-20 ENCOUNTER — Telehealth: Payer: Self-pay | Admitting: Nurse Practitioner

## 2020-08-20 ENCOUNTER — Telehealth: Payer: Medicare Other

## 2020-08-20 NOTE — Telephone Encounter (Signed)
Spoke with [place] Pharmacist with [place] and needed clarification on why he is taking Ozempic, it is approved and she will send confirmation via fax.

## 2020-08-21 ENCOUNTER — Telehealth: Payer: Self-pay

## 2020-08-21 DIAGNOSIS — I69398 Other sequelae of cerebral infarction: Secondary | ICD-10-CM | POA: Diagnosis not present

## 2020-08-21 DIAGNOSIS — I5030 Unspecified diastolic (congestive) heart failure: Secondary | ICD-10-CM | POA: Diagnosis not present

## 2020-08-21 DIAGNOSIS — A419 Sepsis, unspecified organism: Secondary | ICD-10-CM | POA: Diagnosis not present

## 2020-08-21 DIAGNOSIS — I130 Hypertensive heart and chronic kidney disease with heart failure and stage 1 through stage 4 chronic kidney disease, or unspecified chronic kidney disease: Secondary | ICD-10-CM | POA: Diagnosis not present

## 2020-08-21 DIAGNOSIS — E1122 Type 2 diabetes mellitus with diabetic chronic kidney disease: Secondary | ICD-10-CM | POA: Diagnosis not present

## 2020-08-21 DIAGNOSIS — R531 Weakness: Secondary | ICD-10-CM | POA: Diagnosis not present

## 2020-08-21 NOTE — Telephone Encounter (Signed)
-----   Message from [name], [place] sent at 08/20/2020 10:16 AM EST ----- Your kidney functions have decreased again, you had improved slightly. Are you drinking water?  Have you seen a kidney specialist if not I feel it is time to see one. Your liver functions are slightly up limit your intake of processed foods.  Your Hgb is is stable, your platelets are low are you seeing any blood in your stool?

## 2020-08-21 NOTE — Telephone Encounter (Signed)
Left the patient a message to call back for lab results. 

## 2020-08-24 ENCOUNTER — Telehealth: Payer: Self-pay

## 2020-08-24 ENCOUNTER — Telehealth: Payer: Medicare Other

## 2020-08-24 ENCOUNTER — Ambulatory Visit: Payer: Self-pay

## 2020-08-24 ENCOUNTER — Ambulatory Visit (INDEPENDENT_AMBULATORY_CARE_PROVIDER_SITE_OTHER): Payer: Medicare Other

## 2020-08-24 DIAGNOSIS — E782 Mixed hyperlipidemia: Secondary | ICD-10-CM

## 2020-08-24 DIAGNOSIS — Z794 Long term (current) use of insulin: Secondary | ICD-10-CM | POA: Diagnosis not present

## 2020-08-24 DIAGNOSIS — I10 Essential (primary) hypertension: Secondary | ICD-10-CM

## 2020-08-24 DIAGNOSIS — E1159 Type 2 diabetes mellitus with other circulatory complications: Secondary | ICD-10-CM

## 2020-08-24 DIAGNOSIS — L03116 Cellulitis of left lower limb: Secondary | ICD-10-CM

## 2020-08-24 DIAGNOSIS — G629 Polyneuropathy, unspecified: Secondary | ICD-10-CM

## 2020-08-24 DIAGNOSIS — I739 Peripheral vascular disease, unspecified: Secondary | ICD-10-CM

## 2020-08-24 NOTE — Progress Notes (Signed)
Chronic Care Management   CCM RN Visit Note  08/24/2020 Name: Micheal Walls MRN: [id number] DOB: May 08, 1936  Subjective: Micheal Walls is a 85 y.o. year old male who is a primary care patient of [name], [place]. The care management team was consulted for assistance with disease management and care coordination needs.    Collaboration with embedded [name] [name]  for Case Collaboration  in response to provider referral for case management and/or care coordination services.   Consent to Services:  The patient was given the following information about Chronic Care Management services today, agreed to services, and gave verbal consent: 1. CCM service includes personalized support from designated clinical staff supervised by the primary care provider, including individualized plan of care and coordination with other care providers 2. 24/7 contact phone numbers for assistance for urgent and routine care needs. 3. Service will only be billed when office clinical staff spend 20 minutes or more in a month to coordinate care. 4. Only one practitioner may furnish and bill the service in a calendar month. 5.The patient may stop CCM services at any time (effective at the end of the month) by phone call to the office staff. 6. The patient will be responsible for cost sharing (co-pay) of up to 20% of the service fee (after annual deductible is met). Patient agreed to services and consent obtained.  Patient agreed to services and verbal consent obtained.   Assessment: Review of patient past medical history, allergies, medications, health status, including review of consultants reports, laboratory and other test data, was performed as part of comprehensive evaluation and provision of chronic care management services.   SDOH (Social Determinants of Health) assessments and interventions performed:  Yes per [name]   CCM Care Plan  Allergies  Allergen Reactions  . Vancomycin Rash    Outpatient  Encounter Medications as of 08/24/2020  Medication Sig Note  . amoxicillin-clavulanate (AUGMENTIN) 875-125 MG tablet Take 1 tablet by mouth every 12 (twelve) hours for 16 days.   Marland Kitchen aspirin EC 81 MG tablet Take 81 mg by mouth daily. Swallow whole.   . Biotin 5000 MCG TABS Take 5,000 mcg by mouth daily.   [name] Apply between toes as needed for maceration. (Patient not taking: Reported on 08/17/2020)   . colchicine 0.6 MG tablet Take 0.6 mg by mouth daily as needed (gout flare up).   . Continuous Blood Gluc Sensor (FREESTYLE LIBRE 14 DAY SENSOR) MISC Use as directed to check blood sugars   . doxycycline (VIBRA-TABS) 100 MG tablet Take 1 tablet (100 mg total) by mouth every 12 (twelve) hours for 9 days.   . ferrous sulfate 325 (65 FE) MG tablet Take 325 mg by mouth every Monday, Wednesday, and Friday.   . fish oil-omega-3 fatty acids 1000 MG capsule Take 2 g by mouth daily.   . furosemide (LASIX) 40 MG tablet Take 2 tabs in morning and 1 tab evening (Patient taking differently: Take 40-80 mg by mouth 2 (two) times daily. 80 mg in the morning and 40 mg at night) 08/09/2020: 80 mg in the morning & 40 at bedtime   . gabapentin (NEURONTIN) 100 MG capsule Take 2 capsules in am 1 capsule mid day and 1 capsule in evening   . glucose blood (FREESTYLE PRECISION NEO TEST) test strip Use as instructed   . hydrocortisone (ANUSOL-HC) 25 MG suppository Place 25 mg rectally 2 (two) times daily as needed for hemorrhoids or anal itching.   Marland Kitchen  latanoprost (XALATAN) 0.005 % ophthalmic solution Place 1 drop into both eyes at bedtime.    . Multiple Vitamin (MULTIVITAMIN WITH MINERALS) TABS tablet Take 1 tablet by mouth daily.   . pantoprazole (PROTONIX) 40 MG tablet Take 1 tablet  by mouth daily. (Patient taking differently: Take 40 mg by mouth at bedtime.)   . Polyvinyl Alcohol-Povidone (REFRESH OP) Place 1 drop into both eyes daily as needed (dry eyes).   Marland Kitchen PRESCRIPTION MEDICATION Inhale into  the lungs See admin instructions. CPAP- At bedtime   . rosuvastatin (CRESTOR) 10 MG tablet Take 1 tablet (10 mg total) by mouth daily. (Patient taking differently: Take 10 mg by mouth every 7 (seven) days. Wednesday of each week)   . Semaglutide, [name], (OZEMPIC, [name],) 2 MG/1.5ML SOPN Inject 1 mg into the skin once a week. (Patient taking differently: Inject 1 mg into the skin every Wednesday.)    No facility-administered encounter medications on file as of 08/24/2020.    Patient Active Problem List   Diagnosis Date Noted  . 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[place])   . Left ankle swelling   . Diabetic foot infection ([place])   . Cerebral thrombosis with cerebral infarction 07/20/2020  . Thrombocytopenia ([place]) 07/19/2020  . Cerebral ischemia 07/18/2020  . Bilateral hearing loss 05/20/2020  . Does use hearing aid 05/20/2020  . Diabetic ulcer of left great toe ([place]) 01/16/2020  . Controlled type 2 diabetes mellitus with stable proliferative retinopathy of both eyes, with long-term current use of insulin ([place]) 01/06/2020  . Intermediate stage nonexudative age-related macular degeneration of both eyes 01/06/2020  . Left epiretinal membrane 01/06/2020  . Drusen of right macula 01/06/2020  . AKI (acute kidney injury) ([place]) 11/02/2019  . Cellulitis 11/02/2019  . Elevated TSH 07/10/2018  . Nephropathy 02/28/2018  . Disturbance of skin sensation 02/28/2018  . CKD (chronic kidney disease) stage 4, GFR 15-29 ml/min (HCC) 07/17/2017  . PAD (peripheral artery disease) ([place]) 07/17/2017  . 110.1 10/21/2013  . Epiphora 04/24/2013  . Laxity of eyelid 04/24/2013  . Anemia 04/09/2013  . History of peripheral vascular disease 04/09/2013  . History of stroke 04/09/2013  . S/P unilateral BKA (below knee amputation) ([place]) 03/17/2013  . Diabetes mellitus type 2 in obese ([place]) 03/17/2013  . Mixed hyperlipidemia 03/17/2013  . Essential hypertension 03/17/2013  . Gout 03/17/2013  . CKD (chronic kidney  disease), stage III ([place]) 03/17/2013  . OSA on CPAP 03/17/2013  . Anemia, iron deficiency 03/17/2013  . Diverticulosis of colon 03/17/2013  . RBBB 03/17/2013  . Punctal stenosis, acquired 10/02/2012    Conditions to be addressed/monitored:PAD, Cellulitis of left lower extremity, Neuropathy, Mixed hyperlipidemia, Type 2 diabetes mellitus   Care Plan : Assist with Chronic Care Management and Care Coordination needs  Updates made by Lynne Logan, RN since 08/24/2020 12:00 AM    Problem: Assist with Chronic Care Management and Care Coordination needs   Priority: High    Goal: Assist with Chronic Care Management and Care Coordination needs   Start Date: 08/24/2020  Expected End Date: 10/25/2020  This Visit's Progress: On track  Priority: High  Note:   Current Barriers:  Marland Kitchen Knowledge Barriers related to resources and support available to address needs related to PAD, Cellulitis of left lower extremity, Neuropathy, Mixed hyperlipidemia, Type 2 diabetes mellitus  Case Manager Clinical Goal(s):  Marland Kitchen Over the next 60 days, patient will work with [name] to address needs related to disease education and support for chronic conditions including: PAD,  Cellulitis of left lower extremity, Neuropathy, Mixed hyperlipidemia, Type 2 diabetes mellitus  Interventions:  . Collaborated with  embedded [name] [name]  to establish an individualized plan of care  Patient Self Care Activities:  . Patient will work with the CCM RN CM to address care coordination needs and will continue to work with the clinical team to address health care and disease management related needs.    Next Follow Up Date: 08/26/20    Plan:Telephone follow up appointment with care management team member scheduled for:  08/26/20  Barb Merino, RN, BSN, CCM Care Management Coordinator [facility]  Direct Phone: [phone]

## 2020-08-24 NOTE — Telephone Encounter (Signed)
PA for ozempic has been approved through 08/20/21  Pt notified

## 2020-08-25 ENCOUNTER — Encounter (HOSPITAL_BASED_OUTPATIENT_CLINIC_OR_DEPARTMENT_OTHER): Payer: Medicare Other | Attending: Internal Medicine | Admitting: Internal Medicine

## 2020-08-25 ENCOUNTER — Other Ambulatory Visit: Payer: Self-pay

## 2020-08-25 DIAGNOSIS — Z7982 Long term (current) use of aspirin: Secondary | ICD-10-CM | POA: Diagnosis not present

## 2020-08-25 DIAGNOSIS — E1151 Type 2 diabetes mellitus with diabetic peripheral angiopathy without gangrene: Secondary | ICD-10-CM | POA: Insufficient documentation

## 2020-08-25 DIAGNOSIS — N184 Chronic kidney disease, stage 4 (severe): Secondary | ICD-10-CM | POA: Diagnosis not present

## 2020-08-25 DIAGNOSIS — Z89511 Acquired absence of right leg below knee: Secondary | ICD-10-CM | POA: Insufficient documentation

## 2020-08-25 DIAGNOSIS — E1169 Type 2 diabetes mellitus with other specified complication: Secondary | ICD-10-CM | POA: Insufficient documentation

## 2020-08-25 DIAGNOSIS — Z7902 Long term (current) use of antithrombotics/antiplatelets: Secondary | ICD-10-CM | POA: Insufficient documentation

## 2020-08-25 DIAGNOSIS — E1122 Type 2 diabetes mellitus with diabetic chronic kidney disease: Secondary | ICD-10-CM | POA: Diagnosis not present

## 2020-08-25 DIAGNOSIS — L97528 Non-pressure chronic ulcer of other part of left foot with other specified severity: Secondary | ICD-10-CM | POA: Diagnosis not present

## 2020-08-25 DIAGNOSIS — E11621 Type 2 diabetes mellitus with foot ulcer: Secondary | ICD-10-CM | POA: Diagnosis not present

## 2020-08-25 DIAGNOSIS — L97522 Non-pressure chronic ulcer of other part of left foot with fat layer exposed: Secondary | ICD-10-CM | POA: Diagnosis not present

## 2020-08-25 DIAGNOSIS — S91302A Unspecified open wound, left foot, initial encounter: Secondary | ICD-10-CM | POA: Diagnosis not present

## 2020-08-25 DIAGNOSIS — M86672 Other chronic osteomyelitis, left ankle and foot: Secondary | ICD-10-CM | POA: Insufficient documentation

## 2020-08-25 DIAGNOSIS — L97529 Non-pressure chronic ulcer of other part of left foot with unspecified severity: Secondary | ICD-10-CM | POA: Diagnosis present

## 2020-08-25 NOTE — Chronic Care Management (AMB) (Signed)
Chronic Care Management    Social Work Note  08/25/2020 Name: Micheal Walls MRN: [id number] DOB: 11/19/1935  Micheal Walls is a 85 y.o. year old male who is a primary care patient of [name], [place]. The CCM team was consulted to assist the patient with chronic disease management and/or care coordination needs related to: [employer] .   Engafed with patient daughter Micheal Walls by phone for initial visit in response to provider referral for social work chronic care management and care coordination services.   Consent to Services:  The patient was given information about Chronic Care Management services, agreed to services, and gave verbal consent prior to initiation of services.  Please see initial visit note for detailed documentation.   Patient agreed to services and consent obtained.   Assessment: Review of patient past medical history, allergies, medications, and health status, including review of relevant consultants reports was performed today as part of a comprehensive evaluation and provision of chronic care management and care coordination services.     SDOH (Social Determinants of Health) assessments and interventions performed:    Advanced Directives Status: Not addressed in this encounter.  CCM Care Plan  Allergies  Allergen Reactions  . Vancomycin Rash    Outpatient Encounter Medications as of 08/24/2020  Medication Sig Note  . amoxicillin-clavulanate (AUGMENTIN) 875-125 MG tablet Take 1 tablet by mouth every 12 (twelve) hours for 16 days.   Marland Kitchen aspirin EC 81 MG tablet Take 81 mg by mouth daily. Swallow whole.   . Biotin 5000 MCG TABS Take 5,000 mcg by mouth daily.   [name] Apply between toes as needed for maceration. (Patient not taking: Reported on 08/17/2020)   . colchicine 0.6 MG tablet Take 0.6 mg by mouth daily as needed (gout flare up).   . Continuous Blood Gluc Sensor (FREESTYLE LIBRE 14 DAY SENSOR) MISC Use as  directed to check blood sugars   . doxycycline (VIBRA-TABS) 100 MG tablet Take 1 tablet (100 mg total) by mouth every 12 (twelve) hours for 9 days.   . ferrous sulfate 325 (65 FE) MG tablet Take 325 mg by mouth every Monday, Wednesday, and Friday.   . fish oil-omega-3 fatty acids 1000 MG capsule Take 2 g by mouth daily.   . furosemide (LASIX) 40 MG tablet Take 2 tabs in morning and 1 tab evening (Patient taking differently: Take 40-80 mg by mouth 2 (two) times daily. 80 mg in the morning and 40 mg at night) 08/09/2020: 80 mg in the morning & 40 at bedtime   . gabapentin (NEURONTIN) 100 MG capsule Take 2 capsules in am 1 capsule mid day and 1 capsule in evening   . glucose blood (FREESTYLE PRECISION NEO TEST) test strip Use as instructed   . hydrocortisone (ANUSOL-HC) 25 MG suppository Place 25 mg rectally 2 (two) times daily as needed for hemorrhoids or anal itching.   . latanoprost (XALATAN) 0.005 % ophthalmic solution Place 1 drop into both eyes at bedtime.    . Multiple Vitamin (MULTIVITAMIN WITH MINERALS) TABS tablet Take 1 tablet by mouth daily.   . pantoprazole (PROTONIX) 40 MG tablet Take 1 tablet  by mouth daily. (Patient taking differently: Take 40 mg by mouth at bedtime.)   . Polyvinyl Alcohol-Povidone (REFRESH OP) Place 1 drop into both eyes daily as needed (dry eyes).   Marland Kitchen PRESCRIPTION MEDICATION Inhale into the lungs See admin instructions. CPAP- At bedtime   . rosuvastatin (CRESTOR) 10 MG  tablet Take 1 tablet (10 mg total) by mouth daily. (Patient taking differently: Take 10 mg by mouth every 7 (seven) days. Wednesday of each week)   . Semaglutide, [name], (OZEMPIC, [name],) 2 MG/1.5ML SOPN Inject 1 mg into the skin once a week. (Patient taking differently: Inject 1 mg into the skin every Wednesday.)    No facility-administered encounter medications on file as of 08/24/2020.    Patient Active Problem List   Diagnosis Date Noted  . 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[place])   . Left ankle  swelling   . Diabetic foot infection ([place])   . Cerebral thrombosis with cerebral infarction 07/20/2020  . Thrombocytopenia ([place]) 07/19/2020  . Cerebral ischemia 07/18/2020  . Bilateral hearing loss 05/20/2020  . Does use hearing aid 05/20/2020  . Diabetic ulcer of left great toe ([place]) 01/16/2020  . Controlled type 2 diabetes mellitus with stable proliferative retinopathy of both eyes, with long-term current use of insulin ([place]) 01/06/2020  . Intermediate stage nonexudative age-related macular degeneration of both eyes 01/06/2020  . Left epiretinal membrane 01/06/2020  . Drusen of right macula 01/06/2020  . AKI (acute kidney injury) ([place]) 11/02/2019  . Cellulitis 11/02/2019  . Elevated TSH 07/10/2018  . Nephropathy 02/28/2018  . Disturbance of skin sensation 02/28/2018  . CKD (chronic kidney disease) stage 4, GFR 15-29 ml/min (HCC) 07/17/2017  . PAD (peripheral artery disease) ([place]) 07/17/2017  . 110.1 10/21/2013  . Epiphora 04/24/2013  . Laxity of eyelid 04/24/2013  . Anemia 04/09/2013  . History of peripheral vascular disease 04/09/2013  . History of stroke 04/09/2013  . S/P unilateral BKA (below knee amputation) ([place]) 03/17/2013  . Diabetes mellitus type 2 in obese ([place]) 03/17/2013  . Mixed hyperlipidemia 03/17/2013  . Essential hypertension 03/17/2013  . Gout 03/17/2013  . CKD (chronic kidney disease), stage III ([place]) 03/17/2013  . OSA on CPAP 03/17/2013  . Anemia, iron deficiency 03/17/2013  . Diverticulosis of colon 03/17/2013  . RBBB 03/17/2013  . Punctal stenosis, acquired 10/02/2012    Conditions to be addressed/monitored: DMII and PAD; Limited access to caregiver  Care Plan : Social Work [name] Plan of Care  Updates made by [name] since 08/25/2020 12:00 AM    Problem: Mobility and Independence     Long-Range Goal: Mobility and Independence Optimized   Start Date: 08/24/2020  Expected End Date: 12/23/2020  This Visit's Progress: On track  Priority: High   Note:   Late Entry from 3.14.22  Current Barriers:  . Chronic disease management support and education needs related to DM and PAD   . Limited access to caregiver . Unable to independently prepare meals  Social Worker Clinical Goal(s):  Marland Kitchen Over the next 120 days, patient will work with SW to identify and address any acute and/or chronic care coordination needs related to the self health management of DM and PAD   . Over the next 30 days the patient and his daughter will work with SW to identify caregiver resources for in the home . Over the next 10 days the patient will be placed on the wait list for mobile meals  CCM SW Interventions: Completed with the [name] . Inter-disciplinary care team collaboration (see longitudinal plan of care) . Collaboration with [name], [name] regarding development and update of comprehensive plan of care as evidenced by provider attestation and co-signature . Successful outbound call placed to the patients daughter, Micheal Walls, to conduct a SW screen . Discussed the patient recently returned home from  an inpatient stay - Micheal Walls lives in [place] but is in town until Friday to assist with patient care needs . The patient lives in the home with his wife with limited access to a caregiver - patient has one son who lives locally and assists with meals in the evening when he gets off work - all other children live out of state . Performed chart review to note patient has upcoming appointment to wound clinic on Tuesday 3.15 and Infectious Disease appt. On Thursday 3.17 - no transportation needs identified at this time. Patient uses [facility] and Micheal Walls reports he is successful with this service . Determined the family is interested in obtaining a caregiver for the patient 3 days a week up to 4 hours per day to assist with meal preparation and medication management - family reports the patient is independent with daily self care . Provided verbal  education on [facility] in home aid program to include how the program assists patients and length of waiting list . Obtained verbal permission to place the patient on the wait list . Unsuccessful outbound call placed to Mrs. Hunt - voice message left requesting a return call . Discussed opportunity to privately hire a caregiver considering the waiting list for the in home aid program is usually 18 months long . Determined Micheal Walls has been looking on [employer] to identify a possible candidate . Provided a list via secure e-mail of in home agencies for review . Discussed the family is interested in obtaining mobile meals for the patient and his wife to alleviate some stress put on their local son to provide a meal everyday . Placed referral via [place] to [employer] [facility] [place] . Advised Micheal Walls there is a wait list for this program that could be up to 6 months long . Discussed follow up with SW to address care coordination needs while patient engages with  RN Case Manager  to address care management needs . Collaboration with RN Care Manager to review interventions and plan . Scheduled follow up call over the next week  Patient Goals/Self-Care Activities . Over the next 30 days, patient will: With the help of his children  - Patient will self administer medications as prescribed Patient will attend all scheduled provider appointments Patient will call provider office for new concerns or questions Review provided resource to identify options for a caregiver in the home Contact SW as needed prior to next scheduled call  Follow Up Plan:  SW will follow up with the patient over the next week       Follow Up Plan: SW will follow up with patient by phone over the next week.      [name], [name], [name], Certified Dementia Practitioner [place] [facility] [place] [facility] [phone]  Total time spent performing care coordination and/or care management activities with the  patient by phone or face to face = 46 minutes.

## 2020-08-25 NOTE — Patient Instructions (Signed)
Social Worker Visit Information  Goals we discussed today:  Goals Addressed            This Visit's Progress   . Mobility and Independence Optimized       Timeframe:  Long-Range Goal Priority:  High Start Date:  3.14.22                          Expected End Date: 7.13.22                       Next planned outreach: 3.18.22  Patient Goals/Self-Care Activities . Over the next 30 days, patient will: With the help of his children Patient will self administer medications as prescribed Patient will attend all scheduled provider appointments Patient will call provider office for new concerns or questions Review provided resource to identify options for a caregiver in the home Contact SW as needed prior to next scheduled call        Materials provided: Verbal education about community resources provided by phone  Mr. Diop was given information about Chronic Care Management services today including:  1. CCM service includes personalized support from designated clinical staff supervised by his physician, including individualized plan of care and coordination with other care providers 2. 24/7 contact phone numbers for assistance for urgent and routine care needs. 3. Service will only be billed when office clinical staff spend 20 minutes or more in a month to coordinate care. 4. Only one practitioner may furnish and bill the service in a calendar month. 5. The patient may stop CCM services at any time (effective at the end of the month) by phone call to the office staff. 6. The patient will be responsible for cost sharing (co-pay) of up to 20% of the service fee (after annual deductible is met).  Patient agreed to services and verbal consent obtained.   Patient verbalizes understanding of instructions provided today and agrees to view in [place].   Follow up plan: SW will follow up with patient by phone over the next week   [name], [name], [name], Certified  Dementia Practitioner [place] [facility] [place] [facility] [phone]

## 2020-08-26 ENCOUNTER — Telehealth: Payer: Self-pay

## 2020-08-26 ENCOUNTER — Telehealth: Payer: Medicare Other

## 2020-08-26 DIAGNOSIS — A419 Sepsis, unspecified organism: Secondary | ICD-10-CM | POA: Diagnosis not present

## 2020-08-26 DIAGNOSIS — R531 Weakness: Secondary | ICD-10-CM | POA: Diagnosis not present

## 2020-08-26 DIAGNOSIS — Z6841 Body Mass Index (BMI) 40.0 and over, adult: Secondary | ICD-10-CM | POA: Diagnosis not present

## 2020-08-26 DIAGNOSIS — E785 Hyperlipidemia, unspecified: Secondary | ICD-10-CM | POA: Diagnosis not present

## 2020-08-26 DIAGNOSIS — M109 Gout, unspecified: Secondary | ICD-10-CM | POA: Diagnosis not present

## 2020-08-26 DIAGNOSIS — E1122 Type 2 diabetes mellitus with diabetic chronic kidney disease: Secondary | ICD-10-CM | POA: Diagnosis not present

## 2020-08-26 DIAGNOSIS — E118 Type 2 diabetes mellitus with unspecified complications: Secondary | ICD-10-CM | POA: Diagnosis not present

## 2020-08-26 DIAGNOSIS — I5030 Unspecified diastolic (congestive) heart failure: Secondary | ICD-10-CM | POA: Diagnosis not present

## 2020-08-26 DIAGNOSIS — N3289 Other specified disorders of bladder: Secondary | ICD-10-CM | POA: Diagnosis not present

## 2020-08-26 DIAGNOSIS — I129 Hypertensive chronic kidney disease with stage 1 through stage 4 chronic kidney disease, or unspecified chronic kidney disease: Secondary | ICD-10-CM | POA: Diagnosis not present

## 2020-08-26 DIAGNOSIS — N183 Chronic kidney disease, stage 3 unspecified: Secondary | ICD-10-CM | POA: Diagnosis not present

## 2020-08-26 DIAGNOSIS — I130 Hypertensive heart and chronic kidney disease with heart failure and stage 1 through stage 4 chronic kidney disease, or unspecified chronic kidney disease: Secondary | ICD-10-CM | POA: Diagnosis not present

## 2020-08-26 DIAGNOSIS — I69398 Other sequelae of cerebral infarction: Secondary | ICD-10-CM | POA: Diagnosis not present

## 2020-08-26 NOTE — Progress Notes (Signed)
Micheal Walls, Micheal Walls ([id number]) Visit Report for 08/25/2020 HPI Details Patient Name: Date of Service: Micheal Walls, Micheal Walls 08/25/2020 9:15 A M Medical Record Number: [id number] Patient Account Number: [id number] Date of Birth/Sex: Treating RN: 02/08/1936 (85 y.o. Micheal Walls Primary Care Provider: Minette Walls Other Clinician: Referring Provider: Treating Provider/Extender: Micheal Walls in Treatment: 20 History of Present Illness HPI Description: 02/19/2020 upon evaluation today patient presents today for initial evaluation in clinic concerning issues that has been having with his bilateral lower extremities. He apparently also had a wound on his great toe. With that being said he does have a history of diabetes mellitus type 2, lymphedema, peripheral vascular disease, congestive heart failure, hypertension, right below-knee amputation, and chronic kidney disease stage IV according to records reviewed with his creatinine clearance being below 30. With that being said the legs have been actually worse than what they are currently he does not seem to have any open wounds at this point significantly he does have a small leaking area on the left leg at this point. With that being said the majority of what I see is a lot of skin changes secondary to the chronic lymphedema to be honest. There does not appear to be any signs of systemic infection or local infection based on what I see today. The patient is doing much better with regard to the wound on his toe as well according to the daughter. He has been seeing podiatry. They do have questions about whether or not he can perform physical therapy to have no limitations on physical therapy with regard to his wounds to be honest. With that being said he questions having some pain in the ankle region which again I am not really the specialist to comment on that therefore I recommended that he probably needs to discuss this  with his podiatrist. 02/26/2020 upon evaluation today patient appears to actually be doing extremely well he is completely healed and overall is looking excellent. They did get the compression stockings ordered yesterday so they should be on the way hopefully arriving if not tomorrow than Friday. In the meantime we may just utilize a Ace wrap to try to keep the edema under as much control as possible until he gets his compression stockings. READMISSION 04/03/2021 This is an 85 year old man we had for 2 visits in the clinic in September seen by Micheal Walls. Patient has chronic venous insufficiency with secondary lymphedema. He had wounds on the left anterior leg these healed out. He is also known to Dr. Doren Custard last seen in August 2020. He is known to have an occluded PTA with monophasic waveforms has an 8 occluded tibioperoneal trunk. At the time he was not felt to be a candidate for angiography because of his stage IV chronic renal failure and his wounds were closed. If he was to require angiography he would need a CO2 angiogram. His last arterial studies were in August 2020 as well. He had triphasic waveforms down to the mid popliteal biphasic distally and then monophasic at the dorsalis of the tibioperoneal trunk, ATA occluded PTA monophasic distal PTA his peroneal was not visualized. He wears compression stockings but did not bring 1 in today He thinks the wound currently we are looking at started about 2 weeks ago. He thinks it is because of abrasion on his foot wear. He has not been wearing his shoe he switched into a left surgical sandal. He has been cleaning this with saline and wrapping it  with gauze. ABI in our clinic was 0.52. Please visit previous arterial work-up by Dr. Doren Custard in August 2020 as noted. 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 We use silver collagen. He has known PAD and very significant lymphedema. I have not ordered arterial studies or vascular consult unless this area worsens 11/12; this is a patient with a punched-out wound over the interphalangeal joint of his left great toe dorsally. He has severe PAD which is not listed is being amenable to revascularization or angiography because of his stage IV chronic renal failure. We have been using collagen 12/3; punched-out area over the inner phalangeal joint of the left great toe in the setting of known PAD. He was not felt previously to be a good revascularization candidate because of renal insufficiency. We have been using collagen. The patient states he noticed this draining about 2 to 3 days ago. When he arrived in clinic our intake nurse noted purulent drainage and wound a lot larger especially in depth of 0.7 cm 12/10; patient with a wound on the dorsal part in the inner phalangeal joint of the left great toe. This deteriorated quite a bit the last time he was here. A culture I did showed Staphylococcus lugdunensis. I put him on doxycycline and I will extend this today. X-ray showed no evidence of osteomyelitis. Really not any better today there is exposed bone at the bottom of this undermining that is hard to measure 12/17; is a patient with a wound on the dorsal part of the inter phalangeal joint of his left great toe. I have him on antibiotics and I gave him a 2-week course of doxycycline this seems to have close down the wound quite a bit and there is no longer palpable bone. 12/28; dorsal part of the interphalangeal joint of the left great toe. There is no palpable bone which is improvement. He completed antibiotics. Has been using silver collagen 1/11; dorsal part of the interphalangeal joint of the left great toe. Again today there was exposed bone. This is not particularly new but had not been obvious over the last 2 weeks. We have been  using Hydrofera Blue. X-ray I did last month was negative for osteomyelitis 1/25; dorsal part of the inner phalangeal joint of the left great toe. BONE SCRAPING for PCR I did last time was completely negative, no pathogens identified. We have been using Hydrofera Blue with no improvement 2/1; dorsal part of the interphalangeal joint. He also has a new area on the plantar part of the left great toe. We have not nearly been making much improvement here. He does have known PAD 2/15; since the patient was last here he was admitted to hospital from 07/18/2020 through 07/22/2020. He was initially felt to have a possible CVA right vertebral artery stenosis and acute kidney injury. Neurology recommended aspirin and Plavix. They noted the wound on the left great toe felt that he had sepsis secondary to a diabetic left foot infection. An MRI was ordered that showed abnormal marrow edema in the distal and proximal phalanx of the great toe centered at the interphalangeal joint the patient has an erosion at the head of the proximal phalanx of the great toe there was no joint effusion no other significant abnormalities. It was felt that this might represent either gout or osteomyelitis.  The patient was seen by Dr. Gale Journey of infectious disease. He did receive ceftriaxone and Flagyl which was converted to Augmentin and doxycycline until today. He also has severe PAD. He has a follow-up with Dr. Gale Journey in early March on March 2 at 2 PM. At that point inflammatory markers assessment to see if he needs additional antibiotic therapy. 08/24/2020; the patient comes back to clinic after an almost 1 month hiatus. During this time he was admitted to hospital from 2/27 through 3/3. During this admission he was felt to more certainly have infection as the cause of this with an elevated lactic acid level. X-ray of the foot was negative. Sedimentation rate of 55 CRP at 26.4. He underwent an angiogram by Dr. Trula Slade on 3/1 with balloon  angioplasty of the left lower extremity anterior tibials. Infectious disease also saw the patient. He was discharged on Zyvox and Augmentin until 3/9 and then doxycycline and Augmentin until 3/19 which she is currently taking. He is on Plavix and aspirin. He is to follow-up with vascular surgery in 3 weeks or so for repeat vascular duplex ultrasound with ABI. They are using Prisma to the open areas on the left great toe. The patient denies any systemic symptoms or pain Electronic Signature(s) Signed: 08/25/2020 5:48:16 PM By: Linton Ham MD Entered By: Linton Ham on 08/25/2020 10:08:59 -------------------------------------------------------------------------------- Physical Exam Details Patient Name: Date of Service: Micheal Quails D. 08/25/2020 9:15 A M Medical Record Number: [id number] Patient Account Number: [id number] Date of Birth/Sex: Treating RN: [birth year] (85 y.o. Micheal Walls Primary Care Provider: Minette Walls Other Clinician: Referring Provider: Treating Provider/Extender: Micheal Walls in Treatment: 20 Constitutional Sitting or standing Blood Pressure is within target range for patient.. Pulse regular and within target range for patient.Marland Kitchen Respirations regular, non-labored and within target range.. Temperature is normal and within the target range for the patient.Marland Kitchen Appears in no distress. Respiratory work of breathing is normal. Cardiovascular Pedal pulses absent bilaterally.. Musculoskeletal There is no palpable pain over the interphalangeal joint. Notes Wound exam; open wound on the dorsal interphalangeal joint of the left great toe. Once again this probes easily to bone. There is no obvious swelling of the interphalangeal joint itself. He also has a probing wound on the tip of the left great toe which is Water engineer) Signed: 08/25/2020 5:48:16 PM By: Linton Ham MD Entered By: Linton Ham on 08/25/2020  10:11:23 -------------------------------------------------------------------------------- Physician Orders Details Patient Name: Date of Service: Micheal Quails D. 08/25/2020 9:15 A M Medical Record Number: [id number] Patient Account Number: [id number] Date of Birth/Sex: Treating RN: 1935/08/30 (85 y.o. Micheal Walls Primary Care Provider: Minette Walls Other Clinician: Referring Provider: Treating Provider/Extender: Micheal Walls in Treatment: 20 Verbal / Phone Orders: No Diagnosis Coding Follow-up Appointments Return Appointment in 2 weeks. Bathing/ Shower/ Hygiene May shower and wash wound with soap and water. Edema Control - Lymphedema / SCD / Other Bilateral Lower Extremities Elevate legs to the level of the heart or above for 30 minutes daily and/or when sitting, a frequency of: - throughout the day Avoid standing for long periods of time. Patient to wear own compression stockings every day. - both legs Exercise regularly Moisturize legs daily. Off-Loading Wound #1 Left T Great oe Other: - use felt for offloading Wound #2 Left,Plantar T Great oe Other: - use felt for offloading [facility] wound care orders this week; continue Home Health for wound care. May utilize formulary equivalent dressing for wound  treatment orders unless otherwise specified. - [place]; Home health to change Wednesday's and Friday's on weeks that pt. comes to [facility] and Monday's, wednesday's, and friday's on weeks that pt. does not come to [facility]. Wound Treatment Wound #1 - T Great oe Wound Laterality: Left Cleanser: Wound Cleanser Every Other Day/15 Days Discharge Instructions: Cleanse the wound with wound cleanser prior to applying a clean dressing using gauze sponges, not tissue or cotton balls. Cleanser: Soap and Water Every Other Day/15 Days Discharge Instructions: May shower and wash wound with dial antibacterial soap and water prior to dressing  change. Cleanser: Normal Saline (Generic) Every Other Day/15 Days Discharge Instructions: Cleanse the wound with Normal Saline prior to applying a clean dressing using gauze sponges, not tissue or cotton balls. Prim Dressing: Promogran Prisma Matrix, 4.34 (sq in) (silver collagen) Every Other Day/15 Days ary Discharge Instructions: Moisten collagen with saline or hydrogel Secondary Dressing: Woven Gauze Sponge, Non-Sterile 4x4 in (Generic) Every Other Day/15 Days Discharge Instructions: Apply over primary dressing as directed. Secondary Dressing: Woven Gauze Sponges 2x2 in (Generic) Every Other Day/15 Days Discharge Instructions: Apply over primary dressing as directed. Secondary Dressing: Optifoam Non-Adhesive Dressing, 4x4 in Every Other Day/15 Days Discharge Instructions: Use foam donut for offloading Secured With: Conforming Stretch Gauze Bandage, Sterile 2x75 (in/in) (Generic) Every Other Day/15 Days Discharge Instructions: Secure with stretch gauze as directed. Secured With: Paper Tape, 1x10 (in/yd) Every Other Day/15 Days Discharge Instructions: Secure dressing with tape as directed. Wound #2 - T Great oe Wound Laterality: Plantar, Left Cleanser: Wound Cleanser Every Other Day/15 Days Discharge Instructions: Cleanse the wound with wound cleanser prior to applying a clean dressing using gauze sponges, not tissue or cotton balls. Cleanser: Soap and Water Every Other Day/15 Days Discharge Instructions: May shower and wash wound with dial antibacterial soap and water prior to dressing change. Cleanser: Normal Saline Every Other Day/15 Days Discharge Instructions: Cleanse the wound with Normal Saline prior to applying a clean dressing using gauze sponges, not tissue or cotton balls. Prim Dressing: Promogran Prisma Matrix, 4.34 (sq in) (silver collagen) Every Other Day/15 Days ary Discharge Instructions: Moisten collagen with saline or hydrogel Secondary Dressing: Woven Gauze Sponge,  Non-Sterile 4x4 in (Generic) Every Other Day/15 Days Discharge Instructions: Apply over primary dressing as directed. Secondary Dressing: Woven Gauze Sponges 2x2 in (Generic) Every Other Day/15 Days Discharge Instructions: Apply over primary dressing as directed. Secondary Dressing: Optifoam Non-Adhesive Dressing, 4x4 in Every Other Day/15 Days Discharge Instructions: Use foam donut for offloading Secured With: Conforming Stretch Gauze Bandage, Sterile 2x75 (in/in) (Generic) Every Other Day/15 Days Discharge Instructions: Secure with stretch gauze as directed. Secured With: Paper Tape, 1x10 (in/yd) (Generic) Every Other Day/15 Days Discharge Instructions: Secure dressing with tape as directed. Electronic Signature(s) Signed: 08/25/2020 5:48:16 PM By: Linton Ham MD Signed: 08/26/2020 5:17:38 PM By: Rhae Hammock RN Signed: 08/26/2020 5:17:38 PM By: Rhae Hammock RN Entered By: Rhae Hammock on 08/25/2020 09:58:52 -------------------------------------------------------------------------------- Progress Note Details Patient Name: Date of Service: Micheal Quails D. 08/25/2020 9:15 A M Medical Record Number: [id number] Patient Account Number: [id number] Date of Birth/Sex: Treating RN: 07-19-1935 (85 y.o. Micheal Walls, Micheal Walls Primary Care Provider: Minette Walls Other Clinician: Referring Provider: Treating Provider/Extender: Micheal Walls in Treatment: 20 Subjective History of Present Illness (HPI) 02/19/2020 upon evaluation today patient presents today for initial evaluation in clinic concerning issues that has been having with his bilateral lower extremities. He apparently also had a wound on his great toe. With that being  said he does have a history of diabetes mellitus type 2, lymphedema, peripheral vascular disease, congestive heart failure, hypertension, right below-knee amputation, and chronic kidney disease stage IV according to records reviewed  with his creatinine clearance being below 30. With that being said the legs have been actually worse than what they are currently he does not seem to have any open wounds at this point significantly he does have a small leaking area on the left leg at this point. With that being said the majority of what I see is a lot of skin changes secondary to the chronic lymphedema to be honest. There does not appear to be any signs of systemic infection or local infection based on what I see today. The patient is doing much better with regard to the wound on his toe as well according to the daughter. He has been seeing podiatry. They do have questions about whether or not he can perform physical therapy to have no limitations on physical therapy with regard to his wounds to be honest. With that being said he questions having some pain in the ankle region which again I am not really the specialist to comment on that therefore I recommended that he probably needs to discuss this with his podiatrist. 02/26/2020 upon evaluation today patient appears to actually be doing extremely well he is completely healed and overall is looking excellent. They did get the compression stockings ordered yesterday so they should be on the way hopefully arriving if not tomorrow than Friday. In the meantime we may just utilize a Ace wrap to try to keep the edema under as much control as possible until he gets his compression stockings. READMISSION 04/03/2021 This is an 85 year old man we had for 2 visits in the clinic in September seen by Micheal Walls. Patient has chronic venous insufficiency with secondary lymphedema. He had wounds on the left anterior leg these healed out. He is also known to Dr. Doren Custard last seen in August 2020. He is known to have an occluded PTA with monophasic waveforms has an 8 occluded tibioperoneal trunk. At the time he was not felt to be a candidate for angiography because of his stage IV chronic renal failure  and his wounds were closed. If he was to require angiography he would need a CO2 angiogram. His last arterial studies were in August 2020 as well. He had triphasic waveforms down to the mid popliteal biphasic distally and then monophasic at the dorsalis of the tibioperoneal trunk, ATA occluded PTA monophasic distal PTA his peroneal was not visualized. He wears compression stockings but did not bring 1 in today He thinks the wound currently we are looking at started about 2 weeks ago. He thinks it is because of abrasion on his foot wear. He has not been wearing his shoe he switched into a left surgical sandal. He has been cleaning this with saline and wrapping it with gauze. ABI in our clinic was 0.52. Please visit previous arterial work-up by Dr. Doren Custard in August 2020 as noted. 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 We use silver collagen. He has known PAD and very significant lymphedema. I have not ordered arterial studies or vascular consult unless this area worsens 11/12; this is a patient with a punched-out wound over the interphalangeal joint of his left great toe dorsally.  He has severe PAD which is not listed is being amenable to revascularization or angiography because of his stage IV chronic renal failure. We have been using collagen 12/3; punched-out area over the inner phalangeal joint of the left great toe in the setting of known PAD. He was not felt previously to be a good revascularization candidate because of renal insufficiency. We have been using collagen. The patient states he noticed this draining about 2 to 3 days ago. When he arrived in clinic our intake nurse noted purulent drainage and wound a lot larger especially in depth of 0.7 cm 12/10; patient with a wound on the dorsal part in the inner phalangeal  joint of the left great toe. This deteriorated quite a bit the last time he was here. A culture I did showed Staphylococcus lugdunensis. I put him on doxycycline and I will extend this today. X-ray showed no evidence of osteomyelitis. Really not any better today there is exposed bone at the bottom of this undermining that is hard to measure 12/17; is a patient with a wound on the dorsal part of the inter phalangeal joint of his left great toe. I have him on antibiotics and I gave him a 2-week course of doxycycline this seems to have close down the wound quite a bit and there is no longer palpable bone. 12/28; dorsal part of the interphalangeal joint of the left great toe. There is no palpable bone which is improvement. He completed antibiotics. Has been using silver collagen 1/11; dorsal part of the interphalangeal joint of the left great toe. Again today there was exposed bone. This is not particularly new but had not been obvious over the last 2 weeks. We have been using Hydrofera Blue. X-ray I did last month was negative for osteomyelitis 1/25; dorsal part of the inner phalangeal joint of the left great toe. BONE SCRAPING for PCR I did last time was completely negative, no pathogens identified. We have been using Hydrofera Blue with no improvement 2/1; dorsal part of the interphalangeal joint. He also has a new area on the plantar part of the left great toe. We have not nearly been making much improvement here. He does have known PAD 2/15; since the patient was last here he was admitted to hospital from 07/18/2020 through 07/22/2020. He was initially felt to have a possible CVA right vertebral artery stenosis and acute kidney injury. Neurology recommended aspirin and Plavix. They noted the wound on the left great toe felt that he had sepsis secondary to a diabetic left foot infection. An MRI was ordered that showed abnormal marrow edema in the distal and proximal phalanx of the great toe centered at  the interphalangeal joint the patient has an erosion at the head of the proximal phalanx of the great toe there was no joint effusion no other significant abnormalities. It was felt that this might represent either gout or osteomyelitis. The patient was seen by Dr. Gale Journey of infectious disease. He did receive ceftriaxone and Flagyl which was converted to Augmentin and doxycycline until today. He also has severe PAD. He has a follow-up with Dr. Gale Journey in early March on March 2 at 2 PM. At that point inflammatory markers assessment to see if he needs additional antibiotic therapy. 08/24/2020; the patient comes back to clinic after an almost 1 month hiatus. During this time he was admitted to hospital from 2/27 through 3/3. During this admission he was felt to more certainly have infection as the cause of this with  an elevated lactic acid level. X-ray of the foot was negative. Sedimentation rate of 55 CRP at 26.4. He underwent an angiogram by Dr. Trula Slade on 3/1 with balloon angioplasty of the left lower extremity anterior tibials. Infectious disease also saw the patient. He was discharged on Zyvox and Augmentin until 3/9 and then doxycycline and Augmentin until 3/19 which she is currently taking. He is on Plavix and aspirin. He is to follow-up with vascular surgery in 3 weeks or so for repeat vascular duplex ultrasound with ABI. They are using Prisma to the open areas on the left great toe. The patient denies any systemic symptoms or pain Objective Constitutional Sitting or standing Blood Pressure is within target range for patient.. Pulse regular and within target range for patient.Marland Kitchen Respirations regular, non-labored and within target range.. Temperature is normal and within the target range for the patient.Marland Kitchen Appears in no distress. Vitals Time Taken: 8:48 AM, Height: 73 in, Weight: 275 lbs, BMI: 36.3, Temperature: 97.8 F, Pulse: 91 bpm, Respiratory Rate: 18 breaths/min, Blood Pressure: 133/55 mmHg, Capillary  Blood Glucose: 138 mg/dl. Respiratory work of breathing is normal. Cardiovascular Pedal pulses absent bilaterally.. Musculoskeletal There is no palpable pain over the interphalangeal joint. General Notes: Wound exam; open wound on the dorsal interphalangeal joint of the left great toe. Once again this probes easily to bone. There is no obvious swelling of the interphalangeal joint itself. He also has a probing wound on the tip of the left great toe which is small Integumentary (Hair, Skin) Wound #1 status is Open. Original cause of wound was Blister. The date acquired was: 03/27/2020. The wound has been in treatment 20 weeks. The wound is located on the Left T Great. The wound measures 0.3cm length x 0.4cm width x 0.2cm depth; 0.094cm^2 area and 0.019cm^3 volume. There is bone and Fat oe Layer (Subcutaneous Tissue) exposed. There is no tunneling or undermining noted. There is a small amount of serosanguineous drainage noted. The wound margin is distinct with the outline attached to the wound base. There is medium (34-66%) red granulation within the wound bed. There is a small (1-33%) amount of necrotic tissue within the wound bed including Adherent Slough. Wound #2 status is Open. Original cause of wound was Pressure Injury. The date acquired was: 07/14/2020. The wound has been in treatment 6 weeks. The wound is located on the [employer]. The wound measures 1.7cm length x 0.9cm width x 0.1cm depth; 1.202cm^2 area and 0.12cm^3 volume. There oe is Fat Layer (Subcutaneous Tissue) exposed. There is no tunneling or undermining noted. There is a medium amount of serosanguineous drainage noted. The wound margin is flat and intact. There is large (67-100%) red granulation within the wound bed. There is a small (1-33%) amount of necrotic tissue within the wound bed including Adherent Slough. Plan Follow-up Appointments: Return Appointment in 2 weeks. Bathing/ Shower/ Hygiene: May shower and  wash wound with soap and water. Edema Control - Lymphedema / SCD / Other: Elevate legs to the level of the heart or above for 30 minutes daily and/or when sitting, a frequency of: - throughout the day Avoid standing for long periods of time. Patient to wear own compression stockings every day. - both legs Exercise regularly Moisturize legs daily. Off-Loading: Wound #1 Left T Great: oe Other: - use felt for offloading Wound #2 Left,Plantar T Great: oe Other: - use felt for offloading Home Health: New wound care orders this week; continue Home Health for wound care. May utilize formulary equivalent  dressing for wound treatment orders unless otherwise specified. - [place]; Home health to change Wednesday's and Friday's on weeks that pt. comes to [facility] and Monday's, wednesday's, and friday's on weeks that pt. does not come to [facility]. WOUND #1: - T Great Wound Laterality: Left oe Cleanser: Wound Cleanser Every Other Day/15 Days Discharge Instructions: Cleanse the wound with wound cleanser prior to applying a clean dressing using gauze sponges, not tissue or cotton balls. Cleanser: Soap and Water Every Other Day/15 Days Discharge Instructions: May shower and wash wound with dial antibacterial soap and water prior to dressing change. Cleanser: Normal Saline (Generic) Every Other Day/15 Days Discharge Instructions: Cleanse the wound with Normal Saline prior to applying a clean dressing using gauze sponges, not tissue or cotton balls. Prim Dressing: Promogran Prisma Matrix, 4.34 (sq in) (silver collagen) Every Other Day/15 Days ary Discharge Instructions: Moisten collagen with saline or hydrogel Secondary Dressing: Woven Gauze Sponge, Non-Sterile 4x4 in (Generic) Every Other Day/15 Days Discharge Instructions: Apply over primary dressing as directed. Secondary Dressing: Woven Gauze Sponges 2x2 in (Generic) Every Other Day/15 Days Discharge Instructions: Apply over primary dressing as  directed. Secondary Dressing: Optifoam Non-Adhesive Dressing, 4x4 in Every Other Day/15 Days Discharge Instructions: Use foam donut for offloading Secured With: Conforming Stretch Gauze Bandage, Sterile 2x75 (in/in) (Generic) Every Other Day/15 Days Discharge Instructions: Secure with stretch gauze as directed. Secured With: Paper T ape, 1x10 (in/yd) Every Other Day/15 Days Discharge Instructions: Secure dressing with tape as directed. WOUND #2: - T Great Wound Laterality: Plantar, Left oe Cleanser: Wound Cleanser Every Other Day/15 Days Discharge Instructions: Cleanse the wound with wound cleanser prior to applying a clean dressing using gauze sponges, not tissue or cotton balls. Cleanser: Soap and Water Every Other Day/15 Days Discharge Instructions: May shower and wash wound with dial antibacterial soap and water prior to dressing change. Cleanser: Normal Saline Every Other Day/15 Days Discharge Instructions: Cleanse the wound with Normal Saline prior to applying a clean dressing using gauze sponges, not tissue or cotton balls. Prim Dressing: Promogran Prisma Matrix, 4.34 (sq in) (silver collagen) Every Other Day/15 Days ary Discharge Instructions: Moisten collagen with saline or hydrogel Secondary Dressing: Woven Gauze Sponge, Non-Sterile 4x4 in (Generic) Every Other Day/15 Days Discharge Instructions: Apply over primary dressing as directed. Secondary Dressing: Woven Gauze Sponges 2x2 in (Generic) Every Other Day/15 Days Discharge Instructions: Apply over primary dressing as directed. Secondary Dressing: Optifoam Non-Adhesive Dressing, 4x4 in Every Other Day/15 Days Discharge Instructions: Use foam donut for offloading Secured With: Conforming Stretch Gauze Bandage, Sterile 2x75 (in/in) (Generic) Every Other Day/15 Days Discharge Instructions: Secure with stretch gauze as directed. Secured With: Paper Tape, 1x10 (in/yd) (Generic) Every Other Day/15 Days Discharge Instructions: Secure  dressing with tape as directed. 1. I continued with silver collagen to both wound areas #2 the daughter is asking about changing this daily and I think it can be changed every second day. Also asking about ambulation with physical therapy and as these wounds do not reflect the weightbearing surface he can ambulate as much is he is able to with careful attention to not putting pressure on the wound areas. 3. He still has exposed bone. I am not convinced about the adequacy of his revascularization at this point. 4. Follows up with Dr. Trula Slade in 1 month for repeat vascular studies. 5. This area has gotten him sick at least once and maybe twice. 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 This might be the best decision in this area if it does not progress towards closure 6. I did give some consideration to a trial of hyperbaric oxygen however I did not discuss this with him today Electronic Signature(s) Signed: 08/25/2020 5:48:16 PM By: Linton Ham MD Entered By: Linton Ham on 08/25/2020 10:13:49 -------------------------------------------------------------------------------- SuperBill Details Patient Name: Date of Service: Micheal Quails D. 08/25/2020 Medical Record Number: [id number] Patient Account Number: [id number] Date of Birth/Sex: Treating RN: 1936/05/20 (85 y.o. Micheal Walls, Micheal Walls Primary Care Provider: Minette Walls Other Clinician: Referring Provider: Treating Provider/Extender: Micheal Walls in Treatment: 20 Diagnosis Coding ICD-10 Codes Code Description E11.621 Type 2 diabetes mellitus with foot ulcer E11.51 Type 2 diabetes mellitus with diabetic peripheral angiopathy without gangrene L97.528 Non-pressure chronic ulcer of other part of left foot with other specified severity Facility Procedures CPT4 Code: [id number] Description: [id number] - WOUND CARE VISIT-LEV 5 EST  PT Modifier: Quantity: 1 Physician Procedures : CPT4 Code Description Modifier [id number] - WC PHYS LEVEL 4 - EST PT ICD-10 Diagnosis Description E11.621 Type 2 diabetes mellitus with foot ulcer E11.51 Type 2 diabetes mellitus with diabetic peripheral angiopathy without gangrene L97.528  Non-pressure chronic ulcer of other part of left foot with other specified severity Quantity: 1 Electronic Signature(s) Signed: 08/25/2020 5:48:16 PM By: Linton Ham MD Entered By: Linton Ham on 08/25/2020 10:14:29

## 2020-08-26 NOTE — Telephone Encounter (Signed)
  Chronic Care Management   Outreach Note  08/26/2020 Name: Micheal Walls MRN: [id number] DOB: 1935-11-16  Referred by: [name], [name] Reason for referral : Chronic Care Management (Initial RN CM Call Attempt)   An unsuccessful telephone outreach was attempted today. The patient was referred to the case management team for assistance with care management and care coordination.   Follow Up Plan: A HIPAA compliant phone message was left for the patient providing contact information and requesting a return call. Telephone follow up appointment with care management team member scheduled for: 09/17/20  Barb Merino, RN, BSN, CCM Care Management Coordinator [facility]  Direct Phone: [phone]

## 2020-08-26 NOTE — Progress Notes (Signed)
MAXX, PHAM ([id number]) Visit Report for 08/25/2020 Arrival Information Details Patient Name: Date of Service: Micheal Walls, Micheal Walls 08/25/2020 9:15 A M Medical Record Number: [id number] Patient Account Number: [id number] Date of Birth/Sex: Treating RN: Nov 26, 1935 (85 y.o. [name], [name] Primary Care Rodneshia Greenhouse: [name] Other Clinician: Referring Elon Eoff: Treating Sherly Brodbeck/Extender: Lynden Ang in Treatment: 49 Visit Information History Since Last Visit Added or deleted any medications: No Patient Arrived: Wheel Chair Any new allergies or adverse reactions: No Arrival Time: 08:48 Had a fall or experienced change in No Accompanied By: wife activities of daily living that may affect Transfer Assistance: None risk of falls: Patient Identification Verified: Yes Signs or symptoms of abuse/neglect since last visito No Secondary Verification Process Completed: Yes Hospitalized since last visit: No Patient Requires Transmission-Based Precautions: No Implantable device outside of the clinic excluding No Patient Has Alerts: No cellular tissue based products placed in the center since last visit: Has Dressing in Place as Prescribed: Yes Pain Present Now: No Electronic Signature(s) Signed: 08/25/2020 3:44:20 PM By: Sandre Kitty Entered By: Sandre Kitty on 08/25/2020 08:48:38 -------------------------------------------------------------------------------- Clinic Level of Care Assessment Details Patient Name: Date of Service: Micheal Walls, Micheal Walls 08/25/2020 9:15 A M Medical Record Number: [id number] Patient Account Number: [id number] Date of Birth/Sex: Treating RN: 1936-03-09 (85 y.o. [name], [name] Primary Care Miliyah Luper: [name] Other Clinician: Referring Ltanya Bayley: Treating Acea Yagi/Extender: Lynden Ang in Treatment: 20 Clinic Level of Care Assessment Items TOOL 4 Quantity Score X- 1 0 Use when only  an EandM is performed on FOLLOW-UP visit ASSESSMENTS - Nursing Assessment / Reassessment X- 1 10 Reassessment of Co-morbidities (includes updates in patient status) X- 1 5 Reassessment of Adherence to Treatment Plan ASSESSMENTS - Wound and Skin A ssessment / Reassessment X - Simple Wound Assessment / Reassessment - one wound 1 5 []  - 0 Complex Wound Assessment / Reassessment - multiple wounds X- 1 10 Dermatologic / Skin Assessment (not related to wound area) ASSESSMENTS - Focused Assessment X- 1 5 Circumferential Edema Measurements - multi extremities X- 1 10 Nutritional Assessment / Counseling / Intervention X- 1 5 Lower Extremity Assessment (monofilament, tuning fork, pulses) []  - 0 Peripheral Arterial Disease Assessment (using hand held doppler) ASSESSMENTS - Ostomy and/or Continence Assessment and Care []  - 0 Incontinence Assessment and Management []  - 0 Ostomy Care Assessment and Management (repouching, etc.) PROCESS - Coordination of Care []  - 0 Simple Patient / Family Education for ongoing care X- 1 20 Complex (extensive) Patient / Family Education for ongoing care X- 1 10 Staff obtains Programmer, systems, Records, T Results / Process Orders est X- 1 10 Staff telephones HHA, Nursing Homes / Clarify orders / etc []  - 0 Routine Transfer to another Facility (non-emergent condition) []  - 0 Routine Hospital Admission (non-emergent condition) []  - 0 New Admissions / Biomedical engineer / Ordering NPWT Apligraf, etc. , []  - 0 Emergency Hospital Admission (emergent condition) X- 1 10 Simple Discharge Coordination []  - 0 Complex (extensive) Discharge Coordination PROCESS - Special Needs []  - 0 Pediatric / Minor Patient Management []  - 0 Isolation Patient Management []  - 0 Hearing / Language / Visual special needs []  - 0 Assessment of Community assistance (transportation, D/C planning, etc.) []  - 0 Additional assistance / Altered mentation []  - 0 Support  Surface(s) Assessment (bed, cushion, seat, etc.) INTERVENTIONS - Wound Cleansing / Measurement []  - 0 Simple Wound Cleansing - one wound X- 2 5 Complex Wound Cleansing - multiple wounds X-  1 5 Wound Imaging (photographs - any number of wounds) $RemoveBe'[]'AyVqcyhXv$  - 0 Wound Tracing (instead of photographs) $RemoveBeforeD'[]'tIFeGNhdKUZiMx$  - 0 Simple Wound Measurement - one wound X- 2 5 Complex Wound Measurement - multiple wounds INTERVENTIONS - Wound Dressings $RemoveBeforeD'[]'ocpdsMZXwLrHzF$  - 0 Small Wound Dressing one or multiple wounds X- 2 15 Medium Wound Dressing one or multiple wounds $RemoveBeforeD'[]'ZAPBqBkSfqZrSS$  - 0 Large Wound Dressing one or multiple wounds X- 1 5 Application of Medications - topical $RemoveB'[]'pMaxadEi$  - 0 Application of Medications - injection INTERVENTIONS - Miscellaneous $RemoveBeforeD'[]'TLNJLEgUGgyrGK$  - 0 External ear exam $Remove'[]'RvPhmtz$  - 0 Specimen Collection (cultures, biopsies, blood, body fluids, etc.) $RemoveBefor'[]'SNydxUohzzdU$  - 0 Specimen(s) / Culture(s) sent or taken to Lab for analysis $RemoveBefo'[]'AhkRnNltCbI$  - 0 Patient Transfer (multiple staff / Civil Service fast streamer / Similar devices) $RemoveBeforeDE'[]'PskHTUOlvLvcjVy$  - 0 Simple Staple / Suture removal (25 or less) $Remove'[]'tAdwxAd$  - 0 Complex Staple / Suture removal (26 or more) $Remove'[]'fjpHIED$  - 0 Hypo / Hyperglycemic Management (close monitor of Blood Glucose) $RemoveBefore'[]'CRYSqqZOhTvhe$  - 0 Ankle / Brachial Index (ABI) - do not check if billed separately X- 1 5 Vital Signs Has the patient been seen at the hospital within the last three years: Yes Total Score: 165 Level Of Care: New/Established - Level 5 Electronic Signature(s) Signed: 08/26/2020 5:17:38 PM By: Rhae Hammock RN Entered By: Rhae Hammock on 08/25/2020 10:00:43 -------------------------------------------------------------------------------- Encounter Discharge Information Details Patient Name: Date of Service: Micheal Quails D. 08/25/2020 9:15 A M Medical Record Number: [id number] Patient Account Number: [id number] Date of Birth/Sex: Treating RN: 1935-07-20 (85 y.o. [name], [name] Primary Care Vaunda Gutterman: [name] Other Clinician: Referring Gradyn Shein: Treating  Josemanuel Eakins/Extender: Lynden Ang in Treatment: 20 Encounter Discharge Information Items Discharge Condition: Stable Ambulatory Status: Wheelchair Discharge Destination: Home Transportation: Private Auto Accompanied By: wife Schedule Follow-up Appointment: Yes Clinical Summary of Care: Provided on 08/25/2020 Form Type Recipient Paper Patient Patient Electronic Signature(s) Signed: 08/25/2020 11:30:57 AM By: Lorrin Jackson Entered By: Lorrin Jackson on 08/25/2020 11:30:57 -------------------------------------------------------------------------------- Lower Extremity Assessment Details Patient Name: Date of Service: Micheal Quails D. 08/25/2020 9:15 A M Medical Record Number: [id number] Patient Account Number: [id number] Date of Birth/Sex: Treating RN: [birth year] (85 y.o. [name] Primary Care Lyndon Chapel: [name] Other Clinician: Referring Yuridiana Formanek: Treating Dionta Larke/Extender: Lynden Ang in Treatment: 20 Edema Assessment Assessed: Shirlyn Goltz: No] [Right: No] Edema: [Left: Ye] [Right: s] Calf Left: Right: Point of Measurement: 40 cm From Medial Instep 45 cm Ankle Left: Right: Point of Measurement: 13 cm From Medial Instep 31.5 cm Vascular Assessment Pulses: Dorsalis Pedis Palpable: [Left:No] Electronic Signature(s) Signed: 08/25/2020 5:54:58 PM By: Baruch Gouty RN, BSN Entered By: Baruch Gouty on 08/25/2020 09:19:58 -------------------------------------------------------------------------------- Multi Wound Chart Details Patient Name: Date of Service: Micheal Quails D. 08/25/2020 9:15 A M Medical Record Number: [id number] Patient Account Number: [id number] Date of Birth/Sex: Treating RN: [birth year] (85 y.o. [name], [name] Primary Care Khushi Zupko: [name] Other Clinician: Referring Kalaya Infantino: Treating Jolanda Mccann/Extender: Lynden Ang in Treatment: 20 Vital  Signs Height(in): 32 Capillary Blood Glucose(mg/dl): 138 Weight(lbs): 275 Pulse(bpm): 44 Body Mass Index(BMI): 11 Blood Pressure(mmHg): 133/55 Temperature(F): 97.8 Respiratory Rate(breaths/min): 18 Photos: [1:No Photos Left T Great oe] [2:No Photos Left, Plantar T Great oe] [N/A:N/A N/A] Wound Location: [1:Blister] [2:Pressure Injury] [N/A:N/A] Wounding Event: [1:Diabetic Wound/Ulcer of the Lower] [2:Diabetic Wound/Ulcer of the Lower] [N/A:N/A] Primary Etiology: [1:Extremity Cataracts, Anemia, Lymphedema,] [2:Extremity Cataracts, Anemia, Lymphedema,] [N/A:N/A] Comorbid History: [1:Sleep Apnea, Congestive Heart Failure, Hypertension, Peripheral Arterial Disease, Peripheral Venous Arterial Disease, Peripheral Venous Disease,  Type II Diabetes, End Stage Disease, Type II Diabetes, End Stage Renal Disease, Gout,  Neuropathy 03/27/2020] [2:Sleep Apnea, Congestive Heart Failure, Hypertension, Peripheral Renal Disease, Gout, Neuropathy 07/14/2020] [N/A:N/A] Date Acquired: [1:20] [2:6] [N/A:N/A] Weeks of Treatment: [1:Open] [2:Open] [N/A:N/A] Wound Status: [1:0.3x0.4x0.2] [2:1.7x0.9x0.1] [N/A:N/A] Measurements L x W x D (cm) [1:0.094] [2:1.202] [N/A:N/A] A (cm) : rea [1:0.019] [2:0.12] [N/A:N/A] Volume (cm) : [1:-203.20%] [2:-752.50%] [N/A:N/A] % Reduction in A rea: [1:0.00%] [2:-757.10%] [N/A:N/A] % Reduction in Volume: [1:Grade 2] [2:Grade 2] [N/A:N/A] Classification: [1:Small] [2:Medium] [N/A:N/A] Exudate A mount: [1:Serosanguineous] [2:Serosanguineous] [N/A:N/A] Exudate Type: [1:red, brown] [2:red, brown] [N/A:N/A] Exudate Color: [1:Distinct, outline attached] [2:Flat and Intact] [N/A:N/A] Wound Margin: [1:Medium (34-66%)] [2:Large (67-100%)] [N/A:N/A] Granulation A mount: [1:Red] [2:Red] [N/A:N/A] Granulation Quality: [1:Small (1-33%)] [2:Small (1-33%)] [N/A:N/A] Necrotic A mount: [1:Fat Layer (Subcutaneous Tissue): Yes Fat Layer (Subcutaneous Tissue): Yes N/A] Exposed  Structures: [1:Bone: Yes Fascia: No Tendon: No Muscle: No Joint: No Small (1-33%)] [2:Fascia: No Tendon: No Muscle: No Joint: No Bone: No Small (1-33%)] [N/A:N/A] Treatment Notes Electronic Signature(s) Signed: 08/25/2020 5:48:16 PM By: Linton Ham MD Signed: 08/26/2020 5:17:38 PM By: Rhae Hammock RN Signed: 08/26/2020 5:17:38 PM By: Rhae Hammock RN Entered By: Linton Ham on 08/25/2020 10:06:24 -------------------------------------------------------------------------------- Multi-Disciplinary Care Plan Details Patient Name: Date of Service: Micheal Quails D. 08/25/2020 9:15 A M Medical Record Number: [id number] Patient Account Number: [id number] Date of Birth/Sex: Treating RN: 1935-10-23 (85 y.o. [name], [name] Primary Care Joven Mom: [name] Other Clinician: Referring Brode Sculley: Treating Kelsey Edman/Extender: Lynden Ang in Treatment: Prentiss reviewed with physician Active Inactive Wound/Skin Impairment Nursing Diagnoses: Impaired tissue integrity Knowledge deficit related to ulceration/compromised skin integrity Goals: Patient/caregiver will verbalize understanding of skin care regimen Date Initiated: 04/03/2020 Target Resolution Date: 09/11/2020 Goal Status: Active Ulcer/skin breakdown will have a volume reduction of 50% by week 8 Date Initiated: 04/03/2020 Date Inactivated: 05/15/2020 Target Resolution Date: 05/01/2020 Goal Status: Unmet Unmet Reason: offloading issue Ulcer/skin breakdown will have a volume reduction of 80% by week 12 Date Initiated: 05/15/2020 Target Resolution Date: 09/11/2020 Goal Status: Active Interventions: Assess patient/caregiver ability to obtain necessary supplies Assess patient/caregiver ability to perform ulcer/skin care regimen upon admission and as needed Assess ulceration(s) every visit Provide education on ulcer and skin care Treatment Activities: Skin care regimen  initiated : 04/03/2020 Topical wound management initiated : 04/03/2020 Notes: Electronic Signature(s) Signed: 08/26/2020 5:17:38 PM By: Rhae Hammock RN Entered By: Rhae Hammock on 08/25/2020 09:57:36 -------------------------------------------------------------------------------- Pain Assessment Details Patient Name: Date of Service: Micheal Quails D. 08/25/2020 9:15 A M Medical Record Number: [id number] Patient Account Number: [id number] Date of Birth/Sex: Treating RN: 1936/03/12 (85 y.o. [name] Primary Care Laakea Pereira: [name] Other Clinician: Referring Thomasine Klutts: Treating Carleigh Buccieri/Extender: Lynden Ang in Treatment: 20 Active Problems Location of Pain Severity and Description of Pain Patient Has Paino No Site Locations Rate the pain. Current Pain Level: 0 Pain Management and Medication Current Pain Management: Electronic Signature(s) Signed: 08/25/2020 5:54:58 PM By: Baruch Gouty RN, BSN Signed: 08/26/2020 5:17:38 PM By: Rhae Hammock RN Entered By: Baruch Gouty on 08/25/2020 09:18:24 -------------------------------------------------------------------------------- Patient/Caregiver Education Details Patient Name: Date of Service: Micheal Birks. 3/15/2022andnbsp9:15 A M Medical Record Number: [id number] Patient Account Number: [id number] Date of Birth/Gender: Treating RN: Jul 07, 1935 (85 y.o. [name] Primary Care Physician: [name] Other Clinician: Referring Physician: Treating Physician/Extender: Lynden Ang in Treatment: 20 Education Assessment Education Provided To: Patient Education Topics Provided Wound/Skin Impairment: Methods: Explain/Verbal  Responses: State content correctly Electronic Signature(s) Signed: 08/26/2020 5:17:38 PM By: Rhae Hammock RN Entered By: Rhae Hammock on 08/25/2020  09:57:58 -------------------------------------------------------------------------------- Wound Assessment Details Patient Name: Date of Service: Micheal Quails D. 08/25/2020 9:15 A M Medical Record Number: [id number] Patient Account Number: [id number] Date of Birth/Sex: Treating RN: 26-Oct-1935 (85 y.o. [name], [name] Primary Care Beverlyn Mcginness: [name] Other Clinician: Referring Sid Greener: Treating Esperanza Madrazo/Extender: Lynden Ang in Treatment: 20 Wound Status Wound Number: 1 Primary Diabetic Wound/Ulcer of the Lower Extremity Etiology: Wound Location: Left T Great oe Wound Open Wounding Event: Blister Status: Date Acquired: 03/27/2020 Comorbid Cataracts, Anemia, Lymphedema, Sleep Apnea, Congestive Heart Weeks Of Treatment: 20 History: Failure, Hypertension, Peripheral Arterial Disease, Peripheral Venous Clustered Wound: No Disease, Type II Diabetes, End Stage Renal Disease, Gout, Neuropathy Photos Wound Measurements Length: (cm) 0.3 Width: (cm) 0.4 Depth: (cm) 0.2 Area: (cm) 0.094 Volume: (cm) 0.019 % Reduction in Area: -203.2% % Reduction in Volume: 0% Epithelialization: Small (1-33%) Tunneling: No Undermining: No Wound Description Classification: Grade 2 Wound Margin: Distinct, outline attached Exudate Amount: Small Exudate Type: Serosanguineous Exudate Color: red, brown Foul Odor After Cleansing: No Slough/Fibrino No Wound Bed Granulation Amount: Medium (34-66%) Exposed Structure Granulation Quality: Red Fascia Exposed: No Necrotic Amount: Small (1-33%) Fat Layer (Subcutaneous Tissue) Exposed: Yes Necrotic Quality: Adherent Slough Tendon Exposed: No Muscle Exposed: No Joint Exposed: No Bone Exposed: Yes Treatment Notes Wound #1 (Toe Great) Wound Laterality: Left Cleanser Wound Cleanser Discharge Instruction: Cleanse the wound with wound cleanser prior to applying a clean dressing using gauze sponges, not tissue or  cotton balls. Soap and Water Discharge Instruction: May shower and wash wound with dial antibacterial soap and water prior to dressing change. Normal Saline Discharge Instruction: Cleanse the wound with Normal Saline prior to applying a clean dressing using gauze sponges, not tissue or cotton balls. Peri-Wound Care Topical Primary Dressing Promogran Prisma Matrix, 4.34 (sq in) (silver collagen) Discharge Instruction: Moisten collagen with saline or hydrogel Secondary Dressing Woven Gauze Sponge, Non-Sterile 4x4 in Discharge Instruction: Apply over primary dressing as directed. Woven Gauze Sponges 2x2 in Discharge Instruction: Apply over primary dressing as directed. Optifoam Non-Adhesive Dressing, 4x4 in Discharge Instruction: Use foam donut for offloading Secured With Conforming Stretch Gauze Bandage, Sterile 2x75 (in/in) Discharge Instruction: Secure with stretch gauze as directed. Paper Tape, 1x10 (in/yd) Discharge Instruction: Secure dressing with tape as directed. Compression Wrap Compression Stockings Add-Ons Electronic Signature(s) Signed: 08/25/2020 5:27:17 PM By: Sandre Kitty Signed: 08/26/2020 5:17:38 PM By: Rhae Hammock RN Entered By: Sandre Kitty on 08/25/2020 16:50:51 -------------------------------------------------------------------------------- Wound Assessment Details Patient Name: Date of Service: Micheal Quails D. 08/25/2020 9:15 A M Medical Record Number: [id number] Patient Account Number: [id number] Date of Birth/Sex: Treating RN: 30-Dec-1935 (85 y.o. [name], [name] Primary Care Dameir Gentzler: [name] Other Clinician: Referring Renda Pohlman: Treating Brennley Curtice/Extender: Lynden Ang in Treatment: 20 Wound Status Wound Number: 2 Primary Diabetic Wound/Ulcer of the Lower Extremity Etiology: Wound Location: Left, Plantar T Great oe Wound Open Wounding Event: Pressure Injury Status: Date Acquired:  07/14/2020 Comorbid Cataracts, Anemia, Lymphedema, Sleep Apnea, Congestive Heart Weeks Of Treatment: 6 History: Failure, Hypertension, Peripheral Arterial Disease, Peripheral Venous Clustered Wound: No Disease, Type II Diabetes, End Stage Renal Disease, Gout, Neuropathy Photos Wound Measurements Length: (cm) 1.7 Width: (cm) 0.9 Depth: (cm) 0.1 Area: (cm) 1.202 Volume: (cm) 0.12 % Reduction in Area: -752.5% % Reduction in Volume: -757.1% Epithelialization: Small (1-33%) Tunneling: No Undermining: No Wound Description Classification: Grade 2 Wound Margin:  Flat and Intact Exudate Amount: Medium Exudate Type: Serosanguineous Exudate Color: red, brown Foul Odor After Cleansing: No Slough/Fibrino No Wound Bed Granulation Amount: Large (67-100%) Exposed Structure Granulation Quality: Red Fascia Exposed: No Necrotic Amount: Small (1-33%) Fat Layer (Subcutaneous Tissue) Exposed: Yes Necrotic Quality: Adherent Slough Tendon Exposed: No Muscle Exposed: No Joint Exposed: No Bone Exposed: No Treatment Notes Wound #2 (Toe Great) Wound Laterality: Plantar, Left Cleanser Wound Cleanser Discharge Instruction: Cleanse the wound with wound cleanser prior to applying a clean dressing using gauze sponges, not tissue or cotton balls. Soap and Water Discharge Instruction: May shower and wash wound with dial antibacterial soap and water prior to dressing change. Normal Saline Discharge Instruction: Cleanse the wound with Normal Saline prior to applying a clean dressing using gauze sponges, not tissue or cotton balls. Peri-Wound Care Topical Primary Dressing Promogran Prisma Matrix, 4.34 (sq in) (silver collagen) Discharge Instruction: Moisten collagen with saline or hydrogel Secondary Dressing Woven Gauze Sponge, Non-Sterile 4x4 in Discharge Instruction: Apply over primary dressing as directed. Woven Gauze Sponges 2x2 in Discharge Instruction: Apply over primary dressing as  directed. Optifoam Non-Adhesive Dressing, 4x4 in Discharge Instruction: Use foam donut for offloading Secured With Conforming Stretch Gauze Bandage, Sterile 2x75 (in/in) Discharge Instruction: Secure with stretch gauze as directed. Paper Tape, 1x10 (in/yd) Discharge Instruction: Secure dressing with tape as directed. Compression Wrap Compression Stockings Add-Ons Electronic Signature(s) Signed: 08/25/2020 5:27:17 PM By: Sandre Kitty Signed: 08/26/2020 5:17:38 PM By: Rhae Hammock RN Entered By: Sandre Kitty on 08/25/2020 16:50:30 -------------------------------------------------------------------------------- Vitals Details Patient Name: Date of Service: Micheal Quails D. 08/25/2020 9:15 A M Medical Record Number: [id number] Patient Account Number: [id number] Date of Birth/Sex: Treating RN: [birth year] (85 y.o. [name], [name] Primary Care Ciria Bernardini: [name] Other Clinician: Referring Asuzena Weis: Treating Louden Houseworth/Extender: Lynden Ang in Treatment: 20 Vital Signs Time Taken: 08:48 Temperature (F): 97.8 Height (in): 73 Pulse (bpm): 91 Weight (lbs): 275 Respiratory Rate (breaths/min): 18 Body Mass Index (BMI): 36.3 Blood Pressure (mmHg): 133/55 Capillary Blood Glucose (mg/dl): 138 Reference Range: 80 - 120 mg / dl Electronic Signature(s) Signed: 08/25/2020 3:44:20 PM By: Sandre Kitty Entered By: Sandre Kitty on 08/25/2020 08:49:01

## 2020-08-27 ENCOUNTER — Ambulatory Visit (INDEPENDENT_AMBULATORY_CARE_PROVIDER_SITE_OTHER): Payer: Medicare Other | Admitting: Internal Medicine

## 2020-08-27 ENCOUNTER — Encounter: Payer: Self-pay | Admitting: Internal Medicine

## 2020-08-27 ENCOUNTER — Other Ambulatory Visit: Payer: Self-pay

## 2020-08-27 ENCOUNTER — Ambulatory Visit: Payer: Medicare Other

## 2020-08-27 VITALS — BP 118/63 | HR 71 | Temp 98.0°F

## 2020-08-27 DIAGNOSIS — I10 Essential (primary) hypertension: Secondary | ICD-10-CM

## 2020-08-27 DIAGNOSIS — L03116 Cellulitis of left lower limb: Secondary | ICD-10-CM | POA: Diagnosis not present

## 2020-08-27 DIAGNOSIS — L089 Local infection of the skin and subcutaneous tissue, unspecified: Secondary | ICD-10-CM

## 2020-08-27 DIAGNOSIS — E1159 Type 2 diabetes mellitus with other circulatory complications: Secondary | ICD-10-CM

## 2020-08-27 DIAGNOSIS — E11628 Type 2 diabetes mellitus with other skin complications: Secondary | ICD-10-CM

## 2020-08-27 DIAGNOSIS — M869 Osteomyelitis, unspecified: Secondary | ICD-10-CM | POA: Diagnosis not present

## 2020-08-27 DIAGNOSIS — Z794 Long term (current) use of insulin: Secondary | ICD-10-CM

## 2020-08-27 DIAGNOSIS — E782 Mixed hyperlipidemia: Secondary | ICD-10-CM

## 2020-08-27 NOTE — Progress Notes (Signed)
Chronic Care Management Pharmacy Note  09/09/2020 Name:  Micheal Walls MRN:  [id number] DOB:  April 15, 1936  Subjective: Micheal Walls is an 85 y.o. year old male who is a primary patient of [name], [place].  The CCM team was consulted for assistance with disease management and care coordination needs.  Daughter reports he was living in [place] and when he retired he moved back to [place]. He lost his leg, and it was hard for him to do things for himself. He has lost weight but it is a lot on him. He has 6 children and she reports that each child should be visiting him every 2 months. She is the oldest sister and has been back and forth with her sister for over a year. She reports that he prefers to do old remedies. She has medical power of attorney and she is concerned about the amount medications that he is taking. She reports that he is taking all the medications in the morning and another handful at night. She asked him about getting a machine that he is not interested. His nephew lives with him and he is 72 and he will do things for her father but not all the time. The intention was for someone to be at house with them. Her father does take the [facility] house. His wife is 48 years old and she has some dementia. She is concerned about her cognitive state as well. She reports that she has a brother who lives in [place], [place] and makes sure they have decent meals. Her brothers name is [name]. Breakfast is usually at noon for Mr. Catalfamo. Ms. [name] has been doing the wound care. A nurse will be there every other day to change the dressing through [facility], on the weeks that he does not go to the wound center and the days he will have a nurse once a week. Daughter reports that her father stopped some of the medication when he was started on antibiotics she would like be sure that he is on the correct medication. She also reports that the Kidney doctor reported that his kidneys got worse.   Collaboration with  daughter [name]  for initial visit in response to provider referral for pharmacy case management and/or care coordination services.   Consent to Services:  The patient was given the following information about Chronic Care Management services today, agreed to services, and gave verbal consent: 1. CCM service includes personalized support from designated clinical staff supervised by the primary care provider, including individualized plan of care and coordination with other care providers 2. 24/7 contact phone numbers for assistance for urgent and routine care needs. 3. Service will only be billed when office clinical staff spend 20 minutes or more in a month to coordinate care. 4. Only one practitioner may furnish and bill the service in a calendar month. 5.The patient may stop CCM services at any time (effective at the end of the month) by phone call to the office staff. 6. The patient will be responsible for cost sharing (co-pay) of up to 20% of the service fee (after annual deductible is met). Patient agreed to services and consent obtained.  Patient Care Team: [name], [name] as PCP - General (General Practice) [name], [name], MD as PCP - Cardiology (Cardiology) Rex Kras, Claudette Stapler, RN as [place] [facility] [place], Tillie Rung as [place], Sharyn Blitz, [facility] (Pharmacist)  Recent office visits:  08/17/2020 PCP OV  Recent consult visits: 08/27/2020 - Infectious disease OV,  No evidence of cellulitis, 1 more week of doxycycline and amoxicillin-clavunate  - finish what he has in the bottle ( finished last dose on 09/01/2020)  08/26/2020 [facility] Dr. Joanne Chars - [facility] visits:   Objective:  Lab Results  Component Value Date   CREATININE 1.85 (H) 08/27/2020   BUN 64 (H) 08/27/2020   GFRNONAA 58 (L) 08/13/2020   GFRAA 42 (L) 05/20/2020   NA 136 08/27/2020   K 4.9 08/27/2020   CALCIUM 10.4 (H)  08/27/2020   CO2 28 08/27/2020    Lab Results  Component Value Date/Time   HGBA1C 6.1 (H) 07/19/2020 03:30 AM   HGBA1C 6.2 (H) 05/20/2020 09:51 AM   MICROALBUR 80 05/15/2019 10:28 AM   MICROALBUR 30 03/15/2018 02:06 PM    Last diabetic Eye exam:  Lab Results  Component Value Date/Time   HMDIABEYEEXA Retinopathy (A) 07/10/2020 12:00 AM    Last diabetic Foot exam: No results found for: HMDIABFOOTEX   Lab Results  Component Value Date   CHOL 90 07/19/2020   HDL 38 (L) 07/19/2020   LDLCALC 43 07/19/2020   TRIG 44 07/19/2020   CHOLHDL 2.4 07/19/2020    Hepatic Function Latest Ref Rng & Units 08/27/2020 08/17/2020 08/09/2020  Total Protein 6.1 - 8.1 g/dL 7.1 6.8 6.4(L)  Albumin 3.6 - 4.6 g/dL - 3.6 3.1(L)  AST 10 - 35 U/L 29 42(H) 28  ALT 9 - 46 U/L 44 57(H) 20  Alk Phosphatase 44 - 121 IU/L - 80 42  Total Bilirubin 0.2 - 1.2 mg/dL 0.4 0.3 0.9  Bilirubin, Direct 0.00 - 0.40 mg/dL - - -    Lab Results  Component Value Date/Time   TSH 2.340 01/23/2020 02:54 PM   TSH 2.760 03/21/2019 10:35 AM    CBC Latest Ref Rng & Units 08/27/2020 08/17/2020 08/13/2020  WBC 3.8 - 10.8 Thousand/uL 5.7 8.8 8.2  Hemoglobin 13.2 - 17.1 g/dL 9.9(L) 9.6(L) 9.6(L)  Hematocrit 38.5 - 50.0 % 29.0(L) 29.3(L) 28.9(L)  Platelets 140 - 400 Thousand/uL 224 135(L) 144(L)    No results found for: VD25OH  Clinical ASCVD:  The ASCVD Risk score [name] DC Jr., et al., 2013) failed to calculate for the following reasons:   The 2013 ASCVD risk score is only valid for ages 63 to 30   The patient has a prior MI or stroke diagnosis    Depression screen [facility] 2/9 05/20/2020 09/24/2019 06/25/2019  Decreased Interest 0 0 0  Down, Depressed, Hopeless 0 0 0  PHQ - 2 Score 0 0 0  Altered sleeping - - -  Tired, decreased energy - - -  Change in appetite - - -  Feeling bad or failure about yourself  - - -  Trouble concentrating - - -  Moving slowly or fidgety/restless - - -  Suicidal thoughts - - -  PHQ-9 Score - - -   Difficult doing work/chores - - -  Some recent data might be hidden        Social History   Tobacco Use  Smoking Status Former Smoker  . Types: Cigars  . Quit date: 06/12/2001  . Years since quitting: 19.2  Smokeless Tobacco Never Used   BP Readings from Last 3 Encounters:  09/04/20 115/80  09/01/20 (!) 119/52  08/27/20 118/63   Pulse Readings from Last 3 Encounters:  09/04/20 81  09/01/20 78  08/27/20 71   Wt Readings from Last 3  Encounters:  09/04/20 253 lb 9.6 oz (115 kg)  08/17/20 280 lb (127 kg)  08/09/20 280 lb (127 kg)    Assessment/Interventions: Review of patient past medical history, allergies, medications, health status, including review of consultants reports, laboratory and other test data, was performed as part of comprehensive evaluation and provision of chronic care management services.   SDOH:  (Social Determinants of Health) assessments and interventions performed: Yes   CCM Care Plan  Allergies  Allergen Reactions  . Vancomycin Rash    Medications Reviewed Today    Reviewed by Tilda Franco (Technician) on 09/07/20 at [place] List Status: <None>  Medication Order Taking? Sig Documenting Provider Last Dose Status Informant  aspirin EC 81 MG tablet [id number] No Take 81 mg by mouth daily. Swallow whole. [provider] Taking Active Family Member  [facility] Modified 1.5 % LIQD [id number] No Apply between toes as needed for maceration. [name], DPM Taking Active   cephALEXin (KEFLEX) 500 MG capsule [id number]  Take 1 capsule (500 mg total) by mouth 2 (two) times daily. Gabriel Earing, PA-C  Active   colchicine 0.6 MG tablet [id number] No Take 0.6 mg by mouth daily as needed (gout flare up). [provider] Taking Active   Continuous Blood Gluc Sensor (FREESTYLE LIBRE [place]) [place] [id number] No Use as directed to check blood sugars [name], [name] Taking Active Family Member  ferrous sulfate 325  (65 FE) MG tablet [id number] No Take 325 mg by mouth every Monday, Wednesday, and Friday. [provider] Taking Active Family Member  fish oil-omega-3 fatty acids 1000 MG capsule [id number] No Take 2 g by mouth daily. [provider] Taking Active Family Member  furosemide (LASIX) 40 MG tablet [id number] No Take 2 tabs in morning and 1 tab evening  Patient taking differently: Take 40-80 mg by mouth 2 (two) times daily. 80 mg in the morning and 40 mg at night   [name], [name] Taking Active Family Member           Med Note Sunday Corn Aug 09, 2020 12:30 PM) 80 mg in the morning & 40 at bedtime   gabapentin (NEURONTIN) 100 MG capsule [id number] No Take 2 capsules in am 1 capsule mid day and 1 capsule in evening [name], [name] Taking Active   glucose blood (FREESTYLE PRECISION NEO TEST) test strip [id number] No Use as instructed [name], [name] Taking Active Family Member  latanoprost (XALATAN) 0.005 % ophthalmic solution [id number] No Place 1 drop into both eyes at bedtime.  [provider] Taking Active Family Member  losartan (COZAAR) 100 MG tablet [id number] No Take 100 mg by mouth daily. [provider] Taking Active Child  Multiple Vitamin (MULTIVITAMIN WITH MINERALS) TABS tablet [id number] No Take 1 tablet by mouth daily. [provider] Taking Active Family Member  pantoprazole (PROTONIX) 40 MG tablet [id number] No Take 1 tablet  by mouth daily.  Patient taking differently: Take 40 mg by mouth at bedtime.   [name], [name] Taking Active   Polyvinyl Alcohol-Povidone [facility] OP) [id number] No Place 1 drop into both eyes daily as needed (dry eyes). [provider] Taking Active Family Member  rosuvastatin (CRESTOR) 10 MG tablet [id number] No Take 1 tablet (10 mg total) by mouth daily.  Patient taking differently: Take 10 mg by mouth every 7 (seven) days. Wednesday of each week   Bonnielee Haff, MD Taking Active   Semaglutide, 1  MG/DOSE, (OZEMPIC, [name],) 2 MG/1.5ML SOPN [id number] No Inject 1 mg into the skin once a week.  Patient taking differently: Inject 1 mg into the skin every Wednesday.   [name], [name] Taking Active            Med Note Duffy Bruce, Para [place] Jan 16, 2020  8:46 PM)    tamsulosin (FLOMAX) 0.4 MG CAPS capsule [id number] No Take 0.4 mg by mouth daily. [provider] Taking Active   Med List Note Dessie Coma, CPhT 01/16/20 2037): [facility]) [phone]          Patient Active Problem List   Diagnosis Date Noted  . 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[place])   . Left ankle swelling   . Diabetic foot infection ([place])   . Cerebral thrombosis with cerebral infarction 07/20/2020  . Thrombocytopenia ([place]) 07/19/2020  . Cerebral ischemia 07/18/2020  . Bilateral hearing loss 05/20/2020  . Does use hearing aid 05/20/2020  . Diabetic ulcer of left great toe ([place]) 01/16/2020  . Controlled type 2 diabetes mellitus with stable proliferative retinopathy of both eyes, with long-term current use of insulin ([place]) 01/06/2020  . Intermediate stage nonexudative age-related macular degeneration of both eyes 01/06/2020  . Left epiretinal membrane 01/06/2020  . [place] of right macula 01/06/2020  . AKI (acute kidney injury) ([place]) 11/02/2019  . Cellulitis 11/02/2019  . Elevated TSH 07/10/2018  . Nephropathy 02/28/2018  . Disturbance of skin sensation 02/28/2018  . CKD (chronic kidney disease) stage 4, GFR 15-29 ml/min (HCC) 07/17/2017  . PAD (peripheral artery disease) ([place]) 07/17/2017  . 110.1 10/21/2013  . Epiphora 04/24/2013  . Laxity of eyelid 04/24/2013  . Anemia 04/09/2013  . History of peripheral vascular disease 04/09/2013  . History of stroke 04/09/2013  . S/P unilateral BKA (below knee amputation) ([place]) 03/17/2013  . Diabetes mellitus type 2 in obese ([place]) 03/17/2013  . Mixed hyperlipidemia 03/17/2013  . Essential hypertension 03/17/2013  . Gout 03/17/2013  . CKD (chronic  kidney disease), stage III ([place]) 03/17/2013  . OSA on CPAP 03/17/2013  . Anemia, iron deficiency 03/17/2013  . Diverticulosis of colon 03/17/2013  . RBBB 03/17/2013  . Punctal stenosis, acquired 10/02/2012    Immunization History  Administered Date(s) Administered  . Fluad Quad(high Dose 65+) 05/20/2020  . Influenza, High Dose Seasonal PF 03/21/2019  . PFIZER(Purple Top)SARS-COV-2 Vaccination 08/31/2019, 09/25/2019, 04/03/2020  . Pneumococcal Conjugate-13 02/23/2016  . Pneumococcal Polysaccharide-23 05/15/2019  . Tdap 02/04/2013    Conditions to be addressed/monitored:  Hypertension, Hyperlipidemia, Diabetes and Neuropathy, Peripheral Arterial Disease  Care Plan : [place]  Updates made by Mayford Knife, RPH since 09/09/2020 12:00 AM    Problem: HTN, HLD, DM II, GOUT, NEUROPATHY   Priority: High    Long-Range Goal: Disease Management   This Visit's Progress: On track  Priority: High  Note:     Current Barriers:  . Unable to independently monitor therapeutic efficacy . Unable to self administer medications as prescribed . Does not adhere to prescribed medication regimen   Pharmacist Clinical Goal(s):  Marland Kitchen Patient will achieve adherence to monitoring guidelines and medication adherence to achieve therapeutic efficacy . contact provider office for questions/concerns as evidenced notation of same in electronic health record through collaboration with PharmD and provider.    Interventions: . 1:1 collaboration with [name], [name] regarding development and update of comprehensive plan of care as evidenced by provider attestation and co-signature . Inter-disciplinary care team collaboration (see longitudinal  plan of care) . Comprehensive medication review performed; medication list updated in electronic medical record  Hypertension (BP goal <130/80) -Controlled -Current treatment: . Losartan 100 mg tablet once per day  o Patient was told to take 1/2 a  pill at bedtime but per daughter he does not remember why  -Current home readings: picked up BP cuff that was larger.  Checking every morning and it registers.  -Denies hypotensive/hypertensive symptoms -Educated on BP goals and benefits of medications for prevention of heart attack, stroke and kidney damage; Importance of home blood pressure monitoring; -Counseled to monitor BP at home at least once per day, document, and provide log at future appointments -Recommended to continue current medication  Hyperlipidemia: (LDL goal < 70) -Controlled -Current treatment: . Crestor 10 mg taking 1 tablet every 7 days on Wednesday o Patient needs his directions changed in system current number of pills are 114  . Aspirin 81 mg taking 1 tablet by mouth every day   -Educated on Cholesterol goals;  Benefits of statin for ASCVD risk reduction; Importance of limiting foods high in cholesterol; Strategies to manage statin-induced myalgias; -Counseled on diet and exercise extensively Collaborated with PCP team to change medication to Atorvastatin 10 mg tablet on Monday, Wednesday and Friday.   Diabetes (A1c goal <7%) -Controlled -Current medications: . Ozempic 1 mg  - inject once a week on Wednesday  -Current home glucose readings  Patient has a CGM and his BS this morning is 114    At this time he does not allow his daughter access to the  numbers.  -Denies hypoglycemic/hyperglycemic symptoms -Current meal patterns: Will discuss at next visit  . drinks: Glucerna three times per day  -Current exercise: none noted  -Educated on A1c and blood sugar goals; Complications of diabetes including kidney damage, retinal damage, and cardiovascular disease; Prevention and management of hypoglycemic episodes; Benefits of routine self-monitoring of blood sugar; Continuous glucose monitoring; -Counseled to check feet daily and get yearly eye exams -Recommended to continue current medication Counseled on the  importance of CGM.   GOUT (Goal: prevent flare ups) -Controlled -Current treatment  . Allopurinol 100 mg tablet twice per day  . Colcrys 0.6 mg - as needed for gout flare up  -Counseled on diet and exercise extensively Recommended that patient continue to be careful with his diet.   Neuropathy (Goal: to prevent nerve pain) -Controlled -Current treatment  . Gabapentin 100 mg capsule: Two in the morning, 1 at midday and 1 at night.  -Recommended to continue current medication  Query BPH -Controlled -Current treatment  . Tamsulosin 0.4 mg capsule once per day  -Recommended to continue current medication Will collaborate with PCP and other providers to dertemine what medications patient should be currently taking for BPH.     Health Maintenance -Vaccine Therapy:  will discuss with patient getting zoster vaccine during next office visit.  -Current therapy:  . Ferrous Sulfate 325 (65 FE) - take 1 tablet twice per day on Monday, Wednesday and Friday and take one tablet all other days.  . Preservision  . Refresh Ophthalmic  . Colace 100 mg taking one capsule daily  -Educated on Herbal supplement research is limited and benefits usually cannot be proven Cost vs benefit of each product must be carefully weighed by individual consumer Supplements may interfere with prescription drugs -Patient is satisfied with current therapy and denies issues -Recommended to continue current medication   Patient Goals/Self-Care Activities . Patient will:  - take medications as prescribed  focus on medication adherence by using a pill packaging system  check glucose using CGM system, document, and provide at future appointments  Follow Up Plan: Telephone follow up appointment with care management team member scheduled for: The patient has been provided with contact information for the care management team and has been advised to call with any health related questions or concerns.        Medication Assistance: None required.  Patient affirms current coverage meets needs.  Patient's preferred pharmacy is:  [id number] [place] [place] [address] [address]Phone: [phone]: [phone] [place]- [address] [place]Phone: [phone]: [phone] [place]- [address] [place]: [phone]: [phone] Uses pill box? No - per daughter patient has medication scattered around the house.  Pt endorses 80% compliance  We discussed: Verbal consent obtained for [facility] enhanced pharmacy services (medication synchronization, adherence packaging, delivery coordination). A medication sync plan was created to allow patient to get all medications delivered once every 30 to 90 days per patient preference. Patient understands they have freedom to choose pharmacy and clinical pharmacist will coordinate care between all prescribers and [facility]. - Patients daughter and I talked to review patients current medication that is being filled.   Patient decided to: Utilize [facility] for medication synchronization, packaging and delivery  Care Plan and Follow Up Patient Decision:  Patient agrees to Care Plan and Follow-up.  Plan: Telephone follow up appointment with care management team member scheduled for:  09/30/2020 and The patient has been provided with contact information for the care management team and has been advised to call with any health related questions or concerns.   VOrlando Penner PharmD Clinical Pharmacist [facility] [phone]

## 2020-08-27 NOTE — Patient Instructions (Signed)
It appears your toe infection is doing well  Finish 1 more week of antibiotics.   Labs today  See me in 4 weeks with repeat labs (will order when you get here)  Follow up with your podiatrist/wound care as discussed previously

## 2020-08-27 NOTE — Progress Notes (Signed)
[facility] for Infectious Disease  Patient Active Problem List   Diagnosis Date Noted  . 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 Left ankle swelling   . Diabetic foot infection ([place])   . Cerebral thrombosis with cerebral infarction 07/20/2020  . Thrombocytopenia ([place]) 07/19/2020  . Cerebral ischemia 07/18/2020  . Bilateral hearing loss 05/20/2020  . Does use hearing aid 05/20/2020  . Diabetic ulcer of left great toe ([place]) 01/16/2020  . Controlled type 2 diabetes mellitus with stable proliferative retinopathy of both eyes, with long-term current use of insulin ([place]) 01/06/2020  . Intermediate stage nonexudative age-related macular degeneration of both eyes 01/06/2020  . Left epiretinal membrane 01/06/2020  . Drusen of right macula 01/06/2020  . AKI (acute kidney injury) ([place]) 11/02/2019  . Cellulitis 11/02/2019  . Elevated TSH 07/10/2018  . Nephropathy 02/28/2018  . Disturbance of skin sensation 02/28/2018  . CKD (chronic kidney disease) stage 4, GFR 15-29 ml/min (HCC) 07/17/2017  . PAD (peripheral artery disease) ([place]) 07/17/2017  . 110.1 10/21/2013  . Epiphora 04/24/2013  . Laxity of eyelid 04/24/2013  . Anemia 04/09/2013  . History of peripheral vascular disease 04/09/2013  . History of stroke 04/09/2013  . S/P unilateral BKA (below knee amputation) ([place]) 03/17/2013  . Diabetes mellitus type 2 in obese ([place]) 03/17/2013  . Mixed hyperlipidemia 03/17/2013  . Essential hypertension 03/17/2013  . Gout 03/17/2013  . CKD (chronic kidney disease), stage III ([place]) 03/17/2013  . OSA on CPAP 03/17/2013  . Anemia, iron deficiency 03/17/2013  . Diverticulosis of colon 03/17/2013  . RBBB 03/17/2013  . Punctal stenosis, acquired 10/02/2012      Subjective:    Patient ID: Micheal Walls, male    DOB: 06-Apr-1936, 85 y.o.   MRN: [id number]  Chief Complaint  Patient presents with  . Follow-up    HPI:  Micheal Walls is a 85 y.o. male dm2 here for hospitla f/u  and left diabetic foot infection/cellulitis f/u   He was released from hospital on 3/09 Seeing podiatry and wound care No further pain in LLE. Toe ulcer improving. No discharge No f/c/n/v/diarrhea/rash  Previously was on doxy/augmentin for left hallux om, but developed cellulitis and was admitted. abx switched to augmentin/linezolid and resumed since 3/09 on doxy/augmentin. Has 1 more week of abx     Allergies  Allergen Reactions  . Vancomycin Rash      Outpatient Medications Prior to Visit  Medication Sig Dispense Refill  . amoxicillin-clavulanate (AUGMENTIN) 875-125 MG tablet Take 1 tablet by mouth every 12 (twelve) hours for 16 days. 32 tablet 0  . aspirin EC 81 MG tablet Take 81 mg by mouth daily. Swallow whole.    . Biotin 5000 MCG TABS Take 5,000 mcg by mouth daily.    . Continuous Blood Gluc Sensor (FREESTYLE LIBRE 14 DAY SENSOR) MISC Use as directed to check blood sugars 6 each 1  . doxycycline (VIBRA-TABS) 100 MG tablet Take 1 tablet (100 mg total) by mouth every 12 (twelve) hours for 9 days. 18 tablet 0  . ferrous sulfate 325 (65 FE) MG tablet Take 325 mg by mouth every Monday, Wednesday, and Friday.    . fish oil-omega-3 fatty acids 1000 MG capsule Take 2 g by mouth daily.    . furosemide (LASIX) 40 MG tablet Take 2 tabs in morning and 1 tab evening (Patient taking differently: Take 40-80 mg by mouth 2 (two) times daily. 80 mg in the morning and 40  mg at night) 180 tablet 0  . gabapentin (NEURONTIN) 100 MG capsule Take 2 capsules in am 1 capsule mid day and 1 capsule in evening 270 capsule 0  . glucose blood (FREESTYLE PRECISION NEO TEST) test strip Use as instructed 100 each 12  . latanoprost (XALATAN) 0.005 % ophthalmic solution Place 1 drop into both eyes at bedtime.     Marland Kitchen losartan (COZAAR) 100 MG tablet Take 100 mg by mouth daily.    . Multiple Vitamin (MULTIVITAMIN WITH MINERALS) TABS tablet Take 1 tablet by mouth daily.    . pantoprazole (PROTONIX) 40 MG tablet  Take 1 tablet  by mouth daily. (Patient taking differently: Take 40 mg by mouth at bedtime.) 90 tablet 0  . Polyvinyl Alcohol-Povidone (REFRESH OP) Place 1 drop into both eyes daily as needed (dry eyes).    . rosuvastatin (CRESTOR) 10 MG tablet Take 1 tablet (10 mg total) by mouth daily. (Patient taking differently: Take 10 mg by mouth every 7 (seven) days. Wednesday of each week) 90 tablet 1  . Semaglutide, [name], (OZEMPIC, [name],) 2 MG/1.5ML SOPN Inject 1 mg into the skin once a week. (Patient taking differently: Inject 1 mg into the skin every Wednesday.) 6 pen 1  . tamsulosin (FLOMAX) 0.4 MG CAPS capsule Take 0.4 mg by mouth daily.    [name] Apply between toes as needed for maceration. (Patient not taking: No sig reported) 30 mL 3  . colchicine 0.6 MG tablet Take 0.6 mg by mouth daily as needed (gout flare up). (Patient not taking: No sig reported)    . hydrocortisone (ANUSOL-HC) 25 MG suppository Place 25 mg rectally 2 (two) times daily as needed for hemorrhoids or anal itching.    Marland Kitchen PRESCRIPTION MEDICATION Inhale into the lungs See admin instructions. CPAP- At bedtime     No facility-administered medications prior to visit.     Social History   Socioeconomic History  . Marital status: Married    Spouse name: Not on file  . Number of children: Not on file  . Years of education: Not on file  . Highest education level: Not on file  Occupational History  . Occupation: retired  Tobacco Use  . Smoking status: Former Smoker    Types: Cigars    Quit date: 06/12/2001    Years since quitting: 19.2  . Smokeless tobacco: Never Used  Vaping Use  . Vaping Use: Former  Substance and Sexual Activity  . Alcohol use: No    Alcohol/week: 0.0 standard drinks  . Drug use: No  . Sexual activity: Not Currently  Other Topics Concern  . Not on file  Social History Narrative  . Not on file   Social Determinants of Health   Financial Resource Strain: Low  Risk   . Difficulty of Paying Living Expenses: Not hard at all  Food Insecurity: No Food Insecurity  . Worried About Charity fundraiser in the Last Year: Never true  . Ran Out of Food in the Last Year: Never true  Transportation Needs: No Transportation Needs  . Lack of Transportation (Medical): No  . Lack of Transportation (Non-Medical): No  Physical Activity: Inactive  . Days of Exercise per Week: 0 days  . Minutes of Exercise per Session: 0 min  Stress: No Stress Concern Present  . Feeling of Stress : Not at all  Social Connections: Not on file  Intimate Partner Violence: Not on file  Review of Systems    Negative other ros   Objective:    BP 118/63   Pulse 71   Temp 98 F (36.7 C) (Oral)   SpO2 98%  Nursing note and vital signs reviewed.  Physical Exam     No distress cv rrr no mrg Lungs clear; normal respiratory effort abd s/nt Skin no rash; left distal LE no further cellulitic change Ext chronic 1+ brawny edema bilateral LE Msk: left hallux no swelling; 1 dry eschar tip of toe 1 cm, and 1 stage 2 ulcer no purulence/tenderness medial of toe 1 cm Neuro nonfocal   Labs: Lab Results  Component Value Date   WBC 8.8 08/17/2020   HGB 9.6 (L) 08/17/2020   HCT 29.3 (L) 08/17/2020   MCV 93 08/17/2020   PLT 135 (L) 08/17/2020     Chemistry      Component Value Date/Time   NA 137 08/17/2020 1413   K 4.4 08/17/2020 1413   CL 103 08/17/2020 1413   CO2 18 (L) 08/17/2020 1413   BUN 57 (H) 08/17/2020 1413   CREATININE 2.13 (H) 08/17/2020 1413   GLU 167 02/10/2020 0000      Component Value Date/Time   CALCIUM 9.7 08/17/2020 1413   ALKPHOS 80 08/17/2020 1413   AST 42 (H) 08/17/2020 1413   ALT 57 (H) 08/17/2020 1413   BILITOT 0.3 08/17/2020 1413     Crp: 3/03     16 2/28     26  Micro:  Serology:  Imaging:  Assessment & Plan:   Problem List Items Addressed This Visit      Endocrine   Diabetic foot infection ([place]) - Primary   Relevant  Orders   CBC   Comprehensive metabolic panel   C-reactive protein     Musculoskeletal and Integument   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[place])   Relevant Orders   CBC   Comprehensive metabolic panel   C-reactive protein     Other   Cellulitis   Relevant Orders   CBC   Comprehensive metabolic panel   C-reactive protein        Abx: 3/01-c amox/clav 3/09-c doxy  2/27-3/09 linezolid 2/27-3/01 cefepime  Previously doxy/augmentin since 07/22/20   Assessment: 85 yo male pvd, dm2, s/p right bka, chronic left hallux ulcerrecentlyadmitted 2/06 for stroke, with mri left foot showing possible osteomyelitis hallux(no cx/podiatry intervention at that time), placed on abx doxy/augmentin empiric left hallux om, but readmitted for sepsis with cellulitic changes around the left toe despite on abx  #Severe sepsis with aki improved #cellulitis left LE #hx left hallux OM Repeat mri left foot this admission actually suggest improving om changes of the left hallux. Clinically it also doensn't appear the hallux OM is doing worse. the cellulitic region is rather away from the ischemic toe. Surprised it occurs on abx; ?mrsa not amenable to doxy Previously planned 6 weeks doxy/amox-clav from 2/09 2/27 bcx ngtd Clinically improving  #pvd Angiogram done with vascular surgeryon 3/01; s/p angioplasty LLE anterior tibialis    3/17 id f/u Doing very well. No further evidence cellulitis The left hallux still dry eschar (1 cm in size tip of hallux, and a small stage 2 ulcer 1 cm medial of hallux). Seeing wound care/podiatry 1 more week of doxy/amox-clav left  -labs cbc, cmp, crp for abx toxicity and infection progress monitoring -discussed natural history, pathogenesis of diabetic related foot infection -f/u 4 weeks and will repeat testing labs at that time -f/u podiatry/wound care as discussed  with them  I spent more than 20 minute reviewing  data/chart, and coordinating care and >50% direct face to face time providing counseling/discussing diagnostics/treatment plan with patient   Follow-up: Return in about 4 weeks (around 09/24/2020).      Jabier Mutton, [facility] for [facility] [phone]  pager   [phone] cell 08/27/2020, 2:49 PM

## 2020-08-28 ENCOUNTER — Ambulatory Visit: Payer: Medicare Other

## 2020-08-28 DIAGNOSIS — I10 Essential (primary) hypertension: Secondary | ICD-10-CM

## 2020-08-28 DIAGNOSIS — Z794 Long term (current) use of insulin: Secondary | ICD-10-CM

## 2020-08-28 DIAGNOSIS — E1159 Type 2 diabetes mellitus with other circulatory complications: Secondary | ICD-10-CM | POA: Diagnosis not present

## 2020-08-28 DIAGNOSIS — E782 Mixed hyperlipidemia: Secondary | ICD-10-CM

## 2020-08-28 DIAGNOSIS — I739 Peripheral vascular disease, unspecified: Secondary | ICD-10-CM

## 2020-08-28 LAB — CBC
HCT: 29 % — ABNORMAL LOW (ref 38.5–50.0)
Hemoglobin: 9.9 g/dL — ABNORMAL LOW (ref 13.2–17.1)
MCH: 31.1 pg (ref 27.0–33.0)
MCHC: 34.1 g/dL (ref 32.0–36.0)
MCV: 91.2 fL (ref 80.0–100.0)
MPV: 12 fL (ref 7.5–12.5)
Platelets: 224 10*3/uL (ref 140–400)
RBC: 3.18 10*6/uL — ABNORMAL LOW (ref 4.20–5.80)
RDW: 15.8 % — ABNORMAL HIGH (ref 11.0–15.0)
WBC: 5.7 10*3/uL (ref 3.8–10.8)

## 2020-08-28 LAB — COMPREHENSIVE METABOLIC PANEL
AG Ratio: 1.1 (calc) (ref 1.0–2.5)
ALT: 44 U/L (ref 9–46)
AST: 29 U/L (ref 10–35)
Albumin: 3.7 g/dL (ref 3.6–5.1)
Alkaline phosphatase (APISO): 69 U/L (ref 35–144)
BUN/Creatinine Ratio: 35 (calc) — ABNORMAL HIGH (ref 6–22)
BUN: 64 mg/dL — ABNORMAL HIGH (ref 7–25)
CO2: 28 mmol/L (ref 20–32)
Calcium: 10.4 mg/dL — ABNORMAL HIGH (ref 8.6–10.3)
Chloride: 100 mmol/L (ref 98–110)
Creat: 1.85 mg/dL — ABNORMAL HIGH (ref 0.70–1.11)
Globulin: 3.4 g/dL (calc) (ref 1.9–3.7)
Glucose, Bld: 105 mg/dL — ABNORMAL HIGH (ref 65–99)
Potassium: 4.9 mmol/L (ref 3.5–5.3)
Sodium: 136 mmol/L (ref 135–146)
Total Bilirubin: 0.4 mg/dL (ref 0.2–1.2)
Total Protein: 7.1 g/dL (ref 6.1–8.1)

## 2020-08-28 LAB — C-REACTIVE PROTEIN: CRP: 23.4 mg/L — ABNORMAL HIGH (ref <8.0)

## 2020-08-28 NOTE — Chronic Care Management (AMB) (Addendum)
Chronic Care Management    Social Work Note  08/28/2020 Name: Micheal Walls MRN: [id number] DOB: Jul 24, 1935  Micheal Walls is a 85 y.o. year old male who is a primary care patient of [name], [place]. The CCM team was consulted to assist the patient with chronic disease management and/or care coordination needs related to: [employer]  and Level of Care Concerns.   Engaged with patients daughter by phone for follow up visit in response to provider referral for social work chronic care management and care coordination services.   Consent to Services:  The patient was given information about Chronic Care Management services, agreed to services, and gave verbal consent prior to initiation of services.  Please see initial visit note for detailed documentation.   Patient agreed to services and consent obtained.   Assessment: Review of patient past medical history, allergies, medications, and health status, including review of relevant consultants reports was performed today as part of a comprehensive evaluation and provision of chronic care management and care coordination services.     SDOH (Social Determinants of Health) assessments and interventions performed:    Advanced Directives Status: Not addressed in this encounter.  CCM Care Plan  Allergies  Allergen Reactions  . Vancomycin Rash    Outpatient Encounter Medications as of 08/28/2020  Medication Sig Note  . amoxicillin-clavulanate (AUGMENTIN) 875-125 MG tablet Take 1 tablet by mouth every 12 (twelve) hours for 16 days.   Marland Kitchen aspirin EC 81 MG tablet Take 81 mg by mouth daily. Swallow whole.   . Biotin 5000 MCG TABS Take 5,000 mcg by mouth daily.   [name] Apply between toes as needed for maceration. (Patient not taking: No sig reported)   . colchicine 0.6 MG tablet Take 0.6 mg by mouth daily as needed (gout flare up). (Patient not taking: No sig reported)   . Continuous Blood Gluc  Sensor (FREESTYLE LIBRE 14 DAY SENSOR) MISC Use as directed to check blood sugars   . doxycycline (VIBRA-TABS) 100 MG tablet Take 1 tablet (100 mg total) by mouth every 12 (twelve) hours for 9 days.   . ferrous sulfate 325 (65 FE) MG tablet Take 325 mg by mouth every Monday, Wednesday, and Friday.   . fish oil-omega-3 fatty acids 1000 MG capsule Take 2 g by mouth daily.   . furosemide (LASIX) 40 MG tablet Take 2 tabs in morning and 1 tab evening (Patient taking differently: Take 40-80 mg by mouth 2 (two) times daily. 80 mg in the morning and 40 mg at night) 08/09/2020: 80 mg in the morning & 40 at bedtime   . gabapentin (NEURONTIN) 100 MG capsule Take 2 capsules in am 1 capsule mid day and 1 capsule in evening   . glucose blood (FREESTYLE PRECISION NEO TEST) test strip Use as instructed   . hydrocortisone (ANUSOL-HC) 25 MG suppository Place 25 mg rectally 2 (two) times daily as needed for hemorrhoids or anal itching.   . latanoprost (XALATAN) 0.005 % ophthalmic solution Place 1 drop into both eyes at bedtime.    Marland Kitchen losartan (COZAAR) 100 MG tablet Take 100 mg by mouth daily.   . Multiple Vitamin (MULTIVITAMIN WITH MINERALS) TABS tablet Take 1 tablet by mouth daily.   . pantoprazole (PROTONIX) 40 MG tablet Take 1 tablet  by mouth daily. (Patient taking differently: Take 40 mg by mouth at bedtime.)   . Polyvinyl Alcohol-Povidone (REFRESH OP) Place 1 drop into both eyes daily as needed (  dry eyes).   Marland Kitchen PRESCRIPTION MEDICATION Inhale into the lungs See admin instructions. CPAP- At bedtime   . rosuvastatin (CRESTOR) 10 MG tablet Take 1 tablet (10 mg total) by mouth daily. (Patient taking differently: Take 10 mg by mouth every 7 (seven) days. Wednesday of each week)   . Semaglutide, [name], (OZEMPIC, [name],) 2 MG/1.5ML SOPN Inject 1 mg into the skin once a week. (Patient taking differently: Inject 1 mg into the skin every Wednesday.)   . tamsulosin (FLOMAX) 0.4 MG CAPS capsule Take 0.4 mg by mouth  daily.    No facility-administered encounter medications on file as of 08/28/2020.    Patient Active Problem List   Diagnosis Date Noted  . 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 Left ankle swelling   . Diabetic foot infection ([place])   . Cerebral thrombosis with cerebral infarction 07/20/2020  . Thrombocytopenia ([place]) 07/19/2020  . Cerebral ischemia 07/18/2020  . Bilateral hearing loss 05/20/2020  . Does use hearing aid 05/20/2020  . Diabetic ulcer of left great toe ([place]) 01/16/2020  . Controlled type 2 diabetes mellitus with stable proliferative retinopathy of both eyes, with long-term current use of insulin ([place]) 01/06/2020  . Intermediate stage nonexudative age-related macular degeneration of both eyes 01/06/2020  . Left epiretinal membrane 01/06/2020  . Drusen of right macula 01/06/2020  . AKI (acute kidney injury) ([place]) 11/02/2019  . Cellulitis 11/02/2019  . Elevated TSH 07/10/2018  . Nephropathy 02/28/2018  . Disturbance of skin sensation 02/28/2018  . CKD (chronic kidney disease) stage 4, GFR 15-29 ml/min (HCC) 07/17/2017  . PAD (peripheral artery disease) ([place]) 07/17/2017  . 110.1 10/21/2013  . Epiphora 04/24/2013  . Laxity of eyelid 04/24/2013  . Anemia 04/09/2013  . History of peripheral vascular disease 04/09/2013  . History of stroke 04/09/2013  . S/P unilateral BKA (below knee amputation) ([place]) 03/17/2013  . Diabetes mellitus type 2 in obese ([place]) 03/17/2013  . Mixed hyperlipidemia 03/17/2013  . Essential hypertension 03/17/2013  . Gout 03/17/2013  . CKD (chronic kidney disease), stage III ([place]) 03/17/2013  . OSA on CPAP 03/17/2013  . Anemia, iron deficiency 03/17/2013  . Diverticulosis of colon 03/17/2013  . RBBB 03/17/2013  . Punctal stenosis, acquired 10/02/2012    Conditions to be addressed/monitored: DMII and PAD; Limited access to caregiver  Care Plan : Social Work [facility] Plan of Care  Updates made by [name] since 08/28/2020 12:00 AM    Problem:  Mobility and Independence     Long-Range Goal: Mobility and Independence Optimized   Start Date: 08/24/2020  Expected End Date: 12/23/2020  This Visit's Progress: On track  Recent Progress: On track  Priority: High  Note:   Current Barriers:  . Chronic disease management support and education needs related to DM and PAD   . Limited access to caregiver . Unable to independently prepare meals  Social Worker Clinical Goal(s):  Marland Kitchen Over the next 120 days, patient will work with SW to identify and address any acute and/or chronic care coordination needs related to the self health management of DM and PAD   . Over the next 30 days the patient and his daughter will work with SW to identify caregiver resources for in the home . Over the next 10 days the patient will be placed on the wait list for mobile meals Goal Met  CCM SW Interventions: Completed with the [name] . Inter-disciplinary care team collaboration (see longitudinal plan of care) . Collaboration with Laurance Flatten,  Doreene Burke, [place] regarding development and update of comprehensive plan of care as evidenced by provider attestation and co-signature . Collaboration with [name] SW with [facility] to place patient on the waiting list . Successful outbound call placed to the [name]  . [name] the patient has been placed on the wait list which is approximately 1 year - 18 months long . Encouraged Micheal Walls to have her local brother monitor mail as this program often sends a form quarterly to confirm the patient is still in need of the program . Performed chart review to note patients referral to [facility] has been accepted for the [employer] program . [name] a SW with this program will call in the coming months to screen for patient eligibility . Assessed for families plan surrounding care in the home  . Determined the family has a planned zoom call for Sunday  to discuss patient care needs and whether or not the family feels a private duty caregiver is needed . Scheduled follow call over the next 3 weeks to assist with ongoing care coordination needs . Encouraged Micheal Walls to contact SW as needed prior to next call  Patient Goals/Self-Care Activities . Over the next 30 days, patient will: With the help of his children  - Patient will self administer medications as prescribed Patient will attend all scheduled provider appointments Patient will call provider office for new concerns or questions Participate in family discussion regarding patient care needs Contact SW as needed prior to next scheduled call  Follow Up Plan:  SW will follow up with the patient over the next month       Follow Up Plan: SW will follow up over the next month.      [name], [name], [name], Certified Dementia Practitioner [place] [facility] [place] [facility] [phone]  Total time spent performing care coordination and/or care management activities with the patient by phone or face to face = 20 minutes.

## 2020-08-28 NOTE — Patient Instructions (Signed)
Social Worker Visit Information  Goals we discussed today:  Goals Addressed            This Visit's Progress   . Mobility and Independence Optimized       Timeframe:  Long-Range Goal Priority:  High Start Date:  3.14.22                          Expected End Date: 7.13.22                       Next planned outreach: 4.6.22  Patient Goals/Self-Care Activities . Over the next 30 days, patient will: With the help of his children  - Patient will self administer medications as prescribed Patient will attend all scheduled provider appointments Patient will call provider office for new concerns or questions Participate in family discussion regarding patient care needs Contact SW as needed prior to next scheduled call        Materials Provided: Verbal education about resources provided by phone  Follow Up Plan: SW will follow up with patient by phone over the next month.   [name], [name], [name], Certified Dementia Practitioner [place] [facility] [place] [facility] [phone]

## 2020-08-29 DIAGNOSIS — I5030 Unspecified diastolic (congestive) heart failure: Secondary | ICD-10-CM | POA: Diagnosis not present

## 2020-08-29 DIAGNOSIS — I130 Hypertensive heart and chronic kidney disease with heart failure and stage 1 through stage 4 chronic kidney disease, or unspecified chronic kidney disease: Secondary | ICD-10-CM | POA: Diagnosis not present

## 2020-08-29 DIAGNOSIS — E1122 Type 2 diabetes mellitus with diabetic chronic kidney disease: Secondary | ICD-10-CM | POA: Diagnosis not present

## 2020-08-29 DIAGNOSIS — A419 Sepsis, unspecified organism: Secondary | ICD-10-CM | POA: Diagnosis not present

## 2020-08-29 DIAGNOSIS — R531 Weakness: Secondary | ICD-10-CM | POA: Diagnosis not present

## 2020-08-29 DIAGNOSIS — I69398 Other sequelae of cerebral infarction: Secondary | ICD-10-CM | POA: Diagnosis not present

## 2020-08-30 ENCOUNTER — Encounter: Payer: Self-pay | Admitting: Podiatry

## 2020-08-30 DIAGNOSIS — Z7902 Long term (current) use of antithrombotics/antiplatelets: Secondary | ICD-10-CM | POA: Diagnosis not present

## 2020-08-30 DIAGNOSIS — E1151 Type 2 diabetes mellitus with diabetic peripheral angiopathy without gangrene: Secondary | ICD-10-CM | POA: Diagnosis not present

## 2020-08-30 DIAGNOSIS — A419 Sepsis, unspecified organism: Secondary | ICD-10-CM | POA: Diagnosis not present

## 2020-08-30 DIAGNOSIS — E113553 Type 2 diabetes mellitus with stable proliferative diabetic retinopathy, bilateral: Secondary | ICD-10-CM | POA: Diagnosis not present

## 2020-08-30 DIAGNOSIS — Z9181 History of falling: Secondary | ICD-10-CM | POA: Diagnosis not present

## 2020-08-30 DIAGNOSIS — Z79899 Other long term (current) drug therapy: Secondary | ICD-10-CM | POA: Diagnosis not present

## 2020-08-30 DIAGNOSIS — I69398 Other sequelae of cerebral infarction: Secondary | ICD-10-CM | POA: Diagnosis not present

## 2020-08-30 DIAGNOSIS — N184 Chronic kidney disease, stage 4 (severe): Secondary | ICD-10-CM | POA: Diagnosis not present

## 2020-08-30 DIAGNOSIS — I5030 Unspecified diastolic (congestive) heart failure: Secondary | ICD-10-CM | POA: Diagnosis not present

## 2020-08-30 DIAGNOSIS — L03116 Cellulitis of left lower limb: Secondary | ICD-10-CM | POA: Diagnosis not present

## 2020-08-30 DIAGNOSIS — I130 Hypertensive heart and chronic kidney disease with heart failure and stage 1 through stage 4 chronic kidney disease, or unspecified chronic kidney disease: Secondary | ICD-10-CM | POA: Diagnosis not present

## 2020-08-30 DIAGNOSIS — R531 Weakness: Secondary | ICD-10-CM | POA: Diagnosis not present

## 2020-08-30 DIAGNOSIS — E11621 Type 2 diabetes mellitus with foot ulcer: Secondary | ICD-10-CM | POA: Diagnosis not present

## 2020-08-30 DIAGNOSIS — E1122 Type 2 diabetes mellitus with diabetic chronic kidney disease: Secondary | ICD-10-CM | POA: Diagnosis not present

## 2020-08-30 DIAGNOSIS — Z7982 Long term (current) use of aspirin: Secondary | ICD-10-CM | POA: Diagnosis not present

## 2020-08-30 DIAGNOSIS — L97522 Non-pressure chronic ulcer of other part of left foot with fat layer exposed: Secondary | ICD-10-CM | POA: Diagnosis not present

## 2020-08-31 DIAGNOSIS — E11621 Type 2 diabetes mellitus with foot ulcer: Secondary | ICD-10-CM | POA: Diagnosis not present

## 2020-08-31 DIAGNOSIS — L03116 Cellulitis of left lower limb: Secondary | ICD-10-CM | POA: Diagnosis not present

## 2020-08-31 DIAGNOSIS — L97522 Non-pressure chronic ulcer of other part of left foot with fat layer exposed: Secondary | ICD-10-CM | POA: Diagnosis not present

## 2020-08-31 DIAGNOSIS — R531 Weakness: Secondary | ICD-10-CM | POA: Diagnosis not present

## 2020-08-31 DIAGNOSIS — I69398 Other sequelae of cerebral infarction: Secondary | ICD-10-CM | POA: Diagnosis not present

## 2020-08-31 DIAGNOSIS — A419 Sepsis, unspecified organism: Secondary | ICD-10-CM | POA: Diagnosis not present

## 2020-09-01 ENCOUNTER — Encounter: Payer: Self-pay | Admitting: Adult Health

## 2020-09-01 ENCOUNTER — Ambulatory Visit (INDEPENDENT_AMBULATORY_CARE_PROVIDER_SITE_OTHER): Payer: Medicare Other | Admitting: Adult Health

## 2020-09-01 ENCOUNTER — Other Ambulatory Visit: Payer: Self-pay

## 2020-09-01 VITALS — BP 119/52 | HR 78 | Ht 73.0 in

## 2020-09-01 DIAGNOSIS — E11621 Type 2 diabetes mellitus with foot ulcer: Secondary | ICD-10-CM | POA: Diagnosis not present

## 2020-09-01 DIAGNOSIS — L97522 Non-pressure chronic ulcer of other part of left foot with fat layer exposed: Secondary | ICD-10-CM | POA: Diagnosis not present

## 2020-09-01 DIAGNOSIS — I639 Cerebral infarction, unspecified: Secondary | ICD-10-CM | POA: Diagnosis not present

## 2020-09-01 DIAGNOSIS — I70299 Other atherosclerosis of native arteries of extremities, unspecified extremity: Secondary | ICD-10-CM

## 2020-09-01 DIAGNOSIS — L97909 Non-pressure chronic ulcer of unspecified part of unspecified lower leg with unspecified severity: Secondary | ICD-10-CM

## 2020-09-01 DIAGNOSIS — I69398 Other sequelae of cerebral infarction: Secondary | ICD-10-CM | POA: Diagnosis not present

## 2020-09-01 DIAGNOSIS — A419 Sepsis, unspecified organism: Secondary | ICD-10-CM | POA: Diagnosis not present

## 2020-09-01 DIAGNOSIS — R531 Weakness: Secondary | ICD-10-CM | POA: Diagnosis not present

## 2020-09-01 DIAGNOSIS — L03116 Cellulitis of left lower limb: Secondary | ICD-10-CM | POA: Diagnosis not present

## 2020-09-01 NOTE — Progress Notes (Signed)
I agree with the above plan 

## 2020-09-01 NOTE — Patient Instructions (Signed)
Continue aspirin 81 mg daily  and Crestor  for secondary stroke prevention  Ensure follow up with vascular surgery this Friday as scheduled  Continue to follow up with PCP regarding cholesterol, blood pressure and diabetes management  Maintain strict control of hypertension with blood pressure goal below 130/90, diabetes with hemoglobin A1c goal below 6.5% and cholesterol with LDL cholesterol (bad cholesterol) goal below 70 mg/dL.       Followup in the future with me in 6 months or call earlier if needed       Thank you for coming to see [place] at [facility]. I hope we have been able to provide you high quality care today.  You may receive a patient satisfaction survey over the next few weeks. We would appreciate your feedback and comments so that we may continue to improve ourselves and the health of our patients.

## 2020-09-01 NOTE — Progress Notes (Signed)
[facility] [address] [place]. [place] [address] [phone]       HOSPITAL FOLLOW UP NOTE  [name] Date of Birth:  1936/04/26 Medical Record Number:  [id number]   Reason for Referral:  hospital stroke follow up    SUBJECTIVE:   CHIEF COMPLAINT:  Chief Complaint  Patient presents with  . Follow-up    RM 14 alone  Pt is doing well on a stroke standpoint, no complaints     HPI:   [name] is a 85 y.o. male with history of diabetes mellitus with peripheral vascular disease status postRightBKA, diastolic CHF  who presented on 07/18/2020 with generalized weakness, chills, fever, and acute on chronic kidney disease.     Personally reviewed hospitalization pertinent progress notes, lab work and imaging with summary provided.  Evaluated by Dr. Erlinda Hong with stroke work-up revealing incidental right frontal lobe subacute infarct secondary to small vessel disease.  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  MRA possible right VA occlusion or high-grade stenosis.  Carotid Doppler no significant stenosis.  Recommend DAPT for 3 weeks and aspirin alone.  HTN stable.  LDL 43 on Crestor MWF.  Controlled DM A1c 6.1.  Other stroke risk factors include AKI on CKD stage IV, advanced age, former tobacco use, obesity, prior stroke history, OSA on CPAP, diastolic CHF and PVD s/p R BKA.  Other active problems include gout, thrombocytopenia, R BBB and diabetic left great toe infection treated with ceftriaxone and Flagyl.  Evaluated by therapies who recommended [facility] PT/OT  Stroke: incidental right frontal lobe subacute infarct secondary to small vessel disease source  CT head right > left subacute cerebellar infarcts.   MRI subacute punctate infarct of right frontal lobe, vs. T2 shine through per my read (per Dr. Erlinda Hong). Old bilateral cerebellar and pontine infarct.  MRA right vertebral artery not seen possible occlusion or high-grade stenosis.    Carotid Doppler no significant stenosis  2D Echo EF 55-60%, no SOE  LDL 43  HgbA1c 6.1  VTE prophylaxis - Lovenox $RemoveB'40mg'keGbBmrj$  daily  aspirin 81 mg daily prior to admission, now on aspirin 81 mg daily and clopidogrel 75 mg daily. DAPT for 21 days then Aspirin alone  Therapy recommendations: home health OT and PT  Disposition: home  Today, 09/01/2020, Micheal Walls is being seen for hospital follow-up unaccompanied  Stable from stroke standpoint -denies new or residual stroke/TIA symptoms Compliant on aspirin and Crestor -denies side effects Blood pressure today 119/52 - monitors at home which has been stable Glucose levels stable per pt report - this AM BG 120  Since discharge, returned to ED 2/27 for progressive LLE edema with severe sepsis and left great toe osteomyelitis.  S/p abdominal arteriogram with balloon angioplasty LLE anterior tibialis by Dr. Trula Slade 3/1.  Followed by ID for antibiotic regimen.  Advise follow-up with ID and vascular surgery  Has scheduled f/u with [facility] Friday Continues to follow with ID and reports completing antibiotic regimen yesterday Previously nonweightbearing on LLE with restrictions lifted yesterday and has HH PT coming to his home today  At baseline, able to ambulate short distances with rolling walker but otherwise he is primarily nonambulatory which is chronic and uses motorized wheelchair  No concerns at this time     ROS:   14 system review of systems performed and negative with exception of those listed in HPI  PMH:  Past Medical History:  Diagnosis Date  . Acute metabolic encephalopathy 12/14/1636  .  Amputated below knee ([place])    right  . Anemia   . Aspiration pneumonia ([place]) 11/02/2019  . Diastolic heart failure ([place])   . Diverticulosis   . DM (diabetes mellitus) ([place])   . Gout   . Hyperlipemia   . OSA on CPAP   . RBBB   . Sepsis ([place]) 11/02/2019  . Systemic hypertension     PSH:  Past Surgical History:  Procedure Laterality  Date  . ABDOMINAL AORTOGRAM W/LOWER EXTREMITY Left 08/11/2020   Procedure: ABDOMINAL AORTOGRAM W/LOWER EXTREMITY;  Surgeon: Serafina Mitchell, MD;  Location: [facility];  Service: Cardiovascular;  Laterality: Left;  . LEG AMPUTATION BELOW KNEE     right  . NM MYOCAR PERF WALL MOTION  09/18/2009   no ischemia  . PERIPHERAL VASCULAR BALLOON ANGIOPLASTY Left 08/11/2020   Procedure: PERIPHERAL VASCULAR BALLOON ANGIOPLASTY;  Surgeon: Serafina Mitchell, MD;  Location: [facility];  Service: Cardiovascular;  Laterality: Left;  AT    Social History:  Social History   Socioeconomic History  . Marital status: Married    Spouse name: Not on file  . Number of children: Not on file  . Years of education: Not on file  . Highest education level: Not on file  Occupational History  . Occupation: retired  Tobacco Use  . Smoking status: Former Smoker    Types: Cigars    Quit date: 06/12/2001    Years since quitting: 19.2  . Smokeless tobacco: Never Used  Vaping Use  . Vaping Use: Former  Substance and Sexual Activity  . Alcohol use: No    Alcohol/week: 0.0 standard drinks  . Drug use: No  . Sexual activity: Not Currently  Other Topics Concern  . Not on file  Social History Narrative  . Not on file   Social Determinants of Health   Financial Resource Strain: Low Risk   . Difficulty of Paying Living Expenses: Not hard at all  Food Insecurity: No Food Insecurity  . Worried About Charity fundraiser in the Last Year: Never true  . Ran Out of Food in the Last Year: Never true  Transportation Needs: No Transportation Needs  . Lack of Transportation (Medical): No  . Lack of Transportation (Non-Medical): No  Physical Activity: Inactive  . Days of Exercise per Week: 0 days  . Minutes of Exercise per Session: 0 min  Stress: No Stress Concern Present  . Feeling of Stress : Not at all  Social Connections: Not on file  Intimate Partner Violence: Not on file    Family History:   Family History  Problem Relation Age of Onset  . Diabetes Mother   . Heart attack Father   . Cancer Sister     Medications:   Current Outpatient Medications on File Prior to Visit  Medication Sig Dispense Refill  . aspirin EC 81 MG tablet Take 81 mg by mouth daily. Swallow whole.    [name] Apply between toes as needed for maceration. 30 mL 3  . colchicine 0.6 MG tablet Take 0.6 mg by mouth daily as needed (gout flare up).    . Continuous Blood Gluc Sensor (FREESTYLE LIBRE 14 DAY SENSOR) MISC Use as directed to check blood sugars 6 each 1  . ferrous sulfate 325 (65 FE) MG tablet Take 325 mg by mouth every Monday, Wednesday, and Friday.    . fish oil-omega-3 fatty acids 1000 MG capsule Take 2 g by mouth  daily.    . furosemide (LASIX) 40 MG tablet Take 2 tabs in morning and 1 tab evening (Patient taking differently: Take 40-80 mg by mouth 2 (two) times daily. 80 mg in the morning and 40 mg at night) 180 tablet 0  . gabapentin (NEURONTIN) 100 MG capsule Take 2 capsules in am 1 capsule mid day and 1 capsule in evening 270 capsule 0  . glucose blood (FREESTYLE PRECISION NEO TEST) test strip Use as instructed 100 each 12  . latanoprost (XALATAN) 0.005 % ophthalmic solution Place 1 drop into both eyes at bedtime.     Marland Kitchen losartan (COZAAR) 100 MG tablet Take 100 mg by mouth daily.    . Multiple Vitamin (MULTIVITAMIN WITH MINERALS) TABS tablet Take 1 tablet by mouth daily.    . pantoprazole (PROTONIX) 40 MG tablet Take 1 tablet  by mouth daily. (Patient taking differently: Take 40 mg by mouth at bedtime.) 90 tablet 0  . Polyvinyl Alcohol-Povidone (REFRESH OP) Place 1 drop into both eyes daily as needed (dry eyes).    . rosuvastatin (CRESTOR) 10 MG tablet Take 1 tablet (10 mg total) by mouth daily. (Patient taking differently: Take 10 mg by mouth every 7 (seven) days. Wednesday of each week) 90 tablet 1  . Semaglutide, [name], (OZEMPIC, [name],) 2 MG/1.5ML SOPN  Inject 1 mg into the skin once a week. (Patient taking differently: Inject 1 mg into the skin every Wednesday.) 6 pen 1  . tamsulosin (FLOMAX) 0.4 MG CAPS capsule Take 0.4 mg by mouth daily.     No current facility-administered medications on file prior to visit.    Allergies:   Allergies  Allergen Reactions  . Vancomycin Rash      OBJECTIVE:  Physical Exam  Vitals:   09/01/20 1011  BP: (!) 119/52  Pulse: 78  Height: $Remove'6\' 1"'JvFtPRy$  (1.854 m)   Body mass index is 36.94 kg/m. No exam data present  General: well developed, well nourished,  very pleasant elderly African-American male, seated in motorized wheelchair, in no evident distress Head: head normocephalic and atraumatic.   Neck: supple with no carotid or supraclavicular bruits Cardiovascular: regular rate and rhythm, no murmurs Musculoskeletal: R BKA with prosthetic in place Skin:  left foot in walking shoe and wrapped in gauze  Vascular:  Normal pulses all extremities   Neurologic Exam Mental Status: Awake and fully alert.   Fluent speech and language.  Oriented to place and time. Recent and remote memory intact. Attention span, concentration and fund of knowledge appropriate. Mood and affect appropriate.  Cranial Nerves: Fundoscopic exam reveals sharp disc margins. Pupils equal, briskly reactive to light. Extraocular movements full without nystagmus. Visual fields full to confrontation. Hearing intact. Facial sensation intact. Face, tongue, palate moves normally and symmetrically.  Motor: Normal bulk and tone. Normal strength in all tested extremity muscles (R BKA) Sensory.: intact to touch , pinprick , position and vibratory sensation.  Coordination: Rapid alternating movements normal in all extremities. Finger-to-nose performed accurately bilaterally  Gait and Station: Deferred Reflexes: 1+ and symmetric. Toes downgoing.     NIHSS  0 Modified Rankin  0      ASSESSMENT: Micheal Walls is a 85 y.o. year old male  presented to [place] [facility] ED with generalized weakness, chills, fever and acute on chronic kidney disease on 07/18/2020 like in setting of sepsis secondary to diabetic left foot infection.  Transferred to [facility] ED for evidence of incidental right frontal lobe subacute infarct secondary to small vessel  disease. Vascular risk factors include HTN, HLD, DM, PVD s/p R BKA, diastolic CHF, R VA stenosis/occlusion, AKI on CKD stage IV, prior stroke history (b/l cerebellar and pontine) and OSA on CPAP.      PLAN:  1. R frontal lobe stroke, prior stroke hx:  a. Chronic gait impairment from prior strokes.  Recent frontal stroke likely incidental finding as no correlated symptoms.   b. Continue aspirin 81 mg daily  and Crestor 10 mg MWF for secondary stroke prevention.   c. Discussed secondary stroke prevention measures and importance of close PCP follow up for aggressive stroke risk factor management  2. HTN: BP goal <130/90.  Stable on current regimen per PCP 3. HLD: LDL goal <70. Recent LDL 43 on Crestor MWF per PCP.  4. DMII: A1c goal<7.0. Recent A1c 6.1. on Ozempic per PCP 5. Osteomyelitis, left great toe: s/p abdominal aortogram with balloon angioplasty LLE anterior tibialis 08/11/2020.  Follows routinely with ID and [facility]    Follow up in 6 months or call earlier if needed   CC:  [facility] provider: Dr. Leonie Man PCP: [name], [name]    I spent 45 minutes of face-to-face and non-face-to-face time with patient.  This included previsit chart review including recent hospitalization pertinent progress notes, lab work and imaging, lab review, study review, order entry, electronic health record documentation, patient education regarding recent stroke and history of prior strokes with residual deficits, importance of managing stroke risk factors, short discussion of recent admission for osteomyelitis and sepsis and follow-up with ID and VVS and answered all other questions to patient satisfaction  Frann Rider,  AGNP-BC  [facility] [address] [place], [place] [address]  Phone [phone] Fax [phone] Note: This document was prepared with digital dictation and possible smart phrase technology. Any transcriptional errors that result from this process are unintentional.

## 2020-09-02 DIAGNOSIS — L03116 Cellulitis of left lower limb: Secondary | ICD-10-CM | POA: Diagnosis not present

## 2020-09-02 DIAGNOSIS — E11621 Type 2 diabetes mellitus with foot ulcer: Secondary | ICD-10-CM | POA: Diagnosis not present

## 2020-09-02 DIAGNOSIS — I69398 Other sequelae of cerebral infarction: Secondary | ICD-10-CM | POA: Diagnosis not present

## 2020-09-02 DIAGNOSIS — A419 Sepsis, unspecified organism: Secondary | ICD-10-CM | POA: Diagnosis not present

## 2020-09-02 DIAGNOSIS — R531 Weakness: Secondary | ICD-10-CM | POA: Diagnosis not present

## 2020-09-02 DIAGNOSIS — L97522 Non-pressure chronic ulcer of other part of left foot with fat layer exposed: Secondary | ICD-10-CM | POA: Diagnosis not present

## 2020-09-03 DIAGNOSIS — E11621 Type 2 diabetes mellitus with foot ulcer: Secondary | ICD-10-CM | POA: Diagnosis not present

## 2020-09-03 DIAGNOSIS — R531 Weakness: Secondary | ICD-10-CM | POA: Diagnosis not present

## 2020-09-03 DIAGNOSIS — L03116 Cellulitis of left lower limb: Secondary | ICD-10-CM | POA: Diagnosis not present

## 2020-09-03 DIAGNOSIS — I69398 Other sequelae of cerebral infarction: Secondary | ICD-10-CM | POA: Diagnosis not present

## 2020-09-03 DIAGNOSIS — L97522 Non-pressure chronic ulcer of other part of left foot with fat layer exposed: Secondary | ICD-10-CM | POA: Diagnosis not present

## 2020-09-03 DIAGNOSIS — A419 Sepsis, unspecified organism: Secondary | ICD-10-CM | POA: Diagnosis not present

## 2020-09-04 ENCOUNTER — Ambulatory Visit (INDEPENDENT_AMBULATORY_CARE_PROVIDER_SITE_OTHER): Payer: Medicare Other | Admitting: Physician Assistant

## 2020-09-04 ENCOUNTER — Ambulatory Visit (INDEPENDENT_AMBULATORY_CARE_PROVIDER_SITE_OTHER)
Admit: 2020-09-04 | Discharge: 2020-09-04 | Disposition: A | Discharge status: Home / Self Care | Payer: Medicare Other | Attending: Vascular Surgery | Admitting: Vascular Surgery

## 2020-09-04 ENCOUNTER — Ambulatory Visit (HOSPITAL_COMMUNITY)
Admission: RE | Admit: 2020-09-04 | Discharge: 2020-09-04 | Disposition: A | Discharge status: Home / Self Care | Payer: Medicare Other | Source: Ambulatory Visit | Attending: Vascular Surgery | Admitting: Vascular Surgery

## 2020-09-04 ENCOUNTER — Other Ambulatory Visit: Payer: Self-pay

## 2020-09-04 ENCOUNTER — Telehealth: Payer: Self-pay

## 2020-09-04 VITALS — BP 115/80 | HR 81 | Temp 97.8°F | Resp 20 | Ht 73.0 in | Wt 253.6 lb

## 2020-09-04 DIAGNOSIS — I70299 Other atherosclerosis of native arteries of extremities, unspecified extremity: Secondary | ICD-10-CM | POA: Diagnosis not present

## 2020-09-04 DIAGNOSIS — A419 Sepsis, unspecified organism: Secondary | ICD-10-CM | POA: Diagnosis not present

## 2020-09-04 DIAGNOSIS — L97909 Non-pressure chronic ulcer of unspecified part of unspecified lower leg with unspecified severity: Secondary | ICD-10-CM

## 2020-09-04 DIAGNOSIS — L03116 Cellulitis of left lower limb: Secondary | ICD-10-CM | POA: Diagnosis not present

## 2020-09-04 DIAGNOSIS — I69398 Other sequelae of cerebral infarction: Secondary | ICD-10-CM | POA: Diagnosis not present

## 2020-09-04 DIAGNOSIS — E11621 Type 2 diabetes mellitus with foot ulcer: Secondary | ICD-10-CM | POA: Diagnosis not present

## 2020-09-04 DIAGNOSIS — R531 Weakness: Secondary | ICD-10-CM | POA: Diagnosis not present

## 2020-09-04 DIAGNOSIS — I739 Peripheral vascular disease, unspecified: Secondary | ICD-10-CM | POA: Diagnosis not present

## 2020-09-04 DIAGNOSIS — I6509 Occlusion and stenosis of unspecified vertebral artery: Secondary | ICD-10-CM | POA: Diagnosis not present

## 2020-09-04 DIAGNOSIS — L97522 Non-pressure chronic ulcer of other part of left foot with fat layer exposed: Secondary | ICD-10-CM | POA: Diagnosis not present

## 2020-09-04 MED ORDER — CEPHALEXIN 500 MG PO CAPS: 500.0000 mg | ORAL_CAPSULE | Freq: Two times a day (BID) | ORAL | 0 refills | Status: DC

## 2020-09-04 NOTE — Progress Notes (Signed)
09/04/20- Called and spoke with the patient's daughter to ask if she could call the patient Medicare insurance and speak with a customer service representative to identify if [facility] is a preferred [place] under insurance plan. Provided patient's daughter the address and telephone number for [facility]. The patient's wife voiced understanding and stated she will contact the patient's insurance company.  09/08/20-Called and left a message with Jeralene Peters from [facility] and Benefits plan to speak of the patient's prescription benefits.  2:25 PM-Called and spoke with [employer] who supports the administration for [place] and Benefits plan, [facility] is In-Network, patient should have the same co-pays as [facility] per representative.  [name], CPP Notified.  Raynelle Highland, [place] Pharmacist Assistant [phone] CCM Total Time: 33 minutes

## 2020-09-04 NOTE — Progress Notes (Signed)
HISTORY AND PHYSICAL     CC:  follow up. Requesting Provider:  [name], [name]  HPI: This is a 85 y.o. male who is here today for follow up for PAD.  He had presented to the hospital at the end of February lethargic and confused and left leg pain.  He was recently diagnosed with left big toe osteomyelitis and has been taking po abx including doxycycline and augmentin since 07/22/2020 and has been followed by wound care.  He was also having some left leg swelling and venous duplex was obtained, which revealed no evidence of DVT.    Pt has hx of HTN, CKD III, chronic diastolic CHF, DM, recent stroke, neuropathy.  He has a hx of GIB in 2012.  Pt was seen by Dr. Scot Dock in 2020 for left foot wound.  In April 2020, he had a healing venous ulcer of the left leg with underlying infrainguinal arterial occlusive disease.  He did have a toe pressure of 50mmHg and it was felt he had adequate circulation to heal the wound.  He would consider arteriogram with CO2 if the wound did not heal but fortunately, it did heal.  He was found to have CEAP C5 venous disease with moderate LLE swelling.  He discussed with him the importance of intermittent leg elevation and proper positioning.  He was given rx for 15--20 knee high compression socks.    He has hx of right BKA.  He states this was done in the 2000's and he did have a wound at that time.  left toe osteomyelitis and PAD.  Pt states he has the wound on the left great toe that has been present for about 15 weeks.  He was wearing his compression socks and elevating his legs.  He was not having any pain in the foot with elevation.  He was walking with prosthesis in the other leg as well as using wheelchair.    On 08/11/2020, he underwent angiogram on 08/11/2020 and had angioplasty of the left ATA by Dr. Trula Slade.  The next morning the pt was doing well with excellent result by bedside doppler.  He was scheduled to follow up in 3-4 weeks with studies and he is here  today for that visit.    He states he has not had any fevers.  Says the wound on his foot has drained a little bit.   He is not having pain in his foot.  He continues to wear his compression when he is not going to appts.   The pt is on a statin for cholesterol management.    The pt is on an aspirin.    Other AC:  none The pt is on ARB for hypertension.  The pt does have diabetes. Tobacco hx:  former    Past Medical History:  Diagnosis Date  . Acute metabolic encephalopathy 02/13/8100  . Amputated below knee ([place])    right  . Anemia   . Aspiration pneumonia ([place]) 11/02/2019  . Diastolic heart failure ([place])   . Diverticulosis   . DM (diabetes mellitus) ([place])   . Gout   . Hyperlipemia   . OSA on CPAP   . RBBB   . Sepsis ([place]) 11/02/2019  . Systemic hypertension     Past Surgical History:  Procedure Laterality Date  . ABDOMINAL AORTOGRAM W/LOWER EXTREMITY Left 08/11/2020   Procedure: ABDOMINAL AORTOGRAM W/LOWER EXTREMITY;  Surgeon: Serafina Mitchell, MD;  Location: [facility];  Service: Cardiovascular;  Laterality:  Left;  . LEG AMPUTATION BELOW KNEE     right  . NM MYOCAR PERF WALL MOTION  09/18/2009   no ischemia  . PERIPHERAL VASCULAR BALLOON ANGIOPLASTY Left 08/11/2020   Procedure: PERIPHERAL VASCULAR BALLOON ANGIOPLASTY;  Surgeon: Serafina Mitchell, MD;  Location: [facility];  Service: Cardiovascular;  Laterality: Left;  AT    Allergies  Allergen Reactions  . Vancomycin Rash    Current Outpatient Medications  Medication Sig Dispense Refill  . aspirin EC 81 MG tablet Take 81 mg by mouth daily. Swallow whole.    [name] Apply between toes as needed for maceration. 30 mL 3  . colchicine 0.6 MG tablet Take 0.6 mg by mouth daily as needed (gout flare up).    . Continuous Blood Gluc Sensor (FREESTYLE LIBRE 14 DAY SENSOR) MISC Use as directed to check blood sugars 6 each 1  . ferrous sulfate 325 (65 FE) MG tablet Take 325 mg by mouth  every Monday, Wednesday, and Friday.    . fish oil-omega-3 fatty acids 1000 MG capsule Take 2 g by mouth daily.    . furosemide (LASIX) 40 MG tablet Take 2 tabs in morning and 1 tab evening (Patient taking differently: Take 40-80 mg by mouth 2 (two) times daily. 80 mg in the morning and 40 mg at night) 180 tablet 0  . gabapentin (NEURONTIN) 100 MG capsule Take 2 capsules in am 1 capsule mid day and 1 capsule in evening 270 capsule 0  . glucose blood (FREESTYLE PRECISION NEO TEST) test strip Use as instructed 100 each 12  . latanoprost (XALATAN) 0.005 % ophthalmic solution Place 1 drop into both eyes at bedtime.     Marland Kitchen losartan (COZAAR) 100 MG tablet Take 100 mg by mouth daily.    . Multiple Vitamin (MULTIVITAMIN WITH MINERALS) TABS tablet Take 1 tablet by mouth daily.    . pantoprazole (PROTONIX) 40 MG tablet Take 1 tablet  by mouth daily. (Patient taking differently: Take 40 mg by mouth at bedtime.) 90 tablet 0  . Polyvinyl Alcohol-Povidone (REFRESH OP) Place 1 drop into both eyes daily as needed (dry eyes).    . rosuvastatin (CRESTOR) 10 MG tablet Take 1 tablet (10 mg total) by mouth daily. (Patient taking differently: Take 10 mg by mouth every 7 (seven) days. Wednesday of each week) 90 tablet 1  . Semaglutide, [name], (OZEMPIC, [name],) 2 MG/1.5ML SOPN Inject 1 mg into the skin once a week. (Patient taking differently: Inject 1 mg into the skin every Wednesday.) 6 pen 1  . tamsulosin (FLOMAX) 0.4 MG CAPS capsule Take 0.4 mg by mouth daily.     No current facility-administered medications for this visit.    Family History  Problem Relation Age of Onset  . Diabetes Mother   . Heart attack Father   . Cancer Sister     Social History   Socioeconomic History  . Marital status: Married    Spouse name: Not on file  . Number of children: Not on file  . Years of education: Not on file  . Highest education level: Not on file  Occupational History  . Occupation: retired  Tobacco Use   . Smoking status: Former Smoker    Types: Cigars    Quit date: 06/12/2001    Years since quitting: 19.2  . Smokeless tobacco: Never Used  Vaping Use  . Vaping Use: Former  Substance and Sexual Activity  . Alcohol use:  No    Alcohol/week: 0.0 standard drinks  . Drug use: No  . Sexual activity: Not Currently  Other Topics Concern  . Not on file  Social History Narrative  . Not on file   Social Determinants of Health   Financial Resource Strain: Low Risk   . Difficulty of Paying Living Expenses: Not hard at all  Food Insecurity: No Food Insecurity  . Worried About Charity fundraiser in the Last Year: Never true  . Ran Out of Food in the Last Year: Never true  Transportation Needs: No Transportation Needs  . Lack of Transportation (Medical): No  . Lack of Transportation (Non-Medical): No  Physical Activity: Inactive  . Days of Exercise per Week: 0 days  . Minutes of Exercise per Session: 0 min  Stress: No Stress Concern Present  . Feeling of Stress : Not at all  Social Connections: Not on file  Intimate Partner Violence: Not on file     REVIEW OF SYSTEMS:   '[X]'$  denotes positive finding, $RemoveBeforeDEI'[ ]'okXHMutzDmhXYDrJ$  denotes negative finding Cardiac  Comments:  Chest pain or chest pressure:    Shortness of breath upon exertion:    Short of breath when lying flat:    Irregular heart rhythm:        Vascular    Pain in calf, thigh, or hip brought on by ambulation:    Pain in feet at night that wakes you up from your sleep:     Blood clot in your veins:    Leg swelling:  x       Pulmonary    Oxygen at home:    Productive cough:     Wheezing:         Neurologic    Sudden weakness in arms or legs:     Sudden numbness in arms or legs:     Sudden onset of difficulty speaking or slurred speech:    Temporary loss of vision in one eye:     Problems with dizziness:         Gastrointestinal    Blood in stool:     Vomited blood:         Genitourinary    Burning when urinating:     Blood  in urine:        Psychiatric    Major depression:         Hematologic    Bleeding problems:    Problems with blood clotting too easily:        Skin    Rashes or ulcers: x       Constitutional    Fever or chills:      PHYSICAL EXAMINATION:  Today's Vitals   09/04/20 1133  BP: 115/80  Pulse: 81  Resp: 20  Temp: 97.8 F (36.6 C)  TempSrc: Oral  SpO2: 98%  Weight: 253 lb 9.6 oz (115 kg)  Height: $Remove'6\' 1"'QjHzsmM$  (1.854 m)  PainSc: 5   PainLoc: Leg   Body mass index is 33.46 kg/m.   General:  WDWN in NAD; vital signs documented above Gait: Not observed-in wheelchair HENT: WNL, normocephalic Pulmonary: normal non-labored breathing , without wheezing Cardiac: regular HR, without  Murmur; without carotid bruits Skin: without rashes Vascular Exam/Pulses:  Right Left  DP BKA monophasic  PT BKA monophasic  Peroneal BKA monophasic   Extremities: +LLE swelling; non healing left great toe ulcer with odor      Musculoskeletal: no muscle wasting or atrophy  Neurologic: A&O X  3;  No focal weakness or paresthesias are detected Psychiatric:  The pt has Normal affect.   Non-Invasive Vascular Imaging:   ABI's/TBI's on 09/04/2020: Right:  BKA  Left:  [place]/bandage - Great toe pressure: not done   LLE Arterial duplex on 09/04/2020: +-----------+--------+-----+--------+----------+--------+  LEFT    PSV cm/sRatioStenosisWaveform Comments  +-----------+--------+-----+--------+----------+--------+  CFA Prox  132          triphasic plaque   +-----------+--------+-----+--------+----------+--------+  CFA Distal 175          triphasic       +-----------+--------+-----+--------+----------+--------+  DFA    102          biphasic       +-----------+--------+-----+--------+----------+--------+  SFA Prox  149          triphasic broad    +-----------+--------+-----+--------+----------+--------+  SFA Mid   85          triphasic dampened  +-----------+--------+-----+--------+----------+--------+  SFA Distal 112          biphasic broad    +-----------+--------+-----+--------+----------+--------+  POP Prox  77          biphasic dampened  +-----------+--------+-----+--------+----------+--------+  POP Mid  114          monophasicbeisk    +-----------+--------+-----+--------+----------+--------+  POP Distal 109          monophasicbrisk    +-----------+--------+-----+--------+----------+--------+  ATA Distal 25          monophasicdampened  +-----------+--------+-----+--------+----------+--------+  PTA Distal 49          monophasic      +-----------+--------+-----+--------+----------+--------+  PERO Distal                NV     +-----------+--------+-----+--------+----------+--------+   Previous ABI's/TBI's on 07/19/2020: Right:  [place] Left:  [place]  Previous carotid duplex on 07/19/2020: 1-39% bilateral ICA stenosis   ASSESSMENT/PLAN:: 85 y.o. male here for follow up for PAD s/p angiogram of LLE with angioplasty of left ATA 08/11/2020 by Dr. Arita Miss for non healing wound with osteomyelitis great toe.  -pt s/p angioplasty of LLE for left great toe wound.  Compared to pictures taken on 3/9 by podiatry, wound has worsened.  There is a mild odor to the wound with mild drainage.  Discussed with Dr. Donzetta Matters who reviewed his previous angiogram and today's study and felt that he needed a left great toe amputation.  Discussed with pt that we could schedule or he could have Dr. Sherryle Lis do this.  I discussed with him there is a chance this would not heal and may require more proximal amputation.  Also sent Rx to his pharmacy for Keflex $RemoveBe'500mg'CnzyLNBTL$  bid #20 no  Refill.  He would like to discuss with his daughter and will call to schedule.    Leontine Locket, [facility] [phone]  Clinic MD:   Donzetta Matters

## 2020-09-07 ENCOUNTER — Encounter (HOSPITAL_BASED_OUTPATIENT_CLINIC_OR_DEPARTMENT_OTHER): Payer: Medicare Other | Admitting: Internal Medicine

## 2020-09-07 ENCOUNTER — Ambulatory Visit (INDEPENDENT_AMBULATORY_CARE_PROVIDER_SITE_OTHER): Payer: Medicare Other | Admitting: Ophthalmology

## 2020-09-07 ENCOUNTER — Encounter (INDEPENDENT_AMBULATORY_CARE_PROVIDER_SITE_OTHER): Payer: Self-pay | Admitting: Ophthalmology

## 2020-09-07 ENCOUNTER — Other Ambulatory Visit: Payer: Self-pay

## 2020-09-07 ENCOUNTER — Encounter (INDEPENDENT_AMBULATORY_CARE_PROVIDER_SITE_OTHER): Payer: Medicare Other | Admitting: Ophthalmology

## 2020-09-07 DIAGNOSIS — L97528 Non-pressure chronic ulcer of other part of left foot with other specified severity: Secondary | ICD-10-CM | POA: Diagnosis not present

## 2020-09-07 DIAGNOSIS — L97522 Non-pressure chronic ulcer of other part of left foot with fat layer exposed: Secondary | ICD-10-CM | POA: Diagnosis not present

## 2020-09-07 DIAGNOSIS — Z794 Long term (current) use of insulin: Secondary | ICD-10-CM

## 2020-09-07 DIAGNOSIS — E113553 Type 2 diabetes mellitus with stable proliferative diabetic retinopathy, bilateral: Secondary | ICD-10-CM

## 2020-09-07 DIAGNOSIS — L03116 Cellulitis of left lower limb: Secondary | ICD-10-CM | POA: Diagnosis not present

## 2020-09-07 DIAGNOSIS — I69398 Other sequelae of cerebral infarction: Secondary | ICD-10-CM | POA: Diagnosis not present

## 2020-09-07 DIAGNOSIS — N184 Chronic kidney disease, stage 4 (severe): Secondary | ICD-10-CM | POA: Diagnosis not present

## 2020-09-07 DIAGNOSIS — E113593 Type 2 diabetes mellitus with proliferative diabetic retinopathy without macular edema, bilateral: Secondary | ICD-10-CM

## 2020-09-07 DIAGNOSIS — M86672 Other chronic osteomyelitis, left ankle and foot: Secondary | ICD-10-CM | POA: Diagnosis not present

## 2020-09-07 DIAGNOSIS — E11621 Type 2 diabetes mellitus with foot ulcer: Secondary | ICD-10-CM | POA: Diagnosis not present

## 2020-09-07 DIAGNOSIS — R531 Weakness: Secondary | ICD-10-CM | POA: Diagnosis not present

## 2020-09-07 DIAGNOSIS — A419 Sepsis, unspecified organism: Secondary | ICD-10-CM | POA: Diagnosis not present

## 2020-09-07 DIAGNOSIS — E1169 Type 2 diabetes mellitus with other specified complication: Secondary | ICD-10-CM | POA: Diagnosis not present

## 2020-09-07 DIAGNOSIS — E1122 Type 2 diabetes mellitus with diabetic chronic kidney disease: Secondary | ICD-10-CM | POA: Diagnosis not present

## 2020-09-07 NOTE — Progress Notes (Signed)
09/07/2020     CHIEF COMPLAINT Patient presents for Retina Follow Up (8 Month Diabetic f\u OU. OCT and FP/Pt states vision is about the same. Denies new floaters and FOL./BGL: 136)   HISTORY OF PRESENT ILLNESS: Micheal Walls is a 85 y.o. male who presents to the clinic today for:   HPI    Retina Follow Up    Patient presents with  Diabetic Retinopathy.  In both eyes.  Severity is moderate.  Duration of 8 months.  Since onset it is stable.  I, the attending physician,  performed the HPI with the patient and updated documentation appropriately. Additional comments: 8 Month Diabetic f\u OU. OCT and FP Pt states vision is about the same. Denies new floaters and FOL. BGL: 136       Last edited by Tilda Franco on 09/07/2020  3:38 PM. (History)      Referring physician: [name], [address] [place],  [place] [address]  HISTORICAL INFORMATION:   Selected notes from the [facility]    Lab Results  Component Value Date   HGBA1C 6.1 (H) 07/19/2020     CURRENT MEDICATIONS: Current Outpatient Medications (Ophthalmic Drugs)  Medication Sig  . latanoprost (XALATAN) 0.005 % ophthalmic solution Place 1 drop into both eyes at bedtime.   . Polyvinyl Alcohol-Povidone (REFRESH OP) Place 1 drop into both eyes daily as needed (dry eyes).   No current facility-administered medications for this visit. (Ophthalmic Drugs)   Current Outpatient Medications (Other)  Medication Sig  . aspirin EC 81 MG tablet Take 81 mg by mouth daily. Swallow whole.  [name] Apply between toes as needed for maceration.  . cephALEXin (KEFLEX) 500 MG capsule Take 1 capsule (500 mg total) by mouth 2 (two) times daily.  . colchicine 0.6 MG tablet Take 0.6 mg by mouth daily as needed (gout flare up).  . Continuous Blood Gluc Sensor (FREESTYLE LIBRE 14 DAY SENSOR) MISC Use as directed to check blood sugars  . ferrous sulfate 325 (65 FE) MG tablet  Take 325 mg by mouth every Monday, Wednesday, and Friday.  . fish oil-omega-3 fatty acids 1000 MG capsule Take 2 g by mouth daily.  . furosemide (LASIX) 40 MG tablet Take 2 tabs in morning and 1 tab evening (Patient taking differently: Take 40-80 mg by mouth 2 (two) times daily. 80 mg in the morning and 40 mg at night)  . gabapentin (NEURONTIN) 100 MG capsule Take 2 capsules in am 1 capsule mid day and 1 capsule in evening  . glucose blood (FREESTYLE PRECISION NEO TEST) test strip Use as instructed  . losartan (COZAAR) 100 MG tablet Take 100 mg by mouth daily.  . Multiple Vitamin (MULTIVITAMIN WITH MINERALS) TABS tablet Take 1 tablet by mouth daily.  . pantoprazole (PROTONIX) 40 MG tablet Take 1 tablet  by mouth daily. (Patient taking differently: Take 40 mg by mouth at bedtime.)  . rosuvastatin (CRESTOR) 10 MG tablet Take 1 tablet (10 mg total) by mouth daily. (Patient taking differently: Take 10 mg by mouth every 7 (seven) days. Wednesday of each week)  . Semaglutide, [name], (OZEMPIC, [name],) 2 MG/1.5ML SOPN Inject 1 mg into the skin once a week. (Patient taking differently: Inject 1 mg into the skin every Wednesday.)  . tamsulosin (FLOMAX) 0.4 MG CAPS capsule Take 0.4 mg by mouth daily.   No current facility-administered medications for this visit. (Other)  REVIEW OF SYSTEMS: ROS    Positive for: Endocrine   Last edited by Tilda Franco on 09/07/2020  3:38 PM. (History)       ALLERGIES Allergies  Allergen Reactions  . Vancomycin Rash    PAST MEDICAL HISTORY Past Medical History:  Diagnosis Date  . Acute metabolic encephalopathy 02/13/7901  . Amputated below knee ([place])    right  . Anemia   . Aspiration pneumonia ([place]) 11/02/2019  . Diastolic heart failure ([place])   . Diverticulosis   . DM (diabetes mellitus) ([place])   . Gout   . Hyperlipemia   . OSA on CPAP   . RBBB   . Sepsis ([place]) 11/02/2019  . Systemic hypertension    Past Surgical History:  Procedure  Laterality Date  . ABDOMINAL AORTOGRAM W/LOWER EXTREMITY Left 08/11/2020   Procedure: ABDOMINAL AORTOGRAM W/LOWER EXTREMITY;  Surgeon: Serafina Mitchell, MD;  Location: [facility];  Service: Cardiovascular;  Laterality: Left;  . LEG AMPUTATION BELOW KNEE     right  . NM MYOCAR PERF WALL MOTION  09/18/2009   no ischemia  . PERIPHERAL VASCULAR BALLOON ANGIOPLASTY Left 08/11/2020   Procedure: PERIPHERAL VASCULAR BALLOON ANGIOPLASTY;  Surgeon: Serafina Mitchell, MD;  Location: [facility];  Service: Cardiovascular;  Laterality: Left;  AT    FAMILY HISTORY Family History  Problem Relation Age of Onset  . Diabetes Mother   . Heart attack Father   . Cancer Sister     SOCIAL HISTORY Social History   Tobacco Use  . Smoking status: Former Smoker    Types: Cigars    Quit date: 06/12/2001    Years since quitting: 19.2  . Smokeless tobacco: Never Used  Vaping Use  . Vaping Use: Former  Substance Use Topics  . Alcohol use: No    Alcohol/week: 0.0 standard drinks  . Drug use: No         OPHTHALMIC EXAM:  Base Eye Exam    Visual Acuity (Snellen - Linear)      Right Left   Dist cc 20/60 20/50 -1   Dist ph cc 20/50 -2 20/30 -2   Correction: Glasses       Tonometry (Tonopen, 3:43 PM)      Right Left   Pressure 12 11       Pupils      Pupils Dark Light Shape React APD   Right PERRL 3 3 Round Minimal None   Left PERRL 3 3 Round Minimal None       Visual Fields (Counting fingers)      Left Right    Full Full       Neuro/Psych    Oriented x3: Yes   Mood/Affect: Normal       Dilation    Both eyes: 1.0% Mydriacyl, 2.5% Phenylephrine @ 3:43 PM        Slit Lamp and Fundus Exam    External Exam      Right Left   External Normal Normal       Slit Lamp Exam      Right Left   Lids/Lashes Normal Normal   Conjunctiva/Sclera White and quiet White and quiet   Cornea Clear Clear   Anterior Chamber Deep and quiet Deep and quiet   Iris Round and reactive Round  and reactive   Lens Centered posterior chamber intraocular lens Centered posterior chamber intraocular lens   Anterior Vitreous Normal Normal       Fundus Exam  Right Left   Posterior Vitreous Normal Normal   Disc Normal Normal   C/D Ratio 0.3 0.3   Macula Normal, good focal laser, no active CSME Normal, good focal laser, no active CSME   Vessels PDR-quiet PDR-quiet   Periphery Good PRP 360, no active disease Good PRP 360, no active disease          IMAGING AND PROCEDURES  Imaging and Procedures for 09/07/20  OCT, Retina - OU - Both Eyes       Right Eye Quality was borderline. Scan locations included subfoveal. Progression has been stable. Findings include abnormal foveal contour, central retinal atrophy, outer retinal atrophy.   Left Eye Quality was good. Scan locations included subfoveal. Central Foveal Thickness: 206. Progression has been stable. Findings include abnormal foveal contour, central retinal atrophy, outer retinal atrophy.   Notes No active CSME OU       Color Fundus Photography Optos - OU - Both Eyes       Right Eye Progression has been stable. Disc findings include normal observations. Macula : microaneurysms.   Left Eye Progression has been stable. Disc findings include normal observations. Macula : microaneurysms.   Notes Proliferative diabetic retinopathy OU quiet, clear media, no active NVE growth                ASSESSMENT/PLAN:  Controlled type 2 diabetes mellitus with stable proliferative retinopathy of both eyes, with long-term current use of insulin (HCC) Good peripheral PRP OU.,  PDR quiet, no active disease      ICD-10-CM   1. Controlled type 2 diabetes mellitus with both eyes affected by proliferative retinopathy without macular edema, unspecified whether long term insulin use (HCC)  E11.3593 OCT, Retina - OU - Both Eyes    Color Fundus Photography Optos - OU - Both Eyes  2. Controlled type 2 diabetes mellitus with  stable proliferative retinopathy of both eyes, with long-term current use of insulin ([place])  H21.2248    Z79.4     1.  Bilateral PDR quiet, good PRP clear media we will continue to observe  2.  3.  Ophthalmic Meds Ordered this visit:  No orders of the defined types were placed in this encounter.      Return in about 7 months (around 04/09/2021) for DILATE OU, COLOR FP, OCT.  There are no Patient Instructions on file for this visit.   Explained the diagnoses, plan, and follow up with the patient and they expressed understanding.  Patient expressed understanding of the importance of proper follow up care.   Clent Demark Miia Blanks M.D. Diseases & Surgery of the Retina and Vitreous Retina & Diabetic [place] 09/07/20     Abbreviations: M myopia (nearsighted); A astigmatism; H hyperopia (farsighted); P presbyopia; Mrx spectacle prescription;  CTL contact lenses; OD right eye; OS left eye; OU both eyes  XT exotropia; ET esotropia; PEK punctate epithelial keratitis; PEE punctate epithelial erosions; DES dry eye syndrome; MGD meibomian gland dysfunction; ATs artificial tears; PFAT's preservative free artificial tears; [place] nuclear sclerotic cataract; PSC posterior subcapsular cataract; ERM epi-retinal membrane; PVD posterior vitreous detachment; RD retinal detachment; DM diabetes mellitus; DR diabetic retinopathy; NPDR non-proliferative diabetic retinopathy; PDR proliferative diabetic retinopathy; CSME clinically significant macular edema; DME diabetic macular edema; dbh dot blot hemorrhages; CWS cotton wool spot; POAG primary open angle glaucoma; C/D cup-to-disc ratio; HVF humphrey visual field; GVF goldmann visual field; OCT optical coherence tomography; IOP intraocular pressure; BRVO Branch retinal vein occlusion; CRVO central retinal vein occlusion; CRAO  central retinal artery occlusion; BRAO branch retinal artery occlusion; RT retinal tear; SB scleral buckle; PPV pars plana vitrectomy; VH  Vitreous hemorrhage; PRP panretinal laser photocoagulation; IVK intravitreal kenalog; VMT vitreomacular traction; MH Macular hole;  NVD neovascularization of the disc; NVE neovascularization elsewhere; AREDS age related eye disease study; ARMD age related macular degeneration; POAG primary open angle glaucoma; EBMD epithelial/anterior basement membrane dystrophy; ACIOL anterior chamber intraocular lens; IOL intraocular lens; PCIOL posterior chamber intraocular lens; Phaco/IOL phacoemulsification with intraocular lens placement; [place] photorefractive keratectomy; LASIK laser assisted in situ keratomileusis; HTN hypertension; DM diabetes mellitus; COPD chronic obstructive pulmonary disease

## 2020-09-07 NOTE — Progress Notes (Signed)
JYE, FARISS ([id number]) Visit Report for 09/07/2020 HPI Details Patient Name: Date of Service: Micheal Walls, Micheal Walls 09/07/2020 9:15 A M Medical Record Number: [id number] Patient Account Number: [id number] Date of Birth/Sex: Treating RN: 18-Feb-1936 (85 y.o. [name] Primary Care Provider: [name] Other Clinician: Referring Provider: Treating Provider/Extender: Lynden Ang in Treatment: 22 History of Present Illness HPI Description: 02/19/2020 upon evaluation today patient presents today for initial evaluation in clinic concerning issues that has been having with his bilateral lower extremities. He apparently also had a wound on his great toe. With that being said he does have a history of diabetes mellitus type 2, lymphedema, peripheral vascular disease, congestive heart failure, hypertension, right below-knee amputation, and chronic kidney disease stage IV according to records reviewed with his creatinine clearance being below 30. With that being said the legs have been actually worse than what they are currently he does not seem to have any open wounds at this point significantly he does have a small leaking area on the left leg at this point. With that being said the majority of what I see is a lot of skin changes secondary to the chronic lymphedema to be honest. There does not appear to be any signs of systemic infection or local infection based on what I see today. The patient is doing much better with regard to the wound on his toe as well according to the daughter. He has been seeing podiatry. They do have questions about whether or not he can perform physical therapy to have no limitations on physical therapy with regard to his wounds to be honest. With that being said he questions having some pain in the ankle region which again I am not really the specialist to comment on that therefore I recommended that he probably needs to discuss this with  his podiatrist. 02/26/2020 upon evaluation today patient appears to actually be doing extremely well he is completely healed and overall is looking excellent. They did get the compression stockings ordered yesterday so they should be on the way hopefully arriving if not tomorrow than Friday. In the meantime we may just utilize a Ace wrap to try to keep the edema under as much control as possible until he gets his compression stockings. READMISSION 04/03/2021 This is an 85 year old man we had for 2 visits in the clinic in September seen by Jeri Cos. Patient has chronic venous insufficiency with secondary lymphedema. He had wounds on the left anterior leg these healed out. He is also known to Dr. Doren Custard last seen in August 2020. He is known to have an occluded PTA with monophasic waveforms has an 8 occluded tibioperoneal trunk. At the time he was not felt to be a candidate for angiography because of his stage IV chronic renal failure and his wounds were closed. If he was to require angiography he would need a CO2 angiogram. His last arterial studies were in August 2020 as well. He had triphasic waveforms down to the mid popliteal biphasic distally and then monophasic at the dorsalis of the tibioperoneal trunk, ATA occluded PTA monophasic distal PTA his peroneal was not visualized. He wears compression stockings but did not bring 1 in today He thinks the wound currently we are looking at started about 2 weeks ago. He thinks it is because of abrasion on his foot wear. He has not been wearing his shoe he switched into a left surgical sandal. He has been cleaning this with saline and wrapping it  with gauze. ABI in our clinic was 0.52. Please visit previous arterial work-up by Dr. Doren Custard in August 2020 as noted. 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 We use silver collagen. He has known PAD and very significant lymphedema. I have not ordered arterial studies or vascular consult unless this area worsens 11/12; this is a patient with a punched-out wound over the interphalangeal joint of his left great toe dorsally. He has severe PAD which is not listed is being amenable to revascularization or angiography because of his stage IV chronic renal failure. We have been using collagen 12/3; punched-out area over the inner phalangeal joint of the left great toe in the setting of known PAD. He was not felt previously to be a good revascularization candidate because of renal insufficiency. We have been using collagen. The patient states he noticed this draining about 2 to 3 days ago. When he arrived in clinic our intake nurse noted purulent drainage and wound a lot larger especially in depth of 0.7 cm 12/10; patient with a wound on the dorsal part in the inner phalangeal joint of the left great toe. This deteriorated quite a bit the last time he was here. A culture I did showed Staphylococcus lugdunensis. I put him on doxycycline and I will extend this today. X-ray showed no evidence of osteomyelitis. Really not any better today there is exposed bone at the bottom of this undermining that is hard to measure 12/17; is a patient with a wound on the dorsal part of the inter phalangeal joint of his left great toe. I have him on antibiotics and I gave him a 2-week course of doxycycline this seems to have close down the wound quite a bit and there is no longer palpable bone. 12/28; dorsal part of the interphalangeal joint of the left great toe. There is no palpable bone which is improvement. He completed antibiotics. Has been using silver collagen 1/11; dorsal part of the interphalangeal joint of the left great toe. Again today there was exposed bone. This is not particularly new but had not been obvious over the last 2 weeks. We have been  using Hydrofera Blue. X-ray I did last month was negative for osteomyelitis 1/25; dorsal part of the inner phalangeal joint of the left great toe. BONE SCRAPING for PCR I did last time was completely negative, no pathogens identified. We have been using Hydrofera Blue with no improvement 2/1; dorsal part of the interphalangeal joint. He also has a new area on the plantar part of the left great toe. We have not nearly been making much improvement here. He does have known PAD 2/15; since the patient was last here he was admitted to hospital from 07/18/2020 through 07/22/2020. He was initially felt to have a possible CVA right vertebral artery stenosis and acute kidney injury. Neurology recommended aspirin and Plavix. They noted the wound on the left great toe felt that he had sepsis secondary to a diabetic left foot infection. An MRI was ordered that showed abnormal marrow edema in the distal and proximal phalanx of the great toe centered at the interphalangeal joint the patient has an erosion at the head of the proximal phalanx of the great toe there was no joint effusion no other significant abnormalities. It was felt that this might represent either gout or osteomyelitis.  The patient was seen by Dr. Gale Journey of infectious disease. He did receive ceftriaxone and Flagyl which was converted to Augmentin and doxycycline until today. He also has severe PAD. He has a follow-up with Dr. Gale Journey in early March on March 2 at 2 PM. At that point inflammatory markers assessment to see if he needs additional antibiotic therapy. 08/24/2020; the patient comes back to clinic after an almost 1 month hiatus. During this time he was admitted to hospital from 2/27 through 3/3. During this admission he was felt to more certainly have infection as the cause of this with an elevated lactic acid level. X-ray of the foot was negative. Sedimentation rate of 55 CRP at 26.4. He underwent an angiogram by Dr. Trula Slade on 3/1 with balloon  angioplasty of the left lower extremity anterior tibials. Infectious disease also saw the patient. He was discharged on Zyvox and Augmentin until 3/9 and then doxycycline and Augmentin until 3/19 which she is currently taking. He is on Plavix and aspirin. He is to follow-up with vascular surgery in 3 weeks or so for repeat vascular duplex ultrasound with ABI. They are using Prisma to the open areas on the left great toe. The patient denies any systemic symptoms or pain 3/8. The patient went to see vascular last week. He had arterial studies unfortunately wraps were not taken off on the left he had noncompressible ABIs. They did not do a TBI because of bandages. He has had a previous right BKA. He went on to see vein and vascular in the clinic he underwent an angiogram on 08/11/2020 and had angioplasty of the left ATA by Dr. Trula Slade. Apparently Dr. Gwenlyn Saran was involved in the patient's visit and recommended an amputation. They also sent in Keflex 500 twice daily but the patient has not picked this up. Both wounds do not look good. The area over the dorsal interphalangeal joint goes straight the bone in fact most of this is exposed joint. 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 Finally the patient also saw infectious disease with regards to the left first toe osteomyelitis. He has completed Augmentin and doxycycline last week they did order CBC comprehensive metabolic panel and C-reactive protein especially the lateral see if I can track that down. Electronic Signature(s) Signed: 09/07/2020 5:22:38 PM By: Linton Ham MD Entered By: Linton Ham on 09/07/2020 11:05:17 -------------------------------------------------------------------------------- Physical Exam Details Patient Name: Date of Service: Micheal Quails D. 09/07/2020 9:15 A M Medical Record Number:  [id number] Patient Account Number: [id number] Date of Birth/Sex: Treating RN: 06/15/1935 (85 y.o. [name] Primary Care Provider: [name] Other Clinician: Referring Provider: Treating Provider/Extender: Lynden Ang in Treatment: 22 Cardiovascular Popliteal pulses palpable. He has a very faint dorsalis pedis pulse on the left and a posterior tibial pulses also palpable but again very faint.. Notes Wound exam; open wound on the dorsal interphalangeal joint of the left great toe goes to bone. The tip of the left toe small wound but I think that also goes to bone. He has an ischemic area below this which looks like it is on its way to gangrene. Electronic Signature(s) Signed: 09/07/2020 5:22:38 PM By: Linton Ham MD Entered By: Linton Ham on 09/07/2020 11:06:08 -------------------------------------------------------------------------------- Physician Orders Details Patient Name: Date of Service: Micheal Quails D. 09/07/2020 9:15 A M Medical Record  Number: [id number] Patient Account Number: [id number] Date of Birth/Sex: Treating RN: 08/07/1935 (85 y.o. [name] Primary Care Provider: [name] Other Clinician: Referring Provider: Treating Provider/Extender: Lynden Ang in Treatment: 3 Verbal / Phone Orders: No Diagnosis Coding ICD-10 Coding Code Description E11.621 Type 2 diabetes mellitus with foot ulcer E11.51 Type 2 diabetes mellitus with diabetic peripheral angiopathy without gangrene L97.528 Non-pressure chronic ulcer of other part of left foot with other specified severity M86.672 Other chronic osteomyelitis, left ankle and foot Follow-up Appointments Return Appointment in 2 weeks. Bathing/ Shower/ Hygiene May shower and wash wound with soap and water. Edema Control - Lymphedema / SCD / Other Bilateral Lower Extremities Elevate legs to the level of the heart or above for 30 minutes daily  and/or when sitting, a frequency of: - throughout the day Avoid standing for long periods of time. Patient to wear own compression stockings every day. - both legs Exercise regularly Moisturize legs daily. Off-Loading Wound #1 Left T Great oe Other: - use felt for offloading Wound #2 Left,Plantar T Great oe Other: - use felt for offloading Home Health No change in wound care orders this week; continue Home Health for wound care. May utilize formulary equivalent dressing for wound treatment orders unless otherwise specified. Other Home Health Orders/Instructions: - [facility] Wound Treatment Wound #1 - T Great oe Wound Laterality: Left Cleanser: Soap and Water Every Other Day/15 Days Discharge Instructions: May shower and wash wound with dial antibacterial soap and water prior to dressing change. Cleanser: Normal Saline (Generic) Every Other Day/15 Days Discharge Instructions: Cleanse the wound with Normal Saline or wound cleanser prior to applying a clean dressing using gauze sponges, not tissue or cotton balls. Prim Dressing: Promogran Prisma Matrix, 4.34 (sq in) (silver collagen) Every Other Day/15 Days ary Discharge Instructions: Moisten collagen with saline or hydrogel Secondary Dressing: Woven Gauze Sponges 2x2 in (Generic) Every Other Day/15 Days Discharge Instructions: Apply over primary dressing as directed. Secondary Dressing: Optifoam Non-Adhesive Dressing, 4x4 in Every Other Day/15 Days Discharge Instructions: Use foam donut for offloading Secured With: Conforming Stretch Gauze Bandage, Sterile 2x75 (in/in) (Generic) Every Other Day/15 Days Discharge Instructions: Secure with stretch gauze as directed. Secured With: Paper Tape, 1x10 (in/yd) Every Other Day/15 Days Discharge Instructions: Secure dressing with tape as directed. Wound #2 - T Great oe Wound Laterality: Plantar, Left Cleanser: Soap and Water Every Other Day/15 Days Discharge Instructions: May shower and  wash wound with dial antibacterial soap and water prior to dressing change. Cleanser: Normal Saline (Generic) Every Other Day/15 Days Discharge Instructions: Cleanse the wound with Normal Saline or wound cleanser prior to applying a clean dressing using gauze sponges, not tissue or cotton balls. Prim Dressing: Promogran Prisma Matrix, 4.34 (sq in) (silver collagen) Every Other Day/15 Days ary Discharge Instructions: Moisten collagen with saline or hydrogel Secondary Dressing: Woven Gauze Sponges 2x2 in (Generic) Every Other Day/15 Days Discharge Instructions: Apply over primary dressing as directed. Secondary Dressing: Optifoam Non-Adhesive Dressing, 4x4 in Every Other Day/15 Days Discharge Instructions: Use foam donut for offloading Secured With: Conforming Stretch Gauze Bandage, Sterile 2x75 (in/in) (Generic) Every Other Day/15 Days Discharge Instructions: Secure with stretch gauze as directed. Secured With: Paper Tape, 1x10 (in/yd) Every Other Day/15 Days Discharge Instructions: Secure dressing with tape as directed. Electronic Signature(s) Signed: 09/07/2020 5:22:38 PM By: Linton Ham MD Signed: 09/07/2020 5:28:50 PM By: Levan Hurst RN, BSN Entered By: Levan Hurst on 09/07/2020 10:49:36 -------------------------------------------------------------------------------- Problem List Details Patient Name: Date of Service:  Chervenak, EV ERETT D. 09/07/2020 9:15 A M Medical Record Number: [id number] Patient Account Number: [id number] Date of Birth/Sex: Treating RN: [birth year] (85 y.o. [name] Primary Care Provider: [name] Other Clinician: Referring Provider: Treating Provider/Extender: Lynden Ang in Treatment: 44 Active Problems ICD-10 Encounter Code Description Active Date MDM Diagnosis E11.621 Type 2 diabetes mellitus with foot ulcer 04/03/2020 No Yes E11.51 Type 2 diabetes mellitus with diabetic peripheral angiopathy without gangrene  04/03/2020 No Yes L97.528 Non-pressure chronic ulcer of other part of left foot with other specified 07/07/2020 No Yes severity M86.672 Other chronic osteomyelitis, left ankle and foot 08/25/2020 No Yes Inactive Problems Resolved Problems Electronic Signature(s) Signed: 09/07/2020 5:22:38 PM By: Linton Ham MD Entered By: Linton Ham on 09/07/2020 11:01:03 -------------------------------------------------------------------------------- Progress Note Details Patient Name: Date of Service: Micheal Quails D. 09/07/2020 9:15 A M Medical Record Number: [id number] Patient Account Number: [id number] Date of Birth/Sex: Treating RN: 06-28-1935 (85 y.o. [name] Primary Care Provider: [name] Other Clinician: Referring Provider: Treating Provider/Extender: Lynden Ang in Treatment: 22 Subjective History of Present Illness (HPI) 02/19/2020 upon evaluation today patient presents today for initial evaluation in clinic concerning issues that has been having with his bilateral lower extremities. He apparently also had a wound on his great toe. With that being said he does have a history of diabetes mellitus type 2, lymphedema, peripheral vascular disease, congestive heart failure, hypertension, right below-knee amputation, and chronic kidney disease stage IV according to records reviewed with his creatinine clearance being below 30. With that being said the legs have been actually worse than what they are currently he does not seem to have any open wounds at this point significantly he does have a small leaking area on the left leg at this point. With that being said the majority of what I see is a lot of skin changes secondary to the chronic lymphedema to be honest. There does not appear to be any signs of systemic infection or local infection based on what I see today. The patient is doing much better with regard to the wound on his toe as well according to  the daughter. He has been seeing podiatry. They do have questions about whether or not he can perform physical therapy to have no limitations on physical therapy with regard to his wounds to be honest. With that being said he questions having some pain in the ankle region which again I am not really the specialist to comment on that therefore I recommended that he probably needs to discuss this with his podiatrist. 02/26/2020 upon evaluation today patient appears to actually be doing extremely well he is completely healed and overall is looking excellent. They did get the compression stockings ordered yesterday so they should be on the way hopefully arriving if not tomorrow than Friday. In the meantime we may just utilize a Ace wrap to try to keep the edema under as much control as possible until he gets his compression stockings. READMISSION 04/03/2021 This is an 85 year old man we had for 2 visits in the clinic in September seen by Jeri Cos. Patient has chronic venous insufficiency with secondary lymphedema. He had wounds on the left anterior leg these healed out. He is also known to Dr. Doren Custard last seen in August 2020. He is known to have an occluded PTA with monophasic waveforms has an 8 occluded tibioperoneal trunk. At the time he was not felt to be a candidate for angiography because of  his stage IV chronic renal failure and his wounds were closed. If he was to require angiography he would need a CO2 angiogram. His last arterial studies were in August 2020 as well. He had triphasic waveforms down to the mid popliteal biphasic distally and then monophasic at the dorsalis of the tibioperoneal trunk, ATA occluded PTA monophasic distal PTA his peroneal was not visualized. He wears compression stockings but did not bring 1 in today He thinks the wound currently we are looking at started about 2 weeks ago. He thinks it is because of abrasion on his foot wear. He has not been wearing his shoe he  switched into a left surgical sandal. He has been cleaning this with saline and wrapping it with gauze. ABI in our clinic was 0.52. Please visit previous arterial work-up by Dr. Doren Custard in August 2020 as noted. 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 We use silver collagen. He has known PAD and very significant lymphedema. I have not ordered arterial studies or vascular consult unless this area worsens 11/12; this is a patient with a punched-out wound over the interphalangeal joint of his left great toe dorsally. He has severe PAD which is not listed is being amenable to revascularization or angiography because of his stage IV chronic renal failure. We have been using collagen 12/3; punched-out area over the inner phalangeal joint of the left great toe in the setting of known PAD. He was not felt previously to be a good revascularization candidate because of renal insufficiency. We have been using collagen. The patient states he noticed this draining about 2 to 3 days ago. When he arrived in clinic our intake nurse noted purulent drainage and wound a lot larger especially in depth of 0.7 cm 12/10; patient with a wound on the dorsal part in the inner phalangeal joint of the left great toe. This deteriorated quite a bit the last time he was here. A culture I did showed Staphylococcus lugdunensis. I put him on doxycycline and I will extend this today. X-ray showed no evidence of osteomyelitis. Really not any better today there is exposed bone at the bottom of this undermining that is hard to measure 12/17; is a patient with a wound on the dorsal part of the inter phalangeal joint of his left great toe. I have him on antibiotics and I gave him a 2-week course of doxycycline this seems to have close down the wound quite a bit and there  is no longer palpable bone. 12/28; dorsal part of the interphalangeal joint of the left great toe. There is no palpable bone which is improvement. He completed antibiotics. Has been using silver collagen 1/11; dorsal part of the interphalangeal joint of the left great toe. Again today there was exposed bone. This is not particularly new but had not been obvious over the last 2 weeks. We have been using Hydrofera Blue. X-ray I did last month was negative for osteomyelitis 1/25; dorsal part of the inner phalangeal joint of the left great toe. BONE SCRAPING for PCR I did last time was completely negative, no pathogens identified. We have been using Hydrofera Blue with no improvement 2/1; dorsal part of the interphalangeal joint. He also has a new area on the plantar part of the left great toe. We have not nearly been making much improvement  here. He does have known PAD 2/15; since the patient was last here he was admitted to hospital from 07/18/2020 through 07/22/2020. He was initially felt to have a possible CVA right vertebral artery stenosis and acute kidney injury. Neurology recommended aspirin and Plavix. They noted the wound on the left great toe felt that he had sepsis secondary to a diabetic left foot infection. An MRI was ordered that showed abnormal marrow edema in the distal and proximal phalanx of the great toe centered at the interphalangeal joint the patient has an erosion at the head of the proximal phalanx of the great toe there was no joint effusion no other significant abnormalities. It was felt that this might represent either gout or osteomyelitis. The patient was seen by Dr. Gale Journey of infectious disease. He did receive ceftriaxone and Flagyl which was converted to Augmentin and doxycycline until today. He also has severe PAD. He has a follow-up with Dr. Gale Journey in early March on March 2 at 2 PM. At that point inflammatory markers assessment to see if he needs additional antibiotic  therapy. 08/24/2020; the patient comes back to clinic after an almost 1 month hiatus. During this time he was admitted to hospital from 2/27 through 3/3. During this admission he was felt to more certainly have infection as the cause of this with an elevated lactic acid level. X-ray of the foot was negative. Sedimentation rate of 55 CRP at 26.4. He underwent an angiogram by Dr. Trula Slade on 3/1 with balloon angioplasty of the left lower extremity anterior tibials. Infectious disease also saw the patient. He was discharged on Zyvox and Augmentin until 3/9 and then doxycycline and Augmentin until 3/19 which she is currently taking. He is on Plavix and aspirin. He is to follow-up with vascular surgery in 3 weeks or so for repeat vascular duplex ultrasound with ABI. They are using Prisma to the open areas on the left great toe. The patient denies any systemic symptoms or pain 3/8. The patient went to see vascular last week. He had arterial studies unfortunately wraps were not taken off on the left he had noncompressible ABIs. They did not do a TBI because of bandages. He has had a previous right BKA. He went on to see vein and vascular in the clinic he underwent an angiogram on 08/11/2020 and had angioplasty of the left ATA by Dr. Trula Slade. Apparently Dr. Gwenlyn Saran was involved in the patient's visit and recommended an amputation. They also sent in Keflex 500 twice daily but the patient has not picked this up. Both wounds do not look good. The area over the dorsal interphalangeal joint goes straight the bone in fact most of this is exposed joint. 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 Finally the patient also saw infectious disease with regards to the left first toe osteomyelitis. He has completed Augmentin and doxycycline last week they did order CBC comprehensive metabolic  panel and C-reactive protein especially the lateral see if I can track that down. Objective Constitutional Vitals Time Taken: 10:00 AM, Height: 73 in, Weight: 275 lbs, BMI: 36.3, Temperature: 97.9 F, Pulse: 66 bpm, Respiratory Rate: 18 breaths/min, Blood Pressure: 97/54 mmHg, Capillary Blood Glucose: 136 mg/dl. Cardiovascular Popliteal pulses palpable. He has a very faint dorsalis pedis pulse on the left and a posterior tibial pulses also palpable but again very faint.Marland Kitchen  General Notes: Wound exam; open wound on the dorsal interphalangeal joint of the left great toe goes to bone. The tip of the left toe small wound but I think that also goes to bone. He has an ischemic area below this which looks like it is on its way to gangrene. Integumentary (Hair, Skin) Wound #1 status is Open. Original cause of wound was Blister. The date acquired was: 03/27/2020. The wound has been in treatment 22 weeks. The wound is located on the Left T Great. The wound measures 0.6cm length x 0.6cm width x 0.5cm depth; 0.283cm^2 area and 0.141cm^3 volume. There is bone and Fat oe Layer (Subcutaneous Tissue) exposed. There is no tunneling or undermining noted. There is a medium amount of serosanguineous drainage noted. The wound margin is distinct with the outline attached to the wound base. There is medium (34-66%) red granulation within the wound bed. There is a small (1-33%) amount of necrotic tissue within the wound bed including Eschar and Adherent Slough. General Notes: Callused Periwound Wound #2 status is Open. Original cause of wound was Pressure Injury. The date acquired was: 07/14/2020. The wound has been in treatment 7 weeks. The wound is located on the [employer]. The wound measures 1.7cm length x 0.9cm width x 0.1cm depth; 1.202cm^2 area and 0.12cm^3 volume. There oe is Fat Layer (Subcutaneous Tissue) exposed. There is no tunneling or undermining noted. There is a medium amount of serosanguineous  drainage noted. The wound margin is well defined and not attached to the wound base. There is medium (34-66%) red granulation within the wound bed. There is a medium (34-66%) amount of necrotic tissue within the wound bed including Eschar and Adherent Slough. General Notes: Callused periwound Assessment Active Problems ICD-10 Type 2 diabetes mellitus with foot ulcer Type 2 diabetes mellitus with diabetic peripheral angiopathy without gangrene Non-pressure chronic ulcer of other part of left foot with other specified severity Other chronic osteomyelitis, left ankle and foot Plan Follow-up Appointments: Return Appointment in 2 weeks. Bathing/ Shower/ Hygiene: May shower and wash wound with soap and water. Edema Control - Lymphedema / SCD / Other: Elevate legs to the level of the heart or above for 30 minutes daily and/or when sitting, a frequency of: - throughout the day Avoid standing for long periods of time. Patient to wear own compression stockings every day. - both legs Exercise regularly Moisturize legs daily. Off-Loading: Wound #1 Left T Great: oe Other: - use felt for offloading Wound #2 Left,Plantar T Great: oe Other: - use felt for offloading Home Health: No change in wound care orders this week; continue Home Health for wound care. May utilize formulary equivalent dressing for wound treatment orders unless otherwise specified. Other Home Health Orders/Instructions: - [facility] WOUND #1: - T Great Wound Laterality: Left oe Cleanser: Soap and Water Every Other Day/15 Days Discharge Instructions: May shower and wash wound with dial antibacterial soap and water prior to dressing change. Cleanser: Normal Saline (Generic) Every Other Day/15 Days Discharge Instructions: Cleanse the wound with Normal Saline or wound cleanser prior to applying a clean dressing using gauze sponges, not tissue or cotton balls. Prim Dressing: Promogran Prisma Matrix, 4.34 (sq in) (silver  collagen) Every Other Day/15 Days ary Discharge Instructions: Moisten collagen with saline or hydrogel Secondary Dressing: Woven Gauze Sponges 2x2 in (Generic) Every Other Day/15 Days Discharge Instructions: Apply over primary dressing as directed. Secondary Dressing: Optifoam Non-Adhesive Dressing, 4x4 in Every Other Day/15 Days Discharge Instructions: Use foam donut for offloading Secured  With: Conforming Stretch Gauze Bandage, Sterile 2x75 (in/in) (Generic) Every Other Day/15 Days Discharge Instructions: Secure with stretch gauze as directed. Secured With: Paper T ape, 1x10 (in/yd) Every Other Day/15 Days Discharge Instructions: Secure dressing with tape as directed. WOUND #2: - T Great Wound Laterality: Plantar, Left oe Cleanser: Soap and Water Every Other Day/15 Days Discharge Instructions: May shower and wash wound with dial antibacterial soap and water prior to dressing change. Cleanser: Normal Saline (Generic) Every Other Day/15 Days Discharge Instructions: Cleanse the wound with Normal Saline or wound cleanser prior to applying a clean dressing using gauze sponges, not tissue or cotton balls. Prim Dressing: Promogran Prisma Matrix, 4.34 (sq in) (silver collagen) Every Other Day/15 Days ary Discharge Instructions: Moisten collagen with saline or hydrogel Secondary Dressing: Woven Gauze Sponges 2x2 in (Generic) Every Other Day/15 Days Discharge Instructions: Apply over primary dressing as directed. Secondary Dressing: Optifoam Non-Adhesive Dressing, 4x4 in Every Other Day/15 Days Discharge Instructions: Use foam donut for offloading Secured With: Conforming Stretch Gauze Bandage, Sterile 2x75 (in/in) (Generic) Every Other Day/15 Days Discharge Instructions: Secure with stretch gauze as directed. Secured With: Paper T ape, 1x10 (in/yd) Every Other Day/15 Days Discharge Instructions: Secure dressing with tape as directed. 1. We continued with the silver collagen to the wound  areas 2. Extensive discussion about amputation. The major issue is whether there is enough blood flow to heal the toe amputation or not. The patient relates his experience on the right in which he said he lost the toe then a transmet and then a BKA does not want to go through something like that again. 3. I do not think there is need for urgent amputation at this point there is no pain no gross infection but I do not know that this should be put off indefinitely. 4. The patient was prescribed Keflex on Friday he seemed unaware of this we directed him to pick this up Electronic Signature(s) Signed: 09/07/2020 5:22:38 PM By: Linton Ham MD Entered By: Linton Ham on 09/07/2020 11:07:25 -------------------------------------------------------------------------------- SuperBill Details Patient Name: Date of Service: Micheal Quails D. 09/07/2020 Medical Record Number: [id number] Patient Account Number: [id number] Date of Birth/Sex: Treating RN: [birth year] (85 y.o. [name] Primary Care Provider: [name] Other Clinician: Referring Provider: Treating Provider/Extender: Lynden Ang in Treatment: 22 Diagnosis Coding ICD-10 Codes Code Description E11.621 Type 2 diabetes mellitus with foot ulcer E11.51 Type 2 diabetes mellitus with diabetic peripheral angiopathy without gangrene L97.528 Non-pressure chronic ulcer of other part of left foot with other specified severity M86.672 Other chronic osteomyelitis, left ankle and foot Facility Procedures CPT4 Code: [id number] Description: 99214 - WOUND CARE VISIT-LEV 4 EST PT Modifier: Quantity: 1 Physician Procedures : CPT4 Code Description Modifier [id number] - WC PHYS LEVEL 3 - EST PT ICD-10 Diagnosis Description L97.528 Non-pressure chronic ulcer of other part of left foot with other specified severity E11.51 Type 2 diabetes mellitus with diabetic peripheral  angiopathy without gangrene M86.672 Other  chronic osteomyelitis, left ankle and foot Quantity: 1 Electronic Signature(s) Signed: 09/07/2020 5:22:38 PM By: Linton Ham MD Signed: 09/07/2020 5:28:50 PM By: Levan Hurst RN, BSN Entered By: Levan Hurst on 09/07/2020 15:11:27

## 2020-09-07 NOTE — Assessment & Plan Note (Signed)
Good peripheral PRP OU.,  PDR quiet, no active disease

## 2020-09-09 DIAGNOSIS — A419 Sepsis, unspecified organism: Secondary | ICD-10-CM | POA: Diagnosis not present

## 2020-09-09 DIAGNOSIS — I69398 Other sequelae of cerebral infarction: Secondary | ICD-10-CM | POA: Diagnosis not present

## 2020-09-09 DIAGNOSIS — E11621 Type 2 diabetes mellitus with foot ulcer: Secondary | ICD-10-CM | POA: Diagnosis not present

## 2020-09-09 DIAGNOSIS — L03116 Cellulitis of left lower limb: Secondary | ICD-10-CM | POA: Diagnosis not present

## 2020-09-09 DIAGNOSIS — R531 Weakness: Secondary | ICD-10-CM | POA: Diagnosis not present

## 2020-09-09 DIAGNOSIS — L97522 Non-pressure chronic ulcer of other part of left foot with fat layer exposed: Secondary | ICD-10-CM | POA: Diagnosis not present

## 2020-09-09 NOTE — Progress Notes (Signed)
FLYNT, BREEZE ([id number]) Visit Report for 09/07/2020 Arrival Information Details Patient Name: Date of Service: Micheal Walls, Micheal Walls 09/07/2020 9:15 A M Medical Record Number: [id number] Patient Account Number: [id number] Date of Birth/Sex: Treating RN: 03/11/1936 (85 y.o. Micheal Walls Primary Care Lailanie Hasley: [name] Other Clinician: Referring Caitlan Chauca: Treating Drewey Begue/Extender: Lynden Ang in Treatment: 73 Visit Information History Since Last Visit Added or deleted any medications: No Patient Arrived: Wheel Chair Any new allergies or adverse reactions: No Arrival Time: 09:59 Had a fall or experienced change in No Accompanied By: son activities of daily living that may affect Transfer Assistance: None risk of falls: Patient Identification Verified: Yes Signs or symptoms of abuse/neglect since last visito No Secondary Verification Process Completed: Yes Hospitalized since last visit: No Patient Requires Transmission-Based Precautions: No Implantable device outside of the clinic excluding No Patient Has Alerts: No cellular tissue based products placed in the center since last visit: Has Dressing in Place as Prescribed: Yes Pain Present Now: No Electronic Signature(s) Signed: 09/09/2020 9:05:34 AM By: Sandre Kitty Entered By: Sandre Kitty on 09/07/2020 10:00:26 -------------------------------------------------------------------------------- Clinic Level of Care Assessment Details Patient Name: Date of Service: Micheal Walls 09/07/2020 9:15 A M Medical Record Number: [id number] Patient Account Number: [id number] Date of Birth/Sex: Treating RN: 11-13-1935 (85 y.o. Micheal Walls Primary Care Nicol Herbig: [name] Other Clinician: Referring Rahim Astorga: Treating Ravinder Hofland/Extender: Lynden Ang in Treatment: 61 Clinic Level of Care Assessment Items TOOL 4 Quantity Score X- 1 0 Use when only an  EandM is performed on FOLLOW-UP visit ASSESSMENTS - Nursing Assessment / Reassessment X- 1 10 Reassessment of Co-morbidities (includes updates in patient status) X- 1 5 Reassessment of Adherence to Treatment Plan ASSESSMENTS - Wound and Skin A ssessment / Reassessment []  - 0 Simple Wound Assessment / Reassessment - one wound X- 2 5 Complex Wound Assessment / Reassessment - multiple wounds []  - 0 Dermatologic / Skin Assessment (not related to wound area) ASSESSMENTS - Focused Assessment []  - 0 Circumferential Edema Measurements - multi extremities []  - 0 Nutritional Assessment / Counseling / Intervention X- 1 5 Lower Extremity Assessment (monofilament, tuning fork, pulses) []  - 0 Peripheral Arterial Disease Assessment (using hand held doppler) ASSESSMENTS - Ostomy and/or Continence Assessment and Care []  - 0 Incontinence Assessment and Management []  - 0 Ostomy Care Assessment and Management (repouching, etc.) PROCESS - Coordination of Care X - Simple Patient / Family Education for ongoing care 1 15 []  - 0 Complex (extensive) Patient / Family Education for ongoing care X- 1 10 Staff obtains Consents, Records, T Results / Process Orders est X- 1 10 Staff telephones HHA, Nursing Homes / Clarify orders / etc []  - 0 Routine Transfer to another Facility (non-emergent condition) []  - 0 Routine Hospital Admission (non-emergent condition) []  - 0 New Admissions / Biomedical engineer / Ordering NPWT Apligraf, etc. , []  - 0 Emergency Hospital Admission (emergent condition) X- 1 10 Simple Discharge Coordination []  - 0 Complex (extensive) Discharge Coordination PROCESS - Special Needs []  - 0 Pediatric / Minor Patient Management []  - 0 Isolation Patient Management []  - 0 Hearing / Language / Visual special needs []  - 0 Assessment of Community assistance (transportation, D/C planning, etc.) []  - 0 Additional assistance / Altered mentation []  - 0 Support Surface(s)  Assessment (bed, cushion, seat, etc.) INTERVENTIONS - Wound Cleansing / Measurement []  - 0 Simple Wound Cleansing - one wound X- 2 5 Complex Wound Cleansing - multiple wounds X-  1 5 Wound Imaging (photographs - any number of wounds) $RemoveBe'[]'fHkIwrdkn$  - 0 Wound Tracing (instead of photographs) $RemoveBeforeD'[]'vqVTbDLZfChEIA$  - 0 Simple Wound Measurement - one wound X- 2 5 Complex Wound Measurement - multiple wounds INTERVENTIONS - Wound Dressings X - Small Wound Dressing one or multiple wounds 1 10 $Re'[]'BMT$  - 0 Medium Wound Dressing one or multiple wounds $RemoveBeforeD'[]'FUWjEgjKAKRoYo$  - 0 Large Wound Dressing one or multiple wounds X- 1 5 Application of Medications - topical $RemoveB'[]'PnRFnCVv$  - 0 Application of Medications - injection INTERVENTIONS - Miscellaneous $RemoveBeforeD'[]'jIepDRfWPJRYbF$  - 0 External ear exam $Remove'[]'QgNnFLo$  - 0 Specimen Collection (cultures, biopsies, blood, body fluids, etc.) $RemoveBefor'[]'QrnmeebggBaU$  - 0 Specimen(s) / Culture(s) sent or taken to Lab for analysis $RemoveBefo'[]'snMkZtJvUTB$  - 0 Patient Transfer (multiple staff / Civil Service fast streamer / Similar devices) $RemoveBeforeDE'[]'WBPGPQkCMorisCv$  - 0 Simple Staple / Suture removal (25 or less) $Remove'[]'zMWoHco$  - 0 Complex Staple / Suture removal (26 or more) $Remove'[]'ldmydVq$  - 0 Hypo / Hyperglycemic Management (close monitor of Blood Glucose) $RemoveBefore'[]'PzAtRlQBDonRy$  - 0 Ankle / Brachial Index (ABI) - do not check if billed separately X- 1 5 Vital Signs Has the patient been seen at the hospital within the last three years: Yes Total Score: 120 Level Of Care: New/Established - Level 4 Electronic Signature(s) Signed: 09/07/2020 5:28:50 PM By: Levan Hurst RN, BSN Entered By: Levan Hurst on 09/07/2020 15:11:21 -------------------------------------------------------------------------------- Encounter Discharge Information Details Patient Name: Date of Service: Micheal Quails D. 09/07/2020 9:15 A M Medical Record Number: [id number] Patient Account Number: [id number] Date of Birth/Sex: Treating RN: 09-Dec-1935 (85 y.o. Micheal Walls Primary Care Ronee Ranganathan: [name] Other Clinician: Referring Montrice Gracey: Treating Fitzhugh Vizcarrondo/Extender: Lynden Ang in Treatment: 70 Encounter Discharge Information Items Discharge Condition: Stable Ambulatory Status: Wheelchair Discharge Destination: Home Transportation: Private Auto Accompanied By: Son Schedule Follow-up Appointment: Yes Clinical Summary of Care: Provided on 09/07/2020 Form Type Recipient Paper Patient Patient Electronic Signature(s) Signed: 09/07/2020 11:43:29 AM By: Lorrin Jackson Entered By: Lorrin Jackson on 09/07/2020 11:43:29 -------------------------------------------------------------------------------- Lower Extremity Assessment Details Patient Name: Date of Service: Micheal Quails D. 09/07/2020 9:15 A M Medical Record Number: [id number] Patient Account Number: [id number] Date of Birth/Sex: Treating RN: November 16, 1935 (85 y.o. Micheal Walls Primary Care Tarquin Welcher: [name] Other Clinician: Referring Tarisa Paola: Treating Hendricks Schwandt/Extender: Lynden Ang in Treatment: 22 Edema Assessment Assessed: Shirlyn Goltz: Yes] Patrice Paradise: No] Edema: [Left: Ye] [Right: s] Calf Left: Right: Point of Measurement: 40 cm From Medial Instep 42 cm Ankle Left: Right: Point of Measurement: 13 cm From Medial Instep 31.4 cm Vascular Assessment Pulses: Dorsalis Pedis Palpable: [Left:No] Electronic Signature(s) Signed: 09/07/2020 5:20:59 PM By: Lorrin Jackson Entered By: Lorrin Jackson on 09/07/2020 10:20:56 -------------------------------------------------------------------------------- Multi Wound Chart Details Patient Name: Date of Service: Micheal Quails D. 09/07/2020 9:15 A M Medical Record Number: [id number] Patient Account Number: [id number] Date of Birth/Sex: Treating RN: 1935-08-27 (85 y.o. Micheal Walls Primary Care Maille Halliwell: [name] Other Clinician: Referring Shenandoah Yeats: Treating Danetra Glock/Extender: Lynden Ang in Treatment: 22 Vital Signs Height(in): 28 Capillary Blood Glucose(mg/dl):  136 Weight(lbs): 275 Pulse(bpm): 62 Body Mass Index(BMI): 58 Blood Pressure(mmHg): 97/54 Temperature(F): 97.9 Respiratory Rate(breaths/min): 18 Photos: [1:No Photos Left T Great oe] [2:No Photos Left, Plantar T Great oe] [N/A:N/A N/A] Wound Location: [1:Blister] [2:Pressure Injury] [N/A:N/A] Wounding Event: [1:Diabetic Wound/Ulcer of the Lower] [2:Diabetic Wound/Ulcer of the Lower] [N/A:N/A] Primary Etiology: [1:Extremity Cataracts, Anemia, Lymphedema,] [2:Extremity Cataracts, Anemia, Lymphedema,] [N/A:N/A] Comorbid History: [1:Sleep Apnea, Congestive Heart Failure, Hypertension, Peripheral Arterial Disease, Peripheral Venous Arterial Disease, Peripheral Venous Disease,  Type II Diabetes, End Stage Disease, Type II Diabetes, End Stage Renal Disease, Gout,  Neuropathy 03/27/2020] [2:Sleep Apnea, Congestive Heart Failure, Hypertension, Peripheral Renal Disease, Gout, Neuropathy 07/14/2020] [N/A:N/A] Date Acquired: [1:22] [2:7] [N/A:N/A] Weeks of Treatment: [1:Open] [2:Open] [N/A:N/A] Wound Status: [1:0.6x0.6x0.5] [2:1.7x0.9x0.1] [N/A:N/A] Measurements L x W x D (cm) [1:0.283] [2:1.202] [N/A:N/A] A (cm) : rea [1:0.141] [2:0.12] [N/A:N/A] Volume (cm) : [1:-812.90%] [2:-752.50%] [N/A:N/A] % Reduction in A rea: [1:-642.10%] [2:-757.10%] [N/A:N/A] % Reduction in Volume: [1:Grade 2] [2:Grade 2] [N/A:N/A] Classification: [1:Medium] [2:Medium] [N/A:N/A] Exudate A mount: [1:Serosanguineous] [2:Serosanguineous] [N/A:N/A] Exudate Type: [1:red, brown] [2:red, brown] [N/A:N/A] Exudate Color: [1:Distinct, outline attached] [2:Well defined, not attached] [N/A:N/A] Wound Margin: [1:Medium (34-66%)] [2:Medium (34-66%)] [N/A:N/A] Granulation A mount: [1:Red] [2:Red] [N/A:N/A] Granulation Quality: [1:Small (1-33%)] [2:Medium (34-66%)] [N/A:N/A] Necrotic A mount: [1:Eschar, Adherent Slough] [2:Eschar, Adherent Slough] [N/A:N/A] Necrotic Tissue: [1:Fat Layer (Subcutaneous Tissue): Yes Fat Layer  (Subcutaneous Tissue): Yes N/A] Exposed Structures: [1:Bone: Yes Fascia: No Tendon: No Muscle: No Joint: No Small (1-33%)] [2:Fascia: No Tendon: No Muscle: No Joint: No Bone: No Small (1-33%)] [N/A:N/A] Epithelialization: [1:Callused Periwound] [2:Callused periwound] [N/A:N/A] Treatment Notes Electronic Signature(s) Signed: 09/07/2020 5:22:38 PM By: Linton Ham MD Signed: 09/07/2020 5:28:50 PM By: Levan Hurst RN, BSN Entered By: Linton Ham on 09/07/2020 11:01:49 -------------------------------------------------------------------------------- Multi-Disciplinary Care Plan Details Patient Name: Date of Service: Micheal Quails D. 09/07/2020 9:15 A M Medical Record Number: [id number] Patient Account Number: [id number] Date of Birth/Sex: Treating RN: 1935/10/25 (85 y.o. Micheal Walls Primary Care Anjulie Dipierro: [name] Other Clinician: Referring Jamile Rekowski: Treating Pike Scantlebury/Extender: Lynden Ang in Treatment: [place] reviewed with physician Active Inactive Wound/Skin Impairment Nursing Diagnoses: Impaired tissue integrity Knowledge deficit related to ulceration/compromised skin integrity Goals: Patient/caregiver will verbalize understanding of skin care regimen Date Initiated: 04/03/2020 Target Resolution Date: 10/09/2020 Goal Status: Active Ulcer/skin breakdown will have a volume reduction of 50% by week 8 Date Initiated: 04/03/2020 Date Inactivated: 05/15/2020 Target Resolution Date: 05/01/2020 Goal Status: Unmet Unmet Reason: offloading issue Ulcer/skin breakdown will have a volume reduction of 80% by week 12 Date Initiated: 05/15/2020 Date Inactivated: 09/07/2020 Target Resolution Date: 09/11/2020 Goal Status: Unmet Unmet Reason: Osteomyelitis Interventions: Assess patient/caregiver ability to obtain necessary supplies Assess patient/caregiver ability to perform ulcer/skin care regimen upon admission and as  needed Assess ulceration(s) every visit Provide education on ulcer and skin care Treatment Activities: Skin care regimen initiated : 04/03/2020 Topical wound management initiated : 04/03/2020 Notes: Electronic Signature(s) Signed: 09/07/2020 5:28:50 PM By: Levan Hurst RN, BSN Entered By: Levan Hurst on 09/07/2020 10:19:20 -------------------------------------------------------------------------------- Pain Assessment Details Patient Name: Date of Service: Micheal Quails D. 09/07/2020 9:15 A M Medical Record Number: [id number] Patient Account Number: [id number] Date of Birth/Sex: Treating RN: [birth year] (85 y.o. Micheal Walls Primary Care Makana Feigel: [name] Other Clinician: Referring Bryann Mcnealy: Treating Xayden Linsey/Extender: Lynden Ang in Treatment: 22 Active Problems Location of Pain Severity and Description of Pain Patient Has Paino No Site Locations Pain Management and Medication Current Pain Management: Electronic Signature(s) Signed: 09/07/2020 5:28:50 PM By: Levan Hurst RN, BSN Signed: 09/09/2020 9:05:34 AM By: Sandre Kitty Entered By: Sandre Kitty on 09/07/2020 10:00:53 -------------------------------------------------------------------------------- Patient/Caregiver Education Details Patient Name: Date of Service: Micheal Walls 3/28/2022andnbsp9:15 [place] Record Number: [id number] Patient Account Number: [id number] Date of Birth/Gender: Treating RN: Sep 25, 1935 (85 y.o. Micheal Walls Primary Care Physician: [name] Other Clinician: Referring Physician: Treating Physician/Extender: Lynden Ang in Treatment: 22 Education Assessment Education Provided To:  Patient Education Topics Provided Wound/Skin Impairment: Methods: Explain/Verbal Responses: State content correctly Electronic Signature(s) Signed: 09/07/2020 5:28:50 PM By: Levan Hurst RN, BSN Entered By: Levan Hurst on 09/07/2020 10:19:38 -------------------------------------------------------------------------------- Wound Assessment Details Patient Name: Date of Service: Micheal Quails D. 09/07/2020 9:15 A M Medical Record Number: [id number] Patient Account Number: [id number] Date of Birth/Sex: Treating RN: 1935/10/22 (85 y.o. Micheal Walls Primary Care Kaianna Dolezal: [name] Other Clinician: Referring Hogan Hoobler: Treating Kalisha Keadle/Extender: Lynden Ang in Treatment: 22 Wound Status Wound Number: 1 Primary Diabetic Wound/Ulcer of the Lower Extremity Etiology: Wound Location: Left T Great oe Wound Open Wounding Event: Blister Status: Date Acquired: 03/27/2020 Comorbid Cataracts, Anemia, Lymphedema, Sleep Apnea, Congestive Heart Weeks Of Treatment: 22 History: Failure, Hypertension, Peripheral Arterial Disease, Peripheral Venous Clustered Wound: No Disease, Type II Diabetes, End Stage Renal Disease, Gout, Neuropathy Wound Measurements Length: (cm) 0.6 Width: (cm) 0.6 Depth: (cm) 0.5 Area: (cm) 0.283 Volume: (cm) 0.141 % Reduction in Area: -812.9% % Reduction in Volume: -642.1% Epithelialization: Small (1-33%) Tunneling: No Undermining: No Wound Description Classification: Grade 2 Wound Margin: Distinct, outline attached Exudate Amount: Medium Exudate Type: Serosanguineous Exudate Color: red, brown Foul Odor After Cleansing: No Slough/Fibrino No Wound Bed Granulation Amount: Medium (34-66%) Exposed Structure Granulation Quality: Red Fascia Exposed: No Necrotic Amount: Small (1-33%) Fat Layer (Subcutaneous Tissue) Exposed: Yes Necrotic Quality: Eschar, Adherent Slough Tendon Exposed: No Muscle Exposed: No Joint Exposed: No Bone Exposed: Yes Assessment Notes Callused Periwound Treatment Notes Wound #1 (Toe Great) Wound Laterality: Left Cleanser Soap and Water Discharge Instruction: May shower and wash wound with dial  antibacterial soap and water prior to dressing change. Normal Saline Discharge Instruction: Cleanse the wound with Normal Saline or wound cleanser prior to applying a clean dressing using gauze sponges, not tissue or cotton balls. Peri-Wound Care Topical Primary Dressing Promogran Prisma Matrix, 4.34 (sq in) (silver collagen) Discharge Instruction: Moisten collagen with saline or hydrogel Secondary Dressing Woven Gauze Sponges 2x2 in Discharge Instruction: Apply over primary dressing as directed. Optifoam Non-Adhesive Dressing, 4x4 in Discharge Instruction: Use foam donut for offloading Secured With Conforming Stretch Gauze Bandage, Sterile 2x75 (in/in) Discharge Instruction: Secure with stretch gauze as directed. Paper Tape, 1x10 (in/yd) Discharge Instruction: Secure dressing with tape as directed. Compression Wrap Compression Stockings Add-Ons Electronic Signature(s) Signed: 09/07/2020 5:20:59 PM By: Lorrin Jackson Signed: 09/07/2020 5:28:50 PM By: Levan Hurst RN, BSN Entered By: Lorrin Jackson on 09/07/2020 10:22:45 -------------------------------------------------------------------------------- Wound Assessment Details Patient Name: Date of Service: Micheal Quails D. 09/07/2020 9:15 A M Medical Record Number: [id number] Patient Account Number: [id number] Date of Birth/Sex: Treating RN: [birth year] (84 y.o. Micheal Walls Primary Care Presly Steinruck: [name] Other Clinician: Referring Alp Goldwater: Treating Davell Beckstead/Extender: Lynden Ang in Treatment: 22 Wound Status Wound Number: 2 Primary Diabetic Wound/Ulcer of the Lower Extremity Etiology: Wound Location: Left, Plantar T Great oe Wound Open Wounding Event: Pressure Injury Status: Date Acquired: 07/14/2020 Comorbid Cataracts, Anemia, Lymphedema, Sleep Apnea, Congestive Heart Weeks Of Treatment: 7 History: Failure, Hypertension, Peripheral Arterial Disease, Peripheral Venous Clustered  Wound: No Disease, Type II Diabetes, End Stage Renal Disease, Gout, Neuropathy Photos Wound Measurements Length: (cm) 1.7 Width: (cm) 0.9 Depth: (cm) 0.1 Area: (cm) 1.202 Volume: (cm) 0.12 % Reduction in Area: -752.5% % Reduction in Volume: -757.1% Epithelialization: Small (1-33%) Tunneling: No Undermining: No Wound Description Classification: Grade 2 Wound Margin: Well defined, not attached Exudate Amount: Medium Exudate Type: Serosanguineous Exudate Color: red, brown Foul Odor After Cleansing: No Slough/Fibrino Yes  Wound Bed Granulation Amount: Medium (34-66%) Exposed Structure Granulation Quality: Red Fascia Exposed: No Necrotic Amount: Medium (34-66%) Fat Layer (Subcutaneous Tissue) Exposed: Yes Necrotic Quality: Eschar, Adherent Slough Tendon Exposed: No Muscle Exposed: No Joint Exposed: No Bone Exposed: No Assessment Notes Callused periwound Treatment Notes Wound #2 (Toe Great) Wound Laterality: Plantar, Left Cleanser Soap and Water Discharge Instruction: May shower and wash wound with dial antibacterial soap and water prior to dressing change. Normal Saline Discharge Instruction: Cleanse the wound with Normal Saline or wound cleanser prior to applying a clean dressing using gauze sponges, not tissue or cotton balls. Peri-Wound Care Topical Primary Dressing Promogran Prisma Matrix, 4.34 (sq in) (silver collagen) Discharge Instruction: Moisten collagen with saline or hydrogel Secondary Dressing Woven Gauze Sponges 2x2 in Discharge Instruction: Apply over primary dressing as directed. Optifoam Non-Adhesive Dressing, 4x4 in Discharge Instruction: Use foam donut for offloading Secured With Conforming Stretch Gauze Bandage, Sterile 2x75 (in/in) Discharge Instruction: Secure with stretch gauze as directed. Paper Tape, 1x10 (in/yd) Discharge Instruction: Secure dressing with tape as directed. Compression Wrap Compression Stockings Add-Ons Electronic  Signature(s) Signed: 09/07/2020 5:28:50 PM By: Levan Hurst RN, BSN Signed: 09/09/2020 9:05:34 AM By: Sandre Kitty Entered By: Sandre Kitty on 09/07/2020 17:01:55 -------------------------------------------------------------------------------- Vitals Details Patient Name: Date of Service: Micheal Quails D. 09/07/2020 9:15 A M Medical Record Number: [id number] Patient Account Number: [id number] Date of Birth/Sex: Treating RN: July 22, 1935 (85 y.o. Micheal Walls Primary Care Alliana Mcauliff: [name] Other Clinician: Referring Crisanto Nied: Treating Shatyra Becka/Extender: Lynden Ang in Treatment: 22 Vital Signs Time Taken: 10:00 Temperature (F): 97.9 Height (in): 73 Pulse (bpm): 66 Weight (lbs): 275 Respiratory Rate (breaths/min): 18 Body Mass Index (BMI): 36.3 Blood Pressure (mmHg): 97/54 Capillary Blood Glucose (mg/dl): 136 Reference Range: 80 - 120 mg / dl Electronic Signature(s) Signed: 09/09/2020 9:05:34 AM By: Sandre Kitty Entered By: Sandre Kitty on 09/07/2020 10:00:46

## 2020-09-09 NOTE — Patient Instructions (Signed)
Visit Information It was great speaking with you today!  Please let me know if you have any questions about our visit.  Goals Addressed            This Visit's Progress   . Manage My Medicine       Timeframe:  Long-Range Goal Priority:  High Start Date:                             Expected End Date:                       Follow Up Date 09/30/2020  - keep a list of all the medicines I take; vitamins and herbals too - use a pillbox to sort medicine - use an alarm clock or phone to remind me to take my medicine    Why is this important?   . These steps will help you keep on track with your medicines.        Patient Care Plan: Assist with Chronic Care Management and Care Coordination needs    Problem Identified: Assist with Chronic Care Management and Care Coordination needs   Priority: High    Goal: Assist with Chronic Care Management and Care Coordination needs   Start Date: 08/24/2020  Expected End Date: 10/25/2020  This Visit's Progress: On track  Priority: High  Note:   Current Barriers:  Marland Kitchen Knowledge Barriers related to resources and support available to address needs related to PAD, Cellulitis of left lower extremity, Neuropathy, Mixed hyperlipidemia, Type 2 diabetes mellitus  Case Manager Clinical Goal(s):  Marland Kitchen Over the next 60 days, patient will work with [name] to address needs related to disease education and support for chronic conditions including: PAD, Cellulitis of left lower extremity, Neuropathy, Mixed hyperlipidemia, Type 2 diabetes mellitus  Interventions:  . Collaborated with  embedded [name] Micheal Walls  to establish an individualized plan of care  Patient Self Care Activities:  . Patient will work with the CCM RN CM to address care coordination needs and will continue to work with the clinical team to address health care and disease management related needs.    Next Follow Up Date: 08/26/20    Patient Care Plan: Social Work [facility] Plan of Care    Problem  Identified: Mobility and Independence     Long-Range Goal: Mobility and Independence Optimized   Start Date: 08/24/2020  Expected End Date: 12/23/2020  This Visit's Progress: On track  Recent Progress: On track  Priority: High  Note:   Current Barriers:  . Chronic disease management support and education needs related to DM and PAD   . Limited access to caregiver . Unable to independently prepare meals  Social Worker Clinical Goal(s):  Marland Kitchen Over the next 120 days, patient will work with SW to identify and address any acute and/or chronic care coordination needs related to the self health management of DM and PAD   . Over the next 30 days the patient and his daughter will work with SW to identify caregiver resources for in the home . Over the next 10 days the patient will be placed on the wait list for mobile meals Goal Met  CCM SW Interventions: Completed with the [name] . Inter-disciplinary care team collaboration (see longitudinal plan of care) . Collaboration with Micheal Brine, [name] regarding development and update of comprehensive plan of care as evidenced by provider attestation and co-signature .  Collaboration with Micheal Walls SW with [facility] to place patient on the waiting list . Successful outbound call placed to the [name]  . Micheal Walls the patient has been placed on the wait list which is approximately 1 year - 18 months long . Encouraged Micheal Walls to have her local brother monitor mail as this program often sends a form quarterly to confirm the patient is still in need of the program . Performed chart review to note patients referral to [facility] has been accepted for the [employer] program . Micheal Walls a SW with this program will call in the coming months to screen for patient eligibility . Assessed for families plan surrounding care in the home  . Determined the family has a planned zoom call  for Sunday to discuss patient care needs and whether or not the family feels a private duty caregiver is needed . Scheduled follow call over the next 3 weeks to assist with ongoing care coordination needs . Encouraged Micheal Walls to contact SW as needed prior to next call  Patient Goals/Self-Care Activities . Over the next 30 days, patient will: With the help of his children  - Patient will self administer medications as prescribed Patient will attend all scheduled provider appointments Patient will call provider office for new concerns or questions Participate in family discussion regarding patient care needs Contact SW as needed prior to next scheduled call  Follow Up Plan:  SW will follow up with the patient over the next month    Patient Care Plan: CCM Pharmacy Care Plan    Problem Identified: HTN, HLD, DM II, GOUT, NEUROPATHY   Priority: High    Long-Range Goal: Disease Management   This Visit's Progress: On track  Priority: High  Note:     Current Barriers:  . Unable to independently monitor therapeutic efficacy . Unable to self administer medications as prescribed . Does not adhere to prescribed medication regimen   Pharmacist Clinical Goal(s):  Marland Kitchen Patient will achieve adherence to monitoring guidelines and medication adherence to achieve therapeutic efficacy . contact provider office for questions/concerns as evidenced notation of same in electronic health record through collaboration with PharmD and provider.    Interventions: . 1:1 collaboration with Micheal Brine, [name] regarding development and update of comprehensive plan of care as evidenced by provider attestation and co-signature . Inter-disciplinary care team collaboration (see longitudinal plan of care) . Comprehensive medication review performed; medication list updated in electronic medical record  Hypertension (BP goal <130/80) -Controlled -Current treatment: . Losartan 100 mg tablet once per day  o Patient  was told to take 1/2 a pill at bedtime but per daughter he does not remember why  -Current home readings: picked up BP cuff that was larger.  Checking every morning and it registers.  -Denies hypotensive/hypertensive symptoms -Educated on BP goals and benefits of medications for prevention of heart attack, stroke and kidney damage; Importance of home blood pressure monitoring; -Counseled to monitor BP at home at least once per day, document, and provide log at future appointments -Recommended to continue current medication  Hyperlipidemia: (LDL goal < 70) -Controlled -Current treatment: . Crestor 10 mg taking 1 tablet every 7 days on Wednesday o Patient needs his directions changed in system current number of pills are 114  . Aspirin 81 mg taking 1 tablet by mouth every day   -Educated on Cholesterol goals;  Benefits of statin for ASCVD risk reduction; Importance of limiting foods high  in cholesterol; Strategies to manage statin-induced myalgias; -Counseled on diet and exercise extensively Collaborated with PCP team to change medication to Atorvastatin 10 mg tablet on Monday, Wednesday and Friday.   Diabetes (A1c goal <7%) -Controlled -Current medications: . Ozempic 1 mg  - inject once a week on Wednesday  -Current home glucose readings  Patient has a CGM and his BS this morning is 114    At this time he does not allow his daughter access to the  numbers.  -Denies hypoglycemic/hyperglycemic symptoms -Current meal patterns: Will discuss at next visit  . drinks: Glucerna three times per day  -Current exercise: none noted  -Educated on A1c and blood sugar goals; Complications of diabetes including kidney damage, retinal damage, and cardiovascular disease; Prevention and management of hypoglycemic episodes; Benefits of routine self-monitoring of blood sugar; Continuous glucose monitoring; -Counseled to check feet daily and get yearly eye exams -Recommended to continue current  medication Counseled on the importance of CGM.   GOUT (Goal: prevent flare ups) -Controlled -Current treatment  . Allopurinol 100 mg tablet twice per day  . Colcrys 0.6 mg - as needed for gout flare up  -Counseled on diet and exercise extensively Recommended that patient continue to be careful with his diet.   Neuropathy (Goal: to prevent nerve pain) -Controlled -Current treatment  . Gabapentin 100 mg capsule: Two in the morning, 1 at midday and 1 at night.  -Recommended to continue current medication  Query BPH -Controlled -Current treatment  . Tamsulosin 0.4 mg capsule once per day  -Recommended to continue current medication Will collaborate with PCP and other providers to dertemine what medications patient should be currently taking for BPH.     Health Maintenance -Vaccine Therapy:  will discuss with patient getting zoster vaccine during next office visit.  -Current therapy:  . Ferrous Sulfate 325 (65 FE) - take 1 tablet twice per day on Monday, Wednesday and Friday and take one tablet all other days.  . Preservision  . Refresh Ophthalmic  . Colace 100 mg taking one capsule daily  -Educated on Herbal supplement research is limited and benefits usually cannot be proven Cost vs benefit of each product must be carefully weighed by individual consumer Supplements may interfere with prescription drugs -Patient is satisfied with current therapy and denies issues -Recommended to continue current medication   Patient Goals/Self-Care Activities . Patient will:  - take medications as prescribed focus on medication adherence by using a pill packaging system  check glucose using CGM system, document, and provide at future appointments  Follow Up Plan: Telephone follow up appointment with care management team member scheduled for: The patient has been provided with contact information for the care management team and has been advised to call with any health related questions or  concerns.       Mr. Maland was given information about Chronic Care Management services today including:  1. CCM service includes personalized support from designated clinical staff supervised by his physician, including individualized plan of care and coordination with other care providers 2. 24/7 contact phone numbers for assistance for urgent and routine care needs. 3. Standard insurance, coinsurance, copays and deductibles apply for chronic care management only during months in which we provide at least 20 minutes of these services. Most insurances cover these services at 100%, however patients may be responsible for any copay, coinsurance and/or deductible if applicable. This service may help you avoid the need for more expensive face-to-face services. 4. Only one practitioner may furnish  and bill the service in a calendar month. 5. The patient may stop CCM services at any time (effective at the end of the month) by phone call to the office staff.  Patient agreed to services and verbal consent obtained.   The patient verbalized understanding of instructions, educational materials, and care plan provided today and agreed to receive a mailed copy of patient instructions, educational materials, and care plan.   [name], PharmD Clinical Pharmacist [facility] [phone]

## 2020-09-11 ENCOUNTER — Other Ambulatory Visit: Payer: Self-pay

## 2020-09-11 ENCOUNTER — Inpatient Hospital Stay (HOSPITAL_COMMUNITY)
Admission: EM | Admit: 2020-09-11 | Discharge: 2020-09-16 | DRG: 256 | Disposition: A | Discharge status: Home Health | Payer: Medicare Other | Attending: Internal Medicine | Admitting: Internal Medicine

## 2020-09-11 ENCOUNTER — Telehealth: Payer: Self-pay | Admitting: Podiatry

## 2020-09-11 ENCOUNTER — Telehealth: Payer: Self-pay

## 2020-09-11 ENCOUNTER — Encounter (HOSPITAL_COMMUNITY): Payer: Self-pay | Admitting: *Deleted

## 2020-09-11 DIAGNOSIS — Z89511 Acquired absence of right leg below knee: Secondary | ICD-10-CM | POA: Diagnosis not present

## 2020-09-11 DIAGNOSIS — Z8249 Family history of ischemic heart disease and other diseases of the circulatory system: Secondary | ICD-10-CM | POA: Diagnosis not present

## 2020-09-11 DIAGNOSIS — I96 Gangrene, not elsewhere classified: Secondary | ICD-10-CM | POA: Diagnosis not present

## 2020-09-11 DIAGNOSIS — L97529 Non-pressure chronic ulcer of other part of left foot with unspecified severity: Secondary | ICD-10-CM | POA: Diagnosis not present

## 2020-09-11 DIAGNOSIS — Z87891 Personal history of nicotine dependence: Secondary | ICD-10-CM

## 2020-09-11 DIAGNOSIS — M109 Gout, unspecified: Secondary | ICD-10-CM | POA: Diagnosis not present

## 2020-09-11 DIAGNOSIS — Z881 Allergy status to other antibiotic agents status: Secondary | ICD-10-CM

## 2020-09-11 DIAGNOSIS — N1831 Chronic kidney disease, stage 3a: Secondary | ICD-10-CM | POA: Diagnosis not present

## 2020-09-11 DIAGNOSIS — Z794 Long term (current) use of insulin: Secondary | ICD-10-CM | POA: Diagnosis not present

## 2020-09-11 DIAGNOSIS — E1152 Type 2 diabetes mellitus with diabetic peripheral angiopathy with gangrene: Secondary | ICD-10-CM | POA: Diagnosis not present

## 2020-09-11 DIAGNOSIS — R5381 Other malaise: Secondary | ICD-10-CM | POA: Diagnosis not present

## 2020-09-11 DIAGNOSIS — E11621 Type 2 diabetes mellitus with foot ulcer: Secondary | ICD-10-CM

## 2020-09-11 DIAGNOSIS — M869 Osteomyelitis, unspecified: Secondary | ICD-10-CM | POA: Diagnosis not present

## 2020-09-11 DIAGNOSIS — E1169 Type 2 diabetes mellitus with other specified complication: Secondary | ICD-10-CM | POA: Diagnosis present

## 2020-09-11 DIAGNOSIS — Z20822 Contact with and (suspected) exposure to covid-19: Secondary | ICD-10-CM | POA: Diagnosis not present

## 2020-09-11 DIAGNOSIS — E785 Hyperlipidemia, unspecified: Secondary | ICD-10-CM | POA: Diagnosis present

## 2020-09-11 DIAGNOSIS — R69 Illness, unspecified: Secondary | ICD-10-CM | POA: Diagnosis not present

## 2020-09-11 DIAGNOSIS — E113593 Type 2 diabetes mellitus with proliferative diabetic retinopathy without macular edema, bilateral: Secondary | ICD-10-CM | POA: Diagnosis not present

## 2020-09-11 DIAGNOSIS — E113591 Type 2 diabetes mellitus with proliferative diabetic retinopathy without macular edema, right eye: Secondary | ICD-10-CM

## 2020-09-11 DIAGNOSIS — E1122 Type 2 diabetes mellitus with diabetic chronic kidney disease: Secondary | ICD-10-CM | POA: Diagnosis present

## 2020-09-11 DIAGNOSIS — L97522 Non-pressure chronic ulcer of other part of left foot with fat layer exposed: Secondary | ICD-10-CM | POA: Diagnosis not present

## 2020-09-11 DIAGNOSIS — I70262 Atherosclerosis of native arteries of extremities with gangrene, left leg: Secondary | ICD-10-CM | POA: Diagnosis not present

## 2020-09-11 DIAGNOSIS — S98112A Complete traumatic amputation of left great toe, initial encounter: Secondary | ICD-10-CM | POA: Diagnosis not present

## 2020-09-11 DIAGNOSIS — Z833 Family history of diabetes mellitus: Secondary | ICD-10-CM | POA: Diagnosis not present

## 2020-09-11 DIAGNOSIS — K219 Gastro-esophageal reflux disease without esophagitis: Secondary | ICD-10-CM | POA: Diagnosis present

## 2020-09-11 DIAGNOSIS — Z8673 Personal history of transient ischemic attack (TIA), and cerebral infarction without residual deficits: Secondary | ICD-10-CM | POA: Diagnosis not present

## 2020-09-11 DIAGNOSIS — L03032 Cellulitis of left toe: Secondary | ICD-10-CM | POA: Diagnosis not present

## 2020-09-11 DIAGNOSIS — I5032 Chronic diastolic (congestive) heart failure: Secondary | ICD-10-CM | POA: Diagnosis present

## 2020-09-11 DIAGNOSIS — I69398 Other sequelae of cerebral infarction: Secondary | ICD-10-CM | POA: Diagnosis not present

## 2020-09-11 DIAGNOSIS — G4733 Obstructive sleep apnea (adult) (pediatric): Secondary | ICD-10-CM | POA: Diagnosis present

## 2020-09-11 DIAGNOSIS — L02612 Cutaneous abscess of left foot: Secondary | ICD-10-CM | POA: Diagnosis not present

## 2020-09-11 DIAGNOSIS — I130 Hypertensive heart and chronic kidney disease with heart failure and stage 1 through stage 4 chronic kidney disease, or unspecified chronic kidney disease: Secondary | ICD-10-CM | POA: Diagnosis not present

## 2020-09-11 DIAGNOSIS — L03116 Cellulitis of left lower limb: Secondary | ICD-10-CM | POA: Diagnosis not present

## 2020-09-11 DIAGNOSIS — N179 Acute kidney failure, unspecified: Secondary | ICD-10-CM

## 2020-09-11 DIAGNOSIS — Z7982 Long term (current) use of aspirin: Secondary | ICD-10-CM

## 2020-09-11 DIAGNOSIS — D631 Anemia in chronic kidney disease: Secondary | ICD-10-CM | POA: Diagnosis present

## 2020-09-11 DIAGNOSIS — M86672 Other chronic osteomyelitis, left ankle and foot: Secondary | ICD-10-CM | POA: Diagnosis not present

## 2020-09-11 DIAGNOSIS — N183 Chronic kidney disease, stage 3 unspecified: Secondary | ICD-10-CM | POA: Diagnosis present

## 2020-09-11 DIAGNOSIS — M86172 Other acute osteomyelitis, left ankle and foot: Secondary | ICD-10-CM | POA: Diagnosis not present

## 2020-09-11 DIAGNOSIS — E113553 Type 2 diabetes mellitus with stable proliferative diabetic retinopathy, bilateral: Secondary | ICD-10-CM | POA: Diagnosis present

## 2020-09-11 DIAGNOSIS — R531 Weakness: Secondary | ICD-10-CM | POA: Diagnosis not present

## 2020-09-11 DIAGNOSIS — Z9889 Other specified postprocedural states: Secondary | ICD-10-CM

## 2020-09-11 DIAGNOSIS — M19072 Primary osteoarthritis, left ankle and foot: Secondary | ICD-10-CM | POA: Diagnosis not present

## 2020-09-11 DIAGNOSIS — I70245 Atherosclerosis of native arteries of left leg with ulceration of other part of foot: Secondary | ICD-10-CM | POA: Diagnosis not present

## 2020-09-11 DIAGNOSIS — M810 Age-related osteoporosis without current pathological fracture: Secondary | ICD-10-CM | POA: Diagnosis not present

## 2020-09-11 DIAGNOSIS — Z79899 Other long term (current) drug therapy: Secondary | ICD-10-CM

## 2020-09-11 DIAGNOSIS — I4510 Unspecified right bundle-branch block: Secondary | ICD-10-CM | POA: Diagnosis not present

## 2020-09-11 DIAGNOSIS — A419 Sepsis, unspecified organism: Secondary | ICD-10-CM | POA: Diagnosis not present

## 2020-09-11 DIAGNOSIS — M7732 Calcaneal spur, left foot: Secondary | ICD-10-CM | POA: Diagnosis not present

## 2020-09-11 LAB — CBC WITH DIFFERENTIAL/PLATELET
Abs Immature Granulocytes: 0.03 10*3/uL (ref 0.00–0.07)
Basophils Absolute: 0 10*3/uL (ref 0.0–0.1)
Basophils Relative: 0 %
Eosinophils Absolute: 0.2 10*3/uL (ref 0.0–0.5)
Eosinophils Relative: 2 %
HCT: 29 % — ABNORMAL LOW (ref 39.0–52.0)
Hemoglobin: 9.1 g/dL — ABNORMAL LOW (ref 13.0–17.0)
Immature Granulocytes: 0 %
Lymphocytes Relative: 15 %
Lymphs Abs: 1.4 10*3/uL (ref 0.7–4.0)
MCH: 29.6 pg (ref 26.0–34.0)
MCHC: 31.4 g/dL (ref 30.0–36.0)
MCV: 94.5 fL (ref 80.0–100.0)
Monocytes Absolute: 0.8 10*3/uL (ref 0.1–1.0)
Monocytes Relative: 9 %
Neutro Abs: 6.8 10*3/uL (ref 1.7–7.7)
Neutrophils Relative %: 74 %
Platelets: 249 10*3/uL (ref 150–400)
RBC: 3.07 MIL/uL — ABNORMAL LOW (ref 4.22–5.81)
RDW: 16.1 % — ABNORMAL HIGH (ref 11.5–15.5)
WBC: 9.2 10*3/uL (ref 4.0–10.5)
nRBC: 0 % (ref 0.0–0.2)

## 2020-09-11 LAB — BASIC METABOLIC PANEL
Anion gap: 8 (ref 5–15)
BUN: 76 mg/dL — ABNORMAL HIGH (ref 8–23)
CO2: 24 mmol/L (ref 22–32)
Calcium: 9.6 mg/dL (ref 8.9–10.3)
Chloride: 103 mmol/L (ref 98–111)
Creatinine, Ser: 2.84 mg/dL — ABNORMAL HIGH (ref 0.61–1.24)
GFR, Estimated: 21 mL/min — ABNORMAL LOW (ref >60)
Glucose, Bld: 116 mg/dL — ABNORMAL HIGH (ref 70–99)
Potassium: 4.5 mmol/L (ref 3.5–5.1)
Sodium: 135 mmol/L (ref 135–145)

## 2020-09-11 LAB — RESP PANEL BY RT-PCR (FLU A&B, COVID) ARPGX2
Influenza A by PCR: NEGATIVE
Influenza B by PCR: NEGATIVE
SARS Coronavirus 2 by RT PCR: NEGATIVE

## 2020-09-11 LAB — LACTIC ACID, PLASMA
Lactic Acid, Venous: 0.7 mmol/L (ref 0.5–1.9)
Lactic Acid, Venous: 0.9 mmol/L (ref 0.5–1.9)

## 2020-09-11 LAB — CBG MONITORING, ED: Glucose-Capillary: 102 mg/dL — ABNORMAL HIGH (ref 70–99)

## 2020-09-11 MED ORDER — FERROUS SULFATE 325 (65 FE) MG PO TABS
325.0000 mg | ORAL_TABLET | ORAL | Status: DC
  Administered 2020-09-14 – 2020-09-16 (×2): 325 mg via ORAL
  Filled 2020-09-11 (×2): qty 1

## 2020-09-11 MED ORDER — CLINDAMYCIN PHOSPHATE 600 MG/50ML IV SOLN
600.0000 mg | Freq: Once | INTRAVENOUS | Status: AC
  Administered 2020-09-11: 600 mg via INTRAVENOUS
  Filled 2020-09-11: qty 50

## 2020-09-11 MED ORDER — ENOXAPARIN SODIUM 40 MG/0.4ML ~~LOC~~ SOLN
40.0000 mg | SUBCUTANEOUS | Status: DC
  Administered 2020-09-11 – 2020-09-15 (×5): 40 mg via SUBCUTANEOUS
  Filled 2020-09-11 (×5): qty 0.4

## 2020-09-11 MED ORDER — PANTOPRAZOLE SODIUM 40 MG PO TBEC
40.0000 mg | DELAYED_RELEASE_TABLET | Freq: Every day | ORAL | Status: DC
  Administered 2020-09-12 – 2020-09-15 (×4): 40 mg via ORAL
  Filled 2020-09-11 (×4): qty 1

## 2020-09-11 MED ORDER — TAMSULOSIN HCL 0.4 MG PO CAPS
0.4000 mg | ORAL_CAPSULE | Freq: Every day | ORAL | Status: DC
  Administered 2020-09-12 – 2020-09-16 (×5): 0.4 mg via ORAL
  Filled 2020-09-11 (×5): qty 1

## 2020-09-11 MED ORDER — LACTATED RINGERS IV SOLN: INTRAVENOUS | Status: DC

## 2020-09-11 MED ORDER — ACETAMINOPHEN 650 MG RE SUPP: 650.0000 mg | Freq: Four times a day (QID) | RECTAL | Status: DC | PRN

## 2020-09-11 MED ORDER — ONDANSETRON HCL 4 MG PO TABS: 4.0000 mg | ORAL_TABLET | Freq: Four times a day (QID) | ORAL | Status: DC | PRN

## 2020-09-11 MED ORDER — LATANOPROST 0.005 % OP SOLN
1.0000 [drp] | Freq: Every day | OPHTHALMIC | Status: DC
  Filled 2020-09-11: qty 2.5

## 2020-09-11 MED ORDER — OMEGA-3-ACID ETHYL ESTERS 1 G PO CAPS
2.0000 g | ORAL_CAPSULE | Freq: Every day | ORAL | Status: DC
  Administered 2020-09-12 – 2020-09-16 (×5): 2 g via ORAL
  Filled 2020-09-11 (×5): qty 2

## 2020-09-11 MED ORDER — LINEZOLID 600 MG PO TABS
600.0000 mg | ORAL_TABLET | Freq: Two times a day (BID) | ORAL | Status: DC
  Administered 2020-09-11 – 2020-09-12 (×3): 600 mg via ORAL
  Filled 2020-09-11 (×4): qty 1

## 2020-09-11 MED ORDER — ADULT MULTIVITAMIN W/MINERALS CH
1.0000 | ORAL_TABLET | Freq: Every day | ORAL | Status: DC
  Administered 2020-09-12 – 2020-09-16 (×5): 1 via ORAL
  Filled 2020-09-11 (×5): qty 1

## 2020-09-11 MED ORDER — INSULIN ASPART 100 UNIT/ML ~~LOC~~ SOLN
0.0000 [IU] | Freq: Three times a day (TID) | SUBCUTANEOUS | Status: DC
  Administered 2020-09-15 – 2020-09-16 (×2): 1 [IU] via SUBCUTANEOUS

## 2020-09-11 MED ORDER — GABAPENTIN 100 MG PO CAPS
200.0000 mg | ORAL_CAPSULE | Freq: Every day | ORAL | Status: DC
Start: 2020-09-12 — End: 2020-09-16
  Administered 2020-09-12 – 2020-09-16 (×5): 200 mg via ORAL
  Filled 2020-09-11 (×5): qty 2

## 2020-09-11 MED ORDER — SODIUM CHLORIDE 0.9 % IV SOLN
2.0000 g | INTRAVENOUS | Status: DC
  Administered 2020-09-11 – 2020-09-14 (×4): 2 g via INTRAVENOUS
  Filled 2020-09-11 (×2): qty 20
  Filled 2020-09-11: qty 2
  Filled 2020-09-11: qty 20
  Filled 2020-09-11: qty 2

## 2020-09-11 MED ORDER — ALLOPURINOL 100 MG PO TABS
100.0000 mg | ORAL_TABLET | Freq: Every day | ORAL | Status: DC
  Administered 2020-09-12 – 2020-09-16 (×5): 100 mg via ORAL
  Filled 2020-09-11 (×5): qty 1

## 2020-09-11 MED ORDER — POLYVINYL ALCOHOL 1.4 % OP SOLN: OPHTHALMIC | Status: DC | PRN

## 2020-09-11 MED ORDER — METRONIDAZOLE 500 MG PO TABS
500.0000 mg | ORAL_TABLET | Freq: Three times a day (TID) | ORAL | Status: DC
  Administered 2020-09-11 – 2020-09-13 (×5): 500 mg via ORAL
  Filled 2020-09-11 (×5): qty 1

## 2020-09-11 MED ORDER — DORZOLAMIDE HCL 2 % OP SOLN
1.0000 [drp] | Freq: Every day | OPHTHALMIC | Status: DC
  Filled 2020-09-11: qty 10

## 2020-09-11 MED ORDER — ONDANSETRON HCL 4 MG/2ML IJ SOLN: 4.0000 mg | Freq: Four times a day (QID) | INTRAMUSCULAR | Status: DC | PRN

## 2020-09-11 MED ORDER — GABAPENTIN 100 MG PO CAPS
100.0000 mg | ORAL_CAPSULE | ORAL | Status: DC
  Administered 2020-09-12 – 2020-09-15 (×8): 100 mg via ORAL
  Filled 2020-09-11 (×8): qty 1

## 2020-09-11 MED ORDER — ACETAMINOPHEN 325 MG PO TABS: 650.0000 mg | ORAL_TABLET | Freq: Four times a day (QID) | ORAL | Status: DC | PRN

## 2020-09-11 MED ORDER — CLOPIDOGREL BISULFATE 75 MG PO TABS
75.0000 mg | ORAL_TABLET | Freq: Every day | ORAL | Status: DC
Start: 2020-09-12 — End: 2020-09-16
  Administered 2020-09-12 – 2020-09-16 (×5): 75 mg via ORAL
  Filled 2020-09-11 (×5): qty 1

## 2020-09-11 MED ORDER — ROSUVASTATIN CALCIUM 5 MG PO TABS: 10.0000 mg | ORAL_TABLET | ORAL | Status: DC

## 2020-09-11 MED ORDER — ASPIRIN EC 81 MG PO TBEC
81.0000 mg | DELAYED_RELEASE_TABLET | Freq: Every day | ORAL | Status: DC
  Administered 2020-09-12 – 2020-09-16 (×5): 81 mg via ORAL
  Filled 2020-09-11 (×5): qty 1

## 2020-09-11 MED ORDER — DILTIAZEM HCL ER COATED BEADS 180 MG PO CP24
300.0000 mg | ORAL_CAPSULE | Freq: Every day | ORAL | Status: DC
  Administered 2020-09-12 – 2020-09-16 (×5): 300 mg via ORAL
  Filled 2020-09-11 (×5): qty 1

## 2020-09-11 NOTE — Telephone Encounter (Signed)
The following patient has some concerning issues with the left foot, great toe. The toe is more necrotic and the wound is deeper, redness on the bottom of the foot with swelling and ring around base of toe. The home health nurse called in to see if changes can be made to treatment plan to speed up surgery plans, Please   Contact info  Alyse Low Reel [phone]

## 2020-09-11 NOTE — ED Notes (Signed)
Vascular at bedside

## 2020-09-11 NOTE — Telephone Encounter (Signed)
Ok, thanks.

## 2020-09-11 NOTE — Telephone Encounter (Signed)
I'll evaluate him at his appointment next week

## 2020-09-11 NOTE — ED Triage Notes (Signed)
PT here via [facility] from home for diabetic wound to L great toe that is not healing.  Pt has seen wound care team for several months, but wound only gets worse.

## 2020-09-11 NOTE — Telephone Encounter (Signed)
Judeth [facility] nurse called to let us know she is with pt and his foot is more swollen than earlier in the week and toe looks worse with darker necrotic tissue and a ring around the toe, with warmth and redness. I have read her the note from his office visit last week and he was going to discuss amputation with his daughter and call us to schedule it. Alyse Low is going to review this with pt and will call us back.

## 2020-09-11 NOTE — Consult Note (Addendum)
Hospital Consult    Reason for Consult: Left great toe ulceration Referring Physician: ED MRN #:  [id number]  History of Present Illness: This is a 85 y.o. male with history of peripheral arterial disease, diabetes, congestive heart failure, CKD that vascular surgery has been consulted for left great toe ulceration.  He was recently seen in consultation by Dr. Randie Heinz for left big toe osteomyelitis and then underwent left leg arteriogram with intervention on 08/11/2020 by Dr. Myra Gianotti.  Ultimately had a left anterior tibial artery angioplasty and peroneal was patent and the posterior tibial was occluded.  Ultimately patient states he has been in discussion with podiatry about a left great toe amputation.  He is scheduled to see Dr. Lilian Kapur on Tuesday to discuss left great toe amputation.  His home health wound nurse advised him to come to the emergency department due to worsening wound.  Denies fevers or chills.  He does feel the toe looks worse.  Past Medical History:  Diagnosis Date  . Acute metabolic encephalopathy 01/16/2020  . Amputated below knee (HCC)    right  . Anemia   . Aspiration pneumonia (HCC) 11/02/2019  . Diastolic heart failure (HCC)   . Diverticulosis   . DM (diabetes mellitus) (HCC)   . Gout   . Hyperlipemia   . OSA on CPAP   . RBBB   . Sepsis (HCC) 11/02/2019  . Systemic hypertension     Past Surgical History:  Procedure Laterality Date  . ABDOMINAL AORTOGRAM W/LOWER EXTREMITY Left 08/11/2020   Procedure: ABDOMINAL AORTOGRAM W/LOWER EXTREMITY;  Surgeon: Nada Libman, MD;  Location: [facility];  Service: Cardiovascular;  Laterality: Left;  . LEG AMPUTATION BELOW KNEE     right  . NM MYOCAR PERF WALL MOTION  09/18/2009   no ischemia  . PERIPHERAL VASCULAR BALLOON ANGIOPLASTY Left 08/11/2020   Procedure: PERIPHERAL VASCULAR BALLOON ANGIOPLASTY;  Surgeon: Nada Libman, MD;  Location: [facility];  Service: Cardiovascular;  Laterality: Left;  AT     Allergies  Allergen Reactions  . Vancomycin Rash    Prior to Admission medications   Medication Sig Start Date End Date Taking? Authorizing Provider  aspirin EC 81 MG tablet Take 81 mg by mouth daily. Swallow whole.    [provider]  Robyne Askew Paint Modified 1.5 % LIQD Apply between toes as needed for maceration. 08/08/20   Freddie Breech, DPM  cephALEXin (KEFLEX) 500 MG capsule Take 1 capsule (500 mg total) by mouth 2 (two) times daily. 09/04/20   Rhyne, Ames Coupe, PA-C  colchicine 0.6 MG tablet Take 0.6 mg by mouth daily as needed (gout flare up).    [provider]  Continuous Blood Gluc Sensor (FREESTYLE LIBRE 14 DAY SENSOR) MISC Use as directed to check blood sugars 05/13/20   [name], [name]  ferrous sulfate 325 (65 FE) MG tablet Take 325 mg by mouth every Monday, Wednesday, and Friday.    [provider]  fish oil-omega-3 fatty acids 1000 MG capsule Take 2 g by mouth daily.    [provider]  furosemide (LASIX) 40 MG tablet Take 2 tabs in morning and 1 tab evening Patient taking differently: Take 40-80 mg by mouth 2 (two) times daily. 80 mg in the morning and 40 mg at night 01/23/20   [name], [name]  gabapentin (NEURONTIN) 100 MG capsule Take 2 capsules in am 1 capsule mid day and 1 capsule in evening 08/17/20   [name], [name]  glucose blood (FREESTYLE PRECISION NEO TEST) test strip Use as instructed 07/30/19   [name], [name]  latanoprost (XALATAN) 0.005 % ophthalmic solution Place 1 drop into both eyes at bedtime.  10/12/18   [provider]  losartan (COZAAR) 100 MG tablet Take 100 mg by mouth daily.    [provider]  Multiple Vitamin (MULTIVITAMIN WITH MINERALS) TABS tablet Take 1 tablet by mouth daily.    [provider]  pantoprazole (PROTONIX) 40 MG tablet Take 1 tablet  by mouth daily. Patient taking differently: Take 40 mg by mouth at bedtime. 07/15/20   [name], [name]  Polyvinyl  Alcohol-Povidone (REFRESH OP) Place 1 drop into both eyes daily as needed (dry eyes).    [provider]  rosuvastatin (CRESTOR) 10 MG tablet Take 1 tablet (10 mg total) by mouth daily. Patient taking differently: Take 10 mg by mouth every 7 (seven) days. Wednesday of each week 11/07/19   Bonnielee Haff, MD  Semaglutide, [name], (OZEMPIC, [name],) 2 MG/1.5ML SOPN Inject 1 mg into the skin once a week. Patient taking differently: Inject 1 mg into the skin every Wednesday. 09/24/19   [name], [name]  tamsulosin (FLOMAX) 0.4 MG CAPS capsule Take 0.4 mg by mouth daily.    [provider]    Social History   Socioeconomic History  . Marital status: Married    Spouse name: Not on file  . Number of children: Not on file  . Years of education: Not on file  . Highest education level: Not on file  Occupational History  . Occupation: retired  Tobacco Use  . Smoking status: Former Smoker    Types: Cigars    Quit date: 06/12/2001    Years since quitting: 19.2  . Smokeless tobacco: Never Used  Vaping Use  . Vaping Use: Former  Substance and Sexual Activity  . Alcohol use: No    Alcohol/week: 0.0 standard drinks  . Drug use: No  . Sexual activity: Not Currently  Other Topics Concern  . Not on file  Social History Narrative  . Not on file   Social Determinants of Health   Financial Resource Strain: Low Risk   . Difficulty of Paying Living Expenses: Not hard at all  Food Insecurity: No Food Insecurity  . Worried About Charity fundraiser in the Last Year: Never true  . Ran Out of Food in the Last Year: Never true  Transportation Needs: No Transportation Needs  . Lack of Transportation (Medical): No  . Lack of Transportation (Non-Medical): No  Physical Activity: Inactive  . Days of Exercise per Week: 0 days  . Minutes of Exercise per Session: 0 min  Stress: No Stress Concern Present  . Feeling of Stress : Not at all  Social Connections: Not on file   Intimate Partner Violence: Not on file     Family History  Problem Relation Age of Onset  . Diabetes Mother   . Heart attack Father   . Cancer Sister     ROS: [x]  Positive   [ ]  Negative   [ ]  All sytems reviewed and are negative  Cardiovascular: []  chest pain/pressure []  palpitations []  SOB lying flat []  DOE []  pain in legs while walking []  pain in legs at rest []  pain in legs at night []  non-healing ulcers []  hx of DVT []  swelling in legs  Pulmonary: []  productive cough []  asthma/wheezing []  home O2  Neurologic: []  weakness in []  arms []  legs []   numbness in $RemoveBef'[]'JXOPqUYTZu$  arms $Rem'[]'vgTU$  legs $Rem'[]'crZj$  hx of CVA $Remo'[]'aakaf$  mini stroke $RemoveBef'[]'Ehwfqknesa$ difficulty speaking or slurred speech $RemoveBefore'[]'TqYyjeSadAPvz$  temporary loss of vision in one eye $Remov'[]'aUNQhb$  dizziness  Hematologic: $RemoveBefor'[]'TiMnWkBmpSce$  hx of cancer $RemoveB'[]'odVbCPhy$  bleeding problems $RemoveBeforeDEI'[]'ivLovQkwuHvpdtTE$  problems with blood clotting easily  Endocrine:   '[]'$  diabetes $RemoveB'[]'CmgrGgYL$  thyroid disease  GI $R'[]'Hg$  vomiting blood $RemoveBefore'[]'TtOsruUJZxhze$  blood in stool  GU: $Re'[]'wYw$  CKD/renal failure $RemoveBeforeDEI'[]'wLXdBzpDdJRZteFy$  HD'[]'$  M/W/F or $Remove'[]'yOdrTQw$  T/T/S $Remo'[]'znKTb$  burning with urination $RemoveBefore'[]'MSSPzXoDiFWBX$  blood in urine  Psychiatric: $RemoveBefor'[]'qnuNPwMReZDU$  anxiety $Remove'[]'DdXjwfQ$  depression  Musculoskeletal: $RemoveBeforeDEI'[]'nSmbeGPTpRNfpifn$  arthritis $RemoveBe'[]'RXdZOBuwm$  joint pain  Integumentary: $RemoveBeforeD'[]'WvINbVDYHjGbuf$  rashes $Remov'[]'EkiODW$  ulcers  Constitutional: $RemoveBeforeDE'[]'pzJodoGPmtJsoRv$  fever $Remo'[]'pdhXn$  chills   Physical Examination  Vitals:   09/11/20 1538 09/11/20 1539  BP: 123/71 123/71  Pulse: 86 86  Resp: 18 18  Temp: 98.3 F (36.8 C) 98.3 F (36.8 C)  SpO2: 96% 96%   There is no height or weight on file to calculate BMI.  General:  NAD Gait: Not observed HENT: WNL, normocephalic Pulmonary: no respiratory distress Cardiac: regular, without  Murmurs, rubs or gallops Abdomen: soft, NT/ND Vascular Exam/Pulses: Left femoral pulse palpable Left DP and peroneal monophasic signals Ischemic changes with open wound to the left great toe as pictured below with some cellulitis that extends onto dorsum of foot Venous changes to left leg Right BKA Musculoskeletal: no muscle wasting or atrophy  Neurologic: A&O X 3;  Appropriate Affect ; SENSATION: normal; MOTOR FUNCTION:  moving all extremities equally. Speech is fluent/normal         CBC    Component Value Date/Time   WBC 5.7 08/27/2020 1525   RBC 3.18 (L) 08/27/2020 1525   HGB 9.9 (L) 08/27/2020 1525   HGB 9.6 (L) 08/17/2020 1413   HCT 29.0 (L) 08/27/2020 1525   HCT 29.3 (L) 08/17/2020 1413   PLT 224 08/27/2020 1525   PLT 135 (L) 08/17/2020 1413   MCV 91.2 08/27/2020 1525   MCV 93 08/17/2020 1413   MCH 31.1 08/27/2020 1525   MCHC 34.1 08/27/2020 1525   RDW 15.8 (H) 08/27/2020 1525   RDW 16.3 (H) 08/17/2020 1413   LYMPHSABS 0.6 (L) 08/09/2020 0858   LYMPHSABS 1.8 03/15/2018 1022   MONOABS 1.0 08/09/2020 0858   EOSABS 0.0 08/09/2020 0858   EOSABS 0.2 03/15/2018 1022   BASOSABS 0.0 08/09/2020 0858   BASOSABS 0.0 03/15/2018 1022    BMET    Component Value Date/Time   NA 136 08/27/2020 1525   NA 137 08/17/2020 1413   K 4.9 08/27/2020 1525   CL 100 08/27/2020 1525   CO2 28 08/27/2020 1525   GLUCOSE 105 (H) 08/27/2020 1525   BUN 64 (H) 08/27/2020 1525   BUN 57 (H) 08/17/2020 1413   CREATININE 1.85 (H) 08/27/2020 1525   CALCIUM 10.4 (H) 08/27/2020 1525   GFRNONAA 58 (L) 08/13/2020 0505   GFRAA 42 (L) 05/20/2020 0951    COAGS: Lab Results  Component Value Date   INR 1.2 08/09/2020   INR 1.2 07/18/2020   INR 1.1 01/17/2020     Non-Invasive Vascular Imaging:    No new imaging tonight.   ASSESSMENT/PLAN: This is a 85 y.o. male with multiple medical co-morbidities that presents with worsening left great toe ulceration.  He is status post left anterior tibial angioplasty by Dr. Trula Slade on 08/11/2020 in the setting of severe tibial disease.  I reviewed his arteriogram images and these were excellent results and he now has two-vessel runoff in the left leg with a patent peroneal and anterior tibial artery.  I  think he is optimized from a vascular surgery standpoint.  He does have DP and peroneal signals on exam.  I think he needs  to be admitted for IV antibiotics.  He has been seen by Dr. Sherryle Lis with podiatry and states he has a appointment on Tuesday to discuss left great toe amputation with Dr. Sherryle Lis.  Would be reasonable to consult podiatry here to discuss left great toe amputation.  Certainly risk this does not heal and ultimately requires more proximal amputation.  I have updated the patient's daughter at bedside in the ED as well via Mr. Minkin's cell phone.    Marty Heck, MD [facility] Office: [place]

## 2020-09-11 NOTE — ED Provider Notes (Signed)
[facility] EMERGENCY DEPARTMENT Provider Note   CSN: [id number] Arrival date & time: 09/11/20  1538     History Chief Complaint  Patient presents with  . Foot Injury    Micheal Walls is a 85 y.o. male.  The history is provided by the patient and medical records. No language interpreter was used.  Foot Injury    85 year old male significant history of PAD, diabetes, hypertension, CHF, gout, presented complaining of non healing wound in his left toe. Pt previously diagnosed with left big toe osteomyelitis and been taking doxycycline and Augmentin as well as wound care since 07/22/2020.  5 days ago, his antibiotic was switched to Keflex twice daily.  He has been taking the medication however family immediately noticed that wound became more odorous as well as increased redness.  His wound care specialist came out to evaluate him today and was concerned and recommended to come to the ER for evaluation.  Patient otherwise states he does not have any other symptoms he denies having fever chills or worsening pain.  He is up-to-date with COVID vaccination.  Past Medical History:  Diagnosis Date  . Acute metabolic encephalopathy 09/14/1538  . Amputated below knee ([place])    right  . Anemia   . Aspiration pneumonia ([place]) 11/02/2019  . Diastolic heart failure ([place])   . Diverticulosis   . DM (diabetes mellitus) ([place])   . Gout   . Hyperlipemia   . OSA on CPAP   . RBBB   . Sepsis ([place]) 11/02/2019  . Systemic hypertension     Patient Active Problem List   Diagnosis Date Noted  . 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[place])   . Left ankle swelling   . Diabetic foot infection ([place])   . Cerebral thrombosis with cerebral infarction 07/20/2020  . Thrombocytopenia ([place]) 07/19/2020  . Cerebral ischemia 07/18/2020  . Bilateral hearing loss 05/20/2020  . Does use hearing aid 05/20/2020  . Diabetic ulcer of left great toe ([place]) 01/16/2020  . 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) 01/06/2020  . Intermediate stage nonexudative age-related macular degeneration of both eyes 01/06/2020  . Left epiretinal membrane 01/06/2020  . [place] of right macula 01/06/2020  . AKI (acute kidney injury) ([place]) 11/02/2019  . Cellulitis 11/02/2019  . Elevated TSH 07/10/2018  . Nephropathy 02/28/2018  . Disturbance of skin sensation 02/28/2018  . CKD (chronic kidney disease) stage 4, GFR 15-29 ml/min (HCC) 07/17/2017  . PAD (peripheral artery disease) ([place]) 07/17/2017  . 110.1 10/21/2013  . Epiphora 04/24/2013  . Laxity of eyelid 04/24/2013  . Anemia 04/09/2013  . History of peripheral vascular disease 04/09/2013  . History of stroke 04/09/2013  . S/P unilateral BKA (below knee amputation) ([place]) 03/17/2013  . Diabetes mellitus type 2 in obese ([place]) 03/17/2013  . Mixed hyperlipidemia 03/17/2013  . Essential hypertension 03/17/2013  . Gout 03/17/2013  . CKD (chronic kidney disease), stage III ([place]) 03/17/2013  . OSA on CPAP 03/17/2013  . Anemia, iron deficiency 03/17/2013  . Diverticulosis of colon 03/17/2013  . RBBB 03/17/2013  . Punctal stenosis, acquired 10/02/2012    Past Surgical History:  Procedure Laterality Date  . ABDOMINAL AORTOGRAM W/LOWER EXTREMITY Left 08/11/2020   Procedure: ABDOMINAL AORTOGRAM W/LOWER EXTREMITY;  Surgeon: Serafina Mitchell, MD;  Location: [facility];  Service: Cardiovascular;  Laterality: Left;  . LEG AMPUTATION BELOW KNEE     right  . NM  MYOCAR PERF WALL MOTION  09/18/2009   no ischemia  . PERIPHERAL VASCULAR BALLOON ANGIOPLASTY Left 08/11/2020   Procedure: PERIPHERAL VASCULAR BALLOON ANGIOPLASTY;  Surgeon: Serafina Mitchell, MD;  Location: [facility];  Service: Cardiovascular;  Laterality: Left;  AT       Family History  Problem Relation Age of Onset  . Diabetes Mother   . Heart attack Father   . Cancer Sister     Social History   Tobacco  Use  . Smoking status: Former Smoker    Types: Cigars    Quit date: 06/12/2001    Years since quitting: 19.2  . Smokeless tobacco: Never Used  Vaping Use  . Vaping Use: Former  Substance Use Topics  . Alcohol use: No    Alcohol/week: 0.0 standard drinks  . Drug use: No    Home Medications Prior to Admission medications   Medication Sig Start Date End Date Taking? Authorizing Provider  aspirin EC 81 MG tablet Take 81 mg by mouth daily. Swallow whole.    [provider]  [name] Apply between toes as needed for maceration. 08/08/20   [name], DPM  cephALEXin (KEFLEX) 500 MG capsule Take 1 capsule (500 mg total) by mouth 2 (two) times daily. 09/04/20   Rhyne, Hulen Shouts, PA-C  colchicine 0.6 MG tablet Take 0.6 mg by mouth daily as needed (gout flare up).    [provider]  Continuous Blood Gluc Sensor (FREESTYLE LIBRE 14 DAY SENSOR) MISC Use as directed to check blood sugars 05/13/20   [name], [name]  ferrous sulfate 325 (65 FE) MG tablet Take 325 mg by mouth every Monday, Wednesday, and Friday.    [provider]  fish oil-omega-3 fatty acids 1000 MG capsule Take 2 g by mouth daily.    [provider]  furosemide (LASIX) 40 MG tablet Take 2 tabs in morning and 1 tab evening Patient taking differently: Take 40-80 mg by mouth 2 (two) times daily. 80 mg in the morning and 40 mg at night 01/23/20   [name], [name]  gabapentin (NEURONTIN) 100 MG capsule Take 2 capsules in am 1 capsule mid day and 1 capsule in evening 08/17/20   [name], [name]  glucose blood (FREESTYLE PRECISION NEO TEST) test strip Use as instructed 07/30/19   [name], [name]  latanoprost (XALATAN) 0.005 % ophthalmic solution Place 1 drop into both eyes at bedtime.  10/12/18   [provider]  losartan (COZAAR) 100 MG tablet Take 100 mg by mouth daily.    [provider]  Multiple Vitamin (MULTIVITAMIN WITH MINERALS) TABS  tablet Take 1 tablet by mouth daily.    [provider]  pantoprazole (PROTONIX) 40 MG tablet Take 1 tablet  by mouth daily. Patient taking differently: Take 40 mg by mouth at bedtime. 07/15/20   [name], [name]  Polyvinyl Alcohol-Povidone (REFRESH OP) Place 1 drop into both eyes daily as needed (dry eyes).    [provider]  rosuvastatin (CRESTOR) 10 MG tablet Take 1 tablet (10 mg total) by mouth daily. Patient taking differently: Take 10 mg by mouth every 7 (seven) days. Wednesday of each week 11/07/19   Bonnielee Haff, MD  Semaglutide, [name], (OZEMPIC, [name],) 2 MG/1.5ML SOPN Inject 1 mg into the skin once a week. Patient taking differently: Inject 1 mg into the skin every Wednesday. 09/24/19   [name], [name]  tamsulosin (FLOMAX) 0.4 MG CAPS capsule Take  0.4 mg by mouth daily.    [provider]    Allergies    Vancomycin  Review of Systems   Review of Systems  All other systems reviewed and are negative.   Physical Exam Updated Vital Signs BP 123/71 (BP Location: Left Arm)   Pulse 86   Temp 98.3 F (36.8 C) (Oral)   Resp 18   SpO2 96%   Physical Exam Vitals and nursing note reviewed.  Constitutional:      General: He is not in acute distress.    Appearance: He is well-developed.  HENT:     Head: Atraumatic.  Eyes:     Conjunctiva/sclera: Conjunctivae normal.  Cardiovascular:     Rate and Rhythm: Normal rate and regular rhythm.     Pulses: Normal pulses.     Heart sounds: Normal heart sounds.  Pulmonary:     Effort: Pulmonary effort is normal.     Breath sounds: Normal breath sounds.  Musculoskeletal:     Cervical back: Neck supple.  Skin:    Findings: No rash.     Comments: Left great toe: There is an ulceration noted to the medial aspect of the toe with foul odor and surrounding skin erythema to the base of the toe.  Necrotic skin changes noted to the tip of the toe.  Sign of Onychomycosis involving the nail.  Intact DP  pulse.     Neurological:     Mental Status: He is alert.     ED Results / Procedures / Treatments   Labs (all labs ordered are listed, but only abnormal results are displayed) Labs Reviewed  BASIC METABOLIC PANEL - Abnormal; Notable for the following components:      Result Value   Glucose, Bld 116 (*)    BUN 76 (*)    Creatinine, Ser 2.84 (*)    GFR, Estimated 21 (*)    All other components within normal limits  CBC WITH DIFFERENTIAL/PLATELET - Abnormal; Notable for the following components:   RBC 3.07 (*)    Hemoglobin 9.1 (*)    HCT 29.0 (*)    RDW 16.1 (*)    All other components within normal limits  CULTURE, BLOOD (ROUTINE X 2)  CULTURE, BLOOD (ROUTINE X 2)  RESP PANEL BY RT-PCR (FLU A&B, COVID) ARPGX2  LACTIC ACID, PLASMA  LACTIC ACID, PLASMA    EKG None  Radiology No results found.  Procedures Procedures   Medications Ordered in ED Medications  clindamycin (CLEOCIN) IVPB 600 mg (600 mg Intravenous New Bag/Given 09/11/20 1901)    ED Course  I have reviewed the triage vital signs and the nursing notes.  Pertinent labs & imaging results that were available during my care of the patient were reviewed by me and considered in my medical decision making (see chart for details).    MDM Rules/Calculators/A&P                          BP 123/71 (BP Location: Left Arm)   Pulse 86   Temp 98.3 F (36.8 C) (Oral)   Resp 18   SpO2 96%   Final Clinical Impression(s) / ED Diagnoses Final diagnoses:  Cellulitis of great toe of left foot    Rx / DC Orders ED Discharge Orders    None     4:34 PM Patient has a chronic nonhealing ulcer involving his left great toe with osteomyelitis.  It appears that the wound was improving  with Augmentin and doxycycline and once it was switched to Keflex, the wound festered.  Patient sent here at the recommendations of the wound care specialist due to concerns of worsening infection.  My plan is to consult vascular surgeon for  recommendation.  Suspect pt likely may benefit toe amputation.   6:40 PM Appreciate consultation from vascular surgeon Dr. Carlis Abbott who has seen evaluate patient.  He does agree with hospital admission for treatments of toe infection.  He mentioned that patient is currently being cared for by podiatry and should be consulted and managed by them.  Will consult medicine for admission once labs are available.  Patient will be given clindamycin for infection.  7:26 PM Labs remarkable for worsening renal function with a creatinine of 2.84.  It was 1.85 approximately 2 weeks ago.  I suspect this is likely medication induced.  Normal WBC, normal lactic acid.  Appreciate consultation from [facility] hospitalist Dr. Alcario Drought who agrees to see and admit patient for further management.  Patient is currently receiving clindamycin for infected diabetic foot ulcer.   Domenic Moras, PA-C 09/11/20 1929    Malvin Johns, MD 09/11/20 2010

## 2020-09-11 NOTE — H&P (Addendum)
History and Physical    Micheal Walls:[id number] DOB: [birth year] DOA: 09/11/2020  PCP: [name], [name]  Patient coming from: Home  I have personally briefly reviewed patient's old medical records in [place]  Chief Complaint: Toe infectino  HPI: Micheal Walls is a 85 y.o. male with medical history significant of DM2, R BKA, OSA, HTN.  Pt has been battling with L great toe diabetic ulcer and infection for past couple of months.  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  Pt admitted to hospital 2/27-3/3 with sepsis.  Ultimately discharged on Zyovx augmentin until 3/9 followed by doxycycline / augmentin until 3/17.  Infection had been somewhat improved on ABx, but on Friday pt put on Keflex by wound care.  (S. Ludgenesis resistant to Keflex).  Family noticed that wound became more odorous and pt had increased erythema to wound.  Wound care specialist evaluated pt and recommended he come to ED.  No fevers, chills, worsening pain, weakness.   ED Course: No SIRS.  Creat 2.84 today up significantly from his discharge creat on 3/3 of 1.24.  Vasc surgery consulted.  Pt given dose of clinda.  Hospitalist asked to admit for IV ABx.   Review of Systems: As per HPI, otherwise all review of systems negative.  Past Medical History:  Diagnosis Date  . Acute metabolic encephalopathy 10/15/5623  . Amputated below knee ([place])    right  . Anemia   . Aspiration pneumonia ([place]) 11/02/2019  . Diastolic heart failure ([place])   . Diverticulosis   . DM (diabetes mellitus) ([place])   . Gout   . Hyperlipemia   . OSA on CPAP   . RBBB   . Sepsis ([place]) 11/02/2019  . Systemic hypertension     Past Surgical History:  Procedure Laterality Date  . ABDOMINAL AORTOGRAM W/LOWER EXTREMITY Left 08/11/2020   Procedure: ABDOMINAL AORTOGRAM W/LOWER EXTREMITY;  Surgeon: Serafina Mitchell, MD;  Location: [facility];  Service: Cardiovascular;  Laterality: Left;  . LEG AMPUTATION  BELOW KNEE     right  . NM MYOCAR PERF WALL MOTION  09/18/2009   no ischemia  . PERIPHERAL VASCULAR BALLOON ANGIOPLASTY Left 08/11/2020   Procedure: PERIPHERAL VASCULAR BALLOON ANGIOPLASTY;  Surgeon: Serafina Mitchell, MD;  Location: [facility];  Service: Cardiovascular;  Laterality: Left;  AT     reports that he quit smoking about 19 years ago. His smoking use included cigars. He has never used smokeless tobacco. He reports that he does not drink alcohol and does not use drugs.  Allergies  Allergen Reactions  . Vancomycin Rash    Family History  Problem Relation Age of Onset  . Diabetes Mother   . Heart attack Father   . Cancer Sister      Prior to Admission medications   Medication Sig Start Date End Date Taking? Authorizing Provider  aspirin EC 81 MG tablet Take 81 mg by mouth daily. Swallow whole.    [provider]  [name] Apply between toes as needed for maceration. 08/08/20   [name], DPM  cephALEXin (KEFLEX) 500 MG capsule Take 1 capsule (500 mg total) by mouth 2 (two) times daily. 09/04/20   Rhyne, Hulen Shouts, PA-C  colchicine 0.6 MG tablet Take 0.6 mg by mouth daily as needed (gout flare up).    [provider]  Continuous Blood Gluc Sensor (FREESTYLE LIBRE 14 DAY SENSOR) MISC Use as directed to check  blood sugars 05/13/20   [name], [name]  ferrous sulfate 325 (65 FE) MG tablet Take 325 mg by mouth every Monday, Wednesday, and Friday.    [provider]  fish oil-omega-3 fatty acids 1000 MG capsule Take 2 g by mouth daily.    [provider]  furosemide (LASIX) 40 MG tablet Take 2 tabs in morning and 1 tab evening Patient taking differently: Take 40-80 mg by mouth 2 (two) times daily. 80 mg in the morning and 40 mg at night 01/23/20   [name], [name]  gabapentin (NEURONTIN) 100 MG capsule Take 2 capsules in am 1 capsule mid day and 1 capsule in evening 08/17/20   [name], [name]  glucose  blood (FREESTYLE PRECISION NEO TEST) test strip Use as instructed 07/30/19   [name], [name]  latanoprost (XALATAN) 0.005 % ophthalmic solution Place 1 drop into both eyes at bedtime.  10/12/18   [provider]  losartan (COZAAR) 100 MG tablet Take 100 mg by mouth daily.    [provider]  Multiple Vitamin (MULTIVITAMIN WITH MINERALS) TABS tablet Take 1 tablet by mouth daily.    [provider]  pantoprazole (PROTONIX) 40 MG tablet Take 1 tablet  by mouth daily. Patient taking differently: Take 40 mg by mouth at bedtime. 07/15/20   [name], [name]  Polyvinyl Alcohol-Povidone (REFRESH OP) Place 1 drop into both eyes daily as needed (dry eyes).    [provider]  rosuvastatin (CRESTOR) 10 MG tablet Take 1 tablet (10 mg total) by mouth daily. Patient taking differently: Take 10 mg by mouth every 7 (seven) days. Wednesday of each week 11/07/19   Bonnielee Haff, MD  Semaglutide, [name], (OZEMPIC, [name],) 2 MG/1.5ML SOPN Inject 1 mg into the skin once a week. Patient taking differently: Inject 1 mg into the skin every Wednesday. 09/24/19   [name], [name]  tamsulosin (FLOMAX) 0.4 MG CAPS capsule Take 0.4 mg by mouth daily.    [provider]    Physical Exam: Vitals:   09/11/20 1538 09/11/20 1539 09/11/20 1540  BP: 123/71 123/71   Pulse: 86 86   Resp: 18 18   Temp: 98.3 F (36.8 C) 98.3 F (36.8 C)   TempSrc: Oral Oral   SpO2: 96% 96%   Weight:   115 kg  Height:   '6\' 1"'$  (1.854 m)    Constitutional: NAD, calm, comfortable Eyes: PERRL, lids and conjunctivae normal ENMT: Mucous membranes are moist. Posterior pharynx clear of any exudate or lesions.Normal dentition.  Neck: normal, supple, no masses, no thyromegaly Respiratory: clear to auscultation bilaterally, no wheezing, no crackles. Normal respiratory effort. No accessory muscle use.  Cardiovascular: Regular rate and rhythm, no murmurs / rubs / gallops. No extremity edema. 2+ pedal  pulses. No carotid bruits.  Abdomen: no tenderness, no masses palpated. No hepatosplenomegaly. Bowel sounds positive.  Musculoskeletal: no clubbing / cyanosis. No joint deformity upper and lower extremities. Good ROM, no contractures. Normal muscle tone.  Skin:    Neurologic: CN 2-12 grossly intact. Sensation intact, DTR normal. Strength 5/5 in all 4.  Psychiatric: Normal judgment and insight. Alert and oriented x 3. Normal mood.    Labs on Admission: I have personally reviewed following labs and imaging studies  CBC: Recent Labs  Lab 09/11/20 1800  WBC 9.2  NEUTROABS 6.8  HGB 9.1*  HCT 29.0*  MCV 94.5  PLT 469   Basic Metabolic Panel: Recent Labs  Lab 09/11/20 1800  NA 135  K 4.5  CL 103  CO2 24  GLUCOSE 116*  BUN 76*  CREATININE 2.84*  CALCIUM 9.6   GFR: Estimated Creatinine Clearance: 26.6 mL/min (A) (by C-G formula based on SCr of 2.84 mg/dL (H)). Liver Function Tests: No results for input(s): AST, ALT, ALKPHOS, BILITOT, PROT, ALBUMIN in the last 168 hours. No results for input(s): LIPASE, AMYLASE in the last 168 hours. No results for input(s): AMMONIA in the last 168 hours. Coagulation Profile: No results for input(s): INR, PROTIME in the last 168 hours. Cardiac Enzymes: No results for input(s): CKTOTAL, CKMB, CKMBINDEX, TROPONINI in the last 168 hours. BNP (last 3 results) No results for input(s): PROBNP in the last 8760 hours. HbA1C: No results for input(s): HGBA1C in the last 72 hours. CBG: No results for input(s): GLUCAP in the last 168 hours. Lipid Profile: No results for input(s): CHOL, HDL, LDLCALC, TRIG, CHOLHDL, LDLDIRECT in the last 72 hours. Thyroid Function Tests: No results for input(s): TSH, T4TOTAL, FREET4, T3FREE, THYROIDAB in the last 72 hours. Anemia Panel: No results for input(s): VITAMINB12, FOLATE, FERRITIN, TIBC, IRON, RETICCTPCT in the last 72 hours. Urine analysis:    Component Value Date/Time   COLORURINE YELLOW 08/09/2020  1159   APPEARANCEUR HAZY (A) 08/09/2020 1159   LABSPEC 1.012 08/09/2020 1159   PHURINE 5.0 08/09/2020 1159   GLUCOSEU NEGATIVE 08/09/2020 1159   HGBUR NEGATIVE 08/09/2020 1159   BILIRUBINUR NEGATIVE 08/09/2020 1159   BILIRUBINUR negative 05/15/2019 1029   KETONESUR NEGATIVE 08/09/2020 1159   PROTEINUR 30 (A) 08/09/2020 1159   UROBILINOGEN 0.2 05/15/2019 1029   UROBILINOGEN 0.2 01/08/2011 1558   NITRITE NEGATIVE 08/09/2020 1159   LEUKOCYTESUR NEGATIVE 08/09/2020 1159    Radiological Exams on Admission: No results found.  EKG: Independently reviewed.  Assessment/Plan Principal Problem:   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HCC) Active Problems:   CKD (chronic kidney disease), stage III (HCC)   AKI (acute kidney injury) ([place])   Controlled type 2 diabetes mellitus with stable proliferative retinopathy of both eyes, with long-term current use of insulin (HCC)   Diabetic ulcer of left great toe (HCC)    1. L hallux osteomyelitis and diabetic wound infection - 1. Diabetic wound pathway 2. Rocephin + Flagyl + LZD for the moment (pt with allergy to vanc). 3. See Vasc surgery consult note 4. Call podiatry in AM, presumably will need amputation during admission. 2. AKI on CKD 3 - 1. Hold home lasix 2. Hold home ARB 3. IVF: LR at 125 cc/hr overnight 4. Strict intake and output 5. Repeat BMP in AM 3. HTN - 1. Cont home meds other than lasix and ARB as above 4. DM2 - 1. Hold home ozempic 2. Sensitive scale SSI AC  DVT prophylaxis: Lovenox Code Status: Full Family Communication: Family at bedside Disposition Plan: Home after toe amputation Consults called: Vasc surg Admission status: Admit to inpatient  Severity of Illness: The appropriate patient status for this patient is INPATIENT. Inpatient status is judged to be reasonable and necessary in order to provide the required intensity of service to ensure the patient's safety. The patient's presenting symptoms, physical exam findings,  and initial radiographic and laboratory data in the context of their chronic comorbidities is felt to place them at high risk for further clinical deterioration. Furthermore, it is not anticipated that the patient will be medically stable for discharge from the hospital within 2 midnights of admission. The following factors support the patient status of inpatient.   IP status due to failed outpt abx.  Also due to AKI with doubling of creat from baseline.   * I certify that at the point of admission it is my clinical judgment that the patient will require inpatient hospital care spanning beyond 2 midnights from the point of admission due to high intensity of service, high risk for further deterioration and high frequency of surveillance required.*    Rori Goar M. DO [facility]  How to contact the [facility] Attending or Consulting provider [place] or covering provider during after hours [place], for this patient?  1. Check the care team in [facility] and look for a) attending/consulting [facility] provider listed and b) the [facility] G[facility]enterology team listed 2. Log into [email or web address]  Amion Physician Scheduling and messaging for groups and whole hospitals  On call and physician scheduling software for group practices, residents, hospitalists and other medical providers for call, clinic, rotation and shift schedules. OnCall [employer] is a hospital-wide system for scheduling doctors and paging doctors on call. EasyPlot is for scientific plotting and data analysis.  [email or web address]  and use [facility]'s universal password to access. If you do not have the password, please contact the hospital operator.  3. Locate the [facility] provider you are looking for under [facility] and page to a number that you can be directly reached. 4. If you still have difficulty reaching the provider, please page the [facility] (Director on Call) for the Hospitalists listed on amion for assistance.  09/11/2020, 8:04 PM

## 2020-09-12 LAB — GLUCOSE, CAPILLARY
Glucose-Capillary: 100 mg/dL — ABNORMAL HIGH (ref 70–99)
Glucose-Capillary: 89 mg/dL (ref 70–99)
Glucose-Capillary: 102 mg/dL — ABNORMAL HIGH (ref 70–99)
Glucose-Capillary: 106 mg/dL — ABNORMAL HIGH (ref 70–99)

## 2020-09-12 LAB — CBC
HCT: 29.2 % — ABNORMAL LOW (ref 39.0–52.0)
Hemoglobin: 9.4 g/dL — ABNORMAL LOW (ref 13.0–17.0)
MCH: 30 pg (ref 26.0–34.0)
MCHC: 32.2 g/dL (ref 30.0–36.0)
MCV: 93.3 fL (ref 80.0–100.0)
Platelets: 252 10*3/uL (ref 150–400)
RBC: 3.13 MIL/uL — ABNORMAL LOW (ref 4.22–5.81)
RDW: 16 % — ABNORMAL HIGH (ref 11.5–15.5)
WBC: 7.2 10*3/uL (ref 4.0–10.5)
nRBC: 0 % (ref 0.0–0.2)

## 2020-09-12 LAB — BASIC METABOLIC PANEL
Anion gap: 7 (ref 5–15)
BUN: 63 mg/dL — ABNORMAL HIGH (ref 8–23)
CO2: 24 mmol/L (ref 22–32)
Calcium: 9.5 mg/dL (ref 8.9–10.3)
Chloride: 105 mmol/L (ref 98–111)
Creatinine, Ser: 2.26 mg/dL — ABNORMAL HIGH (ref 0.61–1.24)
GFR, Estimated: 28 mL/min — ABNORMAL LOW (ref >60)
Glucose, Bld: 101 mg/dL — ABNORMAL HIGH (ref 70–99)
Potassium: 4 mmol/L (ref 3.5–5.1)
Sodium: 136 mmol/L (ref 135–145)

## 2020-09-12 MED ORDER — DORZOLAMIDE HCL 2 % OP SOLN
1.0000 [drp] | Freq: Every day | OPHTHALMIC | Status: DC
  Administered 2020-09-13 – 2020-09-16 (×4): 1 [drp] via OPHTHALMIC

## 2020-09-12 MED ORDER — JUVEN PO PACK
1.0000 | PACK | Freq: Two times a day (BID) | ORAL | Status: DC
  Administered 2020-09-12 – 2020-09-16 (×7): 1 via ORAL
  Filled 2020-09-12 (×7): qty 1

## 2020-09-12 MED ORDER — LATANOPROST 0.005 % OP SOLN
1.0000 [drp] | Freq: Every day | OPHTHALMIC | Status: DC
  Administered 2020-09-13 – 2020-09-16 (×4): 1 [drp] via OPHTHALMIC

## 2020-09-12 NOTE — Progress Notes (Signed)
Pt placed on cpap for the night. °

## 2020-09-12 NOTE — Progress Notes (Addendum)
PROGRESS NOTE    Micheal Walls  FBP:[id number] DOB: 07-31-1935 DOA: 09/11/2020 PCP: [name], [name]    Brief Narrative:  85 y/o male with history of PAD, DM, CKD 3, admitted to the hospital with worsening wound of left great toe and surrounding cellulitis. Seen by vascular surgery and felt to have adequate blood flow. Podiatry consulted for possible amputation. Currently on broad spectrum antibiotics.   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 Amalia Hailey on for podiatry who will see the patient about possible amputation  AKI on CKD3 -creatinine improving with IV fluids -baseline creatinine around 1.2 -continue to monitor -hold ARB and home lasix  HTN -blood pressure currently stable, continue to monitor -continue cardizem  DM2.  -ozempic currently on hold -on SSI -Blood sugars currently stable  Anemia -suspect this is related to chronic disease from his wound and kidney disease -continue to follow hemoglobin -no evidence of bleeding at this time  HLD -continue statin  PAD -recent angioplasty on 08/11/20 with Dr. Trula Slade -continue on asa, plavix   DVT prophylaxis: enoxaparin (LOVENOX) injection 40 mg Start: 09/11/20 2000  Code Status: full code Family Communication: discussed with patient Disposition Plan: Status is: Inpatient  Remains inpatient appropriate  because:Inpatient level of care appropriate due to severity of illness   Dispo: The patient is from: Home              Anticipated d/c is to: Home              Patient currently is not medically stable to d/c.   Difficult to place patient No         Consultants:   Podiatry  Vascular surgery  Procedures:     Antimicrobials:   Ceftriaxone 4/1>  Flagyl 4/1>  Linezolid 4/1>   Subjective: No pain in foot, no shortness of breath  Objective: Vitals:   09/12/20 0023 09/12/20 0900 09/12/20 0956 09/12/20 1300  BP: (!) 130/59  116/65 137/79  Pulse: 93  97 87  Resp: $Remo'18  17 18  'oAEcw$ Temp: 98.6 F (37 C)  98.3 F (36.8 C) 98.1 F (36.7 C)  TempSrc: Oral  Oral Oral  SpO2: 100% 99% 99% 99%  Weight:      Height:        Intake/Output Summary (Last 24 hours) at 09/12/2020 1657 Last data filed at 09/12/2020 1336 Gross per 24 hour  Intake 701.44 ml  Output 700 ml  Net 1.44 ml   Filed Weights   09/11/20 1540  Weight: 115 kg    Examination:  General exam: Appears calm and comfortable  Respiratory system: Clear to auscultation. Respiratory effort normal. Cardiovascular system: S1 & S2 heard, RRR. No JVD, murmurs, rubs, gallops or clicks.  Gastrointestinal system: Abdomen is nondistended, soft and nontender. No organomegaly or masses felt. Normal bowel sounds heard. Central nervous system: Alert and oriented. No focal neurological deficits. Extremities: R BKA, left  great toe with areas of necrosis, left lower leg is warm to touch Skin: No rashes, lesions or ulcers Psychiatry: Judgement and insight appear normal. Mood & affect appropriate.     Data Reviewed: I have personally reviewed following labs and imaging studies  CBC: Recent Labs  Lab 09/11/20 1800 09/12/20 0513  WBC 9.2 7.2  NEUTROABS 6.8  --   HGB 9.1* 9.4*  HCT 29.0* 29.2*  MCV 94.5 93.3  PLT 249 789   Basic Metabolic Panel: Recent Labs  Lab 09/11/20 1800 09/12/20 0513  NA 135 136  K 4.5 4.0   CL 103 105  CO2 24 24  GLUCOSE 116* 101*  BUN 76* 63*  CREATININE 2.84* 2.26*  CALCIUM 9.6 9.5   GFR: Estimated Creatinine Clearance: 33.5 mL/min (A) (by C-G formula based on SCr of 2.26 mg/dL (H)). Liver Function Tests: No results for input(s): AST, ALT, ALKPHOS, BILITOT, PROT, ALBUMIN in the last 168 hours. No results for input(s): LIPASE, AMYLASE in the last 168 hours. No results for input(s): AMMONIA in the last 168 hours. Coagulation Profile: No results for input(s): INR, PROTIME in the last 168 hours. Cardiac Enzymes: No results for input(s): CKTOTAL, CKMB, CKMBINDEX, TROPONINI in the last 168 hours. BNP (last 3 results) No results for input(s): PROBNP in the last 8760 hours. HbA1C: No results for input(s): HGBA1C in the last 72 hours. CBG: Recent Labs  Lab 09/11/20 2202 09/12/20 0756 09/12/20 1157  GLUCAP 102* 100* 89   Lipid Profile: No results for input(s): CHOL, HDL, LDLCALC, TRIG, CHOLHDL, LDLDIRECT in the last 72 hours. Thyroid Function Tests: No results for input(s): TSH, T4TOTAL, FREET4, T3FREE, THYROIDAB in the last 72 hours. Anemia Panel: No results for input(s): VITAMINB12, FOLATE, FERRITIN, TIBC, IRON, RETICCTPCT in the last 72 hours. Sepsis Labs: Recent Labs  Lab 09/11/20 1800 09/11/20 2010  LATICACIDVEN 0.7 0.9    Recent Results (from the past 240 hour(s))  Resp Panel by RT-PCR (Flu A&B, Covid) Nasopharyngeal Swab     Status: None   Collection Time: 09/11/20  5:13 PM   Specimen: Nasopharyngeal Swab; Nasopharyngeal(NP) swabs in vial transport medium  Result Value Ref Range Status   SARS Coronavirus 2 by RT PCR NEGATIVE NEGATIVE Final    Comment: (NOTE) SARS-CoV-2 target nucleic acids are NOT DETECTED.  The SARS-CoV-2 RNA is generally detectable in upper respiratory specimens during the acute phase of infection. The lowest concentration of SARS-CoV-2 viral copies this assay can detect is 138 copies/mL. A negative result does not preclude  SARS-Cov-2 infection and should not be used as the sole basis for treatment or other patient management decisions. A negative result may occur with  improper specimen collection/handling, submission of specimen other than nasopharyngeal swab, presence of viral mutation(s) within the areas targeted by this assay, and inadequate number of viral copies(<138 copies/mL). A negative result must be combined with clinical observations, patient history, and epidemiological information. The expected result is Negative.  Fact Sheet for Patients:  EntrepreneurPulse.com.au  Fact Sheet for Healthcare Providers:  IncredibleEmployment.be  This test is no t yet approved or cleared by the [place] FDA and  has been authorized for detection and/or diagnosis of SARS-CoV-2 by FDA under an Emergency Use Authorization (EUA). This EUA will remain  in effect (meaning this test can be used) for the duration of the COVID-19 declaration under Section 564(b)(1) of the Act, 21 U.S.C.section 360bbb-3(b)(1), unless the authorization is terminated  or revoked sooner.       Influenza A by  PCR NEGATIVE NEGATIVE Final   Influenza B by PCR NEGATIVE NEGATIVE Final    Comment: (NOTE) The Xpert Xpress SARS-CoV-2/FLU/RSV plus assay is intended as an aid in the diagnosis of influenza from Nasopharyngeal swab specimens and should not be used as a sole basis for treatment. Nasal washings and aspirates are unacceptable for Xpert Xpress SARS-CoV-2/FLU/RSV testing.  Fact Sheet for Patients: EntrepreneurPulse.com.au  Fact Sheet for Healthcare Providers: IncredibleEmployment.be  This test is not yet approved or cleared by the [place] FDA and has been authorized for detection and/or diagnosis of SARS-CoV-2 by FDA under an Emergency Use Authorization (EUA). This EUA will remain in effect (meaning this test can be used) for the duration of  the COVID-19 declaration under Section 564(b)(1) of the Act, 21 U.S.C. section 360bbb-3(b)(1), unless the authorization is terminated or revoked.  Performed at [facility], [place] [address] [place], [place] [address]   Blood culture (routine x 2)     Status: None (Preliminary result)   Collection Time: 09/11/20  5:25 PM   Specimen: BLOOD  Result Value Ref Range Status   Specimen Description BLOOD LEFT ANTECUBITAL  Final   Special Requests   Final    BOTTLES DRAWN AEROBIC AND ANAEROBIC Blood Culture adequate volume   Culture   Final    NO GROWTH < 24 HOURS Performed at [facility], [place] [address], [place], [place] [address]    Report Status PENDING  Incomplete  Blood culture (routine x 2)     Status: None (Preliminary result)   Collection Time: 09/11/20  6:00 PM   Specimen: BLOOD LEFT HAND  Result Value Ref Range Status   Specimen Description BLOOD LEFT HAND  Final   Special Requests   Final    BOTTLES DRAWN AEROBIC AND ANAEROBIC Blood Culture adequate volume   Culture   Final    NO GROWTH < 24 HOURS Performed at [facility], [place] [address] [place],  [address]    Report Status PENDING  Incomplete         Radiology Studies: No results found.      Scheduled Meds: . allopurinol  100 mg Oral Daily  . aspirin EC  81 mg Oral Daily  . clopidogrel  75 mg Oral Daily  . diltiazem  300 mg Oral Daily  . [START ON 09/13/2020] dorzolamide  1 drop Both Eyes Daily  . enoxaparin (LOVENOX) injection  40 mg Subcutaneous Q24H  . [START ON 09/14/2020] ferrous sulfate  325 mg Oral Q M,W,F  . gabapentin  100 mg Oral 2 times per day  . gabapentin  200 mg Oral Daily  . insulin aspart  0-9 Units Subcutaneous TID WC  . [START ON 09/13/2020] latanoprost  1 drop Both Eyes Daily  . linezolid  600 mg Oral Q12H  . metroNIDAZOLE  500 mg Oral Q8H  . multivitamin with minerals  1 tablet Oral Daily  . nutrition supplement (JUVEN)  1 packet Oral BID BM  . omega-3 acid  ethyl esters  2 g Oral Daily  . pantoprazole  40 mg Oral QHS  . [START ON 09/16/2020] rosuvastatin  10 mg Oral Q7 days  . tamsulosin  0.4 mg Oral Daily   Continuous Infusions: . cefTRIAXone (ROCEPHIN)  IV Stopped (09/11/20 6659)  . lactated ringers 125 mL/hr at 09/12/20 1300     LOS: 1 day    Time spent: 70mins    Kathie Dike, MD [facility]   If 7PM-7AM,  please contact night-coverage [email or web address]  09/12/2020, 4:57 PM

## 2020-09-12 NOTE — Progress Notes (Signed)
Initial Nutrition Assessment  DOCUMENTATION CODES:   Not applicable  INTERVENTION:   Juven BID, each packet provides 80 calories, 8 grams of carbohydrate, 2.5  grams of protein (collagen), 7 grams of L-arginine and 7 grams of L-glutamine; supplement contains CaHMB, Vitamins C, E, B12 and Zinc to promote wound healing  Double portion of protein at meals  Continue MVI with Minerals daily  Pt may need additional oral nutrition supplements if po intake inadquate   NUTRITION DIAGNOSIS:   Increased nutrient needs related to wound healing as evidenced by estimated needs.  GOAL:   Patient will meet greater than or equal to 90% of their needs  MONITOR:   Labs,PO intake,Supplement acceptance,Skin  REASON FOR ASSESSMENT:   Consult Wound healing  ASSESSMENT:   85 yo male admitted with L great toe diabetic ulcer and infection for past couple of months. Pt had L anterior tibial angioplasty on 3/1.  PMH includes DM, R BKA, OSA, HTN   Currently on Carb Modified diet, recorded po intake 75% of breakfast this AM. Appetite good  Lab Results  Component Value Date   HGBA1C 6.1 (H) 07/19/2020   DM appears well controlled  Current wt 115 kg; unsure of any changes in weight   Labs: reviewed; CBG 100-102 Meds: ferrous sulfate, ss novolog, LR at 125 ml/hr, MVI with Minerals, Lovaza   Diet Order:   Diet Order            Diet Carb Modified Fluid consistency: Thin; Room service appropriate? Yes  Diet effective now                 EDUCATION NEEDS:   Education needs have been addressed  Skin:  Skin Assessment: Reviewed RN Assessment  Last BM:  4/1  Height:   Ht Readings from Last 1 Encounters:  09/11/20 $RemoveB'6\' 1"'vSKVEjtZ$  (1.854 m)    Weight:   Wt Readings from Last 1 Encounters:  09/11/20 115 kg    BMI:  Body mass index is 33.46 kg/m.  Estimated Nutritional Needs:   Kcal:  2000-2200 kcals  Protein:  110-125 g  Fluid:  >/= 2 L   Kerman Passey MS, RDN, LDN,  CNSC Registered Dietitian III Clinical Nutrition RD Pager and On-Call Pager Number Located in [place]

## 2020-09-12 NOTE — Plan of Care (Signed)

## 2020-09-13 DIAGNOSIS — I96 Gangrene, not elsewhere classified: Secondary | ICD-10-CM

## 2020-09-13 LAB — GLUCOSE, CAPILLARY
Glucose-Capillary: 128 mg/dL — ABNORMAL HIGH (ref 70–99)
Glucose-Capillary: 101 mg/dL — ABNORMAL HIGH (ref 70–99)
Glucose-Capillary: 109 mg/dL — ABNORMAL HIGH (ref 70–99)
Glucose-Capillary: 147 mg/dL — ABNORMAL HIGH (ref 70–99)

## 2020-09-13 LAB — BASIC METABOLIC PANEL
Anion gap: 5 (ref 5–15)
BUN: 51 mg/dL — ABNORMAL HIGH (ref 8–23)
CO2: 25 mmol/L (ref 22–32)
Calcium: 9.5 mg/dL (ref 8.9–10.3)
Chloride: 107 mmol/L (ref 98–111)
Creatinine, Ser: 1.84 mg/dL — ABNORMAL HIGH (ref 0.61–1.24)
GFR, Estimated: 36 mL/min — ABNORMAL LOW (ref >60)
Glucose, Bld: 91 mg/dL (ref 70–99)
Potassium: 4.5 mmol/L (ref 3.5–5.1)
Sodium: 137 mmol/L (ref 135–145)

## 2020-09-13 MED ORDER — DOXYCYCLINE HYCLATE 100 MG PO TABS
100.0000 mg | ORAL_TABLET | Freq: Two times a day (BID) | ORAL | Status: DC
  Administered 2020-09-13 – 2020-09-16 (×7): 100 mg via ORAL
  Filled 2020-09-13 (×7): qty 1

## 2020-09-13 MED ORDER — MAGNESIUM HYDROXIDE 400 MG/5ML PO SUSP
30.0000 mL | Freq: Once | ORAL | Status: AC
  Administered 2020-09-13: 30 mL via ORAL
  Filled 2020-09-13: qty 30

## 2020-09-13 NOTE — Consult Note (Signed)
PODIATRY CONSULTATION  NAME Micheal Walls MRN [id number] DOB 1936/06/07 DOA 09/11/2020   Reason for consult: Ischemic gangrene left great toe  Chief Complaint  Patient presents with  . Foot Injury    Consulting physician: Micheal Dike MD  History of present illness: 85 y.o. male admitted 09/11/2020 for worsening infection left foot.  Patient has a PSxHx of BKA RLE.  Patient's daughter states that he has had a left great toe wound for approximately 6 months now being managed outpatient.  The daughter really began to be concerned with worsening infection over the weekend and presented to the emergency department where he was admitted.  Podiatry was consulted for ischemia to the left great toe  Past Medical History:  Diagnosis Date  . Acute metabolic encephalopathy 4/0/1027  . Amputated below knee ([place])    right  . Anemia   . Aspiration pneumonia ([place]) 11/02/2019  . Diastolic heart failure ([place])   . Diverticulosis   . DM (diabetes mellitus) ([place])   . Gout   . Hyperlipemia   . OSA on CPAP   . RBBB   . Sepsis ([place]) 11/02/2019  . Systemic hypertension     CBC Latest Ref Rng & Units 09/12/2020 09/11/2020 08/27/2020  WBC 4.0 - 10.5 K/uL 7.2 9.2 5.7  Hemoglobin 13.0 - 17.0 g/dL 9.4(L) 9.1(L) 9.9(L)  Hematocrit 39.0 - 52.0 % 29.2(L) 29.0(L) 29.0(L)  Platelets 150 - 400 K/uL 252 249 224    BMP Latest Ref Rng & Units 09/13/2020 09/12/2020 09/11/2020  Glucose 70 - 99 mg/dL 91 101(H) 116(H)  BUN 8 - 23 mg/dL 51(H) 63(H) 76(H)  Creatinine 0.61 - 1.24 mg/dL 1.84(H) 2.26(H) 2.84(H)  BUN/Creat Ratio 6 - 22 (calc) - - -  Sodium 135 - 145 mmol/L 137 136 135  Potassium 3.5 - 5.1 mmol/L 4.5 4.0 4.5  Chloride 98 - 111 mmol/L 107 105 103  CO2 22 - 32 mmol/L $RemoveB'25 24 24  'RVoYEhYB$ Calcium 8.9 - 10.3 mg/dL 9.5 9.5 9.6      Physical Exam: General: The patient is alert and oriented x3 in no acute distress.   Dermatology: Skin is warm, dry and supple left lower extremities. Negative for open lesions or  macerations.  Vascular: Vascular consulted on the patient 09/11/2020.  Vascular status optimized as best as can be possible.  Today there appears to be significantly improved cellulitis compared to prior pictures taken in the ED, with a decrease in erythema and edema  Neurological: Epicritic and protective threshold grossly intact left lower extremity  Musculoskeletal Exam: BKA RLE.  No structural deformity noted or atrophy LLE.     ASSESSMENT/PLAN OF CARE Ischemic gangrene left great toe -Recommend amputation of the left great toe while the patient is admitted.  I spoke with the patient and his daughter, via [name]'s cell phone, and discussed surgery.  Both agreed to proceed with surgical amputation of the toe. All patient and patient's daughter's questions were answered today. -Preoperative orders placed.  Surgery tentatively scheduled for tomorrow, 09/14/2020, after 5 PM.  N.p.o. after full breakfast -Continue antibiotics as per admitting physician -Podiatry will continue to follow   Thank you for the consult.  Please contact me directly with any questions or concerns.  Cell [phone]   Micheal Walls, DPM [facility]  Dr. Edrick Walls, DPM    [address]  [place], [place] [address]                Office [phone]  Fax [phone]

## 2020-09-13 NOTE — Progress Notes (Addendum)
PROGRESS NOTE    Micheal Walls  FVC:[id number] DOB: 1935-12-25 DOA: 09/11/2020 PCP: [name], [name]    Brief Narrative:  85 y/o male with history of PAD, DM, CKD 3, admitted to the hospital with worsening wound of left great toe and surrounding cellulitis. Seen by vascular surgery and felt to have adequate blood flow. Podiatry consulted for possible amputation. Currently on broad spectrum antibiotics.   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 Amalia Hailey on for podiatry who will see the patient about possible amputation  AKI on CKD3 -creatinine improving with IV fluids -baseline creatinine around 1.2 -continue to monitor -hold ARB and home lasix  HTN -blood pressure currently stable, continue to monitor -continue cardizem  DM2.  -ozempic currently on hold -on SSI -Blood sugars currently stable  Anemia -suspect this is related to chronic disease from his wound and kidney disease -continue to follow hemoglobin -no evidence of bleeding at this time  HLD -continue statin  PAD -recent angioplasty on 08/11/20 with Dr. Trula Slade -continue on asa, plavix   DVT prophylaxis: enoxaparin (LOVENOX) injection 40 mg Start: 09/11/20 2000  Code Status: full code Family Communication: discussed with patient Disposition Plan: Status is: Inpatient  Remains inpatient appropriate  because:Inpatient level of care appropriate due to severity of illness   Dispo: The patient is from: Home              Anticipated d/c is to: Home              Patient currently is not medically stable to d/c.   Difficult to place patient No    Consultants:   Podiatry  Vascular surgery  Procedures:     Antimicrobials:   Ceftriaxone 4/1>  Flagyl 4/1> 4/3  Linezolid 4/1>4/3  Doxycycline 4/3>   Subjective: No new complaints. Per patient, he was seen earlier today by podiatrist  Objective: Vitals:   09/13/20 0117 09/13/20 0431 09/13/20 0925 09/13/20 1428  BP:  114/74 119/64 (!) 134/47  Pulse: 88 (!) 55 81 79  Resp: $Remo'19 19 18 20  'WTMtw$ Temp:  98 F (36.7 C) 97.9 F (36.6 C) 98 F (36.7 C)  TempSrc:   Oral Oral  SpO2: 98% 92% 98% 98%  Weight:      Height:        Intake/Output Summary (Last 24 hours) at 09/13/2020 1535 Last data filed at 09/13/2020 1300 Gross per 24 hour  Intake 960 ml  Output 1500 ml  Net -540 ml   Filed Weights   09/11/20 1540  Weight: 115 kg    Examination:  General exam: Alert, awake, oriented x 3 Respiratory system: Clear to auscultation. Respiratory effort normal. Cardiovascular system:RRR. No murmurs, rubs, gallops. Gastrointestinal system: Abdomen is nondistended, soft and nontender. No organomegaly or masses felt. Normal bowel sounds heard. Central nervous system: Alert and oriented. No focal neurological deficits. Extremities: R BKA, L great toe with areas of  ulceration/necrosis Skin: No rashes, lesions or ulcers Psychiatry: Judgement and insight appear normal. Mood & affect appropriate.      Data Reviewed: I have personally reviewed following labs and imaging studies  CBC: Recent Labs  Lab 09/11/20 1800 09/12/20 0513  WBC 9.2 7.2  NEUTROABS 6.8  --   HGB 9.1* 9.4*  HCT 29.0* 29.2*  MCV 94.5 93.3  PLT 249 071   Basic Metabolic Panel: Recent Labs  Lab 09/11/20 1800 09/12/20 0513 09/13/20 0129  NA 135 136 137  K  4.5 4.0 4.5  CL 103 105 107  CO2 $Re'24 24 25  'OYB$ GLUCOSE 116* 101* 91  BUN 76* 63* 51*  CREATININE 2.84* 2.26* 1.84*  CALCIUM 9.6 9.5 9.5   GFR: Estimated Creatinine Clearance: 41.1 mL/min (A) (by C-G formula based on SCr of 1.84 mg/dL (H)). Liver Function Tests: No results for input(s): AST, ALT, ALKPHOS, BILITOT, PROT, ALBUMIN in the last 168 hours. No results for input(s): LIPASE, AMYLASE in the last 168 hours. No results for input(s): AMMONIA in the last 168 hours. Coagulation Profile: No results for input(s): INR, PROTIME in the last 168 hours. Cardiac Enzymes: No results for input(s): CKTOTAL, CKMB, CKMBINDEX, TROPONINI in the last 168 hours. BNP (last 3 results) No results for input(s): PROBNP in the last 8760 hours. HbA1C: No results for input(s): HGBA1C in the last 72 hours. CBG: Recent Labs  Lab 09/12/20 1157 09/12/20 1711 09/12/20 2213 09/13/20 0854 09/13/20 1233  GLUCAP 89 102* 106* 128* 101*   Lipid Profile: No results for input(s): CHOL, HDL, LDLCALC, TRIG, CHOLHDL, LDLDIRECT in the last 72 hours. Thyroid Function Tests: No results for input(s): TSH, T4TOTAL, FREET4, T3FREE, THYROIDAB in the last 72 hours. Anemia Panel: No results for input(s): VITAMINB12, FOLATE, FERRITIN, TIBC, IRON, RETICCTPCT in the last 72 hours. Sepsis Labs: Recent Labs  Lab 09/11/20 1800 09/11/20 2010  LATICACIDVEN 0.7 0.9    Recent Results (from the past 240 hour(s))  Resp Panel by RT-PCR (Flu A&B, Covid) Nasopharyngeal Swab     Status: None   Collection Time: 09/11/20  5:13 PM   Specimen: Nasopharyngeal Swab; Nasopharyngeal(NP) swabs in vial transport medium  Result Value Ref Range Status   SARS Coronavirus 2 by RT PCR NEGATIVE NEGATIVE Final    Comment: (NOTE) SARS-CoV-2 target nucleic acids are NOT DETECTED.  The SARS-CoV-2 RNA is generally detectable in upper respiratory specimens during the acute phase of infection. The lowest concentration of SARS-CoV-2 viral copies this  assay can detect is 138 copies/mL. A negative result does not preclude SARS-Cov-2 infection and should not be used as the sole basis for treatment or other patient management decisions. A negative result may occur with  improper specimen collection/handling, submission of specimen other than nasopharyngeal swab, presence of viral mutation(s) within the areas targeted by this assay, and inadequate number of viral copies(<138 copies/mL). A negative result must be combined with clinical observations, patient history, and epidemiological information. The expected result is Negative.  Fact Sheet for Patients:  EntrepreneurPulse.com.au  Fact Sheet for Healthcare Providers:  IncredibleEmployment.be  This test is no t yet approved or cleared by the [place] FDA and  has been authorized for detection and/or diagnosis of SARS-CoV-2 by FDA under an Emergency Use Authorization (EUA). This EUA will remain  in effect (meaning this test can be used) for the duration of the COVID-19 declaration under Section 564(b)(1) of the Act, 21 U.S.C.section 360bbb-3(b)(1), unless the authorization is terminated  or revoked sooner.  Influenza A by PCR NEGATIVE NEGATIVE Final   Influenza B by PCR NEGATIVE NEGATIVE Final    Comment: (NOTE) The Xpert Xpress SARS-CoV-2/FLU/RSV plus assay is intended as an aid in the diagnosis of influenza from Nasopharyngeal swab specimens and should not be used as a sole basis for treatment. Nasal washings and aspirates are unacceptable for Xpert Xpress SARS-CoV-2/FLU/RSV testing.  Fact Sheet for Patients: EntrepreneurPulse.com.au  Fact Sheet for Healthcare Providers: IncredibleEmployment.be  This test is not yet approved or cleared by the [place] FDA and has been authorized for detection and/or diagnosis of SARS-CoV-2 by FDA under an Emergency Use Authorization (EUA). This EUA will  remain in effect (meaning this test can be used) for the duration of the COVID-19 declaration under Section 564(b)(1) of the Act, 21 U.S.C. section 360bbb-3(b)(1), unless the authorization is terminated or revoked.  Performed at [facility], [place] [address] [place], [place] [address]   Blood culture (routine x 2)     Status: None (Preliminary result)   Collection Time: 09/11/20  5:25 PM   Specimen: BLOOD  Result Value Ref Range Status   Specimen Description BLOOD LEFT ANTECUBITAL  Final   Special Requests   Final    BOTTLES DRAWN AEROBIC AND ANAEROBIC Blood Culture adequate volume   Culture   Final    NO GROWTH 2 DAYS Performed at [facility], [place] [address] [place], [place] [address]    Report Status PENDING  Incomplete  Blood culture (routine x 2)     Status: None (Preliminary result)   Collection Time: 09/11/20  6:00 PM   Specimen: BLOOD LEFT HAND  Result Value Ref Range Status   Specimen Description BLOOD LEFT HAND  Final   Special Requests   Final    BOTTLES DRAWN AEROBIC AND ANAEROBIC Blood Culture adequate volume   Culture   Final    NO GROWTH 2 DAYS Performed at [facility], [place] [address], [place], [place] [address]    Report Status PENDING  Incomplete         Radiology Studies: No results found.      Scheduled Meds: . allopurinol  100 mg Oral Daily  . aspirin EC  81 mg Oral Daily  . clopidogrel  75 mg Oral Daily  . diltiazem  300 mg Oral Daily  . dorzolamide  1 drop Both Eyes Daily  . doxycycline  100 mg Oral Q12H  . enoxaparin (LOVENOX) injection  40 mg Subcutaneous Q24H  . [START ON 09/14/2020] ferrous sulfate  325 mg Oral Q M,W,F  . gabapentin  100 mg Oral 2 times per day  . gabapentin  200 mg Oral Daily  . insulin aspart  0-9 Units Subcutaneous TID WC  . latanoprost  1 drop Both Eyes Daily  . magnesium hydroxide  30 mL Oral Once  . multivitamin with minerals  1 tablet Oral Daily  . nutrition supplement (JUVEN)  1 packet  Oral BID BM  . omega-3 acid ethyl esters  2 g Oral Daily  . pantoprazole  40 mg Oral QHS  . [START ON 09/16/2020] rosuvastatin  10 mg Oral Q7 days  . tamsulosin  0.4 mg Oral Daily   Continuous Infusions: . cefTRIAXone (ROCEPHIN)  IV 2 g (09/12/20 2052)  . lactated ringers 125 mL/hr at 09/13/20 0814     LOS: 2 days    Time spent: 18mins    Kathie Dike, MD [facility]   If 7PM-7AM, please contact night-coverage  [email or web address]  09/13/2020, 3:35 PM

## 2020-09-14 ENCOUNTER — Encounter (HOSPITAL_COMMUNITY): Payer: Self-pay | Admitting: Internal Medicine

## 2020-09-14 ENCOUNTER — Inpatient Hospital Stay (HOSPITAL_COMMUNITY): Payer: Medicare Other

## 2020-09-14 ENCOUNTER — Inpatient Hospital Stay (HOSPITAL_COMMUNITY): Payer: Medicare Other | Admitting: Anesthesiology

## 2020-09-14 ENCOUNTER — Encounter (HOSPITAL_COMMUNITY)
Admission: EM | Disposition: A | Discharge status: Home Health | Payer: Self-pay | Source: Home / Self Care | Attending: Internal Medicine

## 2020-09-14 DIAGNOSIS — M869 Osteomyelitis, unspecified: Secondary | ICD-10-CM

## 2020-09-14 HISTORY — PX: AMPUTATION: SHX166

## 2020-09-14 LAB — CBC
HCT: 26.5 % — ABNORMAL LOW (ref 39.0–52.0)
Hemoglobin: 8.6 g/dL — ABNORMAL LOW (ref 13.0–17.0)
MCH: 30.9 pg (ref 26.0–34.0)
MCHC: 32.5 g/dL (ref 30.0–36.0)
MCV: 95.3 fL (ref 80.0–100.0)
Platelets: 247 10*3/uL (ref 150–400)
RBC: 2.78 MIL/uL — ABNORMAL LOW (ref 4.22–5.81)
RDW: 16 % — ABNORMAL HIGH (ref 11.5–15.5)
WBC: 7.8 10*3/uL (ref 4.0–10.5)
nRBC: 0 % (ref 0.0–0.2)

## 2020-09-14 LAB — BASIC METABOLIC PANEL
Anion gap: 4 — ABNORMAL LOW (ref 5–15)
BUN: 44 mg/dL — ABNORMAL HIGH (ref 8–23)
CO2: 25 mmol/L (ref 22–32)
Calcium: 9.6 mg/dL (ref 8.9–10.3)
Chloride: 108 mmol/L (ref 98–111)
Creatinine, Ser: 1.69 mg/dL — ABNORMAL HIGH (ref 0.61–1.24)
GFR, Estimated: 40 mL/min — ABNORMAL LOW (ref >60)
Glucose, Bld: 115 mg/dL — ABNORMAL HIGH (ref 70–99)
Potassium: 5.1 mmol/L (ref 3.5–5.1)
Sodium: 137 mmol/L (ref 135–145)

## 2020-09-14 LAB — GLUCOSE, CAPILLARY
Glucose-Capillary: 104 mg/dL — ABNORMAL HIGH (ref 70–99)
Glucose-Capillary: 110 mg/dL — ABNORMAL HIGH (ref 70–99)
Glucose-Capillary: 103 mg/dL — ABNORMAL HIGH (ref 70–99)
Glucose-Capillary: 103 mg/dL — ABNORMAL HIGH (ref 70–99)
Glucose-Capillary: 110 mg/dL — ABNORMAL HIGH (ref 70–99)

## 2020-09-14 IMAGING — DX DG FOOT COMPLETE 3+V*L*
2 series · 3 of 3 positions shown · non-contrast
Comparison: [DATE]

CLINICAL DATA: Post left foot surgery.

EXAM:
LEFT FOOT - COMPLETE 3+ VIEW

[Series 1: foot · 0.14mm/px · 2 of 2 slices shown]
[im 1/2]
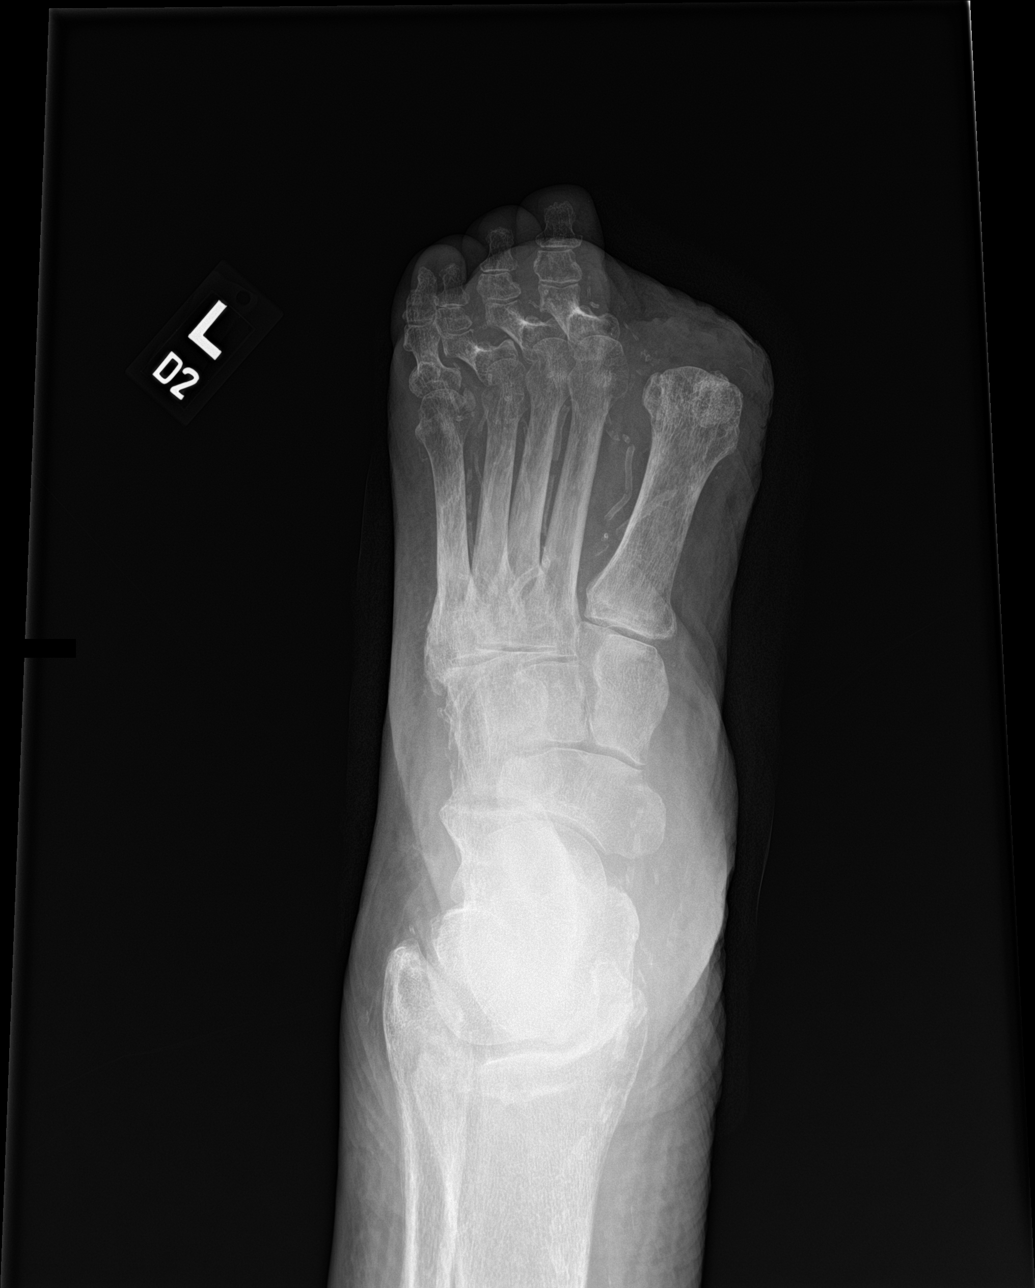
[im 2/2]
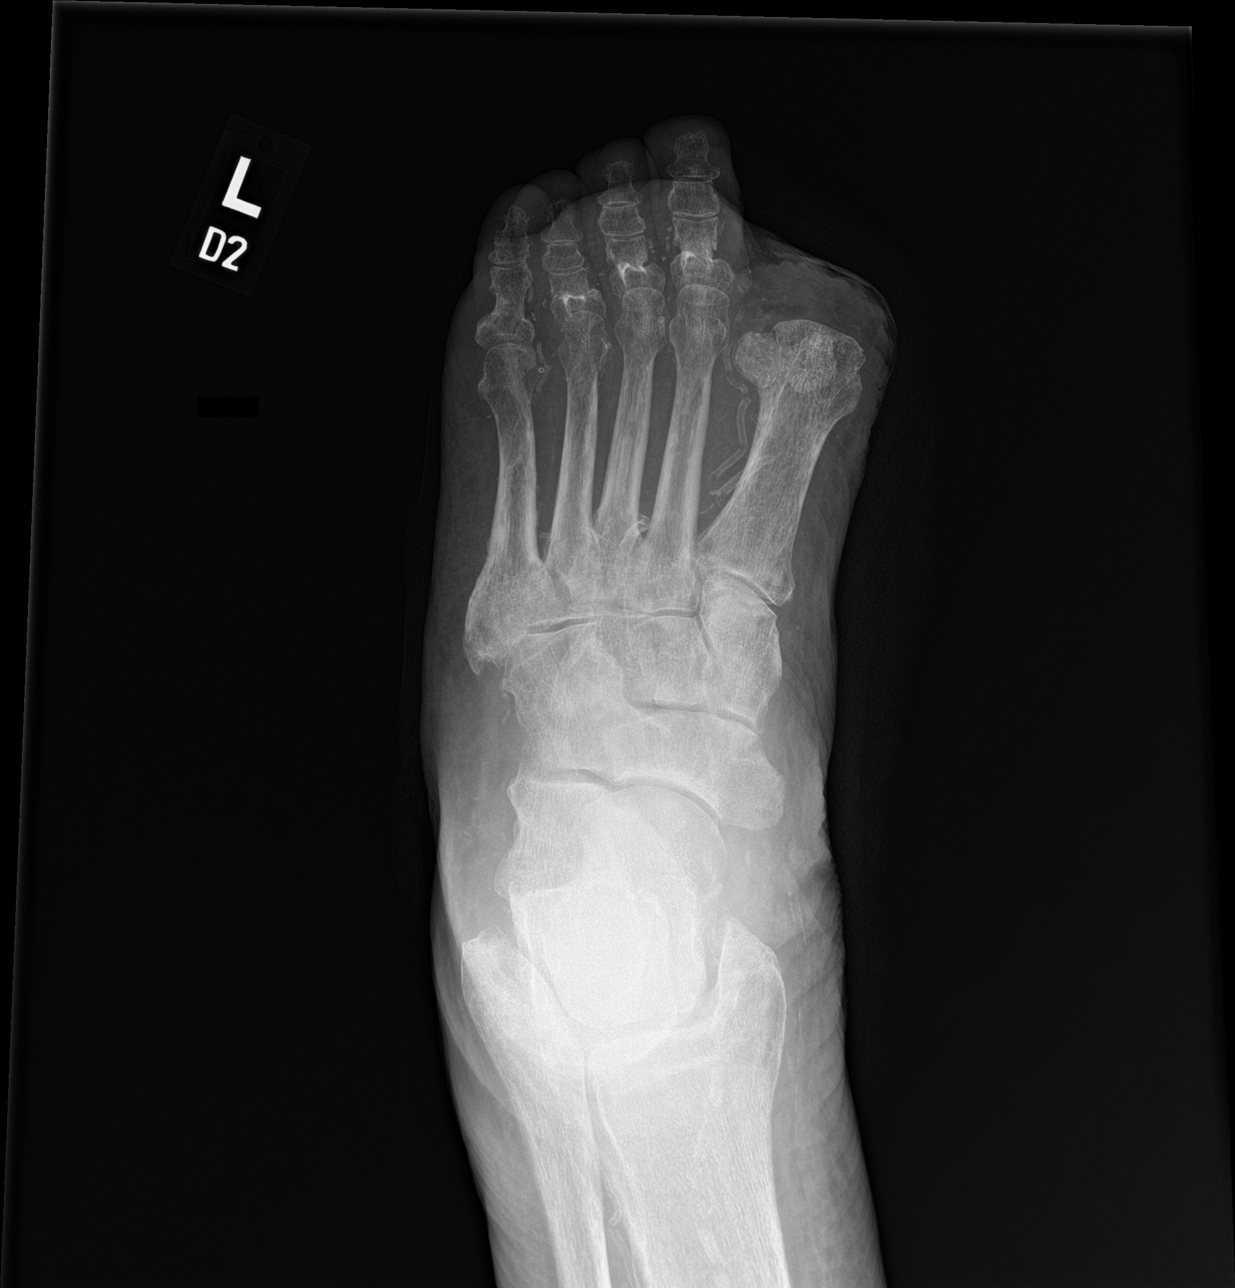

[leg]
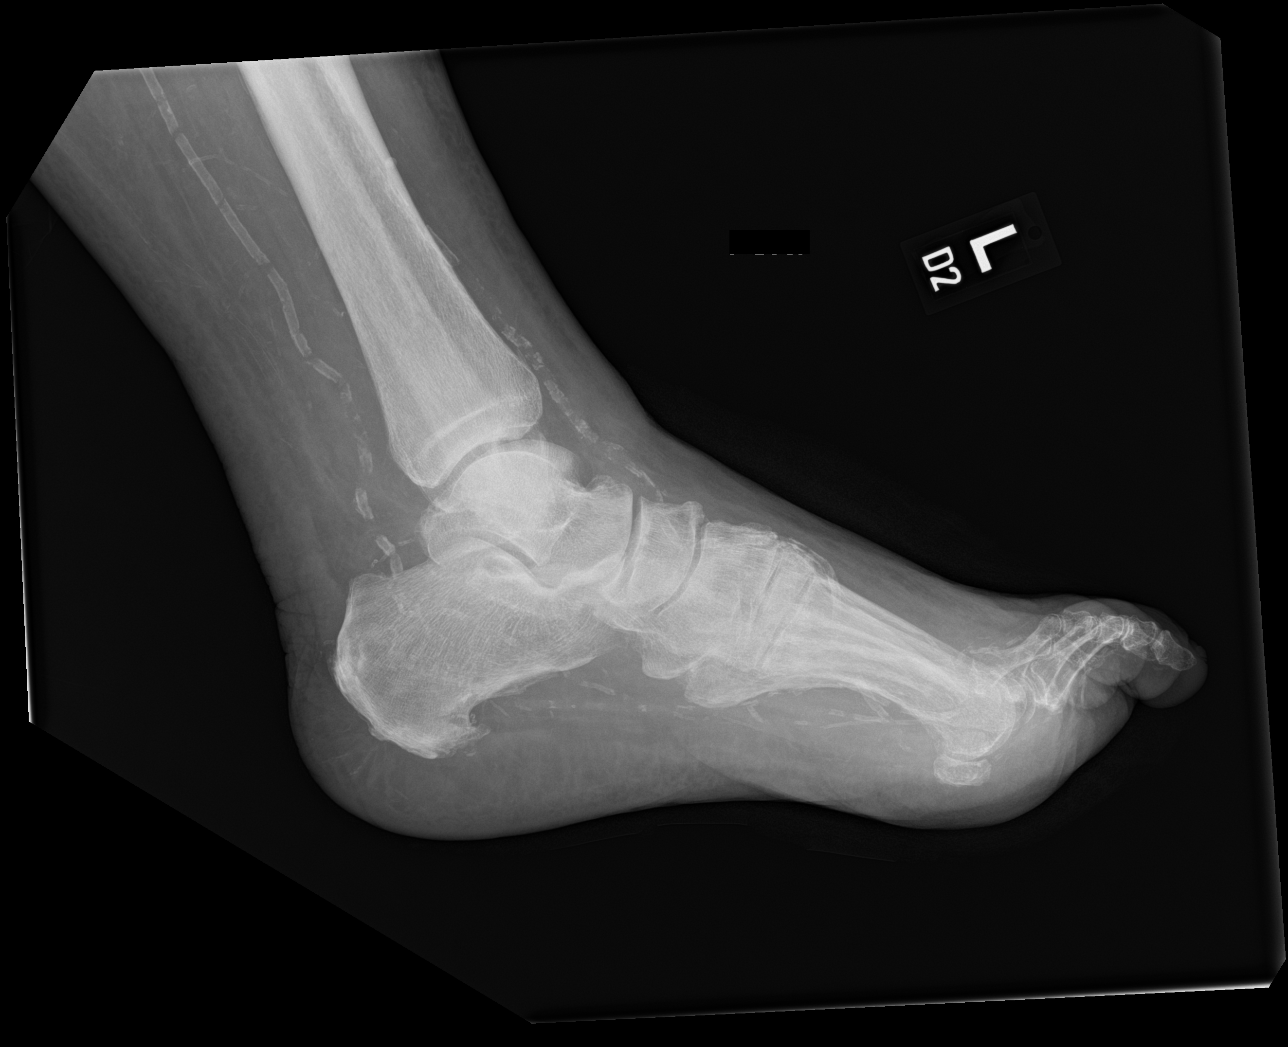

[3 of 3 positions shown; findings below may reference images not displayed]

FINDINGS: Interval postoperative changes with amputation of the first toe at
the metatarsal-phalangeal level. Overlying soft tissues appear
intact. Diffuse bone demineralization. Degenerative changes in the
intertarsal joints. Small plantar calcaneal spur. No acute fracture
or focal bone erosion is suggested. Prominent vascular
calcifications in the soft tissues.
IMPRESSION: Interval amputation of the first toe at the metatarsal-phalangeal
level. Overlying soft tissues appear intact.

## 2020-09-14 SURGERY — AMPUTATION DIGIT
Anesthesia: Monitor Anesthesia Care | Site: Toe | Laterality: Left

## 2020-09-14 SURGERY — MINOR AMPUTATION OF DIGIT
Anesthesia: General | Site: First Toe | Laterality: Left

## 2020-09-14 MED ORDER — LACTATED RINGERS IV SOLN: INTRAVENOUS | Status: DC

## 2020-09-14 MED ORDER — PROPOFOL 10 MG/ML IV BOLUS
INTRAVENOUS | Status: DC | PRN
  Administered 2020-09-14: 20 mg via INTRAVENOUS
  Administered 2020-09-14: 30 mg via INTRAVENOUS

## 2020-09-14 MED ORDER — CHLORHEXIDINE GLUCONATE 0.12 % MT SOLN
OROMUCOSAL | Status: AC
  Administered 2020-09-14: 15 mL via OROMUCOSAL
  Filled 2020-09-14: qty 15

## 2020-09-14 MED ORDER — 0.9 % SODIUM CHLORIDE (POUR BTL) OPTIME
TOPICAL | Status: DC | PRN
  Administered 2020-09-14: 1000 mL

## 2020-09-14 MED ORDER — ONDANSETRON HCL 4 MG/2ML IJ SOLN
INTRAMUSCULAR | Status: DC | PRN
  Administered 2020-09-14: 4 mg via INTRAVENOUS

## 2020-09-14 MED ORDER — PROPOFOL 500 MG/50ML IV EMUL
INTRAVENOUS | Status: DC | PRN
  Administered 2020-09-14: 75 ug/kg/min via INTRAVENOUS

## 2020-09-14 MED ORDER — FENTANYL CITRATE (PF) 100 MCG/2ML IJ SOLN: 25.0000 ug | INTRAMUSCULAR | Status: DC | PRN

## 2020-09-14 MED ORDER — CHLORHEXIDINE GLUCONATE 0.12 % MT SOLN: 15.0000 mL | Freq: Once | OROMUCOSAL | Status: AC

## 2020-09-14 MED ORDER — ACETAMINOPHEN 500 MG PO TABS
1000.0000 mg | ORAL_TABLET | Freq: Once | ORAL | Status: AC
  Administered 2020-09-14: 1000 mg via ORAL
  Filled 2020-09-14: qty 2

## 2020-09-14 MED ORDER — FENTANYL CITRATE (PF) 100 MCG/2ML IJ SOLN
INTRAMUSCULAR | Status: AC
  Administered 2020-09-14: 50 ug via INTRAVENOUS
  Filled 2020-09-14: qty 2

## 2020-09-14 MED ORDER — FENTANYL CITRATE (PF) 100 MCG/2ML IJ SOLN: 50.0000 ug | Freq: Once | INTRAMUSCULAR | Status: AC

## 2020-09-14 MED ORDER — LIDOCAINE 2% (20 MG/ML) 5 ML SYRINGE
INTRAMUSCULAR | Status: DC | PRN
  Administered 2020-09-14: 30 mg via INTRAVENOUS

## 2020-09-14 MED ORDER — MUPIROCIN 2 % EX OINT
1.0000 "application " | TOPICAL_OINTMENT | Freq: Two times a day (BID) | CUTANEOUS | Status: DC
  Administered 2020-09-14 – 2020-09-16 (×4): 1 via NASAL
  Filled 2020-09-14 (×4): qty 22

## 2020-09-14 MED ORDER — ORAL CARE MOUTH RINSE: 15.0000 mL | Freq: Once | OROMUCOSAL | Status: AC

## 2020-09-14 MED ORDER — PHENYLEPHRINE 40 MCG/ML (10ML) SYRINGE FOR IV PUSH (FOR BLOOD PRESSURE SUPPORT)
PREFILLED_SYRINGE | INTRAVENOUS | Status: DC | PRN
  Administered 2020-09-14: 80 ug via INTRAVENOUS

## 2020-09-14 MED ORDER — BUPIVACAINE HCL (PF) 0.5 % IJ SOLN
INTRAMUSCULAR | Status: DC | PRN
  Administered 2020-09-14: 30 mL via PERINEURAL

## 2020-09-14 SURGICAL SUPPLY — 38 items
BLADE LONG MED 31X9 (MISCELLANEOUS) IMPLANT
BNDG GAUZE ELAST 4 BULKY (GAUZE/BANDAGES/DRESSINGS) ×1 IMPLANT
CNTNR URN SCR LID CUP LEK RST (MISCELLANEOUS) IMPLANT
CONT SPEC 4OZ STRL OR WHT (MISCELLANEOUS) ×2
COVER SURGICAL LIGHT HANDLE (MISCELLANEOUS) ×2 IMPLANT
CUFF TOURN SGL QUICK 24 (TOURNIQUET CUFF)
CUFF TRNQT CYL 24X4X16.5-23 (TOURNIQUET CUFF) IMPLANT
DRAPE SURG 17X23 STRL (DRAPES) ×2 IMPLANT
DRSG XEROFORM 1X8 (GAUZE/BANDAGES/DRESSINGS) ×1 IMPLANT
GAUZE SPONGE 2X2 8PLY STRL LF (GAUZE/BANDAGES/DRESSINGS) IMPLANT
GAUZE SPONGE 4X4 12PLY STRL (GAUZE/BANDAGES/DRESSINGS) ×1 IMPLANT
GAUZE XEROFORM 1X8 LF (GAUZE/BANDAGES/DRESSINGS) IMPLANT
GLOVE BIOGEL M 7.0 STRL (GLOVE) ×2 IMPLANT
GLOVE BIOGEL PI ORTHO PRO 7.5 (GLOVE) ×1
GLOVE PI ORTHO PRO STRL 7.5 (GLOVE) ×1 IMPLANT
GOWN STRL REUS W/ TWL LRG LVL3 (GOWN DISPOSABLE) ×2 IMPLANT
GOWN STRL REUS W/TWL LRG LVL3 (GOWN DISPOSABLE) ×4
KIT BASIN OR (CUSTOM PROCEDURE TRAY) ×2 IMPLANT
KIT TURNOVER KIT B (KITS) ×2 IMPLANT
NDL HYPO 25GX1X1/2 BEV (NEEDLE) IMPLANT
NEEDLE HYPO 25GX1X1/2 BEV (NEEDLE) IMPLANT
NS IRRIG 1000ML POUR BTL (IV SOLUTION) ×2 IMPLANT
PACK ORTHO EXTREMITY (CUSTOM PROCEDURE TRAY) ×2 IMPLANT
PAD ARMBOARD 7.5X6 YLW CONV (MISCELLANEOUS) ×4 IMPLANT
SOL PREP POV-IOD 4OZ 10% (MISCELLANEOUS) ×4 IMPLANT
SPECIMEN JAR SMALL (MISCELLANEOUS) ×1 IMPLANT
SPONGE GAUZE 2X2 STER 10/PKG (GAUZE/BANDAGES/DRESSINGS)
SUT ETHILON 3 0 PS 1 (SUTURE) ×2 IMPLANT
SUT ETHILON 4 0 PS 2 18 (SUTURE) ×1 IMPLANT
SUT MNCRL AB 3-0 PS2 27 (SUTURE) ×1 IMPLANT
SYR CONTROL 10ML LL (SYRINGE) IMPLANT
TAPE SURG TRANSPORE 1 IN (GAUZE/BANDAGES/DRESSINGS) IMPLANT
TAPE SURGICAL TRANSPORE 1 IN (GAUZE/BANDAGES/DRESSINGS) ×2
TOWEL GREEN STERILE (TOWEL DISPOSABLE) ×2 IMPLANT
TOWEL GREEN STERILE FF (TOWEL DISPOSABLE) ×2 IMPLANT
TUBE CONNECTING 12X1/4 (SUCTIONS) ×1 IMPLANT
UNDERPAD 30X36 HEAVY ABSORB (UNDERPADS AND DIAPERS) ×2 IMPLANT
WATER STERILE IRR 1000ML POUR (IV SOLUTION) ×1 IMPLANT

## 2020-09-14 NOTE — Transfer of Care (Signed)
Immediate Anesthesia Transfer of Care Note  Patient: Micheal Walls  Procedure(s) Performed: AMPUTATION LEFT GREAT TOE (Left Toe)  Patient Location: PACU  Anesthesia Type:MAC and Regional  Level of Consciousness: awake and alert   Airway & Oxygen Therapy: Patient Spontanous Breathing  Post-op Assessment: Report given to RN and Post -op Vital signs reviewed and stable  Post vital signs: Reviewed and stable  Last Vitals:  Vitals Value Taken Time  BP 116/52 09/14/20 1831    Temp    Pulse 73 09/14/20 1830  Resp 22 09/14/20 1830  SpO2 97 % 09/14/20 1830  Vitals shown include unvalidated device data.  Last Pain:  Vitals:   09/14/20 1640  TempSrc:   PainSc: 0-No pain      Patients Stated Pain Goal: 3 (03/47/42 5956)  Complications: No complications documented.

## 2020-09-14 NOTE — Progress Notes (Addendum)
PROGRESS NOTE    Micheal Walls  SPQ:[id number] DOB: [birth year] DOA: 09/11/2020 PCP: [name], [name]    Brief Narrative:  85 y/o male with history of PAD, DM, CKD 3, admitted to the hospital with worsening wound of left great toe and surrounding cellulitis. Seen by vascular surgery and felt to have adequate blood flow. Podiatry consulted for possible amputation. Currently on broad spectrum antibiotics.   Assessment & Plan:   Principal Problem:   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[place]) Active Problems:   CKD (chronic kidney disease), stage III (HCC)   AKI (acute kidney injury) ([place])   Controlled type 2 diabetes mellitus with stable proliferative retinopathy of both eyes, with long-term current use of insulin (HCC)   Diabetic ulcer of left great toe (HCC)   L hallux diabetic wound infection -tissue necrosis noted on left great toe -surrounding erythema is present -currently on broad spectrum antibiotics -Seen by vascular surgery and since patient was recently revascularized, it was felt that his blood flow had been optimized -seen by Dr. Amalia Hailey with plans for toe amputation on 4/4 -discussed with Dr. Linus Salmons and it was felt reasonable to discontinue linezolid and flagyl and start patient on doxycycline in addition to ceftriaxone -can further adjust antibiotics based on intra operative cultures  AKI on CKD3a -creatinine improving with IV fluids -baseline creatinine around 1.2 -continue to monitor -hold ARB and home lasix  HTN -blood pressure currently stable, continue to monitor -continue cardizem  DM2.  -ozempic currently on hold -on SSI -Blood sugars currently stable  Anemia -suspect this is related to chronic disease from his wound and kidney disease -continue to follow hemoglobin -no evidence of bleeding at this time  HLD -continue statin  PAD -recent angioplasty on 08/11/20 with Dr. Trula Slade -continue on asa, plavix   DVT prophylaxis: enoxaparin (LOVENOX) injection  40 mg Start: 09/11/20 2000  Code Status: full code Family Communication: discussed with patient Disposition Plan: Status is: Inpatient  Remains inpatient appropriate because:Inpatient level of care appropriate due to severity of illness   Dispo: The patient is from: Home              Anticipated d/c is to: Home              Patient currently is not medically stable to d/c.   Difficult to place patient No    Consultants:   Podiatry  Vascular surgery  Procedures:     Antimicrobials:   Ceftriaxone 4/1>  Flagyl 4/1> 4/3  Linezolid 4/1>4/3  Doxycycline 4/3>   Subjective: No pain in his toe, no shortness of breath  Objective: Vitals:   09/14/20 1327 09/14/20 1630 09/14/20 1635 09/14/20 1640  BP: 130/63 (!) 175/83 (!) 164/70 (!) 162/61  Pulse: 71 91 (!) 40 81  Resp: $Remo'18 17 20 20  'utGqi$ Temp: 98.3 F (36.8 C)     TempSrc: Oral     SpO2: 98% 100% 100% 100%  Weight:      Height:        Intake/Output Summary (Last 24 hours) at 09/14/2020 1656 Last data filed at 09/14/2020 1554 Gross per 24 hour  Intake 3116.88 ml  Output 2601 ml  Net 515.88 ml   Filed Weights   09/11/20 1540  Weight: 115 kg    Examination:  General exam: Alert, awake, oriented x 3 Respiratory system: Clear to auscultation. Respiratory effort normal. Cardiovascular system:RRR. No murmurs, rubs, gallops. Gastrointestinal system: Abdomen is nondistended, soft and nontender. No organomegaly or masses felt. Normal bowel sounds  heard. Central nervous system: Alert and oriented. No focal neurological deficits. Extremities: R BKA, LLE is warm with darkened skin. L great toe with areas of necrosis Skin: No rashes, lesions or ulcers Psychiatry: Judgement and insight appear normal. Mood & affect appropriate.    Data Reviewed: I have personally reviewed following labs and imaging studies  CBC: Recent Labs  Lab 09/11/20 1800 09/12/20 0513 09/14/20 0112  WBC 9.2 7.2 7.8  NEUTROABS 6.8  --   --    HGB 9.1* 9.4* 8.6*  HCT 29.0* 29.2* 26.5*  MCV 94.5 93.3 95.3  PLT 249 252 614   Basic Metabolic Panel: Recent Labs  Lab 09/11/20 1800 09/12/20 0513 09/13/20 0129 09/14/20 0112  NA 135 136 137 137  K 4.5 4.0 4.5 5.1  CL 103 105 107 108  CO2 $Re'24 24 25 25  'vMo$ GLUCOSE 116* 101* 91 115*  BUN 76* 63* 51* 44*  CREATININE 2.84* 2.26* 1.84* 1.69*  CALCIUM 9.6 9.5 9.5 9.6   GFR: Estimated Creatinine Clearance: 44.8 mL/min (A) (by C-G formula based on SCr of 1.69 mg/dL (H)). Liver Function Tests: No results for input(s): AST, ALT, ALKPHOS, BILITOT, PROT, ALBUMIN in the last 168 hours. No results for input(s): LIPASE, AMYLASE in the last 168 hours. No results for input(s): AMMONIA in the last 168 hours. Coagulation Profile: No results for input(s): INR, PROTIME in the last 168 hours. Cardiac Enzymes: No results for input(s): CKTOTAL, CKMB, CKMBINDEX, TROPONINI in the last 168 hours. BNP (last 3 results) No results for input(s): PROBNP in the last 8760 hours. HbA1C: No results for input(s): HGBA1C in the last 72 hours. CBG: Recent Labs  Lab 09/13/20 1718 09/13/20 2116 09/14/20 0748 09/14/20 1140 09/14/20 1552  GLUCAP 109* 147* 104* 110* 103*   Lipid Profile: No results for input(s): CHOL, HDL, LDLCALC, TRIG, CHOLHDL, LDLDIRECT in the last 72 hours. Thyroid Function Tests: No results for input(s): TSH, T4TOTAL, FREET4, T3FREE, THYROIDAB in the last 72 hours. Anemia Panel: No results for input(s): VITAMINB12, FOLATE, FERRITIN, TIBC, IRON, RETICCTPCT in the last 72 hours. Sepsis Labs: Recent Labs  Lab 09/11/20 1800 09/11/20 2010  LATICACIDVEN 0.7 0.9    Recent Results (from the past 240 hour(s))  Resp Panel by RT-PCR (Flu A&B, Covid) Nasopharyngeal Swab     Status: None   Collection Time: 09/11/20  5:13 PM   Specimen: Nasopharyngeal Swab; Nasopharyngeal(NP) swabs in vial transport medium  Result Value Ref Range Status   SARS Coronavirus 2 by RT PCR NEGATIVE NEGATIVE  Final    Comment: (NOTE) SARS-CoV-2 target nucleic acids are NOT DETECTED.  The SARS-CoV-2 RNA is generally detectable in upper respiratory specimens during the acute phase of infection. The lowest concentration of SARS-CoV-2 viral copies this assay can detect is 138 copies/mL. A negative result does not preclude SARS-Cov-2 infection and should not be used as the sole basis for treatment or other patient management decisions. A negative result may occur with  improper specimen collection/handling, submission of specimen other than nasopharyngeal swab, presence of viral mutation(s) within the areas targeted by this assay, and inadequate number of viral copies(<138 copies/mL). A negative result must be combined with clinical observations, patient history, and epidemiological information. The expected result is Negative.  Fact Sheet for Patients:  EntrepreneurPulse.com.au  Fact Sheet for Healthcare Providers:  IncredibleEmployment.be  This test is no t yet approved or cleared by the [place] FDA and  has been authorized for detection and/or diagnosis of SARS-CoV-2 by FDA under an Emergency Use  Authorization (EUA). This EUA will remain  in effect (meaning this test can be used) for the duration of the COVID-19 declaration under Section 564(b)(1) of the Act, 21 U.S.C.section 360bbb-3(b)(1), unless the authorization is terminated  or revoked sooner.       Influenza A by PCR NEGATIVE NEGATIVE Final   Influenza B by PCR NEGATIVE NEGATIVE Final    Comment: (NOTE) The Xpert Xpress SARS-CoV-2/FLU/RSV plus assay is intended as an aid in the diagnosis of influenza from Nasopharyngeal swab specimens and should not be used as a sole basis for treatment. Nasal washings and aspirates are unacceptable for Xpert Xpress SARS-CoV-2/FLU/RSV testing.  Fact Sheet for Patients: EntrepreneurPulse.com.au  Fact Sheet for Healthcare  Providers: IncredibleEmployment.be  This test is not yet approved or cleared by the [place] FDA and has been authorized for detection and/or diagnosis of SARS-CoV-2 by FDA under an Emergency Use Authorization (EUA). This EUA will remain in effect (meaning this test can be used) for the duration of the COVID-19 declaration under Section 564(b)(1) of the Act, 21 U.S.C. section 360bbb-3(b)(1), unless the authorization is terminated or revoked.  Performed at [facility], [place] [address] [place], [place] [address]   Blood culture (routine x 2)     Status: None (Preliminary result)   Collection Time: 09/11/20  5:25 PM   Specimen: BLOOD  Result Value Ref Range Status   Specimen Description BLOOD LEFT ANTECUBITAL  Final   Special Requests   Final    BOTTLES DRAWN AEROBIC AND ANAEROBIC Blood Culture adequate volume   Culture   Final    NO GROWTH 2 DAYS Performed at [facility], [place] [address] [place], [place] [address]    Report Status PENDING  Incomplete  Blood culture (routine x 2)     Status: None (Preliminary result)   Collection Time: 09/11/20  6:00 PM   Specimen: BLOOD LEFT HAND  Result Value Ref Range Status   Specimen Description BLOOD LEFT HAND  Final   Special Requests   Final    BOTTLES DRAWN AEROBIC AND ANAEROBIC Blood Culture adequate volume   Culture   Final    NO GROWTH 2 DAYS Performed at [facility], [place] [address] [place],  [address]    Report Status PENDING  Incomplete         Radiology Studies: No results found.      Scheduled Meds: . [MAR Hold] allopurinol  100 mg Oral Daily  . [MAR Hold] aspirin EC  81 mg Oral Daily  . [MAR Hold] clopidogrel  75 mg Oral Daily  . [MAR Hold] diltiazem  300 mg Oral Daily  . [MAR Hold] dorzolamide  1 drop Both Eyes Daily  . [MAR Hold] doxycycline  100 mg Oral Q12H  . [MAR Hold] enoxaparin (LOVENOX) injection  40 mg Subcutaneous Q24H  . [MAR Hold] ferrous sulfate   325 mg Oral Q M,W,F  . [MAR Hold] gabapentin  100 mg Oral 2 times per day  . [MAR Hold] gabapentin  200 mg Oral Daily  . [MAR Hold] insulin aspart  0-9 Units Subcutaneous TID WC  . [MAR Hold] latanoprost  1 drop Both Eyes Daily  . [MAR Hold] multivitamin with minerals  1 tablet Oral Daily  . [MAR Hold] mupirocin ointment  1 application Nasal BID  . [MAR Hold] nutrition supplement (JUVEN)  1 packet Oral BID BM  . [MAR Hold] omega-3 acid ethyl esters  2 g Oral Daily  . [facility]  Hold] pantoprazole  40 mg Oral QHS  . [MAR Hold] rosuvastatin  10 mg Oral Q7 days  . [MAR Hold] tamsulosin  0.4 mg Oral Daily   Continuous Infusions: . [MAR Hold] cefTRIAXone (ROCEPHIN)  IV 2 g (09/13/20 2147)  . lactated ringers 20 mL/hr at 09/14/20 0835  . lactated ringers 10 mL/hr at 09/14/20 1639     LOS: 3 days    Time spent: 37mins    Kathie Dike, MD [facility]   If 7PM-7AM, please contact night-coverage [email or web address]  09/14/2020, 4:56 PM

## 2020-09-14 NOTE — Op Note (Signed)
Patient Name: Micheal Walls DOB: [birth year]  MRN: [id number]   Date of Service: 09/11/2020 - 09/14/2020  Surgeon: Dr. [name], DPM Assistants: None Pre-operative Diagnosis:  Gangrene left hallux Post-operative Diagnosis:  Osteomyelitis left hallux Procedures:  1) amputation toe metatarsophalangeal joint left hallux Pathology/Specimens: ID Type Source Tests Collected by Time Destination  1 : Left Hallux Amputation Toe, Left SURGICAL PATHOLOGY [name], DPM 09/14/2020 1755   A : Left Hallux Bone for Culture Amputation Toe, Left AEROBIC/ANAEROBIC CULTURE W GRAM STAIN (SURGICAL/DEEP WOUND) [name], DPM 09/14/2020 1757    Anesthesia: Monitored anesthesia care Hemostasis: * No tourniquets in log * Estimated Blood Loss: 10 cc Materials: * No implants in log * Medications: None Complications: None  Indications for Procedure:  This is a 85 y.o. male with a history of type 2 diabetes and peripheral vascular disease, he is a chronic osteomyelitis for several months that has been treated nonoperatively with antibiotics.  He recently underwent revascularization during last admission.  He presented with worsening wound appearance and purulence draining from his wound.  Elected for operative intervention and amputation.   Procedure in Detail: Patient was identified in pre-operative holding area. Formal consent was signed and the left lower extremity was marked.  He underwent regional block by the anesthesia team patient was brought back to the operating room. Anesthesia was induced. The extremity was prepped and draped in the usual sterile fashion. Timeout was taken to confirm patient name, laterality, and procedure prior to incision.   Attention was then directed to the left first toe where an incision was made in a fishmouth style incision. Dissection was carried down to level of bone.  Dissection was continued to the metatarsophalangeal joint joint and all collateral ligaments  were freed at the joint.  The bone soft tissue attachments of the proximal phalanx were removed and passed for pathology.    A bone culture was sent for microbiology from the distal phalanx from this.  The remaining metatarsal head appeared healthy and viable.  The area was copiously irrigated.  The skin was reapproximated with 3-0 Monocryl and 3-0 nylon.   The foot was then dressed with Xeroform, 4 x 4's, Kerlix and tape. Patient tolerated the procedure well.   Disposition: Following a period of post-operative monitoring, patient will be transferred back to the floor.

## 2020-09-14 NOTE — Plan of Care (Signed)
  Problem: Education: Goal: Knowledge of General Education information will improve Description Including pain rating scale, medication(s)/side effects and non-pharmacologic comfort measures Outcome: Progressing   

## 2020-09-14 NOTE — Anesthesia Procedure Notes (Addendum)
Procedure Name: MAC Date/Time: 09/14/2020 12:38 PM Performed by: Reece Agar, CRNA Pre-anesthesia Checklist: Patient identified, Emergency Drugs available, Suction available and Patient being monitored Patient Re-evaluated:Patient Re-evaluated prior to induction Oxygen Delivery Method: Simple face mask

## 2020-09-14 NOTE — Progress Notes (Signed)
LVM for Dr. Thomasene Lot to inquire about the patient taking Plavix $RemoveBefor'75mg'[id number]$  prior to surgery. Awaiting for further instructions.

## 2020-09-14 NOTE — Consult Note (Signed)
PV Navigator consult noted and chart reviewed. Patient evaluated  by vascular and podiatry services and underwent left great toe amputation today with Dr Sherryle Lis today. Will continue to follow and offer assistance as care transitions or as any needs are identified.   Thank you.  Cletis Media RN BSN CWS CLI [place] [phone].LIMB

## 2020-09-14 NOTE — Anesthesia Preprocedure Evaluation (Addendum)
Anesthesia Evaluation  Patient identified by MRN, date of birth, ID band Patient awake    Reviewed: Allergy & Precautions, NPO status , Patient's Chart, lab work & pertinent test results  History of Anesthesia Complications Negative for: history of anesthetic complications  Airway Mallampati: II  TM Distance: >3 FB Neck ROM: Full    Dental  (+) Missing,    Pulmonary sleep apnea and Continuous Positive Airway Pressure Ventilation , former smoker,    Pulmonary exam normal        Cardiovascular hypertension, Pt. on medications + Peripheral Vascular Disease  Normal cardiovascular exam     Neuro/Psych CVA (2002)    GI/Hepatic GERD  Medicated and Controlled,  Endo/Other  diabetes, Type 2  Renal/GU Renal InsufficiencyRenal disease     Musculoskeletal   Abdominal   Peds  Hematology  (+) anemia , Hgb 8.6   Anesthesia Other Findings   Reproductive/Obstetrics                            Anesthesia Physical Anesthesia Plan  ASA: III  Anesthesia Plan: MAC and Regional   Post-op Pain Management:    Induction:   PONV Risk Score and Plan: Treatment may vary due to age or medical condition, Propofol infusion and Ondansetron  Airway Management Planned: Natural Airway and Simple Face Mask  Additional Equipment: None  Intra-op Plan:   Post-operative Plan:   Informed Consent: I have reviewed the patients History and Physical, chart, labs and discussed the procedure including the risks, benefits and alternatives for the proposed anesthesia with the patient or authorized representative who has indicated his/her understanding and acceptance.       Plan Discussed with: CRNA  Anesthesia Plan Comments:        Anesthesia Quick Evaluation

## 2020-09-14 NOTE — Plan of Care (Signed)
Good perfusion intraoperatively. No further surgical plans from my standpoint. OK to discharge WBAT in surgical shoe. Would plan for no more than another 2 weeks of oral antibiotics, culture was taken and will follow for narrowing of abx selection. OK to discharge from a podiatric perspective

## 2020-09-14 NOTE — Brief Op Note (Signed)
09/14/2020  6:23 PM  PATIENT:  Micheal Walls  85 y.o. male  PRE-OPERATIVE DIAGNOSIS:  Left great toe diabetic ulcer  POST-OPERATIVE DIAGNOSIS:  Osteomyelitis left great toe  PROCEDURE:  Procedure(s): AMPUTATION LEFT GREAT TOE (Left)  SURGEON:  Surgeon(s) and Role:    * [place] Dowd, Stephan Minister, DPM - Primary    ASSISTANTS: none   ANESTHESIA:   regional and MAC  EBL: 10 cc  BLOOD ADMINISTERED:none  DRAINS: none   LOCAL MEDICATIONS USED:  NONE  SPECIMEN:  Source of Specimen:  Left hallux  DISPOSITION OF SPECIMEN:  Bone culture and pathology  COUNTS:  YES  TOURNIQUET:  * No tourniquets in log *  DICTATION: .Note written in EPIC  PLAN OF CARE: Admit to inpatient   PATIENT DISPOSITION:  PACU - hemodynamically stable.   Delay start of Pharmacological VTE agent (>24hrs) due to surgical blood loss or risk of bleeding: no

## 2020-09-14 NOTE — H&P (Signed)
History and Physical Interval Note:  09/14/2020 5:26 PM  Micheal Walls  has presented today for surgery, with the diagnosis of gangrene left hallux.  The various methods of treatment have been discussed with the patient and family. After consideration of risks, benefits and other options for treatment, the patient has consented to   Procedure(s): AMPUTATION LEFT GREAT TOE (Left) as a surgical intervention.  The patient's history has been reviewed, patient examined, no change in status, stable for surgery.  I have reviewed the patient's chart and labs.  Questions were answered to the patient's satisfaction.     [name]

## 2020-09-14 NOTE — Care Management Important Message (Signed)
Important Message  Patient Details  Name: Micheal Walls MRN: [id number] Date of Birth: 1936-03-02   Medicare Important Message Given:  Yes     Orbie Pyo 09/14/2020, 2:07 PM

## 2020-09-14 NOTE — Anesthesia Procedure Notes (Signed)
Anesthesia Regional Block: Ankle block   Pre-Anesthetic Checklist: ,, timeout performed, Correct Patient, Correct Site, Correct Laterality, Correct Procedure, Correct Position, site marked, Risks and benefits discussed, pre-op evaluation,  At surgeon's request and post-op pain management  Laterality: Left  Prep: Maximum Sterile Barrier Precautions used, chloraprep       Needles:  Injection technique: Single-shot  Needle Type: Echogenic Needle     Needle Length: 4cm  Needle Gauge: 25     Additional Needles:   Narrative:  Start time: 09/14/2020 4:27 PM End time: 09/14/2020 4:30 PM  Performed by: Personally  Anesthesiologist: Brennan Bailey, MD  Additional Notes: Risks, benefits, and alternative discussed. Patient gave consent for procedure. Patient prepped and draped in sterile fashion. Sedation administered, patient remains easily responsive to voice. Local anesthetic given in 5cc increments with no signs or symptoms of intravascular injection. No pain or paraesthesias with injection. Patient monitored throughout procedure with signs of LAST or immediate complications. Tolerated well.   Tawny Asal, MD

## 2020-09-15 ENCOUNTER — Telehealth: Payer: Self-pay | Admitting: Podiatry

## 2020-09-15 ENCOUNTER — Ambulatory Visit: Payer: Medicare Other | Admitting: Nurse Practitioner

## 2020-09-15 ENCOUNTER — Encounter (HOSPITAL_COMMUNITY): Payer: Self-pay | Admitting: Podiatry

## 2020-09-15 ENCOUNTER — Ambulatory Visit: Payer: Medicare Other | Admitting: Podiatry

## 2020-09-15 LAB — CBC
HCT: 28.1 % — ABNORMAL LOW (ref 39.0–52.0)
Hemoglobin: 9 g/dL — ABNORMAL LOW (ref 13.0–17.0)
MCH: 30.6 pg (ref 26.0–34.0)
MCHC: 32 g/dL (ref 30.0–36.0)
MCV: 95.6 fL (ref 80.0–100.0)
Platelets: 268 10*3/uL (ref 150–400)
RBC: 2.94 MIL/uL — ABNORMAL LOW (ref 4.22–5.81)
RDW: 15.9 % — ABNORMAL HIGH (ref 11.5–15.5)
WBC: 6 10*3/uL (ref 4.0–10.5)
nRBC: 0 % (ref 0.0–0.2)

## 2020-09-15 LAB — BASIC METABOLIC PANEL
Anion gap: 5 (ref 5–15)
BUN: 35 mg/dL — ABNORMAL HIGH (ref 8–23)
CO2: 25 mmol/L (ref 22–32)
Calcium: 9.9 mg/dL (ref 8.9–10.3)
Chloride: 108 mmol/L (ref 98–111)
Creatinine, Ser: 1.64 mg/dL — ABNORMAL HIGH (ref 0.61–1.24)
GFR, Estimated: 42 mL/min — ABNORMAL LOW (ref >60)
Glucose, Bld: 101 mg/dL — ABNORMAL HIGH (ref 70–99)
Potassium: 4.7 mmol/L (ref 3.5–5.1)
Sodium: 138 mmol/L (ref 135–145)

## 2020-09-15 LAB — GLUCOSE, CAPILLARY
Glucose-Capillary: 100 mg/dL — ABNORMAL HIGH (ref 70–99)
Glucose-Capillary: 123 mg/dL — ABNORMAL HIGH (ref 70–99)
Glucose-Capillary: 116 mg/dL — ABNORMAL HIGH (ref 70–99)
Glucose-Capillary: 161 mg/dL — ABNORMAL HIGH (ref 70–99)

## 2020-09-15 LAB — SURGICAL PCR SCREEN
MRSA, PCR: NEGATIVE
Staphylococcus aureus: NEGATIVE

## 2020-09-15 MED ORDER — DOXYCYCLINE HYCLATE 100 MG PO TABS: 100.0000 mg | ORAL_TABLET | Freq: Two times a day (BID) | ORAL | 0 refills | Status: DC

## 2020-09-15 MED ORDER — AMOXICILLIN-POT CLAVULANATE 875-125 MG PO TABS
1.0000 | ORAL_TABLET | Freq: Two times a day (BID) | ORAL | Status: DC
  Administered 2020-09-15 – 2020-09-16 (×2): 1 via ORAL
  Filled 2020-09-15 (×2): qty 1

## 2020-09-15 MED ORDER — HYDROCODONE-ACETAMINOPHEN 5-325 MG PO TABS: 1.0000 | ORAL_TABLET | Freq: Four times a day (QID) | ORAL | 0 refills | Status: DC | PRN

## 2020-09-15 MED ORDER — FENTANYL CITRATE (PF) 100 MCG/2ML IJ SOLN
12.5000 ug | INTRAMUSCULAR | Status: AC | PRN
  Administered 2020-09-15 (×2): 25 ug via INTRAVENOUS
  Filled 2020-09-15 (×2): qty 2

## 2020-09-15 MED ORDER — AMOXICILLIN-POT CLAVULANATE 875-125 MG PO TABS: 1.0000 | ORAL_TABLET | Freq: Two times a day (BID) | ORAL | 0 refills | Status: DC

## 2020-09-15 NOTE — Progress Notes (Signed)
Placed patient on CPAP for the night with pressure set at 8cm

## 2020-09-15 NOTE — Telephone Encounter (Signed)
Patients daughter called and wanted to know when Dr. Sherryle Lis was going to call today. She wanted Dr. Sherryle Lis to know that her phone does not accept robo calls and that he would need to text first to let her know its the provider calling.

## 2020-09-15 NOTE — Telephone Encounter (Signed)
Spoke with her, reviewed plan of care and addressed all questions

## 2020-09-15 NOTE — Evaluation (Signed)
Physical Therapy Evaluation Patient Details Name: Micheal Walls MRN: [id number] DOB: Jul 01, 1935 Today's Date: 09/15/2020   History of Present Illness  Patient is a 85 y/o male who presents on 09/12/20 with left diabetic great toe wound s/p amputation 09/14/20. PMH includes HTN, CKD stage III, chronid diastolic CHF, DM, CVA, diabetic neuropathy.  Clinical Impression  Patient presents with generalized weakness, decreased sensation, impaired balance, pain and impaired mobility s/p above. Pt lives at home with wife and grandson and uses power w/c for household mobility and manual w/c for community ambulation. Pt reports minimal walking with standard walker at home and does own ADLS. Son and grandson help with driving. Today, pt requires Min A for bed mobility, transfers and gait training with use of RW for support. Pt has an adjustable bed at home and elevated power chair seat and uses furniture to help pull up. Limited walking tolerance today due to weakness in BLEs. Education on post op shoe, elevation etc. Would recommend 1 more night to increase mobility/strength while in the hospital to prepare pt for d/c home. MD notified. Will follow acutely to maximize independence and mobility prior to return home.    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 Has  adjustable bed at home.    Transfers Overall transfer level: Needs assistance Equipment used: Rolling walker (2 wheeled) Transfers: Sit to/from Stand Sit to Stand: Min assist;From elevated surface         General transfer comment: Assist to power to standing from elevated bed height, transferred to chair post ambulation.  Ambulation/Gait Ambulation/Gait assistance: Min assist Gait Distance (Feet): 4 Feet Assistive device: Rolling walker (2 wheeled) Gait Pattern/deviations: Step-to pattern;Trunk flexed;Decreased step length - right;Decreased step length - left;Decreased stance time - left;Decreased weight shift to left Gait velocity: decreased   General Gait Details: Slow, unsteady gait with difficulty advancing LLE needing assist initially, left knee instability, heavy reliance on UEs.  Stairs            Wheelchair Mobility    Modified Rankin (Stroke Patients Only)       Balance Overall balance assessment: Needs assistance Sitting-balance support: Feet supported;No upper extremity supported Sitting balance-Leahy Scale: Good Sitting balance - Comments: Able to donn prosthesis without assist/difficulty.   Standing balance support: During functional activity Standing balance-Leahy Scale: Poor Standing balance comment: Requires UE support and external support                             Pertinent Vitals/Pain Pain Assessment: Faces Faces Pain Scale: Hurts little more Pain Location: left foot Pain Descriptors / Indicators: Throbbing Pain Intervention(s): Monitored during session;Repositioned    Home Living Family/patient expects to be discharged to:: Private residence Living Arrangements: Spouse/significant other;Other relatives (grand son) Available Help at Discharge: Family;Available PRN/intermittently Type of Home: House Home Access: Ramped  entrance     Home Layout: One level Home Equipment: Wheelchair - Insurance claims handler -  standard;Wheelchair - power Additional Comments: pt lives with wife who can provide supervision and grandson who drives them to morning appts but then goes to work    Prior Function Level of Independence: Independent with assistive device(s)         Comments: Patient uses  power w/c for household  distances and manual w/c for community mobility. Can self-propel. Can ambulate to bathroom with use of standard walker. Has not been putting much weight on LLE 2/2 diabetic ulcer.  Son helps with transportation. Does some IADLs.     Hand Dominance   Dominant Hand: Right    Extremity/Trunk Assessment   Upper Extremity Assessment Upper Extremity Assessment: Defer to OT evaluation    Lower Extremity Assessment Lower Extremity Assessment: RLE deficits/detail;LLE deficits/detail RLE Deficits / Details: Rt BKA RLE Sensation: decreased light touch LLE Sensation: decreased light touch       Communication   Communication: HOH  Cognition Arousal/Alertness: Awake/alert Behavior During Therapy: WFL for tasks assessed/performed Overall Cognitive Status: Within Functional Limits for tasks assessed                                 General Comments: for basic mobility tasks. Awareness that he is likely not ready to d/c home today.      General Comments      Exercises     Assessment/Plan    PT Assessment Patient needs continued PT services  PT Problem List Decreased strength;Decreased mobility;Pain;Impaired sensation;Decreased balance;Decreased skin integrity       PT Treatment Interventions Therapeutic exercise;Gait training;Wheelchair mobility training;Balance training;Patient/family education;Therapeutic activities;Functional mobility training    PT Goals (Current goals can be found in the Care Plan section)  Acute Rehab PT Goals Patient Stated Goal: to get stronger and go home PT Goal Formulation: With patient Time For Goal Achievement: 09/29/20 Potential to  Achieve Goals: Good    Frequency Min 3X/week   Barriers to discharge Decreased caregiver support      Co-evaluation               AM-PAC PT "6 Clicks" Mobility  Outcome Measure Help needed turning from your back to your side while in a flat bed without using bedrails?: A Little Help needed moving from lying on your back to sitting on the side of a flat bed without using bedrails?: A Little Help needed moving to and from a bed to a chair (including a wheelchair)?: A Lot Help needed standing up from a chair using your arms (e.g., wheelchair or bedside chair)?: A Lot Help needed to walk in hospital room?: A Little Help needed climbing 3-5 steps with a railing? : Total 6 Click Score: 14    End of Session Equipment Utilized During Treatment: Gait belt Activity Tolerance: Patient tolerated treatment well Patient left: in chair;with call bell/phone within reach Nurse Communication: Mobility status;Other (comment) (transfer technique back to bed) PT Visit Diagnosis: Pain;Muscle weakness (generalized) (M62.81);Other abnormalities of gait and mobility (R26.89) Pain - Right/Left: Left Pain - part of body: Ankle and joints of foot    Time: 1320-1345 PT Time Calculation (min) (ACUTE ONLY): 25 min   Charges:   PT Evaluation $PT Eval Moderate Complexity: 1 Mod PT Treatments $Therapeutic Activity: 8-22 mins        Marisa Severin, PT, DPT Acute Rehabilitation Services Pager [phone] Office [phone]  Marguarite Arbour A Tadao Emig 09/15/2020, 2:53 PM

## 2020-09-15 NOTE — Progress Notes (Signed)
Orthopedic Tech Progress Note Patient Details:  Micheal Walls 07-17-1935 [id number]  Ortho Devices Type of Ortho Device: Postop shoe/boot Ortho Device/Splint Location: LLE Ortho Device/Splint Interventions: Ordered,Application,Adjustment   Post Interventions Patient Tolerated: Well Instructions Provided: Poper ambulation with device,Care of device   Janit Pagan 09/15/2020, 9:11 AM

## 2020-09-15 NOTE — Anesthesia Postprocedure Evaluation (Signed)
Anesthesia Post Note  Patient: Micheal Walls  Procedure(s) Performed: AMPUTATION LEFT GREAT TOE (Left Toe)     Patient location during evaluation: PACU Anesthesia Type: Regional Level of consciousness: awake and alert Pain management: pain level controlled Vital Signs Assessment: post-procedure vital signs reviewed and stable Respiratory status: spontaneous breathing, nonlabored ventilation, respiratory function stable and patient connected to nasal cannula oxygen Cardiovascular status: stable and blood pressure returned to baseline Postop Assessment: no apparent nausea or vomiting Anesthetic complications: no   No complications documented.  Last Vitals:  Vitals:   09/14/20 2355 09/14/20 2358  BP:  121/74  Pulse: 82 77  Resp: 18 16  Temp:  36.7 C  SpO2: 96% 97%    Last Pain:  Vitals:   09/14/20 2358  TempSrc: Oral  PainSc: 0-No pain                 Nazim Kadlec COKER

## 2020-09-15 NOTE — Discharge Summary (Addendum)
Physician Discharge Summary  RYDER MAN CWC:[id number] DOB: 02-21-1936 DOA: 09/11/2020  PCP: [name], [name]  Admit date: 09/11/2020 Discharge date: 09/16/2020  Admitted From: home Disposition:  home  Recommendations for Outpatient Follow-up:  1. Follow up with PCP in 1-2 weeks 2. Please obtain BMP/CBC in one week 3. Follow up with podiatry scheduled for 4/19  Home Health: home health PT/OT, RN Equipment/Devices:  Discharge Condition:stable CODE STATUS:full code Diet recommendation: heart healthy, carb modified  Brief/Interim Summary: 85 y/o male with history of PAD, DM, CKD 3, admitted to the hospital with worsening wound of left great toe and surrounding cellulitis. Seen by vascular surgery and felt to have adequate blood flow. Podiatry consulted for and he underwent amputation of left great toe. He is on antibiotics  Discharge Diagnoses:  Principal Problem:   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[place]) Active Problems:   CKD (chronic kidney disease), stage III (HCC)   AKI (acute kidney injury) ([place])   Controlled type 2 diabetes mellitus with stable proliferative retinopathy of both eyes, with long-term current use of insulin (HCC)   Diabetic ulcer of left great toe (HCC)  L hallux diabetic wound infection -tissue necrosis noted on left great toe -surrounding erythema is present -currently on broad spectrum antibiotics -Seen by vascular surgery and since patient was recently revascularized, it was felt that his blood flow had been optimized -seen by Dr. Sherryle Lis and underwent amputation on 4/4 -discussed with Dr. Linus Salmons and it was felt reasonable to discontinue linezolid and flagyl and start patient on doxycycline in addition to ceftriaxone -on discharge, he will be sent home on doxycycline and augmentin -Dr. Sherryle Lis will follow up intra operative cultures and adjust antibiotics accordingly -dressing that is applied at the time of discharge will be kept in place until follow up  with podiatry. He will not need daily dressing changes at home per podiatry  AKI on CKD3a -creatinine improving with IV fluids -baseline creatinine around 1.2 -continue to hold ARB on discharge -restart lasix on discharge  HTN -blood pressure currently stable, continue to monitor -continue cardizem  DM2.  -resume ozempic on discharge -Blood sugars currently stable  Anemia -suspect this is related to chronic disease from his wound and kidney disease -continue to follow hemoglobin -no evidence of bleeding at this time  HLD -continue statin  PAD -recent angioplasty on 08/11/20 with Dr. Trula Slade -continue on asa, plavix  Discharge Instructions  Discharge Instructions    Face-to-face encounter (required for Medicare/Medicaid patients)   Complete by: As directed    I Kathie Dike certify that this patient is under my care and that I, or a nurse practitioner or physician's assistant working with me, had a face-to-face encounter that meets the physician face-to-face encounter requirements with this patient on 09/15/2020. The encounter with the patient was in whole, or in part for the following medical condition(s) which is the primary reason for home health care (List medical condition): left great toe amputation   The encounter with the patient was in whole, or in part, for the following medical condition, which is the primary reason for home health care: left great toe amputation   I certify that, based on my findings, the following services are medically necessary home health services: Physical therapy   Reason for Medically Necessary Home Health Services: Therapy- Therapeutic Exercises to Increase Strength and Endurance   My clinical findings support the need for the above services: Pain interferes with ambulation/mobility   Further, I certify that my clinical findings support that  this patient is homebound due to: Pain interferes with ambulation/mobility   Home Health    Complete by: As directed    To provide the following care/treatments:  PT OT       Allergies as of 09/15/2020      Reactions   Vancomycin Rash      Medication List    STOP taking these medications   linezolid 600 MG tablet Commonly known as: ZYVOX   losartan 100 MG tablet Commonly known as: COZAAR     TAKE these medications   allopurinol 100 MG tablet Commonly known as: ZYLOPRIM Take 100 mg by mouth daily.   amoxicillin-clavulanate 875-125 MG tablet Commonly known as: AUGMENTIN Take 1 tablet by mouth every 12 (twelve) hours. What changed:   how much to take  how to take this  when to take this  Another medication with the same name was removed. Continue taking this medication, and follow the directions you see here.   aspirin EC 81 MG tablet Take 81 mg by mouth daily. Swallow whole.   Biotin 5000 MCG Caps Take 5,000 mcg by mouth daily.   Castellani Paint Modified 1.5 % Liqd Apply between toes as needed for maceration.   clopidogrel 75 MG tablet Commonly known as: PLAVIX Take 75 mg by mouth daily. What changed: Another medication with the same name was removed. Continue taking this medication, and follow the directions you see here.   colchicine 0.6 MG tablet Take 0.6 mg by mouth daily as needed (gout flare up).   diltiazem 300 MG 24 hr capsule Commonly known as: CARDIZEM CD Take 300 mg by mouth daily.   dorzolamide 2 % ophthalmic solution Commonly known as: TRUSOPT Place 1 drop into both eyes at bedtime.   doxycycline 100 MG tablet Commonly known as: VIBRA-TABS Take 1 tablet (100 mg total) by mouth every 12 (twelve) hours. What changed:   how much to take  how to take this  when to take this  Another medication with the same name was removed. Continue taking this medication, and follow the directions you see here.   ferrous sulfate 325 (65 FE) MG tablet Take 325 mg by mouth every Monday, Wednesday, and Friday.   fish oil-omega-3 fatty  acids 1000 MG capsule Take 2 g by mouth daily.   FreeStyle Libre 14 Day Sensor Misc Use as directed to check blood sugars   FreeStyle Precision Neo Test test strip Generic drug: glucose blood Use as instructed   furosemide 40 MG tablet Commonly known as: LASIX Take 2 tabs in morning and 1 tab evening What changed:   how much to take  how to take this  when to take this  additional instructions   gabapentin 100 MG capsule Commonly known as: NEURONTIN Take 2 capsules in am 1 capsule mid day and 1 capsule in evening   HYDROcodone-acetaminophen 5-325 MG tablet Commonly known as: NORCO/VICODIN Take 1 tablet by mouth every 6 (six) hours as needed for moderate pain.   latanoprost 0.005 % ophthalmic solution Commonly known as: XALATAN Place 1 drop into both eyes at bedtime.   Magnesium 250 MG Tabs Take 1 tablet by mouth daily.   multivitamin with minerals Tabs tablet Take 1 tablet by mouth daily.   Ozempic ([name]) 2 MG/1.5ML Sopn Generic drug: Semaglutide ([name]) Inject 1 mg into the skin once a week. What changed: when to take this   pantoprazole 40 MG tablet Commonly known as: PROTONIX Take 1 tablet  by  mouth daily. What changed: when to take this   PRESERVISION AREDS PO Take 1 tablet by mouth daily.   REFRESH OP Place 1 drop into both eyes daily as needed (dry eyes).   rosuvastatin 10 MG tablet Commonly known as: CRESTOR Take 1 tablet (10 mg total) by mouth daily. What changed:   when to take this  additional instructions   tamsulosin 0.4 MG Caps capsule Commonly known as: FLOMAX Take 0.4 mg by mouth daily.       Allergies  Allergen Reactions  . Vancomycin Rash    Consultations:  Vascular surgery  podiatry   Procedures/Studies: DG Foot Complete Left  Result Date: 09/14/2020 CLINICAL DATA:  Post left foot surgery. EXAM: LEFT FOOT - COMPLETE 3+ VIEW COMPARISON:  08/10/2020 FINDINGS: Interval postoperative changes with amputation  of the first toe at the metatarsal-phalangeal level. Overlying soft tissues appear intact. Diffuse bone demineralization. Degenerative changes in the intertarsal joints. Small plantar calcaneal spur. No acute fracture or focal bone erosion is suggested. Prominent vascular calcifications in the soft tissues. IMPRESSION: Interval amputation of the first toe at the metatarsal-phalangeal level. Overlying soft tissues appear intact. Electronically Signed   By: Lucienne Capers M.D.   On: 09/14/2020 20:36   VAS [place] ABI WITH/WO TBI  Result Date: 09/04/2020 LOWER EXTREMITY DOPPLER STUDY Indications: Ulceration left great toe, and peripheral artery disease. High Risk Factors: Hypertension, Diabetes, prior CVA.  Vascular Interventions: Right BKA. Angioplasty left anterior tibial artery                         08/11/2020, CKD. Performing Technologist: Alvia Grove RVT  Examination Guidelines: A complete evaluation includes at minimum, Doppler waveform signals and systolic blood pressure reading at the level of bilateral brachial, anterior tibial, and posterior tibial arteries, when vessel segments are accessible. Bilateral testing is considered an integral part of a complete examination. Photoelectric Plethysmograph (PPG) waveforms and toe systolic pressure readings are included as required and additional duplex testing as needed. Limited examinations for reoccurring indications may be performed as noted.  ABI Findings: +--------+------------------+-----+--------+--------+ Right   Rt Pressure (mmHg)IndexWaveformComment  +--------+------------------+-----+--------+--------+ [id number]                                     +--------+------------------+-----+--------+--------+ +---------+------------------+-----+--------+-------+ Left     Lt Pressure (mmHg)IndexWaveformComment +---------+------------------+-----+--------+-------+ Brachial 160                                     +---------+------------------+-----+--------+-------+ PTA      254               1.55                 +---------+------------------+-----+--------+-------+ DP       279               1.70                 +---------+------------------+-----+--------+-------+ Great Toe                               Bandate +---------+------------------+-----+--------+-------+ +-------+-----------+-----------+------------+------------+ ABI/TBIToday's ABIToday's TBIPrevious ABIPrevious TBI +-------+-----------+-----------+------------+------------+ Right  BKA                                            +-------+-----------+-----------+------------+------------+  Left   [place]         Bandage    [place]          -            +-------+-----------+-----------+------------+------------+  Summary: Left: Resting left ankle-brachial index indicates noncompressible left lower extremity arteries. Dampened monophasic waveforms noted.  *See table(s) above for measurements and observations.  Electronically signed by Servando Snare MD on 09/04/2020 at 2:48:08 PM.   Final    OCT, Retina - OU - Both Eyes  Result Date: 09/07/2020 Right Eye Quality was borderline. Scan locations included subfoveal. Progression has been stable. Findings include abnormal foveal contour, central retinal atrophy, outer retinal atrophy. Left Eye Quality was good. Scan locations included subfoveal. Central Foveal Thickness: 206. Progression has been stable. Findings include abnormal foveal contour, central retinal atrophy, outer retinal atrophy. Notes No active CSME OU  Color Fundus Photography Optos - OU - Both Eyes  Result Date: 09/07/2020 Right Eye Progression has been stable. Disc findings include normal observations. Macula : microaneurysms. Left Eye Progression has been stable. Disc findings include normal observations. Macula : microaneurysms. Notes Proliferative diabetic retinopathy OU quiet, clear media, no active NVE growth  VAS [place]  LOWER EXTREMITY ARTERIAL DUPLEX  Result Date: 09/04/2020 LOWER EXTREMITY ARTERIAL DUPLEX STUDY Indications: Ulceration left great toe, and peripheral artery disease. High Risk Factors: Hypertension, hyperlipidemia, Diabetes, prior CVA.  Vascular Interventions: Right BKA. Angioplasty left ATA 08/11/2020. Current ABI:            Right BKA Left House Performing Technologist: Alvia Grove RVT  Examination Guidelines: A complete evaluation includes B-mode imaging, spectral Doppler, color Doppler, and power Doppler as needed of all accessible portions of each vessel. Bilateral testing is considered an integral part of a complete examination. Limited examinations for reoccurring indications may be performed as noted.   +-----------+--------+-----+--------+----------+--------+ LEFT       PSV cm/sRatioStenosisWaveform  Comments +-----------+--------+-----+--------+----------+--------+ CFA Prox   132                  triphasic plaque   +-----------+--------+-----+--------+----------+--------+ CFA Distal 175                  triphasic          +-----------+--------+-----+--------+----------+--------+ DFA        102                  biphasic           +-----------+--------+-----+--------+----------+--------+ SFA Prox   149                  triphasic broad    +-----------+--------+-----+--------+----------+--------+ SFA Mid    85                   triphasic dampened +-----------+--------+-----+--------+----------+--------+ SFA Distal 112                  biphasic  broad    +-----------+--------+-----+--------+----------+--------+ POP Prox   77                   biphasic  dampened +-----------+--------+-----+--------+----------+--------+ POP Mid    114                  monophasicbeisk    +-----------+--------+-----+--------+----------+--------+ POP Distal 109                  monophasicbrisk    +-----------+--------+-----+--------+----------+--------+ ATA Distal  25  monophasicdampened +-----------+--------+-----+--------+----------+--------+ PTA Distal 49                   monophasic         +-----------+--------+-----+--------+----------+--------+ PERO Distal                               NV       +-----------+--------+-----+--------+----------+--------+  Summary: Left: 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  See table(s) above for measurements and observations. Electronically signed by Servando Snare MD on 09/04/2020 at 2:48:37 PM.    Final        Subjective: Having some pain in his foot. No shortness of breath  Discharge Exam: Vitals:   09/14/20 2355 09/14/20 2358 09/15/20 0424 09/15/20 1128  BP:  121/74 139/66 (!) 135/56  Pulse: 82 77 72 78  Resp: $Remo'18 16 18 18  'OEbps$ Temp:  98.1 F (36.7 C) (!) 97.5 F (36.4 C) 98.4 F (36.9 C)  TempSrc:  Oral Oral   SpO2: 96% 97% 99% 98%  Weight:      Height:        General: Pt is alert, awake, not in acute distress Cardiovascular: RRR, S1/S2 +, no rubs, no gallops Respiratory: CTA bilaterally, no wheezing, no rhonchi Abdominal: Soft, NT, ND, bowel sounds + Extremities: R BKA, LLE with dressing    The results of significant diagnostics from this hospitalization (including imaging, microbiology, ancillary and laboratory) are listed below for reference.     Microbiology: Recent Results (from the past 240 hour(s))  Resp Panel by RT-PCR (Flu A&B, Covid) Nasopharyngeal Swab     Status: None   Collection Time: 09/11/20  5:13 PM   Specimen: Nasopharyngeal Swab; Nasopharyngeal(NP) swabs in vial transport medium  Result Value Ref Range Status   SARS Coronavirus 2 by RT PCR NEGATIVE NEGATIVE Final    Comment: (NOTE) SARS-CoV-2 target nucleic acids are NOT DETECTED.  The SARS-CoV-2 RNA is generally detectable in upper respiratory specimens during the acute phase of infection. The lowest concentration of SARS-CoV-2 viral copies this assay can detect is 138 copies/mL. A  negative result does not preclude SARS-Cov-2 infection and should not be used as the sole basis for treatment or other patient management decisions. A negative result may occur with  improper specimen collection/handling, submission of specimen other than nasopharyngeal swab, presence of viral mutation(s) within the areas targeted by this assay, and inadequate number of viral copies(<138 copies/mL). A negative result must be combined with clinical observations, patient history, and epidemiological information. The expected result is Negative.  Fact Sheet for Patients:  EntrepreneurPulse.com.au  Fact Sheet for Healthcare Providers:  IncredibleEmployment.be  This test is no t yet approved or cleared by the [place] FDA and  has been authorized for detection and/or diagnosis of SARS-CoV-2 by FDA under an Emergency Use Authorization (EUA). This EUA will remain  in effect (meaning this test can be used) for the duration of the COVID-19 declaration under Section 564(b)(1) of the Act, 21 U.S.C.section 360bbb-3(b)(1), unless the authorization is terminated  or revoked sooner.       Influenza A by PCR NEGATIVE NEGATIVE Final   Influenza B by PCR NEGATIVE NEGATIVE Final    Comment: (NOTE) The Xpert Xpress SARS-CoV-2/FLU/RSV plus assay is intended as an aid in the diagnosis of influenza from Nasopharyngeal swab specimens and should not be used as a sole basis for treatment. Nasal washings and aspirates are unacceptable for Xpert Xpress SARS-CoV-2/FLU/RSV testing.  Fact Sheet for Patients: EntrepreneurPulse.com.au  Fact Sheet for Healthcare Providers: IncredibleEmployment.be  This test is not yet approved or cleared by the [place] FDA and has been authorized for detection and/or diagnosis of SARS-CoV-2 by FDA under an Emergency Use Authorization (EUA). This EUA will remain in effect (meaning this test can  be used) for the duration of the COVID-19 declaration under Section 564(b)(1) of the Act, 21 U.S.C. section 360bbb-3(b)(1), unless the authorization is terminated or revoked.  Performed at [facility], [place] [address] [place], [place] [address]   Blood culture (routine x 2)     Status: None (Preliminary result)   Collection Time: 09/11/20  5:25 PM   Specimen: BLOOD  Result Value Ref Range Status   Specimen Description BLOOD LEFT ANTECUBITAL  Final   Special Requests   Final    BOTTLES DRAWN AEROBIC AND ANAEROBIC Blood Culture adequate volume   Culture   Final    NO GROWTH 4 DAYS Performed at [facility], [place] [address], [place], [place] [address]    Report Status PENDING  Incomplete  Blood culture (routine x 2)     Status: None (Preliminary result)   Collection Time: 09/11/20  6:00 PM   Specimen: BLOOD LEFT HAND  Result Value Ref Range Status   Specimen Description BLOOD LEFT HAND  Final   Special Requests   Final    BOTTLES DRAWN AEROBIC AND ANAEROBIC Blood Culture adequate volume   Culture   Final    NO GROWTH 4 DAYS Performed at [facility], [place] [address] [place], [place] [address]    Report Status PENDING  Incomplete  Surgical PCR screen     Status: None   Collection Time: 09/14/20  3:58 PM   Specimen: Nasal Mucosa; Nasal Swab  Result Value Ref Range Status   MRSA, PCR NEGATIVE NEGATIVE Final   Staphylococcus aureus NEGATIVE NEGATIVE Final    Comment: (NOTE) The Xpert SA Assay (FDA approved for NASAL specimens in patients 63 years of age and older), is one component of a comprehensive surveillance program. It is not intended to diagnose infection nor to guide or monitor treatment. Performed at [facility], [place] [address] [place], [place] [address]   Aerobic/Anaerobic Culture w Gram Stain (surgical/deep wound)     Status: None (Preliminary result)   Collection Time: 09/14/20  5:57 PM   Specimen: Toe, Left; Amputation  Result Value Ref  Range Status   Specimen Description WOUND LEFT TOE ULCER  Final   Special Requests PT ON ROCEPHIN  Final   Gram Stain   Final    RARE WBC PRESENT,BOTH PMN AND MONONUCLEAR NO ORGANISMS SEEN    Culture   Final    CULTURE REINCUBATED FOR BETTER GROWTH Performed at [facility], [place] [address], [place], [place] [address]    Report Status PENDING  Incomplete     Labs: BNP (last 3 results) No results for input(s): BNP in the last 8760 hours. Basic Metabolic Panel: Recent Labs  Lab 09/11/20 1800 09/12/20 0513 09/13/20 0129 09/14/20 0112 09/15/20 0517  NA 135 136 137 137 138  K 4.5 4.0 4.5 5.1 4.7  CL 103 105 107 108 108  CO2 $Re'24 24 25 25 25  'XIZ$ GLUCOSE 116* 101* 91 115* 101*  BUN 76* 63* 51* 44* 35*  CREATININE 2.84* 2.26* 1.84* 1.69* 1.64*  CALCIUM 9.6 9.5 9.5 9.6 9.9   Liver Function Tests: No results for input(s):  AST, ALT, ALKPHOS, BILITOT, PROT, ALBUMIN in the last 168 hours. No results for input(s): LIPASE, AMYLASE in the last 168 hours. No results for input(s): AMMONIA in the last 168 hours. CBC: Recent Labs  Lab 09/11/20 1800 09/12/20 0513 09/14/20 0112 09/15/20 0517  WBC 9.2 7.2 7.8 6.0  NEUTROABS 6.8  --   --   --   HGB 9.1* 9.4* 8.6* 9.0*  HCT 29.0* 29.2* 26.5* 28.1*  MCV 94.5 93.3 95.3 95.6  PLT 249 252 247 268   Cardiac Enzymes: No results for input(s): CKTOTAL, CKMB, CKMBINDEX, TROPONINI in the last 168 hours. BNP: Invalid input(s): POCBNP CBG: Recent Labs  Lab 09/14/20 1552 09/14/20 1830 09/14/20 2139 09/15/20 0801 09/15/20 1129  GLUCAP 103* 103* 110* 100* 123*   D-Dimer No results for input(s): DDIMER in the last 72 hours. Hgb A1c No results for input(s): HGBA1C in the last 72 hours. Lipid Profile No results for input(s): CHOL, HDL, LDLCALC, TRIG, CHOLHDL, LDLDIRECT in the last 72 hours. Thyroid function studies No results for input(s): TSH, T4TOTAL, T3FREE, THYROIDAB in the last 72 hours.  Invalid input(s): FREET3 Anemia work  up No results for input(s): VITAMINB12, FOLATE, FERRITIN, TIBC, IRON, RETICCTPCT in the last 72 hours. Urinalysis    Component Value Date/Time   COLORURINE YELLOW 08/09/2020 1159   APPEARANCEUR HAZY (A) 08/09/2020 1159   LABSPEC 1.012 08/09/2020 1159   PHURINE 5.0 08/09/2020 1159   GLUCOSEU NEGATIVE 08/09/2020 1159   HGBUR NEGATIVE 08/09/2020 1159   BILIRUBINUR NEGATIVE 08/09/2020 1159   BILIRUBINUR negative 05/15/2019 1029   KETONESUR NEGATIVE 08/09/2020 1159   PROTEINUR 30 (A) 08/09/2020 1159   UROBILINOGEN 0.2 05/15/2019 1029   UROBILINOGEN 0.2 01/08/2011 1558   NITRITE NEGATIVE 08/09/2020 1159   LEUKOCYTESUR NEGATIVE 08/09/2020 1159   Sepsis Labs Invalid input(s): PROCALCITONIN,  WBC,  LACTICIDVEN Microbiology Recent Results (from the past 240 hour(s))  Resp Panel by RT-PCR (Flu A&B, Covid) Nasopharyngeal Swab     Status: None   Collection Time: 09/11/20  5:13 PM   Specimen: Nasopharyngeal Swab; Nasopharyngeal(NP) swabs in vial transport medium  Result Value Ref Range Status   SARS Coronavirus 2 by RT PCR NEGATIVE NEGATIVE Final    Comment: (NOTE) SARS-CoV-2 target nucleic acids are NOT DETECTED.  The SARS-CoV-2 RNA is generally detectable in upper respiratory specimens during the acute phase of infection. The lowest concentration of SARS-CoV-2 viral copies this assay can detect is 138 copies/mL. A negative result does not preclude SARS-Cov-2 infection and should not be used as the sole basis for treatment or other patient management decisions. A negative result may occur with  improper specimen collection/handling, submission of specimen other than nasopharyngeal swab, presence of viral mutation(s) within the areas targeted by this assay, and inadequate number of viral copies(<138 copies/mL). A negative result must be combined with clinical observations, patient history, and epidemiological information. The expected result is Negative.  Fact Sheet for Patients:   EntrepreneurPulse.com.au  Fact Sheet for Healthcare Providers:  IncredibleEmployment.be  This test is no t yet approved or cleared by the [place] FDA and  has been authorized for detection and/or diagnosis of SARS-CoV-2 by FDA under an Emergency Use Authorization (EUA). This EUA will remain  in effect (meaning this test can be used) for the duration of the COVID-19 declaration under Section 564(b)(1) of the Act, 21 U.S.C.section 360bbb-3(b)(1), unless the authorization is terminated  or revoked sooner.       Influenza A by PCR NEGATIVE NEGATIVE Final   Influenza  B by PCR NEGATIVE NEGATIVE Final    Comment: (NOTE) The Xpert Xpress SARS-CoV-2/FLU/RSV plus assay is intended as an aid in the diagnosis of influenza from Nasopharyngeal swab specimens and should not be used as a sole basis for treatment. Nasal washings and aspirates are unacceptable for Xpert Xpress SARS-CoV-2/FLU/RSV testing.  Fact Sheet for Patients: EntrepreneurPulse.com.au  Fact Sheet for Healthcare Providers: IncredibleEmployment.be  This test is not yet approved or cleared by the [place] FDA and has been authorized for detection and/or diagnosis of SARS-CoV-2 by FDA under an Emergency Use Authorization (EUA). This EUA will remain in effect (meaning this test can be used) for the duration of the COVID-19 declaration under Section 564(b)(1) of the Act, 21 U.S.C. section 360bbb-3(b)(1), unless the authorization is terminated or revoked.  Performed at [facility], [place] [address] [place], [place] [address]   Blood culture (routine x 2)     Status: None (Preliminary result)   Collection Time: 09/11/20  5:25 PM   Specimen: BLOOD  Result Value Ref Range Status   Specimen Description BLOOD LEFT ANTECUBITAL  Final   Special Requests   Final    BOTTLES DRAWN AEROBIC AND ANAEROBIC Blood Culture adequate volume   Culture    Final    NO GROWTH 4 DAYS Performed at [facility], [place] [address], [place], [place] [address]    Report Status PENDING  Incomplete  Blood culture (routine x 2)     Status: None (Preliminary result)   Collection Time: 09/11/20  6:00 PM   Specimen: BLOOD LEFT HAND  Result Value Ref Range Status   Specimen Description BLOOD LEFT HAND  Final   Special Requests   Final    BOTTLES DRAWN AEROBIC AND ANAEROBIC Blood Culture adequate volume   Culture   Final    NO GROWTH 4 DAYS Performed at [facility], [place] [address], [place], [place] [address]    Report Status PENDING  Incomplete  Surgical PCR screen     Status: None   Collection Time: 09/14/20  3:58 PM   Specimen: Nasal Mucosa; Nasal Swab  Result Value Ref Range Status   MRSA, PCR NEGATIVE NEGATIVE Final   Staphylococcus aureus NEGATIVE NEGATIVE Final    Comment: (NOTE) The Xpert SA Assay (FDA approved for NASAL specimens in patients 70 years of age and older), is one component of a comprehensive surveillance program. It is not intended to diagnose infection nor to guide or monitor treatment. Performed at [facility], [place] [address] [place], [place] [address]   Aerobic/Anaerobic Culture w Gram Stain (surgical/deep wound)     Status: None (Preliminary result)   Collection Time: 09/14/20  5:57 PM   Specimen: Toe, Left; Amputation  Result Value Ref Range Status   Specimen Description WOUND LEFT TOE ULCER  Final   Special Requests PT ON ROCEPHIN  Final   Gram Stain   Final    RARE WBC PRESENT,BOTH PMN AND MONONUCLEAR NO ORGANISMS SEEN    Culture   Final    CULTURE REINCUBATED FOR BETTER GROWTH Performed at [facility], [place] [address] [place],  [address]    Report Status PENDING  Incomplete     Time coordinating discharge: 16mins  SIGNED:   Kathie Dike, MD  [facility] 09/15/2020, 4:20 PM   If 7PM-7AM, please contact night-coverage [email or web address]

## 2020-09-15 NOTE — TOC Initial Note (Signed)
Transition of Care [facility]) - Initial/Assessment Note    Patient Details  Name: Micheal Walls MRN: [id number] Date of Birth: 1935/10/02  Transition of Care [facility]) CM/SW Contact:    Marilu Favre, RN Phone Number: 09/15/2020, 4:29 PM  Clinical Narrative:                 Patient from home with wife, and grandson.  Patient has motorized wheel chair and a manuel wheel chair, walker.   PT recommending HHPT and patient stay one more night.   Patient states he was active with [facility] prior to admission with [facility].   Patient asked NCM to call his daughter [name] [phone]. No answer left a message. Patient called from, his phone, daughter answered. Above discussed. Daughter asking regarding wound. NCM does not see wound care orders entered yet or note from surgeon. Wound care will need to be clarified and order for [facility].   NCM called Levada Dy with [name]. Awaiting call back.   Expected Discharge Plan: [place]     Patient Goals and CMS Choice Patient states their goals for this hospitalization and ongoing recovery are:: to return to home CMS Medicare.gov Compare Post Acute Care list provided to:: Patient Choice offered to / list presented to : Patient  Expected Discharge Plan and Services Expected Discharge Plan: [place]   Discharge Planning Services: CM Consult Post Acute Care Choice: [place] arrangements for the past 2 months: Single Family Home                 DME Arranged: N/A DME Agency: NA       HH Arranged: PT,OT,RN          Prior Living Arrangements/Services Living arrangements for the past 2 months: Single Family Home Lives with:: Spouse Patient language and need for interpreter reviewed:: Yes        Need for Family Participation in Patient Care: Yes (Comment) Care giver support system in place?: Yes (comment) Current home services: DME Criminal Activity/Legal Involvement  Pertinent to Current Situation/Hospitalization: No - Comment as needed  Activities of Daily Living      Permission Sought/Granted   Permission granted to share information with : Yes, Verbal Permission Granted  Share Information with NAME: [name] [phone]  Permission granted to share info w AGENCY: [name]        Emotional Assessment Appearance:: Appears stated age Attitude/Demeanor/Rapport: Engaged Affect (typically observed): Accepting Orientation: : Oriented to Self,Oriented to Place,Oriented to  Time,Oriented to Situation Alcohol / Substance Use: Not Applicable Psych Involvement: No (comment)  Admission diagnosis:  Cellulitis of great toe of left foot [L03.032] AKI (acute kidney injury) ([place]) [N17.9] Patient Active Problem List   Diagnosis Date Noted  . 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[place])   . Left ankle swelling   . Diabetic foot infection ([place])   . Cerebral thrombosis with cerebral infarction 07/20/2020  . Thrombocytopenia ([place]) 07/19/2020  . Cerebral ischemia 07/18/2020  . Bilateral hearing loss 05/20/2020  . Does use hearing aid 05/20/2020  . Diabetic ulcer of left great toe ([place]) 01/16/2020  . Controlled type 2 diabetes mellitus with stable proliferative retinopathy of both eyes, with long-term current use of insulin ([place]) 01/06/2020  . Intermediate stage nonexudative age-related macular degeneration of both eyes 01/06/2020  . Left epiretinal membrane 01/06/2020  . Drusen of right macula 01/06/2020  . AKI (acute kidney injury) ([place]) 11/02/2019  .  Cellulitis 11/02/2019  . Elevated TSH 07/10/2018  . Nephropathy 02/28/2018  . Disturbance of skin sensation 02/28/2018  . PAD (peripheral artery disease) ([place]) 07/17/2017  . 110.1 10/21/2013  . Epiphora 04/24/2013  . Laxity of eyelid 04/24/2013  . Anemia 04/09/2013  . History of peripheral vascular disease 04/09/2013  . History of stroke 04/09/2013  . S/P unilateral BKA (below knee amputation) ([place])  03/17/2013  . Diabetes mellitus type 2 in obese ([place]) 03/17/2013  . Mixed hyperlipidemia 03/17/2013  . Essential hypertension 03/17/2013  . Gout 03/17/2013  . CKD (chronic kidney disease), stage III ([place]) 03/17/2013  . OSA on CPAP 03/17/2013  . Anemia, iron deficiency 03/17/2013  . Diverticulosis of colon 03/17/2013  . RBBB 03/17/2013  . Punctal stenosis, acquired 10/02/2012   PCP:  [name], [name] Pharmacy:   [id number] - , [place] [address] Phone: [phone] Fax: [phone]  [facility] [place], [place] - [address] [place] [address] Phone: [phone] Fax: [phone]  [facility] [place], [place] - [address] [place] [address] Phone: [phone] Fax: [phone]     Social Determinants of Health (SDOH) Interventions    Readmission Risk Interventions Readmission Risk Prevention Plan 08/13/2020  Transportation Screening Complete  PCP or Specialist Appt within 3-5 Days Complete  HRI or Home Care Consult Complete  Social Work Consult for [place] Planning/Counseling Complete  Palliative Care Screening Not Applicable  Medication Review Press photographer) Complete  Some recent data might be hidden

## 2020-09-16 ENCOUNTER — Ambulatory Visit (INDEPENDENT_AMBULATORY_CARE_PROVIDER_SITE_OTHER): Payer: Medicare Other

## 2020-09-16 DIAGNOSIS — I739 Peripheral vascular disease, unspecified: Secondary | ICD-10-CM

## 2020-09-16 DIAGNOSIS — E1159 Type 2 diabetes mellitus with other circulatory complications: Secondary | ICD-10-CM

## 2020-09-16 DIAGNOSIS — Z794 Long term (current) use of insulin: Secondary | ICD-10-CM

## 2020-09-16 LAB — SURGICAL PATHOLOGY

## 2020-09-16 LAB — CULTURE, BLOOD (ROUTINE X 2)
Culture: NO GROWTH
Culture: NO GROWTH
Special Requests: ADEQUATE
Special Requests: ADEQUATE

## 2020-09-16 LAB — GLUCOSE, CAPILLARY
Glucose-Capillary: 101 mg/dL — ABNORMAL HIGH (ref 70–99)
Glucose-Capillary: 139 mg/dL — ABNORMAL HIGH (ref 70–99)

## 2020-09-16 NOTE — Chronic Care Management (AMB) (Signed)
Chronic Care Management    Social Work Note  09/16/2020 Name: Micheal Walls MRN: [id number] DOB: Oct 10, 1935  [name] is a 85 y.o. year old male who is a primary care patient of [name], [place]. The CCM team was consulted to assist the patient with chronic disease management and/or care coordination needs related to: [employer] .   Engaged with patient daughter by phone for follow up visit in response to provider referral for social work chronic care management and care coordination services.   Consent to Services:  The patient was given information about Chronic Care Management services, agreed to services, and gave verbal consent prior to initiation of services.  Please see initial visit note for detailed documentation.   Patient agreed to services and consent obtained.   Assessment: Review of patient past medical history, allergies, medications, and health status, including review of relevant consultants reports was performed today as part of a comprehensive evaluation and provision of chronic care management and care coordination services.     SDOH (Social Determinants of Health) assessments and interventions performed:    Advanced Directives Status: Not addressed in this encounter.  CCM Care Plan  Allergies  Allergen Reactions  . Vancomycin Rash    Facility-Administered Encounter Medications as of 09/16/2020  Medication  . acetaminophen (TYLENOL) tablet 650 mg   Or  . acetaminophen (TYLENOL) suppository 650 mg  . allopurinol (ZYLOPRIM) tablet 100 mg  . amoxicillin-clavulanate (AUGMENTIN) 875-125 MG per tablet 1 tablet  . aspirin EC tablet 81 mg  . clopidogrel (PLAVIX) tablet 75 mg  . diltiazem (CARDIZEM CD) 24 hr capsule 300 mg  . dorzolamide (TRUSOPT) 2 % ophthalmic solution 1 drop  . doxycycline (VIBRA-TABS) tablet 100 mg  . enoxaparin (LOVENOX) injection 40 mg  . ferrous sulfate tablet 325 mg  . gabapentin (NEURONTIN) capsule 100 mg  .  gabapentin (NEURONTIN) capsule 200 mg  . insulin aspart (novoLOG) injection 0-9 Units  . lactated ringers infusion  . latanoprost (XALATAN) 0.005 % ophthalmic solution 1 drop  . multivitamin with minerals tablet 1 tablet  . mupirocin ointment (BACTROBAN) 2 % 1 application  . nutrition supplement (JUVEN) (JUVEN) powder packet 1 packet  . omega-3 acid ethyl esters (LOVAZA) capsule 2 g  . ondansetron (ZOFRAN) tablet 4 mg   Or  . ondansetron (ZOFRAN) injection 4 mg  . pantoprazole (PROTONIX) EC tablet 40 mg  . polyvinyl alcohol (LIQUIFILM TEARS) 1.4 % ophthalmic solution  . rosuvastatin (CRESTOR) tablet 10 mg  . tamsulosin (FLOMAX) capsule 0.4 mg   Outpatient Encounter Medications as of 09/16/2020  Medication Sig Note  . allopurinol (ZYLOPRIM) 100 MG tablet Take 100 mg by mouth daily.   Marland Kitchen amoxicillin-clavulanate (AUGMENTIN) 875-125 MG tablet TAKE 1 TABLET BY MOUTH EVERY TWELVE HOURS FOR 16 DAYS.   Marland Kitchen amoxicillin-clavulanate (AUGMENTIN) 875-125 MG tablet TAKE 1 TABLET BY MOUTH EVERY TWELVE HOURS FOR 6 DAYS.   Marland Kitchen amoxicillin-clavulanate (AUGMENTIN) 875-125 MG tablet Take 1 tablet by mouth every 12 (twelve) hours.   Marland Kitchen aspirin EC 81 MG tablet Take 81 mg by mouth daily. Swallow whole.   . Biotin 5000 MCG CAPS Take 5,000 mcg by mouth daily.   [name] Apply between toes as needed for maceration.   . clopidogrel (PLAVIX) 75 MG tablet Take 75 mg by mouth daily.   . clopidogrel (PLAVIX) 75 MG tablet TAKE 1 TABLET (75 MG TOTAL) BY MOUTH DAILY.   Marland Kitchen colchicine 0.6 MG tablet Take 0.6  mg by mouth daily as needed (gout flare up).   . Continuous Blood Gluc Sensor (FREESTYLE LIBRE 14 DAY SENSOR) MISC Use as directed to check blood sugars   . diltiazem (CARDIZEM CD) 300 MG 24 hr capsule Take 300 mg by mouth daily.   . dorzolamide (TRUSOPT) 2 % ophthalmic solution Place 1 drop into both eyes at bedtime.   Marland Kitchen doxycycline (VIBRA-TABS) 100 MG tablet TAKE 1 TABLET (100 MG TOTAL) BY MOUTH  EVERY TWELVE HOURS FOR 9 DAYS.   Marland Kitchen doxycycline (VIBRA-TABS) 100 MG tablet TAKE 1 TABLET (100 MG TOTAL) BY MOUTH EVERY TWELVE HOURS FOR 6 DAYS.   Marland Kitchen doxycycline (VIBRA-TABS) 100 MG tablet Take 1 tablet (100 mg total) by mouth every 12 (twelve) hours.   . ferrous sulfate 325 (65 FE) MG tablet Take 325 mg by mouth every Monday, Wednesday, and Friday.   . fish oil-omega-3 fatty acids 1000 MG capsule Take 2 g by mouth daily.   . furosemide (LASIX) 40 MG tablet Take 2 tabs in morning and 1 tab evening (Patient taking differently: Take 40-80 mg by mouth 2 (two) times daily. 80 mg in the morning and 40 mg at night) 08/09/2020: 80 mg in the morning & 40 at bedtime   . gabapentin (NEURONTIN) 100 MG capsule Take 2 capsules in am 1 capsule mid day and 1 capsule in evening   . glucose blood (FREESTYLE PRECISION NEO TEST) test strip Use as instructed   . HYDROcodone-acetaminophen (NORCO/VICODIN) 5-325 MG tablet Take 1 tablet by mouth every 6 (six) hours as needed for moderate pain.   Marland Kitchen latanoprost (XALATAN) 0.005 % ophthalmic solution Place 1 drop into both eyes at bedtime.    Marland Kitchen linezolid (ZYVOX) 600 MG tablet TAKE 1 TABLET (600 MG TOTAL) BY MOUTH EVERY TWELVE HOURS FOR 7 DAYS.   Marland Kitchen losartan (COZAAR) 100 MG tablet Take 100 mg by mouth daily.   . Magnesium 250 MG TABS Take 1 tablet by mouth daily.   . Multiple Vitamin (MULTIVITAMIN WITH MINERALS) TABS tablet Take 1 tablet by mouth daily.   . Multiple Vitamins-Minerals (PRESERVISION AREDS PO) Take 1 tablet by mouth daily.   . pantoprazole (PROTONIX) 40 MG tablet Take 1 tablet  by mouth daily. (Patient taking differently: Take 40 mg by mouth at bedtime.)   . Polyvinyl Alcohol-Povidone (REFRESH OP) Place 1 drop into both eyes daily as needed (dry eyes).   . rosuvastatin (CRESTOR) 10 MG tablet Take 1 tablet (10 mg total) by mouth daily. (Patient taking differently: Take 10 mg by mouth every 7 (seven) days. Wednesday of each week)   . Semaglutide, [name], (OZEMPIC, 1  MG/DOSE,) 2 MG/1.5ML SOPN Inject 1 mg into the skin once a week. (Patient taking differently: Inject 1 mg into the skin every Wednesday.)   . tamsulosin (FLOMAX) 0.4 MG CAPS capsule Take 0.4 mg by mouth daily.     Patient Active Problem List   Diagnosis Date Noted  . 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[place])   . Left ankle swelling   . Diabetic foot infection ([place])   . Cerebral thrombosis with cerebral infarction 07/20/2020  . Thrombocytopenia ([place]) 07/19/2020  . Cerebral ischemia 07/18/2020  . Bilateral hearing loss 05/20/2020  . Does use hearing aid 05/20/2020  . Diabetic ulcer of left great toe ([place]) 01/16/2020  . Controlled type 2 diabetes mellitus with stable proliferative retinopathy of both eyes, with long-term current use of insulin ([place]) 01/06/2020  . Intermediate stage nonexudative age-related macular degeneration of both eyes 01/06/2020  .  Left epiretinal membrane 01/06/2020  . Drusen of right macula 01/06/2020  . AKI (acute kidney injury) ([place]) 11/02/2019  . Cellulitis 11/02/2019  . Elevated TSH 07/10/2018  . Nephropathy 02/28/2018  . Disturbance of skin sensation 02/28/2018  . PAD (peripheral artery disease) ([place]) 07/17/2017  . 110.1 10/21/2013  . Epiphora 04/24/2013  . Laxity of eyelid 04/24/2013  . Anemia 04/09/2013  . History of peripheral vascular disease 04/09/2013  . History of stroke 04/09/2013  . S/P unilateral BKA (below knee amputation) ([place]) 03/17/2013  . Diabetes mellitus type 2 in obese ([place]) 03/17/2013  . Mixed hyperlipidemia 03/17/2013  . Essential hypertension 03/17/2013  . Gout 03/17/2013  . CKD (chronic kidney disease), stage III ([place]) 03/17/2013  . OSA on CPAP 03/17/2013  . Anemia, iron deficiency 03/17/2013  . Diverticulosis of colon 03/17/2013  . RBBB 03/17/2013  . Punctal stenosis, acquired 10/02/2012    Conditions to be addressed/monitored: DMII and PAD; Level of care concerns  Care Plan : Social Work [facility] Plan of Care  Updates made by [name] since 09/16/2020 12:00 AM    Problem: Mobility and Independence     Long-Range Goal: Mobility and Independence Optimized   Start Date: 08/24/2020  Expected End Date: 12/23/2020  Recent Progress: On track  Priority: High  Note:   Current Barriers:  . Chronic disease management support and education needs related to DM and PAD   . Limited access to caregiver . Unable to independently prepare meals  Social Worker Clinical Goal(s):  Marland Kitchen Over the next 120 days, patient will work with SW to identify and address any acute and/or chronic care coordination needs related to the self health management of DM and PAD   . Over the next 30 days the patient and his daughter will work with SW to identify caregiver resources for in the home . Over the next 10 days the patient will be placed on the wait list for mobile meals Goal Met  CCM SW Interventions: Completed with the patients daughter [name] . Inter-disciplinary care team collaboration (see longitudinal plan of care) . Collaboration with [name], [name] regarding development and update of comprehensive plan of care as evidenced by provider attestation and co-signature . Performed chart review prior to scheduled call - noted patient has been inpatient since 4.1.22 due to Cellulitis of left great toe, patient underwent amputation of left great toe on 4.4.22 . Successful outbound call placed to the patients daughter, [name] to assess for patient care needs post discharge . Discussed the patient will discharge home with home health - patient plans to [facility] with [facility] . [name] once the patients discharge is confirmed, SW will plan to order a month of transitional meals to assist with patient nutritional needs in the home . Collaboration with RN Care Manager to advise of patients current disposition and plan  Patient Goals/Self-Care Activities . Over the next 30 days, patient will: With the help of his  children  - Patient will self administer medications as prescribed Patient will attend all scheduled provider appointments Patient will call provider office for new concerns or questions Participate in home health upon hospital discharge Contact SW as needed prior to next scheduled call  Follow Up Plan:  SW will follow up with the patient over the next month       Follow Up Plan: SW will follow up with patient by phone over the next month      [name], [name], CDP Social  Worker, Certified Dementia Practitioner [place] [facility] [place] Management [phone]  Total time spent performing care coordination and/or care management activities with the patient by phone or face to face = 23 minutes.

## 2020-09-16 NOTE — TOC Progression Note (Signed)
Transition of Care [facility]) - Progression Note    Patient Details  Name: BODIE ABERNETHY MRN: [id number] Date of Birth: Jul 07, 1935  [facility] CM/SW Contact  Lourie Retz, Edson Snowball, RN Phone Number: 09/16/2020, 10:33 AM  Clinical Narrative:     Levada Dy with [name] confirmed patient is active with them. Angela aware discharge today.   Expected Discharge Plan: [place]    Expected Discharge Plan and Services Expected Discharge Plan: [place]   Discharge Planning Services: CM Consult Post Acute Care Choice: [place] arrangements for the past 2 months: Single Family Home                 DME Arranged: N/A DME Agency: NA       HH Arranged: PT,OT,RN           Social Determinants of Health (SDOH) Interventions    Readmission Risk Interventions Readmission Risk Prevention Plan 08/13/2020  Transportation Screening Complete  PCP or Specialist Appt within 3-5 Days Complete  HRI or [place] Complete  Social Work Consult for [place] Planning/Counseling Complete  Palliative Care Screening Not Applicable  Medication Review Press photographer) Complete  Some recent data might be hidden

## 2020-09-16 NOTE — Plan of Care (Signed)

## 2020-09-16 NOTE — Progress Notes (Signed)
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 PMH includes HTN, CKD stage III, chronid diastolic CHF, DM, CVA, diabetic neuropathy.    PT Comments    Pt performed progression of gt distance this am but fatigues quickly and required seated rest break between trials.  The darco shoe with prosthetic is very challenging for his balance.  Pt with poor proprioception.  LOB multiple times this session. He would really benefit from post acute rehab.  Pt is at a higher risk for falls and educated to perform transfers to [name] only until his strength and balance improves with HHPT.      Follow Up Recommendations  Home health PT;Supervision for mobility/OOB (refusing SNF placement.  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    Transfers Overall transfer level: Needs assistance Equipment used: Rolling walker (2 wheeled) Transfers: Sit to/from Stand Sit to Stand: Min assist;From elevated surface         General transfer comment: Cues for hand placement and forward weight shifting to and from seated surface.  Ambulation/Gait Ambulation/Gait assistance: Min assist;Mod assist  (required increased assistance when he starts to fatique.  Poor activity tolerance.) Gait Distance (Feet): 18 Feet (+ 12 ft. seated rest break between trials due to fatigue.) Assistive device: Rolling walker (2 wheeled) Gait Pattern/deviations: Step-to pattern;Trunk flexed;Decreased step length - right;Decreased step length - left;Decreased stance time - left;Decreased weight shift to left Gait velocity: decreased   General Gait Details: Slow, unsteady gait with difficulty advancing LLE needing assist initially, left knee instability, heavy reliance on UEs.  LOB balance noted x 3. Poor ability to stay in RW to provide support.  At increased risk for falls.   Stairs             Wheelchair Mobility    Modified Rankin (Stroke Patients Only)       Balance Overall balance assessment: Needs assistance Sitting-balance support: Feet supported;No upper extremity supported Sitting balance-Leahy Scale: Good Sitting balance - Comments: Able to donn prosthesis liner and sock.  Increased assistance to donn leg this session.   Standing balance support: During functional activity Standing balance-Leahy Scale: Poor Standing balance comment: Requires UE support and external support, multiple LOB during dynamic gt activities.                            Cognition Arousal/Alertness: Awake/alert Behavior During Therapy: WFL for tasks assessed/performed Overall Cognitive Status: Within Functional Limits for tasks assessed  General Comments: for basic mobility tasks. Awareness that he is likely not ready to d/c home today.      Exercises      General Comments        Pertinent Vitals/Pain Pain Assessment: Faces Faces Pain Scale: Hurts little more Pain Location: left foot Pain Descriptors / Indicators: Throbbing Pain Intervention(s): Monitored during session;Repositioned    Home Living                      Prior  Function            PT Goals (current goals can now be found in the care plan section) Acute Rehab PT Goals Patient Stated Goal: to go home Potential to Achieve Goals: Good Progress towards PT goals: Progressing toward goals    Frequency    Min 3X/week      PT Plan Current plan remains appropriate    Co-evaluation              AM-PAC PT "6 Clicks" Mobility   Outcome Measure  Help needed turning from your back to your side while in a flat bed without using bedrails?: A Little Help needed moving from lying on your back to sitting on the side of a flat bed without using bedrails?: A Little Help needed moving to and from a bed to a chair (including a wheelchair)?: A Little Help needed standing up from a chair using your arms (e.g., wheelchair or bedside chair)?: A Little Help needed to walk in hospital room?: A Lot Help needed climbing 3-5 steps with a railing? : A Lot 6 Click Score: 16    End of Session Equipment Utilized During Treatment: Gait belt Activity Tolerance: Patient tolerated treatment well Patient left: in chair;with call bell/phone within reach;with chair alarm set Nurse Communication: Mobility status PT Visit Diagnosis: Pain;Muscle weakness (generalized) (M62.81);Other abnormalities of gait and mobility (R26.89) Pain - Right/Left: Left Pain - part of body: Ankle and joints of foot     Time: 1018-1050 PT Time Calculation (min) (ACUTE ONLY): 32 min  Charges:  $Gait Training: 8-22 mins $Therapeutic Activity: 8-22 mins                     Micheal Walls , PTA Acute Rehabilitation Services Pager [phone] Office [phone]    Micheal Walls 09/16/2020, 11:22 AM

## 2020-09-16 NOTE — Patient Instructions (Signed)
Social Worker Visit Information  Goals we discussed today:  Goals Addressed            This Visit's Progress   . Mobility and Independence Optimized       Timeframe:  Long-Range Goal Priority:  High Start Date:  3.14.22                          Expected End Date: 7.13.22                       Next planned outreach: 4.27.22  Patient Goals/Self-Care Activities . Over the next 30 days, patient will: With the help of his children  - Patient will self administer medications as prescribed Patient will attend all scheduled provider appointments Patient will call provider office for new concerns or questions Participate in home health upon discharge Contact SW as needed prior to next scheduled call        Materials Provided: Verbal education about food resources provided by phone  Follow Up Plan: SW will follow up with patient by phone over the next month   [name], [name], [name], Certified Dementia Practitioner [place] [facility] [place] [facility] [phone]

## 2020-09-16 NOTE — Discharge Instructions (Signed)
Osteomyelitis, Adult  Bone infections, also called osteomyelitis,occur when bacteria or other germs get inside a bone. This can happen if you have an infection in another part of your body that spreads through your blood. It can also happen if you have a wound or a broken bone (fracture) that breaks the skin. A wound or a fracture can allow germs from your skin or from outside of your body to spread to your bone. Bone infections need to be treated quickly to:  Prevent bone damage.  Prevent the infection from spreading to other areas of your body. What are the causes? Most bone infections are caused by bacteria. The most common bacteria is one found on the skin (staphylococcus). Bone infections can also be caused by other germs, such as viruses and funguses. What increases the risk? You are more likely to develop this condition if:  You recently had surgery, especially bone or joint surgery.  You have had an injury, such as stepping on a nail or having a fracture that exposes bones through the skin.  You have a long-term (chronic) disease, such as: ? Diabetes. ? HIV (human immunodeficiency virus). ? Rheumatoid arthritis. ? Sickle cell anemia. ? Kidney disease that requires dialysis.  You have a condition that affects your body's defense system (immune system) or you take medicines that block or weaken the immune system.  You have a condition that reduces your blood flow.  You have an artificial joint.  You have had a joint or bone repaired with plates or screws.  You use IV drugs. What are the signs or symptoms? Symptoms vary depending on the type and location of your infection. Common symptoms of bone infections include:  Fever and chills.  Skin redness and warmth.  Swelling.  Pain and stiffness.  Drainage of fluid or pus near the infection. How is this diagnosed? This condition may be diagnosed based on:  Your symptoms and medical history.  A physical  exam.  Tests, such as: ? A sample of tissue, fluid, or blood taken to be examined under a microscope. ? Pus or discharge swabbed from a wound for testing. This is to identify the type of germs and to determine what type of medicine will kill them. ? Blood tests.  Imaging studies, including X-rays, MRI, CT scan, bone scan, or ultrasound. How is this treated? Treatment for this condition depends on the cause and type of infection. Antibiotic medicines are usually the first treatment for a bone infection. This may be done in a hospital at first. You may have to continue IV antibiotics at home or take antibiotics by mouth for several weeks after that. Other treatments may include surgery to:  Remove dead or dying tissue from a bone.  Remove an infected artificial joint.  Remove infected plates or screws that were used to repair a broken bone. Follow these instructions at home: Medicines  Take over-the-counter and prescription medicines as told by your health care provider. Finish all antibiotic medicine even if you start to feel better.  Follow instructions from your health care provider about how to take IV antibiotics at home. You may need to have a nurse come to your home to give you the IV antibiotics. Managing pain, stiffness, and swelling If directed, put ice on the affected area. To do this:  Put ice in a plastic bag.  Place a towel between your skin and the bag.  Leave the ice on for 20 minutes, 2-3 times a day.   General  instructions  Ask your health care provider if you have any restrictions on your activities.  Do not use any products that contain nicotine or tobacco, such as cigarettes, e-cigarettes, and chewing tobacco. If you need help quitting, ask your health care provider.  Keep all follow-up visits as told by your health care provider. This is important. How is this prevented?  Wash your hands often to stop the spread of germs. Wash your hands for at least 20  seconds with soap and water. If soap and water are not available, use hand sanitizer.  Keep any open areas, cuts, or wounds clean. Apply a clean bandage after cleaning the area.  Check wounds frequently for signs of infections. Signs of infection include redness, swelling, warmth, pus, or a bad smell.  Wear proper footwear to avoid injuries to the feet. Contact a health care provider if:  You develop a fever or chills.  You have redness, warmth, pain, or swelling that returns after treatment. Get help right away if:  You have rapid breathing or you have trouble breathing.  You have chest pain.  You cannot drink fluids or make urine.  The affected area swells, changes color, or turns blue.  You have numbness or severe pain in the affected area. These symptoms may represent a serious problem that is an emergency. Do not wait to see if the symptoms will go away. Get medical help right away. Call your local emergency services (911 in the [place] Do not drive yourself to the hospital. Summary  Bone infections, also called osteomyelitis,occur when bacteria or other germs get inside a bone.  You are more likely to get this type of infection if you have a condition that lowers your ability to fight infections. You are also likely to get this condition if you take medicines that block or weaken the immune system.  Most bone infections are caused by bacteria. They can also be caused by other germs, such as viruses and funguses.  Treatment for this condition usually starts with taking antibiotics. Further treatment depends on the cause and type of infection. This information is not intended to replace advice given to you by your health care provider. Make sure you discuss any questions you have with your health care provider. Document Revised: 08/15/2019 Document Reviewed: 08/15/2019 Elsevier Patient Education  2021 [place].   Cellulitis, Adult  Cellulitis is a skin infection. The  infected area is often warm, red, swollen, and sore. It occurs most often in the arms and lower legs. It is very important to get treated for this condition. What are the causes? This condition is caused by bacteria. The bacteria enter through a break in the skin, such as a cut, burn, insect bite, open sore, or crack. What increases the risk? This condition is more likely to occur in people who:  Have a weak body defense system (immune system).  Have open cuts, burns, bites, or scrapes on the skin.  Are older than 83 years of age.  Have a blood sugar problem (diabetes).  Have a long-lasting (chronic) liver disease (cirrhosis) or kidney disease.  Are very overweight (obese).  Have a skin problem, such as: ? Itchy rash (eczema). ? Slow movement of blood in the veins (venous stasis). ? Fluid buildup below the skin (edema).  Have been treated with high-energy rays (radiation).  Use IV drugs. What are the signs or symptoms? Symptoms of this condition include:  Skin that is: ? Red. ? Streaking. ? Spotting. ? Swollen. ?  Sore or painful when you touch it. ? Warm.  A fever.  Chills.  Blisters. How is this diagnosed? This condition is diagnosed based on:  Medical history.  Physical exam.  Blood tests.  Imaging tests. How is this treated? Treatment for this condition may include:  Medicines to treat infections or allergies.  Home care, such as: ? Rest. ? Placing cold or warm cloths (compresses) on the skin.  Hospital care, if the condition is very bad. Follow these instructions at home: Medicines  Take over-the-counter and prescription medicines only as told by your doctor.  If you were prescribed an antibiotic medicine, take it as told by your doctor. Do not stop taking it even if you start to feel better. General instructions  Drink enough fluid to keep your pee (urine) pale yellow.  Do not touch or rub the infected area.  Raise (elevate) the infected  area above the level of your heart while you are sitting or lying down.  Place cold or warm cloths on the area as told by your doctor.  Keep all follow-up visits as told by your doctor. This is important.   Contact a doctor if:  You have a fever.  You do not start to get better after 1-2 days of treatment.  Your bone or joint under the infected area starts to hurt after the skin has healed.  Your infection comes back. This can happen in the same area or another area.  You have a swollen bump in the area.  You have new symptoms.  You feel ill and have muscle aches and pains. Get help right away if:  Your symptoms get worse.  You feel very sleepy.  You throw up (vomit) or have watery poop (diarrhea) for a long time.  You see red streaks coming from the area.  Your red area gets larger.  Your red area turns dark in color. These symptoms may represent a serious problem that is an emergency. Do not wait to see if the symptoms will go away. Get medical help right away. Call your local emergency services (911 in the [place] Do not drive yourself to the hospital. Summary  Cellulitis is a skin infection. The area is often warm, red, swollen, and sore.  This condition is treated with medicines, rest, and cold and warm cloths.  Take all medicines only as told by your doctor.  Tell your doctor if symptoms do not start to get better after 1-2 days of treatment. This information is not intended to replace advice given to you by your health care provider. Make sure you discuss any questions you have with your health care provider. Document Revised: 10/19/2017 Document Reviewed: 10/19/2017 Elsevier Patient Education  [place].

## 2020-09-17 ENCOUNTER — Ambulatory Visit: Payer: Medicare Other

## 2020-09-17 ENCOUNTER — Telehealth: Payer: Self-pay

## 2020-09-17 ENCOUNTER — Telehealth: Payer: Medicare Other

## 2020-09-17 ENCOUNTER — Ambulatory Visit: Payer: Self-pay

## 2020-09-17 DIAGNOSIS — Z794 Long term (current) use of insulin: Secondary | ICD-10-CM

## 2020-09-17 DIAGNOSIS — I739 Peripheral vascular disease, unspecified: Secondary | ICD-10-CM

## 2020-09-17 DIAGNOSIS — G629 Polyneuropathy, unspecified: Secondary | ICD-10-CM

## 2020-09-17 DIAGNOSIS — L03116 Cellulitis of left lower limb: Secondary | ICD-10-CM

## 2020-09-17 DIAGNOSIS — R531 Weakness: Secondary | ICD-10-CM | POA: Diagnosis not present

## 2020-09-17 DIAGNOSIS — L97522 Non-pressure chronic ulcer of other part of left foot with fat layer exposed: Secondary | ICD-10-CM | POA: Diagnosis not present

## 2020-09-17 DIAGNOSIS — A419 Sepsis, unspecified organism: Secondary | ICD-10-CM | POA: Diagnosis not present

## 2020-09-17 DIAGNOSIS — E1159 Type 2 diabetes mellitus with other circulatory complications: Secondary | ICD-10-CM

## 2020-09-17 DIAGNOSIS — E11621 Type 2 diabetes mellitus with foot ulcer: Secondary | ICD-10-CM | POA: Diagnosis not present

## 2020-09-17 DIAGNOSIS — I69398 Other sequelae of cerebral infarction: Secondary | ICD-10-CM | POA: Diagnosis not present

## 2020-09-17 DIAGNOSIS — E782 Mixed hyperlipidemia: Secondary | ICD-10-CM

## 2020-09-17 NOTE — Telephone Encounter (Signed)
Can you call to do his TCM and schedule in 1-2 weeks hospital follow up

## 2020-09-17 NOTE — Telephone Encounter (Signed)
Transition Care Management Follow-up Telephone Call  Date of discharge and from where: 09/16/2020  How have you been since you were released from the hospital? Pretty good  Any questions or concerns? No  Items Reviewed:  Did the pt receive and understand the discharge instructions provided? Yes  Medications obtained and verified? Yes  Any new allergies since your discharge? No  Dietary orders reviewed? Yes  Do you have support at home? Yes  Other (ie: DME, Home Health, etc) Yes  Functional Questionnaire: (I = Independent and D = Dependent)  Bathing/Dressing- I   Meal Prep- I  Eating- I  Maintaining continence- I  Transferring/Ambulation- I  Managing Meds- I   Follow up appointments reviewed:    PCP Hospital f/u appt confirmed? Yes  Specialist Hospital f/u appt confirmed? Yes  Are transportation arrangements needed? Yes  If their condition worsens, is the pt aware to call  their PCP or go to the ED? Yes  Was the patient provided with contact information for the PCP's office or ED? Yes  Was the pt encouraged to call back with questions or concerns? Yes

## 2020-09-17 NOTE — Chronic Care Management (AMB) (Signed)
Chronic Care Management    Social Work Note  09/17/2020 Name: Micheal Walls MRN: [id number] DOB: 1935/07/20  Micheal Walls is a 85 y.o. year old male who is a primary care patient of [name], [place]. The CCM team was consulted to assist the patient with chronic disease management and/or care coordination needs related to: [employer] .   Collaboration with [employer] for meal delivery in response to provider referral for social work chronic care management and care coordination services.   Consent to Services:  The patient was given information about Chronic Care Management services, agreed to services, and gave verbal consent prior to initiation of services.  Please see initial visit note for detailed documentation.   Patient agreed to services and consent obtained.   Assessment: Review of patient past medical history, allergies, medications, and health status, including review of relevant consultants reports was performed today as part of a comprehensive evaluation and provision of chronic care management and care coordination services.     SDOH (Social Determinants of Health) assessments and interventions performed:    Advanced Directives Status: Not addressed in this encounter.  CCM Care Plan  Allergies  Allergen Reactions  . Vancomycin Rash    Outpatient Encounter Medications as of 09/17/2020  Medication Sig Note  . allopurinol (ZYLOPRIM) 100 MG tablet Take 100 mg by mouth daily.   Marland Kitchen amoxicillin-clavulanate (AUGMENTIN) 875-125 MG tablet Take 1 tablet by mouth every 12 (twelve) hours.   Marland Kitchen aspirin EC 81 MG tablet Take 81 mg by mouth daily. Swallow whole.   . Biotin 5000 MCG CAPS Take 5,000 mcg by mouth daily.   Micheal Walls Paint Modified 1.5 % LIQD Apply between toes as needed for maceration.   . clopidogrel (PLAVIX) 75 MG tablet Take 75 mg by mouth daily.   . colchicine 0.6 MG tablet Take 0.6 mg by mouth daily as needed (gout flare up).   . Continuous Blood  Gluc Sensor (FREESTYLE LIBRE 14 DAY SENSOR) MISC Use as directed to check blood sugars   . diltiazem (CARDIZEM CD) 300 MG 24 hr capsule Take 300 mg by mouth daily.   . dorzolamide (TRUSOPT) 2 % ophthalmic solution Place 1 drop into both eyes at bedtime.   Marland Kitchen doxycycline (VIBRA-TABS) 100 MG tablet Take 1 tablet (100 mg total) by mouth every 12 (twelve) hours.   . ferrous sulfate 325 (65 FE) MG tablet Take 325 mg by mouth every Monday, Wednesday, and Friday.   . fish oil-omega-3 fatty acids 1000 MG capsule Take 2 g by mouth daily.   . furosemide (LASIX) 40 MG tablet Take 2 tabs in morning and 1 tab evening (Patient taking differently: Take 40-80 mg by mouth 2 (two) times daily. 80 mg in the morning and 40 mg at night) 08/09/2020: 80 mg in the morning & 40 at bedtime   . gabapentin (NEURONTIN) 100 MG capsule Take 2 capsules in am 1 capsule mid day and 1 capsule in evening   . glucose blood (FREESTYLE PRECISION NEO TEST) test strip Use as instructed   . HYDROcodone-acetaminophen (NORCO/VICODIN) 5-325 MG tablet Take 1 tablet by mouth every 6 (six) hours as needed for moderate pain.   Marland Kitchen latanoprost (XALATAN) 0.005 % ophthalmic solution Place 1 drop into both eyes at bedtime.    . Magnesium 250 MG TABS Take 1 tablet by mouth daily.   . Multiple Vitamin (MULTIVITAMIN WITH MINERALS) TABS tablet Take 1 tablet by mouth daily.   . Multiple Vitamins-Minerals (PRESERVISION AREDS  PO) Take 1 tablet by mouth daily.   . pantoprazole (PROTONIX) 40 MG tablet Take 1 tablet  by mouth daily. (Patient taking differently: Take 40 mg by mouth at bedtime.)   . Polyvinyl Alcohol-Povidone (REFRESH OP) Place 1 drop into both eyes daily as needed (dry eyes).   . rosuvastatin (CRESTOR) 10 MG tablet Take 1 tablet (10 mg total) by mouth daily. (Patient taking differently: Take 10 mg by mouth every 7 (seven) days. Wednesday of each week)   . Semaglutide, [name], (OZEMPIC, [name],) 2 MG/1.5ML SOPN Inject 1 mg into the skin once a  week. (Patient taking differently: Inject 1 mg into the skin every Wednesday.)   . tamsulosin (FLOMAX) 0.4 MG CAPS capsule Take 0.4 mg by mouth daily.    No facility-administered encounter medications on file as of 09/17/2020.    Patient Active Problem List   Diagnosis Date Noted  . 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[place])   . Left ankle swelling   . Diabetic foot infection ([place])   . Cerebral thrombosis with cerebral infarction 07/20/2020  . Thrombocytopenia ([place]) 07/19/2020  . Cerebral ischemia 07/18/2020  . Bilateral hearing loss 05/20/2020  . Does use hearing aid 05/20/2020  . Diabetic ulcer of left great toe ([place]) 01/16/2020  . Controlled type 2 diabetes mellitus with stable proliferative retinopathy of both eyes, with long-term current use of insulin ([place]) 01/06/2020  . Intermediate stage nonexudative age-related macular degeneration of both eyes 01/06/2020  . Left epiretinal membrane 01/06/2020  . [place] of right macula 01/06/2020  . AKI (acute kidney injury) ([place]) 11/02/2019  . Cellulitis 11/02/2019  . Elevated TSH 07/10/2018  . Nephropathy 02/28/2018  . Disturbance of skin sensation 02/28/2018  . PAD (peripheral artery disease) ([place]) 07/17/2017  . 110.1 10/21/2013  . Epiphora 04/24/2013  . Laxity of eyelid 04/24/2013  . Anemia 04/09/2013  . History of peripheral vascular disease 04/09/2013  . History of stroke 04/09/2013  . S/P unilateral BKA (below knee amputation) ([place]) 03/17/2013  . Diabetes mellitus type 2 in obese ([place]) 03/17/2013  . Mixed hyperlipidemia 03/17/2013  . Essential hypertension 03/17/2013  . Gout 03/17/2013  . CKD (chronic kidney disease), stage III ([place]) 03/17/2013  . OSA on CPAP 03/17/2013  . Anemia, iron deficiency 03/17/2013  . Diverticulosis of colon 03/17/2013  . RBBB 03/17/2013  . Punctal stenosis, acquired 10/02/2012    Conditions to be addressed/monitored: DMII and PAD  Care Plan : Social Work [facility] Plan of Care  Updates made by [name]  since 09/17/2020 12:00 AM    Problem: Mobility and Independence     Long-Range Goal: Mobility and Independence Optimized   Start Date: 08/24/2020  Expected End Date: 12/23/2020  Recent Progress: On track  Priority: High  Note:   Current Barriers:  . Chronic disease management support and education needs related to DM and PAD   . Limited access to caregiver . Unable to independently prepare meals  Social Worker Clinical Goal(s):  Marland Kitchen Over the next 120 days, patient will work with SW to identify and address any acute and/or chronic care coordination needs related to the self health management of DM and PAD   . Over the next 30 days the patient and his daughter will work with SW to identify caregiver resources for in the home . Over the next 10 days the patient will be placed on the wait list for mobile meals Goal Met  CCM SW Interventions: Completed with the [name] . Inter-disciplinary care team  collaboration (see longitudinal plan of care) . Collaboration with [name], [name] regarding development and update of comprehensive plan of care as evidenced by provider attestation and co-signature . Performed chart review to confirm patient discharged home . Placed referral to [employer] for 30 day meal program  Patient Goals/Self-Care Activities . Over the next 30 days, patient will: With the help of his children  - Patient will self administer medications as prescribed Patient will attend all scheduled provider appointments Patient will call provider office for new concerns or questions Participate in home health upon hospital discharge Contact SW as needed prior to next scheduled call  Follow Up Plan:  SW will follow up with the patient over the next month       Follow Up Plan: SW will follow up with patient by phone over the next month      [name], [name], [name], Certified Dementia Practitioner [place] [facility] [place]  Management [phone]  Total time spent performing care coordination and/or care management activities with the patient by phone or face to face = 10 minutes.

## 2020-09-19 LAB — AEROBIC/ANAEROBIC CULTURE W GRAM STAIN (SURGICAL/DEEP WOUND)

## 2020-09-21 ENCOUNTER — Encounter (HOSPITAL_BASED_OUTPATIENT_CLINIC_OR_DEPARTMENT_OTHER): Payer: Medicare Other | Admitting: Internal Medicine

## 2020-09-22 DIAGNOSIS — E11621 Type 2 diabetes mellitus with foot ulcer: Secondary | ICD-10-CM | POA: Diagnosis not present

## 2020-09-22 DIAGNOSIS — L03116 Cellulitis of left lower limb: Secondary | ICD-10-CM | POA: Diagnosis not present

## 2020-09-22 DIAGNOSIS — R531 Weakness: Secondary | ICD-10-CM | POA: Diagnosis not present

## 2020-09-22 DIAGNOSIS — L97522 Non-pressure chronic ulcer of other part of left foot with fat layer exposed: Secondary | ICD-10-CM | POA: Diagnosis not present

## 2020-09-22 DIAGNOSIS — A419 Sepsis, unspecified organism: Secondary | ICD-10-CM | POA: Diagnosis not present

## 2020-09-22 DIAGNOSIS — I69398 Other sequelae of cerebral infarction: Secondary | ICD-10-CM | POA: Diagnosis not present

## 2020-09-25 DIAGNOSIS — L97522 Non-pressure chronic ulcer of other part of left foot with fat layer exposed: Secondary | ICD-10-CM | POA: Diagnosis not present

## 2020-09-25 DIAGNOSIS — E11621 Type 2 diabetes mellitus with foot ulcer: Secondary | ICD-10-CM | POA: Diagnosis not present

## 2020-09-25 DIAGNOSIS — L03116 Cellulitis of left lower limb: Secondary | ICD-10-CM | POA: Diagnosis not present

## 2020-09-25 DIAGNOSIS — A419 Sepsis, unspecified organism: Secondary | ICD-10-CM | POA: Diagnosis not present

## 2020-09-25 DIAGNOSIS — I69398 Other sequelae of cerebral infarction: Secondary | ICD-10-CM | POA: Diagnosis not present

## 2020-09-25 DIAGNOSIS — R531 Weakness: Secondary | ICD-10-CM | POA: Diagnosis not present

## 2020-09-28 ENCOUNTER — Telehealth: Payer: Self-pay

## 2020-09-28 ENCOUNTER — Other Ambulatory Visit: Payer: Self-pay

## 2020-09-28 ENCOUNTER — Ambulatory Visit (INDEPENDENT_AMBULATORY_CARE_PROVIDER_SITE_OTHER): Payer: Medicare Other | Admitting: Nurse Practitioner

## 2020-09-28 VITALS — BP 138/70 | HR 78 | Temp 98.1°F

## 2020-09-28 DIAGNOSIS — M869 Osteomyelitis, unspecified: Secondary | ICD-10-CM | POA: Diagnosis not present

## 2020-09-28 DIAGNOSIS — L97522 Non-pressure chronic ulcer of other part of left foot with fat layer exposed: Secondary | ICD-10-CM | POA: Diagnosis not present

## 2020-09-28 DIAGNOSIS — R531 Weakness: Secondary | ICD-10-CM | POA: Diagnosis not present

## 2020-09-28 DIAGNOSIS — E1159 Type 2 diabetes mellitus with other circulatory complications: Secondary | ICD-10-CM

## 2020-09-28 DIAGNOSIS — I69398 Other sequelae of cerebral infarction: Secondary | ICD-10-CM | POA: Diagnosis not present

## 2020-09-28 DIAGNOSIS — I10 Essential (primary) hypertension: Secondary | ICD-10-CM | POA: Diagnosis not present

## 2020-09-28 DIAGNOSIS — Z794 Long term (current) use of insulin: Secondary | ICD-10-CM

## 2020-09-28 DIAGNOSIS — Z89611 Acquired absence of right leg above knee: Secondary | ICD-10-CM

## 2020-09-28 DIAGNOSIS — I739 Peripheral vascular disease, unspecified: Secondary | ICD-10-CM

## 2020-09-28 DIAGNOSIS — E11621 Type 2 diabetes mellitus with foot ulcer: Secondary | ICD-10-CM | POA: Diagnosis not present

## 2020-09-28 DIAGNOSIS — A419 Sepsis, unspecified organism: Secondary | ICD-10-CM | POA: Diagnosis not present

## 2020-09-28 DIAGNOSIS — L03116 Cellulitis of left lower limb: Secondary | ICD-10-CM | POA: Diagnosis not present

## 2020-09-28 LAB — POCT URINALYSIS DIPSTICK
Bilirubin, UA: NEGATIVE
Blood, UA: NEGATIVE
Glucose, UA: NEGATIVE
Ketones, UA: NEGATIVE
Leukocytes, UA: NEGATIVE
Nitrite, UA: NEGATIVE
Protein, UA: NEGATIVE
Spec Grav, UA: 1.01 (ref 1.010–1.025)
Urobilinogen, UA: 0.2 E.U./dL
pH, UA: 5.5 (ref 5.0–8.0)

## 2020-09-28 LAB — POCT UA - MICROALBUMIN
Albumin/Creatinine Ratio, Urine, POC: 30
Creatinine, POC: 100 mg/dL
Microalbumin Ur, POC: 30 mg/L

## 2020-09-28 NOTE — Patient Instructions (Signed)
Osteomyelitis, Adult  Bone infections, also called osteomyelitis,occur when bacteria or other germs get inside a bone. This can happen if you have an infection in another part of your body that spreads through your blood. It can also happen if you have a wound or a broken bone (fracture) that breaks the skin. A wound or a fracture can allow germs from your skin or from outside of your body to spread to your bone. Bone infections need to be treated quickly to:  Prevent bone damage.  Prevent the infection from spreading to other areas of your body. What are the causes? Most bone infections are caused by bacteria. The most common bacteria is one found on the skin (staphylococcus). Bone infections can also be caused by other germs, such as viruses and funguses. What increases the risk? You are more likely to develop this condition if:  You recently had surgery, especially bone or joint surgery.  You have had an injury, such as stepping on a nail or having a fracture that exposes bones through the skin.  You have a long-term (chronic) disease, such as: ? Diabetes. ? HIV (human immunodeficiency virus). ? Rheumatoid arthritis. ? Sickle cell anemia. ? Kidney disease that requires dialysis.  You have a condition that affects your body's defense system (immune system) or you take medicines that block or weaken the immune system.  You have a condition that reduces your blood flow.  You have an artificial joint.  You have had a joint or bone repaired with plates or screws.  You use IV drugs. What are the signs or symptoms? Symptoms vary depending on the type and location of your infection. Common symptoms of bone infections include:  Fever and chills.  Skin redness and warmth.  Swelling.  Pain and stiffness.  Drainage of fluid or pus near the infection. How is this diagnosed? This condition may be diagnosed based on:  Your symptoms and medical history.  A physical  exam.  Tests, such as: ? A sample of tissue, fluid, or blood taken to be examined under a microscope. ? Pus or discharge swabbed from a wound for testing. This is to identify the type of germs and to determine what type of medicine will kill them. ? Blood tests.  Imaging studies, including X-rays, MRI, CT scan, bone scan, or ultrasound. How is this treated? Treatment for this condition depends on the cause and type of infection. Antibiotic medicines are usually the first treatment for a bone infection. This may be done in a hospital at first. You may have to continue IV antibiotics at home or take antibiotics by mouth for several weeks after that. Other treatments may include surgery to:  Remove dead or dying tissue from a bone.  Remove an infected artificial joint.  Remove infected plates or screws that were used to repair a broken bone. Follow these instructions at home: Medicines  Take over-the-counter and prescription medicines as told by your health care provider. Finish all antibiotic medicine even if you start to feel better.  Follow instructions from your health care provider about how to take IV antibiotics at home. You may need to have a nurse come to your home to give you the IV antibiotics. Managing pain, stiffness, and swelling If directed, put ice on the affected area. To do this:  Put ice in a plastic bag.  Place a towel between your skin and the bag.  Leave the ice on for 20 minutes, 2-3 times a day.   General  instructions  Ask your health care provider if you have any restrictions on your activities.  Do not use any products that contain nicotine or tobacco, such as cigarettes, e-cigarettes, and chewing tobacco. If you need help quitting, ask your health care provider.  Keep all follow-up visits as told by your health care provider. This is important. How is this prevented?  Wash your hands often to stop the spread of germs. Wash your hands for at least 20  seconds with soap and water. If soap and water are not available, use hand sanitizer.  Keep any open areas, cuts, or wounds clean. Apply a clean bandage after cleaning the area.  Check wounds frequently for signs of infections. Signs of infection include redness, swelling, warmth, pus, or a bad smell.  Wear proper footwear to avoid injuries to the feet. Contact a health care provider if:  You develop a fever or chills.  You have redness, warmth, pain, or swelling that returns after treatment. Get help right away if:  You have rapid breathing or you have trouble breathing.  You have chest pain.  You cannot drink fluids or make urine.  The affected area swells, changes color, or turns blue.  You have numbness or severe pain in the affected area. These symptoms may represent a serious problem that is an emergency. Do not wait to see if the symptoms will go away. Get medical help right away. Call your local emergency services (911 in the [place] Do not drive yourself to the hospital. Summary  Bone infections, also called osteomyelitis,occur when bacteria or other germs get inside a bone.  You are more likely to get this type of infection if you have a condition that lowers your ability to fight infections. You are also likely to get this condition if you take medicines that block or weaken the immune system.  Most bone infections are caused by bacteria. They can also be caused by other germs, such as viruses and funguses.  Treatment for this condition usually starts with taking antibiotics. Further treatment depends on the cause and type of infection. This information is not intended to replace advice given to you by your health care provider. Make sure you discuss any questions you have with your health care provider. Document Revised: 08/15/2019 Document Reviewed: 08/15/2019 Elsevier Patient Education  [place].

## 2020-09-28 NOTE — Addendum Note (Signed)
Addended by: Luana Shu on: 09/28/2020 03:59 PM   Modules accepted: Orders

## 2020-09-28 NOTE — Telephone Encounter (Signed)
Genevieve from [facility] called requesting verbal orders for PT twice a week for 4 weeks and once a week for 2 weeks. [phone]  I returned her call and gave verbal orders ok YL,RMA

## 2020-09-28 NOTE — Progress Notes (Signed)
I,Tianna Badgett,acting as a Education administrator for [employer], NP.,have documented all relevant documentation on the behalf of [employer], NP,as directed by  Bary Castilla, NP while in the presence of Bary Castilla, NP.  This visit occurred during the SARS-CoV-2 public health emergency.  Safety protocols were in place, including screening questions prior to the visit, additional usage of staff PPE, and extensive cleaning of exam room while observing appropriate contact time as indicated for disinfecting solutions.  Subjective:     Patient ID: Micheal Walls , male    DOB: June 14, 1935 , 85 y.o.   MRN: [id number]   Chief Complaint  Patient presents with  . Hospitalization Follow-up    HPI The patient is here for a hospital F/U. He does have a history of PAD and DM. He was admitted to the hospital with worsening wound of his great left toe. He was seen by the vascular surgeon at the hospital. Vascular podiatry consultation was done and they determined that the patient needed amputation of the left great toe. He was discharged from the hospital with antx consisting of Augmentin and doxycycline. He was also given hydrocodone for pain for which he states he has not been taking since last Tuesday. He has a F/U appt with podiatry Dr. Sherryle Lis tom. For his amputation. The daily dressing has not been changed as instructed by discharge at the hospital. Podiatry will changed dressing, reassess his toe and antx with the follow up visit on 09/29/20. The patient has been monitoring his BS at home and has been compliant with taking all of his medication as directed.     Past Medical History:  Diagnosis Date  . Acute metabolic encephalopathy 07/16/7626  . Amputated below knee ([place])    right  . Anemia   . Aspiration pneumonia ([place]) 11/02/2019  . Diastolic heart failure ([place])   . Diverticulosis   . DM (diabetes mellitus) ([place])   . Gout   . Hyperlipemia   . OSA on CPAP   . RBBB   . Sepsis ([place])  11/02/2019  . Systemic hypertension      Family History  Problem Relation Age of Onset  . Diabetes Mother   . Heart attack Father   . Cancer Sister      Current Outpatient Medications:  .  allopurinol (ZYLOPRIM) 100 MG tablet, Take 100 mg by mouth daily., Disp: , Rfl:  .  amoxicillin-clavulanate (AUGMENTIN) 875-125 MG tablet, Take 1 tablet by mouth every 12 (twelve) hours., Disp: 28 tablet, Rfl: 0 .  aspirin EC 81 MG tablet, Take 81 mg by mouth daily. Swallow whole., Disp: , Rfl:  .  Biotin 5000 MCG CAPS, Take 5,000 mcg by mouth daily., Disp: , Rfl:  .  Castellani Paint Modified 1.5 % LIQD, Apply between toes as needed for maceration., Disp: 30 mL, Rfl: 3 .  clopidogrel (PLAVIX) 75 MG tablet, Take 75 mg by mouth daily., Disp: , Rfl:  .  colchicine 0.6 MG tablet, Take 0.6 mg by mouth daily as needed (gout flare up)., Disp: , Rfl:  .  Continuous Blood Gluc Sensor (FREESTYLE LIBRE 14 DAY SENSOR) MISC, Use as directed to check blood sugars, Disp: 6 each, Rfl: 1 .  diltiazem (CARDIZEM CD) 300 MG 24 hr capsule, Take 300 mg by mouth daily., Disp: , Rfl:  .  dorzolamide (TRUSOPT) 2 % ophthalmic solution, Place 1 drop into both eyes at bedtime., Disp: , Rfl:  .  doxycycline (VIBRA-TABS) 100 MG tablet, Take 1 tablet (100 mg  total) by mouth every 12 (twelve) hours., Disp: 28 tablet, Rfl: 0 .  ferrous sulfate 325 (65 FE) MG tablet, Take 325 mg by mouth every Monday, Wednesday, and Friday., Disp: , Rfl:  .  fish oil-omega-3 fatty acids 1000 MG capsule, Take 2 g by mouth daily., Disp: , Rfl:  .  furosemide (LASIX) 40 MG tablet, Take 2 tabs in morning and 1 tab evening (Patient taking differently: Take 40-80 mg by mouth 2 (two) times daily. 80 mg in the morning and 40 mg at night), Disp: 180 tablet, Rfl: 0 .  gabapentin (NEURONTIN) 100 MG capsule, Take 2 capsules in am 1 capsule mid day and 1 capsule in evening, Disp: 270 capsule, Rfl: 0 .  glucose blood (FREESTYLE PRECISION NEO TEST) test strip, Use as  instructed, Disp: 100 each, Rfl: 12 .  HYDROcodone-acetaminophen (NORCO/VICODIN) 5-325 MG tablet, Take 1 tablet by mouth every 6 (six) hours as needed for moderate pain., Disp: 20 tablet, Rfl: 0 .  latanoprost (XALATAN) 0.005 % ophthalmic solution, Place 1 drop into both eyes at bedtime. , Disp: , Rfl:  .  Magnesium 250 MG TABS, Take 1 tablet by mouth daily., Disp: , Rfl:  .  Multiple Vitamin (MULTIVITAMIN WITH MINERALS) TABS tablet, Take 1 tablet by mouth daily., Disp: , Rfl:  .  Multiple Vitamins-Minerals (PRESERVISION AREDS PO), Take 1 tablet by mouth daily., Disp: , Rfl:  .  pantoprazole (PROTONIX) 40 MG tablet, Take 1 tablet  by mouth daily. (Patient taking differently: Take 40 mg by mouth at bedtime.), Disp: 90 tablet, Rfl: 0 .  Polyvinyl Alcohol-Povidone (REFRESH OP), Place 1 drop into both eyes daily as needed (dry eyes)., Disp: , Rfl:  .  rosuvastatin (CRESTOR) 10 MG tablet, Take 1 tablet (10 mg total) by mouth daily. (Patient taking differently: Take 10 mg by mouth every 7 (seven) days. Wednesday of each week), Disp: 90 tablet, Rfl: 1 .  Semaglutide, [name], (OZEMPIC, [name],) 2 MG/1.5ML SOPN, Inject 1 mg into the skin once a week. (Patient taking differently: Inject 1 mg into the skin every Wednesday.), Disp: 6 pen, Rfl: 1 .  tamsulosin (FLOMAX) 0.4 MG CAPS capsule, Take 0.4 mg by mouth daily., Disp: , Rfl:    Allergies  Allergen Reactions  . Vancomycin Rash     Review of Systems  Constitutional: Negative.  Negative for chills, fatigue and fever.  HENT: Negative for congestion and ear pain.   Respiratory: Negative.  Negative for chest tightness, shortness of breath and wheezing.   Cardiovascular: Negative.  Negative for chest pain and palpitations.  Gastrointestinal: Negative.   Neurological: Negative.  Negative for dizziness, weakness, numbness and headaches.     Today's Vitals   09/28/20 1038  BP: 138/70  Pulse: 78  Temp: 98.1 F (36.7 C)  TempSrc: Oral  PainSc:  0-No pain   There is no height or weight on file to calculate BMI.   Objective:  Physical Exam Constitutional:      Appearance: Normal appearance. He is obese.  HENT:     Head: Normocephalic and atraumatic.  Cardiovascular:     Rate and Rhythm: Normal rate and regular rhythm.     Pulses: Normal pulses.     Heart sounds: Normal heart sounds. No murmur heard.   Pulmonary:     Effort: Pulmonary effort is normal. No respiratory distress.     Breath sounds: Normal breath sounds. No wheezing.  Musculoskeletal:        General: Swelling present.  Comments: Unable to assess patient's left feet/toe due to the dressing. Dressing will be assessed and changed by podiatry tom as directed.      Right Lower Extremity: Right leg is amputated below knee.  Skin:    General: Skin is warm and dry.  Neurological:     Mental Status: He is alert and oriented to person, place, and time.  Psychiatric:        Mood and Affect: Mood normal.        Behavior: Behavior normal.         Assessment And Plan:     1. 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[place]) -He was seen in the hospital for amputation of his left great toe on 09/14/20 -On Dc he ws sent home with Augmentin and doxycycline -Pain is managed by hydrocodone  -Dr. Sherryle Lis will follow up tom. 09/29/2020, and change dressing/manage antx  - CMP14+EGFR - CBC no Diff  2. Type 2 diabetes mellitus with other circulatory complication, with long-term current use of insulin ([place]) -Patient taking his BS at home, stable  -Patient resumed taking ozempic  -Educated patient about the importance of managing BS and taking meds along with diabetic diet and exercise.   3. PAD (peripheral artery disease) ([place]) -Had angioplasty on 08/11/20 by Dr. Trula Slade.  -Continue follow up   4. Essential hypertension  -Chronic, stable.  -Continue meds.    Patient was given opportunity to ask questions. Patient verbalized understanding of the plan and was able to repeat key  elements of the plan. All questions were answered to their satisfaction.  Bary Castilla, DNP   I, Bary Castilla, DNP  have reviewed all documentation for this visit. The documentation on 09/28/20 or the exam, diagnosis, procedures, and orders are all accurate and complete.   IF YOU HAVE BEEN REFERRED TO A SPECIALIST, IT MAY TAKE 1-2 WEEKS TO SCHEDULE/PROCESS THE REFERRAL. IF YOU HAVE NOT HEARD FROM US/SPECIALIST IN TWO WEEKS, PLEASE GIVE [place] A CALL AT [phone].   THE PATIENT IS ENCOURAGED TO PRACTICE SOCIAL DISTANCING DUE TO THE COVID-19 PANDEMIC.

## 2020-09-29 ENCOUNTER — Ambulatory Visit (INDEPENDENT_AMBULATORY_CARE_PROVIDER_SITE_OTHER): Payer: Medicare Other | Admitting: Podiatry

## 2020-09-29 ENCOUNTER — Telehealth: Payer: Self-pay

## 2020-09-29 DIAGNOSIS — I110 Hypertensive heart disease with heart failure: Secondary | ICD-10-CM | POA: Diagnosis not present

## 2020-09-29 DIAGNOSIS — E1142 Type 2 diabetes mellitus with diabetic polyneuropathy: Secondary | ICD-10-CM | POA: Diagnosis not present

## 2020-09-29 DIAGNOSIS — E1151 Type 2 diabetes mellitus with diabetic peripheral angiopathy without gangrene: Secondary | ICD-10-CM | POA: Diagnosis not present

## 2020-09-29 DIAGNOSIS — Z6836 Body mass index (BMI) 36.0-36.9, adult: Secondary | ICD-10-CM | POA: Diagnosis not present

## 2020-09-29 DIAGNOSIS — Z79899 Other long term (current) drug therapy: Secondary | ICD-10-CM | POA: Diagnosis not present

## 2020-09-29 DIAGNOSIS — Z9181 History of falling: Secondary | ICD-10-CM | POA: Diagnosis not present

## 2020-09-29 DIAGNOSIS — I5042 Chronic combined systolic (congestive) and diastolic (congestive) heart failure: Secondary | ICD-10-CM

## 2020-09-29 DIAGNOSIS — Z4801 Encounter for change or removal of surgical wound dressing: Secondary | ICD-10-CM | POA: Diagnosis not present

## 2020-09-29 DIAGNOSIS — E1169 Type 2 diabetes mellitus with other specified complication: Secondary | ICD-10-CM | POA: Diagnosis not present

## 2020-09-29 DIAGNOSIS — I739 Peripheral vascular disease, unspecified: Secondary | ICD-10-CM | POA: Diagnosis not present

## 2020-09-29 DIAGNOSIS — E1122 Type 2 diabetes mellitus with diabetic chronic kidney disease: Secondary | ICD-10-CM

## 2020-09-29 DIAGNOSIS — E669 Obesity, unspecified: Secondary | ICD-10-CM | POA: Diagnosis not present

## 2020-09-29 DIAGNOSIS — E113553 Type 2 diabetes mellitus with stable proliferative diabetic retinopathy, bilateral: Secondary | ICD-10-CM | POA: Diagnosis not present

## 2020-09-29 DIAGNOSIS — Z4781 Encounter for orthopedic aftercare following surgical amputation: Secondary | ICD-10-CM | POA: Diagnosis not present

## 2020-09-29 DIAGNOSIS — R531 Weakness: Secondary | ICD-10-CM

## 2020-09-29 DIAGNOSIS — I959 Hypotension, unspecified: Secondary | ICD-10-CM | POA: Diagnosis not present

## 2020-09-29 DIAGNOSIS — N184 Chronic kidney disease, stage 4 (severe): Secondary | ICD-10-CM

## 2020-09-29 DIAGNOSIS — Z89412 Acquired absence of left great toe: Secondary | ICD-10-CM | POA: Diagnosis not present

## 2020-09-29 DIAGNOSIS — I69398 Other sequelae of cerebral infarction: Secondary | ICD-10-CM | POA: Diagnosis not present

## 2020-09-29 DIAGNOSIS — M869 Osteomyelitis, unspecified: Secondary | ICD-10-CM | POA: Diagnosis not present

## 2020-09-29 DIAGNOSIS — I872 Venous insufficiency (chronic) (peripheral): Secondary | ICD-10-CM | POA: Diagnosis not present

## 2020-09-29 LAB — CBC
Hematocrit: 29.5 % — ABNORMAL LOW (ref 37.5–51.0)
Hemoglobin: 9.9 g/dL — ABNORMAL LOW (ref 13.0–17.7)
MCH: 29.2 pg (ref 26.6–33.0)
MCHC: 33.6 g/dL (ref 31.5–35.7)
MCV: 87 fL (ref 79–97)
Platelets: 288 10*3/uL (ref 150–450)
RBC: 3.39 x10E6/uL — ABNORMAL LOW (ref 4.14–5.80)
RDW: 14.5 % (ref 11.6–15.4)
WBC: 7.4 10*3/uL (ref 3.4–10.8)

## 2020-09-29 LAB — CMP14+EGFR
ALT: 27 IU/L (ref 0–44)
AST: 22 IU/L (ref 0–40)
Albumin/Globulin Ratio: 1.1 — ABNORMAL LOW (ref 1.2–2.2)
Albumin: 3.9 g/dL (ref 3.6–4.6)
Alkaline Phosphatase: 92 IU/L (ref 44–121)
BUN/Creatinine Ratio: 29 — ABNORMAL HIGH (ref 10–24)
BUN: 58 mg/dL — ABNORMAL HIGH (ref 8–27)
Bilirubin Total: 0.3 mg/dL (ref 0.0–1.2)
CO2: 25 mmol/L (ref 20–29)
Calcium: 10.6 mg/dL — ABNORMAL HIGH (ref 8.6–10.2)
Chloride: 100 mmol/L (ref 96–106)
Creatinine, Ser: 2.01 mg/dL — ABNORMAL HIGH (ref 0.76–1.27)
Globulin, Total: 3.7 g/dL (ref 1.5–4.5)
Glucose: 105 mg/dL — ABNORMAL HIGH (ref 65–99)
Potassium: 4.6 mmol/L (ref 3.5–5.2)
Sodium: 139 mmol/L (ref 134–144)
Total Protein: 7.6 g/dL (ref 6.0–8.5)
eGFR: 33 mL/min/{1.73_m2} — ABNORMAL LOW (ref >59)

## 2020-09-29 NOTE — Chronic Care Management (AMB) (Signed)
09/29/2020- Called patient to remind of appointment with [name], CPP on 09/30/2020 at 12:00 pm. Patient aware but is not available at this time, he has Physical Therapy coming out to his home at that same time. We rescheduled appointment to 10/07/2020 at 1:00 pm. Patient agrees with this appointment, requested appointment change with scheduler Laverda Sorenson and [name], CPP notified.  Patient also inquired about A1C results, informed patient that results are not in yet but office will call when they have been completed. Patient understand.  [name], [place] Pharmacist Assistant [phone]

## 2020-09-30 ENCOUNTER — Ambulatory Visit: Payer: Medicare Other

## 2020-09-30 ENCOUNTER — Telehealth: Payer: Self-pay

## 2020-09-30 DIAGNOSIS — Z794 Long term (current) use of insulin: Secondary | ICD-10-CM | POA: Diagnosis not present

## 2020-09-30 DIAGNOSIS — I739 Peripheral vascular disease, unspecified: Secondary | ICD-10-CM

## 2020-09-30 DIAGNOSIS — I10 Essential (primary) hypertension: Secondary | ICD-10-CM

## 2020-09-30 DIAGNOSIS — E1159 Type 2 diabetes mellitus with other circulatory complications: Secondary | ICD-10-CM

## 2020-09-30 DIAGNOSIS — E782 Mixed hyperlipidemia: Secondary | ICD-10-CM

## 2020-09-30 NOTE — Patient Instructions (Signed)
Social Worker Visit Information  Goals we discussed today:  Goals Addressed            This Visit's Progress   . Mobility and Independence Optimized   On track    Timeframe:  Long-Range Goal Priority:  High Start Date:  3.14.22                          Expected End Date: 7.13.22                       Next planned outreach: 4.27.22  Patient Goals/Self-Care Activities . Over the next 30 days, patient will: With the help of his children  - Patient will self administer medications as prescribed Patient will attend all scheduled provider appointments Patient will call provider office for new concerns or questions Participate in home health upon discharge Contact SW as needed prior to next scheduled call Contact [place] [facility] to assess eligibility to obtain a ramp        Materials Provided: Verbal education about [place] [facility] provided by phone  Follow Up Plan: SW will follow up with patient by phone over the next 10 days   [name], [name], [name], Certified Dementia Practitioner [place] [facility] [phone]

## 2020-09-30 NOTE — Chronic Care Management (AMB) (Signed)
Chronic Care Management    Social Work Note  09/30/2020 Name: Micheal Walls MRN: [id number] DOB: 1936/03/24  Micheal Walls is a 85 y.o. year old male who is a primary care patient of [name], [place]. The CCM team was consulted to assist the patient with chronic disease management and/or care coordination needs related to: [employer] .   Engaged with patient daughter by phone for follow up visit in response to provider referral for social work chronic care management and care coordination services.   Consent to Services:  The patient was given information about Chronic Care Management services, agreed to services, and gave verbal consent prior to initiation of services.  Please see initial visit note for detailed documentation.   Patient agreed to services and consent obtained.   Assessment: Review of patient past medical history, allergies, medications, and health status, including review of relevant consultants reports was performed today as part of a comprehensive evaluation and provision of chronic care management and care coordination services.     SDOH (Social Determinants of Health) assessments and interventions performed:    Advanced Directives Status: Not addressed in this encounter.  CCM Care Plan  Allergies  Allergen Reactions  . Vancomycin Rash    Outpatient Encounter Medications as of 09/30/2020  Medication Sig Note  . allopurinol (ZYLOPRIM) 100 MG tablet Take 100 mg by mouth daily.   Marland Kitchen amoxicillin-clavulanate (AUGMENTIN) 875-125 MG tablet Take 1 tablet by mouth every 12 (twelve) hours.   Marland Kitchen aspirin EC 81 MG tablet Take 81 mg by mouth daily. Swallow whole.   . Biotin 5000 MCG CAPS Take 5,000 mcg by mouth daily.   [name] Apply between toes as needed for maceration.   . clopidogrel (PLAVIX) 75 MG tablet Take 75 mg by mouth daily.   . colchicine 0.6 MG tablet Take 0.6 mg by mouth daily as needed (gout flare up).   .  Continuous Blood Gluc Sensor (FREESTYLE LIBRE 14 DAY SENSOR) MISC Use as directed to check blood sugars   . diltiazem (CARDIZEM CD) 300 MG 24 hr capsule Take 300 mg by mouth daily.   . dorzolamide (TRUSOPT) 2 % ophthalmic solution Place 1 drop into both eyes at bedtime.   Marland Kitchen doxycycline (VIBRA-TABS) 100 MG tablet Take 1 tablet (100 mg total) by mouth every 12 (twelve) hours.   . ferrous sulfate 325 (65 FE) MG tablet Take 325 mg by mouth every Monday, Wednesday, and Friday.   . fish oil-omega-3 fatty acids 1000 MG capsule Take 2 g by mouth daily.   . furosemide (LASIX) 40 MG tablet Take 2 tabs in morning and 1 tab evening (Patient taking differently: Take 40-80 mg by mouth 2 (two) times daily. 80 mg in the morning and 40 mg at night) 08/09/2020: 80 mg in the morning & 40 at bedtime   . gabapentin (NEURONTIN) 100 MG capsule Take 2 capsules in am 1 capsule mid day and 1 capsule in evening   . glucose blood (FREESTYLE PRECISION NEO TEST) test strip Use as instructed   . HYDROcodone-acetaminophen (NORCO/VICODIN) 5-325 MG tablet Take 1 tablet by mouth every 6 (six) hours as needed for moderate pain.   Marland Kitchen latanoprost (XALATAN) 0.005 % ophthalmic solution Place 1 drop into both eyes at bedtime.    . Magnesium 250 MG TABS Take 1 tablet by mouth daily.   . Multiple Vitamin (MULTIVITAMIN WITH MINERALS) TABS tablet Take 1 tablet by mouth daily.   . Multiple  Vitamins-Minerals (PRESERVISION AREDS PO) Take 1 tablet by mouth daily.   . pantoprazole (PROTONIX) 40 MG tablet Take 1 tablet  by mouth daily. (Patient taking differently: Take 40 mg by mouth at bedtime.)   . Polyvinyl Alcohol-Povidone (REFRESH OP) Place 1 drop into both eyes daily as needed (dry eyes).   . rosuvastatin (CRESTOR) 10 MG tablet Take 1 tablet (10 mg total) by mouth daily. (Patient taking differently: Take 10 mg by mouth every 7 (seven) days. Wednesday of each week)   . Semaglutide, [name], (OZEMPIC, [name],) 2 MG/1.5ML SOPN Inject 1 mg into  the skin once a week. (Patient taking differently: Inject 1 mg into the skin every Wednesday.)   . tamsulosin (FLOMAX) 0.4 MG CAPS capsule Take 0.4 mg by mouth daily.    No facility-administered encounter medications on file as of 09/30/2020.    Patient Active Problem List   Diagnosis Date Noted  . 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[place])   . Left ankle swelling   . Diabetic foot infection ([place])   . Cerebral thrombosis with cerebral infarction 07/20/2020  . Thrombocytopenia ([place]) 07/19/2020  . Cerebral ischemia 07/18/2020  . Bilateral hearing loss 05/20/2020  . Does use hearing aid 05/20/2020  . Diabetic ulcer of left great toe ([place]) 01/16/2020  . Controlled type 2 diabetes mellitus with stable proliferative retinopathy of both eyes, with long-term current use of insulin ([place]) 01/06/2020  . Intermediate stage nonexudative age-related macular degeneration of both eyes 01/06/2020  . Left epiretinal membrane 01/06/2020  . Drusen of right macula 01/06/2020  . AKI (acute kidney injury) ([place]) 11/02/2019  . Cellulitis 11/02/2019  . Elevated TSH 07/10/2018  . Nephropathy 02/28/2018  . Disturbance of skin sensation 02/28/2018  . PAD (peripheral artery disease) ([place]) 07/17/2017  . 110.1 10/21/2013  . Epiphora 04/24/2013  . Laxity of eyelid 04/24/2013  . Anemia 04/09/2013  . History of peripheral vascular disease 04/09/2013  . History of stroke 04/09/2013  . S/P unilateral BKA (below knee amputation) ([place]) 03/17/2013  . Diabetes mellitus type 2 in obese ([place]) 03/17/2013  . Mixed hyperlipidemia 03/17/2013  . Essential hypertension 03/17/2013  . Gout 03/17/2013  . CKD (chronic kidney disease), stage III ([place]) 03/17/2013  . OSA on CPAP 03/17/2013  . Anemia, iron deficiency 03/17/2013  . Diverticulosis of colon 03/17/2013  . RBBB 03/17/2013  . Punctal stenosis, acquired 10/02/2012    Conditions to be addressed/monitored: DMII and PAD; Limited access to caregiver  Care Plan : Social Work [facility]  Plan of Care  Updates made by [name] since 09/30/2020 12:00 AM    Problem: Mobility and Independence     Long-Range Goal: Mobility and Independence Optimized   Start Date: 08/24/2020  Expected End Date: 12/23/2020  Recent Progress: On track  Priority: High  Note:   Current Barriers:  . Chronic disease management support and education needs related to DM and PAD   . Limited access to caregiver . Unable to independently prepare meals  Social Worker Clinical Goal(s):  Marland Kitchen Over the next 120 days, patient will work with SW to identify and address any acute and/or chronic care coordination needs related to the self health management of DM and PAD   . Over the next 30 days the patient and his daughter will work with SW to identify caregiver resources for in the home . Over the next 10 days the patient will be placed on the wait list for mobile meals Goal Met  CCM SW Interventions: Completed with the patients  daughter [name] . Inter-disciplinary care team collaboration (see longitudinal plan of care) . Collaboration with [name], [name] regarding development and update of comprehensive plan of care as evidenced by provider attestation and co-signature . Successful outbound call placed to the patients daughter [name] in response to a message received requesting resources for a ramp . Discussed the patient currently has a metal ramp that is not well secured to the home and seems to wobble as the patient uses it . Provided verbal education on resource: [facility]) . Advised Mrs. Mills to contact [place] [facility] to complete at intake to determine if the patient is eligible to have a ramp built . Discussed the patient has received the 30 days transitional meals and is enjoying them . SW will follow up over the next 10 days to assess outcome of referral to [facility] as well as to assess if the patient will need to extend meal program  Patient Goals/Self-Care  Activities . Over the next 30 days, patient will: With the help of his children  - Patient will self administer medications as prescribed Patient will attend all scheduled provider appointments Patient will call provider office for new concerns or questions Participate in home health upon hospital discharge Contact SW as needed prior to next scheduled call Contact  B[facility] to assess eligibility to obtain a ramp  Follow Up Plan:  SW will follow up with the patient over the next month       Follow Up Plan: SW will follow up with patient by phone over the next 10 days      [place], [name], Certified Dementia Practitioner [place] [facility] [place] [facility] [phone]  Total time spent performing care coordination and/or care management activities with the patient by phone or face to face = 16 minutes.

## 2020-10-01 DIAGNOSIS — M869 Osteomyelitis, unspecified: Secondary | ICD-10-CM | POA: Diagnosis not present

## 2020-10-01 DIAGNOSIS — Z4801 Encounter for change or removal of surgical wound dressing: Secondary | ICD-10-CM | POA: Diagnosis not present

## 2020-10-01 DIAGNOSIS — Z89412 Acquired absence of left great toe: Secondary | ICD-10-CM | POA: Diagnosis not present

## 2020-10-01 DIAGNOSIS — I69398 Other sequelae of cerebral infarction: Secondary | ICD-10-CM | POA: Diagnosis not present

## 2020-10-01 DIAGNOSIS — Z4781 Encounter for orthopedic aftercare following surgical amputation: Secondary | ICD-10-CM | POA: Diagnosis not present

## 2020-10-01 DIAGNOSIS — E1169 Type 2 diabetes mellitus with other specified complication: Secondary | ICD-10-CM | POA: Diagnosis not present

## 2020-10-01 NOTE — Chronic Care Management (AMB) (Signed)
Chronic Care Management   CCM RN Visit Note  09/17/2020 Name: Micheal Walls MRN: [id number] DOB: Jun 24, 1935  Subjective: Micheal Walls is a 85 y.o. year old male who is a primary care patient of [name], [place]. The care management team was consulted for assistance with disease management and care coordination needs.    Engaged with patient by telephone for follow up visit in response to provider referral for case management and/or care coordination services.   Consent to Services:  The patient was given information about Chronic Care Management services, agreed to services, and gave verbal consent prior to initiation of services.  Please see initial visit note for detailed documentation.   Patient agreed to services and verbal consent obtained.   Assessment: Review of patient past medical history, allergies, medications, health status, including review of consultants reports, laboratory and other test data, was performed as part of comprehensive evaluation and provision of chronic care management services.   SDOH (Social Determinants of Health) assessments and interventions performed:    CCM Care Plan  Allergies  Allergen Reactions  . Vancomycin Rash    Outpatient Encounter Medications as of 09/17/2020  Medication Sig Note  . allopurinol (ZYLOPRIM) 100 MG tablet Take 100 mg by mouth daily.   Marland Kitchen amoxicillin-clavulanate (AUGMENTIN) 875-125 MG tablet Take 1 tablet by mouth every 12 (twelve) hours.   Marland Kitchen aspirin EC 81 MG tablet Take 81 mg by mouth daily. Swallow whole.   . Biotin 5000 MCG CAPS Take 5,000 mcg by mouth daily.   [name] Apply between toes as needed for maceration.   . clopidogrel (PLAVIX) 75 MG tablet Take 75 mg by mouth daily.   . colchicine 0.6 MG tablet Take 0.6 mg by mouth daily as needed (gout flare up).   . Continuous Blood Gluc Sensor (FREESTYLE LIBRE 14 DAY SENSOR) MISC Use as directed to check blood sugars   . diltiazem  (CARDIZEM CD) 300 MG 24 hr capsule Take 300 mg by mouth daily.   . dorzolamide (TRUSOPT) 2 % ophthalmic solution Place 1 drop into both eyes at bedtime.   Marland Kitchen doxycycline (VIBRA-TABS) 100 MG tablet Take 1 tablet (100 mg total) by mouth every 12 (twelve) hours.   . ferrous sulfate 325 (65 FE) MG tablet Take 325 mg by mouth every Monday, Wednesday, and Friday.   . fish oil-omega-3 fatty acids 1000 MG capsule Take 2 g by mouth daily.   . furosemide (LASIX) 40 MG tablet Take 2 tabs in morning and 1 tab evening (Patient taking differently: Take 40-80 mg by mouth 2 (two) times daily. 80 mg in the morning and 40 mg at night) 08/09/2020: 80 mg in the morning & 40 at bedtime   . gabapentin (NEURONTIN) 100 MG capsule Take 2 capsules in am 1 capsule mid day and 1 capsule in evening   . glucose blood (FREESTYLE PRECISION NEO TEST) test strip Use as instructed   . HYDROcodone-acetaminophen (NORCO/VICODIN) 5-325 MG tablet Take 1 tablet by mouth every 6 (six) hours as needed for moderate pain.   Marland Kitchen latanoprost (XALATAN) 0.005 % ophthalmic solution Place 1 drop into both eyes at bedtime.    . Magnesium 250 MG TABS Take 1 tablet by mouth daily.   . Multiple Vitamin (MULTIVITAMIN WITH MINERALS) TABS tablet Take 1 tablet by mouth daily.   . Multiple Vitamins-Minerals (PRESERVISION AREDS PO) Take 1 tablet by mouth daily.   . pantoprazole (PROTONIX) 40 MG tablet Take 1 tablet  by mouth daily. (Patient taking differently: Take 40 mg by mouth at bedtime.)   . Polyvinyl Alcohol-Povidone (REFRESH OP) Place 1 drop into both eyes daily as needed (dry eyes).   . rosuvastatin (CRESTOR) 10 MG tablet Take 1 tablet (10 mg total) by mouth daily. (Patient taking differently: Take 10 mg by mouth every 7 (seven) days. Wednesday of each week)   . Semaglutide, [name], (OZEMPIC, [name],) 2 MG/1.5ML SOPN Inject 1 mg into the skin once a week. (Patient taking differently: Inject 1 mg into the skin every Wednesday.)   . tamsulosin  (FLOMAX) 0.4 MG CAPS capsule Take 0.4 mg by mouth daily.    No facility-administered encounter medications on file as of 09/17/2020.    Patient Active Problem List   Diagnosis Date Noted  . 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[place])   . Left ankle swelling   . Diabetic foot infection ([place])   . Cerebral thrombosis with cerebral infarction 07/20/2020  . Thrombocytopenia ([place]) 07/19/2020  . Cerebral ischemia 07/18/2020  . Bilateral hearing loss 05/20/2020  . Does use hearing aid 05/20/2020  . Diabetic ulcer of left great toe ([place]) 01/16/2020  . Controlled type 2 diabetes mellitus with stable proliferative retinopathy of both eyes, with long-term current use of insulin ([place]) 01/06/2020  . Intermediate stage nonexudative age-related macular degeneration of both eyes 01/06/2020  . Left epiretinal membrane 01/06/2020  . Drusen of right macula 01/06/2020  . AKI (acute kidney injury) ([place]) 11/02/2019  . Cellulitis 11/02/2019  . Elevated TSH 07/10/2018  . Nephropathy 02/28/2018  . Disturbance of skin sensation 02/28/2018  . PAD (peripheral artery disease) ([place]) 07/17/2017  . 110.1 10/21/2013  . Epiphora 04/24/2013  . Laxity of eyelid 04/24/2013  . Anemia 04/09/2013  . History of peripheral vascular disease 04/09/2013  . History of stroke 04/09/2013  . S/P unilateral BKA (below knee amputation) ([place]) 03/17/2013  . Diabetes mellitus type 2 in obese ([place]) 03/17/2013  . Mixed hyperlipidemia 03/17/2013  . Essential hypertension 03/17/2013  . Gout 03/17/2013  . CKD (chronic kidney disease), stage III ([place]) 03/17/2013  . OSA on CPAP 03/17/2013  . Anemia, iron deficiency 03/17/2013  . Diverticulosis of colon 03/17/2013  . RBBB 03/17/2013  . Punctal stenosis, acquired 10/02/2012    Conditions to be addressed/monitored:PAD, Cellulitis of left lower extremity, Neuropathy, Mixed hyperlipidemia, Type 2 diabetes mellitus   Care Plan : Assist with Chronic Care Management and Care Coordination needs   Updates made by Lynne Logan, RN since 10/01/2020 12:00 AM  Completed 10/01/2020  Problem: Assist with Chronic Care Management and Care Coordination needs Resolved 09/17/2020  Priority: High    Goal: Assist with Chronic Care Management and Care Coordination needs Completed 09/17/2020  Start Date: 08/24/2020  Expected End Date: 10/25/2020  Recent Progress: On track  Priority: High  Note:   Current Barriers:  Marland Kitchen Knowledge Barriers related to resources and support available to address needs related to PAD, Cellulitis of left lower extremity, Neuropathy, Mixed hyperlipidemia, Type 2 diabetes mellitus  Case Manager Clinical Goal(s):  Marland Kitchen Over the next 60 days, patient will work with [name] to address needs related to disease education and support for chronic conditions including: PAD, Cellulitis of left lower extremity, Neuropathy, Mixed hyperlipidemia, Type 2 diabetes mellitus  Interventions:  . Collaborated with  embedded [name] [name]  to establish an individualized plan of care  Patient Self Care Activities:  . Patient will work with the CCM RN CM to address care coordination needs and will continue  to work with the clinical team to address health care and disease management related needs.    Next Follow Up Date: 08/26/20    Care Plan : Cellulitis of Left Great Toe  Updates made by Lynne Logan, RN since 10/01/2020 12:00 AM    Problem: Status post amputation of great toe, left   Priority: High    Long-Range Goal: Status post amputation of great toe, left   Start Date: 09/17/2020  Expected End Date: 03/19/2021  This Visit's Progress: On track  Priority: High  Note:   Current Barriers:   Ineffective Self Health Maintenance   Unable to perform ADLs independently  Unable to perform IADLs independently  Status post amputation of great toe, left Clinical Goal(s):  Marland Kitchen Collaboration with [name], [name] regarding development and update of comprehensive plan of care as evidenced by  provider attestation and co-signature . Inter-disciplinary care team collaboration (see longitudinal plan of care)  patient will work with care management team to address care coordination and chronic disease management needs related to Disease Management  Educational Needs  Care Coordination  Medication Management and Education  Psychosocial Support   Interventions:   Evaluation of current treatment plan related to  PAD/Status post amputation of great toe, left , self-management and patient's adherence to plan as established by provider.  Collaboration with [name], [name] regarding development and update of comprehensive plan of care as evidenced by provider attestation       and co-signature  Inter-disciplinary care team collaboration (see longitudinal plan of care)  Determined patient is s/p left great toe amputation and is home with daughter available to assist him as needed  Confirmed home health has contacted Mr. Shadowens and a home visit is scheduled  Determined patient is to leave his foot dressing intact until his s/p follow up surgical visit is completed  Confirmed patient has all medications on hand at home and verbalizes understanding his prescribed regimen  Reviewed s/s suggestive of infection and or when to contact the doctor is needed  Reviewed and discussed post op follow appointment scheduled on 09/29/20 $RemoveBe'@[id number]'[id number]$ :25 AM with Dr. Sherryle Lis . Reviewed medications with patient and discussed importance of medication adherence  Discussed plans with patient for ongoing care management follow up and provided patient with direct contact information for care management team 09/29/20 Per post op follow up completed with Dr. Sherryle Lis . Assessment: .    . 1. . Status post amputation of great toe, left ([place])  .  2. . Type II diabetes mellitus with peripheral circulatory disorder ([place])  .  3. . Diabetic peripheral neuropathy associated with type 2 diabetes mellitus ([place])  .  4.  . Peripheral arterial disease ([place])  .    Marland Kitchen Plan: .  Patient was evaluated and treated and all questions answered. . S/p foot surgery left . -Progressing as expected post-operatively.  Seems to be healing well hopefully perfusion is adequate . -WB Status: WBAT in postop shoe . -Sutures: We will leave intact for another 2 weeks.  I would like to see him back in 1 week for reevaluation . -Foot redressed.  They should change at home 3 times a week with Betadine Self Care Activities:  . Continue to keep all scheduled follow up appointments . Take medications as directed  . Let your healthcare team know if you are unable to take your medications . Call your pharmacy for refills at least 7 days prior to running out of medication . Keep you post  op follow up appointment with Dr. Sherryle Lis as scheduled for 09/29/20 $[id number]'@[id number]$ :15 AM . Call your surgeon if you develop s/s suggestive of infection or experiencing drainage, bleeding or increased pain to your surgical wound  Patient Goals: - to recover from my toe amputation  Follow Up Plan: Telephone follow up appointment with care management team member scheduled for: 10/16/20     Plan:Telephone follow up appointment with care management team member scheduled for:  10/16/20  Barb Merino, RN, BSN, CCM Care Management Coordinator [place] [facility]  Direct Phone: [phone]

## 2020-10-01 NOTE — Patient Instructions (Signed)
Goals Addressed    . COMPLETED: Assist with Chronic Care Management and Care Coordination needs       Timeframe:  Short-Term Goal Priority:  High Start Date: 08/24/20                           Expected End Date: 10/24/20      Follow Up Call Scheduled: 08/26/20        -Patient will work with the CCM RN CM to address care coordination needs and will continue to work with the clinical team to address health care and disease management related needs.             . Wound health s/p amputation to left great toe   On track    Timeframe:  Long-Range Goal Priority:  High Start Date:  09/17/20                           Expected End Date:  03/19/21  Next Scheduled Follow Up Date: 10/16/20  Self Care Activities:  . Continue to keep all scheduled follow up appointments . Take medications as directed  . Let your healthcare team know if you are unable to take your medications . Call your pharmacy for refills at least 7 days prior to running out of medication . Keep you post op follow up appointment with Dr. Sherryle Lis as scheduled for 09/29/20 $RemoveBef'@[id number]$ :15 AM . Call your surgeon if you develop s/s suggestive of infection or experiencing drainage, bleeding or increased pain to your surgical wound  Patient Goals: - to recover from my toe amputation

## 2020-10-01 NOTE — Progress Notes (Signed)
  Subjective:  Patient ID: GIAVONNI FONDER, male    DOB: April 08, 1936,  MRN: [id number]  Chief Complaint  Patient presents with  . Foot Ulcer    PT stated that he is doing okay    DOS: 09/14/2020 Procedure: Left hallux amputation  85 y.o. male returns for post-op check.  Doing well, here with his son dressing has not been changed.  Review of Systems: Negative except as noted in the HPI. Denies N/V/F/Ch.   Objective:  There were no vitals filed for this visit. There is no height or weight on file to calculate BMI. Constitutional Well developed. Well nourished.  Vascular Foot warm and well perfused. Capillary refill normal to all digits.   Neurologic Normal speech. Oriented to person, place, and time. Epicritic sensation to light touch grossly present bilaterally.  Dermatologic  minor maceration of the incision, no dehiscence or signs of infection, seems to be healing relatively well  Orthopedic:  Minimal tenderness to palpation noted about the surgical site.   Assessment:   1. Status post amputation of great toe, left ([place])   2. Type II diabetes mellitus with peripheral circulatory disorder (HCC)   3. Diabetic peripheral neuropathy associated with type 2 diabetes mellitus ([place])   4. Peripheral arterial disease ([place])    Plan:  Patient was evaluated and treated and all questions answered.  S/p foot surgery left -Progressing as expected post-operatively.  Seems to be healing well hopefully perfusion is adequate -WB Status: WBAT in postop shoe -Sutures: We will leave intact for another 2 weeks.  I would like to see him back in 1 week for reevaluation -Foot redressed.  They should change at home 3 times a week with Betadine  No follow-ups on file.

## 2020-10-02 DIAGNOSIS — Z4781 Encounter for orthopedic aftercare following surgical amputation: Secondary | ICD-10-CM | POA: Diagnosis not present

## 2020-10-02 DIAGNOSIS — Z4801 Encounter for change or removal of surgical wound dressing: Secondary | ICD-10-CM | POA: Diagnosis not present

## 2020-10-02 DIAGNOSIS — M869 Osteomyelitis, unspecified: Secondary | ICD-10-CM | POA: Diagnosis not present

## 2020-10-02 DIAGNOSIS — I69398 Other sequelae of cerebral infarction: Secondary | ICD-10-CM | POA: Diagnosis not present

## 2020-10-02 DIAGNOSIS — Z89412 Acquired absence of left great toe: Secondary | ICD-10-CM | POA: Diagnosis not present

## 2020-10-02 DIAGNOSIS — E1169 Type 2 diabetes mellitus with other specified complication: Secondary | ICD-10-CM | POA: Diagnosis not present

## 2020-10-05 ENCOUNTER — Ambulatory Visit (INDEPENDENT_AMBULATORY_CARE_PROVIDER_SITE_OTHER): Payer: Medicare Other | Admitting: Podiatry

## 2020-10-05 ENCOUNTER — Other Ambulatory Visit: Payer: Self-pay

## 2020-10-05 ENCOUNTER — Encounter: Payer: Self-pay | Admitting: Podiatry

## 2020-10-05 DIAGNOSIS — Z89412 Acquired absence of left great toe: Secondary | ICD-10-CM | POA: Diagnosis not present

## 2020-10-05 DIAGNOSIS — L97522 Non-pressure chronic ulcer of other part of left foot with fat layer exposed: Secondary | ICD-10-CM

## 2020-10-05 DIAGNOSIS — E1169 Type 2 diabetes mellitus with other specified complication: Secondary | ICD-10-CM | POA: Diagnosis not present

## 2020-10-05 DIAGNOSIS — Z4801 Encounter for change or removal of surgical wound dressing: Secondary | ICD-10-CM | POA: Diagnosis not present

## 2020-10-05 DIAGNOSIS — I739 Peripheral vascular disease, unspecified: Secondary | ICD-10-CM

## 2020-10-05 DIAGNOSIS — M869 Osteomyelitis, unspecified: Secondary | ICD-10-CM | POA: Diagnosis not present

## 2020-10-05 DIAGNOSIS — Z4781 Encounter for orthopedic aftercare following surgical amputation: Secondary | ICD-10-CM | POA: Diagnosis not present

## 2020-10-05 DIAGNOSIS — E1142 Type 2 diabetes mellitus with diabetic polyneuropathy: Secondary | ICD-10-CM

## 2020-10-05 DIAGNOSIS — I69398 Other sequelae of cerebral infarction: Secondary | ICD-10-CM | POA: Diagnosis not present

## 2020-10-05 MED ORDER — DOXYCYCLINE HYCLATE 100 MG PO TABS: 100.0000 mg | ORAL_TABLET | Freq: Two times a day (BID) | ORAL | 0 refills | Status: DC

## 2020-10-05 MED ORDER — AMOXICILLIN-POT CLAVULANATE 875-125 MG PO TABS: 1.0000 | ORAL_TABLET | Freq: Two times a day (BID) | ORAL | 0 refills | Status: DC

## 2020-10-05 NOTE — Progress Notes (Signed)
  Subjective:  Patient ID: Micheal Walls, male    DOB: 1935/07/04,  MRN: [id number]  No chief complaint on file.   DOS: 09/14/2020 Procedure: Left hallux amputation  85 y.o. male returns for post-op check.  They have been changing the dressing 3 times weekly at home  Review of Systems: Negative except as noted in the HPI. Denies N/V/F/Ch.   Objective:  There were no vitals filed for this visit. There is no height or weight on file to calculate BMI. Constitutional Well developed. Well nourished.  Vascular Foot warm and well perfused. Capillary refill normal to all digits.   Neurologic Normal speech. Oriented to person, place, and time. Epicritic sensation to light touch grossly present bilaterally.  Dermatologic  central dehiscence of the wound, medial margin has healed, now open ulceration measuring 3 cm x 2 cm  Orthopedic:  Minimal tenderness to palpation noted about the surgical site.      Assessment:   No diagnosis found. Plan:  Patient was evaluated and treated and all questions answered.  Ulcer left foot -Unfortunately his wound appears to have dehisced and has not completely healed.  Hopefully will heal by secondary intention at this point.  No signs of infection.  No exposed bone or tendon or joint. -Debridement as below. -Dressed with Iodosorb, DSD. -Continue off-loading with surgical shoe. -Continue 3 times weekly changes go to daily if necessary for drainage -Iodosorb at home -Refill antibiotics for now  Procedure: Excisional Debridement of Wound Rationale: Removal of non-viable soft tissue from the wound to promote healing.  Anesthesia: none  Post-Debridement Wound Measurements: 3 cm x 2 cm x 0.2 cm Type of Debridement: Sharp Excisional Tissue Removed: Non-viable soft tissue Depth of Debridement: subcutaneous tissue. Technique: Sharp excisional debridement to bleeding, viable wound base.  Dressing: Dry, sterile, compression dressing. Disposition:  Patient tolerated procedure well.      Return in about 2 weeks (around 10/19/2020) for wound care.

## 2020-10-05 NOTE — Patient Instructions (Signed)
Continue 3x weekly dressing changes with the iodosorb ointment, then calcium alginate layer and gauze dressings. Change daily if drainage continues to saturate through the dressing

## 2020-10-06 ENCOUNTER — Telehealth: Payer: Self-pay | Admitting: *Deleted

## 2020-10-06 ENCOUNTER — Telehealth: Payer: Self-pay

## 2020-10-06 DIAGNOSIS — L97522 Non-pressure chronic ulcer of other part of left foot with fat layer exposed: Secondary | ICD-10-CM

## 2020-10-06 NOTE — Chronic Care Management (AMB) (Signed)
Chronic Care Management Pharmacy Assistant   Name: Micheal Walls  MRN: [id number] DOB: 31-Aug-1935   Reason for Encounter: Appointment Reminder   Recent office visits:  09/28/20- Micheal Walls(PCP)  Recent consult visits:  10/05/20-Micheal Walls, DPM(Podiatry) 09/29/20-Micheal Walls, DPM(Podiatry)  Hospital visits:  Medication Reconciliation was completed by comparing discharge summary, patient's EMR and Pharmacy list, and upon discussion with patient.  Admitted to the hospital on 09/12/20 due to Left Hallux Osteomyelitis. Discharge date was 09/16/20. Discharged from [facility].    New?Medications Started at [facility] Discharge:?? -started none noted  Medication Changes at Hospital Discharge: -Changed none noted  Medications Discontinued at Hospital Discharge: -Stopped none noted  Medications that remain the same after Hospital Discharge:??  -All other medications will remain the same.    Medications: Outpatient Encounter Medications as of 10/06/2020  Medication Sig Note  . allopurinol (ZYLOPRIM) 100 MG tablet Take 100 mg by mouth daily.   Marland Kitchen amoxicillin-clavulanate (AUGMENTIN) 875-125 MG tablet Take 1 tablet by mouth every 12 (twelve) hours.   Marland Kitchen aspirin EC 81 MG tablet Take 81 mg by mouth daily. Swallow whole.   . Biotin 5000 MCG CAPS Take 5,000 mcg by mouth daily.   Robyne Askew Paint Modified 1.5 % LIQD Apply between toes as needed for maceration.   . clopidogrel (PLAVIX) 75 MG tablet Take 75 mg by mouth daily.   . colchicine 0.6 MG tablet Take 0.6 mg by mouth daily as needed (gout flare up).   . Continuous Blood Gluc Sensor (FREESTYLE LIBRE 14 DAY SENSOR) MISC Use as directed to check blood sugars   . diltiazem (CARDIZEM CD) 300 MG 24 hr capsule Take 300 mg by mouth daily.   . dorzolamide (TRUSOPT) 2 % ophthalmic solution Place 1 drop into both eyes at bedtime.   Marland Kitchen doxycycline (VIBRA-TABS) 100 MG tablet Take 1 tablet (100 mg total) by mouth every 12  (twelve) hours.   . ferrous sulfate 325 (65 FE) MG tablet Take 325 mg by mouth every Monday, Wednesday, and Friday.   . fish oil-omega-3 fatty acids 1000 MG capsule Take 2 g by mouth daily.   . furosemide (LASIX) 40 MG tablet Take 2 tabs in morning and 1 tab evening (Patient taking differently: Take 40-80 mg by mouth 2 (two) times daily. 80 mg in the morning and 40 mg at night) 08/09/2020: 80 mg in the morning & 40 at bedtime   . gabapentin (NEURONTIN) 100 MG capsule Take 2 capsules in am 1 capsule mid day and 1 capsule in evening   . glucose blood (FREESTYLE PRECISION NEO TEST) test strip Use as instructed   . HYDROcodone-acetaminophen (NORCO/VICODIN) 5-325 MG tablet Take 1 tablet by mouth every 6 (six) hours as needed for moderate pain.   Marland Kitchen latanoprost (XALATAN) 0.005 % ophthalmic solution Place 1 drop into both eyes at bedtime.    . Magnesium 250 MG TABS Take 1 tablet by mouth daily.   . Multiple Vitamin (MULTIVITAMIN WITH MINERALS) TABS tablet Take 1 tablet by mouth daily.   . Multiple Vitamins-Minerals (PRESERVISION AREDS PO) Take 1 tablet by mouth daily.   . pantoprazole (PROTONIX) 40 MG tablet Take 1 tablet  by mouth daily. (Patient taking differently: Take 40 mg by mouth at bedtime.)   . Polyvinyl Alcohol-Povidone (REFRESH OP) Place 1 drop into both eyes daily as needed (dry eyes).   . rosuvastatin (CRESTOR) 10 MG tablet Take 1 tablet (10 mg total) by mouth daily. (Patient taking differently: Take 10 mg  by mouth every 7 (seven) days. Wednesday of each week)   . Semaglutide, [name], (OZEMPIC, [name],) 2 MG/1.5ML SOPN Inject 1 mg into the skin once a week. (Patient taking differently: Inject 1 mg into the skin every Wednesday.)   . tamsulosin (FLOMAX) 0.4 MG CAPS capsule Take 0.4 mg by mouth daily.    No facility-administered encounter medications on file as of 10/06/2020.    Called and spoke with the patient reminding her of upcoming CCM Call appointment on 10/07/20 at 1:00PM with  [name], CPP. The patient was made aware to have medications and supplements nearby during phone call with CPP, [name]. Patient verbalized understanding.   [name] Clinical Pharmacist Assistant [phone]

## 2020-10-06 NOTE — Telephone Encounter (Signed)
Claiborne Billings w/ [facility] is calling because patient is having a lot of clotted blood(amputation site),not actively bleeding at time, is requesting new in home wound care orders for the patient. Please advise.

## 2020-10-07 ENCOUNTER — Ambulatory Visit: Payer: Medicare Other

## 2020-10-07 ENCOUNTER — Telehealth: Payer: Self-pay

## 2020-10-07 DIAGNOSIS — I69398 Other sequelae of cerebral infarction: Secondary | ICD-10-CM | POA: Diagnosis not present

## 2020-10-07 DIAGNOSIS — E1159 Type 2 diabetes mellitus with other circulatory complications: Secondary | ICD-10-CM

## 2020-10-07 DIAGNOSIS — E782 Mixed hyperlipidemia: Secondary | ICD-10-CM

## 2020-10-07 DIAGNOSIS — I10 Essential (primary) hypertension: Secondary | ICD-10-CM

## 2020-10-07 DIAGNOSIS — I739 Peripheral vascular disease, unspecified: Secondary | ICD-10-CM

## 2020-10-07 DIAGNOSIS — Z794 Long term (current) use of insulin: Secondary | ICD-10-CM

## 2020-10-07 DIAGNOSIS — Z89412 Acquired absence of left great toe: Secondary | ICD-10-CM | POA: Diagnosis not present

## 2020-10-07 DIAGNOSIS — Z4801 Encounter for change or removal of surgical wound dressing: Secondary | ICD-10-CM | POA: Diagnosis not present

## 2020-10-07 DIAGNOSIS — E1169 Type 2 diabetes mellitus with other specified complication: Secondary | ICD-10-CM | POA: Diagnosis not present

## 2020-10-07 DIAGNOSIS — Z4781 Encounter for orthopedic aftercare following surgical amputation: Secondary | ICD-10-CM | POA: Diagnosis not present

## 2020-10-07 DIAGNOSIS — M869 Osteomyelitis, unspecified: Secondary | ICD-10-CM | POA: Diagnosis not present

## 2020-10-07 NOTE — Chronic Care Management (AMB) (Signed)
Chronic Care Management    Social Work Note  10/07/2020 Name: Micheal Walls MRN: [id number] DOB: June 27, 1935  Micheal Walls is a 85 y.o. year old male who is a primary care patient of [name], [place]. The CCM team was consulted to assist the patient with chronic disease management and/or care coordination needs related to: [employer] .   Engaged with patient daughter by phone for follow up visit in response to provider referral for social work chronic care management and care coordination services.   Consent to Services:  The patient was given information about Chronic Care Management services, agreed to services, and gave verbal consent prior to initiation of services.  Please see initial visit note for detailed documentation.   Patient agreed to services and consent obtained.   Assessment: Review of patient past medical history, allergies, medications, and health status, including review of relevant consultants reports was performed today as part of a comprehensive evaluation and provision of chronic care management and care coordination services.     SDOH (Social Determinants of Health) assessments and interventions performed:    Advanced Directives Status: Not addressed in this encounter.  CCM Care Plan  Allergies  Allergen Reactions  . Vancomycin Rash    Outpatient Encounter Medications as of 10/07/2020  Medication Sig Note  . allopurinol (ZYLOPRIM) 100 MG tablet Take 100 mg by mouth daily.   Marland Kitchen amoxicillin-clavulanate (AUGMENTIN) 875-125 MG tablet Take 1 tablet by mouth every 12 (twelve) hours.   Marland Kitchen aspirin EC 81 MG tablet Take 81 mg by mouth daily. Swallow whole.   . Biotin 5000 MCG CAPS Take 5,000 mcg by mouth daily.   [name] Apply between toes as needed for maceration.   . clopidogrel (PLAVIX) 75 MG tablet Take 75 mg by mouth daily.   . colchicine 0.6 MG tablet Take 0.6 mg by mouth daily as needed (gout flare up).   .  Continuous Blood Gluc Sensor (FREESTYLE LIBRE 14 DAY SENSOR) MISC Use as directed to check blood sugars   . diltiazem (CARDIZEM CD) 300 MG 24 hr capsule Take 300 mg by mouth daily.   . dorzolamide (TRUSOPT) 2 % ophthalmic solution Place 1 drop into both eyes at bedtime.   Marland Kitchen doxycycline (VIBRA-TABS) 100 MG tablet Take 1 tablet (100 mg total) by mouth every 12 (twelve) hours.   . ferrous sulfate 325 (65 FE) MG tablet Take 325 mg by mouth every Monday, Wednesday, and Friday.   . fish oil-omega-3 fatty acids 1000 MG capsule Take 2 g by mouth daily.   . furosemide (LASIX) 40 MG tablet Take 2 tabs in morning and 1 tab evening (Patient taking differently: Take 40-80 mg by mouth 2 (two) times daily. 80 mg in the morning and 40 mg at night) 08/09/2020: 80 mg in the morning & 40 at bedtime   . gabapentin (NEURONTIN) 100 MG capsule Take 2 capsules in am 1 capsule mid day and 1 capsule in evening   . glucose blood (FREESTYLE PRECISION NEO TEST) test strip Use as instructed   . HYDROcodone-acetaminophen (NORCO/VICODIN) 5-325 MG tablet Take 1 tablet by mouth every 6 (six) hours as needed for moderate pain.   Marland Kitchen latanoprost (XALATAN) 0.005 % ophthalmic solution Place 1 drop into both eyes at bedtime.    . Magnesium 250 MG TABS Take 1 tablet by mouth daily.   . Multiple Vitamin (MULTIVITAMIN WITH MINERALS) TABS tablet Take 1 tablet by mouth daily.   . Multiple  Vitamins-Minerals (PRESERVISION AREDS PO) Take 1 tablet by mouth daily.   . pantoprazole (PROTONIX) 40 MG tablet Take 1 tablet  by mouth daily. (Patient taking differently: Take 40 mg by mouth at bedtime.)   . Polyvinyl Alcohol-Povidone (REFRESH OP) Place 1 drop into both eyes daily as needed (dry eyes).   . rosuvastatin (CRESTOR) 10 MG tablet Take 1 tablet (10 mg total) by mouth daily. (Patient taking differently: Take 10 mg by mouth every 7 (seven) days. Wednesday of each week)   . Semaglutide, [name], (OZEMPIC, [name],) 2 MG/1.5ML SOPN Inject 1 mg into  the skin once a week. (Patient taking differently: Inject 1 mg into the skin every Wednesday.)   . tamsulosin (FLOMAX) 0.4 MG CAPS capsule Take 0.4 mg by mouth daily.    No facility-administered encounter medications on file as of 10/07/2020.    Patient Active Problem List   Diagnosis Date Noted  . 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[place])   . Left ankle swelling   . Diabetic foot infection ([place])   . Cerebral thrombosis with cerebral infarction 07/20/2020  . Thrombocytopenia ([place]) 07/19/2020  . Cerebral ischemia 07/18/2020  . Bilateral hearing loss 05/20/2020  . Does use hearing aid 05/20/2020  . Diabetic ulcer of left great toe ([place]) 01/16/2020  . Controlled type 2 diabetes mellitus with stable proliferative retinopathy of both eyes, with long-term current use of insulin ([place]) 01/06/2020  . Intermediate stage nonexudative age-related macular degeneration of both eyes 01/06/2020  . Left epiretinal membrane 01/06/2020  . Dr[place]en of right macula 01/06/2020  . AKI (acute kidney injury) ([place]) 11/02/2019  . Cellulitis 11/02/2019  . Elevated TSH 07/10/2018  . Nephropathy 02/28/2018  . Disturbance of skin sensation 02/28/2018  . PAD (peripheral artery disease) ([place]) 07/17/2017  . 110.1 10/21/2013  . Epiphora 04/24/2013  . Laxity of eyelid 04/24/2013  . Anemia 04/09/2013  . History of peripheral vascular disease 04/09/2013  . History of stroke 04/09/2013  . S/P unilateral BKA (below knee amputation) ([place]) 03/17/2013  . Diabetes mellitus type 2 in obese ([place]) 03/17/2013  . Mixed hyperlipidemia 03/17/2013  . Essential hypertension 03/17/2013  . Gout 03/17/2013  . CKD (chronic kidney disease), stage III ([place]) 03/17/2013  . OSA on CPAP 03/17/2013  . Anemia, iron deficiency 03/17/2013  . Diverticulosis of colon 03/17/2013  . RBBB 03/17/2013  . Punctal stenosis, acquired 10/02/2012    Conditions to be addressed/monitored: DMII and PAD; ADL IADL limitations and Limited access to caregiver  Care  Plan : Social Work [facility] Plan of Care  Updates made by [name] since 10/07/2020 12:00 AM    Problem: Mobility and Independence     Long-Range Goal: Mobility and Independence Optimized   Start Date: 08/24/2020  Expected End Date: 12/23/2020  This Visit's Progress: On track  Recent Progress: On track  Priority: High  Note:   Current Barriers:  . Chronic disease management support and education needs related to DM and PAD   . Limited access to caregiver . Unable to independently prepare meals  Social Worker Clinical Goal(s):  Marland Kitchen Over the next 120 days, patient will work with SW to identify and address any acute and/or chronic care coordination needs related to the self health management of DM and PAD   . Over the next 30 days the patient and his daughter will work with SW to identify caregiver resources for in the home . Over the next 10 days the patient will be placed on the wait list for mobile meals  Goal Met  CCM SW Interventions: Completed with the [name] . Inter-disciplinary care team collaboration (see longitudinal plan of care) . Collaboration with [name], [name] regarding development and update of comprehensive plan of care as evidenced by provider attestation and co-signature . Successful outbound call placed to the [name] to assess goal progression . Determined Micheal Walls did contact [employer] regarding patient need for a ramp at the home - Mrs. Micheal Walls reports they mailed an application to the patients home which was completed and returned on 4.26.22 . Discussed the patients 30 days of [employer] is about to expire - this has been an excellent resource for the patient while he continues to regain strength following recent hospitalization . Referred the patient to [facility] to provide 14 days of meals each month for up to 6 months while the patient is on the wait list to receive Mobile Meals via [employer] [facility]  [place] . Scheduled follow up call over the next 45 days  Patient Goals/Self-Care Activities . Over the next 30 days, patient will: With the help of his children  - Patient will self administer medications as prescribed Patient will attend all scheduled provider appointments Patient will call provider office for new concerns or questions Participate in home health upon hospital discharge Contact SW as needed prior to next scheduled call Work with [facility] to assess eligibility to obtain a ramp  Follow Up Plan:  SW will follow up with the patient over the next 45 days       Follow Up Plan: SW will follow up with patient by phone over the next 45 days      [name], [name], [name], Certified Dementia Practitioner [place] [facility] [place] [facility] [phone]  Total time spent performing care coordination and/or care management activities with the patient by phone or face to face = 18 minutes.

## 2020-10-07 NOTE — Chronic Care Management (AMB) (Signed)
Chronic Care Management Pharmacy Assistant   Name: Micheal Walls  MRN: [id number] DOB: 02/14/1936  Reason for Encounter: Medication Coordination for Enhanced Pharmacy Services    10/07/2020- Patient will begin using [facility] delivery services, Micheal Walls, CPP spoke with patient and reviewed medication needs. PharmD would like for me to reach out to past pharmacy to retrieve provider names for some medications that were not prescribed by PCP and to please contact daughter.  Called daughter Micheal Walls- Left message to return call.   Called Micheal [facility] to inquire on who prescribed Allopurinol and Plavix for patient. Spoke with Micheal Walls, she informed me that the Allopurinol was last prescribed by Micheal Walls but she did not see a prescription for Plavix. [facility] is aware patient will be using [facility] for home deliver and adherence packaging, he does not have any remaining refill on his prescriptions.   Called [facility] to inquire who prescribed the Plavix for patient, per technician Micheal [facility]s is the prescriber listed on the prescription.   10/14/2020- Called daughter to discuss medications needed to begin using [facility], daughter answered but needed to call me back or she states it was ok to call her in the morning. She was at the [facility] with a friend helping her after surgery procedure. Daughter Micheal Walls aware I will call her tomorrow morning.   10/15/2020- Spoke with daughter Micheal Walls, we reviewed medications that patient has on hand.Patient is almost out of  Plavix, Furosemide, Diltiazem, Pantoprazole, and Gabapentin. Request sent to PCP and 30 day supply was sent to Micheal [facility]. Daughter aware I will call Cardiology and Nephrology regarding who prescribes medications in question.  Called Cardiology- Micheal [facility], per office patient is not being prescibed any medications from Micheal. [name]. Patient did not have an appt  scheduled with Cardiology, scheduled a follow up appointment with first available physician Micheal Renshaw, PA for 11/12/2020 at 9:15 am.  [facility] the office of Micheal Walls, spoke with Micheal Walls, was transferred to [facility]al [facility]- [facility]heal [facility] nurse phone line, left message to return call.  Called Daughter back with updates, she is aware of Cardiology response on medications and appointment and that I am still waiting for a return call from Nephrology.   10/21/2020- Spoke with Micheal [facility] at [facility], [facility]. Lacy Walls prescribes Allopurinol, Flomax and Colchrist for patient. Micheal [facility] will send a 30 day supply to Micheal and a 90 day supply to [facility].  Patient has a follow up appointment with PCP- Micheal [facility]s on 10/29/2020 for [facility] Follow up.   11/02/2020- Called patient daughter Micheal Walls, to follow up on final day supply of medication to start patient with [facility]. We reviewed medication due to be delivered in a week, I had questions on Ozempic, multivitamin and eye drops need. Per daughter patient has 2 Ozempic pens left, he injects once a week and will need a refill also, daughter would like to hold on all Vitamins until patient has healed from toe amputation. Daughter didn't think patient needed any refills of eye drops but after speaking with him, he will need Lantanoprost and Dorzolamide soon, running low. Daughter gave me the name and number to [facility]- will call for refills. Patient would also like Freestyle Libre sensors sent by [facility]. Patient has one sensor left and will change in 2 days. Patient changes sensor every 14 days. Request sent to PCP for refill to [facility]. Daughter also mentioned  that patient requested pain medication for pain he was feeling in his toes, he is requesting Hydrocodone but daughter had to remind patient that medication could be addictive, he had #10 pills to use PRN and she bought him some  Tylenol to use as needed also. Patient has a wound nurse coming out 3 times a week to change his amputation dressing, patient told daughter he will ask nurse about pain medication.  Onboarding form almost complete to send to Micheal Walls, CPP for review.  11/04/2020- Called [facility]heal [facility] office to request refills of eye drops, no answer, left voicemail of patient name and dob and refill request and to return call. Onboard form completed, sent to [facility] to start adherence delivery process.  11/10/2020- Called Micheal Walls, patient daughter, to update price on medications from [facility], medication total will be $191 for medications cash price due to not having prescription coverage. Called daughter, no answer, left message to return call.   Medications: Outpatient Encounter Medications as of 10/07/2020  Medication Sig Note  . allopurinol (ZYLOPRIM) 100 MG tablet Take 100 mg by mouth daily.   Marland Kitchen amoxicillin-clavulanate (AUGMENTIN) 875-125 MG tablet Take 1 tablet by mouth every 12 (twelve) hours.   Marland Kitchen aspirin EC 81 MG tablet Take 81 mg by mouth daily. Swallow whole.   . Biotin 5000 MCG CAPS Take 5,000 mcg by mouth daily.   [name] Apply between toes as needed for maceration.   . colchicine 0.6 MG tablet Take 0.6 mg by mouth daily as needed (gout flare up).   . Continuous Blood Gluc Sensor (FREESTYLE LIBRE 14 DAY SENSOR) MISC Use as directed to check blood sugars   . diltiazem (CARDIZEM CD) 300 MG 24 hr capsule Take 300 mg by mouth daily.   . dorzolamide (TRUSOPT) 2 % ophthalmic solution Place 1 drop into both eyes at bedtime.   Marland Kitchen doxycycline (VIBRA-TABS) 100 MG tablet Take 1 tablet (100 mg total) by mouth every 12 (twelve) hours.   . ferrous sulfate 325 (65 FE) MG tablet Take 325 mg by mouth every Monday, Wednesday, and Friday.   . fish oil-omega-3 fatty acids 1000 MG capsule Take 2 g by mouth daily.   Marland Kitchen glucose blood (FREESTYLE PRECISION NEO TEST) test strip Use  as instructed   . HYDROcodone-acetaminophen (NORCO/VICODIN) 5-325 MG tablet Take 1 tablet by mouth every 6 (six) hours as needed for moderate pain.   Marland Kitchen latanoprost (XALATAN) 0.005 % ophthalmic solution Place 1 drop into both eyes at bedtime.    . Magnesium 250 MG TABS Take 1 tablet by mouth daily.   . Multiple Vitamin (MULTIVITAMIN WITH MINERALS) TABS tablet Take 1 tablet by mouth daily.   . Multiple Vitamins-Minerals (PRESERVISION AREDS PO) Take 1 tablet by mouth daily.   . Polyvinyl Alcohol-Povidone (REFRESH OP) Place 1 drop into both eyes daily as needed (dry eyes).   . rosuvastatin (CRESTOR) 10 MG tablet Take 1 tablet (10 mg total) by mouth daily. (Patient taking differently: Take 10 mg by mouth every 7 (seven) days. Wednesday of each week)   . Semaglutide, [name], (OZEMPIC, [name],) 2 MG/1.5ML SOPN Inject 1 mg into the skin once a week. (Patient taking differently: Inject 1 mg into the skin every Wednesday.)   . tamsulosin (FLOMAX) 0.4 MG CAPS capsule Take 0.4 mg by mouth daily.   . [DISCONTINUED] clopidogrel (PLAVIX) 75 MG tablet Take 75 mg by mouth daily.   . [DISCONTINUED] furosemide (LASIX) 40 MG tablet  Take 2 tabs in morning and 1 tab evening (Patient taking differently: Take 40-80 mg by mouth 2 (two) times daily. 80 mg in the morning and 40 mg at night) 08/09/2020: 80 mg in the morning & 40 at bedtime   . [DISCONTINUED] gabapentin (NEURONTIN) 100 MG capsule Take 2 capsules in am 1 capsule mid day and 1 capsule in evening   . [DISCONTINUED] pantoprazole (PROTONIX) 40 MG tablet Take 1 tablet  by mouth daily. (Patient taking differently: Take 40 mg by mouth at bedtime.)    No facility-administered encounter medications on file as of 10/07/2020.    Star Rating Drugs: Rosuvastatin- last filled 09/02/2019 for 90 day supply (Medication was reduced to 1 tablet weekly) Ozempic- Last filled 04/22/2020 for   SIG: [name], [place]

## 2020-10-07 NOTE — Telephone Encounter (Signed)
Faxed [phone])wound care order to [facility],received confirmation.

## 2020-10-07 NOTE — Progress Notes (Signed)
Chronic Care Management Pharmacy Note  10/09/2020 Name:  Micheal Walls MRN:  [id number] DOB:  [birth year]  Subjective: Micheal Walls is an 85 y.o. year old male who is a primary patient of [name], [place].  The CCM team was consulted for assistance with disease management and care coordination needs.  Patient would like to be onboarded to the pharmacy.   Engaged with patient by telephone for follow up visit in response to provider referral for pharmacy case management and/or care coordination services.   Consent to Services:  The patient was given information about Chronic Care Management services, agreed to services, and gave verbal consent prior to initiation of services.  Please see initial visit note for detailed documentation.   Patient Care Team: [name], [name] as PCP - General (General Practice) [name], [name], MD as PCP - Cardiology (Cardiology) Lynne Logan, RN as [place] Management [place], Tillie Rung as [place], Delma Villalva J, [facility] (Pharmacist)  Recent office visits: 09/28/2020- PCP OV   Recent consult visits: 09/07/2020- Retina specialist  09/07/2020- Wound specialist  09/04/2020- Vascular Surgery OV  09/01/2020- Neurology [facility] visits: 09/11/2020 -Cellulitis of great toe  Objective:  Lab Results  Component Value Date   CREATININE 2.01 (H) 09/28/2020   BUN 58 (H) 09/28/2020   GFRNONAA 42 (L) 09/15/2020   GFRAA 42 (L) 05/20/2020   NA 139 09/28/2020   K 4.6 09/28/2020   CALCIUM 10.6 (H) 09/28/2020   CO2 25 09/28/2020   GLUCOSE 105 (H) 09/28/2020    Lab Results  Component Value Date/Time   HGBA1C 6.1 (H) 07/19/2020 03:30 AM   HGBA1C 6.2 (H) 05/20/2020 09:51 AM   MICROALBUR 30 09/28/2020 03:58 PM   MICROALBUR 80 05/15/2019 10:28 AM    Last diabetic Eye exam:  Lab Results  Component Value Date/Time   HMDIABEYEEXA Retinopathy (A) 07/10/2020 12:00 AM    Last diabetic Foot exam: No  results found for: HMDIABFOOTEX   Lab Results  Component Value Date   CHOL 90 07/19/2020   HDL 38 (L) 07/19/2020   LDLCALC 43 07/19/2020   TRIG 44 07/19/2020   CHOLHDL 2.4 07/19/2020    Hepatic Function Latest Ref Rng & Units 09/28/2020 08/27/2020 08/17/2020  Total Protein 6.0 - 8.5 g/dL 7.6 7.1 6.8  Albumin 3.6 - 4.6 g/dL 3.9 - 3.6  AST 0 - 40 IU/L 22 29 42(H)  ALT 0 - 44 IU/L 27 44 57(H)  Alk Phosphatase 44 - 121 IU/L 92 - 80  Total Bilirubin 0.0 - 1.2 mg/dL 0.3 0.4 0.3  Bilirubin, Direct 0.00 - 0.40 mg/dL - - -    Lab Results  Component Value Date/Time   TSH 2.340 01/23/2020 02:54 PM   TSH 2.760 03/21/2019 10:35 AM    CBC Latest Ref Rng & Units 09/28/2020 09/15/2020 09/14/2020  WBC 3.4 - 10.8 x10E3/uL 7.4 6.0 7.8  Hemoglobin 13.0 - 17.7 g/dL 9.9(L) 9.0(L) 8.6(L)  Hematocrit 37.5 - 51.0 % 29.5(L) 28.1(L) 26.5(L)  Platelets 150 - 450 x10E3/uL 288 268 247    No results found for: VD25OH  Clinical ASCVD: Yes  The ASCVD Risk score [name] DC Jr., et al., 2013) failed to calculate for the following reasons:   The 2013 ASCVD risk score is only valid for ages 35 to 39   The patient has a prior MI or stroke diagnosis    Depression screen [facility] 2/9 05/20/2020 09/24/2019 06/25/2019  Decreased Interest 0 0 0  Down,  Depressed, Hopeless 0 0 0  PHQ - 2 Score 0 0 0  Altered sleeping - - -  Tired, decreased energy - - -  Change in appetite - - -  Feeling bad or failure about yourself  - - -  Trouble concentrating - - -  Moving slowly or fidgety/restless - - -  Suicidal thoughts - - -  PHQ-9 Score - - -  Difficult doing work/chores - - -  Some recent data might be hidden     Social History   Tobacco Use  Smoking Status Former Smoker  . Types: Cigars  . Quit date: 06/12/2001  . Years since quitting: 19.3  Smokeless Tobacco Never Used   BP Readings from Last 3 Encounters:  09/28/20 138/70  09/16/20 (!) 122/42  09/04/20 115/80   Pulse Readings from Last 3 Encounters:  09/28/20  78  09/16/20 86  09/04/20 81   Wt Readings from Last 3 Encounters:  09/11/20 253 lb 9.6 oz (115 kg)  09/04/20 253 lb 9.6 oz (115 kg)  08/17/20 280 lb (127 kg)   BMI Readings from Last 3 Encounters:  09/11/20 33.46 kg/m  09/04/20 33.46 kg/m  09/01/20 36.94 kg/m    Assessment/Interventions: Review of patient past medical history, allergies, medications, health status, including review of consultants reports, laboratory and other test data, was performed as part of comprehensive evaluation and provision of chronic care management services.   SDOH:  (Social Determinants of Health) assessments and interventions performed: No  SDOH Screenings   Alcohol Screen: Not on file  Depression (PHQ2-9): Low Risk   . PHQ-2 Score: 0  Financial Resource Strain: Low Risk   . Difficulty of Paying Living Expenses: Not hard at all  Food Insecurity: No Food Insecurity  . Worried About Charity fundraiser in the Last Year: Never true  . Ran Out of Food in the Last Year: Never true  Housing: Not on file  Physical Activity: Inactive  . Days of Exercise per Week: 0 days  . Minutes of Exercise per Session: 0 min  Social Connections: Not on file  Stress: No Stress Concern Present  . Feeling of Stress : Not at all  Tobacco Use: Medium Risk  . Smoking Tobacco Use: Former Smoker  . Smokeless Tobacco Use: Never Used  Transportation Needs: No Transportation Needs  . Lack of Transportation (Medical): No  . Lack of Transportation (Non-Medical): No    CCM Care Plan  Allergies  Allergen Reactions  . Vancomycin Rash    Medications Reviewed Today    Reviewed by [name], DPM (Physician) on 10/05/20 at 57  Med List Status: <None>  Medication Order Taking? Sig Documenting Provider Last Dose Status Informant  allopurinol (ZYLOPRIM) 100 MG tablet [id number] No Take 100 mg by mouth daily. [provider] 09/11/2020 Unknown time Active Child  amoxicillin-clavulanate (AUGMENTIN) 875-125 MG  tablet [id number]  Take 1 tablet by mouth every 12 (twelve) hours. [name], DPM  Active   aspirin EC 81 MG tablet [id number] No Take 81 mg by mouth daily. Swallow whole. [provider] 09/11/2020 Unknown time Active Child  Biotin 5000 MCG CAPS [id number] No Take 5,000 mcg by mouth daily. [provider] 09/11/2020 Unknown time Active Child  [name] [id number] No Apply between toes as needed for maceration. [name], DPM unk Active Child  clopidogrel (PLAVIX) 75 MG tablet [id number] No Take 75 mg by mouth daily. [provider] 09/11/2020 Unknown time  Active Child  colchicine 0.6 MG tablet [id number] No Take 0.6 mg by mouth daily as needed (gout flare up). [provider] unk Active Child  Continuous Blood Gluc Sensor (FREESTYLE LIBRE 14 DAY SENSOR) [place] [id number] No Use as directed to check blood sugars [name], [name] Taking Active Child  diltiazem (CARDIZEM CD) 300 MG 24 hr capsule [id number] No Take 300 mg by mouth daily. [provider] 09/11/2020 Unknown time Active Child  dorzolamide (TRUSOPT) 2 % ophthalmic solution [id number] No Place 1 drop into both eyes at bedtime. [provider] 09/10/2020 Unknown time Active Child  doxycycline (VIBRA-TABS) 100 MG tablet [id number]  Take 1 tablet (100 mg total) by mouth every 12 (twelve) hours. [name], DPM  Active   ferrous sulfate 325 (65 FE) MG tablet [id number] No Take 325 mg by mouth every Monday, Wednesday, and Friday. [provider] 09/11/2020 Unknown time Active Child  fish oil-omega-3 fatty acids 1000 MG capsule [id number] No Take 2 g by mouth daily. [provider] 09/11/2020 Unknown time Active Child  furosemide (LASIX) 40 MG tablet [id number] No Take 2 tabs in morning and 1 tab evening  Patient taking differently: Take 40-80 mg by mouth 2 (two) times daily. 80 mg in the morning and 40 mg at night   [name], [name] 09/11/2020 Unknown time  Active Child           Med Note Jonell Cluck, Rhett Bannister   Sun Aug 09, 2020 12:30 PM) 80 mg in the morning & 40 at bedtime   gabapentin (NEURONTIN) 100 MG capsule [id number] No Take 2 capsules in am 1 capsule mid day and 1 capsule in evening [name], [name] 09/11/2020 Unknown time Active Child  glucose blood (FREESTYLE PRECISION NEO TEST) test strip [id number] No Use as instructed [name], [name] Taking Active Child  HYDROcodone-acetaminophen (NORCO/VICODIN) 5-325 MG tablet [id number]  Take 1 tablet by mouth every 6 (six) hours as needed for moderate pain. Kathie Dike, MD  Active   latanoprost (XALATAN) 0.005 % ophthalmic solution [id number] No Place 1 drop into both eyes at bedtime.  [provider] 09/10/2020 Unknown time Active Child  Magnesium 250 MG TABS [id number] No Take 1 tablet by mouth daily. [provider] 09/11/2020 Unknown time Active Child  Multiple Vitamin (MULTIVITAMIN WITH MINERALS) TABS tablet [id number] No Take 1 tablet by mouth daily. [provider] 09/11/2020 Unknown time Active Child  Multiple Vitamins-Minerals (PRESERVISION AREDS PO) [id number] No Take 1 tablet by mouth daily. [provider] 09/11/2020 Unknown time Active Child  pantoprazole (PROTONIX) 40 MG tablet [id number] No Take 1 tablet  by mouth daily.  Patient taking differently: Take 40 mg by mouth at bedtime.   [name], [name] 09/10/2020 Unknown time Active Child  Polyvinyl Alcohol-Povidone (REFRESH OP) [id number] No Place 1 drop into both eyes daily as needed (dry eyes). [provider] unk Active Child  rosuvastatin (CRESTOR) 10 MG tablet [id number] No Take 1 tablet (10 mg total) by mouth daily.  Patient taking differently: Take 10 mg by mouth every 7 (seven) days. Wednesday of each week   Bonnielee Haff, MD 09/09/2020 Active Child  Semaglutide, [name], (OZEMPIC, [name],) 2 MG/1.5ML SOPN [id number] No Inject 1 mg into the skin once a week.  Patient taking differently: Inject  1 mg into the skin every Wednesday.   [name], [name] 09/09/2020 Active Child           Med Note Duffy Bruce, Legrand Como   Thu Jan 16, 2020  8:46 PM)    tamsulosin (FLOMAX) 0.4 MG CAPS capsule [id number] No Take 0.4 mg by mouth daily. [provider] 09/11/2020 Unknown time Active Child          Patient Active Problem List   Diagnosis Date Noted  . 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[place])   . Left ankle swelling   . Diabetic foot infection ([place])   . Cerebral thrombosis with cerebral infarction 07/20/2020  . Thrombocytopenia ([place]) 07/19/2020  . Cerebral ischemia 07/18/2020  . Bilateral hearing loss 05/20/2020  . Does use hearing aid 05/20/2020  . Diabetic ulcer of left great toe ([place]) 01/16/2020  . Controlled type 2 diabetes mellitus with stable proliferative retinopathy of both eyes, with long-term current use of insulin ([place]) 01/06/2020  . Intermediate stage nonexudative age-related macular degeneration of both eyes 01/06/2020  . Left epiretinal membrane 01/06/2020  . [place] of right macula 01/06/2020  . AKI (acute kidney injury) ([place]) 11/02/2019  . Cellulitis 11/02/2019  . Elevated TSH 07/10/2018  . Nephropathy 02/28/2018  . Disturbance of skin sensation 02/28/2018  . PAD (peripheral artery disease) ([place]) 07/17/2017  . 110.1 10/21/2013  . Epiphora 04/24/2013  . Laxity of eyelid 04/24/2013  . Anemia 04/09/2013  . History of peripheral vascular disease 04/09/2013  . History of stroke 04/09/2013  . S/P unilateral BKA (below knee amputation) ([place]) 03/17/2013  . Diabetes mellitus type 2 in obese ([place]) 03/17/2013  . Mixed hyperlipidemia 03/17/2013  . Essential hypertension 03/17/2013  . Gout 03/17/2013  . CKD (chronic kidney disease), stage III ([place]) 03/17/2013  . OSA on CPAP 03/17/2013  . Anemia, iron deficiency 03/17/2013  . Diverticulosis of colon 03/17/2013  . RBBB 03/17/2013  . Punctal stenosis, acquired 10/02/2012    Immunization History  Administered Date(s) Administered   . Fluad Quad(high Dose 65+) 05/20/2020  . Influenza, High Dose Seasonal PF 03/21/2019  . PFIZER(Purple Top)SARS-COV-2 Vaccination 08/31/2019, 09/25/2019, 04/03/2020  . Pneumococcal Conjugate-13 02/23/2016  . Pneumococcal Polysaccharide-23 05/15/2019  . Tdap 02/04/2013    Conditions to be addressed/monitored:  Hyperlipidemia and Diabetes  Care Plan : [place]  Updates made by Mayford Knife, RPH since 10/09/2020 12:00 AM    Problem: HTN, HLD, DM II, GOUT, NEUROPATHY   Priority: High    Long-Range Goal: Disease Management   Recent Progress: On track  Priority: High  Note:     Current Barriers:  . Unable to independently monitor therapeutic efficacy . Unable to self administer medications as prescribed . Does not adhere to prescribed medication regimen   Pharmacist Clinical Goal(s):  Marland Kitchen Patient will achieve adherence to monitoring guidelines and medication adherence to achieve therapeutic efficacy . contact provider office for questions/concerns as evidenced notation of same in electronic health record through collaboration with PharmD and provider.    Interventions: . 1:1 collaboration with [name], [name] regarding development and update of comprehensive plan of care as evidenced by provider attestation and co-signature . Inter-disciplinary care team collaboration (see longitudinal plan of care) . Comprehensive medication review performed; medication list updated in electronic medical record  Hypertension (BP goal <130/80) -Controlled -Current treatment: . Diltiazem 300 mg capsule take once per day  -Current home readings: patient is not currently checking his BP at home  -Current dietary habits: will discuss in greater detail at next office visit  -Current exercise habits: patient is not currently exercising  -Denies hypotensive/hypertensive symptoms -Educated on BP goals and benefits of medications for prevention of heart attack, stroke and kidney  damage;  Daily salt intake goal < 2300 mg; Exercise goal of 150 minutes per week; Proper BP monitoring technique; -Counseled to monitor BP at home once a day, document, and provide log at future appointments -Using large BP cuff that daughter purchased  -Counseled on diet and exercise extensively Recommended to continue current medication -Will have CPA onboard patient to [facility] pharmacy to help with medication adherence and accessibility.   Hyperlipidemia: (LDL goal < 70) -Controlled -Current treatment: . Rosuvastatin 10 mg tablet daily  -Current dietary patterns: patient reports that he is not eating as healthy as he should. Eating some fried foods.  -Current exercise habits: at this time the patient is not doing any exercises.  -Educated on Cholesterol goals;  Benefits of statin for ASCVD risk reduction; Importance of limiting foods high in cholesterol; -Recommended to continue current medication -Will collaborate with PCP team to change patients medication to Atorvastatin 10 mg tablet on Monday, Wednesday and Friday.     Diabetes (A1c goal <7%) -Controlled -Current medications: . Ozempic 1 mg  - inject once a week on Wednesday  -Current home glucose readings  Patient has a CGM and his BS this morning is 114    At this time he does not allow his daughter access to the  numbers.  -Denies hypoglycemic/hyperglycemic symptoms -Current meal patterns: Will discuss at next visit  . drinks: Glucerna three times per day  -Current exercise: none noted  -Educated on A1c and blood sugar goals; Complications of diabetes including kidney damage, retinal damage, and cardiovascular disease; Prevention and management of hypoglycemic episodes; Benefits of routine self-monitoring of blood sugar; Continuous glucose monitoring; -Counseled to check feet daily and get yearly eye exams -Recommended to continue current medication Counseled on the importance of CGM.   GOUT (Goal: prevent flare  ups) -Controlled -Current treatment  . Allopurinol 100 mg tablet twice per day  . Colcrys 0.6 mg - as needed for gout flare up  -Counseled on diet and exercise extensively Recommended that patient continue to be careful with his diet.   Neuropathy (Goal: to prevent nerve pain) -Controlled -Current treatment  . Gabapentin 100 mg capsule: Two in the morning, 1 at midday and 1 at night.  -Recommended to continue current medication  Query BPH -Controlled -Current treatment  . Tamsulosin 0.4 mg capsule once per day  -Recommended to continue current medication Will collaborate with PCP and other providers to dertemine what medications patient should be currently taking for BPH.     Health Maintenance -Vaccine Therapy:  will discuss with patient getting zoster vaccine during next office visit.  -Current therapy:  . Ferrous Sulfate 325 (65 FE) - take 1 tablet twice per day on Monday, Wednesday and Friday and take one tablet all other days.  . Preservision  . Refresh Ophthalmic  . Colace 100 mg taking one capsule daily  -Educated on Herbal supplement research is limited and benefits usually cannot be proven Cost vs benefit of each product must be carefully weighed by individual consumer Supplements may interfere with prescription drugs -Patient is satisfied with current therapy and denies issues -Recommended to continue current medication   Patient Goals/Self-Care Activities . Patient will:  - take medications as prescribed focus on medication adherence by using a pill packaging system  check glucose using CGM system, document, and provide at future appointments  Patient Goals/Self-Care Activities . Patient will:  - take medications as prescribed  Follow Up Plan: The patient has been provided with contact information for the care management team  and has been advised to call with any health related questions or concerns.       Medication Assistance: None required.  Patient  affirms current coverage meets needs.  Patient's preferred pharmacy is:  [id number] [place], [place] [address] [address]Phone: [phone]: [phone] [place] [address]Phone: [phone]: [phone] [facility] [place] [address] [place]: [phone]: [phone] Uses pill box? No - patient keeps medications near him in vials. Pt endorses 70% compliance  We discussed: Benefits of medication synchronization, packaging and delivery as well as enhanced pharmacist oversight with [facility]. Patient decided to: Utilize [facility] pharmacy for medication synchronization, packaging and delivery  Care Plan and Follow Up Patient Decision:  Patient agrees to Care Plan and Follow-up.  Plan: The patient has been provided with contact information for the care management team and has been advised to call with any health related questions or concerns.   VOrlando Penner PharmD Clinical Pharmacist [facility] [phone]

## 2020-10-07 NOTE — Patient Instructions (Signed)
Social Worker Visit Information  Goals we discussed today:  Goals Addressed            This Visit's Progress   . Mobility and Independence Optimized   On track    Timeframe:  Long-Range Goal Priority:  High Start Date:  3.14.22                          Expected End Date: 7.13.22                       Next planned outreach: 5.31.22  Patient Goals/Self-Care Activities . Over the next 30 days, patient will: With the help of his children  - Patient will self administer medications as prescribed Patient will attend all scheduled provider appointments Patient will call provider office for new concerns or questions Participate in home health upon discharge Contact SW as needed prior to next scheduled call Work with [facility] to assess eligibility to obtain a ramp        Materials Provided: Verbal education about community resources provided by phone  Follow Up Plan: SW will follow up with patient by phone over the next 45 days   [name], [name], [name], Certified Dementia Practitioner [place] [facility] [place] [facility] [phone]

## 2020-10-08 ENCOUNTER — Telehealth: Payer: Self-pay | Admitting: Podiatry

## 2020-10-08 NOTE — Telephone Encounter (Signed)
[facility] about Micheal Walls stating they need treatment orders for his left foot.  Call back number  [phone]

## 2020-10-08 NOTE — Telephone Encounter (Signed)
Per Ammie's note yesterday she faxed my order and it was confirmed receipt. Re-send if necessary

## 2020-10-09 DIAGNOSIS — M869 Osteomyelitis, unspecified: Secondary | ICD-10-CM | POA: Diagnosis not present

## 2020-10-09 DIAGNOSIS — I69398 Other sequelae of cerebral infarction: Secondary | ICD-10-CM | POA: Diagnosis not present

## 2020-10-09 DIAGNOSIS — Z89412 Acquired absence of left great toe: Secondary | ICD-10-CM | POA: Diagnosis not present

## 2020-10-09 DIAGNOSIS — E1169 Type 2 diabetes mellitus with other specified complication: Secondary | ICD-10-CM | POA: Diagnosis not present

## 2020-10-09 DIAGNOSIS — Z4781 Encounter for orthopedic aftercare following surgical amputation: Secondary | ICD-10-CM | POA: Diagnosis not present

## 2020-10-09 DIAGNOSIS — Z4801 Encounter for change or removal of surgical wound dressing: Secondary | ICD-10-CM | POA: Diagnosis not present

## 2020-10-09 NOTE — Patient Instructions (Signed)
Visit Information It was great speaking with you today!  Please let me know if you have any questions about our visit.  Goals Addressed            This Visit's Progress   . Manage My Medicine       Timeframe:  Long-Range Goal Priority:  High Start Date:                             Expected End Date:                       Follow Up Date 10/21/2020  - keep a list of all the medicines I take; vitamins and herbals too - use a pillbox to sort medicine - use an alarm clock or phone to remind me to take my medicine    Why is this important?   . These steps will help you keep on track with your medicines.        Patient Care Plan: Assist with Chronic Care Management and Care Coordination needs  Completed 10/01/2020  Problem Identified: Assist with Chronic Care Management and Care Coordination needs Resolved 09/17/2020  Priority: High    Goal: Assist with Chronic Care Management and Care Coordination needs Completed 09/17/2020  Start Date: 08/24/2020  Expected End Date: 10/25/2020  Recent Progress: On track  Priority: High  Note:   Current Barriers:  Marland Kitchen Knowledge Barriers related to resources and support available to address needs related to PAD, Cellulitis of left lower extremity, Neuropathy, Mixed hyperlipidemia, Type 2 diabetes mellitus  Case Manager Clinical Goal(s):  Marland Kitchen Over the next 60 days, patient will work with [name] to address needs related to disease education and support for chronic conditions including: PAD, Cellulitis of left lower extremity, Neuropathy, Mixed hyperlipidemia, Type 2 diabetes mellitus  Interventions:  . Collaborated with  embedded [name] [name]  to establish an individualized plan of care  Patient Self Care Activities:  . Patient will work with the CCM RN CM to address care coordination needs and will continue to work with the clinical team to address health care and disease management related needs.    Next Follow Up Date: 08/26/20    Patient Care  Plan: Social Work [facility] Plan of Care    Problem Identified: Mobility and Independence     Long-Range Goal: Mobility and Independence Optimized   Start Date: 08/24/2020  Expected End Date: 12/23/2020  This Visit's Progress: On track  Recent Progress: On track  Priority: High  Note:   Current Barriers:  . Chronic disease management support and education needs related to DM and PAD   . Limited access to caregiver . Unable to independently prepare meals  Social Worker Clinical Goal(s):  Marland Kitchen Over the next 120 days, patient will work with SW to identify and address any acute and/or chronic care coordination needs related to the self health management of DM and PAD   . Over the next 30 days the patient and his daughter will work with SW to identify caregiver resources for in the home . Over the next 10 days the patient will be placed on the wait list for mobile meals Goal Met  CCM SW Interventions: Completed with the patients daughter [name] . Inter-disciplinary care team collaboration (see longitudinal plan of care) . Collaboration with [name], [name] regarding development and update of comprehensive plan of care as evidenced by provider attestation and  co-signature . Successful outbound call placed to the patients daughter [name] to assess goal progression . Determined Mrs. Mills did contact [place] [facility] regarding patient need for a ramp at the home - Mrs. [name] reports they mailed an application to the patients home which was completed and returned on 4.26.22 . Discussed the patients 30 days of [employer] is about to expire - this has been an excellent resource for the patient while he continues to regain strength following recent hospitalization . Referred the patient to [facility] to provide 14 days of meals each month for up to 6 months while the patient is on the wait list to receive Mobile Meals via [employer] [facility] [place] . Scheduled follow up call over the next 45  days  Patient Goals/Self-Care Activities . Over the next 30 days, patient will: With the help of his children  - Patient will self administer medications as prescribed Patient will attend all scheduled provider appointments Patient will call provider office for new concerns or questions Participate in home health upon hospital discharge Contact SW as needed prior to next scheduled call Work with [place] [facility] to assess eligibility to obtain a ramp  Follow Up Plan:  SW will follow up with the patient over the next 45 days    Patient Care Plan: CCM Pharmacy Care Plan    Problem Identified: HTN, HLD, DM II, GOUT, NEUROPATHY   Priority: High    Long-Range Goal: Disease Management   Recent Progress: On track  Priority: High  Note:     Current Barriers:  . Unable to independently monitor therapeutic efficacy . Unable to self administer medications as prescribed . Does not adhere to prescribed medication regimen   Pharmacist Clinical Goal(s):  Marland Kitchen Patient will achieve adherence to monitoring guidelines and medication adherence to achieve therapeutic efficacy . contact provider office for questions/concerns as evidenced notation of same in electronic health record through collaboration with PharmD and provider.    Interventions: . 1:1 collaboration with [name], [name] regarding development and update of comprehensive plan of care as evidenced by provider attestation and co-signature . Inter-disciplinary care team collaboration (see longitudinal plan of care) . Comprehensive medication review performed; medication list updated in electronic medical record  Hypertension (BP goal <130/80) -Controlled -Current treatment: . Diltiazem 300 mg capsule take once per day  -Current home readings: patient is not currently checking his BP at home  -Current dietary habits: will discuss in greater detail at next office visit  -Current exercise habits: patient is not currently exercising  -Denies  hypotensive/hypertensive symptoms -Educated on BP goals and benefits of medications for prevention of heart attack, stroke and kidney damage; Daily salt intake goal < 2300 mg; Exercise goal of 150 minutes per week; Proper BP monitoring technique; -Counseled to monitor BP at home once a day, document, and provide log at future appointments -Using large BP cuff that daughter purchased  -Counseled on diet and exercise extensively Recommended to continue current medication -Will have CPA onboard patient to [facility] to help with medication adherence and accessibility.   Hyperlipidemia: (LDL goal < 70) -Controlled -Current treatment: . Rosuvastatin 10 mg tablet daily  -Current dietary patterns: patient reports that he is not eating as healthy as he should. Eating some fried foods.  -Current exercise habits: at this time the patient is not doing any exercises.  -Educated on Cholesterol goals;  Benefits of statin for ASCVD risk reduction; Importance of limiting foods high in cholesterol; -Recommended to continue  current medication -Will collaborate with PCP team to change patients medication to Atorvastatin 10 mg tablet on Monday, Wednesday and Friday.     Diabetes (A1c goal <7%) -Controlled -Current medications: . Ozempic 1 mg  - inject once a week on Wednesday  -Current home glucose readings  Patient has a CGM and his BS this morning is 114    At this time he does not allow his daughter access to the  numbers.  -Denies hypoglycemic/hyperglycemic symptoms -Current meal patterns: Will discuss at next visit  . drinks: Glucerna three times per day  -Current exercise: none noted  -Educated on A1c and blood sugar goals; Complications of diabetes including kidney damage, retinal damage, and cardiovascular disease; Prevention and management of hypoglycemic episodes; Benefits of routine self-monitoring of blood sugar; Continuous glucose monitoring; -Counseled to check feet daily  and get yearly eye exams -Recommended to continue current medication Counseled on the importance of CGM.   GOUT (Goal: prevent flare ups) -Controlled -Current treatment  . Allopurinol 100 mg tablet twice per day  . Colcrys 0.6 mg - as needed for gout flare up  -Counseled on diet and exercise extensively Recommended that patient continue to be careful with his diet.   Neuropathy (Goal: to prevent nerve pain) -Controlled -Current treatment  . Gabapentin 100 mg capsule: Two in the morning, 1 at midday and 1 at night.  -Recommended to continue current medication  Query BPH -Controlled -Current treatment  . Tamsulosin 0.4 mg capsule once per day  -Recommended to continue current medication Will collaborate with PCP and other providers to dertemine what medications patient should be currently taking for BPH.     Health Maintenance -Vaccine Therapy:  will discuss with patient getting zoster vaccine during next office visit.  -Current therapy:  . Ferrous Sulfate 325 (65 FE) - take 1 tablet twice per day on Monday, Wednesday and Friday and take one tablet all other days.  . Preservision  . Refresh Ophthalmic  . Colace 100 mg taking one capsule daily  -Educated on Herbal supplement research is limited and benefits usually cannot be proven Cost vs benefit of each product must be carefully weighed by individual consumer Supplements may interfere with prescription drugs -Patient is satisfied with current therapy and denies issues -Recommended to continue current medication   Patient Goals/Self-Care Activities . Patient will:  - take medications as prescribed focus on medication adherence by using a pill packaging system  check glucose using CGM system, document, and provide at future appointments  Patient Goals/Self-Care Activities . Patient will:  - take medications as prescribed  Follow Up Plan: The patient has been provided with contact information for the care management  team and has been advised to call with any health related questions or concerns.    Patient Care Plan: Cellulitis of Left Great Toe    Problem Identified: Status post amputation of great toe, left   Priority: High    Long-Range Goal: Status post amputation of great toe, left   Start Date: 09/17/2020  Expected End Date: 03/19/2021  This Visit's Progress: On track  Priority: High  Note:   Current Barriers:   Ineffective Self Health Maintenance   Unable to perform ADLs independently  Unable to perform IADLs independently  Status post amputation of great toe, left Clinical Goal(s):  Marland Kitchen Collaboration with [name], [name] regarding development and update of comprehensive plan of care as evidenced by provider attestation and co-signature . Inter-disciplinary care team collaboration (see longitudinal plan of care)  patient will work with care management team to address care coordination and chronic disease management needs related to Disease Management  Educational Needs  Care Coordination  Medication Management and Education  Psychosocial Support   Interventions:   Evaluation of current treatment plan related to  PAD/Status post amputation of great toe, left , self-management and patient's adherence to plan as established by provider.  Collaboration with [name], [name] regarding development and update of comprehensive plan of care as evidenced by provider attestation       and co-signature  Inter-disciplinary care team collaboration (see longitudinal plan of care)  Determined patient is s/p left great toe amputation and is home with daughter available to assist him as needed  Confirmed home health has contacted Mr. Wik and a home visit is scheduled  Determined patient is to leave his foot dressing intact until his s/p follow up surgical visit is completed  Confirmed patient has all medications on hand at home and verbalizes understanding his prescribed  regimen  Reviewed s/s suggestive of infection and or when to contact the doctor is needed  Reviewed and discussed post op follow appointment scheduled on 09/29/20 _0 :25 AM with Dr. Sherryle Lis . Reviewed medications with patient and discussed importance of medication adherence  Discussed plans with patient for ongoing care management follow up and provided patient with direct contact information for care management team 09/29/20 Per post op follow up completed with Dr. Sherryle Lis . Assessment: .    . 1. . Status post amputation of great toe, left ([place])  .  2. . Type II diabetes mellitus with peripheral circulatory disorder ([place])  .  3. . Diabetic peripheral neuropathy associated with type 2 diabetes mellitus ([place])  .  4. . Peripheral arterial disease ([place])  .    Marland Kitchen Plan: .  Patient was evaluated and treated and all questions answered. . S/p foot surgery left . -Progressing as expected post-operatively.  Seems to be healing well hopefully perfusion is adequate . -WB Status: WBAT in postop shoe . -Sutures: We will leave intact for another 2 weeks.  I would like to see him back in 1 week for reevaluation . -Foot redressed.  They should change at home 3 times a week with Betadine Self Care Activities:  . Continue to keep all scheduled follow up appointments . Take medications as directed  . Let your healthcare team know if you are unable to take your medications . Call your pharmacy for refills at least 7 days prior to running out of medication . Keep you post op follow up appointment with Dr. Sherryle Lis as scheduled for 09/29/20 _1 :15 AM . Call your surgeon if you develop s/s suggestive of infection or experiencing drainage, bleeding or increased pain to your surgical wound  Patient Goals: - to recover from my toe amputation  Follow Up Plan: Telephone follow up appointment with care management team member scheduled for:10/16/20       Patient agreed to services and verbal consent obtained.    The patient verbalized understanding of instructions, educational materials, and care plan provided today and agreed to receive a mailed copy of patient instructions, educational materials, and care plan.   [name], PharmD Clinical Pharmacist [facility] [phone]

## 2020-10-12 DIAGNOSIS — E1169 Type 2 diabetes mellitus with other specified complication: Secondary | ICD-10-CM | POA: Diagnosis not present

## 2020-10-12 DIAGNOSIS — Z89412 Acquired absence of left great toe: Secondary | ICD-10-CM | POA: Diagnosis not present

## 2020-10-12 DIAGNOSIS — M869 Osteomyelitis, unspecified: Secondary | ICD-10-CM | POA: Diagnosis not present

## 2020-10-12 DIAGNOSIS — I69398 Other sequelae of cerebral infarction: Secondary | ICD-10-CM | POA: Diagnosis not present

## 2020-10-12 DIAGNOSIS — Z4801 Encounter for change or removal of surgical wound dressing: Secondary | ICD-10-CM | POA: Diagnosis not present

## 2020-10-12 DIAGNOSIS — Z4781 Encounter for orthopedic aftercare following surgical amputation: Secondary | ICD-10-CM | POA: Diagnosis not present

## 2020-10-14 DIAGNOSIS — M869 Osteomyelitis, unspecified: Secondary | ICD-10-CM | POA: Diagnosis not present

## 2020-10-14 DIAGNOSIS — E1169 Type 2 diabetes mellitus with other specified complication: Secondary | ICD-10-CM | POA: Diagnosis not present

## 2020-10-14 DIAGNOSIS — Z89412 Acquired absence of left great toe: Secondary | ICD-10-CM | POA: Diagnosis not present

## 2020-10-14 DIAGNOSIS — I69398 Other sequelae of cerebral infarction: Secondary | ICD-10-CM | POA: Diagnosis not present

## 2020-10-14 DIAGNOSIS — Z4781 Encounter for orthopedic aftercare following surgical amputation: Secondary | ICD-10-CM | POA: Diagnosis not present

## 2020-10-14 DIAGNOSIS — Z4801 Encounter for change or removal of surgical wound dressing: Secondary | ICD-10-CM | POA: Diagnosis not present

## 2020-10-15 ENCOUNTER — Other Ambulatory Visit: Payer: Self-pay

## 2020-10-15 DIAGNOSIS — Z4801 Encounter for change or removal of surgical wound dressing: Secondary | ICD-10-CM | POA: Diagnosis not present

## 2020-10-15 DIAGNOSIS — I69398 Other sequelae of cerebral infarction: Secondary | ICD-10-CM | POA: Diagnosis not present

## 2020-10-15 DIAGNOSIS — M869 Osteomyelitis, unspecified: Secondary | ICD-10-CM | POA: Diagnosis not present

## 2020-10-15 DIAGNOSIS — G629 Polyneuropathy, unspecified: Secondary | ICD-10-CM

## 2020-10-15 DIAGNOSIS — Z4781 Encounter for orthopedic aftercare following surgical amputation: Secondary | ICD-10-CM | POA: Diagnosis not present

## 2020-10-15 DIAGNOSIS — E1169 Type 2 diabetes mellitus with other specified complication: Secondary | ICD-10-CM | POA: Diagnosis not present

## 2020-10-15 DIAGNOSIS — Z89412 Acquired absence of left great toe: Secondary | ICD-10-CM | POA: Diagnosis not present

## 2020-10-15 DIAGNOSIS — I739 Peripheral vascular disease, unspecified: Secondary | ICD-10-CM

## 2020-10-15 MED ORDER — GABAPENTIN 100 MG PO CAPS
ORAL_CAPSULE | ORAL | 0 refills | Status: DC
Start: 2020-10-15 — End: 2020-11-02

## 2020-10-15 MED ORDER — CLOPIDOGREL BISULFATE 75 MG PO TABS: 75.0000 mg | ORAL_TABLET | Freq: Every day | ORAL | 0 refills | Status: DC

## 2020-10-15 MED ORDER — PANTOPRAZOLE SODIUM 40 MG PO TBEC: 1.0000 | DELAYED_RELEASE_TABLET | Freq: Every day | ORAL | 0 refills | Status: DC

## 2020-10-15 MED ORDER — FUROSEMIDE 40 MG PO TABS: ORAL_TABLET | ORAL | 0 refills | Status: DC

## 2020-10-16 ENCOUNTER — Telehealth: Payer: Medicare Other

## 2020-10-16 ENCOUNTER — Ambulatory Visit (INDEPENDENT_AMBULATORY_CARE_PROVIDER_SITE_OTHER): Payer: Medicare Other

## 2020-10-16 DIAGNOSIS — E1159 Type 2 diabetes mellitus with other circulatory complications: Secondary | ICD-10-CM

## 2020-10-16 DIAGNOSIS — Z4801 Encounter for change or removal of surgical wound dressing: Secondary | ICD-10-CM | POA: Diagnosis not present

## 2020-10-16 DIAGNOSIS — E782 Mixed hyperlipidemia: Secondary | ICD-10-CM

## 2020-10-16 DIAGNOSIS — Z794 Long term (current) use of insulin: Secondary | ICD-10-CM

## 2020-10-16 DIAGNOSIS — E1169 Type 2 diabetes mellitus with other specified complication: Secondary | ICD-10-CM | POA: Diagnosis not present

## 2020-10-16 DIAGNOSIS — I69398 Other sequelae of cerebral infarction: Secondary | ICD-10-CM | POA: Diagnosis not present

## 2020-10-16 DIAGNOSIS — I739 Peripheral vascular disease, unspecified: Secondary | ICD-10-CM

## 2020-10-16 DIAGNOSIS — L03116 Cellulitis of left lower limb: Secondary | ICD-10-CM

## 2020-10-16 DIAGNOSIS — Z4781 Encounter for orthopedic aftercare following surgical amputation: Secondary | ICD-10-CM | POA: Diagnosis not present

## 2020-10-16 DIAGNOSIS — M869 Osteomyelitis, unspecified: Secondary | ICD-10-CM | POA: Diagnosis not present

## 2020-10-16 DIAGNOSIS — G629 Polyneuropathy, unspecified: Secondary | ICD-10-CM

## 2020-10-16 DIAGNOSIS — Z89412 Acquired absence of left great toe: Secondary | ICD-10-CM | POA: Diagnosis not present

## 2020-10-19 ENCOUNTER — Encounter: Payer: Self-pay | Admitting: Podiatry

## 2020-10-19 ENCOUNTER — Ambulatory Visit (INDEPENDENT_AMBULATORY_CARE_PROVIDER_SITE_OTHER): Payer: Medicare Other | Admitting: Podiatry

## 2020-10-19 ENCOUNTER — Other Ambulatory Visit: Payer: Self-pay

## 2020-10-19 DIAGNOSIS — L97522 Non-pressure chronic ulcer of other part of left foot with fat layer exposed: Secondary | ICD-10-CM | POA: Diagnosis not present

## 2020-10-19 DIAGNOSIS — E1151 Type 2 diabetes mellitus with diabetic peripheral angiopathy without gangrene: Secondary | ICD-10-CM

## 2020-10-19 DIAGNOSIS — I739 Peripheral vascular disease, unspecified: Secondary | ICD-10-CM

## 2020-10-19 DIAGNOSIS — E1142 Type 2 diabetes mellitus with diabetic polyneuropathy: Secondary | ICD-10-CM

## 2020-10-19 NOTE — Chronic Care Management (AMB) (Signed)
Chronic Care Management   CCM RN Visit Note  10/16/2020 Name: Micheal Walls MRN: [id number] DOB: 1936/01/08  Subjective: Micheal Walls is a 85 y.o. year old male who is a primary care patient of Micheal Walls, [place]. The care management team was consulted for assistance with disease management and care coordination needs.    Engaged with patient by telephone for follow up visit in response to provider referral for case management and/or care coordination services.   Consent to Services:  The patient was given information about Chronic Care Management services, agreed to services, and gave verbal consent prior to initiation of services.  Please see initial visit note for detailed documentation.   Patient agreed to services and verbal consent obtained.   Assessment: Review of patient past medical history, allergies, medications, health status, including review of consultants reports, laboratory and other test data, was performed as part of comprehensive evaluation and provision of chronic care management services.   SDOH (Social Determinants of Health) assessments and interventions performed: Yes, no acute needs Identified    CCM Care Plan  Allergies  Allergen Reactions  . Vancomycin Rash    Outpatient Encounter Medications as of 10/16/2020  Medication Sig  . allopurinol (ZYLOPRIM) 100 MG tablet Take 100 mg by mouth daily.  Marland Kitchen amoxicillin-clavulanate (AUGMENTIN) 875-125 MG tablet Take 1 tablet by mouth every 12 (twelve) hours.  Marland Kitchen aspirin EC 81 MG tablet Take 81 mg by mouth daily. Swallow whole.  . Biotin 5000 MCG CAPS Take 5,000 mcg by mouth daily.  [name] Apply between toes as needed for maceration.  . clopidogrel (PLAVIX) 75 MG tablet Take 1 tablet (75 mg total) by mouth daily.  . colchicine 0.6 MG tablet Take 0.6 mg by mouth daily as needed (gout flare up).  . Continuous Blood Gluc Sensor (FREESTYLE LIBRE 14 DAY SENSOR) MISC Use as directed to  check blood sugars  . diltiazem (CARDIZEM CD) 300 MG 24 hr capsule Take 300 mg by mouth daily.  . dorzolamide (TRUSOPT) 2 % ophthalmic solution Place 1 drop into both eyes at bedtime.  Marland Kitchen doxycycline (VIBRA-TABS) 100 MG tablet Take 1 tablet (100 mg total) by mouth every 12 (twelve) hours.  . ferrous sulfate 325 (65 FE) MG tablet Take 325 mg by mouth every Monday, Wednesday, and Friday.  . fish oil-omega-3 fatty acids 1000 MG capsule Take 2 g by mouth daily.  . furosemide (LASIX) 40 MG tablet Take 2 tabs in morning and 1 tab evening  . gabapentin (NEURONTIN) 100 MG capsule Take 2 capsules in am 1 capsule mid day and 1 capsule in evening  . glucose blood (FREESTYLE PRECISION NEO TEST) test strip Use as instructed  . HYDROcodone-acetaminophen (NORCO/VICODIN) 5-325 MG tablet Take 1 tablet by mouth every 6 (six) hours as needed for moderate pain.  Marland Kitchen latanoprost (XALATAN) 0.005 % ophthalmic solution Place 1 drop into both eyes at bedtime.   . Magnesium 250 MG TABS Take 1 tablet by mouth daily.  . Multiple Vitamin (MULTIVITAMIN WITH MINERALS) TABS tablet Take 1 tablet by mouth daily.  . Multiple Vitamins-Minerals (PRESERVISION AREDS PO) Take 1 tablet by mouth daily.  . pantoprazole (PROTONIX) 40 MG tablet Take 1 tablet (40 mg total) by mouth daily.  . Polyvinyl Alcohol-Povidone (REFRESH OP) Place 1 drop into both eyes daily as needed (dry eyes).  . rosuvastatin (CRESTOR) 10 MG tablet Take 1 tablet (10 mg total) by mouth daily. (Patient taking differently: Take 10 mg by  mouth every 7 (seven) days. Wednesday of each week)  . Semaglutide, [name], (OZEMPIC, [name],) 2 MG/1.5ML SOPN Inject 1 mg into the skin once a week. (Patient taking differently: Inject 1 mg into the skin every Wednesday.)  . tamsulosin (FLOMAX) 0.4 MG CAPS capsule Take 0.4 mg by mouth daily.   No facility-administered encounter medications on file as of 10/16/2020.    Patient Active Problem List   Diagnosis Date Noted  . Left  hallux osteomyelitis ([place])   . Left ankle swelling   . Diabetic foot infection ([place])   . Cerebral thrombosis with cerebral infarction 07/20/2020  . Thrombocytopenia ([place]) 07/19/2020  . Cerebral ischemia 07/18/2020  . Bilateral hearing loss 05/20/2020  . Does use hearing aid 05/20/2020  . Diabetic ulcer of left great toe ([place]) 01/16/2020  . Controlled type 2 diabetes mellitus with stable proliferative retinopathy of both eyes, with long-term current use of insulin ([place]) 01/06/2020  . Intermediate stage nonexudative age-related macular degeneration of both eyes 01/06/2020  . Left epiretinal membrane 01/06/2020  . [place] of right macula 01/06/2020  . AKI (acute kidney injury) ([place]) 11/02/2019  . Cellulitis 11/02/2019  . Elevated TSH 07/10/2018  . Nephropathy 02/28/2018  . Disturbance of skin sensation 02/28/2018  . PAD (peripheral artery disease) ([place]) 07/17/2017  . 110.1 10/21/2013  . Epiphora 04/24/2013  . Laxity of eyelid 04/24/2013  . Anemia 04/09/2013  . History of peripheral vascular disease 04/09/2013  . History of stroke 04/09/2013  . S/P unilateral BKA (below knee amputation) ([place]) 03/17/2013  . Diabetes mellitus type 2 in obese ([place]) 03/17/2013  . Mixed hyperlipidemia 03/17/2013  . Essential hypertension 03/17/2013  . Gout 03/17/2013  . CKD (chronic kidney disease), stage III ([place]) 03/17/2013  . OSA on CPAP 03/17/2013  . Anemia, iron deficiency 03/17/2013  . Diverticulosis of colon 03/17/2013  . RBBB 03/17/2013  . Punctal stenosis, acquired 10/02/2012    Conditions to be addressed/monitored:PAD, Cellulitis of left lower extremity, Neuropathy, Mixed hyperlipidemia, Type 2 diabetes mellitus   Care Plan : Cellulitis of Left Great Toe  Updates made by Lynne Logan, RN since 10/19/2020 12:00 AM    Problem: Status post amputation of great toe, left   Priority: High    Long-Range Goal: Status post amputation of great toe, left   Start Date: 09/17/2020  Expected End  Date: 03/19/2021  Recent Progress: On track  Priority: High  Note:   Current Barriers:   Ineffective Self Health Maintenance   Unable to perform ADLs independently  Unable to perform IADLs independently  Status post amputation of great toe, left Clinical Goal(s):  Marland Kitchen Collaboration with Micheal Brine, [name] regarding development and update of comprehensive plan of care as evidenced by provider attestation and co-signature . Inter-disciplinary care team collaboration (see longitudinal plan of care)  patient will work with care management team to address care coordination and chronic disease management needs related to Disease Management  Educational Needs  Care Coordination  Medication Management and Education  Psychosocial Support   Interventions:  10/16/20 completed call with patient   Evaluation of current treatment plan related to  PAD/Status post amputation of great toe, left , self-management and patient's adherence to plan as established by provider.  Collaboration with Micheal Brine, [name] regarding development and update of comprehensive plan of care as evidenced by provider attestation       and co-signature  Inter-disciplinary care team collaboration (see longitudinal plan of care)  Determined patient is s/p left great toe amputation  and is home with daughter available to assist him as needed  Confirmed patient continues to receive SN[facility] via [facility]  Confirmed patient has all medications on hand at home and verbalizes understanding his prescribed regimen  Reviewed s/s suggestive of infection and or when to contact the doctor is needed  Reviewed and discussed 2 week post op follow appointment scheduled on 10/19/20 $RemoveB'@8'AMoDSTuX$ :15 AM with Dr. Sherryle Lis . Reviewed medications with patient and discussed importance of medication adherence  Determined patient completed a post op visit with Dr. Sherryle Lis on 10/05/20 with the following Assessment/Plan reviewed:  Marland Kitchen Assessment:   . No  diagnosis found. . Plan: .  Patient was evaluated and treated and all questions answered. Marland Kitchen Ulcer left foot . -Unfortunately his wound appears to have dehisced and has not completely healed.  Hopefully will heal by secondary intention at this point.  No signs of infection.  No exposed bone or tendon or joint. . -Debridement as below. . -Dressed with Iodosorb, DSD. . -Continue off-loading with surgical shoe. . -Continue 3 times weekly changes go to daily if necessary for drainage . -Iodosorb at home . -Refill antibiotics for now . Procedure: Excisional Debridement of Wound . Rationale: Removal of non-viable soft tissue from the wound to promote healing.  . Anesthesia: none . Post-Debridement Wound Measurements: 3 cm x 2 cm x 0.2 cm . Type of Debridement: Sharp Excisional . Tissue Removed: Non-viable soft tissue . Depth of Debridement: subcutaneous tissue. . Technique: Sharp excisional debridement to bleeding, viable wound base.  . Dressing: Dry, sterile, compression dressing. . Disposition: Patient tolerated procedure well.    . Return in about 2 weeks (around 10/19/2020) for wound care. Self Care Activities:  . Continue to keep all scheduled follow up appointments . Take medications as directed  . Let your healthcare team know if you are unable to take your medications . Call your pharmacy for refills at least 7 days prior to running out of medication . Keep you post op follow up appointment with Dr. Sherryle Lis as scheduled for 10/19/20 $RemoveBe'@8'bBcKOPXVg$ :15 AM . Call your surgeon if you develop s/s suggestive of infection or experiencing drainage, bleeding or increased pain to your surgical wound  Patient Goals: - to recover from my toe amputation  Follow Up Plan: Telephone follow up appointment with care management team member scheduled for: 01/04/21    Care Plan : Chronic Kidney (Adult)  Updates made by Lynne Logan, RN since 10/19/2020 12:00 AM    Problem: Disease Progression   Priority: High     Long-Range Goal: Disease Progression Prevented or Minimized   Start Date: 10/16/2020  Expected End Date: 10/15/2021  This Visit's Progress: On track  Priority: High  Note:   Current Barriers:   Ineffective Self Health Maintenance   Needs assistance with ADLs independently  Needs assistance with IADLs independently Clinical Goal(s):  Marland Kitchen Collaboration with Micheal Brine, [name] regarding development and update of comprehensive plan of care as evidenced by provider attestation and co-signature . Inter-disciplinary care team collaboration (see longitudinal plan of care)  patient will work with care management team to address care coordination and chronic disease management needs related to Disease Management  Educational Needs  Care Coordination  Medication Management and Education  Psychosocial Support   Interventions:   Evaluation of current treatment plan related to CKD Stage III , self-management and patient's adherence to plan as established by provider.  Collaboration with Micheal Brine, [name] regarding development and update of comprehensive plan of care  as evidenced by provider attestation       and co-signature  Inter-disciplinary care team collaboration (see longitudinal plan of care) . Provided education to patient about basic disease process related to CKD . Review of patient status, including review of consultants reports, relevant laboratory and other test results, and medications completed. . Reviewed medications with patient and discussed importance of medication adherence . Educated patient on dietary recommendations and increasing water intake to 64 oz daily unless otherwise directed . Instructed patient to keep all scheduled follow up appointments with Nephrology as directed for close monitoring of kidney function  . Mailed printed educational materials related to Kidney disease  Discussed plans with patient for ongoing care management follow up and provided patient  with direct contact information for care management team Self Care Activities:  . Continue to adhere to MD recommendations for CKD  . Continue to keep all scheduled follow up appointments . Take medications as directed  . Let your healthcare team know if you are unable to take your medications . Call your pharmacy for refills at least 7 days prior to running out of medication . Increase your water intake unless otherwise directed . Review mailed printed educational materials related to Kidney disease Patient Goals: - maintain adequate kidney function   Follow Up Plan: Telephone follow up appointment with care management team member scheduled for: 01/04/21    Plan:Telephone follow up appointment with care management team member scheduled for:  01/04/21  Barb Merino, RN, BSN, CCM Care Management Coordinator Lead Management/[facility]  Direct Phone: [phone]

## 2020-10-19 NOTE — Patient Instructions (Signed)
Goals Addressed    . Follow My Treatment Plan-Chronic Kidney   On track    Timeframe:  Long-Range Goal Priority:  High Start Date:  10/16/20                           Expected End Date:  10/16/21                     Follow Up Date: 01/04/21    Self Care Activities:  . Continue to adhere to MD recommendations for CKD  . Continue to keep all scheduled follow up appointments . Take medications as directed  . Let your healthcare team know if you are unable to take your medications . Call your pharmacy for refills at least 7 days prior to running out of medication . Increase your water intake unless otherwise directed . Review mailed printed educational materials related to Kidney disease Patient Goals: - maintain adequate kidney function    Why is this important?    Staying as healthy as you can is very important. This may mean making changes if you smoke, don't exercise or eat poorly.   A healthy lifestyle is an important goal for you.   Following the treatment plan and making changes may be hard.   Try some of these steps to help keep the disease from getting worse.     Notes:     . Wound health s/p amputation to left great toe   On track    Timeframe:  Long-Range Goal Priority:  High Start Date:  09/17/20                           Expected End Date: 03/19/21  Next Scheduled Follow Up Date: 01/04/21   Self Care Activities:  . Continue to keep all scheduled follow up appointments . Take medications as directed  . Let your healthcare team know if you are unable to take your medications . Call your pharmacy for refills at least 7 days prior to running out of medication . Keep you post op follow up appointment with Dr. Sherryle Lis as scheduled for 10/19/20 $RemoveBe'@8'miCMSHeDV$ :15 am for 2 week post f/u  . Call your surgeon if you develop s/s suggestive of infection or experiencing drainage, bleeding or increased pain to your surgical wound  Patient Goals: - to recover from my toe amputation

## 2020-10-19 NOTE — Progress Notes (Signed)
  Subjective:  Patient ID: Micheal Walls, male    DOB: 1935/10/22,  MRN: [id number]  Chief Complaint  Patient presents with  . Wound Check    Denies f/c/n/v. Denies any concerns. Dried blood on wrappings.     DOS: 09/14/2020 Procedure: Left hallux amputation  85 y.o. male returns for post-op check.  They have been changing the dressing 3 times weekly at home  Review of Systems: Negative except as noted in the HPI. Denies N/V/F/Ch.   Objective:  There were no vitals filed for this visit. There is no height or weight on file to calculate BMI. Constitutional Well developed. Well nourished.  Vascular Foot warm and well perfused. Capillary refill normal to all digits.   Neurologic Normal speech. Oriented to person, place, and time. Epicritic sensation to light touch grossly present bilaterally.  Dermatologic  amputation site is open ulceration measuring 3 cm x 1.5 cm x 0.4 cm  Orthopedic:  Minimal tenderness to palpation noted about the surgical site.        Assessment:   1. Ulcer of left foot with fat layer exposed ([place])   2. Diabetic peripheral neuropathy associated with type 2 diabetes mellitus ([place])   3. Peripheral arterial disease ([place])   4. Type II diabetes mellitus with peripheral circulatory disorder [facility])    Plan:  Patient was evaluated and treated and all questions answered.  Ulcer left foot -We will submit for preauthorization for advanced wound care products including grafting if necessary as it has not healed within 1 month of surgery. -Debridement as below. -Dressed with Iodosorb, DSD. -Continue off-loading with surgical shoe. -Continue 3 times weekly changes go to daily if necessary for drainage -Iodosorb at home   Procedure: Excisional Debridement of Wound Rationale: Removal of non-viable soft tissue from the wound to promote healing.  Anesthesia: none  Post-Debridement Wound Measurements: 3.0 x 1.5 x 0.4 cm Type of Debridement: Sharp  Excisional Tissue Removed: Non-viable soft tissue Depth of Debridement: subcutaneous tissue. Technique: Sharp excisional debridement to bleeding, viable wound base.  Dressing: Dry, sterile, compression dressing. Disposition: Patient tolerated procedure well.      Return in about 3 weeks (around 11/09/2020) for wound care, possible grafting.

## 2020-10-20 ENCOUNTER — Telehealth: Payer: Self-pay

## 2020-10-20 NOTE — Progress Notes (Signed)
Called patient for an appointment reminder with [name], [place] on 10-21-2020 at 10:00. Instructed patient to have all meds/supplements and logs available for review. Patient voiced understanding.  [place]  [phone]

## 2020-10-21 ENCOUNTER — Telehealth: Payer: Self-pay

## 2020-10-21 ENCOUNTER — Telehealth: Payer: Self-pay | Admitting: *Deleted

## 2020-10-21 ENCOUNTER — Emergency Department (HOSPITAL_COMMUNITY): Payer: Medicare Other

## 2020-10-21 ENCOUNTER — Encounter (HOSPITAL_COMMUNITY): Payer: Self-pay | Admitting: Emergency Medicine

## 2020-10-21 ENCOUNTER — Other Ambulatory Visit: Payer: Self-pay

## 2020-10-21 ENCOUNTER — Inpatient Hospital Stay (HOSPITAL_COMMUNITY)
Admission: EM | Admit: 2020-10-21 | Discharge: 2020-10-24 | DRG: 872 | Disposition: A | Discharge status: Home Health | Payer: Medicare Other | Attending: Student | Admitting: Student

## 2020-10-21 ENCOUNTER — Ambulatory Visit: Payer: Medicare Other

## 2020-10-21 DIAGNOSIS — Z89412 Acquired absence of left great toe: Secondary | ICD-10-CM

## 2020-10-21 DIAGNOSIS — Z4781 Encounter for orthopedic aftercare following surgical amputation: Secondary | ICD-10-CM | POA: Diagnosis not present

## 2020-10-21 DIAGNOSIS — Z794 Long term (current) use of insulin: Secondary | ICD-10-CM | POA: Diagnosis not present

## 2020-10-21 DIAGNOSIS — Z4801 Encounter for change or removal of surgical wound dressing: Secondary | ICD-10-CM | POA: Diagnosis not present

## 2020-10-21 DIAGNOSIS — Z1629 Resistance to other single specified antibiotic: Secondary | ICD-10-CM | POA: Diagnosis present

## 2020-10-21 DIAGNOSIS — Z20822 Contact with and (suspected) exposure to covid-19: Secondary | ICD-10-CM | POA: Diagnosis not present

## 2020-10-21 DIAGNOSIS — E782 Mixed hyperlipidemia: Secondary | ICD-10-CM | POA: Diagnosis not present

## 2020-10-21 DIAGNOSIS — Y835 Amputation of limb(s) as the cause of abnormal reaction of the patient, or of later complication, without mention of misadventure at the time of the procedure: Secondary | ICD-10-CM | POA: Diagnosis present

## 2020-10-21 DIAGNOSIS — E1159 Type 2 diabetes mellitus with other circulatory complications: Secondary | ICD-10-CM | POA: Diagnosis not present

## 2020-10-21 DIAGNOSIS — L03116 Cellulitis of left lower limb: Secondary | ICD-10-CM

## 2020-10-21 DIAGNOSIS — R21 Rash and other nonspecific skin eruption: Secondary | ICD-10-CM | POA: Diagnosis not present

## 2020-10-21 DIAGNOSIS — G4733 Obstructive sleep apnea (adult) (pediatric): Secondary | ICD-10-CM | POA: Diagnosis present

## 2020-10-21 DIAGNOSIS — M7989 Other specified soft tissue disorders: Secondary | ICD-10-CM | POA: Diagnosis not present

## 2020-10-21 DIAGNOSIS — N183 Chronic kidney disease, stage 3 unspecified: Secondary | ICD-10-CM | POA: Diagnosis present

## 2020-10-21 DIAGNOSIS — I5032 Chronic diastolic (congestive) heart failure: Secondary | ICD-10-CM | POA: Diagnosis present

## 2020-10-21 DIAGNOSIS — I130 Hypertensive heart and chronic kidney disease with heart failure and stage 1 through stage 4 chronic kidney disease, or unspecified chronic kidney disease: Secondary | ICD-10-CM | POA: Diagnosis not present

## 2020-10-21 DIAGNOSIS — G629 Polyneuropathy, unspecified: Secondary | ICD-10-CM | POA: Diagnosis present

## 2020-10-21 DIAGNOSIS — Z87891 Personal history of nicotine dependence: Secondary | ICD-10-CM

## 2020-10-21 DIAGNOSIS — I69398 Other sequelae of cerebral infarction: Secondary | ICD-10-CM | POA: Diagnosis not present

## 2020-10-21 DIAGNOSIS — E1122 Type 2 diabetes mellitus with diabetic chronic kidney disease: Secondary | ICD-10-CM | POA: Diagnosis present

## 2020-10-21 DIAGNOSIS — N179 Acute kidney failure, unspecified: Secondary | ICD-10-CM

## 2020-10-21 DIAGNOSIS — I10 Essential (primary) hypertension: Secondary | ICD-10-CM | POA: Diagnosis present

## 2020-10-21 DIAGNOSIS — T8744 Infection of amputation stump, left lower extremity: Secondary | ICD-10-CM | POA: Diagnosis not present

## 2020-10-21 DIAGNOSIS — Z974 Presence of external hearing-aid: Secondary | ICD-10-CM

## 2020-10-21 DIAGNOSIS — R6510 Systemic inflammatory response syndrome (SIRS) of non-infectious origin without acute organ dysfunction: Secondary | ICD-10-CM

## 2020-10-21 DIAGNOSIS — E113553 Type 2 diabetes mellitus with stable proliferative diabetic retinopathy, bilateral: Secondary | ICD-10-CM | POA: Diagnosis not present

## 2020-10-21 DIAGNOSIS — I4510 Unspecified right bundle-branch block: Secondary | ICD-10-CM | POA: Diagnosis present

## 2020-10-21 DIAGNOSIS — Z881 Allergy status to other antibiotic agents status: Secondary | ICD-10-CM

## 2020-10-21 DIAGNOSIS — Z89511 Acquired absence of right leg below knee: Secondary | ICD-10-CM

## 2020-10-21 DIAGNOSIS — Z79899 Other long term (current) drug therapy: Secondary | ICD-10-CM

## 2020-10-21 DIAGNOSIS — Z7902 Long term (current) use of antithrombotics/antiplatelets: Secondary | ICD-10-CM

## 2020-10-21 DIAGNOSIS — J9811 Atelectasis: Secondary | ICD-10-CM | POA: Diagnosis not present

## 2020-10-21 DIAGNOSIS — R6520 Severe sepsis without septic shock: Secondary | ICD-10-CM | POA: Diagnosis present

## 2020-10-21 DIAGNOSIS — L0390 Cellulitis, unspecified: Secondary | ICD-10-CM | POA: Diagnosis not present

## 2020-10-21 DIAGNOSIS — N1832 Chronic kidney disease, stage 3b: Secondary | ICD-10-CM | POA: Diagnosis present

## 2020-10-21 DIAGNOSIS — Z833 Family history of diabetes mellitus: Secondary | ICD-10-CM

## 2020-10-21 DIAGNOSIS — Z7982 Long term (current) use of aspirin: Secondary | ICD-10-CM

## 2020-10-21 DIAGNOSIS — E1169 Type 2 diabetes mellitus with other specified complication: Secondary | ICD-10-CM | POA: Diagnosis not present

## 2020-10-21 DIAGNOSIS — M869 Osteomyelitis, unspecified: Secondary | ICD-10-CM | POA: Diagnosis not present

## 2020-10-21 DIAGNOSIS — A419 Sepsis, unspecified organism: Secondary | ICD-10-CM | POA: Diagnosis not present

## 2020-10-21 DIAGNOSIS — M109 Gout, unspecified: Secondary | ICD-10-CM | POA: Diagnosis present

## 2020-10-21 DIAGNOSIS — E785 Hyperlipidemia, unspecified: Secondary | ICD-10-CM | POA: Diagnosis present

## 2020-10-21 DIAGNOSIS — R600 Localized edema: Secondary | ICD-10-CM | POA: Diagnosis not present

## 2020-10-21 DIAGNOSIS — E669 Obesity, unspecified: Secondary | ICD-10-CM | POA: Diagnosis present

## 2020-10-21 DIAGNOSIS — Z7984 Long term (current) use of oral hypoglycemic drugs: Secondary | ICD-10-CM

## 2020-10-21 DIAGNOSIS — Z8249 Family history of ischemic heart disease and other diseases of the circulatory system: Secondary | ICD-10-CM

## 2020-10-21 DIAGNOSIS — Z6835 Body mass index (BMI) 35.0-35.9, adult: Secondary | ICD-10-CM

## 2020-10-21 DIAGNOSIS — H353132 Nonexudative age-related macular degeneration, bilateral, intermediate dry stage: Secondary | ICD-10-CM | POA: Diagnosis present

## 2020-10-21 DIAGNOSIS — E113591 Type 2 diabetes mellitus with proliferative diabetic retinopathy without macular edema, right eye: Secondary | ICD-10-CM

## 2020-10-21 DIAGNOSIS — E1151 Type 2 diabetes mellitus with diabetic peripheral angiopathy without gangrene: Secondary | ICD-10-CM | POA: Diagnosis present

## 2020-10-21 DIAGNOSIS — N400 Enlarged prostate without lower urinary tract symptoms: Secondary | ICD-10-CM | POA: Diagnosis present

## 2020-10-21 DIAGNOSIS — D631 Anemia in chronic kidney disease: Secondary | ICD-10-CM | POA: Diagnosis present

## 2020-10-21 LAB — CBC WITH DIFFERENTIAL/PLATELET
Abs Immature Granulocytes: 0.09 10*3/uL — ABNORMAL HIGH (ref 0.00–0.07)
Basophils Absolute: 0 10*3/uL (ref 0.0–0.1)
Basophils Relative: 0 %
Eosinophils Absolute: 0 10*3/uL (ref 0.0–0.5)
Eosinophils Relative: 0 %
HCT: 31.4 % — ABNORMAL LOW (ref 39.0–52.0)
Hemoglobin: 9.7 g/dL — ABNORMAL LOW (ref 13.0–17.0)
Immature Granulocytes: 1 %
Lymphocytes Relative: 7 %
Lymphs Abs: 1.2 10*3/uL (ref 0.7–4.0)
MCH: 29 pg (ref 26.0–34.0)
MCHC: 30.9 g/dL (ref 30.0–36.0)
MCV: 93.7 fL (ref 80.0–100.0)
Monocytes Absolute: 0.8 10*3/uL (ref 0.1–1.0)
Monocytes Relative: 5 %
Neutro Abs: 15.6 10*3/uL — ABNORMAL HIGH (ref 1.7–7.7)
Neutrophils Relative %: 87 %
Platelets: 220 10*3/uL (ref 150–400)
RBC: 3.35 MIL/uL — ABNORMAL LOW (ref 4.22–5.81)
RDW: 15.8 % — ABNORMAL HIGH (ref 11.5–15.5)
WBC: 17.8 10*3/uL — ABNORMAL HIGH (ref 4.0–10.5)
nRBC: 0 % (ref 0.0–0.2)

## 2020-10-21 LAB — COMPREHENSIVE METABOLIC PANEL
ALT: 22 U/L (ref 0–44)
AST: 21 U/L (ref 15–41)
Albumin: 3.3 g/dL — ABNORMAL LOW (ref 3.5–5.0)
Alkaline Phosphatase: 68 U/L (ref 38–126)
Anion gap: 8 (ref 5–15)
BUN: 69 mg/dL — ABNORMAL HIGH (ref 8–23)
CO2: 27 mmol/L (ref 22–32)
Calcium: 9.8 mg/dL (ref 8.9–10.3)
Chloride: 99 mmol/L (ref 98–111)
Creatinine, Ser: 2.27 mg/dL — ABNORMAL HIGH (ref 0.61–1.24)
GFR, Estimated: 28 mL/min — ABNORMAL LOW (ref >60)
Glucose, Bld: 129 mg/dL — ABNORMAL HIGH (ref 70–99)
Potassium: 4.5 mmol/L (ref 3.5–5.1)
Sodium: 134 mmol/L — ABNORMAL LOW (ref 135–145)
Total Bilirubin: 0.4 mg/dL (ref 0.3–1.2)
Total Protein: 7.6 g/dL (ref 6.5–8.1)

## 2020-10-21 LAB — PROTIME-INR
INR: 1.1 (ref 0.8–1.2)
Prothrombin Time: 14.5 seconds (ref 11.4–15.2)

## 2020-10-21 LAB — C-REACTIVE PROTEIN: CRP: 16.8 mg/dL — ABNORMAL HIGH (ref <1.0)

## 2020-10-21 LAB — MRSA PCR SCREENING: MRSA by PCR: NEGATIVE

## 2020-10-21 LAB — PREALBUMIN: Prealbumin: 18.5 mg/dL (ref 18–38)

## 2020-10-21 LAB — LACTIC ACID, PLASMA: Lactic Acid, Venous: 1.1 mmol/L (ref 0.5–1.9)

## 2020-10-21 IMAGING — DX DG FOOT 2V*L*
2 series · 2 of 2 positions shown · non-contrast
Comparison: Left foot radiograph dated [DATE].

CLINICAL DATA: 84-year-old male status post after a shin of the
great toe. Evaluate for possibility of osteomyelitis.

EXAM:
LEFT FOOT - 2 VIEW

[foot ap]
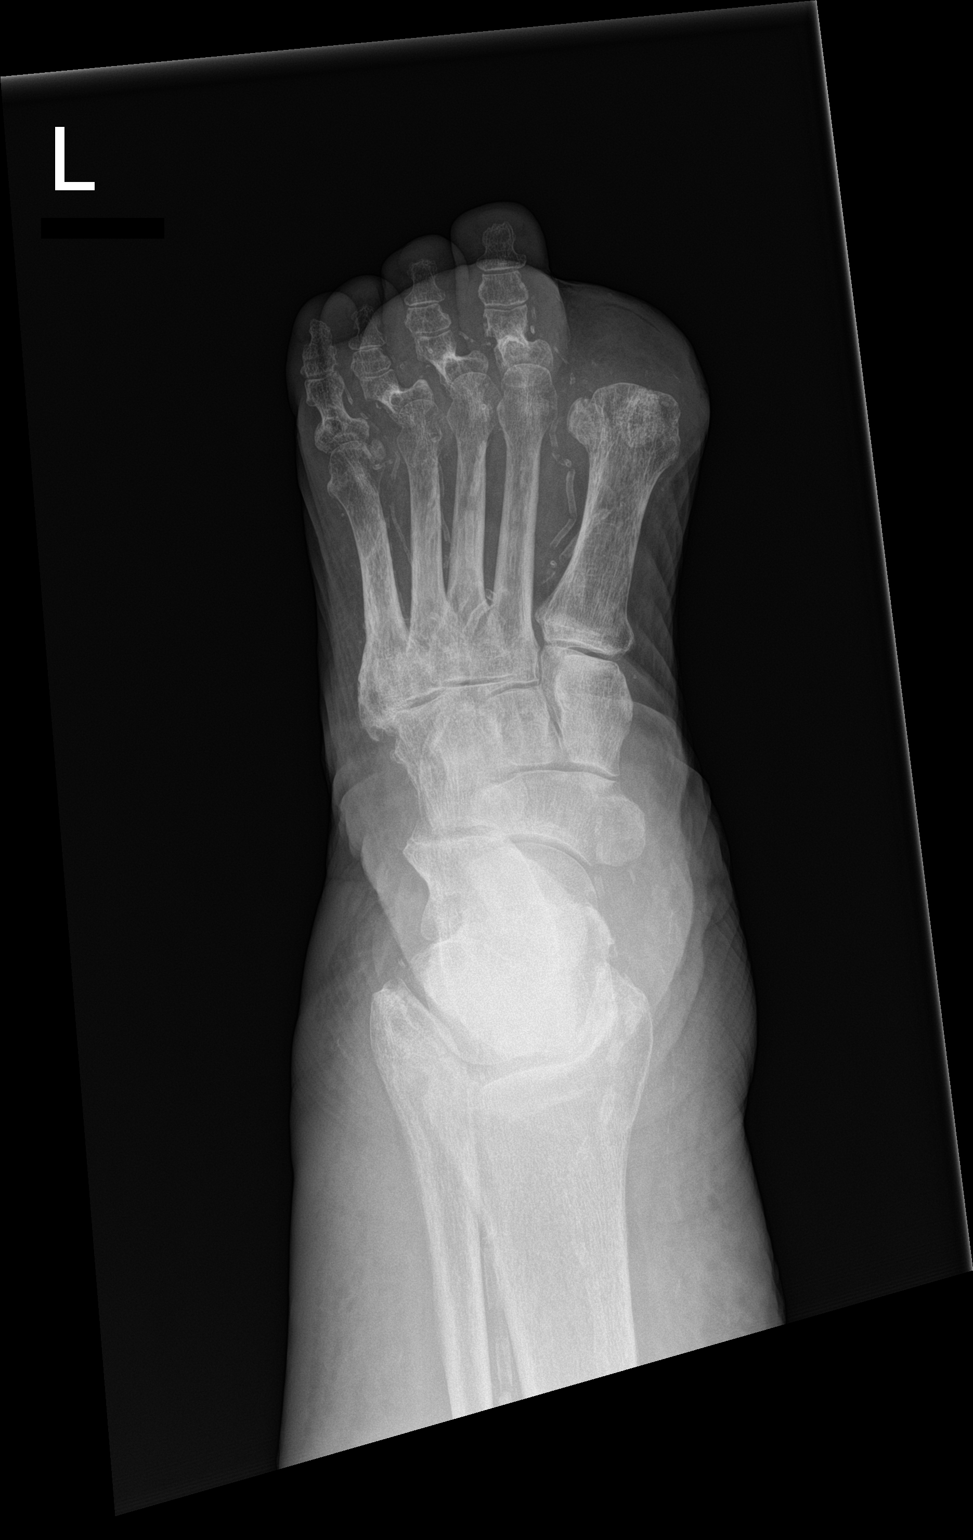

[foot lat]
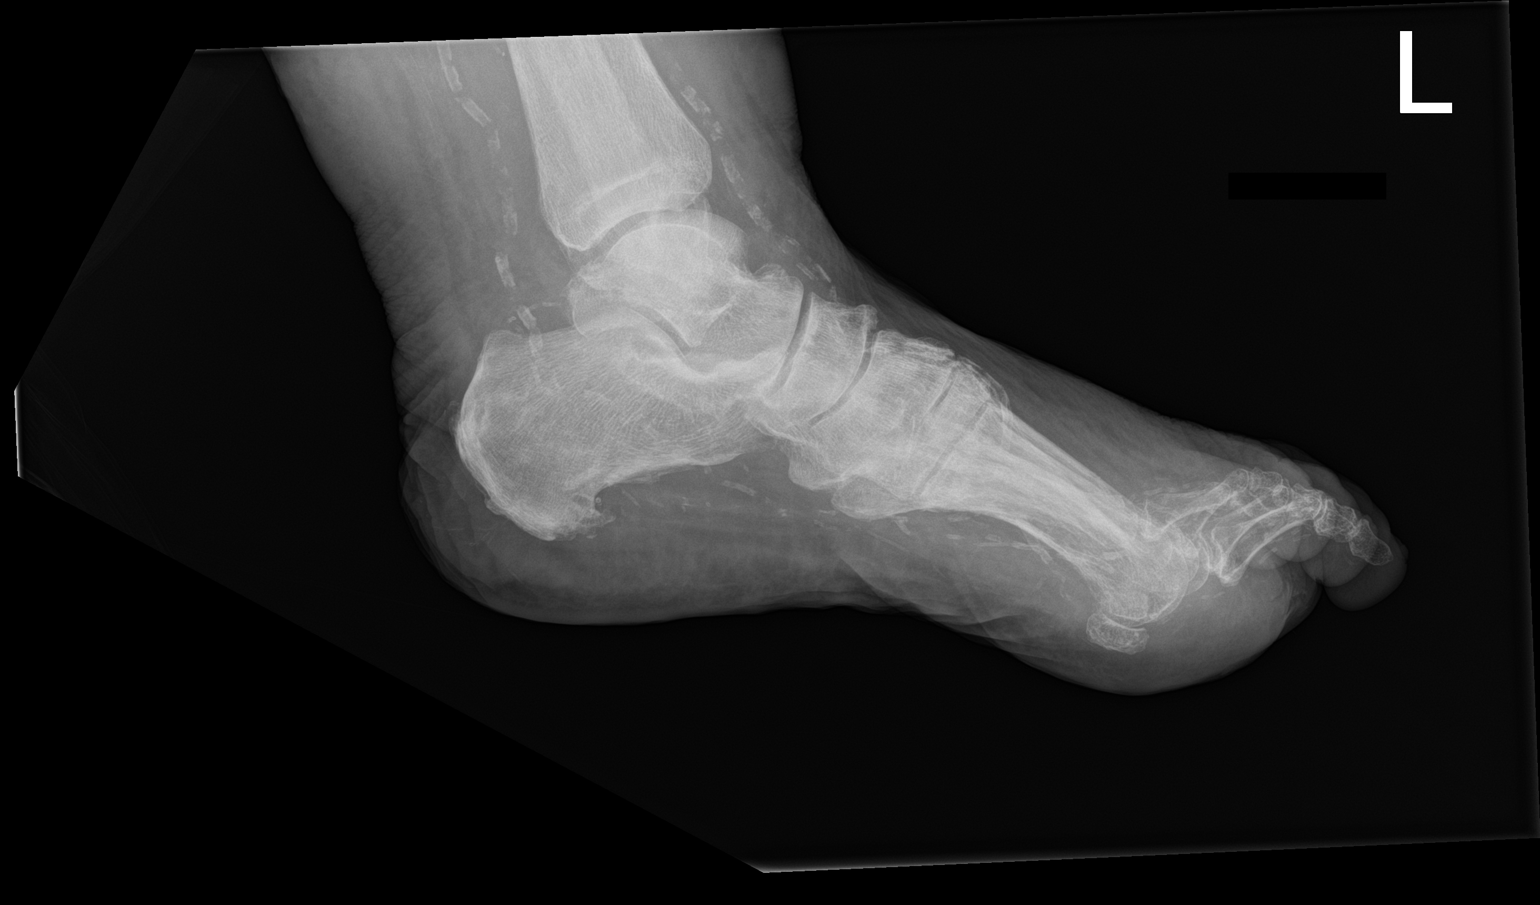

[2 of 2 positions shown; findings below may reference images not displayed]

FINDINGS: Status post prior amputation of the first MTP joint. There is no
acute fracture or dislocation. The bones are osteopenic. No bone
erosion or periosteal reaction to suggest acute osteomyelitis.
Vascular calcifications noted. Mild soft tissue swelling of the
forefoot and diffuse subcutaneous edema. No radiopaque foreign
object or soft tissue gas.
IMPRESSION: 1. Amputation of the first MTP joint. No acute fracture or
dislocation.
2. No radiographic findings of acute osteomyelitis.
3. Soft tissue swelling of the forefoot and diffuse subcutaneous
edema.

## 2020-10-21 IMAGING — CR DG CHEST 2V
2 series · 2 of 2 positions shown · non-contrast
Comparison: Chest radiograph dated [DATE].

CLINICAL DATA: 84-year-old male with sepsis.

EXAM:
CHEST - 2 VIEW

[w chest lat]
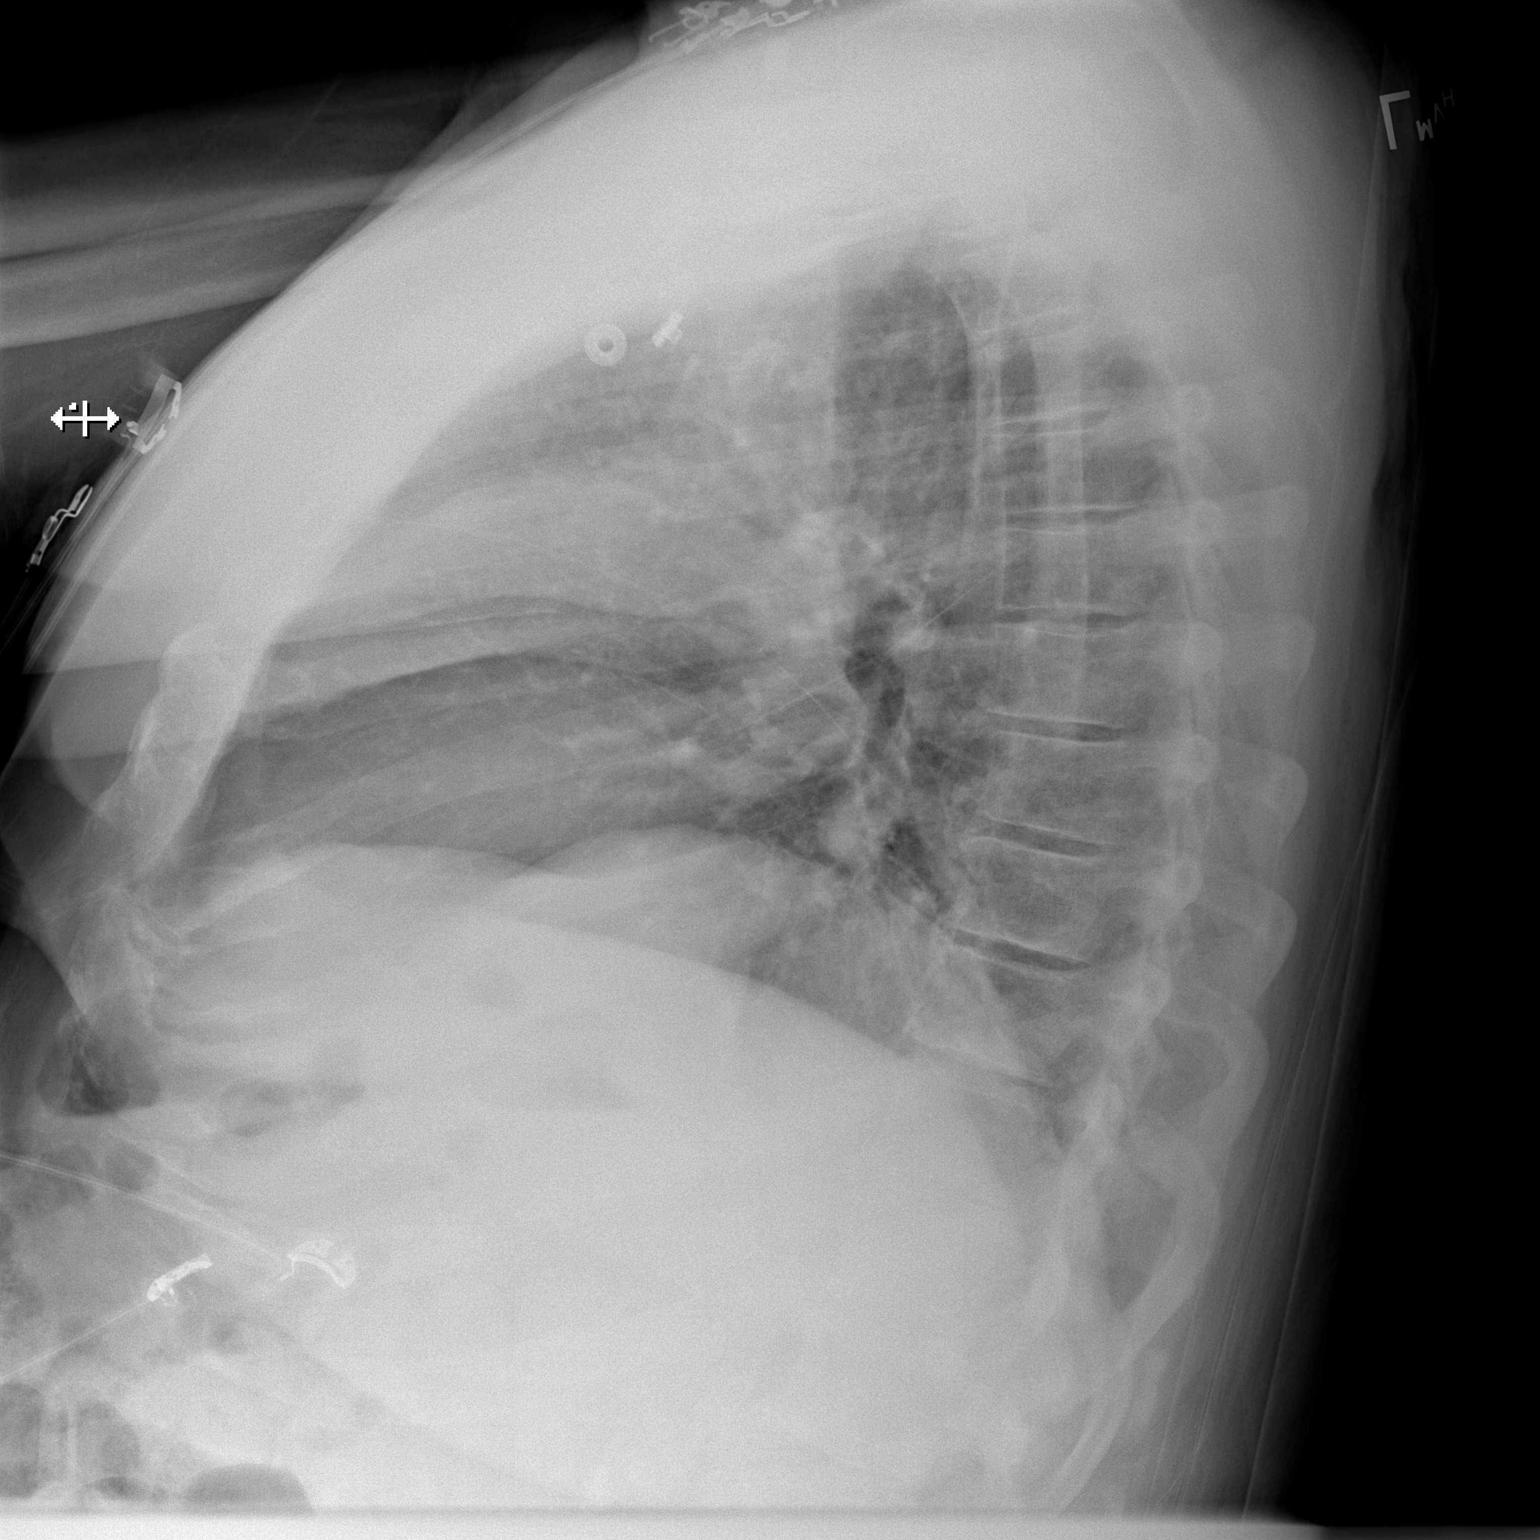

[x chest ap]
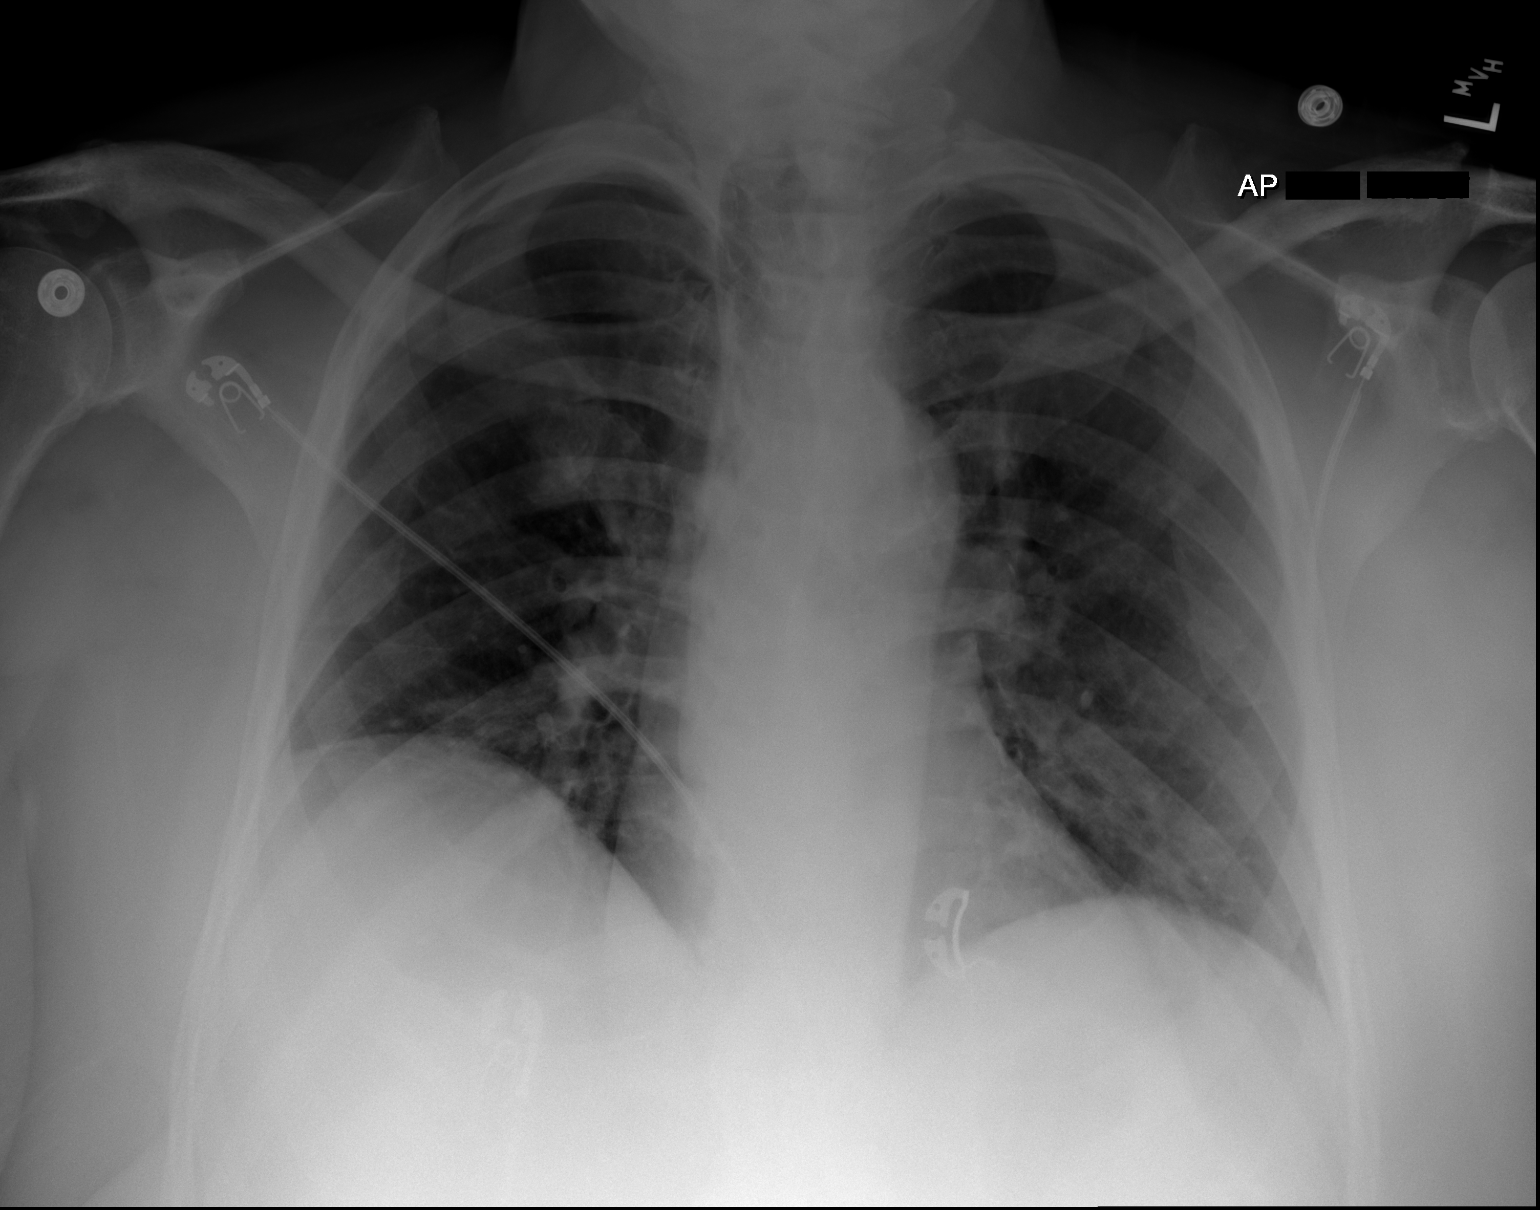

[2 of 2 positions shown; findings below may reference images not displayed]

FINDINGS: Small right lung base atelectasis. Infiltrate is less likely but not
excluded clinical correlation is recommended. There is no pleural
effusion pneumothorax. The cardiac silhouette is within limits. No
acute osseous pathology.
IMPRESSION: Small right lung base atelectasis versus less likely infiltrate.

## 2020-10-21 MED ORDER — SODIUM CHLORIDE 0.9 % IV SOLN
2.0000 g | Freq: Once | INTRAVENOUS | Status: AC
  Administered 2020-10-21: 2 g via INTRAVENOUS
  Filled 2020-10-21: qty 2

## 2020-10-21 MED ORDER — LORAZEPAM 2 MG/ML IJ SOLN: 0.5000 mg | INTRAMUSCULAR | Status: DC | PRN

## 2020-10-21 MED ORDER — LINEZOLID 600 MG/300ML IV SOLN
600.0000 mg | Freq: Two times a day (BID) | INTRAVENOUS | Status: DC
  Administered 2020-10-22 – 2020-10-24 (×5): 600 mg via INTRAVENOUS
  Filled 2020-10-21 (×5): qty 300

## 2020-10-21 MED ORDER — ONDANSETRON HCL 4 MG PO TABS: 4.0000 mg | ORAL_TABLET | Freq: Four times a day (QID) | ORAL | Status: DC | PRN

## 2020-10-21 MED ORDER — LINEZOLID 600 MG/300ML IV SOLN
600.0000 mg | Freq: Once | INTRAVENOUS | Status: AC
  Administered 2020-10-21: 600 mg via INTRAVENOUS
  Filled 2020-10-21: qty 300

## 2020-10-21 MED ORDER — ACETAMINOPHEN 650 MG RE SUPP: 650.0000 mg | Freq: Four times a day (QID) | RECTAL | Status: DC | PRN

## 2020-10-21 MED ORDER — ACETAMINOPHEN 325 MG PO TABS
650.0000 mg | ORAL_TABLET | Freq: Four times a day (QID) | ORAL | Status: DC | PRN
  Administered 2020-10-23: 650 mg via ORAL
  Filled 2020-10-21: qty 2

## 2020-10-21 MED ORDER — ONDANSETRON HCL 4 MG/2ML IJ SOLN: 4.0000 mg | Freq: Four times a day (QID) | INTRAMUSCULAR | Status: DC | PRN

## 2020-10-21 MED ORDER — SODIUM CHLORIDE 0.9 % IV SOLN
2.0000 g | INTRAVENOUS | Status: DC
  Administered 2020-10-22: 2 g via INTRAVENOUS
  Filled 2020-10-21: qty 20
  Filled 2020-10-21: qty 2

## 2020-10-21 MED ORDER — METRONIDAZOLE 500 MG PO TABS
500.0000 mg | ORAL_TABLET | Freq: Three times a day (TID) | ORAL | Status: DC
  Administered 2020-10-21 – 2020-10-23 (×5): 500 mg via ORAL
  Filled 2020-10-21 (×5): qty 1

## 2020-10-21 MED ORDER — ENOXAPARIN SODIUM 40 MG/0.4ML IJ SOSY
40.0000 mg | PREFILLED_SYRINGE | INTRAMUSCULAR | Status: DC
  Administered 2020-10-21 – 2020-10-23 (×3): 40 mg via SUBCUTANEOUS
  Filled 2020-10-21 (×3): qty 0.4

## 2020-10-21 NOTE — H&P (Signed)
History and Physical    ESTIBEN MIZUNO VEH:[id number] DOB: 08/25/1935 DOA: 10/21/2020  PCP: [name], [name]  Patient coming from: Home  I have personally briefly reviewed patient's old medical records in [place]  Chief Complaint: Infection of amputation site  HPI: Micheal Walls is a 85 y.o. male with medical history significant of DM2, PAD, R BKA.  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  Ultimately underwent amputation of L 1st and 2nd toes in April.  Since that time wound hasnt really been healing very well.  Home health RN sent pt in to ED today for concern for possible infection.  Symptoms are constant, nothing makes symptoms better or worse.  Pt denies CP, fever, chills, N/V.  No pain but has severe neuropathy in that foot.   ED Course: WBC 17.8k, Tm 99.3, HR 112.  Creat 2.2 up from 1.6 on DC in April.  EDP started cefepime + LZD (vanc allergy).   Review of Systems: As per HPI, otherwise all review of systems negative.  Past Medical History:  Diagnosis Date  . Acute metabolic encephalopathy 01/14/6628  . Amputated below knee ([place])    right  . Anemia   . Aspiration pneumonia ([place]) 11/02/2019  . Diastolic heart failure ([place])   . Diverticulosis   . DM (diabetes mellitus) ([place])   . Gout   . Hyperlipemia   . OSA on CPAP   . RBBB   . Sepsis ([place]) 11/02/2019  . Systemic hypertension     Past Surgical History:  Procedure Laterality Date  . ABDOMINAL AORTOGRAM W/LOWER EXTREMITY Left 08/11/2020   Procedure: ABDOMINAL AORTOGRAM W/LOWER EXTREMITY;  Surgeon: Serafina Mitchell, MD;  Location: [place] [facility];  Service: Cardiovascular;  Laterality: Left;  . AMPUTATION Left 09/14/2020   Procedure: AMPUTATION LEFT GREAT TOE;  Surgeon: [name], DPM;  Location: [place] [facility];  Service: Podiatry;  Laterality: Left;  . LEG AMPUTATION BELOW KNEE     right  . NM MYOCAR PERF WALL MOTION  09/18/2009    no ischemia  . PERIPHERAL VASCULAR BALLOON ANGIOPLASTY Left 08/11/2020   Procedure: PERIPHERAL VASCULAR BALLOON ANGIOPLASTY;  Surgeon: Serafina Mitchell, MD;  Location: [facility];  Service: Cardiovascular;  Laterality: Left;  AT     reports that he quit smoking about 19 years ago. His smoking use included cigars. He has never used smokeless tobacco. He reports that he does not drink alcohol and does not use drugs.  Allergies  Allergen Reactions  . Vancomycin Rash    Family History  Problem Relation Age of Onset  . Diabetes Mother   . Heart attack Father   . Cancer Sister      Prior to Admission medications   Medication Sig Start Date End Date Taking? Authorizing Provider  allopurinol (ZYLOPRIM) 100 MG tablet Take 100 mg by mouth daily.    [provider]  amoxicillin-clavulanate (AUGMENTIN) 875-125 MG tablet Take 1 tablet by mouth every 12 (twelve) hours. 10/05/20   [name], DPM  aspirin EC 81 MG tablet Take 81 mg by mouth daily. Swallow whole.    [provider]  Biotin 5000 MCG CAPS Take 5,000 mcg by mouth daily.    [provider]  [name] Apply between toes as needed for maceration. 08/08/20   [name], DPM  clopidogrel (PLAVIX) 75 MG tablet Take 1  tablet (75 mg total) by mouth daily. 10/15/20   [name], [name]  colchicine 0.6 MG tablet Take 0.6 mg by mouth daily as needed (gout flare up).    [provider]  Continuous Blood Gluc Sensor (FREESTYLE LIBRE 14 DAY SENSOR) MISC Use as directed to check blood sugars 05/13/20   [name], [name]  diltiazem (CARDIZEM CD) 300 MG 24 hr capsule Take 300 mg by mouth daily.    [provider]  dorzolamide (TRUSOPT) 2 % ophthalmic solution Place 1 drop into both eyes at bedtime.    [provider]  doxycycline (VIBRA-TABS) 100 MG tablet Take 1 tablet (100 mg total) by mouth every 12 (twelve) hours. 10/05/20   Edwin Cap, DPM   ferrous sulfate 325 (65 FE) MG tablet Take 325 mg by mouth every Monday, Wednesday, and Friday.    [provider]  fish oil-omega-3 fatty acids 1000 MG capsule Take 2 g by mouth daily.    [provider]  furosemide (LASIX) 40 MG tablet Take 2 tabs in morning and 1 tab evening 10/15/20   [name], [name]  gabapentin (NEURONTIN) 100 MG capsule Take 2 capsules in am 1 capsule mid day and 1 capsule in evening 10/15/20   [name], [name]  glucose blood (FREESTYLE PRECISION NEO TEST) test strip Use as instructed 07/30/19   [name], [name]  HYDROcodone-acetaminophen (NORCO/VICODIN) 5-325 MG tablet Take 1 tablet by mouth every 6 (six) hours as needed for moderate pain. 09/15/20 09/15/21  Erick Blinks, MD  latanoprost (XALATAN) 0.005 % ophthalmic solution Place 1 drop into both eyes at bedtime.  10/12/18   [provider]  Magnesium 250 MG TABS Take 1 tablet by mouth daily.    [provider]  Multiple Vitamin (MULTIVITAMIN WITH MINERALS) TABS tablet Take 1 tablet by mouth daily.    [provider]  Multiple Vitamins-Minerals (PRESERVISION AREDS PO) Take 1 tablet by mouth daily.    [provider]  pantoprazole (PROTONIX) 40 MG tablet Take 1 tablet (40 mg total) by mouth daily. 10/15/20   [name], [name]  Polyvinyl Alcohol-Povidone (REFRESH OP) Place 1 drop into both eyes daily as needed (dry eyes).    [provider]  rosuvastatin (CRESTOR) 10 MG tablet Take 1 tablet (10 mg total) by mouth daily. Patient taking differently: Take 10 mg by mouth every 7 (seven) days. Wednesday of each week 11/07/19   Osvaldo Shipper, MD  Semaglutide, [name], (OZEMPIC, [name],) 2 MG/1.5ML SOPN Inject 1 mg into the skin once a week. Patient taking differently: Inject 1 mg into the skin every Wednesday. 09/24/19   [name], [name]  tamsulosin (FLOMAX) 0.4 MG CAPS capsule Take 0.4 mg by mouth daily.    [provider]    Physical Exam: Vitals:    10/21/20 2030 10/21/20 2045 10/21/20 2100 10/21/20 2130  BP: 134/61 140/62 (!) 136/59 121/65  Pulse: (!) 106 (!) 112 (!) 109 (!) 106  Resp: 20 19 19  (!) 21  Temp:      TempSrc:      SpO2: 97% 99% 99% 97%  Weight:      Height:        Constitutional: NAD, calm, comfortable Eyes: PERRL, lids and conjunctivae normal ENMT: Mucous membranes are moist. Posterior pharynx clear of any exudate or lesions.Normal dentition.  Neck: normal, supple, no masses, no thyromegaly Respiratory: clear to auscultation bilaterally, no wheezing, no crackles. Normal respiratory effort. No accessory muscle use.  Cardiovascular: Regular rate  and rhythm, no murmurs / rubs / gallops. No extremity edema. 2+ pedal pulses. No carotid bruits.  Abdomen: no tenderness, no masses palpated. No hepatosplenomegaly. Bowel sounds positive.  Musculoskeletal: no clubbing / cyanosis. No joint deformity upper and lower extremities. Good ROM, no contractures. Normal muscle tone.  Skin:  Neurologic: CN 2-12 grossly intact. Sensation intact, DTR normal. Strength 5/5 in all 4.  Psychiatric: Normal judgment and insight. Alert and oriented x 3. Normal mood.    Labs on Admission: I have personally reviewed following labs and imaging studies  CBC: Recent Labs  Lab 10/21/20 1847  WBC 17.8*  NEUTROABS 15.6*  HGB 9.7*  HCT 31.4*  MCV 93.7  PLT 354   Basic Metabolic Panel: Recent Labs  Lab 10/21/20 1847  NA 134*  K 4.5  CL 99  CO2 27  GLUCOSE 129*  BUN 69*  CREATININE 2.27*  CALCIUM 9.8   GFR: Estimated Creatinine Clearance: 34.4 mL/min (A) (by C-G formula based on SCr of 2.27 mg/dL (H)). Liver Function Tests: Recent Labs  Lab 10/21/20 1847  AST 21  ALT 22  ALKPHOS 68  BILITOT 0.4  PROT 7.6  ALBUMIN 3.3*   No results for input(s): LIPASE, AMYLASE in the last 168 hours. No results for input(s): AMMONIA in the last 168 hours. Coagulation Profile: Recent Labs  Lab 10/21/20 1847  INR 1.1   Cardiac  Enzymes: No results for input(s): CKTOTAL, CKMB, CKMBINDEX, TROPONINI in the last 168 hours. BNP (last 3 results) No results for input(s): PROBNP in the last 8760 hours. HbA1C: No results for input(s): HGBA1C in the last 72 hours. CBG: No results for input(s): GLUCAP in the last 168 hours. Lipid Profile: No results for input(s): CHOL, HDL, LDLCALC, TRIG, CHOLHDL, LDLDIRECT in the last 72 hours. Thyroid Function Tests: No results for input(s): TSH, T4TOTAL, FREET4, T3FREE, THYROIDAB in the last 72 hours. Anemia Panel: No results for input(s): VITAMINB12, FOLATE, FERRITIN, TIBC, IRON, RETICCTPCT in the last 72 hours. Urine analysis:    Component Value Date/Time   COLORURINE YELLOW 08/09/2020 1159   APPEARANCEUR HAZY (A) 08/09/2020 1159   LABSPEC 1.012 08/09/2020 1159   PHURINE 5.0 08/09/2020 1159   GLUCOSEU NEGATIVE 08/09/2020 1159   HGBUR NEGATIVE 08/09/2020 1159   BILIRUBINUR negative 09/28/2020 1558   KETONESUR NEGATIVE 08/09/2020 1159   PROTEINUR Negative 09/28/2020 1558   PROTEINUR 30 (A) 08/09/2020 1159   UROBILINOGEN 0.2 09/28/2020 1558   UROBILINOGEN 0.2 01/08/2011 1558   NITRITE negative 09/28/2020 1558   NITRITE NEGATIVE 08/09/2020 1159   LEUKOCYTESUR Negative 09/28/2020 1558   LEUKOCYTESUR NEGATIVE 08/09/2020 1159    Radiological Exams on Admission: DG Chest 2 View  Result Date: 10/21/2020 CLINICAL DATA:  85 year old male with sepsis. EXAM: CHEST - 2 VIEW COMPARISON:  Chest radiograph dated 08/09/2020. FINDINGS: Small right lung base atelectasis. Infiltrate is less likely but not excluded clinical correlation is recommended. There is no pleural effusion pneumothorax. The cardiac silhouette is within limits. No acute osseous pathology. IMPRESSION: Small right lung base atelectasis versus less likely infiltrate. Electronically Signed   By: Anner Crete M.D.   On: 10/21/2020 19:31   DG Foot 2 Views Left  Result Date: 10/21/2020 CLINICAL DATA:  85 year old male  status post after a shin of the great toe. Evaluate for possibility of osteomyelitis. EXAM: LEFT FOOT - 2 VIEW COMPARISON:  Left foot radiograph dated 09/14/2020. FINDINGS: Status post prior amputation of the first MTP joint. There is no acute fracture or dislocation. The bones are  osteopenic. No bone erosion or periosteal reaction to suggest acute osteomyelitis. Vascular calcifications noted. Mild soft tissue swelling of the forefoot and diffuse subcutaneous edema. No radiopaque foreign object or soft tissue gas. IMPRESSION: 1. Amputation of the first MTP joint. No acute fracture or dislocation. 2. No radiographic findings of acute osteomyelitis. 3. Soft tissue swelling of the forefoot and diffuse subcutaneous edema. Electronically Signed   By: Anner Crete M.D.   On: 10/21/2020 19:58    EKG: Independently reviewed.  Assessment/Plan Principal Problem:   Infection of amputation stump of left lower extremity (HCC) Active Problems:   Essential hypertension   CKD (chronic kidney disease), stage III (HCC)   AKI (acute kidney injury) ([place])   Controlled type 2 diabetes mellitus with stable proliferative retinopathy of both eyes, with long-term current use of insulin ([place])    1. Infection of amputation stump of toes of LLE - 1. LE wound pathway 2. Empiric rocephin, flagyl, LZD for the moment for the limb threatening infection and non-healing wound (Vanc allergy) 1. MRSA PCR nares 2. But toe grew out S. Ludgenesis back in Dec that was oxacillin resistant (clinda intermediate), so hesitant to de escalate ABx in this limb threatening infection when we have reason to believe there is a resistant organism running around. 1. Tetracycline sensitive, but has certainly failed enough courses of doxycycline over past couple of months as outpt. 3. Call ID in AM 3. Call Podiatry in AM 2. AKI on CKD 3 - 1. Hold lasix 2. Med rec still pending but looks like he might be off ACEi now? 3. Repeat BMP in  AM 4. Strict intake and output 5. IVF: LR at 125 3. DM2 - 1. Continue ozempic 2. CBG checks AC/HS 4. HTN - 1. Cont cardizem  DVT prophylaxis: Lovenox Code Status: Full Family Communication: No family in room Disposition Plan: Home after treatment for foot infection Consults called: None, call ID + podiatry in AM. Admission status: Place in obs   Raphaella Larkin, [facility]  How to contact the [facility] Attending or Consulting provider [place] or covering provider during after hours [place], for this patient?  1. Check the care team in [facility] and look for a) attending/consulting [facility] provider listed and b) the [facility] team listed 2. Log into [email or web address]  Amion Physician Scheduling and messaging for groups and whole hospitals  On call and physician scheduling software for group practices, residents, hospitalists and other medical providers for call, clinic, rotation and shift schedules. On[employer] is a hospital-wide system for scheduling doctors and paging doctors on call. EasyPlot is for scientific plotting and data analysis.  [email or web address]  and use [facility]'s universal password to access. If you do not have the password, please contact the hospital operator.  3. Locate the [facility] provider you are looking for under [facility] and page to a number that you can be directly reached. 4. If you still have difficulty reaching the provider, please page the [facility] (Director on Call) for the Hospitalists listed on amion for assistance.  10/21/2020, 10:41 PM

## 2020-10-21 NOTE — Sepsis Progress Note (Deleted)
Following for sepsis monitoring ?

## 2020-10-21 NOTE — ED Triage Notes (Signed)
Pt arrived via EMS from home. Pt is complaining of cellulitis on his left lower extremity. Pt has had a recent amputation of his left toes and has antibiotics for his amputation. Pt hashx of right below the knee amputation. Pt has hx of diabetes.

## 2020-10-21 NOTE — Telephone Encounter (Signed)
Anissa w/ [facility] home health is calling because patient is having left lower leg 3 plus edema, redness and warm to touch, elevated heart rate, temp 98.6. left hallux amputation looks good.  He took 344ml milk of magnesia last night and has loose stools as well. Still taking Amoxicillin and Doxycycline as directed.Please advise. Returned call to patient's nurse  and instructed by on call physician that he is to go to ER.verbal understanding.

## 2020-10-21 NOTE — Sepsis Progress Note (Signed)
Following for sepsis monitoring ?

## 2020-10-21 NOTE — Progress Notes (Signed)
A consult was received from an ED physician for Cefepime and Linezolid per pharmacy dosing.  The patient's profile has been reviewed for ht/wt/allergies/indication/available labs.   A one time order has been placed for Cefepime 2g IV and Linezolid $RemoveBefor'600mg'[id number]$  IV.  Further antibiotics/pharmacy consults should be ordered by admitting physician if indicated.                       Thank you,  Gretta Arab PharmD, BCPS Clinical Pharmacist [facility] main pharmacy [phone] 10/21/2020 8:53 PM

## 2020-10-21 NOTE — ED Provider Notes (Addendum)
[facility] DEPT Provider Note   CSN: [id number] Arrival date & time: 10/21/20  1828     History Chief Complaint  Patient presents with  . Cellulitis    Micheal Walls is a 85 y.o. male.  HPI Patient presents almost 1 month after irritation of his first and second digits on the left foot, now with concern from his home health nurse for possible infection.  Patient denies chest pain, dyspnea, fever, chills, nausea, vomiting.  He notes no pain, but seemingly has substantial neuropathy.  Seemingly the patient had a home health visit today, and during wound care was informed that he may have an infection.  He was sent here for evaluation.  His history is notable for diabetes, prior amputation of the right leg, and now most recently amputation of the distal left foot.    Past Medical History:  Diagnosis Date  . Acute metabolic encephalopathy 06/19/7937  . Amputated below knee ([place])    right  . Anemia   . Aspiration pneumonia ([place]) 11/02/2019  . Diastolic heart failure ([place])   . Diverticulosis   . DM (diabetes mellitus) ([place])   . Gout   . Hyperlipemia   . OSA on CPAP   . RBBB   . Sepsis ([place]) 11/02/2019  . Systemic hypertension     Patient Active Problem List   Diagnosis Date Noted  . 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[place])   . Left ankle swelling   . Diabetic foot infection ([place])   . Cerebral thrombosis with cerebral infarction 07/20/2020  . Thrombocytopenia ([place]) 07/19/2020  . Cerebral ischemia 07/18/2020  . Bilateral hearing loss 05/20/2020  . Does use hearing aid 05/20/2020  . Diabetic ulcer of left great toe ([place]) 01/16/2020  . Controlled type 2 diabetes mellitus with stable proliferative retinopathy of both eyes, with long-term current use of insulin ([place]) 01/06/2020  . Intermediate stage nonexudative age-related macular degeneration of both eyes 01/06/2020  . Left epiretinal membrane 01/06/2020  . [place] of right macula 01/06/2020  . AKI  (acute kidney injury) ([place]) 11/02/2019  . Cellulitis 11/02/2019  . Elevated TSH 07/10/2018  . Nephropathy 02/28/2018  . Disturbance of skin sensation 02/28/2018  . PAD (peripheral artery disease) ([place]) 07/17/2017  . 110.1 10/21/2013  . Epiphora 04/24/2013  . Laxity of eyelid 04/24/2013  . Anemia 04/09/2013  . History of peripheral vascular disease 04/09/2013  . History of stroke 04/09/2013  . S/P unilateral BKA (below knee amputation) ([place]) 03/17/2013  . Diabetes mellitus type 2 in obese ([place]) 03/17/2013  . Mixed hyperlipidemia 03/17/2013  . Essential hypertension 03/17/2013  . Gout 03/17/2013  . CKD (chronic kidney disease), stage III ([place]) 03/17/2013  . OSA on CPAP 03/17/2013  . Anemia, iron deficiency 03/17/2013  . Diverticulosis of colon 03/17/2013  . RBBB 03/17/2013  . Punctal stenosis, acquired 10/02/2012    Past Surgical History:  Procedure Laterality Date  . ABDOMINAL AORTOGRAM W/LOWER EXTREMITY Left 08/11/2020   Procedure: ABDOMINAL AORTOGRAM W/LOWER EXTREMITY;  Surgeon: Serafina Mitchell, MD;  Location: [facility];  Service: Cardiovascular;  Laterality: Left;  . AMPUTATION Left 09/14/2020   Procedure: AMPUTATION LEFT GREAT TOE;  Surgeon: [name], DPM;  Location: [facility];  Service: Podiatry;  Laterality: Left;  . LEG AMPUTATION BELOW KNEE     right  . NM MYOCAR PERF WALL MOTION  09/18/2009   no ischemia  . PERIPHERAL VASCULAR BALLOON ANGIOPLASTY Left 08/11/2020   Procedure: PERIPHERAL VASCULAR BALLOON ANGIOPLASTY;  Surgeon: Trula Slade,  Butch Penny, MD;  Location: [facility];  Service: Cardiovascular;  Laterality: Left;  AT       Family History  Problem Relation Age of Onset  . Diabetes Mother   . Heart attack Father   . Cancer Sister     Social History   Tobacco Use  . Smoking status: Former Smoker    Types: Cigars    Quit date: 06/12/2001    Years since quitting: 19.3  . Smokeless tobacco: Never Used  Vaping Use  . Vaping Use: Former   Substance Use Topics  . Alcohol use: No    Alcohol/week: 0.0 standard drinks  . Drug use: No    Home Medications Prior to Admission medications   Medication Sig Start Date End Date Taking? Authorizing Provider  allopurinol (ZYLOPRIM) 100 MG tablet Take 100 mg by mouth daily.    [provider]  amoxicillin-clavulanate (AUGMENTIN) 875-125 MG tablet Take 1 tablet by mouth every 12 (twelve) hours. 10/05/20   [name], DPM  aspirin EC 81 MG tablet Take 81 mg by mouth daily. Swallow whole.    [provider]  Biotin 5000 MCG CAPS Take 5,000 mcg by mouth daily.    [provider]  [name] Apply between toes as needed for maceration. 08/08/20   [name], DPM  clopidogrel (PLAVIX) 75 MG tablet Take 1 tablet (75 mg total) by mouth daily. 10/15/20   [name], [name]  colchicine 0.6 MG tablet Take 0.6 mg by mouth daily as needed (gout flare up).    [provider]  Continuous Blood Gluc Sensor (FREESTYLE LIBRE 14 DAY SENSOR) MISC Use as directed to check blood sugars 05/13/20   [name], [name]  diltiazem (CARDIZEM CD) 300 MG 24 hr capsule Take 300 mg by mouth daily.    [provider]  dorzolamide (TRUSOPT) 2 % ophthalmic solution Place 1 drop into both eyes at bedtime.    [provider]  doxycycline (VIBRA-TABS) 100 MG tablet Take 1 tablet (100 mg total) by mouth every 12 (twelve) hours. 10/05/20   [name], DPM  ferrous sulfate 325 (65 FE) MG tablet Take 325 mg by mouth every Monday, Wednesday, and Friday.    [provider]  fish oil-omega-3 fatty acids 1000 MG capsule Take 2 g by mouth daily.    [provider]  furosemide (LASIX) 40 MG tablet Take 2 tabs in morning and 1 tab evening 10/15/20   [name], [name]  gabapentin (NEURONTIN) 100 MG capsule Take 2 capsules in am 1 capsule mid day and 1 capsule in evening 10/15/20   [name], [name]  glucose blood (FREESTYLE  PRECISION NEO TEST) test strip Use as instructed 07/30/19   [name], [name]  HYDROcodone-acetaminophen (NORCO/VICODIN) 5-325 MG tablet Take 1 tablet by mouth every 6 (six) hours as needed for moderate pain. 09/15/20 09/15/21  Kathie Dike, MD  latanoprost (XALATAN) 0.005 % ophthalmic solution Place 1 drop into both eyes at bedtime.  10/12/18   [provider]  Magnesium 250 MG TABS Take 1 tablet by mouth daily.    [provider]  Multiple Vitamin (MULTIVITAMIN WITH MINERALS) TABS tablet Take 1 tablet by mouth daily.    [provider]  Multiple Vitamins-Minerals (PRESERVISION AREDS PO) Take 1 tablet by mouth daily.    [provider]  pantoprazole (PROTONIX) 40 MG tablet Take 1 tablet (40 mg total) by mouth daily. 10/15/20   Laurance Flatten,  Doreene Burke, [name]  Polyvinyl Alcohol-Povidone (REFRESH OP) Place 1 drop into both eyes daily as needed (dry eyes).    [provider]  rosuvastatin (CRESTOR) 10 MG tablet Take 1 tablet (10 mg total) by mouth daily. Patient taking differently: Take 10 mg by mouth every 7 (seven) days. Wednesday of each week 11/07/19   Bonnielee Haff, MD  Semaglutide, [name], (OZEMPIC, [name],) 2 MG/1.5ML SOPN Inject 1 mg into the skin once a week. Patient taking differently: Inject 1 mg into the skin every Wednesday. 09/24/19   [name], [name]  tamsulosin (FLOMAX) 0.4 MG CAPS capsule Take 0.4 mg by mouth daily.    [provider]    Allergies    Vancomycin  Review of Systems   Review of Systems  Constitutional:       Per HPI, otherwise negative  HENT:       Per HPI, otherwise negative  Respiratory:       Per HPI, otherwise negative  Cardiovascular:       Per HPI, otherwise negative  Gastrointestinal: Negative for vomiting.  Endocrine:       Negative aside from HPI  Genitourinary:       Neg aside from HPI   Musculoskeletal:       Per HPI, otherwise negative  Skin: Positive for color change.  Neurological: Negative for  syncope.    Physical Exam Updated Vital Signs BP (!) 136/59   Pulse (!) 109   Temp 99.3 F (37.4 C) (Oral)   Resp 19   Ht $R'6\' 1"'OA$  (1.854 m)   Wt 122.5 kg   SpO2 99%   BMI 35.62 kg/m   Physical Exam Vitals and nursing note reviewed.  Constitutional:      General: He is not in acute distress.    Appearance: He is well-developed.  HENT:     Head: Normocephalic and atraumatic.  Eyes:     Conjunctiva/sclera: Conjunctivae normal.  Cardiovascular:     Rate and Rhythm: Normal rate and regular rhythm.     Comments: Patient has occasional tachycardia, rate 90s, 100s, sinus, abnormal Pulmonary:     Effort: Pulmonary effort is normal. No respiratory distress.     Breath sounds: No stridor.  Abdominal:     General: There is no distension.  Musculoskeletal:       Legs:  Skin:    General: Skin is warm and dry.  Neurological:     Mental Status: He is alert and oriented to person, place, and time.        ED Results / Procedures / Treatments   Labs (all labs ordered are listed, but only abnormal results are displayed) Labs Reviewed  COMPREHENSIVE METABOLIC PANEL - Abnormal; Notable for the following components:      Result Value   Sodium 134 (*)    Glucose, Bld 129 (*)    BUN 69 (*)    Creatinine, Ser 2.27 (*)    Albumin 3.3 (*)    GFR, Estimated 28 (*)    All other components within normal limits  CBC WITH DIFFERENTIAL/PLATELET - Abnormal; Notable for the following components:   WBC 17.8 (*)    RBC 3.35 (*)    Hemoglobin 9.7 (*)    HCT 31.4 (*)    RDW 15.8 (*)    Neutro Abs 15.6 (*)    Abs Immature Granulocytes 0.09 (*)    All other components within normal limits  CULTURE, BLOOD (ROUTINE X 2)  CULTURE, BLOOD (ROUTINE X  2)  SARS CORONAVIRUS 2 (TAT 6-24 HRS)  LACTIC ACID, PLASMA  PROTIME-INR  URINALYSIS, ROUTINE W REFLEX MICROSCOPIC    EKG None  Radiology DG Chest 2 View  Result Date: 10/21/2020 CLINICAL DATA:  85 year old male with sepsis. EXAM: CHEST -  2 VIEW COMPARISON:  Chest radiograph dated 08/09/2020. FINDINGS: Small right lung base atelectasis. Infiltrate is less likely but not excluded clinical correlation is recommended. There is no pleural effusion pneumothorax. The cardiac silhouette is within limits. No acute osseous pathology. IMPRESSION: Small right lung base atelectasis versus less likely infiltrate. Electronically Signed   By: Anner Crete M.D.   On: 10/21/2020 19:31   DG Foot 2 Views Left  Result Date: 10/21/2020 CLINICAL DATA:  85 year old male status post after a shin of the great toe. Evaluate for possibility of osteomyelitis. EXAM: LEFT FOOT - 2 VIEW COMPARISON:  Left foot radiograph dated 09/14/2020. FINDINGS: Status post prior amputation of the first MTP joint. There is no acute fracture or dislocation. The bones are osteopenic. No bone erosion or periosteal reaction to suggest acute osteomyelitis. Vascular calcifications noted. Mild soft tissue swelling of the forefoot and diffuse subcutaneous edema. No radiopaque foreign object or soft tissue gas. IMPRESSION: 1. Amputation of the first MTP joint. No acute fracture or dislocation. 2. No radiographic findings of acute osteomyelitis. 3. Soft tissue swelling of the forefoot and diffuse subcutaneous edema. Electronically Signed   By: Anner Crete M.D.   On: 10/21/2020 19:58    Procedures Procedures   Medications Ordered in ED Medications  ceFEPIme (MAXIPIME) 2 g in sodium chloride 0.9 % 100 mL IVPB (2 g Intravenous New Bag/Given 10/21/20 2057)  linezolid (ZYVOX) IVPB 600 mg (has no administration in time range)    ED Course  I have reviewed the triage vital signs and the nursing notes.  Pertinent labs & imaging results that were available during my care of the patient were reviewed by me and considered in my medical decision making (see chart for details).    9:09 PM On repeat exam the patient is calm.  I have reviewed the patient's chart, and documentation including  photos from a podiatry 2 days ago do not show similar erythema in the proximal foot.  With consideration of cellulitis, though he is taking doxycycline and ampicillin, with labs, vital signs consistent with SIRS versus sepsis, patient will require transition to IV antibiotics, ongoing fluids, linezolid, cefepime.  I discussed his case with our pharmacist prior to initiation of meds. MDM Rules/Calculators/A&P MDM Number of Diagnoses or Management Options Cellulitis of left lower extremity: new, needed workup SIRS (systemic inflammatory response syndrome) ([place]): new, needed workup   Amount and/or Complexity of Data Reviewed Clinical lab tests: ordered and reviewed Tests in the radiology section of CPT: ordered and reviewed Tests in the medicine section of CPT: reviewed and ordered Decide to obtain previous medical records or to obtain history from someone other than the patient: yes Review and summarize past medical records: yes Discuss the patient with other providers: yes Independent visualization of images, tracings, or specimens: yes  Risk of Complications, Morbidity, and/or Mortality Presenting problems: high Diagnostic procedures: high Management options: high  Critical Care Total time providing critical care: 30-74 minutes (35)  Patient Progress Patient progress: stable  Final Clinical Impression(s) / ED Diagnoses Final diagnoses:  SIRS (systemic inflammatory response syndrome) ([place])  Cellulitis of left lower extremity     Carmin Muskrat, MD 10/21/20 2111    Carmin Muskrat, MD 10/21/20 2143

## 2020-10-21 NOTE — ED Notes (Signed)
I reached out to [facility] about this patient's assigned bed. They said that they would pass it along to the charge nurse and work on getting them assigned to a nurse.

## 2020-10-21 NOTE — Progress Notes (Signed)
Chronic Care Management Pharmacy Note  10/28/2020 Name:  Micheal Walls MRN:  [id number] DOB:  [birth year]  Subjective: Micheal Walls is an 85 y.o. year old male who is a primary patient of [name], [place].  The CCM team was consulted for assistance with disease management and care coordination needs.  Patient had diarrhea yesterday.   Engaged with patient by telephone for follow up visit in response to provider referral for pharmacy case management and/or care coordination services.   Consent to Services:  The patient was given information about Chronic Care Management services, agreed to services, and gave verbal consent prior to initiation of services.  Please see initial visit note for detailed documentation.   Patient Care Team: [name], [place] as PCP - General (General Practice) [name], [name], MD as PCP - Cardiology (Cardiology) Rex Kras, Claudette Stapler, RN as [place] as [place], Sharyn Blitz, [facility] (Pharmacist)  Recent office visits: 09/28/2020 PCP OV   Recent consult visits:  10/19/2020 Podiatry OV  10/05/2020 Podiatry OV 09/29/2020 [facility] visits: None in previous 6 months  Objective:  Lab Results  Component Value Date   CREATININE 1.90 (H) 10/24/2020   BUN 65 (H) 10/24/2020   GFRNONAA 35 (L) 10/24/2020   GFRAA 42 (L) 05/20/2020   NA 134 (L) 10/24/2020   K 4.1 10/24/2020   CALCIUM 9.2 10/24/2020   CO2 25 10/24/2020   GLUCOSE 160 (H) 10/24/2020    Lab Results  Component Value Date/Time   HGBA1C 5.9 (H) 10/21/2020 09:38 PM   HGBA1C 6.1 (H) 07/19/2020 03:30 AM   MICROALBUR 30 09/28/2020 03:58 PM   MICROALBUR 80 05/15/2019 10:28 AM    Last diabetic Eye exam:  Lab Results  Component Value Date/Time   HMDIABEYEEXA Retinopathy (A) 07/10/2020 12:00 AM    Last diabetic Foot exam: No results found for: HMDIABFOOTEX   Lab Results  Component Value Date   CHOL  90 07/19/2020   HDL 38 (L) 07/19/2020   LDLCALC 43 07/19/2020   TRIG 44 07/19/2020   CHOLHDL 2.4 07/19/2020    Hepatic Function Latest Ref Rng & Units 10/21/2020 09/28/2020 08/27/2020  Total Protein 6.5 - 8.1 g/dL 7.6 7.6 7.1  Albumin 3.5 - 5.0 g/dL 3.3(L) 3.9 -  AST 15 - 41 U/L 21 22 29   ALT 0 - 44 U/L 22 27 44  Alk Phosphatase 38 - 126 U/L 68 92 -  Total Bilirubin 0.3 - 1.2 mg/dL 0.4 0.3 0.4  Bilirubin, Direct 0.00 - 0.40 mg/dL - - -    Lab Results  Component Value Date/Time   TSH 2.340 01/23/2020 02:54 PM   TSH 2.760 03/21/2019 10:35 AM    CBC Latest Ref Rng & Units 10/24/2020 10/23/2020 10/22/2020  WBC 4.0 - 10.5 K/uL 8.8 11.2(H) 15.5(H)  Hemoglobin 13.0 - 17.0 g/dL 8.1(L) 8.4(L) 8.7(L)  Hematocrit 39.0 - 52.0 % 26.1(L) 27.0(L) 27.8(L)  Platelets 150 - 400 K/uL 206 183 192    No results found for: VD25OH  Clinical ASCVD: Yes  The ASCVD Risk score [name] DC Jr., et al., 2013) failed to calculate for the following reasons:   The 2013 ASCVD risk score is only valid for ages 53 to 21   The patient has a prior MI or stroke diagnosis    Depression screen [facility] 2/9 05/20/2020 09/24/2019 06/25/2019  Decreased Interest 0 0 0  Down, Depressed, Hopeless 0 0 0  PHQ - 2  Score 0 0 0  Altered sleeping - - -  Tired, decreased energy - - -  Change in appetite - - -  Feeling bad or failure about yourself  - - -  Trouble concentrating - - -  Moving slowly or fidgety/restless - - -  Suicidal thoughts - - -  PHQ-9 Score - - -  Difficult doing work/chores - - -  Some recent data might be hidden     Social History   Tobacco Use  Smoking Status Former Smoker  . Types: Cigars  . Quit date: 06/12/2001  . Years since quitting: 19.3  Smokeless Tobacco Never Used   BP Readings from Last 3 Encounters:  10/24/20 (!) 122/56  09/28/20 138/70  09/16/20 (!) 122/42   Pulse Readings from Last 3 Encounters:  10/24/20 72  09/28/20 78  09/16/20 86   Wt Readings from Last 3 Encounters:   10/21/20 270 lb (122.5 kg)  09/11/20 253 lb 9.6 oz (115 kg)  09/04/20 253 lb 9.6 oz (115 kg)   BMI Readings from Last 3 Encounters:  10/21/20 35.62 kg/m  09/11/20 33.46 kg/m  09/04/20 33.46 kg/m    Assessment/Interventions: Review of patient past medical history, allergies, medications, health status, including review of consultants reports, laboratory and other test data, was performed as part of comprehensive evaluation and provision of chronic care management services.   SDOH:  (Social Determinants of Health) assessments and interventions performed: No  SDOH Screenings   Alcohol Screen: Not on file  Depression (PHQ2-9): Low Risk   . PHQ-2 Score: 0  Financial Resource Strain: Low Risk   . Difficulty of Paying Living Expenses: Not hard at all  Food Insecurity: No Food Insecurity  . Worried About Charity fundraiser in the Last Year: Never true  . Ran Out of Food in the Last Year: Never true  Housing: Not on file  Physical Activity: Inactive  . Days of Exercise per Week: 0 days  . Minutes of Exercise per Session: 0 min  Social Connections: Not on file  Stress: No Stress Concern Present  . Feeling of Stress : Not at all  Tobacco Use: Medium Risk  . Smoking Tobacco Use: Former Smoker  . Smokeless Tobacco Use: Never Used  Transportation Needs: No Transportation Needs  . Lack of Transportation (Medical): No  . Lack of Transportation (Non-Medical): No    CCM Care Plan  Allergies  Allergen Reactions  . Vancomycin Rash    Medications Reviewed Today    Reviewed by Lynne Logan, RN (Registered Nurse) on 10/27/20 at 1516  Med List Status: <None>  Medication Order Taking? Sig Documenting Provider Last Dose Status Informant  allopurinol (ZYLOPRIM) 100 MG tablet [id number] No Take 100 mg by mouth daily. [provider] 10/21/2020 Unknown time Active Child           Med Note Alesia Banda, CHASIE F   Thu Oct 22, 2020  9:51 AM)    aspirin EC 81 MG tablet [id number] No  Take 81 mg by mouth daily. Swallow whole. [provider] 10/21/2020 Unknown time Active Child  cefdinir (OMNICEF) 300 MG capsule [id number]  Take 1 capsule (300 mg total) by mouth 2 (two) times daily. Mercy Riding, MD  Active   clopidogrel (PLAVIX) 75 MG tablet [id number] No Take 1 tablet (75 mg total) by mouth daily. [name], [place] 10/21/2020 1400 Active Child           Med Note (SUTPHIN, CHASIE F   Thu  Oct 22, 2020  9:57 AM)    Continuous Blood Gluc Sensor (FREESTYLE LIBRE 14 DAY SENSOR) [place] [id number] No Use as directed to check blood sugars [name], [name] Taking Active Child  diltiazem (CARDIZEM CD) 300 MG 24 hr capsule [id number] No Take 300 mg by mouth daily. [provider] 10/21/2020 Unknown time Active Child  dorzolamide (TRUSOPT) 2 % ophthalmic solution [id number] No Place 1 drop into both eyes at bedtime. [provider] 10/21/2020 Unknown time Active Child  ferrous sulfate 325 (65 FE) MG tablet [id number] No Take 325-650 mg by mouth See admin instructions. 32m on Monday Wednesday Friday 6521mon Sunday Tuesday Thursday saturday [provider] 10/21/2020 Unknown time Active Child           Med Note (PJeoffrey Masseday 11, 2022 10:21 AM) Taking 2 tablets Monday, Wednesday and Friday and then 1 tablet on Tuesday, Thursday, Saturday and Sunday.   furosemide (LASIX) 40 MG tablet [id number] 2 tabs in morning and 1 tab evening [name], [name], MD  Active   gabapentin (NEURONTIN) 100 MG capsule [id number] Take 2 capsules in am 1 capsule mid day and 1 capsule in evening  Patient taking differently: Take 100-200 mg by mouth See admin instructions. Take 20079mn am 100m51mid day and 100mg48mevening   MooreMinette Brine 10/21/2020 Unknown time Active Child           Med Note (SUTPHIN, CHASIE F   Thu Oct 22, 2020  9:59 AM)    glucose blood (FREESTYLE PRECISION NEO TEST) test strip [id number] as instructed MooreMinette Brine Taking Active  Child  latanoprost (XALATAN) 0.005 % ophthalmic solution [id number] 1 drop into both eyes at bedtime.  [provider] 10/21/2020 Unknown time Active Child           Med Note (SUTPHIN, CHASIE F   Thu Oct 22, 2020 10:00 AM)    linezolid (ZYVOX) 600 MG tablet [id number] 1 tablet (600 mg total) by mouth 2 (two) times daily. GonfaMercy Riding Active   Multiple Vitamins-Minerals (PRESERVISION AREDS PO) [id number] 1 tablet by mouth daily.  Patient not taking: Reported on 10/22/2020   [provider] Not Taking Unknown time Active Child  pantoprazole (PROTONIX) 40 MG tablet [id number] 1 tablet (40 mg total) by mouth daily. MooreMinette Brine 10/21/2020 Unknown time Active Child           Med Note (SUTPHIN, CHASIE F   Thu Oct 22, 2020 10:00 AM)    rosuvastatin (CRESTOR) 10 MG tablet [id number] 1 tablet (10 mg total) by mouth daily.  Patient taking differently: Take 10 mg by mouth every 7 (seven) days. Wednesday of each week   KrishBonnielee Haff5/04/2021 Unknown time Active Child  Semaglutide, [name], (OZEMPIC, [name],) 2 MG/1.5ML SOPN [id number] 1 mg into the skin once a week.  Patient taking differently: Inject 1 mg into the skin every Wednesday.   MooreMinette Brine 10/21/2020 Unknown time Active Child           Med Note (DALYDuffy BruceHY N   Thu Jan 16, 2020  8:46 PM)    senna-docusate (SENOKOT-S) 8.6-50 MG tablet [id number] 1 tablet by mouth 2 (two) times daily between meals as needed for mild constipation or moderate constipation. GonfaMercy Riding Active   tamsulosin (FLOMAX) 0.4  MG CAPS capsule [id number] No Take 0.4 mg by mouth daily. [provider] 10/21/2020 Unknown time Active Child          Patient Active Problem List   Diagnosis Date Noted  . Infection of amputation stump of left lower extremity ([place]) 10/21/2020  . 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[place])   . Left ankle swelling   . Diabetic foot infection ([place])   .  Cerebral thrombosis with cerebral infarction 07/20/2020  . Thrombocytopenia ([place]) 07/19/2020  . Cerebral ischemia 07/18/2020  . Bilateral hearing loss 05/20/2020  . Does use hearing aid 05/20/2020  . Diabetic ulcer of left great toe ([place]) 01/16/2020  . Controlled type 2 diabetes mellitus with stable proliferative retinopathy of both eyes, with long-term current use of insulin ([place]) 01/06/2020  . Intermediate stage nonexudative age-related macular degeneration of both eyes 01/06/2020  . Left epiretinal membrane 01/06/2020  . Dr[place] of right macula 01/06/2020  . AKI (acute kidney injury) ([place]) 11/02/2019  . Cellulitis 11/02/2019  . Elevated TSH 07/10/2018  . Nephropathy 02/28/2018  . Disturbance of skin sensation 02/28/2018  . PAD (peripheral artery disease) ([place]) 07/17/2017  . 110.1 10/21/2013  . Epiphora 04/24/2013  . Laxity of eyelid 04/24/2013  . Anemia 04/09/2013  . History of peripheral vascular disease 04/09/2013  . History of stroke 04/09/2013  . S/P unilateral BKA (below knee amputation) ([place]) 03/17/2013  . Diabetes mellitus type 2 in obese ([place]) 03/17/2013  . Mixed hyperlipidemia 03/17/2013  . Essential hypertension 03/17/2013  . Gout 03/17/2013  . CKD (chronic kidney disease), stage III ([place]) 03/17/2013  . OSA on CPAP 03/17/2013  . Anemia, iron deficiency 03/17/2013  . Diverticulosis of colon 03/17/2013  . RBBB 03/17/2013  . Punctal stenosis, acquired 10/02/2012    Immunization History  Administered Date(s) Administered  . Fluad Quad(high Dose 65+) 05/20/2020  . Influenza, High Dose Seasonal PF 03/21/2019  . PFIZER(Purple Top)SARS-COV-2 Vaccination 08/31/2019, 09/25/2019, 04/03/2020  . Pneumococcal Conjugate-13 02/23/2016  . Pneumococcal Polysaccharide-23 05/15/2019  . Tdap 02/04/2013    Conditions to be addressed/monitored:  Hypertension, Hyperlipidemia and Diabetes  Care Plan : [place]  Updates made by Mayford Knife, RPH since 10/28/2020  12:00 AM    Problem: HTN, HLD, DM II, GOUT, NEUROPATHY   Priority: High    Long-Range Goal: Disease Management   Recent Progress: On track  Priority: High  Note:     Current Barriers:  . Unable to independently monitor therapeutic efficacy . Unable to self administer medications as prescribed . Does not adhere to prescribed medication regimen   Pharmacist Clinical Goal(s):  Marland Kitchen Patient will achieve adherence to monitoring guidelines and medication adherence to achieve therapeutic efficacy . contact provider office for questions/concerns as evidenced notation of same in electronic health record through collaboration with PharmD and provider.    Interventions: . 1:1 collaboration with [name], [name] regarding development and update of comprehensive plan of care as evidenced by provider attestation and co-signature . Inter-disciplinary care team collaboration (see longitudinal plan of care) . Comprehensive medication review performed; medication list updated in electronic medical record  Hypertension (BP goal <130/80) -Controlled -Current treatment: . Diltiazem 300 mg capsule take once per day  -Current home readings: patient is not currently checking his BP at home  -Current dietary habits: will discuss in greater detail at next office visit  -Current exercise habits: patient is not currently exercising  -Denies hypotensive/hypertensive symptoms -Educated on BP goals and benefits of medications for prevention of heart attack, stroke  and kidney damage; Daily salt intake goal < 2300 mg; Exercise goal of 150 minutes per week; Proper BP monitoring technique; -Counseled to monitor BP at home once a day, document, and provide log at future appointments -Using large BP cuff that daughter purchased  -Counseled on diet and exercise extensively Recommended to continue current medication -Will have CPA onboard patient to [facility] to help with medication adherence and  accessibility.   Hyperlipidemia: (LDL goal < 70) -Controlled -Current treatment: . Rosuvastatin 10 mg tablet daily  -Current dietary patterns: patient reports that he is not eating as healthy as he should. Eating some fried foods.  -Current exercise habits: at this time the patient is not doing any exercises.  -Educated on Cholesterol goals;  Benefits of statin for ASCVD risk reduction; Importance of limiting foods high in cholesterol; -Recommended to continue current medication     Diabetes (A1c goal <7%) -Controlled -Current medications: . Ozempic 1 mg  - inject once a week on Wednesday  -Current home glucose readings  Patient has a CGM and his BS this morning is 114    At this time he does not allow his daughter access to the  numbers.  -Denies hypoglycemic/hyperglycemic symptoms -Current meal patterns: Will discuss at next visit  . drinks: Glucerna three times per day  -Current exercise: none noted  -Educated on A1c and blood sugar goals; Complications of diabetes including kidney damage, retinal damage, and cardiovascular disease; Prevention and management of hypoglycemic episodes; Benefits of routine self-monitoring of blood sugar; Continuous glucose monitoring; -Patient recently discharged from the hospital, patient is interested in trying  -Counseled to check feet daily and get yearly eye exams -Patient asked about Magic Cup nutrition - contacted [employer] and received nutritional facts via e-mail from the company for the ingredient list.  -Recommended to continue current medication Counseled on the importance of CGM.   GOUT (Goal: prevent flare ups) -Controlled -Current treatment  . Allopurinol 100 mg tablet twice per day  . Colcrys 0.6 mg - as needed for gout flare up  -Counseled on diet and exercise extensively Recommended that patient continue to be careful with his diet.   Neuropathy (Goal: to prevent nerve pain) -Controlled -Current treatment  . Gabapentin  100 mg capsule: Two in the morning, 1 at midday and 1 at night.  -Recommended to continue current medication  Query BPH -Controlled -Current treatment  . Tamsulosin 0.4 mg capsule once per day  -Recommended to continue current medication Will collaborate with PCP and other providers to dertemine what medications patient should be currently taking for BPH.    GERD (Goal: reduce signs and symptoms) -Controlled -Current treatment  . Pantoprazole 40 mg tablet daily  -Patients daughter reports her father has not complained about GERD symptoms for a long time. -Collaborate with PCP team to determine if medication is necessary at this time, patient might be a candidate to taper medication down.   Health Maintenance -Vaccine Therapy:  will discuss with patient getting zoster vaccine during next office visit.  -Current therapy:  . Ferrous Sulfate 325 (65 FE) - take 1 tablet twice per day on Monday, Wednesday and Friday and take one tablet all other days.  . Preservision  . Refresh Ophthalmic  . Colace 100 mg taking one capsule daily  -Educated on Herbal supplement research is limited and benefits usually cannot be proven Cost vs benefit of each product must be carefully weighed by individual consumer Supplements may interfere with prescription drugs -Patient is satisfied with current  therapy and denies issues -Recommended to continue current medication   Patient Goals/Self-Care Activities . Patient will:  - take medications as prescribed focus on medication adherence by using a pill packaging system  check glucose using CGM system, document, and provide at future appointments  Patient Goals/Self-Care Activities . Patient will:  - take medications as prescribed  Follow Up Plan: The patient has been provided with contact information for the care management team and has been advised to call with any health related questions or concerns.       Medication Assistance: None required.   Patient affirms current coverage meets needs.  Patient's preferred pharmacy is:  [id number] [place], [place] [address] [address] [address]Phone: [phone]: [phone] [place] [address]Phone: [phone]: [phone] [facility] [place]- [address] [place]Phone: [phone]: [phone] Uses pill box? No - patient currently keeps all of his bottles lined up.  Pt endorses 90% compliance  We discussed: Benefits of medication synchronization, packaging and delivery as well as enhanced pharmacist oversight with [facility]. Patient decided to: Utilize [facility] pharmacy for medication synchronization, packaging and delivery  Care Plan and Follow Up Patient Decision:  Patient agrees to Care Plan and Follow-up.  Plan: The patient has been provided with contact information for the care management team and has been advised to call with any health related questions or concerns.   VOrlando Penner PharmD Clinical Pharmacist [facility] [phone]

## 2020-10-21 NOTE — Telephone Encounter (Signed)
-----   Message from [name], [place] sent at 10/19/2020  5:08 PM EDT ----- Call to see if he is going to wound care.   ----- Message ----- From: [name], DPM Sent: 10/19/2020  10:03 AM EDT To: [name], [name]

## 2020-10-21 NOTE — Telephone Encounter (Signed)
I called and left pt vm to call the office [facility]

## 2020-10-22 ENCOUNTER — Encounter (HOSPITAL_COMMUNITY): Payer: Medicare Other

## 2020-10-22 ENCOUNTER — Encounter: Payer: Self-pay | Admitting: Nurse Practitioner

## 2020-10-22 DIAGNOSIS — R7989 Other specified abnormal findings of blood chemistry: Secondary | ICD-10-CM | POA: Diagnosis not present

## 2020-10-22 DIAGNOSIS — E1151 Type 2 diabetes mellitus with diabetic peripheral angiopathy without gangrene: Secondary | ICD-10-CM | POA: Diagnosis present

## 2020-10-22 DIAGNOSIS — R6520 Severe sepsis without septic shock: Secondary | ICD-10-CM | POA: Diagnosis present

## 2020-10-22 DIAGNOSIS — Y835 Amputation of limb(s) as the cause of abnormal reaction of the patient, or of later complication, without mention of misadventure at the time of the procedure: Secondary | ICD-10-CM | POA: Diagnosis present

## 2020-10-22 DIAGNOSIS — I96 Gangrene, not elsewhere classified: Secondary | ICD-10-CM | POA: Diagnosis not present

## 2020-10-22 DIAGNOSIS — E1122 Type 2 diabetes mellitus with diabetic chronic kidney disease: Secondary | ICD-10-CM | POA: Diagnosis not present

## 2020-10-22 DIAGNOSIS — N400 Enlarged prostate without lower urinary tract symptoms: Secondary | ICD-10-CM | POA: Diagnosis present

## 2020-10-22 DIAGNOSIS — H353132 Nonexudative age-related macular degeneration, bilateral, intermediate dry stage: Secondary | ICD-10-CM | POA: Diagnosis present

## 2020-10-22 DIAGNOSIS — G4733 Obstructive sleep apnea (adult) (pediatric): Secondary | ICD-10-CM | POA: Diagnosis present

## 2020-10-22 DIAGNOSIS — E113553 Type 2 diabetes mellitus with stable proliferative diabetic retinopathy, bilateral: Secondary | ICD-10-CM | POA: Diagnosis present

## 2020-10-22 DIAGNOSIS — R6510 Systemic inflammatory response syndrome (SIRS) of non-infectious origin without acute organ dysfunction: Secondary | ICD-10-CM | POA: Diagnosis not present

## 2020-10-22 DIAGNOSIS — L03116 Cellulitis of left lower limb: Secondary | ICD-10-CM | POA: Diagnosis not present

## 2020-10-22 DIAGNOSIS — M109 Gout, unspecified: Secondary | ICD-10-CM | POA: Diagnosis present

## 2020-10-22 DIAGNOSIS — E785 Hyperlipidemia, unspecified: Secondary | ICD-10-CM | POA: Diagnosis present

## 2020-10-22 DIAGNOSIS — Z833 Family history of diabetes mellitus: Secondary | ICD-10-CM | POA: Diagnosis not present

## 2020-10-22 DIAGNOSIS — I10 Essential (primary) hypertension: Secondary | ICD-10-CM | POA: Diagnosis not present

## 2020-10-22 DIAGNOSIS — E669 Obesity, unspecified: Secondary | ICD-10-CM | POA: Diagnosis present

## 2020-10-22 DIAGNOSIS — N189 Chronic kidney disease, unspecified: Secondary | ICD-10-CM | POA: Diagnosis not present

## 2020-10-22 DIAGNOSIS — A419 Sepsis, unspecified organism: Secondary | ICD-10-CM | POA: Diagnosis not present

## 2020-10-22 DIAGNOSIS — I130 Hypertensive heart and chronic kidney disease with heart failure and stage 1 through stage 4 chronic kidney disease, or unspecified chronic kidney disease: Secondary | ICD-10-CM | POA: Diagnosis present

## 2020-10-22 DIAGNOSIS — T8744 Infection of amputation stump, left lower extremity: Secondary | ICD-10-CM | POA: Diagnosis not present

## 2020-10-22 DIAGNOSIS — Z89511 Acquired absence of right leg below knee: Secondary | ICD-10-CM | POA: Diagnosis not present

## 2020-10-22 DIAGNOSIS — N179 Acute kidney failure, unspecified: Secondary | ICD-10-CM | POA: Diagnosis not present

## 2020-10-22 DIAGNOSIS — N1832 Chronic kidney disease, stage 3b: Secondary | ICD-10-CM | POA: Diagnosis present

## 2020-10-22 DIAGNOSIS — G629 Polyneuropathy, unspecified: Secondary | ICD-10-CM | POA: Diagnosis present

## 2020-10-22 DIAGNOSIS — I739 Peripheral vascular disease, unspecified: Secondary | ICD-10-CM | POA: Diagnosis not present

## 2020-10-22 DIAGNOSIS — D631 Anemia in chronic kidney disease: Secondary | ICD-10-CM | POA: Diagnosis present

## 2020-10-22 DIAGNOSIS — Z20822 Contact with and (suspected) exposure to covid-19: Secondary | ICD-10-CM | POA: Diagnosis present

## 2020-10-22 DIAGNOSIS — Z1629 Resistance to other single specified antibiotic: Secondary | ICD-10-CM | POA: Diagnosis present

## 2020-10-22 DIAGNOSIS — I4510 Unspecified right bundle-branch block: Secondary | ICD-10-CM | POA: Diagnosis present

## 2020-10-22 DIAGNOSIS — R5381 Other malaise: Secondary | ICD-10-CM | POA: Diagnosis not present

## 2020-10-22 DIAGNOSIS — I5032 Chronic diastolic (congestive) heart failure: Secondary | ICD-10-CM | POA: Diagnosis not present

## 2020-10-22 LAB — URINALYSIS, ROUTINE W REFLEX MICROSCOPIC
Bacteria, UA: NONE SEEN
Bilirubin Urine: NEGATIVE
Glucose, UA: NEGATIVE mg/dL
Hgb urine dipstick: NEGATIVE
Ketones, ur: NEGATIVE mg/dL
Leukocytes,Ua: NEGATIVE
Nitrite: NEGATIVE
Protein, ur: 30 mg/dL — AB
Specific Gravity, Urine: 1.008 (ref 1.005–1.030)
pH: 8 (ref 5.0–8.0)

## 2020-10-22 LAB — GLUCOSE, CAPILLARY
Glucose-Capillary: 120 mg/dL — ABNORMAL HIGH (ref 70–99)
Glucose-Capillary: 150 mg/dL — ABNORMAL HIGH (ref 70–99)
Glucose-Capillary: 125 mg/dL — ABNORMAL HIGH (ref 70–99)
Glucose-Capillary: 143 mg/dL — ABNORMAL HIGH (ref 70–99)

## 2020-10-22 LAB — BASIC METABOLIC PANEL
Anion gap: 1 — ABNORMAL LOW (ref 5–15)
BUN: 65 mg/dL — ABNORMAL HIGH (ref 8–23)
CO2: 32 mmol/L (ref 22–32)
Calcium: 9.3 mg/dL (ref 8.9–10.3)
Chloride: 102 mmol/L (ref 98–111)
Creatinine, Ser: 2.53 mg/dL — ABNORMAL HIGH (ref 0.61–1.24)
GFR, Estimated: 25 mL/min — ABNORMAL LOW (ref >60)
Glucose, Bld: 136 mg/dL — ABNORMAL HIGH (ref 70–99)
Potassium: 4.4 mmol/L (ref 3.5–5.1)
Sodium: 135 mmol/L (ref 135–145)

## 2020-10-22 LAB — CBC
HCT: 27.8 % — ABNORMAL LOW (ref 39.0–52.0)
Hemoglobin: 8.7 g/dL — ABNORMAL LOW (ref 13.0–17.0)
MCH: 29.3 pg (ref 26.0–34.0)
MCHC: 31.3 g/dL (ref 30.0–36.0)
MCV: 93.6 fL (ref 80.0–100.0)
Platelets: 192 10*3/uL (ref 150–400)
RBC: 2.97 MIL/uL — ABNORMAL LOW (ref 4.22–5.81)
RDW: 15.9 % — ABNORMAL HIGH (ref 11.5–15.5)
WBC: 15.5 10*3/uL — ABNORMAL HIGH (ref 4.0–10.5)
nRBC: 0 % (ref 0.0–0.2)

## 2020-10-22 LAB — HEMOGLOBIN A1C
Hgb A1c MFr Bld: 5.9 % — ABNORMAL HIGH (ref 4.8–5.6)
Mean Plasma Glucose: 122.63 mg/dL

## 2020-10-22 LAB — SEDIMENTATION RATE: Sed Rate: 80 mm/hr — ABNORMAL HIGH (ref 0–16)

## 2020-10-22 LAB — SARS CORONAVIRUS 2 (TAT 6-24 HRS): SARS Coronavirus 2: NEGATIVE

## 2020-10-22 MED ORDER — ALLOPURINOL 100 MG PO TABS
100.0000 mg | ORAL_TABLET | Freq: Every day | ORAL | Status: DC
  Administered 2020-10-22 – 2020-10-24 (×3): 100 mg via ORAL
  Filled 2020-10-22 (×3): qty 1

## 2020-10-22 MED ORDER — GABAPENTIN 100 MG PO CAPS
100.0000 mg | ORAL_CAPSULE | ORAL | Status: DC
  Administered 2020-10-22 – 2020-10-23 (×3): 100 mg via ORAL
  Filled 2020-10-22 (×3): qty 1

## 2020-10-22 MED ORDER — LACTATED RINGERS IV SOLN: INTRAVENOUS | Status: DC

## 2020-10-22 MED ORDER — SEMAGLUTIDE (1 MG/DOSE) 2 MG/1.5ML ~~LOC~~ SOPN: 1.0000 mg | PEN_INJECTOR | SUBCUTANEOUS | Status: DC

## 2020-10-22 MED ORDER — FERROUS SULFATE 325 (65 FE) MG PO TABS
325.0000 mg | ORAL_TABLET | Freq: Every day | ORAL | Status: DC
  Administered 2020-10-23 – 2020-10-24 (×2): 325 mg via ORAL
  Filled 2020-10-22 (×2): qty 1

## 2020-10-22 MED ORDER — GABAPENTIN 100 MG PO CAPS
100.0000 mg | ORAL_CAPSULE | Freq: Three times a day (TID) | ORAL | Status: DC
Start: 2020-10-22 — End: 2020-10-22

## 2020-10-22 MED ORDER — DORZOLAMIDE HCL 2 % OP SOLN
1.0000 [drp] | Freq: Every day | OPHTHALMIC | Status: DC
  Administered 2020-10-22 – 2020-10-23 (×2): 1 [drp] via OPHTHALMIC
  Filled 2020-10-22: qty 10

## 2020-10-22 MED ORDER — CLOPIDOGREL BISULFATE 75 MG PO TABS
75.0000 mg | ORAL_TABLET | Freq: Every day | ORAL | Status: DC
  Administered 2020-10-22 – 2020-10-24 (×3): 75 mg via ORAL
  Filled 2020-10-22 (×3): qty 1

## 2020-10-22 MED ORDER — JUVEN PO PACK
1.0000 | PACK | Freq: Two times a day (BID) | ORAL | Status: DC
  Administered 2020-10-22 – 2020-10-24 (×4): 1 via ORAL
  Filled 2020-10-22 (×5): qty 1

## 2020-10-22 MED ORDER — ROSUVASTATIN CALCIUM 10 MG PO TABS: 10.0000 mg | ORAL_TABLET | ORAL | Status: DC

## 2020-10-22 MED ORDER — PANTOPRAZOLE SODIUM 40 MG PO TBEC
40.0000 mg | DELAYED_RELEASE_TABLET | Freq: Every day | ORAL | Status: DC
  Administered 2020-10-22 – 2020-10-24 (×3): 40 mg via ORAL
  Filled 2020-10-22 (×3): qty 1

## 2020-10-22 MED ORDER — RESOURCE INSTANT PROTEIN PO PWD PACKET
1.0000 | Freq: Three times a day (TID) | ORAL | Status: DC
  Administered 2020-10-22 – 2020-10-24 (×4): 6 g via ORAL
  Filled 2020-10-22 (×7): qty 6

## 2020-10-22 MED ORDER — DILTIAZEM HCL ER COATED BEADS 180 MG PO CP24
300.0000 mg | ORAL_CAPSULE | Freq: Every day | ORAL | Status: DC
  Administered 2020-10-22 – 2020-10-24 (×3): 300 mg via ORAL
  Filled 2020-10-22 (×4): qty 1

## 2020-10-22 MED ORDER — ASPIRIN EC 81 MG PO TBEC
81.0000 mg | DELAYED_RELEASE_TABLET | Freq: Every day | ORAL | Status: DC
  Administered 2020-10-22 – 2020-10-24 (×3): 81 mg via ORAL
  Filled 2020-10-22 (×3): qty 1

## 2020-10-22 MED ORDER — GABAPENTIN 100 MG PO CAPS
200.0000 mg | ORAL_CAPSULE | Freq: Every day | ORAL | Status: DC
  Administered 2020-10-22 – 2020-10-24 (×4): 200 mg via ORAL
  Filled 2020-10-22 (×4): qty 2

## 2020-10-22 MED ORDER — SODIUM CHLORIDE 0.9 % IV SOLN: INTRAVENOUS | Status: DC

## 2020-10-22 MED ORDER — TAMSULOSIN HCL 0.4 MG PO CAPS
0.4000 mg | ORAL_CAPSULE | Freq: Every day | ORAL | Status: DC
  Administered 2020-10-22 – 2020-10-24 (×3): 0.4 mg via ORAL
  Filled 2020-10-22 (×3): qty 1

## 2020-10-22 NOTE — Consult Note (Signed)
WOC consulted for amputation site, reviewed chart patient has had recent great toe amputation on the LLE, notified admitting MD to consult surgeon that performed procedure for follow up on needed care for site.   For that reason WOC will not consult.   [place], [place], [place]

## 2020-10-22 NOTE — Progress Notes (Addendum)
Initial Nutrition Assessment  DOCUMENTATION CODES:   Obesity unspecified  INTERVENTION:   -Juven Fruit Punch BID, each serving provides 95kcal and 2.5g of protein (amino acids glutamine and arginine)  -Beneprotein powder TID with meals, each provides 25 kcals and 6g protein.  -Magic cup BID with meals, each supplement provides 290 kcal and 9 grams of protein  NUTRITION DIAGNOSIS:   Increased nutrient needs related to wound healing as evidenced by estimated needs.  GOAL:   Patient will meet greater than or equal to 90% of their needs  MONITOR:   PO intake,Supplement acceptance,Labs,Weight trends,I & O's,Skin  REASON FOR ASSESSMENT:   Consult Wound healing  ASSESSMENT:   85 y.o. male with medical history significant of DM2, PAD, R BKA.  Patient in room sleeping, awoke to speak with RD. Pt states he hasn't been eating well lately. Pt states his appetite has been down and he hasn't been eating much of his meals. Pt consumed 75% of breakfast this morning.  Discussed protein supplement options but pt was not open to trying many of our options. Pt described Ensure/Boost as "paper thinner".  Pt agreed to try Magic cups and Beneprotein powder.  Added Juven to aid in wound healing.  Per weight records, pt has lost 9 lbs since 2/27 (3% wt loss x 2.5 months, insignificant for time frame).  Medications reviewed.  Labs reviewed:  CBGs: 120-150  NUTRITION - FOCUSED PHYSICAL EXAM:  No depletions noted.  Diet Order:   Diet Order            Diet Carb Modified Fluid consistency: Thin; Room service appropriate? Yes  Diet effective now                 EDUCATION NEEDS:   Education needs have been addressed  Skin:  Skin Assessment: Reviewed RN Assessment   Rt BKA  Last BM:  5/11  Height:   Ht Readings from Last 1 Encounters:  10/21/20 $RemoveB'6\' 1"'tpSiYJnW$  (1.854 m)    Weight:   Wt Readings from Last 1 Encounters:  10/21/20 122.5 kg   BMI:  Body mass index is 35.62  kg/m.  Estimated Nutritional Needs:   Kcal:  2100-2300  Protein:  120-130g  Fluid:  2.1L/day  Clayton Bibles, MS, RD, LDN Inpatient Clinical Dietitian Contact information available via Amion

## 2020-10-22 NOTE — Progress Notes (Signed)
PROGRESS NOTE  Micheal Walls  DOB: January 04, 1936  PCP: [name], [place] XIP:[phone]  DOA: 10/21/2020  LOS: 0 days  Hospital Day: 2   Chief Complaint  Patient presents with  . Cellulitis   Brief narrative: Micheal Walls is a 85 y.o. male with PMH significant for DM2, HTN, PAD, right BKA status. Patient presented to the ED on 5/11 from home for swelling, redness and warmth of left lower leg. In April, patient underwent amputation of left first and second toes.  Home health was concerned with the amputation stump not healing well. In the ED, patient was afebrile, heart rate in 90s and low 100s, blood pressure elevated to 140s, breathing in room air. Labs with WC count elevated to 17.8, hemoglobin low at 9.7, platelet normal at 220, lactic acid level normal at 1.1, CRP elevated to 16.8, BUN/creatinine at 69/2.27, baseline creatinine 1.6 in April X-ray of the left foot showed soft tissue swelling of the forefoot and diffuse subcutaneous edema and no evidence of osteomyelitis, fracture or dislocation. Blood culture was sent.  Patient was started on IV antibiotics and admitted to hospitalist service. See below for details.  Subjective: Patient was seen and examined this afternoon. Pleasant elderly African-American male.  Lying on bed.  Not in distress. Chart reviewed Tachycardic in low 100s, blood pressure stable, Labs this morning with WBC count slightly down to 15.5, creatinine further elevated to 2.53  Assessment/Plan: Sepsis-POA Left foot cellulitis -His primary problem seems to be swelling, redness and tenderness in the lower half of the left leg.  Also suspected to have an infection at the amputation stump of left first and second toes -Met sepsis criteria with tachycardia, leukocytosis -Currently on IV Rocephin, IV Flagyl as well as linezolid.  Vancomycin allergy. 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 -Continue  aggressive broad-spectrum antibiotic coverage for now. -Hemodynamically stable at this time.  Continue maintenance IV fluid with NS at 75 mill per hour -May need podiatry Recent Labs  Lab 10/21/20 1847 10/22/20 0327  WBC 17.8* 15.5*  LATICACIDVEN 1.1  --    AKI on CKD 3b -Baseline creatinine close to 2.  Presented with elevated creatinine at 2.27, creatinine further worse at 2.53 today. -Expect improvement with IV fluid. -Currently Lasix on hold. Recent Labs    08/17/20 1413 08/27/20 1525 09/11/20 1800 09/12/20 0513 09/13/20 0129 09/14/20 0112 09/15/20 0517 09/28/20 1118 10/21/20 1847 10/22/20 0327  BUN 57* 64* 76* 63* 51* 44* 35* 58* 69* 65*  CREATININE 2.13* 1.85* 2.84* 2.26* 1.84* 1.69* 1.64* 2.01* 2.27* 2.53*   Type 2 diabetes mellitus -A1c 5.9 on 5/11 -Home meds include Ozempic -Continue the same.  Continue sliding scale insulin with Accu-Cheks. -Continue Neurontin for neuropathy Recent Labs  Lab 10/22/20 0725 10/22/20 1155  GLUCAP 120* 150*   Essential hypertension Diastolic CHF -Home meds include Cardizem, Lasix -Continue Cardizem.  Lasix on hold because of AKI  PAD, right BKA status Hyperlipidemia -Continue aspirin, Plavix, Crestor  BPH -Flomax  Chronic anemia -On iron supplement 3 days a week -Continue Protonix Recent Labs    01/23/20 1454 07/18/20 1413 09/14/20 0112 09/15/20 0517 09/28/20 1118 10/21/20 1847 10/22/20 0327  HGB 10.8*   < > 8.6* 9.0* 9.9* 9.7* 8.7*  MCV 91   < > 95.3 95.6 87 93.7 93.6  FERRITIN 434*  --   --   --   --   --   --   TIBC 202*  --   --   --   --   --   --  IRON 24*  --   --   --   --   --   --    < > = values in this interval not displayed.    Mobility: Mostly in wheelchair.  Gets PT at home. Code Status:   Code Status: Full Code  Nutritional status: Body mass index is 35.62 kg/m. Nutrition Problem: Increased nutrient needs Etiology: wound healing Signs/Symptoms: estimated needs Diet Order             Diet Carb Modified Fluid consistency: Thin; Room service appropriate? Yes  Diet effective now                 DVT prophylaxis: enoxaparin (LOVENOX) injection 40 mg Start: 10/21/20 2200   Antimicrobials:  IV Rocephin, Zyvox, Flagyl Fluid: Normal saline at 75 mill per hour Consultants: Podiatry Family Communication:  Not at bedside  Status is: Inpatient  Remains inpatient appropriate because: Continues to need IV antibiotics  Dispo: The patient is from: Home              Anticipated d/c is to: Home in 1 to 2 days              Patient currently is not medically stable to d/c.   Difficult to place patient No     Infusions:  . sodium chloride 75 mL/hr at 10/22/20 1122  . cefTRIAXone (ROCEPHIN)  IV 2 g (10/22/20 1124)  . linezolid (ZYVOX) IV 600 mg (10/22/20 1253)    Scheduled Meds: . allopurinol  100 mg Oral Daily  . aspirin EC  81 mg Oral Daily  . clopidogrel  75 mg Oral Daily  . diltiazem  300 mg Oral Daily  . dorzolamide  1 drop Both Eyes QHS  . enoxaparin (LOVENOX) injection  40 mg Subcutaneous Q24H  . [START ON 10/23/2020] ferrous sulfate  325 mg Oral Q breakfast  . gabapentin  200 mg Oral Daily   And  . gabapentin  100 mg Oral 2 times per day  . metroNIDAZOLE  500 mg Oral Q8H  . nutrition supplement (JUVEN)  1 packet Oral BID BM  . pantoprazole  40 mg Oral Daily  . protein supplement  1 Scoop Oral TID WC  . [START ON 10/28/2020] rosuvastatin  10 mg Oral Q Wed  . tamsulosin  0.4 mg Oral Daily    Antimicrobials: Anti-infectives (From admission, onward)   Start     Dose/Rate Route Frequency Ordered Stop   10/22/20 1000  cefTRIAXone (ROCEPHIN) 2 g in sodium chloride 0.9 % 100 mL IVPB        2 g 200 mL/hr over 30 Minutes Intravenous Every 24 hours 10/21/20 2142     10/22/20 1000  linezolid (ZYVOX) IVPB 600 mg        600 mg 300 mL/hr over 60 Minutes Intravenous Every 12 hours 10/21/20 2143     10/21/20 2200  metroNIDAZOLE (FLAGYL) tablet 500 mg        500 mg  Oral Every 8 hours 10/21/20 2142     10/21/20 2130  linezolid (ZYVOX) IVPB 600 mg        600 mg 300 mL/hr over 60 Minutes Intravenous  Once 10/21/20 2104 10/21/20 2222   10/21/20 2100  ceFEPIme (MAXIPIME) 2 g in sodium chloride 0.9 % 100 mL IVPB        2 g 200 mL/hr over 30 Minutes Intravenous  Once 10/21/20 2049 10/21/20 2122      PRN meds: acetaminophen **OR** acetaminophen,  ondansetron **OR** ondansetron (ZOFRAN) IV   Objective: Vitals:   10/22/20 0933 10/22/20 1342  BP: (!) 115/54 134/63  Pulse: 86 89  Resp: 16 20  Temp: 98.1 F (36.7 C) 98 F (36.7 C)  SpO2: 97% 99%    Intake/Output Summary (Last 24 hours) at 10/22/2020 1513 Last data filed at 10/22/2020 1000 Gross per 24 hour  Intake 593.32 ml  Output 1050 ml  Net -456.68 ml   Filed Weights   10/21/20 1840  Weight: 122.5 kg   Weight change:  Body mass index is 35.62 kg/m.   Physical Exam: General exam: Pleasant elderly African-American male.  Not in distress Skin: No rashes, lesions or ulcers. HEENT: Atraumatic, normocephalic, no obvious bleeding Lungs: Clear to auscultation bilaterally CVS: Regular rate and rhythm, no murmur GI/Abd soft, nontender, nondistended, bowel sound present CNS: Alert, awake, oriented x3.  Hard of hearing Psychiatry: Mood appropriate Extremities: Right BKA status, left leg with first and second toe amputation status.  Amputation site seems to be dry and crusted.  There is circumferential redness, swelling and warmth around the lower leg.  Data Review: I have personally reviewed the laboratory data and studies available.  Recent Labs  Lab 10/21/20 1847 10/22/20 0327  WBC 17.8* 15.5*  NEUTROABS 15.6*  --   HGB 9.7* 8.7*  HCT 31.4* 27.8*  MCV 93.7 93.6  PLT 220 192   Recent Labs  Lab 10/21/20 1847 10/22/20 0327  NA 134* 135  K 4.5 4.4  CL 99 102  CO2 27 32  GLUCOSE 129* 136*  BUN 69* 65*  CREATININE 2.27* 2.53*  CALCIUM 9.8 9.3    F/u labs ordered Unresulted  Labs (From admission, onward)          Start     Ordered   Unscheduled  CBC with Differential/Platelet  Daily,   R      10/22/20 1513   Unscheduled  Basic metabolic panel  Daily,   R      10/22/20 1513          Signed, Terrilee Croak, MD [facility] 10/22/2020

## 2020-10-22 NOTE — Consult Note (Signed)
PODIATRY CONSULTATION  NAME Micheal Walls MRN [id number] DOB May 03, 1936 DOA 10/21/2020   Reason for consult: s/p LT great toe amputation. Cellulitis LT leg Chief Complaint  Patient presents with  . Cellulitis    Consulting physician: Terrilee Croak MD  History of present illness: 85 y.o. male status post left great toe amputation 09/14/2020 admitted for cellulitis of the left leg.  Patient noticed increased swelling and edema with redness to the left leg and presented to the ED where he was admitted for cellulitis.  Patient has history of BKA RLE.  Podiatry consulted    Past Medical History:  Diagnosis Date  . Acute metabolic encephalopathy 01/15/6313  . Amputated below knee ([place])    right  . Anemia   . Aspiration pneumonia ([place]) 11/02/2019  . Diastolic heart failure ([place])   . Diverticulosis   . DM (diabetes mellitus) ([place])   . Gout   . Hyperlipemia   . OSA on CPAP   . RBBB   . Sepsis ([place]) 11/02/2019  . Systemic hypertension     CBC Latest Ref Rng & Units 10/22/2020 10/21/2020 09/28/2020  WBC 4.0 - 10.5 K/uL 15.5(H) 17.8(H) 7.4  Hemoglobin 13.0 - 17.0 g/dL 8.7(L) 9.7(L) 9.9(L)  Hematocrit 39.0 - 52.0 % 27.8(L) 31.4(L) 29.5(L)  Platelets 150 - 400 K/uL 192 220 288    BMP Latest Ref Rng & Units 10/22/2020 10/21/2020 09/28/2020  Glucose 70 - 99 mg/dL 136(H) 129(H) 105(H)  BUN 8 - 23 mg/dL 65(H) 69(H) 58(H)  Creatinine 0.61 - 1.24 mg/dL 2.53(H) 2.27(H) 2.01(H)  BUN/Creat Ratio 10 - 24 - - 29(H)  Sodium 135 - 145 mmol/L 135 134(L) 139  Potassium 3.5 - 5.1 mmol/L 4.4 4.5 4.6  Chloride 98 - 111 mmol/L 102 99 100  CO2 22 - 32 mmol/L 32 27 25  Calcium 8.9 - 10.3 mg/dL 9.3 9.8 10.6(H)        Physical Exam: General: The patient is alert and oriented x3 in no acute distress.   Amputation stump to the left foot actually appears very dry and stable.  There is no drainage to the amputation stump. The foot is actually cool to touch.  There is no fluctuance noted around the  foot.  Increased erythema with edema begins at the level of the ankle and extends proximal to the leg   ASSESSMENT/PLAN OF CARE S/p left hallux amputation 09/14/2020 -The amputation stump and area of dehiscence actually appears very healthy and stable.  There is no drainage.  There is no erythema around the amputation site.  The foot is cool to touch -No intervention warranted at this time.   Cellulitis left leg - Continue IV antibiotics.  Simply observe for now to see if the erythema and edema to the left leg decreases - Follow-up outpatient post discharge in office with Dr. Adair Laundry surgeon LT great toe amputation -Podiatry to sign off     Thank you for the consult.  Please contact me directly with any questions or concerns.  Cell [phone]   Edrick Kins, DPM [facility]  Dr. Edrick Kins, DPM    [address], [place] [address]  Office [phone]  Fax [phone]

## 2020-10-22 NOTE — TOC Initial Note (Signed)
Transition of Care [facility]) - Initial/Assessment Note   Patient Details  Name: Micheal Walls MRN: [id number] Date of Birth: Dec 11, 1935  Transition of [facility] CM/SW Contact:    Sherie Don, LCSW Phone Number: 10/22/2020, 2:16 PM  Clinical Narrative: Patient is an 85 year old male who is under observation for infection of amputation stump of left lower extremity. TOC received consult for HH/DME needs. CSW met with patient to complete assessment. Per patient, he resides at home with his wife and grandson. Patient reported he was independent at baseline with ADLs prior to his hospitalization. Patient has a rolling walker, cane, and wheelchair at home. Patient is active with [facility] for PT and RN; patient thinks he was recently discharged from OT. Patient uses [facility] to get to medical appointments. TOC to follow for discharge needs.  Expected Discharge Plan: [place] Barriers to Discharge: Continued Medical Work up  Patient Goals and CMS Choice Patient states their goals for this hospitalization and ongoing recovery are:: Discharge home with [facility] through [place] Medicare.gov Compare Post Acute Care list provided to:: Patient Choice offered to / list presented to : Patient  Expected Discharge Plan and Services Expected Discharge Plan: [place] In-house Referral: Clinical Social Work Post Acute Care Choice: [place] arrangements for the past 2 months: Single Family Home              DME Arranged: N/A DME Agency: NA HH Arranged: PT,RN [place]: [place] Date [place]: 10/22/20 Representative spoke with at [place]: Levada Dy  Prior Living Arrangements/Services Living arrangements for the past 2 months: Single Family Home Lives with:: Spouse Patient language and need for interpreter reviewed:: Yes Do you feel safe going back to the place where you live?: Yes      Need for Family Participation in Patient  Care: No (Comment) Care giver support system in place?: Yes (comment) Current home services: DME,Home PT,Home RN (Rolling walker, cane, wheelchair) Criminal Activity/Legal Involvement Pertinent to Current Situation/Hospitalization: No - Comment as needed  Activities of Daily Living Home Assistive Devices/Equipment: Eyeglasses,Wheelchair,Hearing aid ADL Screening (condition at time of admission) Patient's cognitive ability adequate to safely complete daily activities?: Yes Is the patient deaf or have difficulty hearing?: Yes Does the patient have difficulty seeing, even when wearing glasses/contacts?: No Does the patient have difficulty concentrating, remembering, or making decisions?: No Patient able to express need for assistance with ADLs?: Yes Does the patient have difficulty dressing or bathing?: No Independently performs ADLs?: Yes (appropriate for developmental age) Does the patient have difficulty walking or climbing stairs?: Yes Weakness of Legs: Both Weakness of Arms/Hands: None  Permission Sought/Granted Permission sought to share information with : Other (comment) Permission granted to share information with : Yes, Verbal Permission Granted Permission granted to share info w AGENCY: [facility]  Emotional Assessment Appearance:: Appears stated age Attitude/Demeanor/Rapport: Engaged Affect (typically observed): Accepting Orientation: : Oriented to Self,Oriented to Place,Oriented to  Time,Oriented to Situation Alcohol / Substance Use: Not Applicable Psych Involvement: No (comment)  Admission diagnosis:  SIRS (systemic inflammatory response syndrome) (HCC) [R65.10] Infection of amputation stump of left lower extremity (HCC) [T87.44] Cellulitis of left lower extremity [L03.116] Patient Active Problem List   Diagnosis Date Noted  . Infection of amputation stump of left lower extremity ([place]) 10/21/2020  . 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[place])   . Left ankle swelling   .  Diabetic foot infection ([place])   .  Cerebral thrombosis with cerebral infarction 07/20/2020  . Thrombocytopenia ([place]) 07/19/2020  . Cerebral ischemia 07/18/2020  . Bilateral hearing loss 05/20/2020  . Does use hearing aid 05/20/2020  . Diabetic ulcer of left great toe ([place]) 01/16/2020  . Controlled type 2 diabetes mellitus with stable proliferative retinopathy of both eyes, with long-term current use of insulin ([place]) 01/06/2020  . Intermediate stage nonexudative age-related macular degeneration of both eyes 01/06/2020  . Left epiretinal membrane 01/06/2020  . Dr[place] of right macula 01/06/2020  . AKI (acute kidney injury) ([place]) 11/02/2019  . Cellulitis 11/02/2019  . Elevated TSH 07/10/2018  . Nephropathy 02/28/2018  . Disturbance of skin sensation 02/28/2018  . PAD (peripheral artery disease) ([place]) 07/17/2017  . 110.1 10/21/2013  . Epiphora 04/24/2013  . Laxity of eyelid 04/24/2013  . Anemia 04/09/2013  . History of peripheral vascular disease 04/09/2013  . History of stroke 04/09/2013  . S/P unilateral BKA (below knee amputation) ([place]) 03/17/2013  . Diabetes mellitus type 2 in obese ([place]) 03/17/2013  . Mixed hyperlipidemia 03/17/2013  . Essential hypertension 03/17/2013  . Gout 03/17/2013  . CKD (chronic kidney disease), stage III ([place]) 03/17/2013  . OSA on CPAP 03/17/2013  . Anemia, iron deficiency 03/17/2013  . Diverticulosis of colon 03/17/2013  . RBBB 03/17/2013  . Punctal stenosis, acquired 10/02/2012   PCP:  [name], [name] Pharmacy:   [id number] [place] [address]Phone: [phone]: [phone] [facility] [place]- [address]Phone: [phone]: [phone] [facility] [place]- [address] [place]Phone: [phone]: [phone] Readmission  Risk Interventions Readmission Risk Prevention Plan 08/13/2020  Transportation Screening Complete  PCP or Specialist Appt within 3-5 Days Complete  HRI or Home Care Consult Complete  Social Work Consult for [place]/Counseling Complete  Palliative Care Screening Not Applicable  Medication Review (Press photographer Complete  Some recent data might be hidden

## 2020-10-22 NOTE — Plan of Care (Signed)
Plan of care initiated and reviewed with the patient. 

## 2020-10-23 LAB — CBC WITH DIFFERENTIAL/PLATELET
Abs Immature Granulocytes: 0.05 10*3/uL (ref 0.00–0.07)
Basophils Absolute: 0 10*3/uL (ref 0.0–0.1)
Basophils Relative: 0 %
Eosinophils Absolute: 0.3 10*3/uL (ref 0.0–0.5)
Eosinophils Relative: 3 %
HCT: 27 % — ABNORMAL LOW (ref 39.0–52.0)
Hemoglobin: 8.4 g/dL — ABNORMAL LOW (ref 13.0–17.0)
Immature Granulocytes: 0 %
Lymphocytes Relative: 9 %
Lymphs Abs: 1 10*3/uL (ref 0.7–4.0)
MCH: 29 pg (ref 26.0–34.0)
MCHC: 31.1 g/dL (ref 30.0–36.0)
MCV: 93.1 fL (ref 80.0–100.0)
Monocytes Absolute: 0.6 10*3/uL (ref 0.1–1.0)
Monocytes Relative: 5 %
Neutro Abs: 9.3 10*3/uL — ABNORMAL HIGH (ref 1.7–7.7)
Neutrophils Relative %: 83 %
Platelets: 183 10*3/uL (ref 150–400)
RBC: 2.9 MIL/uL — ABNORMAL LOW (ref 4.22–5.81)
RDW: 15.7 % — ABNORMAL HIGH (ref 11.5–15.5)
WBC: 11.2 10*3/uL — ABNORMAL HIGH (ref 4.0–10.5)
nRBC: 0 % (ref 0.0–0.2)

## 2020-10-23 LAB — BASIC METABOLIC PANEL
Anion gap: 5 (ref 5–15)
BUN: 55 mg/dL — ABNORMAL HIGH (ref 8–23)
CO2: 27 mmol/L (ref 22–32)
Calcium: 9.4 mg/dL (ref 8.9–10.3)
Chloride: 106 mmol/L (ref 98–111)
Creatinine, Ser: 1.83 mg/dL — ABNORMAL HIGH (ref 0.61–1.24)
GFR, Estimated: 36 mL/min — ABNORMAL LOW (ref >60)
Glucose, Bld: 128 mg/dL — ABNORMAL HIGH (ref 70–99)
Potassium: 4.1 mmol/L (ref 3.5–5.1)
Sodium: 138 mmol/L (ref 135–145)

## 2020-10-23 LAB — GLUCOSE, CAPILLARY
Glucose-Capillary: 117 mg/dL — ABNORMAL HIGH (ref 70–99)
Glucose-Capillary: 160 mg/dL — ABNORMAL HIGH (ref 70–99)
Glucose-Capillary: 172 mg/dL — ABNORMAL HIGH (ref 70–99)
Glucose-Capillary: 160 mg/dL — ABNORMAL HIGH (ref 70–99)

## 2020-10-23 MED ORDER — SACCHAROMYCES BOULARDII 250 MG PO CAPS
250.0000 mg | ORAL_CAPSULE | Freq: Two times a day (BID) | ORAL | Status: DC
  Administered 2020-10-23 – 2020-10-24 (×3): 250 mg via ORAL
  Filled 2020-10-23 (×4): qty 1

## 2020-10-23 MED ORDER — SODIUM CHLORIDE 0.9 % IV SOLN
2.0000 g | INTRAVENOUS | Status: DC
  Administered 2020-10-23 – 2020-10-24 (×2): 2 g via INTRAVENOUS
  Filled 2020-10-23 (×2): qty 2

## 2020-10-23 NOTE — Evaluation (Signed)
Physical Therapy Evaluation Patient Details Name: Micheal Walls MRN: [id number] DOB: Dec 27, 1935 Today's Date: 10/23/2020   History of Present Illness  85 y.o. male presented to the ED on 5/11 from home for swelling, redness and warmth of left lower leg.  In April, patient underwent amputation of left first and second toes. Pt with PMH significant for DM2, HTN, PAD, right BKA.  Clinical Impression  Pt admitted with above diagnosis. Stand pivot transfer to recliner with min/guard assist and RW. Pt was walking short distances only with HHPT prior to admission, he used WC for longer distances. Ambulation deferred today 2* mild dizziness in standing. Good progress expected.  Pt currently with functional limitations due to the deficits listed below (see PT Problem List). Pt will benefit from skilled PT to increase their independence and safety with mobility to allow discharge to the venue listed below.       Follow Up Recommendations Home health PT    Equipment Recommendations  None recommended by PT    Recommendations for Other Services       Precautions / Restrictions Precautions Precautions: Fall Precaution Comments: pt denies falls in past 6 months, R BKE prosthesis for transfers/walking Required Braces or Orthoses: Other Brace Other Brace: post op shoe on L Restrictions Weight Bearing Restrictions: No Other Position/Activity Restrictions: WBAT LLE per PT notes 09/16/20 from recent amputation      Mobility  Bed Mobility Overal bed mobility: Modified Independent Bed Mobility: Supine to Sit     Supine to sit: Modified independent (Device/Increase time);HOB elevated     General bed mobility comments: HOB up, used rail    Transfers Overall transfer level: Needs assistance Equipment used: Rolling walker (2 wheeled) Transfers: Sit to/from [employer] Sit to Stand: From elevated surface Stand pivot transfers: Min guard       General transfer comment:  increased time, pt reports mild dizziness in standing  Ambulation/Gait             General Gait Details: deferred 2* mild dizziness in standing  Stairs            Wheelchair Mobility    Modified Rankin (Stroke Patients Only)       Balance Overall balance assessment: Modified Independent                                           Pertinent Vitals/Pain Pain Assessment: No/denies pain    Home Living Family/patient expects to be discharged to:: Private residence Living Arrangements: Spouse/significant other Available Help at Discharge: Family;Available 24 hours/day Type of Home: House Home Access: Ramped entrance     Home Layout: One level Home Equipment: Wheelchair - Economist - standard;Wheelchair - power Additional Comments: pt lives with wife who can provide supervision and grandson who drives them to morning appts but then goes to work    Prior Function           Comments: Patient uses  power w/c for household  distances and manual w/c for community mobility. Can self-propel. Can ambulate short distances with HHPT with use of standard walker. Has not been putting much weight on LLE 2/2 diabetic ulcer.  Son helps with transportation. Does some IADLs.     Hand Dominance   Dominant Hand: Right    Extremity/Trunk Assessment   Upper Extremity Assessment Upper Extremity Assessment: Overall WFL for tasks assessed  Lower Extremity Assessment Lower Extremity Assessment: LLE deficits/detail LLE Deficits / Details: knee ext +4/5, pitting edema L foot, undressed amputation wounds, RN dressed wound prior to pt donning darco shoe LLE Sensation: history of peripheral neuropathy;decreased light touch    Cervical / Trunk Assessment Cervical / Trunk Assessment: Normal  Communication   Communication: HOH  Cognition Arousal/Alertness: Awake/alert Behavior During Therapy: WFL for tasks assessed/performed Overall Cognitive  Status: Within Functional Limits for tasks assessed                                        General Comments      Exercises     Assessment/Plan    PT Assessment Patient needs continued PT services  PT Problem List Decreased mobility;Decreased activity tolerance       PT Treatment Interventions Gait training;Therapeutic activities;Functional mobility training;Patient/family education    PT Goals (Current goals can be found in the Care Plan section)  Acute Rehab PT Goals Patient Stated Goal: resume HHPT, walk short distances PT Goal Formulation: With patient Time For Goal Achievement: 11/06/20 Potential to Achieve Goals: Good    Frequency Min 3X/week   Barriers to discharge        Co-evaluation               AM-PAC PT "6 Clicks" Mobility  Outcome Measure Help needed turning from your back to your side while in a flat bed without using bedrails?: A Little Help needed moving from lying on your back to sitting on the side of a flat bed without using bedrails?: A Lot Help needed moving to and from a bed to a chair (including a wheelchair)?: A Little Help needed standing up from a chair using your arms (e.g., wheelchair or bedside chair)?: A Little Help needed to walk in hospital room?: A Lot Help needed climbing 3-5 steps with a railing? : A Lot 6 Click Score: 15    End of Session Equipment Utilized During Treatment: Gait belt Activity Tolerance: Patient tolerated treatment well Patient left: in chair;with call bell/phone within reach;with chair alarm set Nurse Communication: Mobility status PT Visit Diagnosis: Difficulty in walking, not elsewhere classified (R26.2)    Time: 3704-8889 PT Time Calculation (min) (ACUTE ONLY): 23 min   Charges:   PT Evaluation $PT Eval Moderate Complexity: 1 Mod PT Treatments $Therapeutic Activity: 8-22 mins        Blondell Reveal Kistler PT 10/23/2020  Acute Rehabilitation Services Pager  [phone] Office [phone]

## 2020-10-23 NOTE — Consult Note (Signed)
   [facility] Inpatient Consult   10/23/2020  Micheal Walls 08/27/1935 [id number]   Patient chart reviewed due to high unplanned readmission risk score to assess for outpatient [place] chronic care management needs. Per review, patient is currently active with [place] [facility] for chronic care management services through primary MD office.    Of note, [name] Care Management services does not replace or interfere with any services that are arranged by inpatient case management or social work.  Plan: Will continue to follow for progression and disposition and update CCM team at [facility] of patient admission.  Netta Cedars, MSN, [facility] Liaison Nurse Mobile Phone [phone]  Toll free office [phone]

## 2020-10-23 NOTE — Plan of Care (Signed)
  Problem: Education: Goal: Knowledge of General Education information will improve Description: Including pain rating scale, medication(s)/side effects and non-pharmacologic comfort measures Outcome: Progressing   Problem: Nutrition: Goal: Adequate nutrition will be maintained Outcome: Progressing   Problem: Coping: Goal: Level of anxiety will decrease Outcome: Progressing   Problem: Elimination: Goal: Will not experience complications related to urinary retention Outcome: Progressing   Problem: Pain Managment: Goal: General experience of comfort will improve Outcome: Progressing   

## 2020-10-23 NOTE — Progress Notes (Signed)
PROGRESS NOTE  Micheal Walls  DOB: [birth year]  PCP: [name], [place] MCR:[id number]  DOA: 10/21/2020  LOS: 1 day  Hospital Day: 3   Chief Complaint  Patient presents with  . Cellulitis   Brief narrative: Micheal Walls is a 85 y.o. male with PMH significant for DM2, HTN, PAD, right BKA status. Patient presented to the ED on 5/11 from home for swelling, redness and warmth of left lower leg. In April, patient underwent amputation of left first and second toes.  Home health was concerned with the amputation stump not healing well. In the ED, patient was afebrile, heart rate in 90s and low 100s, blood pressure elevated to 140s, breathing in room air. Labs with WC count elevated to 17.8, hemoglobin low at 9.7, platelet normal at 220, lactic acid level normal at 1.1, CRP elevated to 16.8, BUN/creatinine at 69/2.27, baseline creatinine 1.6 in April X-ray of the left foot showed soft tissue swelling of the forefoot and diffuse subcutaneous edema and no evidence of osteomyelitis, fracture or dislocation. Blood culture was sent.  Patient was started on IV antibiotics and admitted to hospitalist service. See below for details.  Subjective: Patient was seen and examined this am.  Pleasant elderly African-American male.  Lying on bed.  Not in distress. Consult note from podiatry appreciated. Left leg cellulitis seem to be shrinking today.  Assessment/Plan: Sepsis secondary to left foot cellulitis -Swelling, redness and tenderness of the lower half of the left leg improving on broad-spectrum IV antibiotics coverage.   -Since he has very significant peripheral artery disease and high risk of decompensation, I would continue him on IV Rocephin 2 mg daily at this time.   -Sepsis parameters improving.  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  No erythema, drainage.  No intervention required at this time. -Follow-up with podiatry as an outpatient.  AKI on CKD 3b -Baseline creatinine close to 2.  Presented with elevated creatinine at 2.27, improved down back to baseline with IV hydration.  -Currently Lasix on hold.  Resume at discharge. Recent Labs    08/27/20 1525 09/11/20 1800 09/12/20 0513 09/13/20 0129 09/14/20 0112 09/15/20 0517 09/28/20 1118 10/21/20 1847 10/22/20 0327 10/23/20 0339  BUN 64* 76* 63* 51* 44* 35* 58* 69* 65* 55*  CREATININE 1.85* 2.84* 2.26* 1.84* 1.69* 1.64* 2.01* 2.27* 2.53* 1.83*   Type 2 diabetes mellitus -A1c 5.9 on 5/11 -Home meds include Ozempic -Continue the same.  Continue sliding scale insulin with Accu-Cheks. -Continue Neurontin for neuropathy Recent Labs  Lab 10/22/20 0725 10/22/20 1155 10/22/20 1653 10/22/20 2052 10/23/20 0759  GLUCAP 120* 150* 125* 143* 117*   Essential hypertension Diastolic CHF -Home meds include Cardizem, Lasix -Continue Cardizem.  Lasix on hold because of AKI.  Resume at discharge  PAD, right BKA status Hyperlipidemia -Continue aspirin, Plavix, Crestor  BPH -Flomax  Chronic anemia -On iron supplement 3 days a week -Continue Protonix Recent Labs    01/23/20 1454 07/18/20 1413 09/15/20 0517 09/28/20 1118 10/21/20 1847 10/22/20 0327 10/23/20 0339  HGB 10.8*   < > 9.0* 9.9* 9.7* 8.7* 8.4*  MCV 91   < > 95.6 87 93.7 93.6 93.1  FERRITIN 434*  --   --   --   --   --   --  TIBC 202*  --   --   --   --   --   --   IRON 24*  --   --   --   --   --   --    < > = values in this interval not displayed.    Mobility: Mostly in wheelchair.  PT eval pending Code Status:   Code Status: Full Code  Nutritional status: Body mass index is 35.62 kg/m. Nutrition Problem: Increased nutrient needs Etiology: wound healing Signs/Symptoms: estimated needs Diet Order             Diet Carb Modified Fluid consistency: Thin; Room service appropriate? Yes  Diet effective now                 DVT prophylaxis: enoxaparin (LOVENOX) injection 40 mg Start: 10/21/20 2200   Antimicrobials:  IV Rocephin Fluid: Okay to stop IV fluid today Consultants: Podiatry Family Communication:  Not at bedside  Status is: Inpatient  Remains inpatient appropriate because: Continues to need IV antibiotics   Dispo: The patient is from: Home              Anticipated d/c is to: Home likely tomorrow              Patient currently is not medically stable to d/c.   Difficult to place patient No     Infusions:  . cefTRIAXone (ROCEPHIN)  IV 2 g (10/23/20 0805)  . linezolid (ZYVOX) IV 600 mg (10/22/20 2233)    Scheduled Meds: . allopurinol  100 mg Oral Daily  . aspirin EC  81 mg Oral Daily  . clopidogrel  75 mg Oral Daily  . diltiazem  300 mg Oral Daily  . dorzolamide  1 drop Both Eyes QHS  . enoxaparin (LOVENOX) injection  40 mg Subcutaneous Q24H  . ferrous sulfate  325 mg Oral Q breakfast  . gabapentin  200 mg Oral Daily   And  . gabapentin  100 mg Oral 2 times per day  . nutrition supplement (JUVEN)  1 packet Oral BID BM  . pantoprazole  40 mg Oral Daily  . protein supplement  1 Scoop Oral TID WC  . [START ON 10/28/2020] rosuvastatin  10 mg Oral Q Wed  . saccharomyces boulardii  250 mg Oral BID  . tamsulosin  0.4 mg Oral Daily    Antimicrobials: Anti-infectives (From admission, onward)   Start     Dose/Rate Route Frequency Ordered Stop   10/23/20 0800  cefTRIAXone (ROCEPHIN) 2 g in sodium chloride 0.9 % 100 mL IVPB        2 g 200 mL/hr over 30 Minutes Intravenous Every 24 hours 10/23/20 0733     10/22/20 1000  cefTRIAXone (ROCEPHIN) 2 g in sodium chloride 0.9 % 100 mL IVPB  Status:  Discontinued        2 g 200 mL/hr over 30 Minutes Intravenous Every 24 hours 10/21/20 2142 10/23/20 0733   10/22/20 1000  linezolid (ZYVOX) IVPB 600 mg        600 mg 300 mL/hr over  60 Minutes Intravenous Every 12 hours 10/21/20 2143     10/21/20 2200  metroNIDAZOLE (FLAGYL) tablet 500 mg  Status:  Discontinued        500 mg Oral Every 8 hours 10/21/20 2142 10/23/20 0733   10/21/20 2130  linezolid (ZYVOX) IVPB 600 mg        600 mg 300 mL/hr over 60 Minutes Intravenous  Once 10/21/20  2104 10/21/20 2222   10/21/20 2100  ceFEPIme (MAXIPIME) 2 g in sodium chloride 0.9 % 100 mL IVPB        2 g 200 mL/hr over 30 Minutes Intravenous  Once 10/21/20 2049 10/21/20 2122      PRN meds: acetaminophen **OR** acetaminophen, ondansetron **OR** ondansetron (ZOFRAN) IV   Objective: Vitals:   10/22/20 2049 10/23/20 0553  BP: (!) 129/51 (!) 112/48  Pulse: 83 68  Resp: 18 18  Temp: 97.8 F (36.6 C) 98 F (36.7 C)  SpO2: 100% 99%    Intake/Output Summary (Last 24 hours) at 10/23/2020 1043 Last data filed at 10/23/2020 0940 Gross per 24 hour  Intake 2509.98 ml  Output 3000 ml  Net -490.02 ml   Filed Weights   10/21/20 1840  Weight: 122.5 kg   Weight change:  Body mass index is 35.62 kg/m.   Physical Exam: General exam: Pleasant elderly African-American male.  Not in distress Skin: No rashes, lesions or ulcers. HEENT: Atraumatic, normocephalic, no obvious bleeding Lungs: Clear to auscultate bilaterally CVS: Regular rate and rhythm, no murmur GI/Abd soft, nontender, nondistended, bowel sound present CNS: Alert, awake, oriented x3.  Hard of hearing Psychiatry: Mood appropriate Extremities: Right BKA status.left lower leg cellulitis area shrinking.  Left leg with first and second toe amputation status with healthy amputation site.  Data Review: I have personally reviewed the laboratory data and studies available.  Recent Labs  Lab 10/21/20 1847 10/22/20 0327 10/23/20 0339  WBC 17.8* 15.5* 11.2*  NEUTROABS 15.6*  --  9.3*  HGB 9.7* 8.7* 8.4*  HCT 31.4* 27.8* 27.0*  MCV 93.7 93.6 93.1  PLT 220 192 183   Recent Labs  Lab 10/21/20 1847 10/22/20 0327  10/23/20 0339  NA 134* 135 138  K 4.5 4.4 4.1  CL 99 102 106  CO2 27 32 27  GLUCOSE 129* 136* 128*  BUN 69* 65* 55*  CREATININE 2.27* 2.53* 1.83*  CALCIUM 9.8 9.3 9.4    F/u labs ordered Unresulted Labs (From admission, onward)          Start     Ordered   10/23/20 0500  CBC with Differential/Platelet  Daily,   R      10/22/20 1513   10/23/20 9735  Basic metabolic panel  Daily,   R      10/22/20 1513          Signed, Terrilee Croak, MD [facility] 10/23/2020

## 2020-10-24 DIAGNOSIS — R7989 Other specified abnormal findings of blood chemistry: Secondary | ICD-10-CM

## 2020-10-24 DIAGNOSIS — E1122 Type 2 diabetes mellitus with diabetic chronic kidney disease: Secondary | ICD-10-CM

## 2020-10-24 DIAGNOSIS — I739 Peripheral vascular disease, unspecified: Secondary | ICD-10-CM

## 2020-10-24 DIAGNOSIS — N189 Chronic kidney disease, unspecified: Secondary | ICD-10-CM

## 2020-10-24 DIAGNOSIS — R6520 Severe sepsis without septic shock: Secondary | ICD-10-CM

## 2020-10-24 DIAGNOSIS — D631 Anemia in chronic kidney disease: Secondary | ICD-10-CM

## 2020-10-24 DIAGNOSIS — I5032 Chronic diastolic (congestive) heart failure: Secondary | ICD-10-CM

## 2020-10-24 DIAGNOSIS — Z89511 Acquired absence of right leg below knee: Secondary | ICD-10-CM

## 2020-10-24 DIAGNOSIS — R5381 Other malaise: Secondary | ICD-10-CM

## 2020-10-24 DIAGNOSIS — A419 Sepsis, unspecified organism: Principal | ICD-10-CM

## 2020-10-24 LAB — CBC WITH DIFFERENTIAL/PLATELET
Abs Immature Granulocytes: 0.06 10*3/uL (ref 0.00–0.07)
Basophils Absolute: 0 10*3/uL (ref 0.0–0.1)
Basophils Relative: 0 %
Eosinophils Absolute: 0.3 10*3/uL (ref 0.0–0.5)
Eosinophils Relative: 4 %
HCT: 26.1 % — ABNORMAL LOW (ref 39.0–52.0)
Hemoglobin: 8.1 g/dL — ABNORMAL LOW (ref 13.0–17.0)
Immature Granulocytes: 1 %
Lymphocytes Relative: 14 %
Lymphs Abs: 1.2 10*3/uL (ref 0.7–4.0)
MCH: 28.9 pg (ref 26.0–34.0)
MCHC: 31 g/dL (ref 30.0–36.0)
MCV: 93.2 fL (ref 80.0–100.0)
Monocytes Absolute: 0.5 10*3/uL (ref 0.1–1.0)
Monocytes Relative: 6 %
Neutro Abs: 6.7 10*3/uL (ref 1.7–7.7)
Neutrophils Relative %: 75 %
Platelets: 206 10*3/uL (ref 150–400)
RBC: 2.8 MIL/uL — ABNORMAL LOW (ref 4.22–5.81)
RDW: 15.8 % — ABNORMAL HIGH (ref 11.5–15.5)
WBC: 8.8 10*3/uL (ref 4.0–10.5)
nRBC: 0 % (ref 0.0–0.2)

## 2020-10-24 LAB — GLUCOSE, CAPILLARY
Glucose-Capillary: 132 mg/dL — ABNORMAL HIGH (ref 70–99)
Glucose-Capillary: 133 mg/dL — ABNORMAL HIGH (ref 70–99)

## 2020-10-24 LAB — BASIC METABOLIC PANEL
Anion gap: 6 (ref 5–15)
BUN: 65 mg/dL — ABNORMAL HIGH (ref 8–23)
CO2: 25 mmol/L (ref 22–32)
Calcium: 9.2 mg/dL (ref 8.9–10.3)
Chloride: 103 mmol/L (ref 98–111)
Creatinine, Ser: 1.9 mg/dL — ABNORMAL HIGH (ref 0.61–1.24)
GFR, Estimated: 35 mL/min — ABNORMAL LOW (ref >60)
Glucose, Bld: 160 mg/dL — ABNORMAL HIGH (ref 70–99)
Potassium: 4.1 mmol/L (ref 3.5–5.1)
Sodium: 134 mmol/L — ABNORMAL LOW (ref 135–145)

## 2020-10-24 MED ORDER — LINEZOLID 600 MG PO TABS: 600.0000 mg | ORAL_TABLET | Freq: Two times a day (BID) | ORAL | 0 refills | Status: DC

## 2020-10-24 MED ORDER — CEFDINIR 300 MG PO CAPS
300.0000 mg | ORAL_CAPSULE | Freq: Two times a day (BID) | ORAL | 0 refills | Status: DC
Start: 2020-10-24 — End: 2020-11-12

## 2020-10-24 MED ORDER — SENNOSIDES-DOCUSATE SODIUM 8.6-50 MG PO TABS: 1.0000 | ORAL_TABLET | Freq: Two times a day (BID) | ORAL | 0 refills | Status: AC | PRN

## 2020-10-24 MED ORDER — FUROSEMIDE 40 MG PO TABS: ORAL_TABLET | ORAL | 0 refills | Status: DC

## 2020-10-24 NOTE — Progress Notes (Signed)
Pt stable at this time. Pt left foot and ankle wrapped for patient prior to discharge. Pt questions addressed with d/c instructions and education. Pt awaiting ride. Rn will continue to monitor.

## 2020-10-24 NOTE — Plan of Care (Signed)
  Problem: Pain Managment: Goal: General experience of comfort will improve Outcome: Progressing   Problem: Safety: Goal: Ability to remain free from injury will improve Outcome: Progressing   

## 2020-10-24 NOTE — Progress Notes (Signed)
Physical Therapy Treatment Patient Details Name: Micheal Walls MRN: [id number] DOB: [birth year] Today's Date: 10/24/2020    History of Present Illness 85 y.o. male presented to the ED on 5/11 from home for swelling, redness and warmth of left lower leg.  In April, patient underwent amputation of left first and second toes. Pt with PMH significant for DM2, HTN, PAD, right BKA.    PT Comments    Pt assisted with ambulating short distance and requiring min assist for mobility at this time.  Pt typically uses w/c at home and scoots into w/c.  Continue to recommend HHPT.   Follow Up Recommendations  Home health PT     Equipment Recommendations  None recommended by PT    Recommendations for Other Services       Precautions / Restrictions Precautions Precautions: Fall Precaution Comments: pt denies falls in past 6 months, R BKA prosthesis for transfers/walking Required Braces or Orthoses: Other Brace Other Brace: post op shoe on L Restrictions Other Position/Activity Restrictions: WBAT LLE per PT notes 09/16/20 from recent amputation    Mobility  Bed Mobility Overal bed mobility: Modified Independent                  Transfers Overall transfer level: Needs assistance Equipment used: Rolling walker (2 wheeled) Transfers: Sit to/from Stand Sit to Stand: From elevated surface Stand pivot transfers: Min assist       General transfer comment: assist to rise and steady, wide BOS  Ambulation/Gait Ambulation/Gait assistance: Min assist Gait Distance (Feet): 36 Feet Assistive device: Rolling walker (2 wheeled) Gait Pattern/deviations: Step-to pattern;Decreased stance time - left;Antalgic     General Gait Details: verbal cues for step length and RW positioning, no dizziness today   Stairs             Wheelchair Mobility    Modified Rankin (Stroke Patients Only)       Balance                                            Cognition  Arousal/Alertness: Awake/alert Behavior During Therapy: WFL for tasks assessed/performed Overall Cognitive Status: Within Functional Limits for tasks assessed                                        Exercises      General Comments        Pertinent Vitals/Pain Pain Assessment: No/denies pain    Home Living                      Prior Function            PT Goals (current goals can now be found in the care plan section) Progress towards PT goals: Progressing toward goals    Frequency    Min 3X/week      PT Plan Current plan remains appropriate    Co-evaluation              AM-PAC PT "6 Clicks" Mobility   Outcome Measure  Help needed turning from your back to your side while in a flat bed without using bedrails?: A Little Help needed moving from lying on your back to sitting on the side of a flat bed without using bedrails?: A Little  Help needed moving to and from a bed to a chair (including a wheelchair)?: A Little Help needed standing up from a chair using your arms (e.g., wheelchair or bedside chair)?: A Little Help needed to walk in hospital room?: A Lot Help needed climbing 3-5 steps with a railing? : A Lot 6 Click Score: 16    End of Session Equipment Utilized During Treatment: Gait belt Activity Tolerance: Patient tolerated treatment well Patient left: in chair;with call bell/phone within reach;with chair alarm set   PT Visit Diagnosis: Difficulty in walking, not elsewhere classified (R26.2)     Time: 3391-7921 PT Time Calculation (min) (ACUTE ONLY): 17 min  Charges:  $Gait Training: 8-22 mins                    Arlyce Dice, DPT Acute Rehabilitation Services Pager: [phone] Office: [employer] [facility] 10/24/2020, 2:00 PM

## 2020-10-24 NOTE — Discharge Summary (Signed)
Physician Discharge Summary  Micheal Walls EYC:[id number] DOB: 1935/09/29 DOA: 10/21/2020  PCP: [name], [name]  Admit date: 10/21/2020 Discharge date: 10/24/2020  Admitted From: Home Disposition: Home  Recommendations for Outpatient Follow-up:  1. Follow ups as below. 2. Please obtain CBC/BMP/Mag at follow up 3. Please follow up on the following pending results: None  Home Health: PT/OT Equipment/Devices: Patient has appropriate DME's  Discharge Condition: Stable CODE STATUS: Full code   Follow-up Information    [name], [place]. Schedule an appointment as soon as possible for a visit in 1 week(s).   Specialty: General Practice Contact information: [address] [place] [address] [phone]        [name], [name], MD. Schedule an appointment as soon as possible for a visit in 2 week(s).   Specialty: Cardiology Contact information: [address] [place] [address] [phone]                Hospital Course: 85 year old M with PMH of IDDM-2, HTN, PAD, right BKA and recent left first and second toe amputation in April 2022 presenting with left foot swelling, redness and increased warmth to touch and admitted for sepsis secondary to left foot cellulitis.  He had leukocytosis to 17.8.  He was tachycardic and tachypneic.  He also had AKI on CKD.     Discharge Diagnoses:  Severe sepsis due to left foot cellulitis and patient with recent left first and second toe amputation-failed outpatient treatment with p.o. Augmentin and doxycycline.  Meets severe sepsis criteria with leukocytosis, tachypnea, tachycardia and AKI on presentation.  Blood cultures negative.  Treated with IV Zyvox and IV ceftriaxone.  Sepsis physiology and cellulitis improved.  Evaluated by podiatry who recommended antibiotics and outpatient follow-up with his surgeon.  He was discharged on p.o. Zyvox and p.o. cefdinir for 7 more days.  Home health PT/OT  ordered.  Patient has walker at home.  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  AKI seems to have resolved. Recent Labs    09/11/20 1800 09/12/20 0513 09/13/20 0129 09/14/20 0112 09/15/20 0517 09/28/20 1118 10/21/20 1847 10/22/20 0327 10/23/20 0339 10/24/20 0325  BUN 76* 63* 51* 44* 35* 58* 69* 65* 55* 65*  CREATININE 2.84* 2.26* 1.84* 1.69* 1.64* 2.01* 2.27* 2.53* 1.83* 1.90*  -Recheck renal function at follow-up -Hold Lasix for 4 more days  Controlled IDDM-2 with hyperglycemia, CKD and neuropathy: A1c 5.9%. Recent Labs  Lab 10/23/20 1135 10/23/20 1650 10/23/20 2114 10/24/20 0745 10/24/20 1150  GLUCAP 160* 172* 160* 132* 133*  -Continue home medications  Essential hypertension: Normotensive Chronic diastolic CHF: TTE in 10/8848 with LVEF of 55 to 60% moderate LVH and indeterminate DD.  Appears euvolemic on exam.  No respiratory distress. -Advised to hold his Lasix for 4 more days in the setting of AKI -Could benefit from [place] on sodium and fluid restriction as well as daily weight  PAD, right BKA status Hyperlipidemia -Continue aspirin, Plavix, Crestor  BPH -Flomax  Chronic anemia/normocytic anemia/anemia of renal disease -On iron supplement 3 days a week -Continue Protonix  Debility/physical deconditioning -Home health PT/OT ordered.  Patient has walker at home  Morbid obesity: Meets criteria with BMI and diabetes Body mass index is 35.62 kg/m. Nutrition Problem: Increased nutrient needs Etiology: wound healing Signs/Symptoms: estimated needs Interventions: Juven,Refer to RD note for recommendations,Magic cup      Discharge Exam: Vitals:   10/23/20 2100 10/24/20 0639  BP: 129/61 (!) 122/56  Pulse:  75 72  Resp: 20 18  Temp: 98.3 F (36.8 C) 98.2 F (36.8 C)  SpO2: 96% 98%    GENERAL: No apparent distress.  Nontoxic. HEENT: MMM.  Vision and hearing grossly intact.  NECK: Supple.  No apparent JVD.  RESP:  No  IWOB.  Fair aeration bilaterally. CVS:  RRR. Heart sounds normal.  ABD/GI/GU: Bowel sounds present. Soft. Non tender.  MSK/EXT:  Moves extremities. S/p right BKA and left first and second toe amputation SKIN: no apparent skin lesion or wound NEURO: Awake, alert and oriented appropriately.  No apparent focal neuro deficit. PSYCH: Calm. Normal affect.       Discharge Instructions  Discharge Instructions    (HEART FAILURE PATIENTS) Call MD:  Anytime you have any of the following symptoms: 1) 3 pound weight gain in 24 hours or 5 pounds in 1 week 2) shortness of breath, with or without a dry hacking cough 3) swelling in the hands, feet or stomach 4) if you have to sleep on extra pillows at night in order to breathe.   Complete by: As directed    Call MD for:  difficulty breathing, headache or visual disturbances   Complete by: As directed    Call MD for:  persistant dizziness or light-headedness   Complete by: As directed    Call MD for:  redness, tenderness, or signs of infection (pain, swelling, redness, odor or green/yellow discharge around incision site)   Complete by: As directed    Call MD for:  severe uncontrolled pain   Complete by: As directed    Call MD for:  temperature >100.4   Complete by: As directed    Diet - low sodium heart healthy   Complete by: As directed    Diet Carb Modified   Complete by: As directed    Discharge instructions   Complete by: As directed    It has been a pleasure taking care of you!  You were hospitalized left foot swelling, redness and warmth that has improved with antibiotics.  We are discharging you more of these antibiotics to complete treatment course.  We also recommend holding your fluid medication (Lasix) for the next 4 to 5 days to make sure your kidney number improves.  Please follow-up with your primary care doctor and podiatrist in about 1 week.  Please review your new medication list and the directions on your medications before you  take them.   Take care,   Increase activity slowly   Complete by: As directed    No wound care   Complete by: As directed      Allergies as of 10/24/2020      Reactions   Vancomycin Rash      Medication List    STOP taking these medications   amoxicillin-clavulanate 875-125 MG tablet Commonly known as: AUGMENTIN   Biotin 5000 MCG Caps   doxycycline 100 MG tablet Commonly known as: VIBRA-TABS   fish oil-omega-3 fatty acids 1000 MG capsule   Magnesium 250 MG Tabs   magnesium citrate Soln   magnesium hydroxide 400 MG/5ML suspension Commonly known as: MILK OF MAGNESIA   multivitamin with minerals Tabs tablet     TAKE these medications   allopurinol 100 MG tablet Commonly known as: ZYLOPRIM Take 100 mg by mouth daily.   aspirin EC 81 MG tablet Take 81 mg by mouth daily. Swallow whole.   cefdinir 300 MG capsule Commonly known as: OMNICEF Take 1 capsule (300 mg total) by mouth 2 (  two) times daily.   clopidogrel 75 MG tablet Commonly known as: PLAVIX Take 1 tablet (75 mg total) by mouth daily.   diltiazem 300 MG 24 hr capsule Commonly known as: CARDIZEM CD Take 300 mg by mouth daily.   dorzolamide 2 % ophthalmic solution Commonly known as: TRUSOPT Place 1 drop into both eyes at bedtime.   ferrous sulfate 325 (65 FE) MG tablet Take 325-650 mg by mouth See admin instructions. 325mg  on Monday Wednesday Friday 650mg  on Sunday Tuesday Thursday saturday   FreeStyle Libre 14 Day Sensor Misc Use as directed to check blood sugars   FreeStyle Precision Neo Test test strip Generic drug: glucose blood Use as instructed   furosemide 40 MG tablet Commonly known as: LASIX Take 2 tabs in morning and 1 tab evening Start taking on: Oct 28, 2020 What changed: These instructions start on Oct 28, 2020. If you are unsure what to do until then, ask your doctor or other care provider.   gabapentin 100 MG capsule Commonly known as: NEURONTIN Take 2 capsules in am 1  capsule mid day and 1 capsule in evening What changed:   how much to take  how to take this  when to take this  additional instructions   latanoprost 0.005 % ophthalmic solution Commonly known as: XALATAN Place 1 drop into both eyes at bedtime.   linezolid 600 MG tablet Commonly known as: ZYVOX Take 1 tablet (600 mg total) by mouth 2 (two) times daily.   Ozempic ([name]) 2 MG/1.5ML Sopn Generic drug: Semaglutide ([name]) Inject 1 mg into the skin once a week. What changed: when to take this   pantoprazole 40 MG tablet Commonly known as: PROTONIX Take 1 tablet (40 mg total) by mouth daily.   PRESERVISION AREDS PO Take 1 tablet by mouth daily.   rosuvastatin 10 MG tablet Commonly known as: CRESTOR Take 1 tablet (10 mg total) by mouth daily. What changed:   when to take this  additional instructions   senna-docusate 8.6-50 MG tablet Commonly known as: Senokot-S Take 1 tablet by mouth 2 (two) times daily between meals as needed for mild constipation or moderate constipation.   tamsulosin 0.4 MG Caps capsule Commonly known as: FLOMAX Take 0.4 mg by mouth daily.       Consultations:  Podiatry  Procedures/Studies:   DG Chest 2 View  Result Date: 10/21/2020 CLINICAL DATA:  85 year old male with sepsis. EXAM: CHEST - 2 VIEW COMPARISON:  Chest radiograph dated 08/09/2020. FINDINGS: Small right lung base atelectasis. Infiltrate is less likely but not excluded clinical correlation is recommended. There is no pleural effusion pneumothorax. The cardiac silhouette is within limits. No acute osseous pathology. IMPRESSION: Small right lung base atelectasis versus less likely infiltrate. Electronically Signed   By: Anner Crete M.D.   On: 10/21/2020 19:31   DG Foot 2 Views Left  Result Date: 10/21/2020 CLINICAL DATA:  85 year old male status post after a shin of the great toe. Evaluate for possibility of osteomyelitis. EXAM: LEFT FOOT - 2 VIEW COMPARISON:  Left  foot radiograph dated 09/14/2020. FINDINGS: Status post prior amputation of the first MTP joint. There is no acute fracture or dislocation. The bones are osteopenic. No bone erosion or periosteal reaction to suggest acute osteomyelitis. Vascular calcifications noted. Mild soft tissue swelling of the forefoot and diffuse subcutaneous edema. No radiopaque foreign object or soft tissue gas. IMPRESSION: 1. Amputation of the first MTP joint. No acute fracture or dislocation. 2. No radiographic findings of  acute osteomyelitis. 3. Soft tissue swelling of the forefoot and diffuse subcutaneous edema. Electronically Signed   By: Anner Crete M.D.   On: 10/21/2020 19:58        The results of significant diagnostics from this hospitalization (including imaging, microbiology, ancillary and laboratory) are listed below for reference.     Microbiology: Recent Results (from the past 240 hour(s))  Culture, blood (Routine x 2)     Status: None (Preliminary result)   Collection Time: 10/21/20  6:49 PM   Specimen: Right Antecubital; Blood  Result Value Ref Range Status   Specimen Description   Final    RIGHT ANTECUBITAL Performed at [facility] [address] [place], [place] [address]    Special Requests   Final    Blood Culture adequate volume BOTTLES DRAWN AEROBIC AND ANAEROBIC   Culture   Final    NO GROWTH 2 DAYS Performed at [facility], [place] [address], [place], [place] [address]    Report Status PENDING  Incomplete  Culture, blood (Routine x 2)     Status: None (Preliminary result)   Collection Time: 10/21/20  6:56 PM   Specimen: Right Antecubital; Blood  Result Value Ref Range Status   Specimen Description   Final    RIGHT ANTECUBITAL Performed at [facility] [address] [place], [place] [address]    Special Requests   Final    Blood Culture adequate volume BOTTLES DRAWN AEROBIC AND ANAEROBIC   Culture   Final    NO GROWTH 2  DAYS Performed at [facility], [place] [address], [place], [place] [address]    Report Status PENDING  Incomplete  SARS CORONAVIRUS 2 (TAT 6-24 HRS) Nasopharyngeal Nasopharyngeal Swab     Status: None   Collection Time: 10/21/20  9:00 PM   Specimen: Nasopharyngeal Swab  Result Value Ref Range Status   SARS Coronavirus 2 NEGATIVE NEGATIVE Final    Comment: (NOTE) SARS-CoV-2 target nucleic acids are NOT DETECTED.  The SARS-CoV-2 RNA is generally detectable in upper and lower respiratory specimens during the acute phase of infection. Negative results do not preclude SARS-CoV-2 infection, do not rule out co-infections with other pathogens, and should not be used as the sole basis for treatment or other patient management decisions. Negative results must be combined with clinical observations, patient history, and epidemiological information. The expected result is Negative.  Fact Sheet for Patients: SugarRoll.be  Fact Sheet for Healthcare Providers: https://www.woods-mathews.com/  This test is not yet approved or cleared by the [place] FDA and  has been authorized for detection and/or diagnosis of SARS-CoV-2 by FDA under an Emergency Use Authorization (EUA). This EUA will remain  in effect (meaning this test can be used) for the duration of the COVID-19 declaration under Se ction 564(b)(1) of the Act, 21 U.S.C. section 360bbb-3(b)(1), unless the authorization is terminated or revoked sooner.  Performed at [facility], [place] [address] [place], [place] [address]   MRSA PCR Screening     Status: None   Collection Time: 10/21/20  9:45 PM   Specimen: Nasopharyngeal  Result Value Ref Range Status   MRSA by PCR NEGATIVE NEGATIVE Final    Comment:        The GeneXpert MRSA Assay (FDA approved for NASAL specimens only), is one component of a comprehensive MRSA colonization surveillance program. It is not intended to diagnose  MRSA infection nor to guide or monitor treatment for MRSA infections.  Performed at [facility], [place] [address], [place], [place] [address]      Labs:  CBC: Recent Labs  Lab 10/21/20 1847 10/22/20 0327 10/23/20 0339 10/24/20 0325  WBC 17.8* 15.5* 11.2* 8.8  NEUTROABS 15.6*  --  9.3* 6.7  HGB 9.7* 8.7* 8.4* 8.1*  HCT 31.4* 27.8* 27.0* 26.1*  MCV 93.7 93.6 93.1 93.2  PLT 220 192 183 206   BMP &GFR Recent Labs  Lab 10/21/20 1847 10/22/20 0327 10/23/20 0339 10/24/20 0325  NA 134* 135 138 134*  K 4.5 4.4 4.1 4.1  CL 99 102 106 103  CO2 27 32 27 25  GLUCOSE 129* 136* 128* 160*  BUN 69* 65* 55* 65*  CREATININE 2.27* 2.53* 1.83* 1.90*  CALCIUM 9.8 9.3 9.4 9.2   Estimated Creatinine Clearance: 40.4 mL/min (A) (by C-G formula based on SCr of 1.9 mg/dL (H)). Liver & Pancreas: Recent Labs  Lab 10/21/20 1847  AST 21  ALT 22  ALKPHOS 68  BILITOT 0.4  PROT 7.6  ALBUMIN 3.3*   No results for input(s): LIPASE, AMYLASE in the last 168 hours. No results for input(s): AMMONIA in the last 168 hours. Diabetic: Recent Labs    10/21/20 2138  HGBA1C 5.9*   Recent Labs  Lab 10/23/20 1135 10/23/20 1650 10/23/20 2114 10/24/20 0745 10/24/20 1150  GLUCAP 160* 172* 160* 132* 133*   Cardiac Enzymes: No results for input(s): CKTOTAL, CKMB, CKMBINDEX, TROPONINI in the last 168 hours. No results for input(s): PROBNP in the last 8760 hours. Coagulation Profile: Recent Labs  Lab 10/21/20 1847  INR 1.1   Thyroid Function Tests: No results for input(s): TSH, T4TOTAL, FREET4, T3FREE, THYROIDAB in the last 72 hours. Lipid Profile: No results for input(s): CHOL, HDL, LDLCALC, TRIG, CHOLHDL, LDLDIRECT in the last 72 hours. Anemia Panel: No results for input(s): VITAMINB12, FOLATE, FERRITIN, TIBC, IRON, RETICCTPCT in the last 72 hours. Urine analysis:    Component Value Date/Time   COLORURINE YELLOW 10/22/2020 0300   APPEARANCEUR CLEAR 10/22/2020 0300    LABSPEC 1.008 10/22/2020 0300   PHURINE 8.0 10/22/2020 0300   GLUCOSEU NEGATIVE 10/22/2020 0300   HGBUR NEGATIVE 10/22/2020 0300   BILIRUBINUR NEGATIVE 10/22/2020 0300   BILIRUBINUR negative 09/28/2020 1558   KETONESUR NEGATIVE 10/22/2020 0300   PROTEINUR 30 (A) 10/22/2020 0300   UROBILINOGEN 0.2 09/28/2020 1558   UROBILINOGEN 0.2 01/08/2011 1558   NITRITE NEGATIVE 10/22/2020 0300   LEUKOCYTESUR NEGATIVE 10/22/2020 0300   Sepsis Labs: Invalid input(s): PROCALCITONIN, LACTICIDVEN   Time coordinating discharge: 40 minutes  SIGNED:  Mercy Riding, MD  [facility] 10/24/2020, 9:01 PM  If 7PM-7AM, please contact night-coverage [email or web address]

## 2020-10-26 ENCOUNTER — Telehealth: Payer: Self-pay

## 2020-10-26 NOTE — Telephone Encounter (Signed)
  Care Management    Consult Note  10/26/2020 Name: Micheal Walls MRN: [id number] DOB: 1935/12/29  Care management team received notification of patient's recent inpatient hospitalization related to SIRS .Based on review of health record, Micheal Walls is currently active in the embedded care coordination program.   Review of patient status, including review of consultants reports, relevant laboratory and other test results, and collaboration with appropriate care team members and the patient's provider was performed as part of comprehensive patient evaluation and provision of chronic care management services.    Plan: Collaboration with RN Care Manager and PharmD to inform of recent inpatient stay. A member of the care management team will contact the patient over the next month.  [name], [name], [name], Certified Dementia Practitioner [facility] [phone]

## 2020-10-26 NOTE — Telephone Encounter (Signed)
Transition Care Management Follow-up Telephone Call  Date of discharge and from where: 10/24/2020, [facility]   How have you been since you were released from the hospital? Patient states that he is feeling better.   Any questions or concerns? No  Items Reviewed:  Did the pt receive and understand the discharge instructions provided? Yes   Medications obtained and verified? Yes   Other? No   Any new allergies since your discharge? No   Dietary orders reviewed? Yes  Do you have support at home? Yes   Home Care and Equipment/Supplies: Were home health services ordered? yes If so, what is the name of the agency? unknown  Has the agency set up a time to come to the patient's home? yes Were any new equipment or medical supplies ordered?  No What is the name of the medical supply agency? N/A Were you able to get the supplies/equipment? not applicable Do you have any questions related to the use of the equipment or supplies? No  Functional Questionnaire: (I = Independent and D = Dependent) ADLs: I  Bathing/Dressing- I  Meal Prep- I  Eating- I  Maintaining continence- I  Transferring/Ambulation- I  Managing Meds- I  Follow up appointments reviewed:   PCP Hospital f/u appt confirmed? No  Patient states that he does not have transportation and home visits are always completed for him. Advised I would send this to PCP and office will schedule home visit if provider does them.   [facility] f/u appt confirmed? Yes  Scheduled to see cardiology on 11/12/2020 @ 9:15 am.  Are transportation arrangements needed? Yes   If their condition worsens, is the pt aware to call PCP or go to the Emergency Dept.? Yes  Was the patient provided with contact information for the PCP's office or ED? Yes  Was to pt encouraged to call back with questions or concerns? Yes

## 2020-10-27 ENCOUNTER — Telehealth: Payer: Medicare Other

## 2020-10-27 ENCOUNTER — Encounter: Payer: Self-pay | Admitting: Nurse Practitioner

## 2020-10-27 ENCOUNTER — Ambulatory Visit: Payer: Self-pay

## 2020-10-27 DIAGNOSIS — G629 Polyneuropathy, unspecified: Secondary | ICD-10-CM

## 2020-10-27 DIAGNOSIS — M869 Osteomyelitis, unspecified: Secondary | ICD-10-CM | POA: Diagnosis not present

## 2020-10-27 DIAGNOSIS — E782 Mixed hyperlipidemia: Secondary | ICD-10-CM

## 2020-10-27 DIAGNOSIS — L03116 Cellulitis of left lower limb: Secondary | ICD-10-CM

## 2020-10-27 DIAGNOSIS — Z794 Long term (current) use of insulin: Secondary | ICD-10-CM

## 2020-10-27 DIAGNOSIS — I10 Essential (primary) hypertension: Secondary | ICD-10-CM

## 2020-10-27 DIAGNOSIS — E1169 Type 2 diabetes mellitus with other specified complication: Secondary | ICD-10-CM | POA: Diagnosis not present

## 2020-10-27 DIAGNOSIS — I69398 Other sequelae of cerebral infarction: Secondary | ICD-10-CM | POA: Diagnosis not present

## 2020-10-27 DIAGNOSIS — Z89412 Acquired absence of left great toe: Secondary | ICD-10-CM | POA: Diagnosis not present

## 2020-10-27 DIAGNOSIS — E1159 Type 2 diabetes mellitus with other circulatory complications: Secondary | ICD-10-CM

## 2020-10-27 DIAGNOSIS — Z4781 Encounter for orthopedic aftercare following surgical amputation: Secondary | ICD-10-CM | POA: Diagnosis not present

## 2020-10-27 DIAGNOSIS — I739 Peripheral vascular disease, unspecified: Secondary | ICD-10-CM

## 2020-10-27 DIAGNOSIS — Z4801 Encounter for change or removal of surgical wound dressing: Secondary | ICD-10-CM | POA: Diagnosis not present

## 2020-10-27 LAB — CULTURE, BLOOD (ROUTINE X 2)
Culture: NO GROWTH
Culture: NO GROWTH
Special Requests: ADEQUATE
Special Requests: ADEQUATE

## 2020-10-28 ENCOUNTER — Telehealth: Payer: Self-pay

## 2020-10-28 DIAGNOSIS — M869 Osteomyelitis, unspecified: Secondary | ICD-10-CM | POA: Diagnosis not present

## 2020-10-28 DIAGNOSIS — Z4781 Encounter for orthopedic aftercare following surgical amputation: Secondary | ICD-10-CM | POA: Diagnosis not present

## 2020-10-28 DIAGNOSIS — Z89412 Acquired absence of left great toe: Secondary | ICD-10-CM | POA: Diagnosis not present

## 2020-10-28 DIAGNOSIS — Z4801 Encounter for change or removal of surgical wound dressing: Secondary | ICD-10-CM | POA: Diagnosis not present

## 2020-10-28 DIAGNOSIS — E1169 Type 2 diabetes mellitus with other specified complication: Secondary | ICD-10-CM | POA: Diagnosis not present

## 2020-10-28 DIAGNOSIS — I69398 Other sequelae of cerebral infarction: Secondary | ICD-10-CM | POA: Diagnosis not present

## 2020-10-28 NOTE — Chronic Care Management (AMB) (Signed)
Chronic Care Management   CCM RN Visit Note  10/27/2020 Name: Micheal Walls MRN: [id number] DOB: [birth year]  Subjective: Micheal Walls is a 85 y.o. year old male who is a primary care patient of [name], [place]. The care management team was consulted for assistance with disease management and care coordination needs.    Engaged with patient by telephone for follow up visit in response to provider referral for case management and/or care coordination services.   Consent to Services:  The patient was given information about Chronic Care Management services, agreed to services, and gave verbal consent prior to initiation of services.  Please see initial visit note for detailed documentation.   Patient agreed to services and verbal consent obtained.   Assessment: Review of patient past medical history, allergies, medications, health status, including review of consultants reports, laboratory and other test data, was performed as part of comprehensive evaluation and provision of chronic care management services.   SDOH (Social Determinants of Health) assessments and interventions performed:  yes, no acute needs   CCM Care Plan  Allergies  Allergen Reactions  . Vancomycin Rash    Outpatient Encounter Medications as of 10/27/2020  Medication Sig Note  . allopurinol (ZYLOPRIM) 100 MG tablet Take 100 mg by mouth daily.   Marland Kitchen aspirin EC 81 MG tablet Take 81 mg by mouth daily. Swallow whole.   . cefdinir (OMNICEF) 300 MG capsule Take 1 capsule (300 mg total) by mouth 2 (two) times daily.   . clopidogrel (PLAVIX) 75 MG tablet Take 1 tablet (75 mg total) by mouth daily.   . Continuous Blood Gluc Sensor (FREESTYLE LIBRE 14 DAY SENSOR) MISC Use as directed to check blood sugars   . diltiazem (CARDIZEM CD) 300 MG 24 hr capsule Take 300 mg by mouth daily.   . dorzolamide (TRUSOPT) 2 % ophthalmic solution Place 1 drop into both eyes at bedtime.   . ferrous sulfate 325 (65 FE) MG tablet Take  325-650 mg by mouth See admin instructions. 325mg  on Monday Wednesday Friday 650mg  on Sunday Tuesday Thursday saturday 10/21/2020: Taking 2 tablets Monday, Wednesday and Friday and then 1 tablet on Tuesday, Thursday, Saturday and Sunday.   . furosemide (LASIX) 40 MG tablet Take 2 tabs in morning and 1 tab evening   . gabapentin (NEURONTIN) 100 MG capsule Take 2 capsules in am 1 capsule mid day and 1 capsule in evening (Patient taking differently: Take 100-200 mg by mouth See admin instructions. Take 200mg  in am 100mg   mid day and 100mg  in evening)   . glucose blood (FREESTYLE PRECISION NEO TEST) test strip Use as instructed   . latanoprost (XALATAN) 0.005 % ophthalmic solution Place 1 drop into both eyes at bedtime.    Marland Kitchen linezolid (ZYVOX) 600 MG tablet Take 1 tablet (600 mg total) by mouth 2 (two) times daily.   . Multiple Vitamins-Minerals (PRESERVISION AREDS PO) Take 1 tablet by mouth daily. (Patient not taking: Reported on 10/22/2020)   . pantoprazole (PROTONIX) 40 MG tablet Take 1 tablet (40 mg total) by mouth daily.   . rosuvastatin (CRESTOR) 10 MG tablet Take 1 tablet (10 mg total) by mouth daily. (Patient taking differently: Take 10 mg by mouth every 7 (seven) days. Wednesday of each week)   . Semaglutide, [name], (OZEMPIC, [name],) 2 MG/1.5ML SOPN Inject 1 mg into the skin once a week. (Patient taking differently: Inject 1 mg into the skin every Wednesday.)   . senna-docusate (SENOKOT-S) 8.6-50 MG tablet Take  1 tablet by mouth 2 (two) times daily between meals as needed for mild constipation or moderate constipation.   . tamsulosin (FLOMAX) 0.4 MG CAPS capsule Take 0.4 mg by mouth daily.    No facility-administered encounter medications on file as of 10/27/2020.    Patient Active Problem List   Diagnosis Date Noted  . 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[place])   . Left ankle swelling   . Diabetic foot infection ([place])   .  Cerebral thrombosis with cerebral infarction 07/20/2020  . Thrombocytopenia ([place]) 07/19/2020  . Cerebral ischemia 07/18/2020  . Bilateral hearing loss 05/20/2020  . Does use hearing aid 05/20/2020  . Diabetic ulcer of left great toe ([place]) 01/16/2020  . Controlled type 2 diabetes mellitus with stable proliferative retinopathy of both eyes, with long-term current use of insulin ([place]) 01/06/2020  . Intermediate stage nonexudative age-related macular degeneration of both eyes 01/06/2020  . Left epiretinal membrane 01/06/2020  . [place] of right macula 01/06/2020  . AKI (acute kidney injury) ([place]) 11/02/2019  . Cellulitis 11/02/2019  . Elevated TSH 07/10/2018  . Nephropathy 02/28/2018  . Disturbance of skin sensation 02/28/2018  . PAD (peripheral artery disease) ([place]) 07/17/2017  . 110.1 10/21/2013  . Epiphora 04/24/2013  . Laxity of eyelid 04/24/2013  . Anemia 04/09/2013  . History of peripheral vascular disease 04/09/2013  . History of stroke 04/09/2013  . S/P unilateral BKA (below knee amputation) ([place]) 03/17/2013  . Diabetes mellitus type 2 in obese ([place]) 03/17/2013  . Mixed hyperlipidemia 03/17/2013  . Essential hypertension 03/17/2013  . Gout 03/17/2013  . CKD (chronic kidney disease), stage III ([place]) 03/17/2013  . OSA on CPAP 03/17/2013  . Anemia, iron deficiency 03/17/2013  . Diverticulosis of colon 03/17/2013  . RBBB 03/17/2013  . Punctal stenosis, acquired 10/02/2012    Conditions to be addressed/monitored:PAD, Cellulitis of left lower extremity, Neuropathy, Mixed hyperlipidemia, Type 2 diabetes mellitus   Care Plan : Cellulitis of Left Great Toe  Updates made by Lynne Logan, RN since 10/27/2020 12:00 AM    Problem: Status post amputation of great toe, left   Priority: High    Long-Range Goal: Status post amputation of great toe, left   Start Date: 09/17/2020  Expected End Date: 03/19/2021  Recent Progress: On track  Priority: High  Note:   Current Barriers:    Ineffective Self Health Maintenance   Unable to perform ADLs independently  Unable to perform IADLs independently  Status post amputation of great toe, left Clinical Goal(s):  Marland Kitchen Collaboration with [name], [name] regarding development and update of comprehensive plan of care as evidenced by provider attestation and co-signature . Inter-disciplinary care team collaboration (see longitudinal plan of care)  patient will work with care management team to address care coordination and chronic disease management needs related to Disease Management  Educational Needs  Care Coordination  Medication Management and Education  Psychosocial Support   Interventions:  10/27/20 completed successful outbound call with patient   Evaluation of current treatment plan related to  PAD/Status post amputation of great toe, left , self-management and patient's adherence to plan as established by provider.  Collaboration with [name], [name] regarding development and update of comprehensive plan of care as evidenced by provider attestation       and co-signature  Inter-disciplinary care team collaboration (see longitudinal plan of care)  Determined patient was admitted into [facility] from 10/21/20-10/24/20 for dx: Infection of  amputation stump of left lower extremity  Reviewed and discussed the following Assessment/Plan and discharge instructions with patient:          Discharge Instructions     (HEART FAILURE PATIENTS) Call MD:  Anytime you have any of the following symptoms: 1) 3 pound weight gain in 24 hours or 5 pounds in 1 week 2) shortness of breath, with or without a dry hacking cough 3) swelling in the hands, feet or stomach 4) if you have to sleep on extra pillows at night in order to breathe.   Complete by: As directed      Call MD for:  difficulty breathing, headache or visual disturbances   Complete by: As directed      Call MD for:  persistant dizziness or  light-headedness   Complete by: As directed      Call MD for:  redness, tenderness, or signs of infection (pain, swelling, redness, odor or green/yellow discharge around incision site)   Complete by: As directed      Call MD for:  severe uncontrolled pain   Complete by: As directed      Call MD for:  temperature >100.4   Complete by: As directed      Diet - low sodium heart healthy   Complete by: As directed      Diet Carb Modified   Complete by: As directed      Discharge instructions   Complete by: As directed      It has been a pleasure taking care of you!   You were hospitalized left foot swelling, redness and warmth that has improved with antibiotics.  We are discharging you more of these antibiotics to complete treatment course.  We also recommend holding your fluid medication (Lasix) for the next 4 to 5 days to make sure your kidney number improves.  Please follow-up with your primary care doctor and podiatrist in about 1 week.  Please review your new medication list and the directions on your medications before you take them.     Take care,    Increase activity slowly   Complete by: As directed      No wound care   Complete by: As directed     Medication List     STOP taking these medications   amoxicillin-clavulanate 875-125 MG tablet Commonly known as: AU    Confirmed patient continues to receive SNV and PT/OT via [facility] contacted PCP for Rx needs for new prosthesis and PT will assist with coordinating with [place]   Confirmed patient has all medications on hand at home and verbalizes understanding his prescribed regimen  Reviewed medications with patient and discussed importance of medication adherence  Reviewed s/s suggestive of infection and or when to contact the doctor is needed  Reviewed and discussed next scheduled follow appointments: Dr. Sherryle Lis 11/10/20 $RemoveBef'@4'yneUjaZgjg$ :30 PM; CVD post hospital follow up for medication review and low BP  scheduled for 11/12/20 $RemoveBe'@9'tOmanBXlQ$ :15 AM: PCP OV follow up with [name] [name] on 12/30/20 $RemoveBe'@8'MnHrxWaoj$ :30 AM  . Discussed plans with patient for ongoing care management follow up and provided patient with direct contact information for care management team Self Care Activities:  . Continue to keep all scheduled follow up appointments . Take medications as directed  . Let your healthcare team know if you are unable to take your medications . Call your pharmacy for refills at least 7 days prior to running out of medication . Call your surgeon  if you develop s/s suggestive of infection or experiencing drainage, bleeding or increased pain to your surgical wound  Patient Goals: - to recover from my toe amputation  Follow Up Plan: Telephone follow up appointment with care management team member scheduled for: 01/04/21    Plan:Telephone follow up appointment with care management team member scheduled for:  01/04/21  Barb Merino, RN, BSN, CCM Care Management Coordinator [facility] [facility]  Direct Phone: [phone]

## 2020-10-28 NOTE — Patient Instructions (Signed)
Goals Addressed    . Wound health s/p amputation to left great toe   On track    Timeframe:  Long-Range Goal Priority:  High Start Date:  09/17/20                           Expected End Date: 03/19/21  Next Scheduled Follow Up Date: 01/04/21   Self Care Activities:  . Continue to keep all scheduled follow up appointments . Take medications as directed  . Let your healthcare team know if you are unable to take your medications . Call your pharmacy for refills at least 7 days prior to running out of medication  . Call your surgeon if you develop s/s suggestive of infection or experiencing drainage, bleeding or increased pain to your surgical wound  Patient Goals: - to recover from my toe amputation

## 2020-10-28 NOTE — Telephone Encounter (Signed)
Transition Care Management Follow-up Telephone Call  Date of discharge and from where: 10/24/20 [facility]  How have you been since you were released from the hospital? hes doing fine  Any questions or concerns? no  Items Reviewed:  Did the pt receive and understand the discharge instructions provided?yes  Medications obtained and verified? No pt will bring meds to appt  Any new allergies since your discharge? yes  Dietary orders reviewed? no  Do you have support at home? yes Other (ie: DME, Home Health, etc) yes Functional Questionnaire: (I = Independent and D = Dependent)  Bathing/Dressing- I   Meal Prep- I  Eating- I  Maintaining continence- I  Transferring/Ambulation- I  Managing Meds- D daughter helps with meds   Follow up appointments reviewed:    PCP Hospital f/u appt confirmed? [name] DNP,[name]-BC 10/29/20 at 10:45am   Specialist Hospital f/u appt  confirmed? no  Are transportation arrangements needed?no  If their condition worsens, is the pt aware to call  their PCP or go to the ED? yes  Was the patient provided with contact information for the PCP's office or ED? yes  Was the pt encouraged to call back with questions or concerns? yes

## 2020-10-28 NOTE — Patient Instructions (Addendum)
Visit Information It was great speaking with you today!  Please let me know if you have any questions about our visit.   Patient Care Plan: CCM Pharmacy Care Plan    Problem Identified: HTN, HLD, DM II, GOUT, NEUROPATHY   Priority: High    Long-Range Goal: Disease Management   Recent Progress: On track  Priority: High  Note:     Current Barriers:  . Unable to independently monitor therapeutic efficacy . Unable to self administer medications as prescribed . Does not adhere to prescribed medication regimen   Pharmacist Clinical Goal(s):  Marland Kitchen Patient will achieve adherence to monitoring guidelines and medication adherence to achieve therapeutic efficacy . contact provider office for questions/concerns as evidenced notation of same in electronic health record through collaboration with PharmD and provider.    Interventions: . 1:1 collaboration with [name], [name] regarding development and update of comprehensive plan of care as evidenced by provider attestation and co-signature . Inter-disciplinary care team collaboration (see longitudinal plan of care) . Comprehensive medication review performed; medication list updated in electronic medical record  Hypertension (BP goal <130/80) -Controlled -Current treatment: . Diltiazem 300 mg capsule take once per day  -Current home readings: patient is not currently checking his BP at home  -Current dietary habits: will discuss in greater detail at next office visit  -Current exercise habits: patient is not currently exercising  -Denies hypotensive/hypertensive symptoms -Educated on BP goals and benefits of medications for prevention of heart attack, stroke and kidney damage; Daily salt intake goal < 2300 mg; Exercise goal of 150 minutes per week; Proper BP monitoring technique; -Counseled to monitor BP at home once a day, document, and provide log at future appointments -Using large BP cuff that daughter purchased  -Counseled on diet  and exercise extensively Recommended to continue current medication -Will have CPA onboard patient to [facility] to help with medication adherence and accessibility.   Hyperlipidemia: (LDL goal < 70) -Controlled -Current treatment: . Rosuvastatin 10 mg tablet daily  -Current dietary patterns: patient reports that he is not eating as healthy as he should. Eating some fried foods.  -Current exercise habits: at this time the patient is not doing any exercises.  -Educated on Cholesterol goals;  Benefits of statin for ASCVD risk reduction; Importance of limiting foods high in cholesterol; -Recommended to continue current medication     Diabetes (A1c goal <7%) -Controlled -Current medications: . Ozempic 1 mg  - inject once a week on Wednesday  -Current home glucose readings  Patient has a CGM and his BS this morning is 114    At this time he does not allow his daughter access to the  numbers.  -Denies hypoglycemic/hyperglycemic symptoms -Current meal patterns: Will discuss at next visit  . drinks: Glucerna three times per day  -Current exercise: none noted  -Educated on A1c and blood sugar goals; Complications of diabetes including kidney damage, retinal damage, and cardiovascular disease; Prevention and management of hypoglycemic episodes; Benefits of routine self-monitoring of blood sugar; Continuous glucose monitoring; -Patient recently discharged from the hospital, patient is interested in trying  -Counseled to check feet daily and get yearly eye exams -Recommended to continue current medication Counseled on the importance of CGM.   GOUT (Goal: prevent flare ups) -Controlled -Current treatment  . Allopurinol 100 mg tablet twice per day  . Colcrys 0.6 mg - as needed for gout flare up  -Counseled on diet and exercise extensively Recommended that patient continue to be careful with his diet.  Neuropathy (Goal: to prevent nerve pain) -Controlled -Current treatment   . Gabapentin 100 mg capsule: Two in the morning, 1 at midday and 1 at night.  -Recommended to continue current medication  Query BPH -Controlled -Current treatment  . Tamsulosin 0.4 mg capsule once per day  -Recommended to continue current medication Will collaborate with PCP and other providers to dertemine what medications patient should be currently taking for BPH.    GERD (Goal: reduce signs and symptoms) -Controlled -Current treatment  . Pantoprazole 40 mg tablet daily  -Patients daughter reports her father has not complained about GERD symptoms for a long time. -Collaborate with PCP team to determine if medication is necessary at this time, patient might be a candidate to taper medication down.   Health Maintenance -Vaccine Therapy:  will discuss with patient getting zoster vaccine during next office visit.  -Current therapy:  . Ferrous Sulfate 325 (65 FE) - take 1 tablet twice per day on Monday, Wednesday and Friday and take one tablet all other days.  . Preservision  . Refresh Ophthalmic  . Colace 100 mg taking one capsule daily  -Educated on Herbal supplement research is limited and benefits usually cannot be proven Cost vs benefit of each product must be carefully weighed by individual consumer Supplements may interfere with prescription drugs -Patient is satisfied with current therapy and denies issues -Recommended to continue current medication   Patient Goals/Self-Care Activities . Patient will:  - take medications as prescribed focus on medication adherence by using a pill packaging system  check glucose using CGM system, document, and provide at future appointments  Patient Goals/Self-Care Activities . Patient will:  - take medications as prescribed  Follow Up Plan: The patient has been provided with contact information for the care management team and has been advised to call with any health related questions or concerns.       Patient agreed to  services and verbal consent obtained.   The patient verbalized understanding of instructions, educational materials, and care plan provided today and agreed to receive a mailed copy of patient instructions, educational materials, and care plan.   [name], PharmD Clinical Pharmacist [facility] [phone]

## 2020-10-29 ENCOUNTER — Ambulatory Visit (INDEPENDENT_AMBULATORY_CARE_PROVIDER_SITE_OTHER): Payer: Medicare Other | Admitting: Nurse Practitioner

## 2020-10-29 ENCOUNTER — Other Ambulatory Visit: Payer: Self-pay

## 2020-10-29 ENCOUNTER — Encounter: Payer: Self-pay | Admitting: Nurse Practitioner

## 2020-10-29 VITALS — BP 110/62 | HR 59 | Temp 97.3°F

## 2020-10-29 DIAGNOSIS — Z79899 Other long term (current) drug therapy: Secondary | ICD-10-CM | POA: Diagnosis not present

## 2020-10-29 DIAGNOSIS — Z4801 Encounter for change or removal of surgical wound dressing: Secondary | ICD-10-CM | POA: Diagnosis not present

## 2020-10-29 DIAGNOSIS — N179 Acute kidney failure, unspecified: Secondary | ICD-10-CM | POA: Diagnosis not present

## 2020-10-29 DIAGNOSIS — L03116 Cellulitis of left lower limb: Secondary | ICD-10-CM | POA: Diagnosis not present

## 2020-10-29 DIAGNOSIS — A419 Sepsis, unspecified organism: Secondary | ICD-10-CM | POA: Diagnosis not present

## 2020-10-29 DIAGNOSIS — Z4781 Encounter for orthopedic aftercare following surgical amputation: Secondary | ICD-10-CM | POA: Diagnosis not present

## 2020-10-29 DIAGNOSIS — I69398 Other sequelae of cerebral infarction: Secondary | ICD-10-CM | POA: Diagnosis not present

## 2020-10-29 DIAGNOSIS — E1122 Type 2 diabetes mellitus with diabetic chronic kidney disease: Secondary | ICD-10-CM | POA: Diagnosis not present

## 2020-10-29 DIAGNOSIS — I5042 Chronic combined systolic (congestive) and diastolic (congestive) heart failure: Secondary | ICD-10-CM | POA: Diagnosis not present

## 2020-10-29 DIAGNOSIS — E1142 Type 2 diabetes mellitus with diabetic polyneuropathy: Secondary | ICD-10-CM | POA: Diagnosis not present

## 2020-10-29 DIAGNOSIS — I10 Essential (primary) hypertension: Secondary | ICD-10-CM | POA: Diagnosis not present

## 2020-10-29 DIAGNOSIS — I959 Hypotension, unspecified: Secondary | ICD-10-CM | POA: Diagnosis not present

## 2020-10-29 DIAGNOSIS — Z89412 Acquired absence of left great toe: Secondary | ICD-10-CM | POA: Diagnosis not present

## 2020-10-29 DIAGNOSIS — N184 Chronic kidney disease, stage 4 (severe): Secondary | ICD-10-CM | POA: Diagnosis not present

## 2020-10-29 DIAGNOSIS — E1151 Type 2 diabetes mellitus with diabetic peripheral angiopathy without gangrene: Secondary | ICD-10-CM | POA: Diagnosis not present

## 2020-10-29 DIAGNOSIS — Z89611 Acquired absence of right leg above knee: Secondary | ICD-10-CM | POA: Diagnosis not present

## 2020-10-29 DIAGNOSIS — R6521 Severe sepsis with septic shock: Secondary | ICD-10-CM | POA: Diagnosis not present

## 2020-10-29 DIAGNOSIS — D631 Anemia in chronic kidney disease: Secondary | ICD-10-CM | POA: Diagnosis not present

## 2020-10-29 DIAGNOSIS — I110 Hypertensive heart disease with heart failure: Secondary | ICD-10-CM | POA: Diagnosis not present

## 2020-10-29 DIAGNOSIS — Z6836 Body mass index (BMI) 36.0-36.9, adult: Secondary | ICD-10-CM | POA: Diagnosis not present

## 2020-10-29 DIAGNOSIS — I872 Venous insufficiency (chronic) (peripheral): Secondary | ICD-10-CM | POA: Diagnosis not present

## 2020-10-29 DIAGNOSIS — Z9181 History of falling: Secondary | ICD-10-CM | POA: Diagnosis not present

## 2020-10-29 DIAGNOSIS — T8744 Infection of amputation stump, left lower extremity: Secondary | ICD-10-CM

## 2020-10-29 DIAGNOSIS — E782 Mixed hyperlipidemia: Secondary | ICD-10-CM

## 2020-10-29 DIAGNOSIS — Z794 Long term (current) use of insulin: Secondary | ICD-10-CM | POA: Diagnosis not present

## 2020-10-29 DIAGNOSIS — E1169 Type 2 diabetes mellitus with other specified complication: Secondary | ICD-10-CM | POA: Diagnosis not present

## 2020-10-29 DIAGNOSIS — E1159 Type 2 diabetes mellitus with other circulatory complications: Secondary | ICD-10-CM | POA: Diagnosis not present

## 2020-10-29 DIAGNOSIS — R531 Weakness: Secondary | ICD-10-CM | POA: Diagnosis not present

## 2020-10-29 DIAGNOSIS — E113553 Type 2 diabetes mellitus with stable proliferative diabetic retinopathy, bilateral: Secondary | ICD-10-CM | POA: Diagnosis not present

## 2020-10-29 DIAGNOSIS — E669 Obesity, unspecified: Secondary | ICD-10-CM | POA: Diagnosis not present

## 2020-10-29 DIAGNOSIS — M869 Osteomyelitis, unspecified: Secondary | ICD-10-CM | POA: Diagnosis not present

## 2020-10-29 NOTE — Progress Notes (Addendum)
I,Tianna Badgett,acting as a Education administrator for [employer], [name].,have documented all relevant documentation on the behalf of [name], [name],as directed by  [name], [name] while in the presence of [name], [place].  This visit occurred during the SARS-CoV-2 public health emergency.  Safety protocols were in place, including screening questions prior to the visit, additional usage of staff PPE, and extensive cleaning of exam room while observing appropriate contact time as indicated for disinfecting solutions.  Subjective:     Patient ID: Micheal Walls , male    DOB: 22-Mar-1936 , 85 y.o.   MRN: [id number]   Chief Complaint  Patient presents with   Hospitalization Follow-up    HPI  Patient is here for hospital follow-up. His left first and second toe amputation in April 2022 presenting with left foot swelling, redness and increased warmth to touch and admitted for sepsis secondary to left foot cellulitis.  He had leukocytosis to 17.8.  He was tachycardic and tachypneic.  He also had AKI on CKD. He was diagnosed with Sepsis. He was treated with IV antibiotics Augmentin and Doxycycline. Treated with IV Zyvox and IV ceftriaxone.  He was evaluated by podiatry who recommended antibiotics and outpatient follow-up with his surgeon.  He was discharged on p.o. Zyvox and p.o. cefdinir for 7 more days.  He will continue with Home health PT/OT ordered.  Patient uses a walker at home.  He held his lasix for 4 days and is now back on it and tolerating well.   He is having his wound cared for by Dr. Sherryle Lis (podiatry). He has nurse to come MWF. He has a dressing on his ankle with an ace bandage to hold on. He reports he is doing okay at home. He is able to bath himself. He has not been cleared to take a shower.   Follow up with [facility] for his left leg prosthesis. I have written a new order for re-fitting due to recent weight loss.   He reports he is doing well.     Past Medical History:   Diagnosis Date   Acute metabolic encephalopathy 09/17/9627   Amputated below knee [facility])    right   Anemia    Aspiration pneumonia ([place]) 11/08/4130   Diastolic heart failure (HCC)    Diverticulosis    DM (diabetes mellitus) ([place])    Gout    Hyperlipemia    OSA on CPAP    RBBB    Sepsis ([place]) 11/02/2019   Systemic hypertension      Family History  Problem Relation Age of Onset   Diabetes Mother    Heart attack Father    Cancer Sister      Current Outpatient Medications:    allopurinol (ZYLOPRIM) 100 MG tablet, Take 100 mg by mouth daily., Disp: , Rfl:    aspirin EC 81 MG tablet, Take 81 mg by mouth daily. Swallow whole., Disp: , Rfl:    cefdinir (OMNICEF) 300 MG capsule, Take 1 capsule (300 mg total) by mouth 2 (two) times daily., Disp: 14 capsule, Rfl: 0   clopidogrel (PLAVIX) 75 MG tablet, Take 1 tablet (75 mg total) by mouth daily., Disp: 30 tablet, Rfl: 0   Continuous Blood Gluc Sensor (FREESTYLE LIBRE 14 DAY SENSOR) MISC, Use as directed to check blood sugars, Disp: 6 each, Rfl: 1   diltiazem (CARDIZEM CD) 300 MG 24 hr capsule, Take 300 mg by mouth daily., Disp: , Rfl:    dorzolamide (TRUSOPT) 2 % ophthalmic solution, Place  1 drop into both eyes at bedtime., Disp: , Rfl:    ferrous sulfate 325 (65 FE) MG tablet, Take 325-650 mg by mouth See admin instructions. 34m on Monday Wednesday Friday 6524mon Sunday Tuesday Thursday saturday, Disp: , Rfl:    furosemide (LASIX) 40 MG tablet, Take 2 tabs in morning and 1 tab evening, Disp: 180 tablet, Rfl: 0   gabapentin (NEURONTIN) 100 MG capsule, Take 2 capsules in am 1 capsule mid day and 1 capsule in evening, Disp: 270 capsule, Rfl: 0   glucose blood (FREESTYLE PRECISION NEO TEST) test strip, Use as instructed, Disp: 100 each, Rfl: 12   latanoprost (XALATAN) 0.005 % ophthalmic solution, Place 1 drop into both eyes at bedtime. , Disp: , Rfl:    linezolid (ZYVOX) 600 MG tablet, Take 1 tablet (600 mg total) by mouth 2 (two) times  daily., Disp: 14 tablet, Rfl: 0   pantoprazole (PROTONIX) 40 MG tablet, Take 1 tablet (40 mg total) by mouth daily., Disp: 30 tablet, Rfl: 0   rosuvastatin (CRESTOR) 10 MG tablet, Take 1 tablet (10 mg total) by mouth daily., Disp: 90 tablet, Rfl: 1   Semaglutide, [name], (OZEMPIC, [name],) 2 MG/1.5ML SOPN, Inject 1 mg into the skin once a week., Disp: 9 mL, Rfl: 1   senna-docusate (SENOKOT-S) 8.6-50 MG tablet, Take 1 tablet by mouth 2 (two) times daily between meals as needed for mild constipation or moderate constipation., Disp: 60 tablet, Rfl: 0   tamsulosin (FLOMAX) 0.4 MG CAPS capsule, Take 0.4 mg by mouth daily., Disp: , Rfl:    Allergies  Allergen Reactions   Vancomycin Rash     Review of Systems  Constitutional: Negative.   Respiratory: Negative.  Negative for cough, shortness of breath and wheezing.   Cardiovascular: Negative.  Negative for chest pain, palpitations and leg swelling.  Gastrointestinal: Negative.   Skin:       Left foot with dressing, has changed 2 times a week by Home health nurse and goes to podiatrist for evaluation  Neurological: Negative.   Psychiatric/Behavioral: Negative.      Today's Vitals   10/29/20 1109  BP: 110/62  Pulse: (!) 59  Temp: (!) 97.3 F (36.3 C)  TempSrc: Oral   There is no height or weight on file to calculate BMI.   Objective:  Physical Exam Vitals reviewed.  Constitutional:      General: He is not in acute distress. Cardiovascular:     Rate and Rhythm: Normal rate and regular rhythm.     Pulses: Normal pulses.     Heart sounds: Normal heart sounds. No murmur heard.   Pulmonary:     Effort: Pulmonary effort is normal. No respiratory distress.     Breath sounds: Normal breath sounds. No wheezing.  Musculoskeletal:     Right lower leg: No edema.     Left lower leg: Edema (1+) present.  Skin:    General: Skin is warm and dry.     Comments: Left lower extremity with dressing intact  Neurological:     General: No  focal deficit present.     Mental Status: He is alert and oriented to person, place, and time.     Cranial Nerves: No cranial nerve deficit.  Psychiatric:        Mood and Affect: Mood normal.        Behavior: Behavior normal.        Thought Content: Thought content normal.  Judgment: Judgment normal.         Assessment And Plan:     1. Infection of amputation stump of left lower extremity ([place]) TCM Performed. A member of the clinical team spoke with the patient upon dischare. Discharge summary was reviewed in full detail during the visit. Meds reconciled and compared to discharge meds. Medication list is updated and reviewed with the patient.  Greater than 50% face to face time was spent in counseling an coordination of care.  All questions were answered to the satisfaction of the patient.   He was treated with IV antibiotics and oral antibiotics.  He feels he is doing well - CBC with Differential/Platelet  2. Mixed hyperlipidemia Chronic, controlled Continue with current medications, tolerating medications well - CMP14+EGFR - Lipid panel  3. Type 2 diabetes mellitus with other circulatory complication, with long-term current use of insulin (HCC) Chronic, has been stable Continue with current medications - Hemoglobin A1c - CMP14+EGFR  4. Essential hypertension B/P is well controlled.  CMP ordered to check renal function.  The importance of regular exercise and dietary modification was stressed to the patient.  Stressed importance of losing ten percent of her body weight to help with B/P control.   5. Right above-knee amputee [facility]) He is in need of a re-evaluation of his right AKA due to having recent loss.  I have sent a Rx to the [place]:  He is a right trans-tibial amputee The patient is a diabetic and requires a socket replacement and prosthetic supplies to improve his stability, mobility, and the ability to ambulate with a prosthesis to maintain a  healthy lifestyle.  He has also left hallux amputated and now has more issues with balance and stability.  He is currently using a walker and a wheelchair for mobility aides. He is a functional K2 level ambulator and recently lost 40 lost 40 pounds.  His socket is ill fitting and note safe.  He is able to do single leg sit to stan, and exhibits the strength, motivation, potential and ability to ambulate with a prosthesis safely and independently.     Patient was given opportunity to ask questions. Patient verbalized understanding of the plan and was able to repeat key elements of the plan. All questions were answered to their satisfaction.  [name], [name]   I, [name], [name], have reviewed all documentation for this visit. The documentation on 11/03/20 for the exam, diagnosis, procedures, and orders are all accurate and complete.   IF YOU HAVE BEEN REFERRED TO A SPECIALIST, IT MAY TAKE 1-2 WEEKS TO SCHEDULE/PROCESS THE REFERRAL. IF YOU HAVE NOT HEARD FROM US/SPECIALIST IN TWO WEEKS, PLEASE GIVE [place] A CALL AT [phone].   THE PATIENT IS ENCOURAGED TO PRACTICE SOCIAL DISTANCING DUE TO THE COVID-19 PANDEMIC.

## 2020-10-29 NOTE — Patient Instructions (Signed)
Toe Amputation Toe amputation is a surgical procedure to remove all or part of a toe. A person may have this procedure if:  Tissue in the toe is dying because of poor blood supply.  There is a severe infection in the toe.  There is a cancerous tumor in the toe.  There is a traumatic injury in the toe. Removing the toe keeps nearby tissue healthy. If the toe is infected, removing it helps to keep the infection from spreading. Tell a health care provider about:  Any allergies you have.  All medicines you are taking, including vitamins, herbs, eye drops, creams, and over-the-counter medicines.  Any problems you or family members have had with anesthetic medicines.  Any blood disorders you have.  Any surgeries you have had.  Any medical conditions you have or have had.  Whether you are pregnant or may be pregnant. What are the risks? Generally, this is a safe procedure. However, problems may occur, including:  Bleeding.  Buildup of blood and fluid (hematoma).  Infection.  Allergic reactions to medicines.  Tissue death in the flap of skin (flap necrosis).  Trouble with healing.  Minor changes in the way that you walk (your gait).  Feeling pain in the area that was removed (phantom pain). This is rare. What happens before the procedure? Staying hydrated Follow instructions from your health care provider about hydration, which may include:  Up to 2 hours before the procedure - you may continue to drink clear liquids, such as water, clear fruit juice, black coffee, and plain tea.   Eating and drinking restrictions Follow instructions from your health care provider about eating and drinking, which may include:  8 hours before the procedure - stop eating heavy meals or foods, such as meat, fried foods, or fatty foods.  6 hours before the procedure - stop eating light meals or foods, such as toast or cereal.  6 hours before the procedure - stop drinking milk or drinks  that contain milk.  2 hours before the procedure - stop drinking clear liquids. Medicines Ask your health care provider about:  Changing or stopping your regular medicines. This is especially important if you are taking diabetes medicines or blood thinners.  Taking medicines such as aspirin and ibuprofen. These medicines can thin your blood. Do not take these medicines unless your health care provider tells you to take them.  Taking over-the-counter medicines, vitamins, herbs, and supplements. General instructions  Ask your health care provider: ? How your surgery site will be marked. ? What steps will be taken to help prevent infection. These may include:  Washing skin with a germ-killing soap.  Taking antibiotic medicine.  Plan to have someone take you home from the hospital or clinic.  If you will be going home right after the procedure, plan to have someone with you for 24 hours. What happens during the procedure?  An IV will be inserted into one of your veins.  You will be given one or more of the following: ? A medicine to help you relax (sedative). ? A medicine to numb the area (local anesthetic). ? A medicine to make you fall asleep (general anesthetic). ? A medicine that is injected into your spine to numb the area below and slightly above the injection site (spinal anesthetic). ? A medicine that is injected into an area of your body to numb everything below the injection site (regional anesthetic).  Your surgeon will mark the area of your toe for removal.  Your surgeon will make an incision in your toe.  The dead tissue and bone will be removed.  Nerves and blood vessels will be tied or heated with a tool to stop bleeding.  The area will be drained and cleaned.  If only part of a toe is removed, the remaining part will be covered with a flap of skin.  The incision will be treated in one of these ways: ? It will be closed with stitches (sutures). ? It will be  left open to heal if there is an infection.  The incision area may be packed with gauze and covered with bandages (dressings).  Tissue samples may be sent to a lab to be examined under a microscope. The procedure may vary among health care providers and hospitals. What happens after the procedure?  Your blood pressure, heart rate, breathing rate, and blood oxygen level will be monitored until you leave the hospital or clinic.  Your foot will be raised up (elevated) to relieve swelling.  You will be monitored for pain.  You will be given pain medicines and antibiotics.  Your health care provider or physical therapist will help you move around as soon as possible.  Do not drive for 24 hours if you were given a sedative during your procedure. Summary  Toe amputation is a surgical procedure to remove all or part of a toe.  Before the procedure, follow instructions from your health care provider about taking medicines and about eating or drinking restrictions.  During the procedure, steps will be taken to reduce your risk of infection.  After the procedure, you will be given pain medicines and antibiotics.  After the procedure, your foot will be raised up (elevated) to relieve swelling. This information is not intended to replace advice given to you by your health care provider. Make sure you discuss any questions you have with your health care provider. Document Revised: 05/23/2018 Document Reviewed: 05/23/2018 Elsevier Patient Education  2021 [employer].

## 2020-10-30 ENCOUNTER — Encounter: Payer: Self-pay | Admitting: Podiatry

## 2020-10-30 ENCOUNTER — Ambulatory Visit (INDEPENDENT_AMBULATORY_CARE_PROVIDER_SITE_OTHER): Payer: Medicare Other | Admitting: Podiatry

## 2020-10-30 DIAGNOSIS — B351 Tinea unguium: Secondary | ICD-10-CM | POA: Diagnosis not present

## 2020-10-30 DIAGNOSIS — E1151 Type 2 diabetes mellitus with diabetic peripheral angiopathy without gangrene: Secondary | ICD-10-CM | POA: Diagnosis not present

## 2020-10-30 DIAGNOSIS — N179 Acute kidney failure, unspecified: Secondary | ICD-10-CM | POA: Diagnosis not present

## 2020-10-30 DIAGNOSIS — E1142 Type 2 diabetes mellitus with diabetic polyneuropathy: Secondary | ICD-10-CM | POA: Diagnosis not present

## 2020-10-30 DIAGNOSIS — Z89412 Acquired absence of left great toe: Secondary | ICD-10-CM

## 2020-10-30 DIAGNOSIS — Z4781 Encounter for orthopedic aftercare following surgical amputation: Secondary | ICD-10-CM | POA: Diagnosis not present

## 2020-10-30 DIAGNOSIS — L03116 Cellulitis of left lower limb: Secondary | ICD-10-CM | POA: Diagnosis not present

## 2020-10-30 DIAGNOSIS — A419 Sepsis, unspecified organism: Secondary | ICD-10-CM | POA: Diagnosis not present

## 2020-10-30 DIAGNOSIS — Z4801 Encounter for change or removal of surgical wound dressing: Secondary | ICD-10-CM | POA: Diagnosis not present

## 2020-10-30 DIAGNOSIS — Z89511 Acquired absence of right leg below knee: Secondary | ICD-10-CM | POA: Diagnosis not present

## 2020-10-30 DIAGNOSIS — R6521 Severe sepsis with septic shock: Secondary | ICD-10-CM | POA: Diagnosis not present

## 2020-10-30 LAB — CBC WITH DIFFERENTIAL/PLATELET
Basophils Absolute: 0 10*3/uL (ref 0.0–0.2)
Basos: 1 %
EOS (ABSOLUTE): 0.2 10*3/uL (ref 0.0–0.4)
Eos: 4 %
Hematocrit: 29.6 % — ABNORMAL LOW (ref 37.5–51.0)
Hemoglobin: 9.6 g/dL — ABNORMAL LOW (ref 13.0–17.7)
Immature Grans (Abs): 0.1 10*3/uL (ref 0.0–0.1)
Immature Granulocytes: 1 %
Lymphocytes Absolute: 1.5 10*3/uL (ref 0.7–3.1)
Lymphs: 23 %
MCH: 28.6 pg (ref 26.6–33.0)
MCHC: 32.4 g/dL (ref 31.5–35.7)
MCV: 88 fL (ref 79–97)
Monocytes Absolute: 0.5 10*3/uL (ref 0.1–0.9)
Monocytes: 8 %
Neutrophils Absolute: 4.1 10*3/uL (ref 1.4–7.0)
Neutrophils: 63 %
Platelets: 240 10*3/uL (ref 150–450)
RBC: 3.36 x10E6/uL — ABNORMAL LOW (ref 4.14–5.80)
RDW: 15 % (ref 11.6–15.4)
WBC: 6.4 10*3/uL (ref 3.4–10.8)

## 2020-10-30 LAB — HEMOGLOBIN A1C
Est. average glucose Bld gHb Est-mCnc: 134 mg/dL
Hgb A1c MFr Bld: 6.3 % — ABNORMAL HIGH (ref 4.8–5.6)

## 2020-10-30 LAB — CMP14+EGFR
ALT: 29 IU/L (ref 0–44)
AST: 22 IU/L (ref 0–40)
Albumin/Globulin Ratio: 0.8 — ABNORMAL LOW (ref 1.2–2.2)
Albumin: 3.3 g/dL — ABNORMAL LOW (ref 3.6–4.6)
Alkaline Phosphatase: 82 IU/L (ref 44–121)
BUN/Creatinine Ratio: 25 — ABNORMAL HIGH (ref 10–24)
BUN: 42 mg/dL — ABNORMAL HIGH (ref 8–27)
Bilirubin Total: 0.3 mg/dL (ref 0.0–1.2)
CO2: 21 mmol/L (ref 20–29)
Calcium: 10.3 mg/dL — ABNORMAL HIGH (ref 8.6–10.2)
Chloride: 109 mmol/L — ABNORMAL HIGH (ref 96–106)
Creatinine, Ser: 1.71 mg/dL — ABNORMAL HIGH (ref 0.76–1.27)
Globulin, Total: 3.9 g/dL (ref 1.5–4.5)
Glucose: 107 mg/dL — ABNORMAL HIGH (ref 65–99)
Potassium: 5.3 mmol/L — ABNORMAL HIGH (ref 3.5–5.2)
Sodium: 142 mmol/L (ref 134–144)
Total Protein: 7.2 g/dL (ref 6.0–8.5)
eGFR: 39 mL/min/{1.73_m2} — ABNORMAL LOW (ref >59)

## 2020-10-30 LAB — LIPID PANEL
Chol/HDL Ratio: 5.3 ratio — ABNORMAL HIGH (ref 0.0–5.0)
Cholesterol, Total: 154 mg/dL (ref 100–199)
HDL: 29 mg/dL — ABNORMAL LOW (ref >39)
LDL Chol Calc (NIH): 102 mg/dL — ABNORMAL HIGH (ref 0–99)
Triglycerides: 129 mg/dL (ref 0–149)
VLDL Cholesterol Cal: 23 mg/dL (ref 5–40)

## 2020-10-30 NOTE — Progress Notes (Signed)
  Subjective:  Patient ID: Micheal Walls, male    DOB: [birth year],  MRN: [id number]  85 y.o. male presents with at risk foot care. Patient has h/o amputation of below knee amputation right lower extremity and digital amputation L hallux.  He was recently hospitalized for infection of postoperative wound of the left foot. He is being followed by Dr. Sherryle Lis and will see him on Nov 10, 2020. He has Home Health following him for dressing changes. States they come on MWF.  PCP: [name], [name] and last visit was: 10/29/2020.  Review of Systems: Negative except as noted in the HPI.   Allergies  Allergen Reactions  . Vancomycin Rash    Objective:  There were no vitals filed for this visit. Constitutional Patient is a pleasant 85 y.o. African American male morbidly obese in NAD. AAO x 3.  Vascular Capillary refill time to digits <4 seconds left lower extremity. Nonpalpable DP pulse(s) left lower extremity. Nonpalpable PT pulse(s) left lower extremity. Pedal hair absent. Lower extremity skin temperature gradient WNL LLE. +3 edema LLE. No cyanosis or clubbing noted.  Neurologic Normal speech. Protective sensation diminished with 10 gram monofilament left lower extremity.  Dermatologic Wound left foot clean with no odor, no drainage, no surrounding erythema. Wound care product in place.  Orthopedic: Lower extremity amputation(s): below knee amputation right lower extremity and digital amputation L hallux. Utilizes motorized chair for mobility assistance.   Hemoglobin A1C Latest Ref Rng & Units 10/29/2020 10/21/2020 07/19/2020 05/20/2020 01/23/2020  HGBA1C 4.8 - 5.6 % 6.3(H) 5.9(H) 6.1(H) 6.2(H) 6.8(H)  Some recent data might be hidden   Assessment:   1. Onychomycosis   2. Type II diabetes mellitus with peripheral circulatory disorder (HCC)   3. Status post amputation of great toe, left ([place])   4. Status post below knee amputation, right ([place])   5. Diabetic peripheral neuropathy associated  with type 2 diabetes mellitus ([place])    Plan:  Patient was evaluated and treated and all questions answered.  Onychomycosis with pain -Nails palliatively debridement as below. -Educated on self-care  Procedure: Nail Debridement Rationale: Pain Type of Debridement: manual, sharp debridement. Instrumentation: Nail nipper only Number of Nails: 4  -Examined patient. -Before toenails trimmed, his wound was protected with a sterile dressing. -Toenails L 2nd toe, L 3rd toe, L 4th toe and L 5th toe debrided in length and girth without iatrogenic bleeding with sterile nail nipper without incident. He states he would like me to continue on trimming his toenails. -Sterile dressing applied to his left foot with new ace bandage. -Patient to report any pedal injuries to medical professional immediately. -He will see Dr. Sherryle Lis on 5/31 for management of postoperative wound. Home Health Nurse is changing dressings three times weekly. -Patient/POA to call should there be question/concern in the interim.  Return in about 3 months (around 01/30/2021).  [name], DPM

## 2020-11-02 ENCOUNTER — Other Ambulatory Visit: Payer: Self-pay

## 2020-11-02 DIAGNOSIS — Z4781 Encounter for orthopedic aftercare following surgical amputation: Secondary | ICD-10-CM | POA: Diagnosis not present

## 2020-11-02 DIAGNOSIS — G629 Polyneuropathy, unspecified: Secondary | ICD-10-CM

## 2020-11-02 DIAGNOSIS — N179 Acute kidney failure, unspecified: Secondary | ICD-10-CM | POA: Diagnosis not present

## 2020-11-02 DIAGNOSIS — R6521 Severe sepsis with septic shock: Secondary | ICD-10-CM | POA: Diagnosis not present

## 2020-11-02 DIAGNOSIS — E1159 Type 2 diabetes mellitus with other circulatory complications: Secondary | ICD-10-CM

## 2020-11-02 DIAGNOSIS — Z4801 Encounter for change or removal of surgical wound dressing: Secondary | ICD-10-CM | POA: Diagnosis not present

## 2020-11-02 DIAGNOSIS — L03116 Cellulitis of left lower limb: Secondary | ICD-10-CM | POA: Diagnosis not present

## 2020-11-02 DIAGNOSIS — A419 Sepsis, unspecified organism: Secondary | ICD-10-CM | POA: Diagnosis not present

## 2020-11-02 DIAGNOSIS — I739 Peripheral vascular disease, unspecified: Secondary | ICD-10-CM

## 2020-11-02 DIAGNOSIS — Z794 Long term (current) use of insulin: Secondary | ICD-10-CM

## 2020-11-02 MED ORDER — OZEMPIC (1 MG/DOSE) 2 MG/1.5ML ~~LOC~~ SOPN: 1.0000 mg | PEN_INJECTOR | SUBCUTANEOUS | 1 refills | Status: DC

## 2020-11-02 MED ORDER — FREESTYLE LIBRE 14 DAY SENSOR MISC: 1 refills | Status: DC

## 2020-11-02 MED ORDER — ROSUVASTATIN CALCIUM 10 MG PO TABS: 10.0000 mg | ORAL_TABLET | Freq: Every day | ORAL | 1 refills | Status: DC

## 2020-11-02 MED ORDER — GABAPENTIN 100 MG PO CAPS: ORAL_CAPSULE | ORAL | 0 refills | Status: DC

## 2020-11-04 ENCOUNTER — Telehealth: Payer: Self-pay | Admitting: Podiatry

## 2020-11-04 DIAGNOSIS — N179 Acute kidney failure, unspecified: Secondary | ICD-10-CM | POA: Diagnosis not present

## 2020-11-04 DIAGNOSIS — A419 Sepsis, unspecified organism: Secondary | ICD-10-CM | POA: Diagnosis not present

## 2020-11-04 DIAGNOSIS — Z4801 Encounter for change or removal of surgical wound dressing: Secondary | ICD-10-CM | POA: Diagnosis not present

## 2020-11-04 DIAGNOSIS — R6521 Severe sepsis with septic shock: Secondary | ICD-10-CM | POA: Diagnosis not present

## 2020-11-04 DIAGNOSIS — L03116 Cellulitis of left lower limb: Secondary | ICD-10-CM | POA: Diagnosis not present

## 2020-11-04 DIAGNOSIS — Z4781 Encounter for orthopedic aftercare following surgical amputation: Secondary | ICD-10-CM | POA: Diagnosis not present

## 2020-11-04 NOTE — Telephone Encounter (Signed)
[place] about Micheal Walls wanting to know the treatment orders on his left great toe amputation. They want to know if they should be using calcium alginate sliver or just regular calcium alginate    Call back Number [phone]

## 2020-11-05 NOTE — Addendum Note (Signed)
Addended by: [name] F on: 11/05/2020 11:04 AM   Modules accepted: Level of Service

## 2020-11-06 DIAGNOSIS — L03116 Cellulitis of left lower limb: Secondary | ICD-10-CM | POA: Diagnosis not present

## 2020-11-06 DIAGNOSIS — Z4781 Encounter for orthopedic aftercare following surgical amputation: Secondary | ICD-10-CM | POA: Diagnosis not present

## 2020-11-06 DIAGNOSIS — A419 Sepsis, unspecified organism: Secondary | ICD-10-CM | POA: Diagnosis not present

## 2020-11-06 DIAGNOSIS — R6521 Severe sepsis with septic shock: Secondary | ICD-10-CM | POA: Diagnosis not present

## 2020-11-06 DIAGNOSIS — Z4801 Encounter for change or removal of surgical wound dressing: Secondary | ICD-10-CM | POA: Diagnosis not present

## 2020-11-06 DIAGNOSIS — N179 Acute kidney failure, unspecified: Secondary | ICD-10-CM | POA: Diagnosis not present

## 2020-11-07 ENCOUNTER — Other Ambulatory Visit: Payer: Self-pay | Admitting: Nurse Practitioner

## 2020-11-09 DIAGNOSIS — L03116 Cellulitis of left lower limb: Secondary | ICD-10-CM | POA: Diagnosis not present

## 2020-11-09 DIAGNOSIS — A419 Sepsis, unspecified organism: Secondary | ICD-10-CM | POA: Diagnosis not present

## 2020-11-09 DIAGNOSIS — Z4801 Encounter for change or removal of surgical wound dressing: Secondary | ICD-10-CM | POA: Diagnosis not present

## 2020-11-09 DIAGNOSIS — N179 Acute kidney failure, unspecified: Secondary | ICD-10-CM | POA: Diagnosis not present

## 2020-11-09 DIAGNOSIS — Z4781 Encounter for orthopedic aftercare following surgical amputation: Secondary | ICD-10-CM | POA: Diagnosis not present

## 2020-11-09 DIAGNOSIS — R6521 Severe sepsis with septic shock: Secondary | ICD-10-CM | POA: Diagnosis not present

## 2020-11-10 ENCOUNTER — Ambulatory Visit (INDEPENDENT_AMBULATORY_CARE_PROVIDER_SITE_OTHER): Payer: Medicare Other | Admitting: Podiatry

## 2020-11-10 ENCOUNTER — Other Ambulatory Visit: Payer: Self-pay

## 2020-11-10 ENCOUNTER — Ambulatory Visit: Payer: Medicare Other | Admitting: Podiatry

## 2020-11-10 ENCOUNTER — Encounter: Payer: Self-pay | Admitting: Podiatry

## 2020-11-10 ENCOUNTER — Telehealth: Payer: Medicare Other

## 2020-11-10 DIAGNOSIS — E1169 Type 2 diabetes mellitus with other specified complication: Secondary | ICD-10-CM | POA: Diagnosis not present

## 2020-11-10 DIAGNOSIS — L97522 Non-pressure chronic ulcer of other part of left foot with fat layer exposed: Secondary | ICD-10-CM | POA: Diagnosis not present

## 2020-11-10 NOTE — Progress Notes (Signed)
  Subjective:  Patient ID: Micheal Walls, male    DOB: June 16, 1935,  MRN: [id number]  Chief Complaint  Patient presents with  . Foot Ulcer     3wk fu for wound care left foot    DOS: 09/14/2020 Procedure: Left hallux amputation  85 y.o. male returns for post-op check.  They have been changing the dressing 3 times weekly at home  Review of Systems: Negative except as noted in the HPI. Denies N/V/F/Ch.   Objective:  There were no vitals filed for this visit. There is no height or weight on file to calculate BMI. Constitutional Well developed. Well nourished.  Vascular Foot warm and well perfused. Capillary refill normal to all digits.   Neurologic Normal speech. Oriented to person, place, and time. Epicritic sensation to light touch grossly present bilaterally.  Dermatologic  amputation site is open ulceration measuring 2.1 x 1.6 x 0.3  Orthopedic:  Minimal tenderness to palpation noted about the surgical site.          Assessment:   1. Ulcer of left foot with fat layer exposed ([place])    Plan:  Patient was evaluated and treated and all questions answered.  Ulcer left foot -Wound bed was prepared and debrided of all devitalized tissue. -PuraPly XT expiration date 09/23/2021 Lot number [id number] 07/15/2001 28.1.1 UO applied and secured with Mepitel 1, Maxorb and DSD.  No dressing changes until next week.   Procedure: Excisional Debridement of Wound Rationale: Removal of non-viable soft tissue from the wound to promote healing.  Anesthesia: none  Post-Debridement Wound Measurements: 2.1 x 1.6 x 0.3 Type of Debridement: Sharp Excisional Tissue Removed: Non-viable soft tissue Depth of Debridement: subcutaneous tissue. Technique: Sharp excisional debridement to bleeding, viable wound base.  Dressing: Dry, sterile, compression dressing. Disposition: Patient tolerated procedure well.      Return in about 1 week (around 11/17/2020) for wound care, graft application.

## 2020-11-10 NOTE — Patient Instructions (Signed)
Do not change dressing until next week, keep clean, dry and intact

## 2020-11-11 ENCOUNTER — Telehealth: Payer: Self-pay | Admitting: *Deleted

## 2020-11-11 DIAGNOSIS — L03116 Cellulitis of left lower limb: Secondary | ICD-10-CM | POA: Diagnosis not present

## 2020-11-11 DIAGNOSIS — R6521 Severe sepsis with septic shock: Secondary | ICD-10-CM | POA: Diagnosis not present

## 2020-11-11 DIAGNOSIS — Z4801 Encounter for change or removal of surgical wound dressing: Secondary | ICD-10-CM | POA: Diagnosis not present

## 2020-11-11 DIAGNOSIS — Z4781 Encounter for orthopedic aftercare following surgical amputation: Secondary | ICD-10-CM | POA: Diagnosis not present

## 2020-11-11 DIAGNOSIS — L97522 Non-pressure chronic ulcer of other part of left foot with fat layer exposed: Secondary | ICD-10-CM

## 2020-11-11 DIAGNOSIS — N179 Acute kidney failure, unspecified: Secondary | ICD-10-CM | POA: Diagnosis not present

## 2020-11-11 DIAGNOSIS — A419 Sepsis, unspecified organism: Secondary | ICD-10-CM | POA: Diagnosis not present

## 2020-11-11 NOTE — Telephone Encounter (Signed)
[facility] is requesting an order to discontinue home health. Patient told them that the doctor had ordered it. Please advise.

## 2020-11-11 NOTE — Telephone Encounter (Signed)
Faxed wound care discontine orders per physician, received success confirmation.

## 2020-11-12 ENCOUNTER — Other Ambulatory Visit: Payer: Self-pay

## 2020-11-12 ENCOUNTER — Ambulatory Visit (INDEPENDENT_AMBULATORY_CARE_PROVIDER_SITE_OTHER): Payer: Medicare Other | Admitting: Medical

## 2020-11-12 ENCOUNTER — Encounter: Payer: Self-pay | Admitting: Medical

## 2020-11-12 VITALS — BP 96/52 | HR 70 | Ht 73.0 in | Wt 254.6 lb

## 2020-11-12 DIAGNOSIS — E782 Mixed hyperlipidemia: Secondary | ICD-10-CM

## 2020-11-12 DIAGNOSIS — N184 Chronic kidney disease, stage 4 (severe): Secondary | ICD-10-CM

## 2020-11-12 DIAGNOSIS — I739 Peripheral vascular disease, unspecified: Secondary | ICD-10-CM

## 2020-11-12 DIAGNOSIS — I10 Essential (primary) hypertension: Secondary | ICD-10-CM | POA: Diagnosis not present

## 2020-11-12 DIAGNOSIS — E1169 Type 2 diabetes mellitus with other specified complication: Secondary | ICD-10-CM | POA: Diagnosis not present

## 2020-11-12 DIAGNOSIS — E669 Obesity, unspecified: Secondary | ICD-10-CM

## 2020-11-12 LAB — BASIC METABOLIC PANEL
BUN/Creatinine Ratio: 33 — ABNORMAL HIGH (ref 10–24)
BUN: 59 mg/dL — ABNORMAL HIGH (ref 8–27)
CO2: 21 mmol/L (ref 20–29)
Calcium: 10.1 mg/dL (ref 8.6–10.2)
Chloride: 109 mmol/L — ABNORMAL HIGH (ref 96–106)
Creatinine, Ser: 1.8 mg/dL — ABNORMAL HIGH (ref 0.76–1.27)
Glucose: 102 mg/dL — ABNORMAL HIGH (ref 65–99)
Potassium: 4.8 mmol/L (ref 3.5–5.2)
Sodium: 141 mmol/L (ref 134–144)
eGFR: 37 mL/min/{1.73_m2} — ABNORMAL LOW (ref >59)

## 2020-11-12 MED ORDER — DILTIAZEM HCL ER COATED BEADS 240 MG PO CP24: 240.0000 mg | ORAL_CAPSULE | Freq: Every day | ORAL | 3 refills | Status: DC

## 2020-11-12 MED ORDER — ROSUVASTATIN CALCIUM 10 MG PO TABS: 10.0000 mg | ORAL_TABLET | Freq: Every day | ORAL | 1 refills | Status: DC

## 2020-11-12 NOTE — Patient Instructions (Addendum)
Medication Instructions:  Decrease Diltiazem to 240 mg (1 Capsule Daily.) Start Crestor 10 mg (1 Tablet Daily.) *If you need a refill on your cardiac medications before your next appointment, please call your pharmacy*   Lab Work: BMP If you have labs (blood work) drawn today and your tests are completely normal, you will receive your results only by: Marland Kitchen MyChart Message (if you have MyChart) OR . A paper copy in the mail If you have any lab test that is abnormal or we need to change your treatment, we will call you to review the results.   Testing/Procedures: No Testing    Follow-Up: At [facility], you and your health needs are our priority.  As part of our continuing mission to provide you with exceptional heart care, we have created designated Provider Care Teams.  These Care Teams include your primary Cardiologist (physician) and Advanced Practice Providers (APPs -  Physician Assistants and Nurse Practitioners) who all work together to provide you with the care you need, when you need it.   Your next appointment:   3 month(s)  The format for your next appointment:   In Person  Provider:   [name], MD   Other Instructions  Please follow a Low Sodium diet. Monitor Blood Pressure if Blood Pressure goes Below 110 or above 130 . Please Call the Office

## 2020-11-12 NOTE — Progress Notes (Signed)
Cardiology Office Note   Date:  11/12/2020   ID:  Micheal Walls, DOB 07/05/1935, MRN [id number]  PCP:  [name], [name]  Cardiologist:  [name], MD EP: None  Chief Complaint  Patient presents with  . Hospitalization Follow-up    Low blood pressures      History of Present Illness: Micheal Walls is a 85 y.o. male with a PMH of PAD s/p right BKA and more recent left 1st toe amputation 09/2020, HTN, DM type 2, CVA, CKD stage 4, and OSA, who presents for hospital follow-up.  He was last evaluated by cardiology at an outpatient visit with Dr. Sallyanne Kuster, at which time he had poorly healing LLE ulcers, likely 2/2 venous insufficiency and possible right heart failure, for which he was referred to the wound center. No medication changes occurred at that visit and he was recommended to follow-up in 1 year. His last echocardiogram 07/2020 showed EF 55-60%, moderate LVH, indeterminate LV diastolic function, no RWMA, mild dilation of the aortic root (75mm), and no significant valvular abnormalities. Since his last visit he was seen by Vascular Surgery and underwent an abdominal aortogram with LE 08/2019 given poorly healing left foot wound which showed tandem stenosis within the anterior tibial artery managed with balloon angioplasty. 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 He ultimately underwent amputation of the left great toe.  He was recently admitted to the hospital from 10/21/20-10/24/20 for left foot cellulitis at the amputation site resulting in sepsis after failing outpatient antibiotics. He was treated with IV>po antibiotics. His hospital course was complicated by a mild AKI, for which his lasix was held 4 days after discharge.   He presents today for post-hospital follow-up with his son. Overall he has done okay since discharge from the hospital. He completed his antibiotics last week and continues to follow closely at the wound clinic. His son asks  about limiting medications and wanted to ensure he was not on any unnecessary medications. We reviewed his medication list in detail. He has had some lower blood pressures recently with SBP often in the 90s, though thankfully has not had any dizziness, lightheadedness, or syncope. He has continued to have LE edema but reports this is overall stable if not improved.     Past Medical History:  Diagnosis Date  . Acute metabolic encephalopathy 0/0/7121  . Amputated below knee ([place])    right  . Anemia   . Aspiration pneumonia ([place]) 11/02/2019  . Diastolic heart failure ([place])   . Diverticulosis   . DM (diabetes mellitus) ([place])   . Gout   . Hyperlipemia   . OSA on CPAP   . RBBB   . Sepsis ([place]) 11/02/2019  . Systemic hypertension     Past Surgical History:  Procedure Laterality Date  . ABDOMINAL AORTOGRAM W/LOWER EXTREMITY Left 08/11/2020   Procedure: ABDOMINAL AORTOGRAM W/LOWER EXTREMITY;  Surgeon: Serafina Mitchell, MD;  Location: [facility];  Service: Cardiovascular;  Laterality: Left;  . AMPUTATION Left 09/14/2020   Procedure: AMPUTATION LEFT GREAT TOE;  Surgeon: [name], DPM;  Location: [facility];  Service: Podiatry;  Laterality: Left;  . LEG AMPUTATION BELOW KNEE     right  . NM MYOCAR PERF WALL MOTION  09/18/2009   no ischemia  . PERIPHERAL VASCULAR BALLOON ANGIOPLASTY Left 08/11/2020   Procedure: PERIPHERAL VASCULAR BALLOON ANGIOPLASTY;  Surgeon: Serafina Mitchell, MD;  Location: [facility];  Service: Cardiovascular;  Laterality: Left;  AT     Current Outpatient Medications  Medication Sig Dispense Refill  . allopurinol (ZYLOPRIM) 100 MG tablet Take 100 mg by mouth daily.    Marland Kitchen aspirin EC 81 MG tablet Take 81 mg by mouth daily. Swallow whole.    . Continuous Blood Gluc Sensor (FREESTYLE LIBRE 14 DAY SENSOR) MISC Use as directed to check blood sugars 6 each 1  . diltiazem (CARDIZEM CD) 240 MG 24 hr capsule Take 1 capsule (240 mg total) by mouth daily. 90 capsule 3   . dorzolamide (TRUSOPT) 2 % ophthalmic solution Place 1 drop into both eyes at bedtime.    . ferrous sulfate 325 (65 FE) MG tablet Take 325-650 mg by mouth See admin instructions. 325mg  on Monday Wednesday Friday 650mg  on Sunday Tuesday Thursday saturday    . furosemide (LASIX) 40 MG tablet Take 2 tabs in morning and 1 tab evening 180 tablet 0  . gabapentin (NEURONTIN) 100 MG capsule Take 2 capsules in am 1 capsule mid day and 1 capsule in evening 270 capsule 0  . glucose blood (FREESTYLE PRECISION NEO TEST) test strip Use as instructed 100 each 12  . latanoprost (XALATAN) 0.005 % ophthalmic solution Place 1 drop into both eyes at bedtime.     . pantoprazole (PROTONIX) 40 MG tablet TAKE 1 TABLET BY MOUTH EVERY DAY 30 tablet 0  . Semaglutide, [name], (OZEMPIC, [name],) 2 MG/1.5ML SOPN Inject 1 mg into the skin once a week. 9 mL 1  . senna-docusate (SENOKOT-S) 8.6-50 MG tablet Take 1 tablet by mouth 2 (two) times daily between meals as needed for mild constipation or moderate constipation. 60 tablet 0  . tamsulosin (FLOMAX) 0.4 MG CAPS capsule Take 0.4 mg by mouth daily.    . rosuvastatin (CRESTOR) 10 MG tablet Take 1 tablet (10 mg total) by mouth daily. 90 tablet 1   No current facility-administered medications for this visit.    Allergies:   Vancomycin    Social History:  The patient  reports that he quit smoking about 19 years ago. His smoking use included cigars. He has never used smokeless tobacco. He reports that he does not drink alcohol and does not use drugs.   Family History:  The patient's family history includes Cancer in his sister; Diabetes in his mother; Heart attack in his father.    ROS:  Please see the history of present illness.   Otherwise, review of systems are positive for none.   All other systems are reviewed and negative.    PHYSICAL EXAM: VS:  BP (!) 96/52 (BP Location: Left Arm, Patient Position: Sitting, Cuff Size: Normal)   Pulse 70   Ht 6\' 1"  (1.854  m)   Wt 254 lb 9.6 oz (115.5 kg)   SpO2 94%   BMI 33.59 kg/m  , BMI Body mass index is 33.59 kg/m. GEN: Sitting in wheelchair in no acute distress HEENT: sclera anicteric Neck: no JVD, carotid bruits, or masses Cardiac: RRR; no murmurs, rubs, or gallops, 2-3+ LLE edema  Respiratory:  clear to auscultation bilaterally, normal work of breathing GI: soft, nontender, nondistended, + BS MS: no deformity or atrophy Skin: warm and dry, chronic venous stasis skin changes to LLE with left foot wrapped - dressing C/D/I Neuro:  Strength and sensation are intact Psych: euthymic mood, full affect   EKG:  EKG is not ordered today.   Recent Labs: 01/23/2020: TSH 2.340 08/11/2020: Magnesium 2.2 10/29/2020: ALT 29; BUN 42; Creatinine, Ser 1.71; Hemoglobin 9.6;  Platelets 240; Potassium 5.3; Sodium 142    Lipid Panel    Component Value Date/Time   CHOL 154 10/29/2020 1156   TRIG 129 10/29/2020 1156   HDL 29 (L) 10/29/2020 1156   CHOLHDL 5.3 (H) 10/29/2020 1156   CHOLHDL 2.4 07/19/2020 0330   VLDL 9 07/19/2020 0330   LDLCALC 102 (H) 10/29/2020 1156      Wt Readings from Last 3 Encounters:  11/12/20 254 lb 9.6 oz (115.5 kg)  10/21/20 270 lb (122.5 kg)  09/11/20 253 lb 9.6 oz (115 kg)      Other studies Reviewed: Additional studies/ records that were reviewed today include:   Echocardiogram 07/2020: 1. Left ventricular ejection fraction, by estimation, is 55 to 60%. The  left ventricle has normal function. The left ventricle has no regional  wall motion abnormalities. There is moderate left ventricular hypertrophy.  Indeterminate diastolic filling due  to E-A fusion.  2. Right ventricular systolic function is normal. The right ventricular  size is normal.  3. The pericardial effusion is posterior to the left ventricle.  4. The mitral valve is abnormal. Trivial mitral valve regurgitation.  5. The aortic valve is tricuspid. Aortic valve regurgitation is not  visualized.  6.  Aortic dilatation noted. There is mild dilatation of the aortic root,  measuring 42 mm.  7. The inferior vena cava is dilated in size with >50% respiratory  variability, suggesting right atrial pressure of 8 mmHg.   Conclusion(s)/Recommendation(s): No intracardiac source of embolism  detected on this transthoracic study. A transesophageal echocardiogram is  recommended to exclude cardiac source of embolism if clinically indicated.     ASSESSMENT AND PLAN:  1. PVD: s/p R BKA and more recently left great toe amputation 09/2020 with subsequent admission for cellulitis of the amputation site 10/2020. Now completed his antibiotics course and is continuing to follow with the wound clinic.  - Continue aspirin and statin - Continue wound clinic and vascular surgery follow-up - Continue lasix   2. HTN: BP 96/52 today - Will decrease diltiazem to $RemoveBefo'240mg'HFiKNkRADmA$  daily  3. HLD: LDL 102 10/2020. He has only been taking his crestor weekly though does not recall when/why this was changed from daily.  - Will increase crestor to $RemoveBe'10mg'cQMICGkdh$  daily  4. DM type 2: A1C 6.3 10/2020; c/b retinopathy, nephropathy, and PAD - Continue management per PCP - on ozempic  5. CKD stage 4: Cr 1.7 10/29/20; followed by Dr. Moshe Cipro - Continue close outpatient monitoring and follow-up with Nephrology - will update BMET today as family is worried he is dehydrated due to dry mouth    Current medicines are reviewed at length with the patient today.  The patient does not have concerns regarding medicines.  The following changes have been made:  As above  Labs/ tests ordered today include:   Orders Placed This Encounter  Procedures  . Basic metabolic panel     Disposition:   FU with Dr. Sallyanne Kuster in 3 months  Signed, Abigail Butts, PA-C  11/12/2020 10:51 AM

## 2020-11-12 NOTE — Progress Notes (Signed)
Does he have home health coming to see him?

## 2020-11-13 DIAGNOSIS — A419 Sepsis, unspecified organism: Secondary | ICD-10-CM | POA: Diagnosis not present

## 2020-11-13 DIAGNOSIS — R6521 Severe sepsis with septic shock: Secondary | ICD-10-CM | POA: Diagnosis not present

## 2020-11-13 DIAGNOSIS — N179 Acute kidney failure, unspecified: Secondary | ICD-10-CM | POA: Diagnosis not present

## 2020-11-13 DIAGNOSIS — Z4781 Encounter for orthopedic aftercare following surgical amputation: Secondary | ICD-10-CM | POA: Diagnosis not present

## 2020-11-13 DIAGNOSIS — Z4801 Encounter for change or removal of surgical wound dressing: Secondary | ICD-10-CM | POA: Diagnosis not present

## 2020-11-13 DIAGNOSIS — L03116 Cellulitis of left lower limb: Secondary | ICD-10-CM | POA: Diagnosis not present

## 2020-11-17 ENCOUNTER — Other Ambulatory Visit: Payer: Self-pay

## 2020-11-17 ENCOUNTER — Ambulatory Visit (INDEPENDENT_AMBULATORY_CARE_PROVIDER_SITE_OTHER): Payer: Medicare Other | Admitting: Podiatry

## 2020-11-17 ENCOUNTER — Telehealth: Payer: Self-pay

## 2020-11-17 DIAGNOSIS — N179 Acute kidney failure, unspecified: Secondary | ICD-10-CM | POA: Diagnosis not present

## 2020-11-17 DIAGNOSIS — Z4781 Encounter for orthopedic aftercare following surgical amputation: Secondary | ICD-10-CM | POA: Diagnosis not present

## 2020-11-17 DIAGNOSIS — L97522 Non-pressure chronic ulcer of other part of left foot with fat layer exposed: Secondary | ICD-10-CM

## 2020-11-17 DIAGNOSIS — L03116 Cellulitis of left lower limb: Secondary | ICD-10-CM | POA: Diagnosis not present

## 2020-11-17 DIAGNOSIS — E1169 Type 2 diabetes mellitus with other specified complication: Secondary | ICD-10-CM | POA: Diagnosis not present

## 2020-11-17 DIAGNOSIS — A419 Sepsis, unspecified organism: Secondary | ICD-10-CM | POA: Diagnosis not present

## 2020-11-17 DIAGNOSIS — R6521 Severe sepsis with septic shock: Secondary | ICD-10-CM | POA: Diagnosis not present

## 2020-11-17 DIAGNOSIS — Z4801 Encounter for change or removal of surgical wound dressing: Secondary | ICD-10-CM | POA: Diagnosis not present

## 2020-11-17 NOTE — Telephone Encounter (Signed)
Spoke with pt, he stated he needs a new prescription for his cup that fits on his leg, the cup is now too large. Left a note on [place] desk.

## 2020-11-19 ENCOUNTER — Telehealth: Payer: Self-pay

## 2020-11-19 ENCOUNTER — Other Ambulatory Visit: Payer: Self-pay

## 2020-11-19 DIAGNOSIS — E1159 Type 2 diabetes mellitus with other circulatory complications: Secondary | ICD-10-CM

## 2020-11-19 DIAGNOSIS — Z794 Long term (current) use of insulin: Secondary | ICD-10-CM

## 2020-11-19 MED ORDER — FREESTYLE LIBRE 14 DAY SENSOR MISC: 1 refills | Status: DC

## 2020-11-19 MED ORDER — OZEMPIC (1 MG/DOSE) 2 MG/1.5ML ~~LOC~~ SOPN: 1.0000 mg | PEN_INJECTOR | SUBCUTANEOUS | 1 refills | Status: DC

## 2020-11-19 NOTE — Chronic Care Management (AMB) (Signed)
Chronic Care Management Pharmacy Assistant   Name: Micheal Walls  MRN: [id number] DOB: 1936-05-08  Reason for Encounter: Medication Review/ Coordination of Enhanced Pharmacy Services  11/19/2020- Follow up with daughter Micheal Walls on patient medication needs and medication price for [facility]. Per daughter, patient saw Cardiologist on 11/12/2020, there were 2 medication changes, Rosuvastatin was changed to 10 mg patient to take 1 tablet daily instead of 1 tablet a week, also patient's Diltiazem was changed from 300 mg to 240 mg- 1 capsule daily. Medications were sent to [facility] for a 90 day supply. Patient also received a refill call for Allopurinol 100 mg, Colchicine 0.5 mg and Flomax 0.4 mg to be picked up from [facility], patient has not picked medications up yet,waiting on final decision with [facility]. Advised daughter that patient's current medications that are on file will be $197 for a 90 day supply. Daughter contacting patient to see if he agrees with cost so we can start adherence packaging system. Daughter has several concerns about patient he is not eating right, having increase gout flare up due to diet, eating more high uric acid foods (steaks), not checking sugars, won't take her advice on healthy food options, her nephew is helping patient out during the weekends but his food choices are not good for patient. Daughter mention she is going back to [place] on Sunday, she was in town these last couple of months helping her friend who had major back surgery. Patient is out of Ozempic dose for this week, [facility], patient is out of Freestyle sensors due to knocking the last one off of his arm (daughter did not know how), daughter expressed her concerns about patient's medication adherence, he picked up new prescriptions from Cardiology but misplaced them so he was taking 300 mg of Diltiazem for a few days, he was able to located new dosage  and started taking. Daughter will contact patient to see if he is ok with the price of medications due to insurance plan and call back. Phone line dropped before we were able to discuss another other inquiries for patient. Daughter called back. She will have nephew pick up medications called in from Dr. Kathrene Bongo- Flomax, [facility]opurinol, [facility]. Informed that I will request Freestyle sensors and [facility] also so he won't be out until she calls me back with decision. Patient has a visit with [name], CPP on 11/24/2020, changing telephone visit to in-person visit and patient requested to bring all of his medication into office for pharmacist assistant. Patient was instructed to check blood pressures daily by Cardiologist, requested those logs to be brought also. Daughter will call me back.   12/07/2020- Called patient daughter, number incorrect, called patient, no answer, left message to return call regarding [facility] medication delivery.   Medications: Outpatient Encounter Medications as of 11/19/2020  Medication Sig Note   allopurinol (ZYLOPRIM) 100 MG tablet Take 100 mg by mouth daily.    aspirin EC 81 MG tablet Take 81 mg by mouth daily. Swallow whole.    Continuous Blood Gluc Sensor (FREESTYLE LIBRE 14 DAY SENSOR) MISC Use as directed to check blood sugars    diltiazem (CARDIZEM CD) 240 MG 24 hr capsule Take 1 capsule (240 mg total) by mouth daily.    dorzolamide (TRUSOPT) 2 % ophthalmic solution Place 1 drop into both eyes at bedtime.    ferrous sulfate 325 (65 FE) MG tablet Take  325-650 mg by mouth See admin instructions. 325mg  on Monday Wednesday Friday 650mg  on Sunday Tuesday Thursday saturday 10/21/2020: Taking 2 tablets Monday, Wednesday and Friday and then 1 tablet on Tuesday, Thursday, Saturday and Sunday.    furosemide (LASIX) 40 MG tablet Take 2 tabs in morning and 1 tab evening    gabapentin (NEURONTIN) 100 MG capsule Take 2 capsules in am 1  capsule mid day and 1 capsule in evening    glucose blood (FREESTYLE PRECISION NEO TEST) test strip Use as instructed    latanoprost (XALATAN) 0.005 % ophthalmic solution Place 1 drop into both eyes at bedtime.     pantoprazole (PROTONIX) 40 MG tablet TAKE 1 TABLET BY MOUTH EVERY DAY    rosuvastatin (CRESTOR) 10 MG tablet Take 1 tablet (10 mg total) by mouth daily.    Semaglutide, [name], (OZEMPIC, [name],) 2 MG/1.5ML SOPN Inject 1 mg into the skin once a week.    senna-docusate (SENOKOT-S) 8.6-50 MG tablet Take 1 tablet by mouth 2 (two) times daily between meals as needed for mild constipation or moderate constipation.    tamsulosin (FLOMAX) 0.4 MG CAPS capsule Take 0.4 mg by mouth daily.    No facility-administered encounter medications on file as of 11/19/2020.    Star Rating Drugs: Ozempic- Last filled 11/20/2020 for 90 day supply at [facility] Rosuvastatin 10 mg- Last filled 09/02/2019    [place], [place] Pharmacist Assistant [phone]

## 2020-11-22 ENCOUNTER — Encounter: Payer: Self-pay | Admitting: Podiatry

## 2020-11-22 NOTE — Progress Notes (Signed)
  Subjective:  Patient ID: Micheal Walls, male    DOB: Apr 21, 1936,  MRN: [id number]  Chief Complaint  Patient presents with   Foot Ulcer     wound care, graft application, 1 week follow up    DOS: 09/14/2020 Procedure: Left hallux amputation  85 y.o. male returns for post-op check.  Here for 1 week follow-up of PuraPly XT application  Review of Systems: Negative except as noted in the HPI. Denies N/V/F/Ch.   Objective:  There were no vitals filed for this visit. There is no height or weight on file to calculate BMI. Constitutional Well developed. Well nourished.  Vascular Foot warm and well perfused. Capillary refill normal to all digits.   Neurologic Normal speech. Oriented to person, place, and time. Epicritic sensation to light touch grossly present bilaterally.  Dermatologic  amputation site is open ulceration measuring 2.2 x 1.3 x 0.3 cm  Orthopedic:  Minimal tenderness to palpation noted about the surgical site.           Assessment:   1. Ulcer of left foot with fat layer exposed ([place])     Plan:  Patient was evaluated and treated and all questions answered.  Ulcer left foot -Wound bed was prepared and debrided of all devitalized tissue. -PuraPly XT expiration date 09/28/2021 Lot number [id number] [id number] applied and secured with Mepitel 1, Maxorb and DSD.  No dressing changes until next week.   Procedure: Excisional Debridement of Wound Rationale: Removal of non-viable soft tissue from the wound to promote healing.  Anesthesia: none  Post-Debridement Wound Measurements: 2.2 x 1.3 x 0.3 Type of Debridement: Sharp Excisional Tissue Removed: Non-viable soft tissue Depth of Debridement: subcutaneous tissue. Technique: Sharp excisional debridement to bleeding, viable wound base.  Dressing: Dry, sterile, compression dressing. Disposition: Patient tolerated procedure well.      No follow-ups on file.

## 2020-11-23 ENCOUNTER — Telehealth: Payer: Self-pay

## 2020-11-23 NOTE — Chronic Care Management (AMB) (Signed)
   No answer, left message of telephone appointment on 11-24-2020 at 8:30. Left message to have all medications, supplements, blood pressure and/or blood sugar logs available during appointment and to return call if need to reschedule.    [place] Pharmacist Assistant [phone]

## 2020-11-24 ENCOUNTER — Ambulatory Visit (INDEPENDENT_AMBULATORY_CARE_PROVIDER_SITE_OTHER): Payer: Medicare Other | Admitting: Podiatry

## 2020-11-24 ENCOUNTER — Encounter: Payer: Self-pay | Admitting: Podiatry

## 2020-11-24 ENCOUNTER — Telehealth: Payer: Medicare Other

## 2020-11-24 ENCOUNTER — Other Ambulatory Visit: Payer: Self-pay

## 2020-11-24 DIAGNOSIS — E1169 Type 2 diabetes mellitus with other specified complication: Secondary | ICD-10-CM

## 2020-11-24 DIAGNOSIS — A419 Sepsis, unspecified organism: Secondary | ICD-10-CM | POA: Diagnosis not present

## 2020-11-24 DIAGNOSIS — L03116 Cellulitis of left lower limb: Secondary | ICD-10-CM | POA: Diagnosis not present

## 2020-11-24 DIAGNOSIS — L97522 Non-pressure chronic ulcer of other part of left foot with fat layer exposed: Secondary | ICD-10-CM

## 2020-11-24 DIAGNOSIS — N179 Acute kidney failure, unspecified: Secondary | ICD-10-CM | POA: Diagnosis not present

## 2020-11-24 DIAGNOSIS — R6521 Severe sepsis with septic shock: Secondary | ICD-10-CM | POA: Diagnosis not present

## 2020-11-24 DIAGNOSIS — Z4801 Encounter for change or removal of surgical wound dressing: Secondary | ICD-10-CM | POA: Diagnosis not present

## 2020-11-24 DIAGNOSIS — Z4781 Encounter for orthopedic aftercare following surgical amputation: Secondary | ICD-10-CM | POA: Diagnosis not present

## 2020-11-24 NOTE — Progress Notes (Deleted)
Chronic Care Management Pharmacy Note  11/24/2020 Name:  Micheal Walls MRN:  [id number] DOB:  January 21, 1936  Summary:   Recommendations/Changes made from today's visit:   Plan:    Subjective: Micheal Walls is an 85 y.o. year old male who is a primary patient of [name], [place].  The CCM team was consulted for assistance with disease management and care coordination needs.    {CCMTELEPHONEFACETOFACE:21091510} for {CCMINITIALFOLLOWUPCHOICE:21091511} in response to provider referral for pharmacy case management and/or care coordination services.   Consent to Services:  {CCMCONSENTOPTIONS:25074}  Patient Care Team: [name], [name] as PCP - General (General Practice) [name], [name], MD as PCP - Cardiology (Cardiology) Rex Kras, Claudette Stapler, RN as [place] as [place], Sharyn Blitz, [facility] (Pharmacist)  Recent office visits:   Recent consult visits:   Hospital visits: {Hospital DC Yes/No:25215}   Objective:  Lab Results  Component Value Date   CREATININE 1.80 (H) 11/12/2020   BUN 59 (H) 11/12/2020   GFRNONAA 35 (L) 10/24/2020   GFRAA 42 (L) 05/20/2020   NA 141 11/12/2020   K 4.8 11/12/2020   CALCIUM 10.1 11/12/2020   CO2 21 11/12/2020   GLUCOSE 102 (H) 11/12/2020    Lab Results  Component Value Date/Time   HGBA1C 6.3 (H) 10/29/2020 11:56 AM   HGBA1C 5.9 (H) 10/21/2020 09:38 PM   MICROALBUR 30 09/28/2020 03:58 PM   MICROALBUR 80 05/15/2019 10:28 AM    Last diabetic Eye exam:  Lab Results  Component Value Date/Time   HMDIABEYEEXA Retinopathy (A) 07/10/2020 12:00 AM    Last diabetic Foot exam: No results found for: HMDIABFOOTEX   Lab Results  Component Value Date   CHOL 154 10/29/2020   HDL 29 (L) 10/29/2020   LDLCALC 102 (H) 10/29/2020   TRIG 129 10/29/2020   CHOLHDL 5.3 (H) 10/29/2020    Hepatic Function Latest Ref Rng & Units 10/29/2020 10/21/2020 09/28/2020   Total Protein 6.0 - 8.5 g/dL 7.2 7.6 7.6  Albumin 3.6 - 4.6 g/dL 3.3(L) 3.3(L) 3.9  AST 0 - 40 IU/L 22 21 22   ALT 0 - 44 IU/L 29 22 27   Alk Phosphatase 44 - 121 IU/L 82 68 92  Total Bilirubin 0.0 - 1.2 mg/dL 0.3 0.4 0.3  Bilirubin, Direct 0.00 - 0.40 mg/dL - - -    Lab Results  Component Value Date/Time   TSH 2.340 01/23/2020 02:54 PM   TSH 2.760 03/21/2019 10:35 AM    CBC Latest Ref Rng & Units 10/29/2020 10/24/2020 10/23/2020  WBC 3.4 - 10.8 x10E3/uL 6.4 8.8 11.2(H)  Hemoglobin 13.0 - 17.7 g/dL 9.6(L) 8.1(L) 8.4(L)  Hematocrit 37.5 - 51.0 % 29.6(L) 26.1(L) 27.0(L)  Platelets 150 - 450 x10E3/uL 240 206 183    No results found for: VD25OH  Clinical ASCVD: {YES/NO:21197} The ASCVD Risk score [name] DC Jr., et al., 2013) failed to calculate for the following reasons:   The 2013 ASCVD risk score is only valid for ages 74 to 64   The patient has a prior MI or stroke diagnosis    Depression screen [facility] 2/9 05/20/2020 09/24/2019 06/25/2019  Decreased Interest 0 0 0  Down, Depressed, Hopeless 0 0 0  PHQ - 2 Score 0 0 0  Altered sleeping - - -  Tired, decreased energy - - -  Change in appetite - - -  Feeling bad or failure about yourself  - - -  Trouble concentrating - - -  Moving slowly or fidgety/restless - - -  Suicidal thoughts - - -  PHQ-9 Score - - -  Difficult doing work/chores - - -  Some recent data might be hidden     ***Other: (CHADS2VASc if Afib, MMRC or CAT for COPD, ACT, DEXA)  Social History   Tobacco Use  Smoking Status Former   Pack years: 0.00   Types: Cigars   Quit date: 06/12/2001   Years since quitting: 19.4  Smokeless Tobacco Never   BP Readings from Last 3 Encounters:  11/12/20 (!) 96/52  10/29/20 110/62  10/24/20 (!) 122/56   Pulse Readings from Last 3 Encounters:  11/12/20 70  10/29/20 (!) 59  10/24/20 72   Wt Readings from Last 3 Encounters:  11/12/20 254 lb 9.6 oz (115.5 kg)  10/21/20 270 lb (122.5 kg)  09/11/20 253 lb 9.6 oz (115 kg)    BMI Readings from Last 3 Encounters:  11/12/20 33.59 kg/m  10/21/20 35.62 kg/m  09/11/20 33.46 kg/m    Assessment/Interventions: Review of patient past medical history, allergies, medications, health status, including review of consultants reports, laboratory and other test data, was performed as part of comprehensive evaluation and provision of chronic care management services.   SDOH:  (Social Determinants of Health) assessments and interventions performed: {yes/no:20286}  SDOH Screenings   Alcohol Screen: Not on file  Depression (PHQ2-9): Low Risk    PHQ-2 Score: 0  Financial Resource Strain: Low Risk    Difficulty of Paying Living Expenses: Not hard at all  Food Insecurity: No Food Insecurity   Worried About Charity fundraiser in the Last Year: Never true   Ran Out of Food in the Last Year: Never true  Housing: Not on file  Physical Activity: Inactive   Days of Exercise per Week: 0 days   Minutes of Exercise per Session: 0 min  Social Connections: Not on file  Stress: No Stress Concern Present   Feeling of Stress : Not at all  Tobacco Use: Medium Risk   Smoking Tobacco Use: Former   Smokeless Tobacco Use: Never  Transportation Needs: No Transportation Needs   Lack of Transportation (Medical): No   Lack of Transportation (Non-Medical): No    CCM Care Plan  Allergies  Allergen Reactions   Vancomycin Rash    Medications Reviewed Today     Reviewed by [name], DPM (Physician) on 11/22/20 at 1431  Med List Status: <None>   Medication Order Taking? Sig Documenting Provider Last Dose Status Informant  allopurinol (ZYLOPRIM) 100 MG tablet [id number] No Take 100 mg by mouth daily. [provider] Taking Active Child           Med Note Fransico Him Oct 22, 2020  9:51 AM)    aspirin EC 81 MG tablet [id number] No Take 81 mg by mouth daily. Swallow whole. [provider] Taking Active Child  Continuous Blood Gluc Sensor (FREESTYLE  LIBRE 14 DAY SENSOR) [place] [id number]  Use as directed to check blood sugars [name], [name]  Active   diltiazem (CARDIZEM CD) 240 MG 24 hr capsule [id number]  Take 1 capsule (240 mg total) by mouth daily. Kroeger, Daleen Snook M., PA-C  Active   dorzolamide (TRUSOPT) 2 % ophthalmic solution [id number] No Place 1 drop into both eyes at bedtime. [provider] Taking Active Child  ferrous sulfate 325 (65 FE) MG tablet [id number] No Take 325-650 mg by mouth See admin instructions. 335m on Monday Wednesday Friday 6551m  on Sunday Tuesday Thursday saturday [provider] Taking Active Child           Med Note Jeoffrey Massed Oct 21, 2020 10:21 AM) Taking 2 tablets Monday, Wednesday and Friday and then 1 tablet on Tuesday, Thursday, Saturday and Sunday.   furosemide (LASIX) 40 MG tablet [id number] No Take 2 tabs in morning and 1 tab evening [name], [name], MD Taking Active   gabapentin (NEURONTIN) 100 MG capsule [id number] No Take 2 capsules in am 1 capsule mid day and 1 capsule in evening [name], [name] Taking Active   glucose blood (FREESTYLE PRECISION NEO TEST) test strip [id number] No Use as instructed [name], [name] Taking Active Child  latanoprost (XALATAN) 0.005 % ophthalmic solution [id number] No Place 1 drop into both eyes at bedtime.  [provider] Taking Active Child           Med Note Fransico Him Oct 22, 2020 10:00 AM)    pantoprazole (PROTONIX) 40 MG tablet [id number] No TAKE 1 TABLET BY MOUTH EVERY DAY [name], [name] Taking Active   rosuvastatin (CRESTOR) 10 MG tablet [id number]  Take 1 tablet (10 mg total) by mouth daily. Kroeger, Lorelee Cover., PA-C  Active   Semaglutide, [name], (OZEMPIC, [name],) 2 MG/1.5ML SOPN [id number]  Inject 1 mg into the skin once a week. [name], [name]  Active   senna-docusate (SENOKOT-S) 8.6-50 MG tablet [id number] No Take 1 tablet by mouth 2 (two) times daily between meals as needed for mild constipation or  moderate constipation. Mercy Riding, MD Taking Active   tamsulosin (FLOMAX) 0.4 MG CAPS capsule [id number] No Take 0.4 mg by mouth daily. 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 ([place]) 10/21/2020   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[place])    Left ankle swelling    Diabetic foot infection ([place])    Cerebral thrombosis with cerebral infarction 07/20/2020   Thrombocytopenia ([place]) 07/19/2020   Cerebral ischemia 07/18/2020   Bilateral hearing loss 05/20/2020   Does use hearing aid 05/20/2020   Diabetic ulcer of left great toe ([place]) 01/16/2020   Controlled type 2 diabetes mellitus with stable proliferative retinopathy of both eyes, with long-term current use of insulin ([place]) 01/06/2020   Intermediate stage nonexudative age-related macular degeneration of both eyes 01/06/2020   Left epiretinal membrane 01/06/2020   [place] of right macula 01/06/2020   AKI (acute kidney injury) ([place]) 11/02/2019   Cellulitis 11/02/2019   Elevated TSH 07/10/2018   Nephropathy 02/28/2018   Disturbance of skin sensation 02/28/2018   PAD (peripheral artery disease) ([place]) 07/17/2017   110.1 10/21/2013   Epiphora 04/24/2013   Laxity of eyelid 04/24/2013   Anemia 04/09/2013   History of peripheral vascular disease 04/09/2013   History of stroke 04/09/2013   S/P unilateral BKA (below knee amputation) ([place]) 03/17/2013   Diabetes mellitus type 2 in obese ([place]) 03/17/2013   Mixed hyperlipidemia 03/17/2013   Essential hypertension 03/17/2013   Gout 03/17/2013   CKD (chronic kidney disease), stage III ([place]) 03/17/2013   OSA on CPAP 03/17/2013   Anemia, iron deficiency 03/17/2013   Diverticulosis of colon 03/17/2013   RBBB 03/17/2013   Punctal stenosis, acquired 10/02/2012    Immunization History  Administered Date(s) Administered   Fluad Quad(high Dose 65+) 05/20/2020   Influenza,  High Dose  Seasonal PF 03/21/2019   PFIZER(Purple Top)SARS-COV-2 Vaccination 08/31/2019, 09/25/2019, 04/03/2020   Pneumococcal Conjugate-13 02/23/2016   Pneumococcal Polysaccharide-23 05/15/2019   Tdap 02/04/2013    Conditions to be addressed/monitored:  {USCCMDZASSESSMENTOPTIONS:23563}  There are no care plans that you recently modified to display for this patient.    Medication Assistance: {MEDASSISTANCEINFO:25044}  Compliance/Adherence/Medication fill history: Care Gaps:   Star-Rating Drugs:   Patient's preferred pharmacy [facility]:  [id number] [place] [address]Phone: [phone]:: [phone] [facility] [place]- [address]Phone: [phone]: [phone] [facility] [place]- [address] [place]: [phone]: [phone] [facility] [place]- [address] [place]Phone: [phone]: [phone] Uses pill box? {Yes or If no, why not?:20788} Pt endorses ***% compliance  We discussed: {Pharmacy options:24294} Patient decided to: [facility] Care Plan and Follow Up Patient Decision:  {FOLLOWUP:24991}  Plan: {CM FOLLOW UP PJNGW:37023}

## 2020-11-24 NOTE — Progress Notes (Signed)
  Subjective:  Patient ID: Micheal Walls, male    DOB: May 12, 1936,  MRN: [id number]  Chief Complaint  Patient presents with   Foot Ulcer    sign consent review bunion surgery right foot .    DOS: 09/14/2020 Procedure: Left hallux amputation  85 y.o. male returns for post-op check.  Here for 1 week follow-up of PuraPly XT application  Review of Systems: Negative except as noted in the HPI. Denies N/V/F/Ch.   Objective:  There were no vitals filed for this visit. There is no height or weight on file to calculate BMI. Constitutional Well developed. Well nourished.  Vascular Foot warm and well perfused. Capillary refill normal to all digits.   Neurologic Normal speech. Oriented to person, place, and time. Epicritic sensation to light touch grossly present bilaterally.  Dermatologic  amputation site is open ulceration measuring 2.1 x 1.2 x 0.3 cm primarily granular wound bed, much improved since last visit.  Minor maceration.  Orthopedic:  Minimal tenderness to palpation noted about the surgical site.            Assessment:   1. Ulcer of left foot with fat layer exposed ([place])     Plan:  Patient was evaluated and treated and all questions answered.  Ulcer left foot -Wound bed was prepared and debrided of all devitalized tissue. -PuraPly XT expiration date 09/28/2021 Lot number [id number] applied and secured with Mepitel 1, Maxorb and DSD.  10/25 units used today, they were cut to size and fit to prevent overlapping and increased maceration, did not want to double layer them for this.  No dressing changes until next week.   Procedure: Excisional Debridement of Wound Rationale: Removal of non-viable soft tissue from the wound to promote healing.  Anesthesia: none  Post-Debridement Wound Measurements: 2.1 x 1.2 x 0.3 cm Type of Debridement: Sharp Excisional Tissue Removed: Non-viable soft tissue Depth of Debridement: subcutaneous tissue. Technique: Sharp  excisional debridement to bleeding, viable wound base.  Dressing: Dry, sterile, compression dressing. Disposition: Patient tolerated procedure well.      Return in about 1 week (around 12/01/2020) for wound care, graft application .

## 2020-11-26 ENCOUNTER — Encounter: Payer: Self-pay | Admitting: Nurse Practitioner

## 2020-12-01 ENCOUNTER — Ambulatory Visit (INDEPENDENT_AMBULATORY_CARE_PROVIDER_SITE_OTHER): Payer: Medicare Other | Admitting: Podiatry

## 2020-12-01 ENCOUNTER — Encounter: Payer: Self-pay | Admitting: Podiatry

## 2020-12-01 ENCOUNTER — Other Ambulatory Visit: Payer: Self-pay

## 2020-12-01 VITALS — BP 105/82 | HR 84 | Temp 97.7°F

## 2020-12-01 DIAGNOSIS — L97522 Non-pressure chronic ulcer of other part of left foot with fat layer exposed: Secondary | ICD-10-CM

## 2020-12-02 ENCOUNTER — Encounter: Payer: Self-pay | Admitting: Podiatry

## 2020-12-02 NOTE — Progress Notes (Signed)
  Subjective:  Patient ID: Micheal Walls, male    DOB: Jul 14, 1935,  MRN: [id number]  Chief Complaint  Patient presents with   Wound Check    Left foot ulcer - denies any f/c/v/n/sob/cp- no pain- pt reports he is keeping area clean and dry.  There was drainage in bandage when removing it today, slight odor. Further evaluation     DOS: 09/14/2020 Procedure: Left hallux amputation  85 y.o. male returns for post-op check.  Here for 1 week follow-up of PuraPly XT application  Review of Systems: Negative except as noted in the HPI. Denies N/V/F/Ch.   Objective:   Vitals:   12/01/20 1034  BP: 105/82  Pulse: 84  Temp: 97.7 F (36.5 C)   There is no height or weight on file to calculate BMI. Constitutional Well developed. Well nourished.  Vascular Foot warm and well perfused. Capillary refill normal to all digits.   Neurologic Normal speech. Oriented to person, place, and time. Epicritic sensation to light touch grossly present bilaterally.  Dermatologic  amputation site is open ulceration measuring two-point x 1.3 x 0.2 cm primarily granular wound bed,  Orthopedic:  Minimal tenderness to palpation noted about the surgical site.             Assessment:   1. Ulcer of left foot with fat layer exposed ([place])     Plan:  Patient was evaluated and treated and all questions answered.  Ulcer left foot -Wound bed was prepared and debrided of all devitalized tissue. -PuraPly AM expiration date 12/14/2022 Lot number [id number] applied and secured with Mepitel 1, Cosmopor adhesive dressing and ACE.  11/15 units used today, they were cut to size and fit to prevent overlapping and increased maceration, double layer applied.  No dressing changes until next week.   Procedure: Excisional Debridement of Wound Rationale: Removal of non-viable soft tissue from the wound to promote healing.  Anesthesia: none  Post-Debridement Wound Measurements: 2.0 x 1.3 x 0.2 Type of  Debridement: Sharp Excisional Tissue Removed: Non-viable soft tissue Depth of Debridement: subcutaneous tissue. Technique: Sharp excisional debridement to bleeding, viable wound base.  Dressing: Dry, sterile, compression dressing. Disposition: Patient tolerated procedure well.      Return in about 1 week (around 12/08/2020) for wound care, Graft application to wound.

## 2020-12-04 ENCOUNTER — Other Ambulatory Visit: Payer: Self-pay | Admitting: Nurse Practitioner

## 2020-12-08 ENCOUNTER — Other Ambulatory Visit: Payer: Self-pay | Admitting: Nurse Practitioner

## 2020-12-08 ENCOUNTER — Ambulatory Visit (INDEPENDENT_AMBULATORY_CARE_PROVIDER_SITE_OTHER): Payer: Medicare Other | Admitting: Podiatry

## 2020-12-08 ENCOUNTER — Other Ambulatory Visit: Payer: Self-pay

## 2020-12-08 DIAGNOSIS — L97522 Non-pressure chronic ulcer of other part of left foot with fat layer exposed: Secondary | ICD-10-CM

## 2020-12-08 DIAGNOSIS — E1169 Type 2 diabetes mellitus with other specified complication: Secondary | ICD-10-CM

## 2020-12-08 NOTE — Progress Notes (Signed)
  Subjective:  Patient ID: Micheal Walls, male    DOB: 1935/08/20,  MRN: [id number]  Chief Complaint  Patient presents with   Foot Ulcer       wound care, graft application    DOS: 11/19/6166 Procedure: Left hallux amputation  85 y.o. male returns for post-op check.  Here for 1 week follow-up of PuraPly AM application  Review of Systems: Negative except as noted in the HPI. Denies N/V/F/Ch.   Objective:   There were no vitals filed for this visit.  There is no height or weight on file to calculate BMI. Constitutional Well developed. Well nourished.  Vascular Foot warm and well perfused. Capillary refill normal to all digits.   Neurologic Normal speech. Oriented to person, place, and time. Epicritic sensation to light touch grossly present bilaterally.  Dermatologic  amputation site is open ulceration measuring 2.0 x 1.1 x 0.6 primarily granular wound bed,  Orthopedic:  Minimal tenderness to palpation noted about the surgical site.              Assessment:   No diagnosis found.   Plan:  Patient was evaluated and treated and all questions answered.  Ulcer left foot -Wound bed was prepared and debrided of all devitalized tissue.  Aggressive debridement today to reduce the rolled edges of skin -PuraPly XT expiration date 10/11/2021 Lot number [id number] applied and secured with Mepitel 1, Cosmopor adhesive dressing [place] and ACE.  10/25 units used today, they were cut to size and fit to prevent overlapping and increased maceration, double layer applied.  No dressing changes until next week.   Procedure: Excisional Debridement of Wound Rationale: Removal of non-viable soft tissue from the wound to promote healing.  Anesthesia: none  Post-Debridement Wound Measurements: 2.0 x 1.1 x0.6 Type of Debridement: Sharp Excisional Tissue Removed: Non-viable soft tissue Depth of Debridement: subcutaneous tissue. Technique: Sharp excisional debridement to bleeding,  viable wound base.  Dressing: Dry, sterile, compression dressing. Disposition: Patient tolerated procedure well.      Return in about 9 days (around 06/13/1550) for Graft application to wound.

## 2020-12-09 ENCOUNTER — Encounter: Payer: Self-pay | Admitting: Podiatry

## 2020-12-10 ENCOUNTER — Other Ambulatory Visit: Payer: Self-pay | Admitting: Podiatry

## 2020-12-10 ENCOUNTER — Other Ambulatory Visit: Payer: Self-pay | Admitting: Nurse Practitioner

## 2020-12-10 MED ORDER — HYDROCODONE-ACETAMINOPHEN 5-325 MG PO TABS: 1.0000 | ORAL_TABLET | Freq: Two times a day (BID) | ORAL | 0 refills | Status: DC | PRN

## 2020-12-16 ENCOUNTER — Telehealth: Payer: Self-pay

## 2020-12-16 DIAGNOSIS — Z20822 Contact with and (suspected) exposure to covid-19: Secondary | ICD-10-CM | POA: Diagnosis not present

## 2020-12-16 NOTE — Chronic Care Management (AMB) (Signed)
Chronic Care Management Pharmacy Assistant   Name: Micheal Walls  MRN: [id number] DOB: 11/12/1935   Reason for Encounter: Disease State and Medication Review. Diabetes and medication coordination  Recent office visits:  10-27-2020 Micheal Logan, RN (CCM)  10-29-2020 Micheal Walls, [place]. STOP Preservision Areds multivitamin.  Recent consult visits:  10-30-2020 Micheal Walls, DPM. Toenails trimmed. Return in 3 months.  11-10-2020 Micheal Walls, DPM. Wound care. Return in 1 week.  11-12-2020 Walls, Micheal Cover., PA-C (Cardiology). START diltiazem 240 mg daily. STOP plavix 75 mg. STOP Linezolid 600 mg twice daily. STOP cefdinir 300 mg twice daily.   11-17-2020 Micheal Walls, DPM. Wound care. Return in 1 week.  11-24-2020 Micheal Walls, DPM. Wound care. Return in 1 week.  12-01-2020 Micheal Walls, DPM. Wound care. Return in 1 week.  12-08-2020 Micheal Walls, DPM.Wound care. Return in 9 days.   Hospital visits:  Medication Reconciliation was completed by comparing discharge summary, patient's EMR and Pharmacy list, and upon discussion with patient.  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 Discharged from [facility]?Medications Started at [facility] Discharge:?? -started Cefdinir 300 mg twice daily. Linezolid 600 mg twice daily. Senna-Docusate 8.6-50 mg 2 times daily between meals PRN for mild constipation, moderate constipation.  Medication Changes at Hospital Discharge: -Changed Lasix 40 mg from one tablet twice daily to two tablets in the morning and one tablet in evening.  Medications Discontinued at Hospital Discharge: -Stopped amoxicillin-clavulanate 875-125 MG tablet  Biotin 5000 MCG Caps doxycycline 100 MG tablet  fish oil-omega-3 fatty acids 1000 MG capsule Magnesium 250 MG Tabs magnesium citrate Soln magnesium hydroxide 400 MG/5ML  suspension  multivitamin with minerals tablet  Medications that remain the same after Hospital Discharge:??  -All other medications will remain the same.    Medications: Outpatient Encounter Medications as of 12/16/2020  Medication Sig Note   allopurinol (ZYLOPRIM) 100 MG tablet Take 100 mg by mouth daily.    aspirin EC 81 MG tablet Take 81 mg by mouth daily. Swallow whole.    Continuous Blood Gluc Sensor (FREESTYLE LIBRE 14 DAY SENSOR) MISC Use as directed to check blood sugars    diltiazem (CARDIZEM CD) 240 MG 24 hr capsule Take 1 capsule (240 mg total) by mouth daily.    dorzolamide (TRUSOPT) 2 % ophthalmic solution Place 1 drop into both eyes at bedtime.    ferrous sulfate 325 (65 FE) MG tablet Take 325-650 mg by mouth See admin instructions. 325mg  on Monday Wednesday Friday 650mg  on Sunday Tuesday Thursday saturday 10/21/2020: Taking 2 tablets Monday, Wednesday and Friday and then 1 tablet on Tuesday, Thursday, Saturday and Sunday.    furosemide (LASIX) 40 MG tablet TAKE 2 TABS IN MORNING AND 1 TAB EVENING    gabapentin (NEURONTIN) 100 MG capsule Take 2 capsules in am 1 capsule mid day and 1 capsule in evening    glucose blood (FREESTYLE PRECISION NEO TEST) test strip Use as instructed    HYDROcodone-acetaminophen (NORCO/VICODIN) 5-325 MG tablet Take 1 tablet by mouth 2 (two) times daily as needed for severe pain. Take one to two tablets every six to eight hours as needed for pain.    latanoprost (XALATAN) 0.005 % ophthalmic solution Place 1 drop into both eyes at bedtime.     pantoprazole (PROTONIX) 40 MG tablet TAKE 1 TABLET BY MOUTH EVERY DAY    rosuvastatin (CRESTOR)  10 MG tablet Take 1 tablet (10 mg total) by mouth daily.    Semaglutide, [name], (OZEMPIC, [name],) 2 MG/1.5ML SOPN Inject 1 mg into the skin once a week.    senna-docusate (SENOKOT-S) 8.6-50 MG tablet Take 1 tablet by mouth 2 (two) times daily between meals as needed for mild constipation or moderate constipation.     tamsulosin (FLOMAX) 0.4 MG CAPS capsule Take 0.4 mg by mouth daily.    No facility-administered encounter medications on file as of 12/16/2020.   Recent Relevant Labs: Lab Results  Component Value Date/Time   HGBA1C 6.3 (H) 10/29/2020 11:56 AM   HGBA1C 5.9 (H) 10/21/2020 09:38 PM   MICROALBUR 30 09/28/2020 03:58 PM   MICROALBUR 80 05/15/2019 10:28 AM    Kidney Function Lab Results  Component Value Date/Time   CREATININE 1.80 (H) 11/12/2020 11:42 AM   CREATININE 1.71 (H) 10/29/2020 11:56 AM   CREATININE 1.85 (H) 08/27/2020 03:25 PM   GFRNONAA 35 (L) 10/24/2020 03:25 AM   GFRAA 42 (L) 05/20/2020 09:51 AM    Current antihyperglycemic regimen:  Ozempic 1 mg weekly.  What recent interventions/DTPs have been made to improve glycemic control:  Counseled to check feet daily and get yearly eye exams Recommended to continue current medication Counseled on the importance of CGM  Have there been any recent hospitalizations or ED visits since last visit with CPP? Yes  Patient reports hypoglycemic symptoms, including Sweaty, Shaky, Nervous/irritable, and Vision changes  Patient denies hyperglycemic symptoms  How often are you checking your blood sugar? twice daily  What are your blood sugars ranging?  Fasting: 96,98, 107, 114, 108, 99 Before meals: None After meals: 112 Bedtime: None  During the week, how often does your blood glucose drop below 70? Never  Are you checking your feet daily/regularly? Patient states daily  Adherence Review: Is the patient currently on a STATIN medication? Yes Is the patient currently on ACE/ARB medication? No Does the patient have >5 day gap between last estimated fill dates? No  NOTES: Patient still wants to switch over to [facility] but has an abundance amount of medications. Micheal Walls PTM suggested dropping medications off at the office to count and then update everything with pharmacist. Spoke with patient's daughter and she will  contact me once she finds a good time to drop medications off on Tuesday next week and a good day for follow up appointment with Micheal Walls CPP.   Care gaps: 4.948% Covid vaccine due  Shingrix due Yearly foot exam due AWV due 05-20-2021  Star Rating Drugs: Ozempic 1 mg- [facility]2020 [facility]  Rosuvastatin 10 mg- Last filled 11-12-2020 [facility]  [place] Clinical Pharmacist Assistant [phone]

## 2020-12-17 ENCOUNTER — Ambulatory Visit (INDEPENDENT_AMBULATORY_CARE_PROVIDER_SITE_OTHER): Payer: Medicare Other | Admitting: Podiatry

## 2020-12-17 ENCOUNTER — Other Ambulatory Visit: Payer: Self-pay

## 2020-12-17 ENCOUNTER — Encounter: Payer: Self-pay | Admitting: Podiatry

## 2020-12-17 DIAGNOSIS — L97522 Non-pressure chronic ulcer of other part of left foot with fat layer exposed: Secondary | ICD-10-CM | POA: Diagnosis not present

## 2020-12-17 DIAGNOSIS — E1169 Type 2 diabetes mellitus with other specified complication: Secondary | ICD-10-CM | POA: Diagnosis not present

## 2020-12-20 ENCOUNTER — Encounter: Payer: Self-pay | Admitting: Podiatry

## 2020-12-20 NOTE — Progress Notes (Signed)
  Subjective:  Patient ID: Micheal Walls, male    DOB: 11-09-1935,  MRN: [id number]  Chief Complaint  Patient presents with   Foot Ulcer    Left foot wound care and graft application    DOS: 01/13/7289 Procedure: Left hallux amputation  85 y.o. male returns for post-op check.    Review of Systems: Negative except as noted in the HPI. Denies N/V/F/Ch.   Objective:   There were no vitals filed for this visit.  There is no height or weight on file to calculate BMI. Constitutional Well developed. Well nourished.  Vascular Foot warm and well perfused. Capillary refill normal to all digits.   Neurologic Normal speech. Oriented to person, place, and time. Epicritic sensation to light touch grossly present bilaterally.  Dermatologic  amputation site is open ulceration measuring 1.9 x 1.1 x 0.5 primarily granular wound bed, slight maceration  Orthopedic:  Minimal tenderness to palpation noted about the surgical site.     Assessment:   1. Ulcer of left foot with fat layer exposed ([place])      Plan:  Patient was evaluated and treated and all questions answered.  Ulcer left foot -Wound bed was prepared and debrided of all devitalized tissue.  Aggressive debridement today to reduce the rolled edges of skin -Affinity placental membrane graft was applied donor #[id number] expiration date 12/18/2020.  7 units used.  Applied and secured with Mepitel 1, dry sterile gauze dressing Coban and ACE.     Procedure: Excisional Debridement of Wound Rationale: Removal of non-viable soft tissue from the wound to promote healing.  Anesthesia: none  Post-Debridement Wound Measurements: 1.9 x 1.1 x0.5 cm Type of Debridement: Sharp Excisional Tissue Removed: Non-viable soft tissue Depth of Debridement: subcutaneous tissue. Technique: Sharp excisional debridement to bleeding, viable wound base.  Dressing: Dry, sterile, compression dressing. Disposition: Patient tolerated procedure well.       Return in about 1 week (around 12/24/2020) for wound care, Graft application to wound.

## 2020-12-22 ENCOUNTER — Telehealth: Payer: Self-pay

## 2020-12-22 NOTE — Chronic Care Management (AMB) (Signed)
Patient's daughter called to confirm time to drop off medications to Micheal Walls PTM to count today. Per Micheal Walls 1:00 is a good time to drop meds off today. Patient's daughter wanted me to confirm which provider prescribed each medication. Went through each medication with daughter.   [place] Pharmacist Assistant [phone]

## 2020-12-24 ENCOUNTER — Ambulatory Visit (INDEPENDENT_AMBULATORY_CARE_PROVIDER_SITE_OTHER): Payer: Medicare Other | Admitting: Podiatry

## 2020-12-24 ENCOUNTER — Other Ambulatory Visit: Payer: Self-pay

## 2020-12-24 DIAGNOSIS — L97522 Non-pressure chronic ulcer of other part of left foot with fat layer exposed: Secondary | ICD-10-CM

## 2020-12-28 NOTE — Progress Notes (Signed)
  Subjective:  Patient ID: Micheal Walls, male    DOB: 07-Nov-1935,  MRN: [id number]  Chief Complaint  Patient presents with   Foot Ulcer    wound care, graft application    DOS: 10/19/1636 Procedure: Left hallux amputation  85 y.o. male returns for post-op check.    Review of Systems: Negative except as noted in the HPI. Denies N/V/F/Ch.   Objective:   There were no vitals filed for this visit.  There is no height or weight on file to calculate BMI. Constitutional Well developed. Well nourished.  Vascular Foot warm and well perfused. Capillary refill normal to all digits.   Neurologic Normal speech. Oriented to person, place, and time. Epicritic sensation to light touch grossly present bilaterally.  Dermatologic  amputation site is open ulceration measuring 2.2 x 1.5 x 0.3 postdebridement (aggressive debridement today to eliminate rolled skin edges) primarily granular wound bed, slight maceration  Orthopedic:  Minimal tenderness to palpation noted about the surgical site.      Assessment:   1. Ulcer of left foot with fat layer exposed ([place])      Plan:  Patient was evaluated and treated and all questions answered.  Ulcer left foot -Wound bed was prepared and debrided of all devitalized tissue.  Aggressive debridement today to reduce the rolled edges of skin -Affinity placental membrane graft was applied donor #[id number] expiration date 12/25/2020.  7 units used.  Applied and secured with Mepitel 1, dry sterile gauze dressing Coban and ACE.     Procedure: Excisional Debridement of Wound Rationale: Removal of non-viable soft tissue from the wound to promote healing.  Anesthesia: none  Post-Debridement Wound Measurements: 2.2 x 1.5 x 0.3 cm Type of Debridement: Sharp Excisional Tissue Removed: Non-viable soft tissue Depth of Debridement: subcutaneous tissue. Technique: Sharp excisional debridement to bleeding, viable wound base.  Dressing: Dry, sterile, compression  dressing. Disposition: Patient tolerated procedure well.      Return in about 1 week (around 12/31/2020) for wound care, Graft application to wound.

## 2020-12-30 ENCOUNTER — Ambulatory Visit: Payer: Medicare Other | Admitting: Nurse Practitioner

## 2020-12-30 ENCOUNTER — Telehealth: Payer: Self-pay

## 2020-12-31 ENCOUNTER — Ambulatory Visit (INDEPENDENT_AMBULATORY_CARE_PROVIDER_SITE_OTHER): Payer: Medicare Other | Admitting: Podiatry

## 2020-12-31 ENCOUNTER — Other Ambulatory Visit: Payer: Self-pay

## 2020-12-31 ENCOUNTER — Encounter: Payer: Self-pay | Admitting: Podiatry

## 2020-12-31 DIAGNOSIS — L97522 Non-pressure chronic ulcer of other part of left foot with fat layer exposed: Secondary | ICD-10-CM

## 2020-12-31 MED ORDER — GENTAMICIN SULFATE 0.1 % EX CREA
1.0000 | TOPICAL_CREAM | Freq: Every day | CUTANEOUS | 0 refills | Status: DC
Start: 2020-12-31 — End: 2021-01-29

## 2020-12-31 NOTE — Patient Instructions (Addendum)
Change dressing daily with gentamicin to wound bed and betadine to wound periphery and gauze or adhesive bandage daily

## 2021-01-02 ENCOUNTER — Other Ambulatory Visit: Payer: Self-pay | Admitting: Nurse Practitioner

## 2021-01-04 ENCOUNTER — Telehealth: Payer: Medicare Other

## 2021-01-04 NOTE — Progress Notes (Signed)
  Subjective:  Patient ID: Micheal Walls, male    DOB: 1935-06-23,  MRN: [id number]  Chief Complaint  Patient presents with   Foot Ulcer      wound care, graft application    DOS: 01/17/7736 Procedure: Left hallux amputation  85 y.o. male returns for post-op check.    Review of Systems: Negative except as noted in the HPI. Denies N/V/F/Ch.   Objective:   There were no vitals filed for this visit.  There is no height or weight on file to calculate BMI. Constitutional Well developed. Well nourished.  Vascular Foot warm and well perfused. Capillary refill normal to all digits.   Neurologic Normal speech. Oriented to person, place, and time. Epicritic sensation to light touch grossly present bilaterally.  Dermatologic  amputation site is open ulceration measuring 2.2 x 1.1 x 0.4 postdebridement he has a granular wound bed there is some green drainage around the wound and the dressing  Orthopedic:  Minimal tenderness to palpation noted about the surgical site.       Assessment:   No diagnosis found.    Plan:  Patient was evaluated and treated and all questions answered.  Ulcer left foot -The wound was debrided of all nonviable tissue to its granular base.  He did have continued maceration and green drainage this week.  I recommended 1 week break from tissue application and that we apply Betadine to the wound periphery with daily change and gentamicin cream to the wound bed to cover for any pseudomonal drainage and colonization.  Follow-up in 1 week for reevaluation   No follow-ups on file.

## 2021-01-07 ENCOUNTER — Ambulatory Visit (INDEPENDENT_AMBULATORY_CARE_PROVIDER_SITE_OTHER): Payer: Medicare Other | Admitting: Podiatry

## 2021-01-07 ENCOUNTER — Other Ambulatory Visit: Payer: Self-pay

## 2021-01-07 DIAGNOSIS — L97522 Non-pressure chronic ulcer of other part of left foot with fat layer exposed: Secondary | ICD-10-CM

## 2021-01-08 NOTE — Progress Notes (Signed)
  Subjective:  Patient ID: Micheal Walls, male    DOB: September 28, 1935,  MRN: [id number]  No chief complaint on file.   DOS: 09/14/2020 Procedure: Left hallux amputation  85 y.o. male returns for post-op check.    Review of Systems: Negative except as noted in the HPI. Denies N/V/F/Ch.   Objective:   There were no vitals filed for this visit.  There is no height or weight on file to calculate BMI. Constitutional Well developed. Well nourished.  Vascular Foot warm and well perfused. Capillary refill normal to all digits.   Neurologic Normal speech. Oriented to person, place, and time. Epicritic sensation to light touch grossly present bilaterally.  Dermatologic  amputation site is open ulceration measuring 2.0 x 1.5 x 0.2 postdebridement (aggressive debridement today to eliminate rolled skin edges) primarily granular wound bed, slight maceration no green drainage today  Orthopedic:  Minimal tenderness to palpation noted about the surgical site.       Assessment:   No diagnosis found.    Plan:  Patient was evaluated and treated and all questions answered.  Ulcer left foot -Wound bed was prepared and debrided of all devitalized tissue.  Aggressive debridement today to reduce the rolled edges of skin -PuraPly XT cross-linked collagen tissue matrix graft was applied d Lot number [id number] expiration 10/26/2021.  10  units used was cut to fit to prevent overlap of edges.  Applied and secured with Mepitel 1, dry sterile gauze dressing Coban and ACE.     Procedure: Excisional Debridement of Wound Rationale: Removal of non-viable soft tissue from the wound to promote healing.  Anesthesia: none  Post-Debridement Wound Measurements: 2.0 x 1.5 x 0.2 Type of Debridement: Sharp Excisional Tissue Removed: Non-viable soft tissue Depth of Debridement: subcutaneous tissue. Technique: Sharp excisional debridement to bleeding, viable wound base.  Dressing: Dry, sterile,  compression dressing. Disposition: Patient tolerated procedure well.      Return in about 1 week (around 01/14/2021) for wound care, Graft application to wound.

## 2021-01-11 NOTE — Chronic Care Management (AMB) (Signed)
Chronic Care Management Pharmacy Assistant   Name: Micheal Walls  MRN: [id number] DOB: 03/06/1936  Reason for Encounter: Medication Review/ Coordination of Enhanced Pharmacy Services.  12/30/2020-  Patient son dropped all medications off to PCP office that patient had at home. Patient had an overabundance of medications on hand.  allopurinol (ZYLOPRIM) 100 MG tablet- Patient had 3 bottles- Fill date 07/15/2020 #180 (73 left); 10/21/2020 #60 (unopened); 11/16/2020 #180 (unopened).  colchicine 0.6 MG tablet- Patient had 3 bottles- Fill date 01/15/2020 #180 (48 left) Expires 01/14/2021; 10/21/2020 #4 (3 Left); 11/16/2020 #12 (12 Left).  diltiazem (CARDIZEM CD) 240 MG 24 hr capsule- Fill date 11/12/2020 #90 (59 left).  furosemide (LASIX) 40 MG tablet- Patient had 2 bottles- Fill date- 10/15/2020 #180 (82 left); 12/10/2020- #180 (unopened).  gabapentin (NEURONTIN) 100 MG capsule- Patient had a total of 5 bottles- Fill date 09/21/2020  #120 (22 left); 12/05/2020 #120; 10/15/2020 #135; 10/15/2020 #135; and 07/15/2020 #135 (132 left).  rosuvastatin (CRESTOR) 10 MG tablet- Patient had one bottle of with fill date of 04/02/2019 #90 (73 Left)  tamsulosin (FLOMAX) 0.4 MG CAPS capsule- Last filled 10/21/2020 #30- 3 Left. Patient also had a new bottle 11/16/2020 #90.  pantoprazole (PROTONIX) 40 MG tablet- Patient had 2 bottles- Fill date 10/15/2020 #30 (45 in bottle); 12/07/2020 #30 (30 left).  Clopidogrel (PLAVIX) 75 mg tablet- Last filled 11/10/2020 #30 (25 Left)  Continuous Blood Gluc Sensor (FREESTYLE LIBRE 14 DAY SENSOR) MISC- Patient had 3 boxes of sensors not used.   dorzolamide (TRUSOPT) 2 % ophthalmic solution- Last filled 07/29/2020  latanoprost (XALATAN) 0.005 % ophthalmic solution- Last filled 10/20/2020  ferrous sulfate 325 (65 FE) MG tablet- (OTC)- 72 on hand. senna-docusate (SENOKOT-S) 8.6-50 MG tablet-(OTC)- 58 on hand. Refresh Eye Drops- (OTC) [place]- (OTC) Areds-(OTC) aspirin EC 81 MG  tablet-(OTC) Biotin 1000 mg-(OTC) Fish oil 1000 mg-(OTC) Centrum Silver Mens MVI-(OTC) Magnesium-(OTC)  Patient's son returned to the office a few hours later to retrieve all medications, he is aware I will discuss with CPP to move forward on a plan on getting patient started with [facility] due to the overabundance and non compliance of taking  medications. Son is in agreement and looking forward to helping his father out on controlling his medications.  *Will discuss with Micheal Walls,CPP on a plan on when to start patient with [facility].   Medications: Outpatient Encounter Medications as of 12/30/2020  Medication Sig Note   allopurinol (ZYLOPRIM) 100 MG tablet Take 100 mg by mouth daily.    aspirin EC 81 MG tablet Take 81 mg by mouth daily. Swallow whole.    colchicine 0.6 MG tablet Take 0.6 mg by mouth once a week.    Continuous Blood Gluc Sensor (FREESTYLE LIBRE 14 DAY SENSOR) MISC Use as directed to check blood sugars    diltiazem (CARDIZEM CD) 240 MG 24 hr capsule Take 1 capsule (240 mg total) by mouth daily.    dorzolamide (TRUSOPT) 2 % ophthalmic solution Place 1 drop into both eyes at bedtime.    ferrous sulfate 325 (65 FE) MG tablet Take 325-650 mg by mouth See admin instructions. 325mg  on Monday Wednesday Friday 650mg  on Sunday Tuesday Thursday saturday 10/21/2020: Taking 2 tablets Monday, Wednesday and Friday and then 1 tablet on Tuesday, Thursday, Saturday and Sunday.    furosemide (LASIX) 40 MG tablet TAKE 2 TABS IN MORNING AND 1 TAB EVENING    gabapentin (NEURONTIN) 100 MG capsule Take 2 capsules in am 1 capsule mid day  and 1 capsule in evening    glucose blood (FREESTYLE PRECISION NEO TEST) test strip Use as instructed    HYDROcodone-acetaminophen (NORCO/VICODIN) 5-325 MG tablet Take 1 tablet by mouth 2 (two) times daily as needed for severe pain. Take one to two tablets every six to eight hours as needed for pain.    latanoprost (XALATAN) 0.005 %  ophthalmic solution Place 1 drop into both eyes at bedtime.     rosuvastatin (CRESTOR) 10 MG tablet Take 1 tablet (10 mg total) by mouth daily.    Semaglutide, [name], (OZEMPIC, [name],) 2 MG/1.5ML SOPN Inject 1 mg into the skin once a week.    senna-docusate (SENOKOT-S) 8.6-50 MG tablet Take 1 tablet by mouth 2 (two) times daily between meals as needed for mild constipation or moderate constipation.    tamsulosin (FLOMAX) 0.4 MG CAPS capsule Take 0.4 mg by mouth daily.    [DISCONTINUED] pantoprazole (PROTONIX) 40 MG tablet TAKE 1 TABLET BY MOUTH EVERY DAY    No facility-administered encounter medications on file as of 12/30/2020.    Care Gaps: Covid vaccine due Shingrix due Yearly foot exam due Annual Wellness Visit scheduled for 05-20-2021  Star Rating Drugs: Ozempic 1 mg- Last filled 11/20/2020 for [facility] Rosuvastatin 10 mg- Last filled 11-12-2020 [facility]  Follow up visit with PCP- Micheal Brine, [name] scheduled for 02/04/2021 at [place], [place] Pharmacist Assistant [phone]

## 2021-01-14 ENCOUNTER — Encounter: Payer: Self-pay | Admitting: Podiatry

## 2021-01-14 ENCOUNTER — Other Ambulatory Visit: Payer: Self-pay

## 2021-01-14 ENCOUNTER — Telehealth: Payer: Self-pay

## 2021-01-14 ENCOUNTER — Ambulatory Visit (INDEPENDENT_AMBULATORY_CARE_PROVIDER_SITE_OTHER): Payer: Medicare Other | Admitting: Podiatry

## 2021-01-14 ENCOUNTER — Telehealth: Payer: Self-pay | Admitting: Cardiovascular Disease

## 2021-01-14 DIAGNOSIS — I739 Peripheral vascular disease, unspecified: Secondary | ICD-10-CM | POA: Diagnosis not present

## 2021-01-14 DIAGNOSIS — L97522 Non-pressure chronic ulcer of other part of left foot with fat layer exposed: Secondary | ICD-10-CM

## 2021-01-14 MED ORDER — PANTOPRAZOLE SODIUM 40 MG PO TBEC: 40.0000 mg | DELAYED_RELEASE_TABLET | Freq: Every day | ORAL | 1 refills | Status: DC

## 2021-01-14 MED ORDER — DILTIAZEM HCL ER COATED BEADS 240 MG PO CP24: 240.0000 mg | ORAL_CAPSULE | Freq: Every day | ORAL | 3 refills | Status: DC

## 2021-01-14 MED ORDER — ROSUVASTATIN CALCIUM 10 MG PO TABS: 10.0000 mg | ORAL_TABLET | Freq: Every day | ORAL | 3 refills | Status: DC

## 2021-01-14 MED ORDER — FUROSEMIDE 40 MG PO TABS: ORAL_TABLET | ORAL | 2 refills | Status: DC

## 2021-01-14 NOTE — Progress Notes (Signed)
  Subjective:  Patient ID: Micheal Walls, male    DOB: 1935-08-05,  MRN: [id number]  Chief Complaint  Patient presents with   Foot Ulcer    Graft application to wound per [place]     DOS: 09/14/2020 Procedure: Left hallux amputation  85 y.o. male returns for post-op check.    Review of Systems: Negative except as noted in the HPI. Denies N/V/F/Ch.   Objective:   There were no vitals filed for this visit.  There is no height or weight on file to calculate BMI. Constitutional Well developed. Well nourished.  Vascular Foot warm and well perfused. Capillary refill normal to all digits.   Neurologic Normal speech. Oriented to person, place, and time. Epicritic sensation to light touch grossly present bilaterally.  Dermatologic  amputation site is open ulceration measuring 2.1 x 1.6 x 0.2 postdebridement (aggressive debridement today to eliminate rolled skin edges) primarily granular wound bed, slight maceration no green drainage today  Orthopedic:  Minimal tenderness to palpation noted about the surgical site.        Assessment:   1. Ulcer of left foot with fat layer exposed ([place])   2. PAD (peripheral artery disease) ([place])       Plan:  Patient was evaluated and treated and all questions answered.  Ulcer left foot -Wound bed was prepared and debrided of all devitalized tissue.  Aggressive debridement today to reduce the rolled edges of skin -PuraPly XT cross-linked collagen tissue matrix graft was applied today to promote granulation tissue, and have bioburden and support wound healing.  Lot number [id number] expiration 10/26/2021.  10 square centimeters used was cut to fit to prevent overlap of edges and prevent maceration, 15 cm wasted today.  Applied and secured with Mepitel 1, dry sterile gauze dressing Coban and ACE. -Continue offloading with surgical shoe -Has been approximately 5 months since his last vascular procedure.  I am ordering new ABIs to evaluate  his lower extremity arterial flow to ensure that it is still adequate for wound healing   Procedure: Excisional Debridement of Wound Rationale: Removal of non-viable soft tissue from the wound to promote healing.  Anesthesia: none  Post-Debridement Wound Measurements: 2.1x1.6 from 0.3 Type of Debridement: Sharp Excisional Tissue Removed: Non-viable soft tissue Depth of Debridement: subcutaneous tissue. Technique: Sharp excisional debridement to bleeding, viable wound base.  Dressing: Dry, sterile, compression dressing. Disposition: Patient tolerated procedure well.      Return in about 1 week (around 01/21/2021) for wound care, Graft application to wound.

## 2021-01-14 NOTE — Chronic Care Management (AMB) (Signed)
Chronic Care Management Pharmacy Assistant   Name: Micheal Walls  MRN: [id number] DOB: August 22, 1935  Reason for Encounter: Medication Coordination for Enhanced Pharmacy Services   01/14/2021- Form updated for patient to start adherence packaging to [facility] on 01/21/2021. Called Cardiology office- Dr. Conley Walls to request refills of Rosuvastatin 10 mg and Diltiazem 240 mg, spoke with front staff who sent request to clinical staff for refills. Also called Dr Micheal Walls office- Opthamalogy requesting refills of Xalantan 0.005% and Trusopt 2% eye drops left message on clinical staff voicemail for refills.  Received a call back from Dr Micheal Walls office, he stated he never prescribed these medications for this patient. Will follow up with patient.  Received a call back from Cardiology office, spoke with Micheal Walls, she is sending refills to [facility].  01/18/2021- Spoke with daughter regarding medication delivery of packaging system with [facility]. Daughter states she was heading in town tomorrow to check in on patient due to him not feeling well and he stated it was from the Plavix and that he discontinued med because it was giving him chills. Daughter didn't think that was the issue and told patient to restart Plavix. Inquired from daughter if he checked his blood sugars to see what the levels were, he could have had some hyperglycemic symptoms especially if his diet is off. Daughter stated he did not know what his blood sugars was but did admits to not eating right and eating out more. She was going to go through his medications as soon as she arrives and was very excited for [facility] to start packing his medications because she knows he is not taking them correctly. Daughter aware I will follow up with her tomorrow on medication and delivery.  01/19/2021- Received notification from [facility] that patient's medications were going to cost him $5000 for a 90 day supply and wanted to  know if there was an updated prescription card. The most expensive medications were the Ozempic and the [employer] Patient currently has Medicare primary and [employer] secondary, verified in chart and verified with patients daughter. Daughter mentioned that he could get the Ozempic and [employer] through [employer] coverage with [employer] free of charge but would like the rest of his medications to come from [facility]. Daughter would also like Micheal Walls to start with [facility] and also requested some medication refills for her. Documented in Micheal Walls chart. Daughter aware I will follow up with Pharmacy to see total cost of medication due to insurance coverage minus the Ozempic and Freestyle Sensors and call her back. Inquired if patient was using the sensors properly now due to in the past having problem keeping sensors on due to fall and being off balance. Daughter stated he is using them properly now but she is still trying to figure out how to get the online app to download his readings. Daughter aware I will email her the Micheal Walls patient education material to help with this. Also made daughter aware that patient's Plavix was discontinued by Cardiologist so he should not be taking.  Daughter also wanted to know if he could possibly get delivery of medication this week while she was there, delivery scheduled for 02/02/2021 but would like to go through the process with the patient before she leaves to go back to [place] and [facility]. Patient gets confused with all of his medications and she wants to alleviate any more confusion why removing  all vials. Daughter is even considering placing patient and wife in a nursing facility for their care. She has gotten them set up for Meals on Wheels but this is only for one meal a day.  Daughter aware I will check medications prices and return call.  Spoke with [facility] on medication  request updates and with removing the Ozempic and Freestlye sensors his total cost for a 90 day supply would be $118.   01/21/2021- Called patient daughter back Micheal Walls, she is aware of the total price for a 90 day supply, we would have liked to do a 30 day supply for adherence but for cost purposes 90 day would be better. Discussed with daughter on having a plan to have Micheal Walls son hold on to 2 months of packaging system so patient would not be confused on which one he has opened. Daughter also aware that I did not get a good response from Opthamologist- Dr Micheal Walls on his eye drops. Daughter states that he uses otc drops more and has a follow up visit with Dr. Pandora Walls- Opthamology next week and if any medications needed they will have him send to [facility].  While on the phone with daughter I was able to get a response back from Pharmacy on moving delivery up to 01/22/2021 while daughter is in town. Daughter aware and very appreciative of all the help and excited to get patient started with [facility]. Micheal Walls, CPP notified.   BP Readings from Last 3 Encounters:  12/01/20 105/82  11/12/20 (!) 96/52  10/29/20 110/62    Lab Results  Component Value Date   HGBA1C 6.3 (H) 10/29/2020     Patient obtains medications through Adherence Packaging  90 Days  New onboard patient to [employer].  This delivery to include: Gabapentin 100 mg- 2 tablets tid (breakfast, lunch, evening meal) Allopurinol 100 mg- 2 tablets daily (breakfast) Flomax  0.4 mg- 1 tablet daily (breakfast)   Colcrys 0.6 mg- 1 tablet once weekly (breakfast) Diltiazem CD 240 mg- 1 tablet daily (breakfast) Rosuvastatin 10 mg- 1 tablet once on Monday (bedtime) Furosemide 40 mg- 2 tablets in the morning and 1 tablet in the evening (breakfast, evening meal) Pantoprazole 40 mg- 1 tablet daily (bedtime)  Confirmed delivery date of 01/22/2021, advised patient that pharmacy will contact them the morning of  delivery.  Holding on vitamins, patient will continue with OTC meds- Asa 81 mg, Multivitamin and Ferrous Sulfate 325 mg.  Medications: Outpatient Encounter Medications as of 01/14/2021  Medication Sig Note   allopurinol (ZYLOPRIM) 100 MG tablet Take 100 mg by mouth daily.    aspirin EC 81 MG tablet Take 81 mg by mouth daily. Swallow whole.    colchicine 0.6 MG tablet Take 0.6 mg by mouth once a week.    Continuous Blood Gluc Sensor (FREESTYLE LIBRE 14 DAY SENSOR) MISC Use as directed to check blood sugars    diltiazem (CARDIZEM CD) 240 MG 24 hr capsule Take 1 capsule (240 mg total) by mouth daily.    dorzolamide (TRUSOPT) 2 % ophthalmic solution Place 1 drop into both eyes at bedtime.    ferrous sulfate 325 (65 FE) MG tablet Take 325-650 mg by mouth See admin instructions. 325mg  on Monday Wednesday Friday 650mg  on Sunday Tuesday Thursday saturday 10/21/2020: Taking 2 tablets Monday, Wednesday and Friday and then 1 tablet on Tuesday, Thursday, Saturday and Sunday.    furosemide (LASIX) 40 MG tablet Take 2 tablets in the morning and one in  the evening    gabapentin (NEURONTIN) 100 MG capsule Take 2 capsules in am 1 capsule mid day and 1 capsule in evening    gentamicin cream (GARAMYCIN) 0.1 % Apply 1 application topically daily. To foot wound with dressing changes    glucose blood (FREESTYLE PRECISION NEO TEST) test strip Use as instructed    HYDROcodone-acetaminophen (NORCO/VICODIN) 5-325 MG tablet Take 1 tablet by mouth 2 (two) times daily as needed for severe pain. Take one to two tablets every six to eight hours as needed for pain.    latanoprost (XALATAN) 0.005 % ophthalmic solution Place 1 drop into both eyes at bedtime.     pantoprazole (PROTONIX) 40 MG tablet Take 1 tablet (40 mg total) by mouth daily.    rosuvastatin (CRESTOR) 10 MG tablet Take 1 tablet (10 mg total) by mouth daily.    Semaglutide, [name], (OZEMPIC, [name],) 2 MG/1.5ML SOPN Inject 1 mg into the skin once a week.     senna-docusate (SENOKOT-S) 8.6-50 MG tablet Take 1 tablet by mouth 2 (two) times daily between meals as needed for mild constipation or moderate constipation.    tamsulosin (FLOMAX) 0.4 MG CAPS capsule Take 0.4 mg by mouth daily.    No facility-administered encounter medications on file as of 01/14/2021.    Care Gaps: Covid vaccine due Shingrix due Yearly foot exam due Annual Wellness Visit scheduled for 05-20-2021  Star Rating Drugs: Ozempic 1 mg- Last filled 11/20/2020 for [facility] Rosuvastatin 10 mg- Last filled 11-12-2020 [facility]   Follow up visit with PCP- [name], [name] scheduled for 02/04/2021 at [place], [place] Pharmacist Assistant [phone]

## 2021-01-14 NOTE — Telephone Encounter (Signed)
*  STAT* If patient is at the pharmacy, call can be transferred to refill team.   1. Which medications need to be refilled? (please list name of each medication and dose if known) rosuvastatin (CRESTOR) 10 MG tablet diltiazem (CARDIZEM CD) 240 MG 24 hr capsule  2. Which pharmacy/location (including street and city if local pharmacy) is medication to be sent to? [facility] - [place], [place] - [place] [address]   3. Do they need a 30 day or 90 day supply? 90ds

## 2021-01-14 NOTE — Telephone Encounter (Signed)
Sent as requested, Tamela aware

## 2021-01-18 ENCOUNTER — Other Ambulatory Visit: Payer: Self-pay

## 2021-01-18 ENCOUNTER — Ambulatory Visit (HOSPITAL_COMMUNITY)
Admission: RE | Admit: 2021-01-18 | Discharge: 2021-01-18 | Disposition: A | Discharge status: Home / Self Care | Payer: Medicare Other | Source: Ambulatory Visit | Attending: Podiatry | Admitting: Podiatry

## 2021-01-18 DIAGNOSIS — I739 Peripheral vascular disease, unspecified: Secondary | ICD-10-CM | POA: Insufficient documentation

## 2021-01-21 ENCOUNTER — Encounter: Payer: Self-pay | Admitting: Podiatry

## 2021-01-21 ENCOUNTER — Other Ambulatory Visit: Payer: Self-pay

## 2021-01-21 ENCOUNTER — Ambulatory Visit (INDEPENDENT_AMBULATORY_CARE_PROVIDER_SITE_OTHER): Payer: Medicare Other | Admitting: Podiatry

## 2021-01-21 DIAGNOSIS — Z794 Long term (current) use of insulin: Secondary | ICD-10-CM

## 2021-01-21 DIAGNOSIS — I739 Peripheral vascular disease, unspecified: Secondary | ICD-10-CM | POA: Diagnosis not present

## 2021-01-21 DIAGNOSIS — L97522 Non-pressure chronic ulcer of other part of left foot with fat layer exposed: Secondary | ICD-10-CM | POA: Diagnosis not present

## 2021-01-21 DIAGNOSIS — E1159 Type 2 diabetes mellitus with other circulatory complications: Secondary | ICD-10-CM

## 2021-01-21 MED ORDER — OZEMPIC (1 MG/DOSE) 2 MG/1.5ML ~~LOC~~ SOPN: 1.0000 mg | PEN_INJECTOR | SUBCUTANEOUS | 1 refills | Status: DC

## 2021-01-21 MED ORDER — FREESTYLE LIBRE 14 DAY SENSOR MISC: 1 refills | Status: DC

## 2021-01-22 DIAGNOSIS — H401132 Primary open-angle glaucoma, bilateral, moderate stage: Secondary | ICD-10-CM | POA: Diagnosis not present

## 2021-01-22 DIAGNOSIS — H35372 Puckering of macula, left eye: Secondary | ICD-10-CM | POA: Diagnosis not present

## 2021-01-22 DIAGNOSIS — E103293 Type 1 diabetes mellitus with mild nonproliferative diabetic retinopathy without macular edema, bilateral: Secondary | ICD-10-CM | POA: Diagnosis not present

## 2021-01-22 DIAGNOSIS — H353132 Nonexudative age-related macular degeneration, bilateral, intermediate dry stage: Secondary | ICD-10-CM | POA: Diagnosis not present

## 2021-01-25 ENCOUNTER — Encounter: Payer: Self-pay | Admitting: Podiatry

## 2021-01-25 NOTE — Progress Notes (Signed)
  Subjective:  Patient ID: Micheal Walls, male    DOB: 1935/11/02,  MRN: [id number]  Chief Complaint  Patient presents with   Foot Ulcer       Graft application to wound per Elianah Karis     DOS: 09/14/2020 Procedure: Left hallux amputation  85 y.o. male returns for post-op check.    Review of Systems: Negative except as noted in the HPI. Denies N/V/F/Ch.   Objective:   There were no vitals filed for this visit.  There is no height or weight on file to calculate BMI. Constitutional Well developed. Well nourished.  Vascular Foot warm and well perfused. Capillary refill normal to all digits.   Neurologic Normal speech. Oriented to person, place, and time. Epicritic sensation to light touch grossly present bilaterally.  Dermatologic  amputation site is open ulceration measuring 2.1 x 1.6 x 0.2 postdebridement minimal change in wound dimensions or quality  Orthopedic:  Minimal tenderness to palpation noted about the surgical site.     ABI 0.7 Assessment:   1. PAD (peripheral artery disease) ([place])   2. Ulcer of left foot with fat layer exposed ([place])       Plan:  Patient was evaluated and treated and all questions answered.  Ulcer left foot -I evaluated his wound today and did debride the overlying devitalized tissue.  I discussed with him that unfortunately it seems that his arterial flow has decreased with a decrease in his ABI since his last testing.  I am referring him back to Dr. Trula Slade for further consideration if he may need angiography.  We will off on any further advanced tissue applications until that has been addressed.  We discussed the risk of further limb loss which we are hopeful to avoid in a patient his age and functional level -Continue offloading with surgical shoe -Continue local wound care at home with gentamicin cream with daily or every other day changes   Return in 12 days (on 02/02/2021).

## 2021-01-28 ENCOUNTER — Ambulatory Visit: Payer: Medicare Other | Admitting: Podiatry

## 2021-01-29 MED ORDER — GENTAMICIN SULFATE 0.1 % EX CREA: 1.0000 "application " | TOPICAL_CREAM | Freq: Every day | CUTANEOUS | 1 refills | Status: DC

## 2021-02-02 ENCOUNTER — Ambulatory Visit (INDEPENDENT_AMBULATORY_CARE_PROVIDER_SITE_OTHER): Payer: Medicare Other | Admitting: Podiatry

## 2021-02-02 ENCOUNTER — Other Ambulatory Visit: Payer: Self-pay

## 2021-02-02 DIAGNOSIS — I739 Peripheral vascular disease, unspecified: Secondary | ICD-10-CM

## 2021-02-02 DIAGNOSIS — L97522 Non-pressure chronic ulcer of other part of left foot with fat layer exposed: Secondary | ICD-10-CM | POA: Diagnosis not present

## 2021-02-02 DIAGNOSIS — E1151 Type 2 diabetes mellitus with diabetic peripheral angiopathy without gangrene: Secondary | ICD-10-CM

## 2021-02-04 ENCOUNTER — Encounter: Payer: Self-pay | Admitting: Nurse Practitioner

## 2021-02-04 ENCOUNTER — Other Ambulatory Visit: Payer: Self-pay

## 2021-02-04 ENCOUNTER — Ambulatory Visit (INDEPENDENT_AMBULATORY_CARE_PROVIDER_SITE_OTHER): Payer: Medicare Other | Admitting: Nurse Practitioner

## 2021-02-04 VITALS — BP 136/70 | HR 85 | Temp 98.3°F | Ht 73.0 in

## 2021-02-04 DIAGNOSIS — E782 Mixed hyperlipidemia: Secondary | ICD-10-CM | POA: Diagnosis not present

## 2021-02-04 DIAGNOSIS — E1159 Type 2 diabetes mellitus with other circulatory complications: Secondary | ICD-10-CM

## 2021-02-04 DIAGNOSIS — I6330 Cerebral infarction due to thrombosis of unspecified cerebral artery: Secondary | ICD-10-CM | POA: Diagnosis not present

## 2021-02-04 DIAGNOSIS — I739 Peripheral vascular disease, unspecified: Secondary | ICD-10-CM | POA: Diagnosis not present

## 2021-02-04 DIAGNOSIS — I10 Essential (primary) hypertension: Secondary | ICD-10-CM

## 2021-02-04 DIAGNOSIS — Z794 Long term (current) use of insulin: Secondary | ICD-10-CM | POA: Diagnosis not present

## 2021-02-04 DIAGNOSIS — R6889 Other general symptoms and signs: Secondary | ICD-10-CM

## 2021-02-04 NOTE — Progress Notes (Signed)
I,Katawbba Wiggins,acting as a Education administrator for [employer], [name].,have documented all relevant documentation on the behalf of [name], [name],as directed by  [name], [name] while in the presence of [name], [place].  This visit occurred during the SARS-CoV-2 public health emergency.  Safety protocols were in place, including screening questions prior to the visit, additional usage of staff PPE, and extensive cleaning of exam room while observing appropriate contact time as indicated for disinfecting solutions.  Subjective:     Patient ID: Micheal Walls , male    DOB: [birth year] , 85 y.o.   MRN: [id number]   Chief Complaint  Patient presents with   Diabetes   Hypertension    HPI  Patient here for a f/u on his diabetes and blood pressure.  He stopped taking his plavix due to being cold, he would get chill bumps, he has the heat on 80 degrees - after he takes for a couple days he has these symptoms.   Diabetes He presents for his follow-up diabetic visit. He has type 2 diabetes mellitus. His disease course has been improving. Pertinent negatives for hypoglycemia include no confusion, dizziness, headaches or nervousness/anxiousness. There are no diabetic associated symptoms. Pertinent negatives for diabetes include no blurred vision, no fatigue, no polydipsia, no polyphagia and no polyuria. There are no hypoglycemic complications. Symptoms are improving. There are no diabetic complications. Risk factors for coronary artery disease include sedentary lifestyle and obesity. Current diabetic treatment includes oral agent (monotherapy) (no longer on Tresiba since December). He is compliant with treatment all of the time. His weight is stable. He is following a diabetic diet. When asked about meal planning, he reported none. He has not had a previous visit with a dietitian. He rarely participates in exercise. His home blood glucose trend is decreasing steadily. (Blood sugar was 103 this morning.  After  review of his meter ranges 92-141.  ) An ACE inhibitor/angiotensin II receptor blocker is being taken. He sees a podiatrist (Dr Adah Perl).Eye exam is current (Dr. Radene Ou - 01/01/2020).  Hypertension This is a chronic problem. The current episode started more than 1 year ago. The problem is unchanged. The problem is uncontrolled. Associated symptoms include peripheral edema (left lower extremity). Pertinent negatives include no anxiety, blurred vision or headaches. There are no associated agents to hypertension. Risk factors for coronary artery disease include obesity and sedentary lifestyle. Past treatments include ACE inhibitors. There are no compliance problems.  Hypertensive end-organ damage includes kidney disease. There is no history of angina. Identifiable causes of hypertension include chronic renal disease.    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 Swallow whole., Disp: , Rfl:    colchicine 0.6 MG tablet, Take 0.6  mg by mouth once a week., Disp: , Rfl:    Continuous Blood Gluc Sensor (FREESTYLE LIBRE 14 DAY SENSOR) MISC, Use as directed to check blood sugars, Disp: 6 each, Rfl: 1   diltiazem (CARDIZEM CD) 240 MG 24 hr capsule, Take 1 capsule (240 mg total) by mouth daily., Disp: 90 capsule, Rfl: 3   dorzolamide (TRUSOPT) 2 % ophthalmic solution, Place 1 drop into both eyes at bedtime., Disp: ,  Rfl:    ferrous sulfate 325 (65 FE) MG tablet, Take 325-650 mg by mouth See admin instructions. 1 tab 2 times per day, Disp: , Rfl:    furosemide (LASIX) 40 MG tablet, Take 2 tablets in the morning and one in the evening, Disp: 180 tablet, Rfl: 2   gabapentin (NEURONTIN) 100 MG capsule, Take 2 capsules in am 1 capsule mid day and 1 capsule in evening, Disp: 270 capsule, Rfl: 0   glucose blood (FREESTYLE PRECISION NEO TEST) test strip, Use as instructed, Disp: 100 each, Rfl: 12   HYDROcodone-acetaminophen (NORCO/VICODIN) 5-325 MG tablet, Take 1 tablet by mouth 2 (two) times daily as needed for severe pain. Take one to two tablets every six to eight hours as needed for pain., Disp: 10 tablet, Rfl: 0   latanoprost (XALATAN) 0.005 % ophthalmic solution, Place 1 drop into both eyes at bedtime. , Disp: , Rfl:    pantoprazole (PROTONIX) 40 MG tablet, Take 1 tablet (40 mg total) by mouth daily., Disp: 90 tablet, Rfl: 1   rosuvastatin (CRESTOR) 10 MG tablet, Take 1 tablet (10 mg total) by mouth daily., Disp: 90 tablet, Rfl: 3   Semaglutide, [name], (OZEMPIC, [name],) 2 MG/1.5ML SOPN, Inject 1 mg into the skin once a week., Disp: 9 mL, Rfl: 1   senna-docusate (SENOKOT-S) 8.6-50 MG tablet, Take 1 tablet by mouth 2 (two) times daily between meals as needed for mild constipation or moderate constipation., Disp: 60 tablet, Rfl: 0   tamsulosin (FLOMAX) 0.4 MG CAPS capsule, Take 0.4 mg by mouth daily., Disp: , Rfl:    gentamicin cream (GARAMYCIN) 0.1 %, Apply 1 application topically daily. To foot wound with dressing changes, Disp: 30 g, Rfl: 1   Allergies  Allergen Reactions   Vancomycin Rash     Review of Systems  Constitutional: Negative.  Negative for fatigue.  Eyes:  Negative for blurred vision.  Respiratory: Negative.    Cardiovascular: Negative.   Gastrointestinal: Negative.   Endocrine: Negative for polydipsia, polyphagia and polyuria.  Neurological:  Negative for dizziness and headaches.   Psychiatric/Behavioral: Negative.  Negative for confusion. The patient is not nervous/anxious.   All other systems reviewed and are negative.   Today's Vitals   02/04/21 0902  BP: 136/70  Pulse: 85  Temp: 98.3 F (36.8 C)  TempSrc: Oral  Height: _0  (1.854 m)  PainSc: 5   PainLoc: Foot   Body mass index is 33.59 kg/m.  Wt Readings from Last 3 Encounters:  02/18/21 254 lb (115.2 kg)  11/12/20 254 lb 9.6 oz (115.5 kg)  10/21/20 270 lb (122.5 kg)    BP Readings from Last 3 Encounters:  02/18/21 (!) 144/71  02/04/21 136/70  12/01/20 105/82    Objective:  Physical Exam Vitals reviewed.  Constitutional:      General: He is not in acute distress.    Appearance: Normal appearance.  HENT:     Ears:     Comments: Hearing aid is present to right ear. 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     Pulses: Normal pulses.     Heart sounds: Normal heart sounds. No murmur heard. Pulmonary:     Effort: Pulmonary effort is normal. No respiratory distress.     Breath sounds: Normal breath sounds. No wheezing.  Skin:    Capillary Refill: Capillary refill takes less than 2 seconds.     Comments: He has a dressing to his left foot that is being managed by the wound center  Neurological:     General: No focal deficit present.     Mental Status: He is alert and oriented to person, place, and time.     Cranial Nerves: No cranial nerve deficit.  Psychiatric:        Mood and Affect: Mood normal.        Behavior: Behavior normal.        Thought Content: Thought content normal.        Judgment: Judgment normal.        Assessment And Plan:     1. Type 2 diabetes mellitus with other circulatory complication, with long-term current use of insulin (HCC) Comments: Stable, continue current medications This is likely affecting his ability to heal with his wound to his left amptated toe - BMP8+EGFR - Hemoglobin A1c  2.  Essential hypertension Comments: Fair control Continue current medications - BMP8+EGFR  3. PAD (peripheral artery disease) ([place]) Comments: Continue follow up with Podiatry and Vascular  4. Mixed hyperlipidemia Comments: Stable, continue current medications. Tolerating medications well  5. Cerebral thrombosis with cerebral infarction Comments: He has not followed up with Neurology since being diagnosed in February 2022 - Ambulatory referral to Neurology  6. Sensation of feeling cold Comments: He has stopped taking the clopidrogel due to feeling cold all the time I have strongly encouraged him to continue - Iron, TIBC and Ferritin Panel - TSH   No longer taking plavix due to him feeling cold.    Patient was given opportunity to ask questions. Patient verbalized understanding of the plan and was able to repeat key elements of the plan. All questions were answered to their satisfaction.  [name], [name]   I, [name], [name], have reviewed all documentation for this visit. The documentation on 02/19/21 for the exam, diagnosis, procedures, and orders are all accurate and complete.   IF YOU HAVE BEEN REFERRED TO A SPECIALIST, IT MAY TAKE 1-2 WEEKS TO SCHEDULE/PROCESS THE REFERRAL. IF YOU HAVE NOT HEARD FROM US/SPECIALIST IN TWO WEEKS, PLEASE GIVE [place] A CALL AT [phone].   THE PATIENT IS ENCOURAGED TO PRACTICE SOCIAL DISTANCING DUE TO THE COVID-19 PANDEMIC.

## 2021-02-04 NOTE — Progress Notes (Signed)
  Subjective:  Patient ID: Micheal Walls, male    DOB: 04-20-1936,  MRN: [id number]  Chief Complaint  Patient presents with   Foot Ulcer    Left foot wound care     DOS: 09/14/2020 Procedure: Left hallux amputation  85 y.o. male returns for post-op check.  He has an appointment scheduled with Dr. Scot Dock upcoming in September  Review of Systems: Negative except as noted in the HPI. Denies N/V/F/Ch.   Objective:   There were no vitals filed for this visit.  There is no height or weight on file to calculate BMI. Constitutional Well developed. Well nourished.  Vascular Foot warm and well perfused. Capillary refill normal to all digits.   Neurologic Normal speech. Oriented to person, place, and time. Epicritic sensation to light touch grossly present bilaterally.  Dermatologic  amputation site is open ulceration measuring 2.1 x 1.6 x 0.2 postdebridement minimal change in wound dimensions or quality  Orthopedic:  Minimal tenderness to palpation noted about the surgical site.      ABI 0.7 Assessment:   1. Ulcer of left foot with fat layer exposed ([place])   2. PAD (peripheral artery disease) ([place])   3. Type II diabetes mellitus with peripheral circulatory disorder [facility])        Plan:  Patient was evaluated and treated and all questions answered.  Ulcer left foot -Wound debrided as below to remove devitalized and nonviable tissue.  We will hold off on any further tissue applications until vascular eval.  I think if his flow is good may need to debride as an outpatient and apply an Integra bilayer to form a better granular base -Continue offloading with surgical shoe -Continue local wound care at home with gentamicin cream with daily or every other day changes  Procedure: Excisional Debridement of Wound Rationale: Removal of non-viable soft tissue from the wound to promote healing.  Anesthesia: none Post-Debridement Wound Measurements: 2.1 cm x 1.6 cm x 0.2 cm  Type of  Debridement: Sharp Excisional Tissue Removed: Non-viable soft tissue Depth of Debridement: subcutaneous tissue. Technique: Sharp excisional debridement to bleeding, viable wound base.  Dressing: Dry, sterile, compression dressing. Disposition: Patient tolerated procedure well.     Return in about 4 weeks (around 03/02/2021) for wound care.

## 2021-02-05 ENCOUNTER — Ambulatory Visit: Payer: Medicare Other | Admitting: Podiatry

## 2021-02-05 LAB — IRON,TIBC AND FERRITIN PANEL
Ferritin: 178 ng/mL (ref 30–400)
Iron Saturation: 28 % (ref 15–55)
Iron: 68 ug/dL (ref 38–169)
Total Iron Binding Capacity: 246 ug/dL — ABNORMAL LOW (ref 250–450)
UIBC: 178 ug/dL (ref 111–343)

## 2021-02-05 LAB — HEMOGLOBIN A1C
Est. average glucose Bld gHb Est-mCnc: 140 mg/dL
Hgb A1c MFr Bld: 6.5 % — ABNORMAL HIGH (ref 4.8–5.6)

## 2021-02-05 LAB — BMP8+EGFR
BUN/Creatinine Ratio: 24 (ref 10–24)
BUN: 37 mg/dL — ABNORMAL HIGH (ref 8–27)
CO2: 19 mmol/L — ABNORMAL LOW (ref 20–29)
Calcium: 10.2 mg/dL (ref 8.6–10.2)
Chloride: 107 mmol/L — ABNORMAL HIGH (ref 96–106)
Creatinine, Ser: 1.53 mg/dL — ABNORMAL HIGH (ref 0.76–1.27)
Glucose: 95 mg/dL (ref 65–99)
Potassium: 4.5 mmol/L (ref 3.5–5.2)
Sodium: 141 mmol/L (ref 134–144)
eGFR: 45 mL/min/{1.73_m2} — ABNORMAL LOW (ref >59)

## 2021-02-05 LAB — TSH: TSH: 5.13 u[IU]/mL — ABNORMAL HIGH (ref 0.450–4.500)

## 2021-02-08 ENCOUNTER — Telehealth: Payer: Self-pay

## 2021-02-08 ENCOUNTER — Telehealth: Payer: Medicare Other

## 2021-02-08 NOTE — Telephone Encounter (Signed)
  Care Management   Follow Up Note   02/08/2021 Name: Micheal Walls MRN: [id number] DOB: 26-Sep-1935   Referred by: [name], [name] Reason for referral : Chronic Care Management (RN CM Follow up call )   An unsuccessful telephone outreach was attempted today. The patient was referred to the case management team for assistance with care management and care coordination.   Follow Up Plan: A HIPPA compliant phone message was left for the patient providing contact information and requesting a return call.   Barb Merino, RN, BSN, CCM Care Management Coordinator [facility]  Direct Phone: [phone]

## 2021-02-11 ENCOUNTER — Ambulatory Visit (INDEPENDENT_AMBULATORY_CARE_PROVIDER_SITE_OTHER): Payer: Medicare Other

## 2021-02-11 ENCOUNTER — Other Ambulatory Visit: Payer: Self-pay

## 2021-02-11 DIAGNOSIS — E1159 Type 2 diabetes mellitus with other circulatory complications: Secondary | ICD-10-CM

## 2021-02-11 DIAGNOSIS — Z794 Long term (current) use of insulin: Secondary | ICD-10-CM | POA: Diagnosis not present

## 2021-02-11 DIAGNOSIS — I10 Essential (primary) hypertension: Secondary | ICD-10-CM

## 2021-02-11 DIAGNOSIS — L97909 Non-pressure chronic ulcer of unspecified part of unspecified lower leg with unspecified severity: Secondary | ICD-10-CM

## 2021-02-11 DIAGNOSIS — I739 Peripheral vascular disease, unspecified: Secondary | ICD-10-CM

## 2021-02-11 NOTE — Patient Instructions (Signed)
Social Worker Visit Information  Goals we discussed today:   Goals Addressed             This Visit's Progress    Mobility and Independence Optimized   On track    Timeframe:  Long-Range Goal Priority:  High Start Date:  3.14.22                                         Next planned outreach: 9.15.22  Patient Goals/Self-Care Activities  patient will: With the help of his children  - Attend upcomming appointment to assess wound Contact SW as needed prior to next scheduled call          Follow Up Plan: SW will follow up with patient by phone over the next month   [name], [name], [name], Certified Dementia Practitioner [place] [facility] [place] [facility] [phone]

## 2021-02-11 NOTE — Chronic Care Management (AMB) (Signed)
Chronic Care Management    Social Work Note  02/11/2021 Name: Micheal Walls MRN: [id number] DOB: [birth year]  Micheal Walls is a 84 y.o. year old male who is a primary care patient of [name], [place]. The CCM team was consulted to assist the patient with chronic disease management and/or care coordination needs related to:  PAD and DM II .   Engaged with patients daughter by phone  for follow up visit in response to provider referral for social work chronic care management and care coordination services.   Consent to Services:  The patient was given information about Chronic Care Management services, agreed to services, and gave verbal consent prior to initiation of services.  Please see initial visit note for detailed documentation.   Patient agreed to services and consent obtained.   Assessment: Review of patient past medical history, allergies, medications, and health status, including review of relevant consultants reports was performed today as part of a comprehensive evaluation and provision of chronic care management and care coordination services.     SDOH (Social Determinants of Health) assessments and interventions performed:    Advanced Directives Status: Not addressed in this encounter.  CCM Care Plan  Allergies  Allergen Reactions   Vancomycin Rash    Outpatient Encounter Medications as of 02/11/2021  Medication Sig Note   allopurinol (ZYLOPRIM) 100 MG tablet Take 100 mg by mouth daily.    aspirin EC 81 MG tablet Take 81 mg by mouth daily. Swallow whole.    colchicine 0.6 MG tablet Take 0.6 mg by mouth once a week.    Continuous Blood Gluc Sensor (FREESTYLE LIBRE 14 DAY SENSOR) MISC Use as directed to check blood sugars    diltiazem (CARDIZEM CD) 240 MG 24 hr capsule Take 1 capsule (240 mg total) by mouth daily.    dorzolamide (TRUSOPT) 2 % ophthalmic solution Place 1 drop into both eyes at bedtime.    ferrous sulfate 325 (65 FE) MG tablet Take 325-650 mg by  mouth See admin instructions. 1 tab 2 times per day 10/21/2020: Taking 2 tablets Monday, Wednesday and Friday and then 1 tablet on Tuesday, Thursday, Saturday and Sunday.    furosemide (LASIX) 40 MG tablet Take 2 tablets in the morning and one in the evening    gabapentin (NEURONTIN) 100 MG capsule Take 2 capsules in am 1 capsule mid day and 1 capsule in evening    gentamicin cream (GARAMYCIN) 0.1 % Apply 1 application topically daily. To foot wound with dressing changes (Patient not taking: Reported on 02/04/2021)    glucose blood (FREESTYLE PRECISION NEO TEST) test strip Use as instructed    HYDROcodone-acetaminophen (NORCO/VICODIN) 5-325 MG tablet Take 1 tablet by mouth 2 (two) times daily as needed for severe pain. Take one to two tablets every six to eight hours as needed for pain.    latanoprost (XALATAN) 0.005 % ophthalmic solution Place 1 drop into both eyes at bedtime.     pantoprazole (PROTONIX) 40 MG tablet Take 1 tablet (40 mg total) by mouth daily.    rosuvastatin (CRESTOR) 10 MG tablet Take 1 tablet (10 mg total) by mouth daily.    Semaglutide, [name], (OZEMPIC, [name],) 2 MG/1.5ML SOPN Inject 1 mg into the skin once a week.    senna-docusate (SENOKOT-S) 8.6-50 MG tablet Take 1 tablet by mouth 2 (two) times daily between meals as needed for mild constipation or moderate constipation.    tamsulosin (FLOMAX) 0.4 MG CAPS capsule Take 0.4 mg  by mouth daily.    No facility-administered encounter medications on file as of 02/11/2021.    Patient Active Problem List   Diagnosis Date Noted   Infection of amputation stump of left lower extremity ([place]) 10/21/2020   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[place])    Left ankle swelling    Diabetic foot infection ([place])    Cerebral thrombosis with cerebral infarction 07/20/2020   Thrombocytopenia ([place]) 07/19/2020   Cerebral ischemia 07/18/2020   Bilateral hearing loss 05/20/2020   Does use hearing aid 05/20/2020   Ulcer of left foot with fat layer  exposed ([place]) 01/16/2020   Controlled type 2 diabetes mellitus with stable proliferative retinopathy of both eyes, with long-term current use of insulin ([place]) 01/06/2020   Intermediate stage nonexudative age-related macular degeneration of both eyes 01/06/2020   Left epiretinal membrane 01/06/2020   [place] of right macula 01/06/2020   AKI (acute kidney injury) ([place]) 11/02/2019   Cellulitis 11/02/2019   Elevated TSH 07/10/2018   Nephropathy 02/28/2018   Disturbance of skin sensation 02/28/2018   PAD (peripheral artery disease) ([place]) 07/17/2017   110.1 10/21/2013   Epiphora 04/24/2013   Laxity of eyelid 04/24/2013   Anemia 04/09/2013   History of peripheral vascular disease 04/09/2013   History of stroke 04/09/2013   S/P unilateral BKA (below knee amputation) ([place]) 03/17/2013   Diabetes mellitus type 2 in obese ([place]) 03/17/2013   Mixed hyperlipidemia 03/17/2013   Essential hypertension 03/17/2013   Gout 03/17/2013   CKD (chronic kidney disease), stage III ([place]) 03/17/2013   OSA on CPAP 03/17/2013   Anemia, iron deficiency 03/17/2013   Diverticulosis of colon 03/17/2013   RBBB 03/17/2013   Punctal stenosis, acquired 10/02/2012    Conditions to be addressed/monitored: DMII and PAD  Care Plan : Social Work [facility] Plan of Care  Updates made by [name] since 02/11/2021 12:00 AM     Problem: Mobility and Independence      Long-Range Goal: Mobility and Independence Optimized   Start Date: 08/24/2020  This Visit's Progress: On track  Recent Progress: On track  Priority: High  Note:   Current Barriers:  Chronic disease management support and education needs related to DM and PAD   Limited access to caregiver Unable to independently prepare meals  Social Worker Clinical Goal(s):  patient will work with SW to identify and address any acute and/or chronic care coordination needs related to the self health management of DM and PAD   the patient and his daughter will work with  SW to identify caregiver resources for in the home  the patient will be placed on the wait list for mobile meals Goal Met  CCM SW Interventions: Completed with the patients daughter [name] Inter-disciplinary care team collaboration (see longitudinal plan of care) Collaboration with [name], [place] regarding development and update of comprehensive plan of care as evidenced by provider attestation and co-signature Successful outbound call placed to the patients daughter to assess goal progression Discussed the patient has began receiving mobile meals - there is concern with the patients ability to get to the door fast enough prior to a volunteer leaving with the meals Patient is concerned he will lose the benefit as he was told "three strikes and you're out" and he has already missed one delivery  [place] would contact [place] to discuss ways to overcome this challenge such as placing a cooler on the patients front [place] with Gerlene Fee to request he contact [name] to  review solutions Discussed the patient does have a ramp built to allow easier access to and from the home Patient has an appointment with a vascular surgeon on Monday to evaluate a wound and determine if he will need another amputation Assessed for caregiver needs in the home - the patients nephew has moved in with him to assist. He works in [place] but is available in the evenings to assist. The patients local son also stops by to assist with care needs SW to continue to follow  Patient Goals/Self-Care Activities  patient will: With the help of his children  - Attend upcomming appointment to assess wound Contact SW as needed prior to next scheduled call  Follow Up Plan:  SW will follow up with the patient over the next 30 days  to confirm contacted by [employer] regarding mobile meals       Follow Up Plan: SW will follow up with patient by phone over the next  month      [name], [name], [name], Certified Dementia Practitioner [place] [facility] [place] [facility] [phone]

## 2021-02-12 ENCOUNTER — Telehealth: Payer: Self-pay

## 2021-02-12 NOTE — Chronic Care Management (AMB) (Signed)
Chronic Care Management Pharmacy Assistant   Name: Micheal Walls  MRN: [id number] DOB: [birth year]  Reason for Encounter: Medication Review  02/12/2021- Patient's daughter called to discuss new adherence packaging system with [facility]. Patient received medication packs which started on 01/26/2021, daughter instructed patient on how to take when she was in town, followed up with patient a few days ago and he said he forgot to take a few, then had questions about medications that were in there and thought he was missing medications, daughter explained  again that all the medications he needed were in the packs and he is to take as directed, all medications were there. Patient also stated that his great grandkids where playing with packs so he had to stuff then back in the box. Daughter advised patient to put them out of reach of grandkids. Patient also wondered where his [employer] were and when they were coming, daughter explained that she requested refills from PCP to send to [facility] due to cost. Patient stated he is out and will need a new sensor to change out next week. Informed daughter that prescription was sent 01/21/2021 and I will follow up with [employer] to see when sensors will be delivered. Daughter will also follow up with patient today to see what day pack he is on so we can make sure we resync his mediation packages.  [employer], spoke with Victorino Dike, she informed me that patient's Freestyle Libre sensors are being processed, they don't deliver on Friday's, Saturday's or Sunday's and due to Monday being a holiday, sensors will be shipped on Tuesday. Will inform daughter when she returns my call.  Daughter returned call, she spoke with patient and he is on 02/05/2021 pack, he forgot to take nighttime 02/04/2021 pack of medications due to being up late watching a baseball game. Daughter advised if there is family that can drop in, they can drop in today  they can take 02/06/2021 to 02/12/2021 packs from patient and he can start on 02/13/2021 packs to get him back on track. Daughter is going to see if her brother can go by today to take those from patient so he does not get confused on what he should be taking. Daughter also aware about Freestyle sensor delivery from [facility]. Will follow up with patient and daughter in 2 weeks to check status.    Medications: Outpatient Encounter Medications as of 02/12/2021  Medication Sig Note   allopurinol (ZYLOPRIM) 100 MG tablet Take 100 mg by mouth daily.    aspirin EC 81 MG tablet Take 81 mg by mouth daily. Swallow whole.    colchicine 0.6 MG tablet Take 0.6 mg by mouth once a week.    Continuous Blood Gluc Sensor (FREESTYLE LIBRE 14 DAY SENSOR) MISC Use as directed to check blood sugars    diltiazem (CARDIZEM CD) 240 MG 24 hr capsule Take 1 capsule (240 mg total) by mouth daily.    dorzolamide (TRUSOPT) 2 % ophthalmic solution Place 1 drop into both eyes at bedtime.    ferrous sulfate 325 (65 FE) MG tablet Take 325-650 mg by mouth See admin instructions. 1 tab 2 times per day 10/21/2020: Taking 2 tablets Monday, Wednesday and Friday and then 1 tablet on Tuesday, Thursday, Saturday and Sunday.    furosemide (LASIX) 40 MG tablet Take 2 tablets in the morning and one in the evening    gabapentin (NEURONTIN) 100 MG capsule Take 2 capsules in am 1 capsule  mid day and 1 capsule in evening    gentamicin cream (GARAMYCIN) 0.1 % Apply 1 application topically daily. To foot wound with dressing changes (Patient not taking: Reported on 02/04/2021)    glucose blood (FREESTYLE PRECISION NEO TEST) test strip Use as instructed    HYDROcodone-acetaminophen (NORCO/VICODIN) 5-325 MG tablet Take 1 tablet by mouth 2 (two) times daily as needed for severe pain. Take one to two tablets every six to eight hours as needed for pain.    latanoprost (XALATAN) 0.005 % ophthalmic solution Place 1 drop into both eyes at bedtime.      pantoprazole (PROTONIX) 40 MG tablet Take 1 tablet (40 mg total) by mouth daily.    rosuvastatin (CRESTOR) 10 MG tablet Take 1 tablet (10 mg total) by mouth daily.    Semaglutide, [name], (OZEMPIC, [name],) 2 MG/1.5ML SOPN Inject 1 mg into the skin once a week.    senna-docusate (SENOKOT-S) 8.6-50 MG tablet Take 1 tablet by mouth 2 (two) times daily between meals as needed for mild constipation or moderate constipation.    tamsulosin (FLOMAX) 0.4 MG CAPS capsule Take 0.4 mg by mouth daily.    No facility-administered encounter medications on file as of 02/12/2021.    [name], [facility] Pharmacist Assistant [phone]

## 2021-02-18 ENCOUNTER — Ambulatory Visit (INDEPENDENT_AMBULATORY_CARE_PROVIDER_SITE_OTHER): Payer: Medicare Other | Admitting: Vascular Surgery

## 2021-02-18 ENCOUNTER — Encounter: Payer: Self-pay | Admitting: Vascular Surgery

## 2021-02-18 ENCOUNTER — Ambulatory Visit (HOSPITAL_COMMUNITY)
Admission: RE | Admit: 2021-02-18 | Discharge: 2021-02-18 | Disposition: A | Discharge status: Home / Self Care | Payer: Medicare Other | Source: Ambulatory Visit | Attending: Vascular Surgery | Admitting: Vascular Surgery

## 2021-02-18 ENCOUNTER — Other Ambulatory Visit: Payer: Self-pay

## 2021-02-18 VITALS — BP 144/71 | HR 79 | Temp 98.3°F | Resp 20 | Ht 73.0 in | Wt 254.0 lb

## 2021-02-18 DIAGNOSIS — I739 Peripheral vascular disease, unspecified: Secondary | ICD-10-CM | POA: Diagnosis not present

## 2021-02-18 DIAGNOSIS — I872 Venous insufficiency (chronic) (peripheral): Secondary | ICD-10-CM

## 2021-02-18 DIAGNOSIS — I70299 Other atherosclerosis of native arteries of extremities, unspecified extremity: Secondary | ICD-10-CM | POA: Insufficient documentation

## 2021-02-18 DIAGNOSIS — L97909 Non-pressure chronic ulcer of unspecified part of unspecified lower leg with unspecified severity: Secondary | ICD-10-CM | POA: Diagnosis not present

## 2021-02-18 DIAGNOSIS — I6330 Cerebral infarction due to thrombosis of unspecified cerebral artery: Secondary | ICD-10-CM | POA: Diagnosis not present

## 2021-02-18 NOTE — Progress Notes (Signed)
REASON FOR VISIT:   Nonhealing left great toe amputation site.  The consult is requested by Dr. Sherryle Lis.  MEDICAL ISSUES:   COMBINED PERIPHERAL ARTERIAL DISEASE AND CHRONIC VENOUS INSUFFICIENCY: This patient has a nonhealing left great toe wound.  Based on his exam, he has evidence of significant infrainguinal arterial occlusive disease on the left.  He also has diabetes and chronic venous insufficiency all of which put him at high risk for nonhealing.  I think his best chance for limb salvage would be attempted revascularization.  I have recommended we proceed with arteriography.  Unfortunate I cannot palpate a right femoral pulse although this may be related to his position of the wheelchair.  Certainly if he has an inflow problem on the right this would limit our options for an endovascular approach on the left.  Regardless he would like to proceed with arteriography.  I have reviewed with the patient the indications for arteriography. In addition, I have reviewed the potential complications of arteriography including but not limited to: Bleeding, arterial injury, arterial thrombosis, dye action, renal insufficiency, or other unpredictable medical problems. I have explained to the patient that if we find disease amenable to angioplasty we could potentially address this at the same time. I have discussed the potential complications of angioplasty and stenting, including but not limited to: Bleeding, arterial thrombosis, arterial injury, dissection, or the need for surgical intervention.  I offered to proceed tomorrow with an arteriogram but he felt this was too soon.  He would like to do it next week and so we will try to schedule it next week.  We will make further recommendations pending these results.  We may potentially have to use CO2 with limited contrast.  He is clearly at high risk for limb loss and I have explained this to the patient.   HPI:   Micheal Walls is a pleasant 85  y.o. male who I last saw on 02/06/2019 with peripheral arterial disease and an ulcer on his left lower extremity.  I felt that he had combined peripheral arterial disease and chronic venous insufficiency.  The wound healed and we discussed the importance of elevation and compression.  He was then lost to follow-up.  Of note he has a right below the knee amputation.  On my history the patient had a left great toe amputation in April and this has failed to heal.  He has a right below the knee amputation and has a prosthesis but is nonambulatory.  He denies any rest pain of the left foot.  He denies significant drainage from the wound it simply has not healed.  He denies any history of chronic kidney disease.  However his labs suggest that he has CKD 3.  Past Medical History:  Diagnosis Date   Acute metabolic encephalopathy 12/17/7339   Amputated below knee [facility])    right   Anemia    Aspiration pneumonia ([place]) 9/37/9024   Diastolic heart failure (H[facility])    Diverticulosis    DM (diabetes mellitus) ([place])    Gout    Hyperlipemia    OSA on CPAP    RBBB    Sepsis ([place]) 11/02/2019   Systemic hypertension     Family History  Problem Relation Age of Onset   Diabetes Mother    Heart attack Father    Cancer Sister     SOCIAL HISTORY: Social History   Tobacco Use   Smoking status: Former    Types: Landscape architect  Quit date: 06/12/2001    Years since quitting: 19.7   Smokeless tobacco: Never  Substance Use Topics   Alcohol use: No    Alcohol/week: 0.0 standard drinks    Allergies  Allergen Reactions   Vancomycin Rash    Current Outpatient Medications  Medication Sig Dispense Refill   allopurinol (ZYLOPRIM) 100 MG tablet Take 100 mg by mouth daily.     aspirin EC 81 MG tablet Take 81 mg by mouth daily. Swallow whole.     colchicine 0.6 MG tablet Take 0.6 mg by mouth once a week.     Continuous Blood Gluc Sensor (FREESTYLE LIBRE 14 DAY SENSOR) MISC Use as directed to check blood sugars 6  each 1   diltiazem (CARDIZEM CD) 240 MG 24 hr capsule Take 1 capsule (240 mg total) by mouth daily. 90 capsule 3   dorzolamide (TRUSOPT) 2 % ophthalmic solution Place 1 drop into both eyes at bedtime.     ferrous sulfate 325 (65 FE) MG tablet Take 325-650 mg by mouth See admin instructions. 1 tab 2 times per day     furosemide (LASIX) 40 MG tablet Take 2 tablets in the morning and one in the evening 180 tablet 2   gabapentin (NEURONTIN) 100 MG capsule Take 2 capsules in am 1 capsule mid day and 1 capsule in evening 270 capsule 0   gentamicin cream (GARAMYCIN) 0.1 % Apply 1 application topically daily. To foot wound with dressing changes 30 g 1   glucose blood (FREESTYLE PRECISION NEO TEST) test strip Use as instructed 100 each 12   HYDROcodone-acetaminophen (NORCO/VICODIN) 5-325 MG tablet Take 1 tablet by mouth 2 (two) times daily as needed for severe pain. Take one to two tablets every six to eight hours as needed for pain. 10 tablet 0   latanoprost (XALATAN) 0.005 % ophthalmic solution Place 1 drop into both eyes at bedtime.      pantoprazole (PROTONIX) 40 MG tablet Take 1 tablet (40 mg total) by mouth daily. 90 tablet 1   rosuvastatin (CRESTOR) 10 MG tablet Take 1 tablet (10 mg total) by mouth daily. 90 tablet 3   Semaglutide, [name], (OZEMPIC, [name],) 2 MG/1.5ML SOPN Inject 1 mg into the skin once a week. 9 mL 1   senna-docusate (SENOKOT-S) 8.6-50 MG tablet Take 1 tablet by mouth 2 (two) times daily between meals as needed for mild constipation or moderate constipation. 60 tablet 0   tamsulosin (FLOMAX) 0.4 MG CAPS capsule Take 0.4 mg by mouth daily.     No current facility-administered medications for this visit.    REVIEW OF SYSTEMS:  $RemoveB'[X]'gvDqSuvI$  denotes positive finding, $RemoveBeforeDEI'[ ]'[id number]$  denotes negative finding Cardiac  Comments:  Chest pain or chest pressure:    Shortness of breath upon exertion:    Short of breath when lying flat:    Irregular heart rhythm:        Vascular    Pain in calf,  thigh, or hip brought on by ambulation:    Pain in feet at night that wakes you up from your sleep:     Blood clot in your veins:    Leg swelling:         Pulmonary    Oxygen at home:    Productive cough:     Wheezing:         Neurologic    Sudden weakness in arms or legs:     Sudden numbness in arms or legs:     Sudden  onset of difficulty speaking or slurred speech:    Temporary loss of vision in one eye:     Problems with dizziness:         Gastrointestinal    Blood in stool:     Vomited blood:         Genitourinary    Burning when urinating:     Blood in urine:        Psychiatric    Major depression:         Hematologic    Bleeding problems:    Problems with blood clotting too easily:        Skin    Rashes or ulcers:        Constitutional    Fever or chills:     PHYSICAL EXAM:   Vitals:   02/18/21 0918  BP: (!) 144/71  Pulse: 79  Resp: 20  Temp: 98.3 F (36.8 C)  SpO2: 96%  Weight: 254 lb (115.2 kg)  Height: $Remove'6\' 1"'glivCVh$  (1.854 m)   GENERAL: The patient is a well-nourished male, in no acute distress. The vital signs are documented above. CARDIAC: There is a regular rate and rhythm.  VASCULAR: On exam he has a normal left femoral pulse.  I had a difficult time palpating his right femoral pulse.  He was in the wheelchair and it may be related to his positioning. He has significant swelling in the left lower extremity related to his chronic venous insufficiency.     PULMONARY: There is good air exchange bilaterally without wheezing or rales. ABDOMEN: Soft and non-tender with normal pitched bowel sounds.  MUSCULOSKELETAL: There are no major deformities or cyanosis. NEUROLOGIC: No focal weakness or paresthesias are detected. SKIN: His left great toe amputation site is not healing.   PSYCHIATRIC: The patient has a normal affect.  DATA:    ARTERIAL DOPPLER STUDY: I have independently interpreted his arterial Doppler study today.  This was of the left lower  extremity only.  He has a right below the knee amputation.  He had a monophasic dorsalis pedis signal with no posterior tibial signal.  The artery was calcified and an ABI could not be obtained.  LABS: I reviewed his labs from 02/04/2021.  His creatinine was 1.53.  GFR was 45.  He has a history of thrombocytopenia however his platelet count on 10/29/2020 was 240,000.  Deitra Mayo [facility] [phone]

## 2021-02-18 NOTE — H&P (View-Only) (Signed)
REASON FOR VISIT:   Nonhealing left great toe amputation site.  The consult is requested by Dr. Sherryle Lis.  MEDICAL ISSUES:   COMBINED PERIPHERAL ARTERIAL DISEASE AND CHRONIC VENOUS INSUFFICIENCY: This patient has a nonhealing left great toe wound.  Based on his exam, he has evidence of significant infrainguinal arterial occlusive disease on the left.  He also has diabetes and chronic venous insufficiency all of which put him at high risk for nonhealing.  I think his best chance for limb salvage would be attempted revascularization.  I have recommended we proceed with arteriography.  Unfortunate I cannot palpate a right femoral pulse although this may be related to his position of the wheelchair.  Certainly if he has an inflow problem on the right this would limit our options for an endovascular approach on the left.  Regardless he would like to proceed with arteriography.  I have reviewed with the patient the indications for arteriography. In addition, I have reviewed the potential complications of arteriography including but not limited to: Bleeding, arterial injury, arterial thrombosis, dye action, renal insufficiency, or other unpredictable medical problems. I have explained to the patient that if we find disease amenable to angioplasty we could potentially address this at the same time. I have discussed the potential complications of angioplasty and stenting, including but not limited to: Bleeding, arterial thrombosis, arterial injury, dissection, or the need for surgical intervention.  I offered to proceed tomorrow with an arteriogram but he felt this was too soon.  He would like to do it next week and so we will try to schedule it next week.  We will make further recommendations pending these results.  We may potentially have to use CO2 with limited contrast.  He is clearly at high risk for limb loss and I have explained this to the patient.   HPI:   Micheal Walls is a pleasant 85  y.o. male who I last saw on 02/06/2019 with peripheral arterial disease and an ulcer on his left lower extremity.  I felt that he had combined peripheral arterial disease and chronic venous insufficiency.  The wound healed and we discussed the importance of elevation and compression.  He was then lost to follow-up.  Of note he has a right below the knee amputation.  On my history the patient had a left great toe amputation in April and this has failed to heal.  He has a right below the knee amputation and has a prosthesis but is nonambulatory.  He denies any rest pain of the left foot.  He denies significant drainage from the wound it simply has not healed.  He denies any history of chronic kidney disease.  However his labs suggest that he has CKD 3.  Past Medical History:  Diagnosis Date   Acute metabolic encephalopathy 0/0/9381   Amputated below knee [facility])    right   Anemia    Aspiration pneumonia ([place]) 02/08/9370   Diastolic heart failure (HCC)    Diverticulosis    DM (diabetes mellitus) ([place])    Gout    Hyperlipemia    OSA on CPAP    RBBB    Sepsis ([place]) 11/02/2019   Systemic hypertension     Family History  Problem Relation Age of Onset   Diabetes Mother    Heart attack Father    Cancer Sister     SOCIAL HISTORY: Social History   Tobacco Use   Smoking status: Former    Types: Landscape architect  Quit date: 06/12/2001    Years since quitting: 19.7   Smokeless tobacco: Never  Substance Use Topics   Alcohol use: No    Alcohol/week: 0.0 standard drinks    Allergies  Allergen Reactions   Vancomycin Rash    Current Outpatient Medications  Medication Sig Dispense Refill   allopurinol (ZYLOPRIM) 100 MG tablet Take 100 mg by mouth daily.     aspirin EC 81 MG tablet Take 81 mg by mouth daily. Swallow whole.     colchicine 0.6 MG tablet Take 0.6 mg by mouth once a week.     Continuous Blood Gluc Sensor (FREESTYLE LIBRE 14 DAY SENSOR) MISC Use as directed to check blood sugars 6  each 1   diltiazem (CARDIZEM CD) 240 MG 24 hr capsule Take 1 capsule (240 mg total) by mouth daily. 90 capsule 3   dorzolamide (TRUSOPT) 2 % ophthalmic solution Place 1 drop into both eyes at bedtime.     ferrous sulfate 325 (65 FE) MG tablet Take 325-650 mg by mouth See admin instructions. 1 tab 2 times per day     furosemide (LASIX) 40 MG tablet Take 2 tablets in the morning and one in the evening 180 tablet 2   gabapentin (NEURONTIN) 100 MG capsule Take 2 capsules in am 1 capsule mid day and 1 capsule in evening 270 capsule 0   gentamicin cream (GARAMYCIN) 0.1 % Apply 1 application topically daily. To foot wound with dressing changes 30 g 1   glucose blood (FREESTYLE PRECISION NEO TEST) test strip Use as instructed 100 each 12   HYDROcodone-acetaminophen (NORCO/VICODIN) 5-325 MG tablet Take 1 tablet by mouth 2 (two) times daily as needed for severe pain. Take one to two tablets every six to eight hours as needed for pain. 10 tablet 0   latanoprost (XALATAN) 0.005 % ophthalmic solution Place 1 drop into both eyes at bedtime.      pantoprazole (PROTONIX) 40 MG tablet Take 1 tablet (40 mg total) by mouth daily. 90 tablet 1   rosuvastatin (CRESTOR) 10 MG tablet Take 1 tablet (10 mg total) by mouth daily. 90 tablet 3   Semaglutide, [name], (OZEMPIC, [name],) 2 MG/1.5ML SOPN Inject 1 mg into the skin once a week. 9 mL 1   senna-docusate (SENOKOT-S) 8.6-50 MG tablet Take 1 tablet by mouth 2 (two) times daily between meals as needed for mild constipation or moderate constipation. 60 tablet 0   tamsulosin (FLOMAX) 0.4 MG CAPS capsule Take 0.4 mg by mouth daily.     No current facility-administered medications for this visit.    REVIEW OF SYSTEMS:  $RemoveB'[X]'VHaVvQAQ$  denotes positive finding, $RemoveBeforeDEI'[ ]'OjmaexkeckPRApRX$  denotes negative finding Cardiac  Comments:  Chest pain or chest pressure:    Shortness of breath upon exertion:    Short of breath when lying flat:    Irregular heart rhythm:        Vascular    Pain in calf,  thigh, or hip brought on by ambulation:    Pain in feet at night that wakes you up from your sleep:     Blood clot in your veins:    Leg swelling:         Pulmonary    Oxygen at home:    Productive cough:     Wheezing:         Neurologic    Sudden weakness in arms or legs:     Sudden numbness in arms or legs:     Sudden  onset of difficulty speaking or slurred speech:    Temporary loss of vision in one eye:     Problems with dizziness:         Gastrointestinal    Blood in stool:     Vomited blood:         Genitourinary    Burning when urinating:     Blood in urine:        Psychiatric    Major depression:         Hematologic    Bleeding problems:    Problems with blood clotting too easily:        Skin    Rashes or ulcers:        Constitutional    Fever or chills:     PHYSICAL EXAM:   Vitals:   02/18/21 0918  BP: (!) 144/71  Pulse: 79  Resp: 20  Temp: 98.3 F (36.8 C)  SpO2: 96%  Weight: 254 lb (115.2 kg)  Height: $Remove'6\' 1"'EzCHqdJ$  (1.854 m)   GENERAL: The patient is a well-nourished male, in no acute distress. The vital signs are documented above. CARDIAC: There is a regular rate and rhythm.  VASCULAR: On exam he has a normal left femoral pulse.  I had a difficult time palpating his right femoral pulse.  He was in the wheelchair and it may be related to his positioning. He has significant swelling in the left lower extremity related to his chronic venous insufficiency.     PULMONARY: There is good air exchange bilaterally without wheezing or rales. ABDOMEN: Soft and non-tender with normal pitched bowel sounds.  MUSCULOSKELETAL: There are no major deformities or cyanosis. NEUROLOGIC: No focal weakness or paresthesias are detected. SKIN: His left great toe amputation site is not healing.   PSYCHIATRIC: The patient has a normal affect.  DATA:    ARTERIAL DOPPLER STUDY: I have independently interpreted his arterial Doppler study today.  This was of the left lower  extremity only.  He has a right below the knee amputation.  He had a monophasic dorsalis pedis signal with no posterior tibial signal.  The artery was calcified and an ABI could not be obtained.  LABS: I reviewed his labs from 02/04/2021.  His creatinine was 1.53.  GFR was 45.  He has a history of thrombocytopenia however his platelet count on 10/29/2020 was 240,000.  Deitra Mayo [facility] [phone]

## 2021-02-19 ENCOUNTER — Encounter: Payer: Self-pay | Admitting: Nurse Practitioner

## 2021-02-23 ENCOUNTER — Telehealth: Payer: Self-pay

## 2021-02-23 NOTE — Chronic Care Management (AMB) (Signed)
Chronic Care Management Pharmacy Assistant   Name: Micheal Walls  MRN: [id number] DOB: Nov 21, 1935   Reason for Encounter: Disease State/ Diabetes   Recent office visits:  02-04-2021 [name], [place]. Referral placed to neurology. BUN= 37, Creatinine= 1.53, eGFR= 45, Chloride= 107, C02= 19. A1C= 6.5. Total iron binding= 246. TSH= 5.130.  02-11-2021 [name] (CCM)  Recent consult visits:  01-21-2021 [name], DPM (Podiatry). Wound exam. Referral placed to Vascular surgery.  02-02-2021 [name], DPM (Podiatry). Excisional  debridement of wound.  02-18-2021 Angelia Mould, MD (Vascular surgery). Toe exam.  Hospital visits:  None in previous 6 months  Medications: Outpatient Encounter Medications as of 02/23/2021  Medication Sig   allopurinol (ZYLOPRIM) 100 MG tablet Take 100 mg by mouth 2 (two) times daily.   aspirin EC 81 MG tablet Take 81 mg by mouth daily. Swallow whole.   colchicine 0.6 MG tablet Take 0.6 mg by mouth once a week.   Continuous Blood Gluc Sensor (FREESTYLE LIBRE 14 DAY SENSOR) MISC Use as directed to check blood sugars   diltiazem (CARDIZEM CD) 240 MG 24 hr capsule Take 1 capsule (240 mg total) by mouth daily.   dorzolamide (TRUSOPT) 2 % ophthalmic solution Place 1 drop into both eyes 2 (two) times daily.   ferrous sulfate 325 (65 FE) MG tablet Take 325 mg by mouth 2 (two) times daily with a meal.   furosemide (LASIX) 40 MG tablet Take 2 tablets in the morning and one in the evening   gabapentin (NEURONTIN) 100 MG capsule Take 2 capsules in am 1 capsule mid day and 1 capsule in evening (Patient taking differently: Take 100-200 mg by mouth See admin instructions. Take 200 mg in am 100 mg evening)   gentamicin cream (GARAMYCIN) 0.1 % Apply 1 application topically daily. To foot wound with dressing changes   glucose blood (FREESTYLE PRECISION NEO TEST) test strip Use as instructed   ketotifen (ALAWAY) 0.025 % ophthalmic solution  Place 1 drop into both eyes 2 (two) times daily.   latanoprost (XALATAN) 0.005 % ophthalmic solution Place 1 drop into both eyes at bedtime.    Multiple Vitamins-Minerals (MULTIVITAMIN WITH MINERALS) tablet Take 1 tablet by mouth daily. Centrum men 50+   Multiple Vitamins-Minerals (PRESERVISION AREDS) CAPS Take 1 capsule by mouth 2 (two) times daily.   pantoprazole (PROTONIX) 40 MG tablet Take 1 tablet (40 mg total) by mouth daily. (Patient not taking: Reported on 02/23/2021)   Polyvinyl Alcohol-Povidone PF (REFRESH) 1.4-0.6 % SOLN Place 1 drop into both eyes 2 (two) times daily.   rosuvastatin (CRESTOR) 10 MG tablet Take 1 tablet (10 mg total) by mouth daily.   Semaglutide, [name], (OZEMPIC, [name],) 2 MG/1.5ML SOPN Inject 1 mg into the skin once a week.   senna-docusate (SENOKOT-S) 8.6-50 MG tablet Take 1 tablet by mouth 2 (two) times daily between meals as needed for mild constipation or moderate constipation. (Patient taking differently: Take 1 tablet by mouth 2 (two) times daily.)   tamsulosin (FLOMAX) 0.4 MG CAPS capsule Take 0.4 mg by mouth daily.   No facility-administered encounter medications on file as of 02/23/2021.   Recent Relevant Labs: Lab Results  Component Value Date/Time   HGBA1C 6.5 (H) 02/04/2021 09:50 AM   HGBA1C 6.3 (H) 10/29/2020 11:56 AM   MICROALBUR 30 09/28/2020 03:58 PM   MICROALBUR 80 05/15/2019 10:28 AM    Kidney Function Lab Results  Component Value Date/Time   CREATININE 1.53 (H)  02/04/2021 09:50 AM   CREATININE 1.80 (H) 11/12/2020 11:42 AM   CREATININE 1.85 (H) 08/27/2020 03:25 PM   GFRNONAA 35 (L) 10/24/2020 03:25 AM   GFRAA 42 (L) 05/20/2020 09:51 AM    Current antihyperglycemic regimen:  Ozempic 1 mg weekly  What recent interventions/DTPs have been made to improve glycemic control:  Educated on A1c and blood sugar goals Complications of diabetes including kidney damage, retinal damage, and cardiovascular disease Prevention and management of  hypoglycemic episodes; Benefits of routine self-monitoring of blood sugar  Have there been any recent hospitalizations or ED visits since last visit with CPP? Yes  Patient denies hypoglycemic symptoms  Patient denies hyperglycemic symptoms  How often are you checking your blood sugar? once daily  What are your blood sugars ranging?  Fasting: 125, 139, 103,116, 103 Before meals: None After meals: None Bedtime: None  During the week, how often does your blood glucose drop below 70? Never  Are you checking your feet daily/regularly? Patient states daily  Adherence Review: Is the patient currently on a STATIN medication? Yes Is the patient currently on ACE/ARB medication? No Does the patient have >5 day gap between last estimated fill dates? No  NOTES: Scheduled patient a telephone appointment with [name] CPP on 03-31-2021. Patient's daughter was informed and will like to be in on the call.  Care Gaps: Shingrix overdue Covid booster overdue AWV 05-20-2021  Star Rating Drugs: Ozempic 1 mg- Last filled 01-21-2021 for 90 DS [facility] mail order Rosuvastatin 10 mg- Last filled 01-19-2021 [facility]  [place] Clinical Pharmacist Assistant [phone]

## 2021-02-25 ENCOUNTER — Telehealth: Payer: Medicare Other

## 2021-02-25 DIAGNOSIS — E785 Hyperlipidemia, unspecified: Secondary | ICD-10-CM | POA: Diagnosis not present

## 2021-02-25 DIAGNOSIS — M109 Gout, unspecified: Secondary | ICD-10-CM | POA: Diagnosis not present

## 2021-02-25 DIAGNOSIS — I129 Hypertensive chronic kidney disease with stage 1 through stage 4 chronic kidney disease, or unspecified chronic kidney disease: Secondary | ICD-10-CM | POA: Diagnosis not present

## 2021-02-25 DIAGNOSIS — N183 Chronic kidney disease, stage 3 unspecified: Secondary | ICD-10-CM | POA: Diagnosis not present

## 2021-02-25 DIAGNOSIS — E118 Type 2 diabetes mellitus with unspecified complications: Secondary | ICD-10-CM | POA: Diagnosis not present

## 2021-02-25 DIAGNOSIS — N3289 Other specified disorders of bladder: Secondary | ICD-10-CM | POA: Diagnosis not present

## 2021-02-25 DIAGNOSIS — Z6841 Body Mass Index (BMI) 40.0 and over, adult: Secondary | ICD-10-CM | POA: Diagnosis not present

## 2021-02-26 ENCOUNTER — Ambulatory Visit (HOSPITAL_COMMUNITY)
Admission: RE | Admit: 2021-02-26 | Discharge: 2021-02-26 | Disposition: A | Discharge status: Home / Self Care | Payer: Medicare Other | Attending: Vascular Surgery | Admitting: Vascular Surgery

## 2021-02-26 ENCOUNTER — Ambulatory Visit: Payer: Medicare Other | Admitting: Cardiovascular Disease

## 2021-02-26 ENCOUNTER — Encounter (HOSPITAL_COMMUNITY)
Admission: RE | Disposition: A | Discharge status: Home / Self Care | Payer: Self-pay | Source: Home / Self Care | Attending: Vascular Surgery

## 2021-02-26 ENCOUNTER — Other Ambulatory Visit: Payer: Self-pay

## 2021-02-26 DIAGNOSIS — Z89511 Acquired absence of right leg below knee: Secondary | ICD-10-CM | POA: Diagnosis not present

## 2021-02-26 DIAGNOSIS — Z7982 Long term (current) use of aspirin: Secondary | ICD-10-CM | POA: Diagnosis not present

## 2021-02-26 DIAGNOSIS — Z833 Family history of diabetes mellitus: Secondary | ICD-10-CM | POA: Diagnosis not present

## 2021-02-26 DIAGNOSIS — Z881 Allergy status to other antibiotic agents status: Secondary | ICD-10-CM | POA: Insufficient documentation

## 2021-02-26 DIAGNOSIS — L97529 Non-pressure chronic ulcer of other part of left foot with unspecified severity: Secondary | ICD-10-CM | POA: Diagnosis not present

## 2021-02-26 DIAGNOSIS — I70245 Atherosclerosis of native arteries of left leg with ulceration of other part of foot: Secondary | ICD-10-CM | POA: Diagnosis not present

## 2021-02-26 DIAGNOSIS — E11621 Type 2 diabetes mellitus with foot ulcer: Secondary | ICD-10-CM | POA: Diagnosis not present

## 2021-02-26 DIAGNOSIS — Z79899 Other long term (current) drug therapy: Secondary | ICD-10-CM | POA: Insufficient documentation

## 2021-02-26 DIAGNOSIS — I7092 Chronic total occlusion of artery of the extremities: Secondary | ICD-10-CM | POA: Diagnosis not present

## 2021-02-26 DIAGNOSIS — E1151 Type 2 diabetes mellitus with diabetic peripheral angiopathy without gangrene: Secondary | ICD-10-CM | POA: Insufficient documentation

## 2021-02-26 DIAGNOSIS — Z87891 Personal history of nicotine dependence: Secondary | ICD-10-CM | POA: Diagnosis not present

## 2021-02-26 HISTORY — PX: ABDOMINAL AORTOGRAM W/LOWER EXTREMITY: CATH118223

## 2021-02-26 HISTORY — PX: PERIPHERAL VASCULAR INTERVENTION: CATH118257

## 2021-02-26 LAB — POCT I-STAT, CHEM 8
BUN: 59 mg/dL — ABNORMAL HIGH (ref 8–23)
Calcium, Ion: 1.34 mmol/L (ref 1.15–1.40)
Chloride: 101 mmol/L (ref 98–111)
Creatinine, Ser: 2.4 mg/dL — ABNORMAL HIGH (ref 0.61–1.24)
Glucose, Bld: 136 mg/dL — ABNORMAL HIGH (ref 70–99)
HCT: 31 % — ABNORMAL LOW (ref 39.0–52.0)
Hemoglobin: 10.5 g/dL — ABNORMAL LOW (ref 13.0–17.0)
Potassium: 3.6 mmol/L (ref 3.5–5.1)
Sodium: 140 mmol/L (ref 135–145)
TCO2: 28 mmol/L (ref 22–32)

## 2021-02-26 LAB — POCT ACTIVATED CLOTTING TIME
Activated Clotting Time: 202 seconds
Activated Clotting Time: 179 seconds

## 2021-02-26 LAB — GLUCOSE, CAPILLARY: Glucose-Capillary: 106 mg/dL — ABNORMAL HIGH (ref 70–99)

## 2021-02-26 SURGERY — ABDOMINAL AORTOGRAM W/LOWER EXTREMITY
Anesthesia: LOCAL

## 2021-02-26 MED ORDER — SODIUM CHLORIDE 0.9 % WEIGHT BASED INFUSION
1.0000 mL/kg/h | INTRAVENOUS | Status: DC
  Administered 2021-02-26: 1 mL/kg/h via INTRAVENOUS

## 2021-02-26 MED ORDER — FENTANYL CITRATE (PF) 100 MCG/2ML IJ SOLN
INTRAMUSCULAR | Status: AC
  Filled 2021-02-26: qty 2

## 2021-02-26 MED ORDER — LIDOCAINE HCL (PF) 1 % IJ SOLN
INTRAMUSCULAR | Status: AC
  Filled 2021-02-26: qty 30

## 2021-02-26 MED ORDER — HEPARIN SODIUM (PORCINE) 1000 UNIT/ML IJ SOLN
INTRAMUSCULAR | Status: AC
  Filled 2021-02-26: qty 1

## 2021-02-26 MED ORDER — HEPARIN (PORCINE) IN NACL 1000-0.9 UT/500ML-% IV SOLN
INTRAVENOUS | Status: AC
  Filled 2021-02-26: qty 1000

## 2021-02-26 MED ORDER — HEPARIN (PORCINE) IN NACL 1000-0.9 UT/500ML-% IV SOLN
INTRAVENOUS | Status: DC | PRN
  Administered 2021-02-26 (×2): 500 mL

## 2021-02-26 MED ORDER — MIDAZOLAM HCL 2 MG/2ML IJ SOLN
INTRAMUSCULAR | Status: AC
  Filled 2021-02-26: qty 2

## 2021-02-26 MED ORDER — ACETAMINOPHEN 325 MG PO TABS: 650.0000 mg | ORAL_TABLET | ORAL | Status: DC | PRN

## 2021-02-26 MED ORDER — SODIUM CHLORIDE 0.9% FLUSH: 3.0000 mL | INTRAVENOUS | Status: DC | PRN

## 2021-02-26 MED ORDER — HYDRALAZINE HCL 20 MG/ML IJ SOLN
5.0000 mg | INTRAMUSCULAR | Status: DC | PRN
Start: 2021-02-26 — End: 2021-02-26

## 2021-02-26 MED ORDER — OXYCODONE-ACETAMINOPHEN 5-325 MG PO TABS
ORAL_TABLET | ORAL | Status: AC
  Filled 2021-02-26: qty 2

## 2021-02-26 MED ORDER — OXYCODONE-ACETAMINOPHEN 5-325 MG PO TABS
2.0000 | ORAL_TABLET | Freq: Once | ORAL | Status: AC
  Administered 2021-02-26: 2 via ORAL

## 2021-02-26 MED ORDER — SODIUM CHLORIDE 0.9% FLUSH: 3.0000 mL | Freq: Two times a day (BID) | INTRAVENOUS | Status: DC

## 2021-02-26 MED ORDER — SODIUM CHLORIDE 0.9 % IV SOLN: 250.0000 mL | INTRAVENOUS | Status: DC | PRN

## 2021-02-26 MED ORDER — FENTANYL CITRATE (PF) 100 MCG/2ML IJ SOLN
INTRAMUSCULAR | Status: DC | PRN
  Administered 2021-02-26: 50 ug via INTRAVENOUS

## 2021-02-26 MED ORDER — IODIXANOL 320 MG/ML IV SOLN
INTRAVENOUS | Status: DC | PRN
  Administered 2021-02-26: 15 mL

## 2021-02-26 MED ORDER — SODIUM CHLORIDE 0.9 % IV SOLN: INTRAVENOUS | Status: DC

## 2021-02-26 MED ORDER — LIDOCAINE HCL (PF) 1 % IJ SOLN
INTRAMUSCULAR | Status: DC | PRN
  Administered 2021-02-26: 2 mL via INTRADERMAL
  Administered 2021-02-26: 20 mL via INTRADERMAL

## 2021-02-26 MED ORDER — MIDAZOLAM HCL 2 MG/2ML IJ SOLN
INTRAMUSCULAR | Status: DC | PRN
  Administered 2021-02-26: 1 mg via INTRAVENOUS

## 2021-02-26 MED ORDER — ONDANSETRON HCL 4 MG/2ML IJ SOLN: 4.0000 mg | Freq: Four times a day (QID) | INTRAMUSCULAR | Status: DC | PRN

## 2021-02-26 MED ORDER — LABETALOL HCL 5 MG/ML IV SOLN: 10.0000 mg | INTRAVENOUS | Status: DC | PRN

## 2021-02-26 MED ORDER — HEPARIN SODIUM (PORCINE) 1000 UNIT/ML IJ SOLN
INTRAMUSCULAR | Status: DC | PRN
  Administered 2021-02-26: 10000 [IU] via INTRAVENOUS

## 2021-02-26 SURGICAL SUPPLY — 24 items
CATH ANGIO 5F PIGTAIL 65CM (CATHETERS) ×3 IMPLANT
CATH CXI 2.3F 150 ST (CATHETERS) ×3 IMPLANT
CATH NAVICROSS ANGLED 135CM (MICROCATHETER) ×3 IMPLANT
CATH QUICKCROSS .018X135CM (MICROCATHETER) ×3 IMPLANT
CATH SOFT-VU 4F 65 STRAIGHT (CATHETERS) ×2 IMPLANT
CATH SOFT-VU STRAIGHT 4F 65CM (CATHETERS) ×3
DEVICE TORQUE H2O (MISCELLANEOUS) ×3 IMPLANT
GUIDEWIRE ANGLED .035X150CM (WIRE) ×3 IMPLANT
GUIDEWIRE ZILIENT 30G 014 (WIRE) ×3 IMPLANT
GUIDEWIRE ZILIENT 6G 014 (WIRE) ×3 IMPLANT
KIT ANGIASSIST CO2 SYSTEM (KITS) ×3 IMPLANT
KIT MICROPUNCTURE NIT STIFF (SHEATH) ×3 IMPLANT
KIT PV (KITS) ×3 IMPLANT
SHEATH MICROPUNCTURE PEDAL 4FR (SHEATH) ×3 IMPLANT
SHEATH PINNACLE 5F 10CM (SHEATH) ×6 IMPLANT
SHEATH PROBE COVER 6X72 (BAG) ×6 IMPLANT
SHEATH SHUTTLE 5F/110 (SHEATH) ×3 IMPLANT
STOPCOCK MORSE 400PSI 3WAY (MISCELLANEOUS) ×3 IMPLANT
SYR MEDRAD MARK 7 150ML (SYRINGE) ×3 IMPLANT
TRANSDUCER W/STOPCOCK (MISCELLANEOUS) ×3 IMPLANT
TRAY PV CATH (CUSTOM PROCEDURE TRAY) ×3 IMPLANT
WIRE BENTSON .035X145CM (WIRE) ×3 IMPLANT
WIRE G V18X300CM (WIRE) ×3 IMPLANT
WIRE ROSEN-J .035X260CM (WIRE) ×3 IMPLANT

## 2021-02-26 NOTE — Progress Notes (Signed)
Assumed manual pressure on right groin due to rebleed.  Pressure maintained for 15 minutes. Site stable , site level 1. Hematoma reduced, slight hematoma present 2 inch by 4 inch area of previous hematoma. Area of previous hematoma marked.   Right popliteal pulse palpable.  Tegaderm dressing applied.  Bedrest instructions given.    Bedrest begins at 13:00:00

## 2021-02-26 NOTE — Op Note (Signed)
DATE OF SERVICE: 02/26/2021  PATIENT:  Micheal Walls  85 y.o. male  PRE-OPERATIVE DIAGNOSIS:  Atherosclerosis of native arteries of left lower extremity lower extremity causing ulceration  POST-OPERATIVE DIAGNOSIS:  Same  PROCEDURE:   1) US guided right common femoral artery access 2) US guided left anterior tibial artery access 3) Aortogram 4) Left lower extremity angiogram with third order cannulation (70mL total contrast) 5) Conscious sedation (73 minutes)  SURGEON:  Yevonne Aline. Stanford Breed, MD  ASSISTANT: none  ANESTHESIA:   local and IV sedation  ESTIMATED BLOOD LOSS: min  LOCAL MEDICATIONS USED:  LIDOCAINE   COUNTS: confirmed correct.  PATIENT DISPOSITION:  PACU - hemodynamically stable.   Delay start of Pharmacological VTE agent (>24hrs) due to surgical blood loss or risk of bleeding: no  INDICATION FOR PROCEDURE: Micheal Walls is a 85 y.o. male with ischemic ulceration of the left foot. After careful discussion of risks, benefits, and alternatives the patient was offered angiography with possible intervention. We specifically discussed access site complications. The patient understood and wished to proceed.  OPERATIVE FINDINGS:  Terminal aorta and iliac arteries: Widely patent  Left lower extremity: Common femoral artery: widely patent  Profunda femoris artery: widely patent   Superficial femoral artery: widely patent  Popliteal artery: widely patent  Anterior tibial artery: patient at the origin. Occludes distal third of calf. Reconstitutes about the ankle Tibioperoneal trunk: patent Peroneal artery: patent to mid calf. Tapers to occlusion above the ankle. Posterior tibial artery: occluded at the origin. Reconstitutes about the ankle Pedal circulation: disadvantaged. Fills via collaterals to DP.  DESCRIPTION OF PROCEDURE: After identification of the patient in the pre-operative holding area, the patient was transferred to the operating room. The patient was  positioned supine on the operating room table. Anesthesia was induced. The groins was prepped and draped in standard fashion. A surgical pause was performed confirming correct patient, procedure, and operative location.  The right groin was anesthetized with subcutaneous injection of 1% lidocaine. Using ultrasound guidance, the right common femoral artery was accessed with micropuncture technique. Fluoroscopy was used to confirm cannulation over the femoral head. The 741F sheath was upsized to 41F.   A Benson wire was advanced into the distal aorta. Over the wire an omni flush catheter was advanced to the level of L2. Aortogram was performed - see above for details.  The left common iliac artery was selected with a glidewire advantage. The wire was advanced into the common femoral artery. Over the wire the omni flush catheter was advanced into the external iliac artery. Selective angiography was performed - see above for details.   The decision was made to intervene. The patient was heparinized with 10,000 units of heparin. The 41F sheath was exchanged for a 41F x 110cm sheath. Selective angiography of the left lower extremity was performed prior to intervention.   Multiple wire and catheter exchanges were performed.  I was able to partially cross the chronic occlusion of the distal anterior tibial artery antegrade.  I was not able to reenter into the native artery.  I accessed the anterior artery retrograde.  I was unable to advance a wire retrograde into the CTO.  The access site was severely calcified and would not except a pedal access sheath.  The decision was made to end the case here.  Patient was transferred to the PACU with a 5 French sheath in his groin.  Sheath will be removed once his ACT has normalized.  Conscious sedation was administered with  the use of IV fentanyl and midazolam under continuous physician and nurse monitoring.  Heart rate, blood pressure, and oxygen saturation were  continuously monitored.  Total sedation time was 73 minutes  Upon completion of the case instrument and sharps counts were confirmed correct. The patient was transferred to the PACU in good condition. I was present for all portions of the procedure.  PLAN: No further options for revascularization.  He likely will heal a below-knee amputation.  We will arrange for follow-up next week in clinic to discuss options.  Yevonne Aline. Stanford Breed, MD [facility] Phone Number: [phone] 02/26/2021 9:29 AM

## 2021-02-26 NOTE — Interval H&P Note (Signed)
History and Physical Interval Note:  02/26/2021 7:26 AM  Micheal Walls  has presented today for surgery, with the diagnosis of pvd.  The various methods of treatment have been discussed with the patient and family. After consideration of risks, benefits and other options for treatment, the patient has consented to  Procedure(s): ABDOMINAL AORTOGRAM W/LOWER EXTREMITY (N/A) as a surgical intervention.  The patient's history has been reviewed, patient examined, no change in status, stable for surgery.  I have reviewed the patient's chart and labs.  Questions were answered to the patient's satisfaction.     Cherre Robins

## 2021-02-26 NOTE — Progress Notes (Signed)
SITE AREA: right groin/femoral  SITE PRIOR TO REMOVAL:  LEVEL 0  PRESSURE APPLIED FOR: approximately 20 minutes  MANUAL: yes   Pt condition: stable  POST PULL SITE:  LEVEL 0  POST PULL INSTRUCTIONS GIVEN: yes  POST PULL PULSES PRESENT: right femoral +2, right popliteal +1  DRESSING APPLIED: gauze with tegaderm  BEDREST BEGINS @ 1148  COMMENTS:

## 2021-03-01 ENCOUNTER — Encounter (HOSPITAL_COMMUNITY): Payer: Self-pay | Admitting: Vascular Surgery

## 2021-03-02 ENCOUNTER — Encounter: Payer: Self-pay | Admitting: Podiatry

## 2021-03-02 ENCOUNTER — Other Ambulatory Visit: Payer: Self-pay | Admitting: Nurse Practitioner

## 2021-03-02 DIAGNOSIS — Z79899 Other long term (current) drug therapy: Secondary | ICD-10-CM

## 2021-03-03 ENCOUNTER — Encounter (HOSPITAL_COMMUNITY): Payer: Self-pay | Admitting: Vascular Surgery

## 2021-03-04 ENCOUNTER — Ambulatory Visit (INDEPENDENT_AMBULATORY_CARE_PROVIDER_SITE_OTHER): Payer: Medicare Other | Admitting: Podiatry

## 2021-03-04 ENCOUNTER — Other Ambulatory Visit: Payer: Self-pay

## 2021-03-04 DIAGNOSIS — L97522 Non-pressure chronic ulcer of other part of left foot with fat layer exposed: Secondary | ICD-10-CM

## 2021-03-04 DIAGNOSIS — I739 Peripheral vascular disease, unspecified: Secondary | ICD-10-CM | POA: Diagnosis not present

## 2021-03-09 NOTE — Progress Notes (Signed)
  Subjective:  Patient ID: Micheal Walls, male    DOB: 23-Nov-1935,  MRN: [id number]  Chief Complaint  Patient presents with   Foot Ulcer    Wound care left foot     DOS: 09/14/2020 Procedure: Left hallux amputation  85 y.o. male returns for post-op check.  Unfortunately his angiography showed that there are no significant options to revascularize his leg  Review of Systems: Negative except as noted in the HPI. Denies N/V/F/Ch.   Objective:   There were no vitals filed for this visit.  There is no height or weight on file to calculate BMI. Constitutional Well developed. Well nourished.  Vascular Foot warm and well perfused. Capillary refill normal to all digits.   Neurologic Normal speech. Oriented to person, place, and time. Epicritic sensation to light touch grossly present bilaterally.  Dermatologic  amputation site remains open ulceration no signs of infection currently remained stable  Orthopedic:  Minimal tenderness to palpation noted about the surgical site.      ABI 0.7 Assessment:   1. Ulcer of left foot with fat layer exposed ([place])   2. PAD (peripheral artery disease) ([place])         Plan:  Patient was evaluated and treated and all questions answered.  Ulcer left foot -Not debride the wound today it remains stable and does not have any active signs of acute infection.  I discussed with the patient and his daughter separately that unfortunately he does not have great options to revascularize the foot.  I think any further major operative debridement on the wound will continue to not heal.  He is meeting with vascular surgery next week to discuss his options including amputation.  Return to see me as needed if he prefers to avoid amputation but I think this likely is the best option for him to get rid of his ulceration that has been present for nearly 6 months now and be fitted for a prosthetic.  Continue wound care is likely not to work at this  point  Return if symptoms worsen or fail to improve.

## 2021-03-10 ENCOUNTER — Other Ambulatory Visit: Payer: Self-pay

## 2021-03-10 ENCOUNTER — Ambulatory Visit (INDEPENDENT_AMBULATORY_CARE_PROVIDER_SITE_OTHER): Payer: Self-pay | Admitting: Adult Health

## 2021-03-10 NOTE — Progress Notes (Deleted)
[facility] [address] [place]. [place] [address] [phone]       STROKE FOLLOW UP NOTE  [name] Date of Birth:  01-03-1936 Medical Record Number:  [id number]   Reason for Referral: stroke follow up    SUBJECTIVE:   CHIEF COMPLAINT:  No chief complaint on file.   HPI:   Today, 03/10/2021, Micheal Walls returns for 27-month stroke follow-up.  Overall stable from stroke standpoint.  Denies new stroke/TIA symptoms.  Compliant on aspirin and Crestor without side effects.  Blood pressure today ***.  Glucose levels ***.   He underwent abdominal aortogram 02/26/2021 by Dr. Stanford Breed for nonhealing left great toe wound and unfortunately no further options for revascularization and will likely require BKA. Has f/u visit tomorrow with Dr. Scot Dock to further discuss.      History provided for reference purposes only Initial visit 09/01/2020 JM: Micheal Walls is being seen for hospital follow-up unaccompanied  Stable from stroke standpoint -denies new or residual stroke/TIA symptoms Compliant on aspirin and Crestor -denies side effects Blood pressure today 119/52 - monitors at home which has been stable Glucose levels stable per pt report - this AM BG 120  Since discharge, returned to ED 2/27 for progressive LLE edema with severe sepsis and left great toe osteomyelitis.  S/p abdominal arteriogram with balloon angioplasty LLE anterior tibialis by Dr. Trula Slade 3/1.  Followed by ID for antibiotic regimen.  Advise follow-up with ID and vascular surgery  Has scheduled f/u with [facility] Friday Continues to follow with ID and reports completing antibiotic regimen yesterday Previously nonweightbearing on LLE with restrictions lifted yesterday and has HH PT coming to his home today  At baseline, able to ambulate short distances with rolling walker but otherwise he is primarily nonambulatory which is chronic and uses motorized wheelchair  No concerns at this  time  Stroke admission 07/18/2020 Micheal Walls is a 85 y.o. male with history of diabetes mellitus with peripheral vascular disease status post Right BKA, diastolic CHF  who presented on 07/18/2020 with generalized weakness, chills, fever, and acute on chronic kidney disease.     Personally reviewed hospitalization pertinent progress notes, lab work and imaging with summary provided.  Evaluated by Dr. Erlinda Hong with stroke work-up revealing incidental right frontal lobe subacute infarct secondary to small vessel disease.  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  MRA possible right VA occlusion or high-grade stenosis.  Carotid Doppler no significant stenosis.  Recommend DAPT for 3 weeks and aspirin alone.  HTN stable.  LDL 43 on Crestor MWF.  Controlled DM A1c 6.1.  Other stroke risk factors include AKI on CKD stage IV, advanced age, former tobacco use, obesity, prior stroke history, OSA on CPAP, diastolic CHF and PVD s/p R BKA.  Other active problems include gout, thrombocytopenia, R BBB and diabetic left great toe infection treated with ceftriaxone and Flagyl.  Evaluated by therapies who recommended [facility] PT/OT  Stroke: incidental right frontal lobe subacute infarct secondary to small vessel disease source CT head right > left subacute cerebellar infarcts.  MRI subacute punctate infarct of right frontal lobe, vs. T2 shine through per my read (per Dr. Erlinda Hong). Old bilateral cerebellar and pontine infarct. MRA right vertebral artery not seen possible occlusion or high-grade stenosis.  Carotid Doppler no significant stenosis 2D Echo EF 55-60%, no SOE LDL 43 HgbA1c 6.1 VTE prophylaxis - Lovenox $RemoveB'40mg'OlOGfcjx$  daily aspirin 81 mg daily prior to admission, now on aspirin 81 mg daily  and clopidogrel 75 mg daily. DAPT for 21 days then Aspirin alone Therapy recommendations: home health OT and PT Disposition: home      ROS:   14 system review of systems performed and negative with exception of  those listed in HPI  PMH:  Past Medical History:  Diagnosis Date   Acute metabolic encephalopathy 0/07/7739   Amputated below knee ([place])    right   Anemia    Aspiration pneumonia ([place]) 2/87/8676   Diastolic heart failure ([facility])    Diverticulosis    DM (diabetes mellitus) ([place])    Gout    Hyperlipemia    OSA on CPAP    RBBB    Sepsis ([place]) 11/02/2019   Systemic hypertension     PSH:  Past Surgical History:  Procedure Laterality Date   ABDOMINAL AORTOGRAM W/LOWER EXTREMITY Left 08/11/2020   Procedure: ABDOMINAL AORTOGRAM W/LOWER EXTREMITY;  Surgeon: Serafina Mitchell, MD;  Location: [facility];  Service: Cardiovascular;  Laterality: Left;   ABDOMINAL AORTOGRAM W/LOWER EXTREMITY N/A 02/26/2021   Procedure: ABDOMINAL AORTOGRAM W/LOWER EXTREMITY;  Surgeon: Cherre Robins, MD;  Location: [facility];  Service: Cardiovascular;  Laterality: N/A;   AMPUTATION Left 09/14/2020   Procedure: AMPUTATION LEFT GREAT TOE;  Surgeon: [name], DPM;  Location: [facility];  Service: Podiatry;  Laterality: Left;   LEG AMPUTATION BELOW KNEE     right   NM MYOCAR PERF WALL MOTION  09/18/2009   no ischemia   PERIPHERAL VASCULAR BALLOON ANGIOPLASTY Left 08/11/2020   Procedure: PERIPHERAL VASCULAR BALLOON ANGIOPLASTY;  Surgeon: Serafina Mitchell, MD;  Location: [facility];  Service: Cardiovascular;  Laterality: Left;  AT   PERIPHERAL VASCULAR INTERVENTION Left 02/26/2021   Procedure: PERIPHERAL VASCULAR INTERVENTION;  Surgeon: Cherre Robins, MD;  Location: [facility];  Service: Cardiovascular;  Laterality: Left;    Social History:  Social History   Socioeconomic History   Marital status: Married    Spouse name: Not on file   Number of children: Not on file   Years of education: Not on file   Highest education level: Not on file  Occupational History   Occupation: retired  Tobacco Use   Smoking status: Former    Types: Cigars    Quit date: 06/12/2001    Years since  quitting: 19.7   Smokeless tobacco: Never  Vaping Use   Vaping Use: Former  Substance and Sexual Activity   Alcohol use: No    Alcohol/week: 0.0 standard drinks   Drug use: No   Sexual activity: Not Currently  Other Topics Concern   Not on file  Social History Narrative   Not on file   Social Determinants of Health   Financial Resource Strain: Low Risk    Difficulty of Paying Living Expenses: Not hard at all  Food Insecurity: No Food Insecurity   Worried About Charity fundraiser in the Last Year: Never true   [place] in the Last Year: Never true  Transportation Needs: No Transportation Needs   Lack of Transportation (Medical): No   Lack of Transportation (Non-Medical): No  Physical Activity: Inactive   Days of Exercise per Week: 0 days   Minutes of Exercise per Session: 0 min  Stress: No Stress Concern Present   Feeling of Stress : Not at all  Social Connections: Not on file  Intimate Partner Violence: Not on file    Family History:  Family History  Problem Relation Age of Onset   Diabetes Mother    Heart attack Father    Cancer Sister     Medications:   Current Outpatient Medications on File Prior to Visit  Medication Sig Dispense Refill   allopurinol (ZYLOPRIM) 100 MG tablet Take 100 mg by mouth 2 (two) times daily.     aspirin EC 81 MG tablet Take 81 mg by mouth daily. Swallow whole.     colchicine 0.6 MG tablet Take 0.6 mg by mouth once a week.     Continuous Blood Gluc Sensor (FREESTYLE LIBRE 14 DAY SENSOR) MISC Use as directed to check blood sugars 6 each 1   diltiazem (CARDIZEM CD) 240 MG 24 hr capsule Take 1 capsule (240 mg total) by mouth daily. 90 capsule 3   dorzolamide (TRUSOPT) 2 % ophthalmic solution Place 1 drop into both eyes 2 (two) times daily.     ferrous sulfate 325 (65 FE) MG tablet Take 325 mg by mouth 2 (two) times daily with a meal.     furosemide (LASIX) 40 MG tablet Take 2 tablets in the morning and one in the evening 180 tablet 2    gabapentin (NEURONTIN) 100 MG capsule Take 2 capsules in am 1 capsule mid day and 1 capsule in evening (Patient taking differently: Take 100-200 mg by mouth See admin instructions. Take 200 mg in am 100 mg evening) 270 capsule 0   gentamicin cream (GARAMYCIN) 0.1 % Apply 1 application topically daily. To foot wound with dressing changes 30 g 1   glucose blood (FREESTYLE PRECISION NEO TEST) test strip Use as instructed 100 each 12   ketotifen (ZADITOR) 0.025 % ophthalmic solution Place 1 drop into both eyes 2 (two) times daily.     latanoprost (XALATAN) 0.005 % ophthalmic solution Place 1 drop into both eyes at bedtime.      Multiple Vitamins-Minerals (MULTIVITAMIN WITH MINERALS) tablet Take 1 tablet by mouth daily. Centrum men 50+     Multiple Vitamins-Minerals (PRESERVISION AREDS) CAPS Take 1 capsule by mouth 2 (two) times daily.     pantoprazole (PROTONIX) 40 MG tablet Take 1 tablet (40 mg total) by mouth daily. 90 tablet 1   Polyvinyl Alcohol-Povidone PF (REFRESH) 1.4-0.6 % SOLN Place 1 drop into both eyes 2 (two) times daily.     rosuvastatin (CRESTOR) 10 MG tablet Take 1 tablet (10 mg total) by mouth daily. 90 tablet 3   Semaglutide, [name], (OZEMPIC, [name],) 2 MG/1.5ML SOPN Inject 1 mg into the skin once a week. 9 mL 1   senna-docusate (SENOKOT-S) 8.6-50 MG tablet Take 1 tablet by mouth 2 (two) times daily between meals as needed for mild constipation or moderate constipation. (Patient taking differently: Take 1 tablet by mouth 2 (two) times daily.) 60 tablet 0   tamsulosin (FLOMAX) 0.4 MG CAPS capsule Take 0.4 mg by mouth daily.     No current facility-administered medications on file prior to visit.    Allergies:   Allergies  Allergen Reactions   Vancomycin Rash      OBJECTIVE:  Physical Exam  There were no vitals filed for this visit.  There is no height or weight on file to calculate BMI. No results found.  General: well developed, well nourished,  very pleasant  elderly African-American male, seated in motorized wheelchair, in no evident distress Head: head normocephalic and atraumatic.   Neck: supple with no carotid or supraclavicular bruits Cardiovascular: regular rate and rhythm, no murmurs Musculoskeletal: R BKA with  prosthetic in place Skin:  left foot in walking shoe and wrapped in gauze  Vascular:  Normal pulses all extremities   Neurologic Exam Mental Status: Awake and fully alert.   Fluent speech and language.  Oriented to place and time. Recent and remote memory intact. Attention span, concentration and fund of knowledge appropriate. Mood and affect appropriate.  Cranial Nerves: Pupils equal, briskly reactive to light. Extraocular movements full without nystagmus. Visual fields full to confrontation. Hearing intact. Facial sensation intact. Face, tongue, palate moves normally and symmetrically.  Motor: Normal bulk and tone. Normal strength in all tested extremity muscles (R BKA) Sensory.: intact to touch , pinprick , position and vibratory sensation.  Coordination: Rapid alternating movements normal in all extremities. Finger-to-nose performed accurately bilaterally  Gait and Station: Deferred Reflexes: 1+ and symmetric. Toes downgoing.        ASSESSMENT: ABSHIR PAOLINI is a 85 y.o. year old male presented to [facility] ED with generalized weakness, chills, fever and acute on chronic kidney disease on 07/18/2020 like in setting of sepsis secondary to diabetic left foot infection.  Transferred to [facility] ED for evidence of incidental right frontal lobe subacute infarct secondary to small vessel disease. Vascular risk factors include HTN, HLD, DM, PVD s/p R BKA, diastolic CHF, R VA stenosis/occlusion, AKI on CKD stage IV, prior stroke history (b/l cerebellar and pontine) and OSA on CPAP.      PLAN:  R frontal lobe stroke, prior stroke hx:  Chronic gait impairment from prior strokes.  Recent frontal stroke likely incidental finding and no  residual deficits. Continue aspirin 81 mg daily  and Crestor 10 mg MWF for secondary stroke prevention.   Discussed secondary stroke prevention measures and importance of close PCP follow up for aggressive stroke risk factor management  HTN: BP goal <130/90.  Stable on current regimen per PCP HLD: LDL goal <70. Recent LDL 43 on Crestor MWF per PCP.  DMII: A1c goal<7.0. Recent A1c 6.1. on Ozempic per PCP Osteomyelitis, left great toe: s/p abdominal aortogram with balloon angioplasty LLE anterior tibialis 08/11/2020.  Follows routinely with ID and [facility]    Follow up in 6 months or call earlier if needed   CC:  [facility] provider: Dr. Leonie Man PCP: [name], [name]    I spent 45 minutes of face-to-face and non-face-to-face time with patient.  This included previsit chart review, lab work and imaging, lab review, study review, order entry, electronic health record documentation, patient education regarding stroke and history of prior strokes with residual deficits, importance of managing stroke risk factors, short discussion of recent admission for osteomyelitis and sepsis and follow-up with ID and [facility] and answered all other questions to patient satisfaction  Micheal Walls, AGNP-BC  [facility] [address] [place], [place] [address]  Phone [phone] Fax [phone] Note: This document was prepared with digital dictation and possible smart phrase technology. Any transcriptional errors that result from this process are unintentional.

## 2021-03-11 ENCOUNTER — Encounter: Payer: Self-pay | Admitting: Vascular Surgery

## 2021-03-11 ENCOUNTER — Other Ambulatory Visit: Payer: Self-pay

## 2021-03-11 ENCOUNTER — Ambulatory Visit (INDEPENDENT_AMBULATORY_CARE_PROVIDER_SITE_OTHER): Payer: Medicare Other | Admitting: Vascular Surgery

## 2021-03-11 VITALS — BP 143/80 | HR 83 | Temp 98.2°F | Resp 20 | Ht 73.0 in | Wt 250.0 lb

## 2021-03-11 DIAGNOSIS — I6330 Cerebral infarction due to thrombosis of unspecified cerebral artery: Secondary | ICD-10-CM

## 2021-03-11 DIAGNOSIS — I872 Venous insufficiency (chronic) (peripheral): Secondary | ICD-10-CM | POA: Diagnosis not present

## 2021-03-11 DIAGNOSIS — I739 Peripheral vascular disease, unspecified: Secondary | ICD-10-CM

## 2021-03-11 NOTE — Progress Notes (Signed)
Patient name: Micheal Walls MRN: [id number] DOB: July 28, 1935 Sex: male  REASON FOR VISIT:   Follow-up of combined chronic venous insufficiency and peripheral arterial disease.  HPI:   Micheal Walls is a pleasant 85 y.o. male who I seen in consultation on 02/18/2021 with combined peripheral arterial disease and chronic venous insufficiency.  Patient had a nonhealing wound of the left great toe.  The patient was set up for an arteriogram. The patient underwent an arteriogram on 02/26/2021 by Dr. Stanford Breed.  The patient had severe tibial artery occlusive disease with no options for revascularization.  It was felt that he would likely need a below the knee amputation.  Since he was seen last he has been doing dressing changes to the left great toe and this has remained stable.  There is been no erythema or drainage.  He has had no fever.  He still has significant left lower extremity swelling.  Current Outpatient Medications  Medication Sig Dispense Refill   allopurinol (ZYLOPRIM) 100 MG tablet Take 100 mg by mouth 2 (two) times daily.     aspirin EC 81 MG tablet Take 81 mg by mouth daily. Swallow whole.     colchicine 0.6 MG tablet Take 0.6 mg by mouth once a week.     Continuous Blood Gluc Sensor (FREESTYLE LIBRE 14 DAY SENSOR) MISC Use as directed to check blood sugars 6 each 1   diltiazem (CARDIZEM CD) 240 MG 24 hr capsule Take 1 capsule (240 mg total) by mouth daily. 90 capsule 3   dorzolamide (TRUSOPT) 2 % ophthalmic solution Place 1 drop into both eyes 2 (two) times daily.     ferrous sulfate 325 (65 FE) MG tablet Take 325 mg by mouth 2 (two) times daily with a meal.     furosemide (LASIX) 40 MG tablet Take 2 tablets in the morning and one in the evening 180 tablet 2   gabapentin (NEURONTIN) 100 MG capsule Take 2 capsules in am 1 capsule mid day and 1 capsule in evening (Patient taking differently: Take 100-200 mg by mouth See admin instructions. Take 200 mg in am 100 mg evening) 270  capsule 0   gentamicin cream (GARAMYCIN) 0.1 % Apply 1 application topically daily. To foot wound with dressing changes 30 g 1   glucose blood (FREESTYLE PRECISION NEO TEST) test strip Use as instructed 100 each 12   ketotifen (ZADITOR) 0.025 % ophthalmic solution Place 1 drop into both eyes 2 (two) times daily.     latanoprost (XALATAN) 0.005 % ophthalmic solution Place 1 drop into both eyes at bedtime.      Multiple Vitamins-Minerals (MULTIVITAMIN WITH MINERALS) tablet Take 1 tablet by mouth daily. Centrum men 50+     Multiple Vitamins-Minerals (PRESERVISION AREDS) CAPS Take 1 capsule by mouth 2 (two) times daily.     pantoprazole (PROTONIX) 40 MG tablet Take 1 tablet (40 mg total) by mouth daily. 90 tablet 1   Polyvinyl Alcohol-Povidone PF (REFRESH) 1.4-0.6 % SOLN Place 1 drop into both eyes 2 (two) times daily.     rosuvastatin (CRESTOR) 10 MG tablet Take 1 tablet (10 mg total) by mouth daily. 90 tablet 3   Semaglutide, [name], (OZEMPIC, [name],) 2 MG/1.5ML SOPN Inject 1 mg into the skin once a week. 9 mL 1   senna-docusate (SENOKOT-S) 8.6-50 MG tablet Take 1 tablet by mouth 2 (two) times daily between meals as needed for mild constipation or moderate constipation. (Patient taking differently: Take 1 tablet by  mouth 2 (two) times daily.) 60 tablet 0   tamsulosin (FLOMAX) 0.4 MG CAPS capsule Take 0.4 mg by mouth daily.     No current facility-administered medications for this visit.    REVIEW OF SYSTEMS:  $RemoveB'[X]'EIWtLThZ$  denotes positive finding, $RemoveBeforeDEI'[ ]'WsqcStVzLSEyIiso$  denotes negative finding Vascular    Leg swelling    Cardiac    Chest pain or chest pressure:    Shortness of breath upon exertion:    Short of breath when lying flat:    Irregular heart rhythm:    Constitutional    Fever or chills:     PHYSICAL EXAM:   Vitals:   03/11/21 1551  BP: (!) 143/80  Pulse: 83  Resp: 20  Temp: 98.2 F (36.8 C)  SpO2: 97%  Weight: 250 lb (113.4 kg)  Height: $Remove'6\' 1"'LhevvKO$  (1.854 m)    GENERAL: The patient is a  well-nourished male, in no acute distress. The vital signs are documented above. CARDIOVASCULAR: There is a regular rate and rhythm. PULMONARY: There is good air exchange bilaterally without wheezing or rales. VASCULAR: He does have a monophasic but fairly brisk dorsalis pedis signal with the Doppler.  Because of his extensive leg swelling I cannot hear a peroneal or posterior tibial signal.  He has significant left lower extremity swelling. The wound on the left great toe is stable in size as documented in the photograph below.      DATA:   I reviewed his arteriogram.  He had inline flow down to the tibials and then severe tibial artery occlusive disease with no options for revascularization.  MEDICAL ISSUES:   COMBINED PERIPHERAL ARTERIAL DISEASE AND CHRONIC VENOUS INSUFFICIENCY: The wound is stable in size.  Given his severe tibial disease I am concerned that this will not heal and that ultimately he will require an amputation.  He does have a prosthesis for his right below the knee amputation but tells me that he is nonambulatory.  Thus if he agrees to amputation it would probably be best to do an above-the-knee amputation and give him the best chance of healing.  I explained that the real indication to try to save the knees if he plans on getting bilateral prostheses and ambulating.  Currently the wound does not appear to be a source of sepsis.  He would like to get through the holidays before considering amputation.  I will plan on seeing him back in January.  He knows to call sooner if he has problems.  Deitra Mayo [facility] [phone]

## 2021-03-15 ENCOUNTER — Ambulatory Visit: Payer: Medicare Other | Admitting: Adult Health

## 2021-03-15 ENCOUNTER — Telehealth: Payer: Medicare Other | Admitting: Adult Health

## 2021-03-15 ENCOUNTER — Telehealth: Payer: Medicare Other

## 2021-03-16 ENCOUNTER — Ambulatory Visit (INDEPENDENT_AMBULATORY_CARE_PROVIDER_SITE_OTHER): Payer: Medicare Other

## 2021-03-16 ENCOUNTER — Telehealth: Payer: Medicare Other

## 2021-03-16 DIAGNOSIS — I739 Peripheral vascular disease, unspecified: Secondary | ICD-10-CM

## 2021-03-16 DIAGNOSIS — Z794 Long term (current) use of insulin: Secondary | ICD-10-CM

## 2021-03-16 DIAGNOSIS — N184 Chronic kidney disease, stage 4 (severe): Secondary | ICD-10-CM

## 2021-03-16 DIAGNOSIS — G629 Polyneuropathy, unspecified: Secondary | ICD-10-CM

## 2021-03-16 DIAGNOSIS — E782 Mixed hyperlipidemia: Secondary | ICD-10-CM

## 2021-03-17 ENCOUNTER — Telehealth: Payer: Self-pay

## 2021-03-17 ENCOUNTER — Other Ambulatory Visit: Payer: Self-pay

## 2021-03-17 DIAGNOSIS — I739 Peripheral vascular disease, unspecified: Secondary | ICD-10-CM

## 2021-03-17 NOTE — Telephone Encounter (Signed)
Patient's daughter sent a message about a lump in the patient's right groin. He is s/p aortogram on 9/16. I called the patient, and he endorses a plum size lump in his right groin, that isn't painful, but "very sore." He denies any open areas, redness or warmth at site and is not running chills or fever. Says the lump has been present for a few days, and may be getting bigger but is not getting bigger quickly. Placed patient on the schedule tomorrow for a right pseudoaneurysm study and evaluation.

## 2021-03-18 ENCOUNTER — Ambulatory Visit (INDEPENDENT_AMBULATORY_CARE_PROVIDER_SITE_OTHER): Payer: Self-pay | Admitting: Physician Assistant

## 2021-03-18 ENCOUNTER — Ambulatory Visit (HOSPITAL_COMMUNITY)
Admission: RE | Admit: 2021-03-18 | Discharge: 2021-03-18 | Disposition: A | Discharge status: Home / Self Care | Payer: Medicare Other | Source: Ambulatory Visit | Attending: Vascular Surgery | Admitting: Vascular Surgery

## 2021-03-18 ENCOUNTER — Encounter: Payer: Self-pay | Admitting: Physician Assistant

## 2021-03-18 ENCOUNTER — Other Ambulatory Visit: Payer: Self-pay

## 2021-03-18 VITALS — BP 140/67 | HR 67 | Temp 97.8°F | Resp 18 | Ht 73.0 in | Wt 250.0 lb

## 2021-03-18 DIAGNOSIS — I739 Peripheral vascular disease, unspecified: Secondary | ICD-10-CM | POA: Insufficient documentation

## 2021-03-18 NOTE — Progress Notes (Signed)
POST OPERATIVE OFFICE NOTE    CC:  F/u for surgery  HPI:  This is a 85 y.o. male who is s/p angiogram  on 02/26/21 by Dr. Stanford Breed.    Pt returns today for follow up.  Pt states with a chief complaint of swelling in the right groin surrounding the stick site.  He is here for duplex to r/o pseudoaneurysm.   Allergies  Allergen Reactions   Vancomycin Rash    Current Outpatient Medications  Medication Sig Dispense Refill   allopurinol (ZYLOPRIM) 100 MG tablet Take 100 mg by mouth 2 (two) times daily.     aspirin EC 81 MG tablet Take 81 mg by mouth daily. Swallow whole.     colchicine 0.6 MG tablet Take 0.6 mg by mouth once a week.     Continuous Blood Gluc Sensor (FREESTYLE LIBRE 14 DAY SENSOR) MISC Use as directed to check blood sugars 6 each 1   diltiazem (CARDIZEM CD) 240 MG 24 hr capsule Take 1 capsule (240 mg total) by mouth daily. 90 capsule 3   dorzolamide (TRUSOPT) 2 % ophthalmic solution Place 1 drop into both eyes 2 (two) times daily.     ferrous sulfate 325 (65 FE) MG tablet Take 325 mg by mouth 2 (two) times daily with a meal.     furosemide (LASIX) 40 MG tablet Take 2 tablets in the morning and one in the evening 180 tablet 2   gabapentin (NEURONTIN) 100 MG capsule Take 2 capsules in am 1 capsule mid day and 1 capsule in evening (Patient taking differently: Take 100-200 mg by mouth See admin instructions. Take 200 mg in am 100 mg evening) 270 capsule 0   gentamicin cream (GARAMYCIN) 0.1 % Apply 1 application topically daily. To foot wound with dressing changes 30 g 1   glucose blood (FREESTYLE PRECISION NEO TEST) test strip Use as instructed 100 each 12   ketotifen (ZADITOR) 0.025 % ophthalmic solution Place 1 drop into both eyes 2 (two) times daily.     latanoprost (XALATAN) 0.005 % ophthalmic solution Place 1 drop into both eyes at bedtime.      Multiple Vitamins-Minerals (MULTIVITAMIN WITH MINERALS) tablet Take 1 tablet by mouth daily. Centrum men 50+     Multiple  Vitamins-Minerals (PRESERVISION AREDS) CAPS Take 1 capsule by mouth 2 (two) times daily.     pantoprazole (PROTONIX) 40 MG tablet Take 1 tablet (40 mg total) by mouth daily. 90 tablet 1   Polyvinyl Alcohol-Povidone PF (REFRESH) 1.4-0.6 % SOLN Place 1 drop into both eyes 2 (two) times daily.     rosuvastatin (CRESTOR) 10 MG tablet Take 1 tablet (10 mg total) by mouth daily. 90 tablet 3   Semaglutide, [name], (OZEMPIC, [name],) 2 MG/1.5ML SOPN Inject 1 mg into the skin once a week. 9 mL 1   senna-docusate (SENOKOT-S) 8.6-50 MG tablet Take 1 tablet by mouth 2 (two) times daily between meals as needed for mild constipation or moderate constipation. (Patient taking differently: Take 1 tablet by mouth 2 (two) times daily.) 60 tablet 0   tamsulosin (FLOMAX) 0.4 MG CAPS capsule Take 0.4 mg by mouth daily.     No current facility-administered medications for this visit.     ROS:  See HPI  Physical Exam:  +------------+----------+--------+------+----------+  Right DuplexPSV (cm/s)WaveformPlaqueComment(s)  +------------+----------+--------+------+----------+  CFA            125    biphasic                  +------------+----------+--------+------+----------+  Prox SFA       102    biphasic                  +------------+----------+--------+------+----------+   Right Vein comments:Right common femoral and proximal femoral veins are  patent and compressible with no evidence of thrombosis.     Summary: No evidence of pseudoaneurysm, AVF or DVT      Incision:  right groin soft without evidence of pseudoaneurysm. Extremities:  Gt toe wound stable no purulent drainage    Assessment/Plan:  This is a 85 y.o. male who is s/p: angiogram without revascularization options.  Left lower extremity: Common femoral artery: widely patent  Profunda femoris artery: widely patent   Superficial femoral artery: widely patent  Popliteal artery: widely patent  Anterior tibial artery:  patient at the origin. Occludes distal third of calf. Reconstitutes about the ankle Tibioperoneal trunk: patent Peroneal artery: patent to mid calf. Tapers to occlusion above the ankle. Posterior tibial artery: occluded at the origin. Reconstitutes about the ankle Pedal circulation: disadvantaged. Fills via collaterals to DP.   He knows he has no revascularization options.  He will require BKA verse AKA.  Of note  he has a right below the knee amputation.  He wishes to wait and discuss his options with Dr. Scot Dock.  He is non ambulatory and uses a power WC for mobility.  He has a future appointment that he will keep.  If he develops symptoms of worsening infection he will call sooner.    Roxy Horseman PA-C [facility] [phone]   Clinic MD:  Scot Dock

## 2021-03-23 ENCOUNTER — Other Ambulatory Visit: Payer: Self-pay

## 2021-03-23 ENCOUNTER — Ambulatory Visit (INDEPENDENT_AMBULATORY_CARE_PROVIDER_SITE_OTHER): Payer: Medicare Other | Admitting: Podiatry

## 2021-03-23 ENCOUNTER — Encounter: Payer: Self-pay | Admitting: Podiatry

## 2021-03-23 DIAGNOSIS — L97522 Non-pressure chronic ulcer of other part of left foot with fat layer exposed: Secondary | ICD-10-CM | POA: Diagnosis not present

## 2021-03-23 NOTE — Progress Notes (Signed)
  Subjective:  Patient ID: Micheal Walls, male    DOB: [birth year],  MRN: [id number]  Chief Complaint  Patient presents with   Follow-up    Ulcer of left foot     DOS: 09/14/2020 Procedure: Left hallux amputation  85 y.o. male returns for post-op check.  Returns for follow-up for ongoing wound care, they are waiting off on amputation for now  Review of Systems: Negative except as noted in the HPI. Denies N/V/F/Ch.   Objective:   There were no vitals filed for this visit.  There is no height or weight on file to calculate BMI. Constitutional Well developed. Well nourished.  Vascular Foot warm and well perfused. Capillary refill normal to all digits.   Neurologic Normal speech. Oriented to person, place, and time. Epicritic sensation to light touch grossly present bilaterally.  Dermatologic Open ulcer of left foot at amputation site with exposed subcutaneous tissue measuring 1.0x0.8 x 0.2 cm, significant improvement since last time I saw him with hyperkeratotic tissue surrounding  Orthopedic:  Minimal tenderness to palpation noted about the surgical site.       ABI 0.7 Assessment:   1. Ulcer of left foot with fat layer exposed ([place])         Plan:  Patient was evaluated and treated and all questions answered.  Ulcer left foot -Remarkably he has had actually quite a bit of improvement since I saw him last time despite his poor vascular flow.  I think this point we should hold off on amputation and see how long ongoing wound care can see if we can improve this.  Continue gentamicin at home daily dressing changes and he may begin bathing with antibacterial soap gently. -Debridement as below. -Dressed with Iodosorb, DSD. -Continue off-loading with surgical shoe.  Procedure: Excisional Debridement of Wound Rationale: Removal of non-viable soft tissue from the wound to promote healing.  Anesthesia: none Post-Debridement Wound Measurements: 1.0 cm x 0.8 cm x 0.2 cm   Type of Debridement: Sharp Excisional Tissue Removed: Non-viable soft tissue Depth of Debridement: subcutaneous tissue. Technique: Sharp excisional debridement to bleeding, viable wound base.  Dressing: Dry, sterile, compression dressing. Disposition: Patient tolerated procedure well. Patient to return in 1 week for follow-up.  Return in about 3 weeks (around 04/13/2021) for wound care.       Return in about 3 weeks (around 04/13/2021) for wound care.

## 2021-03-30 ENCOUNTER — Telehealth: Payer: Self-pay

## 2021-03-30 ENCOUNTER — Other Ambulatory Visit: Payer: Self-pay

## 2021-03-30 ENCOUNTER — Other Ambulatory Visit: Payer: Medicare Other

## 2021-03-30 DIAGNOSIS — E039 Hypothyroidism, unspecified: Secondary | ICD-10-CM

## 2021-03-30 DIAGNOSIS — E079 Disorder of thyroid, unspecified: Secondary | ICD-10-CM

## 2021-03-30 DIAGNOSIS — R7989 Other specified abnormal findings of blood chemistry: Secondary | ICD-10-CM

## 2021-03-30 NOTE — Chronic Care Management (AMB) (Signed)
  Micheal Walls was reminded to have all medications, supplements and any blood glucose and blood pressure readings available for review with [name], Pharm. D, at his telephone visit on 03-31-2021 at 11:00. Left Voicemail with daughter also.   Questions: Have you had any recent office visit or specialist visit outside of [facility]? Patient stated no  Are there any concerns you would like to discuss during your office visit? Patient stated no  Are you having any problems obtaining your medications? (Whether it pharmacy issues or cost) Patient stated no  If patient has any PAP medications ask if they are having any problems getting their PAP medication or refill? No PAP medications  Care Gaps: Shingrix overdue Covid booster overdue Last completed 04-03-2020 AWV 05-20-2021 Flu vaccine overdue last completed 05-20-2020  Star Rating Drug: Ozempic 1 mg- Last filled 01-21-2021 for 90 DS [facility] mail order Rosuvastatin 10 mg- Last filled 01-19-2021 [facility]  Any gaps in medications fill history? No  [place] Pharmacist Assistant [phone]

## 2021-03-31 ENCOUNTER — Telehealth: Payer: Medicare Other

## 2021-03-31 LAB — T4, FREE: Free T4: 0.95 ng/dL (ref 0.82–1.77)

## 2021-03-31 LAB — T3, FREE: T3, Free: 1.9 pg/mL — ABNORMAL LOW (ref 2.0–4.4)

## 2021-03-31 LAB — TSH: TSH: 3.49 u[IU]/mL (ref 0.450–4.500)

## 2021-03-31 NOTE — Progress Notes (Deleted)
Current Barriers:  {pharmacybarriers:24917}  Pharmacist Clinical Goal(s):  Patient will {PHARMACYGOALCHOICES:24921} through collaboration with PharmD and provider.   Interventions: 1:1 collaboration with [name], [name] regarding development and update of comprehensive plan of care as evidenced by provider attestation and co-signature Inter-disciplinary care team collaboration (see longitudinal plan of care) Comprehensive medication review performed; medication list updated in electronic medical record  {CCM [facility] DISEASE STATES:25130}  Patient Goals/Self-Care Activities Patient will:  - {pharmacypatientgoals:24919}  Follow Up Plan: {CM FOLLOW UP DGUY:40347}   Chronic Care Management Pharmacy Note  03/31/2021 Name:  Micheal Walls MRN:  [id number] DOB:  1936-02-24  Summary: ***  Recommendations/Changes made from today's visit: ***  Plan: ***   Subjective: Micheal Walls is an 85 y.o. year old male who is a primary patient of [name], [place].  The CCM team was consulted for assistance with disease management and care coordination needs.    {CCMTELEPHONEFACETOFACE:21091510} for {CCMINITIALFOLLOWUPCHOICE:21091511} in response to provider referral for pharmacy case management and/or care coordination services.   Consent to Services:  {CCMCONSENTOPTIONS:25074}  Patient Care Team: [name], [name] as PCP - General (General Practice) [name], [name], MD as PCP - Cardiology (Cardiology) Rex Kras, Claudette Stapler, RN as [place] as [place], Sharyn Blitz, [facility] (Pharmacist)  Recent office visits: ***  Recent consult visits: [facility] visits: {Hospital DC Yes/No:25215}   Objective:  Lab Results  Component Value Date   CREATININE 2.40 (H) 02/26/2021   BUN 59 (H) 02/26/2021   GFRNONAA 35 (L) 10/24/2020   GFRAA 42 (L) 05/20/2020   NA 140 02/26/2021   K 3.6 02/26/2021    CALCIUM 10.2 02/04/2021   CO2 19 (L) 02/04/2021   GLUCOSE 136 (H) 02/26/2021    Lab Results  Component Value Date/Time   HGBA1C 6.5 (H) 02/04/2021 09:50 AM   HGBA1C 6.3 (H) 10/29/2020 11:56 AM   MICROALBUR 30 09/28/2020 03:58 PM   MICROALBUR 80 05/15/2019 10:28 AM    Last diabetic Eye exam:  Lab Results  Component Value Date/Time   HMDIABEYEEXA Retinopathy (A) 07/10/2020 12:00 AM    Last diabetic Foot exam: No results found for: HMDIABFOOTEX   Lab Results  Component Value Date   CHOL 154 10/29/2020   HDL 29 (L) 10/29/2020   LDLCALC 102 (H) 10/29/2020   TRIG 129 10/29/2020   CHOLHDL 5.3 (H) 10/29/2020    Hepatic Function Latest Ref Rng & Units 10/29/2020 10/21/2020 09/28/2020  Total Protein 6.0 - 8.5 g/dL 7.2 7.6 7.6  Albumin 3.6 - 4.6 g/dL 3.3(L) 3.3(L) 3.9  AST 0 - 40 IU/L 22 21 22   ALT 0 - 44 IU/L 29 22 27   Alk Phosphatase 44 - 121 IU/L 82 68 92  Total Bilirubin 0.0 - 1.2 mg/dL 0.3 0.4 0.3  Bilirubin, Direct 0.00 - 0.40 mg/dL - - -    Lab Results  Component Value Date/Time   TSH 3.490 03/30/2021 10:13 AM   TSH 5.130 (H) 02/04/2021 09:50 AM   FREET4 0.95 03/30/2021 10:13 AM    CBC Latest Ref Rng & Units 02/26/2021 10/29/2020 10/24/2020  WBC 3.4 - 10.8 x10E3/uL - 6.4 8.8  Hemoglobin 13.0 - 17.0 g/dL 10.5(L) 9.6(L) 8.1(L)  Hematocrit 39.0 - 52.0 % 31.0(L) 29.6(L) 26.1(L)  Platelets 150 - 450 x10E3/uL - 240 206    No results found for: VD25OH  Clinical ASCVD: {YES/NO:21197} The ASCVD Risk score (Arnett DK, et al., 2019) failed to calculate for the following reasons:  The 2019 ASCVD risk score is only valid for ages 28 to 19   The patient has a prior MI or stroke diagnosis    Depression screen [facility] 2/9 05/20/2020 09/24/2019 06/25/2019  Decreased Interest 0 0 0  Down, Depressed, Hopeless 0 0 0  PHQ - 2 Score 0 0 0  Altered sleeping - - -  Tired, decreased energy - - -  Change in appetite - - -  Feeling bad or failure about yourself  - - -  Trouble concentrating -  - -  Moving slowly or fidgety/restless - - -  Suicidal thoughts - - -  PHQ-9 Score - - -  Difficult doing work/chores - - -  Some recent data might be hidden     ***Other: (CHADS2VASc if Afib, MMRC or CAT for COPD, ACT, DEXA)  Social History   Tobacco Use  Smoking Status Former   Types: Cigars   Quit date: 06/12/2001   Years since quitting: 19.8  Smokeless Tobacco Never   BP Readings from Last 3 Encounters:  03/18/21 140/67  03/11/21 (!) 143/80  02/26/21 (!) 119/53   Pulse Readings from Last 3 Encounters:  03/18/21 67  03/11/21 83  02/26/21 66   Wt Readings from Last 3 Encounters:  03/18/21 250 lb (113.4 kg)  03/11/21 250 lb (113.4 kg)  02/26/21 250 lb (113.4 kg)   BMI Readings from Last 3 Encounters:  03/18/21 32.98 kg/m  03/11/21 32.98 kg/m  02/26/21 32.98 kg/m    Assessment/Interventions: Review of patient past medical history, allergies, medications, health status, including review of consultants reports, laboratory and other test data, was performed as part of comprehensive evaluation and provision of chronic care management services.   SDOH:  (Social Determinants of Health) assessments and interventions performed: {yes/no:20286}  SDOH Screenings   Alcohol Screen: Not on file  Depression (PHQ2-9): Low Risk    PHQ-2 Score: 0  Financial Resource Strain: Low Risk    Difficulty of Paying Living Expenses: Not hard at all  Food Insecurity: No Food Insecurity   Worried About Charity fundraiser in the Last Year: Never true   Ran Out of Food in the Last Year: Never true  Housing: Not on file  Physical Activity: Inactive   Days of Exercise per Week: 0 days   Minutes of Exercise per Session: 0 min  Social Connections: Not on file  Stress: No Stress Concern Present   Feeling of Stress : Not at all  Tobacco Use: Medium Risk   Smoking Tobacco Use: Former   Smokeless Tobacco Use: Never  Transportation Needs: No Transportation Needs   Lack of Transportation  (Medical): No   Lack of Transportation (Non-Medical): No    CCM Care Plan  Allergies  Allergen Reactions   Vancomycin Rash    Medications Reviewed Today     Reviewed by Ulice Brilliant (Pharmacy Technician) on 03/23/21 at [phone]  Med List Status: <None>   Medication Order Taking? Sig Documenting Provider Last Dose Status Informant  allopurinol (ZYLOPRIM) 100 MG tablet [id number] No Take 100 mg by mouth 2 (two) times daily. [provider] Taking Active Self           Med Note Fransico Him Oct 22, 2020  9:51 AM)    aspirin EC 81 MG tablet [id number] No Take 81 mg by mouth daily. Swallow whole. [provider] Taking Active Self  colchicine 0.6 MG tablet [id number] No Take 0.6 mg by mouth once  a week. [provider] Taking Active Self  Continuous Blood Gluc Sensor (FREESTYLE LIBRE 14 DAY SENSOR) [place] [id number] No Use as directed to check blood sugars [name], [name] Taking Active Self  diltiazem (CARDIZEM CD) 240 MG 24 hr capsule [id number] No Take 1 capsule (240 mg total) by mouth daily. [name], [name], MD Taking Active Self  dorzolamide (TRUSOPT) 2 % ophthalmic solution [id number] No Place 1 drop into both eyes 2 (two) times daily. [provider] Taking Active Self  ferrous sulfate 325 (65 FE) MG tablet [id number] No Take 325 mg by mouth 2 (two) times daily with a meal. [provider] Taking Active Self           Med Note Gentry Roch   Tue Feb 23, 2021 10:34 AM)    furosemide (LASIX) 40 MG tablet [id number] No Take 2 tablets in the morning and one in the evening [name], [name] Taking Active Self  gabapentin (NEURONTIN) 100 MG capsule [id number] No Take 2 capsules in am 1 capsule mid day and 1 capsule in evening  Patient taking differently: Take 100-200 mg by mouth See admin instructions. Take 200 mg in am 100 mg evening   [name], [name] Taking Active   gentamicin cream (GARAMYCIN) 0.1 % [id number] No Apply 1  application topically daily. To foot wound with dressing changes [name], DPM Taking Active Self  glucose blood (FREESTYLE PRECISION NEO TEST) test strip [id number] No Use as instructed [name], [name] Taking Active Self  ketotifen (ZADITOR) 0.025 % ophthalmic solution [id number] No Place 1 drop into both eyes 2 (two) times daily. [provider] Taking Active Self  latanoprost (XALATAN) 0.005 % ophthalmic solution [id number] No Place 1 drop into both eyes at bedtime.  [provider] Taking Active Self           Med Note Fransico Him Oct 22, 2020 10:00 AM)    Multiple Vitamins-Minerals (MULTIVITAMIN WITH MINERALS) tablet [id number] No Take 1 tablet by mouth daily. Centrum men 50+ [provider] Taking Active Self  Multiple Vitamins-Minerals (PRESERVISION AREDS) CAPS [id number] No Take 1 capsule by mouth 2 (two) times daily. [provider] Taking Active Self  pantoprazole (PROTONIX) 40 MG tablet [id number] No Take 1 tablet (40 mg total) by mouth daily. [name], [name] Taking Active Self  Polyvinyl Alcohol-Povidone PF (REFRESH) 1.4-0.6 % SOLN [id number] No Place 1 drop into both eyes 2 (two) times daily. [provider] Taking Active Self  rosuvastatin (CRESTOR) 10 MG tablet [id number] No Take 1 tablet (10 mg total) by mouth daily. [name], [name], MD Taking Active Self  Semaglutide, [name], (OZEMPIC, [name],) 2 MG/1.5ML SOPN [id number] No Inject 1 mg into the skin once a week. [name], [name] Taking Active Self  senna-docusate (SENOKOT-S) 8.6-50 MG tablet [id number] No Take 1 tablet by mouth 2 (two) times daily between meals as needed for mild constipation or moderate constipation.  Patient taking differently: Take 1 tablet by mouth 2 (two) times daily.   Mercy Riding, MD Taking Active   tamsulosin (FLOMAX) 0.4 MG CAPS capsule [id number] No Take 0.4 mg by mouth daily. 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 ([place]) 10/21/2020   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[place])    Left ankle swelling    Diabetic foot infection (  [place])    Cerebral thrombosis with cerebral infarction 07/20/2020   Thrombocytopenia ([place]) 07/19/2020   Cerebral ischemia 07/18/2020   Bilateral hearing loss 05/20/2020   Does use hearing aid 05/20/2020   Ulcer of left foot with fat layer exposed ([place]) 01/16/2020   Controlled type 2 diabetes mellitus with stable proliferative retinopathy of both eyes, with long-term current use of insulin ([place]) 01/06/2020   Intermediate stage nonexudative age-related macular degeneration of both eyes 01/06/2020   Left epiretinal membrane 01/06/2020   [place] of right macula 01/06/2020   AKI (acute kidney injury) ([place]) 11/02/2019   Cellulitis 11/02/2019   Elevated TSH 07/10/2018   Nephropathy 02/28/2018   Disturbance of skin sensation 02/28/2018   PAD (peripheral artery disease) ([place]) 07/17/2017   110.1 10/21/2013   Epiphora 04/24/2013   Laxity of eyelid 04/24/2013   Anemia 04/09/2013   History of peripheral vascular disease 04/09/2013   History of stroke 04/09/2013   S/P unilateral BKA (below knee amputation) ([place]) 03/17/2013   Diabetes mellitus type 2 in obese ([place]) 03/17/2013   Mixed hyperlipidemia 03/17/2013   Essential hypertension 03/17/2013   Gout 03/17/2013   CKD (chronic kidney disease), stage III ([place]) 03/17/2013   OSA on CPAP 03/17/2013   Anemia, iron deficiency 03/17/2013   Diverticulosis of colon 03/17/2013   RBBB 03/17/2013   Punctal stenosis, acquired 10/02/2012    Immunization History  Administered Date(s) Administered   Fluad Quad(high Dose 65+) 05/20/2020   Influenza, High Dose Seasonal PF 03/21/2019   PFIZER(Purple Top)SARS-COV-2 Vaccination 08/31/2019, 09/25/2019, 04/03/2020   Pneumococcal Conjugate-13 02/23/2016   Pneumococcal Polysaccharide-23 05/15/2019    Tdap 02/04/2013    Conditions to be addressed/monitored:  {USCCMDZASSESSMENTOPTIONS:23563}  There are no care plans that you recently modified to display for this patient.    Medication Assistance: {MEDASSISTANCEINFO:25044}  Compliance/Adherence/Medication fill history: Care Gaps: ***  Star-Rating Drugs: ***  Patient's preferred pharmacy is:  [id number] [place] [address]Phone: [phone]:: [phone] [facility] [place]- [address]Phone: [phone]: [phone] [facility]- [address] [place]Phone: [phone]: [phone] [facility] [place]- [address] [place]Phone: [phone]: [phone] Uses pill box? {Yes or If no, why not?:20788} Pt endorses ***% compliance  We discussed: {Pharmacy options:24294} Patient decided to: {US Pharmacy Plan:23885}  Care Plan and Follow Up Patient Decision:  {FOLLOWUP:24991}  Plan: {CM FOLLOW UP PLAN:25073}  ***

## 2021-04-02 NOTE — Chronic Care Management (AMB) (Signed)
Chronic Care Management   CCM RN Visit Note  03/16/2021 Name: Micheal Walls MRN: [id number] DOB: 1936-03-28  Subjective: Micheal Walls is a 85 y.o. year old male who is a primary care patient of [name], [place]. The care management team was consulted for assistance with disease management and care coordination needs.    Engaged with patient by telephone for follow up visit in response to provider referral for case management and/or care coordination services.   Consent to Services:  The patient was given information about Chronic Care Management services, agreed to services, and gave verbal consent prior to initiation of services.  Please see initial visit note for detailed documentation.   Patient agreed to services and verbal consent obtained.   Assessment: Review of patient past medical history, allergies, medications, health status, including review of consultants reports, laboratory and other test data, was performed as part of comprehensive evaluation and provision of chronic care management services.   SDOH (Social Determinants of Health) assessments and interventions performed:    CCM Care Plan  Allergies  Allergen Reactions   Vancomycin Rash    Outpatient Encounter Medications as of 03/16/2021  Medication Sig   allopurinol (ZYLOPRIM) 100 MG tablet Take 100 mg by mouth 2 (two) times daily.   aspirin EC 81 MG tablet Take 81 mg by mouth daily. Swallow whole.   colchicine 0.6 MG tablet Take 0.6 mg by mouth once a week.   Continuous Blood Gluc Sensor (FREESTYLE LIBRE 14 DAY SENSOR) MISC Use as directed to check blood sugars   diltiazem (CARDIZEM CD) 240 MG 24 hr capsule Take 1 capsule (240 mg total) by mouth daily.   dorzolamide (TRUSOPT) 2 % ophthalmic solution Place 1 drop into both eyes 2 (two) times daily.   ferrous sulfate 325 (65 FE) MG tablet Take 325 mg by mouth 2 (two) times daily with a meal.   furosemide (LASIX) 40 MG tablet Take 2 tablets in the morning  and one in the evening   gabapentin (NEURONTIN) 100 MG capsule Take 2 capsules in am 1 capsule mid day and 1 capsule in evening (Patient taking differently: Take 100-200 mg by mouth See admin instructions. Take 200 mg in am 100 mg evening)   gentamicin cream (GARAMYCIN) 0.1 % Apply 1 application topically daily. To foot wound with dressing changes   glucose blood (FREESTYLE PRECISION NEO TEST) test strip Use as instructed   ketotifen (ZADITOR) 0.025 % ophthalmic solution Place 1 drop into both eyes 2 (two) times daily.   latanoprost (XALATAN) 0.005 % ophthalmic solution Place 1 drop into both eyes at bedtime.    Multiple Vitamins-Minerals (MULTIVITAMIN WITH MINERALS) tablet Take 1 tablet by mouth daily. Centrum men 50+   Multiple Vitamins-Minerals (PRESERVISION AREDS) CAPS Take 1 capsule by mouth 2 (two) times daily.   pantoprazole (PROTONIX) 40 MG tablet Take 1 tablet (40 mg total) by mouth daily.   Polyvinyl Alcohol-Povidone PF (REFRESH) 1.4-0.6 % SOLN Place 1 drop into both eyes 2 (two) times daily.   rosuvastatin (CRESTOR) 10 MG tablet Take 1 tablet (10 mg total) by mouth daily.   Semaglutide, [name], (OZEMPIC, [name],) 2 MG/1.5ML SOPN Inject 1 mg into the skin once a week.   senna-docusate (SENOKOT-S) 8.6-50 MG tablet Take 1 tablet by mouth 2 (two) times daily between meals as needed for mild constipation or moderate constipation. (Patient taking differently: Take 1 tablet by mouth 2 (two) times daily.)   tamsulosin (FLOMAX) 0.4 MG CAPS capsule Take  0.4 mg by mouth daily.   No facility-administered encounter medications on file as of 03/16/2021.    Patient Active Problem List   Diagnosis Date Noted   Infection of amputation stump of left lower extremity ([place]) 10/21/2020   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[place])    Left ankle swelling    Diabetic foot infection ([place])    Cerebral thrombosis with cerebral infarction 07/20/2020   Thrombocytopenia ([place]) 07/19/2020   Cerebral ischemia  07/18/2020   Bilateral hearing loss 05/20/2020   Does use hearing aid 05/20/2020   Ulcer of left foot with fat layer exposed ([place]) 01/16/2020   Controlled type 2 diabetes mellitus with stable proliferative retinopathy of both eyes, with long-term current use of insulin ([place]) 01/06/2020   Intermediate stage nonexudative age-related macular degeneration of both eyes 01/06/2020   Left epiretinal membrane 01/06/2020   [place] of right macula 01/06/2020   AKI (acute kidney injury) ([place]) 11/02/2019   Cellulitis 11/02/2019   Elevated TSH 07/10/2018   Nephropathy 02/28/2018   Disturbance of skin sensation 02/28/2018   PAD (peripheral artery disease) ([place]) 07/17/2017   110.1 10/21/2013   Epiphora 04/24/2013   Laxity of eyelid 04/24/2013   Anemia 04/09/2013   History of peripheral vascular disease 04/09/2013   History of stroke 04/09/2013   S/P unilateral BKA (below knee amputation) ([place]) 03/17/2013   Diabetes mellitus type 2 in obese ([place]) 03/17/2013   Mixed hyperlipidemia 03/17/2013   Essential hypertension 03/17/2013   Gout 03/17/2013   CKD (chronic kidney disease), stage III ([place]) 03/17/2013   OSA on CPAP 03/17/2013   Anemia, iron deficiency 03/17/2013   Diverticulosis of colon 03/17/2013   RBBB 03/17/2013   Punctal stenosis, acquired 10/02/2012    Conditions to be addressed/monitored:PAD, Cellulitis of left lower extremity, Neuropathy, Mixed hyperlipidemia, Type 2 diabetes mellitus, stage 4 CKD   Care Plan : RN Care Manager Plan of Care  Updates made by Lynne Logan, RN since 04/02/2021 12:00 AM     Problem: Chronic disease education and Care Coordination needs for PAD, Cellulitis of left lower extremity, Neuropathy, Mixed hyperlipidemia, Type 2 diabetes mellitus, Stage 4 CKD   Priority: High     Long-Range Goal: Establish Plan for Chronic disease education and Care Coordination needs for PAD, Cellulitis of left lower extremity, Neuropathy, Mixed hyperlipidemia, Type 2  diabetes mellitus, Stage 4 CKD   Start Date: 03/16/2021  Expected End Date: 03/16/2022  This Visit's Progress: On track  Priority: High  Note:   Current Barriers:  Knowledge Deficits related to plan of care for management of PAD, Cellulitis of left lower extremity, Neuropathy, Mixed hyperlipidemia, Type 2 diabetes mellitus, Stage 4 CKD  Chronic Disease Management support and education needs related to PAD, Cellulitis of left lower extremity, Neuropathy, Mixed hyperlipidemia, Type 2 diabetes mellitus, Stage 4 CKD   RNCM Clinical Goal(s):  Patient will verbalize understanding of plan for management of PAD, Cellulitis of left lower extremity, Neuropathy, Mixed hyperlipidemia, Type 2 diabetes mellitus  demonstrate Improved health management independence   continue to work with RN Care Manager to address care management and care coordination needs related to  PAD, Cellulitis of left lower extremity, Neuropathy, Mixed hyperlipidemia, Type 2 diabetes mellitus through collaboration with RN Care manager, provider, and care team.   Interventions: 1:1 collaboration with primary care provider regarding development and update of comprehensive plan of care as evidenced by provider attestation and co-signature Inter-disciplinary care team collaboration (see longitudinal plan of care) Evaluation of current treatment plan related to  self management and patient's adherence to plan as established by provider  Goal on track:  Yes Evaluation of current treatment plan related to  Cellulitis of left lower extremity , self-management and patient's adherence to plan as established by provider. Discussed plans with patient for ongoing care management follow up and provided patient with direct contact information for care management team Provided education to patient re: signs and symptoms suggestive of wound infection and when to call the doctor; Discussed plans with patient for ongoing care management follow up and  provided patient with direct contact information for care management team; Reviewed scheduled/upcoming provider appointments including; 03/18/21 Dr. Theda Sers, Vascular Surgery; 03/23/21 Dr. Sherryle Lis, Podiatry  Diabetes Interventions: Assessed patient's understanding of A1c goal: <7% Provided education to patient about basic DM disease process; Reviewed medications with patient and discussed importance of medication adherence; Counseled on importance of regular laboratory monitoring as prescribed; Discussed plans with patient for ongoing care management follow up and provided patient with direct contact information for care management team; Advised patient, providing education and rationale, to check cbg daily before meals and at bedtime and record, calling PCP and or RN Care Manager for findings outside established parameters; Screening for signs and symptoms of depression related to chronic disease state;  Assessed social determinant of health barriers;  Lab Results  Component Value Date   HGBA1C 6.5 (H) 02/04/2021   Chronic Kidney Disease Interventions:  (Status:  Goal on track:  Yes Evaluation of current treatment plan related to chronic kidney disease self management and patient's adherence to plan as established by provider      Reviewed prescribed diet increase water to 64 oz daily  Discussed plans with patient for ongoing care management follow up and provided patient with direct contact information for care management team    Provided education on kidney disease progression    Last practice recorded BP readings:  BP Readings from Last 3 Encounters:  03/18/21 140/67  03/11/21 (!) 143/80  02/26/21 (!) 119/53  Most recent eGFR/CrCl:  Lab Results  Component Value Date   EGFR 45 (L) 02/04/2021    No components found for: CRCL  Patient Goals/Self-Care Activities: Patient will self administer medications as prescribed Patient will attend all scheduled provider appointments Patient  will call pharmacy for medication refills Patient will call provider office for new concerns or questions  Follow Up Plan:  Telephone follow up appointment with care management team member scheduled for:  05/14/21     Plan:Telephone follow up appointment with care management team member scheduled for:  05/14/21  Barb Merino, RN, BSN, CCM Care Management Coordinator [facility]  Direct Phone: [phone]

## 2021-04-02 NOTE — Patient Instructions (Signed)
Visit Information   Consent to CCM Services: Micheal Walls was given information about Chronic Care Management services including:  CCM service includes personalized support from designated clinical staff supervised by his physician, including individualized plan of care and coordination with other care providers 24/7 contact phone numbers for assistance for urgent and routine care needs. Service will only be billed when office clinical staff spend 20 minutes or more in a month to coordinate care. Only one practitioner may furnish and bill the service in a calendar month. The patient may stop CCM services at any time (effective at the end of the month) by phone call to the office staff. The patient will be responsible for cost sharing (co-pay) of up to 20% of the service fee (after annual deductible is met).  Patient agreed to services and verbal consent obtained.   The patient verbalized understanding of instructions, educational materials, and care plan provided today and declined offer to receive copy of patient instructions, educational materials, and care plan.   Telephone follow up appointment with care management team member scheduled for: 05/14/21  Barb Merino, RN, BSN, CCM Care Management Coordinator [place] [facility]  Direct Phone: [phone]    CLINICAL CARE PLAN:  Patient Care Plan: RN Care Manager Plan of Care     Problem Identified: Chronic disease education and Care Coordination needs for PAD, Cellulitis of left lower extremity, Neuropathy, Mixed hyperlipidemia, Type 2 diabetes mellitus, Stage 4 CKD   Priority: High     Long-Range Goal: Establish Plan for Chronic disease education and Care Coordination needs for PAD, Cellulitis of left lower extremity, Neuropathy, Mixed hyperlipidemia, Type 2 diabetes mellitus, Stage 4 CKD   Start Date: 03/16/2021  Expected End Date: 03/16/2022  This Visit's Progress: On track  Priority: High   Note:   Current Barriers:  Knowledge Deficits related to plan of care for management of PAD, Cellulitis of left lower extremity, Neuropathy, Mixed hyperlipidemia, Type 2 diabetes mellitus, Stage 4 CKD  Chronic Disease Management support and education needs related to PAD, Cellulitis of left lower extremity, Neuropathy, Mixed hyperlipidemia, Type 2 diabetes mellitus, Stage 4 CKD   RNCM Clinical Goal(s):  Patient will verbalize understanding of plan for management of PAD, Cellulitis of left lower extremity, Neuropathy, Mixed hyperlipidemia, Type 2 diabetes mellitus  demonstrate Improved health management independence   continue to work with RN Care Manager to address care management and care coordination needs related to  PAD, Cellulitis of left lower extremity, Neuropathy, Mixed hyperlipidemia, Type 2 diabetes mellitus through collaboration with RN Care manager, provider, and care team.   Interventions: 1:1 collaboration with primary care provider regarding development and update of comprehensive plan of care as evidenced by provider attestation and co-signature Inter-disciplinary care team collaboration (see longitudinal plan of care) Evaluation of current treatment plan related to  self management and patient's adherence to plan as established by provider  Goal on track:  Yes Evaluation of current treatment plan related to  Cellulitis of left lower extremity , self-management and patient's adherence to plan as established by provider. Discussed plans with patient for ongoing care management follow up and provided patient with direct contact information for care management team Provided education to patient re: signs and symptoms suggestive of wound infection and when to call the doctor; Discussed plans with patient for ongoing care management follow up and provided patient with direct contact information for care management team; Reviewed scheduled/upcoming provider appointments including;  03/18/21 Dr. Theda Sers, Vascular Surgery; 03/23/21 Dr.  McDonald, Podiatry  Diabetes Interventions: Assessed patient's understanding of A1c goal: <7% Provided education to patient about basic DM disease process; Reviewed medications with patient and discussed importance of medication adherence; Counseled on importance of regular laboratory monitoring as prescribed; Discussed plans with patient for ongoing care management follow up and provided patient with direct contact information for care management team; Advised patient, providing education and rationale, to check cbg daily before meals and at bedtime and record, calling PCP and or RN Care Manager for findings outside established parameters; Screening for signs and symptoms of depression related to chronic disease state;  Assessed social determinant of health barriers;  Lab Results  Component Value Date   HGBA1C 6.5 (H) 02/04/2021   Chronic Kidney Disease Interventions:  (Status:  Goal on track:  Yes Evaluation of current treatment plan related to chronic kidney disease self management and patient's adherence to plan as established by provider      Reviewed prescribed diet increase water to 64 oz daily  Discussed plans with patient for ongoing care management follow up and provided patient with direct contact information for care management team    Provided education on kidney disease progression    Last practice recorded BP readings:  BP Readings from Last 3 Encounters:  03/18/21 140/67  03/11/21 (!) 143/80  02/26/21 (!) 119/53  Most recent eGFR/CrCl:  Lab Results  Component Value Date   EGFR 45 (L) 02/04/2021    No components found for: CRCL  Patient Goals/Self-Care Activities: Patient will self administer medications as prescribed Patient will attend all scheduled provider appointments Patient will call pharmacy for medication refills Patient will call provider office for new concerns or questions  Follow Up Plan:  Telephone  follow up appointment with care management team member scheduled for:  05/14/21

## 2021-04-09 DIAGNOSIS — E785 Hyperlipidemia, unspecified: Secondary | ICD-10-CM | POA: Diagnosis not present

## 2021-04-09 DIAGNOSIS — M109 Gout, unspecified: Secondary | ICD-10-CM | POA: Diagnosis not present

## 2021-04-09 DIAGNOSIS — Z6841 Body Mass Index (BMI) 40.0 and over, adult: Secondary | ICD-10-CM | POA: Diagnosis not present

## 2021-04-09 DIAGNOSIS — E118 Type 2 diabetes mellitus with unspecified complications: Secondary | ICD-10-CM | POA: Diagnosis not present

## 2021-04-09 DIAGNOSIS — N189 Chronic kidney disease, unspecified: Secondary | ICD-10-CM | POA: Diagnosis not present

## 2021-04-09 DIAGNOSIS — N3289 Other specified disorders of bladder: Secondary | ICD-10-CM | POA: Diagnosis not present

## 2021-04-09 DIAGNOSIS — N183 Chronic kidney disease, stage 3 unspecified: Secondary | ICD-10-CM | POA: Diagnosis not present

## 2021-04-09 DIAGNOSIS — I129 Hypertensive chronic kidney disease with stage 1 through stage 4 chronic kidney disease, or unspecified chronic kidney disease: Secondary | ICD-10-CM | POA: Diagnosis not present

## 2021-04-09 DIAGNOSIS — Z23 Encounter for immunization: Secondary | ICD-10-CM | POA: Diagnosis not present

## 2021-04-12 ENCOUNTER — Encounter (INDEPENDENT_AMBULATORY_CARE_PROVIDER_SITE_OTHER): Payer: Self-pay | Admitting: Ophthalmology

## 2021-04-12 ENCOUNTER — Other Ambulatory Visit: Payer: Self-pay

## 2021-04-12 ENCOUNTER — Ambulatory Visit (INDEPENDENT_AMBULATORY_CARE_PROVIDER_SITE_OTHER): Payer: Medicare Other | Admitting: Ophthalmology

## 2021-04-12 ENCOUNTER — Encounter (INDEPENDENT_AMBULATORY_CARE_PROVIDER_SITE_OTHER): Payer: Medicare Other | Admitting: Ophthalmology

## 2021-04-12 DIAGNOSIS — I6330 Cerebral infarction due to thrombosis of unspecified cerebral artery: Secondary | ICD-10-CM | POA: Diagnosis not present

## 2021-04-12 DIAGNOSIS — N184 Chronic kidney disease, stage 4 (severe): Secondary | ICD-10-CM

## 2021-04-12 DIAGNOSIS — E1159 Type 2 diabetes mellitus with other circulatory complications: Secondary | ICD-10-CM | POA: Diagnosis not present

## 2021-04-12 DIAGNOSIS — E113553 Type 2 diabetes mellitus with stable proliferative diabetic retinopathy, bilateral: Secondary | ICD-10-CM

## 2021-04-12 DIAGNOSIS — Z961 Presence of intraocular lens: Secondary | ICD-10-CM

## 2021-04-12 DIAGNOSIS — E782 Mixed hyperlipidemia: Secondary | ICD-10-CM | POA: Diagnosis not present

## 2021-04-12 DIAGNOSIS — E113593 Type 2 diabetes mellitus with proliferative diabetic retinopathy without macular edema, bilateral: Secondary | ICD-10-CM

## 2021-04-12 DIAGNOSIS — Z794 Long term (current) use of insulin: Secondary | ICD-10-CM

## 2021-04-12 NOTE — Assessment & Plan Note (Signed)
OU with controlled PDR.  No progression.  Will observe.  Room for peripheral PRP does exist however.

## 2021-04-12 NOTE — Progress Notes (Signed)
04/12/2021     CHIEF COMPLAINT Patient presents for  Chief Complaint  Patient presents with   Retina Follow Up      HISTORY OF PRESENT ILLNESS: Micheal Walls is a 85 y.o. male who presents to the clinic today for:   HPI     Retina Follow Up   Patient presents with  Diabetic Retinopathy.  In both eyes.  This started 7 months ago.  Duration of 7 months.  Since onset it is stable.      Last edited by Reather Littler, COA on 04/12/2021  1:07 PM.      Referring physician: [name], [place] [address] [place],  [place] [address]  HISTORICAL INFORMATION:   Selected notes from the MEDICAL RECORD NUMBER    Lab Results  Component Value Date   HGBA1C 6.5 (H) 02/04/2021     CURRENT MEDICATIONS: Current Outpatient Medications (Ophthalmic Drugs)  Medication Sig   dorzolamide (TRUSOPT) 2 % ophthalmic solution Place 1 drop into both eyes 2 (two) times daily.   ketotifen (ZADITOR) 0.025 % ophthalmic solution Place 1 drop into both eyes 2 (two) times daily.   latanoprost (XALATAN) 0.005 % ophthalmic solution Place 1 drop into both eyes at bedtime.    Polyvinyl Alcohol-Povidone PF (REFRESH) 1.4-0.6 % SOLN Place 1 drop into both eyes 2 (two) times daily.   No current facility-administered medications for this visit. (Ophthalmic Drugs)   Current Outpatient Medications (Other)  Medication Sig   allopurinol (ZYLOPRIM) 100 MG tablet Take 100 mg by mouth 2 (two) times daily.   aspirin EC 81 MG tablet Take 81 mg by mouth daily. Swallow whole.   colchicine 0.6 MG tablet Take 0.6 mg by mouth once a week.   Continuous Blood Gluc Sensor (FREESTYLE LIBRE 14 DAY SENSOR) MISC Use as directed to check blood sugars   diltiazem (CARDIZEM CD) 240 MG 24 hr capsule Take 1 capsule (240 mg total) by mouth daily.   ferrous sulfate 325 (65 FE) MG tablet Take 325 mg by mouth 2 (two) times daily with a meal.   furosemide (LASIX) 40 MG tablet Take 2 tablets in the morning and one in  the evening   gabapentin (NEURONTIN) 100 MG capsule Take 2 capsules in am 1 capsule mid day and 1 capsule in evening (Patient taking differently: Take 100-200 mg by mouth See admin instructions. Take 200 mg in am 100 mg evening)   gentamicin cream (GARAMYCIN) 0.1 % Apply 1 application topically daily. To foot wound with dressing changes   glucose blood (FREESTYLE PRECISION NEO TEST) test strip Use as instructed   Multiple Vitamins-Minerals (MULTIVITAMIN WITH MINERALS) tablet Take 1 tablet by mouth daily. Centrum men 50+   Multiple Vitamins-Minerals (PRESERVISION AREDS) CAPS Take 1 capsule by mouth 2 (two) times daily.   pantoprazole (PROTONIX) 40 MG tablet Take 1 tablet (40 mg total) by mouth daily.   rosuvastatin (CRESTOR) 10 MG tablet Take 1 tablet (10 mg total) by mouth daily.   Semaglutide, [name], (OZEMPIC, [name],) 2 MG/1.5ML SOPN Inject 1 mg into the skin once a week.   senna-docusate (SENOKOT-S) 8.6-50 MG tablet Take 1 tablet by mouth 2 (two) times daily between meals as needed for mild constipation or moderate constipation. (Patient taking differently: Take 1 tablet by mouth 2 (two) times daily.)   tamsulosin (FLOMAX) 0.4 MG CAPS capsule Take 0.4 mg by mouth daily.   No current facility-administered medications for this visit. (Other)  REVIEW OF SYSTEMS:    ALLERGIES Allergies  Allergen Reactions   Vancomycin Rash    PAST MEDICAL HISTORY Past Medical History:  Diagnosis Date   Acute metabolic encephalopathy 0/08/5007   Amputated below knee ([facility])    right   Anemia    Aspiration pneumonia ([facility]) 3/81/8299   Diastolic heart failure (HCC)    Diverticulosis    DM (diabetes mellitus) ([facility])    Gout    Hyperlipemia    OSA on CPAP    RBBB    Sepsis ([facility]) 11/02/2019   Systemic hypertension    Past Surgical History:  Procedure Laterality Date   ABDOMINAL AORTOGRAM W/LOWER EXTREMITY Left 08/11/2020   Procedure: ABDOMINAL AORTOGRAM W/LOWER EXTREMITY;  Surgeon:  Serafina Mitchell, MD;  Location: [facility];  Service: Cardiovascular;  Laterality: Left;   ABDOMINAL AORTOGRAM W/LOWER EXTREMITY N/A 02/26/2021   Procedure: ABDOMINAL AORTOGRAM W/LOWER EXTREMITY;  Surgeon: Cherre Robins, MD;  Location: [facility];  Service: Cardiovascular;  Laterality: N/A;   AMPUTATION Left 09/14/2020   Procedure: AMPUTATION LEFT GREAT TOE;  Surgeon: [name], DPM;  Location: [facility];  Service: Podiatry;  Laterality: Left;   LEG AMPUTATION BELOW KNEE     right   NM MYOCAR PERF WALL MOTION  09/18/2009   no ischemia   PERIPHERAL VASCULAR BALLOON ANGIOPLASTY Left 08/11/2020   Procedure: PERIPHERAL VASCULAR BALLOON ANGIOPLASTY;  Surgeon: Serafina Mitchell, MD;  Location: [facility];  Service: Cardiovascular;  Laterality: Left;  AT   PERIPHERAL VASCULAR INTERVENTION Left 02/26/2021   Procedure: PERIPHERAL VASCULAR INTERVENTION;  Surgeon: Cherre Robins, MD;  Location: [facility];  Service: Cardiovascular;  Laterality: Left;    FAMILY HISTORY Family History  Problem Relation Age of Onset   Diabetes Mother    Heart attack Father    Cancer Sister     SOCIAL HISTORY Social History   Tobacco Use   Smoking status: Former    Types: Cigars    Quit date: 06/12/2001    Years since quitting: 19.8   Smokeless tobacco: Never  Vaping Use   Vaping Use: Former  Substance Use Topics   Alcohol use: No    Alcohol/week: 0.0 standard drinks   Drug use: No         OPHTHALMIC EXAM:  Base Eye Exam     Visual Acuity (ETDRS)       Right Left   Dist cc 20/50 -2 20/40 +2   Dist ph cc NI 20/30 -1    Correction: Glasses         Tonometry (Tonopen, 1:14 PM)       Right Left   Pressure 17 12         Pupils       Pupils Dark Light Shape React APD   Right PERRL 3 3 Round Minimal None   Left PERRL 3 3 Round Minimal None         Visual Fields (Counting fingers)       Left Right    Full Full         Extraocular Movement        Right Left    Full, Ortho Full, Ortho         Neuro/Psych     Oriented x3: Yes   Mood/Affect: Normal         Dilation     Both eyes: 1.0% Mydriacyl, 2.5% Phenylephrine @ 1:14 PM  Slit Lamp and Fundus Exam     External Exam       Right Left   External Normal Normal         Slit Lamp Exam       Right Left   Lids/Lashes Normal Normal   Conjunctiva/Sclera White and quiet White and quiet   Cornea Clear Clear   Anterior Chamber Deep and quiet Deep and quiet   Iris Round and reactive Round and reactive   Lens Centered posterior chamber intraocular lens Centered posterior chamber intraocular lens   Anterior Vitreous Normal Normal         Fundus Exam       Right Left   Posterior Vitreous Normal Normal   Disc Normal Normal   C/D Ratio 0.3 0.3   Macula Normal, good focal laser, no active CSME Normal, good focal laser, no active CSME   Vessels PDR-quiet PDR-quiet   Periphery Good PRP 360, no active disease Good PRP 360, no active disease            IMAGING AND PROCEDURES  Imaging and Procedures for 04/12/21  OCT, Retina - OU - Both Eyes       Right Eye Quality was borderline. Scan locations included subfoveal. Central Foveal Thickness: 211. Progression has been stable. Findings include abnormal foveal contour, central retinal atrophy, outer retinal atrophy.   Left Eye Quality was good. Scan locations included subfoveal. Central Foveal Thickness: 215. Progression has been stable. Findings include abnormal foveal contour, central retinal atrophy, outer retinal atrophy.   Notes No active CSME OU     Color Fundus Photography Optos - OU - Both Eyes       Right Eye Progression has been stable. Disc findings include normal observations. Macula : microaneurysms.   Left Eye Progression has been stable. Disc findings include normal observations. Macula : microaneurysms.   Notes Proliferative diabetic retinopathy OU quiet, clear  media, no active NVE growth, moderate scatter PRP OU  Regions of nonperfusion temporally OD will observe             ASSESSMENT/PLAN:  Controlled type 2 diabetes mellitus with stable proliferative retinopathy of both eyes, with long-term current use of insulin ([facility]) OU with controlled PDR.  No progression.  Will observe.  Room for peripheral PRP does exist however.  Pseudophakia of both eyes Stable OU     ICD-10-CM   1. Controlled type 2 diabetes mellitus with both eyes affected by proliferative retinopathy without macular edema, unspecified whether long term insulin use (HCC)  E11.3593 OCT, Retina - OU - Both Eyes    Color Fundus Photography Optos - OU - Both Eyes    2. Controlled type 2 diabetes mellitus with stable proliferative retinopathy of both eyes, with long-term current use of insulin ([facility])  O84.1660    Z79.4     3. Pseudophakia of both eyes  Z96.1       1.  OU with moderate scatter PRP maintaining stable condition of the peripheral retina as well as the macula.  No active maculopathy.  Good Moderate scatter PRP OU.  2.  Will observe OU.  3.  Ophthalmic Meds Ordered this visit:  No orders of the defined types were placed in this encounter.      Return in about 6 months (around 10/10/2021) for DILATE OU, COLOR FP, OCT.  There are no Patient Instructions on file for this visit.   Explained the diagnoses, plan, and follow up with the patient and they  expressed understanding.  Patient expressed understanding of the importance of proper follow up care.   Clent Demark Chenee Munns M.D. Diseases & Surgery of the Retina and Vitreous Retina & Diabetic [facility] 04/12/21     Abbreviations: M myopia (nearsighted); A astigmatism; H hyperopia (farsighted); P presbyopia; Mrx spectacle prescription;  CTL contact lenses; OD right eye; OS left eye; OU both eyes  XT exotropia; ET esotropia; PEK punctate epithelial keratitis; PEE punctate epithelial erosions; DES dry eye  syndrome; MGD meibomian gland dysfunction; ATs artificial tears; PFAT's preservative free artificial tears; [place] nuclear sclerotic cataract; PSC posterior subcapsular cataract; ERM epi-retinal membrane; PVD posterior vitreous detachment; RD retinal detachment; DM diabetes mellitus; DR diabetic retinopathy; NPDR non-proliferative diabetic retinopathy; PDR proliferative diabetic retinopathy; CSME clinically significant macular edema; DME diabetic macular edema; dbh dot blot hemorrhages; CWS cotton wool spot; POAG primary open angle glaucoma; C/D cup-to-disc ratio; HVF humphrey visual field; GVF goldmann visual field; OCT optical coherence tomography; IOP intraocular pressure; BRVO Branch retinal vein occlusion; CRVO central retinal vein occlusion; CRAO central retinal artery occlusion; BRAO branch retinal artery occlusion; RT retinal tear; SB scleral buckle; PPV pars plana vitrectomy; VH Vitreous hemorrhage; PRP panretinal laser photocoagulation; IVK intravitreal kenalog; VMT vitreomacular traction; MH Macular hole;  NVD neovascularization of the disc; NVE neovascularization elsewhere; AREDS age related eye disease study; ARMD age related macular degeneration; POAG primary open angle glaucoma; EBMD epithelial/anterior basement membrane dystrophy; ACIOL anterior chamber intraocular lens; IOL intraocular lens; PCIOL posterior chamber intraocular lens; Phaco/IOL phacoemulsification with intraocular lens placement; [place] photorefractive keratectomy; LASIK laser assisted in situ keratomileusis; HTN hypertension; DM diabetes mellitus; COPD chronic obstructive pulmonary disease

## 2021-04-12 NOTE — Assessment & Plan Note (Signed)
Stable OU 

## 2021-04-13 ENCOUNTER — Ambulatory Visit: Payer: Medicare Other | Admitting: Adult Health

## 2021-04-13 ENCOUNTER — Telehealth: Payer: Self-pay

## 2021-04-13 NOTE — Chronic Care Management (AMB) (Signed)
Chronic Care Management Pharmacy Assistant   Name: Micheal Walls  MRN: [id number] DOB: 08-25-1935   Reason for Encounter: Medication Review/ Medication coordination  Recent office visits:  03-16-2021 Riley Churches, RN (CCM)  Recent consult visits:  04-12-2021 Edmon Crape, MD (Ophthalmology). OCT, Retina- OU- both eyes performed. Color fundus photography Optos- OU- Both eyes performed.  03-23-2021 Edwin Cap, DPM (Podiatry). Dressing change on left foot.  03-18-2021 Lars Mage, PA-C (Vascular/vein specialists). Surgery follow up.  03-11-2021 Chuck Hint, MD (Vascular/vein specialists). Follow up.  03-04-2021 Edwin Cap, DPM (Podiatry). Dressing change on left foot.  02-26-2021 Leonie Douglas, MD (Vascular surgery). Abdominal aortogram w/ lower extremity procedure.  Medications: Outpatient Encounter Medications as of 04/13/2021  Medication Sig   allopurinol (ZYLOPRIM) 100 MG tablet Take 100 mg by mouth 2 (two) times daily.   aspirin EC 81 MG tablet Take 81 mg by mouth daily. Swallow whole.   colchicine 0.6 MG tablet Take 0.6 mg by mouth once a week.   Continuous Blood Gluc Sensor (FREESTYLE LIBRE 14 DAY SENSOR) MISC Use as directed to check blood sugars   diltiazem (CARDIZEM CD) 240 MG 24 hr capsule Take 1 capsule (240 mg total) by mouth daily.   dorzolamide (TRUSOPT) 2 % ophthalmic solution Place 1 drop into both eyes 2 (two) times daily.   ferrous sulfate 325 (65 FE) MG tablet Take 325 mg by mouth 2 (two) times daily with a meal.   furosemide (LASIX) 40 MG tablet Take 2 tablets in the morning and one in the evening   gabapentin (NEURONTIN) 100 MG capsule Take 2 capsules in am 1 capsule mid day and 1 capsule in evening (Patient taking differently: Take 100-200 mg by mouth See admin instructions. Take 200 mg in am 100 mg evening)   gentamicin cream (GARAMYCIN) 0.1 % Apply 1 application topically daily. To foot wound with dressing changes    glucose blood (FREESTYLE PRECISION NEO TEST) test strip Use as instructed   ketotifen (ZADITOR) 0.025 % ophthalmic solution Place 1 drop into both eyes 2 (two) times daily.   latanoprost (XALATAN) 0.005 % ophthalmic solution Place 1 drop into both eyes at bedtime.    Multiple Vitamins-Minerals (MULTIVITAMIN WITH MINERALS) tablet Take 1 tablet by mouth daily. Centrum men 50+   Multiple Vitamins-Minerals (PRESERVISION AREDS) CAPS Take 1 capsule by mouth 2 (two) times daily.   pantoprazole (PROTONIX) 40 MG tablet Take 1 tablet (40 mg total) by mouth daily.   Polyvinyl Alcohol-Povidone PF (REFRESH) 1.4-0.6 % SOLN Place 1 drop into both eyes 2 (two) times daily.   rosuvastatin (CRESTOR) 10 MG tablet Take 1 tablet (10 mg total) by mouth daily.   Semaglutide, [name], (OZEMPIC, [name],) 2 MG/1.5ML SOPN Inject 1 mg into the skin once a week.   senna-docusate (SENOKOT-S) 8.6-50 MG tablet Take 1 tablet by mouth 2 (two) times daily between meals as needed for mild constipation or moderate constipation. (Patient taking differently: Take 1 tablet by mouth 2 (two) times daily.)   tamsulosin (FLOMAX) 0.4 MG CAPS capsule Take 0.4 mg by mouth daily.   No facility-administered encounter medications on file as of 04/13/2021.  Reviewed chart for medication changes ahead of medication coordination call.  No OVs, Consults, or hospital visits since last care coordination call/Pharmacist visit. (If appropriate, list visit date, provider name)  No medication changes indicated OR if recent visit, treatment plan here.  BP Readings from Last 3 Encounters:  03/18/21 140/67  03/11/21 (!) 143/80  02/26/21 (!) 119/53    Lab Results  Component Value Date   HGBA1C 6.5 (H) 02/04/2021     Patient obtains medications through Adherence Packaging  90 Days   Last adherence delivery included:  Gabapentin 100 mg- 2 tablets tid (breakfast, lunch, evening meal) Allopurinol 100 mg- 2 tablets daily (breakfast) Flomax 0.4  mg- 1 tablet daily (breakfast)                    Colchicine 0.6 mg- 1 tablet once weekly (breakfast) Diltiazem CD 240 mg- 1 tablet daily (breakfast) Rosuvastatin 10 mg- 1 tablet once on Monday (bedtime) Furosemide 40 mg- 2 tablets in the morning and 1 tablet in the evening (breakfast, evening meal) Pantoprazole 40 mg- 1 tablet daily (bedtime)  Patient declined (meds) last month: None  Patient is due for next adherence delivery on: 04-23-2021  Called patient and reviewed medications and coordinated delivery.  This delivery to include: Gabapentin 100 mg- 2 tablets tid (breakfast, lunch, evening meal) Allopurinol 100 mg- 2 tablets daily (breakfast) Flomax 0.4 mg- 1 tablet daily (breakfast)                    Colchicine 0.6 mg- 1 tablet once weekly (breakfast) Diltiazem CD 240 mg- 1 tablet daily (breakfast) Rosuvastatin 10 mg- 1 tablet once on Monday (bedtime) Furosemide 40 mg- 2 tablets in the morning and 1 tablet in the evening (breakfast, evening meal) Pantoprazole 40 mg- 1 tablet daily (bedtime)  No acute/Short fill needed  Patient declined the following medications:  None  Patient needs refills for: Gabapentin  Confirmed delivery date of 04-23-2021 advised patient that pharmacy will contact them the morning of delivery.    Care Gaps: Shingrix overdue Covid booster overdue last completed 04-03-2020 Flu vaccine overdue last completed 05-20-2020 AWV 05-20-2021  Star Rating Drugs: Ozempic 1 mg- Last filled 01-21-2021 for 90 DS [facility] mail order Rosuvastatin 10 mg- Last filled 01-19-2021 [facility]  [place] Clinical Pharmacist Assistant [phone]

## 2021-04-14 ENCOUNTER — Other Ambulatory Visit: Payer: Self-pay

## 2021-04-14 DIAGNOSIS — I739 Peripheral vascular disease, unspecified: Secondary | ICD-10-CM

## 2021-04-14 DIAGNOSIS — G629 Polyneuropathy, unspecified: Secondary | ICD-10-CM

## 2021-04-14 MED ORDER — GABAPENTIN 100 MG PO CAPS: ORAL_CAPSULE | ORAL | 0 refills | Status: DC

## 2021-04-15 ENCOUNTER — Ambulatory Visit (INDEPENDENT_AMBULATORY_CARE_PROVIDER_SITE_OTHER): Payer: Medicare Other | Admitting: Podiatry

## 2021-04-15 ENCOUNTER — Other Ambulatory Visit: Payer: Self-pay

## 2021-04-15 DIAGNOSIS — L97522 Non-pressure chronic ulcer of other part of left foot with fat layer exposed: Secondary | ICD-10-CM

## 2021-04-16 ENCOUNTER — Ambulatory Visit (INDEPENDENT_AMBULATORY_CARE_PROVIDER_SITE_OTHER): Payer: Medicare Other

## 2021-04-16 DIAGNOSIS — Z794 Long term (current) use of insulin: Secondary | ICD-10-CM

## 2021-04-16 DIAGNOSIS — I739 Peripheral vascular disease, unspecified: Secondary | ICD-10-CM

## 2021-04-16 DIAGNOSIS — E1159 Type 2 diabetes mellitus with other circulatory complications: Secondary | ICD-10-CM

## 2021-04-16 NOTE — Patient Instructions (Signed)
Social Worker Visit Information  Goals we discussed today:   Goals Addressed             This Visit's Progress    Mobility and Independence Optimized   On track    Timeframe:  Long-Range Goal Priority:  High Start Date:  3.14.22                                         Next planned outreach: 12.14.22  Patient Goals/Self-Care Activities  patient will: With the help of his children  - Continue to receive mobile meals - drivers are to call patient prior to arriving so he may prepare to answer the door -Contact SW as needed prior to next scheduled call         Materials Provided: Yes: Provided verbal and written education on mobile meals program  Patient verbalizes understanding of instructions provided today and agrees to view in [place].   Follow Up Plan: SW will follow up with patient by phone over the next month  [name], [name], [name], Certified Dementia Practitioner [facility] [phone]

## 2021-04-16 NOTE — Chronic Care Management (AMB) (Signed)
Chronic Care Management    Social Work Note  04/16/2021 Name: Micheal Walls MRN: [id number] DOB: 11-Jun-1936  Micheal Walls is a 85 y.o. year old male who is a primary care patient of [name], [place]. The CCM team was consulted to assist the patient with chronic disease management and/or care coordination needs related to:  PAD and DM II .   Engaged with [name] by phone  for follow up visit in response to provider referral for social work chronic care management and care coordination services.   Consent to Services:  The patient was given information about Chronic Care Management services, agreed to services, and gave verbal consent prior to initiation of services.  Please see initial visit note for detailed documentation.   Patient agreed to services and consent obtained.   Assessment: Review of patient past medical history, allergies, medications, and health status, including review of relevant consultants reports was performed today as part of a comprehensive evaluation and provision of chronic care management and care coordination services.     SDOH (Social Determinants of Health) assessments and interventions performed:    Advanced Directives Status: Not addressed in this encounter.  CCM Care Plan  Allergies  Allergen Reactions   Vancomycin Rash    Outpatient Encounter Medications as of 04/16/2021  Medication Sig   allopurinol (ZYLOPRIM) 100 MG tablet Take 100 mg by mouth 2 (two) times daily.   aspirin EC 81 MG tablet Take 81 mg by mouth daily. Swallow whole.   colchicine 0.6 MG tablet Take 0.6 mg by mouth once a week.   Continuous Blood Gluc Sensor (FREESTYLE LIBRE 14 DAY SENSOR) MISC Use as directed to check blood sugars   diltiazem (CARDIZEM CD) 240 MG 24 hr capsule Take 1 capsule (240 mg total) by mouth daily.   dorzolamide (TRUSOPT) 2 % ophthalmic solution Place 1 drop into both eyes 2 (two) times daily.   ferrous sulfate 325 (65 FE) MG tablet  Take 325 mg by mouth 2 (two) times daily with a meal.   furosemide (LASIX) 40 MG tablet Take 2 tablets in the morning and one in the evening   gabapentin (NEURONTIN) 100 MG capsule Take 2 capsules in am 1 capsule mid day and 1 capsule in evening   gentamicin cream (GARAMYCIN) 0.1 % Apply 1 application topically daily. To foot wound with dressing changes   glucose blood (FREESTYLE PRECISION NEO TEST) test strip Use as instructed   ketotifen (ZADITOR) 0.025 % ophthalmic solution Place 1 drop into both eyes 2 (two) times daily.   latanoprost (XALATAN) 0.005 % ophthalmic solution Place 1 drop into both eyes at bedtime.    Multiple Vitamins-Minerals (MULTIVITAMIN WITH MINERALS) tablet Take 1 tablet by mouth daily. Centrum men 50+   Multiple Vitamins-Minerals (PRESERVISION AREDS) CAPS Take 1 capsule by mouth 2 (two) times daily.   pantoprazole (PROTONIX) 40 MG tablet Take 1 tablet (40 mg total) by mouth daily.   Polyvinyl Alcohol-Povidone PF (REFRESH) 1.4-0.6 % SOLN Place 1 drop into both eyes 2 (two) times daily.   rosuvastatin (CRESTOR) 10 MG tablet Take 1 tablet (10 mg total) by mouth daily.   Semaglutide, [name], (OZEMPIC, [name],) 2 MG/1.5ML SOPN Inject 1 mg into the skin once a week.   senna-docusate (SENOKOT-S) 8.6-50 MG tablet Take 1 tablet by mouth 2 (two) times daily between meals as needed for mild constipation or moderate constipation. (Patient taking differently: Take 1 tablet by mouth 2 (two) times daily.)  tamsulosin (FLOMAX) 0.4 MG CAPS capsule Take 0.4 mg by mouth daily.   No facility-administered encounter medications on file as of 04/16/2021.    Patient Active Problem List   Diagnosis Date Noted   Pseudophakia of both eyes 04/12/2021   Infection of amputation stump of left lower extremity ([place]) 10/21/2020   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[place])    Left ankle swelling    Diabetic foot infection ([place])    Cerebral thrombosis with cerebral infarction 07/20/2020   Thrombocytopenia  ([place]) 07/19/2020   Cerebral ischemia 07/18/2020   Bilateral hearing loss 05/20/2020   Does use hearing aid 05/20/2020   Ulcer of left foot with fat layer exposed ([place]) 01/16/2020   Controlled type 2 diabetes mellitus with stable proliferative retinopathy of both eyes, with long-term current use of insulin ([place]) 01/06/2020   Intermediate stage nonexudative age-related macular degeneration of both eyes 01/06/2020   Left epiretinal membrane 01/06/2020   [place] of right macula 01/06/2020   AKI (acute kidney injury) ([place]) 11/02/2019   Cellulitis 11/02/2019   Elevated TSH 07/10/2018   Nephropathy 02/28/2018   Disturbance of skin sensation 02/28/2018   PAD (peripheral artery disease) ([place]) 07/17/2017   110.1 10/21/2013   Epiphora 04/24/2013   Laxity of eyelid 04/24/2013   Anemia 04/09/2013   History of peripheral vascular disease 04/09/2013   History of stroke 04/09/2013   S/P unilateral BKA (below knee amputation) ([place]) 03/17/2013   Diabetes mellitus type 2 in obese ([place]) 03/17/2013   Mixed hyperlipidemia 03/17/2013   Essential hypertension 03/17/2013   Gout 03/17/2013   CKD (chronic kidney disease), stage III ([place]) 03/17/2013   OSA on CPAP 03/17/2013   Anemia, iron deficiency 03/17/2013   Diverticulosis of colon 03/17/2013   RBBB 03/17/2013   Punctal stenosis, acquired 10/02/2012    Conditions to be addressed/monitored: DMII and PAD  Care Plan : Social Work [facility] Plan of Care  Updates made by [name] since 04/16/2021 12:00 AM     Problem: Mobility and Independence      Long-Range Goal: Mobility and Independence Optimized   Start Date: 08/24/2020  This Visit's Progress: On track  Recent Progress: On track  Priority: High  Note:   Current Barriers:  Chronic disease management support and education needs related to DM and PAD   Limited access to caregiver Unable to independently prepare meals  Social Worker Clinical Goal(s):  patient will work with SW to identify  and address any acute and/or chronic care coordination needs related to the self health management of DM and PAD   the patient and his daughter will work with SW to identify caregiver resources for in the home  the patient will be placed on the wait list for mobile meals Goal Met  CCM SW Interventions: Completed with the [name] Inter-disciplinary care team collaboration (see longitudinal plan of care) Collaboration with [name], [place] regarding development and update of comprehensive plan of care as evidenced by provider attestation and co-signature Successful outbound call placed to the patients daughter to assess goal progression Discussed the patient continues to receive mobile meals but is anxious he will lose this program as he has missed two meal deliveries in the past due to not being able to get to the door on time Advised SW will attempt to contact [employer] to discuss patients barriers with ambulation and brainstorm interventions Confirmed Micheal Walls can view patients MyChart - advised SW would communicate outcome of call in after visit summary  Successful outbound call placed to [facility] with [place] to discuss barriers with patient answering the door in a timely manner Micheal Walls reports patients are no longer allowed to leave coolers on their porch for meals Discussed Micheal Walls will add to patient case notes the driver is to call the patient prior to arriving to alert they are on the way. She has noted patient takes a while to answer the door and has instructed volunteers to ring the traditional doorbell instead of the "Ring" bell as that is connected to daughters phone and does not ring inside the home Determined if a client misses 3 meals within a 30 day period they lose the service- once it has been 30 days without missing a delivery the patients 2 missed meals will no longer count against him SW to communicate plan to  patients daughter via [place] communication  Patient Goals/Self-Care Activities  patient will: With the help of his children  - Continue to receive mobile meals - drivers are to call patient prior to arriving so he may prepare to answer the door -Contact SW as needed prior to next scheduled call  Follow Up Plan:  SW will follow up with the patient over the next 45 days         Follow Up Plan: SW will follow up with patient by phone over the next month      [name], [name], [name], Certified Dementia Practitioner [place] [facility] [place] [facility] [phone]

## 2021-04-19 ENCOUNTER — Other Ambulatory Visit: Payer: Self-pay | Admitting: Nurse Practitioner

## 2021-04-19 DIAGNOSIS — I739 Peripheral vascular disease, unspecified: Secondary | ICD-10-CM

## 2021-04-19 DIAGNOSIS — G629 Polyneuropathy, unspecified: Secondary | ICD-10-CM

## 2021-04-19 NOTE — Progress Notes (Signed)
  Subjective:  Patient ID: Micheal Walls, male    DOB: 11/10/1935,  MRN: [id number]  Chief Complaint  Patient presents with   Foot Ulcer    Left foot 3 week follow up     DOS: 09/14/2020 Procedure: Left hallux amputation  85 y.o. male returns for post-op check.  Returns for follow-up for ongoing wound care, they are waiting off on amputation for now  Review of Systems: Negative except as noted in the HPI. Denies N/V/F/Ch.   Objective:   There were no vitals filed for this visit.  There is no height or weight on file to calculate BMI. Constitutional Well developed. Well nourished.  Vascular Foot warm and well perfused. Capillary refill normal to all digits.   Neurologic Normal speech. Oriented to person, place, and time. Epicritic sensation to light touch grossly present bilaterally.  Dermatologic Open ulcer of left foot at amputation site with exposed subcutaneous tissue measuring 0.7 x 0.4 x 0.1 cm, significant improvement since last time I saw him with hyperkeratotic tissue surrounding, new lateral foot blister very superficial only limited to breakdown of epidermis, no deep ulceration  Orthopedic:  Minimal tenderness to palpation noted about the surgical site.          ABI 0.7 Assessment:   No diagnosis found.       Plan:  Patient was evaluated and treated and all questions answered.  Ulcer left foot -Remarkably he has had actually quite a bit of improvement since I saw him last time despite his poor vascular flow.  I think this point we should hold off on amputation and see how long ongoing wound care can see if we can improve this.  Continue gentamicin at home daily dressing changes and he may begin bathing with antibacterial soap gently. -Debridement as below. -Dressed with Iodosorb, DSD. -New lateral foot ulceration is shallow and superficial only limited to breakdown of skin.  We will keep close monitoring on this and see him back in a few  weeks  Procedure: Excisional Debridement of Wound Rationale: Removal of non-viable soft tissue from the wound to promote healing.  Anesthesia: none Post-Debridement Wound Measurements: 0.7 x 0.4 x 0.1 Type of Debridement: Sharp Excisional Tissue Removed: Non-viable soft tissue Depth of Debridement: subcutaneous tissue. Technique: Sharp excisional debridement to bleeding, viable wound base.  Dressing: Dry, sterile, compression dressing. Disposition: Patient tolerated procedure well.       No follow-ups on file.

## 2021-04-23 ENCOUNTER — Telehealth: Payer: Self-pay

## 2021-04-23 ENCOUNTER — Ambulatory Visit (INDEPENDENT_AMBULATORY_CARE_PROVIDER_SITE_OTHER): Payer: Medicare Other | Admitting: Cardiovascular Disease

## 2021-04-23 ENCOUNTER — Other Ambulatory Visit: Payer: Self-pay

## 2021-04-23 ENCOUNTER — Encounter: Payer: Self-pay | Admitting: Cardiovascular Disease

## 2021-04-23 VITALS — BP 118/60 | HR 80 | Ht 73.0 in | Wt 248.0 lb

## 2021-04-23 DIAGNOSIS — E785 Hyperlipidemia, unspecified: Secondary | ICD-10-CM | POA: Diagnosis not present

## 2021-04-23 DIAGNOSIS — G4733 Obstructive sleep apnea (adult) (pediatric): Secondary | ICD-10-CM

## 2021-04-23 DIAGNOSIS — I739 Peripheral vascular disease, unspecified: Secondary | ICD-10-CM | POA: Diagnosis not present

## 2021-04-23 DIAGNOSIS — I10 Essential (primary) hypertension: Secondary | ICD-10-CM | POA: Diagnosis not present

## 2021-04-23 DIAGNOSIS — E669 Obesity, unspecified: Secondary | ICD-10-CM

## 2021-04-23 DIAGNOSIS — Z89519 Acquired absence of unspecified leg below knee: Secondary | ICD-10-CM

## 2021-04-23 DIAGNOSIS — I6330 Cerebral infarction due to thrombosis of unspecified cerebral artery: Secondary | ICD-10-CM

## 2021-04-23 DIAGNOSIS — Z9989 Dependence on other enabling machines and devices: Secondary | ICD-10-CM

## 2021-04-23 DIAGNOSIS — N184 Chronic kidney disease, stage 4 (severe): Secondary | ICD-10-CM

## 2021-04-23 DIAGNOSIS — E1169 Type 2 diabetes mellitus with other specified complication: Secondary | ICD-10-CM | POA: Diagnosis not present

## 2021-04-23 DIAGNOSIS — L97522 Non-pressure chronic ulcer of other part of left foot with fat layer exposed: Secondary | ICD-10-CM | POA: Diagnosis not present

## 2021-04-23 MED ORDER — ROSUVASTATIN CALCIUM 20 MG PO TABS: 20.0000 mg | ORAL_TABLET | Freq: Every day | ORAL | 3 refills | Status: DC

## 2021-04-23 NOTE — Chronic Care Management (AMB) (Signed)
    Chronic Care Management Pharmacy Assistant   Name: Micheal Walls  MRN: [id number] DOB: [birth year]   Reason for Encounter: Medication delivery    Medications: Outpatient Encounter Medications as of 04/23/2021  Medication Sig   allopurinol (ZYLOPRIM) 100 MG tablet Take 100 mg by mouth 2 (two) times daily.   aspirin EC 81 MG tablet Take 81 mg by mouth daily. Swallow whole.   colchicine 0.6 MG tablet Take 0.6 mg by mouth once a week.   Continuous Blood Gluc Sensor (FREESTYLE LIBRE 14 DAY SENSOR) MISC Use as directed to check blood sugars   diltiazem (CARDIZEM CD) 240 MG 24 hr capsule Take 1 capsule (240 mg total) by mouth daily.   dorzolamide (TRUSOPT) 2 % ophthalmic solution Place 1 drop into both eyes 2 (two) times daily.   ferrous sulfate 325 (65 FE) MG tablet Take 325 mg by mouth 2 (two) times daily with a meal.   furosemide (LASIX) 40 MG tablet Take 2 tablets in the morning and one in the evening   gabapentin (NEURONTIN) 100 MG capsule TAKE TWO CAPSULES BY MOUTH EVERY MORNING and TAKE ONE CAPSULE BY MOUTH AT NOON and TAKE ONE CAPSULE BY MOUTH EVERY EVENING   gentamicin cream (GARAMYCIN) 0.1 % Apply 1 application topically daily. To foot wound with dressing changes   glucose blood (FREESTYLE PRECISION NEO TEST) test strip Use as instructed   ketotifen (ZADITOR) 0.025 % ophthalmic solution Place 1 drop into both eyes 2 (two) times daily.   latanoprost (XALATAN) 0.005 % ophthalmic solution Place 1 drop into both eyes at bedtime.    Multiple Vitamins-Minerals (MULTIVITAMIN WITH MINERALS) tablet Take 1 tablet by mouth daily. Centrum men 50+   Multiple Vitamins-Minerals (PRESERVISION AREDS) CAPS Take 1 capsule by mouth 2 (two) times daily.   pantoprazole (PROTONIX) 40 MG tablet Take 1 tablet (40 mg total) by mouth daily.   Polyvinyl Alcohol-Povidone PF (REFRESH) 1.4-0.6 % SOLN Place 1 drop into both eyes 2 (two) times daily.   rosuvastatin (CRESTOR) 20 MG tablet Take 1 tablet (20 mg  total) by mouth daily.   Semaglutide, [name], (OZEMPIC, [name],) 2 MG/1.5ML SOPN Inject 1 mg into the skin once a week.   senna-docusate (SENOKOT-S) 8.6-50 MG tablet Take 1 tablet by mouth 2 (two) times daily between meals as needed for mild constipation or moderate constipation. (Patient taking differently: Take 1 tablet by mouth 2 (two) times daily.)   tamsulosin (FLOMAX) 0.4 MG CAPS capsule Take 0.4 mg by mouth daily.   No facility-administered encounter medications on file as of 04/23/2021.    04-23-2021: Patient's daughter called and stated patient will need medications brought back out. Patient's wife who has slight confusion declined copay. Sent message to delivery team to contact patient's daughter and patient needs medications today.  [place] Pharmacist Assistant [phone]

## 2021-04-23 NOTE — Patient Instructions (Signed)
Medication Instructions:  INCREASE the Rosuvastatin to 20 mg once daily  *If you need a refill on your cardiac medications before your next appointment, please call your pharmacy*   Lab Work: None ordered If you have labs (blood work) drawn today and your tests are completely normal, you will receive your results only by: [place] (if you have MyChart) OR A paper copy in the mail If you have any lab test that is abnormal or we need to change your treatment, we will call you to review the results.   Testing/Procedures: None ordered   Follow-Up: At [facility], you and your health needs are our priority.  As part of our continuing mission to provide you with exceptional heart care, we have created designated Provider Care Teams.  These Care Teams include your primary Cardiologist (physician) and Advanced Practice Providers (APPs -  Physician Assistants and Nurse Practitioners) who all work together to provide you with the care you need, when you need it.  We recommend signing up for the patient portal called "MyChart".  Sign up information is provided on this After Visit Summary.  MyChart is used to connect with patients for Virtual Visits (Telemedicine).  Patients are able to view lab/test results, encounter notes, upcoming appointments, etc.  Non-urgent messages can be sent to your provider as well.   To learn more about what you can do with MyChart, go to [email or web address].    Your next appointment:   12 month(s)  The format for your next appointment:   In Person  Provider:   [name], MD

## 2021-04-23 NOTE — Progress Notes (Signed)
Patient ID: Micheal Walls, male   DOB: 1935-08-22, 85 y.o.   MRN: [id number]    Cardiology Office Note    Date:  04/23/2021   ID:  Micheal Walls, DOB 1935-12-10, MRN [id number]  PCP:  [name], [name]  Cardiologist:   [name], MD   Chief complaint: PAD   History of Present Illness:  Micheal Walls is a 85 y.o. male with a history of previous stroke, right below the knee amputation for diabetes-related peripheral arterial disease, insulin-requiring type 2 diabetes mellitus, hyperlipidemia, hypertension and chronic kidney disease as well as obstructive sleep apnea on chronic CPAP.  He has long standing problems with slowly healing ulceration of the left great toe.  He has been seeing the wound clinic for a while now.  He underwent an angiogram on September 16 that showed severe tibial artery disease that was not amenable to revascularization.  He was felt to need a below the knee amputation or maybe even an above-the-knee amputation to ensure healing (he is nonambulatory for years due to a contralateral amputation).  The patient tells me though that the toe wound is "healing by itself" with assistance from the wound care center.  He has his leg wrapped in elastic bandage for's simultaneous problems of peripheral venous insufficiency and edema.  He has not had fever, chills or drainage from the wound.    He is very sedentary but denies any issues with shortness of breath or chest pain either at rest or with his limited activity.  He has not had palpitations, weakness dizziness or syncope.  He denies focal neurological events.   He does not have orthopnea or PND.  He saw Dr. Claiborne Billings in clinic on August 25. He reports 100% compliance with CPAP and he denies daytime hypersomnolence.  He reports 100% compliance with CPAP and denies daytime hypersomnolence.  His most recent hemoglobin A1c on August 25 was improved at 6.5%.  His most recent lipid profile is from May 2022 and shows a low  HDL of 29 and a relatively high LDL at 102 on rosuvastatin 10 mg once daily.  Most recent creatinine from a couple of months ago was up to 2.4.   He has never had a cardiac catheterization. By noninvasive imaging has no evidence of nuclear perfusion abnormalities and has a normal left ventricular systolic function. Heart failure has not been an issue to date. He has chronic problems with iron deficiency anemia and has previously undergone upper endoscopy, colonoscopy and capsule enteroscopy without a clear source of bleeding other than some degree of right-sided colonic diverticulosis.  His renal dysfunction and anemia have been a disincentive to perform any angiography based procedures.   Past Medical History:  Diagnosis Date   Acute metabolic encephalopathy 0/07/5425   Amputated below knee [facility])    right   Anemia    Aspiration pneumonia ([place]) 0/62/3762   Diastolic heart failure (HCC)    Diverticulosis    DM (diabetes mellitus) ([place])    Gout    Hyperlipemia    OSA on CPAP    RBBB    Sepsis ([place]) 11/02/2019   Systemic hypertension     Past Surgical History:  Procedure Laterality Date   ABDOMINAL AORTOGRAM W/LOWER EXTREMITY Left 08/11/2020   Procedure: ABDOMINAL AORTOGRAM W/LOWER EXTREMITY;  Surgeon: Serafina Mitchell, MD;  Location: [place] [facility];  Service: Cardiovascular;  Laterality: Left;   ABDOMINAL AORTOGRAM W/LOWER EXTREMITY N/A 02/26/2021   Procedure: ABDOMINAL AORTOGRAM W/LOWER EXTREMITY;  Surgeon: Cherre Robins, MD;  Location: [place] [facility];  Service: Cardiovascular;  Laterality: N/A;   AMPUTATION Left 09/14/2020   Procedure: AMPUTATION LEFT GREAT TOE;  Surgeon: [name], DPM;  Location: [place];  Service: Podiatry;  Laterality: Left;   LEG AMPUTATION BELOW KNEE     right   NM MYOCAR PERF WALL MOTION  09/18/2009   no ischemia   PERIPHERAL VASCULAR BALLOON ANGIOPLASTY Left 08/11/2020   Procedure: PERIPHERAL VASCULAR BALLOON ANGIOPLASTY;  Surgeon: Serafina Mitchell,  MD;  Location: [place] [facility];  Service: Cardiovascular;  Laterality: Left;  AT   PERIPHERAL VASCULAR INTERVENTION Left 02/26/2021   Procedure: PERIPHERAL VASCULAR INTERVENTION;  Surgeon: Cherre Robins, MD;  Location: [place] [facility];  Service: Cardiovascular;  Laterality: Left;    Current Medications: Outpatient Medications Prior to Visit  Medication Sig Dispense Refill   allopurinol (ZYLOPRIM) 100 MG tablet Take 100 mg by mouth 2 (two) times daily.     aspirin EC 81 MG tablet Take 81 mg by mouth daily. Swallow whole.     colchicine 0.6 MG tablet Take 0.6 mg by mouth once a week.     Continuous Blood Gluc Sensor (FREESTYLE LIBRE 14 DAY SENSOR) MISC Use as directed to check blood sugars 6 each 1   diltiazem (CARDIZEM CD) 240 MG 24 hr capsule Take 1 capsule (240 mg total) by mouth daily. 90 capsule 3   dorzolamide (TRUSOPT) 2 % ophthalmic solution Place 1 drop into both eyes 2 (two) times daily.     ferrous sulfate 325 (65 FE) MG tablet Take 325 mg by mouth 2 (two) times daily with a meal.     furosemide (LASIX) 40 MG tablet Take 2 tablets in the morning and one in the evening 180 tablet 2   gabapentin (NEURONTIN) 100 MG capsule TAKE TWO CAPSULES BY MOUTH EVERY MORNING and TAKE ONE CAPSULE BY MOUTH AT NOON and TAKE ONE CAPSULE BY MOUTH EVERY EVENING 270 capsule 0   gentamicin cream (GARAMYCIN) 0.1 % Apply 1 application topically daily. To foot wound with dressing changes 30 g 1   glucose blood (FREESTYLE PRECISION NEO TEST) test strip Use as instructed 100 each 12   ketotifen (ZADITOR) 0.025 % ophthalmic solution Place 1 drop into both eyes 2 (two) times daily.     latanoprost (XALATAN) 0.005 % ophthalmic solution Place 1 drop into both eyes at bedtime.      Multiple Vitamins-Minerals (MULTIVITAMIN WITH MINERALS) tablet Take 1 tablet by mouth daily. Centrum men 50+     Multiple Vitamins-Minerals (PRESERVISION AREDS) CAPS Take 1 capsule by mouth 2 (two) times daily.     pantoprazole  (PROTONIX) 40 MG tablet Take 1 tablet (40 mg total) by mouth daily. 90 tablet 1   Polyvinyl Alcohol-Povidone PF (REFRESH) 1.4-0.6 % SOLN Place 1 drop into both eyes 2 (two) times daily.     Semaglutide, [name], (OZEMPIC, [name],) 2 MG/1.5ML SOPN Inject 1 mg into the skin once a week. 9 mL 1   senna-docusate (SENOKOT-S) 8.6-50 MG tablet Take 1 tablet by mouth 2 (two) times daily between meals as needed for mild constipation or moderate constipation. (Patient taking differently: Take 1 tablet by mouth 2 (two) times daily.) 60 tablet 0   tamsulosin (FLOMAX) 0.4 MG CAPS capsule Take 0.4 mg by mouth daily.     rosuvastatin (CRESTOR) 10 MG tablet Take 1 tablet (10 mg total) by mouth daily. 90 tablet 3   No facility-administered  medications prior to visit.     Allergies:   Vancomycin   Social History   Socioeconomic History   Marital status: Married    Spouse name: Not on file   Number of children: Not on file   Years of education: Not on file   Highest education level: Not on file  Occupational History   Occupation: retired  Tobacco Use   Smoking status: Former    Types: Cigars    Quit date: 06/12/2001    Years since quitting: 19.8   Smokeless tobacco: Never  Vaping Use   Vaping Use: Former  Substance and Sexual Activity   Alcohol use: No    Alcohol/week: 0.0 standard drinks   Drug use: No   Sexual activity: Not Currently  Other Topics Concern   Not on file  Social History Narrative   Not on file   Social Determinants of Health   Financial Resource Strain: Low Risk    Difficulty of Paying Living Expenses: Not hard at all  Food Insecurity: No Food Insecurity   Worried About Charity fundraiser in the Last Year: Never true   [place] in the Last Year: Never true  Transportation Needs: No Transportation Needs   Lack of Transportation (Medical): No   Lack of Transportation (Non-Medical): No  Physical Activity: Inactive   Days of Exercise per Week: 0 days    Minutes of Exercise per Session: 0 min  Stress: No Stress Concern Present   Feeling of Stress : Not at all  Social Connections: Not on file     Family History:  The patient's family history includes Cancer in his sister; Diabetes in his mother; Heart attack in his father.   ROS:   Please see the history of present illness.    ROS  All other systems are reviewed and are negative.   PHYSICAL EXAM:   VS:  BP 118/60 (BP Location: Left Arm, Patient Position: Sitting, Cuff Size: Large)   Pulse 80   Ht $R'6\' 1"'Ry$  (1.854 m)   Wt 248 lb (112.5 kg)   SpO2 99%   BMI 32.72 kg/m       General: Alert, oriented x3, no distress, obese Head: no evidence of trauma, PERRL, EOMI, no exophtalmos or lid lag, no myxedema, no xanthelasma; normal ears, nose and oropharynx Neck: normal jugular venous pulsations and no hepatojugular reflux; brisk carotid pulses without delay and no carotid bruits Chest: clear to auscultation, no signs of consolidation by percussion or palpation, normal fremitus, symmetrical and full respiratory excursions Cardiovascular: normal position and quality of the apical impulse, regular rhythm with rare ectopic beats, normal first and widely split second heart sounds, no murmurs, rubs or gallops Abdomen: no tenderness or distention, no masses by palpation, no abnormal pulsatility or arterial bruits, normal bowel sounds, no hepatosplenomegaly Extremities: Right below the knee amputation.  Elastic bandage over left calf but still has 1-2+ soft pitting edema. Neurological: grossly nonfocal Psych: Normal mood and affect   Wt Readings from Last 3 Encounters:  04/23/21 248 lb (112.5 kg)  03/18/21 250 lb (113.4 kg)  03/11/21 250 lb (113.4 kg)      Studies/Labs Reviewed:   EKG:  EKG is ordered today.  It similar to previous tracings.  It shows sinus rhythm with PVCs, long first-degree AV block PR 280 ms, RBBB, QTC 494 ms.  No acute ischemic repolarization abnormalities.  Echocardiogram  07/2020: 1. Left ventricular ejection fraction, by estimation, is 55 to 60%. The  left ventricle has normal function. The left ventricle has no regional  wall motion abnormalities. There is moderate left ventricular hypertrophy.  Indeterminate diastolic filling due  to E-A fusion.   2. Right ventricular systolic function is normal. The right ventricular  size is normal.   3. The pericardial effusion is posterior to the left ventricle.   4. The mitral valve is abnormal. Trivial mitral valve regurgitation.   5. The aortic valve is tricuspid. Aortic valve regurgitation is not  visualized.   6. Aortic dilatation noted. There is mild dilatation of the aortic root,  measuring 42 mm.   7. The inferior vena cava is dilated in size with >50% respiratory  variability, suggesting right atrial pressure of 8 mmHg.   Conclusion(s)/Recommendation(s): No intracardiac source of embolism  detected on this transthoracic study. A transesophageal echocardiogram is  recommended to exclude cardiac source of embolism if clinically indicated.   Arterial duplex of the left lower extremity October 10, 2018:  Unable to obtain ABI (noncompressible vessels) Left: Patent common femoral, superficial femoral, profunda femoral, and  popliteal arteries without evidence of stenosis.  Tibial vessels difficult to identify and evaluate due to edema, however,  numerous collateralls noted throughout the calf, suggesting tibial  occlusive disease.   Recent Labs: Lipid Panel     Component Value Date/Time   CHOL 154 10/29/2020 1156   TRIG 129 10/29/2020 1156   HDL 29 (L) 10/29/2020 1156   CHOLHDL 5.3 (H) 10/29/2020 1156   CHOLHDL 2.4 07/19/2020 0330   VLDL 9 07/19/2020 0330   LDLCALC 102 (H) 10/29/2020 1156   LABVLDL 23 10/29/2020 1156   BMET    Component Value Date/Time   NA 140 02/26/2021 0625   NA 141 02/04/2021 0950   K 3.6 02/26/2021 0625   CL 101 02/26/2021 0625   CO2 19 (L) 02/04/2021 0950   GLUCOSE 136 (H)  02/26/2021 0625   BUN 59 (H) 02/26/2021 0625   BUN 37 (H) 02/04/2021 0950   CREATININE 2.40 (H) 02/26/2021 0625   CREATININE 1.85 (H) 08/27/2020 1525   CALCIUM 10.2 02/04/2021 0950   GFRNONAA 35 (L) 10/24/2020 0325   GFRAA 42 (L) 05/20/2020 0951      ASSESSMENT:    1. Ulcer of left foot with fat layer exposed ([place])   2. Essential hypertension   3. OSA on CPAP   4. Diabetes mellitus type 2 in obese ([place])   5. CKD (chronic kidney disease) stage 4, GFR 15-29 ml/min (HCC)   6. Severe obesity (BMI 35.0-39.9) with comorbidity ([place])   7. Dyslipidemia (high LDL; low HDL)   8. PAD (peripheral artery disease) ([place])   9. S/P unilateral BKA (below knee amputation) (HCC)      PLAN:  In order of problems listed above:  Venous stasis ulcers: His nonhealing toe ulcer actually seems to be improving with treatment of the edema, although arterial insufficiency is clearly contributory.  Hopefully he will be able to avoid amputation. HTN: Well-controlled OSA: Compliant with CPAP, denies hypersomnolence DM: A1c in target range.  Diabetes complicated by retinopathy, nephropathy, PAD. CKD: Most recent creatinine was higher at 2.4.  This was around the time of his last angiogram.  Previous baseline creatinine seems to be around 1.8-1.9 (GFR 35).  Seen in [place] [facility] by Dr. Moshe Cipro. Obesity: Once accounting for lower weight due to amputation, he probably is severely obese if not morbidly obese. HLP: Target LDL cholesterol less than 70.  Suspect some lack of compliance in the  spring of this year since LDL was as low as 43 in February 2022.  Increase rosuvastatin to 20 mg daily.  Recheck labs early next year. PAD s/p R BKA, with a nonhealing wound in the left toe.  Hopefully he will be able to avoid amputation.   Medication Adjustments/Labs and Tests Ordered: Current medicines are reviewed at length with the patient today.  Concerns regarding medicines are outlined above.  Medication  changes, Labs and Tests ordered today are listed in the Patient Instructions below. Patient Instructions  Medication Instructions:  INCREASE the Rosuvastatin to 20 mg once daily  *If you need a refill on your cardiac medications before your next appointment, please call your pharmacy*   Lab Work: None ordered If you have labs (blood work) drawn today and your tests are completely normal, you will receive your results only by: [place] (if you have MyChart) OR A paper copy in the mail If you have any lab test that is abnormal or we need to change your treatment, we will call you to review the results.   Testing/Procedures: None ordered   Follow-Up: At [facility], you and your health needs are our priority.  As part of our continuing mission to provide you with exceptional heart care, we have created designated Provider Care Teams.  These Care Teams include your primary Cardiologist (physician) and Advanced Practice Providers (APPs -  Physician Assistants and Nurse Practitioners) who all work together to provide you with the care you need, when you need it.  We recommend signing up for the patient portal called "MyChart".  Sign up information is provided on this After Visit Summary.  MyChart is used to connect with patients for Virtual Visits (Telemedicine).  Patients are able to view lab/test results, encounter notes, upcoming appointments, etc.  Non-urgent messages can be sent to your provider as well.   To learn more about what you can do with MyChart, go to [email or web address].    Your next appointment:   12 month(s)  The format for your next appointment:   In Person  Provider:   [name], MD      Signed, [name], MD  04/23/2021 1:09 PM    [facility] [address], [place], [place]  [address] Phone: [phone]; Fax: [phone]

## 2021-04-29 ENCOUNTER — Ambulatory Visit (INDEPENDENT_AMBULATORY_CARE_PROVIDER_SITE_OTHER): Payer: Medicare Other | Admitting: Podiatry

## 2021-04-29 ENCOUNTER — Other Ambulatory Visit: Payer: Self-pay

## 2021-04-29 DIAGNOSIS — L97521 Non-pressure chronic ulcer of other part of left foot limited to breakdown of skin: Secondary | ICD-10-CM

## 2021-05-03 ENCOUNTER — Telehealth: Payer: Self-pay

## 2021-05-03 NOTE — Chronic Care Management (AMB) (Signed)
Chronic Care Management Pharmacy Assistant   Name: Micheal Walls  MRN: [id number] DOB: July 13, 1935  Reason for Encounter: Disease State/ Diabetes   Recent office visits:  04-16-2021 Micheal Walls (CCM)  Recent consult visits:  04-15-2021 Micheal Walls, DPM (Podiatry). Dressing change.  04-23-2021 [name], [name], MD (Cardiology). Increase Rosuvastatin from  mg daily to  20 mg daily.  Hospital visits:  None in previous 6 months  Medications: Outpatient Encounter Medications as of 05/03/2021  Medication Sig   allopurinol (ZYLOPRIM) 100 MG tablet Take 100 mg by mouth 2 (two) times daily.   aspirin EC 81 MG tablet Take 81 mg by mouth daily. Swallow whole.   colchicine 0.6 MG tablet Take 0.6 mg by mouth once a week.   Continuous Blood Gluc Sensor (FREESTYLE LIBRE 14 DAY SENSOR) MISC Use as directed to check blood sugars   diltiazem (CARDIZEM CD) 240 MG 24 hr capsule Take 1 capsule (240 mg total) by mouth daily.   dorzolamide (TRUSOPT) 2 % ophthalmic solution Place 1 drop into both eyes 2 (two) times daily.   ferrous sulfate 325 (65 FE) MG tablet Take 325 mg by mouth 2 (two) times daily with a meal.   furosemide (LASIX) 40 MG tablet Take 2 tablets in the morning and one in the evening   gabapentin (NEURONTIN) 100 MG capsule TAKE TWO CAPSULES BY MOUTH EVERY MORNING and TAKE ONE CAPSULE BY MOUTH AT NOON and TAKE ONE CAPSULE BY MOUTH EVERY EVENING   gentamicin cream (GARAMYCIN) 0.1 % Apply 1 application topically daily. To foot wound with dressing changes   glucose blood (FREESTYLE PRECISION NEO TEST) test strip Use as instructed   ketotifen (ZADITOR) 0.025 % ophthalmic solution Place 1 drop into both eyes 2 (two) times daily.   latanoprost (XALATAN) 0.005 % ophthalmic solution Place 1 drop into both eyes at bedtime.    Multiple Vitamins-Minerals (MULTIVITAMIN WITH MINERALS) tablet Take 1 tablet by mouth daily. Centrum men 50+   Multiple Vitamins-Minerals (PRESERVISION AREDS)  CAPS Take 1 capsule by mouth 2 (two) times daily.   pantoprazole (PROTONIX) 40 MG tablet Take 1 tablet (40 mg total) by mouth daily.   Polyvinyl Alcohol-Povidone PF (REFRESH) 1.4-0.6 % SOLN Place 1 drop into both eyes 2 (two) times daily.   rosuvastatin (CRESTOR) 20 MG tablet Take 1 tablet (20 mg total) by mouth daily.   Semaglutide, [name], (OZEMPIC, [name],) 2 MG/1.5ML SOPN Inject 1 mg into the skin once a week.   senna-docusate (SENOKOT-S) 8.6-50 MG tablet Take 1 tablet by mouth 2 (two) times daily between meals as needed for mild constipation or moderate constipation. (Patient taking differently: Take 1 tablet by mouth 2 (two) times daily.)   tamsulosin (FLOMAX) 0.4 MG CAPS capsule Take 0.4 mg by mouth daily.   No facility-administered encounter medications on file as of 05/03/2021.   Recent Relevant Labs: Lab Results  Component Value Date/Time   HGBA1C 6.5 (H) 02/04/2021 09:50 AM   HGBA1C 6.3 (H) 10/29/2020 11:56 AM   MICROALBUR 30 09/28/2020 03:58 PM   MICROALBUR 80 05/15/2019 10:28 AM    Kidney Function Lab Results  Component Value Date/Time   CREATININE 2.40 (H) 02/26/2021 06:25 AM   CREATININE 1.53 (H) 02/04/2021 09:50 AM   CREATININE 1.85 (H) 08/27/2020 03:25 PM   GFRNONAA 35 (L) 10/24/2020 03:25 AM   GFRAA 42 (L) 05/20/2020 09:51 AM    Current antihyperglycemic regimen:  Ozempic 1 mg weekly  What recent interventions/DTPs have been made to  improve glycemic control:  None  Have there been any recent hospitalizations or ED visits since last visit with CPP? No  Patient denies hypoglycemic symptoms  Patient denies hyperglycemic symptoms  How often are you checking your blood sugar? twice daily  What are your blood sugars ranging?  Fasting: 106, 110, Before meals: None After meals: None Bedtime: None  During the week, how often does your blood glucose drop below 70? Never  Are you checking your feet daily/regularly? Patient states daily  Adherence  Review: Is the patient currently on a STATIN medication? Yes Is the patient currently on ACE/ARB medication? No Does the patient have >5 day gap between last estimated fill dates? No  Care Gaps: Shingrix overdue Covid booster overdue Flu vaccine overdue  Star Rating Drugs: Rosuvastatin 20 mg- Last filled 04-23-2021 [facility] Ozempic 1 mg- Last filled 01-21-2021 [facility] mail order (Patient states he has one and a half boxes left)   [place] Clinical Pharmacist Assistant [phone]

## 2021-05-03 NOTE — Progress Notes (Signed)
  Subjective:  Patient ID: Micheal Walls, male    DOB: [birth year],  MRN: [id number]  Chief Complaint  Patient presents with   Foot Ulcer     wound follow up left foot     DOS: 09/14/2020 Procedure: Left hallux amputation  85 y.o. male returns for post-op check.  Returns for follow-up for ongoing wound care, they are waiting off on amputation for now  Review of Systems: Negative except as noted in the HPI. Denies N/V/F/Ch.   Objective:   There were no vitals filed for this visit.  There is no height or weight on file to calculate BMI. Constitutional Well developed. Well nourished.  Vascular Foot warm and well perfused. Capillary refill normal to all digits.   Neurologic Normal speech. Oriented to person, place, and time. Epicritic sensation to light touch grossly present bilaterally.  Dermatologic Prior first toe amputation incision dehiscence has completely healed now.  Significant improvement since last time I saw him with hyperkeratotic tissue surrounding, new lateral foot blister very superficial only limited to breakdown of epidermis, no deep ulceration  Orthopedic:  Minimal tenderness to palpation noted about the surgical site.          ABI 0.7 Assessment:   No diagnosis found.       Plan:  Patient was evaluated and treated and all questions answered.  Ulcer left foot -Overall continues to do well I recommend continuing to apply gentamicin cream to the lateral foot blister ulceration.  I will see him back in a few weeks for reevaluation.  At this point I would recommend we hold off on considering amputation and I think he may build to salvage his limb.       No follow-ups on file.

## 2021-05-11 ENCOUNTER — Ambulatory Visit (INDEPENDENT_AMBULATORY_CARE_PROVIDER_SITE_OTHER): Payer: Medicare Other | Admitting: Nurse Practitioner

## 2021-05-11 ENCOUNTER — Other Ambulatory Visit: Payer: Self-pay

## 2021-05-11 ENCOUNTER — Encounter: Payer: Self-pay | Admitting: Nurse Practitioner

## 2021-05-11 VITALS — BP 120/66 | HR 73 | Temp 99.3°F | Ht 73.0 in

## 2021-05-11 DIAGNOSIS — Z23 Encounter for immunization: Secondary | ICD-10-CM

## 2021-05-11 DIAGNOSIS — I739 Peripheral vascular disease, unspecified: Secondary | ICD-10-CM | POA: Diagnosis not present

## 2021-05-11 DIAGNOSIS — Z794 Long term (current) use of insulin: Secondary | ICD-10-CM

## 2021-05-11 DIAGNOSIS — N1831 Chronic kidney disease, stage 3a: Secondary | ICD-10-CM

## 2021-05-11 DIAGNOSIS — E1159 Type 2 diabetes mellitus with other circulatory complications: Secondary | ICD-10-CM | POA: Diagnosis not present

## 2021-05-11 DIAGNOSIS — E782 Mixed hyperlipidemia: Secondary | ICD-10-CM

## 2021-05-11 DIAGNOSIS — R946 Abnormal results of thyroid function studies: Secondary | ICD-10-CM

## 2021-05-11 DIAGNOSIS — Z89411 Acquired absence of right great toe: Secondary | ICD-10-CM | POA: Diagnosis not present

## 2021-05-11 MED ORDER — SHINGRIX 50 MCG/0.5ML IM SUSR: 0.5000 mL | Freq: Once | INTRAMUSCULAR | 1 refills | Status: AC

## 2021-05-11 NOTE — Patient Instructions (Signed)

## 2021-05-11 NOTE — Progress Notes (Signed)
I,Katawbba Wiggins,acting as a Education administrator for [employer], [name].,have documented all relevant documentation on the behalf of [name], [name],as directed by  [name], [name] while in the presence of [name], [place].   This visit occurred during the SARS-CoV-2 public health emergency.  Safety protocols were in place, including screening questions prior to the visit, additional usage of staff PPE, and extensive cleaning of exam room while observing appropriate contact time as indicated for disinfecting solutions.  Subjective:     Patient ID: Micheal Walls , male    DOB: 06/10/1936 , 85 y.o.   MRN: [id number]   Chief Complaint  Patient presents with   Diabetes    HPI  Patient here for a f/u on his diabetes.  Denies seeing any other provider Dr. Sherryle Lis recommends to hold off on the amputation at this time, his foot is healed. Patient reports he had his flu vaccine at Dr. Darrow Bussing' office but I do not see anything in the notes from 03/04/21 visit  Diabetes He presents for his follow-up diabetic visit. He has type 2 diabetes mellitus. His disease course has been improving. Pertinent negatives for hypoglycemia include no confusion, dizziness or nervousness/anxiousness. There are no diabetic associated symptoms. Pertinent negatives for diabetes include no fatigue, no polydipsia, no polyphagia and no polyuria. There are no hypoglycemic complications. Symptoms are improving. There are no diabetic complications. Risk factors for coronary artery disease include sedentary lifestyle and obesity. Current diabetic treatment includes oral agent (monotherapy) (no longer on Tresiba since December). He is compliant with treatment all of the time. His weight is stable. He is following a diabetic diet. When asked about meal planning, he reported none. He has not had a previous visit with a dietitian. He rarely participates in exercise. His home blood glucose trend is decreasing steadily. (Blood sugar this  morning was 133  ) An ACE inhibitor/angiotensin II receptor blocker is being taken. He sees a podiatrist (Dr Adah Perl).Eye exam is current (Dr. Radene Ou - 01/01/2020).    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 Swallow whole., Disp: , Rfl:    colchicine 0.6 MG tablet, Take 0.6 mg by mouth once a week., Disp: , Rfl:    Continuous Blood Gluc Sensor (FREESTYLE LIBRE 14 DAY SENSOR) MISC, Use as directed to check blood sugars, Disp: 6 each, Rfl: 1   diltiazem (CARDIZEM CD) 240 MG 24 hr capsule, Take 1 capsule (240 mg total) by mouth daily., Disp: 90 capsule, Rfl: 3   dorzolamide (TRUSOPT) 2 % ophthalmic solution, Place 1 drop into both eyes 2 (two) times daily., Disp: , Rfl:    ferrous sulfate 325 (65 FE) MG tablet, Take 325 mg by mouth  2 (two) times daily with a meal., Disp: , Rfl:    furosemide (LASIX) 40 MG tablet, Take 2 tablets in the morning and one in the evening, Disp: 180 tablet, Rfl: 2   gabapentin (NEURONTIN) 100 MG capsule, TAKE TWO CAPSULES BY MOUTH EVERY MORNING and TAKE ONE CAPSULE BY MOUTH AT NOON and TAKE ONE CAPSULE BY MOUTH EVERY EVENING, Disp: 270 capsule, Rfl: 0   gentamicin cream (GARAMYCIN) 0.1 %, Apply 1 application topically daily. To foot wound with dressing changes, Disp: 30 g, Rfl: 1   glucose blood (FREESTYLE PRECISION NEO TEST) test strip, Use as instructed,  Disp: 100 each, Rfl: 12   ketotifen (ZADITOR) 0.025 % ophthalmic solution, Place 1 drop into both eyes 2 (two) times daily., Disp: , Rfl:    latanoprost (XALATAN) 0.005 % ophthalmic solution, Place 1 drop into both eyes at bedtime. , Disp: , Rfl:    Multiple Vitamins-Minerals (MULTIVITAMIN WITH MINERALS) tablet, Take 1 tablet by mouth daily. Centrum men 50+, Disp: , Rfl:    Multiple Vitamins-Minerals (PRESERVISION AREDS) CAPS, Take 1 capsule by mouth 2 (two) times daily., Disp: , Rfl:    pantoprazole (PROTONIX) 40 MG tablet, Take 1 tablet (40 mg total) by mouth daily., Disp: 90 tablet, Rfl: 1   Polyvinyl Alcohol-Povidone PF (REFRESH) 1.4-0.6 % SOLN, Place 1 drop into both eyes 2 (two) times daily., Disp: , Rfl:    Semaglutide, [name], (OZEMPIC, [name],) 2 MG/1.5ML SOPN, Inject 1 mg into the skin once a week., Disp: 9 mL, Rfl: 1   senna-docusate (SENOKOT-S) 8.6-50 MG tablet, Take 1 tablet by mouth 2 (two) times daily between meals as needed for mild constipation or moderate constipation. (Patient taking differently: Take 1 tablet by mouth 2 (two) times daily.), Disp: 60 tablet, Rfl: 0   tamsulosin (FLOMAX) 0.4 MG CAPS capsule, Take 0.4 mg by mouth daily., Disp: , Rfl:    rosuvastatin (CRESTOR) 20 MG tablet, Take 1 tablet (20 mg total) by mouth daily., Disp: 90 tablet, Rfl: 3   SYNTHROID 25 MCG tablet, Take 1 tablet by mouth Monday - Friday, Disp: 20 tablet, Rfl: 2   Allergies  Allergen Reactions   Vancomycin Rash     Review of Systems  Constitutional:  Negative for fatigue.  Endocrine: Negative for polydipsia, polyphagia and polyuria.  Neurological:  Negative for dizziness.  Psychiatric/Behavioral:  Negative for confusion. The patient is not nervous/anxious.     Today's Vitals   05/11/21 1105  BP: 120/66  Pulse: 73  Temp: 99.3 F (37.4 C)  Height: _0  (1.854 m)  PainSc: 0-No pain   Body mass index is 32.72 kg/m.  Wt Readings from Last 3 Encounters:  04/23/21 248 lb (112.5  kg)  03/18/21 250 lb (113.4 kg)  03/11/21 250 lb (113.4 kg)    BP Readings from Last 3 Encounters:  05/20/21 124/60  05/11/21 120/66  04/23/21 118/60    Objective:  Physical Exam Vitals reviewed.  Constitutional:      General: He is not in acute distress.    Appearance: Normal appearance.  HENT:     Ears:     Comments: Hearing aid is present to right ear. 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     Pulses: Normal pulses.     Heart sounds: Normal heart sounds. No murmur heard. Pulmonary:     Effort: Pulmonary effort is normal. No respiratory distress.  Breath sounds: Normal breath sounds. No wheezing.  Musculoskeletal:     Comments: Left AKA. Right great toe amputation  Skin:    Capillary Refill: Capillary refill takes less than 2 seconds.     Comments: He has a dressing to his left foot that is being managed by the wound center  Neurological:     General: No focal deficit present.     Mental Status: He is alert and oriented to person, place, and time.     Cranial Nerves: No cranial nerve deficit.     Motor: No weakness.  Psychiatric:        Mood and Affect: Mood normal.        Behavior: Behavior normal.        Thought Content: Thought content normal.        Judgment: Judgment normal.        Assessment And Plan:     1. Type 2 diabetes mellitus with other circulatory complication, with long-term current use of insulin (HCC) Comments: Blood sugar has been stable, continue current medications - CMP14+EGFR - Hemoglobin A1c  2. Stage 3a chronic kidney disease ([place]) Comments: Continue follow up with Nephrology  3. PAD (peripheral artery disease) ([place])  4. Mixed hyperlipidemia Comments: Statin increased to 20 mg by Cardiology earlier this month - Lipid panel  5. Encounter for immunization - Zoster Vaccine Adjuvanted [facility]) injection; Inject 0.5 mLs into the muscle once for 1 dose. Repeat in 2-6  months  Dispense: 0.5 mL; Refill: 1  6. Abnormal thyroid function test Comments: Will check thyroid levels again today    Patient was given opportunity to ask questions. Patient verbalized understanding of the plan and was able to repeat key elements of the plan. All questions were answered to their satisfaction.  [name], [name]   I, [name], [name], have reviewed all documentation for this visit. The documentation on 05/11/21 for the exam, diagnosis, procedures, and orders are all accurate and complete.   IF YOU HAVE BEEN REFERRED TO A SPECIALIST, IT MAY TAKE 1-2 WEEKS TO SCHEDULE/PROCESS THE REFERRAL. IF YOU HAVE NOT HEARD FROM US/SPECIALIST IN TWO WEEKS, PLEASE GIVE [place] A CALL AT [phone].   THE PATIENT IS ENCOURAGED TO PRACTICE SOCIAL DISTANCING DUE TO THE COVID-19 PANDEMIC.

## 2021-05-12 ENCOUNTER — Telehealth: Payer: Self-pay | Admitting: Nurse Practitioner

## 2021-05-12 LAB — CMP14+EGFR
ALT: 26 IU/L (ref 0–44)
AST: 20 IU/L (ref 0–40)
Albumin/Globulin Ratio: 1.2 (ref 1.2–2.2)
Albumin: 3.7 g/dL (ref 3.6–4.6)
Alkaline Phosphatase: 87 IU/L (ref 44–121)
BUN/Creatinine Ratio: 32 — ABNORMAL HIGH (ref 10–24)
BUN: 76 mg/dL (ref 8–27)
Bilirubin Total: 0.3 mg/dL (ref 0.0–1.2)
CO2: 25 mmol/L (ref 20–29)
Calcium: 9.9 mg/dL (ref 8.6–10.2)
Chloride: 104 mmol/L (ref 96–106)
Creatinine, Ser: 2.36 mg/dL — ABNORMAL HIGH (ref 0.76–1.27)
Globulin, Total: 3.1 g/dL (ref 1.5–4.5)
Glucose: 136 mg/dL — ABNORMAL HIGH (ref 70–99)
Potassium: 4.3 mmol/L (ref 3.5–5.2)
Sodium: 143 mmol/L (ref 134–144)
Total Protein: 6.8 g/dL (ref 6.0–8.5)
eGFR: 27 mL/min/{1.73_m2} — ABNORMAL LOW (ref >59)

## 2021-05-12 LAB — HEMOGLOBIN A1C
Est. average glucose Bld gHb Est-mCnc: 140 mg/dL
Hgb A1c MFr Bld: 6.5 % — ABNORMAL HIGH (ref 4.8–5.6)

## 2021-05-12 LAB — LIPID PANEL
Chol/HDL Ratio: 3.5 ratio (ref 0.0–5.0)
Cholesterol, Total: 108 mg/dL (ref 100–199)
HDL: 31 mg/dL — ABNORMAL LOW (ref >39)
LDL Chol Calc (NIH): 57 mg/dL (ref 0–99)
Triglycerides: 105 mg/dL (ref 0–149)
VLDL Cholesterol Cal: 20 mg/dL (ref 5–40)

## 2021-05-12 NOTE — Telephone Encounter (Signed)
Called Dr. Moshe Cipro to discuss the patients BUN at 48, she did inform me at his October 30th visit his BUM was 68. She also expressed after he had the arteriogram with his recent hospitalization his kidneys declined some and this may be his new norm. She also does not feel [facility]amp is a good option for him due to age and eGFR being borderline. At this time he is not on losartan since his recent kidney function decline.

## 2021-05-13 LAB — T4: T4, Total: 4.6 ug/dL (ref 4.5–12.0)

## 2021-05-13 LAB — TSH: TSH: 2.73 u[IU]/mL (ref 0.450–4.500)

## 2021-05-13 LAB — T3, FREE: T3, Free: 1.7 pg/mL — ABNORMAL LOW (ref 2.0–4.4)

## 2021-05-13 LAB — SPECIMEN STATUS REPORT

## 2021-05-14 ENCOUNTER — Telehealth: Payer: Medicare Other

## 2021-05-14 ENCOUNTER — Telehealth: Payer: Self-pay

## 2021-05-14 NOTE — Telephone Encounter (Signed)
  Care Management   Follow Up Note   05/14/2021 Name: Micheal Walls MRN: [id number] DOB: Jul 11, 1935   Referred by: [name], [name] Reason for referral : Chronic Care Management (RN CM Follow up call )   An unsuccessful telephone outreach was attempted today. The patient was referred to the case management team for assistance with care management and care coordination.   Follow Up Plan: A HIPPA compliant phone message was left for the patient providing contact information and requesting a return call.   Barb Merino, RN, BSN, CCM Care Management Coordinator [place] [facility]  Direct Phone: [phone]

## 2021-05-16 ENCOUNTER — Other Ambulatory Visit: Payer: Self-pay | Admitting: Nurse Practitioner

## 2021-05-16 DIAGNOSIS — E039 Hypothyroidism, unspecified: Secondary | ICD-10-CM

## 2021-05-16 MED ORDER — SYNTHROID 25 MCG PO TABS: ORAL_TABLET | ORAL | 2 refills | Status: DC

## 2021-05-17 ENCOUNTER — Other Ambulatory Visit: Payer: Self-pay

## 2021-05-17 ENCOUNTER — Ambulatory Visit (INDEPENDENT_AMBULATORY_CARE_PROVIDER_SITE_OTHER): Payer: Medicare Other | Admitting: Podiatry

## 2021-05-17 DIAGNOSIS — L97522 Non-pressure chronic ulcer of other part of left foot with fat layer exposed: Secondary | ICD-10-CM

## 2021-05-17 DIAGNOSIS — Z20822 Contact with and (suspected) exposure to covid-19: Secondary | ICD-10-CM | POA: Diagnosis not present

## 2021-05-17 NOTE — Progress Notes (Signed)
  Subjective:  Patient ID: Micheal Walls, male    DOB: 04-Jul-1935,  MRN: [id number]  Chief Complaint  Patient presents with   Foot Ulcer    Left foot, 2 week follow up     DOS: 09/14/2020 Procedure: Left hallux amputation  85 y.o. male returns for post-op check.  Returns for follow-up for ongoing wound care, they are waiting off on amputation for now  Review of Systems: Negative except as noted in the HPI. Denies N/V/F/Ch.   Objective:   There were no vitals filed for this visit.  There is no height or weight on file to calculate BMI. Constitutional Well developed. Well nourished.  Vascular Foot warm and well perfused. Capillary refill normal to all digits.   Neurologic Normal speech. Oriented to person, place, and time. Epicritic sensation to light touch grossly present bilaterally.  Dermatologic Prior first toe amputation incision dehiscence has completely healed now.  Significant improvement since last time I saw him with hyperkeratotic tissue surrounding, lateral foot ulceration full-thickness with exposed subcutaneous tissues measure 1.5 x 0.6 x 0.2 cm   Orthopedic:  Minimal tenderness to palpation noted about the surgical site.           ABI 0.7 Assessment:   1. Ulcer of left foot with fat layer exposed ([place])          Plan:  Patient was evaluated and treated and all questions answered.  Ulcer left foot -Hallux amputation site remains healed -We discussed the etiology and factors that are a part of the wound healing process.  We also discussed the risk of infection both soft tissue and osteomyelitis from open ulceration.  Discussed the risk of limb loss if this happens or worsens. -Debridement as below. -Dressed with Iodosorb, DSD. -Continue home dressing changes daily with gauze and gentamicin cream  Procedure: Excisional Debridement of Wound Rationale: Removal of non-viable soft tissue from the wound to promote healing.  Anesthesia:  none Post-Debridement Wound Measurements: 1.5 cm x 0.6 cm x 0.2cm  Type of Debridement: Sharp Excisional Tissue Removed: Non-viable soft tissue Depth of Debridement: subcutaneous tissue. Technique: Sharp excisional debridement to bleeding, viable wound base.  Dressing: Dry, sterile, compression dressing. Disposition: Patient tolerated procedure well.             Return in about 22 days (around 06/08/2021) for wound care.

## 2021-05-20 ENCOUNTER — Encounter: Payer: Self-pay | Admitting: Cardiovascular Disease

## 2021-05-20 ENCOUNTER — Other Ambulatory Visit: Payer: Self-pay

## 2021-05-20 ENCOUNTER — Ambulatory Visit: Payer: Medicare Other | Admitting: Nurse Practitioner

## 2021-05-20 ENCOUNTER — Ambulatory Visit (INDEPENDENT_AMBULATORY_CARE_PROVIDER_SITE_OTHER): Payer: Medicare Other

## 2021-05-20 VITALS — BP 124/60 | HR 77 | Temp 98.0°F

## 2021-05-20 DIAGNOSIS — Z0000 Encounter for general adult medical examination without abnormal findings: Secondary | ICD-10-CM | POA: Diagnosis not present

## 2021-05-20 MED ORDER — ROSUVASTATIN CALCIUM 20 MG PO TABS: 20.0000 mg | ORAL_TABLET | Freq: Every day | ORAL | 3 refills | Status: DC

## 2021-05-20 NOTE — Progress Notes (Signed)
This visit occurred during the SARS-CoV-2 public health emergency.  Safety protocols were in place, including screening questions prior to the visit, additional usage of staff PPE, and extensive cleaning of exam room while observing appropriate contact time as indicated for disinfecting solutions.  Subjective:   Micheal Walls is a 85 y.o. male who presents for Medicare Annual/Subsequent preventive examination.  Review of Systems     Cardiac Risk Factors include: advanced age (>9men, >56 women);diabetes mellitus;hypertension     Objective:    Today's Vitals   05/20/21 0908  BP: 124/60  Pulse: 77  Temp: 98 F (36.7 C)  TempSrc: Oral  SpO2: 97%   There is no height or weight on file to calculate BMI.  Advanced Directives 05/20/2021 02/26/2021 10/21/2020 09/16/2020 09/14/2020 09/11/2020 09/11/2020  Does Patient Have a Medical Advance Directive? No Yes No No No No No  Type of Advance Directive - [place]  Does patient want to make changes to medical advance directive? - No - Patient declined - - - - -  Copy of [place] in Chart? - - - - - - -  Would patient like information on creating a medical advance directive? No - Patient declined - No - Patient declined No - Patient declined No - Patient declined - No - Patient declined    Current Medications (verified) Outpatient Encounter Medications as of 05/20/2021  Medication Sig   allopurinol (ZYLOPRIM) 100 MG tablet Take 100 mg by mouth 2 (two) times daily.   aspirin EC 81 MG tablet Take 81 mg by mouth daily. Swallow whole.   colchicine 0.6 MG tablet Take 0.6 mg by mouth once a week.   Continuous Blood Gluc Sensor (FREESTYLE LIBRE 14 DAY SENSOR) MISC Use as directed to check blood sugars   dorzolamide (TRUSOPT) 2 % ophthalmic solution Place 1 drop into both eyes 2 (two) times daily.   ferrous sulfate 325 (65 FE) MG tablet Take 325 mg by mouth 2 (two) times daily with a meal.   furosemide  (LASIX) 40 MG tablet Take 2 tablets in the morning and one in the evening   gabapentin (NEURONTIN) 100 MG capsule TAKE TWO CAPSULES BY MOUTH EVERY MORNING and TAKE ONE CAPSULE BY MOUTH AT NOON and TAKE ONE CAPSULE BY MOUTH EVERY EVENING   gentamicin cream (GARAMYCIN) 0.1 % Apply 1 application topically daily. To foot wound with dressing changes   glucose blood (FREESTYLE PRECISION NEO TEST) test strip Use as instructed   ketotifen (ZADITOR) 0.025 % ophthalmic solution Place 1 drop into both eyes 2 (two) times daily.   latanoprost (XALATAN) 0.005 % ophthalmic solution Place 1 drop into both eyes at bedtime.    Multiple Vitamins-Minerals (MULTIVITAMIN WITH MINERALS) tablet Take 1 tablet by mouth daily. Centrum men 50+   Multiple Vitamins-Minerals (PRESERVISION AREDS) CAPS Take 1 capsule by mouth 2 (two) times daily.   pantoprazole (PROTONIX) 40 MG tablet Take 1 tablet (40 mg total) by mouth daily.   Polyvinyl Alcohol-Povidone PF (REFRESH) 1.4-0.6 % SOLN Place 1 drop into both eyes 2 (two) times daily.   rosuvastatin (CRESTOR) 20 MG tablet Take 1 tablet (20 mg total) by mouth daily.   Semaglutide, [name], (OZEMPIC, [name],) 2 MG/1.5ML SOPN Inject 1 mg into the skin once a week.   senna-docusate (SENOKOT-S) 8.6-50 MG tablet Take 1 tablet by mouth 2 (two) times daily between meals as needed for mild constipation or moderate constipation. (Patient taking  differently: Take 1 tablet by mouth 2 (two) times daily.)   SYNTHROID 25 MCG tablet Take 1 tablet by mouth Monday - Friday   tamsulosin (FLOMAX) 0.4 MG CAPS capsule Take 0.4 mg by mouth daily.   diltiazem (CARDIZEM CD) 240 MG 24 hr capsule Take 1 capsule (240 mg total) by mouth daily.   No facility-administered encounter medications on file as of 05/20/2021.    Allergies (verified) Vancomycin   History: Past Medical History:  Diagnosis Date   Acute metabolic encephalopathy 0/06/6008   Amputated below knee [facility])    right   Anemia     Aspiration pneumonia ([place]) 9/32/3557   Diastolic heart failure (HCC)    Diverticulosis    DM (diabetes mellitus) ([place])    Gout    Hyperlipemia    OSA on CPAP    RBBB    Sepsis ([place]) 11/02/2019   Systemic hypertension    Past Surgical History:  Procedure Laterality Date   ABDOMINAL AORTOGRAM W/LOWER EXTREMITY Left 08/11/2020   Procedure: ABDOMINAL AORTOGRAM W/LOWER EXTREMITY;  Surgeon: Serafina Mitchell, MD;  Location: [place] [facility];  Service: Cardiovascular;  Laterality: Left;   ABDOMINAL AORTOGRAM W/LOWER EXTREMITY N/A 02/26/2021   Procedure: ABDOMINAL AORTOGRAM W/LOWER EXTREMITY;  Surgeon: Cherre Robins, MD;  Location: [place] [facility];  Service: Cardiovascular;  Laterality: N/A;   AMPUTATION Left 09/14/2020   Procedure: AMPUTATION LEFT GREAT TOE;  Surgeon: [name], DPM;  Location: [place];  Service: Podiatry;  Laterality: Left;   LEG AMPUTATION BELOW KNEE     right   NM MYOCAR PERF WALL MOTION  09/18/2009   no ischemia   PERIPHERAL VASCULAR BALLOON ANGIOPLASTY Left 08/11/2020   Procedure: PERIPHERAL VASCULAR BALLOON ANGIOPLASTY;  Surgeon: Serafina Mitchell, MD;  Location: [place] [facility];  Service: Cardiovascular;  Laterality: Left;  AT   PERIPHERAL VASCULAR INTERVENTION Left 02/26/2021   Procedure: PERIPHERAL VASCULAR INTERVENTION;  Surgeon: Cherre Robins, MD;  Location: [place] [facility];  Service: Cardiovascular;  Laterality: Left;   Family History  Problem Relation Age of Onset   Diabetes Mother    Heart attack Father    Cancer Sister    Social History   Socioeconomic History   Marital status: Married    Spouse name: Not on file   Number of children: Not on file   Years of education: Not on file   Highest education level: Not on file  Occupational History   Occupation: retired  Tobacco Use   Smoking status: Former    Types: Cigars    Quit date: 06/12/2001    Years since quitting: 19.9   Smokeless tobacco: Never  Vaping Use   Vaping Use: Former   Substance and Sexual Activity   Alcohol use: No    Alcohol/week: 0.0 standard drinks   Drug use: No   Sexual activity: Not Currently  Other Topics Concern   Not on file  Social History Narrative   Not on file   Social Determinants of Health   Financial Resource Strain: Low Risk    Difficulty of Paying Living Expenses: Not hard at all  Food Insecurity: No Food Insecurity   Worried About Charity fundraiser in the Last Year: Never true   [place] in the Last Year: Never true  Transportation Needs: No Transportation Needs   Lack of Transportation (Medical): No   Lack of Transportation (Non-Medical): No  Physical Activity: Inactive   Days of Exercise per  Week: 0 days   Minutes of Exercise per Session: 0 min  Stress: No Stress Concern Present   Feeling of Stress : Not at all  Social Connections: Not on file    Tobacco Counseling Counseling given: Not Answered   Clinical Intake:  Pre-visit preparation completed: Yes  Pain : No/denies pain     Nutritional Risks: None Diabetes: Yes  How often do you need to have someone help you when you read instructions, pamphlets, or other written materials from your doctor or pharmacy?: 1 - Never What is the last grade level you completed in school?: 12th grade  Diabetic? Yes Nutrition Risk Assessment:  Has the patient had any N/V/D within the last 2 months?  No  Does the patient have any non-healing wounds?  No  Has the patient had any unintentional weight loss or weight gain?  No   Diabetes:  Is the patient diabetic?  Yes  If diabetic, was a CBG obtained today?  No  Did the patient bring in their glucometer from home?  No  How often do you monitor your CBG's? Twice daily.   Financial Strains and Diabetes Management:  Are you having any financial strains with the device, your supplies or your medication? No .  Does the patient want to be seen by Chronic Care Management for management of their diabetes?  No  Would  the patient like to be referred to a Nutritionist or for Diabetic Management?  No   Diabetic Exams:  Diabetic Eye Exam: Completed 09/07/2020 Diabetic Foot Exam: Completed 05/11/2021   Interpreter Needed?: No  Information entered by :: NAllen LPN   Activities of Daily Living In your present state of health, do you have any difficulty performing the following activities: 05/20/2021 10/22/2020  Hearing? Tempie Donning  Vision? Y N  Comment blurry at times -  Difficulty concentrating or making decisions? N N  Walking or climbing stairs? Y Y  Dressing or bathing? N N  Doing errands, shopping? N N  Preparing Food and eating ? N -  Using the Toilet? N -  In the past six months, have you accidently leaked urine? N -  Do you have problems with loss of bowel control? N -  Managing your Medications? N -  Managing your Finances? N -  Housekeeping or managing your Housekeeping? N -  Some recent data might be hidden    Patient Care Team: [name], [name] as PCP - General ([place]) [name], [name], MD as PCP - Cardiology (Cardiology) Rex Kras, Claudette Stapler, RN as [place] Management [place], Tillie Rung as [place], Sharyn Blitz, [facility] (Pharmacist)  Indicate any recent Medical Services you may have received from other than [facility] providers in the past year (date may be approximate).     Assessment:   This is a routine wellness examination for [place].  Hearing/Vision screen No results found.  Dietary issues and exercise activities discussed: Current Exercise Habits: The patient does not participate in regular exercise at present   Goals Addressed             This Visit's Progress    Patient Stated       05/20/2021, get leg healed up       Depression Screen PHQ 2/9 Scores 05/20/2021 05/20/2020 09/24/2019 06/25/2019 05/15/2019 03/21/2019 12/17/2018  PHQ - 2 Score 0 0 0 0 0 0 0  PHQ- 9 Score - - - - 3 - -    Fall Risk  Fall Risk   05/20/2021 05/20/2020 09/24/2019 06/25/2019 05/15/2019  Falls in the past year? 1 1 0 0 0  Comment leg gave out had no energy - - -  Number falls in past yr: 0 0 - - -  Injury with Fall? 0 1 - - -  Risk for fall due to : Impaired balance/gait;Impaired mobility;Medication side effect Impaired mobility;Medication side effect - - Medication side effect;Impaired mobility;Impaired balance/gait  Follow up Falls evaluation completed;Education provided;Falls prevention discussed Falls evaluation completed;Education provided;Falls prevention discussed - - -    FALL RISK PREVENTION PERTAINING TO THE HOME:  Any stairs in or around the home? No  If so, are there any without handrails? N/a Home free of loose throw rugs in walkways, pet beds, electrical cords, etc? Yes  Adequate lighting in your home to reduce risk of falls? Yes   ASSISTIVE DEVICES UTILIZED TO PREVENT FALLS:  Life alert? No  Use of a cane, walker or w/c? Yes  Grab bars in the bathroom? Yes  Shower chair or bench in shower? Yes  Elevated toilet seat or a handicapped toilet? Yes   TIMED UP AND GO:  Was the test performed? No .    In wheelchair  Cognitive Function:     6CIT Screen 05/20/2021 05/20/2020 05/15/2019 03/15/2018  What Year? 0 points 0 points 0 points 0 points  What month? 0 points 0 points 0 points 0 points  What time? 0 points 0 points 0 points 0 points  Count back from 20 0 points 2 points 0 points 0 points  Months in reverse 0 points 0 points 0 points 0 points  Repeat phrase 6 points 0 points 2 points 2 points  Total Score $RemoveBef'6 2 2 2    'QvvlRuVObP$ Immunizations Immunization History  Administered Date(s) Administered   Fluad Quad(high Dose 65+) 05/20/2020   Influenza, High Dose Seasonal PF 03/21/2019   PFIZER(Purple Top)SARS-COV-2 Vaccination 08/31/2019, 09/25/2019, 04/03/2020   Pneumococcal Conjugate-13 02/23/2016   Pneumococcal Polysaccharide-23 05/15/2019   Tdap 02/04/2013    TDAP status: Up to date  Flu Vaccine  status: Up to date  Pneumococcal vaccine status: Up to date  Covid-19 vaccine status: Completed vaccines  Qualifies for Shingles Vaccine? Yes   Zostavax completed No   Shingrix Completed?: No.    Education has been provided regarding the importance of this vaccine. Patient has been advised to call insurance company to determine out of pocket expense if they have not yet received this vaccine. Advised may also receive vaccine at local pharmacy or Health Dept. Verbalized acceptance and understanding.  Screening Tests Health Maintenance  Topic Date Due   Zoster Vaccines- Shingrix (1 of 2) Never done   COVID-19 Vaccine (4 - Booster for Pfizer series) 05/29/2020   INFLUENZA VACCINE  01/11/2021   OPHTHALMOLOGY EXAM  09/07/2021   URINE MICROALBUMIN  09/28/2021   HEMOGLOBIN A1C  11/08/2021   FOOT EXAM  05/11/2022   TETANUS/TDAP  02/05/2023   Pneumonia Vaccine 4+ Years old  Completed   HPV VACCINES  Aged Out    Health Maintenance  Health Maintenance Due  Topic Date Due   Zoster Vaccines- Shingrix (1 of 2) Never done   COVID-19 Vaccine (4 - Booster for Pfizer series) 05/29/2020   INFLUENZA VACCINE  01/11/2021    Colorectal cancer screening: No longer required.   Lung Cancer Screening: (Low Dose CT Chest recommended if Age 69-80 years, 30 pack-year currently smoking OR have quit w/in 15years.) does not qualify.   Lung Cancer  Screening Referral: no  Additional Screening:  Hepatitis C Screening: does not qualify;  Vision Screening: Recommended annual ophthalmology exams for early detection of glaucoma and other disorders of the eye. Is the patient up to date with their annual eye exam?  Yes  Who is the provider or what is the name of the office in which the patient attends annual eye exams? Dr. Herbert Deaner, Dr. Zadie Rhine If pt is not established with a provider, would they like to be referred to a provider to establish care? No .   Dental Screening: Recommended annual dental exams for  proper oral hygiene  Community Resource Referral / Chronic Care Management: CRR required this visit?  No   CCM required this visit?  No      Plan:     I have personally reviewed and noted the following in the patient's chart:   Medical and social history Use of alcohol, tobacco or illicit drugs  Current medications and supplements including opioid prescriptions. Patient is not currently taking opioid prescriptions. Functional ability and status Nutritional status Physical activity Advanced directives List of other physicians Hospitalizations, surgeries, and ER visits in previous 12 months Vitals Screenings to include cognitive, depression, and falls Referrals and appointments  In addition, I have reviewed and discussed with patient certain preventive protocols, quality metrics, and best practice recommendations. A written personalized care plan for preventive services as well as general preventive health recommendations were provided to patient.     Kellie Simmering, LPN   70/09/4923   Nurse Notes: none

## 2021-05-20 NOTE — Patient Instructions (Signed)
Micheal Walls , Thank you for taking time to come for your Medicare Wellness Visit. I appreciate your ongoing commitment to your health goals. Please review the following plan we discussed and let me know if I can assist you in the future.   Screening recommendations/referrals: Colonoscopy: not required Recommended yearly ophthalmology/optometry visit for glaucoma screening and checkup Recommended yearly dental visit for hygiene and checkup  Vaccinations: Influenza vaccine: completed 04/09/2021 Pneumococcal vaccine: completed 05/15/2019 Tdap vaccine: completed 02/04/2013, due 02/05/2023 Shingles vaccine: discussed   Covid-19:  04/03/2020, 09/25/2019, 08/31/2019  Advanced directives: Advance directive discussed with you today. Even though you declined this today please call our office should you change your mind and we can give you the proper paperwork for you to fill out.  Conditions/risks identified: none  Next appointment: Follow up in one year for your annual wellness visit.   Preventive Care 35 Years and Older, Male Preventive care refers to lifestyle choices and visits with your health care provider that can promote health and wellness. What does preventive care include? A yearly physical exam. This is also called an annual well check. Dental exams once or twice a year. Routine eye exams. Ask your health care provider how often you should have your eyes checked. Personal lifestyle choices, including: Daily care of your teeth and gums. Regular physical activity. Eating a healthy diet. Avoiding tobacco and drug use. Limiting alcohol use. Practicing safe sex. Taking low doses of aspirin every day. Taking vitamin and mineral supplements as recommended by your health care provider. What happens during an annual well check? The services and screenings done by your health care provider during your annual well check will depend on your age, overall health, lifestyle risk factors, and  family history of disease. Counseling  Your health care provider may ask you questions about your: Alcohol use. Tobacco use. Drug use. Emotional well-being. Home and relationship well-being. Sexual activity. Eating habits. History of falls. Memory and ability to understand (cognition). Work and work Statistician. Screening  You may have the following tests or measurements: Height, weight, and BMI. Blood pressure. Lipid and cholesterol levels. These may be checked every 5 years, or more frequently if you are over 66 years old. Skin check. Lung cancer screening. You may have this screening every year starting at age 26 if you have a 30-pack-year history of smoking and currently smoke or have quit within the past 15 years. Fecal occult blood test (FOBT) of the stool. You may have this test every year starting at age 70. Flexible sigmoidoscopy or colonoscopy. You may have a sigmoidoscopy every 5 years or a colonoscopy every 10 years starting at age 60. Prostate cancer screening. Recommendations will vary depending on your family history and other risks. Hepatitis C blood test. Hepatitis B blood test. Sexually transmitted disease (STD) testing. Diabetes screening. This is done by checking your blood sugar (glucose) after you have not eaten for a while (fasting). You may have this done every 1-3 years. Abdominal aortic aneurysm (AAA) screening. You may need this if you are a current or former smoker. Osteoporosis. You may be screened starting at age 79 if you are at high risk. Talk with your health care provider about your test results, treatment options, and if necessary, the need for more tests. Vaccines  Your health care provider may recommend certain vaccines, such as: Influenza vaccine. This is recommended every year. Tetanus, diphtheria, and acellular pertussis (Tdap, Td) vaccine. You may need a Td booster every 10 years. Zoster vaccine.  You may need this after age 50. Pneumococcal  13-valent conjugate (PCV13) vaccine. One dose is recommended after age 55. Pneumococcal polysaccharide (PPSV23) vaccine. One dose is recommended after age 44. Talk to your health care provider about which screenings and vaccines you need and how often you need them. This information is not intended to replace advice given to you by your health care provider. Make sure you discuss any questions you have with your health care provider. Document Released: 06/26/2015 Document Revised: 02/17/2016 Document Reviewed: 03/31/2015 Elsevier Interactive Patient Education  2017 Micheal Walls Prevention in the Home Falls can cause injuries. They can happen to people of all ages. There are many things you can do to make your home safe and to help prevent falls. What can I do on the outside of my home? Regularly fix the edges of walkways and driveways and fix any cracks. Remove anything that might make you trip as you walk through a door, such as a raised step or threshold. Trim any bushes or trees on the path to your home. Use bright outdoor lighting. Clear any walking paths of anything that might make someone trip, such as rocks or tools. Regularly check to see if handrails are loose or broken. Make sure that both sides of any steps have handrails. Any raised decks and porches should have guardrails on the edges. Have any leaves, snow, or ice cleared regularly. Use sand or salt on walking paths during winter. Clean up any spills in your garage right away. This includes oil or grease spills. What can I do in the bathroom? Use night lights. Install grab bars by the toilet and in the tub and shower. Do not use towel bars as grab bars. Use non-skid mats or decals in the tub or shower. If you need to sit down in the shower, use a plastic, non-slip stool. Keep the floor dry. Clean up any water that spills on the floor as soon as it happens. Remove soap buildup in the tub or shower regularly. Attach  bath mats securely with double-sided non-slip rug tape. Do not have throw rugs and other things on the floor that can make you trip. What can I do in the bedroom? Use night lights. Make sure that you have a light by your bed that is easy to reach. Do not use any sheets or blankets that are too big for your bed. They should not hang down onto the floor. Have a firm chair that has side arms. You can use this for support while you get dressed. Do not have throw rugs and other things on the floor that can make you trip. What can I do in the kitchen? Clean up any spills right away. Avoid walking on wet floors. Keep items that you use a lot in easy-to-reach places. If you need to reach something above you, use a strong step stool that has a grab bar. Keep electrical cords out of the way. Do not use floor polish or wax that makes floors slippery. If you must use wax, use non-skid floor wax. Do not have throw rugs and other things on the floor that can make you trip. What can I do with my stairs? Do not leave any items on the stairs. Make sure that there are handrails on both sides of the stairs and use them. Fix handrails that are broken or loose. Make sure that handrails are as long as the stairways. Check any carpeting to make sure that it is  firmly attached to the stairs. Fix any carpet that is loose or worn. Avoid having throw rugs at the top or bottom of the stairs. If you do have throw rugs, attach them to the floor with carpet tape. Make sure that you have a light switch at the top of the stairs and the bottom of the stairs. If you do not have them, ask someone to add them for you. What else can I do to help prevent falls? Wear shoes that: Do not have high heels. Have rubber bottoms. Are comfortable and fit you well. Are closed at the toe. Do not wear sandals. If you use a stepladder: Make sure that it is fully opened. Do not climb a closed stepladder. Make sure that both sides of the  stepladder are locked into place. Ask someone to hold it for you, if possible. Clearly mark and make sure that you can see: Any grab bars or handrails. First and last steps. Where the edge of each step is. Use tools that help you move around (mobility aids) if they are needed. These include: Canes. Walkers. Scooters. Crutches. Turn on the lights when you go into a dark area. Replace any light bulbs as soon as they burn out. Set up your furniture so you have a clear path. Avoid moving your furniture around. If any of your floors are uneven, fix them. If there are any pets around you, be aware of where they are. Review your medicines with your doctor. Some medicines can make you feel dizzy. This can increase your chance of falling. Ask your doctor what other things that you can do to help prevent falls. This information is not intended to replace advice given to you by your health care provider. Make sure you discuss any questions you have with your health care provider. Document Released: 03/26/2009 Document Revised: 11/05/2015 Document Reviewed: 07/04/2014 Elsevier Interactive Patient Education  2017 [employer].

## 2021-05-26 ENCOUNTER — Ambulatory Visit (INDEPENDENT_AMBULATORY_CARE_PROVIDER_SITE_OTHER): Payer: Medicare Other

## 2021-05-26 DIAGNOSIS — Z794 Long term (current) use of insulin: Secondary | ICD-10-CM

## 2021-05-26 DIAGNOSIS — N1831 Chronic kidney disease, stage 3a: Secondary | ICD-10-CM

## 2021-05-26 DIAGNOSIS — E1159 Type 2 diabetes mellitus with other circulatory complications: Secondary | ICD-10-CM

## 2021-05-26 DIAGNOSIS — I739 Peripheral vascular disease, unspecified: Secondary | ICD-10-CM

## 2021-05-26 NOTE — Patient Instructions (Signed)
Social Worker Visit Information  Goals we discussed today:   Goals Addressed             This Visit's Progress    Mobility and Independence Optimized       Timeframe:  Long-Range Goal Priority:  High Start Date:  3.14.22                                         Next planned outreach: 1.4.23  Patient Goals/Self-Care Activities  patient will: With the help of his children  - Continue to receive mobile meals - drivers are to call patient prior to arriving so he may prepare to answer the door -Contact SW as needed prior to next scheduled call         Materials Provided: Yes: verbal and written education about [facility]  Patient verbalizes understanding of instructions provided today and agrees to view in [place].   Follow Up Plan: SW will follow up with patient by phone over the next 45 days   [name], [name], [name], Certified Dementia Practitioner [place] [facility] [place] [facility] [phone]

## 2021-05-26 NOTE — Chronic Care Management (AMB) (Signed)
Chronic Care Management    Social Work Note  05/26/2021 Name: Micheal Walls MRN: [id number] DOB: [birth year]  Micheal Walls is a 85 y.o. year old male who is a primary care patient of [name], [place]. The CCM team was consulted to assist the patient with chronic disease management and/or care coordination needs related to:  PAD and DM II .   Engaged with [name] by phone  for follow up visit in response to provider referral for social work chronic care management and care coordination services.   Consent to Services:  The patient was given information about Chronic Care Management services, agreed to services, and gave verbal consent prior to initiation of services.  Please see initial visit note for detailed documentation.   Patient agreed to services and consent obtained.   Assessment: Review of patient past medical history, allergies, medications, and health status, including review of relevant consultants reports was performed today as part of a comprehensive evaluation and provision of chronic care management and care coordination services.     SDOH (Social Determinants of Health) assessments and interventions performed:    Advanced Directives Status: Not addressed in this encounter.  CCM Care Plan  Allergies  Allergen Reactions   Vancomycin Rash    Outpatient Encounter Medications as of 05/26/2021  Medication Sig   allopurinol (ZYLOPRIM) 100 MG tablet Take 100 mg by mouth 2 (two) times daily.   aspirin EC 81 MG tablet Take 81 mg by mouth daily. Swallow whole.   colchicine 0.6 MG tablet Take 0.6 mg by mouth once a week.   Continuous Blood Gluc Sensor (FREESTYLE LIBRE 14 DAY SENSOR) MISC Use as directed to check blood sugars   diltiazem (CARDIZEM CD) 240 MG 24 hr capsule Take 1 capsule (240 mg total) by mouth daily.   dorzolamide (TRUSOPT) 2 % ophthalmic solution Place 1 drop into both eyes 2 (two) times daily.   ferrous sulfate 325 (65 FE) MG  tablet Take 325 mg by mouth 2 (two) times daily with a meal.   furosemide (LASIX) 40 MG tablet Take 2 tablets in the morning and one in the evening   gabapentin (NEURONTIN) 100 MG capsule TAKE TWO CAPSULES BY MOUTH EVERY MORNING and TAKE ONE CAPSULE BY MOUTH AT NOON and TAKE ONE CAPSULE BY MOUTH EVERY EVENING   gentamicin cream (GARAMYCIN) 0.1 % Apply 1 application topically daily. To foot wound with dressing changes   glucose blood (FREESTYLE PRECISION NEO TEST) test strip Use as instructed   ketotifen (ZADITOR) 0.025 % ophthalmic solution Place 1 drop into both eyes 2 (two) times daily.   latanoprost (XALATAN) 0.005 % ophthalmic solution Place 1 drop into both eyes at bedtime.    Multiple Vitamins-Minerals (MULTIVITAMIN WITH MINERALS) tablet Take 1 tablet by mouth daily. Centrum men 50+   Multiple Vitamins-Minerals (PRESERVISION AREDS) CAPS Take 1 capsule by mouth 2 (two) times daily.   pantoprazole (PROTONIX) 40 MG tablet Take 1 tablet (40 mg total) by mouth daily.   Polyvinyl Alcohol-Povidone PF (REFRESH) 1.4-0.6 % SOLN Place 1 drop into both eyes 2 (two) times daily.   rosuvastatin (CRESTOR) 20 MG tablet Take 1 tablet (20 mg total) by mouth daily.   Semaglutide, [name], (OZEMPIC, [name],) 2 MG/1.5ML SOPN Inject 1 mg into the skin once a week.   senna-docusate (SENOKOT-S) 8.6-50 MG tablet Take 1 tablet by mouth 2 (two) times daily between meals as needed for mild constipation or moderate constipation. (Patient taking differently: Take 1  tablet by mouth 2 (two) times daily.)   SYNTHROID 25 MCG tablet Take 1 tablet by mouth Monday - Friday   tamsulosin (FLOMAX) 0.4 MG CAPS capsule Take 0.4 mg by mouth daily.   No facility-administered encounter medications on file as of 05/26/2021.    Patient Active Problem List   Diagnosis Date Noted   Pseudophakia of both eyes 04/12/2021   Infection of amputation stump of left lower extremity ([place]) 10/21/2020   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[place])     Left ankle swelling    Diabetic foot infection ([place])    Cerebral thrombosis with cerebral infarction 07/20/2020   Thrombocytopenia ([place]) 07/19/2020   Cerebral ischemia 07/18/2020   Bilateral hearing loss 05/20/2020   Does use hearing aid 05/20/2020   Ulcer of left foot with fat layer exposed ([place]) 01/16/2020   Controlled type 2 diabetes mellitus with stable proliferative retinopathy of both eyes, with long-term current use of insulin ([place]) 01/06/2020   Intermediate stage nonexudative age-related macular degeneration of both eyes 01/06/2020   Left epiretinal membrane 01/06/2020   Drusen of right macula 01/06/2020   AKI (acute kidney injury) ([place]) 11/02/2019   Cellulitis 11/02/2019   Elevated TSH 07/10/2018   Nephropathy 02/28/2018   Disturbance of skin sensation 02/28/2018   PAD (peripheral artery disease) ([place]) 07/17/2017   110.1 10/21/2013   Epiphora 04/24/2013   Laxity of eyelid 04/24/2013   Anemia 04/09/2013   History of peripheral vascular disease 04/09/2013   History of stroke 04/09/2013   S/P unilateral BKA (below knee amputation) ([place]) 03/17/2013   Diabetes mellitus type 2 in obese ([place]) 03/17/2013   Mixed hyperlipidemia 03/17/2013   Essential hypertension 03/17/2013   Gout 03/17/2013   CKD (chronic kidney disease), stage III ([place]) 03/17/2013   OSA on CPAP 03/17/2013   Anemia, iron deficiency 03/17/2013   Diverticulosis of colon 03/17/2013   RBBB 03/17/2013   Punctal stenosis, acquired 10/02/2012    Conditions to be addressed/monitored: DMII and PAD ; Limited access to caregiver  Care Plan : Social Work [facility] Plan of Care  Updates made by [name] since 05/26/2021 12:00 AM     Problem: Mobility and Independence      Long-Range Goal: Mobility and Independence Optimized   Start Date: 08/24/2020  Recent Progress: On track  Priority: High  Note:   Current Barriers:  Chronic disease management support and education needs related to DM and PAD   Limited  access to caregiver Unable to independently prepare meals  Social Worker Clinical Goal(s):  patient will work with SW to identify and address any acute and/or chronic care coordination needs related to the self health management of DM and PAD   the patient and his daughter will work with SW to identify caregiver resources for in the home  the patient will be placed on the wait list for mobile meals Goal Met  CCM SW Interventions: Completed with the [name] Inter-disciplinary care team collaboration (see longitudinal plan of care) Collaboration with [name], [place] regarding development and update of comprehensive plan of care as evidenced by provider attestation and co-signature Successful outbound call placed to the patients daughter to assess goal progression Discussed the patient is doing ok in the home - family is interested in determining patients place on in home aid wait list Collaboration with Inda Merlin with the in home aid program to determine the patient is #48 on the wait list Discussed the wait list normally moves slow because once awarded services the  patient will keep services until they are no longer needed  Reviewed the patients family will need to have a family meeting to determine if placement will be needed for the patient or not Provided verbal and written education on the [facility] program Discussed plans for SW to contact Micheal Walls over the next 45 days  Patient Goals/Self-Care Activities  patient will: With the help of his children  - Continue to receive mobile meals - drivers are to call patient prior to arriving so he may prepare to answer the door -Contact SW as needed prior to next scheduled call  Follow Up Plan:  SW will follow up with the patient over the next 45 days         Follow Up Plan: SW will follow up with patient by phone over the next 45 days.      [name], [name], [name], Certified Dementia  Practitioner [place] / [place] [facility] [phone]

## 2021-06-02 ENCOUNTER — Encounter: Payer: Self-pay | Admitting: Nurse Practitioner

## 2021-06-02 ENCOUNTER — Encounter: Payer: Self-pay | Admitting: Podiatry

## 2021-06-08 ENCOUNTER — Ambulatory Visit (INDEPENDENT_AMBULATORY_CARE_PROVIDER_SITE_OTHER): Payer: Medicare Other | Admitting: Podiatry

## 2021-06-08 ENCOUNTER — Other Ambulatory Visit: Payer: Self-pay

## 2021-06-08 DIAGNOSIS — L97522 Non-pressure chronic ulcer of other part of left foot with fat layer exposed: Secondary | ICD-10-CM | POA: Diagnosis not present

## 2021-06-08 DIAGNOSIS — I739 Peripheral vascular disease, unspecified: Secondary | ICD-10-CM

## 2021-06-08 DIAGNOSIS — E1151 Type 2 diabetes mellitus with diabetic peripheral angiopathy without gangrene: Secondary | ICD-10-CM

## 2021-06-08 NOTE — Progress Notes (Signed)
°  Subjective:  Patient ID: Micheal Walls, male    DOB: 1935-09-15,  MRN: [id number]  Chief Complaint  Patient presents with   Foot Ulcer    Wound care left foot     DOS: 09/14/2020 Procedure: Left hallux amputation  85 y.o. male returns for post-op check.  Thinks it still doing well he has been using the gentamicin cream  Review of Systems: Negative except as noted in the HPI. Denies N/V/F/Ch.   Objective:   There were no vitals filed for this visit.  There is no height or weight on file to calculate BMI. Constitutional Well developed. Well nourished.  Vascular Foot warm and well perfused. Capillary refill normal to all digits.   Neurologic Normal speech. Oriented to person, place, and time. Epicritic sensation to light touch grossly present bilaterally.  Dermatologic Prior first toe amputation incision dehiscence has completely healed now.   lateral foot ulceration full-thickness with exposed subcutaneous tissues measure 1.0 x 0.6 x 0.3 cm  Orthopedic:  Minimal tenderness to palpation noted about the surgical site.      ABI 0.7  Assessment:   1. Ulcer of left foot with fat layer exposed ([place])   2. PAD (peripheral artery disease) ([place])   3. Type II diabetes mellitus with peripheral circulatory disorder [facility])          Plan:  Patient was evaluated and treated and all questions answered.  Ulcer left foot -Hallux amputation site remains healed -We discussed the etiology and factors that are a part of the wound healing process.  We also discussed the risk of infection both soft tissue and osteomyelitis from open ulceration.  Discussed the risk of limb loss if this happens or worsens. -Debridement as below. -Dressed with Iodosorb, DSD. -Continue home dressing changes daily with gauze and gentamicin cream after cleaning with wound cleanser spray -Wound cleanser spray was dispensed today so he may continue to care for this at home  Procedure: Excisional  Debridement of Wound Rationale: Removal of non-viable soft tissue from the wound to promote healing.  Anesthesia: none Post-Debridement Wound Measurements: 1.0 cm x 0.6 cm x 0.3 cm  Type of Debridement: Sharp Excisional Tissue Removed: Non-viable soft tissue Depth of Debridement: subcutaneous tissue. Technique: Sharp excisional debridement to bleeding, viable wound base.  Dressing: Dry, sterile, compression dressing. Disposition: Patient tolerated procedure well.             Return in about 3 weeks (around 06/29/2021) for wound care.

## 2021-06-12 DIAGNOSIS — E1159 Type 2 diabetes mellitus with other circulatory complications: Secondary | ICD-10-CM

## 2021-06-12 DIAGNOSIS — Z794 Long term (current) use of insulin: Secondary | ICD-10-CM

## 2021-06-12 DIAGNOSIS — N1831 Chronic kidney disease, stage 3a: Secondary | ICD-10-CM

## 2021-06-16 ENCOUNTER — Ambulatory Visit (INDEPENDENT_AMBULATORY_CARE_PROVIDER_SITE_OTHER): Payer: Medicare Other

## 2021-06-16 DIAGNOSIS — N3289 Other specified disorders of bladder: Secondary | ICD-10-CM | POA: Diagnosis not present

## 2021-06-16 DIAGNOSIS — M109 Gout, unspecified: Secondary | ICD-10-CM | POA: Diagnosis not present

## 2021-06-16 DIAGNOSIS — Z794 Long term (current) use of insulin: Secondary | ICD-10-CM

## 2021-06-16 DIAGNOSIS — E785 Hyperlipidemia, unspecified: Secondary | ICD-10-CM | POA: Diagnosis not present

## 2021-06-16 DIAGNOSIS — E118 Type 2 diabetes mellitus with unspecified complications: Secondary | ICD-10-CM | POA: Diagnosis not present

## 2021-06-16 DIAGNOSIS — I129 Hypertensive chronic kidney disease with stage 1 through stage 4 chronic kidney disease, or unspecified chronic kidney disease: Secondary | ICD-10-CM | POA: Diagnosis not present

## 2021-06-16 DIAGNOSIS — N189 Chronic kidney disease, unspecified: Secondary | ICD-10-CM | POA: Diagnosis not present

## 2021-06-16 DIAGNOSIS — Z6841 Body Mass Index (BMI) 40.0 and over, adult: Secondary | ICD-10-CM | POA: Diagnosis not present

## 2021-06-16 DIAGNOSIS — N183 Chronic kidney disease, stage 3 unspecified: Secondary | ICD-10-CM | POA: Diagnosis not present

## 2021-06-16 DIAGNOSIS — I739 Peripheral vascular disease, unspecified: Secondary | ICD-10-CM

## 2021-06-16 NOTE — Chronic Care Management (AMB) (Signed)
Chronic Care Management    Social Work Note  06/16/2021 Name: Micheal Walls MRN: [id number] DOB: [birth year]  Micheal Walls is a 86 y.o. year old male who is a primary care patient of [name], [place]. The CCM team was consulted to assist the patient with chronic disease management and/or care coordination needs related to:  PAD, DM II .   Engaged with patients daughter and caregiver by phone  for follow up visit in response to provider referral for social work chronic care management and care coordination services.   Consent to Services:  The patient was given information about Chronic Care Management services, agreed to services, and gave verbal consent prior to initiation of services.  Please see initial visit note for detailed documentation.   Patient agreed to services and consent obtained.   Assessment: Review of patient past medical history, allergies, medications, and health status, including review of relevant consultants reports was performed today as part of a comprehensive evaluation and provision of chronic care management and care coordination services.     SDOH (Social Determinants of Health) assessments and interventions performed:    Advanced Directives Status: Not addressed in this encounter.  CCM Care Plan  Allergies  Allergen Reactions   Vancomycin Rash    Outpatient Encounter Medications as of 06/16/2021  Medication Sig   allopurinol (ZYLOPRIM) 100 MG tablet Take 100 mg by mouth 2 (two) times Walls.   aspirin EC 81 MG tablet Take 81 mg by mouth Walls. Swallow whole.   colchicine 0.6 MG tablet Take 0.6 mg by mouth once a week.   Continuous Blood Gluc Sensor (FREESTYLE LIBRE 14 DAY SENSOR) MISC Use as directed to check blood sugars   diltiazem (CARDIZEM CD) 240 MG 24 hr capsule Take 1 capsule (240 mg total) by mouth Walls.   dorzolamide (TRUSOPT) 2 % ophthalmic solution Place 1 drop into both eyes 2 (two) times Walls.   ferrous sulfate 325 (65 FE) MG  tablet Take 325 mg by mouth 2 (two) times Walls with a meal.   furosemide (LASIX) 40 MG tablet Take 2 tablets in the morning and one in the evening   gabapentin (NEURONTIN) 100 MG capsule TAKE TWO CAPSULES BY MOUTH EVERY MORNING and TAKE ONE CAPSULE BY MOUTH AT NOON and TAKE ONE CAPSULE BY MOUTH EVERY EVENING   gentamicin cream (GARAMYCIN) 0.1 % Apply 1 application topically Walls. To foot wound with dressing changes   glucose blood (FREESTYLE PRECISION NEO TEST) test strip Use as instructed   ketotifen (ZADITOR) 0.025 % ophthalmic solution Place 1 drop into both eyes 2 (two) times Walls.   latanoprost (XALATAN) 0.005 % ophthalmic solution Place 1 drop into both eyes at bedtime.    Multiple Vitamins-Minerals (MULTIVITAMIN WITH MINERALS) tablet Take 1 tablet by mouth Walls. Centrum men 50+   Multiple Vitamins-Minerals (PRESERVISION AREDS) CAPS Take 1 capsule by mouth 2 (two) times Walls.   pantoprazole (PROTONIX) 40 MG tablet Take 1 tablet (40 mg total) by mouth Walls.   Polyvinyl Alcohol-Povidone PF (REFRESH) 1.4-0.6 % SOLN Place 1 drop into both eyes 2 (two) times Walls.   rosuvastatin (CRESTOR) 20 MG tablet Take 1 tablet (20 mg total) by mouth Walls.   Semaglutide, [name], (OZEMPIC, [name],) 2 MG/1.5ML SOPN Inject 1 mg into the skin once a week.   senna-docusate (SENOKOT-S) 8.6-50 MG tablet Take 1 tablet by mouth 2 (two) times Walls between meals as needed for mild constipation or moderate constipation. (Patient taking differently: Take 1  tablet by mouth 2 (two) times Walls.)   SYNTHROID 25 MCG tablet Take 1 tablet by mouth Monday - Friday   tamsulosin (FLOMAX) 0.4 MG CAPS capsule Take 0.4 mg by mouth Walls.   No facility-administered encounter medications on file as of 06/16/2021.    Patient Active Problem List   Diagnosis Date Noted   Pseudophakia of both eyes 04/12/2021   Infection of amputation stump of left lower extremity ([place]) 10/21/2020   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[place])    Left  ankle swelling    Diabetic foot infection ([place])    Cerebral thrombosis with cerebral infarction 07/20/2020   Thrombocytopenia ([place]) 07/19/2020   Cerebral ischemia 07/18/2020   Bilateral hearing loss 05/20/2020   Does use hearing aid 05/20/2020   Ulcer of left foot with fat layer exposed ([place]) 01/16/2020   Controlled type 2 diabetes mellitus with stable proliferative retinopathy of both eyes, with long-term current use of insulin ([place]) 01/06/2020   Intermediate stage nonexudative age-related macular degeneration of both eyes 01/06/2020   Left epiretinal membrane 01/06/2020   Drusen of right macula 01/06/2020   AKI (acute kidney injury) ([place]) 11/02/2019   Cellulitis 11/02/2019   Elevated TSH 07/10/2018   Nephropathy 02/28/2018   Disturbance of skin sensation 02/28/2018   PAD (peripheral artery disease) ([place]) 07/17/2017   110.1 10/21/2013   Epiphora 04/24/2013   Laxity of eyelid 04/24/2013   Anemia 04/09/2013   History of peripheral vascular disease 04/09/2013   History of stroke 04/09/2013   S/P unilateral BKA (below knee amputation) ([place]) 03/17/2013   Diabetes mellitus type 2 in obese ([place]) 03/17/2013   Mixed hyperlipidemia 03/17/2013   Essential hypertension 03/17/2013   Gout 03/17/2013   CKD (chronic kidney disease), stage III ([place]) 03/17/2013   OSA on CPAP 03/17/2013   Anemia, iron deficiency 03/17/2013   Diverticulosis of colon 03/17/2013   RBBB 03/17/2013   Punctal stenosis, acquired 10/02/2012    Conditions to be addressed/monitored: DMII and PAD  Care Plan : Social Work [facility] Plan of Care  Updates made by [name] since 06/16/2021 12:00 AM     Problem: Mobility and Independence      Long-Range Goal: Mobility and Independence Optimized   Start Date: 08/24/2020  This Visit's Progress: On track  Recent Progress: On track  Priority: High  Note:   Current Barriers:  Chronic disease management support and education needs related to DM and PAD   Limited access  to caregiver Unable to independently prepare meals  Social Worker Clinical Goal(s):  patient will work with SW to identify and address any acute and/or chronic care coordination needs related to the self health management of DM and PAD   the patient and his daughter will work with SW to identify caregiver resources for in the home  the patient will be placed on the wait list for mobile meals Goal Met  CCM SW Interventions: Completed with the patients daughter [name] Inter-disciplinary care team collaboration (see longitudinal plan of care) Collaboration with [name], [place] regarding development and update of comprehensive plan of care as evidenced by provider attestation and co-signature Successful outbound call placed to the patients daughter to assess care coordination needs Confirmed receipt of [facility] resource - at this time family does not feel this is a good option for the patient Reviewed patient is doing well in the home and continues to receive mobile meals Discussed patient is nervous regarding outcome of upcomming appointment to determine if another amputation will  be needed Scheduled follow up call over the next 45 days  Patient Goals/Self-Care Activities  patient will: With the help of his children  - Continue to receive mobile meals - drivers are to call patient prior to arriving so he may prepare to answer the door -Contact SW as needed prior to next scheduled call  Follow Up Plan:  SW will follow up with the patient over the next 45 days         Follow Up Plan: SW will follow up with patient by phone over the next 45 days      [name], [name], [name], Certified Dementia Practitioner [place] / [place] [facility] [phone]

## 2021-06-16 NOTE — Patient Instructions (Signed)
Social Worker Visit Information  Goals we discussed today:   Goals Addressed             This Visit's Progress    Mobility and Independence Optimized   On track    Timeframe:  Long-Range Goal Priority:  High Start Date:  3.14.22                                         Next planned outreach: 2.20.23  Patient Goals/Self-Care Activities  patient will: With the help of his children  - Continue to receive mobile meals - drivers are to call patient prior to arriving so he may prepare to answer the door -Contact SW as needed prior to next scheduled call         Patient verbalizes understanding of instructions provided today and agrees to view in [place].   Follow Up Plan: SW will follow up with patient by phone over the next 45 days   [name], [name], [name], Certified Dementia Practitioner [place] / [place] [facility] [phone]

## 2021-06-22 ENCOUNTER — Other Ambulatory Visit: Payer: Self-pay | Admitting: Nurse Practitioner

## 2021-06-22 ENCOUNTER — Other Ambulatory Visit: Payer: Medicare Other

## 2021-06-22 ENCOUNTER — Other Ambulatory Visit: Payer: Self-pay

## 2021-06-22 DIAGNOSIS — Z23 Encounter for immunization: Secondary | ICD-10-CM | POA: Diagnosis not present

## 2021-06-22 DIAGNOSIS — E039 Hypothyroidism, unspecified: Secondary | ICD-10-CM

## 2021-06-23 LAB — T4: T4, Total: 5.5 ug/dL (ref 4.5–12.0)

## 2021-06-23 LAB — T3, FREE: T3, Free: 2 pg/mL (ref 2.0–4.4)

## 2021-06-23 LAB — TSH: TSH: 2.55 u[IU]/mL (ref 0.450–4.500)

## 2021-06-24 ENCOUNTER — Encounter (INDEPENDENT_AMBULATORY_CARE_PROVIDER_SITE_OTHER): Payer: Self-pay

## 2021-06-24 ENCOUNTER — Encounter: Payer: Self-pay | Admitting: Nurse Practitioner

## 2021-06-24 ENCOUNTER — Ambulatory Visit: Payer: Medicare Other | Admitting: Vascular Surgery

## 2021-06-24 DIAGNOSIS — H401132 Primary open-angle glaucoma, bilateral, moderate stage: Secondary | ICD-10-CM | POA: Diagnosis not present

## 2021-06-24 LAB — HM DIABETES EYE EXAM

## 2021-06-25 ENCOUNTER — Encounter: Payer: Self-pay | Admitting: Nurse Practitioner

## 2021-06-28 ENCOUNTER — Telehealth: Payer: Self-pay

## 2021-06-28 ENCOUNTER — Other Ambulatory Visit: Payer: Self-pay

## 2021-06-28 ENCOUNTER — Ambulatory Visit (INDEPENDENT_AMBULATORY_CARE_PROVIDER_SITE_OTHER): Payer: Medicare Other | Admitting: Podiatry

## 2021-06-28 DIAGNOSIS — B351 Tinea unguium: Secondary | ICD-10-CM

## 2021-06-28 DIAGNOSIS — L97522 Non-pressure chronic ulcer of other part of left foot with fat layer exposed: Secondary | ICD-10-CM

## 2021-06-28 DIAGNOSIS — M79674 Pain in right toe(s): Secondary | ICD-10-CM | POA: Diagnosis not present

## 2021-06-28 DIAGNOSIS — M79675 Pain in left toe(s): Secondary | ICD-10-CM | POA: Diagnosis not present

## 2021-06-28 NOTE — Progress Notes (Signed)
°  Subjective:  Patient ID: Micheal Walls, male    DOB: [birth year],  MRN: [id number]  Chief Complaint  Patient presents with   Foot Ulcer    Follow up ulcer left foot     DOS: 09/14/2020 Procedure: Left hallux amputation  86 y.o. male returns for post-op check.  Thinks it still doing well he has been using the gentamicin cream.  His nails are thickened elongated and difficult to take care of  Review of Systems: Negative except as noted in the HPI. Denies N/V/F/Ch.   Objective:   There were no vitals filed for this visit.  There is no height or weight on file to calculate BMI. Constitutional Well developed. Well nourished.  Vascular Foot warm and well perfused. Capillary refill normal to all digits.   Neurologic Normal speech. Oriented to person, place, and time. Epicritic sensation to light touch grossly present bilaterally.  Dermatologic Prior first toe amputation incision dehiscence has completely healed now.   lateral foot ulceration full-thickness with exposed subcutaneous tissues measure 0.8 and 0.7x0.2.  For toenails thickened elongated brown-yellow discoloration in the left foot  Orthopedic:  Minimal tenderness to palpation noted about the surgical site.       ABI 0.7  Assessment:   1. Ulcer of left foot with fat layer exposed ([place])   2. Pain due to onychomycosis of toenails of both feet          Plan:  Patient was evaluated and treated and all questions answered.  Discussed the etiology and treatment options for the condition in detail with the patient. Educated patient on the topical and oral treatment options for mycotic nails. Recommended debridement of the nails today. Sharp and mechanical debridement performed of all painful and mycotic nails today. Nails debrided in length and thickness using a nail nipper to level of comfort. Discussed treatment options including appropriate shoe gear. Follow up as needed for painful nails.   Ulcer left  foot -Hallux amputation site remains healed -We discussed the etiology and factors that are a part of the wound healing process.  We also discussed the risk of infection both soft tissue and osteomyelitis from open ulceration.  Discussed the risk of limb loss if this happens or worsens. -Debridement as below. -Dressed with Iodosorb, DSD. -Continue home dressing changes daily with gauze and gentamicin cream after cleaning with wound cleanser spray   Procedure: Excisional Debridement of Wound Rationale: Removal of non-viable soft tissue from the wound to promote healing.  Anesthesia: none Post-Debridement Wound Measurements: 0.8 x 0.7 x 0.2 cm Type of Debridement: Sharp Excisional Tissue Removed: Non-viable soft tissue Depth of Debridement: subcutaneous tissue. Technique: Sharp excisional debridement to bleeding, viable wound base.  Dressing: Dry, sterile, compression dressing. Disposition: Patient tolerated procedure well.             No follow-ups on file.

## 2021-06-28 NOTE — Chronic Care Management (AMB) (Addendum)
°  Micheal Walls was reminded to have all medications, supplements and any blood glucose and blood pressure readings available for review with [name], Pharm. D, at his telephone visit on 06-30-2021 at 12:00.    Questions: Have you had any recent office visit or specialist visit outside of [facility]? Patient stated he went to the kidney doctor on 06-16-2021  Are there any concerns you would like to discuss during your office visit? Patient stated no.  Are you having any problems obtaining your medications? (Whether it pharmacy issues or cost) Patient stated no  If patient has any PAP medications ask if they are having any problems getting their PAP medication or refill? No   Care Gaps: AWV 05-20-2021  Star Rating Drug: Rosuvastatin 20 mg- Last filled 05-20-2021 90 DS Upstream Ozempic 1 mg- Last filled 01-27-2021 90 DS Elixer (Contacted pharmacy and last filled on 01-27-2021. Patient states he has 2 pens on hand)  Any gaps in medications fill history? No  06-30-2021: Left patient a voicemail stating that [name] CPP will need to reschedule 06-30-2021 appointment to 07-09-2020 at 12:00. Will reach out again to patient next week.  07-06-2021: Left patient a voicemail with appointment reminder with [name] CPP on 07-09-2020 at 12:00.  [place] Pharmacist Assistant [phone]

## 2021-06-30 ENCOUNTER — Telehealth: Payer: Medicare Other

## 2021-07-01 ENCOUNTER — Encounter: Payer: Self-pay | Admitting: Vascular Surgery

## 2021-07-01 ENCOUNTER — Ambulatory Visit (INDEPENDENT_AMBULATORY_CARE_PROVIDER_SITE_OTHER): Payer: Medicare Other | Admitting: Vascular Surgery

## 2021-07-01 ENCOUNTER — Other Ambulatory Visit: Payer: Self-pay

## 2021-07-01 VITALS — BP 136/69 | HR 75 | Temp 98.0°F | Resp 20 | Ht 73.0 in | Wt 248.0 lb

## 2021-07-01 DIAGNOSIS — I70299 Other atherosclerosis of native arteries of extremities, unspecified extremity: Secondary | ICD-10-CM

## 2021-07-01 DIAGNOSIS — I872 Venous insufficiency (chronic) (peripheral): Secondary | ICD-10-CM

## 2021-07-01 DIAGNOSIS — L97909 Non-pressure chronic ulcer of unspecified part of unspecified lower leg with unspecified severity: Secondary | ICD-10-CM | POA: Diagnosis not present

## 2021-07-01 DIAGNOSIS — I739 Peripheral vascular disease, unspecified: Secondary | ICD-10-CM

## 2021-07-01 NOTE — Progress Notes (Signed)
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  MEDICAL ISSUES:   PERIPHERAL ARTERIAL DISEASE: This patient is undergone a previous arteriogram which shows that he has tibial artery occlusive disease with no options for revascularization.  He had a small wound on his left great toe and I was concerned this would not heal.  However, that wound has healed.  He has now developed a small wound on the lateral aspect of his foot.  He will continue with elevation and some mild compression therapy by the wound care center.  They are following his wound.  I will plan on seeing him back in 6 months for ABIs of the left leg.  He knows to call sooner if he has problems   HPI:   Micheal Walls is a pleasant 86 y.o. male who I last saw on 03/11/2021 with combined chronic venous insufficiency and peripheral arterial disease.  He had a nonhealing wound on his left great toe.  He underwent an arteriogram on 02/26/2021 by Dr. Stanford Breed.  This showed severe tibial artery occlusive disease with no options for revascularization.  It was felt that he would likely need a below the knee amputation.  When I saw him last, the wound on his toe was stable.  There was no erythema or drainage.  Of note he does have a prosthesis for his right below the knee amputation but he tells me that he is nonambulatory.  Thus we discussed also considering an above-the-knee amputation on the left if the wound progressed.  He wanted to get through the holidays before considering amputation.  He comes in for a follow-up visit to discuss this further.  Since I saw him last, the wound on the great toe has healed.  He has developed a small wound on the lateral aspect of his left foot.  This is fairly clean and at this point is fairly small.  He has a BKA on the right but has not been ambulating currently as the prosthesis is being adjusted.  He denies any rest pain of the left foot.  He has been elevating  his legs some in the wound care center has been using some mild compression.  Past Medical History:  Diagnosis Date   Acute metabolic encephalopathy 12/15/9161   Amputated below knee (HCC)    right   Anemia    Aspiration pneumonia ([place]) 8/46/6599   Diastolic heart failure (HCC)    Diverticulosis    DM (diabetes mellitus) ([place])    Gout    Hyperlipemia    OSA on CPAP    RBBB    Sepsis ([place]) 11/02/2019   Systemic hypertension     Family History  Problem Relation Age of Onset   Diabetes Mother    Heart attack Father    Cancer Sister     SOCIAL HISTORY: Social History   Tobacco Use   Smoking status: Former    Types: Cigars    Quit date: 06/12/2001    Years since quitting: 20.0   Smokeless tobacco: Never  Substance Use Topics   Alcohol use: No    Alcohol/week: 0.0 standard drinks    Allergies  Allergen Reactions   Vancomycin Rash    Current Outpatient Medications  Medication Sig Dispense Refill   allopurinol (ZYLOPRIM) 100 MG tablet Take 100 mg by mouth 2 (two) times daily.     aspirin EC 81 MG tablet Take 81 mg by mouth daily. Swallow  whole.     colchicine 0.6 MG tablet Take 0.6 mg by mouth once a week.     Continuous Blood Gluc Sensor (FREESTYLE LIBRE 14 DAY SENSOR) MISC Use as directed to check blood sugars 6 each 1   dorzolamide (TRUSOPT) 2 % ophthalmic solution Place 1 drop into both eyes 2 (two) times daily.     ferrous sulfate 325 (65 FE) MG tablet Take 325 mg by mouth 2 (two) times daily with a meal.     furosemide (LASIX) 40 MG tablet Take 2 tablets in the morning and one in the evening 180 tablet 2   gabapentin (NEURONTIN) 100 MG capsule TAKE TWO CAPSULES BY MOUTH EVERY MORNING and TAKE ONE CAPSULE BY MOUTH AT NOON and TAKE ONE CAPSULE BY MOUTH EVERY EVENING 270 capsule 0   gentamicin cream (GARAMYCIN) 0.1 % Apply 1 application topically daily. To foot wound with dressing changes 30 g 1   glucose blood (FREESTYLE PRECISION NEO TEST) test strip Use as  instructed 100 each 12   ketotifen (ZADITOR) 0.025 % ophthalmic solution Place 1 drop into both eyes 2 (two) times daily.     latanoprost (XALATAN) 0.005 % ophthalmic solution Place 1 drop into both eyes at bedtime.      Multiple Vitamins-Minerals (MULTIVITAMIN WITH MINERALS) tablet Take 1 tablet by mouth daily. Centrum men 50+     Multiple Vitamins-Minerals (PRESERVISION AREDS) CAPS Take 1 capsule by mouth 2 (two) times daily.     pantoprazole (PROTONIX) 40 MG tablet Take 1 tablet (40 mg total) by mouth daily. 90 tablet 1   Polyvinyl Alcohol-Povidone PF (REFRESH) 1.4-0.6 % SOLN Place 1 drop into both eyes 2 (two) times daily.     rosuvastatin (CRESTOR) 20 MG tablet Take 1 tablet (20 mg total) by mouth daily. 90 tablet 3   Semaglutide, [name], (OZEMPIC, [name],) 2 MG/1.5ML SOPN Inject 1 mg into the skin once a week. 9 mL 1   senna-docusate (SENOKOT-S) 8.6-50 MG tablet Take 1 tablet by mouth 2 (two) times daily between meals as needed for mild constipation or moderate constipation. (Patient taking differently: Take 1 tablet by mouth 2 (two) times daily.) 60 tablet 0   SYNTHROID 25 MCG tablet Take 1 tablet by mouth Monday - Friday 20 tablet 2   tamsulosin (FLOMAX) 0.4 MG CAPS capsule Take 0.4 mg by mouth daily.     diltiazem (CARDIZEM CD) 240 MG 24 hr capsule Take 1 capsule (240 mg total) by mouth daily. 90 capsule 3   No current facility-administered medications for this visit.    REVIEW OF SYSTEMS:  $RemoveB'[X]'AgAAdOzt$  denotes positive finding, $RemoveBeforeDEI'[ ]'TOoXbDxJhutjjCiM$  denotes negative finding Cardiac  Comments:  Chest pain or chest pressure:    Shortness of breath upon exertion:    Short of breath when lying flat:    Irregular heart rhythm:        Vascular    Pain in calf, thigh, or hip brought on by ambulation:    Pain in feet at night that wakes you up from your sleep:     Blood clot in your veins:    Leg swelling:  x       Pulmonary    Oxygen at home:    Productive cough:     Wheezing:         Neurologic     Sudden weakness in arms or legs:     Sudden numbness in arms or legs:     Sudden onset of difficulty speaking  or slurred speech:    Temporary loss of vision in one eye:     Problems with dizziness:         Gastrointestinal    Blood in stool:     Vomited blood:         Genitourinary    Burning when urinating:     Blood in urine:        Psychiatric    Major depression:         Hematologic    Bleeding problems:    Problems with blood clotting too easily:        Skin    Rashes or ulcers: x Lateral aspect left foot      Constitutional    Fever or chills:     PHYSICAL EXAM:   Vitals:   07/01/21 1038  BP: 136/69  Pulse: 75  Resp: 20  Temp: 98 F (36.7 C)  SpO2: 95%  Weight: 248 lb (112.5 kg)  Height: $Remove'6\' 1"'QWyvFOR$  (1.854 m)    GENERAL: The patient is a well-nourished male, in no acute distress. The vital signs are documented above. CARDIAC: There is a regular rate and rhythm.  VASCULAR: He has a palpable femoral and popliteal pulse on the left.  The left foot is warm and well-perfused with monophasic Doppler signals. PULMONARY: There is good air exchange bilaterally without wheezing or rales. MUSCULOSKELETAL: He has a right below the knee amputation NEUROLOGIC: No focal weakness or paresthesias are detected. SKIN: The wound on the left great toe has healed.  There is a wound on the lateral aspect of the foot now.     PSYCHIATRIC: The patient has a normal affect.  DATA:    No new data  Deitra Mayo [facility] [phone]

## 2021-07-02 ENCOUNTER — Ambulatory Visit: Payer: Self-pay

## 2021-07-02 ENCOUNTER — Telehealth: Payer: Medicare Other

## 2021-07-02 DIAGNOSIS — I739 Peripheral vascular disease, unspecified: Secondary | ICD-10-CM

## 2021-07-02 DIAGNOSIS — N1831 Chronic kidney disease, stage 3a: Secondary | ICD-10-CM

## 2021-07-02 DIAGNOSIS — E1159 Type 2 diabetes mellitus with other circulatory complications: Secondary | ICD-10-CM

## 2021-07-02 NOTE — Chronic Care Management (AMB) (Signed)
Chronic Care Management   CCM RN Visit Note  07/02/2021 Name: Micheal Walls MRN: [id number] DOB: 12/13/1935  Subjective: Micheal Walls is a 86 y.o. year old male who is a primary care patient of [name], [place]. The care management team was consulted for assistance with disease management and care coordination needs.    Engaged with patient by telephone for follow up visit in response to provider referral for case management and/or care coordination services.   Consent to Services:  The patient was given information about Chronic Care Management services, agreed to services, and gave verbal consent prior to initiation of services.  Please see initial visit note for detailed documentation.   Patient agreed to services and verbal consent obtained.   Assessment: Review of patient past medical history, allergies, medications, health status, including review of consultants reports, laboratory and other test data, was performed as part of comprehensive evaluation and provision of chronic care management services.   SDOH (Social Determinants of Health) assessments and interventions performed:  Yes, no acute changes  CCM Care Plan  Allergies  Allergen Reactions   Vancomycin Rash    Outpatient Encounter Medications as of 07/02/2021  Medication Sig   allopurinol (ZYLOPRIM) 100 MG tablet Take 100 mg by mouth 2 (two) times daily.   aspirin EC 81 MG tablet Take 81 mg by mouth daily. Swallow whole.   colchicine 0.6 MG tablet Take 0.6 mg by mouth once a week.   Continuous Blood Gluc Sensor (FREESTYLE LIBRE 14 DAY SENSOR) MISC Use as directed to check blood sugars   diltiazem (CARDIZEM CD) 240 MG 24 hr capsule Take 1 capsule (240 mg total) by mouth daily.   dorzolamide (TRUSOPT) 2 % ophthalmic solution Place 1 drop into both eyes 2 (two) times daily.   ferrous sulfate 325 (65 FE) MG tablet Take 325 mg by mouth 2 (two) times daily with a meal.   furosemide (LASIX) 40 MG tablet Take 2  tablets in the morning and one in the evening   gabapentin (NEURONTIN) 100 MG capsule TAKE TWO CAPSULES BY MOUTH EVERY MORNING and TAKE ONE CAPSULE BY MOUTH AT NOON and TAKE ONE CAPSULE BY MOUTH EVERY EVENING   gentamicin cream (GARAMYCIN) 0.1 % Apply 1 application topically daily. To foot wound with dressing changes   glucose blood (FREESTYLE PRECISION NEO TEST) test strip Use as instructed   ketotifen (ZADITOR) 0.025 % ophthalmic solution Place 1 drop into both eyes 2 (two) times daily.   latanoprost (XALATAN) 0.005 % ophthalmic solution Place 1 drop into both eyes at bedtime.    Multiple Vitamins-Minerals (MULTIVITAMIN WITH MINERALS) tablet Take 1 tablet by mouth daily. Centrum men 50+   Multiple Vitamins-Minerals (PRESERVISION AREDS) CAPS Take 1 capsule by mouth 2 (two) times daily.   pantoprazole (PROTONIX) 40 MG tablet Take 1 tablet (40 mg total) by mouth daily.   Polyvinyl Alcohol-Povidone PF (REFRESH) 1.4-0.6 % SOLN Place 1 drop into both eyes 2 (two) times daily.   rosuvastatin (CRESTOR) 20 MG tablet Take 1 tablet (20 mg total) by mouth daily.   Semaglutide, [name], (OZEMPIC, [name],) 2 MG/1.5ML SOPN Inject 1 mg into the skin once a week.   senna-docusate (SENOKOT-S) 8.6-50 MG tablet Take 1 tablet by mouth 2 (two) times daily between meals as needed for mild constipation or moderate constipation. (Patient taking differently: Take 1 tablet by mouth 2 (two) times daily.)   SYNTHROID 25 MCG tablet Take 1 tablet by mouth Monday - Friday  tamsulosin (FLOMAX) 0.4 MG CAPS capsule Take 0.4 mg by mouth daily.   No facility-administered encounter medications on file as of 07/02/2021.    Patient Active Problem List   Diagnosis Date Noted   Pseudophakia of both eyes 04/12/2021   Infection of amputation stump of left lower extremity ([place]) 10/21/2020   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[place])    Left ankle swelling    Diabetic foot infection ([place])    Cerebral thrombosis with cerebral infarction  07/20/2020   Thrombocytopenia ([place]) 07/19/2020   Cerebral ischemia 07/18/2020   Bilateral hearing loss 05/20/2020   Does use hearing aid 05/20/2020   Ulcer of left foot with fat layer exposed ([place]) 01/16/2020   Controlled type 2 diabetes mellitus with stable proliferative retinopathy of both eyes, with long-term current use of insulin ([place]) 01/06/2020   Intermediate stage nonexudative age-related macular degeneration of both eyes 01/06/2020   Left epiretinal membrane 01/06/2020   [place] of right macula 01/06/2020   AKI (acute kidney injury) ([place]) 11/02/2019   Cellulitis 11/02/2019   Elevated TSH 07/10/2018   Nephropathy 02/28/2018   Disturbance of skin sensation 02/28/2018   PAD (peripheral artery disease) ([place]) 07/17/2017   110.1 10/21/2013   Epiphora 04/24/2013   Laxity of eyelid 04/24/2013   Anemia 04/09/2013   History of peripheral vascular disease 04/09/2013   History of stroke 04/09/2013   S/P unilateral BKA (below knee amputation) ([place]) 03/17/2013   Diabetes mellitus type 2 in obese ([place]) 03/17/2013   Mixed hyperlipidemia 03/17/2013   Essential hypertension 03/17/2013   Gout 03/17/2013   CKD (chronic kidney disease), stage III ([place]) 03/17/2013   OSA on CPAP 03/17/2013   Anemia, iron deficiency 03/17/2013   Diverticulosis of colon 03/17/2013   RBBB 03/17/2013   Punctal stenosis, acquired 10/02/2012    Conditions to be addressed/monitored: DM II, PAD, CKD III  Care Plan : RN Care Manager Plan of Care  Updates made by Lynne Logan, RN since 07/02/2021 12:00 AM     Problem: Chronic disease education and Care Coordination needs for PAD, Cellulitis of left lower extremity, Neuropathy, Mixed hyperlipidemia, Type 2 diabetes mellitus, Stage 4 CKD   Priority: High     Long-Range Goal: Establish Plan for Chronic disease education and Care Coordination needs for PAD, Cellulitis of left lower extremity, Neuropathy, Mixed hyperlipidemia, Type 2 diabetes mellitus, Stage 4 CKD    Start Date: 03/16/2021  Expected End Date: 03/16/2022  Recent Progress: On track  Priority: High  Note:   Current Barriers:  Knowledge Deficits related to plan of care for management of PAD, Cellulitis of left lower extremity, Neuropathy, Mixed hyperlipidemia, Type 2 diabetes mellitus, Stage 4 CKD  Chronic Disease Management support and education needs related to PAD, Cellulitis of left lower extremity, Neuropathy, Mixed hyperlipidemia, Type 2 diabetes mellitus, Stage 4 CKD   RNCM Clinical Goal(s):  Patient will verbalize understanding of plan for management of PAD, Cellulitis of left lower extremity, Neuropathy, Mixed hyperlipidemia, Type 2 diabetes mellitus  demonstrate Improved health management independence   continue to work with RN Care Manager to address care management and care coordination needs related to  PAD, Cellulitis of left lower extremity, Neuropathy, Mixed hyperlipidemia, Type 2 diabetes mellitus through collaboration with RN Care manager, provider, and care team.   Interventions: 1:1 collaboration with primary care provider regarding development and update of comprehensive plan of care as evidenced by provider attestation and co-signature Inter-disciplinary care team collaboration (see longitudinal plan of care) Evaluation of current treatment plan related  to  self management and patient's adherence to plan as established by provider  [place] Interventions: Goal on track:  Yes Evaluation of current treatment plan related to  Cellulitis of left lower extremity , self-management and patient's adherence to plan as established by provider. Determined patient continues to have a small ulceration to his left later foot being managed by wound clinic and Vascular  Discussed recommendations for wound care every other day per wound MD, patient is adhering and is able to self perform dressing changes with the help of his wife as needed Educated patient on sign/symptoms of infection  and when to notify wound MD of changes if they occur Educated patient on importance of adequate daily glycemic control to aid in healing  Educated patient on importance of adequate protein to help aid in wound healing  Discussed importance to keep all scheduled MD appointments as directed in order to identify wound status change promptly  Discussed plans with patient for ongoing care management follow up and provided patient with direct contact information for care management team  Diabetes Interventions:  (Status:  Goal on track:  Yes.) Long Term Goal Assessed patient's understanding of A1c goal:  <5.7 Provided education to patient about basic DM disease process Reviewed medications with patient and discussed importance of medication adherence Advised patient, providing education and rationale, to check cbg daily before meals and record, calling PCP and or RN CM  for findings outside established parameters Review of patient status, including review of consultants reports, relevant laboratory and other test results, and medications completed Educated patient on daily glycemic control, FBS 80-130, <180 after meals Educated on importance of good BS control to aid in wound healing  Discussed plans with patient for ongoing care management follow up and provided patient with direct contact information for care management team Lab Results  Component Value Date   HGBA1C 6.5 (H) 05/11/2021      Chronic Kidney Disease Interventions:  (Status:  Goal on track:  Yes Evaluation of current treatment plan related to chronic kidney disease self management and patient's adherence to plan as established by provider      Reviewed prescribed diet increase water to 64 oz daily  Discussed plans with patient for ongoing care management follow up and provided patient with direct contact information for care management team    Provided education on kidney disease progression    Last practice recorded BP readings:   BP Readings from Last 3 Encounters:  07/01/21 136/69  05/20/21 124/60  05/11/21 120/66  Most recent eGFR/CrCl:  Lab Results  Component Value Date   EGFR 27 (L) 05/11/2021    No components found for: CRCL   Patient Goals/Self-Care Activities: Patient will self administer medications as prescribed Patient will attend all scheduled provider appointments Patient will call pharmacy for medication refills Patient will call provider office for new concerns or questions  Follow Up Plan:  Telephone follow up appointment with care management team member scheduled for:  08/13/21      Plan:Telephone follow up appointment with care management team member scheduled for:  08/13/21  Barb Merino, RN, BSN, CCM Care Management Coordinator [place] [facility]  Direct Phone: [phone]

## 2021-07-02 NOTE — Patient Instructions (Signed)
Visit Information  Thank you for taking time to visit with me today. Please don't hesitate to contact me if I can be of assistance to you before our next scheduled telephone appointment.  Following are the goals we discussed today:  (Copy and paste patient goals from clinical care plan here)  Our next appointment is by telephone on 08/13/21 at 12:35 PM  Please call the care guide team at [phone] if you need to cancel or reschedule your appointment.   If you are experiencing a Mental Health or [place] or need someone to talk to, please call 1-800-273-TALK (toll free, 24 hour hotline)   Patient verbalizes understanding of instructions and care plan provided today and agrees to view in [place]. Active MyChart status confirmed with patient.    Barb Merino, RN, BSN, CCM Care Management Coordinator [place] [facility]  Direct Phone: [phone]

## 2021-07-05 ENCOUNTER — Telehealth: Payer: Self-pay

## 2021-07-05 ENCOUNTER — Other Ambulatory Visit: Payer: Self-pay | Admitting: *Deleted

## 2021-07-05 DIAGNOSIS — I739 Peripheral vascular disease, unspecified: Secondary | ICD-10-CM

## 2021-07-05 NOTE — Telephone Encounter (Signed)
Patient called the office stating his left foot is very sore , sore to touch and he would like advice on what he should do next.  Please advise.Micheal Walls

## 2021-07-05 NOTE — Telephone Encounter (Signed)
OK. If you can let him know if this doesn't improve or gets worse before then he should let us know. I am out of town after Wednesday until 2/6 so if he has issues he should be scheduled for an eval for another provider

## 2021-07-05 NOTE — Telephone Encounter (Signed)
I called the patient back he stated due to transportation he would not be able to come to [place] so I schedule him an appointment with you on 07/22/21.

## 2021-07-05 NOTE — Telephone Encounter (Signed)
Can he see me in [place] on Wednesday? I'm loaded in [place] tomorrow

## 2021-07-09 ENCOUNTER — Telehealth: Payer: Self-pay

## 2021-07-09 ENCOUNTER — Ambulatory Visit: Payer: Medicare Other

## 2021-07-09 DIAGNOSIS — E039 Hypothyroidism, unspecified: Secondary | ICD-10-CM

## 2021-07-09 DIAGNOSIS — E1159 Type 2 diabetes mellitus with other circulatory complications: Secondary | ICD-10-CM

## 2021-07-09 NOTE — Progress Notes (Signed)
Chronic Care Management Pharmacy Note  07/20/2021 Name:  Micheal Walls MRN:  [id number] DOB:  [birth year]  Summary: Patient reports taking his medication every day at the same time on a routine.   Recommendations/Changes made from today's visit: Recommend patient keep in touch with PCP team with any questions or concerns. Recommend patient use insurance to determine available exercise programs.   Plan: Patient reports that he is going to enroll in an exercise program using his insurance. Encouraged Mr. Joye to discuss his interest in exercise with his medical team including podiatry, neurology and cardiologist.    Subjective: Micheal Walls is an 86 y.o. year old male who is a primary patient of [name], [place].  The CCM team was consulted for assistance with disease management and care coordination needs.    Engaged with patient by telephone for follow up visit in response to provider referral for pharmacy case management and/or care coordination services.   Consent to Services:  The patient was given information about Chronic Care Management services, agreed to services, and gave verbal consent prior to initiation of services.  Please see initial visit note for detailed documentation.   Patient Care Team: [name], [name] as PCP - General (General Practice) [name], [name], MD as PCP - Cardiology (Cardiology) Rex Kras, Claudette Stapler, RN as [place] as [place], Sharyn Blitz, [facility] (Pharmacist)  Recent office visits: 05/11/2021 PCP OV 04/29/2021 PCP OV  Recent consult visits: 07/01/2021 Vascular surgery OV 06/28/2021 Podiatry OV 06/08/2021 Podiatry OV 12/5/20221 Podiatry OV 04/23/2021 Cardiology OV 04/15/2021 Podiatry OV 04/12/2021 Retina OV 03/23/2021 Podiatry OV 03/10/2021 Neurology OV 03/04/2021 [facility] visits: 02/26/2021 Hospital Admission    Objective:  Lab  Results  Component Value Date   CREATININE 2.36 (H) 05/11/2021   BUN 76 (HH) 05/11/2021   EGFR 27 (L) 05/11/2021   GFRNONAA 35 (L) 10/24/2020   GFRAA 42 (L) 05/20/2020   NA 143 05/11/2021   K 4.3 05/11/2021   CALCIUM 9.9 05/11/2021   CO2 25 05/11/2021   GLUCOSE 136 (H) 05/11/2021    Lab Results  Component Value Date/Time   HGBA1C 6.5 (H) 05/11/2021 11:52 AM   HGBA1C 6.5 (H) 02/04/2021 09:50 AM   MICROALBUR 30 09/28/2020 03:58 PM   MICROALBUR 80 05/15/2019 10:28 AM    Last diabetic Eye exam:  Lab Results  Component Value Date/Time   HMDIABEYEEXA Retinopathy (A) 06/24/2021 12:00 AM    Last diabetic Foot exam: No results found for: HMDIABFOOTEX   Lab Results  Component Value Date   CHOL 108 05/11/2021   HDL 31 (L) 05/11/2021   LDLCALC 57 05/11/2021   TRIG 105 05/11/2021   CHOLHDL 3.5 05/11/2021    Hepatic Function Latest Ref Rng & Units 05/11/2021 10/29/2020 10/21/2020  Total Protein 6.0 - 8.5 g/dL 6.8 7.2 7.6  Albumin 3.6 - 4.6 g/dL 3.7 3.3(L) 3.3(L)  AST 0 - 40 IU/L 20 22 21   ALT 0 - 44 IU/L 26 29 22   Alk Phosphatase 44 - 121 IU/L 87 82 68  Total Bilirubin 0.0 - 1.2 mg/dL 0.3 0.3 0.4  Bilirubin, Direct 0.00 - 0.40 mg/dL - - -    Lab Results  Component Value Date/Time   TSH 2.550 06/22/2021 11:57 AM   TSH 2.730 05/11/2021 11:52 AM   FREET4 0.95 03/30/2021 10:13 AM    CBC Latest Ref Rng & Units 02/26/2021 10/29/2020 10/24/2020  WBC 3.4 - 10.8 x10E3/uL -  6.4 8.8  Hemoglobin 13.0 - 17.0 g/dL 10.5(L) 9.6(L) 8.1(L)  Hematocrit 39.0 - 52.0 % 31.0(L) 29.6(L) 26.1(L)  Platelets 150 - 450 x10E3/uL - 240 206    No results found for: VD25OH  Clinical ASCVD: Yes  The ASCVD Risk score (Arnett DK, et al., 2019) failed to calculate for the following reasons:   The 2019 ASCVD risk score is only valid for ages 47 to 16   The patient has a prior MI or stroke diagnosis    Depression screen [facility] 2/9 05/20/2021 05/20/2020  Decreased Interest 0 0  Down, Depressed, Hopeless 0 0   PHQ - 2 Score 0 0  Some recent data might be hidden     Social History   Tobacco Use  Smoking Status Former   Types: Cigars   Quit date: 06/12/2001   Years since quitting: 20.1  Smokeless Tobacco Never   BP Readings from Last 3 Encounters:  07/13/21 136/64  07/01/21 136/69  05/20/21 124/60   Pulse Readings from Last 3 Encounters:  07/13/21 69  07/01/21 75  05/20/21 77   Wt Readings from Last 3 Encounters:  07/01/21 248 lb (112.5 kg)  04/23/21 248 lb (112.5 kg)  03/18/21 250 lb (113.4 kg)   BMI Readings from Last 3 Encounters:  07/01/21 32.72 kg/m  05/11/21 32.72 kg/m  04/23/21 32.72 kg/m    Assessment/Interventions: Review of patient past medical history, allergies, medications, health status, including review of consultants reports, laboratory and other test data, was performed as part of comprehensive evaluation and provision of chronic care management services.   SDOH:  (Social Determinants of Health) assessments and interventions performed: No  SDOH Screenings   Alcohol Screen: Not on file  Depression (PHQ2-9): Low Risk    PHQ-2 Score: 0  Financial Resource Strain: Low Risk    Difficulty of Paying Living Expenses: Not hard at all  Food Insecurity: No Food Insecurity   Worried About Charity fundraiser in the Last Year: Never true   Ran Out of Food in the Last Year: Never true  Housing: Not on file  Physical Activity: Inactive   Days of Exercise per Week: 0 days   Minutes of Exercise per Session: 0 min  Social Connections: Not on file  Stress: No Stress Concern Present   Feeling of Stress : Not at all  Tobacco Use: Medium Risk   Smoking Tobacco Use: Former   Smokeless Tobacco Use: Never   Passive Exposure: Not on file  Transportation Needs: No Transportation Needs   Lack of Transportation (Medical): No   Lack of Transportation (Non-Medical): No    CCM Care Plan  Allergies  Allergen Reactions   Vancomycin Rash    Medications Reviewed Today      Reviewed by Frann Rider, NP (Nurse Practitioner) on 07/13/21 at [id number]  Med List Status: <None>   Medication Order Taking? Sig Documenting Provider Last Dose Status Informant  allopurinol (ZYLOPRIM) 100 MG tablet [id number] Yes Take 100 mg by mouth 2 (two) times daily. [provider] Taking Active Self           Med Note Fransico Him Oct 22, 2020  9:51 AM)    aspirin EC 81 MG tablet [id number] Yes Take 81 mg by mouth daily. Swallow whole. [provider] Taking Active Self  colchicine 0.6 MG tablet [id number] Yes Take 0.6 mg by mouth once a week. [provider] Taking Active Self  Continuous Blood Gluc Sensor (FREESTYLE LIBRE 14  DAY SENSOR) MISC [id number] Yes Use as directed to check blood sugars [name], [name] Taking Active Self  diltiazem (CARDIZEM CD) 240 MG 24 hr capsule [id number] Yes Take 1 capsule (240 mg total) by mouth daily. [name], [name], MD Taking Active Self  dorzolamide (TRUSOPT) 2 % ophthalmic solution [id number] Yes Place 1 drop into both eyes 2 (two) times daily. [provider] Taking Active Self  ferrous sulfate 325 (65 FE) MG tablet [id number] Yes Take 325 mg by mouth 2 (two) times daily with a meal. [provider] Taking Active Self           Med Note Gentry Roch   Tue Feb 23, 2021 10:34 AM)    furosemide (LASIX) 40 MG tablet [id number] Yes Take 2 tablets in the morning and one in the evening [name], [name] Taking Active Self  gabapentin (NEURONTIN) 100 MG capsule [id number] Yes TAKE TWO CAPSULES BY MOUTH EVERY MORNING and TAKE ONE CAPSULE BY MOUTH AT NOON and TAKE ONE CAPSULE BY MOUTH EVERY Wenda Low, [name] Taking Active   gentamicin cream (GARAMYCIN) 0.1 % [id number] Yes Apply 1 application topically daily. To foot wound with dressing changes [name], DPM Taking Active Self  glucose blood (FREESTYLE PRECISION NEO TEST) test strip [id number] Yes Use as instructed [name], [name]  Taking Active Self  ketotifen (ZADITOR) 0.025 % ophthalmic solution [id number] Yes Place 1 drop into both eyes 2 (two) times daily. [provider] Taking Active Self  latanoprost (XALATAN) 0.005 % ophthalmic solution [id number] Yes Place 1 drop into both eyes at bedtime.  [provider] Taking Active Self           Med Note Fransico Him Oct 22, 2020 10:00 AM)    Multiple Vitamins-Minerals (MULTIVITAMIN WITH MINERALS) tablet [id number] Yes Take 1 tablet by mouth daily. Centrum men 50+ [provider] Taking Active Self  Multiple Vitamins-Minerals (PRESERVISION AREDS) CAPS [id number] Yes Take 1 capsule by mouth 2 (two) times daily. [provider] Taking Active Self  pantoprazole (PROTONIX) 40 MG tablet [id number] Yes Take 1 tablet (40 mg total) by mouth daily. [name], [name] Taking Active Self  Polyvinyl Alcohol-Povidone PF (REFRESH) 1.4-0.6 % SOLN [id number] Yes Place 1 drop into both eyes 2 (two) times daily. [provider] Taking Active Self  rosuvastatin (CRESTOR) 20 MG tablet [id number] Yes Take 1 tablet (20 mg total) by mouth daily. [name], [name], MD Taking Active   Semaglutide, [name], (OZEMPIC, [name],) 2 MG/1.5ML SOPN [id number] Yes Inject 1 mg into the skin once a week. [name], [name] Taking Active Self  senna-docusate (SENOKOT-S) 8.6-50 MG tablet [id number] Yes Take 1 tablet by mouth 2 (two) times daily between meals as needed for mild constipation or moderate constipation.  Patient taking differently: Take 1 tablet by mouth 2 (two) times daily.   Mercy Riding, MD Taking Active   SYNTHROID 25 MCG tablet [id number] Yes Take 1 tablet by mouth Monday - Friday [name], [name] Taking Active   tamsulosin [facility]) 0.4 MG CAPS capsule [id number] Yes Take 0.4 mg by mouth daily. [provider] Taking Active Self            Patient Active Problem List   Diagnosis Date Noted   Pseudophakia of both eyes 04/12/2021    Infection of amputation stump of left lower extremity ([place]) 10/21/2020   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[place])    Left ankle swelling    Diabetic foot infection ([place])  Cerebral thrombosis with cerebral infarction 07/20/2020   Thrombocytopenia ([place]) 07/19/2020   Cerebral ischemia 07/18/2020   Bilateral hearing loss 05/20/2020   Does use hearing aid 05/20/2020   Ulcer of left foot with fat layer exposed ([place]) 01/16/2020   Controlled type 2 diabetes mellitus with stable proliferative retinopathy of both eyes, with long-term current use of insulin ([place]) 01/06/2020   Intermediate stage nonexudative age-related macular degeneration of both eyes 01/06/2020   Left epiretinal membrane 01/06/2020   [place] of right macula 01/06/2020   AKI (acute kidney injury) ([place]) 11/02/2019   Cellulitis 11/02/2019   Elevated TSH 07/10/2018   Nephropathy 02/28/2018   Disturbance of skin sensation 02/28/2018   PAD (peripheral artery disease) ([place]) 07/17/2017   110.1 10/21/2013   Epiphora 04/24/2013   Laxity of eyelid 04/24/2013   Anemia 04/09/2013   History of peripheral vascular disease 04/09/2013   History of stroke 04/09/2013   S/P unilateral BKA (below knee amputation) ([place]) 03/17/2013   Diabetes mellitus type 2 in obese ([place]) 03/17/2013   Mixed hyperlipidemia 03/17/2013   Essential hypertension 03/17/2013   Gout 03/17/2013   CKD (chronic kidney disease), stage III ([place]) 03/17/2013   OSA on CPAP 03/17/2013   Anemia, iron deficiency 03/17/2013   Diverticulosis of colon 03/17/2013   RBBB 03/17/2013   Punctal stenosis, acquired 10/02/2012    Immunization History  Administered Date(s) Administered   Fluad Quad(high Dose 65+) 05/20/2020, 04/09/2021   Influenza, High Dose Seasonal PF 03/21/2019   PFIZER(Purple Top)SARS-COV-2 Vaccination 08/31/2019, 09/25/2019, 04/03/2020, 06/22/2021   Pfizer Covid-19 Vaccine Bivalent Booster 37yr & up 06/22/2021   Pneumococcal Conjugate-13 02/23/2016   Pneumococcal  Polysaccharide-23 05/15/2019   Tdap 02/04/2013   Zoster Recombinat (Shingrix) 06/22/2021    Conditions to be addressed/monitored:  Diabetes and Hypothyroidism  Care Plan : [place] Updates made by PMayford Knife RChavessince 07/20/2021 12:00 AM     Problem: DMII, Hypothyroidism   Priority: High  Note:   Current Barriers:  Unable to independently monitor therapeutic efficacy  Pharmacist Clinical Goal(s):  Patient will achieve adherence to monitoring guidelines and medication adherence to achieve therapeutic efficacy through collaboration with PharmD and provider.   Interventions: 1:1 collaboration with MMinette Brine [name] regarding development and update of comprehensive plan of care as evidenced by provider attestation and co-signature Inter-disciplinary care team collaboration (see longitudinal plan of care) Comprehensive medication review performed; medication list updated in electronic medical record  Diabetes (A1c goal <7%) -Controlled -Current medications: Ozempic 1 mg once per week on Wednesday   -Current home glucose readings -Denies hypoglycemic/hyperglycemic symptoms -Current meal patterns: patient reports that he is doing pretty good with eating habits. He reports that his daughter  is on her case making sure that he stays healthy.  drinks: plenty of water  -Current exercise: patient is still having problems with getting his prosthetic fixed, he goes back on 07/20/2021 -He has come down from 310 lbs, to 246 lbs  -Educated on A1c and blood sugar goals; Complications of diabetes including kidney damage, retinal damage, and cardiovascular disease; Benefits of weight loss; -Patient would like to do some type of physical activity and play with great grand children.  -Counseled to check feet daily and get yearly eye exams -Recommended to continue current medication  Hypothyroidism  (Goal:  higher TSH concentrations in older patients, with an upper limit of  normal of approximately 7.550mU/L in 864year olds ) -Not ideally controlled -Current treatment  Synthroid 25 mg tablet- Monday  through Friday  -We discussed the signs and symptoms of hyperthyroidism, hypothroidism  -weight loss,   -We also discussed proper administration of thyroid medication to include: -Recommended to continue current medication  Patient Goals/Self-Care Activities Patient will:  - take medications as prescribed as evidenced by patient report and record review  Follow Up Plan: The patient has been provided with contact information for the care management team and has been advised to call with any health related questions or concerns.     Long-Range Goal: Disease Management   Recent Progress: On track  Priority: High  Note:   Current Barriers:  Unable to independently monitor therapeutic efficacy  Pharmacist Clinical Goal(s):  Patient will achieve adherence to monitoring guidelines and medication adherence to achieve therapeutic efficacy through collaboration with PharmD and provider.   Interventions: 1:1 collaboration with [name], [name] regarding development and update of comprehensive plan of care as evidenced by provider attestation and co-signature Inter-disciplinary care team collaboration (see longitudinal plan of care) Comprehensive medication review performed; medication list updated in electronic medical record  Diabetes (A1c goal <7%) -Controlled -Current medications: Ozempic 1 mg once per week on Wednesday   -Current home glucose readings -Denies hypoglycemic/hyperglycemic symptoms -Current meal patterns: patient reports that he is doing pretty good with eating habits. He reports that his daughter  is on her case making sure that he stays healthy.  drinks: plenty of water  -Current exercise: patient is still having problems with getting his prosthetic fixed, he goes back on 07/20/2021 -He has come down from 310 lbs, to 246 lbs  -Educated on A1c  and blood sugar goals; Complications of diabetes including kidney damage, retinal damage, and cardiovascular disease; Benefits of weight loss; -Patient would like to do some type of physical activity and play with great grand children.  --Encouraged Micheal Walls to discuss his interest in exercise with his medical team including podiatry, neurology and cardiologist.  -Recommended patient discuss these wants with the team so a plan can be made prior to starting exercise.  -Counseled to check feet daily and get yearly eye exams -Recommended to continue current medication   Hypothyroidism  (Goal:  higher TSH concentrations in older patients, with an upper limit of normal of approximately 7.22 mU/L in 40 year olds ) -Not ideally controlled -Current treatment  Synthroid 25 mg tablet- Monday through Friday  -We discussed the signs and symptoms of hyperthyroidism, hypothroidism  -weight loss,   -We also discussed proper administration of thyroid medication to include: -Recommended to continue current medication  Patient Goals/Self-Care Activities Patient will:  - take medications as prescribed as evidenced by patient report and record review  Follow Up Plan: The patient has been provided with contact information for the care management team and has been advised to call with any health related questions or concerns.       Medication Assistance: None required.  Patient affirms current coverage meets needs.  Compliance/Adherence/Medication fill history: Care Gaps: None noted   Star-Rating Drugs: Ozempic 1 mg  Patient's [facility] pharmacy [facility]  [id number] [address]Phone: [phone]: [phone] [facility] [place] [address]Phone: [phone]: [phone] [facility] [place] [address] [place]Phone: [phone]: [phone] Uses pill box? Yes Pt  endorses 95% compliance  We discussed: Benefits of medication synchronization, packaging and delivery as well as enhanced pharmacist oversight with [facility]. Patient decided to: Continue current medication management strategy  Care Plan and Follow Up Patient Decision:  Patient agrees to Care Plan and Follow-up.  Plan: The patient has been provided with contact information for the care management team and has been advised to call with any health related questions or concerns.   [name], CPP, PharmD Clinical Pharmacist Practitioner [facility] [phone]

## 2021-07-09 NOTE — Chronic Care Management (AMB) (Signed)
Chronic Care Management Pharmacy Assistant   Name: Micheal Walls  MRN: [id number] DOB: Sep 06, 1935  Reason for Encounter: Medication Review/ Medication Coordination  Recent office visits:  None  Recent consult visits:  None  Hospital visits:  None in previous 6 months  Medications: Outpatient Encounter Medications as of 07/09/2021  Medication Sig   allopurinol (ZYLOPRIM) 100 MG tablet Take 100 mg by mouth 2 (two) times daily.   aspirin EC 81 MG tablet Take 81 mg by mouth daily. Swallow whole.   colchicine 0.6 MG tablet Take 0.6 mg by mouth once a week.   Continuous Blood Gluc Sensor (FREESTYLE LIBRE 14 DAY SENSOR) MISC Use as directed to check blood sugars   diltiazem (CARDIZEM CD) 240 MG 24 hr capsule Take 1 capsule (240 mg total) by mouth daily.   dorzolamide (TRUSOPT) 2 % ophthalmic solution Place 1 drop into both eyes 2 (two) times daily.   ferrous sulfate 325 (65 FE) MG tablet Take 325 mg by mouth 2 (two) times daily with a meal.   furosemide (LASIX) 40 MG tablet Take 2 tablets in the morning and one in the evening   gabapentin (NEURONTIN) 100 MG capsule TAKE TWO CAPSULES BY MOUTH EVERY MORNING and TAKE ONE CAPSULE BY MOUTH AT NOON and TAKE ONE CAPSULE BY MOUTH EVERY EVENING   gentamicin cream (GARAMYCIN) 0.1 % Apply 1 application topically daily. To foot wound with dressing changes   glucose blood (FREESTYLE PRECISION NEO TEST) test strip Use as instructed   ketotifen (ZADITOR) 0.025 % ophthalmic solution Place 1 drop into both eyes 2 (two) times daily.   latanoprost (XALATAN) 0.005 % ophthalmic solution Place 1 drop into both eyes at bedtime.    Multiple Vitamins-Minerals (MULTIVITAMIN WITH MINERALS) tablet Take 1 tablet by mouth daily. Centrum men 50+   Multiple Vitamins-Minerals (PRESERVISION AREDS) CAPS Take 1 capsule by mouth 2 (two) times daily.   pantoprazole (PROTONIX) 40 MG tablet Take 1 tablet (40 mg total) by mouth daily.   Polyvinyl Alcohol-Povidone PF  (REFRESH) 1.4-0.6 % SOLN Place 1 drop into both eyes 2 (two) times daily.   rosuvastatin (CRESTOR) 20 MG tablet Take 1 tablet (20 mg total) by mouth daily.   Semaglutide, [name], (OZEMPIC, [name],) 2 MG/1.5ML SOPN Inject 1 mg into the skin once a week.   senna-docusate (SENOKOT-S) 8.6-50 MG tablet Take 1 tablet by mouth 2 (two) times daily between meals as needed for mild constipation or moderate constipation. (Patient taking differently: Take 1 tablet by mouth 2 (two) times daily.)   SYNTHROID 25 MCG tablet Take 1 tablet by mouth Monday - Friday   tamsulosin (FLOMAX) 0.4 MG CAPS capsule Take 0.4 mg by mouth daily.   No facility-administered encounter medications on file as of 07/09/2021.   Reviewed chart for medication changes ahead of medication coordination call.  No OVs, Consults, or hospital visits since last care coordination call/Pharmacist visit  No medication changes indicated OR if recent visit, treatment plan here.  BP Readings from Last 3 Encounters:  07/01/21 136/69  05/20/21 124/60  05/11/21 120/66    Lab Results  Component Value Date   HGBA1C 6.5 (H) 05/11/2021     Patient obtains medications through Adherence Packaging  90 Days   Last adherence delivery included:  Gabapentin 100 mg- 2 tablets tid (breakfast, lunch, evening meal) Allopurinol 100 mg- 2 tablets daily (breakfast) Flomax 0.4 mg- 1 tablet daily (breakfast)  Colchicine 0.6 mg- 1 tablet once weekly (breakfast) Diltiazem CD 240 mg- 1 tablet daily (breakfast) Rosuvastatin 10 mg- 1 tablet once on Monday (bedtime) Furosemide 40 mg- 2 tablets in the morning and 1 tablet in the evening (breakfast, evening meal) Pantoprazole 40 mg- 1 tablet daily (bedtime)  Patient declined (meds) last month: None  Patient is due for next adherence delivery on: 07-22-2021  Called patient and reviewed medications and coordinated delivery.  This delivery to include: Gabapentin 100 mg- 2 tablets tid  (breakfast, lunch, evening meal) Allopurinol 100 mg- 2 tablets daily (breakfast) Flomax 0.4 mg- 1 tablet daily (breakfast)                    Colchicine 0.6 mg- 1 tablet once weekly (breakfast) Diltiazem CD 240 mg- 1 tablet daily (breakfast) Rosuvastatin 10 mg- 1 tablet once on Monday (bedtime) Furosemide 40 mg- 1 tablet with breakfast and 1 tablet with evening meal Pantoprazole 40 mg- 1 tablet daily (bedtime) Synthroid 25 mcg- 1 tablet at breakfast Monday-friday  Patient declined the following medications: None  Patient needs refills for: Sent refill request Gabapentin 100 mg Furosemide 40 mg (Patient states he is taking 1 tablet with breakfast and one with evening meal) Pantoprazole 40 mg Synthroid 25 mcg  Confirmed delivery date of 07-22-2021 advised patient that pharmacy will contact them the morning of delivery.  NOTES: Patient stated he will be out of the synthroid on 07-13-2021. Asked patient if he has been taking medication Monday-Friday and he stated yes. Not sure why supply didn't last until 07-25-2021. Patient also stated he is taking lasix 40 mg 1 tablet with breakfast and 1 tablet with evening meal. Updated Chasity in the pharmacy and she will do an intake for synthroid to be delivered on 07-12-2021.  07-12-2021: Requested refills on Synthroid again  Care Gaps: Last AWV 05-20-2021  Star Rating Drugs: Rosuvastatin 20 mg- Last filled 04-23-2021 [facility] Ozempic 1 mg- Last filled 01-21-2021 90 DS [facility] mail order (Patient states he has one and a half boxes left)    [place] Pharmacist Assistant [phone]

## 2021-07-12 ENCOUNTER — Other Ambulatory Visit: Payer: Self-pay

## 2021-07-12 DIAGNOSIS — G629 Polyneuropathy, unspecified: Secondary | ICD-10-CM

## 2021-07-12 DIAGNOSIS — E039 Hypothyroidism, unspecified: Secondary | ICD-10-CM

## 2021-07-12 DIAGNOSIS — I739 Peripheral vascular disease, unspecified: Secondary | ICD-10-CM

## 2021-07-12 MED ORDER — SYNTHROID 25 MCG PO TABS: ORAL_TABLET | ORAL | 2 refills | Status: DC

## 2021-07-13 ENCOUNTER — Encounter: Payer: Self-pay | Admitting: Adult Health

## 2021-07-13 ENCOUNTER — Ambulatory Visit (INDEPENDENT_AMBULATORY_CARE_PROVIDER_SITE_OTHER): Payer: Medicare Other | Admitting: Adult Health

## 2021-07-13 VITALS — BP 136/64 | HR 69

## 2021-07-13 DIAGNOSIS — N1831 Chronic kidney disease, stage 3a: Secondary | ICD-10-CM | POA: Diagnosis not present

## 2021-07-13 DIAGNOSIS — E1159 Type 2 diabetes mellitus with other circulatory complications: Secondary | ICD-10-CM | POA: Diagnosis not present

## 2021-07-13 DIAGNOSIS — Z794 Long term (current) use of insulin: Secondary | ICD-10-CM

## 2021-07-13 DIAGNOSIS — I639 Cerebral infarction, unspecified: Secondary | ICD-10-CM | POA: Diagnosis not present

## 2021-07-13 DIAGNOSIS — E039 Hypothyroidism, unspecified: Secondary | ICD-10-CM | POA: Diagnosis not present

## 2021-07-13 DIAGNOSIS — Z8673 Personal history of transient ischemic attack (TIA), and cerebral infarction without residual deficits: Secondary | ICD-10-CM

## 2021-07-13 NOTE — Progress Notes (Signed)
[facility] [address] [place]. [place] [address] [phone]       STROKE FOLLOW UP NOTE  Micheal Walls Date of Birth:  November 30, 1935 Medical Record Number:  [id number]   Reason for Referral: stroke follow up    SUBJECTIVE:   CHIEF COMPLAINT:  Chief Complaint  Patient presents with   Follow-up    Rm 3 alone Pt is well and stable, no new stroke concerns     HPI:   Update 07/13/2021 JM: Returns for stroke follow-up after prior visit approx 10 months ago.  He is unaccompanied.  Overall stable.  Denies new or reoccurring stroke/TIA symptoms. Continued use of motorized wheelchair at baseline, currently awaiting to receive new prosthetic for right BKA so currently not ambulating. Typically able to ambulate short distance with RW.  Denies any recent falls.  Compliant on aspirin and Crestor without side effects.  Blood pressure today 136/64.  Glucose levels routinely monitored which have been stable.  No new concerns at this time.     History provided for reference purposes only Initial visit 09/01/2020 JM: Micheal Walls is being seen for hospital follow-up unaccompanied  Stable from stroke standpoint -denies new or residual stroke/TIA symptoms Compliant on aspirin and Crestor -denies side effects Blood pressure today 119/52 - monitors at home which has been stable Glucose levels stable per pt report - this AM BG 120  Since discharge, returned to ED 2/27 for progressive LLE edema with severe sepsis and left great toe osteomyelitis.  S/p abdominal arteriogram with balloon angioplasty LLE anterior tibialis by Dr. Trula Slade 3/1.  Followed by ID for antibiotic regimen.  Advise follow-up with ID and vascular surgery  Has scheduled f/u with [facility] Friday Continues to follow with ID and reports completing antibiotic regimen yesterday Previously nonweightbearing on LLE with restrictions lifted yesterday and has HH PT coming to his home today  At baseline, able to  ambulate short distances with rolling walker but otherwise he is primarily nonambulatory which is chronic and uses motorized wheelchair  No concerns at this time  Stroke admission 07/18/2020 [name] is a 86 y.o. male with history of diabetes mellitus with peripheral vascular disease status post Right BKA, diastolic CHF  who presented on 07/18/2020 with generalized weakness, chills, fever, and acute on chronic kidney disease.     Personally reviewed hospitalization pertinent progress notes, lab work and imaging with summary provided.  Evaluated by Dr. Erlinda Hong with stroke work-up revealing incidental right frontal lobe subacute infarct secondary to small vessel disease.  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  MRA possible right VA occlusion or high-grade stenosis.  Carotid Doppler no significant stenosis.  Recommend DAPT for 3 weeks and aspirin alone.  HTN stable.  LDL 43 on Crestor MWF.  Controlled DM A1c 6.1.  Other stroke risk factors include AKI on CKD stage IV, advanced age, former tobacco use, obesity, prior stroke history, OSA on CPAP, diastolic CHF and PVD s/p R BKA.  Other active problems include gout, thrombocytopenia, R BBB and diabetic left great toe infection treated with ceftriaxone and Flagyl.  Evaluated by therapies who recommended [facility] PT/OT  Stroke: incidental right frontal lobe subacute infarct secondary to small vessel disease source CT head right > left subacute cerebellar infarcts.  MRI subacute punctate infarct of right frontal lobe, vs. T2 shine through per my read (per Dr. Erlinda Hong). Old bilateral cerebellar and pontine infarct. MRA right vertebral artery not seen possible occlusion or high-grade stenosis.  Carotid Doppler no significant stenosis 2D Echo EF 55-60%, no SOE LDL 43 HgbA1c 6.1 VTE prophylaxis - Lovenox $RemoveB'40mg'HMWEGPah$  daily aspirin 81 mg daily prior to admission, now on aspirin 81 mg daily and clopidogrel 75 mg daily. DAPT for 21 days then Aspirin  alone Therapy recommendations: home health OT and PT Disposition: home      ROS:   14 system review of systems performed and negative with exception of those listed in HPI  PMH:  Past Medical History:  Diagnosis Date   Acute metabolic encephalopathy 11/19/4852   Amputated below knee ([employer])    right   Anemia    Aspiration pneumonia ([place]) 12/07/348   Diastolic heart failure (HCC)    Diverticulosis    DM (diabetes mellitus) ([place])    Gout    Hyperlipemia    OSA on CPAP    RBBB    Sepsis ([place]) 11/02/2019   Systemic hypertension     PSH:  Past Surgical History:  Procedure Laterality Date   ABDOMINAL AORTOGRAM W/LOWER EXTREMITY Left 08/11/2020   Procedure: ABDOMINAL AORTOGRAM W/LOWER EXTREMITY;  Surgeon: Serafina Mitchell, MD;  Location: [place];  Service: Cardiovascular;  Laterality: Left;   ABDOMINAL AORTOGRAM W/LOWER EXTREMITY N/A 02/26/2021   Procedure: ABDOMINAL AORTOGRAM W/LOWER EXTREMITY;  Surgeon: Cherre Robins, MD;  Location: [place] [facility];  Service: Cardiovascular;  Laterality: N/A;   AMPUTATION Left 09/14/2020   Procedure: AMPUTATION LEFT GREAT TOE;  Surgeon: [name], DPM;  Location: [facility];  Service: Podiatry;  Laterality: Left;   LEG AMPUTATION BELOW KNEE     right   NM MYOCAR PERF WALL MOTION  09/18/2009   no ischemia   PERIPHERAL VASCULAR BALLOON ANGIOPLASTY Left 08/11/2020   Procedure: PERIPHERAL VASCULAR BALLOON ANGIOPLASTY;  Surgeon: Serafina Mitchell, MD;  Location: [facility];  Service: Cardiovascular;  Laterality: Left;  AT   PERIPHERAL VASCULAR INTERVENTION Left 02/26/2021   Procedure: PERIPHERAL VASCULAR INTERVENTION;  Surgeon: Cherre Robins, MD;  Location: [facility];  Service: Cardiovascular;  Laterality: Left;    Social History:  Social History   Socioeconomic History   Marital status: Married    Spouse name: Not on file   Number of children: Not on file   Years of education: Not on file   Highest education  level: Not on file  Occupational History   Occupation: retired  Tobacco Use   Smoking status: Former    Types: Cigars    Quit date: 06/12/2001    Years since quitting: 20.0   Smokeless tobacco: Never  Vaping Use   Vaping Use: Former  Substance and Sexual Activity   Alcohol use: No    Alcohol/week: 0.0 standard drinks   Drug use: No   Sexual activity: Not Currently  Other Topics Concern   Not on file  Social History Narrative   Not on file   Social Determinants of Health   Financial Resource Strain: Low Risk    Difficulty of Paying Living Expenses: Not hard at all  Food Insecurity: No Food Insecurity   Worried About Charity fundraiser in the Last Year: Never true   [place] in the Last Year: Never true  Transportation Needs: No Transportation Needs   Lack of Transportation (Medical): No   Lack of Transportation (Non-Medical): No  Physical Activity: Inactive   Days of Exercise per Week: 0 days   Minutes of Exercise per Session: 0 min  Stress: No  Stress Concern Present   Feeling of Stress : Not at all  Social Connections: Not on file  Intimate Partner Violence: Not on file    Family History:  Family History  Problem Relation Age of Onset   Diabetes Mother    Heart attack Father    Cancer Sister     Medications:   Current Outpatient Medications on File Prior to Visit  Medication Sig Dispense Refill   allopurinol (ZYLOPRIM) 100 MG tablet Take 100 mg by mouth 2 (two) times daily.     aspirin EC 81 MG tablet Take 81 mg by mouth daily. Swallow whole.     colchicine 0.6 MG tablet Take 0.6 mg by mouth once a week.     Continuous Blood Gluc Sensor (FREESTYLE LIBRE 14 DAY SENSOR) MISC Use as directed to check blood sugars 6 each 1   diltiazem (CARDIZEM CD) 240 MG 24 hr capsule Take 1 capsule (240 mg total) by mouth daily. 90 capsule 3   dorzolamide (TRUSOPT) 2 % ophthalmic solution Place 1 drop into both eyes 2 (two) times daily.     ferrous sulfate 325 (65 FE) MG  tablet Take 325 mg by mouth 2 (two) times daily with a meal.     furosemide (LASIX) 40 MG tablet Take 2 tablets in the morning and one in the evening 180 tablet 2   gabapentin (NEURONTIN) 100 MG capsule TAKE TWO CAPSULES BY MOUTH EVERY MORNING and TAKE ONE CAPSULE BY MOUTH AT NOON and TAKE ONE CAPSULE BY MOUTH EVERY EVENING 270 capsule 0   gentamicin cream (GARAMYCIN) 0.1 % Apply 1 application topically daily. To foot wound with dressing changes 30 g 1   glucose blood (FREESTYLE PRECISION NEO TEST) test strip Use as instructed 100 each 12   ketotifen (ZADITOR) 0.025 % ophthalmic solution Place 1 drop into both eyes 2 (two) times daily.     latanoprost (XALATAN) 0.005 % ophthalmic solution Place 1 drop into both eyes at bedtime.      Multiple Vitamins-Minerals (MULTIVITAMIN WITH MINERALS) tablet Take 1 tablet by mouth daily. Centrum men 50+     Multiple Vitamins-Minerals (PRESERVISION AREDS) CAPS Take 1 capsule by mouth 2 (two) times daily.     pantoprazole (PROTONIX) 40 MG tablet Take 1 tablet (40 mg total) by mouth daily. 90 tablet 1   Polyvinyl Alcohol-Povidone PF (REFRESH) 1.4-0.6 % SOLN Place 1 drop into both eyes 2 (two) times daily.     rosuvastatin (CRESTOR) 20 MG tablet Take 1 tablet (20 mg total) by mouth daily. 90 tablet 3   Semaglutide, [name], (OZEMPIC, [name],) 2 MG/1.5ML SOPN Inject 1 mg into the skin once a week. 9 mL 1   senna-docusate (SENOKOT-S) 8.6-50 MG tablet Take 1 tablet by mouth 2 (two) times daily between meals as needed for mild constipation or moderate constipation. (Patient taking differently: Take 1 tablet by mouth 2 (two) times daily.) 60 tablet 0   SYNTHROID 25 MCG tablet Take 1 tablet by mouth Monday - Friday 60 tablet 2   tamsulosin (FLOMAX) 0.4 MG CAPS capsule Take 0.4 mg by mouth daily.     No current facility-administered medications on file prior to visit.    Allergies:   Allergies  Allergen Reactions   Vancomycin Rash      OBJECTIVE:  Physical  Exam  Vitals:   07/13/21 0916  BP: 136/64  Pulse: 69   There is no height or weight on file to calculate BMI. No results found.  General: well developed, well nourished,  very pleasant elderly African-American male, seated in motorized wheelchair, in no evident distress Head: head normocephalic and atraumatic.   Neck: supple with no carotid or supraclavicular bruits Cardiovascular: regular rate and rhythm, no murmurs Musculoskeletal: R BKA with prosthetic in place Skin:  left foot in walking shoe and wrapped in gauze  Vascular:  Normal pulses all extremities   Neurologic Exam Mental Status: Awake and fully alert.   Fluent speech and language.  Oriented to place and time. Recent and remote memory intact. Attention span, concentration and fund of knowledge appropriate. Mood and affect appropriate.  Cranial Nerves: Pupils equal, briskly reactive to light. Extraocular movements full without nystagmus. Visual fields full to confrontation. Hearing intact. Facial sensation intact. Face, tongue, palate moves normally and symmetrically.  Motor: Normal bulk and tone. Normal strength in all tested extremity muscles (R BKA) Sensory.: intact to touch , pinprick , position and vibratory sensation.  Coordination: Rapid alternating movements normal in all extremities. Finger-to-nose performed accurately bilaterally  Gait and Station: Deferred Reflexes: 1+ and symmetric. Toes downgoing.          ASSESSMENT: Micheal Walls is a 86 y.o. year old male presented to [place] long ED with generalized weakness, chills, fever and acute on chronic kidney disease on 07/18/2020 like in setting of sepsis secondary to diabetic left foot infection.  Transferred to [facility] ED for evidence of incidental right frontal lobe subacute infarct secondary to small vessel disease. Vascular risk factors include HTN, HLD, DM, PVD s/p R BKA, diastolic CHF, R VA stenosis/occlusion, AKI on CKD stage IV, prior stroke history (b/l  cerebellar and pontine) and OSA on CPAP.      PLAN:  R frontal lobe stroke, prior stroke hx:  Chronic gait impairment from prior strokes.  Recent frontal stroke likely incidental finding as no correlated symptoms.   Continue aspirin 81 mg daily  and Crestor 10 mg MWF for secondary stroke prevention.   Discussed secondary stroke prevention measures and importance of close PCP follow up for aggressive stroke risk factor management  HTN: BP goal <130/90.  Stable on current regimen per PCP HLD: LDL goal <70. LDL 57 (04/2021) on Crestor MWF per PCP.  DMII: A1c goal<7.0. A1c 6.5. (04/2021) routinely monitored and managed by PCP    Overall stable from stroke standpoint without further recommendations.  Follow-up as needed.   CC:  PCP: [name], [name]    I spent 26 minutes of face-to-face and non-face-to-face time with patient.  This included previsit chart review, lab review, study review, electronic health record documentation, patient education regarding prior stroke and history of prior strokes with residual deficits, importance of managing stroke risk factors and secondary stroke prevention measures and answered all other questions to patient satisfaction  Frann Rider, [facility]  [facility] [address] [place], [place] [address]  Phone [phone] Fax [phone] Note: This document was prepared with digital dictation and possible smart phrase technology. Any transcriptional errors that result from this process are unintentional.

## 2021-07-13 NOTE — Patient Instructions (Signed)
Continue aspirin 81 mg daily  and Crestor for secondary stroke prevention  Continue to follow up with PCP regarding cholesterol, blood pressure and diabetes management  Maintain strict control of hypertension with blood pressure goal below 130/90, diabetes with hemoglobin A1c goal below 7.0 % and cholesterol with LDL cholesterol (bad cholesterol) goal below 70 mg/dL.   Signs of a Stroke? Follow the BEFAST method:  Balance Watch for a sudden loss of balance, trouble with coordination or vertigo Eyes Is there a sudden loss of vision in one or both eyes? Or double vision?  Face: Ask the person to smile. Does one side of the face droop or is it numb?  Arms: Ask the person to raise both arms. Does one arm drift downward? Is there weakness or numbness of a leg? Speech: Ask the person to repeat a simple phrase. Does the speech sound slurred/strange? Is the person confused ? Time: If you observe any of these signs, call 911.        Thank you for coming to see [place] at [facility]. I hope we have been able to provide you high quality care today.  You may receive a patient satisfaction survey over the next few weeks. We would appreciate your feedback and comments so that we may continue to improve ourselves and the health of our patients.

## 2021-07-14 MED ORDER — FUROSEMIDE 40 MG PO TABS: ORAL_TABLET | ORAL | 1 refills | Status: DC

## 2021-07-14 MED ORDER — GABAPENTIN 100 MG PO CAPS: ORAL_CAPSULE | ORAL | 0 refills | Status: DC

## 2021-07-14 MED ORDER — SYNTHROID 25 MCG PO TABS: ORAL_TABLET | ORAL | 2 refills | Status: DC

## 2021-07-14 MED ORDER — PANTOPRAZOLE SODIUM 40 MG PO TBEC: 40.0000 mg | DELAYED_RELEASE_TABLET | Freq: Every day | ORAL | 1 refills | Status: DC

## 2021-07-20 ENCOUNTER — Other Ambulatory Visit: Payer: Self-pay | Admitting: Nurse Practitioner

## 2021-07-20 ENCOUNTER — Other Ambulatory Visit: Payer: Self-pay

## 2021-07-20 DIAGNOSIS — G629 Polyneuropathy, unspecified: Secondary | ICD-10-CM

## 2021-07-20 DIAGNOSIS — I739 Peripheral vascular disease, unspecified: Secondary | ICD-10-CM

## 2021-07-20 MED ORDER — GABAPENTIN 100 MG PO CAPS: ORAL_CAPSULE | ORAL | 0 refills | Status: DC

## 2021-07-20 NOTE — Patient Instructions (Addendum)
Visit Information It was great speaking with you today!  Please let me know if you have any questions about our visit.   Goals Addressed             This Visit's Progress    Manage My Medicine       Timeframe:  Long-Range Goal Priority:  High Start Date:                             Expected End Date:                       Follow Up Date 11/05/2021   In Progress:  - keep a list of all the medicines I take; vitamins and herbals too - use a pillbox to sort medicine - use an alarm clock or phone to remind me to take my medicine    Why is this important?   These steps will help you keep on track with your medicines.        Patient Care Plan: CCM Pharmacy Care Plan     Problem Identified: DMII, Hypothyroidism   Priority: High  Note:   Current Barriers:  Unable to independently monitor therapeutic efficacy  Pharmacist Clinical Goal(s):  Patient will achieve adherence to monitoring guidelines and medication adherence to achieve therapeutic efficacy through collaboration with PharmD and provider.   Interventions: 1:1 collaboration with [name], [name] regarding development and update of comprehensive plan of care as evidenced by provider attestation and co-signature Inter-disciplinary care team collaboration (see longitudinal plan of care) Comprehensive medication review performed; medication list updated in electronic medical record  Diabetes (A1c goal <7%) -Controlled -Current medications: Ozempic 1 mg once per week on Wednesday  -Current home glucose readings -Denies hypoglycemic/hyperglycemic symptoms -Current meal patterns: patient reports that he is doing pretty good with eating habits. He reports that his daughter  is on her case making sure that he stays healthy.  drinks: plenty of water  -Current exercise: patient is still having problems with getting his prosthetic fixed, he goes back on 07/20/2021 -He has come down from 310 lbs, to 246 lbs  -Educated on  A1c and blood sugar goals; Complications of diabetes including kidney damage, retinal damage, and cardiovascular disease; Benefits of weight loss; -Patient would like to do some type of physical activity and play with great grand children.  -Encouraged Mr. Heitzenrater to discuss his interest in exercise with his medical team including podiatry, neurology and cardiologist.  -Recommended patient discuss these wants with the team so a plan can be made prior to starting exercise.  -Counseled to check feet daily and get yearly eye exams -Recommended to continue current medication  Hypothyroidism  (Goal:  higher TSH concentrations in older patients, with an upper limit of normal of approximately 7.2 mU/L in 47 year olds ) -Not ideally controlled -Current treatment  Synthroid 25 mg tablet- Monday through Friday  -We discussed the signs and symptoms of hyperthyroidism, hypothroidism  -weight loss,   -We also discussed proper administration of thyroid medication to include: -Recommended to continue current medication  Patient Goals/Self-Care Activities Patient will:  - take medications as prescribed as evidenced by patient report and record review  Follow Up Plan: The patient has been provided with contact information for the care management team and has been advised to call with any health related questions or concerns.      Patient agreed to services and verbal consent  obtained.   The patient verbalized understanding of instructions, educational materials, and care plan provided today and agreed to receive a mailed copy of patient instructions, educational materials, and care plan.   [name], PharmD Clinical Pharmacist [facility] [phone]

## 2021-07-22 ENCOUNTER — Other Ambulatory Visit: Payer: Self-pay

## 2021-07-22 ENCOUNTER — Ambulatory Visit (INDEPENDENT_AMBULATORY_CARE_PROVIDER_SITE_OTHER): Payer: Medicare Other | Admitting: Podiatry

## 2021-07-22 DIAGNOSIS — L97522 Non-pressure chronic ulcer of other part of left foot with fat layer exposed: Secondary | ICD-10-CM | POA: Diagnosis not present

## 2021-07-22 NOTE — Progress Notes (Signed)
°  Subjective:  Patient ID: Micheal Walls, male    DOB: 17-Jul-1935,  MRN: [id number]  Chief Complaint  Patient presents with   Foot Ulcer     wound left ( left foot )     DOS: 09/14/2020 Procedure: Left hallux amputation  86 y.o. male returns for post-op check.  Thinks it still doing well he has been using the gentamicin cream.  His nails are thickened elongated and difficult to take care of  Review of Systems: Negative except as noted in the HPI. Denies N/V/F/Ch.   Objective:   There were no vitals filed for this visit.  There is no height or weight on file to calculate BMI. Constitutional Well developed. Well nourished.  Vascular Foot warm and well perfused. Capillary refill normal to all digits.   Neurologic Normal speech. Oriented to person, place, and time. Epicritic sensation to light touch grossly present bilaterally.  Dermatologic Prior first toe amputation incision dehiscence has completely healed now.   lateral foot ulceration full-thickness with exposed subcutaneous tissues measure 1.0 x 1.1 x0.2, slight increase in diameter but appearance improved.  For toenails thickened elongated brown-yellow discoloration in the left foot  Orthopedic:  Minimal tenderness to palpation noted about the surgical site.        ABI 0.7  Assessment:   No diagnosis found.        Plan:  Patient was evaluated and treated and all questions answered.  Discussed the etiology and treatment options for the condition in detail with the patient. Educated patient on the topical and oral treatment options for mycotic nails. Recommended debridement of the nails today. Sharp and mechanical debridement performed of all painful and mycotic nails today. Nails debrided in length and thickness using a nail nipper to level of comfort. Discussed treatment options including appropriate shoe gear. Follow up as needed for painful nails.   Ulcer left foot -Hallux amputation site remains  healed -We discussed the etiology and factors that are a part of the wound healing process.  We also discussed the risk of infection both soft tissue and osteomyelitis from open ulceration.  Discussed the risk of limb loss if this happens or worsens. -Debridement as below. -Dressed with Iodosorb, DSD. -Continue home dressing changes daily with gauze and gentamicin cream after cleaning with wound cleanser spray   Procedure: Excisional Debridement of Wound Rationale: Removal of non-viable soft tissue from the wound to promote healing.  Anesthesia: none Post-Debridement Wound Measurements: 1.0 x 1.1 x 0.2 cm Type of Debridement: Sharp Excisional Tissue Removed: Non-viable soft tissue Depth of Debridement: subcutaneous tissue. Technique: Sharp excisional debridement to bleeding, viable wound base.  Dressing: Dry, sterile, compression dressing. Disposition: Patient tolerated procedure well.             Return in about 3 weeks (around 08/12/2021) for wound care.

## 2021-07-26 ENCOUNTER — Ambulatory Visit: Payer: Medicare Other | Admitting: Podiatry

## 2021-08-02 ENCOUNTER — Other Ambulatory Visit: Payer: Self-pay

## 2021-08-02 ENCOUNTER — Ambulatory Visit (INDEPENDENT_AMBULATORY_CARE_PROVIDER_SITE_OTHER): Payer: Medicare Other

## 2021-08-02 DIAGNOSIS — I739 Peripheral vascular disease, unspecified: Secondary | ICD-10-CM

## 2021-08-02 DIAGNOSIS — E1159 Type 2 diabetes mellitus with other circulatory complications: Secondary | ICD-10-CM

## 2021-08-02 DIAGNOSIS — Z794 Long term (current) use of insulin: Secondary | ICD-10-CM

## 2021-08-02 MED ORDER — FREESTYLE LIBRE 14 DAY SENSOR MISC: 1 refills | Status: DC

## 2021-08-02 NOTE — Chronic Care Management (AMB) (Signed)
Chronic Care Management    Social Work Note  08/02/2021 Name: Micheal Walls MRN: [id number] DOB: 08-Mar-1936  Micheal Walls is a 86 y.o. year old male who is a primary care patient of [name], [place]. The CCM team was consulted to assist the patient with chronic disease management and/or care coordination needs related to:  PAD, DM II .   Engaged with patients daughter [name] by phone  for follow up visit in response to provider referral for social work chronic care management and care coordination services.   Consent to Services:  The patient was given information about Chronic Care Management services, agreed to services, and gave verbal consent prior to initiation of services.  Please see initial visit note for detailed documentation.   Patient agreed to services and consent obtained.   Assessment: Review of patient past medical history, allergies, medications, and health status, including review of relevant consultants reports was performed today as part of a comprehensive evaluation and provision of chronic care management and care coordination services.     SDOH (Social Determinants of Health) assessments and interventions performed:    Advanced Directives Status: Not addressed in this encounter.  CCM Care Plan  Allergies  Allergen Reactions   Vancomycin Rash    Outpatient Encounter Medications as of 08/02/2021  Medication Sig   allopurinol (ZYLOPRIM) 100 MG tablet Take 100 mg by mouth 2 (two) times daily.   aspirin EC 81 MG tablet Take 81 mg by mouth daily. Swallow whole.   colchicine 0.6 MG tablet Take 0.6 mg by mouth once a week.   Continuous Blood Gluc Sensor (FREESTYLE LIBRE 14 DAY SENSOR) MISC Use as directed to check blood sugars   diltiazem (CARDIZEM CD) 240 MG 24 hr capsule Take 1 capsule (240 mg total) by mouth daily.   dorzolamide (TRUSOPT) 2 % ophthalmic solution Place 1 drop into both eyes 2 (two) times daily.   ferrous sulfate 325 (65 FE) MG  tablet Take 325 mg by mouth 2 (two) times daily with a meal.   furosemide (LASIX) 40 MG tablet Take 1 tablet in the morning and one in the evening   gabapentin (NEURONTIN) 100 MG capsule TAKE TWO CAPSULES BY MOUTH EVERY MORNING and TAKE ONE CAPSULE BY MOUTH AT NOON and TAKE ONE CAPSULE BY MOUTH EVERY EVENING   gentamicin cream (GARAMYCIN) 0.1 % Apply 1 application topically daily. To foot wound with dressing changes   glucose blood (FREESTYLE PRECISION NEO TEST) test strip Use as instructed   ketotifen (ZADITOR) 0.025 % ophthalmic solution Place 1 drop into both eyes 2 (two) times daily.   latanoprost (XALATAN) 0.005 % ophthalmic solution Place 1 drop into both eyes at bedtime.    Multiple Vitamins-Minerals (MULTIVITAMIN WITH MINERALS) tablet Take 1 tablet by mouth daily. Centrum men 50+   Multiple Vitamins-Minerals (PRESERVISION AREDS) CAPS Take 1 capsule by mouth 2 (two) times daily.   pantoprazole (PROTONIX) 40 MG tablet Take 1 tablet (40 mg total) by mouth daily.   Polyvinyl Alcohol-Povidone PF (REFRESH) 1.4-0.6 % SOLN Place 1 drop into both eyes 2 (two) times daily.   rosuvastatin (CRESTOR) 20 MG tablet Take 1 tablet (20 mg total) by mouth daily.   Semaglutide, [name], (OZEMPIC, [name],) 2 MG/1.5ML SOPN Inject 1 mg into the skin once a week.   senna-docusate (SENOKOT-S) 8.6-50 MG tablet Take 1 tablet by mouth 2 (two) times daily between meals as needed for mild constipation or moderate constipation. (Patient taking differently: Take 1  tablet by mouth 2 (two) times daily.)   SYNTHROID 25 MCG tablet Take 1 tablet by mouth Monday - Friday   tamsulosin (FLOMAX) 0.4 MG CAPS capsule Take 0.4 mg by mouth daily.   No facility-administered encounter medications on file as of 08/02/2021.    Patient Active Problem List   Diagnosis Date Noted   Pseudophakia of both eyes 04/12/2021   Infection of amputation stump of left lower extremity ([place]) 10/21/2020   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[place])    Left  ankle swelling    Diabetic foot infection ([place])    Cerebral thrombosis with cerebral infarction 07/20/2020   Thrombocytopenia ([place]) 07/19/2020   Cerebral ischemia 07/18/2020   Bilateral hearing loss 05/20/2020   Does use hearing aid 05/20/2020   Ulcer of left foot with fat layer exposed ([place]) 01/16/2020   Controlled type 2 diabetes mellitus with stable proliferative retinopathy of both eyes, with long-term current use of insulin ([place]) 01/06/2020   Intermediate stage nonexudative age-related macular degeneration of both eyes 01/06/2020   Left epiretinal membrane 01/06/2020   [place] of right macula 01/06/2020   AKI (acute kidney injury) ([place]) 11/02/2019   Cellulitis 11/02/2019   Elevated TSH 07/10/2018   Nephropathy 02/28/2018   Disturbance of skin sensation 02/28/2018   PAD (peripheral artery disease) ([place]) 07/17/2017   110.1 10/21/2013   Epiphora 04/24/2013   Laxity of eyelid 04/24/2013   Anemia 04/09/2013   History of peripheral vascular disease 04/09/2013   History of stroke 04/09/2013   S/P unilateral BKA (below knee amputation) ([place]) 03/17/2013   Diabetes mellitus type 2 in obese ([place]) 03/17/2013   Mixed hyperlipidemia 03/17/2013   Essential hypertension 03/17/2013   Gout 03/17/2013   CKD (chronic kidney disease), stage III ([place]) 03/17/2013   OSA on CPAP 03/17/2013   Anemia, iron deficiency 03/17/2013   Diverticulosis of colon 03/17/2013   RBBB 03/17/2013   Punctal stenosis, acquired 10/02/2012    Conditions to be addressed/monitored: DMII and PAD  Care Plan : Social Work [facility] Plan of Care  Updates made by [name] since 08/02/2021 12:00 AM     Problem: Mobility and Independence      Long-Range Goal: Mobility and Independence Optimized   Start Date: 08/24/2020  This Visit's Progress: On track  Recent Progress: On track  Priority: High  Note:   Current Barriers:  Chronic disease management support and education needs related to DM and PAD   Limited access  to caregiver Unable to independently prepare meals  Social Worker Clinical Goal(s):  patient will work with SW to identify and address any acute and/or chronic care coordination needs related to the self health management of DM and PAD   the patient and his daughter will work with SW to identify caregiver resources for in the home  the patient will be placed on the wait list for mobile meals Goal Met  CCM SW Interventions: Completed with the patients daughter [name] Inter-disciplinary care team collaboration (see longitudinal plan of care) Collaboration with [name], [place] regarding development and update of comprehensive plan of care as evidenced by provider attestation and co-signature Telephonic visit completed with the patients daughter [name] to assess for care coordination needs Mrs. [name] reports she has been in town since the beginning of February and has noticed patient seems to be "giving up" based on his lack of interest in his health Performed chart review to note patient was recently seen by Dr. Sherryle Lis with [facility] to assess  ulcer to left foot Planned wound care follow up is scheduled for 3.2.23 with Dr. Sherryle Lis Discussed the patient continues to receive meals on wheels and was notified he remains on the list for in home assistance Reviewed plans for SW to follow up over the next 60 days  Patient Goals/Self-Care Activities  patient will: With the help of his children  - Continue to receive mobile meals - drivers are to call patient prior to arriving so he may prepare to answer the door -Adhere to Dr. Vivia Ewing recommendations regarding foot care -Contact SW as needed prior to next scheduled call  Follow Up Plan:  SW will follow up with the patient over the next 60 days         Follow Up Plan: SW will follow up with patient by phone over the next 60 days.      [name], [name], [name], Certified Dementia  Practitioner [facility] [phone]

## 2021-08-02 NOTE — Patient Instructions (Signed)
Social Worker Visit Information  Goals we discussed today:   Goals Addressed             This Visit's Progress    Mobility and Independence Optimized   On track    Timeframe:  Long-Range Goal Priority:  High Start Date:  3.14.22                                         Next planned outreach: 4.20.23  Patient Goals/Self-Care Activities  patient will: With the help of his children  - Continue to receive mobile meals - drivers are to call patient prior to arriving so he may prepare to answer the door -Adhere to Dr. Vivia Ewing recommendations regarding foot care -Contact SW as needed prior to next scheduled call         Patient verbalizes understanding of instructions and care plan provided today and agrees to view in [place]. Active MyChart status confirmed with patient.    Follow Up Plan: SW will follow up with patient by phone over the next 60 days   [name], [name], [name], Certified Dementia Practitioner [place] [facility] [phone]

## 2021-08-10 DIAGNOSIS — Z794 Long term (current) use of insulin: Secondary | ICD-10-CM

## 2021-08-10 DIAGNOSIS — E1159 Type 2 diabetes mellitus with other circulatory complications: Secondary | ICD-10-CM

## 2021-08-11 ENCOUNTER — Ambulatory Visit (INDEPENDENT_AMBULATORY_CARE_PROVIDER_SITE_OTHER): Payer: Medicare Other | Admitting: Nurse Practitioner

## 2021-08-11 ENCOUNTER — Other Ambulatory Visit: Payer: Self-pay

## 2021-08-11 ENCOUNTER — Encounter: Payer: Self-pay | Admitting: Nurse Practitioner

## 2021-08-11 VITALS — BP 132/70 | HR 75 | Temp 97.7°F

## 2021-08-11 DIAGNOSIS — M25472 Effusion, left ankle: Secondary | ICD-10-CM | POA: Diagnosis not present

## 2021-08-11 DIAGNOSIS — N184 Chronic kidney disease, stage 4 (severe): Secondary | ICD-10-CM

## 2021-08-11 DIAGNOSIS — I130 Hypertensive heart and chronic kidney disease with heart failure and stage 1 through stage 4 chronic kidney disease, or unspecified chronic kidney disease: Secondary | ICD-10-CM

## 2021-08-11 DIAGNOSIS — E113553 Type 2 diabetes mellitus with stable proliferative diabetic retinopathy, bilateral: Secondary | ICD-10-CM

## 2021-08-11 DIAGNOSIS — Z794 Long term (current) use of insulin: Secondary | ICD-10-CM

## 2021-08-11 DIAGNOSIS — N1832 Chronic kidney disease, stage 3b: Secondary | ICD-10-CM

## 2021-08-11 DIAGNOSIS — E782 Mixed hyperlipidemia: Secondary | ICD-10-CM | POA: Diagnosis not present

## 2021-08-11 DIAGNOSIS — E1122 Type 2 diabetes mellitus with diabetic chronic kidney disease: Secondary | ICD-10-CM

## 2021-08-11 DIAGNOSIS — I739 Peripheral vascular disease, unspecified: Secondary | ICD-10-CM

## 2021-08-11 DIAGNOSIS — E039 Hypothyroidism, unspecified: Secondary | ICD-10-CM

## 2021-08-11 DIAGNOSIS — I5032 Chronic diastolic (congestive) heart failure: Secondary | ICD-10-CM | POA: Diagnosis not present

## 2021-08-11 DIAGNOSIS — Z89519 Acquired absence of unspecified leg below knee: Secondary | ICD-10-CM

## 2021-08-11 LAB — POCT URINALYSIS DIPSTICK
Bilirubin, UA: NEGATIVE
Blood, UA: NEGATIVE
Glucose, UA: NEGATIVE
Ketones, UA: NEGATIVE
Leukocytes, UA: NEGATIVE
Nitrite, UA: NEGATIVE
Protein, UA: POSITIVE — AB
Spec Grav, UA: 1.015 (ref 1.010–1.025)
Urobilinogen, UA: 0.2 E.U./dL
pH, UA: 7 (ref 5.0–8.0)

## 2021-08-11 NOTE — Patient Instructions (Signed)

## 2021-08-11 NOTE — Progress Notes (Signed)
I,Tianna Badgett,acting as a Education administrator for [employer], [name].,have documented all relevant documentation on the behalf of [name], [name],as directed by  [name], [name] while in the presence of [name], [place].  This visit occurred during the SARS-CoV-2 public health emergency.  Safety protocols were in place, including screening questions prior to the visit, additional usage of staff PPE, and extensive cleaning of exam room while observing appropriate contact time as indicated for disinfecting solutions.  Subjective:     Patient ID: Micheal DACOSTA , male    DOB: 09/21/1935 , 86 y.o.   MRN: [id number]   Chief Complaint  Patient presents with   Diabetes    HPI  Patient here for a f/u on his diabetes. He continues to see the foot doctor for his left foot wound, seen last on 07/22/2021, next appt is 08/12/2021  Diabetes He presents for his follow-up diabetic visit. He has type 2 diabetes mellitus. His disease course has been improving. Pertinent negatives for hypoglycemia include no confusion, dizziness or nervousness/anxiousness. There are no diabetic associated symptoms. Pertinent negatives for diabetes include no fatigue, no polydipsia, no polyphagia and no polyuria. There are no hypoglycemic complications. Symptoms are improving. There are no diabetic complications. Risk factors for coronary artery disease include sedentary lifestyle and obesity. Current diabetic treatment includes oral agent (monotherapy) (no longer on Tresiba since December). He is compliant with treatment all of the time. His weight is stable. He is following a diabetic diet. When asked about meal planning, he reported none. He has not had a previous visit with a dietitian. He rarely participates in exercise. His home blood glucose trend is decreasing steadily. (Reports blood sugar has been in the 100's, this morning was 100 and the highest 160) An ACE inhibitor/angiotensin II receptor blocker is being taken. He sees a  podiatrist (Dr Adah Perl).Eye exam is current (Dr. Radene Ou - 01/01/2020).    Past Medical History:  Diagnosis Date   Acute metabolic encephalopathy 06/14/4578   Amputated below knee [facility])    right   Anemia    Aspiration pneumonia ([place]) 9/98/3382   Diastolic heart failure (HCC)    Diverticulosis    DM (diabetes mellitus) ([place])    Gout    Hyperlipemia    OSA on CPAP    RBBB    Sepsis ([place]) 11/02/2019   Systemic hypertension      Family History  Problem Relation Age of Onset   Diabetes Mother    Heart attack Father    Cancer Sister      Current Outpatient Medications:    allopurinol (ZYLOPRIM) 100 MG tablet, Take 100 mg by mouth 2 (two) times daily., Disp: , Rfl:    aspirin EC 81 MG tablet, Take 81 mg by mouth daily. Swallow whole., Disp: , Rfl:    colchicine 0.6 MG tablet, Take 0.6 mg by mouth once a week., Disp: , Rfl:    Continuous Blood Gluc Sensor (FREESTYLE LIBRE 14 DAY SENSOR) MISC, Use as directed to check blood sugars, Disp: 6 each, Rfl: 1   diltiazem (CARDIZEM CD) 240 MG 24 hr capsule, Take 1 capsule (240 mg total) by mouth daily., Disp: 90 capsule, Rfl: 3   dorzolamide (TRUSOPT) 2 % ophthalmic solution, Place 1 drop into both eyes 2 (two) times daily., Disp: , Rfl:    ferrous sulfate 325 (65 FE) MG tablet, Take 325 mg by mouth 2 (two) times daily with a meal., Disp: , Rfl:    furosemide (LASIX) 40 MG tablet,  Take 1 tablet in the morning and one in the evening, Disp: 180 tablet, Rfl: 1   gabapentin (NEURONTIN) 100 MG capsule, TAKE TWO CAPSULES BY MOUTH EVERY MORNING and TAKE ONE CAPSULE BY MOUTH AT NOON and TAKE ONE CAPSULE BY MOUTH EVERY EVENING, Disp: 360 capsule, Rfl: 0   gentamicin cream (GARAMYCIN) 0.1 %, Apply 1 application topically daily. To foot wound with dressing changes, Disp: 30 g, Rfl: 1   glucose blood (FREESTYLE PRECISION NEO TEST) test strip, Use as instructed, Disp: 100 each, Rfl: 12   ketotifen (ZADITOR) 0.025 % ophthalmic solution, Place 1 drop into both  eyes 2 (two) times daily., Disp: , Rfl:    latanoprost (XALATAN) 0.005 % ophthalmic solution, Place 1 drop into both eyes at bedtime. , Disp: , Rfl:    Multiple Vitamins-Minerals (MULTIVITAMIN WITH MINERALS) tablet, Take 1 tablet by mouth daily. Centrum men 50+, Disp: , Rfl:    Multiple Vitamins-Minerals (PRESERVISION AREDS) CAPS, Take 1 capsule by mouth 2 (two) times daily., Disp: , Rfl:    pantoprazole (PROTONIX) 40 MG tablet, Take 1 tablet (40 mg total) by mouth daily., Disp: 90 tablet, Rfl: 1   Polyvinyl Alcohol-Povidone PF (REFRESH) 1.4-0.6 % SOLN, Place 1 drop into both eyes 2 (two) times daily., Disp: , Rfl:    rosuvastatin (CRESTOR) 20 MG tablet, Take 1 tablet (20 mg total) by mouth daily., Disp: 90 tablet, Rfl: 3   Semaglutide, [name], (OZEMPIC, [name],) 2 MG/1.5ML SOPN, Inject 1 mg into the skin once a week., Disp: 9 mL, Rfl: 1   senna-docusate (SENOKOT-S) 8.6-50 MG tablet, Take 1 tablet by mouth 2 (two) times daily between meals as needed for mild constipation or moderate constipation. (Patient taking differently: Take 1 tablet by mouth 2 (two) times daily.), Disp: 60 tablet, Rfl: 0   SYNTHROID 25 MCG tablet, Take 1 tablet by mouth Monday - Friday, Disp: 60 tablet, Rfl: 2   tamsulosin (FLOMAX) 0.4 MG CAPS capsule, Take 0.4 mg by mouth daily., Disp: , Rfl:    Allergies  Allergen Reactions   Vancomycin Rash     Review of Systems  Constitutional: Negative.  Negative for fatigue.  Respiratory: Negative.    Cardiovascular: Negative.   Gastrointestinal: Negative.   Endocrine: Negative for polydipsia, polyphagia and polyuria.  Neurological: Negative.  Negative for dizziness.  Psychiatric/Behavioral:  Negative for confusion. The patient is not nervous/anxious.     Today's Vitals   08/11/21 1042  BP: 132/70  Pulse: 75  Temp: 97.7 F (36.5 C)  TempSrc: Oral   There is no height or weight on file to calculate BMI.   Objective:  Physical Exam Vitals reviewed.   Constitutional:      General: He is not in acute distress.    Appearance: Normal appearance.  Cardiovascular:     Rate and Rhythm: Normal rate and regular rhythm.     Pulses: Normal pulses.     Heart sounds: Normal heart sounds. No murmur heard. Pulmonary:     Effort: Pulmonary effort is normal. No respiratory distress.     Breath sounds: Normal breath sounds. No wheezing.  Musculoskeletal:     Comments: Right AKA  Skin:    Capillary Refill: Capillary refill takes less than 2 seconds.     Comments: He has a dressing to his left foot that is being managed by his Podiatrist  Neurological:     General: No focal deficit present.     Mental Status: He is alert and oriented to  person, place, and time.     Cranial Nerves: No cranial nerve deficit.     Motor: No weakness.  Psychiatric:        Mood and Affect: Mood normal.        Behavior: Behavior normal.        Thought Content: Thought content normal.        Judgment: Judgment normal.        Assessment And Plan:     1. Controlled type 2 diabetes mellitus with stable proliferative retinopathy of both eyes, with long-term current use of insulin (HCC) Comments: HgbA1c is stable, continue current medications. He would like an order for diabetic shoes last diabetes foot exam was in November - Microalbumin / Creatinine Urine Ratio - POCT Urinalysis Dipstick (81002) - Hemoglobin A1c  2. Acquired hypothyroidism Comments: Controlled, continue current medications.  - TSH - T4 - T3, free  3. Mixed hyperlipidemia Comments: Stable, continue statin, tolerating medications  - Lipid panel - BMP8+EGFR  4. PAD (peripheral artery disease) (HCC)  5. Stage 4 chronic kidney disease ([place])  6. Left ankle swelling Comments: left lower extremity is slightly elevated but stable.   7. Chronic diastolic (congestive) heart failure (HCC) Comments: Stable  8. S/P unilateral BKA (below knee amputation) ([place]) Comments: he is wanting shoes from Dr.  Gibson Ramp size [phone], [phone]    Patient was given opportunity to ask questions. Patient verbalized understanding of the plan and was able to repeat key elements of the plan. All questions were answered to their satisfaction.  [name], [name]   I, [name], [name], have reviewed all documentation for this visit. The documentation on 08/11/21 for the exam, diagnosis, procedures, and orders are all accurate and complete.   IF YOU HAVE BEEN REFERRED TO A SPECIALIST, IT MAY TAKE 1-2 WEEKS TO SCHEDULE/PROCESS THE REFERRAL. IF YOU HAVE NOT HEARD FROM US/SPECIALIST IN TWO WEEKS, PLEASE GIVE [place] A CALL AT [phone].   THE PATIENT IS ENCOURAGED TO PRACTICE SOCIAL DISTANCING DUE TO THE COVID-19 PANDEMIC.

## 2021-08-12 ENCOUNTER — Ambulatory Visit (INDEPENDENT_AMBULATORY_CARE_PROVIDER_SITE_OTHER): Payer: Medicare Other | Admitting: Podiatry

## 2021-08-12 DIAGNOSIS — L97522 Non-pressure chronic ulcer of other part of left foot with fat layer exposed: Secondary | ICD-10-CM

## 2021-08-12 LAB — T4: T4, Total: 6.1 ug/dL (ref 4.5–12.0)

## 2021-08-12 LAB — LIPID PANEL
Chol/HDL Ratio: 3.4 ratio (ref 0.0–5.0)
Cholesterol, Total: 101 mg/dL (ref 100–199)
HDL: 30 mg/dL — ABNORMAL LOW (ref >39)
LDL Chol Calc (NIH): 52 mg/dL (ref 0–99)
Triglycerides: 100 mg/dL (ref 0–149)
VLDL Cholesterol Cal: 19 mg/dL (ref 5–40)

## 2021-08-12 LAB — HEMOGLOBIN A1C
Est. average glucose Bld gHb Est-mCnc: 128 mg/dL
Hgb A1c MFr Bld: 6.1 % — ABNORMAL HIGH (ref 4.8–5.6)

## 2021-08-12 LAB — BMP8+EGFR
BUN/Creatinine Ratio: 29 — ABNORMAL HIGH (ref 10–24)
BUN: 51 mg/dL — ABNORMAL HIGH (ref 8–27)
CO2: 23 mmol/L (ref 20–29)
Calcium: 10.3 mg/dL — ABNORMAL HIGH (ref 8.6–10.2)
Chloride: 103 mmol/L (ref 96–106)
Creatinine, Ser: 1.75 mg/dL — ABNORMAL HIGH (ref 0.76–1.27)
Glucose: 93 mg/dL (ref 70–99)
Potassium: 4.1 mmol/L (ref 3.5–5.2)
Sodium: 139 mmol/L (ref 134–144)
eGFR: 38 mL/min/{1.73_m2} — ABNORMAL LOW (ref >59)

## 2021-08-12 LAB — MICROALBUMIN / CREATININE URINE RATIO
Creatinine, Urine: 39.5 mg/dL
Microalb/Creat Ratio: 368 mg/g creat — ABNORMAL HIGH (ref 0–29)
Microalbumin, Urine: 145.2 ug/mL

## 2021-08-12 LAB — TSH: TSH: 4.4 u[IU]/mL (ref 0.450–4.500)

## 2021-08-12 LAB — T3, FREE: T3, Free: 2.4 pg/mL (ref 2.0–4.4)

## 2021-08-12 NOTE — Progress Notes (Signed)
?  Subjective:  ?Patient ID: Micheal Walls, male    DOB: [birth year],  MRN: [id number] ? ?Chief Complaint  ?Patient presents with  ? Foot Ulcer  ?  wound left ( left foot )  ? ? ? ?DOS: 09/14/2020 ?Procedure: Left hallux amputation ? ?86 y.o. male returns for post-op check.  Still using gentamicin cream feels he is doing okay ? ?Review of Systems: Negative except as noted in the HPI. Denies N/V/F/Ch. ? ? ?Objective:  ? ?There were no vitals filed for this visit. ? ?There is no height or weight on file to calculate BMI. ?Constitutional Well developed. ?Well nourished.  ?Vascular Foot warm and well perfused. ?Capillary refill normal to all digits.   ?Neurologic Normal speech. ?Oriented to person, place, and time. ?Epicritic sensation to light touch grossly present bilaterally.  ?Dermatologic Prior first toe amputation incision dehiscence has completely healed now.   lateral foot ulceration full-thickness with exposed subcutaneous tissues measure 0.8 time 0.3 x0.2, ?Excellent increase in epithelization and reduction in size today.  For toenails thickened elongated brown-yellow discoloration in the left foot  ?Orthopedic:  Minimal tenderness to palpation noted about the surgical site.  ? ? ?ABI 0.7 ? ?Assessment:  ? ?1. Ulcer of left foot with fat layer exposed ([place])   ? ? ? ? ? ? ? ? ?Plan:  ?Patient was evaluated and treated and all questions answered. ? ?Discussed the etiology and treatment options for the condition in detail with the patient. Educated patient on the topical and oral treatment options for mycotic nails. Recommended debridement of the nails today. Sharp and mechanical debridement performed of all painful and mycotic nails today. Nails debrided in length and thickness using a nail nipper to level of comfort. Discussed treatment options including appropriate shoe gear. Follow up as needed for painful nails. ? ? ?Ulcer left foot ?-Hallux amputation site remains healed ?-We discussed the etiology and  factors that are a part of the wound healing process.  We also discussed the risk of infection both soft tissue and osteomyelitis from open ulceration.  Discussed the risk of limb loss if this happens or worsens. ?-Debridement as below. ?-Dressed with Iodosorb, DSD. ?-Continue home dressing changes daily with gauze and gentamicin cream after cleaning with wound cleanser spray ? ? ?Procedure: Excisional Debridement of Wound ?Rationale: Removal of non-viable soft tissue from the wound to promote healing.  ?Anesthesia: none ?Post-Debridement Wound Measurements: 0.8x0.3 x 0.2 cm ?Type of Debridement: Sharp Excisional ?Tissue Removed: Non-viable soft tissue ?Depth of Debridement: subcutaneous tissue. ?Technique: Sharp excisional debridement to bleeding, viable wound base.  ?Dressing: Dry, sterile, compression dressing. ?Disposition: Patient tolerated procedure well.  ? ? ?   ? ?   ? ? ?Return in about 4 weeks (around 09/09/2021) for wound care.   ?

## 2021-08-13 ENCOUNTER — Telehealth: Payer: Medicare Other

## 2021-08-13 ENCOUNTER — Ambulatory Visit (INDEPENDENT_AMBULATORY_CARE_PROVIDER_SITE_OTHER): Payer: Medicare Other

## 2021-08-13 DIAGNOSIS — Z794 Long term (current) use of insulin: Secondary | ICD-10-CM

## 2021-08-13 DIAGNOSIS — E1159 Type 2 diabetes mellitus with other circulatory complications: Secondary | ICD-10-CM

## 2021-08-13 DIAGNOSIS — N184 Chronic kidney disease, stage 4 (severe): Secondary | ICD-10-CM

## 2021-08-13 DIAGNOSIS — I739 Peripheral vascular disease, unspecified: Secondary | ICD-10-CM

## 2021-08-13 DIAGNOSIS — E039 Hypothyroidism, unspecified: Secondary | ICD-10-CM

## 2021-08-13 NOTE — Chronic Care Management (AMB) (Signed)
Chronic Care Management   CCM RN Visit Note  08/13/2021 Name: Micheal Walls MRN: [id number] DOB: [birth year]  Subjective: Micheal Walls is a 86 y.o. year old male who is a primary care patient of [name], [place]. The care management team was consulted for assistance with disease management and care coordination needs.    Engaged with patient by telephone for follow up visit in response to provider referral for case management and/or care coordination services.   Consent to Services:  The patient was given information about Chronic Care Management services, agreed to services, and gave verbal consent prior to initiation of services.  Please see initial visit note for detailed documentation.   Patient agreed to services and verbal consent obtained.   Assessment: Review of patient past medical history, allergies, medications, health status, including review of consultants reports, laboratory and other test data, was performed as part of comprehensive evaluation and provision of chronic care management services.   SDOH (Social Determinants of Health) assessments and interventions performed:  Yes, no acute challenges   CCM Care Plan  Allergies  Allergen Reactions   Vancomycin Rash    Outpatient Encounter Medications as of 08/13/2021  Medication Sig   allopurinol (ZYLOPRIM) 100 MG tablet Take 100 mg by mouth 2 (two) times daily.   aspirin EC 81 MG tablet Take 81 mg by mouth daily. Swallow whole.   colchicine 0.6 MG tablet Take 0.6 mg by mouth once a week.   Continuous Blood Gluc Sensor (FREESTYLE LIBRE 14 DAY SENSOR) MISC Use as directed to check blood sugars   diltiazem (CARDIZEM CD) 240 MG 24 hr capsule Take 1 capsule (240 mg total) by mouth daily.   dorzolamide (TRUSOPT) 2 % ophthalmic solution Place 1 drop into both eyes 2 (two) times daily.   ferrous sulfate 325 (65 FE) MG tablet Take 325 mg by mouth 2 (two) times daily with a meal.   furosemide (LASIX) 40 MG tablet Take 1  tablet in the morning and one in the evening   gabapentin (NEURONTIN) 100 MG capsule TAKE TWO CAPSULES BY MOUTH EVERY MORNING and TAKE ONE CAPSULE BY MOUTH AT NOON and TAKE ONE CAPSULE BY MOUTH EVERY EVENING   gentamicin cream (GARAMYCIN) 0.1 % Apply 1 application topically daily. To foot wound with dressing changes   glucose blood (FREESTYLE PRECISION NEO TEST) test strip Use as instructed   ketotifen (ZADITOR) 0.025 % ophthalmic solution Place 1 drop into both eyes 2 (two) times daily.   latanoprost (XALATAN) 0.005 % ophthalmic solution Place 1 drop into both eyes at bedtime.    Multiple Vitamins-Minerals (MULTIVITAMIN WITH MINERALS) tablet Take 1 tablet by mouth daily. Centrum men 50+   Multiple Vitamins-Minerals (PRESERVISION AREDS) CAPS Take 1 capsule by mouth 2 (two) times daily.   pantoprazole (PROTONIX) 40 MG tablet Take 1 tablet (40 mg total) by mouth daily.   Polyvinyl Alcohol-Povidone PF (REFRESH) 1.4-0.6 % SOLN Place 1 drop into both eyes 2 (two) times daily.   rosuvastatin (CRESTOR) 20 MG tablet Take 1 tablet (20 mg total) by mouth daily.   Semaglutide, [name], (OZEMPIC, [name],) 2 MG/1.5ML SOPN Inject 1 mg into the skin once a week.   senna-docusate (SENOKOT-S) 8.6-50 MG tablet Take 1 tablet by mouth 2 (two) times daily between meals as needed for mild constipation or moderate constipation. (Patient taking differently: Take 1 tablet by mouth 2 (two) times daily.)   SYNTHROID 25 MCG tablet Take 1 tablet by mouth Monday - Friday  tamsulosin (FLOMAX) 0.4 MG CAPS capsule Take 0.4 mg by mouth daily.   No facility-administered encounter medications on file as of 08/13/2021.    Patient Active Problem List   Diagnosis Date Noted   Chronic diastolic (congestive) heart failure ([place]) 08/11/2021   Pseudophakia of both eyes 04/12/2021   Infection of amputation stump of left lower extremity ([place]) 10/21/2020   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[place])    Left ankle swelling    Diabetic foot  infection ([place])    Cerebral thrombosis with cerebral infarction 07/20/2020   Thrombocytopenia ([place]) 07/19/2020   Cerebral ischemia 07/18/2020   Bilateral hearing loss 05/20/2020   Does use hearing aid 05/20/2020   Ulcer of left foot with fat layer exposed ([place]) 01/16/2020   Controlled type 2 diabetes mellitus with stable proliferative retinopathy of both eyes, with long-term current use of insulin ([place]) 01/06/2020   Intermediate stage nonexudative age-related macular degeneration of both eyes 01/06/2020   Left epiretinal membrane 01/06/2020   [place] of right macula 01/06/2020   Cellulitis 11/02/2019   Elevated TSH 07/10/2018   Nephropathy 02/28/2018   Disturbance of skin sensation 02/28/2018   PAD (peripheral artery disease) ([place]) 07/17/2017   110.1 10/21/2013   Epiphora 04/24/2013   Laxity of eyelid 04/24/2013   Anemia 04/09/2013   History of peripheral vascular disease 04/09/2013   History of stroke 04/09/2013   S/P unilateral BKA (below knee amputation) ([place]) 03/17/2013   Diabetes mellitus type 2 in obese ([place]) 03/17/2013   Mixed hyperlipidemia 03/17/2013   Essential hypertension 03/17/2013   Gout 03/17/2013   CKD (chronic kidney disease), stage III ([place]) 03/17/2013   OSA on CPAP 03/17/2013   Anemia, iron deficiency 03/17/2013   Diverticulosis of colon 03/17/2013   RBBB 03/17/2013   Punctal stenosis, acquired 10/02/2012    Conditions to be addressed/monitored: DM II, PAD, CKD III, Hypothyroidism   Care Plan : RN Care Manager Plan of Care  Updates made by Lynne Logan, RN since 08/13/2021 12:00 AM     Problem: Chronic disease education and Care Coordination needs for PAD, Cellulitis of left lower extremity, Neuropathy, Mixed hyperlipidemia, Type 2 diabetes mellitus, Stage 4 CKD   Priority: High     Long-Range Goal: Establish Plan for Chronic disease education and Care Coordination needs for PAD, Cellulitis of left lower extremity, Neuropathy, Mixed hyperlipidemia, Type  2 diabetes mellitus, Stage 4 CKD   Start Date: 03/16/2021  Expected End Date: 03/16/2022  Recent Progress: On track  Priority: High  Note:   Current Barriers:  Knowledge Deficits related to plan of care for management of PAD, Cellulitis of left lower extremity, Neuropathy, Mixed hyperlipidemia, Type 2 diabetes mellitus, Stage 4 CKD  Chronic Disease Management support and education needs related to PAD, Cellulitis of left lower extremity, Neuropathy, Mixed hyperlipidemia, Type 2 diabetes mellitus, Stage 4 CKD   RNCM Clinical Goal(s):  Patient will verbalize understanding of plan for management of PAD, Cellulitis of left lower extremity, Neuropathy, Mixed hyperlipidemia, Type 2 diabetes mellitus  demonstrate Improved health management independence   continue to work with RN Care Manager to address care management and care coordination needs related to  PAD, Cellulitis of left lower extremity, Neuropathy, Mixed hyperlipidemia, Type 2 diabetes mellitus through collaboration with RN Care manager, provider, and care team.   Interventions: 1:1 collaboration with primary care provider regarding development and update of comprehensive plan of care as evidenced by provider attestation and co-signature Inter-disciplinary care team collaboration (see longitudinal plan of care) Evaluation of current  treatment plan related to  self management and patient's adherence to plan as established by provider  [place] Interventions: Condition stable.  Not addressed this visit. Evaluation of current treatment plan related to  Cellulitis of left lower extremity , self-management and patient's adherence to plan as established by provider. Determined patient continues to have a small ulceration to his left later foot being managed by wound clinic and Vascular  Discussed recommendations for wound care every other day per wound MD, patient is adhering and is able to self perform dressing changes with the help of his wife  as needed Educated patient on sign/symptoms of infection and when to notify wound MD of changes if they occur Educated patient on importance of adequate daily glycemic control to aid in healing  Educated patient on importance of adequate protein to help aid in wound healing  Discussed importance to keep all scheduled MD appointments as directed in order to identify wound status change promptly  Discussed plans with patient for ongoing care management follow up and provided patient with direct contact information for care management team    Chronic Kidney Disease Interventions:  (Status:  Goal on track:  Yes.) Long Term Goal Assessed the Patient understanding of chronic kidney disease    Evaluation of current treatment plan related to chronic kidney disease self management and patient's adherence to plan as established by provider      Reviewed prescribed diet continue to increase daily water intake to 48-64 oz daily unless otherwise directed  Provided education on kidney disease progression    Review of patient status, including review of consultant's reports, relevant laboratory and other test results, and medications completed Discussed plans with patient for ongoing care management follow up and provided patient with direct contact information for care management team Last practice recorded BP readings:  BP Readings from Last 3 Encounters:  08/11/21 132/70  07/13/21 136/64  07/01/21 136/69  Most recent eGFR/CrCl:  Lab Results  Component Value Date   EGFR 38 (L) 08/11/2021    No components found for: CRCL   Diabetes Interventions:  (Status:  Goal on track:  Yes.) Long Term Goal Assessed patient's understanding of A1c goal:  <5.7% Review of patient status, including review of consultant's reports, relevant laboratory and other test results, and medications completed Praised patient for making efforts to lower his A1c Advised patient, providing education and rationale, to check cbg  daily before meals and record, calling PCP for findings outside established parameters Review of patient status, including review of consultants reports, relevant laboratory and other test results, and medications completed Mailed printed educational materials related Chair Exercises and HDL foods  Lab Results  Component Value Date   HGBA1C 6.1 (H) 08/11/2021   Patient Goals/Self-Care Activities: Take all medications as prescribed Attend all scheduled provider appointments Call pharmacy for medication refills 3-7 days in advance of running out of medications Call provider office for new concerns or questions  drink 6 to 8 glasses of water each day eat fish at least once per week manage portion size  Follow Up Plan:  Telephone follow up appointment with care management team member scheduled for:  /23      Barb Merino, RN, BSN, CCM Care Management Coordinator [facility]  Direct Phone: [phone]

## 2021-08-13 NOTE — Patient Instructions (Signed)
Visit Information ? ?Thank you for taking time to visit with me today. Please don't hesitate to contact me if I can be of assistance to you before our next scheduled telephone appointment. ? ?Following are the goals we discussed today:  ?(Copy and paste patient goals from clinical care plan here) ? ?Our next appointment is by telephone on 10/13/21 at 10:30 AM ? ?Please call the care guide team at [phone] if you need to cancel or reschedule your appointment.  ? ?If you are experiencing a Mental Health or [place] or need someone to talk to, please call [phone]-TALK (toll free, 24 hour hotline)  ? ?Patient verbalizes understanding of instructions and care plan provided today and agrees to view in [place]. Active MyChart status confirmed with patient.   ? ? ?Barb Merino, RN, BSN, CCM ?Care Management Coordinator ?[facility]  ?Direct Phone: [phone] ? ? ?

## 2021-08-18 ENCOUNTER — Telehealth: Payer: Self-pay

## 2021-08-18 NOTE — Chronic Care Management (AMB) (Unsigned)
Chronic Care Management Pharmacy Assistant   Name: Micheal Walls  MRN: [id number] DOB: Oct 02, 1935  Reason for Encounter: Disease State/ General  Recent office visits:  08-13-2021 Micheal Logan, RN (CCM).  08-11-2021 Micheal Brine, [name]. BUN= 51, Creatinine= 1.75, eGFR= 38, BUN/Creatinine= 29, Calcium= 10.3. A1C= 6.1. HDL= 30. Microalb/Creat Ratio= 368. Abnormal UA.  08-02-2021 Micheal Walls (CCM)  Recent consult visits:  08-12-2021 Micheal Walls, DPM (Podiatry). Dressing change on ulcer of left foot.  07-22-2021 Micheal Walls, DPM (Podiatry). Dressing change on ulcer of left foot.  07-13-2021 Micheal Rider, NP (Neurology). Follow up visit. No changes.   Hospital visits:  None in previous 6 months  Medications: Outpatient Encounter Medications as of 08/18/2021  Medication Sig   allopurinol (ZYLOPRIM) 100 MG tablet Take 100 mg by mouth 2 (two) times daily.   aspirin EC 81 MG tablet Take 81 mg by mouth daily. Swallow whole.   colchicine 0.6 MG tablet Take 0.6 mg by mouth once a week.   Continuous Blood Gluc Sensor (FREESTYLE LIBRE 14 DAY SENSOR) MISC Use as directed to check blood sugars   diltiazem (CARDIZEM CD) 240 MG 24 hr capsule Take 1 capsule (240 mg total) by mouth daily.   dorzolamide (TRUSOPT) 2 % ophthalmic solution Place 1 drop into both eyes 2 (two) times daily.   ferrous sulfate 325 (65 FE) MG tablet Take 325 mg by mouth 2 (two) times daily with a meal.   furosemide (LASIX) 40 MG tablet Take 1 tablet in the morning and one in the evening   gabapentin (NEURONTIN) 100 MG capsule TAKE TWO CAPSULES BY MOUTH EVERY MORNING and TAKE ONE CAPSULE BY MOUTH AT NOON and TAKE ONE CAPSULE BY MOUTH EVERY EVENING   gentamicin cream (GARAMYCIN) 0.1 % Apply 1 application topically daily. To foot wound with dressing changes   glucose blood (FREESTYLE PRECISION NEO TEST) test strip Use as instructed   ketotifen (ZADITOR) 0.025 % ophthalmic solution Place 1 drop into both  eyes 2 (two) times daily.   latanoprost (XALATAN) 0.005 % ophthalmic solution Place 1 drop into both eyes at bedtime.    Multiple Vitamins-Minerals (MULTIVITAMIN WITH MINERALS) tablet Take 1 tablet by mouth daily. Centrum men 50+   Multiple Vitamins-Minerals (PRESERVISION AREDS) CAPS Take 1 capsule by mouth 2 (two) times daily.   pantoprazole (PROTONIX) 40 MG tablet Take 1 tablet (40 mg total) by mouth daily.   Polyvinyl Alcohol-Povidone PF (REFRESH) 1.4-0.6 % SOLN Place 1 drop into both eyes 2 (two) times daily.   rosuvastatin (CRESTOR) 20 MG tablet Take 1 tablet (20 mg total) by mouth daily.   Semaglutide, [name], (OZEMPIC, [name],) 2 MG/1.5ML SOPN Inject 1 mg into the skin once a week.   senna-docusate (SENOKOT-S) 8.6-50 MG tablet Take 1 tablet by mouth 2 (two) times daily between meals as needed for mild constipation or moderate constipation. (Patient taking differently: Take 1 tablet by mouth 2 (two) times daily.)   SYNTHROID 25 MCG tablet Take 1 tablet by mouth Monday - Friday   tamsulosin (FLOMAX) 0.4 MG CAPS capsule Take 0.4 mg by mouth daily.   No facility-administered encounter medications on file as of 08/18/2021.  Contacted Micheal Walls for general disease state and medication adherence call.   Patient is > 5 days past due for refill on the following medications per chart history:  What concerns do you have about your medications?  The patient {denies/reports:25180} side effects with his medications.   How often  do you forget or accidentally miss a dose? {missed doses:25554}  Do you use a pillbox? {yes/no:20286}  Are you having any problems getting your medications from your pharmacy? {yes/no:20286}  Has the cost of your medications been a concern? {yes/no:20286} If yes, what medication and is patient assistance available or has it been applied for?  Since last visit with CPP, {no/thefollowing:25210} interventions have been made:   The patient has not had an ED  visit since last contact.   The patient {denies/reports:25180} problems with their health.   he {denies/reports:25180}  concerns or questions for Micheal Walls, at this time.   Patient states BP and BG readings are as follows***  Fasting: *** Before meals: *** After meals: *** Bedtime: ***  Counseled patient on: ***only leave if you discussed with patient   Micheal Walls job taking medications! (If good adherence)***  Importance of taking medication daily without missed doses  Benefits of adherence packaging or a pillbox   Access to CCM team for any cost, medication, or pharmacy concerns  08-18-2021: 1st attempt no VM  Care Gaps: Shingrix overdue Last AWV 05-20-2022  Star Rating Drugs: Rosuvastatin 20 mg- Last filled 07-22-2021 90 [facility] Ozempic 1 mg- Last filled 01-27-2021 90 [facility] mail order [id number]  [facility] [place] Pharmacist Assistant [phone]

## 2021-09-07 DIAGNOSIS — Z20822 Contact with and (suspected) exposure to covid-19: Secondary | ICD-10-CM | POA: Diagnosis not present

## 2021-09-10 DIAGNOSIS — E1159 Type 2 diabetes mellitus with other circulatory complications: Secondary | ICD-10-CM | POA: Diagnosis not present

## 2021-09-10 DIAGNOSIS — E039 Hypothyroidism, unspecified: Secondary | ICD-10-CM | POA: Diagnosis not present

## 2021-09-10 DIAGNOSIS — Z794 Long term (current) use of insulin: Secondary | ICD-10-CM | POA: Diagnosis not present

## 2021-09-10 DIAGNOSIS — N184 Chronic kidney disease, stage 4 (severe): Secondary | ICD-10-CM | POA: Diagnosis not present

## 2021-09-14 ENCOUNTER — Ambulatory Visit (INDEPENDENT_AMBULATORY_CARE_PROVIDER_SITE_OTHER): Payer: Medicare Other | Admitting: Podiatry

## 2021-09-14 DIAGNOSIS — L97522 Non-pressure chronic ulcer of other part of left foot with fat layer exposed: Secondary | ICD-10-CM | POA: Diagnosis not present

## 2021-09-14 MED ORDER — GENTAMICIN SULFATE 0.1 % EX CREA: 1.0000 "application " | TOPICAL_CREAM | Freq: Every day | CUTANEOUS | 1 refills | Status: DC

## 2021-09-18 ENCOUNTER — Encounter: Payer: Self-pay | Admitting: Podiatry

## 2021-09-18 NOTE — Progress Notes (Signed)
?  Subjective:  ?Patient ID: Micheal Walls, male    DOB: [birth year],  MRN: [id number] ? ?Chief Complaint  ?Patient presents with  ? Foot Ulcer  ?  4 week follow up left foot  ? ? ? ?DOS: 09/14/2020 ?Procedure: Left hallux amputation ? ?86 y.o. male returns for post-op check.  Still using gentamicin cream feels he is doing okay ? ?Review of Systems: Negative except as noted in the HPI. Denies N/V/F/Ch. ? ? ?Objective:  ? ?There were no vitals filed for this visit. ? ?There is no height or weight on file to calculate BMI. ?Constitutional Well developed. ?Well nourished.  ?Vascular Foot warm and well perfused. ?Capillary refill normal to all digits.   ?Neurologic Normal speech. ?Oriented to person, place, and time. ?Epicritic sensation to light touch grossly present bilaterally.  ?Dermatologic Prior first toe amputation incision dehiscence has completely healed now.   lateral foot ulceration full-thickness with exposed subcutaneous tissues measure 0.4 x 0.2 x 0.2 cm, ?Excellent increase in epithelization and reduction in size today.  For toenails thickened elongated brown-yellow discoloration in the left foot  ?Orthopedic:  Minimal tenderness to palpation noted about the surgical site.  ? ? ? ? ?ABI 0.7 ? ?Assessment:  ? ?1. Ulcer of left foot with fat layer exposed ([place])   ? ? ? ? ? ? ? ? ?Plan:  ?Patient was evaluated and treated and all questions answered. ? ?Discussed the etiology and treatment options for the condition in detail with the patient. Educated patient on the topical and oral treatment options for mycotic nails. Recommended debridement of the nails today. Sharp and mechanical debridement performed of all painful and mycotic nails today. Nails debrided in length and thickness using a nail nipper to level of comfort. Discussed treatment options including appropriate shoe gear. Follow up as needed for painful nails. ? ? ?Ulcer left foot ?-Hallux amputation site remains healed ?-We discussed the etiology  and factors that are a part of the wound healing process.  We also discussed the risk of infection both soft tissue and osteomyelitis from open ulceration.  Discussed the risk of limb loss if this happens or worsens. ?-Debridement as below. ?-Dressed with Iodosorb, DSD. ?-Continue home dressing changes daily with gauze and gentamicin cream after cleaning with wound cleanser spray ? ? ?Procedure: Excisional Debridement of Wound ?Rationale: Removal of non-viable soft tissue from the wound to promote healing.  ?Anesthesia: none ?Post-Debridement Wound Measurements: 0.4 x 0.2 x 0.2 cm ?Type of Debridement: Sharp Excisional ?Tissue Removed: Non-viable soft tissue ?Depth of Debridement: subcutaneous tissue. ?Technique: Sharp excisional debridement to bleeding, viable wound base.  ?Dressing: Dry, sterile, compression dressing. ?Disposition: Patient tolerated procedure well.  ? ? ?   ? ?   ? ? ?Return in about 4 weeks (around 10/12/2021) for wound care.   ?

## 2021-09-28 ENCOUNTER — Encounter: Payer: Self-pay | Admitting: Nurse Practitioner

## 2021-09-30 ENCOUNTER — Ambulatory Visit (INDEPENDENT_AMBULATORY_CARE_PROVIDER_SITE_OTHER): Payer: Medicare Other

## 2021-09-30 DIAGNOSIS — I739 Peripheral vascular disease, unspecified: Secondary | ICD-10-CM

## 2021-09-30 DIAGNOSIS — Z794 Long term (current) use of insulin: Secondary | ICD-10-CM

## 2021-09-30 NOTE — Chronic Care Management (AMB) (Signed)
?Chronic Care Management  ? ? Social Work Note ? ?09/30/2021 ?Name: Micheal Walls MRN: [id number] DOB: [birth year] ? ?Micheal Walls is a 86 y.o. year old male who is a primary care patient of Micheal Walls, [place]. The CCM team was consulted to assist the patient with chronic disease management and/or care coordination needs related to:  PAD, DM II .  ? ?Engaged with [name] by telephone  for follow up visit in response to provider referral for social work chronic care management and care coordination services.  ? ?Consent to Services:  ?The patient was given information about Chronic Care Management services, agreed to services, and gave verbal consent prior to initiation of services.  Please see initial visit note for detailed documentation.  ? ?Patient agreed to services and consent obtained.  ? ?Assessment: Review of patient past medical history, allergies, medications, and health status, including review of relevant consultants reports was performed today as part of a comprehensive evaluation and provision of chronic care management and care coordination services.    ? ?SDOH (Social Determinants of Health) assessments and interventions performed:   ? ?Advanced Directives Status: Not addressed in this encounter. ? ?CCM Care Plan ? ?Allergies  ?Allergen Reactions  ? Vancomycin Rash  ? ? ?Outpatient Encounter Medications as of 09/30/2021  ?Medication Sig  ? allopurinol (ZYLOPRIM) 100 MG tablet Take 100 mg by mouth 2 (two) times daily.  ? aspirin EC 81 MG tablet Take 81 mg by mouth daily. Swallow whole.  ? colchicine 0.6 MG tablet Take 0.6 mg by mouth once a week.  ? Continuous Blood Gluc Sensor (FREESTYLE LIBRE 14 DAY SENSOR) MISC Use as directed to check blood sugars  ? diltiazem (CARDIZEM CD) 240 MG 24 hr capsule Take 1 capsule (240 mg total) by mouth daily.  ? dorzolamide (TRUSOPT) 2 % ophthalmic solution Place 1 drop into both eyes 2 (two) times daily.  ? ferrous sulfate 325 (65 FE) MG  tablet Take 325 mg by mouth 2 (two) times daily with a meal.  ? furosemide (LASIX) 40 MG tablet Take 1 tablet in the morning and one in the evening  ? gabapentin (NEURONTIN) 100 MG capsule TAKE TWO CAPSULES BY MOUTH EVERY MORNING and TAKE ONE CAPSULE BY MOUTH AT NOON and TAKE ONE CAPSULE BY MOUTH EVERY EVENING  ? gentamicin cream (GARAMYCIN) 0.1 % Apply 1 application. topically daily. To foot wound with dressing changes  ? glucose blood (FREESTYLE PRECISION NEO TEST) test strip Use as instructed  ? ketotifen (ZADITOR) 0.025 % ophthalmic solution Place 1 drop into both eyes 2 (two) times daily.  ? latanoprost (XALATAN) 0.005 % ophthalmic solution Place 1 drop into both eyes at bedtime.   ? Multiple Vitamins-Minerals (MULTIVITAMIN WITH MINERALS) tablet Take 1 tablet by mouth daily. Centrum men 50+  ? Multiple Vitamins-Minerals (PRESERVISION AREDS) CAPS Take 1 capsule by mouth 2 (two) times daily.  ? pantoprazole (PROTONIX) 40 MG tablet Take 1 tablet (40 mg total) by mouth daily.  ? Polyvinyl Alcohol-Povidone PF (REFRESH) 1.4-0.6 % SOLN Place 1 drop into both eyes 2 (two) times daily.  ? rosuvastatin (CRESTOR) 20 MG tablet Take 1 tablet (20 mg total) by mouth daily.  ? Semaglutide, [name], (OZEMPIC, [name],) 2 MG/1.5ML SOPN Inject 1 mg into the skin once a week.  ? senna-docusate (SENOKOT-S) 8.6-50 MG tablet Take 1 tablet by mouth 2 (two) times daily between meals as needed for mild constipation or moderate constipation. (Patient taking differently: Take 1  tablet by mouth 2 (two) times daily.)  ? SYNTHROID 25 MCG tablet Take 1 tablet by mouth Monday - Friday  ? tamsulosin (FLOMAX) 0.4 MG CAPS capsule Take 0.4 mg by mouth daily.  ? ?No facility-administered encounter medications on file as of 09/30/2021.  ? ? ?Patient Active Problem List  ? Diagnosis Date Noted  ? Chronic diastolic (congestive) heart failure ([place]) 08/11/2021  ? Pseudophakia of both eyes 04/12/2021  ? Infection of amputation stump of left lower  extremity ([place]) 10/21/2020  ? 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[place])   ? Left ankle swelling   ? Diabetic foot infection ([place])   ? Cerebral thrombosis with cerebral infarction 07/20/2020  ? Thrombocytopenia ([place]) 07/19/2020  ? Cerebral ischemia 07/18/2020  ? Bilateral hearing loss 05/20/2020  ? Does use hearing aid 05/20/2020  ? Ulcer of left foot with fat layer exposed ([place]) 01/16/2020  ? Controlled type 2 diabetes mellitus with stable proliferative retinopathy of both eyes, with long-term current use of insulin ([place]) 01/06/2020  ? Intermediate stage nonexudative age-related macular degeneration of both eyes 01/06/2020  ? Left epiretinal membrane 01/06/2020  ? [place] of right macula 01/06/2020  ? Cellulitis 11/02/2019  ? Elevated TSH 07/10/2018  ? Nephropathy 02/28/2018  ? Disturbance of skin sensation 02/28/2018  ? PAD (peripheral artery disease) ([place]) 07/17/2017  ? 110.1 10/21/2013  ? Epiphora 04/24/2013  ? Laxity of eyelid 04/24/2013  ? Anemia 04/09/2013  ? History of peripheral vascular disease 04/09/2013  ? History of stroke 04/09/2013  ? S/P unilateral BKA (below knee amputation) ([place]) 03/17/2013  ? Diabetes mellitus type 2 in obese ([place]) 03/17/2013  ? Mixed hyperlipidemia 03/17/2013  ? Essential hypertension 03/17/2013  ? Gout 03/17/2013  ? CKD (chronic kidney disease), stage III ([place]) 03/17/2013  ? OSA on CPAP 03/17/2013  ? Anemia, iron deficiency 03/17/2013  ? Diverticulosis of colon 03/17/2013  ? RBBB 03/17/2013  ? Punctal stenosis, acquired 10/02/2012  ? ? ?Conditions to be addressed/monitored: DMII and PAD ? ?Care Plan : Social Work [facility] Plan of Care  ?Updates made by [name] since 09/30/2021 12:00 AM  ?Completed 09/30/2021  ? ?Problem: Mobility and Independence Resolved 09/30/2021  ?  ? ?Long-Range Goal: Mobility and Independence Optimized Completed 09/30/2021  ?Start Date: 08/24/2020  ?Recent Progress: On track  ?Priority: High  ?Note:   ?Current Barriers:  ?Chronic disease management support and education  needs related to DM and PAD   ?Limited access to caregiver ?Unable to independently prepare meals ? ?Social Worker Clinical Goal(s):  ?patient will work with SW to identify and address any acute and/or chronic care coordination needs related to the self health management of DM and PAD   ?the patient and his daughter will work with SW to identify caregiver resources for in the home ? the patient will be placed on the wait list for mobile meals Goal Met ? ?CCM SW Interventions: Completed with the [name] ?Inter-disciplinary care team collaboration (see longitudinal plan of care) ?Collaboration with Micheal Brine, [name] regarding development and update of comprehensive plan of care as evidenced by provider attestation and co-signature ?Telephonic visit completed with the [name] to assess for care coordination needs ?Discussed the patient remains on the wait list for the in-home aid program - patient is aware to engage with this program when contacted ?Reviewed the patient is doing well overall in the home with no acute needs identified ?Encouraged Mrs. [name] to contact SW as needed with future care coordination needs ?Goal  closed ? ?Patient Goals/Self-Care Activities ? patient will: With the help of his children ? - Continue to receive mobile meals - drivers are to call patient prior to arriving so he may prepare to answer the door ?-Adhere to Dr. Vivia Ewing recommendations regarding foot care ?-Engage with [facility] [place] when contacted to initiate services ?-Contact SW as needed ? ? ?  ?  ? ?Follow Up Plan:  No SW follow up planned at this time. The patient will remain engaged with RN Care Manager to address care management needs. SW is available as needed to assist with care coordination needs. Mrs. [name] will contact SW if needed. ?     ?[name], [name], CDP ?Social Worker, Certified Dementia Practitioner ?[facility] / [facility] ?[phone] ? ?   ? ? ? ? ?

## 2021-09-30 NOTE — Patient Instructions (Signed)
Social Worker Visit Information ? ?Goals we discussed today:  ? Goals Addressed   ? ?  ?  ?  ?  ? This Visit's Progress  ?  COMPLETED: Mobility and Independence Optimized     ?  Timeframe:  Long-Range Goal ?Priority:  High ?Start Date:  3.14.22                          ?               ?Patient Goals/Self-Care Activities ? patient will: With the help of his children ? - Continue to receive mobile meals - drivers are to call patient prior to arriving so he may prepare to answer the door ?-Adhere to Dr. Vivia Ewing recommendations regarding foot care ?-Engage with [facility] when contacted to initiate services ?-Contact SW as needed ?  ? ?  ?  ? ?Patient verbalizes understanding of instructions and care plan provided today and agrees to view in [place]. Active MyChart status confirmed with patient.   ? ?Follow Up Plan:  No follow up planned at this time. Please contact me as needed. ? ? ?[name], [name], CDP ?Social Worker, Certified Dementia Practitioner ?[facility] / [facility] ?[phone] ? ?   ? ?

## 2021-10-04 DIAGNOSIS — Z6841 Body Mass Index (BMI) 40.0 and over, adult: Secondary | ICD-10-CM | POA: Diagnosis not present

## 2021-10-04 DIAGNOSIS — M109 Gout, unspecified: Secondary | ICD-10-CM | POA: Diagnosis not present

## 2021-10-04 DIAGNOSIS — I129 Hypertensive chronic kidney disease with stage 1 through stage 4 chronic kidney disease, or unspecified chronic kidney disease: Secondary | ICD-10-CM | POA: Diagnosis not present

## 2021-10-04 DIAGNOSIS — E118 Type 2 diabetes mellitus with unspecified complications: Secondary | ICD-10-CM | POA: Diagnosis not present

## 2021-10-04 DIAGNOSIS — E785 Hyperlipidemia, unspecified: Secondary | ICD-10-CM | POA: Diagnosis not present

## 2021-10-04 DIAGNOSIS — N183 Chronic kidney disease, stage 3 unspecified: Secondary | ICD-10-CM | POA: Diagnosis not present

## 2021-10-04 DIAGNOSIS — N3289 Other specified disorders of bladder: Secondary | ICD-10-CM | POA: Diagnosis not present

## 2021-10-10 DIAGNOSIS — E1159 Type 2 diabetes mellitus with other circulatory complications: Secondary | ICD-10-CM

## 2021-10-10 DIAGNOSIS — Z794 Long term (current) use of insulin: Secondary | ICD-10-CM

## 2021-10-11 ENCOUNTER — Encounter (INDEPENDENT_AMBULATORY_CARE_PROVIDER_SITE_OTHER): Payer: Medicare Other | Admitting: Ophthalmology

## 2021-10-11 ENCOUNTER — Other Ambulatory Visit: Payer: Self-pay

## 2021-10-11 ENCOUNTER — Telehealth: Payer: Self-pay

## 2021-10-11 DIAGNOSIS — G629 Polyneuropathy, unspecified: Secondary | ICD-10-CM

## 2021-10-11 DIAGNOSIS — I739 Peripheral vascular disease, unspecified: Secondary | ICD-10-CM

## 2021-10-11 MED ORDER — GABAPENTIN 100 MG PO CAPS: ORAL_CAPSULE | ORAL | 1 refills | Status: DC

## 2021-10-11 NOTE — Chronic Care Management (AMB) (Signed)
? ? ?Chronic Care Management ?Pharmacy Assistant  ? ?Name: Micheal Walls  MRN: [id number] DOB: 06-Aug-1935 ? ?Reason for Encounter: Medication Review/ Medication coordination ? ?Recent office visits:  ?09-30-2021 [name] (CCM) ? ?Recent consult visits:  ?09-14-2021 [name], DPM (Podiatry). Excisional Debridement of Wound. Follow up in 4 weeks. ? ?Hospital visits:  ?None in previous 6 months ? ?Medications: ?Outpatient Encounter Medications as of 10/11/2021  ?Medication Sig  ? allopurinol (ZYLOPRIM) 100 MG tablet Take 100 mg by mouth 2 (two) times daily.  ? aspirin EC 81 MG tablet Take 81 mg by mouth daily. Swallow whole.  ? colchicine 0.6 MG tablet Take 0.6 mg by mouth once a week.  ? Continuous Blood Gluc Sensor (FREESTYLE LIBRE 14 DAY SENSOR) MISC Use as directed to check blood sugars  ? diltiazem (CARDIZEM CD) 240 MG 24 hr capsule Take 1 capsule (240 mg total) by mouth daily.  ? dorzolamide (TRUSOPT) 2 % ophthalmic solution Place 1 drop into both eyes 2 (two) times daily.  ? ferrous sulfate 325 (65 FE) MG tablet Take 325 mg by mouth 2 (two) times daily with a meal.  ? furosemide (LASIX) 40 MG tablet Take 1 tablet in the morning and one in the evening  ? gabapentin (NEURONTIN) 100 MG capsule TAKE TWO CAPSULES BY MOUTH EVERY MORNING and TAKE ONE CAPSULE BY MOUTH AT NOON and TAKE ONE CAPSULE BY MOUTH EVERY EVENING  ? gentamicin cream (GARAMYCIN) 0.1 % Apply 1 application. topically daily. To foot wound with dressing changes  ? glucose blood (FREESTYLE PRECISION NEO TEST) test strip Use as instructed  ? ketotifen (ZADITOR) 0.025 % ophthalmic solution Place 1 drop into both eyes 2 (two) times daily.  ? latanoprost (XALATAN) 0.005 % ophthalmic solution Place 1 drop into both eyes at bedtime.   ? Multiple Vitamins-Minerals (MULTIVITAMIN WITH MINERALS) tablet Take 1 tablet by mouth daily. Centrum men 50+  ? Multiple Vitamins-Minerals (PRESERVISION AREDS) CAPS Take 1 capsule by mouth 2 (two) times daily.   ? pantoprazole (PROTONIX) 40 MG tablet Take 1 tablet (40 mg total) by mouth daily.  ? Polyvinyl Alcohol-Povidone PF (REFRESH) 1.4-0.6 % SOLN Place 1 drop into both eyes 2 (two) times daily.  ? rosuvastatin (CRESTOR) 20 MG tablet Take 1 tablet (20 mg total) by mouth daily.  ? Semaglutide, [name], (OZEMPIC, [name],) 2 MG/1.5ML SOPN Inject 1 mg into the skin once a week.  ? senna-docusate (SENOKOT-S) 8.6-50 MG tablet Take 1 tablet by mouth 2 (two) times daily between meals as needed for mild constipation or moderate constipation. (Patient taking differently: Take 1 tablet by mouth 2 (two) times daily.)  ? SYNTHROID 25 MCG tablet Take 1 tablet by mouth Monday - Friday  ? tamsulosin (FLOMAX) 0.4 MG CAPS capsule Take 0.4 mg by mouth daily.  ? ?No facility-administered encounter medications on file as of 10/11/2021.  ?Reviewed chart for medication changes ahead of medication coordination call. ? ?No OVs, Consults since last care coordination call/Pharmacist visit.  ? ?No medication changes indicated OR if recent visit, treatment plan here. ? ?BP Readings from Last 3 Encounters:  ?08/11/21 132/70  ?07/13/21 136/64  ?07/01/21 136/69  ?  ?Lab Results  ?Component Value Date  ? HGBA1C 6.1 (H) 08/11/2021  ?  ? ?Patient obtains medications through Adherence Packaging  90 Days  ? ?Last adherence delivery included:  ?Gabapentin 100 mg- 2 tablets tid (breakfast, lunch, evening meal) ?Allopurinol 100 mg- 2 tablets daily (breakfast) ?Flomax 0.4 mg- 1 tablet  daily (breakfast)                    ?Colchicine 0.6 mg- 1 tablet once weekly (breakfast) ?Diltiazem CD 240 mg- 1 tablet daily (breakfast) ?Rosuvastatin 10 mg- 1 tablet once on Monday (bedtime) ?Furosemide 40 mg- 1 tablet with breakfast and 1 tablet with evening meal ?Pantoprazole 40 mg- 1 tablet daily (bedtime) ?Synthroid 25 mcg- 1 tablet at breakfast Monday-friday ?  ? ?Patient declined (meds) last month:  ?None ? ?Patient is due for next adherence delivery on:  10-20-2021 ? ?Called patient and reviewed medications and coordinated delivery. ? ?This delivery to include: ?Flomax 0.4 mg- 1 tablet daily (breakfast)              ?Allopurinol 100 mg- 2 tablets daily (breakfast) ?Diltiazem CD 240 mg- 1 tablet daily (breakfast) ?Pantoprazole 40 mg- 1 tablet daily (bedtime) ?Colchicine 0.6 mg- 1 tablet once weekly (breakfast) ?Synthroid 25 mcg- 1 tablet at breakfast Monday-Friday ?Gabapentin 100 mg- 2 tablets at breakfast,  1 tablet at lunch, 1 tablet with evening meal ?Furosemide 40 mg- 1 tablet with breakfast and 1 tablet with evening meal ?Rosuvastatin 10 mg- 1 tablet daily (bedtime) ? ?No short/acute fill needed. ? ?Patient declined the following medications:  ?None ? ?Patient needs refills for: Sent refill request ?Gabapentin ? ?Confirmed delivery date of 10-20-2021 advised patient that pharmacy will contact them the morning of delivery. ? ?Care Gaps: ?Last AWV 05-20-2021 ? ?Star Rating Drugs: ?Rosuvastatin 20 mg- Last filled 07-16-2021 [facility] ?Ozempic 1 mg- Last filled 08-19-2021 [facility] mail order (Patient states he has one and a half boxes left) ? ?Malecca Hicks CMA ?Clinical Pharmacist Assistant ?[phone] ? ?

## 2021-10-12 ENCOUNTER — Ambulatory Visit (INDEPENDENT_AMBULATORY_CARE_PROVIDER_SITE_OTHER): Payer: Medicare Other | Admitting: Podiatry

## 2021-10-12 ENCOUNTER — Ambulatory Visit: Payer: Medicare Other | Admitting: Podiatry

## 2021-10-12 DIAGNOSIS — L97522 Non-pressure chronic ulcer of other part of left foot with fat layer exposed: Secondary | ICD-10-CM | POA: Diagnosis not present

## 2021-10-13 ENCOUNTER — Encounter: Payer: Self-pay | Admitting: Podiatry

## 2021-10-13 ENCOUNTER — Ambulatory Visit (INDEPENDENT_AMBULATORY_CARE_PROVIDER_SITE_OTHER): Payer: Medicare Other

## 2021-10-13 ENCOUNTER — Telehealth: Payer: Medicare Other

## 2021-10-13 DIAGNOSIS — N184 Chronic kidney disease, stage 4 (severe): Secondary | ICD-10-CM

## 2021-10-13 DIAGNOSIS — E039 Hypothyroidism, unspecified: Secondary | ICD-10-CM

## 2021-10-13 DIAGNOSIS — I739 Peripheral vascular disease, unspecified: Secondary | ICD-10-CM

## 2021-10-13 DIAGNOSIS — Z794 Long term (current) use of insulin: Secondary | ICD-10-CM

## 2021-10-13 NOTE — Patient Instructions (Signed)
Visit Information ? ?Thank you for taking time to visit with me today. Please don't hesitate to contact me if I can be of assistance to you before our next scheduled telephone appointment. ? ?Following are the goals we discussed today:  ?(Copy and paste patient goals from clinical care plan here) ? ?Our next appointment is by telephone on 11/19/21 at 11:15 AM  ? ?Please call the care guide team at [phone] if you need to cancel or reschedule your appointment.  ? ?If you are experiencing a Mental Health or [place] or need someone to talk to, please call [phone]-TALK (toll free, 24 hour hotline)  ? ?Patient verbalizes understanding of instructions and care plan provided today and agrees to view in [place]. Active MyChart status confirmed with patient.   ? ?Barb Merino, RN, BSN, CCM ?Care Management Coordinator ?[facility]  ?Direct Phone: [phone] ? ? ?

## 2021-10-13 NOTE — Progress Notes (Signed)
?  Subjective:  ?Patient ID: Micheal Walls, male    DOB: 1936/02/26,  MRN: [id number] ? ?Chief Complaint  ?Patient presents with  ? Foot Ulcer  ?   wound left ( left foot )  ? ? ? ?DOS: 09/14/2020 ?Procedure: Left hallux amputation ? ?86 y.o. male returns for post-op check.  Still using gentamicin cream feels he is doing okay ? ?Review of Systems: Negative except as noted in the HPI. Denies N/V/F/Ch. ? ? ?Objective:  ? ?There were no vitals filed for this visit. ? ?There is no height or weight on file to calculate BMI. ?Constitutional Well developed. ?Well nourished.  ?Vascular Foot warm and well perfused. ?Capillary refill normal to all digits.   ?Neurologic Normal speech. ?Oriented to person, place, and time. ?Epicritic sensation to light touch grossly present bilaterally.  ?Dermatologic Prior first toe amputation incision dehiscence has completely healed now.   lateral foot ulceration full-thickness with exposed subcutaneous tissues measure 0.2 x 0.2 x 0.2 cm, ?Excellent increase in epithelization and reduction in size today.  For toenails thickened elongated brown-yellow discoloration in the left foot  ?Orthopedic:  Minimal tenderness to palpation noted about the surgical site.  ? ? ? ? ? ?ABI 0.7 ? ?Assessment:  ? ?1. Ulcer of left foot with fat layer exposed ([place])   ? ? ? ? ? ? ? ? ?Plan:  ?Patient was evaluated and treated and all questions answered. ? ?Discussed the etiology and treatment options for the condition in detail with the patient. Educated patient on the topical and oral treatment options for mycotic nails. Recommended debridement of the nails today. Sharp and mechanical debridement performed of all painful and mycotic nails today. Nails debrided in length and thickness using a nail nipper to level of comfort. Discussed treatment options including appropriate shoe gear. Follow up as needed for painful nails. ? ? ?Ulcer left foot ?-Hallux amputation site remains healed ?-We discussed the etiology  and factors that are a part of the wound healing process.  We also discussed the risk of infection both soft tissue and osteomyelitis from open ulceration.  Discussed the risk of limb loss if this happens or worsens. ?-Debridement as below. ?-Dressed with Iodosorb, DSD. ?-Continue home dressing changes daily with and gentamicin cream after cleaning with wound cleanser spray ?-Wound is doing well and is nearly fully healed.  He can transition to an adhesive bandage instead of a full gauze wrap ? ? ?Procedure: Excisional Debridement of Wound ?Rationale: Removal of non-viable soft tissue from the wound to promote healing.  ?Anesthesia: none ?Post-Debridement Wound Measurements: 0.2 x 0.2 x 0.2 cm ?Type of Debridement: Sharp Excisional ?Tissue Removed: Non-viable soft tissue ?Depth of Debridement: subcutaneous tissue. ?Technique: Sharp excisional debridement to bleeding, viable wound base.  ?Dressing: Dry, sterile, compression dressing. ?Disposition: Patient tolerated procedure well.  ? ? ?   ? ?   ? ? ?Return in about 1 month (around 11/12/2021) for recheck plantar fasciitis.   ?

## 2021-10-13 NOTE — Chronic Care Management (AMB) (Signed)
?Chronic Care Management  ? ?CCM RN Visit Note ? ?10/13/2021 ?Name: Micheal Walls MRN: [id number] DOB: 05/27/1936 ? ?Subjective: ?Micheal Walls is a 86 y.o. year old male who is a primary care patient of [name], [place]. The care management team was consulted for assistance with disease management and care coordination needs.   ? ?Engaged with patient by telephone for follow up visit in response to provider referral for case management and/or care coordination services.  ? ?Consent to Services:  ?The patient was given information about Chronic Care Management services, agreed to services, and gave verbal consent prior to initiation of services.  Please see initial visit note for detailed documentation.  ? ?Patient agreed to services and verbal consent obtained.  ? ?Assessment: Review of patient past medical history, allergies, medications, health status, including review of consultants reports, laboratory and other test data, was performed as part of comprehensive evaluation and provision of chronic care management services.  ? ?SDOH (Social Determinants of Health) assessments and interventions performed:  Yes, no acute needs ? ?CCM Care Plan ? ?Allergies  ?Allergen Reactions  ? Vancomycin Rash  ? ? ?Outpatient Encounter Medications as of 10/13/2021  ?Medication Sig  ? allopurinol (ZYLOPRIM) 100 MG tablet Take 100 mg by mouth 2 (two) times daily.  ? aspirin EC 81 MG tablet Take 81 mg by mouth daily. Swallow whole.  ? colchicine 0.6 MG tablet Take 0.6 mg by mouth once a week.  ? Continuous Blood Gluc Sensor (FREESTYLE LIBRE 14 DAY SENSOR) MISC Use as directed to check blood sugars  ? diltiazem (CARDIZEM CD) 240 MG 24 hr capsule Take 1 capsule (240 mg total) by mouth daily.  ? dorzolamide (TRUSOPT) 2 % ophthalmic solution Place 1 drop into both eyes 2 (two) times daily.  ? ferrous sulfate 325 (65 FE) MG tablet Take 325 mg by mouth 2 (two) times daily with a meal.  ? furosemide (LASIX) 40 MG tablet Take 1 tablet  in the morning and one in the evening  ? gabapentin (NEURONTIN) 100 MG capsule TAKE TWO CAPSULES BY MOUTH EVERY MORNING and TAKE ONE CAPSULE BY MOUTH AT NOON and TAKE ONE CAPSULE BY MOUTH EVERY EVENING  ? gentamicin cream (GARAMYCIN) 0.1 % Apply 1 application. topically daily. To foot wound with dressing changes  ? glucose blood (FREESTYLE PRECISION NEO TEST) test strip Use as instructed  ? ketotifen (ZADITOR) 0.025 % ophthalmic solution Place 1 drop into both eyes 2 (two) times daily.  ? latanoprost (XALATAN) 0.005 % ophthalmic solution Place 1 drop into both eyes at bedtime.   ? Multiple Vitamins-Minerals (MULTIVITAMIN WITH MINERALS) tablet Take 1 tablet by mouth daily. Centrum men 50+  ? Multiple Vitamins-Minerals (PRESERVISION AREDS) CAPS Take 1 capsule by mouth 2 (two) times daily.  ? pantoprazole (PROTONIX) 40 MG tablet Take 1 tablet (40 mg total) by mouth daily.  ? Polyvinyl Alcohol-Povidone PF (REFRESH) 1.4-0.6 % SOLN Place 1 drop into both eyes 2 (two) times daily.  ? rosuvastatin (CRESTOR) 20 MG tablet Take 1 tablet (20 mg total) by mouth daily.  ? Semaglutide, [name], (OZEMPIC, [name],) 2 MG/1.5ML SOPN Inject 1 mg into the skin once a week.  ? senna-docusate (SENOKOT-S) 8.6-50 MG tablet Take 1 tablet by mouth 2 (two) times daily between meals as needed for mild constipation or moderate constipation. (Patient taking differently: Take 1 tablet by mouth 2 (two) times daily.)  ? SYNTHROID 25 MCG tablet Take 1 tablet by mouth Monday - Friday  ?  tamsulosin (FLOMAX) 0.4 MG CAPS capsule Take 0.4 mg by mouth daily.  ? ?No facility-administered encounter medications on file as of 10/13/2021.  ? ? ?Patient Active Problem List  ? Diagnosis Date Noted  ? Chronic diastolic (congestive) heart failure ([place]) 08/11/2021  ? Pseudophakia of both eyes 04/12/2021  ? Infection of amputation stump of left lower extremity ([place]) 10/21/2020  ? 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[place])   ? Left ankle swelling   ? Diabetic foot infection  ([place])   ? Cerebral thrombosis with cerebral infarction 07/20/2020  ? Thrombocytopenia ([place]) 07/19/2020  ? Cerebral ischemia 07/18/2020  ? Bilateral hearing loss 05/20/2020  ? Does use hearing aid 05/20/2020  ? Ulcer of left foot with fat layer exposed ([place]) 01/16/2020  ? Controlled type 2 diabetes mellitus with stable proliferative retinopathy of both eyes, with long-term current use of insulin ([place]) 01/06/2020  ? Intermediate stage nonexudative age-related macular degeneration of both eyes 01/06/2020  ? Left epiretinal membrane 01/06/2020  ? [place] of right macula 01/06/2020  ? Cellulitis 11/02/2019  ? Elevated TSH 07/10/2018  ? Nephropathy 02/28/2018  ? Disturbance of skin sensation 02/28/2018  ? PAD (peripheral artery disease) ([place]) 07/17/2017  ? 110.1 10/21/2013  ? Epiphora 04/24/2013  ? Laxity of eyelid 04/24/2013  ? Anemia 04/09/2013  ? History of peripheral vascular disease 04/09/2013  ? History of stroke 04/09/2013  ? S/P unilateral BKA (below knee amputation) ([place]) 03/17/2013  ? Diabetes mellitus type 2 in obese ([place]) 03/17/2013  ? Mixed hyperlipidemia 03/17/2013  ? Essential hypertension 03/17/2013  ? Gout 03/17/2013  ? CKD (chronic kidney disease), stage III ([place]) 03/17/2013  ? OSA on CPAP 03/17/2013  ? Anemia, iron deficiency 03/17/2013  ? Diverticulosis of colon 03/17/2013  ? RBBB 03/17/2013  ? Punctal stenosis, acquired 10/02/2012  ? ? ?Conditions to be addressed/monitored: DM II, PAD, CKD III, Hypothyroidism  ? ?Care Plan : RN Care Manager Plan of Care  ?Updates made by Lynne Logan, RN since 10/13/2021 12:00 AM  ?  ? ?Problem: Chronic disease education and Care Coordination needs for PAD, Cellulitis of left lower extremity, Neuropathy, Mixed hyperlipidemia, Type 2 diabetes mellitus, Stage 4 CKD   ?Priority: High  ?  ? ?Long-Range Goal: Establish Plan for Chronic disease education and Care Coordination needs for PAD, Cellulitis of left lower extremity, Neuropathy, Mixed hyperlipidemia, Type 2  diabetes mellitus, Stage 4 CKD   ?Start Date: 03/16/2021  ?Expected End Date: 03/16/2022  ?Recent Progress: On track  ?Priority: High  ?Note:   ?Current Barriers:  ?Knowledge Deficits related to plan of care for management of PAD, Cellulitis of left lower extremity, Neuropathy, Mixed hyperlipidemia, Type 2 diabetes mellitus, Stage 4 CKD  ?Chronic Disease Management support and education needs related to PAD, Cellulitis of left lower extremity, Neuropathy, Mixed hyperlipidemia, Type 2 diabetes mellitus, Stage 4 CKD  ? ?RNCM Clinical Goal(s):  ?Patient will verbalize understanding of plan for management of PAD, Cellulitis of left lower extremity, Neuropathy, Mixed hyperlipidemia, Type 2 diabetes mellitus ? demonstrate Improved health management independence  ? continue to work with RN Care Manager to address care management and care coordination needs related to  PAD, Cellulitis of left lower extremity, Neuropathy, Mixed hyperlipidemia, Type 2 diabetes mellitus through collaboration with RN Care manager, provider, and care team.  ? ?Interventions: ?1:1 collaboration with primary care provider regarding development and update of comprehensive plan of care as evidenced by provider attestation and co-signature ?Inter-disciplinary care team collaboration (see longitudinal plan of care) ?Evaluation of current  treatment plan related to  self management and patient's adherence to plan as established by provider ? ?[place] Interventions: (Goal on track:  Yes.) Long Term Goal  ?Evaluation of current treatment plan related to  Cellulitis of left lower extremity , self-management and patient's adherence to plan as established by provider. ?Reviewed and discussed recent follow up with Podiatry with the following Assessment/Plan noted:  ?Assessment   ? ?1. Ulcer of left foot with fat layer exposed ([place])   ? ?Plan:   ?Patient was evaluated and treated and all questions answered. ?Discussed the etiology and treatment options for the  condition in detail with the patient. Educated patient on the topical and oral treatment options for mycotic nails. Recommended debridement of the nails today. Sharp and mechanical debridement performed of a

## 2021-10-14 DIAGNOSIS — Z20822 Contact with and (suspected) exposure to covid-19: Secondary | ICD-10-CM | POA: Diagnosis not present

## 2021-10-18 ENCOUNTER — Encounter (INDEPENDENT_AMBULATORY_CARE_PROVIDER_SITE_OTHER): Payer: Medicare Other | Admitting: Ophthalmology

## 2021-10-18 ENCOUNTER — Ambulatory Visit (INDEPENDENT_AMBULATORY_CARE_PROVIDER_SITE_OTHER): Payer: Medicare Other | Admitting: Ophthalmology

## 2021-10-18 DIAGNOSIS — I70299 Other atherosclerosis of native arteries of extremities, unspecified extremity: Secondary | ICD-10-CM

## 2021-10-18 DIAGNOSIS — E113591 Type 2 diabetes mellitus with proliferative diabetic retinopathy without macular edema, right eye: Secondary | ICD-10-CM

## 2021-10-18 DIAGNOSIS — L97909 Non-pressure chronic ulcer of unspecified part of unspecified lower leg with unspecified severity: Secondary | ICD-10-CM | POA: Diagnosis not present

## 2021-10-18 DIAGNOSIS — E113592 Type 2 diabetes mellitus with proliferative diabetic retinopathy without macular edema, left eye: Secondary | ICD-10-CM | POA: Insufficient documentation

## 2021-10-18 DIAGNOSIS — E113593 Type 2 diabetes mellitus with proliferative diabetic retinopathy without macular edema, bilateral: Secondary | ICD-10-CM

## 2021-10-18 NOTE — Assessment & Plan Note (Signed)
Regional nonperfusion, will deliver PRP OD to prevent progression ?

## 2021-10-18 NOTE — Progress Notes (Signed)
? ? ?10/18/2021 ? ?  ? ?CHIEF COMPLAINT ?Patient presents for  ?Chief Complaint  ?Patient presents with  ? Diabetic Retinopathy without Macular Edema  ? ? ? ? ?HISTORY OF PRESENT ILLNESS: ?Micheal Walls is a 86 y.o. male who presents to the clinic today for:  ? ?HPI   ?6 mos FU OU OCT FP ?Pt denies FOL but sees floaters in both eyes, mostly right eye. ?Pt states vision has been stable. ?Pt is taking DORZOLAMIDE- 1x in both eyes daily ?LATANOPROST- 1x in both eyes daily at bedtime ?KETOTIFEN- 1x into both eyes daily. ? ?Last edited by Silvestre Moment on 10/18/2021  8:48 AM.  ?  ? ? ?Referring physician: ?[name], [place] ?[address] ?[address] ?[place],  [place] [id number] ? ?HISTORICAL INFORMATION:  ? ?Selected notes from the [facility] ?  ? ?Lab Results  ?Component Value Date  ? HGBA1C 6.1 (H) 08/11/2021  ?  ? ?CURRENT MEDICATIONS: ?Current Outpatient Medications (Ophthalmic Drugs)  ?Medication Sig  ? dorzolamide (TRUSOPT) 2 % ophthalmic solution Place 1 drop into both eyes 2 (two) times daily.  ? ketotifen (ZADITOR) 0.025 % ophthalmic solution Place 1 drop into both eyes 2 (two) times daily.  ? latanoprost (XALATAN) 0.005 % ophthalmic solution Place 1 drop into both eyes at bedtime.   ? Polyvinyl Alcohol-Povidone PF (REFRESH) 1.4-0.6 % SOLN Place 1 drop into both eyes 2 (two) times daily.  ? ?No current facility-administered medications for this visit. (Ophthalmic Drugs)  ? ?Current Outpatient Medications (Other)  ?Medication Sig  ? allopurinol (ZYLOPRIM) 100 MG tablet Take 100 mg by mouth 2 (two) times daily.  ? aspirin EC 81 MG tablet Take 81 mg by mouth daily. Swallow whole.  ? colchicine 0.6 MG tablet Take 0.6 mg by mouth once a week.  ? Continuous Blood Gluc Sensor (FREESTYLE LIBRE 14 DAY SENSOR) MISC Use as directed to check blood sugars  ? diltiazem (CARDIZEM CD) 240 MG 24 hr capsule Take 1 capsule (240 mg total) by mouth daily.  ? ferrous sulfate 325 (65 FE) MG tablet Take 325 mg by mouth 2 (two) times  daily with a meal.  ? furosemide (LASIX) 40 MG tablet Take 1 tablet in the morning and one in the evening  ? gabapentin (NEURONTIN) 100 MG capsule TAKE TWO CAPSULES BY MOUTH EVERY MORNING and TAKE ONE CAPSULE BY MOUTH AT NOON and TAKE ONE CAPSULE BY MOUTH EVERY EVENING  ? gentamicin cream (GARAMYCIN) 0.1 % Apply 1 application. topically daily. To foot wound with dressing changes  ? glucose blood (FREESTYLE PRECISION NEO TEST) test strip Use as instructed  ? Multiple Vitamins-Minerals (MULTIVITAMIN WITH MINERALS) tablet Take 1 tablet by mouth daily. Centrum men 50+  ? Multiple Vitamins-Minerals (PRESERVISION AREDS) CAPS Take 1 capsule by mouth 2 (two) times daily.  ? pantoprazole (PROTONIX) 40 MG tablet Take 1 tablet (40 mg total) by mouth daily.  ? rosuvastatin (CRESTOR) 20 MG tablet Take 1 tablet (20 mg total) by mouth daily.  ? Semaglutide, [name], (OZEMPIC, [name],) 2 MG/1.5ML SOPN Inject 1 mg into the skin once a week.  ? senna-docusate (SENOKOT-S) 8.6-50 MG tablet Take 1 tablet by mouth 2 (two) times daily between meals as needed for mild constipation or moderate constipation. (Patient taking differently: Take 1 tablet by mouth 2 (two) times daily.)  ? SYNTHROID 25 MCG tablet Take 1 tablet by mouth Monday - Friday  ? tamsulosin (FLOMAX) 0.4 MG CAPS capsule Take 0.4 mg by mouth daily.  ? ?  No current facility-administered medications for this visit. (Other)  ? ? ? ? ?REVIEW OF SYSTEMS: ?ROS   ?Negative for: Constitutional, Gastrointestinal, Neurological, Skin, Genitourinary, Musculoskeletal, HENT, Endocrine, Cardiovascular, Eyes, Respiratory, Psychiatric, Allergic/Imm, Heme/Lymph ?Last edited by Silvestre Moment on 10/18/2021  8:47 AM.  ?  ? ? ? ?ALLERGIES ?Allergies  ?Allergen Reactions  ? Vancomycin Rash  ? ? ?PAST MEDICAL HISTORY ?Past Medical History:  ?Diagnosis Date  ? Acute metabolic encephalopathy 09/17/4257  ? Amputated below knee ([place])   ? right  ? Anemia   ? Aspiration pneumonia ([place]) 11/02/2019  ? Diastolic  heart failure ([place])   ? Diverticulosis   ? DM (diabetes mellitus) ([place])   ? Gout   ? Hyperlipemia   ? OSA on CPAP   ? RBBB   ? Sepsis ([place]) 11/02/2019  ? Systemic hypertension   ? ?Past Surgical History:  ?Procedure Laterality Date  ? ABDOMINAL AORTOGRAM W/LOWER EXTREMITY Left 08/11/2020  ? Procedure: ABDOMINAL AORTOGRAM W/LOWER EXTREMITY;  Surgeon: Serafina Mitchell, MD;  Location: [facility];  Service: Cardiovascular;  Laterality: Left;  ? ABDOMINAL AORTOGRAM W/LOWER EXTREMITY N/A 02/26/2021  ? Procedure: ABDOMINAL AORTOGRAM W/LOWER EXTREMITY;  Surgeon: Cherre Robins, MD;  Location: [facility];  Service: Cardiovascular;  Laterality: N/A;  ? AMPUTATION Left 09/14/2020  ? Procedure: AMPUTATION LEFT GREAT TOE;  Surgeon: [name], DPM;  Location: [facility];  Service: Podiatry;  Laterality: Left;  ? LEG AMPUTATION BELOW KNEE    ? right  ? NM MYOCAR PERF WALL MOTION  09/18/2009  ? no ischemia  ? PERIPHERAL VASCULAR BALLOON ANGIOPLASTY Left 08/11/2020  ? Procedure: PERIPHERAL VASCULAR BALLOON ANGIOPLASTY;  Surgeon: Serafina Mitchell, MD;  Location: [facility];  Service: Cardiovascular;  Laterality: Left;  AT  ? PERIPHERAL VASCULAR INTERVENTION Left 02/26/2021  ? Procedure: PERIPHERAL VASCULAR INTERVENTION;  Surgeon: Cherre Robins, MD;  Location: [facility];  Service: Cardiovascular;  Laterality: Left;  ? ? ?FAMILY HISTORY ?Family History  ?Problem Relation Age of Onset  ? Diabetes Mother   ? Heart attack Father   ? Cancer Sister   ? ? ?SOCIAL HISTORY ?Social History  ? ?Tobacco Use  ? Smoking status: Former  ?  Types: Cigars  ?  Quit date: 06/12/2001  ?  Years since quitting: 20.3  ? Smokeless tobacco: Never  ?Vaping Use  ? Vaping Use: Former  ?Substance Use Topics  ? Alcohol use: No  ?  Alcohol/week: 0.0 standard drinks  ? Drug use: No  ? ?  ? ?  ? ?OPHTHALMIC EXAM: ? ?Base Eye Exam   ? ? Visual Acuity (ETDRS)   ? ?   Right Left  ? Dist cc 20/50 20/30 -2  ? Dist ph cc 20/30 -1   ? ?  Correction: Glasses  ? ?  ?  ? ? Tonometry (Tonopen, 8:55 AM)   ? ?   Right Left  ? Pressure 16 15  ? ?  ?  ? ? Pupils   ? ?   Pupils APD  ? Right PERRL None  ? Left PERRL None  ? ?  ?  ? ? Visual Fields   ? ?   Left Right  ?  Full Full  ? ?  ?  ? ? Extraocular Movement   ? ?   Right Left  ?  Full Full  ? ?  ?  ? ? Neuro/Psych   ? ? Oriented x3: Yes  ?  Mood/Affect: Normal  ? ?  ?  ? ? Dilation   ? ? Both eyes: 2.5% Phenylephrine, 1.0% Mydriacyl @ 8:55 AM  ? ?  ?  ? ?  ? ?Slit Lamp and Fundus Exam   ? ? External Exam   ? ?   Right Left  ? External Normal Normal  ? ?  ?  ? ? Slit Lamp Exam   ? ?   Right Left  ? Lids/Lashes Normal Normal  ? Conjunctiva/Sclera White and quiet White and quiet  ? Cornea Clear Clear  ? Anterior Chamber Deep and quiet Deep and quiet  ? Iris Round and reactive Round and reactive  ? Lens Centered posterior chamber intraocular lens Centered posterior chamber intraocular lens  ? Anterior Vitreous Normal Normal  ? ?  ?  ? ? Fundus Exam   ? ?   Right Left  ? Posterior Vitreous Normal Normal  ? Disc Normal Normal  ? C/D Ratio 0.3 0.3  ? Macula Normal, good focal laser, no active CSME Normal, good focal laser, no active CSME  ? Vessels PDR-quiet PDR-quiet  ? Periphery Good PRP 360, no NVE, yet large regional area temporally of nonperfusion. Good PRP 360, Fibrotic RAM  superotemporal arcade, with now surrounding subretinal hemorrhage.  Large regional nonperfusion in this area.  ? ?  ?  ? ?  ? ? ?IMAGING AND PROCEDURES  ?Imaging and Procedures for 10/18/21 ? ?OCT, Retina - OU - Both Eyes   ? ?   ?Right Eye ?Quality was borderline. Scan locations included subfoveal. Central Foveal Thickness: 211. Progression has been stable. Findings include abnormal foveal contour, central retinal atrophy, outer retinal atrophy.  ? ?Left Eye ?Quality was good. Scan locations included subfoveal. Central Foveal Thickness: 222. Progression has been stable. Findings include abnormal foveal contour, central retinal  atrophy, outer retinal atrophy.  ? ?Notes ?No active CSME OU ? ?  ? ?Color Fundus Photography Optos - OU - Both Eyes   ? ?   ?Right Eye ?Progression has been stable. Disc findings include normal observations. Macula :

## 2021-10-18 NOTE — Assessment & Plan Note (Signed)
OS will need completion of PRP with attention superotemporal tear large region of the brain with hemorrhage and a regional nonperfusion temporally ?

## 2021-10-21 DIAGNOSIS — Z20822 Contact with and (suspected) exposure to covid-19: Secondary | ICD-10-CM | POA: Diagnosis not present

## 2021-11-02 ENCOUNTER — Telehealth: Payer: Self-pay

## 2021-11-02 NOTE — Chronic Care Management (AMB) (Signed)
    Called DEJAN ANGERT, No answer, left message of appointment on 11-05-2021 at 12:00 via telephone visit with [name], Pharm D. Notified to have all medications, supplements, blood pressure and/or blood sugar logs available during appointment and to return call if need to reschedule.     [place] Pharmacist Assistant [phone]

## 2021-11-03 ENCOUNTER — Ambulatory Visit (INDEPENDENT_AMBULATORY_CARE_PROVIDER_SITE_OTHER): Payer: Medicare Other | Admitting: Ophthalmology

## 2021-11-03 ENCOUNTER — Encounter (INDEPENDENT_AMBULATORY_CARE_PROVIDER_SITE_OTHER): Payer: Self-pay | Admitting: Ophthalmology

## 2021-11-03 DIAGNOSIS — E113592 Type 2 diabetes mellitus with proliferative diabetic retinopathy without macular edema, left eye: Secondary | ICD-10-CM

## 2021-11-03 NOTE — Assessment & Plan Note (Signed)
OS with large region of untreated retina as well as around an area of a random superotemporally.  Local PRP delivered temporally as well as around the RAM superotemporally today atraumatically

## 2021-11-03 NOTE — Progress Notes (Signed)
11/03/2021     CHIEF COMPLAINT Patient presents for  Chief Complaint  Patient presents with   Diabetic Retinopathy without Macular Edema      HISTORY OF PRESENT ILLNESS: Micheal Walls is a 86 y.o. male who presents to the clinic today for:   HPI   2 weeks for DILATE, OS, PRP, TEMPORALLY AND POSTERIORLY. Pt stated no changes in vision. Pt denies floaters and FOL.  Last edited by Angeline Slim on 11/03/2021  9:43 AM.      Referring physician: [name], [name] [address] [place],  [place] [address]  HISTORICAL INFORMATION:   Selected notes from the MEDICAL RECORD NUMBER    Lab Results  Component Value Date   HGBA1C 6.1 (H) 08/11/2021     CURRENT MEDICATIONS: Current Outpatient Medications (Ophthalmic Drugs)  Medication Sig   dorzolamide (TRUSOPT) 2 % ophthalmic solution Place 1 drop into both eyes 2 (two) times daily.   ketotifen (ZADITOR) 0.025 % ophthalmic solution Place 1 drop into both eyes 2 (two) times daily.   latanoprost (XALATAN) 0.005 % ophthalmic solution Place 1 drop into both eyes at bedtime.    Polyvinyl Alcohol-Povidone PF (REFRESH) 1.4-0.6 % SOLN Place 1 drop into both eyes 2 (two) times daily.   No current facility-administered medications for this visit. (Ophthalmic Drugs)   Current Outpatient Medications (Other)  Medication Sig   allopurinol (ZYLOPRIM) 100 MG tablet Take 100 mg by mouth 2 (two) times daily.   aspirin EC 81 MG tablet Take 81 mg by mouth daily. Swallow whole.   colchicine 0.6 MG tablet Take 0.6 mg by mouth once a week.   Continuous Blood Gluc Sensor (FREESTYLE LIBRE 14 DAY SENSOR) MISC Use as directed to check blood sugars   diltiazem (CARDIZEM CD) 240 MG 24 hr capsule Take 1 capsule (240 mg total) by mouth daily.   ferrous sulfate 325 (65 FE) MG tablet Take 325 mg by mouth 2 (two) times daily with a meal.   furosemide (LASIX) 40 MG tablet Take 1 tablet in the morning and one in the evening   gabapentin (NEURONTIN)  100 MG capsule TAKE TWO CAPSULES BY MOUTH EVERY MORNING and TAKE ONE CAPSULE BY MOUTH AT NOON and TAKE ONE CAPSULE BY MOUTH EVERY EVENING   gentamicin cream (GARAMYCIN) 0.1 % Apply 1 application. topically daily. To foot wound with dressing changes   glucose blood (FREESTYLE PRECISION NEO TEST) test strip Use as instructed   Multiple Vitamins-Minerals (MULTIVITAMIN WITH MINERALS) tablet Take 1 tablet by mouth daily. Centrum men 50+   Multiple Vitamins-Minerals (PRESERVISION AREDS) CAPS Take 1 capsule by mouth 2 (two) times daily.   pantoprazole (PROTONIX) 40 MG tablet Take 1 tablet (40 mg total) by mouth daily.   rosuvastatin (CRESTOR) 20 MG tablet Take 1 tablet (20 mg total) by mouth daily.   Semaglutide, [name], (OZEMPIC, [name],) 2 MG/1.5ML SOPN Inject 1 mg into the skin once a week.   senna-docusate (SENOKOT-S) 8.6-50 MG tablet Take 1 tablet by mouth 2 (two) times daily between meals as needed for mild constipation or moderate constipation. (Patient taking differently: Take 1 tablet by mouth 2 (two) times daily.)   SYNTHROID 25 MCG tablet Take 1 tablet by mouth Monday - Friday   tamsulosin (FLOMAX) 0.4 MG CAPS capsule Take 0.4 mg by mouth daily.   No current facility-administered medications for this visit. (Other)      REVIEW OF SYSTEMS: ROS   Negative for: Constitutional, Gastrointestinal, Neurological, Skin,  Genitourinary, Musculoskeletal, HENT, Endocrine, Cardiovascular, Eyes, Respiratory, Psychiatric, Allergic/Imm, Heme/Lymph Last edited by Silvestre Moment on 11/03/2021  9:43 AM.       ALLERGIES Allergies  Allergen Reactions   Vancomycin Rash    PAST MEDICAL HISTORY Past Medical History:  Diagnosis Date   Acute metabolic encephalopathy 0/07/7739   Amputated below knee ([facility])    right   Anemia    Aspiration pneumonia ([facility]) 2/87/8676   Diastolic heart failure (HCC)    Diverticulosis    DM (diabetes mellitus) ([facility])    Gout    Hyperlipemia    OSA on CPAP    RBBB     Sepsis ([facility]) 11/02/2019   Systemic hypertension    Past Surgical History:  Procedure Laterality Date   ABDOMINAL AORTOGRAM W/LOWER EXTREMITY Left 08/11/2020   Procedure: ABDOMINAL AORTOGRAM W/LOWER EXTREMITY;  Surgeon: Serafina Mitchell, MD;  Location: [facility];  Service: Cardiovascular;  Laterality: Left;   ABDOMINAL AORTOGRAM W/LOWER EXTREMITY N/A 02/26/2021   Procedure: ABDOMINAL AORTOGRAM W/LOWER EXTREMITY;  Surgeon: Cherre Robins, MD;  Location: [facility];  Service: Cardiovascular;  Laterality: N/A;   AMPUTATION Left 09/14/2020   Procedure: AMPUTATION LEFT GREAT TOE;  Surgeon: [name], DPM;  Location: [facility];  Service: Podiatry;  Laterality: Left;   LEG AMPUTATION BELOW KNEE     right   NM MYOCAR PERF WALL MOTION  09/18/2009   no ischemia   PERIPHERAL VASCULAR BALLOON ANGIOPLASTY Left 08/11/2020   Procedure: PERIPHERAL VASCULAR BALLOON ANGIOPLASTY;  Surgeon: Serafina Mitchell, MD;  Location: [facility];  Service: Cardiovascular;  Laterality: Left;  AT   PERIPHERAL VASCULAR INTERVENTION Left 02/26/2021   Procedure: PERIPHERAL VASCULAR INTERVENTION;  Surgeon: Cherre Robins, MD;  Location: [facility];  Service: Cardiovascular;  Laterality: Left;    FAMILY HISTORY Family History  Problem Relation Age of Onset   Diabetes Mother    Heart attack Father    Cancer Sister     SOCIAL HISTORY Social History   Tobacco Use   Smoking status: Former    Types: Cigars    Quit date: 06/12/2001    Years since quitting: 20.4   Smokeless tobacco: Never  Vaping Use   Vaping Use: Former  Substance Use Topics   Alcohol use: No    Alcohol/week: 0.0 standard drinks   Drug use: No         OPHTHALMIC EXAM:  Base Eye Exam     Visual Acuity (ETDRS)       Right Left   Dist cc 20/50 -2 20/30 -2   Dist ph cc 20/40 -2 20/25 -1    Correction: Glasses         Tonometry (Tonopen, 9:51 AM)       Right Left   Pressure 14 17         Pupils        Pupils APD   Right PERRL None   Left PERRL None         Visual Fields       Left Right    Full Full         Neuro/Psych     Oriented x3: Yes   Mood/Affect: Normal         Dilation     Left eye: 1.0% Mydriacyl, 2.5% Phenylephrine @ 9:51 AM           Slit Lamp and Fundus Exam     External Exam  Right Left   External Normal Normal         Slit Lamp Exam       Right Left   Lids/Lashes Normal Normal   Conjunctiva/Sclera White and quiet White and quiet   Cornea Clear Clear   Anterior Chamber Deep and quiet Deep and quiet   Iris Round and reactive Round and reactive   Lens Centered posterior chamber intraocular lens Centered posterior chamber intraocular lens   Anterior Vitreous Normal Normal         Fundus Exam       Right Left   Posterior Vitreous Normal Normal   Disc Normal Normal   C/D Ratio 0.3 0.3   Macula Normal, good focal laser, no active CSME Normal, good focal laser, no active CSME   Vessels PDR-quiet PDR-quiet   Periphery Good PRP 360, no NVE, yet large regional area temporally of nonperfusion. Good PRP 360, Fibrotic RAM  superotemporal arcade, with now surrounding subretinal hemorrhage.  Large regional nonperfusion in this area.            IMAGING AND PROCEDURES  Imaging and Procedures for 11/03/21  Panretinal Photocoagulation - OS - Left Eye       Anesthesia Topical anesthesia was used. Anesthetic medications included Proparacaine 0.5%.   Laser Information The type of laser was diode. Color was yellow. The duration in seconds was 0.03. The spot size was 390 microns. Laser power was 260. Total spots was 341.   Post-op The patient tolerated the procedure well. There were no complications. The patient received written and verbal post procedure care education.   Notes Temporal retina treated             ASSESSMENT/PLAN:  Proliferative diabetic retinopathy of left eye (HCC) OS with large region of untreated  retina as well as around an area of a random superotemporally.  Local PRP delivered temporally as well as around the RAM superotemporally today atraumatically      ICD-10-CM   1. 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  PRP delivered temporally OS today and looks great.  2.  OU follow-up in 6 weeks and dilate OU  3.  Ophthalmic Meds Ordered this visit:  No orders of the defined types were placed in this encounter.      Return in about 6 weeks (around 12/15/2021) for DILATE OU, COLOR FP, OCT.  There are no Patient Instructions on file for this visit.   Explained the diagnoses, plan, and follow up with the patient and they expressed understanding.  Patient expressed understanding of the importance of proper follow up care.   Clent Demark Tayt Moyers M.D. Diseases & Surgery of the Retina and Vitreous [facility]ina [facility] [place] 11/03/21     Abbreviations: M myopia (nearsighted); A astigmatism; H hyperopia (farsighted); P presbyopia; Mrx spectacle prescription;  CTL contact lenses; OD right eye; OS left eye; OU both eyes  XT exotropia; ET esotropia; PEK punctate epithelial keratitis; PEE punctate epithelial erosions; DES dry eye syndrome; MGD meibomian gland dysfunction; ATs artificial tears; PFAT's preservative free artificial tears; [place] nuclear sclerotic cataract; PSC posterior subcapsular cataract; ERM epi-retinal membrane; PVD posterior vitreous detachment; RD retinal detachment; DM diabetes mellitus; DR diabetic retinopathy; NPDR non-proliferative diabetic retinopathy; PDR proliferative diabetic retinopathy; CSME clinically significant macular edema; DME diabetic macular edema; dbh dot blot hemorrhages; CWS cotton wool spot;  POAG primary open angle glaucoma; C/D cup-to-disc ratio; HVF humphrey visual field; GVF goldmann visual field; OCT optical coherence  tomography; IOP intraocular pressure; BRVO Branch retinal vein occlusion; CRVO central retinal vein occlusion; CRAO central retinal artery occlusion; BRAO branch retinal artery occlusion; RT retinal tear; SB scleral buckle; PPV pars plana vitrectomy; VH Vitreous hemorrhage; PRP panretinal laser photocoagulation; IVK intravitreal kenalog; VMT vitreomacular traction; MH Macular hole;  NVD neovascularization of the disc; NVE neovascularization elsewhere; AREDS age related eye disease study; ARMD age related macular degeneration; POAG primary open angle glaucoma; EBMD epithelial/anterior basement membrane dystrophy; ACIOL anterior chamber intraocular lens; IOL intraocular lens; PCIOL posterior chamber intraocular lens; Phaco/IOL phacoemulsification with intraocular lens placement; [place] photorefractive keratectomy; LASIK laser assisted in situ keratomileusis; HTN hypertension; DM diabetes mellitus; COPD chronic obstructive pulmonary disease

## 2021-11-05 ENCOUNTER — Ambulatory Visit: Payer: Medicare Other

## 2021-11-05 DIAGNOSIS — E782 Mixed hyperlipidemia: Secondary | ICD-10-CM

## 2021-11-05 DIAGNOSIS — Z794 Long term (current) use of insulin: Secondary | ICD-10-CM

## 2021-11-05 NOTE — Progress Notes (Signed)
Chronic Care Management Pharmacy Note  11/09/2021 Name:  Micheal Walls MRN:  [id number] DOB:  1935-10-30  Summary: Patient reports that he is doing well.   Recommendations/Changes made from today's visit: Recommend patient possibly be started on SGLT-2 and GLP-1 for CKD and CHF benefit.   Plan: Patient reports that sometimes he is cold, but for now he does not want to reschedule his appointment with PCP team he would like to keep July visit.    Subjective: Micheal Walls is an 86 y.o. year old male who is a primary patient of [name], [place].  The CCM team was consulted for assistance with disease management and care coordination needs.    Engaged with patient by telephone for follow up visit in response to provider referral for pharmacy case management and/or care coordination services.   Consent to Services:  The patient was given information about Chronic Care Management services, agreed to services, and gave verbal consent prior to initiation of services.  Please see initial visit note for detailed documentation.   Patient Care Team: [name], [name] as PCP - General (General Practice) [name], [name], MD as PCP - Cardiology (Cardiology) Rex Kras, Claudette Stapler, RN as [place] Management Mayford Knife, [facility] (Pharmacist)  Recent office visits: 08/11/2021 PCP OV  Recent consult visits: 11/03/2021 Retina OV 10/18/2021 Retina OV 10/12/2021 Podiatry OV 09/14/2021 Podiatry OV 08/12/2021 [facility] visits: None in previous 6 months   Objective:  Lab Results  Component Value Date   CREATININE 1.75 (H) 08/11/2021   BUN 51 (H) 08/11/2021   EGFR 38 (L) 08/11/2021   GFRNONAA 35 (L) 10/24/2020   GFRAA 42 (L) 05/20/2020   NA 139 08/11/2021   K 4.1 08/11/2021   CALCIUM 10.3 (H) 08/11/2021   CO2 23 08/11/2021   GLUCOSE 93 08/11/2021    Lab Results  Component Value Date/Time   HGBA1C 6.1 (H) 08/11/2021 11:37 AM   HGBA1C 6.5 (H)  05/11/2021 11:52 AM   MICROALBUR 30 09/28/2020 03:58 PM   MICROALBUR 80 05/15/2019 10:28 AM    Last diabetic Eye exam:  Lab Results  Component Value Date/Time   HMDIABEYEEXA Retinopathy (A) 06/24/2021 12:00 AM    Last diabetic Foot exam: No results found for: HMDIABFOOTEX   Lab Results  Component Value Date   CHOL 101 08/11/2021   HDL 30 (L) 08/11/2021   LDLCALC 52 08/11/2021   TRIG 100 08/11/2021   CHOLHDL 3.4 08/11/2021       Latest Ref Rng & Units 05/11/2021   11:52 AM 10/29/2020   11:56 AM 10/21/2020    6:47 PM  Hepatic Function  Total Protein 6.0 - 8.5 g/dL 6.8   7.2   7.6    Albumin 3.6 - 4.6 g/dL 3.7   3.3   3.3    AST 0 - 40 IU/L _0 ALT 0 - 44 IU/L _1 Alk Phosphatase 44 - 121 IU/L 87   82   68    Total Bilirubin 0.0 - 1.2 mg/dL 0.3   0.3   0.4      Lab Results  Component Value Date/Time   TSH 4.400 08/11/2021 11:37 AM   TSH 2.550 06/22/2021 11:57 AM   FREET4 0.95 03/30/2021 10:13 AM       Latest Ref Rng & Units 02/26/2021    6:25 AM 10/29/2020  11:56 AM 10/24/2020    3:25 AM  CBC  WBC 3.4 - 10.8 x10E3/uL  6.4   8.8    Hemoglobin 13.0 - 17.0 g/dL 10.5   9.6   8.1    Hematocrit 39.0 - 52.0 % 31.0   29.6   26.1    Platelets 150 - 450 x10E3/uL  240   206      No results found for: VD25OH  Clinical ASCVD: Yes  The ASCVD Risk score (Arnett DK, et al., 2019) failed to calculate for the following reasons:   The 2019 ASCVD risk score is only valid for ages 67 to 25   The patient has a prior MI or stroke diagnosis       05/20/2021    9:19 AM 05/20/2020    9:18 AM 09/24/2019    8:45 AM  Depression screen PHQ 2/9  Decreased Interest 0 0 0  Down, Depressed, Hopeless 0 0 0  PHQ - 2 Score 0 0 0       Social History   Tobacco Use  Smoking Status Former   Types: Cigars   Quit date: 06/12/2001   Years since quitting: 20.4  Smokeless Tobacco Never   BP Readings from Last 3 Encounters:  08/11/21 132/70  07/13/21 136/64   07/01/21 136/69   Pulse Readings from Last 3 Encounters:  08/11/21 75  07/13/21 69  07/01/21 75   Wt Readings from Last 3 Encounters:  07/01/21 248 lb (112.5 kg)  04/23/21 248 lb (112.5 kg)  03/18/21 250 lb (113.4 kg)   BMI Readings from Last 3 Encounters:  07/01/21 32.72 kg/m  05/11/21 32.72 kg/m  04/23/21 32.72 kg/m    Assessment/Interventions: Review of patient past medical history, allergies, medications, health status, including review of consultants reports, laboratory and other test data, was performed as part of comprehensive evaluation and provision of chronic care management services.   SDOH:  (Social Determinants of Health) assessments and interventions performed: No  SDOH Screenings   Alcohol Screen: Not on file  Depression (PHQ2-9): Low Risk    PHQ-2 Score: 0  Financial Resource Strain: Low Risk    Difficulty of Paying Living Expenses: Not hard at all  Food Insecurity: No Food Insecurity   Worried About Charity fundraiser in the Last Year: Never true   Ran Out of Food in the Last Year: Never true  Housing: Not on file  Physical Activity: Inactive   Days of Exercise per Week: 0 days   Minutes of Exercise per Session: 0 min  Social Connections: Not on file  Stress: No Stress Concern Present   Feeling of Stress : Not at all  Tobacco Use: Medium Risk   Smoking Tobacco Use: Former   Smokeless Tobacco Use: Never   Passive Exposure: Not on file  Transportation Needs: No Transportation Needs   Lack of Transportation (Medical): No   Lack of Transportation (Non-Medical): No    CCM Care Plan  Allergies  Allergen Reactions   Vancomycin Rash    Medications Reviewed Today     Reviewed by Mayford Knife, RPH (Pharmacist) on 11/05/21 at 1145  Med List Status: <None>   Medication Order Taking? Sig Documenting Provider Last Dose Status Informant  allopurinol (ZYLOPRIM) 100 MG tablet [id number] No Take 100 mg by mouth 2 (two) times daily. [provider] Taking Active Self           Med Note Fransico Him Oct 22, 2020  9:51 AM)    aspirin EC 81 MG tablet [id number] No Take 81 mg by mouth daily. Swallow whole. [provider] Taking Active Self  colchicine 0.6 MG tablet [id number] No Take 0.6 mg by mouth once a week. [provider] Taking Active Self  Continuous Blood Gluc Sensor (FREESTYLE LIBRE 14 DAY SENSOR) [place] [id number]  Use as directed to check blood sugars [name], [name]  Active   diltiazem (CARDIZEM CD) 240 MG 24 hr capsule [id number] No Take 1 capsule (240 mg total) by mouth daily. [name], [name], MD Taking Expired 07/13/21 2359 Self  dorzolamide (TRUSOPT) 2 % ophthalmic solution [id number] No Place 1 drop into both eyes 2 (two) times daily. [provider] Taking Active Self  ferrous sulfate 325 (65 FE) MG tablet [id number] No Take 325 mg by mouth 2 (two) times daily with a meal. [provider] Taking Active Self           Med Note Gentry Roch   Tue Feb 23, 2021 10:34 AM)    furosemide (LASIX) 40 MG tablet [id number]  Take 1 tablet in the morning and one in the evening [name], [name]  Active   gabapentin (NEURONTIN) 100 MG capsule [id number]  TAKE TWO CAPSULES BY MOUTH EVERY MORNING and TAKE ONE CAPSULE BY MOUTH AT NOON and TAKE ONE CAPSULE BY MOUTH EVERY Wenda Low, [name]  Active   gentamicin cream (GARAMYCIN) 0.1 % [id number]  Apply 1 application. topically daily. To foot wound with dressing changes [name], DPM  Active   glucose blood (FREESTYLE PRECISION NEO TEST) test strip [id number] No Use as instructed [name], [name] Taking Active Self  ketotifen (ZADITOR) 0.025 % ophthalmic solution [id number] No Place 1 drop into both eyes 2 (two) times daily. [provider] Taking Active Self  latanoprost (XALATAN) 0.005 % ophthalmic solution [id number] No Place 1 drop into both eyes at bedtime.  [provider] Taking Active Self            Med Note Fransico Him Oct 22, 2020 10:00 AM)    Multiple Vitamins-Minerals (MULTIVITAMIN WITH MINERALS) tablet [id number] No Take 1 tablet by mouth daily. Centrum men 50+ [provider] Taking Active Self  Multiple Vitamins-Minerals (PRESERVISION AREDS) CAPS [id number] No Take 1 capsule by mouth 2 (two) times daily. [provider] Taking Active Self  pantoprazole (PROTONIX) 40 MG tablet [id number]  Take 1 tablet (40 mg total) by mouth daily. [name], [name]  Active   Polyvinyl Alcohol-Povidone PF (REFRESH) 1.4-0.6 % SOLN [id number] No Place 1 drop into both eyes 2 (two) times daily. [provider] Taking Active Self  rosuvastatin (CRESTOR) 20 MG tablet [id number] No Take 1 tablet (20 mg total) by mouth daily. [name], [name], MD Taking Active   Semaglutide, [name], (OZEMPIC, [name],) 2 MG/1.5ML SOPN [id number] No Inject 1 mg into the skin once a week. [name], [name] Taking Active Self  senna-docusate (SENOKOT-S) 8.6-50 MG tablet [id number] No Take 1 tablet by mouth 2 (two) times daily between meals as needed for mild constipation or moderate constipation.  Patient taking differently: Take 1 tablet by mouth 2 (two) times daily.   Mercy Riding, MD Taking Active   SYNTHROID 25 MCG tablet [id number]  Take 1 tablet by mouth Monday - Friday [name], [name]  Active   tamsulosin [facility]) 0.4 MG CAPS capsule [id number] No Take 0.4 mg by mouth daily. [provider] Taking Active Self  Patient Active Problem List   Diagnosis Date Noted   Proliferative diabetic retinopathy of left eye ([place]) 10/18/2021   Chronic diastolic (congestive) heart failure ([place]) 08/11/2021   Pseudophakia of both eyes 04/12/2021   Infection of amputation stump of left lower extremity ([place]) 10/21/2020   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[place])    Left ankle swelling    Diabetic foot infection ([place])    Cerebral thrombosis with cerebral infarction 07/20/2020    Thrombocytopenia ([place]) 07/19/2020   Cerebral ischemia 07/18/2020   Bilateral hearing loss 05/20/2020   Does use hearing aid 05/20/2020   Ulcer of left foot with fat layer exposed ([place]) 01/16/2020   Proliferative diabetic retinopathy of right eye ([place]) 01/06/2020   Intermediate stage nonexudative age-related macular degeneration of both eyes 01/06/2020   Left epiretinal membrane 01/06/2020   [place] of right macula 01/06/2020   Cellulitis 11/02/2019   Elevated TSH 07/10/2018   Nephropathy 02/28/2018   Disturbance of skin sensation 02/28/2018   PAD (peripheral artery disease) ([place]) 07/17/2017   110.1 10/21/2013   Epiphora 04/24/2013   Laxity of eyelid 04/24/2013   Anemia 04/09/2013   History of peripheral vascular disease 04/09/2013   History of stroke 04/09/2013   S/P unilateral BKA (below knee amputation) ([place]) 03/17/2013   Diabetes mellitus type 2 in obese ([place]) 03/17/2013   Mixed hyperlipidemia 03/17/2013   Essential hypertension 03/17/2013   Gout 03/17/2013   CKD (chronic kidney disease), stage III ([place]) 03/17/2013   OSA on CPAP 03/17/2013   Anemia, iron deficiency 03/17/2013   Diverticulosis of colon 03/17/2013   RBBB 03/17/2013   Punctal stenosis, acquired 10/02/2012    Immunization History  Administered Date(s) Administered   Fluad Quad(high Dose 65+) 05/20/2020, 04/09/2021   Influenza, High Dose Seasonal PF 03/21/2019   PFIZER(Purple Top)SARS-COV-2 Vaccination 08/31/2019, 09/25/2019, 04/03/2020, 06/22/2021   Pfizer Covid-19 Vaccine Bivalent Booster 75yr & up 06/22/2021   Pneumococcal Conjugate-13 02/23/2016   Pneumococcal Polysaccharide-23 05/15/2019   Tdap 02/04/2013   Zoster Recombinat (Shingrix) 06/22/2021, 09/28/2021    Conditions to be addressed/monitored:  Hyperlipidemia and Diabetes  Care Plan : [place] Updates made by PMayford Knife RGilt Edgesince 11/09/2021 12:00 AM     Problem: DM II, Hyperlipidemia   Priority: High     Long-Range  Goal: Disease Management   Recent Progress: On track  Priority: High  Note:   Current Barriers:  Unable to independently monitor therapeutic efficacy  Pharmacist Clinical Goal(s):  Patient will achieve adherence to monitoring guidelines and medication adherence to achieve therapeutic efficacy through collaboration with PharmD and provider.   Interventions: 1:1 collaboration with MMinette Brine [name] regarding development and update of comprehensive plan of care as evidenced by provider attestation and co-signature Inter-disciplinary care team collaboration (see longitudinal plan of care) Comprehensive medication review performed; medication list updated in electronic medical record  Hyperlipidemia: (LDL goal < 55) -Not ideally controlled -Current treatment: Rosuvastatin 20 mg tablet once per day Appropriate, Effective, Safe, Accessible -Current exercise habits: small amount of exercise but not too much  -Educated on Cholesterol goals;  Benefits of statin for ASCVD risk reduction; -Recommended to continue current medication  Diabetes (A1c goal <7%) -Controlled -Current medications: Ozempic 1 mg once a week on Wednesday Appropriate, Effective, Safe, Accessible -Current home glucose readings: mostly twice per day  fasting glucose: 106, 74, 79, 109 , 101, 92, 96, 121, 132, 91, 88 post prandial glucose: 90, 96, 141, 129, 91, 144, 130, 111,136  -Denies hypoglycemic/hyperglycemic symptoms -Current meal patterns:  will discuss further during next office visit  -Current exercise: will discuss further next visit -Educated on Complications of diabetes including kidney damage, retinal damage, and cardiovascular disease; Benefits of routine self-monitoring of blood sugar; -Counseled to check feet daily and get yearly eye exams -Recommended to continue current medication  Patient Goals/Self-Care Activities Patient will:  - take medications as prescribed as evidenced by patient report and  record review  Follow Up Plan: The patient has been provided with contact information for the care management team and has been advised to call with any health related questions or concerns.       Medication Assistance: None required.  Patient affirms current coverage meets needs.  Compliance/Adherence/Medication fill history: Care Gaps: No care gaps found at this time   Star-Rating Drugs: Rosuvastatin 20 mg tablet  Ozempic 1 mg dose   Patient's preferred pharmacy is:  [id number] [place] [address] [address]Phone: [phone]:: [phone] [facility] [place]Phone: [phone]: [phone] [facility] [place] [place]Phone: [phone]: [phone] Uses pill box? Yes Pt endorses 95% compliance  We discussed: Benefits of medication synchronization, packaging and delivery as well as enhanced pharmacist oversight with [facility]. Patient decided to: Continue current medication management strategy  Care Plan and Follow Up Patient Decision:  Patient agrees to Care Plan and Follow-up.  Plan: The patient has been provided with contact information for the care management team and has been advised to call with any health related questions or concerns.   VOrlando Penner CPP, PharmD Clinical Pharmacist Practitioner [facility] [phone]

## 2021-11-09 NOTE — Patient Instructions (Signed)
Visit Information It was great speaking with you today!  Please let me know if you have any questions about our visit.   Goals Addressed             This Visit's Progress    Manage My Medicine       Timeframe:  Long-Range Goal Priority:  High Start Date:                             Expected End Date:                       Follow Up Date 04/2022   In Progress:  - keep a list of all the medicines I take; vitamins and herbals too - use a pillbox to sort medicine - use an alarm clock or phone to remind me to take my medicine    Why is this important?   These steps will help you keep on track with your medicines.        Patient Care Plan: CCM Pharmacy Care Plan     Problem Identified: DM II, Hyperlipidemia   Priority: High     Long-Range Goal: Disease Management   Recent Progress: On track  Priority: High  Note:   Current Barriers:  Unable to independently monitor therapeutic efficacy  Pharmacist Clinical Goal(s):  Patient will achieve adherence to monitoring guidelines and medication adherence to achieve therapeutic efficacy through collaboration with PharmD and provider.   Interventions: 1:1 collaboration with [name], [name] regarding development and update of comprehensive plan of care as evidenced by provider attestation and co-signature Inter-disciplinary care team collaboration (see longitudinal plan of care) Comprehensive medication review performed; medication list updated in electronic medical record  Hyperlipidemia: (LDL goal < 55) -Not ideally controlled -Current treatment: Rosuvastatin 20 mg tablet once per day Appropriate, Effective, Safe, Accessible -Current exercise habits: small amount of exercise but not too much  -Educated on Cholesterol goals;  Benefits of statin for ASCVD risk reduction; -Recommended to continue current medication  Diabetes (A1c goal <7%) -Controlled -Current medications: Ozempic 1 mg once a week on Wednesday  Appropriate, Effective, Safe, Accessible -Current home glucose readings: mostly twice per day  fasting glucose: 106, 74, 79, 109 , 101, 92, 96, 121, 132, 91, 88 post prandial glucose: 90, 96, 141, 129, 91, 144, 130, 111,136  -Denies hypoglycemic/hyperglycemic symptoms -Current meal patterns: will discuss further during next office visit  -Current exercise: will discuss further next visit -Educated on Complications of diabetes including kidney damage, retinal damage, and cardiovascular disease; Benefits of routine self-monitoring of blood sugar; -Counseled to check feet daily and get yearly eye exams -Recommended to continue current medication  Patient Goals/Self-Care Activities Patient will:  - take medications as prescribed as evidenced by patient report and record review  Follow Up Plan: The patient has been provided with contact information for the care management team and has been advised to call with any health related questions or concerns.      Patient agreed to services and verbal consent obtained.   The patient verbalized understanding of instructions, educational materials, and care plan provided today and agreed to receive a mailed copy of patient instructions, educational materials, and care plan.   [name], PharmD Clinical Pharmacist [facility] [phone]

## 2021-11-10 DIAGNOSIS — E1122 Type 2 diabetes mellitus with diabetic chronic kidney disease: Secondary | ICD-10-CM | POA: Diagnosis not present

## 2021-11-10 DIAGNOSIS — E039 Hypothyroidism, unspecified: Secondary | ICD-10-CM

## 2021-11-10 DIAGNOSIS — N184 Chronic kidney disease, stage 4 (severe): Secondary | ICD-10-CM

## 2021-11-10 DIAGNOSIS — E785 Hyperlipidemia, unspecified: Secondary | ICD-10-CM | POA: Diagnosis not present

## 2021-11-10 DIAGNOSIS — E782 Mixed hyperlipidemia: Secondary | ICD-10-CM

## 2021-11-10 DIAGNOSIS — Z794 Long term (current) use of insulin: Secondary | ICD-10-CM

## 2021-11-10 DIAGNOSIS — Z7985 Long-term (current) use of injectable non-insulin antidiabetic drugs: Secondary | ICD-10-CM

## 2021-11-10 DIAGNOSIS — E1159 Type 2 diabetes mellitus with other circulatory complications: Secondary | ICD-10-CM

## 2021-11-15 ENCOUNTER — Ambulatory Visit (INDEPENDENT_AMBULATORY_CARE_PROVIDER_SITE_OTHER): Payer: Medicare Other | Admitting: Podiatry

## 2021-11-15 DIAGNOSIS — M2142 Flat foot [pes planus] (acquired), left foot: Secondary | ICD-10-CM | POA: Diagnosis not present

## 2021-11-15 DIAGNOSIS — E1151 Type 2 diabetes mellitus with diabetic peripheral angiopathy without gangrene: Secondary | ICD-10-CM | POA: Diagnosis not present

## 2021-11-15 DIAGNOSIS — S91205A Unspecified open wound of left lesser toe(s) with damage to nail, initial encounter: Secondary | ICD-10-CM

## 2021-11-15 DIAGNOSIS — S91209A Unspecified open wound of unspecified toe(s) with damage to nail, initial encounter: Secondary | ICD-10-CM

## 2021-11-15 DIAGNOSIS — Z89412 Acquired absence of left great toe: Secondary | ICD-10-CM | POA: Diagnosis not present

## 2021-11-15 MED ORDER — DOXYCYCLINE HYCLATE 100 MG PO TABS: 100.0000 mg | ORAL_TABLET | Freq: Two times a day (BID) | ORAL | 0 refills | Status: DC

## 2021-11-15 NOTE — Progress Notes (Signed)
  Subjective:  Patient ID: Micheal Walls, male    DOB: [birth year],  MRN: [id number]  Chief Complaint  Patient presents with   Foot Ulcer    Left, 1 month follow up     DOS: 09/14/2020 Procedure: Left hallux amputation  86 y.o. male returns for post-op check.  Doing better thinks there is some issue with the second toenail  Review of Systems: Negative except as noted in the HPI. Denies N/V/F/Ch.   Objective:   There were no vitals filed for this visit.  There is no height or weight on file to calculate BMI. Constitutional Well developed. Well nourished.  Vascular Foot warm and well perfused. Capillary refill normal to all digits.   Neurologic Normal speech. Oriented to person, place, and time. Epicritic sensation to light touch grossly present bilaterally.  Dermatologic Lateral incision is fully healed.  The second toenail is quite loose and has some bleeding beneath the nail plate  Orthopedic:  Minimal tenderness to palpation noted about the surgical site.    ABI 0.7  Assessment:   1. Traumatic avulsion of nail plate of toe, initial encounter   2. Status post amputation of great toe, left ([place])   3. Type II diabetes mellitus with peripheral circulatory disorder (HCC)   4. Acquired pes planus, left           Plan:  Patient was evaluated and treated and all questions answered.  Ulcer remains well-healed.  I would like to get him fitted for new extra-depth diabetic shoes with multidensity insoles to continue to reduce his risk of ulceration he will be scheduled for this.      He had a new issue of a loose second toenail plate today.  Unclear how this happened.  I inspected the toenail and is nearly completely avulsed.  I am also remaining toenail.  This did not require anesthesia due to his neuropathy.  I inspected the nailbed and there was some very wound erythema but no deep ulceration no exposed bone or purulence.  I did recommend putting him on Keflex as a  precaution I will reevaluate his toenail in 2 weeks.  He will use the gentamicin ointment at home on the toenail daily.      Return in about 2 weeks (around 11/29/2021) for check wound on 2nd toe.

## 2021-11-17 ENCOUNTER — Ambulatory Visit (INDEPENDENT_AMBULATORY_CARE_PROVIDER_SITE_OTHER): Payer: Medicare Other | Admitting: Nurse Practitioner

## 2021-11-17 ENCOUNTER — Encounter: Payer: Self-pay | Admitting: Nurse Practitioner

## 2021-11-17 VITALS — BP 120/64 | HR 79 | Temp 98.4°F

## 2021-11-17 DIAGNOSIS — E039 Hypothyroidism, unspecified: Secondary | ICD-10-CM | POA: Diagnosis not present

## 2021-11-17 DIAGNOSIS — Z794 Long term (current) use of insulin: Secondary | ICD-10-CM | POA: Diagnosis not present

## 2021-11-17 DIAGNOSIS — L97522 Non-pressure chronic ulcer of other part of left foot with fat layer exposed: Secondary | ICD-10-CM

## 2021-11-17 DIAGNOSIS — Z79899 Other long term (current) drug therapy: Secondary | ICD-10-CM

## 2021-11-17 DIAGNOSIS — E1122 Type 2 diabetes mellitus with diabetic chronic kidney disease: Secondary | ICD-10-CM | POA: Diagnosis not present

## 2021-11-17 DIAGNOSIS — N1831 Chronic kidney disease, stage 3a: Secondary | ICD-10-CM | POA: Diagnosis not present

## 2021-11-17 DIAGNOSIS — E1159 Type 2 diabetes mellitus with other circulatory complications: Secondary | ICD-10-CM | POA: Diagnosis not present

## 2021-11-17 DIAGNOSIS — I739 Peripheral vascular disease, unspecified: Secondary | ICD-10-CM

## 2021-11-17 DIAGNOSIS — E782 Mixed hyperlipidemia: Secondary | ICD-10-CM

## 2021-11-17 NOTE — Patient Instructions (Signed)

## 2021-11-17 NOTE — Progress Notes (Signed)
I,Tianna Badgett,acting as a Education administrator for [employer], [name].,have documented all relevant documentation on the behalf of [name], [name],as directed by  [name], [name] while in the presence of [name], [place].  This visit occurred during the SARS-CoV-2 public health emergency.  Safety protocols were in place, including screening questions prior to the visit, additional usage of staff PPE, and extensive cleaning of exam room while observing appropriate contact time as indicated for disinfecting solutions.  Subjective:     Patient ID: Micheal Walls , male    DOB: [birth year] , 86 y.o.   MRN: [id number]   Chief Complaint  Patient presents with   Diabetes    HPI  Patient here for a f/u on his diabetes. He continues to see the foot doctor for his left foot wound, seen last on 07/22/2021, next appt is 08/12/2021. Myelodysplastic syndrome  Diabetes He presents for his follow-up diabetic visit. He has type 2 diabetes mellitus. His disease course has been improving. Pertinent negatives for hypoglycemia include no confusion, dizziness or nervousness/anxiousness. There are no diabetic associated symptoms. Pertinent negatives for diabetes include no fatigue, no polydipsia, no polyphagia and no polyuria. There are no hypoglycemic complications. Symptoms are improving. There are no diabetic complications. Risk factors for coronary artery disease include sedentary lifestyle and obesity. Current diabetic treatment includes oral agent (monotherapy) (no longer on Tresiba since December). He is compliant with treatment all of the time. His weight is stable. He is following a diabetic diet. When asked about meal planning, he reported none. He has not had a previous visit with a dietitian. He rarely participates in exercise. His home blood glucose trend is decreasing steadily. (Blood sugar 55-140's with one 160.) An ACE inhibitor/angiotensin II receptor blocker is being taken. He sees a podiatrist (Dr Adah Perl -  continues to be followed for his foot ulcer.).Eye exam is current (Dr. Zadie Rhine 06/24/2021).     Past Medical History:  Diagnosis Date   Acute metabolic encephalopathy 11/15/7844   Amputated below knee [facility])    right   Anemia    Aspiration pneumonia ([place]) 9/62/9528   Diastolic heart failure (HCC)    Diverticulosis    DM (diabetes mellitus) ([place])    Gout    Hyperlipemia    OSA on CPAP    RBBB    Sepsis ([place]) 11/02/2019   Systemic hypertension      Family History  Problem Relation Age of Onset   Diabetes Mother    Heart attack Father    Cancer Sister      Current Outpatient Medications:    allopurinol (ZYLOPRIM) 100 MG tablet, Take 100 mg by mouth 2 (two) times daily., Disp: , Rfl:    aspirin EC 81 MG tablet, Take 81 mg by mouth daily. Swallow whole., Disp: , Rfl:    colchicine 0.6 MG tablet, Take 0.6 mg by mouth once a week., Disp: , Rfl:    Continuous Blood Gluc Sensor (FREESTYLE LIBRE 14 DAY SENSOR) MISC, Use as directed to check blood sugars, Disp: 6 each, Rfl: 1   diltiazem (CARDIZEM CD) 240 MG 24 hr capsule, Take 1 capsule (240 mg total) by mouth daily., Disp: 90 capsule, Rfl: 3   dorzolamide (TRUSOPT) 2 % ophthalmic solution, Place 1 drop into both eyes 2 (two) times daily., Disp: , Rfl:    doxycycline (VIBRA-TABS) 100 MG tablet, Take 1 tablet (100 mg total) by mouth 2 (two) times daily., Disp: 20 tablet, Rfl: 0   ferrous sulfate 325 (65 FE)  MG tablet, Take 325 mg by mouth 2 (two) times daily with a meal., Disp: , Rfl:    furosemide (LASIX) 40 MG tablet, Take 1 tablet in the morning and one in the evening, Disp: 180 tablet, Rfl: 1   gabapentin (NEURONTIN) 100 MG capsule, TAKE TWO CAPSULES BY MOUTH EVERY MORNING and TAKE ONE CAPSULE BY MOUTH AT NOON and TAKE ONE CAPSULE BY MOUTH EVERY EVENING, Disp: 360 capsule, Rfl: 1   gentamicin cream (GARAMYCIN) 0.1 %, Apply 1 application. topically daily. To foot wound with dressing changes, Disp: 30 g, Rfl: 1   glucose blood (FREESTYLE  PRECISION NEO TEST) test strip, Use as instructed, Disp: 100 each, Rfl: 12   ketotifen (ZADITOR) 0.025 % ophthalmic solution, Place 1 drop into both eyes 2 (two) times daily., Disp: , Rfl:    latanoprost (XALATAN) 0.005 % ophthalmic solution, Place 1 drop into both eyes at bedtime. , Disp: , Rfl:    Multiple Vitamins-Minerals (MULTIVITAMIN WITH MINERALS) tablet, Take 1 tablet by mouth daily. Centrum men 50+, Disp: , Rfl:    Multiple Vitamins-Minerals (PRESERVISION AREDS) CAPS, Take 1 capsule by mouth 2 (two) times daily., Disp: , Rfl:    pantoprazole (PROTONIX) 40 MG tablet, Take 1 tablet (40 mg total) by mouth daily., Disp: 90 tablet, Rfl: 1   Polyvinyl Alcohol-Povidone PF (REFRESH) 1.4-0.6 % SOLN, Place 1 drop into both eyes 2 (two) times daily., Disp: , Rfl:    rosuvastatin (CRESTOR) 20 MG tablet, Take 1 tablet (20 mg total) by mouth daily., Disp: 90 tablet, Rfl: 3   Semaglutide, [name], (OZEMPIC, [name],) 2 MG/1.5ML SOPN, Inject 1 mg into the skin once a week., Disp: 9 mL, Rfl: 1   senna-docusate (SENOKOT-S) 8.6-50 MG tablet, Take 1 tablet by mouth 2 (two) times daily between meals as needed for mild constipation or moderate constipation. (Patient taking differently: Take 1 tablet by mouth 2 (two) times daily.), Disp: 60 tablet, Rfl: 0   SYNTHROID 25 MCG tablet, Take 1 tablet by mouth Monday - Friday, Disp: 60 tablet, Rfl: 2   tamsulosin (FLOMAX) 0.4 MG CAPS capsule, Take 0.4 mg by mouth daily., Disp: , Rfl:    Allergies  Allergen Reactions   Vancomycin Rash     Review of Systems  Constitutional:  Negative for fatigue.  Endocrine: Negative for polydipsia, polyphagia and polyuria.  Neurological:  Negative for dizziness.  Psychiatric/Behavioral:  Negative for confusion. The patient is not nervous/anxious.      Today's Vitals   11/17/21 0946  BP: 120/64  Pulse: 79  Temp: 98.4 F (36.9 C)  TempSrc: Oral   There is no height or weight on file to calculate BMI.   Objective:   Physical Exam Vitals reviewed.  Constitutional:      General: He is not in acute distress.    Appearance: Normal appearance. He is obese.  Cardiovascular:     Rate and Rhythm: Normal rate and regular rhythm.     Pulses: Normal pulses.     Heart sounds: Normal heart sounds. No murmur heard. Pulmonary:     Effort: Pulmonary effort is normal. No respiratory distress.     Breath sounds: Normal breath sounds. No wheezing.  Musculoskeletal:     Comments: Right AKA  Skin:    Capillary Refill: Capillary refill takes less than 2 seconds.     Comments: Continues to wear a dressing to his left foot that is being managed by his Podiatrist  Neurological:     General: No  focal deficit present.     Mental Status: He is alert and oriented to person, place, and time.     Cranial Nerves: No cranial nerve deficit.     Motor: No weakness.  Psychiatric:        Mood and Affect: Mood normal.        Behavior: Behavior normal.        Thought Content: Thought content normal.        Judgment: Judgment normal.         Assessment And Plan:     1. Type 2 diabetes mellitus with other circulatory complication, with long-term current use of insulin (HCC) Comments: Diabetes foot exam with decreased sensation and has a dressing on his foot for current wound. Diabetes is controlled. Continue current medications - Hemoglobin A1c  2. Acquired hypothyroidism Comments: Stable, continue current medications  3. Mixed hyperlipidemia Comments: Stable, continue statin. Tolerating well.  - BMP8+EGFR - Lipid panel  4. PAD (peripheral artery disease) ([place]) Comments: Continue follow up with Podiatry and statin.   5. Stage 3a chronic kidney disease ([place])  6. Other long term (current) drug therapy - CBC with Differential/Platelet  7. Ulcer of left foot with fat layer exposed ([place]) Comments: Continue follow up with Podiatry with treatment     Patient was given opportunity to ask questions. Patient verbalized  understanding of the plan and was able to repeat key elements of the plan. All questions were answered to their satisfaction.  [name], [name]   I, [name], [name], have reviewed all documentation for this visit. The documentation on 11/17/21 for the exam, diagnosis, procedures, and orders are all accurate and complete.   IF YOU HAVE BEEN REFERRED TO A SPECIALIST, IT MAY TAKE 1-2 WEEKS TO SCHEDULE/PROCESS THE REFERRAL. IF YOU HAVE NOT HEARD FROM US/SPECIALIST IN TWO WEEKS, PLEASE GIVE [place] A CALL AT [phone].   THE PATIENT IS ENCOURAGED TO PRACTICE SOCIAL DISTANCING DUE TO THE COVID-19 PANDEMIC.

## 2021-11-18 ENCOUNTER — Other Ambulatory Visit: Payer: Self-pay | Admitting: Nurse Practitioner

## 2021-11-18 DIAGNOSIS — D649 Anemia, unspecified: Secondary | ICD-10-CM

## 2021-11-18 LAB — LIPID PANEL
Chol/HDL Ratio: 3.3 ratio (ref 0.0–5.0)
Cholesterol, Total: 96 mg/dL — ABNORMAL LOW (ref 100–199)
HDL: 29 mg/dL — ABNORMAL LOW (ref >39)
LDL Chol Calc (NIH): 46 mg/dL (ref 0–99)
Triglycerides: 115 mg/dL (ref 0–149)
VLDL Cholesterol Cal: 21 mg/dL (ref 5–40)

## 2021-11-18 LAB — BMP8+EGFR
BUN/Creatinine Ratio: 31 — ABNORMAL HIGH (ref 10–24)
BUN: 56 mg/dL — ABNORMAL HIGH (ref 8–27)
CO2: 22 mmol/L (ref 20–29)
Calcium: 9.6 mg/dL (ref 8.6–10.2)
Chloride: 100 mmol/L (ref 96–106)
Creatinine, Ser: 1.78 mg/dL — ABNORMAL HIGH (ref 0.76–1.27)
Glucose: 133 mg/dL — ABNORMAL HIGH (ref 70–99)
Potassium: 3.7 mmol/L (ref 3.5–5.2)
Sodium: 137 mmol/L (ref 134–144)
eGFR: 37 mL/min/{1.73_m2} — ABNORMAL LOW (ref >59)

## 2021-11-18 LAB — CBC WITH DIFFERENTIAL/PLATELET
Basophils Absolute: 0 10*3/uL (ref 0.0–0.2)
Basos: 0 %
EOS (ABSOLUTE): 0.2 10*3/uL (ref 0.0–0.4)
Eos: 4 %
Hematocrit: 30.9 % — ABNORMAL LOW (ref 37.5–51.0)
Hemoglobin: 10.2 g/dL — ABNORMAL LOW (ref 13.0–17.7)
Immature Grans (Abs): 0 10*3/uL (ref 0.0–0.1)
Immature Granulocytes: 0 %
Lymphocytes Absolute: 1.4 10*3/uL (ref 0.7–3.1)
Lymphs: 24 %
MCH: 29.1 pg (ref 26.6–33.0)
MCHC: 33 g/dL (ref 31.5–35.7)
MCV: 88 fL (ref 79–97)
Monocytes Absolute: 0.5 10*3/uL (ref 0.1–0.9)
Monocytes: 9 %
Neutrophils Absolute: 3.7 10*3/uL (ref 1.4–7.0)
Neutrophils: 63 %
Platelets: 194 10*3/uL (ref 150–450)
RBC: 3.5 x10E6/uL — ABNORMAL LOW (ref 4.14–5.80)
RDW: 14.8 % (ref 11.6–15.4)
WBC: 5.9 10*3/uL (ref 3.4–10.8)

## 2021-11-18 LAB — HEMOGLOBIN A1C
Est. average glucose Bld gHb Est-mCnc: 123 mg/dL
Hgb A1c MFr Bld: 5.9 % — ABNORMAL HIGH (ref 4.8–5.6)

## 2021-11-19 ENCOUNTER — Ambulatory Visit (INDEPENDENT_AMBULATORY_CARE_PROVIDER_SITE_OTHER): Payer: Medicare Other

## 2021-11-19 ENCOUNTER — Telehealth: Payer: Medicare Other

## 2021-11-19 DIAGNOSIS — Z794 Long term (current) use of insulin: Secondary | ICD-10-CM

## 2021-11-19 DIAGNOSIS — E039 Hypothyroidism, unspecified: Secondary | ICD-10-CM

## 2021-11-19 DIAGNOSIS — D649 Anemia, unspecified: Secondary | ICD-10-CM

## 2021-11-19 DIAGNOSIS — N1831 Chronic kidney disease, stage 3a: Secondary | ICD-10-CM

## 2021-11-19 DIAGNOSIS — I739 Peripheral vascular disease, unspecified: Secondary | ICD-10-CM

## 2021-11-19 NOTE — Chronic Care Management (AMB) (Signed)
Chronic Care Management   CCM RN Visit Note  11/19/2021 Name: Micheal Walls MRN: [id number] DOB: 01/12/1936  Subjective: Micheal Walls is a 86 y.o. year old male who is a primary care patient of [name], [place]. The care management team was consulted for assistance with disease management and care coordination needs.    Engaged with patient by telephone for follow up visit in response to provider referral for case management and/or care coordination services.   Consent to Services:  The patient was given information about Chronic Care Management services, agreed to services, and gave verbal consent prior to initiation of services.  Please see initial visit note for detailed documentation.   Patient agreed to services and verbal consent obtained.   Assessment: Review of patient past medical history, allergies, medications, health status, including review of consultants reports, laboratory and other test data, was performed as part of comprehensive evaluation and provision of chronic care management services.   SDOH (Social Determinants of Health) assessments and interventions performed:  Yes, no acute needs   CCM Care Plan  Allergies  Allergen Reactions   Vancomycin Rash    Outpatient Encounter Medications as of 11/19/2021  Medication Sig   allopurinol (ZYLOPRIM) 100 MG tablet Take 100 mg by mouth 2 (two) times daily.   aspirin EC 81 MG tablet Take 81 mg by mouth daily. Swallow whole.   colchicine 0.6 MG tablet Take 0.6 mg by mouth once a week.   Continuous Blood Gluc Sensor (FREESTYLE LIBRE 14 DAY SENSOR) MISC Use as directed to check blood sugars   diltiazem (CARDIZEM CD) 240 MG 24 hr capsule Take 1 capsule (240 mg total) by mouth daily.   dorzolamide (TRUSOPT) 2 % ophthalmic solution Place 1 drop into both eyes 2 (two) times daily.   doxycycline (VIBRA-TABS) 100 MG tablet Take 1 tablet (100 mg total) by mouth 2 (two) times daily.   ferrous sulfate 325 (65 FE) MG tablet  Take 325 mg by mouth 2 (two) times daily with a meal.   furosemide (LASIX) 40 MG tablet Take 1 tablet in the morning and one in the evening   gabapentin (NEURONTIN) 100 MG capsule TAKE TWO CAPSULES BY MOUTH EVERY MORNING and TAKE ONE CAPSULE BY MOUTH AT NOON and TAKE ONE CAPSULE BY MOUTH EVERY EVENING   gentamicin cream (GARAMYCIN) 0.1 % Apply 1 application. topically daily. To foot wound with dressing changes   glucose blood (FREESTYLE PRECISION NEO TEST) test strip Use as instructed   ketotifen (ZADITOR) 0.025 % ophthalmic solution Place 1 drop into both eyes 2 (two) times daily.   latanoprost (XALATAN) 0.005 % ophthalmic solution Place 1 drop into both eyes at bedtime.    Multiple Vitamins-Minerals (MULTIVITAMIN WITH MINERALS) tablet Take 1 tablet by mouth daily. Centrum men 50+   Multiple Vitamins-Minerals (PRESERVISION AREDS) CAPS Take 1 capsule by mouth 2 (two) times daily.   pantoprazole (PROTONIX) 40 MG tablet Take 1 tablet (40 mg total) by mouth daily.   Polyvinyl Alcohol-Povidone PF (REFRESH) 1.4-0.6 % SOLN Place 1 drop into both eyes 2 (two) times daily.   rosuvastatin (CRESTOR) 20 MG tablet Take 1 tablet (20 mg total) by mouth daily.   Semaglutide, [name], (OZEMPIC, [name],) 2 MG/1.5ML SOPN Inject 1 mg into the skin once a week.   senna-docusate (SENOKOT-S) 8.6-50 MG tablet Take 1 tablet by mouth 2 (two) times daily between meals as needed for mild constipation or moderate constipation. (Patient taking differently: Take 1 tablet by mouth  2 (two) times daily.)   SYNTHROID 25 MCG tablet Take 1 tablet by mouth Monday - Friday   tamsulosin (FLOMAX) 0.4 MG CAPS capsule Take 0.4 mg by mouth daily.   No facility-administered encounter medications on file as of 11/19/2021.    Patient Active Problem List   Diagnosis Date Noted   Proliferative diabetic retinopathy of left eye ([place]) 10/18/2021   Chronic diastolic (congestive) heart failure ([place]) 08/11/2021   Pseudophakia of both eyes  04/12/2021   Infection of amputation stump of left lower extremity ([place]) 10/21/2020   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[place])    Left ankle swelling    Diabetic foot infection ([place])    Cerebral thrombosis with cerebral infarction 07/20/2020   Thrombocytopenia ([place]) 07/19/2020   Cerebral ischemia 07/18/2020   Bilateral hearing loss 05/20/2020   Does use hearing aid 05/20/2020   Ulcer of left foot with fat layer exposed ([place]) 01/16/2020   Proliferative diabetic retinopathy of right eye ([place]) 01/06/2020   Intermediate stage nonexudative age-related macular degeneration of both eyes 01/06/2020   Left epiretinal membrane 01/06/2020   [place] of right macula 01/06/2020   Cellulitis 11/02/2019   Elevated TSH 07/10/2018   Nephropathy 02/28/2018   Disturbance of skin sensation 02/28/2018   PAD (peripheral artery disease) ([place]) 07/17/2017   110.1 10/21/2013   Epiphora 04/24/2013   Laxity of eyelid 04/24/2013   Anemia 04/09/2013   History of peripheral vascular disease 04/09/2013   History of stroke 04/09/2013   S/P unilateral BKA (below knee amputation) ([place]) 03/17/2013   Diabetes mellitus type 2 in obese ([place]) 03/17/2013   Mixed hyperlipidemia 03/17/2013   Essential hypertension 03/17/2013   Gout 03/17/2013   CKD (chronic kidney disease), stage III ([place]) 03/17/2013   OSA on CPAP 03/17/2013   Anemia, iron deficiency 03/17/2013   Diverticulosis of colon 03/17/2013   RBBB 03/17/2013   Punctal stenosis, acquired 10/02/2012    Conditions to be addressed/monitored: DM II, PAD, CKD III, Hypothyroidism, Chronic Anemia   Care Plan : RN Care Manager Plan of Care  Updates made by Lynne Logan, RN since 11/19/2021 12:00 AM     Problem: Chronic disease education and Care Coordination needs for PAD, Cellulitis of left lower extremity, Neuropathy, Mixed hyperlipidemia, Type 2 diabetes mellitus, Stage 4 CKD   Priority: High     Long-Range Goal: Establish Plan for Chronic disease education and Care  Coordination needs for PAD, Cellulitis of left lower extremity, Neuropathy, Mixed hyperlipidemia, Type 2 diabetes mellitus, Stage 4 CKD   Start Date: 03/16/2021  Expected End Date: 03/16/2022  Recent Progress: On track  Priority: High  Note:   Current Barriers:  Knowledge Deficits related to plan of care for management of PAD, Cellulitis of left lower extremity, Neuropathy, Mixed hyperlipidemia, Type 2 diabetes mellitus, Stage 4 CKD  Chronic Disease Management support and education needs related to PAD, Cellulitis of left lower extremity, Neuropathy, Mixed hyperlipidemia, Type 2 diabetes mellitus, Stage 4 CKD   RNCM Clinical Goal(s):  Patient will verbalize understanding of plan for management of PAD, Cellulitis of left lower extremity, Neuropathy, Mixed hyperlipidemia, Type 2 diabetes mellitus  demonstrate Improved health management independence   continue to work with RN Care Manager to address care management and care coordination needs related to  PAD, Cellulitis of left lower extremity, Neuropathy, Mixed hyperlipidemia, Type 2 diabetes mellitus through collaboration with RN Care manager, provider, and care team.   Interventions: 1:1 collaboration with primary care provider regarding development and update of comprehensive plan of  care as evidenced by provider attestation and co-signature Inter-disciplinary care team collaboration (see longitudinal plan of care) Evaluation of current treatment plan related to  self management and patient's adherence to plan as established by provider  [place] Interventions: (Goal on track:  Yes.) Long Term Goal  Evaluation of current treatment plan related to  Cellulitis of left lower extremity , self-management and patient's adherence to plan as established by provider Reviewed and discussed recent follow up with Podiatry completed on 11/15/21 with the following Assessment/Plan noted:  Patient was evaluated and treated and all questions answered. Ulcer  remains well-healed.  I would like to get him fitted for new extra-depth diabetic shoes with multidensity insoles to continue to reduce his risk of ulceration he will be scheduled for this. He had a new issue of a loose second toenail plate today.  Unclear how this happened.  I inspected the toenail and is nearly completely avulsed.  I am also remaining toenail.  This did not require anesthesia due to his neuropathy.  I inspected the nailbed and there was some very wound erythema but no deep ulceration no exposed bone or purulence.  I did recommend putting him on Keflex as a precaution I will reevaluate his toenail in 2 weeks.  He will use the gentamicin ointment at home on the toenail daily. Return in about 2 weeks (around 11/29/2021) for check wound on 2nd toe.   Educated patient on the importance to complete full course of antibiotics as prescribed  Determined patient has a good understanding of his prescribed treatment plan and is adhering to MD recommendations Educated patient on signs/symptoms suggestive of worsening condition and when to call his doctor if needed  Assessed for SDOH barriers, none identified at this time  Discussed plans with patient for ongoing care management follow up and provided patient with direct contact information for care management team     Chronic Kidney Disease Interventions:  (Status:  Goal on track:  Yes.) Long Term Goal Assessed the Patient understanding of chronic kidney disease    Evaluation of current treatment plan related to chronic kidney disease self management and patient's adherence to plan as established by provider      Reviewed prescribed diet continue to drink 48 oz of water daily unless otherwise directed  Assessed social determinant of health barriers    Provided education on kidney disease progression    Last practice recorded BP readings:  BP Readings from Last 3 Encounters:  11/17/21 120/64  08/11/21 132/70  07/13/21 136/64  Most recent  eGFR/CrCl:  Lab Results  Component Value Date   EGFR 37 (L) 11/17/2021    No components found for: "CRCL"   Diabetes Interventions:  (Status:  Goal on track:  Yes.) Long Term Goal Assessed patient's understanding of A1c goal:  <5.7 %  Review of patient status, including review of consultants reports, relevant laboratory and other test results, and medications completed Positive reinforcement given to patient for lowering his A1c Educated patient on target A1c and encouraged ongoing dietary adherence  Lab Results  Component Value Date   HGBA1C 5.9 (H) 11/17/2021   Anemia/Bleeding Interventions:  (Status:  New goal.) Long Term Goal Review of patient status, including review of consultant's reports, relevant laboratory and other test results Educated patient while providing rationale basic disease process of anemia secondary to having chronic kidney disease Discussed PCP referral for Hematology/Oncology to further evaluate and treat this condition Educated patient on referral process and what to expect with next steps  Lab Results  Component Value Date   WBC 5.9 11/17/2021   HGB 10.2 (L) 11/17/2021   HCT 30.9 (L) 11/17/2021   MCV 88 11/17/2021   PLT 194 11/17/2021   Low HDL Interventions:  (Status:  New goal.) Long Term Goal Medication review performed; medication list updated in electronic medical record Provider established cholesterol goals reviewed Provided verbal and printed HLD educational materials Lipid Panel     Component Value Date/Time   CHOL 96 (L) 11/17/2021 1040   TRIG 115 11/17/2021 1040   HDL 29 (L) 11/17/2021 1040   CHOLHDL 3.3 11/17/2021 1040   CHOLHDL 2.4 07/19/2020 0330   VLDL 9 07/19/2020 0330   LDLCALC 46 11/17/2021 1040   LABVLDL 21 11/17/2021 1040    Patient Goals/Self-Care Activities: Take all medications as prescribed Attend all scheduled provider appointments Call pharmacy for medication refills 3-7 days in advance of running out of  medications Call provider office for new concerns or questions  drink 6 to 8 glasses of water each day eat fish at least once per week manage portion size Increase your foods containing healthy fats as discussed (see printed list mailed to home) Follow up with Hematology/Oncology as directed to evaluate/treat your Anemia   Follow Up Plan:  Telephone follow up appointment with care management team member scheduled for:  03/23/22     Barb Merino, RN, BSN, CCM Care Management Coordinator [place] [facility]  Direct Phone: [phone]

## 2021-11-19 NOTE — Patient Instructions (Signed)
Visit Information  Thank you for taking time to visit with me today. Please don't hesitate to contact me if I can be of assistance to you before our next scheduled telephone appointment.  Following are the goals we discussed today:  (Copy and paste patient goals from clinical care plan here)  Our next appointment is by telephone on 03/23/22 at 10:30 AM  Please call the care guide team at [phone] if you need to cancel or reschedule your appointment.   If you are experiencing a Mental Health or [place] or need someone to talk to, please call [phone]-TALK (toll free, 24 hour hotline)   Patient verbalizes understanding of instructions and care plan provided today and agrees to view in [place]. Active MyChart status and patient understanding of how to access instructions and care plan via MyChart confirmed with patient.     Barb Merino, RN, BSN, CCM Care Management Coordinator [place] [facility]  Direct Phone: [phone]

## 2021-12-01 ENCOUNTER — Telehealth: Payer: Self-pay | Admitting: Physician Assistant

## 2021-12-01 NOTE — Telephone Encounter (Signed)
Scheduled appt per 6/8 referral. Pt is aware of appt date and time. Pt is aware to arrive 15 mins prior to appt time and to bring and updated insurance card. Pt is aware of appt location.

## 2021-12-02 ENCOUNTER — Ambulatory Visit (INDEPENDENT_AMBULATORY_CARE_PROVIDER_SITE_OTHER): Payer: Medicare Other | Admitting: Podiatry

## 2021-12-02 ENCOUNTER — Inpatient Hospital Stay (HOSPITAL_COMMUNITY)
Admission: AD | Admit: 2021-12-02 | Discharge: 2021-12-04 | DRG: 617 | Disposition: A | Discharge status: Home / Self Care | Payer: Medicare Other | Source: Ambulatory Visit | Attending: Family Medicine | Admitting: Family Medicine

## 2021-12-02 DIAGNOSIS — Z89511 Acquired absence of right leg below knee: Secondary | ICD-10-CM | POA: Diagnosis not present

## 2021-12-02 DIAGNOSIS — I77810 Thoracic aortic ectasia: Secondary | ICD-10-CM | POA: Diagnosis present

## 2021-12-02 DIAGNOSIS — Z87891 Personal history of nicotine dependence: Secondary | ICD-10-CM

## 2021-12-02 DIAGNOSIS — Z833 Family history of diabetes mellitus: Secondary | ICD-10-CM

## 2021-12-02 DIAGNOSIS — E782 Mixed hyperlipidemia: Secondary | ICD-10-CM | POA: Diagnosis present

## 2021-12-02 DIAGNOSIS — M86671 Other chronic osteomyelitis, right ankle and foot: Secondary | ICD-10-CM | POA: Diagnosis not present

## 2021-12-02 DIAGNOSIS — E1122 Type 2 diabetes mellitus with diabetic chronic kidney disease: Secondary | ICD-10-CM | POA: Diagnosis present

## 2021-12-02 DIAGNOSIS — L0390 Cellulitis, unspecified: Secondary | ICD-10-CM | POA: Diagnosis not present

## 2021-12-02 DIAGNOSIS — E1151 Type 2 diabetes mellitus with diabetic peripheral angiopathy without gangrene: Secondary | ICD-10-CM | POA: Diagnosis present

## 2021-12-02 DIAGNOSIS — Z79899 Other long term (current) drug therapy: Secondary | ICD-10-CM

## 2021-12-02 DIAGNOSIS — N183 Chronic kidney disease, stage 3 unspecified: Secondary | ICD-10-CM | POA: Diagnosis present

## 2021-12-02 DIAGNOSIS — R600 Localized edema: Secondary | ICD-10-CM | POA: Diagnosis not present

## 2021-12-02 DIAGNOSIS — M86172 Other acute osteomyelitis, left ankle and foot: Secondary | ICD-10-CM | POA: Diagnosis not present

## 2021-12-02 DIAGNOSIS — Z881 Allergy status to other antibiotic agents status: Secondary | ICD-10-CM | POA: Diagnosis not present

## 2021-12-02 DIAGNOSIS — L089 Local infection of the skin and subcutaneous tissue, unspecified: Secondary | ICD-10-CM | POA: Diagnosis present

## 2021-12-02 DIAGNOSIS — M869 Osteomyelitis, unspecified: Secondary | ICD-10-CM | POA: Diagnosis not present

## 2021-12-02 DIAGNOSIS — I509 Heart failure, unspecified: Secondary | ICD-10-CM | POA: Diagnosis not present

## 2021-12-02 DIAGNOSIS — I130 Hypertensive heart and chronic kidney disease with heart failure and stage 1 through stage 4 chronic kidney disease, or unspecified chronic kidney disease: Secondary | ICD-10-CM | POA: Diagnosis present

## 2021-12-02 DIAGNOSIS — M868X7 Other osteomyelitis, ankle and foot: Secondary | ICD-10-CM | POA: Diagnosis not present

## 2021-12-02 DIAGNOSIS — M109 Gout, unspecified: Secondary | ICD-10-CM | POA: Diagnosis present

## 2021-12-02 DIAGNOSIS — Z8249 Family history of ischemic heart disease and other diseases of the circulatory system: Secondary | ICD-10-CM | POA: Diagnosis not present

## 2021-12-02 DIAGNOSIS — L97529 Non-pressure chronic ulcer of other part of left foot with unspecified severity: Secondary | ICD-10-CM | POA: Diagnosis not present

## 2021-12-02 DIAGNOSIS — E039 Hypothyroidism, unspecified: Secondary | ICD-10-CM | POA: Diagnosis present

## 2021-12-02 DIAGNOSIS — Z7982 Long term (current) use of aspirin: Secondary | ICD-10-CM | POA: Diagnosis not present

## 2021-12-02 DIAGNOSIS — I5032 Chronic diastolic (congestive) heart failure: Secondary | ICD-10-CM | POA: Diagnosis not present

## 2021-12-02 DIAGNOSIS — N1832 Chronic kidney disease, stage 3b: Secondary | ICD-10-CM | POA: Diagnosis present

## 2021-12-02 DIAGNOSIS — N400 Enlarged prostate without lower urinary tract symptoms: Secondary | ICD-10-CM | POA: Diagnosis present

## 2021-12-02 DIAGNOSIS — I739 Peripheral vascular disease, unspecified: Secondary | ICD-10-CM | POA: Diagnosis present

## 2021-12-02 DIAGNOSIS — R946 Abnormal results of thyroid function studies: Secondary | ICD-10-CM | POA: Diagnosis present

## 2021-12-02 DIAGNOSIS — D509 Iron deficiency anemia, unspecified: Secondary | ICD-10-CM | POA: Diagnosis not present

## 2021-12-02 DIAGNOSIS — R7989 Other specified abnormal findings of blood chemistry: Secondary | ICD-10-CM | POA: Diagnosis present

## 2021-12-02 DIAGNOSIS — E1169 Type 2 diabetes mellitus with other specified complication: Principal | ICD-10-CM | POA: Diagnosis present

## 2021-12-02 DIAGNOSIS — G4733 Obstructive sleep apnea (adult) (pediatric): Secondary | ICD-10-CM | POA: Diagnosis present

## 2021-12-02 DIAGNOSIS — E669 Obesity, unspecified: Secondary | ICD-10-CM | POA: Diagnosis present

## 2021-12-02 DIAGNOSIS — Z89412 Acquired absence of left great toe: Secondary | ICD-10-CM

## 2021-12-02 DIAGNOSIS — I110 Hypertensive heart disease with heart failure: Secondary | ICD-10-CM | POA: Diagnosis not present

## 2021-12-02 DIAGNOSIS — Z6832 Body mass index (BMI) 32.0-32.9, adult: Secondary | ICD-10-CM

## 2021-12-02 DIAGNOSIS — I10 Essential (primary) hypertension: Secondary | ICD-10-CM | POA: Diagnosis present

## 2021-12-02 DIAGNOSIS — Z8673 Personal history of transient ischemic attack (TIA), and cerebral infarction without residual deficits: Secondary | ICD-10-CM

## 2021-12-02 LAB — COMPREHENSIVE METABOLIC PANEL
ALT: 48 U/L — ABNORMAL HIGH (ref 0–44)
AST: 43 U/L — ABNORMAL HIGH (ref 15–41)
Albumin: 3 g/dL — ABNORMAL LOW (ref 3.5–5.0)
Alkaline Phosphatase: 79 U/L (ref 38–126)
Anion gap: 10 (ref 5–15)
BUN: 68 mg/dL — ABNORMAL HIGH (ref 8–23)
CO2: 25 mmol/L (ref 22–32)
Calcium: 9.5 mg/dL (ref 8.9–10.3)
Chloride: 100 mmol/L (ref 98–111)
Creatinine, Ser: 2.1 mg/dL — ABNORMAL HIGH (ref 0.61–1.24)
GFR, Estimated: 30 mL/min — ABNORMAL LOW (ref >60)
Glucose, Bld: 112 mg/dL — ABNORMAL HIGH (ref 70–99)
Potassium: 4.1 mmol/L (ref 3.5–5.1)
Sodium: 135 mmol/L (ref 135–145)
Total Bilirubin: 0.4 mg/dL (ref 0.3–1.2)
Total Protein: 6.9 g/dL (ref 6.5–8.1)

## 2021-12-02 LAB — CBC WITH DIFFERENTIAL/PLATELET
Abs Immature Granulocytes: 0.01 10*3/uL (ref 0.00–0.07)
Basophils Absolute: 0 10*3/uL (ref 0.0–0.1)
Basophils Relative: 0 %
Eosinophils Absolute: 0.3 10*3/uL (ref 0.0–0.5)
Eosinophils Relative: 5 %
HCT: 29.5 % — ABNORMAL LOW (ref 39.0–52.0)
Hemoglobin: 9.6 g/dL — ABNORMAL LOW (ref 13.0–17.0)
Immature Granulocytes: 0 %
Lymphocytes Relative: 22 %
Lymphs Abs: 1.4 10*3/uL (ref 0.7–4.0)
MCH: 28.7 pg (ref 26.0–34.0)
MCHC: 32.5 g/dL (ref 30.0–36.0)
MCV: 88.1 fL (ref 80.0–100.0)
Monocytes Absolute: 0.6 10*3/uL (ref 0.1–1.0)
Monocytes Relative: 10 %
Neutro Abs: 3.9 10*3/uL (ref 1.7–7.7)
Neutrophils Relative %: 63 %
Platelets: 229 10*3/uL (ref 150–400)
RBC: 3.35 MIL/uL — ABNORMAL LOW (ref 4.22–5.81)
RDW: 15.2 % (ref 11.5–15.5)
WBC: 6.3 10*3/uL (ref 4.0–10.5)
nRBC: 0 % (ref 0.0–0.2)

## 2021-12-02 LAB — SEDIMENTATION RATE: Sed Rate: 78 mm/hr — ABNORMAL HIGH (ref 0–16)

## 2021-12-02 LAB — C-REACTIVE PROTEIN: CRP: 4.2 mg/dL — ABNORMAL HIGH (ref <1.0)

## 2021-12-02 LAB — GLUCOSE, CAPILLARY: Glucose-Capillary: 103 mg/dL — ABNORMAL HIGH (ref 70–99)

## 2021-12-02 LAB — PROTIME-INR
INR: 1 (ref 0.8–1.2)
Prothrombin Time: 13.5 seconds (ref 11.4–15.2)

## 2021-12-02 LAB — LACTIC ACID, PLASMA
Lactic Acid, Venous: 0.9 mmol/L (ref 0.5–1.9)
Lactic Acid, Venous: 0.8 mmol/L (ref 0.5–1.9)

## 2021-12-02 MED ORDER — GABAPENTIN 100 MG PO CAPS: 100.0000 mg | ORAL_CAPSULE | Freq: Three times a day (TID) | ORAL | Status: DC

## 2021-12-02 MED ORDER — POLYVINYL ALCOHOL 1.4 % OP SOLN
1.0000 [drp] | Freq: Two times a day (BID) | OPHTHALMIC | Status: DC
Start: 2021-12-02 — End: 2021-12-04
  Administered 2021-12-02 – 2021-12-04 (×4): 1 [drp] via OPHTHALMIC
  Filled 2021-12-02: qty 15

## 2021-12-02 MED ORDER — SODIUM CHLORIDE 0.9% FLUSH
3.0000 mL | Freq: Two times a day (BID) | INTRAVENOUS | Status: DC
  Administered 2021-12-02 – 2021-12-04 (×4): 3 mL via INTRAVENOUS

## 2021-12-02 MED ORDER — PROSIGHT PO TABS
1.0000 | ORAL_TABLET | Freq: Two times a day (BID) | ORAL | Status: DC
  Administered 2021-12-02 – 2021-12-04 (×4): 1 via ORAL
  Filled 2021-12-02 (×4): qty 1

## 2021-12-02 MED ORDER — TAMSULOSIN HCL 0.4 MG PO CAPS
0.4000 mg | ORAL_CAPSULE | Freq: Every day | ORAL | Status: DC
  Administered 2021-12-03 – 2021-12-04 (×2): 0.4 mg via ORAL
  Filled 2021-12-02 (×2): qty 1

## 2021-12-02 MED ORDER — LEVOTHYROXINE SODIUM 25 MCG PO TABS
25.0000 ug | ORAL_TABLET | ORAL | Status: DC
  Administered 2021-12-03: 25 ug via ORAL
  Filled 2021-12-02: qty 1

## 2021-12-02 MED ORDER — ALLOPURINOL 100 MG PO TABS
100.0000 mg | ORAL_TABLET | Freq: Two times a day (BID) | ORAL | Status: DC
  Administered 2021-12-02 – 2021-12-04 (×4): 100 mg via ORAL
  Filled 2021-12-02 (×4): qty 1

## 2021-12-02 MED ORDER — INSULIN ASPART 100 UNIT/ML IJ SOLN
0.0000 [IU] | Freq: Three times a day (TID) | INTRAMUSCULAR | Status: DC
  Administered 2021-12-03: 1 [IU] via SUBCUTANEOUS

## 2021-12-02 MED ORDER — SODIUM CHLORIDE 0.9 % IV SOLN: 250.0000 mL | INTRAVENOUS | Status: DC | PRN

## 2021-12-02 MED ORDER — DILTIAZEM HCL ER COATED BEADS 120 MG PO CP24
240.0000 mg | ORAL_CAPSULE | Freq: Every day | ORAL | Status: DC
  Administered 2021-12-03 – 2021-12-04 (×2): 240 mg via ORAL
  Filled 2021-12-02 (×2): qty 2

## 2021-12-02 MED ORDER — GABAPENTIN 100 MG PO CAPS
100.0000 mg | ORAL_CAPSULE | Freq: Two times a day (BID) | ORAL | Status: DC
  Administered 2021-12-02 – 2021-12-04 (×3): 100 mg via ORAL
  Filled 2021-12-02 (×4): qty 1

## 2021-12-02 MED ORDER — ROSUVASTATIN CALCIUM 20 MG PO TABS
20.0000 mg | ORAL_TABLET | Freq: Every day | ORAL | Status: DC
  Administered 2021-12-03 – 2021-12-04 (×2): 20 mg via ORAL
  Filled 2021-12-02 (×2): qty 1

## 2021-12-02 MED ORDER — SENNOSIDES-DOCUSATE SODIUM 8.6-50 MG PO TABS
1.0000 | ORAL_TABLET | Freq: Two times a day (BID) | ORAL | Status: DC | PRN
Start: 2021-12-02 — End: 2021-12-04

## 2021-12-02 MED ORDER — FUROSEMIDE 40 MG PO TABS
40.0000 mg | ORAL_TABLET | Freq: Two times a day (BID) | ORAL | Status: DC
  Administered 2021-12-03 – 2021-12-04 (×2): 40 mg via ORAL
  Filled 2021-12-02 (×2): qty 1

## 2021-12-02 MED ORDER — GABAPENTIN 100 MG PO CAPS
200.0000 mg | ORAL_CAPSULE | Freq: Every day | ORAL | Status: DC
  Administered 2021-12-03: 200 mg via ORAL
  Filled 2021-12-02: qty 2

## 2021-12-02 MED ORDER — DORZOLAMIDE HCL 2 % OP SOLN
1.0000 [drp] | Freq: Two times a day (BID) | OPHTHALMIC | Status: DC
Start: 2021-12-02 — End: 2021-12-04
  Administered 2021-12-02 – 2021-12-04 (×4): 1 [drp] via OPHTHALMIC
  Filled 2021-12-02: qty 10

## 2021-12-02 MED ORDER — SODIUM CHLORIDE 0.9% FLUSH: 3.0000 mL | INTRAVENOUS | Status: DC | PRN

## 2021-12-02 MED ORDER — ACETAMINOPHEN 650 MG RE SUPP: 650.0000 mg | Freq: Four times a day (QID) | RECTAL | Status: DC | PRN

## 2021-12-02 MED ORDER — PANTOPRAZOLE SODIUM 40 MG PO TBEC
40.0000 mg | DELAYED_RELEASE_TABLET | Freq: Every day | ORAL | Status: DC
  Administered 2021-12-03 – 2021-12-04 (×2): 40 mg via ORAL
  Filled 2021-12-02 (×2): qty 1

## 2021-12-02 MED ORDER — LATANOPROST 0.005 % OP SOLN
1.0000 [drp] | Freq: Every day | OPHTHALMIC | Status: DC
Start: 2021-12-02 — End: 2021-12-04
  Administered 2021-12-02 – 2021-12-03 (×2): 1 [drp] via OPHTHALMIC
  Filled 2021-12-02: qty 2.5

## 2021-12-02 MED ORDER — HYDROCODONE-ACETAMINOPHEN 5-325 MG PO TABS: 1.0000 | ORAL_TABLET | ORAL | Status: DC | PRN

## 2021-12-02 MED ORDER — KETOTIFEN FUMARATE 0.025 % OP SOLN
1.0000 [drp] | Freq: Two times a day (BID) | OPHTHALMIC | Status: DC
Start: 2021-12-02 — End: 2021-12-04
  Administered 2021-12-02 – 2021-12-04 (×4): 1 [drp] via OPHTHALMIC
  Filled 2021-12-02: qty 5

## 2021-12-02 MED ORDER — ACETAMINOPHEN 325 MG PO TABS: 650.0000 mg | ORAL_TABLET | Freq: Four times a day (QID) | ORAL | Status: DC | PRN

## 2021-12-02 MED ORDER — FERROUS SULFATE 325 (65 FE) MG PO TABS
325.0000 mg | ORAL_TABLET | Freq: Two times a day (BID) | ORAL | Status: DC
  Administered 2021-12-03 – 2021-12-04 (×3): 325 mg via ORAL
  Filled 2021-12-02 (×3): qty 1

## 2021-12-02 NOTE — Assessment & Plan Note (Signed)
cpap at night

## 2021-12-02 NOTE — Assessment & Plan Note (Addendum)
Well controlled, recent A1C of 5.9  Hold ozempic while in patient SSI and accuchecks QAC/HS

## 2021-12-02 NOTE — Assessment & Plan Note (Signed)
Euvolemic on exam today Last echo: 07/2020. Normal EF with normal LVF. Mild aortic root dilation. Indeterminate diastolic function  Continue lasix Strict I/O

## 2021-12-02 NOTE — Assessment & Plan Note (Signed)
86 year old male with history of PAD s/p right BKA and left great toe amputation with history of 2nd left toe infection and osteomyelitis sent to hospital for amputation from podiatry office -admit to med surg -podiatry following with plans for amputation -ABI ordered of left leg, last one in 02/2021 with LLE non compressible arteries.  -baseline labs of cbc, cmp, lactic acid, BC, ESR/CRP pending -SCDs and NPO at midnight for possible surgery tomorrow

## 2021-12-02 NOTE — Assessment & Plan Note (Signed)
TSH wnl in 08/2021 Continue home synthroid

## 2021-12-02 NOTE — Assessment & Plan Note (Signed)
Continue crestor 

## 2021-12-02 NOTE — Progress Notes (Signed)
Pt admitted as direct admit from home, accompanied by his son. Pt alert/oriented in no apparent distress, welcome guide/menu  provided to pt/son with instructions. Pt/son vebalized understanding of instruciton. Hospital valuables policy has been discussed with no complaints. Pt orientated/situated to room/equipments. Hospital bed in lowest position with 3 side rails up, call bell/room phone within reach and all wheels locked.

## 2021-12-02 NOTE — Assessment & Plan Note (Signed)
No signs of flair Continue allopurinol

## 2021-12-02 NOTE — Progress Notes (Signed)
RT placed patient on CPAP HS. 2L O2 bleed in needed. Patient tolerating well at this time. 

## 2021-12-03 ENCOUNTER — Inpatient Hospital Stay (HOSPITAL_COMMUNITY): Payer: Medicare Other | Admitting: Registered Nurse

## 2021-12-03 ENCOUNTER — Encounter (HOSPITAL_COMMUNITY)
Admission: AD | Disposition: A | Discharge status: Home / Self Care | Payer: Self-pay | Source: Ambulatory Visit | Attending: Family Medicine

## 2021-12-03 ENCOUNTER — Other Ambulatory Visit: Payer: Self-pay

## 2021-12-03 ENCOUNTER — Inpatient Hospital Stay (HOSPITAL_COMMUNITY): Payer: Medicare Other

## 2021-12-03 ENCOUNTER — Encounter (HOSPITAL_COMMUNITY): Payer: Self-pay | Admitting: Family Medicine

## 2021-12-03 DIAGNOSIS — M86671 Other chronic osteomyelitis, right ankle and foot: Secondary | ICD-10-CM

## 2021-12-03 DIAGNOSIS — M869 Osteomyelitis, unspecified: Secondary | ICD-10-CM | POA: Diagnosis not present

## 2021-12-03 DIAGNOSIS — L0390 Cellulitis, unspecified: Secondary | ICD-10-CM

## 2021-12-03 DIAGNOSIS — I509 Heart failure, unspecified: Secondary | ICD-10-CM | POA: Diagnosis not present

## 2021-12-03 DIAGNOSIS — I110 Hypertensive heart disease with heart failure: Secondary | ICD-10-CM

## 2021-12-03 DIAGNOSIS — E1169 Type 2 diabetes mellitus with other specified complication: Secondary | ICD-10-CM | POA: Diagnosis not present

## 2021-12-03 HISTORY — PX: AMPUTATION TOE: SHX6595

## 2021-12-03 LAB — GLUCOSE, CAPILLARY
Glucose-Capillary: 126 mg/dL — ABNORMAL HIGH (ref 70–99)
Glucose-Capillary: 114 mg/dL — ABNORMAL HIGH (ref 70–99)
Glucose-Capillary: 109 mg/dL — ABNORMAL HIGH (ref 70–99)
Glucose-Capillary: 99 mg/dL (ref 70–99)
Glucose-Capillary: 90 mg/dL (ref 70–99)
Glucose-Capillary: 100 mg/dL — ABNORMAL HIGH (ref 70–99)

## 2021-12-03 LAB — BASIC METABOLIC PANEL
Anion gap: 10 (ref 5–15)
BUN: 67 mg/dL — ABNORMAL HIGH (ref 8–23)
CO2: 27 mmol/L (ref 22–32)
Calcium: 9.7 mg/dL (ref 8.9–10.3)
Chloride: 103 mmol/L (ref 98–111)
Creatinine, Ser: 1.96 mg/dL — ABNORMAL HIGH (ref 0.61–1.24)
GFR, Estimated: 33 mL/min — ABNORMAL LOW (ref >60)
Glucose, Bld: 106 mg/dL — ABNORMAL HIGH (ref 70–99)
Potassium: 4 mmol/L (ref 3.5–5.1)
Sodium: 140 mmol/L (ref 135–145)

## 2021-12-03 LAB — CBC
HCT: 29.2 % — ABNORMAL LOW (ref 39.0–52.0)
Hemoglobin: 9.9 g/dL — ABNORMAL LOW (ref 13.0–17.0)
MCH: 29.5 pg (ref 26.0–34.0)
MCHC: 33.9 g/dL (ref 30.0–36.0)
MCV: 86.9 fL (ref 80.0–100.0)
Platelets: 212 10*3/uL (ref 150–400)
RBC: 3.36 MIL/uL — ABNORMAL LOW (ref 4.22–5.81)
RDW: 15.2 % (ref 11.5–15.5)
WBC: 5.5 10*3/uL (ref 4.0–10.5)
nRBC: 0 % (ref 0.0–0.2)

## 2021-12-03 LAB — SURGICAL PCR SCREEN
MRSA, PCR: NEGATIVE
Staphylococcus aureus: NEGATIVE

## 2021-12-03 SURGERY — AMPUTATION, TOE
Anesthesia: Monitor Anesthesia Care | Site: Toe | Laterality: Left

## 2021-12-03 MED ORDER — SODIUM CHLORIDE 0.9 % IV SOLN
2.0000 g | Freq: Two times a day (BID) | INTRAVENOUS | Status: DC
  Administered 2021-12-03 – 2021-12-04 (×3): 2 g via INTRAVENOUS
  Filled 2021-12-03 (×3): qty 12.5

## 2021-12-03 MED ORDER — LACTATED RINGERS IV SOLN: INTRAVENOUS | Status: DC

## 2021-12-03 MED ORDER — CHLORHEXIDINE GLUCONATE CLOTH 2 % EX PADS
6.0000 | MEDICATED_PAD | Freq: Once | CUTANEOUS | Status: AC
  Administered 2021-12-03: 6 via TOPICAL

## 2021-12-03 MED ORDER — LIDOCAINE HCL 2 % IJ SOLN
INTRAMUSCULAR | Status: AC
  Filled 2021-12-03: qty 20

## 2021-12-03 MED ORDER — LIDOCAINE HCL 2 % IJ SOLN
INTRAMUSCULAR | Status: DC | PRN
  Administered 2021-12-03: 6 mL via INTRADERMAL

## 2021-12-03 MED ORDER — CHLORHEXIDINE GLUCONATE 0.12 % MT SOLN
OROMUCOSAL | Status: AC
  Administered 2021-12-03: 15 mL via OROMUCOSAL
  Filled 2021-12-03: qty 15

## 2021-12-03 MED ORDER — PROPOFOL 10 MG/ML IV BOLUS
INTRAVENOUS | Status: DC | PRN
  Administered 2021-12-03: 20 mg via INTRAVENOUS

## 2021-12-03 MED ORDER — ORAL CARE MOUTH RINSE: 15.0000 mL | Freq: Once | OROMUCOSAL | Status: AC

## 2021-12-03 MED ORDER — MIDAZOLAM HCL 2 MG/2ML IJ SOLN
INTRAMUSCULAR | Status: AC
  Filled 2021-12-03: qty 2

## 2021-12-03 MED ORDER — 0.9 % SODIUM CHLORIDE (POUR BTL) OPTIME
TOPICAL | Status: DC | PRN
  Administered 2021-12-03: 1000 mL

## 2021-12-03 MED ORDER — INSULIN ASPART 100 UNIT/ML IJ SOLN: 0.0000 [IU] | INTRAMUSCULAR | Status: DC | PRN

## 2021-12-03 MED ORDER — FENTANYL CITRATE (PF) 250 MCG/5ML IJ SOLN
INTRAMUSCULAR | Status: AC
  Filled 2021-12-03: qty 5

## 2021-12-03 MED ORDER — CHLORHEXIDINE GLUCONATE 0.12 % MT SOLN: 15.0000 mL | Freq: Once | OROMUCOSAL | Status: AC

## 2021-12-03 SURGICAL SUPPLY — 43 items
BAG COUNTER SPONGE SURGICOUNT (BAG) ×2 IMPLANT
BLADE SURG 15 STRL LF DISP TIS (BLADE) IMPLANT
BLADE SURG 15 STRL SS (BLADE)
BNDG ELASTIC 4X5.8 VLCR NS LF (GAUZE/BANDAGES/DRESSINGS) ×1 IMPLANT
BNDG ELASTIC 4X5.8 VLCR STR LF (GAUZE/BANDAGES/DRESSINGS) IMPLANT
BNDG GAUZE DERMACEA FLUFF (GAUZE/BANDAGES/DRESSINGS) ×1
BNDG GAUZE DERMACEA FLUFF 4 (GAUZE/BANDAGES/DRESSINGS) IMPLANT
BNDG GAUZE ELAST 4 BULKY (GAUZE/BANDAGES/DRESSINGS) ×2 IMPLANT
CHLORAPREP W/TINT 26 (MISCELLANEOUS) IMPLANT
COVER SURGICAL LIGHT HANDLE (MISCELLANEOUS) ×2 IMPLANT
CUFF TOURN SGL QUICK 18X4 (TOURNIQUET CUFF) ×2 IMPLANT
CUFF TOURN SGL QUICK 24 (TOURNIQUET CUFF)
CUFF TRNQT CYL 24X4X16.5-23 (TOURNIQUET CUFF) IMPLANT
DRSG ADAPTIC 3X8 NADH LF (GAUZE/BANDAGES/DRESSINGS) ×1 IMPLANT
DRSG PAD ABDOMINAL 8X10 ST (GAUZE/BANDAGES/DRESSINGS) ×2 IMPLANT
ELECT REM PT RETURN 9FT ADLT (ELECTROSURGICAL)
ELECTRODE REM PT RTRN 9FT ADLT (ELECTROSURGICAL) IMPLANT
GAUZE SPONGE 4X4 12PLY STRL (GAUZE/BANDAGES/DRESSINGS) ×2 IMPLANT
GAUZE XEROFORM 1X8 LF (GAUZE/BANDAGES/DRESSINGS) ×2 IMPLANT
GLOVE BIO SURGEON STRL SZ8 (GLOVE) ×2 IMPLANT
GLOVE BIOGEL PI IND STRL 8 (GLOVE) ×1 IMPLANT
GLOVE BIOGEL PI INDICATOR 8 (GLOVE) ×1
GOWN STRL REUS W/ TWL LRG LVL3 (GOWN DISPOSABLE) ×2 IMPLANT
GOWN STRL REUS W/TWL LRG LVL3 (GOWN DISPOSABLE) ×4
KIT BASIN OR (CUSTOM PROCEDURE TRAY) ×2 IMPLANT
KIT TURNOVER KIT B (KITS) ×2 IMPLANT
NDL PRECISIONGLIDE 27X1.5 (NEEDLE) ×1 IMPLANT
NEEDLE PRECISIONGLIDE 27X1.5 (NEEDLE) ×2 IMPLANT
NS IRRIG 1000ML POUR BTL (IV SOLUTION) ×2 IMPLANT
PACK ORTHO EXTREMITY (CUSTOM PROCEDURE TRAY) ×2 IMPLANT
PAD ARMBOARD 7.5X6 YLW CONV (MISCELLANEOUS) ×4 IMPLANT
PAD CAST 4YDX4 CTTN HI CHSV (CAST SUPPLIES) ×1 IMPLANT
PADDING CAST COTTON 4X4 STRL (CAST SUPPLIES) ×2
SOL PREP POV-IOD 4OZ 10% (MISCELLANEOUS) ×2 IMPLANT
STAPLER VISISTAT 35W (STAPLE) IMPLANT
SUT PROLENE 4 0 PS 2 18 (SUTURE) IMPLANT
SUT VIC AB 3-0 PS2 18 (SUTURE) IMPLANT
SUT VICRYL 4-0 PS2 18IN ABS (SUTURE) IMPLANT
SYR CONTROL 10ML LL (SYRINGE) ×2 IMPLANT
TOWEL GREEN STERILE (TOWEL DISPOSABLE) ×2 IMPLANT
TOWEL GREEN STERILE FF (TOWEL DISPOSABLE) ×2 IMPLANT
TUBE CONNECTING 12X1/4 (SUCTIONS) ×2 IMPLANT
YANKAUER SUCT BULB TIP NO VENT (SUCTIONS) IMPLANT

## 2021-12-03 NOTE — Op Note (Addendum)
   OPERATIVE REPORT Patient name: Micheal Walls MRN: [id number] DOB: 1936-01-04  DOS:  12/03/21  Preop Dx:Left second digit osteomyelties  Postop Dx: same  Procedure:  1. Left second digit amptuation   Surgeon: Lorenda Peck, DPM  Anesthesia: 50-50 mixture of 2% lidocaine plain with 0.5% Marcaine plain totaling 10 infiltrated in the patient's right lower extremity  Hemostasis: No TQ necessary   EBL: <5 mL Materials: none Injectables: as above Pathology: digit for pathology and swab for culture.   Condition: The patient tolerated the procedure and anesthesia well. No complications noted or reported   Justification for procedure: The patient is a 86 y.o. male who presents today for surgical treatment of left second digit osteomyelitis All conservative modalities of been unsuccessful in providing any sort of satisfactory alleviation of symptoms with the patient. The patient was told benefits as well as possible side effects of the surgery. The patient consented for surgical correction. The patient consent form was reviewed. All patient questions were answered. No guarantees were expressed or implied. The patient and the surgeon both signed the patient consent form with the witness present and placed in the patient's chart.   Procedure in Detail: The patient was brought to the operating room, placed in the operating table in the supine position at which time an aseptic scrub and drape were performed about the patient's respective lower extremity after anesthesia was induced as described above. Attention was then directed to the surgical area where procedure number one commenced.  Procedure #1:   Attention was directed to the left second digit were an incision was preformed encircling the base of the toe. Incision was made down to bone and digit was amputated at the level of the metatarsophalangeal join. After the toe was disarticulated it was passed off to the back table to be sent  to pathology for further evaluation. The extensor and flexor tendons were identified and resected as far proximally as possible. Any necrotic tissue was removed to healthy bleeding tissue. The metatarsal head was identified and noted to be hard. The area was irrigated copiously sterile saline and residual bone was sent to microbiology. Any bleeders noted were cauterized as necessary. The skin was re-approximated utilizing 3-0 nylon suture.    Dry sterile compressive dressings were then applied to all previously mentioned incision sites about the patient's lower extremity. The patient was then transferred from the operating room to the recovery room having tolerated the procedure and anesthesia well. All vital signs are stable. After a brief stay in the recovery room the patient was readmitted to the floor.    Disposition:  Patient is ok for discharge from podiatry standpoint. He may weight bear on heel. Dressing to remain intact until follow-up in clinic in about one week.    Lorenda Peck, DPM [facility]  Dr. Lorenda Peck, DPM    [address] [place], [place] [address]                Office [phone]  Fax [phone]

## 2021-12-03 NOTE — Anesthesia Preprocedure Evaluation (Addendum)
Anesthesia Evaluation  Patient identified by MRN, date of birth, ID band Patient awake    Reviewed: Allergy & Precautions, NPO status , Patient's Chart, lab work & pertinent test results  History of Anesthesia Complications Negative for: history of anesthetic complications  Airway Mallampati: I  TM Distance: >3 FB Neck ROM: Full    Dental  (+) Chipped, Missing, Dental Advisory Given   Pulmonary sleep apnea and Continuous Positive Airway Pressure Ventilation , COPD, former smoker,    breath sounds clear to auscultation       Cardiovascular hypertension, Pt. on medications (-) angina+ Peripheral Vascular Disease and +CHF (diastolic)   Rhythm:Regular Rate:Normal  '22 ECHO: EF 55-60%. The LV has normal function. The left ventricle has no regional wall motion abnormalities. There is moderate LVH. No significant valvular abnormalities, trivial pericardial effusion   Neuro/Psych CVA (L sided weakness), Residual Symptoms    GI/Hepatic Neg liver ROS, GERD  Medicated and Controlled,  Endo/Other  diabetes (Ozempic injection on Wed, glu 99), Well ControlledHypothyroidism Morbid obesity  Renal/GU Renal InsufficiencyRenal disease     Musculoskeletal   Abdominal (+) + obese,   Peds  Hematology  (+) Blood dyscrasia (Hb 9.9), anemia ,   Anesthesia Other Findings   Reproductive/Obstetrics                            Anesthesia Physical Anesthesia Plan  ASA: 3  Anesthesia Plan: MAC   Post-op Pain Management: Ofirmev IV (intra-op)*   Induction: Intravenous  PONV Risk Score and Plan: 1 and Ondansetron  Airway Management Planned: Natural Airway and Simple Face Mask  Additional Equipment: None  Intra-op Plan:   Post-operative Plan:   Informed Consent: I have reviewed the patients History and Physical, chart, labs and discussed the procedure including the risks, benefits and alternatives for the  proposed anesthesia with the patient or authorized representative who has indicated his/her understanding and acceptance.     Dental advisory given  Plan Discussed with: CRNA and Surgeon  Anesthesia Plan Comments: (Plan routine monitors, MAC with local by surgical team)       Anesthesia Quick Evaluation

## 2021-12-03 NOTE — Consult Note (Signed)
Subjective:  Patient ID: Micheal Walls, male    DOB: 18-Oct-1935,  MRN: [id number]  Patient with past medical history of PVD CHF, HLD, DM Type2, CKD, HTN, s/p right BKA and left toe amputation seen at beside today for worsening second toe wound. Was seen in clinic by Dr. Lilian Kapur and advised to come to the hospital for concern of osteomyelitis and amputation of the right second digit.  Relates he has no pain currently and resting comfortable. No n/v/f/c.  Past Medical History:  Diagnosis Date   Acute metabolic encephalopathy 01/16/2020   Amputated below knee [name])    right   Anemia    Aspiration pneumonia (HCC) 11/02/2019   Diastolic heart failure (HCC)    Diverticulosis    DM (diabetes mellitus) (HCC)    Gout    Hyperlipemia    OSA on CPAP    RBBB    Sepsis (HCC) 11/02/2019   Systemic hypertension      Past Surgical History:  Procedure Laterality Date   ABDOMINAL AORTOGRAM W/LOWER EXTREMITY Left 08/11/2020   Procedure: ABDOMINAL AORTOGRAM W/LOWER EXTREMITY;  Surgeon: Nada Libman, MD;  Location: [facility] INVASIVE CV LAB;  Service: Cardiovascular;  Laterality: Left;   ABDOMINAL AORTOGRAM W/LOWER EXTREMITY N/A 02/26/2021   Procedure: ABDOMINAL AORTOGRAM W/LOWER EXTREMITY;  Surgeon: Leonie Douglas, MD;  Location: [facility] INVASIVE CV LAB;  Service: Cardiovascular;  Laterality: N/A;   AMPUTATION Left 09/14/2020   Procedure: AMPUTATION LEFT GREAT TOE;  Surgeon: Edwin Cap, DPM;  Location: [facility] OR;  Service: Podiatry;  Laterality: Left;   LEG AMPUTATION BELOW KNEE     right   NM MYOCAR PERF WALL MOTION  09/18/2009   no ischemia   PERIPHERAL VASCULAR BALLOON ANGIOPLASTY Left 08/11/2020   Procedure: PERIPHERAL VASCULAR BALLOON ANGIOPLASTY;  Surgeon: Nada Libman, MD;  Location: [facility] INVASIVE CV LAB;  Service: Cardiovascular;  Laterality: Left;  AT   PERIPHERAL VASCULAR INTERVENTION Left 02/26/2021   Procedure: PERIPHERAL VASCULAR INTERVENTION;  Surgeon: Leonie Douglas, MD;  Location: [facility]  INVASIVE CV LAB;  Service: Cardiovascular;  Laterality: Left;       Latest Ref Rng & Units 12/03/2021    3:03 AM 12/02/2021    7:22 PM 11/17/2021   10:40 AM  CBC  WBC 4.0 - 10.5 K/uL 5.5  6.3  5.9   Hemoglobin 13.0 - 17.0 g/dL 9.9  9.6  56.2   Hematocrit 39.0 - 52.0 % 29.2  29.5  30.9   Platelets 150 - 400 K/uL 212  229  194        Latest Ref Rng & Units 12/03/2021    3:03 AM 12/02/2021    7:22 PM 11/17/2021   10:40 AM  BMP  Glucose 70 - 99 mg/dL 130  865  784   BUN 8 - 23 mg/dL 67  68  56   Creatinine 0.61 - 1.24 mg/dL 6.96  2.95  2.84   BUN/Creat Ratio 10 - 24   31   Sodium 135 - 145 mmol/L 140  135  137   Potassium 3.5 - 5.1 mmol/L 4.0  4.1  3.7   Chloride 98 - 111 mmol/L 103  100  100   CO2 22 - 32 mmol/L 27  25  22    Calcium 8.9 - 10.3 mg/dL 9.7  9.5  9.6      Objective:   Vitals:   12/03/21 0556 12/03/21 0845  BP: (!) 154/72 (!) 133/57  Pulse: 73 74  Resp: 18 19  Temp:  98 F (36.7 C)  SpO2: 97% 98%    General:AA&O x 3. Normal mood and affect   Vascular: DP and PT pulses 2/4 bilateral. Brisk capillary refill to all digits. Pedal hair present   Neruological. Epicritic sensation grossly intact.   Derm: Left second digit wound with exposed proximal phalanx and necrosis around wound. Erythema and edema present. No purulence noted.   MSK: MMT 5/5 in dorsiflexion, plantar flexion, inversion and eversion. Normal joint ROM without pain or crepitus.     IMPRESSION: 1. Amputation of the first MTP joint. No acute fracture or dislocation. 2. No radiographic findings of acute osteomyelitis. 3. Soft tissue swelling of the forefoot and diffuse subcutaneous edema.  Assessment & Plan:  Patient was evaluated and treated and all questions answered.  DX: Left second digit osteomyelitis Wound care: betadine, DSD  Antibiotics: Per primary  DME: post-op shoe  Discussed with patient diagnosis and treatment options.  Imaging reviewed. No acute erosions of bone but  clinically bone exposed and concern for osteomyelitis.  Discussed with patient and Dr. Lilian Kapur plan and in agreement to proceed with left second toe amputation.  ABIs preformed earlier show no change from previous but does have monophasic flow and will likely be a long healing process for him. Expressed understanding.   Patient in agreement with plan and all questions answered.  NPO for surgery this evening.   Louann Sjogren, DPM  Accessible via secure chat for questions or concerns.

## 2021-12-04 ENCOUNTER — Encounter (HOSPITAL_COMMUNITY): Payer: Self-pay | Admitting: Podiatry

## 2021-12-04 ENCOUNTER — Inpatient Hospital Stay (HOSPITAL_COMMUNITY): Payer: Medicare Other

## 2021-12-04 DIAGNOSIS — M869 Osteomyelitis, unspecified: Secondary | ICD-10-CM | POA: Diagnosis not present

## 2021-12-04 LAB — GLUCOSE, CAPILLARY
Glucose-Capillary: 99 mg/dL (ref 70–99)
Glucose-Capillary: 105 mg/dL — ABNORMAL HIGH (ref 70–99)

## 2021-12-04 MED ORDER — FERROUS SULFATE 325 (65 FE) MG PO TABS: 325.0000 mg | ORAL_TABLET | Freq: Two times a day (BID) | ORAL | 3 refills | Status: AC

## 2021-12-04 MED ORDER — HYDROCODONE-ACETAMINOPHEN 5-325 MG PO TABS: 1.0000 | ORAL_TABLET | ORAL | 0 refills | Status: DC | PRN

## 2021-12-04 NOTE — Discharge Summary (Signed)
Physician Discharge Summary  Micheal Walls TFT:[id number] DOB: 05-31-1936 DOA: 12/02/2021  PCP: [name], [name]  Admit date: 12/02/2021 Discharge date: 12/04/2021  Time spent: 37 minutes  Recommendations for Outpatient Follow-up:  Requires outpatient follow-up with Dr. Ralene Cork for podiatry follow-up after amputation of left second toe Recommend Chem-7, CBC in about 1 week Keep dressing in place until time of follow-up Oxycodone called into pharmacy for patient no changes otherwise to medications  Discharge Diagnoses:  MAIN problem for hospitalization   Cellulitis osteomyelitis of second toe with underlying PAD DM TY 2 with good control Prior CVA nonambulatory since 2002  CKD 3B  Please see below for itemized issues addressed in HOpsital- refer to other progress notes for clarity if needed  Discharge Condition: Improved  Diet recommendation: Diabetic  Filed Weights   12/02/21 1737  Weight: 110.1 kg    History of present illness:  86 year old.male lives with wife Prior insulin-dependent diabetic diagnosed in 1984-off insulin for the past 3 years because of hypoglycemia per patient report PAD prior left anterior tibial angioplasty 08/11/2020 with Dr. Hortencia Conradi great toe toe amputation 09/2020 Right BKA in the year 2000 ambulates with prosthesis walker and wheelchair,  CKD 3B OSA/CPAP Prior CVA in 2002 previously on aspirin HTN HFpEF EF 55-60% 07/2020  chronic microcytic anemia BPH    direct admitted from podiatry office-had an infection like second toe for about 2 weeks started draining-ABIs ordered on left lower extremities   Initial work-up: BUNs/creatinine slightly above baseline 56/1.7 at 68/2.1 AST ALT 43/48 WBC 6.3 lactic acid 0.9, CRP 4.2 ESR 78 INR 1.0 Blood cultures obtained A1c this admit 5.9   Underwent surgery 6/23 with Dr. Celine Ahr Course:  Infected left second toe?  Osteomyelitis underlying PAD status post angioplasty 08/11/2020 left  anterior tibial Right-sided BKA in the past Underwent surgery with amputation of left second toe by Dr. Ralene Cork on 6/23-pain is controlled will prescribe short course of oxycodone ABIs showed noncompressible veins in left lower extremity  DM TY 2 -A1c this admission 5.9 indicating good control Uses freestyle libre at home Home regimen does not seem to include any insulin uses Ozempic once a week CBG coverage was performed with sliding scale sugars during hospitalization pretty well controlled 90s to 100s Prior CVA 2002 previously on aspirin Aspirin held at this time secondary modification including Crestor 20 to continue HFpEF last EF normal 07/2020 Continue Cardizem 240, Lasix 40 twice daily CKD 3B Labs in a.m. avoid nephrotoxins as best possible Outpatient possibly need referral to nephrology if not already done BPH Continue Flomax 0.4 Gout Hypothyroid   Discharge Exam: Vitals:   12/04/21 1201 12/04/21 1320  BP: 140/63 (!) 125/49  Pulse: 86 88  Resp: 18 18  Temp: 98.2 F (36.8 C) 98 F (36.7 C)  SpO2: 99% 98%    Subj on day of d/c   Awake coherent was asleep but awakens readily and able to sit up in bed Tells me he does not walk and has not since 2002 since his stroke is quite mobile and can transfer however No pain fever chills  General Exam on discharge  EOMI NCAT no focal deficit looks younger than 86 age no icterus no pallor Chest is clear no rales rhonchi wheezes Abdomen is soft nontender no rebound no guarding Right BKA noted Reinforce dressing on left side  Discharge Instructions   Discharge Instructions     Diet - low sodium heart healthy   Complete by: As directed  Discharge instructions   Complete by: As directed    Please follow-up with Dr. Ralene Cork in the outpatient setting for further management of your wound and for further instructions keep the dressing in place as per their instructions We will prescribe pain control with oxycodone You will  need follow-up labs in about a week   Increase activity slowly   Complete by: As directed    Leave dressing on - Keep it clean, dry, and intact until clinic visit   Complete by: As directed       Allergies as of 12/04/2021       Reactions   Vancomycin Rash        Medication List     STOP taking these medications    Refresh Plus 0.5 % Soln Generic drug: Carboxymethylcellulose Sod PF       TAKE these medications    allopurinol 100 MG tablet Commonly known as: ZYLOPRIM Take 100 mg by mouth 2 (two) times daily.   aspirin EC 81 MG tablet Take 81 mg by mouth daily. Swallow whole.   colchicine 0.6 MG tablet Take 0.6 mg by mouth once a week.   diltiazem 240 MG 24 hr capsule Commonly known as: CARDIZEM CD Take 1 capsule (240 mg total) by mouth daily.   dorzolamide 2 % ophthalmic solution Commonly known as: TRUSOPT Place 1 drop into both eyes 2 (two) times daily.   ferrous sulfate 325 (65 FE) MG tablet Take 1 tablet (325 mg total) by mouth 2 (two) times daily with a meal. What changed: how much to take   FreeStyle Libre 14 Day Sensor Misc Use as directed to check blood sugars   FreeStyle Precision Neo Test test strip Generic drug: glucose blood Use as instructed   furosemide 40 MG tablet Commonly known as: LASIX Take 1 tablet in the morning and one in the evening What changed:  how much to take how to take this when to take this additional instructions   gabapentin 100 MG capsule Commonly known as: NEURONTIN TAKE TWO CAPSULES BY MOUTH EVERY MORNING and TAKE ONE CAPSULE BY MOUTH AT NOON and TAKE ONE CAPSULE BY MOUTH EVERY EVENING What changed:  how much to take how to take this when to take this additional instructions   HYDROcodone-acetaminophen 5-325 MG tablet Commonly known as: NORCO/VICODIN Take 1-2 tablets by mouth every 4 (four) hours as needed for moderate pain.   ketotifen 0.025 % ophthalmic solution Commonly known as: ZADITOR Place 1 drop  into both eyes 2 (two) times daily.   latanoprost 0.005 % ophthalmic solution Commonly known as: XALATAN Place 1 drop into both eyes at bedtime.   Ozempic ([name]) 2 MG/1.5ML Sopn Generic drug: Semaglutide ([name]) Inject 1 mg into the skin once a week.   pantoprazole 40 MG tablet Commonly known as: PROTONIX Take 1 tablet (40 mg total) by mouth daily.   PreserVision AREDS Caps Take 2 capsules by mouth daily.   multivitamin with minerals tablet Take 1 tablet by mouth daily.   Refresh 1.4-0.6 % Soln Generic drug: Polyvinyl Alcohol-Povidone PF Place 1 drop into both eyes 2 (two) times daily.   rosuvastatin 20 MG tablet Commonly known as: CRESTOR Take 1 tablet (20 mg total) by mouth daily.   senna-docusate 8.6-50 MG tablet Commonly known as: Senokot-S Take 1 tablet by mouth 2 (two) times daily between meals as needed for mild constipation or moderate constipation. What changed:  when to take this reasons to take this  Synthroid 25 MCG tablet Generic drug: levothyroxine Take 1 tablet by mouth Monday - Friday What changed:  how much to take how to take this when to take this additional instructions   tamsulosin 0.4 MG Caps capsule Commonly known as: FLOMAX Take 0.4 mg by mouth daily.               Discharge Care Instructions  (From admission, onward)           Start     Ordered   12/04/21 0000  Leave dressing on - Keep it clean, dry, and intact until clinic visit        12/04/21 1430           Allergies  Allergen Reactions   Vancomycin Rash      The results of significant diagnostics from this hospitalization (including imaging, microbiology, ancillary and laboratory) are listed below for reference.    Significant Diagnostic Studies: DG Foot Complete Left  Result Date: 12/04/2021 CLINICAL DATA:  Osteomyelitis EXAM: LEFT FOOT - COMPLETE 3+ VIEW COMPARISON:  10/21/2020 FINDINGS: Interval digital amputation of the left second digit.  Pre-existing great toe digital amputation. Osteopenia. No bony erosion or sclerosis to suggest osteomyelitis. Diffuse soft tissue edema about the foot and ankle. Vascular calcinosis. IMPRESSION: 1. Interval digital amputation of the left second digit. Pre-existing great toe digital amputation. 2. No bony erosion or sclerosis to suggest osteomyelitis. MRI is the test of choice to assess for bone marrow edema and osteomyelitis if clinically suspected. 3. Diffuse soft tissue edema. Electronically Signed   By: Jearld Lesch M.D.   On: 12/04/2021 13:47   VAS [place] ABI WITH/WO TBI  Result Date: 12/03/2021  LOWER EXTREMITY DOPPLER STUDY Patient Name:  GUSTER BARENTINE  Date of Exam:   12/03/2021 Medical Rec #: [id number]          Accession #:    [id number] Date of Birth: [birth year]          Patient Gender: M Patient Age:   63 years Exam Location:  [facility] Procedure:      VAS [place] ABI WITH/WO TBI Referring Phys: Orland Mustard --------------------------------------------------------------------------------  Indications: Ulceration, and right BKA and left great toe amputation. High Risk Factors: Hypertension, hyperlipidemia, Diabetes, no history of                    smoking, prior CVA. Other Factors: PAD.  Vascular Interventions: Right BKA, left great toe amputation. Limitations: Today's exam was limited due to bandages , an open wound and left              second toe bandage/wound. Comparison Study: 02/18/21 Left DPA non-compressible, left PTA not audible Performing Technologist: Magdalene River BS RVT  Examination Guidelines: A complete evaluation includes at minimum, Doppler waveform signals and systolic blood pressure reading at the level of bilateral brachial, anterior tibial, and posterior tibial arteries, when vessel segments are accessible. Bilateral testing is considered an integral part of a complete examination. Photoelectric Plethysmograph (PPG) waveforms and toe systolic pressure readings are included as  required and additional duplex testing as needed. Limited examinations for reoccurring indications may be performed as noted.  ABI Findings: +--------+------------------+-----+---------+--------+ Right   Rt Pressure (mmHg)IndexWaveform Comment  +--------+------------------+-----+---------+--------+ [id number]                    triphasic         +--------+------------------+-----+---------+--------+ +--------+------------------+-----+----------+-------+ Left    Lt Pressure (mmHg)IndexWaveform  Comment +--------+------------------+-----+----------+-------+ [id number]  triphasic         +--------+------------------+-----+----------+-------+ PTA     255               1.51 monophasic        +--------+------------------+-----+----------+-------+ DP      255               1.51 monophasic        +--------+------------------+-----+----------+-------+ +-------+-----------+-----------+------------+------------+ ABI/TBIToday's ABIToday's TBIPrevious ABIPrevious TBI +-------+-----------+-----------+------------+------------+ Right  BKA                   BKA                      +-------+-----------+-----------+------------+------------+ Left   Clyde                    Levan                       +-------+-----------+-----------+------------+------------+  Right BKA. Left ABIs appear essentially unchanged.  Summary: Left: Resting left ankle-brachial index indicates noncompressible left lower extremity arteries. *See table(s) above for measurements and observations.  Electronically signed by Sherald Hess MD on 12/03/2021 at 11:22:29 AM.    Final     Microbiology: Recent Results (from the past 240 hour(s))  Culture, blood (Routine X 2) w Reflex to ID Panel     Status: None (Preliminary result)   Collection Time: 12/02/21  7:28 PM   Specimen: BLOOD  Result Value Ref Range Status   Specimen Description BLOOD RIGHT ANTECUBITAL  Final   Special Requests    Final    BOTTLES DRAWN AEROBIC AND ANAEROBIC Blood Culture adequate volume   Culture   Final    NO GROWTH 2 DAYS Performed at [facility], [address], [place], [place] [address]    Report Status PENDING  Incomplete  Culture, blood (Routine X 2) w Reflex to ID Panel     Status: None (Preliminary result)   Collection Time: 12/02/21  7:32 PM   Specimen: BLOOD  Result Value Ref Range Status   Specimen Description BLOOD LEFT ANTECUBITAL  Final   Special Requests   Final    BOTTLES DRAWN AEROBIC ONLY Blood Culture adequate volume   Culture   Final    NO GROWTH 2 DAYS Performed at [facility], [address], [place], [place] [address]    Report Status PENDING  Incomplete  Surgical pcr screen     Status: None   Collection Time: 12/03/21  7:03 AM   Specimen: Nasal Mucosa; Nasal Swab  Result Value Ref Range Status   MRSA, PCR NEGATIVE NEGATIVE Final   Staphylococcus aureus NEGATIVE NEGATIVE Final    Comment: (NOTE) The Xpert SA Assay (FDA approved for NASAL specimens in patients 22 years of age and older), is one component of a comprehensive surveillance program. It is not intended to diagnose infection nor to guide or monitor treatment. Performed at [facility], [address], [place], [place] [address]   Aerobic/Anaerobic Culture w Gram Stain (surgical/deep wound)     Status: None (Preliminary result)   Collection Time: 12/03/21  7:17 PM   Specimen: PATH Soft tissue  Result Value Ref Range Status   Specimen Description WOUND  Final   Special Requests LEFT SECOND DIGIT  Final   Gram Stain NO WBC SEEN NO ORGANISMS SEEN   Final   Culture   Final    NO  GROWTH < 12 HOURS Performed at [facility], [address] [place], [place] [address]    Report Status PENDING  Incomplete     Labs: Basic Metabolic Panel: Recent Labs  Lab 12/02/21 1922 12/03/21 0303  NA 135 140  K 4.1 4.0  CL 100 103  CO2 25 27  GLUCOSE 112* 106*  BUN 68* 67*   CREATININE 2.10* 1.96*  CALCIUM 9.5 9.7   Liver Function Tests: Recent Labs  Lab 12/02/21 1922  AST 43*  ALT 48*  ALKPHOS 79  BILITOT 0.4  PROT 6.9  ALBUMIN 3.0*   No results for input(s): "LIPASE", "AMYLASE" in the last 168 hours. No results for input(s): "AMMONIA" in the last 168 hours. CBC: Recent Labs  Lab 12/02/21 1922 12/03/21 0303  WBC 6.3 5.5  NEUTROABS 3.9  --   HGB 9.6* 9.9*  HCT 29.5* 29.2*  MCV 88.1 86.9  PLT 229 212   Cardiac Enzymes: No results for input(s): "CKTOTAL", "CKMB", "CKMBINDEX", "TROPONINI" in the last 168 hours. BNP: BNP (last 3 results) No results for input(s): "BNP" in the last 8760 hours.  ProBNP (last 3 results) No results for input(s): "PROBNP" in the last 8760 hours.  CBG: Recent Labs  Lab 12/03/21 1830 12/03/21 1940 12/03/21 2122 12/04/21 0831 12/04/21 1140  GLUCAP 99 90 100* 99 105*       Signed:  Rhetta Mura MD   [facility] 12/04/2021, 2:30 PM

## 2021-12-06 ENCOUNTER — Telehealth: Payer: Self-pay | Admitting: *Deleted

## 2021-12-06 ENCOUNTER — Telehealth: Payer: Self-pay

## 2021-12-06 ENCOUNTER — Other Ambulatory Visit: Payer: Self-pay | Admitting: Podiatry

## 2021-12-06 LAB — BLOOD CULTURE ID PANEL (REFLEXED) - BCID2

## 2021-12-06 MED ORDER — OXYCODONE HCL 5 MG PO TABS: 5.0000 mg | ORAL_TABLET | ORAL | 0 refills | Status: DC | PRN

## 2021-12-07 LAB — CULTURE, BLOOD (ROUTINE X 2)
Culture: NO GROWTH
Special Requests: ADEQUATE
Special Requests: ADEQUATE

## 2021-12-07 LAB — SURGICAL PATHOLOGY

## 2021-12-08 ENCOUNTER — Ambulatory Visit: Payer: Medicare Other | Admitting: Podiatry

## 2021-12-08 LAB — AEROBIC/ANAEROBIC CULTURE W GRAM STAIN (SURGICAL/DEEP WOUND): Gram Stain: NONE SEEN

## 2021-12-09 ENCOUNTER — Ambulatory Visit (INDEPENDENT_AMBULATORY_CARE_PROVIDER_SITE_OTHER): Payer: Medicare Other | Admitting: Podiatry

## 2021-12-09 DIAGNOSIS — Z89422 Acquired absence of other left toe(s): Secondary | ICD-10-CM

## 2021-12-09 NOTE — Patient Instructions (Signed)
Change the dressing every 3 days, put some betadine along the stitches and dress with gauze and wrap

## 2021-12-09 NOTE — Progress Notes (Signed)
  Subjective:  Patient ID: Micheal Walls, male    DOB: 08-21-1935,  MRN: [id number]  Chief Complaint  Patient presents with   Routine Post Op     hospital surgery follow up/ amputation of left foot    DOS: 12/03/1926 Procedure: Amputation left second toe  86 y.o. male returns for post-op check.  Overall doing well not having any pain now  Review of Systems: Negative except as noted in the HPI. Denies N/V/F/Ch.   Objective:  There were no vitals filed for this visit. There is no height or weight on file to calculate BMI. Constitutional Well developed. Well nourished.  Vascular Foot warm and well perfused. Capillary refill normal to all digits.  Calf is soft and supple, no posterior calf or knee pain, negative Homans' sign  Neurologic Normal speech. Oriented to person, place, and time. Epicritic sensation to light touch grossly absent bilaterally.  Dermatologic Skin healing well without signs of infection. Skin edges well coapted without signs of infection.  Orthopedic: He has no tenderness to palpation noted about the surgical site.   Pathology results showed osteomyelitis with no residual infection noted at margin  Assessment:   1. Status post amputation of lesser toe of left foot ([place])    Plan:  Patient was evaluated and treated and all questions answered.  S/p foot surgery left -Progressing as expected post-operatively.  Appears to be healing well no signs of infection -Can continue WBAT in the surgical shoe -Foot was redressed with Betadine along the incision.  He will change this every 3 days and I will see him back in 2 weeks for suture removal  Return in about 2 weeks (around 12/23/2021) for post op (no x-rays), suture removal.

## 2021-12-10 DIAGNOSIS — Z794 Long term (current) use of insulin: Secondary | ICD-10-CM | POA: Diagnosis not present

## 2021-12-10 DIAGNOSIS — N1831 Chronic kidney disease, stage 3a: Secondary | ICD-10-CM | POA: Diagnosis not present

## 2021-12-10 DIAGNOSIS — E1122 Type 2 diabetes mellitus with diabetic chronic kidney disease: Secondary | ICD-10-CM | POA: Diagnosis not present

## 2021-12-10 DIAGNOSIS — D649 Anemia, unspecified: Secondary | ICD-10-CM

## 2021-12-12 NOTE — Progress Notes (Signed)
[place] [phone]   Fax:([phone] [place] NOTE  Patient Care Team: [name], [name] as PCP - General ([place]) [name], [name], MD as PCP - Cardiology (Cardiology) Little, Claudette Stapler, RN as [place] Management Pearson, Sharyn Blitz, [facility] (Pharmacist)  CHIEF COMPLAINTS/PURPOSE OF CONSULTATION:  Normocytic anemia  HISTORY OF PRESENTING ILLNESS:  Micheal Walls 86 y.o. male with medical history significant for diabetes, hypertension, hyperlipidemia, obstructive sleep apnea on CPAP, peripheral artery disease, status post right lower extremity amputation and left toe amputation, stroke, gout and diastolic heart failure.  He is unaccompanied for this visit.  On review of the previous records, Micheal Walls has evidence of anemia over several years.  His most recent labs from 12/03/2021 showed his hemoglobin was 9.9, MCV 86.9.  On exam today, Micheal Walls reports he does have fatigue that has been chronic but he can complete most of his activities on his own.  He has a good appetite but endorses weight loss after starting on Ozempic injections through his PCP.  He denies any nausea, vomiting or abdominal pain.  His bowel habits are unchanged without any recurrent episodes of diarrhea or constipation.  He denies easy bruising or signs of active bleeding.  He recently underwent amputation of left second toe due to cellulitis/osteomyelitis with underlying PAD.  He denies any fevers, chills, night sweats, shortness of breath, chest pain or cough.  He has no other complaints.  Rest of the 10 point ROS is below.  MEDICAL HISTORY:  Past Medical History:  Diagnosis Date   Acute metabolic encephalopathy 12/14/812   Amputated below knee [facility])    right   Anemia    Aspiration pneumonia ([place]) 4/81/8563   Diastolic heart failure (HCC)    Diverticulosis    DM (diabetes mellitus) ([place])    Gout    Hyperlipemia    OSA on CPAP    RBBB     Sepsis ([place]) 11/02/2019   Systemic hypertension     SURGICAL HISTORY: Past Surgical History:  Procedure Laterality Date   ABDOMINAL AORTOGRAM W/LOWER EXTREMITY Left 08/11/2020   Procedure: ABDOMINAL AORTOGRAM W/LOWER EXTREMITY;  Surgeon: Serafina Mitchell, MD;  Location: [place] [facility];  Service: Cardiovascular;  Laterality: Left;   ABDOMINAL AORTOGRAM W/LOWER EXTREMITY N/A 02/26/2021   Procedure: ABDOMINAL AORTOGRAM W/LOWER EXTREMITY;  Surgeon: Cherre Robins, MD;  Location: [place] [facility];  Service: Cardiovascular;  Laterality: N/A;   AMPUTATION Left 09/14/2020   Procedure: AMPUTATION LEFT GREAT TOE;  Surgeon: [name], DPM;  Location: [place];  Service: Podiatry;  Laterality: Left;   AMPUTATION TOE Left 12/03/2021   Procedure: AMPUTATION TOE, LEFT second;  Surgeon: Lorenda Peck, DPM;  Location: [place];  Service: Podiatry;  Laterality: Left;   LEG AMPUTATION BELOW KNEE     right   NM MYOCAR PERF WALL MOTION  09/18/2009   no ischemia   PERIPHERAL VASCULAR BALLOON ANGIOPLASTY Left 08/11/2020   Procedure: PERIPHERAL VASCULAR BALLOON ANGIOPLASTY;  Surgeon: Serafina Mitchell, MD;  Location: [place] [facility];  Service: Cardiovascular;  Laterality: Left;  AT   PERIPHERAL VASCULAR INTERVENTION Left 02/26/2021   Procedure: PERIPHERAL VASCULAR INTERVENTION;  Surgeon: Cherre Robins, MD;  Location: [place] [facility];  Service: Cardiovascular;  Laterality: Left;    SOCIAL HISTORY: Social History   Socioeconomic History   Marital status: Married    Spouse name: Not on file   Number of children: Not on file  Years of education: Not on file   Highest education level: Not on file  Occupational History   Occupation: retired  Tobacco Use   Smoking status: Former    Types: Cigars    Quit date: 1997    Years since quitting: 26.5   Smokeless tobacco: Never  Vaping Use   Vaping Use: Former  Substance and Sexual Activity   Alcohol use: No    Alcohol/week: 0.0 standard drinks of  alcohol   Drug use: No   Sexual activity: Not Currently  Other Topics Concern   Not on file  Social History Narrative   Not on file   Social Determinants of Health   Financial Resource Strain: Low Risk  (05/20/2021)   Overall Financial Resource Strain (CARDIA)    Difficulty of Paying Living Expenses: Not hard at all  Food Insecurity: No Food Insecurity (05/20/2021)   Hunger Vital Sign    Worried About Running Out of Food in the Last Year: Never true    [place] in the Last Year: Never true  Transportation Needs: No Transportation Needs (05/20/2021)   PRAPARE - Hydrologist (Medical): No    Lack of Transportation (Non-Medical): No  Physical Activity: Inactive (05/20/2021)   Exercise Vital Sign    Days of Exercise per Week: 0 days    Minutes of Exercise per Session: 0 min  Stress: No Stress Concern Present (05/20/2021)   [place]    Feeling of Stress : Not at all  Social Connections: Not on file  Intimate Partner Violence: Unknown (05/15/2019)   Humiliation, Afraid, Rape, and Kick questionnaire    Fear of Current or Ex-Partner: No    Emotionally Abused: Not on file    Physically Abused: Not on file    Sexually Abused: Not on file    FAMILY HISTORY: Family History  Problem Relation Age of Onset   Diabetes Mother    Heart attack Father    Cancer Sister     ALLERGIES:  is allergic to vancomycin.  MEDICATIONS:  Current Outpatient Medications  Medication Sig Dispense Refill   allopurinol (ZYLOPRIM) 100 MG tablet Take 100 mg by mouth 2 (two) times daily.     aspirin EC 81 MG tablet Take 81 mg by mouth daily. Swallow whole.     colchicine 0.6 MG tablet Take 0.6 mg by mouth once a week.     Continuous Blood Gluc Sensor (FREESTYLE LIBRE 14 DAY SENSOR) MISC Use as directed to check blood sugars 6 each 1   diltiazem (CARDIZEM CD) 240 MG 24 hr capsule Take 1 capsule (240 mg total)  by mouth daily. 90 capsule 3   dorzolamide (TRUSOPT) 2 % ophthalmic solution Place 1 drop into both eyes 2 (two) times daily.     ferrous sulfate 325 (65 FE) MG tablet Take 1 tablet (325 mg total) by mouth 2 (two) times daily with a meal.  3   furosemide (LASIX) 40 MG tablet Take 1 tablet in the morning and one in the evening (Patient taking differently: Take 40 mg by mouth 2 (two) times daily.) 180 tablet 1   gabapentin (NEURONTIN) 100 MG capsule TAKE TWO CAPSULES BY MOUTH EVERY MORNING and TAKE ONE CAPSULE BY MOUTH AT NOON and TAKE ONE CAPSULE BY MOUTH EVERY EVENING (Patient taking differently: Take 100-200 mg by mouth See admin instructions. Take two capsules by mouth every morning and then take one capsule by  mouth at noon and one capsule by mouth every evening.) 360 capsule 1   glucose blood (FREESTYLE PRECISION NEO TEST) test strip Use as instructed 100 each 12   ketotifen (ZADITOR) 0.025 % ophthalmic solution Place 1 drop into both eyes 2 (two) times daily.     latanoprost (XALATAN) 0.005 % ophthalmic solution Place 1 drop into both eyes at bedtime.      Multiple Vitamins-Minerals (MULTIVITAMIN WITH MINERALS) tablet Take 1 tablet by mouth daily.     Multiple Vitamins-Minerals (PRESERVISION AREDS) CAPS Take 2 capsules by mouth daily.     oxyCODONE (ROXICODONE) 5 MG immediate release tablet Take 1 tablet (5 mg total) by mouth every 4 (four) hours as needed for severe pain. 30 tablet 0   pantoprazole (PROTONIX) 40 MG tablet Take 1 tablet (40 mg total) by mouth daily. 90 tablet 1   Polyvinyl Alcohol-Povidone PF (REFRESH) 1.4-0.6 % SOLN Place 1 drop into both eyes 2 (two) times daily. (Patient not taking: Reported on 12/03/2021)     rosuvastatin (CRESTOR) 20 MG tablet Take 1 tablet (20 mg total) by mouth daily. 90 tablet 3   Semaglutide, [name], (OZEMPIC, [name],) 2 MG/1.5ML SOPN Inject 1 mg into the skin once a week. 9 mL 1   senna-docusate (SENOKOT-S) 8.6-50 MG tablet Take 1 tablet by mouth 2  (two) times daily between meals as needed for mild constipation or moderate constipation. (Patient taking differently: Take 1 tablet by mouth 2 (two) times daily as needed (constipation).) 60 tablet 0   SYNTHROID 25 MCG tablet Take 1 tablet by mouth Monday - Friday (Patient taking differently: Take 25 mcg by mouth See admin instructions. Take 25 mcg by mouth Monday through Friday.) 60 tablet 2   tamsulosin (FLOMAX) 0.4 MG CAPS capsule Take 0.4 mg by mouth daily.     No current facility-administered medications for this visit.    REVIEW OF SYSTEMS:   Constitutional: ( - ) fevers, ( - )  chills , ( - ) night sweats Eyes: ( - ) blurriness of vision, ( - ) double vision, ( - ) watery eyes Ears, nose, mouth, throat, and face: ( - ) mucositis, ( - ) sore throat Respiratory: ( - ) cough, ( - ) dyspnea, ( - ) wheezes Cardiovascular: ( - ) palpitation, ( - ) chest discomfort, ( - ) lower extremity swelling Gastrointestinal:  ( - ) nausea, ( - ) heartburn, ( - ) change in bowel habits Skin: ( - ) abnormal skin rashes Lymphatics: ( - ) new lymphadenopathy, ( - ) easy bruising Neurological: ( - ) numbness, ( - ) tingling, ( - ) new weaknesses Behavioral/Psych: ( - ) mood change, ( - ) new changes  All other systems were reviewed with the patient and are negative.  PHYSICAL EXAMINATION: ECOG PERFORMANCE STATUS: 1 - Symptomatic but completely ambulatory  Vitals:   12/13/21 0817  BP: (!) 130/59  Pulse: 74  Resp: 18  Temp: 97.7 F (36.5 C)  SpO2: 99%   Filed Weights    GENERAL: well appearing male in NAD, patient was in wheelchair during visit.   SKIN: skin color, texture, turgor are normal, no rashes or significant lesions EYES: conjunctiva are pink and non-injected, sclera clear LUNGS: clear to auscultation and percussion with normal breathing effort HEART: regular rate & rhythm and no murmurs and no lower extremity edema PSYCH: alert & oriented x 3, fluent speech NEURO: no focal  motor/sensory deficits  LABORATORY DATA:  I have  reviewed the data as listed    Latest Ref Rng & Units 12/03/2021    3:03 AM 12/02/2021    7:22 PM 11/17/2021   10:40 AM  CBC  WBC 4.0 - 10.5 K/uL 5.5  6.3  5.9   Hemoglobin 13.0 - 17.0 g/dL 9.9  9.6  10.2   Hematocrit 39.0 - 52.0 % 29.2  29.5  30.9   Platelets 150 - 400 K/uL 212  229  194        Latest Ref Rng & Units 12/03/2021    3:03 AM 12/02/2021    7:22 PM 11/17/2021   10:40 AM  CMP  Glucose 70 - 99 mg/dL 106  112  133   BUN 8 - 23 mg/dL 67  68  56   Creatinine 0.61 - 1.24 mg/dL 1.96  2.10  1.78   Sodium 135 - 145 mmol/L 140  135  137   Potassium 3.5 - 5.1 mmol/L 4.0  4.1  3.7   Chloride 98 - 111 mmol/L 103  100  100   CO2 22 - 32 mmol/L _0 Calcium 8.9 - 10.3 mg/dL 9.7  9.5  9.6   Total Protein 6.5 - 8.1 g/dL  6.9    Total Bilirubin 0.3 - 1.2 mg/dL  0.4    Alkaline Phos 38 - 126 U/L  79    AST 15 - 41 U/L  43    ALT 0 - 44 U/L  48      RADIOGRAPHIC STUDIES: I have personally reviewed the radiological images as listed and agreed with the findings in the report. DG Foot Complete Left  Result Date: 12/04/2021 CLINICAL DATA:  Osteomyelitis EXAM: LEFT FOOT - COMPLETE 3+ VIEW COMPARISON:  10/21/2020 FINDINGS: Interval digital amputation of the left second digit. Pre-existing great toe digital amputation. Osteopenia. No bony erosion or sclerosis to suggest osteomyelitis. Diffuse soft tissue edema about the foot and ankle. Vascular calcinosis. IMPRESSION: 1. Interval digital amputation of the left second digit. Pre-existing great toe digital amputation. 2. No bony erosion or sclerosis to suggest osteomyelitis. MRI is the test of choice to assess for bone marrow edema and osteomyelitis if clinically suspected. 3. Diffuse soft tissue edema. Electronically Signed   By: Delanna Ahmadi M.D.   On: 12/04/2021 13:47   VAS [place] ABI WITH/WO TBI  Result Date: 12/03/2021  LOWER EXTREMITY DOPPLER STUDY Patient Name:  Micheal Walls  Date  of Exam:   12/03/2021 Medical Rec #: [id number]          Accession #:    [id number] Date of Birth: 03/15/1936          Patient Gender: M Patient Age:   47 years Exam Location:  [facility] Procedure:      VAS [place] ABI WITH/WO TBI Referring Phys: Orma Flaming --------------------------------------------------------------------------------  Indications: Ulceration, and right BKA and left great toe amputation. High Risk Factors: Hypertension, hyperlipidemia, Diabetes, no history of                    smoking, prior CVA. Other Factors: PAD.  Vascular Interventions: Right BKA, left great toe amputation. Limitations: Today's exam was limited due to bandages , an open wound and left              second toe bandage/wound. Comparison Study: 02/18/21 Left DPA non-compressible, left PTA not audible Performing Technologist: Bobetta Lime BS RVT  Examination Guidelines: A complete evaluation includes at minimum, Doppler  waveform signals and systolic blood pressure reading at the level of bilateral brachial, anterior tibial, and posterior tibial arteries, when vessel segments are accessible. Bilateral testing is considered an integral part of a complete examination. Photoelectric Plethysmograph (PPG) waveforms and toe systolic pressure readings are included as required and additional duplex testing as needed. Limited examinations for reoccurring indications may be performed as noted.  ABI Findings: +--------+------------------+-----+---------+--------+ Right   Rt Pressure (mmHg)IndexWaveform Comment  +--------+------------------+-----+---------+--------+ [id number]                    triphasic         +--------+------------------+-----+---------+--------+ +--------+------------------+-----+----------+-------+ Left    Lt Pressure (mmHg)IndexWaveform  Comment +--------+------------------+-----+----------+-------+ [id number]                    triphasic          +--------+------------------+-----+----------+-------+ PTA     255               1.51 monophasic        +--------+------------------+-----+----------+-------+ DP      255               1.51 monophasic        +--------+------------------+-----+----------+-------+ +-------+-----------+-----------+------------+------------+ ABI/TBIToday's ABIToday's TBIPrevious ABIPrevious TBI +-------+-----------+-----------+------------+------------+ Right  BKA                   BKA                      +-------+-----------+-----------+------------+------------+ Left   [place]                    [place]                       +-------+-----------+-----------+------------+------------+  Right BKA. Left ABIs appear essentially unchanged.  Summary: Left: Resting left ankle-brachial index indicates noncompressible left lower extremity arteries. *See table(s) above for measurements and observations.  Electronically signed by Monica Martinez MD on 12/03/2021 at 11:22:29 AM.    Final     ASSESSMENT & PLAN Micheal Walls is a 86 y.o. male who presents to the hematology clinic for initial evaluation for chronic normocytic anemia.  Patient has several chronic conditions including stage III chronic kidney disease that is most likely contributing to his anemia.  We will proceed with logic work-up to rule out other causes including nutritional deficiencies, paraproteinemia and bone marrow disorders.  #Chronic normocytic anemia: --Likely secondary to chronic kidney disease, stage III.  --Labs today to check CBC, CMP, vitamin B12, MMA, folate, ferritin, iron and TIBC, reticulocyte panel, erythropoietin, SPEP with IFE, serum free light chains, peripheral smear. --If anemia is confirmed to be due to chronic kidney disease, we will discuss EPO injections with his nephrologist, Dr. Corliss Parish.  --Consider bone marrow biopsy based on above workup. --RTC based on above workup   Orders Placed This Encounter   Procedures   CBC with Differential ([place] Only)    Standing Status:   Future    Number of Occurrences:   1    Standing Expiration Date:   12/13/2022   CMP ([place] only)    Standing Status:   Future    Number of Occurrences:   1    Standing Expiration Date:   12/13/2022   Ferritin    Standing Status:   Future    Number of Occurrences:   1    Standing Expiration Date:   12/13/2022  Iron and Iron Binding Capacity ([facility] only)    Standing Status:   Future    Number of Occurrences:   1    Standing Expiration Date:   12/13/2022   Retic Panel    Standing Status:   Future    Number of Occurrences:   1    Standing Expiration Date:   12/13/2022   Vitamin B12    Standing Status:   Future    Number of Occurrences:   1    Standing Expiration Date:   12/12/2022   Methylmalonic acid, serum    Standing Status:   Future    Number of Occurrences:   1    Standing Expiration Date:   12/12/2022   Folate, Serum    Standing Status:   Future    Number of Occurrences:   1    Standing Expiration Date:   12/12/2022   Multiple Myeloma Panel (SPEP&IFE w/QIG)    Standing Status:   Future    Number of Occurrences:   1    Standing Expiration Date:   12/12/2022   Kappa/lambda light chains    Standing Status:   Future    Number of Occurrences:   1    Standing Expiration Date:   12/12/2022   Erythropoietin    Standing Status:   Future    Number of Occurrences:   1    Standing Expiration Date:   12/13/2022   Technologist smear review    Standing Status:   Future    Number of Occurrences:   1    Standing Expiration Date:   12/14/2022    All questions were answered. The patient knows to call the clinic with any problems, questions or concerns.  I have spent a total of 60 minutes minutes of face-to-face and non-face-to-face time, preparing to see the patient, obtaining and/or reviewing separately obtained history, performing a medically appropriate examination, counseling and educating the patient,  ordering tests/procedures,  documenting clinical information in the electronic health record,  and care coordination.   Dede Query, PA-C Department of Hematology/Oncology [place] at [facility] Phone: [phone]

## 2021-12-13 ENCOUNTER — Other Ambulatory Visit: Payer: Self-pay

## 2021-12-13 ENCOUNTER — Inpatient Hospital Stay (HOSPITAL_BASED_OUTPATIENT_CLINIC_OR_DEPARTMENT_OTHER): Payer: Medicare Other | Admitting: Physician Assistant

## 2021-12-13 ENCOUNTER — Encounter: Payer: Self-pay | Admitting: Physician Assistant

## 2021-12-13 ENCOUNTER — Telehealth: Payer: Self-pay

## 2021-12-13 ENCOUNTER — Inpatient Hospital Stay: Payer: Medicare Other | Attending: Physician Assistant

## 2021-12-13 VITALS — BP 130/59 | HR 74 | Temp 97.7°F | Resp 18 | Ht 72.0 in

## 2021-12-13 DIAGNOSIS — D472 Monoclonal gammopathy: Secondary | ICD-10-CM | POA: Diagnosis not present

## 2021-12-13 DIAGNOSIS — D649 Anemia, unspecified: Secondary | ICD-10-CM | POA: Insufficient documentation

## 2021-12-13 DIAGNOSIS — I739 Peripheral vascular disease, unspecified: Secondary | ICD-10-CM | POA: Insufficient documentation

## 2021-12-13 DIAGNOSIS — Z87891 Personal history of nicotine dependence: Secondary | ICD-10-CM | POA: Diagnosis not present

## 2021-12-13 DIAGNOSIS — G4733 Obstructive sleep apnea (adult) (pediatric): Secondary | ICD-10-CM

## 2021-12-13 DIAGNOSIS — I129 Hypertensive chronic kidney disease with stage 1 through stage 4 chronic kidney disease, or unspecified chronic kidney disease: Secondary | ICD-10-CM

## 2021-12-13 DIAGNOSIS — Z89511 Acquired absence of right leg below knee: Secondary | ICD-10-CM

## 2021-12-13 DIAGNOSIS — Z8673 Personal history of transient ischemic attack (TIA), and cerebral infarction without residual deficits: Secondary | ICD-10-CM | POA: Insufficient documentation

## 2021-12-13 DIAGNOSIS — Z809 Family history of malignant neoplasm, unspecified: Secondary | ICD-10-CM | POA: Insufficient documentation

## 2021-12-13 DIAGNOSIS — N183 Chronic kidney disease, stage 3 unspecified: Secondary | ICD-10-CM

## 2021-12-13 DIAGNOSIS — Z89422 Acquired absence of other left toe(s): Secondary | ICD-10-CM | POA: Insufficient documentation

## 2021-12-13 DIAGNOSIS — I509 Heart failure, unspecified: Secondary | ICD-10-CM | POA: Insufficient documentation

## 2021-12-13 DIAGNOSIS — M109 Gout, unspecified: Secondary | ICD-10-CM | POA: Diagnosis not present

## 2021-12-13 DIAGNOSIS — E1122 Type 2 diabetes mellitus with diabetic chronic kidney disease: Secondary | ICD-10-CM | POA: Diagnosis not present

## 2021-12-13 DIAGNOSIS — E785 Hyperlipidemia, unspecified: Secondary | ICD-10-CM | POA: Diagnosis not present

## 2021-12-13 LAB — CBC WITH DIFFERENTIAL (CANCER CENTER ONLY)
Abs Immature Granulocytes: 0.01 10*3/uL (ref 0.00–0.07)
Basophils Absolute: 0 10*3/uL (ref 0.0–0.1)
Basophils Relative: 1 %
Eosinophils Absolute: 0.4 10*3/uL (ref 0.0–0.5)
Eosinophils Relative: 7 %
HCT: 31.5 % — ABNORMAL LOW (ref 39.0–52.0)
Hemoglobin: 10.5 g/dL — ABNORMAL LOW (ref 13.0–17.0)
Immature Granulocytes: 0 %
Lymphocytes Relative: 24 %
Lymphs Abs: 1.6 10*3/uL (ref 0.7–4.0)
MCH: 28.8 pg (ref 26.0–34.0)
MCHC: 33.3 g/dL (ref 30.0–36.0)
MCV: 86.3 fL (ref 80.0–100.0)
Monocytes Absolute: 0.5 10*3/uL (ref 0.1–1.0)
Monocytes Relative: 8 %
Neutro Abs: 3.8 10*3/uL (ref 1.7–7.7)
Neutrophils Relative %: 60 %
Platelet Count: 222 10*3/uL (ref 150–400)
RBC: 3.65 MIL/uL — ABNORMAL LOW (ref 4.22–5.81)
RDW: 15.1 % (ref 11.5–15.5)
WBC Count: 6.4 10*3/uL (ref 4.0–10.5)
nRBC: 0 % (ref 0.0–0.2)

## 2021-12-13 LAB — CMP (CANCER CENTER ONLY)
ALT: 44 U/L (ref 0–44)
AST: 31 U/L (ref 15–41)
Albumin: 3.8 g/dL (ref 3.5–5.0)
Alkaline Phosphatase: 94 U/L (ref 38–126)
Anion gap: 7 (ref 5–15)
BUN: 59 mg/dL — ABNORMAL HIGH (ref 8–23)
CO2: 27 mmol/L (ref 22–32)
Calcium: 10.6 mg/dL — ABNORMAL HIGH (ref 8.9–10.3)
Chloride: 103 mmol/L (ref 98–111)
Creatinine: 2.03 mg/dL — ABNORMAL HIGH (ref 0.61–1.24)
GFR, Estimated: 32 mL/min — ABNORMAL LOW (ref >60)
Glucose, Bld: 104 mg/dL — ABNORMAL HIGH (ref 70–99)
Potassium: 4.2 mmol/L (ref 3.5–5.1)
Sodium: 137 mmol/L (ref 135–145)
Total Bilirubin: 0.4 mg/dL (ref 0.3–1.2)
Total Protein: 8.1 g/dL (ref 6.5–8.1)

## 2021-12-13 LAB — IRON AND IRON BINDING CAPACITY (CC-WL,HP ONLY)
Iron: 68 ug/dL (ref 45–182)
Saturation Ratios: 25 % (ref 17.9–39.5)
TIBC: 277 ug/dL (ref 250–450)
UIBC: 209 ug/dL (ref 117–376)

## 2021-12-13 LAB — RETIC PANEL
Immature Retic Fract: 18.8 % — ABNORMAL HIGH (ref 2.3–15.9)
RBC.: 3.63 MIL/uL — ABNORMAL LOW (ref 4.22–5.81)
Retic Count, Absolute: 48.6 10*3/uL (ref 19.0–186.0)
Retic Ct Pct: 1.3 % (ref 0.4–3.1)
Reticulocyte Hemoglobin: 32.6 pg (ref >27.9)

## 2021-12-13 LAB — TECHNOLOGIST SMEAR REVIEW: Plt Morphology: ADEQUATE

## 2021-12-13 LAB — FERRITIN: Ferritin: 101 ng/mL (ref 24–336)

## 2021-12-13 LAB — FOLATE: Folate: 35 ng/mL (ref >5.9)

## 2021-12-13 LAB — VITAMIN B12: Vitamin B-12: 836 pg/mL (ref 180–914)

## 2021-12-13 NOTE — Chronic Care Management (AMB) (Signed)
Chronic Care Management Pharmacy Assistant   Name: Micheal Walls  MRN: [id number] DOB: 12/02/1935   Reason for Encounter: Disease State/ General  Recent office visits:  11-19-2021 Lynne Logan, RN (CCM)  11-17-2021 [name], [place]. Glucose= 133, BUN= 56, Creatinine= 1.78, eGFR= 37, BUN/Creatinine= 31. RBC= 3.50, Hemo= 10.2, Hema= 30.9. A1C= 5.9. Cholesterol= 96, HDL= 29. Cholesterol= 96, HDL= 29.  Recent consult visits:  12-13-2021 Cordelia Poche (Hematology/oncology). RBC= 3.65, Hemo= 10.5, HCT= 31.5. Immature Retic Fract= 18.8. STOP refresh drops.  12-09-2021 [name], DPM (Podiatry). 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   12-03-2021 Lorenda Peck, DPM (Podiatry). 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 START HYDROcodone-acetaminophen 5-325 mg Take 1-2 tablets by mouth every 4 (four) hours as needed.  12-02-2021 [name], DPM (Podiatry). Follow up visit for ulcer.  11-15-2021 [name], DPM (Podiatry). Post op visit for Left hallux amputation.  Hospital visits:  None in previous 6 months  Medications: Outpatient Encounter Medications as of 12/13/2021  Medication Sig Note   allopurinol (ZYLOPRIM) 100 MG tablet Take 100 mg by mouth 2 (two) times daily.    aspirin EC 81 MG tablet Take 81 mg by mouth daily. Swallow whole.    colchicine 0.6 MG tablet Take 0.6 mg by mouth once a week.    Continuous Blood Gluc Sensor (FREESTYLE LIBRE 14 DAY SENSOR) MISC Use as directed to check blood sugars    diltiazem (CARDIZEM CD) 240 MG 24 hr capsule Take 1 capsule (240 mg total) by mouth daily.    dorzolamide (TRUSOPT) 2 % ophthalmic solution Place 1 drop into both eyes 2 (two) times daily.    ferrous sulfate 325 (65 FE) MG tablet Take 1 tablet (325 mg total) by mouth 2 (two) times daily with a meal.    furosemide (LASIX) 40 MG tablet Take 1 tablet in the morning and one in the evening (Patient taking differently: Take 40 mg by mouth 2 (two) times  daily.)    gabapentin (NEURONTIN) 100 MG capsule TAKE TWO CAPSULES BY MOUTH EVERY MORNING and TAKE ONE CAPSULE BY MOUTH AT NOON and TAKE ONE CAPSULE BY MOUTH EVERY EVENING (Patient taking differently: Take 100-200 mg by mouth See admin instructions. Take two capsules by mouth every morning and then take one capsule by mouth at noon and one capsule by mouth every evening.)    glucose blood (FREESTYLE PRECISION NEO TEST) test strip Use as instructed    ketotifen (ZADITOR) 0.025 % ophthalmic solution Place 1 drop into both eyes 2 (two) times daily.    latanoprost (XALATAN) 0.005 % ophthalmic solution Place 1 drop into both eyes at bedtime.     Multiple Vitamins-Minerals (MULTIVITAMIN WITH MINERALS) tablet Take 1 tablet by mouth daily.    Multiple Vitamins-Minerals (PRESERVISION AREDS) CAPS Take 2 capsules by mouth daily.    oxyCODONE (ROXICODONE) 5 MG immediate release tablet Take 1 tablet (5 mg total) by mouth every 4 (four) hours as needed for severe pain.    pantoprazole (PROTONIX) 40 MG tablet Take 1 tablet (40 mg total) by mouth daily.    rosuvastatin (CRESTOR) 20 MG tablet Take 1 tablet (20 mg total) by mouth daily.    Semaglutide, [name], (OZEMPIC, [name],) 2 MG/1.5ML SOPN Inject 1 mg into the skin once a week. 12/03/2021: LF per [facility] 08/19/21 for a 84 day supply.    senna-docusate (SENOKOT-S) 8.6-50 MG tablet Take 1 tablet by mouth 2 (two) times  daily between meals as needed for mild constipation or moderate constipation. (Patient taking differently: Take 1 tablet by mouth 2 (two) times daily as needed (constipation).)    SYNTHROID 25 MCG tablet Take 1 tablet by mouth Monday - Friday (Patient taking differently: Take 25 mcg by mouth See admin instructions. Take 25 mcg by mouth Monday through Friday.)    tamsulosin (FLOMAX) 0.4 MG CAPS capsule Take 0.4 mg by mouth daily.    No facility-administered encounter medications on file as of 12/13/2021.  Contacted [name] for  General Review Call   Chart Review:  Have there been any documented new, changed, or discontinued medications since last visit? No  Has there been any documented recent hospitalizations or ED visits since last visit with Clinical Pharmacist? No Brief Summary: None  Adherence Review:  Does the Clinical Pharmacist Assistant have access to adherence rates? Yes Adherence rates for STAR metric medications (Rosuvastatin 20 mg- Last filled 10-14-2021 90 [facility]. Previous filled 07-16-2021 90 DS. Ozempic Last filled 08-16-2021 84 DS [facility]). Adherence rates for medications indicated for disease state being reviewed (None). Does the patient have >5 day gap between last estimated fill dates for any of the above medications or other medication gaps? Yes Reason for medication gaps: Patient has 3 weeks of supply left.   Disease State Questions:  Able to connect with Patient? Yes Did patient have any problems with their health recently? No  Have you had any admissions or emergency room visits or worsening of your condition(s) since last visit? No  Have you had any visits with new specialists or providers since your last visit? Yes Explain:  Have you had any new health care problem(s) since your last visit? No  Have you run out of any of your medications since you last spoke with clinical pharmacist? No  Are there any medications you are not taking as prescribed? No  Are you having any issues or side effects with your medications? No  Do you have any other health concerns or questions you want to discuss with your Clinical Pharmacist before your next visit? No  Are there any health concerns that you feel we can do a better job addressing? No  Are you having any problems with any of the following since the last visit: (select all that apply)  None  12. Any falls since last visit? No  13. Any increased or uncontrolled pain since last visit? No   14. Next visit Type: office        Visit with: [name], DPM        Date: 12-28-2021        Time: 8:45  76. Additional Details? No    Care Gaps: None  Star Rating Drugs: Rosuvastatin 20 mg- Last filled 10-14-2021 [facility] Ozempic 1 mg- Last filled 01-27-2021 90 [facility] mail (Contacted pharmacy and was told last filled 08-19-2021 84 DS and is need of refills. Patient isn't enrolled in automatic refills so will have to call in  to fill. Refill request sent to [facility].Patient stated he has 3 weeks left of supply)  [place] Clinical Pharmacist Assistant [phone]

## 2021-12-14 ENCOUNTER — Telehealth: Payer: Self-pay | Admitting: Nurse Practitioner

## 2021-12-14 ENCOUNTER — Other Ambulatory Visit: Payer: Self-pay | Admitting: Nurse Practitioner

## 2021-12-14 DIAGNOSIS — Z22322 Carrier or suspected carrier of Methicillin resistant Staphylococcus aureus: Secondary | ICD-10-CM

## 2021-12-14 MED ORDER — SULFAMETHOXAZOLE-TRIMETHOPRIM 400-80 MG PO TABS: 1.0000 | ORAL_TABLET | Freq: Two times a day (BID) | ORAL | 0 refills | Status: AC

## 2021-12-14 NOTE — Telephone Encounter (Signed)
Called patient to inform I have sent a Rx antibiotic to his local pharmacy to treat infection that was obtained from a blood culture. Left vm to return call to office. Office is closed today but has providers on call if necessary

## 2021-12-15 LAB — KAPPA/LAMBDA LIGHT CHAINS
Kappa free light chain: 182.3 mg/L — ABNORMAL HIGH (ref 3.3–19.4)
Kappa, lambda light chain ratio: 2.02 — ABNORMAL HIGH (ref 0.26–1.65)
Lambda free light chains: 90.3 mg/L — ABNORMAL HIGH (ref 5.7–26.3)

## 2021-12-16 ENCOUNTER — Encounter (INDEPENDENT_AMBULATORY_CARE_PROVIDER_SITE_OTHER): Payer: Self-pay | Admitting: Ophthalmology

## 2021-12-16 ENCOUNTER — Encounter (INDEPENDENT_AMBULATORY_CARE_PROVIDER_SITE_OTHER): Payer: Medicare Other | Admitting: Ophthalmology

## 2021-12-16 ENCOUNTER — Ambulatory Visit (INDEPENDENT_AMBULATORY_CARE_PROVIDER_SITE_OTHER): Payer: Medicare Other | Admitting: Ophthalmology

## 2021-12-16 DIAGNOSIS — E113591 Type 2 diabetes mellitus with proliferative diabetic retinopathy without macular edema, right eye: Secondary | ICD-10-CM | POA: Diagnosis not present

## 2021-12-16 DIAGNOSIS — L97909 Non-pressure chronic ulcer of unspecified part of unspecified lower leg with unspecified severity: Secondary | ICD-10-CM | POA: Diagnosis not present

## 2021-12-16 DIAGNOSIS — E113592 Type 2 diabetes mellitus with proliferative diabetic retinopathy without macular edema, left eye: Secondary | ICD-10-CM

## 2021-12-16 DIAGNOSIS — I70299 Other atherosclerosis of native arteries of extremities, unspecified extremity: Secondary | ICD-10-CM

## 2021-12-16 DIAGNOSIS — E103591 Type 1 diabetes mellitus with proliferative diabetic retinopathy without macular edema, right eye: Secondary | ICD-10-CM

## 2021-12-16 LAB — ERYTHROPOIETIN: Erythropoietin: 30 m[IU]/mL — ABNORMAL HIGH (ref 2.6–18.5)

## 2021-12-16 LAB — METHYLMALONIC ACID, SERUM: Methylmalonic Acid, Quantitative: 256 nmol/L (ref 0–378)

## 2021-12-16 NOTE — Assessment & Plan Note (Signed)
Much less active PDR.  Regions of nonperfusion now well treated post [facility] May 2023

## 2021-12-16 NOTE — Progress Notes (Signed)
12/16/2021     CHIEF COMPLAINT Patient presents for  Chief Complaint  Patient presents with   Diabetic Retinopathy without Macular Edema      HISTORY OF PRESENT ILLNESS: Micheal Walls is a 86 y.o. male who presents to the clinic today for:   HPI   6 weeks for DILATE OU, COLOR FP, OCT. Pt stated vision has been stable since last visit.  Last edited by Angeline Slim on 12/16/2021  9:32 AM.      Referring physician: [name], [name] [address] [place],  [place] [address]  HISTORICAL INFORMATION:   Selected notes from the MEDICAL RECORD NUMBER    Lab Results  Component Value Date   HGBA1C 5.9 (H) 11/17/2021     CURRENT MEDICATIONS: Current Outpatient Medications (Ophthalmic Drugs)  Medication Sig   dorzolamide (TRUSOPT) 2 % ophthalmic solution Place 1 drop into both eyes 2 (two) times daily.   ketotifen (ZADITOR) 0.025 % ophthalmic solution Place 1 drop into both eyes 2 (two) times daily.   latanoprost (XALATAN) 0.005 % ophthalmic solution Place 1 drop into both eyes at bedtime.    No current facility-administered medications for this visit. (Ophthalmic Drugs)   Current Outpatient Medications (Other)  Medication Sig   allopurinol (ZYLOPRIM) 100 MG tablet Take 100 mg by mouth 2 (two) times daily.   aspirin EC 81 MG tablet Take 81 mg by mouth daily. Swallow whole.   colchicine 0.6 MG tablet Take 0.6 mg by mouth once a week.   Continuous Blood Gluc Sensor (FREESTYLE LIBRE 14 DAY SENSOR) MISC Use as directed to check blood sugars   diltiazem (CARDIZEM CD) 240 MG 24 hr capsule Take 1 capsule (240 mg total) by mouth daily.   ferrous sulfate 325 (65 FE) MG tablet Take 1 tablet (325 mg total) by mouth 2 (two) times daily with a meal.   furosemide (LASIX) 40 MG tablet Take 1 tablet in the morning and one in the evening (Patient taking differently: Take 40 mg by mouth 2 (two) times daily.)   gabapentin (NEURONTIN) 100 MG capsule TAKE TWO CAPSULES BY MOUTH EVERY  MORNING and TAKE ONE CAPSULE BY MOUTH AT NOON and TAKE ONE CAPSULE BY MOUTH EVERY EVENING (Patient taking differently: Take 100-200 mg by mouth See admin instructions. Take two capsules by mouth every morning and then take one capsule by mouth at noon and one capsule by mouth every evening.)   glucose blood (FREESTYLE PRECISION NEO TEST) test strip Use as instructed   Multiple Vitamins-Minerals (MULTIVITAMIN WITH MINERALS) tablet Take 1 tablet by mouth daily.   Multiple Vitamins-Minerals (PRESERVISION AREDS) CAPS Take 2 capsules by mouth daily.   oxyCODONE (ROXICODONE) 5 MG immediate release tablet Take 1 tablet (5 mg total) by mouth every 4 (four) hours as needed for severe pain.   pantoprazole (PROTONIX) 40 MG tablet Take 1 tablet (40 mg total) by mouth daily.   rosuvastatin (CRESTOR) 20 MG tablet Take 1 tablet (20 mg total) by mouth daily.   Semaglutide, [name], (OZEMPIC, [name],) 2 MG/1.5ML SOPN Inject 1 mg into the skin once a week.   senna-docusate (SENOKOT-S) 8.6-50 MG tablet Take 1 tablet by mouth 2 (two) times daily between meals as needed for mild constipation or moderate constipation. (Patient taking differently: Take 1 tablet by mouth 2 (two) times daily as needed (constipation).)   sulfamethoxazole-trimethoprim (BACTRIM) 400-80 MG tablet Take 1 tablet by mouth 2 (two) times daily for 10 days.   SYNTHROID 25  MCG tablet Take 1 tablet by mouth Monday - Friday (Patient taking differently: Take 25 mcg by mouth See admin instructions. Take 25 mcg by mouth Monday through Friday.)   tamsulosin (FLOMAX) 0.4 MG CAPS capsule Take 0.4 mg by mouth daily.   No current facility-administered medications for this visit. (Other)      REVIEW OF SYSTEMS: ROS   Negative for: Constitutional, Gastrointestinal, Neurological, Skin, Genitourinary, Musculoskeletal, HENT, Endocrine, Cardiovascular, Eyes, Respiratory, Psychiatric, Allergic/Imm, Heme/Lymph Last edited by Silvestre Moment on 12/16/2021  9:32 AM.        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[place])    right   Anemia    Aspiration pneumonia ([place]) 2/50/5397   Diastolic heart failure (HCC)    Diverticulosis    DM (diabetes mellitus) ([place])    Gout    Hyperlipemia    OSA on CPAP    RBBB    Sepsis ([place]) 11/02/2019   Systemic hypertension    Past Surgical History:  Procedure Laterality Date   ABDOMINAL AORTOGRAM W/LOWER EXTREMITY Left 08/11/2020   Procedure: ABDOMINAL AORTOGRAM W/LOWER EXTREMITY;  Surgeon: Serafina Mitchell, MD;  Location: [facility];  Service: Cardiovascular;  Laterality: Left;   ABDOMINAL AORTOGRAM W/LOWER EXTREMITY N/A 02/26/2021   Procedure: ABDOMINAL AORTOGRAM W/LOWER EXTREMITY;  Surgeon: Cherre Robins, MD;  Location: [facility];  Service: Cardiovascular;  Laterality: N/A;   AMPUTATION Left 09/14/2020   Procedure: AMPUTATION LEFT GREAT TOE;  Surgeon: [name], DPM;  Location: [facility];  Service: Podiatry;  Laterality: Left;   AMPUTATION TOE Left 12/03/2021   Procedure: AMPUTATION TOE, LEFT second;  Surgeon: Lorenda Peck, DPM;  Location: [facility];  Service: Podiatry;  Laterality: Left;   LEG AMPUTATION BELOW KNEE     right   NM MYOCAR PERF WALL MOTION  09/18/2009   no ischemia   PERIPHERAL VASCULAR BALLOON ANGIOPLASTY Left 08/11/2020   Procedure: PERIPHERAL VASCULAR BALLOON ANGIOPLASTY;  Surgeon: Serafina Mitchell, MD;  Location: [facility];  Service: Cardiovascular;  Laterality: Left;  AT   PERIPHERAL VASCULAR INTERVENTION Left 02/26/2021   Procedure: PERIPHERAL VASCULAR INTERVENTION;  Surgeon: Cherre Robins, MD;  Location: [facility];  Service: Cardiovascular;  Laterality: Left;    FAMILY HISTORY Family History  Problem Relation Age of Onset   Diabetes Mother    Heart attack Father    Cancer Sister     SOCIAL  HISTORY Social History   Tobacco Use   Smoking status: Former    Types: Cigars    Quit date: 1997    Years since quitting: 26.5   Smokeless tobacco: Never  Vaping Use   Vaping Use: Former  Substance Use Topics   Alcohol use: No    Alcohol/week: 0.0 standard drinks of alcohol   Drug use: No         OPHTHALMIC EXAM:  Base Eye Exam     Visual Acuity (ETDRS)       Right Left   Dist cc 20/50 -2 20/30 -1   Dist ph cc 20/40 -2 20/25 -2    Correction: Glasses         Tonometry (Tonopen, 9:38 AM)       Right Left   Pressure 19 19         Pupils  Pupils APD   Right PERRL None   Left PERRL None         Visual Fields       Left Right    Full Full         Extraocular Movement       Right Left    Full Full         Neuro/Psych     Oriented x3: Yes   Mood/Affect: Normal         Dilation     Both eyes: 1.0% Mydriacyl, 2.5% Phenylephrine @ 9:38 AM           Slit Lamp and Fundus Exam     External Exam       Right Left   External Normal Normal         Slit Lamp Exam       Right Left   Lids/Lashes Normal Normal   Conjunctiva/Sclera White and quiet White and quiet   Cornea Clear Clear   Anterior Chamber Deep and quiet Deep and quiet   Iris Round and reactive Round and reactive   Lens Centered posterior chamber intraocular lens Centered posterior chamber intraocular lens   Anterior Vitreous Normal Normal         Fundus Exam       Right Left   Posterior Vitreous Normal Normal   Disc Normal Normal   C/D Ratio 0.3 0.3   Macula Normal, good focal laser, no active CSME Normal, good focal laser, no active CSME   Vessels PDR-quiet PDR-quiet   Periphery Good PRP 360, no NVE, yet large regional area temporally of nonperfusion. Good PRP 360, Fibrotic RAM  superotemporal arcade, with now surrounding subretinal hemorrhage.  Large regional nonperfusion in this area.            IMAGING AND PROCEDURES  Imaging and Procedures  for 12/16/21  OCT, Retina - OU - Both Eyes       Right Eye Quality was borderline. Scan locations included subfoveal. Central Foveal Thickness: 204. Progression has improved. Findings include abnormal foveal contour, central retinal atrophy, outer retinal atrophy.   Left Eye Quality was good. Scan locations included subfoveal. Central Foveal Thickness: 225. Progression has been stable. Findings include abnormal foveal contour, central retinal atrophy, outer retinal atrophy.   Notes No active CSME OU     Color Fundus Photography Optos - OU - Both Eyes       Right Eye Progression has been stable. Disc findings include normal observations. Macula : microaneurysms.   Left Eye Progression has been stable. Disc findings include normal observations. Macula : microaneurysms.   Notes Proliferative diabetic retinopathy OU quiet, clear media, no active NVE growth OD, moderate scatter PRP OU.  Now with better and improved ocal PRP to this area  Improved PRP OS             ASSESSMENT/PLAN:  Proliferative diabetic retinopathy of right eye ([place]) Much less active PDR.  Regions of nonperfusion now well treated post PRP May 2023      ICD-10-CM   1. Proliferative diabetic retinopathy of left eye without macular edema associated with type 2 diabetes mellitus (HCC)  E11.3592 OCT, Retina - OU - Both Eyes    Color Fundus Photography Optos - OU - Both Eyes    2. Proliferative diabetic retinopathy of right eye associated with type 1 diabetes mellitus, macular edema presence unspecified ([place])  E10.3591       1.  OU stable, doing  very well post PRP.  This protects against PDR progression as this patient challenged by ambulatory issues.  2.  3.  Ophthalmic Meds Ordered this visit:  No orders of the defined types were placed in this encounter.      Return in about 4 months (around 04/18/2022) for DILATE OU, COLOR FP, OCT.  There are no Patient Instructions on file for this  visit.   Explained the diagnoses, plan, and follow up with the patient and they expressed understanding.  Patient expressed understanding of the importance of proper follow up care.   Clent Demark Katharine Rochefort M.D. Diseases & Surgery of the Retina and Vitreous Retina & Diabetic [place] 12/16/21     Abbreviations: M myopia (nearsighted); A astigmatism; H hyperopia (farsighted); P presbyopia; Mrx spectacle prescription;  CTL contact lenses; OD right eye; OS left eye; OU both eyes  XT exotropia; ET esotropia; PEK punctate epithelial keratitis; PEE punctate epithelial erosions; DES dry eye syndrome; MGD meibomian gland dysfunction; ATs artificial tears; PFAT's preservative free artificial tears; [place] nuclear sclerotic cataract; PSC posterior subcapsular cataract; ERM epi-retinal membrane; PVD posterior vitreous detachment; RD retinal detachment; DM diabetes mellitus; DR diabetic retinopathy; NPDR non-proliferative diabetic retinopathy; PDR proliferative diabetic retinopathy; CSME clinically significant macular edema; DME diabetic macular edema; dbh dot blot hemorrhages; CWS cotton wool spot; POAG primary open angle glaucoma; C/D cup-to-disc ratio; HVF humphrey visual field; GVF goldmann visual field; OCT optical coherence tomography; IOP intraocular pressure; BRVO Branch retinal vein occlusion; CRVO central retinal vein occlusion; CRAO central retinal artery occlusion; BRAO branch retinal artery occlusion; RT retinal tear; SB scleral buckle; PPV pars plana vitrectomy; VH Vitreous hemorrhage; PRP panretinal laser photocoagulation; IVK intravitreal kenalog; VMT vitreomacular traction; MH Macular hole;  NVD neovascularization of the disc; NVE neovascularization elsewhere; AREDS age related eye disease study; ARMD age related macular degeneration; POAG primary open angle glaucoma; EBMD epithelial/anterior basement membrane dystrophy; ACIOL anterior chamber intraocular lens; IOL intraocular lens; PCIOL posterior chamber  intraocular lens; Phaco/IOL phacoemulsification with intraocular lens placement; [place] photorefractive keratectomy; LASIK laser assisted in situ keratomileusis; HTN hypertension; DM diabetes mellitus; COPD chronic obstructive pulmonary disease

## 2021-12-17 ENCOUNTER — Telehealth: Payer: Self-pay

## 2021-12-17 DIAGNOSIS — Z794 Long term (current) use of insulin: Secondary | ICD-10-CM

## 2021-12-17 MED ORDER — OZEMPIC (1 MG/DOSE) 2 MG/1.5ML ~~LOC~~ SOPN: 1.0000 mg | PEN_INJECTOR | SUBCUTANEOUS | 1 refills | Status: DC

## 2021-12-17 NOTE — Telephone Encounter (Signed)
  Patient needs refills of Ozempic sent to the pharmacy. Sent prescription refills to [facility].   [name], CPP, PharmD Clinical Pharmacist Practitioner [facility] [phone]

## 2021-12-23 LAB — MULTIPLE MYELOMA PANEL, SERUM
Albumin SerPl Elph-Mcnc: 3.3 g/dL (ref 2.9–4.4)
Albumin/Glob SerPl: 0.9 (ref 0.7–1.7)
Alpha 1: 0.2 g/dL (ref 0.0–0.4)
Alpha2 Glob SerPl Elph-Mcnc: 0.7 g/dL (ref 0.4–1.0)
B-Globulin SerPl Elph-Mcnc: 1.3 g/dL (ref 0.7–1.3)
Gamma Glob SerPl Elph-Mcnc: 1.6 g/dL (ref 0.4–1.8)
Globulin, Total: 3.8 g/dL (ref 2.2–3.9)
IgA: 787 mg/dL — ABNORMAL HIGH (ref 61–437)
IgG (Immunoglobin G), Serum: 1601 mg/dL (ref 603–1613)
IgM (Immunoglobulin M), Srm: 54 mg/dL (ref 15–143)
M Protein SerPl Elph-Mcnc: 0.6 g/dL — ABNORMAL HIGH
Total Protein ELP: 7.1 g/dL (ref 6.0–8.5)

## 2021-12-25 ENCOUNTER — Encounter: Payer: Self-pay | Admitting: Physician Assistant

## 2021-12-27 ENCOUNTER — Telehealth: Payer: Self-pay | Admitting: Physician Assistant

## 2021-12-27 DIAGNOSIS — D472 Monoclonal gammopathy: Secondary | ICD-10-CM

## 2021-12-27 DIAGNOSIS — D649 Anemia, unspecified: Secondary | ICD-10-CM

## 2021-12-27 NOTE — Telephone Encounter (Signed)
Scheduled appointment per provider. Left message with appointment details.

## 2021-12-27 NOTE — Telephone Encounter (Signed)
I called patient and his daughter, Ms. [name], to review the lab results from 12/13/2021. Findings are consistent with stable anemia with Hgb 10.5. 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 We recommend to complete monoclonal protein workup with 24 hour UPEP, bone met survey and a bone marrow biopsy. Patient and daughter are in agreement with the plan and want to move forward. We will arrange a follow up visit with Dr. Lorenso Courier after bone marrow biopsy to review results.

## 2021-12-28 ENCOUNTER — Telehealth: Payer: Self-pay | Admitting: Physician Assistant

## 2021-12-28 ENCOUNTER — Ambulatory Visit (INDEPENDENT_AMBULATORY_CARE_PROVIDER_SITE_OTHER): Payer: Medicare Other | Admitting: Podiatry

## 2021-12-28 DIAGNOSIS — L97522 Non-pressure chronic ulcer of other part of left foot with fat layer exposed: Secondary | ICD-10-CM | POA: Diagnosis not present

## 2021-12-28 NOTE — Patient Instructions (Signed)
Every other day wash the foot with Dial antibacterial soap and water and then apply the gentamicin ointment

## 2021-12-28 NOTE — Progress Notes (Signed)
  Subjective:  Patient ID: Micheal Walls, male    DOB: 04/11/1936,  MRN: [id number]  Chief Complaint  Patient presents with   Routine Post Op      2 weeks  post op , suture removal.    DOS: 12/03/1926 Procedure: Amputation left second toe  86 y.o. male returns for post-op check.  Overall doing okay Review of Systems: Negative except as noted in the HPI. Denies N/V/F/Ch.   Objective:  There were no vitals filed for this visit. There is no height or weight on file to calculate BMI. Constitutional Well developed. Well nourished.  Vascular Foot warm and well perfused. Capillary refill normal to all digits.  Calf is soft and supple, no posterior calf or knee pain, negative Homans' sign  Neurologic Normal speech. Oriented to person, place, and time. Epicritic sensation to light touch grossly absent bilaterally.  Dermatologic Residual ulceration post suture removal measures 3.0 x 0.8 x 0.6 cm, granular wound bed no signs of infection exposed subcutaneous tissue  Orthopedic: He has no tenderness to palpation noted about the surgical site.   Pathology results showed osteomyelitis with no residual infection noted at margin    Assessment:   No diagnosis found.  Plan:  Patient was evaluated and treated and all questions answered.  Ulcer left foot -We discussed the etiology and factors that are a part of the wound healing process.  We also discussed the risk of infection both soft tissue and osteomyelitis from open ulceration.  Discussed the risk of limb loss if this happens or worsens. -Debridement as below. -Dressed with Iodosorb, DSD. -Continue home dressing changes daily with 4 x 4 gauze and gentamicin -Continue off-loading with surgical shoe.   Procedure: Excisional Debridement of Wound Rationale: Removal of non-viable soft tissue from the wound to promote healing.  Anesthesia: none Post-Debridement Wound Measurements:3.0 x 0.8 x 0.6 cm Type of Debridement: Sharp  Excisional Tissue Removed: Non-viable soft tissue Depth of Debridement: subcutaneous tissue. Technique: Sharp excisional debridement to bleeding, viable wound base.  Dressing: Dry, sterile, compression dressing. Disposition: Patient tolerated procedure well.    Return in about 3 weeks (around 01/18/2022) for wound care.       Return in about 3 weeks (around 01/18/2022) for wound care.

## 2021-12-28 NOTE — Telephone Encounter (Signed)
Scheduled per 7/18 secure chat, pt daughter has been called and confirmed appts

## 2021-12-29 DIAGNOSIS — H353132 Nonexudative age-related macular degeneration, bilateral, intermediate dry stage: Secondary | ICD-10-CM | POA: Diagnosis not present

## 2021-12-29 DIAGNOSIS — H35372 Puckering of macula, left eye: Secondary | ICD-10-CM | POA: Diagnosis not present

## 2021-12-29 DIAGNOSIS — E103593 Type 1 diabetes mellitus with proliferative diabetic retinopathy without macular edema, bilateral: Secondary | ICD-10-CM | POA: Diagnosis not present

## 2021-12-29 DIAGNOSIS — H401132 Primary open-angle glaucoma, bilateral, moderate stage: Secondary | ICD-10-CM | POA: Diagnosis not present

## 2021-12-30 ENCOUNTER — Ambulatory Visit (HOSPITAL_COMMUNITY)
Admission: RE | Admit: 2021-12-30 | Discharge: 2021-12-30 | Disposition: A | Discharge status: Home / Self Care | Payer: Medicare Other | Source: Ambulatory Visit | Attending: Physician Assistant | Admitting: Physician Assistant

## 2021-12-30 ENCOUNTER — Inpatient Hospital Stay: Payer: Medicare Other

## 2021-12-30 ENCOUNTER — Other Ambulatory Visit: Payer: Medicare Other

## 2021-12-30 DIAGNOSIS — M160 Bilateral primary osteoarthritis of hip: Secondary | ICD-10-CM | POA: Diagnosis not present

## 2021-12-30 DIAGNOSIS — I739 Peripheral vascular disease, unspecified: Secondary | ICD-10-CM | POA: Diagnosis not present

## 2021-12-30 DIAGNOSIS — D472 Monoclonal gammopathy: Secondary | ICD-10-CM | POA: Insufficient documentation

## 2021-12-30 DIAGNOSIS — D649 Anemia, unspecified: Secondary | ICD-10-CM | POA: Diagnosis not present

## 2021-12-30 DIAGNOSIS — M170 Bilateral primary osteoarthritis of knee: Secondary | ICD-10-CM | POA: Diagnosis not present

## 2021-12-30 DIAGNOSIS — E1122 Type 2 diabetes mellitus with diabetic chronic kidney disease: Secondary | ICD-10-CM | POA: Diagnosis not present

## 2021-12-30 DIAGNOSIS — N183 Chronic kidney disease, stage 3 unspecified: Secondary | ICD-10-CM | POA: Diagnosis not present

## 2021-12-30 DIAGNOSIS — I129 Hypertensive chronic kidney disease with stage 1 through stage 4 chronic kidney disease, or unspecified chronic kidney disease: Secondary | ICD-10-CM | POA: Diagnosis not present

## 2022-01-03 ENCOUNTER — Telehealth: Payer: Self-pay

## 2022-01-03 ENCOUNTER — Telehealth: Payer: Self-pay | Admitting: Physician Assistant

## 2022-01-03 NOTE — Telephone Encounter (Signed)
-----   Message from Lincoln Brigham, PA-C sent at 01/03/2022 12:54 PM EDT ----- Please notify patient that there is no evidence of lytic bone lesions seen on bone survey.

## 2022-01-03 NOTE — Telephone Encounter (Signed)
Scheduled per 7/24 secure chat, pt has been called and confirmed

## 2022-01-03 NOTE — Telephone Encounter (Signed)
Pt advised and results confirmed.  Pt reminded to keep BM BX appt on 8/7

## 2022-01-05 ENCOUNTER — Other Ambulatory Visit: Payer: Self-pay | Admitting: Cardiovascular Disease

## 2022-01-06 ENCOUNTER — Telehealth: Payer: Self-pay

## 2022-01-06 NOTE — Chronic Care Management (AMB) (Signed)
Chronic Care Management Pharmacy Assistant   Name: Micheal Walls  MRN: [id number] DOB: 1936/05/19  Reason for Encounter: Medication Review/ Medication coordination  Recent office visits:  None  Recent consult visits:  12-28-2021 Edwin Cap, DPM (Podiatry). Visit for routine post op of ulcer of left foot.  12-16-2021 Rankin, Alford Highland, MD (Ophthalmology). Color Fundus Photography Optos - OU - Both Eyes and OCT, Retina - OU - Both Eyes completed.  Hospital visits:  None in previous 6 months  Medications: Outpatient Encounter Medications as of 01/06/2022  Medication Sig   allopurinol (ZYLOPRIM) 100 MG tablet Take 100 mg by mouth 2 (two) times daily.   aspirin EC 81 MG tablet Take 81 mg by mouth daily. Swallow whole.   colchicine 0.6 MG tablet Take 0.6 mg by mouth once a week.   Continuous Blood Gluc Sensor (FREESTYLE LIBRE 14 DAY SENSOR) MISC Use as directed to check blood sugars   diltiazem (CARDIZEM CD) 240 MG 24 hr capsule TAKE ONE CAPSULE BY MOUTH ONCE DAILY   dorzolamide (TRUSOPT) 2 % ophthalmic solution Place 1 drop into both eyes 2 (two) times daily.   ferrous sulfate 325 (65 FE) MG tablet Take 1 tablet (325 mg total) by mouth 2 (two) times daily with a meal.   furosemide (LASIX) 40 MG tablet Take 1 tablet in the morning and one in the evening (Patient taking differently: Take 40 mg by mouth 2 (two) times daily.)   gabapentin (NEURONTIN) 100 MG capsule TAKE TWO CAPSULES BY MOUTH EVERY MORNING and TAKE ONE CAPSULE BY MOUTH AT NOON and TAKE ONE CAPSULE BY MOUTH EVERY EVENING (Patient taking differently: Take 100-200 mg by mouth See admin instructions. Take two capsules by mouth every morning and then take one capsule by mouth at noon and one capsule by mouth every evening.)   glucose blood (FREESTYLE PRECISION NEO TEST) test strip Use as instructed   ketotifen (ZADITOR) 0.025 % ophthalmic solution Place 1 drop into both eyes 2 (two) times daily.   latanoprost (XALATAN)  0.005 % ophthalmic solution Place 1 drop into both eyes at bedtime.    Multiple Vitamins-Minerals (MULTIVITAMIN WITH MINERALS) tablet Take 1 tablet by mouth daily.   Multiple Vitamins-Minerals (PRESERVISION AREDS) CAPS Take 2 capsules by mouth daily.   oxyCODONE (ROXICODONE) 5 MG immediate release tablet Take 1 tablet (5 mg total) by mouth every 4 (four) hours as needed for severe pain.   pantoprazole (PROTONIX) 40 MG tablet Take 1 tablet (40 mg total) by mouth daily.   rosuvastatin (CRESTOR) 20 MG tablet Take 1 tablet (20 mg total) by mouth daily.   Semaglutide, [name], (OZEMPIC, [name],) 2 MG/1.5ML SOPN Inject 1 mg into the skin once a week.   senna-docusate (SENOKOT-S) 8.6-50 MG tablet Take 1 tablet by mouth 2 (two) times daily between meals as needed for mild constipation or moderate constipation. (Patient taking differently: Take 1 tablet by mouth 2 (two) times daily as needed (constipation).)   SYNTHROID 25 MCG tablet Take 1 tablet by mouth Monday - Friday (Patient taking differently: Take 25 mcg by mouth See admin instructions. Take 25 mcg by mouth Monday through Friday.)   tamsulosin (FLOMAX) 0.4 MG CAPS capsule Take 0.4 mg by mouth daily.   No facility-administered encounter medications on file as of 01/06/2022.  Reviewed chart for medication changes ahead of medication coordination call.  No hospital visits since last care coordination call/Pharmacist visit.  No medication changes indicated   BP Readings from Last  3 Encounters:  12/13/21 (!) 130/59  12/04/21 (!) 125/49  11/17/21 120/64    Lab Results  Component Value Date   HGBA1C 5.9 (H) 11/17/2021     Patient obtains medications through Adherence Packaging  90 Days   Last adherence delivery included:  Flomax 0.4 mg- 1 tablet daily (breakfast)              Allopurinol 100 mg- 2 tablets daily (breakfast) Diltiazem CD 240 mg- 1 tablet daily (breakfast) Pantoprazole 40 mg- 1 tablet daily (bedtime) Colchicine 0.6 mg- 1  tablet once weekly (breakfast) Synthroid 25 mcg- 1 tablet at breakfast Monday-Friday Gabapentin 100 mg- 2 tablets at breakfast,  1 tablet at lunch, 1 tablet with evening meal Furosemide 40 mg- 1 tablet with breakfast and 1 tablet with evening meal Rosuvastatin 10 mg- 1 tablet daily (bedtime)  Patient declined (meds) last delivery: None  Patient is due for next adherence delivery on: 01-18-2022  Called patient and reviewed medications and coordinated delivery.  This delivery to include: Allopurinol 100 mg- 2 tablets daily (breakfast) Flomax 0.4 mg- 1 tablet daily (breakfast)   Diltiazem CD 240 mg- 1 tablet daily (breakfast) Pantoprazole 40 mg- 1 tablet daily (bedtime) Colchicine 0.6 mg- 1 tablet once weekly (breakfast) Gabapentin 100 mg- 2 tablets at breakfast,  1 tablet at lunch, 1 tablet with evening meal Furosemide 40 mg- 1 tablet with breakfast and 1 tablet with evening meal Rosuvastatin 10 mg- 1 tablet daily (bedtime) Synthroid 25 mcg- 1 tablet at breakfast Monday-Friday  No acute/short fill needed  Patient declined the following medications: None  Patient needs refills for:  Allopurinol- sent by chasity Flomax- sent by chasity Diltiazem- sent by chasity Pantoprazole- sent to PCP Furosemide- sent to PCP  Confirmed delivery date of 01-18-2022, advised patient that pharmacy will contact them the morning of delivery.  Care Gaps: None  Star Rating Drugs: Rosuvastatin 20 mg- Last filled 10-14-2021 90 DS [facility] Ozempic 1 mg- Last filled 12-17-2021 90 DS El[facility] mail order   [place] Pharmacist Assistant [phone]

## 2022-01-10 ENCOUNTER — Ambulatory Visit: Payer: Self-pay

## 2022-01-10 NOTE — Patient Outreach (Signed)
  Care Management   Outreach Note  01/10/2022 Name: RENDELL THIVIERGE MRN: [id number] DOB: April 16, 1936  An unsuccessful telephone outreach was attempted today. The patient was referred to the case management team for assistance with care management and care coordination.   Follow Up Plan:  The care management team will reach out to the patient again over the next 10 days.   [name], [name], [name], Certified Dementia Practitioner Care Coordination [phone]

## 2022-01-11 ENCOUNTER — Ambulatory Visit: Payer: Self-pay

## 2022-01-11 NOTE — Patient Instructions (Signed)
Visit Information  Thank you for taking time to visit with me today. Please don't hesitate to contact me if I can be of assistance to you.   Following are the goals we discussed today:   Goals Addressed   None     Please call the care guide team at [phone] if you need to schedule an appointment with me.  If you are experiencing a Mental Health or Behavioral Health Crisis or need someone to talk to, please go to [facility] [address], [place] ([phone])  Patient verbalizes understanding of instructions and care plan provided today and agrees to view in MyChart. Active MyChart status and patient understanding of how to access instructions and care plan via MyChart confirmed with patient.     No further follow up required: Please contact me as needed.  Lamyiah Crawshaw, [name], [name], Certified Dementia Practitioner Care Coordination [phone]          

## 2022-01-11 NOTE — Patient Outreach (Signed)
  Care Coordination   Initial Visit Note   01/11/2022 Name: Micheal Walls MRN: [id number] DOB: September 09, 1935  Micheal Walls is a 86 y.o. year old male who sees [name], [place] for primary care. I spoke with  [name] by phone today  What matters to the patients health and wellness today?  Doing well, no concerns   Goals Addressed   None     SDOH assessments and interventions completed:   Yes SDOH Interventions Today    Flowsheet Row Most Recent Value  SDOH Interventions   Food Insecurity Interventions Intervention Not Indicated  Housing Interventions Intervention Not Indicated  Transportation Interventions Intervention Not Indicated       Care Coordination Interventions Activated:  No Care Coordination Interventions:  No, not indicated  Follow up plan: No further intervention required.  Encounter Outcome:  Pt. Visit Completed  [name], [name], [name], Certified Dementia Practitioner Care Coordination [phone]

## 2022-01-14 ENCOUNTER — Other Ambulatory Visit: Payer: Self-pay | Admitting: Radiology

## 2022-01-14 ENCOUNTER — Other Ambulatory Visit: Payer: Self-pay | Admitting: Nurse Practitioner

## 2022-01-14 DIAGNOSIS — D472 Monoclonal gammopathy: Secondary | ICD-10-CM

## 2022-01-17 ENCOUNTER — Other Ambulatory Visit: Payer: Self-pay | Admitting: Physician Assistant

## 2022-01-17 ENCOUNTER — Ambulatory Visit (HOSPITAL_COMMUNITY)
Admission: RE | Admit: 2022-01-17 | Discharge: 2022-01-17 | Disposition: A | Discharge status: Home / Self Care | Payer: Medicare Other | Source: Ambulatory Visit | Attending: Physician Assistant | Admitting: Physician Assistant

## 2022-01-17 ENCOUNTER — Other Ambulatory Visit: Payer: Self-pay | Admitting: *Deleted

## 2022-01-17 ENCOUNTER — Inpatient Hospital Stay: Payer: Medicare Other

## 2022-01-17 ENCOUNTER — Other Ambulatory Visit: Payer: Self-pay

## 2022-01-17 ENCOUNTER — Inpatient Hospital Stay: Payer: Medicare Other | Attending: Physician Assistant

## 2022-01-17 DIAGNOSIS — D6489 Other specified anemias: Secondary | ICD-10-CM | POA: Diagnosis not present

## 2022-01-17 DIAGNOSIS — Z31448 Encounter for other genetic testing of male for procreative management: Secondary | ICD-10-CM | POA: Insufficient documentation

## 2022-01-17 DIAGNOSIS — D649 Anemia, unspecified: Secondary | ICD-10-CM | POA: Insufficient documentation

## 2022-01-17 DIAGNOSIS — I4510 Unspecified right bundle-branch block: Secondary | ICD-10-CM | POA: Insufficient documentation

## 2022-01-17 DIAGNOSIS — E119 Type 2 diabetes mellitus without complications: Secondary | ICD-10-CM | POA: Insufficient documentation

## 2022-01-17 DIAGNOSIS — I509 Heart failure, unspecified: Secondary | ICD-10-CM | POA: Diagnosis not present

## 2022-01-17 DIAGNOSIS — Z87891 Personal history of nicotine dependence: Secondary | ICD-10-CM | POA: Insufficient documentation

## 2022-01-17 DIAGNOSIS — N189 Chronic kidney disease, unspecified: Secondary | ICD-10-CM | POA: Diagnosis not present

## 2022-01-17 DIAGNOSIS — I739 Peripheral vascular disease, unspecified: Secondary | ICD-10-CM | POA: Insufficient documentation

## 2022-01-17 DIAGNOSIS — D472 Monoclonal gammopathy: Secondary | ICD-10-CM | POA: Insufficient documentation

## 2022-01-17 DIAGNOSIS — G4730 Sleep apnea, unspecified: Secondary | ICD-10-CM | POA: Diagnosis not present

## 2022-01-17 DIAGNOSIS — K5790 Diverticulosis of intestine, part unspecified, without perforation or abscess without bleeding: Secondary | ICD-10-CM | POA: Diagnosis not present

## 2022-01-17 DIAGNOSIS — E785 Hyperlipidemia, unspecified: Secondary | ICD-10-CM | POA: Insufficient documentation

## 2022-01-17 DIAGNOSIS — Z8673 Personal history of transient ischemic attack (TIA), and cerebral infarction without residual deficits: Secondary | ICD-10-CM | POA: Diagnosis not present

## 2022-01-17 DIAGNOSIS — N183 Chronic kidney disease, stage 3 unspecified: Secondary | ICD-10-CM | POA: Diagnosis not present

## 2022-01-17 DIAGNOSIS — D72822 Plasmacytosis: Secondary | ICD-10-CM | POA: Diagnosis not present

## 2022-01-17 DIAGNOSIS — D696 Thrombocytopenia, unspecified: Secondary | ICD-10-CM | POA: Diagnosis not present

## 2022-01-17 DIAGNOSIS — I110 Hypertensive heart disease with heart failure: Secondary | ICD-10-CM | POA: Insufficient documentation

## 2022-01-17 LAB — CBC WITH DIFFERENTIAL/PLATELET
Abs Immature Granulocytes: UNDETERMINED 10*3/uL (ref 0.00–0.07)
Band Neutrophils: UNDETERMINED %
Basophils Absolute: UNDETERMINED 10*3/uL (ref 0.0–0.1)
Basophils Relative: UNDETERMINED %
Blasts: UNDETERMINED %
Eosinophils Absolute: UNDETERMINED 10*3/uL (ref 0.0–0.5)
Eosinophils Relative: UNDETERMINED %
HCT: UNDETERMINED % (ref 39.0–52.0)
Hemoglobin: UNDETERMINED g/dL (ref 13.0–17.0)
Immature Granulocytes: UNDETERMINED %
Lymphocytes Relative: UNDETERMINED %
Lymphs Abs: UNDETERMINED 10*3/uL (ref 0.7–4.0)
MCH: UNDETERMINED pg (ref 26.0–34.0)
MCHC: UNDETERMINED g/dL (ref 30.0–36.0)
MCV: UNDETERMINED fL (ref 80.0–100.0)
Metamyelocytes Relative: UNDETERMINED %
Monocytes Absolute: UNDETERMINED 10*3/uL (ref 0.1–1.0)
Monocytes Relative: UNDETERMINED %
Myelocytes: UNDETERMINED %
Neutro Abs: UNDETERMINED 10*3/uL (ref 1.7–7.7)
Neutrophils Relative %: UNDETERMINED %
Other: UNDETERMINED %
Platelets: UNDETERMINED 10*3/uL (ref 150–400)
Promyelocytes Relative: UNDETERMINED %
RBC Morphology: UNDETERMINED
RBC: UNDETERMINED MIL/uL (ref 4.22–5.81)
RDW: UNDETERMINED % (ref 11.5–15.5)
Smear Review: UNDETERMINED
WBC Morphology: UNDETERMINED
WBC: UNDETERMINED 10*3/uL (ref 4.0–10.5)
nRBC: UNDETERMINED % (ref 0.0–0.2)
nRBC: UNDETERMINED /100 WBC

## 2022-01-17 LAB — CBC
HCT: 32 % — ABNORMAL LOW (ref 39.0–52.0)
Hemoglobin: 10.7 g/dL — ABNORMAL LOW (ref 13.0–17.0)
MCH: 29.2 pg (ref 26.0–34.0)
MCHC: 33.4 g/dL (ref 30.0–36.0)
MCV: 87.4 fL (ref 80.0–100.0)
Platelets: 170 10*3/uL (ref 150–400)
RBC: 3.66 MIL/uL — ABNORMAL LOW (ref 4.22–5.81)
RDW: 16.2 % — ABNORMAL HIGH (ref 11.5–15.5)
WBC: 6 10*3/uL (ref 4.0–10.5)
nRBC: 0 % (ref 0.0–0.2)

## 2022-01-17 LAB — PREPARE RBC (CROSSMATCH)

## 2022-01-17 LAB — HEMOGLOBIN AND HEMATOCRIT (CANCER CENTER ONLY)
HCT: 31.6 % — ABNORMAL LOW (ref 39.0–52.0)
Hemoglobin: 10.5 g/dL — ABNORMAL LOW (ref 13.0–17.0)

## 2022-01-17 LAB — ABO/RH: ABO/RH(D): B POS

## 2022-01-17 LAB — SAMPLE TO BLOOD BANK

## 2022-01-17 LAB — GLUCOSE, CAPILLARY: Glucose-Capillary: 110 mg/dL — ABNORMAL HIGH (ref 70–99)

## 2022-01-17 MED ORDER — LIDOCAINE HCL 1 % IJ SOLN
INTRAMUSCULAR | Status: AC | PRN
  Administered 2022-01-17: 10 mL via INTRADERMAL

## 2022-01-17 MED ORDER — FUROSEMIDE 40 MG PO TABS: ORAL_TABLET | ORAL | 1 refills | Status: DC

## 2022-01-17 MED ORDER — MIDAZOLAM HCL 2 MG/2ML IJ SOLN
INTRAMUSCULAR | Status: AC
  Filled 2022-01-17: qty 2

## 2022-01-17 MED ORDER — NALOXONE HCL 0.4 MG/ML IJ SOLN
INTRAMUSCULAR | Status: AC
  Filled 2022-01-17: qty 1

## 2022-01-17 MED ORDER — FENTANYL CITRATE (PF) 100 MCG/2ML IJ SOLN
INTRAMUSCULAR | Status: AC
  Filled 2022-01-17: qty 2

## 2022-01-17 MED ORDER — FENTANYL CITRATE (PF) 100 MCG/2ML IJ SOLN
INTRAMUSCULAR | Status: AC | PRN
  Administered 2022-01-17: 25 ug via INTRAVENOUS

## 2022-01-17 MED ORDER — SODIUM CHLORIDE 0.9 % IV SOLN: INTRAVENOUS | Status: DC

## 2022-01-17 MED ORDER — FLUMAZENIL 0.5 MG/5ML IV SOLN
INTRAVENOUS | Status: AC
  Filled 2022-01-17: qty 5

## 2022-01-17 MED ORDER — MIDAZOLAM HCL 2 MG/2ML IJ SOLN
INTRAMUSCULAR | Status: AC | PRN
  Administered 2022-01-17: .5 mg via INTRAVENOUS

## 2022-01-17 NOTE — Progress Notes (Signed)
Patient ID: Micheal Walls, male   DOB: 02/11/1936, 86 y.o.   MRN: [id number] Pt s/p BM bx today; just notified from lab tech regarding Mr. Engh's CBC results from this morning. HGB 6.8(10.5 on 12/13/21); Dr. Lorenso Courier notified and request that pt report to [facility] for further evaluation/possible transfusion. Pt updated.

## 2022-01-17 NOTE — Discharge Instructions (Signed)
Moderate Conscious Sedation, Adult, Care After This sheet gives you information about how to care for yourself after your procedure. Your health care provider may also give you more specific instructions. If you have problems or questions, contact your health care provider. What can I expect after the procedure? After the procedure, it is common to have: Sleepiness for several hours. Impaired judgment for several hours. Difficulty with balance. Vomiting if you eat too soon. Follow these instructions at home: For the time period you were told by your health care provider: For questions /concerns may call   Interventional Radiology clinic [phone]   You may remove your dressing and shower tomorrow afternoon      Rest. Do not participate in activities where you could fall or become injured. Do not drive or use machinery. Do not drink alcohol. Do not take sleeping pills or medicines that cause drowsiness. Do not make important decisions or sign legal documents. Do not take care of children on your own. Eating and drinking  Follow the diet recommended by your health care provider. Drink enough fluid to keep your urine pale yellow. If you vomit: Drink water, juice, or soup when you can drink without vomiting. Make sure you have little or no nausea before eating solid foods. General instructions Take over-the-counter and prescription medicines only as told by your health care provider. Have a responsible adult stay with you for the time you are told. It is important to have someone help care for you until you are awake and alert. Do not smoke. Keep all follow-up visits as told by your health care provider. This is important. Contact a health care provider if: You are still sleepy or having trouble with balance after 24 hours. You feel light-headed. You keep feeling nauseous or you keep vomiting. You develop a rash. You have a fever. You have redness or swelling around the IV  site. Get help right away if: You have trouble breathing. You have new-onset confusion at home. Summary After the procedure, it is common to feel sleepy, have impaired judgment, or feel nauseous if you eat too soon. Rest after you get home. Know the things you should not do after the procedure. Follow the diet recommended by your health care provider and drink enough fluid to keep your urine pale yellow. Get help right away if you have trouble breathing or new-onset confusion at home. This information is not intended to replace advice given to you by your health care provider. Make sure you discuss any questions you have with your health care provider. Document Revised: 09/27/2019 Document Reviewed: 04/25/2019 Elsevier Patient Education  [place].     Bone Marrow Aspiration and Bone Marrow Biopsy, Adult, Care After This sheet gives you information about how to care for yourself after your procedure. Your health care provider may also give you more specific instructions. If you have problems or questions, contact your health careprovider. What can I expect after the procedure? After the procedure, it is common to have: Mild pain and tenderness. Swelling. Bruising. Follow these instructions at home: Puncture site care Follow instructions from your health care provider about how to take care of the puncture site. Make sure you: Wash your hands with soap and water before and after you change your bandage (dressing). If soap and water are not available, use hand sanitizer. Change your dressing as told by your health care provider. Check your puncture site every day for signs of infection. Check for: More redness, swelling, or  pain. Fluid or blood. Warmth. Pus or a bad smell.  Activity Return to your normal activities as told by your health care provider. Ask your health care provider what activities are safe for you. Do not lift anything that is heavier than 10 lb (4.5 kg), or  the limit that you are told, until your health care provider says that it is safe. Do not drive for 24 hours if you were given a sedative during your procedure. General instructions Take over-the-counter and prescription medicines only as told by your health care provider. Do not take baths, swim, or use a hot tub until your health care provider approves. Ask your health care provider if you may take showers. You may only be allowed to take sponge baths. If directed, put ice on the affected area. To do this: Put ice in a plastic bag. Place a towel between your skin and the bag. Leave the ice on for 20 minutes, 2-3 times a day. Keep all follow-up visits as told by your health care provider. This is important.  Contact a health care provider if: Your pain is not controlled with medicine. You have a fever. You have more redness, swelling, or pain around the puncture site. You have fluid or blood coming from the puncture site. Your puncture site feels warm to the touch. You have pus or a bad smell coming from the puncture site. Summary After the procedure, it is common to have mild pain, tenderness, swelling, and bruising. Follow instructions from your health care provider about how to take care of the puncture site and what activities are safe for you. Take over-the-counter and prescription medicines only as told by your health care provider. Contact a health care provider if you have any signs of infection, such as fluid or blood coming from the puncture site. This information is not intended to replace advice given to you by your health care provider. Make sure you discuss any questions you have with your healthcare provider. Document Revised: 10/16/2018 Document Reviewed: 10/16/2018 Elsevier Patient Education  [place].

## 2022-01-17 NOTE — Progress Notes (Signed)
4604 Lab called to report a critical HCT of 6.8. Reported the lab result to Nash Mantis who was on the unit at the time of the call.

## 2022-01-17 NOTE — Progress Notes (Signed)
1120 IV left for saline well to go over to the [facility] for his appointment.

## 2022-01-17 NOTE — Progress Notes (Signed)
Hgb 10.5 on H/H redraw.  Per Dede Query, PA blood not needed and patient can be discharged.  Patient discharged via wheelchair.  No s/s or c/o distress or discomfort.

## 2022-01-17 NOTE — Procedures (Signed)
Vascular and Interventional Radiology Procedure Note  Patient: Micheal Walls DOB: Nov 29, 1935 Medical Record Number: [id number] Note Date/Time: 01/17/22 9:20 AM   Performing Physician: Michaelle Birks, MD Assistant(s): None  Diagnosis: Anemia. MGUS  Procedure: BONE MARROW ASPIRATION and BIOPSY  Anesthesia: Conscious Sedation Complications: None Estimated Blood Loss: Minimal Specimens: Sent for Pathology  Findings:  Successful CT-guided bone marrow aspiration and biopsy A total of 1 cores were obtained. Hemostasis of the tract was achieved using Manual Pressure.  Plan: Bed rest for 1 hours.  See detailed procedure note with images in PACS. The patient tolerated the procedure well without incident or complication and was returned to Recovery in stable condition.    Michaelle Birks, MD [facility]   Pager. [place]

## 2022-01-17 NOTE — Consult Note (Signed)
Chief Complaint: Patient was seen in consultation today for CT guided bone marrow biopsy  Referring Physician(s): Dorsey,J  Supervising Physician: Michaelle Birks  Patient Status: [facility] - Out-pt  History of Present Illness: Micheal Walls is an 86 y.o. male with past medical history significant for heart failure, diverticulosis, diabetes, CKD, gout, hyperlipidemia, stroke, PAD, sleep apnea, right bundle branch block, hypertension, right BKA, left toe amputation, and chronic normocytic anemia who presents now with MGUS.  He is scheduled today for CT-guided bone marrow biopsy for further evaluation.  Past Medical History:  Diagnosis Date   Acute metabolic encephalopathy 11/16/4401   Amputated below knee [facility])    right   Anemia    Aspiration pneumonia ([place]) 4/74/2595   Diastolic heart failure (HCC)    Diverticulosis    DM (diabetes mellitus) ([place])    Gout    Hyperlipemia    OSA on CPAP    RBBB    Sepsis ([place]) 11/02/2019   Systemic hypertension     Past Surgical History:  Procedure Laterality Date   ABDOMINAL AORTOGRAM W/LOWER EXTREMITY Left 08/11/2020   Procedure: ABDOMINAL AORTOGRAM W/LOWER EXTREMITY;  Surgeon: Serafina Mitchell, MD;  Location: [place] [facility];  Service: Cardiovascular;  Laterality: Left;   ABDOMINAL AORTOGRAM W/LOWER EXTREMITY N/A 02/26/2021   Procedure: ABDOMINAL AORTOGRAM W/LOWER EXTREMITY;  Surgeon: Cherre Robins, MD;  Location: [place] [facility];  Service: Cardiovascular;  Laterality: N/A;   AMPUTATION Left 09/14/2020   Procedure: AMPUTATION LEFT GREAT TOE;  Surgeon: [name], DPM;  Location: [place];  Service: Podiatry;  Laterality: Left;   AMPUTATION TOE Left 12/03/2021   Procedure: AMPUTATION TOE, LEFT second;  Surgeon: Lorenda Peck, DPM;  Location: [place];  Service: Podiatry;  Laterality: Left;   LEG AMPUTATION BELOW KNEE     right   NM MYOCAR PERF WALL MOTION  09/18/2009   no ischemia   PERIPHERAL VASCULAR BALLOON ANGIOPLASTY Left 08/11/2020    Procedure: PERIPHERAL VASCULAR BALLOON ANGIOPLASTY;  Surgeon: Serafina Mitchell, MD;  Location: [place] [facility];  Service: Cardiovascular;  Laterality: Left;  AT   PERIPHERAL VASCULAR INTERVENTION Left 02/26/2021   Procedure: PERIPHERAL VASCULAR INTERVENTION;  Surgeon: Cherre Robins, MD;  Location: [place] [facility];  Service: Cardiovascular;  Laterality: Left;    Allergies: Vancomycin  Medications: Prior to Admission medications   Medication Sig Start Date End Date Taking? Authorizing Provider  allopurinol (ZYLOPRIM) 100 MG tablet Take 100 mg by mouth 2 (two) times daily.   Yes [provider]  aspirin EC 81 MG tablet Take 81 mg by mouth daily. Swallow whole.   Yes [provider]  colchicine 0.6 MG tablet Take 0.6 mg by mouth once a week. 11/16/20  Yes [provider]  Continuous Blood Gluc Sensor (FREESTYLE LIBRE 14 DAY SENSOR) MISC Use as directed to check blood sugars 08/02/21  Yes [name], [name]  diltiazem (CARDIZEM CD) 240 MG 24 hr capsule TAKE ONE CAPSULE BY MOUTH ONCE DAILY 01/05/22  Yes [name], [name], MD  dorzolamide (TRUSOPT) 2 % ophthalmic solution Place 1 drop into both eyes 2 (two) times daily.   Yes [provider]  ferrous sulfate 325 (65 FE) MG tablet Take 1 tablet (325 mg total) by mouth 2 (two) times daily with a meal. 12/04/21  Yes Nita Sells, MD  furosemide (LASIX) 40 MG tablet Take 1 tablet in the morning and one in the evening Patient taking differently: Take 40 mg by mouth 2 (  two) times daily. 07/14/21  Yes [name], [name]  gabapentin (NEURONTIN) 100 MG capsule TAKE TWO CAPSULES BY MOUTH EVERY MORNING and TAKE ONE CAPSULE BY MOUTH AT NOON and TAKE ONE CAPSULE BY MOUTH EVERY EVENING Patient taking differently: Take 100-200 mg by mouth See admin instructions. Take two capsules by mouth every morning and then take one capsule by mouth at noon and one capsule by mouth every evening. 10/11/21  Yes [name], [name]   glucose blood (FREESTYLE PRECISION NEO TEST) test strip Use as instructed 07/30/19  Yes [name], [name]  ketotifen (ZADITOR) 0.025 % ophthalmic solution Place 1 drop into both eyes 2 (two) times daily.   Yes [provider]  latanoprost (XALATAN) 0.005 % ophthalmic solution Place 1 drop into both eyes at bedtime.  10/12/18  Yes [provider]  Multiple Vitamins-Minerals (MULTIVITAMIN WITH MINERALS) tablet Take 1 tablet by mouth daily.   Yes [provider]  Multiple Vitamins-Minerals (PRESERVISION AREDS) CAPS Take 2 capsules by mouth daily.   Yes [provider]  oxyCODONE (ROXICODONE) 5 MG immediate release tablet Take 1 tablet (5 mg total) by mouth every 4 (four) hours as needed for severe pain. 12/06/21  Yes Lorenda Peck, DPM  pantoprazole (PROTONIX) 40 MG tablet Take 1 tablet (40 mg total) by mouth daily. 07/14/21  Yes [name], [name]  Semaglutide, [name], (OZEMPIC, [name],) 2 MG/1.5ML SOPN Inject 1 mg into the skin once a week. 12/17/21  Yes [name], [name]  senna-docusate (SENOKOT-S) 8.6-50 MG tablet Take 1 tablet by mouth 2 (two) times daily between meals as needed for mild constipation or moderate constipation. Patient taking differently: Take 1 tablet by mouth 2 (two) times daily as needed (constipation). 10/24/20  Yes Mercy Riding, MD  SYNTHROID 25 MCG tablet Take 1 tablet by mouth Monday - Friday Patient taking differently: Take 25 mcg by mouth See admin instructions. Take 25 mcg by mouth Monday through Friday. 07/14/21  Yes [name], [name]  tamsulosin (FLOMAX) 0.4 MG CAPS capsule Take 0.4 mg by mouth daily.   Yes [provider]  rosuvastatin (CRESTOR) 20 MG tablet Take 1 tablet (20 mg total) by mouth daily. 05/20/21   [name], [name], MD     Family History  Problem Relation Age of Onset   Diabetes Mother    Heart attack Father    Cancer Sister     Social History   Socioeconomic History   Marital status: Married     Spouse name: Not on file   Number of children: Not on file   Years of education: Not on file   Highest education level: Not on file  Occupational History   Occupation: retired  Tobacco Use   Smoking status: Former    Types: Cigars    Quit date: 1997    Years since quitting: 26.6   Smokeless tobacco: Never  Vaping Use   Vaping Use: Former  Substance and Sexual Activity   Alcohol use: No    Alcohol/week: 0.0 standard drinks of alcohol   Drug use: No   Sexual activity: Not Currently  Other Topics Concern   Not on file  Social History Narrative   Not on file   Social Determinants of Health   Financial Resource Strain: [place]  (05/20/2021)   Overall Financial Resource Strain (CARDIA)    Difficulty of Paying Living Expenses: Not hard at all  Food Insecurity: No Food Insecurity (01/11/2022)   Hunger Vital Sign    Worried  About Running Out of Food in the Last Year: Never true    Ran Out of Food in the Last Year: Never true  Transportation Needs: No Transportation Needs (01/11/2022)   PRAPARE - Hydrologist (Medical): No    Lack of Transportation (Non-Medical): No  Physical Activity: Inactive (05/20/2021)   Exercise Vital Sign    Days of Exercise per Week: 0 days    Minutes of Exercise per Session: 0 min  Stress: No Stress Concern Present (05/20/2021)   [place]    Feeling of Stress : Not at all  Social Connections: Not on file      Review of Systems currently denies fever, headache, chest pain, worsening dyspnea, abdominal/back pain, nausea, vomiting or bleeding.  He does have occasional cough.  Vital Signs: Vitals:   01/17/22 0737  BP: (!) 154/69  Pulse: 90  Resp: 18  Temp: 97.7 F (36.5 C)  SpO2: 100%         Physical Exam awake, alert; chest with slightly diminished breath sounds bases.  Heart with regular rate and rhythm.  Abdomen soft, positive bowel sounds,  nontender.  Right BKA, edema noted left lower extremity- area currently bandaged.  Imaging: DG Bone Survey Met  Result Date: 12/31/2021 CLINICAL DATA:  Monoclonal gammopathy, evaluate for lytic lesions. EXAM: METASTATIC BONE SURVEY COMPARISON:  None Available. FINDINGS: No lytic, sclerotic, or destructive lesion is seen within the bones. Extensive vascular calcifications are noted in the soft tissues. No acute fracture or dislocation. Degenerative changes are present in the cervical, thoracic, and lumbar spine, bilateral hips, shoulders and knees. There are changes of below-the-knee amputation on the right with multiple surgical clips in the soft tissues. Atherosclerotic calcification of the aorta is noted. Soft tissue swelling is present about the left lower extremity. IMPRESSION: 1. No lytic or destructive lesion in the bones. 2. Status post below-the-knee amputation on the right. 3. Scattered degenerative changes as described above. Electronically Signed   By: Brett Fairy M.D.   On: 12/31/2021 02:44    Labs:  CBC: Recent Labs    11/17/21 1040 12/02/21 1922 12/03/21 0303 12/13/21 0903  WBC 5.9 6.3 5.5 6.4  HGB 10.2* 9.6* 9.9* 10.5*  HCT 30.9* 29.5* 29.2* 31.5*  PLT 194 229 212 222    COAGS: Recent Labs    12/02/21 1922  INR 1.0    BMP: Recent Labs    11/17/21 1040 12/02/21 1922 12/03/21 0303 12/13/21 0903  NA 137 135 140 137  K 3.7 4.1 4.0 4.2  CL 100 100 103 103  CO2 22 25 27 27   GLUCOSE 133* 112* 106* 104*  BUN 56* 68* 67* 59*  CALCIUM 9.6 9.5 9.7 10.6*  CREATININE 1.78* 2.10* 1.96* 2.03*  GFRNONAA  --  30* 33* 32*    LIVER FUNCTION TESTS: Recent Labs    05/11/21 1152 12/02/21 1922 12/13/21 0903  BILITOT 0.3 0.4 0.4  AST 20 43* 31  ALT 26 48* 44  ALKPHOS 87 79 94  PROT 6.8 6.9 8.1  ALBUMIN 3.7 3.0* 3.8    TUMOR MARKERS: No results for input(s): "AFPTM", "CEA", "CA199", "CHROMGRNA" in the last 8760 hours.  Assessment and Plan: 86 y.o. male with  past medical history significant for heart failure, diverticulosis, diabetes, CKD, gout, hyperlipidemia, stroke, PAD, sleep apnea, right bundle branch block, hypertension, right BKA, left toe amputation, and chronic normocytic anemia who presents now with MGUS.  He is  scheduled today for CT-guided bone marrow biopsy for further evaluation.Risks and benefits of procedure was discussed with the patient  including, but not limited to bleeding, infection, damage to adjacent structures or low yield requiring additional tests.  All of the questions were answered and there is agreement to proceed.  Consent signed and in chart.    Thank you for this interesting consult.  I greatly enjoyed meeting REVERE MAAHS and look forward to participating in their care.  A copy of this report was sent to the requesting provider on this date.  Electronically Signed: D. Rowe Robert, PA-C 01/17/2022, 7:56 AM   I spent a total of  20 minutes   in face to face in clinical consultation, greater than 50% of which was counseling/coordinating care for CT-guided bone marrow biopsy

## 2022-01-19 LAB — UPEP/UIFE/LIGHT CHAINS/TP, 24-HR UR
% BETA, Urine: 29.9 %
ALPHA 1 URINE: 1.8 %
Albumin, U: 59.1 %
Alpha 2, Urine: 2.9 %
Free Kappa Lt Chains,Ur: 80.36 mg/L (ref 1.17–86.46)
Free Kappa/Lambda Ratio: 3.29 (ref 1.83–14.26)
Free Lambda Lt Chains,Ur: 24.41 mg/L — ABNORMAL HIGH (ref 0.27–15.21)
GAMMA GLOBULIN URINE: 6.3 %
Total Protein, Urine-Ur/day: 733 mg/24 hr — ABNORMAL HIGH (ref 30–150)
Total Protein, Urine: 43.1 mg/dL
Total Volume: 1700

## 2022-01-20 ENCOUNTER — Ambulatory Visit (INDEPENDENT_AMBULATORY_CARE_PROVIDER_SITE_OTHER): Payer: Medicare Other | Admitting: Podiatry

## 2022-01-20 DIAGNOSIS — L97522 Non-pressure chronic ulcer of other part of left foot with fat layer exposed: Secondary | ICD-10-CM

## 2022-01-21 ENCOUNTER — Other Ambulatory Visit: Payer: Medicare Other

## 2022-01-21 ENCOUNTER — Inpatient Hospital Stay (HOSPITAL_BASED_OUTPATIENT_CLINIC_OR_DEPARTMENT_OTHER): Payer: Medicare Other | Admitting: Physician Assistant

## 2022-01-21 ENCOUNTER — Other Ambulatory Visit: Payer: Self-pay

## 2022-01-21 VITALS — HR 72 | Temp 97.7°F | Resp 18 | Wt 241.4 lb

## 2022-01-21 DIAGNOSIS — D472 Monoclonal gammopathy: Secondary | ICD-10-CM

## 2022-01-21 DIAGNOSIS — N183 Chronic kidney disease, stage 3 unspecified: Secondary | ICD-10-CM | POA: Diagnosis not present

## 2022-01-21 DIAGNOSIS — Z87891 Personal history of nicotine dependence: Secondary | ICD-10-CM | POA: Diagnosis not present

## 2022-01-21 DIAGNOSIS — D649 Anemia, unspecified: Secondary | ICD-10-CM | POA: Diagnosis not present

## 2022-01-21 LAB — TYPE AND SCREEN
ABO/RH(D): B POS
Antibody Screen: NEGATIVE
Unit division: 0

## 2022-01-21 LAB — BPAM RBC
Blood Product Expiration Date: 202309042359
Unit Type and Rh: 7300

## 2022-01-21 NOTE — Progress Notes (Unsigned)
[facility] [phone]   [phone]  PROGRESS NOTE  Patient Care Team: [name], [name] as PCP - General ([place]) [name], [name], MD as PCP - Cardiology (Cardiology) Little, Claudette Stapler, RN as Carl Management Pearson, Sharyn Blitz, [facility] (Pharmacist)  CHIEF COMPLAINTS/PURPOSE OF CONSULTATION:  MGUS  ONCOLOGIC HISTORY:   HISTORY OF PRESENTING ILLNESS:  Micheal Walls 86 y.o. male with medical history significant for diabetes, hypertension, hyperlipidemia, obstructive sleep apnea on CPAP, peripheral artery disease, status post right lower extremity amputation and left toe amputation, stroke, gout and diastolic heart failure.  He is unaccompanied for this visit.  On review of the previous records, Micheal Walls has evidence of anemia over several years.  His most recent labs from 12/03/2021 showed his hemoglobin was 9.9, MCV 86.9.  On exam today, Micheal Walls reports he does have fatigue that has been chronic but he can complete most of his activities on his own.  He has a good appetite but endorses weight loss after starting on Ozempic injections through his PCP.  He denies any nausea, vomiting or abdominal pain.  His bowel habits are unchanged without any recurrent episodes of diarrhea or constipation.  He denies easy bruising or signs of active bleeding.  He recently underwent amputation of left second toe due to cellulitis/osteomyelitis with underlying PAD.  He denies any fevers, chills, night sweats, shortness of breath, chest pain or cough.  He has no other complaints.  Rest of the 10 point ROS is below.  MEDICAL HISTORY:  Past Medical History:  Diagnosis Date   Acute metabolic encephalopathy 0/08/7046   Amputated below knee [facility])    right   Anemia    Aspiration pneumonia ([place]) 8/89/1694   Diastolic heart failure (HCC)    Diverticulosis    DM (diabetes mellitus) ([place])    Gout    Hyperlipemia    OSA on CPAP    RBBB     Sepsis ([place]) 11/02/2019   Systemic hypertension     SURGICAL HISTORY: Past Surgical History:  Procedure Laterality Date   ABDOMINAL AORTOGRAM W/LOWER EXTREMITY Left 08/11/2020   Procedure: ABDOMINAL AORTOGRAM W/LOWER EXTREMITY;  Surgeon: Serafina Mitchell, MD;  Location: [place] [facility];  Service: Cardiovascular;  Laterality: Left;   ABDOMINAL AORTOGRAM W/LOWER EXTREMITY N/A 02/26/2021   Procedure: ABDOMINAL AORTOGRAM W/LOWER EXTREMITY;  Surgeon: Cherre Robins, MD;  Location: [place] [facility];  Service: Cardiovascular;  Laterality: N/A;   AMPUTATION Left 09/14/2020   Procedure: AMPUTATION LEFT GREAT TOE;  Surgeon: [name], DPM;  Location: [place];  Service: Podiatry;  Laterality: Left;   AMPUTATION TOE Left 12/03/2021   Procedure: AMPUTATION TOE, LEFT second;  Surgeon: Lorenda Peck, DPM;  Location: [place];  Service: Podiatry;  Laterality: Left;   LEG AMPUTATION BELOW KNEE     right   NM MYOCAR PERF WALL MOTION  09/18/2009   no ischemia   PERIPHERAL VASCULAR BALLOON ANGIOPLASTY Left 08/11/2020   Procedure: PERIPHERAL VASCULAR BALLOON ANGIOPLASTY;  Surgeon: Serafina Mitchell, MD;  Location: [place] [facility];  Service: Cardiovascular;  Laterality: Left;  AT   PERIPHERAL VASCULAR INTERVENTION Left 02/26/2021   Procedure: PERIPHERAL VASCULAR INTERVENTION;  Surgeon: Cherre Robins, MD;  Location: [place] [facility];  Service: Cardiovascular;  Laterality: Left;    SOCIAL HISTORY: Social History   Socioeconomic History   Marital status: Married    Spouse name: Not on file   Number of children: Not  on file   Years of education: Not on file   Highest education level: Not on file  Occupational History   Occupation: retired  Tobacco Use   Smoking status: Former    Types: Cigars    Quit date: 1997    Years since quitting: 26.6   Smokeless tobacco: Never  Vaping Use   Vaping Use: Former  Substance and Sexual Activity   Alcohol use: No    Alcohol/week: 0.0 standard drinks of  alcohol   Drug use: No   Sexual activity: Not Currently  Other Topics Concern   Not on file  Social History Narrative   Not on file   Social Determinants of Health   Financial Resource Strain: Low Risk  (05/20/2021)   Overall Financial Resource Strain (CARDIA)    Difficulty of Paying Living Expenses: Not hard at all  Food Insecurity: No Food Insecurity (01/11/2022)   Hunger Vital Sign    Worried About Running Out of Food in the Last Year: Never true    [place] in the Last Year: Never true  Transportation Needs: No Transportation Needs (01/11/2022)   PRAPARE - Hydrologist (Medical): No    Lack of Transportation (Non-Medical): No  Physical Activity: Inactive (05/20/2021)   Exercise Vital Sign    Days of Exercise per Week: 0 days    Minutes of Exercise per Session: 0 min  Stress: No Stress Concern Present (05/20/2021)   [place]    Feeling of Stress : Not at all  Social Connections: Not on file  Intimate Partner Violence: Unknown (05/15/2019)   Humiliation, Afraid, Rape, and Kick questionnaire    Fear of Current or Ex-Partner: No    Emotionally Abused: Not on file    Physically Abused: Not on file    Sexually Abused: Not on file    FAMILY HISTORY: Family History  Problem Relation Age of Onset   Diabetes Mother    Heart attack Father    Cancer Sister     ALLERGIES:  is allergic to vancomycin.  MEDICATIONS:  Current Outpatient Medications  Medication Sig Dispense Refill   allopurinol (ZYLOPRIM) 100 MG tablet Take 100 mg by mouth 2 (two) times daily.     aspirin EC 81 MG tablet Take 81 mg by mouth daily. Swallow whole.     colchicine 0.6 MG tablet Take 0.6 mg by mouth once a week.     Continuous Blood Gluc Sensor (FREESTYLE LIBRE 14 DAY SENSOR) MISC Use as directed to check blood sugars 6 each 1   diltiazem (CARDIZEM CD) 240 MG 24 hr capsule TAKE ONE CAPSULE BY MOUTH ONCE  DAILY 90 capsule 3   dorzolamide (TRUSOPT) 2 % ophthalmic solution Place 1 drop into both eyes 2 (two) times daily.     ferrous sulfate 325 (65 FE) MG tablet Take 1 tablet (325 mg total) by mouth 2 (two) times daily with a meal.  3   furosemide (LASIX) 40 MG tablet Take 1 tablet (40 mg total) by mouth 2 (two) times daily. 180 tablet 1   furosemide (LASIX) 40 MG tablet Take 1 tablet in the morning and one in the evening 180 tablet 1   gabapentin (NEURONTIN) 100 MG capsule TAKE TWO CAPSULES BY MOUTH EVERY MORNING and TAKE ONE CAPSULE BY MOUTH AT NOON and TAKE ONE CAPSULE BY MOUTH EVERY EVENING (Patient taking differently: Take 100-200 mg by mouth See admin instructions. Take  two capsules by mouth every morning and then take one capsule by mouth at noon and one capsule by mouth every evening.) 360 capsule 1   glucose blood (FREESTYLE PRECISION NEO TEST) test strip Use as instructed 100 each 12   ketotifen (ZADITOR) 0.025 % ophthalmic solution Place 1 drop into both eyes 2 (two) times daily.     latanoprost (XALATAN) 0.005 % ophthalmic solution Place 1 drop into both eyes at bedtime.      Multiple Vitamins-Minerals (MULTIVITAMIN WITH MINERALS) tablet Take 1 tablet by mouth daily.     Multiple Vitamins-Minerals (PRESERVISION AREDS) CAPS Take 2 capsules by mouth daily.     pantoprazole (PROTONIX) 40 MG tablet TAKE ONE TABLET BY MOUTH EVERYDAY AT BEDTIME 90 tablet 1   rosuvastatin (CRESTOR) 20 MG tablet Take 1 tablet (20 mg total) by mouth daily. 90 tablet 3   Semaglutide, [name], (OZEMPIC, [name],) 2 MG/1.5ML SOPN Inject 1 mg into the skin once a week. 9 mL 1   senna-docusate (SENOKOT-S) 8.6-50 MG tablet Take 1 tablet by mouth 2 (two) times daily between meals as needed for mild constipation or moderate constipation. (Patient taking differently: Take 1 tablet by mouth 2 (two) times daily as needed (constipation).) 60 tablet 0   SYNTHROID 25 MCG tablet Take 1 tablet by mouth Monday - Friday (Patient  taking differently: Take 25 mcg by mouth See admin instructions. Take 25 mcg by mouth Monday through Friday.) 60 tablet 2   tamsulosin (FLOMAX) 0.4 MG CAPS capsule Take 0.4 mg by mouth daily.     No current facility-administered medications for this visit.    REVIEW OF SYSTEMS:   Constitutional: ( - ) fevers, ( - )  chills , ( - ) night sweats Eyes: ( - ) blurriness of vision, ( - ) double vision, ( - ) watery eyes Ears, nose, mouth, throat, and face: ( - ) mucositis, ( - ) sore throat Respiratory: ( - ) cough, ( - ) dyspnea, ( - ) wheezes Cardiovascular: ( - ) palpitation, ( - ) chest discomfort, ( - ) lower extremity swelling Gastrointestinal:  ( - ) nausea, ( - ) heartburn, ( - ) change in bowel habits Skin: ( - ) abnormal skin rashes Lymphatics: ( - ) new lymphadenopathy, ( - ) easy bruising Neurological: ( - ) numbness, ( - ) tingling, ( - ) new weaknesses Behavioral/Psych: ( - ) mood change, ( - ) new changes  All other systems were reviewed with the patient and are negative.  PHYSICAL EXAMINATION: ECOG PERFORMANCE STATUS: 1 - Symptomatic but completely ambulatory  Vitals:   01/21/22 1235  Pulse: 72  Resp: 18  Temp: 97.7 F (36.5 C)  SpO2: 100%   Filed Weights   01/21/22 1235  Weight: 241 lb 6.4 oz (109.5 kg)    GENERAL: well appearing male in NAD, patient was in wheelchair during visit.   SKIN: skin color, texture, turgor are normal, no rashes or significant lesions EYES: conjunctiva are pink and non-injected, sclera clear LUNGS: clear to auscultation and percussion with normal breathing effort HEART: regular rate & rhythm and no murmurs and no lower extremity edema PSYCH: alert & oriented x 3, fluent speech NEURO: no focal motor/sensory deficits  LABORATORY DATA:  I have reviewed the data as listed    Latest Ref Rng & Units 01/17/2022   12:42 PM 01/17/2022   11:44 AM 01/17/2022    7:30 AM  CBC  WBC 4.0 - 10.5 K/uL  6.0  SPECIMEN CONTAMINATED, UNABLE TO PERFORM  TEST(S).  C  Hemoglobin 13.0 - 17.0 g/dL 10.5  10.7  SPECIMEN CONTAMINATED, UNABLE TO PERFORM TEST(S).  C  Hematocrit 39.0 - 52.0 % 31.6  32.0  SPECIMEN CONTAMINATED, UNABLE TO PERFORM TEST(S).  C  Platelets 150 - 400 K/uL  170  SPECIMEN CONTAMINATED, UNABLE TO PERFORM TEST(S).  C    C Corrected result       Latest Ref Rng & Units 12/13/2021    9:03 AM 12/03/2021    3:03 AM 12/02/2021    7:22 PM  CMP  Glucose 70 - 99 mg/dL 104  106  112   BUN 8 - 23 mg/dL 59  67  68   Creatinine 0.61 - 1.24 mg/dL 2.03  1.96  2.10   Sodium 135 - 145 mmol/L 137  140  135   Potassium 3.5 - 5.1 mmol/L 4.2  4.0  4.1   Chloride 98 - 111 mmol/L 103  103  100   CO2 22 - 32 mmol/L 27  27  25    Calcium 8.9 - 10.3 mg/dL 10.6  9.7  9.5   Total Protein 6.5 - 8.1 g/dL 8.1   6.9   Total Bilirubin 0.3 - 1.2 mg/dL 0.4   0.4   Alkaline Phos 38 - 126 U/L 94   79   AST 15 - 41 U/L 31   43   ALT 0 - 44 U/L 44   48     RADIOGRAPHIC STUDIES: I have personally reviewed the radiological images as listed and agreed with the findings in the report. CT Biopsy  Result Date: 01/17/2022 INDICATION: Anemia.  MGUS. EXAM: CT GUIDED BONE MARROW ASPIRATION AND CORE BIOPSY MEDICATIONS: None. ANESTHESIA/SEDATION: Moderate (conscious) sedation was employed during this procedure. A total of 1 milligrams versed and 75 micrograms fentanyl were administered intravenously. The patient's level of consciousness and vital signs were monitored continuously by radiology nursing throughout the procedure under my direct supervision. Total monitored sedation time: 12 minutes FLUOROSCOPY TIME:  CT dose in mGy was not provided. RADIATION DOSE REDUCTION: This exam was performed according to the departmental dose-optimization program which includes automated exposure control, adjustment of the mA and/or kV according to patient size and/or use of iterative reconstruction technique. COMPLICATIONS: None immediate. Estimated blood loss: <5 mL PROCEDURE: Informed  written consent was obtained from the the patient and/or patient's representative after a thorough discussion of the procedural risks, benefits and alternatives. All questions were addressed. Maximal Sterile Barrier Technique was utilized including caps, mask, sterile gowns, sterile gloves, sterile drape, hand hygiene and skin antiseptic. A timeout was performed prior to the initiation of the procedure. The patient was positioned prone and non-contrast localization CT was performed of the pelvis to demonstrate the iliac marrow spaces. Maximal barrier sterile technique utilized including caps, mask, sterile gowns, sterile gloves, large sterile drape, hand hygiene, and chlorhexidine prep. Under sterile conditions and local anesthesia, an 11 gauge coaxial bone biopsy needle was advanced into the RIGHT iliac marrow space. Needle position was confirmed with CT imaging. Initially, bone marrow aspiration was performed. Next, the 11 gauge outer cannula was utilized to obtain a 1 iliac bone marrow core biopsy. Needle was removed. Hemostasis was obtained with compression. The patient tolerated the procedure well. Samples were prepared with the cytotechnologist. IMPRESSION: Successful CT-guided bone marrow aspiration biopsy, as above. Michaelle Birks, MD [facility] Radiology Electronically Signed   By: Francesco Runner.D.  On: 01/17/2022 10:32   CT BONE MARROW BIOPSY & ASPIRATION  Result Date: 01/17/2022 INDICATION: Anemia.  MGUS. EXAM: CT GUIDED BONE MARROW ASPIRATION AND CORE BIOPSY MEDICATIONS: None. ANESTHESIA/SEDATION: Moderate (conscious) sedation was employed during this procedure. A total of 1 milligrams versed and 75 micrograms fentanyl were administered intravenously. The patient's level of consciousness and vital signs were monitored continuously by radiology nursing throughout the procedure under my direct supervision. Total monitored sedation time: 12 minutes  FLUOROSCOPY TIME:  CT dose in mGy was not provided. RADIATION DOSE REDUCTION: This exam was performed according to the departmental dose-optimization program which includes automated exposure control, adjustment of the mA and/or kV according to patient size and/or use of iterative reconstruction technique. COMPLICATIONS: None immediate. Estimated blood loss: <5 mL PROCEDURE: Informed written consent was obtained from the the patient and/or patient's representative after a thorough discussion of the procedural risks, benefits and alternatives. All questions were addressed. Maximal Sterile Barrier Technique was utilized including caps, mask, sterile gowns, sterile gloves, sterile drape, hand hygiene and skin antiseptic. A timeout was performed prior to the initiation of the procedure. The patient was positioned prone and non-contrast localization CT was performed of the pelvis to demonstrate the iliac marrow spaces. Maximal barrier sterile technique utilized including caps, mask, sterile gowns, sterile gloves, large sterile drape, hand hygiene, and chlorhexidine prep. Under sterile conditions and local anesthesia, an 11 gauge coaxial bone biopsy needle was advanced into the RIGHT iliac marrow space. Needle position was confirmed with CT imaging. Initially, bone marrow aspiration was performed. Next, the 11 gauge outer cannula was utilized to obtain a 1 iliac bone marrow core biopsy. Needle was removed. Hemostasis was obtained with compression. The patient tolerated the procedure well. Samples were prepared with the cytotechnologist. IMPRESSION: Successful CT-guided bone marrow aspiration biopsy, as above. Michaelle Birks, MD [facility] Radiology Electronically Signed   By: Michaelle Birks M.D.   On: 01/17/2022 10:32   DG Bone Survey Met  Result Date: 12/31/2021 CLINICAL DATA:  Monoclonal gammopathy, evaluate for lytic lesions. EXAM: METASTATIC BONE SURVEY COMPARISON:   None Available. FINDINGS: No lytic, sclerotic, or destructive lesion is seen within the bones. Extensive vascular calcifications are noted in the soft tissues. No acute fracture or dislocation. Degenerative changes are present in the cervical, thoracic, and lumbar spine, bilateral hips, shoulders and knees. There are changes of below-the-knee amputation on the right with multiple surgical clips in the soft tissues. Atherosclerotic calcification of the aorta is noted. Soft tissue swelling is present about the left lower extremity. IMPRESSION: 1. No lytic or destructive lesion in the bones. 2. Status post below-the-knee amputation on the right. 3. Scattered degenerative changes as described above. Electronically Signed   By: Brett Fairy M.D.   On: 12/31/2021 02:44    ASSESSMENT & PLAN Micheal Walls is a 86 y.o. male who presents to the hematology clinic for initial evaluation for chronic normocytic anemia.  Patient has several chronic conditions including stage III chronic kidney disease that is most likely contributing to his anemia.  We will proceed with logic work-up to rule out other causes including nutritional deficiencies, paraproteinemia and bone marrow disorders.  #Chronic normocytic anemia: --Likely secondary to chronic kidney disease, stage III.  --Labs today to check CBC, CMP, vitamin B12, MMA, folate, ferritin, iron and TIBC, reticulocyte panel, erythropoietin, SPEP with IFE, serum free light chains, peripheral smear. --If anemia is confirmed to be due to chronic kidney disease, we will discuss EPO injections  with his nephrologist, Dr. Corliss Parish.  --Consider bone marrow biopsy based on above workup. --RTC based on above workup   No orders of the defined types were placed in this encounter.   All questions were answered. The patient knows to call the clinic with any problems, questions or concerns.  I have spent a total of 60 minutes minutes of face-to-face and  non-face-to-face time, preparing to see the patient, obtaining and/or reviewing separately obtained history, performing a medically appropriate examination, counseling and educating the patient, ordering tests/procedures,  documenting clinical information in the electronic health record,  and care coordination.   Dede Query, PA-C Department of Hematology/Oncology [place] [facility] Phone: [phone]

## 2022-01-24 DIAGNOSIS — D472 Monoclonal gammopathy: Secondary | ICD-10-CM | POA: Insufficient documentation

## 2022-01-24 NOTE — Progress Notes (Signed)
  Subjective:  Patient ID: Micheal Walls, male    DOB: March 01, 1936,  MRN: [id number]  Chief Complaint  Patient presents with   Foot Ulcer    RM 3 Follow up left foot ulcer check. PT states he is doing well.     DOS: 12/03/1926 Procedure: Amputation left second toe  86 y.o. male returns for post-op check.  Overall doing okay using gentamicin  Review of Systems: Negative except as noted in the HPI. Denies N/V/F/Ch.   Objective:  There were no vitals filed for this visit. There is no height or weight on file to calculate BMI. Constitutional Well developed. Well nourished.  Vascular Foot warm and well perfused. Capillary refill normal to all digits.  Calf is soft and supple, no posterior calf or knee pain, negative Homans' sign  Neurologic Normal speech. Oriented to person, place, and time. Epicritic sensation to light touch grossly absent bilaterally.  Dermatologic Residual ulceration post suture removal measures 1.4 x 0.4 x 0.5, granular wound bed no signs of infection exposed subcutaneous tissue  Orthopedic: He has no tenderness to palpation noted about the surgical site.   Pathology results showed osteomyelitis with no residual infection noted at margin     Assessment:   No diagnosis found.  Plan:  Patient was evaluated and treated and all questions answered.  Ulcer left foot -We discussed the etiology and factors that are a part of the wound healing process.  We also discussed the risk of infection both soft tissue and osteomyelitis from open ulceration.  Discussed the risk of limb loss if this happens or worsens. -Debridement as below. -Dressed with Iodosorb, DSD. -Continue home dressing changes daily with 4 x 4 gauze and gentamicin -Continue off-loading with surgical shoe.   Procedure: Excisional Debridement of Wound Rationale: Removal of non-viable soft tissue from the wound to promote healing.  Anesthesia: none Post-Debridement Wound Measurements: 1.4 x 0.4  x 0.5 Type of Debridement: Sharp Excisional Tissue Removed: Non-viable soft tissue Depth of Debridement: subcutaneous tissue. Technique: Sharp excisional debridement to bleeding, viable wound base.  Dressing: Dry, sterile, compression dressing. Disposition: Patient tolerated procedure well.      Return in about 3 weeks (around 02/10/2022) for wound care.

## 2022-01-27 ENCOUNTER — Encounter (HOSPITAL_COMMUNITY): Payer: Self-pay | Admitting: Physician Assistant

## 2022-02-01 DIAGNOSIS — I129 Hypertensive chronic kidney disease with stage 1 through stage 4 chronic kidney disease, or unspecified chronic kidney disease: Secondary | ICD-10-CM | POA: Diagnosis not present

## 2022-02-01 DIAGNOSIS — E118 Type 2 diabetes mellitus with unspecified complications: Secondary | ICD-10-CM | POA: Diagnosis not present

## 2022-02-01 DIAGNOSIS — N183 Chronic kidney disease, stage 3 unspecified: Secondary | ICD-10-CM | POA: Diagnosis not present

## 2022-02-01 DIAGNOSIS — Z6841 Body Mass Index (BMI) 40.0 and over, adult: Secondary | ICD-10-CM | POA: Diagnosis not present

## 2022-02-01 DIAGNOSIS — M109 Gout, unspecified: Secondary | ICD-10-CM | POA: Diagnosis not present

## 2022-02-01 DIAGNOSIS — N3289 Other specified disorders of bladder: Secondary | ICD-10-CM | POA: Diagnosis not present

## 2022-02-01 DIAGNOSIS — E785 Hyperlipidemia, unspecified: Secondary | ICD-10-CM | POA: Diagnosis not present

## 2022-02-08 ENCOUNTER — Ambulatory Visit (INDEPENDENT_AMBULATORY_CARE_PROVIDER_SITE_OTHER): Payer: Medicare Other | Admitting: Podiatry

## 2022-02-08 DIAGNOSIS — L97522 Non-pressure chronic ulcer of other part of left foot with fat layer exposed: Secondary | ICD-10-CM | POA: Diagnosis not present

## 2022-02-08 LAB — SURGICAL PATHOLOGY

## 2022-02-09 NOTE — Progress Notes (Signed)
This encounter was created in error - please disregard.

## 2022-02-09 NOTE — Progress Notes (Signed)
  Subjective:  Patient ID: Micheal Walls, male    DOB: [birth year],  MRN: [id number]  Chief Complaint  Patient presents with   Foot Ulcer    3 week wound check left foot    DOS: 12/03/1926 Procedure: Amputation left second toe  86 y.o. male returns for post-op check.  Overall doing okay using gentamicin  Review of Systems: Negative except as noted in the HPI. Denies N/V/F/Ch.   Objective:  There were no vitals filed for this visit. There is no height or weight on file to calculate BMI. Constitutional Well developed. Well nourished.  Vascular Foot warm and well perfused. Capillary refill normal to all digits.  Calf is soft and supple, no posterior calf or knee pain, negative Homans' sign  Neurologic Normal speech. Oriented to person, place, and time. Epicritic sensation to light touch grossly absent bilaterally.  Dermatologic Residual ulceration post suture removal measures 1.8 x 0.4 x 0.4, granular wound bed no signs of infection exposed subcutaneous tissue  Orthopedic: He has no tenderness to palpation noted about the surgical site.   Pathology results showed osteomyelitis with no residual infection noted at margin      Assessment:   1. Ulcer of left foot with fat layer exposed ([place])     Plan:  Patient was evaluated and treated and all questions answered.  Ulcer left foot -We discussed the etiology and factors that are a part of the wound healing process.  We also discussed the risk of infection both soft tissue and osteomyelitis from open ulceration.  Discussed the risk of limb loss if this happens or worsens. -Overall continues to improve gradually and slowly -Debridement as below. -Dressed with Iodosorb, DSD. -Continue home dressing changes daily with 4 x 4 gauze and gentamicin -Continue off-loading with surgical shoe.   Procedure: Excisional Debridement of Wound Rationale: Removal of non-viable soft tissue from the wound to promote healing.  Anesthesia:  none Post-Debridement Wound Measurements: 1.8 x 0.4 x 0.4 Type of Debridement: Sharp Excisional Tissue Removed: Non-viable soft tissue Depth of Debridement: subcutaneous tissue. Technique: Sharp excisional debridement to bleeding, viable wound base.  Dressing: Dry, sterile, compression dressing. Disposition: Patient tolerated procedure well.      Return in about 3 weeks (around 03/01/2022) for wound care.

## 2022-02-10 ENCOUNTER — Other Ambulatory Visit: Payer: Self-pay

## 2022-02-10 MED ORDER — FREESTYLE LIBRE 14 DAY SENSOR MISC: 1 refills | Status: DC

## 2022-02-28 ENCOUNTER — Encounter: Payer: Self-pay | Admitting: Podiatry

## 2022-02-28 ENCOUNTER — Ambulatory Visit (INDEPENDENT_AMBULATORY_CARE_PROVIDER_SITE_OTHER): Payer: Medicare Other | Admitting: Podiatry

## 2022-02-28 DIAGNOSIS — L97522 Non-pressure chronic ulcer of other part of left foot with fat layer exposed: Secondary | ICD-10-CM

## 2022-02-28 DIAGNOSIS — Z89422 Acquired absence of other left toe(s): Secondary | ICD-10-CM

## 2022-02-28 DIAGNOSIS — I739 Peripheral vascular disease, unspecified: Secondary | ICD-10-CM

## 2022-02-28 DIAGNOSIS — E1151 Type 2 diabetes mellitus with diabetic peripheral angiopathy without gangrene: Secondary | ICD-10-CM

## 2022-02-28 MED ORDER — GENTAMICIN SULFATE 0.1 % EX OINT: 1.0000 | TOPICAL_OINTMENT | Freq: Three times a day (TID) | CUTANEOUS | 0 refills | Status: DC

## 2022-02-28 NOTE — Progress Notes (Signed)
  Subjective:  Patient ID: Micheal Walls, male    DOB: [birth year],  MRN: [id number]  Chief Complaint  Patient presents with   Wound Check      3 weeks follow up for wound care.    DOS: 12/03/1926 Procedure: Amputation left second toe  86 y.o. male returns for post-op check.  Overall doing okay using gentamicin  Review of Systems: Negative except as noted in the HPI. Denies N/V/F/Ch.   Objective:  There were no vitals filed for this visit. There is no height or weight on file to calculate BMI. Constitutional Well developed. Well nourished.  Vascular Foot warm and well perfused. Capillary refill normal to all digits.  Calf is soft and supple, no posterior calf or knee pain, negative Homans' sign  Neurologic Normal speech. Oriented to person, place, and time. Epicritic sensation to light touch grossly absent bilaterally.  Dermatologic Residual ulceration post suture removal measures 0.5 x 0.2 x 0.2, granular wound bed no signs of infection exposed subcutaneous tissue  Orthopedic: He has no tenderness to palpation noted about the surgical site.   Pathology results showed osteomyelitis with no residual infection noted at margin      Assessment:   1. Ulcer of left foot with fat layer exposed ([place])   2. Status post amputation of lesser toe of left foot ([place])   3. Type II diabetes mellitus with peripheral circulatory disorder (HCC)   4. PAD (peripheral artery disease) ([place])     Plan:  Patient was evaluated and treated and all questions answered.  Ulcer left foot -We discussed the etiology and factors that are a part of the wound healing process.  We also discussed the risk of infection both soft tissue and osteomyelitis from open ulceration.  Discussed the risk of limb loss if this happens or worsens. -Overall continues to improve gradually and slowly -Debridement as below. -Dressed with Iodosorb, DSD. -Continue home dressing changes daily with 4 x 4 gauze and  gentamicin -Continue off-loading with surgical shoe.   Procedure: Excisional Debridement of Wound Rationale: Removal of non-viable soft tissue from the wound to promote healing.  Anesthesia: none Post-Debridement Wound Measurements: 0.5x 0.2 x 0.2 Type of Debridement: Sharp Excisional Tissue Removed: Non-viable soft tissue Depth of Debridement: subcutaneous tissue. Technique: Sharp excisional debridement to bleeding, viable wound base.  Dressing: Dry, sterile, compression dressing. Disposition: Patient tolerated procedure well.      No follow-ups on file.

## 2022-03-21 ENCOUNTER — Ambulatory Visit (INDEPENDENT_AMBULATORY_CARE_PROVIDER_SITE_OTHER): Payer: Medicare Other | Admitting: Nurse Practitioner

## 2022-03-21 ENCOUNTER — Encounter: Payer: Self-pay | Admitting: Nurse Practitioner

## 2022-03-21 VITALS — BP 138/62 | HR 75 | Temp 98.0°F

## 2022-03-21 DIAGNOSIS — E039 Hypothyroidism, unspecified: Secondary | ICD-10-CM

## 2022-03-21 DIAGNOSIS — E1122 Type 2 diabetes mellitus with diabetic chronic kidney disease: Secondary | ICD-10-CM | POA: Diagnosis not present

## 2022-03-21 DIAGNOSIS — Z9989 Dependence on other enabling machines and devices: Secondary | ICD-10-CM | POA: Diagnosis not present

## 2022-03-21 DIAGNOSIS — G4733 Obstructive sleep apnea (adult) (pediatric): Secondary | ICD-10-CM

## 2022-03-21 DIAGNOSIS — E782 Mixed hyperlipidemia: Secondary | ICD-10-CM

## 2022-03-21 DIAGNOSIS — N184 Chronic kidney disease, stage 4 (severe): Secondary | ICD-10-CM | POA: Diagnosis not present

## 2022-03-21 DIAGNOSIS — E1159 Type 2 diabetes mellitus with other circulatory complications: Secondary | ICD-10-CM | POA: Diagnosis not present

## 2022-03-21 DIAGNOSIS — Z23 Encounter for immunization: Secondary | ICD-10-CM

## 2022-03-21 DIAGNOSIS — Z794 Long term (current) use of insulin: Secondary | ICD-10-CM | POA: Diagnosis not present

## 2022-03-21 NOTE — Patient Instructions (Addendum)
Diabetes Mellitus and Foot Care Foot care is an important part of your health, especially when you have diabetes. Diabetes may cause you to have problems because of poor blood flow (circulation) to your feet and legs, which can cause your skin to: Become thinner and drier. Break more easily. Heal more slowly. Peel and crack. You may also have nerve damage (neuropathy) in your legs and feet, causing decreased feeling in them. This means that you may not notice minor injuries to your feet that could lead to more serious problems. Noticing and addressing any potential problems early is the best way to prevent future foot problems. How to care for your feet Foot hygiene  Wash your feet daily with warm water and mild soap. Do not use hot water. Then, pat your feet and the areas between your toes until they are completely dry. Do not soak your feet as this can dry your skin. Trim your toenails straight across. Do not dig under them or around the cuticle. File the edges of your nails with an emery board or nail file. Apply a moisturizing lotion or petroleum jelly to the skin on your feet and to dry, brittle toenails. Use lotion that does not contain alcohol and is unscented. Do not apply lotion between your toes. Shoes and socks Wear clean socks or stockings every day. Make sure they are not too tight. Do not wear knee-high stockings since they may decrease blood flow to your legs. Wear shoes that fit properly and have enough cushioning. Always look in your shoes before you put them on to be sure there are no objects inside. To break in new shoes, wear them for just a few hours a day. This prevents injuries on your feet. Wounds, scrapes, corns, and calluses  Check your feet daily for blisters, cuts, bruises, sores, and redness. If you cannot see the bottom of your feet, use a mirror or ask someone for help. Do not cut corns or calluses or try to remove them with medicine. If you find a minor scrape,  cut, or break in the skin on your feet, keep it and the skin around it clean and dry. You may clean these areas with mild soap and water. Do not clean the area with peroxide, alcohol, or iodine. If you have a wound, scrape, corn, or callus on your foot, look at it several times a day to make sure it is healing and not infected. Check for: Redness, swelling, or pain. Fluid or blood. Warmth. Pus or a bad smell. General tips Do not cross your legs. This may decrease blood flow to your feet. Do not use heating pads or hot water bottles on your feet. They may burn your skin. If you have lost feeling in your feet or legs, you may not know this is happening until it is too late. Protect your feet from hot and cold by wearing shoes, such as at the beach or on hot pavement. Schedule a complete foot exam at least once a year (annually) or more often if you have foot problems. Report any cuts, sores, or bruises to your health care provider immediately. Where to find more information American Diabetes Association: www.diabetes.org Association of Diabetes Care & Education Specialists: www.diabeteseducator.org Contact a health care provider if: You have a medical condition that increases your risk of infection and you have any cuts, sores, or bruises on your feet. You have an injury that is not healing. You have redness on your legs or feet. You  feel burning or tingling in your legs or feet. You have pain or cramps in your legs and feet. Your legs or feet are numb. Your feet always feel cold. You have pain around any toenails. Get help right away if: You have a wound, scrape, corn, or callus on your foot and: You have pain, swelling, or redness that gets worse. You have fluid or blood coming from the wound, scrape, corn, or callus. Your wound, scrape, corn, or callus feels warm to the touch. You have pus or a bad smell coming from the wound, scrape, corn, or callus. You have a fever. You have a red  line going up your leg. Summary Check your feet every day for blisters, cuts, bruises, sores, and redness. Apply a moisturizing lotion or petroleum jelly to the skin on your feet and to dry, brittle toenails. Wear shoes that fit properly and have enough cushioning. If you have foot problems, report any cuts, sores, or bruises to your health care provider immediately. Schedule a complete foot exam at least once a year (annually) or more often if you have foot problems. This information is not intended to replace advice given to you by your health care provider. Make sure you discuss any questions you have with your health care provider. Document Revised: 12/19/2019 Document Reviewed: 12/19/2019 Elsevier Patient Education  [place].   Influenza (Flu) Vaccine (Inactivated or Recombinant): What You Need to Know 1. Why get vaccinated? Influenza vaccine can prevent influenza (flu). Flu is a contagious disease that spreads around the [place] every year, usually between October and May. Anyone can get the flu, but it is more dangerous for some people. Infants and young children, people 53 years and older, pregnant people, and people with certain health conditions or a weakened immune system are at greatest risk of flu complications. Pneumonia, bronchitis, sinus infections, and ear infections are examples of flu-related complications. If you have a medical condition, such as heart disease, cancer, or diabetes, flu can make it worse. Flu can cause fever and chills, sore throat, muscle aches, fatigue, cough, headache, and runny or stuffy nose. Some people may have vomiting and diarrhea, though this is more common in children than adults. In an average year, thousands of people in the [place] die from flu, and many more are hospitalized. Flu vaccine prevents millions of illnesses and flu-related visits to the doctor each year. 2. Influenza vaccines CDC recommends everyone 6 months and  older get vaccinated every flu season. Children 6 months through 24 years of age may need 2 doses during a single flu season. Everyone else needs only 1 dose each flu season. It takes about 2 weeks for protection to develop after vaccination. There are many flu viruses, and they are always changing. Each year a new flu vaccine is made to protect against the influenza viruses believed to be likely to cause disease in the upcoming flu season. Even when the vaccine doesn't exactly match these viruses, it may still provide some protection. Influenza vaccine does not cause flu. Influenza vaccine may be given at the same time as other vaccines. 3. Talk with your health care provider Tell your vaccination provider if the person getting the vaccine: Has had an allergic reaction after a previous dose of influenza vaccine, or has any severe, life-threatening allergies Has ever had Guillain-Barr Syndrome (also called "GBS") In some cases, your health care provider may decide to postpone influenza vaccination until a future visit. Influenza vaccine can be administered  at any time during pregnancy. People who are or will be pregnant during influenza season should receive inactivated influenza vaccine. People with minor illnesses, such as a cold, may be vaccinated. People who are moderately or severely ill should usually wait until they recover before getting influenza vaccine. Your health care provider can give you more information. 4. Risks of a vaccine reaction Soreness, redness, and swelling where the shot is given, fever, muscle aches, and headache can happen after influenza vaccination. There may be a very small increased risk of Guillain-Barr Syndrome (GBS) after inactivated influenza vaccine (the flu shot). Young children who get the flu shot along with pneumococcal vaccine (PCV13) and/or DTaP vaccine at the same time might be slightly more likely to have a seizure caused by fever. Tell your health care  provider if a child who is getting flu vaccine has ever had a seizure. People sometimes faint after medical procedures, including vaccination. Tell your provider if you feel dizzy or have vision changes or ringing in the ears. As with any medicine, there is a very remote chance of a vaccine causing a severe allergic reaction, other serious injury, or death. 5. What if there is a serious problem? An allergic reaction could occur after the vaccinated person leaves the clinic. If you see signs of a severe allergic reaction (hives, swelling of the face and throat, difficulty breathing, a fast heartbeat, dizziness, or weakness), call 9-1-1 and get the person to the nearest hospital. For other signs that concern you, call your health care provider. Adverse reactions should be reported to the Vaccine Adverse Event Reporting System (VAERS). Your health care provider will usually file this report, or you can do it yourself. Visit the VAERS website at www.vaers.SamedayNews.es or call 541-824-5820. VAERS is only for reporting reactions, and VAERS staff members do not give medical advice. 6. The National Vaccine Injury Compensation Program The [employer] Vaccine Injury Compensation Program (VICP) is a federal program that was created to compensate people who may have been injured by certain vaccines. Claims regarding alleged injury or death due to vaccination have a time limit for filing, which may be as short as two years. Visit the VICP website at GoldCloset.com.ee or call 435-664-5016 to learn about the program and about filing a claim. 7. How can I learn more? Ask your health care provider. Call your local or state health department. Visit the website of the Food and Drug Administration (FDA) for vaccine package inserts and additional information at TraderRating.uy. Contact the Centers for Disease Control and Prevention (CDC): Call (579) 338-1159 (1-800-CDC-INFO)  or Visit CDC's website at https://gibson.com/. Source: CDC Vaccine Information Statement Inactivated Influenza Vaccine (01/17/2020) This same material is available at http://www.wolf.info/ for no charge. This information is not intended to replace advice given to you by your health care provider. Make sure you discuss any questions you have with your health care provider. Document Revised: 04/28/2021 Document Reviewed: 02/18/2021 Elsevier Patient Education  [place].

## 2022-03-21 NOTE — Progress Notes (Signed)
I,Tianna Badgett,acting as a Education administrator for [employer], [name].,have documented all relevant documentation on the behalf of [name], [name],as directed by  [name], [name] while in the presence of [name], [place].  Subjective:     Patient ID: Micheal Walls , male    DOB: 29-Dec-1935 , 86 y.o.   MRN: [id number]   Chief Complaint  Patient presents with   Diabetes    HPI  Patient here for a f/u on his diabetes. He continues to be followed by Podiatry for his left foot, he reports it is healing fine. He has completed his Gentamycin. He is changing his dressing every 3 days. CPAP gave out and has a loaner he will get a new one at the end of the year.   Diabetes He presents for his follow-up diabetic visit. He has type 2 diabetes mellitus. His disease course has been improving. Pertinent negatives for hypoglycemia include no confusion, dizziness or nervousness/anxiousness. There are no diabetic associated symptoms. Pertinent negatives for diabetes include no fatigue, no polydipsia, no polyphagia and no polyuria. There are no hypoglycemic complications. Symptoms are improving. There are no diabetic complications. Risk factors for coronary artery disease include sedentary lifestyle and obesity. Current diabetic treatment includes oral agent (monotherapy) (no longer on Tresiba since December). He is compliant with treatment all of the time. His weight is stable. He is following a diabetic diet. When asked about meal planning, he reported none. He has not had a previous visit with a dietitian. He rarely participates in exercise. His home blood glucose trend is decreasing steadily. (He did not bring his glucometer this morning. This morning blood sugar was 78) An ACE inhibitor/angiotensin II receptor blocker is being taken. He sees a podiatrist (Dr Adah Perl - continues to be followed for his left toe amputation 2nd toe).Eye exam is current (Dr. Zadie Rhine 06/24/2021).     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 Swallow whole., Disp: , Rfl:    colchicine 0.6 MG tablet, Take 0.6 mg by mouth once a week., Disp: , Rfl:    Continuous Blood Gluc Sensor (FREESTYLE LIBRE 14 DAY SENSOR) MISC, Use as directed to check blood sugars, Disp: 6 each, Rfl: 1   diltiazem (CARDIZEM CD) 240 MG 24 hr capsule, TAKE ONE CAPSULE BY MOUTH ONCE DAILY, Disp: 90 capsule, Rfl: 3   dorzolamide (TRUSOPT) 2 % ophthalmic solution, Place 1 drop into both eyes 2 (two) times daily., Disp: , Rfl:    ferrous sulfate 325 (65 FE) MG tablet, Take 1 tablet (325 mg total) by mouth 2 (two) times daily with a meal., Disp: , Rfl: 3   furosemide (LASIX) 40 MG tablet, Take 1 tablet (  40 mg total) by mouth 2 (two) times daily., Disp: 180 tablet, Rfl: 1   furosemide (LASIX) 40 MG tablet, Take 1 tablet in the morning and one in the evening, Disp: 180 tablet, Rfl: 1   gabapentin (NEURONTIN) 100 MG capsule, TAKE TWO CAPSULES BY MOUTH EVERY MORNING and TAKE ONE CAPSULE BY MOUTH AT NOON and TAKE ONE CAPSULE BY MOUTH EVERY EVENING (Patient taking differently: Take 100-200 mg by mouth See admin instructions. Take two capsules by mouth every morning and then take one capsule by mouth at noon and one capsule by mouth every evening.), Disp: 360 capsule, Rfl: 1   gentamicin ointment (GARAMYCIN) 0.1 %, Apply 1 Application topically 3 (three) times  daily., Disp: 15 g, Rfl: 0   glucose blood (FREESTYLE PRECISION NEO TEST) test strip, Use as instructed, Disp: 100 each, Rfl: 12   ketotifen (ZADITOR) 0.025 % ophthalmic solution, Place 1 drop into both eyes 2 (two) times daily., Disp: , Rfl:    latanoprost (XALATAN) 0.005 % ophthalmic solution, Place 1 drop into both eyes at bedtime. , Disp: , Rfl:    Multiple Vitamins-Minerals (MULTIVITAMIN WITH MINERALS) tablet, Take 1 tablet by mouth daily., Disp: , Rfl:    Multiple Vitamins-Minerals (PRESERVISION AREDS) CAPS, Take 2 capsules by mouth daily., Disp: , Rfl:    pantoprazole (PROTONIX) 40 MG tablet, TAKE ONE TABLET BY MOUTH EVERYDAY AT BEDTIME, Disp: 90 tablet, Rfl: 1   rosuvastatin (CRESTOR) 20 MG tablet, Take 1 tablet (20 mg total) by mouth daily., Disp: 90 tablet, Rfl: 3   Semaglutide, [name], (OZEMPIC, [name],) 2 MG/1.5ML SOPN, Inject 1 mg into the skin once a week., Disp: 9 mL, Rfl: 1   senna-docusate (SENOKOT-S) 8.6-50 MG tablet, Take 1 tablet by mouth 2 (two) times daily between meals as needed for mild constipation or moderate constipation. (Patient taking differently: Take 1 tablet by mouth 2 (two) times daily as needed (constipation).), Disp: 60 tablet, Rfl: 0   SYNTHROID 25 MCG tablet, Take 1 tablet by mouth Monday - Friday (Patient taking differently: Take 25 mcg by mouth See admin instructions. Take 25 mcg by mouth Monday through Friday.), Disp: 60 tablet, Rfl: 2   tamsulosin (FLOMAX) 0.4 MG CAPS capsule, Take 0.4 mg by mouth daily., Disp: , Rfl:    Allergies  Allergen Reactions   Vancomycin Rash     Review of Systems  Constitutional: Negative.  Negative for fatigue.  HENT: Negative.    Eyes: Negative.   Respiratory: Negative.    Cardiovascular: Negative.   Gastrointestinal: Negative.   Endocrine: Negative.  Negative for polydipsia, polyphagia and polyuria.  Genitourinary: Negative.   Musculoskeletal: Negative.   Skin: Negative.   Allergic/Immunologic: Negative.    Neurological: Negative.  Negative for dizziness.  Hematological: Negative.   Psychiatric/Behavioral: Negative.  Negative for confusion. The patient is not nervous/anxious.      Today's Vitals   03/21/22 1013  BP: 138/62  Pulse: 75  Temp: 98 F (36.7 C)  TempSrc: Oral   There is no height or weight on file to calculate BMI.   Objective:  Physical Exam Vitals reviewed.  Constitutional:      General: He is not in acute distress.    Appearance: Normal appearance. He is obese.  Cardiovascular:     Rate and Rhythm: Normal rate and regular rhythm.     Pulses: Normal pulses.     Heart sounds: Normal heart sounds. No murmur heard. Pulmonary:     Effort: Pulmonary effort is normal.  No respiratory distress.     Breath sounds: Normal breath sounds. No wheezing.  Musculoskeletal:     Comments: Left lower extremity is wrapped in ace bandage and dressing to foot ulcer  Skin:    Capillary Refill: Capillary refill takes less than 2 seconds.     Comments: Continues to wear a dressing to his left foot that is being managed by his Podiatrist  Neurological:     General: No focal deficit present.     Mental Status: He is alert and oriented to person, place, and time.     Cranial Nerves: No cranial nerve deficit.     Motor: No weakness.  Psychiatric:        Mood and Affect: Mood normal.        Behavior: Behavior normal.        Thought Content: Thought content normal.        Judgment: Judgment normal.         Assessment And Plan:     1. Type 2 diabetes mellitus with other circulatory complication, with long-term current use of insulin (HCC) Comments: HgbA1c is improving, continue current medications.  - Hemoglobin A1c  2. Stage 4 chronic kidney disease ([place]) Comments: Continue follow up with [facility] - Hemoglobin A1c - CMP14+EGFR  3. Acquired hypothyroidism Comments: Controlled, continue current medications.  - TSH + free T4  4. Mixed hyperlipidemia Comments:  Cholesterol levels are stable, continue statin, tolerating well.  - Lipid panel - CMP14+EGFR  5. OSA on CPAP Comments: Called to [facility] and patient is not eligible for a new machine until December, currently has a Materials engineer. Benefits from using CPAP  6. Need for influenza vaccination Influenza vaccine administered Encouraged to take Tylenol as needed for fever or muscle aches. - Flu Vaccine QUAD High Dose(Fluad)    Patient was given opportunity to ask questions. Patient verbalized understanding of the plan and was able to repeat key elements of the plan. All questions were answered to their satisfaction.  [name], [name]   I, [name], [name], have reviewed all documentation for this visit. The documentation on 03/21/22 for the exam, diagnosis, procedures, and orders are all accurate and complete.   IF YOU HAVE BEEN REFERRED TO A SPECIALIST, IT MAY TAKE 1-2 WEEKS TO SCHEDULE/PROCESS THE REFERRAL. IF YOU HAVE NOT HEARD FROM US/SPECIALIST IN TWO WEEKS, PLEASE GIVE [place] A CALL AT [phone] X [phone].   THE PATIENT IS ENCOURAGED TO PRACTICE SOCIAL DISTANCING DUE TO THE COVID-19 PANDEMIC.

## 2022-03-22 ENCOUNTER — Ambulatory Visit: Payer: Medicare Other | Admitting: Podiatry

## 2022-03-22 ENCOUNTER — Ambulatory Visit (INDEPENDENT_AMBULATORY_CARE_PROVIDER_SITE_OTHER): Payer: Medicare Other | Admitting: Podiatry

## 2022-03-22 DIAGNOSIS — L97522 Non-pressure chronic ulcer of other part of left foot with fat layer exposed: Secondary | ICD-10-CM

## 2022-03-22 LAB — CMP14+EGFR
ALT: 36 IU/L (ref 0–44)
AST: 30 IU/L (ref 0–40)
Albumin/Globulin Ratio: 1.3 (ref 1.2–2.2)
Albumin: 4 g/dL (ref 3.7–4.7)
Alkaline Phosphatase: 93 IU/L (ref 44–121)
BUN/Creatinine Ratio: 32 — ABNORMAL HIGH (ref 10–24)
BUN: 56 mg/dL — ABNORMAL HIGH (ref 8–27)
Bilirubin Total: 0.3 mg/dL (ref 0.0–1.2)
CO2: 24 mmol/L (ref 20–29)
Calcium: 10.1 mg/dL (ref 8.6–10.2)
Chloride: 104 mmol/L (ref 96–106)
Creatinine, Ser: 1.73 mg/dL — ABNORMAL HIGH (ref 0.76–1.27)
Globulin, Total: 3 g/dL (ref 1.5–4.5)
Glucose: 94 mg/dL (ref 70–99)
Potassium: 4.2 mmol/L (ref 3.5–5.2)
Sodium: 142 mmol/L (ref 134–144)
Total Protein: 7 g/dL (ref 6.0–8.5)
eGFR: 38 mL/min/{1.73_m2} — ABNORMAL LOW (ref >59)

## 2022-03-22 LAB — TSH+FREE T4
Free T4: 0.94 ng/dL (ref 0.82–1.77)
TSH: 4.14 u[IU]/mL (ref 0.450–4.500)

## 2022-03-22 LAB — HEMOGLOBIN A1C
Est. average glucose Bld gHb Est-mCnc: 128 mg/dL
Hgb A1c MFr Bld: 6.1 % — ABNORMAL HIGH (ref 4.8–5.6)

## 2022-03-23 ENCOUNTER — Ambulatory Visit: Payer: Self-pay

## 2022-03-23 NOTE — Patient Outreach (Signed)
  Care Coordination   03/23/2022 Name: Micheal Walls MRN: [id number] DOB: [birth year]   Care Coordination Outreach Attempts:  An unsuccessful telephone outreach was attempted for a scheduled appointment today.  Follow Up Plan:  Additional outreach attempts will be made to offer the patient care coordination information and services.   Encounter Outcome:  No Answer  Care Coordination Interventions Activated:  No   Care Coordination Interventions:  No, not indicated    Barb Merino, RN, BSN, CCM Care Management Coordinator [place] Management Direct Phone: [phone]

## 2022-03-27 NOTE — Progress Notes (Signed)
  Subjective:  Patient ID: Micheal Walls, male    DOB: [birth year],  MRN: [id number]  Chief Complaint  Patient presents with   Foot Ulcer    3 week follow ulcer left foot     DOS: 12/03/1926 Procedure: Amputation left second toe  86 y.o. male returns for post-op check.  Overall doing okay using gentamicin  Review of Systems: Negative except as noted in the HPI. Denies N/V/F/Ch.   Objective:  There were no vitals filed for this visit. There is no height or weight on file to calculate BMI. Constitutional Well developed. Well nourished.  Vascular Foot warm and well perfused. Capillary refill normal to all digits.  Calf is soft and supple, no posterior calf or knee pain, negative Homans' sign  Neurologic Normal speech. Oriented to person, place, and time. Epicritic sensation to light touch grossly absent bilaterally.  Dermatologic Wounds have healed with overlying hyperkeratosis  Orthopedic: He has no tenderness to palpation noted about the surgical site.   Pathology results showed osteomyelitis with no residual infection noted at margin      Assessment:   1. Ulcer of left foot with fat layer exposed ([place])      Plan:  Patient was evaluated and treated and all questions answered.  Ulcer left foot -Wounds have healed. He may resume regular bathing, can leave open to air and apply gentamicin daily. Return in 1 month for re-evaluation

## 2022-03-30 ENCOUNTER — Ambulatory Visit: Payer: Self-pay

## 2022-03-30 NOTE — Patient Outreach (Signed)
  Care Coordination   Follow Up Visit Note   03/30/2022 Name: Micheal Walls MRN: [id number] DOB: 05-27-1936  Micheal Walls is a 86 y.o. year old male who sees [name], [place] for primary care. I spoke with  [name] by phone today.  What matters to the patients health and wellness today?  Patient would like to drink more water and eat less portions.    Goals Addressed               This Visit's Progress     Patient Stated     I will drink more water (pt-stated)        Care Coordination Interventions: Assessed the Patient understanding of chronic kidney disease    Evaluation of current treatment plan related to chronic kidney disease self management and patient's adherence to plan as established by provider      Reviewed prescribed diet Increase daily water intake to 48-64 oz daily  Provided education on kidney disease progression            I will work on eating less portions (pt-stated)        Care Coordination Interventions: Provided education to patient about basic DM disease process Reviewed medications with patient and discussed importance of medication adherence Review of patient status, including review of consultants reports, relevant laboratory and other test results, and medications completed Educated on patient on dietary and exercise recommendations Mailed printed educational materials related to Chair Exercises; Carb Choice list; Meal Planning           SDOH assessments and interventions completed:  No     Care Coordination Interventions Activated:  Yes  Care Coordination Interventions:  Yes, provided   Follow up plan: Follow up call scheduled for 06/01/22 $RemoveBefo'@11'[id number]$ :30 AM    Encounter Outcome:  Pt. Visit Completed

## 2022-03-30 NOTE — Patient Instructions (Signed)
Visit Information  Thank you for taking time to visit with me today. Please don't hesitate to contact me if I can be of assistance to you.   Following are the goals we discussed today:   Goals Addressed               This Visit's Progress     Patient Stated     I will drink more water (pt-stated)        Care Coordination Interventions: Assessed the Patient understanding of chronic kidney disease    Evaluation of current treatment plan related to chronic kidney disease self management and patient's adherence to plan as established by provider      Reviewed prescribed diet Increase daily water intake to 48-64 oz daily  Provided education on kidney disease progression            I will work on eating less portions (pt-stated)        Care Coordination Interventions: Provided education to patient about basic DM disease process Reviewed medications with patient and discussed importance of medication adherence Review of patient status, including review of consultants reports, relevant laboratory and other test results, and medications completed Educated on patient on dietary and exercise recommendations Mailed printed educational materials related to Chair Exercises; Carb Choice list; Meal Planning           Our next appointment is by telephone on 06/01/22 at 11:30 AM  Please call the care guide team at [phone] if you need to cancel or reschedule your appointment.   If you are experiencing a Mental Health or [place] or need someone to talk to, please call 1-800-273-TALK (toll free, 24 hour hotline) go to [facility] [address] [place] [phone])  Patient verbalizes understanding of instructions and care plan provided today and agrees to view in [place]. Active MyChart status and patient understanding of how to access instructions and care plan via MyChart confirmed with patient.     Barb Merino, RN, BSN,  CCM Care Management Coordinator [facility] Care Management  Direct Phone: [phone]

## 2022-04-04 ENCOUNTER — Other Ambulatory Visit: Payer: Self-pay | Admitting: Nurse Practitioner

## 2022-04-04 DIAGNOSIS — I739 Peripheral vascular disease, unspecified: Secondary | ICD-10-CM

## 2022-04-04 DIAGNOSIS — G629 Polyneuropathy, unspecified: Secondary | ICD-10-CM

## 2022-04-06 ENCOUNTER — Telehealth: Payer: Self-pay

## 2022-04-06 ENCOUNTER — Other Ambulatory Visit: Payer: Self-pay

## 2022-04-06 DIAGNOSIS — E039 Hypothyroidism, unspecified: Secondary | ICD-10-CM

## 2022-04-06 MED ORDER — SYNTHROID 25 MCG PO TABS: ORAL_TABLET | ORAL | 2 refills | Status: DC

## 2022-04-06 NOTE — Chronic Care Management (AMB) (Signed)
Chronic Care Management Pharmacy Assistant   Name: Micheal Walls  MRN: [id number] DOB: 10/03/1935  Reason for Encounter: Medication Review/ Medication Coordination  Recent office visits:  03-30-2022 Little, Claudette Stapler, RN (CCM).  03-23-2022 Little, Claudette Stapler, RN (CCM).  03-21-2022 [name], [name]. BUN= 56, Creatinine= 1.73, eGFR= 38, BUN/Creatinine= 32. A1C= 6.1.  01-11-2022 Humble, Kendra (CCM).  01-10-2022 [name] (CCM).  Recent consult visits:  03-22-2022 [name], DPM (Podiatry). Follow up on left foot ulcer.  02-28-2022 Lorenda Peck, DPM (Podiatry). Visit for wound check.  02-08-2022 [name], DPM (Podiatry). Follow up on left foot ulcer.  01-21-2022 Cordelia Poche (Oncology). Follow up visit. COMPLETED oxycodone.  01-20-2022 [name], DPM (Podiatry). Follow up on left foot ulcer.  Hospital visits:  None in previous 6 months  Medications: Outpatient Encounter Medications as of 04/06/2022  Medication Sig   allopurinol (ZYLOPRIM) 100 MG tablet Take 100 mg by mouth 2 (two) times daily.   aspirin EC 81 MG tablet Take 81 mg by mouth daily. Swallow whole.   colchicine 0.6 MG tablet Take 0.6 mg by mouth once a week.   Continuous Blood Gluc Sensor (FREESTYLE LIBRE 14 DAY SENSOR) MISC Use as directed to check blood sugars   diltiazem (CARDIZEM CD) 240 MG 24 hr capsule TAKE ONE CAPSULE BY MOUTH ONCE DAILY   dorzolamide (TRUSOPT) 2 % ophthalmic solution Place 1 drop into both eyes 2 (two) times daily.   ferrous sulfate 325 (65 FE) MG tablet Take 1 tablet (325 mg total) by mouth 2 (two) times daily with a meal.   furosemide (LASIX) 40 MG tablet Take 1 tablet (40 mg total) by mouth 2 (two) times daily.   furosemide (LASIX) 40 MG tablet Take 1 tablet in the morning and one in the evening   gabapentin (NEURONTIN) 100 MG capsule TAKE TWO CAPSULES BY MOUTH EVERY MORNING and TAKE ONE CAPSULE BY MOUTH AT NOON and TAKE ONE CAPSULE BY MOUTH  EVERY EVENING   gentamicin ointment (GARAMYCIN) 0.1 % Apply 1 Application topically 3 (three) times daily.   glucose blood (FREESTYLE PRECISION NEO TEST) test strip Use as instructed   ketotifen (ZADITOR) 0.025 % ophthalmic solution Place 1 drop into both eyes 2 (two) times daily.   latanoprost (XALATAN) 0.005 % ophthalmic solution Place 1 drop into both eyes at bedtime.    Multiple Vitamins-Minerals (MULTIVITAMIN WITH MINERALS) tablet Take 1 tablet by mouth daily.   Multiple Vitamins-Minerals (PRESERVISION AREDS) CAPS Take 2 capsules by mouth daily.   pantoprazole (PROTONIX) 40 MG tablet TAKE ONE TABLET BY MOUTH EVERYDAY AT BEDTIME   rosuvastatin (CRESTOR) 20 MG tablet Take 1 tablet (20 mg total) by mouth daily.   Semaglutide, [name], (OZEMPIC, [name],) 2 MG/1.5ML SOPN Inject 1 mg into the skin once a week.   senna-docusate (SENOKOT-S) 8.6-50 MG tablet Take 1 tablet by mouth 2 (two) times daily between meals as needed for mild constipation or moderate constipation. (Patient taking differently: Take 1 tablet by mouth 2 (two) times daily as needed (constipation).)   SYNTHROID 25 MCG tablet Take 1 tablet by mouth Monday - Friday (Patient taking differently: Take 25 mcg by mouth See admin instructions. Take 25 mcg by mouth Monday through Friday.)   tamsulosin (FLOMAX) 0.4 MG CAPS capsule Take 0.4 mg by mouth daily.   No facility-administered encounter medications on file as of 04/06/2022.  Reviewed chart for medication changes ahead of medication coordination call.  No hospital visits  since last care coordination call/Pharmacist visit.   No medication changes indicated   BP Readings from Last 3 Encounters:  03/21/22 138/62  01/17/22 (!) 147/69  01/17/22 (!) 179/98    Lab Results  Component Value Date   HGBA1C 6.1 (H) 03/21/2022     Patient obtains medications through Adherence Packaging  90 Days   Last adherence delivery included:  Allopurinol 100 mg- 2 tablets daily  (breakfast) Flomax 0.4 mg- 1 tablet daily (breakfast)   Diltiazem CD 240 mg- 1 tablet daily (breakfast) Pantoprazole 40 mg- 1 tablet daily (bedtime) Colchicine 0.6 mg- 1 tablet once weekly (breakfast) Gabapentin 100 mg- 2 tablets at breakfast,  1 tablet at lunch, 1 tablet with evening meal Furosemide 40 mg- 1 tablet with breakfast and 1 tablet with evening meal Rosuvastatin 10 mg- 1 tablet daily (bedtime) Synthroid 25 mcg- 1 tablet at breakfast Monday-Friday  Patient declined (meds) last delivery: None  Patient is due for next adherence delivery on: 04-18-2022  Called patient and reviewed medications and coordinated delivery.  This delivery to include: Allopurinol 100 mg- 2 tablets daily (breakfast) Flomax 0.4 mg- 1 tablet daily (breakfast)   Diltiazem CD 240 mg- 1 tablet daily (breakfast) Pantoprazole 40 mg- 1 tablet daily (bedtime) Colchicine 0.6 mg- 1 tablet once weekly (breakfast) Gabapentin 100 mg- 2 tablets at breakfast,  1 tablet at lunch, 1 tablet with evening meal Furosemide 40 mg- 1 tablet with breakfast and 1 tablet with evening meal Rosuvastatin 10 mg- 1 tablet daily (bedtime) Synthroid 25 mcg- 1 tablet at breakfast Monday-Friday  No acute/short fill needed  Patient declined the following medications: Ozempic- Gets from [facility]- No longer taking Latanoprost- Gets from [facility] Dorzolamide- Gets from [facility] Ferrous sulfate- OTC Zaditor- No longer taking Freestyle libre- Gets from [facility]   Patient needs refills for: Sent to PCP Synthroid  Confirmed delivery date of 04-18-2022, advised patient that pharmacy will contact them the morning of delivery.   Care Gaps: Covid booster overdue  Star Rating Drugs: Rosuvastatin 20 mg- Last filled 01-14-2022 [facility] Ozempic 1 mg- Last filled 01-27-2022 84 [facility] Clinical Pharmacist  Assistant [phone]

## 2022-04-15 ENCOUNTER — Telehealth: Payer: Medicare Other

## 2022-04-19 ENCOUNTER — Ambulatory Visit (INDEPENDENT_AMBULATORY_CARE_PROVIDER_SITE_OTHER): Payer: Medicare Other | Admitting: Podiatry

## 2022-04-19 DIAGNOSIS — L97522 Non-pressure chronic ulcer of other part of left foot with fat layer exposed: Secondary | ICD-10-CM | POA: Diagnosis not present

## 2022-04-19 NOTE — Progress Notes (Signed)
  Subjective:  Patient ID: Micheal Walls, male    DOB: [birth year],  MRN: [id number]  Chief Complaint  Patient presents with   Foot Ulcer    1 month follow up left foot      86 y.o. male returns for follow up on left foot ulcers, he says they are doing well and remain healed.  Review of Systems: Negative except as noted in the HPI. Denies N/V/F/Ch.   Objective:  There were no vitals filed for this visit. There is no height or weight on file to calculate BMI. Constitutional Well developed. Well nourished.  Vascular Foot warm and well perfused. Capillary refill normal to all digits.  Calf is soft and supple, no posterior calf or knee pain, negative Homans' sign  Neurologic Normal speech. Oriented to person, place, and time. Epicritic sensation to light touch grossly absent bilaterally.  Dermatologic Wounds have healed with overlying hyperkeratosis  Orthopedic: He has no tenderness to palpation noted about the surgical site.   Pathology results showed osteomyelitis with no residual infection noted at margin      Assessment:   1. Ulcer of left foot with fat layer exposed ([place])       Plan:  Patient was evaluated and treated and all questions answered.  Ulcer left foot -Wounds remain healed. Continue regulary bathing. I will see him back in 10 weeks for re-evaluation and at risk diabetic foot care

## 2022-04-21 ENCOUNTER — Encounter (INDEPENDENT_AMBULATORY_CARE_PROVIDER_SITE_OTHER): Payer: Medicare Other | Admitting: Ophthalmology

## 2022-04-21 ENCOUNTER — Encounter (INDEPENDENT_AMBULATORY_CARE_PROVIDER_SITE_OTHER): Payer: Self-pay

## 2022-04-21 DIAGNOSIS — E113593 Type 2 diabetes mellitus with proliferative diabetic retinopathy without macular edema, bilateral: Secondary | ICD-10-CM | POA: Diagnosis not present

## 2022-04-21 DIAGNOSIS — H35372 Puckering of macula, left eye: Secondary | ICD-10-CM | POA: Diagnosis not present

## 2022-04-21 DIAGNOSIS — H353132 Nonexudative age-related macular degeneration, bilateral, intermediate dry stage: Secondary | ICD-10-CM | POA: Diagnosis not present

## 2022-04-26 ENCOUNTER — Inpatient Hospital Stay: Payer: Medicare Other | Attending: Physician Assistant

## 2022-04-26 ENCOUNTER — Other Ambulatory Visit: Payer: Self-pay

## 2022-04-26 ENCOUNTER — Other Ambulatory Visit: Payer: Self-pay | Admitting: Hematology and Oncology

## 2022-04-26 ENCOUNTER — Inpatient Hospital Stay (HOSPITAL_BASED_OUTPATIENT_CLINIC_OR_DEPARTMENT_OTHER): Payer: Medicare Other | Admitting: Hematology and Oncology

## 2022-04-26 VITALS — BP 132/59 | HR 76 | Temp 98.3°F

## 2022-04-26 DIAGNOSIS — Z87891 Personal history of nicotine dependence: Secondary | ICD-10-CM | POA: Diagnosis not present

## 2022-04-26 DIAGNOSIS — D472 Monoclonal gammopathy: Secondary | ICD-10-CM

## 2022-04-26 DIAGNOSIS — N183 Chronic kidney disease, stage 3 unspecified: Secondary | ICD-10-CM | POA: Diagnosis not present

## 2022-04-26 DIAGNOSIS — D649 Anemia, unspecified: Secondary | ICD-10-CM

## 2022-04-26 DIAGNOSIS — Z809 Family history of malignant neoplasm, unspecified: Secondary | ICD-10-CM | POA: Insufficient documentation

## 2022-04-26 DIAGNOSIS — E1122 Type 2 diabetes mellitus with diabetic chronic kidney disease: Secondary | ICD-10-CM | POA: Insufficient documentation

## 2022-04-26 DIAGNOSIS — I129 Hypertensive chronic kidney disease with stage 1 through stage 4 chronic kidney disease, or unspecified chronic kidney disease: Secondary | ICD-10-CM | POA: Diagnosis not present

## 2022-04-26 LAB — CMP (CANCER CENTER ONLY)
ALT: 29 U/L (ref 0–44)
AST: 23 U/L (ref 15–41)
Albumin: 4 g/dL (ref 3.5–5.0)
Alkaline Phosphatase: 86 U/L (ref 38–126)
Anion gap: 6 (ref 5–15)
BUN: 69 mg/dL — ABNORMAL HIGH (ref 8–23)
CO2: 28 mmol/L (ref 22–32)
Calcium: 10.1 mg/dL (ref 8.9–10.3)
Chloride: 106 mmol/L (ref 98–111)
Creatinine: 2.12 mg/dL — ABNORMAL HIGH (ref 0.61–1.24)
GFR, Estimated: 30 mL/min — ABNORMAL LOW (ref >60)
Glucose, Bld: 114 mg/dL — ABNORMAL HIGH (ref 70–99)
Potassium: 3.9 mmol/L (ref 3.5–5.1)
Sodium: 140 mmol/L (ref 135–145)
Total Bilirubin: 0.4 mg/dL (ref 0.3–1.2)
Total Protein: 7.8 g/dL (ref 6.5–8.1)

## 2022-04-26 LAB — CBC WITH DIFFERENTIAL (CANCER CENTER ONLY)
Abs Immature Granulocytes: 0.01 10*3/uL (ref 0.00–0.07)
Basophils Absolute: 0 10*3/uL (ref 0.0–0.1)
Basophils Relative: 1 %
Eosinophils Absolute: 0.3 10*3/uL (ref 0.0–0.5)
Eosinophils Relative: 5 %
HCT: 34.6 % — ABNORMAL LOW (ref 39.0–52.0)
Hemoglobin: 11.6 g/dL — ABNORMAL LOW (ref 13.0–17.0)
Immature Granulocytes: 0 %
Lymphocytes Relative: 20 %
Lymphs Abs: 1.2 10*3/uL (ref 0.7–4.0)
MCH: 29.1 pg (ref 26.0–34.0)
MCHC: 33.5 g/dL (ref 30.0–36.0)
MCV: 86.9 fL (ref 80.0–100.0)
Monocytes Absolute: 0.5 10*3/uL (ref 0.1–1.0)
Monocytes Relative: 8 %
Neutro Abs: 4.1 10*3/uL (ref 1.7–7.7)
Neutrophils Relative %: 66 %
Platelet Count: 184 10*3/uL (ref 150–400)
RBC: 3.98 MIL/uL — ABNORMAL LOW (ref 4.22–5.81)
RDW: 15.9 % — ABNORMAL HIGH (ref 11.5–15.5)
WBC Count: 6.2 10*3/uL (ref 4.0–10.5)
nRBC: 0 % (ref 0.0–0.2)

## 2022-04-26 LAB — LACTATE DEHYDROGENASE: LDH: 144 U/L (ref 98–192)

## 2022-04-26 NOTE — Progress Notes (Signed)
[place] [phone]   [phone]  PROGRESS NOTE  Patient Care Team: [name], [name] as PCP - General ([place]) [name], [name], MD as PCP - Cardiology (Cardiology) Rex Kras, Claudette Stapler, RN as [place] Management Pearson, Sharyn Blitz, [facility] (Pharmacist)  CHIEF COMPLAINTS/PURPOSE OF CONSULTATION:  MGUS  HISTORY OF PRESENTING ILLNESS:  Micheal Walls 86 y.o. male returns for a follow up for monoclonal gammopathy after completing initial workup. He is unaccompanied for this visit.   Micheal Walls reports he has been at his baseline level of health in the interim since her last visit.  He has had no new medications, hospital visits, or emergency room visits.  He reports his energy is good and currently ranks it as a 6 or 7 out of 10.  He notes he is not having any new bone or back pain.  He has not noticed any changes in his urinary symptoms such as bubbling urine, change in the color, or change in the frequency.  He reports he is eating well.  He is not having any overt signs of bleeding or bruising.  He continues to follow with his nephrologist and has had no recent changes.  He denies easy bruising or signs of active bleeding. He denies any fevers, chills, night sweats, shortness of breath, chest pain or cough.  He has no other complaints.  Rest of the 10 point ROS is below.  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   Anemia    Aspiration pneumonia ([place]) 5/63/8756   Diastolic heart failure (HCC)    Diverticulosis    DM (diabetes mellitus) ([place])    Gout    Hyperlipemia    OSA on CPAP    RBBB    Sepsis ([place]) 11/02/2019   Systemic hypertension     SURGICAL HISTORY: Past Surgical History:  Procedure Laterality Date   ABDOMINAL AORTOGRAM W/LOWER EXTREMITY Left 08/11/2020   Procedure: ABDOMINAL AORTOGRAM W/LOWER EXTREMITY;   Surgeon: Serafina Mitchell, MD;  Location: [place] [facility];  Service: Cardiovascular;  Laterality: Left;   ABDOMINAL AORTOGRAM W/LOWER EXTREMITY N/A 02/26/2021   Procedure: ABDOMINAL AORTOGRAM W/LOWER EXTREMITY;  Surgeon: Cherre Robins, MD;  Location: [place];  Service: Cardiovascular;  Laterality: N/A;   AMPUTATION Left 09/14/2020   Procedure: AMPUTATION LEFT GREAT TOE;  Surgeon: [name], DPM;  Location: [place];  Service: Podiatry;  Laterality: Left;   AMPUTATION TOE Left 12/03/2021   Procedure: AMPUTATION TOE, LEFT second;  Surgeon: Lorenda Peck, DPM;  Location: [place];  Service: Podiatry;  Laterality: Left;   LEG AMPUTATION BELOW KNEE     right   NM MYOCAR PERF WALL MOTION  09/18/2009   no ischemia   PERIPHERAL VASCULAR BALLOON ANGIOPLASTY Left 08/11/2020   Procedure: PERIPHERAL VASCULAR BALLOON ANGIOPLASTY;  Surgeon: Serafina Mitchell, MD;  Location: [facility];  Service: Cardiovascular;  Laterality: Left;  AT   PERIPHERAL VASCULAR INTERVENTION Left 02/26/2021   Procedure: PERIPHERAL VASCULAR INTERVENTION;  Surgeon: Cherre Robins, MD;  Location: [place] [facility];  Service: Cardiovascular;  Laterality: Left;    SOCIAL HISTORY: Social History   Socioeconomic History   Marital status: Married    Spouse name: Not on file   Number of children: Not on file   Years of education: Not on file   Highest education level:  Not on file  Occupational History   Occupation: retired  Tobacco Use   Smoking status: Former    Types: Cigars    Quit date: 1997    Years since quitting: 26.8   Smokeless tobacco: Never  Vaping Use   Vaping Use: Former  Substance and Sexual Activity   Alcohol use: No    Alcohol/week: 0.0 standard drinks of alcohol   Drug use: No   Sexual activity: Not Currently  Other Topics Concern   Not on file  Social History Narrative   Not on file   Social Determinants of Health   Financial Resource Strain: Low Risk  (05/20/2021)   Overall  Financial Resource Strain (CARDIA)    Difficulty of Paying Living Expenses: Not hard at all  Food Insecurity: No Food Insecurity (01/11/2022)   Hunger Vital Sign    Worried About Running Out of Food in the Last Year: Never true    Ran Out of Food in the Last Year: Never true  Transportation Needs: No Transportation Needs (01/11/2022)   PRAPARE - Transportation    Lack of Transportation (Medical): No    Lack of Transportation (Non-Medical): No  Physical Activity: Inactive (05/20/2021)   Exercise Vital Sign    Days of Exercise per Week: 0 days    Minutes of Exercise per Session: 0 min  Stress: No Stress Concern Present (05/20/2021)   [employer] - Occupational Stress Questionnaire    Feeling of Stress : Not at all  Social Connections: Not on file  Intimate Partner Violence: Unknown (05/15/2019)   Humiliation, Afraid, Rape, and Kick questionnaire    Fear of Current or Ex-Partner: No    Emotionally Abused: Not on file    Physically Abused: Not on file    Sexually Abused: Not on file    FAMILY HISTORY: Family History  Problem Relation Age of Onset   Diabetes Mother    Heart attack Father    Cancer Sister     ALLERGIES:  is allergic to vancomycin.  MEDICATIONS:  Current Outpatient Medications  Medication Sig Dispense Refill   allopurinol (ZYLOPRIM) 100 MG tablet Take 100 mg by mouth 2 (two) times daily.     aspirin EC 81 MG tablet Take 81 mg by mouth daily. Swallow whole.     colchicine 0.6 MG tablet Take 0.6 mg by mouth once a week.     Continuous Blood Gluc Sensor (FREESTYLE LIBRE 14 DAY SENSOR) MISC Use as directed to check blood sugars 6 each 1   diltiazem (CARDIZEM CD) 240 MG 24 hr capsule TAKE ONE CAPSULE BY MOUTH ONCE DAILY 90 capsule 3   dorzolamide (TRUSOPT) 2 % ophthalmic solution Place 1 drop into both eyes 2 (two) times daily.     ferrous sulfate 325 (65 FE) MG tablet Take 1 tablet (325 mg total) by mouth 2 (two) times daily with a meal.  3    furosemide (LASIX) 40 MG tablet Take 1 tablet (40 mg total) by mouth 2 (two) times daily. 180 tablet 1   furosemide (LASIX) 40 MG tablet Take 1 tablet in the morning and one in the evening 180 tablet 1   gabapentin (NEURONTIN) 100 MG capsule TAKE TWO CAPSULES BY MOUTH EVERY MORNING and TAKE ONE CAPSULE BY MOUTH AT NOON and TAKE ONE CAPSULE BY MOUTH EVERY EVENING 360 capsule 1   gentamicin ointment (GARAMYCIN) 0.1 % Apply 1 Application topically 3 (three) times daily. 15 g 0   glucose blood (FREESTYLE PRECISION   NEO TEST) test strip Use as instructed 100 each 12   ketotifen (ZADITOR) 0.025 % ophthalmic solution Place 1 drop into both eyes 2 (two) times daily.     latanoprost (XALATAN) 0.005 % ophthalmic solution Place 1 drop into both eyes at bedtime.      Multiple Vitamins-Minerals (MULTIVITAMIN WITH MINERALS) tablet Take 1 tablet by mouth daily.     Multiple Vitamins-Minerals (PRESERVISION AREDS) CAPS Take 2 capsules by mouth daily.     pantoprazole (PROTONIX) 40 MG tablet TAKE ONE TABLET BY MOUTH EVERYDAY AT BEDTIME 90 tablet 1   rosuvastatin (CRESTOR) 20 MG tablet Take 1 tablet (20 mg total) by mouth daily. 90 tablet 3   Semaglutide, [name], (OZEMPIC, [name],) 2 MG/1.5ML SOPN Inject 1 mg into the skin once a week. 9 mL 1   senna-docusate (SENOKOT-S) 8.6-50 MG tablet Take 1 tablet by mouth 2 (two) times daily between meals as needed for mild constipation or moderate constipation. (Patient taking differently: Take 1 tablet by mouth 2 (two) times daily as needed (constipation).) 60 tablet 0   SYNTHROID 25 MCG tablet Take 1 tablet by mouth Monday - Friday 60 tablet 2   tamsulosin (FLOMAX) 0.4 MG CAPS capsule Take 0.4 mg by mouth daily.     No current facility-administered medications for this visit.    REVIEW OF SYSTEMS:   Constitutional: ( - ) fevers, ( - )  chills , ( - ) night sweats Eyes: ( - ) blurriness of vision, ( - ) double vision, ( - ) watery eyes Ears, nose, mouth, throat, and  face: ( - ) mucositis, ( - ) sore throat Respiratory: ( - ) cough, ( - ) dyspnea, ( - ) wheezes Cardiovascular: ( - ) palpitation, ( - ) chest discomfort, ( - ) lower extremity swelling Gastrointestinal:  ( - ) nausea, ( - ) heartburn, ( - ) change in bowel habits Skin: ( - ) abnormal skin rashes Lymphatics: ( - ) new lymphadenopathy, ( - ) easy bruising Neurological: ( - ) numbness, ( - ) tingling, ( - ) new weaknesses Behavioral/Psych: ( - ) mood change, ( - ) new changes  All other systems were reviewed with the patient and are negative.  PHYSICAL EXAMINATION: ECOG PERFORMANCE STATUS: 1 - Symptomatic but completely ambulatory  Vitals:   04/26/22 1123  BP: (!) 132/59  Pulse: 76  Temp: 98.3 F (36.8 C)  SpO2: 99%   Filed Weights    GENERAL: well appearing elderly African-American male in NAD, patient was in wheelchair during visit.   SKIN: skin color, texture, turgor are normal, no rashes or significant lesions EYES: conjunctiva are pink and non-injected, sclera clear LUNGS: clear to auscultation and percussion with normal breathing effort HEART: regular rate & rhythm and no murmurs and no lower extremity edema PSYCH: alert & oriented x 3, fluent speech NEURO: no focal motor/sensory deficits  LABORATORY DATA:  I have reviewed the data as listed    Latest Ref Rng & Units 04/26/2022    9:56 AM 01/17/2022   12:42 PM 01/17/2022   11:44 AM  CBC  WBC 4.0 - 10.5 K/uL 6.2   6.0   Hemoglobin 13.0 - 17.0 g/dL 11.6  10.5  10.7   Hematocrit 39.0 - 52.0 % 34.6  31.6  32.0   Platelets 150 - 400 K/uL 184   170        Latest Ref Rng & Units 04/26/2022    9:56 AM 03/21/2022      2:32 PM 12/13/2021    9:03 AM  CMP  Glucose 70 - 99 mg/dL 114  94  104   BUN 8 - 23 mg/dL 69  56  59   Creatinine 0.61 - 1.24 mg/dL 2.12  1.73  2.03   Sodium 135 - 145 mmol/L 140  142  137   Potassium 3.5 - 5.1 mmol/L 3.9  4.2  4.2   Chloride 98 - 111 mmol/L 106  104  103   CO2 22 - 32 mmol/L 28  24  27    Calcium 8.9 - 10.3 mg/dL 10.1  10.1  10.6   Total Protein 6.5 - 8.1 g/dL 7.8  7.0  8.1   Total Bilirubin 0.3 - 1.2 mg/dL 0.4  0.3  0.4   Alkaline Phos 38 - 126 U/L 86  93  94   AST 15 - 41 U/L 23  30  31   ALT 0 - 44 U/L 29  36  44     RADIOGRAPHIC STUDIES: No results found.  ASSESSMENT & PLAN Micheal Walls is a 86 y.o. male who presents for a follow up for monoclonal gammopathy.   #MGUS: --SPEP from 12/13/2021 detected M protein measuring 0.6 g/dL. Immunofixation revealed monoclonal free lambda light chain. Kappa free light chains 182.3 mg/L, Lambda free light chains 90.3 mg/L and Ratio 2.02.  --Bone survey from 12/30/2021 showed no lytic or destructive lesions.  --Bone marrow biopsy from 01/17/2022 showed 9% plasma cells.  --UPEP from 01/17/2022 showed no evidence of M protein.  -- Labs today show white blood cell count 6.2, hemoglobin 0.6, MCV 86.9, and platelets of 184.  Creatinine is 2.12. --Above findings are most consistent with MGUS.  --RTC in 6 months with repeat labs.   #Chronic kidney disease, stage III: --Under the care of nephrologist, Dr. Kellie Goldsborough at [facility].  --CKD felt to be secondary to diabetes and hypertension --Recommend considering kidney biopsy if there is suspicion for paraproteinemia as underlying cause of CKD.  --Most recent creatinine level has been stable at 2.12.   #Chronic normocytic anemia: --Likely secondary to chronic kidney disease, stage III +/- paraproteinemia.  --Workup from 12/13/2021 showed no evidence of vitamin B12, folate and iron deficiency.  --Most recent labs show stable anemia with Hgb 11.6   No orders of the defined types were placed in this encounter.   All questions were answered. The patient knows to call the clinic with any problems, questions or concerns.  I have spent a total of 30 minutes minutes of face-to-face and non-face-to-face time, preparing to see the patient, obtaining and/or reviewing  separately obtained history, performing a medically appropriate examination, counseling and educating the patient, documenting clinical information in the electronic health record,  and care coordination.   Irene Thayil, PA-C Department of Hematology/Oncology [facility] Phone: [phone] 

## 2022-04-27 LAB — KAPPA/LAMBDA LIGHT CHAINS
Kappa free light chain: 123.1 mg/L — ABNORMAL HIGH (ref 3.3–19.4)
Kappa, lambda light chain ratio: 1.5 (ref 0.26–1.65)
Lambda free light chains: 81.9 mg/L — ABNORMAL HIGH (ref 5.7–26.3)

## 2022-04-27 LAB — BETA 2 MICROGLOBULIN, SERUM: Beta-2 Microglobulin: 4.9 mg/L — ABNORMAL HIGH (ref 0.6–2.4)

## 2022-04-29 LAB — MULTIPLE MYELOMA PANEL, SERUM
Albumin SerPl Elph-Mcnc: 3.7 g/dL (ref 2.9–4.4)
Albumin/Glob SerPl: 1.1 (ref 0.7–1.7)
Alpha 1: 0.2 g/dL (ref 0.0–0.4)
Alpha2 Glob SerPl Elph-Mcnc: 0.6 g/dL (ref 0.4–1.0)
B-Globulin SerPl Elph-Mcnc: 1.2 g/dL (ref 0.7–1.3)
Gamma Glob SerPl Elph-Mcnc: 1.4 g/dL (ref 0.4–1.8)
Globulin, Total: 3.4 g/dL (ref 2.2–3.9)
IgA: 685 mg/dL — ABNORMAL HIGH (ref 61–437)
IgG (Immunoglobin G), Serum: 1438 mg/dL (ref 603–1613)
IgM (Immunoglobulin M), Srm: 54 mg/dL (ref 15–143)
M Protein SerPl Elph-Mcnc: 0.3 g/dL — ABNORMAL HIGH
Total Protein ELP: 7.1 g/dL (ref 6.0–8.5)

## 2022-05-11 ENCOUNTER — Telehealth: Payer: Self-pay

## 2022-05-11 NOTE — Chronic Care Management (AMB) (Signed)
    Micheal Walls was reminded to have all medications, supplements and any blood glucose and blood pressure readings available for review with [name], Pharm. D, at his telephone visit on 05-13-2022 at 10:00.   Questions: Have you had any recent office visit or specialist visit outside of [place]? Patient stated no  Are there any concerns you would like to discuss during your office visit? Patient stated no  Are you having any problems obtaining your medications? (Whether it pharmacy issues or cost) Patient stated no  If patient has any PAP medications ask if they are having any problems getting their PAP medication or refill? No PAP meds  Care Gaps: Covid booster overdue   Star Rating Drug: Rosuvastatin 20 mg- Last filled 04-13-2022 [facility] Ozempic 1 mg- Last filled 12-17-2021 84 DS [facility] (Patient will have to call for refills. Patient has 4 pens left)  Any gaps in medications fill history? No

## 2022-05-13 ENCOUNTER — Telehealth: Payer: Medicare Other

## 2022-05-13 ENCOUNTER — Telehealth: Payer: Self-pay

## 2022-05-13 NOTE — Progress Notes (Incomplete)
Current Barriers:  {pharmacybarriers:24917}  Pharmacist Clinical Goal(s):  Patient will {PHARMACYGOALCHOICES:24921} through collaboration with PharmD and provider.   Interventions: 1:1 collaboration with [name], [name] regarding development and update of comprehensive plan of care as evidenced by provider attestation and co-signature Inter-disciplinary care team collaboration (see longitudinal plan of care) Comprehensive medication review performed; medication list updated in electronic medical record  {CCM [facility] DISEASE STATES:25130}  Patient Goals/Self-Care Activities Patient will:  - {pharmacypatientgoals:24919}  Follow Up Plan: {CM FOLLOW UP VXBL:39030}  Chronic Care Management Pharmacy Note  05/13/2022 Name:  Micheal Walls MRN:  [id number] DOB:  May 13, 1936  Summary: ***  Recommendations/Changes made from today's visit: ***  Plan: ***   Subjective: Micheal Walls is an 86 y.o. year old male who is a primary patient of [name], [place].  The CCM team was consulted for assistance with disease management and care coordination needs.    {CCMTELEPHONEFACETOFACE:21091510} for {CCMINITIALFOLLOWUPCHOICE:21091511} in response to provider referral for pharmacy case management and/or care coordination services.   Consent to Services:  {CCMCONSENTOPTIONS:25074}  Patient Care Team: [name], [name] as PCP - General (General Practice) [name], [name], MD as PCP - Cardiology (Cardiology) Lynne Logan, RN as [place] Management Moriah Loughry, Sharyn Blitz, [facility] (Pharmacist)  Recent office visits: ***  Recent consult visits: [facility] visits: {Hospital DC Yes/No:25215}   Objective:  Lab Results  Component Value Date   CREATININE 2.12 (H) 04/26/2022   BUN 69 (H) 04/26/2022   EGFR 38 (L) 03/21/2022   GFRNONAA 30 (L) 04/26/2022   GFRAA 42 (L) 05/20/2020   NA 140 04/26/2022   K 3.9 04/26/2022   CALCIUM 10.1 04/26/2022   CO2 28  04/26/2022   GLUCOSE 114 (H) 04/26/2022    Lab Results  Component Value Date/Time   HGBA1C 6.1 (H) 03/21/2022 02:32 PM   HGBA1C 5.9 (H) 11/17/2021 10:40 AM   MICROALBUR 30 09/28/2020 03:58 PM   MICROALBUR 80 05/15/2019 10:28 AM    Last diabetic Eye exam:  Lab Results  Component Value Date/Time   HMDIABEYEEXA Retinopathy (A) 06/24/2021 12:00 AM    Last diabetic Foot exam: No results found for: "HMDIABFOOTEX"   Lab Results  Component Value Date   CHOL 96 (L) 11/17/2021   HDL 29 (L) 11/17/2021   LDLCALC 46 11/17/2021   TRIG 115 11/17/2021   CHOLHDL 3.3 11/17/2021       Latest Ref Rng & Units 04/26/2022    9:56 AM 03/21/2022    2:32 PM 12/13/2021    9:03 AM  Hepatic Function  Total Protein 6.5 - 8.1 g/dL 7.8  7.0  8.1   Albumin 3.5 - 5.0 g/dL 4.0  4.0  3.8   AST 15 - 41 U/L _0 ALT 0 - 44 U/L 29  36  44   Alk Phosphatase 38 - 126 U/L 86  93  94   Total Bilirubin 0.3 - 1.2 mg/dL 0.4  0.3  0.4     Lab Results  Component Value Date/Time   TSH 4.140 03/21/2022 02:32 PM   TSH 4.400 08/11/2021 11:37 AM   FREET4 0.94 03/21/2022 02:32 PM   FREET4 0.95 03/30/2021 10:13 AM       Latest Ref Rng & Units 04/26/2022    9:56 AM 01/17/2022   12:42 PM 01/17/2022   11:44 AM  CBC  WBC 4.0 - 10.5 K/uL 6.2   6.0   Hemoglobin 13.0 - 17.0 g/dL 11.6  10.5  10.7   Hematocrit 39.0 - 52.0 % 34.6  31.6  32.0   Platelets 150 - 400 K/uL 184   170     Lab Results  Component Value Date/Time   VITAMINB12 836 12/13/2021 09:20 AM   VITAMINB12 957 02/27/2018 11:30 AM    Clinical ASCVD: {YES/NO:21197} The ASCVD Risk score (Arnett DK, et al., 2019) failed to calculate for the following reasons:   The 2019 ASCVD risk score is only valid for ages 66 to 29   The patient has a prior MI or stroke diagnosis       05/20/2021    9:19 AM 05/20/2020    9:18 AM 09/24/2019    8:45 AM  Depression screen PHQ 2/9  Decreased Interest 0 0 0  Down, Depressed, Hopeless 0 0 0  PHQ - 2 Score 0 0 0      ***Other: (CHADS2VASc if Afib, MMRC or CAT for COPD, ACT, DEXA)  Social History   Tobacco Use  Smoking Status Former   Types: Cigars   Quit date: 1997   Years since quitting: 26.9  Smokeless Tobacco Never   BP Readings from Last 3 Encounters:  04/26/22 (!) 132/59  03/21/22 138/62  01/17/22 (!) 147/69   Pulse Readings from Last 3 Encounters:  04/26/22 76  03/21/22 75  01/21/22 72   Wt Readings from Last 3 Encounters:  01/21/22 241 lb 6.4 oz (109.5 kg)  01/17/22 253 lb (114.8 kg)  12/02/21 242 lb 10.9 oz (110.1 kg)   BMI Readings from Last 3 Encounters:  01/21/22 32.74 kg/m  01/17/22 34.31 kg/m  12/13/21 32.91 kg/m    Assessment/Interventions: Review of patient past medical history, allergies, medications, health status, including review of consultants reports, laboratory and other test data, was performed as part of comprehensive evaluation and provision of chronic care management services.   SDOH:  (Social Determinants of Health) assessments and interventions performed: {yes/no:20286} SDOH Interventions    Flowsheet Row Care Coordination from 01/11/2022 in [place] from 05/15/2019 in [facility]  SDOH Interventions    Food Insecurity Interventions Intervention Not Indicated --  Housing Interventions Intervention Not Indicated --  Transportation Interventions Intervention Not Indicated --  Depression Interventions/Treatment  -- GXQ1-1 Score <4 Follow-up Not Indicated      SDOH Screenings   Food Insecurity: No Food Insecurity (01/11/2022)  Housing: Low Risk  (01/11/2022)  Transportation Needs: No Transportation Needs (01/11/2022)  Depression (PHQ2-9): Low Risk  (05/20/2021)  Financial Resource Strain: Low Risk  (05/20/2021)  Physical Activity: Inactive (05/20/2021)  Stress: No Stress Concern Present (05/20/2021)  Tobacco Use: Medium Risk (03/21/2022)    [place]  Allergies   Allergen Reactions   Vancomycin Rash    Medications Reviewed Today     Reviewed by Lynne Logan, RN (Registered Nurse) on 03/30/22 at [place] List Status: <None>   Medication Order Taking? Sig Documenting Provider Last Dose Status Informant  allopurinol (ZYLOPRIM) 100 MG tablet [id number] No Take 100 mg by mouth 2 (two) times daily. [provider] Taking Active Child, Pharmacy Records           Med Note Fransico Him Oct 22, 2020  9:51 AM)    aspirin EC 81 MG tablet [id number] No Take 81 mg by mouth daily. Swallow whole. [provider] Taking Active Child, Pharmacy Records  colchicine 0.6 MG tablet [id number] No Take 0.6 mg by mouth once a week.  [provider] Taking Active Self, Child, Pharmacy Records  Continuous Blood Gluc Sensor (FREESTYLE LIBRE [place]) [place] [id number] No Use as directed to check blood sugars [name], [name] Taking Active   diltiazem (CARDIZEM CD) 240 MG 24 hr capsule [id number] No TAKE ONE CAPSULE BY MOUTH ONCE DAILY [name], [name], MD Taking Active   dorzolamide (TRUSOPT) 2 % ophthalmic solution [id number] No Place 1 drop into both eyes 2 (two) times daily. [provider] Taking Active Child, Pharmacy Records  ferrous sulfate 325 (65 FE) MG tablet [id number] No Take 1 tablet (325 mg total) by mouth 2 (two) times daily with a meal. Nita Sells, MD Taking Active   furosemide (LASIX) 40 MG tablet [id number] No Take 1 tablet (40 mg total) by mouth 2 (two) times daily. [name], [name] Taking Active   furosemide (LASIX) 40 MG tablet [id number] No Take 1 tablet in the morning and one in the evening [name], [name] Taking Active   gabapentin (NEURONTIN) 100 MG capsule [id number] No TAKE TWO CAPSULES BY MOUTH EVERY MORNING and TAKE ONE CAPSULE BY MOUTH AT NOON and TAKE ONE CAPSULE BY MOUTH EVERY EVENING  Patient taking differently: Take 100-200 mg by mouth See admin instructions. Take two capsules by mouth  every morning and then take one capsule by mouth at noon and one capsule by mouth every evening.   [name], [name] Taking Active Child, Pharmacy Records  gentamicin ointment (GARAMYCIN) 0.1 % [id number]  Apply 1 Application topically 3 (three) times daily. Lorenda Peck, DPM  Active   glucose blood (FREESTYLE PRECISION NEO TEST) test strip [id number] No Use as instructed [name], [name] Taking Active Child, Pharmacy Records  ketotifen (ZADITOR) 0.025 % ophthalmic solution [id number] No Place 1 drop into both eyes 2 (two) times daily. [provider] Taking Active Child, Pharmacy Records  latanoprost (XALATAN) 0.005 % ophthalmic solution [id number] No Place 1 drop into both eyes at bedtime.  [provider] Taking Active Child, Pharmacy Records           Med Note Fransico Him Oct 22, 2020 10:00 AM)    Multiple Vitamins-Minerals (MULTIVITAMIN WITH MINERALS) tablet [id number] No Take 1 tablet by mouth daily. [provider] Taking Active Child, Pharmacy Records  Multiple Vitamins-Minerals (PRESERVISION AREDS) CAPS [id number] No Take 2 capsules by mouth daily. [provider] Taking Active Self, Child, Pharmacy Records  pantoprazole (PROTONIX) 40 MG tablet [id number] No TAKE ONE TABLET BY MOUTH EVERYDAY AT BEDTIME [name], [name] Taking Active   rosuvastatin (CRESTOR) 20 MG tablet [id number] No Take 1 tablet (20 mg total) by mouth daily. [name], [name], MD Taking Active Child, Pharmacy Records  Semaglutide, [name], (OZEMPIC, [name],) 2 MG/1.5ML SOPN [id number] No Inject 1 mg into the skin once a week. [name], [name] Taking Active   senna-docusate (SENOKOT-S) 8.6-50 MG tablet [id number] No Take 1 tablet by mouth 2 (two) times daily between meals as needed for mild constipation or moderate constipation.  Patient taking differently: Take 1 tablet by mouth 2 (two) times daily as needed (constipation).   Mercy Riding, MD Taking Active Child, Pharmacy  Records  SYNTHROID 25 MCG tablet [id number] No Take 1 tablet by mouth Monday - Friday  Patient taking differently: Take 25 mcg by mouth See admin instructions. Take 25 mcg by mouth Monday through Friday.   [name], [name] Taking Active Child, Pharmacy Records  tamsulosin [facility]) 0.4 MG CAPS capsule [id number] No Take 0.4 mg by  mouth daily. [provider] Taking Active Child, Pharmacy Records            Patient Active Problem List   Diagnosis Date Noted   MGUS (monoclonal gammopathy of unknown significance) 01/24/2022   Normocytic anemia 12/13/2021   Toe osteomyelitis, left in setting of PAD and T2DM  12/02/2021   Proliferative diabetic retinopathy of left eye ([place]) 10/18/2021   Chronic diastolic (congestive) heart failure ([place]) 08/11/2021   Pseudophakia of both eyes 04/12/2021   Infection of amputation stump of left lower extremity ([place]) 10/21/2020   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[place])    Left ankle swelling    Diabetic foot infection ([place])    Cerebral thrombosis with cerebral infarction 07/20/2020   Thrombocytopenia ([place]) 07/19/2020   Cerebral ischemia 07/18/2020   Bilateral hearing loss 05/20/2020   Does use hearing aid 05/20/2020   Ulcer of left foot with fat layer exposed ([place]) 01/16/2020   Proliferative diabetic retinopathy of right eye ([place]) 01/06/2020   Intermediate stage nonexudative age-related macular degeneration of both eyes 01/06/2020   Left epiretinal membrane 01/06/2020   [place] of right macula 01/06/2020   Cellulitis 11/02/2019   Elevated TSH 07/10/2018   Nephropathy 02/28/2018   Disturbance of skin sensation 02/28/2018   PAD (peripheral artery disease) ([place]) 07/17/2017   110.1 10/21/2013   Epiphora 04/24/2013   Laxity of eyelid 04/24/2013   Anemia 04/09/2013   History of peripheral vascular disease 04/09/2013   History of stroke 04/09/2013   S/P unilateral BKA (below knee amputation) ([place]) 03/17/2013   Diabetes mellitus type 2 in obese ([place])  03/17/2013   Mixed hyperlipidemia 03/17/2013   Essential hypertension 03/17/2013   Gout 03/17/2013   CKD (chronic kidney disease), stage III ([place]) 03/17/2013   OSA on CPAP 03/17/2013   Anemia, iron deficiency 03/17/2013   Diverticulosis of colon 03/17/2013   RBBB 03/17/2013   Punctal stenosis, acquired 10/02/2012    Immunization History  Administered Date(s) Administered   Fluad Quad(high Dose 65+) 05/20/2020, 04/09/2021, 03/21/2022   Influenza, High Dose Seasonal PF 03/21/2019   PFIZER(Purple Top)SARS-COV-2 Vaccination 08/31/2019, 09/25/2019, 04/03/2020, 06/22/2021   Pfizer Covid-19 Vaccine Bivalent Booster 72yr & up 06/22/2021   Pneumococcal Conjugate-13 02/23/2016   Pneumococcal Polysaccharide-23 05/15/2019   Tdap 02/04/2013   Zoster Recombinat (Shingrix) 06/22/2021, 09/28/2021    Conditions to be addressed/monitored:  {USCCMDZASSESSMENTOPTIONS:23563}  There are no care plans that you recently modified to display for this patient.    Medication Assistance: {MEDASSISTANCEINFO:25044}  Compliance/Adherence/Medication fill history: Care Gaps: ***  Star-Rating Drugs: ***  Patient's preferred pharmacy is:  [id number] GRL[address] GaryNCLittle RockAEileen StanfordC [id number]: [phone]: : [phone]: [phone]: [phone] [place] [address] [place]one: [phone]: [phone] pill box? {Yes or If no, why not?:20788} Pt endorses ***% compliance  We discussed: {Pharmacy options:24294} Patient decided to: {US Pharmacy Plan:23885}  Care Plan and Follow Up Patient Decision:  {FOLLOWUP:24991}  Plan: {CM FOLLOW UP PLAN:25073}  ***

## 2022-05-26 ENCOUNTER — Ambulatory Visit (INDEPENDENT_AMBULATORY_CARE_PROVIDER_SITE_OTHER): Payer: Medicare Other

## 2022-05-26 VITALS — BP 130/60 | HR 57 | Temp 97.9°F

## 2022-05-26 DIAGNOSIS — Z0000 Encounter for general adult medical examination without abnormal findings: Secondary | ICD-10-CM | POA: Diagnosis not present

## 2022-05-26 NOTE — Progress Notes (Signed)
Subjective:   Micheal Walls is a 86 y.o. male who presents for Medicare Annual/Subsequent preventive examination.  Review of Systems     Cardiac Risk Factors include: advanced age (>8mn, >>57women);diabetes mellitus;dyslipidemia;hypertension;male gender;obesity (BMI >30kg/m2)     Objective:    Today's Vitals   05/26/22 1155  BP: 130/60  Pulse: (!) 57  Temp: 97.9 F (36.6 C)  TempSrc: Oral  SpO2: 98%   There is no height or weight on file to calculate BMI.     05/26/2022   12:04 PM 01/17/2022    7:56 AM 01/11/2022    1:36 PM 05/20/2021    9:16 AM 02/26/2021    6:29 AM 10/21/2020    6:43 PM 09/16/2020    4:27 PM  Advanced Directives  Does Patient Have a Medical Advance Directive? Yes No No No Yes No No  Type of Advance Directive Healthcare Power of [place]   Does patient want to make changes to medical advance directive?     No - Patient declined    [place]? No - copy requested        Would patient like information on creating a medical advance directive?  No - Patient declined No - Patient declined No - Patient declined  No - Patient declined No - Patient declined    Current Medications (verified) Outpatient Encounter Medications as of 05/26/2022  Medication Sig   allopurinol (ZYLOPRIM) 100 MG tablet Take 100 mg by mouth 2 (two) times daily.   aspirin EC 81 MG tablet Take 81 mg by mouth daily. Swallow whole.   colchicine 0.6 MG tablet Take 0.6 mg by mouth once a week.   Continuous Blood Gluc Sensor (FREESTYLE LIBRE 14 DAY SENSOR) MISC Use as directed to check blood sugars   diltiazem (CARDIZEM CD) 240 MG 24 hr capsule TAKE ONE CAPSULE BY MOUTH ONCE DAILY   dorzolamide (TRUSOPT) 2 % ophthalmic solution Place 1 drop into both eyes 2 (two) times daily.   ferrous sulfate 325 (65 FE) MG tablet Take 1 tablet (325 mg total) by mouth 2 (two) times daily with a meal.   furosemide (LASIX) 40 MG tablet Take 1  tablet (40 mg total) by mouth 2 (two) times daily.   gabapentin (NEURONTIN) 100 MG capsule TAKE TWO CAPSULES BY MOUTH EVERY MORNING and TAKE ONE CAPSULE BY MOUTH AT NOON and TAKE ONE CAPSULE BY MOUTH EVERY EVENING   gentamicin ointment (GARAMYCIN) 0.1 % Apply 1 Application topically 3 (three) times daily.   glucose blood (FREESTYLE PRECISION NEO TEST) test strip Use as instructed   ketotifen (ZADITOR) 0.025 % ophthalmic solution Place 1 drop into both eyes 2 (two) times daily.   latanoprost (XALATAN) 0.005 % ophthalmic solution Place 1 drop into both eyes at bedtime.    Multiple Vitamins-Minerals (MULTIVITAMIN WITH MINERALS) tablet Take 1 tablet by mouth daily.   Multiple Vitamins-Minerals (PRESERVISION AREDS) CAPS Take 2 capsules by mouth daily.   pantoprazole (PROTONIX) 40 MG tablet TAKE ONE TABLET BY MOUTH EVERYDAY AT BEDTIME   rosuvastatin (CRESTOR) 20 MG tablet Take 1 tablet (20 mg total) by mouth daily.   Semaglutide, [name], (OZEMPIC, [name],) 2 MG/1.5ML SOPN Inject 1 mg into the skin once a week.   senna-docusate (SENOKOT-S) 8.6-50 MG tablet Take 1 tablet by mouth 2 (two) times daily between meals as needed for mild constipation or moderate constipation. (Patient taking differently: Take 1  tablet by mouth 2 (two) times daily as needed (constipation).)   SYNTHROID 25 MCG tablet Take 1 tablet by mouth Monday - Friday   tamsulosin (FLOMAX) 0.4 MG CAPS capsule Take 0.4 mg by mouth daily.   furosemide (LASIX) 40 MG tablet Take 1 tablet in the morning and one in the evening   No facility-administered encounter medications on file as of 05/26/2022.    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   Anemia    Aspiration pneumonia ([place]) 1/70/0174   Diastolic heart failure (HCC)    Diverticulosis    DM (diabetes mellitus) ([place])    Gout    Hyperlipemia    OSA on  CPAP    RBBB    Sepsis ([place]) 11/02/2019   Systemic hypertension    Past Surgical History:  Procedure Laterality Date   ABDOMINAL AORTOGRAM W/LOWER EXTREMITY Left 08/11/2020   Procedure: ABDOMINAL AORTOGRAM W/LOWER EXTREMITY;  Surgeon: Serafina Mitchell, MD;  Location: [place] [facility];  Service: Cardiovascular;  Laterality: Left;   ABDOMINAL AORTOGRAM W/LOWER EXTREMITY N/A 02/26/2021   Procedure: ABDOMINAL AORTOGRAM W/LOWER EXTREMITY;  Surgeon: Cherre Robins, MD;  Location: [place] [facility];  Service: Cardiovascular;  Laterality: N/A;   AMPUTATION Left 09/14/2020   Procedure: AMPUTATION LEFT GREAT TOE;  Surgeon: [name], DPM;  Location: [place];  Service: Podiatry;  Laterality: Left;   AMPUTATION TOE Left 12/03/2021   Procedure: AMPUTATION TOE, LEFT second;  Surgeon: Lorenda Peck, DPM;  Location: [place];  Service: Podiatry;  Laterality: Left;   LEG AMPUTATION BELOW KNEE     right   NM MYOCAR PERF WALL MOTION  09/18/2009   no ischemia   PERIPHERAL VASCULAR BALLOON ANGIOPLASTY Left 08/11/2020   Procedure: PERIPHERAL VASCULAR BALLOON ANGIOPLASTY;  Surgeon: Serafina Mitchell, MD;  Location: [place] [facility];  Service: Cardiovascular;  Laterality: Left;  AT   PERIPHERAL VASCULAR INTERVENTION Left 02/26/2021   Procedure: PERIPHERAL VASCULAR INTERVENTION;  Surgeon: Cherre Robins, MD;  Location: [place] [facility];  Service: Cardiovascular;  Laterality: Left;   Family History  Problem Relation Age of Onset   Diabetes Mother    Heart attack Father    Cancer Sister    Social History   Socioeconomic History   Marital status: Married    Spouse name: Not on file   Number of children: Not on file   Years of education: Not on file   Highest education level: Not on file  Occupational History   Occupation: retired  Tobacco Use   Smoking status: Former    Types: Cigars    Quit date: 1997    Years since quitting: 26.9   Smokeless tobacco: Never  Vaping Use   Vaping Use: Former   Substance and Sexual Activity   Alcohol use: No    Alcohol/week: 0.0 standard drinks of alcohol   Drug use: No   Sexual activity: Not Currently  Other Topics Concern   Not on file  Social History Narrative   Not on file   Social Determinants of Health   Financial Resource Strain: [place]  (05/26/2022)   Overall Financial Resource Strain (CARDIA)    Difficulty of Paying Living Expenses: Not hard at all  Food Insecurity: No Food Insecurity (05/26/2022)   Hunger Vital Sign    Worried About Running Out of Food in the Last  Year: Never true    [place] in the Last Year: Never true  Transportation Needs: No Transportation Needs (05/26/2022)   PRAPARE - Hydrologist (Medical): No    Lack of Transportation (Non-Medical): No  Physical Activity: Inactive (05/26/2022)   Exercise Vital Sign    Days of Exercise per Week: 0 days    Minutes of Exercise per Session: 0 min  Stress: No Stress Concern Present (05/26/2022)   [place]    Feeling of Stress : Not at all  Social Connections: Not on file    Tobacco Counseling Counseling given: Not Answered   Clinical Intake:  Pre-visit preparation completed: Yes  Pain : No/denies pain     Nutritional Status: BMI > 30  Obese Nutritional Risks: None Diabetes: Yes  How often do you need to have someone help you when you read instructions, pamphlets, or other written materials from your doctor or pharmacy?: 1 - Never  Diabetic? Yes Nutrition Risk Assessment:  Has the patient had any N/V/D within the last 2 months?  No  Does the patient have any non-healing wounds?  No  Has the patient had any unintentional weight loss or weight gain?  No   Diabetes:  Is the patient diabetic?  Yes  If diabetic, was a CBG obtained today?  No  Did the patient bring in their glucometer from home?  No  How often do you monitor your CBG's? frequently.    Financial Strains and Diabetes Management:  Are you having any financial strains with the device, your supplies or your medication? No .  Does the patient want to be seen by Chronic Care Management for management of their diabetes?  No  Would the patient like to be referred to a Nutritionist or for Diabetic Management?  No   Diabetic Exams:  Diabetic Eye Exam: Completed 06/24/2021 Diabetic Foot Exam: Completed 11/17/2021   Interpreter Needed?: No  Information entered by :: NAllen LPN   Activities of Daily Living    05/26/2022   12:05 PM 05/25/2022    2:25 PM  In your present state of health, do you have any difficulty performing the following activities:  Hearing? 1 1  Comment has hearing aids   Vision? 0 0  Difficulty concentrating or making decisions? 0 0  Walking or climbing stairs? 1 1  Dressing or bathing? 0 0  Doing errands, shopping? 0 0  Preparing Food and eating ? N N  Using the Toilet? N N  In the past six months, have you accidently leaked urine? N N  Do you have problems with loss of bowel control? N N  Managing your Medications? N N  Managing your Finances? N N  Housekeeping or managing your Housekeeping? N N    Patient Care Team: [name], [name] as PCP - General (General Practice) [name], [name], MD as PCP - Cardiology (Cardiology) Rex Kras Claudette Stapler, RN as [place], Sharyn Blitz, [facility] (Pharmacist)  Indicate any recent Medical Services you may have received from other than [facility] providers in the past year (date may be approximate).     Assessment:   This is a routine wellness examination for [place].  Hearing/Vision screen Vision Screening - Comments:: Regular eye exams, Dr. Zadie Rhine  Dietary issues and exercise activities discussed: Current Exercise Habits: The patient does not participate in regular exercise at present   Goals Addressed  This Visit's Progress    Patient Stated        05/26/2022, no goals       Depression Screen    05/26/2022   12:05 PM 05/20/2021    9:19 AM 05/20/2020    9:18 AM 09/24/2019    8:45 AM 06/25/2019    9:14 AM 05/15/2019    9:42 AM 03/21/2019    9:02 AM  PHQ 2/9 Scores  PHQ - 2 Score 0 0 0 0 0 0 0  PHQ- 9 Score      3     Fall Risk    05/26/2022   12:05 PM 05/25/2022    2:25 PM 01/11/2022    1:39 PM 05/20/2021    9:18 AM 05/20/2020    9:17 AM  [place] in the past year? 0 0 0 1 1  Comment    leg gave out had no energy  Number falls in past yr: 0 0 0 0 0  Injury with Fall? 0 0 0 0 1  Risk for fall due to : Medication side effect;Impaired mobility;Impaired balance/gait   Impaired balance/gait;Impaired mobility;Medication side effect Impaired mobility;Medication side effect  Follow up Falls prevention discussed;Education provided;Falls evaluation completed   Falls evaluation completed;Education provided;Falls prevention discussed Falls evaluation completed;Education provided;Falls prevention discussed    FALL RISK PREVENTION PERTAINING TO THE HOME:  Any stairs in or around the home? No  If so, are there any without handrails?  ramp Home free of loose throw rugs in walkways, pet beds, electrical cords, etc? Yes  Adequate lighting in your home to reduce risk of falls? Yes   ASSISTIVE DEVICES UTILIZED TO PREVENT FALLS:  Life alert? No  Use of a cane, walker or w/c? Yes  Grab bars in the bathroom? Yes  Shower chair or bench in shower? Yes  Elevated toilet seat or a handicapped toilet? Yes   TIMED UP AND GO:  Was the test performed? No .    wheelchair  Cognitive Function:        05/26/2022   12:06 PM 05/20/2021    9:20 AM 05/20/2020    9:20 AM 05/15/2019    9:46 AM 03/15/2018    9:22 AM  6CIT Screen  What Year? 0 points 0 points 0 points 0 points 0 points  What month? 0 points 0 points 0 points 0 points 0 points  What time? 0 points 0 points 0 points 0 points 0 points  Count back from 20 0 points 0 points 2  points 0 points 0 points  Months in reverse 0 points 0 points 0 points 0 points 0 points  Repeat phrase 2 points 6 points 0 points 2 points 2 points  Total Score 2 points 6 points 2 points 2 points 2 points    Immunizations Immunization History  Administered Date(s) Administered   Fluad Quad(high Dose 65+) 05/20/2020, 04/09/2021, 03/21/2022   Influenza, High Dose Seasonal PF 03/21/2019   PFIZER(Purple Top)SARS-COV-2 Vaccination 08/31/2019, 09/25/2019, 04/03/2020, 06/22/2021   Pfizer Covid-19 Vaccine Bivalent Booster 26yr & up 06/22/2021   Pneumococcal Conjugate-13 02/23/2016   Pneumococcal Polysaccharide-23 05/15/2019   Tdap 02/04/2013   Zoster Recombinat (Shingrix) 06/22/2021, 09/28/2021    TDAP status: Up to date  Flu Vaccine status: Up to date  Pneumococcal vaccine status: Up to date  Covid-19 vaccine status: Completed vaccines  Qualifies for Shingles Vaccine? Yes   Zostavax completed Yes   Shingrix Completed?: Yes  Screening Tests Health Maintenance  Topic Date  Due   COVID-19 Vaccine (5 - 2023-24 season) 02/11/2022   OPHTHALMOLOGY EXAM  06/24/2022   Diabetic kidney evaluation - Urine ACR  08/12/2022   HEMOGLOBIN A1C  09/20/2022   FOOT EXAM  11/18/2022   DTaP/Tdap/Td (2 - Td or Tdap) 02/05/2023   Diabetic kidney evaluation - eGFR measurement  04/27/2023   Medicare Annual Wellness (AWV)  05/27/2023   Pneumonia Vaccine 23+ Years old  Completed   INFLUENZA VACCINE  Completed   Zoster Vaccines- Shingrix  Completed   HPV VACCINES  Aged Out    Health Maintenance  Health Maintenance Due  Topic Date Due   COVID-19 Vaccine (5 - 2023-24 season) 02/11/2022    Colorectal cancer screening: No longer required.   Lung Cancer Screening: (Low Dose CT Chest recommended if Age 26-80 years, 30 pack-year currently smoking OR have quit w/in 15years.) does not qualify.   Lung Cancer Screening Referral: no  Additional Screening:  Hepatitis C Screening: does not qualify;    Vision Screening: Recommended annual ophthalmology exams for early detection of glaucoma and other disorders of the eye. Is the patient up to date with their annual eye exam?  Yes  Who is the provider or what is the name of the office in which the patient attends annual eye exams? Dr. Zadie Rhine If pt is not established with a provider, would they like to be referred to a provider to establish care? No .   Dental Screening: Recommended annual dental exams for proper oral hygiene  Community Resource Referral / Chronic Care Management: CRR required this visit?  No   CCM required this visit?  No      Plan:     I have personally reviewed and noted the following in the patient's chart:   Medical and social history Use of alcohol, tobacco or illicit drugs  Current medications and supplements including opioid prescriptions. Patient is not currently taking opioid prescriptions. Functional ability and status Nutritional status Physical activity Advanced directives List of other physicians Hospitalizations, surgeries, and ER visits in previous 12 months Vitals Screenings to include cognitive, depression, and falls Referrals and appointments  In addition, I have reviewed and discussed with patient certain preventive protocols, quality metrics, and best practice recommendations. A written personalized care plan for preventive services as well as general preventive health recommendations were provided to patient.     Kellie Simmering, LPN   00/92/3300   Nurse Notes: none

## 2022-05-26 NOTE — Patient Instructions (Signed)
[name] , Thank you for taking time to come for your Medicare Wellness Visit. I appreciate your ongoing commitment to your health goals. Please review the following plan we discussed and let me know if I can assist you in the future.   These are the goals we discussed:  Goals       I will drink more water (pt-stated)      Care Coordination Interventions: Assessed the Patient understanding of chronic kidney disease    Evaluation of current treatment plan related to chronic kidney disease self management and patient's adherence to plan as established by provider      Reviewed prescribed diet Increase daily water intake to 48-64 oz daily  Provided education on kidney disease progression            I will work on eating less portions (pt-stated)      Care Coordination Interventions: Provided education to patient about basic DM disease process Reviewed medications with patient and discussed importance of medication adherence Review of patient status, including review of consultants reports, relevant laboratory and other test results, and medications completed Educated on patient on dietary and exercise recommendations Mailed printed educational materials related to Chair Exercises; Carb Choice list; Meal Planning         Manage My Medicine      Timeframe:  Long-Range Goal Priority:  High Start Date:                             Expected End Date:                       Follow Up Date 04/2022   In Progress:  - keep a list of all the medicines I take; vitamins and herbals too - use a pillbox to sort medicine - use an alarm clock or phone to remind me to take my medicine    Why is this important?   These steps will help you keep on track with your medicines.       Patient Stated      05/15/2019, no goals      Patient Stated      05/20/2020, no goals      Patient Stated      05/20/2021, get leg healed up      Patient Stated      05/26/2022, no goals        This is a  list of the screening recommended for you and due dates:  Health Maintenance  Topic Date Due   COVID-19 Vaccine (5 - 2023-24 season) 02/11/2022   Eye exam for diabetics  06/24/2022   Yearly kidney health urinalysis for diabetes  08/12/2022   Hemoglobin A1C  09/20/2022   Complete foot exam   11/18/2022   DTaP/Tdap/Td vaccine (2 - Td or Tdap) 02/05/2023   Yearly kidney function blood test for diabetes  04/27/2023   Medicare Annual Wellness Visit  05/27/2023   Pneumonia Vaccine  Completed   Flu Shot  Completed   Zoster (Shingles) Vaccine  Completed   HPV Vaccine  Aged Out    Advanced directives: Advance directive discussed with you today. Even though you declined this today please call our office should you change your mind and we can give you the proper paperwork for you to fill out.  Conditions/risks identified: none  Next appointment: Follow up in one year for your annual wellness visit.  Preventive Care 49 Years and Older, Male  Preventive care refers to lifestyle choices and visits with your health care provider that can promote health and wellness. What does preventive care include? A yearly physical exam. This is also called an annual well check. Dental exams once or twice a year. Routine eye exams. Ask your health care provider how often you should have your eyes checked. Personal lifestyle choices, including: Daily care of your teeth and gums. Regular physical activity. Eating a healthy diet. Avoiding tobacco and drug use. Limiting alcohol use. Practicing safe sex. Taking low doses of aspirin every day. Taking vitamin and mineral supplements as recommended by your health care provider. What happens during an annual well check? The services and screenings done by your health care provider during your annual well check will depend on your age, overall health, lifestyle risk factors, and family history of disease. Counseling  Your health care provider may ask you  questions about your: Alcohol use. Tobacco use. Drug use. Emotional well-being. Home and relationship well-being. Sexual activity. Eating habits. History of falls. Memory and ability to understand (cognition). Work and work Statistician. Screening  You may have the following tests or measurements: Height, weight, and BMI. Blood pressure. Lipid and cholesterol levels. These may be checked every 5 years, or more frequently if you are over 46 years old. Skin check. Lung cancer screening. You may have this screening every year starting at age 26 if you have a 30-pack-year history of smoking and currently smoke or have quit within the past 15 years. Fecal occult blood test (FOBT) of the stool. You may have this test every year starting at age 70. Flexible sigmoidoscopy or colonoscopy. You may have a sigmoidoscopy every 5 years or a colonoscopy every 10 years starting at age 74. Prostate cancer screening. Recommendations will vary depending on your family history and other risks. Hepatitis C blood test. Hepatitis B blood test. Sexually transmitted disease (STD) testing. Diabetes screening. This is done by checking your blood sugar (glucose) after you have not eaten for a while (fasting). You may have this done every 1-3 years. Abdominal aortic aneurysm (AAA) screening. You may need this if you are a current or former smoker. Osteoporosis. You may be screened starting at age 29 if you are at high risk. Talk with your health care provider about your test results, treatment options, and if necessary, the need for more tests. Vaccines  Your health care provider may recommend certain vaccines, such as: Influenza vaccine. This is recommended every year. Tetanus, diphtheria, and acellular pertussis (Tdap, Td) vaccine. You may need a Td booster every 10 years. Zoster vaccine. You may need this after age 46. Pneumococcal 13-valent conjugate (PCV13) vaccine. One dose is recommended after age  46. Pneumococcal polysaccharide (PPSV23) vaccine. One dose is recommended after age 21. Talk to your health care provider about which screenings and vaccines you need and how often you need them. This information is not intended to replace advice given to you by your health care provider. Make sure you discuss any questions you have with your health care provider. Document Released: 06/26/2015 Document Revised: 02/17/2016 Document Reviewed: 03/31/2015 Elsevier Interactive Patient Education  2017 [place] Prevention in the Home Falls can cause injuries. They can happen to people of all ages. There are many things you can do to make your home safe and to help prevent falls. What can I do on the outside of my home? Regularly fix the edges of walkways and  driveways and fix any cracks. Remove anything that might make you trip as you walk through a door, such as a raised step or threshold. Trim any bushes or trees on the path to your home. Use bright outdoor lighting. Clear any walking paths of anything that might make someone trip, such as rocks or tools. Regularly check to see if handrails are loose or broken. Make sure that both sides of any steps have handrails. Any raised decks and porches should have guardrails on the edges. Have any leaves, snow, or ice cleared regularly. Use sand or salt on walking paths during winter. Clean up any spills in your garage right away. This includes oil or grease spills. What can I do in the bathroom? Use night lights. Install grab bars by the toilet and in the tub and shower. Do not use towel bars as grab bars. Use non-skid mats or decals in the tub or shower. If you need to sit down in the shower, use a plastic, non-slip stool. Keep the floor dry. Clean up any water that spills on the floor as soon as it happens. Remove soap buildup in the tub or shower regularly. Attach bath mats securely with double-sided non-slip rug tape. Do not have throw  rugs and other things on the floor that can make you trip. What can I do in the bedroom? Use night lights. Make sure that you have a light by your bed that is easy to reach. Do not use any sheets or blankets that are too big for your bed. They should not hang down onto the floor. Have a firm chair that has side arms. You can use this for support while you get dressed. Do not have throw rugs and other things on the floor that can make you trip. What can I do in the kitchen? Clean up any spills right away. Avoid walking on wet floors. Keep items that you use a lot in easy-to-reach places. If you need to reach something above you, use a strong step stool that has a grab bar. Keep electrical cords out of the way. Do not use floor polish or wax that makes floors slippery. If you must use wax, use non-skid floor wax. Do not have throw rugs and other things on the floor that can make you trip. What can I do with my stairs? Do not leave any items on the stairs. Make sure that there are handrails on both sides of the stairs and use them. Fix handrails that are broken or loose. Make sure that handrails are as long as the stairways. Check any carpeting to make sure that it is firmly attached to the stairs. Fix any carpet that is loose or worn. Avoid having throw rugs at the top or bottom of the stairs. If you do have throw rugs, attach them to the floor with carpet tape. Make sure that you have a light switch at the top of the stairs and the bottom of the stairs. If you do not have them, ask someone to add them for you. What else can I do to help prevent falls? Wear shoes that: Do not have high heels. Have rubber bottoms. Are comfortable and fit you well. Are closed at the toe. Do not wear sandals. If you use a stepladder: Make sure that it is fully opened. Do not climb a closed stepladder. Make sure that both sides of the stepladder are locked into place. Ask someone to hold it for you, if  possible. Clearly  mark and make sure that you can see: Any grab bars or handrails. First and last steps. Where the edge of each step is. Use tools that help you move around (mobility aids) if they are needed. These include: Canes. Walkers. Scooters. Crutches. Turn on the lights when you go into a dark area. Replace any light bulbs as soon as they burn out. Set up your furniture so you have a clear path. Avoid moving your furniture around. If any of your floors are uneven, fix them. If there are any pets around you, be aware of where they are. Review your medicines with your doctor. Some medicines can make you feel dizzy. This can increase your chance of falling. Ask your doctor what other things that you can do to help prevent falls. This information is not intended to replace advice given to you by your health care provider. Make sure you discuss any questions you have with your health care provider. Document Released: 03/26/2009 Document Revised: 11/05/2015 Document Reviewed: 07/04/2014 Elsevier Interactive Patient Education  2017 [employer].

## 2022-05-31 ENCOUNTER — Ambulatory Visit: Payer: Medicare Other | Attending: Cardiovascular Disease | Admitting: Cardiovascular Disease

## 2022-05-31 ENCOUNTER — Encounter: Payer: Self-pay | Admitting: Cardiovascular Disease

## 2022-05-31 VITALS — BP 122/56 | HR 75 | Ht 73.0 in | Wt 222.0 lb

## 2022-05-31 DIAGNOSIS — I10 Essential (primary) hypertension: Secondary | ICD-10-CM | POA: Insufficient documentation

## 2022-05-31 DIAGNOSIS — E785 Hyperlipidemia, unspecified: Secondary | ICD-10-CM | POA: Insufficient documentation

## 2022-05-31 DIAGNOSIS — E669 Obesity, unspecified: Secondary | ICD-10-CM | POA: Diagnosis not present

## 2022-05-31 DIAGNOSIS — E1169 Type 2 diabetes mellitus with other specified complication: Secondary | ICD-10-CM | POA: Diagnosis not present

## 2022-05-31 DIAGNOSIS — I70299 Other atherosclerosis of native arteries of extremities, unspecified extremity: Secondary | ICD-10-CM

## 2022-05-31 DIAGNOSIS — N184 Chronic kidney disease, stage 4 (severe): Secondary | ICD-10-CM | POA: Insufficient documentation

## 2022-05-31 DIAGNOSIS — L97909 Non-pressure chronic ulcer of unspecified part of unspecified lower leg with unspecified severity: Secondary | ICD-10-CM | POA: Diagnosis not present

## 2022-05-31 DIAGNOSIS — I739 Peripheral vascular disease, unspecified: Secondary | ICD-10-CM | POA: Diagnosis not present

## 2022-05-31 DIAGNOSIS — G4733 Obstructive sleep apnea (adult) (pediatric): Secondary | ICD-10-CM | POA: Insufficient documentation

## 2022-05-31 NOTE — Patient Instructions (Signed)

## 2022-05-31 NOTE — Progress Notes (Signed)
Patient ID: Micheal Walls, male   DOB: 08/04/1935, 86 y.o.   MRN: [id number]    Cardiology Office Note    Date:  05/31/2022   ID:  Micheal Walls, DOB 21-Feb-1936, MRN [id number]  PCP:  [name], [name]  Cardiologist:   [name], MD   Chief complaint: PAD   History of Present Illness:  Micheal Walls is a 86 y.o. male with a history of previous stroke, right below the knee amputation for diabetes-related peripheral arterial disease, insulin-requiring type 2 diabetes mellitus, hyperlipidemia, hypertension and chronic kidney disease as well as obstructive sleep apnea on chronic CPAP.  Unfortunately, despite attempts at conservative management last June he ended up getting an amputation of the infected left second toe that had osteomyelitis.  He has not had any other serious health problems since then.  He is mostly sedentary, following right below the knee amputation many years earlier.  He does not have problems with shortness of breath or chest pain either at rest or with a light activity that he can perform.  He does not have orthopnea or PND.  He does have some residual edema of the left lower extremity, 2+, limited to the ankle.  Denies palpitations, dizziness, syncope.  Has not had any new focal neurological complaints.  He reports 100% compliant with CPAP.  Denies daytime somnolence.  Metabolic control is good with a hemoglobin A1c of 6.1% and LDL cholesterol 46.  Has a chronically low HDL at 29.  Renal function parameters have been largely stable with a recent creatinine of 2.1.  Electrolytes are normal (he sees Dr. Vanetta Mulders at [facility]).  Has not had a sleep clinic appt in over 3 years.  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  ABIs showed noncompressible left lower extremity arteries.  Abdominal aortogram with left lower extremity angiogram performed September 2022:  femoral and popliteal vessels are widely patent, the anterior tibial occludes in  the distal third of the calf and reconstitutes above the ankle, the tibioperoneal trunk is patent with the peroneal artery occluded above the ankle and proximal occlusion of the posterior tibial artery with reconstitution at the ankle.  He has never had a cardiac catheterization. By noninvasive imaging has no evidence of nuclear perfusion abnormalities and has a normal left ventricular systolic function. Heart failure has not been an issue to date. He has chronic problems with iron deficiency anemia and has previously undergone upper endoscopy, colonoscopy and capsule enteroscopy without a clear source of bleeding other than some degree of right-sided colonic diverticulosis.  His renal dysfunction and anemia have been a disincentive to perform any angiography based procedures.   Past Medical History:  Diagnosis Date   Acute metabolic encephalopathy 01/14/1516   Amputated below knee [facility])    right   Anemia    Aspiration pneumonia ([place]) 11/27/735   Diastolic heart failure (HCC)    Diverticulosis    DM (diabetes mellitus) ([place])    Gout    Hyperlipemia    OSA on CPAP    RBBB    Sepsis ([place]) 11/02/2019   Systemic hypertension     Past Surgical History:  Procedure Laterality Date   ABDOMINAL AORTOGRAM W/LOWER EXTREMITY Left 08/11/2020   Procedure: ABDOMINAL AORTOGRAM W/LOWER EXTREMITY;  Surgeon: Serafina Mitchell, MD;  Location: [place] [facility];  Service: Cardiovascular;  Laterality: Left;   ABDOMINAL AORTOGRAM W/LOWER EXTREMITY N/A 02/26/2021   Procedure: ABDOMINAL AORTOGRAM W/LOWER EXTREMITY;  Surgeon: Cherre Robins,  MD;  Location: [place] [facility];  Service: Cardiovascular;  Laterality: N/A;   AMPUTATION Left 09/14/2020   Procedure: AMPUTATION LEFT GREAT TOE;  Surgeon: [name], DPM;  Location: [place];  Service: Podiatry;  Laterality: Left;   AMPUTATION TOE Left 12/03/2021   Procedure: AMPUTATION TOE, LEFT second;  Surgeon: Lorenda Peck, DPM;  Location: [place];  Service: Podiatry;   Laterality: Left;   LEG AMPUTATION BELOW KNEE     right   NM MYOCAR PERF WALL MOTION  09/18/2009   no ischemia   PERIPHERAL VASCULAR BALLOON ANGIOPLASTY Left 08/11/2020   Procedure: PERIPHERAL VASCULAR BALLOON ANGIOPLASTY;  Surgeon: Serafina Mitchell, MD;  Location: [place] [facility];  Service: Cardiovascular;  Laterality: Left;  AT   PERIPHERAL VASCULAR INTERVENTION Left 02/26/2021   Procedure: PERIPHERAL VASCULAR INTERVENTION;  Surgeon: Cherre Robins, MD;  Location: [place] [facility];  Service: Cardiovascular;  Laterality: Left;    Current Medications: Outpatient Medications Prior to Visit  Medication Sig Dispense Refill   allopurinol (ZYLOPRIM) 100 MG tablet Take 100 mg by mouth 2 (two) times daily.     aspirin EC 81 MG tablet Take 81 mg by mouth daily. Swallow whole.     colchicine 0.6 MG tablet Take 0.6 mg by mouth once a week.     Continuous Blood Gluc Sensor (FREESTYLE LIBRE 14 DAY SENSOR) MISC Use as directed to check blood sugars 6 each 1   diltiazem (CARDIZEM CD) 240 MG 24 hr capsule TAKE ONE CAPSULE BY MOUTH ONCE DAILY 90 capsule 3   dorzolamide (TRUSOPT) 2 % ophthalmic solution Place 1 drop into both eyes 2 (two) times daily.     ferrous sulfate 325 (65 FE) MG tablet Take 1 tablet (325 mg total) by mouth 2 (two) times daily with a meal.  3   furosemide (LASIX) 40 MG tablet Take 1 tablet (40 mg total) by mouth 2 (two) times daily. 180 tablet 1   furosemide (LASIX) 40 MG tablet Take 1 tablet in the morning and one in the evening 180 tablet 1   gabapentin (NEURONTIN) 100 MG capsule TAKE TWO CAPSULES BY MOUTH EVERY MORNING and TAKE ONE CAPSULE BY MOUTH AT NOON and TAKE ONE CAPSULE BY MOUTH EVERY EVENING 360 capsule 1   gentamicin ointment (GARAMYCIN) 0.1 % Apply 1 Application topically 3 (three) times daily. 15 g 0   glucose blood (FREESTYLE PRECISION NEO TEST) test strip Use as instructed 100 each 12   ketotifen (ZADITOR) 0.025 % ophthalmic solution Place 1 drop into both eyes 2  (two) times daily.     latanoprost (XALATAN) 0.005 % ophthalmic solution Place 1 drop into both eyes at bedtime.      Multiple Vitamins-Minerals (MULTIVITAMIN WITH MINERALS) tablet Take 1 tablet by mouth daily.     Multiple Vitamins-Minerals (PRESERVISION AREDS) CAPS Take 2 capsules by mouth daily.     pantoprazole (PROTONIX) 40 MG tablet TAKE ONE TABLET BY MOUTH EVERYDAY AT BEDTIME 90 tablet 1   rosuvastatin (CRESTOR) 20 MG tablet Take 1 tablet (20 mg total) by mouth daily. 90 tablet 3   Semaglutide, [name], (OZEMPIC, [name],) 2 MG/1.5ML SOPN Inject 1 mg into the skin once a week. 9 mL 1   senna-docusate (SENOKOT-S) 8.6-50 MG tablet Take 1 tablet by mouth 2 (two) times daily between meals as needed for mild constipation or moderate constipation. (Patient taking differently: Take 1 tablet by mouth 2 (two) times daily as needed (constipation).) 60 tablet 0  SYNTHROID 25 MCG tablet Take 1 tablet by mouth Monday - Friday 60 tablet 2   tamsulosin (FLOMAX) 0.4 MG CAPS capsule Take 0.4 mg by mouth daily.     No facility-administered medications prior to visit.     Allergies:   Vancomycin   Social History   Socioeconomic History   Marital status: Married    Spouse name: Not on file   Number of children: Not on file   Years of education: Not on file   Highest education level: Not on file  Occupational History   Occupation: retired  Tobacco Use   Smoking status: Former    Types: Cigars    Quit date: 1997    Years since quitting: 26.9   Smokeless tobacco: Never  Vaping Use   Vaping Use: Former  Substance and Sexual Activity   Alcohol use: No    Alcohol/week: 0.0 standard drinks of alcohol   Drug use: No   Sexual activity: Not Currently  Other Topics Concern   Not on file  Social History Narrative   Not on file   Social Determinants of Health   Financial Resource Strain: [place]  (05/26/2022)   Overall Financial Resource Strain (CARDIA)    Difficulty of Paying Living  Expenses: Not hard at all  Food Insecurity: No Food Insecurity (05/26/2022)   Hunger Vital Sign    Worried About Running Out of Food in the Last Year: Never true    [place] in the Last Year: Never true  Transportation Needs: No Transportation Needs (05/26/2022)   PRAPARE - Hydrologist (Medical): No    Lack of Transportation (Non-Medical): No  Physical Activity: Inactive (05/26/2022)   Exercise Vital Sign    Days of Exercise per Week: 0 days    Minutes of Exercise per Session: 0 min  Stress: No Stress Concern Present (05/26/2022)   [place]    Feeling of Stress : Not at all  Social Connections: Not on file     Family History:  The patient's family history includes Cancer in his sister; Diabetes in his mother; Heart attack in his father.   ROS:   Please see the history of present illness.    Review of Systems  Constitutional: Positive for chills.    All other systems are reviewed and are negative.   PHYSICAL EXAM:   VS:  BP (!) 122/56 (BP Location: Left Arm, Patient Position: Sitting, Cuff Size: Large)   Pulse 75   Ht $R'6\' 1"'RP$  (1.854 m)   Wt 222 lb (100.7 kg)   SpO2 96%   BMI 29.29 kg/m     General: Alert, oriented x3, no distress, obese Head: no evidence of trauma, PERRL, EOMI, no exophtalmos or lid lag, no myxedema, no xanthelasma; normal ears, nose and oropharynx Neck: normal jugular venous pulsations and no hepatojugular reflux; brisk carotid pulses without delay and no carotid bruits Chest: clear to auscultation, no signs of consolidation by percussion or palpation, normal fremitus, symmetrical and full respiratory excursions Cardiovascular: normal position and quality of the apical impulse, regular rhythm, normal first and second heart sounds, no murmurs, rubs or gallops Abdomen: no tenderness or distention, no masses by palpation, no abnormal pulsatility or arterial  bruits, normal bowel sounds, no hepatosplenomegaly Extremities: Right below the knee amputation.  2 amputated toes on the left foot.  The foot is warm but pulses cannot be palpated.  He has 2+  soft pitting edema of the foot and ankle on the left side. Neurological: grossly nonfocal Psych: Normal mood and affect  Wt Readings from Last 3 Encounters:  05/31/22 222 lb (100.7 kg)  01/21/22 241 lb 6.4 oz (109.5 kg)  01/17/22 253 lb (114.8 kg)      Studies/Labs Reviewed:   EKG:  EKG is ordered today.  It is very similar to previous tracings and shows sinus rhythm with first-degree AV block (PR 260 ms) with a single PVC, old right bundle branch block, no ischemic repolarization abnormalities  Echocardiogram 07/2020: 1. Left ventricular ejection fraction, by estimation, is 55 to 60%. The  left ventricle has normal function. The left ventricle has no regional  wall motion abnormalities. There is moderate left ventricular hypertrophy.  Indeterminate diastolic filling due  to E-A fusion.   2. Right ventricular systolic function is normal. The right ventricular  size is normal.   3. The pericardial effusion is posterior to the left ventricle.   4. The mitral valve is abnormal. Trivial mitral valve regurgitation.   5. The aortic valve is tricuspid. Aortic valve regurgitation is not  visualized.   6. Aortic dilatation noted. There is mild dilatation of the aortic root,  measuring 42 mm.   7. The inferior vena cava is dilated in size with >50% respiratory  variability, suggesting right atrial pressure of 8 mmHg.   Conclusion(s)/Recommendation(s): No intracardiac source of embolism  detected on this transthoracic study. A transesophageal echocardiogram is  recommended to exclude cardiac source of embolism if clinically indicated.   Arterial duplex of the left lower extremity October 10, 2018:  Unable to obtain ABI (noncompressible vessels) Left: Patent common femoral, superficial femoral, profunda  femoral, and  popliteal arteries without evidence of stenosis.  Tibial vessels difficult to identify and evaluate due to edema, however,  numerous collateralls noted throughout the calf, suggesting tibial  occlusive disease.   Recent Labs: Lipid Panel     Component Value Date/Time   CHOL 96 (L) 11/17/2021 1040   TRIG 115 11/17/2021 1040   HDL 29 (L) 11/17/2021 1040   CHOLHDL 3.3 11/17/2021 1040   CHOLHDL 2.4 07/19/2020 0330   VLDL 9 07/19/2020 0330   LDLCALC 46 11/17/2021 1040   LABVLDL 21 11/17/2021 1040   BMET    Component Value Date/Time   NA 140 04/26/2022 0956   NA 142 03/21/2022 1432   K 3.9 04/26/2022 0956   CL 106 04/26/2022 0956   CO2 28 04/26/2022 0956   GLUCOSE 114 (H) 04/26/2022 0956   BUN 69 (H) 04/26/2022 0956   BUN 56 (H) 03/21/2022 1432   CREATININE 2.12 (H) 04/26/2022 0956   CREATININE 1.85 (H) 08/27/2020 1525   CALCIUM 10.1 04/26/2022 0956   GFRNONAA 30 (L) 04/26/2022 0956   GFRAA 42 (L) 05/20/2020 0951      ASSESSMENT:    1. PAD (peripheral artery disease) ([place])   2. Essential hypertension   3. OSA on CPAP   4. Diabetes mellitus type 2 in obese ([place])   5. CKD (chronic kidney disease) stage 4, GFR 15-29 ml/min (HCC)   6. Dyslipidemia (high LDL; low HDL)      PLAN:  In order of problems listed above:  PAD: Has had right below the knee amputation amputation of 2 toes on the left foot.  Recent angiogram shows that disease is primarily infrapopliteal and not amenable to revascularization.  Conservative management.  Strong suspicion that he has coronary artery disease, but he does not have  either angina or heart failure and his renal dysfunction is a major disincentive to evaluation with contrast based studies. HTN: Well-controlled on current medications. OSA: Reports compliance with CPAP but has not had a sleep clinic visit in over 3 years.  Will try to get that scheduled. DM: Excellent glycemic control.  Ozempic has helped him lose weight.   Diabetes complicated by retinopathy, nephropathy, neuropathy, PAD. CKD: Creatinine baseline seems to be around 2.1 and earlier this year was as high as 2.4.  Followed in [place] [facility] by Dr. Moshe Cipro. HLP: LDL is at target less than 70.  HDL is chronically low.  Triglycerides are normal.  Continue same medications.     Medication Adjustments/Labs and Tests Ordered: Current medicines are reviewed at length with the patient today.  Concerns regarding medicines are outlined above.  Medication changes, Labs and Tests ordered today are listed in the Patient Instructions below. Patient Instructions  Medication Instructions:  The current medical regimen is effective;  continue present plan and medications.  *If you need a refill on your cardiac medications before your next appointment, please call your pharmacy*   Follow-Up: At [facility], you and your health needs are our priority.  As part of our continuing mission to provide you with exceptional heart care, we have created designated Provider Care Teams.  These Care Teams include your primary Cardiologist (physician) and Advanced Practice Providers (APPs -  Physician Assistants and Nurse Practitioners) who all work together to provide you with the care you need, when you need it.  We recommend signing up for the patient portal called "MyChart".  Sign up information is provided on this After Visit Summary.  MyChart is used to connect with patients for Virtual Visits (Telemedicine).  Patients are able to view lab/test results, encounter notes, upcoming appointments, etc.  Non-urgent messages can be sent to your provider as well.   To learn more about what you can do with MyChart, go to [email or web address].    Your next appointment:   12 month(s)  The format for your next appointment:   In Person  Provider:   [name], MD            Signed, [name], MD  05/31/2022 8:37 AM    [address], [place], [place]  [address] Phone: [phone]; Fax: [phone]

## 2022-06-01 ENCOUNTER — Encounter: Payer: Self-pay | Admitting: Nurse Practitioner

## 2022-06-02 ENCOUNTER — Ambulatory Visit: Payer: Self-pay

## 2022-06-02 NOTE — Patient Outreach (Signed)
  Care Coordination   Follow Up Visit Note   06/02/2022 Name: Micheal Walls MRN: [id number] DOB: 10-Feb-1936  Micheal Walls is a 86 y.o. year old male who sees Micheal Walls, [place] for primary care. I spoke with daughter [name] by phone today.  What matters to the patients health and wellness today?  Patient is need of a replacement CPAP machine.     Goals Addressed             This Visit's Progress    CPAP replacement needed       Care Coordination Interventions: Received inbound call from daughter [name] Determined patient's CPAP is not working, daughter stated his CPAP machine is around 84 years old Determined daughter [name] contacted [place] to inquire about replacement CPAP, she was advised the PCP will need to send [employer] a new referral  Placed outbound call to [place], spoke with [place], CSR who confirmed a loaner is available and [place] will contact patient's daughter to coordinate Determined the patient will need to complete the designated form provided by [place] and the PCP will be notified by [employer] if something else is needed  Advised daughter [name] of next steps and to be expecting a call from [place] daughter [name] will contact this RN if no call is received by [place] by 12 noon tomorrow           SDOH assessments and interventions completed:  No     Care Coordination Interventions:  Yes, provided   Follow up plan: Follow up call scheduled for 06/21/22 $RemoveBe'@12'yRpYcEvIu$  PM    Encounter Outcome:  Pt. Visit Completed

## 2022-06-02 NOTE — Patient Instructions (Signed)
Visit Information  Thank you for taking time to visit with me today. Please don't hesitate to contact me if I can be of assistance to you.   Following are the goals we discussed today:   Goals Addressed             This Visit's Progress    CPAP replacement needed       Care Coordination Interventions: Received inbound call from daughter [name] Determined patient's CPAP is not working, daughter stated his CPAP machine is around 84 years old Determined daughter [name] contacted [place] to inquire about replacement CPAP, she was advised the PCP will need to send [employer] a new referral  Placed outbound call to [place], spoke with Dexter, CSR who confirmed a loaner is available and [place] will contact patient's daughter to coordinate Determined the patient will need to complete the designated form provided by [place] and the PCP will be notified by [employer] if something else is needed  Advised daughter [name] of next steps and to be expecting a call from [place] daughter [name] will contact this RN if no call is received by [place] by 12 noon tomorrow           Our next appointment is by telephone on 06/21/22 at 12 PM  Please call the care guide team at [phone] if you need to cancel or reschedule your appointment.   If you are experiencing a Mental Health or [place] or need someone to talk to, please call 1-800-273-TALK (toll free, 24 hour hotline)  Patient verbalizes understanding of instructions and care plan provided today and agrees to view in [place]. Active MyChart status and patient understanding of how to access instructions and care plan via MyChart confirmed with patient.     Barb Merino, RN, BSN, CCM Care Management Coordinator [facility] Care Management Direct Phone: [phone]

## 2022-06-07 ENCOUNTER — Ambulatory Visit: Payer: Self-pay

## 2022-06-07 NOTE — Patient Outreach (Signed)
  Care Coordination   Follow Up Visit Note   06/07/2022 Name: NAOKI MIGLIACCIO MRN: [id number] DOB: [birth year]  AZARI HASLER is a 86 y.o. year old male who sees [name], [place] for primary care. I contacted [place] today by telephone for care coordination of a loaner CPAP machine.   What matters to the patients health and wellness today?  Patient needs a replacement CPAP machine, he will need a loaner CPAP as soon as possible.     Goals Addressed             This Visit's Progress    CPAP replacement needed       Care Coordination Interventions: Received voice message from daughter [name] advising she did not receive a call from [place] regarding her dad's loaner CPAP machine Placed outbound call to [place], spoke with Pieter Partridge CSR who advised a member from the CPAP team is scheduled to contact daughter [name] today by phone to discuss coordination of the loaner CPAP Placed unsuccessful call to daughter [name], left a HIPAA complaint voice message requesting a return call            SDOH assessments and interventions completed:  No     Care Coordination Interventions:  Yes, provided   Follow up plan: Follow up call scheduled for 06/21/22 $RemoveBe'@12'IlOxYFHqB$  PM    Encounter Outcome:  Pt. Visit Completed

## 2022-06-21 ENCOUNTER — Ambulatory Visit: Payer: Self-pay

## 2022-06-21 NOTE — Patient Outreach (Signed)
  Care Coordination   Follow Up Visit Note   06/21/2022 Name: NIRAV SWEDA MRN: [id number] DOB: 05/01/1936  KEAGAN BRISLIN is a 86 y.o. year old male who sees [name], [place] for primary care. I  collaborated with [employer] today by telephone to follow up on patient's replacement CPAP machine.   What matters to the patients health and wellness today?  na    Goals Addressed             This Visit's Progress    CPAP replacement needed       Care Coordination Interventions: Placed unsuccessful call to patient and daughter [name] to get status on replacement CPAP machine Spoke with [employer], confirmed patient received a loaner CPAP, however the order from PCP for replacement CPAP was not received Collaborated with PCP [name] [name] requesting she resend the order via parachute and fax [phone] Discussed with rep Levada Dy from the intake department the turnaround time for the replacement may be approximately 2 weeks          SDOH assessments and interventions completed:  No     Care Coordination Interventions:  Yes, provided   Follow up plan: Follow up call scheduled for 07/05/22 $RemoveBef'@11'cvntCvxjZX$ :00 AM    Encounter Outcome:  Pt. Visit Completed

## 2022-06-28 ENCOUNTER — Ambulatory Visit: Payer: Medicare Other | Admitting: Podiatry

## 2022-06-28 DIAGNOSIS — H401132 Primary open-angle glaucoma, bilateral, moderate stage: Secondary | ICD-10-CM | POA: Diagnosis not present

## 2022-06-28 LAB — HM DIABETES EYE EXAM

## 2022-06-30 ENCOUNTER — Ambulatory Visit (INDEPENDENT_AMBULATORY_CARE_PROVIDER_SITE_OTHER): Payer: Medicare Other | Admitting: Podiatry

## 2022-06-30 DIAGNOSIS — R609 Edema, unspecified: Secondary | ICD-10-CM

## 2022-06-30 DIAGNOSIS — B351 Tinea unguium: Secondary | ICD-10-CM | POA: Diagnosis not present

## 2022-06-30 DIAGNOSIS — I739 Peripheral vascular disease, unspecified: Secondary | ICD-10-CM | POA: Diagnosis not present

## 2022-06-30 DIAGNOSIS — E1151 Type 2 diabetes mellitus with diabetic peripheral angiopathy without gangrene: Secondary | ICD-10-CM

## 2022-06-30 DIAGNOSIS — R600 Localized edema: Secondary | ICD-10-CM

## 2022-07-02 ENCOUNTER — Other Ambulatory Visit: Payer: Self-pay | Admitting: Cardiovascular Disease

## 2022-07-04 NOTE — Progress Notes (Signed)
  Subjective:  Patient ID: IHAN PAT, male    DOB: 20-May-1936,  MRN: [id number]  Chief Complaint  Patient presents with   Nail Problem    [facility] [id number]      87 y.o. male returns for follow up on left foot ulcers, he says they are doing well and remain healed.  Nails are thick and elongated causing discomfort.  He has more swelling today in his leg  Review of Systems: Negative except as noted in the HPI. Denies N/V/F/Ch.   Objective:  There were no vitals filed for this visit. There is no height or weight on file to calculate BMI. Constitutional Well developed. Well nourished.  Vascular Foot warm and well perfused. Capillary refill normal to all digits.  Calf is soft and supple, no posterior calf or knee pain, negative Homans' sign.  No evidence of DVT.  He has +2 pitting edema on the side distal to the knee  Neurologic Normal speech. Oriented to person, place, and time. Epicritic sensation to light touch grossly absent bilaterally.  Dermatologic Wounds have healed with overlying hyperkeratosis, no recurrence.  Nails thickened elongated and crumbly with yellow discoloration and subungual debris on left foot.  Orthopedic: He has no tenderness to palpation noted about the surgical site.   Pathology results showed osteomyelitis with no residual infection noted at margin      Assessment:   1. Peripheral edema   2. Onychomycosis   3. PAD (peripheral artery disease) ([place])   4. Type II diabetes mellitus with peripheral circulatory disorder [facility])       Plan:  Patient was evaluated and treated and all questions answered.  Discussed the etiology and treatment options for the condition in detail with the patient. Educated patient on the topical and oral treatment options for mycotic nails. Recommended debridement of the nails today. Sharp and mechanical debridement performed of all painful and mycotic nails today. Nails debrided in length and thickness using a nail nipper to  level of comfort. Discussed treatment options including appropriate shoe gear. Follow up as needed for painful nails.  Regarding his peripheral edema we discussed use of elevation and compression stockings which she has at home.  We also discussed possibility of applying a multilayer compression dressing with an Haematologist today which was completed.  He has help at home that we will be able to remove this on Saturday, I discussed with him I do not want this on for more than 48 hours.  Unna boot was applied with stockinette, cast padding, Unna's boot, cast padding and Ace wrap in layers.

## 2022-07-05 ENCOUNTER — Ambulatory Visit: Payer: Self-pay

## 2022-07-05 DIAGNOSIS — M109 Gout, unspecified: Secondary | ICD-10-CM | POA: Diagnosis not present

## 2022-07-05 DIAGNOSIS — E118 Type 2 diabetes mellitus with unspecified complications: Secondary | ICD-10-CM | POA: Diagnosis not present

## 2022-07-05 DIAGNOSIS — N189 Chronic kidney disease, unspecified: Secondary | ICD-10-CM | POA: Diagnosis not present

## 2022-07-05 DIAGNOSIS — I129 Hypertensive chronic kidney disease with stage 1 through stage 4 chronic kidney disease, or unspecified chronic kidney disease: Secondary | ICD-10-CM | POA: Diagnosis not present

## 2022-07-05 DIAGNOSIS — E785 Hyperlipidemia, unspecified: Secondary | ICD-10-CM | POA: Diagnosis not present

## 2022-07-05 DIAGNOSIS — N3289 Other specified disorders of bladder: Secondary | ICD-10-CM | POA: Diagnosis not present

## 2022-07-05 DIAGNOSIS — Z6841 Body Mass Index (BMI) 40.0 and over, adult: Secondary | ICD-10-CM | POA: Diagnosis not present

## 2022-07-05 DIAGNOSIS — N183 Chronic kidney disease, stage 3 unspecified: Secondary | ICD-10-CM | POA: Diagnosis not present

## 2022-07-05 NOTE — Patient Outreach (Signed)
  Care Coordination   07/05/2022 Name: Micheal Walls MRN: [id number] DOB: 1935-08-09   Care Coordination Outreach Attempts:  An unsuccessful telephone outreach was attempted for a scheduled appointment today.  Follow Up Plan:  Additional outreach attempts will be made to offer the patient care coordination information and services.   Encounter Outcome:  No Answer   Care Coordination Interventions:  No, not indicated    Barb Merino, RN, BSN, CCM Care Management Coordinator [facility] Care Management  Direct Phone: [phone]

## 2022-07-06 ENCOUNTER — Telehealth: Payer: Self-pay

## 2022-07-06 DIAGNOSIS — Z23 Encounter for immunization: Secondary | ICD-10-CM | POA: Diagnosis not present

## 2022-07-06 NOTE — Progress Notes (Signed)
Care Management & Coordination Services Pharmacy Team  Reason for Encounter: Medication coordination and delivery  Contacted patient to discuss medications and coordinate delivery from [facility]. Spoke with patient on 07/06/2022   Cycle dispensing form sent to [name] for review.   Last adherence delivery date: 04-18-2022  Patient is due for next adherence delivery on: 07-18-2022  Spoke with patient on 07-06-2022 reviewed medications and coordinated delivery.  This delivery to include: Adherence Packaging  90 Days  Allopurinol 100 mg- 2 tablets daily (breakfast) Flomax 0.4 mg- 1 tablet daily (breakfast)   Diltiazem CD 240 mg- 1 tablet daily (breakfast) Pantoprazole 40 mg- 1 tablet daily (bedtime) Colchicine 0.6 mg- 1 tablet once weekly on Sunday (breakfast) Gabapentin 100 mg- 2 tablets at breakfast,  1 tablet at lunch, 1 tablet with evening meal Furosemide 40 mg- 1 tablet with breakfast and 1 tablet with evening meal Rosuvastatin 10 mg- 1 tablet daily (bedtime) Synthroid 25 mcg- 1 tablet at breakfast Monday-Friday   Patient declined the following medications this month: Ozempic- Gets from [place] from [place] [facility] Dorzolamide- Gets from [place] Ferrous sulfate- OTC Zaditor- No longer taking Freestyle libre- Gets from [employer] requested from providers include: Colchicine- sent to specialist by [place]  Confirmed delivery date of 07-18-2022, advised patient that pharmacy will contact them the morning of delivery.   Any concerns about your medications? No  How often do you forget or accidentally miss a dose? Never  Do you use a pillbox? No  Is patient in packaging Yes  If yes  What is the date on your next pill pack? 07-07-2022  Any concerns or issues with your packaging? No   Recent blood pressure readings are as follows:  122/56 05-31-2022  Recent blood glucose readings are as follows: 6.1 03-21-2022   Chart review: Recent office visits:  06-21-2022 Lynne Logan, RN (CCM)  06-07-2022 Lynne Logan, RN (CCM)  06-02-2022 Lynne Logan, RN (CCM)  05-26-2022 Kellie Simmering, LPN.  Medicare wellness visit  Recent consult visits:  06-30-2022 [name], DPM (Podiatry). Follow up on left foot ulcers.  05-31-2022 [name], [name], MD (Cardiology). EKG completed.  04-26-2022 Orson Slick, MD (Oncology). follow up for monoclonal gammopathy.   04-19-2022 [name], DPM (Podiatry)  Hospital visits:  None in previous 6 months  Medications: Outpatient Encounter Medications as of 07/06/2022  Medication Sig   allopurinol (ZYLOPRIM) 100 MG tablet Take 100 mg by mouth 2 (two) times daily.   aspirin EC 81 MG tablet Take 81 mg by mouth daily. Swallow whole.   colchicine 0.6 MG tablet Take 0.6 mg by mouth once a week.   Continuous Blood Gluc Sensor (FREESTYLE LIBRE 14 DAY SENSOR) MISC Use as directed to check blood sugars   diltiazem (CARDIZEM CD) 240 MG 24 hr capsule TAKE ONE CAPSULE BY MOUTH ONCE DAILY   dorzolamide (TRUSOPT) 2 % ophthalmic solution Place 1 drop into both eyes 2 (two) times daily.   ferrous sulfate 325 (65 FE) MG tablet Take 1 tablet (325 mg total) by mouth 2 (two) times daily with a meal.   furosemide (LASIX) 40 MG tablet Take 1 tablet (40 mg total) by mouth 2 (two) times daily.   furosemide (LASIX) 40 MG tablet Take 1 tablet in the morning and one in the evening   gabapentin (NEURONTIN) 100 MG capsule TAKE TWO CAPSULES BY MOUTH EVERY  MORNING and TAKE ONE CAPSULE BY MOUTH AT NOON and TAKE ONE CAPSULE BY MOUTH EVERY EVENING   gentamicin ointment (GARAMYCIN) 0.1 % Apply 1 Application topically 3 (three) times daily.   glucose blood (FREESTYLE PRECISION NEO TEST) test strip Use as instructed   ketotifen (ZADITOR) 0.025 % ophthalmic solution Place 1 drop into both eyes 2  (two) times daily.   latanoprost (XALATAN) 0.005 % ophthalmic solution Place 1 drop into both eyes at bedtime.    Multiple Vitamins-Minerals (MULTIVITAMIN WITH MINERALS) tablet Take 1 tablet by mouth daily.   Multiple Vitamins-Minerals (PRESERVISION AREDS) CAPS Take 2 capsules by mouth daily.   pantoprazole (PROTONIX) 40 MG tablet TAKE ONE TABLET BY MOUTH EVERYDAY AT BEDTIME   rosuvastatin (CRESTOR) 20 MG tablet TAKE ONE TABLET BY MOUTH EVERYDAY AT BEDTIME   Semaglutide, [name], (OZEMPIC, [name],) 2 MG/1.5ML SOPN Inject 1 mg into the skin once a week.   senna-docusate (SENOKOT-S) 8.6-50 MG tablet Take 1 tablet by mouth 2 (two) times daily between meals as needed for mild constipation or moderate constipation. (Patient taking differently: Take 1 tablet by mouth 2 (two) times daily as needed (constipation).)   SYNTHROID 25 MCG tablet Take 1 tablet by mouth Monday - Friday   tamsulosin (FLOMAX) 0.4 MG CAPS capsule Take 0.4 mg by mouth daily.   No facility-administered encounter medications on file as of 07/06/2022.   BP Readings from Last 3 Encounters:  05/31/22 (!) 122/56  05/26/22 130/60  04/26/22 (!) 132/59    Pulse Readings from Last 3 Encounters:  05/31/22 75  05/26/22 (!) 57  04/26/22 76    Lab Results  Component Value Date/Time   HGBA1C 6.1 (H) 03/21/2022 02:32 PM   HGBA1C 5.9 (H) 11/17/2021 10:40 AM   Lab Results  Component Value Date   CREATININE 2.12 (H) 04/26/2022   BUN 69 (H) 04/26/2022   GFRNONAA 30 (L) 04/26/2022   GFRAA 42 (L) 05/20/2020   NA 140 04/26/2022   K 3.9 04/26/2022   CALCIUM 10.1 04/26/2022   CO2 28 04/26/2022     [place] Clinical Pharmacist Assistant [phone]

## 2022-07-08 ENCOUNTER — Encounter: Payer: Self-pay | Admitting: Nurse Practitioner

## 2022-07-14 ENCOUNTER — Telehealth: Payer: Self-pay | Admitting: *Deleted

## 2022-07-14 NOTE — Progress Notes (Signed)
  Care Coordination Note  07/14/2022 Name: Micheal Walls MRN: [id number] DOB: 12-22-1935  Micheal Walls is a 87 y.o. year old male who is a primary care patient of [name], [place] and is actively engaged with the care management team. I reached out to [name] by phone today to assist with re-scheduling a follow up visit with the RN Case Manager  Follow up plan: Unsuccessful telephone outreach attempt made. A HIPAA compliant phone message was left for the patient providing contact information and requesting a return call.   [place]  Direct Dial: [phone]

## 2022-07-21 ENCOUNTER — Ambulatory Visit (INDEPENDENT_AMBULATORY_CARE_PROVIDER_SITE_OTHER): Payer: Medicare Other | Admitting: Podiatry

## 2022-07-21 DIAGNOSIS — I739 Peripheral vascular disease, unspecified: Secondary | ICD-10-CM | POA: Diagnosis not present

## 2022-07-21 DIAGNOSIS — L97522 Non-pressure chronic ulcer of other part of left foot with fat layer exposed: Secondary | ICD-10-CM

## 2022-07-21 MED ORDER — AMOXICILLIN-POT CLAVULANATE 875-125 MG PO TABS: 1.0000 | ORAL_TABLET | Freq: Two times a day (BID) | ORAL | 0 refills | Status: DC

## 2022-07-22 NOTE — Progress Notes (Signed)
  Care Coordination Note  07/22/2022 Name: Micheal Walls MRN: [id number] DOB: 1935/07/06  Micheal Walls is a 87 y.o. year old male who is a primary care patient of [name], [place] and is actively engaged with the care management team. I reached out to [name] by phone today to assist with re-scheduling a follow up visit with the RN Case Manager  Follow up plan: Unsuccessful telephone outreach attempt made. A HIPAA compliant phone message was left for the patient providing contact information and requesting a return call.   [place]  Direct Dial: [phone]

## 2022-07-24 NOTE — Progress Notes (Signed)
  Subjective:  Patient ID: Micheal Walls, male    DOB: September 14, 1935,  MRN: [id number]  Chief Complaint  Patient presents with   Foot Ulcer    3 week follow up left foot    87 y.o. male presents with the above complaint. History confirmed with patient.  He returns for a new issue of an ulcer on the third toe, said it has been there for a day or 2 not sure how it started  Objective:  Physical Exam: Foot remains warm and fairly well-perfused, he has weakly palpable DP pulse nonpalpable PT pulse, maceration and blistering around the nail of the third toe, nail is loose.  No purulence no exposed bone, granular ulceration beginning proximal nail fold, previous ulcers remain well-healed     Assessment:   1. PAD (peripheral artery disease) ([place])   2. Ulcer of left foot with fat layer exposed ([place])      Plan:  Patient was evaluated and treated and all questions answered.  Unfortunate has new ulceration surrounding the nailbed and blistering.  Rx for Augmentin was sent to pharmacy I recommended new noninvasive vascular testing to reevaluate his circulation.  Nail plate was removed uneventfully, he will dress daily with gentamicin ointment at home which she has.  Return to surgical shoe and utilize gauze I think the adhesive bandages are causing too much maceration.  I will see him back in 3 weeks to reevaluate.  Return in about 3 weeks (around 08/11/2022) for wound care.

## 2022-07-25 ENCOUNTER — Ambulatory Visit (INDEPENDENT_AMBULATORY_CARE_PROVIDER_SITE_OTHER): Payer: Medicare Other | Admitting: Nurse Practitioner

## 2022-07-25 ENCOUNTER — Encounter: Payer: Self-pay | Admitting: Nurse Practitioner

## 2022-07-25 VITALS — BP 142/80 | HR 78 | Temp 98.7°F | Ht 73.0 in | Wt 222.0 lb

## 2022-07-25 DIAGNOSIS — I130 Hypertensive heart and chronic kidney disease with heart failure and stage 1 through stage 4 chronic kidney disease, or unspecified chronic kidney disease: Secondary | ICD-10-CM

## 2022-07-25 DIAGNOSIS — E11621 Type 2 diabetes mellitus with foot ulcer: Secondary | ICD-10-CM | POA: Diagnosis not present

## 2022-07-25 DIAGNOSIS — Z89511 Acquired absence of right leg below knee: Secondary | ICD-10-CM

## 2022-07-25 DIAGNOSIS — N184 Chronic kidney disease, stage 4 (severe): Secondary | ICD-10-CM

## 2022-07-25 DIAGNOSIS — E1159 Type 2 diabetes mellitus with other circulatory complications: Secondary | ICD-10-CM | POA: Diagnosis not present

## 2022-07-25 DIAGNOSIS — Z89422 Acquired absence of other left toe(s): Secondary | ICD-10-CM

## 2022-07-25 DIAGNOSIS — L97524 Non-pressure chronic ulcer of other part of left foot with necrosis of bone: Secondary | ICD-10-CM | POA: Insufficient documentation

## 2022-07-25 DIAGNOSIS — M86672 Other chronic osteomyelitis, left ankle and foot: Secondary | ICD-10-CM

## 2022-07-25 DIAGNOSIS — E039 Hypothyroidism, unspecified: Secondary | ICD-10-CM

## 2022-07-25 DIAGNOSIS — L97509 Non-pressure chronic ulcer of other part of unspecified foot with unspecified severity: Secondary | ICD-10-CM

## 2022-07-25 DIAGNOSIS — E113592 Type 2 diabetes mellitus with proliferative diabetic retinopathy without macular edema, left eye: Secondary | ICD-10-CM

## 2022-07-25 DIAGNOSIS — E1122 Type 2 diabetes mellitus with diabetic chronic kidney disease: Secondary | ICD-10-CM

## 2022-07-25 DIAGNOSIS — Z794 Long term (current) use of insulin: Secondary | ICD-10-CM | POA: Diagnosis not present

## 2022-07-25 DIAGNOSIS — E782 Mixed hyperlipidemia: Secondary | ICD-10-CM

## 2022-07-25 DIAGNOSIS — G4733 Obstructive sleep apnea (adult) (pediatric): Secondary | ICD-10-CM | POA: Diagnosis not present

## 2022-07-25 DIAGNOSIS — I5032 Chronic diastolic (congestive) heart failure: Secondary | ICD-10-CM | POA: Diagnosis not present

## 2022-07-25 NOTE — Progress Notes (Addendum)
Hershal Coria Martin,acting as a Neurosurgeon for [name], [name].,have documented all relevant documentation on the behalf of [name], [name],as directed by  [name], [name] while in the presence of [name], [name].    Subjective:     Patient ID: Micheal Walls , male    DOB: 04/27/1936 , 87 y.o.   MRN: [id number]   Chief Complaint  Patient presents with   Diabetes    HPI  Patient here for a f/u on his diabetes, chol, and thyroid check. Patient states compliance with medications, patient doesn't have any other concerns today.   BP Readings from Last 3 Encounters: 07/25/22 : (!) 142/80 05/31/22 : (!) 122/56 05/26/22 : 130/60    Diabetes He presents for his follow-up diabetic visit. He has type 2 diabetes mellitus. His disease course has been improving. Pertinent negatives for hypoglycemia include no confusion, dizziness, headaches or nervousness/anxiousness. There are no diabetic associated symptoms. Pertinent negatives for diabetes include no fatigue, no polydipsia, no polyphagia and no polyuria. There are no hypoglycemic complications. Symptoms are improving. Diabetic complications include peripheral neuropathy and retinopathy. Pertinent negatives for diabetic complications include no autonomic neuropathy or PVD. Risk factors for coronary artery disease include sedentary lifestyle and obesity. Current diabetic treatment includes oral agent (monotherapy) (no longer on Tresiba since December). He is compliant with treatment all of the time. His weight is stable. He is following a diabetic diet. When asked about meal planning, he reported none. He has not had a previous visit with a dietitian. He rarely participates in exercise. His home blood glucose trend is decreasing steadily. (Average 115 over last 90 days.) An ACE inhibitor/angiotensin II receptor blocker is being taken. He sees a podiatrist (Dr Donzetta Matters - continues to be followed for his left toe amputation 2nd toe).Eye exam is  current (Dr. Luciana Axe 06/24/2021).     Past Medical History:  Diagnosis Date   Acute metabolic encephalopathy 01/16/2020   Amputated below knee [facility])    right   Anemia    Aspiration pneumonia (HCC) 11/02/2019   Diastolic heart failure (HCC)    Diverticulosis    DM (diabetes mellitus) (HCC)    Gout    Hyperlipemia    OSA on CPAP    RBBB    Sepsis (HCC) 11/02/2019   Systemic hypertension      Family History  Problem Relation Age of Onset   Diabetes Mother    Heart attack Father    Cancer Sister      Current Outpatient Medications:    allopurinol (ZYLOPRIM) 100 MG tablet, Take 100 mg by mouth 2 (two) times daily., Disp: , Rfl:    aspirin EC 81 MG tablet, Take 81 mg by mouth daily. Swallow whole., Disp: , Rfl:    colchicine 0.6 MG tablet, Take 0.6 mg by mouth once a week., Disp: , Rfl:    diltiazem (CARDIZEM CD) 240 MG 24 hr capsule, TAKE ONE CAPSULE BY MOUTH ONCE DAILY, Disp: 90 capsule, Rfl: 3   dorzolamide (TRUSOPT) 2 % ophthalmic solution, Place 1 drop into both eyes 2 (two) times daily., Disp: , Rfl:    ferrous sulfate 325 (65 FE) MG tablet, Take 1 tablet (325 mg total) by mouth 2 (two) times daily with a meal., Disp: , Rfl: 3   furosemide (LASIX) 40 MG tablet, Take 1 tablet (40 mg total) by mouth 2 (two) times daily., Disp: 180 tablet, Rfl: 1   gabapentin (NEURONTIN) 100 MG capsule, TAKE TWO CAPSULES BY MOUTH EVERY MORNING  and TAKE ONE CAPSULE BY MOUTH AT NOON and TAKE ONE CAPSULE BY MOUTH EVERY EVENING, Disp: 360 capsule, Rfl: 1   glucose blood (FREESTYLE PRECISION NEO TEST) test strip, Use as instructed, Disp: 100 each, Rfl: 12   ketotifen (ZADITOR) 0.025 % ophthalmic solution, Place 1 drop into both eyes 2 (two) times daily., Disp: , Rfl:    latanoprost (XALATAN) 0.005 % ophthalmic solution, Place 1 drop into both eyes at bedtime. , Disp: , Rfl:    Multiple Vitamins-Minerals (MULTIVITAMIN WITH MINERALS) tablet, Take 1 tablet by mouth daily., Disp: , Rfl:    Multiple  Vitamins-Minerals (PRESERVISION AREDS) CAPS, Take 2 capsules by mouth daily., Disp: , Rfl:    pantoprazole (PROTONIX) 40 MG tablet, TAKE ONE TABLET BY MOUTH EVERYDAY AT BEDTIME, Disp: 90 tablet, Rfl: 1   rosuvastatin (CRESTOR) 20 MG tablet, TAKE ONE TABLET BY MOUTH EVERYDAY AT BEDTIME, Disp: 90 tablet, Rfl: 3   senna-docusate (SENOKOT-S) 8.6-50 MG tablet, Take 1 tablet by mouth 2 (two) times daily between meals as needed for mild constipation or moderate constipation. (Patient taking differently: Take 1 tablet by mouth 2 (two) times daily as needed (constipation).), Disp: 60 tablet, Rfl: 0   SYNTHROID 25 MCG tablet, Take 1 tablet by mouth Monday - Friday, Disp: 60 tablet, Rfl: 2   tamsulosin (FLOMAX) 0.4 MG CAPS capsule, Take 0.4 mg by mouth daily., Disp: , Rfl:    amoxicillin-clavulanate (AUGMENTIN) 875-125 MG tablet, Take 1 tablet by mouth 2 (two) times daily., Disp: 20 tablet, Rfl: 0   Castellani Paint 1.5 % LIQD, Apply between toes daily, Disp: 30 mL, Rfl: 0   Continuous Blood Gluc Sensor (FREESTYLE LIBRE 14 DAY SENSOR) MISC, Use as directed to check blood sugars, Disp: 6 each, Rfl: 1   gentamicin ointment (GARAMYCIN) 0.1 %, Apply 1 Application topically 3 (three) times daily., Disp: 15 g, Rfl: 0   Semaglutide, [name], (OZEMPIC, [name],) 2 MG/1.5ML SOPN, Inject 1 mg into the skin once a week., Disp: 9 mL, Rfl: 1   Allergies  Allergen Reactions   Vancomycin Rash     Review of Systems  Constitutional:  Negative for fatigue.  Respiratory: Negative.    Cardiovascular: Negative.   Endocrine: Negative for polydipsia, polyphagia and polyuria.  Neurological:  Negative for dizziness and headaches.  Psychiatric/Behavioral: Negative.  Negative for confusion. The patient is not nervous/anxious.      Today's Vitals   07/25/22 1043  BP: (!) 142/80  Pulse: 78  Temp: 98.7 F (37.1 C)  TempSrc: Oral  Weight: 222 lb (100.7 kg)  Height: 6\' 1"  (1.854 m)  PainSc: 0-No pain   Body mass index  is 29.29 kg/m.  Wt Readings from Last 3 Encounters:  07/25/22 222 lb (100.7 kg)  05/31/22 222 lb (100.7 kg)  01/21/22 241 lb 6.4 oz (109.5 kg)    Objective:  Physical Exam Vitals reviewed.  Constitutional:      General: He is not in acute distress.    Appearance: Normal appearance. He is obese.  Cardiovascular:     Rate and Rhythm: Normal rate and regular rhythm.     Pulses: Normal pulses.     Heart sounds: Normal heart sounds. No murmur heard. Pulmonary:     Effort: Pulmonary effort is normal. No respiratory distress.     Breath sounds: Normal breath sounds. No wheezing.  Musculoskeletal:     Comments: Left lower extremity is wrapped in ace bandage and dressing to foot ulcer  Skin:  General: Skin is warm and dry.     Capillary Refill: Capillary refill takes less than 2 seconds.     Comments: Continues to wear a dressing to his left foot that is being managed by his Podiatrist  Neurological:     General: No focal deficit present.     Mental Status: He is alert and oriented to person, place, and time.     Cranial Nerves: No cranial nerve deficit.     Motor: No weakness.  Psychiatric:        Mood and Affect: Mood normal.        Behavior: Behavior normal.        Thought Content: Thought content normal.        Judgment: Judgment normal.         Assessment And Plan:     1. Type 2 diabetes mellitus with foot ulcer, with long-term current use of insulin (HCC) Comments: HgbA1c is stable. Continue current medications - Hemoglobin A1c - BMP8+eGFR  2. 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 Encouraged to make sure he is taking his medications as directed.  3. Mixed hyperlipidemia Comments: Cholesterol levels are stable. Continue current medications. - Lipid panel  4. Acquired hypothyroidism Comments: Thyroid levels are stable. Continue current medications - TSH + free T4  5. 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 Acquired absence of right leg below knee (HCC)  7. Status post amputation of lesser toe of left foot (HCC)  8. Other chronic osteomyelitis of left foot (HCC) Comments: Continue current treatment with Podiatry  9. 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 Continue medications  10. CKD (chronic kidney disease) stage 4, GFR 15-29 ml/min (HCC) Comments: Continue f/u with Nephrology  11. OSA on CPAP Comments: Reports he has still not received his new CPAP, his forms were completed in January 2024, will f/u  12. Chronic diastolic heart failure (HCC) Comments: continue f/u with Cardiology, no recent issues  13. Type 2 diabetes mellitus with stage 4 chronic kidney disease, without long-term current use of insulin (HCC)     Patient was given opportunity to ask questions. Patient verbalized understanding of the plan and was able to repeat key elements of the plan. All questions were answered to their satisfaction.  [name], [name]   I, [name], [name], have reviewed all documentation for this visit. The documentation on 07/25/22 for the exam, diagnosis, procedures, and orders are all accurate and complete.   IF YOU HAVE BEEN REFERRED TO A SPECIALIST, IT MAY TAKE 1-2 WEEKS TO SCHEDULE/PROCESS THE REFERRAL. IF YOU HAVE NOT HEARD FROM US/SPECIALIST IN TWO WEEKS, PLEASE GIVE [place] A CALL AT [phone] X [phone].   THE PATIENT IS ENCOURAGED TO PRACTICE SOCIAL DISTANCING DUE TO THE COVID-19 PANDEMIC.

## 2022-07-25 NOTE — Patient Instructions (Signed)

## 2022-07-26 LAB — LIPID PANEL
Chol/HDL Ratio: 2.8 ratio (ref 0.0–5.0)
Cholesterol, Total: 95 mg/dL — ABNORMAL LOW (ref 100–199)
HDL: 34 mg/dL — ABNORMAL LOW (ref >39)
LDL Chol Calc (NIH): 49 mg/dL (ref 0–99)
Triglycerides: 47 mg/dL (ref 0–149)
VLDL Cholesterol Cal: 12 mg/dL (ref 5–40)

## 2022-07-26 LAB — BMP8+EGFR
BUN/Creatinine Ratio: 29 — ABNORMAL HIGH (ref 10–24)
BUN: 55 mg/dL — ABNORMAL HIGH (ref 8–27)
CO2: 21 mmol/L (ref 20–29)
Calcium: 9.7 mg/dL (ref 8.6–10.2)
Chloride: 107 mmol/L — ABNORMAL HIGH (ref 96–106)
Creatinine, Ser: 1.9 mg/dL — ABNORMAL HIGH (ref 0.76–1.27)
Glucose: 100 mg/dL — ABNORMAL HIGH (ref 70–99)
Potassium: 4.3 mmol/L (ref 3.5–5.2)
Sodium: 142 mmol/L (ref 134–144)
eGFR: 34 mL/min/{1.73_m2} — ABNORMAL LOW (ref >59)

## 2022-07-26 LAB — HEMOGLOBIN A1C
Est. average glucose Bld gHb Est-mCnc: 128 mg/dL
Hgb A1c MFr Bld: 6.1 % — ABNORMAL HIGH (ref 4.8–5.6)

## 2022-07-26 LAB — TSH+FREE T4
Free T4: 0.89 ng/dL (ref 0.82–1.77)
TSH: 3.59 u[IU]/mL (ref 0.450–4.500)

## 2022-08-01 ENCOUNTER — Ambulatory Visit (HOSPITAL_COMMUNITY)
Admission: RE | Admit: 2022-08-01 | Discharge: 2022-08-01 | Disposition: A | Discharge status: Home / Self Care | Payer: Medicare Other | Source: Ambulatory Visit | Attending: Podiatry | Admitting: Podiatry

## 2022-08-01 DIAGNOSIS — I739 Peripheral vascular disease, unspecified: Secondary | ICD-10-CM | POA: Diagnosis not present

## 2022-08-02 LAB — VAS US ABI WITH/WO TBI

## 2022-08-04 DIAGNOSIS — H401132 Primary open-angle glaucoma, bilateral, moderate stage: Secondary | ICD-10-CM | POA: Diagnosis not present

## 2022-08-08 ENCOUNTER — Other Ambulatory Visit: Payer: Self-pay

## 2022-08-08 DIAGNOSIS — Z794 Long term (current) use of insulin: Secondary | ICD-10-CM

## 2022-08-08 MED ORDER — OZEMPIC (1 MG/DOSE) 2 MG/1.5ML ~~LOC~~ SOPN: 1.0000 mg | PEN_INJECTOR | SUBCUTANEOUS | 1 refills | Status: DC

## 2022-08-08 MED ORDER — FREESTYLE LIBRE 14 DAY SENSOR MISC: 1 refills | Status: DC

## 2022-08-11 ENCOUNTER — Ambulatory Visit (INDEPENDENT_AMBULATORY_CARE_PROVIDER_SITE_OTHER): Payer: Medicare Other | Admitting: Podiatry

## 2022-08-11 ENCOUNTER — Encounter: Payer: Self-pay | Admitting: Podiatry

## 2022-08-11 DIAGNOSIS — L97522 Non-pressure chronic ulcer of other part of left foot with fat layer exposed: Secondary | ICD-10-CM | POA: Diagnosis not present

## 2022-08-11 NOTE — Progress Notes (Signed)
  Subjective:  Patient ID: Micheal Walls, male    DOB: 04/27/1936,  MRN: [id number]  Chief Complaint  Patient presents with   Foot Ulcer    Left second toe 3 week follow up    87 y.o. male presents with the above complaint. History confirmed with patient.  Says it is doing better has been using the gentamicin  Objective:  Physical Exam: Foot remains warm and fairly well-perfused, he has weakly palpable DP pulse nonpalpable PT pulse, maceration and blistering around the nail of the third toe, nail is loose.  Appearance much better some surrounding hyperkeratosis no signs of infection or drainage today     Assessment:   1. Ulcer of left foot with fat layer exposed ([place])      Plan:  Patient was evaluated and treated and all questions answered.  Doing well should continue local wound care, the hyperkeratosis and wound bed was debrided excisionally with a sharp scalpel today.  No evidence of infection and still appears to be healing well.  I reviewed his ABIs with him which appear to be relatively unchanged.  As long as he shows continued signs of wound healing I do not see indication for further angiography at this point but if he stalls or regresses will have follow-up arranged with vascular surgery  Return in about 3 weeks (around 09/01/2022) for wound care.

## 2022-08-22 DIAGNOSIS — E113591 Type 2 diabetes mellitus with proliferative diabetic retinopathy without macular edema, right eye: Secondary | ICD-10-CM | POA: Diagnosis not present

## 2022-08-22 DIAGNOSIS — E113592 Type 2 diabetes mellitus with proliferative diabetic retinopathy without macular edema, left eye: Secondary | ICD-10-CM | POA: Diagnosis not present

## 2022-08-22 DIAGNOSIS — H353132 Nonexudative age-related macular degeneration, bilateral, intermediate dry stage: Secondary | ICD-10-CM | POA: Diagnosis not present

## 2022-08-22 DIAGNOSIS — H35372 Puckering of macula, left eye: Secondary | ICD-10-CM | POA: Diagnosis not present

## 2022-08-30 NOTE — Telephone Encounter (Signed)
No evaluation required

## 2022-09-01 ENCOUNTER — Ambulatory Visit (INDEPENDENT_AMBULATORY_CARE_PROVIDER_SITE_OTHER): Payer: Medicare Other | Admitting: Podiatry

## 2022-09-01 DIAGNOSIS — L97522 Non-pressure chronic ulcer of other part of left foot with fat layer exposed: Secondary | ICD-10-CM

## 2022-09-01 DIAGNOSIS — M869 Osteomyelitis, unspecified: Secondary | ICD-10-CM

## 2022-09-04 LAB — WOUND CULTURE
MICRO NUMBER:: 14723869
SPECIMEN QUALITY:: ADEQUATE

## 2022-09-04 NOTE — Progress Notes (Signed)
  Subjective:  Patient ID: Micheal Walls, male    DOB: February 15, 1936,  MRN: [id number]  Chief Complaint  Patient presents with   Foot Ulcer    3 week follow up left foot    87 y.o. male presents with the above complaint. History confirmed with patient.  Says it is doing better has been using the gentamicin  Objective:  Physical Exam: Foot remains warm and fairly well-perfused, he has weakly palpable DP pulse nonpalpable PT pulse, full-thickness ulceration third toe some surrounding hyperkeratosis some serous drainage today.  Measures 0.8 x 0.4 cm     Assessment:   1. Ulcer of left foot with fat layer exposed ([place])      Plan:  Patient was evaluated and treated and all questions answered.  Doing well should continue local wound care, the hyperkeratosis and wound bed was debrided excisionally with a sharp scalpel today of nonviable tissue and slough.  There was some drainage and wound culture of this was taken.  Postdebridement measurements are noted above.  Continue home wound care with gentamicin ointment.  Return in about 3 weeks (around 09/22/2022) for wound care.

## 2022-09-05 ENCOUNTER — Other Ambulatory Visit: Payer: Self-pay

## 2022-09-05 MED ORDER — GENTAMICIN SULFATE 0.1 % EX OINT: 1.0000 | TOPICAL_OINTMENT | Freq: Three times a day (TID) | CUTANEOUS | 0 refills | Status: DC

## 2022-09-22 ENCOUNTER — Ambulatory Visit (INDEPENDENT_AMBULATORY_CARE_PROVIDER_SITE_OTHER): Payer: Medicare Other | Admitting: Podiatry

## 2022-09-22 ENCOUNTER — Telehealth: Payer: Self-pay | Admitting: Podiatry

## 2022-09-22 DIAGNOSIS — L97522 Non-pressure chronic ulcer of other part of left foot with fat layer exposed: Secondary | ICD-10-CM

## 2022-09-22 MED ORDER — AMOXICILLIN-POT CLAVULANATE 875-125 MG PO TABS: 1.0000 | ORAL_TABLET | Freq: Two times a day (BID) | ORAL | 0 refills | Status: DC

## 2022-09-22 MED ORDER — CASTELLANI PAINT 1.5 % EX LIQD: CUTANEOUS | 0 refills | Status: DC

## 2022-09-22 NOTE — Addendum Note (Signed)
Addended byLilian Kapur, Danylle Ouk R on: 09/22/2022 05:01 PM   Modules accepted: Orders

## 2022-09-22 NOTE — Telephone Encounter (Signed)
Patient and stated that he would like a refill on Castellani 1.5%  Please advise

## 2022-09-25 NOTE — Progress Notes (Signed)
  Subjective:  Patient ID: Micheal Walls, male    DOB: [birth year],  MRN: [id number]  Chief Complaint  Patient presents with   Foot Ulcer    3 week follow up left foot    87 y.o. male presents with the above complaint. History confirmed with patient.  Says it is doing better has been using the gentamicin  Objective:  Physical Exam: Foot remains warm and fairly well-perfused, he has weakly palpable DP pulse nonpalpable PT pulse, full-thickness ulceration third toe some surrounding hyperkeratosis some serous drainage today.  Measures 0.5 x 0.2 cm    Culture showed skin flora  Assessment:   1. Ulcer of left foot with fat layer exposed      Plan:  Patient was evaluated and treated and all questions answered.  Doing well should continue local wound care, the hyperkeratosis and wound bed was debrided excisionally with a sharp scalpel today of nonviable tissue and slough.  Postdebridement measurements are noted above.  This was done to the subcutaneous layer.  There were no signs of infection today.  We will keep him on the Augmentin for now.  Continue utilizing gentamicin ointment change daily.  I will see him back in 3 weeks for follow-up.  No follow-ups on file.

## 2022-10-03 ENCOUNTER — Other Ambulatory Visit: Payer: Self-pay | Admitting: Nurse Practitioner

## 2022-10-03 DIAGNOSIS — E039 Hypothyroidism, unspecified: Secondary | ICD-10-CM

## 2022-10-03 DIAGNOSIS — I739 Peripheral vascular disease, unspecified: Secondary | ICD-10-CM

## 2022-10-03 DIAGNOSIS — G629 Polyneuropathy, unspecified: Secondary | ICD-10-CM

## 2022-10-04 ENCOUNTER — Other Ambulatory Visit: Payer: Self-pay

## 2022-10-04 ENCOUNTER — Telehealth: Payer: Self-pay

## 2022-10-04 DIAGNOSIS — G629 Polyneuropathy, unspecified: Secondary | ICD-10-CM

## 2022-10-04 DIAGNOSIS — I739 Peripheral vascular disease, unspecified: Secondary | ICD-10-CM

## 2022-10-04 DIAGNOSIS — E039 Hypothyroidism, unspecified: Secondary | ICD-10-CM

## 2022-10-04 MED ORDER — PANTOPRAZOLE SODIUM 40 MG PO TBEC: DELAYED_RELEASE_TABLET | ORAL | 1 refills | Status: DC

## 2022-10-04 MED ORDER — SYNTHROID 25 MCG PO TABS
ORAL_TABLET | ORAL | 2 refills | Status: DC
Start: 2022-10-04 — End: 2023-10-24

## 2022-10-04 MED ORDER — GABAPENTIN 100 MG PO CAPS
ORAL_CAPSULE | ORAL | 1 refills | Status: DC
Start: 2022-10-04 — End: 2023-02-14

## 2022-10-04 MED ORDER — FUROSEMIDE 40 MG PO TABS: 40.0000 mg | ORAL_TABLET | Freq: Two times a day (BID) | ORAL | 1 refills | Status: DC

## 2022-10-04 NOTE — Progress Notes (Signed)
Care Management & Coordination Services Pharmacy Team  Reason for Encounter: Medication coordination and delivery  Contacted patient to discuss medications and coordinate delivery from [facility]. Unsuccessful outreach. Left voicemail for patient to return call. Cycle dispensing form sent to [name] for review.   Last adherence delivery date: 07-18-2022  Patient is due for next adherence delivery on:  10-14-2022  This delivery to include: Adherence Packaging  90 Days  Allopurinol 100 mg- 2 tablets daily (breakfast) Flomax 0.4 mg- 1 tablet daily (breakfast)   Diltiazem CD 240 mg- 1 tablet daily (breakfast) Pantoprazole 40 mg- 1 tablet daily (bedtime) Colchicine 0.6 mg- 1 tablet once weekly on Sunday (breakfast) Gabapentin 100 mg- 2 tablets at breakfast,  1 tablet at lunch, 1 tablet with evening meal Furosemide 40 mg- 1 tablet with breakfast and 1 tablet with evening meal Rosuvastatin 10 mg- 1 tablet daily (bedtime) Synthroid 25 mcg- 1 tablet at breakfast Monday-Friday   Patient declined the following medications this month: Unable to reach patient    Refills requested from providers include: Pantoprazole  Synthroid  Gabapentin  Furosemide   Delivery scheduled for 10-14-2022. Unable to speak with patient to confirm date.    Chart review: Recent office visits:  07-25-2022 [name], [name]. Glucose= 100, BUN= 55, Creatinine= 1.90, eGFR= 34, BUN/Creatinine= 29, chloride= 107. A1C= 6.1. Cholesterol total= 95, HDL= 34  Recent consult visits:  09-22-2022 Edwin Cap, DPM (Podiatry). Follow up visit for wound check.  09-01-2022 Edwin Cap, DPM (Podiatry). Follow up visit for wound check.  08-11-2022 Edwin Cap, DPM (Podiatry). Follow up visit for wound check.  07-21-2022 Edwin Cap, DPM (Podiatry). Follow up visit for wound check.  Hospital visits:  None in previous 6 months  Medications: Outpatient Encounter Medications as of  10/04/2022  Medication Sig   allopurinol (ZYLOPRIM) 100 MG tablet Take 100 mg by mouth 2 (two) times daily.   amoxicillin-clavulanate (AUGMENTIN) 875-125 MG tablet Take 1 tablet by mouth 2 (two) times daily.   aspirin EC 81 MG tablet Take 81 mg by mouth daily. Swallow whole.   Castellani Paint 1.5 % LIQD Apply between toes daily   colchicine 0.6 MG tablet Take 0.6 mg by mouth once a week.   Continuous Blood Gluc Sensor (FREESTYLE LIBRE 14 DAY SENSOR) MISC Use as directed to check blood sugars   diltiazem (CARDIZEM CD) 240 MG 24 hr capsule TAKE ONE CAPSULE BY MOUTH ONCE DAILY   dorzolamide (TRUSOPT) 2 % ophthalmic solution Place 1 drop into both eyes 2 (two) times daily.   ferrous sulfate 325 (65 FE) MG tablet Take 1 tablet (325 mg total) by mouth 2 (two) times daily with a meal.   furosemide (LASIX) 40 MG tablet Take 1 tablet (40 mg total) by mouth 2 (two) times daily.   gabapentin (NEURONTIN) 100 MG capsule TAKE TWO CAPSULES BY MOUTH EVERY MORNING and TAKE ONE CAPSULE BY MOUTH AT NOON and TAKE ONE CAPSULE BY MOUTH EVERY EVENING   gentamicin ointment (GARAMYCIN) 0.1 % Apply 1 Application topically 3 (three) times daily.   glucose blood (FREESTYLE PRECISION NEO TEST) test strip Use as instructed   ketotifen (ZADITOR) 0.025 % ophthalmic solution Place 1 drop into both eyes 2 (two) times daily.   latanoprost (XALATAN) 0.005 % ophthalmic solution Place 1 drop into both eyes at bedtime.    Multiple Vitamins-Minerals (MULTIVITAMIN WITH MINERALS) tablet Take 1 tablet by mouth daily.   Multiple Vitamins-Minerals (PRESERVISION AREDS) CAPS Take 2 capsules by mouth daily.  pantoprazole (PROTONIX) 40 MG tablet TAKE ONE TABLET BY MOUTH EVERYDAY AT BEDTIME   rosuvastatin (CRESTOR) 20 MG tablet TAKE ONE TABLET BY MOUTH EVERYDAY AT BEDTIME   Semaglutide, [name], (OZEMPIC, [name],) 2 MG/1.5ML SOPN Inject 1 mg into the skin once a week.   senna-docusate (SENOKOT-S) 8.6-50 MG tablet Take 1 tablet by mouth 2  (two) times daily between meals as needed for mild constipation or moderate constipation. (Patient taking differently: Take 1 tablet by mouth 2 (two) times daily as needed (constipation).)   SYNTHROID 25 MCG tablet Take 1 tablet by mouth Monday - Friday   tamsulosin (FLOMAX) 0.4 MG CAPS capsule Take 0.4 mg by mouth daily.   No facility-administered encounter medications on file as of 10/04/2022.   BP Readings from Last 3 Encounters:  07/25/22 (!) 142/80  05/31/22 (!) 122/56  05/26/22 130/60    Pulse Readings from Last 3 Encounters:  07/25/22 78  05/31/22 75  05/26/22 (!) 57    Lab Results  Component Value Date/Time   HGBA1C 6.1 (H) 07/25/2022 11:36 AM   HGBA1C 6.1 (H) 03/21/2022 02:32 PM   Lab Results  Component Value Date   CREATININE 1.90 (H) 07/25/2022   BUN 55 (H) 07/25/2022   GFRNONAA 30 (L) 04/26/2022   GFRAA 42 (L) 05/20/2020   NA 142 07/25/2022   K 4.3 07/25/2022   CALCIUM 9.7 07/25/2022   CO2 21 07/25/2022   10-04-2022: 1st attempt left VM 10-05-2022: 2nd attempt left VM. Asked to speak with patient for wife's delivery, wife stated patient was busy and to call back later since she doesn't know  10-07-2022: 3rd attempt left VM with patient and daughter.   Huey Romans [facility] Clinical Pharmacist Assistant [phone]

## 2022-10-13 ENCOUNTER — Ambulatory Visit (INDEPENDENT_AMBULATORY_CARE_PROVIDER_SITE_OTHER): Payer: Medicare Other | Admitting: Podiatry

## 2022-10-13 DIAGNOSIS — L97522 Non-pressure chronic ulcer of other part of left foot with fat layer exposed: Secondary | ICD-10-CM | POA: Diagnosis not present

## 2022-10-13 NOTE — Progress Notes (Signed)
  Subjective:  Patient ID: Micheal Walls, male    DOB: Dec 24, 1935,  MRN: [id number]  Chief Complaint  Patient presents with   Foot Ulcer    2 week follow up left foot     87 y.o. male presents with the above complaint. History confirmed with patient.  He is doing well he has not noticed any drainage  Objective:  Physical Exam: Foot remains warm and fairly well-perfused, he has weakly palpable DP pulse nonpalpable PT pulse, third toe ulceration has healed with a small area of scab, no active drainage edema or cellulitis    Culture showed skin flora  Assessment:   1. Ulcer of left foot with fat layer exposed (HCC)      Plan:  Patient was evaluated and treated and all questions answered.  Overall doing very well, wound is healed at this point.  May leave open to air continue to apply the ointment.  I will see him back in 6 weeks and then transition back to quarterly visits for at risk diabetic footcare.  Return in about 6 weeks (around 11/24/2022) for at risk diabetic foot care.

## 2022-10-27 ENCOUNTER — Telehealth: Payer: Self-pay | Admitting: Hematology and Oncology

## 2022-11-01 ENCOUNTER — Inpatient Hospital Stay: Payer: Medicare Other

## 2022-11-01 ENCOUNTER — Inpatient Hospital Stay: Payer: Medicare Other | Admitting: Hematology and Oncology

## 2022-11-08 ENCOUNTER — Other Ambulatory Visit: Payer: Self-pay

## 2022-11-08 ENCOUNTER — Inpatient Hospital Stay (HOSPITAL_BASED_OUTPATIENT_CLINIC_OR_DEPARTMENT_OTHER): Payer: Medicare Other | Admitting: Hematology and Oncology

## 2022-11-08 ENCOUNTER — Inpatient Hospital Stay: Payer: Medicare Other | Attending: Hematology and Oncology

## 2022-11-08 ENCOUNTER — Other Ambulatory Visit: Payer: Self-pay | Admitting: Hematology and Oncology

## 2022-11-08 ENCOUNTER — Inpatient Hospital Stay: Payer: Medicare Other | Admitting: Hematology and Oncology

## 2022-11-08 VITALS — BP 146/68 | HR 79 | Temp 97.5°F | Resp 16 | Wt 253.2 lb

## 2022-11-08 DIAGNOSIS — Z87891 Personal history of nicotine dependence: Secondary | ICD-10-CM | POA: Insufficient documentation

## 2022-11-08 DIAGNOSIS — D472 Monoclonal gammopathy: Secondary | ICD-10-CM | POA: Diagnosis not present

## 2022-11-08 DIAGNOSIS — G4733 Obstructive sleep apnea (adult) (pediatric): Secondary | ICD-10-CM | POA: Insufficient documentation

## 2022-11-08 DIAGNOSIS — D649 Anemia, unspecified: Secondary | ICD-10-CM | POA: Diagnosis not present

## 2022-11-08 DIAGNOSIS — Z809 Family history of malignant neoplasm, unspecified: Secondary | ICD-10-CM | POA: Insufficient documentation

## 2022-11-08 DIAGNOSIS — N183 Chronic kidney disease, stage 3 unspecified: Secondary | ICD-10-CM | POA: Diagnosis not present

## 2022-11-08 LAB — CBC WITH DIFFERENTIAL (CANCER CENTER ONLY)
Abs Immature Granulocytes: 0.01 10*3/uL (ref 0.00–0.07)
Basophils Absolute: 0 10*3/uL (ref 0.0–0.1)
Basophils Relative: 0 %
Eosinophils Absolute: 0.3 10*3/uL (ref 0.0–0.5)
Eosinophils Relative: 4 %
HCT: 35.2 % — ABNORMAL LOW (ref 39.0–52.0)
Hemoglobin: 11.7 g/dL — ABNORMAL LOW (ref 13.0–17.0)
Immature Granulocytes: 0 %
Lymphocytes Relative: 26 %
Lymphs Abs: 1.6 10*3/uL (ref 0.7–4.0)
MCH: 29.5 pg (ref 26.0–34.0)
MCHC: 33.2 g/dL (ref 30.0–36.0)
MCV: 88.7 fL (ref 80.0–100.0)
Monocytes Absolute: 0.6 10*3/uL (ref 0.1–1.0)
Monocytes Relative: 9 %
Neutro Abs: 3.8 10*3/uL (ref 1.7–7.7)
Neutrophils Relative %: 61 %
Platelet Count: 162 10*3/uL (ref 150–400)
RBC: 3.97 MIL/uL — ABNORMAL LOW (ref 4.22–5.81)
RDW: 15.6 % — ABNORMAL HIGH (ref 11.5–15.5)
WBC Count: 6.2 10*3/uL (ref 4.0–10.5)
nRBC: 0 % (ref 0.0–0.2)

## 2022-11-08 LAB — CMP (CANCER CENTER ONLY)
ALT: 34 U/L (ref 0–44)
AST: 32 U/L (ref 15–41)
Albumin: 3.8 g/dL (ref 3.5–5.0)
Alkaline Phosphatase: 88 U/L (ref 38–126)
Anion gap: 6 (ref 5–15)
BUN: 55 mg/dL — ABNORMAL HIGH (ref 8–23)
CO2: 28 mmol/L (ref 22–32)
Calcium: 10.2 mg/dL (ref 8.9–10.3)
Chloride: 106 mmol/L (ref 98–111)
Creatinine: 1.91 mg/dL — ABNORMAL HIGH (ref 0.61–1.24)
GFR, Estimated: 34 mL/min — ABNORMAL LOW (ref >60)
Glucose, Bld: 120 mg/dL — ABNORMAL HIGH (ref 70–99)
Potassium: 3.7 mmol/L (ref 3.5–5.1)
Sodium: 140 mmol/L (ref 135–145)
Total Bilirubin: 0.4 mg/dL (ref 0.3–1.2)
Total Protein: 7.1 g/dL (ref 6.5–8.1)

## 2022-11-08 LAB — LACTATE DEHYDROGENASE: LDH: 135 U/L (ref 98–192)

## 2022-11-08 NOTE — Progress Notes (Unsigned)
[facility] [phone]   [phone]  PROGRESS NOTE  Patient Care Team: [name], [name] as PCP - General (General Practice) [name], Rachelle Hora, MD as PCP - Cardiology (Cardiology) Clarene Duke, Karma Lew, RN as [facility] Care Management Pearson, Pershing Cox, [facility] (Pharmacist)  CHIEF COMPLAINTS/PURPOSE OF CONSULTATION:  MGUS  HISTORY OF PRESENTING ILLNESS:  Micheal Walls 87 y.o. male returns for a follow up for monoclonal gammopathy after completing initial workup. He is unaccompanied for this visit.   On exam today Mr. Rogus reports *** He denies any fevers, chills, night sweats, shortness of breath, chest pain or cough.  He has no other complaints.  Rest of the 10 point ROS is below.  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   Anemia    Aspiration pneumonia (HCC) 11/02/2019   Diastolic heart failure (HCC)    Diverticulosis    DM (diabetes mellitus) (HCC)    Gout    Hyperlipemia    OSA on CPAP    RBBB    Sepsis (HCC) 11/02/2019   Systemic hypertension     SURGICAL HISTORY: Past Surgical History:  Procedure Laterality Date   ABDOMINAL AORTOGRAM W/LOWER EXTREMITY Left 08/11/2020   Procedure: ABDOMINAL AORTOGRAM W/LOWER EXTREMITY;  Surgeon: Nada Libman, MD;  Location: [facility] INVASIVE CV LAB;  Service: Cardiovascular;  Laterality: Left;   ABDOMINAL AORTOGRAM W/LOWER EXTREMITY N/A 02/26/2021   Procedure: ABDOMINAL AORTOGRAM W/LOWER EXTREMITY;  Surgeon: Leonie Douglas, MD;  Location: [facility] INVASIVE CV LAB;  Service: Cardiovascular;  Laterality: N/A;   AMPUTATION Left 09/14/2020   Procedure: AMPUTATION LEFT GREAT TOE;  Surgeon: Edwin Cap, DPM;  Location: [facility] OR;  Service: Podiatry;  Laterality: Left;   AMPUTATION TOE Left 12/03/2021   Procedure: AMPUTATION TOE, LEFT second;  Surgeon: Louann Sjogren, DPM;  Location: [facility] OR;  Service:  Podiatry;  Laterality: Left;   LEG AMPUTATION BELOW KNEE     right   NM MYOCAR PERF WALL MOTION  09/18/2009   no ischemia   PERIPHERAL VASCULAR BALLOON ANGIOPLASTY Left 08/11/2020   Procedure: PERIPHERAL VASCULAR BALLOON ANGIOPLASTY;  Surgeon: Nada Libman, MD;  Location: [facility] INVASIVE CV LAB;  Service: Cardiovascular;  Laterality: Left;  AT   PERIPHERAL VASCULAR INTERVENTION Left 02/26/2021   Procedure: PERIPHERAL VASCULAR INTERVENTION;  Surgeon: Leonie Douglas, MD;  Location: [facility] INVASIVE CV LAB;  Service: Cardiovascular;  Laterality: Left;    SOCIAL HISTORY: Social History   Socioeconomic History   Marital status: Married    Spouse name: Not on file   Number of children: Not on file   Years of education: Not on file   Highest education level: Not on file  Occupational History   Occupation: retired  Tobacco Use   Smoking status: Former    Types: Cigars    Quit date: 1997    Years since quitting: 27.4   Smokeless tobacco: Never  Vaping Use   Vaping Use: Former  Substance and Sexual Activity   Alcohol use: No    Alcohol/week: 0.0 standard drinks of alcohol   Drug use: No   Sexual activity: Not Currently  Other Topics Concern   Not on file  Social History Narrative   Not on file   Social Determinants of Health   Financial Resource Strain: Low Risk  (05/26/2022)   Overall Financial Resource Strain (CARDIA)    Difficulty of  Paying Living Expenses: Not hard at all  Food Insecurity: No Food Insecurity (05/26/2022)   Hunger Vital Sign    Worried About Running Out of Food in the Last Year: Never true    Ran Out of Food in the Last Year: Never true  Transportation Needs: No Transportation Needs (05/26/2022)   PRAPARE - Administrator, Civil Service (Medical): No    Lack of Transportation (Non-Medical): No  Physical Activity: Inactive (05/26/2022)   Exercise Vital Sign    Days of Exercise per Week: 0 days    Minutes of Exercise per Session: 0 min  Stress: No  Stress Concern Present (05/26/2022)   [employer] of Occupational Health - Occupational Stress Questionnaire    Feeling of Stress : Not at all  Social Connections: Not on file  Intimate Partner Violence: Unknown (05/15/2019)   Humiliation, Afraid, Rape, and Kick questionnaire    Fear of Current or Ex-Partner: No    Emotionally Abused: Not on file    Physically Abused: Not on file    Sexually Abused: Not on file    FAMILY HISTORY: Family History  Problem Relation Age of Onset   Diabetes Mother    Heart attack Father    Cancer Sister     ALLERGIES:  is allergic to vancomycin.  MEDICATIONS:  Current Outpatient Medications  Medication Sig Dispense Refill   allopurinol (ZYLOPRIM) 100 MG tablet Take 100 mg by mouth 2 (two) times daily.     amoxicillin-clavulanate (AUGMENTIN) 875-125 MG tablet Take 1 tablet by mouth 2 (two) times daily. 20 tablet 0   aspirin EC 81 MG tablet Take 81 mg by mouth daily. Swallow whole.     Castellani Paint 1.5 % LIQD Apply between toes daily 30 mL 0   colchicine 0.6 MG tablet Take 0.6 mg by mouth once a week.     Continuous Blood Gluc Sensor (FREESTYLE LIBRE 14 DAY SENSOR) MISC Use as directed to check blood sugars 6 each 1   diltiazem (CARDIZEM CD) 240 MG 24 hr capsule TAKE ONE CAPSULE BY MOUTH ONCE DAILY 90 capsule 3   dorzolamide (TRUSOPT) 2 % ophthalmic solution Place 1 drop into both eyes 2 (two) times daily.     ferrous sulfate 325 (65 FE) MG tablet Take 1 tablet (325 mg total) by mouth 2 (two) times daily with a meal.  3   furosemide (LASIX) 40 MG tablet Take 1 tablet (40 mg total) by mouth 2 (two) times daily. 180 tablet 1   gabapentin (NEURONTIN) 100 MG capsule TAKE TWO CAPSULES BY MOUTH EVERY MORNING, TAKE ONE CAPSULE BY MOUTH AT NOON, TAKE ONE CAPSULE BY MOUTH EVERY EVENING 360 capsule 1   gentamicin ointment (GARAMYCIN) 0.1 % Apply 1 Application topically 3 (three) times daily. 15 g 0   glucose blood (FREESTYLE PRECISION NEO TEST) test  strip Use as instructed 100 each 12   ketotifen (ZADITOR) 0.025 % ophthalmic solution Place 1 drop into both eyes 2 (two) times daily.     latanoprost (XALATAN) 0.005 % ophthalmic solution Place 1 drop into both eyes at bedtime.      Multiple Vitamins-Minerals (MULTIVITAMIN WITH MINERALS) tablet Take 1 tablet by mouth daily.     Multiple Vitamins-Minerals (PRESERVISION AREDS) CAPS Take 2 capsules by mouth daily.     pantoprazole (PROTONIX) 40 MG tablet TAKE ONE TABLET BY MOUTH EVERYDAY AT BEDTIME 90 tablet 1   rosuvastatin (CRESTOR) 20 MG tablet TAKE ONE TABLET BY MOUTH EVERYDAY AT BEDTIME 90  tablet 3   Semaglutide, [name], (OZEMPIC, [name],) 2 MG/1.5ML SOPN Inject 1 mg into the skin once a week. 9 mL 1   senna-docusate (SENOKOT-S) 8.6-50 MG tablet Take 1 tablet by mouth 2 (two) times daily between meals as needed for mild constipation or moderate constipation. (Patient taking differently: Take 1 tablet by mouth 2 (two) times daily as needed (constipation).) 60 tablet 0   SYNTHROID 25 MCG tablet Take 1 tablet by mouth Monday - Friday 60 tablet 2   tamsulosin (FLOMAX) 0.4 MG CAPS capsule Take 0.4 mg by mouth daily.     No current facility-administered medications for this visit.    REVIEW OF SYSTEMS:   Constitutional: ( - ) fevers, ( - )  chills , ( - ) night sweats Eyes: ( - ) blurriness of vision, ( - ) double vision, ( - ) watery eyes Ears, nose, mouth, throat, and face: ( - ) mucositis, ( - ) sore throat Respiratory: ( - ) cough, ( - ) dyspnea, ( - ) wheezes Cardiovascular: ( - ) palpitation, ( - ) chest discomfort, ( - ) lower extremity swelling Gastrointestinal:  ( - ) nausea, ( - ) heartburn, ( - ) change in bowel habits Skin: ( - ) abnormal skin rashes Lymphatics: ( - ) new lymphadenopathy, ( - ) easy bruising Neurological: ( - ) numbness, ( - ) tingling, ( - ) new weaknesses Behavioral/Psych: ( - ) mood change, ( - ) new changes  All other systems were reviewed with the  patient and are negative.  PHYSICAL EXAMINATION: ECOG PERFORMANCE STATUS: 1 - Symptomatic but completely ambulatory  There were no vitals filed for this visit.  There were no vitals filed for this visit.   GENERAL: well appearing elderly African-American male in NAD, patient was in wheelchair during visit.   SKIN: skin color, texture, turgor are normal, no rashes or significant lesions EYES: conjunctiva are pink and non-injected, sclera clear LUNGS: clear to auscultation and percussion with normal breathing effort HEART: regular rate & rhythm and no murmurs and no lower extremity edema PSYCH: alert & oriented x 3, fluent speech NEURO: no focal motor/sensory deficits  LABORATORY DATA:  I have reviewed the data as listed    Latest Ref Rng & Units 04/26/2022    9:56 AM 01/17/2022   12:42 PM 01/17/2022   11:44 AM  CBC  WBC 4.0 - 10.5 K/uL 6.2   6.0   Hemoglobin 13.0 - 17.0 g/dL 16.1  09.6  04.5   Hematocrit 39.0 - 52.0 % 34.6  31.6  32.0   Platelets 150 - 400 K/uL 184   170        Latest Ref Rng & Units 07/25/2022   11:36 AM 04/26/2022    9:56 AM 03/21/2022    2:32 PM  CMP  Glucose 70 - 99 mg/dL 409  811  94   BUN 8 - 27 mg/dL 55  69  56   Creatinine 0.76 - 1.27 mg/dL 9.14  7.82  9.56   Sodium 134 - 144 mmol/L 142  140  142   Potassium 3.5 - 5.2 mmol/L 4.3  3.9  4.2   Chloride 96 - 106 mmol/L 107  106  104   CO2 20 - 29 mmol/L 21  28  24    Calcium 8.6 - 10.2 mg/dL 9.7  21.3  08.6   Total Protein 6.5 - 8.1 g/dL  7.8  7.0   Total Bilirubin 0.3 - 1.2  mg/dL  0.4  0.3   Alkaline Phos 38 - 126 U/L  86  93   AST 15 - 41 U/L  23  30   ALT 0 - 44 U/L  29  36     RADIOGRAPHIC STUDIES: No results found.  ASSESSMENT & PLAN SEVAN FINSETH is a 87 y.o. male who presents for a follow up for monoclonal gammopathy.   #MGUS: --SPEP from 12/13/2021 detected M protein measuring 0.6 g/dL. Immunofixation revealed monoclonal free lambda light chain. Kappa free light chains 182.3 mg/L,  Lambda free light chains 90.3 mg/L and Ratio 2.02.  --Bone survey from 12/30/2021 showed no lytic or destructive lesions.  --Bone marrow biopsy from 01/17/2022 showed 9% plasma cells.  --UPEP from 01/17/2022 showed no evidence of M protein.  -- Labs today show white blood cell count *** --Above findings are most consistent with MGUS.  --RTC in 6 months with repeat labs 1 week prior.   #Chronic kidney disease, stage III: --Under the care of nephrologist, Dr. Annie Sable at [facility].  --CKD felt to be secondary to diabetes and hypertension --Recommend considering kidney biopsy if there is suspicion for paraproteinemia as underlying cause of CKD.  --Most recent creatinine level has been stable at ***  #Chronic normocytic anemia: --Likely secondary to chronic kidney disease, stage III +/- paraproteinemia.  --Workup from 12/13/2021 showed no evidence of vitamin B12, folate and iron deficiency.  --Most recent labs show stable anemia with Hgb ***  No orders of the defined types were placed in this encounter.   All questions were answered. The patient knows to call the clinic with any problems, questions or concerns.  I have spent a total of 30 minutes minutes of face-to-face and non-face-to-face time, preparing to see the patient, obtaining and/or reviewing separately obtained history, performing a medically appropriate examination, counseling and educating the patient, documenting clinical information in the electronic health record,  and care coordination.    Ulysees Barns, MD Department of Hematology/Oncology [facility] Phone: [phone] Pager: [phone] Email: [email or web address]

## 2022-11-08 NOTE — Progress Notes (Signed)
[facility] [phone]   [phone]  PROGRESS NOTE  Patient Care Team: [name], [name] as PCP - General (General Practice) [name], Rachelle Hora, MD as PCP - Cardiology (Cardiology) Clarene Duke, Karma Lew, RN as [facility] Care Management Pearson, Pershing Cox, [facility] (Pharmacist)  CHIEF COMPLAINTS/PURPOSE OF CONSULTATION:  IgG Lambda MGUS  HISTORY OF PRESENTING ILLNESS:  Micheal Walls 87 y.o. male returns for a follow up for monoclonal gammopathy after completing initial workup. He is unaccompanied for this visit.   On exam today Micheal Walls reports he has had no major changes in his health in the interim since her last visit.  He had no ER visits, hospitalizations, or new medications.  He reports his energy is fair and currently he notes it is an 8 out of 10.  He notes that he is able to do everything he needs to do at home.  He is not having any bone or back pain and his appetite is strong.  He is not having any urinary issues such as bubbling or odor.  He reports he is not having any infectious symptoms such as cough, runny nose or sore throat.  Overall his health is steady.  He denies any fevers, chills, night sweats, shortness of breath, chest pain or cough.  He has no other complaints.  Rest of the 10 point ROS is below.  MEDICAL HISTORY:  Past Medical History:  Diagnosis Date   Acute metabolic encephalopathy 01/16/2020   Amputated below knee [facility])    right   Anemia    Aspiration pneumonia (HCC) 11/02/2019   Diastolic heart failure (HCC)    Diverticulosis    DM (diabetes mellitus) (HCC)    Gout    Hyperlipemia    OSA on CPAP    RBBB    Sepsis (HCC) 11/02/2019   Systemic hypertension     SURGICAL HISTORY: Past Surgical History:  Procedure Laterality Date   ABDOMINAL AORTOGRAM W/LOWER EXTREMITY Left 08/11/2020   Procedure: ABDOMINAL AORTOGRAM W/LOWER EXTREMITY;  Surgeon: Nada Libman, MD;  Location: [facility] INVASIVE CV LAB;  Service:  Cardiovascular;  Laterality: Left;   ABDOMINAL AORTOGRAM W/LOWER EXTREMITY N/A 02/26/2021   Procedure: ABDOMINAL AORTOGRAM W/LOWER EXTREMITY;  Surgeon: Leonie Douglas, MD;  Location: [facility] INVASIVE CV LAB;  Service: Cardiovascular;  Laterality: N/A;   AMPUTATION Left 09/14/2020   Procedure: AMPUTATION LEFT GREAT TOE;  Surgeon: Edwin Cap, DPM;  Location: [facility] OR;  Service: Podiatry;  Laterality: Left;   AMPUTATION TOE Left 12/03/2021   Procedure: AMPUTATION TOE, LEFT second;  Surgeon: Louann Sjogren, DPM;  Location: [facility] OR;  Service: Podiatry;  Laterality: Left;   LEG AMPUTATION BELOW KNEE     right   NM MYOCAR PERF WALL MOTION  09/18/2009   no ischemia   PERIPHERAL VASCULAR BALLOON ANGIOPLASTY Left 08/11/2020   Procedure: PERIPHERAL VASCULAR BALLOON ANGIOPLASTY;  Surgeon: Nada Libman, MD;  Location: [facility] INVAS[facility] CV LAB;  Service: Cardiovascular;  Laterality: Left;  AT   PERIPHERAL VASCULAR INTERVENTION Left 02/26/2021   Procedure: PERIPHERAL VASCULAR INTERVENTION;  Surgeon: Leonie Douglas, MD;  Location: [facility] INVAS[facility] CV LAB;  Service: Cardiovascular;  Laterality: Left;    SOCIAL HISTORY: Social History   Socioeconomic History   Marital status: Married    Spouse name: Not on file   Number of children: Not on file   Years of education: Not on file   Highest education level: Not on file  Occupational History   Occupation: retired  Tobacco Use   Smoking status: Former    Types: Cigars    Quit date: 1997    Years since quitting: 27.4   Smokeless tobacco: Never  Vaping Use   Vaping Use: Former  Substance and Sexual Activity   Alcohol use: No    Alcohol/week: 0.0 standard drinks of alcohol   Drug use: No   Sexual activity: Not Currently  Other Topics Concern   Not on file  Social History Narrative   Not on file   Social Determinants of Health   Financial Resource Strain: Low Risk  (05/26/2022)   Overall Financial Resource Strain (CARDIA)    Difficulty of Paying Living  Expenses: Not hard at all  Food Insecurity: No Food Insecurity (05/26/2022)   Hunger Vital Sign    Worried About Running Out of Food in the Last Year: Never true    Ran Out of Food in the Last Year: Never true  Transportation Needs: No Transportation Needs (05/26/2022)   PRAPARE - Administrator, Civil Service (Medical): No    Lack of Transportation (Non-Medical): No  Physical Activity: Inactive (05/26/2022)   Exercise Vital Sign    Days of Exercise per Week: 0 days    Minutes of Exercise per Session: 0 min  Stress: No Stress Concern Present (05/26/2022)   [employer] of Occupational Health - Occupational Stress Questionnaire    Feeling of Stress : Not at all  Social Connections: Not on file  Intimate Partner Violence: Unknown (05/15/2019)   Humiliation, Afraid, Rape, and Kick questionnaire    Fear of Current or Ex-Partner: No    Emotionally Abused: Not on file    Physically Abused: Not on file    Sexually Abused: Not on file    FAMILY HISTORY: Family History  Problem Relation Age of Onset   Diabetes Mother    Heart attack Father    Cancer Sister     ALLERGIES:  is allergic to vancomycin.  MEDICATIONS:  Current Outpatient Medications  Medication Sig Dispense Refill   allopurinol (ZYLOPRIM) 100 MG tablet Take 100 mg by mouth 2 (two) times daily.     amoxicillin-clavulanate (AUGMENTIN) 875-125 MG tablet Take 1 tablet by mouth 2 (two) times daily. 20 tablet 0   aspirin EC 81 MG tablet Take 81 mg by mouth daily. Swallow whole.     Castellani Paint 1.5 % LIQD Apply between toes daily 30 mL 0   colchicine 0.6 MG tablet Take 0.6 mg by mouth once a week.     Continuous Blood Gluc Sensor (FREESTYLE LIBRE 14 DAY SENSOR) MISC Use as directed to check blood sugars 6 each 1   diltiazem (CARDIZEM CD) 240 MG 24 hr capsule TAKE ONE CAPSULE BY MOUTH ONCE DAILY 90 capsule 3   dorzolamide (TRUSOPT) 2 % ophthalmic solution Place 1 drop into both eyes 2 (two) times daily.      ferrous sulfate 325 (65 FE) MG tablet Take 1 tablet (325 mg total) by mouth 2 (two) times daily with a meal.  3   furosemide (LASIX) 40 MG tablet Take 1 tablet (40 mg total) by mouth 2 (two) times daily. 180 tablet 1   gabapentin (NEURONTIN) 100 MG capsule TAKE TWO CAPSULES BY MOUTH EVERY MORNING, TAKE ONE CAPSULE BY MOUTH AT NOON, TAKE ONE CAPSULE BY MOUTH EVERY EVENING 360 capsule 1   gentamicin ointment (GARAMYCIN) 0.1 % Apply 1 Application topically 3 (three) times daily. 15 g 0   glucose blood (FREESTYLE PRECISION NEO  TEST) test strip Use as instructed 100 each 12   ketotifen (ZADITOR) 0.025 % ophthalmic solution Place 1 drop into both eyes 2 (two) times daily.     latanoprost (XALATAN) 0.005 % ophthalmic solution Place 1 drop into both eyes at bedtime.      Multiple Vitamins-Minerals (MULTIVITAMIN WITH MINERALS) tablet Take 1 tablet by mouth daily.     Multiple Vitamins-Minerals (PRESERVISION AREDS) CAPS Take 2 capsules by mouth daily.     pantoprazole (PROTONIX) 40 MG tablet TAKE ONE TABLET BY MOUTH EVERYDAY AT BEDTIME 90 tablet 1   rosuvastatin (CRESTOR) 20 MG tablet TAKE ONE TABLET BY MOUTH EVERYDAY AT BEDTIME 90 tablet 3   Semaglutide, [name], (OZEMPIC, [name],) 2 MG/1.5ML SOPN Inject 1 mg into the skin once a week. 9 mL 1   senna-docusate (SENOKOT-S) 8.6-50 MG tablet Take 1 tablet by mouth 2 (two) times daily between meals as needed for mild constipation or moderate constipation. (Patient taking differently: Take 1 tablet by mouth 2 (two) times daily as needed (constipation).) 60 tablet 0   SYNTHROID 25 MCG tablet Take 1 tablet by mouth Monday - Friday 60 tablet 2   tamsulosin (FLOMAX) 0.4 MG CAPS capsule Take 0.4 mg by mouth daily.     No current facility-administered medications for this visit.    REVIEW OF SYSTEMS:   Constitutional: ( - ) fevers, ( - )  chills , ( - ) night sweats Eyes: ( - ) blurriness of vision, ( - ) double vision, ( - ) watery eyes Ears, nose, mouth,  throat, and face: ( - ) mucositis, ( - ) sore throat Respiratory: ( - ) cough, ( - ) dyspnea, ( - ) wheezes Cardiovascular: ( - ) palpitation, ( - ) chest discomfort, ( - ) lower extremity swelling Gastrointestinal:  ( - ) nausea, ( - ) heartburn, ( - ) change in bowel habits Skin: ( - ) abnormal skin rashes Lymphatics: ( - ) new lymphadenopathy, ( - ) easy bruising Neurological: ( - ) numbness, ( - ) tingling, ( - ) new weaknesses Behavioral/Psych: ( - ) mood change, ( - ) new changes  All other systems were reviewed with the patient and are negative.  PHYSICAL EXAMINATION: ECOG PERFORMANCE STATUS: 1 - Symptomatic but completely ambulatory  Vitals:   11/08/22 1120  BP: (!) 146/68  Pulse: 79  Resp: 16  Temp: (!) 97.5 F (36.4 C)  SpO2: 96%    Filed Weights   11/08/22 1120  Weight: 253 lb 3.2 oz (114.9 kg)     GENERAL: well appearing elderly African-American male in NAD, patient was in wheelchair during visit.   SKIN: skin color, texture, turgor are normal, no rashes or significant lesions EYES: conjunctiva are pink and non-injected, sclera clear LUNGS: clear to auscultation and percussion with normal breathing effort HEART: regular rate & rhythm and no murmurs and no lower extremity edema PSYCH: alert & oriented x 3, fluent speech NEURO: no focal motor/sensory deficits  LABORATORY DATA:  I have reviewed the data as listed    Latest Ref Rng & Units 11/08/2022   10:21 AM 04/26/2022    9:56 AM 01/17/2022   12:42 PM  CBC  WBC 4.0 - 10.5 K/uL 6.2  6.2    Hemoglobin 13.0 - 17.0 g/dL 16.1  09.6  04.5   Hematocrit 39.0 - 52.0 % 35.2  34.6  31.6   Platelets 150 - 400 K/uL 162  184  Latest Ref Rng & Units 11/08/2022   10:21 AM 07/25/2022   11:36 AM 04/26/2022    9:56 AM  CMP  Glucose 70 - 99 mg/dL 161  096  045   BUN 8 - 23 mg/dL 55  55  69   Creatinine 0.61 - 1.24 mg/dL 4.09  8.11  9.14   Sodium 135 - 145 mmol/L 140  142  140   Potassium 3.5 - 5.1 mmol/L 3.7  4.3   3.9   Chloride 98 - 111 mmol/L 106  107  106   CO2 22 - 32 mmol/L 28  21  28    Calcium 8.9 - 10.3 mg/dL 78.2  9.7  95.6   Total Protein 6.5 - 8.1 g/dL 7.1   7.8   Total Bilirubin 0.3 - 1.2 mg/dL 0.4   0.4   Alkaline Phos 38 - 126 U/L 88   86   AST 15 - 41 U/L 32   23   ALT 0 - 44 U/L 34   29     RADIOGRAPHIC STUDIES: No results found.  ASSESSMENT & PLAN Micheal Walls is a 87 y.o. male who presents for a follow up for monoclonal gammopathy.   #MGUS: --SPEP from 04/26/2022 detected M protein measuring 0.3 g/dL. Immunofixation revealed monoclonal free lambda light chain. Kappa free light chains 123.1 mg/L, Lambda free light chains 81.9 mg/L and Ratio 1.50.  --Bone survey from 12/30/2021 showed no lytic or destructive lesions.  --Bone marrow biopsy from 01/17/2022 showed 9% plasma cells.  --UPEP from 01/17/2022 showed no evidence of M protein.  -- Labs today show white blood cell count 6.2, Hgb 11.7, MCV 88.7, Plt 162 --Above findings are most consistent with MGUS.  --RTC in 6 months with repeat labs 1 week prior.   #Chronic kidney disease, stage III: --Under the care of nephrologist, Dr. Annie Sable at [facility].  --CKD felt to be secondary to diabetes and hypertension --Recommend considering kidney biopsy if there is suspicion for paraproteinemia as underlying cause of CKD.  --Most recent creatinine level has been stable at 1.91  #Chronic normocytic anemia: --Likely secondary to chronic kidney disease, stage III +/- paraproteinemia.  --Workup from 12/13/2021 showed no evidence of vitamin B12, folate and iron deficiency.  --Most recent labs show stable anemia with Hgb 11.7  No orders of the defined types were placed in this encounter.   All questions were answered. The patient knows to call the clinic with any problems, questions or concerns.  I have spent a total of 30 minutes minutes of face-to-face and non-face-to-face time, preparing to see the  patient, obtaining and/or reviewing separately obtained history, performing a medically appropriate examination, counseling and educating the patient, documenting clinical information in the electronic health record,  and care coordination.    Micheal Barns, MD Department of Hematology/Oncology [facility] Phone: [phone] Pager: [phone] Email: [email or web address]

## 2022-11-09 LAB — KAPPA/LAMBDA LIGHT CHAINS
Kappa free light chain: 128.1 mg/L — ABNORMAL HIGH (ref 3.3–19.4)
Kappa, lambda light chain ratio: 1.42 (ref 0.26–1.65)
Lambda free light chains: 89.9 mg/L — ABNORMAL HIGH (ref 5.7–26.3)

## 2022-11-16 LAB — MULTIPLE MYELOMA PANEL, SERUM
Albumin SerPl Elph-Mcnc: 3.4 g/dL (ref 2.9–4.4)
Albumin/Glob SerPl: 1.1 (ref 0.7–1.7)
Alpha 1: 0.2 g/dL (ref 0.0–0.4)
Alpha2 Glob SerPl Elph-Mcnc: 0.5 g/dL (ref 0.4–1.0)
B-Globulin SerPl Elph-Mcnc: 1.2 g/dL (ref 0.7–1.3)
Gamma Glob SerPl Elph-Mcnc: 1.3 g/dL (ref 0.4–1.8)
Globulin, Total: 3.3 g/dL (ref 2.2–3.9)
IgA: 644 mg/dL — ABNORMAL HIGH (ref 61–437)
IgG (Immunoglobin G), Serum: 1383 mg/dL (ref 603–1613)
IgM (Immunoglobulin M), Srm: 51 mg/dL (ref 15–143)
M Protein SerPl Elph-Mcnc: 0.4 g/dL — ABNORMAL HIGH
Total Protein ELP: 6.7 g/dL (ref 6.0–8.5)

## 2022-11-17 ENCOUNTER — Ambulatory Visit: Payer: Medicare Other | Admitting: Podiatry

## 2022-11-21 ENCOUNTER — Ambulatory Visit: Payer: Medicare Other | Admitting: Podiatry

## 2022-11-22 ENCOUNTER — Ambulatory Visit: Payer: Medicare Other | Admitting: Nurse Practitioner

## 2022-11-24 ENCOUNTER — Encounter: Payer: Self-pay | Admitting: Nurse Practitioner

## 2022-11-24 ENCOUNTER — Ambulatory Visit: Payer: Medicare Other | Admitting: Podiatry

## 2022-11-24 ENCOUNTER — Ambulatory Visit (INDEPENDENT_AMBULATORY_CARE_PROVIDER_SITE_OTHER): Payer: Medicare Other | Admitting: Nurse Practitioner

## 2022-11-24 VITALS — BP 130/66 | HR 85 | Temp 98.5°F | Ht 73.0 in | Wt 253.0 lb

## 2022-11-24 DIAGNOSIS — S98132D Complete traumatic amputation of one left lesser toe, subsequent encounter: Secondary | ICD-10-CM

## 2022-11-24 DIAGNOSIS — N189 Chronic kidney disease, unspecified: Secondary | ICD-10-CM

## 2022-11-24 DIAGNOSIS — E6609 Other obesity due to excess calories: Secondary | ICD-10-CM

## 2022-11-24 DIAGNOSIS — I130 Hypertensive heart and chronic kidney disease with heart failure and stage 1 through stage 4 chronic kidney disease, or unspecified chronic kidney disease: Secondary | ICD-10-CM | POA: Diagnosis not present

## 2022-11-24 DIAGNOSIS — Z794 Long term (current) use of insulin: Secondary | ICD-10-CM | POA: Diagnosis not present

## 2022-11-24 DIAGNOSIS — Z6833 Body mass index (BMI) 33.0-33.9, adult: Secondary | ICD-10-CM | POA: Diagnosis not present

## 2022-11-24 DIAGNOSIS — E113591 Type 2 diabetes mellitus with proliferative diabetic retinopathy without macular edema, right eye: Secondary | ICD-10-CM | POA: Diagnosis not present

## 2022-11-24 DIAGNOSIS — E1159 Type 2 diabetes mellitus with other circulatory complications: Secondary | ICD-10-CM | POA: Diagnosis not present

## 2022-11-24 DIAGNOSIS — G4733 Obstructive sleep apnea (adult) (pediatric): Secondary | ICD-10-CM

## 2022-11-24 DIAGNOSIS — E1142 Type 2 diabetes mellitus with diabetic polyneuropathy: Secondary | ICD-10-CM

## 2022-11-24 DIAGNOSIS — E782 Mixed hyperlipidemia: Secondary | ICD-10-CM | POA: Diagnosis not present

## 2022-11-24 DIAGNOSIS — Z89519 Acquired absence of unspecified leg below knee: Secondary | ICD-10-CM

## 2022-11-24 DIAGNOSIS — S98132A Complete traumatic amputation of one left lesser toe, initial encounter: Secondary | ICD-10-CM

## 2022-11-24 DIAGNOSIS — I739 Peripheral vascular disease, unspecified: Secondary | ICD-10-CM | POA: Diagnosis not present

## 2022-11-24 DIAGNOSIS — I509 Heart failure, unspecified: Secondary | ICD-10-CM

## 2022-11-24 NOTE — Patient Instructions (Signed)
Call [employer] to start an intake and start the process to get a new chair [phone]

## 2022-11-24 NOTE — Progress Notes (Addendum)
I,Kennady Zimmerle,acting as a Neurosurgeon for [name], [name].,have documented all relevant documentation on the behalf of [name], [name],as directed by  [name], [name] while in the presence of [name], [name].  Subjective:  Patient ID: Micheal Walls , male    DOB: 04/17/1936 , 87 y.o.   MRN: [id number]  Chief Complaint  Patient presents with   Diabetes    HPI  Patient presents today for a DM and BP, patient reports compliance with medications and has no other concerns today. Patient denies any chest pain, SOB, or headaches.   His power chair is 85 years old this month and he is interested in getting a [employer] vs power wheelchair. He feels like it is a life saver. Right above knee amputation and 2 toes from left foot amputated. He currently has a power wheelchair [id number] w/base captain. High back and arm rest. He is unable to go to the bathroom, prepare meals or groom/dress without the use of a PMD. He is able to shift his weight however has had swelling to his left lower leg.   BP Readings from Last 3 Encounters: 11/24/22 : 130/66 11/08/22 : (!) 146/68 07/25/22 : (!) 142/80    Diabetes He presents for his follow-up diabetic visit. He has type 2 diabetes mellitus. His disease course has been improving. Pertinent negatives for hypoglycemia include no confusion, dizziness, headaches or nervousness/anxiousness. There are no diabetic associated symptoms. Pertinent negatives for diabetes include no fatigue, no polydipsia, no polyphagia and no polyuria. There are no hypoglycemic complications. Symptoms are improving. Diabetic complications include peripheral neuropathy and retinopathy. Pertinent negatives for diabetic complications include no autonomic neuropathy or PVD. Risk factors for coronary artery disease include sedentary lifestyle and obesity. Current diabetic treatment includes oral agent (monotherapy) (no longer on Tresiba since December). He is compliant with treatment all of  the time. His weight is stable. He is following a diabetic diet. When asked about meal planning, he reported none. He has not had a previous visit with a dietitian. He rarely participates in exercise. His home blood glucose trend is decreasing steadily. (Average 110 over last 30 days used freestyle libre) An ACE inhibitor/angiotensin II receptor blocker is being taken. He sees a podiatrist (Dr Donzetta Matters - continues to be followed for his left toe amputation 2nd toe).Eye exam is current (Dr. Luciana Axe 06/24/2021).     Past Medical History:  Diagnosis Date   Acute metabolic encephalopathy 01/16/2020   Amputated below knee [facility])    right   Anemia    Aspiration pneumonia (HCC) 11/02/2019   Diastolic heart failure (HCC)    Diverticulosis    DM (diabetes mellitus) (HCC)    Gout    Hyperlipemia    OSA on CPAP    RBBB    Sepsis (HCC) 11/02/2019   Systemic hypertension      Family History  Problem Relation Age of Onset   Diabetes Mother    Heart attack Father    Cancer Sister      Current Outpatient Medications:    allopurinol (ZYLOPRIM) 100 MG tablet, Take 100 mg by mouth 2 (two) times daily., Disp: , Rfl:    amoxicillin-clavulanate (AUGMENTIN) 875-125 MG tablet, Take 1 tablet by mouth 2 (two) times daily., Disp: 20 tablet, Rfl: 0   aspirin EC 81 MG tablet, Take 81 mg by mouth daily. Swallow whole., Disp: , Rfl:    Castellani Paint 1.5 % LIQD, Apply between toes daily, Disp: 30 mL, Rfl: 0   colchicine  0.6 MG tablet, Take 0.6 mg by mouth once a week., Disp: , Rfl:    Continuous Blood Gluc Sensor (FREESTYLE LIBRE 14 DAY SENSOR) MISC, Use as directed to check blood sugars, Disp: 6 each, Rfl: 1   diltiazem (CARDIZEM CD) 240 MG 24 hr capsule, TAKE ONE CAPSULE BY MOUTH ONCE DAILY, Disp: 90 capsule, Rfl: 3   dorzolamide (TRUSOPT) 2 % ophthalmic solution, Place 1 drop into both eyes 2 (two) times daily., Disp: , Rfl:    ferrous sulfate 325 (65 FE) MG tablet, Take 1 tablet (325 mg total) by mouth 2 (two)  times daily with a meal., Disp: , Rfl: 3   furosemide (LASIX) 40 MG tablet, Take 1 tablet (40 mg total) by mouth 2 (two) times daily., Disp: 180 tablet, Rfl: 1   gabapentin (NEURONTIN) 100 MG capsule, TAKE TWO CAPSULES BY MOUTH EVERY MORNING, TAKE ONE CAPSULE BY MOUTH AT NOON, TAKE ONE CAPSULE BY MOUTH EVERY EVENING, Disp: 360 capsule, Rfl: 1   gentamicin ointment (GARAMYCIN) 0.1 %, Apply 1 Application topically 3 (three) times daily., Disp: 15 g, Rfl: 0   glucose blood (FREESTYLE PRECISION NEO TEST) test strip, Use as instructed, Disp: 100 each, Rfl: 12   ketotifen (ZADITOR) 0.025 % ophthalmic solution, Place 1 drop into both eyes 2 (two) times daily., Disp: , Rfl:    latanoprost (XALATAN) 0.005 % ophthalmic solution, Place 1 drop into both eyes at bedtime. , Disp: , Rfl:    Multiple Vitamins-Minerals (MULTIVITAMIN WITH MINERALS) tablet, Take 1 tablet by mouth daily., Disp: , Rfl:    Multiple Vitamins-Minerals (PRESERVISION AREDS) CAPS, Take 2 capsules by mouth daily., Disp: , Rfl:    pantoprazole (PROTONIX) 40 MG tablet, TAKE ONE TABLET BY MOUTH EVERYDAY AT BEDTIME, Disp: 90 tablet, Rfl: 1   rosuvastatin (CRESTOR) 20 MG tablet, TAKE ONE TABLET BY MOUTH EVERYDAY AT BEDTIME, Disp: 90 tablet, Rfl: 3   Semaglutide, [name], (OZEMPIC, [name],) 2 MG/1.5ML SOPN, Inject 1 mg into the skin once a week., Disp: 9 mL, Rfl: 1   senna-docusate (SENOKOT-S) 8.6-50 MG tablet, Take 1 tablet by mouth 2 (two) times daily between meals as needed for mild constipation or moderate constipation. (Patient taking differently: Take 1 tablet by mouth 2 (two) times daily as needed (constipation).), Disp: 60 tablet, Rfl: 0   SYNTHROID 25 MCG tablet, Take 1 tablet by mouth Monday - Friday, Disp: 60 tablet, Rfl: 2   tamsulosin (FLOMAX) 0.4 MG CAPS capsule, Take 0.4 mg by mouth daily., Disp: , Rfl:    Allergies  Allergen Reactions   Vancomycin Rash     Review of Systems  Constitutional:  Negative for fatigue.   Endocrine: Negative for polydipsia, polyphagia and polyuria.  Musculoskeletal:        Right lower le  Neurological:  Negative for dizziness and headaches.  Psychiatric/Behavioral:  Negative for confusion. The patient is not nervous/anxious.      Today's Vitals   11/24/22 1136  BP: 130/66  Pulse: 85  Temp: 98.5 F (36.9 C)  TempSrc: Oral  SpO2: 98%  Weight: 253 lb (114.8 kg)  Height: 6\' 1"  (1.854 m)  PainSc: 0-No pain   Body mass index is 33.38 kg/m.  Wt Readings from Last 3 Encounters:  11/24/22 253 lb (114.8 kg)  11/08/22 253 lb 3.2 oz (114.9 kg)  07/25/22 222 lb (100.7 kg)    The ASCVD Risk score (Arnett DK, et al., 2019) failed to calculate for the following reasons:   The 2019 ASCVD  risk score is only valid for ages 56 to 12   The patient has a prior MI or stroke diagnosis  Objective:  Physical Exam Vitals reviewed.  Constitutional:      General: He is not in acute distress.    Appearance: Normal appearance. He is obese.  Cardiovascular:     Rate and Rhythm: Normal rate and regular rhythm.     Pulses: Normal pulses.     Heart sounds: Normal heart sounds. No murmur heard. Pulmonary:     Effort: Pulmonary effort is normal. No respiratory distress.     Breath sounds: Normal breath sounds. No wheezing.  Musculoskeletal:     Right upper leg: Normal.     Left upper leg: Normal.     Left lower leg: Swelling present.     Left ankle: Decreased range of motion.     Comments: RLE 1/5, LLE 2/5. RUE 4/5 and LUE 4/5. Negative pain. Left foot with great toe and 2nd toe amputation     Right Lower Extremity: Right leg is amputated below knee.  Skin:    General: Skin is warm and dry.     Capillary Refill: Capillary refill takes less than 2 seconds.     Comments: Left amputated toe stump healing well.   Neurological:     General: No focal deficit present.     Mental Status: He is alert and oriented to person, place, and time.     Cranial Nerves: Cranial nerves 2-12 are  intact. No cranial nerve deficit.     Motor: No weakness.     Gait: Gait abnormal (due to Right BKA and Left toes amputation, he does not ambulate uses motorized device for ambulation).  Psychiatric:        Mood and Affect: Mood normal.        Behavior: Behavior normal.        Thought Content: Thought content normal.        Judgment: Judgment normal.         Assessment And Plan:  1. Type 2 diabetes mellitus with other circulatory complication, with long-term current use of insulin (HCC) Comments: HgbA1c is improving, continue current medications. - Hemoglobin A1c - Microalbumin / Creatinine Urine Ratio - TSH + free T4  2. 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 PAD (peripheral artery disease) (HCC) Comments: Continue statin.  4. Mixed hyperlipidemia Comments: Cholesterol levels are stable, continue statin, tolerating well. - Lipid panel  5. Proliferative diabetic retinopathy of right eye associated with type 2 diabetes mellitus, macular edema presence unspecified (HCC) Comments: Continue f/u with Opthalmology  6. S/P unilateral BKA (below knee amputation) (HCC) Comments: A PMD is necessary for him to go to the bathroom/bathe, groom and dress. He has weakness to RLE 1/5, LLE 2/5. RUE 4/5 and LUE 4/5. He has been utilizing a motorized wheelchair for at least 8 years, he is unable to use a MWV due to not being able to propel himself with his legs and does not have adequate strength in his arms to roll a standard wheelchair RUE 4/5 and LUE 4/5 and grip strength is 3/5. He is unable to use a cane due to instability , poor balance with BKA. He is unable to use a POV due to lack of postural stability. He can safely operate the power mobility device both mentally and physically. He is willing and motivated to use the power mobility device  in the home.  Addendum: he is unable to ambulate  with his prosthesis due to unstable gait and has toes amputated to left foot.   7. Amputation of toe of left foot (HCC) Comments: Healing well, continue f/u with podiatrist. He has difficulty with standing without the use of a PMD.  8. OSA on CPAP Comments: Will send another order through [employer] for new machine  9. Class 1 obesity due to excess calories without serious comorbidity with body mass index (BMI) of 33.0 to 33.9 in adult She is encouraged to strive for BMI less than 30 to decrease cardiac risk. Advised to aim for at least 150 minutes of exercise per week.   Return for controlled DM check 4 months.  Patient was given opportunity to ask questions. Patient verbalized understanding of the plan and was able to repeat key elements of the plan. All questions were answered to their satisfaction.  [name], [name]  I, [name], [name], have reviewed all documentation for this visit. The documentation on 11/24/22 for the exam, diagnosis, procedures, and orders are all accurate and complete.   IF YOU HAVE BEEN REFERRED TO A SPECIALIST, IT MAY TAKE 1-2 WEEKS TO SCHEDULE/PROCESS THE REFERRAL. IF YOU HAVE NOT HEARD FROM US/SPECIALIST IN TWO WEEKS, PLEASE GIVE [place] A CALL AT [phone].

## 2022-11-25 LAB — LIPID PANEL
Chol/HDL Ratio: 3 ratio (ref 0.0–5.0)
Cholesterol, Total: 103 mg/dL (ref 100–199)
HDL: 34 mg/dL — ABNORMAL LOW (ref >39)
LDL Chol Calc (NIH): 48 mg/dL (ref 0–99)
Triglycerides: 116 mg/dL (ref 0–149)
VLDL Cholesterol Cal: 21 mg/dL (ref 5–40)

## 2022-11-25 LAB — MICROALBUMIN / CREATININE URINE RATIO
Creatinine, Urine: 37 mg/dL
Microalb/Creat Ratio: 771 mg/g creat — ABNORMAL HIGH (ref 0–29)
Microalbumin, Urine: 285.1 ug/mL

## 2022-11-25 LAB — HEMOGLOBIN A1C
Est. average glucose Bld gHb Est-mCnc: 134 mg/dL
Hgb A1c MFr Bld: 6.3 % — ABNORMAL HIGH (ref 4.8–5.6)

## 2022-11-25 LAB — TSH+FREE T4
Free T4: 1.03 ng/dL (ref 0.82–1.77)
TSH: 3.89 u[IU]/mL (ref 0.450–4.500)

## 2022-11-30 ENCOUNTER — Ambulatory Visit: Payer: Medicare Other | Admitting: Nurse Practitioner

## 2022-12-07 DIAGNOSIS — E103593 Type 1 diabetes mellitus with proliferative diabetic retinopathy without macular edema, bilateral: Secondary | ICD-10-CM | POA: Diagnosis not present

## 2022-12-07 DIAGNOSIS — H401132 Primary open-angle glaucoma, bilateral, moderate stage: Secondary | ICD-10-CM | POA: Diagnosis not present

## 2022-12-07 DIAGNOSIS — H35372 Puckering of macula, left eye: Secondary | ICD-10-CM | POA: Diagnosis not present

## 2022-12-07 DIAGNOSIS — H353132 Nonexudative age-related macular degeneration, bilateral, intermediate dry stage: Secondary | ICD-10-CM | POA: Diagnosis not present

## 2022-12-13 DIAGNOSIS — E6609 Other obesity due to excess calories: Secondary | ICD-10-CM | POA: Insufficient documentation

## 2022-12-13 DIAGNOSIS — S98132A Complete traumatic amputation of one left lesser toe, initial encounter: Secondary | ICD-10-CM | POA: Insufficient documentation

## 2022-12-13 DIAGNOSIS — I130 Hypertensive heart and chronic kidney disease with heart failure and stage 1 through stage 4 chronic kidney disease, or unspecified chronic kidney disease: Secondary | ICD-10-CM | POA: Insufficient documentation

## 2022-12-20 DIAGNOSIS — H35372 Puckering of macula, left eye: Secondary | ICD-10-CM | POA: Diagnosis not present

## 2022-12-20 DIAGNOSIS — E113591 Type 2 diabetes mellitus with proliferative diabetic retinopathy without macular edema, right eye: Secondary | ICD-10-CM | POA: Diagnosis not present

## 2022-12-20 DIAGNOSIS — H353113 Nonexudative age-related macular degeneration, right eye, advanced atrophic without subfoveal involvement: Secondary | ICD-10-CM | POA: Diagnosis not present

## 2022-12-20 DIAGNOSIS — H353122 Nonexudative age-related macular degeneration, left eye, intermediate dry stage: Secondary | ICD-10-CM | POA: Diagnosis not present

## 2022-12-20 DIAGNOSIS — E113592 Type 2 diabetes mellitus with proliferative diabetic retinopathy without macular edema, left eye: Secondary | ICD-10-CM | POA: Diagnosis not present

## 2022-12-28 ENCOUNTER — Other Ambulatory Visit: Payer: Self-pay

## 2022-12-28 ENCOUNTER — Encounter: Payer: Self-pay | Admitting: Nurse Practitioner

## 2022-12-28 DIAGNOSIS — Z794 Long term (current) use of insulin: Secondary | ICD-10-CM

## 2022-12-28 MED ORDER — OZEMPIC (1 MG/DOSE) 2 MG/1.5ML ~~LOC~~ SOPN
1.0000 mg | PEN_INJECTOR | SUBCUTANEOUS | 1 refills | Status: DC
Start: 2022-12-28 — End: 2023-01-30

## 2023-01-03 DIAGNOSIS — Z6841 Body Mass Index (BMI) 40.0 and over, adult: Secondary | ICD-10-CM | POA: Diagnosis not present

## 2023-01-03 DIAGNOSIS — E785 Hyperlipidemia, unspecified: Secondary | ICD-10-CM | POA: Diagnosis not present

## 2023-01-03 DIAGNOSIS — M109 Gout, unspecified: Secondary | ICD-10-CM | POA: Diagnosis not present

## 2023-01-03 DIAGNOSIS — N3289 Other specified disorders of bladder: Secondary | ICD-10-CM | POA: Diagnosis not present

## 2023-01-03 DIAGNOSIS — E118 Type 2 diabetes mellitus with unspecified complications: Secondary | ICD-10-CM | POA: Diagnosis not present

## 2023-01-03 DIAGNOSIS — I129 Hypertensive chronic kidney disease with stage 1 through stage 4 chronic kidney disease, or unspecified chronic kidney disease: Secondary | ICD-10-CM | POA: Diagnosis not present

## 2023-01-03 DIAGNOSIS — N183 Chronic kidney disease, stage 3 unspecified: Secondary | ICD-10-CM | POA: Diagnosis not present

## 2023-01-05 ENCOUNTER — Other Ambulatory Visit: Payer: Self-pay | Admitting: Cardiovascular Disease

## 2023-01-27 ENCOUNTER — Other Ambulatory Visit: Payer: Self-pay | Admitting: Podiatry

## 2023-01-30 ENCOUNTER — Encounter: Payer: Self-pay | Admitting: Nurse Practitioner

## 2023-01-30 ENCOUNTER — Other Ambulatory Visit: Payer: Self-pay

## 2023-01-30 DIAGNOSIS — Z794 Long term (current) use of insulin: Secondary | ICD-10-CM

## 2023-01-30 MED ORDER — FREESTYLE LIBRE 14 DAY SENSOR MISC: 1 refills | Status: DC

## 2023-01-30 MED ORDER — OZEMPIC (1 MG/DOSE) 2 MG/1.5ML ~~LOC~~ SOPN
1.0000 mg | PEN_INJECTOR | SUBCUTANEOUS | 1 refills | Status: DC
Start: 2023-01-30 — End: 2023-03-27

## 2023-02-05 ENCOUNTER — Other Ambulatory Visit: Payer: Self-pay

## 2023-02-05 ENCOUNTER — Inpatient Hospital Stay (HOSPITAL_COMMUNITY)
Admission: EM | Admit: 2023-02-05 | Discharge: 2023-02-07 | DRG: 617 | Disposition: A | Discharge status: Home / Self Care | Payer: Medicare Other | Attending: Family Medicine | Admitting: Family Medicine

## 2023-02-05 ENCOUNTER — Emergency Department (HOSPITAL_COMMUNITY): Payer: Medicare Other

## 2023-02-05 ENCOUNTER — Inpatient Hospital Stay (HOSPITAL_COMMUNITY): Payer: Medicare Other

## 2023-02-05 ENCOUNTER — Encounter (HOSPITAL_COMMUNITY): Payer: Self-pay

## 2023-02-05 DIAGNOSIS — Z89511 Acquired absence of right leg below knee: Secondary | ICD-10-CM | POA: Diagnosis not present

## 2023-02-05 DIAGNOSIS — Z87891 Personal history of nicotine dependence: Secondary | ICD-10-CM | POA: Diagnosis not present

## 2023-02-05 DIAGNOSIS — Z7982 Long term (current) use of aspirin: Secondary | ICD-10-CM | POA: Diagnosis not present

## 2023-02-05 DIAGNOSIS — M109 Gout, unspecified: Secondary | ICD-10-CM | POA: Diagnosis present

## 2023-02-05 DIAGNOSIS — E1122 Type 2 diabetes mellitus with diabetic chronic kidney disease: Secondary | ICD-10-CM | POA: Diagnosis present

## 2023-02-05 DIAGNOSIS — E039 Hypothyroidism, unspecified: Secondary | ICD-10-CM | POA: Diagnosis not present

## 2023-02-05 DIAGNOSIS — L089 Local infection of the skin and subcutaneous tissue, unspecified: Secondary | ICD-10-CM | POA: Diagnosis not present

## 2023-02-05 DIAGNOSIS — L929 Granulomatous disorder of the skin and subcutaneous tissue, unspecified: Secondary | ICD-10-CM | POA: Diagnosis not present

## 2023-02-05 DIAGNOSIS — N183 Chronic kidney disease, stage 3 unspecified: Secondary | ICD-10-CM | POA: Diagnosis present

## 2023-02-05 DIAGNOSIS — Z79890 Hormone replacement therapy (postmenopausal): Secondary | ICD-10-CM | POA: Diagnosis not present

## 2023-02-05 DIAGNOSIS — R600 Localized edema: Secondary | ICD-10-CM | POA: Diagnosis not present

## 2023-02-05 DIAGNOSIS — G4733 Obstructive sleep apnea (adult) (pediatric): Secondary | ICD-10-CM | POA: Diagnosis not present

## 2023-02-05 DIAGNOSIS — I10 Essential (primary) hypertension: Secondary | ICD-10-CM | POA: Diagnosis present

## 2023-02-05 DIAGNOSIS — E785 Hyperlipidemia, unspecified: Secondary | ICD-10-CM | POA: Diagnosis present

## 2023-02-05 DIAGNOSIS — Z833 Family history of diabetes mellitus: Secondary | ICD-10-CM

## 2023-02-05 DIAGNOSIS — N1832 Chronic kidney disease, stage 3b: Secondary | ICD-10-CM | POA: Diagnosis not present

## 2023-02-05 DIAGNOSIS — Z7985 Long-term (current) use of injectable non-insulin antidiabetic drugs: Secondary | ICD-10-CM | POA: Diagnosis not present

## 2023-02-05 DIAGNOSIS — D631 Anemia in chronic kidney disease: Secondary | ICD-10-CM | POA: Diagnosis present

## 2023-02-05 DIAGNOSIS — E669 Obesity, unspecified: Secondary | ICD-10-CM | POA: Diagnosis present

## 2023-02-05 DIAGNOSIS — H409 Unspecified glaucoma: Secondary | ICD-10-CM | POA: Diagnosis not present

## 2023-02-05 DIAGNOSIS — M86172 Other acute osteomyelitis, left ankle and foot: Secondary | ICD-10-CM | POA: Diagnosis present

## 2023-02-05 DIAGNOSIS — Z8673 Personal history of transient ischemic attack (TIA), and cerebral infarction without residual deficits: Secondary | ICD-10-CM | POA: Diagnosis not present

## 2023-02-05 DIAGNOSIS — N179 Acute kidney failure, unspecified: Secondary | ICD-10-CM | POA: Diagnosis present

## 2023-02-05 DIAGNOSIS — I130 Hypertensive heart and chronic kidney disease with heart failure and stage 1 through stage 4 chronic kidney disease, or unspecified chronic kidney disease: Secondary | ICD-10-CM | POA: Diagnosis present

## 2023-02-05 DIAGNOSIS — Z8249 Family history of ischemic heart disease and other diseases of the circulatory system: Secondary | ICD-10-CM

## 2023-02-05 DIAGNOSIS — Z6832 Body mass index (BMI) 32.0-32.9, adult: Secondary | ICD-10-CM

## 2023-02-05 DIAGNOSIS — I7090 Unspecified atherosclerosis: Secondary | ICD-10-CM | POA: Diagnosis not present

## 2023-02-05 DIAGNOSIS — Z8679 Personal history of other diseases of the circulatory system: Secondary | ICD-10-CM | POA: Diagnosis not present

## 2023-02-05 DIAGNOSIS — M79675 Pain in left toe(s): Secondary | ICD-10-CM | POA: Diagnosis not present

## 2023-02-05 DIAGNOSIS — D649 Anemia, unspecified: Secondary | ICD-10-CM | POA: Diagnosis present

## 2023-02-05 DIAGNOSIS — I739 Peripheral vascular disease, unspecified: Secondary | ICD-10-CM

## 2023-02-05 DIAGNOSIS — L03116 Cellulitis of left lower limb: Secondary | ICD-10-CM | POA: Diagnosis present

## 2023-02-05 DIAGNOSIS — N400 Enlarged prostate without lower urinary tract symptoms: Secondary | ICD-10-CM | POA: Diagnosis present

## 2023-02-05 DIAGNOSIS — E1169 Type 2 diabetes mellitus with other specified complication: Secondary | ICD-10-CM | POA: Diagnosis not present

## 2023-02-05 DIAGNOSIS — Z7984 Long term (current) use of oral hypoglycemic drugs: Secondary | ICD-10-CM

## 2023-02-05 DIAGNOSIS — Z9889 Other specified postprocedural states: Secondary | ICD-10-CM | POA: Diagnosis not present

## 2023-02-05 DIAGNOSIS — Z79899 Other long term (current) drug therapy: Secondary | ICD-10-CM | POA: Diagnosis not present

## 2023-02-05 DIAGNOSIS — E1151 Type 2 diabetes mellitus with diabetic peripheral angiopathy without gangrene: Secondary | ICD-10-CM | POA: Diagnosis present

## 2023-02-05 DIAGNOSIS — I5032 Chronic diastolic (congestive) heart failure: Secondary | ICD-10-CM | POA: Diagnosis present

## 2023-02-05 DIAGNOSIS — Z881 Allergy status to other antibiotic agents status: Secondary | ICD-10-CM | POA: Diagnosis not present

## 2023-02-05 DIAGNOSIS — E11628 Type 2 diabetes mellitus with other skin complications: Secondary | ICD-10-CM | POA: Diagnosis present

## 2023-02-05 DIAGNOSIS — M869 Osteomyelitis, unspecified: Principal | ICD-10-CM

## 2023-02-05 DIAGNOSIS — M7732 Calcaneal spur, left foot: Secondary | ICD-10-CM | POA: Diagnosis not present

## 2023-02-05 DIAGNOSIS — R9431 Abnormal electrocardiogram [ECG] [EKG]: Secondary | ICD-10-CM | POA: Diagnosis not present

## 2023-02-05 LAB — COMPREHENSIVE METABOLIC PANEL
ALT: 32 U/L (ref 0–44)
AST: 34 U/L (ref 15–41)
Albumin: 3.2 g/dL — ABNORMAL LOW (ref 3.5–5.0)
Alkaline Phosphatase: 72 U/L (ref 38–126)
Anion gap: 13 (ref 5–15)
BUN: 70 mg/dL — ABNORMAL HIGH (ref 8–23)
CO2: 23 mmol/L (ref 22–32)
Calcium: 9.5 mg/dL (ref 8.9–10.3)
Chloride: 100 mmol/L (ref 98–111)
Creatinine, Ser: 2.23 mg/dL — ABNORMAL HIGH (ref 0.61–1.24)
GFR, Estimated: 28 mL/min — ABNORMAL LOW (ref >60)
Glucose, Bld: 148 mg/dL — ABNORMAL HIGH (ref 70–99)
Potassium: 4.1 mmol/L (ref 3.5–5.1)
Sodium: 136 mmol/L (ref 135–145)
Total Bilirubin: 0.4 mg/dL (ref 0.3–1.2)
Total Protein: 7.4 g/dL (ref 6.5–8.1)

## 2023-02-05 LAB — CBC WITH DIFFERENTIAL/PLATELET
Abs Immature Granulocytes: 0.02 10*3/uL (ref 0.00–0.07)
Basophils Absolute: 0 10*3/uL (ref 0.0–0.1)
Basophils Relative: 0 %
Eosinophils Absolute: 0.2 10*3/uL (ref 0.0–0.5)
Eosinophils Relative: 4 %
HCT: 31.2 % — ABNORMAL LOW (ref 39.0–52.0)
Hemoglobin: 10.4 g/dL — ABNORMAL LOW (ref 13.0–17.0)
Immature Granulocytes: 0 %
Lymphocytes Relative: 24 %
Lymphs Abs: 1.3 10*3/uL (ref 0.7–4.0)
MCH: 30.1 pg (ref 26.0–34.0)
MCHC: 33.3 g/dL (ref 30.0–36.0)
MCV: 90.2 fL (ref 80.0–100.0)
Monocytes Absolute: 0.4 10*3/uL (ref 0.1–1.0)
Monocytes Relative: 8 %
Neutro Abs: 3.4 10*3/uL (ref 1.7–7.7)
Neutrophils Relative %: 64 %
Platelets: 201 10*3/uL (ref 150–400)
RBC: 3.46 MIL/uL — ABNORMAL LOW (ref 4.22–5.81)
RDW: 15.1 % (ref 11.5–15.5)
WBC: 5.3 10*3/uL (ref 4.0–10.5)
nRBC: 0 % (ref 0.0–0.2)

## 2023-02-05 LAB — GLUCOSE, CAPILLARY
Glucose-Capillary: 109 mg/dL — ABNORMAL HIGH (ref 70–99)
Glucose-Capillary: 102 mg/dL — ABNORMAL HIGH (ref 70–99)

## 2023-02-05 LAB — HEMOGLOBIN A1C
Hgb A1c MFr Bld: 6.3 % — ABNORMAL HIGH (ref 4.8–5.6)
Mean Plasma Glucose: 134.11 mg/dL

## 2023-02-05 LAB — C-REACTIVE PROTEIN: CRP: 3.7 mg/dL — ABNORMAL HIGH (ref <1.0)

## 2023-02-05 LAB — VAS US ABI WITH/WO TBI: Left ABI: 1.62

## 2023-02-05 LAB — SEDIMENTATION RATE: Sed Rate: 70 mm/hr — ABNORMAL HIGH (ref 0–16)

## 2023-02-05 LAB — I-STAT CG4 LACTIC ACID, ED: Lactic Acid, Venous: 0.9 mmol/L (ref 0.5–1.9)

## 2023-02-05 MED ORDER — HYDROMORPHONE HCL 1 MG/ML IJ SOLN: 0.5000 mg | INTRAMUSCULAR | Status: DC | PRN

## 2023-02-05 MED ORDER — PANTOPRAZOLE SODIUM 40 MG PO TBEC
40.0000 mg | DELAYED_RELEASE_TABLET | Freq: Every day | ORAL | Status: DC
  Administered 2023-02-05 – 2023-02-07 (×3): 40 mg via ORAL
  Filled 2023-02-05 (×3): qty 1

## 2023-02-05 MED ORDER — SODIUM CHLORIDE 0.9 % IV SOLN
2.0000 g | INTRAVENOUS | Status: DC
  Administered 2023-02-05 – 2023-02-07 (×3): 2 g via INTRAVENOUS
  Filled 2023-02-05 (×3): qty 20

## 2023-02-05 MED ORDER — DORZOLAMIDE HCL 2 % OP SOLN
1.0000 [drp] | Freq: Two times a day (BID) | OPHTHALMIC | Status: DC
  Administered 2023-02-05 – 2023-02-07 (×4): 1 [drp] via OPHTHALMIC
  Filled 2023-02-05: qty 10

## 2023-02-05 MED ORDER — ONDANSETRON HCL 4 MG/2ML IJ SOLN: 4.0000 mg | Freq: Four times a day (QID) | INTRAMUSCULAR | Status: DC | PRN

## 2023-02-05 MED ORDER — ONDANSETRON HCL 4 MG PO TABS: 4.0000 mg | ORAL_TABLET | Freq: Four times a day (QID) | ORAL | Status: DC | PRN

## 2023-02-05 MED ORDER — FERROUS SULFATE 325 (65 FE) MG PO TABS
325.0000 mg | ORAL_TABLET | Freq: Two times a day (BID) | ORAL | Status: DC
  Administered 2023-02-06 – 2023-02-07 (×3): 325 mg via ORAL
  Filled 2023-02-05 (×3): qty 1

## 2023-02-05 MED ORDER — INSULIN ASPART 100 UNIT/ML IJ SOLN: 0.0000 [IU] | Freq: Every day | INTRAMUSCULAR | Status: DC

## 2023-02-05 MED ORDER — GABAPENTIN 100 MG PO CAPS
100.0000 mg | ORAL_CAPSULE | Freq: Two times a day (BID) | ORAL | Status: DC
  Administered 2023-02-05 – 2023-02-07 (×4): 100 mg via ORAL
  Filled 2023-02-05 (×4): qty 1

## 2023-02-05 MED ORDER — TAMSULOSIN HCL 0.4 MG PO CAPS
0.4000 mg | ORAL_CAPSULE | Freq: Every day | ORAL | Status: DC
  Administered 2023-02-05 – 2023-02-07 (×3): 0.4 mg via ORAL
  Filled 2023-02-05 (×3): qty 1

## 2023-02-05 MED ORDER — ASPIRIN 81 MG PO TBEC
81.0000 mg | DELAYED_RELEASE_TABLET | Freq: Every day | ORAL | Status: DC
  Administered 2023-02-05 – 2023-02-07 (×3): 81 mg via ORAL
  Filled 2023-02-05 (×3): qty 1

## 2023-02-05 MED ORDER — LEVOTHYROXINE SODIUM 25 MCG PO TABS
25.0000 ug | ORAL_TABLET | Freq: Every day | ORAL | Status: DC
  Administered 2023-02-06: 25 ug via ORAL
  Filled 2023-02-05 (×2): qty 1

## 2023-02-05 MED ORDER — ENOXAPARIN SODIUM 40 MG/0.4ML IJ SOSY
40.0000 mg | PREFILLED_SYRINGE | INTRAMUSCULAR | Status: DC
  Administered 2023-02-05 – 2023-02-06 (×2): 40 mg via SUBCUTANEOUS
  Filled 2023-02-05 (×2): qty 0.4

## 2023-02-05 MED ORDER — METRONIDAZOLE 500 MG/100ML IV SOLN
500.0000 mg | Freq: Two times a day (BID) | INTRAVENOUS | Status: DC
  Administered 2023-02-05 – 2023-02-07 (×5): 500 mg via INTRAVENOUS
  Filled 2023-02-05 (×5): qty 100

## 2023-02-05 MED ORDER — LATANOPROST 0.005 % OP SOLN
1.0000 [drp] | Freq: Every day | OPHTHALMIC | Status: DC
  Administered 2023-02-05 – 2023-02-06 (×2): 1 [drp] via OPHTHALMIC
  Filled 2023-02-05: qty 2.5

## 2023-02-05 MED ORDER — DILTIAZEM HCL ER COATED BEADS 120 MG PO CP24
240.0000 mg | ORAL_CAPSULE | Freq: Every day | ORAL | Status: DC
  Administered 2023-02-05 – 2023-02-07 (×3): 240 mg via ORAL
  Filled 2023-02-05 (×3): qty 2

## 2023-02-05 MED ORDER — ACETAMINOPHEN 650 MG RE SUPP: 650.0000 mg | Freq: Four times a day (QID) | RECTAL | Status: DC | PRN

## 2023-02-05 MED ORDER — ALLOPURINOL 100 MG PO TABS
100.0000 mg | ORAL_TABLET | Freq: Two times a day (BID) | ORAL | Status: DC
  Administered 2023-02-05 – 2023-02-07 (×4): 100 mg via ORAL
  Filled 2023-02-05 (×4): qty 1

## 2023-02-05 MED ORDER — FUROSEMIDE 40 MG PO TABS
40.0000 mg | ORAL_TABLET | Freq: Two times a day (BID) | ORAL | Status: DC
  Administered 2023-02-05 – 2023-02-07 (×4): 40 mg via ORAL
  Filled 2023-02-05 (×4): qty 1

## 2023-02-05 MED ORDER — COLCHICINE 0.6 MG PO TABS
0.6000 mg | ORAL_TABLET | ORAL | Status: DC
  Administered 2023-02-05: 0.6 mg via ORAL
  Filled 2023-02-05: qty 1

## 2023-02-05 MED ORDER — INSULIN ASPART 100 UNIT/ML IJ SOLN
0.0000 [IU] | Freq: Three times a day (TID) | INTRAMUSCULAR | Status: DC
  Administered 2023-02-06: 1 [IU] via SUBCUTANEOUS

## 2023-02-05 MED ORDER — ACETAMINOPHEN 325 MG PO TABS
650.0000 mg | ORAL_TABLET | Freq: Four times a day (QID) | ORAL | Status: DC | PRN
  Administered 2023-02-05: 650 mg via ORAL
  Filled 2023-02-05: qty 2

## 2023-02-05 NOTE — ED Triage Notes (Addendum)
Pt hit 3rd toe left foot on Wednesday. Had some pus and pain when he is standing on it, edema to the lower left leg.   Hx chf and dm

## 2023-02-05 NOTE — ED Notes (Signed)
Pt transported to Vascular 

## 2023-02-05 NOTE — Plan of Care (Signed)

## 2023-02-05 NOTE — ED Notes (Signed)
ED TO INPATIENT HANDOFF REPORT  ED Nurse Name and Phone #: Gregorio Worley [phone]  S Name/Age/Gender Micheal Walls 87 y.o. male Room/Bed: 019C/019C  Code Status   Code Status: Prior  Home/SNF/Other Home Patient oriented to: self, place, time, and situation Is this baseline? Yes   Triage Complete: Triage complete  Chief Complaint Acute osteomyelitis of left foot (HCC) [M86.172]  Triage Note Pt hit 3rd toe left foot on Wednesday. Had some pus and pain when he is standing on it, edema to the lower left leg.   Hx chf and dm    Allergies Allergies  Allergen Reactions   Vancomycin Rash    Level of Care/Admitting Diagnosis ED Disposition     ED Disposition  Admit   Condition  --   Comment  Hospital Area: [facility] [[id number]]  Level of Care: Med-Surg [16]  May admit patient to [facility] or [facility] if equivalent level of care is available:: No  Covid Evaluation: Asymptomatic - no recent exposure (last 10 days) testing not required  Diagnosis: Acute osteomyelitis of left foot [facility]) [[id number]]  Admitting Physician: Almon Hercules [[id number]]  Attending Physician: Almon Hercules [[id number]]  Certification:: I certify this patient will need inpatient services for at least 2 midnights  Expected Medical Readiness: 02/07/2023          B Medical/Surgery History Past Medical History:  Diagnosis Date   Acute metabolic encephalopathy 01/16/2020   Amputated below knee (HCC)    right   Anemia    Aspiration pneumonia (HCC) 11/02/2019   Diastolic heart failure (HCC)    Diverticulosis    DM (diabetes mellitus) (HCC)    Gout    Hyperlipemia    OSA on CPAP    RBBB    Sepsis (HCC) 11/02/2019   Systemic hypertension    Past Surgical History:  Procedure Laterality Date   ABDOMINAL AORTOGRAM W/LOWER EXTREMITY Left 08/11/2020   Procedure: ABDOMINAL AORTOGRAM W/LOWER EXTREMITY;  Surgeon: Nada Libman, MD;  [facility];  Service: Cardiovascular;   Laterality: Left;   ABDOMINAL AORTOGRAM W/LOWER EXTREMITY N/A 02/26/2021   Procedure: ABDOMINAL AORTOGRAM W/LOWER EXTREMITY;  Surgeon: Leonie Douglas, MD;  [facility];  Service: Cardiovascular;  Laterality: N/A;   AMPUTATION Left 09/14/2020   Procedure: AMPUTATION LEFT GREAT TOE;  Surgeon: Edwin Cap, DPM;  Location: [facility];  Service: Podiatry;  Laterality: Left;   AMPUTATION TOE Left 12/03/2021   Procedure: AMPUTATION TOE, LEFT second;  Surgeon: Louann Sjogren, DPM;  Location: [facility];  Service: Podiatry;  Laterality: Left;   LEG AMPUTATION BELOW KNEE     right   NM MYOCAR PERF WALL MOTION  09/18/2009   no ischemia   PERIPHERAL VASCULAR BALLOON ANGIOPLASTY Left 08/11/2020   Procedure: PERIPHERAL VASCULAR BALLOON ANGIOPLASTY;  Surgeon: Nada Libman, MD;  [facility];  Service: Cardiovascular;  Laterality: Left;  AT   PERIPHERAL VASCULAR INTERVENTION Left 02/26/2021   Procedure: PERIPHERAL VASCULAR INTERVENTION;  Surgeon: Leonie Douglas, MD;  [facility];  Service: Cardiovascular;  Laterality: Left;     A IV Location/Drains/Wounds Patient Lines/Drains/Airways Status     Active Line/Drains/Airways     Name Placement date Placement time Site Days   Peripheral IV 02/05/23 18 G Anterior;Distal;Right;Upper Arm 02/05/23  1247  Arm  less than 1   Wound / Incision (Open or Dehisced) 11/02/19 Non-pressure wound Toe (Comment  which one) Anterior 11/02/19  2341  Toe (Comment  which one)  1191   Wound / Incision (Open or Dehisced) 07/19/20 Toe (Comment  which one) Anterior;Left 07/19/20  2000  Toe (Comment  which one)  931   Wound / Incision (Open or Dehisced) 09/12/20 Diabetic ulcer Toe (Comment  which one) Left necrotic left great toe 09/12/20  0035  Toe (Comment  which one)  876            Intake/Output Last 24 hours No intake or output data in the 24 hours ending 02/05/23 1555  Labs/Imaging Results for orders placed or performed  during the hospital encounter of 02/05/23 (from the past 48 hour(s))  Comprehensive metabolic panel     Status: Abnormal   Collection Time: 02/05/23 12:44 PM  Result Value Ref Range   Sodium 136 135 - 145 mmol/L   Potassium 4.1 3.5 - 5.1 mmol/L   Chloride 100 98 - 111 mmol/L   CO2 23 22 - 32 mmol/L   Glucose, Bld 148 (H) 70 - 99 mg/dL    Comment: Glucose reference range applies only to samples taken after fasting for at least 8 hours.   BUN 70 (H) 8 - 23 mg/dL   Creatinine, Ser 4.54 (H) 0.61 - 1.24 mg/dL   Calcium 9.5 8.9 - 09.8 mg/dL   Total Protein 7.4 6.5 - 8.1 g/dL   Albumin 3.2 (L) 3.5 - 5.0 g/dL   AST 34 15 - 41 U/L   ALT 32 0 - 44 U/L   Alkaline Phosphatase 72 38 - 126 U/L   Total Bilirubin 0.4 0.3 - 1.2 mg/dL   GFR, Estimated 28 (L) >60 mL/min    Comment: (NOTE) Calculated using the CKD-EPI Creatinine Equation (2021)    Anion gap 13 5 - 15    Comment: Performed at [facility], [address], [place], [place] [address]  CBC with Differential     Status: Abnormal   Collection Time: 02/05/23 12:44 PM  Result Value Ref Range   WBC 5.3 4.0 - 10.5 K/uL   RBC 3.46 (L) 4.22 - 5.81 MIL/uL   Hemoglobin 10.4 (L) 13.0 - 17.0 g/dL   HCT 78.2 (L) 95.6 - 21.3 %   MCV 90.2 80.0 - 100.0 fL   MCH 30.1 26.0 - 34.0 pg   MCHC 33.3 30.0 - 36.0 g/dL   RDW 08.6 57.8 - 46.9 %   Platelets 201 150 - 400 K/uL   nRBC 0.0 0.0 - 0.2 %   Neutrophils Relative % 64 %   Neutro Abs 3.4 1.7 - 7.7 K/uL   Lymphocytes Relative 24 %   Lymphs Abs 1.3 0.7 - 4.0 K/uL   Monocytes Relative 8 %   Monocytes Absolute 0.4 0.1 - 1.0 K/uL   Eosinophils Relative 4 %   Eosinophils Absolute 0.2 0.0 - 0.5 K/uL   Basophils Relative 0 %   Basophils Absolute 0.0 0.0 - 0.1 K/uL   Immature Granulocytes 0 %   Abs Immature Granulocytes 0.02 0.00 - 0.07 K/uL    Comment: Performed at [facility], [address], [place], [place] [address]  I-Stat Lactic Acid, ED     Status: None   Collection Time: 02/05/23  12:51 PM  Result Value Ref Range   Lactic Acid, Venous 0.9 0.5 - 1.9 mmol/L   DG Foot Complete Left  Result Date: 02/05/2023 CLINICAL DATA:  Purulent drainage and pain in the left third toe region. EXAM: LEFT FOOT - COMPLETE 3+ VIEW  COMPARISON:  12/04/2021 FINDINGS: Prior amputation of the first and second toes. Interval bony destruction of the distal and middle phalanges of the third toe compatible with active osteomyelitis. The distal phalanx is completely demineralized and the middle phalanx is partially demineralized. Extensive atheromatous vascular calcification. Midfoot spurring and spurring at the Lisfranc joint. Large plantar calcaneal spur. Subcutaneous edema circumferentially at the ankle and tracking in the dorsum of the foot. IMPRESSION: 1. Interval bony destructive findings of the distal and middle phalanges of the third toe compatible with active osteomyelitis. 2. Prior amputation of the first and second toes. 3. Extensive atheromatous vascular calcification. 4. Subcutaneous edema circumferentially at the ankle and tracking in the dorsum of the foot. Electronically Signed   By: Gaylyn Rong M.D.   On: 02/05/2023 14:31    Pending Labs Unresulted Labs (From admission, onward)     Start     Ordered   02/05/23 1536  C-reactive protein  Once,   R        02/05/23 1535   02/05/23 1536  Sedimentation rate  Once,   R        02/05/23 1535   02/05/23 1526  Blood Cultures x 2 sites  BLOOD CULTURE X 2,   STAT      02/05/23 1526            Vitals/Pain Today's Vitals   02/05/23 1229 02/05/23 1232 02/05/23 1515 02/05/23 1530  BP:  (!) 133/53 138/60 (!) 135/57  Pulse:  71 71 70  Resp:  18 15 19   Temp:  97.7 F (36.5 C)    TempSrc:  Oral    SpO2:  96% 94% 96%  Weight: 250 lb (113.4 kg)     Height: 6\' 1"  (1.854 m)     PainSc: 0-No pain       Isolation Precautions No active isolations  Medications Medications  cefTRIAXone (ROCEPHIN) 2 g in sodium chloride 0.9 % 100 mL  IVPB (has no administration in time range)    And  metroNIDAZOLE (FLAGYL) IVPB 500 mg (has no administration in time range)    Mobility manual wheelchair     Focused Assessments Cardiac Assessment Handoff:    Lab Results  Component Value Date   CKTOTAL 362 08/09/2020   CKMB 3.8 01/09/2011   TROPONINI <0.30 01/09/2011   No results found for: "DDIMER" Does the Patient currently have chest pain? No    R Recommendations: See Admitting Provider Note  Report given to:   Additional Notes: Family at bedside.

## 2023-02-05 NOTE — Progress Notes (Signed)
   02/05/23 2342  BiPAP/CPAP/SIPAP  $ Non-Invasive Ventilator  Non-Invasive Vent Set Up;Non-Invasive Vent Initial  $ Face Mask XL Yes  BiPAP/CPAP/SIPAP Pt Type Adult  BiPAP/CPAP/SIPAP Resmed  Mask Type Full face mask  Mask Size Extra large  Respiratory Rate 16 breaths/min  EPAP 10 cmH2O  FiO2 (%) 21 %  Flow Rate 0 lpm  Patient Home Equipment No  Auto Titrate No  BiPAP/CPAP /SiPAP Vitals  Pulse Rate 74  SpO2 98 %  MEWS Score/Color  MEWS Score 0  MEWS Score Color [place] Si

## 2023-02-05 NOTE — H&P (Signed)
History and Physical    Patient: Micheal Walls XBJ:[id number] DOB: 1935/10/06 DOA: 02/05/2023 DOS: the patient was seen and examined on 02/05/2023 PCP: [name], [name]  Patient coming from: Home  Chief Complaint:  Chief Complaint  Patient presents with   Toe Injury   HPI: Micheal Walls is a 87 y.o. male with PMH of DM-2, PVD, CVA, diastolic CHF, CKD, HTN, BPH, right BKA, left first and second toe amputation, gout and obesity presenting with purulent drainage from his left third toe after he accidentally hit it.  Patient reports stomping his toe when he was trying to transfer out of power wheelchair at home 4 days ago.  He felt some pain but he did not notice bleeding or bruising at that time.  Today, he noted some pus coming out that prompted him to come to ED.  Reports pain with weightbearing.  Denies fever or chills.  Denies numbness or tingling.  Denies URI symptoms, chest pain, shortness of breath, nausea, vomiting, abdominal pain, dysuria, frequency, urgency or new focal neurosymptoms.  Patient reports living with his wife.  Denies smoking cigarette, drinking alcohol or recreational drug use.  Interested in cardiopulmonary resuscitation in an event of sudden cardiopulmonary arrest.  In ED, stable vitals. Cr 2.23 (baseline range from 1.9-2.1).  Glucose 148.  BUN 70.  WBC 5.3.  Hgb 10.4.  Lactic acid 0.8.  Foot x-ray concerning for osteomyelitis of left third toe and edema in his left foot.  Podiatry, Dr. Lilian Kapur consulted and recommended ABI, MRI and admission to medicine.  Ceftriaxone, Flagyl, ABI and MRI ordered.  Hospitalist service called for admission.  Review of Systems: As mentioned in the history of present illness. All other systems reviewed and are negative. Past Medical History:  Diagnosis Date   Acute metabolic encephalopathy 01/16/2020   Amputated below knee [facility])    right   Anemia    Aspiration pneumonia (HCC) 11/02/2019   Diastolic heart failure (HCC)     Diverticulosis    DM (diabetes mellitus) (HCC)    Gout    Hyperlipemia    OSA on CPAP    RBBB    Sepsis (HCC) 11/02/2019   Systemic hypertension    Past Surgical History:  Procedure Laterality Date   ABDOMINAL AORTOGRAM W/LOWER EXTREMITY Left 08/11/2020   Procedure: ABDOMINAL AORTOGRAM W/LOWER EXTREMITY;  Surgeon: Nada Libman, MD;  [facility];  Service: Cardiovascular;  Laterality: Left;   ABDOMINAL AORTOGRAM W/LOWER EXTREMITY N/A 02/26/2021   Procedure: ABDOMINAL AORTOGRAM W/LOWER EXTREMITY;  Surgeon: Leonie Douglas, MD;  [facility];  Service: Cardiovascular;  Laterality: N/A;   AMPUTATION Left 09/14/2020   Procedure: AMPUTATION LEFT GREAT TOE;  Surgeon: Edwin Cap, DPM;  Location: [facility];  Service: Podiatry;  Laterality: Left;   AMPUTATION TOE Left 12/03/2021   Procedure: AMPUTATION TOE, LEFT second;  Surgeon: Louann Sjogren, DPM;  Location: [facility];  Service: Podiatry;  Laterality: Left;   LEG AMPUTATION BELOW KNEE     right   NM MYOCAR PERF WALL MOTION  09/18/2009   no ischemia   PERIPHERAL VASCULAR BALLOON ANGIOPLASTY Left 08/11/2020   Procedure: PERIPHERAL VASCULAR BALLOON ANGIOPLASTY;  Surgeon: Nada Libman, MD;  [facility];  Service: Cardiovascular;  Laterality: Left;  AT   PERIPHERAL VASCULAR INTERVENTION Left 02/26/2021   Procedure: PERIPHERAL VASCULAR INTERVENTION;  Surgeon: Leonie Douglas, MD;  [facility];  Service: Cardiovascular;  Laterality: Left;  Social History:  reports that he quit smoking about 27 years ago. His smoking use included cigars. He has never used smokeless tobacco. He reports that he does not drink alcohol and does not use drugs.  Allergies  Allergen Reactions   Vancomycin Rash    Family History  Problem Relation Age of Onset   Diabetes Mother    Heart attack Father    Cancer Sister     Prior to Admission medications   Medication Sig Start Date End Date Taking?  Authorizing Provider  allopurinol (ZYLOPRIM) 100 MG tablet Take 100 mg by mouth 2 (two) times daily.    [provider]  amoxicillin-clavulanate (AUGMENTIN) 875-125 MG tablet Take 1 tablet by mouth 2 (two) times daily. 09/22/22   Edwin Cap, DPM  aspirin EC 81 MG tablet Take 81 mg by mouth daily. Swallow whole.    [provider]  Robyne Askew Paint 1.5 % LIQD Apply between toes daily 09/22/22   Edwin Cap, DPM  colchicine 0.6 MG tablet Take 0.6 mg by mouth once a week. 11/16/20   [provider]  Continuous Glucose Sensor (FREESTYLE LIBRE 14 DAY SENSOR) MISC Use as directed to check blood sugars 01/30/23   [name], [name]  diltiazem (CARDIZEM CD) 240 MG 24 hr capsule TAKE ONE CAPSULE BY MOUTH ONCE DAILY 01/05/23   [name], Rachelle Hora, MD  dorzolamide (TRUSOPT) 2 % ophthalmic solution Place 1 drop into both eyes 2 (two) times daily.    [provider]  ferrous sulfate 325 (65 FE) MG tablet Take 1 tablet (325 mg total) by mouth 2 (two) times daily with a meal. 12/04/21   Rhetta Mura, MD  furosemide (LASIX) 40 MG tablet Take 1 tablet (40 mg total) by mouth 2 (two) times daily. 10/04/22   [name], [name]  gabapentin (NEURONTIN) 100 MG capsule TAKE TWO CAPSULES BY MOUTH EVERY MORNING, TAKE ONE CAPSULE BY MOUTH AT NOON, TAKE ONE CAPSULE BY MOUTH EVERY EVENING 10/04/22   [name], [name]  gentamicin cream (GARAMYCIN) 0.1 % APPLY 1 APPLICATION. TOPICALLY DAILY. TO FOOT WOUND WITH DRESSING CHANGES 01/30/23   Edwin Cap, DPM  glucose blood (FREESTYLE PRECISION NEO TEST) test strip Use as instructed 07/30/19   [name], [name]  ketotifen (ZADITOR) 0.025 % ophthalmic solution Place 1 drop into both eyes 2 (two) times daily.    [provider]  latanoprost (XALATAN) 0.005 % ophthalmic solution Place 1 drop into both eyes at bedtime.  10/12/18   [provider]  Multiple Vitamins-Minerals (MULTIVITAMIN WITH MINERALS) tablet Take 1 tablet by  mouth daily.    [provider]  Multiple Vitamins-Minerals (PRESERVISION AREDS) CAPS Take 2 capsules by mouth daily.    [provider]  pantoprazole (PROTONIX) 40 MG tablet TAKE ONE TABLET BY MOUTH EVERYDAY AT BEDTIME 10/04/22   [name], [name]  rosuvastatin (CRESTOR) 20 MG tablet TAKE ONE TABLET BY MOUTH EVERYDAY AT BEDTIME 07/04/22   [name], [name], MD  Semaglutide, [name], (OZEMPIC, [name],) 2 MG/1.5ML SOPN Inject 1 mg into the skin once a week. 01/30/23   [name], [name]  senna-docusate (SENOKOT-S) 8.6-50 MG tablet Take 1 tablet by mouth 2 (two) times daily between meals as needed for mild constipation or moderate constipation. Patient taking differently: Take 1 tablet by mouth 2 (two) times daily as needed (constipation). 10/24/20   Almon Hercules, MD  SYNTHROID 25 MCG tablet Take 1 tablet by mouth Monday - Friday 10/04/22   [name], [name]  tamsulosin (FLOMAX) 0.4 MG CAPS capsule Take 0.4 mg by mouth daily.    [provider]    Physical Exam: Vitals:   02/05/23 1229 02/05/23 1232 02/05/23 1515 02/05/23 1530  BP:  (!) 133/53 138/60 (!) 135/57  Pulse:  71 71 70  Resp:  18 15 19   Temp:  97.7 F (36.5 C)    TempSrc:  Oral    SpO2:  96% 94% 96%  Weight: 113.4 kg     Height: 6\' 1"  (1.854 m)      GENERAL: No apparent distress.  Nontoxic. HEENT: MMM.  Vision and hearing grossly intact.  NECK: Supple.  No apparent JVD.  RESP:  No IWOB.  Fair aeration bilaterally. CVS:  RRR. Heart sounds normal.  ABD/GI/GU: BS+. Abd soft, NTND.  MSK/EXT: Right BKA.  S/p left first and second toe amputation.  Left leg lymphedema with some erythema and increased warmth to touch.  Skin discoloration with erythema and ulceration at tip left third toe.  No bleeding or purulent drainage.  Faint DP pulses.  See picture below.  SKIN: As above. NEURO: Awake and alert. Oriented appropriately.  No apparent focal neuro deficit. PSYCH: Calm. Normal affect.    Data  Reviewed: See HPI  Assessment and Plan: Principal Problem:   Acute osteomyelitis of left foot (HCC) Active Problems:   CKD (chronic kidney disease), stage III (HCC)   Essential hypertension   Chronic diastolic (congestive) heart failure (HCC)   History of stroke   Gout   OSA on CPAP   Morbid obesity (HCC)   Anemia   History of peripheral vascular disease   Diabetic foot infection (HCC)  Acute osteomyelitis of left third toe: Presents with purulent drainage for days after injury.  Patient is diabetic.  X-ray and MRI concerning for osteomyelitis.  ABI with noncompressible DPA.  -Podiatry, Dr. Lilian Kapur consulted and recommended ABI, MRI and IV antibiotics -Check CRP and ESR. -Follow-up blood culture -Continue antibiotics with ceftriaxone and Flagyl. -N.p.o. after midnight   Non-insulin-dependent diabetes: A1c 6.3% on 11/24/2022.  Seems to be on Ozempic -CBG monitoring and SSI-sensitive  Chronic diastolic CHF: TTE in 07/2020 with LVEF of 55 to 60%, moderate LVH.  Seems to be on p.o. Lasix at home.  Appears euvolemic on exam.  No cardiopulmonary symptoms -Continue home Lasix. -Strict intake and output, daily weight, renal functions and electrolytes  CKD-3B: Baseline Cr ~1.9-2.1. Recent Labs    03/21/22 1432 04/26/22 0956 07/25/22 1136 11/08/22 1021 02/05/23 1244  BUN 56* 69* 55* 55* 70*  CREATININE 1.73* 2.12* 1.90* 1.91* 2.23*  -Monitor  History of CVA/hyperlipidemia -Continue home meds  OSA on CPAP. -Continue CPAP at night  Hypothyroidism -Continue home Synthroid  History of gout -Continue home meds  BPH without LUTS -Continue home Flomax.  Anemia: Hgb 10.4.  Baseline seems to be 11-12.  Denies melena or hematochezia. Recent Labs    04/26/22 0956 11/08/22 1021 02/05/23 1244  HGB 11.6* 11.7* 10.4*  -Monitor  Glaucoma -Continue home eyedrops.   Advance Care Planning:   Code Status: Full Code.  Discussed with patient.  Consults: Podiatry  Family  Communication: None at bedside.  Severity of Illness: The appropriate patient status for this patient is INPATIENT. Inpatient status is judged to be reasonable and necessary in order to provide the required intensity of service to ensure the patient's safety. The patient's presenting symptoms, physical exam findings, and initial radiographic and laboratory data in the context of their chronic comorbidities is felt to place  them at high risk for further clinical deterioration. Furthermore, it is not anticipated that the patient will be medically stable for discharge from the hospital within 2 midnights of admission.   * I certify that at the point of admission it is my clinical judgment that the patient will require inpatient hospital care spanning beyond 2 midnights from the point of admission due to high intensity of service, high risk for further deterioration and high frequency of surveillance required.*  Author: Almon Hercules, MD 02/05/2023 4:28 PM  For on call review [email or web address].

## 2023-02-05 NOTE — ED Provider Notes (Signed)
[place] EMERGENCY DEPARTMENT AT [facility] Provider Note   CSN: [id number] Arrival date & time: 02/05/23  1216     History  Chief Complaint  Patient presents with   Toe Injury    Micheal Walls is a 87 y.o. male.  Pt is a 87 yo male with pmhx significant for chf, dm, hld, and gout.  Pt said he accidentally hit his left 3rd toe on something on Wed. 8/21.  He noticed some pus coming out of his toe with some pain.  Pt does see Dr. Lilian Kapur (podiatry) who has previously amputated several toes.         Home Medications Prior to Admission medications   Medication Sig Start Date End Date Taking? Authorizing Provider  allopurinol (ZYLOPRIM) 100 MG tablet Take 100 mg by mouth 2 (two) times daily.    [provider]  amoxicillin-clavulanate (AUGMENTIN) 875-125 MG tablet Take 1 tablet by mouth 2 (two) times daily. 09/22/22   Edwin Cap, DPM  aspirin EC 81 MG tablet Take 81 mg by mouth daily. Swallow whole.    [provider]  Robyne Askew Paint 1.5 % LIQD Apply between toes daily 09/22/22   Edwin Cap, DPM  colchicine 0.6 MG tablet Take 0.6 mg by mouth once a week. 11/16/20   [provider]  Continuous Glucose Sensor (FREESTYLE LIBRE 14 DAY SENSOR) MISC Use as directed to check blood sugars 01/30/23   [name], [name]  diltiazem (CARDIZEM CD) 240 MG 24 hr capsule TAKE ONE CAPSULE BY MOUTH ONCE DAILY 01/05/23   [name], Rachelle Hora, MD  dorzolamide (TRUSOPT) 2 % ophthalmic solution Place 1 drop into both eyes 2 (two) times daily.    [provider]  ferrous sulfate 325 (65 FE) MG tablet Take 1 tablet (325 mg total) by mouth 2 (two) times daily with a meal. 12/04/21   Rhetta Mura, MD  furosemide (LASIX) 40 MG tablet Take 1 tablet (40 mg total) by mouth 2 (two) times daily. 10/04/22   [name], [name]  gabapentin (NEURONTIN) 100 MG capsule TAKE TWO CAPSULES BY MOUTH EVERY MORNING, TAKE ONE CAPSULE BY MOUTH AT NOON, TAKE ONE CAPSULE  BY MOUTH EVERY EVENING 10/04/22   [name], [name]  gentamicin cream (GARAMYCIN) 0.1 % APPLY 1 APPLICATION. TOPICALLY DAILY. TO FOOT WOUND WITH DRESSING CHANGES 01/30/23   Edwin Cap, DPM  glucose blood (FREESTYLE PRECISION NEO TEST) test strip Use as instructed 07/30/19   [name], [name]  ketotifen (ZADITOR) 0.025 % ophthalmic solution Place 1 drop into both eyes 2 (two) times daily.    [provider]  latanoprost (XALATAN) 0.005 % ophthalmic solution Place 1 drop into both eyes at bedtime.  10/12/18   [provider]  Multiple Vitamins-Minerals (MULTIVITAMIN WITH MINERALS) tablet Take 1 tablet by mouth daily.    [provider]  Multiple Vitamins-Minerals (PRESERVISION AREDS) CAPS Take 2 capsules by mouth daily.    [provider]  pantoprazole (PROTONIX) 40 MG tablet TAKE ONE TABLET BY MOUTH EVERYDAY AT BEDTIME 10/04/22   [name], [name]  rosuvastatin (CRESTOR) 20 MG tablet TAKE ONE TABLET BY MOUTH EVERYDAY AT BEDTIME 07/04/22   [name], [name], MD  Semaglutide, [name], (OZEMPIC, [name],) 2 MG/1.5ML SOPN Inject 1 mg into the skin once a week. 01/30/23   [name], [name]  senna-docusate (SENOKOT-S) 8.6-50 MG tablet Take 1 tablet by mouth 2 (two) times daily between meals as needed for mild constipation or moderate constipation. Patient taking  differently: Take 1 tablet by mouth 2 (two) times daily as needed (constipation). 10/24/20   Almon Hercules, MD  SYNTHROID 25 MCG tablet Take 1 tablet by mouth Monday - Friday 10/04/22   [name], [name]  tamsulosin (FLOMAX) 0.4 MG CAPS capsule Take 0.4 mg by mouth daily.    [provider]      Allergies    Vancomycin    Review of Systems   Review of Systems  Musculoskeletal:        Left 3rd toe pain  All other systems reviewed and are negative.   Physical Exam Updated Vital Signs BP (!) 133/53 (BP Location: Right Arm)   Pulse 71   Temp 97.7 F (36.5 C) (Oral)   Resp 18   Ht 6\' 1"   (1.854 m)   Wt 113.4 kg   SpO2 96%   BMI 32.98 kg/m  Physical Exam Vitals and nursing note reviewed.  Constitutional:      Appearance: Normal appearance. He is obese.  HENT:     Head: Normocephalic and atraumatic.     Right Ear: External ear normal.     Left Ear: External ear normal.     Nose: Nose normal.     Mouth/Throat:     Mouth: Mucous membranes are moist.     Pharynx: Oropharynx is clear.  Eyes:     Extraocular Movements: Extraocular movements intact.     Conjunctiva/sclera: Conjunctivae normal.     Pupils: Pupils are equal, round, and reactive to light.  Cardiovascular:     Rate and Rhythm: Normal rate and regular rhythm.     Pulses: Normal pulses.     Heart sounds: Normal heart sounds.  Pulmonary:     Effort: Pulmonary effort is normal.     Breath sounds: Normal breath sounds.  Abdominal:     General: Abdomen is flat. Bowel sounds are normal.     Palpations: Abdomen is soft.  Musculoskeletal:     Cervical back: Normal range of motion and neck supple.     Comments: Left 3rd toe with drainage under nail.  See picture.  R BKA  Skin:    General: Skin is warm.     Capillary Refill: Capillary refill takes less than 2 seconds.  Neurological:     General: No focal deficit present.     Mental Status: He is alert and oriented to person, place, and time.  Psychiatric:        Mood and Affect: Mood normal.        Behavior: Behavior normal.     ED Results / Procedures / Treatments   Labs (all labs ordered are listed, but only abnormal results are displayed) Labs Reviewed  COMPREHENSIVE METABOLIC PANEL - Abnormal; Notable for the following components:      Result Value   Glucose, Bld 148 (*)    BUN 70 (*)    Creatinine, Ser 2.23 (*)    Albumin 3.2 (*)    GFR, Estimated 28 (*)    All other components within normal limits  CBC WITH DIFFERENTIAL/PLATELET - Abnormal; Notable for the following components:   RBC 3.46 (*)    Hemoglobin 10.4 (*)    HCT 31.2 (*)     All other components within normal limits  CULTURE, BLOOD (ROUTINE X 2)  CULTURE, BLOOD (ROUTINE X 2)  C-REACTIVE PROTEIN  SEDIMENTATION RATE  I-STAT CG4 LACTIC ACID, ED  I-STAT CG4 LACTIC ACID, ED    EKG EKG Interpretation Date/Time:  Sunday February 05 2023 12:36:25 EDT Ventricular Rate:  73 PR Interval:  282 QRS Duration:  170 QT Interval:  431 QTC Calculation: 475 R Axis:   27  Text Interpretation: Sinus rhythm Prolonged PR interval Right bundle branch block No significant change since last tracing Confirmed by Jacalyn Lefevre [phone]) on 02/05/2023 12:39:38 PM  Radiology DG Foot Complete Left  Result Date: 02/05/2023 CLINICAL DATA:  Purulent drainage and pain in the left third toe region. EXAM: LEFT FOOT - COMPLETE 3+ VIEW COMPARISON:  12/04/2021 FINDINGS: Prior amputation of the first and second toes. Interval bony destruction of the distal and middle phalanges of the third toe compatible with active osteomyelitis. The distal phalanx is completely demineralized and the middle phalanx is partially demineralized. Extensive atheromatous vascular calcification. Midfoot spurring and spurring at the Lisfranc joint. Large plantar calcaneal spur. Subcutaneous edema circumferentially at the ankle and tracking in the dorsum of the foot. IMPRESSION: 1. Interval bony destructive findings of the distal and middle phalanges of the third toe compatible with active osteomyelitis. 2. Prior amputation of the first and second toes. 3. Extensive atheromatous vascular calcification. 4. Subcutaneous edema circumferentially at the ankle and tracking in the dorsum of the foot. Electronically Signed   By: Gaylyn Rong M.D.   On: 02/05/2023 14:31    Procedures Procedures    Medications Ordered in ED Medications  cefTRIAXone (ROCEPHIN) 2 g in sodium chloride 0.9 % 100 mL IVPB (has no administration in time range)    And  metroNIDAZOLE (FLAGYL) IVPB 500 mg (has no administration in time range)    ED  Course/ Medical Decision Making/ A&P                                 Medical Decision Making Amount and/or Complexity of Data Reviewed Labs: ordered. Radiology: ordered.  Risk Prescription drug management. Decision regarding hospitalization.   This patient presents to the ED for concern of toe pain, this involves an extensive number of treatment options, and is a complaint that carries with it a high risk of complications and morbidity.  The differential diagnosis includes cellulitis,    Co morbidities that complicate the patient evaluation  chf, dm, hld, and gout   Additional history obtained:  Additional history obtained from epic chart review External records from outside source obtained and reviewed including family   Lab Tests:  I Ordered, and personally interpreted labs.  The pertinent results include:  cbc with hgb 10.4, cmp with bun 70 and cr 2.23 (5/28 cr 1.91)   Imaging Studies ordered:  I ordered imaging studies including left foot  I independently visualized and interpreted imaging which showed  Interval bony destructive findings of the distal and middle  phalanges of the third toe compatible with active osteomyelitis.  2. Prior amputation of the first and second toes.  3. Extensive atheromatous vascular calcification.  4. Subcutaneous edema circumferentially at the ankle and tracking in  the dorsum of the foot.   I agree with the radiologist interpretation   Cardiac Monitoring:  The patient was maintained on a cardiac monitor.  I personally viewed and interpreted the cardiac monitored which showed an underlying rhythm of: nsr   Medicines ordered and prescription drug management:  I ordered medication including rocephin/flagyl  for osteomyelitis  Reevaluation of the patient after these medicines showed that the patient improved I have reviewed the patients home medicines and have made adjustments as needed  Test Considered:  MRI   Critical  Interventions:  abx   Consultations Obtained:  I requested consultation with the podiatrist (Dr. Lilian Kapur),  and discussed lab and imaging findings as well as pertinent plan - he recommends admission for iv abx, repeat abi and mri.   Pt d/w Dr. Alanda Slim (triad) for admission.   Problem List / ED Course:  Osteomyelitis + cellulitis:  Iv abx ordered.  MRI and ABIs ordered.   Reevaluation:  After the interventions noted above, I reevaluated the patient and found that they have :improved   Social Determinants of Health:  Lives at home   Dispostion:  After consideration of the diagnostic results and the patients response to treatment, I feel that the patent would benefit from admission.          Final Clinical Impression(s) / ED Diagnoses Final diagnoses:  Osteomyelitis of left foot, unspecified type (HCC)  Cellulitis of left lower extremity  AKI (acute kidney injury) [facility])    Rx / DC Orders ED Discharge Orders     None         Jacalyn Lefevre, MD 02/05/23 1535

## 2023-02-05 NOTE — Progress Notes (Signed)
VASCULAR LAB    ABI has been performed.  See CV proc for preliminary results.   Jezreel Sisk, RVT 02/05/2023, 4:09 PM

## 2023-02-06 ENCOUNTER — Encounter: Payer: Self-pay | Admitting: Podiatry

## 2023-02-06 DIAGNOSIS — M86172 Other acute osteomyelitis, left ankle and foot: Secondary | ICD-10-CM | POA: Diagnosis not present

## 2023-02-06 LAB — GLUCOSE, CAPILLARY
Glucose-Capillary: 101 mg/dL — ABNORMAL HIGH (ref 70–99)
Glucose-Capillary: 125 mg/dL — ABNORMAL HIGH (ref 70–99)
Glucose-Capillary: 98 mg/dL (ref 70–99)
Glucose-Capillary: 127 mg/dL — ABNORMAL HIGH (ref 70–99)

## 2023-02-06 LAB — CBC
HCT: 31.6 % — ABNORMAL LOW (ref 39.0–52.0)
Hemoglobin: 10.2 g/dL — ABNORMAL LOW (ref 13.0–17.0)
MCH: 28.8 pg (ref 26.0–34.0)
MCHC: 32.3 g/dL (ref 30.0–36.0)
MCV: 89.3 fL (ref 80.0–100.0)
Platelets: 213 10*3/uL (ref 150–400)
RBC: 3.54 MIL/uL — ABNORMAL LOW (ref 4.22–5.81)
RDW: 15.2 % (ref 11.5–15.5)
WBC: 4.7 10*3/uL (ref 4.0–10.5)
nRBC: 0 % (ref 0.0–0.2)

## 2023-02-06 LAB — COMPREHENSIVE METABOLIC PANEL
ALT: 29 U/L (ref 0–44)
AST: 22 U/L (ref 15–41)
Albumin: 2.8 g/dL — ABNORMAL LOW (ref 3.5–5.0)
Alkaline Phosphatase: 65 U/L (ref 38–126)
Anion gap: 9 (ref 5–15)
BUN: 67 mg/dL — ABNORMAL HIGH (ref 8–23)
CO2: 26 mmol/L (ref 22–32)
Calcium: 9.5 mg/dL (ref 8.9–10.3)
Chloride: 104 mmol/L (ref 98–111)
Creatinine, Ser: 2.25 mg/dL — ABNORMAL HIGH (ref 0.61–1.24)
GFR, Estimated: 28 mL/min — ABNORMAL LOW (ref >60)
Glucose, Bld: 125 mg/dL — ABNORMAL HIGH (ref 70–99)
Potassium: 4 mmol/L (ref 3.5–5.1)
Sodium: 139 mmol/L (ref 135–145)
Total Bilirubin: 0.4 mg/dL (ref 0.3–1.2)
Total Protein: 6.6 g/dL (ref 6.5–8.1)

## 2023-02-06 NOTE — Consult Note (Addendum)
  Subjective:  Patient ID: Micheal Walls, male    DOB: [birth year],  MRN: [id number]  A 87 y.o. male  PMH of DM-2, PVD, CVA, diastolic CHF, CKD, HTN, BPH, right BKA, left first and second toe amputation, gout and obesity presenting left third digit osteomyelitis.  He states is doing okay.  Denies any other acute complaints.  No nausea fever chills vomiting. Objective:   Vitals:   02/06/23 0450 02/06/23 0718  BP: (!) 125/42 (!) 109/46  Pulse: 65 67  Resp: 18 18  Temp: 97.8 F (36.6 C) 97.8 F (36.6 C)  SpO2: 97% 98%   General AA&O x3. Normal mood and affect.  Vascular Dorsalis pedis and posterior tibial pulses 2/4 bilat. Brisk capillary refill to all digits. Pedal hair present.  Neurologic Epicritic sensation grossly intact.  Dermatologic Left third digit ulceration probing down to bone with purulent drainage.  Mild erythema noted.  No other abnormalities noted.  Orthopedic: MMT 5/5 in dorsiflexion, plantarflexion, inversion, and eversion. Normal joint ROM without pain or crepitus.    IMPRESSION: 1. Osteomyelitis of the phalanges of the third toe. 2. Prior amputation of the first and second toes. 3. Diffuse muscular atrophy. 4. Diffuse subcutaneous edema along the forefoot although most concentrated dorsally.   Assessment & Plan:  Patient was evaluated and treated and all questions answered.  Left third digit osteomyelitis -All questions and concerns were discussed with the patient in extensive detail -Patient will benefit from left third digit amputation. -He has questionable flow to the left lower extremity however he was able to heal previous amputation sites with the same flow there from hopeful that he will be able to heal this third digit amputation as well. -Patient may benefit from outpatient vascular follow-up -Weightbearing as tolerated in surgical shoe -Plan to take the patient to the OR tomorrow for left third digit amputation however given that patient only has  remaining 2 toes from a functional standpoint it is only a matter of time before the fourth and fifth digit also become ulcerated and infected therefore I discussed with the patient for removal of all the toes including third and fourth and fifth digit.  He is in agreement would like to proceed with that -N.p.o. after midnight -Betadine wet-to-dry dressing  [name], DPM  Accessible via secure chat for questions or concerns.

## 2023-02-06 NOTE — Plan of Care (Signed)
  Problem: Education: Goal: Ability to describe self-care measures that may prevent or decrease complications (Diabetes Survival Skills Education) will improve Outcome: Progressing   Problem: Nutritional: Goal: Maintenance of adequate nutrition will improve Outcome: Progressing   Problem: Nutrition: Goal: Adequate nutrition will be maintained Outcome: Progressing   Problem: Pain Managment: Goal: General experience of comfort will improve Outcome: Progressing

## 2023-02-06 NOTE — Progress Notes (Addendum)
PROGRESS NOTE    Micheal Walls  VWU:[id number] DOB: [birth year] DOA: 02/05/2023 PCP: [name], [name]   Brief Narrative:  This 87 y.o. male with PMH of DM-2, PVD, CVA, diastolic CHF, CKD, HTN, BPH, right BKA, left first and second toe amputation, gout and obesity presenting with purulent drainage from his left third toe after he accidentally hit it.  Patient reports pain with weightbearing.  ED workup reveals serum creatinine 2.23 up from baseline 1.9.  X-ray left foot concerning for osteomyelitis of left third toe and edema in his left foot podiatry was consulted recommended ABI, MRI and started on empiric antibiotics ceftriaxone and Flagyl. MRI confirmed osteomyelitis of the phalanx of the third toe.  Podiatry has scheduled patient for left third digit amputation tomorrow.  Assessment & Plan:   Principal Problem:   Acute osteomyelitis of left foot (HCC) Active Problems:   CKD (chronic kidney disease), stage III (HCC)   Essential hypertension   Chronic diastolic (congestive) heart failure (HCC)   History of stroke   Gout   OSA on CPAP   Morbid obesity (HCC)   Anemia   History of peripheral vascular disease   Diabetic foot infection (HCC)   Acute osteomyelitis of left third toe:  Patient presented with purulent drainage for days after injury.   Patient is diabetic.  X-ray and MRI concerning for osteomyelitis.  ABI with noncompressible DPA.  -Podiatry, Dr. Lilian Kapur consulted and recommended ABI, MRI and IV antibiotics -CRP 3.7, ESR 70, lactic acid 1.7 -Follow-up blood culture -Continue antibiotics with ceftriaxone and Flagyl. -N.p.o. after midnight, for possible Left third toe amputation tomorrow.     Non-insulin-dependent diabetes:  HbA1c 6.3% on 11/24/2022.  Seems to be on Ozempic -CBG monitoring and SSI-sensitive.   Chronic diastolic CHF:  TTE in 07/2020 with LVEF of 55 to 60%, moderate LVH.  Seems to be on p.o. Lasix at home.   Appears euvolemic on exam.  No  cardiopulmonary symptoms Continue home Lasix. Strict intake and output, daily weight, renal functions and electrolytes   CKD-3B: Baseline Cr ~1.9-2.1. Serum creatinine at baseline.  Avoid nephrotoxic medications.   History of CVA/hyperlipidemia: Continue home meds   OSA on CPAP: Continue CPAP at night   Hypothyroidism: -Continue Synthroid   History of gout: -Continue home meds   BPH without LUTS: -Continue home Flomax.   Anemia:  Hgb 10.4.  Baseline seems to be 11-12.   Denies melena or hematochezia.    Glaucoma: -Continue home eyedrops.     DVT prophylaxis: Lovenox Code Status: Full code. Family Communication: (No family at bed side) Disposition Plan:    Status is: Inpatient Remains inpatient appropriate because: Admitted for osteomyelitis of the left third toe, podiatry consulted and scheduled for left third toe amputation tomorrow    Consultants:  Podiatry  Procedures:  Antimicrobials:  Anti-infectives (From admission, onward)    Start     Dose/Rate Route Frequency Ordered Stop   02/05/23 1530  cefTRIAXone (ROCEPHIN) 2 g in sodium chloride 0.9 % 100 mL IVPB       Placed in "And" Linked Group   2 g 200 mL/hr over 30 Minutes Intravenous Every 24 hours 02/05/23 1526 02/12/23 1529   02/05/23 1530  metroNIDAZOLE (FLAGYL) IVPB 500 mg       Placed in "And" Linked Group   500 mg 100 mL/hr over 60 Minutes Intravenous Every 12 hours 02/05/23 1526 02/12/23 1529      Subjective: Patient was seen and examined at bedside.  Overnight  events noted.   Patient reports doing much better.  He denies any pain and states he has drainage. He is scheduled for third toe amputation tomorrow.  Objective: Vitals:   02/05/23 2342 02/06/23 0450 02/06/23 0718 02/06/23 1404  BP:  (!) 125/42 (!) 109/46 (!) 113/48  Pulse: 74 65 67 65  Resp:  18 18 18   Temp:  97.8 F (36.6 C) 97.8 F (36.6 C) 98 F (36.7 C)  TempSrc:   Oral Oral  SpO2: 98% 97% 98% 96%  Weight:       Height:        Intake/Output Summary (Last 24 hours) at 02/06/2023 1528 Last data filed at 02/06/2023 1400 Gross per 24 hour  Intake 933.63 ml  Output 2550 ml  Net -1616.37 ml   Filed Weights   02/05/23 1229  Weight: 113.4 kg    Examination:  General exam: Appears calm and comfortable, not in any acute distress Respiratory system: Clear to auscultation. Respiratory effort normal.  RR 16 Cardiovascular system: S1 & S2 heard, RRR. No JVD, murmurs, rubs, gallops or clicks. No pedal edema. Gastrointestinal system: Abdomen is nondistended, soft and nontender. Normal bowel sounds heard. Central nervous system: Alert and oriented X3. No focal neurological deficits. Extremities: Right BKA, left third toe erythema, drainage swelling Skin: No rashes, lesions or ulcers Psychiatry: Judgement and insight appear normal. Mood & affect appropriate.     Data Reviewed: I have personally reviewed following labs and imaging studies  CBC: Recent Labs  Lab 02/05/23 1244 02/06/23 0441  WBC 5.3 4.7  NEUTROABS 3.4  --   HGB 10.4* 10.2*  HCT 31.2* 31.6*  MCV 90.2 89.3  PLT 201 213   Basic Metabolic Panel: Recent Labs  Lab 02/05/23 1244 02/06/23 0441  NA 136 139  K 4.1 4.0  CL 100 104  CO2 23 26  GLUCOSE 148* 125*  BUN 70* 67*  CREATININE 2.23* 2.25*  CALCIUM 9.5 9.5   GFR: Estimated Creatinine Clearance: 31.7 mL/min (A) (by C-G formula based on SCr of 2.25 mg/dL (H)). Liver Function Tests: Recent Labs  Lab 02/05/23 1244 02/06/23 0441  AST 34 22  ALT 32 29  ALKPHOS 72 65  BILITOT 0.4 0.4  PROT 7.4 6.6  ALBUMIN 3.2* 2.8*   No results for input(s): "LIPASE", "AMYLASE" in the last 168 hours. No results for input(s): "AMMONIA" in the last 168 hours. Coagulation Profile: No results for input(s): "INR", "PROTIME" in the last 168 hours. Cardiac Enzymes: No results for input(s): "CKTOTAL", "CKMB", "CKMBINDEX", "TROPONINI" in the last 168 hours. BNP (last 3 results) No  results for input(s): "PROBNP" in the last 8760 hours. HbA1C: Recent Labs    02/05/23 1244  HGBA1C 6.3*   CBG: Recent Labs  Lab 02/05/23 1828 02/05/23 1920 02/06/23 0718 02/06/23 1117  GLUCAP 109* 102* 101* 125*   Lipid Profile: No results for input(s): "CHOL", "HDL", "LDLCALC", "TRIG", "CHOLHDL", "LDLDIRECT" in the last 72 hours. Thyroid Function Tests: No results for input(s): "TSH", "T4TOTAL", "FREET4", "T3FREE", "THYROIDAB" in the last 72 hours. Anemia Panel: No results for input(s): "VITAMINB12", "FOLATE", "FERRITIN", "TIBC", "IRON", "RETICCTPCT" in the last 72 hours. Sepsis Labs: Recent Labs  Lab 02/05/23 1251  LATICACIDVEN 0.9    Recent Results (from the past 240 hour(s))  Blood Cultures x 2 sites     Status: None (Preliminary result)   Collection Time: 02/05/23  5:53 PM   Specimen: BLOOD  Result Value Ref Range Status   Specimen Description BLOOD BLOOD RIGHT ARM  Final   Special Requests   Final    BOTTLES DRAWN AEROBIC AND ANAEROBIC Blood Culture adequate volume   Culture   Final    NO GROWTH < 24 HOURS Performed at [facility], [address] [place], [place] [address]    Report Status PENDING  Incomplete  Blood Cultures x 2 sites     Status: None (Preliminary result)   Collection Time: 02/05/23  5:54 PM   Specimen: BLOOD  Result Value Ref Range Status   Specimen Description BLOOD LEFT ANTECUBITAL  Final   Special Requests   Final    BOTTLES DRAWN AEROBIC AND ANAEROBIC Blood Culture adequate volume   Culture   Final    NO GROWTH < 24 HOURS Performed at [facility], [address] [place], [place] [address]    Report Status PENDING  Incomplete    Radiology Studies: VAS [place] ABI WITH/WO TBI  Result Date: 02/05/2023  LOWER EXTREMITY DOPPLER STUDY Patient Name:  DAMONI WHEELUS  Date of Exam:   02/05/2023 Medical Rec #: [id number]          Accession #:    [id number] Date of Birth: 08/29/1935          Patient Gender: M Patient Age:   43  years Exam Location:  [facility] Procedure:      VAS [place] ABI WITH/WO TBI Referring Phys: JULIE HAVILAND --------------------------------------------------------------------------------  Indications: Ulceration. Osteomyelitis by X-ray  Vascular Interventions: Left anterior tibial artery angioplasty 08/11/2020. Right                         BKA, Amputation of left great toe 09/2020 and left second                         toe 11/2021. Limitations: Today's exam was limited due to Edema/habitus of foot. Comparison Study: Prior ABI 08/11/2022 Performing Technologist: Sherren Kerns RVS  Examination Guidelines: A complete evaluation includes at minimum, Doppler waveform signals and systolic blood pressure reading at the level of bilateral brachial, anterior tibial, and posterior tibial arteries, when vessel segments are accessible. Bilateral testing is considered an integral part of a complete examination. Photoelectric Plethysmograph (PPG) waveforms and toe systolic pressure readings are included as required and additional duplex testing as needed. Limited examinations for reoccurring indications may be performed as noted.  ABI Findings: +--------+------------------+-----+--------+--------+ Right   Rt Pressure (mmHg)IndexWaveformComment  +--------+------------------+-----+--------+--------+ Brachial150                                     +--------+------------------+-----+--------+--------+ PTA                                    BKA      +--------+------------------+-----+--------+--------+ DP                                     BKA      +--------+------------------+-----+--------+--------+ +---------+------------------+-----+----------+------------------------+ Left     Lt Pressure (mmHg)IndexWaveform  Comment                  +---------+------------------+-----+----------+------------------------+ Brachial 157                                                        +---------+------------------+-----+----------+------------------------+  PTA      254               1.62 monophasic                         +---------+------------------+-----+----------+------------------------+ DP       154               0.98 monophasic                         +---------+------------------+-----+----------+------------------------+ Great Toe                                 3rd toe has Doppler flow +---------+------------------+-----+----------+------------------------+ +-------+----------------+----------------+------------------+-----------------+ ABI/TBIToday's ABI     Today's TBI     Previous ABI      Previous TBI      +-------+----------------+----------------+------------------+-----------------+ Right  BKA             BKA             BKA               BKA               +-------+----------------+----------------+------------------+-----------------+ Left   1.62 non        3rd toe has     1.5 non           Great toe                compressible    flow, unable to compressible      amputation                               do pressure                                                                secondary to                                                               small size                                          +-------+----------------+----------------+------------------+-----------------+ Arterial wall calcification precludes accurate ankle pressures and ABIs. Left ABIs appear essentially unchanged compared to prior study on 08/11/22.  Summary: Right: BKA. Left: Resting left ankle-brachial index indicates noncompressible left lower extremity arteries. Left PTA is calcified. Right DP/AT 0.98. 3rd toe has flow detected by Doppler, however unable to obtain toe pressure secondary to small size. *See table(s) above for measurements and observations.  Electronically signed by Coral Else MD on 02/05/2023 at 8:54:49 PM.    Final     MR FOOT LEFT WO CONTRAST  Result Date: 02/05/2023 CLINICAL DATA:  Osteomyelitis of the foot EXAM: MRI OF THE LEFT FOOT WITHOUT CONTRAST TECHNIQUE:  Multiplanar, multisequence MR imaging of the left forefoot was performed. No intravenous contrast was administered. COMPARISON:  Radiographs 02/05/2023 FINDINGS: Bones/Joint/Cartilage Prior amputation of the first and second toes. Abnormal diffuse marrow edema in the phalanges of the third toe compatible with active osteomyelitis. The proximal and middle phalanges are observed, distal phalanx may be completely resorbed. Ligaments Lisfranc ligament intact Muscles and Tendons Diffuse muscular atrophy. Lax flexor hallucis longus as expected given the amputation. Soft tissues Diffuse subcutaneous edema along the forefoot although most concentrated dorsally. IMPRESSION: 1. Osteomyelitis of the phalanges of the third toe. 2. Prior amputation of the first and second toes. 3. Diffuse muscular atrophy. 4. Diffuse subcutaneous edema along the forefoot although most concentrated dorsally. Electronically Signed   By: Gaylyn Rong M.D.   On: 02/05/2023 17:57   DG Foot Complete Left  Result Date: 02/05/2023 CLINICAL DATA:  Purulent drainage and pain in the left third toe region. EXAM: LEFT FOOT - COMPLETE 3+ VIEW COMPARISON:  12/04/2021 FINDINGS: Prior amputation of the first and second toes. Interval bony destruction of the distal and middle phalanges of the third toe compatible with active osteomyelitis. The distal phalanx is completely demineralized and the middle phalanx is partially demineralized. Extensive atheromatous vascular calcification. Midfoot spurring and spurring at the Lisfranc joint. Large plantar calcaneal spur. Subcutaneous edema circumferentially at the ankle and tracking in the dorsum of the foot. IMPRESSION: 1. Interval bony destructive findings of the distal and middle phalanges of the third toe compatible with active osteomyelitis. 2. Prior  amputation of the first and second toes. 3. Extensive atheromatous vascular calcification. 4. Subcutaneous edema circumferentially at the ankle and tracking in the dorsum of the foot. Electronically Signed   By: Gaylyn Rong M.D.   On: 02/05/2023 14:31    Scheduled Meds:  allopurinol  100 mg Oral BID   aspirin EC  81 mg Oral Daily   colchicine  0.6 mg Oral Weekly   diltiazem  240 mg Oral Daily   dorzolamide  1 drop Both Eyes BID   enoxaparin (LOVENOX) injection  40 mg Subcutaneous Q24H   ferrous sulfate  325 mg Oral BID WC   furosemide  40 mg Oral BID   gabapentin  100 mg Oral BID   insulin aspart  0-5 Units Subcutaneous QHS   insulin aspart  0-9 Units Subcutaneous TID WC   latanoprost  1 drop Both Eyes QHS   levothyroxine  25 mcg Oral Q0600   pantoprazole  40 mg Oral Daily   tamsulosin  0.4 mg Oral Daily   Continuous Infusions:  cefTRIAXone (ROCEPHIN)  IV 2 g (02/06/23 1517)   And   metronidazole 500 mg (02/06/23 0453)     LOS: 1 day    Time spent: 50 mins    Willeen Niece, MD [facility]   If 7PM-7AM, please contact night-coverage

## 2023-02-06 NOTE — Progress Notes (Signed)
Pt placed on CPAP to rest for the evening.   02/06/23 2223  BiPAP/CPAP/SIPAP  BiPAP/CPAP/SIPAP Pt Type Adult  BiPAP/CPAP/SIPAP Resmed  Mask Type Full face mask  Mask Size Extra large  Respiratory Rate 16 breaths/min  EPAP 10 cmH2O  FiO2 (%) 21 %  Patient Home Equipment No  Auto Titrate No  BiPAP/CPAP /SiPAP Vitals  Pulse Rate 68  Resp 16  SpO2 99 %  Bilateral Breath Sounds Clear  MEWS Score/Color  MEWS Score 0  MEWS Score Color Chilton Si

## 2023-02-07 ENCOUNTER — Encounter (HOSPITAL_COMMUNITY): Payer: Self-pay | Admitting: Student

## 2023-02-07 ENCOUNTER — Encounter (HOSPITAL_COMMUNITY)
Admission: EM | Disposition: A | Discharge status: Home / Self Care | Payer: Self-pay | Source: Home / Self Care | Attending: Family Medicine

## 2023-02-07 ENCOUNTER — Inpatient Hospital Stay (HOSPITAL_COMMUNITY): Payer: Medicare Other

## 2023-02-07 ENCOUNTER — Inpatient Hospital Stay (HOSPITAL_COMMUNITY): Payer: Medicare Other | Admitting: Certified Registered Nurse Anesthetist

## 2023-02-07 ENCOUNTER — Other Ambulatory Visit: Payer: Self-pay | Admitting: Podiatry

## 2023-02-07 DIAGNOSIS — I130 Hypertensive heart and chronic kidney disease with heart failure and stage 1 through stage 4 chronic kidney disease, or unspecified chronic kidney disease: Secondary | ICD-10-CM

## 2023-02-07 DIAGNOSIS — N183 Chronic kidney disease, stage 3 unspecified: Secondary | ICD-10-CM

## 2023-02-07 DIAGNOSIS — I5032 Chronic diastolic (congestive) heart failure: Secondary | ICD-10-CM | POA: Diagnosis not present

## 2023-02-07 DIAGNOSIS — M86172 Other acute osteomyelitis, left ankle and foot: Secondary | ICD-10-CM | POA: Diagnosis not present

## 2023-02-07 HISTORY — PX: AMPUTATION: SHX166

## 2023-02-07 LAB — CBC
HCT: 30.6 % — ABNORMAL LOW (ref 39.0–52.0)
Hemoglobin: 9.9 g/dL — ABNORMAL LOW (ref 13.0–17.0)
MCH: 29 pg (ref 26.0–34.0)
MCHC: 32.4 g/dL (ref 30.0–36.0)
MCV: 89.7 fL (ref 80.0–100.0)
Platelets: 219 10*3/uL (ref 150–400)
RBC: 3.41 MIL/uL — ABNORMAL LOW (ref 4.22–5.81)
RDW: 15.2 % (ref 11.5–15.5)
WBC: 5.8 10*3/uL (ref 4.0–10.5)
nRBC: 0 % (ref 0.0–0.2)

## 2023-02-07 LAB — BASIC METABOLIC PANEL
Anion gap: 9 (ref 5–15)
BUN: 57 mg/dL — ABNORMAL HIGH (ref 8–23)
CO2: 26 mmol/L (ref 22–32)
Calcium: 9.2 mg/dL (ref 8.9–10.3)
Chloride: 102 mmol/L (ref 98–111)
Creatinine, Ser: 2.09 mg/dL — ABNORMAL HIGH (ref 0.61–1.24)
GFR, Estimated: 30 mL/min — ABNORMAL LOW (ref >60)
Glucose, Bld: 128 mg/dL — ABNORMAL HIGH (ref 70–99)
Potassium: 3.4 mmol/L — ABNORMAL LOW (ref 3.5–5.1)
Sodium: 137 mmol/L (ref 135–145)

## 2023-02-07 LAB — GLUCOSE, CAPILLARY
Glucose-Capillary: 120 mg/dL — ABNORMAL HIGH (ref 70–99)
Glucose-Capillary: 112 mg/dL — ABNORMAL HIGH (ref 70–99)
Glucose-Capillary: 113 mg/dL — ABNORMAL HIGH (ref 70–99)

## 2023-02-07 LAB — PHOSPHORUS: Phosphorus: 3.4 mg/dL (ref 2.5–4.6)

## 2023-02-07 LAB — MAGNESIUM: Magnesium: 2.3 mg/dL (ref 1.7–2.4)

## 2023-02-07 LAB — MRSA NEXT GEN BY PCR, NASAL: MRSA by PCR Next Gen: NOT DETECTED

## 2023-02-07 SURGERY — AMPUTATION DIGIT
Anesthesia: General | Laterality: Left

## 2023-02-07 MED ORDER — PROPOFOL 10 MG/ML IV BOLUS
INTRAVENOUS | Status: AC
  Filled 2023-02-07: qty 20

## 2023-02-07 MED ORDER — FENTANYL CITRATE (PF) 250 MCG/5ML IJ SOLN
INTRAMUSCULAR | Status: AC
  Filled 2023-02-07: qty 5

## 2023-02-07 MED ORDER — LIDOCAINE-EPINEPHRINE 1 %-1:100000 IJ SOLN
INTRAMUSCULAR | Status: AC
  Filled 2023-02-07: qty 1

## 2023-02-07 MED ORDER — CHLORHEXIDINE GLUCONATE 0.12 % MT SOLN: 15.0000 mL | Freq: Once | OROMUCOSAL | Status: AC

## 2023-02-07 MED ORDER — ORAL CARE MOUTH RINSE: 15.0000 mL | Freq: Once | OROMUCOSAL | Status: AC

## 2023-02-07 MED ORDER — LACTATED RINGERS IV SOLN: INTRAVENOUS | Status: DC

## 2023-02-07 MED ORDER — OXYCODONE-ACETAMINOPHEN 5-325 MG PO TABS: 1.0000 | ORAL_TABLET | ORAL | 0 refills | Status: AC | PRN

## 2023-02-07 MED ORDER — CHLORHEXIDINE GLUCONATE 0.12 % MT SOLN
OROMUCOSAL | Status: AC
  Administered 2023-02-07: 15 mL via OROMUCOSAL
  Filled 2023-02-07: qty 15

## 2023-02-07 MED ORDER — BUPIVACAINE HCL 0.5 % IJ SOLN
INTRAMUSCULAR | Status: AC
  Filled 2023-02-07: qty 1

## 2023-02-07 MED ORDER — LIDOCAINE HCL 1 % IJ SOLN
INTRAMUSCULAR | Status: DC | PRN
  Administered 2023-02-07: 10 mL via INTRAMUSCULAR

## 2023-02-07 MED ORDER — POTASSIUM CHLORIDE 10 MEQ/100ML IV SOLN
10.0000 meq | INTRAVENOUS | Status: AC
  Administered 2023-02-07 (×2): 10 meq via INTRAVENOUS
  Filled 2023-02-07 (×2): qty 100

## 2023-02-07 MED ORDER — DOXYCYCLINE MONOHYDRATE 100 MG PO TABS: 100.0000 mg | ORAL_TABLET | Freq: Two times a day (BID) | ORAL | 0 refills | Status: AC

## 2023-02-07 SURGICAL SUPPLY — 36 items
BAG COUNTER SPONGE SURGICOUNT (BAG) IMPLANT
BAG SPNG CNTER NS LX DISP (BAG)
BNDG CMPR 9X4 STRL LF SNTH (GAUZE/BANDAGES/DRESSINGS)
BNDG CMPR STD VLCR NS LF 5.8X4 (GAUZE/BANDAGES/DRESSINGS) ×1
BNDG COHESIVE 4X5 TAN STRL (GAUZE/BANDAGES/DRESSINGS) ×1 IMPLANT
BNDG ELASTIC 4X5.8 VLCR NS LF (GAUZE/BANDAGES/DRESSINGS) IMPLANT
BNDG ELASTIC 4X5.8 VLCR STR LF (GAUZE/BANDAGES/DRESSINGS) ×1 IMPLANT
BNDG ESMARK 4X9 LF (GAUZE/BANDAGES/DRESSINGS) IMPLANT
BNDG GAUZE DERMACEA FLUFF 4 (GAUZE/BANDAGES/DRESSINGS) ×1 IMPLANT
BNDG GZE DERMACEA 4 6PLY (GAUZE/BANDAGES/DRESSINGS) ×1
COVER SURGICAL LIGHT HANDLE (MISCELLANEOUS) ×2 IMPLANT
CUFF TOURN SGL QUICK 18X4 (TOURNIQUET CUFF) ×1 IMPLANT
DRSG DERMACEA NONADH 3X8 (GAUZE/BANDAGES/DRESSINGS) IMPLANT
ELECT REM PT RETURN 9FT ADLT (ELECTROSURGICAL) ×1
ELECTRODE REM PT RTRN 9FT ADLT (ELECTROSURGICAL) ×1 IMPLANT
GAUZE PAD ABD 8X10 STRL (GAUZE/BANDAGES/DRESSINGS) ×1 IMPLANT
GAUZE SPONGE 4X4 12PLY STRL (GAUZE/BANDAGES/DRESSINGS) IMPLANT
GAUZE XEROFORM 1X8 LF (GAUZE/BANDAGES/DRESSINGS) IMPLANT
GLOVE BIO SURGEON STRL SZ7 (GLOVE) ×1 IMPLANT
GLOVE BIOGEL PI IND STRL 7.5 (GLOVE) ×1 IMPLANT
GOWN STRL REUS W/ TWL LRG LVL3 (GOWN DISPOSABLE) ×2 IMPLANT
GOWN STRL REUS W/TWL LRG LVL3 (GOWN DISPOSABLE) ×2
HANDPIECE INTERPULSE COAX TIP (DISPOSABLE) ×1
IV NS 1000ML (IV SOLUTION) ×1
IV NS 1000ML BAXH (IV SOLUTION) ×1 IMPLANT
KIT BASIN OR (CUSTOM PROCEDURE TRAY) ×1 IMPLANT
KIT TURNOVER KIT B (KITS) ×1 IMPLANT
MANIFOLD NEPTUNE II (INSTRUMENTS) ×1 IMPLANT
NDL 22X1.5 STRL (OR ONLY) (MISCELLANEOUS) IMPLANT
NEEDLE 22X1.5 STRL (OR ONLY) (MISCELLANEOUS) IMPLANT
NS IRRIG 1000ML POUR BTL (IV SOLUTION) ×1 IMPLANT
PACK ORTHO EXTREMITY (CUSTOM PROCEDURE TRAY) ×1 IMPLANT
PAD ARMBOARD 7.5X6 YLW CONV (MISCELLANEOUS) ×2 IMPLANT
SET HNDPC FAN SPRY TIP SCT (DISPOSABLE) ×1 IMPLANT
SYR CONTROL 10ML LL (SYRINGE) IMPLANT
TOWEL GREEN STERILE (TOWEL DISPOSABLE) ×1 IMPLANT

## 2023-02-07 NOTE — Interval H&P Note (Signed)
History and Physical Interval Note:  02/07/2023 10:58 AM  Micheal Walls  has presented today for surgery, with the diagnosis of Acute osteomyelitis of left foot.  The various methods of treatment have been discussed with the patient and family. After consideration of risks, benefits and other options for treatment, the patient has consented to  Procedure(s): AMPUTATION TOE 3RD,4TH, and 5TH (Left) as a surgical intervention.  The patient's history has been reviewed, patient examined, no change in status, stable for surgery.  I have reviewed the patient's chart and labs.  Questions were answered to the patient's satisfaction.     [name]

## 2023-02-07 NOTE — Discharge Instructions (Signed)
Advised to follow-up with primary care physician in 1 week. Advised to follow-up with Dr. Allena Katz, Caryn Bee in 1 week. Advised to take doxycycline 100 mg twice daily for 10 days. Advised partial nonweightbearing on the left lower extremity. Advised not to remove dressing until follow-up in the clinic

## 2023-02-07 NOTE — Plan of Care (Signed)
  Problem: Education: Goal: Ability to describe self-care measures that may prevent or decrease complications (Diabetes Survival Skills Education) will improve Outcome: Progressing   Problem: Coping: Goal: Ability to adjust to condition or change in health will improve Outcome: Progressing   Problem: Fluid Volume: Goal: Ability to maintain a balanced intake and output will improve Outcome: Progressing   

## 2023-02-07 NOTE — Anesthesia Preprocedure Evaluation (Signed)
Anesthesia Evaluation  Patient identified by MRN, date of birth, ID band Patient awake    Reviewed: Allergy & Precautions, NPO status , Patient's Chart, lab work & pertinent test results  Airway Mallampati: II  TM Distance: >3 FB     Dental   Pulmonary former smoker   breath sounds clear to auscultation       Cardiovascular hypertension, + Peripheral Vascular Disease and +CHF  + dysrhythmias  Rhythm:Regular Rate:Normal     Neuro/Psych CVA    GI/Hepatic negative GI ROS, Neg liver ROS,,,  Endo/Other  diabetes, Type 2    Renal/GU CRFRenal disease     Musculoskeletal   Abdominal   Peds  Hematology  (+) Blood dyscrasia, anemia   Anesthesia Other Findings   Reproductive/Obstetrics                             Anesthesia Physical Anesthesia Plan  ASA: 3  Anesthesia Plan: MAC   Post-op Pain Management:    Induction:   PONV Risk Score and Plan: 1 and Propofol infusion and Ondansetron  Airway Management Planned: Natural Airway and Simple Face Mask  Additional Equipment:   Intra-op Plan:   Post-operative Plan:   Informed Consent: I have reviewed the patients History and Physical, chart, labs and discussed the procedure including the risks, benefits and alternatives for the proposed anesthesia with the patient or authorized representative who has indicated his/her understanding and acceptance.       Plan Discussed with:   Anesthesia Plan Comments:        Anesthesia Quick Evaluation

## 2023-02-07 NOTE — Progress Notes (Signed)
CCC Pre-op Review  Pre-op checklist: completed  NPO: yes since MN 8/27  Labs: WNL, CBG 120 8/27 @0714   Consent: asked RN to obtain   H&P: consult note 8/26-Patel  Vitals: WNL  O2 requirements: 98% RA  MAR/PTA review: completed ASA 81 given at 0836 Rocephin IV due at 1530 Flagy IV due at 1530  IV: 18g RUA  Floor nurse name:  Olene Craven RN 5N  Additional info:

## 2023-02-07 NOTE — Anesthesia Procedure Notes (Signed)
Date/Time: 02/07/2023 12:18 PM  Performed by: Shary Decamp, CRNAPre-anesthesia Checklist: Patient identified, Emergency Drugs available, Suction available, Timeout performed and Patient being monitored Patient Re-evaluated:Patient Re-evaluated prior to induction Oxygen Delivery Method: Simple face mask

## 2023-02-07 NOTE — Op Note (Signed)
Surgeon: Surgeon(s): [name], DPM  Assistants: None Pre-operative diagnosis: Acute osteomyelitis of left foot  Post-operative diagnosis: same Procedure: Procedure(s) (LRB): AMPUTATION TOE 3RD,4TH, and 5TH (Left)  Pathology:  ID Type Source Tests Collected by Time Destination  1 : 3rd, 4th, 5th toes Tissue PATH Digit amputation SURGICAL PATHOLOGY [name], DPM 02/07/2023 1234     Pertinent Intra-op findings: OM noted of the third digit  Anesthesia: General  Hemostasis:  Total Tourniquet Time Documented: area (laterality) - 12 minutes Total: area (laterality) - 12 minutes  EBL: 3 mL  Materials: 3-0 Prolene and skin staple Injectables: 10 cc half percent Marcaine plain Complications: None  Indications for surgery: A 87 y.o. male presents with left third digit osteomyelitis. Patient has failed all conservative therapy including but not limited to local wound care antibiotics. He wishes to have surgical correction of the foot/deformity. It was determined that patient would benefit from left third fourth and fifth digit amputation from a functional standpoint given that he has loss of hallux and second digit is only a matter time before he breaks down fourth and fifth digit. Informed surgical risk consent was reviewed and read aloud to the patient.  I reviewed the films.  I have discussed my findings with the patient in great detail.  I have discussed all risks including but not limited to infection, stiffness, scarring, limp, disability, deformity, damage to blood vessels and nerves, numbness, poor healing, need for braces, arthritis, chronic pain, amputation, death.  All benefits and realistic expectations discussed in great detail.  I have made no promises as to the outcome.  I have provided realistic expectations.  I have offered the patient a 2nd opinion, which they have declined and assured me they preferred to proceed despite the risks   Procedure in detail: The patient was  both verbally and visually identified by myself, the nursing staff, and anesthesia staff in the preoperative holding area. They were then transferred to the operating room and placed on the operative table in supine position.  Using skin marker fishmouth style incision was delineated circumferential around the third fourth and fifth digit.  Using #15 blade incision was carried down from dermal junction down to the level of the bone disarticulated at the third fourth and fifth metatarsal phalangeal joint respectively.  This was passed off to pathology.  No further infection noted.  The wound was thoroughly irrigated with 3 L normal saline solution.  All planes were explored no further purulent drainage noted no signs of infection noted.  The wound was primarily closed with 3-0 Prolene and skin staple.  The wound was dressed with Xeroform Curlex 4 x 4 gauze Ace bandage.  All bony prominences were adequately padded tourniquet was deflated total time of 7 minutes.  At the conclusion of the procedure the patient was awoken from anesthesia and found to have tolerated the procedure well any complications. There were transferred to PACU with vital signs stable and vascular status intact.  Nicholes Rough, DPM

## 2023-02-07 NOTE — Anesthesia Postprocedure Evaluation (Signed)
Anesthesia Post Note  Patient: Micheal Walls  Procedure(s) Performed: AMPUTATION TOE 3RD,4TH, and 5TH (Left)     Patient location during evaluation: PACU Anesthesia Type: MAC Level of consciousness: awake and alert Pain management: pain level controlled Vital Signs Assessment: post-procedure vital signs reviewed and stable Respiratory status: spontaneous breathing, nonlabored ventilation, respiratory function stable and patient connected to nasal cannula oxygen Cardiovascular status: stable and blood pressure returned to baseline Postop Assessment: no apparent nausea or vomiting Anesthetic complications: no  No notable events documented.  Last Vitals:  Vitals:   02/07/23 0715 02/07/23 1302  BP: (!) 119/51 (!) 129/54  Pulse: 73 71  Resp: 18 17  Temp: 36.7 C 36.7 C  SpO2: 98% 99%    Last Pain:  Vitals:   02/07/23 1302  TempSrc: Oral  PainSc:                  [name]

## 2023-02-07 NOTE — H&P (View-Only) (Signed)
CCC Pre-op Review  Pre-op checklist: completed  NPO: yes since MN 8/27  Labs: WNL, CBG 120 8/27 @0714   Consent: asked RN to obtain   H&P: consult note 8/26-Patel  Vitals: WNL  O2 requirements: 98% RA  MAR/PTA review: completed ASA 81 given at 0836 Rocephin IV due at 1530 Flagy IV due at 1530  IV: 18g RUA  Floor nurse name:  Olene Craven RN 5N  Additional info:

## 2023-02-07 NOTE — Discharge Summary (Addendum)
Physician Discharge Summary  Micheal Walls QIO:[id number] DOB: 1935-11-13 DOA: 02/05/2023  PCP: [name], [name]  Admit date: 02/05/2023  Discharge date: 02/07/2023  Admitted From: Home.  Disposition:  Home  Recommendations for Outpatient Follow-up:  Follow up with PCP in 1-2 weeks. Please obtain BMP/CBC in one week Advised to follow-up with Dr. Nicholes Rough in 1 week. Advised to take doxycycline 100 mg twice daily for 10 days. Advised partial nonweightbearing on the left lower extremity with boot. Advised not to remove dressing until follow-up in the clinic  Home Health:None Equipment/Devices:None  Discharge Condition: Stable CODE STATUS:Full code Diet recommendation: Heart Healthy  Brief [facility] Course: This 87 y.o. male with PMH significant of DM-2, PVD, CVA, diastolic CHF, CKD, HTN, BPH, right BKA, left first and second toe amputation, gout and obesity presenting with purulent drainage from his left third toe after he accidentally hit it.  Patient reports pain with weightbearing.  ED workup reveals serum creatinine 2.23 up from baseline 1.9.  X-ray left foot concerning for osteomyelitis of left third toe and edema in his left foot,  podiatry was consulted recommended ABI, MRI and started on empiric antibiotics (ceftriaxone and Flagyl) MRI confirmed osteomyelitis of the phalanx of the third toe.  Patient underwent left third digit amputation tolerated well.  Patient felt better.  Podiatry recommended patient can be discharged on doxycycline 100 mg twice daily.  Advised not to remove dressing until follow-up in the clinic.  Patient want to be discharged agreeable with podiatry.  Discharge Diagnoses:  Principal Problem:   Acute osteomyelitis of left foot (HCC) Active Problems:   CKD (chronic kidney disease), stage III (HCC)   Essential hypertension   Chronic diastolic (congestive) heart failure (HCC)   History of stroke   Gout   OSA on CPAP   Morbid obesity  (HCC)   Anemia   History of peripheral vascular disease   Diabetic foot infection (HCC)  Acute osteomyelitis of left third toe:  Patient presented with purulent drainage for days after injury.   Patient is diabetic.  X-ray and MRI concerning for osteomyelitis.  ABI with noncompressible DPA.  -Podiatry, Dr. Lilian Kapur consulted and recommended ABI, MRI and IV antibiotics -CRP 3.7, ESR 70, lactic acid 1.7 -Blood cultures no growth so far -Continue antibiotics with ceftriaxone and Flagyl. -Patient underwent left third toe amputation.  Tolerated well. -As per Dr. Nicholes Rough patient can be discharged on doxycycline 100 mg twice daily for 10 days.  -Partial nonweightbearing left foot with boot.    Non-insulin-dependent diabetes:  HbA1c 6.3% on 11/24/2022.  Seems to be on Ozempic -CBG monitoring and SSI-sensitive.   Chronic diastolic CHF:  TTE in 07/2020 with LVEF of 55 to 60%, moderate LVH.  Seems to be on p.o. Lasix at home.   Appears euvolemic on exam.  No cardiopulmonary symptoms. Continue home Lasix. Strict intake and output, daily weight, renal functions and electrolytes   CKD-3B: Baseline Cr ~1.9-2.1. Serum creatinine at baseline.  Avoid nephrotoxic medications.   History of CVA/hyperlipidemia: Continue home meds.   OSA on CPAP: Continue CPAP at night   Hypothyroidism: -Continue Synthroid   History of gout: -Continue home meds   BPH without LUTS: -Continue home Flomax.   Anemia:  Hgb 10.4.  Baseline seems to be 11-12.   Denies melena or hematochezia.     Glaucoma: -Continue home eyedrops.    Discharge Instructions  Discharge Instructions     Call MD for:  difficulty breathing, headache or visual disturbances  Complete by: As directed    Call MD for:  persistant dizziness or light-headedness   Complete by: As directed    Call MD for:  persistant nausea and vomiting   Complete by: As directed    Diet - low sodium heart healthy   Complete by: As directed     Diet Carb Modified   Complete by: As directed    Discharge instructions   Complete by: As directed    Advised to follow-up with primary care physician in 1 week. Advised to follow-up with Dr. Allena Katz, Caryn Bee in 1 week. Advised to take doxycycline 100 mg twice daily for 10 days. Advised partial nonweightbearing on the left lower extremity. Advised not to remove dressing until follow-up in the clinic   Increase activity slowly   Complete by: As directed       Allergies as of 02/07/2023       Reactions   Vancomycin Rash        Medication List     TAKE these medications    allopurinol 100 MG tablet Commonly known as: ZYLOPRIM Take 100 mg by mouth 2 (two) times daily.   aspirin EC 81 MG tablet Take 81 mg by mouth daily. Swallow whole.   Castellani Paint 1.5 % Liqd Apply between toes daily   colchicine 0.6 MG tablet Take 0.6 mg by mouth once a week.   diltiazem 240 MG 24 hr capsule Commonly known as: CARDIZEM CD TAKE ONE CAPSULE BY MOUTH ONCE DAILY   dorzolamide 2 % ophthalmic solution Commonly known as: TRUSOPT Place 1 drop into both eyes 2 (two) times daily.   doxycycline 100 MG tablet Commonly known as: ADOXA Take 1 tablet (100 mg total) by mouth 2 (two) times daily for 10 days.   ferrous sulfate 325 (65 FE) MG tablet Take 1 tablet (325 mg total) by mouth 2 (two) times daily with a meal.   FreeStyle Libre 14 Day Sensor Misc Use as directed to check blood sugars   FreeStyle Precision Neo Test test strip Generic drug: glucose blood Use as instructed   furosemide 40 MG tablet Commonly known as: LASIX Take 1 tablet (40 mg total) by mouth 2 (two) times daily.   gabapentin 100 MG capsule Commonly known as: NEURONTIN TAKE TWO CAPSULES BY MOUTH EVERY MORNING, TAKE ONE CAPSULE BY MOUTH AT NOON, TAKE ONE CAPSULE BY MOUTH EVERY EVENING   latanoprost 0.005 % ophthalmic solution Commonly known as: XALATAN Place 1 drop into both eyes at bedtime.    oxyCODONE-acetaminophen 5-325 MG tablet Commonly known as: Percocet Take 1 tablet by mouth every 4 (four) hours as needed for up to 3 days for severe pain.   Ozempic ([name]) 2 MG/1.5ML Sopn Generic drug: Semaglutide ([name]) Inject 1 mg into the skin once a week.   pantoprazole 40 MG tablet Commonly known as: PROTONIX TAKE ONE TABLET BY MOUTH EVERYDAY AT BEDTIME   PreserVision AREDS Caps Take 2 capsules by mouth daily.   multivitamin with minerals tablet Take 1 tablet by mouth daily.   rosuvastatin 20 MG tablet Commonly known as: CRESTOR TAKE ONE TABLET BY MOUTH EVERYDAY AT BEDTIME What changed: See the new instructions.   senna-docusate 8.6-50 MG tablet Commonly known as: Senokot-S Take 1 tablet by mouth 2 (two) times daily between meals as needed for mild constipation or moderate constipation. What changed:  when to take this reasons to take this   Synthroid 25 MCG tablet Generic drug: levothyroxine Take 1 tablet by mouth  Monday - Friday   tamsulosin 0.4 MG Caps capsule Commonly known as: FLOMAX Take 0.4 mg by mouth daily.        Follow-up Information     [name], [name] Follow up in 1 week(s).   Specialty: General Practice Contact information: [address] [place] [address] [phone]         [name], DPM Follow up in 1 week(s).   Specialty: Podiatry Contact information: [address] [place] [address] [phone]                Allergies  Allergen Reactions   Vancomycin Rash    Consultations: Podiatry   Procedures/Studies: VAS [place] ABI WITH/WO TBI  Result Date: 02/05/2023  LOWER EXTREMITY DOPPLER STUDY Patient Name:  AWS MALLARE  Date of Exam:   02/05/2023 Medical Rec #: [id number]          Accession #:    [id number] Date of Birth: Jul 29, 1935          Patient Gender: M Patient Age:   27 years Exam Location:  [facility] Procedure:      VAS [place] ABI WITH/WO TBI Referring Phys:  JULIE HAVILAND --------------------------------------------------------------------------------  Indications: Ulceration. Osteomyelitis by X-ray  Vascular Interventions: Left anterior tibial artery angioplasty 08/11/2020. Right                         BKA, Amputation of left great toe 09/2020 and left second                         toe 11/2021. Limitations: Today's exam was limited due to Edema/habitus of foot. Comparison Study: Prior ABI 08/11/2022 Performing Technologist: Sherren Kerns RVS  Examination Guidelines: A complete evaluation includes at minimum, Doppler waveform signals and systolic blood pressure reading at the level of bilateral brachial, anterior tibial, and posterior tibial arteries, when vessel segments are accessible. Bilateral testing is considered an integral part of a complete examination. Photoelectric Plethysmograph (PPG) waveforms and toe systolic pressure readings are included as required and additional duplex testing as needed. Limited examinations for reoccurring indications may be performed as noted.  ABI Findings: +--------+------------------+-----+--------+--------+ Right   Rt Pressure (mmHg)IndexWaveformComment  +--------+------------------+-----+--------+--------+ Brachial150                                     +--------+------------------+-----+--------+--------+ PTA                                    BKA      +--------+------------------+-----+--------+--------+ DP                                     BKA      +--------+------------------+-----+--------+--------+ +---------+------------------+-----+----------+------------------------+ Left     Lt Pressure (mmHg)IndexWaveform  Comment                  +---------+------------------+-----+----------+------------------------+ Brachial 157                                                       +---------+------------------+-----+----------+------------------------+  PTA      254               1.62  monophasic                         +---------+------------------+-----+----------+------------------------+ DP       154               0.98 monophasic                         +---------+------------------+-----+----------+------------------------+ Great Toe                                 3rd toe has Doppler flow +---------+------------------+-----+----------+------------------------+ +-------+----------------+----------------+------------------+-----------------+ ABI/TBIToday's ABI     Today's TBI     Previous ABI      Previous TBI      +-------+----------------+----------------+------------------+-----------------+ Right  BKA             BKA             BKA               BKA               +-------+----------------+----------------+------------------+-----------------+ Left   1.62 non        3rd toe has     1.5 non           Great toe                compressible    flow, unable to compressible      amputation                               do pressure                                                                secondary to                                                               small size                                          +-------+----------------+----------------+------------------+-----------------+ Arterial wall calcification precludes accurate ankle pressures and ABIs. Left ABIs appear essentially unchanged compared to prior study on 08/11/22.  Summary: Right: BKA. Left: Resting left ankle-brachial index indicates noncompressible left lower extremity arteries. Left PTA is calcified. Right DP/AT 0.98. 3rd toe has flow detected by Doppler, however unable to obtain toe pressure secondary to small size. *See table(s) above for measurements and observations.  Electronically signed by Coral Else MD on 02/05/2023 at 8:54:49 PM.    Final    MR FOOT LEFT WO CONTRAST  Result Date: 02/05/2023 CLINICAL DATA:  Osteomyelitis of the foot EXAM: MRI OF  THE LEFT FOOT WITHOUT CONTRAST  TECHNIQUE: Multiplanar, multisequence MR imaging of the left forefoot was performed. No intravenous contrast was administered. COMPARISON:  Radiographs 02/05/2023 FINDINGS: Bones/Joint/Cartilage Prior amputation of the first and second toes. Abnormal diffuse marrow edema in the phalanges of the third toe compatible with active osteomyelitis. The proximal and middle phalanges are observed, distal phalanx may be completely resorbed. Ligaments Lisfranc ligament intact Muscles and Tendons Diffuse muscular atrophy. Lax flexor hallucis longus as expected given the amputation. Soft tissues Diffuse subcutaneous edema along the forefoot although most concentrated dorsally. IMPRESSION: 1. Osteomyelitis of the phalanges of the third toe. 2. Prior amputation of the first and second toes. 3. Diffuse muscular atrophy. 4. Diffuse subcutaneous edema along the forefoot although most concentrated dorsally. Electronically Signed   By: Gaylyn Rong M.D.   On: 02/05/2023 17:57   DG Foot Complete Left  Result Date: 02/05/2023 CLINICAL DATA:  Purulent drainage and pain in the left third toe region. EXAM: LEFT FOOT - COMPLETE 3+ VIEW COMPARISON:  12/04/2021 FINDINGS: Prior amputation of the first and second toes. Interval bony destruction of the distal and middle phalanges of the third toe compatible with active osteomyelitis. The distal phalanx is completely demineralized and the middle phalanx is partially demineralized. Extensive atheromatous vascular calcification. Midfoot spurring and spurring at the Lisfranc joint. Large plantar calcaneal spur. Subcutaneous edema circumferentially at the ankle and tracking in the dorsum of the foot. IMPRESSION: 1. Interval bony destructive findings of the distal and middle phalanges of the third toe compatible with active osteomyelitis. 2. Prior amputation of the first and second toes. 3. Extensive atheromatous vascular calcification. 4. Subcutaneous edema  circumferentially at the ankle and tracking in the dorsum of the foot. Electronically Signed   By: Gaylyn Rong M.D.   On: 02/05/2023 14:31     Subjective: Patient was seen and examined at bedside.  Overnight events noted.   Patient is s/p left third digit amputation.  Patient feels much better and  wants to be discharged.  Podiatry is in agreement.  Discharge Exam: Vitals:   02/07/23 0715 02/07/23 1302  BP: (!) 119/51 (!) 129/54  Pulse: 73 71  Resp: 18 17  Temp: 98.1 F (36.7 C) 98 F (36.7 C)  SpO2: 98% 99%   Vitals:   02/06/23 2223 02/07/23 0426 02/07/23 0715 02/07/23 1302  BP:  (!) 126/54 (!) 119/51 (!) 129/54  Pulse: 68 68 73 71  Resp: 16 19 18 17   Temp:  99.3 F (37.4 C) 98.1 F (36.7 C) 98 F (36.7 C)  TempSrc:  Axillary Oral Oral  SpO2: 99% 97% 98% 99%  Weight:      Height:        General: Pt is alert, awake, not in acute distress Cardiovascular: RRR, S1/S2 +, no rubs, no gallops Respiratory: CTA bilaterally, no wheezing, no rhonchi Abdominal: Soft, NT, ND, bowel sounds + Extremities: Right BKA, left third toe amputation    The results of significant diagnostics from this hospitalization (including imaging, microbiology, ancillary and laboratory) are listed below for reference.     Microbiology: Recent Results (from the past 240 hour(s))  Blood Cultures x 2 sites     Status: None (Preliminary result)   Collection Time: 02/05/23  5:53 PM   Specimen: BLOOD  Result Value Ref Range Status   Specimen Description BLOOD BLOOD RIGHT ARM  Final   Special Requests   Final    BOTTLES DRAWN AEROBIC AND ANAEROBIC Blood Culture adequate volume   Culture   Final  NO GROWTH < 24 HOURS Performed at [facility], [address], [place], [place] [address]    Report Status PENDING  Incomplete  Blood Cultures x 2 sites     Status: None (Preliminary result)   Collection Time: 02/05/23  5:54 PM   Specimen: BLOOD  Result Value Ref Range Status   Specimen  Description BLOOD LEFT ANTECUBITAL  Final   Special Requests   Final    BOTTLES DRAWN AEROBIC AND ANAEROBIC Blood Culture adequate volume   Culture   Final    NO GROWTH < 24 HOURS Performed at [facility], [address] [place], [place] [address]    Report Status PENDING  Incomplete  MRSA Next Gen by PCR, Nasal     Status: None   Collection Time: 02/07/23  7:15 AM   Specimen: Nasal Mucosa; Nasal Swab  Result Value Ref Range Status   MRSA by PCR Next Gen NOT DETECTED NOT DETECTED Final    Comment: (NOTE) The GeneXpert MRSA Assay (FDA approved for NASAL specimens only), is one component of a comprehensive MRSA colonization surveillance program. It is not intended to diagnose MRSA infection nor to guide or monitor treatment for MRSA infections. Test performance is not FDA approved in patients less than 63 years old. Performed at [facility], [address], [place], [place] [address]      Labs: BNP (last 3 results) No results for input(s): "BNP" in the last 8760 hours. Basic Metabolic Panel: Recent Labs  Lab 02/05/23 1244 02/06/23 0441 02/07/23 0436  NA 136 139 137  K 4.1 4.0 3.4*  CL 100 104 102  CO2 23 26 26   GLUCOSE 148* 125* 128*  BUN 70* 67* 57*  CREATININE 2.23* 2.25* 2.09*  CALCIUM 9.5 9.5 9.2  MG  --   --  2.3  PHOS  --   --  3.4   Liver Function Tests: Recent Labs  Lab 02/05/23 1244 02/06/23 0441  AST 34 22  ALT 32 29  ALKPHOS 72 65  BILITOT 0.4 0.4  PROT 7.4 6.6  ALBUMIN 3.2* 2.8*   No results for input(s): "LIPASE", "AMYLASE" in the last 168 hours. No results for input(s): "AMMONIA" in the last 168 hours. CBC: Recent Labs  Lab 02/05/23 1244 02/06/23 0441 02/07/23 0436  WBC 5.3 4.7 5.8  NEUTROABS 3.4  --   --   HGB 10.4* 10.2* 9.9*  HCT 31.2* 31.6* 30.6*  MCV 90.2 89.3 89.7  PLT 201 213 219   Cardiac Enzymes: No results for input(s): "CKTOTAL", "CKMB", "CKMBINDEX", "TROPONINI" in the last 168 hours. BNP: Invalid  input(s): "POCBNP" CBG: Recent Labs  Lab 02/06/23 1117 02/06/23 1611 02/06/23 2128 02/07/23 0714 02/07/23 1100  GLUCAP 125* 98 127* 120* 112*   D-Dimer No results for input(s): "DDIMER" in the last 72 hours. Hgb A1c Recent Labs    02/05/23 1244  HGBA1C 6.3*   Lipid Profile No results for input(s): "CHOL", "HDL", "LDLCALC", "TRIG", "CHOLHDL", "LDLDIRECT" in the last 72 hours. Thyroid function studies No results for input(s): "TSH", "T4TOTAL", "T3FREE", "THYROIDAB" in the last 72 hours.  Invalid input(s): "FREET3" Anemia work up No results for input(s): "VITAMINB12", "FOLATE", "FERRITIN", "TIBC", "IRON", "RETICCTPCT" in the last 72 hours. Urinalysis    Component Value Date/Time   COLORURINE YELLOW 10/22/2020 0300   APPEARANCEUR CLEAR 10/22/2020 0300   LABSPEC 1.008 10/22/2020 0300   PHURINE 8.0 10/22/2020 0300   GLUCOSEU NEGATIVE 10/22/2020 0300   HGBUR NEGATIVE 10/22/2020  0300   BILIRUBINUR Negative 08/11/2021 1459   KETONESUR NEGATIVE 10/22/2020 0300   PROTEINUR Positive (A) 08/11/2021 1459   PROTEINUR 30 (A) 10/22/2020 0300   UROBILINOGEN 0.2 08/11/2021 1459   UROBILINOGEN 0.2 01/08/2011 1558   NITRITE Negative 08/11/2021 1459   NITRITE NEGATIVE 10/22/2020 0300   LEUKOCYTESUR Negative 08/11/2021 1459   LEUKOCYTESUR NEGATIVE 10/22/2020 0300   Sepsis Labs Recent Labs  Lab 02/05/23 1244 02/06/23 0441 02/07/23 0436  WBC 5.3 4.7 5.8   Microbiology Recent Results (from the past 240 hour(s))  Blood Cultures x 2 sites     Status: None (Preliminary result)   Collection Time: 02/05/23  5:53 PM   Specimen: BLOOD  Result Value Ref Range Status   Specimen Description BLOOD BLOOD RIGHT ARM  Final   Special Requests   Final    BOTTLES DRAWN AEROBIC AND ANAEROBIC Blood Culture adequate volume   Culture   Final    NO GROWTH < 24 HOURS Performed at [facility], [address], [place], [place] [address]    Report Status PENDING  Incomplete  Blood Cultures x  2 sites     Status: None (Preliminary result)   Collection Time: 02/05/23  5:54 PM   Specimen: BLOOD  Result Value Ref Range Status   Specimen Description BLOOD LEFT ANTECUBITAL  Final   Special Requests   Final    BOTTLES DRAWN AEROBIC AND ANAEROBIC Blood Culture adequate volume   Culture   Final    NO GROWTH < 24 HOURS Performed at [facility], [address] [place], [place] [address]    Report Status PENDING  Incomplete  MRSA Next Gen by PCR, Nasal     Status: None   Collection Time: 02/07/23  7:15 AM   Specimen: Nasal Mucosa; Nasal Swab  Result Value Ref Range Status   MRSA by PCR Next Gen NOT DETECTED NOT DETECTED Final    Comment: (NOTE) The GeneXpert MRSA Assay (FDA approved for NASAL specimens only), is one component of a comprehensive MRSA colonization surveillance program. It is not intended to diagnose MRSA infection nor to guide or monitor treatment for MRSA infections. Test performance is not FDA approved in patients less than 57 years old. Performed at [facility], [address] [place], [place] [address]      Time coordinating discharge: Over 30 minutes  SIGNED:   Willeen Niece, MD  [facility] 02/07/2023, 3:36 PM Pager   If 7PM-7AM, please contact night-coverage

## 2023-02-07 NOTE — Plan of Care (Signed)
  Problem: Activity: Goal: Risk for activity intolerance will decrease Outcome: Progressing   Problem: Nutrition: Goal: Adequate nutrition will be maintained Outcome: Progressing   Problem: Pain Managment: Goal: General experience of comfort will improve Outcome: Progressing   

## 2023-02-07 NOTE — Discharge Planning (Signed)
Patient alert and oriented. Discharge teaching given. IV access removed. Patient transported to lobby for private transportation home.

## 2023-02-07 NOTE — Transfer of Care (Signed)
Immediate Anesthesia Transfer of Care Note  Patient: [name]  Procedure(s) Performed: AMPUTATION TOE 3RD,4TH, and 5TH (Left)  Patient Location: PACU to 5 North  Anesthesia Type:MAC  Level of Consciousness: awake  Airway & Oxygen Therapy: Patient Spontanous Breathing  Post-op Assessment: Report given to RN Albertina Parr, RN  Post vital signs: Reviewed and stable  Last Vitals:  Vitals Value Taken Time  BP    Temp    Pulse    Resp    SpO2      Last Pain:  Vitals:   02/07/23 1144  TempSrc:   PainSc: 0-No pain         Complications: No notable events documented.

## 2023-02-08 ENCOUNTER — Encounter (HOSPITAL_COMMUNITY): Payer: Self-pay | Admitting: Podiatry

## 2023-02-08 ENCOUNTER — Telehealth: Payer: Self-pay

## 2023-02-08 NOTE — Transitions of Care (Post Inpatient/ED Visit) (Signed)
   02/08/2023  Name: Micheal Walls MRN: [id number] DOB: July 10, 1935  Today's TOC FU Call Status: Today's TOC FU Call Status:: Successful TOC FU Call Completed TOC FU Call Complete Date: 02/08/23 Patient's Name and Date of Birth confirmed.  Transition Care Management Follow-up Telephone Call Date of Discharge: 02/07/23 Discharge Facility: [facility]) Type of Discharge: Inpatient Admission Primary Inpatient Discharge Diagnosis:: Acute osteomyelitis of left foot How have you been since you were released from the hospital?: Better Any questions or concerns?: No  Items Reviewed: Did you receive and understand the discharge instructions provided?: Yes Medications obtained,verified, and reconciled?: Yes (Medications Reviewed) Any new allergies since your discharge?: No Dietary orders reviewed?: No Do you have support at home?: Yes  Medications Reviewed Today: Medications Reviewed Today   Medications were not reviewed in this encounter     Home Care and Equipment/Supplies: Were Home Health Services Ordered?: No Any new equipment or medical supplies ordered?: No Were you able to get the equipment/medical supplies?: Yes Do you have any questions related to the use of the equipment/supplies?: Yes  Functional Questionnaire: Do you need assistance with bathing/showering or dressing?: Yes Do you need assistance with meal preparation?: Yes Do you need assistance with eating?: Yes Do you have difficulty maintaining continence: Yes Do you need assistance with getting out of bed/getting out of a chair/moving?: Yes Do you have difficulty managing or taking your medications?: Yes  Follow up appointments reviewed: PCP Follow-up appointment confirmed?: No MD Provider Line Number:[phone] Given: Yes Specialist Hospital Follow-up appointment confirmed?: Yes Date of Specialist follow-up appointment?: 02/20/23 Follow-Up Specialty Provider:: PODIATRY Do you need transportation to your  follow-up appointment?: No Do you understand care options if your condition(s) worsen?: Yes-patient verbalized understanding    SIGNATURE Lisabeth Devoid, CMA

## 2023-02-10 LAB — CULTURE, BLOOD (ROUTINE X 2)
Culture: NO GROWTH
Culture: NO GROWTH
Special Requests: ADEQUATE
Special Requests: ADEQUATE

## 2023-02-13 ENCOUNTER — Other Ambulatory Visit: Payer: Self-pay | Admitting: Nurse Practitioner

## 2023-02-13 DIAGNOSIS — I739 Peripheral vascular disease, unspecified: Secondary | ICD-10-CM

## 2023-02-13 DIAGNOSIS — G629 Polyneuropathy, unspecified: Secondary | ICD-10-CM

## 2023-02-14 ENCOUNTER — Other Ambulatory Visit: Payer: Self-pay | Admitting: Podiatry

## 2023-02-14 ENCOUNTER — Encounter: Payer: Self-pay | Admitting: Podiatry

## 2023-02-14 ENCOUNTER — Ambulatory Visit: Payer: Self-pay

## 2023-02-14 DIAGNOSIS — M869 Osteomyelitis, unspecified: Secondary | ICD-10-CM

## 2023-02-14 NOTE — Patient Outreach (Signed)
  Care Coordination   Follow Up Visit Note   02/14/2023 Name: Micheal Walls MRN: [id number] DOB: 02-02-1936  Micheal Walls is a 87 y.o. year old male who sees Micheal Felts, [name] for primary care. I  spoke with [name] by phone to assist with care coordination needs.  What matters to the patients health and wellness today?  Establish services for wound care in the home.   SDOH assessments and interventions completed:  No     Care Coordination Interventions:  Yes, provided   Interventions Today    Flowsheet Row Most Recent Value  Chronic Disease   Chronic disease during today's visit Diabetes, Other  [toe amputation]  General Interventions   General Interventions Discussed/Reviewed General Interventions Discussed, Communication with  [Discussed pt had recent toe amputation. Daughter would like assistance with wound care. Instructed daughter to speak with patients provider to request orders for home health]  Communication with RN  [Communication with RN Care Manager Micheal Walls to advise of patients recent amputation and plans to follow up with provider to request orders for home health]        Follow up plan: No further intervention required.   Encounter Outcome:  Pt. Visit Completed   Micheal Walls, [name], [name], Certified Dementia Practitioner [facility] Care Management  Care Coordination [phone]

## 2023-02-14 NOTE — Patient Instructions (Signed)
Visit Information  Thank you for taking time to visit with me today. Please don't hesitate to contact me if I can be of assistance to you.   Following are the goals we discussed today:  - Speak with your provider to request orders for home health  If you are experiencing a Mental Health or Behavioral Health Crisis or need someone to talk to, please call 1-800-273-TALK (toll free, 24 hour hotline) go to [facility] [address] [place] [phone]) call 911  Patient verbalizes understanding of instructions and care plan provided today and agrees to view in MyChart. Active MyChart status and patient understanding of how to access instructions and care plan via MyChart confirmed with patient.     No further follow up required: Please contact me as needed.  Bevelyn Ngo, [name], [name], Certified Dementia Practitioner [facility] Care Management  Care Coordination [phone]

## 2023-02-14 NOTE — Progress Notes (Unsigned)
Home health orders placed.

## 2023-02-16 ENCOUNTER — Other Ambulatory Visit: Payer: Self-pay | Admitting: Podiatry

## 2023-02-16 ENCOUNTER — Ambulatory Visit (INDEPENDENT_AMBULATORY_CARE_PROVIDER_SITE_OTHER): Payer: Medicare Other | Admitting: Podiatry

## 2023-02-16 DIAGNOSIS — Z89422 Acquired absence of other left toe(s): Secondary | ICD-10-CM

## 2023-02-16 LAB — SURGICAL PATHOLOGY

## 2023-02-16 NOTE — Progress Notes (Signed)
  Subjective:  Patient ID: Micheal Walls, male    DOB: Aug 08, 1935,  MRN: [id number]  Chief Complaint  Patient presents with   Routine Post Op    hospital pos op left 3rd,4th,5th toe amputation/ dos 02/07/23,patient denies pain today    DOS: 02/07/2023 Procedure: Amputation of left third foot and fifth toe  87 y.o. male returns for post-op check.  Patient presents for first postop check approximately 9 days status post left third fourth and fifth toe amputation.  Patient has all toes amputated on left foot at this time.  Previously had the first and second toes amputated.  Denies any issues since surgery has kept the dressing clean dry and intact as is instructed and has been wearing postop shoe on the left foot  Review of Systems: Negative except as noted in the HPI. Denies N/V/F/Ch.   Objective:  There were no vitals filed for this visit. There is no height or weight on file to calculate BMI. Constitutional Well developed. Well nourished.  Vascular Foot warm and well perfused. Capillary refill normal to all digits.  Calf is soft and supple, no posterior calf or knee pain, negative Homans' sign  Neurologic Normal speech. Oriented to person, place, and time. Epicritic sensation to light touch grossly present bilaterally.  Dermatologic Skin healing well without signs of infection. Skin edges well coapted without signs of infection.  No maceration erythema or drainage from the amputation site  Orthopedic: No tenderness to palpation noted about the surgical site.   Multiple view plain film radiographs: Deferred Assessment:   1. Status post amputation of lesser toe of left foot (HCC)    Plan:  Patient was evaluated and treated and all questions answered.  S/p foot surgery left, amputation of remaining lesser digits on the left foot -Progressing as expected post-operatively.  Cavitation site healing well without issue at this time -XR: Deferred at this visit -WB Status:  Weightbearing as tolerated in postop shoe -Sutures: To remain intact until next postop appointment. -Medications: Finish antibiotics as previously prescribed patient is taking doxycycline twice daily -Foot redressed.  Keep dressing clean dry and intact until next appointment  Return in about 1 week (around 02/23/2023) for POV L foot 3-5 toe amp.         Corinna Gab, DPM [facility] / [facility]

## 2023-02-17 ENCOUNTER — Other Ambulatory Visit: Payer: Self-pay | Admitting: Podiatry

## 2023-02-24 ENCOUNTER — Ambulatory Visit (INDEPENDENT_AMBULATORY_CARE_PROVIDER_SITE_OTHER): Payer: Medicare Other | Admitting: Podiatry

## 2023-02-24 DIAGNOSIS — Z89422 Acquired absence of other left toe(s): Secondary | ICD-10-CM

## 2023-02-24 DIAGNOSIS — M869 Osteomyelitis, unspecified: Secondary | ICD-10-CM

## 2023-02-24 NOTE — Progress Notes (Signed)
Subjective:  Patient ID: Micheal Walls, male    DOB: 1935-11-01,  MRN: [id number]  Chief Complaint  Patient presents with   Routine Post Op    DOS: 02/07/2023 Procedure: Amputation of left third foot and fifth toe  87 y.o. male returns for post-op check.  Patient presents with his postop check.  He states is doing okay.  No acute complaints.  He denies any nausea fever chills vomiting is noted Dr. Annamary Rummage Review of Systems: Negative except as noted in the HPI. Denies N/V/F/Ch.   Objective:  There were no vitals filed for this visit. There is no height or weight on file to calculate BMI. Constitutional Well developed. Well nourished.  Vascular Foot warm and well perfused. Capillary refill normal to all digits.  Calf is soft and supple, no posterior calf or knee pain, negative Homans' sign  Neurologic Normal speech. Oriented to person, place, and time. Epicritic sensation to light touch grossly present bilaterally.  Dermatologic Skin healing well without signs of infection. Skin edges well coapted without signs of infection.  No maceration erythema or drainage from the amputation site  Orthopedic: No tenderness to palpation noted about the surgical site.   Multiple view plain film radiographs: Deferred Assessment:   No diagnosis found.  Plan:  Patient was evaluated and treated and all questions answered.  S/p foot surgery left, amputation of remaining lesser digits on the left foot -Progressing as expected post-operatively.  Cavitation site healing well without issue at this time -XR: Deferred at this visit -WB Status: Weightbearing as tolerated in postop shoe -Sutures: Sutures are not ready to come out.  We will see Dr. Annamary Rummage in 2 weeks for removal. -Medications: None -Foot redressed.  Keep dressing clean dry and intact until next appointment  No follow-ups on file.

## 2023-03-10 ENCOUNTER — Encounter: Payer: Self-pay | Admitting: Podiatry

## 2023-03-10 ENCOUNTER — Ambulatory Visit (INDEPENDENT_AMBULATORY_CARE_PROVIDER_SITE_OTHER): Payer: Medicare Other | Admitting: Podiatry

## 2023-03-10 DIAGNOSIS — Z89422 Acquired absence of other left toe(s): Secondary | ICD-10-CM | POA: Diagnosis not present

## 2023-03-10 DIAGNOSIS — M869 Osteomyelitis, unspecified: Secondary | ICD-10-CM

## 2023-03-10 NOTE — Progress Notes (Addendum)
Subjective:  Patient ID: Micheal Walls, male    DOB: 10-Apr-1936,  MRN: [id number]  Chief Complaint  Patient presents with   Routine Post Op    2 week F/U for 3rd and 5th toe on left foot    DOS: 02/07/2023 Procedure: Amputation of left third foot and fifth toe  87 y.o. male returns for post-op check.  Now 1 month post op.  He states is doing okay.  No acute complaints.  Has been weightbearing as tolerated in post op shoe.   Review of Systems: Negative except as noted in the HPI. Denies N/V/F/Ch.   Objective:  There were no vitals filed for this visit. There is no height or weight on file to calculate BMI. Constitutional Well developed. Well nourished.  Vascular Foot warm and well perfused. Capillary refill normal to all digits.  Calf is soft and supple, no posterior calf or knee pain, negative Homans' sign  Neurologic Normal speech. Oriented to person, place, and time. Epicritic sensation to light touch grossly present bilaterally.  Dermatologic Skin healing well without signs of infection. Skin edges well coapted without signs of infection.  No maceration erythema or drainage from the amputation site  Orthopedic: No tenderness to palpation noted about the surgical site.   Multiple view plain film radiographs: Deferred Assessment:   1. Status post amputation of lesser toe of left foot (HCC)   2. Toe osteomyelitis, left in setting of PAD and T2DM      Plan:  Patient was evaluated and treated and all questions answered.  S/p foot surgery left, amputation of remaining lesser digits on the left foot -Progressing as expected post-operatively.   -His amputation site appears to be fully healed at this time -XR: Deferred at this visit -WB Status: Weightbearing as tolerated in postop shoe for another 2 weeks -Sutures: Sutures and staples removed in total at this visit wound is healed -Medications: None -Does not need to have a bulky dressing on the foot at this  time-recommend Ace wrap for swelling as need -patient is now able to get the foot wet in the shower and wash with warm soapy water then dry after   Return in about 4 weeks (around 04/07/2023) for Left toes 3-5 amputation f/u.

## 2023-03-27 ENCOUNTER — Ambulatory Visit (INDEPENDENT_AMBULATORY_CARE_PROVIDER_SITE_OTHER): Payer: Medicare Other | Admitting: Nurse Practitioner

## 2023-03-27 ENCOUNTER — Encounter: Payer: Self-pay | Admitting: Nurse Practitioner

## 2023-03-27 VITALS — BP 120/60 | HR 81 | Temp 98.5°F | Ht 73.0 in | Wt 250.0 lb

## 2023-03-27 DIAGNOSIS — I5032 Chronic diastolic (congestive) heart failure: Secondary | ICD-10-CM

## 2023-03-27 DIAGNOSIS — E1122 Type 2 diabetes mellitus with diabetic chronic kidney disease: Secondary | ICD-10-CM | POA: Diagnosis not present

## 2023-03-27 DIAGNOSIS — E11319 Type 2 diabetes mellitus with unspecified diabetic retinopathy without macular edema: Secondary | ICD-10-CM | POA: Diagnosis not present

## 2023-03-27 DIAGNOSIS — N184 Chronic kidney disease, stage 4 (severe): Secondary | ICD-10-CM

## 2023-03-27 DIAGNOSIS — E1159 Type 2 diabetes mellitus with other circulatory complications: Secondary | ICD-10-CM | POA: Diagnosis not present

## 2023-03-27 DIAGNOSIS — E782 Mixed hyperlipidemia: Secondary | ICD-10-CM

## 2023-03-27 DIAGNOSIS — E1142 Type 2 diabetes mellitus with diabetic polyneuropathy: Secondary | ICD-10-CM | POA: Diagnosis not present

## 2023-03-27 DIAGNOSIS — I739 Peripheral vascular disease, unspecified: Secondary | ICD-10-CM

## 2023-03-27 DIAGNOSIS — Z794 Long term (current) use of insulin: Secondary | ICD-10-CM | POA: Diagnosis not present

## 2023-03-27 DIAGNOSIS — G4733 Obstructive sleep apnea (adult) (pediatric): Secondary | ICD-10-CM | POA: Diagnosis not present

## 2023-03-27 DIAGNOSIS — I110 Hypertensive heart disease with heart failure: Secondary | ICD-10-CM

## 2023-03-27 DIAGNOSIS — Z23 Encounter for immunization: Secondary | ICD-10-CM | POA: Diagnosis not present

## 2023-03-27 DIAGNOSIS — I130 Hypertensive heart and chronic kidney disease with heart failure and stage 1 through stage 4 chronic kidney disease, or unspecified chronic kidney disease: Secondary | ICD-10-CM

## 2023-03-27 DIAGNOSIS — E1151 Type 2 diabetes mellitus with diabetic peripheral angiopathy without gangrene: Secondary | ICD-10-CM | POA: Diagnosis not present

## 2023-03-27 DIAGNOSIS — R109 Unspecified abdominal pain: Secondary | ICD-10-CM | POA: Diagnosis not present

## 2023-03-27 MED ORDER — OZEMPIC (2 MG/DOSE) 8 MG/3ML ~~LOC~~ SOPN: 2.0000 mg | PEN_INJECTOR | SUBCUTANEOUS | 1 refills | Status: DC

## 2023-03-27 NOTE — Progress Notes (Signed)
Madelaine Bhat, CMA,acting as a Neurosurgeon for [name], [name].,have documented all relevant documentation on the behalf of [name], [name],as directed by  [name], [name] while in the presence of [name], [name].  Subjective:  Patient ID: Micheal Walls , male    DOB: 1935-12-21 , 87 y.o.   MRN: [id number]  Chief Complaint  Patient presents with   Hypertension    HPI  Patient presents today for a bp, chol, and dm follow up, Patient reports compliance with medication. Patient denies any chest pain, SOB, or headaches. Patient has no concerns today. He has had his 3 remaining toes on his left foot amputated - all healed per patient. He will occasionally get a pain underneath his right arm started about one month ago. The pain will shoot through and then go away for several days. He has a new Hover around. He does not like this one due to not having a back rest.   Diabetes He presents for his follow-up diabetic visit. He has type 2 diabetes mellitus. His disease course has been improving. Pertinent negatives for hypoglycemia include no confusion, dizziness, headaches or nervousness/anxiousness. There are no diabetic associated symptoms. Pertinent negatives for diabetes include no fatigue, no polydipsia, no polyphagia and no polyuria. There are no hypoglycemic complications. Symptoms are improving. Diabetic complications include peripheral neuropathy, PVD and retinopathy. Pertinent negatives for diabetic complications include no autonomic neuropathy. Risk factors for coronary artery disease include sedentary lifestyle and obesity. Current diabetic treatment includes oral agent (monotherapy) (no longer on Tresiba since December). He is compliant with treatment all of the time. His weight is stable. He is following a diabetic diet. When asked about meal planning, he reported none. He has not had a previous visit with a dietitian. He rarely participates in exercise. His home blood glucose trend is  decreasing steadily. (Average 130's blood sugars. Has not  missed any medications. ) An ACE inhibitor/angiotensin II receptor blocker is being taken. He sees a podiatrist (Dr Donzetta Matters - continues to be followed for his left toe amputation 2nd toe).Eye exam is current (Dr. Luciana Axe 06/24/2021).     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 Swallow whole., Disp: , Rfl:    Castellani Paint 1.5 % LIQD, Apply between toes daily, Disp: 30 mL, Rfl: 0   colchicine 0.6 MG tablet, Take 0.6 mg by mouth once a week., Disp: , Rfl:    Continuous Glucose Sensor (FREESTYLE LIBRE 14 DAY SENSOR) MISC, Use as directed to check blood sugars, Disp: 6 each, Rfl: 1   diltiazem (CARDIZEM CD) 240 MG 24 hr capsule, TAKE ONE CAPSULE BY MOUTH ONCE DAILY, Disp: 90 capsule, Rfl: 3  dorzolamide (TRUSOPT) 2 % ophthalmic solution, Place 1 drop into both eyes 2 (two) times daily., Disp: , Rfl:    ferrous sulfate 325 (65 FE) MG tablet, Take 1 tablet (325 mg total) by mouth 2 (two) times daily with a meal., Disp: , Rfl: 3   furosemide (LASIX) 40 MG tablet, TAKE ONE (1) TABLET BY MOUTH TWICE DAILY *REFILL REQUEST*, Disp: 60 tablet, Rfl: 10   gabapentin (NEURONTIN) 100 MG capsule, TAKE TWO (2) CAPSULES BY MOUTH IN THE MORNING, TAKE 1 CAPSULE AT NOON AND 1 CAPSULE EVERY EVENING *REFILL REQUEST*, Disp: 150 capsule, Rfl: 10    gentamicin ointment (GARAMYCIN) 0.1 %, APPLY TOPICALLY THREE TIMES A DAY, Disp: 15 g, Rfl: 10   glucose blood (FREESTYLE PRECISION NEO TEST) test strip, Use as instructed, Disp: 100 each, Rfl: 12   latanoprost (XALATAN) 0.005 % ophthalmic solution, Place 1 drop into both eyes at bedtime. , Disp: , Rfl:    Multiple Vitamins-Minerals (MULTIVITAMIN WITH MINERALS) tablet, Take 1 tablet by mouth daily., Disp: , Rfl:    Multiple Vitamins-Minerals (PRESERVISION AREDS) CAPS, Take 2 capsules by mouth daily., Disp: , Rfl:    pantoprazole (PROTONIX) 40 MG tablet, TAKE 1 TABLET BY MOUTH EVERY DAY AT BEDTIME *REFILL REQUEST*, Disp: 30 tablet, Rfl: 10   rosuvastatin (CRESTOR) 20 MG tablet, TAKE ONE TABLET BY MOUTH EVERYDAY AT BEDTIME (Patient taking differently: Take 20 mg by mouth daily.), Disp: 90 tablet, Rfl: 3   Semaglutide, 2 MG/DOSE, (OZEMPIC, 2 MG/DOSE,) 8 MG/3ML SOPN, Inject 2 mg into the skin once a week., Disp: 9 mL, Rfl: 1   senna-docusate (SENOKOT-S) 8.6-50 MG tablet, Take 1 tablet by mouth 2 (two) times daily between meals as needed for mild constipation or moderate constipation. (Patient taking differently: Take 1 tablet by mouth 2 (two) times daily as needed (constipation).), Disp: 60 tablet, Rfl: 0   SYNTHROID 25 MCG tablet, Take 1 tablet by mouth Monday - Friday, Disp: 60 tablet, Rfl: 2   tamsulosin (FLOMAX) 0.4 MG CAPS capsule, Take 0.4 mg by mouth daily., Disp: , Rfl:    Allergies  Allergen Reactions   Vancomycin Rash     Review of Systems  Constitutional:  Negative for fatigue.  Endocrine: Negative for polydipsia, polyphagia and polyuria.  Neurological:  Negative for dizziness and headaches.  Psychiatric/Behavioral:  Negative for confusion. The patient is not nervous/anxious.      Today's Vitals   03/27/23 1029  BP: 120/60  Pulse: 81  Temp: 98.5 F (36.9 C)  TempSrc: Oral  Weight: 250 lb (113.4 kg)  Height: 6\' 1"  (1.854 m)  PainSc: 0-No pain   Body mass index is 32.98 kg/m.   Wt Readings from Last 3 Encounters:  03/27/23 250 lb (113.4 kg)  02/05/23 250 lb (113.4 kg)  11/24/22 253 lb (114.8 kg)    The ASCVD Risk score (Arnett DK, et al., 2019) failed to calculate for the following reasons:   The 2019 ASCVD risk score is only valid for ages 3 to 61   The patient has a prior MI or stroke diagnosis  Objective:  Physical Exam Vitals reviewed.  Constitutional:      General: He is not in acute distress.    Appearance: Normal appearance. He is obese.  Cardiovascular:     Rate and Rhythm: Normal rate and regular rhythm.     Pulses: Normal pulses.     Heart sounds: Normal heart sounds. No murmur heard. Pulmonary:     Effort: Pulmonary effort is normal.  No respiratory distress.     Breath sounds: Normal breath sounds. No wheezing.  Abdominal:     General: Abdomen is flat. Bowel sounds are normal. There is no distension.     Palpations: Abdomen is soft. There is no mass.     Tenderness: There is abdominal tenderness (right upper quadrant).  Musculoskeletal:     Right upper leg: Normal.     Left upper leg: Normal.     Left lower leg: Swelling present.     Left ankle: Decreased range of motion.     Comments: Uses motorized wheelchair/scooter     Right Lower Extremity: Right leg is amputated below knee.  Skin:    General: Skin is warm and dry.     Capillary Refill: Capillary refill takes less than 2 seconds.  Neurological:     General: No focal deficit present.     Mental Status: He is alert and oriented to person, place, and time.     Cranial Nerves: Cranial nerves 2-12 are intact.  Psychiatric:        Mood and Affect: Mood normal.        Behavior: Behavior normal.        Thought Content: Thought content normal.        Judgment: Judgment normal.         Assessment And Plan:  Type 2 diabetes mellitus with stage 4 chronic kidney disease, with long-term current use of insulin (HCC) Assessment & Plan: hgbA1c was slightly increased at last visit.  Continue current medications.   Orders: -     Ozempic (2 MG/DOSE); Inject 2 mg into the skin once a week.  Dispense: 9 mL; Refill: 1 -     CMP14+EGFR  Mixed hyperlipidemia Assessment & Plan: Cholesterol levels have stable.  Continue statin  Orders: -     Lipid panel -     CMP14+EGFR  Hypertensive heart disease with chronic diastolic congestive heart failure (HCC) Assessment & Plan: Blood pressure is well-controlled.  Continue current medications.   Need for influenza vaccination Assessment & Plan: Influenza vaccine administered Encouraged to take Tylenol as needed for fever or muscle aches.   Orders: -     Flu Vaccine Trivalent High Dose (Fluad)  COVID-19 vaccine administered Assessment & Plan: Covid 19 vaccine given in office observed for 15 minutes without any adverse reaction   Orders: Best boy Vaccine 19yrs & older  PAD (peripheral artery disease) (HCC) Assessment & Plan: Continue statin.   OSA on CPAP Assessment & Plan: He is doing well on his CPAP.  Has good quality of life while using.   Right sided abdominal pain Assessment & Plan: Will check amylase and lipase. Tenderness to right upper abdomen  Orders: -     Amylase -     CMP14+EGFR -     Lipase    Return for controlled DM check 4 months.  Patient was given opportunity to ask questions. Patient verbalized understanding of the plan and was able to repeat key elements of the plan. All questions were answered to their satisfaction.   Jeanell Sparrow, [name], have reviewed all documentation for this visit. The documentation on 03/27/23 for the exam, diagnosis, procedures, and orders are all accurate and complete.   IF YOU HAVE BEEN REFERRED TO A SPECIALIST, IT MAY TAKE 1-2 WEEKS TO SCHEDULE/PROCESS THE REFERRAL. IF YOU HAVE NOT HEARD FROM US/SPECIALIST IN TWO WEEKS, PLEASE GIVE [place] A CALL AT [phone] X  [phone].

## 2023-03-28 LAB — CMP14+EGFR
ALT: 37 [IU]/L (ref 0–44)
AST: 35 [IU]/L (ref 0–40)
Albumin: 3.7 g/dL (ref 3.7–4.7)
Alkaline Phosphatase: 99 [IU]/L (ref 44–121)
BUN/Creatinine Ratio: 30 — ABNORMAL HIGH (ref 10–24)
BUN: 70 mg/dL — ABNORMAL HIGH (ref 8–27)
Bilirubin Total: 0.2 mg/dL (ref 0.0–1.2)
CO2: 24 mmol/L (ref 20–29)
Calcium: 9.8 mg/dL (ref 8.6–10.2)
Chloride: 102 mmol/L (ref 96–106)
Creatinine, Ser: 2.34 mg/dL — ABNORMAL HIGH (ref 0.76–1.27)
Globulin, Total: 3.3 g/dL (ref 1.5–4.5)
Glucose: 122 mg/dL — ABNORMAL HIGH (ref 70–99)
Potassium: 4 mmol/L (ref 3.5–5.2)
Sodium: 140 mmol/L (ref 134–144)
Total Protein: 7 g/dL (ref 6.0–8.5)
eGFR: 27 mL/min/{1.73_m2} — ABNORMAL LOW (ref >59)

## 2023-03-28 LAB — LIPID PANEL
Chol/HDL Ratio: 3 {ratio} (ref 0.0–5.0)
Cholesterol, Total: 96 mg/dL — ABNORMAL LOW (ref 100–199)
HDL: 32 mg/dL — ABNORMAL LOW (ref >39)
LDL Chol Calc (NIH): 47 mg/dL (ref 0–99)
Triglycerides: 83 mg/dL (ref 0–149)
VLDL Cholesterol Cal: 17 mg/dL (ref 5–40)

## 2023-03-28 LAB — AMYLASE: Amylase: 110 U/L (ref 31–110)

## 2023-03-28 LAB — LIPASE: Lipase: 109 U/L — ABNORMAL HIGH (ref 13–78)

## 2023-04-04 ENCOUNTER — Encounter: Payer: Self-pay | Admitting: Nurse Practitioner

## 2023-04-04 DIAGNOSIS — Z794 Long term (current) use of insulin: Secondary | ICD-10-CM | POA: Insufficient documentation

## 2023-04-04 DIAGNOSIS — Z23 Encounter for immunization: Secondary | ICD-10-CM | POA: Insufficient documentation

## 2023-04-04 DIAGNOSIS — R109 Unspecified abdominal pain: Secondary | ICD-10-CM | POA: Insufficient documentation

## 2023-04-04 NOTE — Assessment & Plan Note (Signed)
hgbA1c was slightly increased at last visit. Continue current medications.

## 2023-04-04 NOTE — Assessment & Plan Note (Signed)
He is doing well on his CPAP.  Has good quality of life while using.

## 2023-04-04 NOTE — Assessment & Plan Note (Signed)
Influenza vaccine administered Encouraged to take Tylenol as needed for fever or muscle aches.

## 2023-04-04 NOTE — Assessment & Plan Note (Signed)
Will check amylase and lipase. Tenderness to right upper abdomen

## 2023-04-04 NOTE — Assessment & Plan Note (Signed)
Blood pressure is well controlled. Continue current medications. 

## 2023-04-04 NOTE — Assessment & Plan Note (Signed)
Covid 19 vaccine given in office observed for 15 minutes without any adverse reaction  

## 2023-04-04 NOTE — Assessment & Plan Note (Signed)
Continue statin. 

## 2023-04-04 NOTE — Assessment & Plan Note (Signed)
Cholesterol levels have stable.  Continue statin

## 2023-04-10 ENCOUNTER — Encounter: Payer: Self-pay | Admitting: Nurse Practitioner

## 2023-04-12 DIAGNOSIS — H04123 Dry eye syndrome of bilateral lacrimal glands: Secondary | ICD-10-CM | POA: Diagnosis not present

## 2023-04-12 DIAGNOSIS — H353132 Nonexudative age-related macular degeneration, bilateral, intermediate dry stage: Secondary | ICD-10-CM | POA: Diagnosis not present

## 2023-04-12 DIAGNOSIS — E103593 Type 1 diabetes mellitus with proliferative diabetic retinopathy without macular edema, bilateral: Secondary | ICD-10-CM | POA: Diagnosis not present

## 2023-04-12 DIAGNOSIS — H401132 Primary open-angle glaucoma, bilateral, moderate stage: Secondary | ICD-10-CM | POA: Diagnosis not present

## 2023-04-12 LAB — HM DIABETES EYE EXAM

## 2023-04-13 ENCOUNTER — Ambulatory Visit (INDEPENDENT_AMBULATORY_CARE_PROVIDER_SITE_OTHER): Payer: Medicare Other | Admitting: Podiatry

## 2023-04-13 ENCOUNTER — Encounter: Payer: Self-pay | Admitting: Podiatry

## 2023-04-13 ENCOUNTER — Encounter: Payer: Self-pay | Admitting: Nurse Practitioner

## 2023-04-13 DIAGNOSIS — E1151 Type 2 diabetes mellitus with diabetic peripheral angiopathy without gangrene: Secondary | ICD-10-CM

## 2023-04-13 DIAGNOSIS — M869 Osteomyelitis, unspecified: Secondary | ICD-10-CM

## 2023-04-13 DIAGNOSIS — Z89422 Acquired absence of other left toe(s): Secondary | ICD-10-CM

## 2023-04-13 NOTE — Progress Notes (Signed)
Subjective:  Patient ID: Micheal Walls, male    DOB: Apr 27, 1936,  MRN: [id number]  Chief Complaint  Patient presents with   Routine Post Op    PATIENT STATES THAT HIS FOOT IS DOING GOOD AND HE IS READY FOR A SHOE NOW , PATIENT STATES THAT HE HAS NOT BEEN IN ANY PAIN     DOS: 02/07/2023 Procedure: Amputation of left third foot and fifth toe  87 y.o. male returns for post-op check.  Now 2 month post op.  He states is doing okay.  No acute complaints.  He has been using an Ace wrap dressing on the left foot denies any drainage.  Review of Systems: Negative except as noted in the HPI. Denies N/V/F/Ch.   Objective:  There were no vitals filed for this visit. There is no height or weight on file to calculate BMI. Constitutional Well developed. Well nourished.  Vascular Foot warm and well perfused. Capillary refill normal to all digits.  Calf is soft and supple, no posterior calf or knee pain, negative Homans' sign  Neurologic Normal speech. Oriented to person, place, and time. Epicritic sensation to light touch grossly present bilaterally.  Dermatologic Amp site well healed , no residual infection or dehiscence noted  Orthopedic: No tenderness to palpation noted about the surgical site.   Multiple view plain film radiographs: Deferred Assessment:   1. Status post amputation of lesser toe of left foot (HCC)   2. 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 Type II diabetes mellitus with peripheral circulatory disorder [facility])      Plan:  Patient was evaluated and treated and all questions answered.  S/p foot surgery left, amputation of remaining lesser digits on the left foot -Progressing as expected post-operatively.   -amputation site appears to be fully healed at this time -XR: Deferred at this visit -WB Status: Weightbearing as tolerated in regular shoe gear will proceed with diabetic shoes and liners with toe filler -Sutures: Previously  removed -Medications: None -Ace wrap as needed -We will proceed with diabetic shoes and liners given amputation of toes on left foot order entered in epic   No follow-ups on file.

## 2023-04-20 ENCOUNTER — Ambulatory Visit
Admission: RE | Admit: 2023-04-20 | Discharge: 2023-04-20 | Disposition: A | Discharge status: Home / Self Care | Payer: Medicare Other | Source: Ambulatory Visit | Attending: Nurse Practitioner | Admitting: Nurse Practitioner

## 2023-04-20 DIAGNOSIS — R109 Unspecified abdominal pain: Secondary | ICD-10-CM | POA: Diagnosis not present

## 2023-04-20 DIAGNOSIS — K5730 Diverticulosis of large intestine without perforation or abscess without bleeding: Secondary | ICD-10-CM | POA: Diagnosis not present

## 2023-04-20 DIAGNOSIS — I700 Atherosclerosis of aorta: Secondary | ICD-10-CM | POA: Diagnosis not present

## 2023-05-01 ENCOUNTER — Other Ambulatory Visit: Payer: Self-pay

## 2023-05-01 ENCOUNTER — Inpatient Hospital Stay: Payer: Medicare Other | Attending: Hematology and Oncology

## 2023-05-01 ENCOUNTER — Other Ambulatory Visit: Payer: Self-pay | Admitting: Hematology and Oncology

## 2023-05-01 DIAGNOSIS — D472 Monoclonal gammopathy: Secondary | ICD-10-CM | POA: Insufficient documentation

## 2023-05-01 DIAGNOSIS — N189 Chronic kidney disease, unspecified: Secondary | ICD-10-CM | POA: Diagnosis not present

## 2023-05-01 DIAGNOSIS — D631 Anemia in chronic kidney disease: Secondary | ICD-10-CM | POA: Diagnosis not present

## 2023-05-01 LAB — CBC WITH DIFFERENTIAL (CANCER CENTER ONLY)
Abs Immature Granulocytes: 0.02 10*3/uL (ref 0.00–0.07)
Basophils Absolute: 0 10*3/uL (ref 0.0–0.1)
Basophils Relative: 0 %
Eosinophils Absolute: 0.3 10*3/uL (ref 0.0–0.5)
Eosinophils Relative: 5 %
HCT: 35.2 % — ABNORMAL LOW (ref 39.0–52.0)
Hemoglobin: 11.4 g/dL — ABNORMAL LOW (ref 13.0–17.0)
Immature Granulocytes: 0 %
Lymphocytes Relative: 26 %
Lymphs Abs: 1.6 10*3/uL (ref 0.7–4.0)
MCH: 28.6 pg (ref 26.0–34.0)
MCHC: 32.4 g/dL (ref 30.0–36.0)
MCV: 88.4 fL (ref 80.0–100.0)
Monocytes Absolute: 0.6 10*3/uL (ref 0.1–1.0)
Monocytes Relative: 9 %
Neutro Abs: 3.7 10*3/uL (ref 1.7–7.7)
Neutrophils Relative %: 60 %
Platelet Count: 184 10*3/uL (ref 150–400)
RBC: 3.98 MIL/uL — ABNORMAL LOW (ref 4.22–5.81)
RDW: 16.3 % — ABNORMAL HIGH (ref 11.5–15.5)
WBC Count: 6.2 10*3/uL (ref 4.0–10.5)
nRBC: 0 % (ref 0.0–0.2)

## 2023-05-01 LAB — CMP (CANCER CENTER ONLY)
ALT: 30 U/L (ref 0–44)
AST: 29 U/L (ref 15–41)
Albumin: 3.7 g/dL (ref 3.5–5.0)
Alkaline Phosphatase: 74 U/L (ref 38–126)
Anion gap: 6 (ref 5–15)
BUN: 60 mg/dL — ABNORMAL HIGH (ref 8–23)
CO2: 28 mmol/L (ref 22–32)
Calcium: 10.2 mg/dL (ref 8.9–10.3)
Chloride: 105 mmol/L (ref 98–111)
Creatinine: 2.17 mg/dL — ABNORMAL HIGH (ref 0.61–1.24)
GFR, Estimated: 29 mL/min — ABNORMAL LOW (ref >60)
Glucose, Bld: 108 mg/dL — ABNORMAL HIGH (ref 70–99)
Potassium: 3.9 mmol/L (ref 3.5–5.1)
Sodium: 139 mmol/L (ref 135–145)
Total Bilirubin: 0.4 mg/dL (ref <1.2)
Total Protein: 7.5 g/dL (ref 6.5–8.1)

## 2023-05-01 LAB — LACTATE DEHYDROGENASE: LDH: 129 U/L (ref 98–192)

## 2023-05-02 LAB — KAPPA/LAMBDA LIGHT CHAINS
Kappa free light chain: 138 mg/L — ABNORMAL HIGH (ref 3.3–19.4)
Kappa, lambda light chain ratio: 1.52 (ref 0.26–1.65)
Lambda free light chains: 90.7 mg/L — ABNORMAL HIGH (ref 5.7–26.3)

## 2023-05-03 ENCOUNTER — Other Ambulatory Visit: Payer: Self-pay | Admitting: Cardiovascular Disease

## 2023-05-07 LAB — MULTIPLE MYELOMA PANEL, SERUM
Albumin SerPl Elph-Mcnc: 3.5 g/dL (ref 2.9–4.4)
Albumin/Glob SerPl: 1.1 (ref 0.7–1.7)
Alpha 1: 0.2 g/dL (ref 0.0–0.4)
Alpha2 Glob SerPl Elph-Mcnc: 0.6 g/dL (ref 0.4–1.0)
B-Globulin SerPl Elph-Mcnc: 1.2 g/dL (ref 0.7–1.3)
Gamma Glob SerPl Elph-Mcnc: 1.5 g/dL (ref 0.4–1.8)
Globulin, Total: 3.5 g/dL (ref 2.2–3.9)
IgA: 646 mg/dL — ABNORMAL HIGH (ref 61–437)
IgG (Immunoglobin G), Serum: 1444 mg/dL (ref 603–1613)
IgM (Immunoglobulin M), Srm: 58 mg/dL (ref 15–143)
M Protein SerPl Elph-Mcnc: 0.5 g/dL — ABNORMAL HIGH
Total Protein ELP: 7 g/dL (ref 6.0–8.5)

## 2023-05-07 NOTE — Progress Notes (Unsigned)
Patient Care Team: [name], [name] as PCP - General (General Practice) [name], Rachelle Hora, MD as PCP - Cardiology (Cardiology) Harlan Stains, [facility] (Inactive) (Pharmacist)   CHIEF COMPLAINT: Follow up IgG lambda MGUS  CURRENT THERAPY: Observation  INTERVAL HISTORY [name] returns for follow up as scheduled. Last seen by Dr. Leonides Schanz 6 months ago. Labs 05/01/23 ***  ROS   Past Medical History:  Diagnosis Date   Acute metabolic encephalopathy 01/16/2020   Amputated below knee [facility])    right   Anemia    Aspiration pneumonia (HCC) 11/02/2019   Diastolic heart failure (HCC)    Diverticulosis    DM (diabetes mellitus) (HCC)    Gout    Hyperlipemia    OSA on CPAP    RBBB    Sepsis (HCC) 11/02/2019   Systemic hypertension      Past Surgical History:  Procedure Laterality Date   ABDOMINAL AORTOGRAM W/LOWER EXTREMITY Left 08/11/2020   Procedure: ABDOMINAL AORTOGRAM W/LOWER EXTREMITY;  Surgeon: Nada Libman, MD;  [facility];  Service: Cardiovascular;  Laterality: Left;   ABDOMINAL AORTOGRAM W/LOWER EXTREMITY N/A 02/26/2021   Procedure: ABDOMINAL AORTOGRAM W/LOWER EXTREMITY;  Surgeon: Leonie Douglas, MD;  [facility];  Service: Cardiovascular;  Laterality: N/A;   AMPUTATION Left 09/14/2020   Procedure: AMPUTATION LEFT GREAT TOE;  Surgeon: Edwin Cap, DPM;  Location: [facility] OR;  Service: Podiatry;  Laterality: Left;   AMPUTATION Left 02/07/2023   Procedure: AMPUTATION TOE 3RD,4TH, and 5TH;  Surgeon: [name], DPM;  Location: [facility] OR;  Service: Orthopedics/Podiatry;  Laterality: Left;   AMPUTATION TOE Left 12/03/2021   Procedure: AMPUTATION TOE, LEFT second;  Surgeon: Louann Sjogren, DPM;  Location: [facility];  Service: Podiatry;  Laterality: Left;   LEG AMPUTATION BELOW KNEE     right   NM MYOCAR PERF WALL MOTION  09/18/2009   no ischemia   PERIPHERAL VASCULAR BALLOON ANGIOPLASTY Left 08/11/2020   Procedure: PERIPHERAL VASCULAR BALLOON  ANGIOPLASTY;  Surgeon: Nada Libman, MD;  [facility];  Service: Cardiovascular;  Laterality: Left;  AT   PERIPHERAL VASCULAR INTERVENTION Left 02/26/2021   Procedure: PERIPHERAL VASCULAR INTERVENTION;  Surgeon: Leonie Douglas, MD;  [facility];  Service: Cardiovascular;  Laterality: Left;     Outpatient Encounter Medications as of 05/08/2023  Medication Sig   allopurinol (ZYLOPRIM) 100 MG tablet Take 100 mg by mouth 2 (two) times daily.   aspirin EC 81 MG tablet Take 81 mg by mouth daily. Swallow whole.   Castellani Paint 1.5 % LIQD Apply between toes daily   colchicine 0.6 MG tablet Take 0.6 mg by mouth once a week.   Continuous Glucose Sensor (FREESTYLE LIBRE 14 DAY SENSOR) MISC Use as directed to check blood sugars   diltiazem (CARDIZEM CD) 240 MG 24 hr capsule TAKE ONE CAPSULE BY MOUTH ONCE DAILY   dorzolamide (TRUSOPT) 2 % ophthalmic solution Place 1 drop into both eyes 2 (two) times daily.   ferrous sulfate 325 (65 FE) MG tablet Take 1 tablet (325 mg total) by mouth 2 (two) times daily with a meal.   furosemide (LASIX) 40 MG tablet TAKE ONE (1) TABLET BY MOUTH TWICE DAILY *REFILL REQUEST*   gabapentin (NEURONTIN) 100 MG capsule TAKE TWO (2) CAPSULES BY MOUTH IN THE MORNING, TAKE 1 CAPSULE AT NOON AND 1 CAPSULE EVERY EVENING *REFILL REQUEST*   gentamicin ointment (GARAMYCIN) 0.1 % APPLY TOPICALLY THREE TIMES  A DAY   glucose blood (FREESTYLE PRECISION NEO TEST) test strip Use as instructed   latanoprost (XALATAN) 0.005 % ophthalmic solution Place 1 drop into both eyes at bedtime.    Multiple Vitamins-Minerals (MULTIVITAMIN WITH MINERALS) tablet Take 1 tablet by mouth daily.   Multiple Vitamins-Minerals (PRESERVISION AREDS) CAPS Take 2 capsules by mouth daily.   pantoprazole (PROTONIX) 40 MG tablet TAKE 1 TABLET BY MOUTH EVERY DAY AT BEDTIME *REFILL REQUEST*   rosuvastatin (CRESTOR) 20 MG tablet TAKE ONE TABLET BY MOUTH EVERYDAY AT BEDTIME (Patient  taking differently: Take 20 mg by mouth daily.)   Semaglutide, 2 MG/DOSE, (OZEMPIC, 2 MG/DOSE,) 8 MG/3ML SOPN Inject 2 mg into the skin once a week.   senna-docusate (SENOKOT-S) 8.6-50 MG tablet Take 1 tablet by mouth 2 (two) times daily between meals as needed for mild constipation or moderate constipation. (Patient taking differently: Take 1 tablet by mouth 2 (two) times daily as needed (constipation).)   SYNTHROID 25 MCG tablet Take 1 tablet by mouth Monday - Friday   tamsulosin (FLOMAX) 0.4 MG CAPS capsule Take 0.4 mg by mouth daily.   No facility-administered encounter medications on file as of 05/08/2023.     There were no vitals filed for this visit. There is no height or weight on file to calculate BMI.   PHYSICAL EXAM GENERAL:alert, no distress and comfortable SKIN: no rash  EYES: sclera clear NECK: without mass LYMPH:  no palpable cervical or supraclavicular lymphadenopathy  LUNGS: clear with normal breathing effort HEART: regular rate & rhythm, no lower extremity edema ABDOMEN: abdomen soft, non-tender and normal bowel sounds NEURO: alert & oriented x 3 with fluent speech, no focal motor/sensory deficits Breast exam:  PAC without erythema    CBC    Component Value Date/Time   WBC 6.2 05/01/2023 1003   WBC 5.8 02/07/2023 0436   RBC 3.98 (L) 05/01/2023 1003   HGB 11.4 (L) 05/01/2023 1003   HGB 10.2 (L) 11/17/2021 1040   HCT 35.2 (L) 05/01/2023 1003   HCT 30.9 (L) 11/17/2021 1040   PLT 184 05/01/2023 1003   PLT 194 11/17/2021 1040   MCV 88.4 05/01/2023 1003   MCV 88 11/17/2021 1040   MCH 28.6 05/01/2023 1003   MCHC 32.4 05/01/2023 1003   RDW 16.3 (H) 05/01/2023 1003   RDW 14.8 11/17/2021 1040   LYMPHSABS 1.6 05/01/2023 1003   LYMPHSABS 1.4 11/17/2021 1040   MONOABS 0.6 05/01/2023 1003   EOSABS 0.3 05/01/2023 1003   EOSABS 0.2 11/17/2021 1040   BASOSABS 0.0 05/01/2023 1003   BASOSABS 0.0 11/17/2021 1040     CMP     Component Value Date/Time   NA 139  05/01/2023 1003   NA 140 03/27/2023 1114   K 3.9 05/01/2023 1003   CL 105 05/01/2023 1003   CO2 28 05/01/2023 1003   GLUCOSE 108 (H) 05/01/2023 1003   BUN 60 (H) 05/01/2023 1003   BUN 70 (H) 03/27/2023 1114   CREATININE 2.17 (H) 05/01/2023 1003   CREATININE 1.85 (H) 08/27/2020 1525   CALCIUM 10.2 05/01/2023 1003   PROT 7.5 05/01/2023 1003   PROT 7.0 03/27/2023 1114   ALBUMIN 3.7 05/01/2023 1003   ALBUMIN 3.7 03/27/2023 1114   AST 29 05/01/2023 1003   ALT 30 05/01/2023 1003   ALKPHOS 74 05/01/2023 1003   BILITOT 0.4 05/01/2023 1003   GFRNONAA 29 (L) 05/01/2023 1003   GFRAA 42 (L) 05/20/2020 0951     ASSESSMENT & PLAN:  MGUS: -SPEP from  04/26/2022 detected M protein measuring 0.3 g/dL. Immunofixation revealed monoclonal free lambda light chain. Kappa free light chains 123.1 mg/L, -Lambda free light chains 81.9 mg/L and Ratio 1.50.  -Bone survey from 12/30/2021 showed no lytic or destructive lesions.  -Bone marrow biopsy from 01/17/2022 showed 9% plasma cells.  -UPEP from 01/17/2022 showed no evidence of M protein.  -Work up c/w MGUS   PLAN:  No orders of the defined types were placed in this encounter.     All questions were answered. The patient knows to call the clinic with any problems, questions or concerns. No barriers to learning were detected. I spent *** counseling the patient face to face. The total time spent in the appointment was *** and more than 50% was on counseling, review of test results, and coordination of care.   Santiago Glad, NP-C @DATE @

## 2023-05-08 ENCOUNTER — Encounter: Payer: Self-pay | Admitting: Nurse Practitioner

## 2023-05-08 ENCOUNTER — Inpatient Hospital Stay (HOSPITAL_BASED_OUTPATIENT_CLINIC_OR_DEPARTMENT_OTHER): Payer: Medicare Other | Admitting: Nurse Practitioner

## 2023-05-08 VITALS — BP 125/73 | HR 76 | Temp 97.8°F | Resp 18

## 2023-05-08 DIAGNOSIS — D472 Monoclonal gammopathy: Secondary | ICD-10-CM

## 2023-05-08 DIAGNOSIS — N189 Chronic kidney disease, unspecified: Secondary | ICD-10-CM | POA: Diagnosis not present

## 2023-05-08 DIAGNOSIS — D631 Anemia in chronic kidney disease: Secondary | ICD-10-CM | POA: Diagnosis not present

## 2023-05-23 DIAGNOSIS — H35372 Puckering of macula, left eye: Secondary | ICD-10-CM | POA: Diagnosis not present

## 2023-05-23 DIAGNOSIS — E113592 Type 2 diabetes mellitus with proliferative diabetic retinopathy without macular edema, left eye: Secondary | ICD-10-CM | POA: Diagnosis not present

## 2023-05-23 DIAGNOSIS — H353122 Nonexudative age-related macular degeneration, left eye, intermediate dry stage: Secondary | ICD-10-CM | POA: Diagnosis not present

## 2023-05-23 DIAGNOSIS — E113591 Type 2 diabetes mellitus with proliferative diabetic retinopathy without macular edema, right eye: Secondary | ICD-10-CM | POA: Diagnosis not present

## 2023-05-23 LAB — HM DIABETES EYE EXAM

## 2023-06-02 ENCOUNTER — Ambulatory Visit: Payer: Medicare Other

## 2023-06-02 VITALS — BP 120/60 | HR 75 | Temp 98.5°F | Ht 73.0 in | Wt 250.0 lb

## 2023-06-02 DIAGNOSIS — Z2821 Immunization not carried out because of patient refusal: Secondary | ICD-10-CM

## 2023-06-02 DIAGNOSIS — Z0000 Encounter for general adult medical examination without abnormal findings: Secondary | ICD-10-CM

## 2023-06-02 NOTE — Progress Notes (Signed)
Subjective:   Micheal Walls is a 87 y.o. male who presents for Medicare Annual/Subsequent preventive examination.  Visit Complete: In person  Patient Medicare AWV questionnaire was completed by the patient on 06/02/2023; I have confirmed that all information answered by patient is correct and no changes since this date.        Objective:    There were no vitals filed for this visit. There is no height or weight on file to calculate BMI.     02/05/2023   12:30 PM 05/26/2022   12:04 PM 01/17/2022    7:56 AM 01/11/2022    1:36 PM 05/20/2021    9:16 AM 02/26/2021    6:29 AM 10/21/2020    6:43 PM  Advanced Directives  Does Patient Have a Medical Advance Directive? No Yes No No No Yes No  Type of Advance Directive  Healthcare Power of [place] Power of Attorney   Does patient want to make changes to medical advance directive?      No - Patient declined   Copy of Healthcare Power of Attorney in Chart?  No - copy requested       Would patient like information on creating a medical advance directive? No - Patient declined  No - Patient declined No - Patient declined No - Patient declined  No - Patient declined    Current Medications (verified) Outpatient Encounter Medications as of 06/02/2023  Medication Sig   allopurinol (ZYLOPRIM) 100 MG tablet Take 100 mg by mouth 2 (two) times daily.   aspirin EC 81 MG tablet Take 81 mg by mouth daily. Swallow whole.   Castellani Paint 1.5 % LIQD Apply between toes daily   colchicine 0.6 MG tablet Take 0.6 mg by mouth once a week.   Continuous Glucose Sensor (FREESTYLE LIBRE 14 DAY SENSOR) MISC Use as directed to check blood sugars   diltiazem (CARDIZEM CD) 240 MG 24 hr capsule TAKE ONE CAPSULE BY MOUTH ONCE DAILY   dorzolamide (TRUSOPT) 2 % ophthalmic solution Place 1 drop into both eyes 2 (two) times daily.   ferrous sulfate 325 (65 FE) MG tablet Take 1 tablet (325 mg total) by mouth 2 (two) times daily with a meal.   furosemide  (LASIX) 40 MG tablet TAKE ONE (1) TABLET BY MOUTH TWICE DAILY *REFILL REQUEST*   gabapentin (NEURONTIN) 100 MG capsule TAKE TWO (2) CAPSULES BY MOUTH IN THE MORNING, TAKE 1 CAPSULE AT NOON AND 1 CAPSULE EVERY EVENING *REFILL REQUEST*   gentamicin ointment (GARAMYCIN) 0.1 % APPLY TOPICALLY THREE TIMES A DAY   glucose blood (FREESTYLE PRECISION NEO TEST) test strip Use as instructed   latanoprost (XALATAN) 0.005 % ophthalmic solution Place 1 drop into both eyes at bedtime.    Multiple Vitamins-Minerals (MULTIVITAMIN WITH MINERALS) tablet Take 1 tablet by mouth daily.   Multiple Vitamins-Minerals (PRESERVISION AREDS) CAPS Take 2 capsules by mouth daily.   pantoprazole (PROTONIX) 40 MG tablet TAKE 1 TABLET BY MOUTH EVERY DAY AT BEDTIME *REFILL REQUEST*   rosuvastatin (CRESTOR) 20 MG tablet TAKE ONE TABLET BY MOUTH EVERYDAY AT BEDTIME (Patient taking differently: Take 20 mg by mouth daily.)   Semaglutide, 2 MG/DOSE, (OZEMPIC, 2 MG/DOSE,) 8 MG/3ML SOPN Inject 2 mg into the skin once a week.   senna-docusate (SENOKOT-S) 8.6-50 MG tablet Take 1 tablet by mouth 2 (two) times daily between meals as needed for mild constipation or moderate constipation. (Patient taking differently: Take 1 tablet by mouth 2 (two) times daily  as needed (constipation).)   SYNTHROID 25 MCG tablet Take 1 tablet by mouth Monday - Friday   tamsulosin (FLOMAX) 0.4 MG CAPS capsule Take 0.4 mg by mouth daily.   No facility-administered encounter medications on file as of 06/02/2023.    Allergies (verified) Vancomycin   History: Past Medical History:  Diagnosis Date   Acute metabolic encephalopathy 01/16/2020   Amputated below knee [facility])    right   Anemia    Aspiration pneumonia (HCC) 11/02/2019   Diastolic heart failure (HCC)    Diverticulosis    DM (diabetes mellitus) (HCC)    Gout    Hyperlipemia    OSA on CPAP    RBBB    Sepsis (HCC) 11/02/2019   Systemic hypertension    Past Surgical History:  Procedure Laterality  Date   ABDOMINAL AORTOGRAM W/LOWER EXTREMITY Left 08/11/2020   Procedure: ABDOMINAL AORTOGRAM W/LOWER EXTREMITY;  Surgeon: Nada Libman, MD;  [facility];  Service: Cardiovascular;  Laterality: Left;   ABDOMINAL AORTOGRAM W/LOWER EXTREMITY N/A 02/26/2021   Procedure: ABDOMINAL AORTOGRAM W/LOWER EXTREMITY;  Surgeon: Leonie Douglas, MD;  [facility];  Service: Cardiovascular;  Laterality: N/A;   AMPUTATION Left 09/14/2020   Procedure: AMPUTATION LEFT GREAT TOE;  Surgeon: Edwin Cap, DPM;  [facility]: [facility] OR;  Service: Podiatry;  Laterality: Left;   AMPUTATION Left 02/07/2023   Procedure: AMPUTATION TOE 3RD,4TH, and 5TH;  Surgeon: [name], DPM;  [facility]: [facility];  Service: Orthopedics/Podiatry;  Laterality: Left;   AMPUTATION TOE Left 12/03/2021   Procedure: AMPUTATION TOE, LEFT second;  Surgeon: Louann Sjogren, DPM;  Location: [facility] OR;  Service: Podiatry;  Laterality: Left;   LEG AMPUTATION BELOW KNEE     right   NM MYOCAR PERF WALL MOTION  09/18/2009   no ischemia   PERIPHERAL VASCULAR BALLOON ANGIOPLASTY Left 08/11/2020   Procedure: PERIPHERAL VASCULAR BALLOON ANGIOPLASTY;  Surgeon: Nada Libman, MD;  [facility];  Service: Cardiovascular;  Laterality: Left;  AT   PERIPHERAL VASCULAR INTERVENTION Left 02/26/2021   Procedure: PERIPHERAL VASCULAR INTERVENTION;  Surgeon: Leonie Douglas, MD;  [facility];  Service: Cardiovascular;  Laterality: Left;   Family History  Problem Relation Age of Onset   Diabetes Mother    Heart attack Father    Cancer Sister    Social History   Socioeconomic History   Marital status: Married    Spouse name: Not on file   Number of children: Not on file   Years of education: Not on file   Highest education level: 12th grade  Occupational History   Occupation: retired  Tobacco Use   Smoking status: Former    Types: Cigars    Quit date: 1997    Years since quitting: 27.9    Smokeless tobacco: Never  Vaping Use   Vaping status: Former  Substance and Sexual Activity   Alcohol use: No    Alcohol/week: 0.0 standard drinks of alcohol   Drug use: No   Sexual activity: Not Currently  Other Topics Concern   Not on file  Social History Narrative   Not on file   Social Drivers of Health   Financial Resource Strain: Low Risk  (03/26/2023)   Overall Financial Resource Strain (CARDIA)    Difficulty of Paying Living Expenses: Not hard at all  Food Insecurity: No Food Insecurity (03/26/2023)   Hunger Vital Sign    Worried About Running Out of Food  in the Last Year: Never true    Ran Out of Food in the Last Year: Never true  Transportation Needs: No Transportation Needs (03/26/2023)   PRAPARE - Administrator, Civil Service (Medical): No    Lack of Transportation (Non-Medical): No  Physical Activity: Unknown (03/26/2023)   Exercise Vital Sign    Days of Exercise per Week: 0 days    Minutes of Exercise per Session: Not on file  Recent Concern: Physical Activity - Inactive (03/26/2023)   Exercise Vital Sign    Days of Exercise per Week: 0 days    Minutes of Exercise per Session: 0 min  Stress: No Stress Concern Present (03/26/2023)   [employer] of Occupational Health - Occupational Stress Questionnaire    Feeling of Stress : Not at all  Social Connections: Moderately Integrated (03/26/2023)   Social Connection and Isolation Panel [NHANES]    Frequency of Communication with Friends and Family: More than three times a week    Frequency of Social Gatherings with Friends and Family: Three times a week    Attends Religious Services: Never    Active Member of Clubs or Organizations: Yes    Attends Banker Meetings: Never    Marital Status: Married    Tobacco Counseling Counseling given: Not Answered   Clinical Intake:              How often do you need to have someone help you when you read instructions, pamphlets, or  other written materials from your doctor or pharmacy?: (Patient-Rptd) (P) 3 - Sometimes         Activities of Daily Living    05/30/2023    7:38 PM 02/05/2023    7:25 PM  In your present state of health, do you have any difficulty performing the following activities:  Hearing? 0 1  Vision? 0 1  Difficulty concentrating or making decisions? 0 0  Walking or climbing stairs? 1 1  Dressing or bathing? 0 0  Doing errands, shopping? 0 0  Preparing Food and eating ? N   Using the Toilet? N   In the past six months, have you accidently leaked urine? N   Do you have problems with loss of bowel control? Y   Managing your Finances? N   Housekeeping or managing your Housekeeping? N     Patient Care Team: [name], [name] as PCP - General (General Practice) [name], Rachelle Hora, MD as PCP - Cardiology (Cardiology) Harlan Stains, [facility] (Inactive) (Pharmacist)  Indicate any recent Medical Services you may have received from other than [facility] providers in the past year (date may be approximate).     Assessment:   This is a routine wellness examination for [place].  Hearing/Vision screen No results found.   Goals Addressed   None    Depression Screen    07/25/2022   10:42 AM 05/26/2022   12:05 PM 05/20/2021    9:19 AM 05/20/2020    9:18 AM 09/24/2019    8:45 AM 06/25/2019    9:14 AM 05/15/2019    9:42 AM  PHQ 2/9 Scores  PHQ - 2 Score 0 0 0 0 0 0 0  PHQ- 9 Score       3    Fall Risk    05/30/2023    7:38 PM 11/24/2022   11:35 AM 07/25/2022   10:42 AM 05/26/2022   12:05 PM 05/25/2022    2:25 PM  Fall Risk   Falls in  the past year? 0 0 0 0 0  Number falls in past yr: 0 0 0 0 0  Injury with Fall? 0 0 0 0 0  Risk for fall due to :  No Fall Risks No Fall Risks Medication side effect;Impaired mobility;Impaired balance/gait   Follow up  Falls evaluation completed Falls evaluation completed Falls prevention discussed;Education provided;Falls evaluation completed     MEDICARE  RISK AT HOME: Medicare Risk at Home Any stairs in or around the home?: (Patient-Rptd) (P) No If so, are there any without handrails?: (Patient-Rptd) (P) No Home free of loose throw rugs in walkways, pet beds, electrical cords, etc?: (Patient-Rptd) (P) Yes Adequate lighting in your home to reduce risk of falls?: (Patient-Rptd) (P) Yes Life alert?: (Patient-Rptd) (P) No Use of a cane, walker or w/c?: (Patient-Rptd) (P) Yes Grab bars in the bathroom?: (Patient-Rptd) (P) Yes Shower chair or bench in shower?: (Patient-Rptd) (P) Yes Elevated toilet seat or a handicapped toilet?: (Patient-Rptd) (P) Yes  TIMED UP AND GO:  Was the test performed?  No    Cognitive Function:        05/26/2022   12:06 PM 05/20/2021    9:20 AM 05/20/2020    9:20 AM 05/15/2019    9:46 AM 03/15/2018    9:22 AM  6CIT Screen  What Year? 0 points 0 points 0 points 0 points 0 points  What month? 0 points 0 points 0 points 0 points 0 points  What time? 0 points 0 points 0 points 0 points 0 points  Count back from 20 0 points 0 points 2 points 0 points 0 points  Months in reverse 0 points 0 points 0 points 0 points 0 points  Repeat phrase 2 points 6 points 0 points 2 points 2 points  Total Score 2 points 6 points 2 points 2 points 2 points    Immunizations Immunization History  Administered Date(s) Administered   Fluad Quad(high Dose 65+) 05/20/2020, 04/09/2021, 03/21/2022   Fluad Trivalent(High Dose 65+) 03/27/2023   Influenza, High Dose Seasonal PF 03/21/2019   PFIZER(Purple Top)SARS-COV-2 Vaccination 08/31/2019, 09/25/2019, 04/03/2020, 06/22/2021   Pfizer Covid-19 Vaccine Bivalent Booster 80yrs & up 06/22/2021, 07/07/2022   Pfizer(Comirnaty)Fall Seasonal Vaccine 12 years and older 03/27/2023   Pneumococcal Conjugate-13 02/23/2016   Pneumococcal Polysaccharide-23 05/15/2019   Tdap 02/04/2013   Zoster Recombinant(Shingrix) 06/22/2021, 09/28/2021    TDAP status: Due, Education has been provided regarding  the importance of this vaccine. Advised may receive this vaccine at local pharmacy or Health Dept. Aware to provide a copy of the vaccination record if obtained from local pharmacy or Health Dept. Verbalized acceptance and understanding.  Flu Vaccine status: Up to date  Pneumococcal vaccine status: Up to date  Covid-19 vaccine status: Declined, Education has been provided regarding the importance of this vaccine but patient still declined. Advised may receive this vaccine at local pharmacy or Health Dept.or vaccine clinic. Aware to provide a copy of the vaccination record if obtained from local pharmacy or Health Dept. Verbalized acceptance and understanding.  Qualifies for Shingles Vaccine? Yes   Zostavax completed Yes   Shingrix Completed?: Yes  Screening Tests Health Maintenance  Topic Date Due   DTaP/Tdap/Td (2 - Td or Tdap) 02/05/2023   COVID-19 Vaccine (7 - 2024-25 season) 05/22/2023   Medicare Annual Wellness (AWV)  05/27/2023   HEMOGLOBIN A1C  08/08/2023   Diabetic kidney evaluation - Urine ACR  11/24/2023   FOOT EXAM  11/24/2023   OPHTHALMOLOGY EXAM  04/11/2024  Diabetic kidney evaluation - eGFR measurement  04/30/2024   Pneumonia Vaccine 3+ Years old  Completed   INFLUENZA VACCINE  Completed   Zoster Vaccines- Shingrix  Completed   HPV VACCINES  Aged Out    Health Maintenance  Health Maintenance Due  Topic Date Due   DTaP/Tdap/Td (2 - Td or Tdap) 02/05/2023   COVID-19 Vaccine (7 - 2024-25 season) 05/22/2023   Medicare Annual Wellness (AWV)  05/27/2023    Colorectal cancer screening: Type of screening: Colonoscopy. Completed 2017. Repeat every none years  Lung Cancer Screening: (Low Dose CT Chest recommended if Age 85-80 years, 20 pack-year currently smoking OR have quit w/in 15years.) does not qualify.   Lung Cancer Screening Referral: n/a  Additional Screening:  Hepatitis C Screening: does not qualify; Completed n/a  Vision Screening: Recommended annual  ophthalmology exams for early detection of glaucoma and other disorders of the eye. Is the patient up to date with their annual eye exam?  Yes  Who is the provider or what is the name of the office in which the patient attends annual eye exams? [facility] If pt is not established with a provider, would they like to be referred to a provider to establish care? Yes .   Dental Screening: Recommended annual dental exams for proper oral hygiene  Diabetic Foot Exam: Diabetic Foot Exam: Completed 10/24/2022  Community Resource Referral / Chronic Care Management: CRR required this visit?  No   CCM required this visit?  No     Plan:     I have personally reviewed and noted the following in the patient's chart:   Medical and social history Use of alcohol, tobacco or illicit drugs  Current medications and supplements including opioid prescriptions. Patient is not currently taking opioid prescriptions. Functional ability and status Nutritional status Physical activity Advanced directives List of other physicians Hospitalizations, surgeries, and ER visits in previous 12 months Vitals Screenings to include cognitive, depression, and falls Referrals and appointments  In addition, I have reviewed and discussed with patient certain preventive protocols, quality metrics, and best practice recommendations. A written personalized care plan for preventive services as well as general preventive health recommendations were provided to patient.     Marlyn Corporal, CMA   06/02/2023   After Visit Summary: (In Person-Declined) Patient declined AVS at this time.  Nurse Notes: patient was pleasant.

## 2023-06-12 ENCOUNTER — Encounter: Payer: Self-pay | Admitting: Nurse Practitioner

## 2023-06-26 ENCOUNTER — Encounter: Payer: Self-pay | Admitting: Nurse Practitioner

## 2023-06-27 ENCOUNTER — Other Ambulatory Visit: Payer: Self-pay

## 2023-06-27 DIAGNOSIS — E1122 Type 2 diabetes mellitus with diabetic chronic kidney disease: Secondary | ICD-10-CM

## 2023-06-27 MED ORDER — FREESTYLE LIBRE 14 DAY SENSOR MISC: 1 refills | Status: DC

## 2023-06-27 MED ORDER — OZEMPIC (2 MG/DOSE) 8 MG/3ML ~~LOC~~ SOPN
2.0000 mg | PEN_INJECTOR | SUBCUTANEOUS | 1 refills | Status: DC
Start: 2023-06-27 — End: 2023-07-03

## 2023-06-28 ENCOUNTER — Encounter: Payer: Self-pay | Admitting: Nurse Practitioner

## 2023-06-29 ENCOUNTER — Other Ambulatory Visit: Payer: Self-pay

## 2023-06-29 DIAGNOSIS — N184 Chronic kidney disease, stage 4 (severe): Secondary | ICD-10-CM

## 2023-07-03 ENCOUNTER — Other Ambulatory Visit: Payer: Self-pay

## 2023-07-03 DIAGNOSIS — E1122 Type 2 diabetes mellitus with diabetic chronic kidney disease: Secondary | ICD-10-CM

## 2023-07-03 MED ORDER — FREESTYLE LIBRE 14 DAY SENSOR MISC: 1 refills | Status: DC

## 2023-07-03 MED ORDER — OZEMPIC (2 MG/DOSE) 8 MG/3ML ~~LOC~~ SOPN: 2.0000 mg | PEN_INJECTOR | SUBCUTANEOUS | 1 refills | Status: DC

## 2023-07-04 ENCOUNTER — Telehealth: Payer: Self-pay

## 2023-07-04 NOTE — Telephone Encounter (Signed)
[place] Ozempic approved 06/30/2023-06/29/2024. Ozempic 2mg .

## 2023-07-14 ENCOUNTER — Ambulatory Visit: Payer: Medicare Other | Admitting: Podiatry

## 2023-07-14 ENCOUNTER — Telehealth: Payer: Self-pay | Admitting: Cardiovascular Disease

## 2023-07-14 ENCOUNTER — Ambulatory Visit (INDEPENDENT_AMBULATORY_CARE_PROVIDER_SITE_OTHER): Payer: Medicare Other | Admitting: Podiatry

## 2023-07-14 ENCOUNTER — Encounter: Payer: Self-pay | Admitting: Podiatry

## 2023-07-14 DIAGNOSIS — E1151 Type 2 diabetes mellitus with diabetic peripheral angiopathy without gangrene: Secondary | ICD-10-CM | POA: Diagnosis not present

## 2023-07-14 DIAGNOSIS — Z89422 Acquired absence of other left toe(s): Secondary | ICD-10-CM | POA: Diagnosis not present

## 2023-07-14 MED ORDER — ROSUVASTATIN CALCIUM 20 MG PO TABS: 20.0000 mg | ORAL_TABLET | Freq: Every day | ORAL | 0 refills | Status: DC

## 2023-07-14 NOTE — Progress Notes (Unsigned)
  Subjective:  Patient ID: Micheal Walls, male    DOB: [birth year],  MRN: [id number]  No chief complaint on file.   88 y.o. male presents for diabetic foot check. History confirmed with patient.  He has history of right sided below-knee amputation, left side has had amputation of all toes at the level of the metatarsophalangeal joints..  Patient does have a history of T2DM. Patient does not have callus present at this point.  History of PVD, CVA, CVA,  CHF, CKD  Objective:  Physical Exam: warm, good capillary refill History of amputation to all toes the left foot DP pulse palpable, PT pulse nonpalpable, protective sensation absent, and vibratory sensation diminished Left Foot: No signs of hyperkeratotic buildup to amputation site.  Well-healed surgical scar.  Left leg edematous. Right Foot: History of below-knee amputation  Assessment:   1. Status post amputation of lesser toe of left foot (HCC)   2. Type II diabetes mellitus with peripheral circulatory disorder [facility])      Plan:  Patient was evaluated and treated and all questions answered.  # Diabetes with peripheral circulatory disorder, history of amputation - Patient educated on diabetes. Discussed proper diabetic foot care and discussed risks and complications of disease. Educated patient in depth on reasons to return to the office immediately should he/she discover anything concerning or new on the feet. All questions answered. Discussed proper shoes as well.  -Patient states he does have toe filler and shoes, primarily uses a wheelchair -No callus at this point, we can see patient back in 3 months to check on his foot, at this point time if no complications or areas of concern for ulceration can consider pushing patient out to every 6 months for evaluation.  -Advised patient to speak with his cardiologist regarding the extent of left lower extremity edema.  Return in about 3 months (around 10/11/2023) for Diabetic Foot Care.          Bronwen Betters, DPM [facility] / [facility]

## 2023-07-14 NOTE — Telephone Encounter (Signed)
*  STAT* If patient is at the pharmacy, call can be transferred to refill team.   1. Which medications need to be refilled? (please list name of each medication and dose if known) rosuvastatin (CRESTOR) 20 MG tablet   2. Which pharmacy/location (including street and city if local pharmacy) is medication to be sent to? [facility] - [address] [place], [place] - [address] Phone: [phone]  Fax: [phone]     3. Do they need a 30 day or 90 day supply? 30 Pt  made an appt for 07/31/23

## 2023-07-14 NOTE — Telephone Encounter (Signed)
 Refills has been sent to the pharmacy.

## 2023-07-25 ENCOUNTER — Encounter: Payer: Self-pay | Admitting: Nurse Practitioner

## 2023-07-27 DIAGNOSIS — M109 Gout, unspecified: Secondary | ICD-10-CM | POA: Diagnosis not present

## 2023-07-27 DIAGNOSIS — Z6841 Body Mass Index (BMI) 40.0 and over, adult: Secondary | ICD-10-CM | POA: Diagnosis not present

## 2023-07-27 DIAGNOSIS — E118 Type 2 diabetes mellitus with unspecified complications: Secondary | ICD-10-CM | POA: Diagnosis not present

## 2023-07-27 DIAGNOSIS — N3289 Other specified disorders of bladder: Secondary | ICD-10-CM | POA: Diagnosis not present

## 2023-07-27 DIAGNOSIS — E785 Hyperlipidemia, unspecified: Secondary | ICD-10-CM | POA: Diagnosis not present

## 2023-07-27 DIAGNOSIS — N183 Chronic kidney disease, stage 3 unspecified: Secondary | ICD-10-CM | POA: Diagnosis not present

## 2023-07-27 DIAGNOSIS — I129 Hypertensive chronic kidney disease with stage 1 through stage 4 chronic kidney disease, or unspecified chronic kidney disease: Secondary | ICD-10-CM | POA: Diagnosis not present

## 2023-07-28 LAB — LAB REPORT - SCANNED
Calcium: 10.4
EGFR: 30
PTH: 97

## 2023-07-30 NOTE — Progress Notes (Unsigned)
Cardiology Office Note:  .   Date:  07/31/2023  ID:  MACKEY VARRICCHIO, DOB [birth year], MRN [id number] PCP: [name], [name]  [facility] Providers Cardiologist:  Thurmon Fair, MD   History of Present Illness: .   Micheal Walls is a 88 y.o. male with a past medical history of PAD s/p right BKA, hypertension, CKD, diastolic heart failure, OSA on CPAP, history of CVA, type 2 diabetes on insulin.  He is followed by Dr. Royann Shivers and presents today for a follow-up appointment.  Echocardiogram from 07/2020 showed EF 55-60%, no regional wall motion abnormalities, moderate LVH, normal RV function. Carotid ultrasounds from 07/2020 showed 1-39% stenosis in bilateral ICAs.   Patient has diabetes related peripheral artery disease and has been followed vascular surgery. Many  years ago, he underwent right BKA. In 09/2021, he underwent amputation of the left great toe followed by amputation of the left 2nd toe in 11/2021. More recently, underwent amputation of the lefr 3rd, 4th, and 5th toes in 01/2023.   Today, patient presents for a follow up appointment. He denies having any specific cardiac concerns or issues. He denies chest pain, palpitations, and dyspnea. He has a prosthetic R lower leg and has had toe amputations on the left foot due to vascular disease and diabetes. He reports some swelling in his left leg, but denies any discomfort from the swelling. He has been on Lasix (furosemide) for the leg swelling. He also has a history of diabetes and is currently on Ozempic, which he reports is working well for him. His A1c levels have improved and he has lost some weight, although he reports he is starting to gain it back. He reports some balance issues, which limit his physical activity. He uses a CPAP machine for sleep and reports no issues with his breathing.  ROS: Per HPI   Studies Reviewed: .   Cardiac Studies & Procedures    ______________________________________________________________________________________________   STRESS TESTS  MYOCARDIAL PERFUSION IMAGING 09/18/2009   ECHOCARDIOGRAM  ECHOCARDIOGRAM COMPLETE 07/19/2020  Narrative ECHOCARDIOGRAM REPORT    Patient Name:   Micheal Walls Date of Exam: 07/19/2020 Medical Rec #:  [id number]         Height:       73.0 in Accession #:    [id number]        Weight:       275.0 lb Date of Birth:  Feb 03, 1936         BSA:          2.463 m Patient Age:    84 years          BP:           141/72 mmHg Patient Gender: M                 HR:           88 bpm. Exam Location:  Inpatient  Procedure: 2D Echo, Cardiac Doppler and Color Doppler  Indications:    Stroke 434.91 / I163.9  History:        Patient has no prior history of Echocardiogram examinations. CHF, Arrythmias:RBBB; Risk Factors:Hypertension, Dyslipidemia, Diabetes and Sleep Apnea.  Sonographer:    Elmarie Shiley Dance Referring Phys: [id number] MATTHEW M ECKSTAT  IMPRESSIONS   1. Left ventricular ejection fraction, by estimation, is 55 to 60%. The left ventricle has normal function. The left ventricle has no regional wall motion abnormalities. There is moderate left ventricular hypertrophy. Indeterminate diastolic filling due to E-A fusion.  2. Right ventricular systolic function is normal. The right ventricular size is normal. 3. The pericardial effusion is posterior to the left ventricle. 4. The mitral valve is abnormal. Trivial mitral valve regurgitation. 5. The aortic valve is tricuspid. Aortic valve regurgitation is not visualized. 6. Aortic dilatation noted. There is mild dilatation of the aortic root, measuring 42 mm. 7. The inferior vena cava is dilated in size with >50% respiratory variability, suggesting right atrial pressure of 8 mmHg.  Conclusion(s)/Recommendation(s): No intracardiac source of embolism detected on this transthoracic study. A transesophageal echocardiogram is recommended to  exclude cardiac source of embolism if clinically indicated.  FINDINGS Left Ventricle: Left ventricular ejection fraction, by estimation, is 55 to 60%. The left ventricle has normal function. The left ventricle has no regional wall motion abnormalities. The left ventricular internal cavity size was normal in size. There is moderate left ventricular hypertrophy. Indeterminate diastolic filling due to E-A fusion.  Right Ventricle: The right ventricular size is normal. No increase in right ventricular wall thickness. Right ventricular systolic function is normal.  Left Atrium: Left atrial size was normal in size.  Right Atrium: Right atrial size was normal in size.  Pericardium: Trivial pericardial effusion is present. The pericardial effusion is posterior to the left ventricle.  Mitral Valve: The mitral valve is abnormal. Mild mitral annular calcification. Trivial mitral valve regurgitation.  Tricuspid Valve: The tricuspid valve is grossly normal. Tricuspid valve regurgitation is trivial.  Aortic Valve: The aortic valve is tricuspid. Aortic valve regurgitation is not visualized.  Pulmonic Valve: The pulmonic valve was normal in structure. Pulmonic valve regurgitation is not visualized.  Aorta: Aortic dilatation noted. There is mild dilatation of the aortic root, measuring 42 mm.  Venous: The inferior vena cava is dilated in size with greater than 50% respiratory variability, suggesting right atrial pressure of 8 mmHg.  IAS/Shunts: No atrial level shunt detected by color flow Doppler.   LEFT VENTRICLE PLAX 2D LVIDd:         4.20 cm  Diastology LV PW:         1.20 cm  LV e' medial:    11.30 cm/s LV IVS:        1.40 cm  LV E/e' medial:  8.0 LVOT diam:     2.30 cm  LV e' lateral:   10.20 cm/s LV SV:         66       LV E/e' lateral: 8.8 LV SV Index:   27 LVOT Area:     4.15 cm   RIGHT VENTRICLE             IVC RV Basal diam:  2.10 cm     IVC diam: 2.00 cm RV S prime:     12.80  cm/s TAPSE (M-mode): 1.6 cm  LEFT ATRIUM             Index       RIGHT ATRIUM           Index LA diam:        3.40 cm 1.38 cm/m  RA Area:     11.60 cm LA Vol (A2C):   70.5 ml 28.62 ml/m RA Volume:   22.90 ml  9.30 ml/m LA Vol (A4C):   56.3 ml 22.85 ml/m LA Biplane Vol: 64.4 ml 26.14 ml/m AORTIC VALVE LVOT Vmax:   85.80 cm/s LVOT Vmean:  56.800 cm/s LVOT VTI:    0.160 m  AORTA Ao Root diam: 4.20 cm Ao Asc  diam:  3.10 cm  MITRAL VALVE MV Area (PHT): 4.15 cm    SHUNTS MV Decel Time: 183 msec    Systemic VTI:  0.16 m MV E velocity: 90.15 cm/s  Systemic Diam: 2.30 cm MV A velocity: 72.20 cm/s MV E/A ratio:  1.25  Zoila Shutter MD Electronically signed by Zoila Shutter MD Signature Date/Time: 07/19/2020/12:39:29 PM    Final          ______________________________________________________________________________________________      Risk Assessment/Calculations:             Physical Exam:   VS:  BP 128/60 (BP Location: Left Arm, Patient Position: Sitting, Cuff Size: Large)   Pulse 77   Ht 6\' 1"  (1.854 m)   Wt 263 lb (119.3 kg)   SpO2 96%   BMI 34.70 kg/m    Wt Readings from Last 3 Encounters:  07/31/23 263 lb (119.3 kg)  06/02/23 250 lb (113.4 kg)  03/27/23 250 lb (113.4 kg)    GEN: Elderly male, sitting comfortably in his wheelchair in no acute distress  NECK: No JVD; No carotid bruits CARDIAC:  RRR, no murmurs, rubs, gallops. Radial pulses 2+ bilaterally  RESPIRATORY:  Crackles in bilateral lung bases, normal WOB on room air  ABDOMEN: Soft, non-tender, non-distended  EXTREMITIES:  1+ edema in left lower extremity, s/p right BKA; No deformity   ASSESSMENT AND PLAN: .    Peripheral Artery Disease  Carotid Artery Disease  - Carotid ultrasounds from 07/2020 showed 1-39% stenosis bilaterally. His PAD is followed by vascular surgery. Underwent R BKA several years ago. Had his left great toe amputated in 09/2021, his 2nd left toe amputated in 11/2021, and the  remainder of his left toes amputated in 01/2023. Reports that his amputation wounds have healed well and he does not have any other wounds on his left foot  - Continue aspirin 81 mg daily  - Continue crestor 20 mg daily   Lower extremity swelling  - Patient has 1+ edema in his left lower leg. Reports that swelling usually goes down overnight and it does not bother him. He has been on lasix - Echo from 07/2020 showed EF 55-60%, no wall motion abnormalities, normal RV function  - Considered increasing lasix for a few days to help with edema. However, the swelling does not bother him very much and he has CKD. Will continue lasix 40 mg daily for now  - Creatinine 2.17 and K 3.9 in 04/2023   HLD  - Lipid panel from 03/2023 showed LDL 47, HDL 32, triglycerides 83, total cholesterol 96 - Continue crestor 20 mg daily   HTN - BP currently well controlled  - Continue diltiazem 240 mg daily, lasix 40 mg daily    Type 2 Dm  - Followed by PCP - A1c 6.3 in 01/2023  - Continue ozempic   CKD  - Followed by [facility]  - Baseline creatinine around 2-2.2   Mild dilatation of the aortic root - Echo from 07/2020 showed mild dilatation of the aortic root measuring 42 mm  - Consider repeating echocardiogram next year for monitoring - BP is well controlled   OSA  - Reports compliance with CPAP   Dispo: Follow up in 1 year with Dr. Royann Shivers   Signed, Jonita Albee, PA-C

## 2023-07-31 ENCOUNTER — Ambulatory Visit: Payer: Medicare Other | Attending: Cardiology | Admitting: Cardiology

## 2023-07-31 VITALS — BP 128/60 | HR 77 | Ht 73.0 in | Wt 263.0 lb

## 2023-07-31 DIAGNOSIS — E785 Hyperlipidemia, unspecified: Secondary | ICD-10-CM | POA: Diagnosis not present

## 2023-07-31 DIAGNOSIS — N184 Chronic kidney disease, stage 4 (severe): Secondary | ICD-10-CM | POA: Diagnosis not present

## 2023-07-31 DIAGNOSIS — Z794 Long term (current) use of insulin: Secondary | ICD-10-CM | POA: Diagnosis not present

## 2023-07-31 DIAGNOSIS — I10 Essential (primary) hypertension: Secondary | ICD-10-CM | POA: Insufficient documentation

## 2023-07-31 DIAGNOSIS — E1122 Type 2 diabetes mellitus with diabetic chronic kidney disease: Secondary | ICD-10-CM | POA: Insufficient documentation

## 2023-07-31 DIAGNOSIS — I739 Peripheral vascular disease, unspecified: Secondary | ICD-10-CM | POA: Diagnosis not present

## 2023-07-31 DIAGNOSIS — I4510 Unspecified right bundle-branch block: Secondary | ICD-10-CM | POA: Diagnosis not present

## 2023-07-31 DIAGNOSIS — G4733 Obstructive sleep apnea (adult) (pediatric): Secondary | ICD-10-CM | POA: Diagnosis not present

## 2023-07-31 NOTE — Patient Instructions (Signed)
Medication Instructions:  Your physician recommends that you continue on your current medications as directed. Please refer to the Current Medication list given to you today.  *If you need a refill on your cardiac medications before your next appointment, please call your pharmacy*   Lab Work: NONE ordered at this time of appointment    Testing/Procedures: NONE ordered at this time of appointment    Follow-Up: At [facility], you and your health needs are our priority.  As part of our continuing mission to provide you with exceptional heart care, we have created designated Provider Care Teams.  These Care Teams include your primary Cardiologist (physician) and Advanced Practice Providers (APPs -  Physician Assistants and Nurse Practitioners) who all work together to provide you with the care you need, when you need it.  We recommend signing up for the patient portal called "MyChart".  Sign up information is provided on this After Visit Summary.  MyChart is used to connect with patients for Virtual Visits (Telemedicine).  Patients are able to view lab/test results, encounter notes, upcoming appointments, etc.  Non-urgent messages can be sent to your provider as well.   To learn more about what you can do with MyChart, go to [email or web address].    Your next appointment:   1 year(s)  Provider:   Thurmon Fair, MD     Other Instructions

## 2023-08-01 ENCOUNTER — Ambulatory Visit: Payer: Medicare Other | Admitting: Nurse Practitioner

## 2023-08-11 ENCOUNTER — Ambulatory Visit: Payer: Medicare Other

## 2023-08-11 DIAGNOSIS — E1151 Type 2 diabetes mellitus with diabetic peripheral angiopathy without gangrene: Secondary | ICD-10-CM

## 2023-08-11 DIAGNOSIS — M2141 Flat foot [pes planus] (acquired), right foot: Secondary | ICD-10-CM

## 2023-08-11 DIAGNOSIS — Z89422 Acquired absence of other left toe(s): Secondary | ICD-10-CM

## 2023-08-11 NOTE — Progress Notes (Signed)
 Patient presents to the office today for diabetic shoe and insole measuring.  Patient was measured with brannock device to determine size and width for 1 pair of extra depth shoes Left toe filler patient is BKA right   Documentation of medical necessity will be sent to patient's treating diabetic doctor to verify and sign.   Patient's diabetic provider: Dorothyann Peng   Shoes and insoles will be ordered at that time and patient will be notified for an appointment for fitting when they arrive.   Brannock measurement: 11 Shoe choice:   11XWD Shoe size ordered: [id number] / [id number]  Ppw / ABN signed

## 2023-08-14 DIAGNOSIS — H401132 Primary open-angle glaucoma, bilateral, moderate stage: Secondary | ICD-10-CM | POA: Diagnosis not present

## 2023-08-24 ENCOUNTER — Ambulatory Visit: Payer: Medicare Other | Admitting: Nurse Practitioner

## 2023-09-05 ENCOUNTER — Encounter: Payer: Self-pay | Admitting: Nurse Practitioner

## 2023-09-05 ENCOUNTER — Ambulatory Visit (INDEPENDENT_AMBULATORY_CARE_PROVIDER_SITE_OTHER): Admitting: Nurse Practitioner

## 2023-09-05 VITALS — BP 140/70 | HR 85 | Temp 97.9°F | Ht 73.0 in | Wt 265.0 lb

## 2023-09-05 DIAGNOSIS — Z6834 Body mass index (BMI) 34.0-34.9, adult: Secondary | ICD-10-CM

## 2023-09-05 DIAGNOSIS — E66811 Obesity, class 1: Secondary | ICD-10-CM

## 2023-09-05 DIAGNOSIS — Z89511 Acquired absence of right leg below knee: Secondary | ICD-10-CM | POA: Diagnosis not present

## 2023-09-05 DIAGNOSIS — I5032 Chronic diastolic (congestive) heart failure: Secondary | ICD-10-CM | POA: Diagnosis not present

## 2023-09-05 DIAGNOSIS — E782 Mixed hyperlipidemia: Secondary | ICD-10-CM

## 2023-09-05 DIAGNOSIS — E113592 Type 2 diabetes mellitus with proliferative diabetic retinopathy without macular edema, left eye: Secondary | ICD-10-CM

## 2023-09-05 DIAGNOSIS — I739 Peripheral vascular disease, unspecified: Secondary | ICD-10-CM

## 2023-09-05 DIAGNOSIS — E1122 Type 2 diabetes mellitus with diabetic chronic kidney disease: Secondary | ICD-10-CM | POA: Diagnosis not present

## 2023-09-05 DIAGNOSIS — Z79899 Other long term (current) drug therapy: Secondary | ICD-10-CM

## 2023-09-05 DIAGNOSIS — I110 Hypertensive heart disease with heart failure: Secondary | ICD-10-CM | POA: Diagnosis not present

## 2023-09-05 DIAGNOSIS — N184 Chronic kidney disease, stage 4 (severe): Secondary | ICD-10-CM | POA: Diagnosis not present

## 2023-09-05 DIAGNOSIS — Z89519 Acquired absence of unspecified leg below knee: Secondary | ICD-10-CM

## 2023-09-05 DIAGNOSIS — E6609 Other obesity due to excess calories: Secondary | ICD-10-CM

## 2023-09-05 DIAGNOSIS — Z794 Long term (current) use of insulin: Secondary | ICD-10-CM

## 2023-09-05 NOTE — Progress Notes (Signed)
 I,Micheal Walls, CMA,acting as a Neurosurgeon for [employer], [name].,have documented all relevant documentation on the behalf of Micheal Felts, [name],as directed by  Micheal Felts, [name] while in the presence of Micheal Felts, [name].  Subjective:  Patient ID: Micheal Walls , male    DOB: [birth year] , 88 y.o.   MRN: [id number]  Chief Complaint  Patient presents with   Diabetes   Hypertension    HPI  Patient presents today for a bp, chol, and dm follow up, Patient reports compliance with medication. Patient denies any chest pain, SOB, or headaches. Patient has no concerns today. He has not heard anything from podiatry for his diabetic shoes. He has been to Nephrology and Cardiology and ophthalmology no changes.   Diabetes He presents for his follow-up diabetic visit. He has type 2 diabetes mellitus. His disease course has been improving. Pertinent negatives for hypoglycemia include no confusion, dizziness, headaches or nervousness/anxiousness. There are no diabetic associated symptoms. Pertinent negatives for diabetes include no fatigue, no polydipsia, no polyphagia and no polyuria. There are no hypoglycemic complications. Symptoms are improving. Diabetic complications include peripheral neuropathy, PVD and retinopathy. Pertinent negatives for diabetic complications include no autonomic neuropathy. Risk factors for coronary artery disease include sedentary lifestyle and obesity. Current diabetic treatment includes oral agent (monotherapy) (no longer on Tresiba since December). He is compliant with treatment all of the time. His weight is stable. He is following a diabetic diet. When asked about meal planning, he reported none. He has not had a previous visit with a dietitian. He rarely participates in exercise. His home blood glucose trend is decreasing steadily. (Blood sugars ranging 81-145 - average around 130's. ) An ACE inhibitor/angiotensin II receptor blocker is being taken. He sees a podiatrist (Dr  Donzetta Matters - continues to be followed for his left toe amputation 2nd toe).Eye exam is current (Dr. Luciana Axe 06/24/2021).  Hypertension Pertinent negatives include no headaches. Hypertensive end-organ damage includes PVD and retinopathy.     Past Medical History:  Diagnosis Date   Acute metabolic encephalopathy 01/16/2020   Amputated below knee [facility])    right   Anemia    Aspiration pneumonia (HCC) 11/02/2019   Cataract 2019   Chronic kidney disease 2006   Diastolic heart failure (HCC)    Diverticulosis    DM (diabetes mellitus) (HCC)    GERD (gastroesophageal reflux disease) 1995   Gout    Hyperlipemia    OSA on CPAP    RBBB    Sepsis (HCC) 11/02/2019   Sleep apnea 2000   Stroke [facility]) 2002   Systemic hypertension    Thyroid disease 2022     Family History  Problem Relation Age of Onset   Diabetes Mother    Heart attack Father    Cancer Sister    Asthma Daughter      Current Outpatient Medications:    allopurinol (ZYLOPRIM) 100 MG tablet, Take 100 mg by mouth 2 (two) times daily., Disp: , Rfl:    aspirin EC 81 MG tablet, Take 81 mg by mouth daily. Swallow whole., Disp: , Rfl:    Castellani Paint 1.5 % LIQD, Apply between toes daily, Disp: 30 mL, Rfl: 0   colchicine 0.6 MG tablet, Take 0.6 mg by mouth once a week., Disp: , Rfl:    Continuous Glucose Sensor (FREESTYLE LIBRE 14 DAY SENSOR) MISC, Use as directed to check blood sugars, Disp: 6 each, Rfl: 1   diltiazem (CARDIZEM CD) 240 MG 24 hr capsule, TAKE ONE CAPSULE  BY MOUTH ONCE DAILY, Disp: 90 capsule, Rfl: 3   dorzolamide (TRUSOPT) 2 % ophthalmic solution, Place 1 drop into both eyes 2 (two) times daily., Disp: , Rfl:    ferrous sulfate 325 (65 FE) MG tablet, Take 1 tablet (325 mg total) by mouth 2 (two) times daily with a meal., Disp: , Rfl: 3   furosemide (LASIX) 40 MG tablet, TAKE ONE (1) TABLET BY MOUTH TWICE DAILY *REFILL REQUEST* (Patient taking differently: TAKE 2 TABS BY MOUTH IN THE MORNINGS AND 1 TAB BY MOUTH AT  NIGHT), Disp: 60 tablet, Rfl: 10   gabapentin (NEURONTIN) 100 MG capsule, TAKE TWO (2) CAPSULES BY MOUTH IN THE MORNING, TAKE 1 CAPSULE AT NOON AND 1 CAPSULE EVERY EVENING *REFILL REQUEST*, Disp: 150 capsule, Rfl: 10   glucose blood (FREESTYLE PRECISION NEO TEST) test strip, Use as instructed, Disp: 100 each, Rfl: 12   latanoprost (XALATAN) 0.005 % ophthalmic solution, Place 1 drop into both eyes at bedtime. , Disp: , Rfl:    Multiple Vitamins-Minerals (MULTIVITAMIN WITH MINERALS) tablet, Take 1 tablet by mouth daily., Disp: , Rfl:    Multiple Vitamins-Minerals (PRESERVISION AREDS) CAPS, Take 2 capsules by mouth daily., Disp: , Rfl:    pantoprazole (PROTONIX) 40 MG tablet, TAKE 1 TABLET BY MOUTH EVERY DAY AT BEDTIME *REFILL REQUEST*, Disp: 30 tablet, Rfl: 10   rosuvastatin (CRESTOR) 20 MG tablet, Take 1 tablet (20 mg total) by mouth at bedtime. KEEP OV., Disp: 90 tablet, Rfl: 0   Semaglutide, 2 MG/DOSE, (OZEMPIC, 2 MG/DOSE,) 8 MG/3ML SOPN, Inject 2 mg into the skin once a week., Disp: 9 mL, Rfl: 1   senna-docusate (SENOKOT-S) 8.6-50 MG tablet, Take 1 tablet by mouth 2 (two) times daily between meals as needed for mild constipation or moderate constipation. (Patient taking differently: Take 1 tablet by mouth 2 (two) times daily as needed (constipation).), Disp: 60 tablet, Rfl: 0   SYNTHROID 25 MCG tablet, Take 1 tablet by mouth Monday - Friday, Disp: 60 tablet, Rfl: 2   tamsulosin (FLOMAX) 0.4 MG CAPS capsule, Take 0.4 mg by mouth daily., Disp: , Rfl:    gentamicin ointment (GARAMYCIN) 0.1 %, APPLY TOPICALLY THREE TIMES A DAY, Disp: 15 g, Rfl: 10   Allergies  Allergen Reactions   Vancomycin Rash     Review of Systems  Constitutional: Negative.  Negative for fatigue.  Eyes: Negative.   Respiratory: Negative.    Cardiovascular: Negative.   Endocrine: Negative for polydipsia, polyphagia and polyuria.  Musculoskeletal: Negative.   Skin: Negative.   Neurological:  Negative for dizziness and  headaches.  Psychiatric/Behavioral: Negative.  Negative for confusion. The patient is not nervous/anxious.      Today's Vitals   09/05/23 1118  BP: (!) 140/70  Pulse: 85  Temp: 97.9 F (36.6 C)  TempSrc: Oral  Weight: 265 lb (120.2 kg)  Height: 6\' 1"  (1.854 m)  PainSc: 0-No pain   Body mass index is 34.96 kg/m.  Wt Readings from Last 3 Encounters:  09/05/23 265 lb (120.2 kg)  07/31/23 263 lb (119.3 kg)  06/02/23 250 lb (113.4 kg)    The ASCVD Risk score (Arnett DK, et al., 2019) failed to calculate for the following reasons:   The 2019 ASCVD risk score is only valid for ages 21 to 34   Risk score cannot be calculated because patient has a medical history suggesting prior/existing ASCVD  Objective:  Physical Exam Vitals and nursing note reviewed.  Constitutional:      General: He is  not in acute distress.    Appearance: Normal appearance. He is obese.  Cardiovascular:     Rate and Rhythm: Normal rate and regular rhythm.     Pulses: Normal pulses.     Heart sounds: Normal heart sounds. No murmur heard. Pulmonary:     Effort: Pulmonary effort is normal. No respiratory distress.     Breath sounds: Normal breath sounds. No wheezing.  Musculoskeletal:     Comments: Left lower extremity is wrapped in ace bandage and dressing to foot ulcer  Skin:    Capillary Refill: Capillary refill takes less than 2 seconds.     Comments: Continues to wear a dressing to his left foot that is being managed by his Podiatrist  Neurological:     General: No focal deficit present.     Mental Status: He is alert and oriented to person, place, and time.     Cranial Nerves: No cranial nerve deficit.     Motor: No weakness.  Psychiatric:        Mood and Affect: Mood normal.        Behavior: Behavior normal.        Thought Content: Thought content normal.        Judgment: Judgment normal.         Assessment And Plan:  Type 2 diabetes mellitus with stage 4 chronic kidney disease, with  long-term current use of insulin (HCC) Assessment & Plan: hgbA1c stable,  Continue current medications.   Orders: -     CMP14+EGFR -     Hemoglobin A1c  Mixed hyperlipidemia Assessment & Plan: Cholesterol levels have stable.  Continue statin  Orders: -     Lipid panel  Hypertensive heart disease with chronic diastolic congestive heart failure (HCC) Assessment & Plan: Blood pressure is fairly controlled, continue current medications  Orders: -     CMP14+EGFR  PAD (peripheral artery disease) (HCC) Assessment & Plan: Continue statin.   Class 1 obesity due to excess calories with body mass index (BMI) of 34.0 to 34.9 in adult, unspecified whether serious comorbidity present Assessment & Plan: He is encouraged to strive for BMI less than 30 to decrease cardiac risk. Advised to aim for at least 150 minutes of exercise per week.    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  Patient was given opportunity to ask questions. Patient verbalized understanding of the plan and was able to repeat key elements of the plan. All questions were answered to their satisfaction.   Jeanell Sparrow, [name], have reviewed all documentation for this visit. The documentation on 09/05/23 for the exam, diagnosis, procedures, and orders are all accurate and complete.    IF YOU HAVE BEEN REFERRED TO A SPECIALIST, IT MAY TAKE 1-2 WEEKS TO SCHEDULE/PROCESS THE REFERRAL. IF YOU HAVE NOT HEARD FROM US/SPECIALIST IN TWO WEEKS, PLEASE GIVE [place] A CALL AT [phone].

## 2023-09-05 NOTE — Patient Instructions (Signed)

## 2023-09-06 LAB — CMP14+EGFR
ALT: 28 IU/L (ref 0–44)
AST: 28 IU/L (ref 0–40)
Albumin: 4.1 g/dL (ref 3.7–4.7)
Alkaline Phosphatase: 98 IU/L (ref 44–121)
BUN/Creatinine Ratio: 30 — ABNORMAL HIGH (ref 10–24)
BUN: 63 mg/dL — ABNORMAL HIGH (ref 8–27)
Bilirubin Total: 0.3 mg/dL (ref 0.0–1.2)
CO2: 24 mmol/L (ref 20–29)
Calcium: 10.2 mg/dL (ref 8.6–10.2)
Chloride: 103 mmol/L (ref 96–106)
Creatinine, Ser: 2.13 mg/dL — ABNORMAL HIGH (ref 0.76–1.27)
Globulin, Total: 3 g/dL (ref 1.5–4.5)
Glucose: 102 mg/dL — ABNORMAL HIGH (ref 70–99)
Potassium: 3.9 mmol/L (ref 3.5–5.2)
Sodium: 143 mmol/L (ref 134–144)
Total Protein: 7.1 g/dL (ref 6.0–8.5)
eGFR: 30 mL/min/{1.73_m2} — ABNORMAL LOW (ref >59)

## 2023-09-06 LAB — CBC
Hematocrit: 36.6 % — ABNORMAL LOW (ref 37.5–51.0)
Hemoglobin: 12.2 g/dL — ABNORMAL LOW (ref 13.0–17.7)
MCH: 28.9 pg (ref 26.6–33.0)
MCHC: 33.3 g/dL (ref 31.5–35.7)
MCV: 87 fL (ref 79–97)
Platelets: 214 10*3/uL (ref 150–450)
RBC: 4.22 x10E6/uL (ref 4.14–5.80)
RDW: 14.7 % (ref 11.6–15.4)
WBC: 6.3 10*3/uL (ref 3.4–10.8)

## 2023-09-06 LAB — LIPID PANEL
Chol/HDL Ratio: 3.2 ratio (ref 0.0–5.0)
Cholesterol, Total: 96 mg/dL — ABNORMAL LOW (ref 100–199)
HDL: 30 mg/dL — ABNORMAL LOW (ref >39)
LDL Chol Calc (NIH): 45 mg/dL (ref 0–99)
Triglycerides: 110 mg/dL (ref 0–149)
VLDL Cholesterol Cal: 21 mg/dL (ref 5–40)

## 2023-09-06 LAB — HEMOGLOBIN A1C
Est. average glucose Bld gHb Est-mCnc: 140 mg/dL
Hgb A1c MFr Bld: 6.5 % — ABNORMAL HIGH (ref 4.8–5.6)

## 2023-09-17 ENCOUNTER — Encounter: Payer: Self-pay | Admitting: Nurse Practitioner

## 2023-09-17 DIAGNOSIS — I110 Hypertensive heart disease with heart failure: Secondary | ICD-10-CM | POA: Insufficient documentation

## 2023-09-17 DIAGNOSIS — E6609 Other obesity due to excess calories: Secondary | ICD-10-CM | POA: Insufficient documentation

## 2023-09-17 DIAGNOSIS — I5032 Chronic diastolic (congestive) heart failure: Secondary | ICD-10-CM | POA: Insufficient documentation

## 2023-09-17 NOTE — Assessment & Plan Note (Signed)
Uses motorized wheelchair

## 2023-09-17 NOTE — Assessment & Plan Note (Signed)
 He is encouraged to strive for BMI less than 30 to decrease cardiac risk. Advised to aim for at least 150 minutes of exercise per week.

## 2023-09-17 NOTE — Assessment & Plan Note (Signed)
Cholesterol levels have stable.  Continue statin

## 2023-09-17 NOTE — Assessment & Plan Note (Signed)
Blood pressure is fairly controlled, continue current medications.  

## 2023-09-17 NOTE — Assessment & Plan Note (Signed)
 hgbA1c stable,  Continue current medications.

## 2023-09-17 NOTE — Assessment & Plan Note (Signed)
 Continue statin.

## 2023-09-17 NOTE — Assessment & Plan Note (Signed)
Continue f/u with opthalmology.

## 2023-09-25 ENCOUNTER — Telehealth: Payer: Self-pay

## 2023-09-25 NOTE — Telephone Encounter (Signed)
 Shoes and Left TF in, called patient then realized shoe appt already set for 5/29 9:30

## 2023-10-12 ENCOUNTER — Ambulatory Visit: Payer: Medicare Other | Admitting: Podiatry

## 2023-10-19 ENCOUNTER — Ambulatory Visit (INDEPENDENT_AMBULATORY_CARE_PROVIDER_SITE_OTHER): Admitting: Podiatry

## 2023-10-19 ENCOUNTER — Encounter: Payer: Self-pay | Admitting: Podiatry

## 2023-10-19 DIAGNOSIS — I890 Lymphedema, not elsewhere classified: Secondary | ICD-10-CM | POA: Diagnosis not present

## 2023-10-19 DIAGNOSIS — E1151 Type 2 diabetes mellitus with diabetic peripheral angiopathy without gangrene: Secondary | ICD-10-CM | POA: Diagnosis not present

## 2023-10-19 DIAGNOSIS — I739 Peripheral vascular disease, unspecified: Secondary | ICD-10-CM

## 2023-10-19 DIAGNOSIS — Z89511 Acquired absence of right leg below knee: Secondary | ICD-10-CM | POA: Diagnosis not present

## 2023-10-19 NOTE — Progress Notes (Signed)
  Subjective:  Patient ID: Micheal Walls, male    DOB: [birth year],  MRN: [id number]  Chief Complaint  Patient presents with   [facility]    Left foot all toes amputated. Here for diabetic foot exam and wound checks. Left lower extremity is extremely swollen, patient reports not wearing compression stockings recently. He would like to be measured for these again today if possible. His last A1c was 6.7. Takes ASA 81 mg.    88 y.o. male presents for diabetic foot check. History confirmed with patient.  He has history of right sided below-knee amputation, left side has had amputation of all toes at the level of the metatarsophalangeal joints..  Patient does have a history of T2DM. Patient does not have callus present at this point.  History of PVD, CVA, CHF, CKD.  He is complaining of severe left leg swelling and is requesting prescription for compression stockings.  He has worn these in the past.  Objective:  Physical Exam: warm, good capillary refill History of amputation to all toes the left foot DP pulse palpable, PT pulse nonpalpable, protective sensation absent, and vibratory sensation diminished, +2 to +3 pitting edema left lower extremity.  Skin is indurated. Left Foot: No signs of hyperkeratotic buildup to amputation site.  Well-healed surgical scar.  Left leg edematous.  Mid calf circumference 42 cm. Right Foot: History of below-knee amputation  Assessment:   1. Type II diabetes mellitus with peripheral circulatory disorder (HCC)   2. Lymphedema of left leg   3. PAD (peripheral artery disease) (HCC)   4. History of below-knee amputation of right lower extremity (HCC)      Plan:  Patient was evaluated and treated and all questions answered.  # Diabetes with peripheral circulatory disorder, history of amputation - Patient educated on diabetes. Discussed proper diabetic foot care and discussed risks and complications of disease. Educated patient in depth on reasons to return to the  office immediately should he/she discover anything concerning or new on the feet. All questions answered. Discussed proper shoes as well.  -Patient states he does have toe filler and shoes, primarily uses a wheelchair -No callus at this point, we can see patient back in 3 months to check on his foot, at this point time if no complications or areas of concern for ulceration can consider pushing patient out to every 6 months for evaluation.  # Lymphedema left lower extremity -Advised patient to speak with his cardiologist regarding the extent of left lower extremity edema. -He is requesting compression stocking prescription today.  Prescription sent to [facility] clinic for this for compression stockings for left leg lymphedema.  Mid calf circumference 42 cm.  Would need 30 to 40 mmHg compression -Did discuss potential risk with using compression stockings.  He experiences any chest pain or shortness of breath needs to speak with his cardiologist immediately discontinue the use of any compression stockings.  Return in about 3 months (around 01/19/2024) for Diabetic foot check.         Eve Hinders, DPM [facility]

## 2023-10-19 NOTE — Patient Instructions (Addendum)
 I have ordered compression stockings and referred you to [facility] for this. If you do not hear for them about scheduling within the next 1 week, or you have any questions please give us  a call at [phone].   Stop using compression stockings immediately if you develop any chest pain or shortness of breath as this could be a sign that you are overloading your heart.  Contact your cardiologist immediately if this develops

## 2023-10-24 ENCOUNTER — Other Ambulatory Visit: Payer: Self-pay

## 2023-10-24 DIAGNOSIS — H353122 Nonexudative age-related macular degeneration, left eye, intermediate dry stage: Secondary | ICD-10-CM | POA: Diagnosis not present

## 2023-10-24 DIAGNOSIS — H35372 Puckering of macula, left eye: Secondary | ICD-10-CM | POA: Diagnosis not present

## 2023-10-24 DIAGNOSIS — E113592 Type 2 diabetes mellitus with proliferative diabetic retinopathy without macular edema, left eye: Secondary | ICD-10-CM | POA: Diagnosis not present

## 2023-10-24 DIAGNOSIS — H35022 Exudative retinopathy, left eye: Secondary | ICD-10-CM | POA: Diagnosis not present

## 2023-10-24 DIAGNOSIS — E113591 Type 2 diabetes mellitus with proliferative diabetic retinopathy without macular edema, right eye: Secondary | ICD-10-CM | POA: Diagnosis not present

## 2023-10-24 DIAGNOSIS — H353113 Nonexudative age-related macular degeneration, right eye, advanced atrophic without subfoveal involvement: Secondary | ICD-10-CM | POA: Diagnosis not present

## 2023-10-24 DIAGNOSIS — E039 Hypothyroidism, unspecified: Secondary | ICD-10-CM

## 2023-10-24 LAB — HM DIABETES EYE EXAM

## 2023-10-24 MED ORDER — SYNTHROID 25 MCG PO TABS
ORAL_TABLET | ORAL | 2 refills | Status: AC
Start: 2023-10-24 — End: ?

## 2023-10-26 ENCOUNTER — Other Ambulatory Visit: Payer: Self-pay | Admitting: Cardiovascular Disease

## 2023-10-30 ENCOUNTER — Other Ambulatory Visit: Payer: Self-pay | Admitting: Cardiovascular Disease

## 2023-11-07 ENCOUNTER — Ambulatory Visit: Payer: Medicare Other | Admitting: Hematology and Oncology

## 2023-11-07 ENCOUNTER — Other Ambulatory Visit: Payer: Self-pay | Admitting: Hematology and Oncology

## 2023-11-07 ENCOUNTER — Inpatient Hospital Stay: Payer: Medicare Other | Attending: Nurse Practitioner

## 2023-11-07 DIAGNOSIS — D472 Monoclonal gammopathy: Secondary | ICD-10-CM | POA: Insufficient documentation

## 2023-11-07 LAB — CBC WITH DIFFERENTIAL (CANCER CENTER ONLY)
Abs Immature Granulocytes: 0.02 10*3/uL (ref 0.00–0.07)
Basophils Absolute: 0 10*3/uL (ref 0.0–0.1)
Basophils Relative: 0 %
Eosinophils Absolute: 0.2 10*3/uL (ref 0.0–0.5)
Eosinophils Relative: 4 %
HCT: 35.2 % — ABNORMAL LOW (ref 39.0–52.0)
Hemoglobin: 11.7 g/dL — ABNORMAL LOW (ref 13.0–17.0)
Immature Granulocytes: 0 %
Lymphocytes Relative: 20 %
Lymphs Abs: 1.3 10*3/uL (ref 0.7–4.0)
MCH: 29.1 pg (ref 26.0–34.0)
MCHC: 33.2 g/dL (ref 30.0–36.0)
MCV: 87.6 fL (ref 80.0–100.0)
Monocytes Absolute: 0.6 10*3/uL (ref 0.1–1.0)
Monocytes Relative: 9 %
Neutro Abs: 4.4 10*3/uL (ref 1.7–7.7)
Neutrophils Relative %: 67 %
Platelet Count: 182 10*3/uL (ref 150–400)
RBC: 4.02 MIL/uL — ABNORMAL LOW (ref 4.22–5.81)
RDW: 15.8 % — ABNORMAL HIGH (ref 11.5–15.5)
WBC Count: 6.6 10*3/uL (ref 4.0–10.5)
nRBC: 0 % (ref 0.0–0.2)

## 2023-11-07 LAB — CMP (CANCER CENTER ONLY)
ALT: 29 U/L (ref 0–44)
AST: 26 U/L (ref 15–41)
Albumin: 3.7 g/dL (ref 3.5–5.0)
Alkaline Phosphatase: 87 U/L (ref 38–126)
Anion gap: 7 (ref 5–15)
BUN: 66 mg/dL — ABNORMAL HIGH (ref 8–23)
CO2: 29 mmol/L (ref 22–32)
Calcium: 10.5 mg/dL — ABNORMAL HIGH (ref 8.9–10.3)
Chloride: 104 mmol/L (ref 98–111)
Creatinine: 2.22 mg/dL — ABNORMAL HIGH (ref 0.61–1.24)
GFR, Estimated: 28 mL/min — ABNORMAL LOW (ref >60)
Glucose, Bld: 145 mg/dL — ABNORMAL HIGH (ref 70–99)
Potassium: 4.1 mmol/L (ref 3.5–5.1)
Sodium: 140 mmol/L (ref 135–145)
Total Bilirubin: 0.5 mg/dL (ref 0.0–1.2)
Total Protein: 7.4 g/dL (ref 6.5–8.1)

## 2023-11-07 LAB — LACTATE DEHYDROGENASE: LDH: 129 U/L (ref 98–192)

## 2023-11-08 LAB — KAPPA/LAMBDA LIGHT CHAINS
Kappa free light chain: 140.6 mg/L — ABNORMAL HIGH (ref 3.3–19.4)
Kappa, lambda light chain ratio: 1.39 (ref 0.26–1.65)
Lambda free light chains: 101.3 mg/L — ABNORMAL HIGH (ref 5.7–26.3)

## 2023-11-09 ENCOUNTER — Ambulatory Visit

## 2023-11-09 DIAGNOSIS — M2142 Flat foot [pes planus] (acquired), left foot: Secondary | ICD-10-CM | POA: Diagnosis not present

## 2023-11-09 DIAGNOSIS — Z89511 Acquired absence of right leg below knee: Secondary | ICD-10-CM | POA: Diagnosis not present

## 2023-11-09 DIAGNOSIS — E1151 Type 2 diabetes mellitus with diabetic peripheral angiopathy without gangrene: Secondary | ICD-10-CM

## 2023-11-09 DIAGNOSIS — I890 Lymphedema, not elsewhere classified: Secondary | ICD-10-CM

## 2023-11-09 DIAGNOSIS — M2141 Flat foot [pes planus] (acquired), right foot: Secondary | ICD-10-CM

## 2023-11-09 DIAGNOSIS — Z89422 Acquired absence of other left toe(s): Secondary | ICD-10-CM | POA: Diagnosis not present

## 2023-11-09 NOTE — Progress Notes (Signed)
 Patient presents today to pick up diabetic shoes and insoles.  Patient was dispensed 1 pair of diabetic shoes and 1 toefiller for left shoe  Right  side has BKA   Fit was satisfactory. Instructions for break-in and wear was reviewed and a copy was given to the patient.   Re-appointment for regularly scheduled diabetic foot care visits or if they should experience any trouble with the shoes or insoles.   Britton Cane Cped, CFo CFm

## 2023-11-10 LAB — MULTIPLE MYELOMA PANEL, SERUM
Albumin SerPl Elph-Mcnc: 3.4 g/dL (ref 2.9–4.4)
Albumin/Glob SerPl: 1 (ref 0.7–1.7)
Alpha 1: 0.2 g/dL (ref 0.0–0.4)
Alpha2 Glob SerPl Elph-Mcnc: 0.6 g/dL (ref 0.4–1.0)
B-Globulin SerPl Elph-Mcnc: 1.3 g/dL (ref 0.7–1.3)
Gamma Glob SerPl Elph-Mcnc: 1.4 g/dL (ref 0.4–1.8)
Globulin, Total: 3.5 g/dL (ref 2.2–3.9)
IgA: 679 mg/dL — ABNORMAL HIGH (ref 61–437)
IgG (Immunoglobin G), Serum: 1444 mg/dL (ref 603–1613)
IgM (Immunoglobulin M), Srm: 54 mg/dL (ref 15–143)
M Protein SerPl Elph-Mcnc: 0.4 g/dL — ABNORMAL HIGH
Total Protein ELP: 6.9 g/dL (ref 6.0–8.5)

## 2023-11-14 ENCOUNTER — Inpatient Hospital Stay: Payer: Medicare Other | Attending: Hematology and Oncology | Admitting: Hematology and Oncology

## 2023-11-14 VITALS — BP 133/61 | HR 73 | Temp 98.1°F | Resp 15 | Wt 267.7 lb

## 2023-11-14 DIAGNOSIS — Z87891 Personal history of nicotine dependence: Secondary | ICD-10-CM | POA: Diagnosis not present

## 2023-11-14 DIAGNOSIS — D649 Anemia, unspecified: Secondary | ICD-10-CM | POA: Diagnosis not present

## 2023-11-14 DIAGNOSIS — N183 Chronic kidney disease, stage 3 unspecified: Secondary | ICD-10-CM | POA: Insufficient documentation

## 2023-11-14 DIAGNOSIS — Z809 Family history of malignant neoplasm, unspecified: Secondary | ICD-10-CM | POA: Diagnosis not present

## 2023-11-14 DIAGNOSIS — I129 Hypertensive chronic kidney disease with stage 1 through stage 4 chronic kidney disease, or unspecified chronic kidney disease: Secondary | ICD-10-CM | POA: Insufficient documentation

## 2023-11-14 DIAGNOSIS — E1122 Type 2 diabetes mellitus with diabetic chronic kidney disease: Secondary | ICD-10-CM | POA: Insufficient documentation

## 2023-11-14 DIAGNOSIS — D472 Monoclonal gammopathy: Secondary | ICD-10-CM | POA: Insufficient documentation

## 2023-11-14 NOTE — Progress Notes (Signed)
 [facility] [phone]   [phone]  PROGRESS NOTE  Patient Care Team: Susanna Epley, [name] as PCP - General (General Practice) [name], Karyl Paget, MD as PCP - Cardiology (Cardiology) Merrell Abate, [facility] (Inactive) (Pharmacist)  CHIEF COMPLAINTS/PURPOSE OF CONSULTATION:  IgG Lambda MGUS  HISTORY OF PRESENTING ILLNESS:  Micheal Walls 88 y.o. male returns for a follow up for monoclonal gammopathy after completing initial workup. He is unaccompanied for this visit.   On exam today Micheal Walls reports he has been well overall in the interim since her last visit approximately 6 months ago.  He reports he is had no hospitalizations, ER visits or surgeries.  He reports his energy levels are fine.  He has a about an 8 out of 10.  He notes he is not having any new bone or back pain.  He reports that he has not started any new medications.  He denies any fevers, chills, sweats, nausea, vomiting or diarrhea.  He reports that his appetite remains strong.  He has no questions concerns or complaints today.  A full 10 point ROS is otherwise negative.  MEDICAL HISTORY:  Past Medical History:  Diagnosis Date   Acute metabolic encephalopathy 01/16/2020   Amputated below knee [facility])    right   Anemia    Aspiration pneumonia (HCC) 11/02/2019   Cataract 2019   Chronic kidney disease 2006   Diastolic heart failure (HCC)    Diverticulosis    DM (diabetes mellitus) (HCC)    GERD (gastroesophageal reflux disease) 1995   Gout    Hyperlipemia    OSA on CPAP    RBBB    Sepsis (HCC) 11/02/2019   Sleep apnea 2000   Stroke (HCC) 2002   Systemic hypertension    Thyroid  disease 2022    SURGICAL HISTORY: Past Surgical History:  Procedure Laterality Date   ABDOMINAL AORTOGRAM W/LOWER EXTREMITY Left 08/11/2020   Procedure: ABDOMINAL AORTOGRAM W/LOWER EXTREMITY;  Surgeon: Margherita Shell, MD;  Location: [facility] INVASIVE CV LAB;  Service: Cardiovascular;  Laterality: Left;    ABDOMINAL AORTOGRAM W/LOWER EXTREMITY N/A 02/26/2021   Procedure: ABDOMINAL AORTOGRAM W/LOWER EXTREMITY;  Surgeon: Carlene Che, MD;  Location: [facility] INVASIVE CV LAB;  Service: Cardiovascular;  Laterality: N/A;   AMPUTATION Left 09/14/2020   Procedure: AMPUTATION LEFT GREAT TOE;  Surgeon: Floyce Hutching, DPM;  Location: [facility] OR;  Service: Podiatry;  Laterality: Left;   AMPUTATION Left 02/07/2023   Procedure: AMPUTATION TOE 3RD,4TH, and 5TH;  Surgeon: Velma Ghazi, DPM;  Location: [facility] OR;  Service: Orthopedics/Podiatry;  Laterality: Left;   AMPUTATION TOE Left 12/03/2021   Procedure: AMPUTATION TOE, LEFT second;  Surgeon: Jennefer Moats, DPM;  Location: [facility] OR;  Service: Podiatry;  Laterality: Left;   EYE SURGERY  May & July 2019   One eye at a time   LEG AMPUTATION BELOW KNEE     right   NM MYOCAR PERF WALL MOTION  09/18/2009   no ischemia   PERIPHERAL VASCULAR BALLOON ANGIOPLASTY Left 08/11/2020   Procedure: PERIPHERAL VASCULAR BALLOON ANGIOPLASTY;  Surgeon: Margherita Shell, MD;  Location: [facility];  Service: Cardiovascular;  Laterality: Left;  AT   PERIPHERAL VASCULAR INTERVENTION Left 02/26/2021   Procedure: PERIPHERAL VASCULAR INTERVENTION;  Surgeon: Carlene Che, MD;  Location: [facility];  Service: Cardiovascular;  Laterality: Left;    SOCIAL HISTORY: Social History   Socioeconomic History   Marital status: Married    Spouse name: Not  on file   Number of children: Not on file   Years of education: Not on file   Highest education level: 12th grade  Occupational History   Occupation: retired  Tobacco Use   Smoking status: Former    Current packs/day: 0.00    Average packs/day: 1 pack/day for 25.0 years (25.0 ttl pk-yrs)    Types: Cigars, Cigarettes    Quit date: 06/12/2001    Years since quitting: 22.4   Smokeless tobacco: Never  Vaping Use   Vaping status: Former  Substance and Sexual Activity   Alcohol  use: No   Drug use: No   Sexual  activity: Not Currently    Birth control/protection: None  Other Topics Concern   Not on file  Social History Narrative   Not on file   Social Drivers of Health   Financial Resource Strain: Low Risk  (08/20/2023)   Overall Financial Resource Strain (CARDIA)    Difficulty of Paying Living Expenses: Not hard at all  Food Insecurity: No Food Insecurity (08/20/2023)   Hunger Vital Sign    Worried About Running Out of Food in the Last Year: Never true    Ran Out of Food in the Last Year: Never true  Transportation Needs: Unmet Transportation Needs (08/20/2023)   PRAPARE - Administrator, Civil Service (Medical): Yes    Lack of Transportation (Non-Medical): No  Physical Activity: Inactive (08/20/2023)   Exercise Vital Sign    Days of Exercise per Week: 0 days    Minutes of Exercise per Session: 0 min  Stress: No Stress Concern Present (08/20/2023)   [employer] of Occupational Health - Occupational Stress Questionnaire    Feeling of Stress : Only a little  Social Connections: Moderately Integrated (08/20/2023)   Social Connection and Isolation Panel [NHANES]    Frequency of Communication with Friends and Family: More than three times a week    Frequency of Social Gatherings with Friends and Family: Once a week    Attends Religious Services: Never    Database administrator or Organizations: Yes    Attends Banker Meetings: Never    Marital Status: Married  Recent Concern: Social Connections - Moderately Isolated (06/02/2023)   Social Connection and Isolation Panel [NHANES]    Frequency of Communication with Friends and Family: More than three times a week    Frequency of Social Gatherings with Friends and Family: More than three times a week    Attends Religious Services: Never    Database administrator or Organizations: No    Attends Banker Meetings: Never    Marital Status: Married  Catering manager Violence: Not At Risk (06/02/2023)    Humiliation, Afraid, Rape, and Kick questionnaire    Fear of Current or Ex-Partner: No    Emotionally Abused: No    Physically Abused: No    Sexually Abused: No    FAMILY HISTORY: Family History  Problem Relation Age of Onset   Diabetes Mother    Heart attack Father    Cancer Sister    Asthma Daughter     ALLERGIES:  is allergic to vancomycin .  MEDICATIONS:  Current Outpatient Medications  Medication Sig Dispense Refill   allopurinol  (ZYLOPRIM ) 100 MG tablet Take 100 mg by mouth 2 (two) times daily.     aspirin  EC 81 MG tablet Take 81 mg by mouth daily. Swallow whole.     Castellani Paint 1.5 % LIQD Apply between toes daily (Patient  not taking: Reported on 11/14/2023) 30 mL 0   colchicine  0.6 MG tablet Take 0.6 mg by mouth once a week.     Continuous Glucose Sensor (FREESTYLE LIBRE 14 DAY SENSOR) MISC Use as directed to check blood sugars 6 each 1   diltiazem  (CARDIZEM  CD) 240 MG 24 hr capsule TAKE 1 CAPSULE BY MOUTH ONCE DAILY 90 capsule 3   dorzolamide  (TRUSOPT ) 2 % ophthalmic solution Place 1 drop into both eyes 2 (two) times daily.     ferrous sulfate  325 (65 FE) MG tablet Take 1 tablet (325 mg total) by mouth 2 (two) times daily with a meal.  3   furosemide  (LASIX ) 40 MG tablet TAKE ONE (1) TABLET BY MOUTH TWICE DAILY *REFILL REQUEST* (Patient taking differently: TAKE 2 TABS BY MOUTH IN THE MORNINGS AND 1 TAB BY MOUTH AT NIGHT) 60 tablet 10   gabapentin  (NEURONTIN ) 100 MG capsule TAKE TWO (2) CAPSULES BY MOUTH IN THE MORNING, TAKE 1 CAPSULE AT NOON AND 1 CAPSULE EVERY EVENING *REFILL REQUEST* 150 capsule 10   glucose blood (FREESTYLE PRECISION NEO TEST) test strip Use as instructed 100 each 12   latanoprost  (XALATAN ) 0.005 % ophthalmic solution Place 1 drop into both eyes at bedtime.      Multiple Vitamins-Minerals (MULTIVITAMIN WITH MINERALS) tablet Take 1 tablet by mouth daily.     Multiple Vitamins-Minerals (PRESERVISION AREDS) CAPS Take 2 capsules by mouth daily.      pantoprazole  (PROTONIX ) 40 MG tablet TAKE 1 TABLET BY MOUTH EVERY DAY AT BEDTIME *REFILL REQUEST* 30 tablet 10   rosuvastatin  (CRESTOR ) 20 MG tablet TAKE 1 TABLET (20MG  TOTAL) BY MOUTH AT BEDTIME 90 tablet 3   Semaglutide , 2 MG/DOSE, (OZEMPIC , 2 MG/DOSE,) 8 MG/3ML SOPN Inject 2 mg into the skin once a week. 9 mL 1   senna-docusate (SENOKOT-S) 8.6-50 MG tablet Take 1 tablet by mouth 2 (two) times daily between meals as needed for mild constipation or moderate constipation. (Patient taking differently: Take 1 tablet by mouth 2 (two) times daily as needed (constipation).) 60 tablet 0   SYNTHROID  25 MCG tablet Take 1 tablet by mouth Monday - Friday 60 tablet 2   tamsulosin  (FLOMAX ) 0.4 MG CAPS capsule Take 0.4 mg by mouth daily.     No current facility-administered medications for this visit.    REVIEW OF SYSTEMS:   Constitutional: ( - ) fevers, ( - )  chills , ( - ) night sweats Eyes: ( - ) blurriness of vision, ( - ) double vision, ( - ) watery eyes Ears, nose, mouth, throat, and face: ( - ) mucositis, ( - ) sore throat Respiratory: ( - ) cough, ( - ) dyspnea, ( - ) wheezes Cardiovascular: ( - ) palpitation, ( - ) chest discomfort, ( - ) lower extremity swelling Gastrointestinal:  ( - ) nausea, ( - ) heartburn, ( - ) change in bowel habits Skin: ( - ) abnormal skin rashes Lymphatics: ( - ) new lymphadenopathy, ( - ) easy bruising Neurological: ( - ) numbness, ( - ) tingling, ( - ) new weaknesses Behavioral/Psych: ( - ) mood change, ( - ) new changes  All other systems were reviewed with the patient and are negative.  PHYSICAL EXAMINATION: ECOG PERFORMANCE STATUS: 1 - Symptomatic but completely ambulatory  Vitals:   11/14/23 1027  BP: 133/61  Pulse: 73  Resp: 15  Temp: 98.1 F (36.7 C)  SpO2: 98%     Filed Weights   11/14/23 1027  Weight: 267 lb 11.2 oz (121.4 kg)      GENERAL: well appearing elderly African-American male in NAD, patient was in wheelchair during visit.    SKIN: skin color, texture, turgor are normal, no rashes or significant lesions EYES: conjunctiva are pink and non-injected, sclera clear LUNGS: clear to auscultation and percussion with normal breathing effort HEART: regular rate & rhythm and no murmurs and no lower extremity edema PSYCH: alert & oriented x 3, fluent speech NEURO: no focal motor/sensory deficits  LABORATORY DATA:  I have reviewed the data as listed    Latest Ref Rng & Units 11/07/2023    9:43 AM 09/05/2023   12:13 PM 05/01/2023   10:03 AM  CBC  WBC 4.0 - 10.5 K/uL 6.6  6.3  6.2   Hemoglobin 13.0 - 17.0 g/dL 81.1  91.4  78.2   Hematocrit 39.0 - 52.0 % 35.2  36.6  35.2   Platelets 150 - 400 K/uL 182  214  184        Latest Ref Rng & Units 11/07/2023    9:43 AM 09/05/2023   12:13 PM 07/27/2023   12:00 AM  CMP  Glucose 70 - 99 mg/dL 956  213    BUN 8 - 23 mg/dL 66  63    Creatinine 0.86 - 1.24 mg/dL 5.78  4.69    Sodium 629 - 145 mmol/L 140  143    Potassium 3.5 - 5.1 mmol/L 4.1  3.9    Chloride 98 - 111 mmol/L 104  103    CO2 22 - 32 mmol/L 29  24    Calcium  8.9 - 10.3 mg/dL 52.8  41.3  24.4      Total Protein 6.5 - 8.1 g/dL 7.4  7.1    Total Bilirubin 0.0 - 1.2 mg/dL 0.5  0.3    Alkaline Phos 38 - 126 U/L 87  98    AST 15 - 41 U/L 26  28    ALT 0 - 44 U/L 29  28       This result is from an external source.    RADIOGRAPHIC STUDIES: No results found.  ASSESSMENT & PLAN Micheal Walls is a 88 y.o. male who presents for a follow up for monoclonal gammopathy.   #MGUS: --SPEP from 04/26/2022 detected M protein measuring 0.3 g/dL. Immunofixation revealed monoclonal free lambda light chain. Kappa free light chains 123.1 mg/L, Lambda free light chains 81.9 mg/L and Ratio 1.50.  --Bone survey from 12/30/2021 showed no lytic or destructive lesions.  --Bone marrow biopsy from 01/17/2022 showed 9% plasma cells.  --UPEP from 01/17/2022 showed no evidence of M protein.  PLAN:  -- Labs today show white blood cell  count 6.6, Hgb 11.7, MCV 87.6, Plt 182  --Above findings are most consistent with MGUS.  Has been stable for 2 years, will extend visits out to yearly. --RTC in 12 months with repeat labs 1 week prior.   #Chronic kidney disease, stage III: --Under the care of nephrologist, Dr. Nicolas Barren at [facility].  --CKD felt to be secondary to diabetes and hypertension --Recommended considering kidney biopsy if there is suspicion for paraproteinemia as underlying cause of CKD.  --Most recent creatinine level has been stable at 2.22  #Chronic normocytic anemia: --Likely secondary to chronic kidney disease, stage III +/- paraproteinemia.  --Workup from 12/13/2021 showed no evidence of vitamin B12, folate and iron deficiency.  --Most recent labs show stable anemia with Hgb 11.7  No orders of  the defined types were placed in this encounter.   All questions were answered. The patient knows to call the clinic with any problems, questions or concerns.  I have spent a total of 25 minutes minutes of face-to-face and non-face-to-face time, preparing to see the patient, obtaining and/or reviewing separately obtained history, performing a medically appropriate examination, counseling and educating the patient, documenting clinical information in the electronic health record,  and care coordination.    Rogerio Clay, MD Department of Hematology/Oncology [facility] Phone: [phone] Pager: [phone] Email: [email or web address]

## 2023-11-17 ENCOUNTER — Encounter: Payer: Self-pay | Admitting: Podiatry

## 2023-12-10 ENCOUNTER — Encounter (HOSPITAL_COMMUNITY): Payer: Self-pay | Admitting: Emergency Medicine

## 2023-12-10 ENCOUNTER — Inpatient Hospital Stay (HOSPITAL_COMMUNITY)
Admission: EM | Admit: 2023-12-10 | Discharge: 2023-12-17 | DRG: 194 | Disposition: A | Discharge status: Home Health | Attending: Family Medicine | Admitting: Family Medicine

## 2023-12-10 ENCOUNTER — Emergency Department (HOSPITAL_COMMUNITY)

## 2023-12-10 DIAGNOSIS — R5381 Other malaise: Secondary | ICD-10-CM | POA: Diagnosis not present

## 2023-12-10 DIAGNOSIS — Z881 Allergy status to other antibiotic agents status: Secondary | ICD-10-CM

## 2023-12-10 DIAGNOSIS — D631 Anemia in chronic kidney disease: Secondary | ICD-10-CM | POA: Diagnosis not present

## 2023-12-10 DIAGNOSIS — G4733 Obstructive sleep apnea (adult) (pediatric): Secondary | ICD-10-CM | POA: Diagnosis not present

## 2023-12-10 DIAGNOSIS — Z87891 Personal history of nicotine dependence: Secondary | ICD-10-CM

## 2023-12-10 DIAGNOSIS — R0689 Other abnormalities of breathing: Secondary | ICD-10-CM | POA: Diagnosis not present

## 2023-12-10 DIAGNOSIS — R000 Tachycardia, unspecified: Secondary | ICD-10-CM | POA: Diagnosis not present

## 2023-12-10 DIAGNOSIS — E872 Acidosis, unspecified: Secondary | ICD-10-CM | POA: Diagnosis present

## 2023-12-10 DIAGNOSIS — Z6834 Body mass index (BMI) 34.0-34.9, adult: Secondary | ICD-10-CM

## 2023-12-10 DIAGNOSIS — Z7982 Long term (current) use of aspirin: Secondary | ICD-10-CM

## 2023-12-10 DIAGNOSIS — N179 Acute kidney failure, unspecified: Secondary | ICD-10-CM | POA: Diagnosis not present

## 2023-12-10 DIAGNOSIS — R110 Nausea: Secondary | ICD-10-CM | POA: Diagnosis not present

## 2023-12-10 DIAGNOSIS — R339 Retention of urine, unspecified: Secondary | ICD-10-CM | POA: Diagnosis not present

## 2023-12-10 DIAGNOSIS — D472 Monoclonal gammopathy: Secondary | ICD-10-CM | POA: Diagnosis not present

## 2023-12-10 DIAGNOSIS — I130 Hypertensive heart and chronic kidney disease with heart failure and stage 1 through stage 4 chronic kidney disease, or unspecified chronic kidney disease: Secondary | ICD-10-CM | POA: Diagnosis not present

## 2023-12-10 DIAGNOSIS — K219 Gastro-esophageal reflux disease without esophagitis: Secondary | ICD-10-CM | POA: Diagnosis present

## 2023-12-10 DIAGNOSIS — E860 Dehydration: Secondary | ICD-10-CM | POA: Diagnosis present

## 2023-12-10 DIAGNOSIS — N1832 Chronic kidney disease, stage 3b: Secondary | ICD-10-CM | POA: Diagnosis not present

## 2023-12-10 DIAGNOSIS — Z825 Family history of asthma and other chronic lower respiratory diseases: Secondary | ICD-10-CM

## 2023-12-10 DIAGNOSIS — N178 Other acute kidney failure: Secondary | ICD-10-CM | POA: Diagnosis not present

## 2023-12-10 DIAGNOSIS — M109 Gout, unspecified: Secondary | ICD-10-CM | POA: Diagnosis not present

## 2023-12-10 DIAGNOSIS — Z89511 Acquired absence of right leg below knee: Secondary | ICD-10-CM | POA: Diagnosis not present

## 2023-12-10 DIAGNOSIS — I10 Essential (primary) hypertension: Secondary | ICD-10-CM | POA: Diagnosis present

## 2023-12-10 DIAGNOSIS — Z794 Long term (current) use of insulin: Secondary | ICD-10-CM

## 2023-12-10 DIAGNOSIS — E1151 Type 2 diabetes mellitus with diabetic peripheral angiopathy without gangrene: Secondary | ICD-10-CM | POA: Diagnosis present

## 2023-12-10 DIAGNOSIS — D849 Immunodeficiency, unspecified: Secondary | ICD-10-CM | POA: Diagnosis not present

## 2023-12-10 DIAGNOSIS — Z79890 Hormone replacement therapy (postmenopausal): Secondary | ICD-10-CM

## 2023-12-10 DIAGNOSIS — E8809 Other disorders of plasma-protein metabolism, not elsewhere classified: Secondary | ICD-10-CM | POA: Diagnosis not present

## 2023-12-10 DIAGNOSIS — R7401 Elevation of levels of liver transaminase levels: Secondary | ICD-10-CM | POA: Diagnosis not present

## 2023-12-10 DIAGNOSIS — Z993 Dependence on wheelchair: Secondary | ICD-10-CM

## 2023-12-10 DIAGNOSIS — J440 Chronic obstructive pulmonary disease with acute lower respiratory infection: Secondary | ICD-10-CM | POA: Diagnosis present

## 2023-12-10 DIAGNOSIS — E785 Hyperlipidemia, unspecified: Secondary | ICD-10-CM | POA: Diagnosis present

## 2023-12-10 DIAGNOSIS — Z89412 Acquired absence of left great toe: Secondary | ICD-10-CM

## 2023-12-10 DIAGNOSIS — R410 Disorientation, unspecified: Secondary | ICD-10-CM | POA: Diagnosis not present

## 2023-12-10 DIAGNOSIS — J189 Pneumonia, unspecified organism: Secondary | ICD-10-CM | POA: Diagnosis not present

## 2023-12-10 DIAGNOSIS — Z833 Family history of diabetes mellitus: Secondary | ICD-10-CM

## 2023-12-10 DIAGNOSIS — E1122 Type 2 diabetes mellitus with diabetic chronic kidney disease: Secondary | ICD-10-CM | POA: Diagnosis present

## 2023-12-10 DIAGNOSIS — N184 Chronic kidney disease, stage 4 (severe): Secondary | ICD-10-CM | POA: Diagnosis not present

## 2023-12-10 DIAGNOSIS — E039 Hypothyroidism, unspecified: Secondary | ICD-10-CM | POA: Diagnosis present

## 2023-12-10 DIAGNOSIS — Z89422 Acquired absence of other left toe(s): Secondary | ICD-10-CM

## 2023-12-10 DIAGNOSIS — Z79899 Other long term (current) drug therapy: Secondary | ICD-10-CM

## 2023-12-10 DIAGNOSIS — Z8673 Personal history of transient ischemic attack (TIA), and cerebral infarction without residual deficits: Secondary | ICD-10-CM

## 2023-12-10 DIAGNOSIS — R5383 Other fatigue: Secondary | ICD-10-CM | POA: Diagnosis present

## 2023-12-10 DIAGNOSIS — M7989 Other specified soft tissue disorders: Secondary | ICD-10-CM | POA: Insufficient documentation

## 2023-12-10 DIAGNOSIS — Z89519 Acquired absence of unspecified leg below knee: Secondary | ICD-10-CM

## 2023-12-10 DIAGNOSIS — Z7985 Long-term (current) use of injectable non-insulin antidiabetic drugs: Secondary | ICD-10-CM

## 2023-12-10 DIAGNOSIS — I959 Hypotension, unspecified: Secondary | ICD-10-CM | POA: Diagnosis not present

## 2023-12-10 DIAGNOSIS — I739 Peripheral vascular disease, unspecified: Secondary | ICD-10-CM | POA: Diagnosis present

## 2023-12-10 DIAGNOSIS — Z8249 Family history of ischemic heart disease and other diseases of the circulatory system: Secondary | ICD-10-CM

## 2023-12-10 DIAGNOSIS — Z1152 Encounter for screening for COVID-19: Secondary | ICD-10-CM

## 2023-12-10 DIAGNOSIS — I5032 Chronic diastolic (congestive) heart failure: Secondary | ICD-10-CM | POA: Diagnosis not present

## 2023-12-10 DIAGNOSIS — R531 Weakness: Secondary | ICD-10-CM | POA: Diagnosis not present

## 2023-12-10 MED ORDER — LACTATED RINGERS IV BOLUS (SEPSIS)
1000.0000 mL | Freq: Once | INTRAVENOUS | Status: AC
Start: 2023-12-10 — End: 2023-12-11
  Administered 2023-12-11: 1000 mL via INTRAVENOUS

## 2023-12-10 NOTE — ED Provider Notes (Signed)
 [facility] [place] EMERGENCY DEPARTMENT AT [facility] Provider Note   CSN: [id number] Arrival date & time: 12/10/23  2238     Patient presents with: Urinary Frequency   Micheal Walls is a 88 y.o. male.  {Add pertinent medical, surgical, social history, OB history to YEP:67052} Patient comes to the emergency department with generalized weakness and decreased urine output.  He reports that he frequently has to urinate but only a little bit comes out.  Symptoms present for a couple of days.       Prior to Admission medications   Medication Sig Start Date End Date Taking? Authorizing Provider  allopurinol  (ZYLOPRIM ) 100 MG tablet Take 100 mg by mouth 2 (two) times daily.    [provider]  aspirin  EC 81 MG tablet Take 81 mg by mouth daily. Swallow whole.    [provider]  Ferd Paint 1.5 % LIQD Apply between toes daily Patient not taking: Reported on 11/14/2023 09/22/22   Silva Juliene SAUNDERS, DPM  colchicine  0.6 MG tablet Take 0.6 mg by mouth once a week. 11/16/20   [provider]  Continuous Glucose Sensor (FREESTYLE LIBRE 14 DAY SENSOR) MISC Use as directed to check blood sugars 07/03/23   [name], [name]  diltiazem  (CARDIZEM  CD) 240 MG 24 hr capsule TAKE 1 CAPSULE BY MOUTH ONCE DAILY 10/27/23   [name], [name], MD  dorzolamide  (TRUSOPT ) 2 % ophthalmic solution Place 1 drop into both eyes 2 (two) times daily.    [provider]  ferrous sulfate  325 (65 FE) MG tablet Take 1 tablet (325 mg total) by mouth 2 (two) times daily with a meal. 12/04/21   [name], [name], MD  furosemide  (LASIX ) 40 MG tablet TAKE ONE (1) TABLET BY MOUTH TWICE DAILY *REFILL REQUEST* Patient taking differently: TAKE 2 TABS BY MOUTH IN THE MORNINGS AND 1 TAB BY MOUTH AT NIGHT 02/14/23   [name], [name]  gabapentin  (NEURONTIN ) 100 MG capsule TAKE TWO (2) CAPSULES BY MOUTH IN THE MORNING, TAKE 1 CAPSULE AT NOON AND 1 CAPSULE EVERY EVENING *REFILL REQUEST* 02/14/23    [name], [name]  glucose blood (FREESTYLE PRECISION NEO TEST) test strip Use as instructed 07/30/19   [name], [name]  latanoprost  (XALATAN ) 0.005 % ophthalmic solution Place 1 drop into both eyes at bedtime.  10/12/18   [provider]  Multiple Vitamins-Minerals (MULTIVITAMIN WITH MINERALS) tablet Take 1 tablet by mouth daily.    [provider]  Multiple Vitamins-Minerals (PRESERVISION AREDS) CAPS Take 2 capsules by mouth daily.    [provider]  pantoprazole  (PROTONIX ) 40 MG tablet TAKE 1 TABLET BY MOUTH EVERY DAY AT BEDTIME *REFILL REQUEST* 02/14/23   [name], [name]  rosuvastatin  (CRESTOR ) 20 MG tablet TAKE 1 TABLET (20MG  TOTAL) BY MOUTH AT BEDTIME 10/30/23   [name], [name], MD  Semaglutide , 2 MG/DOSE, (OZEMPIC , 2 MG/DOSE,) 8 MG/3ML SOPN Inject 2 mg into the skin once a week. 07/03/23   [name], [name]  senna-docusate (SENOKOT-S) 8.6-50 MG tablet Take 1 tablet by mouth 2 (two) times daily between meals as needed for mild constipation or moderate constipation. Patient taking differently: Take 1 tablet by mouth 2 (two) times daily as needed (constipation). 10/24/20   [name], [name], MD  SYNTHROID  25 MCG tablet Take 1 tablet by mouth Monday - Friday 10/24/23   Moore, Janece, [name]  tamsulosin  (FLOMAX ) 0.4 MG CAPS capsule Take 0.4 mg by mouth daily.    [provider]    Allergies: Vancomycin   Review of Systems  Updated Vital Signs BP (!) 141/117   Pulse (!) 101   Temp 98.8 F (37.1 C) (Oral)   Resp 20   SpO2 100%   Physical Exam Vitals and nursing note reviewed.  Constitutional:      General: He is not in acute distress.    Appearance: He is well-developed.  HENT:     Head: Normocephalic and atraumatic.     Mouth/Throat:     Mouth: Mucous membranes are moist.   Eyes:     General: Vision grossly intact. Gaze aligned appropriately.     Extraocular Movements: Extraocular movements intact.     Conjunctiva/sclera: Conjunctivae normal.     Cardiovascular:     Rate and Rhythm: Regular rhythm. Tachycardia present.     Pulses: Normal pulses.     Heart sounds: Normal heart sounds, S1 normal and S2 normal. No murmur heard.    No friction rub. No gallop.  Pulmonary:     Effort: Pulmonary effort is normal. No respiratory distress.     Breath sounds: Normal breath sounds.  Abdominal:     Palpations: Abdomen is soft.     Tenderness: There is no abdominal tenderness. There is no guarding or rebound.     Hernia: No hernia is present.   Musculoskeletal:        General: No swelling.     Cervical back: Full passive range of motion without pain, normal range of motion and neck supple. No pain with movement, spinous process tenderness or muscular tenderness. Normal range of motion.     Right lower leg: No edema.     Left lower leg: No edema.   Skin:    General: Skin is warm and dry.     Capillary Refill: Capillary refill takes less than 2 seconds.     Findings: No ecchymosis, erythema, lesion or wound.   Neurological:     Mental Status: He is alert and oriented to person, place, and time.     GCS: GCS eye subscore is 4. GCS verbal subscore is 5. GCS motor subscore is 6.     Cranial Nerves: Cranial nerves 2-12 are intact.     Sensory: Sensation is intact.     Motor: Motor function is intact. No weakness or abnormal muscle tone.     Coordination: Coordination is intact.   Psychiatric:        Mood and Affect: Mood normal.        Speech: Speech normal.        Behavior: Behavior normal.     (all labs ordered are listed, but only abnormal results are displayed) Labs Reviewed  BASIC METABOLIC PANEL WITH GFR  CBC  URINALYSIS, ROUTINE W REFLEX MICROSCOPIC  TROPONIN I (HIGH SENSITIVITY)    EKG: None  Radiology: DG Chest 2 View Result Date: 12/10/2023 CLINICAL DATA:  Fall weakness and urinary frequency, unable to get out of bed x2 days. EXAM: CHEST - 2 VIEW COMPARISON:  Chest AP Lat 10/21/2020 FINDINGS: Low inspiration  on exam with elevated right hemidiaphragm. There is a patchy airspace infiltrate in the right upper lobe consistent with pneumonia in this clinical setting. Rest of the hypoinflated lungs are generally clear apart from linear atelectasis over the elevated right diaphragm. No pleural effusion is seen.  The cardiac size is normal. The mediastinum is normally outlined, with aortic atherosclerosis. Moderate thoracic spondylosis. IMPRESSION: 1. Patchy airspace infiltrate in the right upper lobe consistent with pneumonia in this clinical setting. Follow-up  chest x-ray recommended to ensure resolution. 2. Low inspiration with elevated right hemidiaphragm. 3. Aortic atherosclerosis. Electronically Signed   By: Francis Quam M.D.   On: 12/10/2023 23:24    {Document cardiac monitor, telemetry assessment procedure when appropriate:32947} Procedures   Medications Ordered in the ED - No data to display    {Click here for ABCD2, HEART and other calculators REFRESH Note before signing:1}                              Medical Decision Making  ***  {Document critical care time when appropriate  Document review of labs and clinical decision tools ie [id number], etc  Document your independent review of radiology images and any outside records  Document your discussion with family members, caretakers and with consultants  Document social determinants of health affecting pt's care  Document your decision making why or why not admission, treatments were needed:32947:::1}   Final diagnoses:  None    ED Discharge Orders     None

## 2023-12-10 NOTE — ED Triage Notes (Signed)
 Pt here from home with c/o weakness and laying in bed since Friday also having  some urinary frequency

## 2023-12-11 ENCOUNTER — Other Ambulatory Visit: Payer: Self-pay

## 2023-12-11 ENCOUNTER — Encounter (HOSPITAL_COMMUNITY): Payer: Self-pay | Admitting: Family Medicine

## 2023-12-11 DIAGNOSIS — Z89511 Acquired absence of right leg below knee: Secondary | ICD-10-CM | POA: Diagnosis not present

## 2023-12-11 DIAGNOSIS — R7401 Elevation of levels of liver transaminase levels: Secondary | ICD-10-CM | POA: Diagnosis present

## 2023-12-11 DIAGNOSIS — K219 Gastro-esophageal reflux disease without esophagitis: Secondary | ICD-10-CM | POA: Diagnosis present

## 2023-12-11 DIAGNOSIS — I5032 Chronic diastolic (congestive) heart failure: Secondary | ICD-10-CM

## 2023-12-11 DIAGNOSIS — R5381 Other malaise: Secondary | ICD-10-CM | POA: Diagnosis present

## 2023-12-11 DIAGNOSIS — J440 Chronic obstructive pulmonary disease with acute lower respiratory infection: Secondary | ICD-10-CM | POA: Diagnosis present

## 2023-12-11 DIAGNOSIS — G4733 Obstructive sleep apnea (adult) (pediatric): Secondary | ICD-10-CM | POA: Diagnosis present

## 2023-12-11 DIAGNOSIS — E860 Dehydration: Secondary | ICD-10-CM | POA: Diagnosis present

## 2023-12-11 DIAGNOSIS — I739 Peripheral vascular disease, unspecified: Secondary | ICD-10-CM

## 2023-12-11 DIAGNOSIS — M109 Gout, unspecified: Secondary | ICD-10-CM | POA: Diagnosis present

## 2023-12-11 DIAGNOSIS — D472 Monoclonal gammopathy: Secondary | ICD-10-CM

## 2023-12-11 DIAGNOSIS — I130 Hypertensive heart and chronic kidney disease with heart failure and stage 1 through stage 4 chronic kidney disease, or unspecified chronic kidney disease: Secondary | ICD-10-CM | POA: Diagnosis present

## 2023-12-11 DIAGNOSIS — I5030 Unspecified diastolic (congestive) heart failure: Secondary | ICD-10-CM

## 2023-12-11 DIAGNOSIS — N184 Chronic kidney disease, stage 4 (severe): Secondary | ICD-10-CM | POA: Diagnosis present

## 2023-12-11 DIAGNOSIS — D849 Immunodeficiency, unspecified: Secondary | ICD-10-CM | POA: Diagnosis present

## 2023-12-11 DIAGNOSIS — N178 Other acute kidney failure: Secondary | ICD-10-CM | POA: Diagnosis not present

## 2023-12-11 DIAGNOSIS — E872 Acidosis, unspecified: Secondary | ICD-10-CM | POA: Diagnosis present

## 2023-12-11 DIAGNOSIS — E8809 Other disorders of plasma-protein metabolism, not elsewhere classified: Secondary | ICD-10-CM | POA: Diagnosis present

## 2023-12-11 DIAGNOSIS — E039 Hypothyroidism, unspecified: Secondary | ICD-10-CM | POA: Diagnosis present

## 2023-12-11 DIAGNOSIS — Z794 Long term (current) use of insulin: Secondary | ICD-10-CM

## 2023-12-11 DIAGNOSIS — N1832 Chronic kidney disease, stage 3b: Secondary | ICD-10-CM | POA: Diagnosis not present

## 2023-12-11 DIAGNOSIS — E1151 Type 2 diabetes mellitus with diabetic peripheral angiopathy without gangrene: Secondary | ICD-10-CM | POA: Diagnosis present

## 2023-12-11 DIAGNOSIS — R5383 Other fatigue: Secondary | ICD-10-CM | POA: Diagnosis present

## 2023-12-11 DIAGNOSIS — N189 Chronic kidney disease, unspecified: Secondary | ICD-10-CM

## 2023-12-11 DIAGNOSIS — E1122 Type 2 diabetes mellitus with diabetic chronic kidney disease: Secondary | ICD-10-CM

## 2023-12-11 DIAGNOSIS — Z8673 Personal history of transient ischemic attack (TIA), and cerebral infarction without residual deficits: Secondary | ICD-10-CM

## 2023-12-11 DIAGNOSIS — N179 Acute kidney failure, unspecified: Secondary | ICD-10-CM | POA: Diagnosis present

## 2023-12-11 DIAGNOSIS — J189 Pneumonia, unspecified organism: Secondary | ICD-10-CM | POA: Diagnosis present

## 2023-12-11 DIAGNOSIS — Z1152 Encounter for screening for COVID-19: Secondary | ICD-10-CM | POA: Diagnosis not present

## 2023-12-11 DIAGNOSIS — E1169 Type 2 diabetes mellitus with other specified complication: Secondary | ICD-10-CM

## 2023-12-11 DIAGNOSIS — D631 Anemia in chronic kidney disease: Secondary | ICD-10-CM | POA: Diagnosis present

## 2023-12-11 LAB — CBC
HCT: 34.3 % — ABNORMAL LOW (ref 39.0–52.0)
HCT: 33.5 % — ABNORMAL LOW (ref 39.0–52.0)
Hemoglobin: 11.2 g/dL — ABNORMAL LOW (ref 13.0–17.0)
Hemoglobin: 10.9 g/dL — ABNORMAL LOW (ref 13.0–17.0)
MCH: 28.1 pg (ref 26.0–34.0)
MCH: 28.1 pg (ref 26.0–34.0)
MCHC: 32.7 g/dL (ref 30.0–36.0)
MCHC: 32.5 g/dL (ref 30.0–36.0)
MCV: 86.2 fL (ref 80.0–100.0)
MCV: 86.3 fL (ref 80.0–100.0)
Platelets: 324 10*3/uL (ref 150–400)
Platelets: 317 10*3/uL (ref 150–400)
RBC: 3.98 MIL/uL — ABNORMAL LOW (ref 4.22–5.81)
RBC: 3.88 MIL/uL — ABNORMAL LOW (ref 4.22–5.81)
RDW: 15.6 % — ABNORMAL HIGH (ref 11.5–15.5)
RDW: 15.8 % — ABNORMAL HIGH (ref 11.5–15.5)
WBC: 9.8 10*3/uL (ref 4.0–10.5)
WBC: 9.3 10*3/uL (ref 4.0–10.5)
nRBC: 0 % (ref 0.0–0.2)
nRBC: 0 % (ref 0.0–0.2)

## 2023-12-11 LAB — COMPREHENSIVE METABOLIC PANEL WITH GFR
ALT: 198 U/L — ABNORMAL HIGH (ref 0–44)
ALT: 182 U/L — ABNORMAL HIGH (ref 0–44)
ALT: 177 U/L — ABNORMAL HIGH (ref 0–44)
AST: 141 U/L — ABNORMAL HIGH (ref 15–41)
AST: 136 U/L — ABNORMAL HIGH (ref 15–41)
AST: 118 U/L — ABNORMAL HIGH (ref 15–41)
Albumin: 2.6 g/dL — ABNORMAL LOW (ref 3.5–5.0)
Albumin: 2.3 g/dL — ABNORMAL LOW (ref 3.5–5.0)
Albumin: 2.3 g/dL — ABNORMAL LOW (ref 3.5–5.0)
Alkaline Phosphatase: 100 U/L (ref 38–126)
Alkaline Phosphatase: 93 U/L (ref 38–126)
Alkaline Phosphatase: 91 U/L (ref 38–126)
Anion gap: 15 (ref 5–15)
Anion gap: 11 (ref 5–15)
Anion gap: 13 (ref 5–15)
BUN: 75 mg/dL — ABNORMAL HIGH (ref 8–23)
BUN: 66 mg/dL — ABNORMAL HIGH (ref 8–23)
BUN: 61 mg/dL — ABNORMAL HIGH (ref 8–23)
CO2: 20 mmol/L — ABNORMAL LOW (ref 22–32)
CO2: 24 mmol/L (ref 22–32)
CO2: 22 mmol/L (ref 22–32)
Calcium: 10 mg/dL (ref 8.9–10.3)
Calcium: 9.7 mg/dL (ref 8.9–10.3)
Calcium: 9.6 mg/dL (ref 8.9–10.3)
Chloride: 101 mmol/L (ref 98–111)
Chloride: 101 mmol/L (ref 98–111)
Chloride: 102 mmol/L (ref 98–111)
Creatinine, Ser: 2.78 mg/dL — ABNORMAL HIGH (ref 0.61–1.24)
Creatinine, Ser: 2.41 mg/dL — ABNORMAL HIGH (ref 0.61–1.24)
Creatinine, Ser: 2.47 mg/dL — ABNORMAL HIGH (ref 0.61–1.24)
GFR, Estimated: 21 mL/min — ABNORMAL LOW (ref >60)
GFR, Estimated: 26 mL/min — ABNORMAL LOW (ref >60)
GFR, Estimated: 25 mL/min — ABNORMAL LOW (ref >60)
Glucose, Bld: 175 mg/dL — ABNORMAL HIGH (ref 70–99)
Glucose, Bld: 160 mg/dL — ABNORMAL HIGH (ref 70–99)
Glucose, Bld: 164 mg/dL — ABNORMAL HIGH (ref 70–99)
Potassium: 3.8 mmol/L (ref 3.5–5.1)
Potassium: 3.6 mmol/L (ref 3.5–5.1)
Potassium: 4.1 mmol/L (ref 3.5–5.1)
Sodium: 136 mmol/L (ref 135–145)
Sodium: 136 mmol/L (ref 135–145)
Sodium: 137 mmol/L (ref 135–145)
Total Bilirubin: 0.9 mg/dL (ref 0.0–1.2)
Total Bilirubin: 1 mg/dL (ref 0.0–1.2)
Total Bilirubin: 0.8 mg/dL (ref 0.0–1.2)
Total Protein: 7.4 g/dL (ref 6.5–8.1)
Total Protein: 6.9 g/dL (ref 6.5–8.1)
Total Protein: 7.1 g/dL (ref 6.5–8.1)

## 2023-12-11 LAB — URINALYSIS, W/ REFLEX TO CULTURE (INFECTION SUSPECTED)
Bilirubin Urine: NEGATIVE
Glucose, UA: NEGATIVE mg/dL
Hgb urine dipstick: NEGATIVE
Ketones, ur: NEGATIVE mg/dL
Leukocytes,Ua: NEGATIVE
Nitrite: NEGATIVE
Protein, ur: 100 mg/dL — AB
Specific Gravity, Urine: 1.01 (ref 1.005–1.030)
pH: 6 (ref 5.0–8.0)

## 2023-12-11 LAB — TROPONIN I (HIGH SENSITIVITY)
Troponin I (High Sensitivity): 62 ng/L — ABNORMAL HIGH (ref <18)
Troponin I (High Sensitivity): 52 ng/L — ABNORMAL HIGH (ref <18)

## 2023-12-11 LAB — HEPATITIS PANEL, ACUTE
HCV Ab: NONREACTIVE
Hep A IgM: NONREACTIVE
Hep B C IgM: NONREACTIVE
Hepatitis B Surface Ag: NONREACTIVE

## 2023-12-11 LAB — CBG MONITORING, ED: Glucose-Capillary: 163 mg/dL — ABNORMAL HIGH (ref 70–99)

## 2023-12-11 LAB — PROTIME-INR
INR: 1.3 — ABNORMAL HIGH (ref 0.8–1.2)
Prothrombin Time: 16.9 s — ABNORMAL HIGH (ref 11.4–15.2)

## 2023-12-11 LAB — I-STAT CG4 LACTIC ACID, ED: Lactic Acid, Venous: 1.2 mmol/L (ref 0.5–1.9)

## 2023-12-11 LAB — RESP PANEL BY RT-PCR (RSV, FLU A&B, COVID)  RVPGX2
Influenza A by PCR: NEGATIVE
Influenza B by PCR: NEGATIVE
Resp Syncytial Virus by PCR: NEGATIVE
SARS Coronavirus 2 by RT PCR: NEGATIVE

## 2023-12-11 LAB — CK: Total CK: 482 U/L — ABNORMAL HIGH (ref 49–397)

## 2023-12-11 MED ORDER — SODIUM CHLORIDE 0.9 % IV SOLN
2.0000 g | INTRAVENOUS | Status: AC
  Administered 2023-12-11 – 2023-12-14 (×4): 2 g via INTRAVENOUS
  Filled 2023-12-11 (×4): qty 20

## 2023-12-11 MED ORDER — SODIUM CHLORIDE 0.9 % IV SOLN
1.0000 g | Freq: Once | INTRAVENOUS | Status: AC
  Administered 2023-12-11: 1 g via INTRAVENOUS
  Filled 2023-12-11: qty 10

## 2023-12-11 MED ORDER — SODIUM CHLORIDE 0.9 % IV SOLN
500.0000 mg | Freq: Once | INTRAVENOUS | Status: AC
  Administered 2023-12-11: 500 mg via INTRAVENOUS
  Filled 2023-12-11: qty 5

## 2023-12-11 MED ORDER — INSULIN ASPART 100 UNIT/ML IJ SOLN: 0.0000 [IU] | Freq: Every day | INTRAMUSCULAR | Status: DC

## 2023-12-11 MED ORDER — ACETAMINOPHEN 650 MG RE SUPP
650.0000 mg | Freq: Four times a day (QID) | RECTAL | Status: DC | PRN
  Filled 2023-12-11 (×4): qty 1

## 2023-12-11 MED ORDER — LEVALBUTEROL HCL 0.63 MG/3ML IN NEBU
0.6300 mg | INHALATION_SOLUTION | Freq: Four times a day (QID) | RESPIRATORY_TRACT | Status: DC | PRN
  Administered 2023-12-13: 0.63 mg via RESPIRATORY_TRACT
  Filled 2023-12-11 (×5): qty 3

## 2023-12-11 MED ORDER — LEVOTHYROXINE SODIUM 25 MCG PO TABS
25.0000 ug | ORAL_TABLET | ORAL | Status: DC
  Administered 2023-12-11 – 2023-12-15 (×5): 25 ug via ORAL
  Filled 2023-12-11 (×6): qty 1

## 2023-12-11 MED ORDER — INSULIN ASPART 100 UNIT/ML IJ SOLN
0.0000 [IU] | Freq: Three times a day (TID) | INTRAMUSCULAR | Status: DC
  Administered 2023-12-11: 1 [IU] via SUBCUTANEOUS

## 2023-12-11 MED ORDER — HEPARIN SODIUM (PORCINE) 5000 UNIT/ML IJ SOLN
5000.0000 [IU] | Freq: Three times a day (TID) | INTRAMUSCULAR | Status: DC
  Administered 2023-12-11: 5000 [IU] via SUBCUTANEOUS
  Filled 2023-12-11: qty 1

## 2023-12-11 MED ORDER — PROCHLORPERAZINE EDISYLATE 10 MG/2ML IJ SOLN: 5.0000 mg | Freq: Four times a day (QID) | INTRAMUSCULAR | Status: DC | PRN

## 2023-12-11 MED ORDER — ONDANSETRON HCL 4 MG PO TABS
4.0000 mg | ORAL_TABLET | Freq: Four times a day (QID) | ORAL | Status: DC | PRN
  Filled 2023-12-11 (×4): qty 1

## 2023-12-11 MED ORDER — SODIUM CHLORIDE 0.9% FLUSH: 3.0000 mL | Freq: Two times a day (BID) | INTRAVENOUS | Status: DC

## 2023-12-11 MED ORDER — SODIUM CHLORIDE 0.9 % IV BOLUS
250.0000 mL | Freq: Once | INTRAVENOUS | Status: AC
  Administered 2023-12-11: 250 mL via INTRAVENOUS

## 2023-12-11 MED ORDER — DILTIAZEM HCL ER COATED BEADS 240 MG PO CP24
240.0000 mg | ORAL_CAPSULE | Freq: Every day | ORAL | Status: DC
  Administered 2023-12-11 – 2023-12-13 (×3): 240 mg via ORAL
  Filled 2023-12-11 (×2): qty 1
  Filled 2023-12-11: qty 2
  Filled 2023-12-11 (×2): qty 1

## 2023-12-11 MED ORDER — TAMSULOSIN HCL 0.4 MG PO CAPS
0.4000 mg | ORAL_CAPSULE | Freq: Every day | ORAL | Status: DC
  Administered 2023-12-11 – 2023-12-17 (×7): 0.4 mg via ORAL
  Filled 2023-12-11 (×8): qty 1

## 2023-12-11 MED ORDER — LACTATED RINGERS IV SOLN
INTRAVENOUS | Status: AC
  Administered 2023-12-11: 1000 mL via INTRAVENOUS

## 2023-12-11 MED ORDER — FUROSEMIDE 20 MG PO TABS
40.0000 mg | ORAL_TABLET | Freq: Every day | ORAL | Status: DC
  Administered 2023-12-12: 40 mg via ORAL
  Filled 2023-12-11 (×2): qty 2

## 2023-12-11 MED ORDER — ACETAMINOPHEN 325 MG PO TABS: 325.0000 mg | ORAL_TABLET | Freq: Four times a day (QID) | ORAL | Status: DC | PRN

## 2023-12-11 MED ORDER — LACTATED RINGERS IV SOLN: INTRAVENOUS | Status: AC

## 2023-12-11 MED ORDER — DORZOLAMIDE HCL 2 % OP SOLN
1.0000 [drp] | Freq: Two times a day (BID) | OPHTHALMIC | Status: DC
  Administered 2023-12-11 – 2023-12-17 (×12): 1 [drp] via OPHTHALMIC
  Filled 2023-12-11: qty 10

## 2023-12-11 MED ORDER — ACETAMINOPHEN 325 MG PO TABS
650.0000 mg | ORAL_TABLET | Freq: Four times a day (QID) | ORAL | Status: DC | PRN
Start: 2023-12-11 — End: 2023-12-17
  Filled 2023-12-11 (×4): qty 2

## 2023-12-11 MED ORDER — LATANOPROST 0.005 % OP SOLN
1.0000 [drp] | Freq: Every day | OPHTHALMIC | Status: DC
  Administered 2023-12-11 – 2023-12-16 (×6): 1 [drp] via OPHTHALMIC
  Filled 2023-12-11: qty 2.5

## 2023-12-11 MED ORDER — HEPARIN SODIUM (PORCINE) 5000 UNIT/ML IJ SOLN
5000.0000 [IU] | Freq: Two times a day (BID) | INTRAMUSCULAR | Status: DC
  Administered 2023-12-11: 5000 [IU] via SUBCUTANEOUS
  Filled 2023-12-11 (×4): qty 1

## 2023-12-11 MED ORDER — LACTATED RINGERS IV SOLN: INTRAVENOUS | Status: DC

## 2023-12-11 MED ORDER — ASPIRIN 81 MG PO TBEC
81.0000 mg | DELAYED_RELEASE_TABLET | Freq: Every day | ORAL | Status: DC
  Administered 2023-12-11 – 2023-12-17 (×7): 81 mg via ORAL
  Filled 2023-12-11 (×8): qty 1

## 2023-12-11 MED ORDER — FUROSEMIDE 40 MG PO TABS
40.0000 mg | ORAL_TABLET | ORAL | Status: AC
  Filled 2023-12-11: qty 1

## 2023-12-11 MED ORDER — AZITHROMYCIN 500 MG PO TABS
500.0000 mg | ORAL_TABLET | Freq: Every day | ORAL | Status: AC
  Administered 2023-12-11 – 2023-12-14 (×4): 500 mg via ORAL
  Filled 2023-12-11: qty 1
  Filled 2023-12-11: qty 2
  Filled 2023-12-11 (×2): qty 1

## 2023-12-11 NOTE — Progress Notes (Signed)
 Physical Therapy Quick Note  PT has completed initial evaluation.    Overall, patient at supervision assistance level.   PT Follow up recommended: No follow up PT; pt reporting he is mobilizing near his baseline.  Equipment recommended:  None recommended Complete evaluation note to follow.     Leontine Hilt, [facility] [phone]

## 2023-12-11 NOTE — Progress Notes (Signed)
 Hospital at Home Progress Note   Patient: Micheal Walls FMW:[id number] DOB: 1935/07/06 DOA: 12/10/2023     0 DOS: the patient was seen and examined on 12/11/2023   Brief hospital course: Micheal Walls is an 88 y.o. male with medical history significant for hypertension, type 2 diabetes mellitus, OSA, PAD, CVA, hypothyroidism, chronic HFpEF, and MGUS who presents with fatigue, malaise, loss of appetite, and cough.   Patient began to feel generally poor on 12/08/2023.  He has been mainly staying in bed since then with general malaise and cough.  He has not felt particularly short of breath but has not been very active at all.  He also reports a loss of appetite but denies abdominal pain or vomiting.  He reports that his urine volume seems to have decreased over the past day or 2 but denies dysuria.   ED Course: Upon arrival to the ED, patient is found to be afebrile and saturating mid 90s on room air with tachypnea, mild tachycardia, and stable blood pressure.  Labs are most notable for creatinine 2.78, albumin 2.6, AST 141, ALT 198, normal WBC, and normal lactic acid.  Chest x-ray is concerning for right upper lobe pneumonia.   Blood cultures were collected and the patient was given a liter of LR, Rocephin , and azithromycin .  Subjective: At present, pt states that he weakness and lethargy has somewhat improved. Pt is currently denying and SOB or chest pain. + mild cough. . Denies any abdominal pain.  From a functionality standpoint, pt states that he is in a mobility wheelchair, but is able to ambulate to rolling walker to use bathroom as well as bathe. Performs all of ADLs apart from food to which meals on wheels delivers. Lives with his wife who has mild dementia. Also has a nephew living with him who works at [employer] pending finding housing of his own.  Extensive conversation had with patient as well as daughter about placement into the hospital at home program at the bedside as well as on the  phone with the daughter. The patient and family are in agreement. Screening performed at the bedside. Consent obtained.     Assessment and Plan: 1. Pneumonia  -Noted weakness, fatigue and cough on initial presentation with noted RUL pneumonia on imaging 12/10/22  -no active hypoxia at present  -PSI score 4  -Started on IV rocephin  and azithromycin  in ER overnight; currently day 2  -Panculture  -Incentive spirometry  - Check strep pneumo and legionella antigens, continue empiric antibiotics and supportive care, follow cultures and clinical course     2. AKI superimposed on CKD IV  -Baseline Cr 2.1-2.3  -Cr 2.78 on presentation w/ GFR in the low 20s  -Cr 2.4 today and improving w/ IVF  - Likely prerenal in setting of decreased po intake  - Hold Lasix , continue gentle IVF hydration, renally-dose medications, repeat chem panel     3. Elevated transaminases  - AST is 141 and ALT 198 with normal bilirubin; transaminases previously normal  -T bili WNL  -trending down to 136/182 as of 12/11/2023 - No RUQ pain  - Check viral hepatitis panel, hold Crestor  for now, repeat CMP in am    4. Chronic HFpEF  - Hold diuretic with hydrating with IVF, monitor volume status  -current weight 123 kg  -trend weight closely with hydration  -monitor closely -likely resume lasix  in am     5. Type II DM  -Blood sugars in 160s -trend blood sugars  -  A1C  -Monitor       6. MGUS  - Appears stable    7. OSA  - CPAP while sleeping    8. Hypothyroidism  - Synthroid     9. Hx of CVA; PAD  - Continue ASA, hold statin while trending LFTs          Physical Exam: Vitals:   12/11/23 0830 12/11/23 0900 12/11/23 1000 12/11/23 1020  BP: (!) 163/76  (!) 193/82 (!) 180/90  Pulse: (!) 106 (!) 107 (!) 105 (!) 104  Resp: (!) 30  18 18   Temp:    98.4 F (36.9 C)  TempSrc:    Oral  SpO2: 98% 98% 98% 98%  Weight:      Height:       Physical Exam Constitutional:      Appearance: He is obese.   HENT:     Head: Normocephalic.     Nose: Nose normal.     Mouth/Throat:     Mouth: Mucous membranes are moist.   Eyes:     Pupils: Pupils are equal, round, and reactive to light.    Cardiovascular:     Rate and Rhythm: Normal rate and regular rhythm.  Pulmonary:     Effort: Pulmonary effort is normal.  Abdominal:     General: Abdomen is flat.   Musculoskeletal:     Comments: R BKA     Neurological:     General: No focal deficit present.   Psychiatric:        Mood and Affect: Mood normal.    Data Reviewed:  There are no new results to review at this time. DG Chest 2 View CLINICAL DATA:  Fall weakness and urinary frequency, unable to get out of bed x2 days.  EXAM: CHEST - 2 VIEW  COMPARISON:  Chest AP Lat 10/21/2020  FINDINGS: Low inspiration on exam with elevated right hemidiaphragm. There is a patchy airspace infiltrate in the right upper lobe consistent with pneumonia in this clinical setting.  Rest of the hypoinflated lungs are generally clear apart from linear atelectasis over the elevated right diaphragm.  No pleural effusion is seen.  The cardiac size is normal.  The mediastinum is normally outlined, with aortic atherosclerosis. Moderate thoracic spondylosis.  IMPRESSION: 1. Patchy airspace infiltrate in the right upper lobe consistent with pneumonia in this clinical setting. Follow-up chest x-ray recommended to ensure resolution. 2. Low inspiration with elevated right hemidiaphragm. 3. Aortic atherosclerosis.  Electronically Signed   By: Francis Quam M.D.   On: 12/10/2023 23:24  Lab Results  Component Value Date   WBC 9.3 12/11/2023   HGB 10.9 (L) 12/11/2023   HCT 33.5 (L) 12/11/2023   MCV 86.3 12/11/2023   PLT 317 12/11/2023   Last metabolic panel Lab Results  Component Value Date   GLUCOSE 160 (H) 12/11/2023   NA 136 12/11/2023   K 3.6 12/11/2023   CL 101 12/11/2023   CO2 24 12/11/2023   BUN 66 (H) 12/11/2023   CREATININE  2.41 (H) 12/11/2023   GFRNONAA 26 (L) 12/11/2023   CALCIUM  9.7 12/11/2023   PHOS 3.4 02/07/2023   PROT 6.9 12/11/2023   ALBUMIN 2.3 (L) 12/11/2023   LABGLOB 3.5 11/07/2023   AGRATIO 1.3 03/21/2022   BILITOT 1.0 12/11/2023   ALKPHOS 93 12/11/2023   AST 136 (H) 12/11/2023   ALT 182 (H) 12/11/2023   ANIONGAP 11 12/11/2023    Family Communication: Plan of care discussed with patient as well as daughter over  the phone.    Disposition: Status is: Inpatient Remains inpatient appropriate because: PSI score 4, need for IV antibiotic treatment and need for IVF hydration in setting of acute on chronic kidney disease   Planned Discharge Destination: Home    Time spent: >60 minutes  Hospital at Home Admission Criteria Checklist:  Formal consent explained in detail and signed at the bedside:Prentiss s Patient meets inpatient admission criteria (see below for further details)  Is pt medicare FFS( required for initial launch with plan to expand)? Yes  Lives within 25 mil/ 30 min from [facility] within [place](pt may stay with family member during admission who lives within 25 miles or 30 min from [employer] w/in [facility]) ? Yes   Hemodynamically stable with relatively low risk of clinical deterioration-not requiring ICU? Yes  Age >55?Yes  Does not require frequent touch-points or complex interventions/medications (ie Titrated Infusions (IV insulin , heparin  drips, vasoactive drips, use of infused or injectable controlled substances, patients on insulin )?  Any Behavioral Health comorbidities likely to increase risk for in-home care (ie Acute delirium or experiencing a marked altered mental status and cause is not a treatable condition in the home)? No   No active safety concerns (ie Unable to use bedside commode independently and lacks caregiver support for safety- needs SNF placement, unable to obtain IV access)?  Common admission diagnoses including: CAP, COPD Exacerbation, Acute on chronic heart  failure, Cellulitis, UTI , dehydration, acute resp failure with hypoxia (requiring <5L)   Social Screening: Denies significant ETOH intake? Yes Does not smoke and understands may not smoke in the presence of oxygen ? Yes   Patient states able to use iPad/phone for communication/has family who is able to use? Yes  Any active drug use in patient or primary caregiver including daily dosing of methadone?  No Stable home environment ( access to appropriate heating in cold conditions and/or appropriate air conditioning in hot conditions and/or no running water/electricity)? Yes  No aggressive pets at home? No  Firearm present  with ability or willingness to store them unloaded in a locked case for duration of hospitalization? None reported   Ambulatory? Wheelchair bound, R leg prosthesis in place. Able to ambulate to bathroom with rolling walker  (independently cane, walker  wheelchair bound, bed bound) Bed bugs present on home evaluation?No   Needs oxygen  at home (<6L)? No  Family support system in place? Yes  Patient feels safe at home does not endorse any violence? Ues Any actively decompensated behavioral healthy issues including agitation/aggressive behavior? No    Patient requests food to be provided by hospital home program? PT/OT eval completed and not requiring SNF, ALF, inpatient rehab? Yes    To be admitted to the Hospital at [facility] program, a patient generally must meet the following: 1. Requirement for Inpatient Level of Care: The patient's condition must necessitate an inpatient level of care. This is typically indicated by one or more of the following, depending on their specific diagnosis: -Persistent tachycardia despite appropriate treatment (e.g., for Heart Failure, UTI). - Persistent tachypnea (rapid breathing) or dyspnea (shortness of breath) that hasn't improved sufficiently with observation care (e.g., for Heart Failure, Pneumonia, Viral Illness, COVID). - Hypoxemia (low  oxygen  levels), such as a new need for oxygen , an increased need from baseline, or specific oxygen  saturation levels (e.g., SpO2 <90-94% depending on the condition) that persist despite observation (e.g., for Heart Failure, COPD, Pneumonia, Viral Illness, COVID). - Need for Intravenous (IV) hydration due to an inability to maintain oral  hydration, which persists despite observation care (e.g., for Cellulitis, UTI, Viral Illness, COVID). - Specific to Heart Failure: Persistent pulmonary edema, indicated by a new oxygen  need, lack of improvement with IV diuretics, and ongoing tachypnea/dyspnea. - Specific to COPD: A decrease in known baseline resting oxygen  saturation (SpO2) by 4% or more, or an increase in pre-existing supplemental oxygen  requirements, which persists despite observation and requires continued close monitoring. - Specific to Pneumonia: A Pneumonia Severity Index (PSI) class IV (moderate risk). - Specific to Cellulitis: Failure of outpatient antibiotic therapy (indicated by progression or no improvement after a minimum of 48 hours on an adequate regimen) or a clinical presentation (like acuity or rapidity of progression) that requires the intensity of monitoring found in an inpatient setting. - Specific to UTI: Persistence or worsening of clinical findings like fever, pain, or dehydration despite observation care; presence of significant uropathy; suspected infection of an indwelling prosthetic device, stent, implant, or graft; or pregnancy with suspected pyelonephritis. 2. Appropriateness for Hospital at Home Setting: The patient's overall clinical picture, including the severity of their illness, their care needs, and their medical history and comorbidities, must be suitable for management in the Hospital at Home environment. This essentially means that none of the exclusion criteria (listed below) are met.  Unified Exclusion Criteria for Hospital at Home Admission: A  patient would not be eligible for Hospital at Home if any of the following are present: - Hemodynamic Instability: - Hypotension (low blood pressure) is present. - Respiratory Instability or Needs Beyond Program Capability: - There is a new need for invasive or noninvasive ventilatory assistance (like BiPAP or a ventilator). - Oxygenation is not sufficient, generally indicated if an FiO2 (fraction of inspired oxygen ) of 45% (which is about 6 Liters/minute via nasal cannula) or more is required to keep oxygen  saturation (SpO2) at 90% or greater. - Monitoring or Procedural Needs Beyond Program Capability: - There is a need for invasive monitoring, such as a pulmonary artery catheter or an arterial line. - There is a need for immediate-response telemetry monitoring (for dangerous arrhythmia detection and subsequent immediate intervention). - The required medication regimen is beyond the capabilities of Hospital at Home (e.g., dosing intervals are too frequent for home administration). - There is a need for a procedure that cannot be performed by the Hospital at [facility] team (e.g., significant wound debridement or abscess drainage for cellulitis, or percutaneous nephrostomy for a complicated UTI). - Significant Organ Dysfunction or Markers of Severe Illness: - Mental status is not at baseline, or there is altered mental status suggestive of inadequate perfusion. - Renal (kidney) function is unstable or showing an ongoing decline. - There is evidence of inadequate perfusion, such as metabolic acidosis or myocardial ischemia. - Uncompensated acidosis is present. - Condition-Specific Severity or Complications Making Home Care Unsuitable: -For Heart Failure: Known severe cardiac valvular disease (e.g., aortic stenosis, mitral regurgitation); or severe peripheral edema that impairs the ability to urinate or ambulate. -For COPD: Known concurrent comorbidity or finding that indicates a  higher-risk COPD exacerbation (e.g., pulmonary fibrosis, cavitation, pleural effusion, pneumothorax, rib fracture). -For Pneumonia: Pneumonia Severity Index (PSI) class V (indicating high risk for inpatient mortality); known concurrent comorbidity or finding that indicates higher-risk pneumonia (e.g., pulmonary fibrosis, cavitation, large or loculated pleural effusion); or a concomitant serious infectious process like endocarditis or empyema. -For Cellulitis: Orbital, periorbital, or necrotizing infection is suspected; or a concomitant serious infectious process like endocarditis, septic emboli, or septic joint space infection. -For UTI: Urinary tract obstruction (  e.g., kidney stone, bladder outlet obstruction); or a concomitant serious infectious process like endocarditis or septic emboli. -For Viral Illness & COVID-19: A concomitant serious infectious process like endocarditis or empyema. General Comorbidities or Status: -The patient is significantly immunosuppressed (this applies to Pneumonia, Cellulitis, UTI, Viral Illness, and COVID-19). -The patient meets inpatient admission criteria for a second diagnosis, or has care needs beyond the capabilities of Hospital at Home due to an active clinically significant comorbidity. (This is a general exclusion across all listed conditions)  Author: Elspeth JINNY Masters, MD 12/11/2023 10:23 AM  For on call review [email or web address].

## 2023-12-11 NOTE — Plan of Care (Signed)
   Problem: Nutritional: Goal: Maintenance of adequate nutrition will improve Outcome: Progressing   Problem: Metabolic: Goal: Ability to maintain appropriate glucose levels will improve Outcome: Progressing   Problem: Fluid Volume: Goal: Ability to maintain a balanced intake and output will improve Outcome: Progressing   Problem: Coping: Goal: Ability to adjust to condition or change in health will improve Outcome: Progressing

## 2023-12-11 NOTE — ED Notes (Signed)
Attempted IV x 2. Unsuccessful. 

## 2023-12-11 NOTE — ED Notes (Signed)
 Changed patient's brief and placed in clean gown.

## 2023-12-11 NOTE — H&P (Signed)
 History and Physical    Micheal Walls FMW:[id number] DOB: [birth year] DOA: 12/10/2023  PCP: [name], [name]   Patient coming from: Home   Chief Complaint: Fatigue, malaise, loss of appetite, cough  HPI: Micheal Walls is an 88 y.o. male with medical history significant for hypertension, type 2 diabetes mellitus, OSA, PAD, CVA, hypothyroidism, chronic HFpEF, and MGUS who presents with fatigue, malaise, loss of appetite, and cough.  Patient began to feel generally poor on 12/08/2023.  He has been mainly staying in bed since then with general malaise and cough.  He has not felt particularly short of breath but has not been very active at all.  He also reports a loss of appetite but denies abdominal pain or vomiting.  He reports that his urine volume seems to have decreased over the past day or 2 but denies dysuria.  ED Course: Upon arrival to the ED, patient is found to be afebrile and saturating mid 90s on room air with tachypnea, mild tachycardia, and stable blood pressure.  Labs are most notable for creatinine 2.78, albumin 2.6, AST 141, ALT 198, normal WBC, and normal lactic acid.  Chest x-ray is concerning for right upper lobe pneumonia.  Blood cultures were collected and the patient was given a liter of LR, Rocephin , and azithromycin .  Review of Systems:  All other systems reviewed and apart from HPI, are negative.  Past Medical History:  Diagnosis Date   Acute metabolic encephalopathy 01/16/2020   Amputated below knee (HCC)    right   Anemia    Aspiration pneumonia (HCC) 11/02/2019   Cataract 2019   Chronic kidney disease 2006   Diastolic heart failure (HCC)    Diverticulosis    DM (diabetes mellitus) (HCC)    GERD (gastroesophageal reflux disease) 1995   Gout    Hyperlipemia    OSA on CPAP    RBBB    Sepsis (HCC) 11/02/2019   Sleep apnea 2000   Stroke [facility]) 2002   Systemic hypertension    Thyroid  disease 2022    Past Surgical History:  Procedure Laterality  Date   ABDOMINAL AORTOGRAM W/LOWER EXTREMITY Left 08/11/2020   Procedure: ABDOMINAL AORTOGRAM W/LOWER EXTREMITY;  Surgeon: Serene Gaile ORN, MD;  Location: [facility] IN[facility]IVE [facility];  Service: Cardiovascular;  Laterality: Left;   ABDOMINAL AORTOGRAM W/LOWER EXTREMITY N/A 02/26/2021   Procedure: ABDOMINAL AORTOGRAM W/LOWER EXTREMITY;  Surgeon: Magda Debby SAILOR, MD;  Location: [facility];  Service: Cardiovascular;  Laterality: N/A;   AMPUTATION Left 09/14/2020   Procedure: AMPUTATION LEFT GREAT TOE;  Surgeon: Silva Juliene SAUNDERS, DPM;  Location: [facility] OR;  Service: Podiatry;  Laterality: Left;   AMPUTATION Left 02/07/2023   Procedure: AMPUTATION TOE 3RD,4TH, and 5TH;  Surgeon: Tobie Franky SQUIBB, DPM;  Location: [facility] OR;  Service: Orthopedics/Podiatry;  Laterality: Left;   AMPUTATION TOE Left 12/03/2021   Procedure: AMPUTATION TOE, LEFT second;  Surgeon: Joya Stabs, DPM;  Location: [facility] OR;  Service: Podiatry;  Laterality: Left;   EYE SURGERY  May & July 2019   One eye at a time   LEG AMPUTATION BELOW KNEE     right   NM MYOCAR PERF WALL MOTION  09/18/2009   no ischemia   PERIPHERAL VASCULAR BALLOON ANGIOPLASTY Left 08/11/2020   Procedure: PERIPHERAL VASCULAR BALLOON ANGIOPLASTY;  Surgeon: Serene Gaile ORN, MD;  Location: [facility];  Service: Cardiovascular;  Laterality: Left;  AT   PERIPHERAL VASCULAR INTERVENTION Left 02/26/2021   Procedure: PERIPHERAL VASCULAR INTERVENTION;  Surgeon: Magda Debby SAILOR, MD;  Location: [facility] INVASIVE CV LAB;  Service: Cardiovascular;  Laterality: Left;    Social History:   reports that he quit smoking about 22 years ago. His smoking use included cigars and cigarettes. He has a 25 pack-year smoking history. He has never used smokeless tobacco. He reports that he does not drink alcohol  and does not use drugs.  Allergies  Allergen Reactions   Vancomycin  Rash    Family History  Problem Relation Age of Onset   Diabetes Mother    Heart attack Father     Cancer Sister    Asthma Daughter      Prior to Admission medications   Medication Sig Start Date End Date Taking? Authorizing Provider  allopurinol  (ZYLOPRIM ) 100 MG tablet Take 100 mg by mouth 2 (two) times daily.    [provider]  aspirin  EC 81 MG tablet Take 81 mg by mouth daily. Swallow whole.    [provider]  Ferd Paint 1.5 % LIQD Apply between toes daily Patient not taking: Reported on 11/14/2023 09/22/22   Silva Juliene SAUNDERS, DPM  colchicine  0.6 MG tablet Take 0.6 mg by mouth once a week. 11/16/20   [provider]  Continuous Glucose Sensor (FREESTYLE LIBRE 14 DAY SENSOR) MISC Use as directed to check blood sugars 07/03/23   [name], [name]  diltiazem  (CARDIZEM  CD) 240 MG 24 hr capsule TAKE 1 CAPSULE BY MOUTH ONCE DAILY 10/27/23   [name], [name], MD  dorzolamide  (TRUSOPT ) 2 % ophthalmic solution Place 1 drop into both eyes 2 (two) times daily.    [provider]  ferrous sulfate  325 (65 FE) MG tablet Take 1 tablet (325 mg total) by mouth 2 (two) times daily with a meal. 12/04/21   [name], [name], MD  furosemide  (LASIX ) 40 MG tablet TAKE ONE (1) TABLET BY MOUTH TWICE DAILY *REFILL REQUEST* Patient taking differently: TAKE 2 TABS BY MOUTH IN THE MORNINGS AND 1 TAB BY MOUTH AT NIGHT 02/14/23   [name], [name]  gabapentin  (NEURONTIN ) 100 MG capsule TAKE TWO (2) CAPSULES BY MOUTH IN THE MORNING, TAKE 1 CAPSULE AT NOON AND 1 CAPSULE EVERY EVENING *REFILL REQUEST* 02/14/23   [name], [name]  glucose blood (FREESTYLE PRECISION NEO TEST) test strip Use as instructed 07/30/19   [name], [name]  latanoprost  (XALATAN ) 0.005 % ophthalmic solution Place 1 drop into both eyes at bedtime.  10/12/18   [provider]  Multiple Vitamins-Minerals (MULTIVITAMIN WITH MINERALS) tablet Take 1 tablet by mouth daily.    [provider]  Multiple Vitamins-Minerals (PRESERVISION AREDS) CAPS Take 2 capsules by mouth daily.    [provider]  pantoprazole  (PROTONIX ) 40 MG tablet TAKE 1 TABLET BY MOUTH EVERY DAY AT BEDTIME *REFILL REQUEST* 02/14/23   [name], [name]  rosuvastatin  (CRESTOR ) 20 MG tablet TAKE 1 TABLET (20MG  TOTAL) BY MOUTH AT BEDTIME 10/30/23   [name], [name], MD  Semaglutide , 2 MG/DOSE, (OZEMPIC , 2 MG/DOSE,) 8 MG/3ML SOPN Inject 2 mg into the skin once a week. 07/03/23   [name], [name]  senna-docusate (SENOKOT-S) 8.6-50 MG tablet Take 1 tablet by mouth 2 (two) times daily between meals as needed for mild constipation or moderate constipation. Patient taking differently: Take 1 tablet by mouth 2 (two) times daily as needed (constipation). 10/24/20   [name], [name], MD  SYNTHROID  25 MCG tablet Take 1 tablet by mouth Monday - Friday 10/24/23   Moore, Janece, [name]  tamsulosin  (FLOMAX ) 0.4 MG CAPS capsule  Take 0.4 mg by mouth daily.    [provider]    Physical Exam: Vitals:   12/11/23 0130 12/11/23 0145 12/11/23 0151 12/11/23 0215  BP: (!) 170/85 (!) 159/82  (!) 176/90  Pulse: 99 100  (!) 102  Resp: (!) 25 19  (!) 26  Temp:    98.8 F (37.1 C)  TempSrc:    Oral  SpO2: 94% 99%  92%  Weight:   123 kg   Height:   6' 1 (1.854 m)     Constitutional: NAD, no pallor or diaphoresis   Eyes: PERTLA, lids and conjunctivae normal ENMT: Mucous membranes are moist. Posterior pharynx clear of any exudate or lesions.   Neck: supple, no masses  Respiratory: Coarse rales on right. No wheezing. No accessory muscle use.  Cardiovascular: S1 & S2 heard, regular rate and rhythm. No JVD. Abdomen: Soft, non-tender. Bowel sounds active.  Musculoskeletal: no clubbing / cyanosis. S/p right BKA.   Skin: no significant rashes, lesions, ulcers. Warm, dry, well-perfused. Neurologic: CN 2-12 grossly intact aside from hearing deficit. Moving all extremities. Alert and oriented.  Psychiatric: Calm. Cooperative.    Labs and Imaging on Admission: I have personally reviewed following labs and imaging studies  CBC: Recent Labs   Lab 12/10/23 2327  WBC 9.8  HGB 11.2*  HCT 34.3*  MCV 86.2  PLT 324   Basic Metabolic Panel: Recent Labs  Lab 12/10/23 2327  NA 136  K 3.8  CL 101  CO2 20*  GLUCOSE 175*  BUN 75*  CREATININE 2.78*  CALCIUM  10.0   GFR: Estimated Creatinine Clearance: 26.2 mL/min (A) (by C-G formula based on SCr of 2.78 mg/dL (H)). Liver Function Tests: Recent Labs  Lab 12/10/23 2327  AST 141*  ALT 198*  ALKPHOS 100  BILITOT 0.9  PROT 7.4  ALBUMIN 2.6*   No results for input(s): LIPASE, AMYLASE in the last 168 hours. No results for input(s): AMMONIA in the last 168 hours. Coagulation Profile: Recent Labs  Lab 12/10/23 2327  INR 1.3*   Cardiac Enzymes: Recent Labs  Lab 12/10/23 2327  CKTOTAL 482*   BNP (last 3 results) No results for input(s): PROBNP in the last 8760 hours. HbA1C: No results for input(s): HGBA1C in the last 72 hours. CBG: No results for input(s): GLUCAP in the last 168 hours. Lipid Profile: No results for input(s): CHOL, HDL, LDLCALC, TRIG, CHOLHDL, LDLDIRECT in the last 72 hours. Thyroid  Function Tests: No results for input(s): TSH, T4TOTAL, FREET4, T3FREE, THYROIDAB in the last 72 hours. Anemia Panel: No results for input(s): VITAMINB12, FOLATE, FERRITIN, TIBC, IRON, RETICCTPCT in the last 72 hours. Urine analysis:    Component Value Date/Time   COLORURINE YELLOW 12/10/2023 2300   APPEARANCEUR CLEAR 12/10/2023 2300   LABSPEC 1.010 12/10/2023 2300   PHURINE 6.0 12/10/2023 2300   GLUCOSEU NEGATIVE 12/10/2023 2300   HGBUR NEGATIVE 12/10/2023 2300   BILIRUBINUR NEGATIVE 12/10/2023 2300   BILIRUBINUR Negative 08/11/2021 1459   KETONESUR NEGATIVE 12/10/2023 2300   PROTEINUR 100 (A) 12/10/2023 2300   UROBILINOGEN 0.2 08/11/2021 1459   UROBILINOGEN 0.2 01/08/2011 1558   NITRITE NEGATIVE 12/10/2023 2300   LEUKOCYTESUR NEGATIVE 12/10/2023 2300   Sepsis  Labs: @LABRCNTIP (procalcitonin:4,lacticidven:4) ) Recent Results (from the past 240 hours)  Resp panel by RT-PCR (RSV, Flu A&B, Covid) Anterior Nasal Swab     Status: None   Collection Time: 12/11/23  1:15 AM   Specimen: Anterior Nasal Swab  Result Value Ref Range Status   SARS  Coronavirus 2 by RT PCR NEGATIVE NEGATIVE Final   Influenza A by PCR NEGATIVE NEGATIVE Final   Influenza B by PCR NEGATIVE NEGATIVE Final    Comment: (NOTE) The Xpert Xpress SARS-CoV-2/FLU/RSV plus assay is intended as an aid in the diagnosis of influenza from Nasopharyngeal swab specimens and should not be used as a sole basis for treatment. Nasal washings and aspirates are unacceptable for Xpert Xpress SARS-CoV-2/FLU/RSV testing.  Fact Sheet for Patients: BloggerCourse.com  Fact Sheet for Healthcare Providers: SeriousBroker.it  This test is not yet approved or cleared by the [place]  FDA and has been authorized for detection and/or diagnosis of SARS-CoV-2 by FDA under an Emergency Use Authorization (EUA). This EUA will remain in effect (meaning this test can be used) for the duration of the COVID-19 declaration under Section 564(b)(1) of the Act, 21 U.S.C. section 360bbb-3(b)(1), unless the authorization is terminated or revoked.     Resp Syncytial Virus by PCR NEGATIVE NEGATIVE Final    Comment: (NOTE) Fact Sheet for Patients: BloggerCourse.com  Fact Sheet for Healthcare Providers: SeriousBroker.it  This test is not yet approved or cleared by the [place]  FDA and has been authorized for detection and/or diagnosis of SARS-CoV-2 by FDA under an Emergency Use Authorization (EUA). This EUA will remain in effect (meaning this test can be used) for the duration of the COVID-19 declaration under Section 564(b)(1) of the Act, 21 U.S.C. section 360bbb-3(b)(1), unless the authorization is  terminated or revoked.  Performed at [facility], [address], [place], [place] [address]      Radiological Exams on Admission: DG Chest 2 View Result Date: 12/10/2023 CLINICAL DATA:  Fall weakness and urinary frequency, unable to get out of bed x2 days. EXAM: CHEST - 2 VIEW COMPARISON:  Chest AP Lat 10/21/2020 FINDINGS: Low inspiration on exam with elevated right hemidiaphragm. There is a patchy airspace infiltrate in the right upper lobe consistent with pneumonia in this clinical setting. Rest of the hypoinflated lungs are generally clear apart from linear atelectasis over the elevated right diaphragm. No pleural effusion is seen.  The cardiac size is normal. The mediastinum is normally outlined, with aortic atherosclerosis. Moderate thoracic spondylosis. IMPRESSION: 1. Patchy airspace infiltrate in the right upper lobe consistent with pneumonia in this clinical setting. Follow-up chest x-ray recommended to ensure resolution. 2. Low inspiration with elevated right hemidiaphragm. 3. Aortic atherosclerosis. Electronically Signed   By: Francis Quam M.D.   On: 12/10/2023 23:24    EKG: Independently reviewed. Sinus rhythm, 1st degree AV block, PVCs, RBBB.   Assessment/Plan   1. Pneumonia  - Check strep pneumo and legionella antigens, continue empiric antibiotics and supportive care, follow cultures and clinical course    2. AKI superimposed on CKD IV  - Likely prerenal in setting of recent anorexia  - Hold Lasix , continue gentle IVF hydration, renally-dose medications, repeat chem panel    3. Elevated transaminases  - AST is 141 and ALT 198 with normal bilirubin; transaminases previously normal  - No RUQ pain  - Check viral hepatitis panel, hold Crestor  for now, repeat CMP in am   4. Chronic HFpEF  - Hold diuretic with hydrating with IVF, monitor volume status    5. Type II DM  - Check CBGs and use low-intensity SSI for now    6. MGUS  - Appears stable   7. OSA  - CPAP  while sleeping   8. Hypothyroidism  - Synthroid    9. Hx of CVA; PAD  -  Continue ASA, hold statin while trending LFTs    DVT prophylaxis: Sq heparin   Code Status: Full   Level of Care: Level of care: Telemetry Medical Family Communication: None present  Disposition Plan:  Patient is from: Home  Anticipated d/c is to: TBD Anticipated d/c date is: 12/14/23  Patient currently: Pending treatment of pneumonia and AKI, trend in LFTs, may need PT eval  Consults called: None  Admission status: Inpatient     Evalene GORMAN Sprinkles, MD [facility]  12/11/2023, 3:34 AM

## 2023-12-11 NOTE — Progress Notes (Addendum)
 1933---Contact call to patient to confirm and review plan for self-administration medication and obtain BP reading. Patient reached via phone. Patient identifiers reviewed. RN introduction completed. Patient confirmed ready to take blood pressure with family assistance. Family unable to start BP reading via tablet.   1941--Family reported BP 96/52 LA  HR 93 patient sitting. Patient informed RN will call via current health video.  1944--Contact call via current health video. Reassessment of Blood pressures completed during video contact.  94/45 LA  HR 96 and 90/55 RA  HR 97. Assessment, review of [facility] contact information reviewed. Plan for Dexcom reading discussed. Signs of hypotension reviewed patient denied symptoms. Education provided to assisting family for taking blood pressure. APP notified of BP readings. Patient informed BP will be reassessed again within 30 mins. Patient confirmed he will have assistance with Dexcom placement within 30 mins., meter will take 1 hr to connect.   2100---additional contact call via current health video for additional BP reading and update on Dexcom. Additional education provided to assisting family applying blood pressure cuff. BP reading now 113/69 LA  HR 97. Patient confirmed Dexcom still had 51 mins and is planning to place CPAP on and head to bed. Medication administration completed. Plan for early AM medications discussed. Patient and family encouraged and agreed to call [facility] contact number or use tablet to contact RN if blood sugar reading is out of range once connected or if any other needs are identified throughout the night. APP notified of updated BP readings and plan for Dexcom reading. Previous low BP readings may have been inaccurate based on cuff placement  and fit. Recent CMP results also review and discussed with APP.

## 2023-12-11 NOTE — Plan of Care (Signed)
 [facility] into [facility]. He arrived to his home in stable condition. Pt appears tired, but oriented x4. No complaints of pain. Denies SOB, headache, and dizziness. No acute distress noted. Plan of care explained with patient and wife. Patient was educated on how to notify the [facility] team.   Plan of care onging  Problem: Activity: Goal: Ability to tolerate increased activity will improve Outcome: Progressing   Problem: Clinical Measurements: Goal: Ability to maintain a body temperature in the normal range will improve Outcome: Progressing   Problem: Respiratory: Goal: Ability to maintain adequate ventilation will improve Outcome: Progressing Goal: Ability to maintain a clear airway will improve Outcome: Progressing

## 2023-12-11 NOTE — Evaluation (Signed)
 Physical Therapy Evaluation Patient Details Name: Micheal Walls MRN: [id number] DOB: [birth year] Today's Date: 12/11/2023  History of Present Illness  88 y.o. male presents to [facility] hospital on 12/10/2023 with fatigue, malaise, loss of appetite and cough. Chest x-ray is concerning for RUL PNA. PMH includes HTN, DMII, OSA, PAD, CVA, hypothyroidism, HFpEF, MGUS.  Clinical Impression  Prior to admittance pt was utilizing power chair for mobilization and performed pivot transfers w/ RW; pt reports independence w/ ADLs. Pt presents to evaluation with deficits in mobility, activity tolerance, power, and balance, all limiting pt's ability to mobilize. Pt performed STS from edge of bed w/ RW and no physical assistance. Unable to perform pivot transfer as per home setup as pt reports bedside chair is significantly lower than his power chair at home. Pt would benefit from further transfer training. PT will continue to treat pt while he is admitted. Pt reports mobilizing near baseline and feels family is able to assist him if needed; no follow-up therapies recommended.         If plan is discharge home, recommend the following: A little help with walking and/or transfers;Assist for transportation;Help with stairs or ramp for entrance   Can travel by private vehicle        Equipment Recommendations None recommended by PT  Recommendations for Other Services       Functional Status Assessment Patient has had a recent decline in their functional status and demonstrates the ability to make significant improvements in function in a reasonable and predictable amount of time.     Precautions / Restrictions Precautions Precautions: Fall Recall of Precautions/Restrictions: Intact Restrictions Weight Bearing Restrictions Per Provider Order: No      Mobility  Bed Mobility Overal bed mobility: Modified Independent             General bed mobility comments: increased time and effort to complete. Pt  able to don prosthetic sitting EOB mod I; increased time.    Transfers Overall transfer level: Needs assistance Equipment used: Rolling walker (2 wheels) Transfers: Sit to/from Stand Sit to Stand: Supervision           General transfer comment: STS from EOB w/ RW and supervision. Increased time to complete.    Ambulation/Gait                  Stairs            Wheelchair Mobility     Tilt Bed    Modified Rankin (Stroke Patients Only)       Balance Overall balance assessment: Needs assistance Sitting-balance support: No upper extremity supported, Feet unsupported Sitting balance-Leahy Scale: Good Sitting balance - Comments: seated EOB; able to don prosthetic w/out UE or LE stabilized on surface   Standing balance support: Bilateral upper extremity supported, During functional activity, Reliant on assistive device for balance Standing balance-Leahy Scale: Poor Standing balance comment: reliant on external support                             Pertinent Vitals/Pain Pain Assessment Pain Assessment: No/denies pain    Home Living Family/patient expects to be discharged to:: Private residence Living Arrangements: Spouse/significant other Available Help at Discharge: Family;Available PRN/intermittently (pt's son and nephew) Type of Home: House Home Access: Ramped entrance       Home Layout: One level Home Equipment: Agricultural consultant (2 wheels);Cane - single point;Crutches;BSC/3in1;Shower seat;Grab bars - toilet;Grab bars - tub/shower;Wheelchair - power  Prior Function Prior Level of Function : Independent/Modified Independent             Mobility Comments: pt utilizes power chair for mobility and then transfers to toilet via pivot w/ RW ADLs Comments: independent     Extremity/Trunk Assessment   Upper Extremity Assessment Upper Extremity Assessment: Generalized weakness    Lower Extremity Assessment Lower Extremity  Assessment: Generalized weakness    Cervical / Trunk Assessment Cervical / Trunk Assessment: Kyphotic  Communication   Communication Communication: Impaired Factors Affecting Communication: Hearing impaired    Cognition Arousal: Alert Behavior During Therapy: WFL for tasks assessed/performed   PT - Cognitive impairments: No apparent impairments                         Following commands: Impaired Following commands impaired: Follows one step commands with increased time     Cueing Cueing Techniques: Verbal cues, Visual cues     General Comments General comments (skin integrity, edema, etc.): no signs of acute distress    Exercises     Assessment/Plan    PT Assessment Patient needs continued PT services  PT Problem List Decreased strength;Decreased activity tolerance;Decreased balance;Decreased mobility;Decreased coordination       PT Treatment Interventions DME instruction;Functional mobility training;Therapeutic activities;Therapeutic exercise;Balance training;Patient/family education;Wheelchair mobility training;Manual techniques;Modalities    PT Goals (Current goals can be found in the Care Plan section)  Acute Rehab PT Goals Patient Stated Goal: to go home PT Goal Formulation: With patient Time For Goal Achievement: 12/25/23 Potential to Achieve Goals: Good    Frequency Min 1X/week     Co-evaluation               AM-PAC PT 6 Clicks Mobility  Outcome Measure Help needed turning from your back to your side while in a flat bed without using bedrails?: A Little Help needed moving from lying on your back to sitting on the side of a flat bed without using bedrails?: A Little Help needed moving to and from a bed to a chair (including a wheelchair)?: A Little Help needed standing up from a chair using your arms (e.g., wheelchair or bedside chair)?: A Little Help needed to walk in hospital room?: Total Help needed climbing 3-5 steps with a  railing? : Total 6 Click Score: 14    End of Session Equipment Utilized During Treatment: Gait belt Activity Tolerance: Patient tolerated treatment well Patient left: in bed;with call bell/phone within reach;with nursing/sitter in room Nurse Communication: Mobility status PT Visit Diagnosis: Muscle weakness (generalized) (M62.81)    Time: 9167-9079 PT Time Calculation (min) (ACUTE ONLY): 48 min   Charges:   PT Evaluation $PT Eval Low Complexity: 1 Low   PT General Charges $$ ACUTE PT VISIT: 1 Visit         Leontine Hilt, SPT Acute Rehab [phone]   Leontine Hilt 12/11/2023, 11:09 AM

## 2023-12-11 NOTE — TOC Initial Note (Signed)
 Transition of Care [name]) - Initial/Assessment Note    Patient Details  Name: Micheal Walls MRN: [id number] Date of Birth: [birth year]  Transition of Care [facility]) CM/SW Contact:    Debarah Saunas, RN Phone Number: 12/11/2023, 11:15 AM  Clinical Narrative:                 88 y.o. male with medical history significant for hypertension, type 2 diabetes mellitus, OSA, PAD, CVA, hypothyroidism, chronic HFpEF, and MGUS who presents with fatigue, malaise, loss of appetite, and cough.   Expected Discharge Plan: Home w Hospice Care Barriers to Discharge: Continued Medical Work up   Patient Goals and CMS Choice Patient states their goals for this hospitalization and ongoing recovery are:: to go home to continue caring for his wife CMS Medicare.gov Compare Post Acute Care list provided to:: Patient        Expected Discharge Plan and Services In-house Referral: PCP / Health Connect   Post Acute Care Choice: Home Health Living arrangements for the past 2 months: Single Family Home                 RNCM has recommended that this patient have Home Health Services after discharge but pt declines at this time. I have discussed the benefits of home health services as wll as the risks of not having home health services with pt. RNCM informed patient that if he changed his mind, his primary care physician can order home health from office.The patient verbalizes understanding. RNCM will continue to follow for additional discharge needs.         HH Arranged: Refused HH          Prior Living Arrangements/Services Living arrangements for the past 2 months: Single Family Home Lives with:: Spouse (adult grandson lives close by) Patient language and need for interpreter reviewed:: Yes Do you feel safe going back to the place where you live?: Yes      Need for Family Participation in Patient Care: Yes (Comment) Care giver support system in place?: Yes (comment) Current home services: DME  (wheelchair, rolling walker) Criminal Activity/Legal Involvement Pertinent to Current Situation/Hospitalization: No - Comment as needed  Activities of Daily Living      Permission Sought/Granted Permission sought to share information with : Case Manager, Family Supports Permission granted to share information with : Yes, Verbal Permission Granted           Permission granted to share info w Contact Information: children, [place] and [employer]  Emotional Assessment Appearance:: Appears stated age Attitude/Demeanor/Rapport: Engaged, Ambitious, Self-Confident, Gracious Affect (typically observed): Accepting, Pleasant, Happy, Appropriate Orientation: : Oriented to Self, Oriented to Place, Oriented to  Time, Oriented to Situation Alcohol  / Substance Use: Tobacco Use (former) Psych Involvement: No (comment)  Admission diagnosis:  Pneumonia [J18.9] Patient Active Problem List   Diagnosis Date Noted   Pneumonia 12/11/2023   Acute renal failure superimposed on stage 4 chronic kidney disease (HCC) 12/11/2023   Elevated transaminase level 12/11/2023   Hypothyroidism 12/11/2023   Hypertensive heart disease with chronic diastolic congestive heart failure (HCC) 09/17/2023   Class 1 obesity due to excess calories with body mass index (BMI) of 34.0 to 34.9 in adult 09/17/2023   Type 2 diabetes mellitus with stage 4 chronic kidney disease, with long-term current use of insulin  (HCC) 04/04/2023   Need for influenza vaccination 04/04/2023   COVID-19 vaccine administered 04/04/2023   Right sided abdominal pain 04/04/2023   Acute osteomyelitis of left foot (HCC) 02/05/2023  Amputation of toe of left foot (HCC) 12/13/2022   Malignant essential hypertension with CHF with renal disease (HCC) 12/13/2022   MGUS (monoclonal gammopathy of unknown significance) 01/24/2022   Normocytic anemia 12/13/2021   Toe osteomyelitis, left in setting of PAD and T2DM  12/02/2021   Proliferative diabetic retinopathy  of left eye (HCC) 10/18/2021   Chronic diastolic (congestive) heart failure (HCC) 08/11/2021   Pseudophakia of both eyes 04/12/2021   Infection of amputation stump of left lower extremity (HCC) 10/21/2020   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HCC)    Left ankle swelling    Diabetic foot infection (HCC)    Cerebral thrombosis with cerebral infarction 07/20/2020   Thrombocytopenia (HCC) 07/19/2020   Cerebral ischemia 07/18/2020   Bilateral hearing loss 05/20/2020   Does use hearing aid 05/20/2020   Ulcer of left foot with fat layer exposed (HCC) 01/16/2020   Proliferative diabetic retinopathy of right eye (HCC) 01/06/2020   Intermediate stage nonexudative age-related macular degeneration of both eyes 01/06/2020   Left epiretinal membrane 01/06/2020   Drusen of right macula 01/06/2020   Cellulitis 11/02/2019   Elevated TSH 07/10/2018   Nephropathy 02/28/2018   Disturbance of skin sensation 02/28/2018   PAD (peripheral artery disease) (HCC) 07/17/2017   110.1 10/21/2013   Epiphora 04/24/2013   Laxity of eyelid 04/24/2013   Anemia 04/09/2013   History of peripheral vascular disease 04/09/2013   History of stroke 04/09/2013   S/P unilateral BKA (below knee amputation) (HCC) 03/17/2013   Type 2 diabetes mellitus with obesity (HCC) 03/17/2013   Mixed hyperlipidemia 03/17/2013   Essential hypertension 03/17/2013   Gout 03/17/2013   CKD (chronic kidney disease), stage III (HCC) 03/17/2013   OSA on CPAP 03/17/2013   Morbid obesity (HCC) 03/17/2013   Anemia, iron deficiency 03/17/2013   Diverticulosis of colon 03/17/2013   RBBB 03/17/2013   Punctal stenosis, acquired 10/02/2012   PCP:  [name], [name] Pharmacy:   [id number] - [place], [place] - [address] [place] [address] Phone: [phone] Fax: [phone]  [facility] - [address], [facility] - [address] [place] [address] Phone: [phone] Fax:  [phone]  [facility] [place]  - [place], [place] - [address] [place] [address] Phone: [phone] Fax: [phone]  [facility] [place]  - [place], [place] - [address] [facility] [address] [place] [address] Phone: [phone] Fax: [phone]     Social Drivers of Health (SDOH) Social History: SDOH Screenings   Food Insecurity: Unknown (12/11/2023)  Housing: High Risk (12/11/2023)  Transportation Needs: Unmet Transportation Needs (12/11/2023)  Utilities: Not At Risk (06/02/2023)  Alcohol  Screen: Low Risk  (06/02/2023)  Depression (PHQ2-9): Low Risk  (06/02/2023)  Financial Resource Strain: Low Risk  (08/20/2023)  Physical Activity: Inactive (12/11/2023)  Social Connections: Moderately Integrated (08/20/2023)  Recent Concern: Social Connections - Moderately Isolated (06/02/2023)  Stress: No Stress Concern Present (08/20/2023)  Tobacco Use: Medium Risk (12/11/2023)  Health Literacy: Adequate Health Literacy (12/11/2023)   SDOH Interventions: Transportation Interventions: Patient Resources (Friends/Family) Health Literacy Interventions: Intervention Not Indicated   Readmission Risk Interventions    12/11/2023   11:10 AM  Readmission Risk Prevention Plan  Transportation Screening Complete  PCP or Specialist Appt within 5-7 Days Complete  Home Care Screening Complete  Medication Review (RN CM) Complete

## 2023-12-11 NOTE — Progress Notes (Signed)
 Pt admitted to [facility] program and home safety assessment performed upon arrival . Pt has Right BKA with prosthesis, and is able to transfer to and from a motorized scooter in the home. Pt is unable to stand without assistance to use walker and therefore we were unable to obtain an accurate weight using standing scale. Pt's home has clear walkways in the main areas he uses. Pt mainly stays in open dining room/ living room area on his motorized scooter but also occasionally goes into his bedroom. Pt's son lives in the home, but is at work currently. Pt's 2 young granddaughters, pt's son and pt's wife live in the home. Pt's grandson is not currently present. Pt is hard of hearing but seems to understand using the tablet to call the virtual RN if needed fairly well. No major safety concerns noted at this time.

## 2023-12-12 ENCOUNTER — Encounter: Payer: Self-pay | Admitting: Nurse Practitioner

## 2023-12-12 ENCOUNTER — Encounter (HOSPITAL_COMMUNITY): Payer: Self-pay | Admitting: Family Medicine

## 2023-12-12 DIAGNOSIS — J189 Pneumonia, unspecified organism: Secondary | ICD-10-CM | POA: Diagnosis not present

## 2023-12-12 DIAGNOSIS — M7989 Other specified soft tissue disorders: Secondary | ICD-10-CM | POA: Insufficient documentation

## 2023-12-12 LAB — CBC
HCT: 29.6 % — ABNORMAL LOW (ref 39.0–52.0)
Hemoglobin: 9.9 g/dL — ABNORMAL LOW (ref 13.0–17.0)
MCH: 29 pg (ref 26.0–34.0)
MCHC: 33.4 g/dL (ref 30.0–36.0)
MCV: 86.8 fL (ref 80.0–100.0)
Platelets: 295 10*3/uL (ref 150–400)
RBC: 3.41 MIL/uL — ABNORMAL LOW (ref 4.22–5.81)
RDW: 15.9 % — ABNORMAL HIGH (ref 11.5–15.5)
WBC: 8.3 10*3/uL (ref 4.0–10.5)
nRBC: 0 % (ref 0.0–0.2)

## 2023-12-12 LAB — COMPREHENSIVE METABOLIC PANEL WITH GFR
ALT: 177 U/L — ABNORMAL HIGH (ref 0–44)
AST: 118 U/L — ABNORMAL HIGH (ref 15–41)
Albumin: 2.5 g/dL — ABNORMAL LOW (ref 3.5–5.0)
Alkaline Phosphatase: 98 U/L (ref 38–126)
Anion gap: 12 (ref 5–15)
BUN: 65 mg/dL — ABNORMAL HIGH (ref 8–23)
CO2: 23 mmol/L (ref 22–32)
Calcium: 9.9 mg/dL (ref 8.9–10.3)
Chloride: 103 mmol/L (ref 98–111)
Creatinine, Ser: 2.59 mg/dL — ABNORMAL HIGH (ref 0.61–1.24)
GFR, Estimated: 23 mL/min — ABNORMAL LOW (ref >60)
Glucose, Bld: 168 mg/dL — ABNORMAL HIGH (ref 70–99)
Potassium: 4 mmol/L (ref 3.5–5.1)
Sodium: 138 mmol/L (ref 135–145)
Total Bilirubin: 0.8 mg/dL (ref 0.0–1.2)
Total Protein: 7.6 g/dL (ref 6.5–8.1)

## 2023-12-12 LAB — STREP PNEUMONIAE URINARY ANTIGEN: Strep Pneumo Urinary Antigen: NEGATIVE

## 2023-12-12 MED ORDER — SODIUM CHLORIDE 0.9 % IV BOLUS
500.0000 mL | Freq: Once | INTRAVENOUS | Status: AC
  Administered 2023-12-12: 500 mL via INTRAVENOUS

## 2023-12-12 MED ORDER — ENOXAPARIN SODIUM 30 MG/0.3ML IJ SOSY
30.0000 mg | PREFILLED_SYRINGE | Freq: Every day | INTRAMUSCULAR | Status: DC
  Administered 2023-12-12 – 2023-12-17 (×6): 30 mg via SUBCUTANEOUS
  Filled 2023-12-12 (×7): qty 0.3

## 2023-12-12 NOTE — Plan of Care (Addendum)
 Patient alert and oriented, appropriate with staff and cooperative. Video visit with provider. Has good support of spouse and grandaughter at home. Denies pain, endorses some trace edema, pic taken and uploaded provider assessed during am visit. Spouse reports patient is eating well. Afternoon video visit provider present. Critical lab reviewed with provider. Cr elevated provider discussed hydration with Mr Standley. Provider ordered bolus NS 500ml. Patient also given Rocephin  IV the patients line dislodged. Patient transported to [facility] for line placement. IV Vasc team assessed patient will need a port or PICC line because of patient renal function they suggest a nephrology consult. The IV team were able to place a peripheral line. Medic Took IMM today. Completed handoff to night RN.    Problem: Education: Goal: Ability to describe self-care measures that may prevent or decrease complications (Diabetes Survival Skills Education) will improve Outcome: Progressing   Problem: Fluid Volume: Goal: Ability to maintain a balanced intake and output will improve Outcome: Progressing Note: PO lasix  and instructed per provider sips of fluid prefer water through day.   Problem: Health Behavior/Discharge Planning: Goal: Ability to manage health-related needs will improve Outcome: Progressing   Problem: Metabolic: Goal: Ability to maintain appropriate glucose levels will improve Outcome: Progressing   Problem: Nutritional: Goal: Maintenance of adequate nutrition will improve Outcome: Progressing   Problem: Activity: Goal: Ability to tolerate increased activity will improve Outcome: Progressing   Problem: Respiratory: Goal: Ability to maintain adequate ventilation will improve Outcome: Progressing

## 2023-12-12 NOTE — Assessment & Plan Note (Signed)
 Improving.

## 2023-12-12 NOTE — Assessment & Plan Note (Signed)
 Appears at baseline, continue outpatient followup as appropriate

## 2023-12-12 NOTE — Assessment & Plan Note (Signed)
 Appears trace and at baseline Continue daily furosemide  40 mg daily

## 2023-12-12 NOTE — Progress Notes (Addendum)
 Current health Alert: Tachypnea. Patient alerting for Tachypnea x30 mins Call to wife who confirmed with patient on speaker, no SOB or distress at time of call. Patient up in chair straightening his room and doing well. Data showing intermittent considerable and moderate movement. Patient also reported Dexcom reading is currently 73, denies s/sx of hyperglycemia reported had a late meal. Plan when to call [facility] RN, Will continue to monitor and encourage rest. APP notified of update, no changes or new orders at this time.   9941: Current health Alert: Tachypnea. Patient called. Patient and wife confirmed patient is breathing without distress. Patient informed of considerable to moderate movement from current health data, patient confirmed accuracy. Education provided: impact of Tachypnea, Pneumonia, and safety. [facility] contact reviewed. Will continue to monitor.   0145--. Patient called, for current health Alert: Tachypnea. Patient confirmed still up and moving around and now going to bed for the night and will apply CPAP. Denies distress or need for emergent care. Will continue to monitor.

## 2023-12-12 NOTE — Progress Notes (Signed)
 Tachypnea Alert: Patient confirmed stable, reported wearable was taken off, while eating, and on the arm of the chair. Patient encouraged and agreed to replace device back on. Denies distress or need for emergent care. [facility] contact information reviewed.

## 2023-12-12 NOTE — Assessment & Plan Note (Addendum)
 RUL pna Patient endorses improved weakness, fatigue and cough on initial presentation with noted RUL pneumonia on imaging 12/10/22  Cough remains nonproductive Maintain spO2 > 92% active hypoxia at present  PSI score 4  I/S Continue to monitor blood cultures, no growth to date (day1)   Continue IV ceftriaxone  and PO azithromycin  in ER overnight; 12/12/23 is day 3 of abx Continue to monitor clinical course

## 2023-12-12 NOTE — Telephone Encounter (Signed)
 Did we do a TCM? And get him an appt

## 2023-12-12 NOTE — Assessment & Plan Note (Addendum)
 Continue home asa 81 mg daily

## 2023-12-12 NOTE — Assessment & Plan Note (Signed)
 Home diltiazem  240 mg daily continued

## 2023-12-12 NOTE — Plan of Care (Addendum)
  Problem: Coping: Goal: Ability to adjust to condition or change in health will improve Outcome: Progressing   Problem: Fluid Volume: Goal: Ability to maintain a balanced intake and output will improve Outcome: Progressing   Problem: Metabolic: Goal: Ability to maintain appropriate glucose levels will improve Outcome: Progressing   Problem: Respiratory: Goal: Ability to maintain adequate ventilation will improve Outcome: Progressing Goal: Ability to maintain a clear airway will improve Outcome: Progressing   Video call to patient to assess condition and needs. Paramedics at the home. Medications already completed, patient denied pain. Review of Dexcom numbers and [facility] contact information reviewed.   Call to spouse to assess needs and to answer any questions, spouse provided additonal assessment questions and was also provided the plan for the remainder of the night. Hypoxia alert addressed while on call.  Review of PNA dx, IV insertion, and fluids. Will continue to monitor patient needs. [facility] contact information reviewed.

## 2023-12-12 NOTE — Assessment & Plan Note (Signed)
 -  This complicates overall care and prognosis.

## 2023-12-12 NOTE — TOC Progression Note (Signed)
 Transition of Care [facility]) - Progression Note    Patient Details  Name: Micheal Walls MRN: [id number] Date of Birth: January 10, 1936  Transition of Care [facility]) CM/SW Contact  Debarah Saunas, RN Phone Number: 12/12/2023, 11:27 AM  Clinical Narrative:    RNCM spoke with pt via telephone regarding home health services.  Pt has [facility] for both he and his wife and is satisfied with the care they give.  RNCM suggested HHRN to access home for safety features (grab bars).  Pt states he had some installed everywhere around his home that had studs and did not think his home could support anymore in the places he would need them.  TOC will continue to follow should he change his mind.   Expected Discharge Plan: Home w Hospice Care Barriers to Discharge: Continued Medical Work up  Expected Discharge Plan and Services In-house Referral: PCP / Health Connect   Post Acute Care Choice: Home Health Living arrangements for the past 2 months: Single Family Home                           HH Arranged: Refused HH; has [facility]           Social Determinants of Health (SDOH) Interventions SDOH Screenings   Food Insecurity: No Food Insecurity (12/11/2023)  Housing: High Risk (12/11/2023)  Transportation Needs: Unmet Transportation Needs (12/11/2023)  Utilities: Not At Risk (12/11/2023)  Alcohol  Screen: Low Risk  (06/02/2023)  Depression (PHQ2-9): Low Risk  (06/02/2023)  Financial Resource Strain: Low Risk  (08/20/2023)  Physical Activity: Inactive (12/11/2023)  Social Connections: Moderately Integrated (12/11/2023)  Stress: No Stress Concern Present (08/20/2023)  Tobacco Use: Medium Risk (12/12/2023)  Health Literacy: Adequate Health Literacy (12/11/2023)    Readmission Risk Interventions    12/11/2023   11:10 AM  Readmission Risk Prevention Plan  Transportation Screening Complete  PCP or Specialist Appt within 5-7 Days Complete  Home Care Screening Complete  Medication Review (RN CM)  Complete

## 2023-12-12 NOTE — Progress Notes (Addendum)
 PROGRESS NOTE    Micheal Walls   FMW:[id number]  DOB: 1936-02-17  PCP: [name], [name]    DOA: 12/10/2023 LOS: 1   Brief Narrative   Micheal Walls is an 88 y.o. male with medical history significant for hypertension, type 2 diabetes mellitus, OSA, PAD, CVA, hypothyroidism, chronic HFpEF, and MGUS who presents with fatigue, malaise, loss of appetite, and cough.   Patient began to feel generally poor on 12/08/2023.  He has been mainly staying in bed since then with general malaise and cough.  He has not felt particularly short of breath but has not been very active at all.  He also reports a loss of appetite but denies abdominal pain or vomiting.  He reports that his urine volume seems to have decreased over the past day or 2 but denies dysuria.   ED Course: Upon arrival to the ED, patient is found to be afebrile and saturating mid 90s on room air with tachypnea, mild tachycardia, and stable blood pressure.  Labs are most notable for creatinine 2.78, albumin 2.6, AST 141, ALT 198, normal WBC, and normal lactic acid.  Chest x-ray is concerning for right upper lobe pneumonia.   Blood cultures were collected and the patient was given a liter of LR, Rocephin , and azithromycin .  Covid/influenza A/influenza B/RSV PCR were negative.   Assessment & Plan   Principal Problem:   PNA (pneumonia) Active Problems:   Essential hypertension   OSA on CPAP   Morbid obesity (HCC)   History of stroke   Acute renal failure superimposed on stage 4 chronic kidney disease (HCC)   Chronic diastolic (congestive) heart failure (HCC)   MGUS (monoclonal gammopathy of unknown significance)   Hypothyroidism   S/P unilateral BKA (below knee amputation) (HCC)   PAD (peripheral artery disease) (HCC)   Type 2 diabetes mellitus with stage 4 chronic kidney disease, with long-term current use of insulin  (HCC)   Elevated transaminase level   Swelling of left lower extremity   Assessment and Plan:  * PNA  (pneumonia) RUL pna Patient endorses improved weakness, fatigue and cough on initial presentation with noted RUL pneumonia on imaging 12/10/22  Cough remains nonproductive Maintain spO2 > 92% active hypoxia at present  PSI score 4  I/S Continue to monitor blood cultures, no growth to date (day1)   Continue IV ceftriaxone  and PO azithromycin  in ER overnight; 12/12/23 is day 3 of abx Continue to monitor clinical course  Acute renal failure superimposed on stage 4 chronic kidney disease (HCC) Improving, eGFR is at baseline Continue daily monitoring BMP in the AM Status post LR 1 L bolus at midnight30 on 12/11/23 and LR IVF at 100 ml/hr, received approximately 6 hours before discontinued per MAR, NS 250 ml bolus  Addendum 1445: renal function reviewed and sCr more elevated. NS 500 ml bolus one time. Hold furosemide  40 mg PO daily for 12/13/23. Furosemide  can be resumed for 12/14/23, pending renal function on 12/13/23.   Morbid obesity (HCC) This complicates overall care and prognosis.   OSA on CPAP Continue CPAP qhs  Essential hypertension Home diltiazem  240 mg daily continued  Hypothyroidism Home levothyroxine  25 mcg daily  MGUS (monoclonal gammopathy of unknown significance) Appears at baseline, continue outpatient followup as appropriate  Chronic diastolic (congestive) heart failure (HCC) At baseline  Swelling of left lower extremity Appears trace and at baseline Continue daily furosemide  40 mg daily  Elevated transaminase level Improving  Type 2 diabetes mellitus with stage 4 chronic kidney disease,  with long-term current use of insulin  (HCC) Goal CBG 140-180 Continue home medications and current monitoring  PAD (peripheral artery disease) (HCC) Continue home asa 81 mg daily  Patient BMI: Body mass index is 34.96 kg/m.   DVT prophylaxis: enoxaparin  low dose, subcutaneous Diet:  meals on wheels   Code Status: Full Code   Subjective 12/12/23    At bedside patient  appears to be in no acute distress. Patient is progressing well and endorses improved fatigue, weakness. He continues to have nonproductive cough. He denies chest pain, chest discomfort, abdominal pain, dysuria, hematuria, changes to bowel habits. He reports last bowel movement was yesterday and patient states it was normal.  Disposition Plan & Communication   Dispo & Barriers: continue clinical course Exp d/c date: 12/14/2023 Medically stable for d/c: Not at this time  Family Communication: no, patient states spouse has mild dementia   Consults, Procedures, Significant Events   Consultants:  PT on 12/11/2023  Antimicrobials:  Azithromycin  500 mg PO, to complete 5 doses Ceftriaxone  2 g IV, daily, to complete 5 day course  Significant Events: Patient noted to have tachypnea x 30 minutes at approximately midnight yesterday. Patient was up from chair, straightening his room and per night RN, noted to be doing well, and on dexcom call with spouse with night RN, Veal.  Objective   Vitals:   12/12/23 0845 12/12/23 0900 12/12/23 0928 12/12/23 1600  BP:  (!) 174/92 (!) 174/92 107/65  Pulse: 91 88  90  Resp: (!) 21 18  16   Temp:  97.7 F (36.5 C)  (!) 97.5 F (36.4 C)  TempSrc:  Oral  Oral  SpO2: 95% 96%  99%  Weight:      Height:        Intake/Output Summary (Last 24 hours) at 12/12/2023 1704 Last data filed at 12/11/2023 1839 Gross per 24 hour  Intake 572.5 ml  Output --  Net 572.5 ml   Filed Weights   12/11/23 0151 12/11/23 1452  Weight: 123 kg 120.2 kg   Physical Exam: completed by medic, Lauraine Faes; my location was remote  General exam: awake, alert, no acute distress HEENT: atraumatic, clear conjunctiva, anicteric sclera, moist mucus membranes, hearing grossly normal  Respiratory system: CTAB, no wheezes, rales or rhonchi, normal respiratory effort. Cardiovascular system: normal S1/S2, RRR, no JVD, murmurs, rubs, gallops, left lower extremity trace edema up to mid  shins       Gastrointestinal system: soft, NT, ND, no HSM felt, +bowel sounds. Central nervous system: A&O. no gross focal neurologic deficits, normal speech Extremities: moves all extremities, normal tone Skin: dry, intact, normal temperature, normal color, left lower extremity skin color changes, unchanged from baseline per media picture uploaded Psychiatry: normal mood, congruent affect, judgement and insight appear normal  Labs   Data Reviewed: I have personally reviewed following labs and imaging studies  CBC: Recent Labs  Lab 12/10/23 2327 12/11/23 0438  WBC 9.8 9.3  HGB 11.2* 10.9*  HCT 34.3* 33.5*  MCV 86.2 86.3  PLT 324 317   Basic Metabolic Panel: Recent Labs  Lab 12/10/23 2327 12/11/23 0438 12/11/23 1802 12/12/23 1005  NA 136 136 137 138  K 3.8 3.6 4.1 4.0  CL 101 101 102 103  CO2 20* 24 22 23   GLUCOSE 175* 160* 164* 168*  BUN 75* 66* 61* 65*  CREATININE 2.78* 2.41* 2.47* 2.59*  CALCIUM  10.0 9.7 9.6 9.9   GFR: Estimated Creatinine Clearance: 27.8 mL/min (A) (by C-G formula based on  SCr of 2.59 mg/dL (H)). Liver Function Tests: Recent Labs  Lab 12/10/23 2327 12/11/23 0438 12/11/23 1802 12/12/23 1005  AST 141* 136* 118* 118*  ALT 198* 182* 177* 177*  ALKPHOS 100 93 91 98  BILITOT 0.9 1.0 0.8 0.8  PROT 7.4 6.9 7.1 7.6  ALBUMIN 2.6* 2.3* 2.3* 2.5*   No results for input(s): LIPASE, AMYLASE in the last 168 hours. No results for input(s): AMMONIA in the last 168 hours. Coagulation Profile: Recent Labs  Lab 12/10/23 2327  INR 1.3*   Cardiac Enzymes: Recent Labs  Lab 12/10/23 2327  CKTOTAL 482*   BNP (last 3 results) No results for input(s): PROBNP in the last 8760 hours. HbA1C: No results for input(s): HGBA1C in the last 72 hours. CBG: Recent Labs  Lab 12/11/23 0754  GLUCAP 163*   Lipid Profile: No results for input(s): CHOL, HDL, LDLCALC, TRIG, CHOLHDL, LDLDIRECT in the last 72 hours. Thyroid  Function  Tests: No results for input(s): TSH, T4TOTAL, FREET4, T3FREE, THYROIDAB in the last 72 hours. Anemia Panel: No results for input(s): VITAMINB12, FOLATE, FERRITIN, TIBC, IRON, RETICCTPCT in the last 72 hours. Sepsis Labs: Recent Labs  Lab 12/11/23 0029  LATICACIDVEN 1.2    Recent Results (from the past 240 hours)  Blood Culture (routine x 2)     Status: None (Preliminary result)   Collection Time: 12/10/23 11:33 PM   Specimen: BLOOD  Result Value Ref Range Status   Specimen Description BLOOD SITE NOT SPECIFIED  Final   Special Requests   Final    BOTTLES DRAWN AEROBIC AND ANAEROBIC Blood Culture results may not be optimal due to an inadequate volume of blood received in culture bottles   Culture   Final    NO GROWTH 1 DAY Performed at [facility], [address] [place], [place] [address]    Report Status PENDING  Incomplete  Blood Culture (routine x 2)     Status: None (Preliminary result)   Collection Time: 12/11/23 12:46 AM   Specimen: BLOOD  Result Value Ref Range Status   Specimen Description BLOOD BLOOD RIGHT ARM  Final   Special Requests   Final    BOTTLES DRAWN AEROBIC AND ANAEROBIC Blood Culture adequate volume   Culture   Final    NO GROWTH 1 DAY Performed at [facility], [address] [place], [place] [address]    Report Status PENDING  Incomplete  Resp panel by RT-PCR (RSV, Flu A&B, Covid) Anterior Nasal Swab     Status: None   Collection Time: 12/11/23  1:15 AM   Specimen: Anterior Nasal Swab  Result Value Ref Range Status   SARS Coronavirus 2 by RT PCR NEGATIVE NEGATIVE Final   Influenza A by PCR NEGATIVE NEGATIVE Final   Influenza B by PCR NEGATIVE NEGATIVE Final    Comment: (NOTE) The Xpert Xpress SARS-CoV-2/FLU/RSV plus assay is intended as an aid in the diagnosis of influenza from Nasopharyngeal swab specimens and should not be used as a sole basis for treatment. Nasal washings and aspirates are unacceptable for  Xpert Xpress SARS-CoV-2/FLU/RSV testing.  Fact Sheet for Patients: BloggerCourse.com  Fact Sheet for Healthcare Providers: SeriousBroker.it  This test is not yet approved or cleared by the [place]  FDA and has been authorized for detection and/or diagnosis of SARS-CoV-2 by FDA under an Emergency Use Authorization (EUA). This EUA will remain in effect (meaning this test can be used) for the duration of the COVID-19 declaration under Section 564(b)(1) of  the Act, 21 U.S.C. section 360bbb-3(b)(1), unless the authorization is terminated or revoked.     Resp Syncytial Virus by PCR NEGATIVE NEGATIVE Final    Comment: (NOTE) Fact Sheet for Patients: BloggerCourse.com  Fact Sheet for Healthcare Providers: SeriousBroker.it  This test is not yet approved or cleared by the [place]  FDA and has been authorized for detection and/or diagnosis of SARS-CoV-2 by FDA under an Emergency Use Authorization (EUA). This EUA will remain in effect (meaning this test can be used) for the duration of the COVID-19 declaration under Section 564(b)(1) of the Act, 21 U.S.C. section 360bbb-3(b)(1), unless the authorization is terminated or revoked.  Performed at [facility], [address] [place], [place] [address]       Imaging Studies   DG Chest 2 View Result Date: 12/10/2023 CLINICAL DATA:  Fall weakness and urinary frequency, unable to get out of bed x2 days. EXAM: CHEST - 2 VIEW COMPARISON:  Chest AP Lat 10/21/2020 FINDINGS: Low inspiration on exam with elevated right hemidiaphragm. There is a patchy airspace infiltrate in the right upper lobe consistent with pneumonia in this clinical setting. Rest of the hypoinflated lungs are generally clear apart from linear atelectasis over the elevated right diaphragm. No pleural effusion is seen.  The cardiac size is normal. The mediastinum is  normally outlined, with aortic atherosclerosis. Moderate thoracic spondylosis. IMPRESSION: 1. Patchy airspace infiltrate in the right upper lobe consistent with pneumonia in this clinical setting. Follow-up chest x-ray recommended to ensure resolution. 2. Low inspiration with elevated right hemidiaphragm. 3. Aortic atherosclerosis. Electronically Signed   By: Francis Quam M.D.   On: 12/10/2023 23:24     Medications   Scheduled Meds:  aspirin  EC  81 mg Oral Daily   azithromycin   500 mg Oral Q supper   diltiazem   240 mg Oral Daily   dorzolamide   1 drop Both Eyes BID   enoxaparin  (LOVENOX ) injection  30 mg Subcutaneous Daily   latanoprost   1 drop Both Eyes QHS   levothyroxine   25 mcg Oral Once per day on Monday Tuesday Wednesday Thursday Friday   tamsulosin   0.4 mg Oral Daily   Continuous Infusions:  cefTRIAXone  (ROCEPHIN )  IV 2 g (12/12/23 1640)     LOS: 1 day   Time spent: 65 minutes  Dr. Sherre [facility]  If 7PM-7AM, please contact night-coverage [email or web address] 12/12/2023, 5:04 PM

## 2023-12-12 NOTE — Progress Notes (Signed)
 Only able to obtain CMP at this time, will return this afternoon to collect the CBC. RN and MD made aware.

## 2023-12-12 NOTE — Progress Notes (Signed)
 No additional current health alerts or incoming calls after last contact at 0145. Current health data remained within parameters.

## 2023-12-12 NOTE — Progress Notes (Signed)
 Pt's original IV he was went to with [facility], placed in the ED began leaking as I administered ordered NS Bolus and Rocephin . I removed the IV in LT [facility] as noted under LDA avatar. I attempted IV access 3 more times in the RT [facility], RT wrist and distal to LT [facility], all of which were unsuccessful and infiltrated as soon as I began pushing flush. Pt refuses IV attempts or access in hands. IV team consult ordered and Pt transported via [facility] wheelchair van back to [facility] for IV placement and blood draw.

## 2023-12-12 NOTE — Assessment & Plan Note (Signed)
 Continue CPAP qhs

## 2023-12-12 NOTE — Assessment & Plan Note (Signed)
 Goal CBG 140-180 Continue home medications and current monitoring

## 2023-12-12 NOTE — Hospital Course (Addendum)
 Micheal Walls is an 88 y.o. male with medical history significant for hypertension, type 2 diabetes mellitus, OSA, PAD, CVA, hypothyroidism, chronic HFpEF, and MGUS who presents with fatigue, malaise, loss of appetite, and cough.   Patient began to feel generally poor on 12/08/2023.  He has been mainly staying in bed since then with general malaise and cough.  He has not felt particularly short of breath but has not been very active at all.  He also reports a loss of appetite but denies abdominal pain or vomiting.  He reports that his urine volume seems to have decreased over the past day or 2 but denies dysuria.   ED Course: Upon arrival to the ED, patient is found to be afebrile and saturating mid 90s on room air with tachypnea, mild tachycardia, and stable blood pressure.  Labs are most notable for creatinine 2.78, albumin 2.6, AST 141, ALT 198, normal WBC, and normal lactic acid.  Chest x-ray is concerning for right upper lobe pneumonia.   Blood cultures were collected and the patient was given a liter of LR, Rocephin , and azithromycin .  Covid/influenza A/influenza B/RSV PCR were negative.

## 2023-12-12 NOTE — Assessment & Plan Note (Signed)
 At baseline

## 2023-12-12 NOTE — Assessment & Plan Note (Addendum)
 Home levothyroxine  25 mcg daily

## 2023-12-12 NOTE — Progress Notes (Addendum)
 Per patient request video visit at 0845 for am self administration of medication 2 identifiers complete and synthroid  taken well tolerated educated on best practice to take on empty stomach and wait at minimum 30-40 min before ingesting food or other meds.     9079 Medic/RN video visit - reports left leg edema non-pitting but tighter today did not wear compression hose yesterday. PO lasix  scheduled.and bilateral lower lobes diminished to auscultation. 0930 Provider on visit Pt to take PO lasix  and scheduled meds given by medic well tolerated, some trace edema noted, CMP & CBC pulled today, and DVT prophylaxis changed to Lovenox  Q daily, D/C heparin  Q12. Provider instructed to take sips of fluid during the day.

## 2023-12-12 NOTE — Assessment & Plan Note (Addendum)
 Improving, eGFR is at baseline Continue daily monitoring BMP in the AM Status post LR 1 L bolus at midnight30 on 12/11/23 and LR IVF at 100 ml/hr, received approximately 6 hours before discontinued per MAR, NS 250 ml bolus  Addendum 1445: renal function reviewed and sCr more elevated. NS 500 ml bolus one time. Hold furosemide  40 mg PO daily for 12/13/23. Furosemide  can be resumed for 12/14/23, pending renal function on 12/13/23.

## 2023-12-13 DIAGNOSIS — N178 Other acute kidney failure: Secondary | ICD-10-CM

## 2023-12-13 LAB — CBC
HCT: 30.6 % — ABNORMAL LOW (ref 39.0–52.0)
Hemoglobin: 10.3 g/dL — ABNORMAL LOW (ref 13.0–17.0)
MCH: 28.5 pg (ref 26.0–34.0)
MCHC: 33.7 g/dL (ref 30.0–36.0)
MCV: 84.8 fL (ref 80.0–100.0)
Platelets: 334 10*3/uL (ref 150–400)
RBC: 3.61 MIL/uL — ABNORMAL LOW (ref 4.22–5.81)
RDW: 15.8 % — ABNORMAL HIGH (ref 11.5–15.5)
WBC: 8.7 10*3/uL (ref 4.0–10.5)
nRBC: 0 % (ref 0.0–0.2)

## 2023-12-13 LAB — COMPREHENSIVE METABOLIC PANEL WITH GFR
ALT: 137 U/L — ABNORMAL HIGH (ref 0–44)
AST: 86 U/L — ABNORMAL HIGH (ref 15–41)
Albumin: 2.2 g/dL — ABNORMAL LOW (ref 3.5–5.0)
Alkaline Phosphatase: 87 U/L (ref 38–126)
Anion gap: 13 (ref 5–15)
BUN: 70 mg/dL — ABNORMAL HIGH (ref 8–23)
CO2: 21 mmol/L — ABNORMAL LOW (ref 22–32)
Calcium: 9.8 mg/dL (ref 8.9–10.3)
Chloride: 101 mmol/L (ref 98–111)
Creatinine, Ser: 2.57 mg/dL — ABNORMAL HIGH (ref 0.61–1.24)
GFR, Estimated: 24 mL/min — ABNORMAL LOW (ref >60)
Glucose, Bld: 175 mg/dL — ABNORMAL HIGH (ref 70–99)
Potassium: 3.7 mmol/L (ref 3.5–5.1)
Sodium: 135 mmol/L (ref 135–145)
Total Bilirubin: 0.5 mg/dL (ref 0.0–1.2)
Total Protein: 6.5 g/dL (ref 6.5–8.1)

## 2023-12-13 LAB — LEGIONELLA PNEUMOPHILA SEROGP 1 UR AG: L. pneumophila Serogp 1 Ur Ag: NEGATIVE

## 2023-12-13 LAB — BRAIN NATRIURETIC PEPTIDE
B Natriuretic Peptide: 48 pg/mL (ref 0.0–100.0)
B Natriuretic Peptide: 60.7 pg/mL (ref 0.0–100.0)

## 2023-12-13 LAB — PREALBUMIN: Prealbumin: 7 mg/dL — ABNORMAL LOW (ref 18–38)

## 2023-12-13 MED ORDER — ALBUMIN HUMAN 25 % IV SOLN
25.0000 g | Freq: Once | INTRAVENOUS | Status: AC
  Administered 2023-12-13: 25 g via INTRAVENOUS
  Filled 2023-12-13: qty 100

## 2023-12-13 MED ORDER — SODIUM CHLORIDE 0.9 % IV BOLUS
1000.0000 mL | Freq: Once | INTRAVENOUS | Status: AC
  Administered 2023-12-13: 500 mL via INTRAVENOUS

## 2023-12-13 MED ORDER — GUAIFENESIN 100 MG/5ML PO LIQD
10.0000 mL | Freq: Three times a day (TID) | ORAL | Status: DC | PRN
  Administered 2023-12-13: 10 mL via ORAL
  Filled 2023-12-13 (×7): qty 10

## 2023-12-13 MED ORDER — ALBUMIN HUMAN 25 % IV SOLN
50.0000 g | Freq: Once | INTRAVENOUS | Status: DC
  Filled 2023-12-13: qty 200

## 2023-12-13 MED ORDER — DILTIAZEM HCL ER COATED BEADS 180 MG PO CP24
180.0000 mg | ORAL_CAPSULE | Freq: Every day | ORAL | Status: DC
  Administered 2023-12-14 – 2023-12-17 (×4): 180 mg via ORAL
  Filled 2023-12-13 (×6): qty 1

## 2023-12-13 MED ORDER — ALBUMIN HUMAN 25 % IV SOLN
50.0000 g | Freq: Once | INTRAVENOUS | Status: AC
  Administered 2023-12-14: 50 g via INTRAVENOUS
  Filled 2023-12-13: qty 200

## 2023-12-13 NOTE — Plan of Care (Signed)
 Patient alert and agreeable at times unsure if patient is understanding contextually.  Rocephin , albumin, and IV fluids provided today to support renal function and blood pressure. Eye doctor appointment and transport appointment for the morning are cancelled. Eyecare MD office number [phone] to confirm in the am cancellation was received. Albumin 50g at 60mL/hr is scheduled for morning visit. Patient eating meals we provide. Nutritional consult placed.  Problem: Education: Goal: Ability to describe self-care measures that may prevent or decrease complications (Diabetes Survival Skills Education) will improve Outcome: Progressing   Problem: Metabolic: Goal: Ability to maintain appropriate glucose levels will improve Outcome: Progressing   Problem: Nutritional: Goal: Maintenance of adequate nutrition will improve Outcome: Progressing   Problem: Activity: Goal: Ability to tolerate increased activity will improve Outcome: Progressing   Problem: Respiratory: Goal: Ability to maintain adequate ventilation will improve Description: Patient provided PRN Neb treatment and spirometer use to lossen secretions Outcome: Progressing

## 2023-12-13 NOTE — Progress Notes (Addendum)
 0800 Patient still resting requested I call back for visit in an hour. Denies pain, chest pain, or shortness of breath. Called back video visit patient reports feeling well and that left leg swelling appears reduced. Identification complete took self administered medications, EMT now transporting to lab for blood work.

## 2023-12-13 NOTE — Progress Notes (Signed)
 Video call made to patient and RN was able to introduce herself and talk with patient. Patient noted he had drifted off to sleep and didn't hear phone call. Patient opted to to do night time medications while on video call. No complaints and current health data WNL. Blood sugar was 98 on patient's Dexcom meter. Stated that he did not get a chance to write any of his previous readings for the day down. RN encouraged patient to eat a snack before bedtime and patient agreed to. Patient mentioned wanting to take a shower tonight but RN requested that he try washing up to avoid getting arm monitor and new IV wet. Patient stated that he would try.  Patient reminded to call RN at any time during the night if needed.

## 2023-12-13 NOTE — Progress Notes (Signed)
 Phone call x2 made to patient's contact number. Mailbox is full at this time and RN unable to leave voicemail. Will try to reach out again shortly.

## 2023-12-13 NOTE — Progress Notes (Signed)
 No additional night current health alerts or incoming calls for RN support on shift. Patient remained stable.

## 2023-12-13 NOTE — Progress Notes (Signed)
 1300 Video visit with provider identification completed. Patient alert and oriented and back from lab. Orders are continue antibiotics, albumin, monitor blood pressure. Patient will have albumin then 500 mL NS to be infused. In morning diuresis. VS 108/68, p80, O2 sat 97%.Order for robitussin, PRN neb treatment and steam to loosen secretions. Patient agrees with plan. Meal provided.

## 2023-12-13 NOTE — Progress Notes (Signed)
 PROGRESS NOTE    [name]  FMW:[id number]  DOB: 09/09/1935   PCP: [name], [name]    DOA: 12/10/2023 LOS: 2    Subjective 12/13/23    Virtual visit: The patient was seen and examined virtually.  Paramedic Ms. Lauraine Dow was accompanying the patient.  The patient identified himself as Micheal Walls, with date of birth of June 11, 1936.  Patient was complaining of dry cough, mild shortness of breath, denies any chest pain Report of no event overnight Reported improved leg swelling.  Paramedic at patient's side on a virtual visit and RN Ms. Darice Hench present on a virtual visit.   Brief Narrative   Micheal Walls is an 88 y.o. male with medical history significant for hypertension, type 2 diabetes mellitus, OSA, PAD, CVA, hypothyroidism, chronic HFpEF, and MGUS who presents with fatigue, malaise, loss of appetite, and cough.   Patient began to feel generally poor on 12/08/2023.  He has been mainly staying in bed since then with general malaise and cough.  He has not felt particularly short of breath but has not been very active at all.  He also reports a loss of appetite but denies abdominal pain or vomiting.  He reports that his urine volume seems to have decreased over the past day or 2 but denies dysuria.   ED Course: Upon arrival to the ED, patient is found to be afebrile and saturating mid 90s on room air with tachypnea, mild tachycardia, and stable blood pressure.  Labs are most notable for creatinine 2.78, albumin 2.6, AST 141, ALT 198, normal WBC, and normal lactic acid.  Chest x-ray is concerning for right upper lobe pneumonia.   Blood cultures were collected and the patient was given a liter of LR, Rocephin , and azithromycin .  Covid/influenza A/influenza B/RSV PCR were negative.  Assessment & Plan   Principal Problem:   PNA (pneumonia) Active Problems:   Essential hypertension   OSA on CPAP   Morbid obesity (HCC)   History of stroke   Acute  renal failure superimposed on stage 4 chronic kidney disease (HCC)   Chronic diastolic (congestive) heart failure (HCC)   MGUS (monoclonal gammopathy of unknown significance)   Hypothyroidism   S/P unilateral BKA (below knee amputation) (HCC)   PAD (peripheral artery disease) (HCC)   Type 2 diabetes mellitus with stage 4 chronic kidney disease, with long-term current use of insulin  (HCC)   Elevated transaminase level   Swelling of left lower extremity  Assessment and Plan:   * PNA (pneumonia)/right lower lobe pneumonia -Hemodynamically stable, satting 96% on room air -Still complaining of generalized weakness, dry cough POA: weakness, fatigue and cough on initial presentation with noted RUL pneumonia on imaging 12/10/22  Cough remains nonproductive  PSI score 4  -Blood cultures-no growth to date x 2 days -Continue IV antibiotics of Rocephin , p.o. azithromycin  day 4 today of ABX Remained afebrile, mildly hypotensive, with no leukocytosis Continue to monitor clinical course   Acute renal failure superimposed on stage 4 chronic kidney disease (HCC) Improving, eGFR is at baseline Continue daily monitoring BMP in the AM Status post LR 1 L bolus at midnight 30 on 12/11/23 and LR IVF at 100 ml/hr, received another NS 250 ml bolus -Monitor BUN/creatinine -We will initiate normal saline 125 mL/h for total of 1 L  Lab Results  Component Value Date   CREATININE 2.57 (H) 12/13/2023   CREATININE 2.59 (H) 12/12/2023   CREATININE 2.47 (H) 12/11/2023   -Continue hold diuretics  Hypoalbuminemia: Initiating 25 g of IV albumin    Morbid obesity (HCC) This complicates overall care and prognosis.    OSA on CPAP Continue CPAP qhs   Essential hypertension -Blood pressures running soft Home diltiazem  240 mg daily continued -reducing dose due to hypotension -Holding diuretics   Hypothyroidism Home levothyroxine  25 mcg daily   MGUS (monoclonal gammopathy of unknown significance) Appears  at baseline, continue outpatient followup as appropriate   Chronic diastolic (congestive) heart failure (HCC) At baseline -Monitoring with IV fluid -Last echo 07/19/2020: EF 55-60% moderate left ventricular hypertrophy.  Indeterminate diastolic filling due  to E-A fusion.  -Monitoring daily weight difficult to do so due to his prosthesis, severe debility - BNP, 48,>>>  60.7,  Swelling of left lower extremity Appears trace and at baseline Holding - furosemide  40 mg daily   Elevated transaminase level Improving   Type 2 diabetes mellitus with stage 4 chronic kidney disease, with long-term current use of insulin  (HCC) Goal CBG 140-180 Continue home medications and current monitoring   PAD (peripheral artery disease) (HCC) Continue home asa 81 mg daily   Patient BMI: Body mass index is 34.96 kg/m.     Disposition Plan & Communication   Dispo & Barriers: continue clinical course Exp d/c date: 12/15/2023 Medically stable for d/c: Not at this time  Family Communication: no, patient states spouse has mild dementia   Consults, Procedures, Significant Events   Consultants:  PT on 12/11/2023  Antimicrobials:  Azithromycin  500 mg PO, to complete 5 doses Ceftriaxone  2 g IV, daily, to complete 5 day course  Significant Events: Complaining of cough, breath  Objective   Vitals:   12/12/23 0928 12/12/23 1600 12/13/23 1100 12/13/23 1300  BP: (!) 174/92 107/65 132/67 108/68  Pulse:  90  82  Resp:  16  16  Temp:  (!) 97.5 F (36.4 C)  97.9 F (36.6 C)  TempSrc:  Oral  Oral  SpO2:  99%  96%  Weight:      Height:       No intake or output data in the 24 hours ending 12/13/23 1336  Filed Weights   12/11/23 0151 12/11/23 1452  Weight: 123 kg 120.2 kg   Physical exam visualized, performed by paramedic Lauraine Faes     General:  AAO x 3,  cooperative, no distress;   HEENT:  Normocephalic, PERRL, otherwise with in Normal limits   Neuro:  CNII-XII intact. , normal motor and  sensation, reflexes intact   Lungs:   Clear to auscultation BL, Respirations unlabored,  No wheezes / crackles  Cardio:    S1/S2, RRR, No murmure, No Rubs or Gallops   Abdomen:  Soft, non-tender, bowel sounds active all four quadrants, no guarding or peritoneal signs.  Muscular  skeletal:  Right leg prosthesis Limited exam -global generalized weaknesses - in bed, able to move all 4 extremities,   2+ pulses,  symmetric, left leg +1  pitting edema Please pictures below  Skin:  Dry, warm to touch, negative for any Rashes,  Wounds: Please see nursing documentation                 Labs   Data Reviewed: I have personally reviewed following labs and imaging studies  CBC: Recent Labs  Lab 12/10/23 2327 12/11/23 0438 12/12/23 2109 12/13/23 1200  WBC 9.8 9.3 8.3 8.7  HGB 11.2* 10.9* 9.9* 10.3*  HCT 34.3* 33.5* 29.6* 30.6*  MCV 86.2 86.3 86.8 84.8  PLT 324 317 295 334  Basic Metabolic Panel: Recent Labs  Lab 12/10/23 2327 12/11/23 0438 12/11/23 1802 12/12/23 1005 12/13/23 1200  NA 136 136 137 138 135  K 3.8 3.6 4.1 4.0 3.7  CL 101 101 102 103 101  CO2 20* 24 22 23  21*  GLUCOSE 175* 160* 164* 168* 175*  BUN 75* 66* 61* 65* 70*  CREATININE 2.78* 2.41* 2.47* 2.59* 2.57*  CALCIUM  10.0 9.7 9.6 9.9 9.8   GFR: Estimated Creatinine Clearance: 28 mL/min (A) (by C-G formula based on SCr of 2.57 mg/dL (H)). Liver Function Tests: Recent Labs  Lab 12/10/23 2327 12/11/23 0438 12/11/23 1802 12/12/23 1005 12/13/23 1200  AST 141* 136* 118* 118* 86*  ALT 198* 182* 177* 177* 137*  ALKPHOS 100 93 91 98 87  BILITOT 0.9 1.0 0.8 0.8 0.5  PROT 7.4 6.9 7.1 7.6 6.5  ALBUMIN 2.6* 2.3* 2.3* 2.5* 2.2*   No results for input(s): LIPASE, AMYLASE in the last 168 hours. No results for input(s): AMMONIA in the last 168 hours. Coagulation Profile: Recent Labs  Lab 12/10/23 2327  INR 1.3*   Cardiac Enzymes: Recent Labs  Lab 12/10/23 2327  CKTOTAL 482*     CBG: Recent Labs  Lab 12/11/23 0754  GLUCAP 163*   L Sepsis Labs: Recent Labs  Lab 12/11/23 0029  LATICACIDVEN 1.2    Recent Results (from the past 240 hours)  Blood Culture (routine x 2)     Status: None (Preliminary result)   Collection Time: 12/10/23 11:33 PM   Specimen: BLOOD  Result Value Ref Range Status   Specimen Description BLOOD SITE NOT SPECIFIED  Final   Special Requests   Final    BOTTLES DRAWN AEROBIC AND ANAEROBIC Blood Culture results may not be optimal due to an inadequate volume of blood received in culture bottles   Culture   Final    NO GROWTH 2 DAYS Performed at [facility], [address], [place], [place] [address]    Report Status PENDING  Incomplete  Blood Culture (routine x 2)     Status: None (Preliminary result)   Collection Time: 12/11/23 12:46 AM   Specimen: BLOOD  Result Value Ref Range Status   Specimen Description BLOOD BLOOD RIGHT ARM  Final   Special Requests   Final    BOTTLES DRAWN AEROBIC AND ANAEROBIC Blood Culture adequate volume   Culture   Final    NO GROWTH 2 DAYS Performed at [facility], [address] [place], [place] [address]    Report Status PENDING  Incomplete  Resp panel by RT-PCR (RSV, Flu A&B, Covid) Anterior Nasal Swab     Status: None   Collection Time: 12/11/23  1:15 AM   Specimen: Anterior Nasal Swab  Result Value Ref Range Status   SARS Coronavirus 2 by RT PCR NEGATIVE NEGATIVE Final   Influenza A by PCR NEGATIVE NEGATIVE Final   Influenza B by PCR NEGATIVE NEGATIVE Final    Comment: (NOTE) The Xpert Xpress SARS-CoV-2/FLU/RSV plus assay is intended as an aid in the diagnosis of influenza from Nasopharyngeal swab specimens and should not be used as a sole basis for treatment. Nasal washings and aspirates are unacceptable for Xpert Xpress SARS-CoV-2/FLU/RSV testing.  Fact Sheet for Patients: BloggerCourse.com  Fact Sheet for Healthcare  Providers: SeriousBroker.it  This test is not yet approved or cleared by the [place]  FDA and has been authorized for detection and/or diagnosis of SARS-CoV-2 by FDA under an Emergency Use Authorization (EUA). This EUA  will remain in effect (meaning this test can be used) for the duration of the COVID-19 declaration under Section 564(b)(1) of the Act, 21 U.S.C. section 360bbb-3(b)(1), unless the authorization is terminated or revoked.     Resp Syncytial Virus by PCR NEGATIVE NEGATIVE Final    Comment: (NOTE) Fact Sheet for Patients: BloggerCourse.com  Fact Sheet for Healthcare Providers: SeriousBroker.it  This test is not yet approved or cleared by the [place]  FDA and has been authorized for detection and/or diagnosis of SARS-CoV-2 by FDA under an Emergency Use Authorization (EUA). This EUA will remain in effect (meaning this test can be used) for the duration of the COVID-19 declaration under Section 564(b)(1) of the Act, 21 U.S.C. section 360bbb-3(b)(1), unless the authorization is terminated or revoked.  Performed at [facility], [address] [place], [place] [address]       Imaging Studies   No results found.    Medications   Scheduled Meds:  aspirin  EC  81 mg Oral Daily   azithromycin   500 mg Oral Q supper   diltiazem   240 mg Oral Daily   dorzolamide   1 drop Both Eyes BID   enoxaparin  (LOVENOX ) injection  30 mg Subcutaneous Daily   latanoprost   1 drop Both Eyes QHS   levothyroxine   25 mcg Oral Once per day on Monday Tuesday Wednesday Thursday Friday   tamsulosin   0.4 mg Oral Daily   Continuous Infusions:  cefTRIAXone  (ROCEPHIN )  IV Stopped (12/12/23 2158)   sodium chloride        LOS: 2 days   Time spent: 65 minutes  SIGNED: Adriana DELENA Grams, MD, FHM. FAAFP [facility],  Pager (please use [email or web address] to page/text)  Please use Epic Secure Chat for  non-urgent communication (7AM-7PM) If 7PM-7AM, please contact night-coverage [email or web address],  12/13/2023, 1:36 PM

## 2023-12-14 MED ORDER — FUROSEMIDE 40 MG PO TABS
40.0000 mg | ORAL_TABLET | Freq: Two times a day (BID) | ORAL | Status: DC
  Administered 2023-12-14 – 2023-12-17 (×8): 40 mg via ORAL
  Filled 2023-12-14 (×9): qty 1

## 2023-12-14 MED ORDER — ADULT MULTIVITAMIN W/MINERALS CH
1.0000 | ORAL_TABLET | Freq: Every day | ORAL | Status: DC
  Administered 2023-12-15 – 2023-12-17 (×3): 1 via ORAL
  Filled 2023-12-14 (×4): qty 1

## 2023-12-14 NOTE — Plan of Care (Signed)
 0800 Phone call to patient's optometrist's office to confirm cancellation of AM appt.  0825 Video call made with patient for AM meds. Still unable to find wearable device.  1215 Call to wife to report ETA of paramedic.   [id number] Video call through [employer] device with patient, family, paramedic Micheal Walls, Dr Willette, physical therapist Leontine, and myself. Reports no pain. Discussed nutrition, meds, prognosis, improving cough. Paramedic able to find wearable and replaced. Urology appointment made with assistance of paramedic. Albumin infusion started. Patient and granddaughter Micheal Walls instructed to call virtual nurse to stop infusion and how to clamp IV. Return demonstration done by granddaughter.   1440 Request for camera visit placed on patient's [employer] app; Video call with family, who stated that the IV was beeping. Granddaughter Micheal Walls correctly rolled the clamp shut and turned off the IV pump off. Witnessed patient taking lasix  as prescribed. Asked patient and family to call the virtual RN hub with any further questions.

## 2023-12-14 NOTE — Progress Notes (Signed)
 Patient currently not showing vitals in current health. It appears patient has taken armband off. Several phone calls and video attempts made to contact patient without answer. Day RN aware.

## 2023-12-14 NOTE — Plan of Care (Signed)
  Problem: Coping: Goal: Ability to adjust to condition or change in health will improve Outcome: Progressing   Problem: Health Behavior/Discharge Planning: Goal: Ability to manage health-related needs will improve Outcome: Progressing   Problem: Metabolic: Goal: Ability to maintain appropriate glucose levels will improve Outcome: Progressing   Problem: Nutritional: Goal: Maintenance of adequate nutrition will improve Outcome: Progressing   Problem: Respiratory: Goal: Ability to maintain adequate ventilation will improve Description: Patient provided PRN Neb treatment and spirometer use to lossen secretions Outcome: Progressing

## 2023-12-14 NOTE — TOC Progression Note (Signed)
 Transition of Care [facility]) - Progression Note    Patient Details  Name: Micheal Walls MRN: [id number] Date of Birth: 04-10-1936  [facility]) CM/SW Contact  Debarah Saunas, RN Phone Number: 12/14/2023, 8:07 AM  Clinical Narrative:     RNCM following for discharge needs. Jonaya Freshour J. Debarah, BSN,  RN, [facility]TA[facility] [phone]   Expected Discharge Plan: Home w Home Health Services ([facility]) Barriers to Discharge: Continued Medical Work up  Expected Discharge Plan and Services In-house Referral: PCP / Health Connect   Post Acute Care Choice: Home Health Living arrangements for the past 2 months: Single Family Home                           HH Arranged: Refused HH           Social Determinants of Health (SDOH) Interventions SDOH Screenings   Food Insecurity: No Food Insecurity (12/11/2023)  Housing: High Risk (12/11/2023)  Transportation Needs: Unmet Transportation Needs (12/11/2023)  Utilities: Not At Risk (12/11/2023)  Alcohol  Screen: Low Risk  (06/02/2023)  Depression (PHQ2-9): Low Risk  (06/02/2023)  Financial Resource Strain: Low Risk  (08/20/2023)  Physical Activity: Inactive (12/11/2023)  Social Connections: Moderately Integrated (12/11/2023)  Stress: No Stress Concern Present (08/20/2023)  Tobacco Use: Medium Risk (12/12/2023)  Health Literacy: Adequate Health Literacy (12/11/2023)    Readmission Risk Interventions    12/11/2023   11:10 AM  Readmission Risk Prevention Plan  Transportation Screening Complete  PCP or Specialist Appt within 5-7 Days Complete  Home Care Screening Complete  Medication Review (RN CM) Complete

## 2023-12-14 NOTE — Progress Notes (Signed)
 PROGRESS NOTE    Micheal Walls  FMW:[id number]  DOB: November 28, 1935   PCP: Micheal Speaks, [name]    DOA: 12/10/2023 LOS: 3    Subjective 12/13/23    Virtual visit: The patient was seen and examined virtually.  Paramedic Ms. Micheal Walls was present at bedside and RN Micheal Walls was present on video chat simultaneously The patient identified himself as Micheal Walls, with date of birth of 1935/12/30.  [name]  was awake alert oriented no acute distress reporting improved cough, was not complaining of shortness of breath or chest pain. Only complaint mild swelling in lower extremity. Hemodynamically stable, no issues overnight     Brief Narrative   Micheal Walls is an 88 y.o. male with medical history significant for hypertension, type 2 diabetes mellitus, OSA, PAD, CVA, hypothyroidism, chronic HFpEF, and MGUS who presents with fatigue, malaise, loss of appetite, and cough.   Patient began to feel generally poor on 12/08/2023.  He has been mainly staying in bed since then with general malaise and cough.  He has not felt particularly short of breath but has not been very active at all.  He also reports a loss of appetite but denies abdominal pain or vomiting.  He reports that his urine volume seems to have decreased over the past day or 2 but denies dysuria.   ED Course: Upon arrival to the ED, patient is found to be afebrile and saturating mid 90s on room air with tachypnea, mild tachycardia, and stable blood pressure.  Labs are most notable for creatinine 2.78, albumin 2.6, AST 141, ALT 198, normal WBC, and normal lactic acid.  Chest x-ray is concerning for right upper lobe pneumonia.   Blood cultures were collected and the patient was given a liter of LR, Rocephin , and azithromycin .  Covid/influenza A/influenza B/RSV PCR were negative.  Assessment & Plan   Principal Problem:   PNA (pneumonia) Active Problems:   Essential hypertension   OSA on CPAP   Morbid  obesity (HCC)   History of stroke   Acute renal failure superimposed on stage 4 chronic kidney disease (HCC)   Chronic diastolic (congestive) heart failure (HCC)   MGUS (monoclonal gammopathy of unknown significance)   Hypothyroidism   S/P unilateral BKA (below knee amputation) (HCC)   PAD (peripheral artery disease) (HCC)   Type 2 diabetes mellitus with stage 4 chronic kidney disease, with long-term current use of insulin  (HCC)   Elevated transaminase level   Swelling of left lower extremity  Assessment and Plan:   * PNA (pneumonia)/right lower lobe pneumonia -Improved shortness of breath, cough, hemodynamically stable, afebrile, normotensive, satting 97% on room air - Improved cough with current medication  POA: weakness, fatigue and cough on initial presentation with noted RUL pneumonia on imaging 12/10/22  Cough remains nonproductive  PSI score 4  -Blood cultures-no growth to date -Continue IV antibiotics of Rocephin , p.o. azithromycin  -last antibiotics 12/15/2023 (Would likely discontinue antibiotics after 5 days of treatment with good improvement)  Remained afebrile, mildly hypotensive, with no leukocytosis Continue to monitor clinical course   Acute renal failure superimposed on stage 4 chronic kidney disease (HCC) Worsening  eGFR  - S/p IV fluid resuscitation: Since admission: Status post LR 1 L bolus at midnight 30 on 12/11/23 and LR IVF at 100 ml/hr, received another NS 250 ml bolus + 500 mL normal saline 1 12/13/2023 -Discontinued all IVF -Monitor BUN/creatinine  Lab Results  Component Value Date   CREATININE 2.57 (H) 12/13/2023  CREATININE 2.59 (H) 12/12/2023   CREATININE 2.47 (H) 12/11/2023   - Resuming p.o. Lasix   Hypoalbuminemia: Initiating 25 g of IV albumin+ 50 g IV albumin today followed by 40 mg of Lasix     Morbid obesity (HCC) This complicates overall care and prognosis.    OSA on CPAP Continue CPAP qhs   Essential hypertension -Blood pressures  running soft Home diltiazem  240 mg daily continued -reducing dose due to hypotension - Lasix  today due to increasing lower extremity edema   Hypothyroidism - Home levothyroxine  25 mcg daily   MGUS (monoclonal gammopathy of unknown significance) Appears at baseline, continue outpatient followup as appropriate   Chronic diastolic (congestive) heart failure (HCC) At baseline -S/p gentle IV fluid hydration -Last echo 07/19/2020: EF 55-60% moderate left ventricular hypertrophy.  Indeterminate diastolic filling due  to E-A fusion.  -Monitoring daily weight difficult to do so due to his prosthesis, severe debility - BNP, 48,>>>  60.7, - Developing mild lower extremity edema, resuming home diuretics   Swelling of left lower extremity Appears trace and at baseline Resuming- furosemide  40 mg twice daily   Elevated transaminase level Improving   Type 2 diabetes mellitus with stage 4 chronic kidney disease, with long-term current use of insulin  (HCC) Goal CBG 140-180 Continue home medications and current monitoring   PAD (peripheral artery disease) (HCC) Continue home asa 81 mg daily   Patient BMI: Body mass index is 34.96 kg/m.     Disposition Plan & Communication   Dispo & Barriers: continue clinical course Exp d/c date: 12/15/2023 Medically stable for d/c: Not at this time  Family Communication: no, patient states spouse has mild dementia   Consults, Procedures, Significant Events   Consultants:  PT on 12/11/2023  Antimicrobials:  Azithromycin  500 mg PO, to complete 5 doses Ceftriaxone  2 g IV, daily, to complete 5 day course  Significant Events: Complaining of cough, breath  Objective   Vitals:   12/12/23 1600 12/13/23 1100 12/13/23 1300 12/13/23 1700  BP: 107/65 132/67 108/68 109/61  Pulse: 90  82 70  Resp: 16  16 18   Temp: (!) 97.5 F (36.4 C)  97.9 F (36.6 C) 97.7 F (36.5 C)  TempSrc: Oral  Oral Oral  SpO2: 99%  96% 97%  Weight:      Height:         Intake/Output Summary (Last 24 hours) at 12/14/2023 1307 Last data filed at 12/13/2023 1830 Gross per 24 hour  Intake 650 ml  Output --  Net 650 ml    Filed Weights   12/11/23 0151 12/11/23 1452  Weight: 123 kg 120.2 kg   Physical exam visualized, performed by paramedic Lauraine Faes         General:  AAO x 3,  cooperative, no distress;   HEENT:  Normocephalic, PERRL, otherwise with in Normal limits   Neuro:  CNII-XII intact. , normal motor and sensation, reflexes intact   Lungs:   Clear to auscultation BL, Respirations unlabored,  No wheezes / crackles  Cardio:    S1/S2, RRR, No murmure, No Rubs or Gallops   Abdomen:  Soft, non-tender, bowel sounds active all four quadrants, no guarding or peritoneal signs.  Muscular  skeletal:  Right leg prosthesis  Limited mobility, electric wheelchair bound  Limited exam -global generalized weaknesses - in bed, able to move all 4 extremities,   2+ pulses,  symmetric, + pitting edema  Skin:  Dry, warm to touch, negative for any Rashes,  Wounds: Please see nursing documentation  Labs   Data Reviewed: I have personally reviewed following labs and imaging studies  CBC: Recent Labs  Lab 12/10/23 2327 12/11/23 0438 12/12/23 2109 12/13/23 1200  WBC 9.8 9.3 8.3 8.7  HGB 11.2* 10.9* 9.9* 10.3*  HCT 34.3* 33.5* 29.6* 30.6*  MCV 86.2 86.3 86.8 84.8  PLT 324 317 295 334   Basic Metabolic Panel: Recent Labs  Lab 12/10/23 2327 12/11/23 0438 12/11/23 1802 12/12/23 1005 12/13/23 1200  NA 136 136 137 138 135  K 3.8 3.6 4.1 4.0 3.7  CL 101 101 102 103 101  CO2 20* 24 22 23  21*  GLUCOSE 175* 160* 164* 168* 175*  BUN 75* 66* 61* 65* 70*  CREATININE 2.78* 2.41* 2.47* 2.59* 2.57*  CALCIUM  10.0 9.7 9.6 9.9 9.8   GFR: Estimated Creatinine Clearance: 28 mL/min (A) (by C-G formula based on SCr of 2.57 mg/dL (H)). Liver Function Tests: Recent Labs  Lab 12/10/23 2327 12/11/23 0438 12/11/23 1802  12/12/23 1005 12/13/23 1200  AST 141* 136* 118* 118* 86*  ALT 198* 182* 177* 177* 137*  ALKPHOS 100 93 91 98 87  BILITOT 0.9 1.0 0.8 0.8 0.5  PROT 7.4 6.9 7.1 7.6 6.5  ALBUMIN 2.6* 2.3* 2.3* 2.5* 2.2*   No results for input(s): LIPASE, AMYLASE in the last 168 hours. No results for input(s): AMMONIA in the last 168 hours. Coagulation Profile: Recent Labs  Lab 12/10/23 2327  INR 1.3*   Cardiac Enzymes: Recent Labs  Lab 12/10/23 2327  CKTOTAL 482*    CBG: Recent Labs  Lab 12/11/23 0754  GLUCAP 163*   L Sepsis Labs: Recent Labs  Lab 12/11/23 0029  LATICACIDVEN 1.2    Recent Results (from the past 240 hours)  Blood Culture (routine x 2)     Status: None (Preliminary result)   Collection Time: 12/10/23 11:33 PM   Specimen: BLOOD  Result Value Ref Range Status   Specimen Description BLOOD SITE NOT SPECIFIED  Final   Special Requests   Final    BOTTLES DRAWN AEROBIC AND ANAEROBIC Blood Culture results may not be optimal due to an inadequate volume of blood received in culture bottles   Culture   Final    NO GROWTH 3 DAYS Performed at [facility], [address], [place], [place] [address]    Report Status PENDING  Incomplete  Blood Culture (routine x 2)     Status: None (Preliminary result)   Collection Time: 12/11/23 12:46 AM   Specimen: BLOOD  Result Value Ref Range Status   Specimen Description BLOOD BLOOD RIGHT ARM  Final   Special Requests   Final    BOTTLES DRAWN AEROBIC AND ANAEROBIC Blood Culture adequate volume   Culture   Final    NO GROWTH 3 DAYS Performed at [facility], [address] [place], [place] [address]    Report Status PENDING  Incomplete  Resp panel by RT-PCR (RSV, Flu A&B, Covid) Anterior Nasal Swab     Status: None   Collection Time: 12/11/23  1:15 AM   Specimen: Anterior Nasal Swab  Result Value Ref Range Status   SARS Coronavirus 2 by RT PCR NEGATIVE NEGATIVE Final   Influenza A by PCR NEGATIVE NEGATIVE Final    Influenza B by PCR NEGATIVE NEGATIVE Final    Comment: (NOTE) The Xpert Xpress SARS-CoV-2/FLU/RSV plus assay is intended as an aid in the diagnosis of influenza from Nasopharyngeal swab specimens and should not be used as a sole basis  for treatment. Nasal washings and aspirates are unacceptable for Xpert Xpress SARS-CoV-2/FLU/RSV testing.  Fact Sheet for Patients: BloggerCourse.com  Fact Sheet for Healthcare Providers: SeriousBroker.it  This test is not yet approved or cleared by the [place]  FDA and has been authorized for detection and/or diagnosis of SARS-CoV-2 by FDA under an Emergency Use Authorization (EUA). This EUA will remain in effect (meaning this test can be used) for the duration of the COVID-19 declaration under Section 564(b)(1) of the Act, 21 U.S.C. section 360bbb-3(b)(1), unless the authorization is terminated or revoked.     Resp Syncytial Virus by PCR NEGATIVE NEGATIVE Final    Comment: (NOTE) Fact Sheet for Patients: BloggerCourse.com  Fact Sheet for Healthcare Providers: SeriousBroker.it  This test is not yet approved or cleared by the [place]  FDA and has been authorized for detection and/or diagnosis of SARS-CoV-2 by FDA under an Emergency Use Authorization (EUA). This EUA will remain in effect (meaning this test can be used) for the duration of the COVID-19 declaration under Section 564(b)(1) of the Act, 21 U.S.C. section 360bbb-3(b)(1), unless the authorization is terminated or revoked.  Performed at [facility], [address], [place], [place] [address]       Imaging Studies   No results found.    Medications   Scheduled Meds:  aspirin  EC  81 mg Oral Daily   azithromycin   500 mg Oral Q supper   diltiazem   180 mg Oral Daily   dorzolamide   1 drop Both Eyes BID   enoxaparin  (LOVENOX ) injection  30 mg Subcutaneous Daily    furosemide   40 mg Oral BID   latanoprost   1 drop Both Eyes QHS   levothyroxine   25 mcg Oral Once per day on Monday Tuesday Wednesday Thursday Friday   tamsulosin   0.4 mg Oral Daily   Continuous Infusions:  albumin human     cefTRIAXone  (ROCEPHIN )  IV Stopped (12/13/23 1830)     LOS: 3 days   Time spent: 65 minutes  SIGNED: Adriana DELENA Grams, MD, FHM. FAAFP [facility],  Pager (please use [email or web address] to page/text)  Please use Epic Secure Chat for non-urgent communication (7AM-7PM) If 7PM-7AM, please contact night-coverage [email or web address],  12/14/2023, 1:07 PM

## 2023-12-14 NOTE — Plan of Care (Signed)
  Problem: Health Behavior/Discharge Planning: Goal: Ability to manage health-related needs will improve Outcome: Progressing   Problem: Metabolic: Goal: Ability to maintain appropriate glucose levels will improve Outcome: Progressing   Problem: Respiratory: Goal: Ability to maintain adequate ventilation will improve Description: Patient provided PRN Neb treatment and spirometer use to lossen secretions Outcome: Progressing Goal: Ability to maintain a clear airway will improve Outcome: Progressing

## 2023-12-14 NOTE — Progress Notes (Signed)
 Administered PM medications as prescribed without incident. Spoke with his daughter on the phone and explained what was going on and the plan the physician has with the expected discharge date being 12/16/23. Paramedic [place] spoke with [facility] and confirmed that PT has an appointment on 02/01/24 at 0900. Family stated that they knew about this and were fine with it.   PT was reminded that if he had any questions to call the RN.  Family was at the home with paramedic [facility] left.

## 2023-12-14 NOTE — Progress Notes (Signed)
 Initial Nutrition Assessment  DOCUMENTATION CODES:   Not applicable  INTERVENTION:   Heart healthy, carbohydrate modified diet education provided. Add MVI with minerals daily.  NUTRITION DIAGNOSIS:   Food and nutrition related knowledge deficit related to limited prior education as evidenced by per patient/family report.  GOAL:   Patient will meet greater than or equal to 90% of their needs  MONITOR:   PO intake, Skin  REASON FOR ASSESSMENT:   Consult Assessment of nutrition requirement/status, Diet education  ASSESSMENT:   88 yo male admitted with PNA, AKI on CKD IV. PMH includes HF, DM, HLD, HTN, R BKA, stroke 2002, CKD.  Patient reports good appetite and good intake of meals. He usually prepares breakfast himself (egg sandwich or hot cakes); lunch is provided by meals on wheels (hot meal with meat and vegetables); dinner is prepared by his grandson (meat and vegetables). He eats Keto bread, but does not follow a specific diet.   Labs and medications reviewed. Low albumin 2.2; albumin is not an indicator of nutritional status, but rather an indicator of morbidity and mortality, and recovery from acute and chronic illness.  Weight history reviewed. Weight is trending up d/t edema. Patient is receiving IV albumin and lasix  for diuresis today.   Diet Order:   Diet Order     None       EDUCATION NEEDS:   Education needs have been addressed  Skin:  Skin Assessment: Reviewed RN Assessment  Last BM:  7/2  Height:   Ht Readings from Last 1 Encounters:  12/11/23 6' 1 (1.854 m)    Weight:   Wt Readings from Last 1 Encounters:  12/11/23 120.2 kg    Ideal Body Weight:  83.6 kg  BMI:  Body mass index is 34.96 kg/m.  Estimated Nutritional Needs:   Kcal:  1800-2000  Protein:  100-115 gm  Fluid:  1.8-2 L   Suzen HUNT RD, LDN, CNSC Contact via secure chat. If unavailable, use group chat RD Inpatient.

## 2023-12-14 NOTE — Progress Notes (Signed)
 Video chat with patient just completed. Patient found stable and in no distress. Denies any pain or discomfort at this time. CBG reading via Dexcom 98. RN encouraged patient to eat a snack prior to bedtime. Patient opted to take bedtime eyedrops early and this was completed with assistance from from his wife. Patient and wife reminded to call RN anytime during the night if needed.

## 2023-12-14 NOTE — Discharge Instructions (Signed)
 Heart Healthy, Consistent Carbohydrate Nutrition Therapy   A heart-healthy and consistent carbohydrate diet is recommended to manage heart disease and diabetes. To follow a heart-healthy and consistent carbohydrate diet, Eat a balanced diet with whole grains, fruits and vegetables, and lean protein sources.  Choose heart-healthy unsaturated fats. Limit saturated fats, trans fats, and cholesterol intake. Eat more plant-based or vegetarian meals using beans and soy foods for protein.  Eat whole, unprocessed foods to limit the amount of sodium (salt) you eat.  Choose a consistent amount of carbohydrate at each meal and snack. Limit refined carbohydrates especially sugar, sweets and sugar-sweetened beverages.  If you drink alcohol, do so in moderation: one serving per day (women) and two servings per day (men). o One serving is equivalent to 12 ounces beer, 5 ounces wine, or 1.5 ounces distilled spirits  Tips Tips for Choosing Heart-Healthy Fats Choose lean protein and low-fat dairy foods to reduce saturated fat intake. Saturated fat is usually found in animal-based protein and is associated with certain health risks. Saturated fat is the biggest contributor to raise low-density lipoprotein (LDL) cholesterol levels. Research shows that limiting saturated fat lowers unhealthy cholesterol levels. Eat no more than 7% of your total calories each day from saturated fat. Ask your RDN to help you determine how much saturated fat is right for you. There are many foods that do not contain large amounts of saturated fats. Swapping these foods to replace foods high in saturated fats will help you limit the saturated fat you eat and improve your cholesterol levels. You can also try eating more plant-based or vegetarian meals. Instead of. Try:  Whole milk, cheese, yogurt, and ice cream 1% or skim milk, low-fat cheese, non-fat yogurt, and low-fat ice cream  Fatty, marbled beef and pork Lean beef, pork, or venison   Poultry with skin Poultry without skin  Butter, stick margarine Reduced-fat, whipped, or liquid spreads  Coconut oil, palm oil Liquid vegetable oils: corn, canola, olive, soybean and safflower oils   Avoid foods that contain trans fats. Trans fats increase levels of LDL-cholesterol. Hydrogenated fat in processed foods is the main source of trans fats in foods.  Trans fats can be found in stick margarine, shortening, processed sweets, baked goods, some fried foods, and packaged foods made with hydrogenated oils. Avoid foods with "partially hydrogenated oil" on the ingredient list such as: cookies, pastries, baked goods, biscuits, crackers, microwave popcorn, and frozen dinners. Choose foods with heart healthy fats. Polyunsaturated and monounsaturated fat are unsaturated fats that may help lower your blood cholesterol level when used in place of saturated fat in your diet. Ask your RDN about taking a dietary supplement with plant sterols and stanols to help lower your cholesterol level. Research shows that substituting saturated fats with unsaturated fats is beneficial to cholesterol levels. Try these easy swaps: Instead of. Try:  Butter, stick margarine, or solid shortening Reduced-fat, whipped, or liquid spreads  Beef, pork, or poultry with skin Fish and seafood  Chips, crackers, snack foods Raw or unsalted nuts and seeds or nut butters Hummus with vegetables Avocado on toast  Coconut oil, palm oil Liquid vegetable oils: corn, canola, olive, soybean and safflower oils  Limit the amount of cholesterol you eat to less than 200 milligrams per day. Cholesterol is a substance carried through the bloodstream via lipoproteins, which are known as "transporters" of fat. Some body functions need cholesterol to work properly, but too much cholesterol in the bloodstream can damage arteries and build up blood vessel linings (  which can lead to heart attack and stroke). You should eat less than 200 milligrams  cholesterol per day. People respond differently to eating cholesterol. There is no test available right now that can figure out which people will respond more to dietary cholesterol and which will respond less. For individuals with high intake of dietary cholesterol, different types of increase (none, small, moderate, large) in LDL-cholesterol levels are all possible.  Food sources of cholesterol include egg yolks and organ meats such as liver, gizzards. Limit egg yolks to two to four per week and avoid organ meats like liver and gizzards to control cholesterol intake. Tips for Choosing Heart-Healthy Carbohydrates Consume a consistent amount of carbohydrate It is important to eat foods with carbohydrates in moderation because they impact your blood glucose level. Carbohydrates can be found in many foods such as: Grains (breads, crackers, rice, pasta, and cereals)  Starchy Vegetables (potatoes, corn, and peas)  Beans and legumes  Milk, soy milk, and yogurt  Fruit and fruit juice  Sweets (cakes, cookies, ice cream, jam and jelly) Your RDN will help you set a goal for how many carbohydrate servings to eat at your meals and snacks. For many adults, eating 3 to 5 servings of carbohydrate foods at each meal and 1 or 2 carbohydrate servings for each snack works well.  Check your blood glucose level regularly. It can tell you if you need to adjust when you eat carbohydrates. Choose foods rich in viscous (soluble) fiber Viscous, or soluble, is found in the walls of plant cells. Viscous fiber is found only in plant-based foods. Eating foods with fiber helps to lower your unhealthy cholesterol and keep your blood glucose in range  Rich sources of viscous fiber include vegetables (asparagus, Brussels sprouts, sweet potatoes, turnips) fruit (apricots, mangoes, oranges), legumes, and whole grains (barley, oats, and oat bran).  As you increase your fiber intake gradually, also increase the amount of water you  drink. This will help prevent constipation.  If you have difficulty achieving this goal, ask your RDN about fiber laxatives. Choose fiber supplements made with viscous fibers such as psyllium seed husks or methylcellulose to help lower unhealthy cholesterol.  Limit refined carbohydrates  There are three types of carbohydrates: starches, sugar, and fiber. Some carbohydrates occur naturally in food, like the starches in rice or corn or the sugars in fruits and milk. Refined carbohydrates--foods with high amounts of simple sugars--can raise triglyceride levels. High triglyceride levels are associated with coronary heart disease. Some examples of refined carbohydrate foods are table sugar, sweets, and beverages sweetened with added sugar. Tips for Reducing Sodium (Salt) Although sodium is important for your body to function, too much sodium can be harmful for people with high blood pressure. As sodium and fluid buildup in your tissues and bloodstream, your blood pressure increases. High blood pressure may cause damage to other organs and increase your risk for a stroke. Even if you take a pill for blood pressure or a water pill (diuretic) to remove fluid, it is still important to have less salt in your diet. Ask your doctor and RDN what amount of sodium is right for you. Avoid processed foods. Eat more fresh foods.  Fresh fruits and vegetables are naturally low in sodium, as well as frozen vegetables and fruits that have no added juices or sauces.  Fresh meats are lower in sodium than processed meats, such as bacon, sausage, and hotdogs. Read the nutrition label or ask your butcher to help you find a  fresh meat that is low in sodium. Eat less salt--at the table and when cooking.  A single teaspoon of table salt has 2,300 mg of sodium.  Leave the salt out of recipes for pasta, casseroles, and soups.  Ask your RDN how to cook your favorite recipes without sodium Be a smart shopper.  Look for food packages  that say "salt-free" or "sodium-free." These items contain less than 5 milligrams of sodium per serving.  "Very low-sodium" products contain less than 35 milligrams of sodium per serving.  "Low-sodium" products contain less than 140 milligrams of sodium per serving.  Beware for "Unsalted" or "No Added Salt" products. These items may still be high in sodium. Check the nutrition label. Add flavors to your food without adding sodium.  Try lemon juice, lime juice, fruit juice or vinegar.  Dry or fresh herbs add flavor. Try basil, bay leaf, dill, rosemary, parsley, sage, dry mustard, nutmeg, thyme, and paprika.  Pepper, red pepper flakes, and cayenne pepper can add spice t your meals without adding sodium. Hot sauce contains sodium, but if you use just a drop or two, it will not add up to much.  Buy a sodium-free seasoning blend or make your own at home. Additional Lifestyle Tips Achieve and maintain a healthy weight. Talk with your RDN or your doctor about what is a healthy weight for you. Set goals to reach and maintain that weight.  To lose weight, reduce your calorie intake along with increasing your physical activity. A weight loss of 10 to 15 pounds could reduce LDL-cholesterol by 5 milligrams per deciliter. Participate in physical activity. Talk with your health care team to find out what types of physical activity are best for you. Set a plan to get about 30 minutes of exercise on most days.  Foods Recommended Food Group Foods Recommended  Grains Whole grain breads and cereals, including whole wheat, barley, rye, buckwheat, corn, teff, quinoa, millet, amaranth, brown or wild rice, sorghum, and oats Pasta, especially whole wheat or other whole grain types  [employer] [place] [employer], quinoa or wild rice Whole grain crackers, bread, rolls, pitas Home-made bread with reduced-sodium baking soda  Protein Foods Lean cuts of beef and pork (loin, leg, round, extra lean hamburger)  Skinless Press photographer  and other wild game Dried beans and peas Nuts and nut butters Meat alternatives made with soy or textured vegetable protein  Egg whites or egg substitute Cold cuts made with lean meat or soy protein  Dairy Nonfat (skim), low-fat, or 1%-fat milk  Nonfat or low-fat yogurt or cottage cheese Fat-free and low-fat cheese  Vegetables Fresh, frozen, or canned vegetables without added fat or salt   Fruits Fresh, frozen, canned, or dried fruit   Oils Unsaturated oils (corn, olive, peanut, soy, sunflower, canola)  Soft or liquid margarines and vegetable oil spreads  Salad dressings Seeds and nuts  Avocado   Foods Not Recommended Food Group Foods Not Recommended  Grains Breads or crackers topped with salt Cereals (hot or cold) with more than 300 mg sodium per serving Biscuits, cornbread, and other "quick" breads prepared with baking soda Bread crumbs or stuffing mix from a store High-fat bakery products, such as doughnuts, biscuits, croissants, danish pastries, pies, cookies Instant cooking foods to which you add hot water and stir--potatoes, noodles, rice, etc. Packaged starchy foods--seasoned noodle or rice dishes, stuffing mix, macaroni and cheese dinner Snacks made with partially hydrogenated oils, including chips, cheese puffs, snack mixes, regular crackers, butter-flavored popcorn  Protein Foods  Higher-fat cuts of meats (ribs, t-bone steak, regular hamburger) Bacon, sausage, or hot dogs Cold cuts, such as salami or bologna, deli meats, cured meats, corned beef Organ meats (liver, brains, gizzards, sweetbreads) Poultry with skin Fried or smoked meat, poultry, and fish Whole eggs and egg yolks (more than 2-4 per week) Salted legumes, nuts, seeds, or nut/seed butters Meat alternatives with high levels of sodium (>300 mg per serving) or saturated fat (>5 g per serving)  Dairy Whole milk,?2% fat milk, buttermilk Whole milk yogurt or ice cream Cream Half-&-half Cream cheese Sour  cream Cheese  Vegetables Canned or frozen vegetables with salt, fresh vegetables prepared with salt, butter, cheese, or cream sauce Fried vegetables Pickled vegetables such as olives, pickles, or sauerkraut  Fruits Fried fruits Fruits served with butter or cream  Oils Butter, stick margarine, shortening Partially hydrogenated oils or trans fats Tropical oils (coconut, palm, palm kernel oils)  Other Candy, sugar sweetened soft drinks and desserts Salt, sea salt, garlic salt, and seasoning mixes containing salt Bouillon cubes Ketchup, barbecue sauce, Worcestershire sauce, soy sauce, teriyaki sauce Miso Salsa Pickles, olives, relish   Heart Healthy Consistent Carbohydrate Vegetarian (Lacto-Ovo) Sample 1-Day Menu  Breakfast 1 cup oatmeal, cooked (2 carbohydrate servings)   cup blueberries (1 carbohydrate serving)  11 almonds, without salt  1 cup 1% milk (1 carbohydrate serving)  1 cup coffee  Morning Snack 1 cup fat-free plain yogurt (1 carbohydrate serving)  Lunch 1 whole wheat bun (1 carbohydrate servings)  1 black bean burger (1 carbohydrate servings)  1 slice cheddar cheese, low sodium  2 slices tomatoes  2 leaves lettuce  1 teaspoon mustard  1 small pear (1 carbohydrate servings)  1 cup green tea, unsweetened  Afternoon Snack 1/3 cup trail mix with nuts, seeds, and raisins, without salt (1 carbohydrate servinga)  Evening Meal  cup meatless chicken  2/3 cup brown rice, cooked (2 carbohydrate servings)  1 cup broccoli, cooked (2/3 carbohydrate serving)   cup carrots, cooked (1/3 carbohydrate serving)  2 teaspoons olive oil  1 teaspoon balsamic vinegar  1 whole wheat dinner roll (1 carbohydrate serving)  1 teaspoon margarine, soft, tub  1 cup 1% milk (1 carbohydrate serving)  Evening Snack 1 extra small banana (1 carbohydrate serving)  1 tablespoon peanut butter   Heart Healthy Consistent Carbohydrate Vegan Sample 1-Day Menu  Breakfast 1 cup oatmeal, cooked (2  carbohydrate servings)   cup blueberries (1 carbohydrate serving)  11 almonds, without salt  1 cup soymilk fortified with calcium, vitamin B12, and vitamin D  1 cup coffee  Morning Snack 6 ounces soy yogurt (1 carbohydrate servings)  Lunch 1 whole wheat bun(1 carbohydrate servings)  1 black bean burger (1 carbohydrate serving)  2 slices tomatoes  2 leaves lettuce  1 teaspoon mustard  1 small pear (1 carbohydrate servings)  1 cup green tea, unsweetened  Afternoon Snack 1/3 cup trail mix with nuts, seeds, and raisins, without salt (1 carbohydrate servings)  Evening Meal  cup meatless chicken  2/3 cup brown rice, cooked (2 carbohydrate servings)  1 cup broccoli, cooked (2/3 carbohydrate serving)   cup carrots, cooked (1/3 carbohydrate serving)  2 teaspoons olive oil  1 teaspoon balsamic vinegar  1 whole wheat dinner roll (1 carbohydrate serving)  1 teaspoon margarine, soft, tub  1 cup soymilk fortified with calcium, vitamin B12, and vitamin D  Evening Snack 1 extra small banana (1 carbohydrate serving)  1 tablespoon peanut butter    Heart Healthy Consistent Carbohydrate Sample  1-Day Menu  Breakfast 1 cup cooked oatmeal (2 carbohydrate servings)  3/4 cup blueberries (1 carbohydrate serving)  1 ounce almonds  1 cup skim milk (1 carbohydrate serving)  1 cup coffee  Morning Snack 1 cup sugar-free nonfat yogurt (1 carbohydrate serving)  Lunch 2 slices whole-wheat bread (2 carbohydrate servings)  2 ounces lean [place] breast  1 ounce low-fat Swiss cheese  1 teaspoon mustard  1 slice tomato  1 lettuce leaf  1 small pear (1 carbohydrate serving)  1 cup skim milk (1 carbohydrate serving)  Afternoon Snack 1 ounce trail mix with unsalted nuts, seeds, and raisins (1 carbohydrate serving)  Evening Meal 3 ounces salmon  2/3 cup cooked brown rice (2 carbohydrate servings)  1 teaspoon soft margarine  1 cup cooked broccoli with 1/2 cup cooked carrots (1 carbohydrate serving  Carrots,  cooked, boiled, drained, without salt  1 cup lettuce  1 teaspoon olive oil with vinegar for dressing  1 small whole grain roll (1 carbohydrate serving)  1 teaspoon soft margarine  1 cup unsweetened tea  Evening Snack 1 extra-small banana (1 carbohydrate serving)  Copyright 2020  [employer]. All rights reserved.

## 2023-12-14 NOTE — Progress Notes (Signed)
 Physical Therapy Treatment Patient Details Name: Micheal Walls MRN: [id number] DOB: 1936/01/22 Today's Date: 12/14/2023   History of Present Illness 88 y.o. male presents to [facility] on 12/10/2023 with fatigue, malaise, loss of appetite and cough. Chest x-ray is concerning for RUL PNA. PMH includes HTN, DMII, OSA, PAD, CVA, hypothyroidism, HFpEF, MGUS.    PT Comments  Virtual follow-up completed with patient while [place], paramedic, was present. She confirms pt able to transfer himself but reports some increased pitting edema that has been making him fatigue easily with toilet transfers. Urinal provided in the event he is too fatigued/SOB to make safe transfer. Encouraged staff to have pt increase transfers to/from toilet once fluid levels improve and he is less fatigued to maintain current level of function. Discussed importance of HEP during admission and this was sent digitally to email address patient provided. Aware that he or team can reach out to our department at any time for questions or concerns. I have updated recommendations to have HHPT follow-up once formally d/c from H@H  program due to weakness and fatigue with transfers, however he may feel back to baseline once fluid comes off. Will continue to follow acutely. Patient will continue to benefit from skilled physical therapy services to further improve independence with functional mobility.     If plan is discharge home, recommend the following: A little help with walking and/or transfers;Assist for transportation;Help with stairs or ramp for entrance   Can travel by private vehicle        Equipment Recommendations  None recommended by PT    Recommendations for Other Services       Precautions / Restrictions Precautions Precautions: Fall Recall of Precautions/Restrictions: Intact Restrictions Weight Bearing Restrictions Per Provider Order: No     Mobility  Bed Mobility               General bed mobility  comments: States he has been able to transfer himself independently. Feels close to baseline with mobility.    Transfers                   General transfer comment: Pt reports he has been able to transfer himself independently at home to and from power wheelchair.    Ambulation/Gait               General Gait Details: States he is non-ambulatory, only transfers and uses power w/c for mobility.   Stairs             Wheelchair Mobility     Tilt Bed    Modified Rankin (Stroke Patients Only)       Balance                                            Communication Communication Communication: Impaired Factors Affecting Communication: Hearing impaired  Cognition Arousal: Alert Behavior During Therapy: WFL for tasks assessed/performed   PT - Cognitive impairments: No apparent impairments                         Following commands: Impaired Following commands impaired: Only follows one step commands consistently    Cueing Cueing Techniques: Verbal cues  Exercises Other Exercises Other Exercises: Access Code: [email or web address]  URL: [email or web address]  Date: 12/14/2023  Prepared by: Leontine Roads    Exercises  - Seated Ankle Pumps  -  3 x daily - 7 x weekly - 2 sets - 10 reps  - Seated Long Arc Quad  - 3 x daily - 7 x weekly - 2 sets - 10 reps  - Seated March  - 3 x daily - 7 x weekly - 2 sets - 10 reps  - Seated Gluteal Sets  - 3 x daily - 7 x weekly - 2 sets - 10 reps Other Exercises: Digital HEP provided which contains visual. Pictures, videos, and description of activities to help strength, ROM, and prevent muscle atrophy while less mobile. He is open to HHPT follow-up.    General Comments General comments (skin integrity, edema, etc.): Sitting upright during visit, has paramedic in room with patient at home. Able to troubleshoot concerns. Advised to avoid shower/bath due to equipment/IV. Pt states he was able to dress  himself this morning. Has been able to transfer with stand pivot technique and feels like he is about at his baseline level. Not ambulating. Uses power w/c in home, has a ramp to exit home. Son and nephew still available to help PRN.      Pertinent Vitals/Pain Pain Assessment Pain Assessment: No/denies pain    Home Living                          Prior Function            PT Goals (current goals can now be found in the care plan section) Acute Rehab PT Goals Patient Stated Goal: Get well. PT Goal Formulation: With patient Time For Goal Achievement: 12/25/23 Potential to Achieve Goals: Good Progress towards PT goals: Progressing toward goals    Frequency    Min 1X/week      PT Plan      Co-evaluation              AM-PAC PT 6 Clicks Mobility   Outcome Measure  Help needed turning from your back to your side while in a flat bed without using bedrails?: None Help needed moving from lying on your back to sitting on the side of a flat bed without using bedrails?: None Help needed moving to and from a bed to a chair (including a wheelchair)?: None Help needed standing up from a chair using your arms (e.g., wheelchair or bedside chair)?: None Help needed to walk in hospital room?: Total Help needed climbing 3-5 steps with a railing? : Total 6 Click Score: 18    End of Session   Activity Tolerance: Patient tolerated treatment well Patient left: in chair (Paramedic in room with patient)   PT Visit Diagnosis: Muscle weakness (generalized) (M62.81)     Time: 8688-8677 PT Time Calculation (min) (ACUTE ONLY): 11 min  Charges:    $Self Care/Home Management: 8-22 PT General Charges $$ ACUTE PT VISIT: 1 Visit                     Leontine Roads, PT, DPT [facility]  Rehabilitation Services Physical Therapist Office: [phone] Website: [email or web address]    Leontine GORMAN Roads 12/14/2023, 1:46 PM

## 2023-12-15 DIAGNOSIS — N1832 Chronic kidney disease, stage 3b: Secondary | ICD-10-CM

## 2023-12-15 LAB — COMPREHENSIVE METABOLIC PANEL WITH GFR
ALT: 91 U/L — ABNORMAL HIGH (ref 0–44)
AST: 61 U/L — ABNORMAL HIGH (ref 15–41)
Albumin: 3.1 g/dL — ABNORMAL LOW (ref 3.5–5.0)
Alkaline Phosphatase: 76 U/L (ref 38–126)
Anion gap: 13 (ref 5–15)
BUN: 62 mg/dL — ABNORMAL HIGH (ref 8–23)
CO2: 21 mmol/L — ABNORMAL LOW (ref 22–32)
Calcium: 10.3 mg/dL (ref 8.9–10.3)
Chloride: 105 mmol/L (ref 98–111)
Creatinine, Ser: 2.3 mg/dL — ABNORMAL HIGH (ref 0.61–1.24)
GFR, Estimated: 27 mL/min — ABNORMAL LOW (ref >60)
Glucose, Bld: 131 mg/dL — ABNORMAL HIGH (ref 70–99)
Potassium: 4.3 mmol/L (ref 3.5–5.1)
Sodium: 139 mmol/L (ref 135–145)
Total Bilirubin: 0.8 mg/dL (ref 0.0–1.2)
Total Protein: 7.1 g/dL (ref 6.5–8.1)

## 2023-12-15 LAB — BRAIN NATRIURETIC PEPTIDE: B Natriuretic Peptide: 99.4 pg/mL (ref 0.0–100.0)

## 2023-12-15 MED ORDER — DILTIAZEM HCL ER COATED BEADS 180 MG PO CP24: 180.0000 mg | ORAL_CAPSULE | Freq: Every day | ORAL | 1 refills | Status: DC

## 2023-12-15 MED ORDER — GUAIFENESIN 100 MG/5ML PO LIQD: 10.0000 mL | Freq: Three times a day (TID) | ORAL | 0 refills | Status: AC | PRN

## 2023-12-15 NOTE — Progress Notes (Signed)
 No current health alarms or phone calls to RN from patient overnight.

## 2023-12-15 NOTE — Progress Notes (Signed)
 Virtual visit with Micheal Walls. No complaints, questions, or concerns. Eye drops administered per patient. Discussed plan for lab work in the morning with possible discharge. Encouraged to call with any concerns through the night to [phone].

## 2023-12-15 NOTE — Progress Notes (Signed)
 PT is alert and oriented to his baseline. He met paramedic Deforrest Bogle in the living room without incident. PT stated that he slept well last night and denied any pain or complaints this morning. PT dressed and cleaned himself this morning without assistance.   PT's vitals were obtained and documented. PT denied any pain. PT c/o dyspnea upon exertion but has improved since he was initially admitted. ABD is soft, non-tender. Last BM was this morning and reports that it was loose stool. PT has +3 pitting edema in left leg.   PT's medications were administered per orders. Blood draw was performed in right AC. CGM had a BGL of 108. PT had not eaten breakfast yet this morning and was going to do so afterwards.   PT had meeting with Dr. Willette w/o incident and discussed possible d/c on 12/16/23. PT understood and agreed with the physician.   PT denied having any other questions and was reminded to call the RN if he needed help with anything. Wearable was placed back on right bicep.

## 2023-12-15 NOTE — Progress Notes (Signed)
 PROGRESS NOTE    Micheal Walls  FMW:[id number]  DOB: 07/09/1935   PCP: [name], [name]    DOA: 12/10/2023 LOS: 4    Subjective 12/13/23    Virtual visit: The patient was seen and examined virtually.  Paramedic Ms. Richerd Eastern was present at bedside.  RN Shanda was present on a video call.  Patient has no complaint, denies any shortness of breath or chest pain, reporting improved cough No issues overnight   Brief Narrative   Micheal Walls is an 88 y.o. male with medical history significant for hypertension, type 2 diabetes mellitus, OSA, PAD, CVA, hypothyroidism, chronic HFpEF, and MGUS who presents with fatigue, malaise, loss of appetite, and cough.   Patient began to feel generally poor on 12/08/2023.  He has been mainly staying in bed since then with general malaise and cough.  He has not felt particularly short of breath but has not been very active at all.  He also reports a loss of appetite but denies abdominal pain or vomiting.  He reports that his urine volume seems to have decreased over the past day or 2 but denies dysuria.   ED Course: Upon arrival to the ED, patient is found to be afebrile and saturating mid 90s on room air with tachypnea, mild tachycardia, and stable blood pressure.  Labs are most notable for creatinine 2.78, albumin  2.6, AST 141, ALT 198, normal WBC, and normal lactic acid.  Chest x-ray is concerning for right upper lobe pneumonia.   Blood cultures were collected and the patient was given a liter of LR, Rocephin , and azithromycin .  Covid/influenza A/influenza B/RSV PCR were negative.  Assessment & Plan   Principal Problem:   PNA (pneumonia) Active Problems:   Essential hypertension   OSA on CPAP   Morbid obesity (HCC)   History of stroke   Acute renal failure superimposed on stage 4 chronic kidney disease (HCC)   Chronic diastolic (congestive) heart failure (HCC)   MGUS (monoclonal gammopathy of unknown significance)    Hypothyroidism   S/P unilateral BKA (below knee amputation) (HCC)   PAD (peripheral artery disease) (HCC)   Type 2 diabetes mellitus with stage 4 chronic kidney disease, with long-term current use of insulin  (HCC)   Elevated transaminase level   Swelling of left lower extremity  Assessment and Plan:   * PNA (pneumonia)/right lower lobe pneumonia -Much improved shortness of breath, cough, satting 98% on room air - Improved cough with current medication  POA: weakness, fatigue and cough on initial presentation with noted RUL pneumonia on imaging 12/10/22    PSI score 4  -Blood cultures-no growth to date -IV antibiotics of Rocephin , p.o. azithromycin  -completed 5 days of antibiotics   Remained afebrile, mildly hypotensive, with no leukocytosis    Acute renal failure superimposed on stage 4 chronic kidney disease (HCC) Worsening kidney function-monitor closely -Avoid nephrotoxins -Unfortunately we need to resume Lasix  40 mg twice daily due to his redeveloping left lower extremity edema  - He is to follow-up with his nephrologist on August 21 - Will monitor kidney function today and tomorrow  Worsening  eGFR  - S/p IV fluid resuscitation: Since admission: Status post LR 1 L bolus at midnight 30 on 12/11/23 and LR IVF at 100 ml/hr, received another NS 250 ml bolus + 500 mL normal saline  12/13/2023 -Discontinued all IVF -Monitor BUN/creatinine  Lab Results  Component Value Date   CREATININE 2.57 (H) 12/13/2023   CREATININE 2.59 (H) 12/12/2023  CREATININE 2.47 (H) 12/11/2023   - Resuming p.o. Lasix   Hypoalbuminemia: Initiating 25 g of IV albumin + 50 g IV albumin   followed by 40 mg of Lasix  on 7/3    Morbid obesity (HCC) Body mass index is 34.96 kg/m. This complicates overall care and prognosis.    OSA on CPAP Continue CPAP qhs   Essential hypertension - Blood pressure has improved and stabilized Home diltiazem  CD240 mg was reduced to 180 mg daily due to hypotension -  Resume Lasix  40 mg p.o. twice daily   Hypothyroidism - Home levothyroxine  25 mcg daily   MGUS (monoclonal gammopathy of unknown significance) Appears at baseline, continue outpatient followup as appropriate   Chronic diastolic (congestive) heart failure (HCC) At baseline -S/p gentle IV fluid hydration -Last echo 07/19/2020: EF 55-60% moderate left ventricular hypertrophy.  Indeterminate diastolic filling due  to E-A fusion.  -Monitoring daily weight difficult to do so due to his prosthesis, severe debility - BNP, 48,>>>  60.7, - Developing mild lower extremity edema, resuming home diuretics   Swelling of left lower extremity Appears trace and at baseline Resuming- furosemide  40 mg twice daily   Elevated transaminase level -likely due to infection, hypertension, improving   Type 2 diabetes mellitus with stage 4 chronic kidney disease, with long-term current use of insulin  (HCC) Goal CBG 140-180 Continue home medications and current monitoring   PAD (peripheral artery disease) (HCC) Continue home asa 81 mg daily        Disposition Plan & Communication   Dispo & Barriers: continue clinical course Exp d/c date: 12/16/2023 Medically stable for d/c: Not at this time  Family Communication: no, patient states spouse has mild dementia   Consults, Procedures, Significant Events   Consultants:  PT on 12/11/2023  Antimicrobials:  Azithromycin  500 mg PO, to complete 5 doses Ceftriaxone  2 g IV, daily, to complete 5 day course  Significant Events: Complaining of cough, breath  Objective   Vitals:   12/13/23 1700 12/14/23 1700 12/15/23 0824 12/15/23 0935  BP: 109/61 (!) 145/73 (!) 142/71 (!) 147/71  Pulse: 70 77 94   Resp: 18     Temp: 97.7 F (36.5 C)     TempSrc: Oral     SpO2: 97% 96%    Weight:      Height:       No intake or output data in the 24 hours ending 12/15/23 0947   Filed Weights   12/11/23 0151 12/11/23 1452  Weight: 123 kg 120.2 kg   Physical exam  visualized, performed by paramedic(Vicki)   General:  AAO x 3,  cooperative, no distress;   HEENT:  Normocephalic, PERRL, otherwise with in Normal limits   Neuro:  Visualized: CNII-XII intact. , normal motor and sensation, reflexes intact   Lungs:   Reported: clear to auscultation BL, Respirations unlabored,  No wheezes / -mildly diminished breath sounds in lower lobes  Cardio:    Reported S1/S2, RRR, No murmure, No Rubs or Gallops   Abdomen:  Reported: soft, non-tender, bowel sounds active all four quadrants, no guarding or peritoneal signs.  Muscular  skeletal:  Right BKA-stump visualized clean, no open wounds Left +2 pitting edema Limited exam -global generalized weaknesses - in bed, able to move all 4 extremities,   2+ pulses,  symmetric,+2  pitting edema  Skin:  Dry, warm to touch, negative for any Rashes,  Wounds: Please see nursing documentation              Labs  Data Reviewed: I have personally reviewed following labs and imaging studies  CBC: Recent Labs  Lab 12/10/23 2327 12/11/23 0438 12/12/23 2109 12/13/23 1200  WBC 9.8 9.3 8.3 8.7  HGB 11.2* 10.9* 9.9* 10.3*  HCT 34.3* 33.5* 29.6* 30.6*  MCV 86.2 86.3 86.8 84.8  PLT 324 317 295 334   Basic Metabolic Panel: Recent Labs  Lab 12/10/23 2327 12/11/23 0438 12/11/23 1802 12/12/23 1005 12/13/23 1200  NA 136 136 137 138 135  K 3.8 3.6 4.1 4.0 3.7  CL 101 101 102 103 101  CO2 20* 24 22 23  21*  GLUCOSE 175* 160* 164* 168* 175*  BUN 75* 66* 61* 65* 70*  CREATININE 2.78* 2.41* 2.47* 2.59* 2.57*  CALCIUM  10.0 9.7 9.6 9.9 9.8   GFR: Estimated Creatinine Clearance: 28 mL/min (A) (by C-G formula based on SCr of 2.57 mg/dL (H)). Liver Function Tests: Recent Labs  Lab 12/10/23 2327 12/11/23 0438 12/11/23 1802 12/12/23 1005 12/13/23 1200  AST 141* 136* 118* 118* 86*  ALT 198* 182* 177* 177* 137*  ALKPHOS 100 93 91 98 87  BILITOT 0.9 1.0 0.8 0.8 0.5  PROT 7.4 6.9 7.1 7.6 6.5  ALBUMIN  2.6* 2.3*  2.3* 2.5* 2.2*   No results for input(s): LIPASE, AMYLASE in the last 168 hours. No results for input(s): AMMONIA in the last 168 hours. Coagulation Profile: Recent Labs  Lab 12/10/23 2327  INR 1.3*   Cardiac Enzymes: Recent Labs  Lab 12/10/23 2327  CKTOTAL 482*    CBG: Recent Labs  Lab 12/11/23 0754  GLUCAP 163*   L Sepsis Labs: Recent Labs  Lab 12/11/23 0029  LATICACIDVEN 1.2    Recent Results (from the past 240 hours)  Blood Culture (routine x 2)     Status: None (Preliminary result)   Collection Time: 12/10/23 11:33 PM   Specimen: BLOOD  Result Value Ref Range Status   Specimen Description BLOOD SITE NOT SPECIFIED  Final   Special Requests   Final    BOTTLES DRAWN AEROBIC AND ANAEROBIC Blood Culture results may not be optimal due to an inadequate volume of blood received in culture bottles   Culture   Final    NO GROWTH 4 DAYS Performed at [facility], [address] [place], [place] [address]    Report Status PENDING  Incomplete  Blood Culture (routine x 2)     Status: None (Preliminary result)   Collection Time: 12/11/23 12:46 AM   Specimen: BLOOD  Result Value Ref Range Status   Specimen Description BLOOD BLOOD RIGHT ARM  Final   Special Requests   Final    BOTTLES DRAWN AEROBIC AND ANAEROBIC Blood Culture adequate volume   Culture   Final    NO GROWTH 4 DAYS Performed at [facility], [address], [place], [place] [address]    Report Status PENDING  Incomplete  Resp panel by RT-PCR (RSV, Flu A&B, Covid) Anterior Nasal Swab     Status: None   Collection Time: 12/11/23  1:15 AM   Specimen: Anterior Nasal Swab  Result Value Ref Range Status   SARS Coronavirus 2 by RT PCR NEGATIVE NEGATIVE Final   Influenza A by PCR NEGATIVE NEGATIVE Final   Influenza B by PCR NEGATIVE NEGATIVE Final    Comment: (NOTE) The Xpert Xpress SARS-CoV-2/FLU/RSV plus assay is intended as an aid in the diagnosis of influenza from Nasopharyngeal swab  specimens and should not be used as a sole basis for treatment. Nasal  washings and aspirates are unacceptable for Xpert Xpress SARS-CoV-2/FLU/RSV testing.  Fact Sheet for Patients: BloggerCourse.com  Fact Sheet for Healthcare Providers: SeriousBroker.it  This test is not yet approved or cleared by the [place]  FDA and has been authorized for detection and/or diagnosis of SARS-CoV-2 by FDA under an Emergency Use Authorization (EUA). This EUA will remain in effect (meaning this test can be used) for the duration of the COVID-19 declaration under Section 564(b)(1) of the Act, 21 U.S.C. section 360bbb-3(b)(1), unless the authorization is terminated or revoked.     Resp Syncytial Virus by PCR NEGATIVE NEGATIVE Final    Comment: (NOTE) Fact Sheet for Patients: BloggerCourse.com  Fact Sheet for Healthcare Providers: SeriousBroker.it  This test is not yet approved or cleared by the [place]  FDA and has been authorized for detection and/or diagnosis of SARS-CoV-2 by FDA under an Emergency Use Authorization (EUA). This EUA will remain in effect (meaning this test can be used) for the duration of the COVID-19 declaration under Section 564(b)(1) of the Act, 21 U.S.C. section 360bbb-3(b)(1), unless the authorization is terminated or revoked.  Performed at [facility], [address], [place], [place] [address]       Imaging Studies   No results found.    Medications   Scheduled Meds:  aspirin  EC  81 mg Oral Daily   diltiazem   180 mg Oral Daily   dorzolamide   1 drop Both Eyes BID   enoxaparin  (LOVENOX ) injection  30 mg Subcutaneous Daily   furosemide   40 mg Oral BID   latanoprost   1 drop Both Eyes QHS   levothyroxine   25 mcg Oral Once per day on Monday Tuesday Wednesday Thursday Friday   multivitamin with minerals  1 tablet Oral Daily   tamsulosin   0.4 mg  Oral Daily   Continuous Infusions:     LOS: 4 days   Time spent: 65 minutes  SIGNED: Adriana DELENA Grams, MD, FHM. FAAFP [facility],  Pager (please use [email or web address] to page/text)  Please use Epic Secure Chat for non-urgent communication (7AM-7PM) If 7PM-7AM, please contact night-coverage [email or web address],  12/15/2023, 9:47 AM

## 2023-12-15 NOTE — Plan of Care (Signed)
 Talked to patient via [employer] video chat. Aox4. No complaints of pain or discomfort. Denies SOB, headache, and dizziness. Room air. LBM 7/4. Plan of care reviewed with patient. Per Dr. Willette, pt should continue talking lasix  as ordered. Pt agreeable to plan. Possible d/c tomorrow.   Problem: Metabolic: Goal: Ability to maintain appropriate glucose levels will improve Outcome: Progressing   Problem: Activity: Goal: Ability to tolerate increased activity will improve Outcome: Progressing   Problem: Clinical Measurements: Goal: Ability to maintain a body temperature in the normal range will improve Outcome: Progressing   Problem: Respiratory: Goal: Ability to maintain adequate ventilation will improve Description: Patient provided PRN Neb treatment and spirometer use to lossen secretions Outcome: Progressing Goal: Ability to maintain a clear airway will improve Outcome: Progressing

## 2023-12-16 LAB — BASIC METABOLIC PANEL WITH GFR
Anion gap: 14 (ref 5–15)
BUN: 57 mg/dL — ABNORMAL HIGH (ref 8–23)
CO2: 23 mmol/L (ref 22–32)
Calcium: 10.4 mg/dL — ABNORMAL HIGH (ref 8.9–10.3)
Chloride: 102 mmol/L (ref 98–111)
Creatinine, Ser: 2.1 mg/dL — ABNORMAL HIGH (ref 0.61–1.24)
GFR, Estimated: 30 mL/min — ABNORMAL LOW (ref >60)
Glucose, Bld: 152 mg/dL — ABNORMAL HIGH (ref 70–99)
Potassium: 3.9 mmol/L (ref 3.5–5.1)
Sodium: 139 mmol/L (ref 135–145)

## 2023-12-16 LAB — CULTURE, BLOOD (ROUTINE X 2)
Culture: NO GROWTH
Culture: NO GROWTH
Special Requests: ADEQUATE

## 2023-12-16 NOTE — Progress Notes (Signed)
 PROGRESS NOTE    Micheal Walls  FMW:[id number]  DOB: 1936-03-12          PCP: [name], [name]    DOA: 12/10/2023  LOS: 5  Date of visit: 12/16/2023  Patient identified as Micheal Walls date of birth July 15, 1935, primary present at bedside Micheal Walls)  Subjective 12/13/23    Virtual visit: The patient was seen and examined virtually.  Paramedic Ms. Richerd Walls was present at bedside. No issues overnight. Hemodynamically stable  Brief Narrative   Micheal Walls is an 88 y.o. male with medical history significant for hypertension, type 2 diabetes mellitus, OSA, PAD, CVA, hypothyroidism, chronic HFpEF, and MGUS who presents with fatigue, malaise, loss of appetite, and cough.   Patient began to feel generally poor on 12/08/2023.  He has been mainly staying in bed since then with general malaise and cough.  He has not felt particularly short of breath but has not been very active at all.  He also reports a loss of appetite but denies abdominal pain or vomiting.  He reports that his urine volume seems to have decreased over the past day or 2 but denies dysuria.   ED Course: Upon arrival to the ED, patient is found to be afebrile and saturating mid 90s on room air with tachypnea, mild tachycardia, and stable blood pressure.  Labs are most notable for creatinine 2.78, albumin  2.6, AST 141, ALT 198, normal WBC, and normal lactic acid.  Chest x-ray is concerning for right upper lobe pneumonia.   Blood cultures were collected and the patient was given a liter of LR, Rocephin , and azithromycin .  Covid/influenza A/influenza B/RSV PCR were negative.  Assessment & Plan   Principal Problem:   PNA (pneumonia) Active Problems:   Essential hypertension   OSA on CPAP   Morbid obesity (HCC)   History of stroke   Acute renal failure superimposed on stage 4 chronic kidney disease (HCC)   Chronic diastolic (congestive) heart failure (HCC)   MGUS (monoclonal gammopathy of  unknown significance)   Hypothyroidism   S/P unilateral BKA (below knee amputation) (HCC)   PAD (peripheral artery disease) (HCC)   Type 2 diabetes mellitus with stage 4 chronic kidney disease, with long-term current use of insulin  (HCC)   Elevated transaminase level   Swelling of left lower extremity  Assessment and Plan:   * PNA (pneumonia)/right lower lobe pneumonia -Much improved, satting 96% room air -Completed a 5-day course of antibiotics Rocephin /azithromycin  - Improved cough with current medication  POA: weakness, fatigue and cough on initial presentation with noted RUL pneumonia on imaging 12/10/22    -Blood cultures-no growth to date  Remained afebrile, mildly hypotensive, with no leukocytosis    Acute renal failure superimposed on stage 4 chronic kidney disease (HCC) -Improving kidney function, monitor closely -Avoid nephrotoxins -Unfortunately we need to resume Lasix  40 mg twice daily due to his redeveloping left lower extremity edema  - He is to follow-up with his nephrologist on August 21.2025  - Will monitor kidney function today and tomorrow  Monitor eGFR: 26, 25, 24>> 27   - S/p IV fluid resuscitation: Since admission: Status post LR 1 L bolus at midnight 30 on 12/11/23 and LR IVF at 100 ml/hr, received another NS 250 ml bolus + 500 mL normal saline  12/13/2023 -Discontinued all IVF -Monitor BUN: As high as 70 >> 62  Lab Results  Component Value Date   CREATININE 2.30 (H) 12/15/2023   CREATININE 2.57 (H) 12/13/2023  CREATININE 2.59 (H) 12/12/2023   - Resuming p.o. Lasix   Hypoalbuminemia:   25 g of IV albumin + 50 g IV albumin   followed by 40 mg of Lasix  on 7/3    Morbid obesity (HCC) Body mass index is 34.96 kg/m. This complicates overall care and prognosis.    OSA on CPAP Continue CPAP qhs   Essential hypertension - Blood pressure has improved and stabilized Home diltiazem  CD240 mg was reduced to 180 mg daily due to hypotension - Resume Lasix   40 mg p.o. twice daily   Hypothyroidism - Home levothyroxine  25 mcg daily   MGUS (monoclonal gammopathy of unknown significance) Appears at baseline, continue outpatient followup as appropriate   Chronic diastolic (congestive) heart failure (HCC) At baseline -S/p gentle IV fluid hydration -Last echo 07/19/2020: EF 55-60% moderate left ventricular hypertrophy.  Indeterminate diastolic filling due  to E-A fusion.  -Monitoring daily weight difficult to do so due to his prosthesis, severe debility - BNP, 48,>>>  60.7 >>> 99.4  - Continue have lower extremity edema, +1   Swelling of left lower extremity -+1 pitting edema-close to baseline Resuming- furosemide  40 mg twice daily   Elevated transaminase level -likely due to infection, hypertension, improving     Latest Ref Rng & Units 12/15/2023    9:57 AM 12/13/2023   12:00 PM 12/12/2023   10:05 AM  Hepatic Function  Total Protein 6.5 - 8.1 g/dL 7.1  6.5  7.6   Albumin  3.5 - 5.0 g/dL 3.1  2.2  2.5   AST 15 - 41 U/L 61  86  118   ALT 0 - 44 U/L 91  137  177   Alk Phosphatase 38 - 126 U/L 76  87  98   Total Bilirubin 0.0 - 1.2 mg/dL 0.8  0.5  0.8    - Improving, monitor - Holding hepatotoxic including statins    Type 2 diabetes mellitus with stage 4 chronic kidney disease, with long-term current use of insulin  (HCC) Goal CBG 140-180 Continue home medications and current monitoring   PAD (peripheral artery disease) (HCC) Continue home asa 81 mg daily        Disposition Plan & Communication   Dispo & Barriers: continue clinical course Exp d/c date: 12/16/2023-monitor kidney function and LFTs Medically stable for d/c: Not at this time  Family Communication: no, patient states spouse has mild dementia   Consults, Procedures, Significant Events   Consultants:  PT on 12/11/2023  Antimicrobials:  Azithromycin  500 mg PO, to completed 5 doses Ceftriaxone  2 g IV, daily, to completed 5 day course    Objective   Vitals:    12/15/23 0824 12/15/23 0935 12/15/23 1820 12/15/23 2300  BP: (!) 142/71 (!) 147/71 (!) 147/73   Pulse: 94 80 78   Resp:  17    Temp:      TempSrc:      SpO2:  98% 98% 96%  Weight:      Height:       No intake or output data in the 24 hours ending 12/16/23 1111   Filed Weights   12/11/23 0151 12/11/23 1452  Weight: 123 kg 120.2 kg   Physical exam visualized, performed by paramedic(Vicki) General:  AAO x 3,  cooperative, no distress;   HEENT:  Normocephalic, PERRL, otherwise with in Normal limits   Neuro:  Visualized, and reported; CNII-XII intact. , normal motor and sensation, reflexes intact   Lungs:   Reported; clear to auscultation BL, Respirations unlabored,  No wheezes /  crackles  Cardio:    Reported; S1/S2, RRR, No murmure, No Rubs or Gallops   Abdomen:  Reported; soft, non-tender, bowel sounds active all four quadrants, no guarding or peritoneal signs.  Muscular  skeletal:  Right BKA-Limited exam -global generalized weaknesses - in bed, able to move all 4 extremities,   2+ pulses,  symmetric, +1 pitting edema  Skin:  Dry, warm to touch, negative for any Rashes,  Wounds: Please see nursing documentation                  Labs   Data Reviewed: I have personally reviewed following labs and imaging studies  CBC: Recent Labs  Lab 12/10/23 2327 12/11/23 0438 12/12/23 2109 12/13/23 1200  WBC 9.8 9.3 8.3 8.7  HGB 11.2* 10.9* 9.9* 10.3*  HCT 34.3* 33.5* 29.6* 30.6*  MCV 86.2 86.3 86.8 84.8  PLT 324 317 295 334   Basic Metabolic Panel: Recent Labs  Lab 12/11/23 0438 12/11/23 1802 12/12/23 1005 12/13/23 1200 12/15/23 0957  NA 136 137 138 135 139  K 3.6 4.1 4.0 3.7 4.3  CL 101 102 103 101 105  CO2 24 22 23  21* 21*  GLUCOSE 160* 164* 168* 175* 131*  BUN 66* 61* 65* 70* 62*  CREATININE 2.41* 2.47* 2.59* 2.57* 2.30*  CALCIUM  9.7 9.6 9.9 9.8 10.3   GFR: Estimated Creatinine Clearance: 31.3 mL/min (A) (by C-G formula based on SCr of 2.3 mg/dL  (H)). Liver Function Tests: Recent Labs  Lab 12/11/23 0438 12/11/23 1802 12/12/23 1005 12/13/23 1200 12/15/23 0957  AST 136* 118* 118* 86* 61*  ALT 182* 177* 177* 137* 91*  ALKPHOS 93 91 98 87 76  BILITOT 1.0 0.8 0.8 0.5 0.8  PROT 6.9 7.1 7.6 6.5 7.1  ALBUMIN  2.3* 2.3* 2.5* 2.2* 3.1*   No results for input(s): LIPASE, AMYLASE in the last 168 hours. No results for input(s): AMMONIA in the last 168 hours. Coagulation Profile: Recent Labs  Lab 12/10/23 2327  INR 1.3*   Cardiac Enzymes: Recent Labs  Lab 12/10/23 2327  CKTOTAL 482*    CBG: Recent Labs  Lab 12/11/23 0754  GLUCAP 163*   L Sepsis Labs: Recent Labs  Lab 12/11/23 0029  LATICACIDVEN 1.2    Recent Results (from the past 240 hours)  Blood Culture (routine x 2)     Status: None   Collection Time: 12/10/23 11:33 PM   Specimen: BLOOD  Result Value Ref Range Status   Specimen Description BLOOD SITE NOT SPECIFIED  Final   Special Requests   Final    BOTTLES DRAWN AEROBIC AND ANAEROBIC Blood Culture results may not be optimal due to an inadequate volume of blood received in culture bottles   Culture   Final    NO GROWTH 5 DAYS Performed at [facility], [address], [place], [place] [address]    Report Status 12/16/2023 FINAL  Final  Blood Culture (routine x 2)     Status: None   Collection Time: 12/11/23 12:46 AM   Specimen: BLOOD  Result Value Ref Range Status   Specimen Description BLOOD BLOOD RIGHT ARM  Final   Special Requests   Final    BOTTLES DRAWN AEROBIC AND ANAEROBIC Blood Culture adequate volume   Culture   Final    NO GROWTH 5 DAYS Performed at [facility], [address], [place], [place] [address]    Report Status 12/16/2023 FINAL  Final  Resp panel by RT-PCR (RSV, Flu A&B,  Covid) Anterior Nasal Swab     Status: None   Collection Time: 12/11/23  1:15 AM   Specimen: Anterior Nasal Swab  Result Value Ref Range Status   SARS Coronavirus 2 by RT PCR NEGATIVE  NEGATIVE Final   Influenza A by PCR NEGATIVE NEGATIVE Final   Influenza B by PCR NEGATIVE NEGATIVE Final    Comment: (NOTE) The Xpert Xpress SARS-CoV-2/FLU/RSV plus assay is intended as an aid in the diagnosis of influenza from Nasopharyngeal swab specimens and should not be used as a sole basis for treatment. Nasal washings and aspirates are unacceptable for Xpert Xpress SARS-CoV-2/FLU/RSV testing.  Fact Sheet for Patients: BloggerCourse.com  Fact Sheet for Healthcare Providers: SeriousBroker.it  This test is not yet approved or cleared by the [place]  FDA and has been authorized for detection and/or diagnosis of SARS-CoV-2 by FDA under an Emergency Use Authorization (EUA). This EUA will remain in effect (meaning this test can be used) for the duration of the COVID-19 declaration under Section 564(b)(1) of the Act, 21 U.S.C. section 360bbb-3(b)(1), unless the authorization is terminated or revoked.     Resp Syncytial Virus by PCR NEGATIVE NEGATIVE Final    Comment: (NOTE) Fact Sheet for Patients: BloggerCourse.com  Fact Sheet for Healthcare Providers: SeriousBroker.it  This test is not yet approved or cleared by the [place]  FDA and has been authorized for detection and/or diagnosis of SARS-CoV-2 by FDA under an Emergency Use Authorization (EUA). This EUA will remain in effect (meaning this test can be used) for the duration of the COVID-19 declaration under Section 564(b)(1) of the Act, 21 U.S.C. section 360bbb-3(b)(1), unless the authorization is terminated or revoked.  Performed at [facility], [address], [place], [place] [address]       Imaging Studies   No results found.    Medications   Scheduled Meds:  aspirin  EC  81 mg Oral Daily   diltiazem   180 mg Oral Daily   dorzolamide   1 drop Both Eyes BID   enoxaparin  (LOVENOX ) injection  30  mg Subcutaneous Daily   furosemide   40 mg Oral BID   latanoprost   1 drop Both Eyes QHS   levothyroxine   25 mcg Oral Once per day on Monday Tuesday Wednesday Thursday Friday   multivitamin with minerals  1 tablet Oral Daily   tamsulosin   0.4 mg Oral Daily   Continuous Infusions:     LOS: 5 days   Time spent: 65 minutes  SIGNED: Adriana DELENA Grams, MD, FHM. FAAFP [facility],  Pager (please use [email or web address] to page/text)  Please use Epic Secure Chat for non-urgent communication (7AM-7PM) If 7PM-7AM, please contact night-coverage [email or web address],  12/16/2023, 11:11 AM

## 2023-12-16 NOTE — Progress Notes (Signed)
 Arrived to find the PT in his wheelchair, no obvious distress noted. PT had completed a BM and self reports that it was still loose but not as bad as yesterday. PT denied any pain or SOB at this time.   Physical exam showed C/E L/S in all fields. Heart tones were normal. ABD is soft, non-tender in all fields. PT denied any issues with urination. Left leg is noted to have +2 pitting pedal edema. TED hose are in place. Vitals were obtained and documented accordingly. Medications were administered as prescribed. IV blood draw was ordered, however unsuccessful by paramedic Daizy Outen.   MD Shahmehdi had virtual visit with PT and explained the findings of his blood work and the plan moving forward. MD stated that EDD will be 12/18/23 following lab work results taken that day. PT understood and did not have any other questions.   PT was transported via [employer] wheelchair fleeta to the hospital where labs were drawn. PT was returned home without incident.

## 2023-12-16 NOTE — Progress Notes (Signed)
 Paramedic Micheal Walls spoke with PT's daughter, Micheal Walls, at the PT's request. PT's daughter was told of the MD's plan of care and discussed home health care.   PT's daughter stated that the help that is coming to the house is through the county and they are there 2 days for her father and the other days for her mother. She expressed that they were not helping the PT in the way that she thought they would. PT's daughter stated that at the end of 2023 PT was in rehab and was walking with the assistance of a walker. He stopped doing so in 2024 and has been wheelchair bound since. PT's family has been talking to him about either going back into rehab to get stronger or allowing other help to come into the house. She expressed that she would love to see her father up and walking around again and believes that he would like that as well. Daughter stated that multiple family members have talked to him about the help and he has been stubborn to it. PT's daughter is wanting to know about any other resources that are available to her father through their insurance.   Paramedic Micheal Walls spoke with the PT at the PM visit about home health. PT expressed that he does want to be able to ambulate with a  walker as before but states his prosthetic leg on his right leg is too big and it makes it difficult to put weight on it. PT was okay with pt coming to speak with him and possible exercised that can be done to strengthen his leg and improve balance. PT stated that he didn't want to be in a wheelchair at the end of his life and wants some of his independence back.

## 2023-12-16 NOTE — Plan of Care (Signed)
  Problem: Health Behavior/Discharge Planning: Goal: Ability to identify and utilize available resources and services will improve Outcome: Progressing   Problem: Nutritional: Goal: Maintenance of adequate nutrition will improve Outcome: Progressing   Problem: Skin Integrity: Goal: Risk for impaired skin integrity will decrease Outcome: Progressing   Problem: Activity: Goal: Ability to tolerate increased activity will improve Outcome: Progressing   Problem: Respiratory: Goal: Ability to maintain adequate ventilation will improve Description: Patient provided PRN Neb treatment and spirometer use to lossen secretions Outcome: Adequate for Discharge Goal: Ability to maintain a clear airway will improve Outcome: Adequate for Discharge

## 2023-12-16 NOTE — Plan of Care (Signed)
  Problem: Respiratory: Goal: Ability to maintain adequate ventilation will improve Description: Patient provided PRN Neb treatment and spirometer use to lossen secretions Outcome: Progressing Goal: Ability to maintain a clear airway will improve Outcome: Progressing   Problem: Fluid Volume: Goal: Ability to maintain a balanced intake and output will improve Outcome: Progressing   Problem: Nutritional: Goal: Maintenance of adequate nutrition will improve Outcome: Progressing   Problem: Skin Integrity: Goal: Risk for impaired skin integrity will decrease Outcome: Progressing

## 2023-12-16 NOTE — Plan of Care (Signed)
Progressing well.

## 2023-12-16 NOTE — TOC Progression Note (Addendum)
 Transition of Care [facility]) - Progression Note    Patient Details  Name: Micheal Walls MRN: [id number] Date of Birth: [birth year]  [facility] CM/SW Contact  Corean JAYSON Canary, RN Phone Number: 12/16/2023, 4:18 PM  Clinical Narrative:    4pm ID rounds done Paramedic Orie stated patient is thinking about Home health. Called [name] and left a message to call this RNCM back to discuss Last had [facility]  in 2022 for just a few days, previous to that had [facility] in 2021. Spoke to daughter Neville, she states she is looking forward to him being more ambulatory and not relying on wheelchairs so much. She states Hedda had a very good therapist, would prefer male as this seems to be better and more motivating for him.  Messaged Darleene Gowda with request and disciplines. Hedda accepted.Sent Darleene Purchase number to schedule with her TOC will continue to follow possible DC Sunday or Monday   Expected Discharge Plan: Home w Home Health Services ([facility]) Barriers to Discharge: Continued Medical Work up  Expected Discharge Plan and Services In-house Referral: PCP / Health Connect   Post Acute Care Choice: Home Health Living arrangements for the past 2 months: Single Family Home                           HH Arranged: Refused HH           Social Determinants of Health (SDOH) Interventions SDOH Screenings   Food Insecurity: No Food Insecurity (12/11/2023)  Housing: High Risk (12/11/2023)  Transportation Needs: Unmet Transportation Needs (12/11/2023)  Utilities: Not At Risk (12/11/2023)  Alcohol  Screen: Low Risk  (06/02/2023)  Depression (PHQ2-9): Low Risk  (06/02/2023)  Financial Resource Strain: Low Risk  (08/20/2023)  Physical Activity: Inactive (12/11/2023)  Social Connections: Moderately Integrated (12/11/2023)  Stress: No Stress Concern Present (08/20/2023)  Tobacco Use: Medium Risk (12/12/2023)  Health Literacy: Adequate Health  Literacy (12/11/2023)    Readmission Risk Interventions    12/11/2023   11:10 AM  Readmission Risk Prevention Plan  Transportation Screening Complete  PCP or Specialist Appt within 5-7 Days Complete  Home Care Screening Complete  Medication Review (RN CM) Complete

## 2023-12-16 NOTE — Progress Notes (Addendum)
 1950---Contact call made to patient and spouse to inform name of night RN, assessment, and review of plan for night time medications reviewed. Patient to receive call between 9:30-945. [facility] contact information also review. Patient confirmed he is stable and the [facility] paramedic recently left, current health data also within normal parameters.   2145-- Contact all via Current health video. Patient identifiers reviewed.  Family confirmed patient sleeping for the night and  bedtime eye drops were already administered. Family encouraged to contact [facility] RN for support with medication administration. Family confirmed patient doing well and informed RN will continue to monitor current health data throughout the night will contact as needed.  Family encouraged to call as needed.

## 2023-12-17 NOTE — Discharge Summary (Addendum)
 [facility]   Patient: Micheal Walls MRN: [id number] DOB: October 07, 1935  Admit date:     12/10/2023  Discharge date: 12/17/23  Discharge Physician: Micheal Walls   PCP: Micheal Speaks, [name]   Recommendations at discharge:   Follow up with PCP and Nephrologist in 1-2 weeks  CBC and CMP in 5-7 days- results to PCP and nephrologist (to follow-up and monitor kidney function and LFTs)  Continue currently recommended medications Medications are subject to change and modified by PCP and nephrologist Continue home health with PT OT Completed course of antibiotic, medication change Diltiazem  CD2 140 to 180 mg p.o. daily - to hold Allopurinol  and Crestor   Discharge Diagnoses: Principal Problem:   PNA (pneumonia) Active Problems:   Essential hypertension   OSA on CPAP   Morbid obesity (HCC)   History of stroke   Acute renal failure superimposed on stage 4 chronic kidney disease (HCC)   Chronic diastolic (congestive) heart failure (HCC)   MGUS (monoclonal gammopathy of unknown significance)   Hypothyroidism   S/P unilateral BKA (below knee amputation) (HCC)   PAD (peripheral artery disease) (HCC)   Type 2 diabetes mellitus with stage 4 chronic kidney disease, with long-term current use of insulin  (HCC)   Elevated transaminase level   Swelling of left lower extremity  Resolved Problems:   Pneumonia  Hospital Course: [name] is an 88 y.o. male with medical history significant for hypertension, type 2 diabetes mellitus, OSA, PAD, CVA, hypothyroidism, chronic HFpEF, and MGUS who presents with fatigue, malaise, loss of appetite, and cough.   Patient began to feel generally poor on 12/08/2023.  He has been mainly staying in bed since then with general malaise and cough.  He has not felt particularly short of breath but has not been very active at all.  He also reports a loss of appetite but denies abdominal pain or vomiting.  He reports  that his urine volume seems to have decreased over the past day or 2 but denies dysuria.   ED Course: Upon arrival to the ED, patient is found to be afebrile and saturating mid 90s on room air with tachypnea, mild tachycardia, and stable blood pressure.  Labs are most notable for creatinine 2.78, albumin  2.6, AST 141, ALT 198, normal WBC, and normal lactic acid.  Chest x-ray is concerning for right upper lobe pneumonia.   Blood cultures were collected and the patient was given a liter of LR, Rocephin , and azithromycin .  Covid/influenza A/influenza B/RSV PCR were negative.    * PNA (pneumonia)/right lower lobe pneumonia -Much improved, satting 96% room air -Completed a 5-day course of antibiotics Rocephin /azithromycin  - Improved cough with current medication   POA: weakness, fatigue and cough on initial presentation with noted RUL pneumonia on imaging 12/10/22      -Blood cultures-no growth to date   Remained afebrile, mildly hypotensive, with no leukocytosis     Acute renal failure superimposed on stage 4 chronic kidney disease (HCC) -Improving kidney function, monitor closely -Avoid nephrotoxins -Unfortunately we need to resume Lasix  40 mg twice daily due to his redeveloping left lower extremity edema   - He is to follow-up with his nephrologist on August 21.2025  - Will monitor kidney function today and tomorrow   Monitor eGFR: 26, 25, 24>> 27   - S/p IV fluid resuscitation: Since admission: Status post LR 1 L bolus at midnight 30 on 12/11/23 and LR IVF at 100 ml/hr, received another NS  250 ml bolus + 500 mL normal saline  12/13/2023 -Discontinued all IVF -Monitor BUN: As high as 70 >> 62   Lab Results  Component Value Date   CREATININE 2.10 (H) 12/16/2023   CREATININE 2.30 (H) 12/15/2023   CREATININE 2.57 (H) 12/13/2023    - Resuming p.o. Lasix    Hypoalbuminemia:   S/P 25 g of IV albumin + 50 g IV albumin   followed by 40 mg of Lasix  on 7/3     Morbid obesity  (HCC) Body mass index is 34.96 kg/m. This complicates overall care and prognosis.    OSA on CPAP Continue CPAP qhs   Essential hypertension - Blood pressure has improved and stabilized Home diltiazem  CD240 mg was reduced to 180 mg daily due to hypotension - Resume Lasix  40 mg p.o. twice daily   Hypothyroidism - Home levothyroxine  25 mcg daily   MGUS (monoclonal gammopathy of unknown significance) Appears at baseline, continue outpatient followup as appropriate   Chronic diastolic (congestive) heart failure (HCC) At baseline -S/p gentle IV fluid hydration -Last echo 07/19/2020: EF 55-60% moderate left ventricular hypertrophy.  Indeterminate diastolic filling due  to E-A fusion.  -Monitoring daily weight difficult to do so due to his prosthesis, severe debility - BNP, 48,>>>  60.7 >>> 99.4  - Continue have lower extremity edema, +1     Swelling of left lower extremity -+1 pitting edema-close to baseline Resuming- furosemide  40 mg twice daily   Elevated transaminase level -likely due to infection, hypertension, improving       Latest Ref Rng & Units 12/15/2023    9:57 AM 12/13/2023   12:00 PM 12/12/2023   10:05 AM  Hepatic Function  Total Protein 6.5 - 8.1 g/dL 7.1  6.5  7.6   Albumin  3.5 - 5.0 g/dL 3.1  2.2  2.5   AST 15 - 41 U/L 61  86  118   ALT 0 - 44 U/L 91  137  177   Alk Phosphatase 38 - 126 U/L 76  87  98   Total Bilirubin 0.0 - 1.2 mg/dL 0.8  0.5  0.8     - Improving, monitor - Holding hepatotoxic including statins     Type 2 diabetes mellitus with stage 4 chronic kidney disease, with long-term current use of insulin  (HCC) Goal CBG 140-180 Continue home medications and current monitoring   PAD (peripheral artery disease) (HCC) Continue home asa 81 mg daily           Disposition Plan & Communication     Exp d/c date: 12/17/2023-monitor kidney function and LFTs Medically stable for d/c: Home health has been arranged for PT OT   Family Communication: no,  patient states spouse has mild dementia    Consults, Procedures, Significant Events    Consultants:  PT   Antimicrobials:  Azithromycin  500 mg PO, to completed 5 doses Ceftriaxone  2 g IV, daily, to completed 5 day course     Discussed discharge summary with the patient in detail and follow-up with nephrology and PCP.  Patient expressed understanding of the plan.   He expresses satisfaction with [facility].       Disposition: Home Diet recommendation:  Discharge Diet Orders (From admission, onward)     Start     Ordered   12/15/23 0000  Diet - low sodium heart healthy        12/15/23 1004           Cardiac diet DISCHARGE MEDICATION: Allergies as of  12/17/2023       Reactions   Vancomycin  Rash        Medication List     PAUSE taking these medications    allopurinol  100 MG tablet Wait to take this until: December 19, 2023 Commonly known as: ZYLOPRIM  Take 100 mg by mouth daily.   rosuvastatin  20 MG tablet Wait to take this until: December 22, 2023 Commonly known as: CRESTOR  TAKE 1 TABLET (20MG  TOTAL) BY MOUTH AT BEDTIME What changed: See the new instructions.       TAKE these medications    aspirin  EC 81 MG tablet Take 81 mg by mouth daily. Swallow whole.   colchicine  0.6 MG tablet Take 0.6 mg by mouth once a week.   diltiazem  180 MG 24 hr capsule Commonly known as: CARDIZEM  CD Take 1 capsule (180 mg total) by mouth daily. What changed:  medication strength how much to take   dorzolamide  2 % ophthalmic solution Commonly known as: TRUSOPT  Place 1 drop into both eyes 2 (two) times daily.   ferrous sulfate  325 (65 FE) MG tablet Take 1 tablet (325 mg total) by mouth 2 (two) times daily with a meal. What changed: when to take this   FreeStyle Libre 14 Day Sensor Misc Use as directed to check blood sugars   FreeStyle Precision Neo Test test strip Generic drug: glucose blood Use as instructed   furosemide  40 MG tablet Commonly known  as: LASIX  TAKE ONE (1) TABLET BY MOUTH TWICE DAILY *REFILL REQUEST*   gabapentin  100 MG capsule Commonly known as: NEURONTIN  TAKE TWO (2) CAPSULES BY MOUTH IN THE MORNING, TAKE 1 CAPSULE AT NOON AND 1 CAPSULE EVERY EVENING *REFILL REQUEST*   guaiFENesin  100 MG/5ML liquid Commonly known as: ROBITUSSIN Take 10 mLs by mouth every 8 (eight) hours as needed for cough or to loosen phlegm.   latanoprost  0.005 % ophthalmic solution Commonly known as: XALATAN  Place 1 drop into both eyes at bedtime.   Ozempic  (2 MG/DOSE) 8 MG/3ML Sopn Generic drug: Semaglutide  (2 MG/DOSE) Inject 2 mg into the skin once a week.   pantoprazole  40 MG tablet Commonly known as: PROTONIX  TAKE 1 TABLET BY MOUTH EVERY DAY AT BEDTIME *REFILL REQUEST*   PreserVision AREDS Caps Take 2 capsules by mouth daily. What changed: Another medication with the same name was removed. Continue taking this medication, and follow the directions you see here.   senna-docusate 8.6-50 MG tablet Commonly known as: Senokot-S Take 1 tablet by mouth 2 (two) times daily between meals as needed for mild constipation or moderate constipation.   Synthroid  25 MCG tablet Generic drug: levothyroxine  Take 1 tablet by mouth Monday - Friday   tamsulosin  0.4 MG Caps capsule Commonly known as: FLOMAX  Take 0.4 mg by mouth daily.        Follow-up Information     [facility] Follow up.   Specialty: Home Health Services Why: They will call daughter Neville  to schedule 24-48 hours after discharge Contact information: [address] [place] [address] [phone]         Micheal Speaks, [name] Follow up.   Specialty: General Practice Why: Follow up 7-10 days Contact information: [address] [place] [address] [phone]                Discharge Exam: Fredricka Weights   12/11/23 0151 12/11/23 1452  Weight: 123 kg 120.2 kg    Physical exam visualized, and physically performed  by paramedic  Lauraine Faes   General:  AAO x 3,  cooperative, no distress;   HEENT:  Normocephalic, PERRL, otherwise with in Normal limits   Neuro:  CNII-XII intact. ,  Reported:  normal motor and sensation, reflexes intact   Lungs:   Reported; clear to auscultation BL, Respirations unlabored,  No wheezes / crackles  Cardio:    Reported; S1/S2, RRR, No murmure, No Rubs or Gallops   Abdomen:  Reported; soft, non-tender, bowel sounds active all four quadrants, no guarding or peritoneal signs.  Muscular  skeletal:  Right lower extremity BKA, with prosthesis  Limited exam -global generalized weaknesses - in bed, able to move all 4 extremities,   2+ pulses,  symmetric, +1  pitting edema  Skin:  Dry, warm to touch, negative for any Rashes,  Wounds: Please see nursing documentation            Condition at discharge: good  The results of significant diagnostics from this hospitalization (including imaging, microbiology, ancillary and laboratory) are listed below for reference.   Imaging Studies: DG Chest 2 View Result Date: 12/10/2023 CLINICAL DATA:  Fall weakness and urinary frequency, unable to get out of bed x2 days. EXAM: CHEST - 2 VIEW COMPARISON:  Chest AP Lat 10/21/2020 FINDINGS: Low inspiration on exam with elevated right hemidiaphragm. There is a patchy airspace infiltrate in the right upper lobe consistent with pneumonia in this clinical setting. Rest of the hypoinflated lungs are generally clear apart from linear atelectasis over the elevated right diaphragm. No pleural effusion is seen.  The cardiac size is normal. The mediastinum is normally outlined, with aortic atherosclerosis. Moderate thoracic spondylosis. IMPRESSION: 1. Patchy airspace infiltrate in the right upper lobe consistent with pneumonia in this clinical setting. Follow-up chest x-ray recommended to ensure resolution. 2. Low inspiration with elevated right hemidiaphragm. 3. Aortic atherosclerosis. Electronically Signed    By: Francis Quam M.D.   On: 12/10/2023 23:24    Microbiology: Results for orders placed or performed during the hospital encounter of 12/10/23  Blood Culture (routine x 2)     Status: None   Collection Time: 12/10/23 11:33 PM   Specimen: BLOOD  Result Value Ref Range Status   Specimen Description BLOOD SITE NOT SPECIFIED  Final   Special Requests   Final    BOTTLES DRAWN AEROBIC AND ANAEROBIC Blood Culture results may not be optimal due to an inadequate volume of blood received in culture bottles   Culture   Final    NO GROWTH 5 DAYS Performed at [facility], [address] [place], [place] [address]    Report Status 12/16/2023 FINAL  Final  Blood Culture (routine x 2)     Status: None   Collection Time: 12/11/23 12:46 AM   Specimen: BLOOD  Result Value Ref Range Status   Specimen Description BLOOD BLOOD RIGHT ARM  Final   Special Requests   Final    BOTTLES DRAWN AEROBIC AND ANAEROBIC Blood Culture adequate volume   Culture   Final    NO GROWTH 5 DAYS Performed at [facility], [address], [place], [place] [address]    Report Status 12/16/2023 FINAL  Final  Resp panel by RT-PCR (RSV, Flu A&B, Covid) Anterior Nasal Swab     Status: None   Collection Time: 12/11/23  1:15 AM   Specimen: Anterior Nasal Swab  Result Value Ref Range Status   SARS Coronavirus 2 by RT PCR NEGATIVE NEGATIVE Final   Influenza A by  PCR NEGATIVE NEGATIVE Final   Influenza B by PCR NEGATIVE NEGATIVE Final    Comment: (NOTE) The Xpert Xpress SARS-CoV-2/FLU/RSV plus assay is intended as an aid in the diagnosis of influenza from Nasopharyngeal swab specimens and should not be used as a sole basis for treatment. Nasal washings and aspirates are unacceptable for Xpert Xpress SARS-CoV-2/FLU/RSV testing.  Fact Sheet for Patients: BloggerCourse.com  Fact Sheet for Healthcare Providers: SeriousBroker.it  This test is not yet approved  or cleared by the [place]  FDA and has been authorized for detection and/or diagnosis of SARS-CoV-2 by FDA under an Emergency Use Authorization (EUA). This EUA will remain in effect (meaning this test can be used) for the duration of the COVID-19 declaration under Section 564(b)(1) of the Act, 21 U.S.C. section 360bbb-3(b)(1), unless the authorization is terminated or revoked.     Resp Syncytial Virus by PCR NEGATIVE NEGATIVE Final    Comment: (NOTE) Fact Sheet for Patients: BloggerCourse.com  Fact Sheet for Healthcare Providers: SeriousBroker.it  This test is not yet approved or cleared by the [place]  FDA and has been authorized for detection and/or diagnosis of SARS-CoV-2 by FDA under an Emergency Use Authorization (EUA). This EUA will remain in effect (meaning this test can be used) for the duration of the COVID-19 declaration under Section 564(b)(1) of the Act, 21 U.S.C. section 360bbb-3(b)(1), unless the authorization is terminated or revoked.  Performed at [facility], [address] [place], [place] [address]     Labs: CBC: Recent Labs  Lab 12/10/23 2327 12/11/23 0438 12/12/23 2109 12/13/23 1200  WBC 9.8 9.3 8.3 8.7  HGB 11.2* 10.9* 9.9* 10.3*  HCT 34.3* 33.5* 29.6* 30.6*  MCV 86.2 86.3 86.8 84.8  PLT 324 317 295 334   Basic Metabolic Panel: Recent Labs  Lab 12/11/23 1802 12/12/23 1005 12/13/23 1200 12/15/23 0957 12/16/23 1409  NA 137 138 135 139 139  K 4.1 4.0 3.7 4.3 3.9  CL 102 103 101 105 102  CO2 22 23 21* 21* 23  GLUCOSE 164* 168* 175* 131* 152*  BUN 61* 65* 70* 62* 57*  CREATININE 2.47* 2.59* 2.57* 2.30* 2.10*  CALCIUM  9.6 9.9 9.8 10.3 10.4*   Liver Function Tests: Recent Labs  Lab 12/11/23 0438 12/11/23 1802 12/12/23 1005 12/13/23 1200 12/15/23 0957  AST 136* 118* 118* 86* 61*  ALT 182* 177* 177* 137* 91*  ALKPHOS 93 91 98 87 76  BILITOT 1.0 0.8 0.8 0.5 0.8  PROT  6.9 7.1 7.6 6.5 7.1  ALBUMIN  2.3* 2.3* 2.5* 2.2* 3.1*   CBG: Recent Labs  Lab 12/11/23 0754  GLUCAP 163*    Discharge time spent: greater than 30 minutes.  Signed: Adriana DELENA Grams, MD [facility] 12/17/2023

## 2023-12-17 NOTE — TOC Transition Note (Addendum)
 Transition of Care [facility]) - Discharge Note   Patient Details  Name: Micheal Walls MRN: [id number] Date of Birth: Feb 23, 1936  [facility] CM/SW Contact:  Corean JAYSON Canary, RN Phone Number: 12/17/2023, 12:25 PM   Clinical Narrative:    Patient discharging today,  Darleene from [place] aware They are starting home health tomorrow. No further needs identified. TOC signing off   Final next level of care: Home w Home Health Services Barriers to Discharge: No Barriers Identified   Patient Goals and CMS Choice Patient states their goals for this hospitalization and ongoing recovery are:: to go home to continue caring for his wife CMS Medicare.gov Compare Post Acute Care list provided to:: Patient        Discharge Placement                       Discharge Plan and Services Additional resources added to the After Visit Summary for   In-house Referral: PCP / Health Connect   Post Acute Care Choice: Home Health                    HH Arranged: Refused HH          Social Drivers of Health (SDOH) Interventions SDOH Screenings   Food Insecurity: No Food Insecurity (12/11/2023)  Housing: High Risk (12/11/2023)  Transportation Needs: Unmet Transportation Needs (12/11/2023)  Utilities: Not At Risk (12/11/2023)  Alcohol  Screen: Low Risk  (06/02/2023)  Depression (PHQ2-9): Low Risk  (06/02/2023)  Financial Resource Strain: Low Risk  (08/20/2023)  Physical Activity: Inactive (12/11/2023)  Social Connections: Moderately Integrated (12/11/2023)  Stress: No Stress Concern Present (08/20/2023)  Tobacco Use: Medium Risk (12/12/2023)  Health Literacy: Adequate Health Literacy (12/11/2023)     Readmission Risk Interventions    12/11/2023   11:10 AM  Readmission Risk Prevention Plan  Transportation Screening Complete  PCP or Specialist Appt within 5-7 Days Complete  Home Care Screening Complete  Medication Review (RN CM) Complete

## 2023-12-17 NOTE — Progress Notes (Signed)
 Current Health Alert: Hypoxia: Patient confirmed breathing without SOB, distress or discomfort. Denies need for emergent care. CPAP will be applied.

## 2023-12-17 NOTE — Progress Notes (Signed)
 Patient appears comfortable sitting on the bed. No complaints of pain or discomfort. AVS form given. Discharge teaching provided. Pt agreeable to discharge plan. Neville Barefoot (daughter) made aware of discharge from [facility].

## 2023-12-18 ENCOUNTER — Ambulatory Visit: Payer: Self-pay | Admitting: Nurse Practitioner

## 2023-12-18 ENCOUNTER — Telehealth: Payer: Self-pay | Admitting: *Deleted

## 2023-12-18 DIAGNOSIS — Z89511 Acquired absence of right leg below knee: Secondary | ICD-10-CM | POA: Diagnosis not present

## 2023-12-18 DIAGNOSIS — I110 Hypertensive heart disease with heart failure: Secondary | ICD-10-CM | POA: Diagnosis not present

## 2023-12-18 DIAGNOSIS — I69398 Other sequelae of cerebral infarction: Secondary | ICD-10-CM | POA: Diagnosis not present

## 2023-12-18 DIAGNOSIS — I700 Atherosclerosis of aorta: Secondary | ICD-10-CM | POA: Diagnosis not present

## 2023-12-18 DIAGNOSIS — Z6834 Body mass index (BMI) 34.0-34.9, adult: Secondary | ICD-10-CM | POA: Diagnosis not present

## 2023-12-18 DIAGNOSIS — N184 Chronic kidney disease, stage 4 (severe): Secondary | ICD-10-CM | POA: Diagnosis not present

## 2023-12-18 DIAGNOSIS — N179 Acute kidney failure, unspecified: Secondary | ICD-10-CM | POA: Diagnosis not present

## 2023-12-18 DIAGNOSIS — I5032 Chronic diastolic (congestive) heart failure: Secondary | ICD-10-CM | POA: Diagnosis not present

## 2023-12-18 DIAGNOSIS — E039 Hypothyroidism, unspecified: Secondary | ICD-10-CM | POA: Diagnosis not present

## 2023-12-18 DIAGNOSIS — D472 Monoclonal gammopathy: Secondary | ICD-10-CM | POA: Diagnosis not present

## 2023-12-18 DIAGNOSIS — J189 Pneumonia, unspecified organism: Secondary | ICD-10-CM | POA: Diagnosis not present

## 2023-12-18 DIAGNOSIS — Z9181 History of falling: Secondary | ICD-10-CM | POA: Diagnosis not present

## 2023-12-18 DIAGNOSIS — E1151 Type 2 diabetes mellitus with diabetic peripheral angiopathy without gangrene: Secondary | ICD-10-CM | POA: Diagnosis not present

## 2023-12-18 DIAGNOSIS — Z7985 Long-term (current) use of injectable non-insulin antidiabetic drugs: Secondary | ICD-10-CM | POA: Diagnosis not present

## 2023-12-18 DIAGNOSIS — R29898 Other symptoms and signs involving the musculoskeletal system: Secondary | ICD-10-CM | POA: Diagnosis not present

## 2023-12-18 DIAGNOSIS — Z7982 Long term (current) use of aspirin: Secondary | ICD-10-CM | POA: Diagnosis not present

## 2023-12-18 DIAGNOSIS — E1122 Type 2 diabetes mellitus with diabetic chronic kidney disease: Secondary | ICD-10-CM | POA: Diagnosis not present

## 2023-12-18 DIAGNOSIS — G4733 Obstructive sleep apnea (adult) (pediatric): Secondary | ICD-10-CM | POA: Diagnosis not present

## 2023-12-18 NOTE — Transitions of Care (Post Inpatient/ED Visit) (Signed)
 12/18/2023  Name: Micheal Walls MRN: [id number] DOB: 1936/01/21  Today's TOC FU Call Status: Today's TOC FU Call Status:: Successful TOC FU Call Completed TOC FU Call Complete Date: 12/18/23 Patient's Name and Date of Birth confirmed.  Transition Care Management Follow-up Telephone Call Date of Discharge: 12/17/23 Discharge Facility: [facility]) [facility]) Type of Discharge: Inpatient Admission Primary Inpatient Discharge Diagnosis:: PNA (pneumonia) How have you been since you were released from the hospital?: Better Any questions or concerns?: No  Items Reviewed: Did you receive and understand the discharge instructions provided?: Yes Medications obtained,verified, and reconciled?: Yes (Medications Reviewed) Any new allergies since your discharge?: No Dietary orders reviewed?: Yes Type of Diet Ordered:: low sodium heart healthy Do you have support at home?: Yes People in Home [RPT]: alone Name of Support/Comfort Primary Source: [name]  Medications Reviewed Today: Medications Reviewed Today     Reviewed by Lucky Andrea LABOR, RN (Registered Nurse) on 12/18/23 at 1446  Med List Status: <None>   Medication Order Taking? Sig Documenting Provider Last Dose Status Informant  allopurinol  (ZYLOPRIM ) 100 MG tablet [id number]  Take 100 mg by mouth daily.  Patient not taking: Reported on 12/18/2023   [provider]  Active Pharmacy Records, Self           Med Note [name] Oct 22, 2020  9:51 AM)    aspirin  EC 81 MG tablet [id number] Yes Take 81 mg by mouth daily. Swallow whole. [provider]  Active Pharmacy Records, Self  colchicine  0.6 MG tablet [id number] Yes Take 0.6 mg by mouth once a week. [provider]  Active Pharmacy Records, Self           Med Note (MARROW, [name]   Mon Dec 11, 2023 10:09 AM) [name]   Continuous Glucose Sensor  (FREESTYLE LIBRE 14 DAY SENSOR) [name] [id number] Yes Use as directed to check blood sugars [name], [name]  Active Self, Pharmacy Records  diltiazem  (CARDIZEM  CD) 180 MG 24 hr capsule [id number] Yes Take 1 capsule (180 mg total) by mouth daily. [name], MD  Active   dorzolamide  (TRUSOPT ) 2 % ophthalmic solution [id number] Yes Place 1 drop into both eyes 2 (two) times daily. [provider]  Active Pharmacy Records, Self  ferrous sulfate  325 (65 FE) MG tablet [id number] Yes Take 1 tablet (325 mg total) by mouth 2 (two) times daily with a meal. [name], [name], MD  Active Pharmacy Records, Self  furosemide  (LASIX ) 40 MG tablet [id number] Yes TAKE ONE (1) TABLET BY MOUTH TWICE DAILY *REFILL REQUESTDEWAINE [name], [name]  Active Self, Pharmacy Records  gabapentin  (NEURONTIN ) 100 MG capsule [id number] Yes TAKE TWO (2) CAPSULES BY MOUTH IN THE MORNING, TAKE 1 CAPSULE AT NOON AND 1 CAPSULE EVERY EVENING *REFILL REQUESTDEWAINE [name], [name]  Active Self, Pharmacy Records  glucose blood (FREESTYLE PRECISION NEO TEST) test strip [id number] Yes Use as instructed [name], [name]  Active Pharmacy Records, Self  guaiFENesin  [facility]) 100 MG/5ML liquid [id number] Yes Take 10 mLs by mouth every 8 (eight) hours as needed for cough or to loosen phlegm. [name], MD  Active   latanoprost  (XALATAN ) 0.005 % ophthalmic solution [id number] Yes Place 1 drop into both eyes at bedtime.  [provider]  Active Pharmacy Records, Self           Med Note (  SUTPHIN, CHASIE F   Thu Oct 22, 2020 10:00 AM)    Multiple Vitamins-Minerals (PRESERVISION AREDS) CAPS [id number] Yes Take 2 capsules by mouth daily. [provider]  Active Pharmacy Records, Self  pantoprazole  (PROTONIX ) 40 MG tablet [id number] Yes TAKE 1 TABLET BY MOUTH EVERY DAY AT BEDTIME *REFILL REQUESTDEWAINE [name], [name]  Active Self, Pharmacy Records  rosuvastatin  (CRESTOR ) 20 MG tablet [id number]  TAKE 1 TABLET (20MG  TOTAL) BY  MOUTH AT BEDTIME  Patient not taking: Reported on 12/18/2023   [name], Jerel, MD  Active Self, Pharmacy Records  Semaglutide , 2 MG/DOSE, (OZEMPIC , 2 MG/DOSE,) 8 MG/3ML SOPN [id number] Yes Inject 2 mg into the skin once a week. [name], [name]  Active Self, Pharmacy Records           Med Note [facility], [name] Dec 11, 2023 10:13 AM) [name]   senna-docusate (SENOKOT-S) 8.6-50 MG tablet [id number] Yes Take 1 tablet by mouth 2 (two) times daily between meals as needed for mild constipation or moderate constipation. [name], [name], MD  Active Pharmacy Records, Self  SYNTHROID  25 MCG tablet [id number] Yes Take 1 tablet by mouth Monday - Friday [name], [name]  Active Self, Pharmacy Records  tamsulosin  (FLOMAX ) 0.4 MG CAPS capsule [id number] Yes Take 0.4 mg by mouth daily. [provider]  Active Pharmacy Records, Self            Home Care and Equipment/Supplies: Were Home Health Services Ordered?: Yes Name of Home Health Agency:: [facility] Has Agency set up a time to come to your home?: No EMR reviewed for Home Health Orders: Orders present/patient has not received call (refer to CM for follow-up) (RNCM contacted [facility], Start of Care is 12/18/23(today)) Any new equipment or medical supplies ordered?: No  Functional Questionnaire: Do you need assistance with bathing/showering or dressing?: No Do you need assistance with meal preparation?: Yes (Meals on Wheels) Do you need assistance with eating?: No Do you have difficulty maintaining continence: No Do you need assistance with getting out of bed/getting out of a chair/moving?: No Do you have difficulty managing or taking your medications?: No  Follow up appointments reviewed: PCP Follow-up appointment confirmed?: Yes Date of PCP follow-up appointment?: 01/08/24 Follow-up Provider: Speaks Georgina, NP Specialist Hospital Follow-up appointment confirmed?: NA Do you need transportation to your  follow-up appointment?: No Do you understand care options if your condition(s) worsen?: Yes-patient verbalized understanding  SDOH Interventions Today    Flowsheet Row Most Recent Value  SDOH Interventions   Food Insecurity Interventions Intervention Not Indicated  Housing Interventions Intervention Not Indicated  Transportation Interventions Intervention Not Indicated  Utilities Interventions Intervention Not Indicated    Goals Addressed             This Visit's Progress    VBCI Transitions of Care (TOC) Care Plan       Problems:  Recent Hospitalization for treatment of Pneumonia Functional/Safety concern: needing better fitting Prosthetic   Goal:  Over the next 30 days, the patient will not experience hospital readmission  Interventions:  Transitions of Care: Durable Medical Equipment (DME) needs identified and provider notified Doctor Visits  - discussed the importance of doctor visits Contacted Health RN/OT/PT - contacted [facility] for SOC-RN assessment will take place today Post discharge activity limitations prescribed by provider reviewed Reviewed Signs and symptoms of infection Medication review-discussed medications on hold and when to resume  Patient Self Care Activities:  Attend all scheduled provider appointments Call  provider office for new concerns or questions  Participate in Transition of Care Program/Attend TOC scheduled calls Take medications as prescribed    Plan:  Telephone follow up appointment with care management team member scheduled for:  12/26/23 at 2:30pm        Andrea Dimes RN, BSN [place]  [facility] Institute [facility] RN Care Manager [phone]

## 2023-12-19 DIAGNOSIS — N184 Chronic kidney disease, stage 4 (severe): Secondary | ICD-10-CM | POA: Diagnosis not present

## 2023-12-19 DIAGNOSIS — N179 Acute kidney failure, unspecified: Secondary | ICD-10-CM | POA: Diagnosis not present

## 2023-12-19 DIAGNOSIS — I5032 Chronic diastolic (congestive) heart failure: Secondary | ICD-10-CM | POA: Diagnosis not present

## 2023-12-19 DIAGNOSIS — E1122 Type 2 diabetes mellitus with diabetic chronic kidney disease: Secondary | ICD-10-CM | POA: Diagnosis not present

## 2023-12-19 DIAGNOSIS — E1151 Type 2 diabetes mellitus with diabetic peripheral angiopathy without gangrene: Secondary | ICD-10-CM | POA: Diagnosis not present

## 2023-12-19 DIAGNOSIS — I110 Hypertensive heart disease with heart failure: Secondary | ICD-10-CM | POA: Diagnosis not present

## 2023-12-20 DIAGNOSIS — E1151 Type 2 diabetes mellitus with diabetic peripheral angiopathy without gangrene: Secondary | ICD-10-CM | POA: Diagnosis not present

## 2023-12-20 DIAGNOSIS — N179 Acute kidney failure, unspecified: Secondary | ICD-10-CM | POA: Diagnosis not present

## 2023-12-20 DIAGNOSIS — I5032 Chronic diastolic (congestive) heart failure: Secondary | ICD-10-CM | POA: Diagnosis not present

## 2023-12-20 DIAGNOSIS — N184 Chronic kidney disease, stage 4 (severe): Secondary | ICD-10-CM | POA: Diagnosis not present

## 2023-12-20 DIAGNOSIS — I110 Hypertensive heart disease with heart failure: Secondary | ICD-10-CM | POA: Diagnosis not present

## 2023-12-20 DIAGNOSIS — E1122 Type 2 diabetes mellitus with diabetic chronic kidney disease: Secondary | ICD-10-CM | POA: Diagnosis not present

## 2023-12-21 ENCOUNTER — Telehealth: Payer: Self-pay

## 2023-12-21 DIAGNOSIS — I5032 Chronic diastolic (congestive) heart failure: Secondary | ICD-10-CM | POA: Diagnosis not present

## 2023-12-21 DIAGNOSIS — N184 Chronic kidney disease, stage 4 (severe): Secondary | ICD-10-CM | POA: Diagnosis not present

## 2023-12-21 DIAGNOSIS — I110 Hypertensive heart disease with heart failure: Secondary | ICD-10-CM | POA: Diagnosis not present

## 2023-12-21 DIAGNOSIS — E1151 Type 2 diabetes mellitus with diabetic peripheral angiopathy without gangrene: Secondary | ICD-10-CM | POA: Diagnosis not present

## 2023-12-21 DIAGNOSIS — N179 Acute kidney failure, unspecified: Secondary | ICD-10-CM | POA: Diagnosis not present

## 2023-12-21 DIAGNOSIS — E1122 Type 2 diabetes mellitus with diabetic chronic kidney disease: Secondary | ICD-10-CM | POA: Diagnosis not present

## 2023-12-21 NOTE — Telephone Encounter (Signed)
 Copied from CRM [id number]. Topic: Clinical - Home Health Verbal Orders >> Dec 18, 2023  4:25 PM Chasity T wrote: Caller/Agency: [facility] Callback Number: [phone] Service Requested: Skilled Nursing Frequency:  2 week 1 and 1 week 3 Any new concerns about the patient? No >> Dec 21, 2023  3:09 PM Delon HERO wrote: Tonya calling from [facility] is calling to follow up on the below verbal orders please advis e

## 2023-12-22 ENCOUNTER — Telehealth: Payer: Self-pay

## 2023-12-22 NOTE — Telephone Encounter (Signed)
 Copied from CRM [id number]. Topic: General - Other >> Dec 22, 2023 11:00 AM Santiya F wrote: Reason for CRM: Tonya with [facility] is calling in because patient was in the hospital with pneumonia and because the hospital said it was resolved they cannot lead for pneumonia as the reason why they are treating patient. They will focus on congestive heart failure and monitor him for repeat pneumonia.

## 2023-12-25 ENCOUNTER — Telehealth: Payer: Self-pay

## 2023-12-25 DIAGNOSIS — N179 Acute kidney failure, unspecified: Secondary | ICD-10-CM | POA: Diagnosis not present

## 2023-12-25 DIAGNOSIS — E1151 Type 2 diabetes mellitus with diabetic peripheral angiopathy without gangrene: Secondary | ICD-10-CM | POA: Diagnosis not present

## 2023-12-25 DIAGNOSIS — N184 Chronic kidney disease, stage 4 (severe): Secondary | ICD-10-CM | POA: Diagnosis not present

## 2023-12-25 DIAGNOSIS — E1122 Type 2 diabetes mellitus with diabetic chronic kidney disease: Secondary | ICD-10-CM | POA: Diagnosis not present

## 2023-12-25 DIAGNOSIS — I5032 Chronic diastolic (congestive) heart failure: Secondary | ICD-10-CM | POA: Diagnosis not present

## 2023-12-25 DIAGNOSIS — I110 Hypertensive heart disease with heart failure: Secondary | ICD-10-CM | POA: Diagnosis not present

## 2023-12-25 NOTE — Telephone Encounter (Signed)
 Copied from CRM [id number]. Topic: Clinical - Medical Advice >> Dec 22, 2023  4:18 PM Powell HERO wrote: Reason for CRM: Debbie Child psychotherapist from [facility] [employer],  completed assessment ordered by [employer]. She states the patient is declining the recommendation and wanting to report there will be no more visits. She states she will be in touch with patients sister in regard to this. Wanted to inform office. Callback  [phone] ok to leave vm.

## 2023-12-26 ENCOUNTER — Other Ambulatory Visit: Payer: Self-pay | Admitting: *Deleted

## 2023-12-26 NOTE — Patient Instructions (Signed)
 Visit Information  Thank you for taking time to visit with me today. Please don't hesitate to contact me if I can be of assistance to you before our next scheduled telephone appointment.   Following is a copy of your care plan:   Goals Addressed             This Visit's Progress    VBCI Transitions of Care (TOC) Care Plan       Problems:  Recent Hospitalization for treatment of Pneumonia Functional/Safety concern: needing better fitting Prosthetic   Goal:  Over the next 30 days, the patient will not experience hospital readmission  Interventions:  Transitions of Care: Durable Medical Equipment (DME) needs identified and provider notified Doctor Visits  - discussed the importance of doctor visits Post discharge activity limitations prescribed by provider reviewed Reviewed Signs and symptoms of infection Medication review-patient resumed medications that were on hold Secure communication with PCP-she will address patient needing new prosthesis during visit on 01/08/24  Patient Self Care Activities:  Attend all scheduled provider appointments Call provider office for new concerns or questions  Participate in Transition of Care Program/Attend [facility] scheduled calls Take medications as prescribed    Plan:  Telephone follow up appointment with care management team member scheduled for:  01/02/24 at 2:30pm        Patient verbalizes understanding of instructions and care plan provided today and agrees to view in MyChart. Active MyChart status and patient understanding of how to access instructions and care plan via MyChart confirmed with patient.     Telephone follow up appointment with care management team member scheduled for:01/02/24 at 2:30pm  Please call the care guide team at [phone] if you need to cancel or reschedule your appointment.   Please call [phone]-TALK (toll free, 24 hour hotline) go to [facility] [address]  [place] [phone]) call 911 if you are experiencing a Mental Health or Behavioral Health Crisis or need someone to talk to.  Andrea Dimes RN, BSN [place]  Value-Based Care Institute [facility] RN Care Manager [phone]

## 2023-12-26 NOTE — Transitions of Care (Post Inpatient/ED Visit) (Signed)
 Transition of Care week 2  Visit Note  12/26/2023  Name: Micheal Walls MRN: [id number]          DOB: 07-19-1935  Situation: Patient enrolled in [facility] 30-day program. Visit completed with Micheal Walls by telephone.   Background:   Initial Transition Care Management Follow-up Telephone Call    Past Medical History:  Diagnosis Date   Acute metabolic encephalopathy 01/16/2020   Amputated below knee (HCC)    right   Anemia    Aspiration pneumonia (HCC) 11/02/2019   Cataract 2019   Chronic kidney disease 2006   Diastolic heart failure (HCC)    Diverticulosis    DM (diabetes mellitus) (HCC)    GERD (gastroesophageal reflux disease) 1995   Gout    Hyperlipemia    OSA on CPAP    RBBB    Sepsis (HCC) 11/02/2019   Sleep apnea 2000   Stroke (HCC) 2002   Systemic hypertension    Thyroid  disease 2022    Assessment: Patient Reported Symptoms: Cognitive Cognitive Status: No symptoms reported      Neurological Neurological Review of Symptoms: No symptoms reported    HEENT HEENT Symptoms Reported: No symptoms reported      Cardiovascular Cardiovascular Symptoms Reported: No symptoms reported    Respiratory Respiratory Symptoms Reported: No symptoms reported Respiratory Management Strategies: CPAP Respiratory Self-Management Outcome: 4 (good)  Endocrine Endocrine Symptoms Reported: Not assessed    Gastrointestinal Gastrointestinal Symptoms Reported: No symptoms reported      Genitourinary Genitourinary Symptoms Reported: No symptoms reported    Integumentary Integumentary Symptoms Reported: No symptoms reported    Musculoskeletal Musculoskelatal Symptoms Reviewed: Limited mobility Additional Musculoskeletal Details: utilizing wheelchair Musculoskeletal Management Strategies: Coping strategies, Exercise Musculoskeletal Self-Management Outcome: 3 (uncertain) Musculoskeletal Comment: [facility] for PT/OT twice weekly.      Psychosocial Psychosocial Symptoms Reported: No  symptoms reported         There were no vitals filed for this visit.  Medications Reviewed Today     Reviewed by Lucky Andrea LABOR, RN (Registered Nurse) on 12/26/23 at 1509  Med List Status: <None>   Medication Order Taking? Sig Documenting Provider Last Dose Status Informant  allopurinol  (ZYLOPRIM ) 100 MG tablet [id number] Yes Take 100 mg by mouth daily. [provider]  Active Pharmacy Records, Self           Med Note [name] Oct 22, 2020  9:51 AM)    aspirin  EC 81 MG tablet [id number] Yes Take 81 mg by mouth daily. Swallow whole. [provider]  Active Pharmacy Records, Self  colchicine  0.6 MG tablet [id number] Yes Take 0.6 mg by mouth once a week.  Patient taking differently: Take 0.6 mg by mouth daily as needed.   [provider]  Active Pharmacy Records, Self           Med Note (MARROW, [name]   Mon Dec 11, 2023 10:09 AM) [name]   Continuous Glucose Sensor (FREESTYLE LIBRE 14 DAY SENSOR) [name] [id number] Yes Use as directed to check blood sugars [name], [name]  Active Self, Pharmacy Records  diltiazem  (CARDIZEM  CD) 180 MG 24 hr capsule [id number] Yes Take 1 capsule (180 mg total) by mouth daily. [name], MD  Active   dorzolamide  (TRUSOPT ) 2 % ophthalmic solution [id number] Yes Place 1 drop into both eyes 2 (two) times daily. [provider]  Active Pharmacy Records, Self  ferrous sulfate  325 (65 FE) MG tablet [id number] Yes Take 1 tablet (325 mg total) by mouth 2 (two) times daily with a meal. [name], [name], MD  Active Pharmacy Records, Self  furosemide  (LASIX ) 40 MG tablet [id number] Yes TAKE ONE (1) TABLET BY MOUTH TWICE DAILY *REFILL REQUESTDEWAINE [name], [name]  Active Self, Pharmacy Records  gabapentin  (NEURONTIN ) 100 MG capsule [id number] Yes TAKE TWO (2) CAPSULES BY MOUTH IN THE MORNING, TAKE 1 CAPSULE AT NOON AND 1 CAPSULE EVERY  EVENING *REFILL REQUESTDEWAINE [name], [name]  Active Self, Pharmacy Records  glucose blood (FREESTYLE PRECISION NEO TEST) test strip [id number] Yes Use as instructed [name], [name]  Active Pharmacy Records, Self  guaiFENesin  (ROBITUSSIN) 100 MG/5ML liquid [id number]  Take 10 mLs by mouth every 8 (eight) hours as needed for cough or to loosen phlegm.  Patient not taking: Reported on 12/26/2023   [name], MD  Active   latanoprost  (XALATAN ) 0.005 % ophthalmic solution [id number] Yes Place 1 drop into both eyes at bedtime.  [provider]  Active Pharmacy Records, Self           Med Note [name] Oct 22, 2020 10:00 AM)    Multiple Vitamins-Minerals (PRESERVISION AREDS) CAPS [id number] Yes Take 2 capsules by mouth daily. [provider]  Active Pharmacy Records, Self  pantoprazole  (PROTONIX ) 40 MG tablet [id number] Yes TAKE 1 TABLET BY MOUTH EVERY DAY AT BEDTIME *REFILL REQUESTDEWAINE [name], [name]  Active Self, Pharmacy Records  rosuvastatin  (CRESTOR ) 20 MG tablet [id number] Yes TAKE 1 TABLET (20MG  TOTAL) BY MOUTH AT BEDTIME [name], [name], MD  Active Self, Pharmacy Records  Semaglutide , 2 MG/DOSE, (OZEMPIC , 2 MG/DOSE,) 8 MG/3ML SOPN [id number] Yes Inject 2 mg into the skin once a week. [name], [name]  Active Self, Pharmacy Records           Med Note [facility], [name] Dec 11, 2023 10:13 AM) [name]   senna-docusate (SENOKOT-S) 8.6-50 MG tablet [id number] Yes Take 1 tablet by mouth 2 (two) times daily between meals as needed for mild constipation or moderate constipation. [name], [name], MD  Active Pharmacy Records, Self  SYNTHROID  25 MCG tablet [id number] Yes Take 1 tablet by mouth Monday - Friday [name], [name]  Active Self, Pharmacy Records  tamsulosin  (FLOMAX ) 0.4 MG CAPS capsule [id number] Yes Take 0.4 mg by mouth daily. [provider]  Active Pharmacy Records, Self            Recommendation:    Continue Current Plan of Care  Follow Up Plan:   Telephone follow-up in 1 week  Andrea Dimes RN, BSN [place]  Value-Based Care Institute [facility] Health RN Care Manager [phone]

## 2023-12-27 ENCOUNTER — Other Ambulatory Visit: Payer: Self-pay | Admitting: Nurse Practitioner

## 2023-12-27 DIAGNOSIS — I5032 Chronic diastolic (congestive) heart failure: Secondary | ICD-10-CM | POA: Diagnosis not present

## 2023-12-27 DIAGNOSIS — N184 Chronic kidney disease, stage 4 (severe): Secondary | ICD-10-CM | POA: Diagnosis not present

## 2023-12-27 DIAGNOSIS — N179 Acute kidney failure, unspecified: Secondary | ICD-10-CM | POA: Diagnosis not present

## 2023-12-27 DIAGNOSIS — I110 Hypertensive heart disease with heart failure: Secondary | ICD-10-CM | POA: Diagnosis not present

## 2023-12-27 DIAGNOSIS — E1122 Type 2 diabetes mellitus with diabetic chronic kidney disease: Secondary | ICD-10-CM | POA: Diagnosis not present

## 2023-12-27 DIAGNOSIS — E1151 Type 2 diabetes mellitus with diabetic peripheral angiopathy without gangrene: Secondary | ICD-10-CM | POA: Diagnosis not present

## 2023-12-30 DIAGNOSIS — E1122 Type 2 diabetes mellitus with diabetic chronic kidney disease: Secondary | ICD-10-CM | POA: Diagnosis not present

## 2023-12-30 DIAGNOSIS — N179 Acute kidney failure, unspecified: Secondary | ICD-10-CM | POA: Diagnosis not present

## 2023-12-30 DIAGNOSIS — I5032 Chronic diastolic (congestive) heart failure: Secondary | ICD-10-CM | POA: Diagnosis not present

## 2023-12-30 DIAGNOSIS — I110 Hypertensive heart disease with heart failure: Secondary | ICD-10-CM | POA: Diagnosis not present

## 2023-12-30 DIAGNOSIS — N184 Chronic kidney disease, stage 4 (severe): Secondary | ICD-10-CM | POA: Diagnosis not present

## 2023-12-30 DIAGNOSIS — E1151 Type 2 diabetes mellitus with diabetic peripheral angiopathy without gangrene: Secondary | ICD-10-CM | POA: Diagnosis not present

## 2024-01-01 DIAGNOSIS — I5032 Chronic diastolic (congestive) heart failure: Secondary | ICD-10-CM | POA: Diagnosis not present

## 2024-01-01 DIAGNOSIS — E1151 Type 2 diabetes mellitus with diabetic peripheral angiopathy without gangrene: Secondary | ICD-10-CM | POA: Diagnosis not present

## 2024-01-01 DIAGNOSIS — E1122 Type 2 diabetes mellitus with diabetic chronic kidney disease: Secondary | ICD-10-CM | POA: Diagnosis not present

## 2024-01-01 DIAGNOSIS — N184 Chronic kidney disease, stage 4 (severe): Secondary | ICD-10-CM | POA: Diagnosis not present

## 2024-01-01 DIAGNOSIS — I110 Hypertensive heart disease with heart failure: Secondary | ICD-10-CM | POA: Diagnosis not present

## 2024-01-01 DIAGNOSIS — N179 Acute kidney failure, unspecified: Secondary | ICD-10-CM | POA: Diagnosis not present

## 2024-01-02 ENCOUNTER — Other Ambulatory Visit: Payer: Self-pay | Admitting: *Deleted

## 2024-01-02 NOTE — Transitions of Care (Post Inpatient/ED Visit) (Signed)
 Transition of Care week 3  Visit Note  01/02/2024  Name: Micheal Walls MRN: [id number]          DOB: 01/12/1936  Situation: Patient enrolled in [facility] 30-day program. Visit completed with Micheal Walls by telephone.   Background:   Initial Transition Care Management Follow-up Telephone Call    Past Medical History:  Diagnosis Date   Acute metabolic encephalopathy 01/16/2020   Amputated below knee [facility])    right   Anemia    Aspiration pneumonia (HCC) 11/02/2019   Cataract 2019   Chronic kidney disease 2006   Diastolic heart failure (HCC)    Diverticulosis    DM (diabetes mellitus) (HCC)    GERD (gastroesophageal reflux disease) 1995   Gout    Hyperlipemia    OSA on CPAP    RBBB    Sepsis (HCC) 11/02/2019   Sleep apnea 2000   Stroke [facility]) 2002   Systemic hypertension    Thyroid  disease 2022    Assessment: Patient Reported Symptoms: Cognitive Cognitive Status: Able to follow simple commands, Alert and oriented to person, place, and time, Insightful and able to interpret abstract concepts, Normal speech and language skills      Neurological Neurological Review of Symptoms: No symptoms reported    HEENT HEENT Symptoms Reported: No symptoms reported      Cardiovascular Cardiovascular Symptoms Reported: No symptoms reported    Respiratory Respiratory Symptoms Reported: No symptoms reported Respiratory Self-Management Outcome: 4 (good)  Endocrine Endocrine Symptoms Reported: No symptoms reported List most recent blood sugar readings, include date and time of day: Fasting this am was 115 Endocrine Self-Management Outcome: 4 (good)  Gastrointestinal Gastrointestinal Symptoms Reported: Not assessed      Genitourinary Genitourinary Symptoms Reported: Not assessed    Integumentary Integumentary Symptoms Reported: No symptoms reported    Musculoskeletal   Musculoskeletal Self-Management Outcome: 4 (good) Musculoskeletal Comment: [facility] for PT 2-3 times weekly.  Patient was able to take a few steps yesterday while using a walker      Psychosocial Psychosocial Symptoms Reported: Not assessed         There were no vitals filed for this visit.  Medications Reviewed Today     Reviewed by Lucky Andrea LABOR, RN (Registered Nurse) on 01/02/24 at 1435  Med List Status: <None>   Medication Order Taking? Sig Documenting Provider Last Dose Status Informant  allopurinol  (ZYLOPRIM ) 100 MG tablet [id number] Yes Take 100 mg by mouth daily. [provider]  Active Pharmacy Records, Self           Med Note [name] Oct 22, 2020  9:51 AM)    aspirin  EC 81 MG tablet [id number] Yes Take 81 mg by mouth daily. Swallow whole. [provider]  Active Pharmacy Records, Self  colchicine  0.6 MG tablet [id number] Yes Take 0.6 mg by mouth once a week.  Patient taking differently: Take 0.6 mg by mouth daily as needed.   [provider]  Active Pharmacy Records, Self           Med Note (MARROW, [name]   Mon Dec 11, 2023 10:09 AM) [name]   Continuous Glucose Sensor (FREESTYLE LIBRE 14 DAY SENSOR) [name] [id number] Yes Use as directed to check blood sugars [name], [name]  Active Self, Pharmacy Records  diltiazem  (CARDIZEM  CD) 180 MG 24 hr capsule [id number] Yes Take 1 capsule (  180 mg total) by mouth daily. [name], MD  Active   dorzolamide  (TRUSOPT ) 2 % ophthalmic solution [id number] Yes Place 1 drop into both eyes 2 (two) times daily. [provider]  Active Pharmacy Records, Self  ferrous sulfate  325 (65 FE) MG tablet [id number] Yes Take 1 tablet (325 mg total) by mouth 2 (two) times daily with a meal. [name], [name], MD  Active Pharmacy Records, Self  furosemide  (LASIX ) 40 MG tablet [id number] Yes TAKE ONE (1) TABLET BY MOUTH TWICE DAILY *REFILL REQUESTDEWAINE [name], [name]  Active Self, Pharmacy Records  gabapentin  (NEURONTIN ) 100 MG  capsule [id number] Yes TAKE TWO (2) CAPSULES BY MOUTH IN THE MORNING, TAKE 1 CAPSULE AT NOON AND 1 CAPSULE EVERY EVENING *REFILL REQUESTDEWAINE [name], [name]  Active Self, Pharmacy Records  glucose blood (FREESTYLE PRECISION NEO TEST) test strip [id number] Yes Use as instructed [name], [name]  Active Pharmacy Records, Self  guaiFENesin  (ROBITUSSIN) 100 MG/5ML liquid [id number]  Take 10 mLs by mouth every 8 (eight) hours as needed for cough or to loosen phlegm.  Patient not taking: Reported on 01/02/2024   [name], MD  Active   latanoprost  (XALATAN ) 0.005 % ophthalmic solution [id number] Yes Place 1 drop into both eyes at bedtime.  [provider]  Active Pharmacy Records, Self           Med Note [name] Oct 22, 2020 10:00 AM)    Multiple Vitamins-Minerals (PRESERVISION AREDS) CAPS [id number] Yes Take 2 capsules by mouth daily. [provider]  Active Pharmacy Records, Self  pantoprazole  (PROTONIX ) 40 MG tablet [id number] Yes TAKE 1 TABLET BY MOUTH EVERY DAY AT BEDTIME [name], [name]  Active   rosuvastatin  (CRESTOR ) 20 MG tablet [id number] Yes TAKE 1 TABLET (20MG  TOTAL) BY MOUTH AT BEDTIME [name], [name], MD  Active Self, Pharmacy Records  Semaglutide , 2 MG/DOSE, (OZEMPIC , 2 MG/DOSE,) 8 MG/3ML SOPN [id number] Yes Inject 2 mg into the skin once a week. [name], [name]  Active Self, Pharmacy Records           Med Note [facility], [name] Dec 11, 2023 10:13 AM) [name]   senna-docusate (SENOKOT-S) 8.6-50 MG tablet [id number] Yes Take 1 tablet by mouth 2 (two) times daily between meals as needed for mild constipation or moderate constipation. [name], [name], MD  Active Pharmacy Records, Self  SYNTHROID  25 MCG tablet [id number] Yes Take 1 tablet by mouth Monday - Friday [name], [name]  Active Self, Pharmacy Records  tamsulosin  (FLOMAX ) 0.4 MG CAPS capsule [id number] Yes Take 0.4 mg by mouth daily. [provider]   Active Pharmacy Records, Self            Recommendation:   Continue Current Plan of Care  Follow Up Plan:   Telephone follow-up in 1 week  Andrea Dimes RN, BSN [place]  Value-Based Care Institute [facility] RN Care Manager [phone]

## 2024-01-02 NOTE — Patient Instructions (Signed)
 Visit Information  Thank you for taking time to visit with me today. Please don't hesitate to contact me if I can be of assistance to you before our next scheduled telephone appointment.  Following is a copy of your care plan:   Goals Addressed             This Visit's Progress    VBCI Transitions of Care (TOC) Care Plan       Problems:  Recent Hospitalization for treatment of Pneumonia Functional/Safety concern: needing better fitting Prosthetic   Goal:  Over the next 30 days, the patient will not experience hospital readmission  Interventions:  Transitions of Care: Post discharge activity limitations prescribed by provider reviewed Reviewed Signs and symptoms of infection Medication review Secure communication with PCP-she will address patient needing new prosthesis during visit on 01/08/24-shared with patient  Patient Self Care Activities:  Attend all scheduled provider appointments Call provider office for new concerns or questions  Participate in Transition of Care Program/Attend TOC scheduled calls Take medications as prescribed    Plan:  Telephone follow up appointment with care management team member scheduled for:  01/09/24 at 2:30pm        Patient verbalizes understanding of instructions and care plan provided today and agrees to view in MyChart. Active MyChart status and patient understanding of how to access instructions and care plan via MyChart confirmed with patient.     Telephone follow up appointment with care management team member scheduled for:01/09/24 at 2:30pm  Please call the care guide team at [phone] if you need to cancel or reschedule your appointment.   Please call 1-800-273-TALK (toll free, 24 hour hotline) go to [facility] Urgent [facility] [address], [place] [phone]) call 911 if you are experiencing a Mental Health or Behavioral Health Crisis or need someone to talk to.  Andrea Dimes RN, BSN [facility]  [facility] RN Care Manager [phone]

## 2024-01-03 DIAGNOSIS — I5032 Chronic diastolic (congestive) heart failure: Secondary | ICD-10-CM | POA: Diagnosis not present

## 2024-01-03 DIAGNOSIS — N179 Acute kidney failure, unspecified: Secondary | ICD-10-CM | POA: Diagnosis not present

## 2024-01-03 DIAGNOSIS — N184 Chronic kidney disease, stage 4 (severe): Secondary | ICD-10-CM | POA: Diagnosis not present

## 2024-01-03 DIAGNOSIS — E1122 Type 2 diabetes mellitus with diabetic chronic kidney disease: Secondary | ICD-10-CM | POA: Diagnosis not present

## 2024-01-03 DIAGNOSIS — E1151 Type 2 diabetes mellitus with diabetic peripheral angiopathy without gangrene: Secondary | ICD-10-CM | POA: Diagnosis not present

## 2024-01-03 DIAGNOSIS — I110 Hypertensive heart disease with heart failure: Secondary | ICD-10-CM | POA: Diagnosis not present

## 2024-01-04 ENCOUNTER — Other Ambulatory Visit: Payer: Self-pay | Admitting: Nurse Practitioner

## 2024-01-04 DIAGNOSIS — I6782 Cerebral ischemia: Secondary | ICD-10-CM

## 2024-01-04 DIAGNOSIS — E1151 Type 2 diabetes mellitus with diabetic peripheral angiopathy without gangrene: Secondary | ICD-10-CM | POA: Diagnosis not present

## 2024-01-04 DIAGNOSIS — E1122 Type 2 diabetes mellitus with diabetic chronic kidney disease: Secondary | ICD-10-CM

## 2024-01-04 DIAGNOSIS — I5032 Chronic diastolic (congestive) heart failure: Secondary | ICD-10-CM

## 2024-01-04 DIAGNOSIS — N184 Chronic kidney disease, stage 4 (severe): Secondary | ICD-10-CM | POA: Diagnosis not present

## 2024-01-04 DIAGNOSIS — N179 Acute kidney failure, unspecified: Secondary | ICD-10-CM

## 2024-01-04 DIAGNOSIS — D472 Monoclonal gammopathy: Secondary | ICD-10-CM

## 2024-01-04 DIAGNOSIS — I110 Hypertensive heart disease with heart failure: Secondary | ICD-10-CM | POA: Diagnosis not present

## 2024-01-04 DIAGNOSIS — G4733 Obstructive sleep apnea (adult) (pediatric): Secondary | ICD-10-CM

## 2024-01-04 DIAGNOSIS — E039 Hypothyroidism, unspecified: Secondary | ICD-10-CM

## 2024-01-04 NOTE — Progress Notes (Unsigned)
 Chief Complaint  Patient presents with   initial home health certification   Received home health orders orders from [facility]. Start of care 12/18/2023.   Certification and orders from 12/18/2023 through 02/15/2024 are reviewed, signed and faxed back to home health company.  Need of intermittent skilled services at home: SN, PT, OT, and MSW  The home health care plan has been established by me and will be reviewed and updated as needed to maximize patient recovery.  I certify that all home health services have been and will be furnished to the patient while under my care.  Face-to-face encounter in which the need for home health services was established: Discharged from hospital on 12/17/2023, visit with me is on 01/08/2024  Patient is receiving home health services for the following diagnoses: Problem List Items Addressed This Visit       Cardiovascular and Mediastinum   Cerebral ischemia   Hypertensive heart disease with chronic diastolic congestive heart failure (HCC) - Primary     Respiratory   OSA on CPAP     Endocrine   Type 2 diabetes mellitus with stage 4 chronic kidney disease, with long-term current use of insulin  (HCC)   Hypothyroidism     Genitourinary   Acute renal failure superimposed on stage 4 chronic kidney disease (HCC)   Other Visit Diagnoses       Monoclonal gammopathy [D47.2]            [name], [name]

## 2024-01-08 ENCOUNTER — Encounter: Payer: Self-pay | Admitting: Nurse Practitioner

## 2024-01-08 ENCOUNTER — Ambulatory Visit (INDEPENDENT_AMBULATORY_CARE_PROVIDER_SITE_OTHER): Admitting: Nurse Practitioner

## 2024-01-08 VITALS — BP 118/78 | HR 81 | Temp 97.9°F | Ht 73.0 in

## 2024-01-08 DIAGNOSIS — Z794 Long term (current) use of insulin: Secondary | ICD-10-CM | POA: Diagnosis not present

## 2024-01-08 DIAGNOSIS — J189 Pneumonia, unspecified organism: Secondary | ICD-10-CM | POA: Diagnosis not present

## 2024-01-08 DIAGNOSIS — G4733 Obstructive sleep apnea (adult) (pediatric): Secondary | ICD-10-CM

## 2024-01-08 DIAGNOSIS — I130 Hypertensive heart and chronic kidney disease with heart failure and stage 1 through stage 4 chronic kidney disease, or unspecified chronic kidney disease: Secondary | ICD-10-CM

## 2024-01-08 DIAGNOSIS — N184 Chronic kidney disease, stage 4 (severe): Secondary | ICD-10-CM

## 2024-01-08 DIAGNOSIS — I110 Hypertensive heart disease with heart failure: Secondary | ICD-10-CM

## 2024-01-08 DIAGNOSIS — E039 Hypothyroidism, unspecified: Secondary | ICD-10-CM

## 2024-01-08 DIAGNOSIS — E782 Mixed hyperlipidemia: Secondary | ICD-10-CM | POA: Diagnosis not present

## 2024-01-08 DIAGNOSIS — E1122 Type 2 diabetes mellitus with diabetic chronic kidney disease: Secondary | ICD-10-CM | POA: Diagnosis not present

## 2024-01-08 DIAGNOSIS — I5032 Chronic diastolic (congestive) heart failure: Secondary | ICD-10-CM | POA: Diagnosis not present

## 2024-01-08 DIAGNOSIS — Z89519 Acquired absence of unspecified leg below knee: Secondary | ICD-10-CM

## 2024-01-08 DIAGNOSIS — E113592 Type 2 diabetes mellitus with proliferative diabetic retinopathy without macular edema, left eye: Secondary | ICD-10-CM | POA: Diagnosis not present

## 2024-01-08 DIAGNOSIS — D472 Monoclonal gammopathy: Secondary | ICD-10-CM | POA: Diagnosis not present

## 2024-01-08 DIAGNOSIS — I739 Peripheral vascular disease, unspecified: Secondary | ICD-10-CM

## 2024-01-08 LAB — LIPID PANEL
Chol/HDL Ratio: 3.7 ratio (ref 0.0–5.0)
Cholesterol, Total: 108 mg/dL (ref 100–199)
HDL: 29 mg/dL — ABNORMAL LOW (ref >39)
LDL Chol Calc (NIH): 55 mg/dL (ref 0–99)
Triglycerides: 133 mg/dL (ref 0–149)
VLDL Cholesterol Cal: 24 mg/dL (ref 5–40)

## 2024-01-08 LAB — HEMOGLOBIN A1C
Est. average glucose Bld gHb Est-mCnc: 146 mg/dL
Hgb A1c MFr Bld: 6.7 % — ABNORMAL HIGH (ref 4.8–5.6)

## 2024-01-08 NOTE — Assessment & Plan Note (Signed)
 Stable, continue current medications.

## 2024-01-08 NOTE — Assessment & Plan Note (Signed)
He is doing well on his CPAP.  Has good quality of life while using.

## 2024-01-08 NOTE — Assessment & Plan Note (Signed)
Continue f/u with opthalmology.

## 2024-01-08 NOTE — Assessment & Plan Note (Signed)
Stable, continue f/u with Cardiology

## 2024-01-08 NOTE — Patient Instructions (Signed)
 Community-Acquired Pneumonia, Adult Pneumonia is a lung infection that causes inflammation and the buildup of mucus and fluids in the lungs. This may cause coughing and difficulty breathing. Community-acquired pneumonia is pneumonia that develops in people who are not, and have not recently been, in a hospital or other health care facility. Usually, pneumonia develops as a result of an illness that is caused by a virus, such as the common cold and the flu (influenza). It can also be caused by bacteria or fungi. While the common cold and influenza can pass from person to person (are contagious), pneumonia itself is not considered contagious. What are the causes? This condition may be caused by: Viruses. Bacteria. Fungi. What increases the risk? The following factors may make you more likely to develop this condition: Being over age 40 or having certain medical conditions, such as: A long-term (chronic) disease, such as: chronic obstructive pulmonary disease (COPD), asthma, heart failure, diabetes, or kidney disease. A condition that increases the risk of breathing in (aspirating) mucus and other fluids from your mouth and nose. A weakened body defense system (immune system). Having had your spleen removed (splenectomy). The spleen is the organ that helps fight germs and infections. Not cleaning your teeth and gums well (poor dental hygiene). Using tobacco products. Traveling to places where germs that cause pneumonia are present or being near certain animals or animal habitats that could have germs that cause pneumonia. What are the signs or symptoms? Symptoms of this condition include: A dry cough or a wet (productive) cough. A fever, sweating, or chills. Chest pain, especially when breathing deeply or coughing. Fast breathing, difficulty breathing, or shortness of breath. Tiredness (fatigue) and muscle aches. How is this diagnosed? This condition may be diagnosed based on your medical  history or a physical exam. You may also have tests, including: Imaging, such as a chest X-ray or lung ultrasound. Tests of: The level of oxygen and other gases in your blood. Mucus from your lungs (sputum). Fluid around your lungs (pleural fluid). Your urine. How is this treated? Treatment for this condition depends on many factors, such as the cause of your pneumonia, your medicines, and other medical conditions that you have. For most adults, pneumonia may be treated at home. In some cases, treatment must happen in a hospital and may include: Medicines that are given by mouth (orally) or through an IV, including: Antibiotic medicines, if bacteria caused the pneumonia. Medicines that kill viruses (antiviral medicines), if a virus caused the pneumonia. Oxygen therapy. Severe pneumonia, although rare, may require the following treatments: Mechanical ventilation.This procedure uses a machine to help you breathe if you cannot breathe well on your own or maintain a safe level of blood oxygen. Thoracentesis. This procedure removes any buildup of pleural fluid to help with breathing. Follow these instructions at home:  Medicines Take over-the-counter and prescription medicines only as told by your health care provider. Take cough medicine only if you have trouble sleeping. Cough medicine can prevent your body from removing mucus from your lungs. If you were prescribed antibiotics, take them as told by your health care provider. Do not stop taking the antibiotic even if you start to feel better. Lifestyle     Do not drink alcohol. Do not use any products that contain nicotine or tobacco. These products include cigarettes, chewing tobacco, and vaping devices, such as e-cigarettes. If you need help quitting, ask your health care provider. Eat a healthy diet. This includes plenty of vegetables, fruits, whole grains, low-fat  dairy products, and lean protein. General instructions Rest a lot and  get at least 8 hours of sleep each night. Sleep in a partly upright position at night. Place a few pillows under your head or sleep in a reclining chair. Return to your normal activities as told by your health care provider. Ask your health care provider what activities are safe for you. Drink enough fluid to keep your urine pale yellow. This helps to thin the mucus in your lungs. If your throat is sore, gargle with a mixture of salt and water 3-4 times a day or as needed. To make salt water, completely dissolve -1 tsp (3-6 g) of salt in 1 cup (237 mL) of warm water. Keep all follow-up visits. How is this prevented? You can lower your risk of developing community-acquired pneumonia by: Getting the pneumonia vaccine. There are different types and schedules of pneumonia vaccines. Ask your health care provider which option is best for you. Consider getting the pneumonia vaccine if: You are older than 86 years of age. You are 67-15 years of age and are receiving cancer treatment, have chronic lung disease, or have other medical conditions that affect your immune system. Ask your health care provider if this applies to you. Getting your influenza vaccine every year. Ask your health care provider which type of vaccine is best for you. Getting regular dental checkups. Washing your hands often with soap and water for at least 20 seconds. If soap and water are not available, use hand sanitizer. Contact a health care provider if: You have a fever. You have trouble sleeping because you cannot control your cough with cough medicine. Get help right away if: Your shortness of breath becomes worse. Your chest pain increases. Your sickness becomes worse, especially if you are an older adult or have a weak immune system. You cough up blood. These symptoms may be an emergency. Get help right away. Call 911. Do not wait to see if the symptoms will go away. Do not drive yourself to the  hospital. Summary Pneumonia is an infection of the lungs. Community-acquired pneumonia develops in people who have not been in the hospital. It can be caused by bacteria, viruses, or fungi. This condition may be treated with antibiotics or antiviral medicines. Severe pneumonia may require a hospital stay and treatment to help with breathing. This information is not intended to replace advice given to you by your health care provider. Make sure you discuss any questions you have with your health care provider. Document Revised: 05/03/2023 Document Reviewed: 07/28/2021 Elsevier Patient Education  2025 [employer].

## 2024-01-08 NOTE — Assessment & Plan Note (Signed)
 Continue f/u with Oncology

## 2024-01-08 NOTE — Assessment & Plan Note (Addendum)
 hgbA1c improved at last visit, and his blood sugars have been mostly normal except maybe 1-2 outliers of elevated blood sugar due to eating sweets. Continue current medications. Ensure follow-up with podiatrist on August 7.  Ensure follow-up with ophthalmologist in August.

## 2024-01-08 NOTE — Assessment & Plan Note (Signed)
Cholesterol levels have stable.  Continue statin

## 2024-01-08 NOTE — Assessment & Plan Note (Signed)
 Blood pressure is fairly controlled, continue current medications, continue f/u with Cardiology

## 2024-01-08 NOTE — Assessment & Plan Note (Signed)
 Will refer to [facility] and prosthesis due to recent weight loss.

## 2024-01-08 NOTE — Assessment & Plan Note (Signed)
 Admitted for one day and treated with antibiotics, he was a part of the paramedicine program and discharged on 12/17/2023. He feels he is doing well and continues with PT twice a week.

## 2024-01-08 NOTE — Progress Notes (Signed)
 I,Victoria T Emmitt, CMA,acting as a Neurosurgeon for Micheal Ada, [name].,have documented all relevant documentation on the behalf of Micheal Ada, [name],as directed by  Micheal Ada, [name] while in the presence of Micheal Ada, [name].  Subjective:  Patient ID: Micheal Walls , male    DOB: [birth year] , 88 y.o.   MRN: [id number]  Chief Complaint  Patient presents with   Hospitalization Follow-up    Patient presents today for hospital follow up. Admitted on 6/29 & discharged on 7/06 at [facility], for pneumonia. Today he reports feeling good.  He asks for a new rx for prosthetic right leg.    HPI  Discussed the use of AI scribe software for clinical note transcription with the patient, who gave verbal consent to proceed.  History of Present Illness Micheal Walls is an 88 year old male who presents for follow-up after hospitalization for pneumonia.  He was admitted to the hospital on June 29th for symptoms initially thought to be related to a urinary tract infection, but a chest x-ray revealed pneumonia. He was hospitalized for approximately six days and was discharged on July 6th. During his hospital stay, he experienced significant coughing, which has since resolved. Post-discharge, he was managed at home through a paramedicine program with virtual visits and in-person nursing care, including IV therapy.  He reports significant weight loss since his hospitalization, affecting the fit of his prosthesis and making ambulation difficult. He is currently receiving physical therapy at home twice a week to aid in regaining mobility. Walking with his current prosthesis feels like 'missing something.'  His blood sugar levels have been stable, with the lowest recorded at 86. He mentions attending a birthday party where his blood sugar was 135 after consuming cake. No issues with his current medications.  He is scheduled to see an eye doctor and a foot doctor on August 7th and has a follow-up with  his kidney doctor on August 21st. No current coughing or urinary issues. He is able to perform daily activities such as showering, changing clothes, and preparing food.   Wt Readings from Last 3 Encounters: 12/11/23 : 265 lb (120.2 kg) 11/14/23 : 267 lb 11.2 oz (121.4 kg) 09/05/23 : 265 lb (120.2 kg)  He was going to the [facility] clinic. He has PT coming to see him 2 times a week to help with is walking. When he is trying to walk with the prosthsis he feels like he is missing a step. He would like to go to [facility] and prosthetics [phone] and fax [phone]. [address]     Past Medical History:  Diagnosis Date   Acute metabolic encephalopathy 01/16/2020   Amputated below knee (HCC)    right   Anemia    Aspiration pneumonia (HCC) 11/02/2019   Cataract 2019   Chronic kidney disease 2006   Diastolic heart failure (HCC)    Diverticulosis    DM (diabetes mellitus) (HCC)    GERD (gastroesophageal reflux disease) 1995   Gout    Hyperlipemia    OSA on CPAP    RBBB    Sepsis (HCC) 11/02/2019   Sleep apnea 2000   Stroke [facility]) 2002   Systemic hypertension    Thyroid  disease 2022     Family History  Problem Relation Age of Onset   Diabetes Mother    Heart attack Father    Cancer Sister    Asthma Daughter      Current Outpatient Medications:  allopurinol  (ZYLOPRIM ) 100 MG tablet, Take 100 mg by mouth daily., Disp: , Rfl:    aspirin  EC 81 MG tablet, Take 81 mg by mouth daily. Swallow whole., Disp: , Rfl:    colchicine  0.6 MG tablet, Take 0.6 mg by mouth once a week. (Patient taking differently: Take 0.6 mg by mouth daily as needed.), Disp: , Rfl:    Continuous Glucose Sensor (FREESTYLE LIBRE 14 DAY SENSOR) MISC, Use as directed to check blood sugars, Disp: 6 each, Rfl: 1   diltiazem  (CARDIZEM  CD) 180 MG 24 hr capsule, Take 1 capsule (180 mg total) by mouth daily., Disp: 30 capsule, Rfl: 1   dorzolamide  (TRUSOPT ) 2 % ophthalmic solution, Place 1  drop into both eyes 2 (two) times daily., Disp: , Rfl:    ferrous sulfate  325 (65 FE) MG tablet, Take 1 tablet (325 mg total) by mouth 2 (two) times daily with a meal., Disp: , Rfl: 3   furosemide  (LASIX ) 40 MG tablet, TAKE ONE (1) TABLET BY MOUTH TWICE DAILY *REFILL REQUEST*, Disp: 60 tablet, Rfl: 10   gabapentin  (NEURONTIN ) 100 MG capsule, TAKE TWO (2) CAPSULES BY MOUTH IN THE MORNING, TAKE 1 CAPSULE AT NOON AND 1 CAPSULE EVERY EVENING *REFILL REQUEST*, Disp: 150 capsule, Rfl: 10   glucose blood (FREESTYLE PRECISION NEO TEST) test strip, Use as instructed, Disp: 100 each, Rfl: 12   latanoprost  (XALATAN ) 0.005 % ophthalmic solution, Place 1 drop into both eyes at bedtime. , Disp: , Rfl:    Multiple Vitamins-Minerals (PRESERVISION AREDS) CAPS, Take 2 capsules by mouth daily., Disp: , Rfl:    pantoprazole  (PROTONIX ) 40 MG tablet, TAKE 1 TABLET BY MOUTH EVERY DAY AT BEDTIME, Disp: 90 tablet, Rfl: 1   rosuvastatin  (CRESTOR ) 20 MG tablet, TAKE 1 TABLET (20MG  TOTAL) BY MOUTH AT BEDTIME, Disp: 90 tablet, Rfl: 3   Semaglutide , 2 MG/DOSE, (OZEMPIC , 2 MG/DOSE,) 8 MG/3ML SOPN, Inject 2 mg into the skin once a week., Disp: 9 mL, Rfl: 1   senna-docusate (SENOKOT-S) 8.6-50 MG tablet, Take 1 tablet by mouth 2 (two) times daily between meals as needed for mild constipation or moderate constipation., Disp: 60 tablet, Rfl: 0   SYNTHROID  25 MCG tablet, Take 1 tablet by mouth Monday - Friday, Disp: 60 tablet, Rfl: 2   tamsulosin  (FLOMAX ) 0.4 MG CAPS capsule, Take 0.4 mg by mouth daily., Disp: , Rfl:    guaiFENesin  (ROBITUSSIN) 100 MG/5ML liquid, Take 10 mLs by mouth every 8 (eight) hours as needed for cough or to loosen phlegm. (Patient not taking: Reported on 01/08/2024), Disp: 120 mL, Rfl: 0   Allergies  Allergen Reactions   Vancomycin  Rash     Review of Systems  Constitutional: Negative.   Respiratory: Negative.    Gastrointestinal: Negative.   Musculoskeletal:        Needs referral for new prosthesis due to  weight loss  Skin: Negative.   Allergic/Immunologic: Negative.   Psychiatric/Behavioral: Negative.       Today's Vitals   01/08/24 0942  BP: 118/78  Pulse: 81  Temp: 97.9 F (36.6 C)  SpO2: 98%  Height: 6' 1 (1.854 m)   Body mass index is 34.96 kg/m.  Wt Readings from Last 3 Encounters:  12/11/23 265 lb (120.2 kg)  11/14/23 267 lb 11.2 oz (121.4 kg)  09/05/23 265 lb (120.2 kg)     Objective:  Physical Exam Vitals and nursing note reviewed.  Constitutional:      General: He is not in acute distress.  Appearance: Normal appearance. He is obese.  Cardiovascular:     Rate and Rhythm: Normal rate and regular rhythm.     Pulses: Normal pulses.     Heart sounds: Normal heart sounds. No murmur heard. Pulmonary:     Effort: Pulmonary effort is normal. No respiratory distress.     Breath sounds: Normal breath sounds. No wheezing.  Musculoskeletal:        General: No tenderness. Normal range of motion.     Comments: Right BKA prosthesis loosely fitting.  Uses motorized wheelchair  Skin:    General: Skin is warm and dry.     Capillary Refill: Capillary refill takes less than 2 seconds.  Neurological:     General: No focal deficit present.     Mental Status: He is alert and oriented to person, place, and time.     Cranial Nerves: No cranial nerve deficit.     Motor: No weakness.  Psychiatric:        Mood and Affect: Mood normal.        Behavior: Behavior normal.        Thought Content: Thought content normal.        Judgment: Judgment normal.         Assessment And Plan:  Pneumonia of right upper lobe due to infectious organism Assessment & Plan: Admitted for one day and treated with antibiotics, he was a part of the paramedicine program and discharged on 12/17/2023. He feels he is doing well and continues with PT twice a week.    Hypertensive heart disease with chronic diastolic congestive heart failure (HCC) [I11.0, I50.32] Assessment & Plan: Blood pressure is  fairly controlled, continue current medications, continue f/u with Cardiology   Type 2 diabetes mellitus with stage 4 chronic kidney disease, with long-term current use of insulin  (HCC) [E11.22, N18.4, Z79.4] Assessment & Plan: hgbA1c improved at last visit, and his blood sugars have been mostly normal except maybe 1-2 outliers of elevated blood sugar due to eating sweets. Continue current medications.   Orders: -     Hemoglobin A1c  OSA on CPAP [G47.33] Assessment & Plan: He is doing well on his CPAP.  Has good quality of life while using.   Monoclonal gammopathy [D47.2] Assessment & Plan: Continue f/u with Oncology   Acquired hypothyroidism [E03.9] Assessment & Plan: Stable, continue current medications.    Mixed hyperlipidemia Assessment & Plan: Cholesterol levels have stable.  Continue statin  Orders: -     Lipid panel  PAD (peripheral artery disease) (HCC) Assessment & Plan: Continue statin.   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   Orders: -     Ambulatory referral to Physical Therapy      Return if symptoms worsen or fail to improve.  Patient was given opportunity to ask questions. Patient verbalized understanding of the plan and was able to repeat key elements of the plan. All questions were answered to their satisfaction.  Micheal Ada, [name]  I, Micheal Ada, [name], have reviewed all documentation for this visit. The documentation on 01/08/24 for the exam, diagnosis, procedures, and orders are all accurate and complete.   IF YOU HAVE BEEN REFERRED TO A SPECIALIST, IT MAY TAKE 1-2 WEEKS TO SCHEDULE/PROCESS THE REFERRAL. IF YOU HAVE  NOT HEARD FROM US /SPECIALIST IN TWO WEEKS, PLEASE GIVE  US  A CALL AT [phone] X [phone].   THE PATIENT IS ENCOURAGED TO PRACTICE SOCIAL DISTANCING DUE TO THE COVID-19 PANDEMIC.

## 2024-01-08 NOTE — Assessment & Plan Note (Signed)
 Continue statin.

## 2024-01-09 ENCOUNTER — Other Ambulatory Visit: Payer: Self-pay | Admitting: *Deleted

## 2024-01-09 DIAGNOSIS — I110 Hypertensive heart disease with heart failure: Secondary | ICD-10-CM | POA: Diagnosis not present

## 2024-01-09 DIAGNOSIS — N184 Chronic kidney disease, stage 4 (severe): Secondary | ICD-10-CM | POA: Diagnosis not present

## 2024-01-09 DIAGNOSIS — I5032 Chronic diastolic (congestive) heart failure: Secondary | ICD-10-CM | POA: Diagnosis not present

## 2024-01-09 DIAGNOSIS — E1122 Type 2 diabetes mellitus with diabetic chronic kidney disease: Secondary | ICD-10-CM | POA: Diagnosis not present

## 2024-01-09 DIAGNOSIS — E1151 Type 2 diabetes mellitus with diabetic peripheral angiopathy without gangrene: Secondary | ICD-10-CM | POA: Diagnosis not present

## 2024-01-09 DIAGNOSIS — N179 Acute kidney failure, unspecified: Secondary | ICD-10-CM | POA: Diagnosis not present

## 2024-01-09 NOTE — Patient Instructions (Signed)
 Visit Information  Thank you for taking time to visit with me today. Please don't hesitate to contact me if I can be of assistance to you before our next scheduled telephone appointment.  Following is a copy of your care plan:   Goals Addressed             This Visit's Progress    VBCI Transitions of Care (TOC) Care Plan       Problems:  Recent Hospitalization for treatment of Pneumonia Functional/Safety concern: needing better fitting Prosthetic   Goal:  Over the next 30 days, the patient will not experience hospital readmission  Interventions:  Transitions of Care: Post discharge activity limitations prescribed by provider reviewed Reviewed Signs and symptoms of infection Medication review Reviewed referral for new prosthetic, advised patient to contact [facility] [phone] for scheduling Reviewed and discussed PCP notes  Patient Self Care Activities:  Attend all scheduled provider appointments Call provider office for new concerns or questions  Participate in Transition of Care Program/Attend [facility] scheduled calls Take medications as prescribed    Plan:  Telephone follow up appointment with care management team member scheduled for:  01/17/24 at 2:30pm        Patient verbalizes understanding of instructions and care plan provided today and agrees to view in MyChart. Active MyChart status and patient understanding of how to access instructions and care plan via MyChart confirmed with patient.     Telephone follow up appointment with care management team member scheduled for:01/17/24 at 2:30pm  Please call the care guide team at [phone] if you need to cancel or reschedule your appointment.   Please call 1-800-273-TALK (toll free, 24 hour hotline) go to [facility] Urgent [facility] [address] [place] [phone]) call 911 if you are experiencing a Mental Health or Behavioral Health Crisis or need someone to talk to.  Andrea Dimes RN,  BSN [place]  [facility] Institute [facility] Health RN Care Manager [phone]

## 2024-01-09 NOTE — Transitions of Care (Post Inpatient/ED Visit) (Signed)
 Transition of Care week 4  Visit Note  01/09/2024  Name: Micheal Walls MRN: [id number]          DOB: 02-08-1936  Situation: Patient enrolled in [facility] 30-day program. Visit completed with Micheal Walls by telephone.   Background:   Initial Transition Care Management Follow-up Telephone Call    Past Medical History:  Diagnosis Date   Acute metabolic encephalopathy 01/16/2020   Amputated below knee (HCC)    right   Anemia    Aspiration pneumonia (HCC) 11/02/2019   Cataract 2019   Chronic kidney disease 2006   Diastolic heart failure (HCC)    Diverticulosis    DM (diabetes mellitus) (HCC)    GERD (gastroesophageal reflux disease) 1995   Gout    Hyperlipemia    OSA on CPAP    RBBB    Sepsis (HCC) 11/02/2019   Sleep apnea 2000   Stroke [facility]) 2002   Systemic hypertension    Thyroid  disease 2022    Assessment: Patient Reported Symptoms: Cognitive Cognitive Status: No symptoms reported      Neurological Neurological Review of Symptoms: No symptoms reported    HEENT HEENT Symptoms Reported: No symptoms reported      Cardiovascular Cardiovascular Symptoms Reported: No symptoms reported    Respiratory Respiratory Symptoms Reported: No symptoms reported    Endocrine Endocrine Symptoms Reported: No symptoms reported Endocrine Self-Management Outcome: 4 (good)  Gastrointestinal Gastrointestinal Symptoms Reported: Not assessed      Genitourinary Genitourinary Symptoms Reported: Not assessed    Integumentary Integumentary Symptoms Reported: No symptoms reported    Musculoskeletal Musculoskelatal Symptoms Reviewed: Limited mobility Musculoskeletal Self-Management Outcome: 4 (good) Musculoskeletal Comment: Continues with HH PT, feeling good about his progress. PCP placed referral for new prosthetic.      Psychosocial Psychosocial Symptoms Reported: Not assessed         There were no vitals filed for this visit.  Medications Reviewed Today     Reviewed by Lucky Andrea LABOR, RN (Registered Nurse) on 01/09/24 at 1448  Med List Status: <None>   Medication Order Taking? Sig Documenting Provider Last Dose Status Informant  allopurinol  (ZYLOPRIM ) 100 MG tablet [id number] Yes Take 100 mg by mouth daily. [provider]  Active Pharmacy Records, Self           Med Note [name] Oct 22, 2020  9:51 AM)    aspirin  EC 81 MG tablet [id number] Yes Take 81 mg by mouth daily. Swallow whole. [provider]  Active Pharmacy Records, Self  colchicine  0.6 MG tablet [id number] Yes Take 0.6 mg by mouth once a week.  Patient taking differently: Take 0.6 mg by mouth daily as needed.   [provider]  Active Pharmacy Records, Self           Med Note (MARROW, [name]   Mon Dec 11, 2023 10:09 AM) [name]   Continuous Glucose Sensor (FREESTYLE LIBRE 14 DAY SENSOR) [name] [id number] Yes Use as directed to check blood sugars [name], [name]  Active Self, Pharmacy Records  diltiazem  (CARDIZEM  CD) 180 MG 24 hr capsule [id number] Yes Take 1 capsule (180 mg total) by mouth daily. [name], MD  Active   dorzolamide  (TRUSOPT ) 2 % ophthalmic solution [id number] Yes Place 1 drop into both eyes 2 (two) times daily. [provider]  Active Pharmacy Records, Self  ferrous sulfate   325 (65 FE) MG tablet [id number] Yes Take 1 tablet (325 mg total) by mouth 2 (two) times daily with a meal. [name], [name], MD  Active Pharmacy Records, Self  furosemide  (LASIX ) 40 MG tablet [id number] Yes TAKE ONE (1) TABLET BY MOUTH TWICE DAILY *REFILL REQUESTDEWAINE [name], [name]  Active Self, Pharmacy Records  gabapentin  (NEURONTIN ) 100 MG capsule [id number] Yes TAKE TWO (2) CAPSULES BY MOUTH IN THE MORNING, TAKE 1 CAPSULE AT NOON AND 1 CAPSULE EVERY EVENING *REFILL REQUESTDEWAINE [name], [name]  Active Self, Pharmacy Records  glucose blood (FREESTYLE PRECISION NEO TEST) test strip  [id number] Yes Use as instructed [name], [name]  Active Pharmacy Records, Self  guaiFENesin  [facility]) 100 MG/5ML liquid [id number] Yes Take 10 mLs by mouth every 8 (eight) hours as needed for cough or to loosen phlegm. [name], MD  Active   latanoprost  (XALATAN ) 0.005 % ophthalmic solution [id number]  Place 1 drop into both eyes at bedtime.   Patient not taking: Reported on 01/09/2024   [provider]  Active Pharmacy Records, Self           Med Note [name] Oct 22, 2020 10:00 AM)    Multiple Vitamins-Minerals (PRESERVISION AREDS) CAPS [id number] Yes Take 2 capsules by mouth daily. [provider]  Active Pharmacy Records, Self  pantoprazole  (PROTONIX ) 40 MG tablet [id number] Yes TAKE 1 TABLET BY MOUTH EVERY DAY AT BEDTIME [name], [name]  Active   rosuvastatin  (CRESTOR ) 20 MG tablet [id number] Yes TAKE 1 TABLET (20MG  TOTAL) BY MOUTH AT BEDTIME [name], [name], MD  Active Self, Pharmacy Records  Semaglutide , 2 MG/DOSE, (OZEMPIC , 2 MG/DOSE,) 8 MG/3ML SOPN [id number] Yes Inject 2 mg into the skin once a week. [name], [name]  Active Self, Pharmacy Records           Med Note [facility], [name] Dec 11, 2023 10:13 AM) [name]   senna-docusate (SENOKOT-S) 8.6-50 MG tablet [id number] Yes Take 1 tablet by mouth 2 (two) times daily between meals as needed for mild constipation or moderate constipation. [name], [name], MD  Active Pharmacy Records, Self  SYNTHROID  25 MCG tablet [id number] Yes Take 1 tablet by mouth Monday - Friday [name], [name]  Active Self, Pharmacy Records  tamsulosin  (FLOMAX ) 0.4 MG CAPS capsule [id number] Yes Take 0.4 mg by mouth daily. [provider]  Active Pharmacy Records, Self            Recommendation:   Continue Current Plan of Care  Follow Up Plan:   Telephone follow-up in 1 week  Andrea Dimes RN, BSN [place]  [facility] Health RN Care  Manager [phone]

## 2024-01-10 DIAGNOSIS — I110 Hypertensive heart disease with heart failure: Secondary | ICD-10-CM | POA: Diagnosis not present

## 2024-01-10 DIAGNOSIS — E1122 Type 2 diabetes mellitus with diabetic chronic kidney disease: Secondary | ICD-10-CM | POA: Diagnosis not present

## 2024-01-10 DIAGNOSIS — N179 Acute kidney failure, unspecified: Secondary | ICD-10-CM | POA: Diagnosis not present

## 2024-01-10 DIAGNOSIS — E1151 Type 2 diabetes mellitus with diabetic peripheral angiopathy without gangrene: Secondary | ICD-10-CM | POA: Diagnosis not present

## 2024-01-10 DIAGNOSIS — I5032 Chronic diastolic (congestive) heart failure: Secondary | ICD-10-CM | POA: Diagnosis not present

## 2024-01-10 DIAGNOSIS — N184 Chronic kidney disease, stage 4 (severe): Secondary | ICD-10-CM | POA: Diagnosis not present

## 2024-01-11 ENCOUNTER — Encounter: Payer: Self-pay | Admitting: Nurse Practitioner

## 2024-01-11 ENCOUNTER — Other Ambulatory Visit: Payer: Self-pay | Admitting: Nurse Practitioner

## 2024-01-11 DIAGNOSIS — E1122 Type 2 diabetes mellitus with diabetic chronic kidney disease: Secondary | ICD-10-CM

## 2024-01-11 DIAGNOSIS — N184 Chronic kidney disease, stage 4 (severe): Secondary | ICD-10-CM | POA: Diagnosis not present

## 2024-01-11 DIAGNOSIS — N179 Acute kidney failure, unspecified: Secondary | ICD-10-CM | POA: Diagnosis not present

## 2024-01-11 DIAGNOSIS — I5032 Chronic diastolic (congestive) heart failure: Secondary | ICD-10-CM | POA: Diagnosis not present

## 2024-01-11 DIAGNOSIS — I110 Hypertensive heart disease with heart failure: Secondary | ICD-10-CM | POA: Diagnosis not present

## 2024-01-11 DIAGNOSIS — E1151 Type 2 diabetes mellitus with diabetic peripheral angiopathy without gangrene: Secondary | ICD-10-CM | POA: Diagnosis not present

## 2024-01-16 DIAGNOSIS — E1151 Type 2 diabetes mellitus with diabetic peripheral angiopathy without gangrene: Secondary | ICD-10-CM | POA: Diagnosis not present

## 2024-01-16 DIAGNOSIS — I110 Hypertensive heart disease with heart failure: Secondary | ICD-10-CM | POA: Diagnosis not present

## 2024-01-16 DIAGNOSIS — I5032 Chronic diastolic (congestive) heart failure: Secondary | ICD-10-CM | POA: Diagnosis not present

## 2024-01-16 DIAGNOSIS — N179 Acute kidney failure, unspecified: Secondary | ICD-10-CM | POA: Diagnosis not present

## 2024-01-16 DIAGNOSIS — E1122 Type 2 diabetes mellitus with diabetic chronic kidney disease: Secondary | ICD-10-CM | POA: Diagnosis not present

## 2024-01-16 DIAGNOSIS — N184 Chronic kidney disease, stage 4 (severe): Secondary | ICD-10-CM | POA: Diagnosis not present

## 2024-01-17 ENCOUNTER — Other Ambulatory Visit: Payer: Self-pay | Admitting: *Deleted

## 2024-01-17 DIAGNOSIS — I5032 Chronic diastolic (congestive) heart failure: Secondary | ICD-10-CM | POA: Diagnosis not present

## 2024-01-17 DIAGNOSIS — I739 Peripheral vascular disease, unspecified: Secondary | ICD-10-CM

## 2024-01-17 DIAGNOSIS — I110 Hypertensive heart disease with heart failure: Secondary | ICD-10-CM | POA: Diagnosis not present

## 2024-01-17 NOTE — Patient Instructions (Signed)
 Visit Information  Thank you for taking time to visit with me today. Please don't hesitate to contact me if I can be of assistance to you before our next scheduled telephone appointment.   Following is a copy of your care plan:   Goals Addressed             This Visit's Progress    COMPLETED: VBCI Transitions of Care (TOC) Care Plan       Problems:  Recent Hospitalization for treatment of Pneumonia Functional/Safety concern: needing better fitting Prosthetic   Goal:  Over the next 30 days, the patient will not experience hospital readmission  Interventions:  Transitions of Care: Referral to Longitudinal Nurse Case Manager for Ongoing follow-up Reviewed Signs and symptoms of infection Medication review Discussed patient reports prosthetic fitting scheduled on 01/25/24   Patient Self Care Activities:  Attend all scheduled provider appointments Call provider office for new concerns or questions  Participate in Transition of Care Program/Attend [facility] scheduled calls Take medications as prescribed    Plan:  The care management team will reach out to the patient again over the next 14 days.        Patient verbalizes understanding of instructions and care plan provided today and agrees to view in MyChart. Active MyChart status and patient understanding of how to access instructions and care plan via MyChart confirmed with patient.     The care management team will reach out to the patient again over the next 30 days.   Please call the care guide team at [phone] if you need to cancel or reschedule your appointment.   Please call [phone]-TALK (toll free, 24 hour hotline) go to [facility] [address] [place] [id number]) call 911 if you are experiencing a Mental Health or Behavioral Health Crisis or need someone to talk to.  Andrea Dimes RN, BSN [place]  [facility] Institute [facility] Health RN Care  Manager [phone]

## 2024-01-17 NOTE — Transitions of Care (Post Inpatient/ED Visit) (Signed)
 Transition of Care week 5  Visit Note  01/17/2024  Name: Micheal Walls MRN: [id number]          DOB: May 27, 1936  Situation: Patient enrolled in [facility] 30-day program. Visit completed with Micheal Walls by telephone.   Background:   Initial Transition Care Management Follow-up Telephone Call    Past Medical History:  Diagnosis Date   Acute metabolic encephalopathy 01/16/2020   Amputated below knee [facility])    right   Anemia    Aspiration pneumonia (HCC) 11/02/2019   Cataract 2019   Chronic kidney disease 2006   Diastolic heart failure (HCC)    Diverticulosis    DM (diabetes mellitus) (HCC)    GERD (gastroesophageal reflux disease) 1995   Gout    Hyperlipemia    OSA on CPAP    RBBB    Sepsis (HCC) 11/02/2019   Sleep apnea 2000   Stroke (HCC) 2002   Systemic hypertension    Thyroid  disease 2022    Assessment: Patient Reported Symptoms: Cognitive Cognitive Status: No symptoms reported      Neurological Neurological Review of Symptoms: No symptoms reported    HEENT HEENT Symptoms Reported: No symptoms reported      Cardiovascular Cardiovascular Symptoms Reported: No symptoms reported    Respiratory Respiratory Symptoms Reported: No symptoms reported Respiratory Management Strategies: CPAP Respiratory Self-Management Outcome: 4 (good)  Endocrine Endocrine Symptoms Reported: No symptoms reported Is patient diabetic?: Yes Is patient checking blood sugars at home?: Yes List most recent blood sugar readings, include date and time of day: Fasting this am-107 Endocrine Self-Management Outcome: 4 (good)  Gastrointestinal Gastrointestinal Symptoms Reported: Not assessed      Genitourinary Genitourinary Symptoms Reported: Not assessed    Integumentary Integumentary Symptoms Reported: Not assessed    Musculoskeletal Musculoskelatal Symptoms Reviewed: Limited mobility Additional Musculoskeletal Details: power wheelchair Musculoskeletal Management Strategies: Coping  strategies, Exercise, Routine screening Musculoskeletal Self-Management Outcome: 4 (good) Musculoskeletal Comment: HH PT twice weekly, scheduled for prosthetic fitting on 01/25/24      Psychosocial Psychosocial Symptoms Reported: Not assessed         There were no vitals filed for this visit.  Medications Reviewed Today     Reviewed by Lucky Andrea LABOR, RN (Registered Nurse) on 01/17/24 at 1442  Med List Status: <None>   Medication Order Taking? Sig Documenting Provider Last Dose Status Informant  allopurinol  (ZYLOPRIM ) 100 MG tablet [id number] Yes Take 100 mg by mouth daily. [provider]  Active Pharmacy Records, Self           Med Note [name] Oct 22, 2020  9:51 AM)    aspirin  EC 81 MG tablet [id number] Yes Take 81 mg by mouth daily. Swallow whole. [provider]  Active Pharmacy Records, Self  colchicine  0.6 MG tablet [id number] Yes Take 0.6 mg by mouth once a week. [provider]  Active Pharmacy Records, Self           Med Note (MARROW, [name]   Mon Dec 11, 2023 10:09 AM) [name]   Continuous Glucose Sensor (FREESTYLE LIBRE 14 DAY SENSOR) [name] [id number] Yes USE AS DIRECTED TO CHECK BLOOD SUGARS. CHANGE EVERY 14 DAYS [name], [name]  Active   diltiazem  (CARDIZEM  CD) 180 MG 24 hr capsule [id number] Yes Take 1 capsule (180 mg total) by mouth daily. [name], MD  Active  dorzolamide  (TRUSOPT ) 2 % ophthalmic solution [id number] Yes Place 1 drop into both eyes 2 (two) times daily. [provider]  Active Pharmacy Records, Self  ferrous sulfate  325 (65 FE) MG tablet [id number] Yes Take 1 tablet (325 mg total) by mouth 2 (two) times daily with a meal. [name], [name], MD  Active Pharmacy Records, Self  furosemide  (LASIX ) 40 MG tablet [id number] Yes TAKE ONE (1) TABLET BY MOUTH TWICE DAILY *REFILL REQUESTDEWAINE [name], [name]  Active Self,  Pharmacy Records  gabapentin  (NEURONTIN ) 100 MG capsule [id number] Yes TAKE TWO (2) CAPSULES BY MOUTH IN THE MORNING, TAKE 1 CAPSULE AT NOON AND 1 CAPSULE EVERY EVENING *REFILL REQUESTDEWAINE [name], [name]  Active Self, Pharmacy Records  glucose blood (FREESTYLE PRECISION NEO TEST) test strip [id number] Yes Use as instructed [name], [name]  Active Pharmacy Records, Self  guaiFENesin  (ROBITUSSIN) 100 MG/5ML liquid [id number]  Take 10 mLs by mouth every 8 (eight) hours as needed for cough or to loosen phlegm.  Patient not taking: Reported on 01/17/2024   [name], MD  Active   latanoprost  (XALATAN ) 0.005 % ophthalmic solution [id number] Yes Place 1 drop into both eyes at bedtime.  [provider]  Active Pharmacy Records, Self           Med Note [name] Oct 22, 2020 10:00 AM)    Multiple Vitamins-Minerals (PRESERVISION AREDS) CAPS [id number] Yes Take 2 capsules by mouth daily. [provider]  Active Pharmacy Records, Self  pantoprazole  (PROTONIX ) 40 MG tablet [id number] Yes TAKE 1 TABLET BY MOUTH EVERY DAY AT BEDTIME [name], [name]  Active   rosuvastatin  (CRESTOR ) 20 MG tablet [id number] Yes TAKE 1 TABLET (20MG  TOTAL) BY MOUTH AT BEDTIME [name], [name], MD  Active Self, Pharmacy Records  Semaglutide , 2 MG/DOSE, (OZEMPIC , 2 MG/DOSE,) 8 MG/3ML SOPN [id number] Yes INJECT 2 MG INTO THE SKIN ONCE A WEEK. (DX: E11.22) [name], [name]  Active   senna-docusate (SENOKOT-S) 8.6-50 MG tablet [id number] Yes Take 1 tablet by mouth 2 (two) times daily between meals as needed for mild constipation or moderate constipation. [name], [name], MD  Active Pharmacy Records, Self  SYNTHROID  25 MCG tablet [id number] Yes Take 1 tablet by mouth Monday - Friday [name], [name]  Active Self, Pharmacy Records  tamsulosin  (FLOMAX ) 0.4 MG CAPS capsule [id number] Yes Take 0.4 mg by mouth daily. [provider]  Active Pharmacy Records, Self            Recommendation:    Continue Current Plan of Care  Follow Up Plan:   Referral to RN Case Manager Closing From:  Transitions of Care Program  Andrea Dimes RN, BSN [place]  Value-Based Care Institute [facility] Health RN Care Manager [phone]

## 2024-01-18 ENCOUNTER — Encounter: Payer: Self-pay | Admitting: Podiatry

## 2024-01-18 ENCOUNTER — Ambulatory Visit (INDEPENDENT_AMBULATORY_CARE_PROVIDER_SITE_OTHER): Admitting: Podiatry

## 2024-01-18 DIAGNOSIS — L97521 Non-pressure chronic ulcer of other part of left foot limited to breakdown of skin: Secondary | ICD-10-CM | POA: Diagnosis not present

## 2024-01-18 DIAGNOSIS — I739 Peripheral vascular disease, unspecified: Secondary | ICD-10-CM

## 2024-01-18 DIAGNOSIS — L97522 Non-pressure chronic ulcer of other part of left foot with fat layer exposed: Secondary | ICD-10-CM

## 2024-01-18 DIAGNOSIS — E1151 Type 2 diabetes mellitus with diabetic peripheral angiopathy without gangrene: Secondary | ICD-10-CM

## 2024-01-18 NOTE — Progress Notes (Unsigned)
 Chief Complaint  Patient presents with   Diabetes    r Diabetic foot check Left foot. Trans. Amputation. 0 pain black skin discoloration. IDDM A1C 6.7. Patient wearing diabetic shoe.       HPI: 88 y.o. male presenting today for diabetic foot check of left foot history of imitation of all toes MPJ level, history of right-sided below-knee amputation.  He does have an area of pressure ulceration formation to the left lateral midfoot that he has not noticed until today.  Does have concomitant PVD, history of CVA, CHF, CKD, lymphedema of lower extremities.  Past Medical History:  Diagnosis Date   Acute metabolic encephalopathy 01/16/2020   Amputated below knee (HCC)    right   Anemia    Aspiration pneumonia (HCC) 11/02/2019   Cataract 2019   Chronic kidney disease 2006   Diastolic heart failure (HCC)    Diverticulosis    DM (diabetes mellitus) (HCC)    GERD (gastroesophageal reflux disease) 1995   Gout    Hyperlipemia    OSA on CPAP    RBBB    Sepsis (HCC) 11/02/2019   Sleep apnea 2000   Stroke [facility]) 2002   Systemic hypertension    Thyroid  disease 2022   Past Surgical History:  Procedure Laterality Date   ABDOMINAL AORTOGRAM W/LOWER EXTREMITY Left 08/11/2020   Procedure: ABDOMINAL AORTOGRAM W/LOWER EXTREMITY;  Surgeon: Serene Gaile ORN, MD;  Location: [facility] INVASIVE CV LAB;  Service: Cardiovascular;  Laterality: Left;   ABDOMINAL AORTOGRAM W/LOWER EXTREMITY N/A 02/26/2021   Procedure: ABDOMINAL AORTOGRAM W/LOWER EXTREMITY;  Surgeon: Magda Debby SAILOR, MD;  Location: [facility] INVASIVE CV LAB;  Service: Cardiovascular;  Laterality: N/A;   AMPUTATION Left 09/14/2020   Procedure: AMPUTATION LEFT GREAT TOE;  Surgeon: Silva Juliene SAUNDERS, DPM;  Location: [facility] OR;  Service: Podiatry;  Laterality: Left;   AMPUTATION Left 02/07/2023   Procedure: AMPUTATION TOE 3RD,4TH, and 5TH;  Surgeon: Tobie Franky SQUIBB, DPM;  Location: [facility] OR;  Service: Orthopedics/Podiatry;  Laterality: Left;   AMPUTATION TOE Left  12/03/2021   Procedure: AMPUTATION TOE, LEFT second;  Surgeon: Joya Stabs, DPM;  Location: [facility] OR;  Service: Podiatry;  Laterality: Left;   EYE SURGERY  May & July 2019   One eye at a time   LEG AMPUTATION BELOW KNEE     right   NM MYOCAR PERF WALL MOTION  09/18/2009   no ischemia   PERIPHERAL VASCULAR BALLOON ANGIOPLASTY Left 08/11/2020   Procedure: PERIPHERAL VASCULAR BALLOON ANGIOPLASTY;  Surgeon: Serene Gaile ORN, MD;  Location: [facility] INVASIVE CV LAB;  Service: Cardiovascular;  Laterality: Left;  AT   PERIPHERAL VASCULAR INTERVENTION Left 02/26/2021   Procedure: PERIPHERAL VASCULAR INTERVENTION;  Surgeon: Magda Debby SAILOR, MD;  Location: [facility] INVASIVE CV LAB;  Service: Cardiovascular;  Laterality: Left;   Allergies  Allergen Reactions   Vancomycin  Rash      PHYSICAL EXAM: There were no vitals filed for this visit.  General: The patient is alert and oriented x3 in no acute distress.  Dermatology: Left lateral midfoot there are 2 areas of eschar formation which appear consistent with pressure injury.  Distal lesion measures 1.5 x 2 cm and is stable appearing without fluctuance or surrounding erythema.  More proximal lesion starts at the medial aspect but does have a horizontal transverse streak measuring 2.5 x 11 centimeters.  In the main area of the lesion laterally there is small area of fluctuance with small hemorrhagic bulla.  The more proximal eschar was deroofed and there  is area of skin breakdown lateralmost aspect measuring 2.5 x 1 x 0.2 cm.  Remaining area of skin underneath the eschar is intact and was removed without incident.  Scant drainage.  No surrounding erythema or signs of acute bacterial infection.    Vascular: Left foot DP pulse palpable, PT pulse nonpalpable, significant pitting edema +2 present, difficult to palpate pulses due to this.  Capillary refill is intact to the distal stump of the left foot.  Neurological: Light touch sensation decreased. Protective  sensation decreased  Musculoskeletal Exam: History of right-sided below-knee amputation, left side amputation of all toes x 5 at metatarsophalangeal joint level.     Latest Ref Rng & Units 01/08/2024   10:39 AM 09/05/2023   12:13 PM 02/05/2023   12:44 PM  Hemoglobin A1C  Hemoglobin-A1c 4.8 - 5.6 % 6.7  6.5  6.3     ASSESSMENT / PLAN OF CARE: 1. Ulcer of left foot, limited to breakdown of skin (HCC)   2. Type II diabetes mellitus with peripheral circulatory disorder (HCC)   3. PAD (peripheral artery disease) (HCC)      No orders of the defined types were placed in this encounter.  None  Today the distal-most eschar was left intact as it is stable appearing in nature.  The more proximal area where there was area of hemorrhagic bulla centrally was sharply debrided using 312 scalpel blade and tissue nippers without incident.  Hemostasis achieved with compression.  Patient tolerated well.  Betadine wet-to-dry bandage was applied.  Reviewed daily dressing changes with patient  Reviewed offloading with patient.  Reviewed daily dressing changes with patient.  No signs of acute bacterial infection noted.  Diabetes with PAD, lymphedema, CKD has added risk factors for wound healing.  Low threshold for repeat vascular testing.  Discussed risks / concerns regarding ulcer with patient and possible sequelae if left untreated.  Stressed importance of infection prevention at home. Short-term goals are: prevent infection, off-load ulcer, heal ulcer Long-term goals are:  prevent recurrence, prevent amputation.   Return in about 2 weeks (around 02/01/2024) for Ulcer Check.   Ethan LITTIE Saddler, DPM, AACFAS [facility]     [address] [place], [place] [address]                Office [phone]  Fax [phone]

## 2024-01-18 NOTE — Patient Instructions (Signed)
 Apply Betadine to the wounds and the eschar and cover with a large padded bandage.  You may wash the areas with soapy water in between dressing changes.  Change dressings daily.  Monitor for increased redness, swelling, drainage, malodor, this can be a sign of infection if this develops contact office or go to the emergency room

## 2024-01-19 ENCOUNTER — Telehealth: Payer: Self-pay

## 2024-01-19 DIAGNOSIS — I110 Hypertensive heart disease with heart failure: Secondary | ICD-10-CM | POA: Diagnosis not present

## 2024-01-19 DIAGNOSIS — I5032 Chronic diastolic (congestive) heart failure: Secondary | ICD-10-CM | POA: Diagnosis not present

## 2024-01-19 NOTE — Progress Notes (Signed)
 Complex Care Management Note Care Guide Note  01/19/2024 Name: Micheal Walls MRN: [id number] DOB: October 06, 1935   Complex Care Management Outreach Attempts: An unsuccessful telephone outreach was attempted today to offer the patient information about available complex care management services.  Follow Up Plan:  Additional outreach attempts will be made to offer the patient complex care management information and services.   Encounter Outcome:  No Answer  Leotis Rase [facility], [facility] Guide  Direct Dial: [phone]  Fax [phone]

## 2024-01-22 ENCOUNTER — Telehealth: Payer: Self-pay | Admitting: Podiatry

## 2024-01-22 DIAGNOSIS — I110 Hypertensive heart disease with heart failure: Secondary | ICD-10-CM | POA: Diagnosis not present

## 2024-01-22 DIAGNOSIS — I5032 Chronic diastolic (congestive) heart failure: Secondary | ICD-10-CM | POA: Diagnosis not present

## 2024-01-22 NOTE — Telephone Encounter (Signed)
 Lorrene with [place] home health physical therapy called in to find out the weight bearing restrictions for the patient. Call back number is [phone]

## 2024-01-22 NOTE — Progress Notes (Signed)
 Complex Care Management Note  Care Guide Note 01/22/2024 Name: Micheal Walls MRN: [id number] DOB: 04-30-1936  Micheal Walls is a 88 y.o. year old male who sees [name], [name] for primary care. I reached out to [name] by phone today to offer complex care management services.  [name] was given information about Complex Care Management services today including:   The Complex Care Management services include support from the care team which includes your Nurse Care Manager, Clinical Social Worker, or Pharmacist.  The Complex Care Management team is here to help remove barriers to the health concerns and goals most important to you. Complex Care Management services are voluntary, and the patient may decline or stop services at any time by request to their care team member.   Complex Care Management Consent Status: Patient agreed to services and verbal consent obtained.   Follow up plan:  01/23/24 @ 1 PM  Encounter Outcome:  Patient Scheduled   Leotis Rase [facility], [facility] Guide  Direct Dial: [phone]  Fax [phone]

## 2024-01-23 ENCOUNTER — Other Ambulatory Visit: Payer: Self-pay

## 2024-01-23 NOTE — Patient Outreach (Signed)
 Complex Care Management   Visit Note  01/23/2024  Name:  Micheal Walls MRN: [id number] DOB: [birth year]  Situation: Referral received for Complex Care Management related to hypertension I obtained verbal consent from Patient.  Visit completed with patient  on the phone  Background:   Past Medical History:  Diagnosis Date   Acute metabolic encephalopathy 01/16/2020   Amputated below knee [facility])    right   Anemia    Aspiration pneumonia (HCC) 11/02/2019   Cataract 2019   Chronic kidney disease 2006   Diastolic heart failure (HCC)    Diverticulosis    DM (diabetes mellitus) (HCC)    GERD (gastroesophageal reflux disease) 1995   Gout    Hyperlipemia    OSA on CPAP    RBBB    Sepsis (HCC) 11/02/2019   Sleep apnea 2000   Stroke (HCC) 2002   Systemic hypertension    Thyroid  disease 2022    Assessment: Patient Reported Symptoms:  Cognitive Cognitive Status: No symptoms reported   Health Maintenance Behaviors: Sports Healing Pattern: Average Health Facilitated by: Rest, Healthy diet  Neurological Neurological Review of Symptoms: No symptoms reported Neurological Self-Management Outcome: 4 (good)  HEENT HEENT Symptoms Reported: Tearing HEENT Management Strategies: Medication therapy, Coping strategies HEENT Self-Management Outcome: 4 (good)    Cardiovascular Cardiovascular Symptoms Reported: No symptoms reported Does patient have uncontrolled Hypertension?: No Cardiovascular Management Strategies: Coping strategies, Adequate rest Cardiovascular Self-Management Outcome: 4 (good)  Respiratory Respiratory Symptoms Reported: No symptoms reported Respiratory Management Strategies: CPAP, Routine screening Respiratory Self-Management Outcome: 4 (good)  Endocrine Endocrine Symptoms Reported: No symptoms reported Is patient diabetic?: Yes Is patient checking blood sugars at home?: Yes List most recent blood sugar readings, include date and time of day: FAsting this am  96 Endocrine Self-Management Outcome: 4 (good)  Gastrointestinal Gastrointestinal Symptoms Reported: No symptoms reported Gastrointestinal Management Strategies: Medication therapy, Coping strategies Gastrointestinal Self-Management Outcome: 4 (good)    Genitourinary Genitourinary Symptoms Reported: No symptoms reported Genitourinary Management Strategies: Coping strategies Genitourinary Self-Management Outcome: 4 (good)  Integumentary Integumentary Symptoms Reported: No symptoms reported Skin Management Strategies: Routine screening Skin Self-Management Outcome: 4 (good)  Musculoskeletal Musculoskelatal Symptoms Reviewed: Difficulty walking, Limited mobility (power wheelchair) Musculoskeletal Management Strategies: Medical device, Coping strategies, Routine screening, Exercise Musculoskeletal Self-Management Outcome: 4 (good) Musculoskeletal Comment: HH PT twice weekly scheduled for prosthetic fitting R leg 01/25/2024 Falls in the past year?: No Number of falls in past year: 1 or less Was there an injury with Fall?: No Fall Risk Category Calculator: 0 Patient Fall Risk Level: Low Fall Risk Patient at Risk for Falls Due to: No Fall Risks Fall risk Follow up: Falls evaluation completed  Psychosocial Psychosocial Symptoms Reported: No symptoms reported Behavioral Management Strategies: Coping strategies Behavioral Health Self-Management Outcome: 4 (good) Major Change/Loss/Stressor/Fears (CP): Denies Techniques to Cope with Loss/Stress/Change: None Quality of Family Relationships: helpful, supportive, involved Do you feel physically threatened by others?: No      01/23/2024    2:06 PM  Depression screen PHQ 2/9  Decreased Interest 0  Down, Depressed, Hopeless 0  PHQ - 2 Score 0    Vitals:   01/23/24 1356  BP: (!) 165/84    Medications Reviewed Today     Reviewed by Kay Hendricks MATSU, RN (Case Manager) on 01/23/24 at 1345  Med List Status: <None>   Medication Order Taking?  Sig Documenting Provider Last Dose Status Informant  allopurinol  (ZYLOPRIM ) 100 MG tablet [id number] Yes Take 100 mg by mouth daily. [provider]  Active Pharmacy Records, Self           Med Note [name] Oct 22, 2020  9:51 AM)    aspirin  EC 81 MG tablet [id number] Yes Take 81 mg by mouth daily. Swallow whole. [provider]  Active Pharmacy Records, Self  colchicine  0.6 MG tablet [id number] Yes Take 0.6 mg by mouth once a week. [provider]  Active Pharmacy Records, Self           Med Note (MARROW, [name]   Mon Dec 11, 2023 10:09 AM) [name]   Continuous Glucose Sensor (FREESTYLE LIBRE 14 DAY SENSOR) [name] [id number] Yes USE AS DIRECTED TO CHECK BLOOD SUGARS. CHANGE EVERY 14 DAYS [name], [name]  Active   diltiazem  (CARDIZEM  CD) 180 MG 24 hr capsule [id number] Yes Take 1 capsule (180 mg total) by mouth daily. [name], MD  Active   dorzolamide  (TRUSOPT ) 2 % ophthalmic solution [id number] Yes Place 1 drop into both eyes 2 (two) times daily. [provider]  Active Pharmacy Records, Self  ferrous sulfate  325 (65 FE) MG tablet [id number] Yes Take 1 tablet (325 mg total) by mouth 2 (two) times daily with a meal. [name], [name], MD  Active Pharmacy Records, Self  furosemide  (LASIX ) 40 MG tablet [id number] Yes TAKE ONE (1) TABLET BY MOUTH TWICE DAILY *REFILL REQUEST*  Patient taking differently: Take 40 mg by mouth daily. Takes 2 in am  and at night   [name], [name]  Active Self, Pharmacy Records  gabapentin  (NEURONTIN ) 100 MG capsule [id number] Yes TAKE TWO (2) CAPSULES BY MOUTH IN THE MORNING, TAKE 1 CAPSULE AT NOON AND 1 CAPSULE EVERY EVENING *REFILL REQUESTDEWAINE [name], [name]  Active Self, Pharmacy Records  glucose blood (FREESTYLE PRECISION NEO TEST) test strip [id number] Yes Use as instructed [name], [name]  Active Pharmacy Records, Self   guaiFENesin  (ROBITUSSIN) 100 MG/5ML liquid [id number]  Take 10 mLs by mouth every 8 (eight) hours as needed for cough or to loosen phlegm.  Patient not taking: Reported on 01/23/2024   [name], MD  Consider Medication Status and Discontinue   latanoprost  (XALATAN ) 0.005 % ophthalmic solution [id number] Yes Place 1 drop into both eyes at bedtime.  [provider]  Active Pharmacy Records, Self           Med Note [name] Oct 22, 2020 10:00 AM)    Multiple Vitamins-Minerals (PRESERVISION AREDS) CAPS [id number] Yes Take 2 capsules by mouth daily. [provider]  Active Pharmacy Records, Self  pantoprazole  (PROTONIX ) 40 MG tablet [id number] Yes TAKE 1 TABLET BY MOUTH EVERY DAY AT BEDTIME [name], [name]  Active   rosuvastatin  (CRESTOR ) 20 MG tablet [id number] Yes TAKE 1 TABLET (20MG  TOTAL) BY MOUTH AT BEDTIME [name], [name], MD  Active Self, Pharmacy Records  Semaglutide , 2 MG/DOSE, (OZEMPIC , 2 MG/DOSE,) 8 MG/3ML SOPN [id number] Yes INJECT 2 MG INTO THE SKIN ONCE A WEEK. (DX: E11.22) [name], [name]  Active   senna-docusate (SENOKOT-S) 8.6-50 MG tablet [id number] Yes Take 1 tablet by mouth 2 (two) times daily between meals as needed for mild constipation or moderate constipation. [name], [name], MD  Active Pharmacy Records, Self  SYNTHROID  25 MCG tablet [id number] Yes Take 1 tablet by mouth Monday - Friday [name], [name]  Active Self, Pharmacy Records  tamsulosin  (FLOMAX )  0.4 MG CAPS capsule [id number]  Take 0.4 mg by mouth daily.  Patient not taking: Reported on 01/23/2024   [provider]  Active Pharmacy Records, Self            Recommendation:   Continue Current Plan of Care  Follow Up Plan:   Telephone follow-up 2 weeks  Hendricks Her RN, BSN  [place] [facility] VBCI-Population Health RN Case Manager   Direct [phone]

## 2024-01-23 NOTE — Patient Instructions (Signed)
 Visit Information  Thank you for taking time to visit with me today. Please don't hesitate to contact me if I can be of assistance to you before our next scheduled appointment.  Our next appointment is by telephone on 02-13-2024 at 1:00 PM  Please call the care guide team at [phone] if you need to cancel or reschedule your appointment.   Following is a copy of your care plan:   Goals Addressed             This Visit's Progress    VBCI RN Care Plan       Problems:  Chronic Disease Management support and education needs related to HTN  Goal: Over the next 90 days the Patient will attend all scheduled medical appointments: with providers as evidenced by completed encounter in electronic medical record        continue to work with RN Care Manager and/or Social Worker to address care management and care coordination needs related to HTN as evidenced by adherence to care management team scheduled appointments     demonstrate ongoing self health care management ability to adhere to treatment plan as evidenced by taking medications as prescribed, following dietary guidelines and attending appointments     take all medications exactly as prescribed and will call provider for medication related questions as evidenced by contacting provider for clarifications or concerns    verbalize basic understanding of hypertension disease process and self health management plan as evidenced by taking and recording daily BP with parameter goals of <140/80 Interventions:   Hypertension Interventions: Last practice recorded BP readings:  BP Readings from Last 3 Encounters:  01/23/24 (!) 165/84  01/08/24 118/78  12/17/23 122/64   Most recent eGFR/CrCl:  Lab Results  Component Value Date   EGFR 30 (L) 09/05/2023    No components found for: CRCL  Evaluation of current treatment plan related to hypertension self management and patient's adherence to plan as established by provider Provided education  to patient re: stroke prevention, s/s of heart attack and stroke Reviewed medications with patient and discussed importance of compliance Discussed plans with patient for ongoing care management follow up and provided patient with direct contact information for care management team Advised patient, providing education and rationale, to monitor blood pressure daily and record, calling PCP for findings outside established parameters Discussed complications of poorly controlled blood pressure such as heart disease, stroke, circulatory complications, vision complications, kidney impairment, sexual dysfunction Screening for signs and symptoms of depression related to chronic disease state  Assessed social determinant of health barriers  Patient Self-Care Activities:  Attend all scheduled provider appointments Call pharmacy for medication refills 3-7 days in advance of running out of medications Call provider office for new concerns or questions  Notify RN Care Manager of TOC call rescheduling needs Take medications as prescribed   check blood pressure daily keep a blood pressure log take blood pressure log to all doctor appointments call doctor for signs and symptoms of high blood pressure keep all doctor appointments take medications for blood pressure exactly as prescribed limit salt intake to 1500 mg/day  Plan:  Telephone follow up appointment with care management team member scheduled for:  02/13/2024 at 1:00 PM              Please call the Suicide and Crisis Lifeline: 988 call the [place]  National Suicide Prevention Lifeline: 916-284-5926 or TTY: 313-136-3233 TTY 574-792-3839) to talk to a trained counselor call 1-800-273-TALK (toll free, 24 hour hotline) go  to [facility] [address] [place] [phone]) call 911 if you are experiencing a Mental Health or Behavioral Health Crisis or need someone to talk to.  Patient verbalizes  understanding of instructions and care plan provided today and agrees to view in MyChart. Active MyChart status and patient understanding of how to access instructions and care plan via MyChart confirmed with patient.     Hendricks Her RN, BSN  [place] I VBCI-Population Health RN Case Information systems manager [phone]

## 2024-01-29 ENCOUNTER — Other Ambulatory Visit: Payer: Self-pay | Admitting: Nurse Practitioner

## 2024-01-29 DIAGNOSIS — G629 Polyneuropathy, unspecified: Secondary | ICD-10-CM

## 2024-01-29 DIAGNOSIS — I739 Peripheral vascular disease, unspecified: Secondary | ICD-10-CM

## 2024-01-29 DIAGNOSIS — I5032 Chronic diastolic (congestive) heart failure: Secondary | ICD-10-CM | POA: Diagnosis not present

## 2024-01-29 DIAGNOSIS — I110 Hypertensive heart disease with heart failure: Secondary | ICD-10-CM | POA: Diagnosis not present

## 2024-02-01 DIAGNOSIS — N3289 Other specified disorders of bladder: Secondary | ICD-10-CM | POA: Diagnosis not present

## 2024-02-01 DIAGNOSIS — I129 Hypertensive chronic kidney disease with stage 1 through stage 4 chronic kidney disease, or unspecified chronic kidney disease: Secondary | ICD-10-CM | POA: Diagnosis not present

## 2024-02-01 DIAGNOSIS — N183 Chronic kidney disease, stage 3 unspecified: Secondary | ICD-10-CM | POA: Diagnosis not present

## 2024-02-01 DIAGNOSIS — M109 Gout, unspecified: Secondary | ICD-10-CM | POA: Diagnosis not present

## 2024-02-01 DIAGNOSIS — E785 Hyperlipidemia, unspecified: Secondary | ICD-10-CM | POA: Diagnosis not present

## 2024-02-01 DIAGNOSIS — E118 Type 2 diabetes mellitus with unspecified complications: Secondary | ICD-10-CM | POA: Diagnosis not present

## 2024-02-02 DIAGNOSIS — I5032 Chronic diastolic (congestive) heart failure: Secondary | ICD-10-CM | POA: Diagnosis not present

## 2024-02-02 DIAGNOSIS — I110 Hypertensive heart disease with heart failure: Secondary | ICD-10-CM | POA: Diagnosis not present

## 2024-02-07 DIAGNOSIS — I5032 Chronic diastolic (congestive) heart failure: Secondary | ICD-10-CM | POA: Diagnosis not present

## 2024-02-07 DIAGNOSIS — I110 Hypertensive heart disease with heart failure: Secondary | ICD-10-CM | POA: Diagnosis not present

## 2024-02-09 ENCOUNTER — Encounter: Payer: Self-pay | Admitting: Podiatry

## 2024-02-09 ENCOUNTER — Ambulatory Visit (INDEPENDENT_AMBULATORY_CARE_PROVIDER_SITE_OTHER): Admitting: Podiatry

## 2024-02-09 VITALS — Ht 73.0 in | Wt 265.0 lb

## 2024-02-09 DIAGNOSIS — L97521 Non-pressure chronic ulcer of other part of left foot limited to breakdown of skin: Secondary | ICD-10-CM

## 2024-02-09 DIAGNOSIS — E1151 Type 2 diabetes mellitus with diabetic peripheral angiopathy without gangrene: Secondary | ICD-10-CM

## 2024-02-09 NOTE — Progress Notes (Signed)
 Chief Complaint  Patient presents with   Diabetic Ulcer    rm 8 Return in about 2 weeks (around 02/01/2024) for Ulcer Check. Ulcer on left foot is closed with minor drainage and some surrounding skin peeling.      HPI: 88 y.o. male presenting today for left foot pressure ulceration.  Seems to be doing pretty well.  He denies any signs and symptoms of infection.  It appears much improved from previous.  Does have concomitant PVD, diabetes, history of CVA, CHF, CKD, lymphedema of left lower extremity history of right below-knee amputation.  Past Medical History:  Diagnosis Date   Acute metabolic encephalopathy 01/16/2020   Amputated below knee (HCC)    right   Anemia    Aspiration pneumonia (HCC) 11/02/2019   Cataract 2019   Chronic kidney disease 2006   Diastolic heart failure (HCC)    Diverticulosis    DM (diabetes mellitus) (HCC)    GERD (gastroesophageal reflux disease) 1995   Gout    Hyperlipemia    OSA on CPAP    RBBB    Sepsis (HCC) 11/02/2019   Sleep apnea 2000   Stroke [facility]) 2002   Systemic hypertension    Thyroid  disease 2022   Past Surgical History:  Procedure Laterality Date   ABDOMINAL AORTOGRAM W/LOWER EXTREMITY Left 08/11/2020   Procedure: ABDOMINAL AORTOGRAM W/LOWER EXTREMITY;  Surgeon: Serene Gaile ORN, MD;  [facility];  Service: Cardiovascular;  Laterality: Left;   ABDOMINAL AORTOGRAM W/LOWER EXTREMITY N/A 02/26/2021   Procedure: ABDOMINAL AORTOGRAM W/LOWER EXTREMITY;  Surgeon: Magda Debby SAILOR, MD;  Location: [facility];  Service: Cardiovascular;  Laterality: N/A;   AMPUTATION Left 09/14/2020   Procedure: AMPUTATION LEFT GREAT TOE;  Surgeon: Silva Juliene SAUNDERS, DPM;  Location: [facility] OR;  Service: Podiatry;  Laterality: Left;   AMPUTATION Left 02/07/2023   Procedure: AMPUTATION TOE 3RD,4TH, and 5TH;  Surgeon: Tobie Franky SQUIBB, DPM;  Location: [facility] OR;  Service: Orthopedics/Podiatry;  Laterality: Left;   AMPUTATION TOE Left 12/03/2021    Procedure: AMPUTATION TOE, LEFT second;  Surgeon: Joya Stabs, DPM;  Location: [facility] OR;  Service: Podiatry;  Laterality: Left;   EYE SURGERY  May & July 2019   One eye at a time   LEG AMPUTATION BELOW KNEE     right   NM MYOCAR PERF WALL MOTION  09/18/2009   no ischemia   PERIPHERAL VASCULAR BALLOON ANGIOPLASTY Left 08/11/2020   Procedure: PERIPHERAL VASCULAR BALLOON ANGIOPLASTY;  Surgeon: Serene Gaile ORN, MD;  [facility];  Service: Cardiovascular;  Laterality: Left;  AT   PERIPHERAL VASCULAR INTERVENTION Left 02/26/2021   Procedure: PERIPHERAL VASCULAR INTERVENTION;  Surgeon: Magda Debby SAILOR, MD;  [facility];  Service: Cardiovascular;  Laterality: Left;   Allergies  Allergen Reactions   Vancomycin  Rash      PHYSICAL EXAM: There were no vitals filed for this visit.  General: The patient is alert and oriented x3 in no acute distress.  Dermatology: Site of previously noted hemorrhagic bulla/pressure ulceration x 2 left lateral and plantar midfoot appear resolved.  Slight skin maceration lateral midfoot.  Vascular: Left foot DP pulse palpable, PT pulse nonpalpable, significant pitting edema +2 present, difficult to palpate pulses due to this.  Capillary refill is intact to the distal stump of the left foot.  Neurological: Light touch sensation decreased. Protective sensation decreased  Musculoskeletal Exam: History of right-sided below-knee amputation, left side amputation of all toes x 5 at  metatarsophalangeal joint level.     Latest Ref Rng & Units 01/08/2024   10:39 AM 09/05/2023   12:13 PM  Hemoglobin A1C  Hemoglobin-A1c 4.8 - 5.6 % 6.7  6.5     ASSESSMENT / PLAN OF CARE: 1. Ulcer of left foot, limited to breakdown of skin (HCC)   2. Type II diabetes mellitus with peripheral circulatory disorder (HCC)      No orders of the defined types were placed in this encounter.  None  Previously noted ulceration appears resolved.  Did paint  Betadine applied padded border foam bandage over area of maceration.  Reviewed offloading with patient.  Weightbearing as tolerated left foot.  Return in about 9 weeks (around 04/12/2024) for Diabetic foot check.   Ethan LITTIE Saddler, DPM, AACFAS [facility]     [address] [place], [place] [address]                Office [phone]  Fax [phone]

## 2024-02-13 ENCOUNTER — Encounter: Payer: Self-pay | Admitting: Nurse Practitioner

## 2024-02-13 ENCOUNTER — Other Ambulatory Visit: Payer: Self-pay

## 2024-02-13 ENCOUNTER — Telehealth: Payer: Self-pay

## 2024-02-13 MED ORDER — DILTIAZEM HCL ER COATED BEADS 180 MG PO CP24: 180.0000 mg | ORAL_CAPSULE | Freq: Every day | ORAL | 1 refills | Status: AC

## 2024-02-13 NOTE — Patient Instructions (Signed)
 [name] - I am sorry I was unable to reach you today for our scheduled appointment. I work with [name], [name] and am calling to support your healthcare needs. Please contact me at [phone] at your earliest convenience. I look forward to speaking with you soon.   Thank you,  Hendricks Her RN, BSN  [place] I VBCI-Population Health RN Case Manager   Direct [phone]

## 2024-02-14 NOTE — Telephone Encounter (Signed)
 Spoke to Micheal Walls, pt is weight bearing as tolerated, per last note in chart. CT

## 2024-02-15 ENCOUNTER — Telehealth: Payer: Self-pay

## 2024-02-15 DIAGNOSIS — I110 Hypertensive heart disease with heart failure: Secondary | ICD-10-CM | POA: Diagnosis not present

## 2024-02-15 DIAGNOSIS — I5032 Chronic diastolic (congestive) heart failure: Secondary | ICD-10-CM | POA: Diagnosis not present

## 2024-02-15 NOTE — Patient Instructions (Signed)
 Micheal Walls - I am sorry I was unable to reach you today for our scheduled appointment. I work with [name], [name] and am calling to support your healthcare needs. Please contact me at [phone] at your earliest convenience. I look forward to speaking with you soon.   Thank you,  Hendricks Her RN, BSN  [place] I VBCI-Population Health RN Case Manager   Direct [phone]

## 2024-02-15 NOTE — Patient Instructions (Signed)
 Micheal Walls - I am sorry I was unable to reach you today for our scheduled appointment. I work with [name], [name] and am calling to support your healthcare needs. Please contact me at [phone] at your earliest convenience. I look forward to speaking with you soon.   Thank you,  Hendricks Her RN, BSN  [place] [facility] VBCI-Population Health RN Case Manager   Direct [phone]

## 2024-02-20 ENCOUNTER — Telehealth: Payer: Self-pay

## 2024-02-20 NOTE — Telephone Encounter (Signed)
 Copied from CRM [id number]. Topic: Clinical - Medication Prior Auth >> Feb 19, 2024  4:52 PM Delon HERO wrote: Reason for CRM: Zelda calling from [facility] calling ask if the fax was received  regarding PA for Back Brace Please advise CB- [phone] / [phone] Fax- [phone]

## 2024-03-16 ENCOUNTER — Emergency Department (HOSPITAL_COMMUNITY)

## 2024-03-16 ENCOUNTER — Encounter: Payer: Self-pay | Admitting: Nurse Practitioner

## 2024-03-16 ENCOUNTER — Encounter (HOSPITAL_COMMUNITY): Payer: Self-pay

## 2024-03-16 ENCOUNTER — Emergency Department (HOSPITAL_COMMUNITY)
Admission: EM | Admit: 2024-03-16 | Discharge: 2024-03-16 | Disposition: A | Discharge status: Home / Self Care | Attending: Emergency Medicine | Admitting: Emergency Medicine

## 2024-03-16 ENCOUNTER — Other Ambulatory Visit: Payer: Self-pay

## 2024-03-16 DIAGNOSIS — Z7982 Long term (current) use of aspirin: Secondary | ICD-10-CM | POA: Diagnosis not present

## 2024-03-16 DIAGNOSIS — E1122 Type 2 diabetes mellitus with diabetic chronic kidney disease: Secondary | ICD-10-CM | POA: Diagnosis not present

## 2024-03-16 DIAGNOSIS — Z7902 Long term (current) use of antithrombotics/antiplatelets: Secondary | ICD-10-CM | POA: Diagnosis not present

## 2024-03-16 DIAGNOSIS — R58 Hemorrhage, not elsewhere classified: Secondary | ICD-10-CM | POA: Diagnosis not present

## 2024-03-16 DIAGNOSIS — S0181XA Laceration without foreign body of other part of head, initial encounter: Secondary | ICD-10-CM

## 2024-03-16 DIAGNOSIS — W01190A Fall on same level from slipping, tripping and stumbling with subsequent striking against furniture, initial encounter: Secondary | ICD-10-CM | POA: Insufficient documentation

## 2024-03-16 DIAGNOSIS — N189 Chronic kidney disease, unspecified: Secondary | ICD-10-CM | POA: Insufficient documentation

## 2024-03-16 DIAGNOSIS — I130 Hypertensive heart and chronic kidney disease with heart failure and stage 1 through stage 4 chronic kidney disease, or unspecified chronic kidney disease: Secondary | ICD-10-CM | POA: Insufficient documentation

## 2024-03-16 DIAGNOSIS — Z8673 Personal history of transient ischemic attack (TIA), and cerebral infarction without residual deficits: Secondary | ICD-10-CM | POA: Insufficient documentation

## 2024-03-16 DIAGNOSIS — W19XXXA Unspecified fall, initial encounter: Secondary | ICD-10-CM

## 2024-03-16 DIAGNOSIS — Z23 Encounter for immunization: Secondary | ICD-10-CM | POA: Insufficient documentation

## 2024-03-16 DIAGNOSIS — S0990XA Unspecified injury of head, initial encounter: Secondary | ICD-10-CM | POA: Diagnosis not present

## 2024-03-16 DIAGNOSIS — S199XXA Unspecified injury of neck, initial encounter: Secondary | ICD-10-CM | POA: Diagnosis not present

## 2024-03-16 DIAGNOSIS — H1132 Conjunctival hemorrhage, left eye: Secondary | ICD-10-CM | POA: Diagnosis not present

## 2024-03-16 DIAGNOSIS — S01122A Laceration with foreign body of left eyelid and periocular area, initial encounter: Secondary | ICD-10-CM | POA: Diagnosis not present

## 2024-03-16 DIAGNOSIS — Y92009 Unspecified place in unspecified non-institutional (private) residence as the place of occurrence of the external cause: Secondary | ICD-10-CM | POA: Insufficient documentation

## 2024-03-16 DIAGNOSIS — S299XXA Unspecified injury of thorax, initial encounter: Secondary | ICD-10-CM | POA: Diagnosis not present

## 2024-03-16 DIAGNOSIS — I509 Heart failure, unspecified: Secondary | ICD-10-CM | POA: Diagnosis not present

## 2024-03-16 DIAGNOSIS — E039 Hypothyroidism, unspecified: Secondary | ICD-10-CM | POA: Diagnosis not present

## 2024-03-16 DIAGNOSIS — S01112A Laceration without foreign body of left eyelid and periocular area, initial encounter: Secondary | ICD-10-CM | POA: Diagnosis not present

## 2024-03-16 DIAGNOSIS — S0993XA Unspecified injury of face, initial encounter: Secondary | ICD-10-CM | POA: Diagnosis not present

## 2024-03-16 DIAGNOSIS — R918 Other nonspecific abnormal finding of lung field: Secondary | ICD-10-CM | POA: Diagnosis not present

## 2024-03-16 LAB — COMPREHENSIVE METABOLIC PANEL WITH GFR
ALT: 34 U/L (ref 0–44)
AST: 39 U/L (ref 15–41)
Albumin: 3 g/dL — ABNORMAL LOW (ref 3.5–5.0)
Alkaline Phosphatase: 79 U/L (ref 38–126)
Anion gap: 12 (ref 5–15)
BUN: 65 mg/dL — ABNORMAL HIGH (ref 8–23)
CO2: 23 mmol/L (ref 22–32)
Calcium: 9.6 mg/dL (ref 8.9–10.3)
Chloride: 101 mmol/L (ref 98–111)
Creatinine, Ser: 2.09 mg/dL — ABNORMAL HIGH (ref 0.61–1.24)
GFR, Estimated: 30 mL/min — ABNORMAL LOW (ref >60)
Glucose, Bld: 115 mg/dL — ABNORMAL HIGH (ref 70–99)
Potassium: 4.3 mmol/L (ref 3.5–5.1)
Sodium: 136 mmol/L (ref 135–145)
Total Bilirubin: 0.5 mg/dL (ref 0.0–1.2)
Total Protein: 7 g/dL (ref 6.5–8.1)

## 2024-03-16 LAB — I-STAT CHEM 8, ED
BUN: 55 mg/dL — ABNORMAL HIGH (ref 8–23)
Calcium, Ion: 1.26 mmol/L (ref 1.15–1.40)
Chloride: 105 mmol/L (ref 98–111)
Creatinine, Ser: 2.2 mg/dL — ABNORMAL HIGH (ref 0.61–1.24)
Glucose, Bld: 119 mg/dL — ABNORMAL HIGH (ref 70–99)
HCT: 34 % — ABNORMAL LOW (ref 39.0–52.0)
Hemoglobin: 11.6 g/dL — ABNORMAL LOW (ref 13.0–17.0)
Potassium: 3.9 mmol/L (ref 3.5–5.1)
Sodium: 138 mmol/L (ref 135–145)
TCO2: 23 mmol/L (ref 22–32)

## 2024-03-16 LAB — SAMPLE TO BLOOD BANK

## 2024-03-16 LAB — PROTIME-INR
INR: 1.1 (ref 0.8–1.2)
Prothrombin Time: 14.4 s (ref 11.4–15.2)

## 2024-03-16 LAB — CBC
HCT: 32.9 % — ABNORMAL LOW (ref 39.0–52.0)
Hemoglobin: 10.5 g/dL — ABNORMAL LOW (ref 13.0–17.0)
MCH: 28.5 pg (ref 26.0–34.0)
MCHC: 31.9 g/dL (ref 30.0–36.0)
MCV: 89.4 fL (ref 80.0–100.0)
Platelets: 209 K/uL (ref 150–400)
RBC: 3.68 MIL/uL — ABNORMAL LOW (ref 4.22–5.81)
RDW: 16.8 % — ABNORMAL HIGH (ref 11.5–15.5)
WBC: 5.8 K/uL (ref 4.0–10.5)
nRBC: 0 % (ref 0.0–0.2)

## 2024-03-16 LAB — I-STAT CG4 LACTIC ACID, ED: Lactic Acid, Venous: 1.8 mmol/L (ref 0.5–1.9)

## 2024-03-16 MED ORDER — ONDANSETRON HCL 4 MG/2ML IJ SOLN: 4.0000 mg | Freq: Once | INTRAMUSCULAR | Status: DC

## 2024-03-16 MED ORDER — TETRACAINE HCL 0.5 % OP SOLN
2.0000 [drp] | Freq: Once | OPHTHALMIC | Status: AC
  Administered 2024-03-16: 2 [drp] via OPHTHALMIC
  Filled 2024-03-16: qty 4

## 2024-03-16 MED ORDER — FLUORESCEIN SODIUM 1 MG OP STRP
1.0000 | ORAL_STRIP | Freq: Once | OPHTHALMIC | Status: AC
  Administered 2024-03-16: 1 via OPHTHALMIC
  Filled 2024-03-16: qty 1

## 2024-03-16 MED ORDER — TETANUS-DIPHTH-ACELL PERTUSSIS 5-2-15.5 LF-MCG/0.5 IM SUSP
0.5000 mL | Freq: Once | INTRAMUSCULAR | Status: AC
  Administered 2024-03-16: 0.5 mL via INTRAMUSCULAR
  Filled 2024-03-16: qty 0.5

## 2024-03-16 MED ORDER — LIDOCAINE-EPINEPHRINE (PF) 2 %-1:200000 IJ SOLN
10.0000 mL | Freq: Once | INTRAMUSCULAR | Status: DC
  Filled 2024-03-16: qty 20

## 2024-03-16 NOTE — Discharge Instructions (Addendum)
 Evaluation today for your fall reveals you had a small laceration above your left eye which was closed with 2 stitches.  Appears that you have a traumatic subconjunctival hemorrhage.  We talked to Dr. Valdemar who is an eye doctor and he is expecting to see you in his office at 8 AM on Monday morning.  Please coordinate that with his office.  If you have worsening vision loss or pain with movement of the eye or any other concerning symptom please return to the ED for further evaluation.  Sutured repair Keep the laceration site dry for the next 24 hours and leave the dressing in place. After 24 hours you may remove the dressing and gently clean the laceration site with antibacterial soap and warm water. Do not scrub the area. Do not soak the area and water for long periods of time. Don't use hydrogen peroxide, iodine -based solutions, or alcohol , which can slow healing, and will probably be painful! Apply topical bacitracin 1-2 times per day for the next 3-5 days. Return to the emergency department in 6 - 8 days for removal of the sutures.  You should return sooner for any signs of infection which would include increased redness around the wound, increased swelling, new drainage of yellow pus.

## 2024-03-16 NOTE — ED Notes (Signed)
 Trauma Response Nurse Documentation   Micheal Walls is a 88 y.o. male arriving to [facility] ED via EMS  On clopidogrel  75 mg daily. Trauma was activated as a Level 2 by ED Charge RN based on the following trauma criteria Elderly patients > 65 with head trauma on anti-coagulation (excluding ASA).  Patient cleared for CT by Dr. Cottie. Pt transported to CT with trauma response nurse present to monitor. RN remained with the patient throughout their absence from the department for clinical observation.   GCS 15.  History   Past Medical History:  Diagnosis Date   Acute metabolic encephalopathy 01/16/2020   Amputated below knee (HCC)    right   Anemia    Aspiration pneumonia (HCC) 11/02/2019   Cataract 2019   Chronic kidney disease 2006   Diastolic heart failure (HCC)    Diverticulosis    DM (diabetes mellitus) (HCC)    GERD (gastroesophageal reflux disease) 1995   Gout    Hyperlipemia    OSA on CPAP    RBBB    Sepsis (HCC) 11/02/2019   Sleep apnea 2000   Stroke (HCC) 2002   Systemic hypertension    Thyroid  disease 2022     Past Surgical History:  Procedure Laterality Date   ABDOMINAL AORTOGRAM W/LOWER EXTREMITY Left 08/11/2020   Procedure: ABDOMINAL AORTOGRAM W/LOWER EXTREMITY;  Surgeon: Serene Gaile ORN, MD;  [facility];  Service: Cardiovascular;  Laterality: Left;   ABDOMINAL AORTOGRAM W/LOWER EXTREMITY N/A 02/26/2021   Procedure: ABDOMINAL AORTOGRAM W/LOWER EXTREMITY;  Surgeon: Magda Debby SAILOR, MD;  [facility];  Service: Cardiovascular;  Laterality: N/A;   AMPUTATION Left 09/14/2020   Procedure: AMPUTATION LEFT GREAT TOE;  Surgeon: Silva Juliene SAUNDERS, DPM;  [facility]: [facility];  Service: Podiatry;  Laterality: Left;   AMPUTATION Left 02/07/2023   Procedure: AMPUTATION TOE 3RD,4TH, and 5TH;  Surgeon: Tobie Franky SQUIBB, DPM;  Location: [facility];  Service: Orthopedics/Podiatry;  Laterality: Left;   AMPUTATION TOE Left 12/03/2021   Procedure:  AMPUTATION TOE, LEFT second;  Surgeon: Joya Stabs, DPM;  [facility]: [facility];  Service: Podiatry;  Laterality: Left;   EYE SURGERY  May & July 2019   One eye at a time   LEG AMPUTATION BELOW KNEE     right   NM MYOCAR PERF WALL MOTION  09/18/2009   no ischemia   PERIPHERAL VASCULAR BALLOON ANGIOPLASTY Left 08/11/2020   Procedure: PERIPHERAL VASCULAR BALLOON ANGIOPLASTY;  Surgeon: Serene Gaile ORN, MD;  [facility];  Service: Cardiovascular;  Laterality: Left;  AT   PERIPHERAL VASCULAR INTERVENTION Left 02/26/2021   Procedure: PERIPHERAL VASCULAR INTERVENTION;  Surgeon: Magda Debby SAILOR, MD;  [facility];  Service: Cardiovascular;  Laterality: Left;     Initial Focused Assessment (If applicable, or please see trauma documentation): Airway: Intact, patent Breathing: Breath sounds clear, equal bilaterally. No SOB or CP. SpO2 100% on RA. Circulation: Laceration/bleeding to L eye/eyelid. Bleeding but controlled.  Sclera red and vision blurred.  20G PIV to L hand Disability: MAE equally with equal sensation throughout. L eye red, bloody and vision blurred.   CT's Completed:   CT Head, CT Maxillofacial, and CT C-Spine   Interventions:  CXR Labs drawn CT head, c-spine and max face.  Tdap given Visual screening performed  Ophtho consulted  Plan for disposition:  Other Awaiting plan of care from ophthalmology.   Consults completed:  Ophthalmology paged at 1246.   Event Summary:  BIB EMS after having a fall while attempting to get out of his bed.  Pt lost his balance and hit his left eye on the dresser. Pt on plavix . No LOC.   Bedside handoff with ED RN Dena.    Micheal Walls  Trauma Response RN  Please call TRN at [phone] for further assistance.

## 2024-03-16 NOTE — ED Triage Notes (Signed)
 Pt bib ems from. Pt was getting out of bed when he lost his balance and hit his left eye on the dresser. Pt on Plavix . Denies any other injuries. No LOC

## 2024-03-16 NOTE — ED Provider Notes (Signed)
 [place] EMERGENCY DEPARTMENT AT [facility] Provider Note   CSN: [id number] Arrival date & time: 03/16/24  0945     Patient presents with: Fall  HPI Micheal Walls is a 88 y.o. male with CKD, stroke, right BKA, CHF, HTN, diabetes, and hypothyroidism presenting for fall.  Occurred this morning.  He states he was trying to get up out of bed when he caught his foot on the bed frame causing him to fall forward and hit the left corner of his upper face on the corner of the end table.  Denies LOC.  Reports that he is on Plavix  and taking as prescribed.  He denies any preceding chest pain, palpitations or shortness of breath.  Endorses pain and swelling in and around the left eye.  States he is feeling somewhat dizzy.  Denies chest pain abdominal pain, back pain or any lower extremity pain.  He reports bad vision in the right eye.    Fall       Prior to Admission medications   Medication Sig Start Date End Date Taking? Authorizing Provider  allopurinol  (ZYLOPRIM ) 100 MG tablet Take 100 mg by mouth daily.    [provider]  aspirin  EC 81 MG tablet Take 81 mg by mouth daily. Swallow whole.    [provider]  colchicine  0.6 MG tablet Take 0.6 mg by mouth once a week. 11/16/20   [provider]  Continuous Glucose Sensor (FREESTYLE LIBRE 14 DAY SENSOR) MISC USE AS DIRECTED TO CHECK BLOOD SUGARS. CHANGE EVERY 14 DAYS 01/11/24   [name], [name]  diltiazem  (CARDIZEM  CD) 180 MG 24 hr capsule Take 1 capsule (180 mg total) by mouth daily. 02/13/24 03/14/24  [name], [name]  dorzolamide  (TRUSOPT ) 2 % ophthalmic solution Place 1 drop into both eyes 2 (two) times daily.    [provider]  ferrous sulfate  325 (65 FE) MG tablet Take 1 tablet (325 mg total) by mouth 2 (two) times daily with a meal. 12/04/21   [name], [name], MD  furosemide  (LASIX ) 40 MG tablet TAKE ONE (1) TABLET BY MOUTH TWICE DAILY 01/29/24   [name], [name]  gabapentin   (NEURONTIN ) 100 MG capsule TAKE TWO (2) CAPSULES BY MOUTH IN THE MORNING, TAKE 1 CAPSULE AT NOON AND 1 CAPSULE EVERY EVENING 01/29/24   [name], [name]  glucose blood (FREESTYLE PRECISION NEO TEST) test strip Use as instructed 07/30/19   [name], [name]  guaiFENesin  (ROBITUSSIN) 100 MG/5ML liquid Take 10 mLs by mouth every 8 (eight) hours as needed for cough or to loosen phlegm. Patient not taking: Reported on 01/23/2024 12/15/23   [name], MD  latanoprost  (XALATAN ) 0.005 % ophthalmic solution Place 1 drop into both eyes at bedtime.  10/12/18   [provider]  Multiple Vitamins-Minerals (PRESERVISION AREDS) CAPS Take 2 capsules by mouth daily.    [provider]  pantoprazole  (PROTONIX ) 40 MG tablet TAKE 1 TABLET BY MOUTH EVERY DAY AT BEDTIME 12/28/23   Moore, Janece, [name]  rosuvastatin  (CRESTOR ) 20 MG tablet TAKE 1 TABLET (20MG  TOTAL) BY MOUTH AT BEDTIME 10/30/23   [name], [name], MD  Semaglutide , 2 MG/DOSE, (OZEMPIC , 2 MG/DOSE,) 8 MG/3ML SOPN INJECT 2 MG INTO THE SKIN ONCE A WEEK. (DX: E11.22) 01/11/24   [name], [name]  senna-docusate (SENOKOT-S) 8.6-50 MG tablet Take 1 tablet by mouth 2 (two) times daily between meals as needed for mild constipation or moderate constipation. 10/24/20   [name], [name], MD  SYNTHROID  25 MCG  tablet Take 1 tablet by mouth Monday - Friday 10/24/23   Moore, Janece, [name]  tamsulosin  (FLOMAX ) 0.4 MG CAPS capsule Take 0.4 mg by mouth daily. Patient not taking: Reported on 01/23/2024    [provider]    Allergies: Vancomycin     Review of Systems See HPI   Physical Exam   Vitals:   03/16/24 0951 03/16/24 1300  BP: (!) 140/60 (!) 148/67  Pulse: 84 87  Resp: 18 16  Temp: 98.1 F (36.7 C)   SpO2: 99% 99%    CONSTITUTIONAL:  well-appearing, NAD NEURO:  Alert and oriented x 3, CN 3-12 grossly intact EYES:  eyes equal and reactive, left eye is injected, no hyphema.  Eye movement appears normal.  Surrounding periorbital  swelling.  Negative Sidel sign and no corneal abrasion on fluorescein  stain and Woods lamp inspection.  Had him close his right eye and he was able to see with his left eye coming fingers I was holding up approximately 2-1/2 feet in front of him.   Visual Acuity  Right Eye Distance: 100 Left Eye Distance: Cannot see Bilateral Distance: 80  Right Eye Near: R Near: 20 Left Eye Near:  L Near: Cannot see Bilateral Near:  20   ENT/NECK:  Supple, no stridor, atraumatic  CARDIO:  regular rate and rhythm, appears well-perfused  PULM:  No respiratory distress, CTAB GI/GU:  non-distended, soft non tender MSK/SPINE:  No gross deformities, no edema, moves all extremities  SKIN:  no rash, atraumatic   *Additional and/or pertinent findings included in MDM below    (all labs ordered are listed, but only abnormal results are displayed) Labs Reviewed  COMPREHENSIVE METABOLIC PANEL WITH GFR - Abnormal; Notable for the following components:      Result Value   Glucose, Bld 115 (*)    BUN 65 (*)    Creatinine, Ser 2.09 (*)    Albumin  3.0 (*)    GFR, Estimated 30 (*)    All other components within normal limits  CBC - Abnormal; Notable for the following components:   RBC 3.68 (*)    Hemoglobin 10.5 (*)    HCT 32.9 (*)    RDW 16.8 (*)    All other components within normal limits  I-STAT CHEM 8, ED - Abnormal; Notable for the following components:   BUN 55 (*)    Creatinine, Ser 2.20 (*)    Glucose, Bld 119 (*)    Hemoglobin 11.6 (*)    HCT 34.0 (*)    All other components within normal limits  PROTIME-INR  ETHANOL  I-STAT CG4 LACTIC ACID, ED  SAMPLE TO BLOOD BANK    EKG: None  Radiology: CT HEAD WO CONTRAST Result Date: 03/16/2024 CLINICAL DATA:  Head trauma, moderate-severe; Polytrauma, blunt; Facial trauma, blunt. Fall EXAM: CT HEAD WITHOUT CONTRAST CT MAXILLOFACIAL WITHOUT CONTRAST CT CERVICAL SPINE WITHOUT CONTRAST TECHNIQUE: Multidetector CT imaging of the head, cervical  spine, and maxillofacial structures were performed using the standard protocol without intravenous contrast. Multiplanar CT image reconstructions of the cervical spine and maxillofacial structures were also generated. RADIATION DOSE REDUCTION: This exam was performed according to the departmental dose-optimization program which includes automated exposure control, adjustment of the mA and/or kV according to patient size and/or use of iterative reconstruction technique. COMPARISON:  None Available. FINDINGS: CT HEAD FINDINGS Brain: Bilateral cerebellar chronic infarctions. No evidence of large-territorial acute infarction. No parenchymal hemorrhage. No mass lesion. No extra-axial collection. No mass effect or midline shift. No hydrocephalus. Basilar  cisterns are patent. Vascular: No hyperdense vessel. Atherosclerotic calcifications are present within the cavernous internal carotid and vertebral arteries. Skull: No acute fracture or focal lesion. Other: None. CT MAXILLOFACIAL FINDINGS Osseous: No fracture or mandibular dislocation. No destructive process. Right acromioclavicular joint degenerative changes. Sinuses/Orbits: Complete opacification of left maxillary sinus with associated calcifications. Paranasal sinuses and mastoid air cells are clear. Bilateral lens replacement. Otherwise the orbits are unremarkable. Soft tissues: Negative. CT CERVICAL SPINE FINDINGS Alignment: Grade 1 anterolisthesis of C2 on C3. Grade 2 anterolisthesis of C3 on C4. Skull base and vertebrae: Multilevel moderate degenerative changes of the spine. Associated severe osseous neural foraminal stenosis at the C3-C4 levels. No acute fracture. No aggressive appearing focal osseous lesion or focal pathologic process. Soft tissues and spinal canal: No prevertebral fluid or swelling. No visible canal hematoma. Upper chest: Unremarkable. Other: None. IMPRESSION: 1. No acute intracranial abnormality in a patient with bilateral cerebellar  encephalomalacia. 2.  No acute displaced facial fracture. 3. No acute displaced fracture or traumatic listhesis of the cervical spine. 4. Grade 2 anterolisthesis of C3 on C4 and moderate degenerative changes leading to severe osseous neural foraminal stenosis at this level. 5. Complete opacification left maxillary sinus with associated calcifications which may be due to chronic versus fungal disease. Electronically Signed   By: Morgane  Naveau M.D.   On: 03/16/2024 11:10   CT MAXILLOFACIAL WO CONTRAST Result Date: 03/16/2024 CLINICAL DATA:  Head trauma, moderate-severe; Polytrauma, blunt; Facial trauma, blunt. Fall EXAM: CT HEAD WITHOUT CONTRAST CT MAXILLOFACIAL WITHOUT CONTRAST CT CERVICAL SPINE WITHOUT CONTRAST TECHNIQUE: Multidetector CT imaging of the head, cervical spine, and maxillofacial structures were performed using the standard protocol without intravenous contrast. Multiplanar CT image reconstructions of the cervical spine and maxillofacial structures were also generated. RADIATION DOSE REDUCTION: This exam was performed according to the departmental dose-optimization program which includes automated exposure control, adjustment of the mA and/or kV according to patient size and/or use of iterative reconstruction technique. COMPARISON:  None Available. FINDINGS: CT HEAD FINDINGS Brain: Bilateral cerebellar chronic infarctions. No evidence of large-territorial acute infarction. No parenchymal hemorrhage. No mass lesion. No extra-axial collection. No mass effect or midline shift. No hydrocephalus. Basilar cisterns are patent. Vascular: No hyperdense vessel. Atherosclerotic calcifications are present within the cavernous internal carotid and vertebral arteries. Skull: No acute fracture or focal lesion. Other: None. CT MAXILLOFACIAL FINDINGS Osseous: No fracture or mandibular dislocation. No destructive process. Right acromioclavicular joint degenerative changes. Sinuses/Orbits: Complete opacification of  left maxillary sinus with associated calcifications. Paranasal sinuses and mastoid air cells are clear. Bilateral lens replacement. Otherwise the orbits are unremarkable. Soft tissues: Negative. CT CERVICAL SPINE FINDINGS Alignment: Grade 1 anterolisthesis of C2 on C3. Grade 2 anterolisthesis of C3 on C4. Skull base and vertebrae: Multilevel moderate degenerative changes of the spine. Associated severe osseous neural foraminal stenosis at the C3-C4 levels. No acute fracture. No aggressive appearing focal osseous lesion or focal pathologic process. Soft tissues and spinal canal: No prevertebral fluid or swelling. No visible canal hematoma. Upper chest: Unremarkable. Other: None. IMPRESSION: 1. No acute intracranial abnormality in a patient with bilateral cerebellar encephalomalacia. 2.  No acute displaced facial fracture. 3. No acute displaced fracture or traumatic listhesis of the cervical spine. 4. Grade 2 anterolisthesis of C3 on C4 and moderate degenerative changes leading to severe osseous neural foraminal stenosis at this level. 5. Complete opacification left maxillary sinus with associated calcifications which may be due to chronic versus fungal disease. Electronically Signed   By: Morgane  Margarite M.D.   On: 03/16/2024 11:10   CT CERVICAL SPINE WO CONTRAST Result Date: 03/16/2024 CLINICAL DATA:  Head trauma, moderate-severe; Polytrauma, blunt; Facial trauma, blunt. Fall EXAM: CT HEAD WITHOUT CONTRAST CT MAXILLOFACIAL WITHOUT CONTRAST CT CERVICAL SPINE WITHOUT CONTRAST TECHNIQUE: Multidetector CT imaging of the head, cervical spine, and maxillofacial structures were performed using the standard protocol without intravenous contrast. Multiplanar CT image reconstructions of the cervical spine and maxillofacial structures were also generated. RADIATION DOSE REDUCTION: This exam was performed according to the departmental dose-optimization program which includes automated exposure control, adjustment of the mA  and/or kV according to patient size and/or use of iterative reconstruction technique. COMPARISON:  None Available. FINDINGS: CT HEAD FINDINGS Brain: Bilateral cerebellar chronic infarctions. No evidence of large-territorial acute infarction. No parenchymal hemorrhage. No mass lesion. No extra-axial collection. No mass effect or midline shift. No hydrocephalus. Basilar cisterns are patent. Vascular: No hyperdense vessel. Atherosclerotic calcifications are present within the cavernous internal carotid and vertebral arteries. Skull: No acute fracture or focal lesion. Other: None. CT MAXILLOFACIAL FINDINGS Osseous: No fracture or mandibular dislocation. No destructive process. Right acromioclavicular joint degenerative changes. Sinuses/Orbits: Complete opacification of left maxillary sinus with associated calcifications. Paranasal sinuses and mastoid air cells are clear. Bilateral lens replacement. Otherwise the orbits are unremarkable. Soft tissues: Negative. CT CERVICAL SPINE FINDINGS Alignment: Grade 1 anterolisthesis of C2 on C3. Grade 2 anterolisthesis of C3 on C4. Skull base and vertebrae: Multilevel moderate degenerative changes of the spine. Associated severe osseous neural foraminal stenosis at the C3-C4 levels. No acute fracture. No aggressive appearing focal osseous lesion or focal pathologic process. Soft tissues and spinal canal: No prevertebral fluid or swelling. No visible canal hematoma. Upper chest: Unremarkable. Other: None. IMPRESSION: 1. No acute intracranial abnormality in a patient with bilateral cerebellar encephalomalacia. 2.  No acute displaced facial fracture. 3. No acute displaced fracture or traumatic listhesis of the cervical spine. 4. Grade 2 anterolisthesis of C3 on C4 and moderate degenerative changes leading to severe osseous neural foraminal stenosis at this level. 5. Complete opacification left maxillary sinus with associated calcifications which may be due to chronic versus fungal  disease. Electronically Signed   By: Morgane  Naveau M.D.   On: 03/16/2024 11:10   DG Chest Port 1 View Result Date: 03/16/2024 CLINICAL DATA:  Fall.  Trauma. EXAM: PORTABLE CHEST 1 VIEW COMPARISON:  12/10/2023 FINDINGS: Mild rightward patient rotation. The cardio pericardial silhouette is enlarged. Low volume film basilar atelectasis or infiltrate. Patchy airspace disease seen previously in the right upper lobe is not evident on the current study. Telemetry leads overlie the chest. IMPRESSION: Low volume film with basilar atelectasis or infiltrate. Electronically Signed   By: Camellia Candle M.D.   On: 03/16/2024 10:22     .Laceration Repair  Date/Time: 03/16/2024 1:50 PM  Performed by: Lang Norleen POUR, PA-C Authorized by: Lang Norleen POUR, PA-C   Consent:    Consent obtained:  Verbal   Consent given by:  Patient   Risks discussed:  Infection and need for additional repair   Alternatives discussed:  No treatment Universal protocol:    Procedure explained and questions answered to patient or proxy's satisfaction: yes     Relevant documents present and verified: yes     Test results available: yes     Patient identity confirmed:  Verbally with patient and hospital-assigned identification number Anesthesia:    Anesthesia method:  Local infiltration   Local anesthetic:  Lidocaine  1% WITH epi Laceration details:  Location:  Face   Face location:  L upper eyelid   Extent:  Superficial   Length (cm):  0.5   Depth (mm):  4 Pre-procedure details:    Preparation:  Patient was prepped and draped in usual sterile fashion Exploration:    Limited defect created (wound extended): no     Hemostasis achieved with:  Direct pressure   Imaging obtained comment:  CT   Imaging outcome: foreign body noted     Wound extent: areolar tissue violated   Treatment:    Area cleansed with:  Shur-Clens and saline   Amount of cleaning:  Extensive   Irrigation solution:  Sterile water and sterile saline    Debridement:  None   Undermining:  None Skin repair:    Repair method:  Sutures   Suture size:  5-0   Suture material:  Nylon   Suture technique:  Simple interrupted   Number of sutures:  2 Approximation:    Approximation:  Close Repair type:    Repair type:  Intermediate Post-procedure details:    Dressing:  Open (no dressing)   Procedure completion:  Tolerated well, no immediate complications    Medications Ordered in the ED  lidocaine -EPINEPHrine  (XYLOCAINE  W/EPI) 2 %-1:200000 (PF) injection 10 mL (has no administration in time range)  fluorescein  ophthalmic strip 1 strip (1 strip Left Eye Given 03/16/24 1014)  tetracaine (PONTOCAINE) 0.5 % ophthalmic solution 2 drop (2 drops Left Eye Given 03/16/24 1015)  Tdap (ADACEL) injection 0.5 mL (0.5 mLs Intramuscular Given 03/16/24 1041)    Clinical Course as of 03/16/24 1408  Sat Mar 16, 2024  1054 This is an 88 year old gentleman presents with mechanical fall at home, striking his LEFT eye on a hard surface prior to coming in.  Patient reports he follows up from allergy as an outpatient and has limited vision in his right eye.  Since hitting his head he does have some blurred vision in his left eye.  He is on Plavix , no anticoagulation.   On exam he appears to have an isolated injury of the face with a small laceration overlying the left eye, vision is grossly intact a large lettering but visual acuity testing is pending.  He has subconjunctival hemorrhage of the left eye but no traumatic hyphema.  No abnormal Seidel testing to suggest globe rupture.  Pending traumatic CT imaging of the head and visual acuity testing. [MT]  1122 No emergent traumatic findings on CT imaging.  Creatinine at baseline levels.  Labs are otherwise unremarkable per my review, some chronic stable anemia.  Lactate normal [MT]  1255 Dr Valdemar ophthalmology, I spoke by phone and he was able to review the patient's media tab imaging and CT imaging.  Based on this  presentation, we think the patient is reasonably safe and stable for close office follow-up Monday morning at 8 AM in the office, when he will be seen by the ophthalmologist.  This will be relayed to the patient's daughter, to ensure that the patient can make it to his appointment.  Patient was reassessed and continues to have gross vision intact, although there is some blurred vision and worsening of his chronic vision.  He is also not wearing his corrective lenses here in the ED.  But I have a low suspicion for traumatic retinal detachment, traumatic hyphema, corneal ulceration [MT]    Clinical Course User Index [MT] Trifan, Donnice PARAS, MD  Medical Decision Making Amount and/or Complexity of Data Reviewed Labs: ordered. Radiology: ordered.  Risk Prescription drug management.   88 year old well-appearing male presenting for mechanical fall.  Exam concerning for trauma to the left eye with left medial tiny laceration.  Visual acuity revealed concern for vision loss in the left eye.  Considered globe rupture and acute lens injury but were reassured after physical exam findings.  Does appear that he has a traumatic subconjunctival hemorrhage.  Discussed patient with ophthalmology, Dr. Valdemar advised to have him follow-up in his office at 8 AM on Monday morning.  CT scans and x-rays were nonacute.  There was some evidence of opacification in the maxillary sinus that appears chronic versus fungal.  Shared these findings with the patient and advised to follow-up with his PCP.  Left upper periorbital laceration repair was well-tolerated.  Advised treatment at home.  Discussed with his daughter over the phone to schedule appointment with Dr. Valdemar.  Discussed return precautions.  Discharged good condition.      Final diagnoses:  Fall, initial encounter  Laceration of periorbital area, initial encounter  Subconjunctival hemorrhage of left eye    ED Discharge Orders      None          Lang Norleen POUR, PA-C 03/16/24 1409    Cottie Donnice PARAS, MD 03/16/24 1539

## 2024-03-18 ENCOUNTER — Ambulatory Visit (INDEPENDENT_AMBULATORY_CARE_PROVIDER_SITE_OTHER): Admitting: Ophthalmology

## 2024-03-18 ENCOUNTER — Ambulatory Visit: Payer: Self-pay | Admitting: Nurse Practitioner

## 2024-03-18 ENCOUNTER — Encounter (INDEPENDENT_AMBULATORY_CARE_PROVIDER_SITE_OTHER): Payer: Self-pay | Admitting: Ophthalmology

## 2024-03-18 DIAGNOSIS — Z7985 Long-term (current) use of injectable non-insulin antidiabetic drugs: Secondary | ICD-10-CM | POA: Diagnosis not present

## 2024-03-18 DIAGNOSIS — E113593 Type 2 diabetes mellitus with proliferative diabetic retinopathy without macular edema, bilateral: Secondary | ICD-10-CM | POA: Diagnosis not present

## 2024-03-18 DIAGNOSIS — S058X2A Other injuries of left eye and orbit, initial encounter: Secondary | ICD-10-CM | POA: Diagnosis not present

## 2024-03-18 DIAGNOSIS — H35033 Hypertensive retinopathy, bilateral: Secondary | ICD-10-CM | POA: Diagnosis not present

## 2024-03-18 DIAGNOSIS — E119 Type 2 diabetes mellitus without complications: Secondary | ICD-10-CM

## 2024-03-18 DIAGNOSIS — Z961 Presence of intraocular lens: Secondary | ICD-10-CM

## 2024-03-18 DIAGNOSIS — R600 Localized edema: Secondary | ICD-10-CM | POA: Diagnosis not present

## 2024-03-18 DIAGNOSIS — I10 Essential (primary) hypertension: Secondary | ICD-10-CM

## 2024-03-18 NOTE — Progress Notes (Signed)
 [facility] - Clinic Note  03/18/2024   CHIEF COMPLAINT Patient presents for Retina Evaluation and Eye Injury  HISTORY OF PRESENT ILLNESS: Micheal Walls is a 88 y.o. male who presents to the clinic today for:  HPI     Retina Evaluation   This started 2 days ago.  Duration of 2 days.  Response to treatment was no improvement.  I, the attending physician,  performed the HPI with the patient and updated documentation appropriately.        Eye Injury   In left eye.  Type of trauma is fall.  Associated signs and symptoms include blurred vision, lid swelling and bruising.  Negative for floaters and flashing lights.  Since onset it is gradually improving.  I, the attending physician,  performed the HPI with the patient and updated documentation appropriately.        Comments   Pt states 2 days ago he fell out of bed and hit OS on the corner of his bedside table. Pt was taken to the ER, had some stitches on the inner corner of OS. Pt states OS is swollen and hard to open. VA in OS went down almost immediately, couldn't see much. Feels it is improving w/ the swelling going down. Pt can make out some light in the room when he forces OS open. Pt has hx of Diabetes. Last A1C in July was 6.7. Pt takes Ozempic . Pt sees Dr. Elner and also seen @ [facility]. Uses gtts for pressure, dorzolamide  BID OU and Xalatan  at bedtime OU. Pt does have glaucoma. Pt has had inj w/ Dr. Elner previously, usually every six months he has an appt.       Last edited by Valdemar Rogue, MD on 03/18/2024 12:17 PM.    Patient states   Referring physician: [name], [name] [address] [place],  [place] [address]  HISTORICAL INFORMATION:  Selected notes from the MEDICAL RECORD NUMBER ED f/u for traumatic eye injury OS -- fell out of bed onto nightstand LEE:  Ocular Hx- follows w/ [facility]-   CURRENT MEDICATIONS: Current Outpatient Medications (Ophthalmic Drugs)   Medication Sig   dorzolamide  (TRUSOPT ) 2 % ophthalmic solution Place 1 drop into both eyes 2 (two) times daily.   latanoprost  (XALATAN ) 0.005 % ophthalmic solution Place 1 drop into both eyes at bedtime.    No current facility-administered medications for this visit. (Ophthalmic Drugs)   Current Outpatient Medications (Other)  Medication Sig   allopurinol  (ZYLOPRIM ) 100 MG tablet Take 100 mg by mouth daily.   aspirin  EC 81 MG tablet Take 81 mg by mouth daily. Swallow whole.   colchicine  0.6 MG tablet Take 0.6 mg by mouth once a week.   Continuous Glucose Sensor (FREESTYLE LIBRE 14 DAY SENSOR) MISC USE AS DIRECTED TO CHECK BLOOD SUGARS. CHANGE EVERY 14 DAYS   diltiazem  (CARDIZEM  CD) 180 MG 24 hr capsule Take 1 capsule (180 mg total) by mouth daily.   ferrous sulfate  325 (65 FE) MG tablet Take 1 tablet (325 mg total) by mouth 2 (two) times daily with a meal.   furosemide  (LASIX ) 40 MG tablet TAKE ONE (1) TABLET BY MOUTH TWICE DAILY   gabapentin  (NEURONTIN ) 100 MG capsule TAKE TWO (2) CAPSULES BY MOUTH IN THE MORNING, TAKE 1 CAPSULE AT NOON AND 1 CAPSULE EVERY EVENING   glucose blood (FREESTYLE PRECISION NEO TEST) test strip Use as instructed   Multiple Vitamins-Minerals (PRESERVISION AREDS) CAPS Take  2 capsules by mouth daily.   pantoprazole  (PROTONIX ) 40 MG tablet TAKE 1 TABLET BY MOUTH EVERY DAY AT BEDTIME   rosuvastatin  (CRESTOR ) 20 MG tablet TAKE 1 TABLET (20MG  TOTAL) BY MOUTH AT BEDTIME   Semaglutide , 2 MG/DOSE, (OZEMPIC , 2 MG/DOSE,) 8 MG/3ML SOPN INJECT 2 MG INTO THE SKIN ONCE A WEEK. (DX: E11.22)   senna-docusate (SENOKOT-S) 8.6-50 MG tablet Take 1 tablet by mouth 2 (two) times daily between meals as needed for mild constipation or moderate constipation.   SYNTHROID  25 MCG tablet Take 1 tablet by mouth Monday - Friday   guaiFENesin  (ROBITUSSIN) 100 MG/5ML liquid Take 10 mLs by mouth every 8 (eight) hours as needed for cough or to loosen phlegm. (Patient not taking: Reported on  03/18/2024)   tamsulosin  (FLOMAX ) 0.4 MG CAPS capsule Take 0.4 mg by mouth daily. (Patient not taking: Reported on 03/18/2024)   No current facility-administered medications for this visit. (Other)   REVIEW OF SYSTEMS: ROS   Positive for: Endocrine, Cardiovascular, Eyes Negative for: Constitutional, Gastrointestinal, Neurological, Skin, Genitourinary, Musculoskeletal, HENT, Respiratory, Psychiatric, Allergic/Imm, Heme/Lymph Last edited by Antonetta Almetta BRAVO, COT on 03/18/2024  8:16 AM.     ALLERGIES Allergies  Allergen Reactions   Vancomycin  Rash   PAST MEDICAL HISTORY Past Medical History:  Diagnosis Date   Acute metabolic encephalopathy 01/16/2020   Amputated below knee (HCC)    right   Anemia    Aspiration pneumonia (HCC) 11/02/2019   Cataract 2019   Chronic kidney disease 2006   Diastolic heart failure (HCC)    Diverticulosis    DM (diabetes mellitus) (HCC)    GERD (gastroesophageal reflux disease) 1995   Glaucoma    Gout    Hyperlipemia    OSA on CPAP    RBBB    Sepsis (HCC) 11/02/2019   Sleep apnea 2000   Stroke (HCC) 2002   Systemic hypertension    Thyroid  disease 2022   Past Surgical History:  Procedure Laterality Date   ABDOMINAL AORTOGRAM W/LOWER EXTREMITY Left 08/11/2020   Procedure: ABDOMINAL AORTOGRAM W/LOWER EXTREMITY;  Surgeon: Serene Gaile ORN, MD;  Location: [facility];  Service: Cardiovascular;  Laterality: Left;   ABDOMINAL AORTOGRAM W/LOWER EXTREMITY N/A 02/26/2021   Procedure: ABDOMINAL AORTOGRAM W/LOWER EXTREMITY;  Surgeon: Magda Debby SAILOR, MD;  Location: [facility];  Service: Cardiovascular;  Laterality: N/A;   AMPUTATION Left 09/14/2020   Procedure: AMPUTATION LEFT GREAT TOE;  Surgeon: Silva Juliene SAUNDERS, DPM;  Location: [facility] OR;  Service: Podiatry;  Laterality: Left;   AMPUTATION Left 02/07/2023   Procedure: AMPUTATION TOE 3RD,4TH, and 5TH;  Surgeon: Tobie Franky SQUIBB, DPM;  Location: [facility] OR;  Service: Orthopedics/Podiatry;   Laterality: Left;   AMPUTATION TOE Left 12/03/2021   Procedure: AMPUTATION TOE, LEFT second;  Surgeon: Joya Stabs, DPM;  Location: [facility] OR;  Service: Podiatry;  Laterality: Left;   EYE SURGERY  May & July 2019   One eye at a time   LEG AMPUTATION BELOW KNEE     right   NM MYOCAR PERF WALL MOTION  09/18/2009   no ischemia   PERIPHERAL VASCULAR BALLOON ANGIOPLASTY Left 08/11/2020   Procedure: PERIPHERAL VASCULAR BALLOON ANGIOPLASTY;  Surgeon: Serene Gaile ORN, MD;  Location: [facility];  Service: Cardiovascular;  Laterality: Left;  AT   PERIPHERAL VASCULAR INTERVENTION Left 02/26/2021   Procedure: PERIPHERAL VASCULAR INTERVENTION;  Surgeon: Magda Debby SAILOR, MD;  Location: [facility];  Service: Cardiovascular;  Laterality: Left;   FAMILY  HISTORY Family History  Problem Relation Age of Onset   Diabetes Mother    Heart attack Father    Cancer Sister    Asthma Daughter    SOCIAL HISTORY Social History   Tobacco Use   Smoking status: Former    Current packs/day: 0.00    Average packs/day: 1 pack/day for 25.0 years (25.0 ttl pk-yrs)    Types: Cigars, Cigarettes    Quit date: 06/12/2001    Years since quitting: 22.7   Smokeless tobacco: Never  Vaping Use   Vaping status: Former  Substance Use Topics   Alcohol  use: No   Drug use: No       OPHTHALMIC EXAM:  Base Eye Exam     Visual Acuity (Snellen - Linear)       Right Left   Dist cc 20/60 20/50   Dist ph cc 20/40 -1 NI    Correction: Glasses  Pt had to hold up OS eyelid to see letters MS        Tonometry (Tonopen, 8:31 AM)       Right Left   Pressure 16 13         Pupils       Pupils Dark Light Shape React APD   Right PERRL 3 2 Round Brisk None   Left PERRL 3 2 Round Brisk None         Visual Fields       Left Right    Full Full         Extraocular Movement       Right Left    Full, Ortho Full, Ortho         Neuro/Psych     Oriented x3: Yes   Mood/Affect: Normal          Dilation     Both eyes: 1.0% Mydriacyl, 2.5% Phenylephrine  @ 8:32 AM           Slit Lamp and Fundus Exam     External Exam       Right Left   External Normal Ecchymosis, Periorbital edema, sutures upper lid         Slit Lamp Exam       Right Left   Lids/Lashes Dermatochalasis - upper lid Ecchymosis, Periorbital edema, 2 nylon sutures in tact nasal upper lid   Conjunctiva/Sclera White and quiet 2+ Injection nasally   Cornea Arcus, Well healed cataract wound Arcus, Well healed cataract wound, focal corneal haze 0900   Anterior Chamber Deep and clear Deep and clear, No cells or flare   Iris Round and moderately dilated Round and moderately dilated   Lens PC IOL in good postition PC IOL in good postition   Anterior Vitreous Vitreous syneresis Vitreous syneresis         Fundus Exam       Right Left   Posterior Vitreous Normal Normal   Disc Sharp rim, PPP, 3+ pallor 2+ Pallor, Sharp rim, PPP   C/D Ratio 0.4 0.3   Macula Flat, Blunted foveal reflex, + pigmented atrophy, No heme or edema Flat, Blunted foveal reflex, RPE mottling, No heme or edema   Vessels Vascular attenuation, Tortuous Vascular attenuation, Tortuous   Periphery Attached, very mild PRP changes room for fill in Scattered PRP room for fill in, RAM w/ + SRH and good laser surrounding 0130 equator           Refraction     Wearing Rx       Sphere  Cylinder Axis Add   Right -0.25 +1.25 007 +2.25   Left -1.00 +3.75 151 +1.75         Manifest Refraction   Unable to refract, put unable to hold OS open w. phoropter MS          IMAGING AND PROCEDURES  Imaging and Procedures for 03/18/2024  OCT, Retina - OU - Both Eyes       Right Eye Quality was borderline. Scan locations included subfoveal. Central Foveal Thickness: 257. Progression has improved. Findings include no IRF, no SRF, abnormal foveal contour, retinal drusen , outer retinal atrophy (Central ORA w/ trace cystic changes).    Left Eye Quality was good. Scan locations included subfoveal. Central Foveal Thickness: 211. Progression has been stable. Findings include normal foveal contour, no IRF, no SRF, epiretinal membrane (Diffused atrophy, trace ERM).   Notes   *Images captured and stored on drive  Diagnosis / Impression:  OD: Central ORA w/ trace cystic changes OS: Diffused atrophy, trace ERM  Clinical management:  See below  Abbreviations: NFP - Normal foveal profile. CME - cystoid macular edema. PED - pigment epithelial detachment. IRF - intraretinal fluid. SRF - subretinal fluid. EZ - ellipsoid zone. ERM - epiretinal membrane. ORA - outer retinal atrophy. ORT - outer retinal tubulation. SRHM - subretinal hyper-reflective material. IRHM - intraretinal hyper-reflective material           ASSESSMENT/PLAN:   ICD-10-CM   1. Blunt trauma of left eye, initial encounter  S05.8X2A     2. Periorbital edema of left eye  R60.0     3. Proliferative diabetic retinopathy of both eyes without macular edema associated with type 2 diabetes mellitus (HCC)  E11.3593 OCT, Retina - OU - Both Eyes    4. Diabetes mellitus treated with injections of non-insulin  medication (HCC)  E11.9    Z79.85     5. Essential hypertension  I10     6. Hypertensive retinopathy of both eyes  H35.033     7. Pseudophakia of both eyes  Z96.1      **ED f/u scheduled to see Dr. Elner on 10.21.25**  1,2. Blunt trauma to left face / eye  - fell while trying to get out of bed and hit face on nightstand on Saturday, 10.4.25  - presented to ED where CT maxillofacial showed no facial or orbital fractures; had left upper eyelid lac repair in ED  - pt reports periorbital bruising and swelling are improving; vision slightly decreased, but also improving  - mild injection / subconj heme -- no other globe injuries  - recommend ice packs to left eyelids to reduce swelling  - no other retinal or ophthalmic interventions indicated or  recommended  - can f/u at [facility] [place] as scheduled for primary eye care  3,4. Proliferative diabetic retinopathy w/o DME, OU (OD > OS)  - A1C 6.7 (07.28.25)  - under the expert management of Dr. Elner - OCT shows OD: Central ORA w/ trace cystic changes, OS: Diffused atrophy, trace ERM - f/u as scheduled with Dr. Elner  5,6. Hypertensive retinopathy OU - discussed importance of tight BP control - monitor   7. Pseudophakia OU  - s/p CE/IOL OU  - IOL in good position, doing well  - monitor   Ophthalmic Meds Ordered this visit:  No orders of the defined types were placed in this encounter.    Return f/u w/ Dr. Elner.  There are no Patient Instructions on file for this visit.  Explained the diagnoses, plan, and follow up with the patient and they expressed understanding.  Patient expressed understanding of the importance of proper follow up care.   This document serves as a record of services personally performed by Redell JUDITHANN Hans, MD, PhD. It was created on their behalf by Wanda GEANNIE Keens, COT an ophthalmic technician. The creation of this record is the provider's dictation and/or activities during the visit.    Electronically signed by:  Wanda GEANNIE Keens, COT  03/18/24 12:20 PM  Redell JUDITHANN Hans, M.D., Ph.D. Diseases & Surgery of the Retina and Vitreous [facility] 03/18/2024  I have reviewed the above documentation for accuracy and completeness, and I agree with the above. Redell JUDITHANN Hans, M.D., Ph.D. 03/18/24 12:28 PM   Abbreviations: M myopia (nearsighted); A astigmatism; H hyperopia (farsighted); P presbyopia; Mrx spectacle prescription;  CTL contact lenses; OD right eye; OS left eye; OU both eyes  XT exotropia; ET esotropia; PEK punctate epithelial keratitis; PEE punctate epithelial erosions; DES dry eye syndrome; MGD meibomian gland dysfunction; ATs artificial tears; PFAT's preservative free artificial tears; NSC nuclear sclerotic  cataract; PSC posterior subcapsular cataract; ERM epi-retinal membrane; PVD posterior vitreous detachment; RD retinal detachment; DM diabetes mellitus; DR diabetic retinopathy; NPDR non-proliferative diabetic retinopathy; PDR proliferative diabetic retinopathy; CSME clinically significant macular edema; DME diabetic macular edema; dbh dot blot hemorrhages; CWS cotton wool spot; POAG primary open angle glaucoma; C/D cup-to-disc ratio; HVF humphrey visual field; GVF goldmann visual field; OCT optical coherence tomography; IOP intraocular pressure; BRVO Branch retinal vein occlusion; CRVO central retinal vein occlusion; CRAO central retinal artery occlusion; BRAO branch retinal artery occlusion; RT retinal tear; SB scleral buckle; PPV pars plana vitrectomy; VH Vitreous hemorrhage; PRP panretinal laser photocoagulation; IVK intravitreal kenalog; VMT vitreomacular traction; MH Macular hole;  NVD neovascularization of the disc; NVE neovascularization elsewhere; AREDS age related eye disease study; ARMD age related macular degeneration; POAG primary open angle glaucoma; EBMD epithelial/anterior basement membrane dystrophy; ACIOL anterior chamber intraocular lens; IOL intraocular lens; PCIOL posterior chamber intraocular lens; Phaco/IOL phacoemulsification with intraocular lens placement; PRK photorefractive keratectomy; LASIK laser assisted in situ keratomileusis; HTN hypertension; DM diabetes mellitus; COPD chronic obstructive pulmonary disease

## 2024-03-27 DIAGNOSIS — E103593 Type 1 diabetes mellitus with proliferative diabetic retinopathy without macular edema, bilateral: Secondary | ICD-10-CM | POA: Diagnosis not present

## 2024-03-27 DIAGNOSIS — H35372 Puckering of macula, left eye: Secondary | ICD-10-CM | POA: Diagnosis not present

## 2024-03-27 DIAGNOSIS — H353132 Nonexudative age-related macular degeneration, bilateral, intermediate dry stage: Secondary | ICD-10-CM | POA: Diagnosis not present

## 2024-03-27 DIAGNOSIS — H401132 Primary open-angle glaucoma, bilateral, moderate stage: Secondary | ICD-10-CM | POA: Diagnosis not present

## 2024-03-27 LAB — OPHTHALMOLOGY REPORT-SCANNED

## 2024-03-28 ENCOUNTER — Encounter: Payer: Self-pay | Admitting: Nurse Practitioner

## 2024-04-02 DIAGNOSIS — H353113 Nonexudative age-related macular degeneration, right eye, advanced atrophic without subfoveal involvement: Secondary | ICD-10-CM | POA: Diagnosis not present

## 2024-04-02 DIAGNOSIS — H35372 Puckering of macula, left eye: Secondary | ICD-10-CM | POA: Diagnosis not present

## 2024-04-02 DIAGNOSIS — E113591 Type 2 diabetes mellitus with proliferative diabetic retinopathy without macular edema, right eye: Secondary | ICD-10-CM | POA: Diagnosis not present

## 2024-04-02 DIAGNOSIS — H353122 Nonexudative age-related macular degeneration, left eye, intermediate dry stage: Secondary | ICD-10-CM | POA: Diagnosis not present

## 2024-04-02 DIAGNOSIS — H35022 Exudative retinopathy, left eye: Secondary | ICD-10-CM | POA: Diagnosis not present

## 2024-04-02 DIAGNOSIS — E113592 Type 2 diabetes mellitus with proliferative diabetic retinopathy without macular edema, left eye: Secondary | ICD-10-CM | POA: Diagnosis not present

## 2024-04-02 LAB — OPHTHALMOLOGY REPORT-SCANNED

## 2024-04-12 ENCOUNTER — Ambulatory Visit (INDEPENDENT_AMBULATORY_CARE_PROVIDER_SITE_OTHER): Admitting: Podiatry

## 2024-04-12 ENCOUNTER — Encounter: Payer: Self-pay | Admitting: Podiatry

## 2024-04-12 DIAGNOSIS — I890 Lymphedema, not elsewhere classified: Secondary | ICD-10-CM | POA: Diagnosis not present

## 2024-04-12 DIAGNOSIS — E1151 Type 2 diabetes mellitus with diabetic peripheral angiopathy without gangrene: Secondary | ICD-10-CM | POA: Diagnosis not present

## 2024-04-12 DIAGNOSIS — Z89432 Acquired absence of left foot: Secondary | ICD-10-CM

## 2024-04-12 NOTE — Progress Notes (Signed)
  Subjective:  Patient ID: Micheal Walls, male    DOB: [birth year],  MRN: [id number]  Chief Complaint  Patient presents with   Diabetes    [facility] Diabetic foot check. Left foot transmet amputation. Swelling in leg. Wears compression socks. NIDDM A1C 6.7.    88 y.o. male presents for diabetic foot check. History confirmed with patient.  He has history of right sided below-knee amputation, left side has had amputation of all toes at the level of the metatarsophalangeal joints.  Patient does have a history of T2DM. Patient does not have callus present at this point.  History of PVD, CVA, CHF, CKD.  Legs appear swollen today, he reports that he does normally wear compression stockings and takes Lasix .  Objective:  Physical Exam: warm, good capillary refill History of amputation to all toes the left foot DP pulse palpable, PT pulse nonpalpable, protective sensation absent, and vibratory sensation diminished, +2 pitting edema left lower extremity.  Skin is indurated. Left Foot: No signs of hyperkeratotic buildup to amputation site.  Well-healed surgical scar.  Left leg edematous.   Right Foot: History of below-knee amputation  Assessment:   1. Type II diabetes mellitus with peripheral circulatory disorder (HCC)   2. Lymphedema of left leg   3. History of transmetatarsal amputation of left foot (HCC)      Plan:  Patient was evaluated and treated and all questions answered.  # Diabetes with peripheral circulatory disorder, history of amputation - Patient educated on diabetes. Discussed proper diabetic foot care and discussed risks and complications of disease. Educated patient in depth on reasons to return to the office immediately should he/she discover anything concerning or new on the feet. All questions answered. Discussed proper shoes as well.  -No recurrence of left foot pressure injury -Patient states he does have toe filler and shoes, primarily uses a wheelchair -No callus at this  point, we can see patient back in 3 months to check on his foot, at this point time if no complications or areas of concern for ulceration can consider pushing patient out to every 6 months for evaluation.  # Lymphedema left lower extremity - Discussed importance of edema control, compliance with medications and use of compression stockings. - He does routinely do a good job of this,, and without compression stockings today due to difficulty reapplying   Return in about 3 months (around 07/13/2024) for diabetic foot check.         Ethan Saddler, DPM [facility] / [facility]

## 2024-04-26 ENCOUNTER — Other Ambulatory Visit: Payer: Self-pay

## 2024-04-26 NOTE — Patient Instructions (Signed)
 Visit Information  Thank you for taking time to visit with me today. Please don't hesitate to contact me if I can be of assistance to you before our next scheduled appointment.  Your next care management appointment is by telephone on 05-27-2024  at 10:00 AM   Telephone follow-up in 1 month  Please call the care guide team at [phone] if you need to cancel, schedule, or reschedule an appointment.   Please call the Suicide and Crisis Lifeline: 988 call the [place]  National Suicide Prevention Lifeline: (431)514-9888 or TTY: 740-354-4819 TTY 913-131-4512) to talk to a trained counselor call 1-800-273-TALK (toll free, 24 hour hotline) go to [facility] [address] [place] [phone]) call 911 if you are experiencing a Mental Health or Behavioral Health Crisis or need someone to talk to. Hendricks Her RN, BSN  [place] [facility] RN Case Information Systems Manager [phone]

## 2024-04-29 DIAGNOSIS — H401132 Primary open-angle glaucoma, bilateral, moderate stage: Secondary | ICD-10-CM | POA: Diagnosis not present

## 2024-04-29 LAB — OPHTHALMOLOGY REPORT-SCANNED

## 2024-05-15 ENCOUNTER — Encounter: Payer: Self-pay | Admitting: Cardiovascular Disease

## 2024-05-24 ENCOUNTER — Other Ambulatory Visit: Payer: Self-pay | Admitting: Nurse Practitioner

## 2024-05-24 DIAGNOSIS — E039 Hypothyroidism, unspecified: Secondary | ICD-10-CM

## 2024-05-27 ENCOUNTER — Other Ambulatory Visit: Payer: Self-pay

## 2024-05-27 NOTE — Patient Outreach (Signed)
 Complex Care Management   Visit Note  05/27/2024  Name:  Micheal Walls MRN: [id number] DOB: [birth year]  Situation: Referral received for Complex Care Management related to hypertension I obtained verbal consent from Patient.  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   Anemia    Aspiration pneumonia (HCC) 11/02/2019   Cataract 2019   Chronic kidney disease 2006   Diastolic heart failure (HCC)    Diverticulosis    DM (diabetes mellitus) (HCC)    GERD (gastroesophageal reflux disease) 1995   Glaucoma    Gout    Hyperlipemia    OSA on CPAP    RBBB    Sepsis (HCC) 11/02/2019   Sleep apnea 2000   Stroke (HCC) 2002   Systemic hypertension    Thyroid  disease 2022    Assessment: Patient Reported Symptoms:  Cognitive Cognitive Status: No symptoms reported Cognitive/Intellectual Conditions Management [RPT]: None reported or documented in medical history or problem list   Health Maintenance Behaviors: Sports Healing Pattern: Average Health Facilitated by: Healthy diet, Rest  Neurological Neurological Review of Symptoms: No symptoms reported Neurological Management Strategies: Adequate rest, Routine screening Neurological Self-Management Outcome: 4 (good)  HEENT HEENT Symptoms Reported: Tearing HEENT Management Strategies: Medication therapy HEENT Self-Management Outcome: 4 (good) HEENT Comment: Eye appointment 05/28/2024    Cardiovascular Cardiovascular Symptoms Reported: Swelling in legs or feet (Left Leg swelling   elevates) Does patient have uncontrolled Hypertension?: Yes Is patient checking Blood Pressure at home?: Yes Patient's Recent BP reading at home: 145/87 Cardiovascular Management Strategies: Adequate rest, Coping strategies Cardiovascular Self-Management Outcome: 3 (uncertain)  Respiratory      Endocrine Endocrine  Symptoms Reported: No symptoms reported Is patient diabetic?: Yes Is patient checking blood sugars at home?: Yes List most recent blood sugar readings, include date and time of day: Fasting 90 this am Endocrine Self-Management Outcome: 4 (good)  Gastrointestinal Gastrointestinal Symptoms Reported: No symptoms reported Gastrointestinal Management Strategies: Adequate rest, Medication therapy Gastrointestinal Self-Management Outcome: 4 (good)    Genitourinary Genitourinary Symptoms Reported: No symptoms reported Genitourinary Management Strategies: Adequate rest, Coping strategies Genitourinary Self-Management Outcome: 4 (good)  Integumentary Integumentary Symptoms Reported: No symptoms reported Skin Management Strategies: Coping strategies Skin Self-Management Outcome: 4 (good)  Musculoskeletal Musculoskelatal Symptoms Reviewed: Difficulty walking Additional Musculoskeletal Details: Wheelchair Musculoskeletal Management Strategies: Coping strategies, Medical device Musculoskeletal Self-Management Outcome: 4 (good) Falls in the past year?: Yes Number of falls in past year: 1 or less Was there an injury with Fall?: Yes (October- eye laceration  No falls since) Fall Risk Category Calculator: 2 Patient Fall Risk Level: Moderate Fall Risk Patient at Risk for Falls Due to: History of fall(s), Impaired mobility Fall risk Follow up: Falls evaluation completed, Education provided, Falls prevention discussed  Psychosocial Psychosocial Symptoms Reported: No symptoms reported Behavioral Management Strategies: Coping strategies Behavioral Health Self-Management Outcome: 4 (good) Major Change/Loss/Stressor/Fears (CP): Denies Techniques to Cope with Loss/Stress/Change: None Quality of Family Relationships: helpful, supportive, involved Do you feel physically threatened by others?: No    05/27/2024    PHQ2-9 Depression Screening   Little interest or pleasure in doing things Not at all  Feeling  down, depressed, or hopeless Not at all  PHQ-2 - Total Score 0  Trouble falling or staying asleep, or sleeping too much    Feeling tired or having little energy    Poor  appetite or overeating     Feeling bad about yourself - or that you are a failure or have let yourself or your family down    Trouble concentrating on things, such as reading the newspaper or watching television    Moving or speaking so slowly that other people could have noticed.  Or the opposite - being so fidgety or restless that you have been moving around a lot more than usual    Thoughts that you would be better off dead, or hurting yourself in some way    PHQ2-9 Total Score    If you checked off any problems, how difficult have these problems made it for you to do your work, take care of things at home, or get along with other people    Depression Interventions/Treatment      Today's Vitals   05/27/24 1003  BP: (!) 145/87  Pulse: 79   Pain Score: 0-No pain  Medications Reviewed Today     Reviewed by Kay Hendricks MATSU, RN (Case Manager) on 05/27/24 at 1018  Med List Status: <None>   Medication Order Taking? Sig Documenting Provider Last Dose Status Informant  allopurinol  (ZYLOPRIM ) 100 MG tablet [id number] Yes Take 100 mg by mouth daily. [provider]  Active Pharmacy Records, Self           Med Note [name] Oct 22, 2020  9:51 AM)    aspirin  EC 81 MG tablet [id number] Yes Take 81 mg by mouth daily. Swallow whole. [provider]  Active Pharmacy Records, Self  colchicine  0.6 MG tablet [id number] Yes Take 0.6 mg by mouth once a week. [provider]  Active Pharmacy Records, Self           Med Note (MARROW, [name]   Mon Dec 11, 2023 10:09 AM) [name]   Continuous Glucose Sensor (FREESTYLE LIBRE 14 DAY SENSOR) [name] [id number] Yes USE AS DIRECTED TO CHECK BLOOD SUGARS. CHANGE EVERY 14 DAYS [name], [name]  Active   diltiazem  (CARDIZEM  CD) 180 MG 24 hr capsule [id number] Yes Take 1 capsule (180 mg total) by mouth daily. [name], [name]  Active   dorzolamide  (TRUSOPT ) 2 % ophthalmic solution [id number] Yes Place 1 drop into both eyes 2 (two) times daily. [provider]  Active Pharmacy Records, Self  ferrous sulfate  325 (65 FE) MG tablet [id number] Yes Take 1 tablet (325 mg total) by mouth 2 (two) times daily with a meal. [name], [name], MD  Active Pharmacy Records, Self  furosemide  (LASIX ) 40 MG tablet [id number] Yes TAKE ONE (1) TABLET BY MOUTH TWICE DAILY Moore, Janece, [name]  Active   gabapentin  (NEURONTIN ) 100 MG capsule [id number] Yes TAKE TWO (2) CAPSULES BY MOUTH IN THE MORNING, TAKE 1 CAPSULE AT NOON AND 1 CAPSULE EVERY KARNA [name], [name]  Active   glucose blood (FREESTYLE PRECISION NEO TEST) test strip [id number] Yes Use as instructed [name], [name]  Active Pharmacy Records, Self  guaiFENesin  (ROBITUSSIN) 100 MG/5ML liquid [id number] Yes Take 10 mLs by mouth every 8 (eight) hours as needed for cough or to loosen phlegm. [name], MD  Active   latanoprost  (XALATAN ) 0.005 % ophthalmic solution [id number] Yes Place 1 drop into both eyes at bedtime.  [provider]  Active Pharmacy Records, Self           Med Note (SUTPHIN,  CHASIE F   Thu Oct 22, 2020 10:00 AM)    Multiple Vitamins-Minerals (PRESERVISION AREDS) CAPS [id number] Yes Take 2 capsules by mouth daily. [provider]  Active Pharmacy Records, Self  pantoprazole  (PROTONIX ) 40 MG tablet [id number] Yes TAKE 1 TABLET BY MOUTH EVERY DAY AT BEDTIME [name], [name]  Active   rosuvastatin  (CRESTOR ) 20 MG tablet [id number] Yes TAKE 1 TABLET (20MG  TOTAL) BY MOUTH AT BEDTIME [name], [name], MD  Active Self, Pharmacy Records  Semaglutide , 2 MG/DOSE, (OZEMPIC , 2 MG/DOSE,) 8 MG/3ML SOPN [id number] Yes INJECT 2 MG INTO THE SKIN ONCE A WEEK. (DX: E11.22) [name], [name]  Active    senna-docusate (SENOKOT-S) 8.6-50 MG tablet [id number] Yes Take 1 tablet by mouth 2 (two) times daily between meals as needed for mild constipation or moderate constipation. [name], [name], MD  Active Pharmacy Records, Self  SYNTHROID  25 MCG tablet [id number] Yes Take 1 tablet by mouth Monday - Friday [name], [name]  Active Self, Pharmacy Records  tamsulosin  (FLOMAX ) 0.4 MG CAPS capsule [id number] Yes Take 0.4 mg by mouth daily. [provider]  Active Pharmacy Records, Self            Recommendation:   Limit sodium intake to 1500 MG  daily   Follow Up Plan:   Telephone follow-up in 1 month  Hendricks Her RN, BSN  [place] [facility] RN Case Information Systems Manager [phone]

## 2024-05-27 NOTE — Patient Instructions (Signed)
 Visit Information  Thank you for taking time to visit with me today. Please don't hesitate to contact me if I can be of assistance to you before our next scheduled appointment.  Your next care management appointment is by telephone on 06/27/2024 at 10:00 AM   Telephone follow-up in 1 month  Please call the care guide team at [phone] if you need to cancel, schedule, or reschedule an appointment.   Please call the Suicide and Crisis Lifeline: 988 call the [place]  National Suicide Prevention Lifeline: (224)221-8089 or TTY: (380) 374-5838 TTY (865)885-0206) to talk to a trained counselor call 1-800-273-TALK (toll free, 24 hour hotline) go to [facility] [address] [place] [phone]) call 911 if you are experiencing a Mental Health or Behavioral Health Crisis or need someone to talk to.  Hendricks Her RN, BSN  [place] [facility] RN Case Information Systems Manager [phone]

## 2024-05-29 DIAGNOSIS — H401132 Primary open-angle glaucoma, bilateral, moderate stage: Secondary | ICD-10-CM | POA: Diagnosis not present

## 2024-05-29 LAB — OPHTHALMOLOGY REPORT-SCANNED

## 2024-06-22 ENCOUNTER — Encounter: Payer: Self-pay | Admitting: Nurse Practitioner

## 2024-06-26 ENCOUNTER — Ambulatory Visit (INDEPENDENT_AMBULATORY_CARE_PROVIDER_SITE_OTHER): Payer: Self-pay

## 2024-06-26 DIAGNOSIS — Z0000 Encounter for general adult medical examination without abnormal findings: Secondary | ICD-10-CM

## 2024-06-26 NOTE — Patient Instructions (Signed)
 [name],  Thank you for taking the time for your Medicare Wellness Visit. I appreciate your continued commitment to your health goals. Please review the care plan we discussed, and feel free to reach out if I can assist you further.  Please note that Annual Wellness Visits do not include a physical exam. Some assessments may be limited, especially if the visit was conducted virtually. If needed, we may recommend an in-person follow-up with your provider.  Ongoing Care Seeing your primary care provider every 3 to 6 months helps us  monitor your health and provide consistent, personalized care.   Referrals If a referral was made during today's visit and you haven't received any updates within two weeks, please contact the referred provider directly to check on the status.  Recommended Screenings:  Health Maintenance  Topic Date Due   Medicare Annual Wellness Visit  05/27/2023   Complete foot exam   11/24/2023   Flu Shot  01/12/2024   COVID-19 Vaccine (7 - 2025-26 season) 02/12/2024   Hemoglobin A1C  07/10/2024   Eye exam for diabetics  05/29/2025   DTaP/Tdap/Td vaccine (3 - Td or Tdap) 03/16/2034   Pneumococcal Vaccine for age over 72  Completed   Zoster (Shingles) Vaccine  Completed   Meningitis B Vaccine  Aged Out       06/22/2024    4:53 PM  Advanced Directives  Does Patient Have a Medical Advance Directive? No  Would patient like information on creating a medical advance directive? No - Patient declined    Vision: Annual vision screenings are recommended for early detection of glaucoma, cataracts, and diabetic retinopathy. These exams can also reveal signs of chronic conditions such as diabetes and high blood pressure.  Dental: Annual dental screenings help detect early signs of oral cancer, gum disease, and other conditions linked to overall health, including heart disease and diabetes.  Please see the attached documents for additional preventive care recommendations.

## 2024-06-26 NOTE — Progress Notes (Signed)
 "  Chief Complaint  Patient presents with   Medicare Wellness     Subjective:   Micheal Walls is a 89 y.o. male who presents for a Medicare Annual Wellness Visit.  Visit info / Clinical Intake: Medicare Wellness Visit Type:: Subsequent Annual Wellness Visit Persons participating in visit and providing information:: patient Medicare Wellness Visit Mode:: Telephone If telephone:: video declined Since this visit was completed virtually, some vitals may be partially provided or unavailable. Missing vitals are due to the limitations of the virtual format.: Unable to obtain vitals - no equipment If Telephone or Video please confirm:: I connected with patient using audio/video enable telemedicine. I verified patient identity with two identifiers, discussed telehealth limitations, and patient agreed to proceed. Patient Location:: home Provider Location:: office Interpreter Needed?: No Pre-visit prep was completed: yes AWV questionnaire completed by patient prior to visit?: yes Date:: 06/22/24 Living arrangements:: (Patient-Rptd) lives with spouse/significant other Patient's Overall Health Status Rating: (!) (Patient-Rptd) fair Typical amount of pain: (Patient-Rptd) none Does pain affect daily life?: (Patient-Rptd) no  Dietary Habits and Nutritional Risks How many meals a day?: (Patient-Rptd) 2 Eats fruit and vegetables daily?: (!) (Patient-Rptd) no Most meals are obtained by: (Patient-Rptd) preparing own meals; having others provide food In the last 2 weeks, have you had any of the following?: none  Functional Status Activities of Daily Living (to include ambulation/medication): (Patient-Rptd) Independent Ambulation: Independent with device- listed below Home Assistive Devices/Equipment: Wheelchair (motorized) Medication Administration: (Patient-Rptd) Independent Home Management (perform basic housework or laundry): (Patient-Rptd) Needs assistance (comment) Manage your own  finances?: (!) (Patient-Rptd) no Primary transportation is: (Patient-Rptd) family / friends; facility / other Concerns about vision?: no *vision screening is required for WTM* Concerns about hearing?: (!) yes Uses hearing aids?: (!) yes  Fall Screening Falls in the past year?: 1 (fell out of bed) Number of falls in past year: (Patient-Rptd) 0 Was there an injury with Fall?: 1 (cut open eye) Fall Risk Category Calculator: (Patient-Rptd) 2 Patient Fall Risk Level: (Patient-Rptd) Moderate Fall Risk  Fall Risk Patient at Risk for Falls Due to: Impaired mobility; Medication side effect Fall risk Follow up: Falls evaluation completed; Falls prevention discussed  Home and Transportation Safety: All rugs have non-skid backing?: (Patient-Rptd) N/A, no rugs All stairs or steps have railings?: N/A, no stairs (has a ramp) Grab bars in the bathtub or shower?: (Patient-Rptd) yes Have non-skid surface in bathtub or shower?: (Patient-Rptd) yes Good home lighting?: (Patient-Rptd) yes Regular seat belt use?: (Patient-Rptd) yes Hospital stays in the last year:: (Patient-Rptd) no  Cognitive Assessment Difficulty concentrating, remembering, or making decisions? : (Patient-Rptd) no Will 6CIT or Mini Cog be Completed: yes What year is it?: 0 points What month is it?: 0 points Give patient an address phrase to remember (5 components): [address] [place] About what time is it?: 3 points Count backwards from 20 to 1: 0 points Say the months of the year in reverse: 0 points Repeat the address phrase from earlier: 6 points 6 CIT Score: 9 points  Advance Directives (For Healthcare) Does Patient Have a Medical Advance Directive?: No Does patient want to make changes to medical advance directive?: No - Patient declined Type of Advance Directive: Healthcare Power of Attorney Would patient like information on creating a medical advance directive?: No - Patient declined  Reviewed/Updated   Reviewed/Updated: Reviewed All (Medical, Surgical, Family, Medications, Allergies, Care Teams, Patient Goals)    Allergies (verified) Vancomycin    Current Medications (verified) Outpatient Encounter Medications as  of 06/26/2024  Medication Sig   allopurinol  (ZYLOPRIM ) 100 MG tablet Take 100 mg by mouth daily.   aspirin  EC 81 MG tablet Take 81 mg by mouth daily. Swallow whole.   colchicine  0.6 MG tablet Take 0.6 mg by mouth once a week.   Continuous Glucose Sensor (FREESTYLE LIBRE 14 DAY SENSOR) MISC USE AS DIRECTED TO CHECK BLOOD SUGARS. CHANGE EVERY 14 DAYS   diltiazem  (CARDIZEM  CD) 180 MG 24 hr capsule Take 1 capsule (180 mg total) by mouth daily.   dorzolamide  (TRUSOPT ) 2 % ophthalmic solution Place 1 drop into both eyes 2 (two) times daily.   ferrous sulfate  325 (65 FE) MG tablet Take 1 tablet (325 mg total) by mouth 2 (two) times daily with a meal.   furosemide  (LASIX ) 40 MG tablet TAKE ONE (1) TABLET BY MOUTH TWICE DAILY   gabapentin  (NEURONTIN ) 100 MG capsule TAKE TWO (2) CAPSULES BY MOUTH IN THE MORNING, TAKE 1 CAPSULE AT NOON AND 1 CAPSULE EVERY EVENING   glucose blood (FREESTYLE PRECISION NEO TEST) test strip Use as instructed   guaiFENesin  (ROBITUSSIN) 100 MG/5ML liquid Take 10 mLs by mouth every 8 (eight) hours as needed for cough or to loosen phlegm.   latanoprost  (XALATAN ) 0.005 % ophthalmic solution Place 1 drop into both eyes at bedtime.    Multiple Vitamins-Minerals (PRESERVISION AREDS) CAPS Take 2 capsules by mouth daily.   pantoprazole  (PROTONIX ) 40 MG tablet TAKE 1 TABLET BY MOUTH EVERY DAY AT BEDTIME   rosuvastatin  (CRESTOR ) 20 MG tablet TAKE 1 TABLET (20MG  TOTAL) BY MOUTH AT BEDTIME   Semaglutide , 2 MG/DOSE, (OZEMPIC , 2 MG/DOSE,) 8 MG/3ML SOPN INJECT 2 MG INTO THE SKIN ONCE A WEEK. (DX: E11.22)   senna-docusate (SENOKOT-S) 8.6-50 MG tablet Take 1 tablet by mouth 2 (two) times daily between meals as needed for mild constipation or moderate constipation.   SYNTHROID  25  MCG tablet TAKE 1 TABLET BY MOUTH MONDAY-FRIDAY   tamsulosin  (FLOMAX ) 0.4 MG CAPS capsule Take 0.4 mg by mouth daily.   No facility-administered encounter medications on file as of 06/26/2024.    History: Past Medical History:  Diagnosis Date   Acute metabolic encephalopathy 01/16/2020   Amputated below knee (HCC)    right   Anemia    Aspiration pneumonia (HCC) 11/02/2019   Cataract 2019   Chronic kidney disease 2006   Diastolic heart failure (HCC)    Diverticulosis    DM (diabetes mellitus) (HCC)    GERD (gastroesophageal reflux disease) 1995   Glaucoma    Gout    Hyperlipemia    OSA on CPAP    RBBB    Sepsis (HCC) 11/02/2019   Sleep apnea 2000   Stroke [facility]) 2002   Systemic hypertension    Thyroid  disease 2022   Past Surgical History:  Procedure Laterality Date   ABDOMINAL AORTOGRAM W/LOWER EXTREMITY Left 08/11/2020   Procedure: ABDOMINAL AORTOGRAM W/LOWER EXTREMITY;  Surgeon: Serene Gaile ORN, MD;  Location: [facility];  Service: Cardiovascular;  Laterality: Left;   ABDOMINAL AORTOGRAM W/LOWER EXTREMITY N/A 02/26/2021   Procedure: ABDOMINAL AORTOGRAM W/LOWER EXTREMITY;  Surgeon: Magda Debby SAILOR, MD;  Location: [facility];  Service: Cardiovascular;  Laterality: N/A;   AMPUTATION Left 09/14/2020   Procedure: AMPUTATION LEFT GREAT TOE;  Surgeon: Silva Juliene SAUNDERS, DPM;  Location: [facility] OR;  Service: Podiatry;  Laterality: Left;   AMPUTATION Left 02/07/2023   Procedure: AMPUTATION TOE 3RD,4TH, and 5TH;  Surgeon: Tobie Franky SQUIBB, DPM;  Location: [facility] OR;  Service: Orthopedics/Podiatry;  Laterality: Left;   AMPUTATION TOE Left 12/03/2021   Procedure: AMPUTATION TOE, LEFT second;  Surgeon: Joya Stabs, DPM;  Location: [facility] OR;  Service: Podiatry;  Laterality: Left;   EYE SURGERY  May & July 2019   One eye at a time   LEG AMPUTATION BELOW KNEE     right   NM MYOCAR PERF WALL MOTION  09/18/2009   no ischemia   PERIPHERAL VASCULAR BALLOON ANGIOPLASTY Left  08/11/2020   Procedure: PERIPHERAL VASCULAR BALLOON ANGIOPLASTY;  Surgeon: Serene Gaile ORN, MD;  [facility]: [facility];  Service: Cardiovascular;  Laterality: Left;  AT   PERIPHERAL VASCULAR INTERVENTION Left 02/26/2021   Procedure: PERIPHERAL VASCULAR INTERVENTION;  Surgeon: Magda Debby SAILOR, MD;  [facility];  Service: Cardiovascular;  Laterality: Left;   Family History  Problem Relation Age of Onset   Diabetes Mother    Heart attack Father    Cancer Sister    Asthma Daughter    Social History   Occupational History   Occupation: retired  Tobacco Use   Smoking status: Former    Current packs/day: 0.00    Average packs/day: 1 pack/day for 25.0 years (25.0 ttl pk-yrs)    Types: Cigars, Cigarettes    Quit date: 06/12/2001    Years since quitting: 23.0   Smokeless tobacco: Never  Vaping Use   Vaping status: Former  Substance and Sexual Activity   Alcohol  use: No   Drug use: No   Sexual activity: Not Currently    Birth control/protection: None   Tobacco Counseling Counseling given: Not Answered  SDOH Screenings   Food Insecurity: No Food Insecurity (06/22/2024)  Housing: Low Risk (06/22/2024)  Transportation Needs: No Transportation Needs (06/22/2024)  Utilities: Not At Risk (06/26/2024)  Alcohol  Screen: Low Risk (06/22/2024)  Depression (PHQ2-9): Low Risk (06/26/2024)  Financial Resource Strain: Low Risk (06/22/2024)  Physical Activity: Inactive (06/22/2024)  Social Connections: Moderately Isolated (06/22/2024)  Stress: No Stress Concern Present (06/22/2024)  Tobacco Use: Medium Risk (06/26/2024)  Health Literacy: Adequate Health Literacy (06/26/2024)   See flowsheets for full screening details  Depression Screen PHQ 2 & 9 Depression Scale- Over the past 2 weeks, how often have you been bothered by any of the following problems? Little interest or pleasure in doing things: 0 Feeling down, depressed, or hopeless (PHQ Adolescent also  includes...irritable): 0 PHQ-2 Total Score: 0 Trouble falling or staying asleep, or sleeping too much: 3 Feeling tired or having little energy: 0 Poor appetite or overeating (PHQ Adolescent also includes...weight loss): 0 Feeling bad about yourself - or that you are a failure or have let yourself or your family down: 0 Trouble concentrating on things, such as reading the newspaper or watching television (PHQ Adolescent also includes...like school work): 0 Moving or speaking so slowly that other people could have noticed. Or the opposite - being so fidgety or restless that you have been moving around a lot more than usual: 0 Thoughts that you would be better off dead, or of hurting yourself in some way: 0 PHQ-9 Total Score: 3 If you checked off any problems, how difficult have these problems made it for you to do your work, take care of things at home, or get along with other people?: Not difficult at all  Depression Treatment Depression Interventions/Treatment : EYV7-0 Score <4 Follow-up Not Indicated     Goals Addressed             This Visit's Progress  Patient Stated       06/26/2024, stay healthy             Objective:    Today's Vitals   There is no height or weight on file to calculate BMI.  Hearing/Vision screen Hearing Screening - Comments:: Has a hearing aid that is maintained Vision Screening - Comments:: Regular eye exams Immunizations and Health Maintenance Health Maintenance  Topic Date Due   FOOT EXAM  11/24/2023   Influenza Vaccine  01/12/2024   COVID-19 Vaccine (7 - 2025-26 season) 02/12/2024   HEMOGLOBIN A1C  07/10/2024   OPHTHALMOLOGY EXAM  05/29/2025   Medicare Annual Wellness (AWV)  06/26/2025   DTaP/Tdap/Td (3 - Td or Tdap) 03/16/2034   Pneumococcal Vaccine: 50+ Years  Completed   Zoster Vaccines- Shingrix   Completed   Meningococcal B Vaccine  Aged Out        Assessment/Plan:  This is a routine wellness examination for  Antoine.  Patient Care Team: [name], [name] as PCP - General (General Practice) [name], Jerel, MD as PCP - Cardiology (Cardiology) Arnell Rockney PARAS, RPH (Inactive) (Pharmacist) Kay Hendricks MATSU, RN as [facility] Care Management Rankin, Arley LABOR, MD as Consulting Physician (Ophthalmology) Cleatus Collar, MD as Consulting Physician (Ophthalmology) Melia Lynwood ORN, MD as Attending Physician (Nephrology)  I have personally reviewed and noted the following in the patients chart:   Medical and social history Use of alcohol , tobacco or illicit drugs  Current medications and supplements including opioid prescriptions. Functional ability and status Nutritional status Physical activity Advanced directives List of other physicians Hospitalizations, surgeries, and ER visits in previous 12 months Vitals Screenings to include cognitive, depression, and falls Referrals and appointments  No orders of the defined types were placed in this encounter.  In addition, I have reviewed and discussed with patient certain preventive protocols, quality metrics, and best practice recommendations. A written personalized care plan for preventive services as well as general preventive health recommendations were provided to patient.   Ardella FORBES Dawn, LPN   8/85/7973   Return in 1 year (on 06/26/2025).  After Visit Summary: (MyChart) Due to this being a telephonic visit, the after visit summary with patients personalized plan was offered to patient via MyChart   Nurse Notes: HM Addressed: Vaccines Due: flu and covid Diabetic Foot Exam recommended  "

## 2024-06-27 ENCOUNTER — Other Ambulatory Visit: Payer: Self-pay

## 2024-06-27 NOTE — Patient Instructions (Signed)
 Visit Information  Thank you for taking time to visit with me today. Please don't hesitate to contact me if I can be of assistance to you before our next scheduled appointment.  Your next care management appointment is by telephone on 07/29/2024 at 10:30 AM   Telephone follow-up in 1 month  Please call the care guide team at [phone] if you need to cancel, schedule, or reschedule an appointment.   Please call the Suicide and Crisis Lifeline: 988 call the [place]  National Suicide Prevention Lifeline: 418 389 0568 or TTY: 269-097-5973 TTY 808-373-2870) to talk to a trained counselor call 1-800-273-TALK (toll free, 24 hour hotline) go to [facility] [address], [place] [phone]) call 911 if you are experiencing a Mental Health or Behavioral Health Crisis or need someone to talk to.  Hendricks Her RN, BSN  [place] VBCI-Population Health RN Case Information Systems Manager [phone]

## 2024-06-27 NOTE — Patient Outreach (Signed)
 Complex Care Management   Visit Note  06/27/2024  Name:  Micheal Walls MRN: [id number] DOB: 08/06/1935  Situation: Referral received for Complex Care Management related to hypertension I obtained verbal consent from Patient.  Visit completed with Patient  on the phone  Background:   Past Medical History:  Diagnosis Date   Acute metabolic encephalopathy 01/16/2020   Amputated below knee [facility])    right   Anemia    Aspiration pneumonia (HCC) 11/02/2019   Cataract 2019   Chronic kidney disease 2006   Diastolic heart failure (HCC)    Diverticulosis    DM (diabetes mellitus) (HCC)    GERD (gastroesophageal reflux disease) 1995   Glaucoma    Gout    Hyperlipemia    OSA on CPAP    RBBB    Sepsis (HCC) 11/02/2019   Sleep apnea 2000   Stroke (HCC) 2002   Systemic hypertension    Thyroid  disease 2022    Assessment: Patient Reported Symptoms:  Cognitive Cognitive Status: No symptoms reported Cognitive/Intellectual Conditions Management [RPT]: None reported or documented in medical history or problem list   Health Maintenance Behaviors: Sports Healing Pattern: Average Health Facilitated by: Healthy diet, Rest  Neurological Neurological Review of Symptoms: No symptoms reported Neurological Management Strategies: Adequate rest, Routine screening Neurological Self-Management Outcome: 4 (good)  HEENT HEENT Symptoms Reported: No symptoms reported HEENT Management Strategies: Medication therapy HEENT Self-Management Outcome: 4 (good)    Cardiovascular Does patient have uncontrolled Hypertension?: Yes Is patient checking Blood Pressure at home?: Yes Patient's Recent BP reading at home: 136/64 today   My feet are doing good  swelling went down a little  Cardiovascular Management Strategies: Adequate rest, Coping strategies, Routine screening Weight: 267 lb (121.1 kg) Cardiovascular Self-Management Outcome: 4 (good)  Respiratory      Endocrine Endocrine Symptoms Reported:  No symptoms reported Is patient diabetic?: Yes Is patient checking blood sugars at home?: Yes List most recent blood sugar readings, include date and time of day: 112 Fasting today 06/27/2024 Endocrine Self-Management Outcome: 4 (good)  Gastrointestinal Gastrointestinal Symptoms Reported: No symptoms reported Gastrointestinal Management Strategies: Adequate rest, Medication therapy Gastrointestinal Self-Management Outcome: 4 (good)    Genitourinary Genitourinary Symptoms Reported: No symptoms reported Genitourinary Management Strategies: Adequate rest, Coping strategies Genitourinary Self-Management Outcome: 4 (good)  Integumentary Integumentary Symptoms Reported: No symptoms reported Skin Management Strategies: Coping strategies Skin Self-Management Outcome: 4 (good)  Musculoskeletal Musculoskelatal Symptoms Reviewed: Difficulty walking Additional Musculoskeletal Details: Wheelchair Musculoskeletal Management Strategies: Coping strategies, Medical device Musculoskeletal Self-Management Outcome: 4 (good) Musculoskeletal Comment: Prosthetic leg had to be remade  Patient states he used 1st one for 2-3 weeks. After return visit it didn't fit  Now waiting for new prosthetic Falls in the past year?: Yes Number of falls in past year: 1 or less Was there an injury with Fall?: Yes Fall Risk Category Calculator: 2 Patient Fall Risk Level: Moderate Fall Risk Patient at Risk for Falls Due to: Impaired mobility Fall risk Follow up: Falls evaluation completed, Falls prevention discussed  Psychosocial Psychosocial Symptoms Reported: No symptoms reported Behavioral Management Strategies: Coping strategies Behavioral Health Self-Management Outcome: 4 (good) Major Change/Loss/Stressor/Fears (CP): Denies Techniques to Cope with Loss/Stress/Change: None Quality of Family Relationships: helpful, involved, supportive Do you feel physically threatened by others?: No    06/27/2024    PHQ2-9 Depression  Screening   Little interest or pleasure in doing things Not at all  Feeling down, depressed, or hopeless Not at all  PHQ-2 - Total Score 0  Trouble falling or staying asleep, or sleeping too much    Feeling tired or having little energy    Poor appetite or overeating     Feeling bad about yourself - or that you are a failure or have let yourself or your family down    Trouble concentrating on things, such as reading the newspaper or watching television    Moving or speaking so slowly that other people could have noticed.  Or the opposite - being so fidgety or restless that you have been moving around a lot more than usual    Thoughts that you would be better off dead, or hurting yourself in some way    PHQ2-9 Total Score    If you checked off any problems, how difficult have these problems made it for you to do your work, take care of things at home, or get along with other people    Depression Interventions/Treatment      Today's Vitals   06/27/24 1025  BP: 136/64  Pulse: 67  Weight: 267 lb (121.1 kg)   Pain Score: 0-No pain  Medications Reviewed Today     Reviewed by Kay Hendricks MATSU, RN (Case Manager) on 06/27/24 at 1006  Med List Status: <None>   Medication Order Taking? Sig Documenting Provider Last Dose Status Informant  allopurinol  (ZYLOPRIM ) 100 MG tablet [id number] Yes Take 100 mg by mouth daily. [provider]  Active Pharmacy Records, Self           Med Note [name] Oct 22, 2020  9:51 AM)    aspirin  EC 81 MG tablet [id number] Yes Take 81 mg by mouth daily. Swallow whole. [provider]  Active Pharmacy Records, Self  colchicine  0.6 MG tablet [id number] Yes Take 0.6 mg by mouth once a week. [provider]  Active Pharmacy Records, Self           Med Note (MARROW, [name]   Mon Dec 11, 2023 10:09 AM) [name]   Continuous Glucose Sensor (FREESTYLE  LIBRE 14 DAY SENSOR) [name] [id number] Yes USE AS DIRECTED TO CHECK BLOOD SUGARS. CHANGE EVERY 14 DAYS [name], [name]  Active   diltiazem  (CARDIZEM  CD) 180 MG 24 hr capsule [id number] Yes Take 1 capsule (180 mg total) by mouth daily. [name], [name]  Active   dorzolamide  (TRUSOPT ) 2 % ophthalmic solution [id number] Yes Place 1 drop into both eyes 2 (two) times daily. [provider]  Active Pharmacy Records, Self  ferrous sulfate  325 (65 FE) MG tablet [id number] Yes Take 1 tablet (325 mg total) by mouth 2 (two) times daily with a meal. [name], [name], MD  Active Pharmacy Records, Self  furosemide  (LASIX ) 40 MG tablet [id number] Yes TAKE ONE (1) TABLET BY MOUTH TWICE DAILY Moore, Janece, [name]  Active   gabapentin  (NEURONTIN ) 100 MG capsule [id number] Yes TAKE TWO (2) CAPSULES BY MOUTH IN THE MORNING, TAKE 1 CAPSULE AT NOON AND 1 CAPSULE EVERY KARNA [name], [name]  Active   glucose blood (FREESTYLE PRECISION NEO TEST) test strip [id number] Yes Use as instructed [name], [name]  Active Pharmacy Records, Self  guaiFENesin  [facility]) 100 MG/5ML liquid [id number] Yes Take 10 mLs by mouth every 8 (eight) hours as needed for cough or to loosen phlegm. [name], MD  Active   latanoprost  (XALATAN ) 0.005 % ophthalmic solution [id number] Yes Place 1 drop  into both eyes at bedtime.  [provider]  Active Pharmacy Records, Self           Med Note [name] Oct 22, 2020 10:00 AM)    Multiple Vitamins-Minerals (PRESERVISION AREDS) CAPS [id number] Yes Take 2 capsules by mouth daily. [provider]  Active Pharmacy Records, Self  pantoprazole  (PROTONIX ) 40 MG tablet [id number] Yes TAKE 1 TABLET BY MOUTH EVERY DAY AT BEDTIME [name], [name]  Active   rosuvastatin  (CRESTOR ) 20 MG tablet [id number] Yes TAKE 1 TABLET (20MG  TOTAL) BY MOUTH AT BEDTIME [name], [name], MD  Active Self, Pharmacy Records  Semaglutide , 2 MG/DOSE, (OZEMPIC , 2 MG/DOSE,) 8 MG/3ML  SOPN [id number] Yes INJECT 2 MG INTO THE SKIN ONCE A WEEK. (DX: E11.22) [name], [name]  Active   senna-docusate (SENOKOT-S) 8.6-50 MG tablet [id number] Yes Take 1 tablet by mouth 2 (two) times daily between meals as needed for mild constipation or moderate constipation. [name], [name], MD  Active Pharmacy Records, Self  SYNTHROID  25 MCG tablet [id number] Yes TAKE 1 TABLET BY MOUTH MONDAY-FRIDAY Moore, Janece, [name]  Active   tamsulosin  (FLOMAX ) 0.4 MG CAPS capsule [id number] Yes Take 0.4 mg by mouth daily. [provider]  Active Pharmacy Records, Self            Recommendation:   Continue Current Plan of Care  Follow Up Plan:   Telephone follow-up in 1 month  Hendricks Her RN, BSN  [place] VBCI-Population Health RN Case Information Systems Manager [phone]

## 2024-06-28 ENCOUNTER — Other Ambulatory Visit: Payer: Self-pay | Admitting: Nurse Practitioner

## 2024-07-12 ENCOUNTER — Ambulatory Visit: Admitting: Podiatry

## 2024-07-12 DIAGNOSIS — E1151 Type 2 diabetes mellitus with diabetic peripheral angiopathy without gangrene: Secondary | ICD-10-CM | POA: Diagnosis not present

## 2024-07-12 DIAGNOSIS — Z89511 Acquired absence of right leg below knee: Secondary | ICD-10-CM | POA: Diagnosis not present

## 2024-07-12 DIAGNOSIS — I890 Lymphedema, not elsewhere classified: Secondary | ICD-10-CM

## 2024-07-12 DIAGNOSIS — Z89432 Acquired absence of left foot: Secondary | ICD-10-CM | POA: Diagnosis not present

## 2024-07-12 NOTE — Progress Notes (Unsigned)
 Left foot doing well 48-month follow-up Left calf circumference 39.5, ankle circumference 34.5 cm  Once new compression stocking prescription because the ones that he has been tearing up to heavy.  Will contact patient with new rx info

## 2024-07-16 ENCOUNTER — Encounter: Payer: Self-pay | Admitting: Podiatry

## 2024-07-16 LAB — LAB REPORT - SCANNED: EGFR: 40

## 2024-07-18 ENCOUNTER — Encounter: Payer: Self-pay | Admitting: Nurse Practitioner

## 2024-07-24 ENCOUNTER — Ambulatory Visit: Payer: Self-pay | Admitting: Nurse Practitioner

## 2024-07-29 ENCOUNTER — Telehealth

## 2024-11-06 ENCOUNTER — Inpatient Hospital Stay

## 2024-11-08 ENCOUNTER — Ambulatory Visit: Admitting: Podiatry

## 2024-11-13 ENCOUNTER — Inpatient Hospital Stay: Admitting: Physician Assistant

## 2025-07-02 ENCOUNTER — Ambulatory Visit: Payer: Self-pay
